# Patient Record
Sex: Male | Born: 1955
Health system: Southern US, Community
[De-identification: ages and names within clinical notes are randomized; demographics above are authoritative.]

## PROBLEM LIST (undated history)

## (undated) DIAGNOSIS — Z803 Family history of malignant neoplasm of breast: Secondary | ICD-10-CM

## (undated) DIAGNOSIS — G4730 Sleep apnea, unspecified: Secondary | ICD-10-CM

## (undated) DIAGNOSIS — Z800 Family history of malignant neoplasm of digestive organs: Secondary | ICD-10-CM

## (undated) DIAGNOSIS — K08109 Complete loss of teeth, unspecified cause, unspecified class: Secondary | ICD-10-CM

## (undated) DIAGNOSIS — I679 Cerebrovascular disease, unspecified: Secondary | ICD-10-CM

## (undated) DIAGNOSIS — M1990 Unspecified osteoarthritis, unspecified site: Secondary | ICD-10-CM

## (undated) DIAGNOSIS — E079 Disorder of thyroid, unspecified: Secondary | ICD-10-CM

## (undated) DIAGNOSIS — K219 Gastro-esophageal reflux disease without esophagitis: Secondary | ICD-10-CM

## (undated) DIAGNOSIS — Z720 Tobacco use: Secondary | ICD-10-CM

## (undated) DIAGNOSIS — Z8719 Personal history of other diseases of the digestive system: Secondary | ICD-10-CM

## (undated) DIAGNOSIS — Z806 Family history of leukemia: Secondary | ICD-10-CM

## (undated) DIAGNOSIS — I509 Heart failure, unspecified: Secondary | ICD-10-CM

## (undated) DIAGNOSIS — E039 Hypothyroidism, unspecified: Secondary | ICD-10-CM

## (undated) DIAGNOSIS — N400 Enlarged prostate without lower urinary tract symptoms: Secondary | ICD-10-CM

## (undated) DIAGNOSIS — Z950 Presence of cardiac pacemaker: Secondary | ICD-10-CM

## (undated) DIAGNOSIS — E669 Obesity, unspecified: Secondary | ICD-10-CM

## (undated) DIAGNOSIS — I10 Essential (primary) hypertension: Secondary | ICD-10-CM

## (undated) DIAGNOSIS — N529 Male erectile dysfunction, unspecified: Secondary | ICD-10-CM

## (undated) DIAGNOSIS — Z8041 Family history of malignant neoplasm of ovary: Secondary | ICD-10-CM

## (undated) DIAGNOSIS — I2510 Atherosclerotic heart disease of native coronary artery without angina pectoris: Secondary | ICD-10-CM

## (undated) DIAGNOSIS — G5603 Carpal tunnel syndrome, bilateral upper limbs: Principal | ICD-10-CM

## (undated) DIAGNOSIS — Z972 Presence of dental prosthetic device (complete) (partial): Secondary | ICD-10-CM

## (undated) DIAGNOSIS — E119 Type 2 diabetes mellitus without complications: Secondary | ICD-10-CM

## (undated) DIAGNOSIS — Z9989 Dependence on other enabling machines and devices: Secondary | ICD-10-CM

## (undated) DIAGNOSIS — M489 Spondylopathy, unspecified: Secondary | ICD-10-CM

## (undated) DIAGNOSIS — Z973 Presence of spectacles and contact lenses: Secondary | ICD-10-CM

## (undated) DIAGNOSIS — I495 Sick sinus syndrome: Secondary | ICD-10-CM

## (undated) DIAGNOSIS — R519 Headache, unspecified: Secondary | ICD-10-CM

## (undated) DIAGNOSIS — F32A Depression, unspecified: Secondary | ICD-10-CM

## (undated) DIAGNOSIS — Z801 Family history of malignant neoplasm of trachea, bronchus and lung: Secondary | ICD-10-CM

## (undated) DIAGNOSIS — I214 Non-ST elevation (NSTEMI) myocardial infarction: Secondary | ICD-10-CM

## (undated) DIAGNOSIS — D649 Anemia, unspecified: Secondary | ICD-10-CM

## (undated) DIAGNOSIS — E785 Hyperlipidemia, unspecified: Secondary | ICD-10-CM

## (undated) DIAGNOSIS — C61 Malignant neoplasm of prostate: Secondary | ICD-10-CM

## (undated) DIAGNOSIS — F1911 Other psychoactive substance abuse, in remission: Secondary | ICD-10-CM

## (undated) DIAGNOSIS — G5620 Lesion of ulnar nerve, unspecified upper limb: Secondary | ICD-10-CM

## (undated) DIAGNOSIS — F329 Major depressive disorder, single episode, unspecified: Secondary | ICD-10-CM

## (undated) HISTORY — DX: Family history of malignant neoplasm of breast: Z80.3

## (undated) HISTORY — DX: Family history of leukemia: Z80.6

## (undated) HISTORY — DX: Family history of malignant neoplasm of digestive organs: Z80.0

## (undated) HISTORY — DX: Family history of malignant neoplasm of trachea, bronchus and lung: Z80.1

## (undated) HISTORY — DX: Family history of malignant neoplasm of ovary: Z80.41

## (undated) HISTORY — DX: Carpal tunnel syndrome, bilateral upper limbs: G56.03

## (undated) HISTORY — DX: Lesion of ulnar nerve, unspecified upper limb: G56.20

---

## 2000-09-20 ENCOUNTER — Emergency Department (HOSPITAL_COMMUNITY): Admission: EM | Admit: 2000-09-20 | Discharge: 2000-09-20 | Payer: Self-pay | Admitting: Emergency Medicine

## 2000-10-12 ENCOUNTER — Encounter: Payer: Self-pay | Admitting: General Surgery

## 2000-10-12 ENCOUNTER — Ambulatory Visit (HOSPITAL_COMMUNITY): Admission: RE | Admit: 2000-10-12 | Discharge: 2000-10-12 | Payer: Self-pay | Admitting: General Surgery

## 2000-10-16 ENCOUNTER — Ambulatory Visit (HOSPITAL_COMMUNITY): Admission: RE | Admit: 2000-10-16 | Discharge: 2000-10-16 | Payer: Self-pay | Admitting: Internal Medicine

## 2000-10-24 ENCOUNTER — Ambulatory Visit (HOSPITAL_COMMUNITY): Admission: RE | Admit: 2000-10-24 | Discharge: 2000-10-24 | Payer: Self-pay | Admitting: General Surgery

## 2001-12-23 ENCOUNTER — Encounter: Payer: Self-pay | Admitting: *Deleted

## 2001-12-23 ENCOUNTER — Emergency Department (HOSPITAL_COMMUNITY): Admission: EM | Admit: 2001-12-23 | Discharge: 2001-12-23 | Payer: Self-pay | Admitting: *Deleted

## 2002-01-31 ENCOUNTER — Emergency Department (HOSPITAL_COMMUNITY): Admission: EM | Admit: 2002-01-31 | Discharge: 2002-01-31 | Payer: Self-pay | Admitting: Emergency Medicine

## 2002-02-14 ENCOUNTER — Encounter: Payer: Self-pay | Admitting: Internal Medicine

## 2002-02-14 ENCOUNTER — Encounter: Admission: RE | Admit: 2002-02-14 | Discharge: 2002-02-14 | Payer: Self-pay | Admitting: Internal Medicine

## 2002-02-14 ENCOUNTER — Inpatient Hospital Stay (HOSPITAL_COMMUNITY): Admission: AD | Admit: 2002-02-14 | Discharge: 2002-02-19 | Payer: Self-pay | Admitting: Internal Medicine

## 2002-02-14 ENCOUNTER — Ambulatory Visit (HOSPITAL_COMMUNITY): Admission: RE | Admit: 2002-02-14 | Discharge: 2002-02-14 | Payer: Self-pay | Admitting: Internal Medicine

## 2002-03-25 ENCOUNTER — Encounter: Admission: RE | Admit: 2002-03-25 | Discharge: 2002-03-25 | Payer: Self-pay | Admitting: Internal Medicine

## 2002-04-26 ENCOUNTER — Inpatient Hospital Stay (HOSPITAL_COMMUNITY): Admission: EM | Admit: 2002-04-26 | Discharge: 2002-04-29 | Payer: Self-pay | Admitting: Emergency Medicine

## 2002-04-26 ENCOUNTER — Encounter: Payer: Self-pay | Admitting: Emergency Medicine

## 2002-04-28 ENCOUNTER — Encounter: Payer: Self-pay | Admitting: Internal Medicine

## 2002-06-18 ENCOUNTER — Ambulatory Visit (HOSPITAL_COMMUNITY): Admission: RE | Admit: 2002-06-18 | Discharge: 2002-06-18 | Payer: Self-pay | Admitting: Internal Medicine

## 2002-08-27 ENCOUNTER — Encounter: Admission: RE | Admit: 2002-08-27 | Discharge: 2002-08-27 | Payer: Self-pay | Admitting: Internal Medicine

## 2002-09-01 ENCOUNTER — Encounter: Payer: Self-pay | Admitting: *Deleted

## 2002-09-01 ENCOUNTER — Ambulatory Visit (HOSPITAL_COMMUNITY): Admission: RE | Admit: 2002-09-01 | Discharge: 2002-09-01 | Payer: Self-pay | Admitting: *Deleted

## 2002-11-26 ENCOUNTER — Encounter: Admission: RE | Admit: 2002-11-26 | Discharge: 2002-11-26 | Payer: Self-pay | Admitting: Internal Medicine

## 2003-01-27 ENCOUNTER — Encounter: Admission: RE | Admit: 2003-01-27 | Discharge: 2003-01-27 | Payer: Self-pay | Admitting: Internal Medicine

## 2003-03-11 ENCOUNTER — Encounter: Admission: RE | Admit: 2003-03-11 | Discharge: 2003-03-11 | Payer: Self-pay | Admitting: Internal Medicine

## 2003-06-06 DIAGNOSIS — I2510 Atherosclerotic heart disease of native coronary artery without angina pectoris: Secondary | ICD-10-CM

## 2003-06-06 HISTORY — PX: RADIOFREQUENCY ABLATION: SHX2290

## 2003-06-06 HISTORY — DX: Atherosclerotic heart disease of native coronary artery without angina pectoris: I25.10

## 2003-12-18 ENCOUNTER — Ambulatory Visit (HOSPITAL_COMMUNITY): Admission: RE | Admit: 2003-12-18 | Discharge: 2003-12-19 | Payer: Self-pay | Admitting: Internal Medicine

## 2004-01-11 ENCOUNTER — Encounter: Admission: RE | Admit: 2004-01-11 | Discharge: 2004-01-11 | Payer: Self-pay | Admitting: Internal Medicine

## 2004-01-26 ENCOUNTER — Encounter: Admission: RE | Admit: 2004-01-26 | Discharge: 2004-01-26 | Payer: Self-pay | Admitting: Internal Medicine

## 2004-03-01 ENCOUNTER — Ambulatory Visit: Payer: Self-pay | Admitting: Internal Medicine

## 2004-03-07 ENCOUNTER — Ambulatory Visit: Payer: Self-pay | Admitting: Internal Medicine

## 2004-03-08 ENCOUNTER — Encounter: Admission: RE | Admit: 2004-03-08 | Discharge: 2004-03-08 | Payer: Self-pay | Admitting: Nephrology

## 2004-03-30 ENCOUNTER — Ambulatory Visit: Payer: Self-pay | Admitting: Internal Medicine

## 2004-05-05 ENCOUNTER — Ambulatory Visit: Payer: Self-pay | Admitting: Internal Medicine

## 2004-05-12 ENCOUNTER — Ambulatory Visit: Payer: Self-pay | Admitting: Cardiology

## 2004-05-12 ENCOUNTER — Ambulatory Visit: Payer: Self-pay | Admitting: Internal Medicine

## 2004-05-17 ENCOUNTER — Ambulatory Visit: Payer: Self-pay | Admitting: Internal Medicine

## 2004-05-18 ENCOUNTER — Inpatient Hospital Stay (HOSPITAL_BASED_OUTPATIENT_CLINIC_OR_DEPARTMENT_OTHER): Admission: RE | Admit: 2004-05-18 | Discharge: 2004-05-18 | Payer: Self-pay | Admitting: Internal Medicine

## 2004-06-10 ENCOUNTER — Ambulatory Visit: Payer: Self-pay | Admitting: Internal Medicine

## 2004-09-13 ENCOUNTER — Ambulatory Visit: Payer: Self-pay | Admitting: Internal Medicine

## 2004-09-21 ENCOUNTER — Encounter: Admission: RE | Admit: 2004-09-21 | Discharge: 2004-12-20 | Payer: Self-pay | Admitting: Internal Medicine

## 2005-05-12 ENCOUNTER — Ambulatory Visit: Payer: Self-pay | Admitting: Internal Medicine

## 2005-05-18 ENCOUNTER — Ambulatory Visit: Payer: Self-pay | Admitting: Internal Medicine

## 2006-03-15 ENCOUNTER — Ambulatory Visit: Payer: Self-pay | Admitting: Internal Medicine

## 2006-03-19 ENCOUNTER — Ambulatory Visit: Payer: Self-pay | Admitting: Internal Medicine

## 2006-03-19 ENCOUNTER — Ambulatory Visit (HOSPITAL_COMMUNITY): Admission: RE | Admit: 2006-03-19 | Discharge: 2006-03-19 | Payer: Self-pay | Admitting: Internal Medicine

## 2006-03-19 ENCOUNTER — Encounter (INDEPENDENT_AMBULATORY_CARE_PROVIDER_SITE_OTHER): Payer: Self-pay | Admitting: Internal Medicine

## 2006-03-19 LAB — CONVERTED CEMR LAB
ALT: 8 units/L (ref 0–40)
AST: 14 units/L (ref 0–37)
Albumin: 4.2 g/dL (ref 3.5–5.2)
Alkaline Phosphatase: 76 units/L (ref 39–117)
BUN: 11 mg/dL (ref 6–23)
CO2: 27 meq/L (ref 19–32)
Calcium: 8.9 mg/dL (ref 8.4–10.5)
Chloride: 104 meq/L (ref 96–112)
Cholesterol: 205 mg/dL — ABNORMAL HIGH (ref 0–200)
Creatinine, Ser: 1.12 mg/dL (ref 0.40–1.50)
Glucose, Bld: 80 mg/dL (ref 70–99)
HDL: 41 mg/dL (ref >39)
LDL Cholesterol: 143 mg/dL — ABNORMAL HIGH (ref 0–99)
Potassium: 3.7 meq/L (ref 3.5–5.3)
Sodium: 140 meq/L (ref 135–145)
Total Bilirubin: 0.8 mg/dL (ref 0.3–1.2)
Total CHOL/HDL Ratio: 5
Total Protein: 7.6 g/dL (ref 6.0–8.3)
Triglycerides: 104 mg/dL (ref <150)
VLDL: 21 mg/dL (ref 0–40)

## 2006-04-02 ENCOUNTER — Ambulatory Visit (HOSPITAL_COMMUNITY): Admission: RE | Admit: 2006-04-02 | Discharge: 2006-04-02 | Payer: Self-pay | Admitting: Gastroenterology

## 2006-04-23 DIAGNOSIS — F329 Major depressive disorder, single episode, unspecified: Secondary | ICD-10-CM | POA: Insufficient documentation

## 2006-04-23 DIAGNOSIS — K219 Gastro-esophageal reflux disease without esophagitis: Secondary | ICD-10-CM | POA: Insufficient documentation

## 2006-04-23 DIAGNOSIS — R55 Syncope and collapse: Secondary | ICD-10-CM | POA: Insufficient documentation

## 2006-05-11 DIAGNOSIS — N400 Enlarged prostate without lower urinary tract symptoms: Secondary | ICD-10-CM | POA: Insufficient documentation

## 2006-05-11 DIAGNOSIS — F1210 Cannabis abuse, uncomplicated: Secondary | ICD-10-CM | POA: Insufficient documentation

## 2006-05-11 DIAGNOSIS — F1410 Cocaine abuse, uncomplicated: Secondary | ICD-10-CM | POA: Insufficient documentation

## 2007-06-07 ENCOUNTER — Emergency Department (HOSPITAL_COMMUNITY): Admission: EM | Admit: 2007-06-07 | Discharge: 2007-06-08 | Payer: Self-pay | Admitting: Emergency Medicine

## 2008-01-28 ENCOUNTER — Encounter (INDEPENDENT_AMBULATORY_CARE_PROVIDER_SITE_OTHER): Payer: Self-pay | Admitting: Internal Medicine

## 2008-01-28 ENCOUNTER — Ambulatory Visit: Payer: Self-pay | Admitting: *Deleted

## 2008-01-28 DIAGNOSIS — G609 Hereditary and idiopathic neuropathy, unspecified: Secondary | ICD-10-CM | POA: Insufficient documentation

## 2008-01-28 LAB — CONVERTED CEMR LAB: Hgb A1c MFr Bld: 5.1 %

## 2008-01-29 ENCOUNTER — Encounter (INDEPENDENT_AMBULATORY_CARE_PROVIDER_SITE_OTHER): Payer: Self-pay | Admitting: Internal Medicine

## 2008-01-31 LAB — CONVERTED CEMR LAB
ALT: 10 units/L (ref 0–53)
AST: 12 units/L (ref 0–37)
Albumin: 4.3 g/dL (ref 3.5–5.2)
Alkaline Phosphatase: 85 units/L (ref 39–117)
BUN: 10 mg/dL (ref 6–23)
Basophils Absolute: 0.1 10*3/uL (ref 0.0–0.1)
Basophils Relative: 1 % (ref 0–1)
Bilirubin Urine: NEGATIVE
CO2: 22 meq/L (ref 19–32)
Calcium: 9.2 mg/dL (ref 8.4–10.5)
Chlamydia, Swab/Urine, PCR: NEGATIVE
Chloride: 105 meq/L (ref 96–112)
Creatinine, Ser: 1.35 mg/dL (ref 0.40–1.50)
Eosinophils Absolute: 0.1 10*3/uL (ref 0.0–0.7)
Eosinophils Relative: 1 % (ref 0–5)
GC Probe Amp, Urine: NEGATIVE
Glucose, Bld: 82 mg/dL (ref 70–99)
HCT: 50.8 % (ref 39.0–52.0)
HCV Ab: NEGATIVE
Hemoglobin, Urine: NEGATIVE
Hemoglobin: 17.8 g/dL — ABNORMAL HIGH (ref 13.0–17.0)
Hepatitis B Surface Ag: NEGATIVE
Ketones, ur: NEGATIVE mg/dL
Lymphocytes Relative: 32 % (ref 12–46)
Lymphs Abs: 3.1 10*3/uL (ref 0.7–4.0)
MCHC: 35 g/dL (ref 30.0–36.0)
MCV: 92.9 fL (ref 78.0–100.0)
Monocytes Absolute: 0.9 10*3/uL (ref 0.1–1.0)
Monocytes Relative: 9 % (ref 3–12)
Neutro Abs: 5.5 10*3/uL (ref 1.7–7.7)
Neutrophils Relative %: 57 % (ref 43–77)
Nitrite: NEGATIVE
Platelets: 210 10*3/uL (ref 150–400)
Potassium: 4.5 meq/L (ref 3.5–5.3)
Protein, ur: NEGATIVE mg/dL
RBC: 5.47 M/uL (ref 4.22–5.81)
RDW: 13.5 % (ref 11.5–15.5)
Sodium: 139 meq/L (ref 135–145)
Specific Gravity, Urine: 1.013 (ref 1.005–1.03)
Total Bilirubin: 0.9 mg/dL (ref 0.3–1.2)
Total Protein: 7.7 g/dL (ref 6.0–8.3)
Urine Glucose: NEGATIVE mg/dL
Urobilinogen, UA: 1 (ref 0.0–1.0)
Vitamin B-12: 671 pg/mL (ref 211–911)
WBC: 9.8 10*3/uL (ref 4.0–10.5)
pH: 7 (ref 5.0–8.0)

## 2008-02-18 ENCOUNTER — Encounter (INDEPENDENT_AMBULATORY_CARE_PROVIDER_SITE_OTHER): Payer: Self-pay | Admitting: Internal Medicine

## 2008-05-27 ENCOUNTER — Encounter (INDEPENDENT_AMBULATORY_CARE_PROVIDER_SITE_OTHER): Payer: Self-pay | Admitting: Internal Medicine

## 2008-06-03 ENCOUNTER — Ambulatory Visit: Payer: Self-pay | Admitting: Infectious Diseases

## 2008-06-03 ENCOUNTER — Encounter (INDEPENDENT_AMBULATORY_CARE_PROVIDER_SITE_OTHER): Payer: Self-pay | Admitting: *Deleted

## 2008-06-03 DIAGNOSIS — I471 Supraventricular tachycardia: Secondary | ICD-10-CM | POA: Insufficient documentation

## 2008-06-03 LAB — CONVERTED CEMR LAB
ALT: 11 units/L (ref 0–53)
AST: 15 units/L (ref 0–37)
Albumin: 4.4 g/dL (ref 3.5–5.2)
Alkaline Phosphatase: 84 units/L (ref 39–117)
Amphetamine Screen, Ur: NEGATIVE
BUN: 12 mg/dL (ref 6–23)
Barbiturate Quant, Ur: NEGATIVE
Basophils Absolute: 0.1 10*3/uL (ref 0.0–0.1)
Basophils Relative: 1 % (ref 0–1)
Benzodiazepines.: NEGATIVE
CO2: 19 meq/L (ref 19–32)
Calcium: 9.4 mg/dL (ref 8.4–10.5)
Chloride: 100 meq/L (ref 96–112)
Cocaine Metabolites: POSITIVE — AB
Creatinine, Ser: 1.33 mg/dL (ref 0.40–1.50)
Creatinine,U: 205.6 mg/dL
Eosinophils Absolute: 0.3 10*3/uL (ref 0.0–0.7)
Eosinophils Relative: 3 % (ref 0–5)
Glucose, Bld: 102 mg/dL — ABNORMAL HIGH (ref 70–99)
HCT: 50.4 % (ref 39.0–52.0)
Hemoglobin: 17.2 g/dL — ABNORMAL HIGH (ref 13.0–17.0)
Lymphocytes Relative: 39 % (ref 12–46)
Lymphs Abs: 3.2 10*3/uL (ref 0.7–4.0)
MCHC: 34.1 g/dL (ref 30.0–36.0)
MCV: 94.2 fL (ref 78.0–100.0)
Marijuana Metabolite: POSITIVE — AB
Methadone: NEGATIVE
Monocytes Absolute: 0.7 10*3/uL (ref 0.1–1.0)
Monocytes Relative: 9 % (ref 3–12)
Neutro Abs: 3.9 10*3/uL (ref 1.7–7.7)
Neutrophils Relative %: 48 % (ref 43–77)
Opiates: NEGATIVE
Phencyclidine (PCP): NEGATIVE
Platelets: 232 10*3/uL (ref 150–400)
Potassium: 4.5 meq/L (ref 3.5–5.3)
Propoxyphene: NEGATIVE
RBC: 5.35 M/uL (ref 4.22–5.81)
RDW: 13.5 % (ref 11.5–15.5)
Sodium: 137 meq/L (ref 135–145)
TSH: 1.072 microintl units/mL (ref 0.350–4.50)
Total Bilirubin: 0.5 mg/dL (ref 0.3–1.2)
Total Protein: 7.9 g/dL (ref 6.0–8.3)
WBC: 8.2 10*3/uL (ref 4.0–10.5)

## 2008-06-04 ENCOUNTER — Ambulatory Visit (HOSPITAL_COMMUNITY): Admission: RE | Admit: 2008-06-04 | Discharge: 2008-06-04 | Payer: Self-pay | Admitting: Infectious Diseases

## 2008-06-05 DIAGNOSIS — I679 Cerebrovascular disease, unspecified: Secondary | ICD-10-CM

## 2008-06-05 HISTORY — DX: Cerebrovascular disease, unspecified: I67.9

## 2008-06-11 ENCOUNTER — Telehealth (INDEPENDENT_AMBULATORY_CARE_PROVIDER_SITE_OTHER): Payer: Self-pay | Admitting: Internal Medicine

## 2008-06-11 ENCOUNTER — Encounter (INDEPENDENT_AMBULATORY_CARE_PROVIDER_SITE_OTHER): Payer: Self-pay | Admitting: Internal Medicine

## 2008-06-11 ENCOUNTER — Ambulatory Visit: Payer: Self-pay | Admitting: Vascular Surgery

## 2008-06-11 ENCOUNTER — Ambulatory Visit (HOSPITAL_COMMUNITY): Admission: RE | Admit: 2008-06-11 | Discharge: 2008-06-11 | Payer: Self-pay | Admitting: Infectious Diseases

## 2008-06-11 ENCOUNTER — Encounter: Payer: Self-pay | Admitting: Infectious Diseases

## 2008-06-11 ENCOUNTER — Ambulatory Visit: Payer: Self-pay | Admitting: Cardiology

## 2008-06-12 ENCOUNTER — Encounter (INDEPENDENT_AMBULATORY_CARE_PROVIDER_SITE_OTHER): Payer: Self-pay | Admitting: *Deleted

## 2008-06-12 ENCOUNTER — Ambulatory Visit: Payer: Self-pay | Admitting: Infectious Disease

## 2008-06-17 ENCOUNTER — Ambulatory Visit: Payer: Self-pay | Admitting: Vascular Surgery

## 2008-06-17 ENCOUNTER — Encounter: Payer: Self-pay | Admitting: Infectious Disease

## 2008-06-17 LAB — CONVERTED CEMR LAB
Cholesterol: 164 mg/dL
HDL: 47 mg/dL
LDL Cholesterol: 104 mg/dL — ABNORMAL HIGH
Total CHOL/HDL Ratio: 3.5
Triglycerides: 65 mg/dL
VLDL: 13 mg/dL

## 2008-06-18 ENCOUNTER — Telehealth: Payer: Self-pay | Admitting: *Deleted

## 2008-06-22 ENCOUNTER — Ambulatory Visit (HOSPITAL_COMMUNITY): Admission: RE | Admit: 2008-06-22 | Discharge: 2008-06-22 | Payer: Self-pay | Admitting: Infectious Disease

## 2008-07-01 ENCOUNTER — Ambulatory Visit: Payer: Self-pay | Admitting: Internal Medicine

## 2008-07-06 ENCOUNTER — Ambulatory Visit: Payer: Self-pay

## 2008-07-09 ENCOUNTER — Ambulatory Visit: Payer: Self-pay | Admitting: Internal Medicine

## 2008-07-09 LAB — CONVERTED CEMR LAB
BUN: 17 mg/dL (ref 6–23)
Basophils Absolute: 0 10*3/uL (ref 0.0–0.1)
Basophils Relative: 0 % (ref 0.0–3.0)
CO2: 0 meq/L — CL (ref 19–32)
Calcium: 9.2 mg/dL (ref 8.4–10.5)
Chloride: 110 meq/L (ref 96–112)
Creatinine, Ser: 1.5 mg/dL (ref 0.4–1.5)
Eosinophils Absolute: 0.3 10*3/uL (ref 0.0–0.7)
Eosinophils Relative: 3.1 % (ref 0.0–5.0)
GFR calc Af Amer: 63 mL/min
GFR calc non Af Amer: 52 mL/min
Glucose, Bld: 97 mg/dL (ref 70–99)
HCT: 50.6 % (ref 39.0–52.0)
Hemoglobin: 17.3 g/dL — ABNORMAL HIGH (ref 13.0–17.0)
INR: 1.1 — ABNORMAL HIGH (ref 0.8–1.0)
Lymphocytes Relative: 35.9 % (ref 12.0–46.0)
MCHC: 34.1 g/dL (ref 30.0–36.0)
MCV: 96.1 fL (ref 78.0–100.0)
Monocytes Absolute: 0.7 10*3/uL (ref 0.1–1.0)
Monocytes Relative: 7.9 % (ref 3.0–12.0)
Neutro Abs: 4.4 10*3/uL (ref 1.4–7.7)
Neutrophils Relative %: 53.1 % (ref 43.0–77.0)
Platelets: 186 10*3/uL (ref 150–400)
Potassium: 4.6 meq/L (ref 3.5–5.1)
Prothrombin Time: 11.6 s (ref 10.9–13.3)
RBC: 5.26 M/uL (ref 4.22–5.81)
RDW: 12.7 % (ref 11.5–14.6)
Sodium: 140 meq/L (ref 135–145)
WBC: 8.4 10*3/uL (ref 4.5–10.5)
aPTT: 33 s — ABNORMAL HIGH (ref 21.7–29.8)

## 2008-07-13 ENCOUNTER — Inpatient Hospital Stay (HOSPITAL_BASED_OUTPATIENT_CLINIC_OR_DEPARTMENT_OTHER): Admission: RE | Admit: 2008-07-13 | Discharge: 2008-07-13 | Payer: Self-pay | Admitting: Internal Medicine

## 2008-07-13 ENCOUNTER — Ambulatory Visit: Payer: Self-pay | Admitting: Internal Medicine

## 2008-07-17 ENCOUNTER — Ambulatory Visit: Payer: Self-pay | Admitting: Vascular Surgery

## 2008-07-17 ENCOUNTER — Encounter: Payer: Self-pay | Admitting: Infectious Disease

## 2008-07-30 ENCOUNTER — Encounter: Payer: Self-pay | Admitting: Vascular Surgery

## 2008-07-30 ENCOUNTER — Inpatient Hospital Stay (HOSPITAL_COMMUNITY): Admission: RE | Admit: 2008-07-30 | Discharge: 2008-07-31 | Payer: Self-pay | Admitting: Vascular Surgery

## 2008-07-30 ENCOUNTER — Ambulatory Visit: Payer: Self-pay | Admitting: Vascular Surgery

## 2008-07-30 HISTORY — PX: CAROTID ENDARTERECTOMY: SUR193

## 2008-08-01 ENCOUNTER — Inpatient Hospital Stay (HOSPITAL_COMMUNITY): Admission: EM | Admit: 2008-08-01 | Discharge: 2008-08-02 | Payer: Self-pay | Admitting: Emergency Medicine

## 2008-08-14 ENCOUNTER — Encounter (INDEPENDENT_AMBULATORY_CARE_PROVIDER_SITE_OTHER): Payer: Self-pay | Admitting: Internal Medicine

## 2008-08-14 ENCOUNTER — Ambulatory Visit: Payer: Self-pay | Admitting: Vascular Surgery

## 2008-08-26 ENCOUNTER — Encounter: Payer: Self-pay | Admitting: Internal Medicine

## 2008-08-26 ENCOUNTER — Ambulatory Visit: Payer: Self-pay | Admitting: Internal Medicine

## 2008-08-31 ENCOUNTER — Encounter (INDEPENDENT_AMBULATORY_CARE_PROVIDER_SITE_OTHER): Payer: Self-pay | Admitting: Internal Medicine

## 2008-09-21 ENCOUNTER — Encounter (INDEPENDENT_AMBULATORY_CARE_PROVIDER_SITE_OTHER): Payer: Self-pay | Admitting: Internal Medicine

## 2008-11-04 ENCOUNTER — Encounter (INDEPENDENT_AMBULATORY_CARE_PROVIDER_SITE_OTHER): Payer: Self-pay | Admitting: Internal Medicine

## 2008-11-04 ENCOUNTER — Ambulatory Visit: Payer: Self-pay | Admitting: Internal Medicine

## 2009-01-05 ENCOUNTER — Telehealth: Payer: Self-pay | Admitting: Internal Medicine

## 2009-01-13 ENCOUNTER — Encounter: Admission: RE | Admit: 2009-01-13 | Discharge: 2009-01-13 | Payer: Self-pay | Admitting: Family Medicine

## 2009-02-12 ENCOUNTER — Ambulatory Visit: Payer: Self-pay | Admitting: Vascular Surgery

## 2009-02-22 ENCOUNTER — Ambulatory Visit: Payer: Self-pay | Admitting: Internal Medicine

## 2009-02-24 ENCOUNTER — Ambulatory Visit: Payer: Self-pay | Admitting: Internal Medicine

## 2009-03-15 LAB — CONVERTED CEMR LAB
ALT: 15 units/L (ref 0–53)
AST: 21 units/L (ref 0–37)
Albumin: 3.7 g/dL (ref 3.5–5.2)
Alkaline Phosphatase: 76 units/L (ref 39–117)
Bilirubin, Direct: 0.1 mg/dL (ref 0.0–0.3)
Cholesterol: 123 mg/dL (ref 0–200)
HDL: 37.7 mg/dL — ABNORMAL LOW (ref >39.00)
LDL Cholesterol: 74 mg/dL (ref 0–99)
Total Bilirubin: 0.9 mg/dL (ref 0.3–1.2)
Total CHOL/HDL Ratio: 3
Total Protein: 7.5 g/dL (ref 6.0–8.3)
Triglycerides: 58 mg/dL (ref 0.0–149.0)
VLDL: 11.6 mg/dL (ref 0.0–40.0)

## 2009-03-26 ENCOUNTER — Encounter: Payer: Self-pay | Admitting: Internal Medicine

## 2009-05-21 ENCOUNTER — Ambulatory Visit (HOSPITAL_BASED_OUTPATIENT_CLINIC_OR_DEPARTMENT_OTHER): Admission: RE | Admit: 2009-05-21 | Discharge: 2009-05-21 | Payer: Self-pay | Admitting: Ophthalmology

## 2009-06-07 ENCOUNTER — Telehealth: Payer: Self-pay | Admitting: Internal Medicine

## 2009-09-28 ENCOUNTER — Ambulatory Visit (HOSPITAL_COMMUNITY): Admission: RE | Admit: 2009-09-28 | Discharge: 2009-09-28 | Payer: Self-pay | Admitting: Family Medicine

## 2009-11-05 ENCOUNTER — Encounter: Payer: Self-pay | Admitting: Internal Medicine

## 2009-11-05 ENCOUNTER — Ambulatory Visit: Payer: Self-pay | Admitting: Internal Medicine

## 2010-01-12 ENCOUNTER — Ambulatory Visit: Payer: Self-pay | Admitting: Cardiology

## 2010-01-12 ENCOUNTER — Encounter (INDEPENDENT_AMBULATORY_CARE_PROVIDER_SITE_OTHER): Payer: Self-pay | Admitting: Internal Medicine

## 2010-01-12 ENCOUNTER — Inpatient Hospital Stay (HOSPITAL_COMMUNITY): Admission: EM | Admit: 2010-01-12 | Discharge: 2010-01-13 | Payer: Self-pay | Admitting: Emergency Medicine

## 2010-01-12 ENCOUNTER — Ambulatory Visit: Payer: Self-pay | Admitting: Vascular Surgery

## 2010-02-03 ENCOUNTER — Ambulatory Visit: Payer: Self-pay

## 2010-02-03 ENCOUNTER — Encounter: Payer: Self-pay | Admitting: Internal Medicine

## 2010-02-03 ENCOUNTER — Ambulatory Visit (HOSPITAL_COMMUNITY): Admission: RE | Admit: 2010-02-03 | Discharge: 2010-02-03 | Payer: Self-pay | Admitting: Cardiovascular Disease

## 2010-02-03 ENCOUNTER — Ambulatory Visit: Payer: Self-pay | Admitting: Cardiology

## 2010-02-09 ENCOUNTER — Ambulatory Visit: Payer: Self-pay | Admitting: Internal Medicine

## 2010-02-24 ENCOUNTER — Encounter: Payer: Self-pay | Admitting: Internal Medicine

## 2010-06-27 ENCOUNTER — Encounter: Payer: Self-pay | Admitting: Internal Medicine

## 2010-07-07 NOTE — Letter (Signed)
Summary: Medical Clearance for Dental Services  Medical Clearance for Dental Services   Imported By: Kassie Mends 04/27/2009 13:45:35  _____________________________________________________________________  External Attachment:    Type:   Image     Comment:   External Document

## 2010-07-07 NOTE — Progress Notes (Signed)
Summary: Peter Dunlap - 2-D echo  Phone Note Call from Patient   Caller: [facility] Reason for Call: Lab or Test Results Summary of Call: Peter Dunlap called with prelim report.  She is faxing report to IM now. Pt was released home. Initial call taken by: Tomasita Morrow RN,  June 11, 2008 9:54 AM  Follow-up for Phone Call        ECHO results routed to ordering physician, Dr [name]. Follow-up by: Ned Grace MD,  June 11, 2008 12:14 PM

## 2010-07-07 NOTE — Assessment & Plan Note (Signed)
Summary: EPH/ APPT IS 9A.M./F/U ON STRESS ECHO ON 9/1 GD   Visit Type:  Follow-up Primary Provider:  Valetta Close MD  CC:  f/u stress echo no cardiac complaints.  History of Present Illness: Mr. Blackwelder returns today for followup.  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  He has longstanding HTN heart disease and diastolic heart failure.  He denies peripheral edema.  He has continued to try and stop smoking.  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  His blood pressure has recently been treated more aggressively and is better. He was hospitalized several weeks ago with c/p and had a negative stress echo.  He is trying to walk but has been slowed down by the hot weather.   Current Medications (verified): 1)  Zocor 80 Mg Tabs (Simvastatin) .... Take 1 Tablet By Mouth Once A Day 2)  Buffered Aspirin 325 Mg Tabs (Aspirin Buf(Cacarb-Mgcarb-Mgo)) .[name].. 1 Once Daily 3)  Hydrochlorothiazide 25 Mg Tabs (Hydrochlorothiazide) .... Take 1 Tablet By Mouth Once Daily 4)  Omeprazole 20 Mg Cpdr (Omeprazole) .... Once Daily 5)  Naproxen 500 Mg Tabs (Naproxen) .... As Needed 6)  Nitrostat 0.4 Mg Subl (Nitroglycerin) .[name].. 1 Tablet Under Tongue At Onset of Chest Pain; You May Repeat Every 5 Minutes For Up To 3 Doses. 7)  Clonidine Hcl 0.1 Mg Tabs (Clonidine Hcl) .... Take One Tablet By Mouth Two Times A Day 8)  Amlodipine Besylate 5 Mg Tabs (Amlodipine Besylate) .[name].. 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  The patient denies syncope, dyspnea on exertion, and peripheral edema.    Vital Signs:  Patient profile:   55 year old male Height:      72 inches Weight:      292 pounds BMI:     39.75 Pulse rate:   76 / minute Pulse rhythm:   regular BP sitting:   118 / 72  (left arm) Cuff size:   large  Vitals Entered By: Scherrie Bateman, LPN (February 09, 2010 8:30 AM)  Physical Exam  General:  Obese middle aged man NAD. Head:  normocephalic and atraumatic Eyes:  PERRLA/EOM intact; conjunctiva and lids normal. Mouth:  Teeth, gums and palate normal. Oral mucosa normal. Neck:  Well healed CEA incision on the right. Lungs:  Clear bilaterally with no wheezes, rales, or rhonchi. Heart:  RRR with normal S1 and S2.  PMI is not enlarged.  NO murmurs. Abdomen:  Bowel sounds positive; abdomen soft and non-tender without masses, organomegaly, or hernias noted. No hepatosplenomegaly. Msk:  Back normal, normal gait. Muscle strength and tone normal. Pulses:  pulses normal in all 4 extremities Extremities:  No clubbing or cyanosis. No edema. Neurologic:  Alert and oriented x 3.   Impression & Recommendations:  Problem # 1:  MIXED HYPERLIPIDEMIA (ICD-272.2) I have asked him to decrease  his simvastatin to 40 mg daily. His updated medication list for this problem includes:    Zocor 80 Mg Tabs (Simvastatin) .[name]... Take 1 tablet by mouth once a day  Problem # 2:  HYPERTENSION (ICD-401.9) He c/o increased urination and I have asked him to reduce the HCTZ to half tablet a day. His updated  medication list for this problem includes:    Buffered Aspirin 325 Mg Tabs (Aspirin buf(cacarb-mgcarb-mgo)) .[name]... 1 once daily    Hydrochlorothiazide 25 Mg Tabs (Hydrochlorothiazide) .[name]... Take 1 tablet by mouth once daily    Clonidine Hcl 0.1 Mg Tabs (Clonidine hcl) .[name]... Take one tablet by mouth two times a day    Amlodipine Besylate 5 Mg Tabs (Amlodipine besylate) .[name]... 1 once daily  Problem # 3:  CORONARY ARTERY DISEASE (ICD-414.00) His anginal symptoms are controlled.  I have asked him to exercise regularly.  Continue meds as below. His updated medication list for this problem includes:    Buffered Aspirin 325 Mg Tabs (Aspirin buf(cacarb-mgcarb-mgo)) .[name]... 1 once daily    Nitrostat 0.4 Mg Subl (Nitroglycerin) .[name]... 1 tablet under tongue at onset of chest pain; you may repeat every 5 minutes for up to 3 doses.    Amlodipine Besylate 5 Mg Tabs (Amlodipine besylate) .[name]... 1 once daily  Patient Instructions: 1)  Your physician recommends that you schedule a follow-up appointment in: 6 MONTHS WITH DR [name] 2)  Your physician has recommended you make the following change in your medication: DECREASE HCTZ 25 MG TO 1/2 TAB once daily 3)  AND DECREASE ZIMVASTATIN TO 40 MG once daily

## 2010-07-07 NOTE — Miscellaneous (Signed)
Summary: HIPAA Restrictions  HIPAA Restrictions   Imported By: Florinda Marker 11/05/2008 14:35:27  _____________________________________________________________________  External Attachment:    Type:   Image     Comment:   External Document

## 2010-07-07 NOTE — Letter (Signed)
Summary: [facility] C  [facility] - Medical Clearance   Imported By: Marylou Mccoy 03/25/2010 15:01:23  _____________________________________________________________________  External Attachment:    Type:   Image     Comment:   External Document

## 2010-07-07 NOTE — Assessment & Plan Note (Signed)
Summary: [facility]   Visit Type:  Follow-up Primary Provider:  Valetta Close MD   History of Present Illness: Mr. Diebold returns today for followup.  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  He has longstanding HTN heart disease and diastolic heart failure.  He denies peripheral edema.  He has continued to try and stop smoking.  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  His blood pressure has recently been treated more aggressively and is better.  Current Medications (verified): 1)  Zocor 80 Mg Tabs (Simvastatin) .... Take 1 Tablet By Mouth Once A Day 2)  Aspirin 81 Mg Tbec (Aspirin) .... Take 1 Tablet By Mouth Once Daily 3)  Hydrochlorothiazide 25 Mg Tabs (Hydrochlorothiazide) .... Take 1 Tablet By Mouth Once Daily 4)  Omeprazole 20 Mg Cpdr (Omeprazole) .... Once Daily 5)  Naproxen 500 Mg Tabs (Naproxen) .... As Needed 6)  Nitrostat 0.4 Mg Subl (Nitroglycerin) .[name].. 1 Tablet Under Tongue At Onset of Chest Pain; You May Repeat Every 5 Minutes For Up To 3 Doses. 7)  Clonidine Hcl 0.1 Mg Tabs (Clonidine Hcl) .... 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    Vital Signs:  Patient profile:   55 year old male Height:      72 inches Weight:      287 pounds BMI:     39.06 Pulse rate:   86 / minute BP sitting:   120 / 88  (left arm)  Vitals Entered By: Laurance Flatten CMA (November 05, 2009 2:05 PM)  Physical Exam  General:  Obese middle aged man NAD. Head:  normocephalic and atraumatic Eyes:  PERRLA/EOM intact; conjunctiva and lids normal. Ears:  no external deformities.   Mouth:  Teeth, gums and palate normal. Oral mucosa normal. Neck:  Well healed CEA incision on the right. Chest Wall:  no deformities or breast masses noted Lungs:  Clear bilaterally with no wheezes, rales, or rhonchi. Heart:  RRR with normal S1 and S2.  PMI is not enlarged.  NO murmurs. Abdomen:  Bowel sounds positive; abdomen soft and non-tender without masses, organomegaly, or hernias noted. No hepatosplenomegaly. Msk:  Back normal, normal gait. Muscle strength and tone normal. Pulses:  pulses normal in all 4 extremities Extremities:  No clubbing or cyanosis. No edema. Neurologic:  Alert and oriented x 3.   EKG  Procedure date:  11/05/2009  Findings:      Normal sinus rhythm with rate of:89.   Left ventricular hypertrophy.    Impression & Recommendations:  Problem # 1:  SUPRAVENTRICULAR TACHYCARDIA, HX OF (ICD-V12.59) He has had no additional symptoms.  Will followup. His updated medication list for this problem includes:    Aspirin 81 Mg Tbec (Aspirin) .[name]... Take 1 tablet by mouth once daily    Nitrostat  0.4 Mg Subl (Nitroglycerin) .[name]... 1 tablet under tongue at onset of chest pain; you may repeat every 5 minutes for up to 3 doses.  Problem # 2:  MIXED HYPERLIPIDEMIA (ICD-272.2) He will continue his high dose lipitor. His updated medication list for this problem includes:    Zocor 80 Mg Tabs  (Simvastatin) .[name]... Take 1 tablet by mouth once a day  Problem # 3:  HYPERTENSION (ICD-401.9) His blood pressure has been well controlled.  Continue current meds as below. His updated medication list for this problem includes:    Aspirin 81 Mg Tbec (Aspirin) .[name]... Take 1 tablet by mouth once daily    Hydrochlorothiazide 25 Mg Tabs (Hydrochlorothiazide) .[name]... Take 1 tablet by mouth once daily    Clonidine Hcl 0.1 Mg Tabs (Clonidine hcl) .[name]... Take one tablet by mouth two times a day  Problem # 4:  TOBACCO ABUSE (ICD-305.1) I have discussed smoking cessation.  He will try to stop.  Patient Instructions: 1)  Your physician recommends that you schedule a follow-up appointment in: 12 months with Dr [name]

## 2010-07-07 NOTE — Progress Notes (Signed)
----   Converted from flag ---- ---- 06/18/2008 10:35 AM, Chinita Pester RN wrote: Talked to Peter Dunlap.  Instructed Peter Dunlap to increase Zocor to 80mg  daily and he agreed.  And informed him Dr. Noel Gerold has electronically sent new Rx. to his pharmacy.  ---- 06/17/2008 1:50 PM, Chinita Pester RN wrote: Peter Dunlap. called;no answer.  I will try again.  ---- 06/17/2008 1:17 PM, Valetta Close MD wrote: Please contact [name], and if he is willing, he should start taking zocor 80mg  by mouth once daily. He can pick up the prescription when he completes the supply of 40mg  that he has. ------------------------------

## 2010-07-07 NOTE — Letter (Signed)
Summary: [facility]: FMLA  [facility]: FMLA   Imported By: Florinda Marker 06/09/2008 15:42:37  _____________________________________________________________________  External Attachment:    Type:   Image     Comment:   External Document

## 2010-07-07 NOTE — Miscellaneous (Signed)
Summary: HIV Testing  HIV Testing   Imported By: Florinda Marker 01/29/2008 15:35:22  _____________________________________________________________________  External Attachment:    Type:   Image     Comment:   External Document

## 2010-07-07 NOTE — Progress Notes (Signed)
Summary: Refill/gh  Phone Note Refill Request Message from:  Fax from Pharmacy on January 05, 2009 9:18 AM  Refills Requested: Medication #1:  HYDROCHLOROTHIAZIDE 25 MG TABS Take 1 tablet by mouth once daily   Last Refilled: 11/28/2008  Method Requested: Electronic Initial call taken by: Angelina Ok RN,  January 05, 2009 9:19 AM  Follow-up for Phone Call        Refill approved-nurse to complete    Prescriptions: HYDROCHLOROTHIAZIDE 25 MG TABS (HYDROCHLOROTHIAZIDE) Take 1 tablet by mouth once daily  #31 x 6   Entered and Authorized by:   Vassie Loll MD   Signed by:   Vassie Loll MD on 01/05/2009   Method used:   Electronically to        [facility] [id number] (retail)       [address] [place], [place]  [address]       Ph: [phone]       Fax: [phone]   RxID:   [id number]

## 2010-07-07 NOTE — Assessment & Plan Note (Signed)
Summary: [facility]   Primary Provider:  Valetta Close MD  CC:  sob.  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  He is a very pleasant middle aged man with a history of supraventricular tachycardia.he also has a history of coronary disease.he recently underwent carotid endarterectomy.  Postoperatively he had some swelling in his neck which has resolved.he has mild chest discomfort with exertion.  He denies palpitations or other cardiac symptoms.  Current Medications (verified): 1)  Zocor 80 Mg Tabs (Simvastatin) .... Take 1 Tablet By Mouth Once A Day 2)  Aspirin 81 Mg Tbec (Aspirin) .... Take 1 Tablet By Mouth Once Daily 3)  Hydrochlorothiazide 25 Mg Tabs (Hydrochlorothiazide) .... Take 1 Tablet By Mouth Once Daily 4)  Naproxen Sodium 220 Mg Tabs (Naproxen Sodium) .... Take 1 Tablet By Mouth Two Times A Day As Needed For Lower Back Pain 5)  Nexium 40 Mg Cpdr (Esomeprazole Magnesium) .... Take 1 Tablet By Mouth Once A Day 6)  Ativan 1 Mg Tabs (Lorazepam) .... Take One Tablet By Mouth 30 Minutes Before Procedure.  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)    PERIPHERAL NEUROPATHY (ICD-356.9)    SEXUALLY TRANSMITTED DISEASE, EXPOSURE TO (ICD-V01.6)    TOBACCO ABUSE (ICD-305.1)    BENIGN PROSTATIC HYPERTROPHY (ICD-600.00)    LOW BACK PAIN (ICD-724.2)    CANNABIS ABUSE (ICD-305.20)    COCAINE ABUSE (ICD-305.60)    LOW BACK PAIN, CHRONIC (ICD-724.2)    HYPERCHOLESTEROLEMIA (ICD-272.0)    BACK PAIN (ICD-724.5)    HYPERTENSION (ICD-401.9)    SYNCOPE (ICD-780.2)    GERD (ICD-530.81)    DEPRESSION (ICD-311)    CORONARY ARTERY DISEASE (ICD-414.00)     (08/26/2008)  Past Surgical History:    Carotid Endarterectomy- 08/01/2008     Insertion of implantable loop recorder.-  06/18/2002    removal of his implantable loop recorder.05/18/2004     (08/26/2008)  Review of Systems  The patient denies fever, weight loss, hoarseness, syncope, dyspnea on exertion, peripheral edema, and prolonged cough.    Vital Signs:  Patient profile:   55 year old male Height:      72 inches Weight:      301.50 pounds BMI:     41.04 Pulse rate:   94 / minute Pulse rhythm:   regular BP sitting:   141 / 92  (left arm) Cuff size:   large  Vitals Entered By: Flonnie Overman (August 26, 2008 3:16 PM)  Physical Exam  General:  Well developed, well nourished, in no acute distress. Head:  normocephalic and atraumatic Eyes:  PERRLA/EOM intact; conjunctiva and lids normal. Mouth:  Teeth, gums and palate normal. Oral mucosa normal. Neck:  Right carotid endarterectomy scar is well healed. Chest Wall:  no deformities or breast masses noted Lungs:  Clear bilaterally to auscultation and percussion. Heart:  Non-displaced PMI, chest non-tender; regular rate and rhythm, S1, S2 without murmurs, rubs or gallops. Carotid upstroke normal, no bruit. Normal abdominal aortic size, no bruits. Femorals normal pulses, no bruits. Pedals normal pulses. No edema, no varicosities. Abdomen:  Bowel sounds positive; abdomen soft and non-tender without masses, organomegaly, or hernias noted. No hepatosplenomegaly. Msk:  Back normal, normal gait. Muscle strength and tone normal. Pulses:  pulses normal in all 4 extremities Extremities:  No clubbing or cyanosis. Neurologic:  Alert  and oriented x 3.   Impression & Recommendations:  Problem # 1:  CORONARY ARTERY DISEASE (ICD-414.00) He has minimal symptoms at present.  I encouraged regular exercise. His updated medication list for this problem includes:    Aspirin 81 Mg Tbec (Aspirin) .[name]... Take 1 tablet by mouth once daily  Problem # 2:  SUPRAVENTRICULAR TACHYCARDIA, HX OF (ICD-V12.59) He has been asymptomatic. His updated medication list for this problem  includes:    Aspirin 81 Mg Tbec (Aspirin) .[name]... Take 1 tablet by mouth once daily  Problem # 3:  CAROTID ARTERY STENOSIS, RIGHT (ICD-433.10) S/p endarterectomy without symptoms. His updated medication list for this problem includes:    Aspirin 81 Mg Tbec (Aspirin) .[name]... Take 1 tablet by mouth once daily  Problem # 4:  TOBACCO ABUSE (ICD-305.1) I encouraged smoking cessation.

## 2010-07-07 NOTE — Assessment & Plan Note (Signed)
Summary: numbness in arm and leg per Gladys[mkj]   Vital Signs:  Patient Profile:   55 Years Old Male Height:     71 inches (180.34 cm) Weight:      271.2 pounds (123.27 kg) BMI:     37.96 Temp:     98.1 degrees F (36.72 degrees C) Pulse rate:   85 / minute BP sitting:   156 / 93  (left arm)  Pt. in pain?   yes    Location:   low back    Intensity:   4  Vitals Entered By: Dorie Rank RN (January 28, 2008 3:40 PM)              Is Patient Diabetic? No Nutritional Status BMI of > 30 = obese  Have you ever been in a relationship where you felt threatened, hurt or afraid?No   Does patient need assistance? Functional Status Self care Ambulation Normal     Chief Complaint:  off meds 4-5- wants to be tested for STD's and get meds restarted- noted numbness in arms for 1 month - comes and goes.  History of Present Illness: 55 yo man with multiple medical problems:  Coronary artery disease Depression GERD Hypertension Hypercholesterolemia Cocaine abuse Canabis abuse Low back pain Tobacco abuse   Comes to the clinic after nearly two years. Has the following presenting complaints  1. Wants to be checked for STD 2. Wants meds refilled 3. Bilat. upper extremity numbness.   Patient is having a regular relationship with his girl-friend. Partner recently diagnosed of "chlamydia". Wants tested and treated. Having occasional abnormal watery discharge from his genitalia. Denies any dysuria, pain abdomen, fever, swelling, rash, joint pain, jaundice or any other systemic complaints.  Bilat. upper extremity numbness and weakness. Chronic problem but has gotten worse recently. No history of trauma. More numbness than weakness. No other focal neurological deficits. No pain in the neck.       Prior Medications Reviewed Using: Patient Recall  Updated Prior Medication List: ZOCOR 40 MG TABS (SIMVASTATIN) Take 1 tablet by mouth once daily ASPIRIN 81 MG TBEC (ASPIRIN) Take 1 tablet  by mouth once daily PROTONIX 40 MG TBEC (PANTOPRAZOLE SODIUM) Take 1 tablet by mouth once daily HYDROCHLOROTHIAZIDE 25 MG TABS (HYDROCHLOROTHIAZIDE) Take 1 tablet by mouth once daily NAPROXEN SODIUM 220 MG TABS (NAPROXEN SODIUM) Take 1 tablet by mouth two times a day as needed for lower back pain  Current Allergies: No known allergies   Past Medical History:    Reviewed history from 04/23/2006 and no changes required:       Coronary artery disease       Depression       GERD       Hypertension       Syncope       Hypercholesterolemia       Cocaine abuse       Canabis abuse       Low back pain       Benign prostatic hypertrophy       Tobacco abuse   Family History:    Family history positive for DM.   Social History:    1 ppd smoker    Drinks 1 - 2 beer a day.    Risk Factors:  Tobacco use:  current    Cigarettes:  Yes -- 1 pack(s) per day   Review of Systems      See HPI   Physical Exam  General:  alert and well-developed.   Head:     normocephalic and atraumatic.   Eyes:     vision grossly intact, pupils equal, pupils round, and pupils reactive to light.   Ears:     no external deformities.   Nose:     no external deformity.   Mouth:     pharynx pink and moist, no erythema, and no exudates.   Lungs:     normal respiratory effort and normal breath sounds.   Heart:     normal rate, regular rhythm, no murmur, no gallop, and no rub.   Abdomen:     soft and non-tender.   Msk:     normal ROM, no joint tenderness, and no joint swelling.  No cervical tenderness.  Pulses:     Normal peripheral pulses Extremities:     No pedal edema or calf swelling.  Neurologic:     alert & oriented X3, cranial nerves II-XII intact, and strength normal in all extremities.   Skin:     turgor normal, color normal, and no rashes.   Psych:     Oriented X3 and normally interactive.      Impression & Recommendations:  Problem # 1:  SEXUALLY TRANSMITTED DISEASE,  EXPOSURE TO (ICD-V01.6) Assessment: Comment Only Will go ahead and get tests to rule out GC/ chlamydia / syphilis / HIV / Hepatitis. Will offer treatment based on the result.  Orders: T-Comprehensive Metabolic Panel [id number]) T-CBC w/Diff [id number]) T-GC Probe, urine [id number]) T-Chlamydia  Probe, urine [id number]) T-Urinalysis [id number]) T-Culture, Urine ([id number]) T-Hepatitis B Surface Antigen ([id number]) T-HIV Antibody  (Reflex) [id number]) T-RPR (Syphilis) ([id number]) T-Hepatitis C Antibody ([id number])   Problem # 2:  PERIPHERAL NEUROPATHY (ICD-356.9) Assessment: Comment Only No focal deficits. 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 Will get MRI to rule out radiculopathy. Will also get A1c and B12 to rule out diabetes and vitamin deficiency. Symptomatic treatment with as needed analgesics for now.   Orders: T-Vitamin B12 [id number]) T-Hgb A1C ([id number]) MRI without Contrast (MRI w/o Contrast)   Problem # 3:  HYPERTENSION (ICD-401.9) Assessment: Comment Only Restart meds. Will do a refill.   His updated medication list for this problem includes:    Hydrochlorothiazide 25 Mg Tabs (Hydrochlorothiazide) .[name]... Take 1 tablet by mouth once daily   Problem # 4:  HYPERCHOLESTEROLEMIA (ICD-272.0) Assessment: Comment Only Restart meds. Will do a refill.  His updated medication list for this problem includes:    Zocor 40 Mg Tabs (Simvastatin) .[name]... Take 1 tablet by mouth once daily   Complete Medication List: 1)  Zocor 40 Mg Tabs (Simvastatin) .... Take 1 tablet by mouth once daily 2)  Aspirin 81 Mg Tbec (Aspirin) .... Take 1 tablet by mouth once daily 3)  Protonix 40 Mg Tbec (Pantoprazole sodium) .... Take 1 tablet by mouth once daily 4)  Hydrochlorothiazide 25 Mg Tabs (Hydrochlorothiazide) .... Take 1 tablet by mouth once daily 5)  Naproxen Sodium 220 Mg Tabs (Naproxen sodium) .... Take  1 tablet by mouth two times a day as needed for lower back pain   Patient Instructions: 1)  Please schedule a follow-up appointment in 1 month. 2)  We will check your results and if anything is wrong, will let you know. 3)  Please feel free to schedule earlier appointment if any concerns.    Prescriptions: NAPROXEN SODIUM 220 MG TABS (NAPROXEN SODIUM) Take 1 tablet by mouth two times a day as needed for lower  back pain  #31 x 3   Entered and Authorized by:   Zara Council MD   Signed by:   Zara Council MD on 01/28/2008   Method used:   Electronically to        [id number] (retail)       [address] [place], [place]  [address]       Ph: [phone]       Fax: [phone]   RxID:   [id number] HYDROCHLOROTHIAZIDE 25 MG TABS (HYDROCHLOROTHIAZIDE) Take 1 tablet by mouth once daily  #31 x 3   Entered and Authorized by:   Zara Council MD   Signed by:   Zara Council MD on 01/28/2008   Method used:   Electronically to        [id number] (retail)       [address] [place], [place]  [address]       Ph: [phone]       Fax: [phone]   RxID:   [id number] PROTONIX 40 MG TBEC (PANTOPRAZOLE SODIUM) Take 1 tablet by mouth once daily  #31 x 3   Entered and Authorized by:   Zara Council MD   Signed by:   Zara Council MD on 01/28/2008   Method used:   Electronically to        [facility] (retail)       [address] [place], [place]  [address]       Ph: [phone]       Fax: [phone]   RxID:   [id number] ASPIRIN 81 MG TBEC (ASPIRIN) Take 1 tablet by mouth once daily  #31 x 3   Entered and Authorized by:   Zara Council MD   Signed by:   Zara Council MD on 01/28/2008   Method used:   Electronically to        [facility] (retail)       [address] [place], [place]  [address]       Ph:  [phone]       Fax: [phone]   RxID:   [id number] ZOCOR 40 MG TABS (SIMVASTATIN) Take 1 tablet by mouth once daily  #31 x 3   Entered and Authorized by:   Zara Council MD   Signed by:   Zara Council MD on 01/28/2008   Method used:   Electronically to        [facility] [id number] (retail)       [address] [place], [place]  [address]       Ph: [phone]       Fax: [phone]   RxID:   [id number]  ] Laboratory Results   Blood Tests   Date/Time Received: January 28, 2008 5:03 PM  Date/Time Reported: Oren Beckmann  January 28, 2008 5:03 PM   HGBA1C: 5.1%   (Normal Range: Non-Diabetic - 3-6%   Control Diabetic - 6-8%)

## 2010-07-07 NOTE — Miscellaneous (Signed)
Summary: [facility]: Physician's Orders  [facility]: Physician's Orders   Imported By: Florinda Marker 09/01/2008 16:17:31  _____________________________________________________________________  External Attachment:    Type:   Image     Comment:   External Document

## 2010-07-07 NOTE — Progress Notes (Signed)
Summary: prior authorizaton request/gp  Phone Note Outgoing Call   Summary of Call: I called for Prior Authorization on  Nexium 40mg ; form was faxed which needs to be completed by pt's PCP.  Form placed in mailbox at the clinic. Initial call taken by: Chinita Pester RN,  June 07, 2009 2:15 PM  Follow-up for Phone Call        I will sign and complete form ASAP... Thanks

## 2010-07-07 NOTE — Assessment & Plan Note (Signed)
Summary: 1WK FU/COHEN/VS   Vital Signs:  Patient Profile:   55 Years Old Male Height:     71 inches (180.34 cm) Weight:      284.4 pounds (129.27 kg) BMI:     39.81 Temp:     97.3 degrees F (36.28 degrees C) oral Pulse rate:   75 / minute BP sitting:   122 / 78  (right arm)  Pt. in pain?   yes    Location:   back    Intensity:   2-3    Type:       dull  Vitals Entered By: Stanton Kidney Ditzler RN (June 12, 2008 9:53 AM)              Is Patient Diabetic? No Nutritional Status BMI of > 30 = obese Nutritional Status Detail appetite good  Have you ever been in a relationship where you felt threatened, hurt or afraid?denies   Does patient need assistance? Functional Status Self care Ambulation Normal     PCP:  Valetta Close MD   History of Present Illness: Pt is a 55 yo male w/ past medical history of  Coronary artery disease Depression GERD Hypertension Syncope Hypercholesterolemia Cocaine abuse Canabis abuse Low back pain Benign prostatic hypertrophy Tobacco abuse SVT, hx status post ablation   here for f/u of tests results done for syncope.  He has not had any recurrent events.  He had urinary incontinence at that time, no tongue biting or bowel incontinence.  No CP or SOB.  Last  cocaine use 2 days ago.   Depression History:      The patient denies a depressed mood most of the day.           Prior Medications Reviewed Using: Patient Recall  Prior Medication List:  ZOCOR 40 MG TABS (SIMVASTATIN) Take 1 tablet by mouth once daily ASPIRIN 81 MG TBEC (ASPIRIN) Take 1 tablet by mouth once daily HYDROCHLOROTHIAZIDE 25 MG TABS (HYDROCHLOROTHIAZIDE) Take 1 tablet by mouth once daily NAPROXEN SODIUM 220 MG TABS (NAPROXEN SODIUM) Take 1 tablet by mouth two times a day as needed for lower back pain NEXIUM 40 MG CPDR (ESOMEPRAZOLE MAGNESIUM) Take 1 tablet by mouth once a day   Current Allergies (reviewed today): No known allergies   Past Medical History:  Reviewed history from 06/03/2008 and no changes required:       Coronary artery disease       Depression       GERD       Hypertension       Syncope       Hypercholesterolemia       Cocaine abuse       Canabis abuse       Low back pain       Benign prostatic hypertrophy       Tobacco abuse       SVT, hx status post ablation   Social History:    Reviewed history from 01/28/2008 and no changes required:       1 ppd smoker       Drinks 1 - 2 beer a day.        Uses cocaine and marijuana   Risk Factors:  Tobacco use:  current    Cigarettes:  Yes -- 3/4 pack(s) per day   Review of Systems       As per HPI.   Physical Exam  General:     Alert, oriented, no distress.  Neck:     No JVD, no carotid bruit.  Lungs:     CTA bilaterally, normal respiratory effort.  Heart:     RRR, no m/r/g. Abdomen:     Obese, +BS's, soft, NT and ND.  Extremities:     No significant edema.  Neurologic:     alert & oriented X3, cranial nerves II-XII intact, and strength normal in all extremities.   Psych:     Affect somewhat flat.    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  He appears to have carotid stenosis on the R of > 80% and placed referral for vascular surg b/c may need CEA.  Will check MRI/A to further evaluate vasculature and see if pt has an intracranial abnormalities(ie previous strokes, etc).  Will give one time ativan dose b/c of clostrophobia.  Aggressively tx risk factors including BP and lipids.  Will get FLP w/ goal LDL < 70.  His updated medication list for this problem includes:    Aspirin 81 Mg Tbec (Aspirin) .[name]... Take 1 tablet by mouth once daily  Orders: Surgical Referral (Surgery)  Future Orders: T-Lipid Profile ([id number]) ... 06/15/2008 T-Comprehensive Metabolic Panel [id number]) ... 06/15/2008   Problem # 2:  COCAINE ABUSE  (ICD-305.60) Discussed at length cessation of this w/ him.  He reports cutting back to once/twice a week.  Offerred social work services and he declined. Says he is aware of tx programs in the area and is planning on going sometime soon.     Problem # 3:  HYPERTENSION (ICD-401.9) BP well controlled presently.  Will cont current tx regimen. His updated medication list for this problem includes:    Hydrochlorothiazide 25 Mg Tabs (Hydrochlorothiazide) .[name]... Take 1 tablet by mouth once daily   Problem # 4:  Preventive Health Care (ICD-V70.0) Colonoscopy by Dr. Loreta Ave in 10/07 w/ repeat in 5 years.  Normal findings, suboptimal prep.  Complete Medication List: 1)  Zocor 40 Mg Tabs (Simvastatin) .... Take 1 tablet by mouth once daily 2)  Aspirin 81 Mg Tbec (Aspirin) .... Take 1 tablet by mouth once daily 3)  Hydrochlorothiazide 25 Mg Tabs (Hydrochlorothiazide) .... Take 1 tablet by mouth once daily 4)  Naproxen Sodium 220 Mg Tabs (Naproxen sodium) .... Take 1 tablet by mouth two times a day as needed for lower back pain 5)  Nexium 40 Mg Cpdr (Esomeprazole magnesium) .... Take 1 tablet by mouth once a day 6)  Ativan 1 Mg Tabs (Lorazepam) .... Take one tablet by mouth 30 minutes before procedure.  Other Orders: MRI with & without Contrast (MRI w&w/o Contrast)   Patient Instructions: 1)  Please make a followup appointment in one month to go over your test results and appointments. 2)  Please get your blood work and MRI done.  We will call you with anything abnormal. 3)  Great job on taking your medicines.  4)  Please take the ativan 30 minutes before the MRI.  However, remember you can not drive after the MRI. 5)  Please think about quitting smoking.     Prescriptions: ATIVAN 1 MG TABS (LORAZEPAM) Take one tablet by mouth 30 minutes before procedure.  #1 x 0   Entered and Authorized by:   Joaquin Courts  MD   Signed by:   Joaquin Courts  MD on 06/12/2008   Method used:   Print then Give to  Patient  RxID:   [id number]  [email or web address]  Vital Signs:  Patient Profile:   55 Years Old Male Height:     71 inches (180.34 cm) Weight:      284.4 pounds (129.27 kg) BMI:     39.81 Temp:     97.3 degrees F (36.28 degrees C) oral Pulse rate:   75 / minute BP sitting:   122 / 78    Location:   back    Intensity:   2-3    Type:       dull

## 2010-07-07 NOTE — Miscellaneous (Signed)
Summary: [facility]: Physician's Orders  [facility]: Physician's Orders   Imported By: Florinda Marker 09/23/2008 14:12:23  _____________________________________________________________________  External Attachment:    Type:   Image     Comment:   External Document

## 2010-07-07 NOTE — Assessment & Plan Note (Signed)
Summary: syncope/gg   Vital Signs:  Patient Profile:   55 Years Old Male Height:     71 inches (180.34 cm) Weight:      285.1 pounds (129.59 kg) BMI:     39.91 O2 Sat:      100 % O2 treatment:    Room Air Temp:     97.4 degrees F (36.33 degrees C) oral Pulse rate:   83 / minute BP sitting:   126 / 83  (right arm) BP standing:   144 / 86  (right arm)  Pt. in pain?   yes    Location:   both legs    Intensity:   4-5  Vitals Entered By: Stanton Kidney Ditzler RN (June 03, 2008 9:03 AM)              Is Patient Diabetic? No Nutritional Status BMI of > 30 = obese Nutritional Status Detail appetite good  Have you ever been in a relationship where you felt threatened, hurt or afraid?denies   Does patient need assistance? Functional Status Self care Ambulation Normal     Chief Complaint:  05/22/08 - passed out and vomiting x 1 - no problems since.[name]  History of Present Illness: Pt is a 55 year old man with PMH significant for SVT status post ablation, cocaine abuse current. THC abuse, CAD, HTN, HLD, GERD, syncope, and tobacco abuse who presents to clinic for check up after having an episode of syncope approximately two weeks ago.  Pt reports he was sitting in his chair watching television when he started having some blurring of his vision and then passed out.   Pt reports having a headache before and after event.  He denies chest pain, palpitations, fevers, chills, SOB, cough, clonic activity.  Pt reports using THC and cocaine a couple of days prior to appointment. Pt did have urinary incontinence as well but no bowel incontinence or tongue biting.  Pt does have history of syncope in the past.  No further episodes since two weeks ago.      Prior Medications Reviewed Using: Patient Recall  Updated Prior Medication List: ZOCOR 40 MG TABS (SIMVASTATIN) Take 1 tablet by mouth once daily ASPIRIN 81 MG TBEC (ASPIRIN) Take 1 tablet by mouth once daily HYDROCHLOROTHIAZIDE 25 MG TABS  (HYDROCHLOROTHIAZIDE) Take 1 tablet by mouth once daily NAPROXEN SODIUM 220 MG TABS (NAPROXEN SODIUM) Take 1 tablet by mouth two times a day as needed for lower back pain  Current Allergies (reviewed today): No known allergies   Past Medical History:    Coronary artery disease    Depression    GERD    Hypertension    Syncope    Hypercholesterolemia    Cocaine abuse    Canabis abuse    Low back pain    Benign prostatic hypertrophy    Tobacco abuse    SVT, hx status post ablation    Risk Factors:  Tobacco use:  current    Cigarettes:  Yes -- 1/2 pack(s) per day    Counseled to quit/cut down tobacco use:  yes   Review of Systems       See HPI   Physical Exam  General:     alert, well-developed, well-nourished, well-hydrated, and overweight-appearing.  Blood shot eyes.  Head:     normocephalic and atraumatic.   Eyes:     Blood shotpupils equal, pupils round, pupils reactive to light, and pupils react to accomodation.   Neck:     supple,  no JVD, and no carotid bruits.   Lungs:     normal respiratory effort, no intercostal retractions, no accessory muscle use, normal breath sounds, no crackles, and no wheezes.   Heart:     normal rate, regular rhythm, no gallop, no rub, no JVD, and Grade   2/6 systolic ejection murmur.   Abdomen:     soft, non-tender, and normal bowel sounds.   Extremities:     No clubbing, cyanosis, edema, or deformity noted with normal full range of motion of all joints.   Neurologic:     alert & oriented X3, cranial nerves II-XII intact, strength normal in all extremities, sensation intact to light touch, gait normal, and DTRs symmetrical and normal.   Skin:     color normal, no rashes, and no suspicious lesions.   Psych:     Oriented X3, normally interactive, good eye contact, not anxious appearing, not depressed appearing, and not agitated.      Impression & Recommendations:  Problem # 1:  SYNCOPE (ICD-780.2) Pts syncope worrisome for  cardiac etiology given his history.  Cocaine use il likely contributing.  Does not appear to be neurolically mediated at this time.  Pt has not had a repeat syncopal episode in about two weeks.  Will procede with outpatient work up.  Will get a CMET, CBC with diff, TSH, FLP,  UDS, cardiology referral, bilateral carotid dopplers, 2 D echo, EKG, and a CT of head without contrast.  I have advised pt to stop using cocaine and tobacco and to go to the ER immediately if symptoms persist.  Pt to follow up in about a week to go over results.  Will follow up with studies and labs and treat accordingly.   Orders: T-Comprehensive Metabolic Panel [phone]) T-CBC w/Diff [id number]) T-TSH [id number]) T-Drug Screen-Urine, (single) ([id number]) Cardiology Referral (Cardiology) Carotid Doppler (Carotid doppler) 2 D Echo (2 D Echo) CT without Contrast (CT w/o contrast) 12 Lead EKG (12 Lead EKG)   Problem # 2:  TOBACCO ABUSE (ICD-305.1) See above.   Problem # 3:  HYPERCHOLESTEROLEMIA (ICD-272.0) Will continue zocor and check a FLP at follow up appointment. Will also check a CMET.  His updated medication list for this problem includes:    Zocor 40 Mg Tabs (Simvastatin) .[name]... Take 1 tablet by mouth once daily  Orders: T-Comprehensive Metabolic Panel ([id number]) 12 Lead EKG (12 Lead EKG)  Future Orders: T-Lipid Profile ([id number]) ... 06/08/2008   Problem # 4:  HYPERTENSION (ICD-401.9) Fair control.  Will continue HCTZ.  Will check a 2 D echo as well.   His updated medication list for this problem includes:    Hydrochlorothiazide 25 Mg Tabs (Hydrochlorothiazide) .[name]... Take 1 tablet by mouth once daily  Orders: T-Comprehensive Metabolic Panel ([id number]) 2 D Echo (2 D Echo) 12 Lead EKG (12 Lead EKG)   Problem # 5:  LOW BACK PAIN (ICD-724.2) Continue naproxen as needed.  His updated medication list for this problem includes:    Aspirin 81 Mg Tbec (Aspirin) .[name]... Take 1 tablet by  mouth once daily    Naproxen Sodium 220 Mg Tabs (Naproxen sodium) .[name]... Take 1 tablet by mouth two times a day as needed for lower back pain   Problem # 6:  GERD (ICD-530.81) Pt reports the Protonix was not working and he stopped it.  He would like to try a different PPI so will start nexium to see if this helps.  The following medications were removed from the medication list:  Protonix 40 Mg Tbec (Pantoprazole sodium) .[name]... Take 1 tablet by mouth once daily  His updated medication list for this problem includes:    Nexium 40 Mg Cpdr (Esomeprazole magnesium) .[name]... Take 1 tablet by mouth once a day   Complete Medication List: 1)  Zocor 40 Mg Tabs (Simvastatin) .... Take 1 tablet by mouth once daily 2)  Aspirin 81 Mg Tbec (Aspirin) .... Take 1 tablet by mouth once daily 3)  Hydrochlorothiazide 25 Mg Tabs (Hydrochlorothiazide) .... Take 1 tablet by mouth once daily 4)  Naproxen Sodium 220 Mg Tabs (Naproxen sodium) .... Take 1 tablet by mouth two times a day as needed for lower back pain 5)  Nexium 40 Mg Cpdr (Esomeprazole magnesium) .... Take 1 tablet by mouth once a day   Patient Instructions: 1)  Please schedule a follow-up appointment in one week. 2)  Please be fasting at next appointment so we can check your cholesterol.  3)  If you have these symptoms again go to the Emergency room immediately.  4)  Please make sure you get these tests done.  It is important for [place] to find out the exact cause of you passing out. 5)  Please stop using cocaine.   6)  Tobacco is very bad for your health and your loved ones! You Should stop smoking!. 7)  Stop Smoking Tips: Choose a Quit date. Cut down before the Quit date. decide what you will do as a substitute when you feel the urge to smoke(gum,toothpick,exercise). 8)  Limit your Sodium (Salt).   Prescriptions: NEXIUM 40 MG CPDR (ESOMEPRAZOLE MAGNESIUM) Take 1 tablet by mouth once a day  #30 x 2   Entered and Authorized by:   Rufina Falco MD    Signed by:   Rufina Falco MD on 06/03/2008   Method used:   Electronically to        [facility] [id number] (retail)       [address]       [place], [place]  [address]       Ph: [phone]       Fax: [phone]   RxID:   [id number] HYDROCHLOROTHIAZIDE 25 MG TABS (HYDROCHLOROTHIAZIDE) Take 1 tablet by mouth once daily  #31 x 3   Entered and Authorized by:   Rufina Falco MD   Signed by:   Rufina Falco MD on 06/03/2008   Method used:   Electronically to        [facility] [address] (retail)       [address]       [place], [place]  [address]       Ph: [phone]       Fax: [phone]   RxID:   [id number] ASPIRIN 81 MG TBEC (ASPIRIN) Take 1 tablet by mouth once daily  #31 x 3   Entered and Authorized by:   Rufina Falco MD   Signed by:   Rufina Falco MD on 06/03/2008   Method used:   Electronically to        [facility] [address] (retail)       [address]       [place], [place]  [address]       Ph: [phone]       Fax: [phone]   RxID:   [id number]  ZOCOR 40 MG TABS (SIMVASTATIN) Take 1 tablet by mouth once daily  #31 x 3   Entered and Authorized by:   Rufina Falco MD   Signed by:   Rufina Falco MD on 06/03/2008   Method used:   Electronically to        [facility] [address] (retail)       [address]       [place], [place]  [address]       Ph: [phone]       Fax: [phone]   RxID:   [id number] NAPROXEN SODIUM 220 MG TABS (NAPROXEN SODIUM) Take 1 tablet by mouth two times a day as needed for lower back pain  #31 x 3   Entered and Authorized by:   Rufina Falco MD   Signed by:   Rufina Falco MD on 06/03/2008   Method used:   Electronically to        [facility] [address] (retail)       [address]       [place], [place]  [address]       Ph: [phone]       Fax: [phone]   RxID:    [id number]  ]

## 2010-07-07 NOTE — Assessment & Plan Note (Signed)
Summary: BACK PAIN/COHEN/VS   Vital Signs:  Patient profile:   55 year old male Height:      72 inches (182.88 cm) Weight:      292.1 pounds (132.77 kg) BMI:     39.76 Temp:     97.7 degrees F (36.50 degrees C) oral Pulse rate:   89 / minute BP sitting:   135 / 90  (right arm)  Vitals Entered By: Filomena Jungling NT II (November 04, 2008 2:39 PM) CC: BACK PAIN X WEEK  Is Patient Diabetic? No Pain Assessment Patient in pain? yes     Location: back Intensity: 9 Type: sharp Onset of pain  Sudden Nutritional Status BMI of > 30 = obese  Have you ever been in a relationship where you felt threatened, hurt or afraid?No   Does patient need assistance? Functional Status Self care Ambulation Normal   Primary Care Provider:  Valetta Close MD  CC:  BACK PAIN X WEEK .  History of Present Illness: Pt is a 55 year old man with PMH significant for SVT status post ablation, h/o PSAwith cocaine and THC abuse, CAD, HTN, HLD, GERD, syncope, and tobacco abuse who presents to clinic for acute complaint of low back pain. Pain started approx 1-2 weeks ago when he was steping out of shower when he felt something "pop" in his low back.  Denies any acute weakness/numbness or bowel/bladder problems at that time.  Denies any shooting pain down legs but states that he has a "burning" pain on the anterior/lateral aspect of his thighs for years which has gotten worse since the back pain started.  Has had similar episodes of back pain in the past 3 or4 times and at one time had received TENS unit which helped for a while.  He also states he went to PT for a while which helped but then they told him that he did not need to come there anymore so he stopped. Denies any recent fever, chills, CP, SOB, palpitations, N/V, diarrhea. Still smokes and does THC and cocaine occaionally and is not interested in cutting back on either at this time.    Preventive Screening-Counseling & Management  Alcohol-Tobacco     Smoking  Status: current     Smoking Cessation Counseling: yes     Packs/Day: 3/4  Current Medications (verified): 1)  Zocor 80 Mg Tabs (Simvastatin) .... Take 1 Tablet By Mouth Once A Day 2)  Aspirin 81 Mg Tbec (Aspirin) .... Take 1 Tablet By Mouth Once Daily 3)  Hydrochlorothiazide 25 Mg Tabs (Hydrochlorothiazide) .... Take 1 Tablet By Mouth Once Daily 4)  Nexium 40 Mg Cpdr (Esomeprazole Magnesium) .... Take 1 Tablet By Mouth Once A Day 5)  Naproxen 500 Mg Tabs (Naproxen) .... Take One Tab Twice Daily For Two Weeks.  Allergies (verified): No Known Drug Allergies  Past History:  Past Medical History: Last updated: 08/26/2008 Current Problems:  CAROTID ARTERY STENOSIS, RIGHT (ICD-433.10) SUPRAVENTRICULAR TACHYCARDIA, HX OF (ICD-V12.59) PERIPHERAL NEUROPATHY (ICD-356.9) SEXUALLY TRANSMITTED DISEASE, EXPOSURE TO (ICD-V01.6) TOBACCO ABUSE (ICD-305.1) BENIGN PROSTATIC HYPERTROPHY (ICD-600.00) LOW BACK PAIN (ICD-724.2) CANNABIS ABUSE (ICD-305.20) COCAINE ABUSE (ICD-305.60) LOW BACK PAIN, CHRONIC (ICD-724.2) HYPERCHOLESTEROLEMIA (ICD-272.0) BACK PAIN (ICD-724.5) HYPERTENSION (ICD-401.9) SYNCOPE (ICD-780.2) GERD (ICD-530.81) DEPRESSION (ICD-311) CORONARY ARTERY DISEASE (ICD-414.00)  Past Surgical History: Last updated: 08/26/2008 Carotid Endarterectomy- 08/01/2008  Insertion of implantable loop recorder.- 06/18/2002 removal of his implantable loop recorder.05/18/2004  Family History: Last updated: 08/26/2008   Mother has hypertension.  Father died at age 38 of  an MI and had diabetes.  Has a sister who has diabetes and hypertension.  Social History: Last updated: 11/04/2008 1/3 ppd smoker Quit drinking >1 yr ago  Uses  marijuana and cocaine Married   Social History: Reviewed history from 08/26/2008 and no changes required. 1/3 ppd smoker Quit drinking >1 yr ago  Uses  marijuana and cocaine Married   Review of Systems      See HPI  Physical Exam  General:  NAD,  A&Ox3 Lungs:  CTAB Heart:  RRR, no m/r/g Abdomen:  +BS, S/NTND Msk:  Negative straight leg raise test bilaterally.  No CVA tenderness. TTP of the left lumbar paraspinous area. Normal sensation in low back and no skin lesions noted.   Extremities:  No c/c/e Neurologic:  alert & oriented X3, cranial nerves II-XII intact, strength normal in all extremities, sensation intact to light touch, and DTRs symmetrical and normal.  Antalgic gait noted.     Impression & Recommendations:  Problem # 1:  LOW BACK PAIN (ICD-724.2) Oct 2007 MRI of low back showed multi-level DDD with no significant spinal stenosis or cord compression. Likely this is an acute exacerbation  of his low back pain secondary to MSK strain.  With no fevers, focal deficits and negative straight leg test, will hold off on further imaging for now. Will prescribe short two week course of naproxen and refer to PT as this was useful for pt in the past along with TENS unit.  Will encourage pt to use heating pad as needed and return to clinic if he develops any acute focal neurological deficits.    Orders: Physical Therapy Referral (PT)  Problem # 2:  HYPERTENSION (ICD-401.9) BP under good control currently so will continue with current meds for now.  Can consider starting ACEI in the future considering his CAD if persistantly elevated.  Hesitate to use BB with continued use of cocaine.    His updated medication list for this problem includes:    Hydrochlorothiazide 25 Mg Tabs (Hydrochlorothiazide) .[name]... Take 1 tablet by mouth once daily  Problem # 3:  COCAINE ABUSE (ICD-305.60) Pt offered formal counseling for smoking and cocaine abuse cessation but he declined.  Will reasses at next visit.    Complete Medication List: 1)  Zocor 80 Mg Tabs (Simvastatin) .... Take 1 tablet by mouth once a day 2)  Aspirin 81 Mg Tbec (Aspirin) .... Take 1 tablet by mouth once daily 3)  Hydrochlorothiazide 25 Mg Tabs (Hydrochlorothiazide) .... Take 1  tablet by mouth once daily 4)  Nexium 40 Mg Cpdr (Esomeprazole magnesium) .... Take 1 tablet by mouth once a day 5)  Naproxen 500 Mg Tabs (Naproxen) .... Take one tab twice daily for two weeks.  Patient Instructions: 1)  Please schedule a follow-up appointment in 6 months or sooner as needed.   2)  We will set you up with an appointment for physical therapy. 3)  Please remember that it will take at least one week for you to start feeling releif with the narpoxen.   4)  Return to the clinic if your back pain worsens or if you develop sudden weakness or numbness of your legs.  Prescriptions: NAPROXEN 500 MG TABS (NAPROXEN) Take one tab twice daily for two weeks.  #28 x 0   Entered and Authorized by:   Lucy Antigua MD   Signed by:   Lucy Antigua MD on 11/04/2008   Method used:   Print then Give to Patient   RxID:  [id number]  

## 2010-07-07 NOTE — Assessment & Plan Note (Signed)
Summary: 6 month rov/sl   Visit Type:  Follow-up Primary Provider:  Valetta Close MD   History of Present Illness: Mr. Partch returns today for followup.  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  He has longstanding HTN heart disease and diastolic heart failure.  He denies peripheral edema.  He has continued to try and stop smoking.  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  Current Medications (verified): 1)  Zocor 80 Mg Tabs (Simvastatin) .... Take 1 Tablet By Mouth Once A Day 2)  Aspirin 81 Mg Tbec (Aspirin) .... Take 1 Tablet By Mouth Once Daily 3)  Hydrochlorothiazide 25 Mg Tabs (Hydrochlorothiazide) .... Take 1 Tablet By Mouth Once Daily 4)  Nexium 40 Mg Cpdr (Esomeprazole Magnesium) .... Take 1 Tablet By Mouth Once A Day 5)  Naproxen 500 Mg Tabs (Naproxen) .... As Needed  Allergies (verified): No Known Drug Allergies  Past History:  Past Medical History: Last updated: 08/26/2008 Current Problems:  CAROTID ARTERY STENOSIS, RIGHT (ICD-433.10) SUPRAVENTRICULAR TACHYCARDIA, HX OF (ICD-V12.59) PERIPHERAL NEUROPATHY (ICD-356.9) SEXUALLY TRANSMITTED DISEASE, EXPOSURE TO (ICD-V01.6) TOBACCO ABUSE (ICD-305.1) BENIGN PROSTATIC HYPERTROPHY (ICD-600.00) LOW BACK PAIN (ICD-724.2) CANNABIS ABUSE (ICD-305.20) COCAINE ABUSE (ICD-305.60) LOW BACK PAIN, CHRONIC (ICD-724.2) HYPERCHOLESTEROLEMIA (ICD-272.0) BACK PAIN (ICD-724.5) HYPERTENSION (ICD-401.9) SYNCOPE (ICD-780.2) GERD (ICD-530.81) DEPRESSION (ICD-311) CORONARY ARTERY DISEASE (ICD-414.00)  Past Surgical History: Last updated: 08/26/2008 Carotid Endarterectomy- 08/01/2008  Insertion of implantable loop recorder.- 06/18/2002 removal of his implantable loop recorder.05/18/2004  Review of Systems       The patient complains of dyspnea on exertion.  The patient denies chest pain, syncope, and peripheral edema.    Vital Signs:  Patient profile:   55 year old male Height:      72 inches Weight:       288 pounds BMI:     39.20 Pulse rate:   80 / minute BP sitting:   118 / 72  (left arm)  Vitals Entered By: Laurance Flatten CMA (February 24, 2009 12:24 PM)  Physical Exam  General:  Obese middle aged man NAD. Head:  normocephalic and atraumatic Eyes:  PERRLA/EOM intact; conjunctiva and lids normal. Mouth:  Teeth, gums and palate normal. Oral mucosa normal. Neck:  Well healed CEA incision on the right. Lungs:  Clear bilaterally with no wheezes, rales, or rhonchi. Heart:  RRR with normal S1 and S2.  PMI is not enlarged.  NO murmurs. Abdomen:  Bowel sounds positive; abdomen soft and non-tender without masses, organomegaly, or hernias noted. No hepatosplenomegaly. Msk:  Back normal, normal gait. Muscle strength and tone normal. Pulses:  pulses normal in all 4 extremities Extremities:  No clubbing or cyanosis. No edema. Neurologic:  Alert and oriented x 3.   EKG  Procedure date:  02/24/2009  Findings:      Normal sinus rhythm with rate of:  80.  Left ventricular hypertrophy.    Impression & Recommendations:  Problem # 1:  HYPERTENSION (ICD-401.9) His BP is well controlled today.  I have encouraged him to maintain a low sodium diet. His updated medication list for this problem includes:    Aspirin 81 Mg Tbec (Aspirin) .[name]... Take 1 tablet by mouth once daily    Hydrochlorothiazide 25 Mg Tabs (Hydrochlorothiazide) .[name]... Take 1 tablet by mouth once daily  Problem # 2:  SUPRAVENTRICULAR TACHYCARDIA, HX OF (ICD-V12.59) He is asymptomatic s/p ablation. His updated medication list for this problem includes:    Aspirin 81 Mg Tbec (Aspirin) .[name]... Take 1 tablet by mouth once daily  Problem #  3:  CORONARY ARTERY DISEASE (ICD-414.00) He has non-obstructive disease and is without angina.  Continue low dose ASA.  Smoking cessation is recommended. His updated medication list for this problem includes:    Aspirin 81 Mg Tbec (Aspirin) .[name]... Take 1 tablet by mouth once daily  Patient  Instructions: 1)  Your physician recommends that you schedule a follow-up appointment in: 6 months

## 2010-08-11 ENCOUNTER — Encounter: Payer: Self-pay | Admitting: Internal Medicine

## 2010-08-11 ENCOUNTER — Ambulatory Visit (INDEPENDENT_AMBULATORY_CARE_PROVIDER_SITE_OTHER): Payer: Medicare Other | Admitting: Internal Medicine

## 2010-08-11 DIAGNOSIS — N529 Male erectile dysfunction, unspecified: Secondary | ICD-10-CM | POA: Insufficient documentation

## 2010-08-11 DIAGNOSIS — I10 Essential (primary) hypertension: Secondary | ICD-10-CM

## 2010-08-16 NOTE — Assessment & Plan Note (Signed)
Summary: 6 month fu/cy   Visit Type:  Follow-up Primary Provider:  Valetta Close MD   History of Present Illness: Mr. Duval returns today for followup.  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  He has longstanding HTN heart disease and diastolic heart failure.  He denies peripheral edema.  He has continued to try and stop smoking and is down to less than a PPD.   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  His blood pressure has recently been treated more aggressively and is better though he has been out of his meds for several days. No chest pain or sob or peripheral edema.  Current Medications (verified): 1)  Zocor 80 Mg Tabs (Simvastatin) .... Take 1/2  Tablet By Mouth Once A Day 2)  Buffered Aspirin 325 Mg Tabs (Aspirin Buf(Cacarb-Mgcarb-Mgo)) .[name].. 1 Once Daily 3)  Hydrochlorothiazide 25 Mg Tabs (Hydrochlorothiazide) .... Take 1/2  Tablet By Mouth Once Daily 4)  Omeprazole 20 Mg Cpdr (Omeprazole) .... Once Daily 5)  Naproxen 500 Mg Tabs (Naproxen) .... As Needed 6)  Nitrostat 0.4 Mg Subl (Nitroglycerin) .[name].. 1 Tablet Under Tongue At Onset of Chest Pain; You May Repeat Every 5 Minutes For Up To 3 Doses. 7)  Clonidine Hcl 0.1 Mg Tabs (Clonidine Hcl) .... Take One Tablet By Mouth Two Times A Day 8)  Amlodipine Besylate 5 Mg Tabs (Amlodipine Besylate) .[name].. 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.10) SUPRAVENTRICULAR TACHYCARDIA, HX OF (ICD-V12.59) PERIPHERAL NEUROPATHY (ICD-356.9) SEXUALLY TRANSMITTED DISEASE, EXPOSURE TO (ICD-V01.6) TOBACCO ABUSE (ICD-305.1) BENIGN PROSTATIC HYPERTROPHY (ICD-600.00) LOW BACK PAIN (ICD-724.2) CANNABIS ABUSE (ICD-305.20) COCAINE ABUSE (ICD-305.60) LOW BACK PAIN, CHRONIC (ICD-724.2) HYPERCHOLESTEROLEMIA (ICD-272.0) BACK PAIN (ICD-724.5) HYPERTENSION (ICD-401.9) SYNCOPE  (ICD-780.2) GERD (ICD-530.81) DEPRESSION (ICD-311) CORONARY ARTERY DISEASE (ICD-414.00)  Past Surgical History: Last updated: 08/26/2008 Carotid Endarterectomy- 08/01/2008  Insertion of implantable loop recorder.- 06/18/2002 removal of his implantable loop recorder.05/18/2004  Review of Systems  The patient denies chest pain, syncope, dyspnea on exertion, and peripheral edema.    Vital Signs:  Patient profile:   55 year old male Height:      72 inches Weight:      275 pounds BMI:     37.43 Pulse rate:   98 / minute BP sitting:   142 / 78  (left arm)  Vitals Entered By: Laurance Flatten CMA (August 11, 2010 1:54 PM)  Physical Exam  General:  Obese middle aged man NAD. Head:  normocephalic and atraumatic Eyes:  PERRLA/EOM intact; conjunctiva and lids normal. Mouth:  Teeth, gums and palate normal. Oral mucosa normal. Neck:  Well healed CEA incision on the right. Chest Wall:  no deformities or breast masses noted Lungs:  Clear bilaterally with no wheezes, rales, or rhonchi. Heart:  RRR with normal S1 and S2.  PMI is not enlarged.  NO murmurs. Prominent S4 is present. Abdomen:  Bowel sounds positive; abdomen soft and non-tender without masses, organomegaly, or hernias noted. No hepatosplenomegaly. Msk:  Back normal, normal gait. Muscle strength and tone normal. Pulses:  pulses normal in all 4 extremities Extremities:  No clubbing or cyanosis. No edema. Neurologic:  Alert and oriented x 3.   EKG  Procedure date:  08/11/2010  Findings:      Normal sinus rhythm with rate of: 76.  Left ventricular hypertrophy.    Impression & Recommendations:  Problem # 1:  HYPERTENSION (ICD-401.9) His  blood pressure is elevated but he has been off of his meds. I have asked him to take his medications as tolerated. A low sodium diet is recommended. His updated medication list for this problem includes:    Buffered Aspirin 325 Mg Tabs (Aspirin buf(cacarb-mgcarb-mgo)) .[name]... 1 once daily     Hydrochlorothiazide 25 Mg Tabs (Hydrochlorothiazide) .[name]... Take 1/2  tablet by mouth once daily    Clonidine Hcl 0.1 Mg Tabs (Clonidine hcl) .[name]... Take one tablet by mouth two times a day    Amlodipine Besylate 5 Mg Tabs (Amlodipine besylate) .[name]... 1 once daily  Problem # 2:  BACK PAIN (ICD-724.5) His symptoms are well controlled. I have asked him to continue his regular walking.  Problem # 3:  CORONARY ARTERY DISEASE (ICD-414.00) He has non-obstructive disease but denies anginal symptoms. His updated medication list for this problem includes:    Buffered Aspirin 325 Mg Tabs (Aspirin buf(cacarb-mgcarb-mgo)) .[name]... 1 once daily    Nitrostat 0.4 Mg Subl (Nitroglycerin) .[name]... 1 tablet under tongue at onset of chest pain; you may repeat every 5 minutes for up to 3 doses.    Amlodipine Besylate 5 Mg Tabs (Amlodipine besylate) .[name]... 1 once daily  Problem # 4:  ERECTILE DYSFUNCTION, ORGANIC (ICD-607.84) A prescription for viagra is given today.  Other Orders: EKG w/ Interpretation (93000)  Patient Instructions: 1)  Your physician wants you to follow-up in: 12 months with Dr Court Joy will receive a reminder letter in the mail two months in advance. If you don't receive a letter, please call our office to schedule the follow-up appointment. 2)  Your physician recommends that you continue on your current medications as directed. Please refer to the Current Medication list given to you today.

## 2010-08-19 LAB — CBC
HCT: 44.5 % (ref 39.0–52.0)
HCT: 45.1 % (ref 39.0–52.0)
HCT: 46.7 % (ref 39.0–52.0)
Hemoglobin: 15.2 g/dL (ref 13.0–17.0)
Hemoglobin: 15.5 g/dL (ref 13.0–17.0)
Hemoglobin: 15.8 g/dL (ref 13.0–17.0)
MCH: 32.8 pg (ref 26.0–34.0)
MCH: 32.9 pg (ref 26.0–34.0)
MCH: 33 pg (ref 26.0–34.0)
MCHC: 33.9 g/dL (ref 30.0–36.0)
MCHC: 34.1 g/dL (ref 30.0–36.0)
MCHC: 34.3 g/dL (ref 30.0–36.0)
MCV: 96.1 fL (ref 78.0–100.0)
MCV: 96.2 fL (ref 78.0–100.0)
MCV: 97.1 fL (ref 78.0–100.0)
Platelets: 137 10*3/uL — ABNORMAL LOW (ref 150–400)
Platelets: 146 10*3/uL — ABNORMAL LOW (ref 150–400)
Platelets: 158 10*3/uL (ref 150–400)
RBC: 4.63 MIL/uL (ref 4.22–5.81)
RBC: 4.69 MIL/uL (ref 4.22–5.81)
RBC: 4.81 MIL/uL (ref 4.22–5.81)
RDW: 13.7 % (ref 11.5–15.5)
RDW: 13.8 % (ref 11.5–15.5)
RDW: 14 % (ref 11.5–15.5)
WBC: 7.4 10*3/uL (ref 4.0–10.5)
WBC: 7.9 10*3/uL (ref 4.0–10.5)
WBC: 8.2 10*3/uL (ref 4.0–10.5)

## 2010-08-19 LAB — CK ISOENZYMES
CK-MB: 0 % (ref <5)
CK-MM: 100 % (ref 95–100)
Creatine Kinase-Total: 72 U/L (ref 44–196)

## 2010-08-19 LAB — CARDIAC PANEL(CRET KIN+CKTOT+MB+TROPI)
CK, MB: 3.2 ng/mL (ref 0.3–4.0)
CK, MB: 3.6 ng/mL (ref 0.3–4.0)
Relative Index: 3.2 — ABNORMAL HIGH (ref 0.0–2.5)
Relative Index: INVALID (ref 0.0–2.5)
Total CK: 111 U/L (ref 7–232)
Total CK: 93 U/L (ref 7–232)
Troponin I: 0.04 ng/mL (ref 0.00–0.06)
Troponin I: 0.04 ng/mL (ref 0.00–0.06)

## 2010-08-19 LAB — PROTIME-INR
INR: 1.13 (ref 0.00–1.49)
Prothrombin Time: 14.7 seconds (ref 11.6–15.2)

## 2010-08-19 LAB — BASIC METABOLIC PANEL
BUN: 10 mg/dL (ref 6–23)
BUN: 8 mg/dL (ref 6–23)
CO2: 24 mEq/L (ref 19–32)
CO2: 29 mEq/L (ref 19–32)
Calcium: 8.4 mg/dL (ref 8.4–10.5)
Calcium: 8.7 mg/dL (ref 8.4–10.5)
Chloride: 107 mEq/L (ref 96–112)
Chloride: 110 mEq/L (ref 96–112)
Creatinine, Ser: 1.27 mg/dL (ref 0.4–1.5)
Creatinine, Ser: 1.41 mg/dL (ref 0.4–1.5)
GFR calc Af Amer: >60 mL/min (ref >60)
GFR calc Af Amer: >60 mL/min (ref >60)
GFR calc non Af Amer: 52 mL/min — ABNORMAL LOW (ref >60)
GFR calc non Af Amer: 59 mL/min — ABNORMAL LOW (ref >60)
Glucose, Bld: 105 mg/dL — ABNORMAL HIGH (ref 70–99)
Glucose, Bld: 115 mg/dL — ABNORMAL HIGH (ref 70–99)
Potassium: 3.6 mEq/L (ref 3.5–5.1)
Potassium: 4.5 mEq/L (ref 3.5–5.1)
Sodium: 140 mEq/L (ref 135–145)
Sodium: 140 mEq/L (ref 135–145)

## 2010-08-19 LAB — POCT CARDIAC MARKERS
CKMB, poc: 2.3 ng/mL (ref 1.0–8.0)
Myoglobin, poc: 80 ng/mL (ref 12–200)
Troponin i, poc: <0.05 ng/mL (ref 0.00–0.09)

## 2010-08-19 LAB — LIPID PANEL
Cholesterol: 120 mg/dL (ref 0–200)
HDL: 34 mg/dL — ABNORMAL LOW (ref >39)
LDL Cholesterol: 68 mg/dL (ref 0–99)
Total CHOL/HDL Ratio: 3.5 RATIO
Triglycerides: 91 mg/dL (ref <150)
VLDL: 18 mg/dL (ref 0–40)

## 2010-08-19 LAB — DIFFERENTIAL
Basophils Absolute: 0.2 10*3/uL — ABNORMAL HIGH (ref 0.0–0.1)
Basophils Relative: 2 % — ABNORMAL HIGH (ref 0–1)
Eosinophils Absolute: 0.2 10*3/uL (ref 0.0–0.7)
Eosinophils Relative: 3 % (ref 0–5)
Lymphocytes Relative: 50 % — ABNORMAL HIGH (ref 12–46)
Lymphs Abs: 3.9 10*3/uL (ref 0.7–4.0)
Monocytes Absolute: 0.6 10*3/uL (ref 0.1–1.0)
Monocytes Relative: 8 % (ref 3–12)
Neutro Abs: 2.9 10*3/uL (ref 1.7–7.7)
Neutrophils Relative %: 37 % — ABNORMAL LOW (ref 43–77)

## 2010-08-19 LAB — RAPID URINE DRUG SCREEN, HOSP PERFORMED
Amphetamines: NOT DETECTED
Barbiturates: NOT DETECTED
Benzodiazepines: NOT DETECTED
Cocaine: POSITIVE — AB
Opiates: POSITIVE — AB
Tetrahydrocannabinol: POSITIVE — AB

## 2010-08-19 LAB — D-DIMER, QUANTITATIVE (NOT AT ARMC): D-Dimer, Quant: <0.22 ug/mL-FEU (ref 0.00–0.48)

## 2010-08-19 LAB — TSH: TSH: 0.392 u[IU]/mL (ref 0.350–4.500)

## 2010-08-19 LAB — CK TOTAL AND CKMB (NOT AT ARMC)
CK, MB: 2.8 ng/mL (ref 0.3–4.0)
CK, MB: 4 ng/mL (ref 0.3–4.0)
Relative Index: 2.5 (ref 0.0–2.5)
Relative Index: INVALID (ref 0.0–2.5)
Total CK: 157 U/L (ref 7–232)
Total CK: 83 U/L (ref 7–232)

## 2010-08-19 LAB — TROPONIN I
Troponin I: 0.05 ng/mL (ref 0.00–0.06)
Troponin I: 0.05 ng/mL (ref 0.00–0.06)

## 2010-08-19 LAB — HEPARIN LEVEL (UNFRACTIONATED)
Heparin Unfractionated: 0.12 IU/mL — ABNORMAL LOW (ref 0.30–0.70)
Heparin Unfractionated: 0.31 IU/mL (ref 0.30–0.70)

## 2010-08-19 LAB — MAGNESIUM: Magnesium: 2 mg/dL (ref 1.5–2.5)

## 2010-08-19 LAB — APTT: aPTT: 33 seconds (ref 24–37)

## 2010-08-23 LAB — COMPREHENSIVE METABOLIC PANEL
ALT: 17 U/L (ref 0–53)
AST: 21 U/L (ref 0–37)
Albumin: 3.8 g/dL (ref 3.5–5.2)
Alkaline Phosphatase: 81 U/L (ref 39–117)
BUN: 9 mg/dL (ref 6–23)
CO2: 29 mEq/L (ref 19–32)
Calcium: 9.5 mg/dL (ref 8.4–10.5)
Chloride: 104 mEq/L (ref 96–112)
Creatinine, Ser: 1.47 mg/dL (ref 0.4–1.5)
GFR calc Af Amer: >60 mL/min (ref >60)
GFR calc non Af Amer: 50 mL/min — ABNORMAL LOW (ref >60)
Glucose, Bld: 103 mg/dL — ABNORMAL HIGH (ref 70–99)
Potassium: 4.2 mEq/L (ref 3.5–5.1)
Sodium: 141 mEq/L (ref 135–145)
Total Bilirubin: 1.2 mg/dL (ref 0.3–1.2)
Total Protein: 7.8 g/dL (ref 6.0–8.3)

## 2010-08-23 LAB — LIPID PANEL
Cholesterol: 171 mg/dL (ref 0–200)
HDL: 35 mg/dL — ABNORMAL LOW (ref >39)
LDL Cholesterol: 108 mg/dL — ABNORMAL HIGH (ref 0–99)
Total CHOL/HDL Ratio: 4.9 RATIO
Triglycerides: 141 mg/dL (ref <150)
VLDL: 28 mg/dL (ref 0–40)

## 2010-08-23 LAB — CBC
HCT: 51.5 % (ref 39.0–52.0)
Hemoglobin: 17.8 g/dL — ABNORMAL HIGH (ref 13.0–17.0)
MCHC: 34.5 g/dL (ref 30.0–36.0)
MCV: 96.6 fL (ref 78.0–100.0)
Platelets: 156 10*3/uL (ref 150–400)
RBC: 5.34 MIL/uL (ref 4.22–5.81)
RDW: 13.8 % (ref 11.5–15.5)
WBC: 9.1 10*3/uL (ref 4.0–10.5)

## 2010-08-23 LAB — TSH: TSH: 0.407 u[IU]/mL (ref 0.350–4.500)

## 2010-09-07 ENCOUNTER — Other Ambulatory Visit: Payer: Self-pay | Admitting: Internal Medicine

## 2010-09-20 LAB — PROTIME-INR
INR: 1 (ref 0.00–1.49)
INR: 1.1 (ref 0.00–1.49)
Prothrombin Time: 13.9 seconds (ref 11.6–15.2)
Prothrombin Time: 14.5 seconds (ref 11.6–15.2)

## 2010-09-20 LAB — POCT I-STAT, CHEM 8
BUN: 14 mg/dL (ref 6–23)
Calcium, Ion: 1.11 mmol/L — ABNORMAL LOW (ref 1.12–1.32)
Chloride: 102 mEq/L (ref 96–112)
Creatinine, Ser: 1.6 mg/dL — ABNORMAL HIGH (ref 0.4–1.5)
Glucose, Bld: 127 mg/dL — ABNORMAL HIGH (ref 70–99)
HCT: 51 % (ref 39.0–52.0)
Hemoglobin: 17.3 g/dL — ABNORMAL HIGH (ref 13.0–17.0)
Potassium: 3.5 mEq/L (ref 3.5–5.1)
Sodium: 138 mEq/L (ref 135–145)
TCO2: 26 mmol/L (ref 0–100)

## 2010-09-20 LAB — DIFFERENTIAL
Basophils Absolute: 0 10*3/uL (ref 0.0–0.1)
Basophils Relative: 0 % (ref 0–1)
Eosinophils Absolute: 0.1 10*3/uL (ref 0.0–0.7)
Eosinophils Relative: 1 % (ref 0–5)
Lymphocytes Relative: 23 % (ref 12–46)
Lymphs Abs: 2.5 10*3/uL (ref 0.7–4.0)
Monocytes Absolute: 0.9 10*3/uL (ref 0.1–1.0)
Monocytes Relative: 9 % (ref 3–12)
Neutro Abs: 7.2 10*3/uL (ref 1.7–7.7)
Neutrophils Relative %: 67 % (ref 43–77)

## 2010-09-20 LAB — COMPREHENSIVE METABOLIC PANEL
ALT: 26 U/L (ref 0–53)
AST: 25 U/L (ref 0–37)
Albumin: 3.9 g/dL (ref 3.5–5.2)
Alkaline Phosphatase: 81 U/L (ref 39–117)
BUN: 11 mg/dL (ref 6–23)
CO2: 26 mEq/L (ref 19–32)
Calcium: 9.3 mg/dL (ref 8.4–10.5)
Chloride: 103 mEq/L (ref 96–112)
Creatinine, Ser: 1.35 mg/dL (ref 0.4–1.5)
GFR calc Af Amer: >60 mL/min (ref >60)
GFR calc non Af Amer: 56 mL/min — ABNORMAL LOW (ref >60)
Glucose, Bld: 98 mg/dL (ref 70–99)
Potassium: 3.9 mEq/L (ref 3.5–5.1)
Sodium: 138 mEq/L (ref 135–145)
Total Bilirubin: 0.9 mg/dL (ref 0.3–1.2)
Total Protein: 7.4 g/dL (ref 6.0–8.3)

## 2010-09-20 LAB — URINALYSIS, ROUTINE W REFLEX MICROSCOPIC
Bilirubin Urine: NEGATIVE
Bilirubin Urine: NEGATIVE
Glucose, UA: NEGATIVE mg/dL
Glucose, UA: NEGATIVE mg/dL
Ketones, ur: NEGATIVE mg/dL
Ketones, ur: NEGATIVE mg/dL
Nitrite: NEGATIVE
Nitrite: NEGATIVE
Protein, ur: NEGATIVE mg/dL
Protein, ur: NEGATIVE mg/dL
Specific Gravity, Urine: 1.013 (ref 1.005–1.030)
Specific Gravity, Urine: 1.019 (ref 1.005–1.030)
Urobilinogen, UA: 0.2 mg/dL (ref 0.0–1.0)
Urobilinogen, UA: 1 mg/dL (ref 0.0–1.0)
pH: 6 (ref 5.0–8.0)
pH: 6.5 (ref 5.0–8.0)

## 2010-09-20 LAB — CBC
HCT: 43 % (ref 39.0–52.0)
HCT: 47.8 % (ref 39.0–52.0)
HCT: 51.8 % (ref 39.0–52.0)
Hemoglobin: 14.7 g/dL (ref 13.0–17.0)
Hemoglobin: 16.3 g/dL (ref 13.0–17.0)
Hemoglobin: 17.7 g/dL — ABNORMAL HIGH (ref 13.0–17.0)
MCHC: 34 g/dL (ref 30.0–36.0)
MCHC: 34.2 g/dL (ref 30.0–36.0)
MCHC: 34.2 g/dL (ref 30.0–36.0)
MCV: 96.3 fL (ref 78.0–100.0)
MCV: 96.5 fL (ref 78.0–100.0)
MCV: 96.7 fL (ref 78.0–100.0)
Platelets: 144 10*3/uL — ABNORMAL LOW (ref 150–400)
Platelets: 150 10*3/uL (ref 150–400)
Platelets: 181 10*3/uL (ref 150–400)
RBC: 4.45 MIL/uL (ref 4.22–5.81)
RBC: 4.94 MIL/uL (ref 4.22–5.81)
RBC: 5.39 MIL/uL (ref 4.22–5.81)
RDW: 13.4 % (ref 11.5–15.5)
RDW: 13.6 % (ref 11.5–15.5)
RDW: 13.6 % (ref 11.5–15.5)
WBC: 10.8 10*3/uL — ABNORMAL HIGH (ref 4.0–10.5)
WBC: 14.7 10*3/uL — ABNORMAL HIGH (ref 4.0–10.5)
WBC: 9.4 10*3/uL (ref 4.0–10.5)

## 2010-09-20 LAB — URINE MICROSCOPIC-ADD ON

## 2010-09-20 LAB — APTT
aPTT: 35 seconds (ref 24–37)
aPTT: 38 seconds — ABNORMAL HIGH (ref 24–37)

## 2010-09-20 LAB — BASIC METABOLIC PANEL
BUN: 13 mg/dL (ref 6–23)
CO2: 23 mEq/L (ref 19–32)
Calcium: 8.3 mg/dL — ABNORMAL LOW (ref 8.4–10.5)
Chloride: 101 mEq/L (ref 96–112)
Creatinine, Ser: 1.49 mg/dL (ref 0.4–1.5)
GFR calc Af Amer: 60 mL/min — ABNORMAL LOW (ref >60)
GFR calc non Af Amer: 50 mL/min — ABNORMAL LOW (ref >60)
Glucose, Bld: 111 mg/dL — ABNORMAL HIGH (ref 70–99)
Potassium: 3.8 mEq/L (ref 3.5–5.1)
Sodium: 133 mEq/L — ABNORMAL LOW (ref 135–145)

## 2010-09-20 LAB — URINE CULTURE
Colony Count: NO GROWTH
Culture: NO GROWTH

## 2010-09-20 LAB — TYPE AND SCREEN
ABO/RH(D): O POS
Antibody Screen: NEGATIVE

## 2010-09-20 LAB — POCT CARDIAC MARKERS
CKMB, poc: 1.5 ng/mL (ref 1.0–8.0)
Myoglobin, poc: 85.9 ng/mL (ref 12–200)
Troponin i, poc: <0.05 ng/mL (ref 0.00–0.09)

## 2010-09-20 LAB — ABO/RH: ABO/RH(D): O POS

## 2010-09-20 LAB — BILIRUBIN, DIRECT: Bilirubin, Direct: 0.1 mg/dL (ref 0.0–0.3)

## 2010-10-18 ENCOUNTER — Observation Stay (HOSPITAL_COMMUNITY): Payer: Medicare Other

## 2010-10-18 ENCOUNTER — Encounter (HOSPITAL_COMMUNITY): Payer: Self-pay | Admitting: Radiology

## 2010-10-18 ENCOUNTER — Emergency Department (HOSPITAL_COMMUNITY): Payer: Medicare Other

## 2010-10-18 ENCOUNTER — Inpatient Hospital Stay (HOSPITAL_COMMUNITY)
Admission: EM | Admit: 2010-10-18 | Discharge: 2010-10-21 | DRG: 287 | Disposition: A | Discharge status: Home / Self Care | Payer: Medicare Other | Source: Ambulatory Visit | Attending: Internal Medicine | Admitting: Internal Medicine

## 2010-10-18 DIAGNOSIS — M545 Low back pain, unspecified: Secondary | ICD-10-CM | POA: Diagnosis present

## 2010-10-18 DIAGNOSIS — F1410 Cocaine abuse, uncomplicated: Secondary | ICD-10-CM | POA: Diagnosis present

## 2010-10-18 DIAGNOSIS — Z7982 Long term (current) use of aspirin: Secondary | ICD-10-CM

## 2010-10-18 DIAGNOSIS — K219 Gastro-esophageal reflux disease without esophagitis: Secondary | ICD-10-CM | POA: Diagnosis present

## 2010-10-18 DIAGNOSIS — E669 Obesity, unspecified: Secondary | ICD-10-CM | POA: Diagnosis present

## 2010-10-18 DIAGNOSIS — J329 Chronic sinusitis, unspecified: Secondary | ICD-10-CM | POA: Diagnosis present

## 2010-10-18 DIAGNOSIS — I509 Heart failure, unspecified: Secondary | ICD-10-CM | POA: Diagnosis present

## 2010-10-18 DIAGNOSIS — F1010 Alcohol abuse, uncomplicated: Secondary | ICD-10-CM | POA: Diagnosis present

## 2010-10-18 DIAGNOSIS — F17200 Nicotine dependence, unspecified, uncomplicated: Secondary | ICD-10-CM | POA: Diagnosis present

## 2010-10-18 DIAGNOSIS — J019 Acute sinusitis, unspecified: Secondary | ICD-10-CM | POA: Diagnosis present

## 2010-10-18 DIAGNOSIS — G450 Vertebro-basilar artery syndrome: Secondary | ICD-10-CM | POA: Diagnosis present

## 2010-10-18 DIAGNOSIS — I739 Peripheral vascular disease, unspecified: Secondary | ICD-10-CM | POA: Diagnosis present

## 2010-10-18 DIAGNOSIS — Z6841 Body Mass Index (BMI) 40.0 and over, adult: Secondary | ICD-10-CM

## 2010-10-18 DIAGNOSIS — F1210 Cannabis abuse, uncomplicated: Secondary | ICD-10-CM | POA: Diagnosis present

## 2010-10-18 DIAGNOSIS — N400 Enlarged prostate without lower urinary tract symptoms: Secondary | ICD-10-CM | POA: Diagnosis present

## 2010-10-18 DIAGNOSIS — Z7902 Long term (current) use of antithrombotics/antiplatelets: Secondary | ICD-10-CM

## 2010-10-18 DIAGNOSIS — I2510 Atherosclerotic heart disease of native coronary artery without angina pectoris: Principal | ICD-10-CM | POA: Diagnosis present

## 2010-10-18 DIAGNOSIS — I10 Essential (primary) hypertension: Secondary | ICD-10-CM | POA: Diagnosis present

## 2010-10-18 DIAGNOSIS — I5032 Chronic diastolic (congestive) heart failure: Secondary | ICD-10-CM | POA: Diagnosis present

## 2010-10-18 DIAGNOSIS — E785 Hyperlipidemia, unspecified: Secondary | ICD-10-CM | POA: Diagnosis present

## 2010-10-18 DIAGNOSIS — M4802 Spinal stenosis, cervical region: Secondary | ICD-10-CM | POA: Diagnosis present

## 2010-10-18 DIAGNOSIS — I200 Unstable angina: Secondary | ICD-10-CM | POA: Diagnosis present

## 2010-10-18 HISTORY — DX: Essential (primary) hypertension: I10

## 2010-10-18 LAB — POCT I-STAT, CHEM 8
BUN: 14 mg/dL (ref 6–23)
Calcium, Ion: 1.01 mmol/L — ABNORMAL LOW (ref 1.12–1.32)
Chloride: 105 mEq/L (ref 96–112)
Creatinine, Ser: 1.2 mg/dL (ref 0.4–1.5)
Glucose, Bld: 100 mg/dL — ABNORMAL HIGH (ref 70–99)
HCT: 52 % (ref 39.0–52.0)
Hemoglobin: 17.7 g/dL — ABNORMAL HIGH (ref 13.0–17.0)
Potassium: 4.1 mEq/L (ref 3.5–5.1)
Sodium: 138 mEq/L (ref 135–145)
TCO2: 25 mmol/L (ref 0–100)

## 2010-10-18 LAB — RAPID URINE DRUG SCREEN, HOSP PERFORMED
Amphetamines: NOT DETECTED
Barbiturates: NOT DETECTED
Benzodiazepines: NOT DETECTED
Cocaine: NOT DETECTED
Opiates: NOT DETECTED
Tetrahydrocannabinol: NOT DETECTED

## 2010-10-18 LAB — DIFFERENTIAL
Basophils Absolute: 0.1 10*3/uL (ref 0.0–0.1)
Basophils Relative: 1 % (ref 0–1)
Eosinophils Absolute: 0.2 10*3/uL (ref 0.0–0.7)
Eosinophils Relative: 2 % (ref 0–5)
Lymphocytes Relative: 27 % (ref 12–46)
Lymphs Abs: 2.2 10*3/uL (ref 0.7–4.0)
Monocytes Absolute: 0.8 10*3/uL (ref 0.1–1.0)
Monocytes Relative: 10 % (ref 3–12)
Neutro Abs: 4.9 10*3/uL (ref 1.7–7.7)
Neutrophils Relative %: 60 % (ref 43–77)

## 2010-10-18 LAB — POCT CARDIAC MARKERS
CKMB, poc: 1.5 ng/mL (ref 1.0–8.0)
Myoglobin, poc: 67.2 ng/mL (ref 12–200)
Troponin i, poc: <0.05 ng/mL (ref 0.00–0.09)

## 2010-10-18 LAB — CBC
HCT: 47.2 % (ref 39.0–52.0)
Hemoglobin: 16.8 g/dL (ref 13.0–17.0)
MCH: 32.7 pg (ref 26.0–34.0)
MCHC: 35.6 g/dL (ref 30.0–36.0)
MCV: 92 fL (ref 78.0–100.0)
Platelets: 178 10*3/uL (ref 150–400)
RBC: 5.13 MIL/uL (ref 4.22–5.81)
RDW: 12.7 % (ref 11.5–15.5)
WBC: 8.2 10*3/uL (ref 4.0–10.5)

## 2010-10-18 LAB — TSH: TSH: 0.792 u[IU]/mL (ref 0.350–4.500)

## 2010-10-18 LAB — ETHANOL: Alcohol, Ethyl (B): <11 mg/dL — ABNORMAL HIGH (ref 0–10)

## 2010-10-18 NOTE — H&P (Signed)
NAME:  Peter Dunlap, Peter Dunlap NO.:  [id number]  MEDICAL RECORD NO.:  [id number]           PATIENT TYPE:  E  LOCATION:  [facility]                         FACILITY:  [facility]  PHYSICIAN:  Jonny Ruiz, MD    DATE OF BIRTH:  Oct 08, 1955  DATE OF ADMISSION:  10/18/2010 DATE OF DISCHARGE:                             HISTORY & PHYSICAL   PRIMARY CARE PHYSICIAN:  Dr. [name]  CHIEF COMPLAINT:  Dizziness.  HISTORY OF PRESENT ILLNESS:  The patient is a 55 year old male who came to the emergency room with a chief complaint of dizziness, wich occurred while walking about 50 yards earlier this morning.  The patient experienced nausea prior to the dizziness and he felt like he was going to pass out. There was no syncope or fall. For the last 3 days, the patient has been having tingling on the left hand.  Denies focal weakness.  Also he has been having a mild headache and frequent heartburn.  In addition, he complains of chest pain, retrosternal, dull, mild, occuring at rest, and lasting less than 30 minutes.  The condition is aggravated by  walking and by the time he came to the emergency department, the condition has improved.  The patient is currently chest pain free and denies any other symptoms.  PAST MEDICAL HISTORY:  Significant for; 1. Atypical chest pain and near syncope in January 14, 2010, thought to     be due to polysubstance abuse including cocaine and marijuana.  The     patient underwent right 2. Coronary artery endarterectomy in 2010. 3. CAD, non obstructive. Cardiac catheterization in 2003 when he presented with chest pain and     underwent a repeat cardiac catheterization in 2010, prior to     undergoing his right carotid artery endarterectomy, which showed     90% distal LAD obstruction; otherwise, he has a scattered     nonobstructive coronary artery disease 30% to 40%. ECHO 2011 EF 60% to 65% 2. SVT status post ablation, diastolic dysfunction grade II.  His  cardiologist is Dr. [name] with a [facility]. 3. CT of the head, small lacunar infarct.  4. Hypertension. 5. Hyperlipidemia. 6. Obesity. 7. Low back pain for which he is on disability and questionable     peripheral neuropathy. 8. BPH. 9. Tobacco abuse.  CURRENT MEDICATIONS: 1. Aspirin 325 mg a day. 2. Ibuprofen over-the-counter. 3. Naproxen 500 mg b.i.d. p.r.n. for pain. 4. Simvastatin 40 mg a day. 5. Jalyn cap 1 tablet a day. 6. Amlodipine 5 mg a day. 7. Hydrochlorothiazide 12.5 mg a day. 8. Omeprazole 20 mg a day.  ALLERGIES:  No known drug allergies.  FAMILY HISTORY:  Significant for lung cancer.  SOCIAL HISTORY:  The patient lives with his wife, who is at the bedside today.  He smokes half a pack of cigarettes per day.  He admits to cocaine and marijuana 3 weeks ago, none recently.  He drinks alcohol.  REVIEW OF SYSTEMS:  CONSTITUTIONAL:  Denies fever, chills, night sweats, fatigue, malaise, weight gain, or weight loss. ENT:  Denied cardiovascular.  He denies for insomnia, nocturia, or PND. He denies  having chest pain with exertion. RESPIRATORY:  Admits to an occasional cough, no sputum.  Denies history of tuberculosis. GASTROINTESTINAL:  Heartburn without dysphagia or odynophagia as described in the HPI.  Denies vomiting, hematochezia, or melena. GENITOURINARY:  The patient denies nocturia.  Admits to urinary frequency and urgency, which has improved with the addition of Jalyn, which is a new medication for him. MUSCULOSKELETAL:  Suffers from chronic low back pain.  Denies arthralgias or myalgias.  No joint swelling.  PHYSICAL EXAMINATION:  VITAL SIGNS:  Blood pressure 129/66, heart rate 82, respiration 16, temperature 98.2, and pulse oximetry 98% on room air. GENERAL APPEARANCE:  The patient is alert, calm, and comfortably resting in the stretcher.  He is awake, alert, and oriented x3. HEENT:  Unremarkable. NECK:  There is a right surgical scar, which has  healed well.  On auscultation, no carotid bruits.  The patient has no lymphadenopathy. No thyromegaly.  No masses. CHEST:  Symmetric, unable to palpate PMI. HEART:  Regular S1 and S2 without gallops, murmurs, or rubs. LUNGS:  Clear to auscultation. ABDOMEN:  Difficult to examine due to obesity.  Normal bowel sounds.  No bruits.  Soft and nontender.  No organomegaly or masses palpable. RECTAL:  Deferred. EXTREMITIES:  No clubbing, cyanosis, or edema. NEUROLOGIC:  Cranial nerves II through XII were intact.  No meningeal signs.  No focal weakness.  Reflex is normal.  Babinski negative.  No nystagmus.  LABORATORY DATA:  Normal cardiac enzymes.  Alcohol less than 11.  Urine drug screen negative.  Basic metabolic panel normal.  Glucose 100, CBC normal.  Head CT normal.  EKG, normal sinus rhythm.  MEDICAL DECISION-MAKING: 1. Dizziness in the patient with known atherosclerosis (coronary     artery disease with most recent cath 2010, showing 90% distal LAD     and carotid artery endarterectomy in 2010).  The patient will be     admitted to telemetry for further workup, which will include a TSH,     serial cardiac enzymes, repeat carotid ultrasound, Brain MRI and echo.     Continue current medications  2. Atypical chest pain.  We will keep him on omeprazole 20 mg a day.     r/o MI 3. History of low back pain.  Vicodin 5/500 q.4-6 hours p.r.n. for     pain. 4. Hypertension, well controlled.  Continue amlodipine and     hydrochlorothiazide. 5. Hypercholesterolemia.  Discontinue simvastatin in view of the     patient's being on amlodipine.  Start atorvastatin 40 mg a day. 6. Polysubstance abuse including tobacco, alcohol, narcotics, cocaine,     and marijuana.  Consultation with social worker for substance     abuse. 7. Benign prostatic hypertrophy.  tamsulosin 0.4 mg     p.o. at bedtime.          ______________________________ Jonny Ruiz, MD     GL/MEDQ  D:  10/18/2010  T:   10/18/2010  Job:  [id number]  cc:   Dr. Arlester Marker. [name], MD  Electronically Signed by Jonny Ruiz MD on 10/18/2010 07:03:21 PM

## 2010-10-18 NOTE — Discharge Summary (Signed)
Peter Dunlap, Peter Dunlap NO.:  [id number]   MEDICAL RECORD NO.:  [id number]          PATIENT TYPE:  INP   LOCATION:  3305                         FACILITY:  [facility]   PHYSICIAN:  Larina Earthly, M.D.    DATE OF BIRTH:  1956-03-18   DATE OF ADMISSION:  07/30/2008  DATE OF DISCHARGE:  07/31/2008                               DISCHARGE SUMMARY   ADMISSION DIAGNOSIS:  Severe asymptomatic right internal carotid artery  stenosis.   FINAL DISCHARGE DIAGNOSES:  1. Severe asymptomatic right internal carotid artery stenosis status      post right carotid endarterectomy.  2. Postoperative leukocytosis with mildly positive urinalysis with      urine culture still pending.  3. History of tobacco abuse as well as cannabis and cocaine abuse.  4. History of benign prostatic hypertrophy.  5. Hypercholesterolemia.  6. Hypertension.  7. Gastroesophageal reflux disease.  8. Depression.  9. History of supraventricular tachycardia status post ablation.  10.Three-vessel mostly nonobstructive coronary artery disease with      borderline lesion in the mid left anterior descending of 70-80% per      catheterization in February 2010 with normal left ventricular      function and significantly elevated end diastolic pressures      consistent with diastolic dysfunction.  11.History of sexually transmitted disease exposure, Trichomonas.   MUST DO PROCEDURES:  July 30, 2008, right carotid endarterectomy  with Dacron patch angioplasty.   BRIEF HISTORY:  Peter Dunlap is a 55 year old black male who was found to  have greater than 80% carotid artery stenosis by duplex on June 11, 2008, and was referred to Dr. Gretta Began.  He had an episode of blacking  which occurred in December 2009.  Workup included carotid duplex as well  as referral to Dr. Lewayne Bunting to rule out a cardiac etiology.  He did  report an episode 3 years ago when he was walking and fell and had some  left-sided paralysis  and did not seek medical attention, but recovered.  While Dr. Arbie Cookey did not feel that the single episode was related to his  carotid artery disease, we are unsure if the left-sided paralysis that  he experienced 3 years ago was related or not.  Due to his cardiac  history, he was referred to Dr. [name] prior to scheduling right carotid  endarterectomy to reduce the risk for future stroke.  Ultimately, he was  given cardiac clearance.   HOSPITAL COURSE:  Peter Dunlap was electively admitted to [facility] on July 28, 2008.  He was extubated neurologically intact,  and after short stay in recovery unit was transferred to step-down unit  3200 where he remained until discharge.  Overall, he had an uneventful  postoperative course; however, morning labs showed evidence of  leukocytosis with a white count of 14.7.  This was up from his baseline  of 9.4.  Lung sounds were relatively clear, and preoperative chest x-ray  was okay; however, we did note that his preoperative urinalysis did show  a large amount of leukocytes, but no nitrates.  Urine  microscopic also  shows 7-10 WBCs.  Rare bacteria was noted.  Subsequently, we did ask for  followup urinalysis and urine culture.  This was after he had received  some intraoperative and postoperative Zinacef.  His followup urinalysis  only showed trace leukocytes and again was negative for nitrates.  Urine  microscopic showed only 3-6 WBCs.  We opted to discharge him without  antibiotics with plans for his urine culture to be followed up at our  office.  Neurologically, he remained intact.  He denied dysphagia.  His  incision showed no signs of infection or hematoma.  He was able to  ambulate, void, and tolerate food postoperatively and subsequently felt  appropriate for discharge home postoperative day #1 in stable and proven  condition.   DISCHARGE MEDICATIONS:  1. Percocet 5/325 mg 1-2 tablets p.o. q.4 hours p.r.n. pain.  2. Aspirin 81  mg p.o. daily  3. Nexium 40 mg p.o. daily.  4. Simvastatin 80 mg p.o. daily.  5. Hydrochlorothiazide 25 mg p.o. daily.   DISCHARGE INSTRUCTIONS:  Continue a heart-healthy diet, may shower and  clean incisions gently with soap and water.  Should avoid driving or  heavy lifting for the next 2 weeks.  Will see Dr. Arbie Cookey in 2-3 weeks.  Our office will contact him regarding specific appointment date and  time.  He should call sooner if he has fever greater than 101, redness  or drainage from the incision site, severe headache, or other neurologic  changes.  I have spoken with Peter Button, RN, at the [facility] office who will  follow up with his urine cultures within the next 48-36 hours and will  contact him with further instructions if results are positive.      Jerold Coombe, P.A.      Larina Earthly, M.D.  Electronically Signed    AWZ/MEDQ  D:  07/31/2008  T:  08/01/2008  Job:  [id number]   cc:   [name]. [name], MD

## 2010-10-18 NOTE — Assessment & Plan Note (Signed)
OFFICE VISIT   Peter, Dunlap  DOB:  [birth year]                                       08/14/2008  ZOXWR#:[id number]   Patient presents today for followup of his right carotid endarterectomy  and Dacron patch angioplasty on 07/30/08.  He did well initially and was  discharged home on postoperative day #1.  He did return to the emergency  room on the evening after his discharge concerning some swelling in his  neck.  He was admitted overnight and had no ongoing complications and  was discharged.  He has done well since this discharge.  He has had no  neurologic deficits.  He has the usual amount of peri-incisional  numbness, otherwise is doing well from a carotid standpoint.   PHYSICAL EXAMINATION:  He has 2+ radial pulses bilaterally.  He is  grossly intact neurologically.  His right neck incision is well healed.   He is pleased with his initial response, as am I.  I plan to see him  again in 6 months with repeat carotid duplex. He knows to notify us  immediately should he develop any wound issues or neurologic deficits.   Larina Earthly, M.D.  Electronically Signed   TFE/MEDQ  D:  08/14/2008  T:  08/17/2008  Job:  [id number]   cc:   Peter Lav, MD  [name]. [name], MD  Bevelyn Buckles. Bensimhon, MD

## 2010-10-18 NOTE — Procedures (Signed)
CAROTID DUPLEX EXAM   INDICATION:  Right carotid endarterectomy.   HISTORY:  Diabetes:  No.  Cardiac:  No.  Hypertension:  No.  Smoking:  Yes.  Previous Surgery:  Right carotid endarterectomy on 07/30/2008.  CV History:  Asymptomatic.  Amaurosis Fugax No, Paresthesias No, Hemiparesis No                                       RIGHT             LEFT  Brachial systolic pressure:         150               138  Brachial Doppler waveforms:         Normal            Normal  Vertebral direction of flow:        Antegrade         Antegrade  DUPLEX VELOCITIES (cm/sec)  CCA peak systolic                   107               143  ECA peak systolic                   101               100  ICA peak systolic                   42                79  ICA end diastolic                   13                26  PLAQUE MORPHOLOGY:                                    Heterogeneous  PLAQUE AMOUNT:                      None              Mild  PLAQUE LOCATION:                                      ICA   IMPRESSION:  1. Patent right carotid endarterectomy site with no evidence of right      internal carotid artery stenosis.  2. 1-39% stenosis of the left internal carotid artery.        ___________________________________________  Larina Earthly, M.D.   CH/MEDQ  D:  02/12/2009  T:  02/13/2009  Job:  [id number]

## 2010-10-18 NOTE — Consult Note (Signed)
NEW PATIENT CONSULTATION   Peter Dunlap, Peter Dunlap  DOB:  04/28/1956                                       06/17/2008  ZOXWR#:[id number]   The patient presents today regarding discussion a recent discovery of a  severe greater than 80% carotid stenosis by carotid duplex on  06/11/2008.  The patient is a 55 year old gentleman who had an episode  of blacking out.  This occurred in late December.  He has an ongoing  evaluation to include a carotid duplex and also upcoming evaluation by  Dr. Lewayne Bunting to rule out cardiac etiology of this.  He has also been  scheduled to have an MRI/MRA on 06/22/2008.  I do have his carotid  duplex evaluation for discussion.  The patient reports that with this  most recent event that he does not have any focal deficits.  On further  questioning, he does report an episode approximately 3 years ago when he  was walking and had left-sided paralysis.  He reports that he fell and  then had complete recovery from this.  He did not seek medical  attention, but apparently was mostly flaccid on his left arm and leg  during this period.  Otherwise, on past history, he does report having  prior myocardial infarction approximately 2 years ago.  He has multiple  medical issues to include supraventricular tachycardia, sexually  transmitted disease exposure, BPH, hypercholesterolemia, hypertension,  gastroesophageal reflux disease, and depression.   SOCIAL HISTORY:  Positive for tobacco abuse.  Also ongoing cannabis and  cocaine abuse.   MEDICATIONS:  1. Zocor 40 mg daily.  2. Aspirin 81 mg daily.  3. Hydrochlorothiazide 25 mg daily.  4. Naprosyn daily as needed for back pain.  5. Nexium 40 mg.   ALLERGIES:  No known drug allergies.   REVIEW OF SYSTEMS:  RESPIRATORY:  Positive for shortness of breath when  lying flat.  GASTROINTESTINAL:  Esophageal reflux.  Diarrhea.  GENITOURINARY:  Urinary frequency.  NEUROLOGIC:  Black out spells  necessitating this evaluation.  MUSCULOSKELETAL:  Arthritis.  PSYCH:  Nervousness.   PHYSICAL EXAMINATION:  A well-developed, moderately obese black male  appearing stated age of 66.  His blood pressure is 147/96, pulse 82,  respirations 18.  His carotid arteries are without bruits bilaterally.  His radial pulses are 2+.  He has 2+ femoral, 2+ popliteal, and 2+  dorsalis pedis pulses bilaterally.  Abdomen shows no evidence of  aneurysm or masses.  Chest is clear bilaterally.  Heart is regular rate  and rhythm.   I reviewed his duplex with him.  This does show severe, greater than 80%  stenosis with end diastolic velocity on the right at 116 cm per second.  I discussed this with the patient and his wife present.  I explained  that I do not feel that, in all likelihood, this is not related to the  most recent event that he had with the syncopal spell.  I explained that  this may have had something to do with the left-sided paralysis he had 3  years ago.  I agree with his cardiac workup as planned.  He is to see  Dr. Lewayne Bunting on January 27.  Assuming that he has no ongoing cardiac  issues, I would recommend right carotid endarterectomy for reduction of  stroke risk due to his critical stenosis.  He understands and we will  schedule this once he has had cardiac clearance.  I discussed the  procedure, including 1% to 2% risk of stroke with surgery.  I explained  he would be admitted the day of the procedure and in all likelihood be  discharged on postoperative day 1.   Larina Earthly, M.D.  Electronically Signed   TFE/MEDQ  D:  06/17/2008  T:  06/18/2008  Job:  [id number]   cc:   Acey Lav, MD  [name]. [name], MD

## 2010-10-18 NOTE — Assessment & Plan Note (Signed)
OFFICE VISIT   KEYLAN, COSTABILE  DOB:  [birth year]                                       02/12/2009  UEAVW#:[id number]   The patient presents today for followup of his right carotid  endarterectomy on 07/30/2008 for severe carotid disease.  He denies any  neurologic deficits since his surgery.  He does have some mild peri-  incisional numbness.  He denies any new cardiac difficulty and has  remained stable.  Unfortunately, he does continue to smoke.   PHYSICAL EXAMINATION:  His blood pressure today is 150/95, pulse 81,  respirations 18.  His carotid arteries are without bruits bilaterally.  He has a well-healed right carotid incision.  He is grossly intact  neurologically.  He underwent a repeat carotid duplex, and this shows  widely-patent arterectomy on the right with no evidence of stenosis.  His left carotid also has no significant stenosis.  I am quite pleased  with his recovery, as is the patient.  We plan to see him on a yearly  basis in our vascular lab to rule out restenosis.   Larina Earthly, M.D.  Electronically Signed   TFE/MEDQ  D:  02/12/2009  T:  02/15/2009  Job:  [id number]   cc:   Dr. Bruna Potter

## 2010-10-18 NOTE — Assessment & Plan Note (Signed)
OFFICE VISIT   Peter Dunlap, Peter Dunlap  DOB:  11-02-1955                                       07/17/2008  ZOXWR#:[id number]   The patient presented today for continued discussion regarding his  critical stenosis in his left internal carotid artery.  I have seen him  for initial consultation on June 17, 2008.  At that time he is  undergoing further cardiac workup and I had recommended right carotid  endarterectomy following his cardiac evaluation.  He has also  subsequently undergone MRI and MRA of his brain since that time, showing  a no critical issues.  He did undergo a cardiac stress test which was  abnormal and therefore underwent cardiac cath on July 13, 2008.  This  showed no critical lesions with normal LV function.  I have discussed  this with Dr. Gala Romney, he has recommend that we proceed with  endarterectomy and then further cardiac follow-up..  I discussed this at  length today with the patient and his wife present, they wish to proceed  with surgery.  I did again explain the procedure, to include admission  day surgery and potential risks including stroke and cranial nerve  injury.  They understand and wish to proceed with surgery and we have  scheduled this, at the patient's convenience, on 07/28/2008 at [facility].   Larina Earthly, M.D.  Electronically Signed   TFE/MEDQ  D:  07/17/2008  T:  07/20/2008  Job:  [id number]   cc:   Acey Lav, MD  [name]. [name], MD  Bevelyn Buckles. Bensimhon, MD

## 2010-10-18 NOTE — H&P (Signed)
NAME:  Peter Dunlap, Peter Dunlap NO.:  [id number]   MEDICAL RECORD NO.:  [id number]          PATIENT TYPE:  INP   LOCATION:  1857                         FACILITY:  [facility]   PHYSICIAN:  Quita Skye. Hart Rochester, M.D.  DATE OF BIRTH:  03/09/1956   DATE OF ADMISSION:  08/01/2008  DATE OF DISCHARGE:                              HISTORY & PHYSICAL   CHIEF COMPLAINT:  Swelling right neck, post carotid endarterectomy.   HISTORY OF PRESENT ILLNESS:  This is a 55 year old African American male  who had right carotid endarterectomy performed by Dr. Arbie Cookey on July 30, 2008, (48 hours ago).  He was discharged on the first postoperative  day without difficulties.  Neurologically intact.  Swallowing well with  no dysphagia.  He did well until 2 a.m., today when he noted that he had  a sensation of fluid in his throat and was not breathing as easily as  he had been previously.  He has had no difficulty swallowing although he  noted some occasional pain on swallowing.  Neurologically, he was normal  and had no episodes of weakness in the left side, aphasia, or other  specific neurologic symptoms.  He came to the emergency department,  where CT scan was ordered by the emergency physician, which revealed  some postoperative swelling in the right neck and slight deviation of  hypopharynx to the left side with no large hematoma.  There was some  swelling in the area of the sternocleidomastoid muscle on the right.   PHYSICAL EXAMINATION:  VITALS:  Blood pressure is 150/70, heart rate is  70, temperature 99.4, respirations 14.  GENERAL:  He is alert, African American male patient in no apparent  distress.  Breathing comfortably.  He is alert and oriented x3.  NEUROLOGIC:  Normal.  Right neck has some postoperative swelling, which  is in within normal limits.  Incision is healing adequately.  CHEST:  Clear to auscultation.  There is no stridor.  ABDOMEN:  Soft, nontender, with no masses.   IMPRESSION:  Postoperative swelling with slight respiratory  symptomatology, although no distress and no stridor.   PLAN:  To admit the patient for observation for 24 hours.  I did not  feel that he will need reexploration or evacuation of this hematoma and  states that he is breathing much better now that is in the emergency  department, but we will observe for 24 hours.      Quita Skye Hart Rochester, M.D.  Electronically Signed     JDL/MEDQ  D:  08/01/2008  T:  08/01/2008  Job:  [id number]

## 2010-10-18 NOTE — Op Note (Signed)
NAME:  Peter Dunlap, Peter Dunlap NO.:  [id number]   MEDICAL RECORD NO.:  [id number]          PATIENT TYPE:  INP   LOCATION:  3305                         FACILITY:  [facility]   PHYSICIAN:  Larina Earthly, M.D.    DATE OF BIRTH:  23-Nov-1955   DATE OF PROCEDURE:  07/30/2008  DATE OF DISCHARGE:                               OPERATIVE REPORT   PREOPERATIVE DIAGNOSIS:  Severe asymptomatic right internal carotid  artery stenosis.   POSTOPERATIVE DIAGNOSIS:  Severe asymptomatic right internal carotid  artery stenosis.   PROCEDURE:  Right carotid endarterectomy and Dacron patch angioplasty.   SURGEON:  Larina Earthly, MD.   ASSISTANT:  Jerold Coombe, PA-C.   ANESTHESIA:  General endotracheal.   COMPLICATIONS:  None.   DISPOSITION:  To recovery room, stable.   PROCEDURE IN DETAIL:  The patient was taken to the operating room,  placed in the supine position.  The area of the right neck was prepped  and draped in usual sterile fashion.  Incision was made in the anterior  sternocleidomastoid and carried down through the platysma with  electrocautery.  The sternocleidomastoid was reflected posteriorly and  the carotid sheath was opened.  The facial vein was ligated with 2-0  silk ties and divided.  The common carotid artery was encircled with an  umbilical tape and Rumel tourniquet.  The vagus nerve was identified and  preserved.  Dissection was taken on to the bifurcation.  The patient had  multiple external branches.  These were controlled with 2-0 silk Potts  tie.  The main external carotid artery was encircled with a blue vessel  loop.  The internal carotid artery was encircled with umbilical tape and  Rumel tourniquet.  The hypoglossal nerve was identified and preserved.  Dissection was taken on to the bifurcation.  The patient was given 9000  units of intravenous heparin.  After adequate circulation time, the  internal, external, and common carotid arteries were occluded.   The  common carotid arteries were opened with an 11 blade and extended  longitudinally with Potts scissors to the plaque onto the internal  carotid with Potts scissors.  A 10 shunt was passed up internal carotid  to allow the back bleed and down the common carotid where it was secured  with Rumel tourniquets.  The endarterectomy was begun on the common  carotid artery and plaque was divided proximally with Potts scissors.  The endarterectomy was done on to the bifurcation.  The external carotid  was endarterectomized with an eversion technique and the internal  carotid was endarterectomized in an open fashion.  Remaining  atheromatous debris was removed from endarterectomy plane.  A Finesse  Hemashield Dacron patch was brought onto the field and was sewn as a  patch angioplasty with a running 6-0 Prolene suture.  Prior to  completion of the anastomosis, the shunt was removed and the usual  flushing maneuvers were undertaken.  The anastomosis was complete and  the flow was restored first to the external and internal carotid artery.  Excellent flow characteristics were noted with hand-held  Doppler in the  internal and external carotid arteries.  The patient was  given 50 mg of protamine to reverse the heparin.  The wounds were  irrigated with saline.  Hemostasis with electrocautery.  Wounds were  closed with 3-0 Vicryl in the subcutaneous and subcuticular tissue.  Benzoin and Steri-Strips were applied.      Larina Earthly, M.D.  Electronically Signed     TFE/MEDQ  D:  07/30/2008  T:  07/31/2008  Job:  [id number]

## 2010-10-18 NOTE — Assessment & Plan Note (Signed)
[facility]                         ELECTROPHYSIOLOGY OFFICE NOTE   Peter Dunlap, Peter Dunlap                    MRN:          [id number]  DATE:07/09/2008                            DOB:          February 09, 1956    [name] returns today for followup.  He is a very pleasant middle-  aged man with a history of SVT who underwent ablation.  He also has a  history of nonobstructive coronary disease by catheterization in 2003.  He came to me a couple of weeks ago for preoperative evaluation as he  has got carotid artery stenosis and has been scheduled to undergo  carotid endarterectomy by Dr. Arbie Cookey.  The patient at the time of his  visit noted that he was having exertional anginal symptoms and had  multiple cardiac risk factors including hypertension, dyslipidemia,  obesity, and known other vascular disease and we had him undergo  exercise treadmill testing where the patient walked for 6 minutes  stopping secondary to fatigue and chest pain.  There were no diagnostic  ST-T wave changes.  His LV function, however, was down calculated at 36%  and lateral wall hypokinesis was observed.  In addition, there was  evidence of mild lateral wall ischemia on perfusion imaging.  With these  findings, he is here today for followup.  He notes that he still gets  short of breath and has chest discomfort with strenuous activity and  even not so strenuous activity.  There were no rest resting symptoms  present.   On physical examination, he is a pleasant well-appearing middle-aged man  in no acute distress.  Blood pressure today was 122/70, the pulse was 88  and regular, respirations were 18, the weight was 284 pounds.  Neck  revealed no jugular venous distention.  There was no thyromegaly.  The  lungs were clear bilaterally to auscultation.  No wheezes, rales, or  rhonchi are present.  There was no increased work of breathing.  The  cardiovascular exam revealed a regular rate  and rhythm.  Normal S1 and  S2.  There was soft S4 gallop present.  The PMI was not particularly  enlarged, but it was laterally displaced.  The abdominal exam was soft  and nontender.  The extremities demonstrate no edema.   His medications include:  1. Zocor 80 a day.  2. Nexium 40 a day.  3. Hydrochlorothiazide 25 a day.  4. Aspirin 81 a day.   IMPRESSION:  1. Chronic stable angina in a patient with new left ventricular      dysfunction and lateral ischemia on stress Myoview study.  2. Known supraventricular tachycardia status post ablation.  3. Cerebral/carotid vascular disease for a possible carotid      endarterectomy.   DISCUSSION:  With regard of all of [name] symptoms, I have  recommended that he undergo left heart catheterization as his risk for 3-  vessel disease or perhaps tight LAD or circumflex disease based on his  stress test is fairly significant.  The risks, benefits, goals, and  expectations of catheterization have discussed with the patient and I  will schedule  this at the earliest possible convenient time.     Peter Dunlap. [name], MD  Electronically Signed    GWT/MedQ  DD: 07/09/2008  DT: 07/10/2008  Job #: [id number]   cc:   Larina Earthly, M.D.

## 2010-10-18 NOTE — Assessment & Plan Note (Signed)
[facility]                         ELECTROPHYSIOLOGY OFFICE NOTE   Peter Dunlap, Peter Dunlap                    MRN:          [id number]  DATE:07/01/2008                            DOB:          05-11-1956    Peter Dunlap returns today for followup.  He is a 55 year old male with a  history of carotid vascular disease who has been scheduled to undergo  carotid endarterectomy.  The patient is here today for preoperative  evaluation.  The patient has a history of SCT and syncope and status  post catheter ablation for this several years ago.  At that time, he was  found to have dual AV nodal physiology, underwent successful slow  pathway modification, and since then has had no recurrent SVT and no  recurrent syncope.  The patient does note with exertion that he has  substernal chest discomfort, which resolves with rest, sometimes he has  chest discomfort at other times.  He has multiple cardiac risk factors.   CURRENT MEDICATIONS:  1. Zocor 80 a day.  2. Nexium 40 a day.  3. HCTZ 25 a day.  4. Aspirin 81 mg daily.   PAST MEDICAL HISTORY:  Notable for hypertension.  He has a history of  dyslipidemia.  He has a catheterization dating back to 2003 where he had  nonobstructive coronary artery disease with up to 50% stenosis and LAD  and a septal perforator branch.  He was treated at that time with  medical therapy.  He unfortunately continues to smoke cigarettes.   PHYSICAL EXAMINATION:  GENERAL:  He is a pleasant middle-aged man in no  distress.  VITAL SIGNS:  Blood pressure was 130/70, the pulse 70 and regular, the  respirations were 18, the weight was 286 pounds.  NECK:  No jugular venous distention.  LUNGS:  Clear bilaterally to auscultation.  No wheezes, rales, or  rhonchi are present.  CARDIOVASCULAR:  Regular rate and rhythm.  Normal S1 and S2.  There is  soft S4 gallop present.  ABDOMEN:  Soft, nontender.  EXTREMITIES:  No cyanosis, clubbing, or  edema.  Pulses are 2+ and  symmetric.   EKG demonstrates sinus rhythm with left ventricular hypertrophy and very  markedly abnormal ST-T segments consistent with strain.   IMPRESSION:  1. Chest pain with typical and atypical characteristics.  2. Carotid vascular for upcoming carotid endarterectomy.  3. History of syncope secondary to SVT status post ablation.  4. Dyslipidemia.  5. Ongoing tobacco abuse.  6. Hypertension.   DISCUSSION:  Mr. Matsuoka is scheduled for carotid endarterectomy.  My  plan is to have him proceed with stress Myoview study to rule out major  cardiovascular ischemia prior to his carotid endarterectomy.  If his  Myoview is markedly abnormal, then catheterization prior to  endarterectomy would be required.     [name]. [name], MD  Electronically Signed    GWT/MedQ  DD: 07/01/2008  DT: 07/02/2008  Job #: [id number]

## 2010-10-18 NOTE — Cardiovascular Report (Signed)
NAME:  TARVARES, LANT NO.:  [id number]   MEDICAL RECORD NO.:  [id number]          PATIENT TYPE:  OIB   LOCATION:  NA                           FACILITY:  [facility]   PHYSICIAN:  Bevelyn Buckles. Bensimhon, MDDATE OF BIRTH:  09/05/1955   DATE OF PROCEDURE:  07/13/2008  DATE OF DISCHARGE:                            CARDIAC CATHETERIZATION   HISTORY OF PRESENT ILLNESS:  Mr.  Botero is a very pleasant 55 year old  male with history of hypertension, hyperlipidemia and known coronary  artery disease.  He underwent catheterization in 2003 by Dr. Fraser Din.  This showed approximately a 60% lesion in the mid-LAD.  He was treated  medically.  Unfortunately, he has continued to smoke.  He was recently  found to have carotid stenosis and is being preoperatively evaluated for  that.  He underwent Myoview which showed lateral wall ischemia and  ejection fraction of 36%.  However, did also have echocardiogram which  showed an EF of 65-75% with no regional wall motion abnormalities.   He is thus sent for cardiac catheterization.   PROCEDURES PERFORMED:  1. Selective coronary angiography.  2. Left heart cath.  3. Left ventriculogram.   DESCRIPTION OF PROCEDURE:  The risks and indication of the  catheterization were explained.  Consent was signed and placed on the  chart.  A 4-French arterial sheath was placed in the right femoral  artery using a modified Seldinger technique.  Standard catheters  including JL-4, 3-DRC and angled pigtail were used.  All catheter  exchanges made over wire.  There no apparent complications.   Central aortic pressure was 130/80 with a mean of 103.  LV pressure was  147/14 with an EDP of 25-28.  There is no aortic stenosis.   Left main had a 30% proximal to mid stenosis.   LAD was a long vessel at the distal LAD, essentially bifurcated into the  diagonal and a smaller LAD.  There were two small proximal diagonals, a  moderate sized mid diagonal and a large  fourth diagonal.  There were  also several small septal perforators.  Proximal LAD:  There is a  tubular 30-40% lesion in the mid-LAD just after the takeoff of third  diagonal.  There was a 70-80% stenosis.  The distal septal perforator  was small, diffusely diseased.   Left circumflex gave off a large OM-1 and a moderate sized  posterolateral.  In the proximal AV groove circ, there is a 30%  stenosis.  In the mid AV groove circ, there is about a 50% stenosis just  after the takeoff of the OM-1.  There was some mild diffuse disease in  the OM-1 in the distal AV groove circ.  Leading into the small to  moderate-sized posterolateral there was a 50-60% focal lesion.   RCA was a large dominant vessel.  It had diffuse atherosclerotic  plaquing.  There was about 30-40% stenosis throughout the entire vessel.  In the distal vessel between the first and second posterolaterals there  was a proximal 50% lesion.  There were no high-grade stenoses.   Left ventriculogram done in the RAO and  LAO projection showed an EF of  65% with no regional wall motion abnormalities in the lateral wall as  well.   ASSESSMENT:  1. Three-vessel mostly nonobstructive coronary artery disease with a      borderline lesion in the mid LAD of 70-80% as described above.  2. Normal LV function with significantly elevated end-diastolic      pressures consistent with diastolic dysfunction.   PLAN/DISCUSSION:  I think this LAD lesion is borderline, does not appear  critical or significantly flow-limiting.  At this point, I will proceed  with his carotid endarterectomy.  I will review the films with Dr.  Juanda Chance to see if he might benefit from elective PCI after surgery when  it would be safe to initiate Plavix.  Continue risk factor management.  I have emphasized the need to him to stop smoking.      Bevelyn Buckles. Bensimhon, MD  Electronically Signed     DRB/MEDQ  D:  07/13/2008  T:  07/13/2008  Job:  [id number]

## 2010-10-19 ENCOUNTER — Observation Stay (HOSPITAL_COMMUNITY): Payer: Medicare Other

## 2010-10-19 DIAGNOSIS — R209 Unspecified disturbances of skin sensation: Secondary | ICD-10-CM

## 2010-10-19 DIAGNOSIS — I369 Nonrheumatic tricuspid valve disorder, unspecified: Secondary | ICD-10-CM

## 2010-10-19 DIAGNOSIS — R079 Chest pain, unspecified: Secondary | ICD-10-CM

## 2010-10-19 DIAGNOSIS — R55 Syncope and collapse: Secondary | ICD-10-CM

## 2010-10-19 LAB — CARDIAC PANEL(CRET KIN+CKTOT+MB+TROPI)
CK, MB: 2.9 ng/mL (ref 0.3–4.0)
CK, MB: 3.8 ng/mL (ref 0.3–4.0)
Relative Index: INVALID (ref 0.0–2.5)
Relative Index: INVALID (ref 0.0–2.5)
Total CK: 75 U/L (ref 7–232)
Total CK: 74 U/L (ref 7–232)
Troponin I: <0.3 ng/mL (ref <0.30)
Troponin I: <0.3 ng/mL (ref <0.30)

## 2010-10-19 LAB — HEMOGLOBIN A1C
Hgb A1c MFr Bld: 5.3 % (ref <5.7)
Mean Plasma Glucose: 105 mg/dL (ref <117)

## 2010-10-20 DIAGNOSIS — I2510 Atherosclerotic heart disease of native coronary artery without angina pectoris: Secondary | ICD-10-CM

## 2010-10-20 LAB — BASIC METABOLIC PANEL
BUN: 14 mg/dL (ref 6–23)
CO2: 27 mEq/L (ref 19–32)
Calcium: 8.6 mg/dL (ref 8.4–10.5)
Chloride: 103 mEq/L (ref 96–112)
Creatinine, Ser: 1.03 mg/dL (ref 0.4–1.5)
GFR calc Af Amer: >60 mL/min (ref >60)
GFR calc non Af Amer: >60 mL/min (ref >60)
Glucose, Bld: 94 mg/dL (ref 70–99)
Potassium: 3.5 mEq/L (ref 3.5–5.1)
Sodium: 136 mEq/L (ref 135–145)

## 2010-10-20 LAB — PROTIME-INR
INR: 1.09 (ref 0.00–1.49)
Prothrombin Time: 14.3 seconds (ref 11.6–15.2)

## 2010-10-20 LAB — APTT: aPTT: 37 seconds (ref 24–37)

## 2010-10-21 LAB — BASIC METABOLIC PANEL
BUN: 11 mg/dL (ref 6–23)
CO2: 29 mEq/L (ref 19–32)
Calcium: 9.1 mg/dL (ref 8.4–10.5)
Chloride: 103 mEq/L (ref 96–112)
Creatinine, Ser: 1.12 mg/dL (ref 0.4–1.5)
GFR calc Af Amer: >60 mL/min (ref >60)
GFR calc non Af Amer: >60 mL/min (ref >60)
Glucose, Bld: 93 mg/dL (ref 70–99)
Potassium: 3.9 mEq/L (ref 3.5–5.1)
Sodium: 139 mEq/L (ref 135–145)

## 2010-10-21 LAB — CBC
HCT: 44.8 % (ref 39.0–52.0)
Hemoglobin: 15.6 g/dL (ref 13.0–17.0)
MCH: 32.2 pg (ref 26.0–34.0)
MCHC: 34.8 g/dL (ref 30.0–36.0)
MCV: 92.4 fL (ref 78.0–100.0)
Platelets: 171 10*3/uL (ref 150–400)
RBC: 4.85 MIL/uL (ref 4.22–5.81)
RDW: 12.6 % (ref 11.5–15.5)
WBC: 7 10*3/uL (ref 4.0–10.5)

## 2010-10-21 NOTE — Op Note (Signed)
NAME:  KELAND, PEYTON                       ACCOUNT NO.:  [id number]   MEDICAL RECORD NO.:  [id number]                   PATIENT TYPE:  OIB   LOCATION:  2871                                 FACILITY:  [facility]   PHYSICIAN:  [name]. [name], M.D.               DATE OF BIRTH:  [birth year]   DATE OF PROCEDURE:  12/18/2003  DATE OF DISCHARGE:                                 OPERATIVE REPORT   PROCEDURE:  Electrophysiologic study with radiofrequency catheter ablation  of slow pathway in a patient with documented supraventricular tachycardia at  rates of 170 beats per minute.   I. INTRODUCTION:  The patient is a very pleasant middle aged man with a  history of unexplained syncope.  He underwent implantable loop recorder  insertion and subsequently noted episodes of tachy palpitations lasting up  to an hour in duration.  These were associated with a narrow QRS tachycardia  at rates of 170 beats per minute.  These would typically start with a  premature atrial contraction.  He is now referred for electrophysiologic  study and catheter ablation.   II. PROCEDURE:  After informed consent was obtained the patient was taken to  the diagnostic EP laboratory in the fasting state. After the usual  preparation and draping, intravenous Fentanyl and midazolam were given for  sedation. A 6 French hexapolar catheter was inserted percutaneously into the  right jugular vein and advanced to the coronary sinus.  A 5 French  quadripolar catheter was inserted percutaneously in the right femoral vein  and advanced to the RV apex.  A 5 French quadripolar catheter was inserted  percutaneously in the right femoral vein and advanced to the His bundle  region.  A 7 French quadripolar catheter was inserted percutaneously in the  right femoral vein and advanced to the right atrium.  After measurement of  the basic intervals, rapid ventricular pacing was carried out from the RV  apex demonstrating VA dissociation at a  pacing cycle length of 700  milliseconds.  Next, programmed atrial stimulation was carried out from the  coronary sinus as well as the high right atrium at pacing cycle lengths of  700 milliseconds and stepwise decreased down to 530 milliseconds with AV  Wenckebach was observed. During rapid atrial pacing, the PR interval was  greater than the R interval and there was no inducible supraventricular  tachycardia.  Next, programmed atrial stimulation was carried out from the  coronary sinus with a pacing cycle length of 800 milliseconds.  The S1 and  S2 interval was stepwise decreased down to 410 milliseconds with an AV node  ERP was observed.  During programmed atrial stimulation there were multiple  AH jumps and echo beats noted.  There was no inducible supraventricular  tachycardia.  Next, Isoproterenol at 1, 2 and 3 mcg per minute was infused.  Additional rapid atrial and ventricular pacing as well as programmed atrial  and ventricular  stimulation were carried out.  During rapid ventricular  pacing, the VA conduction returned on Isoproterenol demonstrating midline  decremental atrial activation.  There was no inducible tachycardia.  Additional programmed atrial stimulation was carried out as well as rapid  atrial pacing and again this demonstrated multiple AH jumps and echo beats,  but no inducible supraventricular tachycardia. 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  A total of six RF energy applications were delivered  to the slow pathway region resulting in accelerated junctional rhythm.  Following this, additional rapid atrial pacing was carried out with and  without Isoproterenol and there  was no inducible supraventricular  tachycardia and no evidence of any residual slow pathway conduction.  At  this point the catheters were removed, hemostasis was assured and the  patient was returned to his room in satisfactory condition.   III. COMPLICATIONS:  There were no immediate procedure complications.   IV. RESULTS:  A.  Baseline EKG.  The baseline EKG demonstrates normal sinus  rhythm with normal axes and intervals.  There was no ventricular pre-  excitation at baseline.  B.  Baseline intervals:  The pacing cycle length was 959 milliseconds, the  PR interval 193 milliseconds, the QRS duration 84 milliseconds, the HB  interval 60 milliseconds.  C.  Rapid ventricular pacing:  The rapid ventricular pacing from the RV apex  at baseline demonstrated VA dissociation at a pacing cycle length of 700  milliseconds.  During Isoproterenol infusion there was additional rapid  ventricular pacing demonstrating VA conduction with midline decremental VA  conduction demonstrative of no accessory pathway conduction.  D.  Programmed ventricular stimulation.  Programmed ventricular stimulation  was carried out on Isoproterenol at pacing cycle length of 600 milliseconds.  The S1-S2 interval was stepwise decreased to ventricular refractions.  The  atrial activation was midline and there was no inducible tachycardia during  programmed ventricular stimulation on Isoproterenol.  E.  Rapid atrial pacing:  Rapid atrial pacing was carried out from the high  right atrium as well as the coronary sinus at pacing cycle lengths of 700,  600, 500 and 400 milliseconds.  The S1-S2 interval was stepwise decreased  down to the AV node ERP.  During programmed atrial stimulation there were  multiple AH jumps both on and off Isoproterenol.  However, there is no  inducible sustained tachycardia. F.  Rapid atrial pacing:  Rapid atrial pacing was carried out from the high  right atrium as well as the coronary sinus  length at a pacing cycle length  of 700 milliseconds and stepwise decreased down to 530 milliseconds where AV  Wenckebach was observed.  Additional decrements on Isoproterenol were  carried out down to the AV Wenckebach cycle length.  G.  G rhythm is observed.  There is no inducible tachycardia during the  study.  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  H.  Mapping:  Mapping of the Calot triangle demonstrated the usual size and  orientation.  A. RF energy application:  A total of six RF energy applications were  delivered.  During RF energy application there was accelerated junctional     rhythm.  Following the RF energy application there was no slow pathway     conduction.   V. CONCLUSION:  This study demonstrates successful electrophysiologic study  in slow pathway modification with a total of six RF energy  applications  delivered to the slow pathway region in Calot triangle. Following  catheterization there was no residual slow pathway conduction.  There was no  inducible arrhythmia.                                               [name]. [name], M.D.    GWT/MEDQ  D:  12/18/2003  T:  12/18/2003  Job:  [id number]   cc:   Armanda Magic, M.D.  [address]  [place], [place] [address]  Fax: [phone]

## 2010-10-21 NOTE — Op Note (Signed)
   NAME:  Peter Dunlap, Peter Dunlap                       ACCOUNT NO.:  [id number]   MEDICAL RECORD NO.:  [id number]                   PATIENT TYPE:  OIB   LOCATION:  2899                                 FACILITY:  [facility]   PHYSICIAN:  [name]. [name], M.D. [facility]           DATE OF BIRTH:  26-Dec-1955   DATE OF PROCEDURE:  06/18/2002  DATE OF DISCHARGE:                                 OPERATIVE REPORT   PROCEDURES PERFORMED:  Insertion of implantable loop recorder.   INDICATION:  Unexplained syncope in the setting of a patient with  nonobstructive coronary disease, recurrent syncope, history of cocaine use,  and preserved LV systolic function.   PROCEDURE:  After informed consent was obtained, the patient was taken to  the diagnostic EP lab in a fasting state. After the usual preparation and  draping, a total of 30 cubic centimeter of lidocaine was infiltrated into  the left precordial region.  A 4 cm incision was carried out over this  region and electrocautery utilized to dissect down to the fascial plane.  A  subcutaneous pocket was then fashioned with electrocautery, and the  Medtronic implantable loop recorder was subsequently placed in the  subcutaneous pocket.  The serial number on the loop recorder was [id number].  With the loop recorder inserted, it was secured to fascial plane with two  silk sutures. Kanamycin irrigation was utilized to irrigate the incision.  Electrocautery was utilized to assure hemostasis.  The incision was closed  with a layer of 2-0 Vicryl followed by a layer of 3-0 Vicryl followed by a  layer of 4-0 Vicryl.  Benzoin was painted on the skin and Steri-Strips were  applied and a pressure dressing placed and the patient returned to his room  in satisfactory condition.   COMPLICATIONS:  There were no immediate procedure complications.   RESULTS:  This demonstrates successful insertion of a Medtronic Reveal  implantable loop recorder in a patient with recurrent  unexplained syncope  with unclear etiology.                                               [name]. [name], M.D. [facility]    GWT/MEDQ  D:  06/18/2002  T:  06/18/2002  Job:  [id number]   cc:   Armanda Magic, M.D.  [address]  [place], [place] [address]  Fax: [phone]

## 2010-10-21 NOTE — Op Note (Signed)
NAME:  Peter Dunlap, Peter Dunlap NO.:  [id number]   MEDICAL RECORD NO.:  [id number]          PATIENT TYPE:  AMB   LOCATION:  ENDO                         FACILITY:  [facility]   PHYSICIAN:  Anselmo Rod, M.D.  DATE OF BIRTH:  1956/01/09   DATE OF PROCEDURE:  04/02/2006  DATE OF DISCHARGE:                                 OPERATIVE REPORT   PROCEDURE PERFORMED:  Screening colonoscopy.   ENDOSCOPIST:  Anselmo Rod, M.D.   INSTRUMENT USED:  Olympus video colonoscope.   INDICATION FOR PROCEDURE:  [age]-year-old African American male undergoing  screening colonoscopy.  The patient has a history of rectal bleeding with  hard stools, rule out colon polyps, masses, etc.   PREPROCEDURE PREPARATION:  Informed consent was procured from the patient.  The patient was fasted for 8 hours prior to the procedure and prepped with a  gallon of TriLyte the night prior to the procedure.  Two days prior to the  procedure, he used a gallon of HalfLytely to facilitate a better prep.   PREPROCEDURE PHYSICAL:  VITAL SIGNS:  The patient had stable vital signs.  NECK:  Supple.  CHEST:  Clear to auscultation.  CARDIAC:  S1 and S2 regular.  ABDOMEN:  Soft with normal bowel sounds.   DESCRIPTION OF THE PROCEDURE:  The patient was placed in the left lateral  decubitus position and sedated with 75 mcg of fentanyl and 7.5 mg of Versed  in slow incremental doses.  Once the patient was adequately sedate and  maintained on low-flow oxygen and continuous cardiac monitoring, the Olympus  video colonoscope was advanced from the rectum to the cecum.  There was a  large amount of residual stool in the right side.  Multiple washings were  done and small internal hemorrhoids were seen on retroflexion.  No masses,  polyps, erosions or ulcerations or diverticula were identified.  The patient  tolerated the procedure well without immediate complications.   IMPRESSION:  1. Normal colonoscopy up to the cecum, no  masses, polyps or diverticula      seen.  2. There is some residual stool in the right colon; raised small lesions      could be missed.  3. Small internal hemorrhoids seen on retroflexion.   RECOMMENDATIONS:  1. Continue a high-fiber diet with liberal fluid intake.  2. Avoid the usual laxatives like Cascara and senna.  3. Colace 100 mg to 200 mg at bedtime.  4. Outpatient followup as needed arises in the future.  5. Repeat colonoscopy in the next 5 years, unless the patient develops any      abnormal symptoms in the interim.  The patient had been given 5 units      of Narcan for repeat colonoscopy, as his prep was somewhat suboptimal      this time around.      Anselmo Rod, M.D.  Electronically Signed     JNM/MEDQ  D:  04/02/2006  T:  04/03/2006  Job:  [id number]   cc:   Alvester Morin, M.D.

## 2010-10-21 NOTE — H&P (Signed)
NAME:  Peter Dunlap, Peter Dunlap                       ACCOUNT NO.:  [id number]   MEDICAL RECORD NO.:  [id number]                   PATIENT TYPE:  INP   LOCATION:  A226                                 FACILITY:  [facility]   PHYSICIAN:  Gracelyn Nurse, M.D.              DATE OF BIRTH:  10/27/1955   DATE OF ADMISSION:  04/26/2002  DATE OF DISCHARGE:                                HISTORY & PHYSICAL   CHIEF COMPLAINT:  Syncope.   HISTORY OF PRESENT ILLNESS:  This is a 55 year old black male who presents  after a syncopal episode today.  He was sitting down, playing cards.  He  suddenly passed out, awoke on the floor, and knew where he was and  recognized people around him.  He tried to get up and passed out again.  He  had no seizure activity, no chest pain.  He did say he had some brief  shortness of breath.  He reports having three previous episodes.  One  episode was before he was even being treated for hypertension.  He recently  had a heart catheterization at [facility] which showed  nonobstructive coronary artery disease and is being medically managed.   PAST MEDICAL HISTORY:  1. History of three previous syncopal episodes.  2. Nonobstructive coronary artery disease.  3. Hypertension.  4. GERD.  5. Hyperlipidemia.  6. Hiatal hernia.   ALLERGIES:  No known drug allergies.   MEDICATIONS:  1. Enteric-coated aspirin 81 mg q.d.  2. Lopressor 12.5 mg b.i.d.  3. Protonix 40 mg q.d.  4. Imdur 30 mg q.d.  5. Nitroglycerin 0.4 mg sublingual p.r.n.   SOCIAL HISTORY:  He smokes about one-half pack of cigarettes a day.  Does  not drink alcohol.  Drugs:  He does marijuana.  He denies cocaine use;  however, his sister says he does smoke crack.  He is married.  Currently  unemployed.   FAMILY HISTORY:  Mother, age 59, has hypertension.  Father died at age 36 of  an MI and had diabetes.  Has a sister who has diabetes and hypertension.   REVIEW OF SYSTEMS:  As per HPI.  He does  experience occasional blurred  vision and has occasional dysphagia.  All other systems reviewed and are  normal.   PHYSICAL EXAMINATION:  VITAL SIGNS:  Temperature 98.4, pulse 97,  respirations 24.  Blood pressure lying down is 95/52 with a pulse of 66,  sitting it is 92/47 with a pulse of 68, standing it is 91/64 with a pulse of  65.  GENERAL:  Well-nourished black male in no acute distress.  HEENT:  Pupils are equal, round, and reactive to light.  Extraocular  movements intact.  Oral mucosa is moist.  Oropharynx is clear.  CARDIOVASCULAR:  Regular rate and rhythm.  With no murmurs.  LUNGS:  Clear to auscultation.  ABDOMEN:  Soft, nontender, nondistended.  Bowel sounds are positive.  EXTREMITIES:  No edema.  NEUROLOGIC:  Cranial nerves II-XII grossly intact.  No focal deficits.  Strength is 5/5 bilaterally.  SKIN:  Moist.  With no rash.   ADMISSION LABORATORY DATA:  White blood cell count 8.4, hemoglobin 16.0,  platelets 201.  Sodium 140, potassium 3.6, chloride 104, CO2 30, BUN 10,  creatinine 1.3, glucose 131.  CK 110, MB fraction 1.3, troponin I 0.01.   EKG shows left ventricular hypertrophy, some flipped T-waves in the lateral  leads.   ASSESSMENT AND PLAN:  Problem 1:  Syncope.  He has had repeated episodes of  these.  He had extensive cardiac workup including heart catheterization two  months ago.  We are going to check carotid Dopplers, put him on telemetry,  rule out myocardial infarction.  If we do not find any problems, he will  likely need to be referred for an EP study.  He is currently a little bit  hypotensive, but he did have syncopal episodes before being treated for  hypertension, and he was not orthostatic with blood pressure and pulse.   Problem 2:  Hypertension.  As noted above, his blood pressure is a little  bit low tonight.  Will hold his antihypertensives.  He may not be able to  tolerate them or the Imdur.  Will see how the blood pressure responds in  the  morning and may restart medications slowly.                                                Gracelyn Nurse, M.D.    JDJ/MEDQ  D:  04/26/2002  T:  04/27/2002  Job:  [id number]

## 2010-10-21 NOTE — Discharge Summary (Signed)
NAME:  Peter Dunlap, Peter Dunlap                       ACCOUNT NO.:  [id number]   MEDICAL RECORD NO.:  [id number]                   PATIENT TYPE:  INP   LOCATION:  A226                                 FACILITY:  [facility]   PHYSICIAN:  Hanley Hays. Dechurch, M.D.           DATE OF BIRTH:  04-Jan-1956   DATE OF ADMISSION:  04/26/2002  DATE OF DISCHARGE:  04/29/2002                                 DISCHARGE SUMMARY   DISCHARGE DIAGNOSES:  1. Syncope.  2. Nonsustained ventricular tachycardia.  3. Nonobstructive coronary artery disease by catheterization September 2003.  4. Hypertension.  5. Gastroesophageal reflux disease.  6. Hyperlipidemia.  7. Tobacco abuse.  8. History of cocaine use.   STUDIES:  Ultrasound, carotid duplex, bilateral carotid plaque with right  internal carotid producing 50% luminal stenosis without significant flow  reduction.  Echocardiogram with mild to moderate LVH, small base at the  inferior wall that is essentially akinetic.  Otherwise, normal regional  global systolic function, normal IVC.  EKG revealed normal sinus rhythm,  rates in the 60s with T inversions in V4-V6 which were unchanged during the  hospital stay.   MEDICATIONS:  1. Toprol XL 50 mg daily.  2. Zetia 10 mg daily.  3. Verapamil SR 240 mg daily.  4. Coated aspirin 81 mg daily.  5. Imdur 30 mg daily.   FOLLOW UP:  The patient is to follow up with Dr. Mayford Knife as scheduled on  December 13.  Follow up with Dr. Meredith Pel in 3-4 weeks at the [facility].  The patient was instructed to come to the emergency room  should symptoms recur.   HOSPITAL COURSE:  The patient is a 55 year old African American gentleman  who presented to the emergency room after a syncopal episode which occurred  while he was playing cards.  He felt blurred vision and lightheaded prior to  this occurring.  There was no evidence of seizure activity.  Review of  systems pertinent for several other episodes, plus the  patient complained of  fluttering x many years intermittently, particularly with stress or  exercise, though he had never had this evaluated.  The patient had recently  undergone LV catheterization in September for chest pain and EKG changes at  that time.  Final impression was nonobstructive coronary artery disease in  the major epicardial branch with the worst of these in the proximal left  anterior branch descending up to 50%.  There was a 90% ostial lesion in the  first septal perforator; however, it was felt that it was too small for  intervention.  The patient had no further symptoms during his hospital stay.  He did have a documented run of nonsustained ventricular tachycardia that  was asymptomatic.  The patient had been on Toprol prior to his admission.  This was held the first two days secondary to low blood pressures.  The  patient also was noted to have drug  screen positive for THC, as well as  cocaine.  Apparently, he uses cocaine on an intermittent basis.  The issues  of drug effects on arrhythmia were reviewed with the patient, as well as  other exacerbating factors.  The patient was started on verapamil, as it was  suspected that this was possibly coronary spasm, particularly in the setting  of cocaine use.  He tolerated the verapamil well.  His EKG again revealed J-  point elevation in V1-V2, which apparently was new since July 2003, as well  as the T inversions in V4-V6.  The patient was evaluated by [facility]  Cardiology.  It was felt that he was stable for discharge.  His cardiac  enzymes were normal during the hospital stay.  It was felt that a stress  test would complete his evaluation for ischemic disease, and again this will  be deferred to Dr. Mayford Knife.  It was also felt that EP studies were not  indicated at this time; however, should he have recurrent episodes, that may  be an option to consider.  The findings of the studies and plan were  discussed with the patient,  who seemed to have reasonable understanding.  He  stated he was able to provide for his own medications and he was given a  prescription for verapamil.  He was to continue his Toprol and Imdur as he  was as an outpatient, as well as the Zetia, which he had plenty of samples  of.   PHYSICAL EXAMINATION:  GENERAL:  At the time of discharge, a well-developed,  well-nourished gentleman in no distress.  Alert and appropriate,  asymptomatic.  VITAL SIGNS:  Blood pressure 135/70, pulse 60 and regular, respirations are  unlabored.  NECK:  Supple.  No JVD or adenopathy.  LUNGS:  Clear to auscultation.  HEART:  Regular rate and rhythm.  No murmur.  ABDOMEN:  Obese, soft, nontender.  EXTREMITIES:  Without clubbing, cyanosis, or edema.  NEUROLOGIC:  Intact.   ASSESSMENT AND PLAN:  As noted above.  Should there be any questions or  concerns, please do not hesitate to contact me through [facility]  at [phone] or beeper [phone].                                               Hanley Hays Josefine Class, M.D.    FED/MEDQ  D:  04/29/2002  T:  04/29/2002  Job:  [id number]   cc:   Ileana Roup, M.D.  [address], [place] [address]  Fax: [phone]   Armanda Magic, M.D.  [address]  [place], [place] [address]  Fax: [phone]

## 2010-10-21 NOTE — Cardiovascular Report (Signed)
NAME:  Peter Dunlap, Peter Dunlap                       ACCOUNT NO.:  [id number]   MEDICAL RECORD NO.:  [id number]                   PATIENT TYPE:  INP   LOCATION:  4708                                 FACILITY:  [facility]   PHYSICIAN:  Meade Maw, MD                   DATE OF BIRTH:  [birth year]   DATE OF PROCEDURE:  DATE OF DISCHARGE:  02/19/2002                              CARDIAC CATHETERIZATION   INDICATIONS FOR PROCEDURE:  Chest pain with ECG changes.   DESCRIPTION OF PROCEDURE:  After obtaining written informed consent, the  patient was brought to the cardiac catheterization laboratory in the  postabsorptive state. Preoperative sedation was achieved using IV Versed.  The right groin was prepped and draped in the usual sterile fashion. Local  anesthesia was achieved using 1% Xylocaine. A 6 French hemostasis sheath was  placed into the right femoral artery using the modified Seldinger technique.  Selective coronary angiography was performed using a JL4, JR4 Judkins  catheter. Multiple views were obtained. All catheters were exchanged over a  guide wire. Single plane ventriculogram was not performed secondary to  malfunction in equipment. Pullback was obtained. Following the procedure,  there was no identifiable critical disease.  The patient was transferred to  the holding area. Hemostasis was achieved using digital pressure.   FINDINGS:  There was no gradient noted on pullback.   CORONARY ANGIOGRAPHY:  Left main coronary artery:  The left main coronary artery is short and  bifurcates into the left anterior descending and circumflex vessel. There is  no significant disease in the left main coronary artery.   Left anterior descending:  The left anterior descending gives rise to a  small diagonal #1, moderate diagonal #2 and large diagonal #3. It then goes  on to end as a tapering apical branch.  There is sequential lesions in the  proximal LAD of up to 40-50%.  The first septal  perforator has a 90% ostial  lesion as well.   Circumflex vessel:  The circumflex vessel is a large vessel, gives rise to a  large trifurcating OM-1, a large OM-2.  There are luminal irregularities in  the circumflex vessels up to 30%.   Right coronary artery:  The right coronary artery is a large dominant  artery, gives rise to a large first bifurcating right ventricle marginal  branch, a small second marginal branch, a large PDA and two large  posterolateral branches. There are luminal irregularities in the right  coronary artery and its branches of up to 40% in the proximal vessel.   FINAL IMPRESSION:  Nonobstructive coronary artery disease in the major  epicardial branch with the worst disease noted in the proximal left anterior  descending of up to 50%. There additionally is a 90% ostial lesion in the  first septal perforator. However, this vessel is too small to intervene on.    RECOMMENDATIONS:  Secondary risk reduction with  tobacco avoidance, control  of hypertension, exercise and treatment of LDL to 100.                                                   Meade Maw, MD    HP/MEDQ  D:  02/19/2002  T:  02/20/2002  Job:  [id number]

## 2010-10-21 NOTE — Discharge Summary (Signed)
NAME:  Peter Dunlap, Peter Dunlap                       ACCOUNT NO.:  [id number]   MEDICAL RECORD NO.:  [id number]                   PATIENT TYPE:  OIB   LOCATION:  3701                                 FACILITY:  [facility]   PHYSICIAN:  Learta Codding, M.D. [facility]             DATE OF BIRTH:  1955/07/31   DATE OF ADMISSION:  12/18/2003  DATE OF DISCHARGE:  12/19/2003                                 DISCHARGE SUMMARY   PRIMARY CARDIOLOGIST:  Dr. Sharrell Ku.   BRIEF HISTORY:  Mr. Gruenwald had been seen in the office recently by Dr.  [name] for recurrent supraventricular tachycardia and a history of syncope  with unknown etiology.  He had recommended electrophysiology study and  possible catheter ablation at that time.  The patient was admitted for this  on December 18, 2003.  This procedure was performed on December 18, 2003, by Dr.  [name].  He underwent an EP study and radiofrequency ablation.  Findings  were no SVT, dual AV nodal physiology, no AP conduction, __________  association at baseline.  See dictated electrophysiology procedure note for  full details.   In the a.m. on December 19, 2003, Mr. Hopkin was felt to be stable for discharge  home.   MEDICATIONS:  Prior to admission:  1. Aspirin 81 mg daily.  2. Zocor 20 mg daily.  3. Protonix 40 mg daily.  4. Toprol 25 mg daily.  5. Verapamil 240 mg daily.   ALLERGIES:  No known drug allergies.   The patient's Toprol and verapamil were held during hospitalization  considering EP ablation study.  The patient reports that he was on  metoprolol for his blood pressure prior to having problems with SVT.  He  states the verapamil was more recently started to control his heart rate.  Therefore, at discharge, we will discontinue the verapamil and continue him  on his Toprol as before admission.   DISCHARGE MEDICATIONS:  1. Aspirin 81 mg daily.  2. Zocor 20 mg daily.  3. Protonix 40 mg daily.  4. Toprol as before admission.  5. Stop verapamil.  6. The  patient will need antibiotics prior to any procedure such as dental     for the next three to six months.  He is to call [phone] for further     instructions if this is necessary.  7. He is to take Tylenol as needed for pain.   DISCHARGE INSTRUCTIONS:  1. Activities:  He is refrain from any heavy lifting, heavy exertion, or     sexual activity for the next one week.  2. Diet:  He will follow a low-fat, low-cholesterol diet.  3. He can wash his catheterization site with soap and water.  He is not to     soak in a tub or swim for the next 5-7 days.  4. He will call [phone] for problems with the catheterization site     including swelling, bleeding  or severe pain.  5. He will follow up with Dr. [name] in our office in 6-8 weeks.  We will     call him with an appointment for that.   DISCHARGE DIAGNOSES:  1. Supraventricular tachycardia, status post AV nodal ablation.  2. Hypertension.  3. Hyperlipidemia.  4. Gastroesophageal reflux disorder.      Amy Mercy Riding, P.A. [facility]                     Learta Codding, M.D. [facility]    AB/MEDQ  D:  12/19/2003  T:  12/20/2003  Job:  [id number]

## 2010-10-21 NOTE — Discharge Summary (Signed)
NAME:  Peter Dunlap, Peter Dunlap                       ACCOUNT NO.:  [id number]   MEDICAL RECORD NO.:  [id number]                   PATIENT TYPE:  INP   LOCATION:  4708                                 FACILITY:  [facility]   PHYSICIAN:  Ileana Roup, MD                 DATE OF BIRTH:  April 07, 1956   DATE OF ADMISSION:  02/14/2002  DATE OF DISCHARGE:  02/19/2002                                 DISCHARGE SUMMARY   DISCHARGE DIAGNOSES:  1. Nonobstructive coronary artery disease.  2. Gastroesophageal reflux disease.  3. Hypertension.  4. Tobacco abuse.  5. Chronic cocaine abuse.  6. Trichomonas.  7. Urinary tract infection.   DISCHARGE MEDICATIONS:  1. Enteric-coated aspirin 81 mg p.o. q.d.  2. Lopressor 12.5 mg p.o. b.i.d.  3. Protonix 40 mg p.o. q.d.  4. Imdur 30 mg p.o. q.d.  5. Sublingual nitroglycerin 0.4 mg sublingual q.29min. x3.  If the patient     continues to have chest pain, he should seek further medical advise.   PROCEDURES:  1. The patient did undergo a cardiac catheterization while in the hospital.     For full report, please see the dictated summary.  Overall impressions     were noncritical disease in the mitral valve or coronary arteries.  There     was a third septal perforator with 90% occlusion that was too small to     intervene.  There was 40-50% proximal left anterior descending disease.  2. The patient also underwent a 2-D echocardiogram performed while in the     hospital.  This demonstrated an ejection fraction of 55-65%.  The study     was not sufficient to adequately demonstrate wall motion abnormalities.     There was mild left ventricular thickening.   CONSULTANTS:  The patient was consulted on by Dr. Armanda Magic with [facility].  We appreciate her assistance in the patient's care.   ADMISSION HISTORY:  The patient is a 55 year old African American male with  past medical history significant for hypertension, tobacco abuse, crack  cocaine abuse, and  family history significant for early MI, who presented to  the outpatient clinic for a routine check of his gastroesophageal reflux  disease and hypertension.  The patient reported a two- to three-month  history of chest pain that is different from his usual GERD.  He described  the pain as a pressure/squeezing pain in his chest, primarily in the center,  sometimes radiating to the left and into the left arm.  The patient denied  any radiation to his neck or jaw.  Associated with the pain, the patient has  had some nausea but no emesis.  He also reports some shortness of breath and  diaphoresis.  The pain is exacerbated by exertion and relieved with rest.  He has had episodes approximately two times per week and recently has begun  having the episodes at rest  in addition to exertion.  The patient did not  received any relief in his symptoms with Maalox or Rolaids.   PHYSICAL EXAMINATION:  VITAL SIGNS:  Physical exam on admission reveals a  pulse 71, blood pressure 125/83, temperature 98.3, respiratory rate 20.  O2  saturations 99%.  GENERAL:  Nontoxic appearing, in no acute distress, alert and oriented x3,  pleasant.  HEENT/NECK:  No carotid bruits were appreciated.  No  lymphadenopathy was appreciated.  No JVD was appreciated.  RESPIRATORY:  Clear to auscultation bilaterally.  CARDIOVASCULAR:  Regular rate and rhythm without murmurs, gallops or rubs.  ABDOMEN:  Benign.  EXTREMITIES:  No clubbing, cyanosis, or edema.  Dorsalis pedis pulses 2+  were appreciated.  No femoral bruits were appreciated.  NEUROLOGIC:  Strength 5/5 in bilateral upper extremities.  Cranial nerves II-  XII are intact bilaterally.  No focal sensory abnormalities are appreciated.   LABORATORY AND ACCESSORY CLINICAL DATA ON ADMISSION:  White blood count 7.7,  hemoglobin 15.8, hematocrit 46.3, platelets 203,000.  Sodium 137, potassium  3.6, chloride 107, bicarb 26, BUN 10, creatinine 1.0, glucose 97, calcium  9.1.  A  urinalysis was performed which revealed a small bilirubin, trace  blood and moderate leukocytes.  Urine microscopy revealed 11 to 28 wbc's,  rare bacteria and Trichomonas.  Initial cardiac enzymes were CK 177, MB 1.8,  troponin I of 0.03.   EKG was performed which revealed normal sinus rhythm with a rate of 77.  There was a normal axis and normal intervals.  There were marked T wave  inversions in leads V2 through V6, I, II, and aVL; there was also ST  depression in the lateral leads.   HOSPITAL COURSE:  #1 -  NONOBSTRUCTIVE CORONARY ARTERY DISEASE:  The patient  was admitted to the hospital.  He was placed on telemetry.  Cardiac enzymes  were checked.  We checked an EKG; we also checked a chest x-ray.  We treated  the patient medically with aspirin, beta blocker, heparin and Plavix.  We  obtained a cardiology consult; they came and evaluated the patient.  They  felt that he would benefit from a 2-D echocardiogram and also a cardiac  catheterization; these were performed and the results are described above.  Given the nonobstructive coronary artery disease, no primary intervention  was required, therefore, we will continue to manage his risk factors  appropriately.   #2 -  HYPERTENSION:  The patient's blood pressure was well-controlled while  in the hospital.  He will be discharged on Lopressor 12.5 mg p.o. b.i.d.   #3 -  POLYSUBSTANCE ABUSE:  The patient was counseled extensively regarding  his tobacco and cocaine abuse.  He was given information regarding  cessation.  He was also set up for outpatient followup for these issues.  Also, I will follow up with the patient on these at discharge.   #4 -  URINARY TRACT INFECTION:  The patient was treated with ciprofloxacin  x3 days for a urinary tract infection.   #5 -  TRICHOMONAS:  The patient was treated with 2 g of Flagyl p.o. x1.  He was instructed to inform his sexual partners of this diagnosis and have them  treated as well prior  further sexual intercourse.   #6 -  GASTROESOPHAGEAL REFLUX DISEASE:  The patient was treated with  Protonix and Maalox while in the hospital.  He did occasionally experience  some burning in the chest but did not have any further chest pain.  DISPOSITION:  The patient is discharged home in good condition with his  family.  He is to refrain from weightbearing, heavy lifting or driving until  Saturday, then he has no restriction of the activity level and may continue  his pre-hospital activity level.   DIET:  The patient is encouraged to take a low-fat, low-cholesterol diet.   SPECIAL DISCHARGE INSTRUCTIONS:  He is instructed to call Dr. Norris Cross  office or be evaluated by a physician if he begins to experience increased  swelling or bleeding in his groin.   FOLLOWUP:  The patient is to follow up with Dr. Mayford Knife on Thursday, March 06, 2002, at 9 a.m.  He was given directions to this effect.  He is also to  follow up with [place] in the outpatient clinic in approximately one week after  his visit with Dr. Mayford Knife.   ISSUES OF FOLLOWUP:  Certainly, we will want to continue to monitor his  cardiac risk factors.  We will ask that Dr. Mayford Knife check a CMET when she  sees him in followup.  He was started on Zocor for a mildly elevated LDL  while in the hospital.  A CMET was checked several days after the initiation  of this medication and did reveal mildly elevated liver enzymes.  We will  recheck a CMET in two weeks to establish his baseline LFTs.  We will restart  him on Zocor at that time.  I will also continue to speak with the patient  regarding his polysubstance abuse and his progress in clinic.  The patient  is instructed to call [phone] if he has not heard from [place] in the next week  to schedule an appointment in the outpatient clinic.   DISCHARGE LABORATORY DATA:  Final notable labs were ordered on February 19, 2002 and were as follows:  White blood count 6.7, hemoglobin 14.3,   hematocrit 42.9, platelets 167,000.  PTT 97.  A BMET drawn on February 18, 2002 revealed sodium of 141, potassium 3.5, chloride 106, bicarb 25, BUN 9,  creatinine 1.0, glucose 82.     Kennith Gain, MD                    Ileana Roup, MD    CM/MEDQ  D:  02/19/2002  T:  02/22/2002  Job:  [id number]   cc:   Armanda Magic, MD  [address]  [place], [place] [address]  Fax: [phone]   [facility]

## 2010-10-21 NOTE — Procedures (Signed)
   NAME:  [name], [name] NO.:  [id number]   MEDICAL RECORD NO.:  [id number]                  PATIENT TYPE:   LOCATION:                                       FACILITY:   PHYSICIAN:  [place] Bing, M.D. [facility]           DATE OF BIRTH:   DATE OF PROCEDURE:  04/28/2002  DATE OF DISCHARGE:                                  ECHOCARDIOGRAM   CLINICAL DATA:  A 55 year old gentleman with mild coronary disease, had  recent cardiac catheterization; history of drug abuse; now with syncope.   IMPRESSION:  1. A technically adequate echocardiographic study.  2. Normal left atrium, right atrium, and right ventricle.  3. Mild aortic valvular sclerosis with normal valve function.  4. Normal mitral and pulmonic valves.  5. Normal internal dimension of the left ventricle; mild-to-moderate LVH.     There is a small area at the base of the inferior wall that is virtually     akinetic.  Otherwise, regional and global LV systolic function are     normal.  6. Normal IVC.                                               [place] Bing, M.D. [facility]    RR/MEDQ  D:  04/28/2002  T:  04/28/2002  Job:  [id number]

## 2010-10-21 NOTE — Op Note (Signed)
NAMECOLE, EASTRIDGE NO.:  [id number]   MEDICAL RECORD NO.:  [id number]          PATIENT TYPE:  OIB   LOCATION:  6501                         FACILITY:  [facility]   PHYSICIAN:  [name]. [name], M.D.  DATE OF BIRTH:  June 04, 1956   DATE OF PROCEDURE:  05/18/2004  DATE OF DISCHARGE:                                 OPERATIVE REPORT   ELECTROPHYSIOLOGIC PROCEDURE NOTE:  Mr. Daniel returns today for followup.  Mr. Salais comes in today for removal of his implantable loop recorder.   1.  Introduction.  The patient is a very pleasant middle-aged male with a      history of unexplained syncope who was found with an implantable loop      recorder to have SVT at a rate of 170 b.p.m.  He is status post catheter      ablation for this.  He has had no recurrent syncope.  He has had no      recurrent arrhythmias or bradycardia and is now at loop recorder end-of-      life and is referred for removal of his implantable loop recorder.   1.  After informed consent was obtained, the patient was taken to the      diagnostic EP lab in the fasting state.  After the usual preparation and      draping, a total of 30 mL of lidocaine was infiltrated in the left      infraclavicular region.  A 3 cm incision was carried out over this      region, electrocautery utilized to dissect down to the loop recorder.      It was removed with gentle traction.  The silk suture was removed as      well.  Kanamycin irrigation was utilized to irrigate the pocket and      electrocautery utilized to assure hemostasis.  The incision was then      closed with a layer of 2-0 Vicryl, followed by a layer of 3-0 Vicryl,      followed by a layer of 4-0 Vicryl.  Benzoin was painted on the skin,      Steri-Strips were applied and a pressure dressing placed and the patient      returned to his room in satisfactory condition.   1.  Complications.  There were no immediate procedure complications.   1.  Results.  This  demonstrated successful removal of implantable loop      recorder in a patient with a history of SVT and unexplained syncope.       GWT/MEDQ  D:  05/18/2004  T:  05/18/2004  Job:  [id number]   cc:   Armanda Magic, M.D.  [address]  [place], [place] [address]  Fax: [phone]

## 2010-11-01 ENCOUNTER — Ambulatory Visit (INDEPENDENT_AMBULATORY_CARE_PROVIDER_SITE_OTHER): Payer: Medicare Other | Admitting: Vascular Surgery

## 2010-11-01 DIAGNOSIS — I6529 Occlusion and stenosis of unspecified carotid artery: Secondary | ICD-10-CM

## 2010-11-01 NOTE — Assessment & Plan Note (Signed)
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  He is well-known to me from a prior right carotid endarterectomy for severe asymptomatic disease in February 2010.  He had done well since his surgery.  He recently was admitted to [facility] with a syncopal episode.  I do have the discharge summary for this for review.  He had an extensive evaluation at that time, including a negative CT scan.  He did have a duplex which showed diminished flow in his left vertebral with normal flow in his right vertebral artery.  I have reviewed his scan as well, and this shows widely patent right endarterectomy and no evidence of significant left carotid disease.  It is felt potentially to be cardiac etiology for his syncopal episodes as well.  He does report that he has a persistent headache following this episode.  PAST HISTORY:  Significant for hypertension, elevated cholesterol, and prior myocardial infarction.  He is married with no children.  He does smoke a half pack of cigarettes per day.  Does not drink alcohol.  PHYSICAL EXAMINATION:  A well-developed, moderately obese black male in no acute distress.  Blood pressure is 114/81, pulse 81, respirations 22. He does not have carotid bruits.  His radial pulses are 2+.  He has a well-healed right carotid incision.  I did review his prior MAR from 2010.  At that time, this was of his brain.  He did have diminished flow in his left vertebral and normal flow in his right vertebral and basilar artery.  I discussed this with patient and explained that his finding of his left vertebral diminished flow in all likelihood, I explained that this is no change from his prior studies and this may be developmental or he could have some occlusive disease in his  left vertebral artery.  I explained that this is irrelevant since he has normal flow through his basilar artery.  I do not feel that his symptoms are related to vertebrobasilar insufficiency. He will continue to follow-up with his medical doctor for further workup of his syncope and see [place] on a yearly basis with a duplex to rule out any change in his carotid disease.    Larina Earthly, M.D. Electronically Signed  TFE/MEDQ  D:  11/01/2010  T:  11/01/2010  Job:  [id number]  cc:   Melvyn Novas, MD

## 2010-11-03 NOTE — Consult Note (Signed)
NAME:  Peter Dunlap, Peter Dunlap NO.:  [id number]  MEDICAL RECORD NO.:  [id number]           PATIENT TYPE:  I  LOCATION:  3019                         FACILITY:  [facility]  PHYSICIAN:  Cassell Clement, M.D. DATE OF BIRTH:  1956/01/15  DATE OF CONSULTATION:  10/19/2010 DATE OF DISCHARGE:                                CONSULTATION   PRIMARY CARDIOLOGIST:  [name]. [name], MD  PRIMARY CARE PROVIDER:  Dr. [name].  REASON FOR CONSULTATION:  Chest pain and presyncope in a patient with known coronary artery disease as well as peripheral vascular disease.  PAST MEDICAL HISTORY: 1. 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 2. Hypertension. 3. Hypercholesterolemia. 4. Diastolic heart failure per echocardiogram in March 2012. 5. Carotid artery disease status post cardiac catheterization in     February 2010 with 70% to 80% mid-LAD stenosis as well as further     nonobstructive coronary artery disease. 6. SVT status post ablation in 2005 by Dr. [name]. 7. Polysubstance abuse (tobacco, alcohol, marijuana, and cocaine). 8. History of syncope. 9. Gastroesophageal reflux disease. 10.Depression. 11.Erectile dysfunction. 12.Implantable loop recorder placed in 2004 and removed in 2005.  HISTORY OF PRESENT ILLNESS:  This is a 55 year old African American gentleman with known coronary artery disease, diastolic congestive heart failure, and a history of syncope who presents with presyncope as well as chest pain.  The patient was recently evaluated for a similar episode in August 2011 upon discharge, this was felt to be secondary to polysubstance abuse.  An outpatient stress test was scheduled.  The patient completed this on February 03, 2010, but was unable to reach a target heart rate, only 86%.  At 86% target heart rate, the patient noted no inducible wall motion abnormalities.  The patient states since that time he has been careful in standing in  order not to pass out.  He does state that he has only used nitroglycerin handful at times since August.  Until 4 days ago when the patient experienced chest pressure while at rest, it was relieved with nitroglycerin and again yesterday the patient was walking approximately 50 yards and began to have chest pressure associated with nausea, pain radiating down his left arm as well as dizziness.  The patient had to stop and rest 3 times prior to making it home.  With the chest pain and the presyncope, the patient presented to the emergency department for further evaluation.  In the emergency department, the patient's vital signs were stable.  He underwent CT of the head as well as MRI that showed no acute abnormalities.  The patient was then admitted by the [facility].  He is ruled out for myocardial infarction at this time.  His EKG is without acute changes from prior tracing.  A carotid Doppler did show left vertebral artery with minimal flow as well as diastolic flow.  The patient states that he is currently chest pain free.  He does feel mildly dizzy upon standing, orthostatics were negative.  He does have some tingling in his left arm.  The patient also endorses orthopnea and PND at home.  Cardiology was asked to evaluate the patient for further recommendations.  SOCIAL HISTORY:  The patient lives at home with his wife.  He endorses tobacco abuse.  He continues to smoke approximately one pack a day.  He also drinks alcohol socially.  He has history of marijuana and cocaine abuse, but states his last time of use was approximately 3 weeks ago.  FAMILY HISTORY:  Noncontributory for early coronary artery disease.  His mother has cerebrovascular disease and hypertension.  Father died at age of 53 with myocardial infarction and diabetes.  He has siblings with diabetes mellitus and hypertension.  ALLERGIES:  No known drug allergies.  INPATIENT MEDICATIONS: 1. Norvasc 5 mg daily. 2.  Aspirin 81 mg daily. 3. Clonidine 0.1 mg three times a day. 4. Lovenox 40 mg subcutaneously daily. 5. Flonase daily. 6. Hydrochlorothiazide 12.5 mg daily. 7. Protonix 40 mg twice daily. 8. Crestor 20 mg daily. 9. Flomax 0.4 mg daily.  REVIEW OF SYSTEMS:  All pertinent positives and negatives as stated in HPI as well as cough.  All other systems have been reviewed and are negative.  PHYSICAL EXAMINATION:  VITAL SIGNS:  Temperature 98.6, pulse 64, respirations 17, blood pressure 119/77, O2 saturation 100% on room air. GENERAL:  This is a well-developed, well-nourished African American gentleman.  He is in no acute distress. HEENT:  Normal. NECK:  Supple without bruit or JVD. HEART:  Regular rate and rhythm with S1 and S2.  There is a 2/6 systolic murmur noted.  Pulses are 2+ and equal bilaterally. LUNGS:  Clear to auscultation bilaterally without wheezes, rales, or rhonchi. ABDOMEN:  Soft, nontender, positive bowel sounds x4, obese. EXTREMITIES:  No clubbing, cyanosis, or edema. MUSCULOSKELETAL:  No joint deformities or effusions. NEURO:  Alert and oriented x3, cranial nerves II through XII are grossly intact.CT of the head without contrast showing stable examination and no acute intracranial findings.  MRI of the head without contrast is negative. There is minimal anterior sinus disease noted.  EKG shows sinus rhythm at a rate of 74 beats per minute.  There is left ventricular hypertrophy noted.  There are T-wave inversions in leads I, aVL, V3 through V6. This tracing appears unchanged from prior tracing in August 2011. Repolarization abnormalities noted.  LABORATORY DATA:  WBC is 8.2, hemoglobin 16.8, hematocrit 47.2, platelets 178.  Sodium 138, potassium 4.1, chloride 105, bicarb 25, BUN 14, creatinine 1.2.  Cardiac enzymes negative x2.  Point-of-care markers negative x1.  Urine drug screen negative.  ASSESSMENT AND PLAN:  This is a 55 year old gentleman with known coronary  artery disease by prior catheterization 2010, who presents with symptoms of presyncope and exertional chest pressure.  The patient's carotid Dopplers this admission has been abnormal.  At this time, there is no evidence of HOCM on 2-D echocardiogram this admission, but severe left ventricular hypertrophy with strain.  Further discussion with Vascular Surgery needs to be discussed for possible carotid revascularization.  At this time, the patient's chest pain is worrisome for unstable angina, therefore, we will proceed with cardiac catheterization to evaluate further.  Risks, benefits, and indications have been discussed with the patient and his family, they agree to proceed.  Further treatment will be dependent upon these results.  Thank you for this consultation.  We will continue to follow along with you.     Leonette Monarch, PA-C   ______________________________ Cassell Clement, M.D.    NB/MEDQ  D:  10/19/2010  T:  10/20/2010  Job:  [id number]  Electronically Signed  by Alen Blew P.A. on 11/01/2010 10:14:18 AM Electronically Signed by Cassell Clement M.D. on 11/03/2010 12:47:54 PM

## 2010-11-03 NOTE — Discharge Summary (Signed)
NAME:  Peter Dunlap, Peter Dunlap NO.:  [id number]  MEDICAL RECORD NO.:  [id number]           PATIENT TYPE:  I  LOCATION:  3019                         FACILITY:  [facility]  PHYSICIAN:  Altha Harm, MDDATE OF BIRTH:  [birth year]  DATE OF ADMISSION:  10/18/2010 DATE OF DISCHARGE:  10/21/2010                              DISCHARGE SUMMARY   DISCHARGE DISPOSITION:  Home.  DISCHARGE DIAGNOSES: 1. Acute coronary syndrome - medical management. 2. Coronary artery disease. 3. Dizziness, resolved. 4. Vertebrobasilar insufficiency. 5. Peripheral arterial disease, status post right carotid     endarterectomy. 6. Hypertension. 7. Hyperlipidemia. 8. Obesity, body mass index greater than 40. 9. Tobacco use disorder. 10.Benign prostatic hypertrophy. 11.Low back pain. 12.Chronic diastolic dysfunction. 13.Supraventricular tachycardia, status post ablation, now     asymptomatic. 14.Cervical spinal stenosis. 15.Sinusitis. DISCHARGE MEDICATIONS: 1. Amlodipine 2.5 mg p.o. daily. 2. Plavix 75 mg p.o. daily with meals. 3. Aspirate aspirin 81 mg p.o. daily. 4. Clonidine 0.1 mg p.o. b.i.d. 5. Hydrochlorothiazide 12.5 mg p.o. daily. 6. Jalyn 0.5/0.4 mg p.o. daily. 7. Nitroglycerin sublingual 0.4 mg sublingual q.5 minutes up to x3 as     needed for pain. 8. Omeprazole 20 mg p.o. daily. 9. Zocor 40 mg p.o. daily. 10.Zithromax 250 mg p.o. daily x4 days.  DISCONTINUED MEDICATIONS: 1. Aleve 220 mg 1-2 tablets q.8 h. p.r.n. 2. Amlodipine 5 mg p.o. daily.  CONSULTANTS: 1. [facility]. 2. Phone consultation with Dr. Edilia Bo being a vascular surgery.  PROCEDURES:  Status post cardiac cath on Oct 20, 2010, by Dr. Elease Hashimoto which showed moderate coronary artery disease, not amenable to PTCA. Recommendations for medical management.  DIAGNOSTIC STUDIES: 1. CT of the head without contrast which shows stable examination.  No     acute intracranial findings. 2. MRA of the brain  without contrast which was negative for MRA of the     brain.  Minimal anterior sinus disease. 3. CT of the C-spine without contrast which shows no acute bony     abnormality.  There was advanced degeneration and spondylosis of     the cervical spine.  Moderate spinal stenosis at C5 and C4 and mild     to moderate spinal stenosis at C3, C4, C5, C6 of the most severe     levels. 4. Two-D echocardiogram done on Oct 19, 2010, which shows left     ventricular cavity size normal, wall thickness increase in the     pattern of severe LVH, systolic function vigorous with an ejection     fraction of 65-70% and features consistent with grade 2 diastolic     dysfunction. 5. The patient had a carotid duplex which showed no evidence of     significant ICA stenosis bilaterally.  The site of prior CEA on the     right appears patent.  Right vertebral artery flow is antegrade.     Left vertebral artery appears to have minimal flow, is stumpy, and     has been no diastolic flow.  PRIMARY CARE PHYSICIAN:  Delbert Harness, MD  CODE STATUS:  Full code.  ALLERGIES:  No known drug allergies.  CHIEF COMPLAINT:  Dizziness.  HISTORY OF PRESENT ILLNESS:  Please refer to the H and P dictated by Dr. Jordan Hawks for details of the chief complaint.  However, in short, this is a 55 year old gentleman with a history of known coronary artery disease who stated that while he was up ambulating, he started having some dizziness.  The patient states that he rested, got up, and started walking again and he continued to have further dizziness.  Additionally, the patient was having exertional chest pain.  The patient presented to the emergency room for evaluation and [facility] were consulted for further evaluation and management.  HOSPITAL COURSE: 1. Dizziness:  The patient certainly had many reasons preexisting for     causes of dizziness.  There was some concern as to whether or not     the patient might have been  having electrical abnormality in     addition to an acute coronary syndrome contributing to his     dizziness.  Investigations were done and the patient was found to     have possibly an occluded left vertebral artery.  The carotid     duplex did show that the patient has antegrade right vertebral     artery flow, however, the left vertebral artery appeared to have     minimal flow, is stumpy, and has no diastolic flow.  This can     certainly give the patient vertebrobasilar insufficiency.  However,     with the patent right vertebral artery, it is unclear that this is     completely the cause.  However, I discussed this with Dr. Edilia Bo     of Vein and Vascular Surgery who was in agreement that the patient     is at minimal risk being discharged with patent right vertebral     artery and recommends that he follows up in the office.  The     patient is already an established patient for [facility] and will follow up with Dr. Edilia Bo or Dr. Arbie Cookey     in the office to further address this as an outpatient. 2. Acute coronary syndrome with known coronary artery disease.  The     patient underwent a cardiac catheterization and was found to have     moderate coronary artery disease.  However, it is felt that the     occlusion of the LAD is not suited for a PCI.  The patient was     recommended for medical management with the addition of Plavix 75     mg daily.  The patient had been on aspirin 325 mg, however,  with     the addition of Plavix the aspirin has been decreased to 81 mg     daily. 3. Acute sinusitis.  The patient did complain of frontal headaches.     CT scan revealed sinusitis.  The patient will be treated with a Z-     Pak for this. 4. Cervical spinal stenosis.  This could certainly contribute to the     patient's headaches that he is having and to his dizziness and if     he continues to have pain in that area will likely need to see an      orthopedic surgeon later in his clinical course.  Otherwise, the     patient's other problems remained stable.  The patient was     counseled against further tobacco use.  This  is related to small     vessel disease.  In addition, he was counseled against any     substance abuse and use.  CONDITION AT TIME OF DISCHARGE:  Stable.  PHYSICAL EXAMINATION:  VITAL SIGNS:  Temperature is 97.7, heart rate 60, blood pressure 120/75, respiratory rate 18, and O2 sats are 99% on room air. HEENT:  He is normocephalic and atraumatic.  Pupils equally round and reactive to light and accommodation.  Extraocular movements are intact. Oropharynx is moist.  Exudate, erythema, or lesions are noted. NECK:  The trachea is midline.  No masses, no thyromegaly, no JVD, no carotid bruits. RESPIRATORY:  He has a normal respiratory effort, equal excursion bilaterally.  No wheezing, no rhonchi noted. CARDIOVASCULAR:  He has got a normal S1 and S2.  No murmurs, rubs, or gallops noted.  PMI is nondisplaced.  No heaves or thrills on palpation. ABDOMEN:  Obese, soft, nontender, and nondistended.  No masses, no hepatosplenomegaly. EXTREMITIES:  No clubbing, cyanosis, or edema. NEUROLOGICAL:  The patient has no focal neurological deficits.  Cranial nerves II-XII are grossly intact. PSYCHIATRIC:  He is alert and oriented x3.  Good insight and cognition. Good recent and remote recall.  DIETARY RESTRICTIONS:  The patient should be on a heart-healthy diet.  PHYSICAL RESTRICTIONS:  Activity as tolerated.  FOLLOWUP:  The patient is to follow up with Dr. Delbert Harness in 1 week.  He is also to follow up with Dr. Arbie Cookey or Dr. Edilia Bo in the office within 5 days for followup appointment regarding his vertebral artery occlusion.  Total time for the discharge process including face-to-face time approximately 40 minutes.     Altha Harm, MD     MAM/MEDQ  D:  10/21/2010  T:  10/21/2010  Job:  [id number]  cc:    Di Kindle. Edilia Bo, M.D. Larina Earthly, M.D.  Electronically Signed by Marthann Schiller MD on 11/03/2010 09:23:03 AM

## 2010-11-04 NOTE — Cardiovascular Report (Signed)
  NAME:  [name], [name] NO.:  [id number]  MEDICAL RECORD NO.:  [id number]           PATIENT TYPE:  I  LOCATION:  3019                         FACILITY:  [facility]  PHYSICIAN:  Vesta Mixer, M.D. DATE OF BIRTH:  Sep 19, 1955  DATE OF PROCEDURE:  10/20/2010 DATE OF DISCHARGE:                           CARDIAC CATHETERIZATION   Peter Dunlap is a 55 year old gentleman with a history of moderate coronary artery disease, hypertension, obesity, and cigarette smoking. He was admitted with episodes of dizziness.  He also complained of some left-sided chest discomfort.  He is ruled out for myocardial infarction.  He is referred for heart catheterization for further evaluation.  The right femoral artery was easily cannulated using a modified Seldinger technique.  HEMODYNAMICS:  LV pressure is 129/12.  The aortic pressure is 123/73.  Angiography of left main.  The left main has minor luminal irregularities.  The left anterior descending artery has mild-to-moderate irregularities in the proximal segment.  There is a 50% stenosis in the mid vessel. This is unchanged from his previous heart catheterization in 2010. There is a moderate-sized diagonal vessel that has mild luminal irregularities.  The distal LAD has mild-to-moderate diffuse irregularities.  The left circumflex artery:  The left circumflex artery has mild disease throughout its course.  It gives off a fairly large first obtuse marginal artery which has minor luminal irregularities.  The distal circumflex also has minor irregularities without critical stenosis.  The right coronary artery is very large and is dominant.  There is a 50% stenosis at the ostium and proximal vessel.  The mid vessel has 1 slight area of ectasia and then followed by a 30% stenosis.  The remaining mid and distal right coronary artery have minor irregularities.  There is a very complex lesion at the distal aspect of the RCA right  at the bifurcation.  The RCA divides into a posterolateral branch and a double posterior descending artery.  There is a 70% stenosis in the distal right coronary artery right at the proximal edge of the posterior descending artery.  The posterior lateral branch has a 60% stenosis. There is a secondary posterior descending artery that has tight 70% stenosis.  This has progressed since his previous cath in 2010.  His left ventriculogram was performed in a 30 RAO position.  It reveals a well-preserved left ventricular systolic function.  The ejection fraction is 65%.  I have reviewed the films with Dr. [place].  He and I agreed that medical therapy is probably best for this patient.  The right coronary artery is fairly hazy and would be a very complicated PCI.  The patient has a history of polysubstance abuse.  We will treat him medically and we will consider angioplasty if he fails medical therapy.  He needs to stop smoking.  He may need this started on Plavix.     Vesta Mixer, M.D.     PJN/MEDQ  D:  10/20/2010  T:  10/20/2010  Job:  [id number]  Electronically Signed by Kristeen Miss M.D. on 11/04/2010 04:47:19 PM

## 2010-12-18 ENCOUNTER — Emergency Department (HOSPITAL_COMMUNITY)
Admission: EM | Admit: 2010-12-18 | Discharge: 2010-12-19 | Disposition: A | Discharge status: Home / Self Care | Payer: Medicare Other | Attending: Emergency Medicine | Admitting: Emergency Medicine

## 2010-12-18 DIAGNOSIS — S41009A Unspecified open wound of unspecified shoulder, initial encounter: Secondary | ICD-10-CM | POA: Insufficient documentation

## 2010-12-18 DIAGNOSIS — I2510 Atherosclerotic heart disease of native coronary artery without angina pectoris: Secondary | ICD-10-CM | POA: Insufficient documentation

## 2010-12-18 DIAGNOSIS — W1789XA Other fall from one level to another, initial encounter: Secondary | ICD-10-CM | POA: Insufficient documentation

## 2010-12-18 DIAGNOSIS — S41119A Laceration without foreign body of unspecified upper arm, initial encounter: Secondary | ICD-10-CM

## 2010-12-18 DIAGNOSIS — Z79899 Other long term (current) drug therapy: Secondary | ICD-10-CM | POA: Insufficient documentation

## 2010-12-18 DIAGNOSIS — I10 Essential (primary) hypertension: Secondary | ICD-10-CM | POA: Insufficient documentation

## 2010-12-18 DIAGNOSIS — F17200 Nicotine dependence, unspecified, uncomplicated: Secondary | ICD-10-CM | POA: Insufficient documentation

## 2010-12-18 DIAGNOSIS — S43006A Unspecified dislocation of unspecified shoulder joint, initial encounter: Secondary | ICD-10-CM

## 2010-12-19 ENCOUNTER — Emergency Department (HOSPITAL_COMMUNITY): Payer: Medicare Other

## 2010-12-19 ENCOUNTER — Encounter (HOSPITAL_COMMUNITY): Payer: Self-pay | Admitting: *Deleted

## 2010-12-19 MED ORDER — ACETAMINOPHEN 500 MG PO TABS
1000.0000 mg | ORAL_TABLET | Freq: Once | ORAL | Status: AC
  Administered 2010-12-19: 1000 mg via ORAL
  Filled 2010-12-19: qty 2

## 2010-12-19 MED ORDER — HYDROCODONE-ACETAMINOPHEN 5-500 MG PO TABS: 1.0000 | ORAL_TABLET | Freq: Four times a day (QID) | ORAL | Status: AC | PRN

## 2010-12-19 MED ORDER — TETANUS-DIPHTH-ACELL PERTUSSIS 5-2.5-18.5 LF-MCG/0.5 IM SUSP
0.5000 mL | Freq: Once | INTRAMUSCULAR | Status: AC
  Administered 2010-12-19: 0.5 mL via INTRAMUSCULAR
  Filled 2010-12-19: qty 0.5

## 2010-12-19 MED ORDER — TETANUS-DIPHTHERIA TOXOIDS TD 5-2 LFU IM INJ
INJECTION | INTRAMUSCULAR | Status: AC
  Filled 2010-12-19: qty 0.5

## 2010-12-19 MED ORDER — DOUBLE ANTIBIOTIC 500-10000 UNIT/GM EX OINT: TOPICAL_OINTMENT | Freq: Once | CUTANEOUS | Status: DC

## 2010-12-19 MED ORDER — HYDROCODONE-ACETAMINOPHEN 5-325 MG PO TABS
2.0000 | ORAL_TABLET | Freq: Once | ORAL | Status: AC
  Administered 2010-12-19: 1 via ORAL
  Filled 2010-12-19: qty 2

## 2010-12-19 NOTE — ED Provider Notes (Addendum)
History     Chief Complaint  Patient presents with  . Extremity Laceration    Pt fell off deck and caught himself with his right arm. pt c/o right shoulder pain and punture wound that has moderate amt of bleeding to left shoulder underarm area. Pressure dressing applied. Pt unable tto move right shoulder or arm very much.  . Shoulder Injury   HPI Comments: Patient was outside getting ready to feed his new puppy and tripped over the dog's chain falling off the porch. He sustained a laceration to left shoulder. Patient is on plavix and was unable to get the  1 cm laceration to stop bleeding. When he fell he caught himself with his right arm. Now with pain to the right shoulder. Pain is worse with movement. Denies weakness, numbness, tingling.   Patient is a 55 y.o. male presenting with shoulder injury. The history is provided by the patient.  Shoulder Injury This is a new problem. The current episode started 1 to 2 hours ago. The problem has been gradually worsening. The symptoms are relieved by nothing. He has tried nothing for the symptoms.    Past Medical History  Diagnosis Date  . Hypertension   . CAD (coronary artery disease)   . MI (myocardial infarction)   . S/P carotid endarterectomy     History reviewed. No pertinent past surgical history.  History reviewed. No pertinent family history.  History  Substance Use Topics  . Smoking status: Current Everyday Smoker  . Smokeless tobacco: Not on file  . Alcohol Use:       Review of Systems  All other systems reviewed and are negative.    Physical Exam  BP 150/133  Pulse 107  Temp 98.3 F (36.8 C)  Resp 20  Ht 6' (1.829 m)  Wt 280 lb (127.007 kg)  BMI 37.97 kg/m2  SpO2 97%  Physical Exam  Constitutional: He is oriented to person, place, and time. He appears well-developed and well-nourished.  HENT:  Head: Normocephalic and atraumatic.  Eyes: EOM are normal. Pupils are equal, round, and reactive to light.  Neck:  Normal range of motion. Neck supple.  Cardiovascular: Normal rate, normal heart sounds and intact distal pulses.   Pulmonary/Chest: Effort normal and breath sounds normal.  Abdominal: Soft. Bowel sounds are normal.  Musculoskeletal:       Right shoulder with no deformity, no crepitus, ROM is slightly limited by discomfort.  Neurological: He is alert and oriented to person, place, and time. He has normal reflexes.  Skin:       1 cm laceration to anterior shoulder, bleeding controlled.    ED Course  LACERATION REPAIR Date/Time: 12/19/2010 1:52 AM Performed by: Annamarie Dawley. Authorized by: Annamarie Dawley Consent: Verbal consent not obtained. Risks and benefits: risks, benefits and alternatives were discussed Patient understanding: patient states understanding of the procedure being performed Patient consent: the patient's understanding of the procedure matches consent given Body area: upper extremity Location details: left shoulder Laceration length: 1 cm Tendon involvement: none Patient sedated: no Irrigation solution: saline Amount of cleaning: standard Debridement: none Degree of undermining: none Wound skin closure material used: staple x 1. Approximation: close Approximation difficulty: simple Dressing: antibiotic ointment and pressure dressing    MDM       Nicoletta Dress. Colon Branch, MD 12/19/10 0131  Nicoletta Dress. Colon Branch, MD 12/19/10 [id number]

## 2010-12-26 ENCOUNTER — Encounter (HOSPITAL_COMMUNITY): Payer: Self-pay | Admitting: Emergency Medicine

## 2010-12-26 ENCOUNTER — Emergency Department (HOSPITAL_COMMUNITY)
Admission: EM | Admit: 2010-12-26 | Discharge: 2010-12-26 | Disposition: A | Discharge status: Home / Self Care | Payer: Medicare Other | Attending: Emergency Medicine | Admitting: Emergency Medicine

## 2010-12-26 DIAGNOSIS — IMO0002 Reserved for concepts with insufficient information to code with codable children: Secondary | ICD-10-CM

## 2010-12-26 DIAGNOSIS — Z4802 Encounter for removal of sutures: Secondary | ICD-10-CM | POA: Insufficient documentation

## 2010-12-26 NOTE — ED Provider Notes (Signed)
History     Chief Complaint  Patient presents with  . Suture / Staple Removal   Patient is a 55 y.o. male presenting with suture removal. The history is provided by the patient.  Suture / Staple Removal     Past Medical History  Diagnosis Date  . Hypertension   . CAD (coronary artery disease)   . MI (myocardial infarction)   . S/P carotid endarterectomy     Past Surgical History  Procedure Date  . Heart ablation     History reviewed. No pertinent family history.  History  Substance Use Topics  . Smoking status: Current Everyday Smoker -- 0.5 packs/day    Types: Cigarettes  . Smokeless tobacco: Not on file  . Alcohol Use: No      Review of Systems  Constitutional: Negative for fatigue.  HENT: Negative for congestion, sinus pressure and ear discharge.   Eyes: Negative for discharge.  Respiratory: Negative for cough.   Cardiovascular: Negative for chest pain.  Gastrointestinal: Negative for abdominal pain and diarrhea.  Genitourinary: Negative for frequency and hematuria.  Musculoskeletal: Negative for back pain.  Skin: Negative for rash.  Neurological: Negative for seizures and headaches.  Hematological: Negative.   Psychiatric/Behavioral: Negative for hallucinations.    Physical Exam  BP 127/98  Pulse 98  Temp(Src) 98.4 F (36.9 C) (Oral)  Resp 18  Ht 6' (1.829 m)  Wt 280 lb (127.007 kg)  BMI 37.97 kg/m2  SpO2 100%  Physical Exam  Constitutional: He is oriented to person, place, and time. He appears well-developed.  HENT:  Head: Normocephalic.  Eyes: Conjunctivae are normal.  Neck: No tracheal deviation present.  Musculoskeletal: Normal range of motion.  Neurological: He is oriented to person, place, and time.  Skin: Skin is warm.       Healed laceration  Psychiatric: He has a normal mood and affect.    ED Course  Procedures Staple removed by nurse.  Lac healed MDM       Benny Lennert, MD 12/26/10 [phone]

## 2010-12-26 NOTE — ED Notes (Signed)
Pt is here to have one staple removed from his rt chest

## 2010-12-30 ENCOUNTER — Inpatient Hospital Stay (INDEPENDENT_AMBULATORY_CARE_PROVIDER_SITE_OTHER)
Admission: RE | Admit: 2010-12-30 | Discharge: 2010-12-30 | Disposition: A | Discharge status: Home / Self Care | Payer: Medicare Other | Source: Ambulatory Visit | Attending: Family Medicine | Admitting: Family Medicine

## 2010-12-30 DIAGNOSIS — IMO0002 Reserved for concepts with insufficient information to code with codable children: Secondary | ICD-10-CM

## 2011-05-28 ENCOUNTER — Emergency Department (HOSPITAL_COMMUNITY)
Admission: EM | Admit: 2011-05-28 | Discharge: 2011-05-28 | Disposition: A | Discharge status: Home / Self Care | Payer: PRIVATE HEALTH INSURANCE | Attending: Emergency Medicine | Admitting: Emergency Medicine

## 2011-05-28 ENCOUNTER — Encounter (HOSPITAL_COMMUNITY): Payer: Self-pay | Admitting: *Deleted

## 2011-05-28 DIAGNOSIS — R42 Dizziness and giddiness: Secondary | ICD-10-CM | POA: Insufficient documentation

## 2011-05-28 DIAGNOSIS — R300 Dysuria: Secondary | ICD-10-CM | POA: Insufficient documentation

## 2011-05-28 DIAGNOSIS — Z79899 Other long term (current) drug therapy: Secondary | ICD-10-CM | POA: Insufficient documentation

## 2011-05-28 DIAGNOSIS — R319 Hematuria, unspecified: Secondary | ICD-10-CM | POA: Insufficient documentation

## 2011-05-28 DIAGNOSIS — M545 Low back pain, unspecified: Secondary | ICD-10-CM | POA: Insufficient documentation

## 2011-05-28 DIAGNOSIS — N390 Urinary tract infection, site not specified: Secondary | ICD-10-CM | POA: Insufficient documentation

## 2011-05-28 DIAGNOSIS — I252 Old myocardial infarction: Secondary | ICD-10-CM | POA: Insufficient documentation

## 2011-05-28 DIAGNOSIS — R10819 Abdominal tenderness, unspecified site: Secondary | ICD-10-CM | POA: Insufficient documentation

## 2011-05-28 DIAGNOSIS — Z7982 Long term (current) use of aspirin: Secondary | ICD-10-CM | POA: Insufficient documentation

## 2011-05-28 DIAGNOSIS — I10 Essential (primary) hypertension: Secondary | ICD-10-CM | POA: Insufficient documentation

## 2011-05-28 DIAGNOSIS — I2510 Atherosclerotic heart disease of native coronary artery without angina pectoris: Secondary | ICD-10-CM | POA: Insufficient documentation

## 2011-05-28 LAB — CBC
HCT: 48.8 % (ref 39.0–52.0)
Hemoglobin: 16.8 g/dL (ref 13.0–17.0)
MCH: 32.1 pg (ref 26.0–34.0)
MCHC: 34.4 g/dL (ref 30.0–36.0)
MCV: 93.1 fL (ref 78.0–100.0)
Platelets: 207 10*3/uL (ref 150–400)
RBC: 5.24 MIL/uL (ref 4.22–5.81)
RDW: 13.4 % (ref 11.5–15.5)
WBC: 8.9 10*3/uL (ref 4.0–10.5)

## 2011-05-28 LAB — URINALYSIS, ROUTINE W REFLEX MICROSCOPIC
Bilirubin Urine: NEGATIVE
Glucose, UA: NEGATIVE mg/dL
Nitrite: NEGATIVE
Protein, ur: NEGATIVE mg/dL
Specific Gravity, Urine: 1.02 (ref 1.005–1.030)
Urobilinogen, UA: 0.2 mg/dL (ref 0.0–1.0)
pH: 6 (ref 5.0–8.0)

## 2011-05-28 LAB — URINE MICROSCOPIC-ADD ON

## 2011-05-28 LAB — DIFFERENTIAL
Basophils Absolute: 0.1 10*3/uL (ref 0.0–0.1)
Basophils Relative: 1 % (ref 0–1)
Eosinophils Absolute: 0.2 10*3/uL (ref 0.0–0.7)
Eosinophils Relative: 2 % (ref 0–5)
Lymphocytes Relative: 33 % (ref 12–46)
Lymphs Abs: 2.9 10*3/uL (ref 0.7–4.0)
Monocytes Absolute: 1 10*3/uL (ref 0.1–1.0)
Monocytes Relative: 11 % (ref 3–12)
Neutro Abs: 4.7 10*3/uL (ref 1.7–7.7)
Neutrophils Relative %: 53 % (ref 43–77)

## 2011-05-28 LAB — BASIC METABOLIC PANEL
BUN: 16 mg/dL (ref 6–23)
CO2: 24 mEq/L (ref 19–32)
Calcium: 9.3 mg/dL (ref 8.4–10.5)
Chloride: 98 mEq/L (ref 96–112)
Creatinine, Ser: 1.34 mg/dL (ref 0.50–1.35)
GFR calc Af Amer: 67 mL/min — ABNORMAL LOW (ref >90)
GFR calc non Af Amer: 58 mL/min — ABNORMAL LOW (ref >90)
Glucose, Bld: 103 mg/dL — ABNORMAL HIGH (ref 70–99)
Potassium: 3.6 mEq/L (ref 3.5–5.1)
Sodium: 131 mEq/L — ABNORMAL LOW (ref 135–145)

## 2011-05-28 LAB — PROTIME-INR
INR: 1.06 (ref 0.00–1.49)
Prothrombin Time: 14 seconds (ref 11.6–15.2)

## 2011-05-28 MED ORDER — CEPHALEXIN 500 MG PO CAPS: 500.0000 mg | ORAL_CAPSULE | Freq: Four times a day (QID) | ORAL | Status: AC

## 2011-05-28 MED ORDER — CEPHALEXIN 500 MG PO CAPS: 500.0000 mg | ORAL_CAPSULE | Freq: Four times a day (QID) | ORAL | Status: DC

## 2011-05-28 NOTE — ED Provider Notes (Signed)
History   This chart was scribed for Peter Baker, MD by Charolett Bumpers . The patient was seen in room APA08/APA08 and the patient's care was started at 10:25am.    CSN: [id number]  Arrival date & time 05/28/11  7829   First MD Initiated Contact with Patient 05/28/11 1021      Chief Complaint  Patient presents with  . Hematuria    (Consider location/radiation/quality/duration/timing/severity/associated sxs/prior treatment) HPI [name] is a 55 y.o. male who presents to the Emergency Department complaining of constant, moderate to severe hematuria since yesterday afternoon with associated dysuria, lightheadedness, lower back pain, and lower abdominal pain. Patient states the blood is bright red, and denies bruising and recent trauma. He has had similar mild symptoms approximately 2 months ago but notes they were much less severe.  Patient stated that he has no h/o kidney stones or kidney problems. Patient stated that he is on Coumadin. Nothing makes his sx better or worse  Past Medical History  Diagnosis Date  . Hypertension   . CAD (coronary artery disease)   . MI (myocardial infarction)   . S/P carotid endarterectomy     Past Surgical History  Procedure Date  . Heart ablation     No family history on file.  History  Substance Use Topics  . Smoking status: Current Everyday Smoker -- 0.5 packs/day    Types: Cigarettes  . Smokeless tobacco: Not on file  . Alcohol Use: No      Review of Systems A complete 10 system review of systems was obtained and is otherwise negative except as noted in the HPI and PMH.   Allergies  Review of patient's allergies indicates no known allergies.  Home Medications   Current Outpatient Rx  Name Route Sig Dispense Refill  . AMLODIPINE BESYLATE 5 MG PO TABS Oral Take 5 mg by mouth daily.      . ASPIRIN 81 MG PO TABS Oral Take 81 mg by mouth daily.      [name] BENAZEPRIL-HYDROCHLOROTHIAZIDE 20-12.5 MG PO TABS Oral Take  1 tablet by mouth daily.      [name] CLONIDINE HCL 0.1 MG PO TABS Oral Take 0.1 mg by mouth 2 (two) times daily.      [name] JALYN PO Oral Take by mouth.      [name] NITROSTAT 0.4 MG SL SUBL  Dissolve one (1) tablet under the tongue at onset of chest pain--may repeat every . for upto 3 doses! 25 each 8  . OMEPRAZOLE 20 MG PO CPDR Oral Take 20 mg by mouth daily.      [name] SIMVASTATIN 40 MG PO TABS Oral Take 40 mg by mouth at bedtime.        BP 132/85  Pulse 95  Temp(Src) 97.8 F (36.6 C) (Oral)  Resp 16  Ht 6' (1.829 m)  Wt 285 lb (129.275 kg)  BMI 38.65 kg/m2  SpO2 100%  Physical Exam  Nursing note and vitals reviewed. Constitutional: He is oriented to person, place, and time. He appears well-developed and well-nourished. No distress.  HENT:  Head: Normocephalic and atraumatic.  Eyes: EOM are normal. Pupils are equal, round, and reactive to light.  Neck: Neck supple. No tracheal deviation present.  Cardiovascular: Normal rate and normal heart sounds.   Pulmonary/Chest: Effort normal and breath sounds normal. No respiratory distress.  Abdominal: Soft. He exhibits no distension. There is tenderness (mild suprapubic tenderness). There is no rebound and no guarding.  Genitourinary:  Testicles normal, glands of penis with dried blood.   Musculoskeletal: Normal range of motion. He exhibits no edema.  Neurological: He is alert and oriented to person, place, and time. No sensory deficit.  Skin: Skin is warm and dry.  Psychiatric: He has a normal mood and affect. His behavior is normal.    ED Course  Procedures (including critical care time) DIAGNOSTIC STUDIES: Oxygen Saturation is 100% on room air, normal by my interpretation.    COORDINATION OF CARE:     Labs Reviewed  URINALYSIS, ROUTINE W REFLEX MICROSCOPIC  URINE CULTURE   No results found.   No diagnosis found.    MDM  Labs and x-rays noted. Suspect patient might have a urinary tract infection. The patient will be  placed on Keflex and given referral to urology I personally performed the services described in this documentation, which was scribed in my presence. The recorded information has been reviewed and considered.          Peter Baker, MD 05/28/11 [phone]

## 2011-05-28 NOTE — ED Notes (Signed)
Pt c/o bleeding from his penis since yesterday. States that it burns a little when he first starts to urinate. Small amt. Dark red bloody drainage noted on tissue that patient had in his underwear. Alert and oriented x 3. Skin warm and dry. Color pink. Denies pain.

## 2011-05-28 NOTE — ED Notes (Signed)
Pt states started bleeding bright red blood yesterday after voiding and has been bleeding ever since.  C/o mild pain with voiding.

## 2011-05-28 NOTE — Discharge Instructions (Signed)
Urinary Tract Infection Infections of the urinary tract can start in several places. A bladder infection (cystitis), a kidney infection (pyelonephritis), and a prostate infection (prostatitis) are different types of urinary tract infections (UTIs). They usually get better if treated with medicines (antibiotics) that kill germs. Take all the medicine until it is gone. You or your child may feel better in a few days, but TAKE ALL MEDICINE or the infection may not respond and may become more difficult to treat. HOME CARE INSTRUCTIONS   Drink enough water and fluids to keep the urine clear or pale yellow. Cranberry juice is especially recommended, in addition to large amounts of water.   Avoid caffeine, tea, and carbonated beverages. They tend to irritate the bladder.   Alcohol may irritate the prostate.   Only take over-the-counter or prescription medicines for pain, discomfort, or fever as directed by your caregiver.  To prevent further infections:  Empty the bladder often. Avoid holding urine for long periods of time.   After a bowel movement, women should cleanse from front to back. Use each tissue only once.   Empty the bladder before and after sexual intercourse.  FINDING OUT THE RESULTS OF YOUR TEST Not all test results are available during your visit. If your or your child's test results are not back during the visit, make an appointment with your caregiver to find out the results. Do not assume everything is normal if you have not heard from your caregiver or the medical facility. It is important for you to follow up on all test results. SEEK MEDICAL CARE IF:   There is back pain.   Your baby is older than 3 months with a rectal temperature of 100.5 F (38.1 C) or higher for more than 1 day.   Your or your child's problems (symptoms) are no better in 3 days. Return sooner if you or your child is getting worse.  SEEK IMMEDIATE MEDICAL CARE IF:   There is severe back pain or lower  abdominal pain.   You or your child develops chills.   You have a fever.   Your baby is older than 3 months with a rectal temperature of 102 F (38.9 C) or higher.   Your baby is 10 months old or younger with a rectal temperature of 100.4 F (38 C) or higher.   There is nausea or vomiting.   There is continued burning or discomfort with urination.  MAKE SURE YOU:   Understand these instructions.   Will watch your condition.   Will get help right away if you are not doing well or get worse.  Document Released: 03/01/2005 Document Revised: 02/01/2011 Document Reviewed: 10/04/2006 [facility] Patient Information 2012 [place], [place].Hematuria, Adult  Return here at once if you aren't unable to urinate Hematuria (blood in your urine) can be caused by a bladder infection (cystitis), kidney infection (pyelonephritis), prostate infection (prostatitis), or kidney stone. Infections will usually respond to antibiotics (medications which kill germs), and a kidney stone will usually pass through your urine without further treatment. If you were put on antibiotics, take all the medicine until gone. You may feel better in a few days, but take all of your medicine or the infection may not respond and become more difficult to treat. If antibiotics were not given, an infection did not cause the blood in the urine. A further work up to find out the reason may be needed. HOME CARE INSTRUCTIONS   Drink lots of fluid, 3 to 4 quarts a  day. If you have been diagnosed with an infection, cranberry juice is especially recommended, in addition to large amounts of water.   Avoid caffeine, tea, and carbonated beverages, because they tend to irritate the bladder.   Avoid alcohol as it may irritate the prostate.   Only take over-the-counter or prescription medicines for pain, discomfort, or fever as directed by your caregiver.   If you have been diagnosed with a kidney stone follow your caregivers instructions  regarding straining your urine to catch the stone.  TO PREVENT FURTHER INFECTIONS:  Empty the bladder often. Avoid holding urine for long periods of time.   After a bowel movement, women should cleanse front to back. Use each tissue only once.   Empty the bladder before and after sexual intercourse if you are a male.   Return to your caregiver if you develop back pain, fever, nausea (feeling sick to your stomach), vomiting, or your symptoms (problems) are not better in 3 days. Return sooner if you are getting worse.  If you have been requested to return for further testing make sure to keep your appointments. If an infection is not the cause of blood in your urine, X-rays may be required. Your caregiver will discuss this with you. SEEK IMMEDIATE MEDICAL CARE IF:   You have a persistent fever over 102 F (38.9 C).   You develop severe vomiting and are unable to keep the medication down.   You develop severe back or abdominal pain despite taking your medications.   You begin passing a large amount of blood or clots in your urine.   You feel extremely weak or faint, or pass out.  MAKE SURE YOU:   Understand these instructions.   Will watch your condition.   Will get help right away if you are not doing well or get worse.  Document Released: 05/22/2005 Document Revised: 02/01/2011 Document Reviewed: 01/09/2008 [facility] Patient Information 2012 [place], [place].

## 2011-05-29 LAB — URINE CULTURE
Colony Count: 3000
Culture  Setup Time: 201212232124

## 2011-09-06 ENCOUNTER — Other Ambulatory Visit: Payer: Self-pay

## 2011-09-06 ENCOUNTER — Emergency Department (HOSPITAL_COMMUNITY): Payer: Medicare Other

## 2011-09-06 ENCOUNTER — Encounter (HOSPITAL_COMMUNITY): Payer: Self-pay | Admitting: *Deleted

## 2011-09-06 ENCOUNTER — Inpatient Hospital Stay (HOSPITAL_COMMUNITY)
Admission: EM | Admit: 2011-09-06 | Discharge: 2011-09-08 | DRG: 315 | Disposition: A | Discharge status: Home / Self Care | Payer: Medicare Other | Attending: Family Medicine | Admitting: Family Medicine

## 2011-09-06 DIAGNOSIS — I959 Hypotension, unspecified: Secondary | ICD-10-CM | POA: Diagnosis present

## 2011-09-06 DIAGNOSIS — F17200 Nicotine dependence, unspecified, uncomplicated: Secondary | ICD-10-CM | POA: Diagnosis present

## 2011-09-06 DIAGNOSIS — Z720 Tobacco use: Secondary | ICD-10-CM | POA: Diagnosis present

## 2011-09-06 DIAGNOSIS — E785 Hyperlipidemia, unspecified: Secondary | ICD-10-CM | POA: Diagnosis present

## 2011-09-06 DIAGNOSIS — R079 Chest pain, unspecified: Secondary | ICD-10-CM | POA: Diagnosis present

## 2011-09-06 DIAGNOSIS — N400 Enlarged prostate without lower urinary tract symptoms: Secondary | ICD-10-CM | POA: Diagnosis present

## 2011-09-06 DIAGNOSIS — J4489 Other specified chronic obstructive pulmonary disease: Secondary | ICD-10-CM | POA: Diagnosis present

## 2011-09-06 DIAGNOSIS — J449 Chronic obstructive pulmonary disease, unspecified: Secondary | ICD-10-CM | POA: Diagnosis present

## 2011-09-06 DIAGNOSIS — I739 Peripheral vascular disease, unspecified: Secondary | ICD-10-CM | POA: Diagnosis present

## 2011-09-06 DIAGNOSIS — I422 Other hypertrophic cardiomyopathy: Secondary | ICD-10-CM | POA: Diagnosis present

## 2011-09-06 DIAGNOSIS — E782 Mixed hyperlipidemia: Secondary | ICD-10-CM | POA: Diagnosis present

## 2011-09-06 DIAGNOSIS — I10 Essential (primary) hypertension: Secondary | ICD-10-CM | POA: Diagnosis present

## 2011-09-06 DIAGNOSIS — N390 Urinary tract infection, site not specified: Secondary | ICD-10-CM | POA: Diagnosis present

## 2011-09-06 DIAGNOSIS — I200 Unstable angina: Secondary | ICD-10-CM | POA: Diagnosis present

## 2011-09-06 DIAGNOSIS — R319 Hematuria, unspecified: Secondary | ICD-10-CM | POA: Diagnosis present

## 2011-09-06 DIAGNOSIS — I2510 Atherosclerotic heart disease of native coronary artery without angina pectoris: Secondary | ICD-10-CM | POA: Diagnosis present

## 2011-09-06 DIAGNOSIS — I110 Hypertensive heart disease with heart failure: Secondary | ICD-10-CM | POA: Diagnosis present

## 2011-09-06 HISTORY — DX: Obesity, unspecified: E66.9

## 2011-09-06 HISTORY — DX: Benign prostatic hyperplasia without lower urinary tract symptoms: N40.0

## 2011-09-06 HISTORY — DX: Hyperlipidemia, unspecified: E78.5

## 2011-09-06 HISTORY — DX: Cerebrovascular disease, unspecified: I67.9

## 2011-09-06 HISTORY — DX: Male erectile dysfunction, unspecified: N52.9

## 2011-09-06 HISTORY — DX: Tobacco use: Z72.0

## 2011-09-06 HISTORY — DX: Depression, unspecified: F32.A

## 2011-09-06 HISTORY — DX: Other psychoactive substance abuse, in remission: F19.11

## 2011-09-06 HISTORY — DX: Gastro-esophageal reflux disease without esophagitis: K21.9

## 2011-09-06 HISTORY — DX: Major depressive disorder, single episode, unspecified: F32.9

## 2011-09-06 HISTORY — DX: Spondylopathy, unspecified: M48.9

## 2011-09-06 HISTORY — DX: Atherosclerotic heart disease of native coronary artery without angina pectoris: I25.10

## 2011-09-06 LAB — BASIC METABOLIC PANEL
BUN: 9 mg/dL (ref 6–23)
CO2: 24 mEq/L (ref 19–32)
Calcium: 9.4 mg/dL (ref 8.4–10.5)
Chloride: 101 mEq/L (ref 96–112)
Creatinine, Ser: 1.13 mg/dL (ref 0.50–1.35)
GFR calc Af Amer: 83 mL/min — ABNORMAL LOW (ref >90)
GFR calc non Af Amer: 71 mL/min — ABNORMAL LOW (ref >90)
Glucose, Bld: 101 mg/dL — ABNORMAL HIGH (ref 70–99)
Potassium: 3.6 mEq/L (ref 3.5–5.1)
Sodium: 137 mEq/L (ref 135–145)

## 2011-09-06 LAB — CARDIAC PANEL(CRET KIN+CKTOT+MB+TROPI)
CK, MB: 4.6 ng/mL — ABNORMAL HIGH (ref 0.3–4.0)
CK, MB: 4.4 ng/mL — ABNORMAL HIGH (ref 0.3–4.0)
Relative Index: INVALID (ref 0.0–2.5)
Relative Index: INVALID (ref 0.0–2.5)
Total CK: 82 U/L (ref 7–232)
Total CK: 67 U/L (ref 7–232)
Troponin I: <0.3 ng/mL (ref <0.30)
Troponin I: <0.3 ng/mL (ref <0.30)

## 2011-09-06 LAB — DIFFERENTIAL
Basophils Absolute: 0.1 10*3/uL (ref 0.0–0.1)
Basophils Relative: 1 % (ref 0–1)
Eosinophils Absolute: 0.1 10*3/uL (ref 0.0–0.7)
Eosinophils Relative: 1 % (ref 0–5)
Lymphocytes Relative: 40 % (ref 12–46)
Lymphs Abs: 3.1 10*3/uL (ref 0.7–4.0)
Monocytes Absolute: 0.6 10*3/uL (ref 0.1–1.0)
Monocytes Relative: 7 % (ref 3–12)
Neutro Abs: 3.9 10*3/uL (ref 1.7–7.7)
Neutrophils Relative %: 51 % (ref 43–77)

## 2011-09-06 LAB — CBC
HCT: 47.5 % (ref 39.0–52.0)
Hemoglobin: 16.7 g/dL (ref 13.0–17.0)
MCH: 32 pg (ref 26.0–34.0)
MCHC: 35.2 g/dL (ref 30.0–36.0)
MCV: 91 fL (ref 78.0–100.0)
Platelets: 176 10*3/uL (ref 150–400)
RBC: 5.22 MIL/uL (ref 4.22–5.81)
RDW: 12.8 % (ref 11.5–15.5)
WBC: 7.7 10*3/uL (ref 4.0–10.5)

## 2011-09-06 LAB — POCT I-STAT TROPONIN I
Troponin i, poc: 0.04 ng/mL (ref 0.00–0.08)
Troponin i, poc: 0.04 ng/mL (ref 0.00–0.08)

## 2011-09-06 LAB — LIPID PANEL
Cholesterol: 155 mg/dL (ref 0–200)
HDL: 46 mg/dL (ref >39)
LDL Cholesterol: 88 mg/dL (ref 0–99)
Total CHOL/HDL Ratio: 3.4 RATIO
Triglycerides: 104 mg/dL (ref <150)
VLDL: 21 mg/dL (ref 0–40)

## 2011-09-06 LAB — HEPARIN LEVEL (UNFRACTIONATED): Heparin Unfractionated: 0.24 [IU]/mL — ABNORMAL LOW (ref 0.30–0.70)

## 2011-09-06 LAB — MRSA PCR SCREENING: MRSA by PCR: NEGATIVE

## 2011-09-06 LAB — MAGNESIUM: Magnesium: 1.8 mg/dL (ref 1.5–2.5)

## 2011-09-06 MED ORDER — HEPARIN BOLUS VIA INFUSION
4000.0000 [IU] | Freq: Once | INTRAVENOUS | Status: AC
  Administered 2011-09-06: 4000 [IU] via INTRAVENOUS

## 2011-09-06 MED ORDER — DEXTROSE 5 % IV SOLN
1.0000 g | INTRAVENOUS | Status: DC
  Administered 2011-09-06 – 2011-09-07 (×2): 1 g via INTRAVENOUS
  Filled 2011-09-06 (×5): qty 10

## 2011-09-06 MED ORDER — HEPARIN (PORCINE) IN NACL 100-0.45 UNIT/ML-% IJ SOLN
10.0000 [IU]/kg/h | INTRAMUSCULAR | Status: DC
  Administered 2011-09-06: 10 [IU]/kg/h via INTRAVENOUS

## 2011-09-06 MED ORDER — TAMSULOSIN HCL 0.4 MG PO CAPS
0.4000 mg | ORAL_CAPSULE | Freq: Every day | ORAL | Status: DC
  Administered 2011-09-06 – 2011-09-08 (×3): 0.4 mg via ORAL
  Filled 2011-09-06 (×5): qty 1

## 2011-09-06 MED ORDER — ATORVASTATIN CALCIUM 40 MG PO TABS: 40.0000 mg | ORAL_TABLET | Freq: Every day | ORAL | Status: DC

## 2011-09-06 MED ORDER — NITROGLYCERIN 0.4 MG SL SUBL: 0.4000 mg | SUBLINGUAL_TABLET | SUBLINGUAL | Status: DC | PRN

## 2011-09-06 MED ORDER — SODIUM CHLORIDE 0.9 % IV SOLN
INTRAVENOUS | Status: DC
  Administered 2011-09-06: 125 mL/h via INTRAVENOUS
  Administered 2011-09-07: 09:00:00 via INTRAVENOUS

## 2011-09-06 MED ORDER — ASPIRIN 81 MG PO CHEW
81.0000 mg | CHEWABLE_TABLET | Freq: Every day | ORAL | Status: DC
  Administered 2011-09-07 – 2011-09-08 (×2): 81 mg via ORAL
  Filled 2011-09-06 (×4): qty 1

## 2011-09-06 MED ORDER — CLONIDINE HCL 0.1 MG PO TABS
0.1000 mg | ORAL_TABLET | Freq: Three times a day (TID) | ORAL | Status: DC
  Administered 2011-09-06: 0.1 mg via ORAL
  Filled 2011-09-06 (×2): qty 1

## 2011-09-06 MED ORDER — NITROGLYCERIN IN D5W 200-5 MCG/ML-% IV SOLN
10.0000 ug/min | INTRAVENOUS | Status: DC
  Administered 2011-09-06: 10 ug/min via INTRAVENOUS
  Filled 2011-09-06: qty 250

## 2011-09-06 MED ORDER — FENTANYL CITRATE 0.05 MG/ML IJ SOLN
100.0000 ug | Freq: Once | INTRAMUSCULAR | Status: AC
  Administered 2011-09-06: 100 ug via INTRAVENOUS
  Filled 2011-09-06: qty 2

## 2011-09-06 MED ORDER — HEPARIN (PORCINE) IN NACL 100-0.45 UNIT/ML-% IJ SOLN
INTRAMUSCULAR | Status: AC
  Administered 2011-09-06: 17:00:00
  Filled 2011-09-06: qty 250

## 2011-09-06 MED ORDER — ASPIRIN 325 MG PO TABS
325.0000 mg | ORAL_TABLET | Freq: Once | ORAL | Status: AC
  Administered 2011-09-06: 325 mg via ORAL
  Filled 2011-09-06: qty 1

## 2011-09-06 MED ORDER — BENAZEPRIL HCL 10 MG PO TABS
20.0000 mg | ORAL_TABLET | Freq: Every day | ORAL | Status: DC
  Administered 2011-09-06 – 2011-09-08 (×2): 20 mg via ORAL
  Filled 2011-09-06 (×5): qty 2

## 2011-09-06 MED ORDER — SODIUM CHLORIDE 0.9 % IV BOLUS (SEPSIS)
500.0000 mL | Freq: Once | INTRAVENOUS | Status: AC
  Administered 2011-09-06: 500 mL via INTRAVENOUS

## 2011-09-06 MED ORDER — NITROGLYCERIN 0.4 MG SL SUBL
0.4000 mg | SUBLINGUAL_TABLET | SUBLINGUAL | Status: AC | PRN
  Administered 2011-09-06 (×3): 0.4 mg via SUBLINGUAL
  Filled 2011-09-06: qty 25

## 2011-09-06 MED ORDER — AMLODIPINE BESYLATE 5 MG PO TABS
5.0000 mg | ORAL_TABLET | Freq: Every day | ORAL | Status: DC
  Administered 2011-09-06 – 2011-09-08 (×2): 5 mg via ORAL
  Filled 2011-09-06 (×4): qty 1

## 2011-09-06 MED ORDER — SIMVASTATIN 20 MG PO TABS
40.0000 mg | ORAL_TABLET | Freq: Every day | ORAL | Status: DC
Start: 2011-09-06 — End: 2011-09-06
  Administered 2011-09-06: 40 mg via ORAL
  Filled 2011-09-06 (×2): qty 2

## 2011-09-06 MED ORDER — METOPROLOL TARTRATE 25 MG PO TABS
25.0000 mg | ORAL_TABLET | Freq: Two times a day (BID) | ORAL | Status: DC
  Administered 2011-09-07 – 2011-09-08 (×3): 25 mg via ORAL
  Filled 2011-09-06 (×4): qty 1

## 2011-09-06 NOTE — Consult Note (Signed)
Patient Name: Peter Dunlap  MRN: [id number]  HPI: Peter Dunlap is an 56 y.o. malereferred for consultation by Dr. Janna Arch for chest pain.  Patient has a history of moderate coronary disease with significant lesions restricted to complex disease of the distal RCA that was not ideal for percutaneous intervention when last assessed in 10/2010.  He has multiple cardiovascular risk factors including hypertension, hyperlipidemia, long-standing tobacco abuse and cocaine use.  He now presents with 3 days of chest discomfort exacerbated by movement of the trunk and associated with dyspnea and nausea.  The discomfort as moderately severe, left-sided, characterized as a pressure radiating to the left arm and waxes and wanes.  He has used nitroglycerin without relief.  Elected to come to the emergency department when the pain became severe.  Following initial treatment, he now feels nearly back to normal.  First set of cardiac markers is normal.  Past Medical History  Diagnosis Date  . Hypertension     Severe LVH with normal EF  . Arteriosclerotic cardiovascular disease (ASCVD) 2005    catheterization in 10/2010:50% mid LAD, diffuse distal disease, circumflex irregularities, large dominant RCA with a 50% ostial, 70% distal, 60% posterolateral and 70% PDA; normal EF  . Cerebrovascular disease 2010    R. carotid endarterectomy; Duplex in 10/2010-widely patent ICAs, subtotal left vertebral-not thought to be contributing to symptoms  . Hyperlipidemia   . Obesity   . Tobacco abuse   . Benign prostatic hypertrophy   . Low back pain   . History of PSVT (paroxysmal supraventricular tachycardia) 2005    Diagnosed with ILR; no recurrence following RFA  . Cervical spine disease     CT in 2012-advanced degeneration and spondylosis with moderate spinal stenosis at C3-C6  . H/O: substance abuse     Cocaine, marijuana, alcohol  . Syncope   . Gastroesophageal reflux disease   . Depression   . Erectile  dysfunction     Past Surgical History  Procedure Date  . Radiofrequency ablation 2005    for PSVT    Family History  Problem Relation Age of Onset  . Hypertension Mother     Cerebrovascular disease  . Coronary artery disease Father     Fatal MI  . Diabetes type II Father     also a sibling  . Hypertension Other     sibling  . Lung cancer Other     Social History:  reports that he has been smoking Cigarettes.  He has a 40 pack-year smoking history. He does not have any smokeless tobacco history on file. He reports that he uses illicit drugs (Cocaine). He reports that he does not drink alcohol.  Allergies: No Known Allergies  Medications:  I have reviewed the patient's current medications. Scheduled:   . amLODipine  5 mg Oral Daily  . aspirin  81 mg Oral Daily  . aspirin  325 mg Oral Once  . benazepril  20 mg Oral Daily  . cefTRIAXone (ROCEPHIN)  IV  1 g Intravenous Q24H  . cloNIDine  0.1 mg Oral TID  . fentaNYL  100 mcg Intravenous Once  . heparin      . heparin  4,000 Units Intravenous Once  . metoprolol tartrate  25 mg Oral BID  . simvastatin  40 mg Oral q1800  . sodium chloride  500 mL Intravenous Once  . Tamsulosin HCl  0.4 mg Oral Daily   Results for orders placed during the hospital encounter of 09/06/11 (from the past  48 hour(s))  POCT I-STAT TROPONIN I     Status: Normal   Collection Time   09/06/11  1:20 PM      Component Value Range Comment   Troponin i, poc 0.04  0.00 - 0.08 (ng/mL)    Comment 3            BASIC METABOLIC PANEL     Status: Abnormal   Collection Time   09/06/11  1:30 PM      Component Value Range Comment   Sodium 137  135 - 145 (mEq/L)    Potassium 3.6  3.5 - 5.1 (mEq/L)    Chloride 101  96 - 112 (mEq/L)    CO2 24  19 - 32 (mEq/L)    Glucose, Bld 101 (*) 70 - 99 (mg/dL)    BUN 9  6 - 23 (mg/dL)    Creatinine, Ser 9.56  0.50 - 1.35 (mg/dL)    Calcium 9.4  8.4 - 10.5 (mg/dL)    GFR calc non Af Amer 71 (*) >90 (mL/min)    GFR calc Af  Amer 83 (*) >90 (mL/min)   CBC     Status: Normal   Collection Time   09/06/11  1:30 PM      Component Value Range Comment   WBC 7.7  4.0 - 10.5 (K/uL)    RBC 5.22  4.22 - 5.81 (MIL/uL)    Hemoglobin 16.7  13.0 - 17.0 (g/dL)    HCT 21.3  08.6 - 57.8 (%)    MCV 91.0  78.0 - 100.0 (fL)    MCH 32.0  26.0 - 34.0 (pg)    MCHC 35.2  30.0 - 36.0 (g/dL)    RDW 46.9  62.9 - 52.8 (%)    Platelets 176  150 - 400 (K/uL)   DIFFERENTIAL     Status: Normal   Collection Time   09/06/11  1:30 PM      Component Value Range Comment   Neutrophils Relative 51  43 - 77 (%)    Neutro Abs 3.9  1.7 - 7.7 (K/uL)    Lymphocytes Relative 40  12 - 46 (%)    Lymphs Abs 3.1  0.7 - 4.0 (K/uL)    Monocytes Relative 7  3 - 12 (%)    Monocytes Absolute 0.6  0.1 - 1.0 (K/uL)    Eosinophils Relative 1  0 - 5 (%)    Eosinophils Absolute 0.1  0.0 - 0.7 (K/uL)    Basophils Relative 1  0 - 1 (%)    Basophils Absolute 0.1  0.0 - 0.1 (K/uL)   LIPID PANEL     Status: Normal   Collection Time   09/06/11  1:30 PM      Component Value Range Comment   Cholesterol 155  0 - 200 (mg/dL)    Triglycerides 413  <150 (mg/dL)    HDL 46  >24 (mg/dL)    Total CHOL/HDL Ratio 3.4      VLDL 21  0 - 40 (mg/dL)    LDL Cholesterol 88  0 - 99 (mg/dL)   MAGNESIUM     Status: Normal   Collection Time   09/06/11  1:30 PM      Component Value Range Comment   Magnesium 1.8  1.5 - 2.5 (mg/dL)   CARDIAC PANEL(CRET KIN+CKTOT+MB+TROPI)     Status: Abnormal   Collection Time   09/06/11  1:30 PM      Component Value Range Comment   Total  CK 82  7 - 232 (U/L)    CK, MB 4.6 (*) 0.3 - 4.0 (ng/mL)    Troponin I <0.30  <0.30 (ng/mL)    Relative Index RELATIVE INDEX IS INVALID  0.0 - 2.5    POCT I-STAT TROPONIN I     Status: Normal   Collection Time   09/06/11  4:25 PM      Component Value Range Comment   Troponin i, poc 0.04  0.00 - 0.08 (ng/mL)    Comment 3             Dg Chest Port 1 View  09/06/2011  *RADIOLOGY REPORT*  Clinical Data: Chest pain.   PORTABLE CHEST - 1 VIEW  Comparison: 01/11/2010.  Findings: Cardiomegaly.  Slightly tortuous aorta.  Mild central pulmonary vascular prominence.  No gross pneumothorax, segmental consolidation or frank pulmonary edema.  The patient would eventually benefit from follow-up two-view chest with cardiac leads removed.  IMPRESSION: Cardiomegaly.  Slightly tortuous aorta.  Mild central pulmonary vascular prominence.  Please see above.  Original Report Authenticated By: Fuller Canada, M.D.   Review of Systems: General: no anorexia, weight gain or weight loss Cardiac: no orthopnea, PND,  or syncope Respiratory: no cough, sputum production or hemoptysis GI: no abdominal pain, emesis, diarrhea or constipation Integument: no significant lesions Neurologic: No muscle weakness or paralysis; no speech disturbance; no headache All other systems reviewed and are negative.  Physical Exam: Blood pressure 103/66, pulse 71, temperature 97.7 F (36.5 C), temperature source Oral, resp. rate 21, height 6' (1.829 m), weight 132.1 kg (291 lb 3.6 oz), SpO2 98.00%. General-Well-developed; no acute distress; obese HEENT-[place]/AT; PERRL; EOM intact; conjunctiva and lids nl Neck-No JVD; no carotid bruits Endocrine-No thyromegaly Lungs-Clear lung fields; resonant percussion; normal I-to-E ratio Cardiovascular- normal PMI; normal S1 and S2 Abdomen-BS normal; soft and non-tender without masses or organomegaly Musculoskeletal-No deformities, cyanosis or clubbing Neurologic-Nl cranial nerves; symmetric strength and tone Skin- Warm, no significant lesions Extremities-Nl distal pulses; no edema  EKG:  Sinus tachycardia at a rate of 106 bpm, left atrial abnormality, delayed R-wave progression, LVH with repolarization abnormalities.  When compared to a previous tracing  10/18/10, there has been no significant interval change.  ST-T wave abnormality has improved in lead II.  Assessment/Plan:  Chest pain:  Patient has been  evaluated for chest discomfort in the past without demonstration of a definite acute coronary syndrome despite the fact that he has known coronary artery disease.  With negative cardiac markers and no change on EKG, I would be inclined to complete 12 hours of monitoring and to treat medically if there is no further evidence for myocardial ischemia.  Hyperlipidemia:  Lipid profile is fairly good, but simvastatin cannot be used in moderate dose with amlodipine.  Atorvastatin will be substituted, which will likely provide better lipid-lowering.  Hypertension: Blood pressure has been well controlled of late.  Current medications will be continued.  History of cocaine use:  Drug screen will be ordered  Troy Bing, MD 09/06/2011, 7:01 PM

## 2011-09-06 NOTE — H&P (Signed)
[id number] 

## 2011-09-06 NOTE — Progress Notes (Signed)
Pt's systolic BP less than 90; turned pt's nitro off at 2000

## 2011-09-06 NOTE — ED Notes (Signed)
Pt reports onset of left sided chest pain since yesterday. States has taken nitro SL without relief. Numbness to left arm. Reports nausea/no vomiting, dizziness, shortness of breath.

## 2011-09-06 NOTE — ED Provider Notes (Signed)
History   This chart was scribed for Peter Horn, MD by Clarita Crane. The patient was seen in room APA07/APA07. Patient's care was started at 1259.    CSN: [id number]  Arrival date & time 09/06/11  1259   First MD Initiated Contact with Patient 09/06/11 1320      Chief Complaint  Patient presents with  . Chest Pain    (Consider location/radiation/quality/duration/timing/severity/associated sxs/prior treatment) HPI Peter Dunlap is a 56 y.o. male who presents to the Emergency Department complaining of left sided chest pain described as pressure radiating to LUE with associated SOB and nausea onset this morning while resting and persistent since. Also notes he experienced an episode of chest pain for several hours yesterday which reoccurred during the night and lasted several hours before resolving. Notes he generally experiences chest discomfort with exertion at baseline but exertional chest pain has become worse the past several weeks and that less activity will bring on episodes of chest pain. Notes episodes of chest pain will last several minutes to 2 hours before resolving generally. Denies vomiting, diarrhea, fever, hematochezia, diaphoresis. Patient with HTN, CAD, MI, SVT with heart ablation, carotid endarterectomy. Patient had his last cardiac catheterization with moderate disease in May 2012 not amenable to PCI.   Cardiologist- [name] PCP- Dondiego  Past Medical History  Diagnosis Date  . Hypertension     Severe LVH with normal EF  . Arteriosclerotic cardiovascular disease (ASCVD) 2005    catheterization in 10/2010:50% mid LAD, diffuse distal disease, circumflex irregularities, large dominant RCA with a 50% ostial, 70% distal, 60% posterolateral and 70% PDA; normal EF  . Cerebrovascular disease 2010    R. carotid endarterectomy; Duplex in 10/2010-widely patent ICAs, subtotal left vertebral-not thought to be contributing to symptoms  . Hyperlipidemia   . Obesity   . Tobacco  abuse   . Benign prostatic hypertrophy   . Low back pain   . History of PSVT (paroxysmal supraventricular tachycardia) 2005    Diagnosed with ILR; no recurrence following RFA  . Cervical spine disease     CT in 2012-advanced degeneration and spondylosis with moderate spinal stenosis at C3-C6  . H/O: substance abuse     Cocaine, marijuana, alcohol  . Syncope   . Gastroesophageal reflux disease   . Depression   . Erectile dysfunction     Past Surgical History  Procedure Date  . Radiofrequency ablation 2005    for PSVT    Family History  Problem Relation Age of Onset  . Hypertension Mother     Cerebrovascular disease  . Coronary artery disease Father     Fatal MI  . Diabetes type II Father     also a sibling  . Hypertension Other     sibling  . Lung cancer Other     History  Substance Use Topics  . Smoking status: Current Everyday Smoker -- 1.0 packs/day for 40 years    Types: Cigarettes  . Smokeless tobacco: Not on file  . Alcohol Use: No      Review of Systems A complete 10 system review of systems was obtained and all systems are negative except as noted in the HPI and PMH.   Allergies  Review of patient's allergies indicates no known allergies.  Home Medications   No current outpatient prescriptions on file.  BP 92/57  Pulse 71  Temp(Src) 98.3 F (36.8 C) (Oral)  Resp 3  Ht 6' (1.829 m)  Wt 291 lb 3.6 oz (132.1 kg)  BMI 39.50 kg/m2  SpO2 100%  Physical Exam  Nursing note and vitals reviewed. Constitutional: He appears well-developed and well-nourished. No distress.       Awake, alert, nontoxic appearance.  HENT:  Head: Normocephalic and atraumatic.  Mouth/Throat: Oropharynx is clear and moist.  Eyes: Right eye exhibits no discharge. Left eye exhibits no discharge.  Neck: Neck supple.  Cardiovascular: Normal rate and regular rhythm.   No murmur heard. Pulmonary/Chest: Effort normal. No respiratory distress. He has no wheezes. He has no rales.  He exhibits no tenderness.  Abdominal: Soft. Bowel sounds are normal. There is no tenderness. There is no rebound.  Musculoskeletal: He exhibits no edema and no tenderness.       Baseline ROM, no obvious new focal weakness. Diffuse back non tender.   Neurological: He is alert.       Mental status and motor strength appears baseline for patient and situation.  Skin: Skin is warm and dry. No rash noted.  Psychiatric: He has a normal mood and affect.    ED Course  Procedures (including critical care time) ECG: Sinus tachycardia, ventricular rate 106, normal axis, left ventricular hypertrophy with repolarization abnormality with ST depression and inverted T waves laterally, no comparison ECG available  CRITICAL CARE Performed by: Peter Dunlap   Total critical care time:  Critical care time was exclusive of separately billable procedures and treating other patients.  Critical care was necessary to treat or prevent imminent or life-threatening deterioration.  Critical care was time spent personally by me on the following activities: development of treatment plan with patient and/or surrogate as well as nursing, discussions with consultants, evaluation of patient's response to treatment, examination of patient, obtaining history from patient or surrogate, ordering and performing treatments and interventions, ordering and review of laboratory studies, ordering and review of radiographic studies, pulse oximetry and re-evaluation of patient's condition. DIAGNOSTIC STUDIES: Oxygen Saturation is 99% on room air, normal by my interpretation.    COORDINATION OF CARE: 1:33PM- Patient informed of current plan for treatment and evaluation and agrees with plan at this time.   CP almost gone [facility] paged.[phone] D/w [facility] then [facility]; PCP will see Pt in ED for admit.[phone]   Labs Reviewed  BASIC METABOLIC PANEL - Abnormal; Notable for the following:    Glucose, Bld 101 (*)    GFR calc non Af  Amer 71 (*)    GFR calc Af Amer 83 (*)    All other components within normal limits  CARDIAC PANEL(CRET KIN+CKTOT+MB+TROPI) - Abnormal; Notable for the following:    CK, MB 4.6 (*)    All other components within normal limits  CARDIAC PANEL(CRET KIN+CKTOT+MB+TROPI) - Abnormal; Notable for the following:    CK, MB 4.4 (*)    All other components within normal limits  HEPARIN LEVEL (UNFRACTIONATED) - Abnormal; Notable for the following:    Heparin Unfractionated 0.24 (*)    All other components within normal limits  CBC  DIFFERENTIAL  LIPID PANEL  MAGNESIUM  POCT I-STAT TROPONIN I  POCT I-STAT TROPONIN I  MRSA PCR SCREENING  TSH  CARDIAC PANEL(CRET KIN+CKTOT+MB+TROPI)  BASIC METABOLIC PANEL  DRUGS OF ABUSE SCREEN W ALC, ROUTINE URINE  CARDIAC PANEL(CRET KIN+CKTOT+MB+TROPI)  Trop 0.04 normal drawn at 1320 Dg Chest Port 1 View  09/06/2011  *RADIOLOGY REPORT*  Clinical Data: Chest pain.  PORTABLE CHEST - 1 VIEW  Comparison: 01/11/2010.  Findings: Cardiomegaly.  Slightly tortuous aorta.  Mild central pulmonary vascular prominence.  No gross pneumothorax,  segmental consolidation or frank pulmonary edema.  The patient would eventually benefit from follow-up two-view chest with cardiac leads removed.  IMPRESSION: Cardiomegaly.  Slightly tortuous aorta.  Mild central pulmonary vascular prominence.  Please see above.  Original Report Authenticated By: Fuller Canada, M.D.     1. Unstable angina   2. Coronary atherosclerosis of unspecified type of vessel, native or graft   3. Mixed hyperlipidemia   4. Unspecified essential hypertension       MDM  Pt feels improved after observation and/or treatment in ED.Patient / Family / Caregiver informed of clinical course, understand medical decision-making process, and agree with plan.The patient appears reasonably stabilized for admission considering the current resources, flow, and capabilities available in the ED at this time, and I doubt any  other [facility] requiring further screening and/or treatment in the ED prior to admission.      I personally performed the services described in this documentation, which was scribed in my presence. The recorded information has been reviewed and considered.    Peter Horn, MD 09/06/11 [phone]

## 2011-09-06 NOTE — H&P (Signed)
NAME:  Peter Dunlap, Peter Dunlap NO.:  [id number]  MEDICAL RECORD NO.:  [id number]  LOCATION:  IC03                          FACILITY:  [facility]  PHYSICIAN:  Melvyn Novas, MDDATE OF BIRTH:  [birth year]  DATE OF ADMISSION:  09/06/2011 DATE OF DISCHARGE:  LH                             HISTORY & PHYSICAL   The patient is a 56 year old black male with known coronary artery disease, had a cath a year ago according to the patient, states he had a 70% lesion and multiple other lesions, less severe recommended medical therapy.  Likewise, he had radiofrequency ablation, presumably due to accessory bypass tract 5 years ago.  He complains of a 3-4 day history of for crescendo angina pectoris, crushing substernal chest pain associated with nausea and dyspnea, which has occurred upon less amounts of walking in the last 3-4 days progressive in nature.  This is relieved by rest.  He presented to the hospital.  Cardiac enzymes are pending at this time, and he is placed on IV nitro with complete relief.  He denies any associated deep palpitations, dizziness, or syncope.  He likewise was noted to have UTI and hematuria in the ER with known BPH, will be placed on Rocephin and IV heparin and IV nitroglycerin.  PAST MEDICAL HISTORY:  Significant for coronary artery disease, peripheral arterial disease, hypertension, hyperlipidemia, COPD, benign prostatic hypertrophy.  PAST SURGICAL HISTORY:  Remarkable for radiofrequency ablation of bypass tract and carotid endarterectomy.  He has no known allergies.  CURRENT MEDICINES: 1. Norvasc 5 mg p.o. daily. 2. Benazepril 20 mg p.o. daily. 3. Clonidine 0.1 mg p.o. b.i.d. 4. Plavix 75 mg p.o. daily. 5. Flomax 0.4 mg p.o. daily. 6. Sublingual nitroglycerin p.r.n. 0.4 mg. 7. Prilosec 20 mg p.o. daily. 8. Simvastatin 40 mg p.o. daily.  REVIEW OF SYSTEMS:  Negative for seizures, tremors, polyuria, polydipsia, melena, hematemesis,  hematochezia.  PHYSICAL EXAMINATION:  VITAL SIGNS:  Blood pressure 145/64, weight is 265, O2 sat is 99%, pulse is 90 and regular. EYES:  PERRLA.  Extraocular movements intact.  Sclerae clear. Conjunctivae pink. NECK:  No JVD.  No carotid bruits.  No thyromegaly.  No thyroid bruits. LUNGS:  Diminished breath sounds at bases.  No rales, wheezes, or rhonchi appreciable. HEART:  Regular rhythm.  No S3, S4 gallops.  No heaves, thrills, or rubs. ABDOMEN:  Obese, soft, nontender.  Bowel sounds are normoactive.  No guarding, rebound, mass, or megaly. EXTREMITIES:  No clubbing, cyanosis, or edema.  The plan right now is to: 1. Admit to ICU with crescendo angina pectoris. 2. Known coronary artery disease. 3. Status post radiofrequency ablation of accessory bypass tract years     ago. 4. Hypertension. 5. Hyperlipidemia. 6. Continued smoking half pack per day.  The patient placed on IV     heparin per ER doctor, will continue the same, IV nitroglycerin.     Serial cardiac enzymes are pending.  Continue home medicines and     Cardiology consultation for probable presumed catheterization if     they deem clinically necessary.     Melvyn Novas, MD     RMD/MEDQ  D:  09/06/2011  T:  09/06/2011  Job:  [id number] 

## 2011-09-07 ENCOUNTER — Other Ambulatory Visit: Payer: Self-pay

## 2011-09-07 DIAGNOSIS — Z720 Tobacco use: Secondary | ICD-10-CM | POA: Diagnosis present

## 2011-09-07 DIAGNOSIS — E785 Hyperlipidemia, unspecified: Secondary | ICD-10-CM | POA: Diagnosis present

## 2011-09-07 DIAGNOSIS — I110 Hypertensive heart disease with heart failure: Secondary | ICD-10-CM | POA: Diagnosis present

## 2011-09-07 DIAGNOSIS — I422 Other hypertrophic cardiomyopathy: Secondary | ICD-10-CM | POA: Diagnosis present

## 2011-09-07 DIAGNOSIS — I679 Cerebrovascular disease, unspecified: Secondary | ICD-10-CM | POA: Insufficient documentation

## 2011-09-07 DIAGNOSIS — E782 Mixed hyperlipidemia: Secondary | ICD-10-CM | POA: Diagnosis present

## 2011-09-07 DIAGNOSIS — I2510 Atherosclerotic heart disease of native coronary artery without angina pectoris: Secondary | ICD-10-CM | POA: Diagnosis present

## 2011-09-07 DIAGNOSIS — I319 Disease of pericardium, unspecified: Secondary | ICD-10-CM

## 2011-09-07 LAB — TSH: TSH: 1.364 u[IU]/mL (ref 0.350–4.500)

## 2011-09-07 LAB — BASIC METABOLIC PANEL
BUN: 12 mg/dL (ref 6–23)
CO2: 25 mEq/L (ref 19–32)
Calcium: 8.5 mg/dL (ref 8.4–10.5)
Chloride: 102 mEq/L (ref 96–112)
Creatinine, Ser: 1.27 mg/dL (ref 0.50–1.35)
GFR calc Af Amer: 72 mL/min — ABNORMAL LOW (ref >90)
GFR calc non Af Amer: 62 mL/min — ABNORMAL LOW (ref >90)
Glucose, Bld: 89 mg/dL (ref 70–99)
Potassium: 3.4 mEq/L — ABNORMAL LOW (ref 3.5–5.1)
Sodium: 136 mEq/L (ref 135–145)

## 2011-09-07 LAB — CARDIAC PANEL(CRET KIN+CKTOT+MB+TROPI)
CK, MB: 3.8 ng/mL (ref 0.3–4.0)
CK, MB: 4.1 ng/mL — ABNORMAL HIGH (ref 0.3–4.0)
Relative Index: INVALID (ref 0.0–2.5)
Relative Index: INVALID (ref 0.0–2.5)
Total CK: 64 U/L (ref 7–232)
Total CK: 66 U/L (ref 7–232)
Troponin I: <0.3 ng/mL (ref <0.30)
Troponin I: <0.3 ng/mL (ref <0.30)

## 2011-09-07 LAB — HEPARIN LEVEL (UNFRACTIONATED): Heparin Unfractionated: 0.11 IU/mL — ABNORMAL LOW (ref 0.30–0.70)

## 2011-09-07 MED ORDER — ATORVASTATIN CALCIUM 40 MG PO TABS
80.0000 mg | ORAL_TABLET | Freq: Every day | ORAL | Status: DC
  Administered 2011-09-07: 80 mg via ORAL
  Filled 2011-09-07: qty 2

## 2011-09-07 MED ORDER — POTASSIUM CHLORIDE CRYS ER 20 MEQ PO TBCR
40.0000 meq | EXTENDED_RELEASE_TABLET | Freq: Every day | ORAL | Status: AC
  Administered 2011-09-07 – 2011-09-08 (×2): 40 meq via ORAL
  Filled 2011-09-07: qty 1
  Filled 2011-09-07: qty 2

## 2011-09-07 MED ORDER — HEPARIN (PORCINE) IN NACL 100-0.45 UNIT/ML-% IJ SOLN
15.0000 [IU]/kg/h | INTRAMUSCULAR | Status: DC
  Administered 2011-09-07: 10 [IU]/kg/h via INTRAVENOUS
  Administered 2011-09-07: 15 [IU]/kg/h via INTRAVENOUS
  Filled 2011-09-07: qty 250

## 2011-09-07 MED ORDER — ENOXAPARIN SODIUM 40 MG/0.4ML ~~LOC~~ SOLN
40.0000 mg | SUBCUTANEOUS | Status: DC
  Administered 2011-09-07: 40 mg via SUBCUTANEOUS
  Filled 2011-09-07: qty 0.4

## 2011-09-07 NOTE — Progress Notes (Signed)
NAME:  Peter Dunlap, Peter Dunlap NO.:  [id number]  MEDICAL RECORD NO.:  [id number]  LOCATION:  IC03                          FACILITY:  [facility]  PHYSICIAN:  Melvyn Novas, MDDATE OF BIRTH:  11-18-1955  DATE OF PROCEDURE:  09/07/2011 DATE OF DISCHARGE:                                PROGRESS NOTE   PROBLEMS: 1. Crescendo angina pectoris for 4-5 days, precipitated by exertion     and relieved by rest. 2. Hypertension. 3. Hyperlipidemia. 4. Known coronary artery disease with a 70% lesion in one artery and     lesser lesions in other arteries.  I do not know exact anatomy at     this point. 5. Recurrent hematuria and urinary tract infection. 6. Benign prostatic hypertrophy. 7. Previous ablation of accessory bypass tract, 5years ago.  SUBJECTIVE:  The patient spent the night quiet and had minimal episode of chest heaviness last night.  IV nitro was weaned due to hypertension. Cardiac enzymes were negative x2, troponins.  Currently on IV heparin. Fraction is 0.24, little suboptimal this morning at 10 kg/hour.  The patient has hypokalemia of 3.4.  Creatinine is 1.27.  OBJECTIVE:  NECK:  No JVD. LUNGS:  Clear to A and P.  No rales, wheeze, or rhonchi. HEART:  Regular rhythm.  No S3, S4.  ASSESSMENT AND PLAN:  2D echo is pending.  Cardiology consultation obtained.  The plan right now is to continue current therapy, Norvasc, Lopressor 25 b.i.d., Zocor 40, aspirin, heparin and IV nitro if hemodynamics permit.  Cardiology consultation will deem what sort of diagnostic evaluation will be warranted depending on the patient's clinical course.     Melvyn Novas, MD     RMD/MEDQ  D:  09/07/2011  T:  09/07/2011  Job:  [id number]

## 2011-09-07 NOTE — Progress Notes (Signed)
Utilization review completed.  

## 2011-09-07 NOTE — Progress Notes (Signed)
[id number] 

## 2011-09-07 NOTE — Progress Notes (Signed)
*  PRELIMINARY RESULTS* Echocardiogram 2D Echocardiogram has been performed.  Conrad  09/07/2011, 9:30 AM

## 2011-09-07 NOTE — Consult Note (Signed)
ANTICOAGULATION CONSULT NOTE - Initial Consult  Pharmacy Consult for IV Heparin Indication: chest pain/ACS  No Known Allergies  Patient Measurements: Height: 6' (182.9 cm) Weight: 294 lb 5 oz (133.5 kg) IBW/kg (Calculated) : 77.6  Heparin Dosing Weight: 95kg  Vital Signs: Temp: 98.3 F (36.8 C) (04/04 1200) Temp src: Oral (04/04 1200) BP: 109/70 mmHg (04/04 0600) Pulse Rate: 68  (04/04 0600)  Labs:  Basename 09/07/11 0811 09/07/11 0456 09/06/11 2041 09/06/11 1330  HGB -- -- -- 16.7  HCT -- -- -- 47.5  PLT -- -- -- 176  APTT -- -- -- --  LABPROT -- -- -- --  INR -- -- -- --  HEPARINUNFRC 0.11* -- 0.24* --  CREATININE -- 1.27 -- 1.13  CKTOTAL 66 64 67 --  CKMB 4.1* 3.8 4.4* --  TROPONINI <0.30 <0.30 <0.30 --   Estimated Creatinine Clearance: 93 ml/min (by C-G formula based on Cr of 1.27).  Medical History: Past Medical History  Diagnosis Date  . Hypertension     Severe LVH with normal EF  . Arteriosclerotic cardiovascular disease (ASCVD) 2005    catheterization in 10/2010:50% mid LAD, diffuse distal disease, circumflex irregularities, large dominant RCA with a 50% ostial, 70% distal, 60% posterolateral and 70% PDA; normal EF  . Cerebrovascular disease 2010    R. carotid endarterectomy; Duplex in 10/2010-widely patent ICAs, subtotal left vertebral-not thought to be contributing to symptoms  . Hyperlipidemia   . Obesity   . Tobacco abuse   . Benign prostatic hypertrophy   . Low back pain   . History of PSVT (paroxysmal supraventricular tachycardia) 2005    Diagnosed with ILR; no recurrence following RFA  . Cervical spine disease     CT in 2012-advanced degeneration and spondylosis with moderate spinal stenosis at C3-C6  . H/O: substance abuse     Cocaine, marijuana, alcohol  . Syncope   . Gastroesophageal reflux disease   . Depression   . Erectile dysfunction    Medications:  Scheduled:    . amLODipine  5 mg Oral Daily  . aspirin  81 mg Oral Daily  .  aspirin  325 mg Oral Once  . atorvastatin  40 mg Oral q1800  . benazepril  20 mg Oral Daily  . cefTRIAXone (ROCEPHIN)  IV  1 g Intravenous Q24H  . cloNIDine  0.1 mg Oral TID  . fentaNYL  100 mcg Intravenous Once  . heparin      . heparin  4,000 Units Intravenous Once  . metoprolol tartrate  25 mg Oral BID  . potassium chloride  40 mEq Oral Daily  . sodium chloride  500 mL Intravenous Once  . Tamsulosin HCl  0.4 mg Oral Daily  . DISCONTD: simvastatin  40 mg Oral q1800   Assessment: Heparin initiated in ED with 4000 unit bolus and infusion Some hematuria noted per MD progress note Heparin level below goal and falling CBC OK  Goal of Therapy:  Heparin level 0.3-0.7 units/ml   Plan: Increase Heparin to 15 units/kg/hr F/U heparin level and CBC Monitor for any signs of bleeding, hematuria Heparin level daily until iv heparin d/c'd  Valrie Hart A 09/07/2011,12:42 PM

## 2011-09-07 NOTE — Progress Notes (Signed)
Peter Dunlap  56 y.o.  male  Subjective: No chest pain nor dyspnea today. Patient has not yet been out of bed. Multiple first degree relatives have a history of cardiac problems, but no one with sudden death or cardiomyopathy although several have a history of syncope.  Allergy: Review of patient's allergies indicates no known allergies.  Objective: Vital signs in last 24 hours: Temp:  [97.7 F (36.5 C)-98.3 F (36.8 C)] 98.3 F (36.8 C) (04/04 1200) Pulse Rate:  [61-80] 68  (04/04 0600) Resp:  [3-22] 11  (04/04 0600) BP: (67-148)/(32-78) 109/70 mmHg (04/04 0600) SpO2:  [96 %-100 %] 99 % (04/04 0600) Weight:  [132.1 kg (291 lb 3.6 oz)-133.5 kg (294 lb 5 oz)] 133.5 kg (294 lb 5 oz) (04/04 0500)  133.5 kg (294 lb 5 oz) Body mass index is 39.92 kg/(m^2).  Weight change:  Last BM Date: 09/04/11  Intake/Output from previous day: 04/03 0701 - 04/04 0700 In: 2355.4 [P.O.:360; I.V.:1995.4] Out: -   Total: +2.4 L Weight: Increased 13 kg since admission, certainly representing an error; increased 1.4 kg since yesterday.  General- Well developed; no acute distress  Neck- No JVD, no carotid bruits; overweight Lungs- clear lung fields; normal I:E ratio Cardiovascular- normal PMI; normal S1 and S2; grade 2/6 basilar systolic ejection murmur Abdomen- normal bowel sounds; soft and non-tender without masses or organomegaly Skin- Warm, no significant lesions Extremities- Nl distal pulses; trace edema  Lab Results: Cardiac Markers:   Basename 09/07/11 0811 09/07/11 0456  TROPONINI <0.30 <0.30   CBC:   Basename 09/06/11 1330  WBC 7.7  HGB 16.7  HCT 47.5  PLT 176   BMET:  Basename 09/07/11 0456 09/06/11 1330  NA 136 137  K 3.4* 3.6  CL 102 101  CO2 25 24  GLUCOSE 89 101*  BUN 12 9  CREATININE 1.27 1.13  CALCIUM 8.5 9.4   Lipids:   Basename 09/06/11 1330  CHOL 155  TRIG 104  HDL 46    EKG:  Normal sinus rhythm; LVH with repolarization abnormality; left atrial  abnormality; unchanged.  Imaging Studies/Results: Dg Chest Port 1 View  09/06/2011  *RADIOLOGY REPORT*  Clinical Data: Chest pain.  PORTABLE CHEST - 1 VIEW  Comparison: 01/11/2010.  Findings: Cardiomegaly.  Slightly tortuous aorta.  Mild central pulmonary vascular prominence.  No gross pneumothorax, segmental consolidation or frank pulmonary edema.  The patient would eventually benefit from follow-up two-view chest with cardiac leads removed.  IMPRESSION: Cardiomegaly.  Slightly tortuous aorta.  Mild central pulmonary vascular prominence.  Please see above.  Original Report Authenticated By: Fuller Canada, M.D.   Imaging: Imaging results have been reviewed  Medications: I have reviewed the patient's current medications.  Infusions:     . sodium chloride 125 mL/hr at 09/07/11 0900  . heparin 10 Units/kg/hr (09/07/11 1054)  . nitroGLYCERIN Stopped (09/06/11 2000)  . DISCONTD: heparin 10 Units/kg/hr (09/06/11 1405)     Assessment/Plan: Hypertension:  Patient has been hypotensive today but asymptomatic. Intravenous nitroglycerin discontinued.  Clonidine will also be held.  Arteriosclerotic cardiovascular disease (ASCVD): Cardiac markers have been negative, and myocardial infarction has been ruled out. Intravenous heparin will also be discontinued. No further testing for coronary disease is anticipated.   Hyperlipidemia:  Total cholesterol-155, TG-104, HDL-46, LDL-88.  Lipid study is slightly suboptimal. Atorvastatin will be increased to 80 mg per day.  Hypertrophic cardiomyopathy: Echocardiogram shows very prominent LVH. It is unclear whether there has been adequate hypertension in the past to account  for this marked thickening of the myocardium. The possibility of a primary hypertrophic process should be entertained.   If patient has no further symptoms and does well with ambulation, consideration can be given to discharge on 4/5.   LOS: 1 day   Braswell Bing 09/07/2011, 3:56  PM

## 2011-09-07 NOTE — Progress Notes (Signed)
eLink Physician-Brief Progress Note Patient Name: Peter Dunlap DOB: 12/18/1955 MRN: [id number]  Date of Service  09/07/2011   HPI/Events of Note  [place]/MI patient. Nitro gtt turned off 4-5 h ago due to hypotension. Currently MAP 54 with sbp 78 but patient asymptomatic and sitting on bedside commode and oriented and looks well on camera   eICU Interventions  Call MD if MAP < 50 or sbp <70 or clinial symptoms of hypotension   Intervention Category Major Interventions: Hypotension - evaluation and management  Nikitta Sobiech 09/07/2011, 12:54 AM

## 2011-09-08 LAB — CBC
HCT: 42.6 % (ref 39.0–52.0)
Hemoglobin: 14.5 g/dL (ref 13.0–17.0)
MCH: 31.6 pg (ref 26.0–34.0)
MCHC: 34 g/dL (ref 30.0–36.0)
MCV: 92.8 fL (ref 78.0–100.0)
Platelets: 161 10*3/uL (ref 150–400)
RBC: 4.59 MIL/uL (ref 4.22–5.81)
RDW: 13.1 % (ref 11.5–15.5)
WBC: 8 10*3/uL (ref 4.0–10.5)

## 2011-09-08 LAB — BASIC METABOLIC PANEL
BUN: 11 mg/dL (ref 6–23)
CO2: 24 mEq/L (ref 19–32)
Calcium: 8.7 mg/dL (ref 8.4–10.5)
Chloride: 105 mEq/L (ref 96–112)
Creatinine, Ser: 1.13 mg/dL (ref 0.50–1.35)
GFR calc Af Amer: 83 mL/min — ABNORMAL LOW (ref >90)
GFR calc non Af Amer: 71 mL/min — ABNORMAL LOW (ref >90)
Glucose, Bld: 83 mg/dL (ref 70–99)
Potassium: 3.7 mEq/L (ref 3.5–5.1)
Sodium: 137 mEq/L (ref 135–145)

## 2011-09-08 MED ORDER — AMLODIPINE BESYLATE 5 MG PO TABS: 5.0000 mg | ORAL_TABLET | Freq: Every day | ORAL | Status: DC

## 2011-09-08 MED ORDER — BENAZEPRIL HCL 20 MG PO TABS: 20.0000 mg | ORAL_TABLET | Freq: Every day | ORAL | Status: DC

## 2011-09-08 MED ORDER — HYDROCODONE-ACETAMINOPHEN 5-325 MG PO TABS
1.0000 | ORAL_TABLET | ORAL | Status: DC | PRN
  Administered 2011-09-08 (×2): 1 via ORAL
  Filled 2011-09-08 (×2): qty 1

## 2011-09-08 NOTE — Progress Notes (Signed)
Subjective: He says he feels well. Dr. Marvel Plan note is seen and appreciated. He says he got up a little bit yesterday but not a great deal. He has not had anymore chest pain. He feels okay.  Objective: Vital signs in last 24 hours: Temp:  [97.9 F (36.6 C)-99.2 F (37.3 C)] 98.8 F (37.1 C) (04/05 0400) Pulse Rate:  [60-93] 64  (04/05 0600) Resp:  [5-27] 18  (04/05 0600) BP: (74-131)/(39-71) 131/68 mmHg (04/05 0600) SpO2:  [91 %-100 %] 98 % (04/05 0600) Weight:  [128.7 kg (283 lb 11.7 oz)] 128.7 kg (283 lb 11.7 oz) (04/05 0500) Weight change: 8.497 kg (18 lb 11.7 oz) Last BM Date: 09/07/11  Intake/Output from previous day: 04/04 0701 - 04/05 0700 In: 240 [P.O.:120; IV Piggyback:100] Out: 2175 [Urine:2175]  PHYSICAL EXAM General appearance: alert, cooperative and no distress Resp: clear to auscultation bilaterally Cardio: regular rate and rhythm, S1, S2 normal, no murmur, click, rub or gallop GI: soft, non-tender; bowel sounds normal; no masses,  no organomegaly Extremities: extremities normal, atraumatic, no cyanosis or edema  Lab Results:    Basic Metabolic Panel:  Basename 09/08/11 0430 09/07/11 0456 09/06/11 1330  NA 137 136 --  K 3.7 3.4* --  CL 105 102 --  CO2 24 25 --  GLUCOSE 83 89 --  BUN 11 12 --  CREATININE 1.13 1.27 --  CALCIUM 8.7 8.5 --  MG -- -- 1.8  PHOS -- -- --   Liver Function Tests: No results found for this basename: AST:2,ALT:2,ALKPHOS:2,BILITOT:2,PROT:2,ALBUMIN:2 in the last 72 hours No results found for this basename: LIPASE:2,AMYLASE:2 in the last 72 hours No results found for this basename: AMMONIA:2 in the last 72 hours CBC:  Basename 09/08/11 0430 09/06/11 1330  WBC 8.0 7.7  NEUTROABS -- 3.9  HGB 14.5 16.7  HCT 42.6 47.5  MCV 92.8 91.0  PLT 161 176   Cardiac Enzymes:  Basename 09/07/11 0811 09/07/11 0456 09/06/11 2041  CKTOTAL 66 64 67  CKMB 4.1* 3.8 4.4*  CKMBINDEX -- -- --  TROPONINI <0.30 <0.30 <0.30   BNP: No  results found for this basename: PROBNP:3 in the last 72 hours D-Dimer: No results found for this basename: DDIMER:2 in the last 72 hours CBG: No results found for this basename: GLUCAP:6 in the last 72 hours Hemoglobin A1C: No results found for this basename: HGBA1C in the last 72 hours Fasting Lipid Panel:  Basename 09/06/11 1330  CHOL 155  HDL 46  LDLCALC 88  TRIG 104  CHOLHDL 3.4  LDLDIRECT --   Thyroid Function Tests:  Basename 09/07/11 0456  TSH 1.364  T4TOTAL --  FREET4 --  T3FREE --  THYROIDAB --   Anemia Panel: No results found for this basename: VITAMINB12,FOLATE,FERRITIN,TIBC,IRON,RETICCTPCT in the last 72 hours Coagulation: No results found for this basename: LABPROT:2,INR:2 in the last 72 hours Urine Drug Screen: Drugs of Abuse     Component Value Date/Time   LABOPIA NONE DETECTED 10/18/2010 1300   COCAINSCRNUR NONE DETECTED 10/18/2010 1300   COCAINSCRNUR POS* 06/03/2008 2012   LABBENZ NONE DETECTED 10/18/2010 1300   LABBENZ NEG 06/03/2008 2012   AMPHETMU NONE DETECTED 10/18/2010 1300   AMPHETMU NEG 06/03/2008 2012   THCU NONE DETECTED 10/18/2010 1300   LABBARB  Value: NONE DETECTED        DRUG SCREEN FOR MEDICAL PURPOSES ONLY.  IF CONFIRMATION IS NEEDED FOR ANY PURPOSE, NOTIFY LAB WITHIN 5 DAYS.        LOWEST DETECTABLE LIMITS FOR URINE  DRUG SCREEN Drug Class       Cutoff (ng/mL) Amphetamine      1000 Barbiturate      200 Benzodiazepine   200 Tricyclics       300 Opiates          300 Cocaine          300 THC              50 10/18/2010 1300    Alcohol Level: No results found for this basename: ETH:2 in the last 72 hours Urinalysis: No results found for this basename: COLORURINE:2,APPERANCEUR:2,LABSPEC:2,PHURINE:2,GLUCOSEU:2,HGBUR:2,BILIRUBINUR:2,KETONESUR:2,PROTEINUR:2,UROBILINOGEN:2,NITRITE:2,LEUKOCYTESUR:2 in the last 72 hours Misc. Labs:  ABGS No results found for this basename: PHART,PCO2,PO2ART,TCO2,HCO3 in the last 72 hours CULTURES Recent Results  (from the past 240 hour(s))  MRSA PCR SCREENING     Status: Normal   Collection Time   09/06/11  7:10 PM      Component Value Range Status Comment   MRSA by PCR NEGATIVE  NEGATIVE  Final    Studies/Results: Dg Chest Port 1 View  09/06/2011  *RADIOLOGY REPORT*  Clinical Data: Chest pain.  PORTABLE CHEST - 1 VIEW  Comparison: 01/11/2010.  Findings: Cardiomegaly.  Slightly tortuous aorta.  Mild central pulmonary vascular prominence.  No gross pneumothorax, segmental consolidation or frank pulmonary edema.  The patient would eventually benefit from follow-up two-view chest with cardiac leads removed.  IMPRESSION: Cardiomegaly.  Slightly tortuous aorta.  Mild central pulmonary vascular prominence.  Please see above.  Original Report Authenticated By: Fuller Canada, M.D.    Medications:  Prior to Admission:  Prescriptions prior to admission  Medication Sig Dispense Refill  . amLODipine (NORVASC) 2.5 MG tablet Take 2.5 mg by mouth daily.      [name] aspirin 81 MG tablet Take 81 mg by mouth daily.        . cloNIDine (CATAPRES) 0.1 MG tablet Take 0.1 mg by mouth 2 (two) times daily.        . clopidogrel (PLAVIX) 75 MG tablet Take 75 mg by mouth daily.        [name] dexlansoprazole (DEXILANT) 60 MG capsule Take 60 mg by mouth daily before breakfast. Take one capsule by mouth 15 to 20 minutes prior to breakfast      . Dutasteride-Tamsulosin HCl 0.5-0.4 MG CAPS Take 1 tablet by mouth at bedtime.       . hydrochlorothiazide (MICROZIDE) 12.5 MG capsule Take 12.5 mg by mouth daily.      [name] NITROSTAT 0.4 MG SL tablet Dissolve one (1) tablet under the tongue at onset of chest pain--may repeat every . for upto 3 doses!  25 each  8  . omeprazole (PRILOSEC) 20 MG capsule Take 20 mg by mouth daily.        . simvastatin (ZOCOR) 40 MG tablet Take 40 mg by mouth daily.        Scheduled:   . amLODipine  5 mg Oral Daily  . aspirin  81 mg Oral Daily  . atorvastatin  80 mg Oral q1800  . benazepril  20 mg Oral Daily  .  cefTRIAXone (ROCEPHIN)  IV  1 g Intravenous Q24H  . enoxaparin (LOVENOX) injection  40 mg Subcutaneous Q24H  . metoprolol tartrate  25 mg Oral BID  . potassium chloride  40 mEq Oral Daily  . Tamsulosin HCl  0.4 mg Oral Daily  . DISCONTD: atorvastatin  40 mg Oral q1800  . DISCONTD: cloNIDine  0.1 mg Oral TID   Continuous:   .  DISCONTD: sodium chloride 125 mL/hr at 09/07/11 0900  . DISCONTD: heparin 10 Units/kg/hr (09/06/11 1405)  . DISCONTD: heparin 15 Units/kg/hr (09/07/11 1240)  . DISCONTD: nitroGLYCERIN Stopped (09/06/11 2000)   YQM:[id number], nitroGLYCERIN  Assesment: He has had chest discomfort. He has what may be a hypertrophic cardiomyopathy. He has not had any new symptoms. He will start ambulating and if he does okay with that it may be a BE discharged home Active Problems:  Hypertension  Arteriosclerotic cardiovascular disease (ASCVD)  Hyperlipidemia  Tobacco abuse  Hypertrophic cardiomyopathy    Plan: He will ambulate this morning and see if I can get him home after that    LOS: 2 days   Jerimiah Wolman L 09/08/2011, 7:37 AM

## 2011-09-08 NOTE — Progress Notes (Signed)
PT'S DISCHARGE INSTRUCTIONS AND REVIEWED. DENIES ANY SOB OR CHEST PAIN OR DISCOMFORT. O2 SAT 97% ON ROOM AIR. IV'S D/C'D. DISCHARGED HOME VIA W/C ACCOMPAINED  BY WIFE.

## 2011-09-08 NOTE — Progress Notes (Signed)
   CARE MANAGEMENT NOTE 09/08/2011  Patient:  Peter Dunlap, Peter Dunlap   Account Number:  [id number]  Date Initiated:  09/08/2011  Documentation initiated by:  Sharrie Rothman  Subjective/Objective Assessment:   Pt admitted with CP. Pt lives with wife.  Pt does have an aide with [facility] that comes out several times a week to see pt.     Action/Plan:   Pt to return home. No HH or CM needs noted.   Anticipated DC Date:  09/13/2011   Anticipated DC Plan:  HOME/SELF CARE      DC Planning Services  CM consult      Choice offered to / List presented to:             Status of service:  Completed, signed off Medicare Important Message given?   (If response is "NO", the following Medicare IM given date fields will be blank) Date Medicare IM given:   Date Additional Medicare IM given:    Discharge Disposition:  HOME/SELF CARE  Per UR Regulation:    If discussed at Long Length of Stay Meetings, dates discussed:    Comments:  09/08/11 1447 Arlyss Queen, RN BSN CM Pt has no CM needs noted at this time.

## 2011-09-09 NOTE — Discharge Summary (Signed)
Physician Discharge Summary  Patient ID: TARRIN LEBOW MRN: [id number] DOB/AGE: 09-Apr-1956 56 y.o. Primary Care Physician:DONDIEGO,RICHARD M, MD, MD Admit date: 09/06/2011 Discharge date: 09/09/2011    Discharge Diagnoses:   Active Problems:  Hypertension  Arteriosclerotic cardiovascular disease (ASCVD)  Hyperlipidemia  Tobacco abuse  Hypertrophic cardiomyopathy   Medication List  As of 09/09/2011 10:37 AM   TAKE these medications         amLODipine 5 MG tablet   Commonly known as: NORVASC   Take 1 tablet (5 mg total) by mouth daily.      aspirin 81 MG tablet   Take 81 mg by mouth daily.      benazepril 20 MG tablet   Commonly known as: LOTENSIN   Take 1 tablet (20 mg total) by mouth daily.      cloNIDine 0.1 MG tablet   Commonly known as: CATAPRES   Take 0.1 mg by mouth 2 (two) times daily.      clopidogrel 75 MG tablet   Commonly known as: PLAVIX   Take 75 mg by mouth daily.      DEXILANT 60 MG capsule   Generic drug: dexlansoprazole   Take 60 mg by mouth daily before breakfast. Take one capsule by mouth 15 to 20 minutes prior to breakfast      Dutasteride-Tamsulosin HCl 0.5-0.4 MG Caps   Take 1 tablet by mouth at bedtime.      hydrochlorothiazide 12.5 MG capsule   Commonly known as: MICROZIDE   Take 12.5 mg by mouth daily.      NITROSTAT 0.4 MG SL tablet   Generic drug: nitroGLYCERIN   Dissolve one (1) tablet under the tongue at onset of chest pain--may repeat every . for upto 3 doses!      omeprazole 20 MG capsule   Commonly known as: PRILOSEC   Take 20 mg by mouth daily.      simvastatin 40 MG tablet   Commonly known as: ZOCOR   Take 40 mg by mouth daily.            Discharged Condition: Improved    Consults: Cardiology  Significant Diagnostic Studies: Dg Chest Port 1 View  09/06/2011  *RADIOLOGY REPORT*  Clinical Data: Chest pain.  PORTABLE CHEST - 1 VIEW  Comparison: 01/11/2010.  Findings: Cardiomegaly.  Slightly tortuous aorta.   Mild central pulmonary vascular prominence.  No gross pneumothorax, segmental consolidation or frank pulmonary edema.  The patient would eventually benefit from follow-up two-view chest with cardiac leads removed.  IMPRESSION: Cardiomegaly.  Slightly tortuous aorta.  Mild central pulmonary vascular prominence.  Please see above.  Original Report Authenticated By: Fuller Canada, M.D.    Lab Results: Basic Metabolic Panel:  Basename 09/08/11 0430 09/07/11 0456 09/06/11 1330  NA 137 136 --  K 3.7 3.4* --  CL 105 102 --  CO2 24 25 --  GLUCOSE 83 89 --  BUN 11 12 --  CREATININE 1.13 1.27 --  CALCIUM 8.7 8.5 --  MG -- -- 1.8  PHOS -- -- --   Liver Function Tests: No results found for this basename: AST:2,ALT:2,ALKPHOS:2,BILITOT:2,PROT:2,ALBUMIN:2 in the last 72 hours   CBC:  Basename 09/08/11 0430 09/06/11 1330  WBC 8.0 7.7  NEUTROABS -- 3.9  HGB 14.5 16.7  HCT 42.6 47.5  MCV 92.8 91.0  PLT 161 176    Recent Results (from the past 240 hour(s))  MRSA PCR SCREENING     Status: Normal   Collection Time  09/06/11  7:10 PM      Component Value Range Status Comment   MRSA by PCR NEGATIVE  NEGATIVE  Final      Hospital Course: He was admitted with chest discomfort. He was started on IV nitroglycerin and heparin and placed in the ICU/step down unit. He has known coronary artery occlusive disease hypertension peripheral arterial disease hyperlipidemia COPD and BPH. With treatment he improved rapidly. He had cardiology consultation it was felt that he did not need any further testing as an inpatient. He was recommended that he get up and ambulate and if he did well with ambulation he could be discharged. He ambulated on the day of discharge with no complaints  Discharge Exam: Blood pressure 103/58, pulse 67, temperature 98.8 F (37.1 C), temperature source Oral, resp. rate 22, height 6' (1.829 m), weight 128.7 kg (283 lb 11.7 oz), SpO2 96.00%. He was awake and alert. His chest is  clear. His heart is regular. His abdomen is soft. He had no edema. He appeared comfortable  Disposition: Home we discussed home health services and he did not want to do that he will followup with Dr. Delbert Harness  Discharge Orders    Future Appointments: Provider: Department: Dept Phone: Center:   11/01/2011 1:00 PM [facility] [phone] [facility]     Future Orders Please Complete By Expires   Discharge patient         Follow-up Information    Follow up with Isabella Stalling, MD .         Signed: Fredirick Maudlin Pager [phone]  09/09/2011, 10:37 AM

## 2011-09-11 NOTE — Progress Notes (Signed)
Discharge summary sent to payer through [facility]  

## 2011-10-17 ENCOUNTER — Encounter: Payer: Self-pay | Admitting: Internal Medicine

## 2011-10-17 ENCOUNTER — Ambulatory Visit (INDEPENDENT_AMBULATORY_CARE_PROVIDER_SITE_OTHER): Payer: Medicare Other | Admitting: Internal Medicine

## 2011-10-17 VITALS — BP 110/70 | HR 79 | Ht 72.0 in | Wt 297.4 lb

## 2011-10-17 DIAGNOSIS — I10 Essential (primary) hypertension: Secondary | ICD-10-CM

## 2011-10-17 DIAGNOSIS — I422 Other hypertrophic cardiomyopathy: Secondary | ICD-10-CM

## 2011-10-17 NOTE — Assessment & Plan Note (Signed)
His blood pressure is well controlled. I've asked the patient to reduce his salt intake, and to lose weight. He will continue his current medical therapy.

## 2011-10-17 NOTE — Progress Notes (Signed)
HPI He is a very pleasant 56 year old male with a history of hypertension and probable hypertrophic cardiomyopathy. He was recently hospitalized with chest discomfort and ruled out for MI. The patient denies chest pain, shortness of breath, or peripheral edema. His weight is down a few pounds since his hospitalization. He has had no syncope. He denies problems with compliance. No Known Allergies   Current Outpatient Prescriptions  Medication Sig Dispense Refill  . aspirin 81 MG tablet Take 81 mg by mouth daily.        . cloNIDine (CATAPRES) 0.1 MG tablet Take 0.1 mg by mouth 2 (two) times daily.        . clopidogrel (PLAVIX) 75 MG tablet Take 75 mg by mouth daily.        [name] dexlansoprazole (DEXILANT) 60 MG capsule Take 60 mg by mouth daily before breakfast. Take one capsule by mouth 15 to 20 minutes prior to breakfast      . diltiazem (DILACOR XR) 240 MG 24 hr capsule Take 240 mg by mouth daily.      . Dutasteride-Tamsulosin HCl 0.5-0.4 MG CAPS Take 1 tablet by mouth at bedtime.       . hydrochlorothiazide (MICROZIDE) 12.5 MG capsule Take 12.5 mg by mouth daily.      . metoprolol succinate (TOPROL-XL) 50 MG 24 hr tablet Take 50 mg by mouth daily. Take with or immediately following a meal.      . NITROSTAT 0.4 MG SL tablet Dissolve one (1) tablet under the tongue at onset of chest pain--may repeat every . for upto 3 doses!  25 each  8  . simvastatin (ZOCOR) 40 MG tablet Take 40 mg by mouth daily.          Past Medical History  Diagnosis Date  . Hypertension     Severe LVH with normal EF  . Arteriosclerotic cardiovascular disease (ASCVD) 2005    catheterization in 10/2010:50% mid LAD, diffuse distal disease, circumflex irregularities, large dominant RCA with a 50% ostial, 70% distal, 60% posterolateral and 70% PDA; normal EF  . Cerebrovascular disease 2010    R. carotid endarterectomy; Duplex in 10/2010-widely patent ICAs, subtotal left vertebral-not thought to be contributing to symptoms    . Hyperlipidemia   . Obesity   . Tobacco abuse   . Benign prostatic hypertrophy   . Low back pain   . History of PSVT (paroxysmal supraventricular tachycardia) 2005    Diagnosed with ILR; no recurrence following RFA  . Cervical spine disease     CT in 2012-advanced degeneration and spondylosis with moderate spinal stenosis at C3-C6  . H/O: substance abuse     Cocaine, marijuana, alcohol  . Syncope   . Gastroesophageal reflux disease   . Depression   . Erectile dysfunction     ROS:   All systems reviewed and negative except as noted in the HPI.   Past Surgical History  Procedure Date  . Radiofrequency ablation 2005    for PSVT     Family History  Problem Relation Age of Onset  . Hypertension Mother     Cerebrovascular disease  . Coronary artery disease Father     Fatal MI  . Diabetes type II Father     also a sibling  . Hypertension Other     sibling  . Lung cancer Other      History   Social History  . Marital Status: Legally Separated    Spouse Name: N/A    Number of Children:  0  . Years of Education: N/A   Occupational History  . Not on file.   Social History Main Topics  . Smoking status: Current Everyday Smoker -- 1.0 packs/day for 40 years    Types: Cigarettes  . Smokeless tobacco: Not on file  . Alcohol Use: No  . Drug Use: Yes    Special: Cocaine     crack 2 weeks ago  . Sexually Active: Not on file   Other Topics Concern  . Not on file   Social History Narrative  . No narrative on file     BP 110/70  Pulse 79  Ht 6' (1.829 m)  Wt 297 lb 6.4 oz (134.9 kg)  BMI 40.33 kg/m2  Physical Exam:  Well appearing NAD HEENT: Unremarkable Neck:  No JVD, no thyromegally Lungs:  Clear with no wheezes, rales, or rhonchi. HEART:  Regular rate rhythm, no murmurs, no rubs, no clicks Abd:  soft, positive bowel sounds, no organomegally, no rebound, no guarding Ext:  2 plus pulses, no edema, no cyanosis, no clubbing Skin:  No rashes no  nodules Neuro:  CN II through XII intact, motor grossly intact  EKG Sinus tachycardia with left ventricular hypertrophy  Assess/Plan:

## 2011-10-17 NOTE — Patient Instructions (Signed)
Your physician wants you to follow-up in: 6 months with Dr Taylor You will receive a reminder letter in the mail two months in advance. If you don't receive a letter, please call our office to schedule the follow-up appointment.  

## 2011-10-17 NOTE — Assessment & Plan Note (Signed)
His symptoms appear to be well-controlled. He will continue his current medical therapy. 

## 2011-10-24 ENCOUNTER — Other Ambulatory Visit: Payer: Self-pay | Admitting: Cardiology

## 2011-10-24 MED ORDER — NITROGLYCERIN 0.4 MG SL SUBL: 0.4000 mg | SUBLINGUAL_TABLET | SUBLINGUAL | Status: DC | PRN

## 2011-10-31 ENCOUNTER — Encounter: Payer: Self-pay | Admitting: Neurosurgery

## 2011-11-01 ENCOUNTER — Encounter: Payer: Self-pay | Admitting: Neurosurgery

## 2011-11-01 ENCOUNTER — Ambulatory Visit (INDEPENDENT_AMBULATORY_CARE_PROVIDER_SITE_OTHER): Payer: Medicare Other | Admitting: Neurosurgery

## 2011-11-01 ENCOUNTER — Ambulatory Visit (INDEPENDENT_AMBULATORY_CARE_PROVIDER_SITE_OTHER): Payer: Medicare Other | Admitting: *Deleted

## 2011-11-01 VITALS — BP 133/86 | HR 65 | Resp 16 | Ht 72.0 in | Wt 297.1 lb

## 2011-11-01 DIAGNOSIS — I6529 Occlusion and stenosis of unspecified carotid artery: Secondary | ICD-10-CM | POA: Insufficient documentation

## 2011-11-01 DIAGNOSIS — Z48812 Encounter for surgical aftercare following surgery on the circulatory system: Secondary | ICD-10-CM

## 2011-11-01 NOTE — Progress Notes (Addendum)
[facility] HISTORY AND PHYSICAL   CC: Annual carotid duplex Referring Physician: Early  History of Present Illness: 56 year old male patient of Dr. Arbie Cookey seen for annual carotid duplex status post right CEA in February 2010. Patient's currently asymptomatic he denies any signs or symptoms of CVA, TIA, amaurosis fugax or neural deficit. Patient reports no new medical diagnoses or any recent surgery.  Past Medical History  Diagnosis Date  . Hypertension     Severe LVH with normal EF  . Arteriosclerotic cardiovascular disease (ASCVD) 2005    catheterization in 10/2010:50% mid LAD, diffuse distal disease, circumflex irregularities, large dominant RCA with a 50% ostial, 70% distal, 60% posterolateral and 70% PDA; normal EF  . Cerebrovascular disease 2010    R. carotid endarterectomy; Duplex in 10/2010-widely patent ICAs, subtotal left vertebral-not thought to be contributing to symptoms  . Hyperlipidemia   . Obesity   . Tobacco abuse   . Benign prostatic hypertrophy   . Low back pain   . History of PSVT (paroxysmal supraventricular tachycardia) 2005    Diagnosed with ILR; no recurrence following RFA  . Cervical spine disease     CT in 2012-advanced degeneration and spondylosis with moderate spinal stenosis at C3-C6  . H/O: substance abuse     Cocaine, marijuana, alcohol  . Syncope   . Gastroesophageal reflux disease   . Depression   . Erectile dysfunction   . Irregular heart beat   . Carotid artery occlusion     ROS: [x]  Positive   [ ]  Denies    General: [ ]  Weight loss, [ ]  Fever, [ ]  chills Neurologic: [ ]  Dizziness, [ ]  Blackouts, [ ]  Seizure [ ]  Stroke, [ ]  "Mini stroke", [ ]  Slurred speech, [ ]  Temporary blindness; [ ]  weakness in arms or legs, [ ]  Hoarseness Cardiac: [ ]  Chest pain/pressure, [x ] Shortness of breath at rest [ ]  Shortness of breath with exertion, [ ]  Atrial fibrillation or irregular heartbeat Vascular: [ x] Pain in legs with  walking, [ ]  Pain in legs at rest, [ ]  Pain in legs at night,  [ ]  Non-healing ulcer, [ ]  Blood clot in vein/DVT,   Pulmonary: [ ]  Home oxygen, [ ]  Productive cough, [ ]  Coughing up blood, [ ]  Asthma,  [ ]  Wheezing Musculoskeletal:  [ ]  Arthritis, [ ]  Low back pain, [ ]  Joint pain Hematologic: [ ]  Easy Bruising, [ ]  Anemia; [ ]  Hepatitis Gastrointestinal: [ ]  Blood in stool, [ ]  Gastroesophageal Reflux/heartburn, [ ]  Trouble swallowing Urinary: [ ]  chronic Kidney disease, [ ]  on HD - [ ]  MWF or [ ]  TTHS, [ ]  Burning with urination, [ ]  Difficulty urinating Skin: [ ]  Rashes, [ ]  Wounds Psychological: [ ]  Anxiety, [ ]  Depression   Social History History  Substance Use Topics  . Smoking status: Current Everyday Smoker -- 1.0 packs/day for 40 years    Types: Cigarettes  . Smokeless tobacco: Not on file  . Alcohol Use: No    Family History Family History  Problem Relation Age of Onset  . Hypertension Mother     Cerebrovascular disease  . Diabetes Mother   . Coronary artery disease Father     Fatal MI  . Diabetes type II Father     also a sibling  . Diabetes Father     Amputation  . Hypertension Father   . Heart attack Father   . Hypertension Other     sibling  .  Lung cancer Other   . Diabetes Sister   . Hypertension Sister   . Heart attack Sister   . Diabetes Brother   . Hypertension Brother     No Known Allergies  Current Outpatient Prescriptions  Medication Sig Dispense Refill  . aspirin 81 MG tablet Take 81 mg by mouth daily.        . cloNIDine (CATAPRES) 0.1 MG tablet Take 0.1 mg by mouth 2 (two) times daily.        . clopidogrel (PLAVIX) 75 MG tablet Take 75 mg by mouth daily.        [name] dexlansoprazole (DEXILANT) 60 MG capsule Take 60 mg by mouth daily before breakfast. Take one capsule by mouth 15 to 20 minutes prior to breakfast      . diltiazem (DILACOR XR) 240 MG 24 hr capsule Take 240 mg by mouth daily.      . Dutasteride-Tamsulosin HCl 0.5-0.4 MG CAPS Take 1  tablet by mouth at bedtime.       . hydrochlorothiazide (MICROZIDE) 12.5 MG capsule Take 12.5 mg by mouth daily.      . metoprolol succinate (TOPROL-XL) 50 MG 24 hr tablet Take 50 mg by mouth daily. Take with or immediately following a meal.      . nitroGLYCERIN (NITROSTAT) 0.4 MG SL tablet Place 1 tablet (0.4 mg total) under the tongue every 5 (five) minutes as needed for chest pain.  25 tablet  6  . simvastatin (ZOCOR) 40 MG tablet Take 40 mg by mouth daily.         Physical Examination  Filed Vitals:   11/01/11 1429  BP: 133/86  Pulse: 65  Resp: 16    Body mass index is 40.29 kg/(m^2).  General:  WDWN in NAD Gait: Normal HEENT: WNL Eyes: Pupils equal Pulmonary: normal non-labored breathing , without Rales, rhonchi,  wheezing Cardiac: RRR, without  Murmurs, rubs or gallops; Abdomen: soft, NT, no masses Skin: no rashes, ulcers noted  Vascular Exam Pulses: 3+ radial pulses bilaterally Carotid bruits: Carotid pulses to auscultation no bruits are heard Extremities without ischemic changes, no Gangrene , no cellulitis; no open wounds;  Musculoskeletal: no muscle wasting or atrophy   Neurologic: A&O X 3; Appropriate Affect ; SENSATION: normal; MOTOR FUNCTION:  moving all extremities equally. Speech is fluent/normal  Non-Invasive Vascular Imaging CAROTID DUPLEX 11/01/2011  Right ICA 0 - 19% stenosis Left ICA 20 - 39 % stenosis There is no change in the vertebral flow the right vertebral artery is antegrade, left vertebral artery antegrade proximally unable to evaluate distally  ASSESSMENT/PLAN: Asymptomatic patient with a history of right CEA 3 years ago. The patient is a signs and symptoms of CVA and noticed report to the nearest emergency room should this occur. Laminal be for him to return in one year for repeat carotid duplex and be seen in my clinic. His questions were encouraged and answered.  Lauree Chandler ANP   Clinic MD: Edilia Bo

## 2011-11-02 ENCOUNTER — Encounter: Payer: Self-pay | Admitting: Neurosurgery

## 2011-11-02 NOTE — Progress Notes (Signed)
Addended by: Sharee Pimple on: 11/02/2011 10:53 AM   Modules accepted: Orders

## 2011-11-07 NOTE — Procedures (Unsigned)
CAROTID DUPLEX EXAM  INDICATION:  Follow up carotid artery disease.  HISTORY: Diabetes:  No. Cardiac:  Arrhythmia, MI, cardioablation. Hypertension:  Yes. Smoking:  Yes. Previous Surgery:  Right carotid endarterectomy, 07/30/2008. CV History:  Asymptomatic. Amaurosis Fugax No, Paresthesias No, Hemiparesis No.                                      RIGHT             LEFT Brachial systolic pressure:         118               132 Brachial Doppler waveforms:         Triphasic         Triphasic Vertebral direction of flow:        Antegrade         Antegrade (proximal) DUPLEX VELOCITIES (cm/sec) CCA peak systolic                   126 (proximal)    119 ECA peak systolic                   123               142 ICA peak systolic                   104 (distal)      93 (distal) ICA end diastolic                   39                36 PLAQUE MORPHOLOGY:                  Heterogenous      Heterogenous PLAQUE AMOUNT:                      Minimal           Mild to moderate PLAQUE LOCATION:                    ICA               Bifurcation, ICA, ECA  IMPRESSION: 1. Patent right carotid endarterectomy site with no evidence for     restenosis. 2. 1% to 39% left internal carotid artery stenosis. 3. Right vertebral artery antegrade. 4. Left vertebral artery is antegrade proximally; however, unable to     evaluate distally due to depth, appears abnormal.  ___________________________________________ Larina Earthly, M.D.  SS/MEDQ  D:  11/01/2011  T:  11/01/2011  Job:  [id number]

## 2011-12-11 ENCOUNTER — Ambulatory Visit (HOSPITAL_COMMUNITY)
Admission: RE | Admit: 2011-12-11 | Discharge: 2011-12-11 | Disposition: A | Discharge status: Home / Self Care | Payer: Medicare Other | Source: Ambulatory Visit | Attending: Family Medicine | Admitting: Family Medicine

## 2011-12-11 ENCOUNTER — Other Ambulatory Visit (HOSPITAL_COMMUNITY): Payer: Self-pay | Admitting: Family Medicine

## 2011-12-11 DIAGNOSIS — M79609 Pain in unspecified limb: Secondary | ICD-10-CM | POA: Insufficient documentation

## 2011-12-11 DIAGNOSIS — M1610 Unilateral primary osteoarthritis, unspecified hip: Secondary | ICD-10-CM | POA: Insufficient documentation

## 2011-12-11 DIAGNOSIS — M1990 Unspecified osteoarthritis, unspecified site: Secondary | ICD-10-CM

## 2011-12-11 DIAGNOSIS — M169 Osteoarthritis of hip, unspecified: Secondary | ICD-10-CM | POA: Insufficient documentation

## 2012-03-08 ENCOUNTER — Encounter (HOSPITAL_COMMUNITY): Payer: Self-pay | Admitting: Emergency Medicine

## 2012-03-08 ENCOUNTER — Emergency Department (HOSPITAL_COMMUNITY)
Admission: EM | Admit: 2012-03-08 | Discharge: 2012-03-09 | Disposition: A | Discharge status: Home / Self Care | Payer: Medicare Other | Attending: Emergency Medicine | Admitting: Emergency Medicine

## 2012-03-08 DIAGNOSIS — R079 Chest pain, unspecified: Secondary | ICD-10-CM

## 2012-03-08 DIAGNOSIS — Z7982 Long term (current) use of aspirin: Secondary | ICD-10-CM | POA: Insufficient documentation

## 2012-03-08 DIAGNOSIS — I252 Old myocardial infarction: Secondary | ICD-10-CM | POA: Insufficient documentation

## 2012-03-08 DIAGNOSIS — Z79899 Other long term (current) drug therapy: Secondary | ICD-10-CM | POA: Insufficient documentation

## 2012-03-08 DIAGNOSIS — I2510 Atherosclerotic heart disease of native coronary artery without angina pectoris: Secondary | ICD-10-CM | POA: Insufficient documentation

## 2012-03-08 DIAGNOSIS — I10 Essential (primary) hypertension: Secondary | ICD-10-CM | POA: Insufficient documentation

## 2012-03-08 LAB — CBC WITH DIFFERENTIAL/PLATELET
Basophils Absolute: 0.1 10*3/uL (ref 0.0–0.1)
Basophils Relative: 0 % (ref 0–1)
Eosinophils Absolute: 0 10*3/uL (ref 0.0–0.7)
Eosinophils Relative: 0 % (ref 0–5)
HCT: 48.4 % (ref 39.0–52.0)
Hemoglobin: 17.8 g/dL — ABNORMAL HIGH (ref 13.0–17.0)
Lymphocytes Relative: 20 % (ref 12–46)
Lymphs Abs: 2.4 10*3/uL (ref 0.7–4.0)
MCH: 33.6 pg (ref 26.0–34.0)
MCHC: 36.8 g/dL — ABNORMAL HIGH (ref 30.0–36.0)
MCV: 91.3 fL (ref 78.0–100.0)
Monocytes Absolute: 1.2 10*3/uL — ABNORMAL HIGH (ref 0.1–1.0)
Monocytes Relative: 10 % (ref 3–12)
Neutro Abs: 8.2 10*3/uL — ABNORMAL HIGH (ref 1.7–7.7)
Neutrophils Relative %: 70 % (ref 43–77)
Platelets: 194 10*3/uL (ref 150–400)
RBC: 5.3 MIL/uL (ref 4.22–5.81)
RDW: 13.1 % (ref 11.5–15.5)
WBC: 11.8 10*3/uL — ABNORMAL HIGH (ref 4.0–10.5)

## 2012-03-08 LAB — COMPREHENSIVE METABOLIC PANEL
ALT: 13 U/L (ref 0–53)
AST: 20 U/L (ref 0–37)
Albumin: 4.5 g/dL (ref 3.5–5.2)
Alkaline Phosphatase: 87 U/L (ref 39–117)
BUN: 20 mg/dL (ref 6–23)
CO2: 21 mEq/L (ref 19–32)
Calcium: 10.3 mg/dL (ref 8.4–10.5)
Chloride: 103 mEq/L (ref 96–112)
Creatinine, Ser: 1.55 mg/dL — ABNORMAL HIGH (ref 0.50–1.35)
GFR calc Af Amer: 56 mL/min — ABNORMAL LOW (ref >90)
GFR calc non Af Amer: 48 mL/min — ABNORMAL LOW (ref >90)
Glucose, Bld: 115 mg/dL — ABNORMAL HIGH (ref 70–99)
Potassium: 3.6 mEq/L (ref 3.5–5.1)
Sodium: 138 mEq/L (ref 135–145)
Total Bilirubin: 0.7 mg/dL (ref 0.3–1.2)
Total Protein: 8.7 g/dL — ABNORMAL HIGH (ref 6.0–8.3)

## 2012-03-08 LAB — TROPONIN I: Troponin I: <0.3 ng/mL (ref <0.30)

## 2012-03-08 NOTE — ED Provider Notes (Addendum)
History   This chart was scribed for Donnetta Hutching, MD by Toya Smothers. The patient was seen in room APA09/APA09. Patient's care was started at 2127.  CSN: [id number]  Arrival date & time 03/08/12  2127   First MD Initiated Contact with Patient 03/08/12 2306      Chief Complaint  Patient presents with  . Chest Pain  . Choking   Patient is a 56 y.o. male presenting with chest pain. The history is provided by the patient. No language interpreter was used.  Chest Pain The chest pain began 6 - 12 hours ago. Chest pain occurs constantly. The chest pain is improving. Associated with: at rest. The severity of the pain is moderate. The pain does not radiate. Chest pain is worsened by deep breathing and smoking. Pertinent negatives for primary symptoms include no fever, no syncope, no cough, no palpitations, no abdominal pain, no nausea, no vomiting and no altered mental status.  Pertinent negatives for associated symptoms include no numbness. He tried nothing for the symptoms. Risk factors include smoking/tobacco exposure and substance abuse.     Peter Dunlap is a 56 y.o. male with a h/o MI (2005), enlarged heart, and ASVCD who presents to the Emergency Department complaining of sudden onset constant severe chest pain today with associate SOB. Pain has worsened in the past 10 minuets and is aggravated and alleviated by nothing.. Onset while at rest. Pain is described more severe, but similar to previous episode of chest tightness. PTA Pt did not take nitro. No symptoms were treated. Pt denies fever, chills, palpations, emesis, nausea, and cough. Pt admits the use of cocaine and is a current everyday smoker.  Pt lists PCP as Dr. Delbert Harness and Dr. Sharrell Ku as Cardiologist.  Past Medical History  Diagnosis Date  . Hypertension     Severe LVH with normal EF  . Arteriosclerotic cardiovascular disease (ASCVD) 2005    catheterization in 10/2010:50% mid LAD, diffuse distal disease, circumflex  irregularities, large dominant RCA with a 50% ostial, 70% distal, 60% posterolateral and 70% PDA; normal EF  . Cerebrovascular disease 2010    R. carotid endarterectomy; Duplex in 10/2010-widely patent ICAs, subtotal left vertebral-not thought to be contributing to symptoms  . Hyperlipidemia   . Obesity   . Tobacco abuse   . Benign prostatic hypertrophy   . Low back pain   . History of PSVT (paroxysmal supraventricular tachycardia) 2005    Diagnosed with ILR; no recurrence following RFA  . Cervical spine disease     CT in 2012-advanced degeneration and spondylosis with moderate spinal stenosis at C3-C6  . H/O: substance abuse     Cocaine, marijuana, alcohol  . Syncope   . Gastroesophageal reflux disease   . Depression   . Erectile dysfunction   . Irregular heart beat   . Carotid artery occlusion     Past Surgical History  Procedure Date  . Radiofrequency ablation 2005    for PSVT  . Carotid endarterectomy 2010    Right CEA    Family History  Problem Relation Age of Onset  . Hypertension Mother     Cerebrovascular disease  . Diabetes Mother   . Coronary artery disease Father     Fatal MI  . Diabetes type II Father     also a sibling  . Diabetes Father     Amputation  . Hypertension Father   . Heart attack Father   . Hypertension Other     sibling  .  Lung cancer Other   . Diabetes Sister   . Hypertension Sister   . Heart attack Sister   . Diabetes Brother   . Hypertension Brother     History  Substance Use Topics  . Smoking status: Current Every Day Smoker -- 1.0 packs/day for 40 years    Types: Cigarettes  . Smokeless tobacco: Not on file  . Alcohol Use: No      Review of Systems  Constitutional: Negative for fever.  Respiratory: Positive for chest tightness. Negative for cough.   Cardiovascular: Positive for chest pain. Negative for palpitations and syncope.  Gastrointestinal: Negative for nausea, vomiting and abdominal pain.  Neurological: Negative  for numbness.  Psychiatric/Behavioral: Negative for altered mental status.  All other systems reviewed and are negative.    Allergies  Review of patient's allergies indicates no known allergies.  Home Medications   Current Outpatient Rx  Name Route Sig Dispense Refill  . ASPIRIN 81 MG PO TABS Oral Take 81 mg by mouth daily.      [name] CLONIDINE HCL 0.1 MG PO TABS Oral Take 0.1 mg by mouth 2 (two) times daily.      [name] CLOPIDOGREL BISULFATE 75 MG PO TABS Oral Take 75 mg by mouth daily.      . DEXLANSOPRAZOLE 60 MG PO CPDR Oral Take 60 mg by mouth daily before breakfast. Take one capsule by mouth 15 to 20 minutes prior to breakfast    . DILTIAZEM HCL ER 240 MG PO CP24 Oral Take 240 mg by mouth daily.    . DUTASTERIDE-TAMSULOSIN HCL 0.5-0.4 MG PO CAPS Oral Take 1 tablet by mouth at bedtime.     [name] HYDROCHLOROTHIAZIDE 12.5 MG PO CAPS Oral Take 12.5 mg by mouth daily.    [name] METOPROLOL SUCCINATE ER 50 MG PO TB24 Oral Take 50 mg by mouth daily. Take with or immediately following a meal.    . NITROGLYCERIN 0.4 MG SL SUBL Sublingual Place 1 tablet (0.4 mg total) under the tongue every 5 (five) minutes as needed for chest pain. 25 tablet 6  . SIMVASTATIN 40 MG PO TABS Oral Take 40 mg by mouth daily.       BP 150/96  Pulse 84  Temp 99.6 F (37.6 C) (Oral)  Resp 19  Ht 6' (1.829 m)  Wt 300 lb (136.079 kg)  BMI 40.69 kg/m2  SpO2 98%  Physical Exam  Nursing note and vitals reviewed. Constitutional: He is oriented to person, place, and time. He appears well-developed and well-nourished.  HENT:  Head: Normocephalic and atraumatic.  Eyes: Conjunctivae normal and EOM are normal. Pupils are equal, round, and reactive to light.  Neck: Normal range of motion. Neck supple.  Cardiovascular: Normal rate, regular rhythm and normal heart sounds.   Pulmonary/Chest: Effort normal and breath sounds normal.  Abdominal: Soft. Bowel sounds are normal.  Musculoskeletal: Normal range of motion.  Neurological: He  is alert and oriented to person, place, and time.  Skin: Skin is warm and dry.  Psychiatric: He has a normal mood and affect.    ED Course  Procedures  DIAGNOSTIC STUDIES: Oxygen Saturation is 99% on room air, normal by my interpretation.    COORDINATION OF CARE: 23:26- Evaluated PT. Pt is awake, alert, and oriented. Ordered EKG, CBC w differentials, Comprehensive metabolic panel, and DG chest port.   Labs Reviewed  CBC WITH DIFFERENTIAL - Abnormal; Notable for the following:    WBC 11.8 (*)     Hemoglobin 17.8 (*)  MCHC 36.8 (*)     Neutro Abs 8.2 (*)     Monocytes Absolute 1.2 (*)     All other components within normal limits  COMPREHENSIVE METABOLIC PANEL - Abnormal; Notable for the following:    Glucose, Bld 115 (*)     Creatinine, Ser 1.55 (*)     Total Protein 8.7 (*)     GFR calc non Af Amer 48 (*)     GFR calc Af Amer 56 (*)     All other components within normal limits  TROPONIN I   No results found.   No diagnosis found.   Date: 03/09/2012  Rate: 107  Rhythm: sinus tachycardia  QRS Axis: normal  Intervals: normal  ST/T Wave abnormalities: normal  Conduction Disutrbances:none  Narrative Interpretation:   Old EKG Reviewed: changes noted PAC, LVH   Dg Chest Port 1 View  03/09/2012  *RADIOLOGY REPORT*  Clinical Data: Chest pain  PORTABLE CHEST - 1 VIEW  Comparison: 09/06/2011  Findings: Stable cardiomegaly.  Lungs are clear.  No effusion. Regional bones unremarkable.  IMPRESSION:  Stable cardiomegaly   Original Report Authenticated By: Osa Craver, M.D.    MDM  Patient is hemodynamically stable. No chest pain at discharge. Suspect anginal spell     I personally performed the services described in this documentation, which was scribed in my presence. The recorded information has been reviewed and considered.     Donnetta Hutching, MD 03/09/12 0110  Donnetta Hutching, MD 04/02/12 [id number]

## 2012-03-08 NOTE — ED Notes (Signed)
Patient presents to ER via EMS with c/o chest pain that feels like he is choking.  Patient also c/o shortness of breath.

## 2012-03-09 ENCOUNTER — Emergency Department (HOSPITAL_COMMUNITY): Payer: Medicare Other

## 2012-03-09 MED ORDER — PANTOPRAZOLE SODIUM 40 MG IV SOLR
40.0000 mg | Freq: Once | INTRAVENOUS | Status: AC
  Administered 2012-03-09: 40 mg via INTRAVENOUS
  Filled 2012-03-09: qty 40

## 2012-04-22 ENCOUNTER — Ambulatory Visit (INDEPENDENT_AMBULATORY_CARE_PROVIDER_SITE_OTHER): Payer: Medicare Other | Admitting: Internal Medicine

## 2012-04-22 ENCOUNTER — Encounter: Payer: Self-pay | Admitting: Internal Medicine

## 2012-04-22 VITALS — BP 120/65 | HR 75 | Ht 72.0 in | Wt 303.4 lb

## 2012-04-22 DIAGNOSIS — I10 Essential (primary) hypertension: Secondary | ICD-10-CM

## 2012-04-22 DIAGNOSIS — F17200 Nicotine dependence, unspecified, uncomplicated: Secondary | ICD-10-CM

## 2012-04-22 DIAGNOSIS — I422 Other hypertrophic cardiomyopathy: Secondary | ICD-10-CM

## 2012-04-22 DIAGNOSIS — Z720 Tobacco use: Secondary | ICD-10-CM

## 2012-04-22 NOTE — Patient Instructions (Signed)
Your physician wants you to follow-up in: 12 months with Dr. Taylor. You will receive a reminder letter in the mail two months in advance. If you don't receive a letter, please call our office to schedule the follow-up appointment.    

## 2012-04-22 NOTE — Assessment & Plan Note (Signed)
He is well compensated well-controlled this point. Minimal symptoms.

## 2012-04-22 NOTE — Progress Notes (Signed)
HPI [name] returns today for followup. He is a very pleasant 59 show man with a history of hypertension and hypertrophic, nonobstructive, cardiomyopathy. The patient also has ongoing tobacco abuse and morbid obesity. When I saw the patient last several months ago, I asked the patient to lose weight. He has gained approximately 10 pounds in the interim. He admits to dietary indiscretion. He has occasional nonexertional chest pain. No Known Allergies   Current Outpatient Prescriptions  Medication Sig Dispense Refill  . aspirin 81 MG tablet Take 81 mg by mouth daily.        . cloNIDine (CATAPRES) 0.1 MG tablet Take 0.1 mg by mouth 2 (two) times daily.        . clopidogrel (PLAVIX) 75 MG tablet Take 75 mg by mouth daily.        [name] dexlansoprazole (DEXILANT) 60 MG capsule Take 60 mg by mouth daily before breakfast. Take one capsule by mouth 15 to 20 minutes prior to breakfast      . diltiazem (DILACOR XR) 240 MG 24 hr capsule Take 240 mg by mouth daily.      . Dutasteride-Tamsulosin HCl 0.5-0.4 MG CAPS Take 1 tablet by mouth at bedtime. 30mg       . hydrochlorothiazide (MICROZIDE) 12.5 MG capsule Take 12.5 mg by mouth daily.      . metoprolol succinate (TOPROL-XL) 50 MG 24 hr tablet Take 50 mg by mouth daily. Take with or immediately following a meal.      . nitroGLYCERIN (NITROSTAT) 0.4 MG SL tablet Place 1 tablet (0.4 mg total) under the tongue every 5 (five) minutes as needed for chest pain.  25 tablet  6  . simvastatin (ZOCOR) 40 MG tablet Take 40 mg by mouth daily.          Past Medical History  Diagnosis Date  . Hypertension     Severe LVH with normal EF  . Arteriosclerotic cardiovascular disease (ASCVD) 2005    catheterization in 10/2010:50% mid LAD, diffuse distal disease, circumflex irregularities, large dominant RCA with a 50% ostial, 70% distal, 60% posterolateral and 70% PDA; normal EF  . Cerebrovascular disease 2010    R. carotid endarterectomy; Duplex in 10/2010-widely patent  ICAs, subtotal left vertebral-not thought to be contributing to symptoms  . Hyperlipidemia   . Obesity   . Tobacco abuse   . Benign prostatic hypertrophy   . Low back pain   . History of PSVT (paroxysmal supraventricular tachycardia) 2005    Diagnosed with ILR; no recurrence following RFA  . Cervical spine disease     CT in 2012-advanced degeneration and spondylosis with moderate spinal stenosis at C3-C6  . H/O: substance abuse     Cocaine, marijuana, alcohol  . Syncope   . Gastroesophageal reflux disease   . Depression   . Erectile dysfunction   . Irregular heart beat   . Carotid artery occlusion     ROS:   All systems reviewed and negative except as noted in the HPI.   Past Surgical History  Procedure Date  . Radiofrequency ablation 2005    for PSVT  . Carotid endarterectomy 2010    Right CEA     Family History  Problem Relation Age of Onset  . Hypertension Mother     Cerebrovascular disease  . Diabetes Mother   . Coronary artery disease Father     Fatal MI  . Diabetes type II Father     also a sibling  . Diabetes Father  Amputation  . Hypertension Father   . Heart attack Father   . Hypertension Other     sibling  . Lung cancer Other   . Diabetes Sister   . Hypertension Sister   . Heart attack Sister   . Diabetes Brother   . Hypertension Brother      History   Social History  . Marital Status: Legally Separated    Spouse Name: N/A    Number of Children: 0  . Years of Education: N/A   Occupational History  . Not on file.   Social History Main Topics  . Smoking status: Current Every Day Smoker -- 1.0 packs/day for 40 years    Types: Cigarettes  . Smokeless tobacco: Not on file  . Alcohol Use: No  . Drug Use: Yes    Special: Cocaine     Comment: crack today  . Sexually Active: Not on file   Other Topics Concern  . Not on file   Social History Narrative  . No narrative on file     BP 120/65  Pulse 75  Ht 6' (1.829 m)  Wt 303  lb 6.4 oz (137.621 kg)  BMI 41.15 kg/m2  SpO2 99%  Physical Exam:  Well appearing obese, middle-age man, NAD HEENT: Unremarkable Neck:  7 cm JVD, no thyromegally Lungs:  Clear with no wheezes, rales, or rhonchi. HEART:  Regular rate rhythm, no murmurs, no rubs, no clicks Abd:  soft, positive bowel sounds, no organomegally, no rebound, no guarding Ext:  2 plus pulses, no edema, no cyanosis, no clubbing Skin:  No rashes no nodules Neuro:  CN II through XII intact, motor grossly intact  Assess/Plan:

## 2012-04-22 NOTE — Assessment & Plan Note (Signed)
Today I discuss importance of smoking cessation. He states that he will try to stop smoking.

## 2012-04-22 NOTE — Assessment & Plan Note (Signed)
His blood pressure is well controlled today. I've asked the patient to continue his current medical therapy, and maintain a low-sodium diet. In addition, I've asked the patient increase his physical activity.

## 2012-06-10 ENCOUNTER — Other Ambulatory Visit (HOSPITAL_COMMUNITY): Payer: Self-pay | Admitting: Urology

## 2012-06-10 DIAGNOSIS — R310 Gross hematuria: Secondary | ICD-10-CM

## 2012-06-13 ENCOUNTER — Other Ambulatory Visit (HOSPITAL_COMMUNITY): Payer: Self-pay | Admitting: Family Medicine

## 2012-06-13 ENCOUNTER — Ambulatory Visit (HOSPITAL_COMMUNITY)
Admission: RE | Admit: 2012-06-13 | Discharge: 2012-06-13 | Disposition: A | Discharge status: Home / Self Care | Payer: Medicare Other | Source: Ambulatory Visit | Attending: Urology | Admitting: Urology

## 2012-06-13 ENCOUNTER — Ambulatory Visit (HOSPITAL_COMMUNITY)
Admission: RE | Admit: 2012-06-13 | Discharge: 2012-06-13 | Disposition: A | Discharge status: Home / Self Care | Payer: Medicare Other | Source: Ambulatory Visit | Attending: Family Medicine | Admitting: Family Medicine

## 2012-06-13 DIAGNOSIS — R310 Gross hematuria: Secondary | ICD-10-CM

## 2012-06-13 DIAGNOSIS — K7689 Other specified diseases of liver: Secondary | ICD-10-CM | POA: Insufficient documentation

## 2012-06-13 DIAGNOSIS — R109 Unspecified abdominal pain: Secondary | ICD-10-CM | POA: Insufficient documentation

## 2012-06-13 DIAGNOSIS — M25569 Pain in unspecified knee: Secondary | ICD-10-CM | POA: Insufficient documentation

## 2012-06-13 DIAGNOSIS — M1990 Unspecified osteoarthritis, unspecified site: Secondary | ICD-10-CM

## 2012-06-13 MED ORDER — IOHEXOL 300 MG/ML  SOLN
125.0000 mL | Freq: Once | INTRAMUSCULAR | Status: AC | PRN
  Administered 2012-06-13: 125 mL via INTRAVENOUS

## 2012-10-25 ENCOUNTER — Other Ambulatory Visit (INDEPENDENT_AMBULATORY_CARE_PROVIDER_SITE_OTHER): Payer: Medicare Other | Admitting: Vascular Surgery

## 2012-10-25 DIAGNOSIS — Z48812 Encounter for surgical aftercare following surgery on the circulatory system: Secondary | ICD-10-CM

## 2012-10-25 DIAGNOSIS — I6529 Occlusion and stenosis of unspecified carotid artery: Secondary | ICD-10-CM

## 2012-10-29 ENCOUNTER — Other Ambulatory Visit: Payer: Self-pay | Admitting: *Deleted

## 2012-10-29 DIAGNOSIS — Z48812 Encounter for surgical aftercare following surgery on the circulatory system: Secondary | ICD-10-CM

## 2012-10-30 ENCOUNTER — Other Ambulatory Visit: Payer: Medicare Other

## 2012-10-30 ENCOUNTER — Ambulatory Visit: Payer: Medicare Other | Admitting: Neurosurgery

## 2012-11-01 ENCOUNTER — Encounter: Payer: Self-pay | Admitting: Vascular Surgery

## 2013-01-11 ENCOUNTER — Encounter (HOSPITAL_COMMUNITY): Payer: Self-pay | Admitting: *Deleted

## 2013-01-11 ENCOUNTER — Emergency Department (HOSPITAL_COMMUNITY)
Admission: EM | Admit: 2013-01-11 | Discharge: 2013-01-11 | Disposition: A | Discharge status: Home / Self Care | Payer: Medicare Other | Attending: Emergency Medicine | Admitting: Emergency Medicine

## 2013-01-11 ENCOUNTER — Emergency Department (HOSPITAL_COMMUNITY): Payer: Medicare Other

## 2013-01-11 DIAGNOSIS — M1990 Unspecified osteoarthritis, unspecified site: Secondary | ICD-10-CM

## 2013-01-11 DIAGNOSIS — Z8659 Personal history of other mental and behavioral disorders: Secondary | ICD-10-CM | POA: Insufficient documentation

## 2013-01-11 DIAGNOSIS — K219 Gastro-esophageal reflux disease without esophagitis: Secondary | ICD-10-CM | POA: Insufficient documentation

## 2013-01-11 DIAGNOSIS — Z87448 Personal history of other diseases of urinary system: Secondary | ICD-10-CM | POA: Insufficient documentation

## 2013-01-11 DIAGNOSIS — Z8739 Personal history of other diseases of the musculoskeletal system and connective tissue: Secondary | ICD-10-CM | POA: Insufficient documentation

## 2013-01-11 DIAGNOSIS — E785 Hyperlipidemia, unspecified: Secondary | ICD-10-CM | POA: Insufficient documentation

## 2013-01-11 DIAGNOSIS — M25561 Pain in right knee: Secondary | ICD-10-CM

## 2013-01-11 DIAGNOSIS — E669 Obesity, unspecified: Secondary | ICD-10-CM | POA: Insufficient documentation

## 2013-01-11 DIAGNOSIS — Z8679 Personal history of other diseases of the circulatory system: Secondary | ICD-10-CM | POA: Insufficient documentation

## 2013-01-11 DIAGNOSIS — I10 Essential (primary) hypertension: Secondary | ICD-10-CM | POA: Insufficient documentation

## 2013-01-11 DIAGNOSIS — M1710 Unilateral primary osteoarthritis, unspecified knee: Secondary | ICD-10-CM | POA: Insufficient documentation

## 2013-01-11 DIAGNOSIS — F17200 Nicotine dependence, unspecified, uncomplicated: Secondary | ICD-10-CM | POA: Insufficient documentation

## 2013-01-11 DIAGNOSIS — M25569 Pain in unspecified knee: Secondary | ICD-10-CM | POA: Insufficient documentation

## 2013-01-11 MED ORDER — OXYCODONE-ACETAMINOPHEN 5-325 MG PO TABS: 1.0000 | ORAL_TABLET | ORAL | Status: DC | PRN

## 2013-01-11 MED ORDER — MORPHINE SULFATE 4 MG/ML IJ SOLN
6.0000 mg | Freq: Once | INTRAMUSCULAR | Status: AC
  Administered 2013-01-11: 6 mg via INTRAMUSCULAR
  Filled 2013-01-11: qty 2

## 2013-01-11 MED ORDER — IBUPROFEN 800 MG PO TABS: 800.0000 mg | ORAL_TABLET | Freq: Three times a day (TID) | ORAL | Status: DC

## 2013-01-11 NOTE — ED Provider Notes (Signed)
TIME SEEN: 9:00 AM  CHIEF COMPLAINT: Right knee pain  HPI: Patient is a 57 y.o. African American male with a history of hypertension, hyperlipidemia, tobacco abuse, CAD, prior right knee pain he presents emergency department 3 days of right knee pain. He states that he does not arm or any injury but is having pain in the lateral aspect and posterior aspect of his knee. It is worse with full extension. He denies any numbness or tingling. No focal weakness. He has had very mild swelling to his right knee. No prior history of septic arthritis. No recent injections in the knee joint. No history of IV drug abuse. Patient is able to ambulate but has some pain with doing so. No prior history of DVT or PE. Denies any recent fever. No lower extremity swelling.  ROS: See HPI Constitutional: no fever  Eyes: no drainage  ENT: no runny nose   Cardiovascular:  no chest pain  Resp: no SOB  GI: no vomiting GU: no dysuria Integumentary: no rash  Allergy: no hives  Musculoskeletal: no leg swelling  Neurological: no slurred speech ROS otherwise negative  PAST MEDICAL HISTORY/PAST SURGICAL HISTORY:  Past Medical History  Diagnosis Date  . Hypertension     Severe LVH with normal EF  . Arteriosclerotic cardiovascular disease (ASCVD) 2005    catheterization in 10/2010:50% mid LAD, diffuse distal disease, circumflex irregularities, large dominant RCA with a 50% ostial, 70% distal, 60% posterolateral and 70% PDA; normal EF  . Cerebrovascular disease 2010    R. carotid endarterectomy; Duplex in 10/2010-widely patent ICAs, subtotal left vertebral-not thought to be contributing to symptoms  . Hyperlipidemia   . Obesity   . Tobacco abuse   . Benign prostatic hypertrophy   . Low back pain   . History of PSVT (paroxysmal supraventricular tachycardia) 2005    Diagnosed with ILR; no recurrence following RFA  . Cervical spine disease     CT in 2012-advanced degeneration and spondylosis with moderate spinal stenosis  at C3-C6  . H/O: substance abuse     Cocaine, marijuana, alcohol  . Syncope   . Gastroesophageal reflux disease   . Depression   . Erectile dysfunction   . Irregular heart beat   . Carotid artery occlusion     MEDICATIONS:  Prior to Admission medications   Medication Sig Start Date End Date Taking? Authorizing Provider  aspirin 81 MG tablet Take 81 mg by mouth daily.      Historical Provider, MD  cloNIDine (CATAPRES) 0.1 MG tablet Take 0.1 mg by mouth 2 (two) times daily.      Historical Provider, MD  clopidogrel (PLAVIX) 75 MG tablet Take 75 mg by mouth daily.      Historical Provider, MD  dexlansoprazole (DEXILANT) 60 MG capsule Take 60 mg by mouth daily before breakfast. Take one capsule by mouth 15 to 20 minutes prior to breakfast    Historical Provider, MD  diltiazem (DILACOR XR) 240 MG 24 hr capsule Take 240 mg by mouth daily.    Historical Provider, MD  Dutasteride-Tamsulosin HCl 0.5-0.4 MG CAPS Take 1 tablet by mouth at bedtime. 30mg     Historical Provider, MD  hydrochlorothiazide (MICROZIDE) 12.5 MG capsule Take 12.5 mg by mouth daily.    Historical Provider, MD  metoprolol succinate (TOPROL-XL) 50 MG 24 hr tablet Take 50 mg by mouth daily. Take with or immediately following a meal.    Historical Provider, MD  nitroGLYCERIN (NITROSTAT) 0.4 MG SL tablet Place 1 tablet (0.4 mg  total) under the tongue every 5 (five) minutes as needed for chest pain. 10/24/11   Marinus Maw, MD  simvastatin (ZOCOR) 40 MG tablet Take 40 mg by mouth daily.     Historical Provider, MD    ALLERGIES:  No Known Allergies  SOCIAL HISTORY:  History  Substance Use Topics  . Smoking status: Current Every Day Smoker -- 1.00 packs/day for 40 years    Types: Cigarettes  . Smokeless tobacco: Not on file  . Alcohol Use: No    FAMILY HISTORY: Family History  Problem Relation Age of Onset  . Hypertension Mother     Cerebrovascular disease  . Diabetes Mother   . Coronary artery disease Father      Fatal MI  . Diabetes type II Father     also a sibling  . Diabetes Father     Amputation  . Hypertension Father   . Heart attack Father   . Hypertension Other     sibling  . Lung cancer Other   . Diabetes Sister   . Hypertension Sister   . Heart attack Sister   . Diabetes Brother   . Hypertension Brother     EXAM: BP 165/55  Pulse 77  Temp(Src) 98.4 F (36.9 C) (Oral)  Resp 22  SpO2 99% CONSTITUTIONAL: Alert and oriented and responds appropriately to questions. Well-appearing; well-nourished HEAD: Normocephalic EYES: Conjunctivae clear, PERRL ENT: normal nose; no rhinorrhea; moist mucous membranes; pharynx without lesions noted NECK: Supple, no meningismus, no LAD  CARD: RRR; S1 and S2 appreciated; no murmurs, no clicks, no rubs, no gallops RESP: Normal chest excursion without splinting or tachypnea; breath sounds clear and equal bilaterally; no wheezes, no rhonchi, no rales,  ABD/GI: Normal bowel sounds; non-distended; soft, non-tender, no rebound, no guarding BACK:  The back appears normal and is non-tender to palpation, there is no CVA tenderness EXT: Patient has tenderness to palpation over the lateral right in the joint line and posterior knee, pain with full extension but normal range of motion in his right knee, very minimal joint effusion, no erythema or warmth, no ligamentous laxity, 2+ DP pulses bilaterally, , feet are warm and well perfused, otherwise Normal ROM in all joints; non-tender to palpation; no edema; normal capillary refill; no cyanosis    SKIN: Normal color for age and race; warm NEURO: Moves all extremities equally PSYCH: The patient's mood and manner are appropriate. Grooming and personal hygiene are appropriate.  MEDICAL DECISION MAKING: Patient with exacerbation of his chronic right knee pain. History of acute injury. Patient does have pain with testing his meniscus. No ligamentous laxity. No signs of septic arthritis. Will obtain x-ray and give  analgesic medication. Anticipate discharge home with close PCP followup.  ED PROGRESS:  Patient's x-ray shows arthritic changes. No bony injury. His pain is improved. He has improved range of motion. Patient reports that he has a cane to use at home. We'll discharge him with pain medications and PCP followup. Given customary and usual return precautions.    Layla Maw Ward, DO 01/11/13 1022

## 2013-01-11 NOTE — ED Notes (Signed)
PT reports Pain in RT knee.

## 2013-01-13 ENCOUNTER — Emergency Department (HOSPITAL_COMMUNITY): Payer: Medicare Other

## 2013-01-13 ENCOUNTER — Encounter (HOSPITAL_COMMUNITY): Payer: Self-pay | Admitting: Cardiology

## 2013-01-13 ENCOUNTER — Observation Stay (HOSPITAL_COMMUNITY)
Admission: EM | Admit: 2013-01-13 | Discharge: 2013-01-15 | Disposition: A | Discharge status: Home / Self Care | Payer: Medicare Other | Attending: Cardiology | Admitting: Cardiology

## 2013-01-13 DIAGNOSIS — F17200 Nicotine dependence, unspecified, uncomplicated: Secondary | ICD-10-CM | POA: Insufficient documentation

## 2013-01-13 DIAGNOSIS — R55 Syncope and collapse: Secondary | ICD-10-CM

## 2013-01-13 DIAGNOSIS — I10 Essential (primary) hypertension: Secondary | ICD-10-CM

## 2013-01-13 DIAGNOSIS — Z720 Tobacco use: Secondary | ICD-10-CM

## 2013-01-13 DIAGNOSIS — N400 Enlarged prostate without lower urinary tract symptoms: Secondary | ICD-10-CM

## 2013-01-13 DIAGNOSIS — I422 Other hypertrophic cardiomyopathy: Secondary | ICD-10-CM

## 2013-01-13 DIAGNOSIS — E785 Hyperlipidemia, unspecified: Secondary | ICD-10-CM

## 2013-01-13 DIAGNOSIS — R262 Difficulty in walking, not elsewhere classified: Secondary | ICD-10-CM | POA: Insufficient documentation

## 2013-01-13 DIAGNOSIS — R42 Dizziness and giddiness: Secondary | ICD-10-CM

## 2013-01-13 DIAGNOSIS — N529 Male erectile dysfunction, unspecified: Secondary | ICD-10-CM

## 2013-01-13 DIAGNOSIS — M549 Dorsalgia, unspecified: Secondary | ICD-10-CM

## 2013-01-13 DIAGNOSIS — K219 Gastro-esophageal reflux disease without esophagitis: Secondary | ICD-10-CM

## 2013-01-13 DIAGNOSIS — I509 Heart failure, unspecified: Secondary | ICD-10-CM | POA: Insufficient documentation

## 2013-01-13 DIAGNOSIS — I471 Supraventricular tachycardia, unspecified: Secondary | ICD-10-CM

## 2013-01-13 DIAGNOSIS — I679 Cerebrovascular disease, unspecified: Secondary | ICD-10-CM

## 2013-01-13 DIAGNOSIS — I5033 Acute on chronic diastolic (congestive) heart failure: Principal | ICD-10-CM

## 2013-01-13 DIAGNOSIS — R079 Chest pain, unspecified: Secondary | ICD-10-CM | POA: Insufficient documentation

## 2013-01-13 DIAGNOSIS — G609 Hereditary and idiopathic neuropathy, unspecified: Secondary | ICD-10-CM

## 2013-01-13 DIAGNOSIS — I2510 Atherosclerotic heart disease of native coronary artery without angina pectoris: Secondary | ICD-10-CM

## 2013-01-13 DIAGNOSIS — R0602 Shortness of breath: Secondary | ICD-10-CM

## 2013-01-13 DIAGNOSIS — I739 Peripheral vascular disease, unspecified: Secondary | ICD-10-CM | POA: Insufficient documentation

## 2013-01-13 HISTORY — DX: Heart failure, unspecified: I50.9

## 2013-01-13 HISTORY — DX: Unspecified osteoarthritis, unspecified site: M19.90

## 2013-01-13 HISTORY — DX: Personal history of other diseases of the digestive system: Z87.19

## 2013-01-13 LAB — CREATININE, SERUM
Creatinine, Ser: 1.34 mg/dL (ref 0.50–1.35)
GFR calc Af Amer: 66 mL/min — ABNORMAL LOW (ref >90)
GFR calc non Af Amer: 57 mL/min — ABNORMAL LOW (ref >90)

## 2013-01-13 LAB — CBC
HCT: 44.7 % (ref 39.0–52.0)
HCT: 42.7 % (ref 39.0–52.0)
Hemoglobin: 15.8 g/dL (ref 13.0–17.0)
Hemoglobin: 15.8 g/dL (ref 13.0–17.0)
MCH: 32.2 pg (ref 26.0–34.0)
MCH: 33.3 pg (ref 26.0–34.0)
MCHC: 35.3 g/dL (ref 30.0–36.0)
MCHC: 37 g/dL — ABNORMAL HIGH (ref 30.0–36.0)
MCV: 91 fL (ref 78.0–100.0)
MCV: 90.1 fL (ref 78.0–100.0)
Platelets: 185 10*3/uL (ref 150–400)
Platelets: 202 10*3/uL (ref 150–400)
RBC: 4.91 MIL/uL (ref 4.22–5.81)
RBC: 4.74 MIL/uL (ref 4.22–5.81)
RDW: 13.2 % (ref 11.5–15.5)
RDW: 13 % (ref 11.5–15.5)
WBC: 10 10*3/uL (ref 4.0–10.5)
WBC: 13.1 10*3/uL — ABNORMAL HIGH (ref 4.0–10.5)

## 2013-01-13 LAB — TROPONIN I
Troponin I: <0.3 ng/mL (ref <0.30)
Troponin I: <0.3 ng/mL (ref <0.30)

## 2013-01-13 LAB — POCT I-STAT, CHEM 8
BUN: 13 mg/dL (ref 6–23)
Calcium, Ion: 1.19 mmol/L (ref 1.12–1.23)
Chloride: 104 mEq/L (ref 96–112)
Creatinine, Ser: 1.4 mg/dL — ABNORMAL HIGH (ref 0.50–1.35)
Glucose, Bld: 146 mg/dL — ABNORMAL HIGH (ref 70–99)
HCT: 47 % (ref 39.0–52.0)
Hemoglobin: 16 g/dL (ref 13.0–17.0)
Potassium: 3.9 mEq/L (ref 3.5–5.1)
Sodium: 140 mEq/L (ref 135–145)
TCO2: 25 mmol/L (ref 0–100)

## 2013-01-13 LAB — BASIC METABOLIC PANEL
BUN: 14 mg/dL (ref 6–23)
CO2: 24 mEq/L (ref 19–32)
Calcium: 9.3 mg/dL (ref 8.4–10.5)
Chloride: 102 mEq/L (ref 96–112)
Creatinine, Ser: 1.24 mg/dL (ref 0.50–1.35)
GFR calc Af Amer: 73 mL/min — ABNORMAL LOW (ref >90)
GFR calc non Af Amer: 63 mL/min — ABNORMAL LOW (ref >90)
Glucose, Bld: 150 mg/dL — ABNORMAL HIGH (ref 70–99)
Potassium: 4.1 mEq/L (ref 3.5–5.1)
Sodium: 138 mEq/L (ref 135–145)

## 2013-01-13 LAB — POCT I-STAT TROPONIN I: Troponin i, poc: 0.05 ng/mL (ref 0.00–0.08)

## 2013-01-13 LAB — PRO B NATRIURETIC PEPTIDE: Pro B Natriuretic peptide (BNP): 1472 pg/mL — ABNORMAL HIGH (ref 0–125)

## 2013-01-13 MED ORDER — DUTASTERIDE-TAMSULOSIN HCL 0.5-0.4 MG PO CAPS: 1.0000 | ORAL_CAPSULE | Freq: Every day | ORAL | Status: DC

## 2013-01-13 MED ORDER — SODIUM CHLORIDE 0.9 % IV SOLN: 250.0000 mL | INTRAVENOUS | Status: DC | PRN

## 2013-01-13 MED ORDER — NITROGLYCERIN 0.4 MG SL SUBL: 0.4000 mg | SUBLINGUAL_TABLET | SUBLINGUAL | Status: DC | PRN

## 2013-01-13 MED ORDER — SODIUM CHLORIDE 0.9 % IJ SOLN: 3.0000 mL | INTRAMUSCULAR | Status: DC | PRN

## 2013-01-13 MED ORDER — CLONIDINE HCL 0.1 MG PO TABS
0.1000 mg | ORAL_TABLET | Freq: Two times a day (BID) | ORAL | Status: DC
  Administered 2013-01-13 – 2013-01-15 (×4): 0.1 mg via ORAL
  Filled 2013-01-13 (×5): qty 1

## 2013-01-13 MED ORDER — TAMSULOSIN HCL 0.4 MG PO CAPS
0.4000 mg | ORAL_CAPSULE | Freq: Every day | ORAL | Status: DC
  Administered 2013-01-14: 0.4 mg via ORAL
  Filled 2013-01-13 (×3): qty 1

## 2013-01-13 MED ORDER — ASPIRIN 81 MG PO CHEW
81.0000 mg | CHEWABLE_TABLET | Freq: Every day | ORAL | Status: DC
  Administered 2013-01-14 – 2013-01-15 (×2): 81 mg via ORAL
  Filled 2013-01-13 (×3): qty 1

## 2013-01-13 MED ORDER — MORPHINE SULFATE 4 MG/ML IJ SOLN: 4.0000 mg | Freq: Once | INTRAMUSCULAR | Status: DC

## 2013-01-13 MED ORDER — ONDANSETRON HCL 4 MG/2ML IJ SOLN: 4.0000 mg | Freq: Four times a day (QID) | INTRAMUSCULAR | Status: DC | PRN

## 2013-01-13 MED ORDER — CYCLOSPORINE 0.05 % OP EMUL
1.0000 [drp] | Freq: Two times a day (BID) | OPHTHALMIC | Status: DC
  Administered 2013-01-14 – 2013-01-15 (×3): 1 [drp] via OPHTHALMIC
  Filled 2013-01-13 (×5): qty 1

## 2013-01-13 MED ORDER — PANTOPRAZOLE SODIUM 40 MG PO TBEC
40.0000 mg | DELAYED_RELEASE_TABLET | Freq: Every day | ORAL | Status: DC
  Administered 2013-01-14 – 2013-01-15 (×2): 40 mg via ORAL
  Filled 2013-01-13 (×3): qty 1

## 2013-01-13 MED ORDER — ASPIRIN 81 MG PO TABS: 81.0000 mg | ORAL_TABLET | Freq: Every day | ORAL | Status: DC

## 2013-01-13 MED ORDER — CLOPIDOGREL BISULFATE 75 MG PO TABS
75.0000 mg | ORAL_TABLET | Freq: Every day | ORAL | Status: DC
  Administered 2013-01-13 – 2013-01-15 (×3): 75 mg via ORAL
  Filled 2013-01-13 (×3): qty 1

## 2013-01-13 MED ORDER — SODIUM CHLORIDE 0.9 % IJ SOLN
3.0000 mL | Freq: Two times a day (BID) | INTRAMUSCULAR | Status: DC
  Administered 2013-01-13 – 2013-01-15 (×4): 3 mL via INTRAVENOUS

## 2013-01-13 MED ORDER — DUTASTERIDE 0.5 MG PO CAPS
0.5000 mg | ORAL_CAPSULE | Freq: Every day | ORAL | Status: DC
Start: 2013-01-13 — End: 2013-01-15
  Administered 2013-01-13 – 2013-01-14 (×2): 0.5 mg via ORAL
  Filled 2013-01-13 (×3): qty 1

## 2013-01-13 MED ORDER — OXYCODONE-ACETAMINOPHEN 5-325 MG PO TABS
2.0000 | ORAL_TABLET | Freq: Once | ORAL | Status: AC
  Administered 2013-01-13: 2 via ORAL
  Filled 2013-01-13: qty 2

## 2013-01-13 MED ORDER — FUROSEMIDE 10 MG/ML IJ SOLN
40.0000 mg | Freq: Once | INTRAMUSCULAR | Status: AC
  Administered 2013-01-13: 40 mg via INTRAVENOUS

## 2013-01-13 MED ORDER — ACETAMINOPHEN 325 MG PO TABS: 650.0000 mg | ORAL_TABLET | ORAL | Status: DC | PRN

## 2013-01-13 MED ORDER — SIMVASTATIN 40 MG PO TABS
40.0000 mg | ORAL_TABLET | Freq: Every day | ORAL | Status: DC
  Administered 2013-01-14 – 2013-01-15 (×2): 40 mg via ORAL
  Filled 2013-01-13 (×3): qty 1

## 2013-01-13 MED ORDER — HEPARIN SODIUM (PORCINE) 5000 UNIT/ML IJ SOLN
5000.0000 [IU] | Freq: Three times a day (TID) | INTRAMUSCULAR | Status: DC
  Administered 2013-01-13 – 2013-01-15 (×5): 5000 [IU] via SUBCUTANEOUS
  Filled 2013-01-13 (×8): qty 1

## 2013-01-13 MED ORDER — OXYCODONE-ACETAMINOPHEN 5-325 MG PO TABS
1.0000 | ORAL_TABLET | ORAL | Status: DC | PRN
  Administered 2013-01-13 – 2013-01-14 (×4): 1 via ORAL
  Filled 2013-01-13 (×4): qty 1

## 2013-01-13 MED ORDER — METOPROLOL SUCCINATE ER 50 MG PO TB24
50.0000 mg | ORAL_TABLET | Freq: Every day | ORAL | Status: DC
  Administered 2013-01-13 – 2013-01-15 (×3): 50 mg via ORAL
  Filled 2013-01-13 (×3): qty 1

## 2013-01-13 MED ORDER — TAMSULOSIN HCL 0.4 MG PO CAPS
0.4000 mg | ORAL_CAPSULE | Freq: Every day | ORAL | Status: DC
  Administered 2013-01-15: 0.4 mg via ORAL
  Filled 2013-01-13 (×3): qty 1

## 2013-01-13 MED ORDER — ASPIRIN 81 MG PO CHEW
324.0000 mg | CHEWABLE_TABLET | Freq: Once | ORAL | Status: AC
  Administered 2013-01-13: 243 mg via ORAL
  Filled 2013-01-13: qty 4

## 2013-01-13 NOTE — Progress Notes (Signed)
CARDIOLOGY ADMISSION NOTE  Patient ID: Peter Dunlap MRN: [id number] DOB/AGE: 1956/04/16 57 y.o.  Admit date: 01/13/2013 Primary Physician   DONDIEGO,RICHARD Judie Petit, MD Primary Cardiologist   Dr. [name] Chief Complaint    Dyspnea, dizziness  HPI:  The patient has a history of CAD as described below with the last cath in 2012.  He has been treated medically for this.  He was last admitted for chest pain in 2013 and ruled out.  There was no further work up.  He also has a history of HCM.  EF December 2013 was 75%.  He has had ablation of SVT.    Patient presents for evaluation of episodes of dizziness. He's also been having progressive dyspnea today. He says he often had breath or was worse today at rest. He seems to be describing some PND or orthopnea at night. He's not describing any acute weight gain or any acute edema though he has some chronic lower extremity edema. He's been having waves of dizziness. He's not describing any new tachycardia palpitations or symptoms similar to his previous SVT. He's not describing any chest pressure, neck or arm discomfort. He came into the emergency room because of multiple complaints. In the ER his BNP was slightly elevated.  Initial enzymes were negative.  She is noted to have occasional junctional bradycardia intermittent with his regular sinus rhythm. It's not clear that these correlate in the emergency room for complaints of dizziness.   Past Medical History  Diagnosis Date  . Hypertension     Severe LVH with normal EF  . Arteriosclerotic cardiovascular disease (ASCVD) 2005    catheterization in 10/2010:50% mid LAD, diffuse distal disease, circumflex irregularities, large dominant RCA with a 50% ostial, 70% distal, 60% posterolateral and 70% PDA; normal EF  . Cerebrovascular disease 2010    R. carotid endarterectomy; Duplex in 10/2010-widely patent ICAs, subtotal left vertebral-not thought to be contributing to symptoms  . Hyperlipidemia   . Obesity     . Tobacco abuse   . Benign prostatic hypertrophy   . Low back pain   . History of PSVT (paroxysmal supraventricular tachycardia) 2005    Diagnosed with ILR; no recurrence following RFA  . Cervical spine disease     CT in 2012-advanced degeneration and spondylosis with moderate spinal stenosis at C3-C6  . H/O: substance abuse     Cocaine, marijuana, alcohol  . Syncope   . Gastroesophageal reflux disease   . Depression   . Erectile dysfunction   . Irregular heart beat   . Carotid artery occlusion     Past Surgical History  Procedure Laterality Date  . Radiofrequency ablation  2005    for PSVT  . Carotid endarterectomy  2010    Right CEA    Allergies  Allergen Reactions  . Lactose Intolerance (Gi)    No current facility-administered medications on file prior to encounter.   Current Outpatient Prescriptions on File Prior to Encounter  Medication Sig Dispense Refill  . aspirin 81 MG tablet Take 81 mg by mouth daily.       . cloNIDine (CATAPRES) 0.1 MG tablet Take 0.1 mg by mouth 2 (two) times daily.       . clopidogrel (PLAVIX) 75 MG tablet Take 75 mg by mouth daily.       . cycloSPORINE (RESTASIS) 0.05 % ophthalmic emulsion Place 1 drop into both eyes 2 (two) times daily.      [name] dexlansoprazole (DEXILANT) 60 MG capsule Take 60 mg by  mouth daily.      [name] diltiazem (DILACOR XR) 240 MG 24 hr capsule Take 240 mg by mouth daily.      . Dutasteride-Tamsulosin HCl 0.5-0.4 MG CAPS Take 1 tablet by mouth at bedtime.       [name] ibuprofen (ADVIL,MOTRIN) 800 MG tablet Take 1 tablet (800 mg total) by mouth 3 (three) times daily.  30 tablet  0  . metoprolol succinate (TOPROL-XL) 50 MG 24 hr tablet Take 50 mg by mouth daily.       . naproxen (NAPROSYN) 500 MG tablet Take 500 mg by mouth 2 (two) times daily as needed (pain).      [name] oxyCODONE-acetaminophen (PERCOCET/ROXICET) 5-325 MG per tablet Take 1 tablet by mouth every 4 (four) hours as needed for pain.  15 tablet  0  . simvastatin (ZOCOR) 40  MG tablet Take 40 mg by mouth daily.       . tamsulosin (FLOMAX) 0.4 MG CAPS capsule Take 0.4 mg by mouth daily.      . nitroGLYCERIN (NITROSTAT) 0.4 MG SL tablet Place 0.4 mg under the tongue every 5 (five) minutes as needed for chest pain.       History   Social History  . Marital Status: Legally Separated    Spouse Name: N/A    Number of Children: 0  . Years of Education: N/A   Occupational History  . Not on file.   Social History Main Topics  . Smoking status: Current Every Day Smoker -- 1.00 packs/day for 40 years    Types: Cigarettes  . Smokeless tobacco: Not on file  . Alcohol Use: No  . Drug Use: Yes    Special: Cocaine     Comment: crack today  . Sexually Active: Not on file   Other Topics Concern  . Not on file   Social History Narrative  . No narrative on file    Family History  Problem Relation Age of Onset  . Hypertension Mother     Cerebrovascular disease  . Diabetes Mother   . Coronary artery disease Father     Fatal MI  . Diabetes type II Father     also a sibling  . Diabetes Father     Amputation  . Hypertension Father   . Heart attack Father   . Hypertension Other     sibling  . Lung cancer Other   . Diabetes Sister   . Hypertension Sister   . Heart attack Sister   . Diabetes Brother   . Hypertension Brother     ROS:  As stated in the HPI and negative for all other systems.  Physical Exam: Blood pressure 157/63, pulse 66, temperature 98.6 F (37 C), temperature source Oral, resp. rate 16, SpO2 99.00%.  GENERAL:  Well appearing HEENT:  Pupils equal round and reactive, fundi not visualized, oral mucosa unremarkable NECK:  No jugular venous distention, waveform within normal limits, carotid upstroke brisk and symmetric, no bruits, no thyromegaly, healed right carotid endarterectomy scar LYMPHATICS:  No cervical, inguinal adenopathy LUNGS:  Clear to auscultation bilaterally BACK:  No CVA tenderness CHEST:  Unremarkable HEART:  PMI not  displaced or sustained,S1 and S2 within normal limits, no S3, no S4, no clicks, no rubs, apical systolic murmur slightly radiating out the aortic outflow tract, no change with Valsalva, no diastolic murmurs ABD:  Flat, positive bowel sounds normal in frequency in pitch, no bruits, no rebound, no guarding, no midline pulsatile mass, no hepatomegaly, no splenomegaly,  obese EXT:  2 plus pulses throughout, trace edema, no cyanosis no clubbing SKIN:  No rashes no nodules NEURO:  Cranial nerves II through XII grossly intact, motor grossly intact throughout PSYCH:  Cognitively intact, oriented to person place and time  Labs: Lab Results  Component Value Date   BUN 13 01/13/2013   Lab Results  Component Value Date   CREATININE 1.40* 01/13/2013   Lab Results  Component Value Date   NA 140 01/13/2013   K 3.9 01/13/2013   CL 104 01/13/2013   CO2 24 01/13/2013   Lab Results  Component Value Date   TROPONINI <0.30 01/13/2013   Lab Results  Component Value Date   WBC 10.0 01/13/2013   HGB 16.0 01/13/2013   HCT 47.0 01/13/2013   MCV 91.0 01/13/2013   PLT 185 01/13/2013     Radiology:     CXR:  1. Low lung volumes with mild bibasilar atelectasis. 2. Stable cardiomegaly.  EKG:  NSR rate 89, axis within normal limits, intervals within normal limits, left into the hypertrophy with repolarization changes.  ASSESSMENT AND PLAN:    DYSPNEA:  He might have some slight volume overload. I'll give him a dose of IV diuresis tonight. We will need to watch this closely with the slightly elevated creatinine. Mostly I think he needs education about salt and volume restriction. I will repeat an echocardiogram as it has been over one year.  DIZZINESS: This could be related to his intermittent bradycardia. I will hold his Cardizem. He'll probably need further titration of other antihypertensives however.  HTN:  As above  CAD:  He is symptoms are not particularly consistent with ischemia. He will continue the meds  as listed above.  SignedRollene Rotunda 01/13/2013, 8:16 PM

## 2013-01-13 NOTE — ED Provider Notes (Signed)
CSN: [id number]     Arrival date & time 01/13/13  1648 History     First MD Initiated Contact with Patient 01/13/13 1714     Chief Complaint  Patient presents with  . Shortness of Breath  . Congestive Heart Failure   (Consider location/radiation/quality/duration/timing/severity/associated sxs/prior Treatment) HPI Pt is a 57yo male with hx of irregular heart beat, ASCVD with heart cath 10/2010, HTN with severe LVH and admittance on 09/06/11 for unstable angina presenting today with 2 week hx of bilateral feet swelling, SOB exacerbated by lying down and walking.  States last week he went to [facility] and could not make it halfway through the store because he had to keep sitting down to catch his breath.  Reports earlier today around 1500 he was eating and all of a sudden felt dizzy and became diaphoretic.  He then tried to lie down as this normally helps when he feels dizzy but instead he felt a pressure in his chest and upper abdomen as well as SOB, "it felt like I was choking."  Pt reports hx of abnormal heart rhythm with an ablation tx about 10 years ago.  Reports smoking cessation in May with increase in 15lbs since then but denies hx of asthma or COPD.  Also feels occasional nausea but no vomiting or cough.    Past Medical History  Diagnosis Date  . Hypertension     Severe LVH with normal EF  . Arteriosclerotic cardiovascular disease (ASCVD) 2005    catheterization in 10/2010:50% mid LAD, diffuse distal disease, circumflex irregularities, large dominant RCA with a 50% ostial, 70% distal, 60% posterolateral and 70% PDA; normal EF  . Cerebrovascular disease 2010    R. carotid endarterectomy; Duplex in 10/2010-widely patent ICAs, subtotal left vertebral-not thought to be contributing to symptoms  . Hyperlipidemia   . Obesity   . Tobacco abuse     Quit 2014  . Benign prostatic hypertrophy   . Low back pain   . History of PSVT (paroxysmal supraventricular tachycardia) 2005    Diagnosed with  ILR; no recurrence following RFA  . Cervical spine disease     CT in 2012-advanced degeneration and spondylosis with moderate spinal stenosis at C3-C6  . H/O: substance abuse     Cocaine, marijuana, alcohol.  Quit 2013.   [name] Syncope   . Gastroesophageal reflux disease   . Depression   . Erectile dysfunction    Past Surgical History  Procedure Laterality Date  . Radiofrequency ablation  2005    for PSVT  . Carotid endarterectomy  2010    Right CEA   Family History  Problem Relation Age of Onset  . Hypertension Mother     Cerebrovascular disease  . Diabetes Mother   . Coronary artery disease Father 19  . Diabetes type II Father   . Hypertension Father   . Lung cancer Paternal Uncle   . Diabetes Sister   . Hypertension Sister   . Heart attack Sister 76  . Diabetes Brother   . Hypertension Brother    History  Substance Use Topics  . Smoking status: Former Smoker -- 1.00 packs/day for 40 years    Types: Cigarettes  . Smokeless tobacco: Not on file     Comment: Quit in May.   . Alcohol Use: No    Review of Systems  Respiratory: Positive for shortness of breath. Negative for cough.   Cardiovascular: Positive for chest pain and leg swelling. Negative for palpitations.  Gastrointestinal: Positive for  nausea. Negative for vomiting.  Neurological: Positive for light-headedness.  All other systems reviewed and are negative.    Allergies  Lactose intolerance (gi)  Home Medications   Current Outpatient Rx  Name  Route  Sig  Dispense  Refill  . aspirin 81 MG tablet   Oral   Take 81 mg by mouth daily.          . cloNIDine (CATAPRES) 0.1 MG tablet   Oral   Take 0.1 mg by mouth 2 (two) times daily.          . clopidogrel (PLAVIX) 75 MG tablet   Oral   Take 75 mg by mouth daily.          . cycloSPORINE (RESTASIS) 0.05 % ophthalmic emulsion   Both Eyes   Place 1 drop into both eyes 2 (two) times daily.         [name] dexlansoprazole (DEXILANT) 60 MG capsule    Oral   Take 60 mg by mouth daily.         [name] diltiazem (DILACOR XR) 240 MG 24 hr capsule   Oral   Take 240 mg by mouth daily.         . Dutasteride-Tamsulosin HCl 0.5-0.4 MG CAPS   Oral   Take 1 tablet by mouth at bedtime.          [name] ibuprofen (ADVIL,MOTRIN) 800 MG tablet   Oral   Take 1 tablet (800 mg total) by mouth 3 (three) times daily.   30 tablet   0   . metoprolol succinate (TOPROL-XL) 50 MG 24 hr tablet   Oral   Take 50 mg by mouth daily.          . naproxen (NAPROSYN) 500 MG tablet   Oral   Take 500 mg by mouth 2 (two) times daily as needed (pain).         [name] oxyCODONE-acetaminophen (PERCOCET/ROXICET) 5-325 MG per tablet   Oral   Take 1 tablet by mouth every 4 (four) hours as needed for pain.   15 tablet   0   . simvastatin (ZOCOR) 40 MG tablet   Oral   Take 40 mg by mouth daily.          . tamsulosin (FLOMAX) 0.4 MG CAPS capsule   Oral   Take 0.4 mg by mouth daily.         . nitroGLYCERIN (NITROSTAT) 0.4 MG SL tablet   Sublingual   Place 0.4 mg under the tongue every 5 (five) minutes as needed for chest pain.          BP 146/61  Pulse 66  Temp(Src) 98.6 F (37 C) (Oral)  Resp 26  SpO2 99% Physical Exam  Nursing note and vitals reviewed. Constitutional: He appears well-developed and well-nourished.  Pt is diaphoretic, on nasal cannula w/o respiratory distress at this time.   HENT:  Head: Normocephalic and atraumatic.  Eyes: Conjunctivae are normal. No scleral icterus.  Neck: Normal range of motion. Neck supple.  Cardiovascular: Normal rate, regular rhythm, normal heart sounds and intact distal pulses.   Pulmonary/Chest: Effort normal. No respiratory distress. He has no wheezes. He has rales ( lower lung fields). He exhibits no tenderness.  Able to speak in full sentences   Abdominal: Soft. Bowel sounds are normal. He exhibits no distension and no mass. There is no tenderness. There is no rebound and no guarding.  Musculoskeletal:  Normal range of motion. He exhibits no edema.  Neurological: He is alert.  Skin: Skin is warm. He is diaphoretic.    ED Course   Procedures (including critical care time)  Labs Reviewed  BASIC METABOLIC PANEL - Abnormal; Notable for the following:    Glucose, Bld 150 (*)    GFR calc non Af Amer 63 (*)    GFR calc Af Amer 73 (*)    All other components within normal limits  PRO B NATRIURETIC PEPTIDE - Abnormal; Notable for the following:    Pro B Natriuretic peptide (BNP) 1472.0 (*)    All other components within normal limits  POCT I-STAT, CHEM 8 - Abnormal; Notable for the following:    Creatinine, Ser 1.40 (*)    Glucose, Bld 146 (*)    All other components within normal limits  CBC  TROPONIN I  POCT I-STAT TROPONIN I   Dg Chest Port 1 View  01/13/2013   *RADIOLOGY REPORT*  Clinical Data: Shortness of breath.  Dizziness.  Congestive heart failure.  PORTABLE CHEST - 1 VIEW  Comparison: 03/09/2012  Findings: Low lung volumes are noted with mild bibasilar atelectasis.  No evidence of acute infiltrate or edema.  No evidence of pleural effusion.  Cardiomegaly remains stable.  IMPRESSION:  1.  Low lung volumes with mild bibasilar atelectasis. 2.  Stable cardiomegaly.   Original Report Authenticated By: Myles Rosenthal, M.D.   1. Acute on chronic diastolic HF (heart failure)     MDM  Concern for CHF.  Will get cardiac workup, give aspirin and reassess.  Pt will likely need to be admitted for CHF exacerbation.   9:07 PM While waiting on labs to be draw, pt had multiple episodes of bradying down with associated dizziness and SOB.  Dr. Fayrene Fearing made aware.    istat chem 8: unremarkable.   9:07 PM CXR consistent with hx of cardiomegaly however not remarkable for CHF.  Pt's arrhythmia with bradycardia into 30s associated with dizziness, most concerning at this time.  Will consult [facility] to admit pt for further workup.    istat troponin: WNL.  Regular troponin pending.  BNP: pending,  however, due to unremarkable CXR, not expecting this lab to make much of a change in tx plan at this time.    9:07 PM Spoke with Dr. Patty Sermons with [facility] cardiology who agreed to come see pt.   9:07 PM Pt c/o tooth pain that has been going on for past 3 days, pt does have dental abscess.  Will give some PO pain meds for abscess.   Pt admitted for Acute on Chronic diastolic HF.    Junius Finner, PA-C 01/13/13 2107

## 2013-01-13 NOTE — ED Notes (Signed)
To ED via [facility] with c/o shortness of breath -- worsening for past two days. 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

## 2013-01-13 NOTE — ED Provider Notes (Signed)
EKG sinus rhythm LVH no ST or T wave changes noted.  Patient seen and evaluated with a PA. He is to be seen by cardiology. Having episodes of sinus bradycardia in the 30s  Claudean Kinds, MD 01/13/13 2047

## 2013-01-14 DIAGNOSIS — I5033 Acute on chronic diastolic (congestive) heart failure: Secondary | ICD-10-CM

## 2013-01-14 DIAGNOSIS — I517 Cardiomegaly: Secondary | ICD-10-CM

## 2013-01-14 LAB — BASIC METABOLIC PANEL
BUN: 14 mg/dL (ref 6–23)
CO2: 23 mEq/L (ref 19–32)
Calcium: 9.1 mg/dL (ref 8.4–10.5)
Chloride: 101 mEq/L (ref 96–112)
Creatinine, Ser: 1.4 mg/dL — ABNORMAL HIGH (ref 0.50–1.35)
GFR calc Af Amer: 63 mL/min — ABNORMAL LOW (ref >90)
GFR calc non Af Amer: 54 mL/min — ABNORMAL LOW (ref >90)
Glucose, Bld: 115 mg/dL — ABNORMAL HIGH (ref 70–99)
Potassium: 3.8 mEq/L (ref 3.5–5.1)
Sodium: 138 mEq/L (ref 135–145)

## 2013-01-14 LAB — TROPONIN I
Troponin I: <0.3 ng/mL (ref <0.30)
Troponin I: <0.3 ng/mL (ref <0.30)

## 2013-01-14 LAB — TSH: TSH: 1.684 u[IU]/mL (ref 0.350–4.500)

## 2013-01-14 MED ORDER — OXYCODONE-ACETAMINOPHEN 5-325 MG PO TABS
2.0000 | ORAL_TABLET | ORAL | Status: DC | PRN
Start: 2013-01-14 — End: 2013-01-15
  Administered 2013-01-14 – 2013-01-15 (×3): 2 via ORAL
  Filled 2013-01-14 (×3): qty 2

## 2013-01-14 NOTE — Progress Notes (Signed)
PT STATED THAT HIS PRIMARY MD DISCONTINUED HIS FLOMAX DUE TO BLOOD IN HIS URINE. CHANGED TO RAPAFLO 8MG  TAKING ONE CAPSULE QD.

## 2013-01-14 NOTE — Progress Notes (Signed)
Pt has c/o pain to right jaw, which is swollen, pt states he had a filling to fall out about 3 weeks ago and has had pain to right jaw area x 3 days.  Pt states he mentioned the area of swelling and pain to someone when he came to the ER.  Upon exam note swollen tissue to right lower jaw. Pt has been taking percocet here for the pain. Pt states he did not mention the area to the MD that rounded this am.   Notified cardiology NP Ward Givens of the above.

## 2013-01-14 NOTE — Progress Notes (Signed)
ELECTROPHYSIOLOGY ROUNDING NOTE    Patient Name: Peter Dunlap Date of Encounter: 01/14/2013    SUBJECTIVE:Patient feels better this morning.  His shortness of breath is improved.  He has a several month history of intermittent dizziness.  These spells last for seconds and then resolve.  He is s/p slow pathway modification in 2010 by Dr [name].  He has had no recurrent SVT.    Severe hypertensive heart disease vs HCM 25-30 mm walls with LAE   Cardizem was discontinued yesterday. He is still on Toprol 50mg  daily.   I/O -725 yesterday.   TELEMETRY: Reviewed telemetry pt in sinus rhythm with intermittent junctional rhythm. : Filed Vitals:   01/13/13 2058 01/13/13 2210 01/14/13 0205 01/14/13 0611  BP: 146/61 152/68 117/66 127/73  Pulse:  70 71 75  Temp:  98.7 F (37.1 C) 99 F (37.2 C) 98.9 F (37.2 C)  TempSrc:  Oral Oral Oral  Resp: 26 18 18 18   Height:  6' (1.829 m)    Weight:  318 lb 1.6 oz (144.289 kg)  320 lb 4.8 oz (145.287 kg)  SpO2:  100% 99% 100%    Intake/Output Summary (Last 24 hours) at 01/14/13 0716 Last data filed at 01/14/13 0500  Gross per 24 hour  Intake      0 ml  Output    725 ml  Net   -725 ml    CURRENT MEDICATIONS: . aspirin  81 mg Oral Daily  . cloNIDine  0.1 mg Oral BID  . clopidogrel  75 mg Oral Daily  . cycloSPORINE  1 drop Both Eyes BID  . dutasteride  0.5 mg Oral QHS   And  . tamsulosin  0.4 mg Oral QHS  . heparin  5,000 Units Subcutaneous Q8H  . metoprolol succinate  50 mg Oral Daily  . pantoprazole  40 mg Oral Daily  . simvastatin  40 mg Oral Daily  . sodium chloride  3 mL Intravenous Q12H  . tamsulosin  0.4 mg Oral Daily    LABS: Basic Metabolic Panel:  Recent Labs  81/19/14 1802 01/13/13 1843 01/13/13 2226 01/14/13 0520  NA 138 140  --  138  K 4.1 3.9  --  3.8  CL 102 104  --  101  CO2 24  --   --  23  GLUCOSE 150* 146*  --  115*  BUN 14 13  --  14  CREATININE 1.24 1.40* 1.34 1.40*  CALCIUM 9.3  --   --  9.1    CBC:  Recent Labs  01/13/13 1802 01/13/13 1843 01/13/13 2226  WBC 10.0  --  13.1*  HGB 15.8 16.0 15.8  HCT 44.7 47.0 42.7  MCV 91.0  --  90.1  PLT 185  --  202   Cardiac Enzymes:  Recent Labs  01/13/13 1804 01/13/13 2226 01/14/13 0520  TROPONINI <0.30 <0.30 <0.30    Radiology/Studies:  Dg Chest Port 1 View 01/13/2013   *RADIOLOGY REPORT*  Clinical Data: Shortness of breath.  Dizziness.  Congestive heart failure.  PORTABLE CHEST - 1 VIEW  Comparison: 03/09/2012  Findings: Low lung volumes are noted with mild bibasilar atelectasis.  No evidence of acute infiltrate or edema.  No evidence of pleural effusion.  Cardiomegaly remains stable.  IMPRESSION:  1.  Low lung volumes with mild bibasilar atelectasis. 2.  Stable cardiomegaly.   Original Report Authenticated By: Myles Rosenthal, M.D.    PHYSICAL EXAM  Well developed and nourished in no acute distress HENT normal  Neck supple with JVP-flat Clear Regular rate and rhythm, 2/6 murmur Abd-soft with active BS No Clubbing cyanosis edema Skin-warm and dry A & Oriented  Grossly normal sensory and motor function    Would decrease toprol to 25   Anticipate discharge in am  Also need to clarfity whether pt really has HCM or not.  He has no children but many sibs who have veen seen in [place]

## 2013-01-14 NOTE — Progress Notes (Signed)
Echo Lab  2D Echocardiogram completed.  Melissa L Morford, RDCS 01/14/2013 9:49 AM

## 2013-01-14 NOTE — Progress Notes (Signed)
Utilization Review Completed.   Kimberly Tucker, RN, BSN Nurse Case Manager  [phone]  

## 2013-01-14 NOTE — Progress Notes (Signed)
Pt having increased mouth pain, right jaw swollen.

## 2013-01-15 LAB — BASIC METABOLIC PANEL
BUN: 15 mg/dL (ref 6–23)
CO2: 27 mEq/L (ref 19–32)
Calcium: 9 mg/dL (ref 8.4–10.5)
Chloride: 102 mEq/L (ref 96–112)
Creatinine, Ser: 1.32 mg/dL (ref 0.50–1.35)
GFR calc Af Amer: 68 mL/min — ABNORMAL LOW (ref >90)
GFR calc non Af Amer: 58 mL/min — ABNORMAL LOW (ref >90)
Glucose, Bld: 105 mg/dL — ABNORMAL HIGH (ref 70–99)
Potassium: 3.6 mEq/L (ref 3.5–5.1)
Sodium: 138 mEq/L (ref 135–145)

## 2013-01-15 MED ORDER — METOPROLOL SUCCINATE ER 25 MG PO TB24: 25.0000 mg | ORAL_TABLET | Freq: Every day | ORAL | Status: DC

## 2013-01-15 NOTE — Progress Notes (Signed)
Pt discharged per w/c with all belongings. Accompanied by sister and Agricultural consultant. Aware of all home meds, follow up appts including outpatient stress test and to pick up meds from pharmacy on the way home.

## 2013-01-15 NOTE — Progress Notes (Signed)
Patient ID: Peter Dunlap, male   DOB: [birth year], 57 y.o.   MRN: [id number] Subjective:  Doing well. No chest pain or sob. Did not c/o jaw pain.  Objective:  Vital Signs in the last 24 hours: Temp:  [98.7 F (37.1 C)-99 F (37.2 C)] 99 F (37.2 C) (08/13 0524) Pulse Rate:  [53-75] 75 (08/13 0524) Resp:  [18] 18 (08/13 0524) BP: (117-127)/(66-68) 118/67 mmHg (08/13 0524) SpO2:  [99 %-100 %] 99 % (08/13 0524) Weight:  [315 lb 7.7 oz (143.1 kg)] 315 lb 7.7 oz (143.1 kg) (08/13 0524)  Intake/Output from previous day: 08/12 0701 - 08/13 0700 In: 720 [P.O.:720] Out: 1350 [Urine:1350] Intake/Output from this shift: Total I/O In: 240 [P.O.:240] Out: -   Physical Exam: Well appearing middle aged man, NAD HEENT: Unremarkable Neck:  7 cm JVD, no thyromegally Back:  No CVA tenderness Lungs:  Clear with no wheezes HEART:  Regular rate rhythm, no murmurs, no rubs, no clicks Abd:  Flat, positive bowel sounds, no organomegally, no rebound, no guarding Ext:  2 plus pulses, no edema, no cyanosis, no clubbing Skin:  No rashes no nodules Neuro:  CN II through XII intact, motor grossly intact  Lab Results:  Recent Labs  01/13/13 1802 01/13/13 1843 01/13/13 2226  WBC 10.0  --  13.1*  HGB 15.8 16.0 15.8  PLT 185  --  202    Recent Labs  01/14/13 0520 01/15/13 0425  NA 138 138  K 3.8 3.6  CL 101 102  CO2 23 27  GLUCOSE 115* 105*  BUN 14 15  CREATININE 1.40* 1.32    Recent Labs  01/14/13 0520 01/14/13 1030  TROPONINI <0.30 <0.30   Hepatic Function Panel No results found for this basename: PROT, ALBUMIN, AST, ALT, ALKPHOS, BILITOT, BILIDIR, IBILI,  in the last 72 hours No results found for this basename: CHOL,  in the last 72 hours No results found for this basename: PROTIME,  in the last 72 hours  Imaging: Dg Chest Port 1 View  01/13/2013   *RADIOLOGY REPORT*  Clinical Data: Shortness of breath.  Dizziness.  Congestive heart failure.  PORTABLE CHEST - 1 VIEW   Comparison: 03/09/2012  Findings: Low lung volumes are noted with mild bibasilar atelectasis.  No evidence of acute infiltrate or edema.  No evidence of pleural effusion.  Cardiomegaly remains stable.  IMPRESSION:  1.  Low lung volumes with mild bibasilar atelectasis. 2.  Stable cardiomegaly.   Original Report Authenticated By: Myles Rosenthal, M.D.    Cardiac Studies: Tele - NSR Assessment/Plan:  1. Acute on chronic diastolic chf 2. HTN 3. Sinus bradycardia Rec: ok to discharge today as he is euvolemic. He will need early followup in PA clinic with [facility].  LOS: 2 days    Lewayne Bunting 01/15/2013, 8:56 AM

## 2013-01-15 NOTE — Plan of Care (Signed)
Problem: Phase I Progression Outcomes Goal: EF % per last Echo/documented,Core Reminder form on chart Outcome: Completed/Met Date Met:  01/15/13 01/14/13 EF per ECHO 65-70 %

## 2013-01-15 NOTE — Progress Notes (Signed)
Went over all discharge instructions with pt says he wants me to go over them with his wife when she comes " cause she does all my pills and stuff ".

## 2013-01-15 NOTE — Discharge Summary (Signed)
ELECTROPHYSIOLOGY DISCHARGE SUMMARY    Patient ID: Peter Dunlap,  MRN: [id number], DOB/AGE: 1955-09-09 57 y.o.  Admit date: 01/13/2013 Discharge date: 01/15/2013  Primary Care Physician: Oval Linsey, MD  Primary Cardiologist: Lewayne Bunting, MD  Primary Discharge Diagnosis:  1. Acute on chronic diastolic HF 2. Chest pain  Secondary Discharge Diagnoses:  1. CAD 2. HCM 3. HTN 4. History of polysubstance abuse (in recovery now for one year) 5. PVD 6. GERD 7. PSVT s/p RF ablation 8. Chronic low back pain  Procedures This Admission:  1. 2-D echo  Study Conclusions - Left ventricle: The cavity size was normal. Wall thickness was increased in a pattern of severe LVH. There was severe concentric hypertrophy. Systolic function was vigorous. The estimated ejection fraction was in the range of 65% to 70%. There was dynamic obstruction. Wall motion was normal; there were no regional wall motion abnormalities. Features are consistent with a pseudonormal left ventricular filling pattern, with concomitant abnormal relaxation and increased filling pressure (grade 2 diastolic dysfunction). - Pericardium, extracardiac: A trivial pericardial effusion was identified.  History and Hospital Course:  [name] is a 57 year old man with known CAD. His last cath was in 2012. He has been treated medically. He was last admitted for chest pain in 2013 and ruled out. There was no further work up at that time. He also has a history of HCM. EF December 2013 was 75%. He has also had SVT ablation.   He presented 01/13/2013 with SOB, orthopnea and dizziness. He was found to have acute on chronic diastolic HF. An echo was done with results as outlined above. He was diuresed with IV Lasix and his symptoms improved. He was also found to have intermittent bradycardia and his CCB was discontinued. BB was down-titrated. His rate and rhythm improved. He also reported intermittent exertional and  nonexertional chest pain although this has been ongoing for greater than one year. He denies any worsening pain. He denies syncope. It was recommended he undergo outpatient stress testing. He remains hemodynamically stable. He has been seen, examined and deemed stable for discharge home today by Dr. Lewayne Bunting.   He will have a Lexiscan Myoview on 01/21/2013 and follow-up with [employer], PA-C in 2 weeks.  Discharge Vitals: Blood pressure 120/69, pulse 78, temperature 99 F (37.2 C), temperature source Oral, resp. rate 18, height 6' (1.829 m), weight 315 lb 7.7 oz (143.1 kg), SpO2 99.00%.   Labs: Lab Results  Component Value Date   WBC 13.1* 01/13/2013   HGB 15.8 01/13/2013   HCT 42.7 01/13/2013   MCV 90.1 01/13/2013   PLT 202 01/13/2013     Recent Labs Lab 01/15/13 0425  NA 138  K 3.6  CL 102  CO2 27  BUN 15  CREATININE 1.32  CALCIUM 9.0  GLUCOSE 105*   Lab Results  Component Value Date   CKTOTAL 66 09/07/2011   CKMB 4.1* 09/07/2011   TROPONINI <0.30 01/14/2013     Disposition:  The patient is being discharged in stable condition.  Follow-up:     Follow-up Information   Follow up with [facility]) On 01/21/2013. (At 8:15 AM for stress test)    Specialty:  Cardiology   Contact information:   [address] [place] [address] [phone]      Follow up with Rick Duff, PA-C On 02/04/2013. (At 1:30 PM )    Specialty:  Cardiology   Contact information:   [phone]  [employer] [address] [place] [address] [phone]      Discharge Medications:    Medication List    STOP taking these medications       diltiazem 240 MG 24 hr capsule  Commonly known as:  DILACOR XR      TAKE these medications       aspirin 81 MG tablet  Take 81 mg by mouth daily.     cloNIDine 0.1 MG tablet  Commonly known as:  CATAPRES  Take 0.1 mg by mouth 2 (two) times daily.     clopidogrel 75 MG tablet  Commonly known as:  PLAVIX    Take 75 mg by mouth daily.     cycloSPORINE 0.05 % ophthalmic emulsion  Commonly known as:  RESTASIS  Place 1 drop into both eyes 2 (two) times daily.     DEXILANT 60 MG capsule  Generic drug:  dexlansoprazole  Take 60 mg by mouth daily.     Dutasteride-Tamsulosin HCl 0.5-0.4 MG Caps  Take 1 tablet by mouth at bedtime.     ibuprofen 800 MG tablet  Commonly known as:  ADVIL,MOTRIN  Take 1 tablet (800 mg total) by mouth 3 (three) times daily.     metoprolol succinate 25 MG 24 hr tablet  Commonly known as:  TOPROL-XL  Take 1 tablet (25 mg total) by mouth daily.     naproxen 500 MG tablet  Commonly known as:  NAPROSYN  Take 500 mg by mouth 2 (two) times daily as needed (pain).     nitroGLYCERIN 0.4 MG SL tablet  Commonly known as:  NITROSTAT  Place 0.4 mg under the tongue every 5 (five) minutes as needed for chest pain.     oxyCODONE-acetaminophen 5-325 MG per tablet  Commonly known as:  PERCOCET/ROXICET  Take 1 tablet by mouth every 4 (four) hours as needed for pain.     simvastatin 40 MG tablet  Commonly known as:  ZOCOR  Take 40 mg by mouth daily.     tamsulosin 0.4 MG Caps capsule  Commonly known as:  FLOMAX  Take 0.4 mg by mouth daily.       Duration of Discharge Encounter: Greater than 30 minutes including physician time.  Limmie Patricia, PA-C 01/15/2013, 12:47 PM

## 2013-01-15 NOTE — Progress Notes (Signed)
Went over all d/c info again with sister instead of wife. Pt just wants someone else to hear it.

## 2013-01-15 NOTE — Evaluation (Signed)
Physical Therapy Evaluation and Discharge Patient Details Name: Peter Dunlap MRN: [id number] DOB: April 11, 1956 Today's Date: 01/15/2013 Time: 4098-1191 PT Time Calculation (min): 19 min  PT Assessment / Plan / Recommendation History of Present Illness  Pt admitted with dizziness and dypsnea. The patient has a history of CAD as described below with the last cath in 2012.  He has been treated medically for this.  He was last admitted for chest pain in 2013 and ruled out.  There was no further work up.  He also has a history of HCM.  EF December 2013 was 75%.  He has had ablation of SVT.    Clinical Impression  Pt functioning at baseline with exception of onset of chest pain with activity. RN aware and is to alert MD. Pt safe to amb with straight cane. Pt with no further skilled PT needs at this time. PT signing off.    PT Assessment  Patent does not need any further PT services    Follow Up Recommendations  No PT follow up    Does the patient have the potential to tolerate intense rehabilitation      Barriers to Discharge        Equipment Recommendations       Recommendations for Other Services     Frequency      Precautions / Restrictions Precautions Precautions: None Restrictions Weight Bearing Restrictions: No   Pertinent Vitals/Pain 7/10 chest pain      Mobility  Bed Mobility Bed Mobility: Not assessed Transfers Transfers: Sit to Stand;Stand to Sit Sit to Stand: 7: Independent;With upper extremity assist;From chair/3-in-1 Stand to Sit: 7: Independent Details for Transfer Assistance: safe technique Ambulation/Gait Ambulation/Gait Assistance: 6: Modified independent (Device/Increase time) Ambulation Distance (Feet): 150 Feet Assistive device: Straight cane Ambulation/Gait Assistance Details: v/c's for appropriate sequecing of cane in L UE to assist with off-weighting R LE to assist with pain management. Gait Pattern: Step-through pattern;Decreased step length -  right;Decreased stance time - right;Decreased hip/knee flexion - right;Wide base of support Gait velocity: decreased General Gait Details: pt with onset of chest pain with amb and stair negotiation. once pt sat down pain started to diminish. SpO2 > 96% on room air t/o session Stairs: Yes Stairs Assistance: 5: Supervision Stairs Assistance Details (indicate cue type and reason): v/c's for sequencing Stair Management Technique: One rail Right;With cane Number of Stairs: 5 (to mimic home set up)    Exercises     PT Diagnosis:    PT Problem List:   PT Treatment Interventions:       PT Goals(Current goals can be found in the care plan section) Acute Rehab PT Goals Patient Stated Goal: home PT Goal Formulation: No goals set, d/c therapy  Visit Information  Last PT Received On: 01/15/13 Assistance Needed: +1 History of Present Illness: Pt admitted with dizziness and dypsnea. The patient has a history of CAD as described below with the last cath in 2012.  He has been treated medically for this.  He was last admitted for chest pain in 2013 and ruled out.  There was no further work up.  He also has a history of HCM.  EF December 2013 was 75%.  He has had ablation of SVT.         Prior Functioning  Home Living Family/patient expects to be discharged to:: Private residence Living Arrangements: Spouse/significant other Available Help at Discharge: Family;Available 24 hours/day Type of Home: House Home Access: Stairs to enter [employer] of Steps: 5  Entrance Stairs-Rails: Can reach both Home Layout: One level Home Equipment: Cane - single point Prior Function Level of Independence: Independent Comments: pt uses cane intermittently when R knee in pain Communication Communication: No difficulties Dominant Hand: Right    Cognition  Cognition Arousal/Alertness: Awake/alert Behavior During Therapy: WFL for tasks assessed/performed Overall Cognitive Status: Within Functional  Limits for tasks assessed    Extremity/Trunk Assessment Upper Extremity Assessment Upper Extremity Assessment: Overall WFL for tasks assessed Lower Extremity Assessment Lower Extremity Assessment: RLE deficits/detail RLE Deficits / Details: MMT limited due to onset of R knee pain with resistance Cervical / Trunk Assessment Cervical / Trunk Assessment: Normal   Balance    End of Session PT - End of Session Activity Tolerance: Patient tolerated treatment well;Patient limited by pain Patient left: in chair;with call bell/phone within reach Nurse Communication: Mobility status (onset of chest pain with amb/stair negotiation)  GP Functional Assessment Tool Used: clinical judgement Functional Limitation: Mobility: Walking and moving around Mobility: Walking and Moving Around Current Status ([id number]): At least 1 percent but less than 20 percent impaired, limited or restricted Mobility: Walking and Moving Around Goal Status [phone]): At least 1 percent but less than 20 percent impaired, limited or restricted Mobility: Walking and Moving Around Discharge Status [phone]): At least 1 percent but less than 20 percent impaired, limited or restricted   Marcene Brawn 01/15/2013, 10:54 AM  Lewis Shock, PT, DPT Pager #: [phone] Office #: [phone]

## 2013-01-15 NOTE — Discharge Summary (Signed)
Patient seen and examined. Agree with above.  Leonia Reeves.D.

## 2013-01-15 NOTE — Progress Notes (Signed)
Pt ambulated in hallway and did stairs. C/O Chest pain 4 out of 1 to 10 a little diaphoretic. VS stable sats ok walked back to room chest pain subsided. Call to PA for [facility] to make aware of chest pain with exertion

## 2013-01-17 NOTE — ED Provider Notes (Signed)
Medical screening examination/treatment/procedure(s) were performed by non-physician practitioner and as supervising physician I was immediately available for consultation/collaboration.   Mark Joseph James, MD 01/17/13 1111 

## 2013-01-20 ENCOUNTER — Telehealth: Payer: Self-pay | Admitting: Internal Medicine

## 2013-01-20 NOTE — Telephone Encounter (Signed)
Walk In Pt Form " Medical Clearance For Dental Services" Peter Dunlap back on Tuesday( 8/19) Will hold Onto until she's back In Office 01/20/13/KM

## 2013-01-21 ENCOUNTER — Ambulatory Visit (HOSPITAL_COMMUNITY): Payer: Medicare Other | Attending: Cardiology | Admitting: Radiology

## 2013-01-21 VITALS — BP 104/74 | Ht 71.0 in | Wt 303.0 lb

## 2013-01-21 DIAGNOSIS — R42 Dizziness and giddiness: Secondary | ICD-10-CM | POA: Insufficient documentation

## 2013-01-21 DIAGNOSIS — I779 Disorder of arteries and arterioles, unspecified: Secondary | ICD-10-CM | POA: Insufficient documentation

## 2013-01-21 DIAGNOSIS — R0602 Shortness of breath: Secondary | ICD-10-CM | POA: Insufficient documentation

## 2013-01-21 DIAGNOSIS — R002 Palpitations: Secondary | ICD-10-CM | POA: Insufficient documentation

## 2013-01-21 DIAGNOSIS — I10 Essential (primary) hypertension: Secondary | ICD-10-CM | POA: Insufficient documentation

## 2013-01-21 DIAGNOSIS — E785 Hyperlipidemia, unspecified: Secondary | ICD-10-CM | POA: Insufficient documentation

## 2013-01-21 DIAGNOSIS — R079 Chest pain, unspecified: Secondary | ICD-10-CM | POA: Insufficient documentation

## 2013-01-21 DIAGNOSIS — I509 Heart failure, unspecified: Secondary | ICD-10-CM | POA: Insufficient documentation

## 2013-01-21 MED ORDER — TECHNETIUM TC 99M SESTAMIBI GENERIC - CARDIOLITE
30.0000 | Freq: Once | INTRAVENOUS | Status: AC | PRN
Start: 2013-01-21 — End: 2013-01-21
  Administered 2013-01-21: 30 via INTRAVENOUS

## 2013-01-21 MED ORDER — REGADENOSON 0.4 MG/5ML IV SOLN
0.4000 mg | Freq: Once | INTRAVENOUS | Status: AC
  Administered 2013-01-21: 0.4 mg via INTRAVENOUS

## 2013-01-21 NOTE — Progress Notes (Signed)
[facility] SITE 3 NUCLEAR MED [address] [place], [place] [address] [phone]    Cardiology Nuclear Med Study  [name] is a 57 y.o. male     MRN : [id number]     DOB: August 27, 1955  Procedure Date: 01/21/2013  Nuclear Med Background Indication for Stress Test:  Evaluation for Ischemia and Post Hospital:ER with Chest pain,CHF and (-)enzymes History:  '02 MPS: EF: 56% (-) ischemia '05 Ablation, '12 Heart Cath: EF: 65% mod CAD Tx Rx  8/14 ECHO: EF: 65-70% severe LVH, PSVT Cardiac Risk Factors: Carotid Disease, Hypertension and Lipids  Symptoms:  Chest Pain, Chest Pain with Exertion, Dizziness, Palpitations and SOB   Nuclear Pre-Procedure Caffeine/Decaff Intake:  None NPO After: 7:30pm   Lungs:  clear O2 Sat: 98% on room air. IV 0.9% NS with Angio Cath:  22g  IV Site: R Hand  IV Started by:  Cathlyn Parsons, RN  Chest Size (in):  52 Cup Size: n/a  Height: 5\' 11"  (1.803 m)  Weight:  303 lb (137.44 kg)  BMI:  Body mass index is 42.28 kg/(m^2). Tech Comments:  Toprol taken this am at 0700    Nuclear Med Study 1 or 2 day study: 2 day  Stress Test Type:  Eugenie Birks  Reading MD: Peter Millers, MD  Order Authorizing Provider:  Dorathy Daft  Resting Radionuclide: Technetium 59m Sestamibi  Resting Radionuclide Dose: 33.0 mCi on 01/23/13   Stress Radionuclide:  Technetium 76m Sestamibi  Stress Radionuclide Dose: 33.0 mCi on 01/21/13           Stress Protocol Rest HR: 72 Stress HR: 85  Rest BP: 104/74 Stress BP: 144/69  Exercise Time (min): n/a METS: n/a   Predicted Max HR: 163 bpm % Max HR: 52.15 bpm Rate Pressure Product: 13244   Dose of Adenosine (mg):  n/a Dose of Lexiscan: 0.4 mg  Dose of Atropine (mg): n/a Dose of Dobutamine: n/a mcg/kg/min (at max HR)  Stress Test Technologist: Milana Na, EMT-P  Nuclear Technologist:  Doyne Keel, CNMT     Rest Procedure:  Myocardial perfusion imaging was performed at rest 45 minutes following the  intravenous administration of Technetium 85m Sestamibi. Rest ECG: Sinus rhythm, LVH with repolarization abnormality, BAE, prolonged QT.  Stress Procedure:  The patient received IV Lexiscan 0.4 mg over 15-seconds.  Technetium 36m Sestamibi injected at 30-seconds.  Quantitative spect images were obtained after a 45 minute delay. Stress ECG: Uninteretable due to baseline LVH.  QPS Raw Data Images:  Acquisition technically good; LVE. Stress Images:  There is decreased uptake in the lateral wall. Rest Images:  There is decreased uptake in the lateral wall. Subtraction (SDS):  No evidence of ischemia. Transient Ischemic Dilatation (Normal <1.22):  n/a Lung/Heart Ratio (Normal <0.45):  0.43  Quantitative Gated Spect Images QGS EDV:  129 ml QGS ESV:  88 ml  Impression Exercise Capacity:  Lexiscan with no exercise. BP Response:  Normal blood pressure response. Clinical Symptoms:  There is dyspnea. ECG Impression:   EKG uninterpretable due to LVH. Comparison with Prior Nuclear Study: No images to compare  Overall Impression:  Low risk stress nuclear study with a small, mild intensity, lateral defect consistent with soft tissue attenuation; no ischemia; LV function visually appears better than calculated EF; suggest echocardiogram to better quantify.  LV Ejection Fraction: 32%.  LV Wall Motion:  Global hypokinesis.   Peter Dunlap

## 2013-01-23 ENCOUNTER — Ambulatory Visit (HOSPITAL_COMMUNITY): Payer: Medicare Other | Attending: Internal Medicine

## 2013-01-23 DIAGNOSIS — R0989 Other specified symptoms and signs involving the circulatory and respiratory systems: Secondary | ICD-10-CM

## 2013-01-23 MED ORDER — TECHNETIUM TC 99M SESTAMIBI GENERIC - CARDIOLITE
33.0000 | Freq: Once | INTRAVENOUS | Status: AC | PRN
  Administered 2013-01-23: 33 via INTRAVENOUS

## 2013-01-29 ENCOUNTER — Telehealth: Payer: Self-pay | Admitting: Internal Medicine

## 2013-01-29 NOTE — Telephone Encounter (Signed)
New problem ° ° °Pt want to know results of stress test. °

## 2013-01-31 ENCOUNTER — Emergency Department (HOSPITAL_COMMUNITY)
Admission: EM | Admit: 2013-01-31 | Discharge: 2013-01-31 | Disposition: A | Discharge status: Home / Self Care | Payer: Medicare Other | Attending: Emergency Medicine | Admitting: Emergency Medicine

## 2013-01-31 ENCOUNTER — Encounter (HOSPITAL_COMMUNITY): Payer: Self-pay

## 2013-01-31 ENCOUNTER — Emergency Department (HOSPITAL_COMMUNITY): Payer: Medicare Other

## 2013-01-31 DIAGNOSIS — Z7982 Long term (current) use of aspirin: Secondary | ICD-10-CM | POA: Insufficient documentation

## 2013-01-31 DIAGNOSIS — E669 Obesity, unspecified: Secondary | ICD-10-CM | POA: Insufficient documentation

## 2013-01-31 DIAGNOSIS — Z87891 Personal history of nicotine dependence: Secondary | ICD-10-CM | POA: Insufficient documentation

## 2013-01-31 DIAGNOSIS — Z9889 Other specified postprocedural states: Secondary | ICD-10-CM | POA: Insufficient documentation

## 2013-01-31 DIAGNOSIS — Z8719 Personal history of other diseases of the digestive system: Secondary | ICD-10-CM | POA: Insufficient documentation

## 2013-01-31 DIAGNOSIS — I252 Old myocardial infarction: Secondary | ICD-10-CM | POA: Insufficient documentation

## 2013-01-31 DIAGNOSIS — I10 Essential (primary) hypertension: Secondary | ICD-10-CM | POA: Insufficient documentation

## 2013-01-31 DIAGNOSIS — K219 Gastro-esophageal reflux disease without esophagitis: Secondary | ICD-10-CM | POA: Insufficient documentation

## 2013-01-31 DIAGNOSIS — Z87448 Personal history of other diseases of urinary system: Secondary | ICD-10-CM | POA: Insufficient documentation

## 2013-01-31 DIAGNOSIS — R0600 Dyspnea, unspecified: Secondary | ICD-10-CM

## 2013-01-31 DIAGNOSIS — Z7902 Long term (current) use of antithrombotics/antiplatelets: Secondary | ICD-10-CM | POA: Insufficient documentation

## 2013-01-31 DIAGNOSIS — Z8669 Personal history of other diseases of the nervous system and sense organs: Secondary | ICD-10-CM | POA: Insufficient documentation

## 2013-01-31 DIAGNOSIS — R0609 Other forms of dyspnea: Secondary | ICD-10-CM | POA: Insufficient documentation

## 2013-01-31 DIAGNOSIS — Z8739 Personal history of other diseases of the musculoskeletal system and connective tissue: Secondary | ICD-10-CM | POA: Insufficient documentation

## 2013-01-31 DIAGNOSIS — Z8673 Personal history of transient ischemic attack (TIA), and cerebral infarction without residual deficits: Secondary | ICD-10-CM | POA: Insufficient documentation

## 2013-01-31 DIAGNOSIS — Z79899 Other long term (current) drug therapy: Secondary | ICD-10-CM | POA: Insufficient documentation

## 2013-01-31 DIAGNOSIS — R0989 Other specified symptoms and signs involving the circulatory and respiratory systems: Secondary | ICD-10-CM | POA: Insufficient documentation

## 2013-01-31 DIAGNOSIS — F3289 Other specified depressive episodes: Secondary | ICD-10-CM | POA: Insufficient documentation

## 2013-01-31 DIAGNOSIS — I2510 Atherosclerotic heart disease of native coronary artery without angina pectoris: Secondary | ICD-10-CM | POA: Insufficient documentation

## 2013-01-31 DIAGNOSIS — E785 Hyperlipidemia, unspecified: Secondary | ICD-10-CM | POA: Insufficient documentation

## 2013-01-31 DIAGNOSIS — F329 Major depressive disorder, single episode, unspecified: Secondary | ICD-10-CM | POA: Insufficient documentation

## 2013-01-31 DIAGNOSIS — R0789 Other chest pain: Secondary | ICD-10-CM | POA: Insufficient documentation

## 2013-01-31 DIAGNOSIS — M129 Arthropathy, unspecified: Secondary | ICD-10-CM | POA: Insufficient documentation

## 2013-01-31 DIAGNOSIS — Z8679 Personal history of other diseases of the circulatory system: Secondary | ICD-10-CM | POA: Insufficient documentation

## 2013-01-31 DIAGNOSIS — R0602 Shortness of breath: Secondary | ICD-10-CM | POA: Insufficient documentation

## 2013-01-31 DIAGNOSIS — I509 Heart failure, unspecified: Secondary | ICD-10-CM | POA: Insufficient documentation

## 2013-01-31 DIAGNOSIS — R011 Cardiac murmur, unspecified: Secondary | ICD-10-CM | POA: Insufficient documentation

## 2013-01-31 LAB — CBC WITH DIFFERENTIAL/PLATELET
Basophils Absolute: 0.1 10*3/uL (ref 0.0–0.1)
Basophils Relative: 1 % (ref 0–1)
Eosinophils Absolute: 0.1 10*3/uL (ref 0.0–0.7)
Eosinophils Relative: 2 % (ref 0–5)
HCT: 43.8 % (ref 39.0–52.0)
Hemoglobin: 15.9 g/dL (ref 13.0–17.0)
Lymphocytes Relative: 33 % (ref 12–46)
Lymphs Abs: 1.8 10*3/uL (ref 0.7–4.0)
MCH: 32.7 pg (ref 26.0–34.0)
MCHC: 36.3 g/dL — ABNORMAL HIGH (ref 30.0–36.0)
MCV: 90.1 fL (ref 78.0–100.0)
Monocytes Absolute: 0.5 10*3/uL (ref 0.1–1.0)
Monocytes Relative: 9 % (ref 3–12)
Neutro Abs: 3.1 10*3/uL (ref 1.7–7.7)
Neutrophils Relative %: 55 % (ref 43–77)
Platelets: 197 10*3/uL (ref 150–400)
RBC: 4.86 MIL/uL (ref 4.22–5.81)
RDW: 13 % (ref 11.5–15.5)
WBC: 5.7 10*3/uL (ref 4.0–10.5)

## 2013-01-31 LAB — BASIC METABOLIC PANEL
BUN: 13 mg/dL (ref 6–23)
CO2: 22 mEq/L (ref 19–32)
Calcium: 8.9 mg/dL (ref 8.4–10.5)
Chloride: 102 mEq/L (ref 96–112)
Creatinine, Ser: 1.13 mg/dL (ref 0.50–1.35)
GFR calc Af Amer: 82 mL/min — ABNORMAL LOW (ref >90)
GFR calc non Af Amer: 70 mL/min — ABNORMAL LOW (ref >90)
Glucose, Bld: 105 mg/dL — ABNORMAL HIGH (ref 70–99)
Potassium: 3.9 mEq/L (ref 3.5–5.1)
Sodium: 136 mEq/L (ref 135–145)

## 2013-01-31 LAB — TROPONIN I: Troponin I: <0.3 ng/mL (ref <0.30)

## 2013-01-31 LAB — PRO B NATRIURETIC PEPTIDE: Pro B Natriuretic peptide (BNP): 448 pg/mL — ABNORMAL HIGH (ref 0–125)

## 2013-01-31 MED ORDER — VERAPAMIL HCL ER 180 MG PO TBCR: 180.0000 mg | EXTENDED_RELEASE_TABLET | Freq: Every day | ORAL | Status: DC

## 2013-01-31 NOTE — ED Provider Notes (Signed)
CSN: [id number]     Arrival date & time 01/31/13  0441 History   First MD Initiated Contact with Patient 01/31/13 0450     Chief Complaint  Patient presents with  . Chest Pain   (Consider location/radiation/quality/duration/timing/severity/associated sxs/prior Treatment) HPI 57 yo male presents to the ER from home via EMS with complaint of chest pain, sob this morning about 1-2 hours ago.  Pt reports he woke and went to the bathroom, and while in the bathroom he began have chest pain.  Pain was left sided and radiated into his left upper chest, arm.  This pain was different from his recent episodes of chest pain.  Pt was admitted 8/11 with chest pain, CHF exacerbation.  He had stress test last week, has not yet heard the results.  He denies missing any medications.  No cough, no fever.  CP is now gone, but still having some sob.  Past Medical History  Diagnosis Date  . Hypertension     Severe LVH with normal EF  . Arteriosclerotic cardiovascular disease (ASCVD) 2005    catheterization in 10/2010:50% mid LAD, diffuse distal disease, circumflex irregularities, large dominant RCA with a 50% ostial, 70% distal, 60% posterolateral and 70% PDA; normal EF  . Cerebrovascular disease 2010    R. carotid endarterectomy; Duplex in 10/2010-widely patent ICAs, subtotal left vertebral-not thought to be contributing to symptoms  . Hyperlipidemia   . Obesity   . Tobacco abuse     Quit 2014  . Benign prostatic hypertrophy   . Low back pain   . History of PSVT (paroxysmal supraventricular tachycardia) 2005    Diagnosed with ILR; no recurrence following RFA  . Cervical spine disease     CT in 2012-advanced degeneration and spondylosis with moderate spinal stenosis at C3-C6  . H/O: substance abuse     Cocaine, marijuana, alcohol.  Quit 2013.   [name] Syncope   . Gastroesophageal reflux disease   . Depression   . Erectile dysfunction   . Coronary artery disease   . Myocardial infarction   . Anginal pain   .  Shortness of breath   . CHF (congestive heart failure)   . H/O hiatal hernia   . Headache(784.0)   . Arthritis    Past Surgical History  Procedure Laterality Date  . Radiofrequency ablation  2005    for PSVT  . Carotid endarterectomy  2010    Right CEA  . Cardiac catheterization     Family History  Problem Relation Age of Onset  . Hypertension Mother     Cerebrovascular disease  . Diabetes Mother   . Coronary artery disease Father 52  . Diabetes type II Father   . Hypertension Father   . Lung cancer Paternal Uncle   . Diabetes Sister   . Hypertension Sister   . Heart attack Sister 60  . Diabetes Brother   . Hypertension Brother    History  Substance Use Topics  . Smoking status: Former Smoker -- 1.00 packs/day for 40 years    Types: Cigarettes  . Smokeless tobacco: Not on file     Comment: Quit in May.   . Alcohol Use: No    Review of Systems  All other systems reviewed and are negative.    Allergies  Lactose intolerance (gi)  Home Medications   Current Outpatient Rx  Name  Route  Sig  Dispense  Refill  . aspirin EC 81 MG tablet   Oral   Take 81 mg  by mouth daily.         . cloNIDine (CATAPRES) 0.1 MG tablet   Oral   Take 0.1 mg by mouth 2 (two) times daily.          . clopidogrel (PLAVIX) 75 MG tablet   Oral   Take 75 mg by mouth daily.          . cycloSPORINE (RESTASIS) 0.05 % ophthalmic emulsion   Both Eyes   Place 1 drop into both eyes 2 (two) times daily.         [name] dexlansoprazole (DEXILANT) 60 MG capsule   Oral   Take 60 mg by mouth daily.         [name] ibuprofen (ADVIL,MOTRIN) 800 MG tablet   Oral   Take 1 tablet (800 mg total) by mouth 3 (three) times daily.   30 tablet   0   . metoprolol succinate (TOPROL-XL) 25 MG 24 hr tablet   Oral   Take 1 tablet (25 mg total) by mouth daily.   30 tablet   4   . naproxen (NAPROSYN) 500 MG tablet   Oral   Take 500 mg by mouth 2 (two) times daily as needed (pain).         .  nitroGLYCERIN (NITROSTAT) 0.4 MG SL tablet   Sublingual   Place 0.4 mg under the tongue every 5 (five) minutes as needed for chest pain.         . simvastatin (ZOCOR) 40 MG tablet   Oral   Take 40 mg by mouth daily.          . tamsulosin (FLOMAX) 0.4 MG CAPS capsule   Oral   Take 0.4 mg by mouth daily.          BP 127/73  Pulse 107  Temp(Src) 98 F (36.7 C) (Oral)  Resp 18  SpO2 100% Physical Exam  Constitutional: He is oriented to person, place, and time. He appears well-developed and well-nourished.  HENT:  Head: Normocephalic and atraumatic.  Nose: Nose normal.  Mouth/Throat: Oropharynx is clear and moist.  Eyes: Conjunctivae and EOM are normal. Pupils are equal, round, and reactive to light.  Neck: Normal range of motion. Neck supple. No JVD present. No tracheal deviation present. No thyromegaly present.  Cardiovascular: Normal rate, regular rhythm and intact distal pulses.  Exam reveals no gallop and no friction rub.   Murmur heard. Pulmonary/Chest: Effort normal and breath sounds normal. No stridor. No respiratory distress. He has no wheezes. He has no rales. He exhibits no tenderness.  Abdominal: Soft. Bowel sounds are normal. He exhibits no distension and no mass. There is no tenderness. There is no rebound and no guarding.  Musculoskeletal: Normal range of motion. He exhibits no edema and no tenderness.  Lymphadenopathy:    He has no cervical adenopathy.  Neurological: He is alert and oriented to person, place, and time. He exhibits normal muscle tone. Coordination normal.  Skin: Skin is warm and dry. No rash noted. No erythema. No pallor.  Psychiatric: He has a normal mood and affect. His behavior is normal. Judgment and thought content normal.    ED Course  Procedures (including critical care time) Labs Review Labs Reviewed  CBC WITH DIFFERENTIAL - Abnormal; Notable for the following:    MCHC 36.3 (*)    All other components within normal limits  BASIC  METABOLIC PANEL - Abnormal; Notable for the following:    Glucose, Bld 105 (*)    GFR  calc non Af Amer 70 (*)    GFR calc Af Amer 82 (*)    All other components within normal limits  PRO B NATRIURETIC PEPTIDE - Abnormal; Notable for the following:    Pro B Natriuretic peptide (BNP) 448.0 (*)    All other components within normal limits  TROPONIN I   Imaging Review Dg Chest 2 View  01/31/2013   *RADIOLOGY REPORT*  Clinical Data: Chest pain  CHEST - 2 VIEW  Comparison: 01/13/2013  Findings: Heart size mildly enlarged.  Mediastinal contours otherwise within normal range.  No focal consolidation, pleural effusion, or pneumothorax. No overt edema.  No acute osseous finding.  IMPRESSION: Mild cardiomegaly.  No overt edema or focal consolidation.   Original Report Authenticated By: Jearld Lesch, M.D.    Date: 01/31/2013  Rate: 100  Rhythm: sinus tachycardia  QRS Axis: normal  Intervals: normal  ST/T Wave abnormalities: LVH  Conduction Disutrbances:none  Narrative Interpretation:   Old EKG Reviewed: unchanged   MDM   1. Atypical chest pain   2. Dyspnea    57 yo male with h/o htn, diffuse CAD, HLD, CHF with sob, chest pain this morning.  No ST elevation on EKG, LVH noted.  CP has resolved.  Will get labs.  Recent nuclear stress test without ischemic changes.  Will d/w cardiology.  D/w Corinda Gubler cards.  In light of recent negative w/u in patient and nuclear stress test outpatient, with no further findings on today's exam, will have patient f/u in clinic as scheduled.    Olivia Mackie, MD 01/31/13 [id number]

## 2013-01-31 NOTE — Telephone Encounter (Signed)
Spoke with patient  He is aware of both echo and stress test results.  He is going to stop Clonidine and start Verapamil 180mg  daily We will se him in 6 weeks.  Melissa will call and schedule

## 2013-01-31 NOTE — ED Notes (Signed)
Patient transported to X-ray 

## 2013-01-31 NOTE — ED Notes (Signed)
Pt here CP, recent admit for CHF and hx of LVH, with ems 12 lead shows changes. Woke up today with sob and left cp, into left shoulder, increased with movement and lying flat, with ems lungs clear

## 2013-02-04 ENCOUNTER — Ambulatory Visit: Payer: Medicare Other | Admitting: Cardiology

## 2013-02-21 ENCOUNTER — Telehealth: Payer: Self-pay | Admitting: Internal Medicine

## 2013-02-21 NOTE — Telephone Encounter (Signed)
Medical records re faxed paperwork to dentist/ see scan. Pt was informed.

## 2013-02-21 NOTE — Telephone Encounter (Signed)
Pt is calling in to find out what the status is about some paperwork he previously dropped off for Dr [name] to fill out for his upcoming dentist appt. Pt states he needs the paperwork filled out by his cardiologist for an upcoming dental procedure. Pt would like Kelly/Dr [name] to fax his paperwork  [facility] Dentist fax [phone].[name] Phone [phone].[name] He would also like the nurse to call him on the status of the document.

## 2013-05-21 ENCOUNTER — Other Ambulatory Visit: Payer: Self-pay | Admitting: Internal Medicine

## 2013-06-05 HISTORY — PX: BRAIN SURGERY: SHX531

## 2013-07-15 ENCOUNTER — Other Ambulatory Visit: Payer: Self-pay | Admitting: Vascular Surgery

## 2013-07-15 DIAGNOSIS — Z48812 Encounter for surgical aftercare following surgery on the circulatory system: Secondary | ICD-10-CM

## 2013-07-15 DIAGNOSIS — I6529 Occlusion and stenosis of unspecified carotid artery: Secondary | ICD-10-CM

## 2013-08-21 ENCOUNTER — Other Ambulatory Visit (HOSPITAL_COMMUNITY): Payer: Self-pay | Admitting: Preventative Medicine

## 2013-08-21 ENCOUNTER — Ambulatory Visit (HOSPITAL_COMMUNITY)
Admission: RE | Admit: 2013-08-21 | Discharge: 2013-08-21 | Disposition: A | Discharge status: Home / Self Care | Payer: Medicare Other | Source: Ambulatory Visit | Attending: Preventative Medicine | Admitting: Preventative Medicine

## 2013-08-21 DIAGNOSIS — R52 Pain, unspecified: Secondary | ICD-10-CM

## 2013-08-21 DIAGNOSIS — R1013 Epigastric pain: Secondary | ICD-10-CM | POA: Insufficient documentation

## 2013-09-09 ENCOUNTER — Other Ambulatory Visit: Payer: Self-pay | Admitting: Gastroenterology

## 2013-09-09 DIAGNOSIS — R1310 Dysphagia, unspecified: Secondary | ICD-10-CM

## 2013-09-15 ENCOUNTER — Ambulatory Visit
Admission: RE | Admit: 2013-09-15 | Discharge: 2013-09-15 | Disposition: A | Discharge status: Home / Self Care | Payer: Medicare Other | Source: Ambulatory Visit | Attending: Gastroenterology | Admitting: Gastroenterology

## 2013-09-15 DIAGNOSIS — R1310 Dysphagia, unspecified: Secondary | ICD-10-CM

## 2013-11-04 ENCOUNTER — Other Ambulatory Visit (HOSPITAL_COMMUNITY): Payer: Medicare Other

## 2013-11-04 ENCOUNTER — Ambulatory Visit: Payer: Medicare Other | Admitting: Vascular Surgery

## 2013-11-12 ENCOUNTER — Encounter: Payer: Self-pay | Admitting: Family

## 2013-11-13 ENCOUNTER — Ambulatory Visit (INDEPENDENT_AMBULATORY_CARE_PROVIDER_SITE_OTHER): Payer: 59 | Admitting: Family

## 2013-11-13 ENCOUNTER — Encounter: Payer: Self-pay | Admitting: Family

## 2013-11-13 ENCOUNTER — Ambulatory Visit (HOSPITAL_COMMUNITY)
Admission: RE | Admit: 2013-11-13 | Discharge: 2013-11-13 | Disposition: A | Discharge status: Home / Self Care | Payer: PRIVATE HEALTH INSURANCE | Source: Ambulatory Visit | Attending: Family | Admitting: Family

## 2013-11-13 VITALS — BP 111/72 | HR 69 | Resp 16 | Ht 71.5 in | Wt 326.0 lb

## 2013-11-13 DIAGNOSIS — Z48812 Encounter for surgical aftercare following surgery on the circulatory system: Secondary | ICD-10-CM | POA: Diagnosis not present

## 2013-11-13 DIAGNOSIS — I6529 Occlusion and stenosis of unspecified carotid artery: Secondary | ICD-10-CM | POA: Insufficient documentation

## 2013-11-13 NOTE — Progress Notes (Signed)
Established Carotid Patient   History of Present Illness  Peter Dunlap is a 58 y.o. male patient of Dr. Donnetta Hutching seen for annual carotid duplex status post right CEA in February 2010.   Patient has Negative history of TIA or stroke symptom.  The patient denies amaurosis fugax or monocular blindness.  The patient  denies facial drooping.  Pt. denies hemiplegia.  The patient denies receptive or expressive aphasia.  Pt. denies extremity weakness. Pt denies claudication symptoms with walking, denies non healing wounds. He reports "bulging discs" in his low back, has occasional numbness at anterior thigh. He states that his heart rhythm remains irregular and that his cardiologist is aware.  He has had 2 MI's, last one approximately 2009.   Pt reports New Medical or Surgical History: hospitalized at [facility] end of 2014 for CHF.  Pt Diabetic: No Pt smoker: former smoker, quit in 2014  Pt meds include: Statin : Yes ASA: Yes Other anticoagulants/antiplatelets: Plavix   Past Medical History  Diagnosis Date  . Hypertension     Severe LVH with normal EF  . Arteriosclerotic cardiovascular disease (ASCVD) 2005    catheterization in 10/2010:50% mid LAD, diffuse distal disease, circumflex irregularities, large dominant RCA with a 50% ostial, 70% distal, 60% posterolateral and 70% PDA; normal EF  . Cerebrovascular disease 2010    R. carotid endarterectomy; Duplex in 10/2010-widely patent ICAs, subtotal left vertebral-not thought to be contributing to symptoms  . Hyperlipidemia   . Obesity   . Tobacco abuse     Quit 2014  . Benign prostatic hypertrophy   . Low back pain   . History of PSVT (paroxysmal supraventricular tachycardia) 2005    Diagnosed with ILR; no recurrence following RFA  . Cervical spine disease     CT in 2012-advanced degeneration and spondylosis with moderate spinal stenosis at C3-C6  . H/O: substance abuse     Cocaine, marijuana, alcohol.  Quit 2013.   [name] Syncope   .  Gastroesophageal reflux disease   . Depression   . Erectile dysfunction   . Coronary artery disease   . Myocardial infarction   . Anginal pain   . Shortness of breath   . CHF (congestive heart failure)   . H/O hiatal hernia   . Headache(784.0)   . Arthritis   . Carotid artery occlusion     Social History History  Substance Use Topics  . Smoking status: Former Smoker -- 1.00 packs/day for 40 years    Types: Cigarettes    Quit date: 10/10/2012  . Smokeless tobacco: Never Used     Comment: Quit in May.   . Alcohol Use: No    Family History Family History  Problem Relation Age of Onset  . Hypertension Mother     Cerebrovascular disease  . Diabetes Mother   . Coronary artery disease Father 52  . Diabetes type II Father   . Hypertension Father   . Heart attack Father   . Lung cancer Paternal Uncle   . Diabetes Sister   . Hypertension Sister   . Heart attack Sister 39  . Diabetes Brother   . Hypertension Brother     Surgical History Past Surgical History  Procedure Laterality Date  . Radiofrequency ablation  2005    for PSVT  . Cardiac catheterization    . Carotid endarterectomy Right Feb. 25, 2010     CEA    Allergies  Allergen Reactions  . Lactose Intolerance (Gi)     Current Outpatient  Prescriptions  Medication Sig Dispense Refill  . aspirin EC 81 MG tablet Take 81 mg by mouth daily.      . clopidogrel (PLAVIX) 75 MG tablet Take 75 mg by mouth daily.       . cycloSPORINE (RESTASIS) 0.05 % ophthalmic emulsion Place 1 drop into both eyes 2 (two) times daily.      [name] dexlansoprazole (DEXILANT) 60 MG capsule Take 60 mg by mouth daily.      . hydrochlorothiazide (MICROZIDE) 12.5 MG capsule       . ibuprofen (ADVIL,MOTRIN) 800 MG tablet Take 1 tablet (800 mg total) by mouth 3 (three) times daily.  30 tablet  0  . metoprolol succinate (TOPROL-XL) 25 MG 24 hr tablet Take 1 tablet (25 mg total) by mouth daily.  30 tablet  4  . naproxen (NAPROSYN) 500 MG tablet  Take 500 mg by mouth 2 (two) times daily as needed (pain).      [name] NITROSTAT 0.4 MG SL tablet PLACE 1 TABLET UNDER TONGUE EVERY 5 MINUTES AS NEEDED FOR CHEST PAIN-DOCTOR CALL  25 tablet  0  . RAPAFLO 8 MG CAPS capsule daily.      . simvastatin (ZOCOR) 40 MG tablet Take 40 mg by mouth daily.       . verapamil (CALAN-SR) 180 MG CR tablet Take 1 tablet (180 mg total) by mouth at bedtime.  30 tablet  11  . tamsulosin (FLOMAX) 0.4 MG CAPS capsule Take 0.4 mg by mouth daily.       No current facility-administered medications for this visit.    Review of Systems : See HPI for pertinent positives and negatives.  Physical Examination   Filed Vitals:   11/13/13 1524 11/13/13 1527  BP: 124/78 111/72  Pulse: 71 69  Resp:  16  Height:  5' 11.5" (1.816 m)  Weight:  326 lb (147.873 kg)  SpO2:  97%   Body mass index is 44.84 kg/(m^2).  General: WDWN morbidly obese male in NAD GAIT: normal Eyes: PERRLA Pulmonary:  Non-labored, CTAB, Negative  Rales, Negative rhonchi, & Negative wheezing.  Cardiac: irregular Rhythm   VASCULAR EXAM Carotid Bruits Left Right   Negative Negative    Aorta is not palpable. Radial pulses are 2+ palpable and equal.                                                                                                                        Gastrointestinal: soft, nontender, BS WNL, no r/g,  negative masses.  Musculoskeletal: Negative muscle atrophy/wasting. M/S 5/5 throughout, Extremities without ischemic changes.  Neurologic: A&O X 3; Appropriate Affect ; SENSATION ;normal;  Speech is normal CN 2-12 intact, Pain and light touch intact in extremities, Motor exam as listed above.   Non-Invasive Vascular Imaging CAROTID DUPLEX 11/13/2013   CEREBROVASCULAR DUPLEX EVALUATION    INDICATION: Carotid stenosis    PREVIOUS INTERVENTION(S): Right carotid endarterectomy 07/30/2008.    DUPLEX EXAM:     RIGHT  LEFT  Peak Systolic Velocities (cm/s) End Diastolic  Velocities (cm/s) Plaque LOCATION Peak Systolic Velocities (cm/s) End Diastolic Velocities (cm/s) Plaque  119 17  CCA PROXIMAL 166 20   101 17  CCA MID 68 14   68 17  CCA DISTAL 83 18 HT  118 11  ECA 202 26 HT  50 13  ICA PROXIMAL 92 28 HT  96 29  ICA MID 99 39 HT  105 31  ICA DISTAL 100 38     carotid endarterectomy ICA / CCA Ratio (PSV)   Antegrade Vertebral Flow Antegrade/dampened  354 Brachial Systolic Pressure (mmHg) 656  Triphasic Brachial Artery Waveforms Triphasic    Plaque Morphology:  HM = Homogeneous, HT = Heterogeneous, CP = Calcific Plaque, SP = Smooth Plaque, IP = Irregular Plaque     ADDITIONAL FINDINGS:     IMPRESSION: Right internal carotid artery is patent with history of carotid endarterectomy, no hyperplasia or hemodynamically significant changes present. Left internal carotid artery stenosis present of less than 40%. Left external carotid artery stenosis. Right vertebral artery is antegrade.  Left vertebral artery is dampened and blunted which may be suggestive of a more proximal disease process.    Compared to the previous exam:  Essentially unchanged since study on 10/25/2012.    Assessment: Jeffry Vogelsang is a 58 y.o. male who presents with asymptomatic right ICA (endarterectomy site), less than 40% left ICA stenosis. Essentially unchanged since study on 10/25/2012.  Plan: Follow-up in 1 year with Carotid Duplex scan.   I discussed in depth with the patient the nature of atherosclerosis, and emphasized the importance of maximal medical management including strict control of blood pressure, blood glucose, and lipid levels, obtaining regular exercise, and continued cessation of smoking.  The patient is aware that without maximal medical management the underlying atherosclerotic disease process will progress, limiting the benefit of any interventions. The patient was given information about stroke prevention and what symptoms should prompt the patient to seek  immediate medical care. Thank you for allowing [place] to participate in this patient's care.  Clemon Chambers, RN, MSN, FNP-C [facility] Office: [phone]  Clinic Physician: Oneida Alar  11/13/2013 3:39 PM

## 2013-11-13 NOTE — Patient Instructions (Signed)

## 2013-12-02 ENCOUNTER — Emergency Department (HOSPITAL_COMMUNITY): Payer: PRIVATE HEALTH INSURANCE

## 2013-12-02 ENCOUNTER — Inpatient Hospital Stay (HOSPITAL_COMMUNITY)
Admission: EM | Admit: 2013-12-02 | Discharge: 2013-12-05 | DRG: 247 | Disposition: A | Discharge status: Home / Self Care | Payer: PRIVATE HEALTH INSURANCE | Attending: Internal Medicine | Admitting: Internal Medicine

## 2013-12-02 ENCOUNTER — Other Ambulatory Visit: Payer: Self-pay

## 2013-12-02 ENCOUNTER — Encounter (HOSPITAL_COMMUNITY): Payer: Self-pay | Admitting: Emergency Medicine

## 2013-12-02 DIAGNOSIS — Z8249 Family history of ischemic heart disease and other diseases of the circulatory system: Secondary | ICD-10-CM | POA: Diagnosis not present

## 2013-12-02 DIAGNOSIS — I214 Non-ST elevation (NSTEMI) myocardial infarction: Principal | ICD-10-CM | POA: Diagnosis present

## 2013-12-02 DIAGNOSIS — N400 Enlarged prostate without lower urinary tract symptoms: Secondary | ICD-10-CM | POA: Diagnosis present

## 2013-12-02 DIAGNOSIS — E1169 Type 2 diabetes mellitus with other specified complication: Secondary | ICD-10-CM

## 2013-12-02 DIAGNOSIS — I110 Hypertensive heart disease with heart failure: Secondary | ICD-10-CM | POA: Diagnosis present

## 2013-12-02 DIAGNOSIS — R7303 Prediabetes: Secondary | ICD-10-CM

## 2013-12-02 DIAGNOSIS — I10 Essential (primary) hypertension: Secondary | ICD-10-CM

## 2013-12-02 DIAGNOSIS — I471 Supraventricular tachycardia, unspecified: Secondary | ICD-10-CM | POA: Diagnosis present

## 2013-12-02 DIAGNOSIS — E66813 Obesity, class 3: Secondary | ICD-10-CM | POA: Diagnosis present

## 2013-12-02 DIAGNOSIS — Z833 Family history of diabetes mellitus: Secondary | ICD-10-CM

## 2013-12-02 DIAGNOSIS — G4730 Sleep apnea, unspecified: Secondary | ICD-10-CM | POA: Diagnosis present

## 2013-12-02 DIAGNOSIS — I4891 Unspecified atrial fibrillation: Secondary | ICD-10-CM | POA: Diagnosis present

## 2013-12-02 DIAGNOSIS — Z955 Presence of coronary angioplasty implant and graft: Secondary | ICD-10-CM

## 2013-12-02 DIAGNOSIS — I2510 Atherosclerotic heart disease of native coronary artery without angina pectoris: Secondary | ICD-10-CM | POA: Diagnosis present

## 2013-12-02 DIAGNOSIS — E669 Obesity, unspecified: Secondary | ICD-10-CM | POA: Diagnosis present

## 2013-12-02 DIAGNOSIS — E782 Mixed hyperlipidemia: Secondary | ICD-10-CM | POA: Diagnosis present

## 2013-12-02 DIAGNOSIS — Z801 Family history of malignant neoplasm of trachea, bronchus and lung: Secondary | ICD-10-CM

## 2013-12-02 DIAGNOSIS — K219 Gastro-esophageal reflux disease without esophagitis: Secondary | ICD-10-CM | POA: Diagnosis present

## 2013-12-02 DIAGNOSIS — I3139 Other pericardial effusion (noninflammatory): Secondary | ICD-10-CM | POA: Diagnosis present

## 2013-12-02 DIAGNOSIS — I679 Cerebrovascular disease, unspecified: Secondary | ICD-10-CM

## 2013-12-02 DIAGNOSIS — E785 Hyperlipidemia, unspecified: Secondary | ICD-10-CM

## 2013-12-02 DIAGNOSIS — E1149 Type 2 diabetes mellitus with other diabetic neurological complication: Secondary | ICD-10-CM | POA: Diagnosis present

## 2013-12-02 DIAGNOSIS — Z7902 Long term (current) use of antithrombotics/antiplatelets: Secondary | ICD-10-CM | POA: Diagnosis not present

## 2013-12-02 DIAGNOSIS — I422 Other hypertrophic cardiomyopathy: Secondary | ICD-10-CM | POA: Diagnosis present

## 2013-12-02 DIAGNOSIS — I319 Disease of pericardium, unspecified: Secondary | ICD-10-CM

## 2013-12-02 DIAGNOSIS — Z7982 Long term (current) use of aspirin: Secondary | ICD-10-CM | POA: Diagnosis not present

## 2013-12-02 DIAGNOSIS — I313 Pericardial effusion (noninflammatory): Secondary | ICD-10-CM

## 2013-12-02 DIAGNOSIS — Z87891 Personal history of nicotine dependence: Secondary | ICD-10-CM | POA: Diagnosis not present

## 2013-12-02 DIAGNOSIS — I480 Paroxysmal atrial fibrillation: Secondary | ICD-10-CM

## 2013-12-02 DIAGNOSIS — I509 Heart failure, unspecified: Secondary | ICD-10-CM

## 2013-12-02 HISTORY — DX: Non-ST elevation (NSTEMI) myocardial infarction: I21.4

## 2013-12-02 LAB — BASIC METABOLIC PANEL
BUN: 13 mg/dL (ref 6–23)
CO2: 19 mEq/L (ref 19–32)
Calcium: 9.2 mg/dL (ref 8.4–10.5)
Chloride: 100 mEq/L (ref 96–112)
Creatinine, Ser: 1.15 mg/dL (ref 0.50–1.35)
GFR calc Af Amer: 79 mL/min — ABNORMAL LOW (ref >90)
GFR calc non Af Amer: 68 mL/min — ABNORMAL LOW (ref >90)
Glucose, Bld: 159 mg/dL — ABNORMAL HIGH (ref 70–99)
Potassium: 3.7 mEq/L (ref 3.7–5.3)
Sodium: 138 mEq/L (ref 137–147)

## 2013-12-02 LAB — CBC
HCT: 49.1 % (ref 39.0–52.0)
Hemoglobin: 17.3 g/dL — ABNORMAL HIGH (ref 13.0–17.0)
MCH: 32.2 pg (ref 26.0–34.0)
MCHC: 35.2 g/dL (ref 30.0–36.0)
MCV: 91.3 fL (ref 78.0–100.0)
Platelets: 196 10*3/uL (ref 150–400)
RBC: 5.38 MIL/uL (ref 4.22–5.81)
RDW: 12.9 % (ref 11.5–15.5)
WBC: 11.8 10*3/uL — ABNORMAL HIGH (ref 4.0–10.5)

## 2013-12-02 LAB — TROPONIN I
Troponin I: 0.4 ng/mL (ref <0.30)
Troponin I: 0.48 ng/mL (ref <0.30)
Troponin I: 0.41 ng/mL (ref <0.30)
Troponin I: <0.3 ng/mL (ref <0.30)
Troponin I: <0.3 ng/mL (ref <0.30)

## 2013-12-02 LAB — PROTIME-INR
INR: 1.2 (ref 0.00–1.49)
Prothrombin Time: 15.2 seconds (ref 11.6–15.2)

## 2013-12-02 LAB — HEMOGLOBIN A1C
Hgb A1c MFr Bld: 6.4 % — ABNORMAL HIGH (ref <5.7)
Mean Plasma Glucose: 137 mg/dL — ABNORMAL HIGH (ref <117)

## 2013-12-02 LAB — HEPARIN LEVEL (UNFRACTIONATED)
Heparin Unfractionated: 0.22 IU/mL — ABNORMAL LOW (ref 0.30–0.70)
Heparin Unfractionated: 0.37 IU/mL (ref 0.30–0.70)

## 2013-12-02 LAB — PRO B NATRIURETIC PEPTIDE: Pro B Natriuretic peptide (BNP): 955.4 pg/mL — ABNORMAL HIGH (ref 0–125)

## 2013-12-02 MED ORDER — HEPARIN BOLUS VIA INFUSION
4000.0000 [IU] | Freq: Once | INTRAVENOUS | Status: AC
  Administered 2013-12-02: 4000 [IU] via INTRAVENOUS
  Filled 2013-12-02: qty 4000

## 2013-12-02 MED ORDER — SIMVASTATIN 40 MG PO TABS
40.0000 mg | ORAL_TABLET | Freq: Every day | ORAL | Status: DC
  Administered 2013-12-02 – 2013-12-04 (×3): 40 mg via ORAL
  Filled 2013-12-02 (×4): qty 1

## 2013-12-02 MED ORDER — ONDANSETRON HCL 4 MG/2ML IJ SOLN: 4.0000 mg | Freq: Four times a day (QID) | INTRAMUSCULAR | Status: DC | PRN

## 2013-12-02 MED ORDER — ACETAMINOPHEN 325 MG PO TABS: 650.0000 mg | ORAL_TABLET | ORAL | Status: DC | PRN

## 2013-12-02 MED ORDER — ASPIRIN 81 MG PO CHEW: 81.0000 mg | CHEWABLE_TABLET | ORAL | Status: DC

## 2013-12-02 MED ORDER — SODIUM CHLORIDE 0.9 % IV SOLN: 250.0000 mL | INTRAVENOUS | Status: DC | PRN

## 2013-12-02 MED ORDER — HEPARIN (PORCINE) IN NACL 100-0.45 UNIT/ML-% IJ SOLN
1700.0000 [IU]/h | INTRAMUSCULAR | Status: DC
  Administered 2013-12-02 (×2): 1700 [IU]/h via INTRAVENOUS
  Administered 2013-12-02: 1400 [IU]/h via INTRAVENOUS
  Administered 2013-12-03: 1700 [IU]/h via INTRAVENOUS
  Filled 2013-12-02 (×4): qty 250

## 2013-12-02 MED ORDER — CLOPIDOGREL BISULFATE 75 MG PO TABS
75.0000 mg | ORAL_TABLET | Freq: Every day | ORAL | Status: DC
  Administered 2013-12-02 – 2013-12-05 (×4): 75 mg via ORAL
  Filled 2013-12-02 (×4): qty 1

## 2013-12-02 MED ORDER — HYDROCHLOROTHIAZIDE 12.5 MG PO CAPS
12.5000 mg | ORAL_CAPSULE | Freq: Every day | ORAL | Status: DC
  Administered 2013-12-02 – 2013-12-03 (×2): 12.5 mg via ORAL
  Filled 2013-12-02 (×3): qty 1

## 2013-12-02 MED ORDER — SODIUM CHLORIDE 0.9 % IJ SOLN: 3.0000 mL | INTRAMUSCULAR | Status: DC | PRN

## 2013-12-02 MED ORDER — ALUM & MAG HYDROXIDE-SIMETH 200-200-20 MG/5ML PO SUSP
30.0000 mL | ORAL | Status: DC | PRN
  Administered 2013-12-02 – 2013-12-03 (×2): 30 mL via ORAL
  Filled 2013-12-02 (×2): qty 30

## 2013-12-02 MED ORDER — NITROGLYCERIN 2 % TD OINT
1.0000 [in_us] | TOPICAL_OINTMENT | Freq: Four times a day (QID) | TRANSDERMAL | Status: DC
  Administered 2013-12-02: 1 [in_us] via TOPICAL
  Filled 2013-12-02: qty 1

## 2013-12-02 MED ORDER — TAMSULOSIN HCL 0.4 MG PO CAPS
0.4000 mg | ORAL_CAPSULE | Freq: Every day | ORAL | Status: DC
  Administered 2013-12-02 – 2013-12-05 (×4): 0.4 mg via ORAL
  Filled 2013-12-02 (×4): qty 1

## 2013-12-02 MED ORDER — PANTOPRAZOLE SODIUM 40 MG PO TBEC
40.0000 mg | DELAYED_RELEASE_TABLET | Freq: Every day | ORAL | Status: DC
  Administered 2013-12-02 – 2013-12-05 (×4): 40 mg via ORAL
  Filled 2013-12-02 (×4): qty 1

## 2013-12-02 MED ORDER — ASPIRIN 81 MG PO CHEW
CHEWABLE_TABLET | ORAL | Status: AC
  Filled 2013-12-02: qty 4

## 2013-12-02 MED ORDER — ASPIRIN 81 MG PO CHEW
324.0000 mg | CHEWABLE_TABLET | Freq: Once | ORAL | Status: AC
  Administered 2013-12-02: 324 mg via ORAL

## 2013-12-02 MED ORDER — NITROGLYCERIN 0.4 MG SL SUBL: 0.4000 mg | SUBLINGUAL_TABLET | SUBLINGUAL | Status: DC | PRN

## 2013-12-02 MED ORDER — TAMSULOSIN HCL 0.4 MG PO CAPS: 0.4000 mg | ORAL_CAPSULE | Freq: Every day | ORAL | Status: DC

## 2013-12-02 MED ORDER — ASPIRIN EC 81 MG PO TBEC
81.0000 mg | DELAYED_RELEASE_TABLET | Freq: Every day | ORAL | Status: DC
  Administered 2013-12-03 – 2013-12-05 (×3): 81 mg via ORAL
  Filled 2013-12-02 (×4): qty 1

## 2013-12-02 MED ORDER — HEPARIN BOLUS VIA INFUSION
2000.0000 [IU] | Freq: Once | INTRAVENOUS | Status: AC
  Administered 2013-12-02: 2000 [IU] via INTRAVENOUS
  Filled 2013-12-02: qty 2000

## 2013-12-02 MED ORDER — VERAPAMIL HCL ER 180 MG PO TBCR
180.0000 mg | EXTENDED_RELEASE_TABLET | Freq: Every day | ORAL | Status: DC
  Administered 2013-12-02 – 2013-12-04 (×3): 180 mg via ORAL
  Filled 2013-12-02 (×4): qty 1

## 2013-12-02 MED ORDER — SODIUM CHLORIDE 0.9 % IJ SOLN
3.0000 mL | Freq: Two times a day (BID) | INTRAMUSCULAR | Status: DC
  Administered 2013-12-02 – 2013-12-03 (×2): 3 mL via INTRAVENOUS

## 2013-12-02 MED ORDER — METOPROLOL SUCCINATE ER 25 MG PO TB24
25.0000 mg | ORAL_TABLET | Freq: Every day | ORAL | Status: DC
  Administered 2013-12-02 – 2013-12-03 (×2): 25 mg via ORAL
  Filled 2013-12-02 (×3): qty 1

## 2013-12-02 MED ORDER — CYCLOSPORINE 0.05 % OP EMUL
1.0000 [drp] | Freq: Two times a day (BID) | OPHTHALMIC | Status: DC
  Administered 2013-12-02 – 2013-12-04 (×6): 1 [drp] via OPHTHALMIC
  Administered 2013-12-05: 10:00:00 via OPHTHALMIC
  Filled 2013-12-02 (×8): qty 1

## 2013-12-02 NOTE — Progress Notes (Signed)
Admission to 2w36:  Patient arrived to 2w38 @ approximately 0630 from ED via stretcher and stood to get in bed with minimal assistance.  Patient  Alert and Oriented X 4.  Atrial Fibrillation noted on tele when connected.  PIV #20 in right forearm infusing NS at [facility] and Heparin gtt at 1400 Units per hour.  2L [place] continued.  VSS.  Patient was in no apparent distress.    Angelica Davis,RN

## 2013-12-02 NOTE — Progress Notes (Signed)
  Echocardiogram 2D Echocardiogram has been performed.  GREGORY, ANGELA 12/02/2013, 10:34 AM

## 2013-12-02 NOTE — Progress Notes (Signed)
ANTICOAGULATION CONSULT NOTE - Initial Consult  Pharmacy Consult for Heparin  Indication: chest pain/ACS  Patient Measurements: ~148 kg  Vital Signs: Temp: 98.5 F (36.9 C) (06/30 0022) Temp src: Oral (06/30 0022) BP: 105/65 mmHg (06/30 0200) Pulse Rate: 35 (06/30 0200)  Labs:  Recent Labs  12/02/13 0037 12/02/13 0038  HGB 17.3*  --   HCT 49.1  --   PLT 196  --   CREATININE 1.15  --   TROPONINI  --  0.40*   Medical History: Past Medical History  Diagnosis Date  . Hypertension     Severe LVH with normal EF  . Arteriosclerotic cardiovascular disease (ASCVD) 2005    catheterization in 10/2010:50% mid LAD, diffuse distal disease, circumflex irregularities, large dominant RCA with a 50% ostial, 70% distal, 60% posterolateral and 70% PDA; normal EF  . Cerebrovascular disease 2010    R. carotid endarterectomy; Duplex in 10/2010-widely patent ICAs, subtotal left vertebral-not thought to be contributing to symptoms  . Hyperlipidemia   . Obesity   . Tobacco abuse     Quit 2014  . Benign prostatic hypertrophy   . Low back pain   . History of PSVT (paroxysmal supraventricular tachycardia) 2005    Diagnosed with ILR; no recurrence following RFA  . Cervical spine disease     CT in 2012-advanced degeneration and spondylosis with moderate spinal stenosis at C3-C6  . H/O: substance abuse     Cocaine, marijuana, alcohol.  Quit 2013.   [name] Syncope   . Gastroesophageal reflux disease   . Depression   . Erectile dysfunction   . Coronary artery disease   . Myocardial infarction   . Anginal pain   . Shortness of breath   . CHF (congestive heart failure)   . H/O hiatal hernia   . Headache(784.0)   . Arthritis   . Carotid artery occlusion     Assessment: 58 y/o M to start heparin for elevated troponin. CBC/renal function good, other labs as above.   Goal of Therapy:  Heparin level 0.3-0.7 units/ml Monitor platelets by anticoagulation protocol: Yes   Plan:  -Heparin 4000  units BOLUS x 1 -Start heparin drip at 1400 units/hr -1200 HL  -Daily CBC/HL -Monitor for bleeding  [name] 12/02/2013,2:43 AM

## 2013-12-02 NOTE — Progress Notes (Signed)
ANTICOAGULATION CONSULT NOTE  Pharmacy Consult for Heparin  Indication: chest pain/ACS  Patient Measurements: 146.5 kg  Vital Signs: Temp: 98 F (36.7 C) (06/30 1359) Temp src: Tympanic (06/30 1359) BP: 86/77 mmHg (06/30 1359) Pulse Rate: 77 (06/30 1359)  Labs:  Recent Labs  12/02/13 0037  12/02/13 0415 12/02/13 0845 12/02/13 1236  HGB 17.3*  --   --   --   --   HCT 49.1  --   --   --   --   PLT 196  --   --   --   --   LABPROT  --   --   --   --  15.2  INR  --   --   --   --  1.20  HEPARINUNFRC  --   --   --   --  0.22*  CREATININE 1.15  --   --   --   --   TROPONINI  --   < > 0.48* 0.41* <0.30  < > = values in this interval not displayed. Medical History: Past Medical History  Diagnosis Date  . Hypertension     Severe LVH with normal EF  . Arteriosclerotic cardiovascular disease (ASCVD) 2005    catheterization in 10/2010:50% mid LAD, diffuse distal disease, circumflex irregularities, large dominant RCA with a 50% ostial, 70% distal, 60% posterolateral and 70% PDA; normal EF  . Cerebrovascular disease 2010    R. carotid endarterectomy; Duplex in 10/2010-widely patent ICAs, subtotal left vertebral-not thought to be contributing to symptoms  . Hyperlipidemia   . Obesity   . Tobacco abuse     Quit 2014  . Benign prostatic hypertrophy   . Low back pain   . History of PSVT (paroxysmal supraventricular tachycardia) 2005    Diagnosed with ILR; no recurrence following RFA  . Cervical spine disease     CT in 2012-advanced degeneration and spondylosis with moderate spinal stenosis at C3-C6  . H/O: substance abuse     Cocaine, marijuana, alcohol.  Quit 2013.   [name] Syncope   . Gastroesophageal reflux disease   . Depression   . Erectile dysfunction   . Coronary artery disease   . Myocardial infarction   . Anginal pain   . Shortness of breath   . CHF (congestive heart failure)   . H/O hiatal hernia   . Headache(784.0)   . Arthritis   . Carotid artery occlusion      Assessment: 58 y/o M on heparin for elevated troponin/NSTEMI/Afib.  Now in NSR ChadsVasc score is 2.  First HL after 4000 unit bolus and drip at 1400 units/hr is below goal at 0.22.  No bleeding reported.   Goal of Therapy:  Heparin level 0.3-0.7 units/ml Monitor platelets by anticoagulation protocol: Yes   Plan:  -2000 unit bolus, increase heparin drip to 1700 units/hr and recheck HL in 6 hrs --Daily CBC/HL -Monitor for bleeding  Eudelia Bunch, Pharm.D. [phone] 12/02/2013 2:25 PM

## 2013-12-02 NOTE — Progress Notes (Signed)
ANTICOAGULATION CONSULT NOTE - Follow Up Consult  Pharmacy Consult for heparin Indication: chest pain/ACS  Allergies  Allergen Reactions  . Lactose Intolerance (Gi)     Patient Measurements: Height: 6\' 4"  (193 cm) Weight: 322 lb 15.6 oz (146.5 kg) IBW/kg (Calculated) : 86.8 Heparin Dosing Weight: 119.9 kg  Vital Signs: Temp: 98.3 F (36.8 C) (06/30 2035) Temp src: Oral (06/30 2035) BP: 131/52 mmHg (06/30 2035) Pulse Rate: 83 (06/30 2035)  Labs:  Recent Labs  12/02/13 0037  12/02/13 0845 12/02/13 1236 12/02/13 1904 12/02/13 2150  HGB 17.3*  --   --   --   --   --   HCT 49.1  --   --   --   --   --   PLT 196  --   --   --   --   --   LABPROT  --   --   --  15.2  --   --   INR  --   --   --  1.20  --   --   HEPARINUNFRC  --   --   --  0.22*  --  0.37  CREATININE 1.15  --   --   --   --   --   TROPONINI  --   < > 0.41* <0.30 <0.30  --   < > = values in this interval not displayed.  Estimated Creatinine Clearance: 109.6 ml/min (by C-G formula based on Cr of 1.15).   Medications:  Infusions:  . heparin 1,700 Units/hr (12/02/13 1557)    Assessment: 58 y/o male on IV heparin for NSTEMI and Afib. Plan is for cath tomorrow. Heparin level is therapeutic at 0.37 on 1700 units/hr. No bleeding noted.   Goal of Therapy:  Heparin level 0.3-0.7 units/ml Monitor platelets by anticoagulation protocol: Yes   Plan:  - Continue heparin drip at 1700 units/hr - Daily heparin level and [place], [place].D., BCPS Clinical Pharmacist Pager: [phone] 12/02/2013 10:34 PM

## 2013-12-02 NOTE — ED Notes (Signed)
Pt placed on 2L O2 [place] for comfort.

## 2013-12-02 NOTE — ED Notes (Signed)
Pt in c/o shortness of breath that is worse with exertion, pt normally has some shortness of breath, but it has been worse recently, pt states he feels weak all the time, denies pain, history of CHF, had some swelling in his feet yesterday but denies any today. Pt speaking in short phrases upon arrival to ED, breathing improves upon resting

## 2013-12-02 NOTE — ED Notes (Signed)
Card MD here to see pt.

## 2013-12-02 NOTE — Progress Notes (Signed)
Subjective: No CP  No SOB at rest  No dizzienss.   Objective: Filed Vitals:   12/02/13 0530 12/02/13 0545 12/02/13 0605 12/02/13 0630  BP: 97/54 93/59 108/74 144/80  Pulse: 81 75 66 72  Temp:    98.4 F (36.9 C)  TempSrc:    Oral  Resp: 18 18  19   Height:    6\' 4"  (1.93 m)  Weight:    322 lb 15.6 oz (146.5 kg)  SpO2: 98% 98%  97%   Weight change:   Intake/Output Summary (Last 24 hours) at 12/02/13 0741 Last data filed at 12/02/13 0700  Gross per 24 hour  Intake      0 ml  Output    200 ml  Net   -200 ml    General: Alert, awake, oriented x3, in no acute distress Neck:  JVP is normal Heart: Regular rate and rhythm, without murmurs, rubs, gallops.  Lungs: Clear to auscultation.  No rales or wheezes. Exemities:  No edema.   Neuro: Grossly intact, nonfocal.   Lab Results: Results for orders placed during the hospital encounter of 12/02/13 (from the past 24 hour(s))  CBC     Status: Abnormal   Collection Time    12/02/13 12:37 AM      Result Value Ref Range   WBC 11.8 (*) 4.0 - 10.5 K/uL   RBC 5.38  4.22 - 5.81 MIL/uL   Hemoglobin 17.3 (*) 13.0 - 17.0 g/dL   HCT 49.1  39.0 - 52.0 %   MCV 91.3  78.0 - 100.0 fL   MCH 32.2  26.0 - 34.0 pg   MCHC 35.2  30.0 - 36.0 g/dL   RDW 12.9  11.5 - 15.5 %   Platelets 196  150 - 400 K/uL  BASIC METABOLIC PANEL     Status: Abnormal   Collection Time    12/02/13 12:37 AM      Result Value Ref Range   Sodium 138  137 - 147 mEq/L   Potassium 3.7  3.7 - 5.3 mEq/L   Chloride 100  96 - 112 mEq/L   CO2 19  19 - 32 mEq/L   Glucose, Bld 159 (*) 70 - 99 mg/dL   BUN 13  6 - 23 mg/dL   Creatinine, Ser 1.15  0.50 - 1.35 mg/dL   Calcium 9.2  8.4 - 10.5 mg/dL   GFR calc non Af Amer 68 (*) >90 mL/min   GFR calc Af Amer 79 (*) >90 mL/min  TROPONIN I     Status: Abnormal   Collection Time    12/02/13 12:38 AM      Result Value Ref Range   Troponin I 0.40 (*) <0.30 ng/mL  PRO B NATRIURETIC PEPTIDE     Status: Abnormal   Collection Time   12/02/13 12:39 AM      Result Value Ref Range   Pro B Natriuretic peptide (BNP) 955.4 (*) 0 - 125 pg/mL  TROPONIN I     Status: Abnormal   Collection Time    12/02/13  4:15 AM      Result Value Ref Range   Troponin I 0.48 (*) <0.30 ng/mL    Studies/Results: No results found.  Medications: Reviewed   @PROBHOSP @  1.  NSTEMIMinimal bump in troponin.   I will review echo  I have seen it quickly while being done  Severe LVH  No definite wall motion abnormalities.  Small pericardial effusion.  Vigorous LV. Ques if bump in trop  is due to afib  Not in now  Or is it due to progressive CAD  Patinet has been feeling worse for a while. On heparin  i would favor L hart cath to define anatomy.  Discussed with patient   He agrees.    2.  Atrial fib.  Had yesterday  New.  NOw in [place]. Will check TSH>  Will review echo. ChadsVasc is 2.  Shoulb be on anticoag.    LOS: 0 days   Dorris Carnes 12/02/2013, 7:41 AM

## 2013-12-02 NOTE — H&P (Signed)
Cardiology H& P Note  Patient ID: Peter Dunlap, MRN: [id number], DOB/AGE: 1955-06-18 58 y.o. 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 In addition pt has noticed symptoms of substernal burning sensation that is non radiating worse with exertion and imoproves with SL NTG in the ER. Pt Reports medication compliance In the ED EKG showed Afib. Pt states that he has history although could confirm this per review of our records,   Past Medical History  Diagnosis Date  . Hypertension     Severe LVH with normal EF  . Arteriosclerotic cardiovascular disease (ASCVD) 2005    catheterization in 10/2010:50% mid LAD, diffuse distal disease, circumflex irregularities, large dominant RCA with a 50% ostial, 70% distal, 60% posterolateral and 70% PDA; normal EF  . Cerebrovascular disease 2010    R. carotid endarterectomy; Duplex in 10/2010-widely patent ICAs, subtotal left vertebral-not thought to be contributing to symptoms  . Hyperlipidemia   . Obesity   . Tobacco abuse     Quit 2014  . Benign prostatic hypertrophy   . Low back pain   . History of PSVT (paroxysmal supraventricular tachycardia) 2005     Diagnosed with ILR; no recurrence following RFA  . Cervical spine disease     CT in 2012-advanced degeneration and spondylosis with moderate spinal stenosis at C3-C6  . H/O: substance abuse     Cocaine, marijuana, alcohol.  Quit 2013.   [name] Syncope   . Gastroesophageal reflux disease   . Depression   . Erectile dysfunction   . Coronary artery disease   . Myocardial infarction   . Anginal pain   . Shortness of breath   . CHF (congestive heart failure)   . H/O hiatal hernia   . Headache(784.0)   . Arthritis   . Carotid artery occlusion       Most Recent Cardiac Studies: Echo 01/2013 - Left ventricle: The cavity size was normal. Wall thickness was increased in a pattern of severe LVH. There was severe concentric hypertrophy. Systolic function was vigorous. The estimated ejection fraction was in the range of 65% to 70%. There was dynamic obstruction. Wall motion was normal; there were no regional wall motion abnormalities. Features are consistent with a pseudonormal left ventricular filling pattern, with concomitant abnormal relaxation and increased filling pressure (grade 2 diastolic dysfunction). - Pericardium, extracardiac: A trivial pericardial effusion was identified.    Surgical History:  Past Surgical History  Procedure Laterality Date  . Radiofrequency ablation  2005    for PSVT  . Cardiac catheterization    . Carotid endarterectomy Right Feb. 25,  2010     CEA     Home Meds: Prior to Admission medications   Medication Sig Start Date End Date Taking? Authorizing Provider  aspirin EC 81 MG tablet Take 81 mg by mouth daily.   Yes Historical Provider, MD  clopidogrel (PLAVIX) 75 MG tablet Take 75 mg by mouth daily.    Yes Historical Provider, MD  cycloSPORINE (RESTASIS) 0.05 % ophthalmic emulsion Place 1 drop into both eyes 2 (two) times daily.   Yes Historical Provider, MD  dexlansoprazole (DEXILANT) 60 MG capsule Take 60 mg by mouth daily.   Yes Historical  Provider, MD  hydrochlorothiazide (MICROZIDE) 12.5 MG capsule Take 12.5 mg by mouth daily.  10/22/13  Yes Historical Provider, MD  metoprolol succinate (TOPROL-XL) 25 MG 24 hr tablet Take 25 mg by mouth daily.   Yes Historical Provider, MD  naproxen (NAPROSYN) 500 MG tablet Take 500 mg by mouth 2 (two) times daily as needed (pain).   Yes Historical Provider, MD  nitroGLYCERIN (NITROSTAT) 0.4 MG SL tablet Place 0.4 mg under the tongue every 5 (five) minutes as needed for chest pain.   Yes Historical Provider, MD  RAPAFLO 8 MG CAPS capsule Take 8 mg by mouth daily.  10/21/13  Yes Historical Provider, MD  simvastatin (ZOCOR) 40 MG tablet Take 40 mg by mouth daily.    Yes Historical Provider, MD  tamsulosin (FLOMAX) 0.4 MG CAPS capsule Take 0.4 mg by mouth daily.   Yes Historical Provider, MD  verapamil (CALAN-SR) 180 MG CR tablet Take 180 mg by mouth at bedtime.   Yes Historical Provider, MD    Inpatient Medications:  . nitroGLYCERIN  1 inch Topical 4 times per day   . heparin 1,400 Units/hr (12/02/13 0324)    Allergies:  Allergies  Allergen Reactions  . Lactose Intolerance (Gi)     History   Social History  . Marital Status: Legally Separated    Spouse Name: N/A    Number of Children: 0  . Years of Education: N/A   Occupational History  . Not on file.   Social History Main Topics  . Smoking status: Former Smoker -- 1.00 packs/day for 40 years    Types: Cigarettes    Quit date: 10/10/2012  . Smokeless tobacco: Never Used     Comment: Quit in May.   . Alcohol Use: No  . Drug Use: No     Comment: quit cocaine 10/2011  . Sexual Activity: No   Other Topics Concern  . Not on file   Social History Narrative  . No narrative on file     Family History  Problem Relation Age of Onset  . Hypertension Mother     Cerebrovascular disease  . Diabetes Mother   . Coronary artery disease Father 30  . Diabetes type II Father   . Hypertension Father   . Heart attack Father   .  Lung cancer Paternal Uncle   . Diabetes Sister   . Hypertension Sister   . Heart attack Sister 66  . Diabetes Brother   . Hypertension Brother      Review of Systems: General: negative for chills, fever, night sweats or weight changes.  Cardiovascular: per HPI  Dermatological: negative for rash Respiratory: negative for cough or wheezing Urologic: negative for hematuria Abdominal: negative for nausea, vomiting, diarrhea, bright red blood per rectum, melena, or hematemesis Neurologic: negative for visual changes, syncope, or dizziness All other systems reviewed and are otherwise negative except as noted above.  Labs:  Recent Labs  12/02/13 0038  TROPONINI 0.40*   Lab Results  Component Value Date   WBC 11.8* 12/02/2013   HGB 17.3* 12/02/2013   HCT 49.1 12/02/2013   MCV 91.3 12/02/2013   PLT 196 12/02/2013    Recent Labs Lab 12/02/13 0037  NA 138  K 3.7  CL 100  CO2 19  BUN 13  CREATININE 1.15  CALCIUM 9.2  GLUCOSE 159*   Lab Results  Component Value Date   CHOL 155 09/06/2011   HDL 46 09/06/2011   LDLCALC 88 09/06/2011   TRIG 104 09/06/2011   Lab Results  Component Value Date   DDIMER  Value: <0.22        AT THE INHOUSE ESTABLISHED CUTOFF VALUE OF 0.48 ug/mL FEU, THIS ASSAY HAS BEEN DOCUMENTED IN THE LITERATURE TO HAVE A SENSITIVITY AND NEGATIVE PREDICTIVE VALUE OF AT LEAST 98 TO 99%.  THE TEST RESULT SHOULD BE CORRELATED WITH AN ASSESSMENT OF THE CLINICAL PROBABILITY OF DVT / VTE. 01/12/2010    Radiology/Studies:  Dg Chest Portable 1 View  12/02/2013   CLINICAL DATA:  Shortness of breath.  Cough  EXAM: PORTABLE CHEST - 1 VIEW  COMPARISON:  01/31/2013  FINDINGS: There is mild cardiac enlargement. A small amount of fluid is noted within the minor fissure. There is pulmonary vascular congestion. No airspace consolidation. Lung volumes are low. The visualized skeletal structures are unremarkable.  IMPRESSION: Cardiac enlargement and pulmonary vascular congestion.    Electronically Signed   By: Kerby Moors M.D.   On: 12/02/2013 01:15    EKG: 12/02/2013 Afib with LVH  and secondary ST/ TWave changes   Physical Exam Blood pressure 100/60, pulse 78, temperature 98.5 F (36.9 C), temperature source Oral, resp. rate 22, SpO2 98.00%. General: obese body habitus, NAD   Neck: Negative for carotid bruits. JVD not elevated. Lungs:Distant lung sounds , Clear bilaterally to auscultation without wheezes, rales, or rhonchi. Breathing is unlabored. Heart: irregular , harsh likely ejection murmur at the base of the heart  Abdomen: Soft, non-tender, non-distended with normoactive bowel sounds. No hepatomegaly. No rebound/guarding. No obvious abdominal masses. Extremities: No clubbing or cyanosis. No edema.  Distal pedal pulses are 2+ and equal bilaterally. Neuro: Alert and oriented X 3. No facial asymmetry. No focal deficit. Moves all extremities spontaneously. Psych:  Responds to questions appropriately with a normal affect.       Cory Roughen, A M.D  12/02/2013, 4:58 AM

## 2013-12-02 NOTE — ED Notes (Signed)
Pt alert, NAD, calm, interactive, resps e/u, speaking in clear complete sentences. VSS. ntg paste applied for 2/10 CP.

## 2013-12-02 NOTE — ED Notes (Signed)
Pt reporting epigastric chest pain.  Sts pain just started a few minutes prior to RN starting heparin.

## 2013-12-02 NOTE — ED Provider Notes (Signed)
CSN: [id number]     Arrival date & time 12/02/13  0018 History   None    Chief Complaint  Patient presents with  . Shortness of Breath     (Consider location/radiation/quality/duration/timing/severity/associated sxs/prior Treatment) HPI  This a 58 year old with history of coronary artery disease, hypertension, CVA, hyperlipidemia who presents with shortness of breath. Patient states he had rapid onset of shortness of breath this afternoon. He reports dyspnea on exertion and orthopnea. Patient repor which is new compared to prior. He reports history of atrial fibrillation.tWhich is also new compared to prior EKG. Patient reports history of atrial fibrillation.s history of CHF and feels this is conPatient reports a history of atrial fibrillation.sistent with prior exacerbations. He denies any chest pain at this time. He does state that on occasion he has "a tinge of pain." His cardiologist is Dr. [name]. He denies any recent weight gain or change in medications. Patient denies any recent cough, feWorkup notable for BNP elevated to 955. Chest x-ray shows avers.  Past Medical History  Diagnosis Date  . Hypertension     Severe LVH with normal EF  . Arteriosclerotic cardiovascular disease (ASCVD) 2005    catheterization in 10/2010:50% mid LAD, diffuse distal disease, circumflex irregularities, large dominant RCA with a 50% ostial, 70% distal, 60% posterolateral and 70% PDA; normal EF  . Cerebrovascular disease 2010    R. carotid endarterectomy; Duplex in 10/2010-widely patent ICAs, subtotal left vertebral-not thought to be contributing to symptoms  . Hyperlipidemia   . Obesity   . Tobacco abuse     Quit 2014  . Benign prostatic hypertrophy   . Low back pain   . History of PSVT (paroxysmal supraventricular tachycardia) 2005    Diagnosed with ILR; no recurrence following RFA  . Cervical spine disease     CT in 2012-advanced degeneration and spondylosis with moderate spinal stenosis at  C3-C6  . H/O: substance abuse     Cocaine, marijuana, alcohol.  Quit 2013.   [name] Syncope   . Gastroesophageal reflux disease   . Depression   . Erectile dysfunction   . Coronary artery disease   . Myocardial infarction   . Anginal pain   . Shortness of breath   . CHF (congestive heart failure)   . H/O hiatal hernia   . Headache(784.0)   . Arthritis   . Carotid artery occlusion    Past Surgical History  Procedure Laterality Date  . Radiofrequency ablation  2005    for PSVT  . Cardiac catheterization    . Carotid endarterectomy Right Feb. 25, 2010     CEA   Family History  Problem Relation Age of Onset  . Hypertension Mother     Cerebrovascular disease  . Diabetes Mother   . Coronary artery disease Father 58  . Diabetes type II Father   . Hypertension Father   . Heart attack Father   . Lung cancer Paternal Uncle   . Diabetes Sister   . Hypertension Sister   . Heart attack Sister 1  . Diabetes Brother   . Hypertension Brother    History  Substance Use Topics  . Smoking status: Former Smoker -- 1.00 packs/day for 40 years    Types: Cigarettes    Quit date: 10/10/2012  . Smokeless tobacco: Never Used     Comment: Quit in May.   . Alcohol Use: No    Review of Systems  Constitutional: Negative.  Negative for fever.  Respiratory: Positive for shortness of  breath. Negative for chest tightness.   Cardiovascular: Negative.  Negative for chest pain and leg swelling.  Gastrointestinal: Negative.  Negative for nausea, vomiting and abdominal pain.  Genitourinary: Negative.  Negative for dysuria.  Neurological: Negative for headaches.  All other systems reviewed and are negative.     Allergies  Lactose intolerance (gi)  Home Medications   Prior to Admission medications   Medication Sig Start Date End Date Taking? Authorizing Provider  aspirin EC 81 MG tablet Take 81 mg by mouth daily.   Yes Historical Provider, MD  clopidogrel (PLAVIX) 75 MG tablet Take 75 mg by  mouth daily.    Yes Historical Provider, MD  cycloSPORINE (RESTASIS) 0.05 % ophthalmic emulsion Place 1 drop into both eyes 2 (two) times daily.   Yes Historical Provider, MD  dexlansoprazole (DEXILANT) 60 MG capsule Take 60 mg by mouth daily.   Yes Historical Provider, MD  hydrochlorothiazide (MICROZIDE) 12.5 MG capsule Take 12.5 mg by mouth daily.  10/22/13  Yes Historical Provider, MD  metoprolol succinate (TOPROL-XL) 25 MG 24 hr tablet Take 25 mg by mouth daily.   Yes Historical Provider, MD  naproxen (NAPROSYN) 500 MG tablet Take 500 mg by mouth 2 (two) times daily as needed (pain).   Yes Historical Provider, MD  nitroGLYCERIN (NITROSTAT) 0.4 MG SL tablet Place 0.4 mg under the tongue every 5 (five) minutes as needed for chest pain.   Yes Historical Provider, MD  RAPAFLO 8 MG CAPS capsule Take 8 mg by mouth daily.  10/21/13  Yes Historical Provider, MD  simvastatin (ZOCOR) 40 MG tablet Take 40 mg by mouth daily.    Yes Historical Provider, MD  tamsulosin (FLOMAX) 0.4 MG CAPS capsule Take 0.4 mg by mouth daily.   Yes Historical Provider, MD  verapamil (CALAN-SR) 180 MG CR tablet Take 180 mg by mouth at bedtime.   Yes Historical Provider, MD   BP 108/55  Pulse 76  Temp(Src) 98.5 F (36.9 C) (Oral)  Resp 26  SpO2 99% Physical Exam  Nursing note and vitals reviewed. Constitutional: He is oriented to person, place, and time.  Obese, no acute distress  HENT:  Head: Normocephalic and atraumatic.  Mouth/Throat: Oropharynx is clear and moist.  Eyes: Pupils are equal, round, and reactive to light.  Neck: Neck supple. No JVD present.  Cardiovascular: Normal rate and normal heart sounds.   No murmur heard. Irregular rhythm  Pulmonary/Chest: Effort normal. No respiratory distress. He has no wheezes.  Final crackles at the bases  Abdominal: Soft. Bowel sounds are normal. There is no tenderness. There is no rebound.  Musculoskeletal: He exhibits edema.  Trace bilateral lower extremity edema   Neurological: He is alert and oriented to person, place, and time.  Skin: Skin is warm and dry.  Psychiatric: He has a normal mood and affect.    ED Course  Procedures (including critical care time)  CRITICAL CARE Performed by: Thayer Jew, F   Total critical care time: 35 min  Critical care time was exclusive of separately billable procedures and treating other patients.  Critical care was necessary to treat or prevent imminent or life-threatening deterioration.  Critical care was time spent personally by me on the following activities: development of treatment plan with patient and/or surrogate as well as nursing, discussions with consultants, evaluation of patient's response to treatment, examination of patient, obtaining history from patient or surrogate, ordering and performing treatments and interventions, ordering and review of laboratory studies, ordering and review of radiographic studies,  pulse oximetry and re-evaluation of patient's condition.  Labs Review Labs Reviewed  CBC - Abnormal; Notable for the following:    WBC 11.8 (*)    Hemoglobin 17.3 (*)    All other components within normal limits  BASIC METABOLIC PANEL - Abnormal; Notable for the following:    Glucose, Bld 159 (*)    GFR calc non Af Amer 68 (*)    GFR calc Af Amer 79 (*)    All other components within normal limits  PRO B NATRIURETIC PEPTIDE - Abnormal; Notable for the following:    Pro B Natriuretic peptide (BNP) 955.4 (*)    All other components within normal limits  TROPONIN I - Abnormal; Notable for the following:    Troponin I 0.40 (*)    All other components within normal limits  HEPARIN LEVEL (UNFRACTIONATED)  TROPONIN I    Imaging Review Dg Chest Portable 1 View  12/02/2013   CLINICAL DATA:  Shortness of breath.  Cough  EXAM: PORTABLE CHEST - 1 VIEW  COMPARISON:  01/31/2013  FINDINGS: There is mild cardiac enlargement. A small amount of fluid is noted within the minor fissure. There  is pulmonary vascular congestion. No airspace consolidation. Lung volumes are low. The visualized skeletal structures are unremarkable.  IMPRESSION: Cardiac enlargement and pulmonary vascular congestion.   Electronically Signed   By: Kerby Moors M.D.   On: 12/02/2013 01:15     EKG Interpretation   Date/Time:  Tuesday December 02 2013 00:28:33 EDT Ventricular Rate:  76 PR Interval:    QRS Duration: 103 QT Interval:  415 QTC Calculation: 467 R Axis:   25 Text Interpretation:  Atrial fibrillation LVH with secondary  repolarization abnormality Anterior ST elevation, probably due to LVH Now  in atrial fibrillation when compared to prior Confirmed by HORTON  MD,  [place] ([id number]) on 12/02/2013 12:33:54 AM      MDM   Final diagnoses:  NSTEMI (non-ST elevated myocardial infarction)  Acute congestive heart failure, unspecified congestive heart failure type    Patient presents with shortness of breath. For shortness of breath came on relatively abruptly. Orthopnea and dyspnea on exertion. He is nontoxic on exam. Oxygen saturations 92-94% on room air. Patient was placed on supplemental oxygen for comfort. No chest pain at this time. EKG showed ST elevation in the anterior leads which is been present on prior EKGs it is likely secondary repolarization due to LVH. He is also currently in atrial fibrillation which is new when compared to prior.  Patient reports history of atrial fibrillation.     Work up notable for elevated BNP and chest xray with pulmonary vascular congestion.  +trop at 0.4.  Patient continues to be CP free.  Given aspirin and heparin drip started.  Nitro not initiated 2/2 borderline pressures (564P systolic).  Cardiology consulted  3:30 am Patient now reporting 2/10 pain.  Repeat EKG with sinus rhythm and similar repolarization changes.  Nitro paste applied.   Merryl Hacker, MD 12/02/13 [phone]

## 2013-12-03 ENCOUNTER — Encounter (HOSPITAL_COMMUNITY): Payer: Self-pay | Admitting: Cardiology

## 2013-12-03 ENCOUNTER — Encounter (HOSPITAL_COMMUNITY)
Admission: EM | Disposition: A | Discharge status: Home / Self Care | Payer: PRIVATE HEALTH INSURANCE | Source: Home / Self Care | Attending: Internal Medicine

## 2013-12-03 DIAGNOSIS — I4891 Unspecified atrial fibrillation: Secondary | ICD-10-CM

## 2013-12-03 DIAGNOSIS — E1169 Type 2 diabetes mellitus with other specified complication: Secondary | ICD-10-CM | POA: Diagnosis present

## 2013-12-03 DIAGNOSIS — E669 Obesity, unspecified: Secondary | ICD-10-CM

## 2013-12-03 DIAGNOSIS — I313 Pericardial effusion (noninflammatory): Secondary | ICD-10-CM | POA: Diagnosis present

## 2013-12-03 DIAGNOSIS — I2510 Atherosclerotic heart disease of native coronary artery without angina pectoris: Secondary | ICD-10-CM

## 2013-12-03 DIAGNOSIS — E1149 Type 2 diabetes mellitus with other diabetic neurological complication: Secondary | ICD-10-CM | POA: Diagnosis present

## 2013-12-03 DIAGNOSIS — I214 Non-ST elevation (NSTEMI) myocardial infarction: Secondary | ICD-10-CM | POA: Diagnosis not present

## 2013-12-03 DIAGNOSIS — I7090 Unspecified atherosclerosis: Secondary | ICD-10-CM

## 2013-12-03 DIAGNOSIS — I3139 Other pericardial effusion (noninflammatory): Secondary | ICD-10-CM | POA: Diagnosis present

## 2013-12-03 HISTORY — PX: LEFT HEART CATHETERIZATION WITH CORONARY ANGIOGRAM: SHX5451

## 2013-12-03 HISTORY — PX: CORONARY ANGIOPLASTY WITH STENT PLACEMENT: SHX49

## 2013-12-03 HISTORY — PX: PERCUTANEOUS CORONARY STENT INTERVENTION (PCI-S): SHX5485

## 2013-12-03 LAB — HEPARIN LEVEL (UNFRACTIONATED): Heparin Unfractionated: 0.42 IU/mL (ref 0.30–0.70)

## 2013-12-03 LAB — BASIC METABOLIC PANEL
BUN: 13 mg/dL (ref 6–23)
CO2: 24 mEq/L (ref 19–32)
Calcium: 8.8 mg/dL (ref 8.4–10.5)
Chloride: 101 mEq/L (ref 96–112)
Creatinine, Ser: 1.07 mg/dL (ref 0.50–1.35)
GFR calc Af Amer: 87 mL/min — ABNORMAL LOW (ref >90)
GFR calc non Af Amer: 75 mL/min — ABNORMAL LOW (ref >90)
Glucose, Bld: 109 mg/dL — ABNORMAL HIGH (ref 70–99)
Potassium: 3.6 mEq/L — ABNORMAL LOW (ref 3.7–5.3)
Sodium: 138 mEq/L (ref 137–147)

## 2013-12-03 LAB — CBC
HCT: 40.4 % (ref 39.0–52.0)
Hemoglobin: 14.1 g/dL (ref 13.0–17.0)
MCH: 31.8 pg (ref 26.0–34.0)
MCHC: 34.9 g/dL (ref 30.0–36.0)
MCV: 91.2 fL (ref 78.0–100.0)
Platelets: 156 10*3/uL (ref 150–400)
RBC: 4.43 MIL/uL (ref 4.22–5.81)
RDW: 12.9 % (ref 11.5–15.5)
WBC: 8.3 10*3/uL (ref 4.0–10.5)

## 2013-12-03 LAB — LIPID PANEL
Cholesterol: 100 mg/dL (ref 0–200)
HDL: 29 mg/dL — ABNORMAL LOW (ref >39)
LDL Cholesterol: 54 mg/dL (ref 0–99)
Total CHOL/HDL Ratio: 3.4 RATIO
Triglycerides: 87 mg/dL (ref <150)
VLDL: 17 mg/dL (ref 0–40)

## 2013-12-03 LAB — PROTIME-INR
INR: 1.21 (ref 0.00–1.49)
Prothrombin Time: 15.3 seconds — ABNORMAL HIGH (ref 11.6–15.2)

## 2013-12-03 LAB — POCT ACTIVATED CLOTTING TIME: Activated Clotting Time: 253 seconds

## 2013-12-03 SURGERY — LEFT HEART CATHETERIZATION WITH CORONARY ANGIOGRAM
Anesthesia: LOCAL

## 2013-12-03 MED ORDER — TICAGRELOR 90 MG PO TABS
ORAL_TABLET | ORAL | Status: AC
  Filled 2013-12-03: qty 2

## 2013-12-03 MED ORDER — ASPIRIN 81 MG PO CHEW: 81.0000 mg | CHEWABLE_TABLET | ORAL | Status: DC

## 2013-12-03 MED ORDER — MIDAZOLAM HCL 2 MG/2ML IJ SOLN
INTRAMUSCULAR | Status: AC
  Filled 2013-12-03: qty 2

## 2013-12-03 MED ORDER — SODIUM CHLORIDE 0.9 % IJ SOLN: 3.0000 mL | INTRAMUSCULAR | Status: DC | PRN

## 2013-12-03 MED ORDER — LIDOCAINE HCL (PF) 1 % IJ SOLN
INTRAMUSCULAR | Status: AC
  Filled 2013-12-03: qty 30

## 2013-12-03 MED ORDER — FENTANYL CITRATE 0.05 MG/ML IJ SOLN
INTRAMUSCULAR | Status: AC
  Filled 2013-12-03: qty 2

## 2013-12-03 MED ORDER — HEPARIN (PORCINE) IN NACL 100-0.45 UNIT/ML-% IJ SOLN
1900.0000 [IU]/h | INTRAMUSCULAR | Status: DC
  Administered 2013-12-03: 1700 [IU]/h via INTRAVENOUS
  Administered 2013-12-05: 05:00:00 1900 [IU]/h via INTRAVENOUS
  Filled 2013-12-03 (×4): qty 250

## 2013-12-03 MED ORDER — NITROGLYCERIN 0.2 MG/ML ON CALL CATH LAB
INTRAVENOUS | Status: AC
  Filled 2013-12-03: qty 1

## 2013-12-03 MED ORDER — SODIUM CHLORIDE 0.9 % IJ SOLN: 3.0000 mL | Freq: Two times a day (BID) | INTRAMUSCULAR | Status: DC

## 2013-12-03 MED ORDER — SODIUM CHLORIDE 0.9 % IJ SOLN
3.0000 mL | Freq: Two times a day (BID) | INTRAMUSCULAR | Status: DC
  Administered 2013-12-03 – 2013-12-04 (×3): 3 mL via INTRAVENOUS

## 2013-12-03 MED ORDER — SODIUM CHLORIDE 0.9 % IV SOLN: 1.0000 mL/kg/h | INTRAVENOUS | Status: AC

## 2013-12-03 MED ORDER — HEPARIN (PORCINE) IN NACL 2-0.9 UNIT/ML-% IJ SOLN
INTRAMUSCULAR | Status: AC
  Filled 2013-12-03: qty 1500

## 2013-12-03 MED ORDER — SODIUM CHLORIDE 0.9 % IV SOLN: INTRAVENOUS | Status: DC

## 2013-12-03 MED ORDER — SODIUM CHLORIDE 0.9 % IV SOLN: 250.0000 mL | INTRAVENOUS | Status: DC

## 2013-12-03 MED ORDER — SODIUM CHLORIDE 0.9 % IV SOLN: 250.0000 mL | INTRAVENOUS | Status: DC | PRN

## 2013-12-03 MED ORDER — MORPHINE SULFATE 2 MG/ML IJ SOLN: 2.0000 mg | INTRAMUSCULAR | Status: DC | PRN

## 2013-12-03 MED ORDER — ONDANSETRON HCL 4 MG/2ML IJ SOLN: 4.0000 mg | Freq: Four times a day (QID) | INTRAMUSCULAR | Status: DC | PRN

## 2013-12-03 NOTE — Progress Notes (Addendum)
Subjective: + SOB with bath and some while lying down, no chest pain  Objective: Vital signs in last 24 hours: Temp:  [98 F (36.7 C)-98.3 F (36.8 C)] 98.2 F (36.8 C) (07/01 0417) Pulse Rate:  [72-83] 76 (07/01 0417) Resp:  [18] 18 (07/01 0417) BP: (86-131)/(52-77) 124/57 mmHg (07/01 0417) SpO2:  [98 %-99 %] 99 % (07/01 0417) Weight change:  Last BM Date: 12/01/13 Intake/Output from previous day: -57 06/30 0701 - 07/01 0700 In: 843 [P.O.:840; I.V.:3] Out: 700 [Urine:700] Intake/Output this shift:    PE: General:Pleasant affect, NAD- sitting in the chair Skin:Warm and dry, brisk capillary refill HEENT:normocephalic, sclera clear, mucus membranes moist Neck:supple, no JVD Heart:S1S2 RRR without murmur, gallup, rub or click Lungs:clear without rales, rhonchi, or wheezes ZOX:WRUEA, soft, non tender, + BS, do not palpate liver spleen or masses Ext:tr lower ext edema, 2+ pedal pulses, 2+ radial pulses Neuro:alert and oriented, MAE, follows commands, + facial symmetry   TELE:  PACs, non conducted PACs and some 2:1 block on review of strips, though that may be non conducted PACs - some of the non conducted beats are back to back.   Lab Results:  Recent Labs  12/02/13 0037 12/03/13 0455  WBC 11.8* 8.3  HGB 17.3* 14.1  HCT 49.1 40.4  PLT 196 156   BMET  Recent Labs  12/02/13 0037 12/03/13 0455  NA 138 138  K 3.7 3.6*  CL 100 101  CO2 19 24  GLUCOSE 159* 109*  BUN 13 13  CREATININE 1.15 1.07  CALCIUM 9.2 8.8    Recent Labs  12/02/13 1236 12/02/13 1904  TROPONINI <0.30 <0.30    Lab Results  Component Value Date   CHOL 155 09/06/2011   HDL 46 09/06/2011   LDLCALC 88 09/06/2011   TRIG 104 09/06/2011   CHOLHDL 3.4 09/06/2011   Lab Results  Component Value Date   HGBA1C 6.4* 12/02/2013     Lab Results  Component Value Date   TSH 1.684 01/13/2013       Studies/Results: Dg Chest Portable 1 View  12/02/2013   CLINICAL DATA:  Shortness of  breath.  Cough  EXAM: PORTABLE CHEST - 1 VIEW  COMPARISON:  01/31/2013  FINDINGS: There is mild cardiac enlargement. A small amount of fluid is noted within the minor fissure. There is pulmonary vascular congestion. No airspace consolidation. Lung volumes are low. The visualized skeletal structures are unremarkable.  IMPRESSION: Cardiac enlargement and pulmonary vascular congestion.   Electronically Signed   By: Kerby Moors M.D.   On: 12/02/2013 01:15   ECHO: Left ventricle: The cavity size was mildly reduced. Wall thickness was increased in a pattern of severe LVH. There is mitral chordal systolic anterior motion. 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 Systolic function was vigorous. The estimated ejection fraction was in the range of 70% to 75%. There was dynamic obstruction at restin the outflow tract, with a peak velocity of 365 cm/sec and a peak gradient of 53 mm Hg. Wall motion was normal; there were no regional wall motion abnormalities. Doppler parameters are consistent with abnormal left ventricular relaxation (grade 1 diastolic dysfunction). Doppler parameters are consistent with elevated ventricular end-diastolic filling pressure. - Aortic valve: Mildly to moderately calcified annulus. Trileaflet; mildly calcified leaflets. Cusp separation was mildly reduced. There was no significant regurgitation. - Mitral valve: Mildly thickened leaflets . There was trivial regurgitation. - Left atrium: The atrium was mildly dilated. - Tricuspid valve:  There was trivial regurgitation. - Pulmonary arteries: Systolic pressure could not be accurately estimated. - Inferior vena cava: Not visualized. Unable to estimate CVP. - Pericardium, extracardiac: A moderate pericardial effusion was identified circumferential to the heart.  Impressions:  - Reduced LV chamber size with severe LVH consistent with hypertensive or hypertrophic cardiomyopathy. There is mitral chordal SAM  with increased LVOT gradient as noted, LVEF 70-75% and grade 1 diastolic dysfunction with increased filling pressures. Mild left atrial enlargement. Sclerotic aortic valve without stenosis. Trivial tricuspid regurgitation, unable to assess PASP. Moderate sized, circumferential pericardial effusion without clear evidence of tamponade physiology.   Medications: I have reviewed the patient's current medications. Scheduled Meds: . aspirin  81 mg Oral Pre-Cath  . aspirin EC  81 mg Oral Daily  . clopidogrel  75 mg Oral Daily  . cycloSPORINE  1 drop Both Eyes BID  . hydrochlorothiazide  12.5 mg Oral Daily  . metoprolol succinate  25 mg Oral Daily  . pantoprazole  40 mg Oral Daily  . simvastatin  40 mg Oral q1800  . sodium chloride  3 mL Intravenous Q12H  . tamsulosin  0.4 mg Oral Daily  . verapamil  180 mg Oral QHS   Continuous Infusions: . 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  Principal Problem:   NSTEMI (non-ST elevated myocardial infarction), pk troponin 0.48- for cath today.  He did not eat BK. Rule out CAD as cause of symptoms vs. Pericardial effusion. Active Problems:   Hypertension- controlled   Arteriosclerotic cardiovascular disease (ASCVD)   Hyperlipidemia- check lipids   PAF (paroxysmal atrial fibrillation) on admit now SR, at times non conducted PACs at times ?2;1 block, +PACS  occ PVC    Pericardial effusion without cardiac tamponade    Diabetes new with HgBA1C 6.4- add diabetic diet when able to eat   SOB- pt with shortness of breath at rest and with  any exertion.     LOS: 1 day   Time spent with pt. :15 minutes. [facility]  Nurse Practitioner Certified Pager [phone] or after 5pm and on weekends call [phone] 12/03/2013, 8:20 AM   Patinet seen and examined Agree with findings of L [place] above.   I saw him yesterday and recomm L heart cath.  Postponed to today Patient denies CP   But, gets very sSOB just walking to sink.  May be due to HCM but need to exclude CAD.  Cath today.   No recurrence of PAF  Will need to address prior to d/c   Dorris Carnes

## 2013-12-03 NOTE — CV Procedure (Signed)
CARDIAC CATHETERIZATION AND PERCUTANEOUS CORONARY INTERVENTION REPORT  NAME:  Peter Dunlap   MRN: [id number] DOB:  1956/03/16   ADMIT DATE: 12/02/2013 Procedure Date: 12/03/2013  INTERVENTIONAL CARDIOLOGIST: Leonie Man, M.D., MS PRIMARY CARE PROVIDER: Maricela Curet, MD PRIMARY CARDIOLOGIST:  [name], MD  PATIENT:  Peter Dunlap is a 58 y.o. male with PMH of R Carotid Endarterectomy & moderate to severe 2 Vessel CAD by catheterization in 2012 -- treated medically. He presented to [facility] ER with ~1 wee of worsening exertional dyspnea with mild chest pressure.  Symptoms now occur with minimal exertion (Class III-IV).  Additional symptoms of PND, orthopnea & edema.  The chest pressure/burning was relieved by SL NTG in the ER.  He was also noted to be in Afib.  He ruled  In for ACS-mild NSTEMI with a slight troponin elevation. He is referred for cardiac catheterization (Cath) with possible Percutaneous Coronary Intervention (PCI).  PRE-OPERATIVE DIAGNOSIS:    NTEMI  Known CAD  PROCEDURES PERFORMED:    Left Heart Catheterization with Native Coronary Angiography  via Right Radial Artery   Left Ventriculography  PROCEDURE: The patient was brought to the 2nd [facility] Lab in the fasting state and prepped and draped in the usual sterile fashion for Right Radial artery access. A modified Allen's test was performed on the Righ wrist demonstrating excellent collateral flow for radial access.   Sterile technique was used including antiseptics, cap, gloves, gown, hand hygiene, mask and sheet. Skin prep: Chlorhexidine.   Consent: Risks of procedure as well as the alternatives and risks of each were explained to the (patient/caregiver). Consent for procedure obtained.   Time Out: Verified patient identification, verified procedure, site/side was marked, verified correct patient position, special equipment/implants available,  medications/allergies/relevent history reviewed, required imaging and test results available. Performed.  Access:   Right Radial Artery: 6 Fr Sheath -  Seldinger Technique (Angiocath Micropuncture Kit)  Radial Cocktail - 10 mL; IV Heparin 5000 Units   Left Heart Catheterization: 5Fr Catheters advanced or exchanged over a Long Exchange Safety J-wire; JR4 catheter advanced first.  Right Coronary Artery Cineangiography: JR 4 Catheter  Left Coronary Artery: JL 3.5 Catheter   LV Hemodynamics: JR4  Sheath removed in the Cath Lab with TR Band placement for hemostasis.  TR Band: 1405 Hours; 13 mL air  FINDINGS:  Hemodynamics:   Central Aortic Pressure / Mean: Prelim 67/48/56 mmHg; 138/95/114 mmHg following IVF bolus  Left Ventricular Pressure / LVEDP: 70/10/16 mmHg   Left Ventriculography: Deferred - recent Echo with EF 70-75%, Hypertropic LVH with outflow tract pressure gradient.  Coronary Anatomy:  Dominance: Right  Left Main: Normal caliber vessel with mild calcification, bifurcates into the Left Anterior Descending Artery (LAD) and Circumflex (Cx) LAD: normal caliber vessel with a small diagonal branch and major septal perforator proximally.  There is a short ~40% stenosis followed by a short "normal" segment just up to the First Diagonal (D1). Just beyond D1, there is an eccentric, focal 80-90% stenosis.  Beyond this stenosis, the LAD gives of a moderate to large caliber mid Vessel D3 that is larger than the quite small caliber "Septal LAD".  D2: Small to moderate caliber vessel just proximal to the LAD lesion.  Mild luminal Irregularities.  D3: Moderate caliber vessel that perfuses the mid to distal anterolateral wall.  Minimal luminal irregularities.   Left Circumflex: Normal caliber vessel with diffuse mild disease, but no angiographically notable lesion. It bifurcates into OM1 and the  AV Groove Cx that terminates as OM2 with a diminutive Left Posterolateral Branch  (LPL).  OM1: Moderate caliber vessel; minimal luminal irregularities.  OM 2: Small to moderate caliber vessel that tapers into a diminutive branch distallyl; minimal luminal irregularities   RCA: Large caliber, dominant vessel with a focal 40-50% proximal lesion.  The Mid vessel is angiographically normal.      The distal RCA bifurcates into the Right Posterior Descending Artery (RPDA) and the Right Posterior AV Groove Branch (RPAV).  There is a stable ~60-70% lesion in essentially a 1,1,1 configuration involving the "ostial" portion of each branch. Each branch has minimal disease beyond the ostial lesions.  There is a smaller secondary RPDA with focal ~70% ostial stenosis from the main RPDA.    After reviewing the initial angiography, the culprit lesion was thought to be 80-90% mid LAD lesion at D2.  Preparation were made to proceed with PCI on this lesion.  Percutaneous Coronary Intervention:  Sheath exchanged for 6 Fr Guide: 6 Fr   XBLAD Guidewire: ProWater Predilation Balloon: Emerge 2.0 mm x 12 mm;   10 Atm x 20 Sec, Stent: Promus Premier DES 3.0 mm x 16 mm;   Max Inflation: 16 Atm x 30 Sec, Post-dilation Balloon: [place] Emerge 3.25 mm x 12 mm;   14 Atm x 30 Sec, 16 Atm x 45 Sec (mid & proximal stent)  Final Diameter: 3.3 mm  Post deployment angiography in multiple views, with and without guidewire in place revealed excellent stent deployment and lesion coverage.  There was no evidence of dissection or perforation.  MEDICATIONS:  Anesthesia:  Local Lidocaine 2 ml  Sedation:  1 mg IV Versed, 50 mcg IV fentanyl ;   Omnipaque Contrast: 160 ml  Anticoagulation:  IV Heparin 5000 + 2000 (x2) Units -- Total 9000 Units; ACT > 250 Sec  Anti-Platelet Agent:  Brilinta 180 mg Radial Cocktail: 5 mg Verapamil, 400 mcg NTG, 2 ml 2% Lidocaine in 10 ml NS  PATIENT DISPOSITION:    The patient was transferred to the PACU holding area in a hemodynamicaly stable, chest pain free  condition.  The patient tolerated the procedure well, and there were no complications.  EBL:   < 10 ml  The patient was stable before, during, and after the procedure.  POST-OPERATIVE DIAGNOSIS:    Severe mid LAD ~80-90% stenosis just after D1 and large SP trunk; distal LAD gives off major midLAD D2 that is larger that the distal LAD. Distal LAD reaches the apex. D2 covers entire anterolateral wall.  Successful PCI of mid LAD with Promus DES  Stable distal RCA-PDA-PL bifurcation ~60-70% stenosis and diffuse mild disease in Cx-OM1-OM2  Hyperdynamic LVEF with mild to moderately elevated LVEDP.   PLAN OF CARE:  Transfer to [facility] for standard Post Radial Cath PCI care.  DAPT x minimum of 1 year (has been on Plavix chronically - Check p2Y 12 at f/u visit)   Continue current regimen.   Anticipate d/c in AM.    Leonie Man, M.D., M.S. Interventional Cardiologist   Pager # [phone] 12/03/2013

## 2013-12-03 NOTE — Progress Notes (Signed)
ANTICOAGULATION CONSULT NOTE - Follow Up Consult  Pharmacy Consult for heparin Indication: chest pain/ACS  Allergies  Allergen Reactions  . Lactose Intolerance (Gi)     Patient Measurements: Height: 6\' 4"  (193 cm) Weight: 322 lb 15.6 oz (146.5 kg) IBW/kg (Calculated) : 86.8 Heparin Dosing Weight: 119.9 kg  Vital Signs: Temp: 98.2 F (36.8 C) (07/01 0417) Temp src: Oral (07/01 0417) BP: 124/57 mmHg (07/01 0417) Pulse Rate: 76 (07/01 0417)  Labs:  Recent Labs  12/02/13 0037  12/02/13 0845 12/02/13 1236 12/02/13 1904 12/02/13 2150 12/03/13 0455  HGB 17.3*  --   --   --   --   --  14.1  HCT 49.1  --   --   --   --   --  40.4  PLT 196  --   --   --   --   --  156  LABPROT  --   --   --  15.2  --   --   --   INR  --   --   --  1.20  --   --   --   HEPARINUNFRC  --   --   --  0.22*  --  0.37 0.42  CREATININE 1.15  --   --   --   --   --  1.07  TROPONINI  --   < > 0.41* <0.30 <0.30  --   --   < > = values in this interval not displayed.  Estimated Creatinine Clearance: 117.8 ml/min (by C-G formula based on Cr of 1.07).   Medications:  Infusions:  . sodium chloride    . sodium chloride    . heparin 1,700 Units/hr (12/02/13 1557)    Assessment: 58 y/o male on IV heparin for NSTEMI and Afib. Plan is for cath today.  Heparin level is therapeutic at 0.42 on 1700 units/hr. No bleeding noted.   Goal of Therapy:  Heparin level 0.3-0.7 units/ml Monitor platelets by anticoagulation protocol: Yes   Plan:  - Continue heparin drip at 1700 units/hr - Daily heparin level and CBC - f/u for transition to PO anticoagulant for afib/stroke prevention  Eudelia Bunch, Pharm.D. [phone] 12/03/2013 9:33 AM

## 2013-12-03 NOTE — Care Management Note (Addendum)
  Page 1 of 1   12/05/2013     8:48:12 AM CARE MANAGEMENT NOTE 12/05/2013  Patient:  Peter Dunlap, Peter Dunlap   Account Number:  [id number]  Date Initiated:  12/03/2013  Documentation initiated by:  AMERSON,JULIE  Subjective/Objective Assessment:   Pt adm on 12/02/13 with NSTEMI.  PTA, pt independent, lives with spouse.     Action/Plan:   Will follow for dc needs as pt progresses.   Anticipated DC Date:  12/05/2013   Anticipated DC Plan:  [place]  CM consult      Choice offered to / List presented to:             Status of service:  Completed, signed off Medicare Important Message given?   (If response is "NO", the following Medicare IM given date fields will be blank) Date Medicare IM given:   Medicare IM given by:   Date Additional Medicare IM given:   Additional Medicare IM given by:    Discharge Disposition:  HOME/SELF CARE  Per UR Regulation:  Reviewed for med. necessity/level of care/duration of stay  If discussed at [place] of Stay Meetings, dates discussed:    Comments:  Crystal Hutchinson RN, BSN, MSHL, CCM  Nurse - Case Manager, (Unit [phone]  12/05/2013 Initial IM 12/03/13 by admissions. Meds: Plavix

## 2013-12-03 NOTE — Progress Notes (Signed)
ANTICOAGULATION CONSULT NOTE - Follow Up Consult  Pharmacy Consult for heparin Indication: intermittent afib s/p cath   Allergies  Allergen Reactions  . Lactose Intolerance (Gi)     Patient Measurements: Height: 6\' 4"  (193 cm) Weight: 321 lb 11.2 oz (145.922 kg) IBW/kg (Calculated) : 86.8 Heparin Dosing Weight: 119.9 kg  Vital Signs: Temp: 98.2 F (36.8 C) (07/01 2052) Temp src: Oral (07/01 2052) BP: 103/65 mmHg (07/01 2130) Pulse Rate: 79 (07/01 2052)  Labs:  Recent Labs  12/02/13 0037  12/02/13 0845 12/02/13 1236 12/02/13 1904 12/02/13 2150 12/03/13 0455 12/03/13 1031  HGB 17.3*  --   --   --   --   --  14.1  --   HCT 49.1  --   --   --   --   --  40.4  --   PLT 196  --   --   --   --   --  156  --   LABPROT  --   --   --  15.2  --   --   --  15.3*  INR  --   --   --  1.20  --   --   --  1.21  HEPARINUNFRC  --   --   --  0.22*  --  0.37 0.42  --   CREATININE 1.15  --   --   --   --   --  1.07  --   TROPONINI  --   < > 0.41* <0.30 <0.30  --   --   --   < > = values in this interval not displayed.  Estimated Creatinine Clearance: 117.5 ml/min (by C-G formula based on Cr of 1.07).     Assessment: 58 y/o male s/p left heart cath and pci to LAD with DES. Now with intermittent afib on arising to go to bathroom. Otherwise NSR in 60-80's in bed. Heparin to restart with no bolus. Radial site is bandaged. Band removed at 1515.    Goal of Therapy:  Heparin level 0.3-0.7 units/ml Monitor platelets by anticoagulation protocol: Yes   Plan:  Restart heparin drip at 1700 units/hr. HL in hours.  Daily heparin and cbc level starting 7/3 if infusion continues  Curlene Dolphin  [phone]

## 2013-12-03 NOTE — Progress Notes (Signed)
Alerted by central telemetry tech that pt rhythm changed from SR to a-fib.  HR 60-80's at rest, up to 120's while using bathroom.  BP 103/65.  On aspirin, plavix, and verapamil.  Dr Radford Pax informed.  Rt radial site level 0, TR band off and drsg on at 1515.  Order received to have heparin restarted per pharmacy with no bolus.

## 2013-12-03 NOTE — Brief Op Note (Signed)
   NAME:  Peter Dunlap   MRN: [id number] DOB:  1955/09/19   ADMIT DATE: 12/02/2013  Brief Cardia Catheterization Note:  Indication: 1. NSTEMI  Procedures: 1. Left Heart Catheterization with Native Coroanry Angiography via Right Radial Artery access  5 Fr TIG 4.0 Cath - LCA & RCA Angiography, LV Pressures 2. Percutaneous Coronary Intervention on mid LAD 80-90% stenosis with Promus Premier DES 3.0 mm x 16 mm (3.3 mm)   6 Fr XB LAD; [place] wire  Pre-dilation: 2.0 mm x 12 mm Emerge pre-dilation  Stent: Promus Premier DES 3.0 mm x16 mm - 16 Atm x 30 sec  Post-Dilation: [place] Emerge 3.25 mm x 12 mm - 16 Atm x 45 Sec (3.3 mm)  Medications:  1 mg Versed IV; 50 mcg Fentanyl IV  Heparin 7000 Units + 2000 Units for PCI Radial Cocktail: 5 mg Verapamil, 400 mcg NTG, 2 ml 2% Lidocaine in 10 ml NS Brilinta 180 mg Contrast 160 mL  Impression:  Severe mid LAD ~80-90% stenosis just after D1 and large SP trunk; distal LAD gives off major midLAD D2 that is larger that the distal LAD.  Distal LAD reaches the apex.  D2 covers entire anterolateral wall.  Successful PCI of mid LAD with Promus DES  Stable distal RCA-PDA-PL bifurcation ~60-70% stenosis  Diffuse mild disease in Cx-OM1-OM2  Hemodynamics:   Central AoP: Prelim 67/48/56 mmHg; 138/95/114 mmHg following IVF bolus  LVP/EDP: 70/10/16 mmHg  LV Gram Deferred - recent Echo  Recommendations:  DAPT x minimum of 1 year (has been on Plavix chronically - Check p2Y 12 at f/u visit)  Continue current regimen.  Anticipate d/c in AM.  Full note to follow  Leonie Man, M.D., M.S. Interventional Cardiologist   Pager # [phone] 12/03/2013 2:11 PM

## 2013-12-03 NOTE — Interval H&P Note (Signed)
History and Physical Interval Note:  12/03/2013 12:48 PM  Peter Dunlap  has presented today for surgery, with the diagnosis of NSTEMI.  The various methods of treatment have been discussed with the patient and family. After consideration of risks, benefits and other options for treatment, the patient has consented to  Procedure(s): LEFT HEART CATHETERIZATION WITH CORONARY ANGIOGRAM (Left) +/- PCI as a surgical intervention .    The patient's history has been reviewed, patient examined, no change in status, stable for surgery.  I have reviewed the patient's chart and labs.  Questions were answered to the patient's satisfaction.     HARDING,DAVID W  Cath Lab Visit (complete for each Cath Lab visit)  Clinical Evaluation Leading to the Procedure:   ACS: Yes.    Non-ACS:    Anginal Classification: CCS III  Anti-ischemic medical therapy: Maximal Therapy (2 or more classes of medications)  Non-Invasive Test Results: No non-invasive testing performed  Prior CABG: No previous CABG

## 2013-12-03 NOTE — Progress Notes (Signed)
TR BAND REMOVAL  LOCATION:  right radial  DEFLATED PER PROTOCOL:  Yes.    TIME BAND OFF / DRESSING APPLIED:   1515   SITE UPON ARRIVAL:   Level 0  SITE AFTER BAND REMOVAL:  Level 0  REVERSE ALLEN'S TEST:    positive  CIRCULATION SENSATION AND MOVEMENT:  Within Normal Limits  Yes.    COMMENTS:    

## 2013-12-04 DIAGNOSIS — I319 Disease of pericardium, unspecified: Secondary | ICD-10-CM

## 2013-12-04 DIAGNOSIS — I214 Non-ST elevation (NSTEMI) myocardial infarction: Secondary | ICD-10-CM

## 2013-12-04 DIAGNOSIS — G4730 Sleep apnea, unspecified: Secondary | ICD-10-CM | POA: Diagnosis present

## 2013-12-04 DIAGNOSIS — E669 Obesity, unspecified: Secondary | ICD-10-CM | POA: Diagnosis present

## 2013-12-04 LAB — CBC
HCT: 42.3 % (ref 39.0–52.0)
Hemoglobin: 14.3 g/dL (ref 13.0–17.0)
MCH: 31.3 pg (ref 26.0–34.0)
MCHC: 33.8 g/dL (ref 30.0–36.0)
MCV: 92.6 fL (ref 78.0–100.0)
Platelets: 155 10*3/uL (ref 150–400)
RBC: 4.57 MIL/uL (ref 4.22–5.81)
RDW: 13 % (ref 11.5–15.5)
WBC: 8.7 10*3/uL (ref 4.0–10.5)

## 2013-12-04 LAB — BASIC METABOLIC PANEL
Anion gap: 13 (ref 5–15)
BUN: 12 mg/dL (ref 6–23)
CO2: 24 mEq/L (ref 19–32)
Calcium: 8.7 mg/dL (ref 8.4–10.5)
Chloride: 101 mEq/L (ref 96–112)
Creatinine, Ser: 1.16 mg/dL (ref 0.50–1.35)
GFR calc Af Amer: 78 mL/min — ABNORMAL LOW (ref >90)
GFR calc non Af Amer: 68 mL/min — ABNORMAL LOW (ref >90)
Glucose, Bld: 111 mg/dL — ABNORMAL HIGH (ref 70–99)
Potassium: 4.2 mEq/L (ref 3.7–5.3)
Sodium: 138 mEq/L (ref 137–147)

## 2013-12-04 LAB — HEPARIN LEVEL (UNFRACTIONATED): Heparin Unfractionated: 0.31 IU/mL (ref 0.30–0.70)

## 2013-12-04 MED ORDER — FUROSEMIDE 10 MG/ML IJ SOLN
40.0000 mg | Freq: Once | INTRAMUSCULAR | Status: AC
  Administered 2013-12-04: 09:00:00 40 mg via INTRAVENOUS
  Filled 2013-12-04: qty 4

## 2013-12-04 MED ORDER — FUROSEMIDE 10 MG/ML IJ SOLN
40.0000 mg | Freq: Once | INTRAMUSCULAR | Status: AC
  Administered 2013-12-04: 07:00:00 40 mg via INTRAVENOUS
  Filled 2013-12-04: qty 4

## 2013-12-04 MED ORDER — HYDROCHLOROTHIAZIDE 12.5 MG PO CAPS: 12.5000 mg | ORAL_CAPSULE | Freq: Every day | ORAL | Status: DC

## 2013-12-04 MED ORDER — METOPROLOL SUCCINATE ER 25 MG PO TB24
25.0000 mg | ORAL_TABLET | Freq: Two times a day (BID) | ORAL | Status: DC
  Administered 2013-12-04 – 2013-12-05 (×3): 25 mg via ORAL
  Filled 2013-12-04 (×4): qty 1

## 2013-12-04 MED ORDER — HYDROCHLOROTHIAZIDE 12.5 MG PO CAPS
12.5000 mg | ORAL_CAPSULE | Freq: Every day | ORAL | Status: DC
  Filled 2013-12-04: qty 1

## 2013-12-04 NOTE — Progress Notes (Signed)
Tele shows rhythm returned to SR 70's.  Pt dozing quietly, awakens easily.  Rt radial level 0.

## 2013-12-04 NOTE — Progress Notes (Signed)
Pt states breathing better today, states could not breathe lying down and ankles swollen on admission.  Discussed S/S CHF, low salt diet, daily weights, and when to call MD. Pt watched a-fib video, questions answered.  Pt eager to learn and demonstrates understanding using teach back method.  Stressed importance of medication and follow-up with cardiology.  Pt states takes meds as prescribed, has not smoked in 14 months or used illicit drugs in over 2 years.   Congratulated patient.  Heparin gtt continues, Rt radial level 0.  Tolerating BRP well but states he has SOB  when heart rhythm gets irregular, usually occurs when ambulating.

## 2013-12-04 NOTE — Progress Notes (Addendum)
Subjective:  Some SOB last night, required O2, recurrent PAF with RVR as well.  Objective:  Vital Signs in the last 24 hours: Temp:  [98.1 F (36.7 C)-98.7 F (37.1 C)] 98.7 F (37.1 C) (07/02 0531) Pulse Rate:  [67-92] 73 (07/02 0531) Resp:  [18-20] 20 (07/02 0531) BP: (102-140)/(55-97) 120/55 mmHg (07/02 0531) SpO2:  [91 %-100 %] 99 % (07/02 0531) Weight:  [321 lb 6.9 oz (145.8 kg)-321 lb 11.2 oz (145.922 kg)] 321 lb 6.9 oz (145.8 kg) (07/02 0029)  Intake/Output from previous day:  Intake/Output Summary (Last 24 hours) at 12/04/13 0620 Last data filed at 12/04/13 0531  Gross per 24 hour  Intake 783.61 ml  Output   1225 ml  Net -441.39 ml    Physical Exam: General appearance: alert, cooperative, no distress and morbidly obese Lungs: clear to auscultation bilaterally Heart: regular rate and rhythm and 2/6 systolic murmur Extremities: Rt wrist without hematoma   Rate: 78  Rhythm: normal sinus rhythm and PAF with RVR last pm  Lab Results:  Recent Labs  12/03/13 0455 12/04/13 0327  WBC 8.3 8.7  HGB 14.1 14.3  PLT 156 155    Recent Labs  12/03/13 0455 12/04/13 0327  NA 138 138  K 3.6* 4.2  CL 101 101  CO2 24 24  GLUCOSE 109* 111*  BUN 13 12  CREATININE 1.07 1.16    Recent Labs  12/02/13 1236 12/02/13 1904  TROPONINI <0.30 <0.30    Recent Labs  12/03/13 1031  INR 1.21    Imaging: Imaging results have been reviewed  Cardiac Studies:  Assessment/Plan:  58 y.o. male with PMH of PVD, HTN and HCVD, obesity, & moderate to severe 2 Vessel CAD by catheterization in 2012 -- treated medically.  He presented to [facility] ER 12/02/13 with ~1 week of worsening exertional dyspnea with mild chest pressure.  He was also noted to be in Afib. He ruled In for a NSTEMI. Echo shows HOCM with a 4mmHg gradient and EF of 70-75% with a moderate pericardial effusion. Cath done 12/03/13 showed progression of CAD and he underwent LAD DES. He has residual  RCA/PD/PL bifurcation disease to be treated medically. Post PCI he had recurrent PAF with RVR. Heparin resumed. He will need chronic anticoagulation. He has sleep apnea by history as well and will need a sleep study as an OP.     Principal Problem:   NSTEMI (non-ST elevated myocardial infarction) Active Problems:   Arteriosclerotic cardiovascular disease (ASCVD)   PAF (paroxysmal atrial fibrillation)   Hypertension   Hypertrophic cardiomyopathy   Pericardial effusion without cardiac tamponade   Diabetes mellitus type 2 in obese   GERD   PSVT (paroxysmal supraventricular tachycardia)   Hyperlipidemia   Obesity (BMI 30-39.9)   Sleep apnea by history    PLAN: Will review with MD- he was SOB last night and required O2, sat was 91%. He also had elevated LVEDP at cath as well as recurrent PAF with RVR (NSR now). He may need to stay another 24 hrs for diuresis (BNP 955 on 12/02/13).  Increase Toprol to 25 mg BID. He will need a sleep study as an OP-(his wife had a sleep study yesterday).   Kerin Ransom PA-C [facility] [phone] 12/04/2013, 6:20 AM   Patient seen and examined. Agree with assessment and plan. Increasing sob last night. Breathing better now following IV furosemide. Now back in NSR. S/P DES stent yesterday. HOCM with 53 mm LVOT gradient. Moderate pericardial effusion  without hemodynamic compromise on echo. Will keep today. Now on DAPT; hesitant for triple drug  therapy with circumferential moderate pericardial effusion. If maintains NSR prob not initiate oral anticoagulation presently unless recurrent AF.   Troy Sine, MD, [facility] 12/04/2013 9:39 AM

## 2013-12-04 NOTE — Progress Notes (Signed)
Pt inadvertently received two doses of IV Lasix- will monitor closely.   Kerin Ransom PA-C 12/04/2013 10:12 AM

## 2013-12-04 NOTE — Progress Notes (Signed)
CARDIAC REHAB PHASE I   PRE:  Rate/Rhythm: 76 SR with PVCs    BP: sitting 151/64    SaO2: 97 RA  MODE:  Ambulation: 500 ft   POST:  Rate/Rhythm: 96 SR with increased PAC/PVCs    BP: sitting 163/61     SaO2: 100 RA  Tolerated fairly well. SaO2 good. Increased ectopy, but no afib.  Ed completed. Discussed increased A1C with pt and DM diet. Pt admits to eating too much. Encouraged him to see his PCP soon. Encouraged increased ex and activity. Pt interested in CRPII and will send referral to [place]. [phone]  Peter Dunlap CES, ACSM 12/04/2013 8:49 AM

## 2013-12-04 NOTE — Progress Notes (Signed)
12/03/13 2336 12/03/13 2338  Oxygen Therapy  SpO2 91 % 97 %  O2 Device None (Room air) Nasal cannula  O2 Flow Rate (L/min) --  2 L/min   Maalox effective for c/o indigestion.  Pt now c/o feeling SOB, RR 24/min.  HOB increased to 30 degrees.  O2 sat 91% on RA, placed back on 2L per [place], O2 sat up to 97%.  Denies CP.  BP 127/58.  Tele shows a-fib/flutter, rate much more variable than earlier, ranging 80-115, up to 130 very briefly with turning in bed.  States feels better with oxygen on.  Rt radial level 0.  Advised pt to call immediately w/ any bleeding due to heparin restarted.  Pt voices understanding.  Close monitoring continues.

## 2013-12-04 NOTE — Progress Notes (Signed)
Patient went into AFIB  Heart rate as low as 49 to 60  notified [place] P.A. And EKG done. Patient denies pain or shortness of breath at this time BP is 96/41 and I will continue to monitor. Patient instructed to call me for any changes or feeling different. He is currently on heparin drip.

## 2013-12-04 NOTE — Progress Notes (Signed)
ANTICOAGULATION CONSULT NOTE - Follow Up Consult  Pharmacy Consult for heparin Indication: atrial fibrillation  Allergies  Allergen Reactions  . Lactose Intolerance (Gi)     Patient Measurements: Height: 6\' 4"  (193 cm) Weight: 321 lb 6.9 oz (145.8 kg) IBW/kg (Calculated) : 86.8 Heparin Dosing Weight: 119.9 kg  Vital Signs: Temp: 98.7 F (37.1 C) (07/02 0531) Temp src: Oral (07/02 0531) BP: 120/55 mmHg (07/02 0531) Pulse Rate: 73 (07/02 0706)  Labs:  Recent Labs  12/02/13 0037  12/02/13 0845  12/02/13 1236 12/02/13 1904 12/02/13 2150 12/03/13 0455 12/03/13 1031 12/04/13 0327 12/04/13 0706  HGB 17.3*  --   --   --   --   --   --  14.1  --  14.3  --   HCT 49.1  --   --   --   --   --   --  40.4  --  42.3  --   PLT 196  --   --   --   --   --   --  156  --  155  --   LABPROT  --   --   --   --  15.2  --   --   --  15.3*  --   --   INR  --   --   --   --  1.20  --   --   --  1.21  --   --   HEPARINUNFRC  --   --   --   < > 0.22*  --  0.37 0.42  --   --  0.31  CREATININE 1.15  --   --   --   --   --   --  1.07  --  1.16  --   TROPONINI  --   < > 0.41*  --  <0.30 <0.30  --   --   --   --   --   < > = values in this interval not displayed.  Estimated Creatinine Clearance: 108.4 ml/min (by C-G formula based on Cr of 1.16).   Medications:  Scheduled:  . aspirin EC  81 mg Oral Daily  . clopidogrel  75 mg Oral Daily  . cycloSPORINE  1 drop Both Eyes BID  . [START ON 12/05/2013] hydrochlorothiazide  12.5 mg Oral Daily  . metoprolol succinate  25 mg Oral BID  . pantoprazole  40 mg Oral Daily  . simvastatin  40 mg Oral q1800  . sodium chloride  3 mL Intravenous Q12H  . tamsulosin  0.4 mg Oral Daily  . verapamil  180 mg Oral QHS   Infusions:  . heparin 1,700 Units/hr (12/03/13 2330)    Assessment: 58 yo male with afib s/p cath is currently on therapeutic heparin.  Heparin level is 0.31.  Hgb 14.3 and Plt 155 K Goal of Therapy:  Heparin level 0.3-0.7  units/ml Monitor platelets by anticoagulation protocol: Yes   Plan:  1) Continue heparin at 1700 units/hr 2) Heparin level in am  So, Tsz-Yin 12/04/2013,8:47 AM

## 2013-12-04 NOTE — Progress Notes (Signed)
Tele showed 14 beats VT then return to SR 70.  Pt sleeping, awoke easily to touch, denies complaints, unaware of event.  BP 112/59.  Dr Elias Else informed, Mg level added to AM labs, will draw early if any more VT.

## 2013-12-04 NOTE — Progress Notes (Signed)
Pt states breathing better this am.  Did see episode of sleep apnea with o2 sat dropping to 79%.  O2 off, 97% on RA.  IV lasix given per PA order.  SR 68 w/ freq missed beat.

## 2013-12-05 DIAGNOSIS — I10 Essential (primary) hypertension: Secondary | ICD-10-CM

## 2013-12-05 DIAGNOSIS — I422 Other hypertrophic cardiomyopathy: Secondary | ICD-10-CM

## 2013-12-05 DIAGNOSIS — E119 Type 2 diabetes mellitus without complications: Secondary | ICD-10-CM

## 2013-12-05 DIAGNOSIS — Z9861 Coronary angioplasty status: Secondary | ICD-10-CM

## 2013-12-05 DIAGNOSIS — I509 Heart failure, unspecified: Secondary | ICD-10-CM

## 2013-12-05 DIAGNOSIS — E785 Hyperlipidemia, unspecified: Secondary | ICD-10-CM

## 2013-12-05 DIAGNOSIS — E669 Obesity, unspecified: Secondary | ICD-10-CM

## 2013-12-05 DIAGNOSIS — I2510 Atherosclerotic heart disease of native coronary artery without angina pectoris: Secondary | ICD-10-CM

## 2013-12-05 DIAGNOSIS — I679 Cerebrovascular disease, unspecified: Secondary | ICD-10-CM

## 2013-12-05 LAB — CBC
HCT: 41.2 % (ref 39.0–52.0)
Hemoglobin: 14.1 g/dL (ref 13.0–17.0)
MCH: 31.1 pg (ref 26.0–34.0)
MCHC: 34.2 g/dL (ref 30.0–36.0)
MCV: 90.9 fL (ref 78.0–100.0)
Platelets: 160 10*3/uL (ref 150–400)
RBC: 4.53 MIL/uL (ref 4.22–5.81)
RDW: 13 % (ref 11.5–15.5)
WBC: 8.8 10*3/uL (ref 4.0–10.5)

## 2013-12-05 LAB — BASIC METABOLIC PANEL
Anion gap: 13 (ref 5–15)
BUN: 14 mg/dL (ref 6–23)
CO2: 25 mEq/L (ref 19–32)
Calcium: 8.6 mg/dL (ref 8.4–10.5)
Chloride: 100 mEq/L (ref 96–112)
Creatinine, Ser: 1.14 mg/dL (ref 0.50–1.35)
GFR calc Af Amer: 80 mL/min — ABNORMAL LOW (ref >90)
GFR calc non Af Amer: 69 mL/min — ABNORMAL LOW (ref >90)
Glucose, Bld: 119 mg/dL — ABNORMAL HIGH (ref 70–99)
Potassium: 3.5 mEq/L — ABNORMAL LOW (ref 3.7–5.3)
Sodium: 138 mEq/L (ref 137–147)

## 2013-12-05 LAB — HEPARIN LEVEL (UNFRACTIONATED): Heparin Unfractionated: 0.24 IU/mL — ABNORMAL LOW (ref 0.30–0.70)

## 2013-12-05 LAB — MAGNESIUM: Magnesium: 2 mg/dL (ref 1.5–2.5)

## 2013-12-05 MED ORDER — POTASSIUM CHLORIDE CRYS ER 20 MEQ PO TBCR
40.0000 meq | EXTENDED_RELEASE_TABLET | Freq: Once | ORAL | Status: AC
  Administered 2013-12-05: 10:00:00 40 meq via ORAL
  Filled 2013-12-05: qty 2

## 2013-12-05 MED ORDER — FUROSEMIDE 20 MG PO TABS
20.0000 mg | ORAL_TABLET | Freq: Every day | ORAL | Status: DC
  Administered 2013-12-05: 10:00:00 20 mg via ORAL
  Filled 2013-12-05: qty 1

## 2013-12-05 MED ORDER — METOPROLOL SUCCINATE ER 25 MG PO TB24: 25.0000 mg | ORAL_TABLET | Freq: Every day | ORAL | Status: DC

## 2013-12-05 MED ORDER — NITROGLYCERIN 0.4 MG SL SUBL: 0.4000 mg | SUBLINGUAL_TABLET | SUBLINGUAL | Status: DC | PRN

## 2013-12-05 MED ORDER — FUROSEMIDE 20 MG PO TABS: 20.0000 mg | ORAL_TABLET | Freq: Every day | ORAL | Status: DC

## 2013-12-05 NOTE — Progress Notes (Signed)
ANTICOAGULATION CONSULT NOTE - Follow Up Consult  Pharmacy Consult for heparin Indication: atrial fibrillation  Labs:  Recent Labs  12/02/13 0845 12/02/13 1236 12/02/13 1904  12/03/13 0455 12/03/13 1031 12/04/13 0327 12/04/13 0706 12/05/13 0318  HGB  --   --   --   < > 14.1  --  14.3  --  14.1  HCT  --   --   --   --  40.4  --  42.3  --  41.2  PLT  --   --   --   --  156  --  155  --  160  LABPROT  --  15.2  --   --   --  15.3*  --   --   --   INR  --  1.20  --   --   --  1.21  --   --   --   HEPARINUNFRC  --  0.22*  --   < > 0.42  --   --  0.31 0.24*  CREATININE  --   --   --   --  1.07  --  1.16  --   --   TROPONINI 0.41* <0.30 <0.30  --   --   --   --   --   --   < > = values in this interval not displayed.   Assessment: 58yo male now subtherapeutic on heparin after three levels at goal though had been trending down.  Goal of Therapy:  Heparin level 0.3-0.7 units/ml   Plan:  Will increase heparin gtt by 1-2 units/kg/hr to 1900 units/hr and check level in 6hr.  Wynona Neat, PharmD, BCPS  12/05/2013,4:13 AM

## 2013-12-05 NOTE — Progress Notes (Signed)
Patient ID: Peter Dunlap, male   DOB: [birth year], 58 y.o.   MRN: [id number] 58 y.o. male with PMH of PVD, HTN and HCVD, obesity, & moderate to severe 2 Vessel CAD by catheterization in 2012 -- treated medically.  He presented to [facility] ER 12/02/13 with ~1 week of worsening exertional dyspnea with mild chest pressure.  He was also noted to be in Afib. He ruled In for a NSTEMI. Echo shows HOCM with a 26mmHg gradient and EF of 70-75% with a moderate pericardial effusion. Cath done 12/03/13 showed progression of CAD and he underwent LAD DES. He has residual RCA/PD/PL bifurcation disease to be treated medically. Post PCI he had recurrent PAF with RVR. Heparin resumed. He will need chronic anticoagulation. He has sleep apnea by history as well and will need a sleep study as an OP.  Referred for cardiac cath for mild Troponin elevation -- 80-90% LAD DES stent.  Subjective:  Did well once he converted ton NSR.  NO CP or SOB.  Has ambulated up & down the hall without problems.  Objective:  Vital Signs in the last 24 hours: Temp:  [98 F (36.7 C)-98.7 F (37.1 C)] 98.6 F (37 C) (07/03 0732) Pulse Rate:  [66-80] 76 (07/03 0732) Resp:  [18-20] 18 (07/03 0732) BP: (96-143)/(41-100) 113/82 mmHg (07/03 0732) SpO2:  [96 %-98 %] 96 % (07/03 0732) Weight:  [318 lb 5.5 oz (144.4 kg)] 318 lb 5.5 oz (144.4 kg) (07/03 0014)  Intake/Output from previous day:  Intake/Output Summary (Last 24 hours) at 12/05/13 0759 Last data filed at 12/05/13 0359  Gross per 24 hour  Intake    548 ml  Output   1500 ml  Net   -952 ml    Physical Exam: General appearance: alert, cooperative, no distress and morbidly obese Lungs: clear to auscultation bilaterally, non-labored Heart: regular rate and rhythm and 2/6 systolic murmur, @ S4, occasional ectopy Abd: Obese, soft, NT, ND, NABS Extremities: Rt wrist without hematoma   Rate: 70s  Rhythm: normal sinus rhythm with PACs (occasionally blocked).  14 Beats  of NSVT  Lab Results:  Recent Labs  12/04/13 0327 12/05/13 0318  WBC 8.7 8.8  HGB 14.3 14.1  PLT 155 160    Recent Labs  12/04/13 0327 12/05/13 0318  NA 138 138  K 4.2 3.5*  CL 101 100  CO2 24 25  GLUCOSE 111* 119*  BUN 12 14  CREATININE 1.16 1.14    Recent Labs  12/02/13 1236 12/02/13 1904  TROPONINI <0.30 <0.30    Recent Labs  12/03/13 1031  INR 1.21    Imaging: Imaging results have been reviewed  Cardiac Studies:  Assessment/Plan:   Principal Problem:   NSTEMI (non-ST elevated myocardial infarction) Active Problems:   Arteriosclerotic cardiovascular disease (ASCVD)   Diabetes mellitus type 2 in obese   Hypertrophic cardiomyopathy   GERD   PSVT (paroxysmal supraventricular tachycardia)   Hypertension   Hyperlipidemia   PAF (paroxysmal atrial fibrillation)   Pericardial effusion without cardiac tamponade   Obesity (BMI 30-39.9)   Sleep apnea by history  PLAN:   Patient seen and examined. Agree with assessment and plan. Increasing sob last night. Breathing better now following IV furosemide. Now back in NSR. S/P DES stent 2 d ago. HOCM with 53 mm LVOT gradient. Moderate pericardial effusion without hemodynamic compromise on echo.  Now on DAPT  Currently maintaining NSR, but did have short run of VT last PM.  Agree with not going  to Triple therapy for now -- in 1 month can d/c ASA & consider full anticoagulation for Afib.    With VT run last PM & HOCM - will BB dose just increased to 25 mg BID yesterday;  d/c HCTZ & use Lasix 20 mg as diuretic.   Continue statin. PPI while on DAPT.  D/c Today - plan is ROV with Dr. Lovena Le.   Leonie Man, M.D., M.S. Interventional Cardiologist   Pager # [phone] 12/05/2013

## 2013-12-05 NOTE — Discharge Summary (Signed)
Pt Seen this AM.  Stable for d/c.  Agree with summary.  See last PN for details.   F/u with Dr. Lovena Le.  Leonie Man, M.D., M.S. Interventional Cardiologist   Pager # [phone] 12/05/2013

## 2013-12-05 NOTE — Discharge Instructions (Signed)
SLEEP STUDY - GET MD TO ARRANGE AS OUTPATIENT.  PLEASE REMEMBER TO BRING ALL OF YOUR MEDICATIONS TO EACH OF YOUR FOLLOW-UP OFFICE VISITS.  PLEASE ATTEND ALL SCHEDULED FOLLOW-UP APPOINTMENTS.   Activity: Increase activity slowly as tolerated. You may shower, but no soaking baths (or swimming) for 1 week. No driving for 2 days. No lifting over 5 lbs for 1 week. No sexual activity for 1 week.   You May Return to Work: in 1 week (if applicable)  Wound Care: You may wash cath site gently with soap and water. Keep cath site clean and dry. If you notice pain, swelling, bleeding or pus at your cath site, please call [phone].    Cardiac Cath Site Care Refer to this sheet in the next few weeks. These instructions provide you with information on caring for yourself after your procedure. Your caregiver may also give you more specific instructions. Your treatment has been planned according to current medical practices, but problems sometimes occur. Call your caregiver if you have any problems or questions after your procedure. HOME CARE INSTRUCTIONS  You may shower 24 hours after the procedure. Remove the bandage (dressing) and gently wash the site with plain soap and water. Gently pat the site dry.   Do not apply powder or lotion to the site.   Do not sit in a bathtub, swimming pool, or whirlpool for 5 to 7 days.   No bending, squatting, or lifting anything over 10 pounds (4.5 kg) as directed by your caregiver.   Inspect the site at least twice daily.   Do not drive home if you are discharged the same day of the procedure. Have someone else drive you.   You may drive 24 hours after the procedure unless otherwise instructed by your caregiver.  What to expect:  Any bruising will usually fade within 1 to 2 weeks.   Blood that collects in the tissue (hematoma) may be painful to the touch. It should usually decrease in size and tenderness within 1 to 2 weeks.  SEEK IMMEDIATE MEDICAL CARE  IF:  You have unusual pain at the site or down the affected limb.   You have redness, warmth, swelling, or pain at the site.   You have drainage (other than a small amount of blood on the dressing).   You have chills.   You have a fever or persistent symptoms for more than 72 hours.   You have a fever and your symptoms suddenly get worse.   Your leg becomes pale, cool, tingly, or numb.   You have heavy bleeding from the site. Hold pressure on the site.  Document Released: 06/24/2010 Document Revised: 05/11/2011 Document Reviewed: 06/24/2010 [facility] Patient Information 2012 [place].

## 2013-12-05 NOTE — Discharge Summary (Signed)
CARDIOLOGY DISCHARGE SUMMARY   Patient ID: Peter Dunlap MRN: [id number] DOB/AGE: 58/12/1955 58 y.o.  Admit date: 12/02/2013 Discharge date: 12/05/2013  PCP: Maricela Curet, MD Primary Cardiologist:  Dr. Lovena Le  Primary Discharge Diagnosis:   NSTEMI (non-ST elevated myocardial infarction) - Promus Premier DES 3.0 mm x 16 mm to the LAD  Secondary Discharge Diagnosis:    GERD   PSVT (paroxysmal supraventricular tachycardia)   Hypertension   Arteriosclerotic cardiovascular disease (ASCVD)   Hyperlipidemia   Hypertrophic cardiomyopathy   PAF (paroxysmal atrial fibrillation)   Pericardial effusion without cardiac tamponade   Diabetes mellitus type 2 in obese   Obesity (BMI 30-39.9)   Sleep apnea by history  Procedures: Cardiac catheterization, coronary arteriogram, left ventriculogram, PTCA and drug-eluting stent to the LAD, 2-D echocardiogram  Hospital Course: Peter Dunlap is a 58 y.o. male with a history of nonobstructive CAD by cath in 2012. He had increasing dyspnea on exertion as well as a substernal burning sensation. He came to the emergency room and he was in atrial fibrillation. He was also volume overloaded. He was admitted for further evaluation and treatment.  He had some elevation in his troponin indicating a NSTEMI. He was diuresed with IV Lasix. As his lites status improved, his respiratory status improved. He was stable from a respiratory standpoint and was taken to the cath lab on 07/01.  Cardiac catheterization results are below. He had a drug-eluting stent to the LAD. His tibia on dual antiplatelet therapy for 12 months. A P2Y12 test is to be checked at followup.  He continued to have paroxysmal atrial fibrillation during his admission. He admits to occasional palpitations as an outpatient. He was placed on heparin while in the hospital in addition to aspirin and Plavix. A 2-D echocardiogram was performed which showed a moderate circumferential  pericardial effusion. It was felt that once he was otherwise medically stable, after diuresis and stenting, if he maintained sinus rhythm, we would prefer not to add oral anticoagulation at this time because of the pericardial effusion. His beta blocker was increased. He is to be on aspirin and Plavix for a month, then the aspirin will be discontinued and oral anticoagulation initiated.  There was concern for sleep apnea and a sleep study can be set up as an outpatient.  Hemoglobin A1c was checked and was elevated at 6.4. He is encouraged to stick to a low carbohydrate diabetic diet and followup with primary care.   He was seen by cardiac rehabilitation and educated on MI restrictions, stent guidelines, heart healthy lifestyle modifications and exercise guidelines. It is but he will followup with cardiac rehabilitation as an outpatient.  On 07/03, he was seen by Dr. Ellyn Hack and all data were reviewed. He is on aspirin, Plavix, statin and beta blocker. His volume status is good on Lasix instead of HCTZ. His respiratory status is stable and he is ambulating without chest pain or shortness of breath. No further inpatient workup is indicated and he is considered stable for discharge, to follow up as an outpatient.  Labs:   Lab Results  Component Value Date   WBC 8.8 12/05/2013   HGB 14.1 12/05/2013   HCT 41.2 12/05/2013   MCV 90.9 12/05/2013   PLT 160 12/05/2013     Recent Labs Lab 12/05/13 0318  NA 138  K 3.5*  CL 100  CO2 25  BUN 14  CREATININE 1.14  CALCIUM 8.6  GLUCOSE 119*   Troponin I  Date Value Ref Range  Status  12/02/2013 <0.30  <0.30 ng/mL Final  12/02/2013 <0.30  <0.30 ng/mL Final  12/02/2013 0.41* <0.30 ng/mL Final  12/02/2013 0.48* <0.30 ng/mL Final  12/02/2013 0.40* <0.30 ng/mL Final   Lipid Panel     Component Value Date/Time   CHOL 100 12/03/2013 1030   TRIG 87 12/03/2013 1030   HDL 29* 12/03/2013 1030   CHOLHDL 3.4 12/03/2013 1030   VLDL 17 12/03/2013 1030   LDLCALC 54 12/03/2013  1030    Pro B Natriuretic peptide (BNP)  Date/Time Value Ref Range Status  12/02/2013 12:39 AM 955.4* 0 - 125 pg/mL Final  01/31/2013  5:50 AM 448.0* 0 - 125 pg/mL Final    Recent Labs  12/03/13 1031  INR 1.21   Lab Results  Component Value Date   HGBA1C 6.4* 12/02/2013      Radiology: Dg Chest Portable 1 View 12/02/2013   CLINICAL DATA:  Shortness of breath.  Cough  EXAM: PORTABLE CHEST - 1 VIEW  COMPARISON:  01/31/2013  FINDINGS: There is mild cardiac enlargement. A small amount of fluid is noted within the minor fissure. There is pulmonary vascular congestion. No airspace consolidation. Lung volumes are low. The visualized skeletal structures are unremarkable.  IMPRESSION: Cardiac enlargement and pulmonary vascular congestion.   Electronically Signed   By: Kerby Moors M.D.   On: 12/02/2013 01:15    Cardiac Cath: 12/03/2013 FINDINGS:  Hemodynamics:  Central Aortic Pressure / Mean: Prelim 67/48/56 mmHg; 138/95/114 mmHg following IVF bolus Left Ventricular Pressure / LVEDP: 70/10/16 mmHg Left Ventriculography: Deferred - recent Echo with EF 70-75%, Hypertropic LVH with outflow tract pressure gradient.  Coronary Anatomy:  Dominance: Right Left Main: Normal caliber vessel with mild calcification, bifurcates into the Left Anterior Descending Artery (LAD) and Circumflex (Cx) LAD: normal caliber vessel with a small diagonal branch and major septal perforator proximally. There is a short ~40% stenosis followed by a short "normal" segment just up to the First Diagonal (D1). Just beyond D1, there is an eccentric, focal 80-90% stenosis. Beyond this stenosis, the LAD gives of a moderate to large caliber mid Vessel D3 that is larger than the quite small caliber "Septal LAD".  D2: Small to moderate caliber vessel just proximal to the LAD lesion. Mild luminal Irregularities.  D3: Moderate caliber vessel that perfuses the mid to distal anterolateral wall. Minimal luminal irregularities.  Left  Circumflex: Normal caliber vessel with diffuse mild disease, but no angiographically notable lesion. It bifurcates into OM1 and the AV Groove Cx that terminates as OM2 with a diminutive Left Posterolateral Branch (LPL).  OM1: Moderate caliber vessel; minimal luminal irregularities.  OM 2: Small to moderate caliber vessel that tapers into a diminutive branch distallyl; minimal luminal irregularities RCA: Large caliber, dominant vessel with a focal 40-50% proximal lesion. The Mid vessel is angiographically normal.  The distal RCA bifurcates into the Right Posterior Descending Artery (RPDA) and the Right Posterior AV Groove Branch (RPAV). There is a stable ~60-70% lesion in essentially a 1,1,1 configuration involving the "ostial" portion of each branch. Each branch has minimal disease beyond the ostial lesions. There is a smaller secondary RPDA with focal ~70% ostial stenosis from the main RPDA.  After reviewing the initial angiography, the culprit lesion was thought to be 80-90% mid LAD lesion at D2. Preparation were made to proceed with PCI on this lesion.  Percutaneous Coronary Intervention: Sheath exchanged for 6 Fr  Guide: 6 Fr XBLAD Guidewire: ProWater  Predilation Balloon: Emerge 2.0 mm x 12 mm;  10 Atm x 20 Sec, Stent: Promus Premier DES 3.0 mm x 16 mm;  Max Inflation: 16 Atm x 30 Sec, Post-dilation Balloon: [place] Emerge 3.25 mm x 12 mm;  14 Atm x 30 Sec, 16 Atm x 45 Sec (mid & proximal stent)  Final Diameter: 3.3 mm Post deployment angiography in multiple views, with and without guidewire in place revealed excellent stent deployment and lesion coverage. There was no evidence of dissection or perforation. POST-OPERATIVE DIAGNOSIS:  Severe mid LAD ~80-90% stenosis just after D1 and large SP trunk; distal LAD gives off major midLAD D2 that is larger that the distal LAD. Distal LAD reaches the apex. D2 covers entire anterolateral wall.  Successful PCI of mid LAD with Promus DES Stable distal  RCA-PDA-PL bifurcation ~60-70% stenosis and diffuse mild disease in Cx-OM1-OM2  Hyperdynamic LVEF with mild to moderately elevated LVEDP. PLAN OF CARE:  Transfer to [facility] for standard Post Radial Cath PCI care. DAPT x minimum of 1 year (has been on Plavix chronically - Check p2Y 12 at f/u visit)  Continue current regimen.   EKG: 04-Dec-2013 17:04:39    Atrial fibrillation with slow ventricular response Left ventricular hypertrophy ST & T wave abnormality,inferior and anterolateral  areas  Prolonged QT Vent. rate 59 BPM PR interval * ms QRS duration 98 ms QT/QTc 488/483 ms P-R-T axes * 19 152  Echo: 12/02/2013 Conclusions - Left ventricle: The cavity size was mildly reduced. Wall thickness was increased in a pattern of severe LVH. There is mitral chordal systolic anterior motion. 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 Systolic function was vigorous. The estimated ejection fraction was in the range of 70% to 75%. There was dynamic obstruction at restin the outflow tract, with a peak velocity of 365 cm/sec and a peak gradient of 53 mm Hg. Wall motion was normal; there were no regional wall motion abnormalities. Doppler parameters are consistent with abnormal left ventricular relaxation (grade 1 diastolic dysfunction). Doppler parameters are consistent with elevated ventricular end-diastolic filling pressure. - Aortic valve: Mildly to moderately calcified annulus. Trileaflet; mildly calcified leaflets. Cusp separation was mildly reduced. There was no significant regurgitation. - Mitral valve: Mildly thickened leaflets . There was trivial regurgitation. - Left atrium: The atrium was mildly dilated. - Tricuspid valve: There was trivial regurgitation. - Pulmonary arteries: Systolic pressure could not be accurately estimated. - Inferior vena cava: Not visualized. Unable to estimate CVP. - Pericardium, extracardiac: A moderate  pericardial effusion was identified circumferential to the heart. Impressions: - Reduced LV chamber size with severe LVH consistent with hypertensive or hypertrophic cardiomyopathy. There is mitral chordal SAM with increased LVOT gradient as noted, LVEF 70-75% and grade 1 diastolic dysfunction with increased filling pressures. Mild left atrial enlargement. Sclerotic aortic valve without stenosis. Trivial tricuspid regurgitation, unable to assess PASP. Moderate sized, circumferential pericardial effusion without clear evidence of tamponade physiology.   FOLLOW UP PLANS AND APPOINTMENTS Allergies  Allergen Reactions  . Lactose Intolerance (Gi)      Medication List    STOP taking these medications       hydrochlorothiazide 12.5 MG capsule  Commonly known as:  MICROZIDE      TAKE these medications       aspirin EC 81 MG tablet  Take 81 mg by mouth daily.     clopidogrel 75 MG tablet  Commonly known as:  PLAVIX  Take 75 mg by mouth daily.     cycloSPORINE 0.05 % ophthalmic emulsion  Commonly known as:  RESTASIS  Place 1 drop into both eyes 2 (two) times daily.     DEXILANT 60 MG capsule  Generic drug:  dexlansoprazole  Take 60 mg by mouth daily.     furosemide 20 MG tablet  Commonly known as:  LASIX  Take 1 tablet (20 mg total) by mouth daily.     metoprolol succinate 25 MG 24 hr tablet  Commonly known as:  TOPROL-XL  Take 1 tablet (25 mg total) by mouth daily.     naproxen 500 MG tablet  Commonly known as:  NAPROSYN  Take 500 mg by mouth 2 (two) times daily as needed (pain).     nitroGLYCERIN 0.4 MG SL tablet  Commonly known as:  NITROSTAT  Place 1 tablet (0.4 mg total) under the tongue every 5 (five) minutes as needed for chest pain.     RAPAFLO 8 MG Caps capsule  Generic drug:  silodosin  Take 8 mg by mouth daily.     simvastatin 40 MG tablet  Commonly known as:  ZOCOR  Take 40 mg by mouth daily.     tamsulosin 0.4 MG Caps capsule  Commonly known as:   FLOMAX  Take 0.4 mg by mouth daily.     verapamil 180 MG CR tablet  Commonly known as:  CALAN-SR  Take 180 mg by mouth at bedtime.        Discharge Instructions   (HEART FAILURE PATIENTS) Call MD:  Anytime you have any of the following symptoms: 1) 3 pound weight gain in 24 hours or 5 pounds in 1 week 2) shortness of breath, with or without a dry hacking cough 3) swelling in the hands, feet or stomach 4) if you have to sleep on extra pillows at night in order to breathe.    Complete by:  As directed      Amb Referral to Cardiac Rehabilitation    Complete by:  As directed      Diet - low sodium heart healthy    Complete by:  As directed      Diet Carb Modified    Complete by:  As directed      Increase activity slowly    Complete by:  As directed           Follow-up Information   Follow up with [name], MD. (The office will call.)    Specialty:  Cardiology   Contact information:   [address] [address] [phone]       BRING ALL MEDICATIONS WITH YOU TO FOLLOW UP APPOINTMENTS  Time spent with patient to include physician time: 38 min Signed: Rosaria Ferries, PA-C 12/05/2013, 9:21 AM Co-Sign MD

## 2013-12-07 ENCOUNTER — Encounter (HOSPITAL_COMMUNITY): Payer: Self-pay | Admitting: Emergency Medicine

## 2013-12-07 ENCOUNTER — Emergency Department (HOSPITAL_COMMUNITY): Payer: PRIVATE HEALTH INSURANCE

## 2013-12-07 ENCOUNTER — Emergency Department (HOSPITAL_COMMUNITY)
Admission: EM | Admit: 2013-12-07 | Discharge: 2013-12-07 | Disposition: A | Discharge status: Home Health | Payer: PRIVATE HEALTH INSURANCE | Attending: Emergency Medicine | Admitting: Emergency Medicine

## 2013-12-07 DIAGNOSIS — N400 Enlarged prostate without lower urinary tract symptoms: Secondary | ICD-10-CM | POA: Insufficient documentation

## 2013-12-07 DIAGNOSIS — Z87891 Personal history of nicotine dependence: Secondary | ICD-10-CM | POA: Diagnosis not present

## 2013-12-07 DIAGNOSIS — Z7901 Long term (current) use of anticoagulants: Secondary | ICD-10-CM | POA: Diagnosis not present

## 2013-12-07 DIAGNOSIS — I209 Angina pectoris, unspecified: Secondary | ICD-10-CM | POA: Diagnosis not present

## 2013-12-07 DIAGNOSIS — R0602 Shortness of breath: Secondary | ICD-10-CM | POA: Diagnosis present

## 2013-12-07 DIAGNOSIS — I2510 Atherosclerotic heart disease of native coronary artery without angina pectoris: Secondary | ICD-10-CM | POA: Diagnosis not present

## 2013-12-07 DIAGNOSIS — IMO0001 Reserved for inherently not codable concepts without codable children: Secondary | ICD-10-CM | POA: Diagnosis present

## 2013-12-07 DIAGNOSIS — Z7902 Long term (current) use of antithrombotics/antiplatelets: Secondary | ICD-10-CM | POA: Diagnosis not present

## 2013-12-07 DIAGNOSIS — Z8659 Personal history of other mental and behavioral disorders: Secondary | ICD-10-CM | POA: Insufficient documentation

## 2013-12-07 DIAGNOSIS — K219 Gastro-esophageal reflux disease without esophagitis: Secondary | ICD-10-CM | POA: Diagnosis not present

## 2013-12-07 DIAGNOSIS — I249 Acute ischemic heart disease, unspecified: Secondary | ICD-10-CM

## 2013-12-07 DIAGNOSIS — I471 Supraventricular tachycardia, unspecified: Secondary | ICD-10-CM

## 2013-12-07 DIAGNOSIS — I319 Disease of pericardium, unspecified: Secondary | ICD-10-CM | POA: Insufficient documentation

## 2013-12-07 DIAGNOSIS — R0600 Dyspnea, unspecified: Secondary | ICD-10-CM

## 2013-12-07 DIAGNOSIS — Z79899 Other long term (current) drug therapy: Secondary | ICD-10-CM | POA: Diagnosis not present

## 2013-12-07 DIAGNOSIS — R0989 Other specified symptoms and signs involving the circulatory and respiratory systems: Secondary | ICD-10-CM | POA: Diagnosis not present

## 2013-12-07 DIAGNOSIS — I509 Heart failure, unspecified: Secondary | ICD-10-CM | POA: Diagnosis not present

## 2013-12-07 DIAGNOSIS — I214 Non-ST elevation (NSTEMI) myocardial infarction: Secondary | ICD-10-CM

## 2013-12-07 DIAGNOSIS — R0609 Other forms of dyspnea: Secondary | ICD-10-CM | POA: Insufficient documentation

## 2013-12-07 DIAGNOSIS — I252 Old myocardial infarction: Secondary | ICD-10-CM | POA: Diagnosis not present

## 2013-12-07 DIAGNOSIS — Z9889 Other specified postprocedural states: Secondary | ICD-10-CM | POA: Diagnosis not present

## 2013-12-07 DIAGNOSIS — I200 Unstable angina: Secondary | ICD-10-CM | POA: Insufficient documentation

## 2013-12-07 DIAGNOSIS — Z8673 Personal history of transient ischemic attack (TIA), and cerebral infarction without residual deficits: Secondary | ICD-10-CM | POA: Insufficient documentation

## 2013-12-07 DIAGNOSIS — Z8739 Personal history of other diseases of the musculoskeletal system and connective tissue: Secondary | ICD-10-CM | POA: Insufficient documentation

## 2013-12-07 DIAGNOSIS — I4891 Unspecified atrial fibrillation: Secondary | ICD-10-CM

## 2013-12-07 DIAGNOSIS — Z720 Tobacco use: Secondary | ICD-10-CM

## 2013-12-07 DIAGNOSIS — E669 Obesity, unspecified: Secondary | ICD-10-CM

## 2013-12-07 DIAGNOSIS — Z7982 Long term (current) use of aspirin: Secondary | ICD-10-CM | POA: Insufficient documentation

## 2013-12-07 DIAGNOSIS — E785 Hyperlipidemia, unspecified: Secondary | ICD-10-CM | POA: Diagnosis not present

## 2013-12-07 DIAGNOSIS — R079 Chest pain, unspecified: Secondary | ICD-10-CM | POA: Diagnosis not present

## 2013-12-07 DIAGNOSIS — I10 Essential (primary) hypertension: Secondary | ICD-10-CM | POA: Insufficient documentation

## 2013-12-07 DIAGNOSIS — Z9861 Coronary angioplasty status: Secondary | ICD-10-CM | POA: Diagnosis not present

## 2013-12-07 DIAGNOSIS — I480 Paroxysmal atrial fibrillation: Secondary | ICD-10-CM

## 2013-12-07 DIAGNOSIS — Z791 Long term (current) use of non-steroidal anti-inflammatories (NSAID): Secondary | ICD-10-CM | POA: Diagnosis not present

## 2013-12-07 DIAGNOSIS — E119 Type 2 diabetes mellitus without complications: Secondary | ICD-10-CM | POA: Insufficient documentation

## 2013-12-07 DIAGNOSIS — R002 Palpitations: Secondary | ICD-10-CM | POA: Diagnosis present

## 2013-12-07 DIAGNOSIS — M129 Arthropathy, unspecified: Secondary | ICD-10-CM | POA: Insufficient documentation

## 2013-12-07 HISTORY — DX: Non-ST elevation (NSTEMI) myocardial infarction: I21.4

## 2013-12-07 LAB — I-STAT TROPONIN, ED: Troponin i, poc: 0.13 ng/mL (ref 0.00–0.08)

## 2013-12-07 LAB — PROTIME-INR
INR: 1.14 (ref 0.00–1.49)
Prothrombin Time: 14.6 seconds (ref 11.6–15.2)

## 2013-12-07 LAB — BASIC METABOLIC PANEL
Anion gap: 18 — ABNORMAL HIGH (ref 5–15)
BUN: 16 mg/dL (ref 6–23)
CO2: 20 mEq/L (ref 19–32)
Calcium: 9.2 mg/dL (ref 8.4–10.5)
Chloride: 100 mEq/L (ref 96–112)
Creatinine, Ser: 1.16 mg/dL (ref 0.50–1.35)
GFR calc Af Amer: 78 mL/min — ABNORMAL LOW (ref >90)
GFR calc non Af Amer: 68 mL/min — ABNORMAL LOW (ref >90)
Glucose, Bld: 118 mg/dL — ABNORMAL HIGH (ref 70–99)
Potassium: 3.6 mEq/L — ABNORMAL LOW (ref 3.7–5.3)
Sodium: 138 mEq/L (ref 137–147)

## 2013-12-07 LAB — CBC
HCT: 43.6 % (ref 39.0–52.0)
Hemoglobin: 15.7 g/dL (ref 13.0–17.0)
MCH: 32.4 pg (ref 26.0–34.0)
MCHC: 36 g/dL (ref 30.0–36.0)
MCV: 89.9 fL (ref 78.0–100.0)
Platelets: 178 10*3/uL (ref 150–400)
RBC: 4.85 MIL/uL (ref 4.22–5.81)
RDW: 12.8 % (ref 11.5–15.5)
WBC: 9.2 10*3/uL (ref 4.0–10.5)

## 2013-12-07 LAB — TROPONIN I
Troponin I: <0.3 ng/mL (ref <0.30)
Troponin I: <0.3 ng/mL (ref <0.30)

## 2013-12-07 LAB — PRO B NATRIURETIC PEPTIDE: Pro B Natriuretic peptide (BNP): 980.3 pg/mL — ABNORMAL HIGH (ref 0–125)

## 2013-12-07 MED ORDER — METOPROLOL SUCCINATE ER 25 MG PO TB24
25.0000 mg | ORAL_TABLET | Freq: Every day | ORAL | Status: DC
  Administered 2013-12-07: 25 mg via ORAL
  Filled 2013-12-07: qty 1

## 2013-12-07 MED ORDER — CLOPIDOGREL BISULFATE 75 MG PO TABS
75.0000 mg | ORAL_TABLET | Freq: Once | ORAL | Status: AC
  Administered 2013-12-07: 75 mg via ORAL
  Filled 2013-12-07: qty 1

## 2013-12-07 MED ORDER — VERAPAMIL HCL ER 180 MG PO TBCR
180.0000 mg | EXTENDED_RELEASE_TABLET | Freq: Every day | ORAL | Status: DC
  Filled 2013-12-07: qty 1

## 2013-12-07 MED ORDER — ASPIRIN 81 MG PO CHEW
324.0000 mg | CHEWABLE_TABLET | Freq: Once | ORAL | Status: AC
  Administered 2013-12-07: 324 mg via ORAL
  Filled 2013-12-07: qty 4

## 2013-12-07 MED ORDER — FUROSEMIDE 20 MG PO TABS
20.0000 mg | ORAL_TABLET | Freq: Every day | ORAL | Status: DC
  Administered 2013-12-07: 20 mg via ORAL
  Filled 2013-12-07: qty 1

## 2013-12-07 NOTE — ED Provider Notes (Signed)
CSN: [id number]     Arrival date & time 12/07/13  0402 History   First MD Initiated Contact with Patient 12/07/13 0441     Chief Complaint  Patient presents with  . Shortness of Breath  . Chest Pain     (Consider location/radiation/quality/duration/timing/severity/associated sxs/prior Treatment) HPI  Past Medical History  Diagnosis Date  . Hypertension     Severe LVH with normal EF  . Arteriosclerotic cardiovascular disease (ASCVD) 2005    catheterization in 10/2010:50% mid LAD, diffuse distal disease, circumflex irregularities, large dominant RCA with a 50% ostial, 70% distal, 60% posterolateral and 70% PDA; normal EF  . Cerebrovascular disease 2010    R. carotid endarterectomy; Duplex in 10/2010-widely patent ICAs, subtotal left vertebral-not thought to be contributing to symptoms  . Hyperlipidemia   . Obesity   . Tobacco abuse     Quit 2014  . Benign prostatic hypertrophy   . Low back pain   . History of PSVT (paroxysmal supraventricular tachycardia) 2005    Diagnosed with ILR; no recurrence following RFA  . Cervical spine disease     CT in 2012-advanced degeneration and spondylosis with moderate spinal stenosis at C3-C6  . H/O: substance abuse     Cocaine, marijuana, alcohol.  Quit 2013.   [name] Syncope   . Gastroesophageal reflux disease   . Depression   . Erectile dysfunction   . Coronary artery disease   . Myocardial infarction   . Anginal pain   . Shortness of breath   . CHF (congestive heart failure)   . H/O hiatal hernia   . Headache(784.0)   . Arthritis   . Carotid artery occlusion   . Pericardial effusion without cardiac tamponade 12/03/2013  . Diabetes mellitus type 2 in obese 12/03/2013   Past Surgical History  Procedure Laterality Date  . Radiofrequency ablation  2005    for PSVT  . Cardiac catheterization    . Carotid endarterectomy Right Feb. 25, 2010     CEA   Family History  Problem Relation Age of Onset  . Hypertension Mother     Cerebrovascular  disease  . Diabetes Mother   . Coronary artery disease Father 66  . Diabetes type II Father   . Hypertension Father   . Heart attack Father   . Lung cancer Paternal Uncle   . Diabetes Sister   . Hypertension Sister   . Heart attack Sister 26  . Diabetes Brother   . Hypertension Brother    History  Substance Use Topics  . Smoking status: Former Smoker -- 1.00 packs/day for 40 years    Types: Cigarettes    Quit date: 10/10/2012  . Smokeless tobacco: Never Used     Comment: Quit in May.   . Alcohol Use: No    Review of Systems    Allergies  Lactose intolerance (gi)  Home Medications   Prior to Admission medications   Medication Sig Start Date End Date Taking? Authorizing Provider  aspirin EC 81 MG tablet Take 81 mg by mouth daily.   Yes Historical Provider, MD  clopidogrel (PLAVIX) 75 MG tablet Take 75 mg by mouth daily.    Yes Historical Provider, MD  cycloSPORINE (RESTASIS) 0.05 % ophthalmic emulsion Place 1 drop into both eyes 2 (two) times daily.   Yes Historical Provider, MD  dexlansoprazole (DEXILANT) 60 MG capsule Take 60 mg by mouth daily.   Yes Historical Provider, MD  furosemide (LASIX) 20 MG tablet Take 1 tablet (20 mg total)  by mouth daily. 12/05/13  Yes Rhonda G Barrett, PA-C  metoprolol succinate (TOPROL-XL) 25 MG 24 hr tablet Take 1 tablet (25 mg total) by mouth daily. 12/05/13  Yes Rhonda G Barrett, PA-C  naproxen (NAPROSYN) 500 MG tablet Take 500 mg by mouth 2 (two) times daily as needed (pain).   Yes Historical Provider, MD  nitroGLYCERIN (NITROSTAT) 0.4 MG SL tablet Place 1 tablet (0.4 mg total) under the tongue every 5 (five) minutes as needed for chest pain. 12/05/13  Yes Rhonda G Barrett, PA-C  RAPAFLO 8 MG CAPS capsule Take 8 mg by mouth daily.  10/21/13  Yes Historical Provider, MD  simvastatin (ZOCOR) 40 MG tablet Take 40 mg by mouth daily.    Yes Historical Provider, MD  tamsulosin (FLOMAX) 0.4 MG CAPS capsule Take 0.4 mg by mouth daily.   Yes Historical  Provider, MD  verapamil (CALAN-SR) 180 MG CR tablet Take 180 mg by mouth at bedtime.   Yes Historical Provider, MD   BP 139/74  Pulse 71  Temp(Src) 98.1 F (36.7 C) (Oral)  Resp 22  Ht 6' (1.829 m)  Wt 318 lb (144.244 kg)  BMI 43.12 kg/m2  SpO2 98% Physical Exam  ED Course  Procedures (including critical care time) Labs Review Labs Reviewed  BASIC METABOLIC PANEL - Abnormal; Notable for the following:    Potassium 3.6 (*)    Glucose, Bld 118 (*)    GFR calc non Af Amer 68 (*)    GFR calc Af Amer 78 (*)    Anion gap 18 (*)    All other components within normal limits  PRO B NATRIURETIC PEPTIDE - Abnormal; Notable for the following:    Pro B Natriuretic peptide (BNP) 980.3 (*)    All other components within normal limits  I-STAT TROPOININ, ED - Abnormal; Notable for the following:    Troponin i, poc 0.13 (*)    All other components within normal limits  CBC  PROTIME-INR  TROPONIN I  TROPONIN I    Imaging Review Dg Chest Port 1 View  12/07/2013   CLINICAL DATA:  Shortness of breath and chest pain  EXAM: PORTABLE CHEST - 1 VIEW  COMPARISON:  12/02/2013  FINDINGS: There is chronic cardiopericardial enlargement. When accounting for hypoaeration, no edema, consolidation, effusion, or pneumothorax. No acute osseous findings. Remote right coracoclavicular ligament injury with heterotopic ossification.  IMPRESSION: Low volume lungs.  No evidence of edema or pneumonia.   Electronically Signed   By: Jorje Guild M.D.   On: 12/07/2013 05:11     EKG Interpretation   Date/Time:  Sunday December 07 2013 04:31:43 EDT Ventricular Rate:  59 PR Interval:    QRS Duration: 101 QT Interval:  494 QTC Calculation: 489 R Axis:   -11 Text Interpretation:  Atrial fibrillation LVH with secondary  repolarization abnormality Anterior ST elevation, probably due to LVH  Borderline prolonged QT interval Unchanged Confirmed by Kathrynn Humble, MD,  Thelma Comp [phone]) on 12/07/2013 4:40:40 AM      MDM    Final diagnoses:  ACS (acute coronary syndrome)  Angina pectoris  Dyspnea    Pt comes to the ER with cc of dib and chest pain. Recent NSTEMI with LAD stenting. Pt also has known CAD that is being medically managed. Pt is feeling a lot better, and asymptomatic since arrival.  Regular troponin is normal at arrival, ekg shows no acute findings.  Cardiology consulted. Dr. Zoila Shutter, Cards wanted patient to get 2nd enzyme and for [place] to ambulate  him in the ER and see how he does, and give them an update. I have paged Cards again - given them the update - and someone from Cardiology team will come and reassess patient, and help with the disposition.  Dr. Reather Converse will follow up on 2nd troponin, and Cards recommendation.    Varney Biles, MD 12/07/13 [phone]

## 2013-12-07 NOTE — ED Provider Notes (Signed)
Patient signed out to me to followup cardiology recommendations. Cardiology evaluated the patient and recommended increasing Lasix for 3 days and followup outpatient in the clinic.  Peter Dunlap   Peter Clonts, MD 12/07/13 [phone]

## 2013-12-07 NOTE — ED Notes (Signed)
Pt reports chest pressure and sob onset last pm, pt sts that he had stent placed to 90 % blockage last week.

## 2013-12-07 NOTE — ED Notes (Signed)
States he feels better and is ready to go hiome

## 2013-12-07 NOTE — Consult Note (Signed)
CARDIOLOGY CONSULT NOTE   Patient ID: Peter Dunlap MRN: [id number] DOB/AGE: Mar 03, 1956 58 y.o.  Admit date: 12/07/2013  Primary Physician   DONDIEGO,RICHARD Jerilynn Mages, MD Primary Cardiologist   Dr. Lovena Le Reason for Consultation   chest pain  CNO:[id number] [name] is a 58 y.o. male with a history of CAD.  He was here with a non-STEMI, peak troponin 0.48. He had a 90% LAD that was treated with a drug-eluting stent. He was discharged on 12/05/2013.  Last p.m., he was sitting on the porch watching the fireworks, when his 80 pound dog became very agitated. He was having to significantly exert himself in order to keep his dog under control. He developed shortness of breath and a fluttering in his chest, the same symptoms for which he came in the hospital several days ago.  He took a sublingual nitroglycerin, which gave him a headache but his symptoms did not resolve. He did not feel he could sleep. He came to the emergency room at approximately 4 AM. In the emergency room, his initial ECG showed atrial fibrillation. He was given aspirin 324 mg, Plavix 75 mg, Toprol-XL 25 mg and Lasix 20 mg by mouth. He is currently in sinus rhythm, and the fluttering has resolved. He is asymptomatic.   Past Medical History  Diagnosis Date  . Hypertension     Severe LVH with normal EF  . Arteriosclerotic cardiovascular disease (ASCVD) 2005    catheterization in 10/2010:50% mid LAD, diffuse distal disease, circumflex irregularities, large dominant RCA with a 50% ostial, 70% distal, 60% posterolateral and 70% PDA; normal EF  . Cerebrovascular disease 2010    R. carotid endarterectomy; Duplex in 10/2010-widely patent ICAs, subtotal left vertebral-not thought to be contributing to symptoms  . Hyperlipidemia   . Obesity   . Tobacco abuse     Quit 2014  . Benign prostatic hypertrophy   . Low back pain   . History of PSVT (paroxysmal supraventricular tachycardia) 2005    Diagnosed with ILR; no recurrence following  RFA  . Cervical spine disease     CT in 2012-advanced degeneration and spondylosis with moderate spinal stenosis at C3-C6  . H/O: substance abuse     Cocaine, marijuana, alcohol.  Quit 2013.   [name] Syncope   . Gastroesophageal reflux disease   . Depression   . Erectile dysfunction   . Coronary artery disease   . Myocardial infarction   . Anginal pain   . Shortness of breath   . CHF (congestive heart failure)   . H/O hiatal hernia   . Headache(784.0)   . Arthritis   . Carotid artery occlusion   . Pericardial effusion without cardiac tamponade 12/03/2013  . Diabetes mellitus type 2 in obese 12/03/2013  . Non-ST elevation myocardial infarction (NSTEMI), initial episode of care 12/02/2013    DES LAD     Past Surgical History  Procedure Laterality Date  . Radiofrequency ablation  2005    for PSVT  . Coronary angioplasty with stent placement  12/03/2013    LAD 90%-->0% W/ Promus Premier DES 3.0 mm x 16 mm, CFX OK, RCA 40%, EF 70-75%  . Carotid endarterectomy Right Feb. 25, 2010     CEA   Allergies  Allergen Reactions  . Lactose Intolerance (Gi)     I have reviewed the patient's current medications . furosemide  20 mg Oral Daily  . metoprolol succinate  25 mg Oral Daily  . verapamil  180 mg Oral QHS  Prior to Admission medications   Medication Sig Start Date End Date Taking? Authorizing Provider  aspirin EC 81 MG tablet Take 81 mg by mouth daily.   Yes Historical Provider, MD  clopidogrel (PLAVIX) 75 MG tablet Take 75 mg by mouth daily.    Yes Historical Provider, MD  cycloSPORINE (RESTASIS) 0.05 % ophthalmic emulsion Place 1 drop into both eyes 2 (two) times daily.   Yes Historical Provider, MD  dexlansoprazole (DEXILANT) 60 MG capsule Take 60 mg by mouth daily.   Yes Historical Provider, MD  furosemide (LASIX) 20 MG tablet Take 1 tablet (20 mg total) by mouth daily. 12/05/13  Yes Rhonda G Barrett, PA-C  metoprolol succinate (TOPROL-XL) 25 MG 24 hr tablet Take 1 tablet (25 mg  total) by mouth daily. 12/05/13  Yes Rhonda G Barrett, PA-C  naproxen (NAPROSYN) 500 MG tablet Take 500 mg by mouth 2 (two) times daily as needed (pain).   Yes Historical Provider, MD  nitroGLYCERIN (NITROSTAT) 0.4 MG SL tablet Place 1 tablet (0.4 mg total) under the tongue every 5 (five) minutes as needed for chest pain. 12/05/13  Yes Rhonda G Barrett, PA-C  RAPAFLO 8 MG CAPS capsule Take 8 mg by mouth daily.  10/21/13  Yes Historical Provider, MD  simvastatin (ZOCOR) 40 MG tablet Take 40 mg by mouth daily.    Yes Historical Provider, MD  tamsulosin (FLOMAX) 0.4 MG CAPS capsule Take 0.4 mg by mouth daily.   Yes Historical Provider, MD  verapamil (CALAN-SR) 180 MG CR tablet Take 180 mg by mouth at bedtime.   Yes Historical Provider, MD     History   Social History  . Marital Status: Legally Separated    Spouse Name: N/A    Number of Children: 0  . Years of Education: N/A   Occupational History  . Retired    Social History Main Topics  . Smoking status: Former Smoker -- 1.00 packs/day for 40 years    Types: Cigarettes    Quit date: 10/10/2012  . Smokeless tobacco: Never Used     Comment: Quit in May.   . Alcohol Use: No  . Drug Use: No     Comment: quit cocaine 10/2011  . Sexual Activity: No   Other Topics Concern  . Not on file   Social History Narrative   Lives in [place].    Family Status  Relation Status Death Age  . Mother Deceased     Cerebrovascular disease, hypertension  . Father Deceased 88    acute MI   Family History  Problem Relation Age of Onset  . Hypertension Mother     Cerebrovascular disease  . Diabetes Mother   . Coronary artery disease Father 24  . Diabetes type II Father   . Hypertension Father   . Heart attack Father   . Lung cancer Paternal Uncle   . Diabetes Sister   . Hypertension Sister   . Heart attack Sister 22  . Diabetes Brother   . Hypertension Brother      ROS:  Full 14 point review of systems complete and found to be negative  unless listed above.  Physical Exam: Blood pressure 133/58, pulse 73, temperature 98.1 F (36.7 C), temperature source Oral, resp. rate 4, height 6' (1.829 m), weight 318 lb (144.244 kg), SpO2 100.00%.  General: Well developed, well nourished, male in no acute distress Head: Eyes PERRLA, No xanthomas.   Normocephalic and atraumatic, oropharynx without edema or exudate. Dentition: Poor Lungs: Clear bilaterally  Heart: HRRR S1 S2, no rub/gallop, no murmur. pulses are 2+ all 4 extrem.   Neck: No carotid bruits. No lymphadenopathy.  JVD not elevated. Abdomen: Bowel sounds present, abdomen soft and non-tender without masses or hernias noted. Msk:  No spine or cva tenderness. No weakness, no joint deformities or effusions. Extremities: No clubbing or cyanosis. No edema. Cath site right wrist is without ecchymosis or hematoma Neuro: Alert and oriented X 3. No focal deficits noted. Psych:  Good affect, responds appropriately Skin: No rashes or lesions noted.  Labs:   Lab Results  Component Value Date   WBC 9.2 12/07/2013   HGB 15.7 12/07/2013   HCT 43.6 12/07/2013   MCV 89.9 12/07/2013   PLT 178 12/07/2013    Recent Labs  12/07/13 0425  INR 1.14     Recent Labs Lab 12/07/13 0425  NA 138  K 3.6*  CL 100  CO2 20  BUN 16  CREATININE 1.16  CALCIUM 9.2  GLUCOSE 118*   Magnesium  Date Value Ref Range Status  12/05/2013 2.0  1.5 - 2.5 mg/dL Final    Recent Labs  12/07/13 0450 12/07/13 0730  TROPONINI <0.30 <0.30    Recent Labs  12/07/13 0433  TROPIPOC 0.13*   Pro B Natriuretic peptide (BNP)  Date/Time Value Ref Range Status  12/07/2013  4:25 AM 980.3* 0 - 125 pg/mL Final  12/02/2013 12:39 AM 955.4* 0 - 125 pg/mL Final   Lab Results  Component Value Date   CHOL 100 12/03/2013   HDL 29* 12/03/2013   LDLCALC 54 12/03/2013   TRIG 87 12/03/2013   ECG:  07-Dec-2013 04:31:43 Atrial fibrillation LVH with secondary repolarization abnormality Anterior ST elevation, probably due to  LVH Borderline prolonged QT interval Vent. rate 59 BPM PR interval * ms QRS duration 101 ms QT/QTc 494/489 ms P-R-T axes -1 -11 155  Radiology:  Dg Chest Port 1 View 12/07/2013   CLINICAL DATA:  Shortness of breath and chest pain  EXAM: PORTABLE CHEST - 1 VIEW  COMPARISON:  12/02/2013  FINDINGS: There is chronic cardiopericardial enlargement. When accounting for hypoaeration, no edema, consolidation, effusion, or pneumothorax. No acute osseous findings. Remote right coracoclavicular ligament injury with heterotopic ossification.  IMPRESSION: Low volume lungs.  No evidence of edema or pneumonia.   Electronically Signed   By: Jorje Guild M.D.   On: 12/07/2013 05:11    ASSESSMENT AND PLAN:   The patient was seen today by Dr. Marlou Porch, the patient evaluated and the data reviewed.  Principal Problem:   Shortness of breath dyspnea - BNP up slightly but no significant extra volume by physical exam or CXR, Required 1 dose IV Lasix during previous admission, could increase PO Lasix 20 mg to 40 mg x 3 days, to make sure no extra volume on board. Encourage daily weights.  Active Problems:   Heart palpitations - pt was in atrial fib on admission.    SignedRosaria Ferries, PA-C 12/07/2013 9:01 AM Beeper [phone]  Co-Sign MD

## 2013-12-07 NOTE — ED Notes (Signed)
Initial EKG completed in the room at the bedside and unable to locate in Muse or Monitor. Repeated and given to Dr Kathrynn Humble

## 2013-12-07 NOTE — Discharge Instructions (Signed)
See cardiology as previously discussed with cardiology. Take an extra 20 mg of Lasix for the next 3 days. Return to the ER if you develop different or worsening chest pain or new concerns.  OK to take an extra metoprolol (Toprol XL) 25 mg daily if you get fluttering in your chest.  If you were given medicines take as directed.  If you are on coumadin or contraceptives realize their levels and effectiveness is altered by many different medicines.  If you have any reaction (rash, tongues swelling, other) to the medicines stop taking and see a physician.   Please follow up as directed and return to the ER or see a physician for new or worsening symptoms.  Thank you. Filed Vitals:   12/07/13 0730 12/07/13 0757 12/07/13 0800 12/07/13 0900  BP: 157/75 157/75 133/58 107/81  Pulse: 73 67 73 71  Temp:      TempSrc:      Resp: 16  4 11   Height:      Weight:      SpO2: 98%  100% 100%

## 2013-12-07 NOTE — Consult Note (Signed)
Personally seen and examined. Agree with above.  LAD stent, NSTEMI recent DC.  Troponins currently normal. Symptoms mainly of palpitations, shortness of breath with paroxysmal atrial fibrillation. Currently sinus rhythm however he has had paroxysmal atrial fibrillation in the past. This has been associated at times with some bradycardia.  Recommendation is to discharge home. If palpitations occur synonymous with paroxysmal atrial fibrillation and his heart rate appears to be elevated, he has liberty to take an extra metoprolol and lay down. If the symptoms continue and become more troublesome, EP referral for possible dofetilide load would be reasonable. Since he has coronary artery disease, class IC agents are not permitted.  Also, BNP mildly elevated. Increase Lasix over the next 3 days. Continue to encourage weight loss.  Okay with discharge from emergency room.  Candee Furbish, MD

## 2013-12-22 ENCOUNTER — Encounter: Payer: Self-pay | Admitting: *Deleted

## 2013-12-24 ENCOUNTER — Encounter: Payer: Self-pay | Admitting: Internal Medicine

## 2013-12-24 ENCOUNTER — Ambulatory Visit (INDEPENDENT_AMBULATORY_CARE_PROVIDER_SITE_OTHER): Payer: PRIVATE HEALTH INSURANCE | Admitting: Internal Medicine

## 2013-12-24 ENCOUNTER — Encounter (INDEPENDENT_AMBULATORY_CARE_PROVIDER_SITE_OTHER): Payer: Self-pay

## 2013-12-24 VITALS — BP 110/72 | HR 72 | Ht 72.0 in | Wt 308.0 lb

## 2013-12-24 DIAGNOSIS — I313 Pericardial effusion (noninflammatory): Secondary | ICD-10-CM

## 2013-12-24 DIAGNOSIS — I319 Disease of pericardium, unspecified: Secondary | ICD-10-CM

## 2013-12-24 DIAGNOSIS — Z720 Tobacco use: Secondary | ICD-10-CM

## 2013-12-24 DIAGNOSIS — E669 Obesity, unspecified: Secondary | ICD-10-CM

## 2013-12-24 DIAGNOSIS — F17200 Nicotine dependence, unspecified, uncomplicated: Secondary | ICD-10-CM

## 2013-12-24 DIAGNOSIS — I2510 Atherosclerotic heart disease of native coronary artery without angina pectoris: Secondary | ICD-10-CM

## 2013-12-24 DIAGNOSIS — I4891 Unspecified atrial fibrillation: Secondary | ICD-10-CM

## 2013-12-24 DIAGNOSIS — I480 Paroxysmal atrial fibrillation: Secondary | ICD-10-CM

## 2013-12-24 DIAGNOSIS — I3139 Other pericardial effusion (noninflammatory): Secondary | ICD-10-CM

## 2013-12-24 MED ORDER — CLOPIDOGREL BISULFATE 75 MG PO TABS: ORAL_TABLET | ORAL | Status: DC

## 2013-12-24 MED ORDER — WARFARIN SODIUM 5 MG PO TABS: ORAL_TABLET | ORAL | Status: DC

## 2013-12-24 NOTE — Assessment & Plan Note (Signed)
He is encouraged to lose weight. He has already lost 20 lbs in the past month.

## 2013-12-24 NOTE — Assessment & Plan Note (Signed)
He is trying to stop smoking.

## 2013-12-24 NOTE — Patient Instructions (Signed)
Your physician recommends that you schedule a follow-up appointment in: 2 months with Dr. Lovena Le  Please schedule patient an appointment with the [facility] for January 19, 2014  Your physician has recommended you make the following change in your medication: Continue Clopidogrel 75 mg by mouth daily until January 12, 2014 and then stop. On January 13, 2014 start Warfarin 5 mg by mouth daily.  You have prescription for this to take to your pharmacy.

## 2013-12-24 NOTE — Assessment & Plan Note (Signed)
He is currently in atrial fib. His last ECG in [facility] was in early July. I have asked the patient to start coumadin once he stops his plavix in 3 weeks. He will continue low dose ASA.

## 2013-12-24 NOTE — Progress Notes (Signed)
HPI [name] returns today for followup. He is a pleasant middle aged man with a h/o tobacco abuse, SVT, s/p catheter ablation, HTN and atrial fibrillation. He underwent insertion of a non-drug eluting stent to the LAD when he presented with chest pain and was found to have an 80% LAD stenosis. He still has dyspnea with exertion. He denies chest pain, syncope but also had mild peripheral edema. He was last in NSR in early July. He does not have palpitations. On echo during his previous admission, he was noted to have a moderate circumferential pericardial effusion. Allergies  Allergen Reactions  . Lactose Intolerance (Gi)      Current Outpatient Prescriptions  Medication Sig Dispense Refill  . aspirin EC 81 MG tablet Take 81 mg by mouth daily.      . clopidogrel (PLAVIX) 75 MG tablet Take 75 mg by mouth daily.       [name] dexlansoprazole (DEXILANT) 60 MG capsule Take 60 mg by mouth daily.      . furosemide (LASIX) 20 MG tablet Take 1 tablet (20 mg total) by mouth daily.  30 tablet  11  . hydrochlorothiazide (MICROZIDE) 12.5 MG capsule Take 12.5 mg by mouth daily.      . metoprolol succinate (TOPROL-XL) 25 MG 24 hr tablet Take 1 tablet (25 mg total) by mouth daily.  60 tablet  11  . nitroGLYCERIN (NITROSTAT) 0.4 MG SL tablet Place 1 tablet (0.4 mg total) under the tongue every 5 (five) minutes as needed for chest pain.  25 tablet  12  . RAPAFLO 8 MG CAPS capsule Take 8 mg by mouth daily.       . simvastatin (ZOCOR) 40 MG tablet Take 40 mg by mouth daily.       . verapamil (CALAN-SR) 180 MG CR tablet Take 180 mg by mouth at bedtime.      . cycloSPORINE (RESTASIS) 0.05 % ophthalmic emulsion Place 1 drop into both eyes 2 (two) times daily.       No current facility-administered medications for this visit.     Past Medical History  Diagnosis Date  . Hypertension     Severe LVH with normal EF  . Arteriosclerotic cardiovascular disease (ASCVD) 2005    catheterization in 10/2010:50% mid  LAD, diffuse distal disease, circumflex irregularities, large dominant RCA with a 50% ostial, 70% distal, 60% posterolateral and 70% PDA; normal EF  . Cerebrovascular disease 2010    R. carotid endarterectomy; Duplex in 10/2010-widely patent ICAs, subtotal left vertebral-not thought to be contributing to symptoms  . Hyperlipidemia   . Obesity   . Tobacco abuse     Quit 2014  . Benign prostatic hypertrophy   . Low back pain   . History of PSVT (paroxysmal supraventricular tachycardia) 2005    Diagnosed with ILR; no recurrence following RFA  . Cervical spine disease     CT in 2012-advanced degeneration and spondylosis with moderate spinal stenosis at C3-C6  . H/O: substance abuse     Cocaine, marijuana, alcohol.  Quit 2013.   [name] Syncope   . Gastroesophageal reflux disease   . Depression   . Erectile dysfunction   . Coronary artery disease   . Myocardial infarction   . Anginal pain   . Shortness of breath   . CHF (congestive heart failure)   . H/O hiatal hernia   . Headache(784.0)   . Arthritis   . Carotid artery occlusion   . Pericardial effusion without cardiac  tamponade 12/03/2013  . Diabetes mellitus type 2 in obese 12/03/2013  . Non-ST elevation myocardial infarction (NSTEMI), initial episode of care 12/02/2013    DES LAD    ROS:   All systems reviewed and negative except as noted in the HPI.   Past Surgical History  Procedure Laterality Date  . Radiofrequency ablation  2005    for PSVT  . Coronary angioplasty with stent placement  12/03/2013    LAD 90%-->0% W/ Promus Premier DES 3.0 mm x 16 mm, CFX OK, RCA 40%, EF 70-75%  . Carotid endarterectomy Right Feb. 25, 2010     CEA     Family History  Problem Relation Age of Onset  . Hypertension Mother     Cerebrovascular disease  . Diabetes Mother   . Coronary artery disease Father 13  . Diabetes type II Father   . Hypertension Father   . Heart attack Father   . Lung cancer Paternal Uncle   . Diabetes Sister   .  Hypertension Sister   . Heart attack Sister 59  . Diabetes Brother   . Hypertension Brother      History   Social History  . Marital Status: Legally Separated    Spouse Name: N/A    Number of Children: 0  . Years of Education: N/A   Occupational History  . Retired    Social History Main Topics  . Smoking status: Former Smoker -- 1.00 packs/day for 40 years    Types: Cigarettes    Quit date: 10/10/2012  . Smokeless tobacco: Never Used     Comment: Quit in May.   . Alcohol Use: No  . Drug Use: No     Comment: quit cocaine 10/2011  . Sexual Activity: No   Other Topics Concern  . Not on file   Social History Narrative   Lives in [place].     BP 110/72  Pulse 72  Ht 6' (1.829 m)  Wt 308 lb (139.708 kg)  BMI 41.76 kg/m2  Physical Exam:  Well appearing middle aged man, looks older than stated age, NAD HEENT: Unremarkable Neck:  7 cm JVD, no thyromegally Back:  No CVA tenderness Lungs:  Clear except for basilar rales HEART:  Regular rate rhythm, no murmurs, no rubs, no clicks Abd:  soft, positive bowel sounds, no organomegally, no rebound, no guarding Ext:  2 plus pulses, no edema, no cyanosis, no clubbing Skin:  No rashes no nodules Neuro:  CN II through XII intact, motor grossly intact  EKG - atrial fib with a controlled VR   Assess/Plan:

## 2013-12-24 NOTE — Assessment & Plan Note (Signed)
He will need repeat echo in several weeks. Will follow.

## 2014-01-01 ENCOUNTER — Encounter (HOSPITAL_COMMUNITY): Payer: Self-pay

## 2014-01-01 ENCOUNTER — Encounter (HOSPITAL_COMMUNITY)
Admission: RE | Admit: 2014-01-01 | Discharge: 2014-01-01 | Disposition: A | Discharge status: Home / Self Care | Payer: PRIVATE HEALTH INSURANCE | Source: Ambulatory Visit | Attending: Internal Medicine | Admitting: Internal Medicine

## 2014-01-01 VITALS — BP 98/62 | HR 77 | Ht 72.0 in | Wt 303.2 lb

## 2014-01-01 DIAGNOSIS — I214 Non-ST elevation (NSTEMI) myocardial infarction: Secondary | ICD-10-CM

## 2014-01-01 DIAGNOSIS — Z9861 Coronary angioplasty status: Secondary | ICD-10-CM

## 2014-01-01 NOTE — Patient Instructions (Signed)
Pt has finished orientation and is scheduled to start CR on 01/05/14 at 9:30am. Pt has been instructed to arrive to class 15 minutes early for scheduled class. Pt has been instructed to wear comfortable clothing and shoes with rubber soles. Pt has been told to take their medications 1 hour prior to coming to class.  If the patient is not going to attend class, he/she has been instructed to call.

## 2014-01-01 NOTE — Progress Notes (Signed)
Patient was referred to CR by Dr. Crissie Sickles due to NSTEMI 410.91and Stent placement V45.82. During orientation advised patient on arrival and appointment times what to wear, what to do before, during and after exercise. Reviewed attendance and class policy. Talked about inclement weather and class consultation policy. Pt is scheduled to start Cardiac Rehab on 01/05/14 at 9:30. Pt was advised to come to class 5 minutes before class starts. He was also given instructions on meeting with the dietician and attending the Family Structure classes. Pt is eager to get started. Patient was only able to complete 3 minutes of 6 minute walk test due to SOB and chest pain (4-5 on pain scale). Contacted heart doctor concerning the patient chest pain. Spoke with Dr. Tanna Furry nurse. She said he could get started on Monday.

## 2014-01-05 ENCOUNTER — Encounter: Payer: Self-pay | Admitting: Family Medicine

## 2014-01-05 ENCOUNTER — Ambulatory Visit (INDEPENDENT_AMBULATORY_CARE_PROVIDER_SITE_OTHER): Payer: 59 | Admitting: Family Medicine

## 2014-01-05 ENCOUNTER — Encounter (HOSPITAL_COMMUNITY)
Admission: RE | Admit: 2014-01-05 | Discharge: 2014-01-05 | Disposition: A | Discharge status: Home / Self Care | Payer: PRIVATE HEALTH INSURANCE | Source: Ambulatory Visit | Attending: Internal Medicine | Admitting: Internal Medicine

## 2014-01-05 VITALS — BP 128/68 | HR 78 | Temp 98.1°F | Resp 14 | Ht 70.5 in | Wt 303.0 lb

## 2014-01-05 DIAGNOSIS — I2510 Atherosclerotic heart disease of native coronary artery without angina pectoris: Secondary | ICD-10-CM | POA: Diagnosis not present

## 2014-01-05 DIAGNOSIS — K219 Gastro-esophageal reflux disease without esophagitis: Secondary | ICD-10-CM

## 2014-01-05 DIAGNOSIS — E119 Type 2 diabetes mellitus without complications: Secondary | ICD-10-CM | POA: Diagnosis not present

## 2014-01-05 DIAGNOSIS — N400 Enlarged prostate without lower urinary tract symptoms: Secondary | ICD-10-CM

## 2014-01-05 DIAGNOSIS — Z87891 Personal history of nicotine dependence: Secondary | ICD-10-CM | POA: Insufficient documentation

## 2014-01-05 DIAGNOSIS — I4891 Unspecified atrial fibrillation: Secondary | ICD-10-CM

## 2014-01-05 DIAGNOSIS — G4730 Sleep apnea, unspecified: Secondary | ICD-10-CM

## 2014-01-05 DIAGNOSIS — I214 Non-ST elevation (NSTEMI) myocardial infarction: Secondary | ICD-10-CM | POA: Diagnosis present

## 2014-01-05 DIAGNOSIS — I10 Essential (primary) hypertension: Secondary | ICD-10-CM | POA: Insufficient documentation

## 2014-01-05 DIAGNOSIS — I509 Heart failure, unspecified: Secondary | ICD-10-CM | POA: Diagnosis not present

## 2014-01-05 DIAGNOSIS — I679 Cerebrovascular disease, unspecified: Secondary | ICD-10-CM

## 2014-01-05 DIAGNOSIS — E1169 Type 2 diabetes mellitus with other specified complication: Secondary | ICD-10-CM

## 2014-01-05 DIAGNOSIS — I7090 Unspecified atherosclerosis: Secondary | ICD-10-CM

## 2014-01-05 DIAGNOSIS — I480 Paroxysmal atrial fibrillation: Secondary | ICD-10-CM

## 2014-01-05 DIAGNOSIS — E785 Hyperlipidemia, unspecified: Secondary | ICD-10-CM

## 2014-01-05 DIAGNOSIS — Z23 Encounter for immunization: Secondary | ICD-10-CM

## 2014-01-05 DIAGNOSIS — E669 Obesity, unspecified: Secondary | ICD-10-CM

## 2014-01-05 LAB — MICROALBUMIN / CREATININE URINE RATIO
Creatinine, Urine: 173.2 mg/dL
Microalb Creat Ratio: 2.9 mg/g (ref 0.0–30.0)
Microalb, Ur: 0.51 mg/dL (ref 0.00–1.89)

## 2014-01-05 MED ORDER — ESOMEPRAZOLE MAGNESIUM 40 MG PO CPDR: 40.0000 mg | DELAYED_RELEASE_CAPSULE | Freq: Every day | ORAL | Status: DC

## 2014-01-05 MED ORDER — BLOOD GLUCOSE MONITORING SUPPL W/DEVICE KIT: PACK | Status: DC

## 2014-01-05 NOTE — Assessment & Plan Note (Signed)
His symptoms or not controlled with a dexilant  but he cannot start Nexium until he comes off of the Plavix I will obtain his gastroenterology record for his recent EGD/Colonoscopy

## 2014-01-05 NOTE — Assessment & Plan Note (Signed)
Night sleep study to be done I think he will work require CPAP

## 2014-01-05 NOTE — Assessment & Plan Note (Signed)
He has lost 20lbs intentionally over past 3-4 months

## 2014-01-05 NOTE — Addendum Note (Signed)
Addended by: Sheral Flow on: 01/05/2014 12:59 PM   Modules accepted: Orders

## 2014-01-05 NOTE — Progress Notes (Signed)
Patient ID: Peter Dunlap, male   DOB: 1956-01-11, 58 y.o.   MRN: [id number]   Subjective:    Patient ID: Peter Dunlap, male    DOB: 09-Mar-1956, 59 y.o.   MRN: [id number]  Patient presents for New Patient CPE  patient here to establish care. His previous PCP with Dr. Cindie Laroche he is followed by cardiology Dr. Lovena Le. Gastroenterology Dr. Collene Mares  He has a significant past medical history including coronary artery disease were he recently had a NSTEMI which resulted in cardiac stent placement. He is currently on Plavix but will be transitioned to Coumadin in about 3 weeks as he also has concurrent atrial fibrillation. He is also noted to have congestive heart failure which was diagnosed a year ago she's currently on Lasix for this and has been maintaining his fluid levels. He has intentionally lost about 20 pounds over the past few months with the diagnosis of his recent heart blockage as well as a new diagnosis of diabetes mellitus.  He had recent fasting labs done in July his A1c was 6.2% at that time he does not have a glucometer he's not had a pneumonia shot that he is aware of. He was not started on any medications as he is working on dietary control.  Sleep apnea-he had episodes of hypoxia as well as apnea while he was admitted he is due for a sleep study he does not wear a CPAP  Acid reflux he is history of long-term acid reflux he did have EGD and colonoscopy done in the past. He was on Prilosec TUMS Nexium which worked best for him he is currently on [place] but this does not help  Chart reviewed for PMH,SOCIAL HISTORY   Review Of Systems:  GEN- denies fatigue, fever, weight loss,weakness, recent illness HEENT- denies eye drainage, change in vision, nasal discharge, CVS- +chest pain, palpitations RESP- denies SOB, cough, wheeze ABD- denies N/V, change in stools, abd pain GU- denies dysuria, hematuria, dribbling, incontinence MSK- + joint pain, muscle aches, injury Neuro-  denies headache, dizziness, syncope, seizure activity       Objective:    BP 128/68  Pulse 78  Temp(Src) 98.1 F (36.7 C) (Oral)  Resp 14  Ht 5' 10.5" (1.791 m)  Wt 303 lb (137.44 kg)  BMI 42.85 kg/m2 GEN- NAD, alert and oriented x3, obese HEENT- PERRL, EOMI, non injected sclera, pink conjunctiva, MMM, oropharynx clear Neck- Supple, no LAD CVS- irregular rhythm, normal rate, no murmur RESP-CTAB ABD-NABS,soft,NT,ND Psych- normal affect and mood EXT- No edema Pulses- Radial, DP- 2+        Assessment & Plan:      Problem List Items Addressed This Visit   Sleep apnea by history (Chronic)     Night sleep study to be done I think he will work require CPAP    PAF (paroxysmal atrial fibrillation)     Afib, rate controlled, will soon switch to coumadin, goal 2-3, followed by coumadin clinic    Obesity (BMI 30-39.9) (Chronic)     He has lost 20lbs intentionally over past 3-4 months     Hypertension - Primary (Chronic)     Blood pressure is well-controlled    Hyperlipidemia (Chronic)   GERD (Chronic)     His symptoms or not controlled with a dexilant  but he cannot start Nexium until he comes off of the Plavix I will obtain his gastroenterology record for his recent EGD/Colonoscopy    Relevant Medications      esomeprazole ([place])  capsule   Diabetes mellitus type 2 in obese (Chronic)     His A1c looks good at 6.2% this is diet controlled. I will check a urine microalbumin I've given him a pneumovax 23 vaccine    Relevant Orders      Microalbumin / creatinine urine ratio   Cerebrovascular disease   BENIGN PROSTATIC HYPERTROPHY   Arteriosclerotic cardiovascular disease (ASCVD)     S/p stent in July, in cardiac rehab, continues to have some anginal pain, has NTG  Lipids, HTN, DM well controlled at this time       Note: This dictation was prepared with Dragon dictation along with smaller phrase technology. Any transcriptional errors that result from this process are  unintentional.

## 2014-01-05 NOTE — Assessment & Plan Note (Signed)
His A1c looks good at 6.2% this is diet controlled. I will check a urine microalbumin I've given him a pneumovax 23 vaccine

## 2014-01-05 NOTE — Assessment & Plan Note (Signed)
Blood pressure is well controlled 

## 2014-01-05 NOTE — Assessment & Plan Note (Signed)
S/p stent in July, in cardiac rehab, continues to have some anginal pain, has NTG  Lipids, HTN, DM well controlled at this time

## 2014-01-05 NOTE — Assessment & Plan Note (Signed)
Afib, rate controlled, will soon switch to coumadin, goal 2-3, followed by coumadin clinic

## 2014-01-05 NOTE — Patient Instructions (Signed)
Pneumonia shot given Sleep study to be done Labs look good Continue current medication Aspercreme to joints You can take Tylenol arthritis RELEASE OF RECORDS- DR. Juanita Craver COLONOSCOPY/EGD YOu can start Nexium on the 11th  F/U 3 months

## 2014-01-06 ENCOUNTER — Encounter: Payer: Self-pay | Admitting: *Deleted

## 2014-01-06 ENCOUNTER — Other Ambulatory Visit: Payer: Self-pay | Admitting: *Deleted

## 2014-01-06 ENCOUNTER — Telehealth: Payer: Self-pay | Admitting: Family Medicine

## 2014-01-06 NOTE — Telephone Encounter (Signed)
[phone] Patient calling to let us know they got machine yesterday but did not get the strips would like to get these if possible  [facility]

## 2014-01-06 NOTE — Telephone Encounter (Signed)
Call placed to pharmacy, [facility]. Verbal orders given for One Touch test strips and lancets.   Call placed to patient and patient wife, Raquel Sarna made aware.

## 2014-01-07 ENCOUNTER — Encounter (HOSPITAL_COMMUNITY): Payer: PRIVATE HEALTH INSURANCE

## 2014-01-09 ENCOUNTER — Encounter (HOSPITAL_COMMUNITY): Payer: PRIVATE HEALTH INSURANCE

## 2014-01-12 ENCOUNTER — Encounter (HOSPITAL_COMMUNITY)
Admission: RE | Admit: 2014-01-12 | Discharge: 2014-01-12 | Disposition: A | Discharge status: Home / Self Care | Payer: PRIVATE HEALTH INSURANCE | Source: Ambulatory Visit | Attending: Internal Medicine | Admitting: Internal Medicine

## 2014-01-12 DIAGNOSIS — I214 Non-ST elevation (NSTEMI) myocardial infarction: Secondary | ICD-10-CM | POA: Diagnosis not present

## 2014-01-14 ENCOUNTER — Encounter (HOSPITAL_COMMUNITY): Payer: PRIVATE HEALTH INSURANCE

## 2014-01-15 ENCOUNTER — Telehealth: Payer: Self-pay | Admitting: Internal Medicine

## 2014-01-15 DIAGNOSIS — R5383 Other fatigue: Secondary | ICD-10-CM

## 2014-01-15 DIAGNOSIS — I313 Pericardial effusion (noninflammatory): Secondary | ICD-10-CM

## 2014-01-15 DIAGNOSIS — R0602 Shortness of breath: Secondary | ICD-10-CM

## 2014-01-15 DIAGNOSIS — I3139 Other pericardial effusion (noninflammatory): Secondary | ICD-10-CM

## 2014-01-15 NOTE — Telephone Encounter (Signed)
New message     Pt states that he is not feeling any better since starting cardiac rehab.  He is still having sob and tired.  Cardiac rehab nurses is aware of this.  He had a stent put in several weeks ago.  Pt want to know what does Dr Lovena Le think of this?

## 2014-01-15 NOTE — Telephone Encounter (Signed)
I spoke with the pt and he complains of SOB and fatigue.  The pt said his symptoms are keeping him from participating in cardiac rehab. The pt did switch from plavix to coumadin on 01/13/14. I discussed pt with Dr Lovena Le and he would like the pt to have echocardiogram performed.  Test scheduled on 01/21/14 and pt advised to hold off on cardiac rehab until echo results are reviewed.

## 2014-01-16 ENCOUNTER — Encounter (HOSPITAL_COMMUNITY): Payer: PRIVATE HEALTH INSURANCE

## 2014-01-19 ENCOUNTER — Ambulatory Visit (INDEPENDENT_AMBULATORY_CARE_PROVIDER_SITE_OTHER): Payer: PRIVATE HEALTH INSURANCE

## 2014-01-19 ENCOUNTER — Encounter (HOSPITAL_COMMUNITY): Payer: PRIVATE HEALTH INSURANCE

## 2014-01-19 DIAGNOSIS — I4891 Unspecified atrial fibrillation: Secondary | ICD-10-CM

## 2014-01-19 DIAGNOSIS — Z5181 Encounter for therapeutic drug level monitoring: Secondary | ICD-10-CM

## 2014-01-19 DIAGNOSIS — I480 Paroxysmal atrial fibrillation: Secondary | ICD-10-CM

## 2014-01-19 LAB — POCT INR: INR: 4.3

## 2014-01-19 NOTE — Patient Instructions (Signed)

## 2014-01-21 ENCOUNTER — Encounter (HOSPITAL_COMMUNITY): Payer: Self-pay | Admitting: Emergency Medicine

## 2014-01-21 ENCOUNTER — Encounter (HOSPITAL_COMMUNITY): Payer: PRIVATE HEALTH INSURANCE

## 2014-01-21 ENCOUNTER — Inpatient Hospital Stay (HOSPITAL_COMMUNITY)
Admission: EM | Admit: 2014-01-21 | Discharge: 2014-01-27 | DRG: 247 | Disposition: A | Discharge status: Home / Self Care | Payer: PRIVATE HEALTH INSURANCE | Attending: Internal Medicine | Admitting: Internal Medicine

## 2014-01-21 ENCOUNTER — Other Ambulatory Visit (HOSPITAL_COMMUNITY): Payer: PRIVATE HEALTH INSURANCE

## 2014-01-21 ENCOUNTER — Emergency Department (HOSPITAL_COMMUNITY): Payer: PRIVATE HEALTH INSURANCE

## 2014-01-21 DIAGNOSIS — F329 Major depressive disorder, single episode, unspecified: Secondary | ICD-10-CM | POA: Diagnosis present

## 2014-01-21 DIAGNOSIS — I471 Supraventricular tachycardia, unspecified: Secondary | ICD-10-CM | POA: Diagnosis present

## 2014-01-21 DIAGNOSIS — I4891 Unspecified atrial fibrillation: Secondary | ICD-10-CM | POA: Diagnosis present

## 2014-01-21 DIAGNOSIS — I2510 Atherosclerotic heart disease of native coronary artery without angina pectoris: Principal | ICD-10-CM | POA: Diagnosis present

## 2014-01-21 DIAGNOSIS — I200 Unstable angina: Secondary | ICD-10-CM | POA: Diagnosis not present

## 2014-01-21 DIAGNOSIS — I252 Old myocardial infarction: Secondary | ICD-10-CM

## 2014-01-21 DIAGNOSIS — E66813 Obesity, class 3: Secondary | ICD-10-CM | POA: Diagnosis present

## 2014-01-21 DIAGNOSIS — E669 Obesity, unspecified: Secondary | ICD-10-CM | POA: Diagnosis present

## 2014-01-21 DIAGNOSIS — Z9861 Coronary angioplasty status: Secondary | ICD-10-CM

## 2014-01-21 DIAGNOSIS — N400 Enlarged prostate without lower urinary tract symptoms: Secondary | ICD-10-CM | POA: Diagnosis present

## 2014-01-21 DIAGNOSIS — I422 Other hypertrophic cardiomyopathy: Secondary | ICD-10-CM | POA: Diagnosis present

## 2014-01-21 DIAGNOSIS — Z7982 Long term (current) use of aspirin: Secondary | ICD-10-CM

## 2014-01-21 DIAGNOSIS — Z6841 Body Mass Index (BMI) 40.0 and over, adult: Secondary | ICD-10-CM

## 2014-01-21 DIAGNOSIS — K219 Gastro-esophageal reflux disease without esophagitis: Secondary | ICD-10-CM | POA: Diagnosis present

## 2014-01-21 DIAGNOSIS — E119 Type 2 diabetes mellitus without complications: Secondary | ICD-10-CM | POA: Diagnosis present

## 2014-01-21 DIAGNOSIS — I110 Hypertensive heart disease with heart failure: Secondary | ICD-10-CM | POA: Diagnosis present

## 2014-01-21 DIAGNOSIS — I679 Cerebrovascular disease, unspecified: Secondary | ICD-10-CM | POA: Diagnosis present

## 2014-01-21 DIAGNOSIS — G4730 Sleep apnea, unspecified: Secondary | ICD-10-CM | POA: Diagnosis present

## 2014-01-21 DIAGNOSIS — F3289 Other specified depressive episodes: Secondary | ICD-10-CM | POA: Diagnosis present

## 2014-01-21 DIAGNOSIS — E876 Hypokalemia: Secondary | ICD-10-CM | POA: Diagnosis present

## 2014-01-21 DIAGNOSIS — Z79899 Other long term (current) drug therapy: Secondary | ICD-10-CM

## 2014-01-21 DIAGNOSIS — I3139 Other pericardial effusion (noninflammatory): Secondary | ICD-10-CM | POA: Diagnosis present

## 2014-01-21 DIAGNOSIS — E1149 Type 2 diabetes mellitus with other diabetic neurological complication: Secondary | ICD-10-CM | POA: Diagnosis present

## 2014-01-21 DIAGNOSIS — I7090 Unspecified atherosclerosis: Secondary | ICD-10-CM

## 2014-01-21 DIAGNOSIS — I319 Disease of pericardium, unspecified: Secondary | ICD-10-CM | POA: Diagnosis present

## 2014-01-21 DIAGNOSIS — I10 Essential (primary) hypertension: Secondary | ICD-10-CM | POA: Diagnosis present

## 2014-01-21 DIAGNOSIS — E1169 Type 2 diabetes mellitus with other specified complication: Secondary | ICD-10-CM | POA: Diagnosis present

## 2014-01-21 DIAGNOSIS — I480 Paroxysmal atrial fibrillation: Secondary | ICD-10-CM

## 2014-01-21 DIAGNOSIS — Z7901 Long term (current) use of anticoagulants: Secondary | ICD-10-CM | POA: Diagnosis not present

## 2014-01-21 DIAGNOSIS — E785 Hyperlipidemia, unspecified: Secondary | ICD-10-CM | POA: Diagnosis present

## 2014-01-21 DIAGNOSIS — I313 Pericardial effusion (noninflammatory): Secondary | ICD-10-CM | POA: Diagnosis present

## 2014-01-21 DIAGNOSIS — E782 Mixed hyperlipidemia: Secondary | ICD-10-CM | POA: Diagnosis present

## 2014-01-21 LAB — BASIC METABOLIC PANEL
Anion gap: 18 — ABNORMAL HIGH (ref 5–15)
BUN: 27 mg/dL — ABNORMAL HIGH (ref 6–23)
CO2: 22 mEq/L (ref 19–32)
Calcium: 9.7 mg/dL (ref 8.4–10.5)
Chloride: 97 mEq/L (ref 96–112)
Creatinine, Ser: 1.13 mg/dL (ref 0.50–1.35)
GFR calc Af Amer: 81 mL/min — ABNORMAL LOW (ref >90)
GFR calc non Af Amer: 70 mL/min — ABNORMAL LOW (ref >90)
Glucose, Bld: 119 mg/dL — ABNORMAL HIGH (ref 70–99)
Potassium: 3.4 mEq/L — ABNORMAL LOW (ref 3.7–5.3)
Sodium: 137 mEq/L (ref 137–147)

## 2014-01-21 LAB — CBC
HCT: 45.5 % (ref 39.0–52.0)
Hemoglobin: 16.3 g/dL (ref 13.0–17.0)
MCH: 31.1 pg (ref 26.0–34.0)
MCHC: 35.8 g/dL (ref 30.0–36.0)
MCV: 86.8 fL (ref 78.0–100.0)
Platelets: 237 10*3/uL (ref 150–400)
RBC: 5.24 MIL/uL (ref 4.22–5.81)
RDW: 12.1 % (ref 11.5–15.5)
WBC: 6.7 10*3/uL (ref 4.0–10.5)

## 2014-01-21 LAB — MAGNESIUM: Magnesium: 1.7 mg/dL (ref 1.5–2.5)

## 2014-01-21 LAB — I-STAT TROPONIN, ED: Troponin i, poc: 0.08 ng/mL (ref 0.00–0.08)

## 2014-01-21 LAB — MRSA PCR SCREENING: MRSA by PCR: NEGATIVE

## 2014-01-21 LAB — PRO B NATRIURETIC PEPTIDE: Pro B Natriuretic peptide (BNP): 1316 pg/mL — ABNORMAL HIGH (ref 0–125)

## 2014-01-21 LAB — PROTIME-INR
INR: 3.74 — ABNORMAL HIGH (ref 0.00–1.49)
Prothrombin Time: 37 seconds — ABNORMAL HIGH (ref 11.6–15.2)

## 2014-01-21 MED ORDER — TAMSULOSIN HCL 0.4 MG PO CAPS
0.4000 mg | ORAL_CAPSULE | Freq: Every day | ORAL | Status: DC
  Administered 2014-01-22 – 2014-01-26 (×5): 0.4 mg via ORAL
  Filled 2014-01-21 (×7): qty 1

## 2014-01-21 MED ORDER — METOPROLOL TARTRATE 25 MG PO TABS: 25.0000 mg | ORAL_TABLET | Freq: Once | ORAL | Status: DC

## 2014-01-21 MED ORDER — NITROGLYCERIN 0.4 MG SL SUBL: 0.4000 mg | SUBLINGUAL_TABLET | SUBLINGUAL | Status: DC | PRN

## 2014-01-21 MED ORDER — ATORVASTATIN CALCIUM 20 MG PO TABS
20.0000 mg | ORAL_TABLET | Freq: Every day | ORAL | Status: DC
  Administered 2014-01-21 – 2014-01-23 (×3): 20 mg via ORAL
  Filled 2014-01-21 (×4): qty 1

## 2014-01-21 MED ORDER — PANTOPRAZOLE SODIUM 40 MG PO TBEC
40.0000 mg | DELAYED_RELEASE_TABLET | Freq: Every day | ORAL | Status: DC
  Administered 2014-01-22 – 2014-01-27 (×6): 40 mg via ORAL
  Filled 2014-01-21 (×6): qty 1

## 2014-01-21 MED ORDER — SIMVASTATIN 40 MG PO TABS: 40.0000 mg | ORAL_TABLET | Freq: Every day | ORAL | Status: DC

## 2014-01-21 MED ORDER — ALPRAZOLAM 0.25 MG PO TABS
0.2500 mg | ORAL_TABLET | Freq: Two times a day (BID) | ORAL | Status: DC | PRN
Start: 2014-01-21 — End: 2014-01-27
  Administered 2014-01-21 – 2014-01-23 (×2): 0.25 mg via ORAL
  Filled 2014-01-21 (×2): qty 1

## 2014-01-21 MED ORDER — FUROSEMIDE 10 MG/ML IJ SOLN: 40.0000 mg | Freq: Once | INTRAMUSCULAR | Status: DC

## 2014-01-21 MED ORDER — ACETAMINOPHEN 325 MG PO TABS
650.0000 mg | ORAL_TABLET | ORAL | Status: DC | PRN
  Administered 2014-01-21 – 2014-01-22 (×6): 650 mg via ORAL
  Filled 2014-01-21 (×6): qty 2

## 2014-01-21 MED ORDER — CYCLOSPORINE 0.05 % OP EMUL
1.0000 [drp] | Freq: Two times a day (BID) | OPHTHALMIC | Status: DC
  Administered 2014-01-22 – 2014-01-27 (×4): 1 [drp] via OPHTHALMIC
  Filled 2014-01-21 (×13): qty 1

## 2014-01-21 MED ORDER — VERAPAMIL HCL ER 180 MG PO TBCR
180.0000 mg | EXTENDED_RELEASE_TABLET | Freq: Every day | ORAL | Status: DC
  Administered 2014-01-21 – 2014-01-26 (×6): 180 mg via ORAL
  Filled 2014-01-21 (×7): qty 1

## 2014-01-21 MED ORDER — METOPROLOL SUCCINATE ER 25 MG PO TB24
25.0000 mg | ORAL_TABLET | Freq: Every day | ORAL | Status: DC
  Administered 2014-01-22 – 2014-01-27 (×5): 25 mg via ORAL
  Filled 2014-01-21 (×6): qty 1

## 2014-01-21 MED ORDER — HYDROCHLOROTHIAZIDE 12.5 MG PO CAPS
12.5000 mg | ORAL_CAPSULE | Freq: Every day | ORAL | Status: DC
  Filled 2014-01-21: qty 1

## 2014-01-21 MED ORDER — CLOPIDOGREL BISULFATE 75 MG PO TABS
75.0000 mg | ORAL_TABLET | Freq: Once | ORAL | Status: DC
  Filled 2014-01-21: qty 1

## 2014-01-21 MED ORDER — NITROGLYCERIN 0.4 MG SL SUBL
0.4000 mg | SUBLINGUAL_TABLET | SUBLINGUAL | Status: DC | PRN
  Administered 2014-01-21: 0.4 mg via SUBLINGUAL
  Filled 2014-01-21: qty 1

## 2014-01-21 MED ORDER — POTASSIUM CHLORIDE CRYS ER 20 MEQ PO TBCR
40.0000 meq | EXTENDED_RELEASE_TABLET | Freq: Once | ORAL | Status: AC
  Administered 2014-01-21: 40 meq via ORAL
  Filled 2014-01-21: qty 2

## 2014-01-21 MED ORDER — METOPROLOL TARTRATE 1 MG/ML IV SOLN
5.0000 mg | Freq: Once | INTRAVENOUS | Status: DC
  Filled 2014-01-21: qty 5

## 2014-01-21 MED ORDER — NITROGLYCERIN IN D5W 200-5 MCG/ML-% IV SOLN
3.0000 ug/min | INTRAVENOUS | Status: DC
  Administered 2014-01-21: 3 ug/min via INTRAVENOUS
  Filled 2014-01-21: qty 250

## 2014-01-21 MED ORDER — ZOLPIDEM TARTRATE 5 MG PO TABS: 5.0000 mg | ORAL_TABLET | Freq: Every evening | ORAL | Status: DC | PRN

## 2014-01-21 MED ORDER — ASPIRIN EC 81 MG PO TBEC
81.0000 mg | DELAYED_RELEASE_TABLET | Freq: Every day | ORAL | Status: DC
  Administered 2014-01-22 – 2014-01-27 (×5): 81 mg via ORAL
  Filled 2014-01-21 (×6): qty 1

## 2014-01-21 MED ORDER — SODIUM CHLORIDE 0.9 % IV SOLN: INTRAVENOUS | Status: DC

## 2014-01-21 MED ORDER — ONDANSETRON HCL 4 MG/2ML IJ SOLN: 4.0000 mg | Freq: Four times a day (QID) | INTRAMUSCULAR | Status: DC | PRN

## 2014-01-21 NOTE — ED Notes (Signed)
Discussed with EDP the order for Metoprolol with the pt's current blood pressure. Holding medication for now. Heart rate in the mid 80's at this time.

## 2014-01-21 NOTE — ED Notes (Signed)
Pt denying CP at this time. No change in rate for Nitro drip.

## 2014-01-21 NOTE — Care Management Note (Addendum)
  Page 1 of 1   01/26/2014     3:46:11 PM CARE MANAGEMENT NOTE 01/26/2014  Patient:  Peter Dunlap, Peter Dunlap   Account Number:  [id number]  Date Initiated:  01/21/2014  Documentation initiated by:  Elissa Hefty  Subjective/Objective Assessment:   adm w ch pain     Action/Plan:   lives w wife, pcp dr [name]   Anticipated DC Date:  01/29/2014   Anticipated DC Plan:  HOME/SELF CARE         Choice offered to / List presented to:             Status of service:  Completed, signed off Medicare Important Message given?  YES (If response is "NO", the following Medicare IM given date fields will be blank) Date Medicare IM given:  01/26/2014 Medicare IM given by:  HUTCHINSON,CRYSTAL Date Additional Medicare IM given:   Additional Medicare IM given by:    Discharge Disposition:    Per UR Regulation:  Reviewed for med. necessity/level of care/duration of stay  If discussed at [place] of Stay Meetings, dates discussed:   01/27/2014    Comments:  Mariann Laster RN, BSN, MSHL, CCM  Nurse - Case Manager,  ([facility])  [phone]  01/26/2014 From home with wife Med Review: Plavix Dispo Plan:  Home / Self care.

## 2014-01-21 NOTE — Progress Notes (Signed)
ANTICOAGULATION CONSULT NOTE - Initial Consult  Pharmacy Consult for Heparin (when INR <2) Indication: chest pain/ACS/Afib while warfarin on hold  Allergies  Allergen Reactions  . Lactose Intolerance (Gi)     Patient Measurements: Height: 5' 10.47" (179 cm) Weight: 302 lb 14.6 oz (137.4 kg) IBW/kg (Calculated) : 74.09 Heparin Dosing Weight: 106 kg  Vital Signs: BP: 125/63 mmHg (08/19 1300) Pulse Rate: 77 (08/19 1300)  Labs:  Recent Labs  01/19/14 1048 01/21/14 0932 01/21/14 1254  HGB  --  16.3  --   HCT  --  45.5  --   PLT  --  237  --   LABPROT  --   --  37.0*  INR 4.3  --  3.74*  CREATININE  --  1.13  --     Estimated Creatinine Clearance: 100.2 ml/min (by C-G formula based on Cr of 1.13).   Medical History: Past Medical History  Diagnosis Date  . Hypertension     Severe LVH with normal EF  . Arteriosclerotic cardiovascular disease (ASCVD) 2005    catheterization in 10/2010:50% mid LAD, diffuse distal disease, circumflex irregularities, large dominant RCA with a 50% ostial, 70% distal, 60% posterolateral and 70% PDA; normal EF  . Cerebrovascular disease 2010    R. carotid endarterectomy; Duplex in 10/2010-widely patent ICAs, subtotal left vertebral-not thought to be contributing to symptoms  . Hyperlipidemia   . Obesity   . Tobacco abuse     Quit 2014  . Benign prostatic hypertrophy   . Low back pain   . History of PSVT (paroxysmal supraventricular tachycardia) 2005    Diagnosed with ILR; no recurrence following RFA  . Cervical spine disease     CT in 2012-advanced degeneration and spondylosis with moderate spinal stenosis at C3-C6  . H/O: substance abuse     Cocaine, marijuana, alcohol.  Quit 2013.   [name] Syncope   . Gastroesophageal reflux disease   . Depression   . Erectile dysfunction   . Coronary artery disease   . Myocardial infarction   . Anginal pain   . Shortness of breath   . CHF (congestive heart failure)   . H/O hiatal hernia   .  Headache(784.0)   . Arthritis   . Carotid artery occlusion   . Pericardial effusion without cardiac tamponade 12/03/2013  . Diabetes mellitus type 2 in obese 12/03/2013  . Non-ST elevation myocardial infarction (NSTEMI), initial episode of care 12/02/2013    DES LAD    Assessment: 29 YOM with recent PCI and onset of Afib in late June/early July and was eventually started on warfarin on 8/11 after discontinuing plavix on 8/10. The patient presented to the [facility] again today with worsening CP and was noted to be in Afib with RVR. Warfarin is being held while awaiting repeat cath (need INR to be <1.7) and pharmacy has been consulted to bridge with heparin when INR<2.   The patient's INR today is 3.74 and SUPRAtherapeutic on PTA dose of 5 mg daily EXCEPT for 2.5 mg on MWF. Will hold off on starting heparin for now and will recheck PT/INR with AM labs  Goal of Therapy:  Heparin level 0.3-0.7 units/ml Monitor platelets by anticoagulation protocol: Yes   Plan:  1. Hold initiation of heparin drip today 2. Will f/u PT/INR on 8/20 AM and plan to start heparin when INR<2 3. Daily PT/INR  [name], PharmD, BCPS Clinical Pharmacist Pager: [phone] 01/21/2014 1:26 PM

## 2014-01-21 NOTE — ED Notes (Signed)
Cardiology at bedside.

## 2014-01-21 NOTE — Care Management Note (Deleted)
Page 1 of 2   01/21/2014     2:18:36 PM Lorretta Harp, MD Physician Signed Cardiology H&P Service date: 01/21/2014 11:12 AM         Patient ID: Peter Dunlap MRN: [id number], DOB/AGE: 1955-08-30    Admit date: 01/21/2014     Primary Physician: [name], MD Primary Cardiologist: Dr Lovena Le   HPI:   Peter Dunlap is a 58 y.o. male with PMH of  moderate to severe 2 Vessel CAD by catheterization in 2012 -- treated medically. He [facility] ER 12/02/13 with ~1 week of worsening exertional dyspnea with mild chest pressure. He was also noted to be in Afib. He ruled In for NSTEMI. He underwent cath/ PCI by Dr Ellyn Hack 12/03/13 with a Promus DES to the LAD. The pt had stable distal RCA-PDA disease of 60-70%. EF was hyperdynamic. Echo done 12/02/13 showed an EF of 70-75%. There was a moderate pericardial effusion noted but no evidence of tamponade. The pt had PAF during that hospitalization and it was decided not to resume Coumadin till later secondary to his pericardia effusion. He saw Dr Lovena Le 12/24/13. The pt was instructed to stop Plavix in 3 weeks and start Coumadin. The pt says he stopped his Plavix 01/12/14 and started Coumadin 01/13/14.             After his PCI the pt says he felt better "for a few days" but has continued to have DOE and chest discomfort since discharge. His symptoms have worsened over the past two weeks, and became severe this am. He was supposed to see Dr Lovena Le this am but came to the ER after he had SOB and chest pian while at cardiac rehab in [place]. In the ER his in noted to be in AF with VR around 100. CXR suggest vascular congestion, BNP 1316, Troponin 0.08. He is comfortable at rest but has dyspnea with any exertion.        Problem List: Past Medical History   Diagnosis  Date     Hypertension         Severe LVH with normal EF     Arteriosclerotic cardiovascular disease (ASCVD)  2005       catheterization in 10/2010:50% mid  LAD, diffuse distal disease, circumflex irregularities, large dominant RCA with a 50% ostial, 70% distal, 60% posterolateral and 70% PDA; normal EF     Cerebrovascular disease  2010       R. carotid endarterectomy; Duplex in 10/2010-widely patent ICAs, subtotal left vertebral-not thought to be contributing to symptoms     Hyperlipidemia       Obesity       Tobacco abuse         Quit 2014     Benign prostatic hypertrophy       Low back pain       History of PSVT (paroxysmal supraventricular tachycardia)  2005       Diagnosed with ILR; no recurrence following RFA     Cervical spine disease         CT in 2012-advanced degeneration and spondylosis with moderate spinal stenosis at C3-C6     H/O: substance abuse         Cocaine, marijuana, alcohol.  Quit 2013.      Syncope       Gastroesophageal reflux disease       Depression       Erectile dysfunction  Coronary artery disease       Myocardial infarction       Anginal pain       Shortness of breath       CHF (congestive heart failure)       H/O hiatal hernia       Headache(784.0)       Arthritis       Carotid artery occlusion       Pericardial effusion without cardiac tamponade  12/03/2013     Diabetes mellitus type 2 in obese  12/03/2013     Non-ST elevation myocardial infarction (NSTEMI), initial episode of care  12/02/2013       DES LAD      Past Surgical History   Procedure  Laterality  Date     Radiofrequency ablation    2005       for PSVT     Coronary angioplasty with stent placement    12/03/2013       LAD 90%-->0% W/ Promus Premier DES 3.0 mm x 16 mm, CFX OK, RCA 40%, EF 70-75%     Carotid endarterectomy  Right  Feb. 25, 2010        CEA      Allergies:  Allergies   Allergen  Reactions     Lactose Intolerance (Gi)          Home Medications Current Facility-Administered Medications   Medication  Dose  Route  Frequency  Provider  Last Rate  Last Dose     metoprolol (LOPRESSOR) injection 5 mg   5 mg   Intravenous  Once  Luan Moore, MD           nitroGLYCERIN (NITROSTAT) SL tablet 0.4 mg   0.4 mg  Sublingual  Q5 min PRN  Luan Moore, MD     0.4 mg at 01/21/14 0956     nitroGLYCERIN 50 mg in dextrose 5 % 250 mL (0.2 mg/mL) infusion   3 mcg/min  Intravenous  Titrated  Erlene Quan, PA-C             Current Outpatient Prescriptions   Medication  Sig  Dispense  Refill     aspirin EC 81 MG tablet  Take 81 mg by mouth daily.           Blood Glucose Monitoring Suppl W/DEVICE KIT  Dispense based on patient/ insurance preference. Use to monitor FSBS once daily. DX: 250.00   1 each   0     cycloSPORINE (RESTASIS) 0.05 % ophthalmic emulsion  Place 1 drop into both eyes 2 (two) times daily.           esomeprazole (NEXIUM) 40 MG capsule  Take 1 capsule (40 mg total) by mouth daily.   30 capsule   3     hydrochlorothiazide (MICROZIDE) 12.5 MG capsule  Take 12.5 mg by mouth daily.           metoprolol succinate (TOPROL-XL) 25 MG 24 hr tablet  Take 1 tablet (25 mg total) by mouth daily.   60 tablet   11     nitroGLYCERIN (NITROSTAT) 0.4 MG SL tablet  Place 1 tablet (0.4 mg total) under the tongue every 5 (five) minutes as needed for chest pain.   25 tablet   12     RAPAFLO 8 MG CAPS capsule  Take 8 mg by mouth daily.            simvastatin (ZOCOR) 40 MG tablet  Take  40 mg by mouth daily.            verapamil (CALAN-SR) 180 MG CR tablet  Take 180 mg by mouth at bedtime.           warfarin (COUMADIN) 5 MG tablet  Take 5 mg by mouth daily. Except on Monday, Wednesday, and Friday, take 2.70m.               Family History   Problem  Relation  Age of Onset     Hypertension  Mother         Cerebrovascular disease     Diabetes  Mother       Coronary artery disease  Father  45    Diabetes type II  Father       Hypertension  Father       Heart attack  Father       Lung cancer  Paternal Uncle       Diabetes  Sister       Hypertension  Sister       Heart attack  Sister  410    Diabetes  Brother        Hypertension  Brother           History       Social History     Marital Status:  Legally Separated       Spouse Name:  N/A       Number of Children:  0     Years of Education:  N/A       Occupational History     Retired         Social History Main Topics     Smoking status:  Former Smoker -- 1.00 packs/day for 40 years       Types:  Cigarettes       Quit date:  10/10/2012     Smokeless tobacco:  Never Used         Comment: Quit in May.      Alcohol Use:  No     Drug Use:  No         Comment: quit cocaine 10/2011     Sexual Activity:  No       Other Topics  Concern     Not on file       Social History Narrative     [place]        Review of Systems: General: negative for chills, fever, night sweats or weight changes.   Cardiovascular: negative for chest pain, dyspnea on exertion, edema, orthopnea, palpitations, paroxysmal nocturnal dyspnea or shortness of breath Dermatological: negative for rash Respiratory: negative for cough or wheezing Urologic: negative for hematuria Abdominal: negative for nausea, vomiting, diarrhea, bright red blood per rectum, melena, or hematemesis Neurologic: negative for visual changes, syncope, or dizziness All other systems reviewed and are otherwise negative except as noted above.   Physical Exam: Blood pressure 114/77, pulse 82, resp. rate 14, SpO2 99.00%.  General appearance: alert, cooperative, no distress and mildly obese Neck: no carotid bruit and no JVD Lungs: basilar crackles Heart: irregularly irregular rhythm and 2/6 systolic murmur, no rub Abdomen: soft, non-tender; bowel sounds normal; no masses,  no organomegaly Extremities: extremities normal, atraumatic, no cyanosis or edema Pulses: 2+ and symmetric Skin: Skin color, texture, turgor normal. No rashes or lesions Neurologic: Grossly normal       Labs:    Results for orders placed during the  hospital encounter of 01/21/14 (from the past 24  hour(s))   CBC     Status: None     Collection Time      01/21/14  9:32 AM       Result  Value  Ref Range     WBC  6.7   4.0 - 10.5 K/uL     RBC  5.24   4.22 - 5.81 MIL/uL     Hemoglobin  16.3   13.0 - 17.0 g/dL     HCT  45.5   39.0 - 52.0 %     MCV  86.8   78.0 - 100.0 fL     MCH  31.1   26.0 - 34.0 pg     MCHC  35.8   30.0 - 36.0 g/dL     RDW  12.1   11.5 - 15.5 %     Platelets  237   150 - 400 K/uL   BASIC METABOLIC PANEL     Status: Abnormal     Collection Time      01/21/14  9:32 AM       Result  Value  Ref Range     Sodium  137   137 - 147 mEq/L     Potassium  3.4 (*)  3.7 - 5.3 mEq/L     Chloride  97   96 - 112 mEq/L     CO2  22   19 - 32 mEq/L     Glucose, Bld  119 (*)  70 - 99 mg/dL     BUN  27 (*)  6 - 23 mg/dL     Creatinine, Ser  1.13   0.50 - 1.35 mg/dL     Calcium  9.7   8.4 - 10.5 mg/dL     GFR calc non Af Amer  70 (*)  >90 mL/min     GFR calc Af Amer  81 (*)  >90 mL/min     Anion gap  18 (*)  5 - 15   PRO B NATRIURETIC PEPTIDE     Status: Abnormal     Collection Time      01/21/14  9:32 AM       Result  Value  Ref Range     Pro B Natriuretic peptide (BNP)  1316.0 (*)  0 - 125 pg/mL   I-STAT TROPOININ, ED     Status: None     Collection Time      01/21/14  9:42 AM       Result  Value  Ref Range     Troponin i, poc  0.08   0.00 - 0.08 ng/mL     Comment 3                 Radiology/Studies: No results found.   EKG: AF, LVH with strain   ASSESSMENT AND PLAN:   Principal Problem:   Unstable angina Active Problems:   Arteriosclerotic cardiovascular disease (ASCVD)- LAD DES 12/03/13   PAF (paroxysmal atrial fibrillation)   Hypertension   Hypertrophic cardiomyopathy   Pericardial effusion without cardiac tamponade   Diabetes mellitus type 2 in obese   Chronic anticoagulation- Plavix stopped 8/10- Coumadin rersumed 8/11   GERD   PSVT (paroxysmal supraventricular tachycardia)   Cerebrovascular disease   Hyperlipidemia   Obesity (BMI 30-39.9)   Sleep  apnea by history   PLAN: Discussed with Dr Gwenlyn Found, hold Coumadin (INR 4.3), add IV NTG, stat echo to r/o worsening  pericardial effusion, resume Plavix. He will need cath once INR comes down. Will give Lasix 40 mg IV x1 with K+ in ER.     Henri Medal, PA-C 01/21/2014, 11:12 AM   Agree with note written by Kerin Ransom PAC   Pt with recent LAD PCI with DES, PAF, and moderate pericardial effusion. He has had increased DOE and CP over the past few days. He stopped his plavix as instructed on 8/15 and was placed on coumadin AC for his Afib (which he is currently in). INR 4.3. Exam benign. 2/6 outflow murmur. No periph edema. I worry about sub acute stent thrombosis. Currently pain free. No evidence of CHF. Will get 2D to r/o worsening effusion, hold coumadin. Restart plavix. Will ultimatey require re cath once INR < 1.7.     Quay Burow J 01/21/2014 1:04 PM CARE MANAGEMENT NOTE 01/21/2014  Patient:  RAYMEL, CULL   Account Number:  [id number]  Date Initiated:  01/21/2014  Documentation initiated by:  Elissa Hefty  Subjective/Objective Assessment:   adm w ch pain     Action/Plan:   lives w wife, pcp dr [name]   Anticipated DC Date:     Anticipated DC Plan:           Choice offered to / List presented to:             Status of service:   Medicare Important Message given?   (If response is "NO", the following Medicare IM given date fields will be blank) Date Medicare IM given:   Medicare IM given by:   Date Additional Medicare IM given:   Additional Medicare IM given by:    Discharge Disposition:    Per UR Regulation:  Reviewed for med. necessity/level of care/duration of stay  If discussed at [place] of Stay Meetings, dates discussed:    Comments:

## 2014-01-21 NOTE — ED Notes (Signed)
Spoke with 2H, RN will call back for report ASAP.

## 2014-01-21 NOTE — ED Notes (Signed)
Pt c/o left sided CP with SOB starting last night; pt sts hx of recent stents

## 2014-01-21 NOTE — ED Provider Notes (Signed)
CSN: [id number]     Arrival date & time 01/21/14  5784 History   First MD Initiated Contact with Patient 01/21/14 [phone]     Chief Complaint  Patient presents with  . Chest Pain    HPI  Peter Dunlap is a 58 year old gentleman with a history of coronary artery disease (NSTEMI in July 2015 status post drug-eluting stent to LAD), GERD, congestive heart failure, SVT status post catheter ablation, and atrial fibrillation who presents chest pain since this morning.  Patient reports that the chest pain is left-sided, 5/10 in severity, feels like a tight pressure, and radiates down his left arm. He reports that the pain is similar to the symptoms that he had during his NSTEMI in July although lesser in severity. He also reports that he has been having an exacerbation of his dyspnea on exertion. At baseline, he is not able to ambulate for more than a couple feet without getting short of breath, though he reports that this morning, this was worsened. Patient did take nitroglycerin with some alleviation of his symptoms. Patient took an aspirin this morning as well. He denies any fevers or chills, new cough, nausea, vomiting, or lower extremity edema.  Patient was to have an echo performed by cardiology this morning, but was sent to the ED in the context of his current symptoms.  Past Medical History  Diagnosis Date  . Hypertension     Severe LVH with normal EF  . Arteriosclerotic cardiovascular disease (ASCVD) 2005    catheterization in 10/2010:50% mid LAD, diffuse distal disease, circumflex irregularities, large dominant RCA with a 50% ostial, 70% distal, 60% posterolateral and 70% PDA; normal EF  . Cerebrovascular disease 2010    R. carotid endarterectomy; Duplex in 10/2010-widely patent ICAs, subtotal left vertebral-not thought to be contributing to symptoms  . Hyperlipidemia   . Obesity   . Tobacco abuse     Quit 2014  . Benign prostatic hypertrophy   . Low back pain   . History of PSVT (paroxysmal  supraventricular tachycardia) 2005    Diagnosed with ILR; no recurrence following RFA  . Cervical spine disease     CT in 2012-advanced degeneration and spondylosis with moderate spinal stenosis at C3-C6  . H/O: substance abuse     Cocaine, marijuana, alcohol.  Quit 2013.   [name] Syncope   . Gastroesophageal reflux disease   . Depression   . Erectile dysfunction   . Coronary artery disease   . Myocardial infarction   . Anginal pain   . Shortness of breath   . CHF (congestive heart failure)   . H/O hiatal hernia   . Headache(784.0)   . Arthritis   . Carotid artery occlusion   . Pericardial effusion without cardiac tamponade 12/03/2013  . Diabetes mellitus type 2 in obese 12/03/2013  . Non-ST elevation myocardial infarction (NSTEMI), initial episode of care 12/02/2013    DES LAD   Past Surgical History  Procedure Laterality Date  . Radiofrequency ablation  2005    for PSVT  . Coronary angioplasty with stent placement  12/03/2013    LAD 90%-->0% W/ Promus Premier DES 3.0 mm x 16 mm, CFX OK, RCA 40%, EF 70-75%  . Carotid endarterectomy Right Feb. 25, 2010     CEA   Family History  Problem Relation Age of Onset  . Hypertension Mother     Cerebrovascular disease  . Diabetes Mother   . Coronary artery disease Father 38  . Diabetes type II Father   .  Hypertension Father   . Heart attack Father   . Lung cancer Paternal Uncle   . Diabetes Sister   . Hypertension Sister   . Heart attack Sister 41  . Diabetes Brother   . Hypertension Brother    History  Substance Use Topics  . Smoking status: Former Smoker -- 1.00 packs/day for 40 years    Types: Cigarettes    Quit date: 10/10/2012  . Smokeless tobacco: Never Used     Comment: Quit in May.   . Alcohol Use: No    Review of Systems  Constitutional: Negative for fever and chills.  Respiratory: Positive for shortness of breath. Negative for wheezing.   Cardiovascular: Positive for chest pain. Negative for leg swelling.   Gastrointestinal: Positive for nausea. Negative for vomiting.  Genitourinary: Negative for dysuria and hematuria.      Allergies  Lactose intolerance (gi)  Home Medications   Prior to Admission medications   Medication Sig Start Date End Date Taking? Authorizing Provider  aspirin EC 81 MG tablet Take 81 mg by mouth daily.   Yes Historical Provider, MD  Blood Glucose Monitoring Suppl W/DEVICE KIT Dispense based on patient/ insurance preference. Use to monitor FSBS once daily. DX: 250.00 01/05/14  Yes [name], MD  cycloSPORINE (RESTASIS) 0.05 % ophthalmic emulsion Place 1 drop into both eyes 2 (two) times daily.   Yes Historical Provider, MD  esomeprazole (NEXIUM) 40 MG capsule Take 1 capsule (40 mg total) by mouth daily. 01/05/14  Yes [name], MD  hydrochlorothiazide (MICROZIDE) 12.5 MG capsule Take 12.5 mg by mouth daily.   Yes Historical Provider, MD  metoprolol succinate (TOPROL-XL) 25 MG 24 hr tablet Take 1 tablet (25 mg total) by mouth daily. 12/05/13  Yes Rhonda G Barrett, PA-C  nitroGLYCERIN (NITROSTAT) 0.4 MG SL tablet Place 1 tablet (0.4 mg total) under the tongue every 5 (five) minutes as needed for chest pain. 12/05/13  Yes Rhonda G Barrett, PA-C  RAPAFLO 8 MG CAPS capsule Take 8 mg by mouth daily.  10/21/13  Yes Historical Provider, MD  simvastatin (ZOCOR) 40 MG tablet Take 40 mg by mouth daily.    Yes Historical Provider, MD  verapamil (CALAN-SR) 180 MG CR tablet Take 180 mg by mouth at bedtime.   Yes Historical Provider, MD  warfarin (COUMADIN) 5 MG tablet Take 5 mg by mouth daily. Except on Monday, Wednesday, and Friday, take 2.$RemoveBefore'5mg'edevKyFKDfLdt$ .   Yes Historical Provider, MD   BP 141/85  Resp 16  SpO2 99% Physical Exam  Constitutional: He is oriented to person, place, and time. He appears well-developed and well-nourished. No distress.  HENT:  Head: Normocephalic and atraumatic.  Cardiovascular: Exam reveals no gallop and no friction rub.   Murmur ( 2/6 systolic )  heard. Irregularly irregular rhythm with mild tachycardia  Pulmonary/Chest: Effort normal and breath sounds normal. He has no wheezes. He has no rales.  Abdominal: Soft. Bowel sounds are normal. There is no tenderness.  Musculoskeletal: He exhibits no edema.  Neurological: He is alert and oriented to person, place, and time.    ED Course  Procedures (including critical care time) Labs Review Labs Reviewed  [place], ED    Imaging Review No results found.   EKG Interpretation   Date/Time:  Wednesday January 21 2014 08:37:41 EDT Ventricular Rate:  106 PR Interval:    QRS Duration: 100 QT Interval:  392 QTC Calculation: 520 R  Axis:   17 Text Interpretation:  Atrial fibrillation with rapid ventricular response  Left ventricular hypertrophy Marked ST abnormality, possible lateral  subendocardial injury Prolonged QT Abnormal ECG Confirmed by RAY MD,  Andee Poles [phone]) on 01/21/2014 9:16:11 AM      MDM   Final diagnoses:  None    Patient is a 58 year old male with history of coronary artery disease  (NSTEMI in July 2015 status post drug-eluting stent to LAD), GERD, congestive heart failure, SVT status post catheter ablation, and atrial fibrillation who presents with typical chest pain that is concerning for ACS. Patient also in atrial fibrillation, which has been persistent over the last month, with an elevated rate at around 100, which is above baseline around in the 70s in clinic.  - Sublingual nitroglycerin  - 5 mg metoprolol IV  - Chest x-ray, troponin, CBC, BMP pending.  - Cardiology consulted and will see.  10:15 AM  - Troponin 0.08.  11:25 AM  - Cardiology to admit.      Luan Moore, MD 01/21/14 [phone]

## 2014-01-21 NOTE — H&P (Signed)
Patient ID: Peter Dunlap MRN: [id number], DOB/AGE: 08-28-1955   Admit date: 01/21/2014   Primary Physician: [name], MD Primary Cardiologist: Dr Lovena Le  HPI:   Peter Dunlap is a 58 y.o. male with PMH of  moderate to severe 2 Vessel CAD by catheterization in 2012 -- treated medically. He presented to [facility] ER 12/02/13 with ~1 week of worsening exertional dyspnea with mild chest pressure. He was also noted to be in Afib. He ruled In for NSTEMI. He underwent cath/ PCI by Dr Ellyn Hack 12/03/13 with a Promus DES to the LAD. The pt had stable distal RCA-PDA disease of 60-70%. EF was hyperdynamic. Echo done 12/02/13 showed an EF of 70-75%. There was a moderate pericardial effusion noted but no evidence of tamponade. The pt had PAF during that hospitalization and it was decided not to resume Coumadin till later secondary to his pericardia effusion. He saw Dr Lovena Le 12/24/13. The pt was instructed to stop Plavix in 3 weeks and start Coumadin. The pt says he stopped his Plavix 01/12/14 and started Coumadin 01/13/14.            After his PCI the pt says he felt better "for a few days" but has continued to have DOE and chest discomfort since discharge. His symptoms have worsened over the past two weeks, and became severe this am. He was supposed to see Dr Lovena Le this am but came to the ER after he had SOB and chest pian while at cardiac rehab in [place]. In the ER his in noted to be in AF with VR around 100. CXR suggest vascular congestion, BNP 1316, Troponin 0.08. He is comfortable at rest but has dyspnea with any exertion.     Problem List: Past Medical History  Diagnosis Date  . Hypertension     Severe LVH with normal EF  . Arteriosclerotic cardiovascular disease (ASCVD) 2005    catheterization in 10/2010:50% mid LAD, diffuse distal disease, circumflex irregularities, large dominant RCA with a 50% ostial, 70% distal, 60% posterolateral and 70% PDA; normal EF  . Cerebrovascular  disease 2010    R. carotid endarterectomy; Duplex in 10/2010-widely patent ICAs, subtotal left vertebral-not thought to be contributing to symptoms  . Hyperlipidemia   . Obesity   . Tobacco abuse     Quit 2014  . Benign prostatic hypertrophy   . Low back pain   . History of PSVT (paroxysmal supraventricular tachycardia) 2005    Diagnosed with ILR; no recurrence following RFA  . Cervical spine disease     CT in 2012-advanced degeneration and spondylosis with moderate spinal stenosis at C3-C6  . H/O: substance abuse     Cocaine, marijuana, alcohol.  Quit 2013.   [name] Syncope   . Gastroesophageal reflux disease   . Depression   . Erectile dysfunction   . Coronary artery disease   . Myocardial infarction   . Anginal pain   . Shortness of breath   . CHF (congestive heart failure)   . H/O hiatal hernia   . Headache(784.0)   . Arthritis   . Carotid artery occlusion   . Pericardial effusion without cardiac tamponade 12/03/2013  . Diabetes mellitus type 2 in obese 12/03/2013  . Non-ST elevation myocardial infarction (NSTEMI), initial episode of care 12/02/2013    DES LAD    Past Surgical History  Procedure Laterality Date  . Radiofrequency ablation  2005    for PSVT  . Coronary angioplasty with stent placement  12/03/2013  LAD 90%-->0% W/ Promus Premier DES 3.0 mm x 16 mm, CFX OK, RCA 40%, EF 70-75%  . Carotid endarterectomy Right Feb. 25, 2010     CEA     Allergies:  Allergies  Allergen Reactions  . Lactose Intolerance (Gi)      Home Medications Current Facility-Administered Medications  Medication Dose Route Frequency Provider Last Rate Last Dose  . metoprolol (LOPRESSOR) injection 5 mg  5 mg Intravenous Once Luan Moore, MD      . nitroGLYCERIN (NITROSTAT) SL tablet 0.4 mg  0.4 mg Sublingual Q5 min PRN Luan Moore, MD   0.4 mg at 01/21/14 0956  . nitroGLYCERIN 50 mg in dextrose 5 % 250 mL (0.2 mg/mL) infusion  3 mcg/min Intravenous Titrated Erlene Quan, PA-C        Current Outpatient Prescriptions  Medication Sig Dispense Refill  . aspirin EC 81 MG tablet Take 81 mg by mouth daily.      . Blood Glucose Monitoring Suppl W/DEVICE KIT Dispense based on patient/ insurance preference. Use to monitor FSBS once daily. DX: 250.00  1 each  0  . cycloSPORINE (RESTASIS) 0.05 % ophthalmic emulsion Place 1 drop into both eyes 2 (two) times daily.      [name] esomeprazole (NEXIUM) 40 MG capsule Take 1 capsule (40 mg total) by mouth daily.  30 capsule  3  . hydrochlorothiazide (MICROZIDE) 12.5 MG capsule Take 12.5 mg by mouth daily.      . metoprolol succinate (TOPROL-XL) 25 MG 24 hr tablet Take 1 tablet (25 mg total) by mouth daily.  60 tablet  11  . nitroGLYCERIN (NITROSTAT) 0.4 MG SL tablet Place 1 tablet (0.4 mg total) under the tongue every 5 (five) minutes as needed for chest pain.  25 tablet  12  . RAPAFLO 8 MG CAPS capsule Take 8 mg by mouth daily.       . simvastatin (ZOCOR) 40 MG tablet Take 40 mg by mouth daily.       . verapamil (CALAN-SR) 180 MG CR tablet Take 180 mg by mouth at bedtime.      [name] warfarin (COUMADIN) 5 MG tablet Take 5 mg by mouth daily. Except on Monday, Wednesday, and Friday, take 2.$RemoveBefore'5mg'PFDuMAYfvkVik$ .         Family History  Problem Relation Age of Onset  . Hypertension Mother     Cerebrovascular disease  . Diabetes Mother   . Coronary artery disease Father 63  . Diabetes type II Father   . Hypertension Father   . Heart attack Father   . Lung cancer Paternal Uncle   . Diabetes Sister   . Hypertension Sister   . Heart attack Sister 35  . Diabetes Brother   . Hypertension Brother      History   Social History  . Marital Status: Legally Separated    Spouse Name: N/A    Number of Children: 0  . Years of Education: N/A   Occupational History  . Retired    Social History Main Topics  . Smoking status: Former Smoker -- 1.00 packs/day for 40 years    Types: Cigarettes    Quit date: 10/10/2012  . Smokeless tobacco: Never Used      Comment: Quit in May.   . Alcohol Use: No  . Drug Use: No     Comment: quit cocaine 10/2011  . Sexual Activity: No   Other Topics Concern  . Not on file   Social History Narrative   Lives in  [place].     Review of Systems: General: negative for chills, fever, night sweats or weight changes.  Cardiovascular: negative for chest pain, dyspnea on exertion, edema, orthopnea, palpitations, paroxysmal nocturnal dyspnea or shortness of breath Dermatological: negative for rash Respiratory: negative for cough or wheezing Urologic: negative for hematuria Abdominal: negative for nausea, vomiting, diarrhea, bright red blood per rectum, melena, or hematemesis Neurologic: negative for visual changes, syncope, or dizziness All other systems reviewed and are otherwise negative except as noted above.  Physical Exam: Blood pressure 114/77, pulse 82, resp. rate 14, SpO2 99.00%.  General appearance: alert, cooperative, no distress and mildly obese Neck: no carotid bruit and no JVD Lungs: basilar crackles Heart: irregularly irregular rhythm and 2/6 systolic murmur, no rub Abdomen: soft, non-tender; bowel sounds normal; no masses,  no organomegaly Extremities: extremities normal, atraumatic, no cyanosis or edema Pulses: 2+ and symmetric Skin: Skin color, texture, turgor normal. No rashes or lesions Neurologic: Grossly normal    Labs:   Results for orders placed during the hospital encounter of 01/21/14 (from the past 24 hour(s))  CBC     Status: None   Collection Time    01/21/14  9:32 AM      Result Value Ref Range   WBC 6.7  4.0 - 10.5 K/uL   RBC 5.24  4.22 - 5.81 MIL/uL   Hemoglobin 16.3  13.0 - 17.0 g/dL   HCT 45.5  39.0 - 52.0 %   MCV 86.8  78.0 - 100.0 fL   MCH 31.1  26.0 - 34.0 pg   MCHC 35.8  30.0 - 36.0 g/dL   RDW 12.1  11.5 - 15.5 %   Platelets 237  150 - 400 K/uL  BASIC METABOLIC PANEL     Status: Abnormal   Collection Time    01/21/14  9:32 AM      Result Value Ref  Range   Sodium 137  137 - 147 mEq/L   Potassium 3.4 (*) 3.7 - 5.3 mEq/L   Chloride 97  96 - 112 mEq/L   CO2 22  19 - 32 mEq/L   Glucose, Bld 119 (*) 70 - 99 mg/dL   BUN 27 (*) 6 - 23 mg/dL   Creatinine, Ser 1.13  0.50 - 1.35 mg/dL   Calcium 9.7  8.4 - 10.5 mg/dL   GFR calc non Af Amer 70 (*) >90 mL/min   GFR calc Af Amer 81 (*) >90 mL/min   Anion gap 18 (*) 5 - 15  PRO B NATRIURETIC PEPTIDE     Status: Abnormal   Collection Time    01/21/14  9:32 AM      Result Value Ref Range   Pro B Natriuretic peptide (BNP) 1316.0 (*) 0 - 125 pg/mL  I-STAT TROPOININ, ED     Status: None   Collection Time    01/21/14  9:42 AM      Result Value Ref Range   Troponin i, poc 0.08  0.00 - 0.08 ng/mL   Comment 3              Radiology/Studies: No results found.  EKG: AF, LVH with strain  ASSESSMENT AND PLAN:  Principal Problem:   Unstable angina Active Problems:   Arteriosclerotic cardiovascular disease (ASCVD)- LAD DES 12/03/13   PAF (paroxysmal atrial fibrillation)   Hypertension   Hypertrophic cardiomyopathy   Pericardial effusion without cardiac tamponade   Diabetes mellitus type 2 in obese   Chronic anticoagulation- Plavix stopped 8/10- Coumadin rersumed  8/11   GERD   PSVT (paroxysmal supraventricular tachycardia)   Cerebrovascular disease   Hyperlipidemia   Obesity (BMI 30-39.9)   Sleep apnea by history   PLAN: Discussed with Dr Gwenlyn Found, hold Coumadin (INR 4.3), add IV NTG, stat echo to r/o worsening pericardial effusion, resume Plavix. He will need cath once INR comes down. Will give Lasix 40 mg IV x1 with K+ in ER.   Henri Medal, PA-C 01/21/2014, 11:12 AM  Agree with note written by Kerin Ransom PAC  Pt with recent LAD PCI with DES, PAF, and moderate pericardial effusion. He has had increased DOE and CP over the past few days. He stopped his plavix as instructed on 8/15 and was placed on coumadin AC for his Afib (which he is currently in). INR 4.3. Exam benign. 2/6  outflow murmur. No periph edema. I worry about sub acute stent thrombosis. Currently pain free. No evidence of CHF. Will get 2D to r/o worsening effusion, hold coumadin. Restart plavix. Will ultimatey require re cath once INR < 1.7.   Lorretta Harp 01/21/2014 1:04 PM

## 2014-01-22 LAB — CBC
HCT: 43.2 % (ref 39.0–52.0)
Hemoglobin: 15 g/dL (ref 13.0–17.0)
MCH: 30.5 pg (ref 26.0–34.0)
MCHC: 34.7 g/dL (ref 30.0–36.0)
MCV: 88 fL (ref 78.0–100.0)
Platelets: 222 10*3/uL (ref 150–400)
RBC: 4.91 MIL/uL (ref 4.22–5.81)
RDW: 12.3 % (ref 11.5–15.5)
WBC: 7.7 10*3/uL (ref 4.0–10.5)

## 2014-01-22 LAB — BASIC METABOLIC PANEL
Anion gap: 17 — ABNORMAL HIGH (ref 5–15)
BUN: 27 mg/dL — ABNORMAL HIGH (ref 6–23)
CO2: 22 mEq/L (ref 19–32)
Calcium: 9.5 mg/dL (ref 8.4–10.5)
Chloride: 100 mEq/L (ref 96–112)
Creatinine, Ser: 1.06 mg/dL (ref 0.50–1.35)
GFR calc Af Amer: 88 mL/min — ABNORMAL LOW (ref >90)
GFR calc non Af Amer: 76 mL/min — ABNORMAL LOW (ref >90)
Glucose, Bld: 96 mg/dL (ref 70–99)
Potassium: 3.1 mEq/L — ABNORMAL LOW (ref 3.7–5.3)
Sodium: 139 mEq/L (ref 137–147)

## 2014-01-22 LAB — PROTIME-INR
INR: 3.95 — ABNORMAL HIGH (ref 0.00–1.49)
Prothrombin Time: 38.6 seconds — ABNORMAL HIGH (ref 11.6–15.2)

## 2014-01-22 MED ORDER — ALUM & MAG HYDROXIDE-SIMETH 200-200-20 MG/5ML PO SUSP
30.0000 mL | ORAL | Status: DC | PRN
  Administered 2014-01-23 – 2014-01-25 (×5): 30 mL via ORAL
  Filled 2014-01-22 (×5): qty 30

## 2014-01-22 MED ORDER — SIMETHICONE 80 MG PO CHEW
80.0000 mg | CHEWABLE_TABLET | Freq: Four times a day (QID) | ORAL | Status: DC | PRN
  Administered 2014-01-22: 80 mg via ORAL
  Filled 2014-01-22 (×4): qty 1

## 2014-01-22 MED ORDER — FUROSEMIDE 10 MG/ML IJ SOLN
40.0000 mg | Freq: Once | INTRAMUSCULAR | Status: AC
  Administered 2014-01-22: 40 mg via INTRAVENOUS
  Filled 2014-01-22: qty 4

## 2014-01-22 MED ORDER — POTASSIUM CHLORIDE CRYS ER 20 MEQ PO TBCR
40.0000 meq | EXTENDED_RELEASE_TABLET | Freq: Once | ORAL | Status: AC
  Administered 2014-01-22: 40 meq via ORAL
  Filled 2014-01-22: qty 2

## 2014-01-22 NOTE — Progress Notes (Signed)
Patient Name: Peter Dunlap Date of Encounter: 01/22/2014  Principal Problem:   Unstable angina Active Problems:   GERD   PSVT (paroxysmal supraventricular tachycardia)   Hypertension   Arteriosclerotic cardiovascular disease (ASCVD)- LAD DES 12/03/13   Cerebrovascular disease   Hyperlipidemia   Hypertrophic cardiomyopathy   PAF (paroxysmal atrial fibrillation)   Pericardial effusion without cardiac tamponade   Diabetes mellitus type 2 in obese   Obesity (BMI 30-39.9)   Sleep apnea by history   Chronic anticoagulation- Plavix stopped 8/10- Coumadin rersumed 8/11   Length of Stay: 1  SUBJECTIVE  SOB with walking to te bathroom. CP early this am, currently pain free.   CURRENT MEDS . aspirin EC  81 mg Oral Daily  . atorvastatin  20 mg Oral q1800  . clopidogrel  75 mg Oral Once  . cycloSPORINE  1 drop Both Eyes BID  . furosemide  40 mg Intravenous Once  . hydrochlorothiazide  12.5 mg Oral Daily  . metoprolol  5 mg Intravenous Once  . metoprolol succinate  25 mg Oral Daily  . pantoprazole  40 mg Oral Daily  . tamsulosin  0.4 mg Oral QPC supper  . verapamil  180 mg Oral QHS   OBJECTIVE  Filed Vitals:   01/21/14 2333 01/22/14 0300 01/22/14 0428 01/22/14 0823  BP: 98/53  90/52 105/46  Pulse: 87 74 78   Temp: 98.4 F (36.9 C)  98.7 F (37.1 C) 97.6 F (36.4 C)  TempSrc: Oral  Oral Oral  Resp: 16  18 20   Height:      Weight:  283 lb 11.7 oz (128.7 kg)    SpO2: 96%  99% 100%    Intake/Output Summary (Last 24 hours) at 01/22/14 0940 Last data filed at 01/22/14 0400  Gross per 24 hour  Intake 664.24 ml  Output    250 ml  Net 414.24 ml   Filed Weights   01/21/14 1300 01/22/14 0300  Weight: 302 lb 14.6 oz (137.4 kg) 283 lb 11.7 oz (128.7 kg)   PHYSICAL EXAM  General: Pleasant, NAD. Neuro: Alert and oriented X 3. Moves all extremities spontaneously. Psych: Normal affect. HEENT:  Normal  Neck: Supple without bruits or JVD. Lungs:  Resp regular and  unlabored, CTA. Heart: RRR no s3, s4, or murmurs. Abdomen: Soft, non-tender, non-distended, BS + x 4.  Extremities: No clubbing, cyanosis or edema. DP/PT/Radials 2+ and equal bilaterally.  Accessory Clinical Findings  CBC  Recent Labs  01/21/14 0932 01/22/14 0400  WBC 6.7 7.7  HGB 16.3 15.0  HCT 45.5 43.2  MCV 86.8 88.0  PLT 237 458   Basic Metabolic Panel  Recent Labs  01/21/14 0932 01/22/14 0328  NA 137 139  K 3.4* 3.1*  CL 97 100  CO2 22 22  GLUCOSE 119* 96  BUN 27* 27*  CREATININE 1.13 1.06  CALCIUM 9.7 9.5  MG 1.7  --    Radiology/Studies  Dg Chest 2 View  01/21/2014   CLINICAL DATA:  Left chest pain  EXAM: CHEST  2 VIEW  COMPARISON:  12/07/2013  FINDINGS: Cardiac enlargement. Negative for heart failure or pneumonia. Lungs are clear.  IMPRESSION: No active cardiopulmonary disease.     TELE: a-fib, 70 BPM  ECG: a-fib, negative T waves in the lateral leads, unchanged from 12/24/2013     ASSESSMENT AND PLAN  Principal Problem:  Unstable angina  Active Problems:  Arteriosclerotic cardiovascular disease (ASCVD)- LAD DES 12/03/13  PAF (paroxysmal atrial fibrillation)  Hypertension  Hypertrophic cardiomyopathy  Pericardial effusion without cardiac tamponade  Diabetes mellitus type 2 in obese  Chronic anticoagulation- Plavix stopped 8/10- Coumadin rersumed 8/11  GERD  PSVT (paroxysmal supraventricular tachycardia)  Cerebrovascular disease  Hyperlipidemia  Obesity (BMI 30-39.9)  Sleep apnea by history  58 y.o. male   1. CAD - unstable angina - h/o moderate to severe 2 Vessel CAD by catheterization in 2012- treated medically. NSTEMI on 12/03/13 - s/p PCI/ LAD with Promus stent. The pt had stable distal RCA-PDA disease of 60-70%. EF was hyperdynamic. Echo done 12/02/13 showed moderate pericardial effusion noted but no evidence of tamponade.  The pt had PAF during that hospitalization and it was decided not to resume Coumadin till later secondary to his  pericardial effusion. He saw Dr Lovena Le 12/24/13. The pt was instructed to stop Plavix in 3 weeks and start Coumadin. The pt says he stopped his Plavix 01/12/14 and started Coumadin 01/13/14.  After his PCI the pt says he felt better "for a few days" but has continued to have DOE and chest discomfort since discharge. His symptoms have worsened over the past two weeks. Troponin is negative. Coumadine was held. INR today 3.95. Plavix was restarted.  Plan: cath once INR < 2  2. Pericardial effusion - repeat echo is pending  3. Acute on chronic diastolic CHF - got Lasix 40 mg IV x1, we will repeat today  4. Hypokalemia - we will replace   Signed, Dorothy Spark MD, [facility] 01/22/2014

## 2014-01-22 NOTE — Progress Notes (Signed)
ANTICOAGULATION CONSULT NOTE - Follow Up Consult  Pharmacy Consult for Heparin (when INR <2) Indication: chest pain/ACS, while warfarin on hold  Allergies  Allergen Reactions  . Lactose Intolerance (Gi)     Labs:  Recent Labs  01/19/14 1048 01/21/14 0932 01/21/14 1254 01/22/14 0328 01/22/14 0400  HGB  --  16.3  --   --  15.0  HCT  --  45.5  --   --  43.2  PLT  --  237  --   --  222  LABPROT  --   --  37.0* 38.6*  --   INR 4.3  --  3.74* 3.95*  --   CREATININE  --  1.13  --  1.06  --    Assessment: 58 y/o M to start heparin when INR <2. INR is up to 3.95 today. Other labs as above.   Goal of Therapy:  Heparin level 0.3-0.7 units/ml Monitor platelets by anticoagulation protocol: Yes   Plan:  -No heparin today -Re-evaluate INR 8/21 AM -Monitor for bleeding  [name] 01/22/2014,5:45 AM

## 2014-01-23 ENCOUNTER — Encounter (HOSPITAL_COMMUNITY): Payer: PRIVATE HEALTH INSURANCE

## 2014-01-23 DIAGNOSIS — I4891 Unspecified atrial fibrillation: Secondary | ICD-10-CM

## 2014-01-23 DIAGNOSIS — I951 Orthostatic hypotension: Secondary | ICD-10-CM

## 2014-01-23 DIAGNOSIS — I319 Disease of pericardium, unspecified: Secondary | ICD-10-CM

## 2014-01-23 DIAGNOSIS — I200 Unstable angina: Secondary | ICD-10-CM

## 2014-01-23 LAB — GLUCOSE, CAPILLARY
Glucose-Capillary: 124 mg/dL — ABNORMAL HIGH (ref 70–99)
Glucose-Capillary: 121 mg/dL — ABNORMAL HIGH (ref 70–99)
Glucose-Capillary: 107 mg/dL — ABNORMAL HIGH (ref 70–99)
Glucose-Capillary: 131 mg/dL — ABNORMAL HIGH (ref 70–99)

## 2014-01-23 LAB — BASIC METABOLIC PANEL
Anion gap: 15 (ref 5–15)
BUN: 27 mg/dL — ABNORMAL HIGH (ref 6–23)
CO2: 23 mEq/L (ref 19–32)
Calcium: 9.4 mg/dL (ref 8.4–10.5)
Chloride: 100 mEq/L (ref 96–112)
Creatinine, Ser: 1.09 mg/dL (ref 0.50–1.35)
GFR calc Af Amer: 85 mL/min — ABNORMAL LOW (ref >90)
GFR calc non Af Amer: 73 mL/min — ABNORMAL LOW (ref >90)
Glucose, Bld: 116 mg/dL — ABNORMAL HIGH (ref 70–99)
Potassium: 3.4 mEq/L — ABNORMAL LOW (ref 3.7–5.3)
Sodium: 138 mEq/L (ref 137–147)

## 2014-01-23 LAB — CBC
HCT: 43.9 % (ref 39.0–52.0)
Hemoglobin: 15.1 g/dL (ref 13.0–17.0)
MCH: 30.9 pg (ref 26.0–34.0)
MCHC: 34.4 g/dL (ref 30.0–36.0)
MCV: 89.8 fL (ref 78.0–100.0)
Platelets: 209 10*3/uL (ref 150–400)
RBC: 4.89 MIL/uL (ref 4.22–5.81)
RDW: 12.4 % (ref 11.5–15.5)
WBC: 7.1 10*3/uL (ref 4.0–10.5)

## 2014-01-23 LAB — PROTIME-INR
INR: 2.84 — ABNORMAL HIGH (ref 0.00–1.49)
Prothrombin Time: 29.8 seconds — ABNORMAL HIGH (ref 11.6–15.2)

## 2014-01-23 MED ORDER — POTASSIUM CHLORIDE CRYS ER 20 MEQ PO TBCR
40.0000 meq | EXTENDED_RELEASE_TABLET | Freq: Every day | ORAL | Status: AC
  Administered 2014-01-23 – 2014-01-25 (×3): 40 meq via ORAL
  Filled 2014-01-23 (×3): qty 2

## 2014-01-23 MED ORDER — INSULIN ASPART 100 UNIT/ML ~~LOC~~ SOLN
0.0000 [IU] | Freq: Three times a day (TID) | SUBCUTANEOUS | Status: DC
  Administered 2014-01-23: 14:00:00 2 [IU] via SUBCUTANEOUS

## 2014-01-23 MED ORDER — INSULIN ASPART 100 UNIT/ML ~~LOC~~ SOLN: 0.0000 [IU] | Freq: Every day | SUBCUTANEOUS | Status: DC

## 2014-01-23 MED ORDER — SODIUM CHLORIDE 0.9 % IV SOLN
INTRAVENOUS | Status: DC
  Administered 2014-01-23 – 2014-01-25 (×4): via INTRAVENOUS

## 2014-01-23 NOTE — Progress Notes (Signed)
ANTICOAGULATION CONSULT NOTE - Follow Up Consult  Pharmacy Consult for heparin  Indication: atrial fibrillation  Allergies  Allergen Reactions  . Lactose Intolerance (Gi)     Patient Measurements: Height: 5' 10.47" (179 cm) Weight: 288 lb 12.8 oz (131 kg) IBW/kg (Calculated) : 74.09 Heparin Dosing Weight:   Vital Signs: Temp: 97.8 F (36.6 C) (08/21 0457) Temp src: Oral (08/21 0457) BP: 116/40 mmHg (08/21 0457) Pulse Rate: 74 (08/21 0457)  Labs:  Recent Labs  01/21/14 0932 01/21/14 1254 01/22/14 0328 01/22/14 0400 01/23/14 0445  HGB 16.3  --   --  15.0 15.1  HCT 45.5  --   --  43.2 43.9  PLT 237  --   --  222 209  LABPROT  --  37.0* 38.6*  --  29.8*  INR  --  3.74* 3.95*  --  2.84*  CREATININE 1.13  --  1.06  --  1.09    Estimated Creatinine Clearance: 101.2 ml/min (by C-G formula based on Cr of 1.09).   Medications:  Scheduled:  . aspirin EC  81 mg Oral Daily  . atorvastatin  20 mg Oral q1800  . clopidogrel  75 mg Oral Once  . cycloSPORINE  1 drop Both Eyes BID  . metoprolol  5 mg Intravenous Once  . metoprolol succinate  25 mg Oral Daily  . pantoprazole  40 mg Oral Daily  . tamsulosin  0.4 mg Oral QPC supper  . verapamil  180 mg Oral QHS   Infusions:  . sodium chloride Stopped (01/22/14 0400)  . nitroGLYCERIN Stopped (01/22/14 0040)    Assessment: 58 yo male with hx of afib will be started on heparin when INR is <2.  Patient was on coumadin prior to admission; last dose was 01/21/14.  Today's INR down to 2.84 from 3.95. Goal of Therapy:  Heparin level 0.3-0.7 units/ml Monitor platelets by anticoagulation protocol: Yes   Plan:  - INR in am. Start heparin if INR <2  So, Tsz-Yin 01/23/2014,8:37 AM

## 2014-01-23 NOTE — Progress Notes (Signed)
  Echocardiogram 2D Echocardiogram has been performed.  Peter Dunlap 01/23/2014, 4:53 PM

## 2014-01-23 NOTE — Progress Notes (Signed)
BP 96/62.  Dr Kennith Center informed that verapamil is due, inquired whether to hold.  AM Toprol was held due to low bp today.  Tele A-fib 60-70's.  Pt denies dizziness tonight.  MD advised to hold tonight.  Pt denies cp. Reminded pt to call staff for assist before getting up due to hypotension, pt voices understanding.

## 2014-01-23 NOTE — Progress Notes (Signed)
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 Admitted 08/19 w/ DOE. Cath planned once INR < 2.0.  SUBJECTIVE: No chest pain, no shortness of breath  OBJECTIVE Filed Vitals:   01/23/14 0003 01/23/14 0201 01/23/14 0204 01/23/14 0457  BP: 115/67 147/75 147/75 116/40  Pulse: 83 76 70 74  Temp: 97.7 F (36.5 C) 97.9 F (36.6 C)  97.8 F (36.6 C)  TempSrc: Oral Oral  Oral  Resp: 16 17  17   Height:      Weight:  288 lb 12.8 oz (131 kg)    SpO2: 97% 100% 97% 97%    Intake/Output Summary (Last 24 hours) at 01/23/14 0640 Last data filed at 01/22/14 2300  Gross per 24 hour  Intake    850 ml  Output    325 ml  Net    525 ml   Filed Weights   01/21/14 1300 01/22/14 0300 01/23/14 0201  Weight: 302 lb 14.6 oz (137.4 kg) 283 lb 11.7 oz (128.7 kg) 288 lb 12.8 oz (131 kg)    PHYSICAL EXAM General: Well developed, well nourished, male in no acute distress. Head: Normocephalic, atraumatic.  Neck: Supple without bruits, JVD not elevated. Lungs:  Resp regular and unlabored, CTA. Heart: RRR, S1, S2, no S3, S4, 3/6 murmur; no rub. Abdomen: Soft, non-tender, non-distended, BS + x 4.  Extremities: No clubbing, cyanosis, no edema.  Neuro: Alert and oriented X 3. Moves all extremities  spontaneously. Psych: Normal affect.  LABS: CBC: Recent Labs  01/22/14 0400 01/23/14 0445  WBC 7.7 7.1  HGB 15.0 15.1  HCT 43.2 43.9  MCV 88.0 89.8  PLT 222 209   INR: Recent Labs  01/23/14 0445  INR 4.96*   Basic Metabolic Panel: Recent Labs  01/21/14 0932 01/22/14 0328 01/23/14 0445  NA 137 139 138  K 3.4* 3.1* 3.4*  CL 97 100 100  CO2 22 22 23   GLUCOSE 119* 96 116*  BUN 27* 27* 27*  CREATININE 1.13 1.06 1.09  CALCIUM 9.7 9.5 9.4  MG 1.7  --   --     Recent Labs  01/21/14 0942  TROPIPOC 0.08   BNP: Pro B Natriuretic peptide (BNP)  Date/Time Value Ref Range Status  01/21/2014  9:32 AM 1316.0* 0 - 125 pg/mL Final  12/07/2013  4:25 AM 980.3* 0 - 125 pg/mL Final   TELE: Atrial coarse fib/flutter, rate rarely less than 50, no sustained bradycardia, rate generally less than 100     Radiology/Studies: Dg Chest 2 View 01/21/2014   CLINICAL DATA:  Left chest pain  EXAM: CHEST  2 VIEW  COMPARISON:  12/07/2013  FINDINGS: Cardiac enlargement. Negative for heart failure or pneumonia. Lungs are clear.  IMPRESSION: No active cardiopulmonary disease.   Electronically Signed   By: Franchot Gallo  M.D.   On: 01/21/2014 11:26     Current Medications:  . aspirin EC  81 mg Oral Daily  . atorvastatin  20 mg Oral q1800  . clopidogrel  75 mg Oral Once  . cycloSPORINE  1 drop Both Eyes BID  . metoprolol  5 mg Intravenous Once  . metoprolol succinate  25 mg Oral Daily  . pantoprazole  40 mg Oral Daily  . tamsulosin  0.4 mg Oral QPC supper  . verapamil  180 mg Oral QHS   . sodium chloride Stopped (01/22/14 0400)  . nitroGLYCERIN Stopped (01/22/14 0040)    ASSESSMENT AND PLAN: Principal Problem:   Unstable angina - no chest pain on current therapy including aspirin, Plavix, beta blocker. Restart nitroglycerin for recurrent symptoms. At heparin once INR less than 2  Active Problems:   Orthostatic hypotension - patient had some dizziness and systolic blood pressure  decreased from 110 to 80s when going from sitting to standing. We'll give gentle hydration and follow. Continue current beta blocker and verapamil for heart rate control as blood pressure will tolerate    GERD - on protonix    PSVT (paroxysmal supraventricular tachycardia) - none seen overnight    Hypertension - SBP range 90-145 over the last 24 hours. 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 Rate is generally controlled. Continue Toprol and verapamil    Pericardial effusion without cardiac tamponade - echo pending    Diabetes mellitus type 2 in obese - add sliding scale insulin    Obesity (BMI 30-39.9) - heart healthy ADA diet    Sleep apnea by history - follow up with primary care for sleep study    Chronic anticoagulation- Plavix stopped 8/10- Coumadin rersumed 8/11 - now off Coumadin and on Plavix, heparin when INR less than 2.0  Plan: Cath Monday  SignedRosaria Ferries , PA-C 6:40 AM 01/23/2014  I have examined the patient and reviewed assessment and plan and discussed with patient.  Agree with above as stated.  Cath Monday.  INR should be ok for this by that time.  VARANASI,JAYADEEP S.

## 2014-01-24 LAB — BASIC METABOLIC PANEL
Anion gap: 14 (ref 5–15)
BUN: 24 mg/dL — ABNORMAL HIGH (ref 6–23)
CO2: 22 mEq/L (ref 19–32)
Calcium: 9.2 mg/dL (ref 8.4–10.5)
Chloride: 103 mEq/L (ref 96–112)
Creatinine, Ser: 0.97 mg/dL (ref 0.50–1.35)
GFR calc Af Amer: >90 mL/min (ref >90)
GFR calc non Af Amer: 89 mL/min — ABNORMAL LOW (ref >90)
Glucose, Bld: 100 mg/dL — ABNORMAL HIGH (ref 70–99)
Potassium: 4 mEq/L (ref 3.7–5.3)
Sodium: 139 mEq/L (ref 137–147)

## 2014-01-24 LAB — CBC
HCT: 41.6 % (ref 39.0–52.0)
Hemoglobin: 14.5 g/dL (ref 13.0–17.0)
MCH: 30.5 pg (ref 26.0–34.0)
MCHC: 34.9 g/dL (ref 30.0–36.0)
MCV: 87.6 fL (ref 78.0–100.0)
Platelets: 217 10*3/uL (ref 150–400)
RBC: 4.75 MIL/uL (ref 4.22–5.81)
RDW: 12.3 % (ref 11.5–15.5)
WBC: 7.8 10*3/uL (ref 4.0–10.5)

## 2014-01-24 LAB — GLUCOSE, CAPILLARY
Glucose-Capillary: 96 mg/dL (ref 70–99)
Glucose-Capillary: 146 mg/dL — ABNORMAL HIGH (ref 70–99)
Glucose-Capillary: 105 mg/dL — ABNORMAL HIGH (ref 70–99)
Glucose-Capillary: 123 mg/dL — ABNORMAL HIGH (ref 70–99)

## 2014-01-24 LAB — PROTIME-INR
INR: 2.26 — ABNORMAL HIGH (ref 0.00–1.49)
INR: 1.89 — ABNORMAL HIGH (ref 0.00–1.49)
Prothrombin Time: 25 seconds — ABNORMAL HIGH (ref 11.6–15.2)
Prothrombin Time: 21.7 seconds — ABNORMAL HIGH (ref 11.6–15.2)

## 2014-01-24 MED ORDER — GI COCKTAIL ~~LOC~~
30.0000 mL | Freq: Once | ORAL | Status: DC
  Filled 2014-01-24: qty 30

## 2014-01-24 MED ORDER — HEPARIN (PORCINE) IN NACL 100-0.45 UNIT/ML-% IJ SOLN
2300.0000 [IU]/h | INTRAMUSCULAR | Status: DC
  Administered 2014-01-24: 1650 [IU]/h via INTRAVENOUS
  Administered 2014-01-25: 2300 [IU]/h via INTRAVENOUS
  Administered 2014-01-25: 1950 [IU]/h via INTRAVENOUS
  Filled 2014-01-24 (×5): qty 250

## 2014-01-24 MED ORDER — ATORVASTATIN CALCIUM 80 MG PO TABS
80.0000 mg | ORAL_TABLET | Freq: Every day | ORAL | Status: DC
  Administered 2014-01-24 – 2014-01-26 (×3): 80 mg via ORAL
  Filled 2014-01-24 (×4): qty 1

## 2014-01-24 NOTE — Progress Notes (Addendum)
ANTICOAGULATION CONSULT NOTE - Follow Up Consult  Pharmacy Consult for Heparin  Indication: atrial fibrillation (when INR <2)  Labs:  Recent Labs  01/22/14 0328 01/22/14 0400 01/23/14 0445 01/24/14 0449  HGB  --  15.0 15.1 14.5  HCT  --  43.2 43.9 41.6  PLT  --  222 209 217  LABPROT 38.6*  --  29.8* 25.0*  INR 3.95*  --  2.84* 2.26*  CREATININE 1.06  --  1.09 0.97     Assessment: INR still >2 but trending down   Plan:  -Re-check INR at 1800 today  Ledford, James 01/24/2014,6:10 AM   Addendum: 7:25 PM- INR now <2 at 1.89.  Now to start IV heparin for atrial fibrillation.  CBC within normal limits.   Patient Measurements: Height: 5' 10.47" (179 cm) Weight: 292 lb 8.8 oz (132.7 kg) IBW/kg (Calculated) : 74.09 Heparin Dosing Weight: 104.6  Plan: Start heparin at 1650 units/hr. No bolus due to elevated INR.  Heparin level in 6 hours. Daily heparin level and CBC  Sloan Leiter, PharmD, BCPS Clinical Pharmacist [phone] 01/24/2014, 7:31 PM

## 2014-01-24 NOTE — Progress Notes (Addendum)
Subjective: No CP or SOB..  Only occurs when he walks  Objective: Vital signs in last 24 hours: Temp:  [97.8 F (36.6 C)-98.3 F (36.8 C)] 98 F (36.7 C) (08/22 0426) Pulse Rate:  [67-92] 67 (08/22 0426) Resp:  [16-22] 18 (08/22 0426) BP: (86-145)/(51-129) 128/71 mmHg (08/22 0426) SpO2:  [94 %-100 %] 98 % (08/22 0426) Weight:  [292 lb 8.8 oz (132.7 kg)] 292 lb 8.8 oz (132.7 kg) (08/22 0005) Last BM Date: 01/23/14  Intake/Output from previous day: 08/21 0701 - 08/22 0700 In: 8756 [P.O.:720; I.V.:450] Out: 700 [Urine:700] Intake/Output this shift:    Medications Current Facility-Administered Medications  Medication Dose Route Frequency Provider Last Rate Last Dose  . 0.9 %  sodium chloride infusion   Intravenous Continuous [name], MD      . 0.9 %  sodium chloride infusion   Intravenous Continuous Evelene Croon Barrett, PA-C 50 mL/hr at 01/24/14 0301    . acetaminophen (TYLENOL) tablet 650 mg  650 mg Oral Q4H PRN Erlene Quan, PA-C   650 mg at 01/22/14 2144  . ALPRAZolam Duanne Moron) tablet 0.25 mg  0.25 mg Oral BID PRN Erlene Quan, PA-C   0.25 mg at 01/23/14 2153  . alum & mag hydroxide-simeth (MAALOX/MYLANTA) 200-200-20 MG/5ML suspension 30 mL  30 mL Oral PRN [name], MD   30 mL at 01/24/14 0030  . aspirin EC tablet 81 mg  81 mg Oral Daily Erlene Quan, PA-C   81 mg at 01/23/14 1015  . atorvastatin (LIPITOR) tablet 20 mg  20 mg Oral q1800 Lauren Bajbus, RPH   20 mg at 01/23/14 1829  . clopidogrel (PLAVIX) tablet 75 mg  75 mg Oral Once Luke K Kilroy, PA-C      . cycloSPORINE (RESTASIS) 0.05 % ophthalmic emulsion 1 drop  1 drop Both Eyes BID Erlene Quan, PA-C   1 drop at 01/22/14 2144  . gi cocktail (Maalox,Lidocaine,Donnatal)  30 mL Oral Once Stephani Police, MD      . insulin aspart (novoLOG) injection 0-15 Units  0-15 Units Subcutaneous TID WC Evelene Croon Barrett, PA-C   2 Units at 01/23/14 1337  . insulin aspart (novoLOG) injection 0-5 Units  0-5 Units Subcutaneous  QHS Rhonda G Barrett, PA-C      . metoprolol (LOPRESSOR) injection 5 mg  5 mg Intravenous Once Luan Moore, MD      . metoprolol succinate (TOPROL-XL) 24 hr tablet 25 mg  25 mg Oral Daily Erlene Quan, PA-C   25 mg at 01/22/14 1008  . nitroGLYCERIN (NITROSTAT) SL tablet 0.4 mg  0.4 mg Sublingual Q5 min PRN Luan Moore, MD   0.4 mg at 01/21/14 0956  . nitroGLYCERIN (NITROSTAT) SL tablet 0.4 mg  0.4 mg Sublingual Q5 Min x 3 PRN Doreene Burke Kilroy, PA-C      . nitroGLYCERIN 50 mg in dextrose 5 % 250 mL (0.2 mg/mL) infusion  3 mcg/min Intravenous Titrated Erlene Quan, PA-C   5 mcg/min at 01/21/14 2310  . ondansetron (ZOFRAN) injection 4 mg  4 mg Intravenous Q6H PRN Doreene Burke Kilroy, PA-C      . pantoprazole (PROTONIX) EC tablet 40 mg  40 mg Oral Daily Erlene Quan, PA-C   40 mg at 01/23/14 1014  . potassium chloride SA (K-DUR,KLOR-CON) CR tablet 40 mEq  40 mEq Oral Daily Jettie Booze, MD   40 mEq at 01/23/14 1514  . simethicone (MYLICON) chewable tablet 80 mg  80 mg Oral Q6H PRN [name], MD   80 mg at 01/22/14 2313  . tamsulosin (FLOMAX) capsule 0.4 mg  0.4 mg Oral QPC supper Erlene Quan, PA-C   0.4 mg at 01/23/14 1829  . verapamil (CALAN-SR) CR tablet 180 mg  180 mg Oral QHS Doreene Burke Kilroy, PA-C   180 mg at 01/24/14 0030  . zolpidem (AMBIEN) tablet 5 mg  5 mg Oral QHS PRN,MR X 1 Erlene Quan, PA-C        PE: General appearance: alert, cooperative and no distress Lungs: clear to auscultation bilaterally Heart: irregularly irregular rhythm and 2/6 sys MM Extremities: No LEE Pulses: 2+ and symmetric Skin: Warm and dry Neurologic: Grossly normal  Lab Results:   Recent Labs  01/22/14 0400 01/23/14 0445 01/24/14 0449  WBC 7.7 7.1 7.8  HGB 15.0 15.1 14.5  HCT 43.2 43.9 41.6  PLT 222 209 217   BMET  Recent Labs  01/22/14 0328 01/23/14 0445 01/24/14 0449  NA 139 138 139  K 3.1* 3.4* 4.0  CL 100 100 103  CO2 22 23 22   GLUCOSE 96 116* 100*  BUN 27* 27* 24*  CREATININE  1.06 1.09 0.97  CALCIUM 9.5 9.4 9.2   PT/INR  Recent Labs  01/22/14 0328 01/23/14 0445 01/24/14 0449  LABPROT 38.6* 29.8* 25.0*  INR 3.95* 2.84* 2.26*   Study Conclusions  - Left ventricle: Systolic anterior motion of the mitral valve. LVOT gradient difficult to assess. Technically limited study. The cavity size was mildly reduced. Wall thickness was increased in a pattern of severe LVH. There was concentric hypertrophy. The estimated ejection fraction was 70%. Consistent with HOCM. - Mitral valve: SAM of the mitral valve. - Left atrium: The atrium was severely dilated. - Pericardium, extracardiac: A small pericardial effusion was identified posterior to the heart.   Assessment/Plan  58 y.o. male with PMH of moderate to severe 2 Vessel CAD by catheterization in 2012 -- treated medically. He presented to [facility] ER 12/02/13 with ~1 week of worsening exertional dyspnea with mild chest pressure. He was also noted to be in Afib. He ruled In for NSTEMI. He underwent cath/ PCI by Dr Ellyn Hack 12/03/13 with a Promus DES to the LAD. The pt had stable distal RCA-PDA disease of 60-70%. EF was hyperdynamic. Echo done 12/02/13 showed an EF of 70-75%. There was a moderate pericardial effusion noted but no evidence of tamponade. The pt had PAF during that hospitalization and it was decided not to resume Coumadin till later secondary to his pericardia effusion. He saw Dr Lovena Le 12/24/13. The pt was instructed to stop Plavix in 3 weeks and start Coumadin. The pt says he stopped his Plavix 01/12/14 and started Coumadin 01/13/14.   After his PCI the pt says he felt better "for a few days" but has continued to have DOE and chest discomfort since discharge. His symptoms have worsened over the past two weeks, and became severe this am. He was supposed to see Dr Lovena Le this am but came to the ER after he had SOB and chest pian while at cardiac rehab in [place]. In the ER his in noted to be in AF with VR  around 100. CXR suggest vascular congestion, BNP 1316, Troponin 0.08. He is comfortable at rest but has dyspnea with any exertion.   Principal Problem:   Unstable angina Active Problems:   GERD   PSVT (paroxysmal supraventricular tachycardia)   Hypertension   Arteriosclerotic cardiovascular disease (ASCVD)-  LAD DES 12/03/13   Cerebrovascular disease   Hyperlipidemia   Hypertrophic cardiomyopathy   PAF (paroxysmal atrial fibrillation)   Pericardial effusion without cardiac tamponade   Diabetes mellitus type 2 in obese   Obesity (BMI 30-39.9)   Sleep apnea by history   Chronic anticoagulation- Plavix stopped 8/10- Coumadin rersumed 8/11   Hypokalemia  Plan:   One negative troponin.  Concern for stent thrombosis.  The plan is for [facility] when INR < 1.7.  It is trending down.  2.84>>2.26.  On ASA/plavix, toprolol.  Add IV heparin when INR < 2.0.    BP stable.  HR controlled in aflutter.  ECHO:  EF 70%, small pericardial effusion which is decreased from prior.  Severe LVH.  Transfer to tele floor.  Potassium improved.    LOS: 3 days    HAGER, BRYAN PA-C 01/24/2014 7:08 AM  The issue of anticoagulation/antiplatlet therapy willneed resolution and would probably favor long term therapy with Plavix/Coumadin  For cath on mon Will change statin to high intensity eventhough the most recent LDL of 54  Sleep disordered breathing and daytime somnolence  Will need outpt sleep study

## 2014-01-24 NOTE — Progress Notes (Signed)
Pt having trouble sleeping despite taking xanax.  Has been up with c/o epigastric discomfort, describes as both burning and sharp.  Maalox brought no relief.  While awake with this pain, tele showed 6 beats vtach, see strip on chart.  BP 114/69.  Tele shows a-fib 90's.  Verapamil given due to BP and HR up from previous.  Dr Kennith Center informed, EKG done, order received for GI coctail but pt went back to sleep before med arrived from pharmacy.

## 2014-01-25 LAB — CBC
HCT: 40.5 % (ref 39.0–52.0)
Hemoglobin: 13.5 g/dL (ref 13.0–17.0)
MCH: 29.4 pg (ref 26.0–34.0)
MCHC: 33.3 g/dL (ref 30.0–36.0)
MCV: 88.2 fL (ref 78.0–100.0)
Platelets: 208 10*3/uL (ref 150–400)
RBC: 4.59 MIL/uL (ref 4.22–5.81)
RDW: 12.4 % (ref 11.5–15.5)
WBC: 8.4 10*3/uL (ref 4.0–10.5)

## 2014-01-25 LAB — HEPARIN LEVEL (UNFRACTIONATED)
Heparin Unfractionated: <0.1 IU/mL — ABNORMAL LOW (ref 0.30–0.70)
Heparin Unfractionated: 0.11 IU/mL — ABNORMAL LOW (ref 0.30–0.70)
Heparin Unfractionated: 0.51 IU/mL (ref 0.30–0.70)

## 2014-01-25 LAB — PROTIME-INR
INR: 1.79 — ABNORMAL HIGH (ref 0.00–1.49)
Prothrombin Time: 20.8 seconds — ABNORMAL HIGH (ref 11.6–15.2)

## 2014-01-25 LAB — GLUCOSE, CAPILLARY
Glucose-Capillary: 106 mg/dL — ABNORMAL HIGH (ref 70–99)
Glucose-Capillary: 105 mg/dL — ABNORMAL HIGH (ref 70–99)
Glucose-Capillary: 109 mg/dL — ABNORMAL HIGH (ref 70–99)
Glucose-Capillary: 101 mg/dL — ABNORMAL HIGH (ref 70–99)

## 2014-01-25 LAB — BASIC METABOLIC PANEL
Anion gap: 13 (ref 5–15)
BUN: 20 mg/dL (ref 6–23)
CO2: 20 mEq/L (ref 19–32)
Calcium: 8.9 mg/dL (ref 8.4–10.5)
Chloride: 104 mEq/L (ref 96–112)
Creatinine, Ser: 0.94 mg/dL (ref 0.50–1.35)
GFR calc Af Amer: >90 mL/min (ref >90)
GFR calc non Af Amer: >90 mL/min (ref >90)
Glucose, Bld: 93 mg/dL (ref 70–99)
Potassium: 4.4 mEq/L (ref 3.7–5.3)
Sodium: 137 mEq/L (ref 137–147)

## 2014-01-25 MED ORDER — DIPHENHYDRAMINE HCL 25 MG PO CAPS
25.0000 mg | ORAL_CAPSULE | Freq: Four times a day (QID) | ORAL | Status: DC | PRN
  Administered 2014-01-25 (×3): 25 mg via ORAL
  Filled 2014-01-25 (×3): qty 1

## 2014-01-25 MED ORDER — SODIUM CHLORIDE 0.9 % IV SOLN
1.0000 mL/kg/h | INTRAVENOUS | Status: DC
  Administered 2014-01-26: 1 mL/kg/h via INTRAVENOUS

## 2014-01-25 MED ORDER — SODIUM CHLORIDE 0.9 % IV SOLN: 250.0000 mL | INTRAVENOUS | Status: DC | PRN

## 2014-01-25 MED ORDER — ASPIRIN 81 MG PO CHEW
324.0000 mg | CHEWABLE_TABLET | ORAL | Status: AC
  Administered 2014-01-26: 324 mg via ORAL
  Filled 2014-01-25: qty 4

## 2014-01-25 MED ORDER — SODIUM CHLORIDE 0.9 % IJ SOLN: 3.0000 mL | INTRAMUSCULAR | Status: DC | PRN

## 2014-01-25 MED ORDER — SODIUM CHLORIDE 0.9 % IJ SOLN: 3.0000 mL | Freq: Two times a day (BID) | INTRAMUSCULAR | Status: DC

## 2014-01-25 NOTE — Progress Notes (Signed)
ANTICOAGULATION CONSULT NOTE - Follow Up Consult  Pharmacy Consult for Heparin  Indication: chest pain/ACS, while warfarin on hold  Allergies  Allergen Reactions  . Lactose Intolerance (Gi)     Labs:  Recent Labs  01/23/14 0445 01/24/14 0449 01/24/14 1830 01/25/14 0046  HGB 15.1 14.5  --  13.5  HCT 43.9 41.6  --  40.5  PLT 209 217  --  208  LABPROT 29.8* 25.0* 21.7* 20.8*  INR 2.84* 2.26* 1.89* 1.79*  HEPARINUNFRC  --   --   --  <0.10*  CREATININE 1.09 0.97  --  0.94   Assessment: 58 y/o M with sub-therapeutic heparin level. No issues per RN. Other labs as above.   Goal of Therapy:  Heparin level 0.3-0.7 units/ml Monitor platelets by anticoagulation protocol: Yes   Plan:  -Increase heparin to 1950 units/hr -1000 HL -Daily CBC/HL -Monitor for bleeding  [name] 01/25/2014,2:24 AM

## 2014-01-25 NOTE — Progress Notes (Signed)
Patient ID: Peter Dunlap, male   DOB: 1955/07/06, 58 y.o.   MRN: [id number]    Subjective: No CP or SOB..  Only occurs when he walks  Objective: Vital signs in last 24 hours: Temp:  [97.6 F (36.4 C)-99.2 F (37.3 C)] 99.2 F (37.3 C) (08/23 0433) Pulse Rate:  [91-99] 99 (08/23 0433) Resp:  [18-19] 18 (08/23 0433) BP: (108-137)/(31-69) 137/69 mmHg (08/23 0433) SpO2:  [95 %-100 %] 97 % (08/23 0433) Weight:  [293 lb 12.8 oz (133.267 kg)] 293 lb 12.8 oz (133.267 kg) (08/23 0433) Last BM Date: 01/23/14  Intake/Output from previous day: 08/22 0701 - 08/23 0700 In: 440 [P.O.:440] Out: -  Intake/Output this shift: Total I/O In: 120 [P.O.:120] Out: -   Medications Current Facility-Administered Medications  Medication Dose Route Frequency Provider Last Rate Last Dose  . 0.9 %  sodium chloride infusion   Intravenous Continuous [name], MD      . 0.9 %  sodium chloride infusion   Intravenous Continuous Evelene Croon Barrett, PA-C 50 mL/hr at 01/24/14 2210    . acetaminophen (TYLENOL) tablet 650 mg  650 mg Oral Q4H PRN Erlene Quan, PA-C   650 mg at 01/22/14 2144  . ALPRAZolam Duanne Moron) tablet 0.25 mg  0.25 mg Oral BID PRN Erlene Quan, PA-C   0.25 mg at 01/23/14 2153  . alum & mag hydroxide-simeth (MAALOX/MYLANTA) 200-200-20 MG/5ML suspension 30 mL  30 mL Oral PRN [name], MD   30 mL at 01/25/14 0058  . aspirin EC tablet 81 mg  81 mg Oral Daily Erlene Quan, PA-C   81 mg at 01/25/14 5053  . atorvastatin (LIPITOR) tablet 80 mg  80 mg Oral q1800 Deboraha Sprang, MD   80 mg at 01/24/14 1724  . clopidogrel (PLAVIX) tablet 75 mg  75 mg Oral Once [employer], PA-C      . cycloSPORINE (RESTASIS) 0.05 % ophthalmic emulsion 1 drop  1 drop Both Eyes BID Erlene Quan, PA-C   1 drop at 01/24/14 9767  . diphenhydrAMINE (BENADRYL) capsule 25 mg  25 mg Oral Q6H PRN Minus Breeding, MD   25 mg at 01/25/14 0955  . gi cocktail (Maalox,Lidocaine,Donnatal)  30 mL Oral Once Stephani Police, MD       . heparin ADULT infusion 100 units/mL (25000 units/250 mL)  1,950 Units/hr Intravenous Continuous [name], RPH 19.5 mL/hr at 01/25/14 0955 1,950 Units/hr at 01/25/14 0955  . insulin aspart (novoLOG) injection 0-15 Units  0-15 Units Subcutaneous TID WC Rhonda G Barrett, PA-C   2 Units at 01/23/14 1337  . insulin aspart (novoLOG) injection 0-5 Units  0-5 Units Subcutaneous QHS Rhonda G Barrett, PA-C      . metoprolol (LOPRESSOR) injection 5 mg  5 mg Intravenous Once Luan Moore, MD      . metoprolol succinate (TOPROL-XL) 24 hr tablet 25 mg  25 mg Oral Daily Erlene Quan, PA-C   25 mg at 01/25/14 0955  . nitroGLYCERIN (NITROSTAT) SL tablet 0.4 mg  0.4 mg Sublingual Q5 min PRN Luan Moore, MD   0.4 mg at 01/21/14 0956  . nitroGLYCERIN (NITROSTAT) SL tablet 0.4 mg  0.4 mg Sublingual Q5 Min x 3 PRN Doreene Burke [place], PA-C      . nitroGLYCERIN 50 mg in dextrose 5 % 250 mL (0.2 mg/mL) infusion  3 mcg/min Intravenous Titrated Erlene Quan, PA-C   5 mcg/min at 01/21/14 2310  . ondansetron (ZOFRAN) injection  4 mg  4 mg Intravenous Q6H PRN Erlene Quan, PA-C      . pantoprazole (PROTONIX) EC tablet 40 mg  40 mg Oral Daily Erlene Quan, PA-C   40 mg at 01/25/14 1000  . simethicone (MYLICON) chewable tablet 80 mg  80 mg Oral Q6H PRN [name], MD   80 mg at 01/22/14 2313  . tamsulosin (FLOMAX) capsule 0.4 mg  0.4 mg Oral QPC supper Erlene Quan, PA-C   0.4 mg at 01/24/14 1724  . verapamil (CALAN-SR) CR tablet 180 mg  180 mg Oral QHS Doreene Burke [place], PA-C   180 mg at 01/24/14 2205  . zolpidem (AMBIEN) tablet 5 mg  5 mg Oral QHS PRN,MR X 1 Erlene Quan, PA-C        PE: General appearance: alert, cooperative and no distress Lungs: clear to auscultation bilaterally Heart: irregularly irregular rhythm and 2/6 sys MM Extremities: No LEE Pulses: 2+ and symmetric Skin: Warm and dry Neurologic: Grossly normal  Lab Results:   Recent Labs  01/23/14 0445 01/24/14 0449 01/25/14 0046  WBC 7.1  7.8 8.4  HGB 15.1 14.5 13.5  HCT 43.9 41.6 40.5  PLT 209 217 208   BMET  Recent Labs  01/23/14 0445 01/24/14 0449 01/25/14 0046  NA 138 139 137  K 3.4* 4.0 4.4  CL 100 103 104  CO2 23 22 20   GLUCOSE 116* 100* 93  BUN 27* 24* 20  CREATININE 1.09 0.97 0.94  CALCIUM 9.4 9.2 8.9   PT/INR  Recent Labs  01/24/14 0449 01/24/14 1830 01/25/14 0046  LABPROT 25.0* 21.7* 20.8*  INR 2.26* 1.89* 1.79*   Study Conclusions  - Left ventricle: Systolic anterior motion of the mitral valve. LVOT gradient difficult to assess. Technically limited study. The cavity size was mildly reduced. Wall thickness was increased in a pattern of severe LVH. There was concentric hypertrophy. The estimated ejection fraction was 70%. Consistent with HOCM. - Mitral valve: SAM of the mitral valve. - Left atrium: The atrium was severely dilated. - Pericardium, extracardiac: A small pericardial effusion was identified posterior to the heart.   Assessment/Plan  58 y.o. male with PMH of moderate to severe 2 Vessel CAD by catheterization in 2012 -- treated medically. He presented to [facility] ER 12/02/13 with ~1 week of worsening exertional dyspnea with mild chest pressure. He was also noted to be in Afib. He ruled In for NSTEMI. He underwent cath/ PCI by Dr Ellyn Hack 12/03/13 with a Promus DES to the LAD. The pt had stable distal RCA-PDA disease of 60-70%. EF was hyperdynamic. Echo done 12/02/13 showed an EF of 70-75%. There was a moderate pericardial effusion noted but no evidence of tamponade. The pt had PAF during that hospitalization and it was decided not to resume Coumadin till later secondary to his pericardia effusion. He saw Dr Lovena Le 12/24/13. The pt was instructed to stop Plavix in 3 weeks and start Coumadin. The pt says he stopped his Plavix 01/12/14 and started Coumadin 01/13/14.   After his PCI the pt says he felt better "for a few days" but has continued to have DOE and chest discomfort since  discharge. His symptoms have worsened over the past two weeks, and became severe this am. He was supposed to see Dr Lovena Le this am but came to the ER after he had SOB and chest pian while at cardiac rehab in [place]. In the ER his in noted to be in AF with VR  around 100. CXR suggest vascular congestion, BNP 1316, Troponin 0.08. He is comfortable at rest but has dyspnea with any exertion.   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  Concern for stent thrombosis.  The plan is for [facility] Monday INR 1.79 and heparin started   BP stable.  HR controlled in aflutter.  ECHO:  EF 70%, small pericardial effusion which is decreased from prior.  Severe LVH.      LOS: 4 days    Jenkins Rouge PA-C 01/25/2014 10:24 AM

## 2014-01-25 NOTE — Progress Notes (Addendum)
ANTICOAGULATION CONSULT NOTE - Follow Up Consult  Pharmacy Consult for Heparin  Indication: chest pain/ACS, while warfarin on hold  Allergies  Allergen Reactions  . Lactose Intolerance (Gi)    Patient Measurements:  Height: 5' 10.47" (179 cm)  Weight: 288 lb 12.8 oz (131 kg)  IBW/kg (Calculated) : 74.09  Heparin Dosing Weight: 104 kg    Labs:  Recent Labs  01/23/14 0445 01/24/14 0449 01/24/14 1830 01/25/14 0046 01/25/14 0940  HGB 15.1 14.5  --  13.5  --   HCT 43.9 41.6  --  40.5  --   PLT 209 217  --  208  --   LABPROT 29.8* 25.0* 21.7* 20.8*  --   INR 2.84* 2.26* 1.89* 1.79*  --   HEPARINUNFRC  --   --   --  <0.10* 0.11*  CREATININE 1.09 0.97  --  0.94  --    Assessment: 58 y/o M on heparin for ACS. HL this AM remains sub-therapeutic at 0.11. RN noted some brown sputum this morning but no other s/s of possible bleeding. Other labs as above.   Goal of Therapy:  Heparin level 0.3-0.7 units/ml Monitor platelets by anticoagulation protocol: Yes   Plan:  -Increase heparin to 2300 units/hr. Instructed RN to notify pharmacy if any symptoms of bleeding are noticed.  -1800 HL  -Daily CBC/HL -Monitor for bleeding  Albertina Parr, PharmD.  Clinical Pharmacist Pager [phone]   Addum:  Repaet HL 0.51 units/ml.  F/u am labs.  Excell Seltzer, PharmD

## 2014-01-26 ENCOUNTER — Encounter (HOSPITAL_COMMUNITY)
Admission: EM | Disposition: A | Discharge status: Home / Self Care | Payer: PRIVATE HEALTH INSURANCE | Source: Home / Self Care | Attending: Internal Medicine

## 2014-01-26 ENCOUNTER — Encounter (HOSPITAL_COMMUNITY): Payer: PRIVATE HEALTH INSURANCE

## 2014-01-26 DIAGNOSIS — I2510 Atherosclerotic heart disease of native coronary artery without angina pectoris: Principal | ICD-10-CM

## 2014-01-26 DIAGNOSIS — E119 Type 2 diabetes mellitus without complications: Secondary | ICD-10-CM

## 2014-01-26 HISTORY — PX: LEFT HEART CATHETERIZATION WITH CORONARY ANGIOGRAM: SHX5451

## 2014-01-26 LAB — CBC
HCT: 39.5 % (ref 39.0–52.0)
Hemoglobin: 13.5 g/dL (ref 13.0–17.0)
MCH: 30.6 pg (ref 26.0–34.0)
MCHC: 34.2 g/dL (ref 30.0–36.0)
MCV: 89.6 fL (ref 78.0–100.0)
Platelets: 210 10*3/uL (ref 150–400)
RBC: 4.41 MIL/uL (ref 4.22–5.81)
RDW: 12.6 % (ref 11.5–15.5)
WBC: 8.4 10*3/uL (ref 4.0–10.5)

## 2014-01-26 LAB — BASIC METABOLIC PANEL
Anion gap: 10 (ref 5–15)
BUN: 15 mg/dL (ref 6–23)
CO2: 22 mEq/L (ref 19–32)
Calcium: 8.8 mg/dL (ref 8.4–10.5)
Chloride: 106 mEq/L (ref 96–112)
Creatinine, Ser: 1.07 mg/dL (ref 0.50–1.35)
GFR calc Af Amer: 87 mL/min — ABNORMAL LOW (ref >90)
GFR calc non Af Amer: 75 mL/min — ABNORMAL LOW (ref >90)
Glucose, Bld: 96 mg/dL (ref 70–99)
Potassium: 4.4 mEq/L (ref 3.7–5.3)
Sodium: 138 mEq/L (ref 137–147)

## 2014-01-26 LAB — HEPARIN LEVEL (UNFRACTIONATED)
Heparin Unfractionated: 0.44 IU/mL (ref 0.30–0.70)
Heparin Unfractionated: <0.1 IU/mL — ABNORMAL LOW (ref 0.30–0.70)

## 2014-01-26 LAB — GLUCOSE, CAPILLARY
Glucose-Capillary: 101 mg/dL — ABNORMAL HIGH (ref 70–99)
Glucose-Capillary: 103 mg/dL — ABNORMAL HIGH (ref 70–99)
Glucose-Capillary: 100 mg/dL — ABNORMAL HIGH (ref 70–99)
Glucose-Capillary: 97 mg/dL (ref 70–99)

## 2014-01-26 LAB — PROTIME-INR
INR: 1.5 — ABNORMAL HIGH (ref 0.00–1.49)
Prothrombin Time: 18.1 seconds — ABNORMAL HIGH (ref 11.6–15.2)

## 2014-01-26 LAB — POCT ACTIVATED CLOTTING TIME
Activated Clotting Time: 304 seconds
Activated Clotting Time: 292 seconds

## 2014-01-26 SURGERY — LEFT HEART CATHETERIZATION WITH CORONARY ANGIOGRAM
Anesthesia: LOCAL

## 2014-01-26 MED ORDER — FENTANYL CITRATE 0.05 MG/ML IJ SOLN
INTRAMUSCULAR | Status: AC
  Filled 2014-01-26: qty 2

## 2014-01-26 MED ORDER — HEPARIN SODIUM (PORCINE) 1000 UNIT/ML IJ SOLN
INTRAMUSCULAR | Status: AC
  Filled 2014-01-26: qty 1

## 2014-01-26 MED ORDER — ONDANSETRON HCL 4 MG/2ML IJ SOLN: 4.0000 mg | Freq: Four times a day (QID) | INTRAMUSCULAR | Status: DC | PRN

## 2014-01-26 MED ORDER — MIDAZOLAM HCL 2 MG/2ML IJ SOLN
INTRAMUSCULAR | Status: AC
  Filled 2014-01-26: qty 2

## 2014-01-26 MED ORDER — WARFARIN SODIUM 5 MG PO TABS
5.0000 mg | ORAL_TABLET | Freq: Once | ORAL | Status: AC
  Administered 2014-01-26: 5 mg via ORAL
  Filled 2014-01-26: qty 1

## 2014-01-26 MED ORDER — HEPARIN (PORCINE) IN NACL 2-0.9 UNIT/ML-% IJ SOLN
INTRAMUSCULAR | Status: AC
  Filled 2014-01-26: qty 2000

## 2014-01-26 MED ORDER — LIDOCAINE HCL (PF) 1 % IJ SOLN
INTRAMUSCULAR | Status: AC
  Filled 2014-01-26: qty 30

## 2014-01-26 MED ORDER — VERAPAMIL HCL 2.5 MG/ML IV SOLN
INTRAVENOUS | Status: AC
Start: 2014-01-26 — End: 2014-01-26
  Filled 2014-01-26: qty 2

## 2014-01-26 MED ORDER — HEPARIN (PORCINE) IN NACL 100-0.45 UNIT/ML-% IJ SOLN
2500.0000 [IU]/h | INTRAMUSCULAR | Status: DC
  Administered 2014-01-26: 2300 [IU]/h via INTRAVENOUS
  Administered 2014-01-27: 2500 [IU]/h via INTRAVENOUS
  Filled 2014-01-26 (×4): qty 250

## 2014-01-26 MED ORDER — VERAPAMIL HCL 2.5 MG/ML IV SOLN
INTRAVENOUS | Status: AC
  Filled 2014-01-26: qty 2

## 2014-01-26 MED ORDER — CLOPIDOGREL BISULFATE 300 MG PO TABS
ORAL_TABLET | ORAL | Status: AC
  Filled 2014-01-26: qty 2

## 2014-01-26 MED ORDER — NITROGLYCERIN 1 MG/10 ML FOR IR/CATH LAB
INTRA_ARTERIAL | Status: AC
  Filled 2014-01-26: qty 10

## 2014-01-26 MED ORDER — ASPIRIN 81 MG PO CHEW
81.0000 mg | CHEWABLE_TABLET | Freq: Every day | ORAL | Status: DC
  Filled 2014-01-26: qty 1

## 2014-01-26 MED ORDER — ACETAMINOPHEN 325 MG PO TABS: 650.0000 mg | ORAL_TABLET | ORAL | Status: DC | PRN

## 2014-01-26 MED ORDER — CLOPIDOGREL BISULFATE 75 MG PO TABS
75.0000 mg | ORAL_TABLET | Freq: Every day | ORAL | Status: DC
  Administered 2014-01-27: 11:00:00 75 mg via ORAL
  Filled 2014-01-26: qty 1

## 2014-01-26 MED ORDER — SODIUM CHLORIDE 0.9 % IV SOLN
INTRAVENOUS | Status: AC
  Administered 2014-01-26: 12:00:00 via INTRAVENOUS

## 2014-01-26 MED ORDER — WARFARIN - PHARMACIST DOSING INPATIENT: Freq: Every day | Status: DC

## 2014-01-26 NOTE — Progress Notes (Signed)
ANTICOAGULATION CONSULT NOTE - Initial Consult  Pharmacy Consult for heparin and coumadin Indication: atrial fibrillation  Allergies  Allergen Reactions  . Lactose Intolerance (Gi)     Patient Measurements: Height: 5' 10.47" (179 cm) Weight: 296 lb 4.8 oz (134.4 kg) IBW/kg (Calculated) : 74.09 Heparin Dosing Weight: 104 kg  Vital Signs: Temp: 98.7 F (37.1 C) (08/24 0442) Temp src: Oral (08/24 0442) BP: 104/53 mmHg (08/24 0442) Pulse Rate: 68 (08/24 0912)  Labs:  Recent Labs  01/24/14 0449 01/24/14 1830  01/25/14 0046 01/25/14 0940 01/25/14 1754 01/26/14 0410  HGB 14.5  --   --  13.5  --   --  13.5  HCT 41.6  --   --  40.5  --   --  39.5  PLT 217  --   --  208  --   --  210  LABPROT 25.0* 21.7*  --  20.8*  --   --  18.1*  INR 2.26* 1.89*  --  1.79*  --   --  1.50*  HEPARINUNFRC  --   --   < > <0.10* 0.11* 0.51 0.44  CREATININE 0.97  --   --  0.94  --   --  1.07  < > = values in this interval not displayed.  Estimated Creatinine Clearance: 104.5 ml/min (by C-G formula based on Cr of 1.07).   Medical History: Past Medical History  Diagnosis Date  . Hypertension     Severe LVH with normal EF  . Arteriosclerotic cardiovascular disease (ASCVD) 2005    catheterization in 10/2010:50% mid LAD, diffuse distal disease, circumflex irregularities, large dominant RCA with a 50% ostial, 70% distal, 60% posterolateral and 70% PDA; normal EF  . Cerebrovascular disease 2010    R. carotid endarterectomy; Duplex in 10/2010-widely patent ICAs, subtotal left vertebral-not thought to be contributing to symptoms  . Hyperlipidemia   . Obesity   . Tobacco abuse     Quit 2014  . Benign prostatic hypertrophy   . Low back pain   . History of PSVT (paroxysmal supraventricular tachycardia) 2005    Diagnosed with ILR; no recurrence following RFA  . Cervical spine disease     CT in 2012-advanced degeneration and spondylosis with moderate spinal stenosis at C3-C6  . H/O: substance abuse      Cocaine, marijuana, alcohol.  Quit 2013.   [name] Syncope   . Gastroesophageal reflux disease   . Depression   . Erectile dysfunction   . Coronary artery disease   . Myocardial infarction   . Anginal pain   . Shortness of breath   . CHF (congestive heart failure)   . H/O hiatal hernia   . Headache(784.0)   . Arthritis   . Carotid artery occlusion   . Pericardial effusion without cardiac tamponade 12/03/2013  . Diabetes mellitus type 2 in obese 12/03/2013  . Non-ST elevation myocardial infarction (NSTEMI), initial episode of care 12/02/2013    DES LAD    Medications:  Scheduled:  . aspirin  81 mg Oral Daily  . aspirin EC  81 mg Oral Daily  . atorvastatin  80 mg Oral q1800  . clopidogrel  75 mg Oral Once  . [START ON 01/27/2014] clopidogrel  75 mg Oral Q breakfast  . cycloSPORINE  1 drop Both Eyes BID  . gi cocktail  30 mL Oral Once  . insulin aspart  0-15 Units Subcutaneous TID WC  . insulin aspart  0-5 Units Subcutaneous QHS  . metoprolol  5 mg  Intravenous Once  . metoprolol succinate  25 mg Oral Daily  . pantoprazole  40 mg Oral Daily  . tamsulosin  0.4 mg Oral QPC supper  . verapamil  180 mg Oral QHS   Infusions:  . sodium chloride Stopped (01/22/14 0400)  . sodium chloride 100 mL/hr at 01/26/14 1055  . heparin      Assessment: 58 yo male with afib s/p cath will be resumed on heparin and coumadin.  INR today is 1.5 and patient was therapeutic on heparin at 2300 units/hr.  Per MD, ok to restart heparin at 1500 today Goal of Therapy:  Heparin level 0.3-0.7 units/ml; INR 2-3 Monitor platelets by anticoagulation protocol: Yes   Plan:  1) Restart heparin at 2300 units/hr at 1500. 6hr heparin level 2) Coumadin 5mg  po x1 3) Daily heparin level, CBC and INR  So, Tsz-Yin 01/26/2014,11:08 AM

## 2014-01-26 NOTE — CV Procedure (Signed)
PROCEDURE:  Left heart catheterization with selective coronary angiography, left ventriculogram.  PCI OM1  INDICATIONS:  Unstable angina  The risks, benefits, and details of the procedure were explained to the patient.  The patient verbalized understanding and wanted to proceed.  Informed written consent was obtained.  PROCEDURE TECHNIQUE:  After Xylocaine anesthesia a 4F slender sheath was placed in the right radial artery with a single anterior needle wall stick.   IV heparin was given. Right coronary angiography was done using a Judkins R4 guide catheter.  Left coronary angiography was done using a Judkins L3.5 guide catheter.  Left ventriculography was done using a pigtail catheter. The intervention was performed. Please see below for details. A TR band was used for hemostasis.   CONTRAST:  Total of 220 cc.  COMPLICATIONS:  None.    HEMODYNAMICS:  Aortic pressure was 139/68; LV pressure was 139/14; LVEDP 23.  There was no gradient between the left ventricle and aorta.    ANGIOGRAPHIC DATA:   The left main coronary artery is widely patent.  The left anterior descending artery is a large vessel which reaches the apex. In the proximal portion, there is moderate, diffuse disease, up to 40%. In the mid vessel, the stent is widely patent the diagonal at the origin of the stent is also widely patent. There is a large diagonal off of the mid LAD past the diagonal which is widely patent. The apical LAD is small after the large diagonal vessel. There does not appear to be any significant disease in the LAD system..  The left circumflex artery is a medium to large size vessel. There is mild disease proximally. There is a large first obtuse marginal vessel which has an 80% eccentric stenosis proximally. The lesion looked significantly worse and cranial views. The more distal vessel is quite large.  The remainder of the circumflex is widely patent with some mild diffuse disease.  The right  coronary artery is a large dominant vessel. There is mild disease in the proximal to mid vessel. At the bifurcation, there is moderate disease before the posterior lateral artery and posterior descending artery. The posterior descending artery is large and patent. The posterior lateral artery is medium size and widely patent. The appearance of the disease at the bifurcation appears improved compared to the prior study in July.  LEFT VENTRICULOGRAM:  Left ventricular angiogram was done in the 30 RAO projection and revealed normal left ventricular wall motion and hyperdynamic systolic function with an estimated ejection fraction of 80 %.  LVEDP was 23 mmHg.  PCI NARRATIVE: Additional IV heparin was given. ACT was used to check that the heparin was therapeutic. We initially tried a CLS 3.0 guiding catheter but this would not reach the left main. We then tried a CLS 3.5 guiding catheter and with multiple manipulations, were able to engage the left main.  A pro-water wire was placed across the area disease in the proximal OM1.  A 2.5 x 15 balloon was used to predilate the lesion. A 2.5 x 20 promus drug eluting stent was then used to treat the area of disease.  The stent was post dilated with a 3.0 x 12 noncompliant balloon with several inflations. There was an excellent angiographic result. Intracoronary nitroglycerin was administered. The patient tolerated the procedure well.  IMPRESSIONS:  1. Patent left main coronary artery. 2. Patent stents in the mid left anterior descending artery. Mild to moderate diffuse disease in the LAD. Patent branches.  3. Medium to large sized left circumflex artery and its branches.  80% proximal OM1 stenosis, successfully treated with a 2.5 x 20 Promus drug-eluting stent, postdilated to greater than 3 mm in diameter. 4. Mild to moderate diffuse disease in the right coronary artery. 5. Hyperdynamic left ventricular systolic function.  LVEDP 23 mmHg.  Ejection fraction 80  %.  RECOMMENDATION:  The anticoagulation regimen will be complicated in this patient due to his atrial fibrillation. Coumadin has been held in anticipation of cardiac cath. Long term, he will benefit from Coumadin. I agree with restarting Coumadin tonight. We'll also restart IV heparin 4 hours after the sheath pull.  For now, he will be on aspirin and Plavix. At discharge, his aspirin can be stopped. His heparin will obviously be stopped at discharge. He should go home on Plavix and Coumadin.  The Plavix should continue for a minimum of 6 months.  He will be watched overnight.  Unclear whether he needs to stay in the hospital to achieve therapeutic anticoagulation or if the INR can be trending up at the time of discharge with close followup.

## 2014-01-26 NOTE — ED Provider Notes (Signed)
History/physical exam/procedure(s) were performed by non-physician practitioner and as supervising physician I was immediately available for consultation/collaboration. I have reviewed all notes and am in agreement with care and plan.   Shaune Pollack, MD 01/26/14 [phone]

## 2014-01-26 NOTE — Progress Notes (Signed)
UR completed Crystal K. Hutchinson, RN, BSN, [place], CCM  01/26/2014 3:41 PM

## 2014-01-26 NOTE — H&P (View-Only) (Signed)
    Subjective:  No chest pain or shortness of breath at rest  Objective:  Vital Signs in the last 24 hours: Temp:  [98 F (36.7 C)-98.7 F (37.1 C)] 98.7 F (37.1 C) (08/24 0442) Pulse Rate:  [63-83] 63 (08/24 0442) Resp:  [18-19] 18 (08/24 0442) BP: (104-114)/(53-93) 104/53 mmHg (08/24 0442) SpO2:  [96 %-98 %] 98 % (08/24 0442) Weight:  [296 lb 4.8 oz (134.4 kg)] 296 lb 4.8 oz (134.4 kg) (08/24 0442)  Intake/Output from previous day: 08/23 0701 - 08/24 0700 In: 240 [P.O.:240] Out: 825 [Urine:825]  Physical Exam: Pt is alert and oriented, obese male in NAD HEENT: normal Neck: JVP - normal Lungs: CTA bilaterally CV: Irregularly irregular without murmur or gallop Abd: soft, NT, Positive BS, obese Ext: Trace pretibial edema bilaterally Skin: warm/dry no rash   Lab Results:  Recent Labs  01/25/14 0046 01/26/14 0410  WBC 8.4 8.4  HGB 13.5 13.5  PLT 208 210    Recent Labs  01/25/14 0046 01/26/14 0410  NA 137 138  K 4.4 4.4  CL 104 106  CO2 20 22  GLUCOSE 93 96  BUN 20 15  CREATININE 0.94 1.07   No results found for this basename: TROPONINI, CK, MB,  in the last 72 hours  Cardiac Studies: 2-D echocardiogram 01/23/2014: Study Conclusions  - Left ventricle: Systolic anterior motion of the mitral valve. LVOT gradient difficult to assess. Technically limited study. The cavity size was mildly reduced. Wall thickness was increased in a pattern of severe LVH. There was concentric hypertrophy. The estimated ejection fraction was 70%. Consistent with HOCM. - Mitral valve: SAM of the mitral valve. - Left atrium: The atrium was severely dilated. - Pericardium, extracardiac: A small pericardial effusion was identified posterior to the heart.  Tele: Personally reviewed: Atrial fibrillation with controlled ventricular response heart rate in the 70s  Assessment/Plan:  1. Unstable angina pectoris 2. Chronic diastolic heart failure with severe LVH (HCM pattern) by  echo 3. Atrial fibrillation, chronic 4. Type 2 diabetes. Excellent glycemic control in the hospital.  The patient is going for cardiac catheterization and possible PCI this morning. Concerns noted about subacute stent thrombosis. With negative enzymes this is extremely unlikely. The patient did have dual antiplatelet therapy stopped early after drug-eluting stent implantation June 30 of this year. If coronary anatomy status stable, would favor 6 months of treatment with warfarin and Plavix. Would discontinue aspirin. Further plans pending his cardiac catheterization results.   Sherren Mocha, M.D. 01/26/2014, 8:42 AM

## 2014-01-26 NOTE — Progress Notes (Signed)
    Subjective:  No chest pain or shortness of breath at rest  Objective:  Vital Signs in the last 24 hours: Temp:  [98 F (36.7 C)-98.7 F (37.1 C)] 98.7 F (37.1 C) (08/24 0442) Pulse Rate:  [63-83] 63 (08/24 0442) Resp:  [18-19] 18 (08/24 0442) BP: (104-114)/(53-93) 104/53 mmHg (08/24 0442) SpO2:  [96 %-98 %] 98 % (08/24 0442) Weight:  [296 lb 4.8 oz (134.4 kg)] 296 lb 4.8 oz (134.4 kg) (08/24 0442)  Intake/Output from previous day: 08/23 0701 - 08/24 0700 In: 240 [P.O.:240] Out: 825 [Urine:825]  Physical Exam: Pt is alert and oriented, obese male in NAD HEENT: normal Neck: JVP - normal Lungs: CTA bilaterally CV: Irregularly irregular without murmur or gallop Abd: soft, NT, Positive BS, obese Ext: Trace pretibial edema bilaterally Skin: warm/dry no rash   Lab Results:  Recent Labs  01/25/14 0046 01/26/14 0410  WBC 8.4 8.4  HGB 13.5 13.5  PLT 208 210    Recent Labs  01/25/14 0046 01/26/14 0410  NA 137 138  K 4.4 4.4  CL 104 106  CO2 20 22  GLUCOSE 93 96  BUN 20 15  CREATININE 0.94 1.07   No results found for this basename: TROPONINI, CK, MB,  in the last 72 hours  Cardiac Studies: 2-D echocardiogram 01/23/2014: Study Conclusions  - Left ventricle: Systolic anterior motion of the mitral valve. LVOT gradient difficult to assess. Technically limited study. The cavity size was mildly reduced. Wall thickness was increased in a pattern of severe LVH. There was concentric hypertrophy. The estimated ejection fraction was 70%. Consistent with HOCM. - Mitral valve: SAM of the mitral valve. - Left atrium: The atrium was severely dilated. - Pericardium, extracardiac: A small pericardial effusion was identified posterior to the heart.  Tele: Personally reviewed: Atrial fibrillation with controlled ventricular response heart rate in the 70s  Assessment/Plan:  1. Unstable angina pectoris 2. Chronic diastolic heart failure with severe LVH (HCM pattern) by  echo 3. Atrial fibrillation, chronic 4. Type 2 diabetes. Excellent glycemic control in the hospital.  The patient is going for cardiac catheterization and possible PCI this morning. Concerns noted about subacute stent thrombosis. With negative enzymes this is extremely unlikely. The patient did have dual antiplatelet therapy stopped early after drug-eluting stent implantation June 30 of this year. If coronary anatomy status stable, would favor 6 months of treatment with warfarin and Plavix. Would discontinue aspirin. Further plans pending his cardiac catheterization results.   Sherren Mocha, M.D. 01/26/2014, 8:42 AM

## 2014-01-26 NOTE — Progress Notes (Signed)
ANTICOAGULATION CONSULT NOTE - Follow-up  Pharmacy Consult for heparin  Indication: atrial fibrillation  Allergies  Allergen Reactions  . Lactose Intolerance (Gi)     Patient Measurements: Height: 5' 10.47" (179 cm) Weight: 296 lb 4.8 oz (134.4 kg) IBW/kg (Calculated) : 74.09 Heparin Dosing Weight: 104 kg  Vital Signs: Temp: 98.8 F (37.1 C) (08/24 1925) Temp src: Oral (08/24 1925) BP: 126/68 mmHg (08/24 1925) Pulse Rate: 69 (08/24 1925)  Labs:  Recent Labs  01/24/14 0449 01/24/14 1830 01/25/14 0046  01/25/14 1754 01/26/14 0410 01/26/14 2040  HGB 14.5  --  13.5  --   --  13.5  --   HCT 41.6  --  40.5  --   --  39.5  --   PLT 217  --  208  --   --  210  --   LABPROT 25.0* 21.7* 20.8*  --   --  18.1*  --   INR 2.26* 1.89* 1.79*  --   --  1.50*  --   HEPARINUNFRC  --   --  <0.10*  < > 0.51 0.44 <0.10*  CREATININE 0.97  --  0.94  --   --  1.07  --   < > = values in this interval not displayed.  Estimated Creatinine Clearance: 104.5 ml/min (by C-G formula based on Cr of 1.07).  Infusions:  . sodium chloride Stopped (01/22/14 0400)  . heparin 2,300 Units/hr (01/26/14 2000)    Assessment: 58 yo male with afib s/p cath was restarted on heparin. Heparin level is undetectable but it appears heparin was started later than ordered so level was drawn only about 2 hours after the drip was resumed. He had previously been therapeutic on the current rate. Therefore will not change rate until heparin level is checked at the appropriate interval.   Goal of Therapy:  Heparin level 0.3-0.7 units/ml Monitor platelets by anticoagulation protocol: Yes   Plan:  1. Continue heparin gtt 2300 units/hr 2. Check a heparin level 8 hours after drip was restarted  Salome Arnt, PharmD, BCPS Pager # [phone] 01/26/2014 9:26 PM

## 2014-01-26 NOTE — Interval H&P Note (Signed)
Cath Lab Visit (complete for each Cath Lab visit)  Clinical Evaluation Leading to the Procedure:   ACS: Yes.    Non-ACS:    Anginal Classification: CCS IV  Anti-ischemic medical therapy: Maximal Therapy (2 or more classes of medications)  Non-Invasive Test Results: No non-invasive testing performed  Prior CABG: No previous CABG      History and Physical Interval Note:  01/26/2014 9:21 AM  Peter Dunlap  has presented today for surgery, with the diagnosis of CP  The various methods of treatment have been discussed with the patient and family. After consideration of risks, benefits and other options for treatment, the patient has consented to  Procedure(s): LEFT HEART CATHETERIZATION WITH CORONARY ANGIOGRAM (N/A) as a surgical intervention .  The patient's history has been reviewed, patient examined, no change in status, stable for surgery.  I have reviewed the patient's chart and labs.  Questions were answered to the patient's satisfaction.     VARANASI,JAYADEEP S.

## 2014-01-27 ENCOUNTER — Other Ambulatory Visit: Payer: Self-pay | Admitting: Internal Medicine

## 2014-01-27 LAB — CBC
HCT: 37.3 % — ABNORMAL LOW (ref 39.0–52.0)
Hemoglobin: 12.5 g/dL — ABNORMAL LOW (ref 13.0–17.0)
MCH: 29.9 pg (ref 26.0–34.0)
MCHC: 33.5 g/dL (ref 30.0–36.0)
MCV: 89.2 fL (ref 78.0–100.0)
Platelets: 194 10*3/uL (ref 150–400)
RBC: 4.18 MIL/uL — ABNORMAL LOW (ref 4.22–5.81)
RDW: 12.6 % (ref 11.5–15.5)
WBC: 6.7 10*3/uL (ref 4.0–10.5)

## 2014-01-27 LAB — HEPARIN LEVEL (UNFRACTIONATED)
Heparin Unfractionated: 0.22 IU/mL — ABNORMAL LOW (ref 0.30–0.70)
Heparin Unfractionated: 0.41 IU/mL (ref 0.30–0.70)

## 2014-01-27 LAB — BASIC METABOLIC PANEL
Anion gap: 11 (ref 5–15)
BUN: 12 mg/dL (ref 6–23)
CO2: 21 mEq/L (ref 19–32)
Calcium: 8.9 mg/dL (ref 8.4–10.5)
Chloride: 106 mEq/L (ref 96–112)
Creatinine, Ser: 0.93 mg/dL (ref 0.50–1.35)
GFR calc Af Amer: >90 mL/min (ref >90)
GFR calc non Af Amer: >90 mL/min (ref >90)
Glucose, Bld: 104 mg/dL — ABNORMAL HIGH (ref 70–99)
Potassium: 4.1 mEq/L (ref 3.7–5.3)
Sodium: 138 mEq/L (ref 137–147)

## 2014-01-27 LAB — PROTIME-INR
INR: 1.41 (ref 0.00–1.49)
Prothrombin Time: 17.3 seconds — ABNORMAL HIGH (ref 11.6–15.2)

## 2014-01-27 LAB — GLUCOSE, CAPILLARY: Glucose-Capillary: 87 mg/dL (ref 70–99)

## 2014-01-27 MED ORDER — WARFARIN SODIUM 5 MG PO TABS
5.0000 mg | ORAL_TABLET | Freq: Once | ORAL | Status: DC
  Filled 2014-01-27: qty 1

## 2014-01-27 MED ORDER — CLOPIDOGREL BISULFATE 75 MG PO TABS: 75.0000 mg | ORAL_TABLET | Freq: Every day | ORAL | Status: DC

## 2014-01-27 NOTE — Progress Notes (Signed)
CARDIAC REHAB PHASE I   PRE:  Rate/Rhythm: 69 afib  BP:  Supine:   Sitting: 136/86  Standing:    SaO2: 96%RA  MODE:  Ambulation: 275 ft   POST:  Rate/Rhythm: 69-70 afib  BP:  Supine:   Sitting: 105/65  Standing:    SaO2: 97%RA 0835-0925 Pt walked 275 ft on RA with cane. Had to stop many times to rest due to SOB and legs tired. Pt stated he has not been able to walk much at all due to SOB. Stated that this walk was better with his breathing even though he appeared DOE. Sats good on RA. Pt very deconditioned. Will send update letter to [facility] as pt had just started program. Brief review of diet. Pt with very good questions. Knows to watch dark green vegetables when on Coumadin. Encouraged pt to continue to use cane when he starts walking and to start slowly. Modified exercise given. Pt to notify MD if his breathing gets worse. Pt stated he has back issues that also affect walking.   Graylon Good, RN BSN  01/27/2014 9:21 AM

## 2014-01-27 NOTE — Progress Notes (Signed)
ANTICOAGULATION CONSULT NOTE - Follow Up Consult  Pharmacy Consult for heparin Indication: atrial fibrillation  Labs:  Recent Labs  01/25/14 0046  01/26/14 0410 01/26/14 2040 01/27/14 0220  HGB 13.5  --  13.5  --  12.5*  HCT 40.5  --  39.5  --  37.3*  PLT 208  --  210  --  194  LABPROT 20.8*  --  18.1*  --  17.3*  INR 1.79*  --  1.50*  --  1.41  HEPARINUNFRC <0.10*  < > 0.44 <0.10* 0.22*  CREATININE 0.94  --  1.07  --  0.93  < > = values in this interval not displayed.   Assessment: 58yo male subtherapeutic on heparin after resumed post-cath; was previously therapeutic at current rate.  Goal of Therapy:  Heparin level 0.3-0.7 units/ml   Plan:  Will increase heparin gtt by ~10% to 2500 units/hr and check level in [place], PharmD, BCPS  01/27/2014,3:04 AM

## 2014-01-27 NOTE — Discharge Summary (Signed)
Doing well after OM DES> plan triple durg therapy for 1 moth then Plavix and coumadin. Home today with early f/u in coumadin clinic.

## 2014-01-27 NOTE — Progress Notes (Signed)
Resume coumadin and use triple therapy for 4 weeks then Coumadin and plavix. INR will be checked early this week. Discharge today if ambulates with no problem.

## 2014-01-27 NOTE — Progress Notes (Signed)
Subjective: No CP or SOB  Objective: Vital signs in last 24 hours: Temp:  [97.4 F (36.3 C)-98.8 F (37.1 C)] 97.4 F (36.3 C) (08/25 0531) Pulse Rate:  [56-79] 56 (08/25 0531) Resp:  [16-20] 20 (08/25 0531) BP: (95-131)/(46-84) 95/46 mmHg (08/25 0531) SpO2:  [93 %-96 %] 93 % (08/25 0531) Weight:  [297 lb 9.9 oz (135 kg)] 297 lb 9.9 oz (135 kg) (08/25 0413) Last BM Date: 01/26/14  Intake/Output from previous day: 08/24 0701 - 08/25 0700 In: 1486.8 [P.O.:360; I.V.:1126.8] Out: 1475 [Urine:1475] Intake/Output this shift:    Medications Current Facility-Administered Medications  Medication Dose Route Frequency Provider Last Rate Last Dose  . 0.9 %  sodium chloride infusion   Intravenous Continuous [name], MD      . acetaminophen (TYLENOL) tablet 650 mg  650 mg Oral Q4H PRN Jettie Booze, MD      . ALPRAZolam Duanne Moron) tablet 0.25 mg  0.25 mg Oral BID PRN Erlene Quan, PA-C   0.25 mg at 01/23/14 2153  . alum & mag hydroxide-simeth (MAALOX/MYLANTA) 200-200-20 MG/5ML suspension 30 mL  30 mL Oral PRN [name], MD   30 mL at 01/25/14 2118  . aspirin chewable tablet 81 mg  81 mg Oral Daily Jettie Booze, MD      . aspirin EC tablet 81 mg  81 mg Oral Daily Erlene Quan, PA-C   81 mg at 01/25/14 0955  . atorvastatin (LIPITOR) tablet 80 mg  80 mg Oral q1800 Deboraha Sprang, MD   80 mg at 01/26/14 3419  . clopidogrel (PLAVIX) tablet 75 mg  75 mg Oral Once Luke K Kilroy, PA-C      . clopidogrel (PLAVIX) tablet 75 mg  75 mg Oral Q breakfast Jettie Booze, MD      . cycloSPORINE (RESTASIS) 0.05 % ophthalmic emulsion 1 drop  1 drop Both Eyes BID Erlene Quan, PA-C   1 drop at 01/24/14 3790  . diphenhydrAMINE (BENADRYL) capsule 25 mg  25 mg Oral Q6H PRN Minus Breeding, MD   25 mg at 01/25/14 2118  . gi cocktail (Maalox,Lidocaine,Donnatal)  30 mL Oral Once Stephani Police, MD      . heparin ADULT infusion 100 units/mL (25000 units/250 mL)  2,500 Units/hr  Intravenous Continuous Rogue Bussing, RPH 25 mL/hr at 01/27/14 0604 2,500 Units/hr at 01/27/14 0604  . insulin aspart (novoLOG) injection 0-15 Units  0-15 Units Subcutaneous TID WC Rhonda G Barrett, PA-C   2 Units at 01/23/14 1337  . insulin aspart (novoLOG) injection 0-5 Units  0-5 Units Subcutaneous QHS Rhonda G Barrett, PA-C      . metoprolol (LOPRESSOR) injection 5 mg  5 mg Intravenous Once Luan Moore, MD      . metoprolol succinate (TOPROL-XL) 24 hr tablet 25 mg  25 mg Oral Daily Erlene Quan, PA-C   25 mg at 01/26/14 1429  . nitroGLYCERIN (NITROSTAT) SL tablet 0.4 mg  0.4 mg Sublingual Q5 min PRN Luan Moore, MD   0.4 mg at 01/21/14 0956  . nitroGLYCERIN (NITROSTAT) SL tablet 0.4 mg  0.4 mg Sublingual Q5 Min x 3 PRN Erlene Quan, PA-C      . ondansetron [facility]) injection 4 mg  4 mg Intravenous Q6H PRN Jettie Booze, MD      . pantoprazole (PROTONIX) EC tablet 40 mg  40 mg Oral Daily Erlene Quan, PA-C   40 mg at 01/26/14 1429  .  simethicone (MYLICON) chewable tablet 80 mg  80 mg Oral Q6H PRN [name], MD   80 mg at 01/22/14 2313  . tamsulosin (FLOMAX) capsule 0.4 mg  0.4 mg Oral QPC supper Erlene Quan, PA-C   0.4 mg at 01/26/14 7425  . verapamil (CALAN-SR) CR tablet 180 mg  180 mg Oral QHS Erlene Quan, PA-C   180 mg at 01/26/14 2131  . Warfarin - Pharmacist Dosing Inpatient   Does not apply q1800 [name], MD      . zolpidem [facility]) tablet 5 mg  5 mg Oral QHS PRN,MR X 1 Erlene Quan, PA-C        PE: General appearance: alert, cooperative and no distress  Lungs: clear to auscultation bilaterally  Heart: irregularly irregular rhythm and 2/6 sys MM  Extremities: No LEE  Pulses: 2+ and symmetric  Skin: Warm and dry  Neurologic: Grossly normal   Lab Results:   Recent Labs  01/25/14 0046 01/26/14 0410 01/27/14 0220  WBC 8.4 8.4 6.7  HGB 13.5 13.5 12.5*  HCT 40.5 39.5 37.3*  PLT 208 210 194   BMET  Recent Labs  01/25/14 0046  01/26/14 0410 01/27/14 0220  NA 137 138 138  K 4.4 4.4 4.1  CL 104 106 106  CO2 20 22 21   GLUCOSE 93 96 104*  BUN 20 15 12   CREATININE 0.94 1.07 0.93  CALCIUM 8.9 8.8 8.9   PT/INR  Recent Labs  01/25/14 0046 01/26/14 0410 01/27/14 0220  LABPROT 20.8* 18.1* 17.3*  INR 1.79* 1.50* 1.41   Cardiac Cath IMPRESSIONS:  1. Patent left main coronary artery. 2. Patent stents in the mid left anterior descending artery. Mild to moderate diffuse disease in the LAD. Patent branches. 3. Medium to large sized left circumflex artery and its branches. 80% proximal OM1 stenosis, successfully treated with a 2.5 x 20 Promus drug-eluting stent, postdilated to greater than 3 mm in diameter. 4. Mild to moderate diffuse disease in the right coronary artery. 5. Hyperdynamic left ventricular systolic function. LVEDP 23 mmHg. Ejection fraction 80 %. 6.   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  Patent stents in the mid left anterior descending artery. Mild to moderate diffuse disease in the LAD. Patent branches.  Mild to moderate diffuse disease in the right coronary artery.  ASA, plavix and coumadin.   INR 1.41.  He should probably stay until INR is therapeutic so we can coordinate stopping ASA.   Ambulate with Cardiac rehab this morning.  On  Toprol 25mg , verapamil 180.  Slow afib on tele.     LOS: 6 days    HAGER, BRYAN PA-C 01/27/2014 7:07 AM

## 2014-01-27 NOTE — Discharge Summary (Signed)
Physician Discharge Summary     Cardiologist:  Lovena Le Patient ID: Peter Dunlap MRN: [id number] DOB/AGE: 1955-06-18 58 y.o.  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 male with PMH of moderate to severe 2 Vessel CAD by catheterization in 2012 -- treated medically. He presented to [facility] ER 12/02/13 with ~1 week of worsening exertional dyspnea with mild chest pressure. He was also noted to be in Afib. He ruled In for NSTEMI. He underwent cath/ PCI by Dr Ellyn Hack 12/03/13 with a Promus DES to the LAD. The pt had stable distal RCA-PDA disease of 60-70%. EF was hyperdynamic. Echo done 12/02/13 showed an EF of 70-75%. There was a moderate pericardial effusion noted but no evidence of tamponade. The pt had PAF during that hospitalization and it was decided not to resume Coumadin till later secondary to his pericardia effusion. He saw Dr Lovena Le 12/24/13. The pt was instructed to stop Plavix in 3 weeks and start Coumadin. The pt says he stopped his Plavix 01/12/14 and started Coumadin 01/13/14.   After his PCI the pt says he felt better "for a few days" but has continued to have DOE and chest discomfort since discharge. His symptoms have worsened over the past two weeks, and became severe this am. He was supposed to see Dr Lovena Le this  am but came to the ER after he had SOB and chest pian while at cardiac rehab in [place]. In the ER his in noted to be in AF with VR around 100. CXR suggest vascular congestion, BNP 1316, Troponin 0.08. He is comfortable at rest but has dyspnea with any exertion  He was admitted and started on IV NTG.  An echo was completed and revealed EF of 70%, severe LVH, small pericardial effusion.  Coumadin was held for left heart cath.   '40mg'$  IV lasix was given  With K+ in the ER.  Plavix was restarted.  Heparin was started when INR dropped below 2.0.  He went for a left heart cath which revealed 80% proximal OM1 stenosis, successfully treated with a 2.5 x 20 Promus drug-eluting stent, postdilated to greater than 3 mm in diameter. Patent stents in the mid left anterior descending artery. Mild to moderate diffuse disease in the LAD. Patent branches. Mild to moderate diffuse disease in the right coronary artery. ASA, plavix and coumadin for 4 weeks then DC ASA.  INR check on Friday.  Continue Toprol, verapamil.  He is scheduled for a sleep study this week. The patient was seen by Dr. Tamala Julian who felt he was stable for DC home.  Consults:    Significant Diagnostic Studies:  Echocardiogram Study Conclusions  - Left ventricle: Systolic anterior motion of the mitral valve. LVOT gradient difficult to assess. Technically limited study. The cavity size was mildly reduced. Wall thickness was increased in a pattern of severe LVH. There was concentric hypertrophy. The  estimated ejection fraction was 70%. Consistent with HOCM. - Mitral valve: SAM of the mitral valve. - Left atrium: The atrium was severely dilated. - Pericardium, extracardiac: A small pericardial effusion was identified posterior to the heart.  PROCEDURE: Left heart catheterization with selective coronary angiography, left ventriculogram. PCI OM1  INDICATIONS: Unstable angina  The risks, benefits, and details of the procedure were explained to the  patient. The patient verbalized understanding and wanted to proceed. Informed written consent was obtained.  PROCEDURE TECHNIQUE: After Xylocaine anesthesia a 68F slender sheath was placed in the right radial artery with a single anterior needle wall stick. IV heparin was given. Right coronary angiography was done using a Judkins R4 guide catheter. Left coronary angiography was done using a Judkins L3.5 guide catheter. Left ventriculography was done using a pigtail catheter. The intervention was performed. Please see below for details. A TR band was used for hemostasis.  CONTRAST: Total of 220 cc.  COMPLICATIONS: None.  HEMODYNAMICS: Aortic pressure was 139/68; LV pressure was 139/14; LVEDP 23. There was no gradient between the left ventricle and aorta.  ANGIOGRAPHIC DATA: The left main coronary artery is widely patent.  The left anterior descending artery is a large vessel which reaches the apex. In the proximal portion, there is moderate, diffuse disease, up to 40%. In the mid vessel, the stent is widely patent the diagonal at the origin of the stent is also widely patent. There is a large diagonal off of the mid LAD past the diagonal which is widely patent. The apical LAD is small after the large diagonal vessel. There does not appear to be any significant disease in the LAD system..  The left circumflex artery is a medium to large size vessel. There is mild disease proximally. There is a large first obtuse marginal vessel which has an 80% eccentric stenosis proximally. The lesion looked significantly worse and cranial views. The more distal vessel is quite large. The remainder of the circumflex is widely patent with some mild diffuse disease.  The right coronary artery is a large dominant vessel. There is mild disease in the proximal to mid vessel. At the bifurcation, there is moderate disease before the posterior lateral artery and posterior descending artery. The posterior descending artery is large and  patent. The posterior lateral artery is medium size and widely patent. The appearance of the disease at the bifurcation appears improved compared to the prior study in July.  LEFT VENTRICULOGRAM: Left ventricular angiogram was done in the 30 RAO projection and revealed normal left ventricular wall motion and hyperdynamic systolic function with an estimated ejection fraction of 80 %. LVEDP was 23 mmHg.  PCI NARRATIVE: Additional IV heparin was given. ACT was used to check that the heparin was therapeutic. We initially tried a CLS 3.0 guiding catheter but this would not reach the left main. We then tried a CLS 3.5 guiding catheter and with multiple manipulations, were able to engage the left main. A pro-water wire was placed across the area disease in the proximal OM1. A 2.5 x 15 balloon was used to predilate the lesion. A 2.5 x 20 promus drug eluting stent was then used to treat the area of disease. The stent was post dilated with a 3.0 x 12 noncompliant balloon with several inflations. There was an excellent angiographic result. Intracoronary nitroglycerin was administered. The patient tolerated the procedure well.  IMPRESSIONS:  1. Patent left main coronary artery. 2. Patent stents in the mid left anterior descending artery. Mild to moderate  diffuse disease in the LAD. Patent branches. 3. Medium to large sized left circumflex artery and its branches. 80% proximal OM1 stenosis, successfully treated with a 2.5 x 20 Promus drug-eluting stent, postdilated to greater than 3 mm in diameter. 4. Mild to moderate diffuse disease in the right coronary artery. 5. Hyperdynamic left ventricular systolic function. LVEDP 23 mmHg. Ejection fraction 80 %. RECOMMENDATION: The anticoagulation regimen will be complicated in this patient due to his atrial fibrillation. Coumadin has been held in anticipation of cardiac cath. Long term, he will benefit from Coumadin. I agree with restarting Coumadin tonight. We'll also restart  IV heparin 4 hours after the sheath pull. For now, he will be on aspirin and Plavix. At discharge, his aspirin can be stopped. His heparin will obviously be stopped at discharge. He should go home on Plavix and Coumadin. The Plavix should continue for a minimum of 6 months.  He will be watched overnight. Unclear whether he needs to stay in the hospital to achieve therapeutic anticoagulation or if the INR can be trending up at the time of discharge with close followup.   Treatments: See above.   Discharge Exam: Blood pressure 113/46, pulse 66, temperature 98.4 F (36.9 C), temperature source Oral, resp. rate 20, height 5' 10.47" (1.79 m), weight 297 lb 9.9 oz (135 kg), SpO2 95.00%.   Disposition: 06-Home-Health Care Svc      Discharge Instructions   Diet - low sodium heart healthy    Complete by:  As directed      Discharge instructions    Complete by:  As directed   No lifting with right arm for three days.     Increase activity slowly    Complete by:  As directed             Medication List    STOP taking these medications       hydrochlorothiazide 12.5 MG capsule  Commonly known as:  MICROZIDE      TAKE these medications       aspirin EC 81 MG tablet  Take 81 mg by mouth daily.     Blood Glucose Monitoring Suppl W/DEVICE Kit  Dispense based on patient/ insurance preference. Use to monitor FSBS once daily. DX: 250.00     clopidogrel 75 MG tablet  Commonly known as:  PLAVIX  Take 1 tablet (75 mg total) by mouth daily with breakfast.     cycloSPORINE 0.05 % ophthalmic emulsion  Commonly known as:  RESTASIS  Place 1 drop into both eyes 2 (two) times daily.     esomeprazole 40 MG capsule  Commonly known as:  NEXIUM  Take 1 capsule (40 mg total) by mouth daily.     metoprolol succinate 25 MG 24 hr tablet  Commonly known as:  TOPROL-XL  Take 1 tablet (25 mg total) by mouth daily.     nitroGLYCERIN 0.4 MG SL tablet  Commonly known as:  NITROSTAT  Place 1 tablet  (0.4 mg total) under the tongue every 5 (five) minutes as needed for chest pain.     RAPAFLO 8 MG Caps capsule  Generic drug:  silodosin  Take 8 mg by mouth daily.     simvastatin 40 MG tablet  Commonly known as:  ZOCOR  Take 40 mg by mouth daily.     verapamil 180 MG CR tablet  Commonly known as:  CALAN-SR  Take 180 mg by mouth at bedtime.     warfarin 5 MG tablet  Commonly known  as:  COUMADIN  Take 5 mg by mouth daily. Except on Monday, Wednesday, and Friday, take 2.$RemoveBefore'5mg'[id number]$ .       Follow-up Information   Follow up with Coumadin Clinic On 01/30/2014. (11:30 AM)    Contact information:   [place] [place] [address] [phone]      Follow up with Truitt Merle, NP On 02/10/2014. (11:30AM)    Specialty:  Nurse Practitioner   Contact information:   [place] [place] [address] [phone]      Greater than 30 minutes was spent completing the patient's discharge.   SignedTarri Fuller, [place] 01/27/2014, 10:30 AM

## 2014-01-27 NOTE — Progress Notes (Signed)
ANTICOAGULATION CONSULT NOTE - Follow Up Consult  Pharmacy Consult for coumadin and heparin Indication: atrial fibrillation  Allergies  Allergen Reactions  . Lactose Intolerance (Gi)     Patient Measurements: Height: 5' 10.47" (179 cm) Weight: 297 lb 9.9 oz (135 kg) IBW/kg (Calculated) : 74.09 Heparin Dosing Weight: 104 kg  Vital Signs: Temp: 98.4 F (36.9 C) (08/25 0734) Temp src: Oral (08/25 0734) BP: 113/46 mmHg (08/25 0734) Pulse Rate: 66 (08/25 0734)  Labs:  Recent Labs  01/25/14 0046  01/26/14 0410 01/26/14 2040 01/27/14 0220  HGB 13.5  --  13.5  --  12.5*  HCT 40.5  --  39.5  --  37.3*  PLT 208  --  210  --  194  LABPROT 20.8*  --  18.1*  --  17.3*  INR 1.79*  --  1.50*  --  1.41  HEPARINUNFRC <0.10*  < > 0.44 <0.10* 0.22*  CREATININE 0.94  --  1.07  --  0.93  < > = values in this interval not displayed.  Estimated Creatinine Clearance: 120.6 ml/min (by C-G formula based on Cr of 0.93).   Medications:  Scheduled:  . aspirin  81 mg Oral Daily  . aspirin EC  81 mg Oral Daily  . atorvastatin  80 mg Oral q1800  . clopidogrel  75 mg Oral Once  . clopidogrel  75 mg Oral Q breakfast  . cycloSPORINE  1 drop Both Eyes BID  . gi cocktail  30 mL Oral Once  . insulin aspart  0-15 Units Subcutaneous TID WC  . insulin aspart  0-5 Units Subcutaneous QHS  . metoprolol  5 mg Intravenous Once  . metoprolol succinate  25 mg Oral Daily  . pantoprazole  40 mg Oral Daily  . tamsulosin  0.4 mg Oral QPC supper  . verapamil  180 mg Oral QHS  . Warfarin - Pharmacist Dosing Inpatient   Does not apply q1800   Infusions:  . sodium chloride Stopped (01/22/14 0400)  . heparin 2,500 Units/hr (01/27/14 7035)    Assessment: 58 yo male with afib is currently on subtherapeutic coumadin bridging with therapeutic heparin.  INR today is 1.41, heparin level is 0.41, Hgb 12.5 and Plt 194 K.  Goal of Therapy:  Heparin level 0.3-0.7 units/ml ; INR 2-3  Monitor platelets by  anticoagulation protocol: Yes   Plan:  - Continue heparin at 2500 units/hr.  - Coumadin 5mg  po x1 - Monitor daily HL, CBC, INR and s/s of bleeding    So, Tsz-Yin 01/27/2014,8:28 AM

## 2014-01-28 ENCOUNTER — Encounter (HOSPITAL_COMMUNITY): Payer: PRIVATE HEALTH INSURANCE

## 2014-01-30 ENCOUNTER — Encounter (HOSPITAL_COMMUNITY): Payer: PRIVATE HEALTH INSURANCE

## 2014-01-30 ENCOUNTER — Ambulatory Visit (INDEPENDENT_AMBULATORY_CARE_PROVIDER_SITE_OTHER): Payer: PRIVATE HEALTH INSURANCE | Admitting: *Deleted

## 2014-01-30 ENCOUNTER — Ambulatory Visit: Payer: PRIVATE HEALTH INSURANCE | Attending: Family Medicine | Admitting: Sleep Medicine

## 2014-01-30 VITALS — Ht 70.0 in | Wt 303.0 lb

## 2014-01-30 DIAGNOSIS — I4891 Unspecified atrial fibrillation: Secondary | ICD-10-CM | POA: Diagnosis not present

## 2014-01-30 DIAGNOSIS — I480 Paroxysmal atrial fibrillation: Secondary | ICD-10-CM

## 2014-01-30 DIAGNOSIS — G4730 Sleep apnea, unspecified: Secondary | ICD-10-CM

## 2014-01-30 DIAGNOSIS — Z6841 Body Mass Index (BMI) 40.0 and over, adult: Secondary | ICD-10-CM | POA: Insufficient documentation

## 2014-01-30 DIAGNOSIS — Z5181 Encounter for therapeutic drug level monitoring: Secondary | ICD-10-CM

## 2014-01-30 DIAGNOSIS — G4733 Obstructive sleep apnea (adult) (pediatric): Secondary | ICD-10-CM | POA: Diagnosis not present

## 2014-01-30 DIAGNOSIS — Z79899 Other long term (current) drug therapy: Secondary | ICD-10-CM | POA: Insufficient documentation

## 2014-01-30 DIAGNOSIS — R0609 Other forms of dyspnea: Secondary | ICD-10-CM | POA: Diagnosis present

## 2014-01-30 DIAGNOSIS — R0989 Other specified symptoms and signs involving the circulatory and respiratory systems: Secondary | ICD-10-CM | POA: Diagnosis present

## 2014-01-30 LAB — POCT INR: INR: 3.1

## 2014-02-01 ENCOUNTER — Other Ambulatory Visit: Payer: Self-pay | Admitting: Internal Medicine

## 2014-02-02 ENCOUNTER — Encounter (HOSPITAL_COMMUNITY): Payer: PRIVATE HEALTH INSURANCE

## 2014-02-03 ENCOUNTER — Telehealth: Payer: Self-pay | Admitting: Family Medicine

## 2014-02-03 NOTE — Telephone Encounter (Signed)
MD please advise

## 2014-02-03 NOTE — Telephone Encounter (Signed)
Patient calling to see if sleep study results are in  [phone]

## 2014-02-03 NOTE — Telephone Encounter (Signed)
Results are not available, this can take at least 1 week

## 2014-02-03 NOTE — Telephone Encounter (Signed)
Call placed to patient and patient made aware.  

## 2014-02-04 ENCOUNTER — Encounter (HOSPITAL_COMMUNITY): Payer: PRIVATE HEALTH INSURANCE

## 2014-02-06 ENCOUNTER — Encounter (HOSPITAL_COMMUNITY): Payer: PRIVATE HEALTH INSURANCE

## 2014-02-06 ENCOUNTER — Ambulatory Visit (INDEPENDENT_AMBULATORY_CARE_PROVIDER_SITE_OTHER): Payer: PRIVATE HEALTH INSURANCE

## 2014-02-06 DIAGNOSIS — I480 Paroxysmal atrial fibrillation: Secondary | ICD-10-CM

## 2014-02-06 DIAGNOSIS — I4891 Unspecified atrial fibrillation: Secondary | ICD-10-CM

## 2014-02-06 DIAGNOSIS — Z5181 Encounter for therapeutic drug level monitoring: Secondary | ICD-10-CM

## 2014-02-06 LAB — POCT INR: INR: 4.1

## 2014-02-09 ENCOUNTER — Encounter (HOSPITAL_COMMUNITY): Payer: PRIVATE HEALTH INSURANCE

## 2014-02-10 ENCOUNTER — Encounter: Payer: Self-pay | Admitting: Nurse Practitioner

## 2014-02-10 ENCOUNTER — Ambulatory Visit (HOSPITAL_COMMUNITY): Payer: PRIVATE HEALTH INSURANCE | Attending: Cardiology

## 2014-02-10 ENCOUNTER — Ambulatory Visit (INDEPENDENT_AMBULATORY_CARE_PROVIDER_SITE_OTHER): Payer: PRIVATE HEALTH INSURANCE | Admitting: Nurse Practitioner

## 2014-02-10 VITALS — BP 140/80 | HR 76 | Ht 72.0 in | Wt 289.8 lb

## 2014-02-10 DIAGNOSIS — Z955 Presence of coronary angioplasty implant and graft: Secondary | ICD-10-CM

## 2014-02-10 DIAGNOSIS — I3139 Other pericardial effusion (noninflammatory): Secondary | ICD-10-CM

## 2014-02-10 DIAGNOSIS — I313 Pericardial effusion (noninflammatory): Secondary | ICD-10-CM

## 2014-02-10 DIAGNOSIS — R0602 Shortness of breath: Secondary | ICD-10-CM

## 2014-02-10 DIAGNOSIS — I319 Disease of pericardium, unspecified: Secondary | ICD-10-CM

## 2014-02-10 DIAGNOSIS — R0989 Other specified symptoms and signs involving the circulatory and respiratory systems: Secondary | ICD-10-CM

## 2014-02-10 DIAGNOSIS — R0609 Other forms of dyspnea: Secondary | ICD-10-CM

## 2014-02-10 DIAGNOSIS — R5383 Other fatigue: Secondary | ICD-10-CM

## 2014-02-10 DIAGNOSIS — Z9861 Coronary angioplasty status: Secondary | ICD-10-CM

## 2014-02-10 DIAGNOSIS — I517 Cardiomegaly: Secondary | ICD-10-CM | POA: Diagnosis not present

## 2014-02-10 DIAGNOSIS — R0600 Dyspnea, unspecified: Secondary | ICD-10-CM

## 2014-02-10 DIAGNOSIS — R0789 Other chest pain: Secondary | ICD-10-CM

## 2014-02-10 DIAGNOSIS — Z79899 Other long term (current) drug therapy: Secondary | ICD-10-CM

## 2014-02-10 LAB — CBC
HCT: 39.2 % (ref 39.0–52.0)
Hemoglobin: 13.2 g/dL (ref 13.0–17.0)
MCHC: 33.7 g/dL (ref 30.0–36.0)
MCV: 90.8 fl (ref 78.0–100.0)
Platelets: 215 10*3/uL (ref 150.0–400.0)
RBC: 4.32 Mil/uL (ref 4.22–5.81)
RDW: 13.1 % (ref 11.5–15.5)
WBC: 7.1 10*3/uL (ref 4.0–10.5)

## 2014-02-10 LAB — BASIC METABOLIC PANEL
BUN: 11 mg/dL (ref 6–23)
CO2: 23 mEq/L (ref 19–32)
Calcium: 9 mg/dL (ref 8.4–10.5)
Chloride: 108 mEq/L (ref 96–112)
Creatinine, Ser: 0.9 mg/dL (ref 0.4–1.5)
GFR: 111.34 mL/min (ref >60.00)
Glucose, Bld: 99 mg/dL (ref 70–99)
Potassium: 3.4 mEq/L — ABNORMAL LOW (ref 3.5–5.1)
Sodium: 137 mEq/L (ref 135–145)

## 2014-02-10 LAB — BRAIN NATRIURETIC PEPTIDE: Pro B Natriuretic peptide (BNP): 463 pg/mL — ABNORMAL HIGH (ref 0.0–100.0)

## 2014-02-10 MED ORDER — ISOSORBIDE MONONITRATE ER 30 MG PO TB24: 30.0000 mg | ORAL_TABLET | Freq: Every day | ORAL | Status: DC

## 2014-02-10 NOTE — Addendum Note (Signed)
Addended by: Etter Sjogren on: 02/10/2014 12:47 PM   Modules accepted: Orders

## 2014-02-10 NOTE — Patient Instructions (Signed)
Stay on your current medicines but I am adding Imdur 30 mg a day   Use your NTG under your tongue for recurrent chest pain. May take one tablet every 5 minutes. If you are still having discomfort after 3 tablets in 15 minutes, call 911.  We will get a limited echo of your heart today  We need to check labs today  I will see you in a week  Call the [place] office at [phone] if you have any questions, problems or concerns.

## 2014-02-10 NOTE — Procedures (Signed)
Hurley A. Merlene Laughter, MD     [email or web address]        NAME:  Peter Dunlap, Peter Dunlap             ACCOUNT NO.:  [id number]  MEDICAL RECORD NO.:  [id number]          PATIENT TYPE:  OUT  LOCATION:  SLEEP LAB                     FACILITY:  [facility]  PHYSICIAN:  Kofi A. Merlene Laughter, M.D. DATE OF BIRTH:  03-01-1956  DATE OF STUDY:  01/30/2014                           NOCTURNAL POLYSOMNOGRAM  REFERRING PHYSICIAN:  [name], MD  INDICATION:  A 58 year old who presents with snoring, obesity, and hypersomnia.  MEDICATIONS:   Prior to Admission medications   Medication Sig Start Date End Date Taking? Authorizing Provider  aspirin EC 81 MG tablet Take 81 mg by mouth daily.    Historical Provider, MD  clopidogrel (PLAVIX) 75 MG tablet Take 1 tablet (75 mg total) by mouth daily with breakfast. 01/27/14   Tarri Fuller, PA-C  esomeprazole (NEXIUM) 40 MG capsule Take 1 capsule (40 mg total) by mouth daily. 01/05/14   [name], MD  isosorbide mononitrate (IMDUR) 30 MG 24 hr tablet Take 1 tablet (30 mg total) by mouth daily. 02/10/14   Burtis Junes, NP  metoprolol succinate (TOPROL-XL) 25 MG 24 hr tablet Take 1 tablet (25 mg total) by mouth daily. 12/05/13   Rhonda G Barrett, PA-C  nitroGLYCERIN (NITROSTAT) 0.4 MG SL tablet Place 1 tablet (0.4 mg total) under the tongue every 5 (five) minutes as needed for chest pain. 12/05/13   Rhonda G Barrett, PA-C  RAPAFLO 8 MG CAPS capsule Take 8 mg by mouth daily.  10/21/13   Historical Provider, MD  simvastatin (ZOCOR) 40 MG tablet Take 40 mg by mouth daily.     Historical Provider, MD  verapamil (CALAN-SR) 180 MG CR tablet TAKE 1 TABLET BY MOUTH EVERY NIGHT AT BEDTIME 01/27/14   [name], MD  warfarin (COUMADIN) 5 MG tablet Take as directed by Coumadin clinic 02/02/14   [name], MD     Epworth sleepiness scale 9.  BMI 53.  ARCHITECTURAL SUMMERY:  This is a split night recording with the initial portion being a diagnostic and  second portion a titration recording. The total recording time is 483 minutes, sleep efficiency 79%.  Sleep latency 30 minutes.  REM latency 58 minutes.  Stage N1 9%, N2 70%, N3 6%, and REM sleep 14%.  RESPIRATORY SUMMARY:  Baseline oxygen saturation is 97%, lowest saturation 81%.  Diagnostic AHI is 81.  The patient was placed on positive pressure starting at 5 and increased to 14, was subsequently switched to bilevel pressures starting at 15/10 and increased to 19/13. The patient seemed to develop central apneas on the higher pressures even on BiPAP pressures.  No clear optimal pressures, although he did a little better on the higher pressures.  LIMB MOVEMENT SUMMARY:  PLM index 0.  ELECTROCARDIOGRAM SUMMARY:  Average heart rate 61 with atrial fibrillation/flutter observed.  IMPRESSION: 1. Severe obstructive sleep apnea syndrome with marginal response to     positive pressures at higher levels.  We recommend the patient be     placed on AutoPap between 10 and 20. 2. Atrial fibrillation.  Thanks for  this referral.     Kofi A. Merlene Laughter, M.D.    KAD/MEDQ  D:  02/10/2014 08:59:07  T:  02/10/2014 09:15:25  Job:  [id number]

## 2014-02-10 NOTE — Progress Notes (Signed)
2D Echo completed. 02/10/2014

## 2014-02-10 NOTE — Progress Notes (Signed)
Peter Dunlap Date of Birth: [birth year] Medical Record #[id number]  History of Present Illness: Peter Dunlap is seen back today for a post hospital visit - seen for Dr. Lovena Le. He has had known moderate to severe 2 vessel CAD per cath back in 2012. Other issues include GERD, PSVT, HTN, HLD, HOCM, PAF, pericardial effusion, type 2 DM, obesity, OSA and chronic anticoagulation with coumadin.  Most recently presented to [facility] ER 12/02/13 with ~1 week of worsening exertional dyspnea with mild chest pressure. He was also noted to be in Afib. He ruled In for NSTEMI. He underwent cath/ PCI by Dr Ellyn Hack 12/03/13 with a Promus DES to the LAD. The pt had stable distal RCA-PDA disease of 60-70%. EF was hyperdynamic. Echo done 12/02/13 showed an EF of 70-75%. There was a moderate pericardial effusion noted but no evidence of tamponade. The pt had PAF during that hospitalization and it was decided not to resume Coumadin till later secondary to his pericardial effusion. He saw Dr Lovena Le 12/24/13. The pt was instructed to stop Plavix in 3 weeks and restart Coumadin. The pt says he stopped his Plavix 01/12/14 and started Coumadin 01/13/14.   After his PCI the pt says he felt better "for a few days" but continued to have DOE and chest discomfort since discharge and he was readmitted and in the ER noted to be in AF with VR around 100. CXR suggest vascular congestion, BNP 1316, Troponin 0.08. He was admitted and started on IV NTG. An echo was completed and revealed EF of 70%, severe LVH, small pericardial effusion. Coumadin was held for left heart cath. $RemoveBef'40mg'hwSqOkPjDF$  IV lasix was given With K+ in the ER. Plavix was restarted. Heparin was started when INR dropped below 2.0. He went for a left heart cath which revealed 80% proximal OM1 stenosis, successfully treated with a 2.5 x 20 Promus drug-eluting stent, postdilated to greater than 3 mm in diameter. Patent stents in the mid left anterior descending artery. Mild to  moderate diffuse disease in the LAD. Patent branches. Mild to moderate diffuse disease in the right coronary artery. ASA, plavix and coumadin for 4 weeks then DC ASA.   Comes back today. Here alone. He has been home about 2 weeks. Did ok the first week. Feels like he is back sliding now. More short of breath. Feels like he has to sit up night. Has had some chest pain with walking. had this with walking from the parking lot - hard to walk a fair distance. Now resolved. Says it is always very short lived.  Used NTG yesterday with relief. No headache. Weight is down. Tries to not use salt. Remains on Plavix, coumadin and aspirin. Some hemorrhoidal bleeding reported.   Current Outpatient Prescriptions  Medication Sig Dispense Refill  . aspirin EC 81 MG tablet Take 81 mg by mouth daily.      . clopidogrel (PLAVIX) 75 MG tablet Take 1 tablet (75 mg total) by mouth daily with breakfast.  30 tablet  5  . esomeprazole (NEXIUM) 40 MG capsule Take 1 capsule (40 mg total) by mouth daily.  30 capsule  3  . metoprolol succinate (TOPROL-XL) 25 MG 24 hr tablet Take 1 tablet (25 mg total) by mouth daily.  60 tablet  11  . nitroGLYCERIN (NITROSTAT) 0.4 MG SL tablet Place 1 tablet (0.4 mg total) under the tongue every 5 (five) minutes as needed for chest pain.  25 tablet  12  . RAPAFLO 8 MG CAPS capsule  Take 8 mg by mouth daily.       . simvastatin (ZOCOR) 40 MG tablet Take 40 mg by mouth daily.       . verapamil (CALAN-SR) 180 MG CR tablet TAKE 1 TABLET BY MOUTH EVERY NIGHT AT BEDTIME  30 tablet  0  . warfarin (COUMADIN) 5 MG tablet Take as directed by Coumadin clinic  30 tablet  2   No current facility-administered medications for this visit.    Allergies  Allergen Reactions  . Lactose Intolerance (Gi)     Past Medical History  Diagnosis Date  . Hypertension     Severe LVH with normal EF  . Arteriosclerotic cardiovascular disease (ASCVD) 2005    catheterization in 10/2010:50% mid LAD, diffuse distal  disease, circumflex irregularities, large dominant RCA with a 50% ostial, 70% distal, 60% posterolateral and 70% PDA; normal EF  . Cerebrovascular disease 2010    R. carotid endarterectomy; Duplex in 10/2010-widely patent ICAs, subtotal left vertebral-not thought to be contributing to symptoms  . Hyperlipidemia   . Obesity   . Tobacco abuse     Quit 2014  . Benign prostatic hypertrophy   . Low back pain   . History of PSVT (paroxysmal supraventricular tachycardia) 2005    Diagnosed with ILR; no recurrence following RFA  . Cervical spine disease     CT in 2012-advanced degeneration and spondylosis with moderate spinal stenosis at C3-C6  . H/O: substance abuse     Cocaine, marijuana, alcohol.  Quit 2013.   [name] Syncope   . Gastroesophageal reflux disease   . Depression   . Erectile dysfunction   . Coronary artery disease   . Myocardial infarction   . Anginal pain   . Shortness of breath   . CHF (congestive heart failure)   . H/O hiatal hernia   . Headache(784.0)   . Arthritis   . Carotid artery occlusion   . Pericardial effusion without cardiac tamponade 12/03/2013  . Diabetes mellitus type 2 in obese 12/03/2013  . Non-ST elevation myocardial infarction (NSTEMI), initial episode of care 12/02/2013    DES LAD    Past Surgical History  Procedure Laterality Date  . Radiofrequency ablation  2005    for PSVT  . Coronary angioplasty with stent placement  12/03/2013    LAD 90%-->0% W/ Promus Premier DES 3.0 mm x 16 mm, CFX OK, RCA 40%, EF 70-75%  . Carotid endarterectomy Right Feb. 25, 2010     CEA    History  Smoking status  . Former Smoker -- 1.00 packs/day for 40 years  . Types: Cigarettes  . Quit date: 10/10/2012  Smokeless tobacco  . Never Used    Comment: Quit in May.     History  Alcohol Use No    Family History  Problem Relation Age of Onset  . Hypertension Mother     Cerebrovascular disease  . Diabetes Mother   . Coronary artery disease Father 79  . Diabetes  type II Father   . Hypertension Father   . Heart attack Father   . Lung cancer Paternal Uncle   . Diabetes Sister   . Hypertension Sister   . Heart attack Sister 16  . Diabetes Brother   . Hypertension Brother     Review of Systems: The review of systems is per the HPI.  All other systems were reviewed and are negative.  Physical Exam: Pulse 81  Ht 6' (1.829 m)  Wt 289 lb 12.8 oz (131.452 kg)  BMI 39.30 kg/m2  SpO2 100% Patient is very pleasant and in no acute distress. He is obese.  Skin is warm and dry. Color is normal.  HEENT is unremarkable. Normocephalic/atraumatic. PERRL. Sclera are nonicteric. Neck is supple. No masses. No JVD. Lungs are clear. Cardiac exam shows a regular rate and rhythm. ?rub. Abdomen is soft. Extremities are without edema. Gait and ROM are intact. No gross neurologic deficits noted.  Wt Readings from Last 3 Encounters:  02/10/14 289 lb 12.8 oz (131.452 kg)  02/01/14 303 lb (137.44 kg)  01/27/14 297 lb 9.9 oz (135 kg)    LABORATORY DATA/PROCEDURES:  Lab Results  Component Value Date   WBC 6.7 01/27/2014   HGB 12.5* 01/27/2014   HCT 37.3* 01/27/2014   PLT 194 01/27/2014   GLUCOSE 104* 01/27/2014   CHOL 100 12/03/2013   TRIG 87 12/03/2013   HDL 29* 12/03/2013   LDLCALC 54 12/03/2013   ALT 13 03/08/2012   AST 20 03/08/2012   NA 138 01/27/2014   K 4.1 01/27/2014   CL 106 01/27/2014   CREATININE 0.93 01/27/2014   BUN 12 01/27/2014   CO2 21 01/27/2014   TSH 1.684 01/13/2013   INR 4.1 02/06/2014   HGBA1C 6.4* 12/02/2013   MICROALBUR 0.51 01/05/2014   Lab Results  Component Value Date   INR 4.1 02/06/2014   INR 3.1 01/30/2014   INR 1.41 01/27/2014    BNP (last 3 results)  Recent Labs  12/02/13 0039 12/07/13 0425 01/21/14 0932  PROBNP 955.4* 980.3* 1316.0*   Coronary Anatomy:  Dominance: Right Left Main: Normal caliber vessel with mild calcification, bifurcates into the Left Anterior Descending Artery (LAD) and Circumflex (Cx) LAD: normal caliber vessel with  a small diagonal branch and major septal perforator proximally. There is a short ~40% stenosis followed by a short "normal" segment just up to the First Diagonal (D1). Just beyond D1, there is an eccentric, focal 80-90% stenosis. Beyond this stenosis, the LAD gives of a moderate to large caliber mid Vessel D3 that is larger than the quite small caliber "Septal LAD".  D2: Small to moderate caliber vessel just proximal to the LAD lesion. Mild luminal Irregularities.  D3: Moderate caliber vessel that perfuses the mid to distal anterolateral wall. Minimal luminal irregularities.  Left Circumflex: Normal caliber vessel with diffuse mild disease, but no angiographically notable lesion. It bifurcates into OM1 and the AV Groove Cx that terminates as OM2 with a diminutive Left Posterolateral Branch (LPL).  OM1: Moderate caliber vessel; minimal luminal irregularities.  OM 2: Small to moderate caliber vessel that tapers into a diminutive branch distallyl; minimal luminal irregularities RCA: Large caliber, dominant vessel with a focal 40-50% proximal lesion. The Mid vessel is angiographically normal.  The distal RCA bifurcates into the Right Posterior Descending Artery (RPDA) and the Right Posterior AV Groove Branch (RPAV). There is a stable ~60-70% lesion in essentially a 1,1,1 configuration involving the "ostial" portion of each branch. Each branch has minimal disease beyond the ostial lesions. There is a smaller secondary RPDA with focal ~70% ostial stenosis from the main RPDA.  After reviewing the initial angiography, the culprit lesion was thought to be 80-90% mid LAD lesion at D2. Preparation were made to proceed with PCI on this lesion.  Percutaneous Coronary Intervention: Sheath exchanged for 6 Fr  Guide: 6 Fr XBLAD Guidewire: ProWater  Predilation Balloon: Emerge 2.0 mm x 12 mm;  10 Atm x 20 Sec, Stent: Promus Premier DES 3.0 mm x 16 mm;  Max Inflation: 16 Atm x 30 Sec, Post-dilation Balloon: [place] Emerge  3.25 mm x 12 mm;  14 Atm x 30 Sec, 16 Atm x 45 Sec (mid & proximal stent)  Final Diameter: 3.3 mm Post deployment angiography in multiple views, with and without guidewire in place revealed excellent stent deployment and lesion coverage. There was no evidence of dissection or perforation.  MEDICATIONS:  Anesthesia: Local Lidocaine 2 ml Sedation: 1 mg IV Versed, 50 mcg IV fentanyl ;  Omnipaque Contrast: 160 ml  Anticoagulation: IV Heparin 5000 + 2000 (x2) Units -- Total 9000 Units; ACT > 250 Sec  Anti-Platelet Agent: Brilinta 180 mg Radial Cocktail: 5 mg Verapamil, 400 mcg NTG, 2 ml 2% Lidocaine in 10 ml NS PATIENT DISPOSITION:  The patient was transferred to the PACU holding area in a hemodynamicaly stable, chest pain free condition.  The patient tolerated the procedure well, and there were no complications. EBL: < 10 ml  The patient was stable before, during, and after the procedure. POST-OPERATIVE DIAGNOSIS:  Severe mid LAD ~80-90% stenosis just after D1 and large SP trunk; distal LAD gives off major midLAD D2 that is larger that the distal LAD. Distal LAD reaches the apex. D2 covers entire anterolateral wall.  Successful PCI of mid LAD with Promus DES Stable distal RCA-PDA-PL bifurcation ~60-70% stenosis and diffuse mild disease in Cx-OM1-OM2  Hyperdynamic LVEF with mild to moderately elevated LVEDP. PLAN OF CARE:  Transfer to [facility] for standard Post Radial Cath PCI care. DAPT x minimum of 1 year (has been on Plavix chronically - Check p2Y 12 at f/u visit)  Continue current regimen.  Anticipate d/c in AM. Leonie Man, M.D., M.S.  Interventional Cardiologist     PROCEDURE: Left heart catheterization with selective coronary angiography, left ventriculogram. PCI OM1  INDICATIONS: Unstable angina  The risks, benefits, and details of the procedure were explained to the patient. The patient verbalized understanding and wanted to proceed. Informed written consent  was obtained.  PROCEDURE TECHNIQUE: After Xylocaine anesthesia a 35F slender sheath was placed in the right radial artery with a single anterior needle wall stick. IV heparin was given. Right coronary angiography was done using a Judkins R4 guide catheter. Left coronary angiography was done using a Judkins L3.5 guide catheter. Left ventriculography was done using a pigtail catheter. The intervention was performed. Please see below for details. A TR band was used for hemostasis.  CONTRAST: Total of 220 cc.  COMPLICATIONS: None.  HEMODYNAMICS: Aortic pressure was 139/68; LV pressure was 139/14; LVEDP 23. There was no gradient between the left ventricle and aorta.  ANGIOGRAPHIC DATA: The left main coronary artery is widely patent.  The left anterior descending artery is a large vessel which reaches the apex. In the proximal portion, there is moderate, diffuse disease, up to 40%. In the mid vessel, the stent is widely patent the diagonal at the origin of the stent is also widely patent. There is a large diagonal off of the mid LAD past the diagonal which is widely patent. The apical LAD is small after the large diagonal vessel. There does not appear to be any significant disease in the LAD system..  The left circumflex artery is a medium to large size vessel. There is mild disease proximally. There is a large first obtuse marginal vessel which has an 80% eccentric stenosis proximally. The lesion looked significantly worse and cranial views. The more distal vessel is quite large. The remainder of the circumflex is widely patent  with some mild diffuse disease.  The right coronary artery is a large dominant vessel. There is mild disease in the proximal to mid vessel. At the bifurcation, there is moderate disease before the posterior lateral artery and posterior descending artery. The posterior descending artery is large and patent. The posterior lateral artery is medium size and widely patent. The appearance of the  disease at the bifurcation appears improved compared to the prior study in July.  LEFT VENTRICULOGRAM: Left ventricular angiogram was done in the 30 RAO projection and revealed normal left ventricular wall motion and hyperdynamic systolic function with an estimated ejection fraction of 80 %. LVEDP was 23 mmHg.  PCI NARRATIVE: Additional IV heparin was given. ACT was used to check that the heparin was therapeutic. We initially tried a CLS 3.0 guiding catheter but this would not reach the left main. We then tried a CLS 3.5 guiding catheter and with multiple manipulations, were able to engage the left main. A pro-water wire was placed across the area disease in the proximal OM1. A 2.5 x 15 balloon was used to predilate the lesion. A 2.5 x 20 promus drug eluting stent was then used to treat the area of disease. The stent was post dilated with a 3.0 x 12 noncompliant balloon with several inflations. There was an excellent angiographic result. Intracoronary nitroglycerin was administered. The patient tolerated the procedure well.  IMPRESSIONS:  1. Patent left main coronary artery. 2. Patent stents in the mid left anterior descending artery. Mild to moderate diffuse disease in the LAD. Patent branches. 3. Medium to large sized left circumflex artery and its branches. 80% proximal OM1 stenosis, successfully treated with a 2.5 x 20 Promus drug-eluting stent, postdilated to greater than 3 mm in diameter. 4. Mild to moderate diffuse disease in the right coronary artery. 5. Hyperdynamic left ventricular systolic function. LVEDP 23 mmHg. Ejection fraction 80 %. RECOMMENDATION: The anticoagulation regimen will be complicated in this patient due to his atrial fibrillation. Coumadin has been held in anticipation of cardiac cath. Long term, he will benefit from Coumadin. I agree with restarting Coumadin tonight. We'll also restart IV heparin 4 hours after the sheath pull. For now, he will be on aspirin and Plavix. At  discharge, his aspirin can be stopped. His heparin will obviously be stopped at discharge. He should go home on Plavix and Coumadin. The Plavix should continue for a minimum of 6 months.  He will be watched overnight. Unclear whether he needs to stay in the hospital to achieve therapeutic anticoagulation or if the INR can be trending up at the time of discharge with close followup.    Echo Study Conclusions from 01/23/14  - Left ventricle: Systolic anterior motion of the mitral valve. LVOT gradient difficult to assess. Technically limited study. The cavity size was mildly reduced. Wall thickness was increased in a pattern of severe LVH. There was concentric hypertrophy. The estimated ejection fraction was 70%. Consistent with HOCM. - Mitral valve: SAM of the mitral valve. - Left atrium: The atrium was severely dilated. - Pericardium, extracardiac: A small pericardial effusion was identified posterior to the heart.  Echo Study Conclusions from June 2015  - Left ventricle: The cavity size was mildly reduced. Wall thickness was increased in a pattern of severe LVH. There is mitral chordal systolic anterior motion. 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 Systolic function was vigorous. The estimated ejection fraction was in the range of 70% to 75%. There was dynamic obstruction at restin the outflow  tract, with a peak velocity of 365 cm/sec and a peak gradient of 53 mm Hg. Wall motion was normal; there were no regional wall motion abnormalities. Doppler parameters are consistent with abnormal left ventricular relaxation (grade 1 diastolic dysfunction). Doppler parameters are consistent with elevated ventricular end-diastolic filling pressure. - Aortic valve: Mildly to moderately calcified annulus. Trileaflet; mildly calcified leaflets. Cusp separation was mildly reduced. There was no significant regurgitation. - Mitral valve: Mildly thickened leaflets .  There was trivial regurgitation. - Left atrium: The atrium was mildly dilated. - Tricuspid valve: There was trivial regurgitation. - Pulmonary arteries: Systolic pressure could not be accurately estimated. - Inferior vena cava: Not visualized. Unable to estimate CVP. - Pericardium, extracardiac: A moderate pericardial effusion was identified circumferential to the heart.  Impressions:  - Reduced LV chamber size with severe LVH consistent with hypertensive or hypertrophic cardiomyopathy. There is mitral chordal SAM with increased LVOT gradient as noted, LVEF 70-75% and grade 1 diastolic dysfunction with increased filling pressures. Mild left atrial enlargement. Sclerotic aortic valve without stenosis. Trivial tricuspid regurgitation, unable to assess PASP. Moderate sized, circumferential pericardial effusion without clear evidence of tamponade physiology.    Assessment / Plan: 1. Chest pain - recent DES stent placement to LAD and OM1. Has residual disease. EKG nonacute today. Reviewed with Dr. Lovena Le. Will check limited echo to make sure his effusion has not returned. Add long acting nitrates and check labs today.   2. Pericardial effusion - will update his echo  3. HTN  See back in a week unless the echo is abnormal. Adding Imdur. Checking labs today as well. Dr. Lovena Le will speak to Dr. Irish Lack about assuming his general cardiology care.   Patient is agreeable to this plan and will call if any problems develop in the interim.   Burtis Junes, RN, [place] [address] [place], [place]  [phone]

## 2014-02-11 ENCOUNTER — Encounter (HOSPITAL_COMMUNITY): Payer: PRIVATE HEALTH INSURANCE

## 2014-02-11 ENCOUNTER — Other Ambulatory Visit: Payer: Self-pay | Admitting: *Deleted

## 2014-02-11 MED ORDER — FUROSEMIDE 20 MG PO TABS: 20.0000 mg | ORAL_TABLET | Freq: Every day | ORAL | Status: DC

## 2014-02-11 MED ORDER — POTASSIUM CHLORIDE CRYS ER 20 MEQ PO TBCR: 20.0000 meq | EXTENDED_RELEASE_TABLET | Freq: Every day | ORAL | Status: DC

## 2014-02-13 ENCOUNTER — Encounter (HOSPITAL_COMMUNITY): Payer: PRIVATE HEALTH INSURANCE

## 2014-02-13 ENCOUNTER — Ambulatory Visit (INDEPENDENT_AMBULATORY_CARE_PROVIDER_SITE_OTHER): Payer: PRIVATE HEALTH INSURANCE

## 2014-02-13 DIAGNOSIS — Z5181 Encounter for therapeutic drug level monitoring: Secondary | ICD-10-CM

## 2014-02-13 DIAGNOSIS — I480 Paroxysmal atrial fibrillation: Secondary | ICD-10-CM

## 2014-02-13 DIAGNOSIS — I4891 Unspecified atrial fibrillation: Secondary | ICD-10-CM

## 2014-02-13 LAB — POCT INR: INR: 3.8

## 2014-02-16 ENCOUNTER — Ambulatory Visit (INDEPENDENT_AMBULATORY_CARE_PROVIDER_SITE_OTHER): Payer: PRIVATE HEALTH INSURANCE | Admitting: Nurse Practitioner

## 2014-02-16 ENCOUNTER — Encounter (HOSPITAL_COMMUNITY): Payer: PRIVATE HEALTH INSURANCE

## 2014-02-16 ENCOUNTER — Encounter: Payer: Self-pay | Admitting: Nurse Practitioner

## 2014-02-16 VITALS — BP 150/70 | HR 86 | Ht 72.0 in | Wt 280.8 lb

## 2014-02-16 DIAGNOSIS — R634 Abnormal weight loss: Secondary | ICD-10-CM

## 2014-02-16 DIAGNOSIS — R0989 Other specified symptoms and signs involving the circulatory and respiratory systems: Secondary | ICD-10-CM

## 2014-02-16 DIAGNOSIS — I209 Angina pectoris, unspecified: Secondary | ICD-10-CM

## 2014-02-16 DIAGNOSIS — R079 Chest pain, unspecified: Secondary | ICD-10-CM

## 2014-02-16 DIAGNOSIS — R0600 Dyspnea, unspecified: Secondary | ICD-10-CM

## 2014-02-16 DIAGNOSIS — M10071 Idiopathic gout, right ankle and foot: Secondary | ICD-10-CM

## 2014-02-16 DIAGNOSIS — M109 Gout, unspecified: Secondary | ICD-10-CM

## 2014-02-16 DIAGNOSIS — R0609 Other forms of dyspnea: Secondary | ICD-10-CM

## 2014-02-16 LAB — CBC
HCT: 41.3 % (ref 39.0–52.0)
Hemoglobin: 13.7 g/dL (ref 13.0–17.0)
MCHC: 33.1 g/dL (ref 30.0–36.0)
MCV: 91.2 fl (ref 78.0–100.0)
Platelets: 206 10*3/uL (ref 150.0–400.0)
RBC: 4.53 Mil/uL (ref 4.22–5.81)
RDW: 13.5 % (ref 11.5–15.5)
WBC: 8 10*3/uL (ref 4.0–10.5)

## 2014-02-16 LAB — BASIC METABOLIC PANEL
BUN: 11 mg/dL (ref 6–23)
CO2: 23 mEq/L (ref 19–32)
Calcium: 9.4 mg/dL (ref 8.4–10.5)
Chloride: 104 mEq/L (ref 96–112)
Creatinine, Ser: 0.9 mg/dL (ref 0.4–1.5)
GFR: 112.78 mL/min (ref >60.00)
Glucose, Bld: 107 mg/dL — ABNORMAL HIGH (ref 70–99)
Potassium: 4 mEq/L (ref 3.5–5.1)
Sodium: 137 mEq/L (ref 135–145)

## 2014-02-16 LAB — BRAIN NATRIURETIC PEPTIDE: Pro B Natriuretic peptide (BNP): 448 pg/mL — ABNORMAL HIGH (ref 0.0–100.0)

## 2014-02-16 LAB — TSH: TSH: 0.04 u[IU]/mL — ABNORMAL LOW (ref 0.35–4.50)

## 2014-02-16 MED ORDER — METOPROLOL SUCCINATE ER 50 MG PO TB24: 50.0000 mg | ORAL_TABLET | Freq: Every day | ORAL | Status: DC

## 2014-02-16 MED ORDER — COLCHICINE 0.6 MG PO TABS: 0.6000 mg | ORAL_TABLET | Freq: Two times a day (BID) | ORAL | Status: DC | PRN

## 2014-02-16 MED ORDER — ISOSORBIDE MONONITRATE ER 60 MG PO TB24: 60.0000 mg | ORAL_TABLET | Freq: Every day | ORAL | Status: DC

## 2014-02-16 NOTE — Patient Instructions (Addendum)
We will recheck lab today  I am increasing your Imdur (Isosorbide) to 60 mg a day  I am increasing your Metoprolol to 50 mg a day  I have sent in a RX for gout - colchicine 0.6 mg to take twice a day just if needed  Keep your September 30th visit with Dr. Lovena Le - may need to discuss possible repeat cath at that time if you are not improved.  Call the [place] office at [phone] if you have any questions, problems or concerns.

## 2014-02-16 NOTE — Progress Notes (Signed)
Peter Dunlap Date of Birth: 1956/04/23 Medical Record #[id number]  History of Present Illness: [name] is seen back today for a post hospital visit - seen for Dr. Lovena Le. He has had known moderate to severe 2 vessel CAD per cath back in 2012. Other issues include GERD, PSVT, HTN, HLD, HOCM, PAF, pericardial effusion, type 2 DM, obesity, OSA and chronic anticoagulation with coumadin.   Most recently presented to [facility] ER 12/02/13 with ~1 week of worsening exertional dyspnea with mild chest pressure. He was also noted to be in Afib. He ruled In for NSTEMI. He underwent cath/ PCI by Dr Ellyn Hack 12/03/13 with a Promus DES to the LAD. The pt had stable distal RCA-PDA disease of 60-70%. EF was hyperdynamic. Echo done 12/02/13 showed an EF of 70-75%. There was a moderate pericardial effusion noted but no evidence of tamponade. The pt had PAF during that hospitalization and it was decided not to resume Coumadin till later secondary to his pericardial effusion. He saw Dr Lovena Le 12/24/13. The pt was instructed to stop Plavix in 3 weeks and restart Coumadin. The pt says he stopped his Plavix 01/12/14 and started Coumadin 01/13/14.   After his PCI the pt says he felt better "for a few days" but continued to have DOE and chest discomfort since discharge and he was readmitted and in the ER noted to be in AF with VR around 100. CXR suggest vascular congestion, BNP 1316, Troponin 0.08. He was admitted and started on IV NTG. An echo was completed and revealed EF of 70%, severe LVH, small pericardial effusion. Coumadin was held for left heart cath. 70m IV lasix was given With K+ in the ER. Plavix was restarted. Heparin was started when INR dropped below 2.0. He went for a left heart cath which revealed 80% proximal OM1 stenosis, successfully treated with a 2.5 x 20 Promus drug-eluting stent, postdilated to greater than 3 mm in diameter. Patent stents in the mid left anterior descending artery. Mild to  moderate diffuse disease in the LAD. Patent branches. Mild to moderate diffuse disease in the right coronary artery. ASA, plavix and coumadin for 4 weeks then DC ASA.   I saw him a week ago - he was having chest pain and was short of breath - got a limited echo - no effusion. Added Imdur.   Comes back today for follow up. Here alone. Lots of issues once again. Hard to walk - his right big toe is hurting - says he can hardly touch it - sounds like gout. Still with chest pain with walking - it has improved with the addition of nitrates but not resolved. Still with short of breath. Losing weight - says his food does not taste right. Weight 303 about 6 weeks ago. No bleeding. Does bruise. Remains on triple therapy with aspirin, plavix and coumadin. No swelling.   Current Outpatient Prescriptions  Medication Sig Dispense Refill  . aspirin EC 81 MG tablet Take 81 mg by mouth daily.      . clopidogrel (PLAVIX) 75 MG tablet Take 1 tablet (75 mg total) by mouth daily with breakfast.  30 tablet  5  . esomeprazole (NEXIUM) 40 MG capsule Take 1 capsule (40 mg total) by mouth daily.  30 capsule  3  . furosemide (LASIX) 20 MG tablet Take 1 tablet (20 mg total) by mouth daily.  30 tablet  3  . isosorbide mononitrate (IMDUR) 30 MG 24 hr tablet Take 1 tablet (30 mg total)  by mouth daily.  30 tablet  6  . metoprolol succinate (TOPROL-XL) 25 MG 24 hr tablet Take 1 tablet (25 mg total) by mouth daily.  60 tablet  11  . nitroGLYCERIN (NITROSTAT) 0.4 MG SL tablet Place 1 tablet (0.4 mg total) under the tongue every 5 (five) minutes as needed for chest pain.  25 tablet  12  . potassium chloride SA (K-DUR,KLOR-CON) 20 MEQ tablet Take 1 tablet (20 mEq total) by mouth daily.  30 tablet  3  . RAPAFLO 8 MG CAPS capsule Take 8 mg by mouth daily.       . simvastatin (ZOCOR) 40 MG tablet Take 40 mg by mouth daily.       . verapamil (CALAN-SR) 180 MG CR tablet TAKE 1 TABLET BY MOUTH EVERY NIGHT AT BEDTIME  30 tablet  0  .  warfarin (COUMADIN) 5 MG tablet Take as directed by Coumadin clinic  30 tablet  2   No current facility-administered medications for this visit.    Allergies  Allergen Reactions  . Lactose Intolerance (Gi)     Past Medical History  Diagnosis Date  . Hypertension     Severe LVH with normal EF  . Arteriosclerotic cardiovascular disease (ASCVD) 2005    catheterization in 10/2010:50% mid LAD, diffuse distal disease, circumflex irregularities, large dominant RCA with a 50% ostial, 70% distal, 60% posterolateral and 70% PDA; normal EF  . Cerebrovascular disease 2010    R. carotid endarterectomy; Duplex in 10/2010-widely patent ICAs, subtotal left vertebral-not thought to be contributing to symptoms  . Hyperlipidemia   . Obesity   . Tobacco abuse     Quit 2014  . Benign prostatic hypertrophy   . Low back pain   . History of PSVT (paroxysmal supraventricular tachycardia) 2005    Diagnosed with ILR; no recurrence following RFA  . Cervical spine disease     CT in 2012-advanced degeneration and spondylosis with moderate spinal stenosis at C3-C6  . H/O: substance abuse     Cocaine, marijuana, alcohol.  Quit 2013.   [name] Syncope   . Gastroesophageal reflux disease   . Depression   . Erectile dysfunction   . Coronary artery disease   . Myocardial infarction   . Anginal pain   . Shortness of breath   . CHF (congestive heart failure)   . H/O hiatal hernia   . Headache(784.0)   . Arthritis   . Carotid artery occlusion   . Pericardial effusion without cardiac tamponade 12/03/2013  . Diabetes mellitus type 2 in obese 12/03/2013  . Non-ST elevation myocardial infarction (NSTEMI), initial episode of care 12/02/2013    DES LAD    Past Surgical History  Procedure Laterality Date  . Radiofrequency ablation  2005    for PSVT  . Coronary angioplasty with stent placement  12/03/2013    LAD 90%-->0% W/ Promus Premier DES 3.0 mm x 16 mm, CFX OK, RCA 40%, EF 70-75%  . Carotid endarterectomy Right  Feb. 25, 2010     CEA    History  Smoking status  . Former Smoker -- 1.00 packs/day for 40 years  . Types: Cigarettes  . Quit date: 10/10/2012  Smokeless tobacco  . Never Used    Comment: Quit in May.     History  Alcohol Use No    Family History  Problem Relation Age of Onset  . Hypertension Mother     Cerebrovascular disease  . Diabetes Mother   . Coronary artery disease Father  44  . Diabetes type II Father   . Hypertension Father   . Heart attack Father   . Lung cancer Paternal Uncle   . Diabetes Sister   . Hypertension Sister   . Heart attack Sister 76  . Diabetes Brother   . Hypertension Brother     Review of Systems: The review of systems is per the HPI.  All other systems were reviewed and are negative.  Physical Exam: BP 150/70  Pulse 86  Ht 6' (1.829 m)  Wt 280 lb 12.8 oz (127.37 kg)  BMI 38.07 kg/m2  SpO2 99% Patient is very pleasant and in no acute distress. His weight is down 9 pounds but remains obese. Skin is warm and dry. Color is normal.  HEENT is unremarkable. Normocephalic/atraumatic. PERRL. Sclera are nonicteric. Neck is supple. No masses. No JVD. Lungs are clear. Cardiac exam shows an irregular rate and rhythm. Rate about 100 my exam exam. Abdomen is soft. Extremities are without edema. Gait and ROM are intact. No gross neurologic deficits noted. Right great toe is swollen and tender to touch.  Wt Readings from Last 3 Encounters:  02/16/14 280 lb 12.8 oz (127.37 kg)  02/10/14 289 lb 12.8 oz (131.452 kg)  02/01/14 303 lb (137.44 kg)    LABORATORY DATA/PROCEDURES: EKG today with AF with a rate of 91. Has LVH  Lab Results  Component Value Date   WBC 7.1 02/10/2014   HGB 13.2 02/10/2014   HCT 39.2 02/10/2014   PLT 215.0 02/10/2014   GLUCOSE 99 02/10/2014   CHOL 100 12/03/2013   TRIG 87 12/03/2013   HDL 29* 12/03/2013   LDLCALC 54 12/03/2013   ALT 13 03/08/2012   AST 20 03/08/2012   NA 137 02/10/2014   K 3.4* 02/10/2014   CL 108 02/10/2014   CREATININE 0.9  02/10/2014   BUN 11 02/10/2014   CO2 23 02/10/2014   TSH 1.684 01/13/2013   INR 3.8 02/13/2014   HGBA1C 6.4* 12/02/2013   MICROALBUR 0.51 01/05/2014    BNP (last 3 results)  Recent Labs  12/07/13 0425 01/21/14 0932 02/10/14 1231  PROBNP 980.3* 1316.0* 463.0*   Coronary Anatomy:  Dominance: Right  Left Main: Normal caliber vessel with mild calcification, bifurcates into the Left Anterior Descending Artery (LAD) and Circumflex (Cx) LAD: normal caliber vessel with a small diagonal branch and major septal perforator proximally. There is a short ~40% stenosis followed by a short "normal" segment just up to the First Diagonal (D1). Just beyond D1, there is an eccentric, focal 80-90% stenosis. Beyond this stenosis, the LAD gives of a moderate to large caliber mid Vessel D3 that is larger than the quite small caliber "Septal LAD".  D2: Small to moderate caliber vessel just proximal to the LAD lesion. Mild luminal Irregularities.  D3: Moderate caliber vessel that perfuses the mid to distal anterolateral wall. Minimal luminal irregularities.  Left Circumflex: Normal caliber vessel with diffuse mild disease, but no angiographically notable lesion. It bifurcates into OM1 and the AV Groove Cx that terminates as OM2 with a diminutive Left Posterolateral Branch (LPL).  OM1: Moderate caliber vessel; minimal luminal irregularities.  OM 2: Small to moderate caliber vessel that tapers into a diminutive branch distallyl; minimal luminal irregularities  RCA: Large caliber, dominant vessel with a focal 40-50% proximal lesion. The Mid vessel is angiographically normal.  The distal RCA bifurcates into the Right Posterior Descending Artery (RPDA) and the Right Posterior AV Groove Branch (RPAV). There is a stable ~60-70% lesion  in essentially a 1,1,1 configuration involving the "ostial" portion of each branch. Each branch has minimal disease beyond the ostial lesions. There is a smaller secondary RPDA with focal ~70% ostial  stenosis from the main RPDA.  After reviewing the initial angiography, the culprit lesion was thought to be 80-90% mid LAD lesion at D2. Preparation were made to proceed with PCI on this lesion.    Percutaneous Coronary Intervention: Sheath exchanged for 6 Fr  Guide: 6 Fr XBLAD Guidewire: ProWater  Predilation Balloon: Emerge 2.0 mm x 12 mm;  10 Atm x 20 Sec,  Stent: Promus Premier DES 3.0 mm x 16 mm;  Max Inflation: 16 Atm x 30 Sec,  Post-dilation Balloon: Bearden Emerge 3.25 mm x 12 mm;  14 Atm x 30 Sec, 16 Atm x 45 Sec (mid & proximal stent)  Final Diameter: 3.3 mm  Post deployment angiography in multiple views, with and without guidewire in place revealed excellent stent deployment and lesion coverage. There was no evidence of dissection or perforation.  MEDICATIONS:  Anesthesia: Local Lidocaine 2 ml  Sedation: 1 mg IV Versed, 50 mcg IV fentanyl ;  Omnipaque Contrast: 160 ml  Anticoagulation: IV Heparin 5000 + 2000 (x2) Units -- Total 9000 Units; ACT > 250 Sec  Anti-Platelet Agent: Brilinta 180 mg  Radial Cocktail: 5 mg Verapamil, 400 mcg NTG, 2 ml 2% Lidocaine in 10 ml NS PATIENT DISPOSITION:  The patient was transferred to the PACU holding area in a hemodynamicaly stable, chest pain free condition.  The patient tolerated the procedure well, and there were no complications. EBL: < 10 ml  The patient was stable before, during, and after the procedure. POST-OPERATIVE DIAGNOSIS:  Severe mid LAD ~80-90% stenosis just after D1 and large SP trunk; distal LAD gives off major midLAD D2 that is larger that the distal LAD. Distal LAD reaches the apex. D2 covers entire anterolateral wall.  Successful PCI of mid LAD with Promus DES  Stable distal RCA-PDA-PL bifurcation ~60-70% stenosis and diffuse mild disease in Cx-OM1-OM2  Hyperdynamic LVEF with mild to moderately elevated LVEDP. PLAN OF CARE:  Transfer to [facility] for standard Post Radial Cath PCI care.  DAPT x minimum of 1  year (has been on Plavix chronically - Check p2Y 12 at f/u visit)  Continue current regimen.  Anticipate d/c in AM. Leonie Man, M.D., M.S.  Interventional Cardiologist    PROCEDURE: Left heart catheterization with selective coronary angiography, left ventriculogram. PCI OM1  INDICATIONS: Unstable angina  The risks, benefits, and details of the procedure were explained to the patient. The patient verbalized understanding and wanted to proceed. Informed written consent was obtained.  PROCEDURE TECHNIQUE: After Xylocaine anesthesia a 66F slender sheath was placed in the right radial artery with a single anterior needle wall stick. IV heparin was given. Right coronary angiography was done using a Judkins R4 guide catheter. Left coronary angiography was done using a Judkins L3.5 guide catheter. Left ventriculography was done using a pigtail catheter. The intervention was performed. Please see below for details. A TR band was used for hemostasis.  CONTRAST: Total of 220 cc.  COMPLICATIONS: None.  HEMODYNAMICS: Aortic pressure was 139/68; LV pressure was 139/14; LVEDP 23. There was no gradient between the left ventricle and aorta.  ANGIOGRAPHIC DATA: The left main coronary artery is widely patent.  The left anterior descending artery is a large vessel which reaches the apex. In the proximal portion, there is moderate, diffuse disease, up to 40%. In the  mid vessel, the stent is widely patent the diagonal at the origin of the stent is also widely patent. There is a large diagonal off of the mid LAD past the diagonal which is widely patent. The apical LAD is small after the large diagonal vessel. There does not appear to be any significant disease in the LAD system..  The left circumflex artery is a medium to large size vessel. There is mild disease proximally. There is a large first obtuse marginal vessel which has an 80% eccentric stenosis proximally. The lesion looked significantly worse and cranial  views. The more distal vessel is quite large. The remainder of the circumflex is widely patent with some mild diffuse disease.  The right coronary artery is a large dominant vessel. There is mild disease in the proximal to mid vessel. At the bifurcation, there is moderate disease before the posterior lateral artery and posterior descending artery. The posterior descending artery is large and patent. The posterior lateral artery is medium size and widely patent. The appearance of the disease at the bifurcation appears improved compared to the prior study in July.  LEFT VENTRICULOGRAM: Left ventricular angiogram was done in the 30 RAO projection and revealed normal left ventricular wall motion and hyperdynamic systolic function with an estimated ejection fraction of 80 %. LVEDP was 23 mmHg.  PCI NARRATIVE: Additional IV heparin was given. ACT was used to check that the heparin was therapeutic. We initially tried a CLS 3.0 guiding catheter but this would not reach the left main. We then tried a CLS 3.5 guiding catheter and with multiple manipulations, were able to engage the left main. A pro-water wire was placed across the area disease in the proximal OM1. A 2.5 x 15 balloon was used to predilate the lesion. A 2.5 x 20 promus drug eluting stent was then used to treat the area of disease. The stent was post dilated with a 3.0 x 12 noncompliant balloon with several inflations. There was an excellent angiographic result. Intracoronary nitroglycerin was administered. The patient tolerated the procedure well.  IMPRESSIONS:  1. Patent left main coronary artery. 2. Patent stents in the mid left anterior descending artery. Mild to moderate diffuse disease in the LAD. Patent branches. 3. Medium to large sized left circumflex artery and its branches. 80% proximal OM1 stenosis, successfully treated with a 2.5 x 20 Promus drug-eluting stent, postdilated to greater than 3 mm in diameter. 4. Mild to moderate diffuse  disease in the right coronary artery. 5. Hyperdynamic left ventricular systolic function. LVEDP 23 mmHg. Ejection fraction 80 %. RECOMMENDATION: The anticoagulation regimen will be complicated in this patient due to his atrial fibrillation. Coumadin has been held in anticipation of cardiac cath. Long term, he will benefit from Coumadin. I agree with restarting Coumadin tonight. We'll also restart IV heparin 4 hours after the sheath pull. For now, he will be on aspirin and Plavix. At discharge, his aspirin can be stopped. His heparin will obviously be stopped at discharge. He should go home on Plavix and Coumadin. The Plavix should continue for a minimum of 6 months.  He will be watched overnight. Unclear whether he needs to stay in the hospital to achieve therapeutic anticoagulation or if the INR can be trending up at the time of discharge with close followup.   Echo Study Conclusions from September 2015  - Left ventricle: Systolic anterior motion of the mitral valve. LVOT gradient difficult to assess. Technically limited study. The cavity size was mildly reduced. Wall thickness was  increased in a pattern of severe LVH. There was concentric hypertrophy. The estimated ejection fraction was 70%. Consistent with HOCM. - Mitral valve: SAM of the mitral valve. - Left atrium: The atrium was severely dilated. - Pericardium, extracardiac: A small pericardial effusion was identified posterior to the heart.   Echo Study Conclusions from 01/23/14  - Left ventricle: Systolic anterior motion of the mitral valve. LVOT gradient difficult to assess. Technically limited study. The cavity size was mildly reduced. Wall thickness was increased in a pattern of severe LVH. There was concentric hypertrophy. The estimated ejection fraction was 70%. Consistent with HOCM. - Mitral valve: SAM of the mitral valve. - Left atrium: The atrium was severely dilated. - Pericardium, extracardiac: A small pericardial effusion  was identified posterior to the heart.  Echo Study Conclusions from June 2015  - Left ventricle: The cavity size was mildly reduced. Wall thickness was increased in a pattern of severe LVH. There is mitral chordal systolic anterior motion. 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 Systolic function was vigorous. The estimated ejection fraction was in the range of 70% to 75%. There was dynamic obstruction at restin the outflow tract, with a peak velocity of 365 cm/sec and a peak gradient of 53 mm Hg. Wall motion was normal; there were no regional wall motion abnormalities. Doppler parameters are consistent with abnormal left ventricular relaxation (grade 1 diastolic dysfunction). Doppler parameters are consistent with elevated ventricular end-diastolic filling pressure. - Aortic valve: Mildly to moderately calcified annulus. Trileaflet; mildly calcified leaflets. Cusp separation was mildly reduced. There was no significant regurgitation. - Mitral valve: Mildly thickened leaflets . There was trivial regurgitation. - Left atrium: The atrium was mildly dilated. - Tricuspid valve: There was trivial regurgitation. - Pulmonary arteries: Systolic pressure could not be accurately estimated. - Inferior vena cava: Not visualized. Unable to estimate CVP. - Pericardium, extracardiac: A moderate pericardial effusion was identified circumferential to the heart.  Impressions:  - Reduced LV chamber size with severe LVH consistent with hypertensive or hypertrophic cardiomyopathy. There is mitral chordal SAM with increased LVOT gradient as noted, LVEF 70-75% and grade 1 diastolic dysfunction with increased filling pressures. Mild left atrial enlargement. Sclerotic aortic valve without stenosis. Trivial tricuspid regurgitation, unable to assess PASP. Moderate sized, circumferential pericardial effusion without clear evidence of tamponade physiology.  Assessment /  Plan:  1. Chest pain - recent DES stent placement to LAD and OM1. Has residual disease. Some improvement with nitrate therapy but still with exertional symptoms. I am increasing his Imdur today. If he does not have improvement in his symptoms, may need to consider repeat cath - he has follow up already with Dr. Lovena Le for about 2 weeks.   2. Pericardial effusion - follow up limited echo was ok with no effusion.  3. HTN   4. PAF - rate not as good today. I have increased his Metoprolol as well - this may also help with his angina.  5. Probable gout - will try colchicine - not a candidate for NSAID  Recheck his labs today. Will increase his Imdur and his Metoprolol. May need to consider repeat cath. Will see back in 2 weeks with Dr. Lovena Le.   Patient is agreeable to this plan and will call if any problems develop in the interim.   Burtis Junes, RN, [facility] [address] [place], [place]  [address] [phone]

## 2014-02-17 ENCOUNTER — Encounter: Payer: Self-pay | Admitting: Family Medicine

## 2014-02-17 ENCOUNTER — Ambulatory Visit (INDEPENDENT_AMBULATORY_CARE_PROVIDER_SITE_OTHER): Payer: 59 | Admitting: Family Medicine

## 2014-02-17 VITALS — BP 138/68 | HR 72 | Temp 98.1°F | Resp 16 | Ht 70.0 in | Wt 277.0 lb

## 2014-02-17 DIAGNOSIS — I10 Essential (primary) hypertension: Secondary | ICD-10-CM

## 2014-02-17 DIAGNOSIS — R7989 Other specified abnormal findings of blood chemistry: Secondary | ICD-10-CM

## 2014-02-17 DIAGNOSIS — E669 Obesity, unspecified: Secondary | ICD-10-CM

## 2014-02-17 DIAGNOSIS — E1169 Type 2 diabetes mellitus with other specified complication: Secondary | ICD-10-CM

## 2014-02-17 DIAGNOSIS — M109 Gout, unspecified: Secondary | ICD-10-CM | POA: Insufficient documentation

## 2014-02-17 DIAGNOSIS — Z23 Encounter for immunization: Secondary | ICD-10-CM

## 2014-02-17 DIAGNOSIS — E119 Type 2 diabetes mellitus without complications: Secondary | ICD-10-CM

## 2014-02-17 DIAGNOSIS — E890 Postprocedural hypothyroidism: Secondary | ICD-10-CM | POA: Insufficient documentation

## 2014-02-17 DIAGNOSIS — G4733 Obstructive sleep apnea (adult) (pediatric): Secondary | ICD-10-CM

## 2014-02-17 DIAGNOSIS — R634 Abnormal weight loss: Secondary | ICD-10-CM

## 2014-02-17 LAB — T4, FREE: Free T4: 5.17 ng/dL — ABNORMAL HIGH (ref 0.80–1.80)

## 2014-02-17 LAB — URIC ACID: Uric Acid, Serum: 8.7 mg/dL — ABNORMAL HIGH (ref 4.0–7.8)

## 2014-02-17 LAB — T3, FREE: T3, Free: 17.6 pg/mL — ABNORMAL HIGH (ref 2.3–4.2)

## 2014-02-17 NOTE — Assessment & Plan Note (Signed)
His weight loss is concerning. In the setting of his cardiac disease hyperthyroidism that would be contributing to this. I will obtain free T3 and T4 U. also get a radio uptake scan done a referral to endocrinology pending results

## 2014-02-17 NOTE — Assessment & Plan Note (Signed)
Increase culture seen to one tablet twice a day for the next 5 days

## 2014-02-17 NOTE — Assessment & Plan Note (Signed)
Blood pressure improved today

## 2014-02-17 NOTE — Patient Instructions (Signed)
Start new heart medications Flu shot given We will contact [facility] for your CPAP machine Thyroid ultrasound to be scheduled F/U 2 months

## 2014-02-17 NOTE — Assessment & Plan Note (Signed)
Sleep apnea noted he'll be treated with CPAP

## 2014-02-17 NOTE — Progress Notes (Signed)
Patient ID: Peter Dunlap, male   DOB: 1955/11/22, 58 y.o.   MRN: [id number]   Subjective:    Patient ID: Peter Dunlap, male    DOB: [birth year], 58 y.o.   MRN: [id number]  Patient presents for F/U Stint Placement, Discuss Labs and Handicap Parking Form  patient here to follow chronic medical problems. Since her last visit he had another bout of unstable angina and had another cardiac stent placed. He was seen by his cardiologist yesterday he's continues to have some shortness of breath and chest pain therefore imdur and  metoprolol was increased because of his heart rate.  Diabetes mellitus this is diet controlled  Weight loss he has lost 25 pounds since her last visit initially states he had been trying to lose weight but this is much faster than plain. He is up-to-date regarding his colonoscopy he's also had a chest x-ray which was benign he states he's had a prostate exam before he came to me which was also normal. His labs did reveal a significantly low TSH that needs to be worked up.   Gout- he complained of right great toe pain for the past couple days. He was diagnosed with gout by his cardiologist and given colchicine as he cannot take NSAIDs. He took one dose yesterday with minimal improvement in his pain.  Sleep apnea he has not obtained his CPAP machine yet  Review Of Systems:  GEN- denies fatigue, fever, weight loss,weakness, recent illness HEENT- denies eye drainage, change in vision, nasal discharge, CVS- denies chest pain, palpitations RESP- + SOB, cough, wheeze ABD- denies N/V, change in stools, abd pain GU- denies dysuria, hematuria, dribbling, incontinence MSK-+ joint pain, muscle aches, injury Neuro- denies headache, dizziness, syncope, seizure activity       Objective:    BP 138/68  Pulse 72  Temp(Src) 98.1 F (36.7 C) (Oral)  Resp 16  Ht 5\' 10"  (1.778 m)  Wt 277 lb (125.646 kg)  BMI 39.75 kg/m2 GEN- NAD, alert and oriented x3, obese HEENT- PERRL,  EOMI, non injected sclera, pink conjunctiva, MMM, oropharynx clear Neck- Supple, no LAD, no thyromegaly CVS- irregular rhythm, normal rate, no murmur RESP-CTAB Psych- normal affect and mood EXT- No edema , Right great toe- erythematous mild warmth, +swelling, pain with ROM Pulses- Radial, DP- 2+     Assessment & Plan:      Problem List Items Addressed This Visit   Loss of weight   Gout   Abnormal thyroid stimulating hormone (TSH) level - Primary     Check T3 and T 4 Will need thyroid uptake scan likley with his significant weight loss     Relevant Orders      T3, free      T4, free      Note: This dictation was prepared with Dragon dictation along with smaller phrase technology. Any transcriptional errors that result from this process are unintentional.

## 2014-02-17 NOTE — Assessment & Plan Note (Signed)
Check T3 and T 4 Will need thyroid uptake scan likley with his significant weight loss

## 2014-02-18 ENCOUNTER — Encounter (HOSPITAL_COMMUNITY): Payer: PRIVATE HEALTH INSURANCE

## 2014-02-20 ENCOUNTER — Encounter (HOSPITAL_COMMUNITY): Payer: PRIVATE HEALTH INSURANCE

## 2014-02-20 ENCOUNTER — Ambulatory Visit (INDEPENDENT_AMBULATORY_CARE_PROVIDER_SITE_OTHER): Payer: PRIVATE HEALTH INSURANCE

## 2014-02-20 DIAGNOSIS — I4891 Unspecified atrial fibrillation: Secondary | ICD-10-CM

## 2014-02-20 DIAGNOSIS — Z5181 Encounter for therapeutic drug level monitoring: Secondary | ICD-10-CM

## 2014-02-20 DIAGNOSIS — I480 Paroxysmal atrial fibrillation: Secondary | ICD-10-CM

## 2014-02-20 LAB — POCT INR: INR: 5.7

## 2014-02-20 MED ORDER — WARFARIN SODIUM 2 MG PO TABS: ORAL_TABLET | ORAL | Status: DC

## 2014-02-23 ENCOUNTER — Telehealth: Payer: Self-pay | Admitting: *Deleted

## 2014-02-23 ENCOUNTER — Encounter (HOSPITAL_COMMUNITY): Payer: PRIVATE HEALTH INSURANCE

## 2014-02-23 NOTE — Telephone Encounter (Signed)
Pt called stating that he has been having diarrhea now for a week and has not taken anything for it, wanted to know what he could take. I instructed pt to try Immodium for couple days and see if it will help relief him, if he does not get better to call us and will need to be seen. Pt agreed to call back if not better.

## 2014-02-25 ENCOUNTER — Encounter (HOSPITAL_COMMUNITY): Payer: PRIVATE HEALTH INSURANCE

## 2014-02-27 ENCOUNTER — Ambulatory Visit (INDEPENDENT_AMBULATORY_CARE_PROVIDER_SITE_OTHER): Payer: PRIVATE HEALTH INSURANCE | Admitting: *Deleted

## 2014-02-27 ENCOUNTER — Encounter (HOSPITAL_COMMUNITY): Payer: PRIVATE HEALTH INSURANCE

## 2014-02-27 DIAGNOSIS — I480 Paroxysmal atrial fibrillation: Secondary | ICD-10-CM

## 2014-02-27 DIAGNOSIS — Z5181 Encounter for therapeutic drug level monitoring: Secondary | ICD-10-CM

## 2014-02-27 DIAGNOSIS — I4891 Unspecified atrial fibrillation: Secondary | ICD-10-CM

## 2014-02-27 LAB — POCT INR: INR: 4.1

## 2014-03-02 ENCOUNTER — Encounter (HOSPITAL_COMMUNITY)
Admission: RE | Admit: 2014-03-02 | Discharge: 2014-03-02 | Disposition: A | Discharge status: Home / Self Care | Payer: PRIVATE HEALTH INSURANCE | Source: Ambulatory Visit | Attending: Family Medicine | Admitting: Family Medicine

## 2014-03-02 ENCOUNTER — Encounter (HOSPITAL_COMMUNITY): Payer: Self-pay

## 2014-03-02 ENCOUNTER — Encounter (HOSPITAL_COMMUNITY): Payer: PRIVATE HEALTH INSURANCE

## 2014-03-02 ENCOUNTER — Other Ambulatory Visit: Payer: Self-pay | Admitting: Internal Medicine

## 2014-03-02 DIAGNOSIS — E0520 Thyrotoxicosis with toxic multinodular goiter without thyrotoxic crisis or storm: Secondary | ICD-10-CM | POA: Insufficient documentation

## 2014-03-02 DIAGNOSIS — R634 Abnormal weight loss: Secondary | ICD-10-CM | POA: Insufficient documentation

## 2014-03-02 MED ORDER — SODIUM IODIDE I 131 CAPSULE
12.0000 | Freq: Once | INTRAVENOUS | Status: AC | PRN
  Administered 2014-03-02: 12 via ORAL

## 2014-03-03 ENCOUNTER — Encounter (HOSPITAL_COMMUNITY)
Admission: RE | Admit: 2014-03-03 | Discharge: 2014-03-03 | Disposition: A | Discharge status: Home / Self Care | Payer: PRIVATE HEALTH INSURANCE | Source: Ambulatory Visit | Attending: Family Medicine | Admitting: Family Medicine

## 2014-03-03 ENCOUNTER — Other Ambulatory Visit: Payer: Self-pay | Admitting: *Deleted

## 2014-03-03 DIAGNOSIS — E0590 Thyrotoxicosis, unspecified without thyrotoxic crisis or storm: Secondary | ICD-10-CM

## 2014-03-03 DIAGNOSIS — E042 Nontoxic multinodular goiter: Secondary | ICD-10-CM

## 2014-03-03 DIAGNOSIS — R634 Abnormal weight loss: Secondary | ICD-10-CM | POA: Diagnosis present

## 2014-03-03 MED ORDER — SODIUM PERTECHNETATE TC 99M INJECTION
10.0000 | Freq: Once | INTRAVENOUS | Status: AC | PRN
  Administered 2014-03-03: 10 via INTRAVENOUS

## 2014-03-04 ENCOUNTER — Telehealth: Payer: Self-pay | Admitting: Family Medicine

## 2014-03-04 ENCOUNTER — Encounter: Payer: Self-pay | Admitting: Internal Medicine

## 2014-03-04 ENCOUNTER — Ambulatory Visit (INDEPENDENT_AMBULATORY_CARE_PROVIDER_SITE_OTHER): Payer: PRIVATE HEALTH INSURANCE | Admitting: Internal Medicine

## 2014-03-04 ENCOUNTER — Encounter (HOSPITAL_COMMUNITY): Payer: PRIVATE HEALTH INSURANCE

## 2014-03-04 VITALS — BP 102/60 | HR 74 | Ht 72.0 in | Wt 274.8 lb

## 2014-03-04 DIAGNOSIS — I5030 Unspecified diastolic (congestive) heart failure: Secondary | ICD-10-CM | POA: Insufficient documentation

## 2014-03-04 DIAGNOSIS — I480 Paroxysmal atrial fibrillation: Secondary | ICD-10-CM

## 2014-03-04 DIAGNOSIS — I4891 Unspecified atrial fibrillation: Secondary | ICD-10-CM

## 2014-03-04 DIAGNOSIS — I509 Heart failure, unspecified: Secondary | ICD-10-CM

## 2014-03-04 DIAGNOSIS — I5032 Chronic diastolic (congestive) heart failure: Secondary | ICD-10-CM | POA: Insufficient documentation

## 2014-03-04 MED ORDER — FUROSEMIDE 20 MG PO TABS: 60.0000 mg | ORAL_TABLET | Freq: Every day | ORAL | Status: DC

## 2014-03-04 NOTE — Assessment & Plan Note (Signed)
His symptoms remain class 3B. I have asked the patient to increase his dose of lasix. I have ordered a cardiopulmonary stress test to better try and understand his functional limitations. He will be referred to our advanced heart failure clinic due to severe diastolic heart failure.

## 2014-03-04 NOTE — Patient Instructions (Signed)
Your physician has recommended you make the following change in your medication:  1) INCREASE Furosemide to 60 mg daily  Your physician has recommended that you have a cardiopulmonary stress test (CPX). CPX testing is a non-invasive measurement of heart and lung function. It replaces a traditional treadmill stress test. This type of test provides a tremendous amount of information that relates not only to your present condition but also for future outcomes. This test combines measurements of you ventilation, respiratory gas exchange in the lungs, electrocardiogram (EKG), blood pressure and physical response before, during, and following an exercise protocol.  You have been referred to Dr. Daleen Squibb for class 3 heart failure.

## 2014-03-04 NOTE — Telephone Encounter (Signed)
Patients wife emily calling to check on the appointment we were supposed to be making for his thyroid? [phone]

## 2014-03-04 NOTE — Assessment & Plan Note (Signed)
He now has persistent atrial fib. We could consider amiodarone and cardioversion. I will await CHF clinic rec's.

## 2014-03-04 NOTE — Progress Notes (Signed)
HPI [name] returns today for followup. He is a pleasant middle aged man with a h/o tobacco abuse, SVT, s/p catheter ablation, HTN and atrial fibrillation. He underwent insertion of a non-drug eluting stent to the LAD when he presented with chest pain and was found to have an 80% LAD stenosis. He still has dyspnea with exertion. He denies chest pain, syncope but also had mild peripheral edema. He was last in NSR in early July. He does not have palpitations. On echo during his previous admission, he was noted to have a moderate circumferential pericardial effusion and repeat echo demonstrated only a small effusion. He has had class 3B dyspnea. He cannot walk any significant distance, especially up an incline without getting sob and experiencing chest pressure. Allergies  Allergen Reactions  . Lactose Intolerance (Gi) Other (See Comments)    UPSET STOMACH      Current Outpatient Prescriptions  Medication Sig Dispense Refill  . aspirin EC 81 MG tablet Take 81 mg by mouth daily.      . clopidogrel (PLAVIX) 75 MG tablet Take 1 tablet (75 mg total) by mouth daily with breakfast.  30 tablet  5  . colchicine 0.6 MG tablet Take 1 tablet (0.6 mg total) by mouth 2 (two) times daily as needed.  14 tablet  1  . esomeprazole (NEXIUM) 40 MG capsule Take 1 capsule (40 mg total) by mouth daily.  30 capsule  3  . furosemide (LASIX) 20 MG tablet Take 1 tablet (20 mg total) by mouth daily.  30 tablet  3  . isosorbide mononitrate (IMDUR) 60 MG 24 hr tablet Take 1 tablet (60 mg total) by mouth daily.  90 tablet  3  . metoprolol succinate (TOPROL-XL) 50 MG 24 hr tablet Take 1 tablet (50 mg total) by mouth daily. Take with or immediately following a meal.  90 tablet  3  . nitroGLYCERIN (NITROSTAT) 0.4 MG SL tablet Place 1 tablet (0.4 mg total) under the tongue every 5 (five) minutes as needed for chest pain.  25 tablet  12  . potassium chloride SA (K-DUR,KLOR-CON) 20 MEQ tablet Take 1 tablet (20 mEq total)  by mouth daily.  30 tablet  3  . RAPAFLO 8 MG CAPS capsule Take 8 mg by mouth daily.       . simvastatin (ZOCOR) 40 MG tablet Take 40 mg by mouth daily.       . verapamil (CALAN-SR) 180 MG CR tablet TAKE 1 TABLET BY MOUTH EVERY NIGHT AT BEDTIME  30 tablet  0  . warfarin (COUMADIN) 2 MG tablet Take as directed by anticoagulation clinic  40 tablet  1   No current facility-administered medications for this visit.     Past Medical History  Diagnosis Date  . Hypertension     Severe LVH with normal EF  . Arteriosclerotic cardiovascular disease (ASCVD) 2005    catheterization in 10/2010:50% mid LAD, diffuse distal disease, circumflex irregularities, large dominant RCA with a 50% ostial, 70% distal, 60% posterolateral and 70% PDA; normal EF  . Cerebrovascular disease 2010    R. carotid endarterectomy; Duplex in 10/2010-widely patent ICAs, subtotal left vertebral-not thought to be contributing to symptoms  . Hyperlipidemia   . Obesity   . Tobacco abuse     Quit 2014  . Benign prostatic hypertrophy   . Low back pain   . History of PSVT (paroxysmal supraventricular tachycardia) 2005    Diagnosed with ILR; no recurrence following RFA  .  Cervical spine disease     CT in 2012-advanced degeneration and spondylosis with moderate spinal stenosis at C3-C6  . H/O: substance abuse     Cocaine, marijuana, alcohol.  Quit 2013.   [name] Syncope   . Gastroesophageal reflux disease   . Depression   . Erectile dysfunction   . Coronary artery disease   . Myocardial infarction   . Anginal pain   . Shortness of breath   . CHF (congestive heart failure)   . H/O hiatal hernia   . Headache(784.0)   . Arthritis   . Carotid artery occlusion   . Pericardial effusion without cardiac tamponade 12/03/2013  . Diabetes mellitus type 2 in obese 12/03/2013  . Non-ST elevation myocardial infarction (NSTEMI), initial episode of care 12/02/2013    DES LAD    ROS:   All systems reviewed and negative except as noted in the  HPI.   Past Surgical History  Procedure Laterality Date  . Radiofrequency ablation  2005    for PSVT  . Coronary angioplasty with stent placement  12/03/2013    LAD 90%-->0% W/ Promus Premier DES 3.0 mm x 16 mm, CFX OK, RCA 40%, EF 70-75%  . Carotid endarterectomy Right Feb. 25, 2010     CEA     Family History  Problem Relation Age of Onset  . Hypertension Mother     Cerebrovascular disease  . Diabetes Mother   . Coronary artery disease Father 33  . Diabetes type II Father   . Hypertension Father   . Heart attack Father   . Lung cancer Paternal Uncle   . Diabetes Sister   . Hypertension Sister   . Heart attack Sister 42  . Diabetes Brother   . Hypertension Brother      History   Social History  . Marital Status: Legally Separated    Spouse Name: N/A    Number of Children: 0  . Years of Education: N/A   Occupational History  . Retired    Social History Main Topics  . Smoking status: Former Smoker -- 1.00 packs/day for 40 years    Types: Cigarettes    Quit date: 10/10/2012  . Smokeless tobacco: Never Used     Comment: Quit in May.   . Alcohol Use: No  . Drug Use: No     Comment: quit cocaine 10/2011  . Sexual Activity: No   Other Topics Concern  . Not on file   Social History Narrative   Lives in [place].     BP 102/60  Pulse 74  Ht 6' (1.829 m)  Wt 274 lb 12.8 oz (124.648 kg)  BMI 37.26 kg/m2  Physical Exam:  Well appearing middle aged man, looks older than stated age, NAD HEENT: Unremarkable Neck:  7 cm JVD, no thyromegally Back:  No CVA tenderness Lungs:  Clear except for basilar rales HEART:  Regular rate rhythm, no murmurs, no rubs, no clicks Abd:  soft, positive bowel sounds, no organomegally, no rebound, no guarding Ext:  2 plus pulses, no edema, no cyanosis, no clubbing Skin:  No rashes no nodules Neuro:  CN II through XII intact, motor grossly intact  EKG - atrial fib with a controlled VR   Assess/Plan:

## 2014-03-05 NOTE — Telephone Encounter (Signed)
Referral was sent to [facility] endocrinologist, pending appt

## 2014-03-05 NOTE — Telephone Encounter (Signed)
Pt has appt on 03/12/14 at 1:30pm with Dr. Dorris Fetch at [facility], pt wife is aware of appt

## 2014-03-06 ENCOUNTER — Encounter (HOSPITAL_COMMUNITY): Payer: PRIVATE HEALTH INSURANCE

## 2014-03-09 ENCOUNTER — Ambulatory Visit (INDEPENDENT_AMBULATORY_CARE_PROVIDER_SITE_OTHER): Payer: PRIVATE HEALTH INSURANCE

## 2014-03-09 ENCOUNTER — Encounter (HOSPITAL_COMMUNITY): Payer: PRIVATE HEALTH INSURANCE

## 2014-03-09 DIAGNOSIS — Z5181 Encounter for therapeutic drug level monitoring: Secondary | ICD-10-CM

## 2014-03-09 DIAGNOSIS — I480 Paroxysmal atrial fibrillation: Secondary | ICD-10-CM

## 2014-03-09 LAB — POCT INR: INR: 2.4

## 2014-03-11 ENCOUNTER — Encounter (HOSPITAL_COMMUNITY): Payer: PRIVATE HEALTH INSURANCE

## 2014-03-12 ENCOUNTER — Other Ambulatory Visit (HOSPITAL_COMMUNITY): Payer: Self-pay | Admitting: "Endocrinology

## 2014-03-12 DIAGNOSIS — E0590 Thyrotoxicosis, unspecified without thyrotoxic crisis or storm: Secondary | ICD-10-CM

## 2014-03-13 ENCOUNTER — Encounter (HOSPITAL_COMMUNITY): Payer: PRIVATE HEALTH INSURANCE

## 2014-03-16 ENCOUNTER — Encounter (HOSPITAL_COMMUNITY)
Admission: RE | Admit: 2014-03-16 | Discharge: 2014-03-16 | Disposition: A | Discharge status: Home / Self Care | Payer: PRIVATE HEALTH INSURANCE | Source: Ambulatory Visit | Attending: "Endocrinology | Admitting: "Endocrinology

## 2014-03-16 ENCOUNTER — Encounter (HOSPITAL_COMMUNITY): Payer: PRIVATE HEALTH INSURANCE

## 2014-03-16 DIAGNOSIS — E0590 Thyrotoxicosis, unspecified without thyrotoxic crisis or storm: Secondary | ICD-10-CM | POA: Diagnosis present

## 2014-03-16 MED ORDER — SODIUM IODIDE I 131 CAPSULE
15.0000 | Freq: Once | INTRAVENOUS | Status: AC | PRN
  Administered 2014-03-16: 15 via ORAL

## 2014-03-17 ENCOUNTER — Encounter (HOSPITAL_COMMUNITY): Payer: PRIVATE HEALTH INSURANCE

## 2014-03-17 ENCOUNTER — Other Ambulatory Visit (HOSPITAL_COMMUNITY): Payer: Self-pay | Admitting: "Endocrinology

## 2014-03-18 ENCOUNTER — Encounter (HOSPITAL_COMMUNITY): Payer: PRIVATE HEALTH INSURANCE

## 2014-03-20 ENCOUNTER — Encounter (HOSPITAL_COMMUNITY): Payer: PRIVATE HEALTH INSURANCE

## 2014-03-23 ENCOUNTER — Encounter (HOSPITAL_COMMUNITY): Payer: PRIVATE HEALTH INSURANCE

## 2014-03-23 ENCOUNTER — Ambulatory Visit (INDEPENDENT_AMBULATORY_CARE_PROVIDER_SITE_OTHER): Payer: PRIVATE HEALTH INSURANCE | Admitting: *Deleted

## 2014-03-23 DIAGNOSIS — I480 Paroxysmal atrial fibrillation: Secondary | ICD-10-CM

## 2014-03-23 DIAGNOSIS — Z5181 Encounter for therapeutic drug level monitoring: Secondary | ICD-10-CM

## 2014-03-23 LAB — POCT INR: INR: 3.5

## 2014-03-25 ENCOUNTER — Encounter (HOSPITAL_COMMUNITY): Payer: PRIVATE HEALTH INSURANCE

## 2014-03-27 ENCOUNTER — Encounter (HOSPITAL_COMMUNITY): Payer: PRIVATE HEALTH INSURANCE

## 2014-04-01 ENCOUNTER — Ambulatory Visit (HOSPITAL_COMMUNITY)
Admission: RE | Admit: 2014-04-01 | Discharge: 2014-04-01 | Disposition: A | Discharge status: Home / Self Care | Payer: PRIVATE HEALTH INSURANCE | Source: Ambulatory Visit | Attending: Internal Medicine | Admitting: Internal Medicine

## 2014-04-01 VITALS — BP 124/58 | HR 75 | Wt 265.8 lb

## 2014-04-01 DIAGNOSIS — I481 Persistent atrial fibrillation: Secondary | ICD-10-CM | POA: Insufficient documentation

## 2014-04-01 DIAGNOSIS — I252 Old myocardial infarction: Secondary | ICD-10-CM | POA: Insufficient documentation

## 2014-04-01 DIAGNOSIS — I2510 Atherosclerotic heart disease of native coronary artery without angina pectoris: Secondary | ICD-10-CM

## 2014-04-01 DIAGNOSIS — E119 Type 2 diabetes mellitus without complications: Secondary | ICD-10-CM | POA: Insufficient documentation

## 2014-04-01 DIAGNOSIS — I422 Other hypertrophic cardiomyopathy: Secondary | ICD-10-CM | POA: Insufficient documentation

## 2014-04-01 DIAGNOSIS — I6521 Occlusion and stenosis of right carotid artery: Secondary | ICD-10-CM | POA: Insufficient documentation

## 2014-04-01 DIAGNOSIS — M10071 Idiopathic gout, right ankle and foot: Secondary | ICD-10-CM

## 2014-04-01 DIAGNOSIS — R0989 Other specified symptoms and signs involving the circulatory and respiratory systems: Secondary | ICD-10-CM

## 2014-04-01 DIAGNOSIS — I5032 Chronic diastolic (congestive) heart failure: Secondary | ICD-10-CM | POA: Insufficient documentation

## 2014-04-01 DIAGNOSIS — R0600 Dyspnea, unspecified: Secondary | ICD-10-CM

## 2014-04-01 DIAGNOSIS — Z7982 Long term (current) use of aspirin: Secondary | ICD-10-CM | POA: Insufficient documentation

## 2014-04-01 DIAGNOSIS — E785 Hyperlipidemia, unspecified: Secondary | ICD-10-CM | POA: Insufficient documentation

## 2014-04-01 DIAGNOSIS — Z7901 Long term (current) use of anticoagulants: Secondary | ICD-10-CM | POA: Diagnosis not present

## 2014-04-01 DIAGNOSIS — R634 Abnormal weight loss: Secondary | ICD-10-CM

## 2014-04-01 DIAGNOSIS — I209 Angina pectoris, unspecified: Secondary | ICD-10-CM

## 2014-04-01 DIAGNOSIS — E0590 Thyrotoxicosis, unspecified without thyrotoxic crisis or storm: Secondary | ICD-10-CM | POA: Diagnosis not present

## 2014-04-01 DIAGNOSIS — I421 Obstructive hypertrophic cardiomyopathy: Secondary | ICD-10-CM

## 2014-04-01 DIAGNOSIS — I480 Paroxysmal atrial fibrillation: Secondary | ICD-10-CM

## 2014-04-01 MED ORDER — METOPROLOL SUCCINATE ER 50 MG PO TB24
50.0000 mg | ORAL_TABLET | Freq: Two times a day (BID) | ORAL | Status: DC
Start: 2014-04-01 — End: 2014-07-23

## 2014-04-01 NOTE — Patient Instructions (Addendum)
Increase Metoprolol ER to 50 mg Twice daily   Your physician has requested that you have a cardiac MRI. Cardiac MRI uses a computer to create images of your heart as its beating, producing both still and moving pictures of your heart and major blood vessels. For further information please visit [email or web address] Please follow the instruction sheet given to you today for more information.  WE WILL SCHEDULE ONCE YOUR INSURANCE HAS APPROVED  Your physician recommends that you schedule a follow-up appointment in: 2 weeks

## 2014-04-02 DIAGNOSIS — I421 Obstructive hypertrophic cardiomyopathy: Secondary | ICD-10-CM | POA: Insufficient documentation

## 2014-04-02 NOTE — Progress Notes (Signed)
Patient ID: Peter Dunlap, male   DOB: 1955/06/19, 58 y.o.   MRN: [id number] PCP: Dr. Buelah Manis EP: Dr. Lovena Le  58 yo with history of HOCM (versus LV changes from severe long-standing HTN), CAD, persistent atrial fibrillation, and chronic diastolic CHF presents for CHF clinic evaluation.  He was admitted in 6/15 with NSTEMI and had DES to mLAD.  He was admitted again in 8/15 with unstable angina and had DES to OM1.  At the time of the 6/15 admission, he was noted to be in atrial fibrillation, and it appears that this has been persistent since then.  He is on warfarin, INR has been therapeutic.   Patient actually has felt better recently.  He says that Lasix use has helped.  He is now short of breath after walking 50-100 feet.  Long-term, he has had episodes that sound like PND (wakes up short of breath and has to sit up).  No chest pain at this point.  No lightheadedness or syncope.  He is in atrial fibrillation but does not feel palpitations.   Patient was noted recently to be hyperthyroid.  He has been treated with RAI.  He is not currently on methimazole.   ECG: atrial fibrillation at 63, QTc 495, lateral TWIs  Labs (9/15): K 4, creaitnine 0.9, BNP 448, HCT 41.3, TSH low, free T4 and free T3 elevated  PMH: 1. SVT: s/p RF ablation.  2. GERD 3. Hyperlipidemia 4. Hypertrophic obstructive cardiomyopathy (versus LV changes from severe long-standing HTN): Echo (6/15) with chordal SAM, severe LVH, EF 60%, LVOT peak gradient 53 mmHg.  Echo (8/15) with severe concentric LVH, EF 70%, SAM of anterior mitral leaflet, unable to assess LVOT gradient on this study.  5. Persistent atrial fibrillation: on warfarin.  6. Type II diabetes 7. OSA: On CPAP at night.  8. Gout 9. Carotid stenosis: s/p right CEA, followed by [facility].  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 Hyperthyroidism: s/p RAI ablation, followed by Dr. Laverta Baltimore.  12. CAD: 6/15 NSTEMI, Promus DES to mLAD with 60-70% residual RCA stenosis.  Unstable angina (8/15)  witih 80% OM1 on cath, had DES to OM1 (mLAD stent remained patent).   13. Chronic diastolic CHF 14. History of moderate pericardial effusion.   SH: Lives in [place], quit smoking in 2014, prior cocaine abuse (none now).   FH: CAD in father and sister, no history of sudden cardiac death.   ROS: All systems reviewed and negative except as per HPI.   Current Outpatient Prescriptions  Medication Sig Dispense Refill  . aspirin EC 81 MG tablet Take 81 mg by mouth daily.      . clopidogrel (PLAVIX) 75 MG tablet Take 1 tablet (75 mg total) by mouth daily with breakfast.  30 tablet  5  . colchicine 0.6 MG tablet Take 1 tablet (0.6 mg total) by mouth 2 (two) times daily as needed.  14 tablet  1  . esomeprazole (NEXIUM) 40 MG capsule Take 1 capsule (40 mg total) by mouth daily.  30 capsule  3  . furosemide (LASIX) 20 MG tablet Take 3 tablets (60 mg total) by mouth daily.  90 tablet  3  . isosorbide mononitrate (IMDUR) 60 MG 24 hr tablet Take 1 tablet (60 mg total) by mouth daily.  90 tablet  3  . metoprolol succinate (TOPROL-XL) 50 MG 24 hr tablet Take 1 tablet (50 mg total) by mouth 2 (two) times daily. Take with or immediately following a meal.  60 tablet  3  .  nitroGLYCERIN (NITROSTAT) 0.4 MG SL tablet Place 1 tablet (0.4 mg total) under the tongue every 5 (five) minutes as needed for chest pain.  25 tablet  12  . potassium chloride SA (K-DUR,KLOR-CON) 20 MEQ tablet Take 1 tablet (20 mEq total) by mouth daily.  30 tablet  3  . RAPAFLO 8 MG CAPS capsule Take 8 mg by mouth daily.       . simvastatin (ZOCOR) 40 MG tablet Take 40 mg by mouth daily.       . verapamil (CALAN-SR) 180 MG CR tablet TAKE 1 TABLET BY MOUTH EVERY NIGHT AT BEDTIME  30 tablet  0  . warfarin (COUMADIN) 2 MG tablet Take as directed by anticoagulation clinic  40 tablet  1   No current facility-administered medications for this encounter.   BP 124/58  Pulse 75  Wt 265 lb 12 oz (120.543 kg)  SpO2 96% General: NAD Neck: No  JVD, no thyromegaly or thyroid nodule.  Lungs: Clear to auscultation bilaterally with normal respiratory effort. CV: Nondisplaced PMI.  Heart irregular S1/S2, no S3/S4, 2/6 early SEM RUSB.  No peripheral edema.  No carotid bruit.  Normal pedal pulses.  Abdomen: Soft, nontender, no hepatosplenomegaly, no distention.  Skin: Intact without lesions or rashes.  Neurologic: Alert and oriented x 3.  Psych: Normal affect. Extremities: No clubbing or cyanosis.  HEENT: Normal.   Assessment/plan: 1. Atrial fibrillation: Persistent.  Atrial fibrillation is often poorly tolerated with HOCM, which Mr Mcgrory is suspected to have.  He has been more dyspneic over the last few months.  I think it would be reasonable to try to get him out of atrial fibrillation.  I suspect that he will need an antiarrhythmic to hold NSR.  He is not a candidate for dofetilide or sotalol with baseline QTc prolongation.  Therefore, amiodarone is the best option.  This is a bit complicated by the recent diagnosis of hyperthyroidism and RAI treatment. I will try to get records from his endocrinologist (Dr Laverta Baltimore).  If RAI was successful, presumably it would be reasonably safe to start amiodarone (hypothyroidism could always be treated with thyroid replacement).   - Continue warfarin and Toprol XL/verapamil for rate control.   - I will see him back in 2 wks.  Will work on getting endocrinology records.  At followup, I will likely start him on amiodarone with plan for DCCV after amiodarone load.  Continue to monitor weekly INRs until DCCV.  2. Hypertrophic cardiomyopathy: Concentric severe LVH (not asymmetric) but there appears to be mitral valve SAM with LVOT gradient.  Echoes are difficult.  - I will arrange cardiac MRI to better assess HOCM and also for SCD risk stratification (looking for amount of scar in LV).  - Continue verapamil and Toprol XL to try to lower LVOT gradient.  I will increase Toprol XL to 50 mg bid.  3. CAD: No further  chest pain.  He will continue on ASA 81, Plavix, and statin.  4. Hyperlipidemia: Check lipids with goal LDL < 70.  5. Carotid stenosis: Followed by [facility].   Loralie Champagne 04/02/2014

## 2014-04-06 ENCOUNTER — Ambulatory Visit (INDEPENDENT_AMBULATORY_CARE_PROVIDER_SITE_OTHER): Payer: PRIVATE HEALTH INSURANCE

## 2014-04-06 DIAGNOSIS — I480 Paroxysmal atrial fibrillation: Secondary | ICD-10-CM

## 2014-04-06 DIAGNOSIS — Z5181 Encounter for therapeutic drug level monitoring: Secondary | ICD-10-CM

## 2014-04-06 LAB — POCT INR: INR: 3.1

## 2014-04-07 ENCOUNTER — Other Ambulatory Visit: Payer: Self-pay | Admitting: Internal Medicine

## 2014-04-10 ENCOUNTER — Inpatient Hospital Stay (HOSPITAL_COMMUNITY)
Admission: EM | Admit: 2014-04-10 | Discharge: 2014-04-17 | DRG: 027 | Disposition: A | Discharge status: Home / Self Care | Payer: PRIVATE HEALTH INSURANCE | Attending: Neurosurgery | Admitting: Neurosurgery

## 2014-04-10 ENCOUNTER — Encounter (HOSPITAL_COMMUNITY): Payer: Self-pay | Admitting: Emergency Medicine

## 2014-04-10 ENCOUNTER — Emergency Department (HOSPITAL_COMMUNITY): Payer: PRIVATE HEALTH INSURANCE

## 2014-04-10 ENCOUNTER — Telehealth: Payer: Self-pay | Admitting: *Deleted

## 2014-04-10 DIAGNOSIS — I6202 Nontraumatic subacute subdural hemorrhage: Principal | ICD-10-CM | POA: Diagnosis present

## 2014-04-10 DIAGNOSIS — Z801 Family history of malignant neoplasm of trachea, bronchus and lung: Secondary | ICD-10-CM | POA: Diagnosis not present

## 2014-04-10 DIAGNOSIS — I509 Heart failure, unspecified: Secondary | ICD-10-CM | POA: Diagnosis present

## 2014-04-10 DIAGNOSIS — Z87891 Personal history of nicotine dependence: Secondary | ICD-10-CM | POA: Diagnosis not present

## 2014-04-10 DIAGNOSIS — S065X9A Traumatic subdural hemorrhage with loss of consciousness of unspecified duration, initial encounter: Secondary | ICD-10-CM

## 2014-04-10 DIAGNOSIS — Z79899 Other long term (current) drug therapy: Secondary | ICD-10-CM

## 2014-04-10 DIAGNOSIS — T45515A Adverse effect of anticoagulants, initial encounter: Secondary | ICD-10-CM | POA: Diagnosis present

## 2014-04-10 DIAGNOSIS — Z833 Family history of diabetes mellitus: Secondary | ICD-10-CM | POA: Diagnosis not present

## 2014-04-10 DIAGNOSIS — Z955 Presence of coronary angioplasty implant and graft: Secondary | ICD-10-CM | POA: Diagnosis not present

## 2014-04-10 DIAGNOSIS — I10 Essential (primary) hypertension: Secondary | ICD-10-CM | POA: Diagnosis present

## 2014-04-10 DIAGNOSIS — T39015A Adverse effect of aspirin, initial encounter: Secondary | ICD-10-CM | POA: Diagnosis present

## 2014-04-10 DIAGNOSIS — I25119 Atherosclerotic heart disease of native coronary artery with unspecified angina pectoris: Secondary | ICD-10-CM | POA: Diagnosis present

## 2014-04-10 DIAGNOSIS — E669 Obesity, unspecified: Secondary | ICD-10-CM | POA: Diagnosis present

## 2014-04-10 DIAGNOSIS — Z8249 Family history of ischemic heart disease and other diseases of the circulatory system: Secondary | ICD-10-CM

## 2014-04-10 DIAGNOSIS — N400 Enlarged prostate without lower urinary tract symptoms: Secondary | ICD-10-CM | POA: Diagnosis present

## 2014-04-10 DIAGNOSIS — Z8679 Personal history of other diseases of the circulatory system: Secondary | ICD-10-CM | POA: Diagnosis present

## 2014-04-10 DIAGNOSIS — E119 Type 2 diabetes mellitus without complications: Secondary | ICD-10-CM | POA: Diagnosis present

## 2014-04-10 DIAGNOSIS — Z7902 Long term (current) use of antithrombotics/antiplatelets: Secondary | ICD-10-CM | POA: Diagnosis not present

## 2014-04-10 DIAGNOSIS — R0602 Shortness of breath: Secondary | ICD-10-CM

## 2014-04-10 DIAGNOSIS — E739 Lactose intolerance, unspecified: Secondary | ICD-10-CM | POA: Diagnosis present

## 2014-04-10 DIAGNOSIS — K219 Gastro-esophageal reflux disease without esophagitis: Secondary | ICD-10-CM | POA: Diagnosis present

## 2014-04-10 DIAGNOSIS — Z09 Encounter for follow-up examination after completed treatment for conditions other than malignant neoplasm: Secondary | ICD-10-CM

## 2014-04-10 DIAGNOSIS — E785 Hyperlipidemia, unspecified: Secondary | ICD-10-CM | POA: Diagnosis present

## 2014-04-10 DIAGNOSIS — S065XAA Traumatic subdural hemorrhage with loss of consciousness status unknown, initial encounter: Secondary | ICD-10-CM

## 2014-04-10 DIAGNOSIS — R4182 Altered mental status, unspecified: Secondary | ICD-10-CM | POA: Diagnosis present

## 2014-04-10 DIAGNOSIS — Z9889 Other specified postprocedural states: Secondary | ICD-10-CM

## 2014-04-10 HISTORY — DX: Disorder of thyroid, unspecified: E07.9

## 2014-04-10 LAB — CBC WITH DIFFERENTIAL/PLATELET
Basophils Absolute: 0.1 10*3/uL (ref 0.0–0.1)
Basophils Relative: 1 % (ref 0–1)
Eosinophils Absolute: 0.2 10*3/uL (ref 0.0–0.7)
Eosinophils Relative: 2 % (ref 0–5)
HCT: 39.3 % (ref 39.0–52.0)
Hemoglobin: 13.5 g/dL (ref 13.0–17.0)
Lymphocytes Relative: 34 % (ref 12–46)
Lymphs Abs: 3.2 10*3/uL (ref 0.7–4.0)
MCH: 30.5 pg (ref 26.0–34.0)
MCHC: 34.4 g/dL (ref 30.0–36.0)
MCV: 88.9 fL (ref 78.0–100.0)
Monocytes Absolute: 0.9 10*3/uL (ref 0.1–1.0)
Monocytes Relative: 10 % (ref 3–12)
Neutro Abs: 5.2 10*3/uL (ref 1.7–7.7)
Neutrophils Relative %: 53 % (ref 43–77)
Platelets: 210 10*3/uL (ref 150–400)
RBC: 4.42 MIL/uL (ref 4.22–5.81)
RDW: 16.4 % — ABNORMAL HIGH (ref 11.5–15.5)
WBC: 9.6 10*3/uL (ref 4.0–10.5)

## 2014-04-10 LAB — URINALYSIS, ROUTINE W REFLEX MICROSCOPIC
Bilirubin Urine: NEGATIVE
Glucose, UA: NEGATIVE mg/dL
Ketones, ur: NEGATIVE mg/dL
Nitrite: NEGATIVE
Protein, ur: NEGATIVE mg/dL
Specific Gravity, Urine: 1.015 (ref 1.005–1.030)
Urobilinogen, UA: 2 mg/dL — ABNORMAL HIGH (ref 0.0–1.0)
pH: 6 (ref 5.0–8.0)

## 2014-04-10 LAB — PRO B NATRIURETIC PEPTIDE: Pro B Natriuretic peptide (BNP): 1873 pg/mL — ABNORMAL HIGH (ref 0–125)

## 2014-04-10 LAB — URINE MICROSCOPIC-ADD ON

## 2014-04-10 LAB — COMPREHENSIVE METABOLIC PANEL
ALT: 8 U/L (ref 0–53)
AST: 17 U/L (ref 0–37)
Albumin: 3.2 g/dL — ABNORMAL LOW (ref 3.5–5.2)
Alkaline Phosphatase: 95 U/L (ref 39–117)
Anion gap: 14 (ref 5–15)
BUN: 6 mg/dL (ref 6–23)
CO2: 25 mEq/L (ref 19–32)
Calcium: 8.9 mg/dL (ref 8.4–10.5)
Chloride: 101 mEq/L (ref 96–112)
Creatinine, Ser: 0.93 mg/dL (ref 0.50–1.35)
GFR calc Af Amer: >90 mL/min (ref >90)
GFR calc non Af Amer: >90 mL/min (ref >90)
Glucose, Bld: 95 mg/dL (ref 70–99)
Potassium: 3.7 mEq/L (ref 3.7–5.3)
Sodium: 140 mEq/L (ref 137–147)
Total Bilirubin: 0.7 mg/dL (ref 0.3–1.2)
Total Protein: 7.6 g/dL (ref 6.0–8.3)

## 2014-04-10 LAB — TYPE AND SCREEN
ABO/RH(D): O POS
Antibody Screen: NEGATIVE

## 2014-04-10 LAB — TROPONIN I: Troponin I: <0.3 ng/mL (ref <0.30)

## 2014-04-10 LAB — PROTIME-INR
INR: 2.86 — ABNORMAL HIGH (ref 0.00–1.49)
INR: 1.16 (ref 0.00–1.49)
Prothrombin Time: 30.2 seconds — ABNORMAL HIGH (ref 11.6–15.2)
Prothrombin Time: 15 seconds (ref 11.6–15.2)

## 2014-04-10 LAB — MRSA PCR SCREENING: MRSA by PCR: NEGATIVE

## 2014-04-10 MED ORDER — MORPHINE SULFATE 2 MG/ML IJ SOLN
2.0000 mg | INTRAMUSCULAR | Status: DC | PRN
  Administered 2014-04-10: 2 mg via INTRAVENOUS
  Administered 2014-04-10: 4 mg via INTRAVENOUS
  Administered 2014-04-13 – 2014-04-16 (×3): 2 mg via INTRAVENOUS
  Filled 2014-04-10: qty 1
  Filled 2014-04-10: qty 2
  Filled 2014-04-10 (×3): qty 1

## 2014-04-10 MED ORDER — HYDROMORPHONE HCL 1 MG/ML IJ SOLN
1.0000 mg | INTRAMUSCULAR | Status: DC | PRN
  Administered 2014-04-10 – 2014-04-14 (×4): 1 mg via INTRAVENOUS
  Filled 2014-04-10 (×4): qty 1

## 2014-04-10 MED ORDER — ACETAMINOPHEN 650 MG RE SUPP: 650.0000 mg | Freq: Four times a day (QID) | RECTAL | Status: DC | PRN

## 2014-04-10 MED ORDER — OXYCODONE HCL 5 MG PO TABS
5.0000 mg | ORAL_TABLET | ORAL | Status: DC | PRN
  Administered 2014-04-10 – 2014-04-16 (×13): 5 mg via ORAL
  Filled 2014-04-10 (×13): qty 1

## 2014-04-10 MED ORDER — SIMVASTATIN 40 MG PO TABS
40.0000 mg | ORAL_TABLET | Freq: Every day | ORAL | Status: DC
  Filled 2014-04-10: qty 1

## 2014-04-10 MED ORDER — PANTOPRAZOLE SODIUM 40 MG PO TBEC
80.0000 mg | DELAYED_RELEASE_TABLET | Freq: Every day | ORAL | Status: DC
  Administered 2014-04-11 – 2014-04-17 (×7): 80 mg via ORAL
  Filled 2014-04-10 (×7): qty 2

## 2014-04-10 MED ORDER — ACETAMINOPHEN 325 MG PO TABS
650.0000 mg | ORAL_TABLET | Freq: Four times a day (QID) | ORAL | Status: DC | PRN
  Administered 2014-04-11 – 2014-04-12 (×2): 650 mg via ORAL
  Filled 2014-04-10 (×2): qty 2

## 2014-04-10 MED ORDER — METOPROLOL SUCCINATE ER 50 MG PO TB24
50.0000 mg | ORAL_TABLET | Freq: Two times a day (BID) | ORAL | Status: DC
  Administered 2014-04-10 – 2014-04-14 (×5): 50 mg via ORAL
  Filled 2014-04-10 (×11): qty 1

## 2014-04-10 MED ORDER — ONDANSETRON HCL 4 MG PO TABS
4.0000 mg | ORAL_TABLET | Freq: Four times a day (QID) | ORAL | Status: DC | PRN
  Administered 2014-04-11: 4 mg via ORAL
  Filled 2014-04-10: qty 1

## 2014-04-10 MED ORDER — POTASSIUM CHLORIDE CRYS ER 20 MEQ PO TBCR
20.0000 meq | EXTENDED_RELEASE_TABLET | Freq: Every day | ORAL | Status: DC
  Administered 2014-04-11 – 2014-04-17 (×6): 20 meq via ORAL
  Filled 2014-04-10 (×7): qty 1

## 2014-04-10 MED ORDER — VITAMIN K1 10 MG/ML IJ SOLN
10.0000 mg | INTRAVENOUS | Status: AC
  Administered 2014-04-10: 10 mg via INTRAVENOUS
  Filled 2014-04-10: qty 1

## 2014-04-10 MED ORDER — VITAMIN K1 10 MG/ML IJ SOLN
10.0000 mg | Freq: Once | INTRAMUSCULAR | Status: AC
  Administered 2014-04-10: 10 mg via SUBCUTANEOUS
  Filled 2014-04-10: qty 1

## 2014-04-10 MED ORDER — TAMSULOSIN HCL 0.4 MG PO CAPS
0.4000 mg | ORAL_CAPSULE | Freq: Every day | ORAL | Status: DC
  Administered 2014-04-11 – 2014-04-16 (×5): 0.4 mg via ORAL
  Filled 2014-04-10 (×7): qty 1

## 2014-04-10 MED ORDER — PROTHROMBIN COMPLEX CONC HUMAN 500 UNITS IV KIT
25.0000 [IU]/kg | PACK | INTRAVENOUS | Status: DC
  Filled 2014-04-10: qty 120

## 2014-04-10 MED ORDER — ONDANSETRON HCL 4 MG/2ML IJ SOLN
4.0000 mg | Freq: Four times a day (QID) | INTRAMUSCULAR | Status: DC | PRN
  Administered 2014-04-11 – 2014-04-14 (×5): 4 mg via INTRAVENOUS
  Filled 2014-04-10 (×5): qty 2

## 2014-04-10 MED ORDER — ACETAMINOPHEN 500 MG PO TABS: 500.0000 mg | ORAL_TABLET | Freq: Four times a day (QID) | ORAL | Status: DC | PRN

## 2014-04-10 MED ORDER — VERAPAMIL HCL ER 180 MG PO TBCR
180.0000 mg | EXTENDED_RELEASE_TABLET | Freq: Every day | ORAL | Status: DC
  Administered 2014-04-11 – 2014-04-14 (×3): 180 mg via ORAL
  Filled 2014-04-10 (×5): qty 1

## 2014-04-10 MED ORDER — ALUM & MAG HYDROXIDE-SIMETH 200-200-20 MG/5ML PO SUSP: 30.0000 mL | Freq: Four times a day (QID) | ORAL | Status: DC | PRN

## 2014-04-10 MED ORDER — ISOSORBIDE MONONITRATE ER 30 MG PO TB24
60.0000 mg | ORAL_TABLET | Freq: Every day | ORAL | Status: DC
Start: 2014-04-11 — End: 2014-04-17
  Administered 2014-04-11 – 2014-04-17 (×6): 60 mg via ORAL
  Filled 2014-04-10 (×2): qty 1
  Filled 2014-04-10: qty 2
  Filled 2014-04-10 (×4): qty 1

## 2014-04-10 MED ORDER — PROTHROMBIN COMPLEX CONC HUMAN 500 UNITS IV KIT
2859.0000 [IU] | PACK | Status: AC
  Administered 2014-04-10: 2859 [IU] via INTRAVENOUS
  Filled 2014-04-10: qty 114

## 2014-04-10 MED ORDER — FUROSEMIDE 20 MG PO TABS
60.0000 mg | ORAL_TABLET | Freq: Every day | ORAL | Status: DC
  Administered 2014-04-11 – 2014-04-17 (×7): 60 mg via ORAL
  Filled 2014-04-10 (×8): qty 1

## 2014-04-10 MED ORDER — NITROGLYCERIN 0.4 MG SL SUBL: 0.4000 mg | SUBLINGUAL_TABLET | SUBLINGUAL | Status: DC | PRN

## 2014-04-10 NOTE — ED Provider Notes (Signed)
CSN: [id number]     Arrival date & time 04/10/14  1315 History   First MD Initiated Contact with Patient 04/10/14 1420    This chart was scribed for Charlie Pitter, MD by Terressa Koyanagi, ED Scribe. This patient was seen in room APOTF/OTF and the patient's care was started at 2:22 PM.  Chief Complaint  Patient presents with  . Headache  . Hematuria  . Altered Mental Status    Patient is a 58 y.o. male presenting with hematuria and altered mental status. The history is provided by the patient and a relative. No language interpreter was used.  Hematuria Associated symptoms include headaches.  Altered Mental Status Presenting symptoms: confusion   Associated symptoms: headaches, nausea and vomiting   Associated symptoms: no fever    PCP: [name], MD HPI Comments: Peter Dunlap is a 58 y.o. male who presents to the Emergency Department complaining of acute, intermittent, atraumatic confusion with associated dizziness, emesis and HAs onset two weeks ago. Pt reports that a couple of days ago he was unable to find his way home; pt denies any Hx of similar episodes. Pt reports he is on blood thinners and notes that he took a radiation pill for his overactive thyroid one week ago. Pt denies fever, chills, diarrhea, or dysuria. Per pt's sisters, pt is at baseline at present.   Past Medical History  Diagnosis Date  . Hypertension     Severe LVH with normal EF  . Arteriosclerotic cardiovascular disease (ASCVD) 2005    catheterization in 10/2010:50% mid LAD, diffuse distal disease, circumflex irregularities, large dominant RCA with a 50% ostial, 70% distal, 60% posterolateral and 70% PDA; normal EF  . Cerebrovascular disease 2010    R. carotid endarterectomy; Duplex in 10/2010-widely patent ICAs, subtotal left vertebral-not thought to be contributing to symptoms  . Hyperlipidemia   . Obesity   . Tobacco abuse     Quit 2014  . Benign prostatic hypertrophy   . Low back pain   . History of  PSVT (paroxysmal supraventricular tachycardia) 2005    Diagnosed with ILR; no recurrence following RFA  . Cervical spine disease     CT in 2012-advanced degeneration and spondylosis with moderate spinal stenosis at C3-C6  . H/O: substance abuse     Cocaine, marijuana, alcohol.  Quit 2013.   [name] Syncope   . Gastroesophageal reflux disease   . Depression   . Erectile dysfunction   . Coronary artery disease   . Myocardial infarction   . Anginal pain   . Shortness of breath   . CHF (congestive heart failure)   . H/O hiatal hernia   . Headache(784.0)   . Arthritis   . Carotid artery occlusion   . Pericardial effusion without cardiac tamponade 12/03/2013  . Diabetes mellitus type 2 in obese 12/03/2013  . Non-ST elevation myocardial infarction (NSTEMI), initial episode of care 12/02/2013    DES LAD  . Thyroid disease    Past Surgical History  Procedure Laterality Date  . Radiofrequency ablation  2005    for PSVT  . Coronary angioplasty with stent placement  12/03/2013    LAD 90%-->0% W/ Promus Premier DES 3.0 mm x 16 mm, CFX OK, RCA 40%, EF 70-75%  . Carotid endarterectomy Right Feb. 25, 2010     CEA   Family History  Problem Relation Age of Onset  . Hypertension Mother     Cerebrovascular disease  . Diabetes Mother   . Coronary artery disease  Father 41  . Diabetes type II Father   . Hypertension Father   . Heart attack Father   . Lung cancer Paternal Uncle   . Diabetes Sister   . Hypertension Sister   . Heart attack Sister 28  . Diabetes Brother   . Hypertension Brother    History  Substance Use Topics  . Smoking status: Former Smoker -- 1.00 packs/day for 40 years    Types: Cigarettes    Quit date: 10/10/2012  . Smokeless tobacco: Never Used     Comment: Quit in May.   . Alcohol Use: No    Review of Systems  Constitutional: Negative for fever and chills.  Gastrointestinal: Positive for nausea and vomiting. Negative for diarrhea.  Genitourinary: Negative for  dysuria.  Neurological: Positive for dizziness and headaches.  Psychiatric/Behavioral: Positive for confusion.  All other systems reviewed and are negative.  Allergies  Lactose intolerance (gi)  Home Medications   Prior to Admission medications   Medication Sig Start Date End Date Taking? Authorizing Provider  acetaminophen (TYLENOL) 500 MG tablet Take 500 mg by mouth every 6 (six) hours as needed for moderate pain.   Yes Historical Provider, MD  aspirin EC 81 MG tablet Take 81 mg by mouth daily.   Yes Historical Provider, MD  clopidogrel (PLAVIX) 75 MG tablet Take 1 tablet (75 mg total) by mouth daily with breakfast. 01/27/14  Yes Brett Canales, PA-C  colchicine 0.6 MG tablet Take 1 tablet (0.6 mg total) by mouth 2 (two) times daily as needed. 02/16/14  Yes Burtis Junes, NP  esomeprazole (NEXIUM) 40 MG capsule Take 1 capsule (40 mg total) by mouth daily. 01/05/14  Yes [name], MD  furosemide (LASIX) 20 MG tablet Take 3 tablets (60 mg total) by mouth daily. 03/04/14  Yes [name], MD  isosorbide mononitrate (IMDUR) 60 MG 24 hr tablet Take 1 tablet (60 mg total) by mouth daily. 02/16/14  Yes Burtis Junes, NP  metoprolol succinate (TOPROL-XL) 50 MG 24 hr tablet Take 1 tablet (50 mg total) by mouth 2 (two) times daily. Take with or immediately following a meal. 04/01/14  Yes Larey Dresser, MD  nitroGLYCERIN (NITROSTAT) 0.4 MG SL tablet Place 1 tablet (0.4 mg total) under the tongue every 5 (five) minutes as needed for chest pain. 12/05/13  Yes Rhonda G Barrett, PA-C  potassium chloride SA (K-DUR,KLOR-CON) 20 MEQ tablet Take 1 tablet (20 mEq total) by mouth daily. 02/11/14  Yes Burtis Junes, NP  RAPAFLO 8 MG CAPS capsule Take 8 mg by mouth daily.  10/21/13  Yes Historical Provider, MD  simvastatin (ZOCOR) 40 MG tablet Take 40 mg by mouth daily.    Yes Historical Provider, MD  verapamil (CALAN-SR) 180 MG CR tablet TAKE 1 TABLET BY MOUTH EVERY NIGHT AT BEDTIME 04/09/14  Yes [name], MD  warfarin (COUMADIN) 1 MG tablet Take 1 mg by mouth daily. Take on Monday, Wednesday, and Friday.   Yes Historical Provider, MD  warfarin (COUMADIN) 2 MG tablet Take as directed by anticoagulation clinic Patient taking differently: Take 2 mg by mouth daily. Take on Tuesday, Thursday, Saturday, and Sunday. 02/20/14  Yes [name], MD   Triage Vitals: BP 122/62 mmHg  Pulse 70  Temp(Src) 98.2 F (36.8 C) (Oral)  Resp 20  Ht 6' (1.829 m)  SpO2 95% Physical Exam  Constitutional: He is oriented to person, place, and time. He appears well-developed and well-nourished. No distress.  Appropriate   HENT:  Head: Normocephalic and atraumatic.  Eyes: Conjunctivae and EOM are normal.  Neck: Neck supple. No tracheal deviation present.  Cardiovascular: Normal rate.   Pulmonary/Chest: Effort normal. No respiratory distress.  Musculoskeletal: Normal range of motion. He exhibits edema (mild, lower extremity, bilateral edema).  Moving all extremities  Neurological: He is alert and oriented to person, place, and time.  Skin: Skin is warm and dry.  Psychiatric: He has a normal mood and affect. His behavior is normal.  Nursing note and vitals reviewed.   ED Course  Procedures (including critical care time) DIAGNOSTIC STUDIES: Oxygen Saturation is 95% on RA, adequate by my interpretation.    COORDINATION OF CARE: 2:26 PM-Discussed treatment plan which includes meds and imaging with pt at bedside and pt agreed to plan.   Labs Review Labs Reviewed  CBC WITH DIFFERENTIAL - Abnormal; Notable for the following:    RDW 16.4 (*)    All other components within normal limits  PROTIME-INR - Abnormal; Notable for the following:    Prothrombin Time 30.2 (*)    INR 2.86 (*)    All other components within normal limits  PRO B NATRIURETIC PEPTIDE - Abnormal; Notable for the following:    Pro B Natriuretic peptide (BNP) 1873.0 (*)    All other components within normal limits  COMPREHENSIVE  METABOLIC PANEL - Abnormal; Notable for the following:    Albumin 3.2 (*)    All other components within normal limits  URINALYSIS, ROUTINE W REFLEX MICROSCOPIC - Abnormal; Notable for the following:    Hgb urine dipstick TRACE (*)    Urobilinogen, UA 2.0 (*)    Leukocytes, UA MODERATE (*)    All other components within normal limits  TROPONIN I  URINE MICROSCOPIC-ADD ON  TYPE AND SCREEN    Imaging Review Dg Chest 2 View  04/10/2014   CLINICAL DATA:  Pt. Is complaining of acute, intermittent, atraumatic confusion with associated dizziness, emesis and HAs onset two weeks ago. Pt reports that a couple of days ago he was unable to find his way home; pt denies any Hx of similar episodes. Pt reports he is on blood thinners and notes that he took a radiation pill for his overactive thyroid one week ago. Patient also with complaints of shortness of breath. History of coronary angioplasty with stent placement.  EXAM: CHEST  2 VIEW  COMPARISON:  01/21/2014.  FINDINGS: Cardiac silhouette is mildly enlarged. Normal mediastinal and hilar contours.  Clear lungs.  No pleural effusion or pneumothorax.  Bony thorax is unremarkable.  IMPRESSION: No acute cardiopulmonary disease.   Electronically Signed   By: Lajean Manes M.D.   On: 04/10/2014 15:52   Mr Brain Wo Contrast  04/10/2014   CLINICAL DATA:  Increased confusion with headaches and altered mental status for the past couple of weeks. No known head injury.  EXAM: MRI HEAD WITHOUT CONTRAST  TECHNIQUE: Multiplanar, multiecho pulse sequences of the brain and surrounding structures were obtained without intravenous contrast.  COMPARISON:  10/18/2010  FINDINGS: There is a new subdural hematoma extending along the right cerebral convexity which measures up to approximately 2.0 cm in thickness. The hematoma is heterogeneously hyperintense on T1 and T2 weighted images with internal complexity/ septations. There is partial effacement of the right lateral ventricle  with 1.4 cm of leftward midline shaft. There is new, mild dilatation of the left lateral ventricle, most notably of the temporal and occipital horns. Mild T2 hyperintensity surrounding the left temporal and  occipital horns is consistent with transependymal CSF flow. There is no acute infarct or mass.  Orbits are unremarkable. Paranasal sinuses and mastoid air cells are clear. Major intracranial vascular flow voids are preserved.  IMPRESSION: Moderate-sized, subacute right-sided subdural hematoma. There is 1.4 cm of midline shift with trapping of the left lateral ventricle.  Critical Value/emergent results were called by telephone at the time of interpretation on 04/10/2014 at 3:37 pm to Dr. Davonna Belling , who verbally acknowledged these results.   Electronically Signed   By: Logan Bores   On: 04/10/2014 15:38     EKG Interpretation None      MDM   Final diagnoses:  SOB (shortness of breath)  Subdural hematoma    Patient with headache and some confusion for 2 weeks. He is on Coumadin and Plavix. CT scan was not functional at time of arrival and MRI shows subdural hematoma. No trauma. Discussed with Dr. Trenton Gammon from neurosurgery and he believes this is not an active bleed. Recommended 10 mg of vitamin K and transfer down to causes [facility] to the ICU. No feiba at this time. CRITICAL CARE Performed by: Mackie Pai Total critical care time: 30 Critical care time was exclusive of separately billable procedures and treating other patients. Critical care was necessary to treat or prevent imminent or life-threatening deterioration. Critical care was time spent personally by me on the following activities: development of treatment plan with patient and/or surrogate as well as nursing, discussions with consultants, evaluation of patient's response to treatment, examination of patient, obtaining history from patient or surrogate, ordering and performing treatments and interventions,  ordering and review of laboratory studies, ordering and review of radiographic studies, pulse oximetry and re-evaluation of patient's condition.    I personally performed the services described in this documentation, which was scribed in my presence. The recorded information has been reviewed and is accurate.     Jasper Riling. Alvino Chapel, MD 04/10/14 1735

## 2014-04-10 NOTE — ED Notes (Signed)
Pt's family brought pt to hospital because pt has been confused, dizzy, vomiting, and headaches for about two weeks. Pt also states he has been having hematuria since yesterday. Denies pain with urination.

## 2014-04-10 NOTE — Telephone Encounter (Signed)
Pt called stating that has severe headache and becoming disoriented wanted to be seen here at [facility] today, looked at schedule to see if anyone can see him and do not, advised pt to go to ED or UC to be seen. Pt states please he wants to be seen and I informed pt that we would send him over to ED from this office. Pt agrees to go to ED.

## 2014-04-10 NOTE — ED Notes (Signed)
Pt transported to MRI. Dr. Alvino Chapel aware that labs will be drawn when pt out of MRI.

## 2014-04-10 NOTE — ED Notes (Signed)
Patient is resting comfortably. 

## 2014-04-10 NOTE — H&P (Signed)
Peter Dunlap is an 58 y.o. male.   Chief Complaint: headache HPI: 58 year old male with very significant cardiac disease on aspirin and Coumadin as anticoagulant presents with progressive headaches cognitive problems and unsteadiness of gait. He denies any trauma. He has had no seizures. He remains and auditory and only complains of a headache currently.  Past Medical History  Diagnosis Date  . Hypertension     Severe LVH with normal EF  . Arteriosclerotic cardiovascular disease (ASCVD) 2005    catheterization in 10/2010:50% mid LAD, diffuse distal disease, circumflex irregularities, large dominant RCA with a 50% ostial, 70% distal, 60% posterolateral and 70% PDA; normal EF  . Cerebrovascular disease 2010    R. carotid endarterectomy; Duplex in 10/2010-widely patent ICAs, subtotal left vertebral-not thought to be contributing to symptoms  . Hyperlipidemia   . Obesity   . Tobacco abuse     Quit 2014  . Benign prostatic hypertrophy   . Low back pain   . History of PSVT (paroxysmal supraventricular tachycardia) 2005    Diagnosed with ILR; no recurrence following RFA  . Cervical spine disease     CT in 2012-advanced degeneration and spondylosis with moderate spinal stenosis at C3-C6  . H/O: substance abuse     Cocaine, marijuana, alcohol.  Quit 2013.   [name] Syncope   . Gastroesophageal reflux disease   . Depression   . Erectile dysfunction   . Coronary artery disease   . Myocardial infarction   . Anginal pain   . Shortness of breath   . CHF (congestive heart failure)   . H/O hiatal hernia   . Headache(784.0)   . Arthritis   . Carotid artery occlusion   . Pericardial effusion without cardiac tamponade 12/03/2013  . Diabetes mellitus type 2 in obese 12/03/2013  . Non-ST elevation myocardial infarction (NSTEMI), initial episode of care 12/02/2013    DES LAD  . Thyroid disease     Past Surgical History  Procedure Laterality Date  . Radiofrequency ablation  2005    for PSVT  .  Coronary angioplasty with stent placement  12/03/2013    LAD 90%-->0% W/ Promus Premier DES 3.0 mm x 16 mm, CFX OK, RCA 40%, EF 70-75%  . Carotid endarterectomy Right Feb. 25, 2010     CEA    Family History  Problem Relation Age of Onset  . Hypertension Mother     Cerebrovascular disease  . Diabetes Mother   . Coronary artery disease Father 58  . Diabetes type II Father   . Hypertension Father   . Heart attack Father   . Lung cancer Paternal Uncle   . Diabetes Sister   . Hypertension Sister   . Heart attack Sister 28  . Diabetes Brother   . Hypertension Brother    Social History:  reports that he quit smoking about 17 months ago. His smoking use included Cigarettes. He has a 40 pack-year smoking history. He has never used smokeless tobacco. He reports that he does not drink alcohol or use illicit drugs.  Allergies:  Allergies  Allergen Reactions  . Lactose Intolerance (Gi) Other (See Comments)    UPSET STOMACH     Medications Prior to Admission  Medication Sig Dispense Refill  . acetaminophen (TYLENOL) 500 MG tablet Take 500 mg by mouth every 6 (six) hours as needed for moderate pain.    [name] aspirin EC 81 MG tablet Take 81 mg by mouth daily.    . clopidogrel (PLAVIX) 75 MG tablet  Take 1 tablet (75 mg total) by mouth daily with breakfast. 30 tablet 5  . colchicine 0.6 MG tablet Take 1 tablet (0.6 mg total) by mouth 2 (two) times daily as needed. 14 tablet 1  . esomeprazole (NEXIUM) 40 MG capsule Take 1 capsule (40 mg total) by mouth daily. 30 capsule 3  . furosemide (LASIX) 20 MG tablet Take 3 tablets (60 mg total) by mouth daily. 90 tablet 3  . isosorbide mononitrate (IMDUR) 60 MG 24 hr tablet Take 1 tablet (60 mg total) by mouth daily. 90 tablet 3  . metoprolol succinate (TOPROL-XL) 50 MG 24 hr tablet Take 1 tablet (50 mg total) by mouth 2 (two) times daily. Take with or immediately following a meal. 60 tablet 3  . nitroGLYCERIN (NITROSTAT) 0.4 MG SL tablet Place 1 tablet  (0.4 mg total) under the tongue every 5 (five) minutes as needed for chest pain. 25 tablet 12  . potassium chloride SA (K-DUR,KLOR-CON) 20 MEQ tablet Take 1 tablet (20 mEq total) by mouth daily. 30 tablet 3  . RAPAFLO 8 MG CAPS capsule Take 8 mg by mouth daily.     . simvastatin (ZOCOR) 40 MG tablet Take 40 mg by mouth daily.     . verapamil (CALAN-SR) 180 MG CR tablet TAKE 1 TABLET BY MOUTH EVERY NIGHT AT BEDTIME 30 tablet 3  . warfarin (COUMADIN) 1 MG tablet Take 1 mg by mouth daily. Take on Monday, Wednesday, and Friday.    . warfarin (COUMADIN) 2 MG tablet Take as directed by anticoagulation clinic (Patient taking differently: Take 2 mg by mouth daily. Take on Tuesday, Thursday, Saturday, and Sunday.) 40 tablet 1    Results for orders placed or performed during the hospital encounter of 04/10/14 (from the past 48 hour(s))  Urinalysis, Routine w reflex microscopic     Status: Abnormal   Collection Time: 04/10/14  2:43 PM  Result Value Ref Range   Color, Urine YELLOW YELLOW   APPearance CLEAR CLEAR   Specific Gravity, Urine 1.015 1.005 - 1.030   pH 6.0 5.0 - 8.0   Glucose, UA NEGATIVE NEGATIVE mg/dL   Hgb urine dipstick TRACE (A) NEGATIVE   Bilirubin Urine NEGATIVE NEGATIVE   Ketones, ur NEGATIVE NEGATIVE mg/dL   Protein, ur NEGATIVE NEGATIVE mg/dL   Urobilinogen, UA 2.0 (H) 0.0 - 1.0 mg/dL   Nitrite NEGATIVE NEGATIVE   Leukocytes, UA MODERATE (A) NEGATIVE  Urine microscopic-add on     Status: None   Collection Time: 04/10/14  2:43 PM  Result Value Ref Range   Squamous Epithelial / LPF RARE RARE   WBC, UA 7-10 <3 WBC/hpf   RBC / HPF 0-2 <3 RBC/hpf   Bacteria, UA RARE RARE  CBC with Differential     Status: Abnormal   Collection Time: 04/10/14  3:52 PM  Result Value Ref Range   WBC 9.6 4.0 - 10.5 K/uL   RBC 4.42 4.22 - 5.81 MIL/uL   Hemoglobin 13.5 13.0 - 17.0 g/dL   HCT 76.1 84.8 - 59.2 %   MCV 88.9 78.0 - 100.0 fL   MCH 30.5 26.0 - 34.0 pg   MCHC 34.4 30.0 - 36.0 g/dL    RDW 76.3 (H) 94.3 - 15.5 %   Platelets 210 150 - 400 K/uL   Neutrophils Relative % 53 43 - 77 %   Neutro Abs 5.2 1.7 - 7.7 K/uL   Lymphocytes Relative 34 12 - 46 %   Lymphs Abs 3.2 0.7 - 4.0 K/uL  Monocytes Relative 10 3 - 12 %   Monocytes Absolute 0.9 0.1 - 1.0 K/uL   Eosinophils Relative 2 0 - 5 %   Eosinophils Absolute 0.2 0.0 - 0.7 K/uL   Basophils Relative 1 0 - 1 %   Basophils Absolute 0.1 0.0 - 0.1 K/uL  Protime-INR     Status: Abnormal   Collection Time: 04/10/14  3:52 PM  Result Value Ref Range   Prothrombin Time 30.2 (H) 11.6 - 15.2 seconds   INR 2.86 (H) 0.00 - 1.49  Troponin I     Status: None   Collection Time: 04/10/14  3:52 PM  Result Value Ref Range   Troponin I <0.30 <0.30 ng/mL    Comment:        Due to the release kinetics of cTnI, a negative result within the first hours of the onset of symptoms does not rule out myocardial infarction with certainty. If myocardial infarction is still suspected, repeat the test at appropriate intervals.   Pro b natriuretic peptide     Status: Abnormal   Collection Time: 04/10/14  3:52 PM  Result Value Ref Range   Pro B Natriuretic peptide (BNP) 1873.0 (H) 0 - 125 pg/mL  Comprehensive metabolic panel     Status: Abnormal   Collection Time: 04/10/14  3:52 PM  Result Value Ref Range   Sodium 140 137 - 147 mEq/L   Potassium 3.7 3.7 - 5.3 mEq/L   Chloride 101 96 - 112 mEq/L   CO2 25 19 - 32 mEq/L   Glucose, Bld 95 70 - 99 mg/dL   BUN 6 6 - 23 mg/dL   Creatinine, Ser 0.93 0.50 - 1.35 mg/dL   Calcium 8.9 8.4 - 10.5 mg/dL   Total Protein 7.6 6.0 - 8.3 g/dL   Albumin 3.2 (L) 3.5 - 5.2 g/dL   AST 17 0 - 37 U/L   ALT 8 0 - 53 U/L   Alkaline Phosphatase 95 39 - 117 U/L   Total Bilirubin 0.7 0.3 - 1.2 mg/dL   GFR calc non Af Amer >90 >90 mL/min   GFR calc Af Amer >90 >90 mL/min    Comment: (NOTE) The eGFR has been calculated using the CKD EPI equation. This calculation has not been validated in all clinical  situations. eGFR's persistently <90 mL/min signify possible Chronic Kidney Disease.    Anion gap 14 5 - 15  Type and screen     Status: None   Collection Time: 04/10/14  3:52 PM  Result Value Ref Range   ABO/RH(D) O POS    Antibody Screen NEG    Sample Expiration 04/13/2014    Dg Chest 2 View  04/10/2014   CLINICAL DATA:  Pt. Is complaining of acute, intermittent, atraumatic confusion with associated dizziness, emesis and HAs onset two weeks ago. Pt reports that a couple of days ago he was unable to find his way home; pt denies any Hx of similar episodes. Pt reports he is on blood thinners and notes that he took a radiation pill for his overactive thyroid one week ago. Patient also with complaints of shortness of breath. History of coronary angioplasty with stent placement.  EXAM: CHEST  2 VIEW  COMPARISON:  01/21/2014.  FINDINGS: Cardiac silhouette is mildly enlarged. Normal mediastinal and hilar contours.  Clear lungs.  No pleural effusion or pneumothorax.  Bony thorax is unremarkable.  IMPRESSION: No acute cardiopulmonary disease.   Electronically Signed   By: Lajean Manes M.D.   On:  04/10/2014 15:52   Mr Brain Wo Contrast  04/10/2014   CLINICAL DATA:  Increased confusion with headaches and altered mental status for the past couple of weeks. No known head injury.  EXAM: MRI HEAD WITHOUT CONTRAST  TECHNIQUE: Multiplanar, multiecho pulse sequences of the brain and surrounding structures were obtained without intravenous contrast.  COMPARISON:  10/18/2010  FINDINGS: There is a new subdural hematoma extending along the right cerebral convexity which measures up to approximately 2.0 cm in thickness. The hematoma is heterogeneously hyperintense on T1 and T2 weighted images with internal complexity/ septations. There is partial effacement of the right lateral ventricle with 1.4 cm of leftward midline shaft. There is new, mild dilatation of the left lateral ventricle, most notably of the temporal and  occipital horns. Mild T2 hyperintensity surrounding the left temporal and occipital horns is consistent with transependymal CSF flow. There is no acute infarct or mass.  Orbits are unremarkable. Paranasal sinuses and mastoid air cells are clear. Major intracranial vascular flow voids are preserved.  IMPRESSION: Moderate-sized, subacute right-sided subdural hematoma. There is 1.4 cm of midline shift with trapping of the left lateral ventricle.  Critical Value/emergent results were called by telephone at the time of interpretation on 04/10/2014 at 3:37 pm to Dr. Davonna Belling , who verbally acknowledged these results.   Electronically Signed   By: Logan Bores   On: 04/10/2014 15:38    Pertinent items are noted in HPI.  Blood pressure 112/75, pulse 53, temperature 98.3 F (36.8 C), temperature source Oral, resp. rate 17, height 6' (1.829 m), SpO2 97 %.  the patient is awake and alert. He is oriented and mildly confused. Speech is fluent. Content is good. Cranial nerve function finds visual fields and visual acuities to be intact bilaterally. Extraocular movements are full. Pupils are equal at 4 mm bilaterally. Tongue protrudes midline. Palate elevates to midline. Hearing is normal bilaterally. Shoulder shrug equally. Motor examination reveals no evidence of pronator drift currently. Strength 5/5 bilaterally. Sensory examination nonfocal. Examination head ears eyes and throat demonstrates no evidence of trauma. No evidence of lesion within the oropharynx nasopharynx or external auditory canal. Chest and abdomen are benign except for some atrial fibrillation. Extremities are free from injury deformity. Assessment/Plan Subacute right sided subdural hematoma with significant mass effect. Situation, Katie by anticoagulant use. Plan to reverse anticoagulation and plan for surgery after cardiology clearance. Probable surgery Monday.  POOL,HENRY A 04/10/2014, 9:16 PM

## 2014-04-10 NOTE — ED Notes (Signed)
Patient would like something for a headache RN made aware.

## 2014-04-10 NOTE — Progress Notes (Signed)
Pt reports that 1 month ago he bumped the right side of his head while getting into the car.

## 2014-04-10 NOTE — ED Notes (Signed)
Pt's family reports pt has increased SOB with ambulation.

## 2014-04-11 ENCOUNTER — Inpatient Hospital Stay (HOSPITAL_COMMUNITY): Payer: PRIVATE HEALTH INSURANCE

## 2014-04-11 LAB — PROTIME-INR
INR: 1.43 (ref 0.00–1.49)
INR: 1.33 (ref 0.00–1.49)
INR: 1.32 (ref 0.00–1.49)
Prothrombin Time: 17.6 seconds — ABNORMAL HIGH (ref 11.6–15.2)
Prothrombin Time: 16.6 seconds — ABNORMAL HIGH (ref 11.6–15.2)
Prothrombin Time: 16.5 seconds — ABNORMAL HIGH (ref 11.6–15.2)

## 2014-04-11 MED ORDER — BOOST / RESOURCE BREEZE PO LIQD
1.0000 | Freq: Two times a day (BID) | ORAL | Status: DC
  Administered 2014-04-12 – 2014-04-17 (×8): 1 via ORAL

## 2014-04-11 MED ORDER — ATORVASTATIN CALCIUM 10 MG PO TABS
20.0000 mg | ORAL_TABLET | Freq: Every day | ORAL | Status: DC
  Administered 2014-04-11 – 2014-04-16 (×5): 20 mg via ORAL
  Filled 2014-04-11 (×6): qty 1
  Filled 2014-04-11: qty 2

## 2014-04-11 NOTE — Progress Notes (Signed)
Pt vomited moderate amount of liquid emesis.

## 2014-04-11 NOTE — Progress Notes (Signed)
INITIAL NUTRITION ASSESSMENT  DOCUMENTATION CODES Per approved criteria  -Obesity Unspecified   INTERVENTION: Resource Breeze po BID, each supplement provides 250 kcal and 9 grams of protein  NUTRITION DIAGNOSIS: Increased nutrient needs related to overactive thyroid as evidenced by weight loss.   Goal: Pt to meet >/= 90% of their estimated nutrition needs   Monitor:  Weight trend, po intake, acceptance of supplements  Reason for Assessment: MST  58 y.o. male  Admitting Dx: <principal problem not specified>  ASSESSMENT: 58 year old male with very significant cardiac disease on aspirin and Coumadin as anticoagulant presents with progressive headaches cognitive problems and unsteadiness of gait.   - Pt reports wt loss due to problems with overactive thyroid. He says that he has had a poor appetite. He drinks Boost supplements at home. Current diet is clear liquids. Pt agreed to continue nutritional supplements while in the hospital.  Height: Ht Readings from Last 1 Encounters:  04/10/14 6' (1.829 m)    Weight: Wt Readings from Last 1 Encounters:  04/10/14 265 lb (120.203 kg)    Ideal Body Weight: 77.6 kg  % Ideal Body Weight: 155%  Wt Readings from Last 10 Encounters:  04/10/14 265 lb (120.203 kg)  04/01/14 265 lb 12 oz (120.543 kg)  03/04/14 274 lb 12.8 oz (124.648 kg)  02/17/14 277 lb (125.646 kg)  02/16/14 280 lb 12.8 oz (127.37 kg)  02/10/14 289 lb 12.8 oz (131.452 kg)  02/01/14 303 lb (137.44 kg)  01/27/14 297 lb 9.9 oz (135 kg)  01/05/14 303 lb (137.44 kg)  01/01/14 303 lb 3.2 oz (137.531 kg)    BMI:  There is no weight on file to calculate BMI.  Estimated Nutritional Needs: Kcal: 2300-2500 Protein: 130-140 g Fluid: 2.3-2.5 L/day  Skin: intact  Diet Order: Diet clear liquid  EDUCATION NEEDS: -Education needs addressed   Intake/Output Summary (Last 24 hours) at 04/11/14 1150 Last data filed at 04/11/14 1000  Gross per 24 hour  Intake    725  ml  Output    650 ml  Net     75 ml    Last BM: prior to admission   Labs:   Recent Labs Lab 04/10/14 1552  NA 140  K 3.7  CL 101  CO2 25  BUN 6  CREATININE 0.93  CALCIUM 8.9  GLUCOSE 95    CBG (last 3)  No results for input(s): GLUCAP in the last 72 hours.  Scheduled Meds: . furosemide  60 mg Oral Daily  . isosorbide mononitrate  60 mg Oral Daily  . metoprolol succinate  50 mg Oral BID  . pantoprazole  80 mg Oral Q1200  . potassium chloride SA  20 mEq Oral Daily  . simvastatin  40 mg Oral q1800  . tamsulosin  0.4 mg Oral QPC supper  . verapamil  180 mg Oral Daily    Continuous Infusions:   Past Medical History  Diagnosis Date  . Hypertension     Severe LVH with normal EF  . Arteriosclerotic cardiovascular disease (ASCVD) 2005    catheterization in 10/2010:50% mid LAD, diffuse distal disease, circumflex irregularities, large dominant RCA with a 50% ostial, 70% distal, 60% posterolateral and 70% PDA; normal EF  . Cerebrovascular disease 2010    R. carotid endarterectomy; Duplex in 10/2010-widely patent ICAs, subtotal left vertebral-not thought to be contributing to symptoms  . Hyperlipidemia   . Obesity   . Tobacco abuse     Quit 2014  . Benign prostatic hypertrophy   .  Low back pain   . History of PSVT (paroxysmal supraventricular tachycardia) 2005    Diagnosed with ILR; no recurrence following RFA  . Cervical spine disease     CT in 2012-advanced degeneration and spondylosis with moderate spinal stenosis at C3-C6  . H/O: substance abuse     Cocaine, marijuana, alcohol.  Quit 2013.   [name] Syncope   . Gastroesophageal reflux disease   . Depression   . Erectile dysfunction   . Coronary artery disease   . Myocardial infarction   . Anginal pain   . Shortness of breath   . CHF (congestive heart failure)   . H/O hiatal hernia   . Headache(784.0)   . Arthritis   . Carotid artery occlusion   . Pericardial effusion without cardiac tamponade 12/03/2013  .  Diabetes mellitus type 2 in obese 12/03/2013  . Non-ST elevation myocardial infarction (NSTEMI), initial episode of care 12/02/2013    DES LAD  . Thyroid disease     Past Surgical History  Procedure Laterality Date  . Radiofrequency ablation  2005    for PSVT  . Coronary angioplasty with stent placement  12/03/2013    LAD 90%-->0% W/ Promus Premier DES 3.0 mm x 16 mm, CFX OK, RCA 40%, EF 70-75%  . Carotid endarterectomy Right Feb. 25, 2010     CEA    Laurette Schimke RD, LDN

## 2014-04-11 NOTE — Progress Notes (Signed)
Patient states that his headache is better. He is wide awake. Conversing appropriately. He had a number of episodes of emesis last night. Better this morning. Denies abdominal pain.  Afebrile. Vitals are stable. No cardiac events. Awake and alert. Oriented and reasonably appropriate. Motor and sensory exam intact. No pronator drift.  Right convexity subdural hematoma. Plan head CT scan today to better assess liquid/solid component of subdural for operative planning regarding possible bur hole versus craniotomy. Tentatively plan surgery Monday. Coumadin has been sufficiently reversed.

## 2014-04-12 LAB — PROTIME-INR
INR: 1.28 (ref 0.00–1.49)
Prothrombin Time: 16.2 seconds — ABNORMAL HIGH (ref 11.6–15.2)

## 2014-04-12 NOTE — Progress Notes (Signed)
Overall stable. Patient still with intermittent headache. No new complaints of weakness. No seizures.  Vital stable. Afebrile.  Awake and alert. Oriented and reasonably appropriate. Speech fluent. Motor 5/5 bilaterally with no evidence of significant pronator drift.  Follow-up head CT scan yesterday demonstrates chronic-appearing subdural hematoma along the right convexity with significant mass effect. CT scan appears that hematoma will be amenable to simple drainage and not require full craniotomy.  I discussed situation with the patient and his wife. He is now off the effects of both his Coumadin and his aspirin. I think we can move forward tomorrow with burr hole evacuation of his subdural hematoma on the right side. I discussed the risks and benefits involved with the procedure including but not limited to the risk of anesthesia, bleeding, infection, CSF leak, brain injury including stroke coma and/or death. Risk of seizures. Risk of reaccumulation and need for further surgery. Patient has wife appear to understand. They wish to proceed.

## 2014-04-13 ENCOUNTER — Inpatient Hospital Stay (HOSPITAL_COMMUNITY): Payer: PRIVATE HEALTH INSURANCE | Admitting: Anesthesiology

## 2014-04-13 ENCOUNTER — Encounter (HOSPITAL_COMMUNITY): Payer: Self-pay | Admitting: Anesthesiology

## 2014-04-13 ENCOUNTER — Encounter (HOSPITAL_COMMUNITY)
Admission: EM | Disposition: A | Discharge status: Home / Self Care | Payer: Self-pay | Source: Home / Self Care | Attending: Neurosurgery

## 2014-04-13 HISTORY — PX: BURR HOLE: SHX908

## 2014-04-13 LAB — CBC
HCT: 43.3 % (ref 39.0–52.0)
Hemoglobin: 14.8 g/dL (ref 13.0–17.0)
MCH: 31 pg (ref 26.0–34.0)
MCHC: 34.2 g/dL (ref 30.0–36.0)
MCV: 90.6 fL (ref 78.0–100.0)
Platelets: 208 10*3/uL (ref 150–400)
RBC: 4.78 MIL/uL (ref 4.22–5.81)
RDW: 16.1 % — ABNORMAL HIGH (ref 11.5–15.5)
WBC: 8.8 10*3/uL (ref 4.0–10.5)

## 2014-04-13 LAB — PROTIME-INR
INR: 1.23 (ref 0.00–1.49)
Prothrombin Time: 15.7 seconds — ABNORMAL HIGH (ref 11.6–15.2)

## 2014-04-13 LAB — GLUCOSE, CAPILLARY: Glucose-Capillary: 111 mg/dL — ABNORMAL HIGH (ref 70–99)

## 2014-04-13 SURGERY — CREATION, CRANIAL BURR HOLE
Anesthesia: General | Laterality: Right

## 2014-04-13 MED ORDER — OXYCODONE HCL 5 MG PO TABS: 5.0000 mg | ORAL_TABLET | Freq: Once | ORAL | Status: AC | PRN

## 2014-04-13 MED ORDER — HYDROMORPHONE HCL 1 MG/ML IJ SOLN
0.2500 mg | INTRAMUSCULAR | Status: DC | PRN
  Administered 2014-04-13 (×2): 0.5 mg via INTRAVENOUS

## 2014-04-13 MED ORDER — PROPOFOL 10 MG/ML IV BOLUS
INTRAVENOUS | Status: DC | PRN
  Administered 2014-04-13: 150 mg via INTRAVENOUS

## 2014-04-13 MED ORDER — SUCCINYLCHOLINE CHLORIDE 20 MG/ML IJ SOLN
INTRAMUSCULAR | Status: DC | PRN
  Administered 2014-04-13: 120 mg via INTRAVENOUS

## 2014-04-13 MED ORDER — SODIUM CHLORIDE 0.9 % IV SOLN
INTRAVENOUS | Status: DC | PRN
  Administered 2014-04-13 (×3): via INTRAVENOUS

## 2014-04-13 MED ORDER — 0.9 % SODIUM CHLORIDE (POUR BTL) OPTIME
TOPICAL | Status: DC | PRN
  Administered 2014-04-13 (×2): 1000 mL

## 2014-04-13 MED ORDER — HYDROMORPHONE HCL 1 MG/ML IJ SOLN
INTRAMUSCULAR | Status: AC
  Administered 2014-04-13: 19:00:00
  Filled 2014-04-13: qty 1

## 2014-04-13 MED ORDER — THROMBIN 20000 UNITS EX SOLR
CUTANEOUS | Status: DC | PRN
  Administered 2014-04-13: 18:00:00 via TOPICAL

## 2014-04-13 MED ORDER — FENTANYL CITRATE 0.05 MG/ML IJ SOLN
INTRAMUSCULAR | Status: AC
  Filled 2014-04-13: qty 5

## 2014-04-13 MED ORDER — ONDANSETRON HCL 4 MG/2ML IJ SOLN
INTRAMUSCULAR | Status: AC
  Filled 2014-04-13: qty 2

## 2014-04-13 MED ORDER — BACITRACIN 50000 UNITS IM SOLR
INTRAMUSCULAR | Status: DC | PRN
  Administered 2014-04-13: 18:00:00

## 2014-04-13 MED ORDER — OXYCODONE HCL 5 MG/5ML PO SOLN: 5.0000 mg | Freq: Once | ORAL | Status: AC | PRN

## 2014-04-13 MED ORDER — ONDANSETRON HCL 4 MG/2ML IJ SOLN
INTRAMUSCULAR | Status: DC | PRN
  Administered 2014-04-13: 4 mg via INTRAVENOUS

## 2014-04-13 MED ORDER — CEFAZOLIN SODIUM 1-5 GM-% IV SOLN
1.0000 g | Freq: Three times a day (TID) | INTRAVENOUS | Status: AC
  Administered 2014-04-13 – 2014-04-14 (×3): 1 g via INTRAVENOUS
  Filled 2014-04-13 (×3): qty 50

## 2014-04-13 MED ORDER — MIDAZOLAM HCL 2 MG/2ML IJ SOLN
INTRAMUSCULAR | Status: AC
  Filled 2014-04-13: qty 2

## 2014-04-13 MED ORDER — CEFAZOLIN SODIUM-DEXTROSE 2-3 GM-% IV SOLR
INTRAVENOUS | Status: AC
  Filled 2014-04-13: qty 50

## 2014-04-13 MED ORDER — DEXTROSE 5 % IV SOLN
10.0000 mg | INTRAVENOUS | Status: DC | PRN
  Administered 2014-04-13: 10 ug/min via INTRAVENOUS

## 2014-04-13 MED ORDER — SUCCINYLCHOLINE CHLORIDE 20 MG/ML IJ SOLN
INTRAMUSCULAR | Status: AC
  Filled 2014-04-13: qty 1

## 2014-04-13 MED ORDER — PROPOFOL 10 MG/ML IV BOLUS
INTRAVENOUS | Status: AC
  Filled 2014-04-13: qty 20

## 2014-04-13 MED ORDER — NEOSTIGMINE METHYLSULFATE 10 MG/10ML IV SOLN
INTRAVENOUS | Status: AC
  Filled 2014-04-13: qty 1

## 2014-04-13 MED ORDER — LIDOCAINE HCL (CARDIAC) 20 MG/ML IV SOLN
INTRAVENOUS | Status: AC
  Filled 2014-04-13: qty 5

## 2014-04-13 MED ORDER — FENTANYL CITRATE 0.05 MG/ML IJ SOLN
INTRAMUSCULAR | Status: DC | PRN
  Administered 2014-04-13 (×2): 50 ug via INTRAVENOUS
  Administered 2014-04-13: 75 ug via INTRAVENOUS
  Administered 2014-04-13: 50 ug via INTRAVENOUS
  Administered 2014-04-13: 25 ug via INTRAVENOUS

## 2014-04-13 MED ORDER — ARTIFICIAL TEARS OP OINT
TOPICAL_OINTMENT | OPHTHALMIC | Status: DC | PRN
  Administered 2014-04-13: 1 via OPHTHALMIC

## 2014-04-13 MED ORDER — ONDANSETRON HCL 4 MG/2ML IJ SOLN: 4.0000 mg | Freq: Once | INTRAMUSCULAR | Status: AC | PRN

## 2014-04-13 MED ORDER — LIDOCAINE HCL (CARDIAC) 20 MG/ML IV SOLN
INTRAVENOUS | Status: DC | PRN
  Administered 2014-04-13: 100 mg via INTRAVENOUS

## 2014-04-13 MED ORDER — CEFAZOLIN SODIUM-DEXTROSE 2-3 GM-% IV SOLR
INTRAVENOUS | Status: DC | PRN
  Administered 2014-04-13: 2 g via INTRAVENOUS

## 2014-04-13 MED ORDER — GLYCOPYRROLATE 0.2 MG/ML IJ SOLN
INTRAMUSCULAR | Status: AC
  Filled 2014-04-13: qty 2

## 2014-04-13 SURGICAL SUPPLY — 61 items
BAG DECANTER FOR FLEXI CONT (MISCELLANEOUS) ×2 IMPLANT
BANDAGE GAUZE 4  KLING STR (GAUZE/BANDAGES/DRESSINGS) IMPLANT
BLADE CLIPPER SURG (BLADE) IMPLANT
BLADE SURG 11 STRL SS (BLADE) ×2 IMPLANT
BNDG COHESIVE 4X5 TAN NS LF (GAUZE/BANDAGES/DRESSINGS) IMPLANT
BRUSH SCRUB EZ PLAIN DRY (MISCELLANEOUS) ×2 IMPLANT
BUR ACORN 6.0 (BURR) ×2 IMPLANT
BUR ADDG 1.1 (BURR) IMPLANT
CANISTER SUCT 3000ML (MISCELLANEOUS) ×2 IMPLANT
CLIP TI MEDIUM 6 (CLIP) IMPLANT
CONT SPEC 4OZ CLIKSEAL STRL BL (MISCELLANEOUS) ×2 IMPLANT
CORDS BIPOLAR (ELECTRODE) ×2 IMPLANT
DECANTER SPIKE VIAL GLASS SM (MISCELLANEOUS) ×2 IMPLANT
DRAIN CHANNEL 10M FLAT 3/4 FLT (DRAIN) ×1 IMPLANT
DRAPE NEUROLOGICAL W/INCISE (DRAPES) IMPLANT
DRAPE POUCH INSTRU U-SHP 10X18 (DRAPES) ×1 IMPLANT
DRAPE WARM FLUID 44X44 (DRAPE) ×2 IMPLANT
ELECT CAUTERY BLADE 6.4 (BLADE) ×2 IMPLANT
ELECT REM PT RETURN 9FT ADLT (ELECTROSURGICAL) ×2
ELECTRODE REM PT RTRN 9FT ADLT (ELECTROSURGICAL) ×1 IMPLANT
EVACUATOR SILICONE 100CC (DRAIN) ×1 IMPLANT
GAUZE SPONGE 4X4 12PLY STRL (GAUZE/BANDAGES/DRESSINGS) IMPLANT
GAUZE SPONGE 4X4 16PLY XRAY LF (GAUZE/BANDAGES/DRESSINGS) IMPLANT
GLOVE ECLIPSE 9.0 STRL (GLOVE) IMPLANT
GLOVE EXAM NITRILE LRG STRL (GLOVE) IMPLANT
GLOVE EXAM NITRILE MD LF STRL (GLOVE) IMPLANT
GLOVE EXAM NITRILE XL STR (GLOVE) IMPLANT
GLOVE EXAM NITRILE XS STR PU (GLOVE) IMPLANT
GOWN STRL REUS W/ TWL LRG LVL3 (GOWN DISPOSABLE) ×1 IMPLANT
GOWN STRL REUS W/ TWL XL LVL3 (GOWN DISPOSABLE) ×1 IMPLANT
GOWN STRL REUS W/TWL 2XL LVL3 (GOWN DISPOSABLE) ×2 IMPLANT
GOWN STRL REUS W/TWL LRG LVL3 (GOWN DISPOSABLE) ×2
GOWN STRL REUS W/TWL XL LVL3 (GOWN DISPOSABLE) ×2
HEMOSTAT SURGICEL 2X14 (HEMOSTASIS) IMPLANT
HOOK DURA (MISCELLANEOUS) ×2 IMPLANT
KIT BASIN OR (CUSTOM PROCEDURE TRAY) ×2 IMPLANT
KIT ROOM TURNOVER OR (KITS) ×2 IMPLANT
LIQUID BAND (GAUZE/BANDAGES/DRESSINGS) ×2 IMPLANT
NDL HYPO 25X1 1.5 SAFETY (NEEDLE) ×1 IMPLANT
NEEDLE HYPO 25X1 1.5 SAFETY (NEEDLE) ×2 IMPLANT
NS IRRIG 1000ML POUR BTL (IV SOLUTION) ×2 IMPLANT
PACK CRANIOTOMY (CUSTOM PROCEDURE TRAY) ×2 IMPLANT
PAD ARMBOARD 7.5X6 YLW CONV (MISCELLANEOUS) ×6 IMPLANT
PATTIES SURGICAL .25X.25 (GAUZE/BANDAGES/DRESSINGS) IMPLANT
PATTIES SURGICAL .5 X.5 (GAUZE/BANDAGES/DRESSINGS) IMPLANT
PATTIES SURGICAL .5 X3 (DISPOSABLE) IMPLANT
PATTIES SURGICAL 1X1 (DISPOSABLE) IMPLANT
PIN MAYFIELD SKULL DISP (PIN) IMPLANT
SPONGE NEURO XRAY DETECT 1X3 (DISPOSABLE) IMPLANT
SPONGE SURGIFOAM ABS GEL 100 (HEMOSTASIS) IMPLANT
STAPLER VISISTAT 35W (STAPLE) ×2 IMPLANT
STOCKINETTE 4X48 STRL (DRAPES) ×1 IMPLANT
SUT NURALON 4 0 TR CR/8 (SUTURE) ×4 IMPLANT
SUT VIC AB 2-0 CT1 18 (SUTURE) ×2 IMPLANT
SYR 20ML ECCENTRIC (SYRINGE) ×2 IMPLANT
SYR BULB 3OZ (MISCELLANEOUS) ×2 IMPLANT
SYR CONTROL 10ML LL (SYRINGE) ×2 IMPLANT
TOWEL OR 17X24 6PK STRL BLUE (TOWEL DISPOSABLE) ×2 IMPLANT
TOWEL OR 17X26 10 PK STRL BLUE (TOWEL DISPOSABLE) ×2 IMPLANT
TRAY FOLEY CATH 14FRSI W/METER (CATHETERS) IMPLANT
WATER STERILE IRR 1000ML POUR (IV SOLUTION) ×2 IMPLANT

## 2014-04-13 NOTE — Anesthesia Preprocedure Evaluation (Addendum)
Anesthesia Evaluation    Reviewed: Allergy & Precautions, H&P , NPO status , Patient's Chart, lab work & pertinent test results  Airway        Dental   Pulmonary shortness of breath, sleep apnea , former smoker,          Cardiovascular hypertension, + angina + CAD, + Past MI, + Cardiac Stents, + Peripheral Vascular Disease and +CHF Rhythm:Irregular Rate:Normal  Has HOCM, well outlined in chart MI in June, CAD steneted   Neuro/Psych Cervical spine stenosis  Neuromuscular disease    GI/Hepatic hiatal hernia, GERD-  ,  Endo/Other  diabetesMorbid obesity  Renal/GU      Musculoskeletal  (+) Arthritis -,   Abdominal   Peds  Hematology   Anesthesia Other Findings   Reproductive/Obstetrics                            Anesthesia Physical Anesthesia Plan  ASA: IV  Anesthesia Plan: General   Post-op Pain Management:    Induction: Intravenous  Airway Management Planned: Oral ETT  Additional Equipment: Arterial line  Intra-op Plan:   Post-operative Plan: Extubation in OR  Informed Consent: I have reviewed the patients History and Physical, chart, labs and discussed the procedure including the risks, benefits and alternatives for the proposed anesthesia with the patient or authorized representative who has indicated his/her understanding and acceptance.   Dental advisory given  Plan Discussed with: CRNA and Surgeon  Anesthesia Plan Comments:        Anesthesia Quick Evaluation

## 2014-04-13 NOTE — Brief Op Note (Signed)
04/10/2014 - 04/13/2014  6:21 PM  PATIENT:  Peter Dunlap  58 y.o. male  PRE-OPERATIVE DIAGNOSIS:  subdural hematoma  POST-OPERATIVE DIAGNOSIS:  subdural hematoma  PROCEDURE:  Procedure(s): BURR HOLES (Right)  SURGEON:  Surgeon(s) and Role:    * Charlie Pitter, MD - Primary  PHYSICIAN ASSISTANT:   ASSISTANTS: none   ANESTHESIA:   general  EBL:  Total I/O In: 1000 [I.V.:1000] Out: 800 [Urine:800]  BLOOD ADMINISTERED:none  DRAINS: (10 mm) Blake drain(s) in the subdural space   LOCAL MEDICATIONS USED:  NONE  SPECIMEN:  No Specimen  DISPOSITION OF SPECIMEN:  N/A  COUNTS:  YES  TOURNIQUET:  * No tourniquets in log *  DICTATION: .Dragon Dictation  PLAN OF CARE: Admit to inpatient   PATIENT DISPOSITION:  PACU - hemodynamically stable.   Delay start of Pharmacological VTE agent (>24hrs) due to surgical blood loss or risk of bleeding: yes

## 2014-04-13 NOTE — Transfer of Care (Signed)
Immediate Anesthesia Transfer of Care Note  Patient: Peter Dunlap  Procedure(s) Performed: Procedure(s): BURR HOLES (Right)  Patient Location: PACU  Anesthesia Type:General  Level of Consciousness: awake, alert  and patient cooperative  Airway & Oxygen Therapy: Patient Spontanous Breathing and Patient connected to nasal cannula oxygen  Post-op Assessment: Report given to PACU RN and Post -op Vital signs reviewed and stable  Post vital signs: Reviewed and stable  Complications: No apparent anesthesia complications

## 2014-04-13 NOTE — Anesthesia Postprocedure Evaluation (Signed)
  Anesthesia Post-op Note  Patient: Peter Dunlap  Procedure(s) Performed: Procedure(s): BURR HOLES (Right)  Patient Location: PACU  Anesthesia Type: General   Level of Consciousness: awake, alert  and oriented  Airway and Oxygen Therapy: Patient Spontanous Breathing  Post-op Pain: mild  Post-op Assessment: Post-op Vital signs reviewed  Post-op Vital Signs: Reviewed  Last Vitals:  Filed Vitals:   04/13/14 1903  BP: 150/53  Pulse: 77  Temp:   Resp: 24    Complications: No apparent anesthesia complications

## 2014-04-13 NOTE — Op Note (Signed)
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  Asst. Surgeon: none  Anesthesia: General  Indication:58 year old male with right convexity subdural hematoma with significant mass effect. Patient presents now for burr hole evacuation.  Operative note:after induction of anesthesia. Patient positioned supine with right shoulder elevated head turned toward the left. Patient's right scalp prepped and draped sterilely. Incisions made in his right frontal and right parietal region area 2 burr hole was were made using high-speed drill. Dura was quite related and incised in a cruciate fashion. Subdural hematoma which was liquefied in the color of old motor oil drained under pressure. 2 separate subdural membranes were encountered. These were quite related and opened and further fluid was drained. A subdural drain was then passed from the parietal burr hole to the frontal bur hole under direct visualization. This is exited through a separate stab incision. Wound is then irrigated one final time. Wounds were then closed in typical fashion. A sterile dressing was applied. No apparent complications. Patient returns to the recovery room postop.

## 2014-04-13 NOTE — Plan of Care (Signed)
Problem: Consults Goal: Craniotomy Patient Education See Patient Education Module for education specifics.  Outcome: Completed/Met Date Met:  04/13/14 Goal: Diagnosis - Craniotomy Subdural hematoma Goal: Skin Care Protocol Initiated - if Braden Score 18 or less If consults are not indicated, leave blank or document N/A  Outcome: Not Applicable Date Met:  20/10/07 Goal: Nutrition Consult-if indicated Outcome: Not Applicable Date Met:  05/23/74 Goal: Diabetes Guidelines if Diabetic/Glucose > 140 If diabetic or lab glucose is > 140 mg/dl - Initiate Diabetes/Hyperglycemia Guidelines & Document Interventions  Outcome: Not Applicable Date Met:  88/32/54

## 2014-04-13 NOTE — Anesthesia Procedure Notes (Signed)
Procedure Name: Intubation Date/Time: 04/13/2014 5:24 PM Performed by: Ned Grace Pre-anesthesia Checklist: Patient identified, Timeout performed, Emergency Drugs available, Suction available and Patient being monitored Patient Re-evaluated:Patient Re-evaluated prior to inductionOxygen Delivery Method: Circle system utilized Preoxygenation: Pre-oxygenation with 100% oxygen Intubation Type: IV induction Ventilation: Mask ventilation without difficulty Laryngoscope Size: Mac and 4 Grade View: Grade I Tube type: Oral Tube size: 7.5 mm Number of attempts: 1 Airway Equipment and Method: Stylet Placement Confirmation: ETT inserted through vocal cords under direct vision,  breath sounds checked- equal and bilateral and positive ETCO2 Tube secured with: Tape Dental Injury: Teeth and Oropharynx as per pre-operative assessment

## 2014-04-13 NOTE — Plan of Care (Signed)
Problem: Phase I Progression Outcomes Goal: Neuro exam at baseline or improved Outcome: Progressing Goal: Respiratory status stable Outcome: Completed/Met Date Met:  04/13/14 Goal: Hemodynamically stable Outcome: Completed/Met Date Met:  04/13/14 Goal: Pain controlled with appropriate interventions Outcome: Progressing Goal: Bedrest HOB at 30 degrees Outcome: Progressing

## 2014-04-13 NOTE — Progress Notes (Signed)
UR completed.  Michelle Bryson, RN BSN MHA CCM Trauma/Neuro ICU Case Manager [phone]  

## 2014-04-14 ENCOUNTER — Encounter (HOSPITAL_COMMUNITY): Payer: Self-pay | Admitting: Neurosurgery

## 2014-04-14 ENCOUNTER — Inpatient Hospital Stay (HOSPITAL_COMMUNITY): Payer: PRIVATE HEALTH INSURANCE

## 2014-04-14 ENCOUNTER — Encounter (HOSPITAL_COMMUNITY): Payer: PRIVATE HEALTH INSURANCE

## 2014-04-14 MED ORDER — PHENOL 1.4 % MT LIQD
1.0000 | OROMUCOSAL | Status: DC | PRN
  Administered 2014-04-14 (×2): 1 via OROMUCOSAL
  Filled 2014-04-14 (×2): qty 177

## 2014-04-14 NOTE — Plan of Care (Signed)
Problem: Phase I Progression Outcomes Goal: Pain controlled with appropriate interventions Outcome: Completed/Met Date Met:  04/14/14 Goal: Bedrest HOB at 30 degrees Outcome: Completed/Met Date Met:  04/14/14 Goal: Voiding-avoid urinary catheter unless indicated Outcome: Progressing Pt had to be I&O cath'd 3x since surgery but has voided once since being catheterized.  Goal: Initial discharge plan identified Outcome: Completed/Met Date Met:  04/14/14

## 2014-04-14 NOTE — Clinical Documentation Improvement (Signed)
Supporting Information: AMS noted per 11/06 progress notes.   . Document the etiology of the altered mental status as: --Coma --Confusion/delirium (including drug-induced) --Drowsiness/somnolence --Stupor/semi-coma --Transient alteration of awareness  --Encephalopathy Alcoholic Anoxic/hypoxic Drug-induced/toxic (specify drug) Hepatic Hypertensive Hypoglycemic Metabolic/septic Traumatic/post-concussion Wernicke Other (specify) . Document any associated diagnoses/conditions     Thank Sherian Maroon Documentation Specialist [phone] [email or web address]

## 2014-04-14 NOTE — Clinical Documentation Improvement (Signed)
Supporting Information: CHF noted per 11/06 progress notes.  Labs: 11/06: proBNP: 1873.0.    Possible Diagnosis:  . Document acuity --Acute --Chronic --Acute on Chronic  . Document type --Diastolic --Systolic --Combined systolic and diastolic  . Due to or associated with --Cardiac or other surgery --Hypertension --Valvular disease --Rheumatic heart disease Endocarditis (valvitis) Pericarditis Myocarditis --Other (specify)    Thank Sherian Maroon Documentation Specialist [phone] [email or web address]

## 2014-04-14 NOTE — Progress Notes (Signed)
Postop day 1. Overall doing well. Minimal headache. No weakness. No seizures. No other complaints. Patient with some mild urinary retention postop.  Afebrile. Vital stable. Awake and alert. Oriented and appropriate. Motor and sensory function intact. Wound clean and dry. Drain output moderate. Follow-up head CT scan reveals improved appearance after burr hole evacuation. Still with some residual fluid. Continue drain for 2 days. Rescan on Thursday.

## 2014-04-14 NOTE — Progress Notes (Signed)
Pt had urge to void and attempted to void both in urinal and in bathroom but was unable to start flow. Pt currently on flomax. Bladder scan indicated 310 cc. I&O cath'd per prn order and returned 300cc urine. Hematuria noted, was also present PTA. Pt reported relief. Will continue to monitor and encourage voiding without use of catheterization if possible.   Latrelle Dodrill

## 2014-04-15 ENCOUNTER — Telehealth: Payer: Self-pay | Admitting: *Deleted

## 2014-04-15 MED ORDER — ESOMEPRAZOLE MAGNESIUM 40 MG PO CPDR: 40.0000 mg | DELAYED_RELEASE_CAPSULE | Freq: Every day | ORAL | Status: DC

## 2014-04-15 MED ORDER — VERAPAMIL HCL ER 180 MG PO TBCR
90.0000 mg | EXTENDED_RELEASE_TABLET | Freq: Every day | ORAL | Status: DC
  Administered 2014-04-15 – 2014-04-17 (×3): 90 mg via ORAL
  Filled 2014-04-15 (×2): qty 0.5

## 2014-04-15 MED ORDER — METOPROLOL SUCCINATE ER 25 MG PO TB24
25.0000 mg | ORAL_TABLET | Freq: Two times a day (BID) | ORAL | Status: DC
  Administered 2014-04-15 – 2014-04-17 (×5): 25 mg via ORAL
  Filled 2014-04-15 (×5): qty 1

## 2014-04-15 NOTE — Progress Notes (Signed)
Postop day 2. Overall doing well. Minimal headache. States that he feels much better. Wife says that he is acting more like himself.  Afebrile. Vital stable. Awake and alert. Oriented and appropriate. Motor and sensory function intact. Wound clean and dry. Drain output175 over last 24 hours.  Overall doing well. Plan follow-up head CT scan in morning. If this looks good then pulled drain and mobilize with tentative discharge plan for Friday.

## 2014-04-15 NOTE — Telephone Encounter (Signed)
Received fax requesting to change Nexium to generic esomeprazole.   MD made aware and orders approved.   Prescription sent to pharmacy.

## 2014-04-15 NOTE — Progress Notes (Addendum)
Dr. Annette Stable made aware of pt's HR sustaining 40s to 50s, BP 122/51. Dr. Annette Stable to notify Dr. Lovena Le. Will hold pt's Metoprolol and Verapamil at this time and await cardiology input.   Update: dosages decreased by 50% and ok to continue.   Peter Dunlap

## 2014-04-15 NOTE — Plan of Care (Signed)
Problem: Phase II Progression Outcomes Goal: Neuro exam at baseline or improved Outcome: Completed/Met Date Met:  04/15/14 Goal: Extubated and maintains O2 sats > 92% Outcome: Completed/Met Date Met:  04/15/14 Goal: Hemodynamically stable Outcome: Completed/Met Date Met:  04/15/14 Goal: Pain controlled Outcome: Completed/Met Date Met:  04/15/14 Goal: Progress activity as tolerated unless otherwise ordered Outcome: Progressing Goal: Discharge plan established Outcome: Progressing Goal: Tolerating diet Outcome: Completed/Met Date Met:  04/15/14

## 2014-04-16 ENCOUNTER — Inpatient Hospital Stay (HOSPITAL_COMMUNITY): Payer: PRIVATE HEALTH INSURANCE

## 2014-04-16 NOTE — Progress Notes (Signed)
Pt arrived in 4N26 from [place]. Pt oriented to room and equipment. Vital signs stable. Pt in no acute distress at this time. Will continue to monitor.

## 2014-04-16 NOTE — Progress Notes (Signed)
Overall doing well. No headaches. Complains of some right knee discomfort but otherwise without complaints.  Afebrile. Vital stable. Drain output minimal. Awake and alert. Oriented and appropriate. Motor and sensory function intact.  Follow-up head CT scan demonstrates continued resolution of subdural fluid collection. Still with mild mass effect.  Overall doing well. I pulled the subdural drain today. We will mobilize. Transfer to floor. Probable discharge home tomorrow.

## 2014-04-17 MED ORDER — TRAMADOL HCL 50 MG PO TABS: 50.0000 mg | ORAL_TABLET | Freq: Four times a day (QID) | ORAL | Status: DC | PRN

## 2014-04-17 MED ORDER — CLOPIDOGREL BISULFATE 75 MG PO TABS: 75.0000 mg | ORAL_TABLET | Freq: Every day | ORAL | Status: DC

## 2014-04-17 NOTE — Discharge Instructions (Signed)
Wound Care Keep incision dry.   Do not put any creams, lotions, or ointments on incision.  Activity Walk each and every day, increasing distance each day. No lifting greater than 5 lbs.  Avoid excessive head motion.  Diet Resume your normal diet.   Call Your Doctor If Any of These Occur Headaches that will not go away  Redness, drainage, or swelling at the wound.  Temperature greater than 101 degrees. Severe pain not relieved by pain medication. Incision starts to come apart. Follow Up Appt Call today for appointment in 1-2 weeks ([phone]) or for problems.  If you have any hardware placed in your spine, you will need an x-ray before your appointment.

## 2014-04-17 NOTE — Discharge Summary (Signed)
Physician Discharge Summary  Patient ID: Peter Dunlap MRN: [id number] DOB/AGE: 1956/05/10 58 y.o.  Admit date: 04/10/2014 Discharge date: 04/17/2014  Admission Diagnoses:  Discharge Diagnoses:  Active Problems:   Subdural hematoma   Discharged Condition: good  Hospital Course: the patient was admitted to the hospital for treatment of a large chronic right-sided subdural hematoma. The patient's situation was complicated by aspirin and Coumadin use. His Coumadin was reversed. The aspirin was allowed to wear off and the patient was taken to the operating room where he underwent right-sided burr hole evacuation of subdural hematoma. Postoperatively he is done very well. Headaches are much improved. He is up ambulating without difficulty. His family feels that he is performing at his baseline level. Key scans demonstrated small residual subdural hematoma but much less mass effect. This should resolve over time plan for discharge home. Patient will remain off Coumadin and off aspirin. He will resume Plavix as per Dr. Tanna Furry instruction. He will follow up with me in one week.  Consults:   Significant Diagnostic Studies:   Treatments:   Discharge Exam: Blood pressure 105/88, pulse 71, temperature 97.9 F (36.6 C), temperature source Axillary, resp. rate 18, height 6' (1.829 m), weight 120.2 kg (264 lb 15.9 oz), SpO2 100 %. Awake and alert. Oriented and appropriate. Motor and sensory function intact. Wound clean and dry. Chest and abdomen benign.  Disposition: 01-Home or Self Care     Medication List    STOP taking these medications        aspirin EC 81 MG tablet     warfarin 1 MG tablet  Commonly known as:  COUMADIN     warfarin 2 MG tablet  Commonly known as:  COUMADIN      TAKE these medications        acetaminophen 500 MG tablet  Commonly known as:  TYLENOL  Take 500 mg by mouth every 6 (six) hours as needed for moderate pain.     clopidogrel 75 MG tablet   Commonly known as:  PLAVIX  Take 1 tablet (75 mg total) by mouth daily.     colchicine 0.6 MG tablet  Take 1 tablet (0.6 mg total) by mouth 2 (two) times daily as needed.     esomeprazole 40 MG capsule  Commonly known as:  NEXIUM  Take 1 capsule (40 mg total) by mouth daily at 12 noon.     furosemide 20 MG tablet  Commonly known as:  LASIX  Take 3 tablets (60 mg total) by mouth daily.     isosorbide mononitrate 60 MG 24 hr tablet  Commonly known as:  IMDUR  Take 1 tablet (60 mg total) by mouth daily.     metoprolol succinate 50 MG 24 hr tablet  Commonly known as:  TOPROL-XL  Take 1 tablet (50 mg total) by mouth 2 (two) times daily. Take with or immediately following a meal.     nitroGLYCERIN 0.4 MG SL tablet  Commonly known as:  NITROSTAT  Place 1 tablet (0.4 mg total) under the tongue every 5 (five) minutes as needed for chest pain.     potassium chloride SA 20 MEQ tablet  Commonly known as:  K-DUR,KLOR-CON  Take 1 tablet (20 mEq total) by mouth daily.     RAPAFLO 8 MG Caps capsule  Generic drug:  silodosin  Take 8 mg by mouth daily.     simvastatin 40 MG tablet  Commonly known as:  ZOCOR  Take 40 mg by mouth daily.  traMADol 50 MG tablet  Commonly known as:  ULTRAM  Take 1-2 tablets (50-100 mg total) by mouth every 6 (six) hours as needed for moderate pain.     verapamil 180 MG CR tablet  Commonly known as:  CALAN-SR  TAKE 1 TABLET BY MOUTH EVERY NIGHT AT BEDTIME           Follow-up Information    Follow up with Charlie Pitter, MD.   Specialty:  Neurosurgery   Contact information:   [address] [place] [address] [phone]       Signed: POOL,HENRY A 04/17/2014, 9:43 AM

## 2014-04-17 NOTE — Plan of Care (Signed)
Problem: Consults Goal: Diagnosis - Craniotomy Outcome: Completed/Met Date Met:  04/17/14 Subdural hematoma

## 2014-04-17 NOTE — Progress Notes (Signed)
Patient alert and oriented, voiding adequate amount of urine, maex4 and no c/o pain. Patient discharged home with family. Script and discharged instructions given to patient. Patient and spouse stated understanding of instructions given. Patient has follow-up appointment with MD. Tomma Rakers RN

## 2014-04-20 ENCOUNTER — Ambulatory Visit: Payer: 59 | Admitting: Family Medicine

## 2014-04-21 ENCOUNTER — Ambulatory Visit (INDEPENDENT_AMBULATORY_CARE_PROVIDER_SITE_OTHER): Payer: 59 | Admitting: Family Medicine

## 2014-04-21 ENCOUNTER — Encounter: Payer: Self-pay | Admitting: Family Medicine

## 2014-04-21 VITALS — BP 128/74 | HR 68 | Temp 97.8°F | Resp 16 | Ht 72.0 in | Wt 259.0 lb

## 2014-04-21 DIAGNOSIS — S065XAA Traumatic subdural hemorrhage with loss of consciousness status unknown, initial encounter: Secondary | ICD-10-CM

## 2014-04-21 DIAGNOSIS — I209 Angina pectoris, unspecified: Secondary | ICD-10-CM

## 2014-04-21 DIAGNOSIS — G4733 Obstructive sleep apnea (adult) (pediatric): Secondary | ICD-10-CM

## 2014-04-21 DIAGNOSIS — S065X9A Traumatic subdural hemorrhage with loss of consciousness of unspecified duration, initial encounter: Secondary | ICD-10-CM

## 2014-04-21 DIAGNOSIS — E1169 Type 2 diabetes mellitus with other specified complication: Secondary | ICD-10-CM

## 2014-04-21 DIAGNOSIS — M10071 Idiopathic gout, right ankle and foot: Secondary | ICD-10-CM

## 2014-04-21 DIAGNOSIS — E0590 Thyrotoxicosis, unspecified without thyrotoxic crisis or storm: Secondary | ICD-10-CM

## 2014-04-21 DIAGNOSIS — E669 Obesity, unspecified: Secondary | ICD-10-CM

## 2014-04-21 DIAGNOSIS — R0600 Dyspnea, unspecified: Secondary | ICD-10-CM

## 2014-04-21 DIAGNOSIS — R634 Abnormal weight loss: Secondary | ICD-10-CM

## 2014-04-21 DIAGNOSIS — I6200 Nontraumatic subdural hemorrhage, unspecified: Secondary | ICD-10-CM

## 2014-04-21 DIAGNOSIS — E119 Type 2 diabetes mellitus without complications: Secondary | ICD-10-CM

## 2014-04-21 LAB — HEMOGLOBIN A1C, FINGERSTICK: Hgb A1C (fingerstick): 4.8 % (ref <5.7)

## 2014-04-21 MED ORDER — HYDROCODONE-ACETAMINOPHEN 5-325 MG PO TABS: 1.0000 | ORAL_TABLET | Freq: Four times a day (QID) | ORAL | Status: DC | PRN

## 2014-04-21 MED ORDER — COLCHICINE 0.6 MG PO TABS: 0.6000 mg | ORAL_TABLET | Freq: Two times a day (BID) | ORAL | Status: DC | PRN

## 2014-04-21 NOTE — Assessment & Plan Note (Signed)
He is getting treatment for his sleep apnea he would benefit from CPaP

## 2014-04-21 NOTE — Assessment & Plan Note (Addendum)
His A1c in the office today is completely normal at 4.8% is seems that he may have been borderline in the past isn't actually being diabetic he was never treated with any medications. At this point with his significant weight loss he is no longer even borderline diabetic

## 2014-04-21 NOTE — Patient Instructions (Signed)
Continue current medications We will call with results for diabetes test F/U 3 months

## 2014-04-21 NOTE — Progress Notes (Signed)
Patient ID: Peter Dunlap, male   DOB: 10-14-1955, 58 y.o.   MRN: [id number]   Subjective:    Patient ID: Peter Dunlap, male    DOB: Jul 04, 1955, 58 y.o.   MRN: [id number]  Patient presents for 2 month F/U  pt here for f/u since our last visit, he has had radio active iodine to ablate his thyroid due to hyperthyroidism he has follow-up with endocrinology in a couple weeks.  He is currently on C Pap for obstructive sleep apnea and is doing well with this. He will need his mask changed to an nasal mask secondary to the sutures in his head from his recent brain surgery.  He was found to have altered mental status with some gait instability he was seen in the ER which showed a chronic subdural hematoma due to his Coumadin and aspirin is unclear when he had an injury to his head. He was transferred to [facility], where he had bur holes made to drain the hematoma and he is doing quite well since then. He does have pain of the incision and the little pain in his right eye but no change in his vision no swelling of the eye. He has been given tramadol to use for pain but this is not helping.  Right knee pain he has had knee pain on and off for the past couple years he states that his right knee will often give out on him. He has not had this evaluated.  Diabetes mellitus his chart states diabetes mellitus type 2 he does not remember ever being on any medications for this but states he has a strong family history of diabetes and he was given insulin while he was in the hospital. His last A1c was 6.2% but he has lost significant weight due to his medical problems as well as his hyperthyroidism    Review Of Systems:  GEN- denies fatigue, fever, weight loss,weakness, recent illness HEENT- denies eye drainage, change in vision, nasal discharge, CVS- denies chest pain, palpitations RESP- denies SOB, cough, wheeze ABD- denies N/V, change in stools, abd pain GU- denies dysuria, hematuria, dribbling,  incontinence MSK- + joint pain, muscle aches, injury Neuro- denies headache, dizziness, syncope, seizure activity       Objective:    BP 128/74 mmHg  Pulse 68  Temp(Src) 97.8 F (36.6 C) (Oral)  Resp 16  Ht 6' (1.829 m)  Wt 259 lb (117.482 kg)  BMI 35.12 kg/m2 GEN- NAD, alert and oriented x3 HEENT- PERRL, EOMI, non injected sclera, pink conjunctiva, MMM, oropharynx clear Bilat TM and canals clear, no swelling of lids, no proptosis,  Scalp- incision d/C/I  Neck- Supple, CVS- Irregular rhythem, normal rate , no murmur RESP-CTAB MSK- Bilat knee, no effusion, crepitus bilat, Right knee- TTP infeior and medial aspect, no erythema, ligaments in tact EXT- No edema Pulses- Radial, DP- 2+        Assessment & Plan:      Problem List Items Addressed This Visit    Diabetes mellitus type 2 in obese - Primary (Chronic)    Check fingerstick A1C today      Relevant Orders      Hemoglobin A1C, fingerstick      Note: This dictation was prepared with Dragon dictation along with smaller phrase technology. Any transcriptional errors that result from this process are unintentional.

## 2014-04-21 NOTE — Assessment & Plan Note (Signed)
Followed by endocrine, no meds currently, s/p RAI

## 2014-04-21 NOTE — Assessment & Plan Note (Signed)
S/p surgery, pain uncontrolled with Ultram, switch to Norco 5-325mg  He understands red flags to seek care Defer staples to surgeon

## 2014-05-08 ENCOUNTER — Encounter (HOSPITAL_COMMUNITY): Payer: PRIVATE HEALTH INSURANCE

## 2014-05-14 ENCOUNTER — Encounter (HOSPITAL_COMMUNITY): Payer: Self-pay | Admitting: Cardiology

## 2014-05-19 ENCOUNTER — Emergency Department (HOSPITAL_COMMUNITY)
Admission: EM | Admit: 2014-05-19 | Discharge: 2014-05-19 | Disposition: A | Discharge status: Home / Self Care | Payer: PRIVATE HEALTH INSURANCE | Attending: Emergency Medicine | Admitting: Emergency Medicine

## 2014-05-19 ENCOUNTER — Emergency Department (HOSPITAL_COMMUNITY): Payer: PRIVATE HEALTH INSURANCE

## 2014-05-19 ENCOUNTER — Encounter (HOSPITAL_COMMUNITY): Payer: Self-pay | Admitting: Emergency Medicine

## 2014-05-19 DIAGNOSIS — R51 Headache: Secondary | ICD-10-CM | POA: Diagnosis present

## 2014-05-19 DIAGNOSIS — F329 Major depressive disorder, single episode, unspecified: Secondary | ICD-10-CM | POA: Diagnosis not present

## 2014-05-19 DIAGNOSIS — I509 Heart failure, unspecified: Secondary | ICD-10-CM | POA: Diagnosis not present

## 2014-05-19 DIAGNOSIS — Z87891 Personal history of nicotine dependence: Secondary | ICD-10-CM | POA: Diagnosis not present

## 2014-05-19 DIAGNOSIS — I252 Old myocardial infarction: Secondary | ICD-10-CM | POA: Diagnosis not present

## 2014-05-19 DIAGNOSIS — M1990 Unspecified osteoarthritis, unspecified site: Secondary | ICD-10-CM | POA: Insufficient documentation

## 2014-05-19 DIAGNOSIS — K219 Gastro-esophageal reflux disease without esophagitis: Secondary | ICD-10-CM | POA: Insufficient documentation

## 2014-05-19 DIAGNOSIS — E669 Obesity, unspecified: Secondary | ICD-10-CM | POA: Insufficient documentation

## 2014-05-19 DIAGNOSIS — I2510 Atherosclerotic heart disease of native coronary artery without angina pectoris: Secondary | ICD-10-CM | POA: Insufficient documentation

## 2014-05-19 DIAGNOSIS — G8918 Other acute postprocedural pain: Secondary | ICD-10-CM | POA: Insufficient documentation

## 2014-05-19 DIAGNOSIS — Z7901 Long term (current) use of anticoagulants: Secondary | ICD-10-CM | POA: Insufficient documentation

## 2014-05-19 DIAGNOSIS — S065X9A Traumatic subdural hemorrhage with loss of consciousness of unspecified duration, initial encounter: Secondary | ICD-10-CM

## 2014-05-19 DIAGNOSIS — Z79899 Other long term (current) drug therapy: Secondary | ICD-10-CM | POA: Diagnosis not present

## 2014-05-19 DIAGNOSIS — S065XAA Traumatic subdural hemorrhage with loss of consciousness status unknown, initial encounter: Secondary | ICD-10-CM

## 2014-05-19 DIAGNOSIS — I214 Non-ST elevation (NSTEMI) myocardial infarction: Secondary | ICD-10-CM | POA: Diagnosis not present

## 2014-05-19 DIAGNOSIS — E785 Hyperlipidemia, unspecified: Secondary | ICD-10-CM | POA: Insufficient documentation

## 2014-05-19 DIAGNOSIS — I10 Essential (primary) hypertension: Secondary | ICD-10-CM | POA: Diagnosis not present

## 2014-05-19 DIAGNOSIS — R519 Headache, unspecified: Secondary | ICD-10-CM

## 2014-05-19 LAB — COMPREHENSIVE METABOLIC PANEL
ALT: 10 U/L (ref 0–53)
AST: 20 U/L (ref 0–37)
Albumin: 3.4 g/dL — ABNORMAL LOW (ref 3.5–5.2)
Alkaline Phosphatase: 86 U/L (ref 39–117)
Anion gap: 12 (ref 5–15)
BUN: 6 mg/dL (ref 6–23)
CO2: 22 mEq/L (ref 19–32)
Calcium: 9.2 mg/dL (ref 8.4–10.5)
Chloride: 105 mEq/L (ref 96–112)
Creatinine, Ser: 0.82 mg/dL (ref 0.50–1.35)
GFR calc Af Amer: >90 mL/min (ref >90)
GFR calc non Af Amer: >90 mL/min (ref >90)
Glucose, Bld: 96 mg/dL (ref 70–99)
Potassium: 3.7 mEq/L (ref 3.7–5.3)
Sodium: 139 mEq/L (ref 137–147)
Total Bilirubin: 0.8 mg/dL (ref 0.3–1.2)
Total Protein: 7.3 g/dL (ref 6.0–8.3)

## 2014-05-19 LAB — URINALYSIS, ROUTINE W REFLEX MICROSCOPIC
Bilirubin Urine: NEGATIVE
Glucose, UA: NEGATIVE mg/dL
Hgb urine dipstick: NEGATIVE
Ketones, ur: NEGATIVE mg/dL
Nitrite: NEGATIVE
Protein, ur: NEGATIVE mg/dL
Specific Gravity, Urine: 1.006 (ref 1.005–1.030)
Urobilinogen, UA: 0.2 mg/dL (ref 0.0–1.0)
pH: 7 (ref 5.0–8.0)

## 2014-05-19 LAB — CBC WITH DIFFERENTIAL/PLATELET
Basophils Absolute: 0 10*3/uL (ref 0.0–0.1)
Basophils Relative: 1 % (ref 0–1)
Eosinophils Absolute: 0.2 10*3/uL (ref 0.0–0.7)
Eosinophils Relative: 3 % (ref 0–5)
HCT: 42.7 % (ref 39.0–52.0)
Hemoglobin: 14.2 g/dL (ref 13.0–17.0)
Lymphocytes Relative: 42 % (ref 12–46)
Lymphs Abs: 2.4 10*3/uL (ref 0.7–4.0)
MCH: 29.8 pg (ref 26.0–34.0)
MCHC: 33.3 g/dL (ref 30.0–36.0)
MCV: 89.5 fL (ref 78.0–100.0)
Monocytes Absolute: 0.6 10*3/uL (ref 0.1–1.0)
Monocytes Relative: 11 % (ref 3–12)
Neutro Abs: 2.4 10*3/uL (ref 1.7–7.7)
Neutrophils Relative %: 43 % (ref 43–77)
Platelets: 157 10*3/uL (ref 150–400)
RBC: 4.77 MIL/uL (ref 4.22–5.81)
RDW: 14.7 % (ref 11.5–15.5)
WBC: 5.5 10*3/uL (ref 4.0–10.5)

## 2014-05-19 LAB — URINE MICROSCOPIC-ADD ON

## 2014-05-19 MED ORDER — METOCLOPRAMIDE HCL 5 MG/ML IJ SOLN
10.0000 mg | Freq: Once | INTRAMUSCULAR | Status: AC
  Administered 2014-05-19: 10 mg via INTRAVENOUS
  Filled 2014-05-19: qty 2

## 2014-05-19 MED ORDER — DIPHENHYDRAMINE HCL 50 MG/ML IJ SOLN
25.0000 mg | Freq: Once | INTRAMUSCULAR | Status: AC
  Administered 2014-05-19: 25 mg via INTRAVENOUS
  Filled 2014-05-19: qty 1

## 2014-05-19 NOTE — ED Provider Notes (Signed)
CSN: [id number]     Arrival date & time 05/19/14  0730 History   First MD Initiated Contact with Patient 05/19/14 [phone]     Chief Complaint  Patient presents with  . Headache  . Post-op Aneurysm Repair     (Consider location/radiation/quality/duration/timing/severity/associated sxs/prior Treatment) Patient is a 58 y.o. male presenting with headaches.  Headache Pain location:  Generalized Radiates to:  Does not radiate Onset quality:  Gradual Duration:  10 hours Timing:  Constant Progression:  Partially resolved Chronicity:  Recurrent Context: not activity   Relieved by:  Nothing Worsened by:  Nothing tried Associated symptoms: no abdominal pain, no fever, no focal weakness, no numbness, no vomiting and no weakness     Past Medical History  Diagnosis Date  . Hypertension     Severe LVH with normal EF  . Arteriosclerotic cardiovascular disease (ASCVD) 2005    catheterization in 10/2010:50% mid LAD, diffuse distal disease, circumflex irregularities, large dominant RCA with a 50% ostial, 70% distal, 60% posterolateral and 70% PDA; normal EF  . Cerebrovascular disease 2010    R. carotid endarterectomy; Duplex in 10/2010-widely patent ICAs, subtotal left vertebral-not thought to be contributing to symptoms  . Hyperlipidemia   . Obesity   . Tobacco abuse     Quit 2014  . Benign prostatic hypertrophy   . Low back pain   . History of PSVT (paroxysmal supraventricular tachycardia) 2005    Diagnosed with ILR; no recurrence following RFA  . Cervical spine disease     CT in 2012-advanced degeneration and spondylosis with moderate spinal stenosis at C3-C6  . H/O: substance abuse     Cocaine, marijuana, alcohol.  Quit 2013.   [name] Syncope   . Gastroesophageal reflux disease   . Depression   . Erectile dysfunction   . Coronary artery disease   . Myocardial infarction   . Anginal pain   . Shortness of breath   . CHF (congestive heart failure)   . H/O hiatal hernia   .  Headache(784.0)   . Arthritis   . Carotid artery occlusion   . Pericardial effusion without cardiac tamponade 12/03/2013  . Non-ST elevation myocardial infarction (NSTEMI), initial episode of care 12/02/2013    DES LAD  . Thyroid disease    Past Surgical History  Procedure Laterality Date  . Radiofrequency ablation  2005    for PSVT  . Coronary angioplasty with stent placement  12/03/2013    LAD 90%-->0% W/ Promus Premier DES 3.0 mm x 16 mm, CFX OK, RCA 40%, EF 70-75%  . Carotid endarterectomy Right Feb. 25, 2010     CEA  . Trudee Kuster hole Right 04/13/2014    Procedure: Haskell Flirt;  Surgeon: Charlie Pitter, MD;  Location: [facility];  Service: Neurosurgery;  Laterality: Right;  . Left heart catheterization with coronary angiogram Left 12/03/2013    Procedure: LEFT HEART CATHETERIZATION WITH CORONARY ANGIOGRAM;  Surgeon: Leonie Man, MD;  Location: [facility] CATH LAB;  Service: Cardiovascular;  Laterality: Left;  . Percutaneous coronary stent intervention (pci-s)  12/03/2013    Procedure: PERCUTANEOUS CORONARY STENT INTERVENTION (PCI-S);  Surgeon: Leonie Man, MD;  Location: [facility] CATH LAB;  Service: Cardiovascular;;  . Left heart catheterization with coronary angiogram N/A 01/26/2014    Procedure: LEFT HEART CATHETERIZATION WITH CORONARY ANGIOGRAM;  Surgeon: Jettie Booze, MD;  Location: [facility] CATH LAB;  Service: Cardiovascular;  Laterality: N/A;   Family History  Problem Relation Age of Onset  . Hypertension Mother  Cerebrovascular disease  . Diabetes Mother   . Coronary artery disease Father 63  . Diabetes type II Father   . Hypertension Father   . Heart attack Father   . Lung cancer Paternal Uncle   . Diabetes Sister   . Hypertension Sister   . Heart attack Sister 29  . Diabetes Brother   . Hypertension Brother    History  Substance Use Topics  . Smoking status: Former Smoker -- 1.00 packs/day for 40 years    Types: Cigarettes    Quit date: 10/10/2012  . Smokeless tobacco:  Never Used     Comment: Quit in May.   . Alcohol Use: No     Comment: former drinker-- sober since 2013.     Review of Systems  Constitutional: Negative for fever.  Gastrointestinal: Negative for vomiting and abdominal pain.  Neurological: Positive for headaches. Negative for focal weakness and numbness.  All other systems reviewed and are negative.     Allergies  Lactose intolerance (gi)  Home Medications   Prior to Admission medications   Medication Sig Start Date End Date Taking? Authorizing Provider  acetaminophen (TYLENOL) 500 MG tablet Take 500 mg by mouth every 6 (six) hours as needed for moderate pain.   Yes Historical Provider, MD  clopidogrel (PLAVIX) 75 MG tablet Take 1 tablet (75 mg total) by mouth daily. 04/17/14  Yes Charlie Pitter, MD  colchicine 0.6 MG tablet Take 1 tablet (0.6 mg total) by mouth 2 (two) times daily as needed. 04/21/14  Yes [name], MD  esomeprazole (NEXIUM) 40 MG capsule Take 1 capsule (40 mg total) by mouth daily at 12 noon. 04/15/14  Yes [name], MD  furosemide (LASIX) 20 MG tablet Take 3 tablets (60 mg total) by mouth daily. 03/04/14  Yes [name], MD  isosorbide mononitrate (IMDUR) 60 MG 24 hr tablet Take 1 tablet (60 mg total) by mouth daily. 02/16/14  Yes Burtis Junes, NP  metoprolol succinate (TOPROL-XL) 50 MG 24 hr tablet Take 1 tablet (50 mg total) by mouth 2 (two) times daily. Take with or immediately following a meal. 04/01/14  Yes Larey Dresser, MD  nitroGLYCERIN (NITROSTAT) 0.4 MG SL tablet Place 1 tablet (0.4 mg total) under the tongue every 5 (five) minutes as needed for chest pain. 12/05/13  Yes Rhonda G Barrett, PA-C  potassium chloride SA (K-DUR,KLOR-CON) 20 MEQ tablet Take 1 tablet (20 mEq total) by mouth daily. 02/11/14  Yes Burtis Junes, NP  RAPAFLO 8 MG CAPS capsule Take 8 mg by mouth daily.  10/21/13  Yes Historical Provider, MD  simvastatin (ZOCOR) 40 MG tablet Take 40 mg by mouth daily.    Yes Historical  Provider, MD  traMADol (ULTRAM) 50 MG tablet Take 1-2 tablets (50-100 mg total) by mouth every 6 (six) hours as needed for moderate pain. 04/17/14  Yes Charlie Pitter, MD  verapamil (CALAN-SR) 180 MG CR tablet TAKE 1 TABLET BY MOUTH EVERY NIGHT AT BEDTIME 04/09/14  Yes [name], MD  HYDROcodone-acetaminophen (NORCO/VICODIN) 5-325 MG per tablet Take 1 tablet by mouth every 6 (six) hours as needed for moderate pain. Patient not taking: Reported on 05/19/2014 04/21/14   [name], MD   BP 114/65 mmHg  Pulse 75  Temp(Src) 97.9 F (36.6 C)  Resp 16  Ht 6' (1.829 m)  Wt 259 lb (117.482 kg)  BMI 35.12 kg/m2  SpO2 99% Physical Exam  Constitutional: He is oriented to  person, place, and time. He appears well-developed and well-nourished.  HENT:  Head: Normocephalic and atraumatic.  Eyes: Conjunctivae and EOM are normal.  Neck: Normal range of motion. Neck supple.  Cardiovascular: Normal rate, regular rhythm and normal heart sounds.   Pulmonary/Chest: Effort normal and breath sounds normal. No respiratory distress.  Abdominal: He exhibits no distension. There is no tenderness. There is no rebound and no guarding.  Musculoskeletal: Normal range of motion.  Neurological: He is alert and oriented to person, place, and time. He has normal strength and normal reflexes. No cranial nerve deficit or sensory deficit. GCS eye subscore is 4. GCS verbal subscore is 5. GCS motor subscore is 6.  Skin: Skin is warm and dry.  Vitals reviewed.   ED Course  Procedures (including critical care time) Labs Review Labs Reviewed  COMPREHENSIVE METABOLIC PANEL - Abnormal; Notable for the following:    Albumin 3.4 (*)    All other components within normal limits  URINALYSIS, ROUTINE W REFLEX MICROSCOPIC - Abnormal; Notable for the following:    Leukocytes, UA SMALL (*)    All other components within normal limits  CBC WITH DIFFERENTIAL  URINE MICROSCOPIC-ADD ON    Imaging Review Ct Head Wo  Contrast  05/19/2014   CLINICAL DATA:  Initial encounter. Right parietal headache at 2200 hr last night. History of aneurysm repair 4 weeks ago.  EXAM: CT HEAD WITHOUT CONTRAST  TECHNIQUE: Contiguous axial images were obtained from the base of the skull through the vertex without intravenous contrast.  COMPARISON:  04/16/2014, 04/11/2014  FINDINGS: There is a low-density extra-axial fluid collection along the right cerebral convexity consistent with a chronic subdural hematoma measuring 10 mm in thickness. There is a 8 mm focus of hyperdensity in the fluid collection concerning for a small focus of superimposed acute subdural hemorrhage. There is interval resolution of pneumocephalus. There is improved midline shift measuring 4 mm towards the left compared with the prior examination at which time there was approximately 10 mm of midline shift. There is no space-occupying lesion. There is no evidence of a cortical-based area of acute infarction.  The ventricles and sulci are appropriate for the patient's age. The basal cisterns are patent.  Visualized portions of the orbits are unremarkable. The visualized portions of the paranasal sinuses and mastoid air cells are unremarkable.  There is a right frontal and right parietal burr hole.  IMPRESSION: 1. Chronic small right subdural hematoma with a superimposed small focus of acute subdural blood measuring 8 mm. 2. Interval improvement in degree of midline shift measuring 4 mm towards the left compared with 8 mm on the prior exam. 3. No hydrocephalus.   Electronically Signed   By: Kathreen Devoid   On: 05/19/2014 09:01     EKG Interpretation None      MDM   Final diagnoses:  Acute nonintractable headache, unspecified headache type  Subdural hematoma    58 y.o. male with pertinent PMH of recent [facility] evacuation presents with headache beginning last night.  SDH was evacuated last month, and pt has had similar headaches since that time intermittently, however  this one was still present when patient awoke so presented for further evaluation. On arrival vital signs and physical exam as above. No focal neurodeficits. Headache relieved after Reglan and Benadryl. CT scan as above with radiology concern for acute on chronic subdural. I spoke with neurosurgery, Dr. Annette Stable, who personally evaluated the scans and feels that this is normal resolving CT findings and recommends discharge  home. The patient has scheduled follow-up with Dr. Annette Stable tomorrow.  Given that he has no focal neurodeficits, mild and now resolved symptoms, feel his discharge reasonable. Patient was given very strict return precautions for any worsening headache, will be in the presence of his wife for the rest of the day.  I encouraged him that he is having a headache prior to going to sleep tonight that he should immediately report back to the emergency room.  Patient voiced understanding of precautions, stated that he would like to go home. Discharged home in improved condition.  I have reviewed all laboratory and imaging studies if ordered as above  1. Acute nonintractable headache, unspecified headache type   2. Subdural hematoma         Debby Freiberg, MD 05/19/14 [phone]

## 2014-05-19 NOTE — ED Notes (Signed)
Pt c/o headache, has had an aneurysm repair 4 weeks ago-- states headache is over right eye, "had a sharp pain for a second, but that went away, now i have a headache" no neuro deficits.

## 2014-05-19 NOTE — Discharge Instructions (Signed)
Subdural Hematoma  A subdural hematoma is a collection of blood between the brain and its tough outermost membrane covering (the dura).  Blood clots that form in this area push down on the brain and cause irritation. A subdural hematoma may cause parts of the brain to stop working and eventually cause death.   CAUSES  A subdural hematoma is caused by bleeding from a ruptured blood vessel (hemorrhage). The bleeding results from trauma to the head, such as from a fall or motor vehicle accident.  There are two types of subdural hemorrhages:  · Acute. This type develops shortly after a serious blow to the head and causes blood to collect very quickly. If not diagnosed and treated promptly, severe brain injury or death can occur.  · Chronic. This is when bleeding develops more slowly, over weeks or months.  RISK FACTORS  People at risk for subdural hematoma include older persons, infants, and alcoholics.  SYMPTOMS  An acute subdural hemorrhage develops over minutes to hours. Symptoms can include:  · Temporary loss of consciousness.  · Weakness of arms or legs on one side of the body.  · Changes in vision or speech.  · A severe headache.  · Seizures.  · Nausea and vomiting.  · Increased sleepiness.  A chronic subdural hemorrhage develops over weeks to months. Symptoms may develop slowly and produce less noticeable problems or changes. Symptoms include:  · A mild headache.  · A change in personality.  · Loss of balance or difficulty walking.  · Weakness, numbness, or tingling in the arms or legs.  · Nausea or vomiting.  · Memory loss.  · Double vision.  · Increased sleepiness.  DIAGNOSIS  Your health care provider will perform a thorough physical and neurological exam. A CT scan or MRI may also be done. If there is blood on the scan, its color will help your health care provider determine how long the hemorrhage has been there.  TREATMENT  If the cause is an acute subdural hemorrhage, immediate treatment is needed. In many  cases an emergency surgery is performed to drain accumulated blood or to remove the blood clot. Sometimes steroid or diuretic medicines or controlled breathing through a ventilator is needed to decrease pressure in the brain. This is especially true if there is any swelling of the brain.  If the cause is a chronic subdural hemorrhage, treatment depends on a variety of factors. Sometimes no treatment is needed. If the subdural hematoma is small and causes minimal or no symptoms, you may be treated with bed rest, medicines, and observation. If the hemorrhage is large or if you have neurological symptoms, an emergency surgery is usually needed to remove the blood clot.  People who develop a subdural hemorrhage are at risk of developing seizures, even after the subdural hematoma has been treated. You may be prescribed an anti-seizure (anticonvulsant) medicine for a year or longer.  HOME CARE INSTRUCTIONS  · Only take medicines as directed by your health care provider.  · Rest if directed by your health care provider.  · Keep all follow-up appointments with your health care provider.  · If you play a contact sport such as football, hockey or soccer and you experienced a significant head injury, allow enough time for healing (up to 15 days) before you start playing again. A repeated injury that occurs during this fragile repair period is likely to result in hemorrhage. This is called the second impact syndrome.  SEEK IMMEDIATE MEDICAL CARE IF:  ·   You fall or experience minor trauma to your head and you are taking blood thinners. If you are on any blood thinners even a very small injury can cause a subdural hematoma. You should not hesitate to seek medical attention regardless of how minor you think your symptoms are.  · You experience a head injury and have:  ¨ Drowsiness or a decrease in alertness.  ¨ Confusion or forgetfulness.  ¨ Slurred speech.  ¨ Irrational or aggressive behavior.  ¨ Numbness or paralysis in any part  of the body.  ¨ A feeling of being sick to your stomach (nauseous) or you throw up (vomit).  ¨ Difficulty walking or poor coordination.  ¨ Double vision.  ¨ Seizures.  ¨ A bleeding disorder.  ¨ A history of heavy alcohol use.  ¨ Clear fluid draining from your nose or ears.  ¨ Personality changes.  ¨ Difficulty thinking.  ¨ Worsening symptoms.  MAKE SURE YOU:  · Understand these instructions.  · Will watch your condition.  · Will get help right away if you are not doing well or get worse.  FOR MORE INFORMATION  National Institute of Neurological Disorders and Stroke: www.ninds.nih.gov  American Association of Neurological Surgeons: www.neurosurgerytoday.org  American Academy of Neurology (AAN): www.aan.com  Brain Injury Association of America: www.biausa.org  Document Released: 04/08/2004 Document Revised: 03/12/2013 Document Reviewed: 11/22/2012  ExitCare® Patient Information ©2015 [employer]. This information is not intended to replace advice given to you by your health care provider. Make sure you discuss any questions you have with your health care provider.

## 2014-05-26 ENCOUNTER — Encounter: Payer: Self-pay | Admitting: Family Medicine

## 2014-05-26 ENCOUNTER — Ambulatory Visit (INDEPENDENT_AMBULATORY_CARE_PROVIDER_SITE_OTHER): Payer: 59 | Admitting: Family Medicine

## 2014-05-26 DIAGNOSIS — M1711 Unilateral primary osteoarthritis, right knee: Secondary | ICD-10-CM

## 2014-05-26 DIAGNOSIS — M179 Osteoarthritis of knee, unspecified: Secondary | ICD-10-CM | POA: Insufficient documentation

## 2014-05-26 DIAGNOSIS — M10071 Idiopathic gout, right ankle and foot: Secondary | ICD-10-CM

## 2014-05-26 DIAGNOSIS — M1710 Unilateral primary osteoarthritis, unspecified knee: Secondary | ICD-10-CM | POA: Insufficient documentation

## 2014-05-26 MED ORDER — HYDROCODONE-ACETAMINOPHEN 5-325 MG PO TABS: 1.0000 | ORAL_TABLET | Freq: Four times a day (QID) | ORAL | Status: DC | PRN

## 2014-05-26 MED ORDER — COLCHICINE 0.6 MG PO TABS: 0.6000 mg | ORAL_TABLET | Freq: Two times a day (BID) | ORAL | Status: DC | PRN

## 2014-05-26 NOTE — Assessment & Plan Note (Signed)
Treat with colcrys 1.2 mg x 1 dose, then 0.6mg  x 6 days, total of 7 days For his foot, concern may have gout in his knee as well

## 2014-05-26 NOTE — Assessment & Plan Note (Signed)
Knee pain pain, has known OA from xrays 2013, concern he may have gout in knee as well, vs meniscal injury based on exam Will send to ortho for evaluation Pain meds given,

## 2014-05-26 NOTE — Progress Notes (Signed)
Patient ID: Peter Dunlap, male   DOB: 06-07-1955, 58 y.o.   MRN: [id number]   Subjective:    Patient ID: Peter Dunlap, male    DOB: 11-15-1955, 58 y.o.   MRN: [id number]  Patient presents for R Knee Pain  patient here with right knee and right great toe pain for the past few days. Actually evaluated his knee about a month ago at that time he felt like his knee was given out on him but he did not having significant swelling. The past few days he's had swelling of his knee  And his great toe. He ran out of his hydrocodone. Of note he was also recently seen in the ER secondary to severe headache in the setting of his subdural hematoma he was seen by neurosurgery recently and things are resolving. He would like to be seen by the same orthopedist as his wife Dr. Eulas Dunlap    Review Of Systems:  GEN- denies fatigue, fever, weight loss,weakness, recent illness HEENT- denies eye drainage, change in vision, nasal discharge, CVS- denies chest pain, palpitations RESP- denies SOB, cough, wheeze ABD- denies N/V, change in stools, abd pain GU- denies dysuria, hematuria, dribbling, incontinence MSK- + joint pain, muscle aches, injury Neuro- denies headache, dizziness, syncope, seizure activity       Objective:    BP 128/78 mmHg  Pulse 78  Temp(Src) 97.2 F (36.2 C) (Oral)  Resp 16  Ht 6' (1.829 m)  Wt 263 lb (119.296 kg)  BMI 35.66 kg/m2 GEN- NAD, alert and oriented x3 MSK - Right knee- + effusion, decreasd ROM, no warmth,no erythema, ligaments in tact,pain with rotation of knee Swelling of right great toe, mild warmth, no erythema Pulse- DP 2+        Assessment & Plan:      Problem List Items Addressed This Visit      Unprioritized   Gout    Treat with colcrys 1.2 mg x 1 dose, then 0.6mg  x 6 days, total of 7 days For his foot, concern may have gout in his knee as well    Relevant Medications      colchicine 0.6 MG tablet    Other Visit Diagnoses    Ischemic chest pain     -  Primary    Relevant Medications       colchicine 0.6 MG tablet    Dyspnea        Relevant Medications       colchicine 0.6 MG tablet    Weight loss        Relevant Medications       colchicine 0.6 MG tablet       Note: This dictation was prepared with Dragon dictation along with smaller phrase technology. Any transcriptional errors that result from this process are unintentional.

## 2014-05-26 NOTE — Patient Instructions (Signed)
Take 2 tablets of colchine x 1 dose, then 1 tablet daily for 6 days -- total 7 days Pain meds refilled F/U as previous

## 2014-06-02 ENCOUNTER — Other Ambulatory Visit: Payer: Self-pay | Admitting: Nurse Practitioner

## 2014-06-12 ENCOUNTER — Ambulatory Visit
Admission: RE | Admit: 2014-06-12 | Discharge: 2014-06-12 | Disposition: A | Discharge status: Home / Self Care | Payer: 59 | Source: Ambulatory Visit | Attending: Orthopedic Surgery | Admitting: Orthopedic Surgery

## 2014-06-12 ENCOUNTER — Other Ambulatory Visit: Payer: Self-pay | Admitting: Orthopedic Surgery

## 2014-06-12 DIAGNOSIS — M2391 Unspecified internal derangement of right knee: Secondary | ICD-10-CM

## 2014-06-18 ENCOUNTER — Encounter: Payer: Self-pay | Admitting: Family Medicine

## 2014-07-08 ENCOUNTER — Other Ambulatory Visit: Payer: Self-pay | Admitting: Orthopedic Surgery

## 2014-07-08 DIAGNOSIS — M2391 Unspecified internal derangement of right knee: Secondary | ICD-10-CM

## 2014-07-20 ENCOUNTER — Other Ambulatory Visit: Payer: 59

## 2014-07-23 ENCOUNTER — Other Ambulatory Visit (HOSPITAL_COMMUNITY): Payer: Self-pay | Admitting: Cardiology

## 2014-07-24 ENCOUNTER — Encounter: Payer: Self-pay | Admitting: Family Medicine

## 2014-07-24 ENCOUNTER — Ambulatory Visit (INDEPENDENT_AMBULATORY_CARE_PROVIDER_SITE_OTHER): Payer: Medicare Other | Admitting: Family Medicine

## 2014-07-24 VITALS — BP 126/74 | HR 68 | Temp 98.6°F | Resp 14 | Ht 72.0 in | Wt 298.0 lb

## 2014-07-24 DIAGNOSIS — E785 Hyperlipidemia, unspecified: Secondary | ICD-10-CM

## 2014-07-24 DIAGNOSIS — F5104 Psychophysiologic insomnia: Secondary | ICD-10-CM

## 2014-07-24 DIAGNOSIS — I10 Essential (primary) hypertension: Secondary | ICD-10-CM

## 2014-07-24 DIAGNOSIS — E669 Obesity, unspecified: Secondary | ICD-10-CM

## 2014-07-24 DIAGNOSIS — E1169 Type 2 diabetes mellitus with other specified complication: Secondary | ICD-10-CM

## 2014-07-24 DIAGNOSIS — G4700 Insomnia, unspecified: Secondary | ICD-10-CM

## 2014-07-24 DIAGNOSIS — I5032 Chronic diastolic (congestive) heart failure: Secondary | ICD-10-CM

## 2014-07-24 DIAGNOSIS — E0590 Thyrotoxicosis, unspecified without thyrotoxic crisis or storm: Secondary | ICD-10-CM

## 2014-07-24 DIAGNOSIS — E119 Type 2 diabetes mellitus without complications: Secondary | ICD-10-CM

## 2014-07-24 MED ORDER — TRAZODONE HCL 50 MG PO TABS: 25.0000 mg | ORAL_TABLET | Freq: Every evening | ORAL | Status: DC | PRN

## 2014-07-24 NOTE — Assessment & Plan Note (Signed)
He had significant weight loss due to his hyperthyroidism now I think that he is now hypothyroid after the ablation he has follow-up with his endocrinologist with the complexity of his case I will defer to them for management

## 2014-07-24 NOTE — Assessment & Plan Note (Signed)
Diet controlled, recheck A1C, concern with weight gain

## 2014-07-24 NOTE — Patient Instructions (Signed)
Continue Current medications Get the labs done fasting Low carb, low sugar diet Try the trazodone for the sleep- take 1 hour before bedtime F/U 3 months

## 2014-07-24 NOTE — Assessment & Plan Note (Signed)
Well controlled, no change to meds 

## 2014-07-24 NOTE — Progress Notes (Signed)
Patient ID: QUILL GRINDER, male   DOB: Mar 24, 1956, 59 y.o.   MRN: [id number]   Subjective:    Patient ID: MOE GRACA, male    DOB: May 30, 1956, 59 y.o.   MRN: [id number]  Patient presents for 3 month F/U  patient here to follow chronic medical problems. He has done well since his subdural hematoma. His only concern is that he is not sleeping very well he tends to wake up many times during the night and has difficulty falling asleep. He has been using his C Pap as prescribed with still has difficulty sleeping.  He is due to see his orthopedist and is having an MRI of his right knee he is not sure if he needs surgical intervention.  He has gained about 15 pounds over the past few months of note he is status post ablation for his hyperthyroidism and he was told that he will likely become hypothyroid and will need replacement. He is follow-up with endocrinology in a few weeks.  Diet controlled diabetes mellitus his blood sugars have been around the 150s fasting.    Review Of Systems:  GEN- denies fatigue, fever, weight loss,weakness, recent illness HEENT- denies eye drainage, change in vision, nasal discharge, CVS- denies chest pain, palpitations RESP- denies SOB, cough, wheeze ABD- denies N/V, change in stools, abd pain GU- denies dysuria, hematuria, dribbling, incontinence MSK- +joint pain, muscle aches, injury Neuro- denies headache, dizziness, syncope, seizure activity       Objective:    BP 126/74 mmHg  Pulse 68  Temp(Src) 98.6 F (37 C) (Oral)  Resp 14  Ht 6' (1.829 m)  Wt 298 lb (135.172 kg)  BMI 40.41 kg/m2 GEN- NAD, alert and oriented x3, obese HEENT- PERRL, EOMI, non injected sclera, pink conjunctiva, MMM, oropharynx clear Neck- Supple, no LAD, no thyromegaly CVS- irregular rhythm, normal rate, no murmur RESP-CTAB Psych- normal affect and mood EXT- No edema  Pulses- Radial, DP- 2+      Assessment & Plan:      Problem List Items Addressed This Visit       Unprioritized   Hyperthyroidism   Hypertension - Primary (Chronic)   Relevant Orders   CBC with Differential/Platelet   Comprehensive metabolic panel   Hyperlipidemia (Chronic)   Relevant Orders   Lipid panel   Diabetes mellitus type 2 in obese (Chronic)   Relevant Orders   Hemoglobin A1c   Chronic insomnia   Chronic diastolic heart failure      Note: This dictation was prepared with Dragon dictation along with smaller phrase technology. Any transcriptional errors that result from this process are unintentional.

## 2014-07-24 NOTE — Assessment & Plan Note (Signed)
Trial of trazodone , start to 1/2 tablet then increase to `1 tablet

## 2014-07-24 NOTE — Assessment & Plan Note (Signed)
Recheck lipids, continue statin

## 2014-07-24 NOTE — Assessment & Plan Note (Signed)
Currently compensated 

## 2014-07-28 LAB — CBC WITH DIFFERENTIAL/PLATELET
Basophils Absolute: 0.1 10*3/uL (ref 0.0–0.1)
Basophils Relative: 1 % (ref 0–1)
Eosinophils Absolute: 0.1 10*3/uL (ref 0.0–0.7)
Eosinophils Relative: 2 % (ref 0–5)
HCT: 45.2 % (ref 39.0–52.0)
Hemoglobin: 15.3 g/dL (ref 13.0–17.0)
Lymphocytes Relative: 43 % (ref 12–46)
Lymphs Abs: 2.7 10*3/uL (ref 0.7–4.0)
MCH: 31.9 pg (ref 26.0–34.0)
MCHC: 33.8 g/dL (ref 30.0–36.0)
MCV: 94.4 fL (ref 78.0–100.0)
MPV: 10.8 fL (ref 8.6–12.4)
Monocytes Absolute: 0.6 10*3/uL (ref 0.1–1.0)
Monocytes Relative: 10 % (ref 3–12)
Neutro Abs: 2.8 10*3/uL (ref 1.7–7.7)
Neutrophils Relative %: 44 % (ref 43–77)
Platelets: 153 10*3/uL (ref 150–400)
RBC: 4.79 MIL/uL (ref 4.22–5.81)
RDW: 14.8 % (ref 11.5–15.5)
WBC: 6.3 10*3/uL (ref 4.0–10.5)

## 2014-07-28 LAB — COMPREHENSIVE METABOLIC PANEL
ALT: 10 U/L (ref 0–53)
AST: 17 U/L (ref 0–37)
Albumin: 3.9 g/dL (ref 3.5–5.2)
Alkaline Phosphatase: 91 U/L (ref 39–117)
BUN: 13 mg/dL (ref 6–23)
CO2: 27 mEq/L (ref 19–32)
Calcium: 9.3 mg/dL (ref 8.4–10.5)
Chloride: 104 mEq/L (ref 96–112)
Creat: 1.24 mg/dL (ref 0.50–1.35)
Glucose, Bld: 106 mg/dL — ABNORMAL HIGH (ref 70–99)
Potassium: 3.9 mEq/L (ref 3.5–5.3)
Sodium: 141 mEq/L (ref 135–145)
Total Bilirubin: 0.8 mg/dL (ref 0.2–1.2)
Total Protein: 6.8 g/dL (ref 6.0–8.3)

## 2014-07-28 LAB — LIPID PANEL
Cholesterol: 155 mg/dL (ref 0–200)
HDL: 48 mg/dL (ref >=40)
LDL Cholesterol: 87 mg/dL (ref 0–99)
Total CHOL/HDL Ratio: 3.2 Ratio
Triglycerides: 100 mg/dL (ref <150)
VLDL: 20 mg/dL (ref 0–40)

## 2014-07-28 LAB — HEMOGLOBIN A1C
Hgb A1c MFr Bld: 5.9 % — ABNORMAL HIGH (ref <5.7)
Mean Plasma Glucose: 123 mg/dL — ABNORMAL HIGH (ref <117)

## 2014-07-29 ENCOUNTER — Ambulatory Visit: Payer: 59

## 2014-07-29 ENCOUNTER — Encounter (INDEPENDENT_AMBULATORY_CARE_PROVIDER_SITE_OTHER): Payer: 59

## 2014-07-29 ENCOUNTER — Ambulatory Visit (INDEPENDENT_AMBULATORY_CARE_PROVIDER_SITE_OTHER): Payer: 59 | Admitting: Internal Medicine

## 2014-07-29 ENCOUNTER — Encounter: Payer: Self-pay | Admitting: *Deleted

## 2014-07-29 ENCOUNTER — Ambulatory Visit (HOSPITAL_COMMUNITY)
Admission: RE | Admit: 2014-07-29 | Discharge: 2014-07-29 | Disposition: A | Discharge status: Home / Self Care | Payer: Medicare Other | Source: Ambulatory Visit | Attending: Adult Health | Admitting: Adult Health

## 2014-07-29 ENCOUNTER — Encounter: Payer: Self-pay | Admitting: Internal Medicine

## 2014-07-29 VITALS — BP 102/68 | HR 72 | Ht 72.0 in | Wt 297.0 lb

## 2014-07-29 VITALS — BP 128/76 | HR 71 | Wt 300.0 lb

## 2014-07-29 DIAGNOSIS — I6521 Occlusion and stenosis of right carotid artery: Secondary | ICD-10-CM | POA: Insufficient documentation

## 2014-07-29 DIAGNOSIS — R55 Syncope and collapse: Secondary | ICD-10-CM

## 2014-07-29 DIAGNOSIS — E785 Hyperlipidemia, unspecified: Secondary | ICD-10-CM | POA: Diagnosis not present

## 2014-07-29 DIAGNOSIS — Z7902 Long term (current) use of antithrombotics/antiplatelets: Secondary | ICD-10-CM | POA: Insufficient documentation

## 2014-07-29 DIAGNOSIS — G4733 Obstructive sleep apnea (adult) (pediatric): Secondary | ICD-10-CM | POA: Insufficient documentation

## 2014-07-29 DIAGNOSIS — I495 Sick sinus syndrome: Secondary | ICD-10-CM

## 2014-07-29 DIAGNOSIS — I5032 Chronic diastolic (congestive) heart failure: Secondary | ICD-10-CM

## 2014-07-29 DIAGNOSIS — I480 Paroxysmal atrial fibrillation: Secondary | ICD-10-CM

## 2014-07-29 DIAGNOSIS — I481 Persistent atrial fibrillation: Secondary | ICD-10-CM | POA: Insufficient documentation

## 2014-07-29 DIAGNOSIS — I10 Essential (primary) hypertension: Secondary | ICD-10-CM | POA: Insufficient documentation

## 2014-07-29 DIAGNOSIS — I2510 Atherosclerotic heart disease of native coronary artery without angina pectoris: Secondary | ICD-10-CM | POA: Insufficient documentation

## 2014-07-29 DIAGNOSIS — Z87891 Personal history of nicotine dependence: Secondary | ICD-10-CM | POA: Insufficient documentation

## 2014-07-29 DIAGNOSIS — E0590 Thyrotoxicosis, unspecified without thyrotoxic crisis or storm: Secondary | ICD-10-CM | POA: Insufficient documentation

## 2014-07-29 DIAGNOSIS — K219 Gastro-esophageal reflux disease without esophagitis: Secondary | ICD-10-CM | POA: Insufficient documentation

## 2014-07-29 DIAGNOSIS — E119 Type 2 diabetes mellitus without complications: Secondary | ICD-10-CM | POA: Insufficient documentation

## 2014-07-29 DIAGNOSIS — M109 Gout, unspecified: Secondary | ICD-10-CM | POA: Insufficient documentation

## 2014-07-29 DIAGNOSIS — I422 Other hypertrophic cardiomyopathy: Secondary | ICD-10-CM | POA: Diagnosis not present

## 2014-07-29 DIAGNOSIS — I471 Supraventricular tachycardia: Secondary | ICD-10-CM

## 2014-07-29 DIAGNOSIS — I252 Old myocardial infarction: Secondary | ICD-10-CM | POA: Insufficient documentation

## 2014-07-29 NOTE — Assessment & Plan Note (Signed)
No recurrent symptoms of SVT. Will follow.

## 2014-07-29 NOTE — Assessment & Plan Note (Signed)
The etiology is unclear but it sounds like he is having pauses. He is wearing a cardiac monitor. I will see him back in several weeks. He may need a PPM.

## 2014-07-29 NOTE — Progress Notes (Signed)
Patient ID: Peter Dunlap, male   DOB: 11-02-1955, 59 y.o.   MRN: [id number] PCP: Peter Dunlap EP: Peter Dunlap  59 yo with history of HOCM (versus LV changes from severe long-standing HTN), CAD, persistent atrial fibrillation, and chronic diastolic CHF presents for CHF clinic evaluation.  He was admitted in 6/15 with NSTEMI and had DES to mLAD.  He was admitted again in 8/15 with unstable angina and had DES to OM1.  At the time of the 6/15 admission, he was noted to be in atrial fibrillation, and it appears that this has been persistent since then.  He is on warfarin, INR has been therapeutic.   He was admitted to [facility] with headache and had subdural hematoma. Hematoma was evacuated on 04/13/14. He was discharged on off coumadin and aspirin. He was discharged on plavix.   He returns for follow up. He has had presyncope for the last couple of months. Says it happens once or twice a week. Denies SOB/PND/Orthopnea. He has had no falls. No weighting at home.   Labs (9/15): K 4, creaitnine 0.9, BNP 448, HCT 41.3, TSH low, free T4 and free T3 elevated  PMH: 1. SVT: s/p RF ablation.  2. GERD 3. Hyperlipidemia 4. Hypertrophic obstructive cardiomyopathy (versus LV changes from severe long-standing HTN): Echo (6/15) with chordal SAM, severe LVH, EF 60%, LVOT peak gradient 53 mmHg.  Echo (8/15) with severe concentric LVH, EF 70%, SAM of anterior mitral leaflet, unable to assess LVOT gradient on this study.  5. Persistent atrial fibrillation: on warfarin.  6. Type II diabetes 7. OSA: On CPAP at night.  8. Gout 9. Carotid stenosis: s/p right CEA, followed by [facility].  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 Hyperthyroidism: s/p RAI ablation, followed by Dr. Laverta Dunlap.  12. CAD: 6/15 NSTEMI, Promus DES to mLAD with 60-70% residual RCA stenosis.  Unstable angina (8/15) witih 80% OM1 on cath, had DES to OM1 (mLAD stent remained patent).   13. Chronic diastolic CHF 14. History of moderate pericardial effusion.   SH: Lives in  [place], quit smoking in 2014, prior cocaine abuse (none now).   FH: CAD in father and sister, no history of sudden cardiac death.   ROS: All systems reviewed and negative except as per HPI.   Current Outpatient Prescriptions  Medication Sig Dispense Refill  . acetaminophen (TYLENOL) 500 MG tablet Take 500 mg by mouth every 6 (six) hours as needed for moderate pain.    [name] clopidogrel (PLAVIX) 75 MG tablet Take 1 tablet (75 mg total) by mouth daily. 30 tablet 5  . colchicine 0.6 MG tablet Take 1 tablet (0.6 mg total) by mouth 2 (two) times daily as needed. 14 tablet 1  . esomeprazole (NEXIUM) 40 MG capsule Take 1 capsule (40 mg total) by mouth daily at 12 noon. 30 capsule 3  . furosemide (LASIX) 20 MG tablet Take 3 tablets (60 mg total) by mouth daily. 90 tablet 3  . HYDROcodone-acetaminophen (NORCO/VICODIN) 5-325 MG per tablet Take 1 tablet by mouth every 6 (six) hours as needed for moderate pain. 60 tablet 0  . isosorbide mononitrate (IMDUR) 60 MG 24 hr tablet Take 1 tablet (60 mg total) by mouth daily. 90 tablet 3  . metoprolol succinate (TOPROL-XL) 50 MG 24 hr tablet TAKE 1 TABLET BY MOUTH TWICE DAILY. TAKE WITH OR IMMEDIATELY FOLLOWING A MEAL 60 tablet 3  . nitroGLYCERIN (NITROSTAT) 0.4 MG SL tablet Place 1 tablet (0.4 mg total) under the tongue every 5 (five) minutes as needed  for chest pain. 25 tablet 12  . potassium chloride SA (K-DUR,KLOR-CON) 20 MEQ tablet TAKE 1 TABLET BY MOUTH DAILY 30 tablet 3  . RAPAFLO 8 MG CAPS capsule Take 8 mg by mouth daily.     . simvastatin (ZOCOR) 40 MG tablet Take 40 mg by mouth daily.     . traZODone (DESYREL) 50 MG tablet Take 0.5-1 tablets (25-50 mg total) by mouth at bedtime as needed for sleep. 30 tablet 3  . verapamil (CALAN-SR) 180 MG CR tablet TAKE 1 TABLET BY MOUTH EVERY NIGHT AT BEDTIME 30 tablet 3   No current facility-administered medications for this encounter.   BP 128/76 mmHg  Pulse 71  Wt 300 lb (136.079 kg)  SpO2 98% General:  NAD Neck: No JVD, no thyromegaly or thyroid nodule.  Lungs: Clear to auscultation bilaterally with normal respiratory effort. CV: Nondisplaced PMI.  Heart irregular S1/S2, no S3/S4, 2/6 early SEM RUSB.  No peripheral edema.  No carotid bruit.  Normal pedal pulses.  Abdomen: Soft, nontender, no hepatosplenomegaly, no distention.  Skin: Intact without lesions or rashes.  Neurologic: Alert and oriented x 3.  Psych: Normal affect. Extremities: No clubbing or cyanosis.  HEENT: Normal.   EKG- Sinus Arrest with junctional escape beats and retrograde p-waves  Assessment/plan: 1. Atrial fibrillation: Today he is intermittent sinus arrest with intermittent junctional rhythm. Stop bb and verapamil. Place event monitor and refer back to Dr Lovena Dunlap.  2. Hypertrophic cardiomyopathy: Concentric severe LVH (not asymmetric) but there appears to be mitral valve SAM with LVOT gradient.   -Needs cardiac MRI to better assess HOCM and also for SCD risk stratification (looking for amount of scar in LV).  - Stop verapamil and Toprol XL due to sinus node dysfx.    3. CAD: No further chest pain.  He will continue on Plavix, and statin.  4. Hyperlipidemia: Check lipids with goal LDL < 70.  5. Carotid stenosis: Followed by [facility].  6. Hyperthyroid- had ablation and now thought to have hypothyroidism. Referred to endocrinologist in February.  7. Presyncope- Will need event monitior. Likely due to sinus node dysfunction. Stop bb and verapamil. As above place event monitor. Refer back EP. 8. H/O subdural hematoma  04/2015. Off coumadin and aspirin.   Follow up in 2 months    Peter Dunlap,Peter Dunlap  07/29/2014  Patient seen and examined with Peter Grinder, NP. We discussed all aspects of the encounter. I agree with the assessment and plan as stated above.   Volume status looks good. Main issue is fatigue and presyncope. ECG shows sinus node dysfunction. Will stop verapamil and b-blocker. Place monitor. Refer to EP regarding  possible PPM. Watch BP. If increasing can add norvasc but will need to be careful with LVOT obstruction.   Daniel Bensimhon,MD 5:59 PM

## 2014-07-29 NOTE — Progress Notes (Signed)
HPI [name] returns today for followup. He is a pleasant middle aged man with a h/o tobacco abuse, SVT, s/p catheter ablation, HTN and atrial fibrillation and CAD. He underwent insertion of a non-drug eluting stent to the LAD when he presented with chest pain and was found to have an 80% LAD stenosis. His dyspnea is much improved.He denies chest pain, syncope but also had mild peripheral edema. His main complaint today is that he is having episodes of dizziness for which he feels like he might pass out. He is now wearing a cardiac monitor.  Allergies  Allergen Reactions  . Lactose Intolerance (Gi) Other (See Comments)    UPSET STOMACH      Current Outpatient Prescriptions  Medication Sig Dispense Refill  . acetaminophen (TYLENOL) 500 MG tablet Take 500 mg by mouth every 6 (six) hours as needed for moderate pain.    [name] clopidogrel (PLAVIX) 75 MG tablet Take 1 tablet (75 mg total) by mouth daily. 30 tablet 5  . colchicine 0.6 MG tablet Take 1 tablet (0.6 mg total) by mouth 2 (two) times daily as needed. 14 tablet 1  . esomeprazole (NEXIUM) 40 MG capsule Take 1 capsule (40 mg total) by mouth daily at 12 noon. 30 capsule 3  . furosemide (LASIX) 20 MG tablet Take 3 tablets (60 mg total) by mouth daily. 90 tablet 3  . HYDROcodone-acetaminophen (NORCO/VICODIN) 5-325 MG per tablet Take 1 tablet by mouth every 6 (six) hours as needed for moderate pain. 60 tablet 0  . isosorbide mononitrate (IMDUR) 60 MG 24 hr tablet Take 1 tablet (60 mg total) by mouth daily. 90 tablet 3  . nitroGLYCERIN (NITROSTAT) 0.4 MG SL tablet Place 1 tablet (0.4 mg total) under the tongue every 5 (five) minutes as needed for chest pain. 25 tablet 12  . potassium chloride SA (K-DUR,KLOR-CON) 20 MEQ tablet TAKE 1 TABLET BY MOUTH DAILY 30 tablet 3  . RAPAFLO 8 MG CAPS capsule Take 8 mg by mouth daily.     . simvastatin (ZOCOR) 40 MG tablet Take 40 mg by mouth daily.     . traZODone (DESYREL) 50 MG tablet Take 0.5-1  tablets (25-50 mg total) by mouth at bedtime as needed for sleep. 30 tablet 3   No current facility-administered medications for this visit.     Past Medical History  Diagnosis Date  . Hypertension     Severe LVH with normal EF  . Arteriosclerotic cardiovascular disease (ASCVD) 2005    catheterization in 10/2010:50% mid LAD, diffuse distal disease, circumflex irregularities, large dominant RCA with a 50% ostial, 70% distal, 60% posterolateral and 70% PDA; normal EF  . Cerebrovascular disease 2010    R. carotid endarterectomy; Duplex in 10/2010-widely patent ICAs, subtotal left vertebral-not thought to be contributing to symptoms  . Hyperlipidemia   . Obesity   . Tobacco abuse     Quit 2014  . Benign prostatic hypertrophy   . Low back pain   . History of PSVT (paroxysmal supraventricular tachycardia) 2005    Diagnosed with ILR; no recurrence following RFA  . Cervical spine disease     CT in 2012-advanced degeneration and spondylosis with moderate spinal stenosis at C3-C6  . H/O: substance abuse     Cocaine, marijuana, alcohol.  Quit 2013.   [name] Syncope   . Gastroesophageal reflux disease   . Depression   . Erectile dysfunction   . Coronary artery disease   . Myocardial infarction   .  Anginal pain   . Shortness of breath   . CHF (congestive heart failure)   . H/O hiatal hernia   . Headache(784.0)   . Arthritis   . Carotid artery occlusion   . Pericardial effusion without cardiac tamponade 12/03/2013  . Non-ST elevation myocardial infarction (NSTEMI), initial episode of care 12/02/2013    DES LAD  . Thyroid disease     ROS:   All systems reviewed and negative except as noted in the HPI.   Past Surgical History  Procedure Laterality Date  . Radiofrequency ablation  2005    for PSVT  . Coronary angioplasty with stent placement  12/03/2013    LAD 90%-->0% W/ Promus Premier DES 3.0 mm x 16 mm, CFX OK, RCA 40%, EF 70-75%  . Carotid endarterectomy Right Feb. 25, 2010     CEA   . Trudee Kuster hole Right 04/13/2014    Procedure: Haskell Flirt;  Surgeon: Charlie Pitter, MD;  Location: [facility]URO ORS;  Service: Neurosurgery;  Laterality: Right;  . Left heart catheterization with coronary angiogram Left 12/03/2013    Procedure: LEFT HEART CATHETERIZATION WITH CORONARY ANGIOGRAM;  Surgeon: Leonie Man, MD;  Location: [facility] CATH LAB;  Service: Cardiovascular;  Laterality: Left;  . Percutaneous coronary stent intervention (pci-s)  12/03/2013    Procedure: PERCUTANEOUS CORONARY STENT INTERVENTION (PCI-S);  Surgeon: Leonie Man, MD;  Location: [facility] CATH LAB;  Service: Cardiovascular;;  . Left heart catheterization with coronary angiogram N/A 01/26/2014    Procedure: LEFT HEART CATHETERIZATION WITH CORONARY ANGIOGRAM;  Surgeon: Jettie Booze, MD;  Location: [facility] CATH LAB;  Service: Cardiovascular;  Laterality: N/A;     Family History  Problem Relation Age of Onset  . Hypertension Mother     Cerebrovascular disease  . Diabetes Mother   . Coronary artery disease Father 27  . Diabetes type II Father   . Hypertension Father   . Heart attack Father   . Lung cancer Paternal Uncle   . Diabetes Sister   . Hypertension Sister   . Heart attack Sister 50  . Diabetes Brother   . Hypertension Brother      History   Social History  . Marital Status: Married    Spouse Name: N/A  . Number of Children: 0  . Years of Education: N/A   Occupational History  . Retired    Social History Main Topics  . Smoking status: Former Smoker -- 1.00 packs/day for 40 years    Types: Cigarettes    Start date: 10/20/1972    Quit date: 10/10/2012  . Smokeless tobacco: Never Used     Comment: Quit in May.   . Alcohol Use: No     Comment: former drinker-- sober since 2013.   . Drug Use: No     Comment: quit cocaine 10/2011  . Sexual Activity:    Partners: Female   Other Topics Concern  . Not on file   Social History Narrative   Lives in [place].     BP 102/68 mmHg  Pulse 72  Ht 6'  (1.829 m)  Wt 297 lb (134.718 kg)  BMI 40.27 kg/m2  SpO2 97%  Physical Exam:  Well appearing middle aged man, looks older than stated age, NAD HEENT: Unremarkable Neck:  7 cm JVD, no thyromegally Back:  No CVA tenderness Lungs:  Clear except for basilar rales HEART:  Regular rate rhythm, no murmurs, no rubs, no clicks Abd:  soft, positive bowel sounds, no organomegally, no rebound,  no guarding Ext:  2 plus pulses, no edema, no cyanosis, no clubbing Skin:  No rashes no nodules Neuro:  CN II through XII intact, motor grossly intact    Assess/Plan:

## 2014-07-29 NOTE — Patient Instructions (Signed)
Stop Verapamil  Stop Metoprolol   Your physician has recommended that you wear an event monitor. Event monitors are medical devices that record the heart's electrical activity. Doctors most often [place] these monitors to diagnose arrhythmias. Arrhythmias are problems with the speed or rhythm of the heartbeat. The monitor is a small, portable device. You can wear one while you do your normal daily activities. This is usually used to diagnose what is causing palpitations/syncope (passing out).  [place]  You have been referred back to Dr Lovena Le  Your physician recommends that you schedule a follow-up appointment in: 2 months

## 2014-07-29 NOTE — Assessment & Plan Note (Signed)
He will continue his current meds. His on plavix but not a good candidate for both plavix and coumadin.

## 2014-07-29 NOTE — Assessment & Plan Note (Signed)
His symptoms are class 2A and better than they have been in a while. He will continue his current medical regimen and I have asked him to reduce his sodium intake.

## 2014-07-29 NOTE — Progress Notes (Signed)
Patient ID: Peter Dunlap, male   DOB: 08/13/1955, 59 y.o.   MRN: [id number] [employer] 30 day cardiac event monitor applied to patient.

## 2014-07-29 NOTE — Patient Instructions (Addendum)
Your physician recommends that you schedule a follow-up appointment in:  3 months    Your physician recommends that you continue on your current medications as directed. Please refer to the Current Medication list given to you today.     Thank you for choosing [facility] !   

## 2014-07-31 ENCOUNTER — Inpatient Hospital Stay (HOSPITAL_COMMUNITY)
Admission: EM | Admit: 2014-07-31 | Discharge: 2014-08-04 | DRG: 287 | Disposition: A | Discharge status: Home / Self Care | Payer: Medicare Other | Attending: Cardiology | Admitting: Cardiology

## 2014-07-31 ENCOUNTER — Telehealth (HOSPITAL_COMMUNITY): Payer: Self-pay | Admitting: Vascular Surgery

## 2014-07-31 ENCOUNTER — Emergency Department (HOSPITAL_COMMUNITY): Payer: Medicare Other

## 2014-07-31 ENCOUNTER — Encounter (HOSPITAL_COMMUNITY): Payer: Self-pay | Admitting: *Deleted

## 2014-07-31 DIAGNOSIS — N189 Chronic kidney disease, unspecified: Secondary | ICD-10-CM

## 2014-07-31 DIAGNOSIS — Z7902 Long term (current) use of antithrombotics/antiplatelets: Secondary | ICD-10-CM | POA: Diagnosis not present

## 2014-07-31 DIAGNOSIS — E785 Hyperlipidemia, unspecified: Secondary | ICD-10-CM | POA: Diagnosis present

## 2014-07-31 DIAGNOSIS — I129 Hypertensive chronic kidney disease with stage 1 through stage 4 chronic kidney disease, or unspecified chronic kidney disease: Secondary | ICD-10-CM | POA: Diagnosis present

## 2014-07-31 DIAGNOSIS — I5032 Chronic diastolic (congestive) heart failure: Secondary | ICD-10-CM

## 2014-07-31 DIAGNOSIS — N400 Enlarged prostate without lower urinary tract symptoms: Secondary | ICD-10-CM | POA: Diagnosis present

## 2014-07-31 DIAGNOSIS — I4892 Unspecified atrial flutter: Secondary | ICD-10-CM | POA: Diagnosis present

## 2014-07-31 DIAGNOSIS — Z7982 Long term (current) use of aspirin: Secondary | ICD-10-CM

## 2014-07-31 DIAGNOSIS — G4730 Sleep apnea, unspecified: Secondary | ICD-10-CM | POA: Diagnosis present

## 2014-07-31 DIAGNOSIS — R079 Chest pain, unspecified: Secondary | ICD-10-CM | POA: Diagnosis present

## 2014-07-31 DIAGNOSIS — Z955 Presence of coronary angioplasty implant and graft: Secondary | ICD-10-CM | POA: Diagnosis not present

## 2014-07-31 DIAGNOSIS — R001 Bradycardia, unspecified: Secondary | ICD-10-CM | POA: Diagnosis present

## 2014-07-31 DIAGNOSIS — I480 Paroxysmal atrial fibrillation: Secondary | ICD-10-CM | POA: Diagnosis present

## 2014-07-31 DIAGNOSIS — N183 Chronic kidney disease, stage 3 (moderate): Secondary | ICD-10-CM | POA: Diagnosis present

## 2014-07-31 DIAGNOSIS — E039 Hypothyroidism, unspecified: Secondary | ICD-10-CM | POA: Diagnosis present

## 2014-07-31 DIAGNOSIS — Z87891 Personal history of nicotine dependence: Secondary | ICD-10-CM | POA: Diagnosis not present

## 2014-07-31 DIAGNOSIS — I2510 Atherosclerotic heart disease of native coronary artery without angina pectoris: Secondary | ICD-10-CM | POA: Diagnosis present

## 2014-07-31 DIAGNOSIS — T461X5A Adverse effect of calcium-channel blockers, initial encounter: Secondary | ICD-10-CM | POA: Diagnosis present

## 2014-07-31 DIAGNOSIS — I252 Old myocardial infarction: Secondary | ICD-10-CM

## 2014-07-31 DIAGNOSIS — I495 Sick sinus syndrome: Secondary | ICD-10-CM | POA: Diagnosis present

## 2014-07-31 HISTORY — DX: Sleep apnea, unspecified: G47.30

## 2014-07-31 LAB — CBC
HCT: 47.6 % (ref 39.0–52.0)
Hemoglobin: 16.8 g/dL (ref 13.0–17.0)
MCH: 32.2 pg (ref 26.0–34.0)
MCHC: 35.3 g/dL (ref 30.0–36.0)
MCV: 91.2 fL (ref 78.0–100.0)
Platelets: 171 10*3/uL (ref 150–400)
RBC: 5.22 MIL/uL (ref 4.22–5.81)
RDW: 13.8 % (ref 11.5–15.5)
WBC: 11.6 10*3/uL — ABNORMAL HIGH (ref 4.0–10.5)

## 2014-07-31 LAB — I-STAT TROPONIN, ED: Troponin i, poc: 0.04 ng/mL (ref 0.00–0.08)

## 2014-07-31 LAB — APTT: aPTT: 38 seconds — ABNORMAL HIGH (ref 24–37)

## 2014-07-31 LAB — I-STAT CHEM 8, ED
BUN: 18 mg/dL (ref 6–23)
Calcium, Ion: 1.05 mmol/L — ABNORMAL LOW (ref 1.12–1.23)
Chloride: 102 mmol/L (ref 96–112)
Creatinine, Ser: 1.4 mg/dL — ABNORMAL HIGH (ref 0.50–1.35)
Glucose, Bld: 164 mg/dL — ABNORMAL HIGH (ref 70–99)
HCT: 56 % — ABNORMAL HIGH (ref 39.0–52.0)
Hemoglobin: 19 g/dL — ABNORMAL HIGH (ref 13.0–17.0)
Potassium: 5.3 mmol/L — ABNORMAL HIGH (ref 3.5–5.1)
Sodium: 139 mmol/L (ref 135–145)
TCO2: 24 mmol/L (ref 0–100)

## 2014-07-31 LAB — BASIC METABOLIC PANEL
Anion gap: 10 (ref 5–15)
BUN: 12 mg/dL (ref 6–23)
CO2: 23 mmol/L (ref 19–32)
Calcium: 9.3 mg/dL (ref 8.4–10.5)
Chloride: 103 mmol/L (ref 96–112)
Creatinine, Ser: 1.41 mg/dL — ABNORMAL HIGH (ref 0.50–1.35)
GFR calc Af Amer: 62 mL/min — ABNORMAL LOW (ref >90)
GFR calc non Af Amer: 53 mL/min — ABNORMAL LOW (ref >90)
Glucose, Bld: 163 mg/dL — ABNORMAL HIGH (ref 70–99)
Potassium: 4.1 mmol/L (ref 3.5–5.1)
Sodium: 136 mmol/L (ref 135–145)

## 2014-07-31 LAB — PROTIME-INR
INR: 1.13 (ref 0.00–1.49)
Prothrombin Time: 14.6 seconds (ref 11.6–15.2)

## 2014-07-31 LAB — BRAIN NATRIURETIC PEPTIDE: B Natriuretic Peptide: 89 pg/mL (ref 0.0–100.0)

## 2014-07-31 MED ORDER — ACETAMINOPHEN 500 MG PO TABS: 500.0000 mg | ORAL_TABLET | Freq: Four times a day (QID) | ORAL | Status: DC | PRN

## 2014-07-31 MED ORDER — ASPIRIN 81 MG PO CHEW
324.0000 mg | CHEWABLE_TABLET | Freq: Once | ORAL | Status: AC
  Administered 2014-07-31: 324 mg via ORAL
  Filled 2014-07-31: qty 4

## 2014-07-31 MED ORDER — CLOPIDOGREL BISULFATE 75 MG PO TABS
75.0000 mg | ORAL_TABLET | Freq: Every day | ORAL | Status: DC
  Administered 2014-08-01 – 2014-08-04 (×4): 75 mg via ORAL
  Filled 2014-07-31 (×4): qty 1

## 2014-07-31 MED ORDER — FUROSEMIDE 40 MG PO TABS
60.0000 mg | ORAL_TABLET | Freq: Every day | ORAL | Status: DC
  Administered 2014-08-01 – 2014-08-02 (×2): 60 mg via ORAL
  Filled 2014-07-31 (×3): qty 1

## 2014-07-31 MED ORDER — TRAZODONE 25 MG HALF TABLET
25.0000 mg | ORAL_TABLET | Freq: Every evening | ORAL | Status: DC | PRN
  Filled 2014-07-31: qty 2

## 2014-07-31 MED ORDER — SIMVASTATIN 40 MG PO TABS
40.0000 mg | ORAL_TABLET | Freq: Every day | ORAL | Status: DC
  Administered 2014-08-01 – 2014-08-04 (×4): 40 mg via ORAL
  Filled 2014-07-31 (×4): qty 1

## 2014-07-31 MED ORDER — HYDROCODONE-ACETAMINOPHEN 5-325 MG PO TABS
1.0000 | ORAL_TABLET | Freq: Four times a day (QID) | ORAL | Status: DC | PRN
  Administered 2014-08-01 – 2014-08-04 (×7): 1 via ORAL
  Filled 2014-07-31 (×7): qty 1

## 2014-07-31 MED ORDER — DILTIAZEM HCL 25 MG/5ML IV SOLN
20.0000 mg | Freq: Once | INTRAVENOUS | Status: AC
  Administered 2014-07-31: 20 mg via INTRAVENOUS
  Filled 2014-07-31: qty 5

## 2014-07-31 MED ORDER — SODIUM CHLORIDE 0.9 % IJ SOLN
3.0000 mL | INTRAMUSCULAR | Status: DC | PRN
  Administered 2014-08-01: 3 mL via INTRAVENOUS
  Filled 2014-07-31: qty 3

## 2014-07-31 MED ORDER — SODIUM CHLORIDE 0.9 % IV SOLN: 250.0000 mL | INTRAVENOUS | Status: DC | PRN

## 2014-07-31 MED ORDER — POTASSIUM CHLORIDE CRYS ER 20 MEQ PO TBCR
20.0000 meq | EXTENDED_RELEASE_TABLET | Freq: Every day | ORAL | Status: DC
  Administered 2014-08-01 – 2014-08-04 (×4): 20 meq via ORAL
  Filled 2014-07-31 (×6): qty 1

## 2014-07-31 MED ORDER — ONDANSETRON HCL 4 MG/2ML IJ SOLN: 4.0000 mg | Freq: Four times a day (QID) | INTRAMUSCULAR | Status: DC | PRN

## 2014-07-31 MED ORDER — NITROGLYCERIN 0.4 MG SL SUBL: 0.4000 mg | SUBLINGUAL_TABLET | SUBLINGUAL | Status: DC | PRN

## 2014-07-31 MED ORDER — PANTOPRAZOLE SODIUM 40 MG PO TBEC
40.0000 mg | DELAYED_RELEASE_TABLET | Freq: Every day | ORAL | Status: DC
  Administered 2014-08-01 – 2014-08-04 (×4): 40 mg via ORAL
  Filled 2014-07-31 (×4): qty 1

## 2014-07-31 MED ORDER — ISOSORBIDE MONONITRATE ER 60 MG PO TB24
60.0000 mg | ORAL_TABLET | Freq: Every day | ORAL | Status: DC
  Administered 2014-08-01 – 2014-08-04 (×4): 60 mg via ORAL
  Filled 2014-07-31 (×4): qty 1

## 2014-07-31 MED ORDER — DILTIAZEM HCL 100 MG IV SOLR: 5.0000 mg/h | Freq: Once | INTRAVENOUS | Status: DC

## 2014-07-31 MED ORDER — SODIUM CHLORIDE 0.9 % IJ SOLN
3.0000 mL | Freq: Two times a day (BID) | INTRAMUSCULAR | Status: DC
  Administered 2014-08-01 – 2014-08-04 (×6): 3 mL via INTRAVENOUS

## 2014-07-31 NOTE — Telephone Encounter (Signed)
Spoke w/Erica she states at 1:53 pm central time pt had a HR of 160 that lasted for about 30 sec, it was a-fib.  She states they called pt and he was asymptomatic other than he did have a feeling that his HR was racing briefly, she states now pt is still in a-fib with a rate of 100, she will fax strips

## 2014-07-31 NOTE — Telephone Encounter (Signed)
[employer] called to report a abnormal EKG .Peter Dunlap PLEASE ADVISE

## 2014-07-31 NOTE — ED Provider Notes (Signed)
CSN: [id number]     Arrival date & time 07/31/14  1752 History   First MD Initiated Contact with Patient 07/31/14 1810     Chief Complaint  Patient presents with  . Chest Pain  . Shortness of Breath     (Consider location/radiation/quality/duration/timing/severity/associated sxs/prior Treatment) HPI Comments: The patient is a 59 year old male with a known history of significant coronary artery disease, he had a non-ST elevation MI in June 2015, had unstable angina 2 months later and had a resultant stent again in August, he then was placed on Coumadin and had a intracranial hemorrhage in November. He was discharged from the hospital after brain surgery on Plavix and has done well on that anticoagulation. Since that time the patient has done fairly well, he was seen in the cardiology office 2 days ago. At that time he was noted to be in an irregularly irregular rhythm consistent with what is thought to be chronic atrial fibrillation. He was placed on an event monitor, beta blocker and calcium channel blockers were stopped, the patient states that he frequently feels intermittent palpitations but states that it only lasts for a couple of minutes and usually resolves by itself. Today while he was driving in the car he felt acute onset of chest pain and shortness of breath, felt like his heart was racing, this has been continual, nothing makes this better or worse, the symptoms are severe, not associated with fevers chills swelling of the lower extremities abdominal pain back pain nausea or vomiting.  Patient is a 59 y.o. male presenting with chest pain and shortness of breath. The history is provided by the patient and medical records.  Chest Pain Associated symptoms: shortness of breath   Shortness of Breath Associated symptoms: chest pain     Past Medical History  Diagnosis Date  . Hypertension     Severe LVH with normal EF  . Arteriosclerotic cardiovascular disease (ASCVD) 2005     catheterization in 10/2010:50% mid LAD, diffuse distal disease, circumflex irregularities, large dominant RCA with a 50% ostial, 70% distal, 60% posterolateral and 70% PDA; normal EF  . Cerebrovascular disease 2010    R. carotid endarterectomy; Duplex in 10/2010-widely patent ICAs, subtotal left vertebral-not thought to be contributing to symptoms  . Hyperlipidemia   . Obesity   . Tobacco abuse     Quit 2014  . Benign prostatic hypertrophy   . Low back pain   . History of PSVT (paroxysmal supraventricular tachycardia) 2005    Diagnosed with ILR; no recurrence following RFA  . Cervical spine disease     CT in 2012-advanced degeneration and spondylosis with moderate spinal stenosis at C3-C6  . H/O: substance abuse     Cocaine, marijuana, alcohol.  Quit 2013.   [name] Syncope   . Gastroesophageal reflux disease   . Depression   . Erectile dysfunction   . Coronary artery disease   . Myocardial infarction   . Anginal pain   . Shortness of breath   . CHF (congestive heart failure)   . H/O hiatal hernia   . Headache(784.0)   . Arthritis   . Carotid artery occlusion   . Pericardial effusion without cardiac tamponade 12/03/2013  . Non-ST elevation myocardial infarction (NSTEMI), initial episode of care 12/02/2013    DES LAD  . Thyroid disease    Past Surgical History  Procedure Laterality Date  . Radiofrequency ablation  2005    for PSVT  . Coronary angioplasty with stent placement  12/03/2013  LAD 90%-->0% W/ Promus Premier DES 3.0 mm x 16 mm, CFX OK, RCA 40%, EF 70-75%  . Carotid endarterectomy Right Feb. 25, 2010     CEA  . Trudee Kuster hole Right 04/13/2014    Procedure: Haskell Flirt;  Surgeon: Charlie Pitter, MD;  Location: [facility];  Service: Neurosurgery;  Laterality: Right;  . Left heart catheterization with coronary angiogram Left 12/03/2013    Procedure: LEFT HEART CATHETERIZATION WITH CORONARY ANGIOGRAM;  Surgeon: Leonie Man, MD;  Location: [facility] CATH LAB;  Service: Cardiovascular;   Laterality: Left;  . Percutaneous coronary stent intervention (pci-s)  12/03/2013    Procedure: PERCUTANEOUS CORONARY STENT INTERVENTION (PCI-S);  Surgeon: Leonie Man, MD;  Location: [facility] C[facility] LAB;  Service: Cardiovascular;;  . Left heart catheterization with coronary angiogram N/A 01/26/2014    Procedure: LEFT HEART CATHETERIZATION WITH CORONARY ANGIOGRAM;  Surgeon: Jettie Booze, MD;  Location: [facility] CATH LAB;  Service: Cardiovascular;  Laterality: N/A;   Family History  Problem Relation Age of Onset  . Hypertension Mother     Cerebrovascular disease  . Diabetes Mother   . Coronary artery disease Father 56  . Diabetes type II Father   . Hypertension Father   . Heart attack Father   . Lung cancer Paternal Uncle   . Diabetes Sister   . Hypertension Sister   . Heart attack Sister 69  . Diabetes Brother   . Hypertension Brother    History  Substance Use Topics  . Smoking status: Former Smoker -- 1.00 packs/day for 40 years    Types: Cigarettes    Start date: 10/20/1972    Quit date: 10/10/2012  . Smokeless tobacco: Never Used     Comment: Quit in May.   . Alcohol Use: No     Comment: former drinker-- sober since 2013.     Review of Systems  Respiratory: Positive for shortness of breath.   Cardiovascular: Positive for chest pain.  All other systems reviewed and are negative.     Allergies  Lactose intolerance (gi)  Home Medications   Prior to Admission medications   Medication Sig Start Date End Date Taking? Authorizing Provider  acetaminophen (TYLENOL) 500 MG tablet Take 500 mg by mouth every 6 (six) hours as needed for moderate pain.    Historical Provider, MD  clopidogrel (PLAVIX) 75 MG tablet Take 1 tablet (75 mg total) by mouth daily. 04/17/14   Charlie Pitter, MD  colchicine 0.6 MG tablet Take 1 tablet (0.6 mg total) by mouth 2 (two) times daily as needed. 05/26/14   [name], MD  esomeprazole (NEXIUM) 40 MG capsule Take 1 capsule (40 mg total) by mouth  daily at 12 noon. 04/15/14   [name], MD  furosemide (LASIX) 20 MG tablet Take 3 tablets (60 mg total) by mouth daily. 03/04/14   [name], MD  HYDROcodone-acetaminophen (NORCO/VICODIN) 5-325 MG per tablet Take 1 tablet by mouth every 6 (six) hours as needed for moderate pain. 05/26/14   [name], MD  isosorbide mononitrate (IMDUR) 60 MG 24 hr tablet Take 1 tablet (60 mg total) by mouth daily. 02/16/14   Burtis Junes, NP  nitroGLYCERIN (NITROSTAT) 0.4 MG SL tablet Place 1 tablet (0.4 mg total) under the tongue every 5 (five) minutes as needed for chest pain. 12/05/13   Rhonda G Barrett, PA-C  potassium chloride SA (K-DUR,KLOR-CON) 20 MEQ tablet TAKE 1 TABLET BY MOUTH DAILY 06/03/14   [name],  MD  RAPAFLO 8 MG CAPS capsule Take 8 mg by mouth daily.  10/21/13   Historical Provider, MD  simvastatin (ZOCOR) 40 MG tablet Take 40 mg by mouth daily.     Historical Provider, MD  traZODone (DESYREL) 50 MG tablet Take 0.5-1 tablets (25-50 mg total) by mouth at bedtime as needed for sleep. 07/24/14   [name], MD   BP 96/69 mmHg  Pulse 90  Temp(Src) 97.2 F (36.2 C) (Oral)  Resp 17  SpO2 100% Physical Exam  Constitutional: He appears well-developed and well-nourished. He appears distressed.  HENT:  Head: Normocephalic and atraumatic.  Mouth/Throat: Oropharynx is clear and moist. No oropharyngeal exudate.  Eyes: Conjunctivae and EOM are normal. Pupils are equal, round, and reactive to light. Right eye exhibits no discharge. Left eye exhibits no discharge. No scleral icterus.  Neck: Normal range of motion. Neck supple. No JVD present. No thyromegaly present.  Cardiovascular: Normal heart sounds and intact distal pulses.  Exam reveals no gallop and no friction rub.   No murmur heard. Tachycardia present, irregularly irregular rhythm, strong pulses at the radial artery, no JVD  Pulmonary/Chest: Effort normal and breath sounds normal. No respiratory distress. He has no  wheezes. He has no rales.  Abdominal: Soft. Bowel sounds are normal. He exhibits no distension and no mass. There is no tenderness.  Musculoskeletal: Normal range of motion. He exhibits no edema or tenderness.  Lymphadenopathy:    He has no cervical adenopathy.  Neurological: He is alert. Coordination normal.  Skin: Skin is warm and dry. No rash noted. No erythema.  Psychiatric: He has a normal mood and affect. His behavior is normal.  Nursing note and vitals reviewed.   ED Course  Procedures (including critical care time) Labs Review Labs Reviewed  CBC - Abnormal; Notable for the following:    WBC 11.6 (*)    All other components within normal limits  BASIC METABOLIC PANEL - Abnormal; Notable for the following:    Glucose, Bld 163 (*)    Creatinine, Ser 1.41 (*)    GFR calc non Af Amer 53 (*)    GFR calc Af Amer 62 (*)    All other components within normal limits  APTT - Abnormal; Notable for the following:    aPTT 38 (*)    All other components within normal limits  I-STAT CHEM 8, ED - Abnormal; Notable for the following:    Potassium 5.3 (*)    Creatinine, Ser 1.40 (*)    Glucose, Bld 164 (*)    Calcium, Ion 1.05 (*)    Hemoglobin 19.0 (*)    HCT 56.0 (*)    All other components within normal limits  BRAIN NATRIURETIC PEPTIDE  PROTIME-INR  I-STAT TROPOININ, ED    Imaging Review Dg Chest Portable 1 View  07/31/2014   CLINICAL DATA:  Centralized chest pain and shortness of breath since this morning.  EXAM: PORTABLE CHEST - 1 VIEW  COMPARISON:  04/10/2014  FINDINGS: Cardiac enlargement without vascular congestion. No focal airspace disease or consolidation in the lungs. No blunting of costophrenic angles. No pneumothorax. Degenerative changes in the shoulders. Surgical clips in the base of the neck.  IMPRESSION: Cardiac enlargement.  No evidence of active pulmonary disease.   Electronically Signed   By: Lucienne Capers M.D.   On: 07/31/2014 18:36     EKG  Interpretation   Date/Time:  Friday July 31 2014 17:56:43 EST Ventricular Rate:  141 PR Interval:  QRS Duration: 84 QT Interval:  308 QTC Calculation: 471 R Axis:   39 Text Interpretation:  Atrial flutter with variable A-V block with  premature ventricular or aberrantly conducted complexes Possible Anterior  infarct , age undetermined Inferior injury pattern Abnormal ekg Since last  tracing significant changes seen with recurrent rapid atrial rate.  Confirmed by Sabra Heck  MD, [place] ([id number]) on 07/31/2014 6:17:37 PM     EKG Interpretation  Date/Time:  Friday July 31 2014 18:59:10 EST Ventricular Rate:  114 PR Interval:    QRS Duration: 95 QT Interval:  379 QTC Calculation: 522 R Axis:   45 Text Interpretation:  Atrial fibrillation Abnormal T, consider ischemia, diffuse leads Prolonged QT interval some beats with no p waves (ectopic), some with P waves and some appear flutter. Abnormal ekg Since last tracing changes present. Confirmed by MILLER  MD, [place] ([id number]) on 07/31/2014 7:01:51 PM      7:02 PM Pt seen again - after cardizem has changed rhythm, still has CP and palpitations, ECG as above - will d/w cardiology.  MDM   Final diagnoses:  Atrial flutter with rapid ventricular response  Chest pain, unspecified chest pain type    The patient has vital signs which are consistent with atrial flutter with variable block based on the EKG. Compared to prior EKG S is slightly different as it looks like it was A. fib in the past. The patient will need rate control, evaluation for ischemia, chest x-ray, cardiology consultation. He is already taking Plavix, we'll have him take aspirin, anticipate heparin.  At the least this is related to his rapid ventricular rate, would also consider ischemia, possible unstable angina given his vasculopathic history. The patient is a critically ill. He will require continuous titration of rate control.  The patient was given Cardizem bolus, this  caused a significant bradycardia with a bizarre rhythm afterwards, this was partially flutter, partially junctional, the decision was made not to use a constant Cardizem drip, blood pressures remained stable if not slightly low, troponin is normal. I discussed with Dr. Jacinta Shoe who will admit the patient to the cardiology service.    CRITICAL CARE Performed by: Johnna Acosta Total critical care time: 35 Critical care time was exclusive of separately billable procedures and treating other patients. Critical care was necessary to treat or prevent imminent or life-threatening deterioration. Critical care was time spent personally by me on the following activities: development of treatment plan with patient and/or surrogate as well as nursing, discussions with consultants, evaluation of patient's response to treatment, examination of patient, obtaining history from patient or surrogate, ordering and performing treatments and interventions, ordering and review of laboratory studies, ordering and review of radiographic studies, pulse oximetry and re-evaluation of patient's condition.   Meds given in ED:  Medications  nitroGLYCERIN (NITROSTAT) SL tablet 0.4 mg (not administered)  diltiazem (CARDIZEM) 100 mg in dextrose 5 % 100 mL (1 mg/mL) infusion (5 mg/hr Intravenous Pending 07/31/14 1857)  aspirin chewable tablet 324 mg (324 mg Oral Given 07/31/14 1850)  diltiazem (CARDIZEM) injection 20 mg (20 mg Intravenous New Bag/Given 07/31/14 1851)      Johnna Acosta, MD 07/31/14 [phone]

## 2014-07-31 NOTE — ED Notes (Signed)
Pt reports onset today of mid chest pains that radiates into left arm and having sob. Took 2 nitro pta with no relief.

## 2014-07-31 NOTE — ED Notes (Signed)
Pt taken back to room from triage due to concerning EKG and HR.

## 2014-07-31 NOTE — ED Notes (Signed)
Transporting patient to new room assignment. 

## 2014-07-31 NOTE — ED Notes (Signed)
Hold cardizem per dr. Sabra Heck

## 2014-07-31 NOTE — H&P (Signed)
CARDIOLOGY ADMISSION NOTE  Patient ID: Peter Dunlap MRN: [id number] DOB/AGE: [birth year] 59 y.o.  Admit date: 07/31/2014 Primary Physician   [name], MD Primary Cardiologist   Dr. Lovena Le, Dr. Haroldine Laws.   Chief Complaint    Palpitations and chest pain.   HPI:  The patient has a complicated medical history with CAD, PVD, SVT status post ablation, atrial fib, and pericardial effusion and HF with preserved EF.  His last cath in 01/2014 demonstrated patent LAD stents, 80% proximal OM1 stenosis treated with DES and hyperdynamic EF.  Echo in Sept only demonstrated a small pericardial effusion.  He was admitted on  04/13/2014 with a subdural hematoma requiring evacuation.  At that time he was on ASA and warfarin.  He was discharged on Plavix only.   He saw Dr. Lovena Le a couple of days ago and he had dizziness with presyncope.  He is wearing an event monitor to evaluate intermittent sinus arrest requiring discontinuation of beta blocker and verapamil.  This had been noted in the HF clinic.  He had been referred there to evaluate his diastolic HF with severe LVH, mitral SAM and LVOT gradient.    He was admitted today chest pain and SOB.  He also has felt a racing heart rate.   He reports that he has had dizzy spells for months.  He has had palpitations.  However, today was the first day he had chest pain.  This was substernal and like his previous angina but not nearly as bad.  He had no radiation and no associated symptoms.  He did take SLNTG.   He was noted to be in atrial flutter with rapid rate.  He was treated with ASA and IV Cardizem.  Troponin is negative.  BNP was WNL.  He did have some bradycardia in the ED with intermittent sinus beats and then converted to NSR.  He is pain free.   Past Medical History  Diagnosis Date  . Hypertension     Severe LVH with normal EF  . Arteriosclerotic cardiovascular disease (ASCVD) 2005    catheterization in 10/2010:50% mid LAD, diffuse distal disease,  circumflex irregularities, large dominant RCA with a 50% ostial, 70% distal, 60% posterolateral and 70% PDA; normal EF  . Cerebrovascular disease 2010    R. carotid endarterectomy; Duplex in 10/2010-widely patent ICAs, subtotal left vertebral-not thought to be contributing to symptoms  . Hyperlipidemia   . Obesity   . Tobacco abuse     Quit 2014  . Benign prostatic hypertrophy   . Low back pain   . History of PSVT (paroxysmal supraventricular tachycardia) 2005    Diagnosed with ILR; no recurrence following RFA  . Cervical spine disease     CT in 2012-advanced degeneration and spondylosis with moderate spinal stenosis at C3-C6  . H/O: substance abuse     Cocaine, marijuana, alcohol.  Quit 2013.   [name] Syncope   . Gastroesophageal reflux disease   . Depression   . Erectile dysfunction   . CHF (congestive heart failure)   . H/O hiatal hernia   . Headache(784.0)   . Arthritis   . Pericardial effusion without cardiac tamponade 12/03/2013  . Non-ST elevation myocardial infarction (NSTEMI), initial episode of care 12/02/2013    DES LAD  . Thyroid disease   . Sleep apnea     CPAP    Past Surgical History  Procedure Laterality Date  . Radiofrequency ablation  2005    for PSVT  . Coronary angioplasty with  stent placement  12/03/2013    LAD 90%-->0% W/ Promus Premier DES 3.0 mm x 16 mm, CFX OK, RCA 40%, EF 70-75%  . Carotid endarterectomy Right Feb. 25, 2010     CEA  . Trudee Kuster hole Right 04/13/2014    Procedure: Haskell Flirt;  Surgeon: Charlie Pitter, MD;  Location: [place] [facility];  Service: Neurosurgery;  Laterality: Right;  . Left heart catheterization with coronary angiogram Left 12/03/2013    Procedure: LEFT HEART CATHETERIZATION WITH CORONARY ANGIOGRAM;  Surgeon: Leonie Man, MD;  Location: [facility] CATH LAB;  Service: Cardiovascular;  Laterality: Left;  . Percutaneous coronary stent intervention (pci-s)  12/03/2013    Procedure: PERCUTANEOUS CORONARY STENT INTERVENTION (PCI-S);  Surgeon: Leonie Man, MD;  Location: [facility] CATH LAB;  Service: Cardiovascular;;  . Left heart catheterization with coronary angiogram N/A 01/26/2014    Procedure: LEFT HEART CATHETERIZATION WITH CORONARY ANGIOGRAM;  Surgeon: Jettie Booze, MD;  Location: [facility] CATH LAB;  Service: Cardiovascular;  Laterality: N/A;    Allergies  Allergen Reactions  . Lactose Intolerance (Gi) Other (See Comments)    UPSET STOMACH    No current facility-administered medications on file prior to encounter.   Current Outpatient Prescriptions on File Prior to Encounter  Medication Sig Dispense Refill  . acetaminophen (TYLENOL) 500 MG tablet Take 500 mg by mouth every 6 (six) hours as needed for moderate pain.    [name] clopidogrel (PLAVIX) 75 MG tablet Take 1 tablet (75 mg total) by mouth daily. 30 tablet 5  . colchicine 0.6 MG tablet Take 1 tablet (0.6 mg total) by mouth 2 (two) times daily as needed. 14 tablet 1  . esomeprazole (NEXIUM) 40 MG capsule Take 1 capsule (40 mg total) by mouth daily at 12 noon. 30 capsule 3  . furosemide (LASIX) 20 MG tablet Take 3 tablets (60 mg total) by mouth daily. 90 tablet 3  . HYDROcodone-acetaminophen (NORCO/VICODIN) 5-325 MG per tablet Take 1 tablet by mouth every 6 (six) hours as needed for moderate pain. 60 tablet 0  . nitroGLYCERIN (NITROSTAT) 0.4 MG SL tablet Place 1 tablet (0.4 mg total) under the tongue every 5 (five) minutes as needed for chest pain. 25 tablet 12  . potassium chloride SA (K-DUR,KLOR-CON) 20 MEQ tablet TAKE 1 TABLET BY MOUTH DAILY 30 tablet 3  . simvastatin (ZOCOR) 40 MG tablet Take 40 mg by mouth daily.     . traZODone (DESYREL) 50 MG tablet Take 0.5-1 tablets (25-50 mg total) by mouth at bedtime as needed for sleep. 30 tablet 3  . isosorbide mononitrate (IMDUR) 60 MG 24 hr tablet Take 1 tablet (60 mg total) by mouth daily. (Patient not taking: Reported on 07/31/2014) 90 tablet 3   History   Social History  . Marital Status: Married    Spouse Name: N/A  . Number of  Children: 0  . Years of Education: N/A   Occupational History  . Retired    Social History Main Topics  . Smoking status: Former Smoker -- 1.00 packs/day for 40 years    Types: Cigarettes    Start date: 10/20/1972    Quit date: 10/10/2012  . Smokeless tobacco: Never Used     Comment: Quit in May.   . Alcohol Use: No     Comment: former drinker-- sober since 2013.   . Drug Use: No     Comment: quit cocaine 10/2011  . Sexual Activity:    Partners: Female   Other Topics Concern  .  Not on file   Social History Narrative   Lives in [place].    Family History  Problem Relation Age of Onset  . Hypertension Mother     Cerebrovascular disease  . Diabetes Mother   . Coronary artery disease Father 67  . Diabetes type II Father   . Hypertension Father   . Heart attack Father   . Lung cancer Paternal Uncle   . Diabetes Sister   . Hypertension Sister   . Heart attack Sister 20  . Diabetes Brother   . Hypertension Brother      ROS:  Weight gain, back pain.  Otherwise as stated in the HPI and negative for all other systems.  Physical Exam: Blood pressure 119/63, pulse 90, temperature 97.2 F (36.2 C), temperature source Oral, resp. rate 13, SpO2 100 %.  GENERAL:  Well appearing HEENT:  Pupils equal round and reactive, fundi not visualized, oral mucosa unremarkable, EDENTULOUS NECK:  No jugular venous distention, waveform within normal limits, carotid upstroke brisk and symmetric, no bruits, no thyromegaly LYMPHATICS:  No cervical, inguinal adenopathy LUNGS:  Clear to auscultation bilaterally BACK:  No CVA tenderness CHEST:  Unremarkable HEART:  PMI not displaced or sustained,S1 and S2 within normal limits, no S3, no S4, no clicks, no rubs, 3/6 brief apical systolic murmur, no diastolic murmurs ABD:  Flat, positive bowel sounds normal in frequency in pitch, no bruits, no rebound, no guarding, no midline pulsatile mass, no hepatomegaly, no splenomegaly EXT:  2 plus pulses  throughout, no edema, no cyanosis no clubbing SKIN:  No rashes no nodules NEURO:  Cranial nerves II through XII grossly intact, motor grossly intact throughout PSYCH:  Cognitively intact, oriented to person place and time   Labs: Lab Results  Component Value Date   BUN 18 07/31/2014   Lab Results  Component Value Date   CREATININE 1.40* 07/31/2014   Lab Results  Component Value Date   NA 139 07/31/2014   K 5.3* 07/31/2014   CL 102 07/31/2014   CO2 23 07/31/2014   Lab Results  Component Value Date   TROPONINI <0.30 04/10/2014   Lab Results  Component Value Date   WBC 11.6* 07/31/2014   HGB 19.0* 07/31/2014   HCT 56.0* 07/31/2014   MCV 91.2 07/31/2014   PLT 171 07/31/2014   Lab Results  Component Value Date   CHOL 155 07/27/2014   HDL 48 07/27/2014   LDLCALC 87 07/27/2014   TRIG 100 07/27/2014   CHOLHDL 3.2 07/27/2014   Lab Results  Component Value Date   ALT 10 07/27/2014   AST 17 07/27/2014   ALKPHOS 91 07/27/2014   BILITOT 0.8 07/27/2014      Radiology:  CXR:  Cardiac enlargement without vascular congestion. No focal airspace disease or consolidation in the lungs. No blunting of costophrenic angles. No pneumothorax. Degenerative changes in the shoulders. Surgical clips in the base of the neck.  EKG:  Atrial fib, rate 110s, axis WNL, lateral T wave inversion.  T wave inversion was present on the 2/24 EKG but arrhythmia is new. 07/31/2014   ASSESSMENT AND PLAN:    ATRIAL FLUTTER/FIB:  He has tachybrady syndrome.  I suspect that he will need a pacemaker and resumption of his AV nodal blocking agents.    Mr. BRAYDON KULLMAN has a CHA2DS2 - VASc score of 2 with a risk of stroke of 2.2%    CAD:  She did have a DES.  However, it was suggested that Plavix could  stop after six months.  His pain seems to be associated with rapid rate.  I will continue Plavix until his enzymes have been cycled.  However, if these are negative I would suggest warfarin alone at  discharge for stroke prophylaxis given atrial fib.    CKD:  Creat is mildly elevated.  We can follow this.  HYPOTHYROID:  He had an elevated TSH in Sept.  He has had thyroid ablation.  We will check a TSH.       SignedMinus Breeding 07/31/2014, 9:07 PM

## 2014-08-01 ENCOUNTER — Encounter (HOSPITAL_COMMUNITY): Payer: Self-pay | Admitting: General Practice

## 2014-08-01 DIAGNOSIS — R7989 Other specified abnormal findings of blood chemistry: Secondary | ICD-10-CM

## 2014-08-01 DIAGNOSIS — I4892 Unspecified atrial flutter: Secondary | ICD-10-CM

## 2014-08-01 LAB — BASIC METABOLIC PANEL
Anion gap: 6 (ref 5–15)
BUN: 10 mg/dL (ref 6–23)
CO2: 30 mmol/L (ref 19–32)
Calcium: 9 mg/dL (ref 8.4–10.5)
Chloride: 102 mmol/L (ref 96–112)
Creatinine, Ser: 1.48 mg/dL — ABNORMAL HIGH (ref 0.50–1.35)
GFR calc Af Amer: 58 mL/min — ABNORMAL LOW (ref >90)
GFR calc non Af Amer: 50 mL/min — ABNORMAL LOW (ref >90)
Glucose, Bld: 107 mg/dL — ABNORMAL HIGH (ref 70–99)
Potassium: 3.5 mmol/L (ref 3.5–5.1)
Sodium: 138 mmol/L (ref 135–145)

## 2014-08-01 LAB — TROPONIN I
Troponin I: 0.08 ng/mL — ABNORMAL HIGH (ref <0.031)
Troponin I: 0.1 ng/mL — ABNORMAL HIGH (ref <0.031)
Troponin I: 0.15 ng/mL — ABNORMAL HIGH (ref <0.031)

## 2014-08-01 LAB — HEPARIN LEVEL (UNFRACTIONATED): Heparin Unfractionated: 0.46 IU/mL (ref 0.30–0.70)

## 2014-08-01 LAB — TSH: TSH: 11.309 u[IU]/mL — ABNORMAL HIGH (ref 0.350–4.500)

## 2014-08-01 MED ORDER — LEVOTHYROXINE SODIUM 25 MCG PO TABS
25.0000 ug | ORAL_TABLET | Freq: Every day | ORAL | Status: DC
  Administered 2014-08-02 – 2014-08-04 (×3): 25 ug via ORAL
  Filled 2014-08-01 (×4): qty 1

## 2014-08-01 MED ORDER — HEPARIN BOLUS VIA INFUSION
4000.0000 [IU] | Freq: Once | INTRAVENOUS | Status: AC
  Administered 2014-08-01: 4000 [IU] via INTRAVENOUS
  Filled 2014-08-01: qty 4000

## 2014-08-01 MED ORDER — ASPIRIN EC 81 MG PO TBEC
81.0000 mg | DELAYED_RELEASE_TABLET | Freq: Every day | ORAL | Status: DC
  Administered 2014-08-01 – 2014-08-02 (×2): 81 mg via ORAL
  Filled 2014-08-01 (×4): qty 1

## 2014-08-01 MED ORDER — HEPARIN (PORCINE) IN NACL 100-0.45 UNIT/ML-% IJ SOLN
1550.0000 [IU]/h | INTRAMUSCULAR | Status: DC
  Administered 2014-08-01 – 2014-08-02 (×2): 1550 [IU]/h via INTRAVENOUS
  Filled 2014-08-01 (×7): qty 250

## 2014-08-01 NOTE — Progress Notes (Addendum)
Subjective: No CP  No SOB  No palpitations    Objective: Filed Vitals:   07/31/14 2140 07/31/14 2214 08/01/14 0457 08/01/14 1044  BP: 103/62 109/70 107/66 133/71  Pulse: 84 84 74 85  Temp:  98.3 F (36.8 C) 97.9 F (36.6 C) 98 F (36.7 C)  TempSrc:  Tympanic Oral Oral  Resp: 20 18 19 18   Height:  6' (1.829 m)    Weight:  290 lb 4.8 oz (131.679 kg)    SpO2: 100% 100% 97% 100%   Weight change:   Intake/Output Summary (Last 24 hours) at 08/01/14 1146 Last data filed at 08/01/14 1049  Gross per 24 hour  Intake    240 ml  Output    550 ml  Net   -310 ml    General: Alert, awake, oriented x3, in no acute distress Neck:  JVP is normal Heart: Regular rate and rhythm, without murmurs, rubs, gallops.  Lungs: Clear to auscultation.  No rales or wheezes. Exemities:  No edema.   Neuro: Grossly intact, nonfocal.  Tele:  SR  Lab Results: Results for orders placed or performed during the hospital encounter of 07/31/14 (from the past 24 hour(s))  CBC     Status: Abnormal   Collection Time: 07/31/14  6:15 PM  Result Value Ref Range   WBC 11.6 (H) 4.0 - 10.5 K/uL   RBC 5.22 4.22 - 5.81 MIL/uL   Hemoglobin 16.8 13.0 - 17.0 g/dL   HCT 47.6 39.0 - 52.0 %   MCV 91.2 78.0 - 100.0 fL   MCH 32.2 26.0 - 34.0 pg   MCHC 35.3 30.0 - 36.0 g/dL   RDW 13.8 11.5 - 15.5 %   Platelets 171 150 - 400 K/uL  Basic metabolic panel     Status: Abnormal   Collection Time: 07/31/14  6:15 PM  Result Value Ref Range   Sodium 136 135 - 145 mmol/L   Potassium 4.1 3.5 - 5.1 mmol/L   Chloride 103 96 - 112 mmol/L   CO2 23 19 - 32 mmol/L   Glucose, Bld 163 (H) 70 - 99 mg/dL   BUN 12 6 - 23 mg/dL   Creatinine, Ser 1.41 (H) 0.50 - 1.35 mg/dL   Calcium 9.3 8.4 - 10.5 mg/dL   GFR calc non Af Amer 53 (L) >90 mL/min   GFR calc Af Amer 62 (L) >90 mL/min   Anion gap 10 5 - 15  BNP (order ONLY if patient complains of dyspnea/SOB AND you have documented it for THIS visit)     Status: None   Collection Time:  07/31/14  6:15 PM  Result Value Ref Range   B Natriuretic Peptide 89.0 0.0 - 100.0 pg/mL  APTT     Status: Abnormal   Collection Time: 07/31/14  6:15 PM  Result Value Ref Range   aPTT 38 (H) 24 - 37 seconds  Protime-INR     Status: None   Collection Time: 07/31/14  6:15 PM  Result Value Ref Range   Prothrombin Time 14.6 11.6 - 15.2 seconds   INR 1.13 0.00 - 1.49  I-stat troponin, ED (not at [facility])     Status: None   Collection Time: 07/31/14  6:34 PM  Result Value Ref Range   Troponin i, poc 0.04 0.00 - 0.08 ng/mL   Comment 3          I-stat chem 8, ed     Status: Abnormal   Collection Time: 07/31/14  6:36 PM  Result Value Ref Range   Sodium 139 135 - 145 mmol/L   Potassium 5.3 (H) 3.5 - 5.1 mmol/L   Chloride 102 96 - 112 mmol/L   BUN 18 6 - 23 mg/dL   Creatinine, Ser 1.40 (H) 0.50 - 1.35 mg/dL   Glucose, Bld 164 (H) 70 - 99 mg/dL   Calcium, Ion 1.05 (L) 1.12 - 1.23 mmol/L   TCO2 24 0 - 100 mmol/L   Hemoglobin 19.0 (H) 13.0 - 17.0 g/dL   HCT 56.0 (H) 39.0 - 52.0 %  TSH     Status: Abnormal   Collection Time: 07/31/14 11:04 PM  Result Value Ref Range   TSH 11.309 (H) 0.350 - 4.500 uIU/mL  Troponin I-(serum)     Status: Abnormal   Collection Time: 07/31/14 11:04 PM  Result Value Ref Range   Troponin I 0.08 (H) <0.031 ng/mL  Troponin I-(serum)     Status: Abnormal   Collection Time: 08/01/14  3:20 AM  Result Value Ref Range   Troponin I 0.10 (H) <0.031 ng/mL  Basic metabolic panel     Status: Abnormal   Collection Time: 08/01/14  3:20 AM  Result Value Ref Range   Sodium 138 135 - 145 mmol/L   Potassium 3.5 3.5 - 5.1 mmol/L   Chloride 102 96 - 112 mmol/L   CO2 30 19 - 32 mmol/L   Glucose, Bld 107 (H) 70 - 99 mg/dL   BUN 10 6 - 23 mg/dL   Creatinine, Ser 1.48 (H) 0.50 - 1.35 mg/dL   Calcium 9.0 8.4 - 10.5 mg/dL   GFR calc non Af Amer 50 (L) >90 mL/min   GFR calc Af Amer 58 (L) >90 mL/min   Anion gap 6 5 - 15    Studies/Results: Dg Chest Portable 1 View  07/31/2014    CLINICAL DATA:  Centralized chest pain and shortness of breath since this morning.  EXAM: PORTABLE CHEST - 1 VIEW  COMPARISON:  04/10/2014  FINDINGS: Cardiac enlargement without vascular congestion. No focal airspace disease or consolidation in the lungs. No blunting of costophrenic angles. No pneumothorax. Degenerative changes in the shoulders. Surgical clips in the base of the neck.  IMPRESSION: Cardiac enlargement.  No evidence of active pulmonary disease.   Electronically Signed   By: Lucienne Capers M.D.   On: 07/31/2014 18:36    Medications: Reviewed     @PROBHOSP @  1.  Afib/flutter  Curr remains in SR  No CP  I have reviewed patients course with G Lovena Le   Would recomm ambulating  Follow HR  Symptoms  May need a pacer but does not think he needs to stay in [place].  If feels OK can go home  Adjust thyroid supplement as this may have contrib to rhythm  Needs coumadin  2. CAD  Peak trop so far 0.1  Curr asymptomatic in SR  FollowContinue plavix  No ASA     3.  CKD  4.  Hypothyroidism  TSH 11.3  Add synthroid 25 mcg  Patinet says he was taking  ? In past  Does not appear to be on now by home meds  Poss d/c later today if feels ok with outpt appt with Beckie Salts next couple wks.      LOS: 1 day   Dorris Carnes 08/01/2014, 11:46 AM  Addendum:  Patient walked in hall  HR went to 110s  He did develop some chest pressure with this. ON talking to him he says that over  past month or so he has had intermitt chest pressure with exertion.  Prior to that he had felt great.  Was walking without problem  I cannot explain todays symptoms by poor HR response or by tachyarrhythmia.  Concern for coronary ischemia    I have reviewed with Beckie Salts  Would recomm L heart cath to redefine anatomy  Patient understands  Agrees to proceed  Plan for Monday.  Will continue asa 81 mg  Heparin for now.  Hold coumadin Continue tele    Dorris Carnes

## 2014-08-01 NOTE — Progress Notes (Addendum)
ANTICOAGULATION CONSULT NOTE - Initial Consult  Pharmacy Consult for Heparin Indication: chest pain/ACS  Allergies  Allergen Reactions  . Lactose Intolerance (Gi) Other (See Comments)    UPSET STOMACH     Patient Measurements: Height: 6' (182.9 cm) Weight: 290 lb 4.8 oz (131.679 kg) IBW/kg (Calculated) : 77.6 Heparin Dosing Weight: 108 kg  Vital Signs: Temp: 98 F (36.7 C) (02/27 1044) Temp Source: Oral (02/27 1044) BP: 133/71 mmHg (02/27 1044) Pulse Rate: 85 (02/27 1044)  Labs:  Recent Labs  07/31/14 1815 07/31/14 1836 07/31/14 2304 08/01/14 0320 08/01/14 1055  HGB 16.8 19.0*  --   --   --   HCT 47.6 56.0*  --   --   --   PLT 171  --   --   --   --   APTT 38*  --   --   --   --   LABPROT 14.6  --   --   --   --   INR 1.13  --   --   --   --   CREATININE 1.41* 1.40*  --  1.48*  --   TROPONINI  --   --  0.08* 0.10* 0.15*    Estimated Creatinine Clearance: 76.3 mL/min (by C-G formula based on Cr of 1.48).   Medical History: Past Medical History  Diagnosis Date  . Hypertension     Severe LVH with normal EF  . Arteriosclerotic cardiovascular disease (ASCVD) 2005    catheterization in 10/2010:50% mid LAD, diffuse distal disease, circumflex irregularities, large dominant RCA with a 50% ostial, 70% distal, 60% posterolateral and 70% PDA; normal EF  . Cerebrovascular disease 2010    R. carotid endarterectomy; Duplex in 10/2010-widely patent ICAs, subtotal left vertebral-not thought to be contributing to symptoms  . Hyperlipidemia   . Obesity   . Tobacco abuse     Quit 2014  . Benign prostatic hypertrophy   . Low back pain   . History of PSVT (paroxysmal supraventricular tachycardia) 2005    Diagnosed with ILR; no recurrence following RFA  . Cervical spine disease     CT in 2012-advanced degeneration and spondylosis with moderate spinal stenosis at C3-C6  . H/O: substance abuse     Cocaine, marijuana, alcohol.  Quit 2013.   [name] Syncope   . Gastroesophageal  reflux disease   . Depression   . Erectile dysfunction   . CHF (congestive heart failure)   . H/O hiatal hernia   . Headache(784.0)   . Arthritis   . Pericardial effusion without cardiac tamponade 12/03/2013  . Non-ST elevation myocardial infarction (NSTEMI), initial episode of care 12/02/2013    DES LAD  . Thyroid disease   . Sleep apnea     CPAP    Assessment: 66 YOM with prior cardiac history including PCI who presented to the [facility] on 2/26 with CP/SOB and was found to have Afib-tachy brady syndrome with CHADS2-VASC score of 2. No anticoagulation was started on admission however while ambulating today the patient was noted to have recurrent CP and then admitted to have intermittent CP over the past month. Pharmacy was consulted to start heparin for anticoagulation while awaiting cardiac cath for evaluation on Mon, 2/29.  Of noting - the patient has been known to require higher rates of heparin and has been therapeutic at rates between 2300-2500 units/hr in a prior admission in Aug 2015.  Goal of Therapy:  Heparin level 0.3-0.7 units/ml Monitor platelets by anticoagulation protocol: Yes  Plan:  1. Heparin bolus of 4000 units x 1 2. Initiate heparin at a drip rate of 1550 units/hr (15.5 ml/hr) 3. Will continue to monitor for any signs/symptoms of bleeding and will follow up with heparin level in 6 hours   [name], PharmD, BCPS Clinical Pharmacist Pager: [phone] 08/01/2014 2:42 PM    -------------------------------------------------------------------------------------------------------------- Addendum:  Heparin level this evening came back as therapeutic (HL 0.46, goal of 0.3-0.7). No overt s/sx of bleeding from RN report.  Plan 1. Continue heparin at 1550 units/hr (15.5 ml/hr) 2. Will continue to monitor for any signs/symptoms of bleeding and will follow up with heparin level in the a.m.   [name], PharmD, BCPS Clinical Pharmacist Pager:  [phone] 08/01/2014 10:14 PM

## 2014-08-01 NOTE — Progress Notes (Signed)
Ambulated with pt 521ft in hallways. Pt denies SOB, slight chest discomfort, pressure but voices relieve when at rest in room. Pt HR went up between 90's and 110's. Dr. Harrington Challenger on unit notified. Francis Gaines Kuffour RN.

## 2014-08-02 DIAGNOSIS — R079 Chest pain, unspecified: Secondary | ICD-10-CM

## 2014-08-02 LAB — HEPARIN LEVEL (UNFRACTIONATED): Heparin Unfractionated: 0.55 IU/mL (ref 0.30–0.70)

## 2014-08-02 LAB — T4, FREE
Free T4: 0.63 ng/dL — ABNORMAL LOW (ref 0.80–1.80)
Free T4: 0.56 ng/dL — ABNORMAL LOW (ref 0.80–1.80)

## 2014-08-02 NOTE — Progress Notes (Signed)
ANTICOAGULATION CONSULT NOTE - Follow-up Consult  Pharmacy Consult for Heparin Indication: chest pain/ACS  Allergies  Allergen Reactions  . Lactose Intolerance (Gi) Other (See Comments)    UPSET STOMACH     Patient Measurements: Height: 6' (182.9 cm) Weight: 290 lb 4.8 oz (131.679 kg) IBW/kg (Calculated) : 77.6 Heparin Dosing Weight: 108 kg  Vital Signs: Temp: 98.2 F (36.8 C) (02/28 0557) Temp Source: Oral (02/28 0557) BP: 114/73 mmHg (02/28 1004) Pulse Rate: 85 (02/28 1004)  Labs:  Recent Labs  07/31/14 1815 07/31/14 1836 07/31/14 2304 08/01/14 0320 08/01/14 1055 08/01/14 2138 08/02/14 0237  HGB 16.8 19.0*  --   --   --   --   --   HCT 47.6 56.0*  --   --   --   --   --   PLT 171  --   --   --   --   --   --   APTT 38*  --   --   --   --   --   --   LABPROT 14.6  --   --   --   --   --   --   INR 1.13  --   --   --   --   --   --   HEPARINUNFRC  --   --   --   --   --  0.46 0.55  CREATININE 1.41* 1.40*  --  1.48*  --   --   --   TROPONINI  --   --  0.08* 0.10* 0.15*  --   --     Estimated Creatinine Clearance: 76.3 mL/min (by C-G formula based on Cr of 1.48).  Assessment: 48 YOM with prior cardiac history including PCI who presented to the [facility] on 2/26 with CP/SOB and was found to have Afib-tachy brady syndrome with CHADS2-VASC score of 2. No anticoagulation was started on admission however while ambulating today the patient was noted to have recurrent CP and then admitted to have intermittent CP over the past month. Troponin up to 0.15. Pt on therapeutic heparin gtt (level 0.55). No CBC done this a.m. No bleeding noted. Plan for cath tomorrow a.m. Noted plan for coumadin post-cath.  Goal of Therapy:  Heparin level 0.3-0.7 units/ml Monitor platelets by anticoagulation protocol: Yes   Plan:  Heparin at 1550 units/hr (15.5 ml/hr) F/u daily CBC, heparin level  Sherlon Handing, PharmD, BCPS Clinical pharmacist, pager [phone] 08/02/2014 1:54 PM

## 2014-08-02 NOTE — Progress Notes (Signed)
   Subjective: One episode dizziness yesterday while sitting  Not sure what time  No SOB or CP  Has not done much  Objective: Filed Vitals:   08/01/14 1434 08/01/14 2025 08/02/14 0557 08/02/14 1004  BP: 116/65 111/73 99/52 114/73  Pulse: 95 84 72 85  Temp: 98.5 F (36.9 C) 97.9 F (36.6 C) 98.2 F (36.8 C)   TempSrc: Oral Oral Oral   Resp: 19 20 18    Height:      Weight:      SpO2: 99% 99% 99%    Weight change:   Intake/Output Summary (Last 24 hours) at 08/02/14 1029 Last data filed at 08/02/14 0900  Gross per 24 hour  Intake    720 ml  Output    850 ml  Net   -130 ml    General: Alert, awake, oriented x3, in no acute distress Neck:  JVP is normal Heart: Regular rate and rhythm, without murmurs, rubs, gallops.  Lungs: Clear to auscultation.  No rales or wheezes. Exemities:  No edema.   Neuro: Grossly intact, nonfocal.  TEle:  SR  One short burst of atrial fib   Lab Results: Results for orders placed or performed during the hospital encounter of 07/31/14 (from the past 24 hour(s))  Troponin I-(serum)     Status: Abnormal   Collection Time: 08/01/14 10:55 AM  Result Value Ref Range   Troponin I 0.15 (H) <0.031 ng/mL  Heparin level (unfractionated)     Status: None   Collection Time: 08/01/14  9:38 PM  Result Value Ref Range   Heparin Unfractionated 0.46 0.30 - 0.70 IU/mL  Heparin level (unfractionated)     Status: None   Collection Time: 08/02/14  2:37 AM  Result Value Ref Range   Heparin Unfractionated 0.55 0.30 - 0.70 IU/mL    Studies/Results: No results found.  Medications:  Reviewed    @PROBHOSP @  1  Atrial fib/flutter  Continue tele Will need to be on a po anticoag after cath  Coumadin    2.  CAD  Plan for cath given continue SOB and chest pressure that is new over past several wks  3.  CKD  Follow closely  4.  Hypothyroidism  Panel drawn for labs  Synthroid started.    LOS: 2 days   Dorris Carnes 08/02/2014, 10:29 AM

## 2014-08-03 ENCOUNTER — Encounter (HOSPITAL_COMMUNITY): Payer: Self-pay | Admitting: Cardiovascular Disease

## 2014-08-03 ENCOUNTER — Telehealth: Payer: Self-pay | Admitting: Internal Medicine

## 2014-08-03 ENCOUNTER — Encounter (HOSPITAL_COMMUNITY): Payer: Medicaid Other

## 2014-08-03 ENCOUNTER — Encounter (HOSPITAL_COMMUNITY)
Admission: EM | Disposition: A | Discharge status: Home / Self Care | Payer: Self-pay | Source: Home / Self Care | Attending: Cardiology

## 2014-08-03 DIAGNOSIS — I2510 Atherosclerotic heart disease of native coronary artery without angina pectoris: Secondary | ICD-10-CM

## 2014-08-03 DIAGNOSIS — N183 Chronic kidney disease, stage 3 (moderate): Secondary | ICD-10-CM

## 2014-08-03 HISTORY — PX: LEFT HEART CATHETERIZATION WITH CORONARY ANGIOGRAM: SHX5451

## 2014-08-03 LAB — CBC
HCT: 45.5 % (ref 39.0–52.0)
Hemoglobin: 15.9 g/dL (ref 13.0–17.0)
MCH: 32.8 pg (ref 26.0–34.0)
MCHC: 34.9 g/dL (ref 30.0–36.0)
MCV: 93.8 fL (ref 78.0–100.0)
Platelets: 154 10*3/uL (ref 150–400)
RBC: 4.85 MIL/uL (ref 4.22–5.81)
RDW: 14.2 % (ref 11.5–15.5)
WBC: 8 10*3/uL (ref 4.0–10.5)

## 2014-08-03 LAB — POCT ACTIVATED CLOTTING TIME
Activated Clotting Time: 208 seconds
Activated Clotting Time: 239 seconds
Activated Clotting Time: 184 seconds

## 2014-08-03 LAB — T3, FREE: T3, Free: 2.1 pg/mL (ref 2.0–4.4)

## 2014-08-03 LAB — HEPARIN LEVEL (UNFRACTIONATED): Heparin Unfractionated: 0.63 IU/mL (ref 0.30–0.70)

## 2014-08-03 SURGERY — LEFT HEART CATHETERIZATION WITH CORONARY ANGIOGRAM
Anesthesia: LOCAL

## 2014-08-03 MED ORDER — OFF THE BEAT BOOK
Freq: Once | Status: AC
  Administered 2014-08-03: 08:00:00
  Filled 2014-08-03: qty 1

## 2014-08-03 MED ORDER — MIDAZOLAM HCL 2 MG/2ML IJ SOLN
INTRAMUSCULAR | Status: AC
  Filled 2014-08-03: qty 2

## 2014-08-03 MED ORDER — SODIUM CHLORIDE 0.9 % IJ SOLN: 3.0000 mL | INTRAMUSCULAR | Status: DC | PRN

## 2014-08-03 MED ORDER — SODIUM CHLORIDE 0.9 % IV SOLN: INTRAVENOUS | Status: AC

## 2014-08-03 MED ORDER — NITROGLYCERIN 1 MG/10 ML FOR IR/CATH LAB
INTRA_ARTERIAL | Status: AC
  Filled 2014-08-03: qty 10

## 2014-08-03 MED ORDER — ASPIRIN 81 MG PO CHEW
81.0000 mg | CHEWABLE_TABLET | ORAL | Status: AC
  Administered 2014-08-03: 81 mg via ORAL
  Filled 2014-08-03: qty 1

## 2014-08-03 MED ORDER — HEPARIN SODIUM (PORCINE) 1000 UNIT/ML IJ SOLN
INTRAMUSCULAR | Status: AC
  Filled 2014-08-03: qty 1

## 2014-08-03 MED ORDER — VERAPAMIL HCL 2.5 MG/ML IV SOLN
INTRAVENOUS | Status: AC
  Filled 2014-08-03: qty 2

## 2014-08-03 MED ORDER — LIDOCAINE HCL (PF) 1 % IJ SOLN
INTRAMUSCULAR | Status: AC
  Filled 2014-08-03: qty 30

## 2014-08-03 MED ORDER — FENTANYL CITRATE 0.05 MG/ML IJ SOLN
INTRAMUSCULAR | Status: AC
  Filled 2014-08-03: qty 2

## 2014-08-03 MED ORDER — HEPARIN (PORCINE) IN NACL 2-0.9 UNIT/ML-% IJ SOLN
INTRAMUSCULAR | Status: AC
  Filled 2014-08-03: qty 1000

## 2014-08-03 MED ORDER — SODIUM CHLORIDE 0.9 % IV SOLN: 250.0000 mL | INTRAVENOUS | Status: DC | PRN

## 2014-08-03 MED ORDER — SODIUM CHLORIDE 0.9 % IJ SOLN: 3.0000 mL | Freq: Two times a day (BID) | INTRAMUSCULAR | Status: DC

## 2014-08-03 MED ORDER — SODIUM CHLORIDE 0.9 % IV SOLN
INTRAVENOUS | Status: DC
  Administered 2014-08-03: 10:00:00 via INTRAVENOUS

## 2014-08-03 NOTE — Progress Notes (Signed)
Site area: rt groin Site Prior to Removal:  Level  0 Pressure Applied For: 20 minutes Manual:   yes Patient Status During Pull:  stable Post Pull Site:  Level 0 Post Pull Instructions Given:  yes Post Pull Pulses Present: yes Dressing Applied:  tegaderm Bedrest begins @ [place] Comments: no complications

## 2014-08-03 NOTE — Progress Notes (Signed)
ANTICOAGULATION CONSULT NOTE - Follow-up Consult  Pharmacy Consult for Heparin Indication: chest pain/ACS  Allergies  Allergen Reactions  . Lactose Intolerance (Gi) Other (See Comments)    UPSET STOMACH     Patient Measurements: Height: 6' (182.9 cm) Weight: 290 lb 4.8 oz (131.679 kg) IBW/kg (Calculated) : 77.6 Heparin Dosing Weight: 108 kg  Vital Signs: Temp: 98.6 F (37 C) (02/29 0500) Temp Source: Oral (02/29 0500) BP: 106/67 mmHg (02/29 0500) Pulse Rate: 79 (02/29 0500)  Labs:  Recent Labs  07/31/14 1815 07/31/14 1836 07/31/14 2304 08/01/14 0320 08/01/14 1055 08/01/14 2138 08/02/14 0237 08/03/14 0428  HGB 16.8 19.0*  --   --   --   --   --  15.9  HCT 47.6 56.0*  --   --   --   --   --  45.5  PLT 171  --   --   --   --   --   --  154  APTT 38*  --   --   --   --   --   --   --   LABPROT 14.6  --   --   --   --   --   --   --   INR 1.13  --   --   --   --   --   --   --   HEPARINUNFRC  --   --   --   --   --  0.46 0.55 0.63  CREATININE 1.41* 1.40*  --  1.48*  --   --   --   --   TROPONINI  --   --  0.08* 0.10* 0.15*  --   --   --     Estimated Creatinine Clearance: 76.3 mL/min (by C-G formula based on Cr of 1.48).  Assessment: 32 YOM with prior cardiac history including PCI who presented to the [facility] on 2/26 with CP/SOB and was found to have Afib-tachy brady syndrome with CHADS2-VASC score of 2. No anticoagulation was started on admission however while ambulating today the patient was noted to have recurrent CP and then admitted to have intermittent CP over the past month. Troponin up to 0.15. Pt on therapeutic heparin gtt (level 0.55). No CBC done this a.m. No bleeding noted. Plan for cath tomorrow a.m. Noted plan for coumadin post-cath.  HL remains thereapeutic at 0.63 on heparin 1550 units/hr. No bleeding is noted  Goal of Therapy:  Heparin level 0.3-0.7 units/ml Monitor platelets by anticoagulation protocol: Yes   Plan:  Heparin at 1550 units/hr (15.5  ml/hr) F/u daily CBC, heparin level  Andrey Cota. Diona Foley, PharmD Clinical Pharmacist Pager [phone]  08/03/2014 11:09 AM

## 2014-08-03 NOTE — Interval H&P Note (Signed)
History and Physical Interval Note:  08/03/2014 3:41 PM  [name]  has presented today for cardiac cath with the diagnosis of unstable angina. The various methods of treatment have been discussed with the patient and family. After consideration of risks, benefits and other options for treatment, the patient has consented to  Procedure(s): LEFT HEART CATHETERIZATION WITH CORONARY ANGIOGRAM (N/A) as a surgical intervention .  The patient's history has been reviewed, patient examined, no change in status, stable for surgery.  I have reviewed the patient's chart and labs.  Questions were answered to the patient's satisfaction.    Cath Lab Visit (complete for each Cath Lab visit)  Clinical Evaluation Leading to the Procedure:   ACS: Yes.    Non-ACS:    Anginal Classification: CCS III  Anti-ischemic medical therapy: Minimal Therapy (1 class of medications)  Non-Invasive Test Results: No non-invasive testing performed  Prior CABG: No previous CABG        MCALHANY,CHRISTOPHER

## 2014-08-03 NOTE — H&P (View-Only) (Signed)
    Subjective:  Denies CP or dyspnea   Objective:  Filed Vitals:   08/02/14 1004 08/02/14 1402 08/02/14 1943 08/03/14 0500  BP: 114/73 113/71 129/67 106/67  Pulse: 85 95 85 79  Temp:  98.2 F (36.8 C) 98 F (36.7 C) 98.6 F (37 C)  TempSrc:  Oral Oral Oral  Resp:  20 18   Height:      Weight:      SpO2:  98% 98% 98%    Intake/Output from previous day:  Intake/Output Summary (Last 24 hours) at 08/03/14 0910 Last data filed at 08/02/14 1800  Gross per 24 hour  Intake    240 ml  Output    300 ml  Net    -60 ml    Physical Exam: Physical exam: Well-developed well-nourished in no acute distress.  Skin is warm and dry.  HEENT is normal.  Neck is supple. Chest is clear to auscultation with normal expansion.  Cardiovascular exam is regular rate and rhythm.  Abdominal exam nontender or distended. No masses palpated. Extremities show no edema. neuro grossly intact    Lab Results: Basic Metabolic Panel:  Recent Labs  07/31/14 1815 07/31/14 1836 08/01/14 0320  NA 136 139 138  K 4.1 5.3* 3.5  CL 103 102 102  CO2 23  --  30  GLUCOSE 163* 164* 107*  BUN 12 18 10   CREATININE 1.41* 1.40* 1.48*  CALCIUM 9.3  --  9.0   CBC:  Recent Labs  07/31/14 1815 07/31/14 1836 08/03/14 0428  WBC 11.6*  --  8.0  HGB 16.8 19.0* 15.9  HCT 47.6 56.0* 45.5  MCV 91.2  --  93.8  PLT 171  --  154   Cardiac Enzymes:  Recent Labs  07/31/14 2304 08/01/14 0320 08/01/14 1055  TROPONINI 0.08* 0.10* 0.15*     Assessment/Plan:  1 chest pain-troponin minimally elevated. Patient is scheduled for cardiac catheterization today. Hold Lasix in anticipation of catheterization today. His renal function is mildly abnormal. Follow creatinine after procedure.  2 atrial flutter-patient remains in sinus rhythm. His AV nodal blocking agents are on hold as he may be having bradycardia mediated presyncope. Monitor is in place. There has been ongoing discussion concerning patient's  anticoagulation. The patient had a drug-eluting stent to his LAD in July and to his obtuse marginal in September. He had a subdural hematoma on the combination of aspirin, Plavix and Coumadin in November that required evacuation. I would be extremely hesitant to resume Coumadin at this point particularly since he needs to remain on antiplatelet therapy for his previously placed drug-eluting stents. We will therefore not resume Coumadin. Continue Plavix at discharge but discontinue aspirin if no further PCI necessary. While he is at risk for an embolic event with his history of atrial flutter and atrial fibrillation I feel the risk of anticoagulation outweighs the benefit at this point. 3 history of intracranial hemorrhage.  4 chronic stage III renal failure-hold Lasix prior catheterization. Follow renal function following procedure. 5 Coronary artery disease-continue Plavix and statin at discharge. 6 presyncope-monitor in place. 7 Hypothyroid-will need outpt fu  Kirk Ruths 08/03/2014, 9:10 AM

## 2014-08-03 NOTE — Progress Notes (Deleted)
[id number]  &.%cc coke po

## 2014-08-03 NOTE — Clinical Documentation Improvement (Signed)
Please indicate in documentation specific type of atrial fibrillation: Paroxysmal  Persistent  Chronic   Permanent   Sign & Symptoms:ED note:"in cardiologist office 2 days ago, noted to be in an irregularly irregular rhythm consistent with what is thought to be chronic atrial fibrillation. Diagnostics:  07/31/14 EKG:Interpretation:  Atrial fibrillation Abnormal T, consider ischemia, diffuse leads Prolonged QT interval some beats with no p waves (ectopic), some with P waves and some appear flutter H&P: wearing an event monitor to evaluate intermittent sinus arrest  ATRIAL FLUTTER/FIB: He has tachybrady syndrome. I suspect that he will need a pacemaker and resumption of his AV nodal blocking agents.  Treatment: IV cardizem once, Plavix 75mg ,  Aspirin 81mg , IV heparin 15.5 ml/hr. beta blocker and calcium channel blockers were stopped  Thank you,  Philippa Chester ,RN Clinical Documentation Specialist:  [facility] [employer]

## 2014-08-03 NOTE — Progress Notes (Signed)
Pt returned from cath lab. VSS. Radial and femoral site are level 0. Pt assisted to eat [place] sandwich. Pt instructed to continue to keep leg straight and neck flat. Bedside report given to night RN.

## 2014-08-03 NOTE — Progress Notes (Signed)
Pt has rested well throughout night.  He has been prepped for cardiac cath this am, and currently has 2 sites for IV usage.  Pt also has monitor on that looks like a Holter monitor.  Will continue to monitor pt and assist as needed

## 2014-08-03 NOTE — CV Procedure (Signed)
      Cardiac Catheterization Operative Report  Peter Dunlap [id number] 2/29/20164:40 PM [name], MD  Procedure Performed:  1. Left Heart Catheterization 2. Selective Coronary Angiography  Operator: Lauree Chandler, MD  Arterial access site:  Right radial artery and right femoral artery.   Indication:  59 yo male with PAF, CAD s/p prior stenting of OM and LAD, prior Intracranial hemorrhage admitted with chest pain. Troponin is mildly elevated.                                      Procedure Details: The risks, benefits, complications, treatment options, and expected outcomes were discussed with the patient. The patient and/or family concurred with the proposed plan, giving informed consent. The patient was brought to the cath lab after IV hydration was begun and oral premedication was given. The patient was further sedated with Versed and Fentanyl. The right wrist was assessed with a modified Allens test which was positive. The right wrist was prepped and draped in a sterile fashion. 1% lidocaine was used for local anesthesia. Using the modified Seldinger access technique, a 5 French sheath was placed in the right radial artery. 3 mg Verapamil was given through the sheath. 6000 units IV heparin was given. I was able to engage the RCA with the JR4 but I was unable to engage the left main from the right radial approach despite using multiple different catheters. I then prepped the right groin and placed a 5 French sheath in the right femoral artery. The left main was engaged with a JL4 catheter. A pigtail catheter was used to cross the aortic valve into the left ventricle. LV pressures were measured. NO LV gram was performed. The sheath was removed from the right radial artery and a Terumo hemostasis band was applied at the arteriotomy site on the right wrist.   There were no immediate complications. The patient was taken to the recovery area in stable condition.   Hemodynamic  Findings: Central aortic pressure: 141/84 Left ventricular pressure: 160/0/18  Angiographic Findings:  Left main: No obstructive disease.   Left Anterior Descending Artery: Large caliber vessel that courses to the apex. Diffuse 30% stenosis in the proximal and mid vessel. The mid stented segment is widely patent. There are several small caliber early diagonal branches. There is a moderate caliber diagonal branch that arises from the distal LAD. There is 30% stenosis in the proximal segment of the diagonal branch.   Circumflex Artery: Large caliber vessel with large obtuse marginal branch. The obtuse marginal branch has a patent stent with no restenosis.   Right Coronary Artery: Large dominant vessel with 30% stenosis in the proximal, mid and distal vessel. The posterolateral branch has ostial 60% stenosis, unchanged from last cath. The PDA has ostial 60% stenosis, unchanged from last cath.   Left Ventricular Angiogram: Deferred.   Impression: 1. Triple vessel CAD, stable with patent stents LAD and Circumflex.  2. No focal targets for PCI  Recommendations: Continue medical management of CAD.        Complications:  None. The patient tolerated the procedure well.

## 2014-08-03 NOTE — Progress Notes (Signed)
    Subjective:  Denies CP or dyspnea   Objective:  Filed Vitals:   08/02/14 1004 08/02/14 1402 08/02/14 1943 08/03/14 0500  BP: 114/73 113/71 129/67 106/67  Pulse: 85 95 85 79  Temp:  98.2 F (36.8 C) 98 F (36.7 C) 98.6 F (37 C)  TempSrc:  Oral Oral Oral  Resp:  20 18   Height:      Weight:      SpO2:  98% 98% 98%    Intake/Output from previous day:  Intake/Output Summary (Last 24 hours) at 08/03/14 0910 Last data filed at 08/02/14 1800  Gross per 24 hour  Intake    240 ml  Output    300 ml  Net    -60 ml    Physical Exam: Physical exam: Well-developed well-nourished in no acute distress.  Skin is warm and dry.  HEENT is normal.  Neck is supple. Chest is clear to auscultation with normal expansion.  Cardiovascular exam is regular rate and rhythm.  Abdominal exam nontender or distended. No masses palpated. Extremities show no edema. neuro grossly intact    Lab Results: Basic Metabolic Panel:  Recent Labs  07/31/14 1815 07/31/14 1836 08/01/14 0320  NA 136 139 138  K 4.1 5.3* 3.5  CL 103 102 102  CO2 23  --  30  GLUCOSE 163* 164* 107*  BUN 12 18 10   CREATININE 1.41* 1.40* 1.48*  CALCIUM 9.3  --  9.0   CBC:  Recent Labs  07/31/14 1815 07/31/14 1836 08/03/14 0428  WBC 11.6*  --  8.0  HGB 16.8 19.0* 15.9  HCT 47.6 56.0* 45.5  MCV 91.2  --  93.8  PLT 171  --  154   Cardiac Enzymes:  Recent Labs  07/31/14 2304 08/01/14 0320 08/01/14 1055  TROPONINI 0.08* 0.10* 0.15*     Assessment/Plan:  1 chest pain-troponin minimally elevated. Patient is scheduled for cardiac catheterization today. Hold Lasix in anticipation of catheterization today. His renal function is mildly abnormal. Follow creatinine after procedure.  2 atrial flutter-patient remains in sinus rhythm. His AV nodal blocking agents are on hold as he may be having bradycardia mediated presyncope. Monitor is in place. There has been ongoing discussion concerning patient's  anticoagulation. The patient had a drug-eluting stent to his LAD in July and to his obtuse marginal in September. He had a subdural hematoma on the combination of aspirin, Plavix and Coumadin in November that required evacuation. I would be extremely hesitant to resume Coumadin at this point particularly since he needs to remain on antiplatelet therapy for his previously placed drug-eluting stents. We will therefore not resume Coumadin. Continue Plavix at discharge but discontinue aspirin if no further PCI necessary. While he is at risk for an embolic event with his history of atrial flutter and atrial fibrillation I feel the risk of anticoagulation outweighs the benefit at this point. 3 history of intracranial hemorrhage.  4 chronic stage III renal failure-hold Lasix prior catheterization. Follow renal function following procedure. 5 Coronary artery disease-continue Plavix and statin at discharge. 6 presyncope-monitor in place. 7 Hypothyroid-will need outpt fu  Kirk Ruths 08/03/2014, 9:10 AM

## 2014-08-03 NOTE — Telephone Encounter (Signed)
°  1. Is this related to a heart monitor you are wearing?  (If the patient says no, please ask     if they are calling about ICD/pacemaker.) Yes  2. What is your issue?? Baxter Flattery from [facility] calling stating that they are having to take off pt's Holter monitor due to pt having cath done today. Wanting to let our office know so we would be aware that there will be discrepancies in the data and want to know if they need to call [employer]. Please call back and advise. (If the patient is calling for results of the heart monitor this     message should be sent to nurse.)

## 2014-08-03 NOTE — Progress Notes (Signed)
Medicare Important Message given? YES (If response is "NO", the following Medicare IM given date fields will be blank) Date Medicare IM given:08/03/2014 Medicare IM given by: COLE,ANGELA 

## 2014-08-04 ENCOUNTER — Other Ambulatory Visit: Payer: Self-pay | Admitting: Physician Assistant

## 2014-08-04 DIAGNOSIS — I4892 Unspecified atrial flutter: Secondary | ICD-10-CM | POA: Insufficient documentation

## 2014-08-04 DIAGNOSIS — N183 Chronic kidney disease, stage 3 unspecified: Secondary | ICD-10-CM

## 2014-08-04 DIAGNOSIS — R079 Chest pain, unspecified: Secondary | ICD-10-CM

## 2014-08-04 DIAGNOSIS — R072 Precordial pain: Secondary | ICD-10-CM

## 2014-08-04 LAB — BASIC METABOLIC PANEL
Anion gap: 11 (ref 5–15)
BUN: 12 mg/dL (ref 6–23)
CO2: 23 mmol/L (ref 19–32)
Calcium: 8.9 mg/dL (ref 8.4–10.5)
Chloride: 105 mmol/L (ref 96–112)
Creatinine, Ser: 1.37 mg/dL — ABNORMAL HIGH (ref 0.50–1.35)
GFR calc Af Amer: 64 mL/min — ABNORMAL LOW (ref >90)
GFR calc non Af Amer: 55 mL/min — ABNORMAL LOW (ref >90)
Glucose, Bld: 83 mg/dL (ref 70–99)
Potassium: 3.9 mmol/L (ref 3.5–5.1)
Sodium: 139 mmol/L (ref 135–145)

## 2014-08-04 LAB — MAGNESIUM: Magnesium: 1.9 mg/dL (ref 1.5–2.5)

## 2014-08-04 LAB — CBC
HCT: 41.3 % (ref 39.0–52.0)
Hemoglobin: 14.1 g/dL (ref 13.0–17.0)
MCH: 31.8 pg (ref 26.0–34.0)
MCHC: 34.1 g/dL (ref 30.0–36.0)
MCV: 93 fL (ref 78.0–100.0)
Platelets: 133 10*3/uL — ABNORMAL LOW (ref 150–400)
RBC: 4.44 MIL/uL (ref 4.22–5.81)
RDW: 14.3 % (ref 11.5–15.5)
WBC: 6.1 10*3/uL (ref 4.0–10.5)

## 2014-08-04 MED ORDER — LEVOTHYROXINE SODIUM 25 MCG PO TABS: 25.0000 ug | ORAL_TABLET | Freq: Every day | ORAL | Status: DC

## 2014-08-04 MED ORDER — FUROSEMIDE 20 MG PO TABS: 60.0000 mg | ORAL_TABLET | Freq: Every day | ORAL | Status: DC

## 2014-08-04 NOTE — Discharge Summary (Signed)
Physician Discharge Summary     Cardiologist:  Bensimhon/Taylor Patient ID: Peter Dunlap MRN: [id number] DOB/AGE: 1955-06-18 59 y.o.  Admit date: 07/31/2014 Discharge date: 08/04/2014  Admission Diagnoses:  Atrial Flutter,   Discharge Diagnoses:  Active Problems:   Atrial fibrillation   Chest pain   Atrial flutter with rapid ventricular response   nonsustained ventricular tachycardia X line chronic stage III renal failure   Hypothyroidism   Coronary artery disease   Presyncope   History of intracranial hemorrhage   Discharged Condition: stable  Hospital Course:   The patient has a complicated medical history with CAD, PVD, SVT status post ablation, atrial fib, and pericardial effusion and HF with preserved EF. His last cath in 01/2014 demonstrated patent LAD stents, 80% proximal OM1 stenosis treated with DES and hyperdynamic EF. Echo in Sept only demonstrated a small pericardial effusion. He was admitted on 04/13/2014 with a subdural hematoma requiring evacuation. At that time he was on ASA and warfarin. He was discharged on Plavix only. He saw Dr. Lovena Le a couple of days ago and he had dizziness with presyncope. He is wearing an event monitor to evaluate intermittent sinus arrest requiring discontinuation of beta blocker and verapamil. This had been noted in the HF clinic. He had been referred there to evaluate his diastolic HF with severe LVH, mitral SAM and LVOT gradient.   He was admitted today chest pain and SOB. He also has felt a racing heart rate. He reports that he has had dizzy spells for months. He has had palpitations. However, today was the first day he had chest pain. This was substernal and like his previous angina but not nearly as bad. He had no radiation and no associated symptoms. He did take SLNTG. He was noted to be in atrial flutter with rapid rate. He was treated with ASA and IV Cardizem. Troponin is negative. BNP was WNL. He did have some  bradycardia in the ED with intermittent sinus beats and then converted to NSR. He is pain free.  TSH was found to be 11.3. Synthroid was added at 25 g.  Patient underwent left heart catheterization which revealed three-vessel disease which was stable with patent stents in the LAD and circumflex. There were no focal targets for PCI. Medical management recommended.  The patient had a drug-eluting stent to his LAD in July and to his obtuse marginal in September. He had a subdural hematoma on the combination of aspirin, Plavix and Coumadin in November that required evacuation.  Patient will be continued on Plavix and statin but no aspirin or Coumadin. He was noted to have nonsustained ventricular tachycardia however, AV nodal blocking agents are on hold as he may be having bradycardia mediated presyncope.  He has a monitor in place.  Due to chronic kidney disease we'll recheck renal function and potassium on March 3. We'll resume Lasix on that day. He will need follow-up with his primary care provider regarding hypothyroidism for monitoring.  The patient was seen by Dr. Stanford Breed who felt he was stable for DC home.    Consults: None  Significant Diagnostic Studies:   Cardiac Catheterization Operative Report  Peter Dunlap [id number] 2/29/20164:40 PM [name], MD  Procedure Performed:  1. Left Heart Catheterization 2. Selective Coronary Angiography  Operator: Lauree Chandler, MD  Arterial access site: Right radial artery and right femoral artery.   Indication: 59 yo male with PAF, CAD s/p prior stenting of OM and LAD, prior Intracranial hemorrhage admitted with chest pain. Troponin  is mildly elevated.    Procedure Details: The risks, benefits, complications, treatment options, and expected outcomes were discussed with the patient. The patient and/or family concurred with the proposed plan, giving informed consent. The patient was brought to  the cath lab after IV hydration was begun and oral premedication was given. The patient was further sedated with Versed and Fentanyl. The right wrist was assessed with a modified Allens test which was positive. The right wrist was prepped and draped in a sterile fashion. 1% lidocaine was used for local anesthesia. Using the modified Seldinger access technique, a 5 French sheath was placed in the right radial artery. 3 mg Verapamil was given through the sheath. 6000 units IV heparin was given. I was able to engage the RCA with the JR4 but I was unable to engage the left main from the right radial approach despite using multiple different catheters. I then prepped the right groin and placed a 5 French sheath in the right femoral artery. The left main was engaged with a JL4 catheter. A pigtail catheter was used to cross the aortic valve into the left ventricle. LV pressures were measured. NO LV gram was performed. The sheath was removed from the right radial artery and a Terumo hemostasis band was applied at the arteriotomy site on the right wrist.   There were no immediate complications. The patient was taken to the recovery area in stable condition.   Hemodynamic Findings: Central aortic pressure: 141/84 Left ventricular pressure: 160/0/18  Angiographic Findings:  Left main: No obstructive disease.   Left Anterior Descending Artery: Large caliber vessel that courses to the apex. Diffuse 30% stenosis in the proximal and mid vessel. The mid stented segment is widely patent. There are several small caliber early diagonal branches. There is a moderate caliber diagonal branch that arises from the distal LAD. There is 30% stenosis in the proximal segment of the diagonal branch.   Circumflex Artery: Large caliber vessel with large obtuse marginal branch. The obtuse marginal branch has a patent stent with no restenosis.   Right Coronary Artery: Large dominant vessel with 30% stenosis in the proximal, mid  and distal vessel. The posterolateral branch has ostial 60% stenosis, unchanged from last cath. The PDA has ostial 60% stenosis, unchanged from last cath.   Left Ventricular Angiogram: Deferred.   Impression: 1. Triple vessel CAD, stable with patent stents LAD and Circumflex.  2. No focal targets for PCI  Recommendations: Continue medical management of CAD.    Complications: None. The patient tolerated the procedure well.   Treatments: See above  Discharge Exam: Blood pressure 135/61, pulse 72, temperature 98 F (36.7 C), temperature source Oral, resp. rate 18, height 6' (1.829 m), weight 290 lb 4.8 oz (131.679 kg), SpO2 100 %.  Disposition: 01-Home or Self Care      Discharge Instructions    Diet - low sodium heart healthy    Complete by:  As directed      Discharge instructions    Complete by:  As directed   1.  Monitor your weight every morning.  If you gain 3 pounds in 24 hours, or 5 pounds in a      week, call the office for instructions.  2.  Labs on August 06, 2014.  Come to the office to get blood drawn.     Increase activity slowly    Complete by:  As directed             Medication List  TAKE these medications        acetaminophen 500 MG tablet  Commonly known as:  TYLENOL  Take 500 mg by mouth every 6 (six) hours as needed for moderate pain.     clopidogrel 75 MG tablet  Commonly known as:  PLAVIX  Take 1 tablet (75 mg total) by mouth daily.     colchicine 0.6 MG tablet  Take 1 tablet (0.6 mg total) by mouth 2 (two) times daily as needed.     esomeprazole 40 MG capsule  Commonly known as:  NEXIUM  Take 1 capsule (40 mg total) by mouth daily at 12 noon.     furosemide 20 MG tablet  Commonly known as:  LASIX  Take 3 tablets (60 mg total) by mouth daily.     HYDROcodone-acetaminophen 5-325 MG per tablet  Commonly known as:  NORCO/VICODIN  Take 1 tablet by mouth every 6 (six) hours as needed for moderate pain.     isosorbide  mononitrate 60 MG 24 hr tablet  Commonly known as:  IMDUR  Take 1 tablet (60 mg total) by mouth daily.     levothyroxine 25 MCG tablet  Commonly known as:  SYNTHROID, LEVOTHROID  Take 1 tablet (25 mcg total) by mouth daily before breakfast.     nitroGLYCERIN 0.4 MG SL tablet  Commonly known as:  NITROSTAT  Place 1 tablet (0.4 mg total) under the tongue every 5 (five) minutes as needed for chest pain.     potassium chloride SA 20 MEQ tablet  Commonly known as:  K-DUR,KLOR-CON  TAKE 1 TABLET BY MOUTH DAILY     simvastatin 40 MG tablet  Commonly known as:  ZOCOR  Take 40 mg by mouth daily.     traZODone 50 MG tablet  Commonly known as:  DESYREL  Take 0.5-1 tablets (25-50 mg total) by mouth at bedtime as needed for sleep.       Follow-up Information    Follow up with Glori Bickers, MD.   Specialty:  Cardiology   Why:  The office will call you with the follo wup appt date and time   Contact information:   [place] [address] [phone]       Follow up with [name], MD.   Specialty:  Cardiology   Why:  The office will call you with the follo wup appt date and time   Contact information:   [address] [place] [address] [phone]      Greater than 30 minutes was spent completing the patient's discharge.    SignedTarri Fuller, [place] 08/04/2014, 2:48 PM

## 2014-08-04 NOTE — Progress Notes (Signed)
    Subjective:  Denies CP or dyspnea   Objective:  Filed Vitals:   08/03/14 1835 08/03/14 1900 08/03/14 2016 08/04/14 0504  BP: 120/83 145/82 133/114 119/71  Pulse: 69 64 49 75  Temp:  97.5 F (36.4 C) 98.3 F (36.8 C) 98 F (36.7 C)  TempSrc:  Oral Oral Oral  Resp: 12 16 18 17   Height:      Weight:      SpO2: 99% 100% 90% 98%    Intake/Output from previous day:  Intake/Output Summary (Last 24 hours) at 08/04/14 1022 Last data filed at 08/04/14 0845  Gross per 24 hour  Intake    240 ml  Output    100 ml  Net    140 ml    Physical Exam: Physical exam: Well-developed well-nourished in no acute distress.  Skin is warm and dry.  HEENT is normal.  Neck is supple. Chest is clear to auscultation with normal expansion.  Cardiovascular exam is regular rate and rhythm. 2/6 systolic murmur left sternal border. Abdominal exam nontender or distended. No masses palpated. Extremities show no edema. Right radial cath site with no hematoma. neuro grossly intact    Lab Results: Basic Metabolic Panel:  Recent Labs  08/04/14 0333  NA 139  K 3.9  CL 105  CO2 23  GLUCOSE 83  BUN 12  CREATININE 1.37*  CALCIUM 8.9  MG 1.9   CBC:  Recent Labs  08/03/14 0428 08/04/14 0500  WBC 8.0 6.1  HGB 15.9 14.1  HCT 45.5 41.3  MCV 93.8 93.0  PLT 154 133*   Cardiac Enzymes:  Recent Labs  08/01/14 1055  TROPONINI 0.15*     Assessment/Plan:  1 chest pain-no further symptoms. Catheterization results noted. Plan medical therapy. Discontinue aspirin. Given history of intracranial hemorrhage. Continue Plavix. Continue statin. 2 atrial flutter-patient remains in sinus rhythm. His AV nodal blocking agents are on hold as he may be having bradycardia mediated presyncope. Monitor is in place. There has been ongoing discussion concerning patient's anticoagulation. The patient had a drug-eluting stent to his LAD in July and to his obtuse marginal in September. He had a subdural  hematoma on the combination of aspirin, Plavix and Coumadin in November that required evacuation. I would be extremely hesitant to resume Coumadin at this point particularly since he needs to remain on antiplatelet therapy for his previously placed drug-eluting stents. We will therefore not resume Coumadin. Continue Plavix at discharge but discontinue aspirin if no further PCI necessary. While he is at risk for an embolic event with his history of atrial flutter and atrial fibrillation I feel the risk of anticoagulation outweighs the benefit at this point. Discussed with Dr. Lovena Le and he agrees as well. 3 history of intracranial hemorrhage.  4 chronic stage III renal failure-patient will need potassium and renal function checked on Thursday, March 3. Resume Lasix on that day as well. 5 Coronary artery disease-continue Plavix and statin at discharge. 6 presyncope-monitor in place. 7 Hypothyroid-will need outpt fu 8 nonsustained ventricular tachycardia-noted on telemetry. LV function is normal. Will not add a beta blocker given history of possible bradycardia mediated presyncope. There is plan for cardiac MRI in the future for cardiac risk stratification and possible hypertrophic obstructive cardiopathy. Discharge today in follow-up in CHF clinic and with Dr. Lovena Le. >30 min PA and physician time D2  Kirk Ruths 08/04/2014, 10:22 AM

## 2014-08-04 NOTE — Progress Notes (Signed)
Ambulated with pt 524ft in hallways; pt denies any pain or discomfort; Pt ambulated independently. Francis Gaines Kuffour RN.

## 2014-08-04 NOTE — Progress Notes (Signed)
Pt discharge education and instructions completed with pt; pt voices understanding and denies any questions. Pt IV and telemetry removed; both pt right wrist and groin remains clean, dry and intact, level 0. pt discharge home with family to transport him home. Pt transported off unit via wheelchair with belongings to the side. Francis Gaines Kuffour RN.

## 2014-08-10 ENCOUNTER — Telehealth: Payer: Self-pay | Admitting: Physician Assistant

## 2014-08-10 NOTE — Telephone Encounter (Signed)
    Patient has monitor. I was called by Cleophus Molt at [facility] who reported that patient converted from afib with NSR thad a 3.4 post conversion pause. He is maintaining NSR. Strips faxed to Dr. Haroldine Laws. Will forward to Dr. Lovena Le and Dr. Haroldine Laws.   Angelena Form PA-C  [facility]

## 2014-08-13 ENCOUNTER — Telehealth: Payer: Self-pay | Admitting: Family Medicine

## 2014-08-13 NOTE — Telephone Encounter (Signed)
Came home from Hospital.  They did not say anything to him about his Rapaflo medication.  He doesn't know whether he should continue to take or not??

## 2014-08-17 ENCOUNTER — Ambulatory Visit: Payer: Self-pay | Admitting: Internal Medicine

## 2014-08-17 DIAGNOSIS — Z5181 Encounter for therapeutic drug level monitoring: Secondary | ICD-10-CM

## 2014-08-17 DIAGNOSIS — I480 Paroxysmal atrial fibrillation: Secondary | ICD-10-CM

## 2014-08-17 NOTE — Telephone Encounter (Signed)
Okay to take Rapaflo

## 2014-08-19 ENCOUNTER — Ambulatory Visit (HOSPITAL_COMMUNITY)
Admission: RE | Admit: 2014-08-19 | Discharge: 2014-08-19 | Disposition: A | Discharge status: Home / Self Care | Payer: 59 | Source: Ambulatory Visit | Attending: Internal Medicine | Admitting: Internal Medicine

## 2014-08-19 VITALS — BP 102/68 | HR 95 | Wt 295.0 lb

## 2014-08-19 DIAGNOSIS — E0590 Thyrotoxicosis, unspecified without thyrotoxic crisis or storm: Secondary | ICD-10-CM | POA: Insufficient documentation

## 2014-08-19 DIAGNOSIS — I10 Essential (primary) hypertension: Secondary | ICD-10-CM | POA: Insufficient documentation

## 2014-08-19 DIAGNOSIS — Z7901 Long term (current) use of anticoagulants: Secondary | ICD-10-CM | POA: Insufficient documentation

## 2014-08-19 DIAGNOSIS — G4733 Obstructive sleep apnea (adult) (pediatric): Secondary | ICD-10-CM | POA: Diagnosis not present

## 2014-08-19 DIAGNOSIS — I481 Persistent atrial fibrillation: Secondary | ICD-10-CM | POA: Insufficient documentation

## 2014-08-19 DIAGNOSIS — Z79899 Other long term (current) drug therapy: Secondary | ICD-10-CM | POA: Insufficient documentation

## 2014-08-19 DIAGNOSIS — E785 Hyperlipidemia, unspecified: Secondary | ICD-10-CM | POA: Diagnosis not present

## 2014-08-19 DIAGNOSIS — Z7902 Long term (current) use of antithrombotics/antiplatelets: Secondary | ICD-10-CM | POA: Diagnosis not present

## 2014-08-19 DIAGNOSIS — R55 Syncope and collapse: Secondary | ICD-10-CM | POA: Insufficient documentation

## 2014-08-19 DIAGNOSIS — I422 Other hypertrophic cardiomyopathy: Secondary | ICD-10-CM | POA: Insufficient documentation

## 2014-08-19 DIAGNOSIS — I421 Obstructive hypertrophic cardiomyopathy: Secondary | ICD-10-CM | POA: Diagnosis not present

## 2014-08-19 DIAGNOSIS — M109 Gout, unspecified: Secondary | ICD-10-CM | POA: Diagnosis not present

## 2014-08-19 DIAGNOSIS — I5032 Chronic diastolic (congestive) heart failure: Secondary | ICD-10-CM

## 2014-08-19 DIAGNOSIS — K219 Gastro-esophageal reflux disease without esophagitis: Secondary | ICD-10-CM | POA: Diagnosis not present

## 2014-08-19 DIAGNOSIS — I6529 Occlusion and stenosis of unspecified carotid artery: Secondary | ICD-10-CM | POA: Diagnosis not present

## 2014-08-19 DIAGNOSIS — Z87891 Personal history of nicotine dependence: Secondary | ICD-10-CM | POA: Insufficient documentation

## 2014-08-19 DIAGNOSIS — I2510 Atherosclerotic heart disease of native coronary artery without angina pectoris: Secondary | ICD-10-CM | POA: Diagnosis not present

## 2014-08-19 DIAGNOSIS — E119 Type 2 diabetes mellitus without complications: Secondary | ICD-10-CM | POA: Diagnosis not present

## 2014-08-19 DIAGNOSIS — I495 Sick sinus syndrome: Secondary | ICD-10-CM | POA: Diagnosis not present

## 2014-08-19 NOTE — Patient Instructions (Signed)
Your physician has requested that you have a cardiac MRI. Cardiac MRI uses a computer to create images of your heart as its beating, producing both still and moving pictures of your heart and major blood vessels. For further information please visit [email or web address] Please follow the instruction sheet given to you today for more information.  WE WILL CALL TO SCHEDULE ONCE YOUR INSURANCE HAS APPROVED  You have been referred to follow up with Dr Ena Dawley  Follow up with Dr Lovena Le  Follow up with [place] as needed

## 2014-08-19 NOTE — Progress Notes (Signed)
Patient ID: Peter Dunlap, male   DOB: 22-Feb-1956, 59 y.o.   MRN: [id number] PCP: Dr. Buelah Manis EP: Dr. Lovena Le  59 yo with history of HOCM (versus LV changes from severe long-standing HTN), CAD, persistent atrial fibrillation, and chronic diastolic CHF presents for CHF clinic evaluation.  He was admitted in 6/15 with NSTEMI and had DES to mLAD.  He was admitted again in 8/15 with unstable angina and had DES to OM1.  At the time of the 6/15 admission, he was noted to be in atrial fibrillation, and it appears that this has been persistent since then.    He was admitted to [facility] with headache and had subdural hematoma. Hematoma was evacuated on 04/13/14. He was discharged on off coumadin and aspirin. He was discharged on plavix.   He returns for follow up. Last visit verapamil and bb stopped due to sinus arrest.  Since the last visit he was admitted for increased palpitations and had LHC that was ok. Also started synthroid for elevated TSH.  Event monitor placed. Denies presyncope/syncope. Limited walking due to knee pain. Denies SOB/PND/Orthopnea. Weight at home 294 pounds.    Labs (9/15): K 4, creaitnine 0.9, BNP 448, HCT 41.3, TSH low, free T4 and free T3 elevated   PMH: 1. SVT: s/p RF ablation.  2. GERD 3. Hyperlipidemia 4. Hypertrophic obstructive cardiomyopathy (versus LV changes from severe long-standing HTN): Echo (6/15) with chordal SAM, severe LVH, EF 60%, LVOT peak gradient 53 mmHg.  Echo (8/15) with severe concentric LVH, EF 70%, SAM of anterior mitral leaflet, unable to assess LVOT gradient on this study.  5. Persistent atrial fibrillation: on warfarin.  6. Type II diabetes 7. OSA: On CPAP at night.  8. Gout 9. Carotid stenosis: s/p right CEA, followed by [facility].  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 Hyperthyroidism: s/p RAI ablation, followed by Dr. Laverta Baltimore.  12. CAD: 6/15 NSTEMI, Promus DES to mLAD with 60-70% residual RCA stenosis.  Unstable angina (8/15) witih 80% OM1 on cath, had DES to OM1 (mLAD  stent remained patent).   13. Chronic diastolic CHF 14. History of moderate pericardial effusion. 15. LHC 08/04/2014 Triple vessel CAD with patent stents to LAD and circumflex. No target for PCI>    SH: Lives in [place], quit smoking in 2014, prior cocaine abuse (none now).   FH: CAD in father and sister, no history of sudden cardiac death.   ROS: All systems reviewed and negative except as per HPI.   Current Outpatient Prescriptions  Medication Sig Dispense Refill  . acetaminophen (TYLENOL) 500 MG tablet Take 500 mg by mouth every 6 (six) hours as needed for moderate pain.    [name] clopidogrel (PLAVIX) 75 MG tablet Take 1 tablet (75 mg total) by mouth daily. 30 tablet 5  . colchicine 0.6 MG tablet Take 1 tablet (0.6 mg total) by mouth 2 (two) times daily as needed. 14 tablet 1  . esomeprazole (NEXIUM) 40 MG capsule Take 1 capsule (40 mg total) by mouth daily at 12 noon. 30 capsule 3  . furosemide (LASIX) 20 MG tablet Take 3 tablets (60 mg total) by mouth daily. 90 tablet 3  . HYDROcodone-acetaminophen (NORCO/VICODIN) 5-325 MG per tablet Take 1 tablet by mouth every 6 (six) hours as needed for moderate pain. 60 tablet 0  . isosorbide mononitrate (IMDUR) 60 MG 24 hr tablet Take 1 tablet (60 mg total) by mouth daily. 90 tablet 3  . levothyroxine (SYNTHROID, LEVOTHROID) 25 MCG tablet Take 1 tablet (25 mcg total) by  mouth daily before breakfast. 30 tablet 11  . nitroGLYCERIN (NITROSTAT) 0.4 MG SL tablet Place 1 tablet (0.4 mg total) under the tongue every 5 (five) minutes as needed for chest pain. 25 tablet 12  . potassium chloride SA (K-DUR,KLOR-CON) 20 MEQ tablet TAKE 1 TABLET BY MOUTH DAILY 30 tablet 3  . simvastatin (ZOCOR) 40 MG tablet Take 40 mg by mouth daily.     . traZODone (DESYREL) 50 MG tablet Take 0.5-1 tablets (25-50 mg total) by mouth at bedtime as needed for sleep. 30 tablet 3   No current facility-administered medications for this encounter.   BP 102/68 mmHg  Pulse 95  Wt 295  lb (133.811 kg)  SpO2 98% General: NAD. Has event monitor in place.  Neck: No JVD, no thyromegaly or thyroid nodule.  Lungs: Clear to auscultation bilaterally with normal respiratory effort. CV: Nondisplaced PMI.  Heart irregular S1/S2, no S3/S4, 2/6 early SEM RUSB.  No peripheral edema.  No carotid bruit.  Normal pedal pulses.  Abdomen: Soft, nontender, no hepatosplenomegaly, no distention.  Skin: Intact without lesions or rashes.  Neurologic: Alert and oriented x 3.  Psych: Normal affect. Extremities: No clubbing or cyanosis.  HEENT: Normal.     Assessment/plan: 1. Atrial fibrillation:  Off  bb and verapamil due to sinus node dysfx. . Event monitor in place in place and will be returned  08/31/14. He has follow up with Dr Lovena Le in April.  2. Hypertrophic cardiomyopathy: Concentric severe LVH (not asymmetric) but there appears to be mitral valve SAM with LVOT gradient.   -Needs cardiac MRI to better assess HOCM and also for SCD risk stratification (looking for amount of scar in LV).  - Off  verapamil and Toprol XL due to sinus node dysfx.    3. CAD: No further chest pain.  He will continue on Plavix, and statin.  4. Hyperlipidemia: Check lipids with goal LDL < 70.  5. Carotid stenosis: Followed by [facility].  6. Hyperthyroid- had ablation and now thought to have hypothyroidism. Referred to endocrinologist in February. Now on synthroid. Has follow up 3/18 /16  7. Presyncope- Has event monitior. Likely due to sinus node dysfunction. Off  bb and verapamil. Dr Lovena Le following.  8. H/O subdural hematoma  04/2014. Off coumadin and aspirin.   Follow up in 2 months    CLEGG,AMY NP-C  08/19/2014  Patient seen and examined with Darrick Grinder, NP. We discussed all aspects of the encounter. I agree with the assessment and plan as stated above.   He is doing well but ideally would like to get him back on b-blocker and/or CCB with possible HOCM. Even monitor reviewed asnd shows tachy-brady events with  several 3.5 second pauses. Will have him f/u with EP. As HF not major issue for him will refer back to [facility] for management of HOCM/LVH and other issues.   Daniel Bensimhon,MD 10:46 PM

## 2014-08-20 ENCOUNTER — Other Ambulatory Visit (HOSPITAL_COMMUNITY): Payer: Self-pay | Admitting: Cardiology

## 2014-08-20 DIAGNOSIS — I422 Other hypertrophic cardiomyopathy: Secondary | ICD-10-CM

## 2014-08-20 NOTE — Telephone Encounter (Signed)
Call placed to patient. LMTRC.  

## 2014-08-21 NOTE — Telephone Encounter (Signed)
Patient returned call and made aware.

## 2014-08-27 ENCOUNTER — Encounter: Payer: Self-pay | Admitting: Internal Medicine

## 2014-08-27 ENCOUNTER — Ambulatory Visit (INDEPENDENT_AMBULATORY_CARE_PROVIDER_SITE_OTHER): Payer: Medicare Other | Admitting: Internal Medicine

## 2014-08-27 ENCOUNTER — Encounter: Payer: Self-pay | Admitting: *Deleted

## 2014-08-27 DIAGNOSIS — I495 Sick sinus syndrome: Secondary | ICD-10-CM | POA: Diagnosis not present

## 2014-08-27 DIAGNOSIS — I480 Paroxysmal atrial fibrillation: Secondary | ICD-10-CM

## 2014-08-27 LAB — BASIC METABOLIC PANEL
BUN: 12 mg/dL (ref 6–23)
CO2: 25 mEq/L (ref 19–32)
Calcium: 9.4 mg/dL (ref 8.4–10.5)
Chloride: 104 mEq/L (ref 96–112)
Creatinine, Ser: 1.35 mg/dL (ref 0.40–1.50)
GFR: 69.6 mL/min (ref >60.00)
Glucose, Bld: 85 mg/dL (ref 70–99)
Potassium: 3.6 mEq/L (ref 3.5–5.1)
Sodium: 137 mEq/L (ref 135–145)

## 2014-08-27 LAB — CBC WITH DIFFERENTIAL/PLATELET
Basophils Absolute: 0.1 10*3/uL (ref 0.0–0.1)
Basophils Relative: 0.8 % (ref 0.0–3.0)
Eosinophils Absolute: 0.2 10*3/uL (ref 0.0–0.7)
Eosinophils Relative: 1.9 % (ref 0.0–5.0)
HCT: 45.3 % (ref 39.0–52.0)
Hemoglobin: 15.3 g/dL (ref 13.0–17.0)
Lymphocytes Relative: 40.8 % (ref 12.0–46.0)
Lymphs Abs: 3.4 10*3/uL (ref 0.7–4.0)
MCHC: 33.8 g/dL (ref 30.0–36.0)
MCV: 95.5 fl (ref 78.0–100.0)
Monocytes Absolute: 0.9 10*3/uL (ref 0.1–1.0)
Monocytes Relative: 11.4 % (ref 3.0–12.0)
Neutro Abs: 3.7 10*3/uL (ref 1.4–7.7)
Neutrophils Relative %: 45.1 % (ref 43.0–77.0)
Platelets: 173 10*3/uL (ref 150.0–400.0)
RBC: 4.74 Mil/uL (ref 4.22–5.81)
RDW: 14.9 % (ref 11.5–15.5)
WBC: 8.2 10*3/uL (ref 4.0–10.5)

## 2014-08-27 NOTE — Assessment & Plan Note (Signed)
He is currently asymptomatic. Will follow.

## 2014-08-27 NOTE — Patient Instructions (Signed)
Your physician has recommended that you have a pacemaker inserted. A pacemaker is a small device that is placed under the skin of your chest or abdomen to help control abnormal heart rhythms. This device uses electrical pulses to prompt the heart to beat at a normal rate. Pacemakers are used to treat heart rhythms that are too slow. Wire (leads) are attached to the pacemaker that goes into the chambers of you heart. This is done in the hospital and usually requires and overnight stay. Please see the instruction sheet given to you today for more information.    See instruction sheet for procedure  Your physician recommends that you schedule a follow-up appointment in: 7-10 days from 09/04/14 in device clinic for wound check  Your physician recommends that you return for lab work today BMP/CBC

## 2014-08-27 NOTE — Assessment & Plan Note (Signed)
The patient has had symptomatic rapid rates and pauses out of atrial fib. He is in rhythm today. I have recommended he continue his current meds. I suspect either sotalol or amio will need to be initiated after his PPM is inserted. He will remain off of systemic anti-coagulation.

## 2014-08-27 NOTE — Assessment & Plan Note (Signed)
His blood pressure is well controlled. Today. No change in meds.

## 2014-08-27 NOTE — Assessment & Plan Note (Signed)
He has symptoms correlating with post termination pauses of over 3 seconds with dizzy spells during those episodes. He will undergo PPM insertion next week. The risks/benefits/goals/expectations of the device have been discussed with the patient and he wishes to proceed.

## 2014-08-27 NOTE — Progress Notes (Signed)
HPI [name] returns today for followup. He is a pleasant middle aged man with a h/o tobacco abuse, SVT, s/p catheter ablation, HTN and atrial fibrillation and CAD. He underwent insertion of a non-drug eluting stent to the LAD when he presented with chest pain and was found to have an 80% LAD stenosis. His dyspnea is much improved.He denies chest pain, syncope but also had mild peripheral edema. He has worn a cardiac monitor because of dizzy spells and found to have pauses of over 3 seconds which are symptomatic. He is on no AV nodal blocking drugs although he needs them for his rapid atrial fib. He is not a candidate for systemic anti-coagulation because of subdural hematoma.  He has tolerated plavix.  Allergies  Allergen Reactions  . Lactose Intolerance (Gi) Other (See Comments)    UPSET STOMACH      Current Outpatient Prescriptions  Medication Sig Dispense Refill  . acetaminophen (TYLENOL) 500 MG tablet Take 500 mg by mouth every 6 (six) hours as needed for moderate pain.    [name] clopidogrel (PLAVIX) 75 MG tablet Take 1 tablet (75 mg total) by mouth daily. 30 tablet 5  . colchicine 0.6 MG tablet Take 1 tablet (0.6 mg total) by mouth 2 (two) times daily as needed. (Patient taking differently: Take 0.6 mg by mouth 2 (two) times daily as needed (for gout). ) 14 tablet 1  . esomeprazole (NEXIUM) 40 MG capsule Take 1 capsule (40 mg total) by mouth daily at 12 noon. 30 capsule 3  . furosemide (LASIX) 20 MG tablet Take 3 tablets (60 mg total) by mouth daily. 90 tablet 3  . HYDROcodone-acetaminophen (NORCO/VICODIN) 5-325 MG per tablet Take 1 tablet by mouth every 6 (six) hours as needed for moderate pain. 60 tablet 0  . isosorbide mononitrate (IMDUR) 60 MG 24 hr tablet Take 1 tablet (60 mg total) by mouth daily. 90 tablet 3  . levothyroxine (SYNTHROID, LEVOTHROID) 75 MCG tablet Take 75 mcg by mouth daily.  2  . nitroGLYCERIN (NITROSTAT) 0.4 MG SL tablet Place 1 tablet (0.4 mg total) under the  tongue every 5 (five) minutes as needed for chest pain. 25 tablet 12  . potassium chloride SA (K-DUR,KLOR-CON) 20 MEQ tablet TAKE 1 TABLET BY MOUTH DAILY 30 tablet 3  . simvastatin (ZOCOR) 40 MG tablet Take 40 mg by mouth daily.     . traZODone (DESYREL) 50 MG tablet Take 0.5-1 tablets (25-50 mg total) by mouth at bedtime as needed for sleep. 30 tablet 3   No current facility-administered medications for this visit.     Past Medical History  Diagnosis Date  . Hypertension     Severe LVH with normal EF  . Arteriosclerotic cardiovascular disease (ASCVD) 2005    catheterization in 10/2010:50% mid LAD, diffuse distal disease, circumflex irregularities, large dominant RCA with a 50% ostial, 70% distal, 60% posterolateral and 70% PDA; normal EF  . Cerebrovascular disease 2010    R. carotid endarterectomy; Duplex in 10/2010-widely patent ICAs, subtotal left vertebral-not thought to be contributing to symptoms  . Hyperlipidemia   . Obesity   . Tobacco abuse     Quit 2014  . Benign prostatic hypertrophy   . Low back pain   . History of PSVT (paroxysmal supraventricular tachycardia) 2005    Diagnosed with ILR; no recurrence following RFA  . Cervical spine disease     CT in 2012-advanced degeneration and spondylosis with moderate spinal stenosis at C3-C6  . H/O:  substance abuse     Cocaine, marijuana, alcohol.  Quit 2013.   [name] Syncope   . Gastroesophageal reflux disease   . Depression   . Erectile dysfunction   . CHF (congestive heart failure)   . H/O hiatal hernia   . Headache(784.0)   . Arthritis   . Pericardial effusion without cardiac tamponade 12/03/2013  . Non-ST elevation myocardial infarction (NSTEMI), initial episode of care 12/02/2013    DES LAD  . Thyroid disease   . Sleep apnea     CPAP    ROS:   All systems reviewed and negative except as noted in the HPI.   Past Surgical History  Procedure Laterality Date  . Radiofrequency ablation  2005    for PSVT  . Coronary  angioplasty with stent placement  12/03/2013    LAD 90%-->0% W/ Promus Premier DES 3.0 mm x 16 mm, CFX OK, RCA 40%, EF 70-75%  . Carotid endarterectomy Right Feb. 25, 2010     CEA  . Trudee Kuster hole Right 04/13/2014    Procedure: Haskell Flirt;  Surgeon: Charlie Pitter, MD;  Location: [facility] NEURO ORS;  Service: Neurosurgery;  Laterality: Right;  . Left heart catheterization with coronary angiogram Left 12/03/2013    Procedure: LEFT HEART CATHETERIZATION WITH CORONARY ANGIOGRAM;  Surgeon: Leonie Man, MD;  Location: [facility] CATH LAB;  Service: Cardiovascular;  Laterality: Left;  . Percutaneous coronary stent intervention (pci-s)  12/03/2013    Procedure: PERCUTANEOUS CORONARY STENT INTERVENTION (PCI-S);  Surgeon: Leonie Man, MD;  Location: [facility] CATH LAB;  Service: Cardiovascular;;  . Left heart catheterization with coronary angiogram N/A 01/26/2014    Procedure: LEFT HEART CATHETERIZATION WITH CORONARY ANGIOGRAM;  Surgeon: Jettie Booze, MD;  Location: [facility] CATH LAB;  Service: Cardiovascular;  Laterality: N/A;  . Left heart catheterization with coronary angiogram N/A 08/03/2014    Procedure: LEFT HEART CATHETERIZATION WITH CORONARY ANGIOGRAM;  Surgeon: Burnell Blanks, MD;  Location: [facility] CATH LAB;  Service: Cardiovascular;  Laterality: N/A;     Family History  Problem Relation Age of Onset  . Hypertension Mother     Cerebrovascular disease  . Diabetes Mother   . Coronary artery disease Father 53  . Diabetes type II Father   . Hypertension Father   . Heart attack Father   . Lung cancer Paternal Uncle   . Diabetes Sister   . Hypertension Sister   . Heart attack Sister 75  . Diabetes Brother   . Hypertension Brother      History   Social History  . Marital Status: Married    Spouse Name: N/A  . Number of Children: 0  . Years of Education: N/A   Occupational History  . Retired    Social History Main Topics  . Smoking status: Former Smoker -- 1.00 packs/day for 40 years    Types:  Cigarettes    Start date: 10/20/1972    Quit date: 10/10/2012  . Smokeless tobacco: Never Used     Comment: Quit in May.   . Alcohol Use: No     Comment: former drinker-- sober since 2013.   . Drug Use: No     Comment: quit cocaine 10/2011  . Sexual Activity:    Partners: Female   Other Topics Concern  . Not on file   Social History Narrative   Lives in [place].     BP 98/68 mmHg  Pulse 95  Ht 6' (1.829 m)  Wt 301 lb 6.4 oz (136.714  kg)  BMI 40.87 kg/m2  Physical Exam:  Well appearing middle aged man, looks older than stated age, NAD HEENT: Unremarkable Neck:  7 cm JVD, no thyromegally Back:  No CVA tenderness Lungs:  Clear except for basilar rales HEART:  Regular rate rhythm, no murmurs, no rubs, no clicks Abd:  soft, positive bowel sounds, no organomegally, no rebound, no guarding Ext:  2 plus pulses, no edema, no cyanosis, no clubbing Skin:  No rashes no nodules Neuro:  CN II through XII intact, motor grossly intact    Assess/Plan:

## 2014-08-28 ENCOUNTER — Other Ambulatory Visit: Payer: Self-pay | Admitting: Internal Medicine

## 2014-08-30 ENCOUNTER — Ambulatory Visit
Admission: RE | Admit: 2014-08-30 | Discharge: 2014-08-30 | Disposition: A | Discharge status: Home / Self Care | Payer: Medicare Other | Source: Ambulatory Visit | Attending: Orthopedic Surgery | Admitting: Orthopedic Surgery

## 2014-08-30 DIAGNOSIS — M2391 Unspecified internal derangement of right knee: Secondary | ICD-10-CM

## 2014-08-31 ENCOUNTER — Other Ambulatory Visit: Payer: Self-pay | Admitting: Family Medicine

## 2014-08-31 ENCOUNTER — Encounter: Payer: Self-pay | Admitting: Internal Medicine

## 2014-08-31 NOTE — Telephone Encounter (Signed)
Medication refilled per protocol. 

## 2014-09-03 MED ORDER — SODIUM CHLORIDE 0.9 % IR SOLN
80.0000 mg | Status: AC
  Filled 2014-09-03: qty 2

## 2014-09-03 MED ORDER — SODIUM CHLORIDE 0.9 % IV SOLN
INTRAVENOUS | Status: DC
Start: 2014-09-03 — End: 2017-02-21

## 2014-09-03 MED ORDER — CHLORHEXIDINE GLUCONATE 4 % EX LIQD
60.0000 mL | Freq: Once | CUTANEOUS | Status: DC
  Filled 2014-09-03: qty 60

## 2014-09-03 MED ORDER — DEXTROSE 5 % IV SOLN
3.0000 g | INTRAVENOUS | Status: AC
  Filled 2014-09-03: qty 3000

## 2014-09-04 ENCOUNTER — Ambulatory Visit (HOSPITAL_COMMUNITY): Admission: RE | Admit: 2014-09-04 | Payer: Medicare Other | Source: Ambulatory Visit | Admitting: Internal Medicine

## 2014-09-04 SURGERY — PERMANENT PACEMAKER INSERTION
Anesthesia: LOCAL

## 2014-09-07 ENCOUNTER — Other Ambulatory Visit: Payer: Self-pay | Admitting: *Deleted

## 2014-09-07 DIAGNOSIS — I5032 Chronic diastolic (congestive) heart failure: Secondary | ICD-10-CM

## 2014-09-07 MED ORDER — FUROSEMIDE 20 MG PO TABS: 60.0000 mg | ORAL_TABLET | Freq: Every day | ORAL | Status: DC

## 2014-09-10 ENCOUNTER — Ambulatory Visit: Payer: Medicare Other

## 2014-09-14 ENCOUNTER — Telehealth: Payer: Self-pay | Admitting: Internal Medicine

## 2014-09-14 ENCOUNTER — Ambulatory Visit (HOSPITAL_COMMUNITY)
Admission: RE | Admit: 2014-09-14 | Discharge: 2014-09-14 | Disposition: A | Discharge status: Home / Self Care | Payer: Medicare Other | Source: Ambulatory Visit | Attending: Internal Medicine | Admitting: Internal Medicine

## 2014-09-14 DIAGNOSIS — I422 Other hypertrophic cardiomyopathy: Secondary | ICD-10-CM

## 2014-09-14 NOTE — Telephone Encounter (Signed)
New Message  Pt wife wanting to speak w/ Rn about what to do going forward. Per pt wife- pt was to get MRI today 4/11 and appt was cancelled because pt could not fit in the scanner. Pt wanted to speak w/ Rn about what to do about MRI and surgery sched for 4/15. Can leave detailed message on VM. Please call back and discuss.

## 2014-09-14 NOTE — Telephone Encounter (Signed)
Spoke to patient's wife and let her know Nira Conn would discuss with Dr Haroldine Laws tomorrow and I would let her know what he says  We will keep ICD as scheduled for Fri

## 2014-09-15 NOTE — Telephone Encounter (Signed)
Dr Haroldine Laws aware pt unable to have MRI, ok for him to proceed with ICD Friday

## 2014-09-18 ENCOUNTER — Encounter (HOSPITAL_COMMUNITY): Payer: Self-pay | Admitting: General Practice

## 2014-09-18 ENCOUNTER — Ambulatory Visit (HOSPITAL_COMMUNITY)
Admission: RE | Admit: 2014-09-18 | Discharge: 2014-09-19 | Disposition: A | Discharge status: Home / Self Care | Payer: Medicare Other | Source: Ambulatory Visit | Attending: Internal Medicine | Admitting: Internal Medicine

## 2014-09-18 ENCOUNTER — Encounter (HOSPITAL_COMMUNITY)
Admission: RE | Disposition: A | Discharge status: Home / Self Care | Payer: Self-pay | Source: Ambulatory Visit | Attending: Internal Medicine

## 2014-09-18 DIAGNOSIS — I252 Old myocardial infarction: Secondary | ICD-10-CM | POA: Diagnosis not present

## 2014-09-18 DIAGNOSIS — I509 Heart failure, unspecified: Secondary | ICD-10-CM | POA: Diagnosis not present

## 2014-09-18 DIAGNOSIS — Z950 Presence of cardiac pacemaker: Secondary | ICD-10-CM | POA: Diagnosis present

## 2014-09-18 DIAGNOSIS — I2510 Atherosclerotic heart disease of native coronary artery without angina pectoris: Secondary | ICD-10-CM | POA: Insufficient documentation

## 2014-09-18 DIAGNOSIS — Z955 Presence of coronary angioplasty implant and graft: Secondary | ICD-10-CM | POA: Insufficient documentation

## 2014-09-18 DIAGNOSIS — M4802 Spinal stenosis, cervical region: Secondary | ICD-10-CM | POA: Diagnosis not present

## 2014-09-18 DIAGNOSIS — I421 Obstructive hypertrophic cardiomyopathy: Secondary | ICD-10-CM | POA: Diagnosis not present

## 2014-09-18 DIAGNOSIS — N400 Enlarged prostate without lower urinary tract symptoms: Secondary | ICD-10-CM | POA: Insufficient documentation

## 2014-09-18 DIAGNOSIS — Z79899 Other long term (current) drug therapy: Secondary | ICD-10-CM | POA: Insufficient documentation

## 2014-09-18 DIAGNOSIS — G4730 Sleep apnea, unspecified: Secondary | ICD-10-CM | POA: Insufficient documentation

## 2014-09-18 DIAGNOSIS — M47892 Other spondylosis, cervical region: Secondary | ICD-10-CM | POA: Diagnosis not present

## 2014-09-18 DIAGNOSIS — I495 Sick sinus syndrome: Secondary | ICD-10-CM | POA: Diagnosis not present

## 2014-09-18 DIAGNOSIS — E785 Hyperlipidemia, unspecified: Secondary | ICD-10-CM | POA: Diagnosis not present

## 2014-09-18 DIAGNOSIS — Z87891 Personal history of nicotine dependence: Secondary | ICD-10-CM | POA: Insufficient documentation

## 2014-09-18 DIAGNOSIS — I10 Essential (primary) hypertension: Secondary | ICD-10-CM | POA: Insufficient documentation

## 2014-09-18 DIAGNOSIS — E669 Obesity, unspecified: Secondary | ICD-10-CM | POA: Insufficient documentation

## 2014-09-18 DIAGNOSIS — I4892 Unspecified atrial flutter: Secondary | ICD-10-CM | POA: Diagnosis not present

## 2014-09-18 DIAGNOSIS — K219 Gastro-esophageal reflux disease without esophagitis: Secondary | ICD-10-CM | POA: Insufficient documentation

## 2014-09-18 DIAGNOSIS — E079 Disorder of thyroid, unspecified: Secondary | ICD-10-CM | POA: Diagnosis not present

## 2014-09-18 DIAGNOSIS — Z7902 Long term (current) use of antithrombotics/antiplatelets: Secondary | ICD-10-CM | POA: Insufficient documentation

## 2014-09-18 DIAGNOSIS — I4891 Unspecified atrial fibrillation: Secondary | ICD-10-CM | POA: Insufficient documentation

## 2014-09-18 DIAGNOSIS — K449 Diaphragmatic hernia without obstruction or gangrene: Secondary | ICD-10-CM | POA: Diagnosis not present

## 2014-09-18 DIAGNOSIS — M1990 Unspecified osteoarthritis, unspecified site: Secondary | ICD-10-CM | POA: Diagnosis not present

## 2014-09-18 DIAGNOSIS — Z6841 Body Mass Index (BMI) 40.0 and over, adult: Secondary | ICD-10-CM | POA: Diagnosis not present

## 2014-09-18 DIAGNOSIS — F329 Major depressive disorder, single episode, unspecified: Secondary | ICD-10-CM | POA: Insufficient documentation

## 2014-09-18 DIAGNOSIS — I679 Cerebrovascular disease, unspecified: Secondary | ICD-10-CM | POA: Diagnosis not present

## 2014-09-18 DIAGNOSIS — M10071 Idiopathic gout, right ankle and foot: Secondary | ICD-10-CM

## 2014-09-18 HISTORY — DX: Sick sinus syndrome: I49.5

## 2014-09-18 HISTORY — DX: Presence of cardiac pacemaker: Z95.0

## 2014-09-18 HISTORY — PX: PERMANENT PACEMAKER INSERTION: SHX5480

## 2014-09-18 LAB — BASIC METABOLIC PANEL
Anion gap: 8 (ref 5–15)
BUN: 10 mg/dL (ref 6–23)
CO2: 25 mmol/L (ref 19–32)
Calcium: 8.9 mg/dL (ref 8.4–10.5)
Chloride: 107 mmol/L (ref 96–112)
Creatinine, Ser: 1.22 mg/dL (ref 0.50–1.35)
GFR calc Af Amer: 74 mL/min — ABNORMAL LOW (ref >90)
GFR calc non Af Amer: 64 mL/min — ABNORMAL LOW (ref >90)
Glucose, Bld: 90 mg/dL (ref 70–99)
Potassium: 3.8 mmol/L (ref 3.5–5.1)
Sodium: 140 mmol/L (ref 135–145)

## 2014-09-18 LAB — CBC
HCT: 46.6 % (ref 39.0–52.0)
Hemoglobin: 15.9 g/dL (ref 13.0–17.0)
MCH: 32.1 pg (ref 26.0–34.0)
MCHC: 34.1 g/dL (ref 30.0–36.0)
MCV: 94.1 fL (ref 78.0–100.0)
Platelets: 156 10*3/uL (ref 150–400)
RBC: 4.95 MIL/uL (ref 4.22–5.81)
RDW: 13.6 % (ref 11.5–15.5)
WBC: 7.5 10*3/uL (ref 4.0–10.5)

## 2014-09-18 LAB — SURGICAL PCR SCREEN
MRSA, PCR: NEGATIVE
Staphylococcus aureus: NEGATIVE

## 2014-09-18 SURGERY — PERMANENT PACEMAKER INSERTION
Anesthesia: LOCAL

## 2014-09-18 MED ORDER — SIMVASTATIN 40 MG PO TABS
40.0000 mg | ORAL_TABLET | Freq: Every day | ORAL | Status: DC
  Administered 2014-09-18: 40 mg via ORAL
  Filled 2014-09-18 (×3): qty 1

## 2014-09-18 MED ORDER — SODIUM CHLORIDE 0.9 % IR SOLN
80.0000 mg | Status: DC
  Filled 2014-09-18: qty 2

## 2014-09-18 MED ORDER — FUROSEMIDE 20 MG PO TABS
60.0000 mg | ORAL_TABLET | Freq: Every day | ORAL | Status: DC
Start: 2014-09-18 — End: 2014-09-19
  Administered 2014-09-18 – 2014-09-19 (×2): 60 mg via ORAL
  Filled 2014-09-18 (×2): qty 3

## 2014-09-18 MED ORDER — CHLORHEXIDINE GLUCONATE 4 % EX LIQD
60.0000 mL | Freq: Once | CUTANEOUS | Status: DC
  Filled 2014-09-18: qty 60

## 2014-09-18 MED ORDER — ACETAMINOPHEN 325 MG PO TABS: 325.0000 mg | ORAL_TABLET | ORAL | Status: DC | PRN

## 2014-09-18 MED ORDER — POTASSIUM CHLORIDE CRYS ER 20 MEQ PO TBCR
20.0000 meq | EXTENDED_RELEASE_TABLET | Freq: Every day | ORAL | Status: DC
  Administered 2014-09-18 – 2014-09-19 (×2): 20 meq via ORAL
  Filled 2014-09-18 (×2): qty 1

## 2014-09-18 MED ORDER — ONDANSETRON HCL 4 MG/2ML IJ SOLN: 4.0000 mg | Freq: Four times a day (QID) | INTRAMUSCULAR | Status: DC | PRN

## 2014-09-18 MED ORDER — HYDROCODONE-ACETAMINOPHEN 10-325 MG PO TABS
1.0000 | ORAL_TABLET | Freq: Three times a day (TID) | ORAL | Status: DC
  Administered 2014-09-18 – 2014-09-19 (×3): 1 via ORAL
  Filled 2014-09-18 (×3): qty 1

## 2014-09-18 MED ORDER — DEXTROSE 5 % IV SOLN
3.0000 g | INTRAVENOUS | Status: DC
  Filled 2014-09-18: qty 3000

## 2014-09-18 MED ORDER — SODIUM CHLORIDE 0.9 % IV SOLN
INTRAVENOUS | Status: DC
  Administered 2014-09-18: 14:00:00 via INTRAVENOUS

## 2014-09-18 MED ORDER — LEVOTHYROXINE SODIUM 75 MCG PO TABS
75.0000 ug | ORAL_TABLET | Freq: Every day | ORAL | Status: DC
  Administered 2014-09-19: 75 ug via ORAL
  Filled 2014-09-18 (×2): qty 1

## 2014-09-18 MED ORDER — NITROGLYCERIN 0.4 MG SL SUBL: 0.4000 mg | SUBLINGUAL_TABLET | SUBLINGUAL | Status: DC | PRN

## 2014-09-18 MED ORDER — LIDOCAINE HCL (PF) 1 % IJ SOLN
INTRAMUSCULAR | Status: AC
  Filled 2014-09-18: qty 60

## 2014-09-18 MED ORDER — ISOSORBIDE MONONITRATE ER 60 MG PO TB24
60.0000 mg | ORAL_TABLET | Freq: Every day | ORAL | Status: DC
  Administered 2014-09-18 – 2014-09-19 (×2): 60 mg via ORAL
  Filled 2014-09-18 (×2): qty 1

## 2014-09-18 MED ORDER — TRAZODONE 25 MG HALF TABLET
25.0000 mg | ORAL_TABLET | Freq: Every evening | ORAL | Status: DC | PRN
  Filled 2014-09-18: qty 1

## 2014-09-18 MED ORDER — FENTANYL CITRATE (PF) 100 MCG/2ML IJ SOLN
INTRAMUSCULAR | Status: AC
  Filled 2014-09-18: qty 2

## 2014-09-18 MED ORDER — MUPIROCIN 2 % EX OINT
TOPICAL_OINTMENT | Freq: Two times a day (BID) | CUTANEOUS | Status: DC
  Administered 2014-09-18: 1 via NASAL
  Filled 2014-09-18: qty 22

## 2014-09-18 MED ORDER — PANTOPRAZOLE SODIUM 40 MG PO TBEC
40.0000 mg | DELAYED_RELEASE_TABLET | Freq: Every day | ORAL | Status: DC
  Administered 2014-09-18 – 2014-09-19 (×2): 40 mg via ORAL
  Filled 2014-09-18 (×2): qty 1

## 2014-09-18 MED ORDER — MUPIROCIN 2 % EX OINT
TOPICAL_OINTMENT | CUTANEOUS | Status: AC
  Administered 2014-09-18: 1 via NASAL
  Filled 2014-09-18: qty 22

## 2014-09-18 MED ORDER — CEFAZOLIN SODIUM-DEXTROSE 2-3 GM-% IV SOLR
2.0000 g | Freq: Four times a day (QID) | INTRAVENOUS | Status: AC
  Administered 2014-09-18 – 2014-09-19 (×3): 2 g via INTRAVENOUS
  Filled 2014-09-18 (×4): qty 50

## 2014-09-18 MED ORDER — ACETAMINOPHEN 500 MG PO TABS: 500.0000 mg | ORAL_TABLET | Freq: Four times a day (QID) | ORAL | Status: DC | PRN

## 2014-09-18 MED ORDER — YOU HAVE A PACEMAKER BOOK
Freq: Once | Status: AC
  Administered 2014-09-18: 21:00:00
  Filled 2014-09-18: qty 1

## 2014-09-18 MED ORDER — MIDAZOLAM HCL 5 MG/5ML IJ SOLN
INTRAMUSCULAR | Status: AC
  Filled 2014-09-18: qty 5

## 2014-09-18 NOTE — Discharge Instructions (Signed)
° ° °  Supplemental Discharge Instructions for  Pacemaker Patients  Activity No heavy lifting or vigorous activity with your left arm for 6 to 8 weeks.  Do not raise your left arm above your head for one week.  Gradually raise your affected arm as drawn below.           __4/16/16                          09/21/14                  09/23/14                   09/25/14  NO DRIVING for 1 week    ; you may begin driving on 6/56/81.  WOUND CARE - Keep the wound area clean and dry.  Do not get this area wet for one week. No showers for one week; you may shower on 09/25/14    . - The tape/steri-strips on your wound will fall off; do not pull them off.  No bandage is needed on the site.  DO  NOT apply any creams, oils, or ointments to the wound area. - If you notice any drainage or discharge from the wound, any swelling or bruising at the site, or you develop a fever > 101? F after you are discharged home, call the office at once.  Special Instructions - You are still able to use cellular telephones; use the ear opposite the side where you have your pacemaker.  Avoid carrying your cellular phone near your device. - When traveling through airports, show security personnel your identification card to avoid being screened in the metal detectors.  Ask the security personnel to use the hand wand. - Avoid arc welding equipment, MRI testing (magnetic resonance imaging), TENS units (transcutaneous nerve stimulators).  Call the office for questions about other devices. - Avoid electrical appliances that are in poor condition or are not properly grounded. - Microwave ovens are safe to be near or to operate.  Heart Healthy diet

## 2014-09-18 NOTE — H&P (View-Only) (Signed)
HPI Mr. Dalzell returns today for followup. He is a pleasant middle aged man with a h/o tobacco abuse, SVT, s/p catheter ablation, HTN and atrial fibrillation and CAD. He underwent insertion of a non-drug eluting stent to the LAD when he presented with chest pain and was found to have an 80% LAD stenosis. His dyspnea is much improved.He denies chest pain, syncope but also had mild peripheral edema. He has worn a cardiac monitor because of dizzy spells and found to have pauses of over 3 seconds which are symptomatic. He is on no AV nodal blocking drugs although he needs them for his rapid atrial fib. He is not a candidate for systemic anti-coagulation because of subdural hematoma.  He has tolerated plavix.  Allergies  Allergen Reactions  . Lactose Intolerance (Gi) Other (See Comments)    UPSET STOMACH      Current Outpatient Prescriptions  Medication Sig Dispense Refill  . acetaminophen (TYLENOL) 500 MG tablet Take 500 mg by mouth every 6 (six) hours as needed for moderate pain.    [name] clopidogrel (PLAVIX) 75 MG tablet Take 1 tablet (75 mg total) by mouth daily. 30 tablet 5  . colchicine 0.6 MG tablet Take 1 tablet (0.6 mg total) by mouth 2 (two) times daily as needed. (Patient taking differently: Take 0.6 mg by mouth 2 (two) times daily as needed (for gout). ) 14 tablet 1  . esomeprazole (NEXIUM) 40 MG capsule Take 1 capsule (40 mg total) by mouth daily at 12 noon. 30 capsule 3  . furosemide (LASIX) 20 MG tablet Take 3 tablets (60 mg total) by mouth daily. 90 tablet 3  . HYDROcodone-acetaminophen (NORCO/VICODIN) 5-325 MG per tablet Take 1 tablet by mouth every 6 (six) hours as needed for moderate pain. 60 tablet 0  . isosorbide mononitrate (IMDUR) 60 MG 24 hr tablet Take 1 tablet (60 mg total) by mouth daily. 90 tablet 3  . levothyroxine (SYNTHROID, LEVOTHROID) 75 MCG tablet Take 75 mcg by mouth daily.  2  . nitroGLYCERIN (NITROSTAT) 0.4 MG SL tablet Place 1 tablet (0.4 mg total) under the  tongue every 5 (five) minutes as needed for chest pain. 25 tablet 12  . potassium chloride SA (K-DUR,KLOR-CON) 20 MEQ tablet TAKE 1 TABLET BY MOUTH DAILY 30 tablet 3  . simvastatin (ZOCOR) 40 MG tablet Take 40 mg by mouth daily.     . traZODone (DESYREL) 50 MG tablet Take 0.5-1 tablets (25-50 mg total) by mouth at bedtime as needed for sleep. 30 tablet 3   No current facility-administered medications for this visit.     Past Medical History  Diagnosis Date  . Hypertension     Severe LVH with normal EF  . Arteriosclerotic cardiovascular disease (ASCVD) 2005    catheterization in 10/2010:50% mid LAD, diffuse distal disease, circumflex irregularities, large dominant RCA with a 50% ostial, 70% distal, 60% posterolateral and 70% PDA; normal EF  . Cerebrovascular disease 2010    R. carotid endarterectomy; Duplex in 10/2010-widely patent ICAs, subtotal left vertebral-not thought to be contributing to symptoms  . Hyperlipidemia   . Obesity   . Tobacco abuse     Quit 2014  . Benign prostatic hypertrophy   . Low back pain   . History of PSVT (paroxysmal supraventricular tachycardia) 2005    Diagnosed with ILR; no recurrence following RFA  . Cervical spine disease     CT in 2012-advanced degeneration and spondylosis with moderate spinal stenosis at C3-C6  . H/O:  substance abuse     Cocaine, marijuana, alcohol.  Quit 2013.   [name] Syncope   . Gastroesophageal reflux disease   . Depression   . Erectile dysfunction   . CHF (congestive heart failure)   . H/O hiatal hernia   . Headache(784.0)   . Arthritis   . Pericardial effusion without cardiac tamponade 12/03/2013  . Non-ST elevation myocardial infarction (NSTEMI), initial episode of care 12/02/2013    DES LAD  . Thyroid disease   . Sleep apnea     CPAP    ROS:   All systems reviewed and negative except as noted in the HPI.   Past Surgical History  Procedure Laterality Date  . Radiofrequency ablation  2005    for PSVT  . Coronary  angioplasty with stent placement  12/03/2013    LAD 90%-->0% W/ Promus Premier DES 3.0 mm x 16 mm, CFX OK, RCA 40%, EF 70-75%  . Carotid endarterectomy Right Feb. 25, 2010     CEA  . Trudee Kuster hole Right 04/13/2014    Procedure: Haskell Flirt;  Surgeon: Charlie Pitter, MD;  Location: [place] [facility];  Service: Neurosurgery;  Laterality: Right;  . Left heart catheterization with coronary angiogram Left 12/03/2013    Procedure: LEFT HEART CATHETERIZATION WITH CORONARY ANGIOGRAM;  Surgeon: Leonie Man, MD;  Location: [facility] CATH LAB;  Service: Cardiovascular;  Laterality: Left;  . Percutaneous coronary stent intervention (pci-s)  12/03/2013    Procedure: PERCUTANEOUS CORONARY STENT INTERVENTION (PCI-S);  Surgeon: Leonie Man, MD;  Location: [facility] CATH LAB;  Service: Cardiovascular;;  . Left heart catheterization with coronary angiogram N/A 01/26/2014    Procedure: LEFT HEART CATHETERIZATION WITH CORONARY ANGIOGRAM;  Surgeon: Jettie Booze, MD;  Location: [facility] CATH LAB;  Service: Cardiovascular;  Laterality: N/A;  . Left heart catheterization with coronary angiogram N/A 08/03/2014    Procedure: LEFT HEART CATHETERIZATION WITH CORONARY ANGIOGRAM;  Surgeon: Burnell Blanks, MD;  Location: [facility] CATH LAB;  Service: Cardiovascular;  Laterality: N/A;     Family History  Problem Relation Age of Onset  . Hypertension Mother     Cerebrovascular disease  . Diabetes Mother   . Coronary artery disease Father 62  . Diabetes type II Father   . Hypertension Father   . Heart attack Father   . Lung cancer Paternal Uncle   . Diabetes Sister   . Hypertension Sister   . Heart attack Sister 68  . Diabetes Brother   . Hypertension Brother      History   Social History  . Marital Status: Married    Spouse Name: N/A  . Number of Children: 0  . Years of Education: N/A   Occupational History  . Retired    Social History Main Topics  . Smoking status: Former Smoker -- 1.00 packs/day for 40 years    Types:  Cigarettes    Start date: 10/20/1972    Quit date: 10/10/2012  . Smokeless tobacco: Never Used     Comment: Quit in May.   . Alcohol Use: No     Comment: former drinker-- sober since 2013.   . Drug Use: No     Comment: quit cocaine 10/2011  . Sexual Activity:    Partners: Female   Other Topics Concern  . Not on file   Social History Narrative   Lives in [place].     BP 98/68 mmHg  Pulse 95  Ht 6' (1.829 m)  Wt 301 lb 6.4 oz (136.714  kg)  BMI 40.87 kg/m2  Physical Exam:  Well appearing middle aged man, looks older than stated age, NAD HEENT: Unremarkable Neck:  7 cm JVD, no thyromegally Back:  No CVA tenderness Lungs:  Clear except for basilar rales HEART:  Regular rate rhythm, no murmurs, no rubs, no clicks Abd:  soft, positive bowel sounds, no organomegally, no rebound, no guarding Ext:  2 plus pulses, no edema, no cyanosis, no clubbing Skin:  No rashes no nodules Neuro:  CN II through XII intact, motor grossly intact    Assess/Plan:

## 2014-09-18 NOTE — Interval H&P Note (Signed)
History and Physical Interval Note:  09/18/2014 2:53 PM  Peter Dunlap  has presented today for surgery, with the diagnosis of sick sinus syndrome  The various methods of treatment have been discussed with the patient and family. After consideration of risks, benefits and other options for treatment, the patient has consented to  Procedure(s): PERMANENT PACEMAKER INSERTION (N/A) as a surgical intervention .  The patient's history has been reviewed, patient examined, no change in status, stable for surgery.  I have reviewed the patient's chart and labs.  Questions were answered to the patient's satisfaction.     Mikle Bosworth.D.

## 2014-09-18 NOTE — Discharge Summary (Signed)
Physician Discharge Summary       Patient ID: NASSIM COSMA MRN: [id number] DOB/AGE: [birth year] 59 y.o.  Admit date: 09/18/2014 Discharge date: 09/19/2014 Primary Cardiologist: Dr. Lovena Le  Discharge Diagnoses:  Principal Problem:   Tachy-brady syndrome Active Problems:   HOCM (hypertrophic obstructive cardiomyopathy)   Atrial flutter with rapid ventricular response   S/P cardiac pacemaker procedure   Pacemaker   Discharged Condition: good  Procedures: pacemaker implantation  Hospital Course: 59 year old male with a h/o tobacco abuse, SVT, s/p catheter ablation, HTN and atrial fibrillation and CAD. He underwent insertion of a non-drug eluting stent to the LAD when he presented with chest pain and was found to have an 80% LAD stenosis. His dyspnea is much improved.He denies chest pain, syncope but also had mild peripheral edema. He has worn a cardiac monitor because of dizzy spells and found to have pauses of over 3 seconds which are symptomatic. He is on no AV nodal blocking drugs although he needs them for his rapid atrial fib. He is not a candidate for systemic anti-coagulation because of subdural hematoma. He has tolerated plavix.  He has symptoms correlating with post termination pauses of over 3 seconds with dizzy spells during those episodes. He saw Dr. Lovena Le and after discussion of risks/benefits/goals/expectations of the device were discussed with the patient and he wished to proceed.   He also has had symptomatic rapid rates and pauses out of atrial fib. He was in rhythm on office visit. Dr. Lovena Le suspects either sotalol or amio will need to be initiated after his PPM is inserted. He will remain off of systemic anti-coagulation.   He presented to hospital for elective PPM.  He tolerated procedure without complications.  By the next AM he was stable and ready for discharge, found stable by Dr. Rayann Heman on rounds.  Monitor with sinus rhythm with frequent PACs.  CXR reveals  stable leads, no ptx.  Device interrogation is reviewed and normal.   Consults: None  Significant Diagnostic Studies:  BMET    Component Value Date/Time   NA 140 09/18/2014 1400   K 3.8 09/18/2014 1400   CL 107 09/18/2014 1400   CO2 25 09/18/2014 1400   GLUCOSE 90 09/18/2014 1400   BUN 10 09/18/2014 1400   CREATININE 1.22 09/18/2014 1400   CREATININE 1.24 07/27/2014 0757   CALCIUM 8.9 09/18/2014 1400   GFRNONAA 64* 09/18/2014 1400   GFRAA 74* 09/18/2014 1400     Discharge Exam: Blood pressure 144/84, pulse 92, temperature 97.7 F (36.5 C), temperature source Oral, resp. rate 16, height 6' (1.829 m), weight 303 lb 9.2 oz (137.7 kg), SpO2 97 %.  General:Pleasant affect, NAD Skin:Warm and dry, brisk capillary refill HEENT:normocephalic, sclera clear, mucus membranes moist Neck:supple, no JVD, no bruits  Heart:S1S2 RRR without murmur, gallup, rub or click Lungs:clear without rales, rhonchi, or wheezes NVB:TYOM, non tender, + BS, do not palpate liver spleen or masses Ext:no lower ext edema, 2+ pedal pulses, 2+ radial pulses Neuro:alert and oriented, MAE, follows commands, + facial symmetry Skin: device pocket is without hematoma   Disposition: 01-Home or Self Care     Medication List    TAKE these medications        acetaminophen 500 MG tablet  Commonly known as:  TYLENOL  Take 500 mg by mouth every 6 (six) hours as needed for moderate pain.     clopidogrel 75 MG tablet  Commonly known as:  PLAVIX  Take 1 tablet (75 mg total)  by mouth daily.     colchicine 0.6 MG tablet  Take 1 tablet (0.6 mg total) by mouth 2 (two) times daily as needed (for gout).     furosemide 20 MG tablet  Commonly known as:  LASIX  Take 3 tablets (60 mg total) by mouth daily.     HYDROcodone-acetaminophen 10-325 MG per tablet  Commonly known as:  NORCO  Take 1 tablet by mouth 3 (three) times daily.     isosorbide mononitrate 60 MG 24 hr tablet  Commonly known as:  IMDUR  Take 1 tablet  (60 mg total) by mouth daily.     levothyroxine 75 MCG tablet  Commonly known as:  SYNTHROID, LEVOTHROID  Take 75 mcg by mouth daily.     NEXIUM 40 MG capsule  Generic drug:  esomeprazole  TAKE 1 CAPSULE BY MOUTH EVERY DAY AT 12 NOON     nitroGLYCERIN 0.4 MG SL tablet  Commonly known as:  NITROSTAT  Place 1 tablet (0.4 mg total) under the tongue every 5 (five) minutes as needed for chest pain.     potassium chloride SA 20 MEQ tablet  Commonly known as:  K-DUR,KLOR-CON  TAKE 1 TABLET BY MOUTH DAILY     RAPAFLO 8 MG Caps capsule  Generic drug:  silodosin  Take 8 mg by mouth daily.     simvastatin 40 MG tablet  Commonly known as:  ZOCOR  Take 40 mg by mouth daily.     traZODone 50 MG tablet  Commonly known as:  DESYREL  Take 0.5-1 tablets (25-50 mg total) by mouth at bedtime as needed for sleep.       Follow-up Information    Follow up with [facility] On 09/30/2014.   Specialty:  Cardiology   Why:  at 2:30 PM   Contact information:   [address] [place]      Follow up with [name], MD On 10/23/2014.   Specialty:  Cardiology   Why:  at 9:00AM   Contact information:   [place] [address] [place] [address] [phone]        Discharge Instructions: pacemaker instructions.  Heart Healthy diet.  Signed: [name] Nurse Practitioner-Certified [facility]: [facility] 09/19/2014, 7:59 AM  Time spent on discharge : >30 minutes.     [name] MD

## 2014-09-18 NOTE — CV Procedure (Signed)
SURGEON:  [name], MD     PREPROCEDURE DIAGNOSIS:  Symptomatic Bradycardia due to sinus node dysfunction    POSTPROCEDURE DIAGNOSIS:  Same as preprocedure diagnosis     PROCEDURES:   1. Pacemaker implantation.     INTRODUCTION: Peter Dunlap is a 59 y.o. male  with a history of bradycardia who presents today for pacemaker implantation.  The patient reports intermittent episodes of dizziness over the past few months.  No reversible causes have been identified.  The patient therefore presents today for pacemaker implantation.     DESCRIPTION OF PROCEDURE:  Informed written consent was obtained, and   the patient was brought to the electrophysiology lab in a fasting state.  The patient required no sedation for the procedure today.  The patients left chest was prepped and draped in the usual sterile fashion by the EP lab staff. The skin overlying the left deltopectoral region was infiltrated with lidocaine for local analgesia.  A 4-cm incision was made over the left deltopectoral region.  A left subcutaneous pacemaker pocket was fashioned using a combination of sharp and blunt dissection. Electrocautery was required to assure hemostasis.    RA/RV Lead Placement: The left axillary vein was therefore directly visualized and cannulated.  Through the left axillary vein, a St. Jude (serial number [id number]) right atrial lead and a [employer]. Jude (serial number [id number]) right ventricular lead were advanced with fluoroscopic visualization into the right atrial appendage and right ventricular apical septal positions respectively.  Initial atrial lead P- waves measured 6 mV with impedance of 463 ohms and a threshold of 0.6 V at 0.5 msec.  Right ventricular lead R-waves measured 35 mV with an impedance of 774 ohms and a threshold of 0.6 V at 0.5 msec.  Both leads were secured to the pectoralis fascia using #2-0 silk over the suture sleeves.   Device Placement:  The leads were then connected to a St. Jude  (serial number [id number]) pacemaker.  The pocket was irrigated with copious gentamicin solution.  The pacemaker was then placed into the pocket.  The pocket was then closed in 2 layers with 2.0 Vicryl suture for the subcutaneous and subcuticular layers.  Steri-Strips and a sterile dressing were then applied.  There were no early apparent complications.     CONCLUSIONS:   1. Successful implantation of a St. Jude dual-chamber pacemaker for symptomatic bradycardia due to sinus node dysfunction  2. No early apparent complications.           [name], MD 09/18/2014 4:29 PM

## 2014-09-19 ENCOUNTER — Ambulatory Visit (HOSPITAL_COMMUNITY): Payer: Medicare Other

## 2014-09-19 DIAGNOSIS — I4891 Unspecified atrial fibrillation: Secondary | ICD-10-CM | POA: Diagnosis not present

## 2014-09-19 DIAGNOSIS — I495 Sick sinus syndrome: Secondary | ICD-10-CM | POA: Diagnosis not present

## 2014-09-19 DIAGNOSIS — I2510 Atherosclerotic heart disease of native coronary artery without angina pectoris: Secondary | ICD-10-CM | POA: Diagnosis not present

## 2014-09-19 DIAGNOSIS — I10 Essential (primary) hypertension: Secondary | ICD-10-CM | POA: Diagnosis not present

## 2014-09-19 MED ORDER — COLCHICINE 0.6 MG PO TABS: 0.6000 mg | ORAL_TABLET | Freq: Two times a day (BID) | ORAL | Status: DC | PRN

## 2014-09-30 ENCOUNTER — Ambulatory Visit (INDEPENDENT_AMBULATORY_CARE_PROVIDER_SITE_OTHER): Payer: Medicare Other | Admitting: *Deleted

## 2014-09-30 DIAGNOSIS — I480 Paroxysmal atrial fibrillation: Secondary | ICD-10-CM | POA: Diagnosis not present

## 2014-09-30 DIAGNOSIS — I495 Sick sinus syndrome: Secondary | ICD-10-CM

## 2014-09-30 DIAGNOSIS — Z950 Presence of cardiac pacemaker: Secondary | ICD-10-CM

## 2014-09-30 LAB — MDC_IDC_ENUM_SESS_TYPE_INCLINIC
Battery Remaining Longevity: 123.6 mo
Battery Voltage: 3.1 V
Brady Statistic RA Percent Paced: 2.3 %
Brady Statistic RV Percent Paced: 11 %
Date Time Interrogation Session: 20160427145153
Implantable Pulse Generator Model: 2240
Implantable Pulse Generator Serial Number: 7756161
Lead Channel Impedance Value: 437.5 Ohm
Lead Channel Impedance Value: 587.5 Ohm
Lead Channel Pacing Threshold Amplitude: 0.5 V
Lead Channel Pacing Threshold Amplitude: 0.5 V
Lead Channel Pacing Threshold Amplitude: 0.75 V
Lead Channel Pacing Threshold Amplitude: 0.75 V
Lead Channel Pacing Threshold Pulse Width: 0.5 ms
Lead Channel Pacing Threshold Pulse Width: 0.5 ms
Lead Channel Pacing Threshold Pulse Width: 0.5 ms
Lead Channel Pacing Threshold Pulse Width: 0.5 ms
Lead Channel Sensing Intrinsic Amplitude: 12 mV
Lead Channel Sensing Intrinsic Amplitude: 3.9 mV
Lead Channel Setting Pacing Amplitude: 3.5 V
Lead Channel Setting Pacing Amplitude: 3.5 V
Lead Channel Setting Pacing Pulse Width: 0.5 ms
Lead Channel Setting Sensing Sensitivity: 2 mV

## 2014-09-30 NOTE — Progress Notes (Signed)
Wound check appointment. Steri-strips removed. Wound without redness or edema. Incision edges approximated, wound well healed. Normal device function. Thresholds, sensing, and impedances consistent with implant measurements. Device programmed at 3.5V for extra safety margin until 3 month visit. Histogram distribution appropriate for patient and level of activity. 3.9% mode switch +plavix, longest 32min34sec. 1 high ventricular rate---RVR. Patient educated about wound care, arm mobility, lifting restrictions. ROV w/ GT 12/29/14.

## 2014-10-03 ENCOUNTER — Other Ambulatory Visit: Payer: Self-pay | Admitting: Internal Medicine

## 2014-10-23 ENCOUNTER — Ambulatory Visit: Payer: Medicare Other | Admitting: Internal Medicine

## 2014-10-23 ENCOUNTER — Ambulatory Visit: Payer: 59 | Admitting: Internal Medicine

## 2014-10-23 ENCOUNTER — Ambulatory Visit: Payer: Medicare Other | Admitting: Family Medicine

## 2014-10-26 ENCOUNTER — Ambulatory Visit (INDEPENDENT_AMBULATORY_CARE_PROVIDER_SITE_OTHER): Payer: Medicare Other | Admitting: Family Medicine

## 2014-10-26 ENCOUNTER — Encounter: Payer: Self-pay | Admitting: Family Medicine

## 2014-10-26 VITALS — BP 140/80 | HR 88 | Temp 97.8°F | Resp 16 | Ht 72.0 in | Wt 309.0 lb

## 2014-10-26 DIAGNOSIS — E785 Hyperlipidemia, unspecified: Secondary | ICD-10-CM

## 2014-10-26 DIAGNOSIS — E119 Type 2 diabetes mellitus without complications: Secondary | ICD-10-CM | POA: Diagnosis not present

## 2014-10-26 DIAGNOSIS — E669 Obesity, unspecified: Secondary | ICD-10-CM

## 2014-10-26 DIAGNOSIS — E1169 Type 2 diabetes mellitus with other specified complication: Secondary | ICD-10-CM

## 2014-10-26 DIAGNOSIS — N400 Enlarged prostate without lower urinary tract symptoms: Secondary | ICD-10-CM

## 2014-10-26 DIAGNOSIS — G4733 Obstructive sleep apnea (adult) (pediatric): Secondary | ICD-10-CM | POA: Diagnosis not present

## 2014-10-26 LAB — COMPREHENSIVE METABOLIC PANEL
ALT: 15 U/L (ref 0–53)
AST: 22 U/L (ref 0–37)
Albumin: 4.3 g/dL (ref 3.5–5.2)
Alkaline Phosphatase: 93 U/L (ref 39–117)
BUN: 11 mg/dL (ref 6–23)
CO2: 26 mEq/L (ref 19–32)
Calcium: 9.1 mg/dL (ref 8.4–10.5)
Chloride: 101 mEq/L (ref 96–112)
Creat: 1.26 mg/dL (ref 0.50–1.35)
Glucose, Bld: 129 mg/dL — ABNORMAL HIGH (ref 70–99)
Potassium: 3.8 mEq/L (ref 3.5–5.3)
Sodium: 138 mEq/L (ref 135–145)
Total Bilirubin: 1 mg/dL (ref 0.2–1.2)
Total Protein: 7.5 g/dL (ref 6.0–8.3)

## 2014-10-26 LAB — LIPID PANEL
Cholesterol: 155 mg/dL (ref 0–200)
HDL: 45 mg/dL (ref >=40)
LDL Cholesterol: 82 mg/dL (ref 0–99)
Total CHOL/HDL Ratio: 3.4 Ratio
Triglycerides: 142 mg/dL (ref <150)
VLDL: 28 mg/dL (ref 0–40)

## 2014-10-26 LAB — HEMOGLOBIN A1C
Hgb A1c MFr Bld: 6.1 % — ABNORMAL HIGH (ref <5.7)
Mean Plasma Glucose: 128 mg/dL — ABNORMAL HIGH (ref <117)

## 2014-10-26 MED ORDER — CLOPIDOGREL BISULFATE 75 MG PO TABS: 75.0000 mg | ORAL_TABLET | Freq: Every day | ORAL | Status: DC

## 2014-10-26 MED ORDER — ESOMEPRAZOLE MAGNESIUM 40 MG PO CPDR: DELAYED_RELEASE_CAPSULE | ORAL | Status: DC

## 2014-10-26 MED ORDER — RAPAFLO 8 MG PO CAPS: 8.0000 mg | ORAL_CAPSULE | Freq: Every day | ORAL | Status: DC

## 2014-10-26 NOTE — Assessment & Plan Note (Signed)
Recheck lipids on statin drug  

## 2014-10-26 NOTE — Patient Instructions (Signed)
Continue current medications Keep watching what you eat We will call with lab results F/U 4 months

## 2014-10-26 NOTE — Assessment & Plan Note (Signed)
Continue Rapaflo, dose well with this, needs PSA at CPE

## 2014-10-26 NOTE — Assessment & Plan Note (Signed)
Continues to benefit from CPAP machine

## 2014-10-26 NOTE — Assessment & Plan Note (Signed)
Diet controlled, concern this has worsened due to weight gain

## 2014-10-26 NOTE — Progress Notes (Signed)
Patient ID: Peter Dunlap, male   DOB: 1956-01-11, 59 y.o.   MRN: [id number]   Subjective:    Patient ID: Peter Dunlap, male    DOB: 1956-03-22, 59 y.o.   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 Eulas Post  Review Of Systems:  GEN- denies fatigue, fever, weight loss,weakness, recent illness HEENT- denies eye drainage, change in vision, nasal discharge, CVS- denies chest pain, palpitations RESP- denies SOB, cough, wheeze ABD- denies N/V, change in stools, abd pain GU- denies dysuria, hematuria, dribbling, incontinence MSK-+ joint pain, muscle aches, injury Neuro- denies headache, dizziness, syncope, seizure activity       Objective:    BP 140/80 mmHg  Pulse 88  Temp(Src) 97.8 F (36.6 C) (Oral)  Resp 16  Ht 6' (1.829 m)  Wt 309 lb (140.161 kg)  BMI 41.90 kg/m2  SpO2 98% GEN- NAD, alert and oriented x3, obese HEENT- PERRL, EOMI, non injected sclera, pink conjunctiva, MMM, oropharynx clear Neck- Supple, no LAD, no thyromegaly CVS- irregular rhythm, normal rate, no murmur RESP-CTAB EXT- No edema  Pulses- Radial,  2+       Assessment & Plan:      Problem List Items Addressed This Visit    OSA (obstructive sleep apnea)   Obesity (BMI 30-39.9) (Chronic)    Continues to benefit from CPAP machine      Hyperlipidemia (Chronic)    Recheck lipids on statin drug      Relevant Orders   Lipid panel   Diabetes mellitus type 2 in obese - Primary (Chronic)    Diet controlled, concern this has worsened due  to weight gain      Relevant Orders   Comprehensive metabolic panel   Hemoglobin A1c   BPH (benign prostatic hypertrophy)    Continue Rapaflo, dose well with this, needs PSA at CPE      Relevant Medications   RAPAFLO 8 MG CAPS capsule      Note: This dictation was prepared with Dragon dictation along with smaller phrase technology. Any transcriptional errors that result from this process are unintentional.

## 2014-10-30 ENCOUNTER — Encounter (HOSPITAL_COMMUNITY): Payer: Self-pay

## 2014-11-03 ENCOUNTER — Encounter: Payer: Self-pay | Admitting: Internal Medicine

## 2014-11-07 ENCOUNTER — Encounter: Payer: Self-pay | Admitting: Internal Medicine

## 2014-11-16 ENCOUNTER — Encounter: Payer: Self-pay | Admitting: Family

## 2014-11-17 DIAGNOSIS — G4733 Obstructive sleep apnea (adult) (pediatric): Secondary | ICD-10-CM | POA: Diagnosis not present

## 2014-11-19 ENCOUNTER — Other Ambulatory Visit: Payer: Self-pay | Admitting: Family Medicine

## 2014-11-19 ENCOUNTER — Ambulatory Visit (INDEPENDENT_AMBULATORY_CARE_PROVIDER_SITE_OTHER): Payer: Medicare Other | Admitting: Family

## 2014-11-19 ENCOUNTER — Encounter: Payer: Self-pay | Admitting: Family

## 2014-11-19 ENCOUNTER — Ambulatory Visit (HOSPITAL_COMMUNITY)
Admission: RE | Admit: 2014-11-19 | Discharge: 2014-11-19 | Disposition: A | Discharge status: Home / Self Care | Payer: Medicare Other | Source: Ambulatory Visit | Attending: Family | Admitting: Family

## 2014-11-19 VITALS — BP 109/73 | HR 92 | Resp 16 | Ht 72.0 in | Wt 311.0 lb

## 2014-11-19 DIAGNOSIS — Z48812 Encounter for surgical aftercare following surgery on the circulatory system: Secondary | ICD-10-CM | POA: Diagnosis not present

## 2014-11-19 DIAGNOSIS — I6523 Occlusion and stenosis of bilateral carotid arteries: Secondary | ICD-10-CM | POA: Diagnosis not present

## 2014-11-19 DIAGNOSIS — I6522 Occlusion and stenosis of left carotid artery: Secondary | ICD-10-CM | POA: Insufficient documentation

## 2014-11-19 DIAGNOSIS — Z87891 Personal history of nicotine dependence: Secondary | ICD-10-CM

## 2014-11-19 DIAGNOSIS — Z9889 Other specified postprocedural states: Secondary | ICD-10-CM

## 2014-11-19 NOTE — Patient Instructions (Signed)
Stroke Prevention Some medical conditions and behaviors are associated with an increased chance of having a stroke. You may prevent a stroke by making healthy choices and managing medical conditions. HOW CAN I REDUCE MY RISK OF HAVING A STROKE?   Stay physically active. Get at least 30 minutes of activity on most or all days.  Do not smoke. It may also be helpful to avoid exposure to secondhand smoke.  Limit alcohol use. Moderate alcohol use is considered to be:  No more than 2 drinks per day for men.  No more than 1 drink per day for nonpregnant women.  Eat healthy foods. This involves:  Eating 5 or more servings of fruits and vegetables a day.  Making dietary changes that address high blood pressure (hypertension), high cholesterol, diabetes, or obesity.  Manage your cholesterol levels.  Making food choices that are high in fiber and low in saturated fat, trans fat, and cholesterol may control cholesterol levels.  Take any prescribed medicines to control cholesterol as directed by your health care provider.  Manage your diabetes.  Controlling your carbohydrate and sugar intake is recommended to manage diabetes.  Take any prescribed medicines to control diabetes as directed by your health care provider.  Control your hypertension.  Making food choices that are low in salt (sodium), saturated fat, trans fat, and cholesterol is recommended to manage hypertension.  Take any prescribed medicines to control hypertension as directed by your health care provider.  Maintain a healthy weight.  Reducing calorie intake and making food choices that are low in sodium, saturated fat, trans fat, and cholesterol are recommended to manage weight.  Stop drug abuse.  Avoid taking birth control pills.  Talk to your health care provider about the risks of taking birth control pills if you are over 35 years old, smoke, get migraines, or have ever had a blood clot.  Get evaluated for sleep  disorders (sleep apnea).  Talk to your health care provider about getting a sleep evaluation if you snore a lot or have excessive sleepiness.  Take medicines only as directed by your health care provider.  For some people, aspirin or blood thinners (anticoagulants) are helpful in reducing the risk of forming abnormal blood clots that can lead to stroke. If you have the irregular heart rhythm of atrial fibrillation, you should be on a blood thinner unless there is a good reason you cannot take them.  Understand all your medicine instructions.  Make sure that other conditions (such as anemia or atherosclerosis) are addressed. SEEK IMMEDIATE MEDICAL CARE IF:   You have sudden weakness or numbness of the face, arm, or leg, especially on one side of the body.  Your face or eyelid droops to one side.  You have sudden confusion.  You have trouble speaking (aphasia) or understanding.  You have sudden trouble seeing in one or both eyes.  You have sudden trouble walking.  You have dizziness.  You have a loss of balance or coordination.  You have a sudden, severe headache with no known cause.  You have new chest pain or an irregular heartbeat. Any of these symptoms may represent a serious problem that is an emergency. Do not wait to see if the symptoms will go away. Get medical help at once. Call your local emergency services (911 in [place] Do not drive yourself to the hospital. Document Released: 06/29/2004 Document Revised: 10/06/2013 Document Reviewed: 11/22/2012 Exit[employer] Patient Information 2015 [employer]. This information is not intended to replace advice given   to you by your health care provider. Make sure you discuss any questions you have with your health care provider.  

## 2014-11-19 NOTE — Progress Notes (Signed)
Established Carotid Patient   History of Present Illness  Peter Dunlap is a 59 y.o. male patient of Dr. Donnetta Hutching seen for annual carotid duplex status post right CEA in February 2010.   Patient has Negative history of TIA or stroke symptom. The patient denies amaurosis fugax or monocular blindness. The patient denies facial drooping. Pt. denies hemiplegia. The patient denies receptive or expressive aphasia. Pt. denies extremity weakness. Pt denies claudication symptoms with walking, denies non healing wounds. He reports "bulging discs" in his low back, has occasional numbness at anterior thigh. States he has severe OA and needs a right knee replacement. Has rotator cuff issues in both shoulders.   He states that his heart rhythm remains irregular and that his cardiologist, Dr. Crissie Sickles,  is aware. Pt states he has had 2-3 MI's.  He has had 2 MI's, last one approximately 2009.  Pt reports New Medical or Surgical History: had thyroid ablation March 2016 for hyperthyroidism, is being titrated on levothyroxine now.   He was hospitalized at [facility] end of 2014 for CHF, states he has been stabilized lately.  Pt Diabetic: No, states recent A1C was normal, has 2 sisters with DM Pt smoker: former smoker, quit in 2014  Pt meds include: Statin : Yes ASA: Yes Other anticoagulants/antiplatelets: Plavix. He was on coumadin for atrial fib and this was stopped after he had an intracranial bleed in 2015.  Past Medical History  Diagnosis Date  . Hypertension     Severe LVH with normal EF  . Arteriosclerotic cardiovascular disease (ASCVD) 2005    catheterization in 10/2010:50% mid LAD, diffuse distal disease, circumflex irregularities, large dominant RCA with a 50% ostial, 70% distal, 60% posterolateral and 70% PDA; normal EF  . Cerebrovascular disease 2010    R. carotid endarterectomy; Duplex in 10/2010-widely patent ICAs, subtotal left vertebral-not thought to be contributing to symptoms   . Hyperlipidemia   . Obesity   . Tobacco abuse     Quit 2014  . Benign prostatic hypertrophy   . Low back pain   . History of PSVT (paroxysmal supraventricular tachycardia) 2005    Diagnosed with ILR; no recurrence following RFA  . Cervical spine disease     CT in 2012-advanced degeneration and spondylosis with moderate spinal stenosis at C3-C6  . H/O: substance abuse     Cocaine, marijuana, alcohol.  Quit 2013.   [name] Syncope   . Gastroesophageal reflux disease   . Depression   . Erectile dysfunction   . CHF (congestive heart failure)   . H/O hiatal hernia   . Headache(784.0)   . Arthritis   . Pericardial effusion without cardiac tamponade 12/03/2013  . Non-ST elevation myocardial infarction (NSTEMI), initial episode of care 12/02/2013    DES LAD  . Thyroid disease   . Sleep apnea     CPAP  . Tachy-brady syndrome   . Presence of permanent cardiac pacemaker 09/18/2014  . Carotid artery occlusion     Social History History  Substance Use Topics  . Smoking status: Former Smoker -- 1.00 packs/day for 40 years    Types: Cigarettes    Start date: 10/20/1972    Quit date: 10/10/2012  . Smokeless tobacco: Never Used     Comment: Quit in May.   . Alcohol Use: No     Comment: former drinker-- sober since 2013.     Family History Family History  Problem Relation Age of Onset  . Hypertension Mother     Cerebrovascular disease  .  Diabetes Mother   . Coronary artery disease Father 33  . Diabetes type II Father   . Hypertension Father   . Heart attack Father   . Lung cancer Paternal Uncle   . Diabetes Sister   . Hypertension Sister   . Heart attack Sister 10  . Diabetes Brother   . Hypertension Brother     Surgical History Past Surgical History  Procedure Laterality Date  . Radiofrequency ablation  2005    for PSVT  . Coronary angioplasty with stent placement  12/03/2013    LAD 90%-->0% W/ Promus Premier DES 3.0 mm x 16 mm, CFX OK, RCA 40%, EF 70-75%  . Carotid  endarterectomy Right Feb. 25, 2010     CEA  . Trudee Kuster hole Right 04/13/2014    Procedure: Haskell Flirt;  Surgeon: Charlie Pitter, MD;  Location: [place] [facility];  Service: Neurosurgery;  Laterality: Right;  . Left heart catheterization with coronary angiogram Left 12/03/2013    Procedure: LEFT HEART CATHETERIZATION WITH CORONARY ANGIOGRAM;  Surgeon: Leonie Man, MD;  Location: [facility] CATH LAB;  Service: Cardiovascular;  Laterality: Left;  . Percutaneous coronary stent intervention (pci-s)  12/03/2013    Procedure: PERCUTANEOUS CORONARY STENT INTERVENTION (PCI-S);  Surgeon: Leonie Man, MD;  Location: [facility] CATH LAB;  Service: Cardiovascular;;  . Left heart catheterization with coronary angiogram N/A 01/26/2014    Procedure: LEFT HEART CATHETERIZATION WITH CORONARY ANGIOGRAM;  Surgeon: Jettie Booze, MD;  Location: [facility] CATH LAB;  Service: Cardiovascular;  Laterality: N/A;  . Left heart catheterization with coronary angiogram N/A 08/03/2014    Procedure: LEFT HEART CATHETERIZATION WITH CORONARY ANGIOGRAM;  Surgeon: Burnell Blanks, MD;  Location: [facility] CATH LAB;  Service: Cardiovascular;  Laterality: N/A;  . Permanent pacemaker insertion N/A 09/18/2014    Procedure: PERMANENT PACEMAKER INSERTION;  Surgeon: [name], MD;  Location: [facility] CATH LAB;  Service: Cardiovascular;  Laterality: N/A;    Allergies  Allergen Reactions  . Lactose Intolerance (Gi) Other (See Comments)    UPSET STOMACH     Current Outpatient Prescriptions  Medication Sig Dispense Refill  . acetaminophen (TYLENOL) 500 MG tablet Take 500 mg by mouth every 6 (six) hours as needed for moderate pain.    [name] clopidogrel (PLAVIX) 75 MG tablet Take 1 tablet (75 mg total) by mouth daily. 30 tablet 5  . colchicine 0.6 MG tablet Take 1 tablet (0.6 mg total) by mouth 2 (two) times daily as needed (for gout). 14 tablet 1  . esomeprazole (NEXIUM) 40 MG capsule TAKE 1 CAPSULE BY MOUTH EVERY DAY AT 12 NOON 30 capsule 3  . furosemide (LASIX) 20  MG tablet Take 3 tablets (60 mg total) by mouth daily. 90 tablet 3  . HYDROcodone-acetaminophen (NORCO) 10-325 MG per tablet Take 1 tablet by mouth 3 (three) times daily.  0  . isosorbide mononitrate (IMDUR) 60 MG 24 hr tablet Take 1 tablet (60 mg total) by mouth daily. 90 tablet 3  . levothyroxine (SYNTHROID, LEVOTHROID) 150 MCG tablet Take 150 mcg by mouth daily before breakfast.    . nitroGLYCERIN (NITROSTAT) 0.4 MG SL tablet Place 1 tablet (0.4 mg total) under the tongue every 5 (five) minutes as needed for chest pain. 25 tablet 12  . potassium chloride SA (K-DUR,KLOR-CON) 20 MEQ tablet TAKE 1 TABLET BY MOUTH EVERY DAY 30 tablet 6  . RAPAFLO 8 MG CAPS capsule Take 1 capsule (8 mg total) by mouth daily. 30 capsule 5  . simvastatin (ZOCOR) 40  MG tablet Take 40 mg by mouth daily.     . traZODone (DESYREL) 50 MG tablet Take 0.5-1 tablets (25-50 mg total) by mouth at bedtime as needed for sleep. 30 tablet 3   No current facility-administered medications for this visit.   Facility-Administered Medications Ordered in Other Visits  Medication Dose Route Frequency Provider Last Rate Last Dose  . 0.9 %  sodium chloride infusion   Intravenous Continuous [name], MD        Review of Systems : See HPI for pertinent positives and negatives.  Physical Examination  Filed Vitals:   11/19/14 1035 11/19/14 1038  BP: 112/75 109/73  Pulse: 93 92  Resp:  16  Height:  6' (1.829 m)  Weight:  311 lb (141.069 kg)  SpO2:  99%   Body mass index is 42.17 kg/(m^2).  General: WDWN morbidly obese male in NAD GAIT: normal Eyes: PERRLA Pulmonary: Non-labored, CTAB, Negative Rales, Negative rhonchi, & Negative wheezing.  Cardiac: irregular Rhythm. Pacemaker palpated left upper chest.  VASCULAR EXAM Carotid Bruits Left Right   Negative Negative   Aorta is not palpable. Radial pulses are 2+ palpable and equal.       Gastrointestinal: soft, nontender, BS WNL, no r/g, no palpable masses.  Musculoskeletal: Negative muscle atrophy/wasting. M/S 4/5 throughout, Extremities without ischemic changes.  Neurologic: A&O X 3; Appropriate Affect,  Speech is normal CN 2-12 intact, Pain and light touch intact in extremities, Motor exam as listed above.        Non-Invasive Vascular Imaging CAROTID DUPLEX 11/19/2014   CEREBROVASCULAR DUPLEX EVALUATION    INDICATION: Carotid artery disease    PREVIOUS INTERVENTION(S): Right carotid endarterectomy 07/30/2008    DUPLEX EXAM: Carotid duplex    RIGHT  LEFT  Peak Systolic Velocities (cm/s) End Diastolic Velocities (cm/s) Plaque LOCATION Peak Systolic Velocities (cm/s) End Diastolic Velocities (cm/s) Plaque  105 14 - CCA PROXIMAL 122 19 -  108 15 - CCA MID 84 18 -  80 11 - CCA DISTAL 76 19 -  97 8 - ECA 173 13 -  33 11 HT ICA PROXIMAL 66 23 HT  46 15 - ICA MID 71 27 HT  63 23 - ICA DISTAL 68 27 -    N/A ICA / CCA Ratio (PSV) .84  Antegrade Vertebral Flow Antegrade   782 Brachial Systolic Pressure (mmHg) 956  Triphasic Brachial Artery Waveforms Triphasic    Plaque Morphology:  HM = Homogeneous, HT = Heterogeneous, CP = Calcific Plaque, SP = Smooth Plaque, IP = Irregular Plaque     ADDITIONAL FINDINGS:     IMPRESSION: 1. Patent right carotid endarterectomy with no evidence for restenosis. 2. 1 - 49 % left internal carotid artery stenosis.      Compared to the previous exam:  No significant change      Assessment: Peter Dunlap is a 59 y.o. male who is status post right CEA in February 2010.  He has no history of stroke or MI. He does have a significant cardiac history.  Today's carotid Duplex suggests a patent right carotid endarterectomy with no evidence for restenosis and 1 - 49 % left internal carotid artery stenosis. No significant  change in a year.    Plan: Follow-up in 1 year with Carotid Duplex.   I discussed in depth with the patient the nature of atherosclerosis, and emphasized the importance of maximal medical management including strict control of blood pressure, blood glucose, and lipid levels, obtaining regular exercise, and continued  cessation of smoking.  The patient is aware that without maximal medical management the underlying atherosclerotic disease process will progress, limiting the benefit of any interventions. The patient was given information about stroke prevention and what symptoms should prompt the patient to seek immediate medical care. Thank you for allowing [place] to participate in this patient's care.  Clemon Chambers, RN, MSN, FNP-C [facility] Office: [phone]  Clinic Physician: Scot Dock  11/19/2014 10:53 AM

## 2014-11-21 NOTE — Telephone Encounter (Signed)
Refill appropriate and filled per protocol. 

## 2014-11-25 DIAGNOSIS — G4733 Obstructive sleep apnea (adult) (pediatric): Secondary | ICD-10-CM | POA: Diagnosis not present

## 2014-12-07 ENCOUNTER — Encounter: Payer: Self-pay | Admitting: Internal Medicine

## 2014-12-08 ENCOUNTER — Encounter: Payer: Self-pay | Admitting: Internal Medicine

## 2014-12-14 DIAGNOSIS — M179 Osteoarthritis of knee, unspecified: Secondary | ICD-10-CM | POA: Diagnosis not present

## 2014-12-18 DIAGNOSIS — G4733 Obstructive sleep apnea (adult) (pediatric): Secondary | ICD-10-CM | POA: Diagnosis not present

## 2014-12-19 ENCOUNTER — Encounter (HOSPITAL_COMMUNITY): Payer: Self-pay | Admitting: *Deleted

## 2014-12-19 ENCOUNTER — Emergency Department (HOSPITAL_COMMUNITY): Payer: Medicare Other

## 2014-12-19 ENCOUNTER — Other Ambulatory Visit: Payer: Self-pay

## 2014-12-19 ENCOUNTER — Inpatient Hospital Stay (HOSPITAL_COMMUNITY)
Admission: EM | Admit: 2014-12-19 | Discharge: 2014-12-21 | DRG: 313 | Disposition: A | Discharge status: Home / Self Care | Payer: Medicare Other | Attending: Internal Medicine | Admitting: Internal Medicine

## 2014-12-19 DIAGNOSIS — I471 Supraventricular tachycardia, unspecified: Secondary | ICD-10-CM | POA: Diagnosis present

## 2014-12-19 DIAGNOSIS — R0789 Other chest pain: Principal | ICD-10-CM | POA: Diagnosis present

## 2014-12-19 DIAGNOSIS — I422 Other hypertrophic cardiomyopathy: Secondary | ICD-10-CM

## 2014-12-19 DIAGNOSIS — I679 Cerebrovascular disease, unspecified: Secondary | ICD-10-CM | POA: Diagnosis not present

## 2014-12-19 DIAGNOSIS — E039 Hypothyroidism, unspecified: Secondary | ICD-10-CM | POA: Diagnosis not present

## 2014-12-19 DIAGNOSIS — K219 Gastro-esophageal reflux disease without esophagitis: Secondary | ICD-10-CM | POA: Diagnosis present

## 2014-12-19 DIAGNOSIS — E876 Hypokalemia: Secondary | ICD-10-CM | POA: Diagnosis not present

## 2014-12-19 DIAGNOSIS — R079 Chest pain, unspecified: Secondary | ICD-10-CM | POA: Diagnosis not present

## 2014-12-19 DIAGNOSIS — E119 Type 2 diabetes mellitus without complications: Secondary | ICD-10-CM | POA: Diagnosis not present

## 2014-12-19 DIAGNOSIS — I509 Heart failure, unspecified: Secondary | ICD-10-CM | POA: Diagnosis present

## 2014-12-19 DIAGNOSIS — N182 Chronic kidney disease, stage 2 (mild): Secondary | ICD-10-CM | POA: Diagnosis present

## 2014-12-19 DIAGNOSIS — Z91048 Other nonmedicinal substance allergy status: Secondary | ICD-10-CM

## 2014-12-19 DIAGNOSIS — I2510 Atherosclerotic heart disease of native coronary artery without angina pectoris: Secondary | ICD-10-CM | POA: Diagnosis not present

## 2014-12-19 DIAGNOSIS — G4730 Sleep apnea, unspecified: Secondary | ICD-10-CM | POA: Diagnosis present

## 2014-12-19 DIAGNOSIS — Z950 Presence of cardiac pacemaker: Secondary | ICD-10-CM | POA: Diagnosis not present

## 2014-12-19 DIAGNOSIS — N400 Enlarged prostate without lower urinary tract symptoms: Secondary | ICD-10-CM | POA: Diagnosis present

## 2014-12-19 DIAGNOSIS — R7989 Other specified abnormal findings of blood chemistry: Secondary | ICD-10-CM | POA: Diagnosis not present

## 2014-12-19 DIAGNOSIS — Z6841 Body Mass Index (BMI) 40.0 and over, adult: Secondary | ICD-10-CM

## 2014-12-19 DIAGNOSIS — Z87891 Personal history of nicotine dependence: Secondary | ICD-10-CM

## 2014-12-19 DIAGNOSIS — I421 Obstructive hypertrophic cardiomyopathy: Secondary | ICD-10-CM | POA: Diagnosis present

## 2014-12-19 DIAGNOSIS — E669 Obesity, unspecified: Secondary | ICD-10-CM | POA: Diagnosis present

## 2014-12-19 DIAGNOSIS — I252 Old myocardial infarction: Secondary | ICD-10-CM | POA: Diagnosis not present

## 2014-12-19 DIAGNOSIS — E782 Mixed hyperlipidemia: Secondary | ICD-10-CM | POA: Diagnosis present

## 2014-12-19 DIAGNOSIS — N289 Disorder of kidney and ureter, unspecified: Secondary | ICD-10-CM | POA: Diagnosis not present

## 2014-12-19 DIAGNOSIS — F329 Major depressive disorder, single episode, unspecified: Secondary | ICD-10-CM | POA: Diagnosis present

## 2014-12-19 DIAGNOSIS — I110 Hypertensive heart disease with heart failure: Secondary | ICD-10-CM | POA: Diagnosis present

## 2014-12-19 DIAGNOSIS — I130 Hypertensive heart and chronic kidney disease with heart failure and stage 1 through stage 4 chronic kidney disease, or unspecified chronic kidney disease: Secondary | ICD-10-CM | POA: Diagnosis present

## 2014-12-19 DIAGNOSIS — E785 Hyperlipidemia, unspecified: Secondary | ICD-10-CM | POA: Diagnosis present

## 2014-12-19 DIAGNOSIS — R072 Precordial pain: Secondary | ICD-10-CM | POA: Diagnosis not present

## 2014-12-19 DIAGNOSIS — Z955 Presence of coronary angioplasty implant and graft: Secondary | ICD-10-CM

## 2014-12-19 DIAGNOSIS — S065X9D Traumatic subdural hemorrhage with loss of consciousness of unspecified duration, subsequent encounter: Secondary | ICD-10-CM

## 2014-12-19 DIAGNOSIS — I480 Paroxysmal atrial fibrillation: Secondary | ICD-10-CM | POA: Diagnosis present

## 2014-12-19 DIAGNOSIS — M1990 Unspecified osteoarthritis, unspecified site: Secondary | ICD-10-CM | POA: Diagnosis not present

## 2014-12-19 DIAGNOSIS — E1169 Type 2 diabetes mellitus with other specified complication: Secondary | ICD-10-CM

## 2014-12-19 DIAGNOSIS — Z8679 Personal history of other diseases of the circulatory system: Secondary | ICD-10-CM

## 2014-12-19 DIAGNOSIS — R42 Dizziness and giddiness: Secondary | ICD-10-CM | POA: Diagnosis not present

## 2014-12-19 DIAGNOSIS — E66813 Obesity, class 3: Secondary | ICD-10-CM | POA: Diagnosis present

## 2014-12-19 DIAGNOSIS — Z9889 Other specified postprocedural states: Secondary | ICD-10-CM

## 2014-12-19 DIAGNOSIS — R778 Other specified abnormalities of plasma proteins: Secondary | ICD-10-CM

## 2014-12-19 DIAGNOSIS — Z7902 Long term (current) use of antithrombotics/antiplatelets: Secondary | ICD-10-CM

## 2014-12-19 DIAGNOSIS — E1149 Type 2 diabetes mellitus with other diabetic neurological complication: Secondary | ICD-10-CM

## 2014-12-19 LAB — CBC WITH DIFFERENTIAL/PLATELET
Basophils Absolute: 0.1 10*3/uL (ref 0.0–0.1)
Basophils Relative: 1 % (ref 0–1)
Eosinophils Absolute: 0.2 10*3/uL (ref 0.0–0.7)
Eosinophils Relative: 2 % (ref 0–5)
HCT: 47 % (ref 39.0–52.0)
Hemoglobin: 16.3 g/dL (ref 13.0–17.0)
Lymphocytes Relative: 42 % (ref 12–46)
Lymphs Abs: 3.6 10*3/uL (ref 0.7–4.0)
MCH: 31.8 pg (ref 26.0–34.0)
MCHC: 34.7 g/dL (ref 30.0–36.0)
MCV: 91.6 fL (ref 78.0–100.0)
Monocytes Absolute: 0.8 10*3/uL (ref 0.1–1.0)
Monocytes Relative: 9 % (ref 3–12)
Neutro Abs: 4 10*3/uL (ref 1.7–7.7)
Neutrophils Relative %: 46 % (ref 43–77)
Platelets: 204 10*3/uL (ref 150–400)
RBC: 5.13 MIL/uL (ref 4.22–5.81)
RDW: 13.3 % (ref 11.5–15.5)
WBC: 8.6 10*3/uL (ref 4.0–10.5)

## 2014-12-19 NOTE — ED Provider Notes (Signed)
CSN: [id number]     Arrival date & time 12/19/14  2318 History   This chart was scribed for Peter Fuel, MD by Chester Holstein, ED Scribe. This patient was seen in room A02C/A02C and the patient's care was started at 12:04 AM.    Chief Complaint  Patient presents with  . Chest Pain     The history is provided by the patient. No language interpreter was used.   HPI Comments: DEMIR TITSWORTH is a 59 y.o. male with h/o stent placement, pacemaker insertion, ASCVD, CHF, NSTEMI, pericardial effusion, HTN, HLD, PSVT, polysubstance abuse, GERD, and thyroid disease who presents to the Emergency Department complaining of intermittent 7-8/10 chest pain, lasting as long as an hour with onset today. pt reports last episode was around 10 PM resolving around 30 minutes later but denies any pain currently. Pt report he has felt "sluggish" for last 2 days. He notes associated dizziness. He also reports some diarrhea. Pt has tried taking NTG and ASA ( 3, 81 mg) with brief relief. Pt denies SOB, nausea, vomiting, and diaphoresis.   Past Medical History  Diagnosis Date  . Hypertension     Severe LVH with normal EF  . Arteriosclerotic cardiovascular disease (ASCVD) 2005    catheterization in 10/2010:50% mid LAD, diffuse distal disease, circumflex irregularities, large dominant RCA with a 50% ostial, 70% distal, 60% posterolateral and 70% PDA; normal EF  . Cerebrovascular disease 2010    R. carotid endarterectomy; Duplex in 10/2010-widely patent ICAs, subtotal left vertebral-not thought to be contributing to symptoms  . Hyperlipidemia   . Obesity   . Tobacco abuse     Quit 2014  . Benign prostatic hypertrophy   . Low back pain   . History of PSVT (paroxysmal supraventricular tachycardia) 2005    Diagnosed with ILR; no recurrence following RFA  . Cervical spine disease     CT in 2012-advanced degeneration and spondylosis with moderate spinal stenosis at C3-C6  . H/O: substance abuse     Cocaine, marijuana,  alcohol.  Quit 2013.   [name] Syncope   . Gastroesophageal reflux disease   . Depression   . Erectile dysfunction   . CHF (congestive heart failure)   . H/O hiatal hernia   . Headache(784.0)   . Arthritis   . Pericardial effusion without cardiac tamponade 12/03/2013  . Non-ST elevation myocardial infarction (NSTEMI), initial episode of care 12/02/2013    DES LAD  . Thyroid disease   . Sleep apnea     CPAP  . Tachy-brady syndrome   . Presence of permanent cardiac pacemaker 09/18/2014  . Carotid artery occlusion    Past Surgical History  Procedure Laterality Date  . Radiofrequency ablation  2005    for PSVT  . Coronary angioplasty with stent placement  12/03/2013    LAD 90%-->0% W/ Promus Premier DES 3.0 mm x 16 mm, CFX OK, RCA 40%, EF 70-75%  . Carotid endarterectomy Right Feb. 25, 2010     CEA  . Trudee Kuster hole Right 04/13/2014    Procedure: Haskell Flirt;  Surgeon: Charlie Pitter, MD;  Location: [place] [facility] OR[facility];  Service: Neurosurgery;  Laterality: Right;  . Left heart catheterization with coronary angiogram Left 12/03/2013    Procedure: LEFT HEART CATHETERIZATION WITH CORONARY ANGIOGRAM;  Surgeon: Leonie Man, MD;  Location: [facility] CATH LAB;  Service: Cardiovascular;  Laterality: Left;  . Percutaneous coronary stent intervention (pci-s)  12/03/2013    Procedure: PERCUTANEOUS CORONARY STENT INTERVENTION (PCI-S);  Surgeon: Shanon Brow  Loren Racer, MD;  Location: [facility] CATH LAB;  Service: Cardiovascular;;  . Left heart catheterization with coronary angiogram N/A 01/26/2014    Procedure: LEFT HEART CATHETERIZATION WITH CORONARY ANGIOGRAM;  Surgeon: Jettie Booze, MD;  Location: [facility] CATH LAB;  Service: Cardiovascular;  Laterality: N/A;  . Left heart catheterization with coronary angiogram N/A 08/03/2014    Procedure: LEFT HEART CATHETERIZATION WITH CORONARY ANGIOGRAM;  Surgeon: Burnell Blanks, MD;  Location: [facility] CATH LAB;  Service: Cardiovascular;  Laterality: N/A;  . Permanent pacemaker insertion N/A  09/18/2014    Procedure: PERMANENT PACEMAKER INSERTION;  Surgeon: [name], MD;  Location: [facility] CATH LAB;  Service: Cardiovascular;  Laterality: N/A;   Family History  Problem Relation Age of Onset  . Hypertension Mother     Cerebrovascular disease  . Diabetes Mother   . Coronary artery disease Father 13  . Diabetes type II Father   . Hypertension Father   . Heart attack Father   . Lung cancer Paternal Uncle   . Diabetes Sister   . Hypertension Sister   . Heart attack Sister 47  . Diabetes Brother   . Hypertension Brother    History  Substance Use Topics  . Smoking status: Former Smoker -- 1.00 packs/day for 40 years    Types: Cigarettes    Start date: 10/20/1972    Quit date: 10/10/2012  . Smokeless tobacco: Never Used     Comment: Quit in May.   . Alcohol Use: No     Comment: former drinker-- sober since 2013.     Review of Systems  Constitutional: Negative for diaphoresis.  Respiratory: Negative for shortness of breath.   Cardiovascular: Positive for chest pain.  Gastrointestinal: Positive for diarrhea. Negative for nausea and vomiting.  Neurological: Positive for dizziness.  All other systems reviewed and are negative.     Allergies  Lactose intolerance (gi)  Home Medications   Prior to Admission medications   Medication Sig Start Date End Date Taking? Authorizing Provider  acetaminophen (TYLENOL) 500 MG tablet Take 500 mg by mouth every 6 (six) hours as needed for moderate pain.    Historical Provider, MD  clopidogrel (PLAVIX) 75 MG tablet Take 1 tablet (75 mg total) by mouth daily. 10/26/14   [name], MD  colchicine 0.6 MG tablet Take 1 tablet (0.6 mg total) by mouth 2 (two) times daily as needed (for gout). 09/19/14   Isaiah Serge, NP  esomeprazole (NEXIUM) 40 MG capsule TAKE 1 CAPSULE BY MOUTH EVERY DAY AT 12 NOON 10/26/14   [name], MD  furosemide (LASIX) 20 MG tablet Take 3 tablets (60 mg total) by mouth daily. 09/07/14   [name], MD  HYDROcodone-acetaminophen (NORCO) 10-325 MG per tablet Take 1 tablet by mouth 3 (three) times daily. 08/26/14   Historical Provider, MD  isosorbide mononitrate (IMDUR) 60 MG 24 hr tablet Take 1 tablet (60 mg total) by mouth daily. 02/16/14   Burtis Junes, NP  levothyroxine (SYNTHROID, LEVOTHROID) 150 MCG tablet Take 150 mcg by mouth daily before breakfast.    Historical Provider, MD  nitroGLYCERIN (NITROSTAT) 0.4 MG SL tablet Place 1 tablet (0.4 mg total) under the tongue every 5 (five) minutes as needed for chest pain. 12/05/13   Rhonda G Barrett, PA-C  potassium chloride SA (K-DUR,KLOR-CON) 20 MEQ tablet TAKE 1 TABLET BY MOUTH EVERY DAY 10/05/14   [name], MD  RAPAFLO 8 MG CAPS capsule Take 1 capsule (8 mg total)  by mouth daily. 10/26/14   [name], MD  simvastatin (ZOCOR) 40 MG tablet Take 40 mg by mouth daily.     Historical Provider, MD  traZODone (DESYREL) 50 MG tablet TAKE 1/2 TO 1 TABLET BY MOUTH AT BEDTIME AS NEEDED FOR SLEEP 11/21/14   [name], MD   BP 111/65 mmHg  Pulse 84  Temp(Src) 97.8 F (36.6 C) (Oral)  Resp 22  SpO2 98% Physical Exam  Constitutional: He is oriented to person, place, and time. He appears well-developed and well-nourished.  HENT:  Head: Normocephalic and atraumatic.  Eyes: Conjunctivae are normal. Pupils are equal, round, and reactive to light.  Neck: Normal range of motion. Neck supple. No JVD present.  Cardiovascular: Normal rate.  An irregular rhythm present.  Murmur heard.  Systolic (holosystolic at base with radiation to the neck) murmur is present  Pulmonary/Chest: Effort normal and breath sounds normal. He has no wheezes. He has no rales. He exhibits no tenderness.  pacemaker present left subclavian  Abdominal: Soft. Bowel sounds are normal. He exhibits no distension and no mass. There is no tenderness.  Musculoskeletal: Normal range of motion. He exhibits no edema.  Lymphadenopathy:    He has no cervical adenopathy.   Neurological: He is alert and oriented to person, place, and time. No cranial nerve deficit. He exhibits normal muscle tone. Coordination normal.  Skin: Skin is warm and dry. No rash noted.  Psychiatric: He has a normal mood and affect. His behavior is normal. Judgment and thought content normal.  Nursing note and vitals reviewed.   ED Course  Procedures (including critical care time) DIAGNOSTIC STUDIES: Oxygen Saturation is 96% on room air, normal by my interpretation.    COORDINATION OF CARE: 12:13 AM Discussed treatment plan with patient at beside, the patient agrees with the plan and has no further questions at this time.   Labs Review Labs Reviewed  CBC WITH DIFFERENTIAL/PLATELET  BASIC METABOLIC PANEL  TROPONIN I  BRAIN NATRIURETIC PEPTIDE    Imaging Review Results for orders placed or performed during the hospital encounter of 93/26/71  Basic metabolic panel  Result Value Ref Range   Sodium 138 135 - 145 mmol/L   Potassium 3.5 3.5 - 5.1 mmol/L   Chloride 104 101 - 111 mmol/L   CO2 29 22 - 32 mmol/L   Glucose, Bld 119 (H) 65 - 99 mg/dL   BUN 11 6 - 20 mg/dL   Creatinine, Ser 1.30 (H) 0.61 - 1.24 mg/dL   Calcium 8.9 8.9 - 10.3 mg/dL   GFR calc non Af Amer 59 (L) >60 mL/min   GFR calc Af Amer >60 >60 mL/min   Anion gap 5 5 - 15  Troponin I  Result Value Ref Range   Troponin I 0.05 (H) <0.031 ng/mL  CBC with Differential  Result Value Ref Range   WBC 8.6 4.0 - 10.5 K/uL   RBC 5.13 4.22 - 5.81 MIL/uL   Hemoglobin 16.3 13.0 - 17.0 g/dL   HCT 47.0 39.0 - 52.0 %   MCV 91.6 78.0 - 100.0 fL   MCH 31.8 26.0 - 34.0 pg   MCHC 34.7 30.0 - 36.0 g/dL   RDW 13.3 11.5 - 15.5 %   Platelets 204 150 - 400 K/uL   Neutrophils Relative % 46 43 - 77 %   Neutro Abs 4.0 1.7 - 7.7 K/uL   Lymphocytes Relative 42 12 - 46 %   Lymphs Abs 3.6 0.7 - 4.0 K/uL  Monocytes Relative 9 3 - 12 %   Monocytes Absolute 0.8 0.1 - 1.0 K/uL   Eosinophils Relative 2 0 - 5 %   Eosinophils Absolute  0.2 0.0 - 0.7 K/uL   Basophils Relative 1 0 - 1 %   Basophils Absolute 0.1 0.0 - 0.1 K/uL  Brain natriuretic peptide  Result Value Ref Range   B Natriuretic Peptide 77.2 0.0 - 100.0 pg/mL   Dg Chest 2 View  12/19/2014   CLINICAL DATA:  Chest pain today.  Hypertension.  Nonsmoker.  EXAM: CHEST  2 VIEW  COMPARISON:  09/19/2014  FINDINGS: Cardiac pacemaker. Normal heart size and pulmonary vascularity. No focal airspace disease or consolidation in the lungs. No blunting of costophrenic angles. No pneumothorax. Mediastinal contours appear intact.  IMPRESSION: No active cardiopulmonary disease.   Electronically Signed   By: Lucienne Capers M.D.   On: 12/19/2014 23:59      MDM   Final diagnoses:  Chest pain, unspecified chest pain type  Elevated troponin I level  Renal insufficiency    Patient with known history of coronary artery disease and symptoms worrisome for unstable angina. Review of old records shows she did have a heart catheterization in February of this year which showed patent stent in an obtuse marginal artery off of the circumflex, and 2 lesions in the right coronary system that were at 60% but no other near critical lesions. Given this recent heart catheterization, likelihood of actual unstable angina is very low. But symptoms are worrisome enough that he will need to be admitted. Also, he had a recent subdural hematoma, so anticoagulation will be withheld.   Unenhanced come back mildly elevated. Mild renal insufficiency is present and unchanged from baseline. Case is discussed with Dr. Claiborne Billings of cardiology service who agrees with not pursuing anticoagulation at this point. He requests patient be admitted to medicine service given recent catheterization not showing any critical lesions. Case is discussed with Dr. Roel Cluck of [facility] who agrees to admit the patient.  I personally performed the services described in this documentation, which was scribed in my presence. The  recorded information has been reviewed and is accurate.       Peter Fuel, MD 01/75/10 2585

## 2014-12-19 NOTE — ED Notes (Signed)
The pt is c/o lt upper chest with lt arm radiation all day  He is also having some diarrhea. He had a st judes pacemaker placed  2 months ago.  The pain comes and goes

## 2014-12-20 ENCOUNTER — Encounter (HOSPITAL_COMMUNITY): Payer: Self-pay | Admitting: Internal Medicine

## 2014-12-20 DIAGNOSIS — I422 Other hypertrophic cardiomyopathy: Secondary | ICD-10-CM | POA: Diagnosis present

## 2014-12-20 DIAGNOSIS — I252 Old myocardial infarction: Secondary | ICD-10-CM | POA: Diagnosis not present

## 2014-12-20 DIAGNOSIS — Z950 Presence of cardiac pacemaker: Secondary | ICD-10-CM | POA: Diagnosis not present

## 2014-12-20 DIAGNOSIS — E785 Hyperlipidemia, unspecified: Secondary | ICD-10-CM | POA: Diagnosis present

## 2014-12-20 DIAGNOSIS — I679 Cerebrovascular disease, unspecified: Secondary | ICD-10-CM | POA: Diagnosis not present

## 2014-12-20 DIAGNOSIS — Z87891 Personal history of nicotine dependence: Secondary | ICD-10-CM | POA: Diagnosis not present

## 2014-12-20 DIAGNOSIS — Z6841 Body Mass Index (BMI) 40.0 and over, adult: Secondary | ICD-10-CM | POA: Diagnosis not present

## 2014-12-20 DIAGNOSIS — M1990 Unspecified osteoarthritis, unspecified site: Secondary | ICD-10-CM | POA: Diagnosis present

## 2014-12-20 DIAGNOSIS — I421 Obstructive hypertrophic cardiomyopathy: Secondary | ICD-10-CM | POA: Diagnosis not present

## 2014-12-20 DIAGNOSIS — I130 Hypertensive heart and chronic kidney disease with heart failure and stage 1 through stage 4 chronic kidney disease, or unspecified chronic kidney disease: Secondary | ICD-10-CM | POA: Diagnosis present

## 2014-12-20 DIAGNOSIS — R072 Precordial pain: Secondary | ICD-10-CM | POA: Diagnosis not present

## 2014-12-20 DIAGNOSIS — R7989 Other specified abnormal findings of blood chemistry: Secondary | ICD-10-CM | POA: Diagnosis not present

## 2014-12-20 DIAGNOSIS — G4730 Sleep apnea, unspecified: Secondary | ICD-10-CM | POA: Diagnosis present

## 2014-12-20 DIAGNOSIS — E669 Obesity, unspecified: Secondary | ICD-10-CM | POA: Diagnosis present

## 2014-12-20 DIAGNOSIS — I480 Paroxysmal atrial fibrillation: Secondary | ICD-10-CM | POA: Diagnosis not present

## 2014-12-20 DIAGNOSIS — Z7902 Long term (current) use of antithrombotics/antiplatelets: Secondary | ICD-10-CM | POA: Diagnosis not present

## 2014-12-20 DIAGNOSIS — E119 Type 2 diabetes mellitus without complications: Secondary | ICD-10-CM | POA: Diagnosis present

## 2014-12-20 DIAGNOSIS — N400 Enlarged prostate without lower urinary tract symptoms: Secondary | ICD-10-CM | POA: Diagnosis present

## 2014-12-20 DIAGNOSIS — I471 Supraventricular tachycardia: Secondary | ICD-10-CM | POA: Diagnosis present

## 2014-12-20 DIAGNOSIS — E876 Hypokalemia: Secondary | ICD-10-CM | POA: Diagnosis not present

## 2014-12-20 DIAGNOSIS — R079 Chest pain, unspecified: Secondary | ICD-10-CM

## 2014-12-20 DIAGNOSIS — S065X9D Traumatic subdural hemorrhage with loss of consciousness of unspecified duration, subsequent encounter: Secondary | ICD-10-CM | POA: Diagnosis not present

## 2014-12-20 DIAGNOSIS — F329 Major depressive disorder, single episode, unspecified: Secondary | ICD-10-CM | POA: Diagnosis present

## 2014-12-20 DIAGNOSIS — R0789 Other chest pain: Secondary | ICD-10-CM | POA: Diagnosis not present

## 2014-12-20 DIAGNOSIS — N182 Chronic kidney disease, stage 2 (mild): Secondary | ICD-10-CM | POA: Diagnosis present

## 2014-12-20 DIAGNOSIS — I2510 Atherosclerotic heart disease of native coronary artery without angina pectoris: Secondary | ICD-10-CM

## 2014-12-20 DIAGNOSIS — Z955 Presence of coronary angioplasty implant and graft: Secondary | ICD-10-CM | POA: Diagnosis not present

## 2014-12-20 DIAGNOSIS — K219 Gastro-esophageal reflux disease without esophagitis: Secondary | ICD-10-CM | POA: Diagnosis present

## 2014-12-20 DIAGNOSIS — Z91048 Other nonmedicinal substance allergy status: Secondary | ICD-10-CM | POA: Diagnosis not present

## 2014-12-20 DIAGNOSIS — E039 Hypothyroidism, unspecified: Secondary | ICD-10-CM | POA: Diagnosis present

## 2014-12-20 DIAGNOSIS — I509 Heart failure, unspecified: Secondary | ICD-10-CM | POA: Diagnosis present

## 2014-12-20 LAB — COMPREHENSIVE METABOLIC PANEL
ALT: 15 U/L — ABNORMAL LOW (ref 17–63)
AST: 21 U/L (ref 15–41)
Albumin: 3.3 g/dL — ABNORMAL LOW (ref 3.5–5.0)
Alkaline Phosphatase: 83 U/L (ref 38–126)
Anion gap: 6 (ref 5–15)
BUN: 11 mg/dL (ref 6–20)
CO2: 26 mmol/L (ref 22–32)
Calcium: 8.8 mg/dL — ABNORMAL LOW (ref 8.9–10.3)
Chloride: 106 mmol/L (ref 101–111)
Creatinine, Ser: 1.16 mg/dL (ref 0.61–1.24)
GFR calc Af Amer: >60 mL/min (ref >60)
GFR calc non Af Amer: >60 mL/min (ref >60)
Glucose, Bld: 96 mg/dL (ref 65–99)
Potassium: 3.3 mmol/L — ABNORMAL LOW (ref 3.5–5.1)
Sodium: 138 mmol/L (ref 135–145)
Total Bilirubin: 1 mg/dL (ref 0.3–1.2)
Total Protein: 7.1 g/dL (ref 6.5–8.1)

## 2014-12-20 LAB — BASIC METABOLIC PANEL
Anion gap: 5 (ref 5–15)
BUN: 11 mg/dL (ref 6–20)
CO2: 29 mmol/L (ref 22–32)
Calcium: 8.9 mg/dL (ref 8.9–10.3)
Chloride: 104 mmol/L (ref 101–111)
Creatinine, Ser: 1.3 mg/dL — ABNORMAL HIGH (ref 0.61–1.24)
GFR calc Af Amer: >60 mL/min (ref >60)
GFR calc non Af Amer: 59 mL/min — ABNORMAL LOW (ref >60)
Glucose, Bld: 119 mg/dL — ABNORMAL HIGH (ref 65–99)
Potassium: 3.5 mmol/L (ref 3.5–5.1)
Sodium: 138 mmol/L (ref 135–145)

## 2014-12-20 LAB — LIPID PANEL
Cholesterol: 127 mg/dL (ref 0–200)
HDL: 33 mg/dL — ABNORMAL LOW (ref >40)
LDL Cholesterol: 77 mg/dL (ref 0–99)
Total CHOL/HDL Ratio: 3.8 RATIO
Triglycerides: 84 mg/dL (ref <150)
VLDL: 17 mg/dL (ref 0–40)

## 2014-12-20 LAB — CBC
HCT: 43.6 % (ref 39.0–52.0)
Hemoglobin: 15.4 g/dL (ref 13.0–17.0)
MCH: 31.7 pg (ref 26.0–34.0)
MCHC: 35.3 g/dL (ref 30.0–36.0)
MCV: 89.7 fL (ref 78.0–100.0)
Platelets: 179 10*3/uL (ref 150–400)
RBC: 4.86 MIL/uL (ref 4.22–5.81)
RDW: 13.4 % (ref 11.5–15.5)
WBC: 6.6 10*3/uL (ref 4.0–10.5)

## 2014-12-20 LAB — MAGNESIUM: Magnesium: 1.7 mg/dL (ref 1.7–2.4)

## 2014-12-20 LAB — RAPID URINE DRUG SCREEN, HOSP PERFORMED
Amphetamines: NOT DETECTED
Barbiturates: NOT DETECTED
Benzodiazepines: NOT DETECTED
Cocaine: NOT DETECTED
Opiates: POSITIVE — AB
Tetrahydrocannabinol: NOT DETECTED

## 2014-12-20 LAB — TROPONIN I
Troponin I: 0.03 ng/mL (ref <0.031)
Troponin I: 0.04 ng/mL — ABNORMAL HIGH (ref <0.031)
Troponin I: 0.05 ng/mL — ABNORMAL HIGH (ref <0.031)
Troponin I: 0.05 ng/mL — ABNORMAL HIGH (ref <0.031)

## 2014-12-20 LAB — BRAIN NATRIURETIC PEPTIDE: B Natriuretic Peptide: 77.2 pg/mL (ref 0.0–100.0)

## 2014-12-20 LAB — PHOSPHORUS: Phosphorus: 3.7 mg/dL (ref 2.5–4.6)

## 2014-12-20 LAB — TSH: TSH: 1.648 u[IU]/mL (ref 0.350–4.500)

## 2014-12-20 MED ORDER — SIMVASTATIN 40 MG PO TABS
40.0000 mg | ORAL_TABLET | Freq: Every day | ORAL | Status: DC
  Administered 2014-12-20: 40 mg via ORAL
  Filled 2014-12-20: qty 1

## 2014-12-20 MED ORDER — LEVOTHYROXINE SODIUM 75 MCG PO TABS
150.0000 ug | ORAL_TABLET | Freq: Every day | ORAL | Status: DC
  Administered 2014-12-20 – 2014-12-21 (×2): 150 ug via ORAL
  Filled 2014-12-20 (×2): qty 2

## 2014-12-20 MED ORDER — ONDANSETRON HCL 4 MG PO TABS: 4.0000 mg | ORAL_TABLET | Freq: Four times a day (QID) | ORAL | Status: DC | PRN

## 2014-12-20 MED ORDER — ASPIRIN 81 MG PO CHEW
81.0000 mg | CHEWABLE_TABLET | Freq: Once | ORAL | Status: AC
  Administered 2014-12-20: 81 mg via ORAL
  Filled 2014-12-20: qty 1

## 2014-12-20 MED ORDER — ONDANSETRON HCL 4 MG/2ML IJ SOLN: 4.0000 mg | Freq: Four times a day (QID) | INTRAMUSCULAR | Status: DC | PRN

## 2014-12-20 MED ORDER — COLCHICINE 0.6 MG PO TABS: 0.6000 mg | ORAL_TABLET | Freq: Two times a day (BID) | ORAL | Status: DC | PRN

## 2014-12-20 MED ORDER — HYDROCODONE-ACETAMINOPHEN 10-325 MG PO TABS
1.0000 | ORAL_TABLET | Freq: Three times a day (TID) | ORAL | Status: DC
  Administered 2014-12-20 – 2014-12-21 (×4): 1 via ORAL
  Filled 2014-12-20 (×4): qty 1

## 2014-12-20 MED ORDER — PANTOPRAZOLE SODIUM 40 MG PO TBEC
40.0000 mg | DELAYED_RELEASE_TABLET | Freq: Every day | ORAL | Status: DC
  Administered 2014-12-20 – 2014-12-21 (×2): 40 mg via ORAL
  Filled 2014-12-20 (×2): qty 1

## 2014-12-20 MED ORDER — SODIUM CHLORIDE 0.9 % IJ SOLN
3.0000 mL | Freq: Two times a day (BID) | INTRAMUSCULAR | Status: DC
  Administered 2014-12-20: 3 mL via INTRAVENOUS

## 2014-12-20 MED ORDER — POTASSIUM CHLORIDE CRYS ER 20 MEQ PO TBCR
20.0000 meq | EXTENDED_RELEASE_TABLET | Freq: Every day | ORAL | Status: DC
  Administered 2014-12-20 – 2014-12-21 (×2): 20 meq via ORAL
  Filled 2014-12-20 (×2): qty 1

## 2014-12-20 MED ORDER — ACETAMINOPHEN 650 MG RE SUPP: 650.0000 mg | Freq: Four times a day (QID) | RECTAL | Status: DC | PRN

## 2014-12-20 MED ORDER — METOPROLOL TARTRATE 25 MG PO TABS
25.0000 mg | ORAL_TABLET | Freq: Two times a day (BID) | ORAL | Status: DC
  Administered 2014-12-20 – 2014-12-21 (×3): 25 mg via ORAL
  Filled 2014-12-20 (×3): qty 1

## 2014-12-20 MED ORDER — HYDROCODONE-ACETAMINOPHEN 5-325 MG PO TABS
1.0000 | ORAL_TABLET | ORAL | Status: DC | PRN
  Administered 2014-12-20: 2 via ORAL
  Filled 2014-12-20: qty 2

## 2014-12-20 MED ORDER — ISOSORBIDE MONONITRATE ER 60 MG PO TB24
60.0000 mg | ORAL_TABLET | Freq: Every day | ORAL | Status: DC
  Administered 2014-12-20 – 2014-12-21 (×2): 60 mg via ORAL
  Filled 2014-12-20 (×2): qty 1

## 2014-12-20 MED ORDER — MORPHINE SULFATE 2 MG/ML IJ SOLN: 2.0000 mg | INTRAMUSCULAR | Status: DC | PRN

## 2014-12-20 MED ORDER — MAGNESIUM OXIDE 400 (241.3 MG) MG PO TABS
400.0000 mg | ORAL_TABLET | Freq: Two times a day (BID) | ORAL | Status: DC
  Administered 2014-12-20 – 2014-12-21 (×3): 400 mg via ORAL
  Filled 2014-12-20 (×3): qty 1

## 2014-12-20 MED ORDER — TAMSULOSIN HCL 0.4 MG PO CAPS
0.4000 mg | ORAL_CAPSULE | Freq: Every day | ORAL | Status: DC
  Administered 2014-12-20 – 2014-12-21 (×2): 0.4 mg via ORAL
  Filled 2014-12-20 (×2): qty 1

## 2014-12-20 MED ORDER — ACETAMINOPHEN 325 MG PO TABS: 650.0000 mg | ORAL_TABLET | Freq: Four times a day (QID) | ORAL | Status: DC | PRN

## 2014-12-20 MED ORDER — CLOPIDOGREL BISULFATE 75 MG PO TABS
75.0000 mg | ORAL_TABLET | Freq: Every day | ORAL | Status: DC
  Administered 2014-12-20 – 2014-12-21 (×2): 75 mg via ORAL
  Filled 2014-12-20 (×2): qty 1

## 2014-12-20 MED ORDER — FUROSEMIDE 40 MG PO TABS
60.0000 mg | ORAL_TABLET | Freq: Every day | ORAL | Status: DC
  Administered 2014-12-20 – 2014-12-21 (×2): 60 mg via ORAL
  Filled 2014-12-20 (×4): qty 1

## 2014-12-20 NOTE — Consult Note (Addendum)
Reason for Consult: chest pain Primary Cardiologist:  Referring Physician: Dr. Sheldon Silvan is an 59 y.o. male.  HPI: [facility]lap is a 59 yo man with PMH of hypothyroidism, SVT s/p catheter ablation, hypertension, atrial fibrillation, coronary artery disease, hypertrophic cardiomyopathy vs. Long-standing hypertension with LVOT obstruction and SAM and tobacco abuse who had a dual-chamber pacemaker placed 09/18/14 for symptomatic bradycardia, last LHC 01/2014 demonstrating patent LAD stents, 80% proximal OM1 stenosis treated with DES with subsequent subdural hematoma requiring evacuation 04/13/14 leading to stopping aspirin and warfarin and continuing plavix only. He tells me he was very active on Thursday going to multiple stores and walking around. He walks his two year old lab/pitbull mix 1 block a few times a day typically without chest discomfort. Today, he noticed chest discomfort pressure sensation walking across the room. Resting improves his chest discomfort. He says it is not as severe as it has been before. He also notes some slightly weight gain but he doesn't think it is fluid. No recent falls or bleeding noted. He does say he noticed the chest discomfort yesterday as well.   Past Medical History  Diagnosis Date  . Hypertension     Severe LVH with normal EF  . Arteriosclerotic cardiovascular disease (ASCVD) 2005    catheterization in 10/2010:50% mid LAD, diffuse distal disease, circumflex irregularities, large dominant RCA with a 50% ostial, 70% distal, 60% posterolateral and 70% PDA; normal EF  . Cerebrovascular disease 2010    R. carotid endarterectomy; Duplex in 10/2010-widely patent ICAs, subtotal left vertebral-not thought to be contributing to symptoms  . Hyperlipidemia   . Obesity   . Tobacco abuse     Quit 2014  . Benign prostatic hypertrophy   . Low back pain   . History of PSVT (paroxysmal supraventricular tachycardia) 2005    Diagnosed with ILR; no recurrence  following RFA  . Cervical spine disease     CT in 2012-advanced degeneration and spondylosis with moderate spinal stenosis at C3-C6  . H/O: substance abuse     Cocaine, marijuana, alcohol.  Quit 2013.   [name] Syncope   . Gastroesophageal reflux disease   . Depression   . Erectile dysfunction   . CHF (congestive heart failure)   . H/O hiatal hernia   . Headache(784.0)   . Arthritis   . Pericardial effusion without cardiac tamponade 12/03/2013  . Non-ST elevation myocardial infarction (NSTEMI), initial episode of care 12/02/2013    DES LAD  . Thyroid disease   . Sleep apnea     CPAP  . Tachy-brady syndrome   . Presence of permanent cardiac pacemaker 09/18/2014  . Carotid artery occlusion     Past Surgical History  Procedure Laterality Date  . Radiofrequency ablation  2005    for PSVT  . Coronary angioplasty with stent placement  12/03/2013    LAD 90%-->0% W/ Promus Premier DES 3.0 mm x 16 mm, CFX OK, RCA 40%, EF 70-75%  . Carotid endarterectomy Right Feb. 25, 2010     CEA  . Trudee Kuster hole Right 04/13/2014    Procedure: Haskell Flirt;  Surgeon: Charlie Pitter, MD;  Location: [facility];  Service: Neurosurgery;  Laterality: Right;  . Left heart catheterization with coronary angiogram Left 12/03/2013    Procedure: LEFT HEART CATHETERIZATION WITH CORONARY ANGIOGRAM;  Surgeon: Leonie Man, MD;  Location: [facility] CATH LAB;  Service: Cardiovascular;  Laterality: Left;  . Percutaneous coronary stent intervention (pci-s)  12/03/2013    Procedure:  PERCUTANEOUS CORONARY STENT INTERVENTION (PCI-S);  Surgeon: Leonie Man, MD;  Location: [facility] CATH LAB;  Service: Cardiovascular;;  . Left heart catheterization with coronary angiogram N/A 01/26/2014    Procedure: LEFT HEART CATHETERIZATION WITH CORONARY ANGIOGRAM;  Surgeon: Jettie Booze, MD;  Location: [facility] C[facility] LAB;  Service: Cardiovascular;  Laterality: N/A;  . Left heart catheterization with coronary angiogram N/A 08/03/2014    Procedure: LEFT HEART  CATHETERIZATION WITH CORONARY ANGIOGRAM;  Surgeon: Burnell Blanks, MD;  Location: [facility] CATH LAB;  Service: Cardiovascular;  Laterality: N/A;  . Permanent pacemaker insertion N/A 09/18/2014    Procedure: PERMANENT PACEMAKER INSERTION;  Surgeon: [name], MD;  Location: [facility] CATH LAB;  Service: Cardiovascular;  Laterality: N/A;    Family History  Problem Relation Age of Onset  . Hypertension Mother     Cerebrovascular disease  . Diabetes Mother   . Coronary artery disease Father 60  . Diabetes type II Father   . Hypertension Father   . Heart attack Father   . Lung cancer Paternal Uncle   . Diabetes Sister   . Hypertension Sister   . Heart attack Sister 57  . Diabetes Brother   . Hypertension Brother     Social History:  reports that he quit smoking about 2 years ago. His smoking use included Cigarettes. He started smoking about 42 years ago. He has a 40 pack-year smoking history. He has never used smokeless tobacco. He reports that he does not drink alcohol or use illicit drugs.  Allergies:  Allergies  Allergen Reactions  . Lactose Intolerance (Gi) Other (See Comments)    UPSET STOMACH     Medications: I have reviewed the patient's current medications. Prior to Admission:  (Not in a hospital admission) Scheduled:  Results for orders placed or performed during the hospital encounter of 12/19/14 (from the past 48 hour(s))  Basic metabolic panel     Status: Abnormal   Collection Time: 12/19/14 11:41 PM  Result Value Ref Range   Sodium 138 135 - 145 mmol/L   Potassium 3.5 3.5 - 5.1 mmol/L   Chloride 104 101 - 111 mmol/L   CO2 29 22 - 32 mmol/L   Glucose, Bld 119 (H) 65 - 99 mg/dL   BUN 11 6 - 20 mg/dL   Creatinine, Ser 1.30 (H) 0.61 - 1.24 mg/dL   Calcium 8.9 8.9 - 10.3 mg/dL   GFR calc non Af Amer 59 (L) >60 mL/min   GFR calc Af Amer >60 >60 mL/min    Comment: (NOTE) The eGFR has been calculated using the CKD EPI equation. This calculation has not been  validated in all clinical situations. eGFR's persistently <60 mL/min signify possible Chronic Kidney Disease.    Anion gap 5 5 - 15  Troponin I     Status: Abnormal   Collection Time: 12/19/14 11:41 PM  Result Value Ref Range   Troponin I 0.05 (H) <0.031 ng/mL    Comment:        PERSISTENTLY INCREASED TROPONIN VALUES IN THE RANGE OF 0.04-0.49 ng/mL CAN BE SEEN IN:       -UNSTABLE ANGINA       -CONGESTIVE HEART FAILURE       -MYOCARDITIS       -CHEST TRAUMA       -ARRYHTHMIAS       -LATE PRESENTING MYOCARDIAL INFARCTION       -COPD   CLINICAL FOLLOW-UP RECOMMENDED.   CBC with Differential     Status:  None   Collection Time: 12/19/14 11:41 PM  Result Value Ref Range   WBC 8.6 4.0 - 10.5 K/uL   RBC 5.13 4.22 - 5.81 MIL/uL   Hemoglobin 16.3 13.0 - 17.0 g/dL   HCT 47.0 39.0 - 52.0 %   MCV 91.6 78.0 - 100.0 fL   MCH 31.8 26.0 - 34.0 pg   MCHC 34.7 30.0 - 36.0 g/dL   RDW 13.3 11.5 - 15.5 %   Platelets 204 150 - 400 K/uL   Neutrophils Relative % 46 43 - 77 %   Neutro Abs 4.0 1.7 - 7.7 K/uL   Lymphocytes Relative 42 12 - 46 %   Lymphs Abs 3.6 0.7 - 4.0 K/uL   Monocytes Relative 9 3 - 12 %   Monocytes Absolute 0.8 0.1 - 1.0 K/uL   Eosinophils Relative 2 0 - 5 %   Eosinophils Absolute 0.2 0.0 - 0.7 K/uL   Basophils Relative 1 0 - 1 %   Basophils Absolute 0.1 0.0 - 0.1 K/uL  Brain natriuretic peptide     Status: None   Collection Time: 12/19/14 11:41 PM  Result Value Ref Range   B Natriuretic Peptide 77.2 0.0 - 100.0 pg/mL    Dg Chest 2 View  12/19/2014   CLINICAL DATA:  Chest pain today.  Hypertension.  Nonsmoker.  EXAM: CHEST  2 VIEW  COMPARISON:  09/19/2014  FINDINGS: Cardiac pacemaker. Normal heart size and pulmonary vascularity. No focal airspace disease or consolidation in the lungs. No blunting of costophrenic angles. No pneumothorax. Mediastinal contours appear intact.  IMPRESSION: No active cardiopulmonary disease.   Electronically Signed   By: Lucienne Capers M.D.    On: 12/19/2014 23:59    Review of Systems  Constitutional: Positive for malaise/fatigue. Negative for fever, chills and weight loss.  HENT: Negative for tinnitus.   Eyes: Negative for double vision and photophobia.  Respiratory: Negative for cough.   Cardiovascular: Positive for chest pain. Negative for orthopnea and leg swelling.  Gastrointestinal: Positive for diarrhea. Negative for vomiting and abdominal pain.  Genitourinary: Negative for dysuria and hematuria.  Musculoskeletal: Negative for myalgias and neck pain.  Skin: Negative for rash.  Neurological: Negative for dizziness, tingling, speech change and headaches.  Endo/Heme/Allergies: Negative for polydipsia.  Psychiatric/Behavioral: Negative for suicidal ideas and substance abuse.   Blood pressure 114/69, pulse 83, temperature 97.8 F (36.6 C), temperature source Oral, resp. rate 19, SpO2 97 %. Physical Exam  Nursing note and vitals reviewed. Constitutional: He is oriented to person, place, and time. He appears well-developed and well-nourished. No distress.  HENT:  Head: Normocephalic and atraumatic.  Nose: Nose normal.  Mouth/Throat: Oropharynx is clear and moist. No oropharyngeal exudate.  Eyes: Conjunctivae and EOM are normal. Pupils are equal, round, and reactive to light. No scleral icterus.  Neck: Normal range of motion. Neck supple. No JVD present. No tracheal deviation present.  Cardiovascular: Normal rate, regular rhythm, normal heart sounds and intact distal pulses.  Exam reveals no gallop.   No murmur heard. Respiratory: Effort normal and breath sounds normal. No respiratory distress. He has no wheezes.  GI: Soft. Bowel sounds are normal. He exhibits no distension. There is no tenderness. There is no rebound.  Musculoskeletal: Normal range of motion. He exhibits no edema or tenderness.  Neurological: He is alert and oriented to person, place, and time. No cranial nerve deficit. Coordination normal.  Skin: Skin is  dry. No rash noted. He is not diaphoretic. No erythema.  Psychiatric: He  has a normal mood and affect. His behavior is normal. Thought content normal.   Labs reviewed; na 138, K 3.5, bun/cr 11/1.3, BNP 77, Trop I 0.05, plt 204, h/h 16/47, wbc 8 Chest x-ray: no acute process, dual chamber PM EKG: SR with PACS, LVH with repolarization 7/15 Echo reviewed; EF 65-70%, LVH, small pericardial effusion, LV gradient  08/03/14 LHC: diffuse 30 stenosis proximal and mid-LAD, mid stent widely patent, 30% stenosis proximal D3; patent OM stent, 30% proximal, mid and distal RCA, posterolateral branch ostial 60% - stable, PDA with ostial 60%, stable Assessment/Plan: [facility] Dunlap is a 59 yo man with PMH of hypothyroidism, SVT s/p catheter ablation, hypertension, atrial fibrillation, coronary artery disease, hypertrophic cardiomyopathy vs. Long-standing hypertension with LVOT obstruction and SAM and tobacco abuse who had a dual-chamber pacemaker placed 09/18/14 for symptomatic bradycardia who presents with chest pain. Differential diagnosis is musculoskeletal pain, esophageal spasm, GERD, pericarditis, ACS/NSTEMI among other etiologies. [facility] Dunlap is medically complex and has had a recent stable coronary angiogram late February 2016. With LVOT obstruction as well, his symptoms are difficult to characterize with components of typical angina (worse with exertion/improves with rest). For now, I favor trending troponin, watching on telemetry and updating echocardiogram for now. He is on imdur 60 mg, SL NTG. He previously was on beta-blocker but this was stopped before his pacemaker. I think addition of beta-blocker again for HOCM and as antianginal makes sense. Otherwise, can potentially increase imdur and can consider ranexa as well.  Problem List Chest Pain Mildly elevated troponin Hypertrophic Obstructive Cardiomyopathy with SAM noted previously  Coronary artery disease  Hypertension Subdural hematoma November 2015  requiring evacuation Chronic Kidney Disease stage II/III  Plan: 1. Watch on telemetry, continue plavix 2. Start metoprolol 12.5 mg bid as anti-anginal 3. Trend cardiac biomarkers 4. Update echocardiogram in AM 5. Continue home imdur 60 mg daily KELLY, JACOB 12/20/2014, 1:02 AM

## 2014-12-20 NOTE — Progress Notes (Signed)
[facility]                                                                              Patient Demographics  Peter Dunlap, is a 59 y.o. male, DOB - 1955-10-04, XBD:[id number]  Admit date - 12/19/2014   Admitting Physician Toy Baker, MD  Outpatient Primary MD for the patient is [name], MD  LOS - 0   Chief Complaint  Patient presents with  . Chest Pain       Brief HPI   Patient is a 59 year old male with hypertension, hyperlipidemia, tobacco abuse, BPH, PSVT, GERD, NSTEMI in 2015, SDH presented to [facility] with chest pain radiating to the left arm. Patient reported intermittent chest pain in the past 24 hours prior to admission lasting about one hour. Patient reported pain worse with ambulation, associated with lightheadedness.  Patient attempted to take nitroglycerin as well as aspirin it helped somewhat but then recurred. Patient did not endorse nausea vomiting or shortness of breath. Patient presented to emergency department and was found to have elevated troponin up to 0.05. ECG non acute. Cardiology was consulted and patient was admitted for further workup.   Assessment & Plan    Principal Problem:   Chest pain with underlying history of CAD LAD DES 12/03/13, hypertrophic obstructive cardiomyopathy hypertension and atrial fibrillation - Elevated troponin, now trending down, cardiology was consulted, recommended to continue Plavix, started metoprolol, Imdur, obtain 2-D echocardiogram - Patient was seen by cardiology and recommended no further cardiac catheterization or stress testing at this time - Continue aspirin, Plavix, Lasix, Imdur, metoprolol, statin  Active Problems:   PSVT (paroxysmal supraventricular tachycardia) Patient was noted to have SVT with ventricular tracking, not on any AV nodal blocking agents, patient was started on Lopressor Replaced potassium and magnesium    Hypertension - Currently stable, continue Lasix, Imdur,  metoprolol    Subdural hematoma - Continue aspirin and Plavix, not on any anticoagulation, currently no neuro deficits    Atrial fibrillation - Currently rate controlled, not on any anti-medications secondary to subdural hematoma  Hypokalemia, hypomagnesemia Replaced   hypothyroidism Continue Synthroid  Code Status: Full code   Family Communication: Discussed in detail with the patient, all imaging results, lab results explained to the patient    Disposition Plan: Pending echocardiogram   Time Spent in minutes  25 minutes  Procedures  2-D echo pending   Consults   Cardiology   DVT Prophylaxis  SCDs   Medications  Scheduled Meds: . clopidogrel  75 mg Oral Daily  . furosemide  60 mg Oral Daily  . HYDROcodone-acetaminophen  1 tablet Oral TID  . isosorbide mononitrate  60 mg Oral Daily  . levothyroxine  150 mcg Oral QAC breakfast  . metoprolol tartrate  25 mg Oral BID  . pantoprazole  40 mg Oral Daily  . potassium chloride SA  20 mEq Oral Daily  . simvastatin  40 mg Oral q1800  . sodium chloride  3 mL Intravenous Q12H  . tamsulosin  0.4 mg Oral Daily   Continuous Infusions:  PRN Meds:.acetaminophen **OR** acetaminophen, colchicine, HYDROcodone-acetaminophen, morphine injection, ondansetron **OR** ondansetron (ZOFRAN) IV  Antibiotics   Anti-infectives    None        Subjective:   [facility] Dunlap was seen and examined today. Patient denies dizziness, chest pain, shortness of breath, abdominal pain, N/V/D/C, new weakness, numbess, tingling. No acute events overnight.    Objective:   Blood pressure 109/36, pulse 70, temperature 97.8 F (36.6 C), temperature source Oral, resp. rate 20, height 6' (1.829 m), weight 141.069 kg (311 lb), SpO2 99 %.  Wt Readings from Last 3 Encounters:  12/20/14 141.069 kg (311 lb)  11/19/14 141.069 kg (311 lb)  10/26/14 140.161 kg (309 lb)    No intake or output data in the 24 hours ending 12/20/14  1058  Exam  General: Alert and oriented x 3, NAD  HEENT:  PERRLA, EOMI, Anicteric Sclera, mucous membranes moist.   Neck: Supple, no JVD, no masses  CVS: S1 S2 auscultated,  apical systolic murmur  Pulmonary: Clear to auscultation bilaterally, no wheezing, rales or rhonchi  Abdomen: Soft, nontender, nondistended, + bowel sounds  Ext: no cyanosis clubbing or edema  Neuro: AAOx3, Cr N's II- XII. Strength 5/5 upper and lower extremities bilaterally  Skin: No rashes  Psych: Normal affect and demeanor, alert and oriented x3    Data Review   Micro Results No results found for this or any previous visit (from the past 240 hour(s)).  Radiology Reports Dg Chest 2 View  12/19/2014   CLINICAL DATA:  Chest pain today.  Hypertension.  Nonsmoker.  EXAM: CHEST  2 VIEW  COMPARISON:  09/19/2014  FINDINGS: Cardiac pacemaker. Normal heart size and pulmonary vascularity. No focal airspace disease or consolidation in the lungs. No blunting of costophrenic angles. No pneumothorax. Mediastinal contours appear intact.  IMPRESSION: No active cardiopulmonary disease.   Electronically Signed   By: Lucienne Capers M.D.   On: 12/19/2014 23:59    CBC  Recent Labs Lab 12/19/14 2341 12/20/14 0730  WBC 8.6 6.6  HGB 16.3 15.4  HCT 47.0 43.6  PLT 204 179  MCV 91.6 89.7  MCH 31.8 31.7  MCHC 34.7 35.3  RDW 13.3 13.4  LYMPHSABS 3.6  --   MONOABS 0.8  --   EOSABS 0.2  --   BASOSABS 0.1  --     Chemistries   Recent Labs Lab 12/19/14 2341 12/20/14 0730  NA 138 138  K 3.5 3.3*  CL 104 106  CO2 29 26  GLUCOSE 119* 96  BUN 11 11  CREATININE 1.30* 1.16  CALCIUM 8.9 8.8*  MG  --  1.7  AST  --  21  ALT  --  15*  ALKPHOS  --  83  BILITOT  --  1.0   ------------------------------------------------------------------------------------------------------------------ estimated creatinine clearance is 99.9 mL/min (by C-G formula based on Cr of  1.16). ------------------------------------------------------------------------------------------------------------------ No results for input(s): HGBA1C in the last 72 hours. ------------------------------------------------------------------------------------------------------------------  Recent Labs  12/20/14 0430  CHOL 127  HDL 33*  LDLCALC 77  TRIG 84  CHOLHDL 3.8   ------------------------------------------------------------------------------------------------------------------  Recent Labs  12/20/14 0430  TSH 1.648   ------------------------------------------------------------------------------------------------------------------ No results for input(s): VITAMINB12, FOLATE, FERRITIN, TIBC, IRON, RETICCTPCT in the last 72 hours.  Coagulation profile No results for input(s): INR, PROTIME in the last 168 hours.  No results for input(s): DDIMER in the last 72 hours.  Cardiac Enzymes  Recent Labs Lab 12/19/14 2341 12/20/14 0147 12/20/14 0730  TROPONINI 0.05* 0.05* 0.04*   ------------------------------------------------------------------------------------------------------------------ Invalid input(s): POCBNP  No results for input(s): GLUCAP in the last 72 hours.  RAI,RIPUDEEP M.D. [facility] 12/20/2014, 10:58 AM  Pager: [phone] Between 7am to 7pm - call Pager - [phone]  After 7pm go to [email or web address] - password [email or web address]  Call night coverage person covering after 7pm

## 2014-12-20 NOTE — ED Notes (Signed)
Report attempted 

## 2014-12-20 NOTE — H&P (Signed)
PCP:  [name], MD cardiology Dr. Rayann Heman Referring provider Roxanne Mins   Chief Complaint:  Chest pain  HPI: Peter Dunlap is a 59 y.o. male   has a past medical history of Hypertension; Arteriosclerotic cardiovascular disease (ASCVD) (2005); Cerebrovascular disease (2010); Hyperlipidemia; Obesity; Tobacco abuse; Benign prostatic hypertrophy; Low back pain; History of PSVT (paroxysmal supraventricular tachycardia) (2005); Cervical spine disease; H/O: substance abuse; Syncope; Gastroesophageal reflux disease; Depression; Erectile dysfunction; CHF (congestive heart failure); H/O hiatal hernia; Headache(784.0); Arthritis; Pericardial effusion without cardiac tamponade (12/03/2013); Non-ST elevation myocardial infarction (NSTEMI), initial episode of care (12/02/2013); Thyroid disease; Sleep apnea; Tachy-brady syndrome; 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   Presented with  Upper chest pain radiating to left arm. Pain has been coming and going for the past 24 hours.Episodes last about 1 hour. Worse with ambulation felt increase in pain with walking to the bathroom. Last episode was around 10 PM which have resolved after 30 minutes. He has been associated with feeling lightheaded as well as some diarrhea. Patient attempted to take nitroglycerin as well as aspirin it helped somewhat but then recurred. Patient did not endorse nausea vomiting or shortness of breath. Reports taking his medication.  Patient presented to emergency department and was found to have elevated troponin up to 0.05. ECG non acute.  Cardiology consult has been called. ER MD spoke to Dr. Claiborne Billings who will consult but requests medical admission. Not a candidate for hepatization given hx of Subdural on coumadin.  Currently chest pain-free   Patient has as been followed extensively by cardiology.   He has known history of coronary artery disease In June 2015 patient had non-ST elevation MI  and had DES to LAD he has been on Plavix. Last cardiac catheterization in february 2016 showing LAD: Diffuse 30% stenosis in the proximal and mid vessel. There is 30% stenosis in the proximal segment of the diagonal branch.  OM patent stent with no restenosis, Circ flex patent, RCA Large dominant vessel with 30% stenosis in the proximal, mid and distal vessel. The posterolateral branch has ostial 60% stenosis, unchanged from last cath. The PDA has ostial 60% stenosis, unchanged from last cath.  overall no focal targets for PCI recommendation to continue medical management.  Patient has history of tachybradycardia syndrome status post pacemaker in April 2016 for by Dr. Lovena Le. He has hypertrophic obstructive cardiomyopathy Also history of SVT status post catheter ablation 2005 without any recurrence.  Patient has known and atrial flutter.   He has history of subdural hematoma status post burr hole procedure in November and not a candidate for anticoagulation although has been on Plavix and tolerated that well.  Patient has history of steep apnea and using CPAP  Hospitalist was called for admission for chest pain with known coronary artery disease and elevated troponin  Review of Systems:    Pertinent positives include: chest pain, diarrhea, dizziness,  Constitutional:  No weight loss, night sweats, Fevers, chills, fatigue, weight loss  HEENT:  No headaches, Difficulty swallowing,Tooth/dental problems,Sore throat,  No sneezing, itching, ear ache, nasal congestion, post nasal drip,  Cardio-vascular:  No  Orthopnea, PND, anasarca,  palpitations.no Bilateral lower extremity swelling  GI:  No heartburn, indigestion, abdominal pain, nausea, vomiting,change in bowel habits, loss of appetite, melena, blood in stool, hematemesis Resp:  no shortness of breath at rest. No dyspnea on exertion, No excess mucus, no productive cough, No non-productive cough, No coughing up of blood.No change in color of  mucus.No wheezing.  Skin:  no rash or lesions. No jaundice GU:  no dysuria, change in color of urine, no urgency or frequency. No straining to urinate.  No flank pain.  Musculoskeletal:  No joint pain or no joint swelling. No decreased range of motion. No back pain.  Psych:  No change in mood or affect. No depression or anxiety. No memory loss.  Neuro: no localizing neurological complaints, no tingling, no weakness, no double vision, no gait abnormality, no slurred speech, no confusion  Otherwise ROS are negative except for above, 10 systems were reviewed  Past Medical History: Past Medical History  Diagnosis Date  . Hypertension     Severe LVH with normal EF  . Arteriosclerotic cardiovascular disease (ASCVD) 2005    catheterization in 10/2010:50% mid LAD, diffuse distal disease, circumflex irregularities, large dominant RCA with a 50% ostial, 70% distal, 60% posterolateral and 70% PDA; normal EF  . Cerebrovascular disease 2010    R. carotid endarterectomy; Duplex in 10/2010-widely patent ICAs, subtotal left vertebral-not thought to be contributing to symptoms  . Hyperlipidemia   . Obesity   . Tobacco abuse     Quit 2014  . Benign prostatic hypertrophy   . Low back pain   . History of PSVT (paroxysmal supraventricular tachycardia) 2005    Diagnosed with ILR; no recurrence following RFA  . Cervical spine disease     CT in 2012-advanced degeneration and spondylosis with moderate spinal stenosis at C3-C6  . H/O: substance abuse     Cocaine, marijuana, alcohol.  Quit 2013.   [name] Syncope   . Gastroesophageal reflux disease   . Depression   . Erectile dysfunction   . CHF (congestive heart failure)   . H/O hiatal hernia   . Headache(784.0)   . Arthritis   . Pericardial effusion without cardiac tamponade 12/03/2013  . Non-ST elevation myocardial infarction (NSTEMI), initial episode of care 12/02/2013    DES LAD  . Thyroid disease   . Sleep apnea     CPAP  . Tachy-brady syndrome   .  Presence of permanent cardiac pacemaker 09/18/2014  . Carotid artery occlusion    Past Surgical History  Procedure Laterality Date  . Radiofrequency ablation  2005    for PSVT  . Coronary angioplasty with stent placement  12/03/2013    LAD 90%-->0% W/ Promus Premier DES 3.0 mm x 16 mm, CFX OK, RCA 40%, EF 70-75%  . Carotid endarterectomy Right Feb. 25, 2010     CEA  . Trudee Kuster hole Right 04/13/2014    Procedure: Haskell Flirt;  Surgeon: Charlie Pitter, MD;  Location: [facility];  Service: Neurosurgery;  Laterality: Right;  . Left heart catheterization with coronary angiogram Left 12/03/2013    Procedure: LEFT HEART CATHETERIZATION WITH CORONARY ANGIOGRAM;  Surgeon: Leonie Man, MD;  Location: [facility] CATH LAB;  Service: Cardiovascular;  Laterality: Left;  . Percutaneous coronary stent intervention (pci-s)  12/03/2013    Procedure: PERCUTANEOUS CORONARY STENT INTERVENTION (PCI-S);  Surgeon: Leonie Man, MD;  Location: [facility] CATH LAB;  Service: Cardiovascular;;  . Left heart catheterization with coronary angiogram N/A 01/26/2014    Procedure: LEFT HEART CATHETERIZATION WITH CORONARY ANGIOGRAM;  Surgeon: Jettie Booze, MD;  Location: [facility] CATH LAB;  Service: Cardiovascular;  Laterality: N/A;  . Left heart catheterization with coronary angiogram N/A 08/03/2014    Procedure: LEFT HEART CATHETERIZATION WITH CORONARY ANGIOGRAM;  Surgeon: Burnell Blanks, MD;  Location: [facility] CATH LAB;  Service: Cardiovascular;  Laterality: N/A;  .  Permanent pacemaker insertion N/A 09/18/2014    Procedure: PERMANENT PACEMAKER INSERTION;  Surgeon: [name], MD;  Location: [facility] CATH LAB;  Service: Cardiovascular;  Laterality: N/A;     Medications: Prior to Admission medications   Medication Sig Start Date End Date Taking? Authorizing Provider  clopidogrel (PLAVIX) 75 MG tablet Take 1 tablet (75 mg total) by mouth daily. 10/26/14  Yes [name], MD  colchicine 0.6 MG tablet Take 1 tablet (0.6 mg total) by mouth  2 (two) times daily as needed (for gout). 09/19/14  Yes Isaiah Serge, NP  esomeprazole (NEXIUM) 40 MG capsule TAKE 1 CAPSULE BY MOUTH EVERY DAY AT 12 NOON 10/26/14  Yes [name], MD  furosemide (LASIX) 20 MG tablet Take 3 tablets (60 mg total) by mouth daily. 09/07/14  Yes [name], MD  HYDROcodone-acetaminophen (NORCO) 10-325 MG per tablet Take 1 tablet by mouth 3 (three) times daily. 08/26/14  Yes Historical Provider, MD  isosorbide mononitrate (IMDUR) 60 MG 24 hr tablet Take 1 tablet (60 mg total) by mouth daily. 02/16/14  Yes Burtis Junes, NP  levothyroxine (SYNTHROID, LEVOTHROID) 150 MCG tablet Take 150 mcg by mouth daily before breakfast.   Yes Historical Provider, MD  nitroGLYCERIN (NITROSTAT) 0.4 MG SL tablet Place 1 tablet (0.4 mg total) under the tongue every 5 (five) minutes as needed for chest pain. 12/05/13  Yes Rhonda G Barrett, PA-C  potassium chloride SA (K-DUR,KLOR-CON) 20 MEQ tablet TAKE 1 TABLET BY MOUTH EVERY DAY 10/05/14  Yes [name], MD  RAPAFLO 8 MG CAPS capsule Take 1 capsule (8 mg total) by mouth daily. 10/26/14  Yes [name], MD  simvastatin (ZOCOR) 40 MG tablet Take 40 mg by mouth daily.    Yes Historical Provider, MD  traZODone (DESYREL) 50 MG tablet TAKE 1/2 TO 1 TABLET BY MOUTH AT BEDTIME AS NEEDED FOR SLEEP Patient not taking: Reported on 12/20/2014 11/21/14   [name], MD    Allergies:   Allergies  Allergen Reactions  . Lactose Intolerance (Gi) Other (See Comments)    UPSET STOMACH     Social History:  Ambulatory   Independently or cain Lives at home  With family     reports that he quit smoking about 2 years ago. His smoking use included Cigarettes. He started smoking about 42 years ago. He has a 40 pack-year smoking history. He has never used smokeless tobacco. He reports that he does not drink alcohol or use illicit drugs.    Family History: family history includes Coronary artery disease (age of onset: 18) in his  father; Diabetes in his brother, mother, and sister; Diabetes type II in his father; Heart attack in his father; Heart attack (age of onset: 45) in his sister; Hypertension in his brother, father, mother, and sister; Lung cancer in his paternal uncle.    Physical Exam: Patient Vitals for the past 24 hrs:  BP Temp Temp src Pulse Resp SpO2  12/20/14 0045 114/69 mmHg - - 83 19 97 %  12/20/14 0030 133/57 mmHg - - (!) 55 20 99 %  12/20/14 0000 111/65 mmHg - - 84 22 98 %  12/19/14 2325 132/88 mmHg 97.8 F (36.6 C) Oral 92 18 96 %    1. General:  in No Acute distress 2. Psychological: Alert and       Oriented 3. Head/ENT:   Moist    Mucous Membranes  Head Non traumatic, neck supple                          Normal  Dentition 4. SKIN:  decreased Skin turgor,  Skin clean Dry and intact no rash 5. Heart: Regular rate and rhythm no Murmur, Rub or gallop 6. Lungs: Clear to auscultation bilaterally, no wheezes or crackles   7. Abdomen: Soft, non-tender, Non distended 8. Lower extremities: no clubbing, cyanosis, or edema 9. Neurologically Grossly intact, moving all 4 extremities equally 10. MSK: Normal range of motion  body mass index is unknown because there is no weight on file.   Labs on Admission:   Results for orders placed or performed during the hospital encounter of 12/19/14 (from the past 24 hour(s))  Basic metabolic panel     Status: Abnormal   Collection Time: 12/19/14 11:41 PM  Result Value Ref Range   Sodium 138 135 - 145 mmol/L   Potassium 3.5 3.5 - 5.1 mmol/L   Chloride 104 101 - 111 mmol/L   CO2 29 22 - 32 mmol/L   Glucose, Bld 119 (H) 65 - 99 mg/dL   BUN 11 6 - 20 mg/dL   Creatinine, Ser 1.30 (H) 0.61 - 1.24 mg/dL   Calcium 8.9 8.9 - 10.3 mg/dL   GFR calc non Af Amer 59 (L) >60 mL/min   GFR calc Af Amer >60 >60 mL/min   Anion gap 5 5 - 15  Troponin I     Status: Abnormal   Collection Time: 12/19/14 11:41 PM  Result Value Ref Range   Troponin  I 0.05 (H) <0.031 ng/mL  CBC with Differential     Status: None   Collection Time: 12/19/14 11:41 PM  Result Value Ref Range   WBC 8.6 4.0 - 10.5 K/uL   RBC 5.13 4.22 - 5.81 MIL/uL   Hemoglobin 16.3 13.0 - 17.0 g/dL   HCT 47.0 39.0 - 52.0 %   MCV 91.6 78.0 - 100.0 fL   MCH 31.8 26.0 - 34.0 pg   MCHC 34.7 30.0 - 36.0 g/dL   RDW 13.3 11.5 - 15.5 %   Platelets 204 150 - 400 K/uL   Neutrophils Relative % 46 43 - 77 %   Neutro Abs 4.0 1.7 - 7.7 K/uL   Lymphocytes Relative 42 12 - 46 %   Lymphs Abs 3.6 0.7 - 4.0 K/uL   Monocytes Relative 9 3 - 12 %   Monocytes Absolute 0.8 0.1 - 1.0 K/uL   Eosinophils Relative 2 0 - 5 %   Eosinophils Absolute 0.2 0.0 - 0.7 K/uL   Basophils Relative 1 0 - 1 %   Basophils Absolute 0.1 0.0 - 0.1 K/uL  Brain natriuretic peptide     Status: None   Collection Time: 12/19/14 11:41 PM  Result Value Ref Range   B Natriuretic Peptide 77.2 0.0 - 100.0 pg/mL    UA not obtained  Lab Results  Component Value Date   HGBA1C 6.1* 10/26/2014    CrCl cannot be calculated (Unknown ideal weight.).  BNP (last 3 results)  Recent Labs  02/10/14 1231 02/16/14 1218 04/10/14 1552  PROBNP 463.0* 448.0* 1873.0*    Other results:  I have pearsonaly reviewed this: ECG REPORT  Rate:92  Rhythm: Sinus rhythm with PACs ST&T Change: Evidence of early repolarization QTC 484   There were no vitals filed for this visit.   Cultures:    Component Value Date/Time   SDES  URINE, CLEAN CATCH 05/28/2011 1030   [place] 05/28/2011 1030   CULT INSIGNIFICANT GROWTH 05/28/2011 1030   REPTSTATUS 05/29/2011 FINAL 05/28/2011 1030     Radiological Exams on Admission: Dg Chest 2 View  12/19/2014   CLINICAL DATA:  Chest pain today.  Hypertension.  Nonsmoker.  EXAM: CHEST  2 VIEW  COMPARISON:  09/19/2014  FINDINGS: Cardiac pacemaker. Normal heart size and pulmonary vascularity. No focal airspace disease or consolidation in the lungs. No blunting of costophrenic  angles. No pneumothorax. Mediastinal contours appear intact.  IMPRESSION: No active cardiopulmonary disease.   Electronically Signed   By: Lucienne Capers M.D.   On: 12/19/2014 23:59    Chart has been reviewed  Family not  at  Bedside    Assessment/Plan  58 yo M w hx of CAD here with chest pain and mildly elevated troponin  Present on Admission:  . Chest pain - even her history of coronary disease somewhat worrisome. We will admit to telemetry cycle cardiac enzymes obtain echogram cardiology consult appreciated, continue Imdur and Plavix  . PSVT (paroxysmal supraventricular tachycardia) continue to monitor on telemetry  . Hypertension continue Imdur currently appears to be well controlled  . Arteriosclerotic cardiovascular disease (ASCVD)- LAD DES 12/03/13 - as per cardiology will admit to telemetry cycle cardiac enzymes obtain lipid panel in the morning obtain echogram  . Subdural hematoma - holding off on anticoagulation continue Plavix as he be unable to tolerate in the past. No neurological abnormalities  . Atrial fibrillation - appears to be in sinus rhythm currently with PACs continue to monitor on telemetry patient not a candidate for anticoagulation given history of subdural hematoma while on Coumadin  . Elevated troponin - mild continued to trend. Patient casts has chronic renal insufficiency creatinine up to 1.3 likely poor clearance of troponin. Patient had an artery catheterization in February that showed no new disease amendable to PCI Prophylaxis: SCD   CODE STATUS:  FULL CODE   as per patient    Disposition:  To home once workup is complete and patient is stable  Other plan as per orders.  I have spent a total of 55 min on this admission  DOUTOVA,ANASTASSIA 12/20/2014, 12:58 AM  [facility]  Pager [phone]   after 2 AM please page floor coverage PA If 7AM-7PM, please contact the day team taking care of the patient  [email or web address]  Password [email or web address]

## 2014-12-20 NOTE — Progress Notes (Signed)
SUBJECTIVE:  Patient seen in consultation earlier this AM.   Chest pain.  Very borderline troponin elevation.  He is also feeling palpitations.   PHYSICAL EXAM Filed Vitals:   12/20/14 0233 12/20/14 0338 12/20/14 0500 12/20/14 0804  BP: 142/68  113/85 109/36  Pulse: 69 70 120 70  Temp: 97.9 F (36.6 C)  98 F (36.7 C) 97.8 F (36.6 C)  TempSrc: Oral   Oral  Resp: 13 16 20    Height: 6' (1.829 m)     Weight: 311 lb (141.069 kg)     SpO2: 98%  100% 99%   General:  No distress Lungs:  Clear Heart:  RRR, apical systolic murmur Abdomen:  Positive bowel sounds, no rebound no guarding Extremities:  No edema   LABS: Lab Results  Component Value Date   TROPONINI 0.04* 12/20/2014   Results for orders placed or performed during the hospital encounter of 12/19/14 (from the past 24 hour(s))  Basic metabolic panel     Status: Abnormal   Collection Time: 12/19/14 11:41 PM  Result Value Ref Range   Sodium 138 135 - 145 mmol/L   Potassium 3.5 3.5 - 5.1 mmol/L   Chloride 104 101 - 111 mmol/L   CO2 29 22 - 32 mmol/L   Glucose, Bld 119 (H) 65 - 99 mg/dL   BUN 11 6 - 20 mg/dL   Creatinine, Ser 1.30 (H) 0.61 - 1.24 mg/dL   Calcium 8.9 8.9 - 10.3 mg/dL   GFR calc non Af Amer 59 (L) >60 mL/min   GFR calc Af Amer >60 >60 mL/min   Anion gap 5 5 - 15  Troponin I     Status: Abnormal   Collection Time: 12/19/14 11:41 PM  Result Value Ref Range   Troponin I 0.05 (H) <0.031 ng/mL  CBC with Differential     Status: None   Collection Time: 12/19/14 11:41 PM  Result Value Ref Range   WBC 8.6 4.0 - 10.5 K/uL   RBC 5.13 4.22 - 5.81 MIL/uL   Hemoglobin 16.3 13.0 - 17.0 g/dL   HCT 47.0 39.0 - 52.0 %   MCV 91.6 78.0 - 100.0 fL   MCH 31.8 26.0 - 34.0 pg   MCHC 34.7 30.0 - 36.0 g/dL   RDW 13.3 11.5 - 15.5 %   Platelets 204 150 - 400 K/uL   Neutrophils Relative % 46 43 - 77 %   Neutro Abs 4.0 1.7 - 7.7 K/uL   Lymphocytes Relative 42 12 - 46 %   Lymphs Abs 3.6 0.7 - 4.0 K/uL   Monocytes  Relative 9 3 - 12 %   Monocytes Absolute 0.8 0.1 - 1.0 K/uL   Eosinophils Relative 2 0 - 5 %   Eosinophils Absolute 0.2 0.0 - 0.7 K/uL   Basophils Relative 1 0 - 1 %   Basophils Absolute 0.1 0.0 - 0.1 K/uL  Brain natriuretic peptide     Status: None   Collection Time: 12/19/14 11:41 PM  Result Value Ref Range   B Natriuretic Peptide 77.2 0.0 - 100.0 pg/mL  Troponin I (q 6hr x 3)     Status: Abnormal   Collection Time: 12/20/14  1:47 AM  Result Value Ref Range   Troponin I 0.05 (H) <0.031 ng/mL  Lipid panel     Status: Abnormal   Collection Time: 12/20/14  4:30 AM  Result Value Ref Range   Cholesterol 127 0 - 200 mg/dL   Triglycerides 84 <150 mg/dL  HDL 33 (L) >40 mg/dL   Total CHOL/HDL Ratio 3.8 RATIO   VLDL 17 0 - 40 mg/dL   LDL Cholesterol 77 0 - 99 mg/dL  TSH     Status: None   Collection Time: 12/20/14  4:30 AM  Result Value Ref Range   TSH 1.648 0.350 - 4.500 uIU/mL  Troponin I (q 6hr x 3)     Status: Abnormal   Collection Time: 12/20/14  7:30 AM  Result Value Ref Range   Troponin I 0.04 (H) <0.031 ng/mL  Magnesium     Status: None   Collection Time: 12/20/14  7:30 AM  Result Value Ref Range   Magnesium 1.7 1.7 - 2.4 mg/dL  Phosphorus     Status: None   Collection Time: 12/20/14  7:30 AM  Result Value Ref Range   Phosphorus 3.7 2.5 - 4.6 mg/dL  Comprehensive metabolic panel     Status: Abnormal   Collection Time: 12/20/14  7:30 AM  Result Value Ref Range   Sodium 138 135 - 145 mmol/L   Potassium 3.3 (L) 3.5 - 5.1 mmol/L   Chloride 106 101 - 111 mmol/L   CO2 26 22 - 32 mmol/L   Glucose, Bld 96 65 - 99 mg/dL   BUN 11 6 - 20 mg/dL   Creatinine, Ser 1.16 0.61 - 1.24 mg/dL   Calcium 8.8 (L) 8.9 - 10.3 mg/dL   Total Protein 7.1 6.5 - 8.1 g/dL   Albumin 3.3 (L) 3.5 - 5.0 g/dL   AST 21 15 - 41 U/L   ALT 15 (L) 17 - 63 U/L   Alkaline Phosphatase 83 38 - 126 U/L   Total Bilirubin 1.0 0.3 - 1.2 mg/dL   GFR calc non Af Amer >60 >60 mL/min   GFR calc Af Amer >60 >60  mL/min   Anion gap 6 5 - 15  CBC     Status: None   Collection Time: 12/20/14  7:30 AM  Result Value Ref Range   WBC 6.6 4.0 - 10.5 K/uL   RBC 4.86 4.22 - 5.81 MIL/uL   Hemoglobin 15.4 13.0 - 17.0 g/dL   HCT 43.6 39.0 - 52.0 %   MCV 89.7 78.0 - 100.0 fL   MCH 31.7 26.0 - 34.0 pg   MCHC 35.3 30.0 - 36.0 g/dL   RDW 13.4 11.5 - 15.5 %   Platelets 179 150 - 400 K/uL   No intake or output data in the 24 hours ending 12/20/14 0924   ASSESSMENT AND PLAN:  CAD:  His enzymes are borderline.  His EKG was unchanged from previous.  He had patent stents on cath earlier this year.  At that time is troponin was more elevated than it is currently.   I do not think further cath or stress testing is indicated.  Continue Imdru.   TACHYCARDIA:   SVT with ventricular tracking.  I will ask the device folks to check his device.  He has not been on any AV nodal blocking agents since before his pacer.  He can start a low dose of beta blocker.   Jeneen Rinks [facility] 12/20/2014 9:24 AM

## 2014-12-20 NOTE — Progress Notes (Signed)
On telemetry review at shift end, 14 bts VT noted at 0307. Nursing in with patient at time completing H&P and administering hydrocodone for c/o arthritic knee pain.  VSS.  K 3.5 on admit.  Dr. Tana Coast notified via text paged and RN report given.

## 2014-12-20 NOTE — ED Notes (Signed)
Claiborne Billings, MD at bedside.

## 2014-12-21 ENCOUNTER — Other Ambulatory Visit: Payer: Self-pay | Admitting: Family Medicine

## 2014-12-21 ENCOUNTER — Inpatient Hospital Stay (HOSPITAL_COMMUNITY): Payer: Medicare Other

## 2014-12-21 DIAGNOSIS — R0789 Other chest pain: Principal | ICD-10-CM

## 2014-12-21 DIAGNOSIS — I679 Cerebrovascular disease, unspecified: Secondary | ICD-10-CM

## 2014-12-21 DIAGNOSIS — E119 Type 2 diabetes mellitus without complications: Secondary | ICD-10-CM

## 2014-12-21 DIAGNOSIS — I422 Other hypertrophic cardiomyopathy: Secondary | ICD-10-CM

## 2014-12-21 DIAGNOSIS — I471 Supraventricular tachycardia: Secondary | ICD-10-CM

## 2014-12-21 DIAGNOSIS — R079 Chest pain, unspecified: Secondary | ICD-10-CM

## 2014-12-21 DIAGNOSIS — E669 Obesity, unspecified: Secondary | ICD-10-CM

## 2014-12-21 LAB — HEMOGLOBIN A1C
Hgb A1c MFr Bld: 6 % — ABNORMAL HIGH (ref 4.8–5.6)
Mean Plasma Glucose: 126 mg/dL

## 2014-12-21 MED ORDER — MAGNESIUM OXIDE 400 (241.3 MG) MG PO TABS: 400.0000 mg | ORAL_TABLET | Freq: Every day | ORAL | Status: DC

## 2014-12-21 MED ORDER — METOPROLOL TARTRATE 25 MG PO TABS: 25.0000 mg | ORAL_TABLET | Freq: Two times a day (BID) | ORAL | Status: DC

## 2014-12-21 NOTE — Telephone Encounter (Signed)
Prescription sent to pharmacy.

## 2014-12-21 NOTE — Progress Notes (Signed)
Subjective:  No complaints overnight  Objective:  Vital Signs in the last 24 hours: Temp:  [97.7 F (36.5 C)-98.7 F (37.1 C)] 97.7 F (36.5 C) (07/18 0740) Pulse Rate:  [63-72] 69 (07/18 0740) Resp:  [16-20] 18 (07/18 0740) BP: (113-120)/(55-75) 115/75 mmHg (07/18 0740) SpO2:  [95 %-100 %] 95 % (07/18 0740) Weight:  [311 lb (141.069 kg)] 311 lb (141.069 kg) (07/18 0500)  Intake/Output from previous day:  Intake/Output Summary (Last 24 hours) at 12/21/14 0839 Last data filed at 12/21/14 0500  Gross per 24 hour  Intake    480 ml  Output      0 ml  Net    480 ml    Physical Exam: Pending- pt getting echo   Rate: 75  Rhythm: normal sinus rhythm and pacing on demand  Lab Results:  Recent Labs  12/19/14 2341 12/20/14 0730  WBC 8.6 6.6  HGB 16.3 15.4  PLT 204 179    Recent Labs  12/19/14 2341 12/20/14 0730  NA 138 138  K 3.5 3.3*  CL 104 106  CO2 29 26  GLUCOSE 119* 96  BUN 11 11  CREATININE 1.30* 1.16    Recent Labs  12/20/14 0730 12/20/14 1415  TROPONINI 0.04* 0.03   No results for input(s): INR in the last 72 hours.  Scheduled Meds: . clopidogrel  75 mg Oral Daily  . furosemide  60 mg Oral Daily  . HYDROcodone-acetaminophen  1 tablet Oral TID  . isosorbide mononitrate  60 mg Oral Daily  . levothyroxine  150 mcg Oral QAC breakfast  . magnesium oxide  400 mg Oral BID  . metoprolol tartrate  25 mg Oral BID  . pantoprazole  40 mg Oral Daily  . potassium chloride SA  20 mEq Oral Daily  . simvastatin  40 mg Oral q1800  . sodium chloride  3 mL Intravenous Q12H  . tamsulosin  0.4 mg Oral Daily   Continuous Infusions:  PRN Meds:.acetaminophen **OR** acetaminophen, colchicine, HYDROcodone-acetaminophen, morphine injection, ondansetron **OR** ondansetron (ZOFRAN) IV   Imaging: Dg Chest 2 View  12/19/2014   CLINICAL DATA:  Chest pain today.  Hypertension.  Nonsmoker.  EXAM: CHEST  2 VIEW  COMPARISON:  09/19/2014  FINDINGS: Cardiac pacemaker.  Normal heart size and pulmonary vascularity. No focal airspace disease or consolidation in the lungs. No blunting of costophrenic angles. No pneumothorax. Mediastinal contours appear intact.  IMPRESSION: No active cardiopulmonary disease.   Electronically Signed   By: Lucienne Capers M.D.   On: 12/19/2014 23:59    Cardiac Studies: Echo pending  Assessment/Plan:  59 yo man with PMH of SVT s/p catheter ablation, PAF and SSS - s/p St Jude pacemaker April 2016, CAD- s/p LAD DES 7/15- patent in Feb 2016, HTN with HCVD and HOCM, and a history of SDH requiring evacuation 04/13/14 leading to stopping aspirin and warfarin and continuing plavix only. He presented 12/19/14 with exertional chest pain and slightly elevated Troponin. EKG shows NSR with ectopy. Beta blocker (stopped pre pacemaker in April) was resumed.   Principal Problem:   Chest pain Active Problems:   CAD- LAD DES 12/03/13, patent Feb 2016   Elevated troponin   Hypertension   Hypertrophic cardiomyopathy   PAF (paroxysmal atrial fibrillation)   Diabetes mellitus type 2 in obese   Pacemaker-St Jude   GERD   PSVT (paroxysmal supraventricular tachycardia)   Cerebrovascular disease   Hyperlipidemia   Obesity (BMI 30-39.9)   S/P subdural hematoma evacuation-Nov 2015  Atrial fibrillation   PLAN: Pacer checked yesterday- will review with MD. Echo being done now. Possible discharge later today if this is OK. He has an apt with Dr Lovena Le 7/25/ for a pacemaker check, this can probably be rescheduled.   [employer] PA-C 12/21/2014, 8:39 AM [phone]

## 2014-12-21 NOTE — Telephone Encounter (Signed)
Okay to refill give 3 

## 2014-12-21 NOTE — Progress Notes (Signed)
  Echocardiogram 2D Echocardiogram has been performed.  Peter Dunlap 12/21/2014, 9:11 AM

## 2014-12-21 NOTE — Telephone Encounter (Signed)
Ok to refill 

## 2014-12-21 NOTE — Discharge Summary (Signed)
Physician Discharge Summary   Patient ID: MCLANE ARORA MRN: [id number] DOB/AGE: [birth year] 59 y.o.  Admit date: 12/19/2014 Discharge date: 12/21/2014  Primary Care Physician:  [name], MD  Discharge Diagnoses:   Atypical chest pain  . PSVT (paroxysmal supraventricular tachycardia) . Hypertension . CAD- LAD DES 12/03/13, patent Feb 2016 . Chest pain . Atrial fibrillation . Elevated troponin . PAF (paroxysmal atrial fibrillation) . GERD . Hyperlipidemia . Obesity (BMI 30-39.9) . Pacemaker-St Jude . Cerebrovascular disease  Consults: Cardiology   Recommendations for Outpatient Follow-up:  Please note patient was started on Lopressor by cardiology  TESTS THAT NEED FOLLOW-UP BMET   DIET: Heart healthy diet    Allergies:   Allergies  Allergen Reactions  . Lactose Intolerance (Gi) Other (See Comments)    UPSET STOMACH      Discharge Medications:   Medication List    STOP taking these medications        traZODone 50 MG tablet  Commonly known as:  DESYREL      TAKE these medications        clopidogrel 75 MG tablet  Commonly known as:  PLAVIX  Take 1 tablet (75 mg total) by mouth daily.     colchicine 0.6 MG tablet  Take 1 tablet (0.6 mg total) by mouth 2 (two) times daily as needed (for gout).     esomeprazole 40 MG capsule  Commonly known as:  NEXIUM  TAKE 1 CAPSULE BY MOUTH EVERY DAY AT 12 NOON     furosemide 20 MG tablet  Commonly known as:  LASIX  Take 3 tablets (60 mg total) by mouth daily.     HYDROcodone-acetaminophen 10-325 MG per tablet  Commonly known as:  NORCO  Take 1 tablet by mouth 3 (three) times daily.     isosorbide mononitrate 60 MG 24 hr tablet  Commonly known as:  IMDUR  Take 1 tablet (60 mg total) by mouth daily.     levothyroxine 150 MCG tablet  Commonly known as:  SYNTHROID, LEVOTHROID  Take 150 mcg by mouth daily before breakfast.     magnesium oxide 400 (241.3 MG) MG tablet  Commonly known as:  MAG-OX   Take 1 tablet (400 mg total) by mouth daily.     metoprolol tartrate 25 MG tablet  Commonly known as:  LOPRESSOR  Take 1 tablet (25 mg total) by mouth 2 (two) times daily.     nitroGLYCERIN 0.4 MG SL tablet  Commonly known as:  NITROSTAT  Place 1 tablet (0.4 mg total) under the tongue every 5 (five) minutes as needed for chest pain.     potassium chloride SA 20 MEQ tablet  Commonly known as:  K-DUR,KLOR-CON  TAKE 1 TABLET BY MOUTH EVERY DAY     RAPAFLO 8 MG Caps capsule  Generic drug:  silodosin  Take 1 capsule (8 mg total) by mouth daily.     simvastatin 40 MG tablet  Commonly known as:  ZOCOR  Take 40 mg by mouth daily.         Brief H and P: For complete details please refer to admission H and P, but in brief Patient is a 59 year old male with hypertension, hyperlipidemia, tobacco abuse, BPH, PSVT, GERD, NSTEMI in 2015, SDH presented to ED with chest pain radiating to the left arm. Patient reported intermittent chest pain in the past 24 hours prior to admission lasting about one hour. Patient reported pain worse with ambulation, associated with lightheadedness. Patient attempted to take nitroglycerin  as well as aspirin it helped somewhat but then recurred. Patient did not endorse nausea vomiting or shortness of breath. Patient presented to emergency department and was found to have elevated troponin up to 0.05. ECG non acute. Cardiology was consulted and patient was admitted for further workup.  Hospital Course:  Chest pain with underlying history of CAD LAD DES 12/03/13, hypertrophic obstructive cardiomyopathy hypertension and atrial fibrillation Patient was admitted for further workup, he was noticed to have elevated troponin, cardiology consult was called. Patient was seen by Dr. Dr. Percival Spanish, EKG was unchanged from previous and patient had patent stents on catheter earlier this year. Per cardiology at this time no further stress testing is indicated. Patient was started on  metoprolol.  Continue aspirin, Plavix, Lasix, Imdur, metoprolol, statin 2-D echocardiogram showed EF of 79-02%, grade 2 diastolic dysfunction, small to moderate pericardial effusion with no cardiac tamponade. Patient may need a repeat echocardiogram, will defer to cardiology.   PSVT (paroxysmal supraventricular tachycardia) Patient was noted to have SVT with ventricular tracking, not on any AV nodal blocking agents, patient was started on Lopressor Replaced potassium and magnesium.   Hypertension - Currently stable, continue Lasix, Imdur, metoprolol   Subdural hematoma - Continue aspirin and Plavix, not on any anticoagulation, currently no neuro deficits   Atrial fibrillation - Currently rate controlled, not on any anticoagulation secondary to subdural hematoma  Hypokalemia, hypomagnesemia Replaced  hypothyroidism Continue Synthroid   Day of Discharge BP 115/75 mmHg  Pulse 69  Temp(Src) 97.7 F (36.5 C) (Oral)  Resp 18  Ht 6' (1.829 m)  Wt 141.069 kg (311 lb)  BMI 42.17 kg/m2  SpO2 95%  Physical Exam: General: Alert and awake oriented x3 not in any acute distress. HEENT: anicteric sclera, pupils reactive to light and accommodation CVS: S1-S2 clear, apical systolic murmur Chest: clear to auscultation bilaterally, no wheezing rales or rhonchi Abdomen: soft nontender, nondistended, normal bowel sounds Extremities: no cyanosis, clubbing or edema noted bilaterally Neuro: Cranial nerves II-XII intact, no focal neurological deficits   The results of significant diagnostics from this hospitalization (including imaging, microbiology, ancillary and laboratory) are listed below for reference.    LAB RESULTS: Basic Metabolic Panel:  Recent Labs Lab 12/19/14 2341 12/20/14 0730  NA 138 138  K 3.5 3.3*  CL 104 106  CO2 29 26  GLUCOSE 119* 96  BUN 11 11  CREATININE 1.30* 1.16  CALCIUM 8.9 8.8*  MG  --  1.7  PHOS  --  3.7   Liver Function Tests:  Recent  Labs Lab 12/20/14 0730  AST 21  ALT 15*  ALKPHOS 83  BILITOT 1.0  PROT 7.1  ALBUMIN 3.3*   No results for input(s): LIPASE, AMYLASE in the last 168 hours. No results for input(s): AMMONIA in the last 168 hours. CBC:  Recent Labs Lab 12/19/14 2341 12/20/14 0730  WBC 8.6 6.6  NEUTROABS 4.0  --   HGB 16.3 15.4  HCT 47.0 43.6  MCV 91.6 89.7  PLT 204 179   Cardiac Enzymes:  Recent Labs Lab 12/20/14 0730 12/20/14 1415  TROPONINI 0.04* 0.03   BNP: Invalid input(s): POCBNP CBG: No results for input(s): GLUCAP in the last 168 hours.  Significant Diagnostic Studies:  Dg Chest 2 View  12/19/2014   CLINICAL DATA:  Chest pain today.  Hypertension.  Nonsmoker.  EXAM: CHEST  2 VIEW  COMPARISON:  09/19/2014  FINDINGS: Cardiac pacemaker. Normal heart size and pulmonary vascularity. No focal airspace disease or consolidation in the lungs.  No blunting of costophrenic angles. No pneumothorax. Mediastinal contours appear intact.  IMPRESSION: No active cardiopulmonary disease.   Electronically Signed   By: Lucienne Capers M.D.   On: 12/19/2014 23:59    2D ECHO: Study Conclusions  - Left ventricle: The cavity size was normal. There was severe concentric hypertrophy. Systolic function was normal. The estimated ejection fraction was in the range of 60% to 65%. Wall motion was normal; there were no regional wall motion abnormalities. Features are consistent with a pseudonormal left ventricular filling pattern, with concomitant abnormal relaxation and increased filling pressure (grade 2 diastolic dysfunction). Doppler parameters are consistent with high ventricular filling pressure. - Aortic valve: Severe diffuse thickening and calcification, consistent with sclerosis. - Aorta: Aortic root dimension: 39 mm (ED). - Aortic root: The aortic root was mildly dilated. - Mitral valve: Calcified annulus. - Pericardium, extracardiac: Epigastric views were not  adequately visualized to comment on RA or RV collapse. A small to moderate, free-flowing pericardial effusion was identified posterior to the heart and circumferential to the heart. The fluid had no internal echoes.   Disposition and Follow-up: Discharge Instructions    Diet - low sodium heart healthy    Complete by:  As directed      Increase activity slowly    Complete by:  As directed             DISPOSITION: home    DISCHARGE FOLLOW-UP Follow-up Information    Follow up with [name], MD. Go on 01/04/2015.   Specialty:  Family Medicine   Why:  for hospital follow-up @ 10:15am    Contact information:   [address] [place] [address] [phone]       Follow up with [name], MD On 12/28/2014.   Specialty:  Cardiology   Why:  at 8:45 am   Contact information:   [place] [address] [phone]        Time spent on Discharge: 35 mins   Signed:   RAI,RIPUDEEP M.D. [facility] 12/21/2014, 9:57 AM Pager: [phone]

## 2014-12-25 DIAGNOSIS — G4733 Obstructive sleep apnea (adult) (pediatric): Secondary | ICD-10-CM | POA: Diagnosis not present

## 2014-12-28 ENCOUNTER — Encounter: Payer: Self-pay | Admitting: Internal Medicine

## 2014-12-28 ENCOUNTER — Ambulatory Visit (INDEPENDENT_AMBULATORY_CARE_PROVIDER_SITE_OTHER): Payer: Medicare Other | Admitting: Internal Medicine

## 2014-12-28 VITALS — BP 110/60 | HR 86 | Ht 72.0 in | Wt 311.8 lb

## 2014-12-28 DIAGNOSIS — I5032 Chronic diastolic (congestive) heart failure: Secondary | ICD-10-CM | POA: Diagnosis not present

## 2014-12-28 DIAGNOSIS — Z950 Presence of cardiac pacemaker: Secondary | ICD-10-CM

## 2014-12-28 DIAGNOSIS — I495 Sick sinus syndrome: Secondary | ICD-10-CM

## 2014-12-28 DIAGNOSIS — I480 Paroxysmal atrial fibrillation: Secondary | ICD-10-CM

## 2014-12-28 LAB — CUP PACEART INCLINIC DEVICE CHECK
Battery Remaining Longevity: 129.6 mo
Battery Voltage: 3.02 V
Brady Statistic RA Percent Paced: 3.9 %
Brady Statistic RV Percent Paced: 12 %
Date Time Interrogation Session: 20160725085840
Lead Channel Impedance Value: 450 Ohm
Lead Channel Impedance Value: 625 Ohm
Lead Channel Pacing Threshold Amplitude: 0.5 V
Lead Channel Pacing Threshold Amplitude: 0.75 V
Lead Channel Pacing Threshold Pulse Width: 0.5 ms
Lead Channel Pacing Threshold Pulse Width: 0.5 ms
Lead Channel Sensing Intrinsic Amplitude: 12 mV
Lead Channel Sensing Intrinsic Amplitude: 5 mV
Lead Channel Setting Pacing Amplitude: 2 V
Lead Channel Setting Pacing Amplitude: 2.5 V
Lead Channel Setting Pacing Pulse Width: 0.5 ms
Lead Channel Setting Sensing Sensitivity: 2 mV
Pulse Gen Model: 2240
Pulse Gen Serial Number: 7756161

## 2014-12-28 NOTE — Patient Instructions (Signed)
Medication Instructions:  Your physician recommends that you continue on your current medications as directed. Please refer to the Current Medication list given to you today.   Labwork: NONE  Testing/Procedures: NONE  Follow-Up: Your physician wants you to follow-up in: 9 months with Dr. Lovena Le. You will receive a reminder letter in the mail two months in advance. If you don't receive a letter, please call our office to schedule the follow-up appointment.  Remote monitoring is used to monitor your Pacemaker or ICD from home. This monitoring reduces the number of office visits required to check your device to one time per year. It allows [place] to keep an eye on the functioning of your device to ensure it is working properly. You are scheduled for a device check from home on 03/30/2015. You may send your transmission at any time that day. If you have a wireless device, the transmission will be sent automatically. After your physician reviews your transmission, you will receive a postcard with your next transmission date.    Any Other Special Instructions Will Be Listed Below (If Applicable).

## 2014-12-28 NOTE — Assessment & Plan Note (Signed)
He is maintaining NSR 93 % of the time. He will continue his beta blocker. Hold off on anti-arrhythmic meds for now. He will take an additional metoprolol for recurrent palpitations.

## 2014-12-28 NOTE — Assessment & Plan Note (Signed)
His St. Jude DDD PM is working normally. Will recheck in several months. 

## 2014-12-28 NOTE — Progress Notes (Signed)
HPI Peter Dunlap returns today for followup. He is a pleasant middle aged man with a h/o tobacco abuse, SVT, s/p catheter ablation, HTN and atrial fibrillation and CAD. He underwent insertion of a non-drug eluting stent to the LAD when he presented with chest pain and was found to have an 80% LAD stenosis. His dyspnea is much improved.He denies chest pain, syncope but also had mild peripheral edema. He has worn a cardiac monitor because of dizzy spells and found to have pauses of over 3 seconds which are symptomatic. He was on no AV nodal blocking drugs although he needed them for his rapid atrial fib. He is not a candidate for systemic anti-coagulation because of subdural hematoma.  He has tolerated plavix. He underwent PPM several months ago. In the interim, he has been in the hospital once with palpitations. No dizzy spells since his PPM was placed. Allergies  Allergen Reactions  . Lactose Intolerance (Gi) Other (See Comments)    UPSET STOMACH      Current Outpatient Prescriptions  Medication Sig Dispense Refill  . clopidogrel (PLAVIX) 75 MG tablet Take 1 tablet (75 mg total) by mouth daily. 30 tablet 5  . colchicine 0.6 MG tablet Take 1 tablet (0.6 mg total) by mouth 2 (two) times daily as needed (for gout). 14 tablet 1  . esomeprazole (NEXIUM) 40 MG capsule TAKE 1 CAPSULE BY MOUTH EVERY DAY AT 12 NOON 30 capsule 3  . furosemide (LASIX) 20 MG tablet Take 3 tablets (60 mg total) by mouth daily. 90 tablet 3  . HYDROcodone-acetaminophen (NORCO) 10-325 MG per tablet Take 1 tablet by mouth 3 (three) times daily.  0  . isosorbide mononitrate (IMDUR) 60 MG 24 hr tablet Take 1 tablet (60 mg total) by mouth daily. 90 tablet 3  . levothyroxine (SYNTHROID, LEVOTHROID) 150 MCG tablet Take 150 mcg by mouth daily before breakfast.    . magnesium oxide (MAG-OX) 400 (241.3 MG) MG tablet Take 1 tablet (400 mg total) by mouth daily. 7 tablet 0  . metoprolol tartrate (LOPRESSOR) 25 MG tablet Take 1  tablet (25 mg total) by mouth 2 (two) times daily. 60 tablet 3  . nitroGLYCERIN (NITROSTAT) 0.4 MG SL tablet Place 1 tablet (0.4 mg total) under the tongue every 5 (five) minutes as needed for chest pain. 25 tablet 12  . potassium chloride SA (K-DUR,KLOR-CON) 20 MEQ tablet TAKE 1 TABLET BY MOUTH EVERY DAY 30 tablet 6  . RAPAFLO 8 MG CAPS capsule Take 1 capsule (8 mg total) by mouth daily. 30 capsule 5  . simvastatin (ZOCOR) 40 MG tablet Take 40 mg by mouth daily.     . traZODone (DESYREL) 50 MG tablet TAKE 1/2 TO 1 TABLET BY MOUTH AT BEDTIME AS NEEDED FOR SLEEP 30 tablet 3   No current facility-administered medications for this visit.   Facility-Administered Medications Ordered in Other Visits  Medication Dose Route Frequency Provider Last Rate Last Dose  . 0.9 %  sodium chloride infusion   Intravenous Continuous [name], MD         Past Medical History  Diagnosis Date  . Hypertension     Severe LVH with normal EF  . Arteriosclerotic cardiovascular disease (ASCVD) 2005    catheterization in 10/2010:50% mid LAD, diffuse distal disease, circumflex irregularities, large dominant RCA with a 50% ostial, 70% distal, 60% posterolateral and 70% PDA; normal EF  . Cerebrovascular disease 2010    R. carotid endarterectomy; Duplex in 10/2010-widely patent  ICAs, subtotal left vertebral-not thought to be contributing to symptoms  . Hyperlipidemia   . Obesity   . Tobacco abuse     Quit 2014  . Benign prostatic hypertrophy   . Low back pain   . History of PSVT (paroxysmal supraventricular tachycardia) 2005    Diagnosed with ILR; no recurrence following RFA  . Cervical spine disease     CT in 2012-advanced degeneration and spondylosis with moderate spinal stenosis at C3-C6  . H/O: substance abuse     Cocaine, marijuana, alcohol.  Quit 2013.   [name] Syncope   . Gastroesophageal reflux disease   . Depression   . Erectile dysfunction   . CHF (congestive heart failure)   . H/O hiatal hernia   .  Headache(784.0)   . Arthritis   . Pericardial effusion without cardiac tamponade 12/03/2013  . Non-ST elevation myocardial infarction (NSTEMI), initial episode of care 12/02/2013    DES LAD  . Thyroid disease   . Sleep apnea     CPAP  . Tachy-brady syndrome   . Presence of permanent cardiac pacemaker 09/18/2014  . Carotid artery occlusion     left    ROS:   All systems reviewed and negative except as noted in the HPI.   Past Surgical History  Procedure Laterality Date  . Radiofrequency ablation  2005    for PSVT  . Coronary angioplasty with stent placement  12/03/2013    LAD 90%-->0% W/ Promus Premier DES 3.0 mm x 16 mm, CFX OK, RCA 40%, EF 70-75%  . Carotid endarterectomy Right Feb. 25, 2010     CEA  . Trudee Kuster hole Right 04/13/2014    Procedure: Haskell Flirt;  Surgeon: Charlie Pitter, MD;  Location: [facility];  Service: Neurosurgery;  Laterality: Right;  . Left heart catheterization with coronary angiogram Left 12/03/2013    Procedure: LEFT HEART CATHETERIZATION WITH CORONARY ANGIOGRAM;  Surgeon: Leonie Man, MD;  Location: [facility] CATH LAB;  Service: Cardiovascular;  Laterality: Left;  . Percutaneous coronary stent intervention (pci-s)  12/03/2013    Procedure: PERCUTANEOUS CORONARY STENT INTERVENTION (PCI-S);  Surgeon: Leonie Man, MD;  Location[facility] C[facility] LAB;  Service: Cardiovascular;;  . Left heart catheterization with coronary angiogram N/A 01/26/2014    Procedure: LEFT HEART CATHETERIZATION WITH CORONARY ANGIOGRAM;  Surgeon: Jettie Booze, MD;  Location: [facility] CATH LAB;  Service: Cardiovascular;  Laterality: N/A;  . Left heart catheterization with coronary angiogram N/A 08/03/2014    Procedure: LEFT HEART CATHETERIZATION WITH CORONARY ANGIOGRAM;  Surgeon: Burnell Blanks, MD;  Location: [facility] CATH LAB;  Service: Cardiovascular;  Laterality: N/A;  . Permanent pacemaker insertion N/A 09/18/2014    Procedure: PERMANENT PACEMAKER INSERTION;  Surgeon: [name], MD;  Location:  [facility] CATH LAB;  Service: Cardiovascular;  Laterality: N/A;  . Brain surgery  2015    hematoma evacuation     Family History  Problem Relation Age of Onset  . Hypertension Mother     Cerebrovascular disease  . Diabetes Mother   . Coronary artery disease Father 75  . Diabetes type II Father   . Hypertension Father   . Heart attack Father   . Lung cancer Paternal Uncle   . Diabetes Sister   . Hypertension Sister   . Heart attack Sister 41  . Diabetes Brother   . Hypertension Brother      History   Social History  . Marital Status: Married    Spouse Name: N/A  .  Number of Children: 0  . Years of Education: N/A   Occupational History  . Retired    Social History Main Topics  . Smoking status: Former Smoker -- 1.00 packs/day for 40 years    Types: Cigarettes    Start date: 10/20/1972    Quit date: 10/10/2012  . Smokeless tobacco: Never Used     Comment: Quit in May.   . Alcohol Use: No     Comment: former drinker-- sober since 2013.   . Drug Use: No     Comment: quit cocaine 10/2011  . Sexual Activity:    Partners: Female   Other Topics Concern  . Not on file   Social History Narrative   Lives in [place].     BP 110/60 mmHg  Pulse 86  Ht 6' (1.829 m)  Wt 311 lb 12.8 oz (141.432 kg)  BMI 42.28 kg/m2  SpO2 98%  Physical Exam:  stable appearing middle aged man, looks older than stated age, NAD HEENT: Unremarkable Neck:  7 cm JVD, no thyromegally Back:  No CVA tenderness Lungs:  Clear except for basilar rales HEART:  Regular rate rhythm, no murmurs, no rubs, no clicks Abd:  soft, positive bowel sounds, no organomegally, no rebound, no guarding Ext:  2 plus pulses, no edema, no cyanosis, no clubbing Skin:  No rashes no nodules Neuro:  CN II through XII intact, motor grossly intact    Assess/Plan:

## 2014-12-28 NOTE — Assessment & Plan Note (Signed)
He is euvolemic today. He will continue his current meds. He is encouraged to maintain a low sodium diet.

## 2014-12-29 ENCOUNTER — Encounter: Payer: Medicare Other | Admitting: Internal Medicine

## 2014-12-30 ENCOUNTER — Other Ambulatory Visit: Payer: Self-pay | Admitting: Internal Medicine

## 2014-12-30 ENCOUNTER — Telehealth: Payer: Self-pay

## 2014-12-30 NOTE — Telephone Encounter (Signed)
Pt was in office on 12/28/2014 to see Dr. Lovena Le.  Pt stated during visit he is having surgery in the next few months, he thought MD's name was Dr. Tacy Dura.  Per Dr. Lovena Le, pt is "low risk and is cleared for surgery".

## 2015-01-04 ENCOUNTER — Inpatient Hospital Stay: Payer: Medicare Other | Admitting: Family Medicine

## 2015-01-06 DIAGNOSIS — E032 Hypothyroidism due to medicaments and other exogenous substances: Secondary | ICD-10-CM | POA: Diagnosis not present

## 2015-01-08 ENCOUNTER — Encounter: Payer: Self-pay | Admitting: Family Medicine

## 2015-01-08 ENCOUNTER — Ambulatory Visit (INDEPENDENT_AMBULATORY_CARE_PROVIDER_SITE_OTHER): Payer: Medicare Other | Admitting: Family Medicine

## 2015-01-08 VITALS — BP 120/70 | HR 68 | Temp 97.5°F | Resp 16 | Ht 72.0 in | Wt 312.0 lb

## 2015-01-08 DIAGNOSIS — E785 Hyperlipidemia, unspecified: Secondary | ICD-10-CM

## 2015-01-08 DIAGNOSIS — I10 Essential (primary) hypertension: Secondary | ICD-10-CM

## 2015-01-08 DIAGNOSIS — E669 Obesity, unspecified: Secondary | ICD-10-CM

## 2015-01-08 DIAGNOSIS — E119 Type 2 diabetes mellitus without complications: Secondary | ICD-10-CM | POA: Diagnosis not present

## 2015-01-08 DIAGNOSIS — I480 Paroxysmal atrial fibrillation: Secondary | ICD-10-CM | POA: Diagnosis not present

## 2015-01-08 DIAGNOSIS — G4733 Obstructive sleep apnea (adult) (pediatric): Secondary | ICD-10-CM | POA: Diagnosis not present

## 2015-01-08 DIAGNOSIS — E1169 Type 2 diabetes mellitus with other specified complication: Secondary | ICD-10-CM

## 2015-01-08 NOTE — Assessment & Plan Note (Signed)
Cholesterol is at goal continue simvastatin

## 2015-01-08 NOTE — Assessment & Plan Note (Signed)
Paroxysmal atrial fibrillation he is currently in A. fib but rate controlled. Continue the Lopressor his blood thinners Plavix

## 2015-01-08 NOTE — Progress Notes (Signed)
Patient ID: Peter Dunlap, male   DOB: Oct 17, 1955, 59 y.o.   MRN: [id number]   Subjective:    Patient ID: Peter Dunlap, male    DOB: [birth year], 59 y.o.   MRN: [id number]  Patient presents for Hospital F/U  patient for hospital follow-up. He was hospitalized secondary to paroxysmal atrial fibrillation. He was started on Lopressor and he has done well with this. He was seen by his cardiology and also have his device interrogated and this was normal. He has no new concerns today he has not had any chest pain or shortness of breath. Occasionally he will feel when his heart is out of rhythm. He has not had to use any nitroglycerin or any extra Lopressor. We reviewed his labs from the hospital his A1c shows good control of his diabetes mellitus his cholesterol is also at goal his kidney function was back to goal. He had thyroid labs done last week for his endocrinologist he has a follow-up appointment next week.  He is planning to have knee surgery and has been cleared by cardiology    Review Of Systems:  GEN- denies fatigue, fever, weight loss,weakness, recent illness HEENT- denies eye drainage, change in vision, nasal discharge, CVS- denies chest pain, palpitations RESP- denies SOB, cough, wheeze ABD- denies N/V, change in stools, abd pain GU- denies dysuria, hematuria, dribbling, incontinence MSK- + joint pain, muscle aches, injury Neuro- denies headache, dizziness, syncope, seizure activity       Objective:    BP 120/70 mmHg  Pulse 68  Temp(Src) 97.5 F (36.4 C) (Oral)  Resp 16  Ht 6' (1.829 m)  Wt 312 lb (141.522 kg)  BMI 42.31 kg/m2 GEN- NAD, alert and oriented x3, obese HEENT- PERRL, EOMI, non injected sclera, pink conjunctiva, MMM, oropharynx clear Neck- Supple,  CVS- irregular rhythm, normal rate, no murmur RESP-CTAB EXT- No edema  Pulses- Radial, DP  2+       Assessment & Plan:      Problem List Items Addressed This Visit    OSA (obstructive sleep apnea)  - Primary   Obesity (BMI 30-39.9) (Chronic)   Diabetes mellitus type 2 in obese (Chronic)   Atrial fibrillation      Note: This dictation was prepared with Dragon dictation along with smaller phrase technology. Any transcriptional errors that result from this process are unintentional.

## 2015-01-08 NOTE — Assessment & Plan Note (Signed)
He continues to benefit from the use of the C Pap will continue

## 2015-01-08 NOTE — Patient Instructions (Addendum)
Continue current medications Change f/u to 4 months

## 2015-01-08 NOTE — Assessment & Plan Note (Signed)
Blood pressure is well controlled 

## 2015-01-08 NOTE — Assessment & Plan Note (Signed)
Diet-controlled A1c at 6%

## 2015-01-11 ENCOUNTER — Telehealth: Payer: Self-pay | Admitting: Internal Medicine

## 2015-01-11 NOTE — Telephone Encounter (Signed)
New message      Pt is calling to see if Dr Lovena Le has completed form from home health nurse and faxed it back to them.  Please call

## 2015-01-11 NOTE — Telephone Encounter (Signed)
The form is for a [facility] aide to help him as he has bad knees.  I have asked that he obtain that form from Dr Eulas Post who follows him for that  He verbalized unserstanding and will call them.  I let him know if I received form I would forward to them

## 2015-01-13 DIAGNOSIS — E6609 Other obesity due to excess calories: Secondary | ICD-10-CM | POA: Diagnosis not present

## 2015-01-13 DIAGNOSIS — E032 Hypothyroidism due to medicaments and other exogenous substances: Secondary | ICD-10-CM | POA: Diagnosis not present

## 2015-01-13 DIAGNOSIS — E782 Mixed hyperlipidemia: Secondary | ICD-10-CM | POA: Diagnosis not present

## 2015-01-18 DIAGNOSIS — G4733 Obstructive sleep apnea (adult) (pediatric): Secondary | ICD-10-CM | POA: Diagnosis not present

## 2015-01-23 ENCOUNTER — Other Ambulatory Visit: Payer: Self-pay | Admitting: Physician Assistant

## 2015-01-25 DIAGNOSIS — G4733 Obstructive sleep apnea (adult) (pediatric): Secondary | ICD-10-CM | POA: Diagnosis not present

## 2015-02-10 ENCOUNTER — Other Ambulatory Visit: Payer: Self-pay | Admitting: Nurse Practitioner

## 2015-02-10 DIAGNOSIS — M179 Osteoarthritis of knee, unspecified: Secondary | ICD-10-CM | POA: Diagnosis not present

## 2015-03-01 ENCOUNTER — Ambulatory Visit: Payer: Medicare Other | Admitting: Family Medicine

## 2015-03-09 ENCOUNTER — Ambulatory Visit (INDEPENDENT_AMBULATORY_CARE_PROVIDER_SITE_OTHER): Payer: Medicare Other | Admitting: *Deleted

## 2015-03-09 DIAGNOSIS — Z23 Encounter for immunization: Secondary | ICD-10-CM

## 2015-03-12 ENCOUNTER — Encounter (HOSPITAL_COMMUNITY): Payer: Self-pay | Admitting: Emergency Medicine

## 2015-03-12 ENCOUNTER — Telehealth: Payer: Self-pay | Admitting: *Deleted

## 2015-03-12 ENCOUNTER — Emergency Department (HOSPITAL_COMMUNITY)
Admission: EM | Admit: 2015-03-12 | Discharge: 2015-03-12 | Disposition: A | Discharge status: Home / Self Care | Payer: Medicare Other | Attending: Emergency Medicine | Admitting: Emergency Medicine

## 2015-03-12 ENCOUNTER — Emergency Department (HOSPITAL_COMMUNITY): Payer: Medicare Other

## 2015-03-12 DIAGNOSIS — I10 Essential (primary) hypertension: Secondary | ICD-10-CM | POA: Insufficient documentation

## 2015-03-12 DIAGNOSIS — G4730 Sleep apnea, unspecified: Secondary | ICD-10-CM | POA: Diagnosis not present

## 2015-03-12 DIAGNOSIS — Z7902 Long term (current) use of antithrombotics/antiplatelets: Secondary | ICD-10-CM | POA: Diagnosis not present

## 2015-03-12 DIAGNOSIS — E079 Disorder of thyroid, unspecified: Secondary | ICD-10-CM | POA: Diagnosis not present

## 2015-03-12 DIAGNOSIS — K219 Gastro-esophageal reflux disease without esophagitis: Secondary | ICD-10-CM | POA: Diagnosis not present

## 2015-03-12 DIAGNOSIS — I509 Heart failure, unspecified: Secondary | ICD-10-CM | POA: Insufficient documentation

## 2015-03-12 DIAGNOSIS — I252 Old myocardial infarction: Secondary | ICD-10-CM | POA: Diagnosis not present

## 2015-03-12 DIAGNOSIS — E785 Hyperlipidemia, unspecified: Secondary | ICD-10-CM | POA: Diagnosis not present

## 2015-03-12 DIAGNOSIS — Z9981 Dependence on supplemental oxygen: Secondary | ICD-10-CM | POA: Insufficient documentation

## 2015-03-12 DIAGNOSIS — M7750 Other enthesopathy of unspecified foot: Secondary | ICD-10-CM

## 2015-03-12 DIAGNOSIS — M779 Enthesopathy, unspecified: Secondary | ICD-10-CM | POA: Diagnosis not present

## 2015-03-12 DIAGNOSIS — M25572 Pain in left ankle and joints of left foot: Secondary | ICD-10-CM | POA: Diagnosis not present

## 2015-03-12 DIAGNOSIS — M65272 Calcific tendinitis, left ankle and foot: Secondary | ICD-10-CM | POA: Diagnosis not present

## 2015-03-12 DIAGNOSIS — M1990 Unspecified osteoarthritis, unspecified site: Secondary | ICD-10-CM | POA: Diagnosis not present

## 2015-03-12 DIAGNOSIS — Z87891 Personal history of nicotine dependence: Secondary | ICD-10-CM | POA: Insufficient documentation

## 2015-03-12 DIAGNOSIS — F329 Major depressive disorder, single episode, unspecified: Secondary | ICD-10-CM | POA: Insufficient documentation

## 2015-03-12 DIAGNOSIS — Z79899 Other long term (current) drug therapy: Secondary | ICD-10-CM | POA: Insufficient documentation

## 2015-03-12 DIAGNOSIS — E669 Obesity, unspecified: Secondary | ICD-10-CM | POA: Insufficient documentation

## 2015-03-12 MED ORDER — HYDROMORPHONE HCL 1 MG/ML IJ SOLN
2.0000 mg | Freq: Once | INTRAMUSCULAR | Status: AC
  Administered 2015-03-12: 2 mg via INTRAMUSCULAR
  Filled 2015-03-12: qty 2

## 2015-03-12 MED ORDER — IBUPROFEN 600 MG PO TABS: 600.0000 mg | ORAL_TABLET | Freq: Four times a day (QID) | ORAL | Status: DC | PRN

## 2015-03-12 NOTE — ED Notes (Signed)
Pt reports left ankle pain and inner foot pain. Pt reports taking 10 of vicodin x3 in the last 24 hrs with no relief. +1 swelling for 2+ pedal pulse. Pain continues to work its way up his foot.

## 2015-03-12 NOTE — ED Provider Notes (Signed)
CSN: [id number]     Arrival date & time 03/12/15  0404 History   First MD Initiated Contact with Patient 03/12/15 [phone]     Chief Complaint  Patient presents with  . Ankle Pain     (Consider location/radiation/quality/duration/timing/severity/associated sxs/prior Treatment) Patient is a 59 y.o. male presenting with ankle pain. The history is provided by the patient.  Ankle Pain Location:  Foot and ankle Time since incident:  3 days Injury: no   Ankle location:  L ankle Foot location:  L foot Pain details:    Quality:  Dull and sharp   Radiates to:  Does not radiate   Severity:  Severe   Onset quality:  Gradual   Duration:  3 days   Timing:  Constant Chronicity:  New Prior injury to area:  No Ineffective treatments:  Ice, heat, elevation and NSAIDs Associated symptoms: no numbness, no swelling and no tingling   Risk factors: no concern for non-accidental trauma and no known bone disorder     Past Medical History  Diagnosis Date  . Hypertension     Severe LVH with normal EF  . Arteriosclerotic cardiovascular disease (ASCVD) 2005    catheterization in 10/2010:50% mid LAD, diffuse distal disease, circumflex irregularities, large dominant RCA with a 50% ostial, 70% distal, 60% posterolateral and 70% PDA; normal EF  . Cerebrovascular disease 2010    R. carotid endarterectomy; Duplex in 10/2010-widely patent ICAs, subtotal left vertebral-not thought to be contributing to symptoms  . Hyperlipidemia   . Obesity   . Tobacco abuse     Quit 2014  . Benign prostatic hypertrophy   . Low back pain   . History of PSVT (paroxysmal supraventricular tachycardia) 2005    Diagnosed with ILR; no recurrence following RFA  . Cervical spine disease     CT in 2012-advanced degeneration and spondylosis with moderate spinal stenosis at C3-C6  . H/O: substance abuse     Cocaine, marijuana, alcohol.  Quit 2013.   [name] Syncope   . Gastroesophageal reflux disease   . Depression   . Erectile  dysfunction   . CHF (congestive heart failure) ([place])   . H/O hiatal hernia   . Headache(784.0)   . Arthritis   . Pericardial effusion without cardiac tamponade 12/03/2013  . Non-ST elevation myocardial infarction (NSTEMI), initial episode of care [facility]) 12/02/2013    DES LAD  . Thyroid disease   . Sleep apnea     CPAP  . Tachy-brady syndrome ([place])   . Presence of permanent cardiac pacemaker 09/18/2014  . Carotid artery occlusion     left   Past Surgical History  Procedure Laterality Date  . Radiofrequency ablation  2005    for PSVT  . Coronary angioplasty with stent placement  12/03/2013    LAD 90%-->0% W/ Promus Premier DES 3.0 mm x 16 mm, CFX OK, RCA 40%, EF 70-75%  . Carotid endarterectomy Right Feb. 25, 2010     CEA  . Trudee Kuster hole Right 04/13/2014    Procedure: Haskell Flirt;  Surgeon: Charlie Pitter, MD;  Location: [facility] NEURO ORS;  Service: Neurosurgery;  Laterality: Right;  . Left heart catheterization with coronary angiogram Left 12/03/2013    Procedure: LEFT HEART CATHETERIZATION WITH CORONARY ANGIOGRAM;  Surgeon: Leonie Man, MD;  Location: [facility] CATH LAB;  Service: Cardiovascular;  Laterality: Left;  . Percutaneous coronary stent intervention (pci-s)  12/03/2013    Procedure: PERCUTANEOUS CORONARY STENT INTERVENTION (PCI-S);  Surgeon: Leonie Man, MD;  Location:  [facility] CATH LAB;  Service: Cardiovascular;;  . Left heart catheterization with coronary angiogram N/A 01/26/2014    Procedure: LEFT HEART CATHETERIZATION WITH CORONARY ANGIOGRAM;  Surgeon: Jettie Booze, MD;  Location: [facility] CATH LAB;  Service: Cardiovascular;  Laterality: N/A;  . Left heart catheterization with coronary angiogram N/A 08/03/2014    Procedure: LEFT HEART CATHETERIZATION WITH CORONARY ANGIOGRAM;  Surgeon: Burnell Blanks, MD;  Location: [facility] CATH LAB;  Service: Cardiovascular;  Laterality: N/A;  . Permanent pacemaker insertion N/A 09/18/2014    Procedure: PERMANENT PACEMAKER INSERTION;  Surgeon: [name],  MD;  Location: [facility] CATH LAB;  Service: Cardiovascular;  Laterality: N/A;  . Brain surgery  2015    hematoma evacuation   Family History  Problem Relation Age of Onset  . Hypertension Mother     Cerebrovascular disease  . Diabetes Mother   . Coronary artery disease Father 59  . Diabetes type II Father   . Hypertension Father   . Heart attack Father   . Lung cancer Paternal Uncle   . Diabetes Sister   . Hypertension Sister   . Heart attack Sister 29  . Diabetes Brother   . Hypertension Brother    Social History  Substance Use Topics  . Smoking status: Former Smoker -- 1.00 packs/day for 40 years    Types: Cigarettes    Start date: 10/20/1972    Quit date: 10/10/2012  . Smokeless tobacco: Never Used     Comment: Quit in May.   . Alcohol Use: No     Comment: former drinker-- sober since 2013.     Review of Systems  Musculoskeletal: Positive for arthralgias and gait problem.  Skin: Negative for wound.  Allergic/Immunologic: Negative for immunocompromised state.  Hematological: Does not bruise/bleed easily.      Allergies  Lactose intolerance (gi)  Home Medications   Prior to Admission medications   Medication Sig Start Date End Date Taking? Authorizing Provider  clopidogrel (PLAVIX) 75 MG tablet Take 1 tablet (75 mg total) by mouth daily. 10/26/14  Yes [name], MD  colchicine 0.6 MG tablet Take 1 tablet (0.6 mg total) by mouth 2 (two) times daily as needed (for gout). 09/19/14  Yes Isaiah Serge, NP  esomeprazole (NEXIUM) 40 MG capsule TAKE 1 CAPSULE BY MOUTH EVERY DAY AT 12 NOON 10/26/14  Yes [name], MD  furosemide (LASIX) 20 MG tablet TAKE 3 TABLETS BY MOUTH DAILY 12/30/14  Yes [name], MD  HYDROcodone-acetaminophen [facility]) 10-325 MG per tablet Take 1 tablet by mouth 3 (three) times daily. 08/26/14  Yes Historical Provider, MD  isosorbide mononitrate (IMDUR) 60 MG 24 hr tablet TAKE 1 TABLET BY MOUTH EVERY DAY 02/10/15  Yes Burtis Junes, NP   levothyroxine (SYNTHROID, LEVOTHROID) 150 MCG tablet Take 150 mcg by mouth daily before breakfast.   Yes Historical Provider, MD  metoprolol tartrate (LOPRESSOR) 25 MG tablet Take 1 tablet (25 mg total) by mouth 2 (two) times daily. 12/21/14  Yes Ripudeep K Rai, MD  NITROSTAT 0.4 MG SL tablet PLACE 1 TABLET UNDER THE TONGUE EVERY 5 MINUTES AS NEEDED FOR CHEST PAIN 01/25/15  Yes Rhonda G Barrett, PA-C  potassium chloride SA (K-DUR,KLOR-CON) 20 MEQ tablet TAKE 1 TABLET BY MOUTH EVERY DAY 10/05/14  Yes [name], MD  RAPAFLO 8 MG CAPS capsule Take 1 capsule (8 mg total) by mouth daily. 10/26/14  Yes [name], MD  simvastatin (ZOCOR) 40 MG tablet Take 40 mg  by mouth daily.    Yes Historical Provider, MD  traZODone (DESYREL) 50 MG tablet TAKE 1/2 TO 1 TABLET BY MOUTH AT BEDTIME AS NEEDED FOR SLEEP 12/21/14  Yes [name], MD  ibuprofen (ADVIL,MOTRIN) 600 MG tablet Take 1 tablet (600 mg total) by mouth every 6 (six) hours as needed. 03/12/15   Ankit Nanavati, MD   BP 137/96 mmHg  Pulse 72  Temp(Src) 98 F (36.7 C) (Oral)  Resp 16  Ht 6' (1.829 m)  Wt 318 lb (144.244 kg)  BMI 43.12 kg/m2  SpO2 99% Physical Exam  Constitutional: He is oriented to person, place, and time. He appears well-developed.  HENT:  Head: Atraumatic.  Neck: Neck supple.  Cardiovascular: Normal rate.   Pulmonary/Chest: Effort normal.  Musculoskeletal:  Neg thompson's test. Pt has tenderness over the achilles tendon insertion point. No swelling or warmth in that area. Tenderness also present over the tallus, posteriorly and inferiorly. Ankle ROM is intact. No effusion appreciated. Pt has tenderness with dorsiflexion of the ankle  Neurological: He is alert and oriented to person, place, and time.  Skin: Skin is warm.  Nursing note and vitals reviewed.   ED Course  Procedures (including critical care time) Labs Review Labs Reviewed - No data to display  Imaging Review Dg Ankle Complete  Left  03/12/2015   CLINICAL DATA:  Acute onset of tenderness over the left Achilles tendon, with medial ankle pain. Initial encounter.  EXAM: LEFT ANKLE COMPLETE - 3+ VIEW  COMPARISON:  None.  FINDINGS: There is no evidence of fracture or dislocation. The ankle mortise is intact; the interosseous space is within normal limits. No talar tilt or subluxation is seen. Mild subcortical cystic change is noted at the medial talar dome.  The joint spaces are preserved. No significant soft tissue abnormalities are seen. The visualized Achilles tendon is grossly unremarkable.  IMPRESSION: 1. No evidence of fracture or dislocation. 2. Mild subcortical cystic change at the medial talar dome.   Electronically Signed   By: Garald Balding M.D.   On: 03/12/2015 05:08   I have personally reviewed and evaluated these images and lab results as part of my medical decision-making.   EKG Interpretation None      MDM   Final diagnoses:  Tendinitis of ankle or foot    Pt comes in with ankle pain. Xrays are normal. I am thinking the pain is tendinitis, but the location is more diffuse and not focal to achilles tendon only. Will give him proper f/u. No clinical concerns for infection.   Varney Biles, MD 03/12/15 [phone]

## 2015-03-12 NOTE — ED Notes (Signed)
Ambulatory with no apparent difficulty, walks well with cane, refuses wheelchair.

## 2015-03-12 NOTE — Discharge Instructions (Signed)
We saw you in the ER for the pain around your foot and ankle. All the results in the ER are normal. We are not sure what is causing your symptoms, but i think there might be tendinitis or cyst in that area. The workup in the ER is not complete, and is limited to screening for life threatening and emergent conditions only, so please see the sports orthopedic doctor.  Elastic Bandage and RICE WHAT DOES AN ELASTIC BANDAGE DO? Elastic bandages come in different shapes and sizes. They generally provide support to your injury and reduce swelling while you are healing, but they can perform different functions. Your health care provider will help you to decide what is best for your protection, recovery, or rehabilitation following an injury. WHAT ARE SOME GENERAL TIPS FOR USING AN ELASTIC BANDAGE?  Use the bandage as directed by the maker of the bandage that you are using.  Do not wrap the bandage too tightly. This may cut off the circulation in the arm or leg in the area below the bandage.  If part of your body beyond the bandage becomes blue, numb, cold, swollen, or is more painful, your bandage is most likely too tight. If this occurs, remove your bandage and reapply it more loosely.  See your health care provider if the bandage seems to be making your problems worse rather than better.  An elastic bandage should be removed and reapplied every 3-4 hours or as directed by your health care provider. WHAT IS RICE? The routine care of many injuries includes rest, ice, compression, and elevation (RICE therapy).  Rest Rest is required to allow your body to heal. Generally, you can resume your routine activities when you are comfortable and have been given permission by your health care provider. Ice Icing your injury helps to keep the swelling down and it reduces pain. Do not apply ice directly to your skin.  Put ice in a plastic bag.  Place a towel between your skin and the bag.  Leave the ice on  for 20 minutes, 2-3 times per day. Do this for as long as you are directed by your health care provider. Compression Compression helps to keep swelling down, gives support, and helps with discomfort. Compression may be done with an elastic bandage. Elevation Elevation helps to reduce swelling and it decreases pain. If possible, your injured area should be placed at or above the level of your heart or the center of your chest. [place]? You should seek medical care if:  You have persistent pain and swelling.  Your symptoms are getting worse rather than improving. These symptoms may indicate that further evaluation or further X-rays are needed. Sometimes, X-rays may not show a small broken bone (fracture) until a number of days later. Make a follow-up appointment with your health care provider. Ask when your X-ray results will be ready. Make sure that you get your X-ray results. WHEN SHOULD I SEEK IMMEDIATE MEDICAL CARE? You should seek immediate medical care if:  You have a sudden onset of severe pain at or below the area of your injury.  You develop redness or increased swelling around your injury.  You have tingling or numbness at or below the area of your injury that does not improve after you remove the elastic bandage.   This information is not intended to replace advice given to you by your health care provider. Make sure you discuss any questions you have with your health care  provider.   Document Released: 11/11/2001 Document Revised: 02/10/2015 Document Reviewed: 01/05/2014 Elsevier Interactive Patient Education 2016 Elsevier Inc. Achilles Tendinitis Achilles tendinitis is inflammation of the tough, cord-like band that attaches the lower muscles of your leg to your heel (Achilles tendon). It is usually caused by overusing the tendon and joint involved.  CAUSES Achilles tendinitis can happen because of:  A sudden increase in exercise or activity (such as  running).  Doing the same exercises or activities (such as jumping) over and over.  Not warming up calf muscles before exercising.  Exercising in shoes that are worn out or not made for exercise.  Having arthritis or a bone growth on the back of the heel bone. This can rub against the tendon and hurt the tendon. SIGNS AND SYMPTOMS The most common symptoms are:  Pain in the back of the leg, just above the heel. The pain usually gets worse with exercise and better with rest.  Stiffness or soreness in the back of the leg, especially in the morning.  Swelling of the skin over the Achilles tendon.  Trouble standing on tiptoe. Sometimes, an Achilles tendon tears (ruptures). Symptoms of an Achilles tendon rupture can include:  Sudden, severe pain in the back of the leg.  Trouble putting weight on the foot or walking normally. DIAGNOSIS Achilles tendinitis will be diagnosed based on symptoms and a physical examination. An X-ray may be done to check if another condition is causing your symptoms. An MRI may be ordered if your health care provider suspects you may have completely torn your tendon, which is called an Achilles tendon rupture.  TREATMENT  Achilles tendinitis usually gets better over time. It can take weeks to months to heal completely. Treatment focuses on treating the symptoms and helping the injury heal. HOME CARE INSTRUCTIONS   Rest your Achilles tendon and avoid activities that cause pain.  Apply ice to the injured area:  Put ice in a plastic bag.  Place a towel between your skin and the bag.  Leave the ice on for 20 minutes, 2-3 times a day  Try to avoid using the tendon (other than gentle range of motion) while the tendon is painful. Do not resume use until instructed by your health care provider. Then begin use gradually. Do not increase use to the point of pain. If pain does develop, decrease use and continue the above measures. Gradually increase activities that do  not cause discomfort until you achieve normal use.  Do exercises to make your calf muscles stronger and more flexible. Your health care provider or physical therapist can recommend exercises for you to do.  Wrap your ankle with an elastic bandage or other wrap. This can help keep your tendon from moving too much. Your health care provider will show you how to wrap your ankle correctly.  Only take over-the-counter or prescription medicines for pain, discomfort, or fever as directed by your health care provider. SEEK MEDICAL CARE IF:   Your pain and swelling increase or pain is uncontrolled with medicines.  You develop new, unexplained symptoms or your symptoms get worse.  You are unable to move your toes or foot.  You develop warmth and swelling in your foot.  You have an unexplained temperature. MAKE SURE YOU:   Understand these instructions.  Will watch your condition.  Will get help right away if you are not doing well or get worse.   This information is not intended to replace advice given to you by your  health care provider. Make sure you discuss any questions you have with your health care provider.   Document Released: 03/01/2005 Document Revised: 06/12/2014 Document Reviewed: 01/01/2013 Elsevier Interactive Patient Education [employer].

## 2015-03-12 NOTE — Telephone Encounter (Signed)
Received fax from [facility] requesting prescription for Lidocaine 5% Ointment.   Call placed to patient to inquire if patient requested prescription.   [place].

## 2015-03-15 NOTE — Telephone Encounter (Signed)
Call placed to patient. LMTRC.  

## 2015-03-16 NOTE — Telephone Encounter (Signed)
Call placed to patient. LMTRC.  

## 2015-03-16 NOTE — Telephone Encounter (Signed)
Received return call from patient.   Reports that he did request prescription pain cream for knee and ankle pain.   MD to be made aware.

## 2015-03-22 DIAGNOSIS — M10072 Idiopathic gout, left ankle and foot: Secondary | ICD-10-CM | POA: Diagnosis not present

## 2015-03-23 ENCOUNTER — Encounter: Payer: Self-pay | Admitting: Family Medicine

## 2015-03-23 ENCOUNTER — Other Ambulatory Visit: Payer: Self-pay | Admitting: Family Medicine

## 2015-03-23 ENCOUNTER — Ambulatory Visit (INDEPENDENT_AMBULATORY_CARE_PROVIDER_SITE_OTHER): Payer: Medicare Other | Admitting: Family Medicine

## 2015-03-23 ENCOUNTER — Other Ambulatory Visit: Payer: Self-pay | Admitting: Internal Medicine

## 2015-03-23 VITALS — BP 130/76 | HR 78 | Temp 98.4°F | Resp 14 | Ht 72.0 in | Wt 322.0 lb

## 2015-03-23 DIAGNOSIS — M6588 Other synovitis and tenosynovitis, other site: Secondary | ICD-10-CM | POA: Diagnosis not present

## 2015-03-23 DIAGNOSIS — K625 Hemorrhage of anus and rectum: Secondary | ICD-10-CM | POA: Diagnosis not present

## 2015-03-23 DIAGNOSIS — K219 Gastro-esophageal reflux disease without esophagitis: Secondary | ICD-10-CM

## 2015-03-23 DIAGNOSIS — K2970 Gastritis, unspecified, without bleeding: Secondary | ICD-10-CM

## 2015-03-23 DIAGNOSIS — M1712 Unilateral primary osteoarthritis, left knee: Secondary | ICD-10-CM | POA: Diagnosis not present

## 2015-03-23 DIAGNOSIS — M7750 Other enthesopathy of unspecified foot: Secondary | ICD-10-CM

## 2015-03-23 LAB — CBC W/MCH & 3 PART DIFF
HCT: 47.1 % (ref 39.0–52.0)
Hemoglobin: 16.1 g/dL (ref 13.0–17.0)
Lymphocytes Relative: 45 % (ref 12–46)
Lymphs Abs: 3.4 10*3/uL (ref 0.7–4.0)
MCH: 32 pg (ref 26.0–34.0)
MCHC: 34.2 g/dL (ref 30.0–36.0)
MCV: 93.6 fL (ref 78.0–100.0)
Neutro Abs: 3.5 10*3/uL (ref 1.7–7.7)
Neutrophils Relative %: 46 % (ref 43–77)
Platelets: 148 10*3/uL — ABNORMAL LOW (ref 150–400)
RBC: 5.03 MIL/uL (ref 4.22–5.81)
RDW: 14.8 % (ref 11.5–15.5)
WBC mixed population %: 9 % (ref 3–18)
WBC mixed population: 0.7 10*3/uL (ref 0.1–1.8)
WBC: 7.5 10*3/uL (ref 4.0–10.5)

## 2015-03-23 MED ORDER — DEXLANSOPRAZOLE 60 MG PO CPDR: 60.0000 mg | DELAYED_RELEASE_CAPSULE | Freq: Every day | ORAL | Status: DC

## 2015-03-23 MED ORDER — HYDROCORTISONE ACETATE 25 MG RE SUPP: 25.0000 mg | Freq: Two times a day (BID) | RECTAL | Status: DC

## 2015-03-23 MED ORDER — HYDROCORTISONE 2.5 % EX CREA: TOPICAL_CREAM | CUTANEOUS | Status: DC

## 2015-03-23 NOTE — Telephone Encounter (Signed)
Ok to refill?  Patient currently on Plavix.

## 2015-03-23 NOTE — Patient Instructions (Addendum)
Try the dexilant- take 1 capsule daily Use the cortisone suppository Sitz bath for next few days Referral to Dr. Collene Mares We will call with hemoglobin results Referral to Dr. Ricki Rodriguez for your knee and ankle Release of records- Dr. Eulas Post- [facility]- [place] NO IBUPROFEN, NO ALEVE,NO ETODALAC,NO DEXAMETHASONE

## 2015-03-23 NOTE — Progress Notes (Signed)
Patient ID: Peter Dunlap, male   DOB: [birth year], 59 y.o.   MRN: [id number]   Subjective:    Patient ID: Peter Dunlap, male    DOB: 01-Jun-1956, 59 y.o.   MRN: [id number]  Patient presents for Blood in Stool  Pt noted BRBPR for the past week, he is on Plavix , last colonoscopy in 2012. He denies any hard stools but did have some hemorrhoidal bleeding a few weeks ago as well which he treated with over-the-counter Preparation H. He did have some loose stools for a couple of days. He denies any lower abdominal pain but has had epigastric pain and a burning sensation in his stomach. He does have remote history of ulcers per report. He has been taking Nexium but this has not helped very much. He admits that he had a couple episodes of dark stools. He and I's any chest pain or shortness of breath.  At the end of the visit he called his wife to clarify his medications he was getting medications from his orthopedist has been treating him for his chronic knee pain. He is actually given dexamethasone as well as etodolac he also has ibuprofen at home and hydrocodone he did not know that he was supposed to take all of these medications.   Note he was also in the emergency room recently for tendinitis of his ankle his orthopedist  ( OA knee- needs replacement is also retiring therefore he needs referral to a new orthopedist for both of these conditions   Review Of Systems:  GEN- denies fatigue, fever, weight loss,weakness, recent illness HEENT- denies eye drainage, change in vision, nasal discharge, CVS- denies chest pain, palpitations RESP- denies SOB, cough, wheeze ABD- denies N/V, +change in stools, +abd pain GU- denies dysuria, hematuria, dribbling, incontinence Neuro- denies headache, dizziness, syncope, seizure activity       Objective:    BP 130/76 mmHg  Pulse 78  Temp(Src) 98.4 F (36.9 C) (Oral)  Resp 14  Ht 6' (1.829 m)  Wt 322 lb (146.058 kg)  BMI 43.66 kg/m2 GEN- NAD, alert  and oriented x3 CVS- irregular rhythem, normal rate, no murmur RESP-CTAB ABD-NABS,soft,TTP epigastric region,no rebound, no guarding Rectum- no external lesions, internal hemorroid seen- FOBT positive, soft stool in vault  EXT- No edema Pulses- Radial  2+        Assessment & Plan:      Problem List Items Addressed This Visit    GERD - Primary (Chronic)   Relevant Medications   dexlansoprazole (DEXILANT) 60 MG capsule    Other Visit Diagnoses    Rectal bleeding        Based on exam internal hemorroids, will give sitz baths, hydrocortisone suppository, will also set up with GI, Hb is stable, recheck via phone in 2 days    Relevant Orders    CBC w/[facility] & 3 Part Diff (Completed)    Gastritis        Concern for gastritis, taking steroids and lots of NSAIDS, will stop both, given dexilant from office, history of ulcers and ? dark stools send to GI , for now I will have him continue the plavix, he is hemodynamically stable, Hb stable       Note: This dictation was prepared with Dragon dictation along with smaller phrase technology. Any transcriptional errors that result from this process are unintentional.

## 2015-03-23 NOTE — Telephone Encounter (Signed)
Refill appropriate and filled per protocol. 

## 2015-03-23 NOTE — Addendum Note (Signed)
Addended by: Sheral Flow on: 03/23/2015 02:47 PM   Modules accepted: Orders, Medications

## 2015-03-24 ENCOUNTER — Ambulatory Visit: Payer: Medicare Other | Admitting: Family Medicine

## 2015-03-25 DIAGNOSIS — K5900 Constipation, unspecified: Secondary | ICD-10-CM | POA: Diagnosis not present

## 2015-03-25 DIAGNOSIS — K602 Anal fissure, unspecified: Secondary | ICD-10-CM | POA: Diagnosis not present

## 2015-03-25 DIAGNOSIS — K625 Hemorrhage of anus and rectum: Secondary | ICD-10-CM | POA: Diagnosis not present

## 2015-03-25 DIAGNOSIS — K219 Gastro-esophageal reflux disease without esophagitis: Secondary | ICD-10-CM | POA: Diagnosis not present

## 2015-03-29 ENCOUNTER — Ambulatory Visit (INDEPENDENT_AMBULATORY_CARE_PROVIDER_SITE_OTHER): Payer: Medicare Other | Admitting: *Deleted

## 2015-03-29 DIAGNOSIS — I495 Sick sinus syndrome: Secondary | ICD-10-CM | POA: Diagnosis not present

## 2015-03-30 NOTE — Progress Notes (Signed)
Remote pacemaker transmission.   

## 2015-04-02 ENCOUNTER — Telehealth: Payer: Self-pay | Admitting: Internal Medicine

## 2015-04-02 ENCOUNTER — Other Ambulatory Visit: Payer: Self-pay | Admitting: Physician Assistant

## 2015-04-02 NOTE — Telephone Encounter (Signed)
Spoke with patient he is going to call back and tell us if he taking metoprolol ER succinate 25 mg tabs or metoprolol tartrate 25 mg. Claiborne Billings said to refill whichever one was listed on the patients prescription bottle. The pharmacy sent in a refill request for metoprolol ER succinate 25 mg tabs.

## 2015-04-05 ENCOUNTER — Encounter (HOSPITAL_COMMUNITY): Payer: Self-pay | Admitting: General Practice

## 2015-04-05 ENCOUNTER — Emergency Department (HOSPITAL_COMMUNITY)
Admission: EM | Admit: 2015-04-05 | Discharge: 2015-04-05 | Disposition: A | Discharge status: Home / Self Care | Payer: Medicare Other | Attending: Emergency Medicine | Admitting: Emergency Medicine

## 2015-04-05 DIAGNOSIS — M1990 Unspecified osteoarthritis, unspecified site: Secondary | ICD-10-CM | POA: Diagnosis not present

## 2015-04-05 DIAGNOSIS — G4730 Sleep apnea, unspecified: Secondary | ICD-10-CM | POA: Diagnosis not present

## 2015-04-05 DIAGNOSIS — K219 Gastro-esophageal reflux disease without esophagitis: Secondary | ICD-10-CM | POA: Diagnosis not present

## 2015-04-05 DIAGNOSIS — M76892 Other specified enthesopathies of left lower limb, excluding foot: Secondary | ICD-10-CM | POA: Diagnosis not present

## 2015-04-05 DIAGNOSIS — E669 Obesity, unspecified: Secondary | ICD-10-CM | POA: Insufficient documentation

## 2015-04-05 DIAGNOSIS — I499 Cardiac arrhythmia, unspecified: Secondary | ICD-10-CM | POA: Diagnosis not present

## 2015-04-05 DIAGNOSIS — I10 Essential (primary) hypertension: Secondary | ICD-10-CM | POA: Diagnosis not present

## 2015-04-05 DIAGNOSIS — K002 Abnormalities of size and form of teeth: Secondary | ICD-10-CM | POA: Insufficient documentation

## 2015-04-05 DIAGNOSIS — K029 Dental caries, unspecified: Secondary | ICD-10-CM | POA: Insufficient documentation

## 2015-04-05 DIAGNOSIS — Z8673 Personal history of transient ischemic attack (TIA), and cerebral infarction without residual deficits: Secondary | ICD-10-CM | POA: Diagnosis not present

## 2015-04-05 DIAGNOSIS — Z9861 Coronary angioplasty status: Secondary | ICD-10-CM | POA: Diagnosis not present

## 2015-04-05 DIAGNOSIS — I252 Old myocardial infarction: Secondary | ICD-10-CM | POA: Diagnosis not present

## 2015-04-05 DIAGNOSIS — M25572 Pain in left ankle and joints of left foot: Secondary | ICD-10-CM

## 2015-04-05 DIAGNOSIS — I509 Heart failure, unspecified: Secondary | ICD-10-CM | POA: Insufficient documentation

## 2015-04-05 DIAGNOSIS — E079 Disorder of thyroid, unspecified: Secondary | ICD-10-CM | POA: Diagnosis not present

## 2015-04-05 DIAGNOSIS — Z9981 Dependence on supplemental oxygen: Secondary | ICD-10-CM | POA: Diagnosis not present

## 2015-04-05 DIAGNOSIS — E785 Hyperlipidemia, unspecified: Secondary | ICD-10-CM | POA: Diagnosis not present

## 2015-04-05 DIAGNOSIS — Z87891 Personal history of nicotine dependence: Secondary | ICD-10-CM | POA: Insufficient documentation

## 2015-04-05 DIAGNOSIS — F329 Major depressive disorder, single episode, unspecified: Secondary | ICD-10-CM | POA: Diagnosis not present

## 2015-04-05 DIAGNOSIS — Z79899 Other long term (current) drug therapy: Secondary | ICD-10-CM | POA: Diagnosis not present

## 2015-04-05 LAB — CUP PACEART REMOTE DEVICE CHECK
Battery Remaining Longevity: 122 mo
Battery Remaining Percentage: 95.5 %
Battery Voltage: 3.01 V
Brady Statistic AP VP Percent: 1 %
Brady Statistic AP VS Percent: 5.9 %
Brady Statistic AS VP Percent: 3.1 %
Brady Statistic AS VS Percent: 90 %
Brady Statistic RA Percent Paced: 4.7 %
Brady Statistic RV Percent Paced: 4.5 %
Date Time Interrogation Session: 20161022161334
Implantable Lead Implant Date: 20160415
Implantable Lead Implant Date: 20160415
Implantable Lead Location: 753859
Implantable Lead Location: 753860
Lead Channel Impedance Value: 460 Ohm
Lead Channel Impedance Value: 630 Ohm
Lead Channel Pacing Threshold Amplitude: 0.5 V
Lead Channel Pacing Threshold Amplitude: 0.75 V
Lead Channel Pacing Threshold Pulse Width: 0.5 ms
Lead Channel Pacing Threshold Pulse Width: 0.5 ms
Lead Channel Sensing Intrinsic Amplitude: 12 mV
Lead Channel Sensing Intrinsic Amplitude: 5 mV
Lead Channel Setting Pacing Amplitude: 2 V
Lead Channel Setting Pacing Amplitude: 2.5 V
Lead Channel Setting Pacing Pulse Width: 0.5 ms
Lead Channel Setting Sensing Sensitivity: 2 mV
Pulse Gen Model: 2240
Pulse Gen Serial Number: 7756161

## 2015-04-05 MED ORDER — HYDROMORPHONE HCL 1 MG/ML IJ SOLN
2.0000 mg | Freq: Once | INTRAMUSCULAR | Status: AC
  Administered 2015-04-05: 2 mg via INTRAMUSCULAR
  Filled 2015-04-05: qty 2

## 2015-04-05 NOTE — ED Provider Notes (Signed)
CSN: [id number]     Arrival date & time 04/05/15  1024 History   First MD Initiated Contact with Patient 04/05/15 1025     Chief Complaint  Patient presents with  . Leg Pain     (Consider location/radiation/quality/duration/timing/severity/associated sxs/prior Treatment) Patient is a 59 y.o. male presenting with ankle pain. The history is provided by the patient.  Ankle Pain Location:  Ankle Time since incident:  3 weeks Pain details:    Quality:  Sharp and throbbing   Radiates to:  L leg   Severity:  Severe   Duration:  3 weeks   Timing:  Constant   Progression:  Worsening Prior injury to area:  No Relieved by:  Nothing Worsened by:  Flexion, bearing weight and activity Ineffective treatments:  Ice, immobilization, rest, heat and elevation Associated symptoms: no back pain, no decreased ROM, no fatigue, no fever, no muscle weakness, no neck pain, no numbness, no swelling and no tingling    Patient with left ankle pain which has been gradually worsening since its onset approximately 3 weeks ago. He states that the pain has become severe that he has not slept in 2 days, despite taking 10 mg of "oxy."  His pain is located on the front of his foot and ankle, with some pain over lateral side of ankle, with radiation up his leg, worsened with palpation, dorsiflexion and with walking.  He was given a post-op shoe 3 weeks ago, states that this has not helped much, and the straps exacerbate his pain. He also has iced it and elevated without improvement.  He has a hx of bleeds and is unable to take NSAIDS. He has attempted to see his orthopedic surgeon however he just retired last week, and he is awaiting an appointment with a new doctor on November 7.  He denies redness, swelling, fever, chills, sweats, weakness, numbness.  He has no other complaints  Past Medical History  Diagnosis Date  . Hypertension     Severe LVH with normal EF  . Arteriosclerotic cardiovascular disease (ASCVD) 2005    catheterization in 10/2010:50% mid LAD, diffuse distal disease, circumflex irregularities, large dominant RCA with a 50% ostial, 70% distal, 60% posterolateral and 70% PDA; normal EF  . Cerebrovascular disease 2010    R. carotid endarterectomy; Duplex in 10/2010-widely patent ICAs, subtotal left vertebral-not thought to be contributing to symptoms  . Hyperlipidemia   . Obesity   . Tobacco abuse     Quit 2014  . Benign prostatic hypertrophy   . Low back pain   . History of PSVT (paroxysmal supraventricular tachycardia) 2005    Diagnosed with ILR; no recurrence following RFA  . Cervical spine disease     CT in 2012-advanced degeneration and spondylosis with moderate spinal stenosis at C3-C6  . H/O: substance abuse     Cocaine, marijuana, alcohol.  Quit 2013.   [name] Syncope   . Gastroesophageal reflux disease   . Depression   . Erectile dysfunction   . CHF (congestive heart failure) ([place])   . H/O hiatal hernia   . Headache(784.0)   . Arthritis   . Pericardial effusion without cardiac tamponade 12/03/2013  . Non-ST elevation myocardial infarction (NSTEMI), initial episode of care [facility]) 12/02/2013    DES LAD  . Thyroid disease   . Sleep apnea     CPAP  . Tachy-brady syndrome ([place])   . Presence of permanent cardiac pacemaker 09/18/2014  . Carotid artery occlusion     left  Past Surgical History  Procedure Laterality Date  . Radiofrequency ablation  2005    for PSVT  . Coronary angioplasty with stent placement  12/03/2013    LAD 90%-->0% W/ Promus Premier DES 3.0 mm x 16 mm, CFX OK, RCA 40%, EF 70-75%  . Carotid endarterectomy Right Feb. 25, 2010     CEA  . Trudee Kuster hole Right 04/13/2014    Procedure: Haskell Flirt;  Surgeon: Charlie Pitter, MD;  Location: [place] NEURO ORS;  Service: Neurosurgery;  Laterality: Right;  . Left heart catheterization with coronary angiogram Left 12/03/2013    Procedure: LEFT HEART CATHETERIZATION WITH CORONARY ANGIOGRAM;  Surgeon: Leonie Man, MD;  Location: [facility] CATH  LAB;  Service: Cardiovascular;  Laterality: Left;  . Percutaneous coronary stent intervention (pci-s)  12/03/2013    Procedure: PERCUTANEOUS CORONARY STENT INTERVENTION (PCI-S);  Surgeon: Leonie Man, MD;  Location: [facility] CATH LAB;  Service: Cardiovascular;;  . Left heart catheterization with coronary angiogram N/A 01/26/2014    Procedure: LEFT HEART CATHETERIZATION WITH CORONARY ANGIOGRAM;  Surgeon: Jettie Booze, MD;  Location: [facility] CATH LAB;  Service: Cardiovascular;  Laterality: N/A;  . Left heart catheterization with coronary angiogram N/A 08/03/2014    Procedure: LEFT HEART CATHETERIZATION WITH CORONARY ANGIOGRAM;  Surgeon: Burnell Blanks, MD;  Location: [facility] CATH LAB;  Service: Cardiovascular;  Laterality: N/A;  . Permanent pacemaker insertion N/A 09/18/2014    Procedure: PERMANENT PACEMAKER INSERTION;  Surgeon: [name], MD;  Location: [facility] CATH LAB;  Service: Cardiovascular;  Laterality: N/A;  . Brain surgery  2015    hematoma evacuation   Family History  Problem Relation Age of Onset  . Hypertension Mother     Cerebrovascular disease  . Diabetes Mother   . Coronary artery disease Father 61  . Diabetes type II Father   . Hypertension Father   . Heart attack Father   . Lung cancer Paternal Uncle   . Diabetes Sister   . Hypertension Sister   . Heart attack Sister 53  . Diabetes Brother   . Hypertension Brother    Social History  Substance Use Topics  . Smoking status: Former Smoker -- 1.00 packs/day for 40 years    Types: Cigarettes    Start date: 10/20/1972    Quit date: 10/10/2012  . Smokeless tobacco: Never Used     Comment: Quit in May.   . Alcohol Use: No     Comment: former drinker-- sober since 2013.     Review of Systems  Constitutional: Negative for fever, chills, diaphoresis and fatigue.  HENT: Negative.   Respiratory: Negative for chest tightness and shortness of breath.   Cardiovascular: Negative for chest pain, palpitations and leg swelling.    Gastrointestinal: Negative.  Negative for nausea, vomiting and abdominal pain.  Genitourinary: Negative.   Musculoskeletal: Positive for arthralgias. Negative for back pain, joint swelling and neck pain.  Skin: Negative for color change.  Neurological: Negative for weakness and numbness.  Psychiatric/Behavioral: Negative.       Allergies  Lactose intolerance (gi)  Home Medications   Prior to Admission medications   Medication Sig Start Date End Date Taking? Authorizing Provider  colchicine 0.6 MG tablet Take 1 tablet (0.6 mg total) by mouth 2 (two) times daily as needed (for gout). 09/19/14   Isaiah Serge, NP  dexlansoprazole (DEXILANT) 60 MG capsule Take 1 capsule (60 mg total) by mouth daily. 03/23/15   [name], MD  furosemide (LASIX) 20 MG tablet  TAKE 3 TABLETS BY MOUTH DAILY 03/23/15   [name], MD  HYDROcodone-acetaminophen [facility]) 10-325 MG per tablet Take 1 tablet by mouth 3 (three) times daily. 08/26/14   Historical Provider, MD  hydrocortisone 2.5 % cream Apply rectally BID. Please dispense with rectal tip. 03/23/15   [name], MD  isosorbide mononitrate (IMDUR) 60 MG 24 hr tablet TAKE 1 TABLET BY MOUTH EVERY DAY 02/10/15   Burtis Junes, NP  levothyroxine (SYNTHROID, LEVOTHROID) 150 MCG tablet Take 150 mcg by mouth daily before breakfast.    Historical Provider, MD  metoprolol tartrate (LOPRESSOR) 25 MG tablet Take 1 tablet (25 mg total) by mouth 2 (two) times daily. 12/21/14   Ripudeep Krystal Eaton, MD  NITROSTAT 0.4 MG SL tablet PLACE 1 TABLET UNDER THE TONGUE EVERY 5 MINUTES AS NEEDED FOR CHEST PAIN 01/25/15   Evelene Croon Barrett, PA-C  potassium chloride SA (K-DUR,KLOR-CON) 20 MEQ tablet TAKE 1 TABLET BY MOUTH EVERY DAY 10/05/14   [name], MD  RAPAFLO 8 MG CAPS capsule Take 1 capsule (8 mg total) by mouth daily. 10/26/14   [name], MD  simvastatin (ZOCOR) 40 MG tablet Take 40 mg by mouth daily.     Historical Provider, MD  traZODone (DESYREL) 50 MG  tablet TAKE 1/2 TO 1 TABLET BY MOUTH AT BEDTIME AS NEEDED FOR SLEEP 12/21/14   [name], MD   BP 136/86 mmHg  Pulse 101  Temp(Src) 97.5 F (36.4 C) (Oral)  Resp 18  Ht 6' (1.829 m)  Wt 320 lb (145.151 kg)  BMI 43.39 kg/m2  SpO2 98% Physical Exam  Constitutional: He is oriented to person, place, and time. Vital signs are normal. He appears well-developed and well-nourished. He is cooperative.  Non-toxic appearance. No distress.  HENT:  Head: Normocephalic and atraumatic. Head is without right periorbital erythema and without left periorbital erythema.  Right Ear: Hearing, tympanic membrane, external ear and ear canal normal. No drainage, swelling or tenderness. No mastoid tenderness. Tympanic membrane is not injected, not perforated and not erythematous. No middle ear effusion.  Left Ear: Hearing, tympanic membrane, external ear and ear canal normal. No drainage, swelling or tenderness. No mastoid tenderness. Tympanic membrane is not injected, not perforated and not erythematous.  No middle ear effusion.  Nose: Nose normal. No mucosal edema, rhinorrhea or sinus tenderness. Right sinus exhibits no maxillary sinus tenderness and no frontal sinus tenderness. Left sinus exhibits no maxillary sinus tenderness and no frontal sinus tenderness.  Mouth/Throat: Uvula is midline. Mucous membranes are not pale, not dry and not cyanotic. No oral lesions. No trismus in the jaw. Abnormal dentition. Dental caries present. No dental abscesses, uvula swelling or lacerations. No posterior oropharyngeal edema, posterior oropharyngeal erythema or tonsillar abscesses.     Eyes: Conjunctivae, EOM and lids are normal. Pupils are equal, round, and reactive to light. Right eye exhibits no chemosis and no discharge. Left eye exhibits no chemosis and no discharge. No scleral icterus.  Neck: Trachea normal, normal range of motion, full passive range of motion without pain and phonation normal. Neck supple. No JVD  present. No tracheal tenderness present. No rigidity. No tracheal deviation, no edema, no erythema and normal range of motion present. No thyroid mass present.  Cardiovascular: Normal rate.  An irregularly irregular rhythm present. Exam reveals no gallop and no friction rub.   No murmur heard. Pulses:      Radial pulses are 2+ on the right side, and 2+ on the  left side.       Dorsalis pedis pulses are 2+ on the right side, and 2+ on the left side.  Pulmonary/Chest: Effort normal and breath sounds normal. No accessory muscle usage or stridor. No tachypnea. No respiratory distress. He has no decreased breath sounds. He has no wheezes. He has no rhonchi. Chest wall is not dull to percussion. He exhibits no deformity and no retraction.  Abdominal: Soft. Bowel sounds are normal. He exhibits no distension.  Musculoskeletal: Normal range of motion. He exhibits tenderness. He exhibits no edema.       Left ankle: He exhibits normal range of motion, no swelling, no ecchymosis, no deformity and normal pulse. Tenderness. Lateral malleolus tenderness found. Achilles tendon normal. Achilles tendon exhibits no pain, no defect and normal Thompson's test results.       Feet:  Left ankle - normal appearing with inspection, no deformity, erythema, or edema.  No warmth or induration.  Normal ROM, passive and active.  ttp to dorsum of foot and anterior ankle, ttp to lateral ankle, no crepitus palpated, no effusion  Lymphadenopathy:    He has no cervical adenopathy.  Neurological: He is alert and oriented to person, place, and time. He exhibits normal muscle tone. Coordination normal.  Skin: Skin is warm and dry. No rash noted. He is not diaphoretic. No cyanosis or erythema. No pallor. Nails show no clubbing.  Psychiatric: He has a normal mood and affect. His speech is normal and behavior is normal. Judgment and thought content normal. Cognition and memory are normal.    ED Course  Procedures (including critical care  time) Labs Review Labs Reviewed - No data to display  Imaging Review No results found. I have personally reviewed and evaluated these images and lab results as part of my medical decision-making.   EKG Interpretation None      MDM   Final diagnoses:  Left ankle pain    The patient with 3 weeks of left ankle pain, diagnosed with tendinitis. Patient has had increasing pain, presented back to ER for evaluation.  On exam patient has some tenderness to the dorsum of left foot and to lateral ankle. No Achilles tendon tenderness, neg thompson. Normal range of motion, normal pulses, normal appearance without deformity, edema or erythema.  Patient was requesting a different brace. A cam walker was ordered with crutches, an IM dose of Dilaudid was ordered, pt requested since that is what he had with his last ER visit and it provided relief. Patient has follow-up established with his orthopedic surgeon in 6 days. Rice therapy was reviewed. Patient was unable to take NSAIDs due to bleeds.  He had a ride home, had adequate control of his pain while in the ED.  Medications  HYDROmorphone (DILAUDID) injection 2 mg (2 mg Intramuscular Given 04/05/15 1129)   Filed Vitals:   04/05/15 1115 04/05/15 1130 04/05/15 1145 04/05/15 1200  BP: 134/106 135/95 131/82 136/86  Pulse: 139 67 40 101  Temp:      TempSrc:      Resp:      Height:      Weight:      SpO2: 97% 100% 100% 98%    Pt has a documented hx of tachy-brady, pulse was irregularly-irregular, per his baseline.  He had intermittent HTN, likely due to pain.  He denies SOB, exertional and at rest, denies orthopnea, CP, weakness.  He is well established with cardiology and 1 week ago had his device interrogated.  No concerns  for acute cardiac pathology.          Delsa Grana, PA-C 04/06/15 5456  Noemi Chapel, MD 04/07/15 [phone]

## 2015-04-05 NOTE — ED Provider Notes (Signed)
The patient is 59 years old, he has a history of congestive heart failure, he has been having ankle pain for over a month, initially was posterior near the Achilles tendon but over the last month has been progressively more anterior. He has difficulty with ambulating because of pain with range of motion. On exam he has tenderness over the dorsum of the foot and ankle, no redness, no obvious effusion, fairly good range of motion with minimal pain, normal pulses, normal sensation, no deformity. Imaging was obtained during the last visit, he has not had another injury since that time, we'll place in a walking boot, the patient will follow up with his orthopedist with whom he has a follow-up on November 7.  Medical screening examination/treatment/procedure(s) were conducted as a shared visit with non-physician practitioner(s) and myself.  I personally evaluated the patient during the encounter.  Clinical Impression:   Final diagnoses:  Left ankle pain         Noemi Chapel, MD 04/07/15 [phone]

## 2015-04-05 NOTE — Progress Notes (Signed)
Orthopedic Tech Progress Note Patient Details:  Peter Dunlap 1955/12/25 [id number]  Ortho Devices Type of Ortho Device: CAM walker Ortho Device/Splint Interventions: Application   [name] 04/05/2015, 12:16 PM

## 2015-04-05 NOTE — ED Notes (Signed)
Pt presents with left leg pain. Pt reports the pain as throbbing and is rating it a 10/10. Pt was recently seen, and sent home with a support shoe and pain medication. Per patient this shoe is making it worse, and the pain medication is not working. Pt has full sensation in foot and leg, and has palpable pulses.

## 2015-04-05 NOTE — Discharge Instructions (Signed)
Tendinitis Tendinitis is swelling and inflammation of the tendons. Tendons are band-like tissues that connect muscle to bone. Tendinitis commonly occurs in the:   Shoulders (rotator cuff).  Heels (Achilles tendon).  Elbows (triceps tendon). CAUSES Tendinitis is usually caused by overusing the tendon, muscles, and joints involved. When the tissue surrounding a tendon (synovium) becomes inflamed, it is called tenosynovitis. Tendinitis commonly develops in people whose jobs require repetitive motions. SYMPTOMS  Pain.  Tenderness.  Mild swelling. DIAGNOSIS Tendinitis is usually diagnosed by physical exam. Your health care provider may also order X-rays or other imaging tests. TREATMENT Your health care provider may recommend certain medicines or exercises for your treatment. HOME CARE INSTRUCTIONS   Use a sling or splint for as long as directed by your health care provider until the pain decreases.  Put ice on the injured area.  Put ice in a plastic bag.  Place a towel between your skin and the bag.  Leave the ice on for 15-20 minutes, 3-4 times a day, or as directed by your health care provider.  Avoid using the limb while the tendon is painful. Perform gentle range of motion exercises only as directed by your health care provider. Stop exercises if pain or discomfort increase, unless directed otherwise by your health care provider.  Only take over-the-counter or prescription medicines for pain, discomfort, or fever as directed by your health care provider. SEEK MEDICAL CARE IF:   Your pain and swelling increase.  You develop new, unexplained symptoms, especially increased numbness in the hands. MAKE SURE YOU:   Understand these instructions.  Will watch your condition.  Will get help right away if you are not doing well or get worse.   This information is not intended to replace advice given to you by your health care provider. Make sure you discuss any questions you  have with your health care provider.   Document Released: 05/19/2000 Document Revised: 06/12/2014 Document Reviewed: 08/08/2010 Elsevier Interactive Patient Education 2016 Elsevier Inc[employer]   Ankle Pain Ankle pain is a common symptom. The bones, cartilage, tendons, and muscles of the ankle joint perform a lot of work each day. The ankle joint holds your body weight and allows you to move around. Ankle pain can occur on either side or back of 1 or both ankles. Ankle pain may be sharp and burning or dull and aching. There may be tenderness, stiffness, redness, or warmth around the ankle. The pain occurs more often when a person walks or puts pressure on the ankle. CAUSES  There are many reasons ankle pain can develop. It is important to work with your caregiver to identify the cause since many conditions can impact the bones, cartilage, muscles, and tendons. Causes for ankle pain include:  Injury, including a break (fracture), sprain, or strain often due to a fall, sports, or a high-impact activity.  Swelling (inflammation) of a tendon (tendonitis).  Achilles tendon rupture.  Ankle instability after repeated sprains and strains.  Poor foot alignment.  Pressure on a nerve (tarsal tunnel syndrome).  Arthritis in the ankle or the lining of the ankle.  Crystal formation in the ankle (gout or pseudogout). DIAGNOSIS  A diagnosis is based on your medical history, your symptoms, results of your physical exam, and results of diagnostic tests. Diagnostic tests may include X-ray exams or a computerized magnetic scan (magnetic resonance imaging, MRI). TREATMENT  Treatment will depend on the cause of your ankle pain and may include:  Keeping pressure off the ankle and limiting  activities.  Using crutches or other walking support (a cane or brace).  Using rest, ice, compression, and elevation.  Participating in physical therapy or home exercises.  Wearing shoe inserts or special shoes.  Losing  weight.  Taking medications to reduce pain or swelling or receiving an injection.  Undergoing surgery. HOME CARE INSTRUCTIONS   Only take over-the-counter or prescription medicines for pain, discomfort, or fever as directed by your caregiver.  Put ice on the injured area.  Put ice in a plastic bag.  Place a towel between your skin and the bag.  Leave the ice on for 15-20 minutes at a time, 03-04 times a day.  Keep your leg raised (elevated) when possible to lessen swelling.  Avoid activities that cause ankle pain.  Follow specific exercises as directed by your caregiver.  Record how often you have ankle pain, the location of the pain, and what it feels like. This information may be helpful to you and your caregiver.  Ask your caregiver about returning to work or sports and whether you should drive.  Follow up with your caregiver for further examination, therapy, or testing as directed. SEEK MEDICAL CARE IF:   Pain or swelling continues or worsens beyond 1 week.  You have an oral temperature above 102 F (38.9 C).  You are feeling unwell or have chills.  You are having an increasingly difficult time with walking.  You have loss of sensation or other new symptoms.  You have questions or concerns. MAKE SURE YOU:   Understand these instructions.  Will watch your condition.  Will get help right away if you are not doing well or get worse.   This information is not intended to replace advice given to you by your health care provider. Make sure you discuss any questions you have with your health care provider.   Document Released: 11/09/2009 Document Revised: 08/14/2011 Document Reviewed: 12/22/2014 Elsevier Interactive Patient Education [employer].

## 2015-04-07 ENCOUNTER — Encounter: Payer: Self-pay | Admitting: Cardiology

## 2015-04-12 DIAGNOSIS — M25572 Pain in left ankle and joints of left foot: Secondary | ICD-10-CM | POA: Diagnosis not present

## 2015-04-12 DIAGNOSIS — M79672 Pain in left foot: Secondary | ICD-10-CM | POA: Diagnosis not present

## 2015-04-12 DIAGNOSIS — M1711 Unilateral primary osteoarthritis, right knee: Secondary | ICD-10-CM | POA: Diagnosis not present

## 2015-04-19 ENCOUNTER — Other Ambulatory Visit: Payer: Self-pay

## 2015-04-19 DIAGNOSIS — G4733 Obstructive sleep apnea (adult) (pediatric): Secondary | ICD-10-CM | POA: Diagnosis not present

## 2015-04-19 MED ORDER — METOPROLOL TARTRATE 25 MG PO TABS: 25.0000 mg | ORAL_TABLET | Freq: Two times a day (BID) | ORAL | Status: DC

## 2015-04-19 NOTE — Telephone Encounter (Signed)
Pt called to get refill for metoprolol Tartrate 25 mg for 90 days I sent a refill for 90 days with 1 refill in and he will need to make an appointment after that.

## 2015-05-12 ENCOUNTER — Ambulatory Visit: Payer: Medicare Other | Admitting: Family Medicine

## 2015-05-12 ENCOUNTER — Ambulatory Visit (INDEPENDENT_AMBULATORY_CARE_PROVIDER_SITE_OTHER): Payer: Medicare Other | Admitting: Family Medicine

## 2015-05-12 ENCOUNTER — Encounter: Payer: Self-pay | Admitting: Family Medicine

## 2015-05-12 VITALS — BP 130/82 | HR 86 | Temp 98.2°F | Resp 14 | Ht 72.0 in | Wt 322.0 lb

## 2015-05-12 DIAGNOSIS — M6588 Other synovitis and tenosynovitis, other site: Secondary | ICD-10-CM | POA: Diagnosis not present

## 2015-05-12 DIAGNOSIS — M1711 Unilateral primary osteoarthritis, right knee: Secondary | ICD-10-CM

## 2015-05-12 DIAGNOSIS — M7752 Other enthesopathy of left foot: Secondary | ICD-10-CM

## 2015-05-12 MED ORDER — CLOPIDOGREL BISULFATE 75 MG PO TABS: 75.0000 mg | ORAL_TABLET | Freq: Every day | ORAL | Status: DC

## 2015-05-12 MED ORDER — HYDROCODONE-ACETAMINOPHEN 10-325 MG PO TABS: 1.0000 | ORAL_TABLET | Freq: Four times a day (QID) | ORAL | Status: DC | PRN

## 2015-05-12 NOTE — Patient Instructions (Addendum)
Release of records- [facility] Referral to to Dr. Lorin Mercy  Take pain medicine as prescribed Continue Icing  F/U as previous

## 2015-05-12 NOTE — Progress Notes (Signed)
Patient ID: Peter Dunlap, male   DOB: 1955/10/30, 59 y.o.   MRN: [id number]   Subjective:    Patient ID: Peter Dunlap, male    DOB: August 14, 1955, 59 y.o.   MRN: [id number]  Patient presents for L Leg Pain  patient here for new referral and pain control. He was diagnosed with tendinitis in his ankle and foot about 2 months ago. He's had persistent pain since then. He initially went to the emergency room. X-ray was done which showed small subcortical cyst otherwise no fracture. He was seen by [facility] orthopedics they recommended that he do physical therapy but then stated that he may need casting. I do not have any records but he was not pleased with the appointment and will like to see an old orthopedic surgeon that he used to see. He also has also arthritis of his right knee which is severe and was already indicated that he needed surgery on this as well. He does not have any pain medication.    Review Of Systems:  GEN- denies fatigue, fever, weight loss,weakness, recent illness HEENT- denies eye drainage, change in vision, nasal discharge, CVS- denies chest pain, palpitations RESP- denies SOB, cough, wheeze MSK- +joint pain, muscle aches, injury Neuro- denies headache, dizziness, syncope, seizure activity       Objective:    BP 130/82 mmHg  Pulse 86  Temp(Src) 98.2 F (36.8 C) (Oral)  Resp 14  Ht 6' (1.829 m)  Wt 322 lb (146.058 kg)  BMI 43.66 kg/m2 GEN- NAD, alert and oriented x3 CVS- irregular rhythem, normal rate , no murmur RESP-CTAB LLE- in walking boot        Assessment & Plan:      Problem List Items Addressed This Visit    None    Visit Diagnoses    Tendinitis of left ankle    -  Primary    Refer for second opoinion and treatment, prefers Dr. Lorin Mercy he has seen in past. Given Norco, he is on plavix avoid NSAIDS, has GI upset/rectal bleeds as well.    Primary osteoarthritis of right knee        Relevant Medications    HYDROcodone-acetaminophen (NORCO)  10-325 MG tablet       Note: This dictation was prepared with Dragon dictation along with smaller phrase technology. Any transcriptional errors that result from this process are unintentional.

## 2015-05-19 IMAGING — CR DG CHEST 1V PORT
1 series · 1 of 1 positions shown · non-contrast
Comparison: 12/02/2013

CLINICAL DATA: Shortness of breath and chest pain

EXAM:
PORTABLE CHEST - 1 VIEW

[AP]
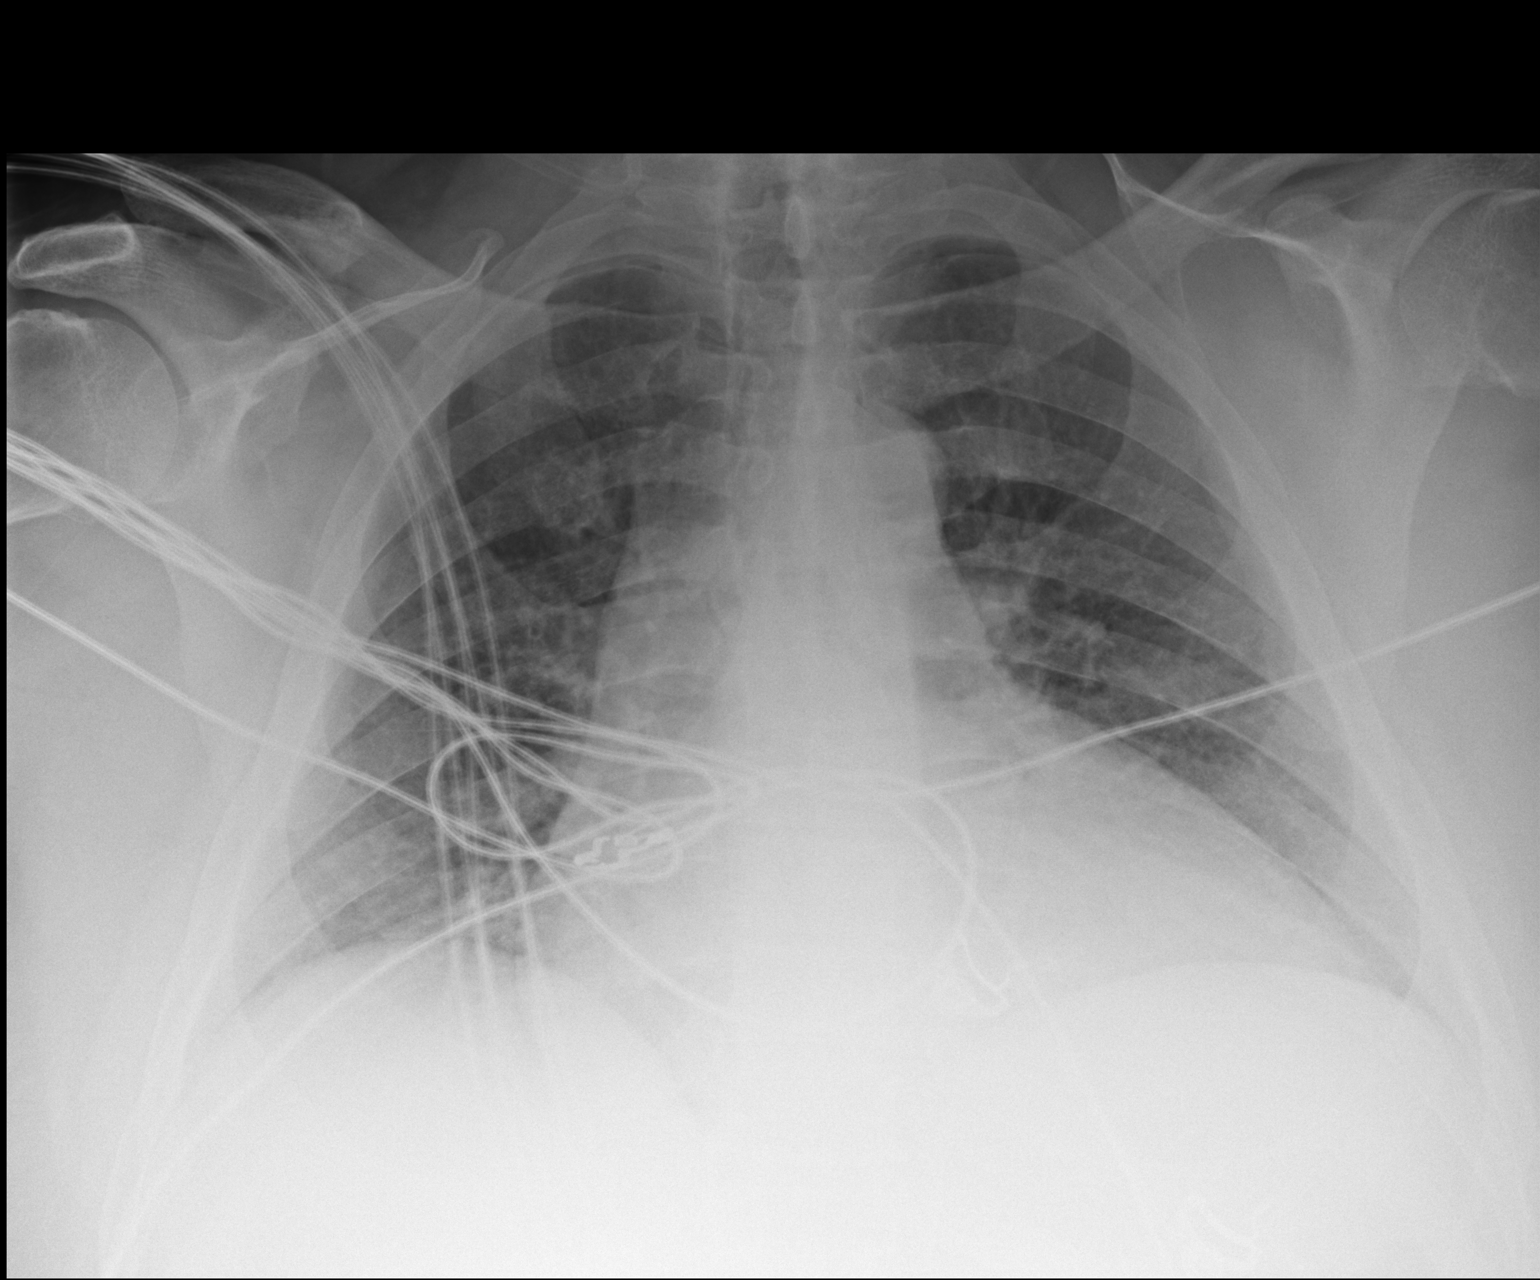

[1 of 1 positions shown; findings below may reference images not displayed]

FINDINGS: There is chronic cardiopericardial enlargement. When accounting for
hypoaeration, no edema, consolidation, effusion, or pneumothorax. No
acute osseous findings. Remote right coracoclavicular ligament
injury with heterotopic ossification.
IMPRESSION: Low volume lungs.  No evidence of edema or pneumonia.

## 2015-05-20 ENCOUNTER — Other Ambulatory Visit: Payer: Self-pay | Admitting: Internal Medicine

## 2015-06-01 ENCOUNTER — Encounter: Payer: Self-pay | Admitting: Internal Medicine

## 2015-06-09 DIAGNOSIS — M25561 Pain in right knee: Secondary | ICD-10-CM | POA: Diagnosis not present

## 2015-06-09 DIAGNOSIS — M25572 Pain in left ankle and joints of left foot: Secondary | ICD-10-CM | POA: Diagnosis not present

## 2015-06-23 DIAGNOSIS — M25572 Pain in left ankle and joints of left foot: Secondary | ICD-10-CM | POA: Diagnosis not present

## 2015-06-23 DIAGNOSIS — M25561 Pain in right knee: Secondary | ICD-10-CM | POA: Diagnosis not present

## 2015-06-26 DIAGNOSIS — E782 Mixed hyperlipidemia: Secondary | ICD-10-CM | POA: Diagnosis not present

## 2015-06-26 DIAGNOSIS — E559 Vitamin D deficiency, unspecified: Secondary | ICD-10-CM | POA: Diagnosis not present

## 2015-06-26 DIAGNOSIS — R739 Hyperglycemia, unspecified: Secondary | ICD-10-CM | POA: Diagnosis not present

## 2015-06-26 DIAGNOSIS — E032 Hypothyroidism due to medicaments and other exogenous substances: Secondary | ICD-10-CM | POA: Diagnosis not present

## 2015-06-26 LAB — HEMOGLOBIN A1C: Hemoglobin A1C: 6.1

## 2015-06-26 LAB — TSH: TSH: 34 u[IU]/mL — AB (ref <=5.90)

## 2015-07-03 IMAGING — CR DG CHEST 2V
2 series · 2 of 2 positions shown · non-contrast
Comparison: 12/07/2013

CLINICAL DATA: Left chest pain

EXAM:
CHEST  2 VIEW

[w chest lat]
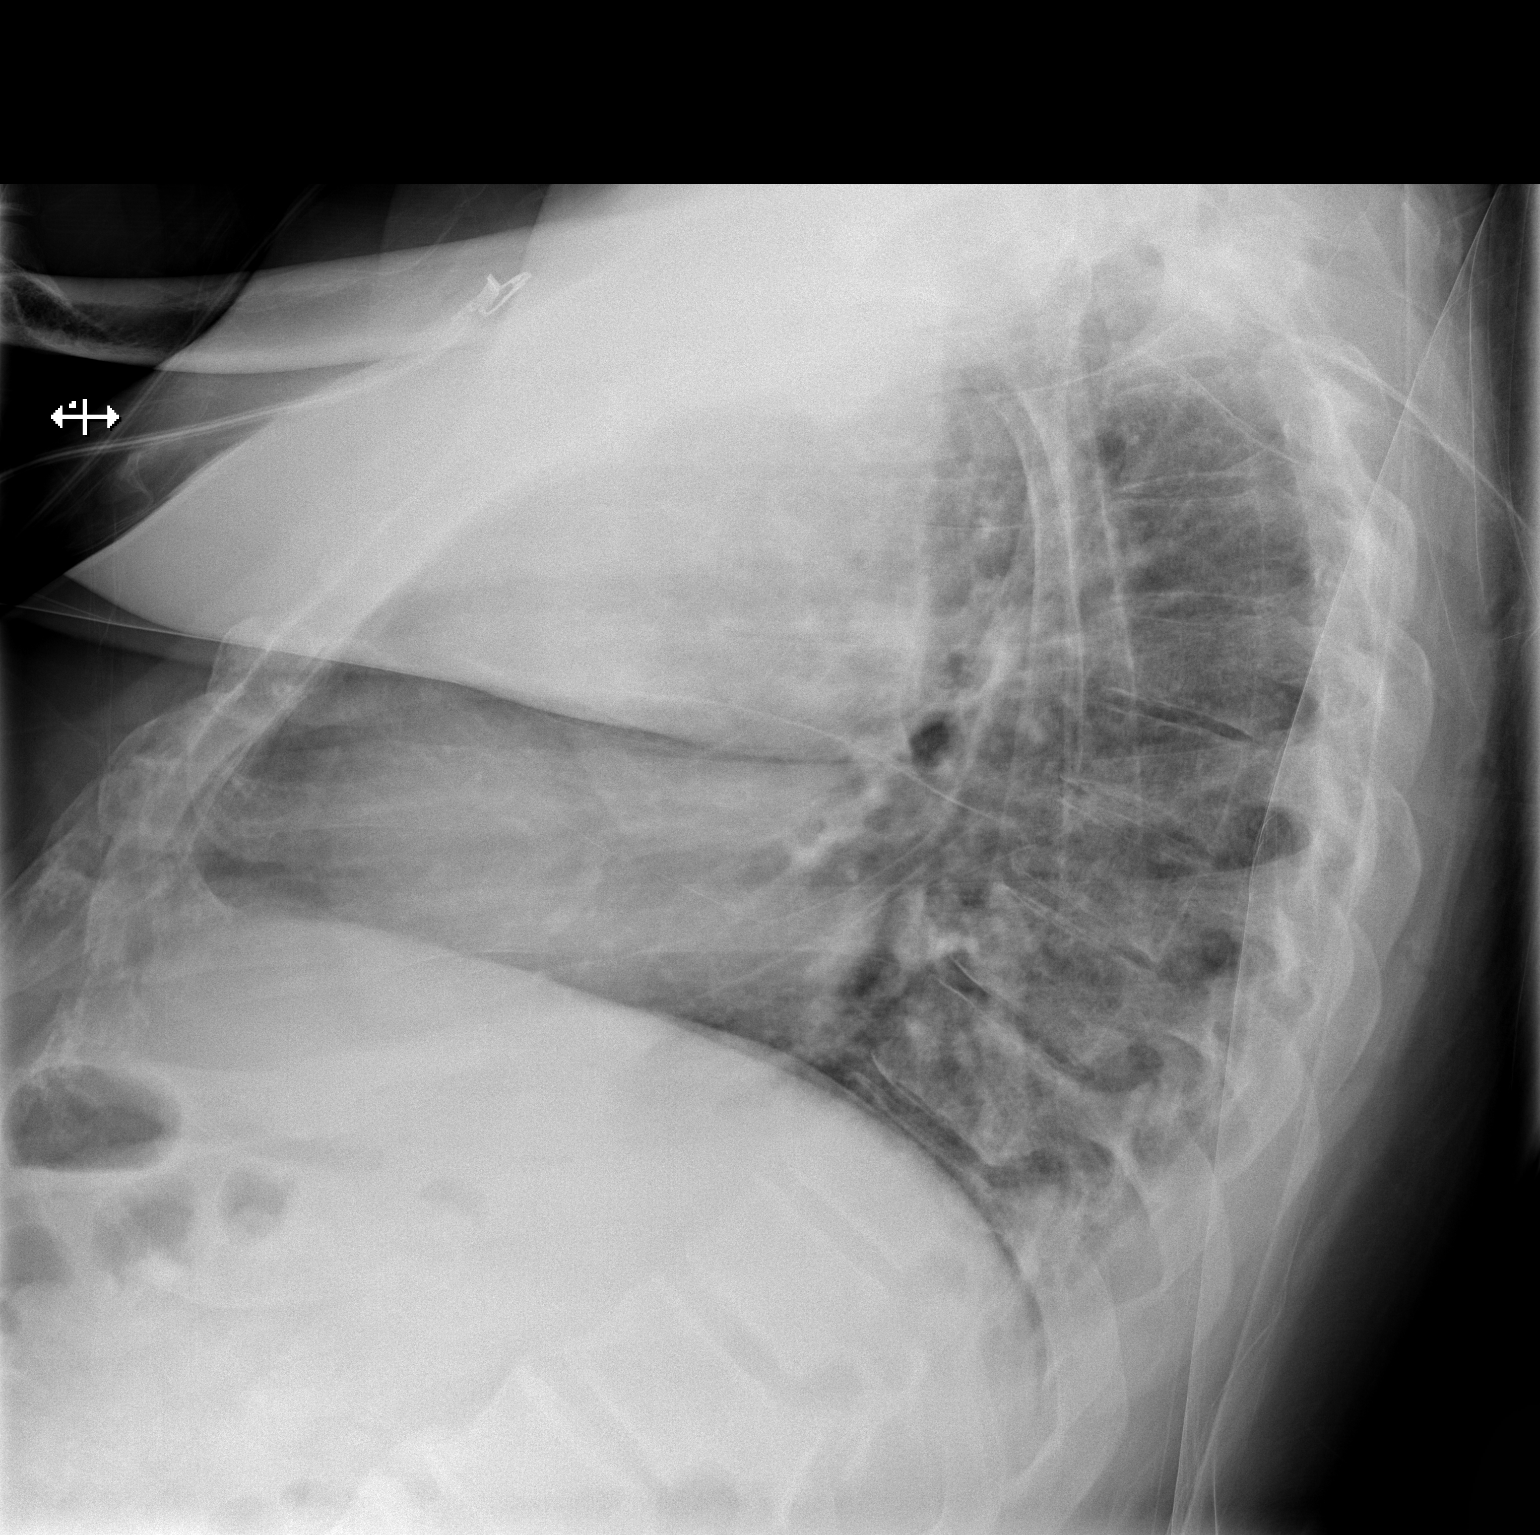

[x chest ap]
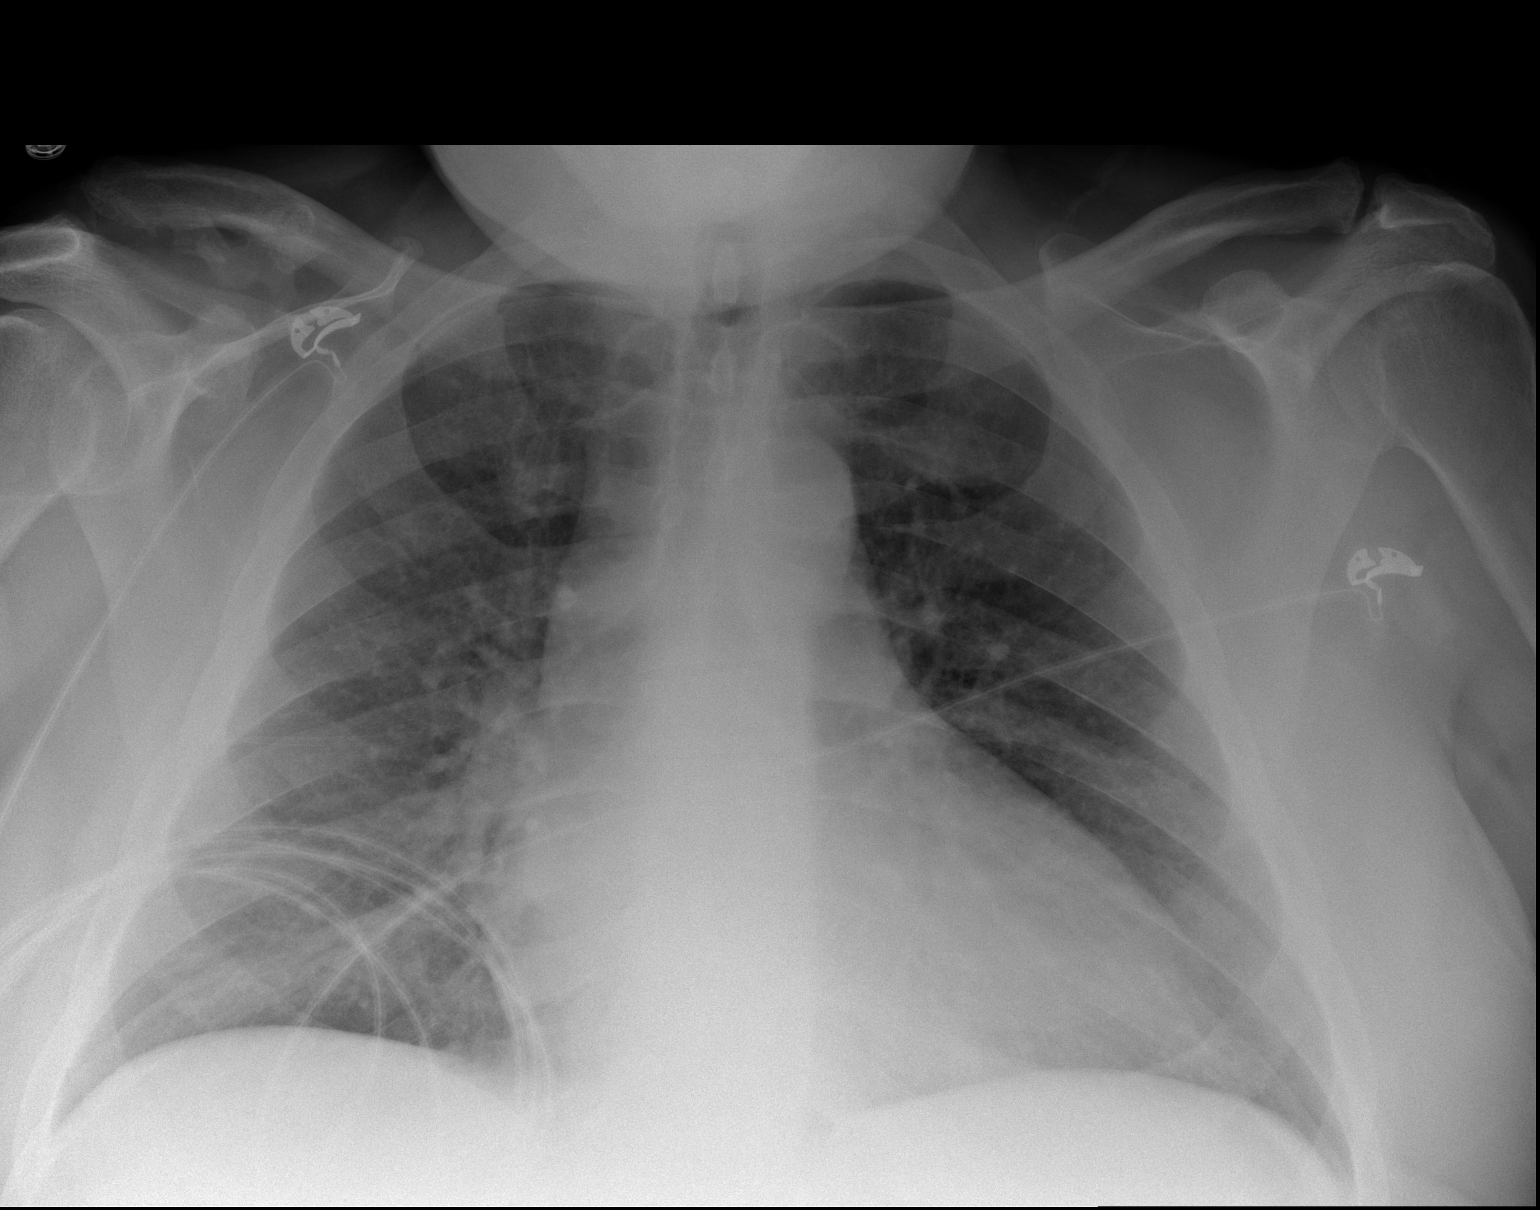

[2 of 2 positions shown; findings below may reference images not displayed]

FINDINGS: Cardiac enlargement. Negative for heart failure or pneumonia. Lungs
are clear.
IMPRESSION: No active cardiopulmonary disease.

## 2015-07-05 ENCOUNTER — Ambulatory Visit (INDEPENDENT_AMBULATORY_CARE_PROVIDER_SITE_OTHER): Payer: Medicare Other | Admitting: "Endocrinology

## 2015-07-05 ENCOUNTER — Encounter: Payer: Self-pay | Admitting: "Endocrinology

## 2015-07-05 VITALS — BP 143/86 | HR 84 | Ht 72.0 in | Wt 326.0 lb

## 2015-07-05 DIAGNOSIS — E669 Obesity, unspecified: Secondary | ICD-10-CM

## 2015-07-05 DIAGNOSIS — E032 Hypothyroidism due to medicaments and other exogenous substances: Secondary | ICD-10-CM

## 2015-07-05 DIAGNOSIS — E119 Type 2 diabetes mellitus without complications: Secondary | ICD-10-CM | POA: Diagnosis not present

## 2015-07-05 DIAGNOSIS — I10 Essential (primary) hypertension: Secondary | ICD-10-CM

## 2015-07-05 DIAGNOSIS — E1169 Type 2 diabetes mellitus with other specified complication: Secondary | ICD-10-CM

## 2015-07-05 MED ORDER — METFORMIN HCL 500 MG PO TABS
500.0000 mg | ORAL_TABLET | Freq: Two times a day (BID) | ORAL | Status: DC
Start: 2015-07-05 — End: 2015-11-02

## 2015-07-05 MED ORDER — LEVOTHYROXINE SODIUM 175 MCG PO TABS: 175.0000 ug | ORAL_TABLET | Freq: Every day | ORAL | Status: DC

## 2015-07-05 NOTE — Patient Instructions (Signed)

## 2015-07-05 NOTE — Progress Notes (Signed)
Subjective:    Patient ID: Peter Dunlap, male    DOB: September 01, 1955, PCP [name], MD   Past Medical History  Diagnosis Date  . Hypertension     Severe LVH with normal EF  . Arteriosclerotic cardiovascular disease (ASCVD) 2005    catheterization in 10/2010:50% mid LAD, diffuse distal disease, circumflex irregularities, large dominant RCA with a 50% ostial, 70% distal, 60% posterolateral and 70% PDA; normal EF  . Cerebrovascular disease 2010    R. carotid endarterectomy; Duplex in 10/2010-widely patent ICAs, subtotal left vertebral-not thought to be contributing to symptoms  . Hyperlipidemia   . Obesity   . Tobacco abuse     Quit 2014  . Benign prostatic hypertrophy   . Low back pain   . History of PSVT (paroxysmal supraventricular tachycardia) 2005    Diagnosed with ILR; no recurrence following RFA  . Cervical spine disease     CT in 2012-advanced degeneration and spondylosis with moderate spinal stenosis at C3-C6  . H/O: substance abuse     Cocaine, marijuana, alcohol.  Quit 2013.   [name] Syncope   . Gastroesophageal reflux disease   . Depression   . Erectile dysfunction   . CHF (congestive heart failure) ([place])   . H/O hiatal hernia   . Headache(784.0)   . Arthritis   . Pericardial effusion without cardiac tamponade 12/03/2013  . Non-ST elevation myocardial infarction (NSTEMI), initial episode of care [facility]) 12/02/2013    DES LAD  . Thyroid disease   . Sleep apnea     CPAP  . Tachy-brady syndrome ([place])   . Presence of permanent cardiac pacemaker 09/18/2014  . Carotid artery occlusion     left   Past Surgical History  Procedure Laterality Date  . Radiofrequency ablation  2005    for PSVT  . Coronary angioplasty with stent placement  12/03/2013    LAD 90%-->0% W/ Promus Premier DES 3.0 mm x 16 mm, CFX OK, RCA 40%, EF 70-75%  . Carotid endarterectomy Right Feb. 25, 2010     CEA  . Trudee Kuster hole Right 04/13/2014    Procedure: Haskell Flirt;  Surgeon: Charlie Pitter, MD;   Location: [facility]S;  Service: Neurosurgery;  Laterality: Right;  . Left heart catheterization with coronary angiogram Left 12/03/2013    Procedure: LEFT HEART CATHETERIZATION WITH CORONARY ANGIOGRAM;  Surgeon: Leonie Man, MD;  Location: [facility] CATH LAB;  Service: Cardiovascular;  Laterality: Left;  . Percutaneous coronary stent intervention (pci-s)  12/03/2013    Procedure: PERCUTANEOUS CORONARY STENT INTERVENTION (PCI-S);  Surgeon: Leonie Man, MD;  Location: [facility] CATH LAB;  Service: Cardiovascular;;  . Left heart catheterization with coronary angiogram N/A 01/26/2014    Procedure: LEFT HEART CATHETERIZATION WITH CORONARY ANGIOGRAM;  Surgeon: Jettie Booze, MD;  Location: [facility] CATH LAB;  Service: Cardiovascular;  Laterality: N/A;  . Left heart catheterization with coronary angiogram N/A 08/03/2014    Procedure: LEFT HEART CATHETERIZATION WITH CORONARY ANGIOGRAM;  Surgeon: Burnell Blanks, MD;  Location: [facility] CATH LAB;  Service: Cardiovascular;  Laterality: N/A;  . Permanent pacemaker insertion N/A 09/18/2014    Procedure: PERMANENT PACEMAKER INSERTION;  Surgeon: [name], MD;  Location: [facility] CATH LAB;  Service: Cardiovascular;  Laterality: N/A;  . Brain surgery  2015    hematoma evacuation   Social History   Social History  . Marital Status: Married    Spouse Name: N/A  . Number of Children: 0  . Years of Education:  N/A   Occupational History  . Retired    Social History Main Topics  . Smoking status: Former Smoker -- 1.00 packs/day for 40 years    Types: Cigarettes    Start date: 10/20/1972    Quit date: 10/10/2012  . Smokeless tobacco: Never Used     Comment: Quit in May.   . Alcohol Use: No     Comment: former drinker-- sober since 2013.   . Drug Use: No     Comment: quit cocaine 10/2011  . Sexual Activity:    Partners: Female   Other Topics Concern  . None   Social History Narrative   Lives in [place].   Outpatient Encounter Prescriptions as of  07/05/2015  Medication Sig  . clopidogrel (PLAVIX) 75 MG tablet Take 1 tablet (75 mg total) by mouth daily.  . colchicine 0.6 MG tablet Take 1 tablet (0.6 mg total) by mouth 2 (two) times daily as needed (for gout).  [name] dexlansoprazole (DEXILANT) 60 MG capsule Take 1 capsule (60 mg total) by mouth daily.  . furosemide (LASIX) 20 MG tablet TAKE 3 TABLETS BY MOUTH DAILY  . hydrocortisone 2.5 % cream Apply rectally BID. Please dispense with rectal tip.  [name] isosorbide mononitrate (IMDUR) 60 MG 24 hr tablet TAKE 1 TABLET BY MOUTH EVERY DAY  . levothyroxine (SYNTHROID, LEVOTHROID) 175 MCG tablet Take 1 tablet (175 mcg total) by mouth daily before breakfast.  . metoprolol tartrate (LOPRESSOR) 25 MG tablet Take 1 tablet (25 mg total) by mouth 2 (two) times daily.  [name] NITROSTAT 0.4 MG SL tablet PLACE 1 TABLET UNDER THE TONGUE EVERY 5 MINUTES AS NEEDED FOR CHEST PAIN  . RAPAFLO 8 MG CAPS capsule Take 1 capsule (8 mg total) by mouth daily.  . simvastatin (ZOCOR) 40 MG tablet Take 40 mg by mouth daily.   . traZODone (DESYREL) 50 MG tablet TAKE 1/2 TO 1 TABLET BY MOUTH AT BEDTIME AS NEEDED FOR SLEEP  . [DISCONTINUED] levothyroxine (SYNTHROID, LEVOTHROID) 150 MCG tablet Take 150 mcg by mouth daily before breakfast.  . HYDROcodone-acetaminophen (NORCO) 10-325 MG tablet Take 1 tablet by mouth every 6 (six) hours as needed. (Patient not taking: Reported on 07/05/2015)  . metFORMIN (GLUCOPHAGE) 500 MG tablet Take 1 tablet (500 mg total) by mouth 2 (two) times daily with a meal.  . potassium chloride SA (K-DUR,KLOR-CON) 20 MEQ tablet TAKE 1 TABLET BY MOUTH EVERY DAY   Facility-Administered Encounter Medications as of 07/05/2015  Medication  . 0.9 %  sodium chloride infusion   ALLERGIES: Allergies  Allergen Reactions  . Lactose Intolerance (Gi) Other (See Comments)    UPSET STOMACH    VACCINATION STATUS: Immunization History  Administered Date(s) Administered  . Influenza,inj,Quad PF,36+ Mos 02/17/2014,  03/09/2015  . Pneumococcal Polysaccharide-23 01/05/2014  . Tdap 12/19/2010    HPI  Mr. Demarest is a 9 - yr-old male patient with medical history as above.  He was given RAI therapy on March 16, 2014  . Unfortunately , he is not consistent in taking his levothyroxine. He was supposed to be on Lt4 150 mcg po qam.  He has regained 45 lbs of weight.  He denies cold intolerance.  Pt denies family history of thyroid dysfunction . He denies personal history of goiter. he has hx of a-fib, and CAD which required stent placement x 2.   Review of Systems  Constitutional:  +weight gain,  +fatigue, no subjective hyperthermia/hypothermia Eyes: no blurry vision, no xerophthalmia ENT: no sore throat, no nodules  palpated in throat, no dysphagia/odynophagia, no hoarseness Cardiovascular: no CP/SOB/palpitations/leg swelling Respiratory: no cough/SOB Gastrointestinal: no N/V/D/C Musculoskeletal: no muscle/joint aches Skin: no rashes Neurological: no tremors/numbness/tingling/dizziness Psychiatric: no depression/anxiety  Objective:    BP 143/86 mmHg  Pulse 84  Ht 6' (1.829 m)  Wt 326 lb (147.873 kg)  BMI 44.20 kg/m2  SpO2 95%  Wt Readings from Last 3 Encounters:  07/05/15 326 lb (147.873 kg)  05/12/15 322 lb (146.058 kg)  04/05/15 320 lb (145.151 kg)    Physical Exam Constitutional:  Reluctant affect, obese, in NAD Eyes: PERRLA, EOMI, no exophthalmos ENT: moist mucous membranes, no thyromegaly, no cervical lymphadenopathy Cardiovascular: RRR, No MRG Respiratory: CTA B Gastrointestinal: abdomen soft, NT, ND, BS+ Musculoskeletal: no deformities, strength intact in all 4 Skin: moist, warm, no rashes Neurological: no tremor with outstretched hands, DTR normal in all 4   CMP     Component Value Date/Time   NA 138 12/20/2014 0730   K 3.3* 12/20/2014 0730   CL 106 12/20/2014 0730   CO2 26 12/20/2014 0730   GLUCOSE 96 12/20/2014 0730   BUN 11 12/20/2014 0730   CREATININE 1.16  12/20/2014 0730   CREATININE 1.26 10/26/2014 0946   CALCIUM 8.8* 12/20/2014 0730   PROT 7.1 12/20/2014 0730   ALBUMIN 3.3* 12/20/2014 0730   AST 21 12/20/2014 0730   ALT 15* 12/20/2014 0730   ALKPHOS 83 12/20/2014 0730   BILITOT 1.0 12/20/2014 0730   GFRNONAA >60 12/20/2014 0730   GFRAA >60 12/20/2014 0730     Diabetic Labs (most recent): Lab Results  Component Value Date   HGBA1C 6.0* 12/20/2014   HGBA1C 6.1* 10/26/2014   HGBA1C 5.9* 07/27/2014     Lipid Panel ( most recent) Lipid Panel     Component Value Date/Time   CHOL 127 12/20/2014 0430   TRIG 84 12/20/2014 0430   HDL 33* 12/20/2014 0430   CHOLHDL 3.8 12/20/2014 0430   VLDL 17 12/20/2014 0430   LDLCALC 77 12/20/2014 0430      Assessment & Plan:   1. Hypothyroidism due to medicaments and other exogenous substances His TFTs are c/w inadequate replacement, however, he admits to inconsistent LT4 intake. I urged him to stay consistent in taking this important hormone daily. He will increase LT4 to 175 mcg po qam. He has a-flutter with triple CAD.  - We discussed about correct intake of levothyroxine, at fasting, with water, separated by at least 30 minutes from breakfast, and separated by more than 4 hours from calcium, iron, multivitamins, acid reflux medications (PPIs). -Patient is made aware of the fact that thyroid hormone replacement is needed for life, dose to be adjusted by periodic monitoring of thyroid function tests.   2. Diabetes mellitus type 2 in obese ([place]) -Controlled with A1c of 6.1%. I approached him for low-dose metformin therapy. He agrees and I would initiate metformin 500 mg by mouth twice a day.  3. Essential hypertension -Uncontrolled. He did not take his blood pressure medications this morning. I advised him to be consistent in taking his blood pressure medications.  - I advised patient to maintain close follow up with [name], MD for primary care needs. Follow up plan: Return in  about 3 months (around 10/03/2015) for underactive thyroid, follow up with pre-visit labs, high blood pressure, diabetes.  Glade Lloyd, MD Phone: [phone]  Fax: [phone]   07/05/2015, 1:35 PM

## 2015-07-06 ENCOUNTER — Encounter: Payer: Self-pay | Admitting: Internal Medicine

## 2015-07-06 ENCOUNTER — Ambulatory Visit (INDEPENDENT_AMBULATORY_CARE_PROVIDER_SITE_OTHER): Payer: Medicare Other | Admitting: Internal Medicine

## 2015-07-06 VITALS — BP 120/72 | HR 76 | Ht 72.0 in | Wt 328.8 lb

## 2015-07-06 DIAGNOSIS — I480 Paroxysmal atrial fibrillation: Secondary | ICD-10-CM | POA: Diagnosis not present

## 2015-07-06 DIAGNOSIS — I495 Sick sinus syndrome: Secondary | ICD-10-CM | POA: Diagnosis not present

## 2015-07-06 LAB — CUP PACEART INCLINIC DEVICE CHECK
Battery Remaining Longevity: 130.8
Battery Voltage: 2.99 V
Brady Statistic RA Percent Paced: 8 %
Brady Statistic RV Percent Paced: 7.6 %
Date Time Interrogation Session: 20170131083021
Implantable Lead Implant Date: 20160415
Implantable Lead Implant Date: 20160415
Implantable Lead Location: 753859
Implantable Lead Location: 753860
Lead Channel Impedance Value: 450 Ohm
Lead Channel Impedance Value: 600 Ohm
Lead Channel Pacing Threshold Amplitude: 0.5 V
Lead Channel Pacing Threshold Amplitude: 0.5 V
Lead Channel Pacing Threshold Amplitude: 0.75 V
Lead Channel Pacing Threshold Amplitude: 0.75 V
Lead Channel Pacing Threshold Pulse Width: 0.5 ms
Lead Channel Pacing Threshold Pulse Width: 0.5 ms
Lead Channel Pacing Threshold Pulse Width: 0.5 ms
Lead Channel Pacing Threshold Pulse Width: 0.5 ms
Lead Channel Sensing Intrinsic Amplitude: 12 mV
Lead Channel Sensing Intrinsic Amplitude: 5 mV
Lead Channel Setting Pacing Amplitude: 2 V
Lead Channel Setting Pacing Amplitude: 2.5 V
Lead Channel Setting Pacing Pulse Width: 0.5 ms
Lead Channel Setting Sensing Sensitivity: 2 mV
Pulse Gen Model: 2240
Pulse Gen Serial Number: 7756161

## 2015-07-06 NOTE — Progress Notes (Signed)
HPI Mr. Yazzie returns today for followup. He is a pleasant middle aged man with a h/o tobacco abuse, SVT, s/p catheter ablation, HTN, symptomatic bradycardia, s/p PPM and atrial fibrillation and CAD. His dizzy spells have resolved since his PPM was placed. His palpitations have improved. He is not on systemic anti-coag due to a h/o falls with a subdural hematoma, s/p evacuation. He has been bothered most recently by the gout. Also, he was noted to be hypothyroid and has had his does of synthroid uptitrated.  Allergies  Allergen Reactions  . Lactose Intolerance (Gi) Other (See Comments)    UPSET STOMACH      Current Outpatient Prescriptions  Medication Sig Dispense Refill  . clopidogrel (PLAVIX) 75 MG tablet Take 1 tablet (75 mg total) by mouth daily. 90 tablet 3  . dexlansoprazole (DEXILANT) 60 MG capsule Take 1 capsule (60 mg total) by mouth daily. 30 capsule 0  . furosemide (LASIX) 20 MG tablet TAKE 3 TABLETS BY MOUTH DAILY 90 tablet 6  . hydrocortisone 2.5 % cream Apply rectally BID. Please dispense with rectal tip. 30 g 0  . isosorbide mononitrate (IMDUR) 60 MG 24 hr tablet TAKE 1 TABLET BY MOUTH EVERY DAY 90 tablet 2  . levothyroxine (SYNTHROID, LEVOTHROID) 175 MCG tablet Take 1 tablet (175 mcg total) by mouth daily before breakfast. 30 tablet 3  . metFORMIN (GLUCOPHAGE) 500 MG tablet Take 1 tablet (500 mg total) by mouth 2 (two) times daily with a meal. 60 tablet 3  . metoprolol tartrate (LOPRESSOR) 25 MG tablet Take 1 tablet (25 mg total) by mouth 2 (two) times daily. 180 tablet 1  . NITROSTAT 0.4 MG SL tablet PLACE 1 TABLET UNDER THE TONGUE EVERY 5 MINUTES AS NEEDED FOR CHEST PAIN 25 tablet 6  . potassium chloride SA (K-DUR,KLOR-CON) 20 MEQ tablet TAKE 1 TABLET BY MOUTH EVERY DAY 30 tablet 5  . RAPAFLO 8 MG CAPS capsule Take 1 capsule (8 mg total) by mouth daily. 30 capsule 5  . simvastatin (ZOCOR) 40 MG tablet Take 40 mg by mouth daily.     . traZODone (DESYREL) 50 MG  tablet TAKE 1/2 TO 1 TABLET BY MOUTH AT BEDTIME AS NEEDED FOR SLEEP 30 tablet 3   No current facility-administered medications for this visit.   Facility-Administered Medications Ordered in Other Visits  Medication Dose Route Frequency Provider Last Rate Last Dose  . 0.9 %  sodium chloride infusion   Intravenous Continuous [name], MD         Past Medical History  Diagnosis Date  . Hypertension     Severe LVH with normal EF  . Arteriosclerotic cardiovascular disease (ASCVD) 2005    catheterization in 10/2010:50% mid LAD, diffuse distal disease, circumflex irregularities, large dominant RCA with a 50% ostial, 70% distal, 60% posterolateral and 70% PDA; normal EF  . Cerebrovascular disease 2010    R. carotid endarterectomy; Duplex in 10/2010-widely patent ICAs, subtotal left vertebral-not thought to be contributing to symptoms  . Hyperlipidemia   . Obesity   . Tobacco abuse     Quit 2014  . Benign prostatic hypertrophy   . Low back pain   . History of PSVT (paroxysmal supraventricular tachycardia) 2005    Diagnosed with ILR; no recurrence following RFA  . Cervical spine disease     CT in 2012-advanced degeneration and spondylosis with moderate spinal stenosis at C3-C6  . H/O: substance abuse     Cocaine, marijuana, alcohol.  Quit  2013.   . Syncope   . Gastroesophageal reflux disease   . Depression   . Erectile dysfunction   . CHF (congestive heart failure) ([place])   . H/O hiatal hernia   . Headache(784.0)   . Arthritis   . Pericardial effusion without cardiac tamponade 12/03/2013  . Non-ST elevation myocardial infarction (NSTEMI), initial episode of care [facility]) 12/02/2013    DES LAD  . Thyroid disease   . Sleep apnea     CPAP  . Tachy-brady syndrome ([place])   . Presence of permanent cardiac pacemaker 09/18/2014  . Carotid artery occlusion     left    ROS:   All systems reviewed and negative except as noted in the HPI.   Past Surgical History  Procedure Laterality  Date  . Radiofrequency ablation  2005    for PSVT  . Coronary angioplasty with stent placement  12/03/2013    LAD 90%-->0% W/ Promus Premier DES 3.0 mm x 16 mm, CFX OK, RCA 40%, EF 70-75%  . Carotid endarterectomy Right Feb. 25, 2010     CEA  . Trudee Kuster hole Right 04/13/2014    Procedure: Haskell Flirt;  Surgeon: Charlie Pitter, MD;  Location: [place] NEURO ORS;  Service: Neurosurgery;  Laterality: Right;  . Left heart catheterization with coronary angiogram Left 12/03/2013    Procedure: LEFT HEART CATHETERIZATION WITH CORONARY ANGIOGRAM;  Surgeon: Leonie Man, MD;  Location: [facility] CATH LAB;  Service: Cardiovascular;  Laterality: Left;  . Percutaneous coronary stent intervention (pci-s)  12/03/2013    Procedure: PERCUTANEOUS CORONARY STENT INTERVENTION (PCI-S);  Surgeon: Leonie Man, MD;  Location: [facility] CATH LAB;  Service: Cardiovascular;;  . Left heart catheterization with coronary angiogram N/A 01/26/2014    Procedure: LEFT HEART CATHETERIZATION WITH CORONARY ANGIOGRAM;  Surgeon: Jettie Booze, MD;  Location: [facility] CATH LAB;  Service: Cardiovascular;  Laterality: N/A;  . Left heart catheterization with coronary angiogram N/A 08/03/2014    Procedure: LEFT HEART CATHETERIZATION WITH CORONARY ANGIOGRAM;  Surgeon: Burnell Blanks, MD;  Location: [facility] CATH LAB;  Service: Cardiovascular;  Laterality: N/A;  . Permanent pacemaker insertion N/A 09/18/2014    Procedure: PERMANENT PACEMAKER INSERTION;  Surgeon: [name], MD;  Location: [facility] CATH LAB;  Service: Cardiovascular;  Laterality: N/A;  . Brain surgery  2015    hematoma evacuation     Family History  Problem Relation Age of Onset  . Hypertension Mother     Cerebrovascular disease  . Diabetes Mother   . Coronary artery disease Father 63  . Diabetes type II Father   . Hypertension Father   . Heart attack Father   . Lung cancer Paternal Uncle   . Diabetes Sister   . Hypertension Sister   . Heart attack Sister 32  . Diabetes Brother   .  Hypertension Brother      Social History   Social History  . Marital Status: Married    Spouse Name: N/A  . Number of Children: 0  . Years of Education: N/A   Occupational History  . Retired    Social History Main Topics  . Smoking status: Former Smoker -- 1.00 packs/day for 40 years    Types: Cigarettes    Start date: 10/20/1972    Quit date: 10/10/2012  . Smokeless tobacco: Never Used     Comment: Quit in May.   . Alcohol Use: No     Comment: former drinker-- sober since 2013.   . Drug Use: No  Comment: quit cocaine 10/2011  . Sexual Activity:    Partners: Female   Other Topics Concern  . Not on file   Social History Narrative   Lives in [place].     BP 120/72 mmHg  Pulse 76  Ht 6' (1.829 m)  Wt 328 lb 12.8 oz (149.143 kg)  BMI 44.58 kg/m2  Physical Exam:  stable appearing but obese, middle aged man, looks older than stated age, NAD HEENT: Unremarkable Neck:  7 cm JVD, no thyromegally Back:  No CVA tenderness Lungs:  Clear except for basilar rales HEART:  Regular rate rhythm, no murmurs, no rubs, no clicks Abd:  soft, obese, positive bowel sounds, no organomegally, no rebound, no guarding Ext:  2 plus pulses, no edema, no cyanosis, no clubbing Skin:  No rashes no nodules Neuro:  CN II through XII intact, motor grossly intact  PPM interrogation - [employer] DDD PPM working normally.     Assess/Plan: 1. PAF - he is not anticoagulated. He is out of rhythm about 5% of the time. He had a remote SDH after a fall. I will refer to consider a Watchman. 2. Symptomatic bradycardia - he is asymptomatic. His [employer] DDD PM is working normally. 3. CAD - he has had no anginal symptoms. Continue current meds 4. Obesity - he has gained weight as his thyroid was low and he has been sedentary from pain from gout. He is encouraged to increase his physical activity 5. Gout - he is on allopurinol. Controlled.   Mikle Bosworth.D.

## 2015-07-06 NOTE — Patient Instructions (Signed)
Medication Instructions:  Your physician recommends that you continue on your current medications as directed. Please refer to the Current Medication list given to you today.   Labwork: None ordered   Testing/Procedures: None ordered   Follow-Up: Your physician recommends that you schedule a follow-up appointment with Peter Marshall, NP to discuss Watchman in the next few weeks  Your physician wants you to follow-up in: 12 months with Dr Knox Saliva will receive a reminder letter in the mail two months in advance. If you don't receive a letter, please call our office to schedule the follow-up appointment.  Remote monitoring is used to monitor your ICD from home. This monitoring reduces the number of office visits required to check your device to one time per year. It allows [place] to keep an eye on the functioning of your device to ensure it is working properly. You are scheduled for a device check from home on 10/05/15. You may send your transmission at any time that day. If you have a wireless device, the transmission will be sent automatically. After your physician reviews your transmission, you will receive a postcard with your next transmission date.      Any Other Special Instructions Will Be Listed Below (If Applicable).     If you need a refill on your cardiac medications before your next appointment, please call your pharmacy.

## 2015-07-12 ENCOUNTER — Encounter: Payer: Self-pay | Admitting: "Endocrinology

## 2015-07-17 ENCOUNTER — Other Ambulatory Visit: Payer: Self-pay | Admitting: Family Medicine

## 2015-07-19 ENCOUNTER — Encounter: Payer: Self-pay | Admitting: Family Medicine

## 2015-07-19 DIAGNOSIS — G4733 Obstructive sleep apnea (adult) (pediatric): Secondary | ICD-10-CM | POA: Diagnosis not present

## 2015-07-19 NOTE — Telephone Encounter (Signed)
Medication refill for one time only.  Patient needs to be seen.  Letter sent for patient to call and schedule 

## 2015-07-20 ENCOUNTER — Encounter: Payer: Self-pay | Admitting: Nurse Practitioner

## 2015-07-20 ENCOUNTER — Other Ambulatory Visit: Payer: Self-pay

## 2015-07-20 NOTE — Progress Notes (Signed)
Watchman Consult Note   Date:  07/21/2015   ID:  Peter Dunlap, DOB [birth year], MRN [id number]  PCP:  [name], MD  Primary Electrophysiologist: Lovena Le Referring Physician: Lovena Le   CC: to discuss Watchman implant    History of Present Illness: Peter Dunlap is a 60 y.o. male who presents today for evaluation of left atrial appendage occluder.  He has paroxysmal atrial fibrillation as well as tachy brady syndrome s/p PPM, morbid obesity, hypertension, and coronary artery disease.  The patient has been evaluated by their referring physician and is felt to be a poor candidate for long term [place] due to prior SDH after a fall.  He therefore presents today for Watchman evaluation.   Echo 12/2014 demonstrated EF 60-65%, no RWMA, grade 2 diastolic dysfunction, small to moderate pericardial effusion, LA 46.   Cardiac catheterization 07/2014 demonstrated stable triple vessel CAD with patent stents to LAD and circumflex with no focal targets for PCI. Last intervention 01/2014 with DES to left circumflex.  He is compliant with CPAP and has had weight gain after thyroidectomy.  He is working on diet and exercise but is limited by knee pain. He has occasional non-exertional chest pain that is clearly different than prior chest pain with stents.  He also has occasional headaches that are very different from prior SDH headaches, come and go. He most notices his AF at night with palpitations.   Today, he denies symptoms of shortness of breath, orthopnea, PND, lower extremity edema, claudication, dizziness, presyncope, syncope, bleeding, or neurologic sequela. The patient is tolerating medications without difficulties and is otherwise without complaint today.    Past Medical History  Diagnosis Date  . Hypertension   . Arteriosclerotic cardiovascular disease (ASCVD) 2005    catheterization in 10/2010:50% mid LAD, diffuse distal disease, circumflex irregularities, large dominant RCA with a 50%  ostial, 70% distal, 60% posterolateral and 70% PDA; normal EF  . Cerebrovascular disease 2010    R. carotid endarterectomy; Duplex in 10/2010-widely patent ICAs, subtotal left vertebral-not thought to be contributing to symptoms  . Hyperlipidemia   . Obesity   . Tobacco abuse     Quit 2014  . Benign prostatic hypertrophy   . Cervical spine disease     CT in 2012-advanced degeneration and spondylosis with moderate spinal stenosis at C3-C6  . H/O: substance abuse     Cocaine, marijuana, alcohol.  Quit 2013.   [name] Gastroesophageal reflux disease   . Depression   . Erectile dysfunction   . H/O hiatal hernia   . Arthritis   . Non-ST elevation myocardial infarction (NSTEMI), initial episode of care [facility]) 12/02/2013    DES LAD  . Thyroid disease   . Sleep apnea     CPAP  . Tachy-brady syndrome ([place])     a. s/p STJ dual chamber PPM    Past Surgical History  Procedure Laterality Date  . Radiofrequency ablation  2005    for PSVT  . Coronary angioplasty with stent placement  12/03/2013    LAD 90%-->0% W/ Promus Premier DES 3.0 mm x 16 mm, CFX OK, RCA 40%, EF 70-75%  . Carotid endarterectomy Right Feb. 25, 2010     CEA  . Trudee Kuster hole Right 04/13/2014    Procedure: Haskell Flirt;  Surgeon: Charlie Pitter, MD;  Location: [facility] NEURO ORS;  Service: Neurosurgery;  Laterality: Right;  . Left heart catheterization with coronary angiogram Left 12/03/2013    Procedure: LEFT HEART CATHETERIZATION WITH CORONARY ANGIOGRAM;  Surgeon: Leonie Man, MD;  Location[facility] CATH LAB;  Service: Cardiovascular;  Laterality: Left;  . Percutaneous coronary stent intervention (pci-s)  12/03/2013    Procedure: PERCUTANEOUS CORONARY STENT INTERVENTION (PCI-S);  Surgeon: Leonie Man, MD;  Location: [facility] CATH LAB;  Service: Cardiovascular;;  . Left heart catheterization with coronary angiogram N/A 01/26/2014    Procedure: LEFT HEART CATHETERIZATION WITH CORONARY ANGIOGRAM;  Surgeon: Jettie Booze, MD;  Location: [facility] CATH LAB;   Service: Cardiovascular;  Laterality: N/A;  . Left heart catheterization with coronary angiogram N/A 08/03/2014    Procedure: LEFT HEART CATHETERIZATION WITH CORONARY ANGIOGRAM;  Surgeon: Burnell Blanks, MD;  Location: [facility] CATH LAB;  Service: Cardiovascular;  Laterality: N/A;  . Permanent pacemaker insertion N/A 09/18/2014    Procedure: PERMANENT PACEMAKER INSERTION;  Surgeon: [name], MD;  Location: [facility] CATH LAB;  Service: Cardiovascular;  Laterality: N/A;  . Brain surgery  2015    hematoma evacuation     Current Outpatient Prescriptions  Medication Sig Dispense Refill  . clopidogrel (PLAVIX) 75 MG tablet Take 1 tablet (75 mg total) by mouth daily. 90 tablet 3  . dexlansoprazole (DEXILANT) 60 MG capsule Take 1 capsule (60 mg total) by mouth daily. 30 capsule 0  . furosemide (LASIX) 20 MG tablet TAKE 3 TABLETS BY MOUTH DAILY 90 tablet 6  . hydrocortisone 2.5 % cream Apply rectally BID. Please dispense with rectal tip. 30 g 0  . isosorbide mononitrate (IMDUR) 60 MG 24 hr tablet TAKE 1 TABLET BY MOUTH EVERY DAY 90 tablet 2  . levothyroxine (SYNTHROID, LEVOTHROID) 175 MCG tablet Take 1 tablet (175 mcg total) by mouth daily before breakfast. 30 tablet 3  . metFORMIN (GLUCOPHAGE) 500 MG tablet Take 1 tablet (500 mg total) by mouth 2 (two) times daily with a meal. 60 tablet 3  . metoprolol tartrate (LOPRESSOR) 25 MG tablet Take 1 tablet (25 mg total) by mouth 2 (two) times daily. 180 tablet 1  . nitroGLYCERIN (NITROSTAT) 0.4 MG SL tablet Place 0.4 mg under the tongue every 5 (five) minutes as needed for chest pain (x 3 doses).    . potassium chloride SA (K-DUR,KLOR-CON) 20 MEQ tablet TAKE 1 TABLET BY MOUTH EVERY DAY 30 tablet 5  . RAPAFLO 8 MG CAPS capsule TAKE 1 CAPSULE(8 MG) BY MOUTH DAILY 30 capsule 0  . simvastatin (ZOCOR) 40 MG tablet Take 40 mg by mouth daily.     . traZODone (DESYREL) 50 MG tablet TAKE 1/2 TO 1 TABLET BY MOUTH AT BEDTIME AS NEEDED FOR SLEEP 30 tablet 3   No  current facility-administered medications for this visit.   Facility-Administered Medications Ordered in Other Visits  Medication Dose Route Frequency Provider Last Rate Last Dose  . 0.9 %  sodium chloride infusion   Intravenous Continuous [name], MD        Allergies:   Lactose intolerance (gi)   Social History:  The patient  reports that he quit smoking about 2 years ago. His smoking use included Cigarettes. He started smoking about 42 years ago. He has a 40 pack-year smoking history. He has never used smokeless tobacco. He reports that he does not drink alcohol or use illicit drugs.   Family History:  The patient's family history includes Coronary artery disease (age of onset: 20) in his father; Diabetes in his brother, mother, and sister; Diabetes type II in his father; Heart attack in his father; Heart attack (age of onset: 44) in his sister; Hypertension  in his brother, father, mother, and sister; Lung cancer in his paternal uncle.    ROS:  Please see the history of present illness.   All other systems are reviewed and negative.    PHYSICAL EXAM: VS:  BP 116/78 mmHg  Pulse 80  Ht 6' (1.829 m)  Wt 323 lb 6.4 oz (146.693 kg)  BMI 43.85 kg/m2 , BMI Body mass index is 43.85 kg/(m^2). GEN: Obese, well developed, in no acute distress HEENT: normal Neck: no JVD, carotid bruits, or masses Cardiac: RRR; no murmurs, rubs, or gallops,no edema  Respiratory:  clear to auscultation bilaterally, normal work of breathing GI: soft, nontender, nondistended, + BS MS: no deformity or atrophy Skin: warm and dry  Neuro:  Strength and sensation are intact Psych: euthymic mood, full affect  EKG:  EKG is not ordered today.  Recent Labs: 12/19/2014: B Natriuretic Peptide 77.2 12/20/2014: ALT 15*; BUN 11; Creatinine, Ser 1.16; Magnesium 1.7; Potassium 3.3*; Sodium 138 03/23/2015: Hemoglobin 16.1; Platelets 148* 06/26/2015: TSH 34.00*    Lipid Panel     Component Value Date/Time   CHOL  127 12/20/2014 0430   TRIG 84 12/20/2014 0430   HDL 33* 12/20/2014 0430   CHOLHDL 3.8 12/20/2014 0430   VLDL 17 12/20/2014 0430   LDLCALC 77 12/20/2014 0430     Wt Readings from Last 3 Encounters:  07/21/15 323 lb 6.4 oz (146.693 kg)  07/06/15 328 lb 12.8 oz (149.143 kg)  07/05/15 326 lb (147.873 kg)      Other studies Reviewed: Additional studies/ records that were reviewed today include: Dr Tanna Furry office notes, echo, cath reports   ASSESSMENT AND PLAN:  1.  Paroxysmal atrial fibrillation Burden by device interrogation today 16% I have seen Peter Dunlap is a 60 y.o. male in the office today who has been referred by Dr Lovena Le for a Watchman left atrial appendage closure device.  He has a history of paroxysmal atrial fibrillation.  This patients CHA2DS2-VASc Score is equal to 4 which necessitates long term oral anticoagulation to prevent stroke. HasBled score is 3.  Modified Rankin Score is 1. Unfortunately, He is not felt to be a long term Warfarin candidate secondary to prior SDH.  The patients chart has been reviewed and I along with their referring cardiologist feel that they would be a candidate for short term oral anticoagulation.  Procedural risks for the Watchman implant have been reviewed with the patient including a 1% risk of stroke, 2% risk of perforation, 0.1% risk of device embolization.  Given the patient's poor candidacy for long-term oral anticoagulation, ability to tolerate short term oral anticoagulation, I have recommended the watchman left atrial appendage closure system.  TEE will be scheduled to review LAA anatomy.  The patient understands that the ability to implant Watchman is dependent on results of the TEE.  If patient is candidate for Watchman based on TEE results, we will schedule the procedure at the next available time.  Risks, benefits of TEE reviewed with patient today. He is s/p thyroid iodine treatment with tablets, he has not had direct radiation.   Discussed with Dr Stanford Breed today - ok to proceed.  I will call his neurosurgeon to be sure he thinks that short term Warfarin and ASA followed by ASA/Plavix is ok with prior SDH.  2.  Morbid obesity Weight loss advised  3.  HTN Stable No change required today  4.  CAD No recent ischemic symptoms Last DES placed 01/2014 Plan will be to discontinue Plavix  at time of Watchman implant, start Warfarin and ASA, at 6 weeks resume Plavix and ASA then discontinue Plavix at 6 months post implant  5.  OSA Compliance with CPAP advised    Follow-up:  With EP NP following Watchman implant   Current medicines are reviewed at length with the patient today.   The patient does not have concerns regarding his medicines.  The following changes were made today:  none  Labs/ tests ordered today include: pre-procedure labs, TEE   Army Fossa MD 07/21/2015 9:08 AM     [facility] [address] [place] [address] [phone] (office) [phone] (fax)

## 2015-07-20 NOTE — Patient Outreach (Signed)
[facility]) Care Management  07/20/2015  ERYK CHESLOCK 10-06-1955 [id number]   Referral Date: 07-15-15 Referral Source: [facility] Referral Reason: High ED Utilization Outreach Attempt: First Attempt Successful.  Spoke with patient. Social: Patient lives with wife and family. Patient reports he has aide services 2 hours per day to help with activities of daily living.      Conditions: Patient has been newly diagnosed with borderline diabetes.  Recently put on metformin by Dr. Dorris Fetch. Most recent blood sugar was 106.  Patient has not had formal education on diabetes.  Patient also admits to heart failure, atrial fibrillation, gout, and coronary heart disease.  Patient reports that he weighs daily and last weight was 326 lbs.    Patient also admits to some problems with pain from his gout and sees Dr. Lorin Mercy.  Patient reports he is currently on taper dose of prednisone.   Medications:  Patient reports that his family puts his medication in his pill box for him but he keeps a list and was able to spell medications as he admits he does not read well. Patient able to afford medications.   Plan:  Patient will remain with health coach for disease management and support of diabetes.   RN Health Coach will send welcome letter, consent packet, and education information on diabetes.   RN Health Coach will contact patient/caregiver within one month and patient/caregiver agrees to next outreach.      [name], RN, MSN [place] [phone]

## 2015-07-21 ENCOUNTER — Ambulatory Visit (INDEPENDENT_AMBULATORY_CARE_PROVIDER_SITE_OTHER): Payer: Medicare Other | Admitting: Family Medicine

## 2015-07-21 ENCOUNTER — Encounter: Payer: Self-pay | Admitting: Nurse Practitioner

## 2015-07-21 ENCOUNTER — Encounter: Payer: Self-pay | Admitting: *Deleted

## 2015-07-21 ENCOUNTER — Other Ambulatory Visit: Payer: Self-pay | Admitting: Nurse Practitioner

## 2015-07-21 ENCOUNTER — Encounter: Payer: Self-pay | Admitting: Family Medicine

## 2015-07-21 ENCOUNTER — Ambulatory Visit (INDEPENDENT_AMBULATORY_CARE_PROVIDER_SITE_OTHER): Payer: Medicare Other | Admitting: Internal Medicine

## 2015-07-21 VITALS — BP 116/78 | HR 80 | Ht 72.0 in | Wt 323.4 lb

## 2015-07-21 VITALS — BP 136/80 | HR 82 | Temp 98.7°F | Resp 16 | Ht 72.0 in | Wt 322.0 lb

## 2015-07-21 DIAGNOSIS — I10 Essential (primary) hypertension: Secondary | ICD-10-CM | POA: Diagnosis not present

## 2015-07-21 DIAGNOSIS — I480 Paroxysmal atrial fibrillation: Secondary | ICD-10-CM | POA: Diagnosis not present

## 2015-07-21 DIAGNOSIS — Z9889 Other specified postprocedural states: Secondary | ICD-10-CM

## 2015-07-21 DIAGNOSIS — G4733 Obstructive sleep apnea (adult) (pediatric): Secondary | ICD-10-CM

## 2015-07-21 DIAGNOSIS — M1711 Unilateral primary osteoarthritis, right knee: Secondary | ICD-10-CM | POA: Diagnosis not present

## 2015-07-21 DIAGNOSIS — Z8679 Personal history of other diseases of the circulatory system: Secondary | ICD-10-CM

## 2015-07-21 DIAGNOSIS — Z9989 Dependence on other enabling machines and devices: Secondary | ICD-10-CM

## 2015-07-21 DIAGNOSIS — E669 Obesity, unspecified: Secondary | ICD-10-CM

## 2015-07-21 LAB — CBC
HCT: 49 % (ref 39.0–52.0)
Hemoglobin: 17.5 g/dL — ABNORMAL HIGH (ref 13.0–17.0)
MCH: 34.2 pg — ABNORMAL HIGH (ref 26.0–34.0)
MCHC: 35.7 g/dL (ref 30.0–36.0)
MCV: 95.7 fL (ref 78.0–100.0)
MPV: 10.9 fL (ref 8.6–12.4)
Platelets: 189 10*3/uL (ref 150–400)
RBC: 5.12 MIL/uL (ref 4.22–5.81)
RDW: 13.8 % (ref 11.5–15.5)
WBC: 7.4 10*3/uL (ref 4.0–10.5)

## 2015-07-21 LAB — BASIC METABOLIC PANEL
BUN: 11 mg/dL (ref 7–25)
CO2: 24 mmol/L (ref 20–31)
Calcium: 9.3 mg/dL (ref 8.6–10.3)
Chloride: 103 mmol/L (ref 98–110)
Creat: 1.37 mg/dL — ABNORMAL HIGH (ref 0.70–1.33)
Glucose, Bld: 116 mg/dL — ABNORMAL HIGH (ref 65–99)
Potassium: 4.2 mmol/L (ref 3.5–5.3)
Sodium: 139 mmol/L (ref 135–146)

## 2015-07-21 LAB — PROTIME-INR
INR: 1.21 (ref <1.50)
Prothrombin Time: 15.5 seconds — ABNORMAL HIGH (ref 11.6–15.2)

## 2015-07-21 MED ORDER — HYDROCODONE-ACETAMINOPHEN 7.5-325 MG PO TABS: 1.0000 | ORAL_TABLET | Freq: Four times a day (QID) | ORAL | Status: DC | PRN

## 2015-07-21 NOTE — Patient Instructions (Addendum)
Medication Instructions:   Your physician recommends that you continue on your current medications as directed. Please refer to the Current Medication list given to you today.   If you need a refill on your cardiac medications before your next appointment, please call your pharmacy.  Labwork:  BMET CBC PT/INR   Testing/Procedures:   NONE ORDER TODAY    Follow-Up:  WILL BE CONTACTED ABOUT FOLLOW UP   Remote monitoring is used to monitor your Pacemaker of ICD from home. This monitoring reduces the number of office visits required to check your device to one time per year. It allows [place] to keep an eye on the functioning of your device to ensure it is working properly. You are scheduled for a device check from home on . 10/18/15..You may send your transmission at any time that day. If you have a wireless device, the transmission will be sent automatically. After your physician reviews your transmission, you will receive a postcard with your next transmission date.      Any Other Special Instructions Will Be Listed Below (If Applicable).

## 2015-07-21 NOTE — Progress Notes (Signed)
Patient ID: Peter Dunlap, male   DOB: 08/11/1955, 60 y.o.   MRN: [id number]   Subjective:    Patient ID: Peter Dunlap, male    DOB: 03-19-1956, 60 y.o.   MRN: [id number]  Patient presents for Pain  issue here for pain medication. 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 He's been seen by 2 different orthopedics. His second opinion did not recommend any surgical intervention but he was given shots but they did not last long. He was not prescribed any pain medication. He wanted know if he should follow-up with orthopedics since there is nothing further that they could do.  He also one of my opinion on a new device called a watchman which she spoke with his cardiologist about this morning. He is high-risk for being on anticoagulation because of previous subdural  Hemorrhage and this advice supposed to reduce his risk of stroke as he does have atrial fibrillation.    Review Of Systems:  GEN- denies fatigue, fever, weight loss,weakness, recent illness HEENT- denies eye drainage, change in vision, nasal discharge, CVS- denies chest pain, palpitations RESP- denies SOB, cough, wheeze ABD- denies N/V, change in stools, abd pain GU- denies dysuria, hematuria, dribbling, incontinence MSK-+joint pain, muscle aches, injury Neuro- denies headache, dizziness, syncope, seizure activity       Objective:    BP 136/80 mmHg  Pulse 82  Temp(Src) 98.7 F (37.1 C) (Oral)  Resp 16  Ht 6' (1.829 m)  Wt 322 lb (146.058 kg)  BMI 43.66 kg/m2 GEN- NAD, alert and oriented x3 CVS- irregular rhythm, normal rate, 2/6 SEM RESP-CTAB        Assessment & Plan:    Approx 15 minutes spent with pt discussing cardiology device/recommendations   Problem List Items Addressed This Visit    OA (osteoarthritis) of knee     I refilled his hydrocodone advised them to follow up again with orthopedics to see if there is anything else that can be done he is been  walking boots, has had injections if not he will be on chronic pain medication      Relevant Medications   allopurinol (ZYLOPRIM) 100 MG tablet   HYDROcodone-acetaminophen (NORCO) 7.5-325 MG tablet   Atrial fibrillation ([place]) - Primary     I discussed with him that I agree that he is too high risk for anticoagulation. I do not know any specifics about this watchman device the base on the brochures and what the cardiologist states it is a good option for him.         Note: This dictation was prepared with Dragon dictation along with smaller phrase technology. Any transcriptional errors that result from this process are unintentional.

## 2015-07-21 NOTE — Assessment & Plan Note (Signed)
I discussed with him that I agree that he is too high risk for anticoagulation. I do not know any specifics about this watchman device the base on the brochures and what the cardiologist states it is a good option for him.

## 2015-07-21 NOTE — Assessment & Plan Note (Signed)
I refilled his hydrocodone advised them to follow up again with orthopedics to see if there is anything else that can be done he is been walking boots, has had injections if not he will be on chronic pain medication

## 2015-07-21 NOTE — Patient Instructions (Signed)
Take pain medication as needed Continue current medications F/U 4 months

## 2015-07-23 ENCOUNTER — Other Ambulatory Visit: Payer: Self-pay | Admitting: Family Medicine

## 2015-07-23 ENCOUNTER — Telehealth: Payer: Self-pay | Admitting: *Deleted

## 2015-07-23 NOTE — Telephone Encounter (Signed)
-----   Message from Patsey Berthold, NP sent at 07/21/2015  7:26 PM EST ----- Please notify patient of stable labs

## 2015-07-26 ENCOUNTER — Other Ambulatory Visit: Payer: Self-pay | Admitting: Family Medicine

## 2015-07-26 NOTE — Telephone Encounter (Signed)
Refill appropriate and filled per protocol. 

## 2015-07-27 ENCOUNTER — Ambulatory Visit (HOSPITAL_BASED_OUTPATIENT_CLINIC_OR_DEPARTMENT_OTHER)
Admission: RE | Admit: 2015-07-27 | Discharge: 2015-07-27 | Disposition: A | Discharge status: Home / Self Care | Payer: Medicare Other | Source: Ambulatory Visit | Attending: Nurse Practitioner | Admitting: Nurse Practitioner

## 2015-07-27 ENCOUNTER — Encounter (HOSPITAL_COMMUNITY): Payer: Self-pay

## 2015-07-27 ENCOUNTER — Ambulatory Visit (HOSPITAL_COMMUNITY)
Admission: RE | Admit: 2015-07-27 | Discharge: 2015-07-27 | Disposition: A | Discharge status: Home / Self Care | Payer: Medicare Other | Source: Ambulatory Visit | Attending: Cardiology | Admitting: Cardiology

## 2015-07-27 ENCOUNTER — Encounter (HOSPITAL_COMMUNITY)
Admission: RE | Disposition: A | Discharge status: Home / Self Care | Payer: Self-pay | Source: Ambulatory Visit | Attending: Cardiology

## 2015-07-27 DIAGNOSIS — E785 Hyperlipidemia, unspecified: Secondary | ICD-10-CM | POA: Insufficient documentation

## 2015-07-27 DIAGNOSIS — Z950 Presence of cardiac pacemaker: Secondary | ICD-10-CM | POA: Insufficient documentation

## 2015-07-27 DIAGNOSIS — F329 Major depressive disorder, single episode, unspecified: Secondary | ICD-10-CM | POA: Insufficient documentation

## 2015-07-27 DIAGNOSIS — G4733 Obstructive sleep apnea (adult) (pediatric): Secondary | ICD-10-CM | POA: Insufficient documentation

## 2015-07-27 DIAGNOSIS — I480 Paroxysmal atrial fibrillation: Secondary | ICD-10-CM | POA: Insufficient documentation

## 2015-07-27 DIAGNOSIS — N400 Enlarged prostate without lower urinary tract symptoms: Secondary | ICD-10-CM | POA: Diagnosis not present

## 2015-07-27 DIAGNOSIS — I4891 Unspecified atrial fibrillation: Secondary | ICD-10-CM | POA: Diagnosis present

## 2015-07-27 DIAGNOSIS — I313 Pericardial effusion (noninflammatory): Secondary | ICD-10-CM

## 2015-07-27 DIAGNOSIS — Z7902 Long term (current) use of antithrombotics/antiplatelets: Secondary | ICD-10-CM | POA: Insufficient documentation

## 2015-07-27 DIAGNOSIS — K219 Gastro-esophageal reflux disease without esophagitis: Secondary | ICD-10-CM | POA: Insufficient documentation

## 2015-07-27 DIAGNOSIS — Z79899 Other long term (current) drug therapy: Secondary | ICD-10-CM | POA: Diagnosis not present

## 2015-07-27 DIAGNOSIS — Z6841 Body Mass Index (BMI) 40.0 and over, adult: Secondary | ICD-10-CM | POA: Diagnosis not present

## 2015-07-27 DIAGNOSIS — Z7984 Long term (current) use of oral hypoglycemic drugs: Secondary | ICD-10-CM | POA: Insufficient documentation

## 2015-07-27 DIAGNOSIS — Z87891 Personal history of nicotine dependence: Secondary | ICD-10-CM | POA: Insufficient documentation

## 2015-07-27 DIAGNOSIS — I350 Nonrheumatic aortic (valve) stenosis: Secondary | ICD-10-CM | POA: Diagnosis not present

## 2015-07-27 DIAGNOSIS — I2510 Atherosclerotic heart disease of native coronary artery without angina pectoris: Secondary | ICD-10-CM | POA: Insufficient documentation

## 2015-07-27 DIAGNOSIS — I10 Essential (primary) hypertension: Secondary | ICD-10-CM | POA: Diagnosis not present

## 2015-07-27 DIAGNOSIS — I495 Sick sinus syndrome: Secondary | ICD-10-CM | POA: Diagnosis not present

## 2015-07-27 DIAGNOSIS — I252 Old myocardial infarction: Secondary | ICD-10-CM | POA: Diagnosis not present

## 2015-07-27 HISTORY — PX: TEE WITHOUT CARDIOVERSION: SHX5443

## 2015-07-27 LAB — GLUCOSE, CAPILLARY: Glucose-Capillary: 94 mg/dL (ref 65–99)

## 2015-07-27 SURGERY — ECHOCARDIOGRAM, TRANSESOPHAGEAL
Anesthesia: Moderate Sedation

## 2015-07-27 MED ORDER — MIDAZOLAM HCL 5 MG/ML IJ SOLN
INTRAMUSCULAR | Status: AC
  Filled 2015-07-27: qty 2

## 2015-07-27 MED ORDER — DIPHENHYDRAMINE HCL 50 MG/ML IJ SOLN
INTRAMUSCULAR | Status: AC
  Filled 2015-07-27: qty 1

## 2015-07-27 MED ORDER — MIDAZOLAM HCL 10 MG/2ML IJ SOLN
INTRAMUSCULAR | Status: DC | PRN
  Administered 2015-07-27 (×4): 2 mg via INTRAVENOUS

## 2015-07-27 MED ORDER — FENTANYL CITRATE (PF) 100 MCG/2ML IJ SOLN
INTRAMUSCULAR | Status: DC | PRN
  Administered 2015-07-27 (×3): 25 ug via INTRAVENOUS

## 2015-07-27 MED ORDER — BUTAMBEN-TETRACAINE-BENZOCAINE 2-2-14 % EX AERO
INHALATION_SPRAY | CUTANEOUS | Status: DC | PRN
  Administered 2015-07-27: 2 via TOPICAL

## 2015-07-27 MED ORDER — SODIUM CHLORIDE 0.9 % IV SOLN
INTRAVENOUS | Status: DC
  Administered 2015-07-27: 08:00:00 via INTRAVENOUS

## 2015-07-27 MED ORDER — FENTANYL CITRATE (PF) 100 MCG/2ML IJ SOLN
INTRAMUSCULAR | Status: AC
  Filled 2015-07-27: qty 2

## 2015-07-27 NOTE — Progress Notes (Signed)
  Echocardiogram Echocardiogram Transesophageal has been performed.  Peter Dunlap 07/27/2015, 10:10 AM

## 2015-07-27 NOTE — CV Procedure (Signed)
See full TEE report in [employer].  [name]  

## 2015-07-27 NOTE — H&P (Signed)
Peter Dunlap  07/21/2015 8:00 AM  Office Visit  MRN:  [id number]   Description: Male DOB: 03/17/1956  Provider: [name], MD  Department: [address] [facility]       Vital Signs  Most recent update: 07/21/2015 7:54 AM by Lynelle Smoke T Wysor    BP Pulse Ht Wt BMI    116/78 mmHg 80 6' (1.829 m) 323 lb 6.4 oz (146.693 kg) 43.85 kg/m2    Vitals History     Progress Notes      [name], MD at 07/20/2015 8:03 AM     Status: Signed       Expand All Collapse All      Watchman Consult Note   Date: 07/21/2015   ID: Peter Dunlap, DOB 1955/07/17, MRN [id number]  PCP: [name], MD Primary Electrophysiologist: Lovena Le Referring Physician: Lovena Le  CC: to discuss Watchman implant   History of Present Illness: Peter Dunlap is a 60 y.o. male who presents today for evaluation of left atrial appendage occluder. He has paroxysmal atrial fibrillation as well as tachy brady syndrome s/p PPM, morbid obesity, hypertension, and coronary artery disease. The patient has been evaluated by their referring physician and is felt to be a poor candidate for long term Wellman due to prior SDH after a fall. He therefore presents today for Watchman evaluation.   Echo 12/2014 demonstrated EF 60-65%, no RWMA, grade 2 diastolic dysfunction, small to moderate pericardial effusion, LA 46.   Cardiac catheterization 07/2014 demonstrated stable triple vessel CAD with patent stents to LAD and circumflex with no focal targets for PCI. Last intervention 01/2014 with DES to left circumflex.  He is compliant with CPAP and has had weight gain after thyroidectomy. He is working on diet and exercise but is limited by knee pain. He has occasional non-exertional chest pain that is clearly different than prior chest pain with stents. He also has occasional headaches that are very different from prior SDH headaches, come and go. He most notices his AF at night with palpitations.   Today,  he denies symptoms of shortness of breath, orthopnea, PND, lower extremity edema, claudication, dizziness, presyncope, syncope, bleeding, or neurologic sequela. The patient is tolerating medications without difficulties and is otherwise without complaint today.    Past Medical History  Diagnosis Date  . Hypertension   . Arteriosclerotic cardiovascular disease (ASCVD) 2005    catheterization in 10/2010:50% mid LAD, diffuse distal disease, circumflex irregularities, large dominant RCA with a 50% ostial, 70% distal, 60% posterolateral and 70% PDA; normal EF  . Cerebrovascular disease 2010    R. carotid endarterectomy; Duplex in 10/2010-widely patent ICAs, subtotal left vertebral-not thought to be contributing to symptoms  . Hyperlipidemia   . Obesity   . Tobacco abuse     Quit 2014  . Benign prostatic hypertrophy   . Cervical spine disease     CT in 2012-advanced degeneration and spondylosis with moderate spinal stenosis at C3-C6  . H/O: substance abuse     Cocaine, marijuana, alcohol. Quit 2013.   [name] Gastroesophageal reflux disease   . Depression   . Erectile dysfunction   . H/O hiatal hernia   . Arthritis   . Non-ST elevation myocardial infarction (NSTEMI), initial episode of care [facility]) 12/02/2013    DES LAD  . Thyroid disease   . Sleep apnea     CPAP  . Tachy-brady syndrome ([place])     a. s/p STJ dual chamber PPM    Past Surgical History  Procedure  Laterality Date  . Radiofrequency ablation  2005    for PSVT  . Coronary angioplasty with stent placement  12/03/2013    LAD 90%-->0% W/ Promus Premier DES 3.0 mm x 16 mm, CFX OK, RCA 40%, EF 70-75%  . Carotid endarterectomy Right Feb. 25, 2010    CEA  . Trudee Kuster hole Right 04/13/2014    Procedure: Haskell Flirt; Surgeon: Charlie Pitter, MD; Location: [place] NEURO ORS; Service: Neurosurgery; Laterality: Right;  . Left heart  catheterization with coronary angiogram Left 12/03/2013    Procedure: LEFT HEART CATHETERIZATION WITH CORONARY ANGIOGRAM; Surgeon: Leonie Man, MD; Location: [facility] CATH LAB; Service: Cardiovascular; Laterality: Left;  . Percutaneous coronary stent intervention (pci-s)  12/03/2013    Procedure: PERCUTANEOUS CORONARY STENT INTERVENTION (PCI-S); Surgeon: Leonie Man, MD; Location: [facility] CATH LAB; Service: Cardiovascular;;  . Left heart catheterization with coronary angiogram N/A 01/26/2014    Procedure: LEFT HEART CATHETERIZATION WITH CORONARY ANGIOGRAM; Surgeon: Jettie Booze, MD; Location: [facility] CATH LAB; Service: Cardiovascular; Laterality: N/A;  . Left heart catheterization with coronary angiogram N/A 08/03/2014    Procedure: LEFT HEART CATHETERIZATION WITH CORONARY ANGIOGRAM; Surgeon: Burnell Blanks, MD; Location: [facility] CATH LAB; Service: Cardiovascular; Laterality: N/A;  . Permanent pacemaker insertion N/A 09/18/2014    Procedure: PERMANENT PACEMAKER INSERTION; Surgeon: [name], MD; Location: [facility] CATH LAB; Service: Cardiovascular; Laterality: N/A;  . Brain surgery  2015    hematoma evacuation     Current Outpatient Prescriptions  Medication Sig Dispense Refill  . clopidogrel (PLAVIX) 75 MG tablet Take 1 tablet (75 mg total) by mouth daily. 90 tablet 3  . dexlansoprazole (DEXILANT) 60 MG capsule Take 1 capsule (60 mg total) by mouth daily. 30 capsule 0  . furosemide (LASIX) 20 MG tablet TAKE 3 TABLETS BY MOUTH DAILY 90 tablet 6  . hydrocortisone 2.5 % cream Apply rectally BID. Please dispense with rectal tip. 30 g 0  . isosorbide mononitrate (IMDUR) 60 MG 24 hr tablet TAKE 1 TABLET BY MOUTH EVERY DAY 90 tablet 2  . levothyroxine (SYNTHROID, LEVOTHROID) 175 MCG tablet Take 1 tablet (175 mcg total) by mouth daily before breakfast. 30 tablet 3  . metFORMIN (GLUCOPHAGE) 500 MG tablet Take  1 tablet (500 mg total) by mouth 2 (two) times daily with a meal. 60 tablet 3  . metoprolol tartrate (LOPRESSOR) 25 MG tablet Take 1 tablet (25 mg total) by mouth 2 (two) times daily. 180 tablet 1  . nitroGLYCERIN (NITROSTAT) 0.4 MG SL tablet Place 0.4 mg under the tongue every 5 (five) minutes as needed for chest pain (x 3 doses).    . potassium chloride SA (K-DUR,KLOR-CON) 20 MEQ tablet TAKE 1 TABLET BY MOUTH EVERY DAY 30 tablet 5  . RAPAFLO 8 MG CAPS capsule TAKE 1 CAPSULE(8 MG) BY MOUTH DAILY 30 capsule 0  . simvastatin (ZOCOR) 40 MG tablet Take 40 mg by mouth daily.     . traZODone (DESYREL) 50 MG tablet TAKE 1/2 TO 1 TABLET BY MOUTH AT BEDTIME AS NEEDED FOR SLEEP 30 tablet 3   No current facility-administered medications for this visit.   Facility-Administered Medications Ordered in Other Visits  Medication Dose Route Frequency Provider Last Rate Last Dose  . 0.9 % sodium chloride infusion  Intravenous Continuous [name], MD      Allergies: Lactose intolerance (gi)   Social History: The patient  reports that he quit smoking about 2 years ago. His smoking use included Cigarettes. He started smoking about 42 years  ago. He has a 40 pack-year smoking history. He has never used smokeless tobacco. He reports that he does not drink alcohol or use illicit drugs.   Family History: The patient's family history includes Coronary artery disease (age of onset: 21) in his father; Diabetes in his brother, mother, and sister; Diabetes type II in his father; Heart attack in his father; Heart attack (age of onset: 74) in his sister; Hypertension in his brother, father, mother, and sister; Lung cancer in his paternal uncle.    ROS: Please see the history of present illness. All other systems are reviewed and negative.    PHYSICAL EXAM: VS: BP 116/78 mmHg  Pulse 80  Ht 6' (1.829 m)  Wt 323 lb 6.4 oz (146.693 kg)  BMI 43.85 kg/m2  , BMI Body mass index is 43.85 kg/(m^2). GEN: Obese, well developed, in no acute distress  HEENT: normal  Neck: no JVD, carotid bruits, or masses Cardiac: RRR; no murmurs, rubs, or gallops,no edema  Respiratory: clear to auscultation bilaterally, normal work of breathing GI: soft, nontender, nondistended, + BS MS: no deformity or atrophy  Skin: warm and dry  Neuro: Strength and sensation are intact Psych: euthymic mood, full affect  EKG: EKG is not ordered today.  Recent Labs: 12/19/2014: B Natriuretic Peptide 77.2 12/20/2014: ALT 15*; BUN 11; Creatinine, Ser 1.16; Magnesium 1.7; Potassium 3.3*; Sodium 138 03/23/2015: Hemoglobin 16.1; Platelets 148* 06/26/2015: TSH 34.00*    Lipid Panel   Labs (Brief)       Component Value Date/Time   CHOL 127 12/20/2014 0430   TRIG 84 12/20/2014 0430   HDL 33* 12/20/2014 0430   CHOLHDL 3.8 12/20/2014 0430   VLDL 17 12/20/2014 0430   LDLCALC 77 12/20/2014 0430       Wt Readings from Last 3 Encounters:  07/21/15 323 lb 6.4 oz (146.693 kg)  07/06/15 328 lb 12.8 oz (149.143 kg)  07/05/15 326 lb (147.873 kg)      Other studies Reviewed: Additional studies/ records that were reviewed today include: Dr Tanna Furry office notes, echo, cath reports   ASSESSMENT AND PLAN:  1. Paroxysmal atrial fibrillation Burden by device interrogation today 16% I have seen ROMANE MOORADIAN is a 60 y.o. male in the office today who has been referred by Dr Lovena Le for a Watchman left atrial appendage closure device. He has a history of paroxysmal atrial fibrillation. This patients CHA2DS2-VASc Score is equal to 4 which necessitates long term oral anticoagulation to prevent stroke. HasBled score is 3. Modified Rankin Score is 1. Unfortunately, He is not felt to be a long term Warfarin candidate secondary to prior SDH. The patients chart has been reviewed and I along with their referring cardiologist feel that they  would be a candidate for short term oral anticoagulation. Procedural risks for the Watchman implant have been reviewed with the patient including a 1% risk of stroke, 2% risk of perforation, 0.1% risk of device embolization. Given the patient's poor candidacy for long-term oral anticoagulation, ability to tolerate short term oral anticoagulation, I have recommended the watchman left atrial appendage closure system. TEE will be scheduled to review LAA anatomy. The patient understands that the ability to implant Watchman is dependent on results of the TEE. If patient is candidate for Watchman based on TEE results, we will schedule the procedure at the next available time.  Risks, benefits of TEE reviewed with patient today. He is s/p thyroid iodine treatment with tablets, he has not had direct radiation. Discussed with Dr  Crenshaw today - ok to proceed.  I will call his neurosurgeon to be sure he thinks that short term Warfarin and ASA followed by ASA/Plavix is ok with prior SDH.  2. Morbid obesity Weight loss advised  3. HTN Stable No change required today  4. CAD No recent ischemic symptoms Last DES placed 01/2014 Plan will be to discontinue Plavix at time of Watchman implant, start Warfarin and ASA, at 6 weeks resume Plavix and ASA then discontinue Plavix at 6 months post implant  5. OSA Compliance with CPAP advised    Follow-up: With EP NP following Watchman implant   Current medicines are reviewed at length with the patient today.  The patient does not have concerns regarding his medicines. The following changes were made today: none  Labs/ tests ordered today include: pre-procedure labs, TEE   Army Fossa MD 07/21/2015 9:08 AM   [facility] [place] [address] [phone] (office) [phone] (fax)       For TEE; no changes Kirk Ruths

## 2015-07-27 NOTE — Discharge Instructions (Signed)

## 2015-07-27 NOTE — Interval H&P Note (Signed)
History and Physical Interval Note:  07/27/2015 8:59 AM  Peter Dunlap  has presented today for surgery, with the diagnosis of AFIB/WATHCHMAN SCREENING  The various methods of treatment have been discussed with the patient and family. After consideration of risks, benefits and other options for treatment, the patient has consented to  Procedure(s): TRANSESOPHAGEAL ECHOCARDIOGRAM (TEE) (N/A) as a surgical intervention .  The patient's history has been reviewed, patient examined, no change in status, stable for surgery.  I have reviewed the patient's chart and labs.  Questions were answered to the patient's satisfaction.     Kirk Ruths

## 2015-07-27 NOTE — H&P (View-Only) (Signed)
Watchman Consult Note   Date:  07/21/2015   ID:  Peter Dunlap, DOB Jan 28, 1956, MRN [id number]  PCP:  [name], MD  Primary Electrophysiologist: Lovena Le Referring Physician: Lovena Le   CC: to discuss Watchman implant    History of Present Illness: Peter Dunlap is a 60 y.o. male who presents today for evaluation of left atrial appendage occluder.  He has paroxysmal atrial fibrillation as well as tachy brady syndrome s/p PPM, morbid obesity, hypertension, and coronary artery disease.  The patient has been evaluated by their referring physician and is felt to be a poor candidate for long term [place] due to prior SDH after a fall.  He therefore presents today for Watchman evaluation.   Echo 12/2014 demonstrated EF 60-65%, no RWMA, grade 2 diastolic dysfunction, small to moderate pericardial effusion, LA 46.   Cardiac catheterization 07/2014 demonstrated stable triple vessel CAD with patent stents to LAD and circumflex with no focal targets for PCI. Last intervention 01/2014 with DES to left circumflex.  He is compliant with CPAP and has had weight gain after thyroidectomy.  He is working on diet and exercise but is limited by knee pain. He has occasional non-exertional chest pain that is clearly different than prior chest pain with stents.  He also has occasional headaches that are very different from prior SDH headaches, come and go. He most notices his AF at night with palpitations.   Today, he denies symptoms of shortness of breath, orthopnea, PND, lower extremity edema, claudication, dizziness, presyncope, syncope, bleeding, or neurologic sequela. The patient is tolerating medications without difficulties and is otherwise without complaint today.    Past Medical History  Diagnosis Date  . Hypertension   . Arteriosclerotic cardiovascular disease (ASCVD) 2005    catheterization in 10/2010:50% mid LAD, diffuse distal disease, circumflex irregularities, large dominant RCA with a 50%  ostial, 70% distal, 60% posterolateral and 70% PDA; normal EF  . Cerebrovascular disease 2010    R. carotid endarterectomy; Duplex in 10/2010-widely patent ICAs, subtotal left vertebral-not thought to be contributing to symptoms  . Hyperlipidemia   . Obesity   . Tobacco abuse     Quit 2014  . Benign prostatic hypertrophy   . Cervical spine disease     CT in 2012-advanced degeneration and spondylosis with moderate spinal stenosis at C3-C6  . H/O: substance abuse     Cocaine, marijuana, alcohol.  Quit 2013.   [name] Gastroesophageal reflux disease   . Depression   . Erectile dysfunction   . H/O hiatal hernia   . Arthritis   . Non-ST elevation myocardial infarction (NSTEMI), initial episode of care [facility]) 12/02/2013    DES LAD  . Thyroid disease   . Sleep apnea     CPAP  . Tachy-brady syndrome ([place])     a. s/p STJ dual chamber PPM    Past Surgical History  Procedure Laterality Date  . Radiofrequency ablation  2005    for PSVT  . Coronary angioplasty with stent placement  12/03/2013    LAD 90%-->0% W/ Promus Premier DES 3.0 mm x 16 mm, CFX OK, RCA 40%, EF 70-75%  . Carotid endarterectomy Right Feb. 25, 2010     CEA  . Trudee Kuster hole Right 04/13/2014    Procedure: Haskell Flirt;  Surgeon: Charlie Pitter, MD;  Location: [place] NEURO ORS;  Service: Neurosurgery;  Laterality: Right;  . Left heart catheterization with coronary angiogram Left 12/03/2013    Procedure: LEFT HEART CATHETERIZATION WITH CORONARY ANGIOGRAM;  Surgeon: Leonie Man, MD;  Location: [facility] CATH LAB;  Service: Cardiovascular;  Laterality: Left;  . Percutaneous coronary stent intervention (pci-s)  12/03/2013    Procedure: PERCUTANEOUS CORONARY STENT INTERVENTION (PCI-S);  Surgeon: Leonie Man, MD;  Location: [facility] CATH LAB;  Service: Cardiovascular;;  . Left heart catheterization with coronary angiogram N/A 01/26/2014    Procedure: LEFT HEART CATHETERIZATION WITH CORONARY ANGIOGRAM;  Surgeon: Jettie Booze, MD;  Location: [facility] CATH LAB;   Service: Cardiovascular;  Laterality: N/A;  . Left heart catheterization with coronary angiogram N/A 08/03/2014    Procedure: LEFT HEART CATHETERIZATION WITH CORONARY ANGIOGRAM;  Surgeon: Burnell Blanks, MD;  Location: [facility] CATH LAB;  Service: Cardiovascular;  Laterality: N/A;  . Permanent pacemaker insertion N/A 09/18/2014    Procedure: PERMANENT PACEMAKER INSERTION;  Surgeon: [name], MD;  Location: [facility] CATH LAB;  Service: Cardiovascular;  Laterality: N/A;  . Brain surgery  2015    hematoma evacuation     Current Outpatient Prescriptions  Medication Sig Dispense Refill  . clopidogrel (PLAVIX) 75 MG tablet Take 1 tablet (75 mg total) by mouth daily. 90 tablet 3  . dexlansoprazole (DEXILANT) 60 MG capsule Take 1 capsule (60 mg total) by mouth daily. 30 capsule 0  . furosemide (LASIX) 20 MG tablet TAKE 3 TABLETS BY MOUTH DAILY 90 tablet 6  . hydrocortisone 2.5 % cream Apply rectally BID. Please dispense with rectal tip. 30 g 0  . isosorbide mononitrate (IMDUR) 60 MG 24 hr tablet TAKE 1 TABLET BY MOUTH EVERY DAY 90 tablet 2  . levothyroxine (SYNTHROID, LEVOTHROID) 175 MCG tablet Take 1 tablet (175 mcg total) by mouth daily before breakfast. 30 tablet 3  . metFORMIN (GLUCOPHAGE) 500 MG tablet Take 1 tablet (500 mg total) by mouth 2 (two) times daily with a meal. 60 tablet 3  . metoprolol tartrate (LOPRESSOR) 25 MG tablet Take 1 tablet (25 mg total) by mouth 2 (two) times daily. 180 tablet 1  . nitroGLYCERIN (NITROSTAT) 0.4 MG SL tablet Place 0.4 mg under the tongue every 5 (five) minutes as needed for chest pain (x 3 doses).    . potassium chloride SA (K-DUR,KLOR-CON) 20 MEQ tablet TAKE 1 TABLET BY MOUTH EVERY DAY 30 tablet 5  . RAPAFLO 8 MG CAPS capsule TAKE 1 CAPSULE(8 MG) BY MOUTH DAILY 30 capsule 0  . simvastatin (ZOCOR) 40 MG tablet Take 40 mg by mouth daily.     . traZODone (DESYREL) 50 MG tablet TAKE 1/2 TO 1 TABLET BY MOUTH AT BEDTIME AS NEEDED FOR SLEEP 30 tablet 3   No  current facility-administered medications for this visit.   Facility-Administered Medications Ordered in Other Visits  Medication Dose Route Frequency Provider Last Rate Last Dose  . 0.9 %  sodium chloride infusion   Intravenous Continuous [name], MD        Allergies:   Lactose intolerance (gi)   Social History:  The patient  reports that he quit smoking about 2 years ago. His smoking use included Cigarettes. He started smoking about 42 years ago. He has a 40 pack-year smoking history. He has never used smokeless tobacco. He reports that he does not drink alcohol or use illicit drugs.   Family History:  The patient's family history includes Coronary artery disease (age of onset: 67) in his father; Diabetes in his brother, mother, and sister; Diabetes type II in his father; Heart attack in his father; Heart attack (age of onset: 25) in his sister; Hypertension  in his brother, father, mother, and sister; Lung cancer in his paternal uncle.    ROS:  Please see the history of present illness.   All other systems are reviewed and negative.    PHYSICAL EXAM: VS:  BP 116/78 mmHg  Pulse 80  Ht 6' (1.829 m)  Wt 323 lb 6.4 oz (146.693 kg)  BMI 43.85 kg/m2 , BMI Body mass index is 43.85 kg/(m^2). GEN: Obese, well developed, in no acute distress HEENT: normal Neck: no JVD, carotid bruits, or masses Cardiac: RRR; no murmurs, rubs, or gallops,no edema  Respiratory:  clear to auscultation bilaterally, normal work of breathing GI: soft, nontender, nondistended, + BS MS: no deformity or atrophy Skin: warm and dry  Neuro:  Strength and sensation are intact Psych: euthymic mood, full affect  EKG:  EKG is not ordered today.  Recent Labs: 12/19/2014: B Natriuretic Peptide 77.2 12/20/2014: ALT 15*; BUN 11; Creatinine, Ser 1.16; Magnesium 1.7; Potassium 3.3*; Sodium 138 03/23/2015: Hemoglobin 16.1; Platelets 148* 06/26/2015: TSH 34.00*    Lipid Panel     Component Value Date/Time   CHOL  127 12/20/2014 0430   TRIG 84 12/20/2014 0430   HDL 33* 12/20/2014 0430   CHOLHDL 3.8 12/20/2014 0430   VLDL 17 12/20/2014 0430   LDLCALC 77 12/20/2014 0430     Wt Readings from Last 3 Encounters:  07/21/15 323 lb 6.4 oz (146.693 kg)  07/06/15 328 lb 12.8 oz (149.143 kg)  07/05/15 326 lb (147.873 kg)      Other studies Reviewed: Additional studies/ records that were reviewed today include: Dr Tanna Furry office notes, echo, cath reports   ASSESSMENT AND PLAN:  1.  Paroxysmal atrial fibrillation Burden by device interrogation today 16% I have seen Peter Dunlap is a 60 y.o. male in the office today who has been referred by Dr Lovena Le for a Watchman left atrial appendage closure device.  He has a history of paroxysmal atrial fibrillation.  This patients CHA2DS2-VASc Score is equal to 4 which necessitates long term oral anticoagulation to prevent stroke. HasBled score is 3.  Modified Rankin Score is 1. Unfortunately, He is not felt to be a long term Warfarin candidate secondary to prior SDH.  The patients chart has been reviewed and I along with their referring cardiologist feel that they would be a candidate for short term oral anticoagulation.  Procedural risks for the Watchman implant have been reviewed with the patient including a 1% risk of stroke, 2% risk of perforation, 0.1% risk of device embolization.  Given the patient's poor candidacy for long-term oral anticoagulation, ability to tolerate short term oral anticoagulation, I have recommended the watchman left atrial appendage closure system.  TEE will be scheduled to review LAA anatomy.  The patient understands that the ability to implant Watchman is dependent on results of the TEE.  If patient is candidate for Watchman based on TEE results, we will schedule the procedure at the next available time.  Risks, benefits of TEE reviewed with patient today. He is s/p thyroid iodine treatment with tablets, he has not had direct radiation.   Discussed with Dr Stanford Breed today - ok to proceed.  I will call his neurosurgeon to be sure he thinks that short term Warfarin and ASA followed by ASA/Plavix is ok with prior SDH.  2.  Morbid obesity Weight loss advised  3.  HTN Stable No change required today  4.  CAD No recent ischemic symptoms Last DES placed 01/2014 Plan will be to discontinue Plavix  at time of Watchman implant, start Warfarin and ASA, at 6 weeks resume Plavix and ASA then discontinue Plavix at 6 months post implant  5.  OSA Compliance with CPAP advised    Follow-up:  With EP NP following Watchman implant   Current medicines are reviewed at length with the patient today.   The patient does not have concerns regarding his medicines.  The following changes were made today:  none  Labs/ tests ordered today include: pre-procedure labs, TEE   Army Fossa MD 07/21/2015 9:08 AM     [facility] [address] [place] [address] [phone] (office) [phone] (fax)

## 2015-07-28 ENCOUNTER — Encounter (HOSPITAL_COMMUNITY): Payer: Self-pay | Admitting: Cardiology

## 2015-07-29 ENCOUNTER — Other Ambulatory Visit: Payer: Self-pay

## 2015-07-29 DIAGNOSIS — G4733 Obstructive sleep apnea (adult) (pediatric): Secondary | ICD-10-CM | POA: Diagnosis not present

## 2015-07-29 NOTE — Patient Outreach (Signed)
[place] [facility]) Care Management  [place]  07/29/2015   LEO INZUNZA 1955-07-21 [id number]  Subjective: Telephone call to patient for completion of initial assessment.  Patient reports he is doing ok after his TEE procedure to measure area for his watchman procedure next week. Patient reports he is going to have an overnight stay for observation.  Patient reports he is having some gout pain in his leg today but will be taking his pain medication and trying to rest.   Diabetes: Patient reports that his blood sugar today was 97.  Patient state he is unable to exercise due to gout pain.  However, he does report that he has cut down on how much he is eating. Discussed with patient the importance of limiting carbohydrates to help maintain blood sugar. Also discussed with patient how portion control helps with blood sugar maintenance and weight loss.  He verbalized understanding.    Objective:   Current Medications:  Current Outpatient Prescriptions  Medication Sig Dispense Refill  . allopurinol (ZYLOPRIM) 100 MG tablet Take 100 mg by mouth daily.    . clopidogrel (PLAVIX) 75 MG tablet Take 1 tablet (75 mg total) by mouth daily. 90 tablet 3  . dexlansoprazole (DEXILANT) 60 MG capsule Take 1 capsule (60 mg total) by mouth daily. 30 capsule 0  . furosemide (LASIX) 20 MG tablet TAKE 3 TABLETS BY MOUTH DAILY 90 tablet 6  . HYDROcodone-acetaminophen (NORCO) 7.5-325 MG tablet Take 1 tablet by mouth every 6 (six) hours as needed for moderate pain. 60 tablet 0  . hydrocortisone 2.5 % cream Apply rectally BID. Please dispense with rectal tip. 30 g 0  . isosorbide mononitrate (IMDUR) 60 MG 24 hr tablet TAKE 1 TABLET BY MOUTH EVERY DAY 90 tablet 2  . levothyroxine (SYNTHROID, LEVOTHROID) 175 MCG tablet Take 1 tablet (175 mcg total) by mouth daily before breakfast. 30 tablet 3  . metFORMIN (GLUCOPHAGE) 500 MG tablet Take 1 tablet (500 mg total) by mouth 2 (two) times daily with a  meal. 60 tablet 3  . metoprolol tartrate (LOPRESSOR) 25 MG tablet Take 1 tablet (25 mg total) by mouth 2 (two) times daily. 180 tablet 1  . nitroGLYCERIN (NITROSTAT) 0.4 MG SL tablet Place 0.4 mg under the tongue every 5 (five) minutes as needed for chest pain (x 3 doses).    . ONE TOUCH ULTRA TEST test strip USE TO TEST ONCE DAILY 50 each 11  . potassium chloride SA (K-DUR,KLOR-CON) 20 MEQ tablet TAKE 1 TABLET BY MOUTH EVERY DAY 30 tablet 5  . RAPAFLO 8 MG CAPS capsule TAKE 1 CAPSULE(8 MG) BY MOUTH DAILY 30 capsule 3  . simvastatin (ZOCOR) 40 MG tablet Take 40 mg by mouth daily.     . traZODone (DESYREL) 50 MG tablet TAKE 1/2 TO 1 TABLET BY MOUTH AT BEDTIME AS NEEDED FOR SLEEP 30 tablet 3   No current facility-administered medications for this visit.   Facility-Administered Medications Ordered in Other Visits  Medication Dose Route Frequency Provider Last Rate Last Dose  . 0.9 %  sodium chloride infusion   Intravenous Continuous [name], MD        Functional Status:  In your present state of health, do you have any difficulty performing the following activities: 07/29/2015 07/20/2015  Hearing? N N  Vision? N N  Difficulty concentrating or making decisions? N N  Walking or climbing stairs? N N  Dressing or bathing? N N  Doing errands, shopping? Tempie Donning  Preparing Food and eating ? N N  Using the Toilet? N -  In the past six months, have you accidently leaked urine? N -  Do you have problems with loss of bowel control? N -  Managing your Medications? Y Y  Managing your Finances? N N  Housekeeping or managing your Housekeeping? Tempie Donning    Fall/Depression Screening: PHQ 2/9 Scores 07/29/2015 07/20/2015 07/20/2015 07/05/2015 01/08/2015 01/01/2014  PHQ - 2 Score 0 0 0 0 0 0    Assessment: Patient will benefit from health coach outreach for diabetes disease management and support.    Plan:  [facility] CM Care Plan Problem One        Most Recent Value   Care Plan Problem One  Diabetes Knowledge  deficit   Role Documenting the Problem One  Health Coach   Care Plan for Problem One  Active   THN Long Term Goal (31-90 days)  Patient will verbalize care for diabetes such as diet  and blood sugar monitoring within the next 90 days.   THN Long Term Goal Start Date  07/29/15   Interventions for Problem One [place] discussed with patient foods high in carbohydrates and reasons to monitor blood sugar regularly.    THN CM Short Term Goal #1 (0-30 days)  Patient will name at least three signs of hypoglycemia within 30 days   THN CM Short Term Goal #1 Start Date  07/29/15   Interventions for Short Term Goal #1  Named signs of hypoglycemia with patient. Discussed snacks to consume to quickly increase blood sugar.   THN CM Short Term Goal #2 (0-30 days)  Patient will monitor and record blood sugars at least 3 times a week within the next 30 days.    THN CM Short Term Goal #2 Start Date  07/29/15   Interventions for Short Term Goal #2  Discussed with patient the importance of monitoring blood sugar and normal blood sugar ranges.      RN Health Coach will provide ongoing education for patient on diabetes through phone calls and sending printed information to patient for further discussion.  RN Health Coach sent welcome packet with consent to patient as well as printed information on diabetes.  RN Health Coach will send initial barriers letter, assessment, and care plan to primary care physician.  RN Health Coach will contact patient within one month and patient agrees to next contact.   [name], RN, MSN [place] [phone]

## 2015-08-02 ENCOUNTER — Telehealth: Payer: Self-pay | Admitting: Nurse Practitioner

## 2015-08-02 NOTE — Telephone Encounter (Signed)
Spoke with patient regarding upcoming planned Watchman procedure 08/05/15.  Reviewed instructions.  Pt to arrive at [place] Thursday morning. NPO after midnight Wednesday night. Hold plavix after Tuesday.  I have also left a message for Dr Annette Stable with neurosurgery to be sure he does not think that medication regimen required post procedure is prohibitive with history of SDH.  Patient is aware I will call him back if Dr Annette Stable does not think he will be able to tolerate short term [place].  Chanetta Marshall, NP 08/02/2015 10:44 AM

## 2015-08-05 ENCOUNTER — Inpatient Hospital Stay (HOSPITAL_COMMUNITY): Payer: Medicare Other

## 2015-08-05 ENCOUNTER — Encounter (HOSPITAL_COMMUNITY)
Admission: RE | Disposition: A | Discharge status: Home / Self Care | Payer: Self-pay | Source: Ambulatory Visit | Attending: Internal Medicine

## 2015-08-05 ENCOUNTER — Inpatient Hospital Stay (HOSPITAL_COMMUNITY): Payer: Medicare Other | Admitting: Certified Registered"

## 2015-08-05 ENCOUNTER — Inpatient Hospital Stay (HOSPITAL_COMMUNITY)
Admission: RE | Admit: 2015-08-05 | Discharge: 2015-08-06 | DRG: 274 | Disposition: A | Discharge status: Home / Self Care | Payer: Medicare Other | Source: Ambulatory Visit | Attending: Internal Medicine | Admitting: Internal Medicine

## 2015-08-05 ENCOUNTER — Encounter (HOSPITAL_COMMUNITY): Payer: Self-pay | Admitting: Certified Registered"

## 2015-08-05 DIAGNOSIS — Z6841 Body Mass Index (BMI) 40.0 and over, adult: Secondary | ICD-10-CM | POA: Diagnosis not present

## 2015-08-05 DIAGNOSIS — K219 Gastro-esophageal reflux disease without esophagitis: Secondary | ICD-10-CM | POA: Diagnosis not present

## 2015-08-05 DIAGNOSIS — Z833 Family history of diabetes mellitus: Secondary | ICD-10-CM

## 2015-08-05 DIAGNOSIS — T1490XA Injury, unspecified, initial encounter: Secondary | ICD-10-CM

## 2015-08-05 DIAGNOSIS — M1990 Unspecified osteoarthritis, unspecified site: Secondary | ICD-10-CM | POA: Diagnosis not present

## 2015-08-05 DIAGNOSIS — I481 Persistent atrial fibrillation: Secondary | ICD-10-CM | POA: Diagnosis not present

## 2015-08-05 DIAGNOSIS — M79672 Pain in left foot: Secondary | ICD-10-CM | POA: Diagnosis not present

## 2015-08-05 DIAGNOSIS — Z87891 Personal history of nicotine dependence: Secondary | ICD-10-CM

## 2015-08-05 DIAGNOSIS — I480 Paroxysmal atrial fibrillation: Secondary | ICD-10-CM | POA: Diagnosis not present

## 2015-08-05 DIAGNOSIS — Z7984 Long term (current) use of oral hypoglycemic drugs: Secondary | ICD-10-CM | POA: Diagnosis not present

## 2015-08-05 DIAGNOSIS — E785 Hyperlipidemia, unspecified: Secondary | ICD-10-CM | POA: Diagnosis not present

## 2015-08-05 DIAGNOSIS — I10 Essential (primary) hypertension: Secondary | ICD-10-CM | POA: Diagnosis not present

## 2015-08-05 DIAGNOSIS — I679 Cerebrovascular disease, unspecified: Secondary | ICD-10-CM | POA: Diagnosis present

## 2015-08-05 DIAGNOSIS — I2510 Atherosclerotic heart disease of native coronary artery without angina pectoris: Secondary | ICD-10-CM | POA: Diagnosis present

## 2015-08-05 DIAGNOSIS — Z950 Presence of cardiac pacemaker: Secondary | ICD-10-CM | POA: Diagnosis not present

## 2015-08-05 DIAGNOSIS — N400 Enlarged prostate without lower urinary tract symptoms: Secondary | ICD-10-CM | POA: Diagnosis not present

## 2015-08-05 DIAGNOSIS — Z006 Encounter for examination for normal comparison and control in clinical research program: Secondary | ICD-10-CM

## 2015-08-05 DIAGNOSIS — Z7901 Long term (current) use of anticoagulants: Secondary | ICD-10-CM

## 2015-08-05 DIAGNOSIS — Z8249 Family history of ischemic heart disease and other diseases of the circulatory system: Secondary | ICD-10-CM | POA: Diagnosis not present

## 2015-08-05 DIAGNOSIS — Z955 Presence of coronary angioplasty implant and graft: Secondary | ICD-10-CM | POA: Diagnosis not present

## 2015-08-05 DIAGNOSIS — Z79899 Other long term (current) drug therapy: Secondary | ICD-10-CM

## 2015-08-05 DIAGNOSIS — I252 Old myocardial infarction: Secondary | ICD-10-CM

## 2015-08-05 DIAGNOSIS — G4733 Obstructive sleep apnea (adult) (pediatric): Secondary | ICD-10-CM | POA: Diagnosis not present

## 2015-08-05 DIAGNOSIS — S99921A Unspecified injury of right foot, initial encounter: Secondary | ICD-10-CM | POA: Diagnosis not present

## 2015-08-05 DIAGNOSIS — I4891 Unspecified atrial fibrillation: Secondary | ICD-10-CM | POA: Diagnosis not present

## 2015-08-05 DIAGNOSIS — I4892 Unspecified atrial flutter: Secondary | ICD-10-CM | POA: Diagnosis present

## 2015-08-05 HISTORY — PX: LEFT ATRIAL APPENDAGE OCCLUSION: SHX173A

## 2015-08-05 LAB — CBC
HCT: 47.3 % (ref 39.0–52.0)
Hemoglobin: 16.7 g/dL (ref 13.0–17.0)
MCH: 33.5 pg (ref 26.0–34.0)
MCHC: 35.3 g/dL (ref 30.0–36.0)
MCV: 95 fL (ref 78.0–100.0)
Platelets: 160 10*3/uL (ref 150–400)
RBC: 4.98 MIL/uL (ref 4.22–5.81)
RDW: 13.1 % (ref 11.5–15.5)
WBC: 9 10*3/uL (ref 4.0–10.5)

## 2015-08-05 LAB — BASIC METABOLIC PANEL
Anion gap: 10 (ref 5–15)
BUN: 10 mg/dL (ref 6–20)
CO2: 23 mmol/L (ref 22–32)
Calcium: 9.2 mg/dL (ref 8.9–10.3)
Chloride: 108 mmol/L (ref 101–111)
Creatinine, Ser: 1.3 mg/dL — ABNORMAL HIGH (ref 0.61–1.24)
GFR calc Af Amer: >60 mL/min (ref >60)
GFR calc non Af Amer: 59 mL/min — ABNORMAL LOW (ref >60)
Glucose, Bld: 109 mg/dL — ABNORMAL HIGH (ref 65–99)
Potassium: 3.7 mmol/L (ref 3.5–5.1)
Sodium: 141 mmol/L (ref 135–145)

## 2015-08-05 LAB — GLUCOSE, CAPILLARY
Glucose-Capillary: 101 mg/dL — ABNORMAL HIGH (ref 65–99)
Glucose-Capillary: 101 mg/dL — ABNORMAL HIGH (ref 65–99)

## 2015-08-05 LAB — TYPE AND SCREEN
ABO/RH(D): O POS
Antibody Screen: NEGATIVE

## 2015-08-05 LAB — POCT ACTIVATED CLOTTING TIME
Activated Clotting Time: 322 seconds
Activated Clotting Time: 157 seconds

## 2015-08-05 LAB — PROTIME-INR
INR: 1.2 (ref 0.00–1.49)
Prothrombin Time: 15.4 seconds — ABNORMAL HIGH (ref 11.6–15.2)

## 2015-08-05 LAB — MRSA PCR SCREENING: MRSA by PCR: NEGATIVE

## 2015-08-05 SURGERY — LEFT ATRIAL APPENDAGE OCCLUSION
Anesthesia: General

## 2015-08-05 MED ORDER — PHENOL 1.4 % MT LIQD
1.0000 | OROMUCOSAL | Status: DC | PRN
  Filled 2015-08-05: qty 177

## 2015-08-05 MED ORDER — TAMSULOSIN HCL 0.4 MG PO CAPS
0.4000 mg | ORAL_CAPSULE | Freq: Every day | ORAL | Status: DC
  Administered 2015-08-05: 0.4 mg via ORAL
  Filled 2015-08-05: qty 1

## 2015-08-05 MED ORDER — ROCURONIUM BROMIDE 100 MG/10ML IV SOLN
INTRAVENOUS | Status: DC | PRN
  Administered 2015-08-05: 40 mg via INTRAVENOUS
  Administered 2015-08-05: 10 mg via INTRAVENOUS

## 2015-08-05 MED ORDER — HEPARIN SODIUM (PORCINE) 1000 UNIT/ML IJ SOLN
INTRAMUSCULAR | Status: DC | PRN
  Administered 2015-08-05: 2000 [IU] via INTRAVENOUS

## 2015-08-05 MED ORDER — HEPARIN (PORCINE) IN NACL 2-0.9 UNIT/ML-% IJ SOLN
INTRAMUSCULAR | Status: AC
  Filled 2015-08-05: qty 500

## 2015-08-05 MED ORDER — SODIUM CHLORIDE 0.9 % IV SOLN: 250.0000 mL | INTRAVENOUS | Status: DC | PRN

## 2015-08-05 MED ORDER — CEFAZOLIN SODIUM-DEXTROSE 2-3 GM-% IV SOLR
INTRAVENOUS | Status: AC
  Filled 2015-08-05: qty 50

## 2015-08-05 MED ORDER — LEVOTHYROXINE SODIUM 75 MCG PO TABS
175.0000 ug | ORAL_TABLET | Freq: Every day | ORAL | Status: DC
  Administered 2015-08-06: 175 ug via ORAL
  Filled 2015-08-05: qty 1

## 2015-08-05 MED ORDER — SUGAMMADEX SODIUM 200 MG/2ML IV SOLN
INTRAVENOUS | Status: DC | PRN
  Administered 2015-08-05: 300 mg via INTRAVENOUS

## 2015-08-05 MED ORDER — SODIUM CHLORIDE 0.45 % IV SOLN: INTRAVENOUS | Status: DC

## 2015-08-05 MED ORDER — PROPRANOLOL HCL 1 MG/ML IV SOLN
INTRAVENOUS | Status: DC | PRN
  Administered 2015-08-05 (×3): .25 mg via INTRAVENOUS

## 2015-08-05 MED ORDER — PHENYLEPHRINE HCL 10 MG/ML IJ SOLN
10.0000 mg | INTRAVENOUS | Status: DC | PRN
  Administered 2015-08-05 (×2): 20 ug via INTRAVENOUS

## 2015-08-05 MED ORDER — LIDOCAINE HCL (CARDIAC) 20 MG/ML IV SOLN
INTRAVENOUS | Status: DC | PRN
  Administered 2015-08-05: 60 mg via INTRAVENOUS

## 2015-08-05 MED ORDER — WARFARIN - PHYSICIAN DOSING INPATIENT
Freq: Every day | Status: DC
Start: 2015-08-05 — End: 2015-08-06

## 2015-08-05 MED ORDER — PROPOFOL 10 MG/ML IV BOLUS
INTRAVENOUS | Status: DC | PRN
  Administered 2015-08-05: 160 mg via INTRAVENOUS

## 2015-08-05 MED ORDER — BUPIVACAINE HCL (PF) 0.25 % IJ SOLN
INTRAMUSCULAR | Status: DC | PRN
  Administered 2015-08-05: 10 mL

## 2015-08-05 MED ORDER — DEXTROSE 5 % IV SOLN
3.0000 g | INTRAVENOUS | Status: AC
  Administered 2015-08-05: 3 g via INTRAVENOUS
  Filled 2015-08-05: qty 3000

## 2015-08-05 MED ORDER — SODIUM CHLORIDE 0.9 % IV SOLN: INTRAVENOUS | Status: DC

## 2015-08-05 MED ORDER — HEPARIN SODIUM (PORCINE) 1000 UNIT/ML IJ SOLN
INTRAMUSCULAR | Status: DC | PRN
  Administered 2015-08-05: 10000 [IU] via INTRAVENOUS

## 2015-08-05 MED ORDER — PANTOPRAZOLE SODIUM 40 MG PO TBEC
40.0000 mg | DELAYED_RELEASE_TABLET | Freq: Every day | ORAL | Status: DC
  Administered 2015-08-05: 40 mg via ORAL
  Filled 2015-08-05: qty 1

## 2015-08-05 MED ORDER — CEFAZOLIN SODIUM 1-5 GM-% IV SOLN
INTRAVENOUS | Status: AC
  Filled 2015-08-05: qty 50

## 2015-08-05 MED ORDER — POTASSIUM CHLORIDE CRYS ER 20 MEQ PO TBCR: 20.0000 meq | EXTENDED_RELEASE_TABLET | Freq: Every day | ORAL | Status: DC

## 2015-08-05 MED ORDER — ACETAMINOPHEN 325 MG PO TABS: 650.0000 mg | ORAL_TABLET | ORAL | Status: DC | PRN

## 2015-08-05 MED ORDER — SODIUM CHLORIDE 0.9% FLUSH: 3.0000 mL | INTRAVENOUS | Status: DC | PRN

## 2015-08-05 MED ORDER — TRAZODONE HCL 50 MG PO TABS
25.0000 mg | ORAL_TABLET | Freq: Every evening | ORAL | Status: DC | PRN
Start: 2015-08-05 — End: 2015-08-06
  Administered 2015-08-05: 50 mg via ORAL
  Filled 2015-08-05: qty 1

## 2015-08-05 MED ORDER — HEPARIN (PORCINE) IN NACL 2-0.9 UNIT/ML-% IJ SOLN
INTRAMUSCULAR | Status: DC | PRN
  Administered 2015-08-05: 1000 mL

## 2015-08-05 MED ORDER — ALLOPURINOL 100 MG PO TABS: 100.0000 mg | ORAL_TABLET | Freq: Every day | ORAL | Status: DC

## 2015-08-05 MED ORDER — FUROSEMIDE 40 MG PO TABS: 60.0000 mg | ORAL_TABLET | Freq: Every day | ORAL | Status: DC

## 2015-08-05 MED ORDER — FENTANYL CITRATE (PF) 250 MCG/5ML IJ SOLN
INTRAMUSCULAR | Status: DC | PRN
  Administered 2015-08-05: 50 ug via INTRAVENOUS
  Administered 2015-08-05: 100 ug via INTRAVENOUS

## 2015-08-05 MED ORDER — SODIUM CHLORIDE 0.9% FLUSH
3.0000 mL | Freq: Two times a day (BID) | INTRAVENOUS | Status: DC
  Administered 2015-08-05 (×2): 3 mL via INTRAVENOUS

## 2015-08-05 MED ORDER — MIDAZOLAM HCL 2 MG/2ML IJ SOLN
INTRAMUSCULAR | Status: DC | PRN
  Administered 2015-08-05: 2 mg via INTRAVENOUS

## 2015-08-05 MED ORDER — ONDANSETRON HCL 4 MG/2ML IJ SOLN: 4.0000 mg | Freq: Four times a day (QID) | INTRAMUSCULAR | Status: DC | PRN

## 2015-08-05 MED ORDER — PROTAMINE SULFATE 10 MG/ML IV SOLN
INTRAVENOUS | Status: DC | PRN
  Administered 2015-08-05: 30 mg via INTRAVENOUS

## 2015-08-05 MED ORDER — WARFARIN SODIUM 5 MG PO TABS
5.0000 mg | ORAL_TABLET | Freq: Once | ORAL | Status: AC
  Administered 2015-08-05: 5 mg via ORAL
  Filled 2015-08-05: qty 1

## 2015-08-05 MED ORDER — BUPIVACAINE HCL (PF) 0.25 % IJ SOLN
INTRAMUSCULAR | Status: AC
  Filled 2015-08-05: qty 30

## 2015-08-05 MED ORDER — ESMOLOL HCL 100 MG/10ML IV SOLN
INTRAVENOUS | Status: DC | PRN
  Administered 2015-08-05: 25 mg via INTRAVENOUS

## 2015-08-05 MED ORDER — SUCCINYLCHOLINE CHLORIDE 20 MG/ML IJ SOLN
INTRAMUSCULAR | Status: DC | PRN
  Administered 2015-08-05: 120 mg via INTRAVENOUS

## 2015-08-05 MED ORDER — ISOSORBIDE MONONITRATE ER 60 MG PO TB24: 60.0000 mg | ORAL_TABLET | Freq: Every day | ORAL | Status: DC

## 2015-08-05 MED ORDER — HYDROCODONE-ACETAMINOPHEN 7.5-325 MG PO TABS
1.0000 | ORAL_TABLET | Freq: Four times a day (QID) | ORAL | Status: DC | PRN
  Administered 2015-08-05 – 2015-08-06 (×3): 1 via ORAL
  Filled 2015-08-05 (×4): qty 1

## 2015-08-05 MED ORDER — DILTIAZEM HCL 100 MG IV SOLR
5.0000 mg/h | INTRAVENOUS | Status: DC
  Filled 2015-08-05: qty 100

## 2015-08-05 MED ORDER — METOPROLOL TARTRATE 25 MG PO TABS
25.0000 mg | ORAL_TABLET | Freq: Two times a day (BID) | ORAL | Status: DC
  Administered 2015-08-05 (×2): 25 mg via ORAL
  Filled 2015-08-05 (×2): qty 1

## 2015-08-05 MED ORDER — HEPARIN SODIUM (PORCINE) 1000 UNIT/ML IJ SOLN
INTRAMUSCULAR | Status: AC
  Filled 2015-08-05: qty 1

## 2015-08-05 SURGICAL SUPPLY — 16 items
BLANKET WARM UNDERBOD FULL ACC (MISCELLANEOUS) ×1 IMPLANT
CATH DIAG 6FR PIGTAIL (CATHETERS) ×2 IMPLANT
KIT HEART LEFT (KITS) ×2 IMPLANT
NDL TRANSEP BRK 71CM 407200 (NEEDLE) IMPLANT
NEEDLE TRANSEP BRK 71CM 407200 (NEEDLE) ×2 IMPLANT
PACK CARDIAC CATHETERIZATION (CUSTOM PROCEDURE TRAY) ×2 IMPLANT
PAD DEFIB LIFELINK (PAD) ×1 IMPLANT
SHEATH INTRO CHECKFLO 16F 13 (SHEATH) IMPLANT
SHEATH INTRO CHECKFLO 16F 13CM (SHEATH) ×1
SHEATH PINNACLE 8F 10CM (SHEATH) ×2 IMPLANT
SHEATH SWARTZ TS SL2 63CM 8.5F (SHEATH) ×1 IMPLANT
TRANSDUCER W/STOPCOCK (MISCELLANEOUS) ×2 IMPLANT
TUBING CIL FLEX 10 FLL-RA (TUBING) ×1 IMPLANT
WATCHMAN ACCESS DOUBLE CURVE (SHEATH) ×1 IMPLANT
WATCHMAN CLOSURE 33MM (Prosthesis & Implant Heart) ×2 IMPLANT
WIRE AMPLATZ WHISKJ .035X260CM (WIRE) ×1 IMPLANT

## 2015-08-05 NOTE — Progress Notes (Signed)
  Echocardiogram Echocardiogram Transesophageal has been performed.  Peter Dunlap 08/05/2015, 1:22 PM

## 2015-08-05 NOTE — Anesthesia Preprocedure Evaluation (Addendum)
Anesthesia Evaluation  Patient identified by MRN, date of birth, ID band Patient awake    Airway Mallampati: III  TM Distance: >3 FB Neck ROM: Full    Dental  (+) Edentulous Upper, Dental Advisory Given   Pulmonary sleep apnea , former smoker,    breath sounds clear to auscultation       Cardiovascular hypertension, + Past MI  + pacemaker  Rhythm:Regular Rate:Normal     Neuro/Psych    GI/Hepatic hiatal hernia, GERD  ,  Endo/Other  diabetes  Renal/GU      Musculoskeletal   Abdominal (+) - obese,   Peds  Hematology   Anesthesia Other Findings   Reproductive/Obstetrics                            Anesthesia Physical Anesthesia Plan  ASA: III  Anesthesia Plan: General   Post-op Pain Management:    Induction: Intravenous  Airway Management Planned: Oral ETT  Additional Equipment: Arterial line  Intra-op Plan:   Post-operative Plan:   Informed Consent: I have reviewed the patients History and Physical, chart, labs and discussed the procedure including the risks, benefits and alternatives for the proposed anesthesia with the patient or authorized representative who has indicated his/her understanding and acceptance.   Dental advisory given  Plan Discussed with: CRNA and Surgeon  Anesthesia Plan Comments:         Anesthesia Quick Evaluation

## 2015-08-05 NOTE — Telephone Encounter (Signed)
Late entry - Discussed Watchman procedure with Dr Marchelle Folks staff and need for short term anticoagulation following device implant. Per Dr Annette Stable, any anticoagulation increases risk of recurrent SDH.    However, the patient is also at increased risk for stroke. He has tolerated Plavix without issue for >1 year.  Will discuss further with patient morning of procedure and have risks, benefits discussion at that time  Chanetta Marshall, NP 08/05/2015 9:57 AM

## 2015-08-05 NOTE — Interval H&P Note (Signed)
History and Physical Interval Note:  08/05/2015 10:01 AM  Peter Dunlap  has presented today for surgery, with the diagnosis of afib  The various methods of treatment have been discussed with the patient and family. After consideration of risks, benefits and other options for treatment, the patient has consented to  Procedure(s): LEFT ATRIAL APPENDAGE OCCLUSION (N/A) as a surgical intervention .  The patient's history has been reviewed, patient examined, no change in status, stable for surgery.  I have reviewed the patient's chart and labs.  Questions were answered to the patient's satisfaction.    Prior ICH after falling in the bathtub. Pt is aware that he has risks of bleeding on anticoagulation as well as stroke (chads2vasc score is at least 4).  He understands risks and benefits of left atrial appendage occlusion and wishes to proceed at this time.   [name]

## 2015-08-05 NOTE — Progress Notes (Signed)
Utilization Review Completed.Donne Anon T3/07/2015

## 2015-08-05 NOTE — Progress Notes (Addendum)
Site area: right groin a 16 french venous sheath was removed  Site Prior to Removal:  Level 0  Pressure Applied For 30 MINUTES    Minutes Beginning at 1400p  Manual:   No.  Patient Status During Pull:  stable  Post Pull Groin Site:  Level 0  Post Pull Instructions Given:  Yes.    Post Pull Pulses Present:  Yes.    Dressing Applied:  Yes.    Comments:  VS remain stable during sheath pull. Pain level is a 7 out of 10 after pain med.

## 2015-08-05 NOTE — Anesthesia Procedure Notes (Signed)
Procedure Name: Intubation Date/Time: 08/05/2015 11:49 AM Performed by: Barrington Ellison Pre-anesthesia Checklist: Patient identified, Emergency Drugs available, Suction available, Patient being monitored and Timeout performed Patient Re-evaluated:Patient Re-evaluated prior to inductionOxygen Delivery Method: Circle system utilized Preoxygenation: Pre-oxygenation with 100% oxygen Intubation Type: IV induction Ventilation: Mask ventilation without difficulty Laryngoscope Size: Mac and 4 Grade View: Grade I Tube type: Oral Tube size: 7.5 mm Number of attempts: 1 Airway Equipment and Method: Stylet Placement Confirmation: ETT inserted through vocal cords under direct vision,  positive ETCO2 and breath sounds checked- equal and bilateral Secured at: 22 cm Tube secured with: Tape Dental Injury: Teeth and Oropharynx as per pre-operative assessment

## 2015-08-05 NOTE — Transfer of Care (Signed)
Immediate Anesthesia Transfer of Care Note  Patient: Peter Dunlap  Procedure(s) Performed: Procedure(s): LEFT ATRIAL APPENDAGE OCCLUSION (N/A)  Patient Location: Cath Lab  Anesthesia Type:General  Level of Consciousness: awake, alert  and oriented  Airway & Oxygen Therapy: Patient Spontanous Breathing  Post-op Assessment: Report given to RN  Post vital signs: Reviewed and stable  Last Vitals:  Filed Vitals:   08/05/15 1251 08/05/15 1256  BP:    Pulse: 57 79  Temp:    Resp:      Complications: No apparent anesthesia complications

## 2015-08-05 NOTE — H&P (View-Only) (Signed)
Watchman Consult Note   Date:  07/21/2015   ID:  Peter Dunlap, DOB [birth year], MRN [id number]  PCP:  [name], MD  Primary Electrophysiologist: Lovena Le Referring Physician: Lovena Le   CC: to discuss Watchman implant    History of Present Illness: Peter Dunlap is a 60 y.o. male who presents today for evaluation of left atrial appendage occluder.  He has paroxysmal atrial fibrillation as well as tachy brady syndrome s/p PPM, morbid obesity, hypertension, and coronary artery disease.  The patient has been evaluated by their referring physician and is felt to be a poor candidate for long term [place] due to prior SDH after a fall.  He therefore presents today for Watchman evaluation.   Echo 12/2014 demonstrated EF 60-65%, no RWMA, grade 2 diastolic dysfunction, small to moderate pericardial effusion, LA 46.   Cardiac catheterization 07/2014 demonstrated stable triple vessel CAD with patent stents to LAD and circumflex with no focal targets for PCI. Last intervention 01/2014 with DES to left circumflex.  He is compliant with CPAP and has had weight gain after thyroidectomy.  He is working on diet and exercise but is limited by knee pain. He has occasional non-exertional chest pain that is clearly different than prior chest pain with stents.  He also has occasional headaches that are very different from prior SDH headaches, come and go. He most notices his AF at night with palpitations.   Today, he denies symptoms of shortness of breath, orthopnea, PND, lower extremity edema, claudication, dizziness, presyncope, syncope, bleeding, or neurologic sequela. The patient is tolerating medications without difficulties and is otherwise without complaint today.    Past Medical History  Diagnosis Date  . Hypertension   . Arteriosclerotic cardiovascular disease (ASCVD) 2005    catheterization in 10/2010:50% mid LAD, diffuse distal disease, circumflex irregularities, large dominant RCA with a 50%  ostial, 70% distal, 60% posterolateral and 70% PDA; normal EF  . Cerebrovascular disease 2010    R. carotid endarterectomy; Duplex in 10/2010-widely patent ICAs, subtotal left vertebral-not thought to be contributing to symptoms  . Hyperlipidemia   . Obesity   . Tobacco abuse     Quit 2014  . Benign prostatic hypertrophy   . Cervical spine disease     CT in 2012-advanced degeneration and spondylosis with moderate spinal stenosis at C3-C6  . H/O: substance abuse     Cocaine, marijuana, alcohol.  Quit 2013.   [name] Gastroesophageal reflux disease   . Depression   . Erectile dysfunction   . H/O hiatal hernia   . Arthritis   . Non-ST elevation myocardial infarction (NSTEMI), initial episode of care [facility]) 12/02/2013    DES LAD  . Thyroid disease   . Sleep apnea     CPAP  . Tachy-brady syndrome ([place])     a. s/p STJ dual chamber PPM    Past Surgical History  Procedure Laterality Date  . Radiofrequency ablation  2005    for PSVT  . Coronary angioplasty with stent placement  12/03/2013    LAD 90%-->0% W/ Promus Premier DES 3.0 mm x 16 mm, CFX OK, RCA 40%, EF 70-75%  . Carotid endarterectomy Right Feb. 25, 2010     CEA  . Trudee Kuster hole Right 04/13/2014    Procedure: Haskell Flirt;  Surgeon: Charlie Pitter, MD;  Location: [place] NEURO ORS;  Service: Neurosurgery;  Laterality: Right;  . Left heart catheterization with coronary angiogram Left 12/03/2013    Procedure: LEFT HEART CATHETERIZATION WITH CORONARY ANGIOGRAM;  Surgeon: Leonie Man, MD;  Location[facility] C[facility] LAB;  Service: Cardiovascular;  Laterality: Left;  . Percutaneous coronary stent intervention (pci-s)  12/03/2013    Procedure: PERCUTANEOUS CORONARY STENT INTERVENTION (PCI-S);  Surgeon: Leonie Man, MD;  Location: [facility] CATH LAB;  Service: Cardiovascular;;  . Left heart catheterization with coronary angiogram N/A 01/26/2014    Procedure: LEFT HEART CATHETERIZATION WITH CORONARY ANGIOGRAM;  Surgeon: Jettie Booze, MD;  Location[facility] C[facility] LAB;   Service: Cardiovascular;  Laterality: N/A;  . Left heart catheterization with coronary angiogram N/A 08/03/2014    Procedure: LEFT HEART CATHETERIZATION WITH CORONARY ANGIOGRAM;  Surgeon: Burnell Blanks, MD;  Location: [facility] CATH LAB;  Service: Cardiovascular;  Laterality: N/A;  . Permanent pacemaker insertion N/A 09/18/2014    Procedure: PERMANENT PACEMAKER INSERTION;  Surgeon: [name], MD;  Location: [facility] CATH LAB;  Service: Cardiovascular;  Laterality: N/A;  . Brain surgery  2015    hematoma evacuation     Current Outpatient Prescriptions  Medication Sig Dispense Refill  . clopidogrel (PLAVIX) 75 MG tablet Take 1 tablet (75 mg total) by mouth daily. 90 tablet 3  . dexlansoprazole (DEXILANT) 60 MG capsule Take 1 capsule (60 mg total) by mouth daily. 30 capsule 0  . furosemide (LASIX) 20 MG tablet TAKE 3 TABLETS BY MOUTH DAILY 90 tablet 6  . hydrocortisone 2.5 % cream Apply rectally BID. Please dispense with rectal tip. 30 g 0  . isosorbide mononitrate (IMDUR) 60 MG 24 hr tablet TAKE 1 TABLET BY MOUTH EVERY DAY 90 tablet 2  . levothyroxine (SYNTHROID, LEVOTHROID) 175 MCG tablet Take 1 tablet (175 mcg total) by mouth daily before breakfast. 30 tablet 3  . metFORMIN (GLUCOPHAGE) 500 MG tablet Take 1 tablet (500 mg total) by mouth 2 (two) times daily with a meal. 60 tablet 3  . metoprolol tartrate (LOPRESSOR) 25 MG tablet Take 1 tablet (25 mg total) by mouth 2 (two) times daily. 180 tablet 1  . nitroGLYCERIN (NITROSTAT) 0.4 MG SL tablet Place 0.4 mg under the tongue every 5 (five) minutes as needed for chest pain (x 3 doses).    . potassium chloride SA (K-DUR,KLOR-CON) 20 MEQ tablet TAKE 1 TABLET BY MOUTH EVERY DAY 30 tablet 5  . RAPAFLO 8 MG CAPS capsule TAKE 1 CAPSULE(8 MG) BY MOUTH DAILY 30 capsule 0  . simvastatin (ZOCOR) 40 MG tablet Take 40 mg by mouth daily.     . traZODone (DESYREL) 50 MG tablet TAKE 1/2 TO 1 TABLET BY MOUTH AT BEDTIME AS NEEDED FOR SLEEP 30 tablet 3   No  current facility-administered medications for this visit.   Facility-Administered Medications Ordered in Other Visits  Medication Dose Route Frequency Provider Last Rate Last Dose  . 0.9 %  sodium chloride infusion   Intravenous Continuous [name], MD        Allergies:   Lactose intolerance (gi)   Social History:  The patient  reports that he quit smoking about 2 years ago. His smoking use included Cigarettes. He started smoking about 42 years ago. He has a 40 pack-year smoking history. He has never used smokeless tobacco. He reports that he does not drink alcohol or use illicit drugs.   Family History:  The patient's family history includes Coronary artery disease (age of onset: 36) in his father; Diabetes in his brother, mother, and sister; Diabetes type II in his father; Heart attack in his father; Heart attack (age of onset: 56) in his sister; Hypertension  in his brother, father, mother, and sister; Lung cancer in his paternal uncle.    ROS:  Please see the history of present illness.   All other systems are reviewed and negative.    PHYSICAL EXAM: VS:  BP 116/78 mmHg  Pulse 80  Ht 6' (1.829 m)  Wt 323 lb 6.4 oz (146.693 kg)  BMI 43.85 kg/m2 , BMI Body mass index is 43.85 kg/(m^2). GEN: Obese, well developed, in no acute distress HEENT: normal Neck: no JVD, carotid bruits, or masses Cardiac: RRR; no murmurs, rubs, or gallops,no edema  Respiratory:  clear to auscultation bilaterally, normal work of breathing GI: soft, nontender, nondistended, + BS MS: no deformity or atrophy Skin: warm and dry  Neuro:  Strength and sensation are intact Psych: euthymic mood, full affect  EKG:  EKG is not ordered today.  Recent Labs: 12/19/2014: B Natriuretic Peptide 77.2 12/20/2014: ALT 15*; BUN 11; Creatinine, Ser 1.16; Magnesium 1.7; Potassium 3.3*; Sodium 138 03/23/2015: Hemoglobin 16.1; Platelets 148* 06/26/2015: TSH 34.00*    Lipid Panel     Component Value Date/Time   CHOL  127 12/20/2014 0430   TRIG 84 12/20/2014 0430   HDL 33* 12/20/2014 0430   CHOLHDL 3.8 12/20/2014 0430   VLDL 17 12/20/2014 0430   LDLCALC 77 12/20/2014 0430     Wt Readings from Last 3 Encounters:  07/21/15 323 lb 6.4 oz (146.693 kg)  07/06/15 328 lb 12.8 oz (149.143 kg)  07/05/15 326 lb (147.873 kg)      Other studies Reviewed: Additional studies/ records that were reviewed today include: Dr Tanna Furry office notes, echo, cath reports   ASSESSMENT AND PLAN:  1.  Paroxysmal atrial fibrillation Burden by device interrogation today 16% I have seen Peter Dunlap is a 60 y.o. male in the office today who has been referred by Dr Lovena Le for a Watchman left atrial appendage closure device.  He has a history of paroxysmal atrial fibrillation.  This patients CHA2DS2-VASc Score is equal to 4 which necessitates long term oral anticoagulation to prevent stroke. HasBled score is 3.  Modified Rankin Score is 1. Unfortunately, He is not felt to be a long term Warfarin candidate secondary to prior SDH.  The patients chart has been reviewed and I along with their referring cardiologist feel that they would be a candidate for short term oral anticoagulation.  Procedural risks for the Watchman implant have been reviewed with the patient including a 1% risk of stroke, 2% risk of perforation, 0.1% risk of device embolization.  Given the patient's poor candidacy for long-term oral anticoagulation, ability to tolerate short term oral anticoagulation, I have recommended the watchman left atrial appendage closure system.  TEE will be scheduled to review LAA anatomy.  The patient understands that the ability to implant Watchman is dependent on results of the TEE.  If patient is candidate for Watchman based on TEE results, we will schedule the procedure at the next available time.  Risks, benefits of TEE reviewed with patient today. He is s/p thyroid iodine treatment with tablets, he has not had direct radiation.   Discussed with Dr Stanford Breed today - ok to proceed.  I will call his neurosurgeon to be sure he thinks that short term Warfarin and ASA followed by ASA/Plavix is ok with prior SDH.  2.  Morbid obesity Weight loss advised  3.  HTN Stable No change required today  4.  CAD No recent ischemic symptoms Last DES placed 01/2014 Plan will be to discontinue Plavix  at time of Watchman implant, start Warfarin and ASA, at 6 weeks resume Plavix and ASA then discontinue Plavix at 6 months post implant  5.  OSA Compliance with CPAP advised    Follow-up:  With EP NP following Watchman implant   Current medicines are reviewed at length with the patient today.   The patient does not have concerns regarding his medicines.  The following changes were made today:  none  Labs/ tests ordered today include: pre-procedure labs, TEE   Army Fossa MD 07/21/2015 9:08 AM     [facility] [address] [place] [address] [phone] (office) [phone] (fax)

## 2015-08-06 DIAGNOSIS — I480 Paroxysmal atrial fibrillation: Principal | ICD-10-CM

## 2015-08-06 LAB — BASIC METABOLIC PANEL
Anion gap: 13 (ref 5–15)
BUN: 12 mg/dL (ref 6–20)
CO2: 24 mmol/L (ref 22–32)
Calcium: 8.8 mg/dL — ABNORMAL LOW (ref 8.9–10.3)
Chloride: 101 mmol/L (ref 101–111)
Creatinine, Ser: 1.37 mg/dL — ABNORMAL HIGH (ref 0.61–1.24)
GFR calc Af Amer: >60 mL/min (ref >60)
GFR calc non Af Amer: 55 mL/min — ABNORMAL LOW (ref >60)
Glucose, Bld: 107 mg/dL — ABNORMAL HIGH (ref 65–99)
Potassium: 3.6 mmol/L (ref 3.5–5.1)
Sodium: 138 mmol/L (ref 135–145)

## 2015-08-06 LAB — PROTIME-INR
INR: 1.29 (ref 0.00–1.49)
Prothrombin Time: 16.3 seconds — ABNORMAL HIGH (ref 11.6–15.2)

## 2015-08-06 MED ORDER — WARFARIN SODIUM 5 MG PO TABS: 5.0000 mg | ORAL_TABLET | Freq: Every day | ORAL | Status: DC

## 2015-08-06 MED ORDER — ASPIRIN EC 81 MG PO TBEC: 81.0000 mg | DELAYED_RELEASE_TABLET | Freq: Every day | ORAL | Status: DC

## 2015-08-06 MED FILL — Cefazolin Sodium for IV Soln 1 GM and Dextrose 4% (50 ML): INTRAVENOUS | Qty: 50 | Status: AC

## 2015-08-06 MED FILL — Cefazolin Sodium for IV Soln 2 GM and Dextrose 3% (50 ML): INTRAVENOUS | Qty: 50 | Status: AC

## 2015-08-06 NOTE — Discharge Summary (Signed)
ELECTROPHYSIOLOGY PROCEDURE DISCHARGE SUMMARY    Patient ID: Peter Dunlap,  MRN: [id number], DOB/AGE: 1955/09/20 60 y.o.  Admit date: 08/05/2015 Discharge date: 08/06/2015  Primary Care Physician: [name], MD Primary Cardiologist: Lovena Le Electrophysiologist: [name], MD  Primary Discharge Diagnosis:  Paroxysmal atrial fibrillation status post LAA occluder insertion this admission  Secondary Discharge Diagnosis:  1.  Previous traumatic SDH 2.  Obesity 3.  CAD 4.  HTN 5.  Hyperlipidemia 6.  Arthritis 7.  OSA 8.  Tachy/brady syndrome s/p PPM  Procedures This Admission:  1.  Insertion of left atrial appendage occluder (Watchman) on 08/05/15 by Dr Rayann Heman and Dr Burt Knack.  This study demonstrated successful implantation of Watchman LAA occluder with no early apparent complications. TEE at time of procedure demonstrated no leak around device.   Brief HPI: Peter Dunlap is a 60 y.o. male with a history of paroxysmal atrial fibrillation.  They are felt to not be a candidate for long term Warfarin due to prior SDH. Risks, benefits, and alternatives to Watchman implant were reviewed with the patient who wished to proceed.  The patient underwent TEE prior to the procedure which demonstrated appendage suitable for attempt at [facility] placement.    Hospital Course:  The patient was admitted and underwent Watchman insertion with details as outlined above.  They were monitored on telemetry overnight which demonstrated sinus rhythm, intermittent atrial fibrillation with RVR.  Groin was without complication on the day of discharge.  The patient was examined and considered to be stable for discharge.Wound care and restrictions were reviewed with the patient.   Discussed with the patient need to monitor for recurrent symptoms of SDH.  He expresses understanding and is aware to call the office with concerns.   This patients CHA2DS2-VASc Score and unadjusted Ischemic Stroke Rate (%  per year) is equal to 4.8 % stroke rate/year from a score of 4 Above score calculated as 1 point each if present [CHF, HTN, DM, Vascular=MI/PAD/Aortic Plaque, Age if 65-74, or Male] Above score calculated as 2 points each if present [Age > 75, or Stroke/TIA/TE]  The patient will be started on Warfarin at discharge with goal INR of 2-3.  Coumadin clinic follow up will be arranged in 4 days.  The patient will be seen by APP in 1 week for groin check, monitor for s/s of bleeding, CBC.  45 day TEE will also be scheduled at that time.   Physical Exam: Filed Vitals:   08/06/15 0359 08/06/15 0400 08/06/15 0500 08/06/15 0600  BP:  96/65    Pulse:  77 79 81  Temp: 98 F (36.7 C)     TempSrc: Oral     Resp:  17 20 22   Height:      Weight:      SpO2:  95% 97% 96%    GEN- The patient is morbidly obese appearing, alert and oriented x 3 today.   HEENT: normocephalic, atraumatic; sclera clear, conjunctiva pink; hearing intact; oropharynx clear; neck supple Lungs- Clear to ausculation bilaterally, normal work of breathing.  No wheezes, rales, rhonchi Heart- Regular rate and rhythm  GI- soft, non-tender, non-distended, bowel sounds present Extremities- no clubbing, cyanosis, or edema; DP/PT/radial pulses 2+ bilaterally, groin without hematoma/bruit MS- no significant deformity or atrophy Skin- warm and dry, no rash or lesion Psych- euthymic mood, full affect Neuro- strength and sensation are intact   Labs:   Lab Results  Component Value Date   WBC 9.0 08/05/2015   HGB 16.7  08/05/2015   HCT 47.3 08/05/2015   MCV 95.0 08/05/2015   PLT 160 08/05/2015     Recent Labs Lab 08/06/15 0547  NA 138  K 3.6  CL 101  CO2 24  BUN 12  CREATININE 1.37*  CALCIUM 8.8*  GLUCOSE 107*     Discharge Medications:    Medication List    STOP taking these medications        clopidogrel 75 MG tablet  Commonly known as:  PLAVIX      TAKE these medications        allopurinol 100 MG tablet    Commonly known as:  ZYLOPRIM  Take 100 mg by mouth daily.     aspirin EC 81 MG tablet  Take 1 tablet (81 mg total) by mouth daily.     dexlansoprazole 60 MG capsule  Commonly known as:  DEXILANT  Take 1 capsule (60 mg total) by mouth daily.     furosemide 20 MG tablet  Commonly known as:  LASIX  TAKE 3 TABLETS BY MOUTH DAILY     HYDROcodone-acetaminophen 7.5-325 MG tablet  Commonly known as:  NORCO  Take 1 tablet by mouth every 6 (six) hours as needed for moderate pain.     isosorbide mononitrate 60 MG 24 hr tablet  Commonly known as:  IMDUR  TAKE 1 TABLET BY MOUTH EVERY DAY     levothyroxine 175 MCG tablet  Commonly known as:  SYNTHROID, LEVOTHROID  Take 1 tablet (175 mcg total) by mouth daily before breakfast.     metFORMIN 500 MG tablet  Commonly known as:  GLUCOPHAGE  Take 1 tablet (500 mg total) by mouth 2 (two) times daily with a meal.     metoprolol tartrate 25 MG tablet  Commonly known as:  LOPRESSOR  Take 1 tablet (25 mg total) by mouth 2 (two) times daily.     nitroGLYCERIN 0.4 MG SL tablet  Commonly known as:  NITROSTAT  Place 0.4 mg under the tongue every 5 (five) minutes as needed for chest pain (x 3 doses).     ONE TOUCH ULTRA TEST test strip  Generic drug:  glucose blood  USE TO TEST ONCE DAILY     potassium chloride SA 20 MEQ tablet  Commonly known as:  K-DUR,KLOR-CON  TAKE 1 TABLET BY MOUTH EVERY DAY     RAPAFLO 8 MG Caps capsule  Generic drug:  silodosin  TAKE 1 CAPSULE(8 MG) BY MOUTH DAILY     simvastatin 40 MG tablet  Commonly known as:  ZOCOR  Take 40 mg by mouth daily.     traZODone 50 MG tablet  Commonly known as:  DESYREL  TAKE 1/2 TO 1 TABLET BY MOUTH AT BEDTIME AS NEEDED FOR SLEEP     warfarin 5 MG tablet  Commonly known as:  COUMADIN  Take 1 tablet (5 mg total) by mouth daily.        Disposition:  Discharge Instructions    Diet - low sodium heart healthy    Complete by:  As directed      Discharge instructions     Complete by:  As directed   No driving for 4 days. No lifting over 5 lbs for 1 week. No sexual activity for 1 week. You may return to work in 1 week. Keep procedure site clean & dry. If you notice increased pain, swelling, bleeding or pus, call/return!  You may shower, but no soaking baths/hot tubs/pools for 1 week.  Increase activity slowly    Complete by:  As directed           Follow-up Information    Follow up with [facility] On 08/09/2015.   Specialty:  Cardiology   Why:  at 9:30AM for coumadin check    Contact information:   [address] [place] [phone]      Follow up with [name], MD On 08/16/2015.   Specialty:  Cardiology   Why:  at [place] - to see Chanetta Marshall, NP    Contact information:   [place] [id number] [phone]       Duration of Discharge Encounter: Greater than 30 minutes including physician time.  Fidel Levy, NP 08/06/2015 7:38 AM  Patient seen, examined. Available data reviewed. Agree with findings, assessment, and plan as outlined by Chanetta Marshall, NP. The patient is interviewed and examined. His right groin site is clear without ecchymosis or hematoma. Plans as outlined above. He's had an uncomplicated early post procedural course. He will be treated with warfarin per protocol with FU TEE in 6 weeks.  Sherren Mocha, M.D. 08/06/2015 7:54 AM

## 2015-08-09 ENCOUNTER — Ambulatory Visit (INDEPENDENT_AMBULATORY_CARE_PROVIDER_SITE_OTHER): Payer: Medicare Other | Admitting: Pharmacist

## 2015-08-09 DIAGNOSIS — I679 Cerebrovascular disease, unspecified: Secondary | ICD-10-CM

## 2015-08-09 DIAGNOSIS — I480 Paroxysmal atrial fibrillation: Secondary | ICD-10-CM

## 2015-08-09 LAB — POCT INR: INR: 2.1

## 2015-08-10 ENCOUNTER — Telehealth: Payer: Self-pay | Admitting: *Deleted

## 2015-08-10 MED ORDER — SIMVASTATIN 40 MG PO TABS: 40.0000 mg | ORAL_TABLET | Freq: Every day | ORAL | Status: DC

## 2015-08-10 NOTE — Anesthesia Postprocedure Evaluation (Signed)
Anesthesia Post Note  Patient: Peter Dunlap  Procedure(s) Performed: Procedure(s) (LRB): LEFT ATRIAL APPENDAGE OCCLUSION (N/A)  Patient location during evaluation: PACU Anesthesia Type: General Level of consciousness: awake and alert Pain management: pain level controlled Vital Signs Assessment: post-procedure vital signs reviewed and stable Respiratory status: spontaneous breathing, nonlabored ventilation, respiratory function stable and patient connected to nasal cannula oxygen Cardiovascular status: blood pressure returned to baseline and stable Postop Assessment: no signs of nausea or vomiting Anesthetic complications: no    Last Vitals:  Filed Vitals:   08/06/15 0813 08/06/15 0840  BP: 109/50   Pulse: 75   Temp:  36.6 C  Resp:      Last Pain:  Filed Vitals:   08/06/15 0844  PainSc: 0-No pain                 MASSAGEE,JAMES TERRILL

## 2015-08-10 NOTE — Telephone Encounter (Signed)
Pt calling requesting refill on simvastatin, states he has requested numerous times and no one has filled it yet. Can you check on this and see what the issue is? I looked in his med list and do not see where we had refilled (or I would had refilled for him).   [facility] [id number] - [place], [address]

## 2015-08-10 NOTE — Telephone Encounter (Signed)
Refill appropriate and filled per protocol. 

## 2015-08-14 NOTE — Progress Notes (Signed)
Watchman Consult Note   Date:  08/16/2015   ID:  Peter Dunlap, DOB [birth year], MRN [id number]  PCP:  [name], MD  Primary Electrophysiologist: Lovena Le  CC: follow up Watchman implant    History of Present Illness: Peter Dunlap is a 60 y.o. male who presents today post Watchman implant. Since discharge, he denies symptoms of shortness of breath, orthopnea, PND, lower extremity edema, claudication, dizziness, presyncope, syncope, bleeding, or neurologic sequela. The patient is tolerating medications without difficulties and is otherwise without complaint today.  He has had no groin complications post procedure.   Past Medical History  Diagnosis Date  . Hypertension   . Arteriosclerotic cardiovascular disease (ASCVD) 2005    catheterization in 10/2010:50% mid LAD, diffuse distal disease, circumflex irregularities, large dominant RCA with a 50% ostial, 70% distal, 60% posterolateral and 70% PDA; normal EF  . Cerebrovascular disease 2010    R. carotid endarterectomy; Duplex in 10/2010-widely patent ICAs, subtotal left vertebral-not thought to be contributing to symptoms  . Hyperlipidemia   . Obesity   . Tobacco abuse     Quit 2014  . Benign prostatic hypertrophy   . Cervical spine disease     CT in 2012-advanced degeneration and spondylosis with moderate spinal stenosis at C3-C6  . H/O: substance abuse     Cocaine, marijuana, alcohol.  Quit 2013.   [name] Gastroesophageal reflux disease   . Depression   . Erectile dysfunction   . H/O hiatal hernia   . Arthritis   . Non-ST elevation myocardial infarction (NSTEMI), initial episode of care [facility]) 12/02/2013    DES LAD  . Thyroid disease   . Sleep apnea     CPAP  . Tachy-brady syndrome ([place])     a. s/p STJ dual chamber PPM    Past Surgical History  Procedure Laterality Date  . Radiofrequency ablation  2005    for PSVT  . Coronary angioplasty with stent placement  12/03/2013    LAD 90%-->0% W/ Promus Premier DES 3.0  mm x 16 mm, CFX OK, RCA 40%, EF 70-75%  . Carotid endarterectomy Right Feb. 25, 2010     CEA  . Trudee Kuster hole Right 04/13/2014    Procedure: Haskell Flirt;  Surgeon: Charlie Pitter, MD;  Location: [facility] NEURO ORS;  Service: Neurosurgery;  Laterality: Right;  . Left heart catheterization with coronary angiogram Left 12/03/2013    Procedure: LEFT HEART CATHETERIZATION WITH CORONARY ANGIOGRAM;  Surgeon: Leonie Man, MD;  Location[facility] CATH LAB;  Service: Cardiovascular;  Laterality: Left;  . Percutaneous coronary stent intervention (pci-s)  12/03/2013    Procedure: PERCUTANEOUS CORONARY STENT INTERVENTION (PCI-S);  Surgeon: Leonie Man, MD;  Location: [facility] CATH LAB;  Service: Cardiovascular;;  . Left heart catheterization with coronary angiogram N/A 01/26/2014    Procedure: LEFT HEART CATHETERIZATION WITH CORONARY ANGIOGRAM;  Surgeon: Jettie Booze, MD;  Location: [facility] CATH LAB;  Service: Cardiovascular;  Laterality: N/A;  . Left heart catheterization with coronary angiogram N/A 08/03/2014    Procedure: LEFT HEART CATHETERIZATION WITH CORONARY ANGIOGRAM;  Surgeon: Burnell Blanks, MD;  Location: [facility] CATH LAB;  Service: Cardiovascular;  Laterality: N/A;  . Permanent pacemaker insertion N/A 09/18/2014    Procedure: PERMANENT PACEMAKER INSERTION;  Surgeon: [name], MD;  Location: [facility] CATH LAB;  Service: Cardiovascular;  Laterality: N/A;  . Brain surgery  2015    hematoma evacuation  . Tee without cardioversion N/A 07/27/2015    Procedure: TRANSESOPHAGEAL ECHOCARDIOGRAM (TEE);  Surgeon: Lelon Perla, MD;  Location: [facility] ENDOSCOPY;  Service: Cardiovascular;  Laterality: N/A;  . Electrophysiologic study N/A 08/05/2015    Procedure: LEFT ATRIAL APPENDAGE OCCLUSION;  Surgeon: [name], MD;  Location: [facility] CV LAB;  Service: Cardiovascular;  Laterality: N/A;     Current Outpatient Prescriptions  Medication Sig Dispense Refill  . allopurinol (ZYLOPRIM) 100 MG tablet Take 100 mg by mouth daily.     [name] aspirin EC 81 MG tablet Take 1 tablet (81 mg total) by mouth daily.    [name] dexlansoprazole (DEXILANT) 60 MG capsule Take 1 capsule (60 mg total) by mouth daily. 30 capsule 0  . furosemide (LASIX) 20 MG tablet TAKE 3 TABLETS BY MOUTH DAILY 90 tablet 6  . HYDROcodone-acetaminophen (NORCO) 7.5-325 MG tablet Take 1 tablet by mouth every 6 (six) hours as needed for moderate pain. 60 tablet 0  . isosorbide mononitrate (IMDUR) 60 MG 24 hr tablet TAKE 1 TABLET BY MOUTH EVERY DAY 90 tablet 2  . levothyroxine (SYNTHROID, LEVOTHROID) 175 MCG tablet Take 1 tablet (175 mcg total) by mouth daily before breakfast. 30 tablet 3  . lidocaine (XYLOCAINE) 5 % ointment Apply 1 application topically as directed.    . metFORMIN (GLUCOPHAGE) 500 MG tablet Take 1 tablet (500 mg total) by mouth 2 (two) times daily with a meal. 60 tablet 3  . metoprolol tartrate (LOPRESSOR) 25 MG tablet Take 1 tablet (25 mg total) by mouth 2 (two) times daily. 180 tablet 1  . nitroGLYCERIN (NITROSTAT) 0.4 MG SL tablet Place 0.4 mg under the tongue every 5 (five) minutes as needed for chest pain (x 3 doses).    . ONE TOUCH ULTRA TEST test strip USE TO TEST ONCE DAILY 50 each 11  . potassium chloride SA (K-DUR,KLOR-CON) 20 MEQ tablet TAKE 1 TABLET BY MOUTH EVERY DAY 30 tablet 5  . RAPAFLO 8 MG CAPS capsule TAKE 1 CAPSULE(8 MG) BY MOUTH DAILY 30 capsule 3  . simvastatin (ZOCOR) 40 MG tablet Take 1 tablet (40 mg total) by mouth daily. 30 tablet 3  . traZODone (DESYREL) 50 MG tablet TAKE 1/2 TO 1 TABLET BY MOUTH AT BEDTIME AS NEEDED FOR SLEEP 30 tablet 3  . warfarin (COUMADIN) 5 MG tablet Take 1 tablet (5 mg total) by mouth daily. 30 tablet 1  . sotalol (BETAPACE) 80 MG tablet Take 1 tablet (80 mg total) by mouth 2 (two) times daily. 60 tablet 6   No current facility-administered medications for this visit.   Facility-Administered Medications Ordered in Other Visits  Medication Dose Route Frequency Provider Last Rate Last Dose  . 0.9 %   sodium chloride infusion   Intravenous Continuous [name], MD        Allergies:   Lactose intolerance (gi)   Social History:  The patient  reports that he quit smoking about 2 years ago. His smoking use included Cigarettes. He started smoking about 42 years ago. He has a 40 pack-year smoking history. He has never used smokeless tobacco. He reports that he does not drink alcohol or use illicit drugs.   Family History:  The patient's family history includes Coronary artery disease (age of onset: 14) in his father; Diabetes in his brother, mother, and sister; Diabetes type II in his father; Heart attack in his father; Heart attack (age of onset: 55) in his sister; Hypertension in his brother, father, mother, and sister; Lung cancer in his paternal uncle.    ROS:  Please see the history  of present illness.   All other systems are reviewed and negative.    PHYSICAL EXAM: VS:  BP 148/78 mmHg  Pulse 88  Ht 6' (1.829 m)  Wt 319 lb 1.9 oz (144.752 kg)  BMI 43.27 kg/m2 , BMI Body mass index is 43.27 kg/(m^2). GEN: Obese, well developed, in no acute distress HEENT: normal Neck: no JVD, carotid bruits, or masses Cardiac: RRR; 2/6 SEM Respiratory:  clear to auscultation bilaterally, normal work of breathing GI: obese, nontender, nondistended, + BS MS: no deformity or atrophy Skin: warm and dry  Neuro:  Strength and sensation are intact Psych: euthymic mood, full affect  EKG:  EKG is ordered today. EKG today shows SR, rate 82, QTc 485msec  Recent Labs: 12/19/2014: B Natriuretic Peptide 77.2 12/20/2014: ALT 15*; Magnesium 1.7 06/26/2015: TSH 34.00* 08/05/2015: Hemoglobin 16.7; Platelets 160 08/06/2015: BUN 12; Creatinine, Ser 1.37*; Potassium 3.6; Sodium 138    Lipid Panel     Component Value Date/Time   CHOL 127 12/20/2014 0430   TRIG 84 12/20/2014 0430   HDL 33* 12/20/2014 0430   CHOLHDL 3.8 12/20/2014 0430   VLDL 17 12/20/2014 0430   LDLCALC 77 12/20/2014 0430     Wt Readings  from Last 3 Encounters:  08/16/15 319 lb 1.9 oz (144.752 kg)  08/05/15 319 lb 3.6 oz (144.8 kg)  07/27/15 320 lb (145.151 kg)      Other studies Reviewed: Additional studies/ records that were reviewed today include: Dr Tanna Furry office notes, echo, cath reports   ASSESSMENT AND PLAN:  1.  Paroxysmal atrial fibrillation Doing well post Watchman Continue Warfarin and ASA 81mg  daily TEE 6 weeks post Watchman to evaluate for closure of LAA Start Sotalol 80mg  bid today for rhythm control. Follow up in 3 days for EKG.   2.  Morbid obesity Weight loss advised  3.  HTN Stable No change required today  4.  CAD No recent ischemic symptoms  5.  OSA Compliance with CPAP advised    Follow-up:  With EP NP following TEE   Current medicines are reviewed at length with the patient today.   The patient does not have concerns regarding his medicines.  The following changes were made today:  none  Labs/ tests ordered today include: pre-procedure labs, TEE 6 weeks post Zadie Cleverly MD  08/16/2015 9:25 AM     [facility] [address] [place] [address] [phone] (office) [phone] (fax)

## 2015-08-16 ENCOUNTER — Ambulatory Visit (INDEPENDENT_AMBULATORY_CARE_PROVIDER_SITE_OTHER): Payer: Medicare Other | Admitting: Internal Medicine

## 2015-08-16 ENCOUNTER — Encounter: Payer: Self-pay | Admitting: Internal Medicine

## 2015-08-16 ENCOUNTER — Ambulatory Visit (INDEPENDENT_AMBULATORY_CARE_PROVIDER_SITE_OTHER): Payer: Medicare Other | Admitting: Pharmacist

## 2015-08-16 VITALS — BP 148/78 | HR 88 | Ht 72.0 in | Wt 319.1 lb

## 2015-08-16 DIAGNOSIS — I495 Sick sinus syndrome: Secondary | ICD-10-CM

## 2015-08-16 DIAGNOSIS — G4733 Obstructive sleep apnea (adult) (pediatric): Secondary | ICD-10-CM | POA: Diagnosis not present

## 2015-08-16 DIAGNOSIS — I10 Essential (primary) hypertension: Secondary | ICD-10-CM

## 2015-08-16 DIAGNOSIS — I480 Paroxysmal atrial fibrillation: Secondary | ICD-10-CM

## 2015-08-16 DIAGNOSIS — Z9989 Dependence on other enabling machines and devices: Secondary | ICD-10-CM

## 2015-08-16 DIAGNOSIS — I679 Cerebrovascular disease, unspecified: Secondary | ICD-10-CM | POA: Diagnosis not present

## 2015-08-16 LAB — MAGNESIUM: Magnesium: 1.7 mg/dL (ref 1.5–2.5)

## 2015-08-16 LAB — CUP PACEART INCLINIC DEVICE CHECK
Battery Remaining Longevity: 121.2
Battery Voltage: 2.99 V
Brady Statistic RA Percent Paced: 11 %
Brady Statistic RV Percent Paced: 24 %
Date Time Interrogation Session: 20170313120052
Implantable Lead Implant Date: 20160415
Implantable Lead Implant Date: 20160415
Implantable Lead Location: 753859
Implantable Lead Location: 753860
Lead Channel Impedance Value: 450 Ohm
Lead Channel Impedance Value: 612.5 Ohm
Lead Channel Pacing Threshold Amplitude: 0.25 V
Lead Channel Pacing Threshold Amplitude: 0.25 V
Lead Channel Pacing Threshold Amplitude: 1 V
Lead Channel Pacing Threshold Amplitude: 1 V
Lead Channel Pacing Threshold Pulse Width: 0.5 ms
Lead Channel Pacing Threshold Pulse Width: 0.5 ms
Lead Channel Pacing Threshold Pulse Width: 0.5 ms
Lead Channel Pacing Threshold Pulse Width: 0.5 ms
Lead Channel Sensing Intrinsic Amplitude: 12 mV
Lead Channel Sensing Intrinsic Amplitude: 5 mV
Lead Channel Setting Pacing Amplitude: 2 V
Lead Channel Setting Pacing Amplitude: 2.5 V
Lead Channel Setting Pacing Pulse Width: 0.5 ms
Lead Channel Setting Sensing Sensitivity: 2 mV
Pulse Gen Model: 2240
Pulse Gen Serial Number: 7756161

## 2015-08-16 LAB — BASIC METABOLIC PANEL
BUN: 14 mg/dL (ref 7–25)
CO2: 27 mmol/L (ref 20–31)
Calcium: 9.3 mg/dL (ref 8.6–10.3)
Chloride: 103 mmol/L (ref 98–110)
Creat: 1.1 mg/dL (ref 0.70–1.33)
Glucose, Bld: 93 mg/dL (ref 65–99)
Potassium: 4 mmol/L (ref 3.5–5.3)
Sodium: 137 mmol/L (ref 135–146)

## 2015-08-16 LAB — POCT INR: INR: 1.8

## 2015-08-16 MED ORDER — SOTALOL HCL 80 MG PO TABS: 80.0000 mg | ORAL_TABLET | Freq: Two times a day (BID) | ORAL | Status: DC

## 2015-08-16 NOTE — Patient Instructions (Addendum)
Medication Instructions: Your physician has recommended you make the following change in your medication:  1) Start Sotalol 80 mg twice daily  Labwork: Your physician recommends that you return for lab work in: BMP/MAG    Testing/Procedures: Your physician has requested that you have a TEE. During a TEE, sound waves are used to create images of your heart. It provides your doctor with information about the size and shape of your heart and how well your heart's chambers and valves are working. In this test, a transducer is attached to the end of a flexible tube that's guided down your throat and into your esophagus (the tube leading from you mouth to your stomach) to get a more detailed image of your heart. You are not awake for the procedure. Please see the instruction sheet given to you today. For further information please visit [email or web address].   Please arrive at the [facility] on 09/15/15 at 8:00am for your TEE   Do not eat or drink after midnight the night before your procedure.  Do not take medications the morning of your procedure.  You will need someone to drive you home after the procedure.  Follow-Up: Your physician recommends that you schedule a follow-up appointment on Wed for EKG only -----starting Sotalol 08/16/15   Your physician recommends that you schedule a follow-up appointment in: 8 weeks with Dr Allred(Amber to see)    Any Other Special Instructions Will Be Listed Below (If Applicable).     If you need a refill on your cardiac medications before your next appointment, please call your pharmacy.

## 2015-08-18 ENCOUNTER — Ambulatory Visit (INDEPENDENT_AMBULATORY_CARE_PROVIDER_SITE_OTHER): Payer: Medicare Other | Admitting: Nurse Practitioner

## 2015-08-18 ENCOUNTER — Encounter: Payer: Self-pay | Admitting: Nurse Practitioner

## 2015-08-18 VITALS — HR 74

## 2015-08-18 DIAGNOSIS — I480 Paroxysmal atrial fibrillation: Secondary | ICD-10-CM

## 2015-08-18 LAB — BASIC METABOLIC PANEL WITH GFR
BUN: 15 mg/dL (ref 7–25)
CO2: 25 mmol/L (ref 20–31)
Calcium: 9.7 mg/dL (ref 8.6–10.3)
Chloride: 102 mmol/L (ref 98–110)
Creat: 1.31 mg/dL (ref 0.70–1.33)
Glucose, Bld: 106 mg/dL — ABNORMAL HIGH (ref 65–99)
Potassium: 4 mmol/L (ref 3.5–5.3)
Sodium: 139 mmol/L (ref 135–146)

## 2015-08-18 LAB — MAGNESIUM: Magnesium: 1.6 mg/dL (ref 1.5–2.5)

## 2015-08-18 NOTE — Patient Instructions (Signed)
Medication Instructions:  Your physician recommends that you continue on your current medications as directed. Please refer to the Current Medication list given to you today.   Labwork: Your physician recommends that you return for lab work today: BMP/MAG.   Testing/Procedures: None ordered   Follow-Up: Keep follow up as scheduled  Any Other Special Instructions Will Be Listed Below (If Applicable).     If you need a refill on your cardiac medications before your next appointment, please call your pharmacy.

## 2015-08-18 NOTE — Progress Notes (Signed)
Patient here for an EKG after starting Sotalol 80 mg bid on 08/16/15.  He was unable to pick up due to pharmacy having to order and has had 3 doses as of now.  Chanetta Marshall, NP reviewed and will continue as planned.  QTC-492 ms

## 2015-08-18 NOTE — Progress Notes (Signed)
QTc 46msec by my calculation today Continue Sotalol BMET, Mg today  Chanetta Marshall, NP 08/18/2015 10:55 AM

## 2015-08-19 ENCOUNTER — Other Ambulatory Visit: Payer: Self-pay | Admitting: Nurse Practitioner

## 2015-08-19 DIAGNOSIS — M25561 Pain in right knee: Secondary | ICD-10-CM | POA: Diagnosis not present

## 2015-08-19 NOTE — Addendum Note (Signed)
Addended by: Freada Bergeron on: 08/19/2015 03:22 PM   Modules accepted: Orders

## 2015-08-23 ENCOUNTER — Ambulatory Visit (INDEPENDENT_AMBULATORY_CARE_PROVIDER_SITE_OTHER): Payer: Medicare Other | Admitting: *Deleted

## 2015-08-23 DIAGNOSIS — I679 Cerebrovascular disease, unspecified: Secondary | ICD-10-CM | POA: Diagnosis not present

## 2015-08-23 DIAGNOSIS — I480 Paroxysmal atrial fibrillation: Secondary | ICD-10-CM | POA: Diagnosis not present

## 2015-08-23 LAB — POCT INR: INR: 1.9

## 2015-08-26 ENCOUNTER — Other Ambulatory Visit: Payer: Self-pay

## 2015-08-26 NOTE — Addendum Note (Signed)
Addended by: Freada Bergeron on: 08/26/2015 05:45 PM   Modules accepted: Orders

## 2015-08-26 NOTE — Patient Outreach (Signed)
[facility]) Care Management  [place]  08/26/2015   Peter Dunlap 1956-04-23 [id number]  Subjective: Telephone call to patient for monthly outreach. Patient reports he is doing good.  Patient reports he had his watchmen procedure completed and will go for follow up on 09-15-15. Patient reports he had some problems with his heart rate going fast but was on new medication and is now better. Patient reports he is supposed to be on coumadin for 45 days after his procedure if things go as planned.  Patient states he had fluid drawn from his knee and it feels a lot better.  He denies pain and reports he is ambulating better. Patient reports his blood sugars have been doing good.  He reports that last blood sugar was 120.  Discussed with patient diet control of diabetes and signs of hypoglycemia. He verbalized understanding. No concerns.   Objective:   Current Medications:  Current Outpatient Prescriptions  Medication Sig Dispense Refill  . allopurinol (ZYLOPRIM) 100 MG tablet Take 100 mg by mouth daily.    [name] aspirin EC 81 MG tablet Take 1 tablet (81 mg total) by mouth daily.    [name] dexlansoprazole (DEXILANT) 60 MG capsule Take 1 capsule (60 mg total) by mouth daily. 30 capsule 0  . furosemide (LASIX) 20 MG tablet TAKE 3 TABLETS BY MOUTH DAILY 90 tablet 6  . HYDROcodone-acetaminophen (NORCO) 7.5-325 MG tablet Take 1 tablet by mouth every 6 (six) hours as needed for moderate pain. 60 tablet 0  . isosorbide mononitrate (IMDUR) 60 MG 24 hr tablet TAKE 1 TABLET BY MOUTH EVERY DAY 90 tablet 2  . levothyroxine (SYNTHROID, LEVOTHROID) 175 MCG tablet Take 1 tablet (175 mcg total) by mouth daily before breakfast. 30 tablet 3  . lidocaine (XYLOCAINE) 5 % ointment Apply 1 application topically as directed.    . metFORMIN (GLUCOPHAGE) 500 MG tablet Take 1 tablet (500 mg total) by mouth 2 (two) times daily with a meal. 60 tablet 3  . metoprolol tartrate (LOPRESSOR) 25 MG tablet Take 1  tablet (25 mg total) by mouth 2 (two) times daily. 180 tablet 1  . nitroGLYCERIN (NITROSTAT) 0.4 MG SL tablet Place 0.4 mg under the tongue every 5 (five) minutes as needed for chest pain (x 3 doses).    . ONE TOUCH ULTRA TEST test strip USE TO TEST ONCE DAILY 50 each 11  . potassium chloride SA (K-DUR,KLOR-CON) 20 MEQ tablet TAKE 1 TABLET BY MOUTH EVERY DAY 30 tablet 5  . RAPAFLO 8 MG CAPS capsule TAKE 1 CAPSULE(8 MG) BY MOUTH DAILY 30 capsule 3  . simvastatin (ZOCOR) 40 MG tablet Take 1 tablet (40 mg total) by mouth daily. 30 tablet 3  . sotalol (BETAPACE) 80 MG tablet Take 1 tablet (80 mg total) by mouth 2 (two) times daily. 60 tablet 6  . traZODone (DESYREL) 50 MG tablet TAKE 1/2 TO 1 TABLET BY MOUTH AT BEDTIME AS NEEDED FOR SLEEP 30 tablet 3  . warfarin (COUMADIN) 5 MG tablet Take 1 tablet (5 mg total) by mouth daily. 30 tablet 1   No current facility-administered medications for this visit.   Facility-Administered Medications Ordered in Other Visits  Medication Dose Route Frequency Provider Last Rate Last Dose  . 0.9 %  sodium chloride infusion   Intravenous Continuous [name], MD        Functional Status:  In your present state of health, do you have any difficulty performing the following activities: 08/05/2015 08/05/2015  Hearing? - N  Vision? - N  Difficulty concentrating or making decisions? - N  Walking or climbing stairs? - N  Dressing or bathing? - N  Doing errands, shopping? N -  Preparing Food and eating ? - -  Using the Toilet? - -  In the past six months, have you accidently leaked urine? - -  Do you have problems with loss of bowel control? - -  Managing your Medications? - -  Managing your Finances? - -  Housekeeping or managing your Housekeeping? - -    Fall/Depression Screening: PHQ 2/9 Scores 08/26/2015 07/29/2015 07/20/2015 07/20/2015 07/05/2015 01/08/2015 01/01/2014  PHQ - 2 Score 0 0 0 0 0 0 0    Assessment: Patient continues to benefit from health coach  outreach for disease management and support.   Plan:  [facility] CM Care Plan Problem One        Most Recent Value   Care Plan Problem One  Diabetes Knowledge deficit   Role Documenting the Problem One  Health Coach   Care Plan for Problem One  Active   THN Long Term Goal (31-90 days)  Patient will verbalize care for diabetes such as diet  and blood sugar monitoring within the next 90 days.   THN Long Term Goal Start Date  07/29/15   Interventions for Problem One [place] reviewed with patient foods high in carbohydrates and reasons to monitor blood sugar regularly.    THN CM Short Term Goal #1 (0-30 days)  Patient will name at least three signs of hypoglycemia within 30 days   THN CM Short Term Goal #1 Start Date  08/26/15 Barrie Folk continued]   Interventions for Short Term Goal #1  RN Health Coach reviewed signs of hypoglycemia with patient. Reviewed snacks to consume to quickly increase blood sugar.   THN CM Short Term Goal #2 (0-30 days)  Patient will monitor and record blood sugars at least 3 times a week within the next 30 days.    THN CM Short Term Goal #2 Start Date  07/29/15 [goal continued]   Interventions for Short Term Goal #2  [place] reviewed with patient the importance of monitoring blood sugar and normal blood sugar ranges.      RN Health Coach will contact patient within one month and patient agrees to next outreach.    [name], RN, MSN [place] [phone]

## 2015-08-27 ENCOUNTER — Telehealth: Payer: Self-pay | Admitting: *Deleted

## 2015-08-27 MED ORDER — MAGNESIUM OXIDE 400 MG PO CAPS: 400.0000 mg | ORAL_CAPSULE | Freq: Two times a day (BID) | ORAL | Status: DC

## 2015-08-27 NOTE — Telephone Encounter (Signed)
-----   Message from Patsey Berthold, NP sent at 08/19/2015  7:22 AM EDT ----- Please notify patient of results.  Start Mag-Ox 400mg  bid for low magnesium.  Thank you!

## 2015-08-30 ENCOUNTER — Ambulatory Visit (INDEPENDENT_AMBULATORY_CARE_PROVIDER_SITE_OTHER): Payer: Medicare Other | Admitting: *Deleted

## 2015-08-30 DIAGNOSIS — I679 Cerebrovascular disease, unspecified: Secondary | ICD-10-CM

## 2015-08-30 DIAGNOSIS — I480 Paroxysmal atrial fibrillation: Secondary | ICD-10-CM

## 2015-08-30 LAB — POCT INR: INR: 2

## 2015-09-03 DIAGNOSIS — M25561 Pain in right knee: Secondary | ICD-10-CM | POA: Diagnosis not present

## 2015-09-06 ENCOUNTER — Ambulatory Visit (INDEPENDENT_AMBULATORY_CARE_PROVIDER_SITE_OTHER): Payer: Medicare Other

## 2015-09-06 DIAGNOSIS — I679 Cerebrovascular disease, unspecified: Secondary | ICD-10-CM

## 2015-09-06 DIAGNOSIS — I480 Paroxysmal atrial fibrillation: Secondary | ICD-10-CM

## 2015-09-06 LAB — POCT INR: INR: 2.5

## 2015-09-13 ENCOUNTER — Ambulatory Visit (INDEPENDENT_AMBULATORY_CARE_PROVIDER_SITE_OTHER): Payer: Medicare Other | Admitting: Pharmacist

## 2015-09-13 DIAGNOSIS — I480 Paroxysmal atrial fibrillation: Secondary | ICD-10-CM

## 2015-09-13 DIAGNOSIS — I679 Cerebrovascular disease, unspecified: Secondary | ICD-10-CM

## 2015-09-13 LAB — POCT INR: INR: 2.4

## 2015-09-14 ENCOUNTER — Other Ambulatory Visit: Payer: Self-pay | Admitting: Nurse Practitioner

## 2015-09-15 ENCOUNTER — Ambulatory Visit (HOSPITAL_COMMUNITY)
Admission: RE | Admit: 2015-09-15 | Discharge: 2015-09-15 | Disposition: A | Discharge status: Home / Self Care | Payer: Medicare Other | Source: Ambulatory Visit | Attending: Cardiovascular Disease | Admitting: Cardiovascular Disease

## 2015-09-15 ENCOUNTER — Encounter (HOSPITAL_COMMUNITY): Payer: Self-pay | Admitting: *Deleted

## 2015-09-15 ENCOUNTER — Ambulatory Visit (HOSPITAL_BASED_OUTPATIENT_CLINIC_OR_DEPARTMENT_OTHER)
Admission: RE | Admit: 2015-09-15 | Discharge: 2015-09-15 | Disposition: A | Discharge status: Home / Self Care | Payer: Medicare Other | Source: Ambulatory Visit | Attending: Nurse Practitioner | Admitting: Nurse Practitioner

## 2015-09-15 ENCOUNTER — Encounter (HOSPITAL_COMMUNITY)
Admission: RE | Disposition: A | Discharge status: Home / Self Care | Payer: Self-pay | Source: Ambulatory Visit | Attending: Cardiovascular Disease

## 2015-09-15 DIAGNOSIS — G4733 Obstructive sleep apnea (adult) (pediatric): Secondary | ICD-10-CM | POA: Diagnosis not present

## 2015-09-15 DIAGNOSIS — Z6841 Body Mass Index (BMI) 40.0 and over, adult: Secondary | ICD-10-CM | POA: Insufficient documentation

## 2015-09-15 DIAGNOSIS — I2510 Atherosclerotic heart disease of native coronary artery without angina pectoris: Secondary | ICD-10-CM | POA: Diagnosis not present

## 2015-09-15 DIAGNOSIS — Z8249 Family history of ischemic heart disease and other diseases of the circulatory system: Secondary | ICD-10-CM | POA: Insufficient documentation

## 2015-09-15 DIAGNOSIS — I10 Essential (primary) hypertension: Secondary | ICD-10-CM | POA: Diagnosis not present

## 2015-09-15 DIAGNOSIS — Z7982 Long term (current) use of aspirin: Secondary | ICD-10-CM | POA: Diagnosis not present

## 2015-09-15 DIAGNOSIS — I480 Paroxysmal atrial fibrillation: Secondary | ICD-10-CM | POA: Insufficient documentation

## 2015-09-15 DIAGNOSIS — Z955 Presence of coronary angioplasty implant and graft: Secondary | ICD-10-CM | POA: Diagnosis not present

## 2015-09-15 DIAGNOSIS — I340 Nonrheumatic mitral (valve) insufficiency: Secondary | ICD-10-CM | POA: Diagnosis not present

## 2015-09-15 DIAGNOSIS — Z7984 Long term (current) use of oral hypoglycemic drugs: Secondary | ICD-10-CM | POA: Insufficient documentation

## 2015-09-15 DIAGNOSIS — I4891 Unspecified atrial fibrillation: Secondary | ICD-10-CM

## 2015-09-15 DIAGNOSIS — E785 Hyperlipidemia, unspecified: Secondary | ICD-10-CM | POA: Diagnosis not present

## 2015-09-15 DIAGNOSIS — Z87891 Personal history of nicotine dependence: Secondary | ICD-10-CM | POA: Diagnosis not present

## 2015-09-15 DIAGNOSIS — Z7901 Long term (current) use of anticoagulants: Secondary | ICD-10-CM | POA: Insufficient documentation

## 2015-09-15 HISTORY — PX: TEE WITHOUT CARDIOVERSION: SHX5443

## 2015-09-15 LAB — GLUCOSE, CAPILLARY: Glucose-Capillary: 87 mg/dL (ref 65–99)

## 2015-09-15 SURGERY — ECHOCARDIOGRAM, TRANSESOPHAGEAL
Anesthesia: Moderate Sedation

## 2015-09-15 MED ORDER — BUTAMBEN-TETRACAINE-BENZOCAINE 2-2-14 % EX AERO
INHALATION_SPRAY | CUTANEOUS | Status: DC | PRN
  Administered 2015-09-15: 2 via TOPICAL

## 2015-09-15 MED ORDER — SODIUM CHLORIDE 0.9 % IV SOLN
INTRAVENOUS | Status: DC
  Administered 2015-09-15: 500 mL via INTRAVENOUS

## 2015-09-15 MED ORDER — HYDRALAZINE HCL 20 MG/ML IJ SOLN
INTRAMUSCULAR | Status: AC
  Filled 2015-09-15: qty 1

## 2015-09-15 MED ORDER — MIDAZOLAM HCL 10 MG/2ML IJ SOLN
INTRAMUSCULAR | Status: DC | PRN
  Administered 2015-09-15: 1 mg via INTRAVENOUS
  Administered 2015-09-15: 2 mg via INTRAVENOUS
  Administered 2015-09-15: 1 mg via INTRAVENOUS

## 2015-09-15 MED ORDER — MIDAZOLAM HCL 5 MG/ML IJ SOLN
INTRAMUSCULAR | Status: AC
  Filled 2015-09-15: qty 2

## 2015-09-15 MED ORDER — FENTANYL CITRATE (PF) 100 MCG/2ML IJ SOLN
INTRAMUSCULAR | Status: DC | PRN
  Administered 2015-09-15 (×3): 25 ug via INTRAVENOUS

## 2015-09-15 MED ORDER — HYDRALAZINE HCL 20 MG/ML IJ SOLN
INTRAMUSCULAR | Status: DC | PRN
  Administered 2015-09-15: 10 mg via INTRAVENOUS

## 2015-09-15 MED ORDER — FENTANYL CITRATE (PF) 100 MCG/2ML IJ SOLN
INTRAMUSCULAR | Status: AC
  Filled 2015-09-15: qty 2

## 2015-09-15 NOTE — CV Procedure (Signed)
    Transesophageal Echocardiogram Note  Peter Dunlap [id number] 11-15-1955  Procedure: Transesophageal Echocardiogram Indications: atrial fib,  S/p Watchman   Procedure Details Consent: Obtained Time Out: Verified patient identification, verified procedure, site/side was marked, verified correct patient position, special equipment/implants available, Radiology Safety Procedures followed,  medications/allergies/relevent history reviewed, required imaging and test results available.  Performed  Medications:  During this procedure the patient is administered a total of Versed 4  mg and Fentanyl 75 mcg  to achieve and maintain moderate conscious sedation.  The patient's heart rate, blood pressure, and oxygen saturation are monitored continuously during the procedure. The period of conscious sedation is 30 minutes, of which I was present face-to-face 100% of this time.  Left Ventrical:  Normal LV   Mitral Valve: trivial MR  Aortic Valve: calcified,  No AI  Tricuspid Valve: not vis  Pulmonic Valve: not vis  Left Atrium/ Left atrial appendage: the Watchman is in place.   There is no peri-watchman blood flow.   The LAA appears to be occluded   Atrial septum: no evidence of PFO  Aorta: not vis    Complications: No apparent complications Patient did tolerate procedure well.   Thayer Headings, Peter Bonito., MD, [facility] 09/15/2015, 9:25 AM

## 2015-09-15 NOTE — Interval H&P Note (Signed)
History and Physical Interval Note:  09/15/2015 8:57 AM  Peter Dunlap  has presented today for surgery, with the diagnosis of A FIB   The various methods of treatment have been discussed with the patient and family. After consideration of risks, benefits and other options for treatment, the patient has consented to  Procedure(s): TRANSESOPHAGEAL ECHOCARDIOGRAM (TEE) (N/A) as a surgical intervention .  The patient's history has been reviewed, patient examined, no change in status, stable for surgery.  I have reviewed the patient's chart and labs.  Questions were answered to the patient's satisfaction.     [place], Wonda Cheng

## 2015-09-15 NOTE — Discharge Instructions (Signed)

## 2015-09-15 NOTE — H&P (Signed)
ID: Peter Dunlap, DOB Aug 27, 1955, MRN [id number]  PCP: [name], MD Primary Electrophysiologist: Lovena Le  CC: follow up Watchman implant   History of Present Illness: Peter Dunlap is a 60 y.o. male who presents today post Watchman implant. Since discharge, he denies symptoms of shortness of breath, orthopnea, PND, lower extremity edema, claudication, dizziness, presyncope, syncope, bleeding, or neurologic sequela. The patient is tolerating medications without difficulties and is otherwise without complaint today. He has had no groin complications post procedure.   Past Medical History  Diagnosis Date  . Hypertension   . Arteriosclerotic cardiovascular disease (ASCVD) 2005    catheterization in 10/2010:50% mid LAD, diffuse distal disease, circumflex irregularities, large dominant RCA with a 50% ostial, 70% distal, 60% posterolateral and 70% PDA; normal EF  . Cerebrovascular disease 2010    R. carotid endarterectomy; Duplex in 10/2010-widely patent ICAs, subtotal left vertebral-not thought to be contributing to symptoms  . Hyperlipidemia   . Obesity   . Tobacco abuse     Quit 2014  . Benign prostatic hypertrophy   . Cervical spine disease     CT in 2012-advanced degeneration and spondylosis with moderate spinal stenosis at C3-C6  . H/O: substance abuse     Cocaine, marijuana, alcohol. Quit 2013.   [name] Gastroesophageal reflux disease   . Depression   . Erectile dysfunction   . H/O hiatal hernia   . Arthritis   . Non-ST elevation myocardial infarction (NSTEMI), initial episode of care [facility]) 12/02/2013    DES LAD  . Thyroid disease   . Sleep apnea     CPAP  . Tachy-brady syndrome ([place])     a. s/p STJ dual chamber PPM    Past Surgical History  Procedure Laterality Date  . Radiofrequency ablation  2005    for PSVT  . Coronary angioplasty with stent placement   12/03/2013    LAD 90%-->0% W/ Promus Premier DES 3.0 mm x 16 mm, CFX OK, RCA 40%, EF 70-75%  . Carotid endarterectomy Right Feb. 25, 2010    CEA  . Trudee Kuster hole Right 04/13/2014    Procedure: Haskell Flirt; Surgeon: Charlie Pitter, MD; Location: [place] NEURO ORS; Service: Neurosurgery; Laterality: Right;  . Left heart catheterization with coronary angiogram Left 12/03/2013    Procedure: LEFT HEART CATHETERIZATION WITH CORONARY ANGIOGRAM; Surgeon: Leonie Man, MD; Location: [facility] CATH LAB; Service: Cardiovascular; Laterality: Left;  . Percutaneous coronary stent intervention (pci-s)  12/03/2013    Procedure: PERCUTANEOUS CORONARY STENT INTERVENTION (PCI-S); Surgeon: Leonie Man, MD; Location: [facility] CATH LAB; Service: Cardiovascular;;  . Left heart catheterization with coronary angiogram N/A 01/26/2014    Procedure: LEFT HEART CATHETERIZATION WITH CORONARY ANGIOGRAM; Surgeon: Jettie Booze, MD; Location: [facility] CATH LAB; Service: Cardiovascular; Laterality: N/A;  . Left heart catheterization with coronary angiogram N/A 08/03/2014    Procedure: LEFT HEART CATHETERIZATION WITH CORONARY ANGIOGRAM; Surgeon: Burnell Blanks, MD; Location: [facility] CATH LAB; Service: Cardiovascular; Laterality: N/A;  . Permanent pacemaker insertion N/A 09/18/2014    Procedure: PERMANENT PACEMAKER INSERTION; Surgeon: [name], MD; Location: [facility] CATH LAB; Service: Cardiovascular; Laterality: N/A;  . Brain surgery  2015    hematoma evacuation  . Tee without cardioversion N/A 07/27/2015    Procedure: TRANSESOPHAGEAL ECHOCARDIOGRAM (TEE); Surgeon: Lelon Perla, MD; Location: [facility] ENDOSCOPY; Service: Cardiovascular; Laterality: N/A;  . Electrophysiologic study N/A 08/05/2015    Procedure: LEFT ATRIAL APPENDAGE OCCLUSION; Surgeon: [name], MD; Location: [place] CV LAB; Service: Cardiovascular; Laterality: N/A;  Current Outpatient Prescriptions  Medication Sig Dispense Refill  . allopurinol (ZYLOPRIM) 100 MG tablet Take 100 mg by mouth daily.    [name] aspirin EC 81 MG tablet Take 1 tablet (81 mg total) by mouth daily.    [name] dexlansoprazole (DEXILANT) 60 MG capsule Take 1 capsule (60 mg total) by mouth daily. 30 capsule 0  . furosemide (LASIX) 20 MG tablet TAKE 3 TABLETS BY MOUTH DAILY 90 tablet 6  . HYDROcodone-acetaminophen (NORCO) 7.5-325 MG tablet Take 1 tablet by mouth every 6 (six) hours as needed for moderate pain. 60 tablet 0  . isosorbide mononitrate (IMDUR) 60 MG 24 hr tablet TAKE 1 TABLET BY MOUTH EVERY DAY 90 tablet 2  . levothyroxine (SYNTHROID, LEVOTHROID) 175 MCG tablet Take 1 tablet (175 mcg total) by mouth daily before breakfast. 30 tablet 3  . lidocaine (XYLOCAINE) 5 % ointment Apply 1 application topically as directed.    . metFORMIN (GLUCOPHAGE) 500 MG tablet Take 1 tablet (500 mg total) by mouth 2 (two) times daily with a meal. 60 tablet 3  . metoprolol tartrate (LOPRESSOR) 25 MG tablet Take 1 tablet (25 mg total) by mouth 2 (two) times daily. 180 tablet 1  . nitroGLYCERIN (NITROSTAT) 0.4 MG SL tablet Place 0.4 mg under the tongue every 5 (five) minutes as needed for chest pain (x 3 doses).    . ONE TOUCH ULTRA TEST test strip USE TO TEST ONCE DAILY 50 each 11  . potassium chloride SA (K-DUR,KLOR-CON) 20 MEQ tablet TAKE 1 TABLET BY MOUTH EVERY DAY 30 tablet 5  . RAPAFLO 8 MG CAPS capsule TAKE 1 CAPSULE(8 MG) BY MOUTH DAILY 30 capsule 3  . simvastatin (ZOCOR) 40 MG tablet Take 1 tablet (40 mg total) by mouth daily. 30 tablet 3  . traZODone (DESYREL) 50 MG tablet TAKE 1/2 TO 1 TABLET BY MOUTH AT BEDTIME AS NEEDED FOR SLEEP 30 tablet 3  . warfarin (COUMADIN) 5 MG tablet Take 1 tablet (5 mg total) by mouth daily. 30 tablet 1  . sotalol (BETAPACE) 80 MG tablet Take 1 tablet (80 mg total) by  mouth 2 (two) times daily. 60 tablet 6   No current facility-administered medications for this visit.   Facility-Administered Medications Ordered in Other Visits  Medication Dose Route Frequency Provider Last Rate Last Dose  . 0.9 % sodium chloride infusion  Intravenous Continuous [name], MD      Allergies: Lactose intolerance (gi)   Social History: The patient  reports that he quit smoking about 2 years ago. His smoking use included Cigarettes. He started smoking about 42 years ago. He has a 40 pack-year smoking history. He has never used smokeless tobacco. He reports that he does not drink alcohol or use illicit drugs.   Family History: The patient's family history includes Coronary artery disease (age of onset: 29) in his father; Diabetes in his brother, mother, and sister; Diabetes type II in his father; Heart attack in his father; Heart attack (age of onset: 71) in his sister; Hypertension in his brother, father, mother, and sister; Lung cancer in his paternal uncle.    ROS: Please see the history of present illness. All other systems are reviewed and negative.    PHYSICAL EXAM: VS: BP 148/78 mmHg  Pulse 88  Ht 6' (1.829 m)  Wt 319 lb 1.9 oz (144.752 kg)  BMI 43.27 kg/m2 , BMI Body mass index is 43.27 kg/(m^2). GEN: Obese, well developed, in no acute distress  HEENT: normal  Neck:  no JVD, carotid bruits, or masses Cardiac: RRR; 2/6 SEM Respiratory: clear to auscultation bilaterally, normal work of breathing GI: obese, nontender, nondistended, + BS MS: no deformity or atrophy  Skin: warm and dry  Neuro: Strength and sensation are intact Psych: euthymic mood, full affect  EKG: EKG is ordered today. EKG today shows SR, rate 82, QTc 43msec  Recent Labs: 12/19/2014: B Natriuretic Peptide 77.2 12/20/2014: ALT 15*; Magnesium 1.7 06/26/2015: TSH 34.00* 08/05/2015: Hemoglobin 16.7; Platelets 160 08/06/2015: BUN 12; Creatinine, Ser  1.37*; Potassium 3.6; Sodium 138    Lipid Panel   Labs (Brief)       Component Value Date/Time   CHOL 127 12/20/2014 0430   TRIG 84 12/20/2014 0430   HDL 33* 12/20/2014 0430   CHOLHDL 3.8 12/20/2014 0430   VLDL 17 12/20/2014 0430   LDLCALC 77 12/20/2014 0430       Wt Readings from Last 3 Encounters:  08/16/15 319 lb 1.9 oz (144.752 kg)  08/05/15 319 lb 3.6 oz (144.8 kg)  07/27/15 320 lb (145.151 kg)      Other studies Reviewed: Additional studies/ records that were reviewed today include: Dr Tanna Furry office notes, echo, cath reports   ASSESSMENT AND PLAN:  1. Paroxysmal atrial fibrillation Doing well post Watchman Continue Warfarin and ASA 81mg  daily TEE  Start Sotalol 80mg  bid today for rhythm control. Follow up in 3 days for EKG.   2. Morbid obesity Weight loss advised  3. HTN Stable No change required today  4. CAD No recent ischemic symptoms  5. OSA Compliance with CPAP advised

## 2015-09-16 ENCOUNTER — Encounter (HOSPITAL_COMMUNITY): Payer: Self-pay | Admitting: Cardiovascular Disease

## 2015-09-16 ENCOUNTER — Other Ambulatory Visit: Payer: Self-pay

## 2015-09-16 NOTE — Patient Outreach (Signed)
[facility]) Care Management  09/16/2015  ZARIEL BOLING Nov 23, 1955 [id number]   Telephone call to patient for monthly call.  Patient states he is driving right now and would call back in about 1 hour.   Plan: RN Health Coach will wait patient return call.  If no return call, RN health Coach will contact within 1-2 weeks.  [name], RN, MSN [place] [phone]

## 2015-09-20 ENCOUNTER — Telehealth: Payer: Self-pay | Admitting: Internal Medicine

## 2015-09-20 NOTE — Telephone Encounter (Signed)
Spoke with wife - aware that I will review TEE images tomorrow and call back with med changes.  Chanetta Marshall, NP 09/20/2015 1:11 PM

## 2015-09-20 NOTE — Telephone Encounter (Signed)
Pt had 6 week TEE s/p Watchman procedure. Will route to Dr. Rayann Heman to follow up with patient regarding Coumadin.

## 2015-09-20 NOTE — Telephone Encounter (Signed)
New Message:  Pt's wife is calling in wanting to know what to do with the pt's coumadin. Is this something he needs to continue to take or will he need to stop this. Please f/u with her

## 2015-09-21 ENCOUNTER — Ambulatory Visit: Payer: Self-pay | Admitting: Pharmacist

## 2015-09-21 DIAGNOSIS — I679 Cerebrovascular disease, unspecified: Secondary | ICD-10-CM

## 2015-09-21 DIAGNOSIS — I480 Paroxysmal atrial fibrillation: Secondary | ICD-10-CM

## 2015-09-21 MED ORDER — CLOPIDOGREL BISULFATE 75 MG PO TABS: 75.0000 mg | ORAL_TABLET | Freq: Every day | ORAL | Status: DC

## 2015-09-21 MED ORDER — ASPIRIN 325 MG PO TBEC: 325.0000 mg | DELAYED_RELEASE_TABLET | Freq: Every day | ORAL | Status: DC

## 2015-09-21 NOTE — Telephone Encounter (Signed)
TEE images reviewed. No leak around device and Watchman device well seated.  Spoke with patient's wife.  Discontinue Warfarin. Start Plavix 75mg  daily Increase ASA to 325mg  daily  Wife aware and agrees with plan. Rx sent to pharmacy for Plavix. Will keep appt with Dr Rayann Heman as scheduled.  Chanetta Marshall, NP 09/21/2015 12:56 PM

## 2015-09-28 ENCOUNTER — Other Ambulatory Visit: Payer: Self-pay

## 2015-09-28 NOTE — Patient Outreach (Signed)
[facility]) Care Management  [place]  09/28/2015   NATHANUAL RASK Jun 26, 1955 [id number]  Subjective: Telephone call to patient for monthly outreach.  Patient reports he is doing ok except for pain in his right knee from wet weather.  Patient reports he takes tylenol as needed and uses ice packs.  Patient reports that he is now off coumadin after his watchmen procedure and doing well with that. Patient reports blood sugar last check was 97.  Discussed maintaining diabetic diet in order to control sugars. Patient weighs over 300 lbs and asked about gastric procedure.  Advised patient to discuss with cardiologist first given his extensive cardiac history.  He verbalized understanding.  Advised him that health coach would send him some basic information on bariatric surgery. He verbalized understanding.      Objective:   Encounter Medications:  Outpatient Encounter Prescriptions as of 09/28/2015  Medication Sig Note  . allopurinol (ZYLOPRIM) 100 MG tablet Take 100 mg by mouth daily.   [name] aspirin EC 325 MG EC tablet Take 1 tablet (325 mg total) by mouth daily.   . clopidogrel (PLAVIX) 75 MG tablet Take 1 tablet (75 mg total) by mouth daily.   [name] dexlansoprazole (DEXILANT) 60 MG capsule Take 1 capsule (60 mg total) by mouth daily.   . furosemide (LASIX) 20 MG tablet TAKE 3 TABLETS BY MOUTH DAILY   . HYDROcodone-acetaminophen (NORCO) 7.5-325 MG tablet Take 1 tablet by mouth every 6 (six) hours as needed for moderate pain.   . isosorbide mononitrate (IMDUR) 60 MG 24 hr tablet TAKE 1 TABLET BY MOUTH EVERY DAY   . levothyroxine (SYNTHROID, LEVOTHROID) 175 MCG tablet Take 1 tablet (175 mcg total) by mouth daily before breakfast.   . lidocaine (XYLOCAINE) 5 % ointment Apply 1 application topically as directed. 08/16/2015: Received from: External Pharmacy Received Sig:   . Magnesium Oxide 400 MG CAPS Take 1 capsule (400 mg total) by mouth 2 (two) times daily.   . metFORMIN  (GLUCOPHAGE) 500 MG tablet Take 1 tablet (500 mg total) by mouth 2 (two) times daily with a meal.   . metoprolol tartrate (LOPRESSOR) 25 MG tablet Take 1 tablet (25 mg total) by mouth 2 (two) times daily.   . nitroGLYCERIN (NITROSTAT) 0.4 MG SL tablet Place 0.4 mg under the tongue every 5 (five) minutes as needed for chest pain (x 3 doses).   . ONE TOUCH ULTRA TEST test strip USE TO TEST ONCE DAILY   . potassium chloride SA (K-DUR,KLOR-CON) 20 MEQ tablet TAKE 1 TABLET BY MOUTH EVERY DAY   . RAPAFLO 8 MG CAPS capsule TAKE 1 CAPSULE(8 MG) BY MOUTH DAILY   . simvastatin (ZOCOR) 40 MG tablet Take 1 tablet (40 mg total) by mouth daily.   . sotalol (BETAPACE) 80 MG tablet Take 1 tablet (80 mg total) by mouth 2 (two) times daily.   . traZODone (DESYREL) 50 MG tablet TAKE 1/2 TO 1 TABLET BY MOUTH AT BEDTIME AS NEEDED FOR SLEEP    Facility-Administered Encounter Medications as of 09/28/2015  Medication  . 0.9 %  sodium chloride infusion    Functional Status:  In your present state of health, do you have any difficulty performing the following activities: 08/05/2015 08/05/2015  Hearing? - N  Vision? - N  Difficulty concentrating or making decisions? - N  Walking or climbing stairs? - N  Dressing or bathing? - N  Doing errands, shopping? N -  Preparing Food and eating ? - -  Using the Toilet? - -  In the past six months, have you accidently leaked urine? - -  Do you have problems with loss of bowel control? - -  Managing your Medications? - -  Managing your Finances? - -  Housekeeping or managing your Housekeeping? - -    Fall/Depression Screening: PHQ 2/9 Scores 09/28/2015 08/26/2015 07/29/2015 07/20/2015 07/20/2015 07/05/2015 01/08/2015  PHQ - 2 Score 0 0 0 0 0 0 0    Assessment:   Plan:  THN CM Care Plan Problem One        Most Recent Value   Care Plan Problem One  Diabetes Knowledge deficit   Role Documenting the Problem One  [place] for Problem One  Active   THN Long Term  Goal (31-90 days)  Patient will verbalize care for diabetes such as diet  and blood sugar monitoring within the next 90 days.   THN Long Term Goal Start Date  07/29/15   Interventions for Problem One [place] reinforced with patient foods high in carbohydrates and reasons to monitor blood sugar regularly.    THN CM Short Term Goal #1 (0-30 days)  Patient will name at least three signs of hypoglycemia within 30 days   THN CM Short Term Goal #1 Start Date  09/28/15 Barrie Folk continued]   Interventions for Short Term Goal #1  RN Health Coach reinforced signs of hypoglycemia with patient. Reviewed snacks to consume to quickly increase blood sugar.   THN CM Short Term Goal #2 (0-30 days)  Patient will monitor and record blood sugars at least 3 times a week within the next 30 days.    THN CM Short Term Goal #2 Start Date  09/28/15 [goal continued]   Interventions for Short Term Goal #2  [place] reinforced with patient the importance of monitoring blood sugar and normal blood sugar ranges.      RN Health Coach will send basic information on bariatric surgery to patient. RN Health Coach will contact patient within one month and patient agrees to next outreach.    [name], RN, MSN [place] [phone]

## 2015-10-04 ENCOUNTER — Ambulatory Visit: Payer: Medicare Other | Admitting: "Endocrinology

## 2015-10-08 ENCOUNTER — Other Ambulatory Visit: Payer: Self-pay | Admitting: Nurse Practitioner

## 2015-10-12 ENCOUNTER — Other Ambulatory Visit: Payer: Self-pay | Admitting: "Endocrinology

## 2015-10-12 DIAGNOSIS — E032 Hypothyroidism due to medicaments and other exogenous substances: Secondary | ICD-10-CM | POA: Diagnosis not present

## 2015-10-12 LAB — TSH: TSH: 1.78 mIU/L (ref 0.40–4.50)

## 2015-10-12 LAB — T4, FREE: Free T4: 1.4 ng/dL (ref 0.8–1.8)

## 2015-10-13 ENCOUNTER — Other Ambulatory Visit: Payer: Self-pay | Admitting: Family Medicine

## 2015-10-13 ENCOUNTER — Other Ambulatory Visit: Payer: Self-pay | Admitting: Internal Medicine

## 2015-10-14 ENCOUNTER — Other Ambulatory Visit: Payer: Self-pay

## 2015-10-14 NOTE — Patient Outreach (Signed)
[facility] [facility]) Care Management  [place]  10/14/2015   Peter Dunlap [birth year] [id number]  Subjective: Telephone call to patient for monthly outreach.  Patient reports he is doing good. He states he received information on bariatric surgery and will be discussing with heart doctor and primary doctor.  Patient reports his last sugar was 98. Discussed with patient importance of continuing to maintain diet low in carbohydrates and being active. Patient shares he was able to do some gardening and that he was able to walk around [place] recently. He states before he could not do that. Congratulated patient and encouraged him to keep it up.  He verbalized understanding.    Objective:   Encounter Medications:  Outpatient Encounter Prescriptions as of 10/14/2015  Medication Sig Note  . allopurinol (ZYLOPRIM) 100 MG tablet Take 100 mg by mouth daily.   [name] aspirin EC 325 MG EC tablet Take 1 tablet (325 mg total) by mouth daily.   . clopidogrel (PLAVIX) 75 MG tablet Take 1 tablet (75 mg total) by mouth daily.   . clopidogrel (PLAVIX) 75 MG tablet TAKE 1 TABLET(75 MG) BY MOUTH DAILY   . dexlansoprazole (DEXILANT) 60 MG capsule Take 1 capsule (60 mg total) by mouth daily.   . furosemide (LASIX) 20 MG tablet TAKE 3 TABLETS BY MOUTH DAILY   . HYDROcodone-acetaminophen (NORCO) 7.5-325 MG tablet Take 1 tablet by mouth every 6 (six) hours as needed for moderate pain.   . isosorbide mononitrate (IMDUR) 60 MG 24 hr tablet TAKE 1 TABLET BY MOUTH EVERY DAY   . levothyroxine (SYNTHROID, LEVOTHROID) 175 MCG tablet Take 1 tablet (175 mcg total) by mouth daily before breakfast.   . lidocaine (XYLOCAINE) 5 % ointment Apply 1 application topically as directed. 08/16/2015: Received from: External Pharmacy Received Sig:   . Magnesium Oxide 400 MG CAPS Take 1 capsule (400 mg total) by mouth 2 (two) times daily.   . metFORMIN (GLUCOPHAGE) 500 MG tablet Take 1 tablet (500 mg total) by mouth 2  (two) times daily with a meal.   . metoprolol tartrate (LOPRESSOR) 25 MG tablet TAKE 1 TABLET(25 MG) BY MOUTH TWICE DAILY   . nitroGLYCERIN (NITROSTAT) 0.4 MG SL tablet Place 0.4 mg under the tongue every 5 (five) minutes as needed for chest pain (x 3 doses).   . ONE TOUCH ULTRA TEST test strip USE TO TEST ONCE DAILY   . potassium chloride SA (K-DUR,KLOR-CON) 20 MEQ tablet TAKE 1 TABLET BY MOUTH EVERY DAY   . RAPAFLO 8 MG CAPS capsule TAKE 1 CAPSULE(8 MG) BY MOUTH DAILY   . simvastatin (ZOCOR) 40 MG tablet Take 1 tablet (40 mg total) by mouth daily.   . sotalol (BETAPACE) 80 MG tablet Take 1 tablet (80 mg total) by mouth 2 (two) times daily.   . traZODone (DESYREL) 50 MG tablet TAKE 1/2 TO 1 TABLET BY MOUTH AT BEDTIME AS NEEDED FOR SLEEP    Facility-Administered Encounter Medications as of 10/14/2015  Medication  . 0.9 %  sodium chloride infusion    Functional Status:  In your present state of health, do you have any difficulty performing the following activities: 08/05/2015 08/05/2015  Hearing? - N  Vision? - N  Difficulty concentrating or making decisions? - N  Walking or climbing stairs? - N  Dressing or bathing? - N  Doing errands, shopping? N -  Preparing Food and eating ? - -  Using the Toilet? - -  In the past six months, have  you accidently leaked urine? - -  Do you have problems with loss of bowel control? - -  Managing your Medications? - -  Managing your Finances? - -  Housekeeping or managing your Housekeeping? - -    Fall/Depression Screening: PHQ 2/9 Scores 09/28/2015 08/26/2015 07/29/2015 07/20/2015 07/20/2015 07/05/2015 01/08/2015  PHQ - 2 Score 0 0 0 0 0 0 0    Assessment: Patient continues to benefit from health coach outreach for disease management and support.  Plan:  [facility] CM Care Plan Problem One        Most Recent Value   Care Plan Problem One  Diabetes Knowledge deficit   Role Documenting the Problem One  Health Coach   Care Plan for Problem One  Active   THN  Long Term Goal (31-90 days)  Patient will verbalize care for diabetes such as diet  and blood sugar monitoring within the next 90 days.   THN Long Term Goal Start Date  10/14/15   Interventions for Problem One [place] reiterated with patient foods high in carbohydrates and reasons to monitor blood sugar regularly.    THN CM Short Term Goal #1 (0-30 days)  Patient will name at least three signs of hypoglycemia within 30 days   THN CM Short Term Goal #1 Start Date  09/28/15   [facility] CM Short Term Goal #1 Met Date  10/14/15   Interventions for Short Term Goal #1  Patient able to name signs of hypoglycemia.    THN CM Short Term Goal #2 (0-30 days)  Patient will monitor and record blood sugars at least 3 times a week within the next 30 days.    THN CM Short Term Goal #2 Start Date  09/28/15 [goal continued]   Interventions for Short Term Goal #2  RN Health Coach reiterated with patient the importance of monitoring blood sugar and normal blood sugar ranges.      RN Health Coach will contact patient within one month and patient agrees to next outreach.    [name], RN, MSN [place] [phone]

## 2015-10-18 ENCOUNTER — Encounter: Payer: Medicare Other | Admitting: Internal Medicine

## 2015-10-19 ENCOUNTER — Encounter: Payer: Self-pay | Admitting: Internal Medicine

## 2015-10-21 ENCOUNTER — Other Ambulatory Visit: Payer: Self-pay | Admitting: Internal Medicine

## 2015-10-21 ENCOUNTER — Other Ambulatory Visit: Payer: Self-pay | Admitting: Nurse Practitioner

## 2015-10-22 ENCOUNTER — Telehealth: Payer: Self-pay | Admitting: Family Medicine

## 2015-10-22 NOTE — Telephone Encounter (Signed)
What medication is patient requesting ?

## 2015-10-22 NOTE — Telephone Encounter (Signed)
[facility]  Patient is calling to say that his gout is horrible and he is in a ton of pain, could the pills for his gout be called in by chance  [phone]

## 2015-10-25 ENCOUNTER — Ambulatory Visit (INDEPENDENT_AMBULATORY_CARE_PROVIDER_SITE_OTHER): Payer: Medicare Other | Admitting: "Endocrinology

## 2015-10-25 ENCOUNTER — Encounter: Payer: Self-pay | Admitting: "Endocrinology

## 2015-10-25 ENCOUNTER — Other Ambulatory Visit: Payer: Self-pay | Admitting: Family Medicine

## 2015-10-25 VITALS — BP 148/82 | HR 86 | Ht 72.0 in | Wt 303.0 lb

## 2015-10-25 DIAGNOSIS — E785 Hyperlipidemia, unspecified: Secondary | ICD-10-CM

## 2015-10-25 DIAGNOSIS — E1169 Type 2 diabetes mellitus with other specified complication: Secondary | ICD-10-CM

## 2015-10-25 DIAGNOSIS — I10 Essential (primary) hypertension: Secondary | ICD-10-CM | POA: Diagnosis not present

## 2015-10-25 DIAGNOSIS — E119 Type 2 diabetes mellitus without complications: Secondary | ICD-10-CM

## 2015-10-25 DIAGNOSIS — E032 Hypothyroidism due to medicaments and other exogenous substances: Secondary | ICD-10-CM | POA: Diagnosis not present

## 2015-10-25 DIAGNOSIS — E669 Obesity, unspecified: Secondary | ICD-10-CM | POA: Diagnosis not present

## 2015-10-25 NOTE — Patient Instructions (Signed)

## 2015-10-25 NOTE — Telephone Encounter (Signed)
Refill appropriate and filled per protocol. 

## 2015-10-25 NOTE — Progress Notes (Signed)
Subjective:    Patient ID: Peter Dunlap, male    DOB: 11-26-1955, PCP [name], MD   Past Medical History  Diagnosis Date  . Hypertension   . Arteriosclerotic cardiovascular disease (ASCVD) 2005    catheterization in 10/2010:50% mid LAD, diffuse distal disease, circumflex irregularities, large dominant RCA with a 50% ostial, 70% distal, 60% posterolateral and 70% PDA; normal EF  . Cerebrovascular disease 2010    R. carotid endarterectomy; Duplex in 10/2010-widely patent ICAs, subtotal left vertebral-not thought to be contributing to symptoms  . Hyperlipidemia   . Obesity   . Tobacco abuse     Quit 2014  . Benign prostatic hypertrophy   . Cervical spine disease     CT in 2012-advanced degeneration and spondylosis with moderate spinal stenosis at C3-C6  . H/O: substance abuse     Cocaine, marijuana, alcohol.  Quit 2013.   [name] Gastroesophageal reflux disease   . Depression   . Erectile dysfunction   . H/O hiatal hernia   . Arthritis   . Non-ST elevation myocardial infarction (NSTEMI), initial episode of care [facility]) 12/02/2013    DES LAD  . Thyroid disease   . Sleep apnea     CPAP  . Tachy-brady syndrome ([place])     a. s/p STJ dual chamber PPM    Past Surgical History  Procedure Laterality Date  . Radiofrequency ablation  2005    for PSVT  . Coronary angioplasty with stent placement  12/03/2013    LAD 90%-->0% W/ Promus Premier DES 3.0 mm x 16 mm, CFX OK, RCA 40%, EF 70-75%  . Carotid endarterectomy Right Feb. 25, 2010     CEA  . Trudee Kuster hole Right 04/13/2014    Procedure: Haskell Flirt;  Surgeon: Charlie Pitter, MD;  Location: [place] NEURO ORS;  Service: Neurosurgery;  Laterality: Right;  . Left heart catheterization with coronary angiogram Left 12/03/2013    Procedure: LEFT HEART CATHETERIZATION WITH CORONARY ANGIOGRAM;  Surgeon: Leonie Man, MD;  Location: [facility] CATH LAB;  Service: Cardiovascular;  Laterality: Left;  . Percutaneous coronary stent intervention (pci-s)   12/03/2013    Procedure: PERCUTANEOUS CORONARY STENT INTERVENTION (PCI-S);  Surgeon: Leonie Man, MD;  Location: [facility] CATH LAB;  Service: Cardiovascular;;  . Left heart catheterization with coronary angiogram N/A 01/26/2014    Procedure: LEFT HEART CATHETERIZATION WITH CORONARY ANGIOGRAM;  Surgeon: Jettie Booze, MD;  Location: [facility] CATH LAB;  Service: Cardiovascular;  Laterality: N/A;  . Left heart catheterization with coronary angiogram N/A 08/03/2014    Procedure: LEFT HEART CATHETERIZATION WITH CORONARY ANGIOGRAM;  Surgeon: Burnell Blanks, MD;  Location: [facility] CATH LAB;  Service: Cardiovascular;  Laterality: N/A;  . Permanent pacemaker insertion N/A 09/18/2014    Procedure: PERMANENT PACEMAKER INSERTION;  Surgeon: [name], MD;  Location: [facility] CATH LAB;  Service: Cardiovascular;  Laterality: N/A;  . Brain surgery  2015    hematoma evacuation  . Tee without cardioversion N/A 07/27/2015    Procedure: TRANSESOPHAGEAL ECHOCARDIOGRAM (TEE);  Surgeon: Lelon Perla, MD;  Location: [facility] ENDOSCOPY;  Service: Cardiovascular;  Laterality: N/A;  . Electrophysiologic study N/A 08/05/2015    Procedure: LEFT ATRIAL APPENDAGE OCCLUSION;  Surgeon: [name], MD;  Location: [place] CV LAB;  Service: Cardiovascular;  Laterality: N/A;  . Tee without cardioversion N/A 09/15/2015    Procedure: TRANSESOPHAGEAL ECHOCARDIOGRAM (TEE);  Surgeon: Thayer Headings, MD;  Location: [place];  Service: Cardiovascular;  Laterality: N/A;   Social History  Social History  . Marital Status: Married    Spouse Name: N/A  . Number of Children: 0  . Years of Education: N/A   Occupational History  . Retired    Social History Main Topics  . Smoking status: Former Smoker -- 1.00 packs/day for 40 years    Types: Cigarettes    Start date: 10/20/1972    Quit date: 10/10/2012  . Smokeless tobacco: Never Used     Comment: Quit in May.   . Alcohol Use: No     Comment: former drinker-- sober since 2013.    . Drug Use: No     Comment: quit cocaine 10/2011  . Sexual Activity:    Partners: Female   Other Topics Concern  . None   Social History Narrative   Lives in [place].   Outpatient Encounter Prescriptions as of 10/25/2015  Medication Sig  . allopurinol (ZYLOPRIM) 100 MG tablet Take 100 mg by mouth daily.  [name] aspirin EC 325 MG EC tablet Take 1 tablet (325 mg total) by mouth daily.  . clopidogrel (PLAVIX) 75 MG tablet Take 1 tablet (75 mg total) by mouth daily.  [name] dexlansoprazole (DEXILANT) 60 MG capsule Take 1 capsule (60 mg total) by mouth daily.  . furosemide (LASIX) 20 MG tablet TAKE 3 TABLETS BY MOUTH DAILY  . isosorbide mononitrate (IMDUR) 60 MG 24 hr tablet TAKE 1 TABLET BY MOUTH EVERY DAY  . levothyroxine (SYNTHROID, LEVOTHROID) 175 MCG tablet Take 1 tablet (175 mcg total) by mouth daily before breakfast.  . lidocaine (XYLOCAINE) 5 % ointment Apply 1 application topically as directed.  . Magnesium Oxide 400 MG CAPS Take 1 capsule (400 mg total) by mouth 2 (two) times daily.  . metFORMIN (GLUCOPHAGE) 500 MG tablet Take 1 tablet (500 mg total) by mouth 2 (two) times daily with a meal.  . metoprolol tartrate (LOPRESSOR) 25 MG tablet TAKE 1 TABLET(25 MG) BY MOUTH TWICE DAILY  . nitroGLYCERIN (NITROSTAT) 0.4 MG SL tablet Place 0.4 mg under the tongue every 5 (five) minutes as needed for chest pain (x 3 doses).  . ONE TOUCH ULTRA TEST test strip USE TO TEST ONCE DAILY  . potassium chloride SA (K-DUR,KLOR-CON) 20 MEQ tablet TAKE 1 TABLET BY MOUTH EVERY DAY  . RAPAFLO 8 MG CAPS capsule TAKE 1 CAPSULE(8 MG) BY MOUTH DAILY  . simvastatin (ZOCOR) 40 MG tablet Take 1 tablet (40 mg total) by mouth daily.  . sotalol (BETAPACE) 80 MG tablet Take 1 tablet (80 mg total) by mouth 2 (two) times daily.  . traZODone (DESYREL) 50 MG tablet TAKE 1/2 TO 1 TABLET BY MOUTH AT BEDTIME AS NEEDED FOR SLEEP  . [DISCONTINUED] clopidogrel (PLAVIX) 75 MG tablet TAKE 1 TABLET(75 MG) BY MOUTH DAILY  .  [DISCONTINUED] HYDROcodone-acetaminophen (NORCO) 7.5-325 MG tablet Take 1 tablet by mouth every 6 (six) hours as needed for moderate pain.  . [DISCONTINUED] isosorbide mononitrate (IMDUR) 60 MG 24 hr tablet TAKE 1 TABLET BY MOUTH EVERY DAY   Facility-Administered Encounter Medications as of 10/25/2015  Medication  . 0.9 %  sodium chloride infusion   ALLERGIES: Allergies  Allergen Reactions  . Lactose Intolerance (Gi) Other (See Comments)    UPSET STOMACH    VACCINATION STATUS: Immunization History  Administered Date(s) Administered  . Influenza,inj,Quad PF,36+ Mos 02/17/2014, 03/09/2015  . Pneumococcal Polysaccharide-23 01/05/2014  . Tdap 12/19/2010    HPI  Mr. Reichow is a 60 - yr-old male patient with medical history as above.  He was given  RAI therapy on March 16, 2014  . He is now more consistent  taking his levothyroxine. He is on levothyroxine 175 g by mouth every morning.  He feels better, he lost 23 pounds since last visit.   -He is being evaluated for right knee surgery from advanced arthritis. He also has controlled type 2 diabetes on metformin.    He denies cold intolerance.  Pt denies family history of thyroid dysfunction . He denies personal history of goiter. he has hx of a-fib, and CAD which required stent placement x 2.   Review of Systems  Constitutional:  Lost 23 pounds appropriately,   +fatigue, no subjective hyperthermia/hypothermia. Eyes: no blurry vision, no xerophthalmia ENT: no sore throat, no nodules palpated in throat, no dysphagia/odynophagia, no hoarseness Cardiovascular: no CP/SOB/palpitations/leg swelling Respiratory: no cough/SOB Gastrointestinal: no N/V/D/C Musculoskeletal:  complains of right knee pain and left foot pain from gout.  Skin: no rashes Neurological: no tremors/numbness/tingling/dizziness Psychiatric: no depression/anxiety  Objective:    BP 148/82 mmHg  Pulse 86  Ht 6' (1.829 m)  Wt 303 lb (137.44 kg)  BMI 41.09 kg/m2   SpO2 98%  Wt Readings from Last 3 Encounters:  10/25/15 303 lb (137.44 kg)  09/15/15 315 lb (142.883 kg)  08/16/15 319 lb 1.9 oz (144.752 kg)    Physical Exam Constitutional:  Walks with crutches due to arthritis/gout  obese, in NAD Eyes: PERRLA, EOMI, no exophthalmos ENT: moist mucous membranes, no thyromegaly, no cervical lymphadenopathy Cardiovascular: RRR, No MRG Respiratory: CTA B Gastrointestinal: abdomen soft, NT, ND, BS+ Musculoskeletal:  strength intact in all 4 Skin: moist, warm, no rashes Neurological: no tremor with outstretched hands, DTR normal in all 4   CMP     Component Value Date/Time   NA 139 08/18/2015 1100   K 4.0 08/18/2015 1100   CL 102 08/18/2015 1100   CO2 25 08/18/2015 1100   GLUCOSE 106* 08/18/2015 1100   BUN 15 08/18/2015 1100   CREATININE 1.31 08/18/2015 1100   CREATININE 1.37* 08/06/2015 0547   CALCIUM 9.7 08/18/2015 1100   PROT 7.1 12/20/2014 0730   ALBUMIN 3.3* 12/20/2014 0730   AST 21 12/20/2014 0730   ALT 15* 12/20/2014 0730   ALKPHOS 83 12/20/2014 0730   BILITOT 1.0 12/20/2014 0730   GFRNONAA 55* 08/06/2015 0547   GFRAA >60 08/06/2015 0547     Diabetic Labs (most recent): Lab Results  Component Value Date   HGBA1C 6.1 06/26/2015   HGBA1C 6.0* 12/20/2014   HGBA1C 6.1* 10/26/2014     Lipid Panel ( most recent) Lipid Panel     Component Value Date/Time   CHOL 127 12/20/2014 0430   TRIG 84 12/20/2014 0430   HDL 33* 12/20/2014 0430   CHOLHDL 3.8 12/20/2014 0430   VLDL 17 12/20/2014 0430   LDLCALC 77 12/20/2014 0430      Assessment & Plan:   1. Hypothyroidism due to medicaments and other exogenous substances His TFTs are c/w adequate replacement. I urged him to stay consistent in taking this important hormone daily, LT4  175 mcg po qam. He has a-flutter with triple CAD.  - We discussed about correct intake of levothyroxine, at fasting, with water, separated by at least 30 minutes from breakfast, and separated by more  than 4 hours from calcium, iron, multivitamins, acid reflux medications (PPIs). -Patient is made aware of the fact that thyroid hormone replacement is needed for life, dose to be adjusted by periodic monitoring of thyroid function tests.   2. Diabetes  mellitus type 2 in obese ([place]) -Controlled with A1c of 6.1%. I approached him for low-dose metformin therapy. He agrees and I would initiate metformin 500 mg by mouth twice a day.  3. Essential hypertension - Improving /Uncontrolled. He did not take his blood pressure medications this morning. I advised him to be consistent in taking his blood pressure medications. 4. Obesity: I counseled him on low-carb diet , he is losing weight currently due to appropriate intake of his thyroid hormone replacement.  - I advised patient to maintain close follow up with [name], MD for primary care needs. Follow up plan: Return in about 3 months (around 01/25/2016) for diabetes, high blood pressure, high cholesterol, underactive thyroid, follow up with pre-visit labs.  Glade Lloyd, MD Phone: [phone]  Fax: [phone]   10/25/2015, 9:09 AM

## 2015-10-26 DIAGNOSIS — G4733 Obstructive sleep apnea (adult) (pediatric): Secondary | ICD-10-CM | POA: Diagnosis not present

## 2015-10-27 ENCOUNTER — Other Ambulatory Visit: Payer: Self-pay | Admitting: *Deleted

## 2015-10-27 ENCOUNTER — Ambulatory Visit (INDEPENDENT_AMBULATORY_CARE_PROVIDER_SITE_OTHER): Payer: Medicare Other | Admitting: Family Medicine

## 2015-10-27 ENCOUNTER — Encounter: Payer: Self-pay | Admitting: Family Medicine

## 2015-10-27 ENCOUNTER — Other Ambulatory Visit: Payer: Self-pay | Admitting: Family Medicine

## 2015-10-27 VITALS — BP 130/72 | HR 78 | Temp 97.6°F | Resp 14 | Ht 72.0 in | Wt 303.0 lb

## 2015-10-27 DIAGNOSIS — M1711 Unilateral primary osteoarthritis, right knee: Secondary | ICD-10-CM | POA: Diagnosis not present

## 2015-10-27 DIAGNOSIS — R350 Frequency of micturition: Secondary | ICD-10-CM

## 2015-10-27 DIAGNOSIS — M10071 Idiopathic gout, right ankle and foot: Secondary | ICD-10-CM

## 2015-10-27 DIAGNOSIS — M109 Gout, unspecified: Secondary | ICD-10-CM

## 2015-10-27 LAB — URINALYSIS, MICROSCOPIC ONLY
Casts: NONE SEEN [LPF]
Crystals: NONE SEEN [HPF]
Yeast: NONE SEEN [HPF]

## 2015-10-27 LAB — URINALYSIS, ROUTINE W REFLEX MICROSCOPIC
Bilirubin Urine: NEGATIVE
Glucose, UA: NEGATIVE
Nitrite: NEGATIVE
Specific Gravity, Urine: 1.02 (ref 1.001–1.035)
pH: 6 (ref 5.0–8.0)

## 2015-10-27 MED ORDER — CIPROFLOXACIN HCL 500 MG PO TABS: 500.0000 mg | ORAL_TABLET | Freq: Two times a day (BID) | ORAL | Status: DC

## 2015-10-27 MED ORDER — OXYCODONE-ACETAMINOPHEN 5-325 MG PO TABS: 1.0000 | ORAL_TABLET | Freq: Four times a day (QID) | ORAL | Status: DC | PRN

## 2015-10-27 MED ORDER — COLCHICINE 0.6 MG PO TABS: 0.6000 mg | ORAL_TABLET | Freq: Every day | ORAL | Status: DC

## 2015-10-27 MED ORDER — ALLOPURINOL 100 MG PO TABS: 100.0000 mg | ORAL_TABLET | Freq: Every day | ORAL | Status: DC

## 2015-10-27 NOTE — Patient Instructions (Addendum)
Hold the simvastatin while you take the colchicine- for 1 week  Take 2 of the colchicine today, then 1 tablet daily  Restart the allopurinol

## 2015-10-27 NOTE — Assessment & Plan Note (Signed)
Acute gout attack in setting of being off his prophylactic medication which she does not know how long he's been off of this. We will plan to put him back on allopurinol. I will also check a uric acid level today. I've given him a regimen of colchicine 4 week he was hold his simvastatin while on this. Also given him prescription for Percocet as he also has underlying significant osteoarthritis which they cannot do anything surgically with as he is too high risk for surgery If the effusion in his knee does not go down with the gout treatment then we will have orthopedics try to draw off the fluid which she's had done in the past

## 2015-10-27 NOTE — Telephone Encounter (Signed)
Refill appropriate and filled per protocol. 

## 2015-10-27 NOTE — Progress Notes (Signed)
Patient ID: Peter Dunlap, male   DOB: 1956-02-24, 60 y.o.   MRN: [id number]    Subjective:    Patient ID: Peter Dunlap, male    DOB: [birth year], 60 y.o.   MRN: [id number]  Patient presents for Gout Pain Patient here with a gout flare. He has history of gout he is suppose to be on allopurinol 100 mg once a day But does not have this prescription. Note he is also on Plavix secondary to his cardiovascular history therefore cannot take any anti-inflammatories. He recently completed a course of coumadin after he had a nighwatch placed for his A fib/cardiac history  He has been treated with colchine in the past Got any difficulty.  He has been doing well the past year without any gout flares to this past weekend. He states that he had been walking and losing weight and the flare just came on him started in his left great toe then he felt in his right ankle and now his left knee is swollen. He also has underlying osteoarthritis of the knees. He is walking with crutches today because of the pain. He does not have anything at home that he can take with the exception of Tylenol.  He was recently seen by endocrinology for his diabetes as well as his hypothyroidism. He does admit to some urinary frequency but denies any dysuria this has been the past few weeks. He is on Lasix by his cardiologist as well.  Note he does have a small blister on his left leg he had used a heating pad and then the blister came up he does not have any pain has not had any drainage   Review Of Systems:  GEN- denies fatigue, fever, weight loss,weakness, recent illness HEENT- denies eye drainage, change in vision, nasal discharge, CVS- denies chest pain, palpitations RESP- denies SOB, cough, wheeze ABD- denies N/V, change in stools, abd pain GU- denies dysuria, hematuria, dribbling, incontinence MSK- + joint pain, muscle aches, injury Neuro- denies headache, dizziness, syncope, seizure activity       Objective:     BP 130/72 mmHg  Pulse 78  Temp(Src) 97.6 F (36.4 C) (Oral)  Resp 14  Ht 6' (1.829 m)  Wt 303 lb (137.44 kg)  BMI 41.09 kg/m2 GEN- NAD, alert and oriented x3 HEENT- PERRL, EOMI, non injected sclera, pink conjunctiva, MMM, oropharynx clear Neck- Supple, no thyromegaly CVS- irregular rythem, systolic murmur RESP-CTAB MSK- Effusion Left knee, decreased ROM, TTP inferior knee, - Clear fluid blister on left leg below knee, NT, no erythema, mild swelling right ankle, good ROM, Great Toe NT, no swelling or erythema Pulses- Radial, DP- 2+        Assessment & Plan:      Problem List Items Addressed This Visit    OA (osteoarthritis) of knee   Relevant Medications   allopurinol (ZYLOPRIM) 100 MG tablet   colchicine 0.6 MG tablet   oxyCODONE-acetaminophen (ROXICET) 5-325 MG tablet   Gout - Primary    Acute gout attack in setting of being off his prophylactic medication which she does not know how long he's been off of this. We will plan to put him back on allopurinol. I will also check a uric acid level today. I've given him a regimen of colchicine 4 week he was hold his simvastatin while on this. Also given him prescription for Percocet as he also has underlying significant osteoarthritis which they cannot do anything surgically with as he is too high risk for  surgery If the effusion in his knee does not go down with the gout treatment then we will have orthopedics try to draw off the fluid which she's had done in the past      Relevant Orders   Uric Acid   Basic metabolic panel    Other Visit Diagnoses    Urinary frequency        Check UA and BMET, DM has been well controlled, could be SE of all of his medications    Relevant Orders    Urinalysis, Routine w reflex microscopic (not at [facility])    Basic metabolic panel       Note: This dictation was prepared with Dragon dictation along with smaller phrase technology. Any transcriptional errors that result from this process are  unintentional.

## 2015-10-27 NOTE — Addendum Note (Signed)
Addended by: [name] F on: 10/27/2015 11:14 AM   Modules accepted: Orders

## 2015-10-28 LAB — BASIC METABOLIC PANEL
BUN: 13 mg/dL (ref 7–25)
CO2: 25 mmol/L (ref 20–31)
Calcium: 9.2 mg/dL (ref 8.6–10.3)
Chloride: 98 mmol/L (ref 98–110)
Creat: 1.18 mg/dL (ref 0.70–1.25)
Glucose, Bld: 82 mg/dL (ref 70–99)
Potassium: 4.7 mmol/L (ref 3.5–5.3)
Sodium: 139 mmol/L (ref 135–146)

## 2015-10-28 LAB — URIC ACID: Uric Acid, Serum: 8.5 mg/dL — ABNORMAL HIGH (ref 4.0–7.8)

## 2015-11-02 ENCOUNTER — Other Ambulatory Visit: Payer: Self-pay | Admitting: "Endocrinology

## 2015-11-05 ENCOUNTER — Encounter: Payer: Self-pay | Admitting: Family Medicine

## 2015-11-05 ENCOUNTER — Ambulatory Visit (INDEPENDENT_AMBULATORY_CARE_PROVIDER_SITE_OTHER): Payer: Medicare Other | Admitting: Family Medicine

## 2015-11-05 VITALS — BP 132/76 | HR 68 | Temp 97.8°F | Resp 14 | Ht 72.0 in | Wt 307.0 lb

## 2015-11-05 DIAGNOSIS — E119 Type 2 diabetes mellitus without complications: Secondary | ICD-10-CM | POA: Diagnosis not present

## 2015-11-05 DIAGNOSIS — I10 Essential (primary) hypertension: Secondary | ICD-10-CM

## 2015-11-05 DIAGNOSIS — N400 Enlarged prostate without lower urinary tract symptoms: Secondary | ICD-10-CM

## 2015-11-05 DIAGNOSIS — E785 Hyperlipidemia, unspecified: Secondary | ICD-10-CM | POA: Diagnosis not present

## 2015-11-05 DIAGNOSIS — N390 Urinary tract infection, site not specified: Secondary | ICD-10-CM | POA: Diagnosis not present

## 2015-11-05 DIAGNOSIS — E669 Obesity, unspecified: Secondary | ICD-10-CM | POA: Diagnosis not present

## 2015-11-05 DIAGNOSIS — E1169 Type 2 diabetes mellitus with other specified complication: Secondary | ICD-10-CM

## 2015-11-05 DIAGNOSIS — K219 Gastro-esophageal reflux disease without esophagitis: Secondary | ICD-10-CM

## 2015-11-05 DIAGNOSIS — Z0000 Encounter for general adult medical examination without abnormal findings: Secondary | ICD-10-CM

## 2015-11-05 LAB — URINALYSIS, MICROSCOPIC ONLY
Casts: NONE SEEN [LPF]
Crystals: NONE SEEN [HPF]
Yeast: NONE SEEN [HPF]

## 2015-11-05 LAB — COMPREHENSIVE METABOLIC PANEL
ALT: 15 U/L (ref 9–46)
AST: 23 U/L (ref 10–35)
Albumin: 4 g/dL (ref 3.6–5.1)
Alkaline Phosphatase: 76 U/L (ref 40–115)
BUN: 13 mg/dL (ref 7–25)
CO2: 23 mmol/L (ref 20–31)
Calcium: 9.1 mg/dL (ref 8.6–10.3)
Chloride: 102 mmol/L (ref 98–110)
Creat: 1.34 mg/dL — ABNORMAL HIGH (ref 0.70–1.25)
Glucose, Bld: 144 mg/dL — ABNORMAL HIGH (ref 70–99)
Potassium: 3.9 mmol/L (ref 3.5–5.3)
Sodium: 137 mmol/L (ref 135–146)
Total Bilirubin: 0.8 mg/dL (ref 0.2–1.2)
Total Protein: 7.5 g/dL (ref 6.1–8.1)

## 2015-11-05 LAB — CBC WITH DIFFERENTIAL/PLATELET
Basophils Absolute: 81 cells/uL (ref 0–200)
Basophils Relative: 1 %
Eosinophils Absolute: 162 cells/uL (ref 15–500)
Eosinophils Relative: 2 %
HCT: 49.6 % (ref 38.5–50.0)
Hemoglobin: 16.9 g/dL (ref 13.0–17.0)
Lymphocytes Relative: 37 %
Lymphs Abs: 2997 cells/uL (ref 850–3900)
MCH: 32.1 pg (ref 27.0–33.0)
MCHC: 34.1 g/dL (ref 32.0–36.0)
MCV: 94.3 fL (ref 80.0–100.0)
MPV: 10.2 fL (ref 7.5–12.5)
Monocytes Absolute: 567 cells/uL (ref 200–950)
Monocytes Relative: 7 %
Neutro Abs: 4293 cells/uL (ref 1500–7800)
Neutrophils Relative %: 53 %
Platelets: 245 10*3/uL (ref 140–400)
RBC: 5.26 MIL/uL (ref 4.20–5.80)
RDW: 14.5 % (ref 11.0–15.0)
WBC: 8.1 10*3/uL (ref 3.8–10.8)

## 2015-11-05 LAB — HEMOGLOBIN A1C
Hgb A1c MFr Bld: 5.4 % (ref <5.7)
Mean Plasma Glucose: 108 mg/dL

## 2015-11-05 LAB — URINALYSIS, ROUTINE W REFLEX MICROSCOPIC
Bilirubin Urine: NEGATIVE
Glucose, UA: NEGATIVE
Ketones, ur: NEGATIVE
Nitrite: NEGATIVE
Specific Gravity, Urine: 1.015 (ref 1.001–1.035)
pH: 6 (ref 5.0–8.0)

## 2015-11-05 LAB — LIPID PANEL
Cholesterol: 151 mg/dL (ref 125–200)
HDL: 35 mg/dL — ABNORMAL LOW (ref >=40)
LDL Cholesterol: 76 mg/dL (ref <130)
Total CHOL/HDL Ratio: 4.3 Ratio (ref <=5.0)
Triglycerides: 199 mg/dL — ABNORMAL HIGH (ref <150)
VLDL: 40 mg/dL — ABNORMAL HIGH (ref <30)

## 2015-11-05 NOTE — Assessment & Plan Note (Signed)
Given dietary handouts, discussed avoiding red meats, fried foods, he has gout and gERD

## 2015-11-05 NOTE — Assessment & Plan Note (Signed)
Check PSA, symptoms urinary frequency resolved after treatment

## 2015-11-05 NOTE — Assessment & Plan Note (Signed)
Recommend zantac in evening, continue Dexilant in AM

## 2015-11-05 NOTE — Assessment & Plan Note (Signed)
Well controlled, no change to meds 

## 2015-11-05 NOTE — Assessment & Plan Note (Signed)
Recheck A1C, no change to metformin for now

## 2015-11-05 NOTE — Patient Instructions (Addendum)
We will call with lab results Shingles shot sent to pharmacy  Referral to eye doctor  Add zantac in the evening   Work on diet  F/U 4 months

## 2015-11-05 NOTE — Progress Notes (Signed)
Patient ID: Peter Dunlap, male   DOB: 1956-03-11, 60 y.o.   MRN: [id number]  Subjective:   Patient presents for Medicare Annual/Subsequent preventive examination.   Pt here for annual wellness  Followed by multiple specialist, including Cardiology- has watchman device and pacer    Endocrine- Hyperthyroidism>  Hypothyroidism s/p ablation and DM - currently on metformin CBG less than 120 ,occasional dizziness in the morning    Orthopedics- chronic knee/foot pain   Vascular surgery   Last visit treated for gout and UTI, needs repeat UA/Culture today  . He did have diarrhea with the cipro that is now improved. Also noticed more reflux this past week, but had a lot of fried foods  And other foods at a cookout, prior too symptoms controlled with dexilant  Review Past Medical/Family/Social: per EMR    Risk Factors  Current exercise habits: walks some  Dietary issues discussed: Yes   Cardiac risk factors: Obesity (BMI >= 30 kg/m2).   Depression Screen  (Note: if answer to either of the following is "Yes", a more complete depression screening is indicated)  Over the past two weeks, have you felt down, depressed or hopeless? No Over the past two weeks, have you felt little interest or pleasure in doing things? No Have you lost interest or pleasure in daily life? No Do you often feel hopeless? No Do you cry easily over simple problems? No   Activities of Daily Living  In your present state of health, do you have any difficulty performing the following activities?:  Driving? No  Managing money? No  Feeding yourself? No  Getting from bed to chair? No  Climbing a flight of stairs? Yes   Preparing food and eating?: No  Bathing or showering? No  Getting dressed: No  Getting to the toilet? No  Using the toilet:No  Moving around from place to place: Yes In the past year have you fallen or had a near fall?:No  Are you sexually active? No Do you have more than one partner? No    Hearing Difficulties: No  Do you often ask people to speak up or repeat themselves? No  Do you experience ringing or noises in your ears? No Do you have difficulty understanding soft or whispered voices? No  Do you feel that you have a problem with memory? No Do you often misplace items? No  Do you feel safe at home? Yes  Cognitive Testing  Alert? Yes Normal Appearance?Yes  Oriented to person? Yes Place? Yes  Time? Yes  Recall of three objects? Yes  Can perform simple calculations? Yes  Displays appropriate judgment?Yes  Can read the correct time from a watch face?Yes   List the Names of Other Physician/Practitioners you currently use:  Per chart/above   ROS: GEN- denies fatigue, fever, weight loss,weakness, recent illness HEENT- denies eye drainage, change in vision, nasal discharge, CVS- denies chest pain, palpitations RESP- denies SOB, cough, wheeze ABD- denies N/V, change in stools, abd pain GU- denies dysuria, hematuria, dribbling, incontinence MSK- denies joint pain, muscle aches, injury Neuro- denies headache, dizziness, syncope, seizure activity  PHYSICAL: GEN- NAD, alert and oriented x3 HEENT- PERRL, EOMI, non injected sclera, pink conjunctiva, MMM, oropharynx clear Neck- Supple, no thryomegaly CVS- irregular rhythem, 2/6  murmur RESP-CTAB ABD-nabs, soft NT,ND  GU- Rectum normal tone- internal hemorroids, FOBT neg, prosate smooth but nelarged  EXT- No edema Pulses- Radial, DP- 2+  Screening Tests / Date Colonoscopy   UTD  Zostavax - Due  Influenza Vaccine  UTD Tetanus/tdap UTD  Pneumonia- UTD    Assessment:    Annual wellness medicare exam   Plan:    During the course of the visit the patient was educated and counseled about appropriate screening and preventive services including:   Shingles vaccine. Prescription given to that she can get the vaccine at the pharmacy or Medicare part D.  Screen Neg  for depression.    Patient  Instructions (the written plan) was given to the patient.  Medicare Attestation  I have personally reviewed:  The patient's medical and social history  Their use of alcohol, tobacco or illicit drugs  Their current medications and supplements  The patient's functional ability including ADLs,fall risks, home safety risks, cognitive, and hearing and visual impairment  Diet and physical activities  Evidence for depression or mood disorders  The patient's weight, height, BMI, and visual acuity have been recorded in the chart. I have made referrals, counseling, and provided education to the patient based on review of the above and I have provided the patient with a written personalized care plan for preventive services.

## 2015-11-06 LAB — PSA, MEDICARE: PSA: 10.05 ng/mL — ABNORMAL HIGH (ref <=4.00)

## 2015-11-06 LAB — MICROALBUMIN / CREATININE URINE RATIO
Creatinine, Urine: 257 mg/dL (ref 20–370)
Microalb Creat Ratio: 16 mcg/mg creat (ref <30)
Microalb, Ur: 4.1 mg/dL

## 2015-11-07 LAB — URINE CULTURE: Colony Count: 15000

## 2015-11-09 ENCOUNTER — Telehealth: Payer: Self-pay

## 2015-11-09 NOTE — Telephone Encounter (Signed)
Patient aware of results and recommendations. °

## 2015-11-11 ENCOUNTER — Other Ambulatory Visit: Payer: Self-pay

## 2015-11-11 NOTE — Patient Outreach (Signed)
[facility]) Care Management  [place]  11/11/2015   Peter Dunlap [birth year] [id number]  Subjective: Telephone call to patient for monthly call.  Patient reports he is doing good.  Recent primary care appointment.  He reports that his metformin was decreased to 1 tablet once a day.  Patient reports sugars running in the 90's.  Patient exercising regularly. Discussed diet and continuing to monitor his sugars. He verbalized understanding.  Patient reports his PSA was abnormal and he will be seeing a urologist the end of this month. No concerns.    Objective:   Encounter Medications:  Outpatient Encounter Prescriptions as of 11/11/2015  Medication Sig Note  . allopurinol (ZYLOPRIM) 100 MG tablet TAKE 1 TABLET BY MOUTH EVERY DAY   . aspirin EC 325 MG EC tablet Take 1 tablet (325 mg total) by mouth daily.   . clopidogrel (PLAVIX) 75 MG tablet Take 1 tablet (75 mg total) by mouth daily.   . colchicine 0.6 MG tablet Take 1 tablet (0.6 mg total) by mouth daily.   [name] DEXILANT 60 MG capsule TAKE 1 CAPSULE BY MOUTH EVERY DAY   . furosemide (LASIX) 20 MG tablet TAKE 3 TABLETS BY MOUTH DAILY   . isosorbide mononitrate (IMDUR) 60 MG 24 hr tablet TAKE 1 TABLET BY MOUTH EVERY DAY   . levothyroxine (SYNTHROID, LEVOTHROID) 175 MCG tablet TAKE 1 TABLET(175 MCG) BY MOUTH DAILY BEFORE BREAKFAST   . lidocaine (XYLOCAINE) 5 % ointment Apply 1 application topically as directed. 08/16/2015: Received from: External Pharmacy Received Sig:   . Magnesium Oxide 400 MG CAPS Take 1 capsule (400 mg total) by mouth 2 (two) times daily.   . metFORMIN (GLUCOPHAGE) 500 MG tablet TAKE 1 TABLET(500 MG) BY MOUTH TWICE DAILY WITH A MEAL 11/11/2015: Patient reports taking one tablet daily.    . metoprolol tartrate (LOPRESSOR) 25 MG tablet TAKE 1 TABLET(25 MG) BY MOUTH TWICE DAILY   . nitroGLYCERIN (NITROSTAT) 0.4 MG SL tablet Place 0.4 mg under the tongue every 5 (five) minutes as needed for chest pain (x 3  doses).   . ONE TOUCH ULTRA TEST test strip USE TO TEST ONCE DAILY   . oxyCODONE-acetaminophen (ROXICET) 5-325 MG tablet Take 1 tablet by mouth every 6 (six) hours as needed for severe pain.   . potassium chloride SA (K-DUR,KLOR-CON) 20 MEQ tablet TAKE 1 TABLET BY MOUTH EVERY DAY   . RAPAFLO 8 MG CAPS capsule TAKE 1 CAPSULE(8 MG) BY MOUTH DAILY   . simvastatin (ZOCOR) 40 MG tablet Take 1 tablet (40 mg total) by mouth daily.   . sotalol (BETAPACE) 80 MG tablet Take 1 tablet (80 mg total) by mouth 2 (two) times daily.   . traZODone (DESYREL) 50 MG tablet TAKE 1/2 TO 1 TABLET BY MOUTH AT BEDTIME AS NEEDED FOR SLEEP    Facility-Administered Encounter Medications as of 11/11/2015  Medication  . 0.9 %  sodium chloride infusion    Functional Status:  In your present state of health, do you have any difficulty performing the following activities: 08/05/2015 08/05/2015  Hearing? - N  Vision? - N  Difficulty concentrating or making decisions? - N  Walking or climbing stairs? - N  Dressing or bathing? - N  Doing errands, shopping? N -  Preparing Food and eating ? - -  Using the Toilet? - -  In the past six months, have you accidently leaked urine? - -  Do you have problems with loss of bowel control? - -  Managing your Medications? - -  Managing your Finances? - -  Housekeeping or managing your Housekeeping? - -    Fall/Depression Screening: PHQ 2/9 Scores 11/11/2015 11/05/2015 11/05/2015 10/25/2015 10/25/2015 09/28/2015 08/26/2015  PHQ - 2 Score 0 0 0 0 0 0 0  PHQ- 9 Score - - 0 - - - -    Assessment: Patient continues to benefit from health coach outreach for disease management and support.  Plan:  [facility] CM Care Plan Problem One        Most Recent Value   Care Plan Problem One  Diabetes Knowledge deficit   Role Documenting the Problem One  Health Coach   Care Plan for Problem One  Active   THN Long Term Goal (31-90 days)  Patient will verbalize care for diabetes such as diet  and blood sugar  monitoring within the next 90 days.   THN Long Term Goal Start Date  10/14/15   Interventions for Problem One [place] reviewed with patient foods high in carbohydrates and reasons to monitor blood sugar regularly.    THN CM Short Term Goal #2 (0-30 days)  Patient will monitor and record blood sugars at least 3 times a week within the next 30 days.    THN CM Short Term Goal #2 Start Date  09/28/15 [goal continued]   [facility] CM Short Term Goal #2 Met Date  11/11/15   Interventions for Short Term Goal #2  Patient checking blood sugar at least 3 times a week.      RN Health Coach will contact patient within one month and patient agrees to next outreach.  [name], RN, MSN [place] [phone]

## 2015-11-18 ENCOUNTER — Encounter: Payer: Self-pay | Admitting: Internal Medicine

## 2015-11-18 ENCOUNTER — Encounter: Payer: Self-pay | Admitting: Family

## 2015-11-18 ENCOUNTER — Other Ambulatory Visit: Payer: Self-pay | Admitting: *Deleted

## 2015-11-18 DIAGNOSIS — Z48812 Encounter for surgical aftercare following surgery on the circulatory system: Secondary | ICD-10-CM

## 2015-11-18 DIAGNOSIS — I6523 Occlusion and stenosis of bilateral carotid arteries: Secondary | ICD-10-CM

## 2015-11-19 ENCOUNTER — Other Ambulatory Visit: Payer: Self-pay | Admitting: Internal Medicine

## 2015-11-19 ENCOUNTER — Other Ambulatory Visit: Payer: Self-pay | Admitting: Family Medicine

## 2015-11-23 NOTE — Telephone Encounter (Signed)
Refill appropriate and filled per protocol. 

## 2015-11-24 ENCOUNTER — Other Ambulatory Visit: Payer: Self-pay | Admitting: Family Medicine

## 2015-11-24 NOTE — Telephone Encounter (Signed)
Refill appropriate and filled per protocol. 

## 2015-11-25 ENCOUNTER — Ambulatory Visit (HOSPITAL_COMMUNITY)
Admission: RE | Admit: 2015-11-25 | Discharge: 2015-11-25 | Disposition: A | Discharge status: Home / Self Care | Payer: Medicare Other | Source: Ambulatory Visit | Attending: Family | Admitting: Family

## 2015-11-25 ENCOUNTER — Ambulatory Visit (INDEPENDENT_AMBULATORY_CARE_PROVIDER_SITE_OTHER): Payer: Medicare Other | Admitting: Family

## 2015-11-25 ENCOUNTER — Encounter: Payer: Self-pay | Admitting: Family

## 2015-11-25 VITALS — BP 85/51 | HR 66 | Temp 97.7°F | Resp 20 | Ht 71.5 in | Wt 316.2 lb

## 2015-11-25 DIAGNOSIS — K219 Gastro-esophageal reflux disease without esophagitis: Secondary | ICD-10-CM | POA: Diagnosis not present

## 2015-11-25 DIAGNOSIS — I6523 Occlusion and stenosis of bilateral carotid arteries: Secondary | ICD-10-CM | POA: Insufficient documentation

## 2015-11-25 DIAGNOSIS — Z9889 Other specified postprocedural states: Secondary | ICD-10-CM | POA: Diagnosis not present

## 2015-11-25 DIAGNOSIS — I10 Essential (primary) hypertension: Secondary | ICD-10-CM | POA: Diagnosis not present

## 2015-11-25 DIAGNOSIS — I6521 Occlusion and stenosis of right carotid artery: Secondary | ICD-10-CM | POA: Insufficient documentation

## 2015-11-25 DIAGNOSIS — Z6841 Body Mass Index (BMI) 40.0 and over, adult: Secondary | ICD-10-CM | POA: Diagnosis not present

## 2015-11-25 DIAGNOSIS — F329 Major depressive disorder, single episode, unspecified: Secondary | ICD-10-CM | POA: Insufficient documentation

## 2015-11-25 DIAGNOSIS — G4730 Sleep apnea, unspecified: Secondary | ICD-10-CM | POA: Insufficient documentation

## 2015-11-25 DIAGNOSIS — Z48812 Encounter for surgical aftercare following surgery on the circulatory system: Secondary | ICD-10-CM | POA: Insufficient documentation

## 2015-11-25 DIAGNOSIS — Z87891 Personal history of nicotine dependence: Secondary | ICD-10-CM

## 2015-11-25 DIAGNOSIS — E785 Hyperlipidemia, unspecified: Secondary | ICD-10-CM | POA: Diagnosis not present

## 2015-11-25 DIAGNOSIS — E669 Obesity, unspecified: Secondary | ICD-10-CM | POA: Insufficient documentation

## 2015-11-25 NOTE — Patient Instructions (Signed)
Stroke Prevention Some medical conditions and behaviors are associated with an increased chance of having a stroke. You may prevent a stroke by making healthy choices and managing medical conditions. HOW CAN I REDUCE MY RISK OF HAVING A STROKE?   Stay physically active. Get at least 30 minutes of activity on most or all days.  Do not smoke. It may also be helpful to avoid exposure to secondhand smoke.  Limit alcohol use. Moderate alcohol use is considered to be:  No more than 2 drinks per day for men.  No more than 1 drink per day for nonpregnant women.  Eat healthy foods. This involves:  Eating 5 or more servings of fruits and vegetables a day.  Making dietary changes that address high blood pressure (hypertension), high cholesterol, diabetes, or obesity.  Manage your cholesterol levels.  Making food choices that are high in fiber and low in saturated fat, trans fat, and cholesterol may control cholesterol levels.  Take any prescribed medicines to control cholesterol as directed by your health care provider.  Manage your diabetes.  Controlling your carbohydrate and sugar intake is recommended to manage diabetes.  Take any prescribed medicines to control diabetes as directed by your health care provider.  Control your hypertension.  Making food choices that are low in salt (sodium), saturated fat, trans fat, and cholesterol is recommended to manage hypertension.  Ask your health care provider if you need treatment to lower your blood pressure. Take any prescribed medicines to control hypertension as directed by your health care provider.  If you are 18-39 years of age, have your blood pressure checked every 3-5 years. If you are 40 years of age or older, have your blood pressure checked every year.  Maintain a healthy weight.  Reducing calorie intake and making food choices that are low in sodium, saturated fat, trans fat, and cholesterol are recommended to manage  weight.  Stop drug abuse.  Avoid taking birth control pills.  Talk to your health care provider about the risks of taking birth control pills if you are over 35 years old, smoke, get migraines, or have ever had a blood clot.  Get evaluated for sleep disorders (sleep apnea).  Talk to your health care provider about getting a sleep evaluation if you snore a lot or have excessive sleepiness.  Take medicines only as directed by your health care provider.  For some people, aspirin or blood thinners (anticoagulants) are helpful in reducing the risk of forming abnormal blood clots that can lead to stroke. If you have the irregular heart rhythm of atrial fibrillation, you should be on a blood thinner unless there is a good reason you cannot take them.  Understand all your medicine instructions.  Make sure that other conditions (such as anemia or atherosclerosis) are addressed. SEEK IMMEDIATE MEDICAL CARE IF:   You have sudden weakness or numbness of the face, arm, or leg, especially on one side of the body.  Your face or eyelid droops to one side.  You have sudden confusion.  You have trouble speaking (aphasia) or understanding.  You have sudden trouble seeing in one or both eyes.  You have sudden trouble walking.  You have dizziness.  You have a loss of balance or coordination.  You have a sudden, severe headache with no known cause.  You have new chest pain or an irregular heartbeat. Any of these symptoms may represent a serious problem that is an emergency. Do not wait to see if the symptoms will   go away. Get medical help at once. Call your local emergency services (911 in [place] Do not drive yourself to the hospital.   This information is not intended to replace advice given to you by your health care provider. Make sure you discuss any questions you have with your health care provider.   Document Released: 06/29/2004 Document Revised: 06/12/2014 Document Reviewed:  11/22/2012 Elsevier Interactive Patient Education 2016 [employer]  

## 2015-11-25 NOTE — Progress Notes (Signed)
Chief Complaint: Follow up Extracranial Carotid Artery Stenosis   History of Present Illness  Peter Dunlap is a 60 y.o. male patient of Dr. Donnetta Hutching seen for annual carotid duplex status post right CEA in February 2010.   Patient has Negative history of TIA or stroke symptom. The patient denies amaurosis fugax or monocular blindness. The patient denies facial drooping. Pt. denies hemiplegia. The patient denies receptive or expressive aphasia. Pt. denies extremity weakness. He reports occasional left arm numbness, denies tingling, cold sensation, or aching in the left arm.  Pt denies claudication symptoms with walking, denies non healing wounds. He reports "bulging discs" in his low back, has occasional numbness at anterior thigh. States he has severe OA and needs a right knee replacement, states gout issues in left foot intermittently.  Has rotator cuff issues in both shoulders.   He states that his heart rhythm remains irregular and that his cardiologist, Dr. Crissie Sickles, is aware. He had something similar to an IVC filter placed early in 2017 or late 2016, placed by Dr. Rayann Heman, per pt. Pt states he has had 2-3 MI's.  He has had 2 MI's, last one approximately 2009.  Pt reports New Medical or Surgical History: had thyroid ablation March 2016 for hyperthyroidism, is being titrated on levothyroxine now.  He was hospitalized at [facility] end of 2014 for CHF, states he has been stabilized lately.  He walking more and intentionally losing weight.   Pt Diabetic: No, A1C was 5.4 in early June 2017 (review of records), has 2 sisters with DM, he is taking metformin Pt smoker: former smoker, quit in 2014  Pt meds include: Statin : Yes ASA: Yes Other anticoagulants/antiplatelets: Plavix. He was on coumadin for atrial fib and this was stopped after he had an intracranial bleed in 2015.    Past Medical History  Diagnosis Date  . Hypertension   . Arteriosclerotic cardiovascular  disease (ASCVD) 2005    catheterization in 10/2010:50% mid LAD, diffuse distal disease, circumflex irregularities, large dominant RCA with a 50% ostial, 70% distal, 60% posterolateral and 70% PDA; normal EF  . Cerebrovascular disease 2010    R. carotid endarterectomy; Duplex in 10/2010-widely patent ICAs, subtotal left vertebral-not thought to be contributing to symptoms  . Hyperlipidemia   . Obesity   . Tobacco abuse     Quit 2014  . Benign prostatic hypertrophy   . Cervical spine disease     CT in 2012-advanced degeneration and spondylosis with moderate spinal stenosis at C3-C6  . H/O: substance abuse     Cocaine, marijuana, alcohol.  Quit 2013.   [name] Gastroesophageal reflux disease   . Depression   . Erectile dysfunction   . H/O hiatal hernia   . Arthritis   . Non-ST elevation myocardial infarction (NSTEMI), initial episode of care [facility]) 12/02/2013    DES LAD  . Thyroid disease   . Sleep apnea     CPAP  . Tachy-brady syndrome ([place])     a. s/p STJ dual chamber PPM     Social History Social History  Substance Use Topics  . Smoking status: Former Smoker -- 1.00 packs/day for 40 years    Types: Cigarettes    Start date: 10/20/1972    Quit date: 10/10/2012  . Smokeless tobacco: Never Used     Comment: Quit in May.   . Alcohol Use: No     Comment: former drinker-- sober since 2013.     Family History Family History  Problem Relation  Age of Onset  . Hypertension Mother     Cerebrovascular disease  . Diabetes Mother   . Coronary artery disease Father 57  . Diabetes type II Father   . Hypertension Father   . Heart attack Father   . Lung cancer Paternal Uncle   . Diabetes Sister   . Hypertension Sister   . Heart attack Sister 59  . Diabetes Brother   . Hypertension Brother     Surgical History Past Surgical History  Procedure Laterality Date  . Radiofrequency ablation  2005    for PSVT  . Coronary angioplasty with stent placement  12/03/2013    LAD 90%-->0% W/  Promus Premier DES 3.0 mm x 16 mm, CFX OK, RCA 40%, EF 70-75%  . Carotid endarterectomy Right Feb. 25, 2010     CEA  . Trudee Kuster hole Right 04/13/2014    Procedure: Haskell Flirt;  Surgeon: Charlie Pitter, MD;  Location: [place] NEURO ORS;  Service: Neurosurgery;  Laterality: Right;  . Left heart catheterization with coronary angiogram Left 12/03/2013    Procedure: LEFT HEART CATHETERIZATION WITH CORONARY ANGIOGRAM;  Surgeon: Leonie Man, MD;  Location: [facility] CATH LAB;  Service: Cardiovascular;  Laterality: Left;  . Percutaneous coronary stent intervention (pci-s)  12/03/2013    Procedure: PERCUTANEOUS CORONARY STENT INTERVENTION (PCI-S);  Surgeon: Leonie Man, MD;  Location: [facility] CATH LAB;  Service: Cardiovascular;;  . Left heart catheterization with coronary angiogram N/A 01/26/2014    Procedure: LEFT HEART CATHETERIZATION WITH CORONARY ANGIOGRAM;  Surgeon: Jettie Booze, MD;  Location: [facility] CATH LAB;  Service: Cardiovascular;  Laterality: N/A;  . Left heart catheterization with coronary angiogram N/A 08/03/2014    Procedure: LEFT HEART CATHETERIZATION WITH CORONARY ANGIOGRAM;  Surgeon: Burnell Blanks, MD;  Location[facility] CATH LAB;  Service: Cardiovascular;  Laterality: N/A;  . Permanent pacemaker insertion N/A 09/18/2014    Procedure: PERMANENT PACEMAKER INSERTION;  Surgeon: [name], MD;  Location: [facility] CATH LAB;  Service: Cardiovascular;  Laterality: N/A;  . Brain surgery  2015    hematoma evacuation  . Tee without cardioversion N/A 07/27/2015    Procedure: TRANSESOPHAGEAL ECHOCARDIOGRAM (TEE);  Surgeon: Lelon Perla, MD;  Location: [facility] ENDOSCOPY;  Service: Cardiovascular;  Laterality: N/A;  . Electrophysiologic study N/A 08/05/2015    Procedure: LEFT ATRIAL APPENDAGE OCCLUSION;  Surgeon: [name], MD;  Location: [place] CV LAB;  Service: Cardiovascular;  Laterality: N/A;  . Tee without cardioversion N/A 09/15/2015    Procedure: TRANSESOPHAGEAL ECHOCARDIOGRAM (TEE);  Surgeon: Thayer Headings, MD;  Location: [place];  Service: Cardiovascular;  Laterality: N/A;    Allergies  Allergen Reactions  . Lactose Intolerance (Gi) Other (See Comments)    UPSET STOMACH     Current Outpatient Prescriptions  Medication Sig Dispense Refill  . allopurinol (ZYLOPRIM) 100 MG tablet TAKE 1 TABLET BY MOUTH EVERY DAY 90 tablet 6  . aspirin EC 325 MG EC tablet Take 1 tablet (325 mg total) by mouth daily.    . clopidogrel (PLAVIX) 75 MG tablet Take 1 tablet (75 mg total) by mouth daily. 90 tablet 3  . colchicine 0.6 MG tablet Take 1 tablet (0.6 mg total) by mouth daily. 8 tablet 1  . DEXILANT 60 MG capsule TAKE 1 CAPSULE BY MOUTH EVERY DAY 30 capsule 0  . furosemide (LASIX) 20 MG tablet TAKE 3 TABLETS BY MOUTH DAILY 90 tablet 6  . isosorbide mononitrate (IMDUR) 60 MG 24 hr tablet TAKE 1 TABLET BY MOUTH  EVERY DAY 90 tablet 2  . levothyroxine (SYNTHROID, LEVOTHROID) 175 MCG tablet TAKE 1 TABLET(175 MCG) BY MOUTH DAILY BEFORE BREAKFAST 30 tablet 2  . lidocaine (XYLOCAINE) 5 % ointment Apply 1 application topically as directed.    . Magnesium Oxide 400 MG CAPS Take 1 capsule (400 mg total) by mouth 2 (two) times daily. 60 capsule 11  . metFORMIN (GLUCOPHAGE) 500 MG tablet TAKE 1 TABLET(500 MG) BY MOUTH TWICE DAILY WITH A MEAL 60 tablet 2  . metoprolol tartrate (LOPRESSOR) 25 MG tablet TAKE 1 TABLET(25 MG) BY MOUTH TWICE DAILY 180 tablet 3  . nitroGLYCERIN (NITROSTAT) 0.4 MG SL tablet Place 0.4 mg under the tongue every 5 (five) minutes as needed for chest pain (x 3 doses).    . ONE TOUCH ULTRA TEST test strip USE TO TEST ONCE DAILY 50 each 11  . oxyCODONE-acetaminophen (ROXICET) 5-325 MG tablet Take 1 tablet by mouth every 6 (six) hours as needed for severe pain. 30 tablet 0  . potassium chloride SA (K-DUR,KLOR-CON) 20 MEQ tablet TAKE 1 TABLET BY MOUTH EVERY DAY 30 tablet 6  . RAPAFLO 8 MG CAPS capsule TAKE 1 CAPSULE(8 MG) BY MOUTH DAILY 30 capsule 6  . simvastatin (ZOCOR) 40 MG tablet Take  1 tablet (40 mg total) by mouth daily. 30 tablet 3  . sotalol (BETAPACE) 80 MG tablet Take 1 tablet (80 mg total) by mouth 2 (two) times daily. 60 tablet 6  . traZODone (DESYREL) 50 MG tablet TAKE 1/2 TO 1 TABLET BY MOUTH AT BEDTIME AS NEEDED FOR SLEEP 30 tablet 3   No current facility-administered medications for this visit.   Facility-Administered Medications Ordered in Other Visits  Medication Dose Route Frequency Provider Last Rate Last Dose  . 0.9 %  sodium chloride infusion   Intravenous Continuous [name], MD        Review of Systems : See HPI for pertinent positives and negatives.  Physical Examination  Filed Vitals:   11/25/15 1004 11/25/15 1009 11/25/15 1015  BP: 122/78 89/53 85/51   Pulse: 66    Temp: 97.7 F (36.5 C)    TempSrc: Oral    Resp: 20    Height: 5' 11.5" (1.816 m)    Weight: 316 lb 3.2 oz (143.427 kg)     Body mass index is 43.49 kg/(m^2).  General: WDWN morbidly obese male in NAD GAIT: normal Eyes: PERRLA Pulmonary: Respirations are non-labored, CTAB Cardiac: Regular rhythm. Pacemaker palpated left upper chest.  VASCULAR EXAM Carotid Bruits Left Right   Negative Negative     DORSALIS PEDIS 2+ palpable  2+ palpable   Aorta is not palpable. Radial pulses are 2+ palpable and equal.      Gastrointestinal: soft, nontender, BS WNL, no r/g, no palpable masses.  Musculoskeletal: No muscle atrophy/wasting. M/S 5/5 throughout except 4/5 in right LE, Extremities without ischemic changes.  Neurologic: A&O X 3; Appropriate Affect,  Speech is normal CN 2-12 intact, Pain and light touch intact in extremities, Motor exam as listed above.               Non-Invasive Vascular Imaging CAROTID DUPLEX 11/25/2015   CEREBROVASCULAR DUPLEX EVALUATION    INDICATION: Carotid artery disease     PREVIOUS  INTERVENTION(S): Right carotid endarterectomy 07/30/2008    DUPLEX EXAM:     RIGHT  LEFT  Peak Systolic Velocities (cm/s) End Diastolic Velocities (cm/s) Plaque LOCATION Peak Systolic Velocities (cm/s) End Diastolic Velocities (cm/s) Plaque  82 12  CCA PROXIMAL 76  13   70 12  CCA MID 49 12   39 9  CCA DISTAL 53 13   73 8  ECA 133 10   20 7  HT ICA PROXIMAL 43 11 HT  31 10  ICA MID 73 26 HT  65 25  ICA DISTAL 75 27      ICA / CCA Ratio (PSV)   Antegrade  Vertebral Flow Not Visualized    Brachial Systolic Pressure (mmHg)   Multiphasic (Subclavian artery) Brachial Artery Waveforms Stenotic (Subclavian artery)    Plaque Morphology:  HM = Homogeneous, HT = Heterogeneous, CP = Calcific Plaque, SP = Smooth Plaque, IP = Irregular Plaque     ADDITIONAL FINDINGS: Left subclavian artery velocity of 382 cm/s.    IMPRESSION: Patent right carotid endarterectomy site with no evidence of hyperplasia or restenosis.  Left internal carotid artery velocities suggest a <40% stenosis.     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      Assessment: Peter Dunlap is a 60 y.o. male who is status post right CEA in February 2010.  He has no history of stroke or MI. He reports occasional right arm numbness. Left arm systolic blood pressure is 23 mm Hg lower than the right arm. He does have a significant cardiac history.  Today's carotid duplex suggests right carotid endarterectomy site with no evidence of hyperplasia or restenosis and  left internal carotid artery velocities suggest a <40% stenosis. Left subclavian artery velocity of 382 cm/s. 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    Plan: Follow-up in 1 year with Carotid Duplex scan.   I discussed in depth with the patient the nature of atherosclerosis, and emphasized the importance of  maximal medical management including strict control of blood pressure, blood glucose, and lipid levels, obtaining regular exercise, and continued cessation of smoking.  The patient is aware that without maximal medical management the underlying atherosclerotic disease process will progress, limiting the benefit of any interventions. The patient was given information about stroke prevention and what symptoms should prompt the patient to seek immediate medical care. Thank you for allowing [place] to participate in this patient's care.  Clemon Chambers, RN, MSN, FNP-C [facility] Office: [phone]  Clinic Physician: Oneida Alar  11/25/2015 10:29 AM

## 2015-11-25 NOTE — Progress Notes (Signed)
Filed Vitals:   11/25/15 1004 11/25/15 1009 11/25/15 1015  BP: 122/78 89/53 85/51   Pulse: 66    Temp: 97.7 F (36.5 C)    TempSrc: Oral    Resp: 20    Height: 5' 11.5" (1.816 m)    Weight: 316 lb 3.2 oz (143.427 kg)      Pt. Stated his medications have changed but unable to give an updated list today.

## 2015-11-26 ENCOUNTER — Telehealth: Payer: Self-pay | Admitting: Family Medicine

## 2015-11-26 ENCOUNTER — Encounter (HOSPITAL_COMMUNITY): Payer: Self-pay | Admitting: Internal Medicine

## 2015-11-26 MED ORDER — OXYCODONE-ACETAMINOPHEN 5-325 MG PO TABS: 1.0000 | ORAL_TABLET | Freq: Four times a day (QID) | ORAL | Status: DC | PRN

## 2015-11-26 NOTE — Telephone Encounter (Signed)
Patient is calling to get rx for oxycodone  [phone]

## 2015-11-26 NOTE — Telephone Encounter (Signed)
Okay to refill? 

## 2015-11-26 NOTE — Telephone Encounter (Signed)
Ok to refill??  Last office visit 11/05/2015.  Last refill 10/27/2015.

## 2015-11-26 NOTE — Telephone Encounter (Signed)
Prescription printed and patient made aware to come to office to pick up after 2pm on 11/26/2015. 

## 2015-11-28 ENCOUNTER — Emergency Department (HOSPITAL_COMMUNITY)
Admission: EM | Admit: 2015-11-28 | Discharge: 2015-11-29 | Disposition: A | Discharge status: Home / Self Care | Payer: Medicare Other | Attending: Emergency Medicine | Admitting: Emergency Medicine

## 2015-11-28 ENCOUNTER — Encounter (HOSPITAL_COMMUNITY): Payer: Self-pay | Admitting: *Deleted

## 2015-11-28 ENCOUNTER — Emergency Department (HOSPITAL_COMMUNITY): Payer: Medicare Other

## 2015-11-28 DIAGNOSIS — M25461 Effusion, right knee: Secondary | ICD-10-CM | POA: Diagnosis not present

## 2015-11-28 DIAGNOSIS — Z87891 Personal history of nicotine dependence: Secondary | ICD-10-CM | POA: Insufficient documentation

## 2015-11-28 DIAGNOSIS — M109 Gout, unspecified: Secondary | ICD-10-CM

## 2015-11-28 DIAGNOSIS — Z8673 Personal history of transient ischemic attack (TIA), and cerebral infarction without residual deficits: Secondary | ICD-10-CM | POA: Insufficient documentation

## 2015-11-28 DIAGNOSIS — Z7982 Long term (current) use of aspirin: Secondary | ICD-10-CM | POA: Insufficient documentation

## 2015-11-28 DIAGNOSIS — Z955 Presence of coronary angioplasty implant and graft: Secondary | ICD-10-CM | POA: Diagnosis not present

## 2015-11-28 DIAGNOSIS — M10061 Idiopathic gout, right knee: Secondary | ICD-10-CM | POA: Insufficient documentation

## 2015-11-28 DIAGNOSIS — I10 Essential (primary) hypertension: Secondary | ICD-10-CM | POA: Insufficient documentation

## 2015-11-28 DIAGNOSIS — Z7901 Long term (current) use of anticoagulants: Secondary | ICD-10-CM | POA: Diagnosis not present

## 2015-11-28 DIAGNOSIS — M25561 Pain in right knee: Secondary | ICD-10-CM | POA: Diagnosis present

## 2015-11-28 DIAGNOSIS — Z950 Presence of cardiac pacemaker: Secondary | ICD-10-CM | POA: Diagnosis not present

## 2015-11-28 DIAGNOSIS — Z7984 Long term (current) use of oral hypoglycemic drugs: Secondary | ICD-10-CM | POA: Diagnosis not present

## 2015-11-28 DIAGNOSIS — Z79899 Other long term (current) drug therapy: Secondary | ICD-10-CM | POA: Insufficient documentation

## 2015-11-28 MED ORDER — OXYCODONE-ACETAMINOPHEN 5-325 MG PO TABS
1.0000 | ORAL_TABLET | Freq: Once | ORAL | Status: AC
  Administered 2015-11-28: 1 via ORAL
  Filled 2015-11-28: qty 1

## 2015-11-28 MED ORDER — LIDOCAINE HCL (PF) 1 % IJ SOLN
30.0000 mL | Freq: Once | INTRAMUSCULAR | Status: AC
  Administered 2015-11-28: 30 mL
  Filled 2015-11-28: qty 30

## 2015-11-28 MED ORDER — KETOROLAC TROMETHAMINE 60 MG/2ML IM SOLN
60.0000 mg | Freq: Once | INTRAMUSCULAR | Status: AC
  Administered 2015-11-28: 60 mg via INTRAMUSCULAR
  Filled 2015-11-28: qty 2

## 2015-11-28 MED ORDER — METHYLPREDNISOLONE SODIUM SUCC 125 MG IJ SOLR
60.0000 mg | Freq: Once | INTRAMUSCULAR | Status: AC
Start: 2015-11-28 — End: 2015-11-28
  Administered 2015-11-28: 60 mg via INTRAVENOUS
  Filled 2015-11-28: qty 2

## 2015-11-28 NOTE — ED Notes (Signed)
Pt reports right knee pain x 2 days. Hx of same but denies new injury.

## 2015-11-28 NOTE — ED Provider Notes (Signed)
CSN: [id number]     Arrival date & time 11/28/15  1813 History  By signing my name below, I, Irene Pap, attest that this documentation has been prepared under the direction and in the presence of Shawn Joy, PA-C. Electronically Signed: Irene Pap, ED Scribe. 11/28/2015. 8:39 PM.   Chief Complaint  Patient presents with  . Knee Pain   The history is provided by the patient. No language interpreter was used.  HPI Comments: Peter Dunlap is a 60 y.o. male with a hx of HTN, ASCVD, NSTEMI, and substance abuse who presents to the Emergency Department complaining of gradually worsening right knee pain onset two days ago. He reports associated swelling to the knee. Pt states that he stood up when he heard a pop in the knee and has had pain since. He has been using ice on the knee to no relief. Pt reports hx of similar symptoms 3 months ago, where he had to have fluid removed from the joint. He denies injury, color change, wound, fever/chills, neuro deficits, or any other complaints.  Orthopedist: Dr. Lorin Mercy  Past Medical History  Diagnosis Date  . Hypertension   . Arteriosclerotic cardiovascular disease (ASCVD) 2005    catheterization in 10/2010:50% mid LAD, diffuse distal disease, circumflex irregularities, large dominant RCA with a 50% ostial, 70% distal, 60% posterolateral and 70% PDA; normal EF  . Cerebrovascular disease 2010    R. carotid endarterectomy; Duplex in 10/2010-widely patent ICAs, subtotal left vertebral-not thought to be contributing to symptoms  . Hyperlipidemia   . Obesity   . Tobacco abuse     Quit 2014  . Benign prostatic hypertrophy   . Cervical spine disease     CT in 2012-advanced degeneration and spondylosis with moderate spinal stenosis at C3-C6  . H/O: substance abuse     Cocaine, marijuana, alcohol.  Quit 2013.   [name] Gastroesophageal reflux disease   . Depression   . Erectile dysfunction   . H/O hiatal hernia   . Arthritis   . Non-ST elevation myocardial  infarction (NSTEMI), initial episode of care [facility]) 12/02/2013    DES LAD  . Thyroid disease   . Sleep apnea     CPAP  . Tachy-brady syndrome ([place])     a. s/p STJ dual chamber PPM    Past Surgical History  Procedure Laterality Date  . Radiofrequency ablation  2005    for PSVT  . Coronary angioplasty with stent placement  12/03/2013    LAD 90%-->0% W/ Promus Premier DES 3.0 mm x 16 mm, CFX OK, RCA 40%, EF 70-75%  . Carotid endarterectomy Right Feb. 25, 2010     CEA  . Trudee Kuster hole Right 04/13/2014    Procedure: Haskell Flirt;  Surgeon: Charlie Pitter, MD;  Location: [place] NEURO ORS;  Service: Neurosurgery;  Laterality: Right;  . Left heart catheterization with coronary angiogram Left 12/03/2013    Procedure: LEFT HEART CATHETERIZATION WITH CORONARY ANGIOGRAM;  Surgeon: Leonie Man, MD;  Location: [facility] CATH LAB;  Service: Cardiovascular;  Laterality: Left;  . Percutaneous coronary stent intervention (pci-s)  12/03/2013    Procedure: PERCUTANEOUS CORONARY STENT INTERVENTION (PCI-S);  Surgeon: Leonie Man, MD;  Location: [facility] CATH LAB;  Service: Cardiovascular;;  . Left heart catheterization with coronary angiogram N/A 01/26/2014    Procedure: LEFT HEART CATHETERIZATION WITH CORONARY ANGIOGRAM;  Surgeon: Jettie Booze, MD;  Location: [facility] CATH LAB;  Service: Cardiovascular;  Laterality: N/A;  . Left heart catheterization with coronary angiogram N/A  08/03/2014    Procedure: LEFT HEART CATHETERIZATION WITH CORONARY ANGIOGRAM;  Surgeon: Burnell Blanks, MD;  Location: [facility] CATH LAB;  Service: Cardiovascular;  Laterality: N/A;  . Permanent pacemaker insertion N/A 09/18/2014    Procedure: PERMANENT PACEMAKER INSERTION;  Surgeon: [name], MD;  Location: [facility] CATH LAB;  Service: Cardiovascular;  Laterality: N/A;  . Brain surgery  2015    hematoma evacuation  . Tee without cardioversion N/A 07/27/2015    Procedure: TRANSESOPHAGEAL ECHOCARDIOGRAM (TEE);  Surgeon: Lelon Perla, MD;  Location: [facility]  ENDOSCOPY;  Service: Cardiovascular;  Laterality: N/A;  . Tee without cardioversion N/A 09/15/2015    Procedure: TRANSESOPHAGEAL ECHOCARDIOGRAM (TEE);  Surgeon: Thayer Headings, MD;  Location: [facility] ENDOSCOPY;  Service: Cardiovascular;  Laterality: N/A;  . Left atrial appendage occlusion N/A 08/05/2015    Procedure: LEFT ATRIAL APPENDAGE OCCLUSION;  Surgeon: [name], MD;  Location: [place] CV LAB;  Service: Cardiovascular;  Laterality: N/A;   Family History  Problem Relation Age of Onset  . Hypertension Mother     Cerebrovascular disease  . Diabetes Mother   . Coronary artery disease Father 59  . Diabetes type II Father   . Hypertension Father   . Heart attack Father   . Lung cancer Paternal Uncle   . Diabetes Sister   . Hypertension Sister   . Heart attack Sister 24  . Diabetes Brother   . Hypertension Brother    Social History  Substance Use Topics  . Smoking status: Former Smoker -- 1.00 packs/day for 40 years    Types: Cigarettes    Start date: 10/20/1972    Quit date: 10/10/2012  . Smokeless tobacco: Never Used     Comment: Quit in May.   . Alcohol Use: No     Comment: former drinker-- sober since 2013.     Review of Systems  Musculoskeletal: Positive for joint swelling and arthralgias.  Skin: Negative for color change and wound.  Neurological: Negative for weakness and numbness.   Allergies  Lactose intolerance (gi)  Home Medications   Prior to Admission medications   Medication Sig Start Date End Date Taking? Authorizing Provider  allopurinol (ZYLOPRIM) 100 MG tablet TAKE 1 TABLET BY MOUTH EVERY DAY 10/27/15   [name], MD  aspirin EC 325 MG EC tablet Take 1 tablet (325 mg total) by mouth daily. 09/21/15   Amber Sena Slate, NP  clopidogrel (PLAVIX) 75 MG tablet Take 1 tablet (75 mg total) by mouth daily. 09/21/15   Amber Sena Slate, NP  colchicine 0.6 MG tablet Take 1 tablet (0.6 mg total) by mouth daily. 10/27/15   [name], MD  colchicine 0.6 MG  tablet Take 1 tablet (0.6 mg total) by mouth daily. 11/29/15   Lorayne Bender, PA-C  DEXILANT 60 MG capsule TAKE 1 CAPSULE BY MOUTH EVERY DAY 11/24/15   [name], MD  furosemide (LASIX) 20 MG tablet TAKE 3 TABLETS BY MOUTH DAILY 10/21/15   [name], MD  isosorbide mononitrate (IMDUR) 60 MG 24 hr tablet TAKE 1 TABLET BY MOUTH EVERY DAY 02/10/15   Burtis Junes, NP  levothyroxine (SYNTHROID, LEVOTHROID) 175 MCG tablet TAKE 1 TABLET(175 MCG) BY MOUTH DAILY BEFORE BREAKFAST 11/02/15   Cassandria Anger, MD  lidocaine (XYLOCAINE) 5 % ointment Apply 1 application topically as directed. 07/26/15   Historical Provider, MD  Magnesium Oxide 400 MG CAPS Take 1 capsule (400 mg total) by mouth 2 (two) times daily. 08/27/15  Patsey Berthold, NP  metFORMIN (GLUCOPHAGE) 500 MG tablet TAKE 1 TABLET(500 MG) BY MOUTH TWICE DAILY WITH A MEAL 11/02/15   Cassandria Anger, MD  metoprolol tartrate (LOPRESSOR) 25 MG tablet TAKE 1 TABLET(25 MG) BY MOUTH TWICE DAILY 10/13/15   [name], MD  nitroGLYCERIN (NITROSTAT) 0.4 MG SL tablet Place 0.4 mg under the tongue every 5 (five) minutes as needed for chest pain (x 3 doses).    Historical Provider, MD  ONE TOUCH ULTRA TEST test strip USE TO TEST ONCE DAILY 07/23/15   [name], MD  oxyCODONE-acetaminophen (ROXICET) 5-325 MG tablet Take 1 tablet by mouth every 6 (six) hours as needed for severe pain. 11/26/15   [name], MD  potassium chloride SA (K-DUR,KLOR-CON) 20 MEQ tablet TAKE 1 TABLET BY MOUTH EVERY DAY 11/19/15   [name], MD  RAPAFLO 8 MG CAPS capsule TAKE 1 CAPSULE(8 MG) BY MOUTH DAILY 11/23/15   [name], MD  simvastatin (ZOCOR) 40 MG tablet Take 1 tablet (40 mg total) by mouth daily. 08/10/15   [name], MD  sotalol (BETAPACE) 80 MG tablet Take 1 tablet (80 mg total) by mouth 2 (two) times daily. 08/16/15   [name], MD  traZODone (DESYREL) 50 MG tablet TAKE 1/2 TO 1 TABLET BY MOUTH AT BEDTIME AS NEEDED FOR SLEEP 12/21/14    [name], MD   BP 119/70 mmHg  Pulse 85  Temp(Src) 98.3 F (36.8 C) (Oral)  Resp 18  SpO2 97% Physical Exam  Constitutional: He appears well-developed and well-nourished. No distress.  HENT:  Head: Normocephalic and atraumatic.  Eyes: Conjunctivae are normal.  Neck: Neck supple.  Cardiovascular: Normal rate, regular rhythm and intact distal pulses.   Pulmonary/Chest: Effort normal.  Musculoskeletal: He exhibits tenderness.  Swelling with possible effusion to the right knee; tenderness on the medial and lateral aspects; some increased warmth; ROM intact but painful.  Neurological: He is alert. He has normal reflexes.  No sensory deficits. Strength 5/5 in all extremities. Coordination intact.   Skin: Skin is warm and dry. He is not diaphoretic.  Psychiatric: He has a normal mood and affect. His behavior is normal.  Nursing note and vitals reviewed.   ED Course  .Joint Aspiration/Arthrocentesis Date/Time: 11/28/2015 10:41 PM Performed by: Lorayne Bender Authorized by: Lorayne Bender Consent: Verbal consent obtained. Risks and benefits: risks, benefits and alternatives were discussed Consent given by: patient Patient understanding: patient states understanding of the procedure being performed Patient consent: the patient's understanding of the procedure matches consent given Procedure consent: procedure consent matches procedure scheduled Patient identity confirmed: verbally with patient and arm band Time out: Immediately prior to procedure a "time out" was called to verify the correct patient, procedure, equipment, support staff and site/side marked as required. Indications: joint swelling,  diagnostic evaluation and pain  Body area: knee Joint: right knee Local anesthesia used: yes Anesthesia: local infiltration Local anesthetic: lidocaine 1% without epinephrine Anesthetic total: 2 ml Patient sedated: no Preparation: Patient was prepped and draped in the usual sterile  fashion. Needle gauge: 18 G Ultrasound guidance: no Approach: lateral Aspirate: yellow and blood-tinged Aspirate amount: 24 mL Methylprednisolone amount: 60 mg Lidocaine 1% amount: 3 ml Patient tolerance: Patient tolerated the procedure well with no immediate complications   (including critical care time) DIAGNOSTIC STUDIES: Oxygen Saturation is 97% on RA, normal by my interpretation.    COORDINATION OF CARE: 8:28 PM-Discussed treatment plan which includes x-ray and  fluid removal with pt at bedside and pt agreed to plan.    Labs Review Labs Reviewed  SYNOVIAL CELL COUNT + DIFF, W/ CRYSTALS - Abnormal; Notable for the following:    Appearance-Synovial CLOUDY (*)    Neutrophil, Synovial 89 (*)    Monocyte-Macrophage-Synovial Fluid 10 (*)    All other components within normal limits  GRAM STAIN  CULTURE, BODY FLUID-BOTTLE    Imaging Review Dg Knee Complete 4 Views Right  11/28/2015  CLINICAL DATA:  Swelling and pain to the RIGHT knee. EXAM: RIGHT KNEE - COMPLETE 4+ VIEW COMPARISON:  None. FINDINGS: There is a moderate suprapatellar joint effusion. No evidence of fracture or dislocation. IMPRESSION: Moderate- joint effusion. Electronically Signed   By: Suzy Bouchard M.D.   On: 11/28/2015 21:22   I have personally reviewed and evaluated these images and lab results as part of my medical decision-making.   EKG Interpretation None      MDM    Final diagnoses:  Acute gout of right knee, unspecified cause    Peter Dunlap presents with right knee pain and effusion for the last 2 days.  Patient's effusion, increased warmth, and pain give evidence for arthrocentesis and synovial fluid analysis. Septic joint low on the differential, but needs to be ruled out. Gout is also a possibility. Patient voiced significant improvement following the arthrocentesis. Notable decrease in the patient's effusion. Knee sleeve placed to limit any postprocedure swelling. Synovial fluid  analysis reveals urate crystals, suggestive of gout. No organisms seen. Colchicine and NSAID therapy discussed. Patient follow-up with orthopedics tomorrow morning. Return precautions discussed. Patient voiced understanding of these instructions and is comfortable with discharge.  I personally performed the services described in this documentation, which was scribed in my presence. The recorded information has been reviewed and is accurate.    Lorayne Bender, PA-C 11/29/15 [place], MD 11/30/15 [phone]

## 2015-11-29 DIAGNOSIS — M10061 Idiopathic gout, right knee: Secondary | ICD-10-CM | POA: Diagnosis not present

## 2015-11-29 LAB — SYNOVIAL CELL COUNT + DIFF, W/ CRYSTALS
Lymphocytes-Synovial Fld: 1 % (ref 0–20)
Monocyte-Macrophage-Synovial Fluid: 10 % — ABNORMAL LOW (ref 50–90)
Neutrophil, Synovial: 89 % — ABNORMAL HIGH (ref 0–25)
WBC, Synovial: UNDETERMINED /mm3 (ref 0–200)

## 2015-11-29 LAB — GRAM STAIN

## 2015-11-29 MED ORDER — COLCHICINE 0.6 MG PO TABS: 0.6000 mg | ORAL_TABLET | Freq: Every day | ORAL | Status: DC

## 2015-11-29 MED ORDER — COLCHICINE 0.6 MG PO TABS
1.2000 mg | ORAL_TABLET | Freq: Once | ORAL | Status: AC
Start: 2015-11-29 — End: 2015-11-29
  Administered 2015-11-29: 1.2 mg via ORAL
  Filled 2015-11-29: qty 2

## 2015-11-29 NOTE — ED Notes (Signed)
Pt departed in NAD.  

## 2015-11-29 NOTE — Discharge Instructions (Signed)
You have been seen today for knee pain. The joint effusion was drained. The lab tests suggest gout. You should still follow up with the orthopedic surgeon as soon as possible. Take the colchicine until symptoms resolve. Follow up with PCP as needed. Return to ED should symptoms worsen.

## 2015-11-30 ENCOUNTER — Other Ambulatory Visit: Payer: Self-pay

## 2015-11-30 LAB — MISC LABCORP TEST (SEND OUT)
Labcorp test code: 19497
Labcorp test code: 19505
Labcorp test code: 19588
Source (LabCorp): 1
Source (LabCorp): 2

## 2015-11-30 NOTE — Progress Notes (Signed)
Watchman Consult Note   Date:  12/01/2015   ID:  Peter Dunlap, DOB [birth year], MRN [id number]  PCP:  [name], MD  Primary Electrophysiologist: Lovena Le  CC: follow up AF and Watchman   History of Present Illness: Peter Dunlap is a 60 y.o. male who presents today for AF and Watchman follow up. Since last being seen in clinic, he reports doing very well.  He denies symptoms of shortness of breath, orthopnea, PND, lower extremity edema, claudication, dizziness, presyncope, syncope, bleeding, or neurologic sequela. + gout.  The patient is tolerating medications without difficulties and is otherwise without complaint today.   Past Medical History  Diagnosis Date  . Hypertension   . Arteriosclerotic cardiovascular disease (ASCVD) 2005    catheterization in 10/2010:50% mid LAD, diffuse distal disease, circumflex irregularities, large dominant RCA with a 50% ostial, 70% distal, 60% posterolateral and 70% PDA; normal EF  . Cerebrovascular disease 2010    R. carotid endarterectomy; Duplex in 10/2010-widely patent ICAs, subtotal left vertebral-not thought to be contributing to symptoms  . Hyperlipidemia   . Obesity   . Tobacco abuse     Quit 2014  . Benign prostatic hypertrophy   . Cervical spine disease     CT in 2012-advanced degeneration and spondylosis with moderate spinal stenosis at C3-C6  . H/O: substance abuse     Cocaine, marijuana, alcohol.  Quit 2013.   [name] Gastroesophageal reflux disease   . Depression   . Erectile dysfunction   . H/O hiatal hernia   . Arthritis   . Non-ST elevation myocardial infarction (NSTEMI), initial episode of care [facility]) 12/02/2013    DES LAD  . Thyroid disease   . Sleep apnea     CPAP  . Tachy-brady syndrome ([place])     a. s/p STJ dual chamber PPM    Past Surgical History  Procedure Laterality Date  . Radiofrequency ablation  2005    for PSVT  . Coronary angioplasty with stent placement  12/03/2013    LAD 90%-->0% W/ Promus Premier  DES 3.0 mm x 16 mm, CFX OK, RCA 40%, EF 70-75%  . Carotid endarterectomy Right Feb. 25, 2010     CEA  . Trudee Kuster hole Right 04/13/2014    Procedure: Haskell Flirt;  Surgeon: Charlie Pitter, MD;  Location: [place] NEURO ORS;  Service: Neurosurgery;  Laterality: Right;  . Left heart catheterization with coronary angiogram Left 12/03/2013    Procedure: LEFT HEART CATHETERIZATION WITH CORONARY ANGIOGRAM;  Surgeon: Leonie Man, MD;  Location: [facility] CATH LAB;  Service: Cardiovascular;  Laterality: Left;  . Percutaneous coronary stent intervention (pci-s)  12/03/2013    Procedure: PERCUTANEOUS CORONARY STENT INTERVENTION (PCI-S);  Surgeon: Leonie Man, MD;  Location: [facility] CATH LAB;  Service: Cardiovascular;;  . Left heart catheterization with coronary angiogram N/A 01/26/2014    Procedure: LEFT HEART CATHETERIZATION WITH CORONARY ANGIOGRAM;  Surgeon: Jettie Booze, MD;  Location: [facility] CATH LAB;  Service: Cardiovascular;  Laterality: N/A;  . Left heart catheterization with coronary angiogram N/A 08/03/2014    Procedure: LEFT HEART CATHETERIZATION WITH CORONARY ANGIOGRAM;  Surgeon: Burnell Blanks, MD;  Location: [facility] CATH LAB;  Service: Cardiovascular;  Laterality: N/A;  . Permanent pacemaker insertion N/A 09/18/2014    Procedure: PERMANENT PACEMAKER INSERTION;  Surgeon: [name], MD;  Location: [facility] CATH LAB;  Service: Cardiovascular;  Laterality: N/A;  . Brain surgery  2015    hematoma evacuation  . Tee without cardioversion N/A 07/27/2015  Procedure: TRANSESOPHAGEAL ECHOCARDIOGRAM (TEE);  Surgeon: Lelon Perla, MD;  Location: [facility] ENDOSCOPY;  Service: Cardiovascular;  Laterality: N/A;  . Tee without cardioversion N/A 09/15/2015    Procedure: TRANSESOPHAGEAL ECHOCARDIOGRAM (TEE);  Surgeon: Thayer Headings, MD;  Location: [facility] ENDOSCOPY;  Service: Cardiovascular;  Laterality: N/A;  . Left atrial appendage occlusion N/A 08/05/2015    Procedure: LEFT ATRIAL APPENDAGE OCCLUSION;  Surgeon: [name], MD;   Location: [place] CV LAB;  Service: Cardiovascular;  Laterality: N/A;     Current Outpatient Prescriptions  Medication Sig Dispense Refill  . allopurinol (ZYLOPRIM) 100 MG tablet TAKE 1 TABLET BY MOUTH EVERY DAY 90 tablet 6  . aspirin EC 325 MG EC tablet Take 1 tablet (325 mg total) by mouth daily.    . clopidogrel (PLAVIX) 75 MG tablet Take 1 tablet (75 mg total) by mouth daily. 90 tablet 3  . colchicine 0.6 MG tablet Take 1 tablet (0.6 mg total) by mouth daily. 8 tablet 1  . colchicine 0.6 MG tablet Take 1 tablet (0.6 mg total) by mouth daily. 15 tablet 0  . DEXILANT 60 MG capsule TAKE 1 CAPSULE BY MOUTH EVERY DAY 30 capsule 0  . furosemide (LASIX) 20 MG tablet TAKE 3 TABLETS BY MOUTH DAILY 90 tablet 6  . isosorbide mononitrate (IMDUR) 60 MG 24 hr tablet TAKE 1 TABLET BY MOUTH EVERY DAY 90 tablet 2  . levothyroxine (SYNTHROID, LEVOTHROID) 175 MCG tablet TAKE 1 TABLET(175 MCG) BY MOUTH DAILY BEFORE BREAKFAST 30 tablet 2  . lidocaine (XYLOCAINE) 5 % ointment Apply 1 application topically as directed.    . Magnesium Oxide 400 MG CAPS Take 1 capsule (400 mg total) by mouth 2 (two) times daily. 60 capsule 11  . metFORMIN (GLUCOPHAGE) 500 MG tablet TAKE 1 TABLET(500 MG) BY MOUTH TWICE DAILY WITH A MEAL 60 tablet 2  . metoprolol tartrate (LOPRESSOR) 25 MG tablet TAKE 1 TABLET(25 MG) BY MOUTH TWICE DAILY 180 tablet 3  . nitroGLYCERIN (NITROSTAT) 0.4 MG SL tablet Place 0.4 mg under the tongue every 5 (five) minutes as needed for chest pain (x 3 doses).    . ONE TOUCH ULTRA TEST test strip USE TO TEST ONCE DAILY 50 each 11  . oxyCODONE-acetaminophen (ROXICET) 5-325 MG tablet Take 1 tablet by mouth every 6 (six) hours as needed for severe pain. 30 tablet 0  . potassium chloride SA (K-DUR,KLOR-CON) 20 MEQ tablet TAKE 1 TABLET BY MOUTH EVERY DAY 30 tablet 6  . RAPAFLO 8 MG CAPS capsule TAKE 1 CAPSULE(8 MG) BY MOUTH DAILY 30 capsule 6  . simvastatin (ZOCOR) 40 MG tablet Take 1 tablet (40 mg total) by  mouth daily. 30 tablet 3  . sotalol (BETAPACE) 80 MG tablet Take 1 tablet (80 mg total) by mouth 2 (two) times daily. 60 tablet 6  . traZODone (DESYREL) 50 MG tablet TAKE 1/2 TO 1 TABLET BY MOUTH AT BEDTIME AS NEEDED FOR SLEEP 30 tablet 3   No current facility-administered medications for this visit.   Facility-Administered Medications Ordered in Other Visits  Medication Dose Route Frequency Provider Last Rate Last Dose  . 0.9 %  sodium chloride infusion   Intravenous Continuous [name], MD        Allergies:   Lactose intolerance (gi)   Social History:  The patient  reports that he quit smoking about 3 years ago. His smoking use included Cigarettes. He started smoking about 43 years ago. He has a 40 pack-year smoking history. He has never  used smokeless tobacco. He reports that he does not drink alcohol or use illicit drugs.   Family History:  The patient's family history includes Coronary artery disease (age of onset: 64) in his father; Diabetes in his brother, mother, and sister; Diabetes type II in his father; Heart attack in his father; Heart attack (age of onset: 60) in his sister; Hypertension in his brother, father, mother, and sister; Lung cancer in his paternal uncle.    ROS:  Please see the history of present illness.   All other systems are reviewed and negative.    PHYSICAL EXAM: VS:  BP 96/68 mmHg  Pulse 83  Ht 5\' 11"  (1.803 m)  Wt 311 lb 3.2 oz (141.159 kg)  BMI 43.42 kg/m2 , BMI Body mass index is 43.42 kg/(m^2). GEN: Obese, well developed, in no acute distress HEENT: normal Neck: no JVD, carotid bruits, or masses Cardiac: RRR; 2/6 SEM Respiratory:  clear to auscultation bilaterally, normal work of breathing GI: obese, nontender, nondistended, + BS MS: no deformity or atrophy Skin: warm and dry  Neuro:  Strength and sensation are intact Psych: euthymic mood, full affect  EKG:  EKG is ordered today. EKG today shows afib 83 bpm, demand V pacing, LVH,  nonspecific ST/ T changes, Qtc 484 msec  Recent Labs: 12/19/2014: B Natriuretic Peptide 77.2 08/18/2015: Magnesium 1.6 10/12/2015: TSH 1.78 11/05/2015: ALT 15; BUN 13; Creat 1.34*; Hemoglobin 16.9; Platelets 245; Potassium 3.9; Sodium 137    Lipid Panel     Component Value Date/Time   CHOL 151 11/05/2015 0848   TRIG 199* 11/05/2015 0848   HDL 35* 11/05/2015 0848   CHOLHDL 4.3 11/05/2015 0848   VLDL 40* 11/05/2015 0848   LDLCALC 76 11/05/2015 0848     Wt Readings from Last 3 Encounters:  12/01/15 311 lb 3.2 oz (141.159 kg)  11/25/15 316 lb 3.2 oz (143.427 kg)  11/05/15 307 lb (139.254 kg)      ASSESSMENT AND PLAN:  1.  Paroxysmal atrial fibrillation Doing well post Watchman 6 week TEE showed good device seal Continue ASA and Plavix until 6 months post procedure (02/2016) then discontinue Plavix Continue Sotalol 80mg  bid today for rhythm control.  QTc stable.  Pacemaker interrogation reveals af burden 21%.  May need to increase sotalol upon return. BMET, Mg today  2.  Morbid obesity Weight loss advised  3.  HTN Stable No change required today  4.  CAD No recent ischemic symptoms  5.  OSA Compliance with CPAP advised   6. Tachy/brady syndrome Normal pacemaker function See Pace Art report Mode switch lower pacing rate reduced to 60 bpm today   Follow-up:  With EP NP first week of September Follow-up with Dr Lovena Le thereafter and I will see as needed.  Current medicines are reviewed at length with the patient today.   The patient does not have concerns regarding his medicines.  The following changes were made today:  none  Labs 6/17 reviewed  SignedThompson Grayer MD  12/01/2015 3:59 PM     [address] [place] [address] [phone] (office) [phone] (fax)

## 2015-12-01 ENCOUNTER — Encounter: Payer: Self-pay | Admitting: Internal Medicine

## 2015-12-01 ENCOUNTER — Ambulatory Visit (INDEPENDENT_AMBULATORY_CARE_PROVIDER_SITE_OTHER): Payer: Medicare Other | Admitting: Internal Medicine

## 2015-12-01 VITALS — BP 96/68 | HR 83 | Ht 71.0 in | Wt 311.2 lb

## 2015-12-01 DIAGNOSIS — I480 Paroxysmal atrial fibrillation: Secondary | ICD-10-CM

## 2015-12-01 DIAGNOSIS — I679 Cerebrovascular disease, unspecified: Secondary | ICD-10-CM | POA: Diagnosis not present

## 2015-12-01 LAB — CUP PACEART INCLINIC DEVICE CHECK
Battery Remaining Longevity: 126
Battery Voltage: 2.99 V
Brady Statistic RA Percent Paced: 10 %
Brady Statistic RV Percent Paced: 13 %
Date Time Interrogation Session: 20170628171148
Implantable Lead Implant Date: 20160415
Implantable Lead Implant Date: 20160415
Implantable Lead Location: 753859
Implantable Lead Location: 753860
Lead Channel Impedance Value: 450 Ohm
Lead Channel Impedance Value: 587.5 Ohm
Lead Channel Pacing Threshold Amplitude: 1 V
Lead Channel Pacing Threshold Amplitude: 1 V
Lead Channel Pacing Threshold Pulse Width: 0.5 ms
Lead Channel Pacing Threshold Pulse Width: 0.5 ms
Lead Channel Sensing Intrinsic Amplitude: 12 mV
Lead Channel Sensing Intrinsic Amplitude: 5 mV
Lead Channel Setting Pacing Amplitude: 2 V
Lead Channel Setting Pacing Amplitude: 2.5 V
Lead Channel Setting Pacing Pulse Width: 0.5 ms
Lead Channel Setting Sensing Sensitivity: 2 mV
Pulse Gen Model: 2240
Pulse Gen Serial Number: 7756161

## 2015-12-01 NOTE — Patient Instructions (Signed)

## 2015-12-02 ENCOUNTER — Other Ambulatory Visit: Payer: Self-pay | Admitting: Family Medicine

## 2015-12-03 LAB — CULTURE, BODY FLUID W GRAM STAIN -BOTTLE: Culture: NO GROWTH

## 2015-12-03 LAB — CULTURE, BODY FLUID-BOTTLE

## 2015-12-09 ENCOUNTER — Other Ambulatory Visit: Payer: Self-pay

## 2015-12-09 NOTE — Patient Outreach (Signed)
[facility]) Care Management  [place]  12/09/2015   DIEZEL SCHORR 11-22-1955 [id number]  Subjective: Telephone call to patient for monthly call.  Patient reports he is doing ok. He reports he had some trouble with his knee bothering him and he went to the emergency room and had some fluid drawn off. Patient reports that it is 100% better.  He denies pain presently.  Patient reports that his blood sugars are in the 90's.  Discussed with patient diabetic diet and trying to keep sugars under control. He verbalized understanding.   Objective:   Encounter Medications:  Outpatient Encounter Prescriptions as of 12/09/2015  Medication Sig  . allopurinol (ZYLOPRIM) 100 MG tablet TAKE 1 TABLET BY MOUTH EVERY DAY  . aspirin EC 325 MG EC tablet Take 1 tablet (325 mg total) by mouth daily.  . clopidogrel (PLAVIX) 75 MG tablet Take 1 tablet (75 mg total) by mouth daily.  . colchicine 0.6 MG tablet Take 1 tablet (0.6 mg total) by mouth daily.  [name] DEXILANT 60 MG capsule TAKE 1 CAPSULE BY MOUTH EVERY DAY  . furosemide (LASIX) 20 MG tablet TAKE 3 TABLETS BY MOUTH DAILY  . isosorbide mononitrate (IMDUR) 60 MG 24 hr tablet TAKE 1 TABLET BY MOUTH EVERY DAY  . levothyroxine (SYNTHROID, LEVOTHROID) 175 MCG tablet TAKE 1 TABLET(175 MCG) BY MOUTH DAILY BEFORE BREAKFAST  . lidocaine (XYLOCAINE) 5 % ointment Apply 1 application topically as directed.  . Magnesium Oxide 400 MG CAPS Take 1 capsule (400 mg total) by mouth 2 (two) times daily.  . metFORMIN (GLUCOPHAGE) 500 MG tablet TAKE 1 TABLET(500 MG) BY MOUTH TWICE DAILY WITH A MEAL  . metoprolol tartrate (LOPRESSOR) 25 MG tablet TAKE 1 TABLET(25 MG) BY MOUTH TWICE DAILY  . nitroGLYCERIN (NITROSTAT) 0.4 MG SL tablet Place 0.4 mg under the tongue every 5 (five) minutes as needed for chest pain (x 3 doses).  . ONE TOUCH ULTRA TEST test strip USE TO TEST ONCE DAILY  . oxyCODONE-acetaminophen (ROXICET) 5-325 MG tablet Take 1 tablet by mouth  every 6 (six) hours as needed for severe pain.  . potassium chloride SA (K-DUR,KLOR-CON) 20 MEQ tablet TAKE 1 TABLET BY MOUTH EVERY DAY  . RAPAFLO 8 MG CAPS capsule TAKE 1 CAPSULE(8 MG) BY MOUTH DAILY  . simvastatin (ZOCOR) 40 MG tablet TAKE 1 TABLET(40 MG) BY MOUTH DAILY  . sotalol (BETAPACE) 80 MG tablet Take 1 tablet (80 mg total) by mouth 2 (two) times daily.  . traZODone (DESYREL) 50 MG tablet TAKE 1/2 TO 1 TABLET BY MOUTH AT BEDTIME AS NEEDED FOR SLEEP  . colchicine 0.6 MG tablet Take 1 tablet (0.6 mg total) by mouth daily. (Patient not taking: Reported on 12/09/2015)   Facility-Administered Encounter Medications as of 12/09/2015  Medication  . 0.9 %  sodium chloride infusion    Functional Status:  In your present state of health, do you have any difficulty performing the following activities: 08/05/2015 08/05/2015  Hearing? - N  Vision? - N  Difficulty concentrating or making decisions? - N  Walking or climbing stairs? - N  Dressing or bathing? - N  Doing errands, shopping? N -  Preparing Food and eating ? - -  Using the Toilet? - -  In the past six months, have you accidently leaked urine? - -  Do you have problems with loss of bowel control? - -  Managing your Medications? - -  Managing your Finances? - -  Housekeeping or managing your  Housekeeping? - -    Fall/Depression Screening: PHQ 2/9 Scores 12/09/2015 11/11/2015 11/05/2015 11/05/2015 10/25/2015 10/25/2015 09/28/2015  PHQ - 2 Score 0 0 0 0 0 0 0  PHQ- 9 Score - - - 0 - - -    Assessment: Patient continues to benefit from health coach outreach for disease management and support.    Plan:  [facility] CM Care Plan Problem One        Most Recent Value   Care Plan Problem One  Diabetes Knowledge deficit   Role Documenting the Problem One  Health Coach   Care Plan for Problem One  Active   THN Long Term Goal (31-90 days)  Patient will verbalize care for diabetes such as diet  and blood sugar monitoring within the next 90 days.   THN Long  Term Goal Start Date  10/14/15   Interventions for Problem One [place] reiterated with patient foods high in carbohydrates and reasons to monitor blood sugar regularly.      RN Health Coach will contact patient within one month and patient agrees to next outreach.  [name], RN, MSN [place] [phone]

## 2015-12-21 ENCOUNTER — Ambulatory Visit (INDEPENDENT_AMBULATORY_CARE_PROVIDER_SITE_OTHER): Payer: Medicare Other | Admitting: Urology

## 2015-12-21 ENCOUNTER — Other Ambulatory Visit (HOSPITAL_COMMUNITY)
Admission: RE | Admit: 2015-12-21 | Discharge: 2015-12-21 | Disposition: A | Discharge status: Home / Self Care | Payer: Medicare Other | Source: Ambulatory Visit | Attending: Urology | Admitting: Urology

## 2015-12-21 DIAGNOSIS — N401 Enlarged prostate with lower urinary tract symptoms: Secondary | ICD-10-CM | POA: Diagnosis not present

## 2015-12-21 DIAGNOSIS — R3121 Asymptomatic microscopic hematuria: Secondary | ICD-10-CM | POA: Diagnosis not present

## 2015-12-21 DIAGNOSIS — R972 Elevated prostate specific antigen [PSA]: Secondary | ICD-10-CM | POA: Insufficient documentation

## 2015-12-21 LAB — URINALYSIS, ROUTINE W REFLEX MICROSCOPIC
Bilirubin Urine: NEGATIVE
Glucose, UA: NEGATIVE mg/dL
Nitrite: NEGATIVE
Protein, ur: 30 mg/dL — AB
Specific Gravity, Urine: 1.025 (ref 1.005–1.030)
pH: 6 (ref 5.0–8.0)

## 2015-12-21 LAB — URINE MICROSCOPIC-ADD ON

## 2015-12-22 ENCOUNTER — Other Ambulatory Visit: Payer: Self-pay | Admitting: Family Medicine

## 2015-12-22 LAB — URINE CULTURE: Culture: NO GROWTH

## 2015-12-24 ENCOUNTER — Other Ambulatory Visit: Payer: Self-pay | Admitting: Urology

## 2015-12-24 DIAGNOSIS — R972 Elevated prostate specific antigen [PSA]: Secondary | ICD-10-CM

## 2015-12-27 ENCOUNTER — Other Ambulatory Visit: Payer: Self-pay | Admitting: Family Medicine

## 2015-12-27 MED ORDER — OXYCODONE-ACETAMINOPHEN 5-325 MG PO TABS: 1.0000 | ORAL_TABLET | Freq: Four times a day (QID) | ORAL | 0 refills | Status: DC | PRN

## 2015-12-27 NOTE — Telephone Encounter (Signed)
Okay to refill? 

## 2015-12-27 NOTE — Telephone Encounter (Signed)
Ok to refill??  Last office visit 11/05/2015.  Last refill 11/26/2015.

## 2015-12-27 NOTE — Telephone Encounter (Signed)
Prescription printed and patient made aware to come to office to pick up on 12/28/2015.

## 2015-12-29 ENCOUNTER — Telehealth: Payer: Self-pay | Admitting: Family Medicine

## 2015-12-29 NOTE — Telephone Encounter (Signed)
Okay to order, use gait instability, arthritis of knee, gout

## 2015-12-29 NOTE — Telephone Encounter (Signed)
Prescription faxed.   Call placed to patient and patient made aware per VM. 

## 2015-12-29 NOTE — Telephone Encounter (Signed)
Call placed to patient to inquire.   Reports that he has been having frequent intermittent gout flare ups approximately once every month. States that when he is in flare, he has increased R knee and L ankle pain that causes him extreme discomfort while walking. States that he has been using medication as prescribed.   Reports that he uses crutches while in flare to assist with ambulation, but crutches are causing chest discomfort under arms. Requested order for rollator (4 wheeled rolling walker) to be faxed to [place] [phone]~telephone/ [phone]~fax.   MD please advise.

## 2015-12-29 NOTE — Telephone Encounter (Signed)
[facility]  Patient is calling to get rx for a walker if possible the crutches he is using are hurting him  [phone]

## 2015-12-30 NOTE — Telephone Encounter (Signed)
Received phone call from patient.   Reports that [facility] does not accept Medicaid.   Requested to have prescription faxed to [facility].   New prescription printed and will be faxed on 12/31/2015 after MD signature.

## 2015-12-31 DIAGNOSIS — M10071 Idiopathic gout, right ankle and foot: Secondary | ICD-10-CM | POA: Diagnosis not present

## 2015-12-31 DIAGNOSIS — M10061 Idiopathic gout, right knee: Secondary | ICD-10-CM | POA: Diagnosis not present

## 2015-12-31 DIAGNOSIS — M1711 Unilateral primary osteoarthritis, right knee: Secondary | ICD-10-CM | POA: Diagnosis not present

## 2015-12-31 DIAGNOSIS — R269 Unspecified abnormalities of gait and mobility: Secondary | ICD-10-CM | POA: Diagnosis not present

## 2016-01-04 ENCOUNTER — Other Ambulatory Visit: Payer: Self-pay

## 2016-01-04 NOTE — Patient Outreach (Signed)
[facility]) Care Management  [place]  01/04/2016   Peter Dunlap 08/08/1955 [id number]  Subjective: Telephone call to patient for monthly call. Patient reports he is doing ok. He reports some problems with his gout flaring up at times.  He reports he takes his colchicine when he has a flare up and takes allopurinol daily. Patient reports he uses a rollator when his pain is bad but presently he is not using any assistive devices.  Patient reports he had some blood in his urine on his annual physical and he is to have a prostate biopsy on 8-15. Patient reports that his sugars have been in the 90's. Discussed importance of maintaining his diet in order to control his sugars. He verbalized understanding.   Objective:   Encounter Medications:  Outpatient Encounter Prescriptions as of 01/04/2016  Medication Sig  . allopurinol (ZYLOPRIM) 100 MG tablet TAKE 1 TABLET BY MOUTH EVERY DAY  . aspirin EC 325 MG EC tablet Take 1 tablet (325 mg total) by mouth daily.  . clopidogrel (PLAVIX) 75 MG tablet Take 1 tablet (75 mg total) by mouth daily.  [name] DEXILANT 60 MG capsule TAKE 1 CAPSULE BY MOUTH EVERY DAY  . furosemide (LASIX) 20 MG tablet TAKE 3 TABLETS BY MOUTH DAILY  . isosorbide mononitrate (IMDUR) 60 MG 24 hr tablet TAKE 1 TABLET BY MOUTH EVERY DAY  . levothyroxine (SYNTHROID, LEVOTHROID) 175 MCG tablet TAKE 1 TABLET(175 MCG) BY MOUTH DAILY BEFORE BREAKFAST  . lidocaine (XYLOCAINE) 5 % ointment Apply 1 application topically as directed.  . Magnesium Oxide 400 MG CAPS Take 1 capsule (400 mg total) by mouth 2 (two) times daily.  . metFORMIN (GLUCOPHAGE) 500 MG tablet TAKE 1 TABLET(500 MG) BY MOUTH TWICE DAILY WITH A MEAL  . metoprolol tartrate (LOPRESSOR) 25 MG tablet TAKE 1 TABLET(25 MG) BY MOUTH TWICE DAILY  . nitroGLYCERIN (NITROSTAT) 0.4 MG SL tablet Place 0.4 mg under the tongue every 5 (five) minutes as needed for chest pain (x 3 doses).  . ONE TOUCH ULTRA TEST test  strip USE TO TEST ONCE DAILY  . oxyCODONE-acetaminophen (ROXICET) 5-325 MG tablet Take 1 tablet by mouth every 6 (six) hours as needed for severe pain.  . potassium chloride SA (K-DUR,KLOR-CON) 20 MEQ tablet TAKE 1 TABLET BY MOUTH EVERY DAY  . RAPAFLO 8 MG CAPS capsule TAKE 1 CAPSULE(8 MG) BY MOUTH DAILY  . simvastatin (ZOCOR) 40 MG tablet TAKE 1 TABLET(40 MG) BY MOUTH DAILY  . sotalol (BETAPACE) 80 MG tablet Take 1 tablet (80 mg total) by mouth 2 (two) times daily.  . traZODone (DESYREL) 50 MG tablet TAKE 1/2 TO 1 TABLET BY MOUTH AT BEDTIME AS NEEDED FOR SLEEP  . colchicine 0.6 MG tablet Take 1 tablet (0.6 mg total) by mouth daily. (Patient not taking: Reported on 12/09/2015)  . colchicine 0.6 MG tablet Take 1 tablet (0.6 mg total) by mouth daily. (Patient not taking: Reported on 01/04/2016)   Facility-Administered Encounter Medications as of 01/04/2016  Medication  . 0.9 %  sodium chloride infusion    Functional Status:  In your present state of health, do you have any difficulty performing the following activities: 08/05/2015 08/05/2015  Hearing? - N  Vision? - N  Difficulty concentrating or making decisions? - N  Walking or climbing stairs? - N  Dressing or bathing? - N  Doing errands, shopping? N -  Preparing Food and eating ? - -  Using the Toilet? - -  In the  past six months, have you accidently leaked urine? - -  Do you have problems with loss of bowel control? - -  Managing your Medications? - -  Managing your Finances? - -  Housekeeping or managing your Housekeeping? - -  Some recent data might be hidden    Fall/Depression Screening: PHQ 2/9 Scores 01/04/2016 12/09/2015 11/11/2015 11/05/2015 11/05/2015 10/25/2015 10/25/2015  PHQ - 2 Score 0 0 0 0 0 0 0  PHQ- 9 Score - - - - 0 - -    Assessment: Patient continues to benefit from health coach outreach for disease management and support.    Plan:  [facility] CM Care Plan Problem One   Flowsheet Row Most Recent Value  Care Plan Problem One   Diabetes Knowledge deficit  Role Documenting the Problem One  Health Coach  Care Plan for Problem One  Active  THN Long Term Goal (31-90 days)  Patient will verbalize care for diabetes such as diet  and blood sugar monitoring within the next 90 days.  THN Long Term Goal Start Date  01/04/16  Interventions for Problem One [place] reviewed with patient foods high in carbohydrates and reasons to monitor blood sugar regularly.      RN Health Coach will contact patient in the month of September and patient agrees to next outreach.  [name], RN, MSN [place] [phone]

## 2016-01-18 ENCOUNTER — Ambulatory Visit (HOSPITAL_COMMUNITY)
Admission: RE | Admit: 2016-01-18 | Discharge: 2016-01-18 | Disposition: A | Discharge status: Home / Self Care | Payer: Medicare Other | Source: Ambulatory Visit | Attending: Urology | Admitting: Urology

## 2016-01-18 ENCOUNTER — Ambulatory Visit (HOSPITAL_COMMUNITY): Admission: RE | Admit: 2016-01-18 | Payer: Medicare Other | Source: Ambulatory Visit

## 2016-01-18 ENCOUNTER — Encounter (HOSPITAL_COMMUNITY): Payer: Self-pay

## 2016-01-18 DIAGNOSIS — C61 Malignant neoplasm of prostate: Secondary | ICD-10-CM | POA: Diagnosis not present

## 2016-01-18 DIAGNOSIS — R972 Elevated prostate specific antigen [PSA]: Secondary | ICD-10-CM

## 2016-01-18 MED ORDER — LIDOCAINE HCL (PF) 2 % IJ SOLN
INTRAMUSCULAR | Status: AC
  Administered 2016-01-18: 10 mL
  Filled 2016-01-18: qty 10

## 2016-01-18 MED ORDER — LIDOCAINE HCL (PF) 2 % IJ SOLN
10.0000 mL | Freq: Once | INTRAMUSCULAR | Status: AC
Start: 2016-01-18 — End: 2016-01-18
  Administered 2016-01-18: 10 mL

## 2016-01-18 MED ORDER — GENTAMICIN SULFATE 40 MG/ML IJ SOLN
INTRAMUSCULAR | Status: AC
  Administered 2016-01-18: 80 mg via INTRAMUSCULAR
  Filled 2016-01-18: qty 4

## 2016-01-18 MED ORDER — GENTAMICIN SULFATE 40 MG/ML IJ SOLN
160.0000 mg | Freq: Once | INTRAMUSCULAR | Status: AC
  Administered 2016-01-18: 80 mg via INTRAMUSCULAR

## 2016-01-18 NOTE — Discharge Instructions (Signed)
Transrectal Ultrasound-Guided Biopsy °A transrectal ultrasound-guided biopsy is a procedure to take samples of tissue from your prostate. Ultrasound images are used to guide the procedure. It is usually done to check the prostate gland for cancer. °BEFORE THE PROCEDURE °· Do not eat or drink after midnight on the night before your procedure. °· Take medicines as your doctor tells you. °· Your doctor may have you stop taking some medicines 5-7 days before the procedure. °· You will be given an enema before your procedure. During an enema, a liquid is put into your butt (rectum) to clear out waste. °· You may have lab tests the day of your procedure. °· Make plans to have someone drive you home. °PROCEDURE °· You will be given medicine to help you relax before the procedure. An IV tube will be put into one of your veins. It will be used to give fluids and medicine. °· You will be given medicine to reduce the risk of infection (antibiotic). °· You will be placed on your side. °· A probe with gel will be put in your butt. This is used to take pictures of your prostate and the area around it. °· A medicine to numb the area is put into your prostate. °· A biopsy needle is then inserted and guided to your prostate. °· Samples of prostate tissue are taken. The needle is removed. °· The samples are sent to a lab to be checked. Results are usually back in 2-3 days. °AFTER THE PROCEDURE °· You will be taken to a room where you will be watched until you are doing okay. °· You may have some pain in the area around your butt. You will be given medicines for this. °· You may be able to go home the same day. Sometimes, an overnight stay in the hospital is needed. °  °This information is not intended to replace advice given to you by your health care provider. Make sure you discuss any questions you have with your health care provider. °  °Document Released: 05/10/2009 Document Revised: 05/27/2013 Document Reviewed:  01/08/2013 °Elsevier Interactive Patient Education ©2016 [employer] ° °

## 2016-01-25 ENCOUNTER — Telehealth: Payer: Self-pay | Admitting: *Deleted

## 2016-01-25 MED ORDER — OXYCODONE-ACETAMINOPHEN 5-325 MG PO TABS: 1.0000 | ORAL_TABLET | Freq: Four times a day (QID) | ORAL | 0 refills | Status: DC | PRN

## 2016-01-25 NOTE — Telephone Encounter (Signed)
okay

## 2016-01-25 NOTE — Telephone Encounter (Signed)
Received call from patient.   Requested refill on Percocet.   Ok to refill??  Last office visit 11/05/2015.  Last refill 12/27/2015.

## 2016-01-25 NOTE — Telephone Encounter (Signed)
Prescription printed and patient made aware to come to office to pick up after 2 pm on 01/25/2016.

## 2016-01-26 DIAGNOSIS — G4733 Obstructive sleep apnea (adult) (pediatric): Secondary | ICD-10-CM | POA: Diagnosis not present

## 2016-01-27 ENCOUNTER — Other Ambulatory Visit: Payer: Self-pay | Admitting: "Endocrinology

## 2016-01-27 DIAGNOSIS — G4733 Obstructive sleep apnea (adult) (pediatric): Secondary | ICD-10-CM | POA: Diagnosis not present

## 2016-02-01 ENCOUNTER — Ambulatory Visit (INDEPENDENT_AMBULATORY_CARE_PROVIDER_SITE_OTHER): Payer: Medicare Other | Admitting: Urology

## 2016-02-01 ENCOUNTER — Other Ambulatory Visit: Payer: Self-pay | Admitting: Urology

## 2016-02-01 DIAGNOSIS — C61 Malignant neoplasm of prostate: Secondary | ICD-10-CM

## 2016-02-04 ENCOUNTER — Other Ambulatory Visit: Payer: Self-pay

## 2016-02-04 NOTE — Telephone Encounter (Signed)
Medication filled.  

## 2016-02-04 NOTE — Patient Outreach (Signed)
[facility]) Care Management  [place]  02/04/2016   Peter Dunlap 1955-06-15 [id number]  Subjective: Telephone call to patient for monthly call.  He reports he has been better.  Patient reports that his biopsy showed prostate cancer.  He says he has bone biopsy on Tuesday.  Patient states he is optimistic about diagnosis and doctor is too. He states he will probably have 8 weeks of radiation and go from there.  Patient still has problems with gout and is taking his medication. He uses the rollator frequently for support.  Patient reports he is to see his cardiologist sometime this month as well for a check up.  Patient reports sugars remain in the 90's.  Discussed continuing current regimen to keep sugars under control. He verbalized understanding.    Objective:   Encounter Medications:  Outpatient Encounter Prescriptions as of 02/04/2016  Medication Sig  . allopurinol (ZYLOPRIM) 100 MG tablet TAKE 1 TABLET BY MOUTH EVERY DAY  . aspirin EC 325 MG EC tablet Take 1 tablet (325 mg total) by mouth daily.  . clopidogrel (PLAVIX) 75 MG tablet Take 1 tablet (75 mg total) by mouth daily.  [name] DEXILANT 60 MG capsule TAKE 1 CAPSULE BY MOUTH EVERY DAY  . furosemide (LASIX) 20 MG tablet TAKE 3 TABLETS BY MOUTH DAILY  . isosorbide mononitrate (IMDUR) 60 MG 24 hr tablet TAKE 1 TABLET BY MOUTH EVERY DAY  . levothyroxine (SYNTHROID, LEVOTHROID) 175 MCG tablet TAKE 1 TABLET(175 MCG) BY MOUTH DAILY BEFORE BREAKFAST  . lidocaine (XYLOCAINE) 5 % ointment Apply 1 application topically as directed.  . Magnesium Oxide 400 MG CAPS Take 1 capsule (400 mg total) by mouth 2 (two) times daily.  . metFORMIN (GLUCOPHAGE) 500 MG tablet TAKE 1 TABLET(500 MG) BY MOUTH TWICE DAILY WITH A MEAL  . metoprolol tartrate (LOPRESSOR) 25 MG tablet TAKE 1 TABLET(25 MG) BY MOUTH TWICE DAILY  . nitroGLYCERIN (NITROSTAT) 0.4 MG SL tablet Place 0.4 mg under the tongue every 5 (five) minutes as needed for chest  pain (x 3 doses).  . ONE TOUCH ULTRA TEST test strip USE TO TEST ONCE DAILY  . oxyCODONE-acetaminophen (ROXICET) 5-325 MG tablet Take 1 tablet by mouth every 6 (six) hours as needed for severe pain.  . potassium chloride SA (K-DUR,KLOR-CON) 20 MEQ tablet TAKE 1 TABLET BY MOUTH EVERY DAY  . RAPAFLO 8 MG CAPS capsule TAKE 1 CAPSULE(8 MG) BY MOUTH DAILY  . simvastatin (ZOCOR) 40 MG tablet TAKE 1 TABLET(40 MG) BY MOUTH DAILY  . sotalol (BETAPACE) 80 MG tablet Take 1 tablet (80 mg total) by mouth 2 (two) times daily.  . traZODone (DESYREL) 50 MG tablet TAKE 1/2 TO 1 TABLET BY MOUTH AT BEDTIME AS NEEDED FOR SLEEP  . colchicine 0.6 MG tablet Take 1 tablet (0.6 mg total) by mouth daily. (Patient not taking: Reported on 02/04/2016)  . colchicine 0.6 MG tablet Take 1 tablet (0.6 mg total) by mouth daily. (Patient not taking: Reported on 01/04/2016)   Facility-Administered Encounter Medications as of 02/04/2016  Medication  . 0.9 %  sodium chloride infusion    Functional Status:  In your present state of health, do you have any difficulty performing the following activities: 08/05/2015 08/05/2015  Hearing? - N  Vision? - N  Difficulty concentrating or making decisions? - N  Walking or climbing stairs? - N  Dressing or bathing? - N  Doing errands, shopping? N -  Preparing Food and eating ? - -  Using  the Toilet? - -  In the past six months, have you accidently leaked urine? - -  Do you have problems with loss of bowel control? - -  Managing your Medications? - -  Managing your Finances? - -  Housekeeping or managing your Housekeeping? - -  Some recent data might be hidden    Fall/Depression Screening: PHQ 2/9 Scores 02/04/2016 01/04/2016 12/09/2015 11/11/2015 11/05/2015 11/05/2015 10/25/2015  PHQ - 2 Score 0 0 0 0 0 0 0  PHQ- 9 Score - - - - - 0 -    Assessment: Patient continues to benefit from health coach outreach for disease management and support.    Plan:  [facility] CM Care Plan Problem One   Flowsheet Row  Most Recent Value  Care Plan Problem One  Diabetes Knowledge deficit  Role Documenting the Problem One  Health Coach  Care Plan for Problem One  Active  THN Long Term Goal (31-90 days)  Patient will verbalize care for diabetes such as diet  and blood sugar monitoring within the next 90 days.  THN Long Term Goal Start Date  01/04/16  Interventions for Problem One [place] reiterated with patient foods high in carbohydrates and reasons to monitor blood sugar regularly.      RN Health Coach will contact patient in the month of October and patient agrees to next outreach.  [name], RN, MSN [place] [phone]

## 2016-02-08 ENCOUNTER — Encounter (HOSPITAL_COMMUNITY)
Admission: RE | Admit: 2016-02-08 | Discharge: 2016-02-08 | Disposition: A | Discharge status: Home / Self Care | Payer: Medicare Other | Source: Ambulatory Visit | Attending: Urology | Admitting: Urology

## 2016-02-08 ENCOUNTER — Encounter (HOSPITAL_COMMUNITY): Payer: Self-pay

## 2016-02-08 ENCOUNTER — Other Ambulatory Visit: Payer: Self-pay | Admitting: Urology

## 2016-02-08 DIAGNOSIS — C61 Malignant neoplasm of prostate: Secondary | ICD-10-CM | POA: Diagnosis not present

## 2016-02-08 MED ORDER — TECHNETIUM TC 99M MEDRONATE IV KIT
20.0000 | PACK | Freq: Once | INTRAVENOUS | Status: AC | PRN
  Administered 2016-02-08: 19.7 via INTRAVENOUS

## 2016-02-10 ENCOUNTER — Ambulatory Visit: Payer: Medicare Other | Admitting: "Endocrinology

## 2016-02-14 NOTE — Progress Notes (Signed)
Watchman Office Visit    Date:  02/16/2016   ID:  Peter Dunlap, DOB [birth year], MRN [id number]  PCP:  [name], MD  Primary Electrophysiologist: Lovena Le  CC: follow up AF and Watchman   History of Present Illness: Peter Dunlap is a 60 y.o. male who presents today for AF and Watchman follow up. Since last being seen in clinic, he reports doing reasonably well.  He still struggles with right knee pain limiting his mobility. He has also recently been diagnosed with prostate cancer and has been told he isn't a candidate for surgery.  He is exploring chemo and radiation.   He denies symptoms of shortness of breath, orthopnea, PND, lower extremity edema, claudication, dizziness, presyncope, syncope, bleeding, or neurologic sequela.  The patient is tolerating medications without difficulties and is otherwise without complaint today.   Past Medical History:  Diagnosis Date  . Arteriosclerotic cardiovascular disease (ASCVD) 2005   catheterization in 10/2010:50% mid LAD, diffuse distal disease, circumflex irregularities, large dominant RCA with a 50% ostial, 70% distal, 60% posterolateral and 70% PDA; normal EF  . Arthritis   . Benign prostatic hypertrophy   . Cancer ([place])   . Cerebrovascular disease 2010   R. carotid endarterectomy; Duplex in 10/2010-widely patent ICAs, subtotal left vertebral-not thought to be contributing to symptoms  . Cervical spine disease    CT in 2012-advanced degeneration and spondylosis with moderate spinal stenosis at C3-C6  . Depression   . Erectile dysfunction   . Gastroesophageal reflux disease   . H/O hiatal hernia   . H/O: substance abuse    Cocaine, marijuana, alcohol.  Quit 2013.   [name] Hyperlipidemia   . Hypertension   . Non-ST elevation myocardial infarction (NSTEMI), initial episode of care [facility]) 12/02/2013   DES LAD  . Obesity   . Sleep apnea    CPAP  . Tachy-brady syndrome ([place])    a. s/p STJ dual chamber PPM   . Thyroid disease   .  Tobacco abuse    Quit 2014   Past Surgical History:  Procedure Laterality Date  . BRAIN SURGERY  2015   hematoma evacuation  . BURR HOLE Right 04/13/2014   Procedure: Haskell Flirt;  Surgeon: Charlie Pitter, MD;  Location: [place] NEURO ORS;  Service: Neurosurgery;  Laterality: Right;  . CAROTID ENDARTERECTOMY Right Feb. 25, 2010    CEA  . CORONARY ANGIOPLASTY WITH STENT PLACEMENT  12/03/2013   LAD 90%-->0% W/ Promus Premier DES 3.0 mm x 16 mm, CFX OK, RCA 40%, EF 70-75%  . LEFT ATRIAL APPENDAGE OCCLUSION N/A 08/05/2015   Procedure: LEFT ATRIAL APPENDAGE OCCLUSION;  Surgeon: [name], MD;  Location: [place] CV LAB;  Service: Cardiovascular;  Laterality: N/A;  . LEFT HEART CATHETERIZATION WITH CORONARY ANGIOGRAM Left 12/03/2013   Procedure: LEFT HEART CATHETERIZATION WITH CORONARY ANGIOGRAM;  Surgeon: Leonie Man, MD;  Location: [facility] CATH LAB;  Service: Cardiovascular;  Laterality: Left;  . LEFT HEART CATHETERIZATION WITH CORONARY ANGIOGRAM N/A 01/26/2014   Procedure: LEFT HEART CATHETERIZATION WITH CORONARY ANGIOGRAM;  Surgeon: Jettie Booze, MD;  Location: [facility] CATH LAB;  Service: Cardiovascular;  Laterality: N/A;  . LEFT HEART CATHETERIZATION WITH CORONARY ANGIOGRAM N/A 08/03/2014   Procedure: LEFT HEART CATHETERIZATION WITH CORONARY ANGIOGRAM;  Surgeon: Burnell Blanks, MD;  Location: [facility] CATH LAB;  Service: Cardiovascular;  Laterality: N/A;  . PERCUTANEOUS CORONARY STENT INTERVENTION (PCI-S)  12/03/2013   Procedure: PERCUTANEOUS CORONARY STENT INTERVENTION (PCI-S);  Surgeon: Leonie Man, MD;  Location: [place] CATH LAB;  Service: Cardiovascular;;  . PERMANENT PACEMAKER INSERTION N/A 09/18/2014   Procedure: PERMANENT PACEMAKER INSERTION;  Surgeon: [name], MD;  Location: [facility] CATH LAB;  Service: Cardiovascular;  Laterality: N/A;  . RADIOFREQUENCY ABLATION  2005   for PSVT  . TEE WITHOUT CARDIOVERSION N/A 07/27/2015   Procedure: TRANSESOPHAGEAL ECHOCARDIOGRAM (TEE);  Surgeon: Lelon Perla, MD;  Location: [facility] ENDOSCOPY;  Service: Cardiovascular;  Laterality: N/A;  . TEE WITHOUT CARDIOVERSION N/A 09/15/2015   Procedure: TRANSESOPHAGEAL ECHOCARDIOGRAM (TEE);  Surgeon: Thayer Headings, MD;  Location: [facility] ENDOSCOPY;  Service: Cardiovascular;  Laterality: N/A;     Current Outpatient Prescriptions  Medication Sig Dispense Refill  . allopurinol (ZYLOPRIM) 100 MG tablet TAKE 1 TABLET BY MOUTH EVERY DAY 90 tablet 6  . aspirin EC 325 MG EC tablet Take 1 tablet (325 mg total) by mouth daily.    . colchicine 0.6 MG tablet Take 1 tablet (0.6 mg total) by mouth daily. 15 tablet 0  . DEXILANT 60 MG capsule TAKE 1 CAPSULE BY MOUTH EVERY DAY 30 capsule 3  . furosemide (LASIX) 20 MG tablet TAKE 3 TABLETS BY MOUTH DAILY 90 tablet 6  . isosorbide mononitrate (IMDUR) 60 MG 24 hr tablet TAKE 1 TABLET BY MOUTH EVERY DAY 90 tablet 2  . levothyroxine (SYNTHROID, LEVOTHROID) 175 MCG tablet TAKE 1 TABLET(175 MCG) BY MOUTH DAILY BEFORE BREAKFAST 30 tablet 2  . lidocaine (XYLOCAINE) 5 % ointment Apply 1 application topically as directed.    . Magnesium Oxide 400 MG CAPS Take 1 capsule (400 mg total) by mouth 2 (two) times daily. 60 capsule 11  . metFORMIN (GLUCOPHAGE) 500 MG tablet TAKE 1 TABLET(500 MG) BY MOUTH TWICE DAILY WITH A MEAL 60 tablet 2  . metoprolol tartrate (LOPRESSOR) 25 MG tablet TAKE 1 TABLET(25 MG) BY MOUTH TWICE DAILY 180 tablet 3  . nitroGLYCERIN (NITROSTAT) 0.4 MG SL tablet Place 0.4 mg under the tongue every 5 (five) minutes as needed for chest pain (x 3 doses).    . ONE TOUCH ULTRA TEST test strip USE TO TEST ONCE DAILY 50 each 11  . oxyCODONE-acetaminophen (ROXICET) 5-325 MG tablet Take 1 tablet by mouth every 6 (six) hours as needed for severe pain. 30 tablet 0  . potassium chloride SA (K-DUR,KLOR-CON) 20 MEQ tablet TAKE 1 TABLET BY MOUTH EVERY DAY 30 tablet 6  . RAPAFLO 8 MG CAPS capsule TAKE 1 CAPSULE(8 MG) BY MOUTH DAILY 30 capsule 6  . simvastatin (ZOCOR) 40 MG tablet TAKE  1 TABLET(40 MG) BY MOUTH DAILY 30 tablet 2  . sotalol (BETAPACE) 120 MG tablet Take 1 tablet (120 mg total) by mouth 2 (two) times daily. 60 tablet 5  . traZODone (DESYREL) 50 MG tablet TAKE 1/2 TO 1 TABLET BY MOUTH AT BEDTIME AS NEEDED FOR SLEEP 30 tablet 3   No current facility-administered medications for this visit.    Facility-Administered Medications Ordered in Other Visits  Medication Dose Route Frequency Provider Last Rate Last Dose  . 0.9 %  sodium chloride infusion   Intravenous Continuous [name], MD        Allergies:   Lactose intolerance (gi)   Social History:  The patient  reports that he quit smoking about 3 years ago. His smoking use included Cigarettes. He started smoking about 43 years ago. He has a 40.00 pack-year smoking history. He has never used smokeless tobacco. He reports that he does not drink alcohol or use drugs.  Family History:  The patient's family history includes Coronary artery disease (age of onset: 54) in his father; Diabetes in his brother, mother, and sister; Diabetes type II in his father; Heart attack in his father; Heart attack (age of onset: 44) in his sister; Hypertension in his brother, father, mother, and sister; Lung cancer in his paternal uncle.    ROS:  Please see the history of present illness.   All other systems are reviewed and negative.    PHYSICAL EXAM: VS:  BP 124/68   Pulse 78   Ht 5\' 11"  (1.803 m)   Wt (!) 316 lb 9.6 oz (143.6 kg)   SpO2 99%   BMI 44.16 kg/m  , BMI Body mass index is 44.16 kg/m. GEN: Obese, well developed, in no acute distress  HEENT: normal  Neck: no JVD, carotid bruits, or masses Cardiac: RRR; 2/6 SEM Respiratory:  clear to auscultation bilaterally, normal work of breathing GI: obese, nontender, nondistended, + BS MS: no deformity or atrophy  Skin: warm and dry  Neuro:  Strength and sensation are intact Psych: euthymic mood, full affect  EKG:  EKG is ordered today. EKG today shows AF with  intermittent SR, QTc 416msec  Recent Labs: 08/18/2015: Magnesium 1.6 10/12/2015: TSH 1.78 11/05/2015: ALT 15; BUN 13; Creat 1.34; Hemoglobin 16.9; Platelets 245; Potassium 3.9; Sodium 137    Lipid Panel     Component Value Date/Time   CHOL 151 11/05/2015 0848   TRIG 199 (H) 11/05/2015 0848   HDL 35 (L) 11/05/2015 0848   CHOLHDL 4.3 11/05/2015 0848   VLDL 40 (H) 11/05/2015 0848   LDLCALC 76 11/05/2015 0848     Wt Readings from Last 3 Encounters:  02/16/16 (!) 316 lb 9.6 oz (143.6 kg)  12/01/15 (!) 311 lb 3.2 oz (141.2 kg)  11/25/15 (!) 316 lb 3.2 oz (143.4 kg)      ASSESSMENT AND PLAN:  1.  Paroxysmal atrial fibrillation Doing well post Watchman 6 week TEE showed good device seal Now 6 months post procedure and will discontinue Plavix. Continue ASA 325mg  indefinitely Burden by device interrogation today 22% Increase Sotalol to 120mg  bid for rhythm control  QTc stable BMET, Mg today  2.  Morbid obesity Weight loss advised  3.  HTN Stable No change required today  4.  CAD No recent ischemic symptoms  5.  OSA Compliance with CPAP advised   6. Tachy/brady syndrome Normal pacemaker function See Pace Art report  Follow-up with [facility], Dr Lovena Le 3 months for Sotalol   Current medicines are reviewed at length with the patient today.   The patient does not have concerns regarding his medicines.  The following changes were made today:  Increase Sotalol to 120mg  bid    Signed, Chanetta Marshall, NP  02/16/2016 11:56 AM     [facility] [address] [place] [address] [phone] (office) [phone] (fax)

## 2016-02-16 ENCOUNTER — Other Ambulatory Visit: Payer: Self-pay | Admitting: "Endocrinology

## 2016-02-16 ENCOUNTER — Ambulatory Visit (INDEPENDENT_AMBULATORY_CARE_PROVIDER_SITE_OTHER): Payer: Medicare Other | Admitting: Nurse Practitioner

## 2016-02-16 ENCOUNTER — Encounter: Payer: Self-pay | Admitting: Nurse Practitioner

## 2016-02-16 VITALS — BP 124/68 | HR 78 | Ht 71.0 in | Wt 316.6 lb

## 2016-02-16 DIAGNOSIS — I10 Essential (primary) hypertension: Secondary | ICD-10-CM | POA: Diagnosis not present

## 2016-02-16 DIAGNOSIS — I480 Paroxysmal atrial fibrillation: Secondary | ICD-10-CM | POA: Diagnosis not present

## 2016-02-16 DIAGNOSIS — I495 Sick sinus syndrome: Secondary | ICD-10-CM

## 2016-02-16 LAB — CUP PACEART INCLINIC DEVICE CHECK
Date Time Interrogation Session: 20170913115350
Implantable Lead Implant Date: 20160415
Implantable Lead Implant Date: 20160415
Implantable Lead Location: 753859
Implantable Lead Location: 753860
Pulse Gen Model: 2240
Pulse Gen Serial Number: 7756161

## 2016-02-16 LAB — BASIC METABOLIC PANEL
BUN: 11 mg/dL (ref 7–25)
CO2: 22 mmol/L (ref 20–31)
Calcium: 9.6 mg/dL (ref 8.6–10.3)
Chloride: 102 mmol/L (ref 98–110)
Creat: 1.18 mg/dL (ref 0.70–1.25)
Glucose, Bld: 78 mg/dL (ref 65–99)
Potassium: 4.2 mmol/L (ref 3.5–5.3)
Sodium: 137 mmol/L (ref 135–146)

## 2016-02-16 LAB — MAGNESIUM: Magnesium: 1.9 mg/dL (ref 1.5–2.5)

## 2016-02-16 MED ORDER — SOTALOL HCL 120 MG PO TABS: 120.0000 mg | ORAL_TABLET | Freq: Two times a day (BID) | ORAL | 5 refills | Status: DC

## 2016-02-16 NOTE — Patient Instructions (Addendum)
Medication Instructions:   STOP TAKING PLAVIX   START TAKING SOTALOL 120 MG TWICE A DAY   If you need a refill on your cardiac medications before your next appointment, please call your pharmacy.  Labwork: BMET MAG    Testing/Procedures: NONE ORDER TODAY    Follow-Up: 3 MONTHS WITH DR Lovena Le    Any Other Special Instructions Will Be Listed Below (If Applicable).

## 2016-02-17 ENCOUNTER — Ambulatory Visit (HOSPITAL_COMMUNITY)
Admission: RE | Admit: 2016-02-17 | Discharge: 2016-02-17 | Disposition: A | Discharge status: Home / Self Care | Payer: Medicare Other | Source: Ambulatory Visit | Attending: Urology | Admitting: Urology

## 2016-02-17 ENCOUNTER — Encounter (HOSPITAL_COMMUNITY): Payer: Self-pay

## 2016-02-17 ENCOUNTER — Telehealth: Payer: Self-pay | Admitting: *Deleted

## 2016-02-17 DIAGNOSIS — K760 Fatty (change of) liver, not elsewhere classified: Secondary | ICD-10-CM | POA: Diagnosis not present

## 2016-02-17 DIAGNOSIS — C61 Malignant neoplasm of prostate: Secondary | ICD-10-CM | POA: Insufficient documentation

## 2016-02-17 MED ORDER — IOPAMIDOL (ISOVUE-300) INJECTION 61%
100.0000 mL | Freq: Once | INTRAVENOUS | Status: AC | PRN
  Administered 2016-02-17: 100 mL via INTRAVENOUS

## 2016-02-17 NOTE — Telephone Encounter (Signed)
-----   Message from Peter Berthold, NP sent at 02/17/2016  7:19 AM EDT ----- Please notify patient of stable labs

## 2016-02-17 NOTE — Telephone Encounter (Signed)
SPOKE TO WIFE ABOUT RESULTS AND VERBALIZED UNDERSTANDING

## 2016-02-22 ENCOUNTER — Other Ambulatory Visit: Payer: Self-pay | Admitting: "Endocrinology

## 2016-02-22 DIAGNOSIS — E119 Type 2 diabetes mellitus without complications: Secondary | ICD-10-CM | POA: Diagnosis not present

## 2016-02-22 DIAGNOSIS — E785 Hyperlipidemia, unspecified: Secondary | ICD-10-CM | POA: Diagnosis not present

## 2016-02-22 DIAGNOSIS — E032 Hypothyroidism due to medicaments and other exogenous substances: Secondary | ICD-10-CM | POA: Diagnosis not present

## 2016-02-22 LAB — LIPID PANEL
Cholesterol: 130 mg/dL (ref 125–200)
HDL: 36 mg/dL — ABNORMAL LOW (ref >=40)
LDL Cholesterol: 70 mg/dL (ref <130)
Total CHOL/HDL Ratio: 3.6 Ratio (ref <=5.0)
Triglycerides: 122 mg/dL (ref <150)
VLDL: 24 mg/dL (ref <30)

## 2016-02-22 LAB — COMPLETE METABOLIC PANEL WITH GFR
ALT: 11 U/L (ref 9–46)
AST: 18 U/L (ref 10–35)
Albumin: 3.8 g/dL (ref 3.6–5.1)
Alkaline Phosphatase: 72 U/L (ref 40–115)
BUN: 13 mg/dL (ref 7–25)
CO2: 26 mmol/L (ref 20–31)
Calcium: 9.2 mg/dL (ref 8.6–10.3)
Chloride: 103 mmol/L (ref 98–110)
Creat: 1.26 mg/dL — ABNORMAL HIGH (ref 0.70–1.25)
GFR, Est African American: 71 mL/min (ref >=60)
GFR, Est Non African American: 62 mL/min (ref >=60)
Glucose, Bld: 111 mg/dL — ABNORMAL HIGH (ref 65–99)
Potassium: 4.1 mmol/L (ref 3.5–5.3)
Sodium: 139 mmol/L (ref 135–146)
Total Bilirubin: 0.9 mg/dL (ref 0.2–1.2)
Total Protein: 7.3 g/dL (ref 6.1–8.1)

## 2016-02-22 LAB — TSH: TSH: 12 mIU/L — ABNORMAL HIGH (ref 0.40–4.50)

## 2016-02-22 LAB — T4, FREE: Free T4: 1.1 ng/dL (ref 0.8–1.8)

## 2016-02-23 LAB — HEMOGLOBIN A1C
Hgb A1c MFr Bld: 5.9 % — ABNORMAL HIGH (ref <5.7)
Mean Plasma Glucose: 123 mg/dL

## 2016-02-24 ENCOUNTER — Other Ambulatory Visit: Payer: Self-pay | Admitting: *Deleted

## 2016-02-24 NOTE — Telephone Encounter (Signed)
Received call from patient.   Requested refill on Oxycodone/APAP.  Ok to refill??  Last office visit 11/05/2015.  Last refill 01/25/2016.

## 2016-02-25 MED ORDER — OXYCODONE-ACETAMINOPHEN 5-325 MG PO TABS: 1.0000 | ORAL_TABLET | Freq: Four times a day (QID) | ORAL | 0 refills | Status: DC | PRN

## 2016-02-25 NOTE — Telephone Encounter (Signed)
okay

## 2016-02-25 NOTE — Telephone Encounter (Signed)
Prescription printed and patient made aware to come to office to pick up after 2pm o n 02/25/2016. 

## 2016-02-29 ENCOUNTER — Ambulatory Visit (INDEPENDENT_AMBULATORY_CARE_PROVIDER_SITE_OTHER): Payer: Medicare Other | Admitting: "Endocrinology

## 2016-02-29 ENCOUNTER — Encounter: Payer: Self-pay | Admitting: "Endocrinology

## 2016-02-29 VITALS — BP 134/81 | HR 78 | Ht 71.0 in | Wt 313.0 lb

## 2016-02-29 DIAGNOSIS — E890 Postprocedural hypothyroidism: Secondary | ICD-10-CM | POA: Diagnosis not present

## 2016-02-29 DIAGNOSIS — E119 Type 2 diabetes mellitus without complications: Secondary | ICD-10-CM | POA: Diagnosis not present

## 2016-02-29 DIAGNOSIS — E669 Obesity, unspecified: Secondary | ICD-10-CM

## 2016-02-29 DIAGNOSIS — E1169 Type 2 diabetes mellitus with other specified complication: Secondary | ICD-10-CM

## 2016-02-29 IMAGING — CR DG CHEST 2V
2 series · 2 of 2 positions shown · non-contrast
Comparison: 07/31/2014

CLINICAL DATA: Status post pacemaker placement

EXAM:
CHEST  2 VIEW

[chest pa]
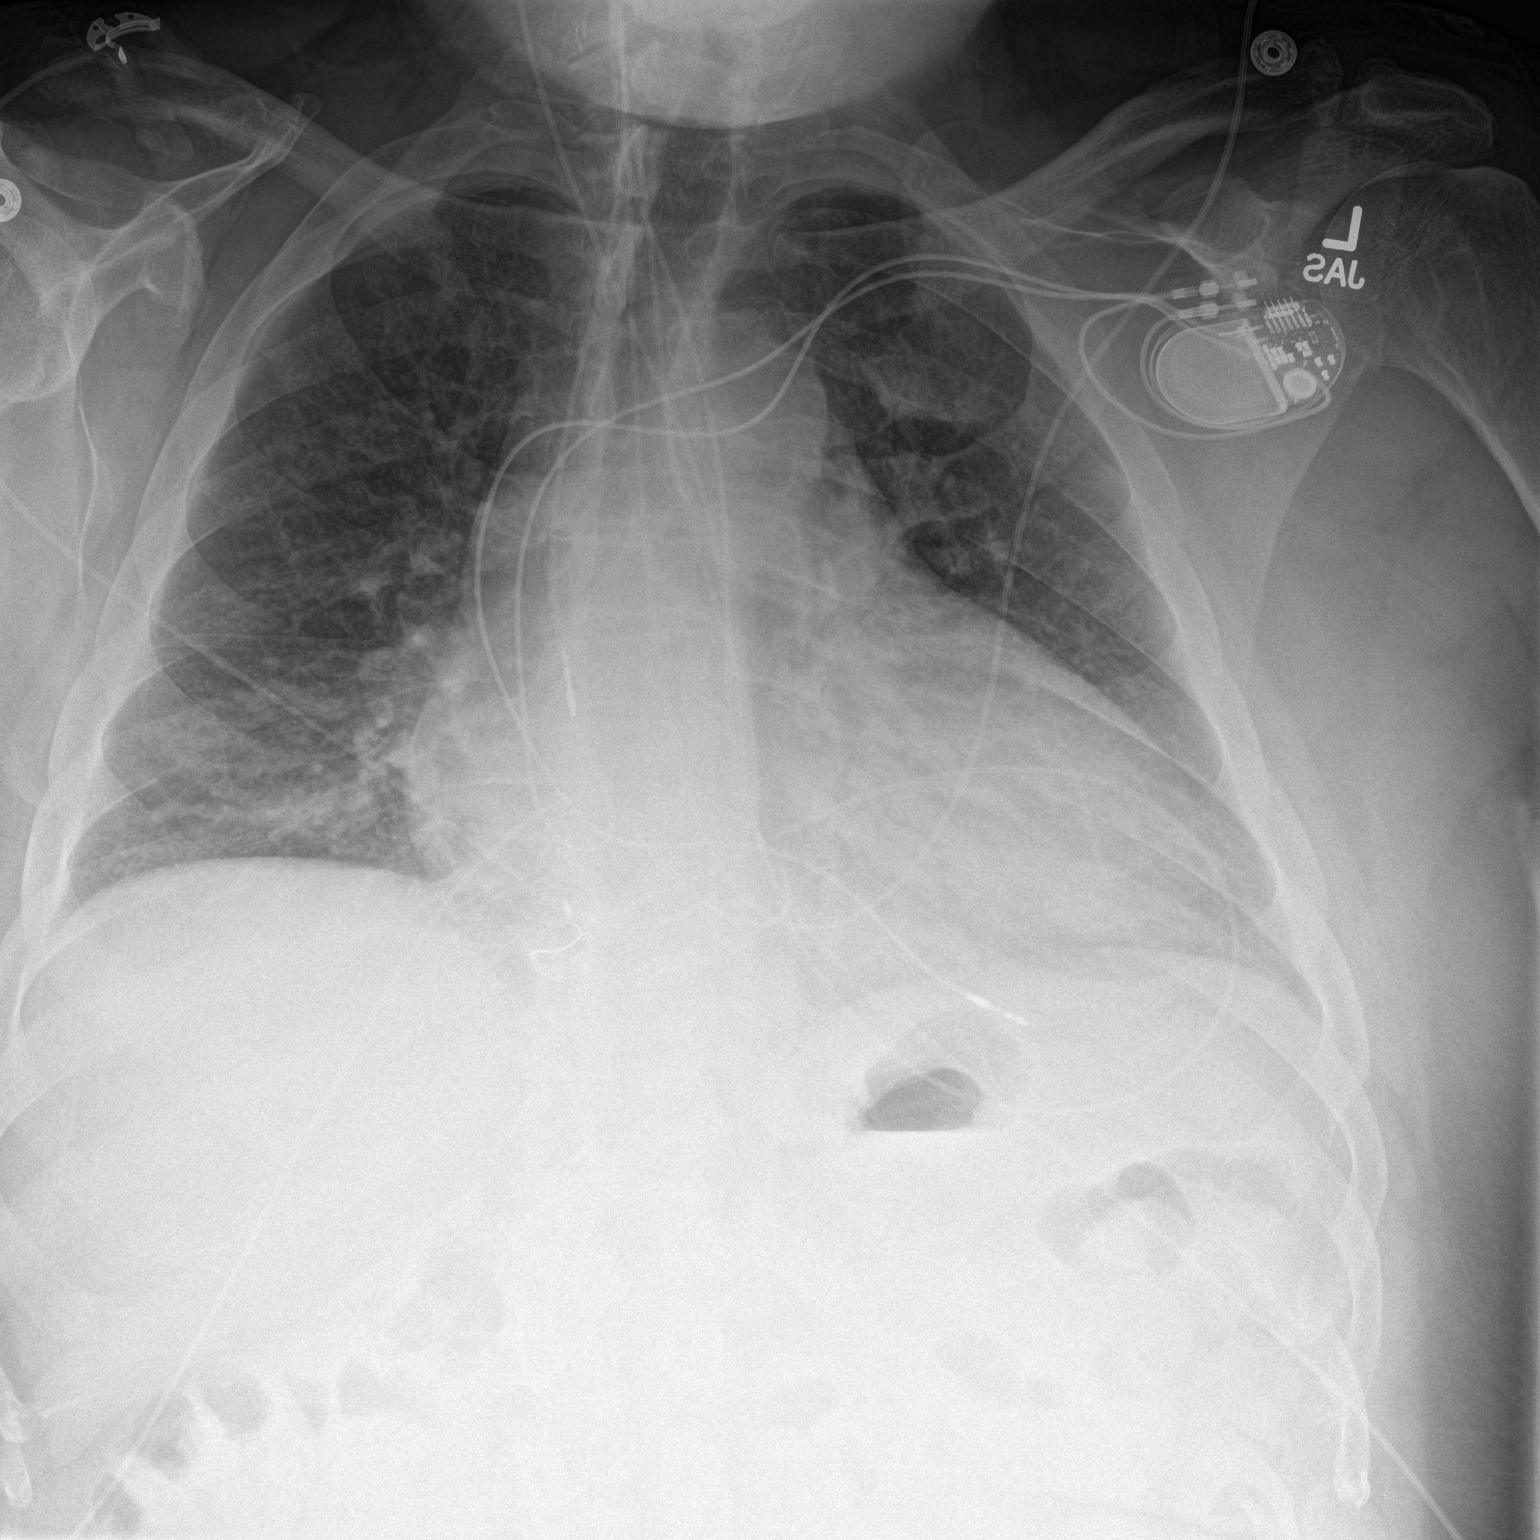

[chest lat]
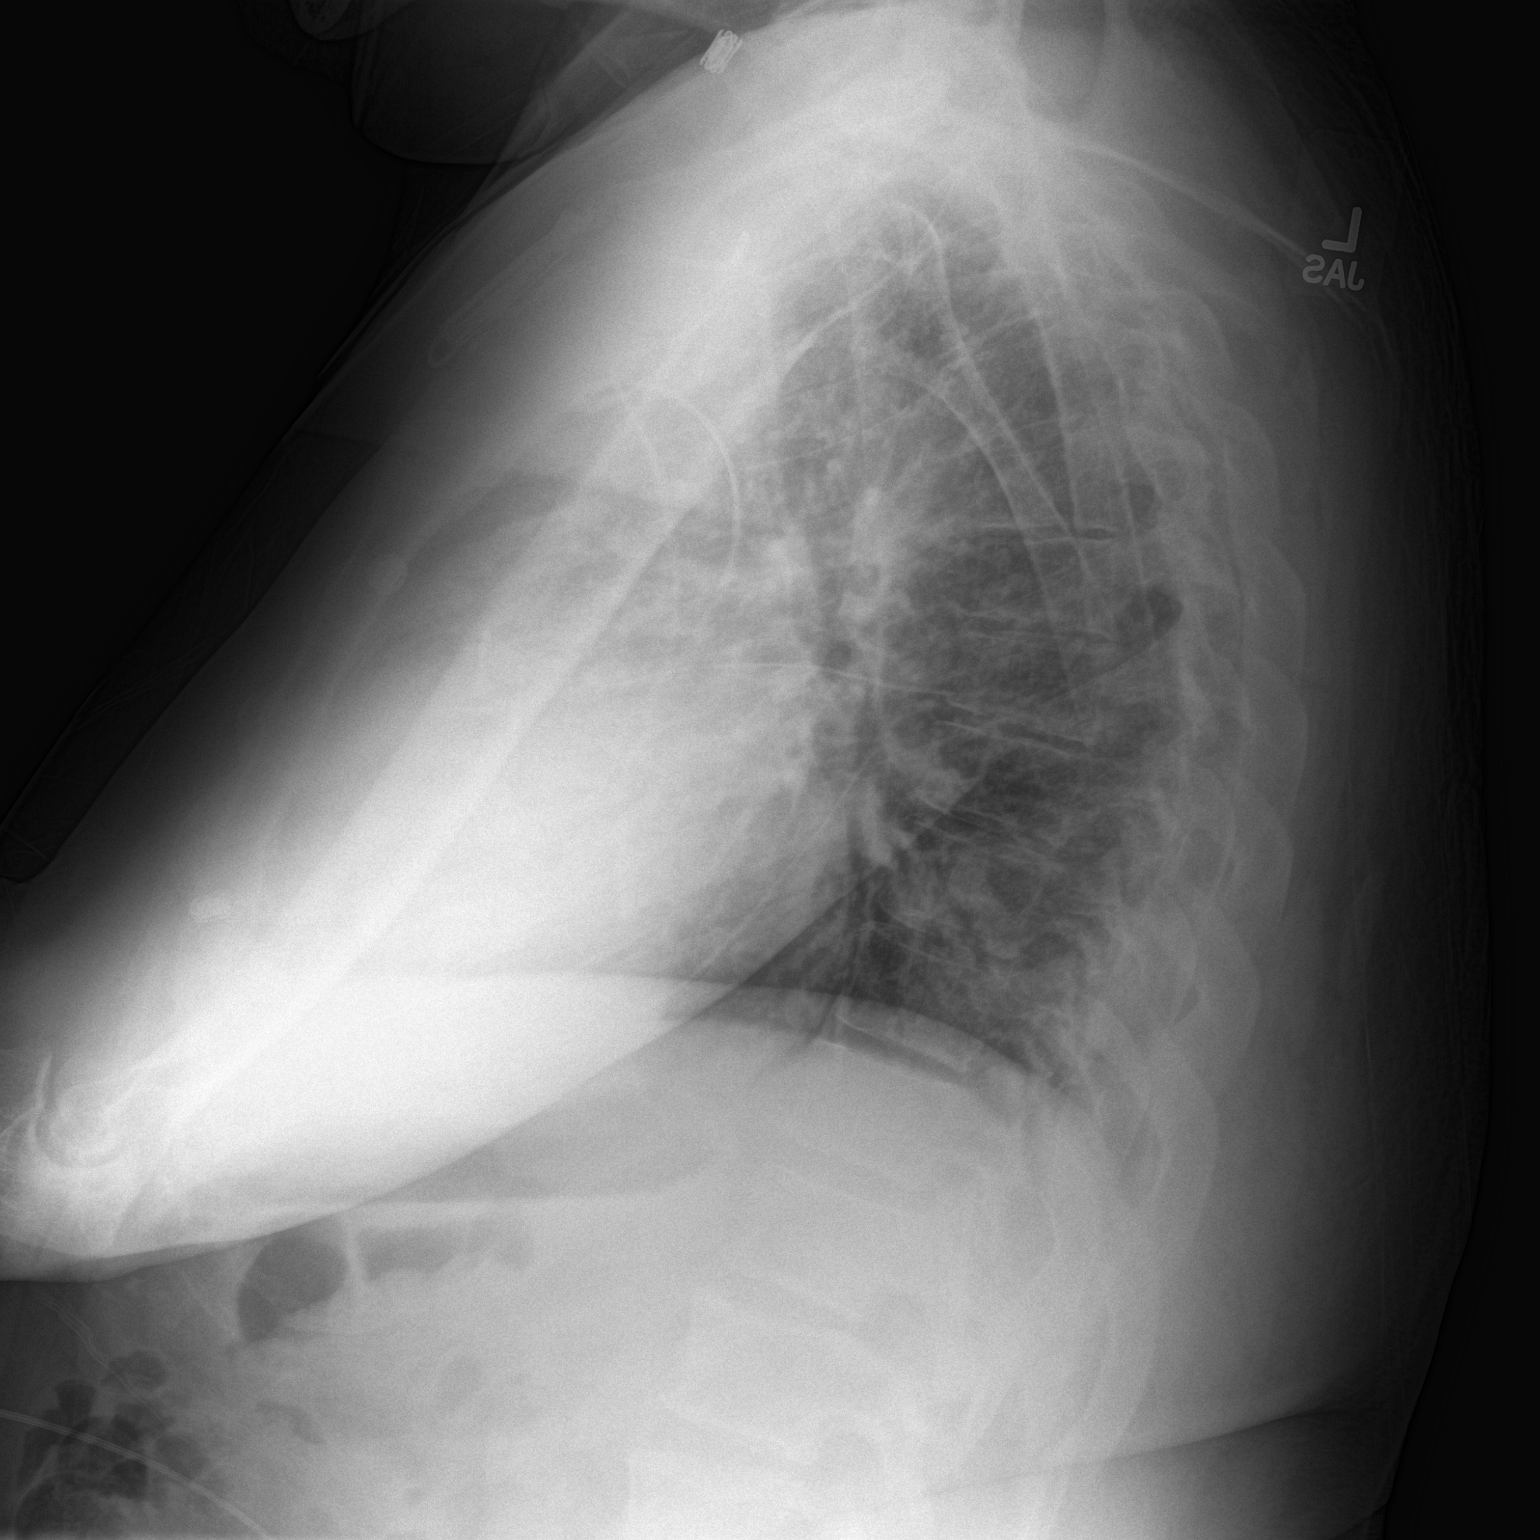

[2 of 2 positions shown; findings below may reference images not displayed]

FINDINGS: Cardiac shadow is enlarged. A pacing device is noted. No
pneumothorax is seen. No focal infiltrate or sizable effusion is
noted.
IMPRESSION: Status post pacemaker placement without pneumothorax.

## 2016-02-29 MED ORDER — LEVOTHYROXINE SODIUM 200 MCG PO TABS
200.0000 ug | ORAL_TABLET | Freq: Every day | ORAL | 6 refills | Status: DC
Start: 2016-02-29 — End: 2016-06-12

## 2016-02-29 NOTE — Progress Notes (Signed)
Subjective:    Patient ID: Peter Dunlap, male    DOB: 01-Oct-1955, PCP [name], MD   Past Medical History:  Diagnosis Date  . Arteriosclerotic cardiovascular disease (ASCVD) 2005   catheterization in 10/2010:50% mid LAD, diffuse distal disease, circumflex irregularities, large dominant RCA with a 50% ostial, 70% distal, 60% posterolateral and 70% PDA; normal EF  . Arthritis   . Benign prostatic hypertrophy   . Cancer ([place])   . Cerebrovascular disease 2010   R. carotid endarterectomy; Duplex in 10/2010-widely patent ICAs, subtotal left vertebral-not thought to be contributing to symptoms  . Cervical spine disease    CT in 2012-advanced degeneration and spondylosis with moderate spinal stenosis at C3-C6  . Depression   . Erectile dysfunction   . Gastroesophageal reflux disease   . H/O hiatal hernia   . H/O: substance abuse    Cocaine, marijuana, alcohol.  Quit 2013.   [name] Hyperlipidemia   . Hypertension   . Non-ST elevation myocardial infarction (NSTEMI), initial episode of care [facility]) 12/02/2013   DES LAD  . Obesity   . Sleep apnea    CPAP  . Tachy-brady syndrome ([place])    a. s/p STJ dual chamber PPM   . Thyroid disease   . Tobacco abuse    Quit 2014   Past Surgical History:  Procedure Laterality Date  . BRAIN SURGERY  2015   hematoma evacuation  . BURR HOLE Right 04/13/2014   Procedure: Haskell Flirt;  Surgeon: Charlie Pitter, MD;  Location: [place] NEURO ORS;  Service: Neurosurgery;  Laterality: Right;  . CAROTID ENDARTERECTOMY Right Feb. 25, 2010    CEA  . CORONARY ANGIOPLASTY WITH STENT PLACEMENT  12/03/2013   LAD 90%-->0% W/ Promus Premier DES 3.0 mm x 16 mm, CFX OK, RCA 40%, EF 70-75%  . LEFT ATRIAL APPENDAGE OCCLUSION N/A 08/05/2015   Procedure: LEFT ATRIAL APPENDAGE OCCLUSION;  Surgeon: [name], MD;  Location: [place] CV LAB;  Service: Cardiovascular;  Laterality: N/A;  . LEFT HEART CATHETERIZATION WITH CORONARY ANGIOGRAM Left 12/03/2013   Procedure: LEFT  HEART CATHETERIZATION WITH CORONARY ANGIOGRAM;  Surgeon: Leonie Man, MD;  Location: [facility] CATH LAB;  Service: Cardiovascular;  Laterality: Left;  . LEFT HEART CATHETERIZATION WITH CORONARY ANGIOGRAM N/A 01/26/2014   Procedure: LEFT HEART CATHETERIZATION WITH CORONARY ANGIOGRAM;  Surgeon: Jettie Booze, MD;  Location: [facility] CATH LAB;  Service: Cardiovascular;  Laterality: N/A;  . LEFT HEART CATHETERIZATION WITH CORONARY ANGIOGRAM N/A 08/03/2014   Procedure: LEFT HEART CATHETERIZATION WITH CORONARY ANGIOGRAM;  Surgeon: Burnell Blanks, MD;  Location: [facility] CATH LAB;  Service: Cardiovascular;  Laterality: N/A;  . PERCUTANEOUS CORONARY STENT INTERVENTION (PCI-S)  12/03/2013   Procedure: PERCUTANEOUS CORONARY STENT INTERVENTION (PCI-S);  Surgeon: Leonie Man, MD;  Location: [facility] CATH LAB;  Service: Cardiovascular;;  . PERMANENT PACEMAKER INSERTION N/A 09/18/2014   Procedure: PERMANENT PACEMAKER INSERTION;  Surgeon: [name], MD;  Location: [facility] CATH LAB;  Service: Cardiovascular;  Laterality: N/A;  . RADIOFREQUENCY ABLATION  2005   for PSVT  . TEE WITHOUT CARDIOVERSION N/A 07/27/2015   Procedure: TRANSESOPHAGEAL ECHOCARDIOGRAM (TEE);  Surgeon: Lelon Perla, MD;  Location: [facility] ENDOSCOPY;  Service: Cardiovascular;  Laterality: N/A;  . TEE WITHOUT CARDIOVERSION N/A 09/15/2015   Procedure: TRANSESOPHAGEAL ECHOCARDIOGRAM (TEE);  Surgeon: Thayer Headings, MD;  Location: [facility] ENDOSCOPY;  Service: Cardiovascular;  Laterality: N/A;   Social History   Social History  . Marital status: Married    Spouse name:  N/A  . Number of children: 0  . Years of education: N/A   Occupational History  . Retired    Social History Main Topics  . Smoking status: Former Smoker    Packs/day: 1.00    Years: 40.00    Types: Cigarettes    Start date: 10/20/1972    Quit date: 10/10/2012  . Smokeless tobacco: Never Used     Comment: Quit in May.   . Alcohol use No     Comment: former drinker-- sober since 2013.    . Drug use: No     Comment: quit cocaine 10/2011  . Sexual activity: Yes    Partners: Female   Other Topics Concern  . None   Social History Narrative   Lives in [place].   Outpatient Encounter Prescriptions as of 02/29/2016  Medication Sig  . allopurinol (ZYLOPRIM) 100 MG tablet TAKE 1 TABLET BY MOUTH EVERY DAY  . aspirin EC 325 MG EC tablet Take 1 tablet (325 mg total) by mouth daily.  . colchicine 0.6 MG tablet Take 1 tablet (0.6 mg total) by mouth daily.  [name] DEXILANT 60 MG capsule TAKE 1 CAPSULE BY MOUTH EVERY DAY  . furosemide (LASIX) 20 MG tablet TAKE 3 TABLETS BY MOUTH DAILY  . isosorbide mononitrate (IMDUR) 60 MG 24 hr tablet TAKE 1 TABLET BY MOUTH EVERY DAY  . levothyroxine (SYNTHROID, LEVOTHROID) 200 MCG tablet Take 1 tablet (200 mcg total) by mouth daily before breakfast.  . lidocaine (XYLOCAINE) 5 % ointment Apply 1 application topically as directed.  . Magnesium Oxide 400 MG CAPS Take 1 capsule (400 mg total) by mouth 2 (two) times daily.  . metFORMIN (GLUCOPHAGE) 500 MG tablet TAKE 1 TABLET(500 MG) BY MOUTH TWICE DAILY WITH A MEAL  . metoprolol tartrate (LOPRESSOR) 25 MG tablet TAKE 1 TABLET(25 MG) BY MOUTH TWICE DAILY  . nitroGLYCERIN (NITROSTAT) 0.4 MG SL tablet Place 0.4 mg under the tongue every 5 (five) minutes as needed for chest pain (x 3 doses).  . ONE TOUCH ULTRA TEST test strip USE TO TEST ONCE DAILY  . oxyCODONE-acetaminophen (ROXICET) 5-325 MG tablet Take 1 tablet by mouth every 6 (six) hours as needed for severe pain.  . potassium chloride SA (K-DUR,KLOR-CON) 20 MEQ tablet TAKE 1 TABLET BY MOUTH EVERY DAY  . RAPAFLO 8 MG CAPS capsule TAKE 1 CAPSULE(8 MG) BY MOUTH DAILY  . simvastatin (ZOCOR) 40 MG tablet TAKE 1 TABLET(40 MG) BY MOUTH DAILY  . sotalol (BETAPACE) 120 MG tablet Take 1 tablet (120 mg total) by mouth 2 (two) times daily.  . traZODone (DESYREL) 50 MG tablet TAKE 1/2 TO 1 TABLET BY MOUTH AT BEDTIME AS NEEDED FOR SLEEP  . [DISCONTINUED]  levothyroxine (SYNTHROID, LEVOTHROID) 175 MCG tablet TAKE 1 TABLET(175 MCG) BY MOUTH DAILY BEFORE BREAKFAST   Facility-Administered Encounter Medications as of 02/29/2016  Medication  . 0.9 %  sodium chloride infusion   ALLERGIES: Allergies  Allergen Reactions  . Lactose Intolerance (Gi) Other (See Comments)    UPSET STOMACH    VACCINATION STATUS: Immunization History  Administered Date(s) Administered  . Influenza,inj,Quad PF,36+ Mos 02/17/2014, 03/09/2015  . Pneumococcal Polysaccharide-23 01/05/2014  . Tdap 12/19/2010    HPI  Mr. Barley is a 10 - yr-old male patient with medical history as above.  He was given RAI therapy on March 16, 2014  . He is now more consistent  taking his levothyroxine. He is on levothyroxine 175 g by mouth every morning.  He feels better. -  He admits that he forgets at least one day a week  - He also has controlled type 2 diabetes on metformin.    He denies cold intolerance.  Pt denies family history of thyroid dysfunction . He denies personal history of goiter. he has hx of a-fib, and CAD which required stent placement x 2.   Review of Systems  Constitutional:    +fatigue, no subjective hyperthermia/hypothermia. Eyes: no blurry vision, no xerophthalmia ENT: no sore throat, no nodules palpated in throat, no dysphagia/odynophagia, no hoarseness Cardiovascular: no CP/SOB/palpitations/leg swelling Respiratory: no cough/SOB Gastrointestinal: no N/V/D/C Musculoskeletal:  complains of right knee pain and left foot pain from gout.  Skin: no rashes Neurological: no tremors/numbness/tingling/dizziness Psychiatric: no depression/anxiety  Objective:    BP 134/81   Pulse 78   Ht 5\' 11"  (1.803 m)   Wt (!) 313 lb (142 kg)   BMI 43.65 kg/m   Wt Readings from Last 3 Encounters:  02/29/16 (!) 313 lb (142 kg)  02/16/16 (!) 316 lb 9.6 oz (143.6 kg)  12/01/15 (!) 311 lb 3.2 oz (141.2 kg)    Physical Exam Constitutional:  Walks with a cane due to  arthritis/gout  obese, in NAD Eyes: PERRLA, EOMI, no exophthalmos ENT: moist mucous membranes, no thyromegaly, no cervical lymphadenopathy Cardiovascular: RRR, No MRG Respiratory: CTA B Gastrointestinal: abdomen soft, NT, ND, BS+ Musculoskeletal:  strength intact in all 4 Skin: moist, warm, no rashes Neurological: no tremor with outstretched hands, DTR normal in all 4   CMP     Component Value Date/Time   NA 139 02/22/2016 0811   K 4.1 02/22/2016 0811   CL 103 02/22/2016 0811   CO2 26 02/22/2016 0811   GLUCOSE 111 (H) 02/22/2016 0811   BUN 13 02/22/2016 0811   CREATININE 1.26 (H) 02/22/2016 0811   CALCIUM 9.2 02/22/2016 0811   PROT 7.3 02/22/2016 0811   ALBUMIN 3.8 02/22/2016 0811   AST 18 02/22/2016 0811   ALT 11 02/22/2016 0811   ALKPHOS 72 02/22/2016 0811   BILITOT 0.9 02/22/2016 0811   GFRNONAA 62 02/22/2016 0811   GFRAA 71 02/22/2016 0811     Diabetic Labs (most recent): Lab Results  Component Value Date   HGBA1C 5.9 (H) 02/22/2016   HGBA1C 5.4 11/05/2015   HGBA1C 6.1 06/26/2015     Lipid Panel ( most recent) Lipid Panel     Component Value Date/Time   CHOL 130 02/22/2016 0811   TRIG 122 02/22/2016 0811   HDL 36 (L) 02/22/2016 0811   CHOLHDL 3.6 02/22/2016 0811   VLDL 24 02/22/2016 0811   LDLCALC 70 02/22/2016 0811    Results for NICHLAS, MORELAND (MRN [id number]) as of 02/29/2016 15:18  Ref. Range 10/12/2015 08:34 02/22/2016 08:11  TSH Latest Ref Range: 0.40 - 4.50 mIU/L 1.78 12.00 (H)  T4,Free(Direct) Latest Ref Range: 0.8 - 1.8 ng/dL 1.4 1.1    Assessment & Plan:   1. Hypothyroidism due to medicaments and other exogenous substances His TFTs are c/w inadequate replacement. This is mainly due to missed levothyroxine dose at least 1 day week. I urged him to stay consistent in taking this important hormone daily, I would increase his levothyroxine slightly to 200 g by mouth every morning.  He has a-flutter with triple CAD.  - We discussed about  correct intake of levothyroxine, at fasting, with water, separated by at least 30 minutes from breakfast, and separated by more than 4 hours from calcium, iron, multivitamins, acid reflux medications (PPIs). -Patient is  made aware of the fact that thyroid hormone replacement is needed for life, dose to be adjusted by periodic monitoring of thyroid function tests.   2. Diabetes mellitus type 2 in obese ([place]) -Controlled with A1c 5.9%. I approached him for low-dose metformin therapy.- 500 mg by mouth twice a day.  3. Essential hypertension - Improving /Uncontrolled. He did not take his blood pressure medications this morning. I advised him to be consistent in taking his blood pressure medications.  4. Obesity: I counseled him on low-carb diet , he is losing weight currently due to appropriate intake of his thyroid hormone replacement.  - I advised patient to maintain close follow up with [name], MD for primary care needs. Follow up plan: Return in about 6 months (around 08/28/2016) for follow up with pre-visit labs.  Glade Lloyd, MD Phone: [phone]  Fax: [phone]   02/29/2016, 3:23 PM

## 2016-02-29 NOTE — Patient Instructions (Signed)

## 2016-03-01 ENCOUNTER — Encounter: Payer: Self-pay | Admitting: Radiation Oncology

## 2016-03-01 ENCOUNTER — Ambulatory Visit
Admission: RE | Admit: 2016-03-01 | Discharge: 2016-03-01 | Disposition: A | Discharge status: Home / Self Care | Payer: Medicare Other | Source: Ambulatory Visit | Attending: Radiation Oncology | Admitting: Radiation Oncology

## 2016-03-01 VITALS — BP 117/75 | HR 70 | Resp 18 | Ht 71.0 in | Wt 316.8 lb

## 2016-03-01 DIAGNOSIS — E785 Hyperlipidemia, unspecified: Secondary | ICD-10-CM | POA: Diagnosis not present

## 2016-03-01 DIAGNOSIS — E079 Disorder of thyroid, unspecified: Secondary | ICD-10-CM | POA: Diagnosis not present

## 2016-03-01 DIAGNOSIS — C61 Malignant neoplasm of prostate: Secondary | ICD-10-CM | POA: Diagnosis not present

## 2016-03-01 DIAGNOSIS — I252 Old myocardial infarction: Secondary | ICD-10-CM | POA: Insufficient documentation

## 2016-03-01 DIAGNOSIS — Z9889 Other specified postprocedural states: Secondary | ICD-10-CM | POA: Diagnosis not present

## 2016-03-01 DIAGNOSIS — Z87891 Personal history of nicotine dependence: Secondary | ICD-10-CM | POA: Diagnosis not present

## 2016-03-01 DIAGNOSIS — I10 Essential (primary) hypertension: Secondary | ICD-10-CM | POA: Diagnosis not present

## 2016-03-01 DIAGNOSIS — Z510 Encounter for antineoplastic radiation therapy: Secondary | ICD-10-CM | POA: Insufficient documentation

## 2016-03-01 DIAGNOSIS — K219 Gastro-esophageal reflux disease without esophagitis: Secondary | ICD-10-CM | POA: Diagnosis not present

## 2016-03-01 DIAGNOSIS — I495 Sick sinus syndrome: Secondary | ICD-10-CM | POA: Insufficient documentation

## 2016-03-01 DIAGNOSIS — Z950 Presence of cardiac pacemaker: Secondary | ICD-10-CM | POA: Insufficient documentation

## 2016-03-01 DIAGNOSIS — I2510 Atherosclerotic heart disease of native coronary artery without angina pectoris: Secondary | ICD-10-CM | POA: Diagnosis not present

## 2016-03-01 DIAGNOSIS — Z720 Tobacco use: Secondary | ICD-10-CM | POA: Diagnosis not present

## 2016-03-01 DIAGNOSIS — R972 Elevated prostate specific antigen [PSA]: Secondary | ICD-10-CM | POA: Diagnosis not present

## 2016-03-01 HISTORY — DX: Malignant neoplasm of prostate: C61

## 2016-03-01 NOTE — Progress Notes (Signed)
GU Location of Tumor / Histology: prostatic adenocarcinoma  If Prostate Cancer, Gleason Score is (4 + 3) and PSA is (9.09). Prostate volume: 25 cc.  Peter Dunlap was referred by his PCP, Dr. Buelah Manis, on 12/20/15 to Dr. Diona Fanti for evaluation of an evaluation of an elevated PSA.  Biopsies of prostate (if applicable) revealed:    Past/Anticipated interventions by urology, if any: prostate biopsy, bone scan (neg), CT of abdomen and pelvis (neg)  Past/Anticipated interventions by medical oncology, if any: no  Weight changes, if any: no  Bowel/Bladder complaints, if any: difficulty emptying his bladder completely, urinary frequency, urinary intermittency, urinary urgency, weak urine stream, straining to void, nocturia x 2, occasional leakage. Taking 60 mg of Lasix. Reports hematuria 3-4 year ago that resolved without intervention and denies hematuria presently. Reports intermittent mild dysuria. Reports occasional constipation. IPSS 23.  Nausea/Vomiting, if any: no  Pain issues, if any:  Low back pain worse at night and intermittently felt while urinating. Also, reports arthritis in right knee that causes pain. Patient ambulates with the aid of a cane.   SAFETY ISSUES:  Prior radiation? YES; radioactive iodine 2016  Pacemaker/ICD? YES and two stent  Possible current pregnancy? no  Is the patient on methotrexate? no  Current Complaints / other details:  60 year old male. Married.  [id number]

## 2016-03-01 NOTE — Progress Notes (Signed)
Radiation Oncology         [phone] ________________________________  Initial outpatient Consultation  Name: Peter Dunlap MRN: [id number]  Date: 03/01/2016  DOB: Nov 22, 1955  [id number], Peter Grandchild, MD  Peter Gallo, MD   REFERRING PHYSICIAN: Franchot Gallo, MD  DIAGNOSIS: The encounter diagnosis was Malignant neoplasm of prostate [facility]).    ICD-9-CM ICD-10-CM   1. Malignant neoplasm of prostate (Peter Dunlap) 185 C61     HISTORY OF PRESENT ILLNESS: Peter Dunlap is a 60 y.o. male with a new diagnosis of prostate cancer. He was noted to have an elevated PSA of 10/05 on 11/13/15, and rechecked at 9.09 on 12/21/15 by his primary care physician.  Accordingly, he was referred for evaluation in urology by Dr. Alyson Dunlap and Dr. Romilda Dunlap in August 2017,  digital rectal examination was performed at that time revealing no palpable nodules.  The patient proceeded to transrectal ultrasound with 12 biopsies of the prostate on 01/18/16.  The prostate volume measured 25 cc.  Out of 12 core biopsies,10 were positive.  The maximum Gleason score was 4+3, and this was seen in the left lateral mid, and left later base, and left mid base, and left mid gland.  The patient reviewed the biopsy results with his urologist and he has kindly been referred today for discussion of potential radiation treatment options. He is not a good candidate for surgery given his cardiovascular history.  PREVIOUS RADIATION THERAPY: Yes Radioactive Iodine given in 2016 for thyroid cancer  PAST MEDICAL HISTORY:  Past Medical History:  Diagnosis Date  . Arteriosclerotic cardiovascular disease (ASCVD) 2005   catheterization in 10/2010:50% mid LAD, diffuse distal disease, circumflex irregularities, large dominant RCA with a 50% ostial, 70% distal, 60% posterolateral and 70% PDA; normal EF  . Arthritis   . Benign prostatic hypertrophy   . Cerebrovascular disease 2010   R. carotid endarterectomy; Duplex in 10/2010-widely patent  ICAs, subtotal left vertebral-not thought to be contributing to symptoms  . Cervical spine disease    CT in 2012-advanced degeneration and spondylosis with moderate spinal stenosis at C3-C6  . Depression   . Erectile dysfunction   . Gastroesophageal reflux disease   . H/O hiatal hernia   . H/O: substance abuse    Cocaine, marijuana, alcohol.  Quit 2013.   [name] Hyperlipidemia   . Hypertension   . Non-ST elevation myocardial infarction (NSTEMI), initial episode of care Peter Dunlap) 12/02/2013   DES LAD  . Obesity   . Prostate cancer ([place])   . Sleep apnea    CPAP  . Tachy-brady syndrome ([place])    a. s/p STJ dual chamber PPM   . Thyroid disease   . Tobacco abuse    Quit 2014      PAST SURGICAL HISTORY: Past Surgical History:  Procedure Laterality Date  . BRAIN SURGERY  2015   hematoma evacuation  . BURR HOLE Right 04/13/2014   Procedure: Haskell Flirt;  Surgeon: Charlie Pitter, MD;  Location: [facility] NEURO ORS;  Service: Neurosurgery;  Laterality: Right;  . CAROTID ENDARTERECTOMY Right Feb. 25, 2010    CEA  . CORONARY ANGIOPLASTY WITH STENT PLACEMENT  12/03/2013   LAD 90%-->0% W/ Promus Premier DES 3.0 mm x 16 mm, CFX OK, RCA 40%, EF 70-75%  . LEFT ATRIAL APPENDAGE OCCLUSION N/A 08/05/2015   Procedure: LEFT ATRIAL APPENDAGE OCCLUSION;  Surgeon: [name], MD;  Location: [facility] CV LAB;  Service: Cardiovascular;  Laterality: N/A;  . LEFT HEART CATHETERIZATION WITH CORONARY ANGIOGRAM Left 12/03/2013  Procedure: LEFT HEART CATHETERIZATION WITH CORONARY ANGIOGRAM;  Surgeon: Leonie Man, MD;  Location: [facility] CATH LAB;  Service: Cardiovascular;  Laterality: Left;  . LEFT HEART CATHETERIZATION WITH CORONARY ANGIOGRAM N/A 01/26/2014   Procedure: LEFT HEART CATHETERIZATION WITH CORONARY ANGIOGRAM;  Surgeon: Jettie Booze, MD;  Location: [facility] CATH LAB;  Service: Cardiovascular;  Laterality: N/A;  . LEFT HEART CATHETERIZATION WITH CORONARY ANGIOGRAM N/A 08/03/2014   Procedure: LEFT HEART CATHETERIZATION  WITH CORONARY ANGIOGRAM;  Surgeon: Burnell Blanks, MD;  Location: [facility] CATH LAB;  Service: Cardiovascular;  Laterality: N/A;  . PERCUTANEOUS CORONARY STENT INTERVENTION (PCI-S)  12/03/2013   Procedure: PERCUTANEOUS CORONARY STENT INTERVENTION (PCI-S);  Surgeon: Leonie Man, MD;  Location: [facility] CATH LAB;  Service: Cardiovascular;;  . PERMANENT PACEMAKER INSERTION N/A 09/18/2014   Procedure: PERMANENT PACEMAKER INSERTION;  Surgeon: [name], MD;  Location: [facility] CATH LAB;  Service: Cardiovascular;  Laterality: N/A;  . RADIOFREQUENCY ABLATION  2005   for PSVT  . TEE WITHOUT CARDIOVERSION N/A 07/27/2015   Procedure: TRANSESOPHAGEAL ECHOCARDIOGRAM (TEE);  Surgeon: Lelon Perla, MD;  Location: [facility] ENDOSCOPY;  Service: Cardiovascular;  Laterality: N/A;  . TEE WITHOUT CARDIOVERSION N/A 09/15/2015   Procedure: TRANSESOPHAGEAL ECHOCARDIOGRAM (TEE);  Surgeon: Thayer Headings, MD;  Location: [facility] ENDOSCOPY;  Service: Cardiovascular;  Laterality: N/A;    FAMILY HISTORY:  Family History  Problem Relation Age of Onset  . Hypertension Mother     Cerebrovascular disease  . Diabetes Mother   . Coronary artery disease Father 29  . Diabetes type II Father   . Hypertension Father   . Heart attack Father   . Diabetes Brother   . Hypertension Brother   . Lung cancer Paternal Uncle   . Diabetes Sister   . Hypertension Sister   . Heart attack Sister 25  . Cancer Sister     leukemia  . Cancer Maternal Uncle     breast  . Cancer Maternal Grandmother     breast    SOCIAL HISTORY:  Social History   Social History  . Marital status: Married    Spouse name: N/A  . Number of children: 0  . Years of education: N/A   Occupational History  . Retired    Social History Main Topics  . Smoking status: Former Smoker    Packs/day: 1.00    Years: 40.00    Types: Cigarettes    Start date: 10/20/1972    Quit date: 10/10/2012  . Smokeless tobacco: Never Used     Comment: Quit in May.   . Alcohol use  No     Comment: former drinker-- sober since 2013.   . Drug use: No     Comment: quit cocaine 10/2011  . Sexual activity: Yes    Partners: Female   Other Topics Concern  . Not on file   Social History Narrative   Lives in [place].  The patient is married and lives in [place]. He is retired from working in [employer].  ALLERGIES: Lactose intolerance (gi)  MEDICATIONS:  Current Outpatient Prescriptions  Medication Sig Dispense Refill  . allopurinol (ZYLOPRIM) 100 MG tablet TAKE 1 TABLET BY MOUTH EVERY DAY 90 tablet 6  . aspirin EC 325 MG EC tablet Take 1 tablet (325 mg total) by mouth daily.    . colchicine 0.6 MG tablet Take 1 tablet (0.6 mg total) by mouth daily. 15 tablet 0  . DEXILANT 60 MG capsule TAKE 1 CAPSULE BY MOUTH EVERY DAY 30 capsule  3  . furosemide (LASIX) 20 MG tablet TAKE 3 TABLETS BY MOUTH DAILY 90 tablet 6  . isosorbide mononitrate (IMDUR) 60 MG 24 hr tablet TAKE 1 TABLET BY MOUTH EVERY DAY 90 tablet 2  . levothyroxine (SYNTHROID, LEVOTHROID) 200 MCG tablet Take 1 tablet (200 mcg total) by mouth daily before breakfast. 30 tablet 6  . Magnesium Oxide 400 MG CAPS Take 1 capsule (400 mg total) by mouth 2 (two) times daily. 60 capsule 11  . metFORMIN (GLUCOPHAGE) 500 MG tablet TAKE 1 TABLET(500 MG) BY MOUTH TWICE DAILY WITH A MEAL 60 tablet 2  . metoprolol tartrate (LOPRESSOR) 25 MG tablet TAKE 1 TABLET(25 MG) BY MOUTH TWICE DAILY 180 tablet 3  . nitroGLYCERIN (NITROSTAT) 0.4 MG SL tablet Place 0.4 mg under the tongue every 5 (five) minutes as needed for chest pain (x 3 doses).    . ONE TOUCH ULTRA TEST test strip USE TO TEST ONCE DAILY 50 each 11  . oxyCODONE-acetaminophen (ROXICET) 5-325 MG tablet Take 1 tablet by mouth every 6 (six) hours as needed for severe pain. 30 tablet 0  . potassium chloride SA (K-DUR,KLOR-CON) 20 MEQ tablet TAKE 1 TABLET BY MOUTH EVERY DAY 30 tablet 6  . RAPAFLO 8 MG CAPS capsule TAKE 1 CAPSULE(8 MG) BY MOUTH DAILY 30 capsule 6  .  simvastatin (ZOCOR) 40 MG tablet TAKE 1 TABLET(40 MG) BY MOUTH DAILY 30 tablet 2  . sotalol (BETAPACE) 120 MG tablet Take 1 tablet (120 mg total) by mouth 2 (two) times daily. 60 tablet 5  . traZODone (DESYREL) 50 MG tablet TAKE 1/2 TO 1 TABLET BY MOUTH AT BEDTIME AS NEEDED FOR SLEEP (Patient not taking: Reported on 03/01/2016) 30 tablet 3   No current facility-administered medications for this encounter.    Facility-Administered Medications Ordered in Other Encounters  Medication Dose Route Frequency Provider Last Rate Last Dose  . 0.9 %  sodium chloride infusion   Intravenous Continuous [name], MD        REVIEW OF SYSTEMS:  On review of systems, the patient reports that he is doing well overall. He denies any chest pain, shortness of breath, cough, fevers, chills, night sweats, unintended weight changes. He denies any bowel  disturbances, and denies abdominal pain, nausea or vomiting. He denies any new musculoskeletal or joint aches or pains. The IPSS score was collected and was 23. He is able to complete sexual activity with all attempts. A complete review of systems is obtained and is otherwise negative.    PHYSICAL EXAM:  height is 5\' 11"  (1.803 m) and weight is 316 lb 12.8 oz (143.7 kg) (abnormal). His blood pressure is 117/75 and his pulse is 70. His respiration is 18 and oxygen saturation is 100%.   Pain Scale 0/10 In general this is a well appearing African American male in no acute distress. He is alert and oriented x4 and appropriate throughout the examination. HEENT reveals that the patient is normocephalic, atraumatic. EOMs are intact. PERRLA. Skin is intact without any evidence of gross lesions. Cardiovascular exam reveals a regular rate and rhythm, no clicks rubs or murmurs are auscultated. Chest is clear to auscultation bilaterally, and pacemaker is felt on the left chest wall. Lymphatic assessment is performed and does not reveal any adenopathy in the cervical,  supraclavicular, axillary, or inguinal chains. Abdomen has active bowel sounds in all quadrants and is intact. The abdomen is soft, non tender, non distended. Lower extremities are negative for pretibial pitting edema, deep calf  tenderness, cyanosis or clubbing.   KPS = 90  100 - Normal; no complaints; no evidence of disease. 90   - Able to carry on normal activity; minor signs or symptoms of disease. 80   - Normal activity with effort; some signs or symptoms of disease. 37   - Cares for self; unable to carry on normal activity or to do active work. 60   - Requires occasional assistance, but is able to care for most of his personal needs. 50   - Requires considerable assistance and frequent medical care. 70   - Disabled; requires special care and assistance. 37   - Severely disabled; hospital admission is indicated although death not imminent. 44   - Very sick; hospital admission necessary; active supportive treatment necessary. 10   - Moribund; fatal processes progressing rapidly. 0     - Dead  Karnofsky DA, Abelmann [place], Craver LS and Burchenal [facility] [phone]) The use of the nitrogen mustards in the palliative treatment of carcinoma: with particular reference to bronchogenic carcinoma Cancer 1 634-56  LABORATORY DATA:  Lab Results  Component Value Date   WBC 8.1 11/05/2015   HGB 16.9 11/05/2015   HCT 49.6 11/05/2015   MCV 94.3 11/05/2015   PLT 245 11/05/2015   Lab Results  Component Value Date   NA 139 02/22/2016   K 4.1 02/22/2016   CL 103 02/22/2016   CO2 26 02/22/2016   Lab Results  Component Value Date   ALT 11 02/22/2016   AST 18 02/22/2016   ALKPHOS 72 02/22/2016   BILITOT 0.9 02/22/2016     RADIOGRAPHY: Nm Bone Scan Whole Body  Result Date: 02/08/2016 CLINICAL DATA:  60 year old with recent diagnosis of prostate cancer. Staging. Chronic low back pain. Restricted movement in both shoulders. EXAM: NUCLEAR MEDICINE WHOLE BODY BONE SCAN TECHNIQUE: Whole body anterior and  posterior images were obtained approximately 3 hours after intravenous injection of radiopharmaceutical. RADIOPHARMACEUTICALS:  19.7 mCi Technetium-98m MDP IV COMPARISON:  No prior nuclear imaging. Right knee x-rays 11/28/2015 and 06/12/2014, right foot x-rays 08/05/2015, left ankle x-rays 03/12/2015 are correlated. FINDINGS: Increased activity localizing to the humeral head and neck bilaterally, the acromioclavicular joints, the right femoral neck, both knees, the ankles, and both feet. Physiologic excretion of the radiopharmaceutical through the urinary tract. Patient was mildly incontinent of urine accounting for extraosseous activity overlying the pelvis. IMPRESSION: 1. Likely degenerative activity involving both shoulders, both knees, both ankles and both feet. 2. No convincing evidence of osseous metastatic disease. Electronically Signed   By: Evangeline Dakin M.D.   On: 02/08/2016 14:38   Ct Abdomen Pelvis W Contrast  Result Date: 02/17/2016 CLINICAL DATA:  Newly diagnosed prostate cancer EXAM: CT ABDOMEN AND PELVIS WITH CONTRAST TECHNIQUE: Multidetector CT imaging of the abdomen and pelvis was performed using the standard protocol following bolus administration of intravenous contrast. CONTRAST:  169mL ISOVUE-300 IOPAMIDOL (ISOVUE-300) INJECTION 61% COMPARISON:  None. FINDINGS: Lower chest: Lung bases are clear. Cardiomegaly. Small pericardial effusion. Pacemaker wires, incompletely visualized. Hepatobiliary: Hepatic steatosis with focal fatty sparing along the gallbladder fossa. Gallbladder is unremarkable. No intrahepatic or extrahepatic ductal dilatation. Pancreas: Within normal limits. Spleen: Within normal limits. Adrenals/Urinary Tract: Adrenal glands are within normal limits. Kidneys are within normal limits.  No hydronephrosis. Bladder is mildly thick-walled although underdistended. Stomach/Bowel: Stomach is notable for a tiny hiatal hernia. No evidence of bowel obstruction. Normal appendix  (series 2/ image 58). Vascular/Lymphatic: Atherosclerotic calcifications of the abdominal aorta and branch vessels. No evidence of  abdominal aortic aneurysm. No suspicious abdominopelvic lymphadenopathy. Reproductive: Prostate is unremarkable on CT in this patient with known prostate cancer. Other: No abdominopelvic ascites. Musculoskeletal: Degenerative changes of the visualized thoracolumbar spine, most prominent at L4-5. No focal osseous lesions. IMPRESSION: Prostate is unremarkable in this patient with known prostate cancer. No findings suspicious for metastatic disease. Hepatic steatosis. Electronically Signed   By: Julian Hy M.D.   On: 02/17/2016 14:52      IMPRESSION/PLAN: 1. 60 y.o. gentleman with stage T1c, adenocarcinoma of the prostate with a Gleason's Score of 4+3, and a PSA of 9.09. Dr. Tammi Klippel discusses the findings and work up so far. He reviews the rationale for radiotherapy given that he is not an ideal surgical candidate. Dr. Tammi Klippel reviews the options for external beam radiotherapy versus brachytherapy, however the patient is not a good candidate for brachytherapy due to his IPSS score of 23. Rather, he offers the patient 40 fractions of IMRT over 8 weeks. We will set him up with Dr. Romilda Dunlap for gold fiducial markers. We discussed the risks, benefits, short, and long term effects of treatment. Once this has been scheduled, we will also proceed with CT simulation.  2. Pacemaker disposition. The patient's cardiologist will be consulted to obtain clearance to move forward.   The above documentation reflects my direct findings during this shared patient visit. Please see the separate note by Dr. Tammi Klippel on this date for the remainder of the patient's plan of care.   Carola Rhine, PAC

## 2016-03-01 NOTE — Progress Notes (Signed)
See progress note under physician encounter. 

## 2016-03-02 ENCOUNTER — Other Ambulatory Visit: Payer: Self-pay | Admitting: Family Medicine

## 2016-03-02 ENCOUNTER — Telehealth: Payer: Self-pay | Admitting: *Deleted

## 2016-03-02 NOTE — Telephone Encounter (Signed)
CALLED PATIENT TO INFORM OF GOLD SEED PLACEMENT ON 03-28-16- ARRIVAL TIME - 11:30 AM @ DR. DAHLSTEDT'S OFFICE IN [place] AND HIS SIM ON 03-31-16 @ 3 PM WITH DR. MANNING, SPOKE WITH PATIENT'S WIFE- EMILY AND SHE IS AWARE OF THESE APPTS.

## 2016-03-06 ENCOUNTER — Ambulatory Visit: Payer: Medicare Other | Admitting: Internal Medicine

## 2016-03-06 ENCOUNTER — Ambulatory Visit: Payer: Medicare Other | Admitting: Radiation Oncology

## 2016-03-06 ENCOUNTER — Other Ambulatory Visit: Payer: Self-pay

## 2016-03-06 NOTE — Patient Outreach (Signed)
[facility]) Care Management  [place]  03/06/2016   Peter Dunlap [birth year] [id number]  Subjective: Telephone call to patient for monthly call.  Patient reports he is doing ok. Patient states he is to get radiation marking this month.  Patient reports that his bone biopsy did not show any cancer in his bones and he states he is thankful for that.  Patient reports recent increase in thyroid medication to 200 mcg.Patient also shares he had an increase in his Betapace due to pacemaker report reporting that his heart rate was increased.  Patient reports more knee pain especially when he is up on them.  He states he is going to talk to his physician on his appointment this week about possible motorized scooter.  Patient reports blood sugars in the 90's. Patient reports he tries to watch his diet.  Reinforced with patient importance of diet in order to control sugars. He verbalized understanding.   Objective:   Encounter Medications:  Outpatient Encounter Prescriptions as of 03/06/2016  Medication Sig  . allopurinol (ZYLOPRIM) 100 MG tablet TAKE 1 TABLET BY MOUTH EVERY DAY  . aspirin EC 325 MG EC tablet Take 1 tablet (325 mg total) by mouth daily.  . colchicine 0.6 MG tablet Take 1 tablet (0.6 mg total) by mouth daily.  [name] DEXILANT 60 MG capsule TAKE 1 CAPSULE BY MOUTH EVERY DAY  . isosorbide mononitrate (IMDUR) 60 MG 24 hr tablet TAKE 1 TABLET BY MOUTH EVERY DAY  . levothyroxine (SYNTHROID, LEVOTHROID) 200 MCG tablet Take 1 tablet (200 mcg total) by mouth daily before breakfast.  . Magnesium Oxide 400 MG CAPS Take 1 capsule (400 mg total) by mouth 2 (two) times daily.  . metFORMIN (GLUCOPHAGE) 500 MG tablet TAKE 1 TABLET(500 MG) BY MOUTH TWICE DAILY WITH A MEAL  . metoprolol tartrate (LOPRESSOR) 25 MG tablet TAKE 1 TABLET(25 MG) BY MOUTH TWICE DAILY  . nitroGLYCERIN (NITROSTAT) 0.4 MG SL tablet Place 0.4 mg under the tongue every 5 (five) minutes as needed for chest  pain (x 3 doses).  . ONE TOUCH ULTRA TEST test strip USE TO TEST ONCE DAILY  . oxyCODONE-acetaminophen (ROXICET) 5-325 MG tablet Take 1 tablet by mouth every 6 (six) hours as needed for severe pain.  . potassium chloride SA (K-DUR,KLOR-CON) 20 MEQ tablet TAKE 1 TABLET BY MOUTH EVERY DAY  . RAPAFLO 8 MG CAPS capsule TAKE 1 CAPSULE(8 MG) BY MOUTH DAILY  . simvastatin (ZOCOR) 40 MG tablet TAKE 1 TABLET(40 MG) BY MOUTH DAILY  . sotalol (BETAPACE) 120 MG tablet Take 1 tablet (120 mg total) by mouth 2 (two) times daily.  . furosemide (LASIX) 20 MG tablet TAKE 3 TABLETS BY MOUTH DAILY  . traZODone (DESYREL) 50 MG tablet TAKE 1/2 TO 1 TABLET BY MOUTH AT BEDTIME AS NEEDED FOR SLEEP (Patient not taking: Reported on 03/06/2016)   Facility-Administered Encounter Medications as of 03/06/2016  Medication  . 0.9 %  sodium chloride infusion    Functional Status:  In your present state of health, do you have any difficulty performing the following activities: 08/05/2015 08/05/2015  Hearing? - N  Vision? - N  Difficulty concentrating or making decisions? - N  Walking or climbing stairs? - N  Dressing or bathing? - N  Doing errands, shopping? N -  Preparing Food and eating ? - -  Using the Toilet? - -  In the past six months, have you accidently leaked urine? - -  Do you have problems with  loss of bowel control? - -  Managing your Medications? - -  Managing your Finances? - -  Housekeeping or managing your Housekeeping? - -  Some recent data might be hidden    Fall/Depression Screening: PHQ 2/9 Scores 03/06/2016 03/01/2016 02/04/2016 01/04/2016 12/09/2015 11/11/2015 11/05/2015  PHQ - 2 Score 0 0 0 0 0 0 0  PHQ- 9 Score - - - - - - -    Assessment: Patient continues to benefit from health coach outreach for disease management and support.   Plan:  [facility] CM Care Plan Problem One   Flowsheet Row Most Recent Value  Care Plan Problem One  Diabetes Knowledge deficit  Role Documenting the Problem One  Health Coach   Care Plan for Problem One  Active  [facility] Long Term Goal (31-90 days)  Patient will verbalize care for diabetes such as diet  and blood sugar monitoring within the next 90 days.  [facility] Long Term Goal Start Date  03/06/16  Interventions for Problem One [place] reinforced with patient foods high in carbohydrates and reasons to monitor blood sugar regularly.       RN Health Coach will contact patient in the month of November and patient agrees to next outreach.  [name], RN, MSN [place] [phone]

## 2016-03-08 ENCOUNTER — Other Ambulatory Visit: Payer: Self-pay | Admitting: Internal Medicine

## 2016-03-08 NOTE — Telephone Encounter (Signed)
sotalol (BETAPACE) 120 MG tablet  Medication  Date: 02/16/2016 Department: [address] Office Ordering/Authorizing: Patsey Berthold, NP  Order Providers   Prescribing Provider Encounter Provider  Amber Sena Slate, NP Patsey Berthold, NP  Supervision Information   Supervising Provider Type of Supervision  [name], MD Supervision Required  Medication Detail    Disp Refills Start End   sotalol (BETAPACE) 120 MG tablet 60 tablet 5 02/16/2016    Sig - Route: Take 1 tablet (120 mg total) by mouth 2 (two) times daily. - Oral   E-Prescribing Status: Receipt confirmed by pharmacy (02/16/2016 11:52 AM EDT)   Pharmacy   [id number] - [place], [place]

## 2016-03-10 ENCOUNTER — Ambulatory Visit (INDEPENDENT_AMBULATORY_CARE_PROVIDER_SITE_OTHER): Payer: Medicare Other | Admitting: Family Medicine

## 2016-03-10 ENCOUNTER — Encounter: Payer: Self-pay | Admitting: Family Medicine

## 2016-03-10 VITALS — BP 136/80 | HR 66 | Temp 98.7°F | Resp 14 | Ht 71.0 in | Wt 316.0 lb

## 2016-03-10 DIAGNOSIS — G4733 Obstructive sleep apnea (adult) (pediatric): Secondary | ICD-10-CM | POA: Diagnosis not present

## 2016-03-10 DIAGNOSIS — E7800 Pure hypercholesterolemia, unspecified: Secondary | ICD-10-CM | POA: Diagnosis not present

## 2016-03-10 DIAGNOSIS — M1711 Unilateral primary osteoarthritis, right knee: Secondary | ICD-10-CM

## 2016-03-10 DIAGNOSIS — Z23 Encounter for immunization: Secondary | ICD-10-CM

## 2016-03-10 DIAGNOSIS — C61 Malignant neoplasm of prostate: Secondary | ICD-10-CM | POA: Diagnosis not present

## 2016-03-10 DIAGNOSIS — I5032 Chronic diastolic (congestive) heart failure: Secondary | ICD-10-CM | POA: Diagnosis not present

## 2016-03-10 DIAGNOSIS — R2681 Unsteadiness on feet: Secondary | ICD-10-CM

## 2016-03-10 DIAGNOSIS — I10 Essential (primary) hypertension: Secondary | ICD-10-CM | POA: Diagnosis not present

## 2016-03-10 MED ORDER — OXYCODONE-ACETAMINOPHEN 5-325 MG PO TABS: 1.0000 | ORAL_TABLET | Freq: Four times a day (QID) | ORAL | 0 refills | Status: DC | PRN

## 2016-03-10 NOTE — Patient Instructions (Addendum)
Pain medication increased Send your scale back to be calibrated Call insurance about motorized scooter Physical therapy referral for mobility/scooter exam Release records- [place]  F/U 4 months

## 2016-03-10 NOTE — Assessment & Plan Note (Signed)
Controlled, no change to meds 

## 2016-03-10 NOTE — Assessment & Plan Note (Signed)
Continue current treatment plan as outlined by urology

## 2016-03-10 NOTE — Assessment & Plan Note (Signed)
He appears compensated with regards to his heart failure. Recommend that he send his scale back in to have this calibrated. He is continued on his diuretic

## 2016-03-10 NOTE — Assessment & Plan Note (Signed)
On review of medications and devices he is using his CPAP as prescribed he continues to get benefit from this

## 2016-03-10 NOTE — Assessment & Plan Note (Signed)
Significant ostial arthritis of knee has also had gout. We'll increase his pain medications that he can take 2 tablets a day #60 quantity Regards to how this affects his mobility will send him to physical therapy for mobility examination he will check with his insurance company about the motorized scooter

## 2016-03-10 NOTE — Progress Notes (Signed)
Subjective:    Patient ID: Peter Dunlap, male    DOB: June 02, 1956, 60 y.o.   MRN: [id number]  Patient presents for 4 month F/U (is fasting) Patient here for follow-up on chronic medical problems. He has been seen by multiple specialists. He is planning to start treatment for prostate cancer he is point with oncology in a couple weeks of places markers. He does have difficulty with his urination and occasionally gets some flank pain when he cannot go readily.  He is still followed by cardiology has not had any chest pain or shortness of breath but he has had difficulties with his home monitoring scale. His weight has fluctuated 10 pounds or more. But he states he has not noticed any difference in the fluid in his body. He is unsure if he needs this and has scaled back.   Chronic knee pain he has had knee surgery also had gout in his knee. He states that orthopedics does not have anything further they could intervene on. He is wearing a brace daily he uses a cane or even his rolling walker but this is becoming more difficult when he is in the grocery store or going to doctor's offices. He is interested in getting a motorized scooter.  Reviewed last endocrinology no. He is on metformin and his last A1c 5.9% his Synthroid was adjusted recently    Review Of Systems:  GEN- denies fatigue, fever, weight loss,weakness, recent illness HEENT- denies eye drainage, change in vision, nasal discharge, CVS- denies chest pain, palpitations RESP- denies SOB, cough, wheeze ABD- denies N/V, change in stools, abd pain GU- denies dysuria, hematuria, dribbling, incontinence MSK- +joint pain, muscle aches, injury Neuro- denies headache, dizziness, syncope, seizure activity       Objective:    BP 136/80 (BP Location: Left Arm, Patient Position: Sitting, Cuff Size: Large)   Pulse 66   Temp 98.7 F (37.1 C) (Oral)   Resp 14   Ht 5\' 11"  (1.803 m)   Wt (!) 316 lb (143.3 kg)   BMI 44.07 kg/m  GEN-  NAD, alert and oriented x3 HEENT- PERRL, EOMI, non injected sclera, pink conjunctiva, MMM, oropharynx clear Neck- Supple, no thyromegaly CVS- RRR, 2/6 SEM RESP-CTAB ABD-NABS,soft,NT,ND MSK- antalgic gait, bracing right knee  EXT- No edema Pulses- Radial, DP- 2+        Assessment & Plan:      Problem List Items Addressed This Visit    Prostate cancer ([place])    Continue current treatment plan as outlined by urology      OSA (obstructive sleep apnea)    On review of medications and devices he is using his CPAP as prescribed he continues to get benefit from this      OA (osteoarthritis) of knee    Significant ostial arthritis of knee has also had gout. We'll increase his pain medications that he can take 2 tablets a day #60 quantity Regards to how this affects his mobility will send him to physical therapy for mobility examination he will check with his insurance company about the motorized scooter      Relevant Medications   oxyCODONE-acetaminophen (ROXICET) 5-325 MG tablet   Other Relevant Orders   Ambulatory referral to Physical Therapy   Hypertension - Primary (Chronic)    Controlled, no change to meds      Hyperlipidemia (Chronic)   Chronic diastolic heart failure ([place])    He appears compensated with regards to his heart failure. Recommend that he send  his scale back in to have this calibrated. He is continued on his diuretic       Other Visit Diagnoses    Need for prophylactic vaccination and inoculation against influenza       Relevant Orders   Flu Vaccine QUAD 36+ mos PF IM (Fluarix & Fluzone Quad PF) (Completed)   Gait instability       Relevant Orders   Ambulatory referral to Physical Therapy      Note: This dictation was prepared with Dragon dictation along with smaller phrase technology. Any transcriptional errors that result from this process are unintentional.

## 2016-03-12 ENCOUNTER — Other Ambulatory Visit: Payer: Self-pay | Admitting: Internal Medicine

## 2016-03-22 ENCOUNTER — Telehealth: Payer: Self-pay | Admitting: Radiation Oncology

## 2016-03-22 NOTE — Telephone Encounter (Signed)
Left message with [name], MD medical record department inquiring about status of pacemaker form faxed on 03/01/16. Awaiting return call. Patient scheduled to start xrt on 03/31/16.

## 2016-03-24 NOTE — Progress Notes (Signed)
rescheduled

## 2016-03-28 ENCOUNTER — Ambulatory Visit (HOSPITAL_COMMUNITY): Admission: RE | Admit: 2016-03-28 | Payer: Medicare Other | Source: Ambulatory Visit

## 2016-03-28 ENCOUNTER — Other Ambulatory Visit: Payer: Self-pay | Admitting: Radiation Oncology

## 2016-03-28 ENCOUNTER — Other Ambulatory Visit: Payer: Self-pay | Admitting: Urology

## 2016-03-28 DIAGNOSIS — C61 Malignant neoplasm of prostate: Secondary | ICD-10-CM

## 2016-03-29 ENCOUNTER — Ambulatory Visit (HOSPITAL_COMMUNITY): Payer: Medicare Other | Attending: Family Medicine | Admitting: Physical Therapy

## 2016-03-29 DIAGNOSIS — G8929 Other chronic pain: Secondary | ICD-10-CM

## 2016-03-29 DIAGNOSIS — R262 Difficulty in walking, not elsewhere classified: Secondary | ICD-10-CM

## 2016-03-29 DIAGNOSIS — M25561 Pain in right knee: Secondary | ICD-10-CM | POA: Diagnosis not present

## 2016-03-29 DIAGNOSIS — Z9181 History of falling: Secondary | ICD-10-CM

## 2016-03-29 DIAGNOSIS — M6281 Muscle weakness (generalized): Secondary | ICD-10-CM | POA: Diagnosis not present

## 2016-03-29 DIAGNOSIS — R2681 Unsteadiness on feet: Secondary | ICD-10-CM

## 2016-03-29 NOTE — Patient Instructions (Addendum)
Dorsiflexion: Resisted   May do this sitting  Facing anchor, tubing around left foot, pull toward face.  Repeat _10___ times per set. Do _1___ sets per session. Do ___2_ sessions per day.  http://orth.exer.us/8   Copyright  VHI. All rights reserved.  Knee Extension (Sitting)    Place ___0_ pound weight on left ankle and straighten knee fully, lower slowly. Repeat __10__ times per set. Do 1____ sets per session. Do __2__ sessions per day.  http://orth.exer.us/732   Copyright  VHI. All rights reserved.  Strengthening: Quadriceps Set    Tighten muscles on top of thighs by pushing knees down into surface. Hold _5___ seconds. Repeat 10___ times per set. Do _1___ sets per session. Do __2__ sessions per day.  http://orth.exer.us/602   Copyright  VHI. All rights reserved.  Strengthening: Straight Leg Raise (Phase 1)    Tighten muscles on front of right thigh, then lift leg _12___ inches from surface, keeping knee locked.  Repeat _10___ times per set. Do ___1_ sets per session. Do ___2_ sessions per day.  http://orth.exer.us/614   Copyright  VHI. All rights reserved.  Bridging    Slowly raise buttocks from floor, keeping stomach tight. Repeat _10___ times per set. Do _1___ sets per session. Do __2__ sessions per day.  http://orth.exer.us/1096   Copyright  VHI. All rights reserved.  Self-Mobilization: Heel Slide (Supine)    Slide right  heel toward buttocks until a gentle stretch is felt. Hold __5__ seconds. Relax. Repeat ___10_ times per set. Do _1___ sets per session. Do _2___ sessions per day.  http://orth.exer.us/710   Copyright  VHI. All rights reserved.

## 2016-03-29 NOTE — Therapy (Signed)
[facility] [place], [place], [address] Phone: [phone]   Fax:  [phone]  Physical Therapy Evaluation  Patient Details  Name: Peter Dunlap MRN: [id number] Date of Birth: [birth year] Referring Provider: Dr. Vic Dunlap  Encounter Date: 03/29/2016      PT End of Session - 03/29/16 1333    Visit Number 1   Number of Visits 8   Date for PT Re-Evaluation 04/28/16   Authorization Type [employer] medicare   Authorization - Visit Number 1   Authorization - Number of Visits 8   PT Start Time 0905   PT Stop Time 0950   PT Time Calculation (min) 45 min   Activity Tolerance Patient tolerated treatment well   Behavior During Therapy [facility] for tasks assessed/performed      Past Medical History:  Diagnosis Date  . Arteriosclerotic cardiovascular disease (ASCVD) 2005   catheterization in 10/2010:50% mid LAD, diffuse distal disease, circumflex irregularities, large dominant RCA with a 50% ostial, 70% distal, 60% posterolateral and 70% PDA; normal EF  . Arthritis   . Benign prostatic hypertrophy   . Cerebrovascular disease 2010   R. carotid endarterectomy; Duplex in 10/2010-widely patent ICAs, subtotal left vertebral-not thought to be contributing to symptoms  . Cervical spine disease    CT in 2012-advanced degeneration and spondylosis with moderate spinal stenosis at C3-C6  . Depression   . Erectile dysfunction   . Gastroesophageal reflux disease   . H/O hiatal hernia   . H/O: substance abuse    Cocaine, marijuana, alcohol.  Quit 2013.   [name] Hyperlipidemia   . Hypertension   . Non-ST elevation myocardial infarction (NSTEMI), initial episode of care [facility]) 12/02/2013   DES LAD  . Obesity   . Prostate cancer ([place])   . Sleep apnea    CPAP  . Tachy-brady syndrome ([place])    a. s/p STJ dual chamber PPM   . Thyroid disease   . Tobacco abuse    Quit 2014    Past Surgical History:  Procedure Laterality Date  . BRAIN SURGERY   2015   hematoma evacuation  . BURR HOLE Right 04/13/2014   Procedure: Haskell Flirt;  Surgeon: Charlie Pitter, MD;  Location: [place] NEURO ORS;  Service: Neurosurgery;  Laterality: Right;  . CAROTID ENDARTERECTOMY Right Feb. 25, 2010    CEA  . CORONARY ANGIOPLASTY WITH STENT PLACEMENT  12/03/2013   LAD 90%-->0% W/ Promus Premier DES 3.0 mm x 16 mm, CFX OK, RCA 40%, EF 70-75%  . LEFT ATRIAL APPENDAGE OCCLUSION N/A 08/05/2015   Procedure: LEFT ATRIAL APPENDAGE OCCLUSION;  Surgeon: [name], MD;  Location: [place] CV LAB;  Service: Cardiovascular;  Laterality: N/A;  . LEFT HEART CATHETERIZATION WITH CORONARY ANGIOGRAM Left 12/03/2013   Procedure: LEFT HEART CATHETERIZATION WITH CORONARY ANGIOGRAM;  Surgeon: Leonie Man, MD;  Location: [facility] CATH LAB;  Service: Cardiovascular;  Laterality: Left;  . LEFT HEART CATHETERIZATION WITH CORONARY ANGIOGRAM N/A 01/26/2014   Procedure: LEFT HEART CATHETERIZATION WITH CORONARY ANGIOGRAM;  Surgeon: Jettie Booze, MD;  Location: [facility] CATH LAB;  Service: Cardiovascular;  Laterality: N/A;  . LEFT HEART CATHETERIZATION WITH CORONARY ANGIOGRAM N/A 08/03/2014   Procedure: LEFT HEART CATHETERIZATION WITH CORONARY ANGIOGRAM;  Surgeon: Burnell Blanks, MD;  Location[facility] CATH LAB;  Service: Cardiovascular;  Laterality: N/A;  . PERCUTANEOUS CORONARY STENT INTERVENTION (PCI-S)  12/03/2013   Procedure: PERCUTANEOUS CORONARY STENT INTERVENTION (PCI-S);  Surgeon: Leonie Man, MD;  Location: [facility]  CATH LAB;  Service: Cardiovascular;;  . PERMANENT PACEMAKER INSERTION N/A 09/18/2014   Procedure: PERMANENT PACEMAKER INSERTION;  Surgeon: [name], MD;  Location: [facility] CATH LAB;  Service: Cardiovascular;  Laterality: N/A;  . RADIOFREQUENCY ABLATION  2005   for PSVT  . TEE WITHOUT CARDIOVERSION N/A 07/27/2015   Procedure: TRANSESOPHAGEAL ECHOCARDIOGRAM (TEE);  Surgeon: Lelon Perla, MD;  Location: [facility] ENDOSCOPY;  Service: Cardiovascular;  Laterality: N/A;  . TEE WITHOUT  CARDIOVERSION N/A 09/15/2015   Procedure: TRANSESOPHAGEAL ECHOCARDIOGRAM (TEE);  Surgeon: Thayer Headings, MD;  Location: [place];  Service: Cardiovascular;  Laterality: N/A;    There were no vitals filed for this visit.       Subjective Assessment - 03/29/16 0911    Subjective Peter Dunlap states that he has had two doctors  state that he needs a total knee but he has a bad heart therefore he can not have the operation.   His knee has been increasing in pain for the past five months.  At this time he has both a rollator and a cane to ambulate.  He uses each device equally depending on how his knee is feeling.    Pertinent History HTN, DM, T/C NO MO adenocarcinoma of the prostate with PSA at 9.09, pacemaker, past back surgery    Limitations Standing;Walking;House hold activities;Sitting   How long can you sit comfortably? 10 minutes then he feels like he needs to stretch his leg out.    How long can you stand comfortably? 5-10 minutes ; also due to lumbar disc issues.   How long can you walk comfortably? With assistive device less than five minutes.   Patient Stated Goals To be able to drive with his right leg only    Currently in Pain? Yes   Pain Score 4   worst pain 10/10;    Pain Location Knee   Pain Orientation Right   Pain Descriptors / Indicators Aching;Throbbing   Pain Type Chronic pain   Pain Onset More than a month ago   Pain Frequency Constant   Aggravating Factors  weight bearing    Pain Relieving Factors ice    Effect of Pain on Daily Activities increases            [facility] PT Assessment - 03/29/16 0001      Assessment   Medical Diagnosis Rt knee pain/ Gt instability   Referring Provider Dr. Vic Dunlap   Onset Date/Surgical Date 09/04/15   Next MD Visit not scheduled    Prior Therapy none     Precautions   Precautions None     Restrictions   Weight Bearing Restrictions No     Balance Screen   Has the patient fallen in the past 6 months Yes   How  many times? 3   Has the patient had a decrease in activity level because of a fear of falling?  Yes   Is the patient reluctant to leave their home because of a fear of falling?  No     Home Environment   Living Environment Private residence   Type of Horse Shoe Access Stairs to enter   Entrance Stairs-Number of Steps 8     Prior Function   Level of Independence Independent with community mobility with device     Cognition   Overall Cognitive Status Within Functional Limits for tasks assessed     Functional Tests   Functional tests Single leg stance;Sit to Stand  Single Leg Stance   Comments Lt Le 2 seconds; Rt 0      Sit to Stand   Comments unable to stand without using his hands      ROM / Strength   AROM / PROM / Strength AROM;Strength     AROM   AROM Assessment Site Knee   Right/Left Knee Right   Right Knee Extension 6   Right Knee Flexion 110     Strength   Strength Assessment Site Hip;Knee;Ankle   Right/Left Hip Right;Left   Right Hip Flexion 3+/5   Right Hip Extension 2+/5   Right Hip ABduction 4-/5   Left Hip ABduction 5/5   Right/Left Knee Right;Left   Right Knee Extension 3/5   Left Knee Extension 5/5   Right/Left Ankle Right;Left   Right Ankle Dorsiflexion 3+/5   Left Ankle Dorsiflexion 5/5                   [facility] Adult PT Treatment/Exercise - 03/29/16 0001      Exercises   Exercises Knee/Hip     Knee/Hip Exercises: Seated   Long Arc Quad 5 reps   Other Seated Knee/Hip Exercises T-band dorsiflexion x 5      Knee/Hip Exercises: Supine   Quad Sets Both;10 reps   Heel Slides 10 reps   Bridges 5 reps   Straight Leg Raises 5 reps                PT Education - 03/29/16 1332    Education provided Yes   Education Details HEP   Person(s) Educated Patient   Methods Explanation;Handout;Demonstration   Comprehension Verbalized understanding;Returned demonstration          PT Short Term Goals - 03/29/16 1340       PT SHORT TERM GOAL #1   Title Pt to be able to come sit to stand without the use of his hands    Time 4   Period Weeks   Status New     PT SHORT TERM GOAL #2   Title Pt Rt knee pain to be no greater than a 6/10 to allow pt to ambulate with an assisitve device for up to 15 minutes for improved community ambulation.    Time 4   Period Weeks   Status New     PT SHORT TERM GOAL #3   Title Pt to be able to sit for thirty minutes in comfort to be able to enjoy a meal    Time 4   Period Weeks           PT Long Term Goals - 03/29/16 1342      PT LONG TERM GOAL #1   Title Pt strength in right LE to be at least a 4/5 to allow pt to go up and down eight steps in a reciprocal mannner    Time 8   Period Weeks   Status New     PT LONG TERM GOAL #2   Title Pt  ROM  of his RT knee to be at 125 to be able to sit in a car for up to two hours for traveling    Time 8   Period Weeks   Status New     PT LONG TERM GOAL #3   Title Pt pain in his right knee to be no greater than a 4/10 to allow pt to ambulate with a cane for up to 30 minutes to be able to complete short  shopping sprees.    Time 8   Period Weeks   Status New               Plan - 2016-04-04 1334    Clinical Impression Statement Mr. Orndorff is a 60 yo male who has been referred to skilled physical therapy for Rt knee pain as well as unsteady gait.  He has been told that he needs a total knee replacement but is unable to have the surgery due to heart issues.  He has fallen three times in the past six months.  Examination demonstrates decreased balance, power, strength and ROM.  Mr. Rushlow will benefit from skilled physical therapy to help address his deficits and maximize his functioning ability.  Due to pt starting radiation therapy which will be five times a week we will see Mr. Cancellieri one time a week for the next eight weeks for HEP.     Rehab Potential Fair   PT Frequency 1x / week   PT Duration 8 weeks   PT  Treatment/Interventions ADLs/Self Care Home Management;Gait training;Therapeutic activities;Therapeutic exercise;Balance training;Patient/family education   PT Next Visit Plan Pt will need HEP for new exercises:  heel raise, functional squat, sidelying hip abduction and SAQ progress to tandem stance and step ups    PT Home Exercise Plan resisted dorsiflexion, LAQ, Q-set, heelslides, SLR and bridging.       Patient will benefit from skilled therapeutic intervention in order to improve the following deficits and impairments:  Decreased activity tolerance, Decreased balance, Decreased range of motion, Decreased strength, Difficulty walking, Pain, Obesity, Increased edema  Visit Diagnosis: Chronic pain of right knee  Difficulty in walking, not elsewhere classified  Unsteadiness on feet  History of falling  Muscle weakness (generalized)      G-Codes - 04-04-2016 1346    Functional Assessment Tool Used foto   Functional Limitation Mobility: Walking and moving around   Mobility: Walking and Moving Around Current Status [phone]) At least 80 percent but less than 100 percent impaired, limited or restricted   Mobility: Walking and Moving Around Goal Status [phone]) At least 40 percent but less than 60 percent impaired, limited or restricted       Problem List Patient Active Problem List   Diagnosis Date Noted  . Prostate cancer ([place]) 03/10/2016  . Right-sided extracranial carotid artery stenosis 11/25/2015  . Former smoker 11/25/2015  . History of right-sided carotid endarterectomy 11/25/2015  . Encounter for postoperative carotid endarterectomy surveillance 11/25/2015  . Paroxysmal atrial fibrillation ([facility])   . Persistent atrial fibrillation ([place]) 08/05/2015  . Pacemaker-St Jude 09/18/2014  . Tachy-brady syndrome ([place]) 08/19/2014  . Chest pain 08/04/2014  . Atrial flutter with rapid ventricular response ([place])   . Atrial fibrillation ([place]) 07/31/2014  . Chronic insomnia 07/24/2014  .  OA (osteoarthritis) of knee 05/26/2014  . S/P subdural hematoma evacuation-Nov 2015 04/10/2014  . 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[place]) 04/02/2014  . Chronic diastolic heart failure ([place]) 03/04/2014  . 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 Gout 02/17/2014  . OSA (obstructive sleep apnea) 02/17/2014  . Obesity (BMI 30-39.9) 12/04/2013  . Diabetes mellitus type 2 in obese ([place]) 12/03/2013  . Aftercare following surgery of the circulatory system, [place] 11/13/2013  . Hypertrophic cardiomyopathy ([place]) 09/07/2011  . Hypertension   . CAD- LAD DES 12/03/13, patent Feb 2016   . Cerebrovascular disease   . Hyperlipidemia   . ERECTILE DYSFUNCTION, ORGANIC 08/11/2010  . PSVT (paroxysmal supraventricular tachycardia) ([place]) 06/03/2008  . PERIPHERAL  NEUROPATHY 01/28/2008  . BPH (benign prostatic hypertrophy) 05/11/2006  . GERD 04/23/2006  . BACK PAIN 04/23/2006    Rayetta Humphrey, PT CLT [phone] 03/29/2016, 1:48 PM  [facility] [address] [place], [place], [address] Phone: [phone]   Fax:  [phone]  Name: CHRISTI LEVENTRY MRN: [id number] Date of Birth: 1956-02-16

## 2016-03-31 ENCOUNTER — Ambulatory Visit
Admission: RE | Admit: 2016-03-31 | Discharge: 2016-03-31 | Disposition: A | Discharge status: Home / Self Care | Payer: Medicare Other | Source: Ambulatory Visit | Attending: Radiation Oncology | Admitting: Radiation Oncology

## 2016-03-31 DIAGNOSIS — C61 Malignant neoplasm of prostate: Secondary | ICD-10-CM

## 2016-04-01 ENCOUNTER — Other Ambulatory Visit: Payer: Self-pay | Admitting: Family Medicine

## 2016-04-04 ENCOUNTER — Encounter (HOSPITAL_COMMUNITY): Payer: Self-pay

## 2016-04-04 ENCOUNTER — Ambulatory Visit (HOSPITAL_COMMUNITY): Admission: RE | Admit: 2016-04-04 | Payer: Medicare Other | Source: Ambulatory Visit

## 2016-04-04 ENCOUNTER — Ambulatory Visit (HOSPITAL_COMMUNITY)
Admission: RE | Admit: 2016-04-04 | Discharge: 2016-04-04 | Disposition: A | Discharge status: Home / Self Care | Payer: Medicare Other | Source: Ambulatory Visit | Attending: Urology | Admitting: Urology

## 2016-04-04 DIAGNOSIS — C61 Malignant neoplasm of prostate: Secondary | ICD-10-CM | POA: Diagnosis not present

## 2016-04-04 MED ORDER — GENTAMICIN SULFATE 40 MG/ML IJ SOLN
INTRAMUSCULAR | Status: AC
  Administered 2016-04-04: 160 mg via INTRAMUSCULAR
  Filled 2016-04-04: qty 4

## 2016-04-04 MED ORDER — LIDOCAINE HCL (PF) 2 % IJ SOLN
INTRAMUSCULAR | Status: AC
  Administered 2016-04-04: 10 mL
  Filled 2016-04-04: qty 10

## 2016-04-04 MED ORDER — LIDOCAINE HCL (PF) 2 % IJ SOLN
INTRAMUSCULAR | Status: AC
  Filled 2016-04-04: qty 10

## 2016-04-04 MED ORDER — LIDOCAINE HCL (PF) 2 % IJ SOLN
10.0000 mL | Freq: Once | INTRAMUSCULAR | Status: AC
  Administered 2016-04-04: 10 mL

## 2016-04-04 MED ORDER — GENTAMICIN SULFATE 40 MG/ML IJ SOLN
160.0000 mg | Freq: Once | INTRAMUSCULAR | Status: AC
  Administered 2016-04-04: 160 mg via INTRAMUSCULAR

## 2016-04-04 MED ORDER — GENTAMICIN SULFATE 40 MG/ML IJ SOLN
INTRAMUSCULAR | Status: AC
  Filled 2016-04-04: qty 2

## 2016-04-05 ENCOUNTER — Other Ambulatory Visit: Payer: Self-pay

## 2016-04-05 ENCOUNTER — Telehealth (HOSPITAL_COMMUNITY): Payer: Self-pay | Admitting: Family Medicine

## 2016-04-05 ENCOUNTER — Ambulatory Visit (HOSPITAL_COMMUNITY): Payer: Medicare Other | Admitting: Physical Therapy

## 2016-04-05 NOTE — Telephone Encounter (Signed)
04/05/16 caller just said that he couldn't come in today

## 2016-04-05 NOTE — Patient Outreach (Signed)
[facility]) Care Management  [place]  04/05/2016   Peter Dunlap 03-27-1956 [id number]  Subjective: Telephone call to patient for monthly call.  Patient reports he is doing pretty good. He reports he had seeds implanted on yesterday and he goes for radiation markings for his prostate cancer.  Patient also reports he is doing therapy for his right knee. He denies pain today. Patient reports sugars are about the same.  Discussed with patient importance of diet to maintain sugars but also maintaining balanced diet as he goes through cancer treatments.  He verbalized understanding.   Objective:   Encounter Medications:  Outpatient Encounter Prescriptions as of 04/05/2016  Medication Sig  . allopurinol (ZYLOPRIM) 100 MG tablet TAKE 1 TABLET BY MOUTH EVERY DAY  . aspirin EC 325 MG EC tablet Take 1 tablet (325 mg total) by mouth daily.  [name] DEXILANT 60 MG capsule TAKE 1 CAPSULE BY MOUTH EVERY DAY  . furosemide (LASIX) 20 MG tablet TAKE 3 TABLETS BY MOUTH DAILY  . isosorbide mononitrate (IMDUR) 60 MG 24 hr tablet TAKE 1 TABLET BY MOUTH EVERY DAY  . levothyroxine (SYNTHROID, LEVOTHROID) 200 MCG tablet Take 1 tablet (200 mcg total) by mouth daily before breakfast.  . Magnesium Oxide 400 MG CAPS Take 1 capsule (400 mg total) by mouth 2 (two) times daily.  . metFORMIN (GLUCOPHAGE) 500 MG tablet TAKE 1 TABLET(500 MG) BY MOUTH TWICE DAILY WITH A MEAL  . metoprolol tartrate (LOPRESSOR) 25 MG tablet TAKE 1 TABLET(25 MG) BY MOUTH TWICE DAILY  . nitroGLYCERIN (NITROSTAT) 0.4 MG SL tablet Place 0.4 mg under the tongue every 5 (five) minutes as needed for chest pain (x 3 doses).  . nitroGLYCERIN (NITROSTAT) 0.4 MG SL tablet PLACE 1 TABLET UNDER THE TONGUE EVERY 5 MINUTES AS NEEDED FOR CHEST PAIN  . ONE TOUCH ULTRA TEST test strip USE TO TEST ONCE DAILY  . oxyCODONE-acetaminophen (ROXICET) 5-325 MG tablet Take 1 tablet by mouth every 6 (six) hours as needed for severe pain.  .  potassium chloride SA (K-DUR,KLOR-CON) 20 MEQ tablet TAKE 1 TABLET BY MOUTH EVERY DAY  . RAPAFLO 8 MG CAPS capsule TAKE 1 CAPSULE(8 MG) BY MOUTH DAILY  . simvastatin (ZOCOR) 40 MG tablet TAKE 1 TABLET(40 MG) BY MOUTH DAILY  . sotalol (BETAPACE) 120 MG tablet Take 1 tablet (120 mg total) by mouth 2 (two) times daily.  . colchicine 0.6 MG tablet Take 1 tablet (0.6 mg total) by mouth daily. (Patient not taking: Reported on 04/05/2016)  . traZODone (DESYREL) 50 MG tablet TAKE 1/2 TO 1 TABLET BY MOUTH AT BEDTIME AS NEEDED FOR SLEEP (Patient not taking: Reported on 04/05/2016)   Facility-Administered Encounter Medications as of 04/05/2016  Medication  . 0.9 %  sodium chloride infusion    Functional Status:  In your present state of health, do you have any difficulty performing the following activities: 08/05/2015 08/05/2015  Hearing? - N  Vision? - N  Difficulty concentrating or making decisions? - N  Walking or climbing stairs? - N  Dressing or bathing? - N  Doing errands, shopping? N -  Preparing Food and eating ? - -  Using the Toilet? - -  In the past six months, have you accidently leaked urine? - -  Do you have problems with loss of bowel control? - -  Managing your Medications? - -  Managing your Finances? - -  Housekeeping or managing your Housekeeping? - -  Some recent data might be hidden  Fall/Depression Screening: PHQ 2/9 Scores 04/05/2016 03/06/2016 03/01/2016 02/04/2016 01/04/2016 12/09/2015 11/11/2015  PHQ - 2 Score 0 0 0 0 0 0 0  PHQ- 9 Score - - - - - - -    Assessment: Patient continues to benefit from health coach outreach for disease management and support.    Plan:  [facility] CM Care Plan Problem One   Flowsheet Row Most Recent Value  Care Plan Problem One  Diabetes Knowledge deficit  Role Documenting the Problem One  Health Coach  Care Plan for Problem One  Active  [facility] Long Term Goal (31-90 days)  Patient will verbalize care for diabetes such as diet  and blood sugar monitoring  within the next 90 days.  [facility] Long Term Goal Start Date  04/05/16  Interventions for Problem One [place] reviewed with patient foods high in carbohydrates and reasons to monitor blood sugar regularly.       RN Health Coach will contact patient in the month of December and patient agrees to next outreach.  [name], RN, MSN [place] [phone]

## 2016-04-05 NOTE — Progress Notes (Signed)
  Radiation Oncology         [phone] ________________________________  Name: Peter Dunlap MRN: [id number]  Date: 04/06/2016  DOB: Jul 22, 1955  SIMULATION AND TREATMENT PLANNING NOTE    ICD-9-CM ICD-10-CM   1. Prostate cancer ([place]) 185 C61     DIAGNOSIS:  60 yo gentleman with stage T1c N0 M0 adenocarcinoma of the prostate with a Gleason's Score of 4+3 and a PSA of 9.09  NARRATIVE:  The patient was brought to the [facility].  Identity was confirmed.  All relevant records and images related to the planned course of therapy were reviewed.  The patient freely provided informed written consent to proceed with treatment after reviewing the details related to the planned course of therapy. The consent form was witnessed and verified by the simulation staff.  Then, the patient was set-up in a stable reproducible supine position for radiation therapy.  A vacuum lock pillow device was custom fabricated to position his legs in a reproducible immobilized position.  Then, I performed a urethrogram under sterile conditions to identify the prostatic apex.  CT images were obtained.  Surface markings were placed.  The CT images were loaded into the planning software.  Then the prostate target and avoidance structures including the rectum, bladder, bowel and hips were contoured.  Treatment planning then occurred.  The radiation prescription was entered and confirmed.  A total of 1 complex treatment devices were fabricated. I have requested : Intensity Modulated Radiotherapy (IMRT) is medically necessary for this case for the following reason:  Rectal sparing.[name]  PLAN:  The patient will receive 78 Gy in 40 fractions.  ________________________________  Sheral Apley Tammi Klippel, M.D.

## 2016-04-06 ENCOUNTER — Ambulatory Visit
Admission: RE | Admit: 2016-04-06 | Discharge: 2016-04-06 | Disposition: A | Discharge status: Home / Self Care | Payer: Medicare Other | Source: Ambulatory Visit | Attending: Radiation Oncology | Admitting: Radiation Oncology

## 2016-04-06 DIAGNOSIS — C61 Malignant neoplasm of prostate: Secondary | ICD-10-CM | POA: Diagnosis not present

## 2016-04-06 DIAGNOSIS — I2510 Atherosclerotic heart disease of native coronary artery without angina pectoris: Secondary | ICD-10-CM | POA: Diagnosis not present

## 2016-04-06 DIAGNOSIS — Z950 Presence of cardiac pacemaker: Secondary | ICD-10-CM | POA: Diagnosis not present

## 2016-04-06 DIAGNOSIS — I495 Sick sinus syndrome: Secondary | ICD-10-CM | POA: Diagnosis not present

## 2016-04-06 DIAGNOSIS — I10 Essential (primary) hypertension: Secondary | ICD-10-CM | POA: Diagnosis not present

## 2016-04-06 DIAGNOSIS — Z720 Tobacco use: Secondary | ICD-10-CM | POA: Diagnosis not present

## 2016-04-06 DIAGNOSIS — Z510 Encounter for antineoplastic radiation therapy: Secondary | ICD-10-CM | POA: Diagnosis not present

## 2016-04-06 DIAGNOSIS — Z9889 Other specified postprocedural states: Secondary | ICD-10-CM | POA: Diagnosis not present

## 2016-04-06 DIAGNOSIS — K219 Gastro-esophageal reflux disease without esophagitis: Secondary | ICD-10-CM | POA: Diagnosis not present

## 2016-04-06 DIAGNOSIS — E785 Hyperlipidemia, unspecified: Secondary | ICD-10-CM | POA: Diagnosis not present

## 2016-04-06 DIAGNOSIS — I252 Old myocardial infarction: Secondary | ICD-10-CM | POA: Diagnosis not present

## 2016-04-06 DIAGNOSIS — E079 Disorder of thyroid, unspecified: Secondary | ICD-10-CM | POA: Diagnosis not present

## 2016-04-06 DIAGNOSIS — Z87891 Personal history of nicotine dependence: Secondary | ICD-10-CM | POA: Diagnosis not present

## 2016-04-10 ENCOUNTER — Other Ambulatory Visit: Payer: Self-pay | Admitting: Family Medicine

## 2016-04-10 ENCOUNTER — Telehealth: Payer: Self-pay | Admitting: Family Medicine

## 2016-04-10 MED ORDER — OXYCODONE-ACETAMINOPHEN 5-325 MG PO TABS: 1.0000 | ORAL_TABLET | Freq: Four times a day (QID) | ORAL | 0 refills | Status: DC | PRN

## 2016-04-10 NOTE — Telephone Encounter (Signed)
Patient needs rx for his oxycodone  [phone] (H)

## 2016-04-10 NOTE — Telephone Encounter (Signed)
Left pt message refill ready this afternoon

## 2016-04-10 NOTE — Telephone Encounter (Signed)
LRF 03/10/16 #60  LOV 03/10/16  OK refill?

## 2016-04-10 NOTE — Telephone Encounter (Signed)
Duplicate request

## 2016-04-10 NOTE — Telephone Encounter (Signed)
Okay to refill? 

## 2016-04-12 ENCOUNTER — Other Ambulatory Visit: Payer: Self-pay | Admitting: *Deleted

## 2016-04-12 ENCOUNTER — Ambulatory Visit (HOSPITAL_COMMUNITY): Payer: Medicare Other | Attending: Family Medicine | Admitting: Physical Therapy

## 2016-04-12 DIAGNOSIS — M25561 Pain in right knee: Secondary | ICD-10-CM | POA: Insufficient documentation

## 2016-04-12 DIAGNOSIS — R262 Difficulty in walking, not elsewhere classified: Secondary | ICD-10-CM | POA: Diagnosis not present

## 2016-04-12 DIAGNOSIS — I6523 Occlusion and stenosis of bilateral carotid arteries: Secondary | ICD-10-CM

## 2016-04-12 DIAGNOSIS — Z9181 History of falling: Secondary | ICD-10-CM | POA: Insufficient documentation

## 2016-04-12 DIAGNOSIS — G8929 Other chronic pain: Secondary | ICD-10-CM | POA: Diagnosis not present

## 2016-04-12 DIAGNOSIS — M6281 Muscle weakness (generalized): Secondary | ICD-10-CM | POA: Diagnosis present

## 2016-04-12 DIAGNOSIS — R2681 Unsteadiness on feet: Secondary | ICD-10-CM | POA: Diagnosis not present

## 2016-04-12 NOTE — Therapy (Addendum)
[place], [place], [address] Phone: [phone]   Fax:  [phone]  Physical Therapy Treatment  Patient Details  Name: Peter Dunlap MRN: [id number] Date of Birth: April 19, 1956 Referring Provider: Dr. [name]  Encounter Date: 04/12/2016      PT End of Session - 04/12/16 1014    Visit Number 2   Number of Visits 8   Date for PT Re-Evaluation 04/28/16   Authorization Type [employer] medicare   Authorization - Visit Number 2   Authorization - Number of Visits 8   PT Start Time 0945   PT Stop Time 1024   PT Time Calculation (min) 39 min   Activity Tolerance Patient tolerated treatment well   Behavior During Therapy [facility] for tasks assessed/performed      Past Medical History:  Diagnosis Date  . Arteriosclerotic cardiovascular disease (ASCVD) 2005   catheterization in 10/2010:50% mid LAD, diffuse distal disease, circumflex irregularities, large dominant RCA with a 50% ostial, 70% distal, 60% posterolateral and 70% PDA; normal EF  . Arthritis   . Benign prostatic hypertrophy   . Cerebrovascular disease 2010   R. carotid endarterectomy; Duplex in 10/2010-widely patent ICAs, subtotal left vertebral-not thought to be contributing to symptoms  . Cervical spine disease    CT in 2012-advanced degeneration and spondylosis with moderate spinal stenosis at C3-C6  . Depression   . Erectile dysfunction   . Gastroesophageal reflux disease   . H/O hiatal hernia   . H/O: substance abuse    Cocaine, marijuana, alcohol.  Quit 2013.   [name] Hyperlipidemia   . Hypertension   . Non-ST elevation myocardial infarction (NSTEMI), initial episode of care [facility]) 12/02/2013   DES LAD  . Obesity   . Prostate cancer ([place])   . Sleep apnea    CPAP  . Tachy-brady syndrome ([place])    a. s/p STJ dual chamber PPM   . Thyroid disease   . Tobacco abuse    Quit 2014    Past Surgical History:  Procedure Laterality Date  . BRAIN SURGERY   2015   hematoma evacuation  . BURR HOLE Right 04/13/2014   Procedure: Haskell Flirt;  Surgeon: Charlie Pitter, MD;  Location: [place] NEURO ORS;  Service: Neurosurgery;  Laterality: Right;  . CAROTID ENDARTERECTOMY Right Feb. 25, 2010    CEA  . CORONARY ANGIOPLASTY WITH STENT PLACEMENT  12/03/2013   LAD 90%-->0% W/ Promus Premier DES 3.0 mm x 16 mm, CFX OK, RCA 40%, EF 70-75%  . LEFT ATRIAL APPENDAGE OCCLUSION N/A 08/05/2015   Procedure: LEFT ATRIAL APPENDAGE OCCLUSION;  Surgeon: [name], MD;  Location: [place] CV LAB;  Service: Cardiovascular;  Laterality: N/A;  . LEFT HEART CATHETERIZATION WITH CORONARY ANGIOGRAM Left 12/03/2013   Procedure: LEFT HEART CATHETERIZATION WITH CORONARY ANGIOGRAM;  Surgeon: Leonie Man, MD;  Location: [facility] CATH LAB;  Service: Cardiovascular;  Laterality: Left;  . LEFT HEART CATHETERIZATION WITH CORONARY ANGIOGRAM N/A 01/26/2014   Procedure: LEFT HEART CATHETERIZATION WITH CORONARY ANGIOGRAM;  Surgeon: Jettie Booze, MD;  Location[facility] C[facility] LAB;  Service: Cardiovascular;  Laterality: N/A;  . LEFT HEART CATHETERIZATION WITH CORONARY ANGIOGRAM N/A 08/03/2014   Procedure: LEFT HEART CATHETERIZATION WITH CORONARY ANGIOGRAM;  Surgeon: Burnell Blanks, MD;  Location: [facility] CATH LAB;  Service: Cardiovascular;  Laterality: N/A;  . PERCUTANEOUS CORONARY STENT INTERVENTION (PCI-S)  12/03/2013   Procedure: PERCUTANEOUS CORONARY STENT INTERVENTION (PCI-S);  Surgeon: Leonie Man, MD;  Location: [facility]  CATH LAB;  Service: Cardiovascular;;  . PERMANENT PACEMAKER INSERTION N/A 09/18/2014   Procedure: PERMANENT PACEMAKER INSERTION;  Surgeon: [name], MD;  Location: [facility] CATH LAB;  Service: Cardiovascular;  Laterality: N/A;  . RADIOFREQUENCY ABLATION  2005   for PSVT  . TEE WITHOUT CARDIOVERSION N/A 07/27/2015   Procedure: TRANSESOPHAGEAL ECHOCARDIOGRAM (TEE);  Surgeon: Lelon Perla, MD;  Location: [facility] ENDOSCOPY;  Service: Cardiovascular;  Laterality: N/A;  . TEE WITHOUT  CARDIOVERSION N/A 09/15/2015   Procedure: TRANSESOPHAGEAL ECHOCARDIOGRAM (TEE);  Surgeon: Thayer Headings, MD;  Location: [place];  Service: Cardiovascular;  Laterality: N/A;    There were no vitals filed for this visit.      Subjective Assessment - 04/12/16 0957    Subjective Pt states that he is doing better.      Pertinent History HTN, DM, T/C NO MO adenocarcinoma of the prostate with PSA at 9.09, pacemaker, past back surgery    Limitations Standing;Walking;House hold activities;Sitting   How long can you sit comfortably? 10 minutes then he feels like he needs to stretch his leg out.    How long can you stand comfortably? 5-10 minutes ; also due to lumbar disc issues.   How long can you walk comfortably? With assistive device less than five minutes.   Patient Stated Goals To be able to drive with his right leg only    Currently in Pain? Yes   Pain Score 2   increases to a 5 with activity    Pain Location Knee   Pain Orientation Right   Pain Descriptors / Indicators Aching   Pain Type Chronic pain   Pain Onset More than a month ago   Pain Frequency Constant                         [facility] Adult PT Treatment/Exercise - 04/12/16 0001      Exercises   Exercises Knee/Hip     Knee/Hip Exercises: Stretches   Active Hamstring Stretch Right;3 reps;30 seconds     Knee/Hip Exercises: Standing   Heel Raises Both;10 reps   Functional Squat 10 reps   Functional Squat Limitations at wall going down 1 " only    Other Standing Knee Exercises narrow base with head turns; partial tandem stance.       Knee/Hip Exercises: Seated   Long Arc Quad Right;10 reps   Long Arc Quad Weight 3 lbs.   Other Seated Knee/Hip Exercises --     Knee/Hip Exercises: Supine   Quad Sets Both;10 reps   Heel Slides Right;5 reps   Terminal Knee Extension Strengthening;Right;10 reps   Terminal Knee Extension Limitations 3#, then 10 with 5#   Bridges 10 reps   Straight Leg Raises Right;10  reps     Knee/Hip Exercises: Sidelying   Hip ABduction Right;10 reps                PT Education - 04/12/16 1014    Education provided Yes   Education Details New HEP;     Person(s) Educated Patient   Methods Explanation;Demonstration;Verbal cues;Handout   Comprehension Verbalized understanding;Returned demonstration          PT Short Term Goals - 04/12/16 1028      PT SHORT TERM GOAL #1   Title Pt to be able to come sit to stand without the use of his hands    Time 4   Period Weeks   Status On-going  PT SHORT TERM GOAL #2   Title Pt Rt knee pain to be no greater than a 6/10 to allow pt to ambulate with an assisitve device for up to 15 minutes for improved community ambulation.    Time 4   Period Weeks   Status On-going     PT SHORT TERM GOAL #3   Title Pt to be able to sit for thirty minutes in comfort to be able to enjoy a meal    Time 4   Period Weeks   Status On-going           PT Long Term Goals - 04/12/16 1028      PT LONG TERM GOAL #1   Title Pt strength in right LE to be at least a 4/5 to allow pt to go up and down eight steps in a reciprocal mannner    Time 8   Period Weeks   Status On-going     PT LONG TERM GOAL #2   Title Pt  ROM  of his RT knee to be at 125 to be able to sit in a car for up to two hours for traveling    Time 8   Period Weeks   Status On-going     PT LONG TERM GOAL #3   Title Pt pain in his right knee to be no greater than a 4/10 to allow pt to ambulate with a cane for up to 30 minutes to be able to complete short shopping sprees.    Time 8   Period Weeks   Status On-going               Plan - 04/12/16 1026    Clinical Impression Statement PT improved with HEP.  Tolerated new exercises with modification needed for standing squats to remain pain free.     Rehab Potential Fair   PT Frequency 1x / week   PT Duration 8 weeks   PT Treatment/Interventions ADLs/Self Care Home Management;Gait  training;Therapeutic activities;Therapeutic exercise;Balance training;Patient/family education   PT Next Visit Plan  progress to  step ups if pt can stay pain free begin with 2" step up    PT Home Exercise Plan resisted dorsiflexion, LAQ, Q-set, heelslides, SLR and bridging. hamstring stretch, heel raise and SAQ       Patient will benefit from skilled therapeutic intervention in order to improve the following deficits and impairments:  Decreased activity tolerance, Decreased balance, Decreased range of motion, Decreased strength, Difficulty walking, Pain, Obesity, Increased edema  Visit Diagnosis: Chronic pain of right knee  Difficulty in walking, not elsewhere classified  Unsteadiness on feet  History of falling  Muscle weakness (generalized)   G8979 CK G8980  CM   Problem List Patient Active Problem List   Diagnosis Date Noted  . Prostate cancer ([place]) 03/10/2016  . Right-sided extracranial carotid artery stenosis 11/25/2015  . Former smoker 11/25/2015  . History of right-sided carotid endarterectomy 11/25/2015  . Encounter for postoperative carotid endarterectomy surveillance 11/25/2015  . Paroxysmal atrial fibrillation ([facility])   . Persistent atrial fibrillation ([place]) 08/05/2015  . Pacemaker-St Jude 09/18/2014  . Tachy-brady syndrome ([place]) 08/19/2014  . Chest pain 08/04/2014  . Atrial flutter with rapid ventricular response ([place])   . Atrial fibrillation ([place]) 07/31/2014  . Chronic insomnia 07/24/2014  . OA (osteoarthritis) of knee 05/26/2014  . S/P subdural hematoma evacuation-Nov 2015 04/10/2014  . 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[place]) 04/02/2014  . Chronic diastolic heart failure ([place]) 03/04/2014  . Hypothyroidism  following radioiodine therapy 02/17/2014  . Gout 02/17/2014  . OSA (obstructive sleep apnea) 02/17/2014  . Obesity (BMI 30-39.9) 12/04/2013  . Diabetes mellitus type 2 in obese ([place]) 12/03/2013  . Aftercare following surgery of the  circulatory system, [place] 11/13/2013  . Hypertrophic cardiomyopathy ([place]) 09/07/2011  . Hypertension   . CAD- LAD DES 12/03/13, patent Feb 2016   . Cerebrovascular disease   . Hyperlipidemia   . ERECTILE DYSFUNCTION, ORGANIC 08/11/2010  . PSVT (paroxysmal supraventricular tachycardia) ([place]) 06/03/2008  . PERIPHERAL NEUROPATHY 01/28/2008  . BPH (benign prostatic hypertrophy) 05/11/2006  . GERD 04/23/2006  . BACK PAIN 04/23/2006    Rayetta Humphrey, PT CLT [phone] 04/12/2016, 10:29 AM  [place] [address] [place], [place], [address] Phone: [phone]   Fax:  [phone]  Name: Peter Dunlap MRN: [id number] Date of Birth: Feb 26, 1956 PHYSICAL THERAPY DISCHARGE SUMMARY  Visits from Start of Care: 2  Current functional level related to goals / functional outcomes: No goals achieved as pt is to ill to participate in therapy    Remaining deficits: See above    Education / Equipment: HEP Plan: Patient agrees to discharge.  Patient goals were not met. Patient is being discharged due to a change in medical status.  ?????        Unable to participate in therapy.  Rayetta Humphrey, [place] CLT [phone]

## 2016-04-12 NOTE — Patient Instructions (Addendum)
Heel Raise: Bilateral (Standing)   At kitchen counter, hands holding on Rise on balls of feet. Repeat __0__ times per set. Do _1___ sets per session. Do 2____ sessions per day.  http://orth.exer.us/38   Copyright  VHI. All rights reserved.  Strengthening: Terminal Knee Extension (Supine)    With right knee over bolster, straighten knee by tightening muscles on top of thigh. Keep bottom of knee on bolster.  Use 3# then repeat with 5 #  Repeat _10___ times per set. Do ___1_ sets per session. Do _2___ sessions per day.  http://orth.exer.us/626   Copyright  VHI. All rights reserved.  Stretching: Hamstring (Supine)    Supporting right thigh behind knee, slowly straighten knee until stretch is felt in back of thigh. Hold __30__ seconds. Repeat _3___ times per set. Do ___1_ sets per session. Do ___2_ sessions per day.  http://orth.exer.us/656   Copyright  VHI. All rights reserved.

## 2016-04-13 DIAGNOSIS — Z950 Presence of cardiac pacemaker: Secondary | ICD-10-CM | POA: Diagnosis not present

## 2016-04-13 DIAGNOSIS — C61 Malignant neoplasm of prostate: Secondary | ICD-10-CM | POA: Diagnosis not present

## 2016-04-13 DIAGNOSIS — Z87891 Personal history of nicotine dependence: Secondary | ICD-10-CM | POA: Diagnosis not present

## 2016-04-13 DIAGNOSIS — I10 Essential (primary) hypertension: Secondary | ICD-10-CM | POA: Diagnosis not present

## 2016-04-13 DIAGNOSIS — E785 Hyperlipidemia, unspecified: Secondary | ICD-10-CM | POA: Diagnosis not present

## 2016-04-13 DIAGNOSIS — Z510 Encounter for antineoplastic radiation therapy: Secondary | ICD-10-CM | POA: Diagnosis not present

## 2016-04-13 DIAGNOSIS — K219 Gastro-esophageal reflux disease without esophagitis: Secondary | ICD-10-CM | POA: Diagnosis not present

## 2016-04-13 DIAGNOSIS — I252 Old myocardial infarction: Secondary | ICD-10-CM | POA: Diagnosis not present

## 2016-04-13 DIAGNOSIS — Z720 Tobacco use: Secondary | ICD-10-CM | POA: Diagnosis not present

## 2016-04-13 DIAGNOSIS — E079 Disorder of thyroid, unspecified: Secondary | ICD-10-CM | POA: Diagnosis not present

## 2016-04-13 DIAGNOSIS — I495 Sick sinus syndrome: Secondary | ICD-10-CM | POA: Diagnosis not present

## 2016-04-13 DIAGNOSIS — I2510 Atherosclerotic heart disease of native coronary artery without angina pectoris: Secondary | ICD-10-CM | POA: Diagnosis not present

## 2016-04-13 DIAGNOSIS — Z9889 Other specified postprocedural states: Secondary | ICD-10-CM | POA: Diagnosis not present

## 2016-04-17 ENCOUNTER — Ambulatory Visit
Admission: RE | Admit: 2016-04-17 | Discharge: 2016-04-17 | Disposition: A | Discharge status: Home / Self Care | Payer: Medicare Other | Source: Ambulatory Visit | Attending: Radiation Oncology | Admitting: Radiation Oncology

## 2016-04-17 ENCOUNTER — Telehealth: Payer: Self-pay | Admitting: Radiation Oncology

## 2016-04-17 ENCOUNTER — Encounter: Payer: Self-pay | Admitting: Radiation Oncology

## 2016-04-17 ENCOUNTER — Other Ambulatory Visit: Payer: Self-pay | Admitting: Nurse Practitioner

## 2016-04-17 ENCOUNTER — Other Ambulatory Visit: Payer: Self-pay | Admitting: Radiation Oncology

## 2016-04-17 VITALS — BP 123/72 | HR 76 | Temp 98.4°F | Resp 16 | Wt 306.0 lb

## 2016-04-17 DIAGNOSIS — Z87891 Personal history of nicotine dependence: Secondary | ICD-10-CM | POA: Diagnosis not present

## 2016-04-17 DIAGNOSIS — I209 Angina pectoris, unspecified: Secondary | ICD-10-CM

## 2016-04-17 DIAGNOSIS — R310 Gross hematuria: Secondary | ICD-10-CM

## 2016-04-17 DIAGNOSIS — Z510 Encounter for antineoplastic radiation therapy: Secondary | ICD-10-CM | POA: Diagnosis not present

## 2016-04-17 DIAGNOSIS — Z950 Presence of cardiac pacemaker: Secondary | ICD-10-CM | POA: Diagnosis not present

## 2016-04-17 DIAGNOSIS — M10071 Idiopathic gout, right ankle and foot: Secondary | ICD-10-CM

## 2016-04-17 DIAGNOSIS — K219 Gastro-esophageal reflux disease without esophagitis: Secondary | ICD-10-CM | POA: Diagnosis not present

## 2016-04-17 DIAGNOSIS — C61 Malignant neoplasm of prostate: Secondary | ICD-10-CM

## 2016-04-17 DIAGNOSIS — R300 Dysuria: Secondary | ICD-10-CM | POA: Diagnosis not present

## 2016-04-17 DIAGNOSIS — I2510 Atherosclerotic heart disease of native coronary artery without angina pectoris: Secondary | ICD-10-CM | POA: Diagnosis not present

## 2016-04-17 DIAGNOSIS — Z720 Tobacco use: Secondary | ICD-10-CM | POA: Diagnosis not present

## 2016-04-17 DIAGNOSIS — E079 Disorder of thyroid, unspecified: Secondary | ICD-10-CM | POA: Diagnosis not present

## 2016-04-17 DIAGNOSIS — Z9889 Other specified postprocedural states: Secondary | ICD-10-CM | POA: Diagnosis not present

## 2016-04-17 DIAGNOSIS — I10 Essential (primary) hypertension: Secondary | ICD-10-CM | POA: Diagnosis not present

## 2016-04-17 DIAGNOSIS — R0600 Dyspnea, unspecified: Secondary | ICD-10-CM

## 2016-04-17 DIAGNOSIS — I252 Old myocardial infarction: Secondary | ICD-10-CM | POA: Diagnosis not present

## 2016-04-17 DIAGNOSIS — E785 Hyperlipidemia, unspecified: Secondary | ICD-10-CM | POA: Diagnosis not present

## 2016-04-17 DIAGNOSIS — R634 Abnormal weight loss: Secondary | ICD-10-CM

## 2016-04-17 DIAGNOSIS — I495 Sick sinus syndrome: Secondary | ICD-10-CM | POA: Diagnosis not present

## 2016-04-17 LAB — URINALYSIS, MICROSCOPIC - CHCC
Glucose: NEGATIVE mg/dL
Protein: 300 mg/dL
Specific Gravity, Urine: 1.01 (ref 1.003–1.035)
Urobilinogen, UR: 0.2 mg/dL (ref 0.2–1)
pH: 8 (ref 4.6–8.0)

## 2016-04-17 LAB — COMPREHENSIVE METABOLIC PANEL
ALT: 16 U/L (ref 0–55)
AST: 20 U/L (ref 5–34)
Albumin: 3.2 g/dL — ABNORMAL LOW (ref 3.5–5.0)
Alkaline Phosphatase: 104 U/L (ref 40–150)
Anion Gap: 11 mEq/L (ref 3–11)
BUN: 13.6 mg/dL (ref 7.0–26.0)
CO2: 22 mEq/L (ref 22–29)
Calcium: 9.6 mg/dL (ref 8.4–10.4)
Chloride: 104 mEq/L (ref 98–109)
Creatinine: 1.2 mg/dL (ref 0.7–1.3)
EGFR: 73 mL/min/{1.73_m2} — ABNORMAL LOW (ref >90)
Glucose: 115 mg/dl (ref 70–140)
Potassium: 4.4 mEq/L (ref 3.5–5.1)
Sodium: 137 mEq/L (ref 136–145)
Total Bilirubin: 1.27 mg/dL — ABNORMAL HIGH (ref 0.20–1.20)
Total Protein: 7.9 g/dL (ref 6.4–8.3)

## 2016-04-17 LAB — CBC WITH DIFFERENTIAL/PLATELET
BASO%: 0.1 % (ref 0.0–2.0)
Basophils Absolute: 0.1 10*3/uL (ref 0.0–0.1)
EOS%: 0 % (ref 0.0–7.0)
Eosinophils Absolute: 0 10*3/uL (ref 0.0–0.5)
HCT: 46.1 % (ref 38.4–49.9)
HGB: 15.8 g/dL (ref 13.0–17.1)
LYMPH%: 7.3 % — ABNORMAL LOW (ref 14.0–49.0)
MCH: 32 pg (ref 27.2–33.4)
MCHC: 34.3 g/dL (ref 32.0–36.0)
MCV: 93.3 fL (ref 79.3–98.0)
MONO#: 2.7 10*3/uL — ABNORMAL HIGH (ref 0.1–0.9)
MONO%: 7.9 % (ref 0.0–14.0)
NEUT#: 28.8 10*3/uL — ABNORMAL HIGH (ref 1.5–6.5)
NEUT%: 84.7 % — ABNORMAL HIGH (ref 39.0–75.0)
Platelets: 231 10*3/uL (ref 140–400)
RBC: 4.94 10*6/uL (ref 4.20–5.82)
RDW: 14 % (ref 11.0–14.6)
WBC: 34.1 10*3/uL — ABNORMAL HIGH (ref 4.0–10.3)
lymph#: 2.5 10*3/uL (ref 0.9–3.3)

## 2016-04-17 LAB — TECHNOLOGIST REVIEW

## 2016-04-17 MED ORDER — SULFAMETHOXAZOLE-TRIMETHOPRIM 800-160 MG PO TABS: 1.0000 | ORAL_TABLET | Freq: Two times a day (BID) | ORAL | 0 refills | Status: DC

## 2016-04-17 NOTE — Progress Notes (Signed)
Vitals stable. Ten pound weight loss noted. Patient contributes weight loss to levothyroxine; explaining they have adjusted this 3 times in the last six months. Reports constant aching left testicular pain 9 on a scale of 0-10. Also, reports left groin pain that radiates around his left side. Reports taking a roxicet but, only received minimal relief. Reports pain began last night following a bowel movement that required a lot of straining. Patient had 3 gold markers placed approximately 2 weeks ago and took antibiotics the day before, day of and day after surgery. Reports urinary hesitancy. Reports pain with urination. Reports hematuria dark red with clots. Denies blood in stool. Reports he continues to take rapaflo as directed. Reports a similar situation happened earlier this year and he was found to have a UTI.  BP 123/72 (BP Location: Left Arm, Patient Position: Sitting, Cuff Size: Large)   Pulse 76   Temp 98.4 F (36.9 C) (Oral)   Resp 16   Wt (!) 306 lb (138.8 kg)   SpO2 100%   BMI 42.68 kg/m  Wt Readings from Last 3 Encounters:  04/17/16 (!) 306 lb (138.8 kg)  03/10/16 (!) 316 lb (143.3 kg)  03/01/16 (!) 316 lb 12.8 oz (143.7 kg)

## 2016-04-17 NOTE — Progress Notes (Signed)
Sam,  Again to summarize, this is our prostate cancer patient who's receiving radiotherapy to the prostate, who started having gross hematuria, a symptom not to be expected for his treatment. UA showed TNTC RBCs, and his CBC showed a white count of 34, 000. Now he is on Bactrim DS and we'll follow up on his urine culture. Can you let him know kidney function is stable, and he's not dehydrated. I'm not sure what to make of his hyperbilirubinemia, in September his gallbladder looked unremarkable on CT. I've copied Dr. Buelah Manis on this issue as well.   [employer]

## 2016-04-17 NOTE — Telephone Encounter (Signed)
Per PA, Peter Dunlap phoned patient. Spoke with his wife, Peter Dunlap. Explains that Bactrim has been e scribed to her husband's pharmacy and needs to be started asap. Reviewed RX with wife. Also, instructed her to have her husband stop taking aspirin. Questioned if Dr. Diona Fanti office has been in touch with them. She confirms they have not heard from them. Advised they call and arrange an appointment. Explained that [name] spoke with Dr. Diona Fanti and he wants to evaluate the patient as well. Wife, Peter Dunlap, verbalized understanding of all reviewed. Confirmed she has the correct number for Dr. Alan Ripper office.

## 2016-04-18 ENCOUNTER — Telehealth: Payer: Self-pay | Admitting: Radiation Oncology

## 2016-04-18 ENCOUNTER — Other Ambulatory Visit: Payer: Self-pay | Admitting: Family Medicine

## 2016-04-18 ENCOUNTER — Encounter: Payer: Self-pay | Admitting: Medical Oncology

## 2016-04-18 ENCOUNTER — Ambulatory Visit
Admission: RE | Admit: 2016-04-18 | Discharge: 2016-04-18 | Disposition: A | Discharge status: Home / Self Care | Payer: Medicare Other | Source: Ambulatory Visit | Attending: Radiation Oncology | Admitting: Radiation Oncology

## 2016-04-18 DIAGNOSIS — I495 Sick sinus syndrome: Secondary | ICD-10-CM | POA: Diagnosis not present

## 2016-04-18 DIAGNOSIS — E785 Hyperlipidemia, unspecified: Secondary | ICD-10-CM | POA: Diagnosis not present

## 2016-04-18 DIAGNOSIS — N3001 Acute cystitis with hematuria: Secondary | ICD-10-CM | POA: Diagnosis not present

## 2016-04-18 DIAGNOSIS — Z720 Tobacco use: Secondary | ICD-10-CM | POA: Diagnosis not present

## 2016-04-18 DIAGNOSIS — Z87891 Personal history of nicotine dependence: Secondary | ICD-10-CM | POA: Diagnosis not present

## 2016-04-18 DIAGNOSIS — Z510 Encounter for antineoplastic radiation therapy: Secondary | ICD-10-CM | POA: Diagnosis not present

## 2016-04-18 DIAGNOSIS — K219 Gastro-esophageal reflux disease without esophagitis: Secondary | ICD-10-CM | POA: Diagnosis not present

## 2016-04-18 DIAGNOSIS — E079 Disorder of thyroid, unspecified: Secondary | ICD-10-CM | POA: Diagnosis not present

## 2016-04-18 DIAGNOSIS — Z950 Presence of cardiac pacemaker: Secondary | ICD-10-CM | POA: Diagnosis not present

## 2016-04-18 DIAGNOSIS — I2510 Atherosclerotic heart disease of native coronary artery without angina pectoris: Secondary | ICD-10-CM | POA: Diagnosis not present

## 2016-04-18 DIAGNOSIS — I252 Old myocardial infarction: Secondary | ICD-10-CM | POA: Diagnosis not present

## 2016-04-18 DIAGNOSIS — Z9889 Other specified postprocedural states: Secondary | ICD-10-CM | POA: Diagnosis not present

## 2016-04-18 DIAGNOSIS — C61 Malignant neoplasm of prostate: Secondary | ICD-10-CM | POA: Diagnosis not present

## 2016-04-18 DIAGNOSIS — N453 Epididymo-orchitis: Secondary | ICD-10-CM | POA: Diagnosis not present

## 2016-04-18 DIAGNOSIS — I10 Essential (primary) hypertension: Secondary | ICD-10-CM | POA: Diagnosis not present

## 2016-04-18 NOTE — Telephone Encounter (Signed)
Phoned patient to inquire about status. Spoke with patient's wife, Raquel Sarna. She reports her husband began taking bactrim last night and feels a little better today. She confirms his testicles (especially the left) remain sore. She reports they are presently at Dr. Alan Ripper office being evaluated.

## 2016-04-18 NOTE — Progress Notes (Signed)
  Radiation Oncology         [phone]   Name: Peter Dunlap MRN: [id number]   Date: 04/17/2016  DOB: Sep 11, 1955   Weekly Radiation Therapy Management    ICD-9-CM ICD-10-CM   1. Prostate cancer ([place]) 185 C61     Current Dose: 1.8 Gy  Planned Dose:  78 Gy  Narrative The patient presents for a work in visit. He just started radiotherapy today. In the last 48 hours, he's noticed increasing urinary frequency, dysuria, perineal pain, and left groin pain that radiates to his left flank. He has become more weak, and reports that overnight he started having gross hematuria with clots. He continues on rapaflo. He denies fevers, chills, nausea, or vomiting. He is taking roxicet for this but only having minimal relief.   Physical Findings  weight is 306 lb (138.8 kg) (abnormal). His oral temperature is 98.4 F (36.9 C). His blood pressure is 123/72 and his pulse is 76. His respiration is 16 and oxygen saturation is 100%.   In general this is a tired  appearing African American male in no acute distress. He's alert and oriented x4 and appropriate throughout the examination. Cardiopulmonary assessment is negative for acute distress and he exhibits normal effort. There is questionable left sided CVA tenderness.   Impression Probable UTI  Plan Stat labs including CBC with diff, CMP, UA, C&S. I will contact Dr. Romilda Garret as well with results, and anticipate antibiotics to begin as well. He can also consider AZO standard OTC for urinary pain. He will continue with plans for XRT per Dr. Tammi Klippel, and will return this week for under treatment assessment by Dr. Tammi Klippel on Thursday.       Carola Rhine, PAC

## 2016-04-19 ENCOUNTER — Telehealth (HOSPITAL_COMMUNITY): Payer: Self-pay | Admitting: Physical Therapy

## 2016-04-19 ENCOUNTER — Ambulatory Visit (HOSPITAL_COMMUNITY): Payer: Medicare Other | Admitting: Physical Therapy

## 2016-04-19 ENCOUNTER — Ambulatory Visit
Admission: RE | Admit: 2016-04-19 | Discharge: 2016-04-19 | Disposition: A | Discharge status: Home / Self Care | Payer: Medicare Other | Source: Ambulatory Visit | Attending: Radiation Oncology | Admitting: Radiation Oncology

## 2016-04-19 DIAGNOSIS — E785 Hyperlipidemia, unspecified: Secondary | ICD-10-CM | POA: Diagnosis not present

## 2016-04-19 DIAGNOSIS — I495 Sick sinus syndrome: Secondary | ICD-10-CM | POA: Diagnosis not present

## 2016-04-19 DIAGNOSIS — Z510 Encounter for antineoplastic radiation therapy: Secondary | ICD-10-CM | POA: Diagnosis not present

## 2016-04-19 DIAGNOSIS — Z950 Presence of cardiac pacemaker: Secondary | ICD-10-CM | POA: Diagnosis not present

## 2016-04-19 DIAGNOSIS — I252 Old myocardial infarction: Secondary | ICD-10-CM | POA: Diagnosis not present

## 2016-04-19 DIAGNOSIS — C61 Malignant neoplasm of prostate: Secondary | ICD-10-CM | POA: Diagnosis not present

## 2016-04-19 DIAGNOSIS — I2510 Atherosclerotic heart disease of native coronary artery without angina pectoris: Secondary | ICD-10-CM | POA: Diagnosis not present

## 2016-04-19 DIAGNOSIS — K219 Gastro-esophageal reflux disease without esophagitis: Secondary | ICD-10-CM | POA: Diagnosis not present

## 2016-04-19 DIAGNOSIS — I10 Essential (primary) hypertension: Secondary | ICD-10-CM | POA: Diagnosis not present

## 2016-04-19 DIAGNOSIS — E079 Disorder of thyroid, unspecified: Secondary | ICD-10-CM | POA: Diagnosis not present

## 2016-04-19 DIAGNOSIS — Z9889 Other specified postprocedural states: Secondary | ICD-10-CM | POA: Diagnosis not present

## 2016-04-19 DIAGNOSIS — Z720 Tobacco use: Secondary | ICD-10-CM | POA: Diagnosis not present

## 2016-04-19 DIAGNOSIS — Z87891 Personal history of nicotine dependence: Secondary | ICD-10-CM | POA: Diagnosis not present

## 2016-04-19 LAB — URINE CULTURE

## 2016-04-19 NOTE — Progress Notes (Signed)
Dr. Diona Fanti,  Would you prefer he switch to fluoroquinolone versus Augmentin? I'm happy to call it in! [employer]

## 2016-04-19 NOTE — Telephone Encounter (Signed)
Called re: missed appointment.  No answer left message re:  his next appointment.  Rayetta Humphrey, [place] CLT [phone]

## 2016-04-20 ENCOUNTER — Ambulatory Visit
Admission: RE | Admit: 2016-04-20 | Discharge: 2016-04-20 | Disposition: A | Discharge status: Home / Self Care | Payer: Medicare Other | Source: Ambulatory Visit | Attending: Radiation Oncology | Admitting: Radiation Oncology

## 2016-04-20 ENCOUNTER — Telehealth: Payer: Self-pay | Admitting: Radiation Oncology

## 2016-04-20 VITALS — BP 118/44 | HR 73 | Resp 18 | Wt 302.4 lb

## 2016-04-20 DIAGNOSIS — I252 Old myocardial infarction: Secondary | ICD-10-CM | POA: Diagnosis not present

## 2016-04-20 DIAGNOSIS — E785 Hyperlipidemia, unspecified: Secondary | ICD-10-CM | POA: Diagnosis not present

## 2016-04-20 DIAGNOSIS — Z720 Tobacco use: Secondary | ICD-10-CM | POA: Diagnosis not present

## 2016-04-20 DIAGNOSIS — I495 Sick sinus syndrome: Secondary | ICD-10-CM | POA: Diagnosis not present

## 2016-04-20 DIAGNOSIS — K219 Gastro-esophageal reflux disease without esophagitis: Secondary | ICD-10-CM | POA: Diagnosis not present

## 2016-04-20 DIAGNOSIS — C61 Malignant neoplasm of prostate: Secondary | ICD-10-CM

## 2016-04-20 DIAGNOSIS — Z510 Encounter for antineoplastic radiation therapy: Secondary | ICD-10-CM | POA: Diagnosis not present

## 2016-04-20 DIAGNOSIS — I10 Essential (primary) hypertension: Secondary | ICD-10-CM | POA: Diagnosis not present

## 2016-04-20 DIAGNOSIS — Z87891 Personal history of nicotine dependence: Secondary | ICD-10-CM | POA: Diagnosis not present

## 2016-04-20 DIAGNOSIS — Z950 Presence of cardiac pacemaker: Secondary | ICD-10-CM | POA: Diagnosis not present

## 2016-04-20 DIAGNOSIS — E079 Disorder of thyroid, unspecified: Secondary | ICD-10-CM | POA: Diagnosis not present

## 2016-04-20 DIAGNOSIS — I2510 Atherosclerotic heart disease of native coronary artery without angina pectoris: Secondary | ICD-10-CM | POA: Diagnosis not present

## 2016-04-20 DIAGNOSIS — Z9889 Other specified postprocedural states: Secondary | ICD-10-CM | POA: Diagnosis not present

## 2016-04-20 MED ORDER — AMOXICILLIN-POT CLAVULANATE 875-125 MG PO TABS: 1.0000 | ORAL_TABLET | Freq: Two times a day (BID) | ORAL | 0 refills | Status: DC

## 2016-04-20 NOTE — Telephone Encounter (Signed)
Phoned patient per PA, [place]. Instructed him to resume his aspirin tomorrow. Also, spoke with wife and repeated the same. Both verbalized understanding.

## 2016-04-20 NOTE — Telephone Encounter (Signed)
I tried to call pt to let him know we need to change abx therapy. He cannot use fluoroquinolones due to his heart medication. A new prescription for Augmentin was called in and our therapists on the [facility] are going to inform him of this change.

## 2016-04-20 NOTE — Progress Notes (Signed)
  Radiation Oncology         [phone]   Name: Peter Dunlap MRN: [id number]   Date: 04/20/2016  DOB: [birth year]   Weekly Radiation Therapy Management    ICD-9-CM ICD-10-CM   1. Prostate cancer ([place]) 185 C61     Current Dose: 7.8 Gy  Planned Dose:  78 Gy  Narrative The patient presents for routine under treatment assessment.  Weight loss noted. Reports both poor appetite and dosage change of levothyroxine are contributing factors. Reports constant aching testicular pain as a 9/10 despite taking Roxicet. Understands to pick up Augmentin from his pharmacy today and stop taking fluroquinolone. This is for his UTI discovered in a recent urine sample. Reports urinary hesitancy continues. Reports mild dysuria. Reports urine is dark and foul smelling, but without hematuria. Reports nocturia x 3-4. Reports taking rapaflo as directed. Reports he hasn't had a bowel movement in 2 days.   Set-up films were reviewed. The chart was checked.  Physical Findings  weight is 302 lb 6.4 oz (137.2 kg) (abnormal). His blood pressure is 118/44 (abnormal) and his pulse is 73. His respiration is 18 and oxygen saturation is 100%. . Weight essentially stable.  No significant changes.  Impression The patient is tolerating radiation.  Plan Continue treatment as planned.         Sheral Apley Tammi Klippel, M.D.  This document serves as a record of services personally performed by Tyler Pita, MD. It was created on his behalf by Darcus Austin, a trained medical scribe. The creation of this record is based on the scribe's personal observations and the provider's statements to them. This document has been checked and approved by the attending provider.

## 2016-04-20 NOTE — Progress Notes (Signed)
Vitals stable. Weight loss noted. Reports both poor appetite and dosage change of levothyroxine are contributing factors. Reports constant aching testicular pain 9 on a scale of 0-10 despite taking Roxicet. Understands to pick up Augmentin from his pharmacy today and stop taking fluroquinolone. Reports urinary hesitancy continues. Reports mild dysuria. Reports urine is dark and foul smelling but, without hematuria. Reports nocturia x 3-4. Reports taking rapaflo as directed. Reports he hasn't had a bowel movement in 2 days.   BP (!) 118/44 (BP Location: Left Arm, Patient Position: Sitting, Cuff Size: Large)   Pulse 73   Resp 18   Wt (!) 302 lb 6.4 oz (137.2 kg)   SpO2 100%   BMI 42.18 kg/m  Wt Readings from Last 3 Encounters:  04/20/16 (!) 302 lb 6.4 oz (137.2 kg)  04/17/16 (!) 306 lb (138.8 kg)  03/10/16 (!) 316 lb (143.3 kg)

## 2016-04-21 ENCOUNTER — Ambulatory Visit
Admission: RE | Admit: 2016-04-21 | Discharge: 2016-04-21 | Disposition: A | Discharge status: Home / Self Care | Payer: Medicare Other | Source: Ambulatory Visit | Attending: Radiation Oncology | Admitting: Radiation Oncology

## 2016-04-21 DIAGNOSIS — E785 Hyperlipidemia, unspecified: Secondary | ICD-10-CM | POA: Diagnosis not present

## 2016-04-21 DIAGNOSIS — I2510 Atherosclerotic heart disease of native coronary artery without angina pectoris: Secondary | ICD-10-CM | POA: Diagnosis not present

## 2016-04-21 DIAGNOSIS — K219 Gastro-esophageal reflux disease without esophagitis: Secondary | ICD-10-CM | POA: Diagnosis not present

## 2016-04-21 DIAGNOSIS — E079 Disorder of thyroid, unspecified: Secondary | ICD-10-CM | POA: Diagnosis not present

## 2016-04-21 DIAGNOSIS — Z950 Presence of cardiac pacemaker: Secondary | ICD-10-CM | POA: Diagnosis not present

## 2016-04-21 DIAGNOSIS — I10 Essential (primary) hypertension: Secondary | ICD-10-CM | POA: Diagnosis not present

## 2016-04-21 DIAGNOSIS — I495 Sick sinus syndrome: Secondary | ICD-10-CM | POA: Diagnosis not present

## 2016-04-21 DIAGNOSIS — Z720 Tobacco use: Secondary | ICD-10-CM | POA: Diagnosis not present

## 2016-04-21 DIAGNOSIS — Z9889 Other specified postprocedural states: Secondary | ICD-10-CM | POA: Diagnosis not present

## 2016-04-21 DIAGNOSIS — Z87891 Personal history of nicotine dependence: Secondary | ICD-10-CM | POA: Diagnosis not present

## 2016-04-21 DIAGNOSIS — I252 Old myocardial infarction: Secondary | ICD-10-CM | POA: Diagnosis not present

## 2016-04-21 DIAGNOSIS — Z510 Encounter for antineoplastic radiation therapy: Secondary | ICD-10-CM | POA: Diagnosis not present

## 2016-04-21 DIAGNOSIS — C61 Malignant neoplasm of prostate: Secondary | ICD-10-CM | POA: Diagnosis not present

## 2016-04-23 ENCOUNTER — Emergency Department (HOSPITAL_COMMUNITY): Payer: Medicare Other

## 2016-04-23 ENCOUNTER — Inpatient Hospital Stay
Admission: RE | Admit: 2016-04-23 | Discharge: 2016-04-23 | Disposition: A | Discharge status: Home / Self Care | Payer: Self-pay | Source: Ambulatory Visit | Attending: Radiation Oncology | Admitting: Radiation Oncology

## 2016-04-23 ENCOUNTER — Encounter (HOSPITAL_COMMUNITY): Payer: Self-pay | Admitting: Emergency Medicine

## 2016-04-23 ENCOUNTER — Emergency Department (HOSPITAL_COMMUNITY)
Admission: EM | Admit: 2016-04-23 | Discharge: 2016-04-23 | Disposition: A | Discharge status: Home / Self Care | Payer: Medicare Other | Attending: Emergency Medicine | Admitting: Emergency Medicine

## 2016-04-23 ENCOUNTER — Ambulatory Visit
Admission: RE | Admit: 2016-04-23 | Discharge: 2016-04-23 | Disposition: A | Discharge status: Home / Self Care | Payer: Medicare Other | Source: Ambulatory Visit | Attending: Radiation Oncology | Admitting: Radiation Oncology

## 2016-04-23 VITALS — BP 107/76 | HR 47 | Temp 97.1°F

## 2016-04-23 DIAGNOSIS — C61 Malignant neoplasm of prostate: Secondary | ICD-10-CM

## 2016-04-23 DIAGNOSIS — Z7984 Long term (current) use of oral hypoglycemic drugs: Secondary | ICD-10-CM | POA: Insufficient documentation

## 2016-04-23 DIAGNOSIS — N451 Epididymitis: Secondary | ICD-10-CM | POA: Insufficient documentation

## 2016-04-23 DIAGNOSIS — I252 Old myocardial infarction: Secondary | ICD-10-CM | POA: Diagnosis not present

## 2016-04-23 DIAGNOSIS — Z720 Tobacco use: Secondary | ICD-10-CM | POA: Diagnosis not present

## 2016-04-23 DIAGNOSIS — I2510 Atherosclerotic heart disease of native coronary artery without angina pectoris: Secondary | ICD-10-CM | POA: Diagnosis not present

## 2016-04-23 DIAGNOSIS — E039 Hypothyroidism, unspecified: Secondary | ICD-10-CM | POA: Diagnosis not present

## 2016-04-23 DIAGNOSIS — Z9889 Other specified postprocedural states: Secondary | ICD-10-CM | POA: Diagnosis not present

## 2016-04-23 DIAGNOSIS — I110 Hypertensive heart disease with heart failure: Secondary | ICD-10-CM | POA: Insufficient documentation

## 2016-04-23 DIAGNOSIS — N433 Hydrocele, unspecified: Secondary | ICD-10-CM | POA: Diagnosis not present

## 2016-04-23 DIAGNOSIS — Z87891 Personal history of nicotine dependence: Secondary | ICD-10-CM | POA: Insufficient documentation

## 2016-04-23 DIAGNOSIS — Z510 Encounter for antineoplastic radiation therapy: Secondary | ICD-10-CM | POA: Diagnosis not present

## 2016-04-23 DIAGNOSIS — Z79899 Other long term (current) drug therapy: Secondary | ICD-10-CM | POA: Insufficient documentation

## 2016-04-23 DIAGNOSIS — Z7982 Long term (current) use of aspirin: Secondary | ICD-10-CM | POA: Insufficient documentation

## 2016-04-23 DIAGNOSIS — Z8546 Personal history of malignant neoplasm of prostate: Secondary | ICD-10-CM | POA: Diagnosis not present

## 2016-04-23 DIAGNOSIS — K219 Gastro-esophageal reflux disease without esophagitis: Secondary | ICD-10-CM | POA: Diagnosis not present

## 2016-04-23 DIAGNOSIS — I10 Essential (primary) hypertension: Secondary | ICD-10-CM | POA: Diagnosis not present

## 2016-04-23 DIAGNOSIS — I495 Sick sinus syndrome: Secondary | ICD-10-CM | POA: Diagnosis not present

## 2016-04-23 DIAGNOSIS — E119 Type 2 diabetes mellitus without complications: Secondary | ICD-10-CM | POA: Diagnosis not present

## 2016-04-23 DIAGNOSIS — I5032 Chronic diastolic (congestive) heart failure: Secondary | ICD-10-CM | POA: Diagnosis not present

## 2016-04-23 DIAGNOSIS — A599 Trichomoniasis, unspecified: Secondary | ICD-10-CM | POA: Insufficient documentation

## 2016-04-23 DIAGNOSIS — Z950 Presence of cardiac pacemaker: Secondary | ICD-10-CM | POA: Diagnosis not present

## 2016-04-23 DIAGNOSIS — N50812 Left testicular pain: Secondary | ICD-10-CM | POA: Diagnosis present

## 2016-04-23 DIAGNOSIS — N4400 Torsion of testis, unspecified: Secondary | ICD-10-CM

## 2016-04-23 DIAGNOSIS — E079 Disorder of thyroid, unspecified: Secondary | ICD-10-CM | POA: Diagnosis not present

## 2016-04-23 DIAGNOSIS — E785 Hyperlipidemia, unspecified: Secondary | ICD-10-CM | POA: Diagnosis not present

## 2016-04-23 LAB — CBC WITH DIFFERENTIAL/PLATELET
Basophils Absolute: 0.1 10*3/uL (ref 0.0–0.1)
Basophils Relative: 0 %
Eosinophils Absolute: 0 10*3/uL (ref 0.0–0.7)
Eosinophils Relative: 0 %
HCT: 51.9 % (ref 39.0–52.0)
Hemoglobin: 18 g/dL — ABNORMAL HIGH (ref 13.0–17.0)
Lymphocytes Relative: 17 %
Lymphs Abs: 2.7 10*3/uL (ref 0.7–4.0)
MCH: 32.7 pg (ref 26.0–34.0)
MCHC: 34.7 g/dL (ref 30.0–36.0)
MCV: 94.2 fL (ref 78.0–100.0)
Monocytes Absolute: 2.1 10*3/uL — ABNORMAL HIGH (ref 0.1–1.0)
Monocytes Relative: 13 %
Neutro Abs: 10.8 10*3/uL — ABNORMAL HIGH (ref 1.7–7.7)
Neutrophils Relative %: 70 %
Platelets: 333 10*3/uL (ref 150–400)
RBC: 5.51 MIL/uL (ref 4.22–5.81)
RDW: 13.7 % (ref 11.5–15.5)
WBC: 15.7 10*3/uL — ABNORMAL HIGH (ref 4.0–10.5)

## 2016-04-23 LAB — URINALYSIS, ROUTINE W REFLEX MICROSCOPIC
Glucose, UA: NEGATIVE mg/dL
Ketones, ur: NEGATIVE mg/dL
Nitrite: NEGATIVE
Protein, ur: NEGATIVE mg/dL
Specific Gravity, Urine: 1.034 — ABNORMAL HIGH (ref 1.005–1.030)
pH: 6 (ref 5.0–8.0)

## 2016-04-23 LAB — BASIC METABOLIC PANEL
Anion gap: 11 (ref 5–15)
BUN: 21 mg/dL — ABNORMAL HIGH (ref 6–20)
CO2: 22 mmol/L (ref 22–32)
Calcium: 9.6 mg/dL (ref 8.9–10.3)
Chloride: 101 mmol/L (ref 101–111)
Creatinine, Ser: 1.46 mg/dL — ABNORMAL HIGH (ref 0.61–1.24)
GFR calc Af Amer: 59 mL/min — ABNORMAL LOW (ref >60)
GFR calc non Af Amer: 51 mL/min — ABNORMAL LOW (ref >60)
Glucose, Bld: 108 mg/dL — ABNORMAL HIGH (ref 65–99)
Potassium: 5.3 mmol/L — ABNORMAL HIGH (ref 3.5–5.1)
Sodium: 134 mmol/L — ABNORMAL LOW (ref 135–145)

## 2016-04-23 LAB — URINE MICROSCOPIC-ADD ON

## 2016-04-23 MED ORDER — DOXYCYCLINE HYCLATE 100 MG PO CAPS: 100.0000 mg | ORAL_CAPSULE | Freq: Two times a day (BID) | ORAL | 0 refills | Status: DC

## 2016-04-23 MED ORDER — ONDANSETRON HCL 4 MG/2ML IJ SOLN
4.0000 mg | Freq: Once | INTRAMUSCULAR | Status: AC
  Administered 2016-04-23: 4 mg via INTRAVENOUS
  Filled 2016-04-23: qty 2

## 2016-04-23 MED ORDER — METRONIDAZOLE 500 MG PO TABS: 500.0000 mg | ORAL_TABLET | Freq: Three times a day (TID) | ORAL | 0 refills | Status: DC

## 2016-04-23 MED ORDER — AZITHROMYCIN 250 MG PO TABS
1000.0000 mg | ORAL_TABLET | Freq: Once | ORAL | Status: AC
  Administered 2016-04-23: 1000 mg via ORAL
  Filled 2016-04-23: qty 4

## 2016-04-23 MED ORDER — METRONIDAZOLE 500 MG PO TABS
2000.0000 mg | ORAL_TABLET | Freq: Once | ORAL | Status: AC
  Administered 2016-04-23: 2000 mg via ORAL
  Filled 2016-04-23: qty 4

## 2016-04-23 MED ORDER — HYDROMORPHONE HCL 1 MG/ML IJ SOLN
1.0000 mg | Freq: Once | INTRAMUSCULAR | Status: AC
  Administered 2016-04-23: 1 mg via INTRAVENOUS
  Filled 2016-04-23: qty 1

## 2016-04-23 MED ORDER — DEXTROSE 5 % IV SOLN
1.0000 g | Freq: Once | INTRAVENOUS | Status: AC
  Administered 2016-04-23: 1 g via INTRAVENOUS
  Filled 2016-04-23: qty 10

## 2016-04-23 MED ORDER — OXYCODONE-ACETAMINOPHEN 10-325 MG PO TABS: 1.0000 | ORAL_TABLET | Freq: Four times a day (QID) | ORAL | 0 refills | Status: DC | PRN

## 2016-04-23 NOTE — ED Notes (Signed)
US at bedside

## 2016-04-23 NOTE — ED Notes (Signed)
Bed: WA01 Expected date:  Expected time:  Means of arrival:  Comments: Cancer center-testicle pain

## 2016-04-23 NOTE — ED Notes (Signed)
PA and this RN reviewed dc paperwork with pt. Following iv antibiotic administration, pt will be ready for dc

## 2016-04-23 NOTE — ED Notes (Signed)
Pt given urinal and made aware of need for urine specimen 

## 2016-04-23 NOTE — Progress Notes (Addendum)
  Radiation Oncology         [phone]   Name: Peter Dunlap MRN: [id number]   Date: 04/23/2016  DOB: July 29, 1955   Weekly Radiation Therapy Management    ICD-9-CM ICD-10-CM   1. Prostate cancer ([place]) 185 C61     Current Dose: 11.7 Gy  Planned Dose:  78 Gy  Narrative The patient presents for routine under treatment assessment.  Peter Dunlap presents for his 6th fraction of radiation to his Prostate. He continues to report severe pain to his testicles and mentions a "knot" in that area. He states this pain started last Sunday. He rates the pain as a 10/10 today. He has been taking roxicet about every 6 hours for this pain without relief. He has taken Augmentin as prescribed since 04/20/16. He does report improvement in his urine. The foul odor has decreased and the color has improved. He reports pain in his bladder area which is constant, dysuria, nocturia x4, urgency and frequency during the day, and constipation in which he has not had a bowel movement in a week. He has tried castrol oil without relief and plans to try miralax today. He denies hematuria.   Set-up films were reviewed. The chart was checked.  Physical Findings  temperature is 97.1 F (36.2 C). His blood pressure is 107/76 and his pulse is 47 (abnormal). His oxygen saturation is 99%.  The patient is in a wheelchair. The patient has lost 14 lbs since 03/10/16. The patient's left testicle is enlarged and exquisitely tender. There is no appreciable hernia in the scrotum. Exam was limited by severe tenderness.   Impression The patient reports extreme left testicular pain.  Plan I spoke with the on call urologist about the patient's symptoms. Based in his input, I proposed a plan to continue antibiotics and pain medicine in the hopes of improvement. The patient felt that he could not go home at this time without further pain relief. We discussed the option of proceeding to the emergency room. The patient understood that this may  entail a more prolonged evaluation. The patient expressed a strong preference to proceed to the emergency room given his pain.  In the ED, I anticipate the patient may benefit from the following: 1. Aggressive analgesia 2. Baseline evaluation for urosepsis 3. Possible scrotal imaging to rule out testicular torsion if appropriate.           Sheral Apley Tammi Klippel, M.D.  This document serves as a record of services personally performed by Tyler Pita, MD. It was created on his behalf by Darcus Austin, a trained medical scribe. The creation of this record is based on the scribe's personal observations and the provider's statements to them. This document has been checked and approved by the attending provider.

## 2016-04-23 NOTE — ED Triage Notes (Signed)
Pt was brought over after radiation to his prostate c/o testicular pain. Pt states having it for couple off weeks, was recently treated for UTI. States his urine seems clearer but still having pain and would like to have that evaluated. Pt states he does have a knot in his testicles, but is not new. BP before transport here 107/65

## 2016-04-23 NOTE — ED Provider Notes (Signed)
[facility] DEPT Provider Note   CSN: [id number] Arrival date & time: 04/23/16  1214     History   Chief Complaint Chief Complaint  Patient presents with  . Testicle Pain    HPI Peter Dunlap is a 60 y.o. male who presents emergency Department with chief complaint of testicle pain. The patient states that his symptoms began about 1 week ago. He complains of severe pain in his left testicle with swelling. He is currently undergoing radiation therapy for prostate cancer. The patient is also being treated for urinary tract infection. He states he is still currently taking his medications. He has some pain that also extends into his lower abdomen. He denies flank pain, fevers.  HPI  Past Medical History:  Diagnosis Date  . Arteriosclerotic cardiovascular disease (ASCVD) 2005   catheterization in 10/2010:50% mid LAD, diffuse distal disease, circumflex irregularities, large dominant RCA with a 50% ostial, 70% distal, 60% posterolateral and 70% PDA; normal EF  . Arthritis   . Benign prostatic hypertrophy   . Cerebrovascular disease 2010   R. carotid endarterectomy; Duplex in 10/2010-widely patent ICAs, subtotal left vertebral-not thought to be contributing to symptoms  . Cervical spine disease    CT in 2012-advanced degeneration and spondylosis with moderate spinal stenosis at C3-C6  . Depression   . Erectile dysfunction   . Gastroesophageal reflux disease   . H/O hiatal hernia   . H/O: substance abuse    Cocaine, marijuana, alcohol.  Quit 2013.   [name] Hyperlipidemia   . Hypertension   . Non-ST elevation myocardial infarction (NSTEMI), initial episode of care [facility]) 12/02/2013   DES LAD  . Obesity   . Prostate cancer ([place])   . Sleep apnea    CPAP  . Tachy-brady syndrome ([place])    a. s/p STJ dual chamber PPM   . Thyroid disease   . Tobacco abuse    Quit 2014    Patient Active Problem List   Diagnosis Date Noted  . Prostate cancer ([place]) 03/10/2016  . Right-sided  extracranial carotid artery stenosis 11/25/2015  . Former smoker 11/25/2015  . History of right-sided carotid endarterectomy 11/25/2015  . Encounter for postoperative carotid endarterectomy surveillance 11/25/2015  . Paroxysmal atrial fibrillation (H[facility])   . Persistent atrial fibrillation ([place]) 08/05/2015  . Pacemaker-St Jude 09/18/2014  . Tachy-brady syndrome ([place]) 08/19/2014  . Chest pain 08/04/2014  . Atrial flutter with rapid ventricular response ([place])   . Atrial fibrillation ([place]) 07/31/2014  . Chronic insomnia 07/24/2014  . OA (osteoarthritis) of knee 05/26/2014  . S/P subdural hematoma evacuation-Nov 2015 04/10/2014  . 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 Chronic diastolic heart failure ([place]) 03/04/2014  . 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 Gout 02/17/2014  . OSA (obstructive sleep apnea) 02/17/2014  . Obesity (BMI 30-39.9) 12/04/2013  . Diabetes mellitus type 2 in obese ([place]) 12/03/2013  . Aftercare following surgery of the circulatory system, [place] 11/13/2013  . Hypertrophic cardiomyopathy ([place]) 09/07/2011  . Hypertension   . CAD- LAD DES 12/03/13, patent Feb 2016   . Cerebrovascular disease   . Hyperlipidemia   . ERECTILE DYSFUNCTION, ORGANIC 08/11/2010  . PSVT (paroxysmal supraventricular tachycardia) ([place]) 06/03/2008  . PERIPHERAL NEUROPATHY 01/28/2008  . BPH (benign prostatic hypertrophy) 05/11/2006  . GERD 04/23/2006  . BACK PAIN 04/23/2006    Past Surgical History:  Procedure Laterality Date  . BRAIN SURGERY  2015   hematoma evacuation  . BURR HOLE Right 04/13/2014   Procedure: BURR HOLES;  Surgeon: Charlie Pitter, MD;  Location: [facility] NEURO ORS;  Service: Neurosurgery;  Laterality: Right;  . CAROTID ENDARTERECTOMY Right Feb. 25, 2010    CEA  . CORONARY ANGIOPLASTY WITH STENT PLACEMENT  12/03/2013   LAD 90%-->0% W/ Promus Premier DES 3.0 mm x 16 mm, CFX OK, RCA 40%, EF 70-75%  . LEFT ATRIAL APPENDAGE OCCLUSION N/A  08/05/2015   Procedure: LEFT ATRIAL APPENDAGE OCCLUSION;  Surgeon: [name], MD;  Location: [facility] CV LAB;  Service: Cardiovascular;  Laterality: N/A;  . LEFT HEART CATHETERIZATION WITH CORONARY ANGIOGRAM Left 12/03/2013   Procedure: LEFT HEART CATHETERIZATION WITH CORONARY ANGIOGRAM;  Surgeon: Leonie Man, MD;  Location: [facility] CATH LAB;  Service: Cardiovascular;  Laterality: Left;  . LEFT HEART CATHETERIZATION WITH CORONARY ANGIOGRAM N/A 01/26/2014   Procedure: LEFT HEART CATHETERIZATION WITH CORONARY ANGIOGRAM;  Surgeon: Jettie Booze, MD;  Location: [facility] CATH LAB;  Service: Cardiovascular;  Laterality: N/A;  . LEFT HEART CATHETERIZATION WITH CORONARY ANGIOGRAM N/A 08/03/2014   Procedure: LEFT HEART CATHETERIZATION WITH CORONARY ANGIOGRAM;  Surgeon: Burnell Blanks, MD;  Location: [facility] CATH LAB;  Service: Cardiovascular;  Laterality: N/A;  . PERCUTANEOUS CORONARY STENT INTERVENTION (PCI-S)  12/03/2013   Procedure: PERCUTANEOUS CORONARY STENT INTERVENTION (PCI-S);  Surgeon: Leonie Man, MD;  Location: [facility] CATH LAB;  Service: Cardiovascular;;  . PERMANENT PACEMAKER INSERTION N/A 09/18/2014   Procedure: PERMANENT PACEMAKER INSERTION;  Surgeon: [name], MD;  Location: [facility] CATH LAB;  Service: Cardiovascular;  Laterality: N/A;  . RADIOFREQUENCY ABLATION  2005   for PSVT  . TEE WITHOUT CARDIOVERSION N/A 07/27/2015   Procedure: TRANSESOPHAGEAL ECHOCARDIOGRAM (TEE);  Surgeon: Lelon Perla, MD;  Location: [facility] ENDOSCOPY;  Service: Cardiovascular;  Laterality: N/A;  . TEE WITHOUT CARDIOVERSION N/A 09/15/2015   Procedure: TRANSESOPHAGEAL ECHOCARDIOGRAM (TEE);  Surgeon: Thayer Headings, MD;  Location: [facility];  Service: Cardiovascular;  Laterality: N/A;       Home Medications    Prior to Admission medications   Medication Sig Start Date End Date Taking? Authorizing Provider  allopurinol (ZYLOPRIM) 100 MG tablet TAKE 1 TABLET BY MOUTH EVERY DAY 10/27/15  Yes [name], MD   amoxicillin-clavulanate (AUGMENTIN) 875-125 MG tablet Take 1 tablet by mouth 2 (two) times daily. 04/20/16  Yes Hayden Pedro, PA-C  aspirin EC 325 MG EC tablet Take 1 tablet (325 mg total) by mouth daily. 09/21/15  Yes Amber Sena Slate, NP  colchicine 0.6 MG tablet Take 1 tablet (0.6 mg total) by mouth daily. 11/29/15  Yes Shawn C Joy, PA-C  DEXILANT 60 MG capsule TAKE 1 CAPSULE BY MOUTH EVERY DAY 04/18/16  Yes [name], MD  furosemide (LASIX) 20 MG tablet TAKE 3 TABLETS BY MOUTH DAILY 10/21/15  Yes [name], MD  isosorbide mononitrate (IMDUR) 60 MG 24 hr tablet TAKE 1 TABLET BY MOUTH EVERY DAY 02/10/15  Yes Burtis Junes, NP  levothyroxine (SYNTHROID, LEVOTHROID) 200 MCG tablet Take 1 tablet (200 mcg total) by mouth daily before breakfast. 02/29/16  Yes Cassandria Anger, MD  Magnesium Oxide 400 MG CAPS Take 1 capsule (400 mg total) by mouth 2 (two) times daily. 08/27/15  Yes Amber Sena Slate, NP  metFORMIN (GLUCOPHAGE) 500 MG tablet TAKE 1 TABLET(500 MG) BY MOUTH TWICE DAILY WITH A MEAL Patient taking differently: TAKE 1 TABLET(500 MG) BY MOUTH ONCE DAILY WITH A MEAL 01/27/16  Yes Cassandria Anger, MD  metoprolol tartrate (LOPRESSOR) 25 MG tablet TAKE 1 TABLET(25 MG) BY  MOUTH TWICE DAILY 10/13/15  Yes [name], MD  nitroGLYCERIN (NITROSTAT) 0.4 MG SL tablet PLACE 1 TABLET UNDER THE TONGUE EVERY 5 MINUTES AS NEEDED FOR CHEST PAIN 03/13/16  Yes [name], MD  potassium chloride SA (K-DUR,KLOR-CON) 20 MEQ tablet TAKE 1 TABLET BY MOUTH EVERY DAY 11/19/15  Yes [name], MD  RAPAFLO 8 MG CAPS capsule TAKE 1 CAPSULE(8 MG) BY MOUTH DAILY 11/23/15  Yes [name], MD  simvastatin (ZOCOR) 40 MG tablet TAKE 1 TABLET(40 MG) BY MOUTH DAILY 04/03/16  Yes [name], MD  sotalol (BETAPACE) 120 MG tablet Take 1 tablet (120 mg total) by mouth 2 (two) times daily. 02/16/16  Yes Amber Sena Slate, NP  doxycycline (VIBRAMYCIN) 100 MG capsule Take 1 capsule (100 mg total) by  mouth 2 (two) times daily. 04/23/16   Margarita Mail, PA-C  metroNIDAZOLE (FLAGYL) 500 MG tablet Take 1 tablet (500 mg total) by mouth 3 (three) times daily. One po bid x 7 days 04/23/16   Margarita Mail, PA-C  ONE TOUCH ULTRA TEST test strip USE TO TEST ONCE DAILY 07/23/15   [name], MD  oxyCODONE-acetaminophen (PERCOCET) 10-325 MG tablet Take 1 tablet by mouth every 6 (six) hours as needed for pain. 04/23/16   Margarita Mail, PA-C  traZODone (DESYREL) 50 MG tablet TAKE 1/2 TO 1 TABLET BY MOUTH AT BEDTIME AS NEEDED FOR SLEEP Patient not taking: Reported on 04/23/2016 12/21/14   [name], MD    Family History Family History  Problem Relation Age of Onset  . Hypertension Mother     Cerebrovascular disease  . Diabetes Mother   . Coronary artery disease Father 9  . Diabetes type II Father   . Hypertension Father   . Heart attack Father   . Diabetes Brother   . Hypertension Brother   . Lung cancer Paternal Uncle   . Diabetes Sister   . Hypertension Sister   . Heart attack Sister 4  . Cancer Sister     leukemia  . Cancer Maternal Uncle     breast  . Cancer Maternal Grandmother     breast    Social History Social History  Substance Use Topics  . Smoking status: Former Smoker    Packs/day: 1.00    Years: 40.00    Types: Cigarettes    Start date: 10/20/1972    Quit date: 10/10/2012  . Smokeless tobacco: Never Used     Comment: Quit in May.   . Alcohol use No     Comment: former drinker-- sober since 2013.      Allergies   Lactose intolerance (gi)   Review of Systems Review of Systems  Ten systems reviewed and are negative for acute change, except as noted in the HPI.   Physical Exam Updated Vital Signs BP 110/80   Pulse 64   Temp 97.5 F (36.4 C) (Oral)   Resp 16   Ht 6' (1.829 m)   Wt (!) 137.4 kg   SpO2 95%   BMI 41.09 kg/m   Physical Exam Physical Exam  Nursing note and vitals reviewed. Constitutional: He appears well-developed and  well-nourished. No distress.  HENT:  Head: Normocephalic and atraumatic.  Eyes: Conjunctivae normal are normal. No scleral icterus.  Neck: Normal range of motion. Neck supple.  Cardiovascular: Normal rate, regular rhythm and normal heart sounds.   Pulmonary/Chest: Effort normal and breath sounds normal. No respiratory distress.  Abdominal: Soft. There is no tenderness.  Musculoskeletal: He exhibits no edema.  GU: Normal male anatomy, right testicle is normal in size without pain. Left testicle markedly swollen with a palpable mass extending into the region of the spermatic cord. It is firm and exquisitely tender to palpation. Normal cremasteric reflex bilaterally.  Neurological: He is alert.  Skin: Skin is warm and dry. He is not diaphoretic.  Psychiatric: His behavior is normal.     ED Treatments / Results  Labs (all labs ordered are listed, but only abnormal results are displayed) Labs Reviewed  URINALYSIS, ROUTINE W REFLEX MICROSCOPIC (NOT AT [facility]) - Abnormal; Notable for the following:       Result Value   Color, Urine ORANGE (*)    APPearance CLOUDY (*)    Specific Gravity, Urine 1.034 (*)    Hgb urine dipstick MODERATE (*)    Bilirubin Urine SMALL (*)    Leukocytes, UA MODERATE (*)    All other components within normal limits  BASIC METABOLIC PANEL - Abnormal; Notable for the following:    Sodium 134 (*)    Potassium 5.3 (*)    Glucose, Bld 108 (*)    BUN 21 (*)    Creatinine, Ser 1.46 (*)    GFR calc non Af Amer 51 (*)    GFR calc Af Amer 59 (*)    All other components within normal limits  CBC WITH DIFFERENTIAL/PLATELET - Abnormal; Notable for the following:    WBC 15.7 (*)    Hemoglobin 18.0 (*)    Neutro Abs 10.8 (*)    Monocytes Absolute 2.1 (*)    All other components within normal limits  URINE MICROSCOPIC-ADD ON - Abnormal; Notable for the following:    Squamous Epithelial / LPF 6-30 (*)    Bacteria, UA FEW (*)    All other components within normal limits    URINE CULTURE  GC/CHLAMYDIA PROBE AMP ([place]) NOT AT [facility]    EKG  EKG Interpretation None       Radiology US Scrotum  Result Date: 04/23/2016 CLINICAL DATA:  Left scrotal region pain for 1 week. History of prostate carcinoma EXAM: SCROTAL ULTRASOUND DOPPLER ULTRASOUND OF THE TESTICLES TECHNIQUE: Complete ultrasound examination of the testicles, epididymis, and other scrotal structures was performed. Color and spectral Doppler ultrasound were also utilized to evaluate blood flow to the testicles. COMPARISON:  None. FINDINGS: Right testicle Measurements: 3.7 x 2.1 x 2.8 cm. No mass or microlithiasis visualized. Left testicle Measurements: 3.3 x 2.8 x 2.5 cm. No mass or microlithiasis visualized. A 2 mm calcification is noted in the left testis as an isolated finding. There is no surrounding edema. Right epididymis:  Normal in size and appearance. Left epididymis: There is diffuse epididymal enlargement, most particularly in the epididymal tail region. The epididymal tail on the left measures 3.3 x 3.0 cm. There is edema and hypervascularity in this area. Hydrocele: There is a moderate hydrocele on the left. There is a minimal hydrocele on the right. Varicocele:  None visualized. Pulsed Doppler interrogation of both testes demonstrates normal low resistance arterial and venous waveforms bilaterally. There is scrotal wall thickening on the left. No frank scrotal abscess evident. IMPRESSION: Extensive epididymitis on the left with hyperemia and enlargement/edema of the epididymis on the left, particularly involving the tail region. There is scrotal wall thickening on the left with moderate left-sided hydrocele. There is a minimal right-sided hydrocele. No epididymitis is seen on the right. There is a 2 mm calcification in the left testis, a finding  most likely residua of old trauma or possibly residua of prior infection. There is no intratesticular mass or evidence of testicular torsion on either  side. No orchitis evident. Electronically Signed   By: Lowella Grip III M.D.   On: 04/23/2016 14:17   [place] Art/ven Flow Abd Pelv Doppler  Result Date: 04/23/2016 CLINICAL DATA:  Left scrotal region pain for 1 week. History of prostate carcinoma EXAM: SCROTAL ULTRASOUND DOPPLER ULTRASOUND OF THE TESTICLES TECHNIQUE: Complete ultrasound examination of the testicles, epididymis, and other scrotal structures was performed. Color and spectral Doppler ultrasound were also utilized to evaluate blood flow to the testicles. COMPARISON:  None. FINDINGS: Right testicle Measurements: 3.7 x 2.1 x 2.8 cm. No mass or microlithiasis visualized. Left testicle Measurements: 3.3 x 2.8 x 2.5 cm. No mass or microlithiasis visualized. A 2 mm calcification is noted in the left testis as an isolated finding. There is no surrounding edema. Right epididymis:  Normal in size and appearance. Left epididymis: There is diffuse epididymal enlargement, most particularly in the epididymal tail region. The epididymal tail on the left measures 3.3 x 3.0 cm. There is edema and hypervascularity in this area. Hydrocele: There is a moderate hydrocele on the left. There is a minimal hydrocele on the right. Varicocele:  None visualized. Pulsed Doppler interrogation of both testes demonstrates normal low resistance arterial and venous waveforms bilaterally. There is scrotal wall thickening on the left. No frank scrotal abscess evident. IMPRESSION: Extensive epididymitis on the left with hyperemia and enlargement/edema of the epididymis on the left, particularly involving the tail region. There is scrotal wall thickening on the left with moderate left-sided hydrocele. There is a minimal right-sided hydrocele. No epididymitis is seen on the right. There is a 2 mm calcification in the left testis, a finding most likely residua of old trauma or possibly residua of prior infection. There is no intratesticular mass or evidence of testicular torsion on  either side. No orchitis evident. Electronically Signed   By: Lowella Grip III M.D.   On: 04/23/2016 14:17    Procedures Procedures (including critical care time)  Medications Ordered in ED Medications  HYDROmorphone (DILAUDID) injection 1 mg (1 mg Intravenous Given 04/23/16 1343)  ondansetron (ZOFRAN) injection 4 mg (4 mg Intravenous Given 04/23/16 1343)  cefTRIAXone (ROCEPHIN) 1 g in dextrose 5 % 50 mL IVPB (1 g Intravenous New Bag/Given 04/23/16 1534)  azithromycin (ZITHROMAX) tablet 1,000 mg (1,000 mg Oral Given 04/23/16 1534)  metroNIDAZOLE (FLAGYL) tablet 2,000 mg (2,000 mg Oral Given 04/23/16 1534)     Initial Impression / Assessment and Plan / ED Course  I have reviewed the triage vital signs and the nursing notes.  Pertinent labs & imaging results that were available during my care of the patient were reviewed by me and considered in my medical decision making (see chart for details).  Clinical Course as of Apr 24 1603  Nancy Fetter Apr 23, 2016  1438 Patient with Trich positive ua,   Trichomonas, UA: PRESENT [AH]  1602 This was minimally elevated potassium. I suspect this is secondary to hemolysis of the sample. He does appear slightly dehydrated. His creatinine is slightly above his baseline. The patient states that he has not been sexually active for years. He is unable to get an erection secondary to his prostate cancer and cardiovascular disease. I suspect that the trick. Positive urine is lab error. Patient to his urine, however, for trich. I discussed the findings with the patient. He is to follow up with his  primary care physician. I have also increased his dose of Percocet secondary to his acute epididymitis and severe testicular pain. I have also sent a message to his PCP regarding the change in medication dosage. Urine sent for culture. Discussed the case with Dr. Delice Lesch who agrees with workup, plan of care and discharge medications.  [AH]    Clinical Course User Index [AH]  Margarita Mail, PA-C      Final Clinical Impressions(s) / ED Diagnoses   Final diagnoses:  Epididymitis  Trichimoniasis    New Prescriptions New Prescriptions   DOXYCYCLINE (VIBRAMYCIN) 100 MG CAPSULE    Take 1 capsule (100 mg total) by mouth 2 (two) times daily.   METRONIDAZOLE (FLAGYL) 500 MG TABLET    Take 1 tablet (500 mg total) by mouth 3 (three) times daily. One po bid x 7 days   OXYCODONE-ACETAMINOPHEN (PERCOCET) 10-325 MG TABLET    Take 1 tablet by mouth every 6 (six) hours as needed for pain.     Margarita Mail, PA-C 04/23/16 [place], MD 04/30/16 [phone]

## 2016-04-23 NOTE — Progress Notes (Signed)
Mr. Schnitker presents for his 6th fraction of radiation to his Prostate. He continues to report severe pain to his testicles and mentions a "knot" in that area. He rates it a 10/10 today. He has been taking roxicet about every 6 hours for this pain without relief. He has taken Augmentin as prescribed since 04/20/16. He does report improvement in his urine, the foul odor has decreased and the color has improved. He reports pain in his bladder area which is constant. He denies hematuria. He reports burning with urination. He reports getting up 4 times nightly to void. He does have urgency and frequency during the day. He reports constipation and has not had a bowel movement in a week. He has tried castrol oil without relief. He plans to try miralax today.   BP 107/76   Pulse (!) 47   Temp 97.1 F (36.2 C) Comment: taken by family member this am  SpO2 99% Comment: room air   Wt Readings from Last 3 Encounters:  04/20/16 (!) 302 lb 6.4 oz (137.2 kg)  04/17/16 (!) 306 lb (138.8 kg)  03/10/16 (!) 316 lb (143.3 kg)

## 2016-04-24 ENCOUNTER — Ambulatory Visit
Admission: RE | Admit: 2016-04-24 | Discharge: 2016-04-24 | Disposition: A | Discharge status: Home / Self Care | Payer: Medicare Other | Source: Ambulatory Visit | Attending: Radiation Oncology | Admitting: Radiation Oncology

## 2016-04-24 DIAGNOSIS — I10 Essential (primary) hypertension: Secondary | ICD-10-CM | POA: Diagnosis not present

## 2016-04-24 DIAGNOSIS — Z510 Encounter for antineoplastic radiation therapy: Secondary | ICD-10-CM | POA: Diagnosis not present

## 2016-04-24 DIAGNOSIS — E785 Hyperlipidemia, unspecified: Secondary | ICD-10-CM | POA: Diagnosis not present

## 2016-04-24 DIAGNOSIS — C61 Malignant neoplasm of prostate: Secondary | ICD-10-CM | POA: Diagnosis not present

## 2016-04-24 DIAGNOSIS — I252 Old myocardial infarction: Secondary | ICD-10-CM | POA: Diagnosis not present

## 2016-04-24 DIAGNOSIS — Z9889 Other specified postprocedural states: Secondary | ICD-10-CM | POA: Diagnosis not present

## 2016-04-24 DIAGNOSIS — I2510 Atherosclerotic heart disease of native coronary artery without angina pectoris: Secondary | ICD-10-CM | POA: Diagnosis not present

## 2016-04-24 DIAGNOSIS — K219 Gastro-esophageal reflux disease without esophagitis: Secondary | ICD-10-CM | POA: Diagnosis not present

## 2016-04-24 DIAGNOSIS — Z87891 Personal history of nicotine dependence: Secondary | ICD-10-CM | POA: Diagnosis not present

## 2016-04-24 DIAGNOSIS — I495 Sick sinus syndrome: Secondary | ICD-10-CM | POA: Diagnosis not present

## 2016-04-24 DIAGNOSIS — E079 Disorder of thyroid, unspecified: Secondary | ICD-10-CM | POA: Diagnosis not present

## 2016-04-24 DIAGNOSIS — Z720 Tobacco use: Secondary | ICD-10-CM | POA: Diagnosis not present

## 2016-04-24 DIAGNOSIS — Z950 Presence of cardiac pacemaker: Secondary | ICD-10-CM | POA: Diagnosis not present

## 2016-04-25 ENCOUNTER — Ambulatory Visit
Admission: RE | Admit: 2016-04-25 | Discharge: 2016-04-25 | Disposition: A | Discharge status: Home / Self Care | Payer: Medicare Other | Source: Ambulatory Visit | Attending: Radiation Oncology | Admitting: Radiation Oncology

## 2016-04-25 DIAGNOSIS — Z950 Presence of cardiac pacemaker: Secondary | ICD-10-CM | POA: Diagnosis not present

## 2016-04-25 DIAGNOSIS — Z510 Encounter for antineoplastic radiation therapy: Secondary | ICD-10-CM | POA: Diagnosis not present

## 2016-04-25 DIAGNOSIS — Z9889 Other specified postprocedural states: Secondary | ICD-10-CM | POA: Diagnosis not present

## 2016-04-25 DIAGNOSIS — I252 Old myocardial infarction: Secondary | ICD-10-CM | POA: Diagnosis not present

## 2016-04-25 DIAGNOSIS — E079 Disorder of thyroid, unspecified: Secondary | ICD-10-CM | POA: Diagnosis not present

## 2016-04-25 DIAGNOSIS — I2510 Atherosclerotic heart disease of native coronary artery without angina pectoris: Secondary | ICD-10-CM | POA: Diagnosis not present

## 2016-04-25 DIAGNOSIS — I10 Essential (primary) hypertension: Secondary | ICD-10-CM | POA: Diagnosis not present

## 2016-04-25 DIAGNOSIS — C61 Malignant neoplasm of prostate: Secondary | ICD-10-CM | POA: Diagnosis not present

## 2016-04-25 DIAGNOSIS — E785 Hyperlipidemia, unspecified: Secondary | ICD-10-CM | POA: Diagnosis not present

## 2016-04-25 DIAGNOSIS — Z87891 Personal history of nicotine dependence: Secondary | ICD-10-CM | POA: Diagnosis not present

## 2016-04-25 DIAGNOSIS — I495 Sick sinus syndrome: Secondary | ICD-10-CM | POA: Diagnosis not present

## 2016-04-25 DIAGNOSIS — K219 Gastro-esophageal reflux disease without esophagitis: Secondary | ICD-10-CM | POA: Diagnosis not present

## 2016-04-25 DIAGNOSIS — Z720 Tobacco use: Secondary | ICD-10-CM | POA: Diagnosis not present

## 2016-04-25 LAB — URINE CULTURE: Culture: NO GROWTH

## 2016-04-26 ENCOUNTER — Encounter (HOSPITAL_COMMUNITY): Payer: Medicare Other

## 2016-04-26 ENCOUNTER — Ambulatory Visit
Admission: RE | Admit: 2016-04-26 | Discharge: 2016-04-26 | Disposition: A | Discharge status: Home / Self Care | Payer: Medicare Other | Source: Ambulatory Visit | Attending: Radiation Oncology | Admitting: Radiation Oncology

## 2016-04-26 DIAGNOSIS — I10 Essential (primary) hypertension: Secondary | ICD-10-CM | POA: Diagnosis not present

## 2016-04-26 DIAGNOSIS — I495 Sick sinus syndrome: Secondary | ICD-10-CM | POA: Diagnosis not present

## 2016-04-26 DIAGNOSIS — Z9889 Other specified postprocedural states: Secondary | ICD-10-CM | POA: Diagnosis not present

## 2016-04-26 DIAGNOSIS — I252 Old myocardial infarction: Secondary | ICD-10-CM | POA: Diagnosis not present

## 2016-04-26 DIAGNOSIS — K219 Gastro-esophageal reflux disease without esophagitis: Secondary | ICD-10-CM | POA: Diagnosis not present

## 2016-04-26 DIAGNOSIS — C61 Malignant neoplasm of prostate: Secondary | ICD-10-CM | POA: Diagnosis not present

## 2016-04-26 DIAGNOSIS — Z510 Encounter for antineoplastic radiation therapy: Secondary | ICD-10-CM | POA: Diagnosis not present

## 2016-04-26 DIAGNOSIS — E785 Hyperlipidemia, unspecified: Secondary | ICD-10-CM | POA: Diagnosis not present

## 2016-04-26 DIAGNOSIS — Z950 Presence of cardiac pacemaker: Secondary | ICD-10-CM | POA: Diagnosis not present

## 2016-04-26 DIAGNOSIS — Z720 Tobacco use: Secondary | ICD-10-CM | POA: Diagnosis not present

## 2016-04-26 DIAGNOSIS — E079 Disorder of thyroid, unspecified: Secondary | ICD-10-CM | POA: Diagnosis not present

## 2016-04-26 DIAGNOSIS — Z87891 Personal history of nicotine dependence: Secondary | ICD-10-CM | POA: Diagnosis not present

## 2016-04-26 DIAGNOSIS — I2510 Atherosclerotic heart disease of native coronary artery without angina pectoris: Secondary | ICD-10-CM | POA: Diagnosis not present

## 2016-04-28 ENCOUNTER — Ambulatory Visit: Payer: Medicare Other

## 2016-04-29 NOTE — Addendum Note (Signed)
Encounter addended by: Tyler Pita, MD on: 04/29/2016  4:01 PM<BR>    Actions taken: Problem List reviewed, Sign clinical note

## 2016-05-01 ENCOUNTER — Ambulatory Visit
Admission: RE | Admit: 2016-05-01 | Discharge: 2016-05-01 | Disposition: A | Discharge status: Home / Self Care | Payer: Medicare Other | Source: Ambulatory Visit | Attending: Radiation Oncology | Admitting: Radiation Oncology

## 2016-05-01 DIAGNOSIS — I10 Essential (primary) hypertension: Secondary | ICD-10-CM | POA: Diagnosis not present

## 2016-05-01 DIAGNOSIS — Z9889 Other specified postprocedural states: Secondary | ICD-10-CM | POA: Diagnosis not present

## 2016-05-01 DIAGNOSIS — E785 Hyperlipidemia, unspecified: Secondary | ICD-10-CM | POA: Diagnosis not present

## 2016-05-01 DIAGNOSIS — K219 Gastro-esophageal reflux disease without esophagitis: Secondary | ICD-10-CM | POA: Diagnosis not present

## 2016-05-01 DIAGNOSIS — Z87891 Personal history of nicotine dependence: Secondary | ICD-10-CM | POA: Diagnosis not present

## 2016-05-01 DIAGNOSIS — Z720 Tobacco use: Secondary | ICD-10-CM | POA: Diagnosis not present

## 2016-05-01 DIAGNOSIS — Z510 Encounter for antineoplastic radiation therapy: Secondary | ICD-10-CM | POA: Diagnosis not present

## 2016-05-01 DIAGNOSIS — I2510 Atherosclerotic heart disease of native coronary artery without angina pectoris: Secondary | ICD-10-CM | POA: Diagnosis not present

## 2016-05-01 DIAGNOSIS — I252 Old myocardial infarction: Secondary | ICD-10-CM | POA: Diagnosis not present

## 2016-05-01 DIAGNOSIS — E079 Disorder of thyroid, unspecified: Secondary | ICD-10-CM | POA: Diagnosis not present

## 2016-05-01 DIAGNOSIS — I495 Sick sinus syndrome: Secondary | ICD-10-CM | POA: Diagnosis not present

## 2016-05-01 DIAGNOSIS — Z950 Presence of cardiac pacemaker: Secondary | ICD-10-CM | POA: Diagnosis not present

## 2016-05-01 DIAGNOSIS — C61 Malignant neoplasm of prostate: Secondary | ICD-10-CM | POA: Diagnosis not present

## 2016-05-02 ENCOUNTER — Ambulatory Visit
Admission: RE | Admit: 2016-05-02 | Discharge: 2016-05-02 | Disposition: A | Discharge status: Home / Self Care | Payer: Medicare Other | Source: Ambulatory Visit | Attending: Radiation Oncology | Admitting: Radiation Oncology

## 2016-05-02 DIAGNOSIS — I2510 Atherosclerotic heart disease of native coronary artery without angina pectoris: Secondary | ICD-10-CM | POA: Diagnosis not present

## 2016-05-02 DIAGNOSIS — Z510 Encounter for antineoplastic radiation therapy: Secondary | ICD-10-CM | POA: Diagnosis not present

## 2016-05-02 DIAGNOSIS — Z9889 Other specified postprocedural states: Secondary | ICD-10-CM | POA: Diagnosis not present

## 2016-05-02 DIAGNOSIS — Z720 Tobacco use: Secondary | ICD-10-CM | POA: Diagnosis not present

## 2016-05-02 DIAGNOSIS — E785 Hyperlipidemia, unspecified: Secondary | ICD-10-CM | POA: Diagnosis not present

## 2016-05-02 DIAGNOSIS — I10 Essential (primary) hypertension: Secondary | ICD-10-CM | POA: Diagnosis not present

## 2016-05-02 DIAGNOSIS — E079 Disorder of thyroid, unspecified: Secondary | ICD-10-CM | POA: Diagnosis not present

## 2016-05-02 DIAGNOSIS — I495 Sick sinus syndrome: Secondary | ICD-10-CM | POA: Diagnosis not present

## 2016-05-02 DIAGNOSIS — Z950 Presence of cardiac pacemaker: Secondary | ICD-10-CM | POA: Diagnosis not present

## 2016-05-02 DIAGNOSIS — C61 Malignant neoplasm of prostate: Secondary | ICD-10-CM | POA: Diagnosis not present

## 2016-05-02 DIAGNOSIS — Z87891 Personal history of nicotine dependence: Secondary | ICD-10-CM | POA: Diagnosis not present

## 2016-05-02 DIAGNOSIS — I252 Old myocardial infarction: Secondary | ICD-10-CM | POA: Diagnosis not present

## 2016-05-02 DIAGNOSIS — K219 Gastro-esophageal reflux disease without esophagitis: Secondary | ICD-10-CM | POA: Diagnosis not present

## 2016-05-03 ENCOUNTER — Ambulatory Visit
Admission: RE | Admit: 2016-05-03 | Discharge: 2016-05-03 | Disposition: A | Discharge status: Home / Self Care | Payer: Medicare Other | Source: Ambulatory Visit | Attending: Radiation Oncology | Admitting: Radiation Oncology

## 2016-05-03 ENCOUNTER — Ambulatory Visit (HOSPITAL_COMMUNITY): Payer: Medicare Other | Admitting: Physical Therapy

## 2016-05-03 ENCOUNTER — Telehealth (HOSPITAL_COMMUNITY): Payer: Self-pay | Admitting: Physical Therapy

## 2016-05-03 DIAGNOSIS — Z510 Encounter for antineoplastic radiation therapy: Secondary | ICD-10-CM | POA: Diagnosis not present

## 2016-05-03 DIAGNOSIS — I10 Essential (primary) hypertension: Secondary | ICD-10-CM | POA: Diagnosis not present

## 2016-05-03 DIAGNOSIS — K219 Gastro-esophageal reflux disease without esophagitis: Secondary | ICD-10-CM | POA: Diagnosis not present

## 2016-05-03 DIAGNOSIS — Z720 Tobacco use: Secondary | ICD-10-CM | POA: Diagnosis not present

## 2016-05-03 DIAGNOSIS — I252 Old myocardial infarction: Secondary | ICD-10-CM | POA: Diagnosis not present

## 2016-05-03 DIAGNOSIS — I495 Sick sinus syndrome: Secondary | ICD-10-CM | POA: Diagnosis not present

## 2016-05-03 DIAGNOSIS — I2510 Atherosclerotic heart disease of native coronary artery without angina pectoris: Secondary | ICD-10-CM | POA: Diagnosis not present

## 2016-05-03 DIAGNOSIS — Z9889 Other specified postprocedural states: Secondary | ICD-10-CM | POA: Diagnosis not present

## 2016-05-03 DIAGNOSIS — C61 Malignant neoplasm of prostate: Secondary | ICD-10-CM | POA: Diagnosis not present

## 2016-05-03 DIAGNOSIS — Z950 Presence of cardiac pacemaker: Secondary | ICD-10-CM | POA: Diagnosis not present

## 2016-05-03 DIAGNOSIS — E079 Disorder of thyroid, unspecified: Secondary | ICD-10-CM | POA: Diagnosis not present

## 2016-05-03 DIAGNOSIS — E785 Hyperlipidemia, unspecified: Secondary | ICD-10-CM | POA: Diagnosis not present

## 2016-05-03 DIAGNOSIS — Z87891 Personal history of nicotine dependence: Secondary | ICD-10-CM | POA: Diagnosis not present

## 2016-05-03 NOTE — Telephone Encounter (Signed)
Pt did not show for appointment.  Called residence and spoke to wife who request to take patient off the schedule for now.  Wife states patient has been receiving radiation for his cancer and just cant participate due to extreme fatigue.  Wife or patient will receive new orders when patient is ready to resume.  Message sent to evaluating therapist as well to discharge him at this time Teena Irani, PTA/CLT [phone]

## 2016-05-04 ENCOUNTER — Ambulatory Visit
Admission: RE | Admit: 2016-05-04 | Discharge: 2016-05-04 | Disposition: A | Discharge status: Home / Self Care | Payer: Medicare Other | Source: Ambulatory Visit | Attending: Radiation Oncology | Admitting: Radiation Oncology

## 2016-05-04 ENCOUNTER — Telehealth: Payer: Self-pay | Admitting: *Deleted

## 2016-05-04 DIAGNOSIS — K219 Gastro-esophageal reflux disease without esophagitis: Secondary | ICD-10-CM | POA: Diagnosis not present

## 2016-05-04 DIAGNOSIS — C61 Malignant neoplasm of prostate: Secondary | ICD-10-CM | POA: Diagnosis not present

## 2016-05-04 DIAGNOSIS — Z9889 Other specified postprocedural states: Secondary | ICD-10-CM | POA: Diagnosis not present

## 2016-05-04 DIAGNOSIS — Z510 Encounter for antineoplastic radiation therapy: Secondary | ICD-10-CM | POA: Diagnosis not present

## 2016-05-04 DIAGNOSIS — I10 Essential (primary) hypertension: Secondary | ICD-10-CM | POA: Diagnosis not present

## 2016-05-04 DIAGNOSIS — Z720 Tobacco use: Secondary | ICD-10-CM | POA: Diagnosis not present

## 2016-05-04 DIAGNOSIS — Z87891 Personal history of nicotine dependence: Secondary | ICD-10-CM | POA: Diagnosis not present

## 2016-05-04 DIAGNOSIS — E079 Disorder of thyroid, unspecified: Secondary | ICD-10-CM | POA: Diagnosis not present

## 2016-05-04 DIAGNOSIS — I252 Old myocardial infarction: Secondary | ICD-10-CM | POA: Diagnosis not present

## 2016-05-04 DIAGNOSIS — I495 Sick sinus syndrome: Secondary | ICD-10-CM | POA: Diagnosis not present

## 2016-05-04 DIAGNOSIS — Z950 Presence of cardiac pacemaker: Secondary | ICD-10-CM | POA: Diagnosis not present

## 2016-05-04 DIAGNOSIS — I2510 Atherosclerotic heart disease of native coronary artery without angina pectoris: Secondary | ICD-10-CM | POA: Diagnosis not present

## 2016-05-04 DIAGNOSIS — E785 Hyperlipidemia, unspecified: Secondary | ICD-10-CM | POA: Diagnosis not present

## 2016-05-04 MED ORDER — OXYCODONE-ACETAMINOPHEN 5-325 MG PO TABS: 1.0000 | ORAL_TABLET | Freq: Four times a day (QID) | ORAL | 0 refills | Status: DC | PRN

## 2016-05-04 NOTE — Telephone Encounter (Signed)
Prescription printed and patient made aware to come to office to pick up on 05/05/2016.

## 2016-05-04 NOTE — Telephone Encounter (Signed)
He has cancer. Approved. For same quantity his last Rx.

## 2016-05-04 NOTE — Telephone Encounter (Signed)
Received call from patient.   Requested refill on Oycodone.   Ok to refill??  Last office visit 03/10/2016.  Last refill 04/10/2016.

## 2016-05-05 ENCOUNTER — Other Ambulatory Visit: Payer: Self-pay | Admitting: Family Medicine

## 2016-05-05 ENCOUNTER — Ambulatory Visit
Admission: RE | Admit: 2016-05-05 | Discharge: 2016-05-05 | Disposition: A | Discharge status: Home / Self Care | Payer: Medicare Other | Source: Ambulatory Visit | Attending: Radiation Oncology | Admitting: Radiation Oncology

## 2016-05-05 ENCOUNTER — Encounter: Payer: Self-pay | Admitting: Radiation Oncology

## 2016-05-05 ENCOUNTER — Other Ambulatory Visit: Payer: Self-pay

## 2016-05-05 VITALS — BP 108/84 | HR 69 | Temp 98.4°F | Resp 20 | Wt 295.4 lb

## 2016-05-05 DIAGNOSIS — I2510 Atherosclerotic heart disease of native coronary artery without angina pectoris: Secondary | ICD-10-CM | POA: Diagnosis not present

## 2016-05-05 DIAGNOSIS — E079 Disorder of thyroid, unspecified: Secondary | ICD-10-CM | POA: Diagnosis not present

## 2016-05-05 DIAGNOSIS — C61 Malignant neoplasm of prostate: Secondary | ICD-10-CM | POA: Diagnosis not present

## 2016-05-05 DIAGNOSIS — Z9889 Other specified postprocedural states: Secondary | ICD-10-CM | POA: Diagnosis not present

## 2016-05-05 DIAGNOSIS — Z87891 Personal history of nicotine dependence: Secondary | ICD-10-CM | POA: Diagnosis not present

## 2016-05-05 DIAGNOSIS — I495 Sick sinus syndrome: Secondary | ICD-10-CM | POA: Diagnosis not present

## 2016-05-05 DIAGNOSIS — Z720 Tobacco use: Secondary | ICD-10-CM | POA: Diagnosis not present

## 2016-05-05 DIAGNOSIS — Z950 Presence of cardiac pacemaker: Secondary | ICD-10-CM | POA: Diagnosis not present

## 2016-05-05 DIAGNOSIS — Z510 Encounter for antineoplastic radiation therapy: Secondary | ICD-10-CM | POA: Diagnosis not present

## 2016-05-05 DIAGNOSIS — I10 Essential (primary) hypertension: Secondary | ICD-10-CM | POA: Diagnosis not present

## 2016-05-05 DIAGNOSIS — K219 Gastro-esophageal reflux disease without esophagitis: Secondary | ICD-10-CM | POA: Diagnosis not present

## 2016-05-05 DIAGNOSIS — I252 Old myocardial infarction: Secondary | ICD-10-CM | POA: Diagnosis not present

## 2016-05-05 DIAGNOSIS — E785 Hyperlipidemia, unspecified: Secondary | ICD-10-CM | POA: Diagnosis not present

## 2016-05-05 NOTE — Progress Notes (Signed)
  Radiation Oncology         [phone]   Name: Peter Dunlap MRN: [id number]   Date: 05/05/2016  DOB: 03/14/1956   Weekly Radiation Therapy Management    ICD-9-CM ICD-10-CM   1. Prostate cancer ([place]) 185 C61     Current Dose: 25.35 Gy  Planned Dose:  78 Gy  Narrative The patient presents for routine under treatment assessment.  After the weekly undertreatment encounter on 04/23/16, the patient presented to the ED for extreme left testicular pain. US of the testes revealed extensive epididymitis on the left with hyperemia and enlargement/edema of the epididymis on the left (particularly involving the tail region), scrotal wall thickening on the left with moderate left-sided hydrocele, a 2 mm calcification in the left testis (a finding most likely residua of old trauma or possibly residua of prior infection), no intratesticular mass or evidence of testicular torsion on either side, and no orchitis evident. Doppler of both testes demonstrated normal low resistance arterial and venous waveforms bilaterally. A urine sample was obtained and the patient was Trichomonas positive, but the attending at the time believed it was an error since the patient is unable to get an erection secondary to his prostate cancer and cardiovascular disease. Urine was sent to culture and no growth was observed. The patient's Percocet dose was increased. Medications ordered in the ED were Dilaudid, Zofran, Rocephin, Zithromax, and Flagyl. The patient was subsequently discharged the same day.  Weekly rad txs prostate 14/40 completed. He denies dysuria or hematuria. He has good stream, but occasionally doesn't empty fully and goes back in a couple minutes to finish. Nocturia x 2, takes rapaflow daily. He still has left testicular pain and swelling, but the pain is less now. He has diarrhea every time he eats or drinks anything and started having blood in stool for the past couple of days. Mild fatigue occasionally, but  stated it's better this week. Appetite down and he reports a metallic taste as well.   Set-up films were reviewed. The chart was checked.  Physical Findings  weight is 295 lb 6.4 oz (134 kg). His oral temperature is 98.4 F (36.9 C). His blood pressure is 108/84 and his pulse is 69. His respiration is 20.  The patient is ambulatory with a cane. The patient has lost 14 lbs since 03/10/16.   Impression The patient still reports left testicular pain and swelling, but the pain has improved.  Plan Continue treatment as planned. I advised the patient to take Imodium for his diarrhea and to eat yogurt for good bacterial cultures.         Sheral Apley Tammi Klippel, M.D.  This document serves as a record of services personally performed by Tyler Pita, MD. It was created on his behalf by Darcus Austin, a trained medical scribe. The creation of this record is based on the scribe's personal observations and the provider's statements to them. This document has been checked and approved by the attending provider.

## 2016-05-05 NOTE — Patient Outreach (Signed)
[facility]) Care Management  05/05/2016  JAHAN SEABOLD 23-Jul-1955 [id number]   Telephone call to patient for monthly call. Patient reports he is about to go in for his radiation treatment.  Advised patient health coach would call another time.  Plan: RN Health Coach will attempt patient again in the month of December.   [name], RN, MSN [place] [phone]

## 2016-05-05 NOTE — Progress Notes (Addendum)
Weekly rad txs prostate 14/40 completed, no dysuria, no hematuria, has good stream but occasionally doesn't empty fully and comes back in a couple minutes to finish, nocturia x 2, takes rapaflow daily, stillhas knot on his testivcle but not much pain stated;  having diarrhea  Every time he eats or drinks anything and started having blood in stool  Past couple days, mild fatigue occasionally but stated better this week, appetite down, has metallic taste as well [id number] AM BP 106/71 (BP Location: Left Arm, Patient Position: Sitting, Cuff Size: Large)   Pulse 63   Temp 98.4 F (36.9 C) (Oral)   Resp 20   Wt 295 lb 6.4 oz (134 kg)   BMI 40.06 kg/m   Wt Readings from Last 3 Encounters:  05/05/16 295 lb 6.4 oz (134 kg)  04/23/16 (!) 303 lb (137.4 kg)  04/20/16 (!) 302 lb 6.4 oz (137.2 kg)   BP 108/84 (BP Location: Left Arm, Patient Position: Standing, Cuff Size: Large)   Pulse 69   Temp 98.4 F (36.9 C) (Oral)   Resp 20   Wt 295 lb 6.4 oz (134 kg)   BMI 40.06 kg/m

## 2016-05-08 ENCOUNTER — Ambulatory Visit
Admission: RE | Admit: 2016-05-08 | Discharge: 2016-05-08 | Disposition: A | Discharge status: Home / Self Care | Payer: Medicare Other | Source: Ambulatory Visit | Attending: Radiation Oncology | Admitting: Radiation Oncology

## 2016-05-08 DIAGNOSIS — E785 Hyperlipidemia, unspecified: Secondary | ICD-10-CM | POA: Diagnosis not present

## 2016-05-08 DIAGNOSIS — I495 Sick sinus syndrome: Secondary | ICD-10-CM | POA: Diagnosis not present

## 2016-05-08 DIAGNOSIS — I252 Old myocardial infarction: Secondary | ICD-10-CM | POA: Diagnosis not present

## 2016-05-08 DIAGNOSIS — Z87891 Personal history of nicotine dependence: Secondary | ICD-10-CM | POA: Diagnosis not present

## 2016-05-08 DIAGNOSIS — Z720 Tobacco use: Secondary | ICD-10-CM | POA: Diagnosis not present

## 2016-05-08 DIAGNOSIS — I10 Essential (primary) hypertension: Secondary | ICD-10-CM | POA: Diagnosis not present

## 2016-05-08 DIAGNOSIS — I2510 Atherosclerotic heart disease of native coronary artery without angina pectoris: Secondary | ICD-10-CM | POA: Diagnosis not present

## 2016-05-08 DIAGNOSIS — Z9889 Other specified postprocedural states: Secondary | ICD-10-CM | POA: Diagnosis not present

## 2016-05-08 DIAGNOSIS — E079 Disorder of thyroid, unspecified: Secondary | ICD-10-CM | POA: Diagnosis not present

## 2016-05-08 DIAGNOSIS — K219 Gastro-esophageal reflux disease without esophagitis: Secondary | ICD-10-CM | POA: Diagnosis not present

## 2016-05-08 DIAGNOSIS — Z950 Presence of cardiac pacemaker: Secondary | ICD-10-CM | POA: Diagnosis not present

## 2016-05-08 DIAGNOSIS — C61 Malignant neoplasm of prostate: Secondary | ICD-10-CM | POA: Diagnosis not present

## 2016-05-08 DIAGNOSIS — Z510 Encounter for antineoplastic radiation therapy: Secondary | ICD-10-CM | POA: Diagnosis not present

## 2016-05-09 ENCOUNTER — Ambulatory Visit
Admission: RE | Admit: 2016-05-09 | Discharge: 2016-05-09 | Disposition: A | Discharge status: Home / Self Care | Payer: Medicare Other | Source: Ambulatory Visit | Attending: Radiation Oncology | Admitting: Radiation Oncology

## 2016-05-09 DIAGNOSIS — E079 Disorder of thyroid, unspecified: Secondary | ICD-10-CM | POA: Diagnosis not present

## 2016-05-09 DIAGNOSIS — Z9889 Other specified postprocedural states: Secondary | ICD-10-CM | POA: Diagnosis not present

## 2016-05-09 DIAGNOSIS — I2510 Atherosclerotic heart disease of native coronary artery without angina pectoris: Secondary | ICD-10-CM | POA: Diagnosis not present

## 2016-05-09 DIAGNOSIS — K219 Gastro-esophageal reflux disease without esophagitis: Secondary | ICD-10-CM | POA: Diagnosis not present

## 2016-05-09 DIAGNOSIS — C61 Malignant neoplasm of prostate: Secondary | ICD-10-CM | POA: Diagnosis not present

## 2016-05-09 DIAGNOSIS — Z950 Presence of cardiac pacemaker: Secondary | ICD-10-CM | POA: Diagnosis not present

## 2016-05-09 DIAGNOSIS — Z510 Encounter for antineoplastic radiation therapy: Secondary | ICD-10-CM | POA: Diagnosis not present

## 2016-05-09 DIAGNOSIS — E785 Hyperlipidemia, unspecified: Secondary | ICD-10-CM | POA: Diagnosis not present

## 2016-05-09 DIAGNOSIS — Z87891 Personal history of nicotine dependence: Secondary | ICD-10-CM | POA: Diagnosis not present

## 2016-05-09 DIAGNOSIS — I10 Essential (primary) hypertension: Secondary | ICD-10-CM | POA: Diagnosis not present

## 2016-05-09 DIAGNOSIS — Z720 Tobacco use: Secondary | ICD-10-CM | POA: Diagnosis not present

## 2016-05-09 DIAGNOSIS — I252 Old myocardial infarction: Secondary | ICD-10-CM | POA: Diagnosis not present

## 2016-05-09 DIAGNOSIS — I495 Sick sinus syndrome: Secondary | ICD-10-CM | POA: Diagnosis not present

## 2016-05-10 ENCOUNTER — Other Ambulatory Visit: Payer: Self-pay | Admitting: Internal Medicine

## 2016-05-10 ENCOUNTER — Ambulatory Visit (HOSPITAL_COMMUNITY): Payer: Medicare Other

## 2016-05-10 ENCOUNTER — Ambulatory Visit
Admission: RE | Admit: 2016-05-10 | Discharge: 2016-05-10 | Disposition: A | Discharge status: Home / Self Care | Payer: Medicare Other | Source: Ambulatory Visit | Attending: Radiation Oncology | Admitting: Radiation Oncology

## 2016-05-10 DIAGNOSIS — Z87891 Personal history of nicotine dependence: Secondary | ICD-10-CM | POA: Diagnosis not present

## 2016-05-10 DIAGNOSIS — Z720 Tobacco use: Secondary | ICD-10-CM | POA: Diagnosis not present

## 2016-05-10 DIAGNOSIS — E079 Disorder of thyroid, unspecified: Secondary | ICD-10-CM | POA: Diagnosis not present

## 2016-05-10 DIAGNOSIS — K219 Gastro-esophageal reflux disease without esophagitis: Secondary | ICD-10-CM | POA: Diagnosis not present

## 2016-05-10 DIAGNOSIS — E785 Hyperlipidemia, unspecified: Secondary | ICD-10-CM | POA: Diagnosis not present

## 2016-05-10 DIAGNOSIS — I252 Old myocardial infarction: Secondary | ICD-10-CM | POA: Diagnosis not present

## 2016-05-10 DIAGNOSIS — I10 Essential (primary) hypertension: Secondary | ICD-10-CM | POA: Diagnosis not present

## 2016-05-10 DIAGNOSIS — Z9889 Other specified postprocedural states: Secondary | ICD-10-CM | POA: Diagnosis not present

## 2016-05-10 DIAGNOSIS — I495 Sick sinus syndrome: Secondary | ICD-10-CM | POA: Diagnosis not present

## 2016-05-10 DIAGNOSIS — Z950 Presence of cardiac pacemaker: Secondary | ICD-10-CM | POA: Diagnosis not present

## 2016-05-10 DIAGNOSIS — Z510 Encounter for antineoplastic radiation therapy: Secondary | ICD-10-CM | POA: Diagnosis not present

## 2016-05-10 DIAGNOSIS — C61 Malignant neoplasm of prostate: Secondary | ICD-10-CM | POA: Diagnosis not present

## 2016-05-10 DIAGNOSIS — I2510 Atherosclerotic heart disease of native coronary artery without angina pectoris: Secondary | ICD-10-CM | POA: Diagnosis not present

## 2016-05-11 ENCOUNTER — Ambulatory Visit
Admission: RE | Admit: 2016-05-11 | Discharge: 2016-05-11 | Disposition: A | Discharge status: Home / Self Care | Payer: Medicare Other | Source: Ambulatory Visit | Attending: Radiation Oncology | Admitting: Radiation Oncology

## 2016-05-11 ENCOUNTER — Other Ambulatory Visit: Payer: Self-pay

## 2016-05-11 VITALS — BP 100/59 | HR 68 | Resp 18 | Wt 305.8 lb

## 2016-05-11 DIAGNOSIS — Z510 Encounter for antineoplastic radiation therapy: Secondary | ICD-10-CM | POA: Diagnosis not present

## 2016-05-11 DIAGNOSIS — I252 Old myocardial infarction: Secondary | ICD-10-CM | POA: Diagnosis not present

## 2016-05-11 DIAGNOSIS — K219 Gastro-esophageal reflux disease without esophagitis: Secondary | ICD-10-CM | POA: Diagnosis not present

## 2016-05-11 DIAGNOSIS — Z720 Tobacco use: Secondary | ICD-10-CM | POA: Diagnosis not present

## 2016-05-11 DIAGNOSIS — I495 Sick sinus syndrome: Secondary | ICD-10-CM | POA: Diagnosis not present

## 2016-05-11 DIAGNOSIS — Z87891 Personal history of nicotine dependence: Secondary | ICD-10-CM | POA: Diagnosis not present

## 2016-05-11 DIAGNOSIS — C61 Malignant neoplasm of prostate: Secondary | ICD-10-CM

## 2016-05-11 DIAGNOSIS — Z950 Presence of cardiac pacemaker: Secondary | ICD-10-CM | POA: Diagnosis not present

## 2016-05-11 DIAGNOSIS — E079 Disorder of thyroid, unspecified: Secondary | ICD-10-CM | POA: Diagnosis not present

## 2016-05-11 DIAGNOSIS — I2510 Atherosclerotic heart disease of native coronary artery without angina pectoris: Secondary | ICD-10-CM | POA: Diagnosis not present

## 2016-05-11 DIAGNOSIS — E785 Hyperlipidemia, unspecified: Secondary | ICD-10-CM | POA: Diagnosis not present

## 2016-05-11 DIAGNOSIS — Z9889 Other specified postprocedural states: Secondary | ICD-10-CM | POA: Diagnosis not present

## 2016-05-11 DIAGNOSIS — I10 Essential (primary) hypertension: Secondary | ICD-10-CM | POA: Diagnosis not present

## 2016-05-11 NOTE — Progress Notes (Signed)
Weight and vitals stable. Reports left ankle pain 5 on a scale of 0-10 related to effects of gout. Reports he resume taking colchicine last night. Reports dysuria is mild now. Reports hematuria has resolved. Reports nocturia x 2-3. Reports on occasion he will experience a very strong urge to void with very little output. Reports left testicle remain swollen such that he has to "move it out of the way" void to void. Reports diarrhea.   BP (!) 100/59 (BP Location: Right Arm, Patient Position: Sitting, Cuff Size: Large)   Pulse 68   Resp 18   Wt (!) 305 lb 12.8 oz (138.7 kg)   SpO2 100%   BMI 41.47 kg/m  Wt Readings from Last 3 Encounters:  05/11/16 (!) 305 lb 12.8 oz (138.7 kg)  05/05/16 295 lb 6.4 oz (134 kg)  04/23/16 (!) 303 lb (137.4 kg)

## 2016-05-11 NOTE — Patient Outreach (Signed)
[facility]) Care Management  05/11/2016  Peter Dunlap 1956-03-17 [id number]   Telephone call to patient for monthly call. No answer.  HIPAA compliant voice message left.  Plan: RN Health Coach will attempt patient again in the month of December.  [name], RN, MSN [place] [phone]

## 2016-05-11 NOTE — Progress Notes (Signed)
  Radiation Oncology         [phone]   Name: Peter Dunlap MRN: [id number]   Date: 05/11/2016  DOB: [birth year]   Weekly Radiation Therapy Management    ICD-9-CM ICD-10-CM   1. Prostate cancer ([place]) 185 C61     Current Dose: 35.1 Gy  Planned Dose:  78 Gy  Narrative The patient presents for routine under treatment assessment.  Weight and vitals stable. Reports left ankle pain 5 on a scale of 0-10 related to effects of gout. Reports he resume taking colchicine last night. Reports dysuria is mild now. Reports hematuria has resolved. Reports nocturia x 2-3. Reports on occasion he will experience a very strong urge to void with very little output. Reports left testicle remain swollen such that he has to "move it out of the way" to void. Reports diarrhea.   Set-up films were reviewed. The chart was checked.  Physical Findings  weight is 305 lb 12.8 oz (138.7 kg) (abnormal). His blood pressure is 100/59 (abnormal) and his pulse is 68. His respiration is 18 and oxygen saturation is 100%.   Weight is essentially stable. Uses cane to ambulate. Alert, in no acute distress.   Impression The patient still reports left testicular swelling but less pain and it is stable.  Plan Continue treatment as planned.         Sheral Apley Tammi Klippel, M.D.  This document serves as a record of services personally performed by Tyler Pita, MD. It was created on his behalf by Arlyce Harman, a trained medical scribe. The creation of this record is based on the scribe's personal observations and the provider's statements to them. This document has been checked and approved by the attending provider.

## 2016-05-12 ENCOUNTER — Ambulatory Visit
Admission: RE | Admit: 2016-05-12 | Discharge: 2016-05-12 | Disposition: A | Discharge status: Home / Self Care | Payer: Medicare Other | Source: Ambulatory Visit | Attending: Radiation Oncology | Admitting: Radiation Oncology

## 2016-05-12 DIAGNOSIS — C61 Malignant neoplasm of prostate: Secondary | ICD-10-CM | POA: Diagnosis not present

## 2016-05-12 DIAGNOSIS — I495 Sick sinus syndrome: Secondary | ICD-10-CM | POA: Diagnosis not present

## 2016-05-12 DIAGNOSIS — E785 Hyperlipidemia, unspecified: Secondary | ICD-10-CM | POA: Diagnosis not present

## 2016-05-12 DIAGNOSIS — K219 Gastro-esophageal reflux disease without esophagitis: Secondary | ICD-10-CM | POA: Diagnosis not present

## 2016-05-12 DIAGNOSIS — Z950 Presence of cardiac pacemaker: Secondary | ICD-10-CM | POA: Diagnosis not present

## 2016-05-12 DIAGNOSIS — Z720 Tobacco use: Secondary | ICD-10-CM | POA: Diagnosis not present

## 2016-05-12 DIAGNOSIS — Z87891 Personal history of nicotine dependence: Secondary | ICD-10-CM | POA: Diagnosis not present

## 2016-05-12 DIAGNOSIS — I2510 Atherosclerotic heart disease of native coronary artery without angina pectoris: Secondary | ICD-10-CM | POA: Diagnosis not present

## 2016-05-12 DIAGNOSIS — Z510 Encounter for antineoplastic radiation therapy: Secondary | ICD-10-CM | POA: Diagnosis not present

## 2016-05-12 DIAGNOSIS — I252 Old myocardial infarction: Secondary | ICD-10-CM | POA: Diagnosis not present

## 2016-05-12 DIAGNOSIS — I10 Essential (primary) hypertension: Secondary | ICD-10-CM | POA: Diagnosis not present

## 2016-05-12 DIAGNOSIS — Z9889 Other specified postprocedural states: Secondary | ICD-10-CM | POA: Diagnosis not present

## 2016-05-12 DIAGNOSIS — E079 Disorder of thyroid, unspecified: Secondary | ICD-10-CM | POA: Diagnosis not present

## 2016-05-14 ENCOUNTER — Other Ambulatory Visit: Payer: Self-pay | Admitting: "Endocrinology

## 2016-05-15 ENCOUNTER — Encounter: Payer: Self-pay | Admitting: Medical Oncology

## 2016-05-15 ENCOUNTER — Ambulatory Visit
Admission: RE | Admit: 2016-05-15 | Discharge: 2016-05-15 | Disposition: A | Discharge status: Home / Self Care | Payer: Medicare Other | Source: Ambulatory Visit | Attending: Radiation Oncology | Admitting: Radiation Oncology

## 2016-05-15 DIAGNOSIS — I10 Essential (primary) hypertension: Secondary | ICD-10-CM | POA: Diagnosis not present

## 2016-05-15 DIAGNOSIS — E079 Disorder of thyroid, unspecified: Secondary | ICD-10-CM | POA: Diagnosis not present

## 2016-05-15 DIAGNOSIS — C61 Malignant neoplasm of prostate: Secondary | ICD-10-CM | POA: Diagnosis not present

## 2016-05-15 DIAGNOSIS — Z510 Encounter for antineoplastic radiation therapy: Secondary | ICD-10-CM | POA: Diagnosis not present

## 2016-05-15 DIAGNOSIS — E785 Hyperlipidemia, unspecified: Secondary | ICD-10-CM | POA: Diagnosis not present

## 2016-05-15 DIAGNOSIS — I495 Sick sinus syndrome: Secondary | ICD-10-CM | POA: Diagnosis not present

## 2016-05-15 DIAGNOSIS — I252 Old myocardial infarction: Secondary | ICD-10-CM | POA: Diagnosis not present

## 2016-05-15 DIAGNOSIS — Z950 Presence of cardiac pacemaker: Secondary | ICD-10-CM | POA: Diagnosis not present

## 2016-05-15 DIAGNOSIS — K219 Gastro-esophageal reflux disease without esophagitis: Secondary | ICD-10-CM | POA: Diagnosis not present

## 2016-05-15 DIAGNOSIS — I2510 Atherosclerotic heart disease of native coronary artery without angina pectoris: Secondary | ICD-10-CM | POA: Diagnosis not present

## 2016-05-15 DIAGNOSIS — Z9889 Other specified postprocedural states: Secondary | ICD-10-CM | POA: Diagnosis not present

## 2016-05-15 DIAGNOSIS — Z87891 Personal history of nicotine dependence: Secondary | ICD-10-CM | POA: Diagnosis not present

## 2016-05-15 DIAGNOSIS — Z720 Tobacco use: Secondary | ICD-10-CM | POA: Diagnosis not present

## 2016-05-16 ENCOUNTER — Encounter (INDEPENDENT_AMBULATORY_CARE_PROVIDER_SITE_OTHER): Payer: Self-pay

## 2016-05-16 ENCOUNTER — Encounter: Payer: Self-pay | Admitting: Internal Medicine

## 2016-05-16 ENCOUNTER — Ambulatory Visit
Admission: RE | Admit: 2016-05-16 | Discharge: 2016-05-16 | Disposition: A | Discharge status: Home / Self Care | Payer: Medicare Other | Source: Ambulatory Visit | Attending: Radiation Oncology | Admitting: Radiation Oncology

## 2016-05-16 ENCOUNTER — Ambulatory Visit (INDEPENDENT_AMBULATORY_CARE_PROVIDER_SITE_OTHER): Payer: Medicare Other | Admitting: Internal Medicine

## 2016-05-16 VITALS — BP 104/62 | HR 78 | Ht 72.0 in | Wt 306.8 lb

## 2016-05-16 DIAGNOSIS — Z950 Presence of cardiac pacemaker: Secondary | ICD-10-CM | POA: Diagnosis not present

## 2016-05-16 DIAGNOSIS — K219 Gastro-esophageal reflux disease without esophagitis: Secondary | ICD-10-CM | POA: Diagnosis not present

## 2016-05-16 DIAGNOSIS — E079 Disorder of thyroid, unspecified: Secondary | ICD-10-CM | POA: Diagnosis not present

## 2016-05-16 DIAGNOSIS — I2510 Atherosclerotic heart disease of native coronary artery without angina pectoris: Secondary | ICD-10-CM | POA: Diagnosis not present

## 2016-05-16 DIAGNOSIS — I495 Sick sinus syndrome: Secondary | ICD-10-CM | POA: Diagnosis not present

## 2016-05-16 DIAGNOSIS — Z510 Encounter for antineoplastic radiation therapy: Secondary | ICD-10-CM | POA: Diagnosis not present

## 2016-05-16 DIAGNOSIS — E785 Hyperlipidemia, unspecified: Secondary | ICD-10-CM | POA: Diagnosis not present

## 2016-05-16 DIAGNOSIS — I10 Essential (primary) hypertension: Secondary | ICD-10-CM | POA: Diagnosis not present

## 2016-05-16 DIAGNOSIS — I252 Old myocardial infarction: Secondary | ICD-10-CM | POA: Diagnosis not present

## 2016-05-16 DIAGNOSIS — Z79899 Other long term (current) drug therapy: Secondary | ICD-10-CM

## 2016-05-16 DIAGNOSIS — Z720 Tobacco use: Secondary | ICD-10-CM | POA: Diagnosis not present

## 2016-05-16 DIAGNOSIS — Z9889 Other specified postprocedural states: Secondary | ICD-10-CM | POA: Diagnosis not present

## 2016-05-16 DIAGNOSIS — Z87891 Personal history of nicotine dependence: Secondary | ICD-10-CM | POA: Diagnosis not present

## 2016-05-16 DIAGNOSIS — C61 Malignant neoplasm of prostate: Secondary | ICD-10-CM | POA: Diagnosis not present

## 2016-05-16 LAB — CUP PACEART INCLINIC DEVICE CHECK
Battery Voltage: 2.99 V
Brady Statistic RA Percent Paced: 8.2 %
Brady Statistic RV Percent Paced: 17 %
Date Time Interrogation Session: 20171212151737
Implantable Lead Implant Date: 20160415
Implantable Lead Implant Date: 20160415
Implantable Lead Location: 753859
Implantable Lead Location: 753860
Implantable Pulse Generator Implant Date: 20160415
Lead Channel Impedance Value: 450 Ohm
Lead Channel Impedance Value: 600 Ohm
Lead Channel Pacing Threshold Amplitude: 0.75 V
Lead Channel Pacing Threshold Pulse Width: 0.5 ms
Lead Channel Sensing Intrinsic Amplitude: 12 mV
Lead Channel Sensing Intrinsic Amplitude: 5 mV
Lead Channel Setting Pacing Amplitude: 2 V
Lead Channel Setting Pacing Amplitude: 2.5 V
Lead Channel Setting Pacing Pulse Width: 0.5 ms
Lead Channel Setting Sensing Sensitivity: 2 mV
Pulse Gen Model: 2240
Pulse Gen Serial Number: 7756161

## 2016-05-16 MED ORDER — SOTALOL HCL 160 MG PO TABS: 160.0000 mg | ORAL_TABLET | Freq: Two times a day (BID) | ORAL | 6 refills | Status: DC

## 2016-05-16 NOTE — Patient Instructions (Addendum)
Medication Instructions:  Your physician has recommended you make the following change in your medication:  1) STOP Metoprolol  2) INCREASE Sotalol to 160 mg twice a day    Labwork: None Ordered   Testing/Procedures: EKG in 2 weeks (nurse visit)   Follow-Up: Your physician wants you to follow-up in: 6 months in [facility] and 1 year with Dr. Lovena Le. You will receive a reminder letter in the mail two months in advance. If you don't receive a letter, please call our office to schedule the follow-up appointment.    Any Other Special Instructions Will Be Listed Below (If Applicable).     If you need a refill on your cardiac medications before your next appointment, please call your pharmacy.

## 2016-05-16 NOTE — Progress Notes (Signed)
HPI Peter Dunlap returns today for followup. He is a pleasant middle aged man with a h/o tobacco abuse, SVT, s/p catheter ablation, HTN, symptomatic bradycardia, s/p PPM and paroxysmal atrial fibrillation and CAD. His dizzy spells have resolved since his PPM was placed. His palpitations have remained and may be a bit worse. He is not on systemic anti-coag due to a h/o falls with a subdural hematoma, s/p evacuation and has undergone successful Watchman procedure.  Allergies  Allergen Reactions  . Lactose Intolerance (Gi) Other (See Comments)    UPSET STOMACH      Current Outpatient Prescriptions  Medication Sig Dispense Refill  . allopurinol (ZYLOPRIM) 100 MG tablet TAKE 1 TABLET BY MOUTH EVERY DAY 90 tablet 6  . aspirin EC 325 MG EC tablet Take 1 tablet (325 mg total) by mouth daily.    . colchicine 0.6 MG tablet Take 1 tablet (0.6 mg total) by mouth daily. 15 tablet 0  . DEXILANT 60 MG capsule TAKE 1 CAPSULE BY MOUTH EVERY DAY 30 capsule 0  . furosemide (LASIX) 20 MG tablet TAKE 3 TABLETS BY MOUTH DAILY 90 tablet 9  . isosorbide mononitrate (IMDUR) 60 MG 24 hr tablet TAKE 1 TABLET BY MOUTH EVERY DAY 90 tablet 2  . levothyroxine (SYNTHROID, LEVOTHROID) 200 MCG tablet Take 1 tablet (200 mcg total) by mouth daily before breakfast. 30 tablet 6  . Magnesium Oxide 400 MG CAPS Take 1 capsule (400 mg total) by mouth 2 (two) times daily. 60 capsule 11  . metFORMIN (GLUCOPHAGE) 500 MG tablet TAKE 1 TABLET(500 MG) BY MOUTH TWICE DAILY WITH A MEAL (Patient taking differently: TAKE 1 TABLET(500 MG) BY MOUTH ONCE DAILY WITH A MEAL) 60 tablet 2  . nitroGLYCERIN (NITROSTAT) 0.4 MG SL tablet PLACE 1 TABLET UNDER THE TONGUE EVERY 5 MINUTES AS NEEDED FOR CHEST PAIN 25 tablet 1  . ONE TOUCH ULTRA TEST test strip USE TO TEST ONCE DAILY 50 each 11  . oxyCODONE-acetaminophen (ROXICET) 5-325 MG tablet Take 1 tablet by mouth every 6 (six) hours as needed for severe pain. 60 tablet 0  . potassium chloride SA  (K-DUR,KLOR-CON) 20 MEQ tablet TAKE 1 TABLET BY MOUTH EVERY DAY 30 tablet 6  . RAPAFLO 8 MG CAPS capsule TAKE 1 CAPSULE(8 MG) BY MOUTH DAILY 30 capsule 6  . simvastatin (ZOCOR) 40 MG tablet TAKE 1 TABLET(40 MG) BY MOUTH DAILY 30 tablet 0  . traZODone (DESYREL) 50 MG tablet TAKE 1/2 TO 1 TABLET BY MOUTH AT BEDTIME AS NEEDED FOR SLEEP 30 tablet 3  . sotalol (BETAPACE) 160 MG tablet Take 1 tablet (160 mg total) by mouth 2 (two) times daily. 60 tablet 6   No current facility-administered medications for this visit.    Facility-Administered Medications Ordered in Other Visits  Medication Dose Route Frequency Provider Last Rate Last Dose  . 0.9 %  sodium chloride infusion   Intravenous Continuous [name], MD         Past Medical History:  Diagnosis Date  . Arteriosclerotic cardiovascular disease (ASCVD) 2005   catheterization in 10/2010:50% mid LAD, diffuse distal disease, circumflex irregularities, large dominant RCA with a 50% ostial, 70% distal, 60% posterolateral and 70% PDA; normal EF  . Arthritis   . Benign prostatic hypertrophy   . Cerebrovascular disease 2010   R. carotid endarterectomy; Duplex in 10/2010-widely patent ICAs, subtotal left vertebral-not thought to be contributing to symptoms  . Cervical spine disease    CT in 2012-advanced degeneration  and spondylosis with moderate spinal stenosis at C3-C6  . Depression   . Erectile dysfunction   . Gastroesophageal reflux disease   . H/O hiatal hernia   . H/O: substance abuse    Cocaine, marijuana, alcohol.  Quit 2013.   [name] Hyperlipidemia   . Hypertension   . Non-ST elevation myocardial infarction (NSTEMI), initial episode of care [facility]) 12/02/2013   DES LAD  . Obesity   . Prostate cancer ([place])   . Sleep apnea    CPAP  . Tachy-brady syndrome ([place])    a. s/p STJ dual chamber PPM   . Thyroid disease   . Tobacco abuse    Quit 2014    ROS:   All systems reviewed and negative except as noted in the HPI.   Past  Surgical History:  Procedure Laterality Date  . BRAIN SURGERY  2015   hematoma evacuation  . BURR HOLE Right 04/13/2014   Procedure: Haskell Flirt;  Surgeon: Charlie Pitter, MD;  Location: [place] NEURO ORS;  Service: Neurosurgery;  Laterality: Right;  . CAROTID ENDARTERECTOMY Right Feb. 25, 2010    CEA  . CORONARY ANGIOPLASTY WITH STENT PLACEMENT  12/03/2013   LAD 90%-->0% W/ Promus Premier DES 3.0 mm x 16 mm, CFX OK, RCA 40%, EF 70-75%  . LEFT ATRIAL APPENDAGE OCCLUSION N/A 08/05/2015   Procedure: LEFT ATRIAL APPENDAGE OCCLUSION;  Surgeon: [name], MD;  Location: [place] CV LAB;  Service: Cardiovascular;  Laterality: N/A;  . LEFT HEART CATHETERIZATION WITH CORONARY ANGIOGRAM Left 12/03/2013   Procedure: LEFT HEART CATHETERIZATION WITH CORONARY ANGIOGRAM;  Surgeon: Leonie Man, MD;  [facility] CATH LAB;  Service: Cardiovascular;  Laterality: Left;  . LEFT HEART CATHETERIZATION WITH CORONARY ANGIOGRAM N/A 01/26/2014   Procedure: LEFT HEART CATHETERIZATION WITH CORONARY ANGIOGRAM;  Surgeon: Jettie Booze, MD;  Location: [facility] CATH LAB;  Service: Cardiovascular;  Laterality: N/A;  . LEFT HEART CATHETERIZATION WITH CORONARY ANGIOGRAM N/A 08/03/2014   Procedure: LEFT HEART CATHETERIZATION WITH CORONARY ANGIOGRAM;  Surgeon: Burnell Blanks, MD;  Location: [facility] CATH LAB;  Service: Cardiovascular;  Laterality: N/A;  . PERCUTANEOUS CORONARY STENT INTERVENTION (PCI-S)  12/03/2013   Procedure: PERCUTANEOUS CORONARY STENT INTERVENTION (PCI-S);  Surgeon: Leonie Man, MD;  Location: [facility] CATH LAB;  Service: Cardiovascular;;  . PERMANENT PACEMAKER INSERTION N/A 09/18/2014   Procedure: PERMANENT PACEMAKER INSERTION;  Surgeon: [name], MD;  Location: [facility] CATH LAB;  Service: Cardiovascular;  Laterality: N/A;  . RADIOFREQUENCY ABLATION  2005   for PSVT  . TEE WITHOUT CARDIOVERSION N/A 07/27/2015   Procedure: TRANSESOPHAGEAL ECHOCARDIOGRAM (TEE);  Surgeon: Lelon Perla, MD;  Location[facility]  ENDOSCOPY;  Service: Cardiovascular;  Laterality: N/A;  . TEE WITHOUT CARDIOVERSION N/A 09/15/2015   Procedure: TRANSESOPHAGEAL ECHOCARDIOGRAM (TEE);  Surgeon: Thayer Headings, MD;  Location: [facility] ENDOSCOPY;  Service: Cardiovascular;  Laterality: N/A;     Family History  Problem Relation Age of Onset  . Hypertension Mother     Cerebrovascular disease  . Diabetes Mother   . Coronary artery disease Father 72  . Diabetes type II Father   . Hypertension Father   . Heart attack Father   . Diabetes Brother   . Hypertension Brother   . Lung cancer Paternal Uncle   . Diabetes Sister   . Hypertension Sister   . Heart attack Sister 58  . Cancer Sister     leukemia  . Cancer Maternal Uncle     breast  . Cancer Maternal Grandmother  breast     Social History   Social History  . Marital status: Married    Spouse name: N/A  . Number of children: 0  . Years of education: N/A   Occupational History  . Retired    Social History Main Topics  . Smoking status: Former Smoker    Packs/day: 1.00    Years: 40.00    Types: Cigarettes    Start date: 10/20/1972    Quit date: 10/10/2012  . Smokeless tobacco: Never Used     Comment: Quit in May.   . Alcohol use No     Comment: former drinker-- sober since 2013.   . Drug use: No     Comment: quit cocaine 10/2011  . Sexual activity: Yes    Partners: Female   Other Topics Concern  . Not on file   Social History Narrative   Lives in [place].     BP 104/62 (BP Location: Left Arm, Patient Position: Sitting, Cuff Size: Large)   Pulse 78   Ht 6' (1.829 m)   Wt (!) 306 lb 12.8 oz (139.2 kg)   BMI 41.61 kg/m   Physical Exam:  stable appearing but obese, middle aged man, looks older than stated age, NAD HEENT: Unremarkable Neck:  7 cm JVD, no thyromegally Back:  No CVA tenderness Lungs:  Clear except for basilar rales HEART:  Regular rate rhythm, no murmurs, no rubs, no clicks Abd:  soft, obese, positive bowel sounds, no  organomegally, no rebound, no guarding Ext:  2 plus pulses, no edema, no cyanosis, no clubbing Skin:  No rashes no nodules Neuro:  CN II through XII intact, motor grossly intact  PPM interrogation - St. Jude DDD PPM working normally.     Assess/Plan: 1. PAF - he is not anticoagulated. He is out of rhythm about 50% of the time. He has undergone Watchman with success. Because his atrial fib has increased, I plan to uptitrate his Sotalol and reduce his metoprolol. He will return for an ECG on 160 bid of sotalol in 1-2 weeks. 2. Symptomatic bradycardia - he is asymptomatic. His St. Jude DDD PM is working normally. 3. CAD - he has had no anginal symptoms. Continue current meds 4. Obesity - he has gained some weight after losing weight from radiation induced anorexia which appears to be resolved.   Peter Dunlap.D.

## 2016-05-17 ENCOUNTER — Ambulatory Visit
Admission: RE | Admit: 2016-05-17 | Discharge: 2016-05-17 | Disposition: A | Discharge status: Home / Self Care | Payer: Medicare Other | Source: Ambulatory Visit | Attending: Radiation Oncology | Admitting: Radiation Oncology

## 2016-05-17 ENCOUNTER — Encounter (HOSPITAL_COMMUNITY): Payer: Medicare Other | Admitting: Physical Therapy

## 2016-05-17 ENCOUNTER — Encounter: Payer: Medicare Other | Admitting: Internal Medicine

## 2016-05-17 DIAGNOSIS — K219 Gastro-esophageal reflux disease without esophagitis: Secondary | ICD-10-CM | POA: Diagnosis not present

## 2016-05-17 DIAGNOSIS — I495 Sick sinus syndrome: Secondary | ICD-10-CM | POA: Diagnosis not present

## 2016-05-17 DIAGNOSIS — Z720 Tobacco use: Secondary | ICD-10-CM | POA: Diagnosis not present

## 2016-05-17 DIAGNOSIS — Z950 Presence of cardiac pacemaker: Secondary | ICD-10-CM | POA: Diagnosis not present

## 2016-05-17 DIAGNOSIS — I10 Essential (primary) hypertension: Secondary | ICD-10-CM | POA: Diagnosis not present

## 2016-05-17 DIAGNOSIS — Z87891 Personal history of nicotine dependence: Secondary | ICD-10-CM | POA: Diagnosis not present

## 2016-05-17 DIAGNOSIS — C61 Malignant neoplasm of prostate: Secondary | ICD-10-CM | POA: Diagnosis not present

## 2016-05-17 DIAGNOSIS — E079 Disorder of thyroid, unspecified: Secondary | ICD-10-CM | POA: Diagnosis not present

## 2016-05-17 DIAGNOSIS — Z9889 Other specified postprocedural states: Secondary | ICD-10-CM | POA: Diagnosis not present

## 2016-05-17 DIAGNOSIS — I252 Old myocardial infarction: Secondary | ICD-10-CM | POA: Diagnosis not present

## 2016-05-17 DIAGNOSIS — Z510 Encounter for antineoplastic radiation therapy: Secondary | ICD-10-CM | POA: Diagnosis not present

## 2016-05-17 DIAGNOSIS — I2510 Atherosclerotic heart disease of native coronary artery without angina pectoris: Secondary | ICD-10-CM | POA: Diagnosis not present

## 2016-05-17 DIAGNOSIS — E785 Hyperlipidemia, unspecified: Secondary | ICD-10-CM | POA: Diagnosis not present

## 2016-05-18 ENCOUNTER — Other Ambulatory Visit: Payer: Self-pay | Admitting: Family Medicine

## 2016-05-18 ENCOUNTER — Ambulatory Visit
Admission: RE | Admit: 2016-05-18 | Discharge: 2016-05-18 | Disposition: A | Discharge status: Home / Self Care | Payer: Medicare Other | Source: Ambulatory Visit | Attending: Radiation Oncology | Admitting: Radiation Oncology

## 2016-05-18 DIAGNOSIS — I252 Old myocardial infarction: Secondary | ICD-10-CM | POA: Diagnosis not present

## 2016-05-18 DIAGNOSIS — Z720 Tobacco use: Secondary | ICD-10-CM | POA: Diagnosis not present

## 2016-05-18 DIAGNOSIS — E079 Disorder of thyroid, unspecified: Secondary | ICD-10-CM | POA: Diagnosis not present

## 2016-05-18 DIAGNOSIS — E785 Hyperlipidemia, unspecified: Secondary | ICD-10-CM | POA: Diagnosis not present

## 2016-05-18 DIAGNOSIS — I495 Sick sinus syndrome: Secondary | ICD-10-CM | POA: Diagnosis not present

## 2016-05-18 DIAGNOSIS — Z510 Encounter for antineoplastic radiation therapy: Secondary | ICD-10-CM | POA: Diagnosis not present

## 2016-05-18 DIAGNOSIS — I10 Essential (primary) hypertension: Secondary | ICD-10-CM | POA: Diagnosis not present

## 2016-05-18 DIAGNOSIS — Z950 Presence of cardiac pacemaker: Secondary | ICD-10-CM | POA: Diagnosis not present

## 2016-05-18 DIAGNOSIS — Z9889 Other specified postprocedural states: Secondary | ICD-10-CM | POA: Diagnosis not present

## 2016-05-18 DIAGNOSIS — C61 Malignant neoplasm of prostate: Secondary | ICD-10-CM | POA: Diagnosis not present

## 2016-05-18 DIAGNOSIS — K219 Gastro-esophageal reflux disease without esophagitis: Secondary | ICD-10-CM | POA: Diagnosis not present

## 2016-05-18 DIAGNOSIS — I2510 Atherosclerotic heart disease of native coronary artery without angina pectoris: Secondary | ICD-10-CM | POA: Diagnosis not present

## 2016-05-18 DIAGNOSIS — Z87891 Personal history of nicotine dependence: Secondary | ICD-10-CM | POA: Diagnosis not present

## 2016-05-19 ENCOUNTER — Ambulatory Visit
Admission: RE | Admit: 2016-05-19 | Discharge: 2016-05-19 | Disposition: A | Discharge status: Home / Self Care | Payer: Medicare Other | Source: Ambulatory Visit | Attending: Radiation Oncology | Admitting: Radiation Oncology

## 2016-05-19 VITALS — BP 107/73 | HR 60 | Resp 16 | Wt 307.6 lb

## 2016-05-19 DIAGNOSIS — Z87891 Personal history of nicotine dependence: Secondary | ICD-10-CM | POA: Diagnosis not present

## 2016-05-19 DIAGNOSIS — C61 Malignant neoplasm of prostate: Secondary | ICD-10-CM | POA: Diagnosis not present

## 2016-05-19 DIAGNOSIS — E785 Hyperlipidemia, unspecified: Secondary | ICD-10-CM | POA: Diagnosis not present

## 2016-05-19 DIAGNOSIS — Z510 Encounter for antineoplastic radiation therapy: Secondary | ICD-10-CM | POA: Diagnosis not present

## 2016-05-19 DIAGNOSIS — I10 Essential (primary) hypertension: Secondary | ICD-10-CM | POA: Diagnosis not present

## 2016-05-19 DIAGNOSIS — I252 Old myocardial infarction: Secondary | ICD-10-CM | POA: Diagnosis not present

## 2016-05-19 DIAGNOSIS — E079 Disorder of thyroid, unspecified: Secondary | ICD-10-CM | POA: Diagnosis not present

## 2016-05-19 DIAGNOSIS — Z720 Tobacco use: Secondary | ICD-10-CM | POA: Diagnosis not present

## 2016-05-19 DIAGNOSIS — Z9889 Other specified postprocedural states: Secondary | ICD-10-CM | POA: Diagnosis not present

## 2016-05-19 DIAGNOSIS — I495 Sick sinus syndrome: Secondary | ICD-10-CM | POA: Diagnosis not present

## 2016-05-19 DIAGNOSIS — I2510 Atherosclerotic heart disease of native coronary artery without angina pectoris: Secondary | ICD-10-CM | POA: Diagnosis not present

## 2016-05-19 DIAGNOSIS — Z950 Presence of cardiac pacemaker: Secondary | ICD-10-CM | POA: Diagnosis not present

## 2016-05-19 DIAGNOSIS — K219 Gastro-esophageal reflux disease without esophagitis: Secondary | ICD-10-CM | POA: Diagnosis not present

## 2016-05-19 NOTE — Progress Notes (Signed)
Weight and vitals stable. Denies pain. Reports gout has cleared. Reports mild dysuria continues. Denies hematuria. Reports nocturia x 2-3. Reports left testicle remains swollen. Reports on occasion he will get the urge to void with little output. Reports diarrhea has been less these last two days.   BP 107/73 (BP Location: Left Arm, Patient Position: Sitting, Cuff Size: Normal)   Pulse 60   Resp 16   Wt (!) 307 lb 9.6 oz (139.5 kg)   SpO2 100%   BMI 41.72 kg/m  Wt Readings from Last 3 Encounters:  05/19/16 (!) 307 lb 9.6 oz (139.5 kg)  05/16/16 (!) 306 lb 12.8 oz (139.2 kg)  05/11/16 (!) 305 lb 12.8 oz (138.7 kg)

## 2016-05-19 NOTE — Progress Notes (Signed)
  Radiation Oncology         [phone]   Name: Peter Dunlap MRN: [id number]   Date: 05/19/2016  DOB: 1955-11-20   Weekly Radiation Therapy Management    ICD-9-CM ICD-10-CM   1. Prostate cancer ([place]) 185 C61     Current Dose: 46.8 Gy  Planned Dose:  78 Gy  Narrative The patient presents for routine under treatment assessment.  Denies pain or hematuria. Reports gout has cleared. Reports mild dysuria continues. Reports nocturia x 2-3. Reports left testicle remains swollen and somewhat painful when he urinates. Reports on occasion he will get the urge to void with little output. Reports diarrhea has been less these last two days.  Set-up films were reviewed. The chart was checked.  Physical Findings  weight is 307 lb 9.6 oz (139.5 kg) (abnormal). His blood pressure is 107/73 and his pulse is 60. His respiration is 16 and oxygen saturation is 100%.   Weight is essentially stable. Alert, in no acute distress.   Impression The patient still reports left testicular swelling but less pain and it is stable.  Plan Continue treatment as planned.         Sheral Apley Tammi Klippel, M.D.  This document serves as a record of services personally performed by Tyler Pita, MD. It was created on his behalf by Darcus Austin, a trained medical scribe. The creation of this record is based on the scribe's personal observations and the provider's statements to them. This document has been checked and approved by the attending provider.

## 2016-05-20 ENCOUNTER — Other Ambulatory Visit: Payer: Self-pay | Admitting: Family Medicine

## 2016-05-22 ENCOUNTER — Ambulatory Visit
Admission: RE | Admit: 2016-05-22 | Discharge: 2016-05-22 | Disposition: A | Discharge status: Home / Self Care | Payer: Medicare Other | Source: Ambulatory Visit | Attending: Radiation Oncology | Admitting: Radiation Oncology

## 2016-05-22 DIAGNOSIS — E785 Hyperlipidemia, unspecified: Secondary | ICD-10-CM | POA: Diagnosis not present

## 2016-05-22 DIAGNOSIS — Z87891 Personal history of nicotine dependence: Secondary | ICD-10-CM | POA: Diagnosis not present

## 2016-05-22 DIAGNOSIS — I495 Sick sinus syndrome: Secondary | ICD-10-CM | POA: Diagnosis not present

## 2016-05-22 DIAGNOSIS — Z950 Presence of cardiac pacemaker: Secondary | ICD-10-CM | POA: Diagnosis not present

## 2016-05-22 DIAGNOSIS — Z9889 Other specified postprocedural states: Secondary | ICD-10-CM | POA: Diagnosis not present

## 2016-05-22 DIAGNOSIS — I10 Essential (primary) hypertension: Secondary | ICD-10-CM | POA: Diagnosis not present

## 2016-05-22 DIAGNOSIS — Z720 Tobacco use: Secondary | ICD-10-CM | POA: Diagnosis not present

## 2016-05-22 DIAGNOSIS — E079 Disorder of thyroid, unspecified: Secondary | ICD-10-CM | POA: Diagnosis not present

## 2016-05-22 DIAGNOSIS — C61 Malignant neoplasm of prostate: Secondary | ICD-10-CM | POA: Diagnosis not present

## 2016-05-22 DIAGNOSIS — K219 Gastro-esophageal reflux disease without esophagitis: Secondary | ICD-10-CM | POA: Diagnosis not present

## 2016-05-22 DIAGNOSIS — I252 Old myocardial infarction: Secondary | ICD-10-CM | POA: Diagnosis not present

## 2016-05-22 DIAGNOSIS — Z510 Encounter for antineoplastic radiation therapy: Secondary | ICD-10-CM | POA: Diagnosis not present

## 2016-05-22 DIAGNOSIS — I2510 Atherosclerotic heart disease of native coronary artery without angina pectoris: Secondary | ICD-10-CM | POA: Diagnosis not present

## 2016-05-23 ENCOUNTER — Ambulatory Visit
Admission: RE | Admit: 2016-05-23 | Discharge: 2016-05-23 | Disposition: A | Discharge status: Home / Self Care | Payer: Medicare Other | Source: Ambulatory Visit | Attending: Radiation Oncology | Admitting: Radiation Oncology

## 2016-05-23 DIAGNOSIS — I2510 Atherosclerotic heart disease of native coronary artery without angina pectoris: Secondary | ICD-10-CM | POA: Diagnosis not present

## 2016-05-23 DIAGNOSIS — Z950 Presence of cardiac pacemaker: Secondary | ICD-10-CM | POA: Diagnosis not present

## 2016-05-23 DIAGNOSIS — I10 Essential (primary) hypertension: Secondary | ICD-10-CM | POA: Diagnosis not present

## 2016-05-23 DIAGNOSIS — Z720 Tobacco use: Secondary | ICD-10-CM | POA: Diagnosis not present

## 2016-05-23 DIAGNOSIS — I252 Old myocardial infarction: Secondary | ICD-10-CM | POA: Diagnosis not present

## 2016-05-23 DIAGNOSIS — I495 Sick sinus syndrome: Secondary | ICD-10-CM | POA: Diagnosis not present

## 2016-05-23 DIAGNOSIS — Z510 Encounter for antineoplastic radiation therapy: Secondary | ICD-10-CM | POA: Diagnosis not present

## 2016-05-23 DIAGNOSIS — C61 Malignant neoplasm of prostate: Secondary | ICD-10-CM | POA: Diagnosis not present

## 2016-05-23 DIAGNOSIS — Z9889 Other specified postprocedural states: Secondary | ICD-10-CM | POA: Diagnosis not present

## 2016-05-23 DIAGNOSIS — E785 Hyperlipidemia, unspecified: Secondary | ICD-10-CM | POA: Diagnosis not present

## 2016-05-23 DIAGNOSIS — Z87891 Personal history of nicotine dependence: Secondary | ICD-10-CM | POA: Diagnosis not present

## 2016-05-23 DIAGNOSIS — K219 Gastro-esophageal reflux disease without esophagitis: Secondary | ICD-10-CM | POA: Diagnosis not present

## 2016-05-23 DIAGNOSIS — E079 Disorder of thyroid, unspecified: Secondary | ICD-10-CM | POA: Diagnosis not present

## 2016-05-24 ENCOUNTER — Ambulatory Visit
Admission: RE | Admit: 2016-05-24 | Discharge: 2016-05-24 | Disposition: A | Discharge status: Home / Self Care | Payer: Medicare Other | Source: Ambulatory Visit | Attending: Radiation Oncology | Admitting: Radiation Oncology

## 2016-05-24 DIAGNOSIS — K219 Gastro-esophageal reflux disease without esophagitis: Secondary | ICD-10-CM | POA: Diagnosis not present

## 2016-05-24 DIAGNOSIS — I252 Old myocardial infarction: Secondary | ICD-10-CM | POA: Diagnosis not present

## 2016-05-24 DIAGNOSIS — I2510 Atherosclerotic heart disease of native coronary artery without angina pectoris: Secondary | ICD-10-CM | POA: Diagnosis not present

## 2016-05-24 DIAGNOSIS — C61 Malignant neoplasm of prostate: Secondary | ICD-10-CM | POA: Diagnosis not present

## 2016-05-24 DIAGNOSIS — Z950 Presence of cardiac pacemaker: Secondary | ICD-10-CM | POA: Diagnosis not present

## 2016-05-24 DIAGNOSIS — I495 Sick sinus syndrome: Secondary | ICD-10-CM | POA: Diagnosis not present

## 2016-05-24 DIAGNOSIS — I10 Essential (primary) hypertension: Secondary | ICD-10-CM | POA: Diagnosis not present

## 2016-05-24 DIAGNOSIS — Z87891 Personal history of nicotine dependence: Secondary | ICD-10-CM | POA: Diagnosis not present

## 2016-05-24 DIAGNOSIS — E079 Disorder of thyroid, unspecified: Secondary | ICD-10-CM | POA: Diagnosis not present

## 2016-05-24 DIAGNOSIS — E785 Hyperlipidemia, unspecified: Secondary | ICD-10-CM | POA: Diagnosis not present

## 2016-05-24 DIAGNOSIS — Z9889 Other specified postprocedural states: Secondary | ICD-10-CM | POA: Diagnosis not present

## 2016-05-24 DIAGNOSIS — Z720 Tobacco use: Secondary | ICD-10-CM | POA: Diagnosis not present

## 2016-05-24 DIAGNOSIS — Z510 Encounter for antineoplastic radiation therapy: Secondary | ICD-10-CM | POA: Diagnosis not present

## 2016-05-25 ENCOUNTER — Other Ambulatory Visit: Payer: Self-pay

## 2016-05-25 ENCOUNTER — Ambulatory Visit
Admission: RE | Admit: 2016-05-25 | Discharge: 2016-05-25 | Disposition: A | Discharge status: Home / Self Care | Payer: Medicare Other | Source: Ambulatory Visit | Attending: Radiation Oncology | Admitting: Radiation Oncology

## 2016-05-25 ENCOUNTER — Other Ambulatory Visit: Payer: Self-pay | Admitting: *Deleted

## 2016-05-25 DIAGNOSIS — C61 Malignant neoplasm of prostate: Secondary | ICD-10-CM | POA: Diagnosis not present

## 2016-05-25 DIAGNOSIS — Z950 Presence of cardiac pacemaker: Secondary | ICD-10-CM | POA: Diagnosis not present

## 2016-05-25 DIAGNOSIS — I10 Essential (primary) hypertension: Secondary | ICD-10-CM | POA: Diagnosis not present

## 2016-05-25 DIAGNOSIS — Z87891 Personal history of nicotine dependence: Secondary | ICD-10-CM | POA: Diagnosis not present

## 2016-05-25 DIAGNOSIS — E785 Hyperlipidemia, unspecified: Secondary | ICD-10-CM | POA: Diagnosis not present

## 2016-05-25 DIAGNOSIS — Z720 Tobacco use: Secondary | ICD-10-CM | POA: Diagnosis not present

## 2016-05-25 DIAGNOSIS — Z9889 Other specified postprocedural states: Secondary | ICD-10-CM | POA: Diagnosis not present

## 2016-05-25 DIAGNOSIS — I2510 Atherosclerotic heart disease of native coronary artery without angina pectoris: Secondary | ICD-10-CM | POA: Diagnosis not present

## 2016-05-25 DIAGNOSIS — Z510 Encounter for antineoplastic radiation therapy: Secondary | ICD-10-CM | POA: Diagnosis not present

## 2016-05-25 DIAGNOSIS — I495 Sick sinus syndrome: Secondary | ICD-10-CM | POA: Diagnosis not present

## 2016-05-25 DIAGNOSIS — K219 Gastro-esophageal reflux disease without esophagitis: Secondary | ICD-10-CM | POA: Diagnosis not present

## 2016-05-25 DIAGNOSIS — I252 Old myocardial infarction: Secondary | ICD-10-CM | POA: Diagnosis not present

## 2016-05-25 DIAGNOSIS — E079 Disorder of thyroid, unspecified: Secondary | ICD-10-CM | POA: Diagnosis not present

## 2016-05-25 NOTE — Telephone Encounter (Signed)
Records indicate prescription refill appropriate for Percocet.  Ok to refill??  Last office visit 03/10/2016.  Last refill 05/04/2016.

## 2016-05-25 NOTE — Patient Outreach (Signed)
[facility]) Care Management  05/25/2016  JIHAN LAUBY [birth year] [id number]   Telephone call to patient for monthly call. Male answered stating patient not in. Health Coach contact information provided.  Plan: RN Health Coach will attempt patient in the month of January.    [name], RN, MSN [place] [phone]

## 2016-05-26 ENCOUNTER — Ambulatory Visit
Admission: RE | Admit: 2016-05-26 | Discharge: 2016-05-26 | Disposition: A | Discharge status: Home / Self Care | Payer: Medicare Other | Source: Ambulatory Visit | Attending: Radiation Oncology | Admitting: Radiation Oncology

## 2016-05-26 VITALS — BP 136/79 | HR 66 | Temp 98.4°F | Resp 18 | Wt 306.6 lb

## 2016-05-26 DIAGNOSIS — I495 Sick sinus syndrome: Secondary | ICD-10-CM | POA: Diagnosis not present

## 2016-05-26 DIAGNOSIS — Z9889 Other specified postprocedural states: Secondary | ICD-10-CM | POA: Diagnosis not present

## 2016-05-26 DIAGNOSIS — K219 Gastro-esophageal reflux disease without esophagitis: Secondary | ICD-10-CM | POA: Diagnosis not present

## 2016-05-26 DIAGNOSIS — C61 Malignant neoplasm of prostate: Secondary | ICD-10-CM | POA: Diagnosis not present

## 2016-05-26 DIAGNOSIS — I2510 Atherosclerotic heart disease of native coronary artery without angina pectoris: Secondary | ICD-10-CM | POA: Diagnosis not present

## 2016-05-26 DIAGNOSIS — I252 Old myocardial infarction: Secondary | ICD-10-CM | POA: Diagnosis not present

## 2016-05-26 DIAGNOSIS — E785 Hyperlipidemia, unspecified: Secondary | ICD-10-CM | POA: Diagnosis not present

## 2016-05-26 DIAGNOSIS — Z510 Encounter for antineoplastic radiation therapy: Secondary | ICD-10-CM | POA: Diagnosis not present

## 2016-05-26 DIAGNOSIS — Z720 Tobacco use: Secondary | ICD-10-CM | POA: Diagnosis not present

## 2016-05-26 DIAGNOSIS — I10 Essential (primary) hypertension: Secondary | ICD-10-CM | POA: Diagnosis not present

## 2016-05-26 DIAGNOSIS — Z87891 Personal history of nicotine dependence: Secondary | ICD-10-CM | POA: Diagnosis not present

## 2016-05-26 DIAGNOSIS — E079 Disorder of thyroid, unspecified: Secondary | ICD-10-CM | POA: Diagnosis not present

## 2016-05-26 DIAGNOSIS — Z950 Presence of cardiac pacemaker: Secondary | ICD-10-CM | POA: Diagnosis not present

## 2016-05-26 NOTE — Progress Notes (Signed)
  Radiation Oncology         [phone]   Name: Peter Dunlap MRN: [id number]   Date: 05/26/2016  DOB: April 29, 1956   Weekly Radiation Therapy Management    ICD-9-CM ICD-10-CM   1. Prostate cancer ([place]) 185 C61     Current Dose: 56.55 Gy  Planned Dose:  78 Gy  Narrative The patient presents for routine under treatment assessment.  Weight and vitals stable. Reports testicular swelling continues to improve slowly. Reports mild dysuria. Reports improved rectal bleeding.  Set-up films were reviewed. The chart was checked.  Physical Findings  weight is 306 lb 9.6 oz (139.1 kg) (abnormal). His oral temperature is 98.4 F (36.9 C). His blood pressure is 136/79 and his pulse is 66. His respiration is 18 and oxygen saturation is 100%.   Weight is essentially stable. Alert, in no acute distress.   Impression The patient is doing well.  Plan Continue treatment as planned.    Sheral Apley Tammi Klippel, M.D.  This document serves as a record of services personally performed by Tyler Pita, MD and Shona Simpson, PA. It was created on his behalf by Maryla Morrow, a trained medical scribe. The creation of this record is based on the scribe's personal observations and the provider's statements to them. This document has been checked and approved by the attending provider.

## 2016-05-26 NOTE — Telephone Encounter (Signed)
okay

## 2016-05-26 NOTE — Progress Notes (Signed)
Weight and vitals stable. Reports testicular swelling continues to improve slowly. Reports mild dysuria. Reports improved rectal bleeding.Patient evaluated by Shona Simpson, PA-C

## 2016-05-30 ENCOUNTER — Ambulatory Visit
Admission: RE | Admit: 2016-05-30 | Discharge: 2016-05-30 | Disposition: A | Discharge status: Home / Self Care | Payer: Medicare Other | Source: Ambulatory Visit | Attending: Radiation Oncology | Admitting: Radiation Oncology

## 2016-05-30 DIAGNOSIS — C61 Malignant neoplasm of prostate: Secondary | ICD-10-CM | POA: Diagnosis not present

## 2016-05-30 DIAGNOSIS — Z510 Encounter for antineoplastic radiation therapy: Secondary | ICD-10-CM | POA: Diagnosis not present

## 2016-05-31 ENCOUNTER — Other Ambulatory Visit: Payer: Self-pay | Admitting: "Endocrinology

## 2016-05-31 ENCOUNTER — Encounter: Payer: Self-pay | Admitting: Internal Medicine

## 2016-05-31 ENCOUNTER — Ambulatory Visit
Admission: RE | Admit: 2016-05-31 | Discharge: 2016-05-31 | Disposition: A | Discharge status: Home / Self Care | Payer: Medicare Other | Source: Ambulatory Visit | Attending: Radiation Oncology | Admitting: Radiation Oncology

## 2016-05-31 ENCOUNTER — Ambulatory Visit (INDEPENDENT_AMBULATORY_CARE_PROVIDER_SITE_OTHER): Payer: Medicare Other

## 2016-05-31 VITALS — HR 71 | Ht 72.0 in | Wt 300.0 lb

## 2016-05-31 DIAGNOSIS — Z510 Encounter for antineoplastic radiation therapy: Secondary | ICD-10-CM | POA: Diagnosis not present

## 2016-05-31 DIAGNOSIS — Z79899 Other long term (current) drug therapy: Secondary | ICD-10-CM

## 2016-05-31 DIAGNOSIS — C61 Malignant neoplasm of prostate: Secondary | ICD-10-CM | POA: Diagnosis not present

## 2016-05-31 NOTE — Patient Instructions (Addendum)
Nurse Visit  1.) Reason for visit: EKG  2.) Name of MD requesting visit: Dr. Lovena Le  3.) H&P: Patient increased Sotalol 160 mg BID on 05/16/16. Pt states he feels well with no complaints.   4.) ROS related to problem: Medication management for Sotalol. Sotalol was increased to 160 mg twice a day on 05/16/16 (Metoprolol d/c'd). Reviewed all medications. Patient reports he is tolerating the Sotalol increase well. Pt stated he is currently taking Aspirin 81 mg daily instead of 325 mg (due to bleeding r/t to radiation treatments).   5.) Assessment and plan per MD: EKG reviewed by Dr. Rayann Heman. Dr. Rayann Heman wants Dr. Lovena Le to review EKG and advise. For now, pt will continue current medications. Informed pt I will call tomorrow with any further recommendations from Dr. Lovena Le.

## 2016-06-01 ENCOUNTER — Ambulatory Visit
Admission: RE | Admit: 2016-06-01 | Discharge: 2016-06-01 | Disposition: A | Discharge status: Home / Self Care | Payer: Medicare Other | Source: Ambulatory Visit | Attending: Radiation Oncology | Admitting: Radiation Oncology

## 2016-06-01 DIAGNOSIS — C61 Malignant neoplasm of prostate: Secondary | ICD-10-CM | POA: Diagnosis not present

## 2016-06-01 DIAGNOSIS — Z510 Encounter for antineoplastic radiation therapy: Secondary | ICD-10-CM | POA: Diagnosis not present

## 2016-06-01 MED ORDER — OXYCODONE-ACETAMINOPHEN 5-325 MG PO TABS: 1.0000 | ORAL_TABLET | Freq: Four times a day (QID) | ORAL | 0 refills | Status: DC | PRN

## 2016-06-01 NOTE — Telephone Encounter (Signed)
Called, spoke with pt's wife, Raquel Sarna (on [place]). Informed Dr. Lovena Le reviewed EKG and would like pt to continue on Sotalol 160 mg twice daily. Raquel Sarna verbalized understanding and stated she would relay message to pt.

## 2016-06-01 NOTE — Telephone Encounter (Signed)
Done. Called patient to let him know Rx ready for pick up at front desk.  No answer.

## 2016-06-02 ENCOUNTER — Encounter: Payer: Self-pay | Admitting: Radiation Oncology

## 2016-06-02 ENCOUNTER — Ambulatory Visit
Admission: RE | Admit: 2016-06-02 | Discharge: 2016-06-02 | Disposition: A | Discharge status: Home / Self Care | Payer: Medicare Other | Source: Ambulatory Visit | Attending: Radiation Oncology | Admitting: Radiation Oncology

## 2016-06-02 VITALS — BP 133/82 | HR 65 | Temp 98.0°F | Wt 305.0 lb

## 2016-06-02 DIAGNOSIS — C61 Malignant neoplasm of prostate: Secondary | ICD-10-CM | POA: Diagnosis not present

## 2016-06-02 DIAGNOSIS — Z510 Encounter for antineoplastic radiation therapy: Secondary | ICD-10-CM | POA: Diagnosis not present

## 2016-06-02 NOTE — Progress Notes (Signed)
  Radiation Oncology         [phone]   Name: Peter Dunlap MRN: [id number]   Date: 06/02/2016  DOB: 1955-09-18   Weekly Radiation Therapy Management    ICD-9-CM ICD-10-CM   1. Prostate cancer ([place]) 185 C61     Current Dose: 64.35 Gy  Planned Dose:  78 Gy  Narrative The patient presents for routine under treatment assessment.  Weight and vitals stable. He reports occasional fatigue. The patient denies pain. He reports urinary frequency, retention, hesitancy, dysuria, incontinence, and dribbling. He reports nocturia x 3-4. The patient reports occasional diarrhea for which he takes Imodium. He also reports some constipation for which he drinks prune juice.  Set-up films were reviewed. The chart was checked.  Physical Findings  weight is 305 lb (138.3 kg) (abnormal). His oral temperature is 98 F (36.7 C). His blood pressure is 133/82 and his pulse is 65. His oxygen saturation is 100%.   Weight is essentially stable. Alert, in no acute distress.   Impression The patient is doing well.  Plan Continue treatment as planned.   ------------------------------------------------  Jodelle Gross, MD, PhD  This document serves as a record of services personally performed by Kyung Rudd, MD. It was created on his behalf by Maryla Morrow, a trained medical scribe. The creation of this record is based on the scribe's personal observations and the provider's statements to them. This document has been checked and approved by the attending provider.

## 2016-06-02 NOTE — Progress Notes (Signed)
PAIN: He is currently in no pain. URINARY: Pt reports urinary frequency, retention, hesistency, pain with urination, incontinence and dribbling. Pt states they urinate 3 - 4 times per night.  BOWEL: Pt reports Diarrhea- occasionally, takes Imodium as needed and Constipation on occasional, drinks prune juice as needed. OTHER: Pt complains of fatigue occasionally. BP 133/82   Pulse 65   Temp 98 F (36.7 C) (Oral)   Wt (!) 305 lb (138.3 kg)   SpO2 100%   BMI 41.37 kg/m  Wt Readings from Last 3 Encounters:  06/02/16 (!) 305 lb (138.3 kg)  05/31/16 300 lb (136.1 kg)  05/26/16 (!) 306 lb 9.6 oz (139.1 kg)

## 2016-06-03 ENCOUNTER — Other Ambulatory Visit: Payer: Self-pay | Admitting: Family Medicine

## 2016-06-06 ENCOUNTER — Ambulatory Visit
Admission: RE | Admit: 2016-06-06 | Discharge: 2016-06-06 | Disposition: A | Discharge status: Home / Self Care | Payer: Medicare Other | Source: Ambulatory Visit | Attending: Radiation Oncology | Admitting: Radiation Oncology

## 2016-06-06 DIAGNOSIS — Z510 Encounter for antineoplastic radiation therapy: Secondary | ICD-10-CM | POA: Diagnosis not present

## 2016-06-06 DIAGNOSIS — C61 Malignant neoplasm of prostate: Secondary | ICD-10-CM | POA: Diagnosis not present

## 2016-06-07 ENCOUNTER — Ambulatory Visit
Admission: RE | Admit: 2016-06-07 | Discharge: 2016-06-07 | Disposition: A | Discharge status: Home / Self Care | Payer: Medicare Other | Source: Ambulatory Visit | Attending: Radiation Oncology | Admitting: Radiation Oncology

## 2016-06-07 DIAGNOSIS — C61 Malignant neoplasm of prostate: Secondary | ICD-10-CM | POA: Diagnosis not present

## 2016-06-07 DIAGNOSIS — Z510 Encounter for antineoplastic radiation therapy: Secondary | ICD-10-CM | POA: Diagnosis not present

## 2016-06-08 ENCOUNTER — Ambulatory Visit
Admission: RE | Admit: 2016-06-08 | Discharge: 2016-06-08 | Disposition: A | Discharge status: Home / Self Care | Payer: Medicare Other | Source: Ambulatory Visit | Attending: Radiation Oncology | Admitting: Radiation Oncology

## 2016-06-08 ENCOUNTER — Encounter: Payer: Self-pay | Admitting: Medical Oncology

## 2016-06-08 VITALS — BP 98/52 | HR 76 | Temp 97.5°F | Ht 72.0 in | Wt 299.6 lb

## 2016-06-08 DIAGNOSIS — C61 Malignant neoplasm of prostate: Secondary | ICD-10-CM

## 2016-06-08 DIAGNOSIS — Z510 Encounter for antineoplastic radiation therapy: Secondary | ICD-10-CM | POA: Diagnosis not present

## 2016-06-08 NOTE — Progress Notes (Signed)
Mr. Robson presents for his 36th fraction of radiation to his Prostate. He reports pain to his Left Groin area when he has been standing a long time. He reports some mild fatigue at times. He reports some burning with urination. He also has urgency, and will only void a small amount when he urinates. He denies hematuria. He reports getting up 2-3 times nightly to urinate. He reports recent constipation and has been taking miralax as needed. His blood pressure is low today. He reports seeing his cardiologist last week for a check up. He is taking several blood pressure medicines and lasix daily.   BP (!) 98/52   Pulse 76   Temp 97.5 F (36.4 C)   Ht 6' (1.829 m)   Wt 299 lb 9.6 oz (135.9 kg)   SpO2 100% Comment: room air  BMI 40.63 kg/m    Wt Readings from Last 3 Encounters:  06/08/16 299 lb 9.6 oz (135.9 kg)  06/02/16 (!) 305 lb (138.3 kg)  05/31/16 300 lb (136.1 kg)

## 2016-06-08 NOTE — Progress Notes (Signed)
  Radiation Oncology         [id number]   Name: Peter Dunlap MRN: [id number]   Date: 06/08/2016  DOB: 05/14/1956   Weekly Radiation Therapy Management    ICD-9-CM ICD-10-CM   1. Prostate cancer ([place]) 185 C61     Current Dose: 70.2 Gy  Planned Dose:  78 Gy  Narrative The patient presents for routine under treatment assessment.  Weight is stable. Blood pressure is low today for which he is taking several blood pressure medications and Lasix daily per cardiologist. Peter Dunlap presents for his 36th fraction of radiation to his prostate. He reports pain to his left groin area after prolonged periods of standing. He reports mild fatigue at times. He reports burning with urination. He reports urgency, and only voids a small amount when he urinates. He states nocturia x 2-3. He also notes constipation alleviated with Miralax. He denies hematuria.   Set-up films were reviewed. The chart was checked.  Physical Findings  height is 6' (1.829 m) and weight is 299 lb 9.6 oz (135.9 kg). His temperature is 97.5 F (36.4 C). His blood pressure is 98/52 (abnormal) and his pulse is 76. His oxygen saturation is 100%.   Weight is essentially stable. Alert, in no acute distress.   Impression The patient is doing well.  Plan Continue treatment as planned.    Sheral Apley Tammi Klippel, M.D.  This document serves as a record of services personally performed by Tyler Pita, MD. It was created on his behalf by Bethann Humble, a trained medical scribe. The creation of this record is based on the scribe's personal observations and the provider's statements to them. This document has been checked and approved by the attending provider.

## 2016-06-09 ENCOUNTER — Encounter: Payer: Self-pay | Admitting: Medical Oncology

## 2016-06-09 ENCOUNTER — Telehealth: Payer: Self-pay | Admitting: Family Medicine

## 2016-06-09 ENCOUNTER — Ambulatory Visit
Admission: RE | Admit: 2016-06-09 | Discharge: 2016-06-09 | Disposition: A | Discharge status: Home / Self Care | Payer: Medicare Other | Source: Ambulatory Visit | Attending: Radiation Oncology | Admitting: Radiation Oncology

## 2016-06-09 DIAGNOSIS — C61 Malignant neoplasm of prostate: Secondary | ICD-10-CM | POA: Diagnosis not present

## 2016-06-09 DIAGNOSIS — Z510 Encounter for antineoplastic radiation therapy: Secondary | ICD-10-CM | POA: Diagnosis not present

## 2016-06-09 NOTE — Telephone Encounter (Signed)
Prescription is still at front desk awaiting pick up.   Call placed to patient and patient made aware per VM.

## 2016-06-09 NOTE — Telephone Encounter (Signed)
Patient asking for rx for oxycodone

## 2016-06-09 NOTE — Progress Notes (Signed)
Peter Dunlap states he is tolerating radiation well. He has nocturia 4-5 times a night and some frequency. He is having some dizziness and states his blood pressure was on the low side when he saw Dr. Lisbeth Renshaw yesterday. We discussed this is not related to radiation. He has heart failure and takes BP medication  and lasix. He is following up with his primary care MD to discuss today.

## 2016-06-11 ENCOUNTER — Other Ambulatory Visit: Payer: Self-pay | Admitting: Family Medicine

## 2016-06-11 ENCOUNTER — Other Ambulatory Visit: Payer: Self-pay | Admitting: "Endocrinology

## 2016-06-11 ENCOUNTER — Other Ambulatory Visit: Payer: Self-pay | Admitting: Internal Medicine

## 2016-06-12 ENCOUNTER — Ambulatory Visit
Admission: RE | Admit: 2016-06-12 | Discharge: 2016-06-12 | Disposition: A | Discharge status: Home / Self Care | Payer: Medicare Other | Source: Ambulatory Visit | Attending: Radiation Oncology | Admitting: Radiation Oncology

## 2016-06-12 DIAGNOSIS — C61 Malignant neoplasm of prostate: Secondary | ICD-10-CM | POA: Diagnosis not present

## 2016-06-12 DIAGNOSIS — Z510 Encounter for antineoplastic radiation therapy: Secondary | ICD-10-CM | POA: Diagnosis not present

## 2016-06-12 MED ORDER — LEVOTHYROXINE SODIUM 200 MCG PO TABS: 200.0000 ug | ORAL_TABLET | Freq: Every day | ORAL | 6 refills | Status: DC

## 2016-06-13 ENCOUNTER — Ambulatory Visit
Admission: RE | Admit: 2016-06-13 | Discharge: 2016-06-13 | Disposition: A | Discharge status: Home / Self Care | Payer: Medicare Other | Source: Ambulatory Visit | Attending: Radiation Oncology | Admitting: Radiation Oncology

## 2016-06-13 DIAGNOSIS — Z510 Encounter for antineoplastic radiation therapy: Secondary | ICD-10-CM | POA: Diagnosis not present

## 2016-06-13 DIAGNOSIS — C61 Malignant neoplasm of prostate: Secondary | ICD-10-CM | POA: Diagnosis not present

## 2016-06-14 ENCOUNTER — Encounter: Payer: Self-pay | Admitting: Radiation Oncology

## 2016-06-14 ENCOUNTER — Ambulatory Visit
Admission: RE | Admit: 2016-06-14 | Discharge: 2016-06-14 | Disposition: A | Discharge status: Home / Self Care | Payer: Medicare Other | Source: Ambulatory Visit | Attending: Radiation Oncology | Admitting: Radiation Oncology

## 2016-06-14 DIAGNOSIS — Z510 Encounter for antineoplastic radiation therapy: Secondary | ICD-10-CM | POA: Diagnosis not present

## 2016-06-14 DIAGNOSIS — C61 Malignant neoplasm of prostate: Secondary | ICD-10-CM | POA: Diagnosis not present

## 2016-06-15 ENCOUNTER — Other Ambulatory Visit: Payer: Self-pay

## 2016-06-15 NOTE — Patient Outreach (Signed)
[facility]) Care Management  06/15/2016  TANUJ TENZER 12-03-1955 [id number]   Telephone call to patient for monthly call. Wife answers and states patient is with the nurse. Health Coach contact information given and asked her to have patient return call. She verbalized understanding.  Plan: RN Health Coach will wait patient return call.  If no return call within one day will send letter outreach.   [name], RN, MSN [place] [phone]

## 2016-06-15 NOTE — Patient Outreach (Signed)
[name]) Care Management  [place]  06/15/2016   Peter Dunlap 06/20/1955 [id number]  Subjective: Telephone call from patient for monthly call.  Patient excited to say he finished his last radiation treatment yesterday. He reports that he sees his primary doctor on 06-19-16 for follow up to know next steps. Patient reports some slight discomfort in the groin area but will see what the doctor says. He reports no problems with his sugars.  Discussed with patient diabetes and diet. He verbalized understanding.    Objective:   Encounter Medications:  Outpatient Encounter Prescriptions as of 06/15/2016  Medication Sig Note  . allopurinol (ZYLOPRIM) 100 MG tablet TAKE 1 TABLET BY MOUTH EVERY DAY   . aspirin EC 325 MG EC tablet Take 1 tablet (325 mg total) by mouth daily. (Patient taking differently: Take 81 mg by mouth daily. )   . colchicine 0.6 MG tablet Take 1 tablet (0.6 mg total) by mouth daily.   [name] DEXILANT 60 MG capsule TAKE 1 CAPSULE BY MOUTH EVERY DAY   . furosemide (LASIX) 20 MG tablet TAKE 3 TABLETS BY MOUTH DAILY   . isosorbide mononitrate (IMDUR) 60 MG 24 hr tablet TAKE 1 TABLET BY MOUTH EVERY DAY   . levothyroxine (SYNTHROID, LEVOTHROID) 200 MCG tablet Take 1 tablet (200 mcg total) by mouth daily before breakfast.   . Magnesium Oxide 400 MG CAPS Take 1 capsule (400 mg total) by mouth 2 (two) times daily.   . metFORMIN (GLUCOPHAGE) 500 MG tablet TAKE 1 TABLET(500 MG) BY MOUTH TWICE DAILY WITH A MEAL   . metoprolol tartrate (LOPRESSOR) 25 MG tablet  05/19/2016: Received from: External Pharmacy  . nitroGLYCERIN (NITROSTAT) 0.4 MG SL tablet PLACE 1 TABLET UNDER THE TONGUE EVERY 5 MINUTES AS NEEDED FOR CHEST PAIN   . ONE TOUCH ULTRA TEST test strip USE TO TEST ONCE DAILY   . potassium chloride SA (K-DUR,KLOR-CON) 20 MEQ tablet TAKE 1 TABLET BY MOUTH EVERY DAY   . RAPAFLO 8 MG CAPS capsule TAKE 1 CAPSULE(8 MG) BY MOUTH DAILY   . simvastatin (ZOCOR) 40 MG  tablet TAKE 1 TABLET(40 MG) BY MOUTH DAILY   . sotalol (BETAPACE) 160 MG tablet Take 1 tablet (160 mg total) by mouth 2 (two) times daily.   . traZODone (DESYREL) 50 MG tablet TAKE 1/2 TO 1 TABLET BY MOUTH AT BEDTIME AS NEEDED FOR SLEEP   . colchicine 0.6 MG tablet TAKE 1 TABLET(0.6 MG) BY MOUTH DAILY   . MAGNESIUM-OXIDE 400 (241.3 Mg) MG tablet TK 1 T PO  BID 05/19/2016: Received from: External Pharmacy  . oxyCODONE-acetaminophen (ROXICET) 5-325 MG tablet Take 1 tablet by mouth every 6 (six) hours as needed for severe pain.    Facility-Administered Encounter Medications as of 06/15/2016  Medication  . 0.9 %  sodium chloride infusion    Functional Status:  No flowsheet data found.  Fall/Depression Screening: PHQ 2/9 Scores 06/15/2016 05/11/2016 04/05/2016 03/06/2016 03/01/2016 02/04/2016 01/04/2016  PHQ - 2 Score 0 0 0 0 0 0 0  PHQ- 9 Score - - - - - - -    Assessment: Patient continues to benefit from health coach outreach for disease management and support.    Plan:  [facility] CM Care Plan Problem One   Flowsheet Row Most Recent Value  Care Plan Problem One  Diabetes Knowledge deficit  Role Documenting the Problem One  Health Coach  Care Plan for Problem One  Active  [facility] Long Term Goal (31-90 days)  Patient will verbalize care for diabetes such as diet  and blood sugar monitoring within the next 90 days.  [facility] Long Term Goal Start Date  06/15/16  Interventions for Problem One [place] discussed with patient foods high in carbohydrates and reasons to monitor blood sugar regularly.      RN Health Coach will contact patient in the month of February and patient agrees to next outreach.  [name], RN, MSN [place] [phone]

## 2016-06-16 ENCOUNTER — Other Ambulatory Visit: Payer: Self-pay | Admitting: Family Medicine

## 2016-06-16 NOTE — Progress Notes (Signed)
  Radiation Oncology         [phone] ________________________________  Name: Peter Dunlap MRN: [id number]  Date: 06/14/2016  DOB: Sep 22, 1955  End of Treatment Note  Diagnosis:   61 y.o. gentleman with stage T1c, adenocarcinoma of the prostate with a Gleason's Score of 4+3, and a PSA of 9.09.      Indication for treatment:  Curative, Definitive Radiotherapy       Radiation treatment dates:   04/17/2016 to 06/14/2016  Site/dose:   The prostate was treated to 78 Gy in 40 fractions of 1.95 Gy  Beams/energy:   The patient was treated with IMRT using volumetric arc therapy delivering 6 MV X-rays to clockwise and counterclockwise circumferential arcs with a 90 degree collimator offset to avoid dose scalloping.  Image guidance was performed with daily cone beam CT prior to each fraction to align to gold markers in the prostate and assure proper bladder and rectal fill volumes.  Immobilization was achieved with BodyFix custom mold.  Narrative: The patient tolerated radiation treatment relatively well.   The patient experienced modest fatigue and some minor urinary irritation including, dysuria, urgency, nocturia x 2-3, and only voiding small amount when he urinates. He also notes constipation alleviated with Miralax. He reports pain to his left groin after prolonged periods of standing.   Plan: The patient has completed radiation treatment. He will return to radiation oncology clinic for routine followup in one month. I advised him to call or return sooner if he has any questions or concerns related to his recovery or treatment. ________________________________  Sheral Apley. Tammi Klippel, M.D.  This document serves as a record of services personally performed by Tyler Pita, MD. It was created on his behalf by Arlyce Harman, a trained medical scribe. The creation of this record is based on the scribe's personal observations and the provider's statements to them. This document has been checked  and approved by the attending provider.

## 2016-06-19 ENCOUNTER — Encounter: Payer: Self-pay | Admitting: Family Medicine

## 2016-06-19 ENCOUNTER — Ambulatory Visit (INDEPENDENT_AMBULATORY_CARE_PROVIDER_SITE_OTHER): Payer: Medicare Other | Admitting: Family Medicine

## 2016-06-19 VITALS — BP 108/64 | HR 80 | Temp 98.7°F | Resp 16 | Ht 72.0 in | Wt 303.0 lb

## 2016-06-19 DIAGNOSIS — R1310 Dysphagia, unspecified: Secondary | ICD-10-CM

## 2016-06-19 DIAGNOSIS — E1169 Type 2 diabetes mellitus with other specified complication: Secondary | ICD-10-CM

## 2016-06-19 DIAGNOSIS — C61 Malignant neoplasm of prostate: Secondary | ICD-10-CM | POA: Diagnosis not present

## 2016-06-19 DIAGNOSIS — I10 Essential (primary) hypertension: Secondary | ICD-10-CM

## 2016-06-19 DIAGNOSIS — N390 Urinary tract infection, site not specified: Secondary | ICD-10-CM | POA: Diagnosis not present

## 2016-06-19 DIAGNOSIS — E669 Obesity, unspecified: Secondary | ICD-10-CM

## 2016-06-19 DIAGNOSIS — M1711 Unilateral primary osteoarthritis, right knee: Secondary | ICD-10-CM | POA: Diagnosis not present

## 2016-06-19 LAB — COMPREHENSIVE METABOLIC PANEL
ALT: 10 U/L (ref 9–46)
AST: 20 U/L (ref 10–35)
Albumin: 3.9 g/dL (ref 3.6–5.1)
Alkaline Phosphatase: 71 U/L (ref 40–115)
BUN: 11 mg/dL (ref 7–25)
CO2: 24 mmol/L (ref 20–31)
Calcium: 9.1 mg/dL (ref 8.6–10.3)
Chloride: 106 mmol/L (ref 98–110)
Creat: 1.08 mg/dL (ref 0.70–1.25)
Glucose, Bld: 82 mg/dL (ref 70–99)
Potassium: 4.7 mmol/L (ref 3.5–5.3)
Sodium: 140 mmol/L (ref 135–146)
Total Bilirubin: 0.9 mg/dL (ref 0.2–1.2)
Total Protein: 7.2 g/dL (ref 6.1–8.1)

## 2016-06-19 LAB — CBC WITH DIFFERENTIAL/PLATELET
Basophils Absolute: 0 cells/uL (ref 0–200)
Basophils Relative: 0 %
Eosinophils Absolute: 142 cells/uL (ref 15–500)
Eosinophils Relative: 2 %
HCT: 48.3 % (ref 38.5–50.0)
Hemoglobin: 16.4 g/dL (ref 13.0–17.0)
Lymphocytes Relative: 29 %
Lymphs Abs: 2059 cells/uL (ref 850–3900)
MCH: 32.5 pg (ref 27.0–33.0)
MCHC: 34 g/dL (ref 32.0–36.0)
MCV: 95.8 fL (ref 80.0–100.0)
MPV: 10.2 fL (ref 7.5–12.5)
Monocytes Absolute: 1491 cells/uL — ABNORMAL HIGH (ref 200–950)
Monocytes Relative: 21 %
Neutro Abs: 3408 cells/uL (ref 1500–7800)
Neutrophils Relative %: 48 %
Platelets: 176 10*3/uL (ref 140–400)
RBC: 5.04 MIL/uL (ref 4.20–5.80)
RDW: 15.6 % — ABNORMAL HIGH (ref 11.0–15.0)
WBC: 7.1 10*3/uL (ref 3.8–10.8)

## 2016-06-19 LAB — URINALYSIS, ROUTINE W REFLEX MICROSCOPIC
Bilirubin Urine: NEGATIVE
Glucose, UA: NEGATIVE
Nitrite: POSITIVE — AB
Specific Gravity, Urine: 1.02 (ref 1.001–1.035)
pH: 7.5 (ref 5.0–8.0)

## 2016-06-19 LAB — URINALYSIS, MICROSCOPIC ONLY
Casts: NONE SEEN [LPF]
Crystals: NONE SEEN [HPF]
Squamous Epithelial / LPF: NONE SEEN [HPF] (ref <=5)
Yeast: NONE SEEN [HPF]

## 2016-06-19 LAB — HEMOGLOBIN A1C
Hgb A1c MFr Bld: 5.6 % (ref <5.7)
Mean Plasma Glucose: 114 mg/dL

## 2016-06-19 MED ORDER — OXYCODONE-ACETAMINOPHEN 5-325 MG PO TABS: 1.0000 | ORAL_TABLET | Freq: Four times a day (QID) | ORAL | 0 refills | Status: DC | PRN

## 2016-06-19 MED ORDER — SULFAMETHOXAZOLE-TRIMETHOPRIM 800-160 MG PO TABS: 1.0000 | ORAL_TABLET | Freq: Two times a day (BID) | ORAL | 0 refills | Status: DC

## 2016-06-19 NOTE — Patient Instructions (Addendum)
Referral to Dr. Collene Mares for food getting hung  Take antibiotics as prescribed We will call with lab results  F/U 4 months

## 2016-06-19 NOTE — Progress Notes (Signed)
Subjective:    Patient ID: Peter Dunlap, male    DOB: 01-20-1956, 61 y.o.   MRN: [id number]  Patient presents for Follow-up (is not fasting)  Recently finished radiation treatment for prostate cancer had swelling of left testicle and pain into groin, started   Continues to have problems, urinating, sometimes can go regular other times just drops, has some clear fluid   With pills feel like things are getting hung in chest , he is on dexilant for reflux    Had nurse home visit BP 102/67  Review Of Systems:  GEN- denies fatigue, fever, weight loss,weakness, recent illness HEENT- denies eye drainage, change in vision, nasal discharge, CVS- denies chest pain, palpitations RESP- denies SOB, cough, wheeze ABD- denies N/V, change in stools,+ abd pain GU- denies dysuria, hematuria, dribbling, incontinence MSK- + joint pain, muscle aches, injury Neuro- denies headache, dizziness, syncope, seizure activity       Objective:    BP 108/64 (BP Location: Left Arm, Patient Position: Sitting, Cuff Size: Large)   Pulse 80   Temp 98.7 F (37.1 C) (Oral)   Resp 16   Ht 6' (1.829 m)   Wt (!) 303 lb (137.4 kg)   SpO2 99%   BMI 41.09 kg/m  GEN- NAD, alert and oriented x3 HEENT- PERRL, EOMI, non injected sclera, pink conjunctiva, MMM, oropharynx clear CVS- RRR, no murmur RESP-CTAB ABD-NABS,soft,mild TTP epigastric region, no CVA tenderness, no suprapubic tenderness EXT- No edema Pulses- Radial, DP- 2+        Assessment & Plan:      Problem List Items Addressed This Visit    Prostate cancer ([place])    Status post radiation treatment now with UTI symptoms. His urinalysis and is significant for infection. We'll start him on antibiotics. I will send his urine off for culture.      Relevant Medications   aspirin 81 MG chewable tablet   sulfamethoxazole-trimethoprim (BACTRIM DS,SEPTRA DS) 800-160 MG tablet   OA (osteoarthritis) of knee   Relevant Medications   aspirin 81 MG  chewable tablet   oxyCODONE-acetaminophen (ROXICET) 5-325 MG tablet   Hypertension - Primary (Chronic)    Blood pressure is on the low normal side at this time he is not symptomatic therefore we will just monitor      Relevant Medications   aspirin 81 MG chewable tablet   Other Relevant Orders   Comprehensive metabolic panel (Completed)   CBC with Differential/Platelet (Completed)   Diabetes mellitus type 2 in obese (HCC) (Chronic)    Recheck A1c currently on metformin low-dose once a day may be able to discontinue this.      Relevant Medications   aspirin 81 MG chewable tablet   Other Relevant Orders   Hemoglobin A1c (Completed)    Other Visit Diagnoses    Urinary tract infection without hematuria, site unspecified       Treat with bactrim twice a day for 7 days,culture pending, Cipro interferes with sotalol recently increased    Relevant Medications   sulfamethoxazole-trimethoprim (BACTRIM DS,SEPTRA DS) 800-160 MG tablet   Other Relevant Orders   Urinalysis, Routine w reflex microscopic (Completed)   Urine culture   Dysphagia, unspecified type       Referral to GI for further evaluation on GERD medication    Relevant Orders   Ambulatory referral to Gastroenterology      Note: This dictation was prepared with Dragon dictation along with smaller phrase technology. Any transcriptional errors that result from this process  are unintentional.

## 2016-06-20 ENCOUNTER — Encounter: Payer: Self-pay | Admitting: Family Medicine

## 2016-06-20 NOTE — Assessment & Plan Note (Signed)
Status post radiation treatment now with UTI symptoms. His urinalysis and is significant for infection. We'll start him on antibiotics. I will send his urine off for culture.

## 2016-06-20 NOTE — Assessment & Plan Note (Signed)
Blood pressure is on the low normal side at this time he is not symptomatic therefore we will just monitor

## 2016-06-20 NOTE — Assessment & Plan Note (Signed)
Recheck A1c currently on metformin low-dose once a day may be able to discontinue this.

## 2016-06-21 ENCOUNTER — Emergency Department (HOSPITAL_COMMUNITY): Payer: Medicare Other

## 2016-06-21 ENCOUNTER — Emergency Department (HOSPITAL_COMMUNITY)
Admission: EM | Admit: 2016-06-21 | Discharge: 2016-06-21 | Disposition: A | Discharge status: Home / Self Care | Payer: Medicare Other | Attending: Emergency Medicine | Admitting: Emergency Medicine

## 2016-06-21 ENCOUNTER — Encounter (HOSPITAL_COMMUNITY): Payer: Self-pay | Admitting: *Deleted

## 2016-06-21 DIAGNOSIS — E119 Type 2 diabetes mellitus without complications: Secondary | ICD-10-CM | POA: Insufficient documentation

## 2016-06-21 DIAGNOSIS — Z7982 Long term (current) use of aspirin: Secondary | ICD-10-CM | POA: Diagnosis not present

## 2016-06-21 DIAGNOSIS — N433 Hydrocele, unspecified: Secondary | ICD-10-CM | POA: Diagnosis not present

## 2016-06-21 DIAGNOSIS — I5032 Chronic diastolic (congestive) heart failure: Secondary | ICD-10-CM | POA: Diagnosis not present

## 2016-06-21 DIAGNOSIS — Z8546 Personal history of malignant neoplasm of prostate: Secondary | ICD-10-CM | POA: Insufficient documentation

## 2016-06-21 DIAGNOSIS — E039 Hypothyroidism, unspecified: Secondary | ICD-10-CM | POA: Insufficient documentation

## 2016-06-21 DIAGNOSIS — N419 Inflammatory disease of prostate, unspecified: Secondary | ICD-10-CM

## 2016-06-21 DIAGNOSIS — Z79899 Other long term (current) drug therapy: Secondary | ICD-10-CM | POA: Diagnosis not present

## 2016-06-21 DIAGNOSIS — Z87891 Personal history of nicotine dependence: Secondary | ICD-10-CM | POA: Insufficient documentation

## 2016-06-21 DIAGNOSIS — I2510 Atherosclerotic heart disease of native coronary artery without angina pectoris: Secondary | ICD-10-CM | POA: Insufficient documentation

## 2016-06-21 DIAGNOSIS — R52 Pain, unspecified: Secondary | ICD-10-CM

## 2016-06-21 DIAGNOSIS — Z950 Presence of cardiac pacemaker: Secondary | ICD-10-CM | POA: Insufficient documentation

## 2016-06-21 DIAGNOSIS — I110 Hypertensive heart disease with heart failure: Secondary | ICD-10-CM | POA: Diagnosis not present

## 2016-06-21 DIAGNOSIS — Z7984 Long term (current) use of oral hypoglycemic drugs: Secondary | ICD-10-CM | POA: Diagnosis not present

## 2016-06-21 DIAGNOSIS — N50811 Right testicular pain: Secondary | ICD-10-CM | POA: Diagnosis present

## 2016-06-21 LAB — CBC WITH DIFFERENTIAL/PLATELET
Basophils Absolute: 0 10*3/uL (ref 0.0–0.1)
Basophils Relative: 0 %
Eosinophils Absolute: 0 10*3/uL (ref 0.0–0.7)
Eosinophils Relative: 0 %
HCT: 46.4 % (ref 39.0–52.0)
Hemoglobin: 15.9 g/dL (ref 13.0–17.0)
Lymphocytes Relative: 5 %
Lymphs Abs: 0.8 10*3/uL (ref 0.7–4.0)
MCH: 32.7 pg (ref 26.0–34.0)
MCHC: 34.3 g/dL (ref 30.0–36.0)
MCV: 95.5 fL (ref 78.0–100.0)
Monocytes Absolute: 2.1 10*3/uL — ABNORMAL HIGH (ref 0.1–1.0)
Monocytes Relative: 12 %
Neutro Abs: 14.7 10*3/uL — ABNORMAL HIGH (ref 1.7–7.7)
Neutrophils Relative %: 83 %
Platelets: 153 10*3/uL (ref 150–400)
RBC: 4.86 MIL/uL (ref 4.22–5.81)
RDW: 14.8 % (ref 11.5–15.5)
WBC: 17.7 10*3/uL — ABNORMAL HIGH (ref 4.0–10.5)

## 2016-06-21 LAB — URINALYSIS, ROUTINE W REFLEX MICROSCOPIC
Bilirubin Urine: NEGATIVE
Glucose, UA: NEGATIVE mg/dL
Ketones, ur: NEGATIVE mg/dL
Nitrite: NEGATIVE
Protein, ur: 100 mg/dL — AB
Specific Gravity, Urine: 1.017 (ref 1.005–1.030)
Squamous Epithelial / LPF: NONE SEEN
pH: 9 — ABNORMAL HIGH (ref 5.0–8.0)

## 2016-06-21 LAB — BASIC METABOLIC PANEL
Anion gap: 11 (ref 5–15)
BUN: 9 mg/dL (ref 6–20)
CO2: 21 mmol/L — ABNORMAL LOW (ref 22–32)
Calcium: 9.2 mg/dL (ref 8.9–10.3)
Chloride: 104 mmol/L (ref 101–111)
Creatinine, Ser: 1.2 mg/dL (ref 0.61–1.24)
GFR calc Af Amer: >60 mL/min (ref >60)
GFR calc non Af Amer: >60 mL/min (ref >60)
Glucose, Bld: 134 mg/dL — ABNORMAL HIGH (ref 65–99)
Potassium: 3.8 mmol/L (ref 3.5–5.1)
Sodium: 136 mmol/L (ref 135–145)

## 2016-06-21 LAB — URINE CULTURE: Colony Count: >=100000

## 2016-06-21 MED ORDER — HYDROCODONE-ACETAMINOPHEN 5-325 MG PO TABS: 1.0000 | ORAL_TABLET | Freq: Four times a day (QID) | ORAL | 0 refills | Status: DC | PRN

## 2016-06-21 MED ORDER — MORPHINE SULFATE (PF) 4 MG/ML IV SOLN
4.0000 mg | Freq: Once | INTRAVENOUS | Status: AC
  Administered 2016-06-21: 4 mg via INTRAVENOUS
  Filled 2016-06-21: qty 1

## 2016-06-21 MED ORDER — CIPROFLOXACIN HCL 500 MG PO TABS: 500.0000 mg | ORAL_TABLET | Freq: Two times a day (BID) | ORAL | 0 refills | Status: DC

## 2016-06-21 MED ORDER — CIPROFLOXACIN HCL 500 MG PO TABS
500.0000 mg | ORAL_TABLET | Freq: Once | ORAL | Status: AC
  Administered 2016-06-21: 500 mg via ORAL
  Filled 2016-06-21: qty 1

## 2016-06-21 NOTE — ED Triage Notes (Addendum)
PT states R testicular pain since yesterday evening. Pain radiates to lower back and urine is dark. He saw his PCP for a regular appointment and was placed on sulfa meds which he has been taking.  Finished radiation for prostate ca last wed.  Pt is diaphoretic d/t pain.

## 2016-06-21 NOTE — ED Provider Notes (Signed)
[facility] DEPT Provider Note   CSN: [id number] Arrival date & time: 06/21/16  0846     History   Chief Complaint Chief Complaint  Patient presents with  . Testicle Pain    HPI Peter Dunlap is a 61 y.o. male.  Patient with PMH prostate cancer, HTN, HL, presents to the ED with a chief complaint of right testicle pain.  He states that he noticed the swelling yesterday.  Was seen by his PCP and given some antibiotics, but states that they have not helped.  He reports having associated dysuria, hematuria, and decreased urine output.  He denies any fevers, chills, nausea, or vomiting.  The symptoms are worsened with palpation.  There are no other associated symptoms.  Patient states that he recently completed radiation therapy for prostate cancer.   The history is provided by the patient. No language interpreter was used.    Past Medical History:  Diagnosis Date  . Arteriosclerotic cardiovascular disease (ASCVD) 2005   catheterization in 10/2010:50% mid LAD, diffuse distal disease, circumflex irregularities, large dominant RCA with a 50% ostial, 70% distal, 60% posterolateral and 70% PDA; normal EF  . Arthritis   . Benign prostatic hypertrophy   . Cerebrovascular disease 2010   R. carotid endarterectomy; Duplex in 10/2010-widely patent ICAs, subtotal left vertebral-not thought to be contributing to symptoms  . Cervical spine disease    CT in 2012-advanced degeneration and spondylosis with moderate spinal stenosis at C3-C6  . Depression   . Erectile dysfunction   . Gastroesophageal reflux disease   . H/O hiatal hernia   . H/O: substance abuse    Cocaine, marijuana, alcohol.  Quit 2013.   [name] Hyperlipidemia   . Hypertension   . Non-ST elevation myocardial infarction (NSTEMI), initial episode of care [facility]) 12/02/2013   DES LAD  . Obesity   . Prostate cancer ([place])   . Sleep apnea    CPAP  . Tachy-brady syndrome ([place])    a. s/p STJ dual chamber PPM   . Thyroid disease     . Tobacco abuse    Quit 2014    Patient Active Problem List   Diagnosis Date Noted  . Prostate cancer ([place]) 03/10/2016  . Right-sided extracranial carotid artery stenosis 11/25/2015  . Former smoker 11/25/2015  . History of right-sided carotid endarterectomy 11/25/2015  . Encounter for postoperative carotid endarterectomy surveillance 11/25/2015  . Paroxysmal atrial fibrillation (HCC)   . Persistent atrial fibrillation ([place]) 08/05/2015  . Pacemaker-St Jude 09/18/2014  . Tachy-brady syndrome ([place]) 08/19/2014  . Chest pain 08/04/2014  . Atrial flutter with rapid ventricular response ([place])   . Atrial fibrillation ([place]) 07/31/2014  . Chronic insomnia 07/24/2014  . OA (osteoarthritis) of knee 05/26/2014  . S/P subdural hematoma evacuation-Nov 2015 04/10/2014  . 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 Chronic diastolic heart failure ([place]) 03/04/2014  . 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 Gout 02/17/2014  . OSA (obstructive sleep apnea) 02/17/2014  . Obesity (BMI 30-39.9) 12/04/2013  . Diabetes mellitus type 2 in obese ([place]) 12/03/2013  . Aftercare following surgery of the circulatory system, [place] 11/13/2013  . Hypertrophic cardiomyopathy ([place]) 09/07/2011  . Hypertension   . CAD- LAD DES 12/03/13, patent Feb 2016   . Cerebrovascular disease   . Hyperlipidemia   . ERECTILE DYSFUNCTION, ORGANIC 08/11/2010  . PSVT (paroxysmal supraventricular tachycardia) ([place]) 06/03/2008  . PERIPHERAL NEUROPATHY 01/28/2008  . BPH (benign prostatic hypertrophy) 05/11/2006  . GERD 04/23/2006  . BACK PAIN 04/23/2006  Past Surgical History:  Procedure Laterality Date  . BRAIN SURGERY  2015   hematoma evacuation  . BURR HOLE Right 04/13/2014   Procedure: Haskell Flirt;  Surgeon: Charlie Pitter, MD;  Location: [facility] NEURO ORS;  Service: Neurosurgery;  Laterality: Right;  . CAROTID ENDARTERECTOMY Right Feb. 25, 2010    CEA  . CORONARY ANGIOPLASTY WITH STENT  PLACEMENT  12/03/2013   LAD 90%-->0% W/ Promus Premier DES 3.0 mm x 16 mm, CFX OK, RCA 40%, EF 70-75%  . LEFT ATRIAL APPENDAGE OCCLUSION N/A 08/05/2015   Procedure: LEFT ATRIAL APPENDAGE OCCLUSION;  Surgeon: [name], MD;  Location: [facility] CV LAB;  Service: Cardiovascular;  Laterality: N/A;  . LEFT HEART CATHETERIZATION WITH CORONARY ANGIOGRAM Left 12/03/2013   Procedure: LEFT HEART CATHETERIZATION WITH CORONARY ANGIOGRAM;  Surgeon: Leonie Man, MD;  Location: [facility] CATH LAB;  Service: Cardiovascular;  Laterality: Left;  . LEFT HEART CATHETERIZATION WITH CORONARY ANGIOGRAM N/A 01/26/2014   Procedure: LEFT HEART CATHETERIZATION WITH CORONARY ANGIOGRAM;  Surgeon: Jettie Booze, MD;  Location: [facility] CATH LAB;  Service: Cardiovascular;  Laterality: N/A;  . LEFT HEART CATHETERIZATION WITH CORONARY ANGIOGRAM N/A 08/03/2014   Procedure: LEFT HEART CATHETERIZATION WITH CORONARY ANGIOGRAM;  Surgeon: Burnell Blanks, MD;  Location: [facility] CATH LAB;  Service: Cardiovascular;  Laterality: N/A;  . PERCUTANEOUS CORONARY STENT INTERVENTION (PCI-S)  12/03/2013   Procedure: PERCUTANEOUS CORONARY STENT INTERVENTION (PCI-S);  Surgeon: Leonie Man, MD;  Location: [facility] CATH LAB;  Service: Cardiovascular;;  . PERMANENT PACEMAKER INSERTION N/A 09/18/2014   Procedure: PERMANENT PACEMAKER INSERTION;  Surgeon: [name], MD;  Location: [facility] CATH LAB;  Service: Cardiovascular;  Laterality: N/A;  . RADIOFREQUENCY ABLATION  2005   for PSVT  . TEE WITHOUT CARDIOVERSION N/A 07/27/2015   Procedure: TRANSESOPHAGEAL ECHOCARDIOGRAM (TEE);  Surgeon: Lelon Perla, MD;  Location: [facility] ENDOSCOPY;  Service: Cardiovascular;  Laterality: N/A;  . TEE WITHOUT CARDIOVERSION N/A 09/15/2015   Procedure: TRANSESOPHAGEAL ECHOCARDIOGRAM (TEE);  Surgeon: Thayer Headings, MD;  Location: [facility];  Service: Cardiovascular;  Laterality: N/A;       Home Medications    Prior to Admission medications   Medication Sig Start Date  End Date Taking? Authorizing Provider  allopurinol (ZYLOPRIM) 100 MG tablet TAKE 1 TABLET BY MOUTH EVERY DAY Patient not taking: Reported on 06/19/2016 10/27/15   [name], MD  aspirin 81 MG chewable tablet Chew by mouth daily.    Historical Provider, MD  colchicine 0.6 MG tablet Take 1 tablet (0.6 mg total) by mouth daily. Patient not taking: Reported on 06/19/2016 11/29/15   Lorayne Bender, PA-C  DEXILANT 60 MG capsule TAKE 1 CAPSULE BY MOUTH EVERY DAY 06/16/16   [name], MD  furosemide (LASIX) 20 MG tablet TAKE 3 TABLETS BY MOUTH DAILY 05/10/16   [name], MD  isosorbide mononitrate (IMDUR) 60 MG 24 hr tablet TAKE 1 TABLET BY MOUTH EVERY DAY 02/10/15   Burtis Junes, NP  levothyroxine (SYNTHROID, LEVOTHROID) 200 MCG tablet Take 1 tablet (200 mcg total) by mouth daily before breakfast. 06/12/16   Cassandria Anger, MD  Magnesium Oxide 400 MG CAPS Take 1 capsule (400 mg total) by mouth 2 (two) times daily. 08/27/15   Amber Sena Slate, NP  metFORMIN (GLUCOPHAGE) 500 MG tablet TAKE 1 TABLET(500 MG) BY MOUTH TWICE DAILY WITH A MEAL 05/31/16   Cassandria Anger, MD  nitroGLYCERIN (NITROSTAT) 0.4 MG SL tablet PLACE 1 TABLET UNDER THE TONGUE EVERY  5 MINUTES AS NEEDED FOR CHEST PAIN 03/13/16   [name], MD  ONE TOUCH ULTRA TEST test strip USE TO TEST ONCE DAILY 07/23/15   [name], MD  oxyCODONE-acetaminophen (ROXICET) 5-325 MG tablet Take 1 tablet by mouth every 6 (six) hours as needed for severe pain. 06/19/16   [name], MD  potassium chloride SA (K-DUR,KLOR-CON) 20 MEQ tablet TAKE 1 TABLET BY MOUTH EVERY DAY 06/12/16   [name], MD  RAPAFLO 8 MG CAPS capsule TAKE 1 CAPSULE(8 MG) BY MOUTH DAILY 06/12/16   [name], MD  simvastatin (ZOCOR) 40 MG tablet TAKE 1 TABLET(40 MG) BY MOUTH DAILY 06/06/16   [name], MD  sotalol (BETAPACE) 160 MG tablet Take 1 tablet (160 mg total) by mouth 2 (two) times daily. 05/16/16   [name], MD    sulfamethoxazole-trimethoprim (BACTRIM DS,SEPTRA DS) 800-160 MG tablet Take 1 tablet by mouth 2 (two) times daily. 06/19/16   [name], MD  traZODone (DESYREL) 50 MG tablet TAKE 1/2 TO 1 TABLET BY MOUTH AT BEDTIME AS NEEDED FOR SLEEP 12/21/14   [name], MD    Family History Family History  Problem Relation Age of Onset  . Hypertension Mother     Cerebrovascular disease  . Diabetes Mother   . Coronary artery disease Father 44  . Diabetes type II Father   . Hypertension Father   . Heart attack Father   . Diabetes Brother   . Hypertension Brother   . Lung cancer Paternal Uncle   . Diabetes Sister   . Hypertension Sister   . Heart attack Sister 3  . Cancer Sister     leukemia  . Cancer Maternal Uncle     breast  . Cancer Maternal Grandmother     breast    Social History Social History  Substance Use Topics  . Smoking status: Former Smoker    Packs/day: 1.00    Years: 40.00    Types: Cigarettes    Start date: 10/20/1972    Quit date: 10/10/2012  . Smokeless tobacco: Never Used     Comment: Quit in May.   . Alcohol use No     Comment: former drinker-- sober since 2013.      Allergies   Lactose intolerance (gi)   Review of Systems Review of Systems  All other systems reviewed and are negative.    Physical Exam Updated Vital Signs BP 111/76   Pulse 86   Temp 98.7 F (37.1 C) (Oral)   Resp 18   Ht 5\' 11"  (1.803 m)   Wt (!) 137.4 kg   SpO2 96%   BMI 42.26 kg/m   Physical Exam  Constitutional: He is oriented to person, place, and time. He appears well-developed and well-nourished.  HENT:  Head: Normocephalic and atraumatic.  Eyes: Conjunctivae and EOM are normal. Pupils are equal, round, and reactive to light. Right eye exhibits no discharge. Left eye exhibits no discharge. No scleral icterus.  Neck: Normal range of motion. Neck supple. No JVD present.  Cardiovascular: Normal rate, regular rhythm and normal heart sounds.  Exam reveals no  gallop and no friction rub.   No murmur heard. Pulmonary/Chest: Effort normal and breath sounds normal. No respiratory distress. He has no wheezes. He has no rales. He exhibits no tenderness.  Abdominal: Soft. He exhibits no distension and no mass. There is no tenderness. There is no rebound and no guarding.  Genitourinary:  Genitourinary  Comments: Right testicle is tender and mildly swollen No visible penile discharge No masses or lesions  DRE Prostate firm but tender  Musculoskeletal: Normal range of motion. He exhibits no edema or tenderness.  Neurological: He is alert and oriented to person, place, and time.  Skin: Skin is warm and dry.  Psychiatric: He has a normal mood and affect. His behavior is normal. Judgment and thought content normal.  Nursing note and vitals reviewed.    ED Treatments / Results  Labs (all labs ordered are listed, but only abnormal results are displayed) Labs Reviewed  URINALYSIS, ROUTINE W REFLEX MICROSCOPIC - Abnormal; Notable for the following:       Result Value   Color, Urine AMBER (*)    APPearance CLOUDY (*)    pH 9.0 (*)    Hgb urine dipstick LARGE (*)    Protein, ur 100 (*)    Leukocytes, UA LARGE (*)    Bacteria, UA RARE (*)    All other components within normal limits  CBC WITH DIFFERENTIAL/PLATELET - Abnormal; Notable for the following:    WBC 17.7 (*)    Neutro Abs 14.7 (*)    Monocytes Absolute 2.1 (*)    All other components within normal limits  BASIC METABOLIC PANEL - Abnormal; Notable for the following:    CO2 21 (*)    Glucose, Bld 134 (*)    All other components within normal limits    EKG  EKG Interpretation None       Radiology US Scrotum  Result Date: 06/21/2016 CLINICAL DATA:  Right testicular pain. EXAM: SCROTAL ULTRASOUND DOPPLER ULTRASOUND OF THE TESTICLES TECHNIQUE: Complete ultrasound examination of the testicles, epididymis, and other scrotal structures was performed. Color and spectral Doppler ultrasound  were also utilized to evaluate blood flow to the testicles. COMPARISON:  04/23/2016 FINDINGS: Right testicle Measurements: 3.6 x 2.4 x 2.7 cm. No mass or microlithiasis visualized. Left testicle Measurements: 3.7 x 2.6 x 2.4 cm. 2 mm calcification in the left testis is again noted, unchanged. Right epididymis:  Normal in size and appearance. Left epididymis: There are several cysts identified. The largest measures 4 mm. Hydrocele:  Bilateral hydroceles, left greater than right. Pulsed Doppler interrogation of both testes demonstrates normal low resistance arterial and venous waveforms bilaterally. IMPRESSION: 1. No evidence for testicular torsion or mass 2. Bilateral hydroceles, left greater than right. 3. Left epididymal cysts. 4. Similar appearance of left testicular calcification, likely the sequelae of prior trauma or infection. Electronically Signed   By: Kerby Moors M.D.   On: 06/21/2016 11:04   [place] Art/ven Flow Abd Pelv Doppler  Result Date: 06/21/2016 CLINICAL DATA:  Right testicular pain. EXAM: SCROTAL ULTRASOUND DOPPLER ULTRASOUND OF THE TESTICLES TECHNIQUE: Complete ultrasound examination of the testicles, epididymis, and other scrotal structures was performed. Color and spectral Doppler ultrasound were also utilized to evaluate blood flow to the testicles. COMPARISON:  04/23/2016 FINDINGS: Right testicle Measurements: 3.6 x 2.4 x 2.7 cm. No mass or microlithiasis visualized. Left testicle Measurements: 3.7 x 2.6 x 2.4 cm. 2 mm calcification in the left testis is again noted, unchanged. Right epididymis:  Normal in size and appearance. Left epididymis: There are several cysts identified. The largest measures 4 mm. Hydrocele:  Bilateral hydroceles, left greater than right. Pulsed Doppler interrogation of both testes demonstrates normal low resistance arterial and venous waveforms bilaterally. IMPRESSION: 1. No evidence for testicular torsion or mass 2. Bilateral hydroceles, left greater than right.  3. Left epididymal cysts. 4. Similar  appearance of left testicular calcification, likely the sequelae of prior trauma or infection. Electronically Signed   By: Kerby Moors M.D.   On: 06/21/2016 11:04    Procedures Procedures (including critical care time)  Medications Ordered in ED Medications  morphine 4 MG/ML injection 4 mg (not administered)     Initial Impression / Assessment and Plan / ED Course  I have reviewed the triage vital signs and the nursing notes.  Pertinent labs & imaging results that were available during my care of the patient were reviewed by me and considered in my medical decision making (see chart for details).  Clinical Course     Patient with right-sided testicle pain. Also reports hematuria and some dysuria. Recently finished radiation therapy for prostate cancer.  Was seen by his primary care doctor yesterday and prescribed Bactrim for UTI. Reports persistent symptoms now. Laboratory workup remarkable for leukocytosis 17.7.  Vital signs are stable. Ultrasound as above, no evidence of torsion or epididymitis. Prostate is firm but tender. Will broaden antibiotic coverage and extend the length to cover for acute prostatitis. Recommend close follow-up with urology.  Patient discussed with Dr. Kathrynn Humble, who agrees with the plan.  Final Clinical Impressions(s) / ED Diagnoses   Final diagnoses:  Prostatitis, unspecified prostatitis type    New Prescriptions New Prescriptions   CIPROFLOXACIN (CIPRO) 500 MG TABLET    Take 1 tablet (500 mg total) by mouth 2 (two) times daily.   HYDROCODONE-ACETAMINOPHEN (NORCO/VICODIN) 5-325 MG TABLET    Take 1-2 tablets by mouth every 6 (six) hours as needed.     Montine Circle, PA-C 06/21/16 [place], MD 06/21/16 1429

## 2016-06-21 NOTE — ED Notes (Signed)
Pt in US

## 2016-06-23 ENCOUNTER — Telehealth: Payer: Self-pay | Admitting: Family Medicine

## 2016-06-26 ENCOUNTER — Telehealth: Payer: Self-pay | Admitting: Internal Medicine

## 2016-06-26 MED ORDER — CEPHALEXIN 500 MG PO CAPS: 500.0000 mg | ORAL_CAPSULE | Freq: Four times a day (QID) | ORAL | 0 refills | Status: DC

## 2016-06-26 NOTE — Telephone Encounter (Signed)
Will forward to our pharmacist, Fuller Canada.

## 2016-06-26 NOTE — Telephone Encounter (Signed)
Received call from patient wife, Raquel Sarna.   Reports that patient was seen in ER for Prostatitis. Reports that patient was given Cipro. Patient is continuing Bactrim given by MD on 06/19/2016.  States that she was counseled by pharmacist to seek Cards recommendation in regards to Cipro and Sotalol. Cards recommendations are as follows:  Megan E Supple, RPH  Cipro is contraindicated with sotalol due to risk for QTc prolongation. Most recent QTc less than a month ago was elevated at 513. Recommend that pt be put on a different antibiotic.   Urine culture from 06/19/2016 shows UTI with E. Coli, resistant to Bactrim.   MD please advise.

## 2016-06-26 NOTE — Telephone Encounter (Signed)
Keflex 500 QID x 7 days

## 2016-06-26 NOTE — Telephone Encounter (Signed)
Called patient's wife (DPR) about Megan's recommendations. Patient's wife stated she would call patient's PCP and discuss.

## 2016-06-26 NOTE — Telephone Encounter (Signed)
New message      Pt was seen in the ER last Saturday for a infection (prostate?).  The pharmacist told pt to check with Dr Lovena Le to make sure prescription does not interfere with his sotalol.  He is taking ciprofloxacin 500mg .  Please call

## 2016-06-26 NOTE — Telephone Encounter (Signed)
Cipro is contraindicated with sotalol due to risk for QTc prolongation. Most recent QTc less than a month ago was elevated at 513. Recommend that pt be put on a different antibiotic.

## 2016-06-26 NOTE — Telephone Encounter (Signed)
Call placed to patient and patient wife Raquel Sarna made aware.

## 2016-06-27 ENCOUNTER — Other Ambulatory Visit: Payer: Self-pay | Admitting: Gastroenterology

## 2016-06-27 DIAGNOSIS — R1310 Dysphagia, unspecified: Secondary | ICD-10-CM | POA: Diagnosis not present

## 2016-06-27 DIAGNOSIS — K219 Gastro-esophageal reflux disease without esophagitis: Secondary | ICD-10-CM | POA: Diagnosis not present

## 2016-07-03 ENCOUNTER — Ambulatory Visit
Admission: RE | Admit: 2016-07-03 | Discharge: 2016-07-03 | Disposition: A | Discharge status: Home / Self Care | Payer: Medicare Other | Source: Ambulatory Visit | Attending: Gastroenterology | Admitting: Gastroenterology

## 2016-07-03 DIAGNOSIS — R1310 Dysphagia, unspecified: Secondary | ICD-10-CM

## 2016-07-03 DIAGNOSIS — K219 Gastro-esophageal reflux disease without esophagitis: Secondary | ICD-10-CM | POA: Diagnosis not present

## 2016-07-04 ENCOUNTER — Ambulatory Visit (INDEPENDENT_AMBULATORY_CARE_PROVIDER_SITE_OTHER): Payer: Medicare Other | Admitting: Urology

## 2016-07-04 DIAGNOSIS — C61 Malignant neoplasm of prostate: Secondary | ICD-10-CM

## 2016-07-04 DIAGNOSIS — N401 Enlarged prostate with lower urinary tract symptoms: Secondary | ICD-10-CM

## 2016-07-04 DIAGNOSIS — N451 Epididymitis: Secondary | ICD-10-CM

## 2016-07-07 ENCOUNTER — Other Ambulatory Visit: Payer: Self-pay | Admitting: Family Medicine

## 2016-07-11 ENCOUNTER — Encounter: Payer: Medicare Other | Admitting: Family Medicine

## 2016-07-11 ENCOUNTER — Encounter: Payer: Self-pay | Admitting: Family Medicine

## 2016-07-11 NOTE — Progress Notes (Signed)
Pt did not need to be seen. No concerns, was just seen on 1/15  Error on our part kept appt date. No charge for visit

## 2016-07-11 NOTE — Patient Instructions (Signed)
Keep appt in May.

## 2016-07-12 ENCOUNTER — Other Ambulatory Visit: Payer: Self-pay

## 2016-07-12 ENCOUNTER — Other Ambulatory Visit: Payer: Self-pay | Admitting: Family Medicine

## 2016-07-12 NOTE — Progress Notes (Signed)
Mr. Peter Captain. Dunlap 61 y.o. man with stage T1c, adenocarcinoma of the prostate with a Gleason's Score of 4+3, and a PSA of 9.09 one month FU.    Pain: He is currently not having pain.    URINARY: He denies  urinary frequency,urinary urgency, taking Lasix.  Reports he is emptying  his bladder, has a steady urinary stream.  Had a UTI in January took two  Antibiotics syarted with Bactrim and it was stopped and Keklex was started and completed. Pt states he gets up to urinate 2-4  times per night. BOWEL: He reports a  bowel movement everyday to everyother da, normal bowel movements.  Had a diarrhea stools x 3 days once over the past month and took imodium. Fatigue:None states he is not doing much physical activity. Appetite:Good eating two meals per day. Weight: Wt Readings from Last 3 Encounters:  07/20/16 297 lb 9.6 oz (135 kg)  07/11/16 (!) 300 lb 9.6 oz (136.4 kg)  06/21/16 (!) 303 lb (137.4 kg)   Urologist: Dalstedt,M.D. Pulse 76   Temp 97.8 F (36.6 C) (Oral)   Resp 20   Ht 5\' 11"  (1.803 m)   Wt 297 lb 9.6 oz (135 kg)   SpO2 99%   BMI 41.51 kg/m

## 2016-07-12 NOTE — Patient Outreach (Signed)
[facility]) Care Management  07/12/2016  Peter Dunlap 1955/06/15 [id number]   Telephone call to patient for monthly call.  Patient reports he is driving right now and will call back.   Plan: RN Health Coach will wait patient return call.  If no return call will attempt patient again in the month of February.    [name], RN, MSN [place] [phone]

## 2016-07-19 ENCOUNTER — Other Ambulatory Visit: Payer: Self-pay | Admitting: Family Medicine

## 2016-07-20 ENCOUNTER — Ambulatory Visit
Admission: RE | Admit: 2016-07-20 | Discharge: 2016-07-20 | Disposition: A | Discharge status: Home / Self Care | Payer: Medicare Other | Source: Ambulatory Visit | Attending: Radiation Oncology | Admitting: Radiation Oncology

## 2016-07-20 VITALS — HR 76 | Temp 97.8°F | Resp 20 | Ht 71.0 in | Wt 297.6 lb

## 2016-07-20 DIAGNOSIS — C61 Malignant neoplasm of prostate: Secondary | ICD-10-CM | POA: Diagnosis not present

## 2016-07-24 NOTE — Progress Notes (Signed)
Radiation Oncology         [phone] ________________________________  Name: Peter Dunlap MRN: [id number]  Date: 07/20/2016  DOB: 07/09/1955  Post Treatment Note  CC: Peter Blackbird, MD  Peter Rossetti, MD  Diagnosis:   61 y.o.gentleman with stage T1c, adenocarcinoma of the prostate with a Gleason's Score of 4+3, and a PSA of 9.09.      Interval Since Last Radiation: 1 month   04/17/2016 to 06/14/2016: The prostate was treated to 78 Gy in 40 fractions of 1.95 Gy weeks    Narrative:  The patient returns today for routine follow-up.  In brief he tolerated radiotherapy very well, he has plans to follow-up with urology in approximately 2 months. He was treated on 3 separate occasions for prostatitis/orchitis, and initially he was treated on 2 occasions prior to his radiotherapy, the last, following his radiotherapy completion.                        On review of systems, the patient states he is feeling less better since he's been completed his antibiotic therapy. He describes a heaviness testicles. He denies any difficulty with dysuria, hematuria, chest pain, shortness of breath. No other complaints or verbalized.  ALLERGIES:  is allergic to lactose intolerance (gi).  Meds: Current Outpatient Prescriptions  Medication Sig Dispense Refill  . allopurinol (ZYLOPRIM) 100 MG tablet TAKE 1 TABLET BY MOUTH EVERY DAY 90 tablet 6  . aspirin 81 MG chewable tablet Chew 81 mg by mouth daily.     . colchicine 0.6 MG tablet Take 1 tablet (0.6 mg total) by mouth daily. 15 tablet 0  . DEXILANT 60 MG capsule TAKE 1 CAPSULE BY MOUTH EVERY DAY 30 capsule 0  . furosemide (LASIX) 20 MG tablet TAKE 3 TABLETS BY MOUTH DAILY 90 tablet 9  . isosorbide mononitrate (IMDUR) 60 MG 24 hr tablet TAKE 1 TABLET BY MOUTH EVERY DAY 90 tablet 2  . levothyroxine (SYNTHROID, LEVOTHROID) 200 MCG tablet Take 1 tablet (200 mcg total) by mouth daily before breakfast. 30 tablet 6  . Magnesium Oxide 400 MG CAPS Take  1 capsule (400 mg total) by mouth 2 (two) times daily. 60 capsule 11  . metFORMIN (GLUCOPHAGE) 500 MG tablet TAKE 1 TABLET(500 MG) BY MOUTH TWICE DAILY WITH A MEAL (Patient taking differently: TAKE 1 TABLET(500 MG) BY MOUTH ONCE DAILY WITH A MEAL) 60 tablet 2  . nitroGLYCERIN (NITROSTAT) 0.4 MG SL tablet PLACE 1 TABLET UNDER THE TONGUE EVERY 5 MINUTES AS NEEDED FOR CHEST PAIN 25 tablet 1  . ONE TOUCH ULTRA TEST test strip USE TO TEST ONCE DAILY 50 each 11  . oxyCODONE-acetaminophen (ROXICET) 5-325 MG tablet Take 1 tablet by mouth every 6 (six) hours as needed for severe pain. 60 tablet 0  . potassium chloride SA (K-DUR,KLOR-CON) 20 MEQ tablet TAKE 1 TABLET BY MOUTH EVERY DAY 30 tablet 11  . RAPAFLO 8 MG CAPS capsule TAKE 1 CAPSULE(8 MG) BY MOUTH DAILY 30 capsule 2  . simvastatin (ZOCOR) 40 MG tablet TAKE 1 TABLET(40 MG) BY MOUTH DAILY 30 tablet 0  . sotalol (BETAPACE) 160 MG tablet Take 1 tablet (160 mg total) by mouth 2 (two) times daily. 60 tablet 6  . traZODone (DESYREL) 50 MG tablet TAKE 1/2 TO 1 TABLET BY MOUTH AT BEDTIME AS NEEDED FOR SLEEP 30 tablet 3   No current facility-administered medications for this encounter.    Facility-Administered Medications Ordered in Other Encounters  Medication Dose Route Frequency Provider Last Rate Last Dose  . 0.9 %  sodium chloride infusion   Intravenous Continuous [name], MD        Physical Findings:  height is 5\' 11"  (1.803 m) and weight is 297 lb 9.6 oz (135 kg). His oral temperature is 97.8 F (36.6 C). His pulse is 76. His respiration is 20 and oxygen saturation is 99%.  Pain Assessment Pain Score: 0-No pain/10 In general this is a well appearing African-American male in no acute distress. He's alert and oriented x4 and appropriate throughout the examination. Cardiopulmonary assessment is negative for acute distress and he exhibits normal effort.   Lab Findings: Lab Results  Component Value Date   WBC 17.7 (H) 06/21/2016   HGB 15.9  06/21/2016   HCT 46.4 06/21/2016   MCV 95.5 06/21/2016   PLT 153 06/21/2016     Radiographic Findings: Dg Esophagus  Result Date: 07/03/2016 CLINICAL DATA:  61 year old male with history of dysphagia. EXAM: ESOPHOGRAM / BARIUM SWALLOW / BARIUM TABLET STUDY TECHNIQUE: Combined double contrast and single contrast examination performed using effervescent crystals, thick barium liquid, and thin barium liquid. The patient was observed with fluoroscopy swallowing a 13 mm barium sulphate tablet. FLUOROSCOPY TIME:  Fluoroscopy Time:  1 minutes and 30 seconds Radiation Exposure Index (if provided by the fluoroscopic device): 166 mGy COMPARISON:  Esophagram 09/15/2013. FINDINGS: Rapid sequence images demonstrated normal oropharyngeal function. Double contrast images demonstrated a normal appearance of the esophageal mucosa. Multiple single swallow attempts were observed, which demonstrated generally normal esophageal motility, with minimal proximal escape. Full column esophagram demonstrated no esophageal mass, stricture or esophageal ring. No hiatal hernia. Water siphon test demonstrated a small amount of gastroesophageal reflux. A barium tablet was administered, which passed readily into the stomach. IMPRESSION: 1. Study was positive for a small amount of gastroesophageal reflux observed during the water siphon test. 2. Otherwise, essentially normal esophagram, as above. Electronically Signed   By: Vinnie Langton M.D.   On: 07/03/2016 11:10    Impression/Plan: 1.  61 y.o.gentleman with stage T1c, adenocarcinoma of the prostate with a Gleason's Score of 4+3, and a PSA of 9.09. The patient appears to be recovering from the effects of radiotherapy. We will continue to follow him as needed for moving forward and he will return for surveillance with urology in the next 2 months. 2. Prostatitis/orchiditis. The patient will continue to follow up with urology as well. He has been treated on several occasions, with  his last course being completely of antibiotics week ago. He will keep [place] informed any questions or concerns that arise prior to his next visit.     Carola Rhine, PAC

## 2016-07-25 ENCOUNTER — Other Ambulatory Visit: Payer: Self-pay

## 2016-07-25 NOTE — Patient Outreach (Signed)
[facility]) Care Management  [name]  07/25/2016   Peter Dunlap 06-07-1955 [id number]  Subjective: Telephone call to patient for monthly call. Patient reports he is doing ok. He having urinary tract infection recently attributed to his recent radiation.  Patient reports he finished his antibiotics last week.  Patient reports he saw the oncologist last week and no further treatments at this time.  Patient reports that his weight is down to around 290 lbs since the radiation. Discussed with patient the importance of balanced diet in order to aid in recovery from radiation.  He verbalized understanding.  Patient blood sugars have been good per patient.  No specific numbers.  Encouraged patient to maintain diet to keep sugars controlled. He verbalized understanding.   Objective:   Encounter Medications:  Outpatient Encounter Prescriptions as of 07/25/2016  Medication Sig  . allopurinol (ZYLOPRIM) 100 MG tablet TAKE 1 TABLET BY MOUTH EVERY DAY  . aspirin 81 MG chewable tablet Chew 81 mg by mouth daily.   . colchicine 0.6 MG tablet Take 1 tablet (0.6 mg total) by mouth daily.  [name] DEXILANT 60 MG capsule TAKE 1 CAPSULE BY MOUTH EVERY DAY  . furosemide (LASIX) 20 MG tablet TAKE 3 TABLETS BY MOUTH DAILY  . isosorbide mononitrate (IMDUR) 60 MG 24 hr tablet TAKE 1 TABLET BY MOUTH EVERY DAY  . levothyroxine (SYNTHROID, LEVOTHROID) 200 MCG tablet Take 1 tablet (200 mcg total) by mouth daily before breakfast.  . Magnesium Oxide 400 MG CAPS Take 1 capsule (400 mg total) by mouth 2 (two) times daily.  . metFORMIN (GLUCOPHAGE) 500 MG tablet TAKE 1 TABLET(500 MG) BY MOUTH TWICE DAILY WITH A MEAL (Patient taking differently: TAKE 1 TABLET(500 MG) BY MOUTH ONCE DAILY WITH A MEAL)  . nitroGLYCERIN (NITROSTAT) 0.4 MG SL tablet PLACE 1 TABLET UNDER THE TONGUE EVERY 5 MINUTES AS NEEDED FOR CHEST PAIN  . ONE TOUCH ULTRA TEST test strip USE TO TEST ONCE DAILY  . oxyCODONE-acetaminophen  (ROXICET) 5-325 MG tablet Take 1 tablet by mouth every 6 (six) hours as needed for severe pain.  . potassium chloride SA (K-DUR,KLOR-CON) 20 MEQ tablet TAKE 1 TABLET BY MOUTH EVERY DAY  . RAPAFLO 8 MG CAPS capsule TAKE 1 CAPSULE(8 MG) BY MOUTH DAILY  . simvastatin (ZOCOR) 40 MG tablet TAKE 1 TABLET(40 MG) BY MOUTH DAILY  . sotalol (BETAPACE) 160 MG tablet Take 1 tablet (160 mg total) by mouth 2 (two) times daily.  . traZODone (DESYREL) 50 MG tablet TAKE 1/2 TO 1 TABLET BY MOUTH AT BEDTIME AS NEEDED FOR SLEEP   Facility-Administered Encounter Medications as of 07/25/2016  Medication  . 0.9 %  sodium chloride infusion    Functional Status:  No flowsheet data found.  Fall/Depression Screening: PHQ 2/9 Scores 07/25/2016 07/20/2016 06/15/2016 05/11/2016 04/05/2016 03/06/2016 03/01/2016  PHQ - 2 Score 0 0 0 0 0 0 0  PHQ- 9 Score - - - - - - -    Assessment: Patient continues to benefit from health coach outreach for disease management and support.    Plan:  [facility] CM Care Plan Problem One   Flowsheet Row Most Recent Value  Care Plan Problem One  Diabetes Knowledge deficit  Role Documenting the Problem One  Health Coach  Care Plan for Problem One  Active  [facility] Long Term Goal (31-90 days)  Patient will verbalize care for diabetes such as diet  and blood sugar monitoring within the next 90 days.  [place]  Goal Start Date  07/25/16  Interventions for Problem One [place] retiterated with patient foods high in carbohydrates and reasons to monitor blood sugar regularly.      RN Health Coach will contact patient in the month of March and patient agrees to next outreach.  [name], RN, MSN [place] [phone]

## 2016-07-28 DIAGNOSIS — G4733 Obstructive sleep apnea (adult) (pediatric): Secondary | ICD-10-CM | POA: Diagnosis not present

## 2016-08-06 ENCOUNTER — Other Ambulatory Visit: Payer: Self-pay | Admitting: Nurse Practitioner

## 2016-08-07 ENCOUNTER — Telehealth: Payer: Self-pay | Admitting: *Deleted

## 2016-08-07 MED ORDER — OXYCODONE-ACETAMINOPHEN 5-325 MG PO TABS: 1.0000 | ORAL_TABLET | Freq: Four times a day (QID) | ORAL | 0 refills | Status: DC | PRN

## 2016-08-07 NOTE — Telephone Encounter (Signed)
Okay to refill? 

## 2016-08-07 NOTE — Telephone Encounter (Signed)
Order Providers   Prescribing Provider Encounter Provider  [name], MD [name], MD  Medication Detail    Disp Refills Start End   sotalol (BETAPACE) 160 MG tablet 60 tablet 6 05/16/2016    Sig - Route: Take 1 tablet (160 mg total) by mouth 2 (two) times daily. - Oral   E-Prescribing Status: Receipt confirmed by pharmacy (05/16/2016 2:34 PM EST)   Pharmacy   [id number] - [place], [address] AT [place]

## 2016-08-07 NOTE — Telephone Encounter (Signed)
Prescription printed and patient made aware to come to office to pick up on 08/08/2016.

## 2016-08-07 NOTE — Telephone Encounter (Signed)
Received call from patient.   Requested refill on Oxycodone.   Ok to refill??  Last office visit/ refill 06/19/2016.

## 2016-08-08 ENCOUNTER — Other Ambulatory Visit: Payer: Self-pay | Admitting: Family Medicine

## 2016-08-13 ENCOUNTER — Other Ambulatory Visit: Payer: Self-pay | Admitting: Nurse Practitioner

## 2016-08-14 ENCOUNTER — Other Ambulatory Visit: Payer: Self-pay | Admitting: "Endocrinology

## 2016-08-14 DIAGNOSIS — E890 Postprocedural hypothyroidism: Secondary | ICD-10-CM | POA: Diagnosis not present

## 2016-08-14 DIAGNOSIS — E119 Type 2 diabetes mellitus without complications: Secondary | ICD-10-CM | POA: Diagnosis not present

## 2016-08-14 LAB — COMPREHENSIVE METABOLIC PANEL
ALT: 12 U/L (ref 9–46)
AST: 22 U/L (ref 10–35)
Albumin: 3.7 g/dL (ref 3.6–5.1)
Alkaline Phosphatase: 78 U/L (ref 40–115)
BUN: 10 mg/dL (ref 7–25)
CO2: 28 mmol/L (ref 20–31)
Calcium: 9 mg/dL (ref 8.6–10.3)
Chloride: 104 mmol/L (ref 98–110)
Creat: 1.11 mg/dL (ref 0.70–1.25)
Glucose, Bld: 112 mg/dL — ABNORMAL HIGH (ref 65–99)
Potassium: 4.1 mmol/L (ref 3.5–5.3)
Sodium: 141 mmol/L (ref 135–146)
Total Bilirubin: 1.1 mg/dL (ref 0.2–1.2)
Total Protein: 6.9 g/dL (ref 6.1–8.1)

## 2016-08-14 LAB — TSH: TSH: 0.02 mIU/L — ABNORMAL LOW (ref 0.40–4.50)

## 2016-08-14 LAB — T4, FREE: Free T4: 1.3 ng/dL (ref 0.8–1.8)

## 2016-08-15 ENCOUNTER — Telehealth: Payer: Self-pay | Admitting: Cardiology

## 2016-08-15 ENCOUNTER — Ambulatory Visit (INDEPENDENT_AMBULATORY_CARE_PROVIDER_SITE_OTHER): Payer: Medicare Other | Admitting: *Deleted

## 2016-08-15 DIAGNOSIS — I495 Sick sinus syndrome: Secondary | ICD-10-CM | POA: Diagnosis not present

## 2016-08-15 LAB — HEMOGLOBIN A1C
Hgb A1c MFr Bld: 5.5 % (ref <5.7)
Mean Plasma Glucose: 111 mg/dL

## 2016-08-15 NOTE — Telephone Encounter (Signed)
Spoke with pt and reminded pt of remote transmission that is due today. Pt verbalized understanding.   

## 2016-08-15 NOTE — Progress Notes (Signed)
Remote pacemaker transmission.   

## 2016-08-16 ENCOUNTER — Other Ambulatory Visit: Payer: Self-pay | Admitting: Family Medicine

## 2016-08-16 ENCOUNTER — Encounter: Payer: Self-pay | Admitting: Cardiology

## 2016-08-18 LAB — CUP PACEART REMOTE DEVICE CHECK
Battery Remaining Longevity: 122 mo
Battery Remaining Percentage: 95.5 %
Battery Voltage: 2.99 V
Brady Statistic AP VP Percent: 1 %
Brady Statistic AP VS Percent: 7.7 %
Brady Statistic AS VP Percent: 1 %
Brady Statistic AS VS Percent: 91 %
Brady Statistic RA Percent Paced: 2.8 %
Brady Statistic RV Percent Paced: 26 %
Date Time Interrogation Session: 20180313181500
Implantable Lead Implant Date: 20160415
Implantable Lead Implant Date: 20160415
Implantable Lead Location: 753859
Implantable Lead Location: 753860
Implantable Pulse Generator Implant Date: 20160415
Lead Channel Impedance Value: 440 Ohm
Lead Channel Impedance Value: 580 Ohm
Lead Channel Pacing Threshold Amplitude: 0.25 V
Lead Channel Pacing Threshold Amplitude: 0.75 V
Lead Channel Pacing Threshold Pulse Width: 0.5 ms
Lead Channel Pacing Threshold Pulse Width: 0.5 ms
Lead Channel Sensing Intrinsic Amplitude: 12 mV
Lead Channel Sensing Intrinsic Amplitude: 5 mV
Lead Channel Setting Pacing Amplitude: 2 V
Lead Channel Setting Pacing Amplitude: 2.5 V
Lead Channel Setting Pacing Pulse Width: 0.5 ms
Lead Channel Setting Sensing Sensitivity: 2 mV
Pulse Gen Model: 2240
Pulse Gen Serial Number: 7756161

## 2016-08-21 ENCOUNTER — Encounter: Payer: Self-pay | Admitting: "Endocrinology

## 2016-08-21 ENCOUNTER — Telehealth: Payer: Self-pay | Admitting: Internal Medicine

## 2016-08-21 ENCOUNTER — Ambulatory Visit (INDEPENDENT_AMBULATORY_CARE_PROVIDER_SITE_OTHER): Payer: Medicare Other | Admitting: "Endocrinology

## 2016-08-21 VITALS — BP 117/72 | HR 77 | Ht 72.0 in | Wt 296.0 lb

## 2016-08-21 DIAGNOSIS — E782 Mixed hyperlipidemia: Secondary | ICD-10-CM | POA: Diagnosis not present

## 2016-08-21 DIAGNOSIS — I10 Essential (primary) hypertension: Secondary | ICD-10-CM

## 2016-08-21 DIAGNOSIS — E890 Postprocedural hypothyroidism: Secondary | ICD-10-CM | POA: Diagnosis not present

## 2016-08-21 DIAGNOSIS — E669 Obesity, unspecified: Secondary | ICD-10-CM

## 2016-08-21 DIAGNOSIS — E1169 Type 2 diabetes mellitus with other specified complication: Secondary | ICD-10-CM | POA: Diagnosis not present

## 2016-08-21 MED ORDER — LEVOTHYROXINE SODIUM 200 MCG PO TABS: 200.0000 ug | ORAL_TABLET | Freq: Every day | ORAL | 6 refills | Status: DC

## 2016-08-21 NOTE — Telephone Encounter (Signed)
LVM for [place] member asking him to fax over the EF%  Form or Letterhead. Made my Phone/Fax available for him if needed.

## 2016-08-21 NOTE — Telephone Encounter (Signed)
[place], Nurse Case Manager at  [facility] calling to find out last injection fraction and BP reading. He can be reached at [phone]. Thanks.

## 2016-08-21 NOTE — Telephone Encounter (Signed)
Will forward to Medical Records

## 2016-08-21 NOTE — Progress Notes (Signed)
Subjective:    Patient ID: Peter Dunlap, male    DOB: August 05, 1955, PCP [name], MD   Past Medical History:  Diagnosis Date  . Arteriosclerotic cardiovascular disease (ASCVD) 2005   catheterization in 10/2010:50% mid LAD, diffuse distal disease, circumflex irregularities, large dominant RCA with a 50% ostial, 70% distal, 60% posterolateral and 70% PDA; normal EF  . Arthritis   . Benign prostatic hypertrophy   . Cerebrovascular disease 2010   R. carotid endarterectomy; Duplex in 10/2010-widely patent ICAs, subtotal left vertebral-not thought to be contributing to symptoms  . Cervical spine disease    CT in 2012-advanced degeneration and spondylosis with moderate spinal stenosis at C3-C6  . Depression   . Erectile dysfunction   . Gastroesophageal reflux disease   . H/O hiatal hernia   . H/O: substance abuse    Cocaine, marijuana, alcohol.  Quit 2013.   [name] Hyperlipidemia   . Hypertension   . Non-ST elevation myocardial infarction (NSTEMI), initial episode of care [facility]) 12/02/2013   DES LAD  . Obesity   . Prostate cancer ([place])   . Sleep apnea    CPAP  . Tachy-brady syndrome ([place])    a. s/p STJ dual chamber PPM   . Thyroid disease   . Tobacco abuse    Quit 2014   Past Surgical History:  Procedure Laterality Date  . BRAIN SURGERY  2015   hematoma evacuation  . BURR HOLE Right 04/13/2014   Procedure: Haskell Flirt;  Surgeon: Charlie Pitter, MD;  Location: [place] NEURO ORS;  Service: Neurosurgery;  Laterality: Right;  . CAROTID ENDARTERECTOMY Right Feb. 25, 2010    CEA  . CORONARY ANGIOPLASTY WITH STENT PLACEMENT  12/03/2013   LAD 90%-->0% W/ Promus Premier DES 3.0 mm x 16 mm, CFX OK, RCA 40%, EF 70-75%  . LEFT ATRIAL APPENDAGE OCCLUSION N/A 08/05/2015   Procedure: LEFT ATRIAL APPENDAGE OCCLUSION;  Surgeon: [name], MD;  Location: [place] CV LAB;  Service: Cardiovascular;  Laterality: N/A;  . LEFT HEART CATHETERIZATION WITH CORONARY ANGIOGRAM Left 12/03/2013    Procedure: LEFT HEART CATHETERIZATION WITH CORONARY ANGIOGRAM;  Surgeon: Leonie Man, MD;  Location: [facility] CATH LAB;  Service: Cardiovascular;  Laterality: Left;  . LEFT HEART CATHETERIZATION WITH CORONARY ANGIOGRAM N/A 01/26/2014   Procedure: LEFT HEART CATHETERIZATION WITH CORONARY ANGIOGRAM;  Surgeon: Jettie Booze, MD;  Location: [facility] CATH LAB;  Service: Cardiovascular;  Laterality: N/A;  . LEFT HEART CATHETERIZATION WITH CORONARY ANGIOGRAM N/A 08/03/2014   Procedure: LEFT HEART CATHETERIZATION WITH CORONARY ANGIOGRAM;  Surgeon: Burnell Blanks, MD;  Location: [facility] CATH LAB;  Service: Cardiovascular;  Laterality: N/A;  . PERCUTANEOUS CORONARY STENT INTERVENTION (PCI-S)  12/03/2013   Procedure: PERCUTANEOUS CORONARY STENT INTERVENTION (PCI-S);  Surgeon: Leonie Man, MD;  Location: [facility] CATH LAB;  Service: Cardiovascular;;  . PERMANENT PACEMAKER INSERTION N/A 09/18/2014   Procedure: PERMANENT PACEMAKER INSERTION;  Surgeon: [name], MD;  Location: [facility] CATH LAB;  Service: Cardiovascular;  Laterality: N/A;  . RADIOFREQUENCY ABLATION  2005   for PSVT  . TEE WITHOUT CARDIOVERSION N/A 07/27/2015   Procedure: TRANSESOPHAGEAL ECHOCARDIOGRAM (TEE);  Surgeon: Lelon Perla, MD;  Location: [facility] ENDOSCOPY;  Service: Cardiovascular;  Laterality: N/A;  . TEE WITHOUT CARDIOVERSION N/A 09/15/2015   Procedure: TRANSESOPHAGEAL ECHOCARDIOGRAM (TEE);  Surgeon: Thayer Headings, MD;  Location: [facility] ENDOSCOPY;  Service: Cardiovascular;  Laterality: N/A;   Social History   Social History  . Marital status: Married    Spouse  name: N/A  . Number of children: 0  . Years of education: N/A   Occupational History  . Retired    Social History Main Topics  . Smoking status: Former Smoker    Packs/day: 1.00    Years: 40.00    Types: Cigarettes    Start date: 10/20/1972    Quit date: 10/10/2012  . Smokeless tobacco: Never Used     Comment: Quit in May.   . Alcohol use No     Comment: former drinker--  sober since 2013.   . Drug use: No     Comment: quit cocaine 10/2011  . Sexual activity: Yes    Partners: Female   Other Topics Concern  . None   Social History Narrative   Lives in [place].   Outpatient Encounter Prescriptions as of 08/21/2016  Medication Sig  . allopurinol (ZYLOPRIM) 100 MG tablet TAKE 1 TABLET BY MOUTH EVERY DAY  . aspirin 81 MG chewable tablet Chew 81 mg by mouth daily.   . colchicine 0.6 MG tablet Take 1 tablet (0.6 mg total) by mouth daily.  [name] DEXILANT 60 MG capsule TAKE 1 CAPSULE BY MOUTH EVERY DAY  . furosemide (LASIX) 20 MG tablet TAKE 3 TABLETS BY MOUTH DAILY  . isosorbide mononitrate (IMDUR) 60 MG 24 hr tablet TAKE 1 TABLET BY MOUTH EVERY DAY  . levothyroxine (SYNTHROID, LEVOTHROID) 200 MCG tablet Take 1 tablet (200 mcg total) by mouth daily before breakfast.  . MAGNESIUM-OXIDE 400 (241.3 Mg) MG tablet TAKE 1 TABLET BY MOUTH TWICE DAILY  . metFORMIN (GLUCOPHAGE) 500 MG tablet TAKE 1 TABLET(500 MG) BY MOUTH TWICE DAILY WITH A MEAL (Patient taking differently: TAKE 1 TABLET(500 MG) BY MOUTH ONCE DAILY WITH A MEAL)  . nitroGLYCERIN (NITROSTAT) 0.4 MG SL tablet PLACE 1 TABLET UNDER THE TONGUE EVERY 5 MINUTES AS NEEDED FOR CHEST PAIN  . ONE TOUCH ULTRA TEST test strip USE TO TEST ONCE DAILY  . oxyCODONE-acetaminophen (ROXICET) 5-325 MG tablet Take 1 tablet by mouth every 6 (six) hours as needed for severe pain.  . potassium chloride SA (K-DUR,KLOR-CON) 20 MEQ tablet TAKE 1 TABLET BY MOUTH EVERY DAY  . RAPAFLO 8 MG CAPS capsule TAKE 1 CAPSULE(8 MG) BY MOUTH DAILY  . simvastatin (ZOCOR) 40 MG tablet TAKE 1 TABLET(40 MG) BY MOUTH DAILY  . sotalol (BETAPACE) 160 MG tablet Take 1 tablet (160 mg total) by mouth 2 (two) times daily.  . traZODone (DESYREL) 50 MG tablet TAKE 1/2 TO 1 TABLET BY MOUTH AT BEDTIME AS NEEDED FOR SLEEP  . [DISCONTINUED] levothyroxine (SYNTHROID, LEVOTHROID) 200 MCG tablet Take 1 tablet (200 mcg total) by mouth daily before breakfast.    Facility-Administered Encounter Medications as of 08/21/2016  Medication  . 0.9 %  sodium chloride infusion   ALLERGIES: Allergies  Allergen Reactions  . Lactose Intolerance (Gi) Other (See Comments)    UPSET STOMACH    VACCINATION STATUS: Immunization History  Administered Date(s) Administered  . Influenza,inj,Quad PF,36+ Mos 02/17/2014, 03/09/2015, 03/10/2016  . Pneumococcal Polysaccharide-23 01/05/2014  . Tdap 12/19/2010    HPI  Peter Dunlap is a 61 - yr-old male patient with medical history as above.  He was given RAI therapy on March 16, 2014  . He is now more consistent  taking his levothyroxine. He is on levothyroxine 175 g by mouth every morning.  He feels better. - He admits that he forgets at least one day a week  - He also has controlled type 2 diabetes on metformin.  He denies cold intolerance.  Pt denies family history of thyroid dysfunction . He denies personal history of goiter. he has hx of a-fib, and CAD which required stent placement x 2.   Review of Systems  Constitutional:    no subjective hyperthermia/hypothermia. Eyes: no blurry vision, no xerophthalmia ENT: no sore throat, no nodules palpated in throat, no dysphagia/odynophagia, no hoarseness Cardiovascular: no CP/SOB/palpitations/leg swelling Respiratory: no cough/SOB Gastrointestinal: no N/V/D/C Musculoskeletal:  complains of right knee pain and left foot pain from gout.  Skin: no rashes Neurological: no tremors/numbness/tingling/dizziness Psychiatric: no depression/anxiety  Objective:    BP 117/72   Pulse 77   Ht 6' (1.829 m)   Wt 296 lb (134.3 kg)   SpO2 98%   BMI 40.14 kg/m   Wt Readings from Last 3 Encounters:  08/21/16 296 lb (134.3 kg)  07/20/16 297 lb 9.6 oz (135 kg)  07/11/16 (!) 300 lb 9.6 oz (136.4 kg)    Physical Exam Constitutional:    obese, in NAD Eyes: PERRLA, EOMI, no exophthalmos ENT: moist mucous membranes, no thyromegaly, no cervical  lymphadenopathy Cardiovascular: RRR, No MRG Respiratory: CTA B Gastrointestinal: abdomen soft, NT, ND, BS+ Musculoskeletal:  strength intact in all 4 Skin: moist, warm, no rashes Neurological: no tremor with outstretched hands, DTR normal in all 4   CMP     Component Value Date/Time   NA 141 08/14/2016 0808   NA 137 04/17/2016 1153   K 4.1 08/14/2016 0808   K 4.4 04/17/2016 1153   CL 104 08/14/2016 0808   CO2 28 08/14/2016 0808   CO2 22 04/17/2016 1153   GLUCOSE 112 (H) 08/14/2016 0808   GLUCOSE 115 04/17/2016 1153   BUN 10 08/14/2016 0808   BUN 13.6 04/17/2016 1153   CREATININE 1.11 08/14/2016 0808   CREATININE 1.2 04/17/2016 1153   CALCIUM 9.0 08/14/2016 0808   CALCIUM 9.6 04/17/2016 1153   PROT 6.9 08/14/2016 0808   PROT 7.9 04/17/2016 1153   ALBUMIN 3.7 08/14/2016 0808   ALBUMIN 3.2 (L) 04/17/2016 1153   AST 22 08/14/2016 0808   AST 20 04/17/2016 1153   ALT 12 08/14/2016 0808   ALT 16 04/17/2016 1153   ALKPHOS 78 08/14/2016 0808   ALKPHOS 104 04/17/2016 1153   BILITOT 1.1 08/14/2016 0808   BILITOT 1.27 (H) 04/17/2016 1153   GFRNONAA >60 06/21/2016 0941   GFRNONAA 62 02/22/2016 0811   GFRAA >60 06/21/2016 0941   GFRAA 71 02/22/2016 0811     Diabetic Labs (most recent): Lab Results  Component Value Date   HGBA1C 5.5 08/14/2016   HGBA1C 5.6 06/19/2016   HGBA1C 5.9 (H) 02/22/2016     Lipid Panel ( most recent) Lipid Panel     Component Value Date/Time   CHOL 130 02/22/2016 0811   TRIG 122 02/22/2016 0811   HDL 36 (L) 02/22/2016 0811   CHOLHDL 3.6 02/22/2016 0811   VLDL 24 02/22/2016 0811   LDLCALC 70 02/22/2016 0811   Results for Peter Dunlap, Peter Dunlap (MRN [id number]) as of 08/21/2016 14:22  Ref. Range 08/14/2016 08:08  Glucose Latest Ref Range: 65 - 99 mg/dL 112 (H)  Hemoglobin A1C Latest Ref Range: <5.7 % 5.5  TSH Latest Ref Range: 0.40 - 4.50 mIU/L 0.02 (L)  T4,Free(Direct) Latest Ref Range: 0.8 - 1.8 ng/dL 1.3    Assessment & Plan:   1.  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  He will be continued on levothyroxine  200 g by mouth every  morning.   - We discussed about correct intake of levothyroxine, at fasting, with water, separated by at least 30 minutes from breakfast, and separated by more than 4 hours from calcium, iron, multivitamins, acid reflux medications (PPIs). -Patient is made aware of the fact that thyroid hormone replacement is needed for life, dose to be adjusted by periodic monitoring of thyroid function tests.   2. Diabetes mellitus type 2 in obese ([place]) -Controlled with A1c 5.5%.  He is advised to lower his metformin to 500 mg by mouth daily.   3. Essential hypertension - Improving /controlled. I advised him to be consistent in taking his blood pressure medications.  4. Obesity: I counseled him on low-carb diet , he is losing weight currently due to appropriate intake of his thyroid hormone replacement. He lost 20 pounds since September, 2017. - I advised patient to maintain close follow up with [name], MD for primary care needs. Follow up plan: Return in about 6 months (around 02/21/2017) for follow up with pre-visit labs.  Glade Lloyd, MD Phone: [phone]  Fax: [phone]   08/21/2016, 2:28 PM

## 2016-08-22 ENCOUNTER — Other Ambulatory Visit: Payer: Self-pay

## 2016-08-22 NOTE — Patient Outreach (Signed)
Peter Dunlap) Care Management  [place]  08/22/2016   Peter Dunlap Sep 10, 1955 [id number]  Subjective: Telephone call to patient for monthly call.  Patient reports he is doing good.  He saw Dr. Dorris Fetch on yesterday.  His A1c was 5.5 and metformin decreased to one per day.  He states that he was told by Dr. Dorris Fetch if he lost more weight that he probably could come off of the metformin.  Discussed with patient continuing diet and losing weight in order to completely come off of metformin.  He verbalized understanding.  Patient reports he has not had any problems with his prostate since last call.       Objective:   Encounter Medications:  Outpatient Encounter Prescriptions as of 08/22/2016  Medication Sig Note  . allopurinol (ZYLOPRIM) 100 MG tablet TAKE 1 TABLET BY MOUTH EVERY DAY   . aspirin 81 MG chewable tablet Chew 81 mg by mouth daily.    [name] DEXILANT 60 MG capsule TAKE 1 CAPSULE BY MOUTH EVERY DAY   . furosemide (LASIX) 20 MG tablet TAKE 3 TABLETS BY MOUTH DAILY   . isosorbide mononitrate (IMDUR) 60 MG 24 hr tablet TAKE 1 TABLET BY MOUTH EVERY DAY   . levothyroxine (SYNTHROID, LEVOTHROID) 200 MCG tablet Take 1 tablet (200 mcg total) by mouth daily before breakfast.   . MAGNESIUM-OXIDE 400 (241.3 Mg) MG tablet TAKE 1 TABLET BY MOUTH TWICE DAILY   . metFORMIN (GLUCOPHAGE) 500 MG tablet TAKE 1 TABLET(500 MG) BY MOUTH TWICE DAILY WITH A MEAL (Patient taking differently: TAKE 1 TABLET(500 MG) BY MOUTH ONCE DAILY WITH A MEAL)   . nitroGLYCERIN (NITROSTAT) 0.4 MG SL tablet PLACE 1 TABLET UNDER THE TONGUE EVERY 5 MINUTES AS NEEDED FOR CHEST PAIN   . ONE TOUCH ULTRA TEST test strip USE TO TEST ONCE DAILY   . oxyCODONE-acetaminophen (ROXICET) 5-325 MG tablet Take 1 tablet by mouth every 6 (six) hours as needed for severe pain.   . potassium chloride SA (K-DUR,KLOR-CON) 20 MEQ tablet TAKE 1 TABLET BY MOUTH EVERY DAY   . RAPAFLO 8 MG CAPS capsule TAKE 1 CAPSULE(8 MG) BY MOUTH  DAILY   . simvastatin (ZOCOR) 40 MG tablet TAKE 1 TABLET(40 MG) BY MOUTH DAILY   . sotalol (BETAPACE) 160 MG tablet Take 1 tablet (160 mg total) by mouth 2 (two) times daily.   . traZODone (DESYREL) 50 MG tablet TAKE 1/2 TO 1 TABLET BY MOUTH AT BEDTIME AS NEEDED FOR SLEEP   . colchicine 0.6 MG tablet Take 1 tablet (0.6 mg total) by mouth daily. (Patient not taking: Reported on 08/22/2016) 08/22/2016: Taking as needed.   Facility-Administered Encounter Medications as of 08/22/2016  Medication  . 0.9 %  sodium chloride infusion    Functional Status:  No flowsheet data found.  Fall/Depression Screening: PHQ 2/9 Scores 08/22/2016 07/25/2016 07/20/2016 06/15/2016 05/11/2016 04/05/2016 03/06/2016  PHQ - 2 Score 0 0 0 0 0 0 0  PHQ- 9 Score - - - - - - -    Assessment: Patient continues to benefit from health coach outreach for disease management and support.    Plan:  [facility] CM Care Plan Problem One     Most Recent Value  Care Plan Problem One  Diabetes Knowledge deficit  Role Documenting the Problem One  Health Coach  Care Plan for Problem One  Active  THN Long Term Goal (31-90 days)  Patient will verbalize care for diabetes such as diet  and blood sugar  monitoring within the next 90 days.  THN Long Term Goal Start Date  08/22/16  Interventions for Problem One [place] reinforced with patient foods high in carbohydrates and reasons to monitor blood sugar regularly.      RN Health Coach will contact patient in the month of April and patient agrees to next outreach.  [name], RN, MSN [place] [phone]

## 2016-08-27 ENCOUNTER — Other Ambulatory Visit: Payer: Self-pay | Admitting: Family Medicine

## 2016-08-29 ENCOUNTER — Ambulatory Visit (INDEPENDENT_AMBULATORY_CARE_PROVIDER_SITE_OTHER): Payer: Medicare Other | Admitting: Urology

## 2016-08-29 DIAGNOSIS — N401 Enlarged prostate with lower urinary tract symptoms: Secondary | ICD-10-CM | POA: Diagnosis not present

## 2016-08-29 DIAGNOSIS — C61 Malignant neoplasm of prostate: Secondary | ICD-10-CM

## 2016-09-04 ENCOUNTER — Telehealth: Payer: Self-pay | Admitting: *Deleted

## 2016-09-04 MED ORDER — OXYCODONE-ACETAMINOPHEN 5-325 MG PO TABS: 1.0000 | ORAL_TABLET | Freq: Four times a day (QID) | ORAL | 0 refills | Status: DC | PRN

## 2016-09-04 NOTE — Telephone Encounter (Signed)
okay

## 2016-09-04 NOTE — Telephone Encounter (Signed)
Prescription printed and patient made aware to come to office to pick up.  

## 2016-09-04 NOTE — Telephone Encounter (Signed)
Received call from patient.   Requested refill on Oxycodone.   Ok to refill??  Last office visit 06/19/2016.  Last refill 08/07/2016.

## 2016-09-06 ENCOUNTER — Other Ambulatory Visit: Payer: Self-pay | Admitting: Family Medicine

## 2016-09-06 NOTE — Telephone Encounter (Signed)
Medication refilled per protocol. 

## 2016-09-11 ENCOUNTER — Other Ambulatory Visit: Payer: Self-pay

## 2016-09-12 ENCOUNTER — Other Ambulatory Visit: Payer: Self-pay | Admitting: Internal Medicine

## 2016-09-15 ENCOUNTER — Other Ambulatory Visit: Payer: Self-pay | Admitting: Family Medicine

## 2016-09-20 ENCOUNTER — Other Ambulatory Visit: Payer: Self-pay
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
## 2016-10-02 ENCOUNTER — Other Ambulatory Visit: Payer: Self-pay

## 2016-10-02 NOTE — Patient Outreach (Signed)
[place] [facility]) Care Management  10/02/2016  Peter Dunlap [birth year] [id number]   2nd Telephone call to patient for monthly call.  No answer.  HIPAA compliant voice message left.  Plan: RN Health Coach will attempt patient in the month of May.  [name], RN, MSN [place] [phone]

## 2016-10-04 ENCOUNTER — Other Ambulatory Visit: Payer: Self-pay | Admitting: Family Medicine

## 2016-10-04 ENCOUNTER — Telehealth: Payer: Self-pay | Admitting: *Deleted

## 2016-10-04 MED ORDER — OXYCODONE-ACETAMINOPHEN 5-325 MG PO TABS: 1.0000 | ORAL_TABLET | Freq: Four times a day (QID) | ORAL | 0 refills | Status: DC | PRN

## 2016-10-04 NOTE — Telephone Encounter (Signed)
Okay to refill? 

## 2016-10-04 NOTE — Telephone Encounter (Signed)
Received call from patient.   Requested refill on Oxycodone.   Ok to refill??  Last office visit 06/19/2016.  Last refill 09/04/2016.

## 2016-10-04 NOTE — Telephone Encounter (Signed)
Prescription printed and patient made aware to come to office to pick up after 3 pm on 10/04/2016.

## 2016-10-12 ENCOUNTER — Other Ambulatory Visit: Payer: Self-pay
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
## 2016-10-16 ENCOUNTER — Other Ambulatory Visit: Payer: Self-pay | Admitting: Internal Medicine

## 2016-10-17 ENCOUNTER — Ambulatory Visit (INDEPENDENT_AMBULATORY_CARE_PROVIDER_SITE_OTHER): Payer: Medicare Other | Admitting: Family Medicine

## 2016-10-17 ENCOUNTER — Other Ambulatory Visit: Payer: Self-pay | Admitting: Nurse Practitioner

## 2016-10-17 ENCOUNTER — Encounter: Payer: Self-pay | Admitting: Family Medicine

## 2016-10-17 VITALS — BP 128/74 | HR 62 | Temp 98.2°F | Resp 14 | Ht 72.0 in | Wt 306.0 lb

## 2016-10-17 DIAGNOSIS — I209 Angina pectoris, unspecified: Secondary | ICD-10-CM

## 2016-10-17 DIAGNOSIS — R0600 Dyspnea, unspecified: Secondary | ICD-10-CM

## 2016-10-17 DIAGNOSIS — I2510 Atherosclerotic heart disease of native coronary artery without angina pectoris: Secondary | ICD-10-CM | POA: Diagnosis not present

## 2016-10-17 DIAGNOSIS — I5032 Chronic diastolic (congestive) heart failure: Secondary | ICD-10-CM

## 2016-10-17 DIAGNOSIS — G4733 Obstructive sleep apnea (adult) (pediatric): Secondary | ICD-10-CM | POA: Diagnosis not present

## 2016-10-17 DIAGNOSIS — E669 Obesity, unspecified: Secondary | ICD-10-CM | POA: Diagnosis not present

## 2016-10-17 DIAGNOSIS — M10071 Idiopathic gout, right ankle and foot: Secondary | ICD-10-CM

## 2016-10-17 DIAGNOSIS — E1169 Type 2 diabetes mellitus with other specified complication: Secondary | ICD-10-CM

## 2016-10-17 DIAGNOSIS — G894 Chronic pain syndrome: Secondary | ICD-10-CM | POA: Insufficient documentation

## 2016-10-17 DIAGNOSIS — R634 Abnormal weight loss: Secondary | ICD-10-CM

## 2016-10-17 LAB — BASIC METABOLIC PANEL
BUN: 10 mg/dL (ref 7–25)
CO2: 24 mmol/L (ref 20–31)
Calcium: 9.1 mg/dL (ref 8.6–10.3)
Chloride: 107 mmol/L (ref 98–110)
Creat: 1.19 mg/dL (ref 0.70–1.25)
Glucose, Bld: 114 mg/dL — ABNORMAL HIGH (ref 70–99)
Potassium: 4.1 mmol/L (ref 3.5–5.3)
Sodium: 142 mmol/L (ref 135–146)

## 2016-10-17 LAB — LIPID PANEL
Cholesterol: 126 mg/dL (ref <200)
HDL: 35 mg/dL — ABNORMAL LOW (ref >40)
LDL Cholesterol: 63 mg/dL (ref <100)
Total CHOL/HDL Ratio: 3.6 Ratio (ref <5.0)
Triglycerides: 138 mg/dL (ref <150)
VLDL: 28 mg/dL (ref <30)

## 2016-10-17 MED ORDER — OXYCODONE-ACETAMINOPHEN 7.5-325 MG PO TABS: 1.0000 | ORAL_TABLET | Freq: Three times a day (TID) | ORAL | 0 refills | Status: DC | PRN

## 2016-10-17 NOTE — Patient Instructions (Addendum)
Lasix to 80mg  x 5 days, drink fluids with this  Pain medication increased you can take 1-2 of your 5-325mg  percocet  F/U 6 months Physical

## 2016-10-17 NOTE — Assessment & Plan Note (Addendum)
No overt sign of fluid overload, but he has lived with this quite some time, will try increasing his diurietic to 80mg  for the next 5 days with potassium, check renal function  discussed drinking water with this  Avoiding fried foods, sweats, salt

## 2016-10-17 NOTE — Assessment & Plan Note (Addendum)
signigicant OA knees, back/ chronic foot pain Increase to 7.5mg  -325mg  percocet Since he just received script he can take 1-2 of his 5-325mg  for now

## 2016-10-17 NOTE — Assessment & Plan Note (Signed)
F/u cardiology, check lipids , on statin

## 2016-10-17 NOTE — Assessment & Plan Note (Signed)
Continues to beneift from use

## 2016-10-17 NOTE — Progress Notes (Signed)
   Subjective:    Patient ID: Peter Dunlap, male    DOB: 1955-10-15, 61 y.o.   MRN: [id number]  Patient presents for 4 month F/U (is fasting)  Patient here to follow-up chronic medical problems. He is followed by multiple specialists. Diabetes mellitus currently diet controlled his last A1c was 5.5% obtained by endocrinology. He is also following with him because of his thyroid disorder. He is currently on metformin 500 mg once a day Due for lipid panel his last LDL was 77 months ago.  Last visit that he was referred to gastroenterology secondary to some dysphagia and food getting hung, had barium swallow, told looked okay, advised to take Zantac as needed- swallowing is better   He is still followed by urology for his prostate cancer  Followed by cardiology for his coronary artery disease/cardiomyopathy/A. Fib   Weight gain - past few months, feels like he is not urinating as much , no CP no SOB, admits not eating correctly and not drinking water, feels like he has extra fluid. He does weight every day in the morning   Obstructive sleep apnea he is wearing his mask as prescribed, no difficulty with machine   Chronic pain- multiple joints,states percocet does not seem to help as much , like its wearing off    Review Of Systems:  GEN- denies fatigue, fever, weight loss,weakness, recent illness HEENT- denies eye drainage, change in vision, nasal discharge, CVS- denies chest pain, palpitations RESP- denies SOB, cough, wheeze ABD- denies N/V, change in stools, abd pain GU- denies dysuria, hematuria, dribbling, incontinence MSK- + joint pain, muscle aches, injury Neuro- denies headache, dizziness, syncope, seizure activity       Objective:    BP 128/74   Pulse 62   Temp 98.2 F (36.8 C) (Oral)   Resp 14   Ht 6' (1.829 m)   Wt (!) 306 lb (138.8 kg)   SpO2 98%   BMI 41.50 kg/m  GEN- NAD, alert and oriented x3 HEENT- PERRL, EOMI, non injected sclera, pink conjunctiva,  MMM, oropharynx clear Neck- Supple,noJVD CVS- RRR, no murmur RESP-CTAB ABD-NABS,soft,large abdomen, no fluid wave, NT EXT- No edema Pulses- Radial, 2+        Assessment & Plan:      Problem List Items Addressed This Visit    OSA (obstructive sleep apnea) - Primary    Continues to beneift from use      Morbid obesity (HCC)   Diabetes mellitus type 2 in obese (HCC) (Chronic)   Relevant Orders   Lipid panel   Basic metabolic panel   Chronic pain syndrome    signigicant OA knees, back/ chronic foot pain Increase to 7.5mg  -325mg  percocet Since he just received script he can take 1-2 of his 5-325mg  for now       Chronic diastolic heart failure (HCC)    No overt sign of fluid overload, but he has lived with this quite some time, will try increasing his diurietic to 80mg  for the next 5 days with potassium, check renal function  discussed drinking water with this  Avoiding fried foods, sweats, salt      CAD- LAD DES 12/03/13, patent Feb 2016 (Chronic)    F/u cardiology, check lipids , on statin         Note: This dictation was prepared with Dragon dictation along with smaller phrase technology. Any transcriptional errors that result from this process are unintentional.

## 2016-10-19 ENCOUNTER — Other Ambulatory Visit: Payer: Self-pay | Admitting: Nurse Practitioner

## 2016-10-19 MED ORDER — NITROGLYCERIN 0.4 MG SL SUBL: 0.4000 mg | SUBLINGUAL_TABLET | SUBLINGUAL | 3 refills | Status: DC | PRN

## 2016-10-19 NOTE — Addendum Note (Signed)
Addended by: Derl Barrow on: 10/19/2016 10:32 AM   Modules accepted: Orders

## 2016-10-26 DIAGNOSIS — G4733 Obstructive sleep apnea (adult) (pediatric): Secondary | ICD-10-CM | POA: Diagnosis not present

## 2016-10-31 ENCOUNTER — Other Ambulatory Visit (HOSPITAL_COMMUNITY)
Admission: AD | Admit: 2016-10-31 | Discharge: 2016-10-31 | Disposition: A | Discharge status: Home / Self Care | Payer: Medicare Other | Source: Skilled Nursing Facility | Attending: Urology | Admitting: Urology

## 2016-10-31 ENCOUNTER — Telehealth: Payer: Self-pay | Admitting: Internal Medicine

## 2016-10-31 ENCOUNTER — Ambulatory Visit (INDEPENDENT_AMBULATORY_CARE_PROVIDER_SITE_OTHER): Payer: Medicare Other | Admitting: Urology

## 2016-10-31 DIAGNOSIS — R300 Dysuria: Secondary | ICD-10-CM | POA: Diagnosis not present

## 2016-10-31 DIAGNOSIS — R310 Gross hematuria: Secondary | ICD-10-CM | POA: Diagnosis not present

## 2016-10-31 NOTE — Telephone Encounter (Signed)
Peter Dunlap ( Nurse Case Manager at [facility]) Is calling to get Blood Pressure and the date in which it was taken . Please cal

## 2016-11-02 LAB — URINE CULTURE: Culture: NO GROWTH

## 2016-11-03 NOTE — Telephone Encounter (Signed)
Called, unable to reach Grover at [facility] Phone extention not working.Left message to call back. TT [place].

## 2016-11-04 ENCOUNTER — Other Ambulatory Visit: Payer: Self-pay | Admitting: Family Medicine

## 2016-11-06 NOTE — Telephone Encounter (Signed)
Refill appropriate 

## 2016-11-14 ENCOUNTER — Other Ambulatory Visit: Payer: Self-pay

## 2016-11-14 ENCOUNTER — Ambulatory Visit (INDEPENDENT_AMBULATORY_CARE_PROVIDER_SITE_OTHER): Payer: Medicare Other | Admitting: *Deleted

## 2016-11-14 DIAGNOSIS — I495 Sick sinus syndrome: Secondary | ICD-10-CM | POA: Diagnosis not present

## 2016-11-14 NOTE — Patient Outreach (Signed)
[place] [facility]) Care Management  [place]  11/14/2016   Peter Dunlap Jan 01, 1956 [id number]  Subjective: Telephone call to patient for monthly call and case closure.  Patient reports that his sugars are ok and continues his diet.  Discussed with patient case closure as he has met his goals.  Patient in agreement and voices no concerns.    Objective:   Encounter Medications:  Outpatient Encounter Prescriptions as of 11/14/2016  Medication Sig Note  . allopurinol (ZYLOPRIM) 100 MG tablet TAKE 1 TABLET BY MOUTH EVERY DAY   . aspirin 81 MG chewable tablet Chew 81 mg by mouth daily.    [name] DEXILANT 60 MG capsule TAKE 1 CAPSULE BY MOUTH EVERY DAY   . furosemide (LASIX) 20 MG tablet TAKE 3 TABLETS BY MOUTH DAILY   . isosorbide mononitrate (IMDUR) 60 MG 24 hr tablet TAKE 1 TABLET BY MOUTH EVERY DAY   . levothyroxine (SYNTHROID, LEVOTHROID) 200 MCG tablet Take 1 tablet (200 mcg total) by mouth daily before breakfast.   . MAGNESIUM-OXIDE 400 (241.3 Mg) MG tablet TAKE 1 TABLET BY MOUTH TWICE DAILY   . metFORMIN (GLUCOPHAGE) 500 MG tablet TAKE 1 TABLET(500 MG) BY MOUTH TWICE DAILY WITH A MEAL (Patient taking differently: TAKE 1 TABLET(500 MG) BY MOUTH ONCE DAILY WITH A MEAL)   . nitroGLYCERIN (NITROSTAT) 0.4 MG SL tablet PLACE 1 TABLET UNDER THE TONGUE EVERY 5 MINUTES AS NEEDED FOR CHEST PAIN. CALL 911 AT THIRD DOSE IN 15 MINUTES   . ONE TOUCH ULTRA TEST test strip USE TO TEST ONCE DAILY   . oxyCODONE-acetaminophen (PERCOCET) 7.5-325 MG tablet Take 1 tablet by mouth every 8 (eight) hours as needed for severe pain.   . potassium chloride SA (K-DUR,KLOR-CON) 20 MEQ tablet TAKE 1 TABLET BY MOUTH EVERY DAY   . RAPAFLO 8 MG CAPS capsule TAKE 1 CAPSULE(8 MG) BY MOUTH DAILY   . simvastatin (ZOCOR) 40 MG tablet TAKE 1 TABLET(40 MG) BY MOUTH DAILY   . sotalol (BETAPACE) 160 MG tablet Take 1 tablet (160 mg total) by mouth 2 (two) times daily.   . colchicine 0.6 MG tablet Take 1 tablet  (0.6 mg total) by mouth daily. (Patient not taking: Reported on 10/17/2016) 08/22/2016: Taking as needed.  . isosorbide mononitrate (IMDUR) 60 MG 24 hr tablet Take 1 tablet (60 mg total) by mouth daily. (Patient not taking: Reported on 11/14/2016)   . traZODone (DESYREL) 50 MG tablet TAKE 1/2 TO 1 TABLET BY MOUTH AT BEDTIME AS NEEDED FOR SLEEP (Patient not taking: Reported on 10/17/2016)    Facility-Administered Encounter Medications as of 11/14/2016  Medication  . 0.9 %  sodium chloride infusion    Functional Status:  No flowsheet data found.  Fall/Depression Screening: Fall Risk  11/14/2016 10/17/2016 10/12/2016  Falls in the past year? No No No  Risk for fall due to : - - -   PHQ 2/9 Scores 11/14/2016 10/17/2016 10/12/2016 08/22/2016 07/25/2016 07/20/2016 06/15/2016  PHQ - 2 Score 0 0 0 0 0 0 0  PHQ- 9 Score - 2 - - - - -    Assessment: Patient has met goals of care and is ready for case closure.  Plan:  [facility] CM Care Plan Problem One     Most Recent Value  Care Plan Problem One  Diabetes Knowledge deficit  Role Documenting the Problem One  Health Coach  Care Plan for Problem One  Active  THN Long Term Goal   Patient will verbalize care  for diabetes such as diet  and blood sugar monitoring within the next 90 days.  THN Long Term Goal Start Date  10/12/16  THN Long Term Goal Met Date  11/14/16  Interventions for Problem One Long Term Goal  Patient continues to watch his diet.       RN Health Coach will send primary care closure letter and notify care management assistant of case status.   [name], RN, MSN [place] [phone]'

## 2016-11-14 NOTE — Progress Notes (Signed)
Remote pacemaker transmission.   

## 2016-11-15 ENCOUNTER — Encounter: Payer: Self-pay | Admitting: Cardiology

## 2016-11-15 LAB — CUP PACEART REMOTE DEVICE CHECK
Battery Remaining Longevity: 122 mo
Battery Remaining Percentage: 95.5 %
Battery Voltage: 2.99 V
Brady Statistic AP VP Percent: 1 %
Brady Statistic AP VS Percent: 8.5 %
Brady Statistic AS VP Percent: 1.2 %
Brady Statistic AS VS Percent: 90 %
Brady Statistic RA Percent Paced: 2.7 %
Brady Statistic RV Percent Paced: 31 %
Date Time Interrogation Session: 20180612065659
Implantable Lead Implant Date: 20160415
Implantable Lead Implant Date: 20160415
Implantable Lead Location: 753859
Implantable Lead Location: 753860
Implantable Pulse Generator Implant Date: 20160415
Lead Channel Impedance Value: 450 Ohm
Lead Channel Impedance Value: 600 Ohm
Lead Channel Pacing Threshold Amplitude: 0.25 V
Lead Channel Pacing Threshold Amplitude: 0.75 V
Lead Channel Pacing Threshold Pulse Width: 0.5 ms
Lead Channel Pacing Threshold Pulse Width: 0.5 ms
Lead Channel Sensing Intrinsic Amplitude: 12 mV
Lead Channel Sensing Intrinsic Amplitude: 5 mV
Lead Channel Setting Pacing Amplitude: 2 V
Lead Channel Setting Pacing Amplitude: 2.5 V
Lead Channel Setting Pacing Pulse Width: 0.5 ms
Lead Channel Setting Sensing Sensitivity: 2 mV
Pulse Gen Model: 2240
Pulse Gen Serial Number: 7756161

## 2016-11-17 ENCOUNTER — Encounter: Payer: Self-pay | Admitting: Family

## 2016-11-20 ENCOUNTER — Other Ambulatory Visit: Payer: Self-pay | Admitting: "Endocrinology

## 2016-11-20 DIAGNOSIS — E890 Postprocedural hypothyroidism: Secondary | ICD-10-CM | POA: Diagnosis not present

## 2016-11-20 LAB — TSH: TSH: 1.32 mIU/L (ref 0.40–4.50)

## 2016-11-20 LAB — T4, FREE: Free T4: 1.5 ng/dL (ref 0.8–1.8)

## 2016-11-23 ENCOUNTER — Telehealth: Payer: Self-pay | Admitting: *Deleted

## 2016-11-23 NOTE — Telephone Encounter (Signed)
Received call from patient.   Requested refill on Percocet.   Ok to refill??  Last office visit/ refill 10/17/2016.

## 2016-11-24 NOTE — Telephone Encounter (Signed)
Okay to refill? 

## 2016-11-27 ENCOUNTER — Other Ambulatory Visit: Payer: Self-pay | Admitting: Internal Medicine

## 2016-11-27 MED ORDER — OXYCODONE-ACETAMINOPHEN 7.5-325 MG PO TABS: 1.0000 | ORAL_TABLET | Freq: Three times a day (TID) | ORAL | 0 refills | Status: DC | PRN

## 2016-11-27 NOTE — Telephone Encounter (Signed)
Prescription printed and patient made aware to come to office to pick up on 11/28/2016.

## 2016-11-28 ENCOUNTER — Ambulatory Visit (INDEPENDENT_AMBULATORY_CARE_PROVIDER_SITE_OTHER): Payer: Medicare Other | Admitting: Urology

## 2016-11-28 DIAGNOSIS — R300 Dysuria: Secondary | ICD-10-CM | POA: Diagnosis not present

## 2016-11-28 DIAGNOSIS — C61 Malignant neoplasm of prostate: Secondary | ICD-10-CM | POA: Diagnosis not present

## 2016-11-28 DIAGNOSIS — N401 Enlarged prostate with lower urinary tract symptoms: Secondary | ICD-10-CM | POA: Diagnosis not present

## 2016-11-28 DIAGNOSIS — R310 Gross hematuria: Secondary | ICD-10-CM | POA: Diagnosis not present

## 2016-11-28 DIAGNOSIS — R351 Nocturia: Secondary | ICD-10-CM

## 2016-11-30 ENCOUNTER — Ambulatory Visit (HOSPITAL_COMMUNITY)
Admission: RE | Admit: 2016-11-30 | Discharge: 2016-11-30 | Disposition: A | Discharge status: Home / Self Care | Payer: Medicare Other | Source: Ambulatory Visit | Attending: Vascular Surgery | Admitting: Vascular Surgery

## 2016-11-30 ENCOUNTER — Ambulatory Visit (INDEPENDENT_AMBULATORY_CARE_PROVIDER_SITE_OTHER): Payer: Medicare Other | Admitting: Family

## 2016-11-30 ENCOUNTER — Encounter: Payer: Self-pay | Admitting: Family

## 2016-11-30 VITALS — BP 135/82 | HR 72 | Temp 97.6°F | Resp 20 | Ht 72.0 in | Wt 308.0 lb

## 2016-11-30 DIAGNOSIS — Z9889 Other specified postprocedural states: Secondary | ICD-10-CM

## 2016-11-30 DIAGNOSIS — Z87891 Personal history of nicotine dependence: Secondary | ICD-10-CM | POA: Diagnosis not present

## 2016-11-30 DIAGNOSIS — I6523 Occlusion and stenosis of bilateral carotid arteries: Secondary | ICD-10-CM | POA: Diagnosis not present

## 2016-11-30 DIAGNOSIS — I6522 Occlusion and stenosis of left carotid artery: Secondary | ICD-10-CM | POA: Diagnosis not present

## 2016-11-30 DIAGNOSIS — I6521 Occlusion and stenosis of right carotid artery: Secondary | ICD-10-CM | POA: Diagnosis not present

## 2016-11-30 LAB — VAS US CAROTID
LEFT ECA DIAS: -19 cm/s
Left CCA dist dias: -12 cm/s
Left CCA dist sys: -44 cm/s
Left CCA prox dias: 20 cm/s
Left CCA prox sys: 85 cm/s
Left ICA dist dias: -22 cm/s
Left ICA dist sys: -50 cm/s
Left ICA prox dias: -11 cm/s
Left ICA prox sys: -40 cm/s
RIGHT CCA MID DIAS: 14 cm/s
RIGHT ECA DIAS: -6 cm/s
RIGHT VERTEBRAL DIAS: 23 cm/s
Right CCA prox dias: 12 cm/s
Right CCA prox sys: 89 cm/s
Right cca dist sys: -70 cm/s

## 2016-11-30 NOTE — Patient Instructions (Signed)
Stroke Prevention Some medical conditions and behaviors are associated with an increased chance of having a stroke. You may prevent a stroke by making healthy choices and managing medical conditions. How can I reduce my risk of having a stroke?  Stay physically active. Get at least 30 minutes of activity on most or all days.  Do not smoke. It may also be helpful to avoid exposure to secondhand smoke.  Limit alcohol use. Moderate alcohol use is considered to be: ? No more than 2 drinks per day for men. ? No more than 1 drink per day for nonpregnant women.  Eat healthy foods. This involves: ? Eating 5 or more servings of fruits and vegetables a day. ? Making dietary changes that address high blood pressure (hypertension), high cholesterol, diabetes, or obesity.  Manage your cholesterol levels. ? Making food choices that are high in fiber and low in saturated fat, trans fat, and cholesterol may control cholesterol levels. ? Take any prescribed medicines to control cholesterol as directed by your health care provider.  Manage your diabetes. ? Controlling your carbohydrate and sugar intake is recommended to manage diabetes. ? Take any prescribed medicines to control diabetes as directed by your health care provider.  Control your hypertension. ? Making food choices that are low in salt (sodium), saturated fat, trans fat, and cholesterol is recommended to manage hypertension. ? Ask your health care provider if you need treatment to lower your blood pressure. Take any prescribed medicines to control hypertension as directed by your health care provider. ? If you are 18-39 years of age, have your blood pressure checked every 3-5 years. If you are 40 years of age or older, have your blood pressure checked every year.  Maintain a healthy weight. ? Reducing calorie intake and making food choices that are low in sodium, saturated fat, trans fat, and cholesterol are recommended to manage  weight.  Stop drug abuse.  Avoid taking birth control pills. ? Talk to your health care provider about the risks of taking birth control pills if you are over 35 years old, smoke, get migraines, or have ever had a blood clot.  Get evaluated for sleep disorders (sleep apnea). ? Talk to your health care provider about getting a sleep evaluation if you snore a lot or have excessive sleepiness.  Take medicines only as directed by your health care provider. ? For some people, aspirin or blood thinners (anticoagulants) are helpful in reducing the risk of forming abnormal blood clots that can lead to stroke. If you have the irregular heart rhythm of atrial fibrillation, you should be on a blood thinner unless there is a good reason you cannot take them. ? Understand all your medicine instructions.  Make sure that other conditions (such as anemia or atherosclerosis) are addressed. Get help right away if:  You have sudden weakness or numbness of the face, arm, or leg, especially on one side of the body.  Your face or eyelid droops to one side.  You have sudden confusion.  You have trouble speaking (aphasia) or understanding.  You have sudden trouble seeing in one or both eyes.  You have sudden trouble walking.  You have dizziness.  You have a loss of balance or coordination.  You have a sudden, severe headache with no known cause.  You have new chest pain or an irregular heartbeat. Any of these symptoms may represent a serious problem that is an emergency. Do not wait to see if the symptoms will go away.   Get medical help at once. Call your local emergency services (911 in [place] Do not drive yourself to the hospital. This information is not intended to replace advice given to you by your health care provider. Make sure you discuss any questions you have with your health care provider. Document Released: 06/29/2004 Document Revised: 10/28/2015 Document Reviewed: 11/22/2012 Elsevier  Interactive Patient Education  2017 Elsevier Inc.     Preventing Cerebrovascular Disease Arteries are blood vessels that carry blood that contains oxygen from the heart to all parts of the body. Cerebrovascular disease affects arteries that supply the brain. Any condition that blocks or disrupts blood flow to the brain can cause cerebrovascular disease. Brain cells that lose blood supply start to die within minutes (stroke). Stroke is the main danger of cerebrovascular disease. Atherosclerosis and high blood pressure are common causes of cerebrovascular disease. Atherosclerosis is narrowing and hardening of an artery that results when fat, cholesterol, calcium, or other substances (plaque) build up inside an artery. Plaque reduces blood flow through the artery. High blood pressure increases the risk of bleeding inside the brain. Making diet and lifestyle changes to prevent atherosclerosis and high blood pressure lowers your risk of cerebrovascular disease. What nutrition changes can be made?  Eat more fruits, vegetables, and whole grains.  Reduce how much saturated fat you eat. To do this, eat less red meat and fewer full-fat dairy products.  Eat healthy proteins instead of red meat. Healthy proteins include: ? Fish. Eat fish that contains heart-healthy omega-3 fatty acids, twice a week. Examples include salmon, albacore tuna, mackerel, and herring. ? Chicken. ? Nuts. ? Low-fat or nonfat yogurt.  Avoid processed meats, like bacon and lunchmeat.  Avoid foods that contain: ? A lot of sugar, such as sweets and drinks with added sugar. ? A lot of salt (sodium). Avoid adding extra salt to your food, as told by your health care provider. ? Trans fats, such as margarine and baked goods. Trans fats may be listed as "partially hydrogenated oils" on food labels.  Check food labels to see how much sodium, sugar, and trans fats are in foods.  Use vegetable oils that contain low amounts of  saturated fat, such as olive oil or canola oil. What lifestyle changes can be made?  Drink alcohol in moderation. This means no more than 1 drink a day for nonpregnant women and 2 drinks a day for men. One drink equals 12 oz of beer, 5 oz of wine, or 1 oz of hard liquor.  If you are overweight, ask your health care provider to recommend a weight-loss plan for you. Losing 5-10 lb (2.2-4.5 kg) can reduce your risk of diabetes, atherosclerosis, and high blood pressure.  Exercise for 30?60 minutes on most days, or as much as told by your health care provider. ? Do moderate-intensity exercise, such as brisk walking, bicycling, and water aerobics. Ask your health care provider which activities are safe for you.  Do not use any products that contain nicotine or tobacco, such as cigarettes and e-cigarettes. If you need help quitting, ask your health care provider. Why are these changes important? Making these changes lowers your risk of many diseases that can cause cerebrovascular disease and stroke. Stroke is a leading cause of death and disability. Making these changes also improves your overall health and quality of life. What can I do to lower my risk? The following factors make you more likely to develop cerebrovascular disease:  Being overweight.  Smoking.  Being physically inactive.    Eating a high-fat diet.  Having certain health conditions, such as: ? Diabetes. ? High blood pressure. ? Heart disease. ? Atherosclerosis. ? High cholesterol. ? Sickle cell disease.  Talk with your health care provider about your risk for cerebrovascular disease. Work with your health care provider to control diseases that you have that may contribute to cerebrovascular disease. Your health care provider may prescribe medicines to help prevent major causes of cerebrovascular disease. Where to find more information: Learn more about preventing cerebrovascular disease from:  National Heart, Lung, and  Blood Institute: www.nhlbi.nih.gov/health/health-topics/topics/stroke  Centers for Disease Control and Prevention: cdc.gov/stroke/about.htm  Summary  Cerebrovascular disease can lead to a stroke.  Atherosclerosis and high blood pressure are major causes of cerebrovascular disease.  Making diet and lifestyle changes can reduce your risk of cerebrovascular disease.  Work with your health care provider to get your risk factors under control to reduce your risk of cerebrovascular disease. This information is not intended to replace advice given to you by your health care provider. Make sure you discuss any questions you have with your health care provider. Document Released: 06/06/2015 Document Revised: 12/10/2015 Document Reviewed: 06/06/2015 Elsevier Interactive Patient Education  2018 Elsevier Inc.  

## 2016-11-30 NOTE — Progress Notes (Signed)
Chief Complaint: Follow up Extracranial Carotid Artery Stenosis   History of Present Illness  Peter Dunlap is a 61 y.o. male patient of Dr. Donnetta Hutching seen for annual carotid duplex status post right CEA in February 2010.   He denies any history of TIA or stroke symptom. Specifically he denies a history of amaurosis fugax or monocular blindness, unilateral facial drooping, hemiplegia/hemiparesis, or receptive or expressive aphasia.  He denies numbness, tingling, cold sensation, or aching in either upper extremity.   He denies claudication symptoms with walking, denies non healing wounds. He reports "bulging discs" in his low back, has occasional numbness at anterior thigh. States he has severe OA and needs a right knee replacement, states gout issues in left foot intermittently.  Has rotator cuff issues in both shoulders.   He states that his heart rhythm remains irregular and that his cardiologist, Dr. Crissie Sickles, is aware. He had something similar to an IVC filter placed early in 2017 or late 2016, placed by Dr. Rayann Heman, per pt. Pt states he has had 2-3 MI's.  He has had 2 MI's, last one approximately 2009.  Had thyroid ablation March 2016 for hyperthyroidism, takes levothyroxine.   He was hospitalized at [facility] end of 2014 for CHF, states he has been stabilized lately.  He was walking more and intentionally losing weight, but has stopped this due to his back hurting him more recently.    He finished radiation tx for prostate cancer in January 2018.   Pt Diabetic: No, A1C was 5.5 on 08-14-16 (review of records), has 2 sisters with DM, he is taking metformin Pt smoker: former smoker, quit in 2014  Pt meds include: Statin : Yes ASA: Yes Other anticoagulants/antiplatelets: No. He was on coumadin for atrial fib and this was stopped after he had an intracranial bleed in 2015.    Past Medical History:  Diagnosis Date  . Arteriosclerotic cardiovascular disease (ASCVD)  2005   catheterization in 10/2010:50% mid LAD, diffuse distal disease, circumflex irregularities, large dominant RCA with a 50% ostial, 70% distal, 60% posterolateral and 70% PDA; normal EF  . Arthritis   . Benign prostatic hypertrophy   . Cerebrovascular disease 2010   R. carotid endarterectomy; Duplex in 10/2010-widely patent ICAs, subtotal left vertebral-not thought to be contributing to symptoms  . Cervical spine disease    CT in 2012-advanced degeneration and spondylosis with moderate spinal stenosis at C3-C6  . Depression   . Erectile dysfunction   . Gastroesophageal reflux disease   . H/O hiatal hernia   . H/O: substance abuse    Cocaine, marijuana, alcohol.  Quit 2013.   [name] Hyperlipidemia   . Hypertension   . Non-ST elevation myocardial infarction (NSTEMI), initial episode of care [facility]) 12/02/2013   DES LAD  . Obesity   . Prostate cancer ([place])   . Sleep apnea    CPAP  . Tachy-brady syndrome ([place])    a. s/p STJ dual chamber PPM   . Thyroid disease   . Tobacco abuse    Quit 2014    Social History Social History  Substance Use Topics  . Smoking status: Former Smoker    Packs/day: 1.00    Years: 40.00    Types: Cigarettes    Start date: 10/20/1972    Quit date: 10/10/2012  . Smokeless tobacco: Never Used     Comment: Quit in May.   . Alcohol use No     Comment: former drinker-- sober since 2013.  Family History Family History  Problem Relation Age of Onset  . Hypertension Mother        Cerebrovascular disease  . Diabetes Mother   . Coronary artery disease Father 58  . Diabetes type II Father   . Hypertension Father   . Heart attack Father   . Diabetes Brother   . Hypertension Brother   . Lung cancer Paternal Uncle   . Diabetes Sister   . Hypertension Sister   . Heart attack Sister 25  . Cancer Sister        leukemia  . Cancer Maternal Uncle        breast  . Cancer Maternal Grandmother        breast    Surgical History Past Surgical History:   Procedure Laterality Date  . BRAIN SURGERY  2015   hematoma evacuation  . BURR HOLE Right 04/13/2014   Procedure: Haskell Flirt;  Surgeon: Charlie Pitter, MD;  Location: [place] [facility]URO ORS;  Service: Neurosurgery;  Laterality: Right;  . CAROTID ENDARTERECTOMY Right Feb. 25, 2010    CEA  . CORONARY ANGIOPLASTY WITH STENT PLACEMENT  12/03/2013   LAD 90%-->0% W/ Promus Premier DES 3.0 mm x 16 mm, CFX OK, RCA 40%, EF 70-75%  . LEFT ATRIAL APPENDAGE OCCLUSION N/A 08/05/2015   Procedure: LEFT ATRIAL APPENDAGE OCCLUSION;  Surgeon: [name], MD;  Location: [place] CV LAB;  Service: Cardiovascular;  Laterality: N/A;  . LEFT HEART CATHETERIZATION WITH CORONARY ANGIOGRAM Left 12/03/2013   Procedure: LEFT HEART CATHETERIZATION WITH CORONARY ANGIOGRAM;  Surgeon: Leonie Man, MD;  Location: [facility] CATH LAB;  Service: Cardiovascular;  Laterality: Left;  . LEFT HEART CATHETERIZATION WITH CORONARY ANGIOGRAM N/A 01/26/2014   Procedure: LEFT HEART CATHETERIZATION WITH CORONARY ANGIOGRAM;  Surgeon: Jettie Booze, MD;  Location: [facility] CATH LAB;  Service: Cardiovascular;  Laterality: N/A;  . LEFT HEART CATHETERIZATION WITH CORONARY ANGIOGRAM N/A 08/03/2014   Procedure: LEFT HEART CATHETERIZATION WITH CORONARY ANGIOGRAM;  Surgeon: Burnell Blanks, MD;  Location: [facility] CATH LAB;  Service: Cardiovascular;  Laterality: N/A;  . PERCUTANEOUS CORONARY STENT INTERVENTION (PCI-S)  12/03/2013   Procedure: PERCUTANEOUS CORONARY STENT INTERVENTION (PCI-S);  Surgeon: Leonie Man, MD;  Location: [facility] CATH LAB;  Service: Cardiovascular;;  . PERMANENT PACEMAKER INSERTION N/A 09/18/2014   Procedure: PERMANENT PACEMAKER INSERTION;  Surgeon: [name], MD;  Location: [facility] CATH LAB;  Service: Cardiovascular;  Laterality: N/A;  . RADIOFREQUENCY ABLATION  2005   for PSVT  . TEE WITHOUT CARDIOVERSION N/A 07/27/2015   Procedure: TRANSESOPHAGEAL ECHOCARDIOGRAM (TEE);  Surgeon: Lelon Perla, MD;  Location: [facility] ENDOSCOPY;  Service:  Cardiovascular;  Laterality: N/A;  . TEE WITHOUT CARDIOVERSION N/A 09/15/2015   Procedure: TRANSESOPHAGEAL ECHOCARDIOGRAM (TEE);  Surgeon: Thayer Headings, MD;  Location: [place];  Service: Cardiovascular;  Laterality: N/A;    Allergies  Allergen Reactions  . Lactose Intolerance (Gi) Other (See Comments)    UPSET STOMACH     Current Outpatient Prescriptions  Medication Sig Dispense Refill  . allopurinol (ZYLOPRIM) 100 MG tablet TAKE 1 TABLET BY MOUTH EVERY DAY 90 tablet 6  . aspirin 81 MG chewable tablet Chew 81 mg by mouth daily.     . colchicine 0.6 MG tablet Take 1 tablet (0.6 mg total) by mouth daily. 15 tablet 0  . DEXILANT 60 MG capsule TAKE 1 CAPSULE BY MOUTH EVERY DAY 30 capsule 0  . furosemide (LASIX) 20 MG tablet TAKE 3 TABLETS BY MOUTH DAILY 90 tablet 9  .  isosorbide mononitrate (IMDUR) 60 MG 24 hr tablet TAKE 1 TABLET BY MOUTH EVERY DAY 90 tablet 2  . isosorbide mononitrate (IMDUR) 60 MG 24 hr tablet Take 1 tablet (60 mg total) by mouth daily. 90 tablet 1  . levothyroxine (SYNTHROID, LEVOTHROID) 200 MCG tablet Take 1 tablet (200 mcg total) by mouth daily before breakfast. 30 tablet 6  . MAGNESIUM-OXIDE 400 (241.3 Mg) MG tablet TAKE 1 TABLET BY MOUTH TWICE DAILY 60 tablet 6  . metFORMIN (GLUCOPHAGE) 500 MG tablet TAKE 1 TABLET(500 MG) BY MOUTH TWICE DAILY WITH A MEAL (Patient taking differently: TAKE 1 TABLET(500 MG) BY MOUTH ONCE DAILY WITH A MEAL) 60 tablet 2  . nitroGLYCERIN (NITROSTAT) 0.4 MG SL tablet PLACE 1 TABLET UNDER THE TONGUE EVERY 5 MINUTES AS NEEDED FOR CHEST PAIN. CALL 911 AT THIRD DOSE IN 15 MINUTES 25 tablet 1  . ONE TOUCH ULTRA TEST test strip USE TO TEST ONCE DAILY 50 each 3  . oxyCODONE-acetaminophen (PERCOCET) 7.5-325 MG tablet Take 1 tablet by mouth every 8 (eight) hours as needed for severe pain. 60 tablet 0  . potassium chloride SA (K-DUR,KLOR-CON) 20 MEQ tablet TAKE 1 TABLET BY MOUTH EVERY DAY 30 tablet 11  . RAPAFLO 8 MG CAPS capsule TAKE 1  CAPSULE(8 MG) BY MOUTH DAILY 30 capsule 0  . simvastatin (ZOCOR) 40 MG tablet TAKE 1 TABLET(40 MG) BY MOUTH DAILY 30 tablet 2  . sotalol (BETAPACE) 160 MG tablet TAKE 1 TABLET(160 MG) BY MOUTH TWICE DAILY 60 tablet 6  . traZODone (DESYREL) 50 MG tablet TAKE 1/2 TO 1 TABLET BY MOUTH AT BEDTIME AS NEEDED FOR SLEEP 30 tablet 3   No current facility-administered medications for this visit.    Facility-Administered Medications Ordered in Other Visits  Medication Dose Route Frequency Provider Last Rate Last Dose  . 0.9 %  sodium chloride infusion   Intravenous Continuous [name], MD        Review of Systems : See HPI for pertinent positives and negatives.  Physical Examination  Vitals:   11/30/16 0938 11/30/16 0941  BP: 100/73 135/82  Pulse: 72   Resp: 20   Temp: 97.6 F (36.4 C)   TempSrc: Oral   SpO2: 99%   Weight: (!) 308 lb (139.7 kg)   Height: 6' (1.829 m)    Body mass index is 41.77 kg/m.  General: WDWN morbidly obese male in NAD GAIT: normal Eyes: PERRLA Pulmonary: Respirations are non-labored, CTAB Cardiac: Regular rhythm. Pacemaker palpated left upper chest.  VASCULAR EXAM Carotid Bruits Left Right   Negative Negative     DORSALIS PEDIS 2+ palpable  2+ palpable   Aorta is not palpable. Radial pulses are 2+ palpable and equal.      Gastrointestinal: soft, nontender, BS WNL, no r/g, no palpable masses.  Musculoskeletal: No muscle atrophy/wasting. M/S 5/5 throughout except 4/5 in right LE, Extremities without ischemic changes.  Neurologic: A&O X 3; Appropriate Affect,  Speech is normal CN 2-12 intact, Pain and light touch intact in extremities, Motor exam as listed above.     Assessment: GEOFFRY BANNISTER is a 61 y.o. male who is status post right CEA in February 2010.  He has no history of stroke or  MI. Left arm systolic blood pressure is 35 mm Hg lower than the right arm. He does have a significant cardiac history.  DATA Carotid Duplex (11/30/16): Rght carotid endarterectomy site with no evidence of hyperplasia or restenosis and  left internal carotid artery velocities suggest  a <40% stenosis. Right vertebral artery flow is antegrade, left is not visualized.   No significant change in comparison to the last exam on 11-25-15.    Plan: Follow-up in 1 year with Carotid Duplex scan.    I discussed in depth with the patient the nature of atherosclerosis, and emphasized the importance of maximal medical management including strict control of blood pressure, blood glucose, and lipid levels, obtaining regular exercise, and continued cessation of smoking.  The patient is aware that without maximal medical management the underlying atherosclerotic disease process will progress, limiting the benefit of any interventions. The patient was given information about stroke prevention and what symptoms should prompt the patient to seek immediate medical care. Thank you for allowing [place] to participate in this patient's care.  Clemon Chambers, RN, MSN, FNP-C [facility] Office: [phone]  Clinic Physician: Oneida Alar  11/30/16 9:44 AM

## 2016-12-08 ENCOUNTER — Other Ambulatory Visit: Payer: Self-pay | Admitting: Family Medicine

## 2016-12-13 ENCOUNTER — Other Ambulatory Visit: Payer: Self-pay | Admitting: *Deleted

## 2016-12-13 MED ORDER — SIMVASTATIN 40 MG PO TABS: ORAL_TABLET | ORAL | 3 refills | Status: DC

## 2016-12-16 ENCOUNTER — Other Ambulatory Visit: Payer: Self-pay | Admitting: Family Medicine

## 2016-12-26 ENCOUNTER — Telehealth: Payer: Self-pay | Admitting: *Deleted

## 2016-12-26 MED ORDER — OXYCODONE-ACETAMINOPHEN 7.5-325 MG PO TABS: 1.0000 | ORAL_TABLET | Freq: Three times a day (TID) | ORAL | 0 refills | Status: DC | PRN

## 2016-12-26 NOTE — Telephone Encounter (Signed)
Received call from patient.   Requested refill on Oxycodone.   Ok to refill??  Last office visit 10/17/2016.  Last refill 11/27/2016.

## 2016-12-26 NOTE — Telephone Encounter (Signed)
okay

## 2016-12-26 NOTE — Telephone Encounter (Signed)
Prescription printed and patient made aware to come to office to pick up on 12/27/2016. 

## 2017-01-17 ENCOUNTER — Other Ambulatory Visit: Payer: Self-pay | Admitting: Family Medicine

## 2017-01-29 ENCOUNTER — Telehealth: Payer: Self-pay | Admitting: Internal Medicine

## 2017-01-29 ENCOUNTER — Telehealth: Payer: Self-pay | Admitting: *Deleted

## 2017-01-29 DIAGNOSIS — H02824 Cysts of left upper eyelid: Secondary | ICD-10-CM | POA: Diagnosis not present

## 2017-01-29 DIAGNOSIS — G4733 Obstructive sleep apnea (adult) (pediatric): Secondary | ICD-10-CM | POA: Diagnosis not present

## 2017-01-29 DIAGNOSIS — H02821 Cysts of right upper eyelid: Secondary | ICD-10-CM | POA: Diagnosis not present

## 2017-01-29 NOTE — Telephone Encounter (Signed)
New message        [facility] Pre-operative Risk Assessment    Request for surgical clearance:  1. What type of surgery is being performed? Cyst removed from both eyes   2. When is this surgery scheduled?  9/6  3. Are there any medications that need to be held prior to surgery and how long? aspirin  4. Name of physician performing surgery? Dr Rutherford Guys   5. What is your office phone and fax number?  [phone] fax   Peter Dunlap 01/29/2017, 4:45 PM  _________________________________________________________________   (provider comments below)

## 2017-01-29 NOTE — Telephone Encounter (Signed)
Received call from patient.   Requested refill on Oxycodone/APAP.   Ok to refill??  Last office visit 10/17/2016.  Last refill 12/26/2016.

## 2017-01-30 MED ORDER — OXYCODONE-ACETAMINOPHEN 7.5-325 MG PO TABS: 1.0000 | ORAL_TABLET | Freq: Three times a day (TID) | ORAL | 0 refills | Status: DC | PRN

## 2017-01-30 NOTE — Telephone Encounter (Signed)
Okay to refill? 

## 2017-01-30 NOTE — Telephone Encounter (Signed)
Prescription printed and patient made aware to come to office to pick up after 2pm on 01/30/2017 per VM.

## 2017-01-31 ENCOUNTER — Encounter: Payer: Self-pay | Admitting: Family Medicine

## 2017-01-31 ENCOUNTER — Ambulatory Visit (INDEPENDENT_AMBULATORY_CARE_PROVIDER_SITE_OTHER): Payer: Medicare Other | Admitting: Family Medicine

## 2017-01-31 VITALS — BP 118/64 | HR 88 | Temp 98.1°F | Resp 16 | Ht 72.0 in | Wt 316.0 lb

## 2017-01-31 DIAGNOSIS — R252 Cramp and spasm: Secondary | ICD-10-CM

## 2017-01-31 NOTE — Patient Instructions (Signed)
Get up and walk around if needed Keep hydrated with plain water  We will call with lab results  F/U as previous

## 2017-01-31 NOTE — Telephone Encounter (Signed)
Surgical clearance faxed to [facility] eye care.  Per Dr. Lovena Le, Dr. Gershon Crane may hold Pt ASA as long as needed for surgery.  Pt low risk.

## 2017-01-31 NOTE — Progress Notes (Signed)
   Subjective:    Patient ID: Peter Dunlap, male    DOB: Oct 09, 1955, 61 y.o.   MRN: [id number]  Patient presents for B Leg Pain (x1 month- states that he has muscle spasms to B legs when he is lying down- reports that it feels like his legs are contracting)   Feels muscle crampings, mostly at night, for past month. No falls recently  If he gets up  and starts wallking then it stops  He has chronic back painBut this has been unchanged. He denies any pain radiating from his back He has been walking more than usual states he's been going out fishing. He admits to not drinking a lot of water but does drink Gatorade  Taking potassium and magnesium    Review Of Systems:  GEN- denies fatigue, fever, weight loss,weakness, recent illness HEENT- denies eye drainage, change in vision, nasal discharge, CVS- denies chest pain, palpitations RESP- denies SOB, cough, wheeze ABD- denies N/V, change in stools, abd pain GU- denies dysuria, hematuria, dribbling, incontinence MSK- denies joint pain,+ muscle aches, injury Neuro- denies headache, dizziness, syncope, seizure activity       Objective:    BP 118/64   Pulse 88   Temp 98.1 F (36.7 C) (Oral)   Resp 16   Ht 6' (1.829 m)   Wt (!) 316 lb (143.3 kg)   SpO2 97%   BMI 42.86 kg/m  GEN- NAD, alert and oriented x3 CVS- RRR, no murmur RESP-CTAB Ext- no edema,. Has vein running on right calf, mild TTP, no erythema, neg homans  Pulse- radial  2+ DP 2+       Assessment & Plan:      Problem List Items Addressed This Visit    None    Visit Diagnoses    Leg cramps    -  Primary   Leg cramps,first check his mg and potassium, if normal, discussed hydration status. Other DD is RLS with his nigth symptoms but with his other meds prefer to hold off on adding anything else at this time    Relevant Orders   Basic metabolic panel   Magnesium      Note: This dictation was prepared with Dragon dictation along with smaller phrase  technology. Any transcriptional errors that result from this process are unintentional.

## 2017-02-01 LAB — BASIC METABOLIC PANEL
BUN: 10 mg/dL (ref 7–25)
CO2: 22 mmol/L (ref 20–32)
Calcium: 9 mg/dL (ref 8.6–10.3)
Chloride: 105 mmol/L (ref 98–110)
Creat: 1.2 mg/dL (ref 0.70–1.25)
Glucose, Bld: 115 mg/dL — ABNORMAL HIGH (ref 70–99)
Potassium: 4.5 mmol/L (ref 3.5–5.3)
Sodium: 141 mmol/L (ref 135–146)

## 2017-02-01 LAB — MAGNESIUM: Magnesium: 2 mg/dL (ref 1.5–2.5)

## 2017-02-07 ENCOUNTER — Other Ambulatory Visit: Payer: Self-pay | Admitting: Family Medicine

## 2017-02-08 DIAGNOSIS — H02824 Cysts of left upper eyelid: Secondary | ICD-10-CM | POA: Diagnosis not present

## 2017-02-08 DIAGNOSIS — H02821 Cysts of right upper eyelid: Secondary | ICD-10-CM | POA: Diagnosis not present

## 2017-02-08 NOTE — Telephone Encounter (Signed)
Refill appropriate 

## 2017-02-13 ENCOUNTER — Ambulatory Visit (INDEPENDENT_AMBULATORY_CARE_PROVIDER_SITE_OTHER): Payer: Medicare Other | Admitting: *Deleted

## 2017-02-13 DIAGNOSIS — I495 Sick sinus syndrome: Secondary | ICD-10-CM | POA: Diagnosis not present

## 2017-02-13 NOTE — Progress Notes (Signed)
Remote pacemaker transmission.   

## 2017-02-14 ENCOUNTER — Other Ambulatory Visit: Payer: Self-pay | Admitting: Nurse Practitioner

## 2017-02-14 ENCOUNTER — Encounter: Payer: Self-pay | Admitting: Cardiology

## 2017-02-14 ENCOUNTER — Other Ambulatory Visit: Payer: Self-pay | Admitting: Family Medicine

## 2017-02-15 LAB — CUP PACEART REMOTE DEVICE CHECK
Date Time Interrogation Session: 20180913142436
Implantable Lead Implant Date: 20160415
Implantable Lead Implant Date: 20160415
Implantable Lead Location: 753859
Implantable Lead Location: 753860
Implantable Pulse Generator Implant Date: 20160415
Lead Channel Setting Pacing Amplitude: 2 V
Lead Channel Setting Pacing Amplitude: 2.5 V
Lead Channel Setting Pacing Pulse Width: 0.5 ms
Lead Channel Setting Sensing Sensitivity: 2 mV
Pulse Gen Model: 2240
Pulse Gen Serial Number: 7756161

## 2017-02-21 ENCOUNTER — Encounter: Payer: Self-pay | Admitting: "Endocrinology

## 2017-02-21 ENCOUNTER — Ambulatory Visit (INDEPENDENT_AMBULATORY_CARE_PROVIDER_SITE_OTHER): Payer: Medicare Other | Admitting: "Endocrinology

## 2017-02-21 VITALS — BP 141/83 | HR 61 | Ht 72.0 in | Wt 313.0 lb

## 2017-02-21 DIAGNOSIS — E669 Obesity, unspecified: Secondary | ICD-10-CM | POA: Diagnosis not present

## 2017-02-21 DIAGNOSIS — E890 Postprocedural hypothyroidism: Secondary | ICD-10-CM

## 2017-02-21 DIAGNOSIS — E1169 Type 2 diabetes mellitus with other specified complication: Secondary | ICD-10-CM | POA: Diagnosis not present

## 2017-02-21 MED ORDER — METFORMIN HCL 500 MG PO TABS: 500.0000 mg | ORAL_TABLET | Freq: Every day | ORAL | 6 refills | Status: DC

## 2017-02-21 MED ORDER — LEVOTHYROXINE SODIUM 200 MCG PO TABS: 200.0000 ug | ORAL_TABLET | Freq: Every day | ORAL | 6 refills | Status: DC

## 2017-02-21 NOTE — Progress Notes (Signed)
Subjective:    Patient ID: Peter Dunlap, male    DOB: [birth year], PCP [name], MD   Past Medical History:  Diagnosis Date  . Arteriosclerotic cardiovascular disease (ASCVD) 2005   catheterization in 10/2010:50% mid LAD, diffuse distal disease, circumflex irregularities, large dominant RCA with a 50% ostial, 70% distal, 60% posterolateral and 70% PDA; normal EF  . Arthritis   . Benign prostatic hypertrophy   . Cerebrovascular disease 2010   R. carotid endarterectomy; Duplex in 10/2010-widely patent ICAs, subtotal left vertebral-not thought to be contributing to symptoms  . Cervical spine disease    CT in 2012-advanced degeneration and spondylosis with moderate spinal stenosis at C3-C6  . Depression   . Erectile dysfunction   . Gastroesophageal reflux disease   . H/O hiatal hernia   . H/O: substance abuse    Cocaine, marijuana, alcohol.  Quit 2013.   [name] Hyperlipidemia   . Hypertension   . Non-ST elevation myocardial infarction (NSTEMI), initial episode of care [facility]) 12/02/2013   DES LAD  . Obesity   . Prostate cancer ([place])   . Sleep apnea    CPAP  . Tachy-brady syndrome ([place])    a. s/p STJ dual chamber PPM   . Thyroid disease   . Tobacco abuse    Quit 2014   Past Surgical History:  Procedure Laterality Date  . BRAIN SURGERY  2015   hematoma evacuation  . BURR HOLE Right 04/13/2014   Procedure: Haskell Flirt;  Surgeon: Charlie Pitter, MD;  Location: [place] NEURO ORS;  Service: Neurosurgery;  Laterality: Right;  . CAROTID ENDARTERECTOMY Right Feb. 25, 2010    CEA  . CORONARY ANGIOPLASTY WITH STENT PLACEMENT  12/03/2013   LAD 90%-->0% W/ Promus Premier DES 3.0 mm x 16 mm, CFX OK, RCA 40%, EF 70-75%  . LEFT ATRIAL APPENDAGE OCCLUSION N/A 08/05/2015   Procedure: LEFT ATRIAL APPENDAGE OCCLUSION;  Surgeon: [name], MD;  Location: [place] CV LAB;  Service: Cardiovascular;  Laterality: N/A;  . LEFT HEART CATHETERIZATION WITH CORONARY ANGIOGRAM Left 12/03/2013    Procedure: LEFT HEART CATHETERIZATION WITH CORONARY ANGIOGRAM;  Surgeon: Leonie Man, MD;  Location[facility] C[facility] LAB;  Service: Cardiovascular;  Laterality: Left;  . LEFT HEART CATHETERIZATION WITH CORONARY ANGIOGRAM N/A 01/26/2014   Procedure: LEFT HEART CATHETERIZATION WITH CORONARY ANGIOGRAM;  Surgeon: Jettie Booze, MD;  Location: [facility] CATH LAB;  Service: Cardiovascular;  Laterality: N/A;  . LEFT HEART CATHETERIZATION WITH CORONARY ANGIOGRAM N/A 08/03/2014   Procedure: LEFT HEART CATHETERIZATION WITH CORONARY ANGIOGRAM;  Surgeon: Burnell Blanks, MD;  Location: [facility] CATH LAB;  Service: Cardiovascular;  Laterality: N/A;  . PERCUTANEOUS CORONARY STENT INTERVENTION (PCI-S)  12/03/2013   Procedure: PERCUTANEOUS CORONARY STENT INTERVENTION (PCI-S);  Surgeon: Leonie Man, MD;  Location: [facility] CATH LAB;  Service: Cardiovascular;;  . PERMANENT PACEMAKER INSERTION N/A 09/18/2014   Procedure: PERMANENT PACEMAKER INSERTION;  Surgeon: [name], MD;  Location: [facility] CATH LAB;  Service: Cardiovascular;  Laterality: N/A;  . RADIOFREQUENCY ABLATION  2005   for PSVT  . TEE WITHOUT CARDIOVERSION N/A 07/27/2015   Procedure: TRANSESOPHAGEAL ECHOCARDIOGRAM (TEE);  Surgeon: Lelon Perla, MD;  Location: [facility] ENDOSCOPY;  Service: Cardiovascular;  Laterality: N/A;  . TEE WITHOUT CARDIOVERSION N/A 09/15/2015   Procedure: TRANSESOPHAGEAL ECHOCARDIOGRAM (TEE);  Surgeon: Thayer Headings, MD;  Location: [facility] ENDOSCOPY;  Service: Cardiovascular;  Laterality: N/A;   Social History   Social History  . Marital status: Married  Spouse name: N/A  . Number of children: 0  . Years of education: N/A   Occupational History  . Retired    Social History Main Topics  . Smoking status: Former Smoker    Packs/day: 1.00    Years: 40.00    Types: Cigarettes    Start date: 10/20/1972    Quit date: 10/10/2012  . Smokeless tobacco: Never Used     Comment: Quit in May.   . Alcohol use No     Comment: former drinker--  sober since 2013.   . Drug use: No     Comment: quit cocaine 10/2011  . Sexual activity: Yes    Partners: Female   Other Topics Concern  . None   Social History Narrative   Lives in [place].   Outpatient Encounter Prescriptions as of 02/21/2017  Medication Sig  . allopurinol (ZYLOPRIM) 100 MG tablet TAKE 1 TABLET BY MOUTH EVERY DAY  . aspirin 81 MG chewable tablet Chew 81 mg by mouth daily.   . colchicine 0.6 MG tablet Take 1 tablet (0.6 mg total) by mouth daily.  [name] DEXILANT 60 MG capsule TAKE 1 CAPSULE BY MOUTH EVERY DAY  . furosemide (LASIX) 20 MG tablet TAKE 3 TABLETS BY MOUTH DAILY  . isosorbide mononitrate (IMDUR) 60 MG 24 hr tablet TAKE 1 TABLET BY MOUTH EVERY DAY  . levothyroxine (SYNTHROID, LEVOTHROID) 200 MCG tablet Take 1 tablet (200 mcg total) by mouth daily before breakfast.  . magnesium oxide (MAG-OX) 400 (241.3 Mg) MG tablet TAKE 1 TABLET BY MOUTH TWICE DAILY  . metFORMIN (GLUCOPHAGE) 500 MG tablet Take 1 tablet (500 mg total) by mouth daily with breakfast.  . nitroGLYCERIN (NITROSTAT) 0.4 MG SL tablet PLACE 1 TABLET UNDER THE TONGUE EVERY 5 MINUTES AS NEEDED FOR CHEST PAIN. CALL 911 AT THIRD DOSE IN 15 MINUTES  . ONE TOUCH ULTRA TEST test strip USE TO TEST ONCE DAILY  . oxyCODONE-acetaminophen (PERCOCET) 7.5-325 MG tablet Take 1 tablet by mouth every 8 (eight) hours as needed for severe pain.  . potassium chloride SA (K-DUR,KLOR-CON) 20 MEQ tablet TAKE 1 TABLET BY MOUTH EVERY DAY  . RAPAFLO 8 MG CAPS capsule TAKE 1 CAPSULE(8 MG) BY MOUTH DAILY  . simvastatin (ZOCOR) 40 MG tablet TAKE 1 TABLET(40 MG) BY MOUTH DAILY  . sotalol (BETAPACE) 160 MG tablet TAKE 1 TABLET(160 MG) BY MOUTH TWICE DAILY  . traZODone (DESYREL) 50 MG tablet TAKE 1/2 TO 1 TABLET BY MOUTH AT BEDTIME AS NEEDED FOR SLEEP  . [DISCONTINUED] DEXILANT 60 MG capsule TAKE 1 CAPSULE BY MOUTH EVERY DAY  . [DISCONTINUED] levothyroxine (SYNTHROID, LEVOTHROID) 200 MCG tablet Take 1 tablet (200 mcg total) by mouth  daily before breakfast.  . [DISCONTINUED] metFORMIN (GLUCOPHAGE) 500 MG tablet TAKE 1 TABLET(500 MG) BY MOUTH TWICE DAILY WITH A MEAL (Patient taking differently: TAKE 1 TABLET(500 MG) BY MOUTH ONCE DAILY WITH A MEAL)  . [DISCONTINUED] 0.9 %  sodium chloride infusion    No facility-administered encounter medications on file as of 02/21/2017.    ALLERGIES: Allergies  Allergen Reactions  . Lactose Intolerance (Gi) Other (See Comments)    UPSET STOMACH    VACCINATION STATUS: Immunization History  Administered Date(s) Administered  . Influenza,inj,Quad PF,6+ Mos 02/17/2014, 03/09/2015, 03/10/2016  . Pneumococcal Polysaccharide-23 01/05/2014  . Tdap 12/19/2010    HPI  Mr. Schoch is a 61 - yr-old male patient with medical history as above.  He was given RAI therapy on March 16, 2014  . He is  now more consistent  taking his levothyroxine. He is on levothyroxine 200 g by mouth every morning.  He feels better. -He denies cold intolerance, constipation, fatigue. - He also has controlled type 2 diabetes on metformin. His A1c was 5.5% during his last visit. He is gaining weight.  Pt denies family history of thyroid dysfunction . He denies personal history of goiter. he has hx of a-fib, and CAD which required stent placement x 2.   Review of Systems  Constitutional:  + Weight gain,  no subjective hyperthermia/hypothermia. Eyes: no blurry vision, no xerophthalmia ENT: no sore throat, no nodules palpated in throat, no dysphagia/odynophagia, no hoarseness Cardiovascular: no CP/SOB/palpitations/leg swelling Respiratory: no cough/SOB Gastrointestinal: no N/V/D/C Musculoskeletal:  complains of right knee pain and left foot pain from gout.  Skin: no rashes Neurological: no tremors/numbness/tingling/dizziness Psychiatric: no depression/anxiety  Objective:    BP (!) 141/83   Pulse 61   Ht 6' (1.829 m)   Wt (!) 313 lb (142 kg)   BMI 42.45 kg/m   Wt Readings from Last 3 Encounters:   02/21/17 (!) 313 lb (142 kg)  01/31/17 (!) 316 lb (143.3 kg)  11/30/16 (!) 308 lb (139.7 kg)    Physical Exam Constitutional:    obese, in NAD Eyes: PERRLA, EOMI, no exophthalmos ENT: moist mucous membranes, no thyromegaly, no cervical lymphadenopathy Cardiovascular: RRR, No MRG Respiratory: CTA B Gastrointestinal: abdomen soft, NT, ND, BS+ Musculoskeletal:  strength intact in all 4 Skin: moist, warm, no rashes Neurological: no tremor with outstretched hands, DTR normal in all 4   CMP     Component Value Date/Time   NA 141 01/31/2017 0921   NA 137 04/17/2016 1153   K 4.5 01/31/2017 0921   K 4.4 04/17/2016 1153   CL 105 01/31/2017 0921   CO2 22 01/31/2017 0921   CO2 22 04/17/2016 1153   GLUCOSE 115 (H) 01/31/2017 0921   GLUCOSE 115 04/17/2016 1153   BUN 10 01/31/2017 0921   BUN 13.6 04/17/2016 1153   CREATININE 1.20 01/31/2017 0921   CREATININE 1.2 04/17/2016 1153   CALCIUM 9.0 01/31/2017 0921   CALCIUM 9.6 04/17/2016 1153   PROT 6.9 08/14/2016 0808   PROT 7.9 04/17/2016 1153   ALBUMIN 3.7 08/14/2016 0808   ALBUMIN 3.2 (L) 04/17/2016 1153   AST 22 08/14/2016 0808   AST 20 04/17/2016 1153   ALT 12 08/14/2016 0808   ALT 16 04/17/2016 1153   ALKPHOS 78 08/14/2016 0808   ALKPHOS 104 04/17/2016 1153   BILITOT 1.1 08/14/2016 0808   BILITOT 1.27 (H) 04/17/2016 1153   GFRNONAA >60 06/21/2016 0941   GFRNONAA 62 02/22/2016 0811   GFRAA >60 06/21/2016 0941   GFRAA 71 02/22/2016 0811     Diabetic Labs (most recent): Lab Results  Component Value Date   HGBA1C 5.5 08/14/2016   HGBA1C 5.6 06/19/2016   HGBA1C 5.9 (H) 02/22/2016     Lipid Panel ( most recent) Lipid Panel     Component Value Date/Time   CHOL 126 10/17/2016 0819   TRIG 138 10/17/2016 0819   HDL 35 (L) 10/17/2016 0819   CHOLHDL 3.6 10/17/2016 0819   VLDL 28 10/17/2016 0819   LDLCALC 63 10/17/2016 0819   Results for NAHOME, BUBLITZ (MRN [id number]) as of 02/21/2017 08:28  Ref. 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  He will be continued on levothyroxine  200 g by mouth every morning.   - We discussed about correct intake of levothyroxine, at fasting, with water, separated by at least 30 minutes from breakfast, and separated by more than 4 hours from calcium, iron, multivitamins, acid reflux medications (PPIs). -Patient is made aware of the fact that thyroid hormone replacement is needed for life, dose to be adjusted by periodic monitoring of thyroid function tests.   2. Diabetes mellitus type 2 in obese ([place]) -Controlled with A1c 5.5%.  He is advised to continue metformin 500 mg by mouth daily.   3. Essential hypertension - Uncontrolled,Improving . I advised him to be consistent in taking his blood pressure medications.  4. Obesity: he is regaining the weight that he lost previously. Suggestion is made for him to avoid simple carbohydrates  from his diet including Cakes, Sweet Desserts, Ice Cream, Soda (diet and regular), Sweet Tea, Candies, Chips, Cookies, Store Bought Juices, Alcohol in Excess of  1-2 drinks a day, Artificial Sweeteners, and "Sugar-free" Products. This will help patient to have stable blood glucose profile and potentially avoid unintended weight gain.  - I advised patient to maintain close follow up with [name], MD for primary care needs. Follow up plan: Return in about 6 months (around 08/21/2017) for follow up with pre-visit labs.  Glade Lloyd, MD Phone: [phone]  Fax: [phone]  This note was partially dictated with voice recognition software. Similar sounding words can be transcribed inadequately or may not  be corrected upon review.  02/21/2017, 8:45 AM

## 2017-02-27 ENCOUNTER — Telehealth: Payer: Self-pay | Admitting: *Deleted

## 2017-02-27 NOTE — Telephone Encounter (Signed)
Okay to refill? 

## 2017-02-27 NOTE — Telephone Encounter (Signed)
Received call from patient.   Requested refill on Percocet.   Ok to refill??  Last office visit 01/31/2017.  Last refill 01/30/2017.

## 2017-02-28 MED ORDER — OXYCODONE-ACETAMINOPHEN 7.5-325 MG PO TABS: 1.0000 | ORAL_TABLET | Freq: Three times a day (TID) | ORAL | 0 refills | Status: DC | PRN

## 2017-02-28 NOTE — Telephone Encounter (Signed)
Prescription printed and patient made aware to come to office to pick up after 2 pm on 02/28/2017.

## 2017-03-07 ENCOUNTER — Other Ambulatory Visit: Payer: Self-pay | Admitting: Family Medicine

## 2017-03-07 ENCOUNTER — Other Ambulatory Visit: Payer: Self-pay | Admitting: Internal Medicine

## 2017-03-07 ENCOUNTER — Other Ambulatory Visit: Payer: Self-pay | Admitting: Nurse Practitioner

## 2017-03-07 ENCOUNTER — Other Ambulatory Visit: Payer: Self-pay | Admitting: "Endocrinology

## 2017-03-23 ENCOUNTER — Other Ambulatory Visit: Payer: Self-pay | Admitting: Nurse Practitioner

## 2017-03-26 ENCOUNTER — Other Ambulatory Visit: Payer: Self-pay | Admitting: Nurse Practitioner

## 2017-03-26 ENCOUNTER — Other Ambulatory Visit: Payer: Self-pay | Admitting: Family Medicine

## 2017-03-26 ENCOUNTER — Telehealth: Payer: Self-pay | Admitting: *Deleted

## 2017-03-26 MED ORDER — OXYCODONE-ACETAMINOPHEN 7.5-325 MG PO TABS: 1.0000 | ORAL_TABLET | Freq: Three times a day (TID) | ORAL | 0 refills | Status: DC | PRN

## 2017-03-26 NOTE — Telephone Encounter (Signed)
Medication Detail    Disp Refills Start End   magnesium oxide (MAG-OX) 400 (241.3 Mg) MG tablet 60 tablet 6 02/15/2017    Sig: TAKE 1 TABLET BY MOUTH TWICE DAILY   Sent to pharmacy as: magnesium oxide (MAG-OX) 400 (241.3 Mg) MG tablet   E-Prescribing Status: Receipt confirmed by pharmacy (02/15/2017 9:21 AM EDT)   Pharmacy   [id number] - [place], [place] [address] AT [place]

## 2017-03-26 NOTE — Telephone Encounter (Signed)
Prescription printed and patient made aware to come to office to pick up 03/27/2017.

## 2017-03-26 NOTE — Telephone Encounter (Signed)
Received call from patient.   Requested refill on Oxycodone.   Ok to refill??  Last office visit 01/31/2017.  Last refill 02/28/2017.

## 2017-03-26 NOTE — Telephone Encounter (Signed)
Medication Detail    Disp Refills Start End   magnesium oxide (MAG-OX) 400 (241.3 Mg) MG tablet 60 tablet 6 02/15/2017    Sig: TAKE 1 TABLET BY MOUTH TWICE DAILY   Sent to pharmacy as: magnesium oxide (MAG-OX) 400 (241.3 Mg) MG tablet   E-Prescribing Status: Receipt confirmed by pharmacy (02/15/2017 9:21 AM EDT)   Pharmacy   [id number] - [place], [place] [address] AT [place]

## 2017-03-26 NOTE — Telephone Encounter (Signed)
Okay to refill? 

## 2017-03-27 ENCOUNTER — Ambulatory Visit (INDEPENDENT_AMBULATORY_CARE_PROVIDER_SITE_OTHER): Payer: Medicare Other

## 2017-03-27 DIAGNOSIS — Z23 Encounter for immunization: Secondary | ICD-10-CM

## 2017-03-27 DIAGNOSIS — C61 Malignant neoplasm of prostate: Secondary | ICD-10-CM | POA: Diagnosis not present

## 2017-03-27 NOTE — Progress Notes (Signed)
Patient was seen in office for flu vaccine. patient received vaccine in left deltoid. Patient tolerate well

## 2017-04-01 ENCOUNTER — Other Ambulatory Visit: Payer: Self-pay | Admitting: Family Medicine

## 2017-04-02 ENCOUNTER — Other Ambulatory Visit: Payer: Self-pay | Admitting: Family Medicine

## 2017-04-03 ENCOUNTER — Ambulatory Visit (INDEPENDENT_AMBULATORY_CARE_PROVIDER_SITE_OTHER): Payer: Medicare Other | Admitting: Urology

## 2017-04-03 DIAGNOSIS — C61 Malignant neoplasm of prostate: Secondary | ICD-10-CM

## 2017-04-03 DIAGNOSIS — N401 Enlarged prostate with lower urinary tract symptoms: Secondary | ICD-10-CM | POA: Diagnosis not present

## 2017-04-03 DIAGNOSIS — R351 Nocturia: Secondary | ICD-10-CM

## 2017-04-12 ENCOUNTER — Telehealth: Payer: Self-pay | Admitting: *Deleted

## 2017-04-12 DIAGNOSIS — Z95818 Presence of other cardiac implants and grafts: Secondary | ICD-10-CM | POA: Insufficient documentation

## 2017-04-12 NOTE — Telephone Encounter (Signed)
Received call from Merrilee Seashore, RN Case Manager with [facility].   Reports that patient has had 11.2lb weight gain in the last 9 days. Patient also reports that he has increased abd girth, but does not have any measurements.   Call placed to patient to inquire. Patient reports that he is continuing to take the Lasix 60mg  PO QD. Denies SOB. Patient does state that he feels like he is not urinating as frequently.   Patient is being followed by cardiology for CHF, A Fib, CAD. Call placed to Dr. Tanna Furry office to schedule appointment. Open appointments are at least 2 weeks out at this time. Scheduler to discuss with triage at cards office.   MD/ PCP to be made aware.

## 2017-04-12 NOTE — Progress Notes (Signed)
Cardiology Office Note:    Date:  04/13/2017   ID:  Peter Dunlap, DOB [birth year], MRN [id number]  PCP:  [name], MD  Cardiologist:  [name], MD    Referring MD: [name], MD   Chief Complaint  Patient presents with  . Congestive Heart Failure    Follow-up    History of Present Illness:    Peter Dunlap is a 61 y.o. male with a hx of PSVT s/p catheter ablation, hypertensive heart disease with diastolic heart failure, symptomatic bradycardia s/p pacemaker, paroxysmal atrial fibrillation, coronary artery disease s/p prior PCI to the LAD and OM.  He has a hx of falls and prior subdural hematoma s/p evacuation.  He has therefore undergone successful Watchman (L atrial appendage occlusion device) procedure and is no longer on anticoagulation.  He is on Sotalol to control his atrial fibrillation.  He was last seen by Dr. [name] in 05/2016.      [name] returns for follow-up on heart failure.  His insurance company has provided him a scale and it was recently noted that his weight went up 19 pounds.  Therefore, follow-up was arranged today.  His weight at home was back to baseline this morning.  He has not been more short of breath.  He has occasional chest discomfort with certain activities.  This has been a chronic symptom for years without significant change.  He denies orthopnea, PND or significant LE edema.  He wears CPAP at night.  He does note occasional increased abdominal girth.  He denies coughing or wheezing.  He denies syncope.  Prior CV studies:   The following studies were reviewed today:  Carotid US 11/30/16 R CEA patent; L 1-39  TEE 09/03/00 Normal systolic function, mild MR  TEE 07/27/15 EF 55-60, normal wall motion, mild MR, mild LAE, small to moderate pericardial effusion  Echo 12/21/14 Severe concentric LVH, EF 60-65, normal wall motion, grade 2 diastolic dysfunction, mildly dilated aortic root (39), MAC, small to moderate  pericardial effusion  Cardiac Catheterization 08/03/14 LAD prox and mid 30, mid stent patent, Dx prox 30 OM stent patent RCA prox/mid/dist 30, PLB ostial 60, PDA ostial 60  PCI 01/26/14 80% proximal OM1 stenosis >> successfully treated with a 2.5 x 20 Promus drug-eluting stent  PCI 12/03/13 PCI of mid LAD with Promus DES  Past Medical History:  Diagnosis Date  . Arteriosclerotic cardiovascular disease (ASCVD) 2005   catheterization in 10/2010:50% mid LAD, diffuse distal disease, circumflex irregularities, large dominant RCA with a 50% ostial, 70% distal, 60% posterolateral and 70% PDA; normal EF  . Arthritis   . Benign prostatic hypertrophy   . Cerebrovascular disease 2010   R. carotid endarterectomy; Duplex in 10/2010-widely patent ICAs, subtotal left vertebral-not thought to be contributing to symptoms  . Cervical spine disease    CT in 2012-advanced degeneration and spondylosis with moderate spinal stenosis at C3-C6  . Depression   . Erectile dysfunction   . Gastroesophageal reflux disease   . H/O hiatal hernia   . H/O: substance abuse    Cocaine, marijuana, alcohol.  Quit 2013.   [name] Hyperlipidemia   . Hypertension   . Non-ST elevation myocardial infarction (NSTEMI), initial episode of care [facility]) 12/02/2013   DES LAD  . Obesity   . Prostate cancer ([place])   . Sleep apnea    CPAP  . Tachy-brady syndrome ([place])    a. s/p STJ dual chamber PPM   . Thyroid disease   .  Tobacco abuse    Quit 2014    Past Surgical History:  Procedure Laterality Date  . BRAIN SURGERY  2015   hematoma evacuation  . CAROTID ENDARTERECTOMY Right Feb. 25, 2010    CEA  . CORONARY ANGIOPLASTY WITH STENT PLACEMENT  12/03/2013   LAD 90%-->0% W/ Promus Premier DES 3.0 mm x 16 mm, CFX OK, RCA 40%, EF 70-75%  . RADIOFREQUENCY ABLATION  2005   for PSVT    Current Medications: Current Meds  Medication Sig  . allopurinol (ZYLOPRIM) 100 MG tablet TAKE 1 TABLET BY MOUTH EVERY DAY  . aspirin 81 MG chewable  tablet Chew 81 mg by mouth daily.   . colchicine 0.6 MG tablet Take 1 tablet (0.6 mg total) by mouth daily.  [name] DEXILANT 60 MG capsule TAKE 1 CAPSULE BY MOUTH EVERY DAY  . furosemide (LASIX) 20 MG tablet TAKE 3 TABLETS BY MOUTH DAILY  . isosorbide mononitrate (IMDUR) 60 MG 24 hr tablet TAKE 1 TABLET BY MOUTH EVERY DAY  . levothyroxine (SYNTHROID, LEVOTHROID) 200 MCG tablet Take 1 tablet (200 mcg total) by mouth daily before breakfast.  . magnesium oxide (MAG-OX) 400 (241.3 Mg) MG tablet TAKE 1 TABLET BY MOUTH TWICE DAILY  . metFORMIN (GLUCOPHAGE) 500 MG tablet Take 1 tablet (500 mg total) by mouth daily with breakfast.  . nitroGLYCERIN (NITROSTAT) 0.4 MG SL tablet PLACE 1 TABLET UNDER THE TONGUE EVERY 5 MINUTES AS NEEDED FOR CHEST PAIN. CALL 911 AT THIRD DOSE IN 15 MINUTES  . ONE TOUCH ULTRA TEST test strip USE TO TEST ONCE DAILY  . oxyCODONE-acetaminophen (PERCOCET) 7.5-325 MG tablet Take 1 tablet by mouth every 8 (eight) hours as needed for severe pain.  . potassium chloride SA (K-DUR,KLOR-CON) 20 MEQ tablet TAKE 1 TABLET BY MOUTH EVERY DAY  . RAPAFLO 8 MG CAPS capsule TAKE 1 CAPSULE(8 MG) BY MOUTH DAILY  . simvastatin (ZOCOR) 40 MG tablet TAKE 1 TABLET(40 MG) BY MOUTH DAILY  . sotalol (BETAPACE) 160 MG tablet TAKE 1 TABLET(160 MG) BY MOUTH TWICE DAILY  . traZODone (DESYREL) 50 MG tablet TAKE 1/2 TO 1 TABLET BY MOUTH AT BEDTIME AS NEEDED FOR SLEEP     Allergies:   Lactose intolerance (gi)   Social History   Tobacco Use  . Smoking status: Former Smoker    Packs/day: 1.00    Years: 40.00    Pack years: 40.00    Types: Cigarettes    Start date: 10/20/1972    Last attempt to quit: 10/10/2012    Years since quitting: 4.5  . Smokeless tobacco: Never Used  . Tobacco comment: Quit in May.   Substance Use Topics  . Alcohol use: No    Alcohol/week: 0.0 oz    Comment: former drinker-- sober since 2013.   . Drug use: No    Comment: quit cocaine 10/2011     Family Hx: The patient's family  history includes Cancer in his maternal grandmother, maternal uncle, and sister; Coronary artery disease (age of onset: 58) in his father; Diabetes in his brother, mother, and sister; Diabetes type II in his father; Heart attack in his father; Heart attack (age of onset: 53) in his sister; Hypertension in his brother, father, mother, and sister; Lung cancer in his paternal uncle.  ROS:   Please see the history of present illness.    Review of Systems  Constitution: Positive for diaphoresis.  Cardiovascular: Positive for irregular heartbeat.  Respiratory: Positive for wheezing.   Musculoskeletal: Positive for back pain.  All other systems reviewed and are negative.   EKGs/Labs/Other Test Reviewed:    EKG:  EKG is  ordered today.  The ekg ordered today demonstrates atrial fibrillation, HR 72  Recent Labs: 06/21/2016: Hemoglobin 15.9; Platelets 153 08/14/2016: ALT 12 11/20/2016: TSH 1.32 01/31/2017: BUN 10; Creat 1.20; Magnesium 2.0; Potassium 4.5; Sodium 141   Recent Lipid Panel Lab Results  Component Value Date/Time   CHOL 126 10/17/2016 08:19 AM   TRIG 138 10/17/2016 08:19 AM   HDL 35 (L) 10/17/2016 08:19 AM   CHOLHDL 3.6 10/17/2016 08:19 AM   LDLCALC 63 10/17/2016 08:19 AM    Physical Exam:    VS:  BP 102/60   Pulse 72   Ht 6' (1.829 m)   Wt (!) 312 lb 1.9 oz (141.6 kg)   SpO2 98%   BMI 42.33 kg/m     Wt Readings from Last 3 Encounters:  04/13/17 (!) 312 lb 1.9 oz (141.6 kg)  02/21/17 (!) 313 lb (142 kg)  01/31/17 (!) 316 lb (143.3 kg)     Physical Exam  Constitutional: He is oriented to person, place, and time. He appears well-developed and well-nourished. No distress.  HENT:  Head: Normocephalic and atraumatic.  Neck: No JVD present.  Cardiovascular: Normal rate. An irregularly irregular rhythm present.  No murmur heard. Pulmonary/Chest: Effort normal. He has no rales.  Abdominal: Soft.  Musculoskeletal: He exhibits no edema.  Neurological: He is alert and  oriented to person, place, and time.  Skin: Skin is warm and dry.    ASSESSMENT:    1. Chronic diastolic heart failure ([place])   2. Coronary artery disease involving native coronary artery of native heart without angina pectoris   3. Hypertensive heart disease with chronic diastolic congestive heart failure (HCC)   4. Paroxysmal atrial fibrillation ([place])   5. Pacemaker-St Jude   6. Presence of Watchman left atrial appendage closure device    PLAN:    In order of problems listed above:  1.  Chronic diastolic heart failure ([place]) He presents to the office today for evaluation of heart failure secondary to significant weight gain based upon his scales at home.  Weights here have been fairly stable.  He does not display any significant signs of volume excess on exam today.  He is New York Heart Association class II.  I have recommended obtaining a basic metabolic panel and BNP today.  I suspect his BNP will be somewhat elevated secondary to atrial fibrillation.  However, if it is significantly elevated, adjust his Lasix for several days.  2.  Coronary artery disease involving native coronary artery of native heart without angina pectoris History of stenting to the LAD and OM.  He has occasional chest discomfort but this is chronic and stable without significant change.  Continue aspirin, nitrates, statin.  3.  Hypertensive heart disease with chronic diastolic congestive heart failure ([place]) The patient's blood pressure is controlled on his current regimen.  Continue current therapy.   4.  Paroxysmal atrial fibrillation (HCC) He is in atrial fibrillation today.  Rate is controlled.  He is asymptomatic.  Last remote monitoring on his device in September demonstrated that he was in atrial fibrillation 66% of the time.  Continue sotalol.  He is not an anticoagulation candidate secondary to prior history of subdural hematoma.  5.  Pacemaker-St Jude Follow-up with EP as planned.  6.  Presence of  Watchman left atrial appendage closure device   He should be on aspirin 325 mg daily  indefinitely.  I have asked him to increase his aspirin to 325 mg.   Dispo:  Return in about 4 weeks (around 05/11/2017) for Routine Follow Up with Dr. Lovena Le.   Medication Adjustments/Labs and Tests Ordered: Current medicines are reviewed at length with the patient today.  Concerns regarding medicines are outlined above.  Tests Ordered: Orders Placed This Encounter  Procedures  . Basic Metabolic Panel (BMET)  . Pro b natriuretic peptide  . EKG 12-Lead   Medication Changes: Meds ordered this encounter  Medications  . aspirin EC 325 MG tablet    Sig: Take 1 tablet (325 mg total) daily by mouth.    Dispense:  30 tablet    Refill:  0    Signed, Richardson Dopp, PA-C  04/13/2017 9:06 AM    [facility] [place], [place], [place]  [address] Phone: [phone]; Fax: [phone]

## 2017-04-12 NOTE — Telephone Encounter (Signed)
Request reviewed.  Pt scheduled with Richardson Dopp on 04/13/2017 to be seen for recent weight gain.  Pt sent recall letter to schedule yearly appt with Dr. Lovena Le in December.  Gave to scheduler to make appt with Dr. Lovena Le in December.  No further action needed at this time.

## 2017-04-13 ENCOUNTER — Encounter: Payer: Self-pay | Admitting: Physician Assistant

## 2017-04-13 ENCOUNTER — Ambulatory Visit (INDEPENDENT_AMBULATORY_CARE_PROVIDER_SITE_OTHER): Payer: Medicare Other | Admitting: Physician Assistant

## 2017-04-13 VITALS — BP 102/60 | HR 72 | Ht 72.0 in | Wt 312.1 lb

## 2017-04-13 DIAGNOSIS — Z95818 Presence of other cardiac implants and grafts: Secondary | ICD-10-CM | POA: Diagnosis not present

## 2017-04-13 DIAGNOSIS — I110 Hypertensive heart disease with heart failure: Secondary | ICD-10-CM

## 2017-04-13 DIAGNOSIS — I5032 Chronic diastolic (congestive) heart failure: Secondary | ICD-10-CM | POA: Diagnosis not present

## 2017-04-13 DIAGNOSIS — Z950 Presence of cardiac pacemaker: Secondary | ICD-10-CM

## 2017-04-13 DIAGNOSIS — I480 Paroxysmal atrial fibrillation: Secondary | ICD-10-CM

## 2017-04-13 DIAGNOSIS — I2510 Atherosclerotic heart disease of native coronary artery without angina pectoris: Secondary | ICD-10-CM

## 2017-04-13 LAB — BASIC METABOLIC PANEL
BUN/Creatinine Ratio: 8 — ABNORMAL LOW (ref 10–24)
BUN: 10 mg/dL (ref 8–27)
CO2: 23 mmol/L (ref 20–29)
Calcium: 9.3 mg/dL (ref 8.6–10.2)
Chloride: 99 mmol/L (ref 96–106)
Creatinine, Ser: 1.19 mg/dL (ref 0.76–1.27)
GFR calc Af Amer: 76 mL/min/{1.73_m2} (ref >59)
GFR calc non Af Amer: 66 mL/min/{1.73_m2} (ref >59)
Glucose: 117 mg/dL — ABNORMAL HIGH (ref 65–99)
Potassium: 4.2 mmol/L (ref 3.5–5.2)
Sodium: 138 mmol/L (ref 134–144)

## 2017-04-13 LAB — PRO B NATRIURETIC PEPTIDE: NT-Pro BNP: 638 pg/mL — ABNORMAL HIGH (ref 0–210)

## 2017-04-13 MED ORDER — ASPIRIN EC 325 MG PO TBEC: 325.0000 mg | DELAYED_RELEASE_TABLET | Freq: Every day | ORAL | 0 refills | Status: DC

## 2017-04-13 NOTE — Patient Instructions (Signed)
Medication Instructions:  Your physician has recommended you make the following change in your medication:  1. Increase Asprin to (325 mg ) daily.   Labwork: Your physician recommends that you have lab work today BNP/BMET   Testing/Procedures: -None  Follow-Up: Your physician recommends that you keep your scheduled follow-up appointment with Dr. Lovena Le in December.   Any Other Special Instructions Will Be Listed Below (If Applicable).     If you need a refill on your cardiac medications before your next appointment, please call your pharmacy.

## 2017-04-13 NOTE — Telephone Encounter (Signed)
noted 

## 2017-04-17 ENCOUNTER — Telehealth: Payer: Self-pay | Admitting: *Deleted

## 2017-04-17 NOTE — Telephone Encounter (Signed)
SPOKE TO PT AND PT AWARE OF A RESULTS AND  WAS AWARE WITH FOLLOW UP WITH  TAYLOR ON 12-14@ 10:15 AM

## 2017-04-21 ENCOUNTER — Other Ambulatory Visit: Payer: Self-pay | Admitting: Internal Medicine

## 2017-04-21 DIAGNOSIS — R634 Abnormal weight loss: Secondary | ICD-10-CM

## 2017-04-21 DIAGNOSIS — M10071 Idiopathic gout, right ankle and foot: Secondary | ICD-10-CM

## 2017-04-21 DIAGNOSIS — I209 Angina pectoris, unspecified: Secondary | ICD-10-CM

## 2017-04-21 DIAGNOSIS — R0600 Dyspnea, unspecified: Secondary | ICD-10-CM

## 2017-04-23 ENCOUNTER — Telehealth: Payer: Self-pay | Admitting: *Deleted

## 2017-04-23 MED ORDER — OXYCODONE-ACETAMINOPHEN 7.5-325 MG PO TABS: 1.0000 | ORAL_TABLET | Freq: Three times a day (TID) | ORAL | 0 refills | Status: DC | PRN

## 2017-04-23 NOTE — Telephone Encounter (Signed)
Prescription printed and patient made aware to come to office to pick up after 3pm on 04/23/2017. 

## 2017-04-23 NOTE — Telephone Encounter (Signed)
Records indicate prescription refill appropriate for Percocet.  Ok to refill??  Last office visit  01/31/2017.  Last refill 03/26/2017.

## 2017-04-23 NOTE — Telephone Encounter (Signed)
Okay to refill? 

## 2017-05-01 DIAGNOSIS — G4733 Obstructive sleep apnea (adult) (pediatric): Secondary | ICD-10-CM | POA: Diagnosis not present

## 2017-05-10 ENCOUNTER — Other Ambulatory Visit: Payer: Self-pay | Admitting: Family Medicine

## 2017-05-15 ENCOUNTER — Ambulatory Visit (INDEPENDENT_AMBULATORY_CARE_PROVIDER_SITE_OTHER): Payer: Medicare Other | Admitting: *Deleted

## 2017-05-15 DIAGNOSIS — I495 Sick sinus syndrome: Secondary | ICD-10-CM | POA: Diagnosis not present

## 2017-05-16 ENCOUNTER — Ambulatory Visit (INDEPENDENT_AMBULATORY_CARE_PROVIDER_SITE_OTHER): Payer: Medicare Other | Admitting: Internal Medicine

## 2017-05-16 ENCOUNTER — Encounter: Payer: Self-pay | Admitting: Internal Medicine

## 2017-05-16 ENCOUNTER — Encounter (INDEPENDENT_AMBULATORY_CARE_PROVIDER_SITE_OTHER): Payer: Self-pay

## 2017-05-16 VITALS — BP 120/60 | HR 46 | Ht 72.0 in | Wt 319.2 lb

## 2017-05-16 DIAGNOSIS — Z950 Presence of cardiac pacemaker: Secondary | ICD-10-CM | POA: Diagnosis not present

## 2017-05-16 DIAGNOSIS — I495 Sick sinus syndrome: Secondary | ICD-10-CM | POA: Diagnosis not present

## 2017-05-16 DIAGNOSIS — Z95818 Presence of other cardiac implants and grafts: Secondary | ICD-10-CM | POA: Diagnosis not present

## 2017-05-16 DIAGNOSIS — I480 Paroxysmal atrial fibrillation: Secondary | ICD-10-CM | POA: Diagnosis not present

## 2017-05-16 LAB — CUP PACEART INCLINIC DEVICE CHECK
Battery Remaining Longevity: 133 mo
Battery Voltage: 2.98 V
Brady Statistic RA Percent Paced: 3.5 %
Brady Statistic RV Percent Paced: 34 %
Date Time Interrogation Session: 20181212163809
Implantable Lead Implant Date: 20160415
Implantable Lead Implant Date: 20160415
Implantable Lead Location: 753859
Implantable Lead Location: 753860
Implantable Pulse Generator Implant Date: 20160415
Lead Channel Impedance Value: 425 Ohm
Lead Channel Impedance Value: 562.5 Ohm
Lead Channel Pacing Threshold Amplitude: 0.75 V
Lead Channel Pacing Threshold Amplitude: 0.75 V
Lead Channel Pacing Threshold Amplitude: 1 V
Lead Channel Pacing Threshold Amplitude: 1 V
Lead Channel Pacing Threshold Pulse Width: 0.5 ms
Lead Channel Pacing Threshold Pulse Width: 0.5 ms
Lead Channel Pacing Threshold Pulse Width: 0.5 ms
Lead Channel Pacing Threshold Pulse Width: 0.5 ms
Lead Channel Sensing Intrinsic Amplitude: 12 mV
Lead Channel Sensing Intrinsic Amplitude: 5 mV
Lead Channel Setting Pacing Amplitude: 2 V
Lead Channel Setting Pacing Amplitude: 2.5 V
Lead Channel Setting Pacing Pulse Width: 0.5 ms
Lead Channel Setting Sensing Sensitivity: 2 mV
Pulse Gen Model: 2240
Pulse Gen Serial Number: 7756161

## 2017-05-16 NOTE — Patient Instructions (Signed)
Medication Instructions:  Your physician recommends that you continue on your current medications as directed. Please refer to the Current Medication list given to you today.  Labwork: None ordered.  Testing/Procedures: None ordered.  Follow-Up: Your physician wants you to follow-up in: one year with Dr. Lovena Le.   You will receive a reminder letter in the mail two months in advance. If you don't receive a letter, please call our office to schedule the follow-up appointment.  Remote monitoring is used to monitor your Pacemaker from home. This monitoring reduces the number of office visits required to check your device to one time per year. It allows [place] to keep an eye on the functioning of your device to ensure it is working properly. You are scheduled for a device check from home on 08/14/2017. You may send your transmission at any time that day. If you have a wireless device, the transmission will be sent automatically. After your physician reviews your transmission, you will receive a postcard with your next transmission date.    Any Other Special Instructions Will Be Listed Below (If Applicable).     If you need a refill on your cardiac medications before your next appointment, please call your pharmacy.

## 2017-05-16 NOTE — Progress Notes (Signed)
HPI Peter Dunlap returns today for followup. He is a pleasant 61 yo man with a h/o HTN, PAF, obesity, CNS bleeding, s/p insertion of a Walkman device. He denies chest pain or sob. No edema. He has some dyspnea with exertion but is sedentary for the most part. His weight is up and down, mostly up. He is still wearing his CPAP. He remains on sotalol for his atrial fib.  Allergies  Allergen Reactions  . Lactose Intolerance (Gi) Other (See Comments)    UPSET STOMACH      Current Outpatient Medications  Medication Sig Dispense Refill  . allopurinol (ZYLOPRIM) 100 MG tablet TAKE 1 TABLET BY MOUTH EVERY DAY 90 tablet 0  . aspirin 81 MG chewable tablet Chew 81 mg by mouth daily.     [name] aspirin EC 325 MG tablet Take 1 tablet (325 mg total) daily by mouth. 30 tablet 0  . colchicine 0.6 MG tablet Take 1 tablet (0.6 mg total) by mouth daily. 15 tablet 0  . DEXILANT 60 MG capsule TAKE 1 CAPSULE BY MOUTH EVERY DAY 30 capsule 0  . DEXILANT 60 MG capsule TAKE 1 CAPSULE BY MOUTH EVERY DAY 30 capsule 0  . furosemide (LASIX) 20 MG tablet TAKE 3 TABLETS BY MOUTH DAILY 90 tablet 2  . isosorbide mononitrate (IMDUR) 60 MG 24 hr tablet TAKE 1 TABLET(60 MG) BY MOUTH DAILY 90 tablet 3  . levothyroxine (SYNTHROID, LEVOTHROID) 200 MCG tablet Take 1 tablet (200 mcg total) by mouth daily before breakfast. 30 tablet 6  . magnesium oxide (MAG-OX) 400 (241.3 Mg) MG tablet TAKE 1 TABLET BY MOUTH TWICE DAILY 60 tablet 6  . metFORMIN (GLUCOPHAGE) 500 MG tablet Take 1 tablet (500 mg total) by mouth daily with breakfast. 30 tablet 6  . nitroGLYCERIN (NITROSTAT) 0.4 MG SL tablet PLACE 1 TABLET UNDER THE TONGUE EVERY 5 MINUTES AS NEEDED FOR CHEST PAIN. CALL 911 AT THIRD DOSE IN 15 MINUTES 25 tablet 1  . ONE TOUCH ULTRA TEST test strip USE TO TEST ONCE DAILY 50 each 1  . oxyCODONE-acetaminophen (PERCOCET) 7.5-325 MG tablet Take 1 tablet every 8 (eight) hours as needed by mouth for severe pain. 60 tablet 0  . potassium  chloride SA (K-DUR,KLOR-CON) 20 MEQ tablet TAKE 1 TABLET BY MOUTH EVERY DAY 30 tablet 11  . RAPAFLO 8 MG CAPS capsule TAKE 1 CAPSULE(8 MG) BY MOUTH DAILY 30 capsule 0  . simvastatin (ZOCOR) 40 MG tablet TAKE 1 TABLET(40 MG) BY MOUTH DAILY 90 tablet 3  . sotalol (BETAPACE) 160 MG tablet TAKE 1 TABLET(160 MG) BY MOUTH TWICE DAILY 60 tablet 6  . traZODone (DESYREL) 50 MG tablet TAKE 1/2 TO 1 TABLET BY MOUTH AT BEDTIME AS NEEDED FOR SLEEP 30 tablet 3   No current facility-administered medications for this visit.      Past Medical History:  Diagnosis Date  . Arteriosclerotic cardiovascular disease (ASCVD) 2005   catheterization in 10/2010:50% mid LAD, diffuse distal disease, circumflex irregularities, large dominant RCA with a 50% ostial, 70% distal, 60% posterolateral and 70% PDA; normal EF  . Arthritis   . Benign prostatic hypertrophy   . Cerebrovascular disease 2010   R. carotid endarterectomy; Duplex in 10/2010-widely patent ICAs, subtotal left vertebral-not thought to be contributing to symptoms  . Cervical spine disease    CT in 2012-advanced degeneration and spondylosis with moderate spinal stenosis at C3-C6  . Depression   . Erectile dysfunction   . Gastroesophageal reflux disease   .  H/O hiatal hernia   . H/O: substance abuse    Cocaine, marijuana, alcohol.  Quit 2013.   [name] Hyperlipidemia   . Hypertension   . Non-ST elevation myocardial infarction (NSTEMI), initial episode of care [facility]) 12/02/2013   DES LAD  . Obesity   . Prostate cancer ([place])   . Sleep apnea    CPAP  . Tachy-brady syndrome ([place])    a. s/p STJ dual chamber PPM   . Thyroid disease   . Tobacco abuse    Quit 2014    ROS:   All systems reviewed and negative except as noted in the HPI.   Past Surgical History:  Procedure Laterality Date  . BRAIN SURGERY  2015   hematoma evacuation  . BURR HOLE Right 04/13/2014   Procedure: Haskell Flirt;  Surgeon: Charlie Pitter, MD;  Location: [place] NEURO ORS;  Service:  Neurosurgery;  Laterality: Right;  . CAROTID ENDARTERECTOMY Right Feb. 25, 2010    CEA  . CORONARY ANGIOPLASTY WITH STENT PLACEMENT  12/03/2013   LAD 90%-->0% W/ Promus Premier DES 3.0 mm x 16 mm, CFX OK, RCA 40%, EF 70-75%  . LEFT ATRIAL APPENDAGE OCCLUSION N/A 08/05/2015   Procedure: LEFT ATRIAL APPENDAGE OCCLUSION;  Surgeon: [name], MD;  Location: [place] [facility] CV LAB;  Service: Cardiovascular;  Laterality: N/A;  . LEFT HEART CATHETERIZATION WITH CORONARY ANGIOGRAM Left 12/03/2013   Procedure: LEFT HEART CATHETERIZATION WITH CORONARY ANGIOGRAM;  Surgeon: Leonie Man, MD;  Location: [facility] CATH LAB;  Service: Cardiovascular;  Laterality: Left;  . LEFT HEART CATHETERIZATION WITH CORONARY ANGIOGRAM N/A 01/26/2014   Procedure: LEFT HEART CATHETERIZATION WITH CORONARY ANGIOGRAM;  Surgeon: Jettie Booze, MD;  Location: [facility] CATH LAB;  Service: Cardiovascular;  Laterality: N/A;  . LEFT HEART CATHETERIZATION WITH CORONARY ANGIOGRAM N/A 08/03/2014   Procedure: LEFT HEART CATHETERIZATION WITH CORONARY ANGIOGRAM;  Surgeon: Burnell Blanks, MD;  Location: [facility] CATH LAB;  Service: Cardiovascular;  Laterality: N/A;  . PERCUTANEOUS CORONARY STENT INTERVENTION (PCI-S)  12/03/2013   Procedure: PERCUTANEOUS CORONARY STENT INTERVENTION (PCI-S);  Surgeon: Leonie Man, MD;  Location: [facility] CATH LAB;  Service: Cardiovascular;;  . PERMANENT PACEMAKER INSERTION N/A 09/18/2014   Procedure: PERMANENT PACEMAKER INSERTION;  Surgeon: [name], MD;  Location: [facility] CATH LAB;  Service: Cardiovascular;  Laterality: N/A;  . RADIOFREQUENCY ABLATION  2005   for PSVT  . TEE WITHOUT CARDIOVERSION N/A 07/27/2015   Procedure: TRANSESOPHAGEAL ECHOCARDIOGRAM (TEE);  Surgeon: Lelon Perla, MD;  Location: [facility] ENDOSCOPY;  Service: Cardiovascular;  Laterality: N/A;  . TEE WITHOUT CARDIOVERSION N/A 09/15/2015   Procedure: TRANSESOPHAGEAL ECHOCARDIOGRAM (TEE);  Surgeon: Thayer Headings, MD;  Location: [facility] ENDOSCOPY;  Service:  Cardiovascular;  Laterality: N/A;     Family History  Problem Relation Age of Onset  . Hypertension Mother        Cerebrovascular disease  . Diabetes Mother   . Coronary artery disease Father 24  . Diabetes type II Father   . Hypertension Father   . Heart attack Father   . Diabetes Brother   . Hypertension Brother   . Lung cancer Paternal Uncle   . Diabetes Sister   . Hypertension Sister   . Heart attack Sister 9  . Cancer Sister        leukemia  . Cancer Maternal Uncle        breast  . Cancer Maternal Grandmother        breast     Social History  Socioeconomic History  . Marital status: Married    Spouse name: Not on file  . Number of children: 0  . Years of education: Not on file  . Highest education level: Not on file  Social Needs  . Financial resource strain: Not on file  . Food insecurity - worry: Not on file  . Food insecurity - inability: Not on file  . Transportation needs - medical: Not on file  . Transportation needs - non-medical: Not on file  Occupational History  . Occupation: Retired  Tobacco Use  . Smoking status: Former Smoker    Packs/day: 1.00    Years: 40.00    Pack years: 40.00    Types: Cigarettes    Start date: 10/20/1972    Last attempt to quit: 10/10/2012    Years since quitting: 4.6  . Smokeless tobacco: Never Used  . Tobacco comment: Quit in May.   Substance and Sexual Activity  . Alcohol use: No    Alcohol/week: 0.0 oz    Comment: former drinker-- sober since 2013.   . Drug use: No    Comment: quit cocaine 10/2011  . Sexual activity: Yes    Partners: Female  Other Topics Concern  . Not on file  Social History Narrative   Lives in [place].     BP 120/60   Pulse (!) 46   Ht 6' (1.829 m)   Wt (!) 319 lb 3.2 oz (144.8 kg)   SpO2 96%   BMI 43.29 kg/m   Physical Exam:  Well appearing 61 yo man, NAD HEENT: Unremarkable Neck:  6 cm JVD, no thyromegally Lymphatics:  No adenopathy Back:  No CVA tenderness Lungs:   Clear with no wheezes HEART:  Regular rate rhythm, no murmurs, no rubs, no clicks Abd:  soft, positive bowel sounds, no organomegally, no rebound, no guarding Ext:  2 plus pulses, no edema, no cyanosis, no clubbing Skin:  No rashes no nodules Neuro:  CN II through XII intact, motor grossly intact  EKG - none today  DEVICE  Normal device function.  See PaceArt for details.   Assess/Plan: 1. PAF - he is in NSR today. He will continue his sotalol. He has occaisional palpitations. He is s/p watchman. 2. Obesity - I have asked the patient to lose weight. Will follow. 3. Sinus node dysfunction - he is asymptomatic, s/p PPM insertion. Will follow. 4. PPM - his St. Jude DDD PM is working normally. Will recheck in several months.  Peter Dunlap.D.

## 2017-05-16 NOTE — Progress Notes (Signed)
Remote pacemaker transmission.   

## 2017-05-18 ENCOUNTER — Encounter: Payer: Medicare Other | Admitting: Internal Medicine

## 2017-05-18 ENCOUNTER — Encounter: Payer: Self-pay | Admitting: Cardiology

## 2017-05-21 ENCOUNTER — Telehealth: Payer: Self-pay | Admitting: *Deleted

## 2017-05-21 NOTE — Telephone Encounter (Signed)
Okay to refill? 

## 2017-05-21 NOTE — Telephone Encounter (Signed)
Received call from patient.   Requested refill on Oxycodone.   Ok to refill??  Last office visit 01/31/2017.  Last refill 04/23/2017.

## 2017-05-22 MED ORDER — OXYCODONE-ACETAMINOPHEN 7.5-325 MG PO TABS: 1.0000 | ORAL_TABLET | Freq: Three times a day (TID) | ORAL | 0 refills | Status: DC | PRN

## 2017-05-22 NOTE — Telephone Encounter (Signed)
Prescription printed and patient made aware to come to office to pick up after 2pm on 05/22/2017. 

## 2017-05-24 ENCOUNTER — Other Ambulatory Visit: Payer: Self-pay | Admitting: Internal Medicine

## 2017-05-24 ENCOUNTER — Other Ambulatory Visit: Payer: Self-pay | Admitting: Family Medicine

## 2017-05-30 ENCOUNTER — Other Ambulatory Visit: Payer: Self-pay | Admitting: "Endocrinology

## 2017-05-31 ENCOUNTER — Other Ambulatory Visit: Payer: Self-pay | Admitting: Family Medicine

## 2017-06-01 ENCOUNTER — Telehealth: Payer: Self-pay | Admitting: *Deleted

## 2017-06-01 NOTE — Telephone Encounter (Signed)
Received call from Merrilee Seashore, RN Case Manager with [facility].   Reports that patient has had 6lb weight loss in the last 2 days.   Call placed to patient.   Patient reports that he feels fine with no Sx of dehydration D/T Lasix. Reports that he does not weigh exactly first thing in the morning and his clothing does fluctuate as well.   Recommended weighing early in AM in similar clothing every day. Advised to contact office if weight loss/ gain >3lbs per day or >5lbs per week noted.   Verbalized understanding.

## 2017-06-04 NOTE — Telephone Encounter (Signed)
noted 

## 2017-06-07 LAB — CUP PACEART REMOTE DEVICE CHECK
Battery Remaining Longevity: 121 mo
Battery Remaining Percentage: 95.5 %
Battery Voltage: 2.98 V
Brady Statistic AP VP Percent: 1 %
Brady Statistic AP VS Percent: 11 %
Brady Statistic AS VP Percent: 2 %
Brady Statistic AS VS Percent: 86 %
Brady Statistic RA Percent Paced: 3.5 %
Brady Statistic RV Percent Paced: 34 %
Date Time Interrogation Session: 20181210082132
Implantable Lead Implant Date: 20160415
Implantable Lead Implant Date: 20160415
Implantable Lead Location: 753859
Implantable Lead Location: 753860
Implantable Pulse Generator Implant Date: 20160415
Lead Channel Impedance Value: 440 Ohm
Lead Channel Impedance Value: 580 Ohm
Lead Channel Pacing Threshold Amplitude: 0.25 V
Lead Channel Pacing Threshold Amplitude: 0.75 V
Lead Channel Pacing Threshold Pulse Width: 0.5 ms
Lead Channel Pacing Threshold Pulse Width: 0.5 ms
Lead Channel Sensing Intrinsic Amplitude: 12 mV
Lead Channel Sensing Intrinsic Amplitude: 5 mV
Lead Channel Setting Pacing Amplitude: 2 V
Lead Channel Setting Pacing Amplitude: 2.5 V
Lead Channel Setting Pacing Pulse Width: 0.5 ms
Lead Channel Setting Sensing Sensitivity: 2 mV
Pulse Gen Model: 2240
Pulse Gen Serial Number: 7756161

## 2017-06-08 ENCOUNTER — Other Ambulatory Visit: Payer: Self-pay | Admitting: Family Medicine

## 2017-06-08 ENCOUNTER — Telehealth: Payer: Self-pay | Admitting: *Deleted

## 2017-06-08 NOTE — Telephone Encounter (Signed)
noted 

## 2017-06-08 NOTE — Telephone Encounter (Signed)
Received call from Merrilee Seashore, RN Case Manager with [facility].   Reports that patient has had 6.2lb weight gain in the last 3 days.   Call placed to patient to inquire. Patient denies SOB/edema. States that he had stomach issues for a few days and was not eating like he usually does. States that he has been feeling better and has been eating normal again.   MD to be made aware.

## 2017-06-21 ENCOUNTER — Other Ambulatory Visit: Payer: Self-pay | Admitting: *Deleted

## 2017-06-21 NOTE — Telephone Encounter (Signed)
Received call from patient.   Requested refill on Oxycodone/ APAP.   Ok to refill??  Last office visit 01/31/2017.  Last refill 05/22/2017.

## 2017-06-22 MED ORDER — OXYCODONE-ACETAMINOPHEN 7.5-325 MG PO TABS: 1.0000 | ORAL_TABLET | Freq: Three times a day (TID) | ORAL | 0 refills | Status: DC | PRN

## 2017-07-01 ENCOUNTER — Other Ambulatory Visit: Payer: Self-pay | Admitting: Family Medicine

## 2017-07-11 ENCOUNTER — Other Ambulatory Visit: Payer: Self-pay | Admitting: Family Medicine

## 2017-07-23 ENCOUNTER — Other Ambulatory Visit: Payer: Self-pay

## 2017-07-23 MED ORDER — OXYCODONE-ACETAMINOPHEN 7.5-325 MG PO TABS: 1.0000 | ORAL_TABLET | Freq: Three times a day (TID) | ORAL | 0 refills | Status: DC | PRN

## 2017-07-23 NOTE — Telephone Encounter (Signed)
Last OV 01/31/2017 Last refill 06/22/2017 #60 Okay to refill?

## 2017-07-31 ENCOUNTER — Other Ambulatory Visit: Payer: Self-pay | Admitting: Family Medicine

## 2017-07-31 DIAGNOSIS — G4733 Obstructive sleep apnea (adult) (pediatric): Secondary | ICD-10-CM | POA: Diagnosis not present

## 2017-08-10 ENCOUNTER — Other Ambulatory Visit: Payer: Self-pay | Admitting: Internal Medicine

## 2017-08-10 DIAGNOSIS — R0600 Dyspnea, unspecified: Secondary | ICD-10-CM

## 2017-08-10 DIAGNOSIS — M10071 Idiopathic gout, right ankle and foot: Secondary | ICD-10-CM

## 2017-08-10 DIAGNOSIS — R634 Abnormal weight loss: Secondary | ICD-10-CM

## 2017-08-10 DIAGNOSIS — I209 Angina pectoris, unspecified: Secondary | ICD-10-CM

## 2017-08-13 DIAGNOSIS — E1169 Type 2 diabetes mellitus with other specified complication: Secondary | ICD-10-CM | POA: Diagnosis not present

## 2017-08-13 DIAGNOSIS — E559 Vitamin D deficiency, unspecified: Secondary | ICD-10-CM | POA: Diagnosis not present

## 2017-08-13 DIAGNOSIS — E890 Postprocedural hypothyroidism: Secondary | ICD-10-CM | POA: Diagnosis not present

## 2017-08-14 ENCOUNTER — Ambulatory Visit (INDEPENDENT_AMBULATORY_CARE_PROVIDER_SITE_OTHER): Payer: Medicare Other | Admitting: *Deleted

## 2017-08-14 DIAGNOSIS — I495 Sick sinus syndrome: Secondary | ICD-10-CM

## 2017-08-14 LAB — TSH: TSH: 1.71 mIU/L (ref 0.40–4.50)

## 2017-08-14 LAB — VITAMIN D 25 HYDROXY (VIT D DEFICIENCY, FRACTURES): Vit D, 25-Hydroxy: 23 ng/mL — ABNORMAL LOW (ref 30–100)

## 2017-08-14 LAB — HEMOGLOBIN A1C
Hgb A1c MFr Bld: 6.7 % of total Hgb — ABNORMAL HIGH (ref <5.7)
Mean Plasma Glucose: 146 (calc)
eAG (mmol/L): 8.1 (calc)

## 2017-08-14 LAB — T4, FREE: Free T4: 1.5 ng/dL (ref 0.8–1.8)

## 2017-08-14 NOTE — Progress Notes (Signed)
Remote pacemaker transmission.   

## 2017-08-15 ENCOUNTER — Encounter: Payer: Self-pay | Admitting: Cardiology

## 2017-08-16 ENCOUNTER — Other Ambulatory Visit: Payer: Self-pay | Admitting: Family Medicine

## 2017-08-20 ENCOUNTER — Other Ambulatory Visit: Payer: Self-pay | Admitting: *Deleted

## 2017-08-20 MED ORDER — OXYCODONE-ACETAMINOPHEN 7.5-325 MG PO TABS: 1.0000 | ORAL_TABLET | Freq: Three times a day (TID) | ORAL | 0 refills | Status: DC | PRN

## 2017-08-20 NOTE — Telephone Encounter (Signed)
Due for OV

## 2017-08-20 NOTE — Telephone Encounter (Signed)
Call placed to patient and patient made aware.   Appointment scheduled.  

## 2017-08-20 NOTE — Telephone Encounter (Signed)
Received call from patient.   Requested refill on Oxycodone/ APAP.   Ok to refill??  Last office visit 01/31/2017.  Last refill 07/23/2017.

## 2017-08-21 ENCOUNTER — Encounter: Payer: Self-pay | Admitting: "Endocrinology

## 2017-08-21 ENCOUNTER — Ambulatory Visit (INDEPENDENT_AMBULATORY_CARE_PROVIDER_SITE_OTHER): Payer: Medicare Other | Admitting: "Endocrinology

## 2017-08-21 VITALS — BP 151/82 | HR 71 | Ht 72.0 in | Wt 323.0 lb

## 2017-08-21 DIAGNOSIS — I10 Essential (primary) hypertension: Secondary | ICD-10-CM

## 2017-08-21 DIAGNOSIS — E1169 Type 2 diabetes mellitus with other specified complication: Secondary | ICD-10-CM

## 2017-08-21 DIAGNOSIS — E559 Vitamin D deficiency, unspecified: Secondary | ICD-10-CM | POA: Diagnosis not present

## 2017-08-21 DIAGNOSIS — E890 Postprocedural hypothyroidism: Secondary | ICD-10-CM

## 2017-08-21 DIAGNOSIS — E669 Obesity, unspecified: Secondary | ICD-10-CM

## 2017-08-21 DIAGNOSIS — E782 Mixed hyperlipidemia: Secondary | ICD-10-CM

## 2017-08-21 MED ORDER — METFORMIN HCL 500 MG PO TABS: 500.0000 mg | ORAL_TABLET | Freq: Two times a day (BID) | ORAL | 6 refills | Status: DC

## 2017-08-21 MED ORDER — VITAMIN D3 125 MCG (5000 UT) PO CAPS: 5000.0000 [IU] | ORAL_CAPSULE | Freq: Every day | ORAL | 0 refills | Status: DC

## 2017-08-21 NOTE — Progress Notes (Signed)
Subjective:    Patient ID: Peter Dunlap, male    DOB: 1955-06-07, PCP [name], MD   Past Medical History:  Diagnosis Date  . Arteriosclerotic cardiovascular disease (ASCVD) 2005   catheterization in 10/2010:50% mid LAD, diffuse distal disease, circumflex irregularities, large dominant RCA with a 50% ostial, 70% distal, 60% posterolateral and 70% PDA; normal EF  . Arthritis   . Benign prostatic hypertrophy   . Cerebrovascular disease 2010   R. carotid endarterectomy; Duplex in 10/2010-widely patent ICAs, subtotal left vertebral-not thought to be contributing to symptoms  . Cervical spine disease    CT in 2012-advanced degeneration and spondylosis with moderate spinal stenosis at C3-C6  . Depression   . Erectile dysfunction   . Gastroesophageal reflux disease   . H/O hiatal hernia   . H/O: substance abuse    Cocaine, marijuana, alcohol.  Quit 2013.   [name] Hyperlipidemia   . Hypertension   . Non-ST elevation myocardial infarction (NSTEMI), initial episode of care [facility]) 12/02/2013   DES LAD  . Obesity   . Prostate cancer ([place])   . Sleep apnea    CPAP  . Tachy-brady syndrome ([place])    a. s/p STJ dual chamber PPM   . Thyroid disease   . Tobacco abuse    Quit 2014   Past Surgical History:  Procedure Laterality Date  . BRAIN SURGERY  2015   hematoma evacuation  . BURR HOLE Right 04/13/2014   Procedure: Haskell Flirt;  Surgeon: Charlie Pitter, MD;  Location: [facility]URO ORS;  Service: Neurosurgery;  Laterality: Right;  . CAROTID ENDARTERECTOMY Right Feb. 25, 2010    CEA  . CORONARY ANGIOPLASTY WITH STENT PLACEMENT  12/03/2013   LAD 90%-->0% W/ Promus Premier DES 3.0 mm x 16 mm, CFX OK, RCA 40%, EF 70-75%  . LEFT ATRIAL APPENDAGE OCCLUSION N/A 08/05/2015   Procedure: LEFT ATRIAL APPENDAGE OCCLUSION;  Surgeon: [name], MD;  Location: [facility] CV LAB;  Service: Cardiovascular;  Laterality: N/A;  . LEFT HEART CATHETERIZATION WITH CORONARY ANGIOGRAM Left 12/03/2013    Procedure: LEFT HEART CATHETERIZATION WITH CORONARY ANGIOGRAM;  Surgeon: Leonie Man, MD;  Location: [facility] CATH LAB;  Service: Cardiovascular;  Laterality: Left;  . LEFT HEART CATHETERIZATION WITH CORONARY ANGIOGRAM N/A 01/26/2014   Procedure: LEFT HEART CATHETERIZATION WITH CORONARY ANGIOGRAM;  Surgeon: Jettie Booze, MD;  Location: [facility] CATH LAB;  Service: Cardiovascular;  Laterality: N/A;  . LEFT HEART CATHETERIZATION WITH CORONARY ANGIOGRAM N/A 08/03/2014   Procedure: LEFT HEART CATHETERIZATION WITH CORONARY ANGIOGRAM;  Surgeon: Burnell Blanks, MD;  Location: [facility] CATH LAB;  Service: Cardiovascular;  Laterality: N/A;  . PERCUTANEOUS CORONARY STENT INTERVENTION (PCI-S)  12/03/2013   Procedure: PERCUTANEOUS CORONARY STENT INTERVENTION (PCI-S);  Surgeon: Leonie Man, MD;  Location: [facility] CATH LAB;  Service: Cardiovascular;;  . PERMANENT PACEMAKER INSERTION N/A 09/18/2014   Procedure: PERMANENT PACEMAKER INSERTION;  Surgeon: [name], MD;  Location: [facility] CATH LAB;  Service: Cardiovascular;  Laterality: N/A;  . RADIOFREQUENCY ABLATION  2005   for PSVT  . TEE WITHOUT CARDIOVERSION N/A 07/27/2015   Procedure: TRANSESOPHAGEAL ECHOCARDIOGRAM (TEE);  Surgeon: Lelon Perla, MD;  Location: [facility] ENDOSCOPY;  Service: Cardiovascular;  Laterality: N/A;  . TEE WITHOUT CARDIOVERSION N/A 09/15/2015   Procedure: TRANSESOPHAGEAL ECHOCARDIOGRAM (TEE);  Surgeon: Thayer Headings, MD;  Location: [facility] ENDOSCOPY;  Service: Cardiovascular;  Laterality: N/A;   Social History   Socioeconomic History  . Marital status: Married  Spouse name: None  . Number of children: 0  . Years of education: None  . Highest education level: None  Social Needs  . Financial resource strain: None  . Food insecurity - worry: None  . Food insecurity - inability: None  . Transportation needs - medical: None  . Transportation needs - non-medical: None  Occupational History  . Occupation: Retired  Tobacco Use  . Smoking  status: Former Smoker    Packs/day: 1.00    Years: 40.00    Pack years: 40.00    Types: Cigarettes    Start date: 10/20/1972    Last attempt to quit: 10/10/2012    Years since quitting: 4.8  . Smokeless tobacco: Never Used  . Tobacco comment: Quit in May.   Substance and Sexual Activity  . Alcohol use: No    Alcohol/week: 0.0 oz    Comment: former drinker-- sober since 2013.   . Drug use: No    Comment: quit cocaine 10/2011  . Sexual activity: Yes    Partners: Female  Other Topics Concern  . None  Social History Narrative   Lives in [place].   Outpatient Encounter Medications as of 08/21/2017  Medication Sig  . allopurinol (ZYLOPRIM) 100 MG tablet TAKE 1 TABLET BY MOUTH EVERY DAY  . aspirin 81 MG chewable tablet Chew 81 mg by mouth daily.   [name] aspirin EC 325 MG tablet Take 1 tablet (325 mg total) daily by mouth.  . Cholecalciferol (VITAMIN D3) 5000 units CAPS Take 1 capsule (5,000 Units total) by mouth daily.  . colchicine 0.6 MG tablet Take 1 tablet (0.6 mg total) by mouth daily.  [name] DEXILANT 60 MG capsule TAKE 1 CAPSULE BY MOUTH EVERY DAY  . furosemide (LASIX) 20 MG tablet TAKE 3 TABLETS BY MOUTH DAILY  . isosorbide mononitrate (IMDUR) 60 MG 24 hr tablet TAKE 1 TABLET(60 MG) BY MOUTH DAILY  . levothyroxine (SYNTHROID, LEVOTHROID) 200 MCG tablet Take 1 tablet (200 mcg total) by mouth daily before breakfast.  . magnesium oxide (MAG-OX) 400 (241.3 Mg) MG tablet TAKE 1 TABLET BY MOUTH TWICE DAILY  . metFORMIN (GLUCOPHAGE) 500 MG tablet Take 1 tablet (500 mg total) by mouth 2 (two) times daily with a meal.  . nitroGLYCERIN (NITROSTAT) 0.4 MG SL tablet PLACE 1 TABLET UNDER THE TONGUE EVERY 5 MINUTES AS NEEDED FOR CHEST PAIN. CALL 911 AT THIRD DOSE IN 15 MINUTES  . ONE TOUCH ULTRA TEST test strip USE TO TEST ONCE DAILY  . oxyCODONE-acetaminophen (PERCOCET) 7.5-325 MG tablet Take 1 tablet by mouth every 8 (eight) hours as needed for severe pain.  . potassium chloride SA (K-DUR,KLOR-CON)  20 MEQ tablet TAKE 1 TABLET BY MOUTH EVERY DAY  . RAPAFLO 8 MG CAPS capsule TAKE 1 CAPSULE(8 MG) BY MOUTH DAILY  . simvastatin (ZOCOR) 40 MG tablet TAKE 1 TABLET(40 MG) BY MOUTH DAILY  . sotalol (BETAPACE) 160 MG tablet TAKE 1 TABLET(160 MG) BY MOUTH TWICE DAILY  . traZODone (DESYREL) 50 MG tablet TAKE 1/2 TO 1 TABLET BY MOUTH AT BEDTIME AS NEEDED FOR SLEEP  . [DISCONTINUED] DEXILANT 60 MG capsule TAKE 1 CAPSULE BY MOUTH EVERY DAY  . [DISCONTINUED] DEXILANT 60 MG capsule TAKE 1 CAPSULE BY MOUTH EVERY DAY  . [DISCONTINUED] metFORMIN (GLUCOPHAGE) 500 MG tablet Take 1 tablet (500 mg total) by mouth daily with breakfast.  . [DISCONTINUED] metFORMIN (GLUCOPHAGE) 500 MG tablet TAKE 1 TABLET(500 MG) BY MOUTH TWICE DAILY WITH A MEAL   No facility-administered encounter medications on  file as of 08/21/2017.    ALLERGIES: Allergies  Allergen Reactions  . Lactose Intolerance (Gi) Other (See Comments)    UPSET STOMACH    VACCINATION STATUS: Immunization History  Administered Date(s) Administered  . Influenza,inj,Quad PF,6+ Mos 02/17/2014, 03/09/2015, 03/10/2016, 03/27/2017  . Pneumococcal Polysaccharide-23 01/05/2014  . Tdap 12/19/2010    HPI  Mr. Ginsberg is a 62 - yr-old male patient with medical history as above.  -He is returning for follow-up of his RAI induced hypothyroidism, type 2 diabetes, hyperlipidemia, hypertension. He was given RAI therapy on March 16, 2014  . He is now more consistent  taking his levothyroxine. He is on levothyroxine 200 g by mouth every morning.  He feels better. -He denies cold intolerance, constipation, fatigue.  He continues to gain weight. - He also has controlled type 2 diabetes on metformin. His A1c is higher at 6.7%, increasing from 5.5%.  He is taking metformin 500 mg p.o. daily.  He admits to dietary indiscretion.    Pt denies family history of thyroid dysfunction . He denies personal history of goiter. he has hx of a-fib, and CAD which required stent  placement x 2.   Review of Systems  Constitutional: + Weight gain, no subjective hyperthermia nor hypothermia.  Eyes: no blurry vision, no xerophthalmia ENT: no sore throat, no nodules palpated in throat, no dysphagia/odynophagia, no hoarseness Cardiovascular: No chest pain, no palpitations, no leg swelling.  Respiratory: No cough, no shortness of breath.   Gastrointestinal: no N/V/D/C Musculoskeletal:  complains of right knee pain and left foot pain from gout.  Skin: no rashes Neurological: No tremors, no numbness, no tingling.   Psychiatric: no depression/anxiety  Objective:    BP (!) 151/82   Pulse 71   Ht 6' (1.829 m)   Wt (!) 323 lb (146.5 kg)   BMI 43.81 kg/m   Wt Readings from Last 3 Encounters:  08/21/17 (!) 323 lb (146.5 kg)  05/16/17 (!) 319 lb 3.2 oz (144.8 kg)  04/13/17 (!) 312 lb 1.9 oz (141.6 kg)    Physical Exam Constitutional:   + Obese, not in acute distress.  Normal state of mind.  Eyes: PERRLA, EOMI, no exophthalmos ENT: Moist mucous membranes, no thyromegaly.   Musculoskeletal:  strength intact in all 4 Skin: moist, warm, no rashes Neurological: no tremor with outstretched hands, DTR normal in all 4   CMP     Component Value Date/Time   NA 138 04/13/2017 0906   NA 137 04/17/2016 1153   K 4.2 04/13/2017 0906   K 4.4 04/17/2016 1153   CL 99 04/13/2017 0906   CO2 23 04/13/2017 0906   CO2 22 04/17/2016 1153   GLUCOSE 117 (H) 04/13/2017 0906   GLUCOSE 115 (H) 01/31/2017 0921   GLUCOSE 115 04/17/2016 1153   BUN 10 04/13/2017 0906   BUN 13.6 04/17/2016 1153   CREATININE 1.19 04/13/2017 0906   CREATININE 1.20 01/31/2017 0921   CREATININE 1.2 04/17/2016 1153   CALCIUM 9.3 04/13/2017 0906   CALCIUM 9.6 04/17/2016 1153   PROT 6.9 08/14/2016 0808   PROT 7.9 04/17/2016 1153   ALBUMIN 3.7 08/14/2016 0808   ALBUMIN 3.2 (L) 04/17/2016 1153   AST 22 08/14/2016 0808   AST 20 04/17/2016 1153   ALT 12 08/14/2016 0808   ALT 16 04/17/2016 1153   ALKPHOS  78 08/14/2016 0808   ALKPHOS 104 04/17/2016 1153   BILITOT 1.1 08/14/2016 0808   BILITOT 1.27 (H) 04/17/2016 1153   GFRNONAA 66 04/13/2017  [place] 02/22/2016 0811   GFRAA 76 04/13/2017 0906   GFRAA 71 02/22/2016 0811     Diabetic Labs (most recent): Lab Results  Component Value Date   HGBA1C 6.7 (H) 08/13/2017   HGBA1C 5.5 08/14/2016   HGBA1C 5.6 06/19/2016     Lipid Panel ( most recent) Lipid Panel     Component Value Date/Time   CHOL 126 10/17/2016 0819   TRIG 138 10/17/2016 0819   HDL 35 (L) 10/17/2016 0819   CHOLHDL 3.6 10/17/2016 0819   VLDL 28 10/17/2016 0819   LDLCALC 63 10/17/2016 0819   Results for LEGRAND, LASSER (MRN [id number]) as of 08/21/2017 10:06  Ref. Range 08/13/2017 08:29  TSH Latest Ref Range: 0.40 - 4.50 mIU/L 1.71  T4,Free(Direct) Latest Ref Range: 0.8 - 1.8 ng/dL 1.5    Assessment & Plan:   1. 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  -I advised him to continue levothyroxine 200 mcg p.o. every morning.  - We discussed about correct intake of levothyroxine, at fasting, with water, separated by at least 30 minutes from breakfast, and separated by more than 4 hours from calcium, iron, multivitamins, acid reflux medications (PPIs). -Patient is made aware of the fact that thyroid hormone replacement is needed for life, dose to be adjusted by periodic monitoring of thyroid function tests.   2. Diabetes mellitus type 2 in obese (HCC) -A1c increasing to 6.7% still consistent with controlled diabetes.  I have advised him to increase metformin to 500 mg p.o. twice daily.   See #4 below. 3. Essential hypertension -His blood pressure is not controlled to target at 151/82.  He is advised to be consistent in taking his blood pressure medications including beta-blocker, Lasix.  4.  Hyperlipidemia: Controlled with LDL at 63. -He is advised to continue with simvastatin 40 mg p.o.  nightly.   5. Obesity: he continues to gain weight.   -  Suggestion is made for him to avoid simple carbohydrates  from his diet including Cakes, Sweet Desserts / Pastries, Ice Cream, Soda (diet and regular), Sweet Tea, Candies, Chips, Cookies, Store Bought Juices, Alcohol in Excess of  1-2 drinks a day, Artificial Sweeteners, and "Sugar-free" Products. This will help patient to have stable blood glucose profile and potentially avoid unintended weight gain.   - I advised patient to maintain close follow up with [name], MD for primary care needs.  - Time spent with the patient: 25 min, of which >50% was spent in reviewing his  current and  previous labs, previous treatments, and medications doses and developing a plan for long-term care for type 2 diabetes, hypothyroidism, hypertension, hyperlipidemia.  Peter Dunlap participated in the discussions, expressed understanding, and voiced agreement with the above plans.  All questions were answered to his satisfaction. he is encouraged to contact clinic should he have any questions or concerns prior to his return visit.  Follow up plan: Return in about 6 months (around 02/21/2018) for follow up with pre-visit labs.  Glade Lloyd, MD Phone: [phone]  Fax: [phone]  This note was partially dictated with voice recognition software. Similar sounding words can be transcribed inadequately or may not  be corrected upon review.  08/21/2017, 8:38 AM

## 2017-08-21 NOTE — Patient Instructions (Signed)

## 2017-08-27 ENCOUNTER — Encounter: Payer: Self-pay | Admitting: Family Medicine

## 2017-08-30 ENCOUNTER — Other Ambulatory Visit: Payer: Self-pay | Admitting: Family Medicine

## 2017-08-30 ENCOUNTER — Other Ambulatory Visit: Payer: Self-pay | Admitting: Nurse Practitioner

## 2017-08-30 NOTE — Telephone Encounter (Signed)
Pt's pharmacy is requesting a refill on magnesium oxide. Would Dr. Lovena Le like to refill this medication? Please advise

## 2017-09-02 LAB — CUP PACEART REMOTE DEVICE CHECK
Battery Remaining Longevity: 119 mo
Battery Remaining Percentage: 95.5 %
Battery Voltage: 2.98 V
Brady Statistic AP VP Percent: 1 %
Brady Statistic AP VS Percent: 11 %
Brady Statistic AS VP Percent: 2.1 %
Brady Statistic AS VS Percent: 86 %
Brady Statistic RA Percent Paced: 3 %
Brady Statistic RV Percent Paced: 41 %
Date Time Interrogation Session: 20190312063611
Implantable Lead Implant Date: 20160415
Implantable Lead Implant Date: 20160415
Implantable Lead Location: 753859
Implantable Lead Location: 753860
Implantable Pulse Generator Implant Date: 20160415
Lead Channel Impedance Value: 430 Ohm
Lead Channel Impedance Value: 560 Ohm
Lead Channel Pacing Threshold Amplitude: 0.75 V
Lead Channel Pacing Threshold Amplitude: 1 V
Lead Channel Pacing Threshold Pulse Width: 0.5 ms
Lead Channel Pacing Threshold Pulse Width: 0.5 ms
Lead Channel Sensing Intrinsic Amplitude: 12 mV
Lead Channel Sensing Intrinsic Amplitude: 4.9 mV
Lead Channel Setting Pacing Amplitude: 2 V
Lead Channel Setting Pacing Amplitude: 2.5 V
Lead Channel Setting Pacing Pulse Width: 0.5 ms
Lead Channel Setting Sensing Sensitivity: 2 mV
Pulse Gen Model: 2240
Pulse Gen Serial Number: 7756161

## 2017-09-03 ENCOUNTER — Ambulatory Visit (INDEPENDENT_AMBULATORY_CARE_PROVIDER_SITE_OTHER): Payer: Medicare Other | Admitting: Family Medicine

## 2017-09-03 ENCOUNTER — Encounter: Payer: Self-pay | Admitting: Family Medicine

## 2017-09-03 ENCOUNTER — Other Ambulatory Visit: Payer: Self-pay

## 2017-09-03 VITALS — BP 148/82 | HR 74 | Temp 98.4°F | Resp 14 | Ht 72.0 in | Wt 320.0 lb

## 2017-09-03 DIAGNOSIS — J302 Other seasonal allergic rhinitis: Secondary | ICD-10-CM | POA: Diagnosis not present

## 2017-09-03 DIAGNOSIS — I480 Paroxysmal atrial fibrillation: Secondary | ICD-10-CM

## 2017-09-03 DIAGNOSIS — I2510 Atherosclerotic heart disease of native coronary artery without angina pectoris: Secondary | ICD-10-CM | POA: Diagnosis not present

## 2017-09-03 DIAGNOSIS — E782 Mixed hyperlipidemia: Secondary | ICD-10-CM | POA: Diagnosis not present

## 2017-09-03 DIAGNOSIS — C61 Malignant neoplasm of prostate: Secondary | ICD-10-CM

## 2017-09-03 DIAGNOSIS — E1169 Type 2 diabetes mellitus with other specified complication: Secondary | ICD-10-CM

## 2017-09-03 DIAGNOSIS — G894 Chronic pain syndrome: Secondary | ICD-10-CM

## 2017-09-03 DIAGNOSIS — E669 Obesity, unspecified: Secondary | ICD-10-CM | POA: Diagnosis not present

## 2017-09-03 DIAGNOSIS — E119 Type 2 diabetes mellitus without complications: Secondary | ICD-10-CM

## 2017-09-03 LAB — COMPREHENSIVE METABOLIC PANEL
AG Ratio: 1.3 (calc) (ref 1.0–2.5)
ALT: 12 U/L (ref 9–46)
AST: 23 U/L (ref 10–35)
Albumin: 3.9 g/dL (ref 3.6–5.1)
Alkaline phosphatase (APISO): 74 U/L (ref 40–115)
BUN: 12 mg/dL (ref 7–25)
CO2: 28 mmol/L (ref 20–32)
Calcium: 9 mg/dL (ref 8.6–10.3)
Chloride: 104 mmol/L (ref 98–110)
Creat: 1.22 mg/dL (ref 0.70–1.25)
Globulin: 3 g/dL (calc) (ref 1.9–3.7)
Glucose, Bld: 102 mg/dL — ABNORMAL HIGH (ref 65–99)
Potassium: 4.3 mmol/L (ref 3.5–5.3)
Sodium: 140 mmol/L (ref 135–146)
Total Bilirubin: 1 mg/dL (ref 0.2–1.2)
Total Protein: 6.9 g/dL (ref 6.1–8.1)

## 2017-09-03 LAB — EXTRA LAV TOP TUBE

## 2017-09-03 LAB — LIPID PANEL
Cholesterol: 106 mg/dL (ref <200)
HDL: 29 mg/dL — ABNORMAL LOW (ref >40)
LDL Cholesterol (Calc): 55 mg/dL (calc)
Non-HDL Cholesterol (Calc): 77 mg/dL (calc) (ref <130)
Total CHOL/HDL Ratio: 3.7 (calc) (ref <5.0)
Triglycerides: 132 mg/dL (ref <150)

## 2017-09-03 MED ORDER — OXYCODONE-ACETAMINOPHEN 10-325 MG PO TABS: 1.0000 | ORAL_TABLET | Freq: Three times a day (TID) | ORAL | 0 refills | Status: DC | PRN

## 2017-09-03 NOTE — Patient Instructions (Signed)
F/U 4 months for Physical  

## 2017-09-03 NOTE — Assessment & Plan Note (Signed)
Check statin drug, f/u cardiology for A fib and CAD, no recent symptoms, no recent hospitlizations

## 2017-09-03 NOTE — Assessment & Plan Note (Signed)
As he is non surgical candidate for knees and back , chronic pain control is left Increase percocet for April Fill date, to 10-325mg  #60 tablets

## 2017-09-03 NOTE — Assessment & Plan Note (Signed)
Followed by urology, on rapaflo

## 2017-09-03 NOTE — Progress Notes (Signed)
   Subjective:    Patient ID: Peter Dunlap, male    DOB: Mar 19, 1956, 62 y.o.   MRN: [id number]  Patient presents for Follow-up (is not fasting)  He has gained8 lbs since August, now moving around more, states he was in the house just eating during the day    Cardiology- PAF on sotataol, has watchman device and has Pacemaker, no concerns    Dr. Gershon Crane- removed cyst from both eyelids, vision is good   Chronic back pain/ chronic knee pain- taking percocet, states it does not help as much . Not a surgical candidate   Itchy eyes, sneezing from pollen not sure what to take.    Urology- appt on the 19th , for his prostate cancer     Review Of Systems:  GEN- denies fatigue, fever, weight loss,weakness, recent illness HEENT- denies eye drainage, change in vision, nasal discharge, CVS- denies chest pain, palpitations RESP- denies SOB, cough, wheeze ABD- denies N/V, change in stools, abd pain GU- denies dysuria, hematuria, dribbling, incontinence MSK- denies joint pain, muscle aches, injury Neuro- denies headache, dizziness, syncope, seizure activity       Objective:    BP (!) 148/82   Pulse 74   Temp 98.4 F (36.9 C) (Oral)   Resp 14   Ht 6' (1.829 m)   Wt (!) 320 lb (145.2 kg)   SpO2 96%   BMI 43.40 kg/m  GEN- NAD, alert and oriented x3 HEENT- PERRL, EOMI, non injected sclera, pink conjunctiva, MMM, oropharynx clear Neck- Supple, no thyromegaly CVS- RRR, no murmur RESP-CTAB ABD-NABS,soft,NT,ND EXT- No edema Pulses- Radial 2+        Assessment & Plan:      Problem List Items Addressed This Visit      Unprioritized   Paroxysmal atrial fibrillation (HCC)   Diabetes mellitus type 2 in obese (HCC) - Primary (Chronic)   Relevant Orders   Comprehensive metabolic panel   CAD (coronary artery disease)   Prostate cancer ([place])    Followed by urology, on rapaflo      Morbid obesity ([place])    reitearted avoiding heavy salty foods, fast food, soda, he is gaining  weight, causing worsening back pain, and stress on heart      Hyperlipidemia (Chronic)    Check statin drug, f/u cardiology for A fib and CAD, no recent symptoms, no recent hospitlizations      Relevant Orders   Comprehensive metabolic panel   Lipid panel   Chronic pain syndrome    As he is non surgical candidate for knees and back , chronic pain control is left Increase percocet for April Fill date, to 10-325mg  #60 tablets      Relevant Orders   Drugs of abuse screen w/o alc, rtn urine-sln    Other Visit Diagnoses    Seasonal allergies       He can use claritin OTC      Note: This dictation was prepared with Dragon dictation along with smaller phrase technology. Any transcriptional errors that result from this process are unintentional.

## 2017-09-03 NOTE — Assessment & Plan Note (Signed)
reitearted avoiding heavy salty foods, fast food, soda, he is gaining weight, causing worsening back pain, and stress on heart

## 2017-09-10 DIAGNOSIS — C61 Malignant neoplasm of prostate: Secondary | ICD-10-CM | POA: Diagnosis not present

## 2017-09-10 LAB — DRUGS OF ABUSE SCREEN W/O ALC, ROUTINE URINE
AMPHETAMINES (1000 ng/mL SCRN): NEGATIVE
BARBITURATES: NEGATIVE
BENZODIAZEPINES: NEGATIVE
COCAINE METABOLITES: NEGATIVE
MARIJUANA MET (50 ng/mL SCRN): NEGATIVE
METHADONE: NEGATIVE
METHAQUALONE: NEGATIVE
OPIATES: NEGATIVE
PHENCYCLIDINE: NEGATIVE
PROPOXYPHENE: NEGATIVE

## 2017-09-15 ENCOUNTER — Other Ambulatory Visit: Payer: Self-pay | Admitting: Internal Medicine

## 2017-09-18 ENCOUNTER — Other Ambulatory Visit: Payer: Self-pay | Admitting: *Deleted

## 2017-09-18 ENCOUNTER — Ambulatory Visit (INDEPENDENT_AMBULATORY_CARE_PROVIDER_SITE_OTHER): Payer: Medicare Other | Admitting: Urology

## 2017-09-18 DIAGNOSIS — N401 Enlarged prostate with lower urinary tract symptoms: Secondary | ICD-10-CM

## 2017-09-18 DIAGNOSIS — R351 Nocturia: Secondary | ICD-10-CM | POA: Diagnosis not present

## 2017-09-18 DIAGNOSIS — C61 Malignant neoplasm of prostate: Secondary | ICD-10-CM | POA: Diagnosis not present

## 2017-09-18 MED ORDER — OXYCODONE-ACETAMINOPHEN 10-325 MG PO TABS: 1.0000 | ORAL_TABLET | Freq: Three times a day (TID) | ORAL | 0 refills | Status: DC | PRN

## 2017-09-18 NOTE — Telephone Encounter (Signed)
Received call from patient.   Requested refill on Oxycodone/APAP.   Ok to refill??  Last office visit 09/03/2017.  Last refill 08/20/2017.

## 2017-09-28 ENCOUNTER — Other Ambulatory Visit: Payer: Self-pay | Admitting: Family Medicine

## 2017-09-29 ENCOUNTER — Other Ambulatory Visit: Payer: Self-pay | Admitting: "Endocrinology

## 2017-09-29 ENCOUNTER — Other Ambulatory Visit: Payer: Self-pay | Admitting: Internal Medicine

## 2017-09-29 ENCOUNTER — Other Ambulatory Visit: Payer: Self-pay | Admitting: Family Medicine

## 2017-10-01 NOTE — Telephone Encounter (Signed)
Okay to refill? Please advise. Thanks, MI 

## 2017-10-17 ENCOUNTER — Other Ambulatory Visit: Payer: Self-pay | Admitting: *Deleted

## 2017-10-17 MED ORDER — OXYCODONE-ACETAMINOPHEN 10-325 MG PO TABS: 1.0000 | ORAL_TABLET | Freq: Three times a day (TID) | ORAL | 0 refills | Status: DC | PRN

## 2017-10-17 NOTE — Telephone Encounter (Signed)
Received call from patient.   Requested refill on Oxycodone/APAP.   Ok to refill??  Last office visit 09/03/2017.  Last refill 09/18/2017.

## 2017-10-25 ENCOUNTER — Other Ambulatory Visit: Payer: Self-pay | Admitting: Family Medicine

## 2017-10-29 ENCOUNTER — Other Ambulatory Visit: Payer: Self-pay | Admitting: Internal Medicine

## 2017-10-30 DIAGNOSIS — G4733 Obstructive sleep apnea (adult) (pediatric): Secondary | ICD-10-CM | POA: Diagnosis not present

## 2017-10-30 NOTE — Telephone Encounter (Signed)
Pt's pharmacy is requesting a refill on magnesium oxide. Would Dr. Taylor like to refill this medication? Please address 

## 2017-11-13 ENCOUNTER — Ambulatory Visit (INDEPENDENT_AMBULATORY_CARE_PROVIDER_SITE_OTHER): Payer: Medicare Other | Admitting: *Deleted

## 2017-11-13 DIAGNOSIS — I495 Sick sinus syndrome: Secondary | ICD-10-CM

## 2017-11-13 NOTE — Progress Notes (Signed)
Remote pacemaker transmission.   

## 2017-11-14 ENCOUNTER — Other Ambulatory Visit: Payer: Self-pay | Admitting: Family Medicine

## 2017-11-14 ENCOUNTER — Encounter: Payer: Self-pay | Admitting: Cardiology

## 2017-11-15 ENCOUNTER — Other Ambulatory Visit: Payer: Self-pay | Admitting: *Deleted

## 2017-11-15 NOTE — Telephone Encounter (Signed)
Received call from patient.   Requested refill on Oxycodone/APAP.   Ok to refill??  Last office visit 09/03/2017.  Last refill 10/17/2017.

## 2017-11-16 MED ORDER — OXYCODONE-ACETAMINOPHEN 10-325 MG PO TABS: 1.0000 | ORAL_TABLET | Freq: Three times a day (TID) | ORAL | 0 refills | Status: DC | PRN

## 2017-11-28 ENCOUNTER — Other Ambulatory Visit: Payer: Self-pay | Admitting: Family Medicine

## 2017-12-06 LAB — CUP PACEART REMOTE DEVICE CHECK
Battery Remaining Longevity: 119 mo
Battery Remaining Percentage: 95.5 %
Battery Voltage: 2.99 V
Brady Statistic AP VP Percent: 1 %
Brady Statistic AP VS Percent: 9.2 %
Brady Statistic AS VP Percent: 2.3 %
Brady Statistic AS VS Percent: 88 %
Brady Statistic RA Percent Paced: 2.4 %
Brady Statistic RV Percent Paced: 40 %
Date Time Interrogation Session: 20190611095141
Implantable Lead Implant Date: 20160415
Implantable Lead Implant Date: 20160415
Implantable Lead Location: 753859
Implantable Lead Location: 753860
Implantable Pulse Generator Implant Date: 20160415
Lead Channel Impedance Value: 430 Ohm
Lead Channel Impedance Value: 560 Ohm
Lead Channel Pacing Threshold Amplitude: 0.75 V
Lead Channel Pacing Threshold Amplitude: 1 V
Lead Channel Pacing Threshold Pulse Width: 0.5 ms
Lead Channel Pacing Threshold Pulse Width: 0.5 ms
Lead Channel Sensing Intrinsic Amplitude: 12 mV
Lead Channel Sensing Intrinsic Amplitude: 5 mV
Lead Channel Setting Pacing Amplitude: 2 V
Lead Channel Setting Pacing Amplitude: 2.5 V
Lead Channel Setting Pacing Pulse Width: 0.5 ms
Lead Channel Setting Sensing Sensitivity: 2 mV
Pulse Gen Model: 2240
Pulse Gen Serial Number: 7756161

## 2017-12-07 ENCOUNTER — Encounter (HOSPITAL_COMMUNITY): Payer: Medicare Other

## 2017-12-07 ENCOUNTER — Other Ambulatory Visit: Payer: Self-pay

## 2017-12-07 ENCOUNTER — Ambulatory Visit: Payer: Medicare Other | Admitting: Family

## 2017-12-07 DIAGNOSIS — I6521 Occlusion and stenosis of right carotid artery: Secondary | ICD-10-CM

## 2017-12-07 DIAGNOSIS — Z9889 Other specified postprocedural states: Secondary | ICD-10-CM

## 2017-12-07 DIAGNOSIS — I6523 Occlusion and stenosis of bilateral carotid arteries: Secondary | ICD-10-CM

## 2017-12-17 ENCOUNTER — Other Ambulatory Visit: Payer: Self-pay | Admitting: Family Medicine

## 2017-12-17 MED ORDER — OXYCODONE-ACETAMINOPHEN 10-325 MG PO TABS: 1.0000 | ORAL_TABLET | Freq: Three times a day (TID) | ORAL | 0 refills | Status: DC | PRN

## 2017-12-17 NOTE — Telephone Encounter (Signed)
Patient asking refill on his oxycodone  To be sent to [facility]

## 2017-12-17 NOTE — Telephone Encounter (Signed)
Last OV 09/03/2017 Last refill 11/16/2017 Ok to refill?

## 2017-12-20 ENCOUNTER — Encounter: Payer: Self-pay | Admitting: Family

## 2017-12-20 ENCOUNTER — Ambulatory Visit (INDEPENDENT_AMBULATORY_CARE_PROVIDER_SITE_OTHER): Payer: Medicare Other | Admitting: Family

## 2017-12-20 ENCOUNTER — Ambulatory Visit (HOSPITAL_COMMUNITY)
Admission: RE | Admit: 2017-12-20 | Discharge: 2017-12-20 | Disposition: A | Discharge status: Home / Self Care | Payer: Medicare Other | Source: Ambulatory Visit | Attending: Vascular Surgery | Admitting: Vascular Surgery

## 2017-12-20 ENCOUNTER — Other Ambulatory Visit: Payer: Self-pay

## 2017-12-20 VITALS — BP 126/78 | HR 76 | Temp 97.8°F | Resp 18 | Ht 72.0 in | Wt 311.0 lb

## 2017-12-20 DIAGNOSIS — I6523 Occlusion and stenosis of bilateral carotid arteries: Secondary | ICD-10-CM

## 2017-12-20 DIAGNOSIS — I6521 Occlusion and stenosis of right carotid artery: Secondary | ICD-10-CM

## 2017-12-20 DIAGNOSIS — Z87891 Personal history of nicotine dependence: Secondary | ICD-10-CM

## 2017-12-20 DIAGNOSIS — I6522 Occlusion and stenosis of left carotid artery: Secondary | ICD-10-CM | POA: Diagnosis not present

## 2017-12-20 DIAGNOSIS — Z9889 Other specified postprocedural states: Secondary | ICD-10-CM | POA: Diagnosis not present

## 2017-12-20 NOTE — Progress Notes (Signed)
Chief Complaint: Follow up Extracranial Carotid Artery Stenosis   History of Present Illness  Peter Dunlap is a 62 y.o. male who is status post right CEA in February 2010 by Dr. Donnetta Hutching.   He denies any history of TIA or stroke symptoms. Specifically he denies a history of amaurosis fugax or monocular blindness, unilateral facial drooping, hemiplegia/hemiparesis, or receptive or expressive aphasia.  He denies numbness, tingling, cold sensation, or aching in either upper extremity.   He denies claudication type symptoms with walking, denies non healing wounds. He reports "bulging discs" in his low back, has occasional numbness at bilateral anterior thighs, worse when supine. States he has severe OA and needs a right knee replacement, but his cardiac status is prohibitive to this. He states gout issues in left foot intermittently.  Has rotator cuff issues in both shoulders.   He states that his heart rhythm remains irregular and that his cardiologist, Dr. Crissie Sickles, is aware. He had left atrial appendage occluded in March 2017, possibly a Watchman at that time.  Pt states he has had 2-3 MI's.  He has had 2 MI's, last one approximately 2015.  Had thyroid ablation March 2016 for hyperthyroidism, takes levothyroxine.   He was hospitalized at [facility] end of 2014 for CHF, states he has been stabilized lately.  He was walking more and intentionally losing weight, but has stopped this due to his back hurting him more recently.    He finished radiation tx for prostate cancer in January 2018.   Diabetic: yes, A1C was 6.7 on 08-13-17 (review of records), has 2 sisters with DM, he is taking metformin Tobacco use: former smoker, quit in 2014  Pt meds include: Statin : Yes ASA: Yes Other anticoagulants/antiplatelets: No. He was on coumadin for atrial fib and this was stopped after he had an intracranial bleed in 2015.   Past Medical History:  Diagnosis Date  .  Arteriosclerotic cardiovascular disease (ASCVD) 2005   catheterization in 10/2010:50% mid LAD, diffuse distal disease, circumflex irregularities, large dominant RCA with a 50% ostial, 70% distal, 60% posterolateral and 70% PDA; normal EF  . Arthritis   . Benign prostatic hypertrophy   . Cerebrovascular disease 2010   R. carotid endarterectomy; Duplex in 10/2010-widely patent ICAs, subtotal left vertebral-not thought to be contributing to symptoms  . Cervical spine disease    CT in 2012-advanced degeneration and spondylosis with moderate spinal stenosis at C3-C6  . Depression   . Erectile dysfunction   . Gastroesophageal reflux disease   . H/O hiatal hernia   . H/O: substance abuse    Cocaine, marijuana, alcohol.  Quit 2013.   [name] Hyperlipidemia   . Hypertension   . Non-ST elevation myocardial infarction (NSTEMI), initial episode of care [facility]) 12/02/2013   DES LAD  . Obesity   . Prostate cancer ([place])   . Sleep apnea    CPAP  . Tachy-brady syndrome ([place])    a. s/p STJ dual chamber PPM   . Thyroid disease   . Tobacco abuse    Quit 2014    Social History Social History   Tobacco Use  . Smoking status: Former Smoker    Packs/day: 1.00    Years: 40.00    Pack years: 40.00    Types: Cigarettes    Start date: 10/20/1972    Last attempt to quit: 10/10/2012    Years since quitting: 5.1  . Smokeless tobacco: Never Used  . Tobacco comment: Quit in May.   Substance  Use Topics  . Alcohol use: No    Alcohol/week: 0.0 oz    Comment: former drinker-- sober since 2013.   . Drug use: No    Types: Cocaine    Comment: quit cocaine 10/2011    Family History Family History  Problem Relation Age of Onset  . Hypertension Mother        Cerebrovascular disease  . Diabetes Mother   . Coronary artery disease Father 55  . Diabetes type II Father   . Hypertension Father   . Heart attack Father   . Diabetes Brother   . Hypertension Brother   . Lung cancer Paternal Uncle   . Diabetes Sister    . Hypertension Sister   . Heart attack Sister 33  . Cancer Sister        leukemia  . Cancer Maternal Uncle        breast  . Cancer Maternal Grandmother        breast    Surgical History Past Surgical History:  Procedure Laterality Date  . BRAIN SURGERY  2015   hematoma evacuation  . BURR HOLE Right 04/13/2014   Procedure: Haskell Flirt;  Surgeon: Charlie Pitter, MD;  Location: [place]URO ORS;  Service: Neurosurgery;  Laterality: Right;  . CAROTID ENDARTERECTOMY Right Feb. 25, 2010    CEA  . CORONARY ANGIOPLASTY WITH STENT PLACEMENT  12/03/2013   LAD 90%-->0% W/ Promus Premier DES 3.0 mm x 16 mm, CFX OK, RCA 40%, EF 70-75%  . LEFT ATRIAL APPENDAGE OCCLUSION N/A 08/05/2015   Procedure: LEFT ATRIAL APPENDAGE OCCLUSION;  Surgeon: [name], MD;  Location: [facility] CV LAB;  Service: Cardiovascular;  Laterality: N/A;  . LEFT HEART CATHETERIZATION WITH CORONARY ANGIOGRAM Left 12/03/2013   Procedure: LEFT HEART CATHETERIZATION WITH CORONARY ANGIOGRAM;  Surgeon: Leonie Man, MD;  Location: [facility] CATH LAB;  Service: Cardiovascular;  Laterality: Left;  . LEFT HEART CATHETERIZATION WITH CORONARY ANGIOGRAM N/A 01/26/2014   Procedure: LEFT HEART CATHETERIZATION WITH CORONARY ANGIOGRAM;  Surgeon: Jettie Booze, MD;  Location: [facility] CATH LAB;  Service: Cardiovascular;  Laterality: N/A;  . LEFT HEART CATHETERIZATION WITH CORONARY ANGIOGRAM N/A 08/03/2014   Procedure: LEFT HEART CATHETERIZATION WITH CORONARY ANGIOGRAM;  Surgeon: Burnell Blanks, MD;  Location: [facility] CATH LAB;  Service: Cardiovascular;  Laterality: N/A;  . PERCUTANEOUS CORONARY STENT INTERVENTION (PCI-S)  12/03/2013   Procedure: PERCUTANEOUS CORONARY STENT INTERVENTION (PCI-S);  Surgeon: Leonie Man, MD;  Location: [facility] CATH LAB;  Service: Cardiovascular;;  . PERMANENT PACEMAKER INSERTION N/A 09/18/2014   Procedure: PERMANENT PACEMAKER INSERTION;  Surgeon: [name], MD;  Location: [facility] CATH LAB;  Service: Cardiovascular;   Laterality: N/A;  . RADIOFREQUENCY ABLATION  2005   for PSVT  . TEE WITHOUT CARDIOVERSION N/A 07/27/2015   Procedure: TRANSESOPHAGEAL ECHOCARDIOGRAM (TEE);  Surgeon: Lelon Perla, MD;  Location: [facility] ENDOSCOPY;  Service: Cardiovascular;  Laterality: N/A;  . TEE WITHOUT CARDIOVERSION N/A 09/15/2015   Procedure: TRANSESOPHAGEAL ECHOCARDIOGRAM (TEE);  Surgeon: Thayer Headings, MD;  Location: [place];  Service: Cardiovascular;  Laterality: N/A;    Allergies  Allergen Reactions  . Lactose Intolerance (Gi) Other (See Comments)    UPSET STOMACH     Current Outpatient Medications  Medication Sig Dispense Refill  . allopurinol (ZYLOPRIM) 100 MG tablet TAKE 1 TABLET BY MOUTH EVERY DAY 90 tablet 0  . aspirin 81 MG chewable tablet Chew 81 mg by mouth daily.     [name] aspirin EC 325 MG tablet  Take 1 tablet (325 mg total) daily by mouth. 30 tablet 0  . Cholecalciferol (VITAMIN D3) 5000 units CAPS Take 1 capsule (5,000 Units total) by mouth daily. 90 capsule 0  . colchicine 0.6 MG tablet Take 1 tablet (0.6 mg total) by mouth daily. 15 tablet 0  . DEXILANT 60 MG capsule TAKE 1 CAPSULE BY MOUTH EVERY DAY 30 capsule 0  . DEXILANT 60 MG capsule TAKE 1 CAPSULE BY MOUTH EVERY DAY 30 capsule 0  . furosemide (LASIX) 20 MG tablet TAKE 3 TABLETS BY MOUTH DAILY 270 tablet 2  . isosorbide mononitrate (IMDUR) 60 MG 24 hr tablet TAKE 1 TABLET(60 MG) BY MOUTH DAILY 90 tablet 2  . levothyroxine (SYNTHROID, LEVOTHROID) 200 MCG tablet Take 1 tablet (200 mcg total) by mouth daily before breakfast. 30 tablet 6  . MAGNESIUM-OXIDE 400 (241.3 Mg) MG tablet TAKE 1 TABLET BY MOUTH TWICE DAILY 60 tablet 11  . metFORMIN (GLUCOPHAGE) 500 MG tablet Take 1 tablet (500 mg total) by mouth 2 (two) times daily with a meal. 60 tablet 6  . metFORMIN (GLUCOPHAGE) 500 MG tablet TAKE 1 TABLET(500 MG) BY MOUTH TWICE DAILY WITH A MEAL 60 tablet 2  . nitroGLYCERIN (NITROSTAT) 0.4 MG SL tablet PLACE 1 TABLET UNDER THE TONGUE EVERY 5 MINUTES  AS NEEDED FOR CHEST PAIN. CALL 911 AT THIRD DOSE IN 15 MINUTES 25 tablet 1  . ONE TOUCH ULTRA TEST test strip USE TO TEST ONCE DAILY 50 each 1  . oxyCODONE-acetaminophen (PERCOCET) 10-325 MG tablet Take 1 tablet by mouth every 8 (eight) hours as needed for pain. 60 tablet 0  . potassium chloride SA (K-DUR,KLOR-CON) 20 MEQ tablet TAKE 1 TABLET BY MOUTH EVERY DAY 90 tablet 3  . RAPAFLO 8 MG CAPS capsule TAKE 1 CAPSULE(8 MG) BY MOUTH DAILY 90 capsule 1  . RAPAFLO 8 MG CAPS capsule TAKE 1 CAPSULE(8 MG) BY MOUTH DAILY 30 capsule 0  . simvastatin (ZOCOR) 40 MG tablet TAKE 1 TABLET(40 MG) BY MOUTH DAILY 90 tablet 0  . sotalol (BETAPACE) 160 MG tablet TAKE 1 TABLET(160 MG) BY MOUTH TWICE DAILY 180 tablet 3  . traZODone (DESYREL) 50 MG tablet TAKE 1/2 TO 1 TABLET BY MOUTH AT BEDTIME AS NEEDED FOR SLEEP 30 tablet 3   No current facility-administered medications for this visit.     Review of Systems : See HPI for pertinent positives and negatives.  Physical Examination  Vitals:   12/20/17 1452 12/20/17 1456  BP: 120/71 126/78  Pulse: 72 76  Resp: 18   Temp: 97.8 F (36.6 C)   TempSrc: Oral   SpO2: 98%   Weight: (!) 311 lb (141.1 kg)   Height: 6' (1.829 m)    Body mass index is 42.18 kg/m.  General: WDWN morbidly obese male in NAD GAIT: normal Eyes: PERRLA HENT: No gross abnormalities.  Pulmonary:  Respirations are non-labored, distant breath sounds in all fields, CTAB, no rales, rhonchi, or wheezing. Cardiac: irregular rhythm, no detected murmur. Pacemaker palpated left upper chest.   VASCULAR EXAM Carotid Bruits Right Left   Negative Negative     Abdominal aortic pulse isnot palpable. Radial pulses are 2+ palpable and equal.  LE Pulses Right Left       POPLITEAL  not palpable   not palpable       POSTERIOR TIBIAL  2+ palpable   1+ palpable        DORSALIS  PEDIS      ANTERIOR TIBIAL not palpable  2+ palpable     Gastrointestinal: distended, normal bowel sounds, no palpable masses. Musculoskeletal: no muscle atrophy/wasting. M/S 5/5 throughout, extremities without ischemic changes. No peripheral edema.  Skin: No rashes, no ulcers, no cellulitis.   Neurologic:  A&O X 3; appropriate affect, sensation is normal; speech is normal, CN 2-12 intact, pain and light touch intact in extremities, motor exam as listed above. Psychiatric: Normal thought content, mood appropriate to clinical situation.    Assessment: KHAMARION BJELLAND is a 62 y.o. male who is status post right CEA in February 2010.  He has no history of stroke.  He does have a significant cardiac history. He has atrial fib, had to stop coumadin after he had an intracranial bleed in 2015.  He has a pacemaker and Watchman device.   He is morbidly obese.    DATA Carotid Duplex (12-20-17): Right ICA: CEA site with no stenosis Left ICA: 1-39% stenosis Right vertebral artery flow is antegrade, left is not visualized.  Bilateral subclavian artery waveforms are normal.  No significant change compared to the exams on 11-25-15 and 11-30-16.    Plan:  Follow-up in 18 months with Carotid Duplex scan.    I discussed in depth with the patient the nature of atherosclerosis, and emphasized the importance of maximal medical management including strict control of blood pressure, blood glucose, and lipid levels, obtaining regular exercise, and continued cessation of smoking.  The patient is aware that without maximal medical management the underlying atherosclerotic disease process will progress, limiting the benefit of any interventions. The patient was given information about stroke prevention and what symptoms should prompt the patient to seek immediate medical care. Thank you for allowing [place] to participate in this patient's care.  Clemon Chambers, RN, MSN, FNP-C [facility] [place] Office: [phone]  Clinic Physician: Oneida Alar  12/20/17 3:01 PM

## 2017-12-20 NOTE — Patient Instructions (Signed)

## 2017-12-28 ENCOUNTER — Other Ambulatory Visit: Payer: Self-pay | Admitting: "Endocrinology

## 2017-12-31 ENCOUNTER — Other Ambulatory Visit: Payer: Self-pay | Admitting: Family Medicine

## 2018-01-01 ENCOUNTER — Other Ambulatory Visit: Payer: Self-pay

## 2018-01-08 ENCOUNTER — Other Ambulatory Visit: Payer: Self-pay | Admitting: *Deleted

## 2018-01-08 NOTE — Patient Outreach (Signed)
[name]) Care Management  01/08/2018  Peter Dunlap Jul 19, 1955 [id number]   Referral Date: [id number] Referral Source:EMMI campaign Referral Reason: High ED utilization [place] [facility] telephone call to patient.  [employer] compliance verified.  Patient had worked with [facility] in the past. RN called to do screening process. Per patient he feels his health is doing very good. Patient stated he does not need the services of Registered nurse, Social worker or pharmacy at this time.  Plan: RN will send and unsuccessful outreach letter [place] Management [phone]

## 2018-01-17 ENCOUNTER — Other Ambulatory Visit: Payer: Self-pay | Admitting: *Deleted

## 2018-01-17 NOTE — Telephone Encounter (Signed)
Received call from patient.   Requested to refill Percocet.   Ok to refill??  Last office visit  Last refill 12/17/2017.

## 2018-01-18 MED ORDER — OXYCODONE-ACETAMINOPHEN 10-325 MG PO TABS: 1.0000 | ORAL_TABLET | Freq: Three times a day (TID) | ORAL | 0 refills | Status: DC | PRN

## 2018-01-21 ENCOUNTER — Other Ambulatory Visit: Payer: Self-pay | Admitting: Family Medicine

## 2018-01-28 DIAGNOSIS — G4733 Obstructive sleep apnea (adult) (pediatric): Secondary | ICD-10-CM | POA: Diagnosis not present

## 2018-01-29 ENCOUNTER — Other Ambulatory Visit: Payer: Self-pay | Admitting: Internal Medicine

## 2018-01-29 ENCOUNTER — Other Ambulatory Visit: Payer: Self-pay | Admitting: "Endocrinology

## 2018-01-29 ENCOUNTER — Other Ambulatory Visit: Payer: Self-pay | Admitting: Family Medicine

## 2018-01-29 DIAGNOSIS — R0600 Dyspnea, unspecified: Secondary | ICD-10-CM

## 2018-01-29 DIAGNOSIS — R634 Abnormal weight loss: Secondary | ICD-10-CM

## 2018-01-29 DIAGNOSIS — I209 Angina pectoris, unspecified: Secondary | ICD-10-CM

## 2018-01-29 DIAGNOSIS — M10071 Idiopathic gout, right ankle and foot: Secondary | ICD-10-CM

## 2018-02-12 ENCOUNTER — Ambulatory Visit (INDEPENDENT_AMBULATORY_CARE_PROVIDER_SITE_OTHER): Payer: Medicare Other | Admitting: *Deleted

## 2018-02-12 DIAGNOSIS — I495 Sick sinus syndrome: Secondary | ICD-10-CM

## 2018-02-12 NOTE — Progress Notes (Signed)
Remote pacemaker transmission.   

## 2018-02-14 DIAGNOSIS — E1169 Type 2 diabetes mellitus with other specified complication: Secondary | ICD-10-CM | POA: Diagnosis not present

## 2018-02-14 DIAGNOSIS — E890 Postprocedural hypothyroidism: Secondary | ICD-10-CM | POA: Diagnosis not present

## 2018-02-15 ENCOUNTER — Other Ambulatory Visit: Payer: Self-pay | Admitting: *Deleted

## 2018-02-15 LAB — HEMOGLOBIN A1C
Hgb A1c MFr Bld: 6.2 % of total Hgb — ABNORMAL HIGH (ref <5.7)
Mean Plasma Glucose: 131 (calc)
eAG (mmol/L): 7.3 (calc)

## 2018-02-15 LAB — COMPLETE METABOLIC PANEL WITH GFR
AG Ratio: 1.2 (calc) (ref 1.0–2.5)
ALT: 15 U/L (ref 9–46)
AST: 27 U/L (ref 10–35)
Albumin: 3.9 g/dL (ref 3.6–5.1)
Alkaline phosphatase (APISO): 83 U/L (ref 40–115)
BUN: 9 mg/dL (ref 7–25)
CO2: 26 mmol/L (ref 20–32)
Calcium: 9.5 mg/dL (ref 8.6–10.3)
Chloride: 103 mmol/L (ref 98–110)
Creat: 1.14 mg/dL (ref 0.70–1.25)
GFR, Est African American: 79 mL/min/{1.73_m2} (ref >=60)
GFR, Est Non African American: 69 mL/min/{1.73_m2} (ref >=60)
Globulin: 3.3 g/dL (calc) (ref 1.9–3.7)
Glucose, Bld: 114 mg/dL — ABNORMAL HIGH (ref 65–99)
Potassium: 4.1 mmol/L (ref 3.5–5.3)
Sodium: 140 mmol/L (ref 135–146)
Total Bilirubin: 0.8 mg/dL (ref 0.2–1.2)
Total Protein: 7.2 g/dL (ref 6.1–8.1)

## 2018-02-15 LAB — T4, FREE: Free T4: 1.7 ng/dL (ref 0.8–1.8)

## 2018-02-15 LAB — TSH: TSH: 1.5 mIU/L (ref 0.40–4.50)

## 2018-02-15 MED ORDER — OXYCODONE-ACETAMINOPHEN 10-325 MG PO TABS: 1.0000 | ORAL_TABLET | Freq: Three times a day (TID) | ORAL | 0 refills | Status: DC | PRN

## 2018-02-15 NOTE — Telephone Encounter (Signed)
Received call from patient.   Requested refill on Oxycodone/APAP.   Ok to refill??   Last office 09/03/2017.e visit  Last refill 01/18/2018.

## 2018-02-16 ENCOUNTER — Other Ambulatory Visit: Payer: Self-pay | Admitting: Family Medicine

## 2018-02-21 ENCOUNTER — Ambulatory Visit (INDEPENDENT_AMBULATORY_CARE_PROVIDER_SITE_OTHER): Payer: Medicare Other | Admitting: "Endocrinology

## 2018-02-21 ENCOUNTER — Encounter: Payer: Self-pay | Admitting: "Endocrinology

## 2018-02-21 VITALS — BP 134/78 | HR 62 | Ht 72.0 in | Wt 318.0 lb

## 2018-02-21 DIAGNOSIS — E782 Mixed hyperlipidemia: Secondary | ICD-10-CM

## 2018-02-21 DIAGNOSIS — E669 Obesity, unspecified: Secondary | ICD-10-CM

## 2018-02-21 DIAGNOSIS — I10 Essential (primary) hypertension: Secondary | ICD-10-CM

## 2018-02-21 DIAGNOSIS — E1169 Type 2 diabetes mellitus with other specified complication: Secondary | ICD-10-CM

## 2018-02-21 DIAGNOSIS — E559 Vitamin D deficiency, unspecified: Secondary | ICD-10-CM | POA: Insufficient documentation

## 2018-02-21 DIAGNOSIS — E890 Postprocedural hypothyroidism: Secondary | ICD-10-CM

## 2018-02-21 MED ORDER — SIMVASTATIN 40 MG PO TABS: 40.0000 mg | ORAL_TABLET | Freq: Every day | ORAL | 1 refills | Status: DC

## 2018-02-21 MED ORDER — LEVOTHYROXINE SODIUM 200 MCG PO TABS: ORAL_TABLET | ORAL | 6 refills | Status: DC

## 2018-02-21 MED ORDER — METFORMIN HCL 500 MG PO TABS: ORAL_TABLET | ORAL | 6 refills | Status: DC

## 2018-02-21 NOTE — Progress Notes (Signed)
Endocrinology follow-up note   Subjective:    Patient ID: Peter Dunlap, male    DOB: 18-Sep-1955, PCP [name], MD   Past Medical History:  Diagnosis Date  . Arteriosclerotic cardiovascular disease (ASCVD) 2005   catheterization in 10/2010:50% mid LAD, diffuse distal disease, circumflex irregularities, large dominant RCA with a 50% ostial, 70% distal, 60% posterolateral and 70% PDA; normal EF  . Arthritis   . Benign prostatic hypertrophy   . Cerebrovascular disease 2010   R. carotid endarterectomy; Duplex in 10/2010-widely patent ICAs, subtotal left vertebral-not thought to be contributing to symptoms  . Cervical spine disease    CT in 2012-advanced degeneration and spondylosis with moderate spinal stenosis at C3-C6  . Depression   . Erectile dysfunction   . Gastroesophageal reflux disease   . H/O hiatal hernia   . H/O: substance abuse    Cocaine, marijuana, alcohol.  Quit 2013.   [name] Hyperlipidemia   . Hypertension   . Non-ST elevation myocardial infarction (NSTEMI), initial episode of care [facility]) 12/02/2013   DES LAD  . Obesity   . Prostate cancer ([place])   . Sleep apnea    CPAP  . Tachy-brady syndrome ([place])    a. s/p STJ dual chamber PPM   . Thyroid disease   . Tobacco abuse    Quit 2014   Past Surgical History:  Procedure Laterality Date  . BRAIN SURGERY  2015   hematoma evacuation  . BURR HOLE Right 04/13/2014   Procedure: Haskell Flirt;  Surgeon: Charlie Pitter, MD;  Location: [place] [facility];  Service: Neurosurgery;  Laterality: Right;  . CAROTID ENDARTERECTOMY Right Feb. 25, 2010    CEA  . CORONARY ANGIOPLASTY WITH STENT PLACEMENT  12/03/2013   LAD 90%-->0% W/ Promus Premier DES 3.0 mm x 16 mm, CFX OK, RCA 40%, EF 70-75%  . LEFT ATRIAL APPENDAGE OCCLUSION N/A 08/05/2015   Procedure: LEFT ATRIAL APPENDAGE OCCLUSION;  Surgeon: [name], MD;  Location: [facility] CV LAB;  Service: Cardiovascular;  Laterality: N/A;  . LEFT HEART CATHETERIZATION WITH CORONARY  ANGIOGRAM Left 12/03/2013   Procedure: LEFT HEART CATHETERIZATION WITH CORONARY ANGIOGRAM;  Surgeon: Leonie Man, MD;  Location: [facility] CATH LAB;  Service: Cardiovascular;  Laterality: Left;  . LEFT HEART CATHETERIZATION WITH CORONARY ANGIOGRAM N/A 01/26/2014   Procedure: LEFT HEART CATHETERIZATION WITH CORONARY ANGIOGRAM;  Surgeon: Jettie Booze, MD;  Location: [facility] CATH LAB;  Service: Cardiovascular;  Laterality: N/A;  . LEFT HEART CATHETERIZATION WITH CORONARY ANGIOGRAM N/A 08/03/2014   Procedure: LEFT HEART CATHETERIZATION WITH CORONARY ANGIOGRAM;  Surgeon: Burnell Blanks, MD;  Location: [facility] CATH LAB;  Service: Cardiovascular;  Laterality: N/A;  . PERCUTANEOUS CORONARY STENT INTERVENTION (PCI-S)  12/03/2013   Procedure: PERCUTANEOUS CORONARY STENT INTERVENTION (PCI-S);  Surgeon: Leonie Man, MD;  Location: [facility] CATH LAB;  Service: Cardiovascular;;  . PERMANENT PACEMAKER INSERTION N/A 09/18/2014   Procedure: PERMANENT PACEMAKER INSERTION;  Surgeon: [name], MD;  Location: [facility] CATH LAB;  Service: Cardiovascular;  Laterality: N/A;  . RADIOFREQUENCY ABLATION  2005   for PSVT  . TEE WITHOUT CARDIOVERSION N/A 07/27/2015   Procedure: TRANSESOPHAGEAL ECHOCARDIOGRAM (TEE);  Surgeon: Lelon Perla, MD;  Location: [facility] ENDOSCOPY;  Service: Cardiovascular;  Laterality: N/A;  . TEE WITHOUT CARDIOVERSION N/A 09/15/2015   Procedure: TRANSESOPHAGEAL ECHOCARDIOGRAM (TEE);  Surgeon: Thayer Headings, MD;  Location: [facility] ENDOSCOPY;  Service: Cardiovascular;  Laterality: N/A;   Social History   Socioeconomic History  . Marital status:  Married    Spouse name: Not on file  . Number of children: 0  . Years of education: Not on file  . Highest education level: Not on file  Occupational History  . Occupation: Retired  Scientific laboratory technician  . Financial resource strain: Not on file  . Food insecurity:    Worry: Not on file    Inability: Not on file  . Transportation needs:    Medical: Not on file     Non-medical: Not on file  Tobacco Use  . Smoking status: Former Smoker    Packs/day: 1.00    Years: 40.00    Pack years: 40.00    Types: Cigarettes    Start date: 10/20/1972    Last attempt to quit: 10/10/2012    Years since quitting: 5.3  . Smokeless tobacco: Never Used  . Tobacco comment: Quit in May.   Substance and Sexual Activity  . Alcohol use: No    Alcohol/week: 0.0 standard drinks    Comment: former drinker-- sober since 2013.   . Drug use: No    Types: Cocaine    Comment: quit cocaine 10/2011  . Sexual activity: Yes    Partners: Female  Lifestyle  . Physical activity:    Days per week: Not on file    Minutes per session: Not on file  . Stress: Not on file  Relationships  . Social connections:    Talks on phone: Not on file    Gets together: Not on file    Attends religious service: Not on file    Active member of club or organization: Not on file    Attends meetings of clubs or organizations: Not on file    Relationship status: Not on file  Other Topics Concern  . Not on file  Social History Narrative   Lives in [place].   Outpatient Encounter Medications as of 02/21/2018  Medication Sig  . allopurinol (ZYLOPRIM) 100 MG tablet TAKE 1 TABLET BY MOUTH EVERY DAY  . aspirin 81 MG chewable tablet Chew 81 mg by mouth daily.   [name] aspirin EC 325 MG tablet Take 1 tablet (325 mg total) daily by mouth.  . Cholecalciferol (VITAMIN D3) 5000 units CAPS Take 1 capsule (5,000 Units total) by mouth daily.  . colchicine 0.6 MG tablet Take 1 tablet (0.6 mg total) by mouth daily.  [name] DEXILANT 60 MG capsule TAKE 1 CAPSULE BY MOUTH EVERY DAY  . furosemide (LASIX) 20 MG tablet TAKE 3 TABLETS BY MOUTH DAILY  . isosorbide mononitrate (IMDUR) 60 MG 24 hr tablet Take 1 tablet (60 mg total) by mouth daily. Please make yearly appt with Dr. Lovena Le for December for future refills. 1st attempt  . levothyroxine (SYNTHROID, LEVOTHROID) 200 MCG tablet TAKE 1 TABLET(200 MCG) BY MOUTH DAILY BEFORE  BREAKFAST  . MAGNESIUM-OXIDE 400 (241.3 Mg) MG tablet TAKE 1 TABLET BY MOUTH TWICE DAILY  . metFORMIN (GLUCOPHAGE) 500 MG tablet TAKE 1 TABLET(500 MG) BY MOUTH TWICE DAILY WITH A MEAL  . nitroGLYCERIN (NITROSTAT) 0.4 MG SL tablet PLACE 1 TABLET UNDER THE TONGUE EVERY 5 MINUTES AS NEEDED FOR CHEST PAIN. CALL 911 AT THIRD DOSE IN 15 MINUTES  . ONE TOUCH ULTRA TEST test strip USE TO TEST ONCE DAILY  . oxyCODONE-acetaminophen (PERCOCET) 10-325 MG tablet Take 1 tablet by mouth every 8 (eight) hours as needed for pain.  . potassium chloride SA (K-DUR,KLOR-CON) 20 MEQ tablet Take 1 tablet (20 mEq total) by mouth daily. Please make yearly appt with  Dr. Lovena Le for December for future refills. 1st attempt  . RAPAFLO 8 MG CAPS capsule TAKE 1 CAPSULE(8 MG) BY MOUTH DAILY  . simvastatin (ZOCOR) 40 MG tablet Take 1 tablet (40 mg total) by mouth daily at 6 PM.  . sotalol (BETAPACE) 160 MG tablet TAKE 1 TABLET(160 MG) BY MOUTH TWICE DAILY  . traZODone (DESYREL) 50 MG tablet TAKE 1/2 TO 1 TABLET BY MOUTH AT BEDTIME AS NEEDED FOR SLEEP  . [DISCONTINUED] DEXILANT 60 MG capsule TAKE 1 CAPSULE BY MOUTH EVERY DAY  . [DISCONTINUED] levothyroxine (SYNTHROID, LEVOTHROID) 200 MCG tablet TAKE 1 TABLET(200 MCG) BY MOUTH DAILY BEFORE BREAKFAST  . [DISCONTINUED] metFORMIN (GLUCOPHAGE) 500 MG tablet Take 1 tablet (500 mg total) by mouth 2 (two) times daily with a meal.  . [DISCONTINUED] metFORMIN (GLUCOPHAGE) 500 MG tablet TAKE 1 TABLET(500 MG) BY MOUTH TWICE DAILY WITH A MEAL  . [DISCONTINUED] simvastatin (ZOCOR) 40 MG tablet TAKE 1 TABLET(40 MG) BY MOUTH DAILY   No facility-administered encounter medications on file as of 02/21/2018.    ALLERGIES: Allergies  Allergen Reactions  . Lactose Intolerance (Gi) Other (See Comments)    UPSET STOMACH    VACCINATION STATUS: Immunization History  Administered Date(s) Administered  . Influenza,inj,Quad PF,6+ Mos 02/17/2014, 03/09/2015, 03/10/2016, 03/27/2017  . Pneumococcal  Polysaccharide-23 01/05/2014  . Tdap 12/19/2010    HPI  Mr. Bayless is a 62 year old male patient  with medical history as above.  -He is returning for follow-up of his RAI induced hypothyroidism, type 2 diabetes, hyperlipidemia, hypertension. He was given RAI therapy on March 16, 2014 .  He is currently on levothyroxine 200 mcg p.o. every morning.  He reports better compliance, continues to feel better.   -He denies cold intolerance, constipation, fatigue.  He has a steady body weight since last visit.   - He also has controlled type 2 diabetes on metformin.  His previsit labs show A1c of 6.2% improving from 6.7%.   He remains on metformin 500 mg p.o. twice daily.     He still admits to dietary indiscretion.    Pt denies family history of thyroid dysfunction . He denies personal history of goiter. he has hx of a-fib, and CAD which required stent placement x 2.   Review of Systems  Constitutional: + fluctuating body weight,  no subjective hyperthermia nor hypothermia.  Eyes: no blurry vision, no xerophthalmia ENT: no sore throat, no nodules palpated in throat, no dysphagia/odynophagia, no hoarseness Cardiovascular: No chest pain, no palpitations, no leg swelling.   Respiratory: No cough, no shortness of breath.   Gastrointestinal: no N/V/D/C Musculoskeletal:  complains of right knee pain and left foot pain from gout.  Skin: no rashes Neurological: No tremors, no numbness, no tingling.   Psychiatric: no depression/anxiety  Objective:    BP 134/78   Pulse 62   Ht 6' (1.829 m)   Wt (!) 318 lb (144.2 kg)   BMI 43.13 kg/m   Wt Readings from Last 3 Encounters:  02/21/18 (!) 318 lb (144.2 kg)  12/20/17 (!) 311 lb (141.1 kg)  09/03/17 (!) 320 lb (145.2 kg)    Physical Exam Constitutional:   + Obese, not in acute distress.  Normal state of mind.  Eyes: PERRLA, EOMI, no exophthalmos ENT: Moist mucous membranes, no thyromegaly.   Musculoskeletal:  strength intact in all 4 Skin:  moist, warm, no rashes Neurological: no tremor with outstretched hands, DTR normal in all 4   Diabetic Labs (most recent): Lab Results  Component Value  Date   HGBA1C 6.2 (H) 02/14/2018   HGBA1C 6.7 (H) 08/13/2017   HGBA1C 5.5 08/14/2016     Lipid Panel ( most recent) Lipid Panel     Component Value Date/Time   CHOL 106 09/03/2017 0930   TRIG 132 09/03/2017 0930   HDL 29 (L) 09/03/2017 0930   CHOLHDL 3.7 09/03/2017 0930   VLDL 28 10/17/2016 0819   LDLCALC 55 09/03/2017 0930   Recent Results (from the past 2160 hour(s))  COMPLETE METABOLIC PANEL WITH GFR     Status: Abnormal   Collection Time: 02/14/18  8:36 AM  Result Value Ref Range   Glucose, Bld 114 (H) 65 - 99 mg/dL    Comment: .            Fasting reference interval . For someone without known diabetes, a glucose value between 100 and 125 mg/dL is consistent with prediabetes and should be confirmed with a follow-up test. .    BUN 9 7 - 25 mg/dL   Creat 1.14 0.70 - 1.25 mg/dL    Comment: For patients >58 years of age, the reference limit for Creatinine is approximately 13% higher for people identified as African-American. .    GFR, Est Non African American 69 > OR = 60 mL/min/1.72m2   GFR, Est African American 79 > OR = 60 mL/min/1.74m2   BUN/Creatinine Ratio NOT APPLICABLE 6 - 22 (calc)   Sodium 140 135 - 146 mmol/L   Potassium 4.1 3.5 - 5.3 mmol/L   Chloride 103 98 - 110 mmol/L   CO2 26 20 - 32 mmol/L   Calcium 9.5 8.6 - 10.3 mg/dL   Total Protein 7.2 6.1 - 8.1 g/dL   Albumin 3.9 3.6 - 5.1 g/dL   Globulin 3.3 1.9 - 3.7 g/dL (calc)   AG Ratio 1.2 1.0 - 2.5 (calc)   Total Bilirubin 0.8 0.2 - 1.2 mg/dL   Alkaline phosphatase (APISO) 83 40 - 115 U/L   AST 27 10 - 35 U/L   ALT 15 9 - 46 U/L  Hemoglobin A1c     Status: Abnormal   Collection Time: 02/14/18  8:36 AM  Result Value Ref Range   Hgb A1c MFr Bld 6.2 (H) <5.7 % of total Hgb    Comment: For someone without known diabetes, a hemoglobin  A1c value  between 5.7% and 6.4% is consistent with prediabetes and should be confirmed with a  follow-up test. . For someone with known diabetes, a value <7% indicates that their diabetes is well controlled. A1c targets should be individualized based on duration of diabetes, age, comorbid conditions, and other considerations. . This assay result is consistent with an increased risk of diabetes. . Currently, no consensus exists regarding use of hemoglobin A1c for diagnosis of diabetes for children. .    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  -He is advised to continue levothyroxine 200 mcg p.o. daily before breakfast.     - We discussed about correct intake  of levothyroxine, at fasting, with water, separated by at least 30 minutes from breakfast, and separated by more than 4 hours from calcium, iron, multivitamins, acid reflux medications (PPIs). -Patient is made aware of the fact that thyroid hormone replacement is needed for life, dose to be adjusted by periodic monitoring of thyroid function tests.   2. Diabetes mellitus type 2 in obese (HCC) -His A1c is improving to 6.2%.  He is advised to continue metformin 500 mg p.o. twice daily-daily after breakfast and supper.     -  Suggestion is made for him to avoid simple carbohydrates  from his diet including Cakes, Sweet Desserts / Pastries, Ice Cream, Soda (diet and regular), Sweet Tea, Candies, Chips, Cookies, Store Bought Juices, Alcohol in Excess of  1-2 drinks a day, Artificial Sweeteners, and "Sugar-free" Products. This will help patient to have stable blood  glucose profile and potentially avoid unintended weight gain.   3. Essential hypertension -His blood pressure is controlled to target at 134/78. He is advised to be consistent in taking his blood pressure medications including beta-blocker, Lasix.  4.  Hyperlipidemia: Controlled with LDL at 63. -He is advised to continue with simvastatin 40 mg p.o. nightly.   5. Obesity: he continues to gain weight.   -  Suggestion is made for him to avoid simple carbohydrates  from his diet including Cakes, Sweet Desserts / Pastries, Ice Cream, Soda (diet and regular), Sweet Tea, Candies, Chips, Cookies, Store Bought Juices, Alcohol in Excess of  1-2 drinks a day, Artificial Sweeteners, and "Sugar-free" Products. This will help patient to have stable blood glucose profile and potentially avoid unintended weight gain.   - I advised patient to maintain close follow up with [name], MD for primary care needs.    Follow up plan: Return in about 6 months (around 08/22/2018) for Follow up with Pre-visit Labs.  Glade Lloyd, MD Phone: [phone]  Fax: [phone]  This note was partially dictated with voice recognition software. Similar sounding words can be transcribed inadequately or may not  be corrected upon review.  02/21/2018, 1:07 PM

## 2018-02-21 NOTE — Patient Instructions (Signed)

## 2018-03-03 ENCOUNTER — Other Ambulatory Visit: Payer: Self-pay | Admitting: Family Medicine

## 2018-03-06 LAB — CUP PACEART REMOTE DEVICE CHECK
Battery Remaining Longevity: 121 mo
Battery Remaining Percentage: 95.5 %
Battery Voltage: 2.98 V
Brady Statistic AP VP Percent: 1 %
Brady Statistic AP VS Percent: 8.3 %
Brady Statistic AS VP Percent: 2.1 %
Brady Statistic AS VS Percent: 89 %
Brady Statistic RA Percent Paced: 2.2 %
Brady Statistic RV Percent Paced: 37 %
Date Time Interrogation Session: 20190910060015
Implantable Lead Implant Date: 20160415
Implantable Lead Implant Date: 20160415
Implantable Lead Location: 753859
Implantable Lead Location: 753860
Implantable Pulse Generator Implant Date: 20160415
Lead Channel Impedance Value: 440 Ohm
Lead Channel Impedance Value: 580 Ohm
Lead Channel Pacing Threshold Amplitude: 0.75 V
Lead Channel Pacing Threshold Amplitude: 1 V
Lead Channel Pacing Threshold Pulse Width: 0.5 ms
Lead Channel Pacing Threshold Pulse Width: 0.5 ms
Lead Channel Sensing Intrinsic Amplitude: 12 mV
Lead Channel Sensing Intrinsic Amplitude: 5 mV
Lead Channel Setting Pacing Amplitude: 2 V
Lead Channel Setting Pacing Amplitude: 2.5 V
Lead Channel Setting Pacing Pulse Width: 0.5 ms
Lead Channel Setting Sensing Sensitivity: 2 mV
Pulse Gen Model: 2240
Pulse Gen Serial Number: 7756161

## 2018-03-18 ENCOUNTER — Other Ambulatory Visit: Payer: Self-pay | Admitting: *Deleted

## 2018-03-18 MED ORDER — OXYCODONE-ACETAMINOPHEN 10-325 MG PO TABS: 1.0000 | ORAL_TABLET | Freq: Three times a day (TID) | ORAL | 0 refills | Status: DC | PRN

## 2018-03-18 NOTE — Telephone Encounter (Signed)
Received call from patient.   Requested refill on Oxycodone/APAP.   Ok to refill??  Last office visit 09/03/2017.  Last refill 02/15/2018.

## 2018-03-20 ENCOUNTER — Ambulatory Visit (INDEPENDENT_AMBULATORY_CARE_PROVIDER_SITE_OTHER): Payer: Medicare Other | Admitting: Family Medicine

## 2018-03-20 DIAGNOSIS — Z23 Encounter for immunization: Secondary | ICD-10-CM | POA: Diagnosis not present

## 2018-03-24 ENCOUNTER — Other Ambulatory Visit: Payer: Self-pay | Admitting: Family Medicine

## 2018-03-25 ENCOUNTER — Other Ambulatory Visit: Payer: Self-pay | Admitting: Nurse Practitioner

## 2018-03-26 ENCOUNTER — Ambulatory Visit (INDEPENDENT_AMBULATORY_CARE_PROVIDER_SITE_OTHER): Payer: Medicare Other | Admitting: Urology

## 2018-03-26 DIAGNOSIS — N401 Enlarged prostate with lower urinary tract symptoms: Secondary | ICD-10-CM

## 2018-03-26 DIAGNOSIS — R351 Nocturia: Secondary | ICD-10-CM

## 2018-03-26 DIAGNOSIS — C61 Malignant neoplasm of prostate: Secondary | ICD-10-CM

## 2018-04-16 ENCOUNTER — Other Ambulatory Visit: Payer: Self-pay | Admitting: *Deleted

## 2018-04-16 MED ORDER — OXYCODONE-ACETAMINOPHEN 10-325 MG PO TABS: 1.0000 | ORAL_TABLET | Freq: Three times a day (TID) | ORAL | 0 refills | Status: DC | PRN

## 2018-04-16 NOTE — Telephone Encounter (Signed)
Received call from patient.   Requested refill on Oxycodone/APAP.   Ok to refill??  Last office visit 09/03/2017.  Last refill 03/18/2018.

## 2018-04-29 DIAGNOSIS — G4733 Obstructive sleep apnea (adult) (pediatric): Secondary | ICD-10-CM | POA: Diagnosis not present

## 2018-05-01 ENCOUNTER — Encounter: Payer: Self-pay | Admitting: Family Medicine

## 2018-05-01 ENCOUNTER — Ambulatory Visit (INDEPENDENT_AMBULATORY_CARE_PROVIDER_SITE_OTHER): Payer: Medicare Other | Admitting: Family Medicine

## 2018-05-01 ENCOUNTER — Other Ambulatory Visit: Payer: Self-pay

## 2018-05-01 VITALS — BP 138/68 | HR 64 | Temp 98.8°F | Resp 16 | Ht 72.0 in | Wt 320.0 lb

## 2018-05-01 DIAGNOSIS — R251 Tremor, unspecified: Secondary | ICD-10-CM

## 2018-05-01 DIAGNOSIS — R6889 Other general symptoms and signs: Secondary | ICD-10-CM | POA: Diagnosis not present

## 2018-05-01 DIAGNOSIS — M542 Cervicalgia: Secondary | ICD-10-CM

## 2018-05-01 NOTE — Patient Instructions (Signed)
Get the xray  We will call with lab results [place], [address] F/U 3 months

## 2018-05-01 NOTE — Progress Notes (Signed)
   Subjective:    Patient ID: Peter Dunlap, male    DOB: December 23, 1955, 62 y.o.   MRN: [id number]  Patient presents for Muscle Spasms (B arms jump and spasm- L>R) He has been having uncontrolled spasm/jerks in his arms and legs. His left arm does go numb on occasion, feels like it goes to sleep and he has to shake it out  History of shoulder injuries  He is awake during all episodes, sometimes head or neck will also twitch to the side He does get some tingling in his fingertips, he admits to chronic neck pain    He is not on any new medications, only change is decreased to 1 rapaflo for prostate a day    His A1C 6.2% 2 months ago Thyroid at goal  He has gained weight, eating more   Review Of Systems: per above  GEN- denies fatigue, fever, weight loss,weakness, recent illness HEENT- denies eye drainage, change in vision, nasal discharge, CVS- denies chest pain, palpitations RESP- denies SOB, cough, wheeze ABD- denies N/V, change in stools, abd pain GU- denies dysuria, hematuria, dribbling, incontinence MSK- + joint pain, muscle aches, injury Neuro- denies headache, dizziness, syncope, seizure activity       Objective:    BP 138/68   Pulse 64   Temp 98.8 F (37.1 C) (Oral)   Resp 16   Ht 6' (1.829 m)   Wt (!) 320 lb (145.2 kg)   SpO2 97%   BMI 43.40 kg/m  GEN- NAD, alert and oriented x3 HEENT- PERRL, EOMI, non injected sclera, pink conjunctiva, MMM, oropharynx clear Neck- Supple, fair ROM, mild TTP C4-C5 region, neg spurlings CVS- RRR, no murmur RESP-CTAB Neuro-CNII-XII in tact, no focal deficits, normal monofilament on hands , normal tone UD MSK-moving ext equally upper ext, no spasms or fasiculations noted,  EXT- No edema Pulses- Radial, DP- 2+        Assessment & Plan:      Problem List Items Addressed This Visit    None    Visit Diagnoses    Neck pain    -  Primary   obtain C spine xray , evaluate disc and alignment   Relevant Orders   DG Lumbar  Spine Complete   Muscle tremor       check CK and mg, having some random fasiculations, may need neurology for muscle study/nerve conduction   Relevant Orders   Basic metabolic panel (Completed)   Magnesium (Completed)   Vitamin B12 (Completed)   CK (Completed)      Note: This dictation was prepared with Dragon dictation along with smaller phrase technology. Any transcriptional errors that result from this process are unintentional.

## 2018-05-02 LAB — MAGNESIUM: Magnesium: 2 mg/dL (ref 1.5–2.5)

## 2018-05-02 LAB — BASIC METABOLIC PANEL
BUN: 10 mg/dL (ref 7–25)
CO2: 26 mmol/L (ref 20–32)
Calcium: 9.5 mg/dL (ref 8.6–10.3)
Chloride: 103 mmol/L (ref 98–110)
Creat: 1.19 mg/dL (ref 0.70–1.25)
Glucose, Bld: 120 mg/dL — ABNORMAL HIGH (ref 65–99)
Potassium: 4.5 mmol/L (ref 3.5–5.3)
Sodium: 137 mmol/L (ref 135–146)

## 2018-05-02 LAB — CK: Total CK: 73 U/L (ref 44–196)

## 2018-05-02 LAB — VITAMIN B12: Vitamin B-12: 714 pg/mL (ref 200–1100)

## 2018-05-03 ENCOUNTER — Encounter: Payer: Self-pay | Admitting: Family Medicine

## 2018-05-03 ENCOUNTER — Other Ambulatory Visit: Payer: Self-pay | Admitting: Family Medicine

## 2018-05-03 NOTE — Addendum Note (Signed)
Addended by: [name] F on: 05/03/2018 09:07 AM   Modules accepted: Orders

## 2018-05-06 ENCOUNTER — Ambulatory Visit
Admission: RE | Admit: 2018-05-06 | Discharge: 2018-05-06 | Disposition: A | Discharge status: Home / Self Care | Payer: Medicare Other | Source: Ambulatory Visit | Attending: Family Medicine | Admitting: Family Medicine

## 2018-05-06 DIAGNOSIS — M542 Cervicalgia: Secondary | ICD-10-CM

## 2018-05-06 DIAGNOSIS — R251 Tremor, unspecified: Secondary | ICD-10-CM

## 2018-05-09 ENCOUNTER — Other Ambulatory Visit: Payer: Self-pay | Admitting: *Deleted

## 2018-05-09 DIAGNOSIS — M542 Cervicalgia: Secondary | ICD-10-CM

## 2018-05-09 DIAGNOSIS — M5030 Other cervical disc degeneration, unspecified cervical region: Secondary | ICD-10-CM

## 2018-05-09 DIAGNOSIS — G542 Cervical root disorders, not elsewhere classified: Secondary | ICD-10-CM

## 2018-05-10 ENCOUNTER — Telehealth: Payer: Self-pay | Admitting: Family Medicine

## 2018-05-10 ENCOUNTER — Other Ambulatory Visit: Payer: Self-pay | Admitting: Family Medicine

## 2018-05-10 DIAGNOSIS — G542 Cervical root disorders, not elsewhere classified: Secondary | ICD-10-CM

## 2018-05-10 DIAGNOSIS — M5030 Other cervical disc degeneration, unspecified cervical region: Secondary | ICD-10-CM

## 2018-05-10 DIAGNOSIS — M542 Cervicalgia: Secondary | ICD-10-CM

## 2018-05-10 NOTE — Telephone Encounter (Signed)
[facility] calling regarding this patient, saying that the patient had a pace maker and could not do the mri  Please call patricia back at [phone]

## 2018-05-10 NOTE — Telephone Encounter (Signed)
MD please advise

## 2018-05-10 NOTE — Telephone Encounter (Signed)
Change to CT of neck without contrast

## 2018-05-10 NOTE — Telephone Encounter (Signed)
New orders placed

## 2018-05-14 ENCOUNTER — Ambulatory Visit (INDEPENDENT_AMBULATORY_CARE_PROVIDER_SITE_OTHER): Payer: Medicare Other

## 2018-05-14 DIAGNOSIS — I495 Sick sinus syndrome: Secondary | ICD-10-CM

## 2018-05-14 DIAGNOSIS — I5032 Chronic diastolic (congestive) heart failure: Secondary | ICD-10-CM

## 2018-05-16 ENCOUNTER — Other Ambulatory Visit: Payer: Self-pay | Admitting: *Deleted

## 2018-05-16 NOTE — Telephone Encounter (Signed)
Received call from patient.   Requested refill on Oxycodone/ APAP  Ok to refill??  Last office visit 05/01/2018.  Last refill 04/16/2018.

## 2018-05-16 NOTE — Progress Notes (Signed)
Remote pacemaker transmission.   

## 2018-05-17 ENCOUNTER — Ambulatory Visit
Admission: RE | Admit: 2018-05-17 | Discharge: 2018-05-17 | Disposition: A | Discharge status: Home / Self Care | Payer: Medicare Other | Source: Ambulatory Visit | Attending: Family Medicine | Admitting: Family Medicine

## 2018-05-17 DIAGNOSIS — M5030 Other cervical disc degeneration, unspecified cervical region: Secondary | ICD-10-CM

## 2018-05-17 DIAGNOSIS — G542 Cervical root disorders, not elsewhere classified: Secondary | ICD-10-CM

## 2018-05-17 DIAGNOSIS — M542 Cervicalgia: Secondary | ICD-10-CM | POA: Diagnosis not present

## 2018-05-17 MED ORDER — OXYCODONE-ACETAMINOPHEN 10-325 MG PO TABS: 1.0000 | ORAL_TABLET | Freq: Three times a day (TID) | ORAL | 0 refills | Status: DC | PRN

## 2018-05-17 MED ORDER — IOPAMIDOL (ISOVUE-300) INJECTION 61%
75.0000 mL | Freq: Once | INTRAVENOUS | Status: AC | PRN
  Administered 2018-05-17: 75 mL via INTRAVENOUS

## 2018-05-22 ENCOUNTER — Encounter: Payer: Self-pay | Admitting: Internal Medicine

## 2018-05-22 ENCOUNTER — Ambulatory Visit (INDEPENDENT_AMBULATORY_CARE_PROVIDER_SITE_OTHER): Payer: Medicare Other | Admitting: Internal Medicine

## 2018-05-22 VITALS — BP 122/84 | HR 72 | Ht 72.0 in | Wt 325.0 lb

## 2018-05-22 DIAGNOSIS — I480 Paroxysmal atrial fibrillation: Secondary | ICD-10-CM

## 2018-05-22 DIAGNOSIS — I2510 Atherosclerotic heart disease of native coronary artery without angina pectoris: Secondary | ICD-10-CM | POA: Diagnosis not present

## 2018-05-22 DIAGNOSIS — I495 Sick sinus syndrome: Secondary | ICD-10-CM

## 2018-05-22 DIAGNOSIS — Z950 Presence of cardiac pacemaker: Secondary | ICD-10-CM

## 2018-05-22 NOTE — Progress Notes (Signed)
HPI Peter Dunlap returns today for followup of PAF, HTN, obesity and chronic diastolic heart failure. He admits to dietary indiscretion. He has a h/o subdural hematoma and underwent watchman procedure several years ago. He has gained weight. He has some arthritis and has a difficult time exercising. Allergies  Allergen Reactions  . Lactose Intolerance (Gi) Other (See Comments)    UPSET STOMACH      Current Outpatient Medications  Medication Sig Dispense Refill  . allopurinol (ZYLOPRIM) 100 MG tablet TAKE 1 TABLET BY MOUTH EVERY DAY 90 tablet 0  . aspirin EC 325 MG tablet Take 1 tablet (325 mg total) daily by mouth. 30 tablet 0  . Cholecalciferol (VITAMIN D3) 5000 units CAPS Take 1 capsule (5,000 Units total) by mouth daily. 90 capsule 0  . colchicine 0.6 MG tablet Take 1 tablet (0.6 mg total) by mouth daily. 15 tablet 0  . DEXILANT 60 MG capsule TAKE 1 CAPSULE BY MOUTH EVERY DAY 30 capsule 0  . furosemide (LASIX) 20 MG tablet TAKE 3 TABLETS BY MOUTH DAILY 270 tablet 2  . isosorbide mononitrate (IMDUR) 60 MG 24 hr tablet Take 1 tablet (60 mg total) by mouth daily. Please make yearly appt with Dr. Lovena Le for December for future refills. 1st attempt 90 tablet 1  . levothyroxine (SYNTHROID, LEVOTHROID) 200 MCG tablet TAKE 1 TABLET(200 MCG) BY MOUTH DAILY BEFORE BREAKFAST 30 tablet 6  . MAGNESIUM-OXIDE 400 (241.3 Mg) MG tablet TAKE 1 TABLET BY MOUTH TWICE DAILY 60 tablet 11  . metFORMIN (GLUCOPHAGE) 500 MG tablet TAKE 1 TABLET(500 MG) BY MOUTH TWICE DAILY WITH A MEAL 60 tablet 6  . nitroGLYCERIN (NITROSTAT) 0.4 MG SL tablet PLACE 1 TABLET UNDER THE TONGUE EVERY 5 MINUTES AS NEEDED FOR CHEST PAIN. CALL 911 AT THIRD DOSE IN 15 MINUTES 25 tablet 1  . ONE TOUCH ULTRA TEST test strip USE TO TEST ONCE DAILY 50 each 1  . oxyCODONE-acetaminophen (PERCOCET) 10-325 MG tablet Take 1 tablet by mouth every 8 (eight) hours as needed for pain. 60 tablet 0  . potassium chloride SA (K-DUR,KLOR-CON) 20 MEQ  tablet Take 1 tablet (20 mEq total) by mouth daily. Please make yearly appt with Dr. Lovena Le for December for future refills. 1st attempt 90 tablet 1  . RAPAFLO 8 MG CAPS capsule TAKE 1 CAPSULE(8 MG) BY MOUTH DAILY 30 capsule 2  . simvastatin (ZOCOR) 40 MG tablet Take 1 tablet (40 mg total) by mouth daily at 6 PM. 90 tablet 1  . sotalol (BETAPACE) 160 MG tablet TAKE 1 TABLET(160 MG) BY MOUTH TWICE DAILY 180 tablet 3  . traZODone (DESYREL) 50 MG tablet TAKE 1/2 TO 1 TABLET BY MOUTH AT BEDTIME AS NEEDED FOR SLEEP 30 tablet 3   No current facility-administered medications for this visit.      Past Medical History:  Diagnosis Date  . Arteriosclerotic cardiovascular disease (ASCVD) 2005   catheterization in 10/2010:50% mid LAD, diffuse distal disease, circumflex irregularities, large dominant RCA with a 50% ostial, 70% distal, 60% posterolateral and 70% PDA; normal EF  . Arthritis   . Benign prostatic hypertrophy   . Cerebrovascular disease 2010   R. carotid endarterectomy; Duplex in 10/2010-widely patent ICAs, subtotal left vertebral-not thought to be contributing to symptoms  . Cervical spine disease    CT in 2012-advanced degeneration and spondylosis with moderate spinal stenosis at C3-C6  . Depression   . Erectile dysfunction   . Gastroesophageal reflux disease   . H/O hiatal hernia   .  H/O: substance abuse ([place])    Cocaine, marijuana, alcohol.  Quit 2013.   [name] Hyperlipidemia   . Hypertension   . Non-ST elevation myocardial infarction (NSTEMI), initial episode of care [facility]) 12/02/2013   DES LAD  . Obesity   . Prostate cancer ([place])   . Sleep apnea    CPAP  . Tachy-brady syndrome ([place])    a. s/p STJ dual chamber PPM   . Thyroid disease   . Tobacco abuse    Quit 2014    ROS:   All systems reviewed and negative except as noted in the HPI.   Past Surgical History:  Procedure Laterality Date  . BRAIN SURGERY  2015   hematoma evacuation  . BURR HOLE Right 04/13/2014   Procedure:  Haskell Flirt;  Surgeon: Charlie Pitter, MD;  Location: [place] NEURO ORS;  Service: Neurosurgery;  Laterality: Right;  . CAROTID ENDARTERECTOMY Right Feb. 25, 2010    CEA  . CORONARY ANGIOPLASTY WITH STENT PLACEMENT  12/03/2013   LAD 90%-->0% W/ Promus Premier DES 3.0 mm x 16 mm, CFX OK, RCA 40%, EF 70-75%  . LEFT ATRIAL APPENDAGE OCCLUSION N/A 08/05/2015   Procedure: LEFT ATRIAL APPENDAGE OCCLUSION;  Surgeon: [name], MD;  Location: [place] CV LAB;  Service: Cardiovascular;  Laterality: N/A;  . LEFT HEART CATHETERIZATION WITH CORONARY ANGIOGRAM Left 12/03/2013   Procedure: LEFT HEART CATHETERIZATION WITH CORONARY ANGIOGRAM;  Surgeon: Leonie Man, MD;  Location: [facility] CATH LAB;  Service: Cardiovascular;  Laterality: Left;  . LEFT HEART CATHETERIZATION WITH CORONARY ANGIOGRAM N/A 01/26/2014   Procedure: LEFT HEART CATHETERIZATION WITH CORONARY ANGIOGRAM;  Surgeon: Jettie Booze, MD;  Location: [facility] CATH LAB;  Service: Cardiovascular;  Laterality: N/A;  . LEFT HEART CATHETERIZATION WITH CORONARY ANGIOGRAM N/A 08/03/2014   Procedure: LEFT HEART CATHETERIZATION WITH CORONARY ANGIOGRAM;  Surgeon: Burnell Blanks, MD;  Location: [facility] CATH LAB;  Service: Cardiovascular;  Laterality: N/A;  . PERCUTANEOUS CORONARY STENT INTERVENTION (PCI-S)  12/03/2013   Procedure: PERCUTANEOUS CORONARY STENT INTERVENTION (PCI-S);  Surgeon: Leonie Man, MD;  Location: [facility] CATH LAB;  Service: Cardiovascular;;  . PERMANENT PACEMAKER INSERTION N/A 09/18/2014   Procedure: PERMANENT PACEMAKER INSERTION;  Surgeon: [name], MD;  Location: [facility] CATH LAB;  Service: Cardiovascular;  Laterality: N/A;  . RADIOFREQUENCY ABLATION  2005   for PSVT  . TEE WITHOUT CARDIOVERSION N/A 07/27/2015   Procedure: TRANSESOPHAGEAL ECHOCARDIOGRAM (TEE);  Surgeon: Lelon Perla, MD;  Location: [facility] ENDOSCOPY;  Service: Cardiovascular;  Laterality: N/A;  . TEE WITHOUT CARDIOVERSION N/A 09/15/2015   Procedure: TRANSESOPHAGEAL ECHOCARDIOGRAM  (TEE);  Surgeon: Thayer Headings, MD;  Location: [facility] ENDOSCOPY;  Service: Cardiovascular;  Laterality: N/A;     Family History  Problem Relation Age of Onset  . Hypertension Mother        Cerebrovascular disease  . Diabetes Mother   . Coronary artery disease Father 44  . Diabetes type II Father   . Hypertension Father   . Heart attack Father   . Diabetes Brother   . Hypertension Brother   . Lung cancer Paternal Uncle   . Diabetes Sister   . Hypertension Sister   . Heart attack Sister 40  . Cancer Sister        leukemia  . Cancer Maternal Uncle        breast  . Cancer Maternal Grandmother        breast     Social History   Socioeconomic History  . Marital  status: Married    Spouse name: Not on file  . Number of children: 0  . Years of education: Not on file  . Highest education level: Not on file  Occupational History  . Occupation: Retired  Scientific laboratory technician  . Financial resource strain: Not on file  . Food insecurity:    Worry: Not on file    Inability: Not on file  . Transportation needs:    Medical: Not on file    Non-medical: Not on file  Tobacco Use  . Smoking status: Former Smoker    Packs/day: 1.00    Years: 40.00    Pack years: 40.00    Types: Cigarettes    Start date: 10/20/1972    Last attempt to quit: 10/10/2012    Years since quitting: 5.6  . Smokeless tobacco: Never Used  . Tobacco comment: Quit in May.   Substance and Sexual Activity  . Alcohol use: No    Alcohol/week: 0.0 standard drinks    Comment: former drinker-- sober since 2013.   . Drug use: No    Types: Cocaine    Comment: quit cocaine 10/2011  . Sexual activity: Yes    Partners: Female  Lifestyle  . Physical activity:    Days per week: Not on file    Minutes per session: Not on file  . Stress: Not on file  Relationships  . Social connections:    Talks on phone: Not on file    Gets together: Not on file    Attends religious service: Not on file    Active member of club or  organization: Not on file    Attends meetings of clubs or organizations: Not on file    Relationship status: Not on file  . Intimate partner violence:    Fear of current or ex partner: Not on file    Emotionally abused: Not on file    Physically abused: Not on file    Forced sexual activity: Not on file  Other Topics Concern  . Not on file  Social History Narrative   Lives in [place].     BP 122/84   Pulse 72   Ht 6' (1.829 m)   Wt (!) 325 lb (147.4 kg)   BMI 44.08 kg/m   Physical Exam:  Well appearing NAD HEENT: Unremarkable Neck:  No JVD, no thyromegally Lymphatics:  No adenopathy Back:  No CVA tenderness Lungs:  Clear HEART:  Regular rate rhythm, no murmurs, no rubs, no clicks Abd:  soft, positive bowel sounds, no organomegally, no rebound, no guarding Ext:  2 plus pulses, no edema, no cyanosis, no clubbing Skin:  No rashes no nodules Neuro:  CN II through XII intact, motor grossly intact  EKG - nsr  DEVICE  Normal device function.  See PaceArt for details.   Assess/Plan: 1. PAF - he is maintaining NSR. He will continue his current meds. 2. Obesity - I encouraged the patient to lose weight.  3. HTN - his blood pressure is well controlled. No change.  Mikle Bosworth.D.

## 2018-05-22 NOTE — Patient Instructions (Signed)
Medication Instructions:  Your physician recommends that you continue on your current medications as directed. Please refer to the Current Medication list given to you today.  Labwork: None ordered.  Testing/Procedures: None ordered.  Follow-Up: Your physician wants you to follow-up in: one year with Dr. Lovena Le.   You will receive a reminder letter in the mail two months in advance. If you don't receive a letter, please call our office to schedule the follow-up appointment.  Remote monitoring is used to monitor your Pacemaker from home. This monitoring reduces the number of office visits required to check your device to one time per year. It allows [place] to keep an eye on the functioning of your device to ensure it is working properly. You are scheduled for a device check from home on 08/13/2018. You may send your transmission at any time that day. If you have a wireless device, the transmission will be sent automatically. After your physician reviews your transmission, you will receive a postcard with your next transmission date.  Any Other Special Instructions Will Be Listed Below (If Applicable).  If you need a refill on your cardiac medications before your next appointment, please call your pharmacy.

## 2018-05-23 ENCOUNTER — Other Ambulatory Visit: Payer: Self-pay | Admitting: *Deleted

## 2018-05-23 DIAGNOSIS — R202 Paresthesia of skin: Secondary | ICD-10-CM

## 2018-05-23 DIAGNOSIS — M5030 Other cervical disc degeneration, unspecified cervical region: Secondary | ICD-10-CM

## 2018-05-23 DIAGNOSIS — G542 Cervical root disorders, not elsewhere classified: Secondary | ICD-10-CM

## 2018-05-23 DIAGNOSIS — R200 Anesthesia of skin: Secondary | ICD-10-CM

## 2018-05-23 DIAGNOSIS — M542 Cervicalgia: Secondary | ICD-10-CM

## 2018-05-24 LAB — CUP PACEART INCLINIC DEVICE CHECK
Date Time Interrogation Session: 20191220113418
Implantable Lead Implant Date: 20160415
Implantable Lead Implant Date: 20160415
Implantable Lead Location: 753859
Implantable Lead Location: 753860
Implantable Pulse Generator Implant Date: 20160415
Pulse Gen Model: 2240
Pulse Gen Serial Number: 7756161

## 2018-05-27 ENCOUNTER — Other Ambulatory Visit: Payer: Self-pay | Admitting: Internal Medicine

## 2018-06-01 ENCOUNTER — Other Ambulatory Visit: Payer: Self-pay | Admitting: Family Medicine

## 2018-06-17 ENCOUNTER — Other Ambulatory Visit: Payer: Self-pay | Admitting: *Deleted

## 2018-06-17 MED ORDER — OXYCODONE-ACETAMINOPHEN 10-325 MG PO TABS: 1.0000 | ORAL_TABLET | Freq: Three times a day (TID) | ORAL | 0 refills | Status: DC | PRN

## 2018-06-17 NOTE — Telephone Encounter (Signed)
Received call from patient.   Requested refill on Oxycodone/APAP.  Ok to refill??  Last office visit 05/01/2018.  Last refill 05/17/2018.

## 2018-06-29 LAB — CUP PACEART REMOTE DEVICE CHECK
Battery Remaining Longevity: 120 mo
Battery Remaining Percentage: 95.5 %
Battery Voltage: 2.98 V
Brady Statistic AP VP Percent: 1.2 %
Brady Statistic AP VS Percent: 11 %
Brady Statistic AS VP Percent: 2.7 %
Brady Statistic AS VS Percent: 84 %
Brady Statistic RA Percent Paced: 3.8 %
Brady Statistic RV Percent Paced: 35 %
Date Time Interrogation Session: 20191210091535
Implantable Lead Implant Date: 20160415
Implantable Lead Implant Date: 20160415
Implantable Lead Location: 753859
Implantable Lead Location: 753860
Implantable Pulse Generator Implant Date: 20160415
Lead Channel Impedance Value: 430 Ohm
Lead Channel Impedance Value: 560 Ohm
Lead Channel Pacing Threshold Amplitude: 0.75 V
Lead Channel Pacing Threshold Amplitude: 1 V
Lead Channel Pacing Threshold Pulse Width: 0.5 ms
Lead Channel Pacing Threshold Pulse Width: 0.5 ms
Lead Channel Sensing Intrinsic Amplitude: 12 mV
Lead Channel Sensing Intrinsic Amplitude: 3.2 mV
Lead Channel Setting Pacing Amplitude: 2 V
Lead Channel Setting Pacing Amplitude: 2.5 V
Lead Channel Setting Pacing Pulse Width: 0.5 ms
Lead Channel Setting Sensing Sensitivity: 2 mV
Pulse Gen Model: 2240
Pulse Gen Serial Number: 7756161

## 2018-07-02 ENCOUNTER — Ambulatory Visit (INDEPENDENT_AMBULATORY_CARE_PROVIDER_SITE_OTHER): Payer: Medicare Other | Admitting: Urology

## 2018-07-02 DIAGNOSIS — R351 Nocturia: Secondary | ICD-10-CM

## 2018-07-02 DIAGNOSIS — N401 Enlarged prostate with lower urinary tract symptoms: Secondary | ICD-10-CM

## 2018-07-02 DIAGNOSIS — C61 Malignant neoplasm of prostate: Secondary | ICD-10-CM | POA: Diagnosis not present

## 2018-07-03 ENCOUNTER — Ambulatory Visit (INDEPENDENT_AMBULATORY_CARE_PROVIDER_SITE_OTHER): Payer: Medicare Other | Admitting: Neurology

## 2018-07-03 ENCOUNTER — Encounter: Payer: Self-pay | Admitting: Neurology

## 2018-07-03 DIAGNOSIS — G5603 Carpal tunnel syndrome, bilateral upper limbs: Secondary | ICD-10-CM

## 2018-07-03 DIAGNOSIS — G5600 Carpal tunnel syndrome, unspecified upper limb: Secondary | ICD-10-CM | POA: Insufficient documentation

## 2018-07-03 DIAGNOSIS — G542 Cervical root disorders, not elsewhere classified: Secondary | ICD-10-CM

## 2018-07-03 DIAGNOSIS — G5620 Lesion of ulnar nerve, unspecified upper limb: Secondary | ICD-10-CM | POA: Insufficient documentation

## 2018-07-03 DIAGNOSIS — M542 Cervicalgia: Secondary | ICD-10-CM

## 2018-07-03 DIAGNOSIS — M5030 Other cervical disc degeneration, unspecified cervical region: Secondary | ICD-10-CM

## 2018-07-03 HISTORY — DX: Carpal tunnel syndrome, bilateral upper limbs: G56.03

## 2018-07-03 HISTORY — DX: Lesion of ulnar nerve, unspecified upper limb: G56.20

## 2018-07-03 NOTE — Progress Notes (Addendum)
  [facility]    Nerve / Sites Muscle Latency Ref. Amplitude Ref. Rel Amp Segments Distance Velocity Ref. Area    ms ms mV mV %  cm m/s m/s mVms  R Median - APB     Wrist APB 4.7 ?4.4 6.0 ?4.0 100 Wrist - APB 7   21.0     Upper arm APB 9.8  6.1  101 Upper arm - Wrist 25 49 ?49 23.1  L Median - APB     Wrist APB 4.2 ?4.4 7.1 ?4.0 100 Wrist - APB 7   25.5     Upper arm APB 9.1  6.6  92.8 Upper arm - Wrist 25 52 ?49 25.4  R Ulnar - ADM     Wrist ADM 3.0 ?3.3 11.1 ?6.0 100 Wrist - ADM 7   30.1     B.Elbow ADM 7.3  7.3  65.5 B.Elbow - Wrist 22 51 ?49 22.0     A.Elbow ADM 10.2  6.6  90.1 A.Elbow - B.Elbow 10 35 ?49 20.0         A.Elbow - Wrist      L Ulnar - ADM     Wrist ADM 2.8 ?3.3 11.1 ?6.0 100 Wrist - ADM 7   33.0     B.Elbow ADM 7.0  10.2  91.6 B.Elbow - Wrist 21 50 ?49 31.2     A.Elbow ADM 9.7  9.5  92.9 A.Elbow - B.Elbow 10 37 ?49 28.6         A.Elbow - Wrist                 SNC    Nerve / Sites Rec. Site Peak Lat Ref.  Amp Ref. Segments Distance    ms ms V V  cm  R Median - Orthodromic (Dig II, Mid palm)     Dig II Wrist 3.6 ?3.4 9 ?10 Dig II - Wrist 13  L Median - Orthodromic (Dig II, Mid palm)     Dig II Wrist 3.5 ?3.4 10 ?10 Dig II - Wrist 13  R Ulnar - Orthodromic, (Dig V, Mid palm)     Dig V Wrist 2.8 ?3.1 5 ?5 Dig V - Wrist 11  L Ulnar - Orthodromic, (Dig V, Mid palm)     Dig V Wrist 3.0 ?3.1 6 ?5 Dig V - Wrist 65              F  Wave    Nerve F Lat Ref.   ms ms  R Ulnar - ADM 34.8 ?32.0  L Ulnar - ADM 34.8 ?32.0

## 2018-07-03 NOTE — Procedures (Signed)
     HISTORY:  Peter Dunlap is a 63 year old gentleman with history of prediabetes and obesity who reports a several month history of left greater than right upper extremity discomfort from the hands up the arms to the shoulders with some associated neck pain.  The patient is being evaluated for possible neuropathy or a cervical radiculopathy.  NERVE CONDUCTION STUDIES:  Nerve conduction studies were performed on both upper extremities.  The distal motor latencies for the median nerves were prolonged bilaterally with normal motor amplitudes for these nerves bilaterally.  The distal motor latencies and motor amplitudes for the ulnar nerves were normal bilaterally.  The nerve conduction velocities for the median nerves were normal bilaterally but were slowed above the elbows only for the ulnar nerves bilaterally.  The sensory latencies for the median nerves were prolonged bilaterally, normal for the ulnar nerves bilaterally.  The F-wave latencies for the ulnar nerves were prolonged bilaterally.  EMG STUDIES:  EMG study was performed on the left upper extremity:  The first dorsal interosseous muscle reveals 2 to 4 K units with full recruitment. No fibrillations or positive waves were noted. The abductor pollicis brevis muscle reveals 2 to 4 K units with full recruitment. 1+ positive waves were noted. The extensor indicis proprius muscle reveals 1 to 3 K units with full recruitment. No fibrillations or positive waves were noted. The pronator teres muscle reveals 2 to 3 K units with full recruitment. No fibrillations or positive waves were noted. The flexor carpi ulnaris muscle (III-IV) reveals 2-3 K units with full recruitment.  No fibrillations or positive waves were noted. The biceps muscle reveals 1 to 2 K units with full recruitment. No fibrillations or positive waves were noted. The triceps muscle reveals 2 to 4 K units with full recruitment. No fibrillations or positive waves were  noted. The anterior deltoid muscle reveals 2 to 3 K units with full recruitment. No fibrillations or positive waves were noted. The cervical paraspinal muscles were tested at 2 levels. No abnormalities of insertional activity were seen at either level tested. There was good relaxation.   IMPRESSION:  Nerve conduction studies done on both upper extremities shows evidence of mild bilateral carpal tunnel syndrome and evidence of mild bilateral ulnar neuropathies at the elbow.  The EMG evaluation of the left upper extremity does not show evidence of an overlying cervical radiculopathy.  Jill Alexanders MD 07/03/2018 4:12 PM  [facility] [address] [place], [place] [address]  Phone [phone] Fax [phone]

## 2018-07-03 NOTE — Progress Notes (Signed)
Please refer to EMG and nerve conduction procedure note.  

## 2018-07-10 DIAGNOSIS — Z7984 Long term (current) use of oral hypoglycemic drugs: Secondary | ICD-10-CM | POA: Diagnosis not present

## 2018-07-10 DIAGNOSIS — E119 Type 2 diabetes mellitus without complications: Secondary | ICD-10-CM | POA: Diagnosis not present

## 2018-07-10 DIAGNOSIS — H25013 Cortical age-related cataract, bilateral: Secondary | ICD-10-CM | POA: Diagnosis not present

## 2018-07-10 DIAGNOSIS — H2513 Age-related nuclear cataract, bilateral: Secondary | ICD-10-CM | POA: Diagnosis not present

## 2018-07-10 LAB — HM DIABETES EYE EXAM

## 2018-07-12 ENCOUNTER — Encounter: Payer: Self-pay | Admitting: *Deleted

## 2018-07-15 ENCOUNTER — Other Ambulatory Visit: Payer: Self-pay | Admitting: Family Medicine

## 2018-07-16 ENCOUNTER — Other Ambulatory Visit: Payer: Self-pay | Admitting: *Deleted

## 2018-07-16 MED ORDER — OXYCODONE-ACETAMINOPHEN 10-325 MG PO TABS: 1.0000 | ORAL_TABLET | Freq: Three times a day (TID) | ORAL | 0 refills | Status: DC | PRN

## 2018-07-16 NOTE — Telephone Encounter (Signed)
Received call from patient.   Requested refill on Oxycodone/ APAP.   Ok to refill??  Last office visit 05/01/2018.  Last refill 06/17/2018.

## 2018-07-31 DIAGNOSIS — G4733 Obstructive sleep apnea (adult) (pediatric): Secondary | ICD-10-CM | POA: Diagnosis not present

## 2018-08-02 ENCOUNTER — Ambulatory Visit (INDEPENDENT_AMBULATORY_CARE_PROVIDER_SITE_OTHER): Payer: Medicare Other | Admitting: Family Medicine

## 2018-08-02 ENCOUNTER — Other Ambulatory Visit: Payer: Self-pay | Admitting: Internal Medicine

## 2018-08-02 ENCOUNTER — Encounter: Payer: Self-pay | Admitting: Family Medicine

## 2018-08-02 ENCOUNTER — Other Ambulatory Visit: Payer: Self-pay

## 2018-08-02 VITALS — BP 124/72 | HR 66 | Temp 98.4°F | Resp 12 | Ht 72.0 in | Wt 320.0 lb

## 2018-08-02 DIAGNOSIS — G5601 Carpal tunnel syndrome, right upper limb: Secondary | ICD-10-CM | POA: Diagnosis not present

## 2018-08-02 DIAGNOSIS — R0600 Dyspnea, unspecified: Secondary | ICD-10-CM

## 2018-08-02 DIAGNOSIS — M10071 Idiopathic gout, right ankle and foot: Secondary | ICD-10-CM

## 2018-08-02 DIAGNOSIS — E1169 Type 2 diabetes mellitus with other specified complication: Secondary | ICD-10-CM

## 2018-08-02 DIAGNOSIS — G5602 Carpal tunnel syndrome, left upper limb: Secondary | ICD-10-CM | POA: Diagnosis not present

## 2018-08-02 DIAGNOSIS — I2510 Atherosclerotic heart disease of native coronary artery without angina pectoris: Secondary | ICD-10-CM

## 2018-08-02 DIAGNOSIS — L600 Ingrowing nail: Secondary | ICD-10-CM

## 2018-08-02 DIAGNOSIS — E119 Type 2 diabetes mellitus without complications: Secondary | ICD-10-CM

## 2018-08-02 DIAGNOSIS — C61 Malignant neoplasm of prostate: Secondary | ICD-10-CM | POA: Diagnosis not present

## 2018-08-02 DIAGNOSIS — G894 Chronic pain syndrome: Secondary | ICD-10-CM

## 2018-08-02 DIAGNOSIS — I10 Essential (primary) hypertension: Secondary | ICD-10-CM

## 2018-08-02 DIAGNOSIS — I209 Angina pectoris, unspecified: Secondary | ICD-10-CM

## 2018-08-02 DIAGNOSIS — G4733 Obstructive sleep apnea (adult) (pediatric): Secondary | ICD-10-CM

## 2018-08-02 DIAGNOSIS — R634 Abnormal weight loss: Secondary | ICD-10-CM

## 2018-08-02 DIAGNOSIS — F5104 Psychophysiologic insomnia: Secondary | ICD-10-CM

## 2018-08-02 DIAGNOSIS — E669 Obesity, unspecified: Secondary | ICD-10-CM

## 2018-08-02 DIAGNOSIS — G5603 Carpal tunnel syndrome, bilateral upper limbs: Secondary | ICD-10-CM

## 2018-08-02 MED ORDER — OXYCODONE-ACETAMINOPHEN 10-325 MG PO TABS: 1.0000 | ORAL_TABLET | Freq: Three times a day (TID) | ORAL | 0 refills | Status: DC | PRN

## 2018-08-02 MED ORDER — DICLOFENAC SODIUM 1 % TD GEL: TRANSDERMAL | 3 refills | Status: DC

## 2018-08-02 NOTE — Progress Notes (Signed)
Subjective:    Patient ID: Peter Dunlap, male    DOB: 11/08/1955, 63 y.o.   MRN: [id number]  Patient presents for Follow-up (is not fasting)   Continued bilat hand pain and numbness. Had carpal tunnel diagnosed with carpal tunnel though mild bilat, still has left upper ext and shoulder pain. Hand goes numb more when he lifts it up  He has been taking his chronic pain medication which is also used  For his OA/DDD.Pain medication wears off, taking twice a day    Xrays OF c SPINE Showed Arhtiris in neck and concern for impingement, therefore he had CT of neck done, but no impingement seen, warranted the nerve conduction per above.  Using bengay/ has been on Gabapentin in the past but had side effects with that medication   Prostate cancer- told to take Rapaflo twice a day by urologist  but insurance will not cover    Seen by Dr. Gershon Crane for cataracts  Obesity- he has been working out on the bike, weight down 5lbs since December   PAF/HTN/ heart failure seen by Dr. Lovena Le- no changes, pacemaker was checked , no changes to lasix    DM- last A1C 6.2% in Sept 2019 , Dr. Dorris Fetch- follows his hypothryoidism/Diabetes    Gout- taking allopurinol daily, has only required a a couple doses of colchicine     OSA-using CPAP        Review Of Systems:  GEN- denies fatigue, fever, weight loss,weakness, recent illness HEENT- denies eye drainage, change in vision, nasal discharge, CVS- denies chest pain, palpitations RESP- denies SOB, cough, wheeze ABD- denies N/V, change in stools, abd pain GU- denies dysuria, hematuria, dribbling, incontinence MSK- denies joint pain, muscle aches, injury Neuro- denies headache, dizziness, syncope, seizure activity       Objective:    BP 124/72   Pulse 66   Temp 98.4 F (36.9 C) (Oral)   Resp 12   Ht 6' (1.829 m)   Wt (!) 320 lb (145.2 kg)   SpO2 97%   BMI 43.40 kg/m  GEN- NAD, alert and oriented x3 HEENT- PERRL, EOMI, non injected sclera,  pink conjunctiva, MMM, oropharynx clear Neck- Supple CVS- RRR, no murmur RESP-CTAB EXT- No edema Pulses- Radial, DP- 2+        Assessment & Plan:      Problem List Items Addressed This Visit      Unprioritized   CAD (coronary artery disease) - Primary    No current symptoms Blood pressure controlled Reviewed cardiology note       Carpal tunnel syndrome    Will give wrist braces to try Hold on neuropathic meds, has had SE with gabapentin He is on a lot of meds currently Increase percocet to TID dosing       Chronic insomnia    He states trazodone gave him weird nightmares  willnot start any new sleep medications He is going to try melatonin instead       Chronic pain syndrome   Diabetes mellitus type 2 in obese (HCC) (Chronic)    Unable to leave urine sample for urine micro meds and labs per endocrine, will also have lipid panel done       Relevant Orders   Microalbumin / creatinine urine ratio   HM DIABETES FOOT EXAM (Completed)   Essential hypertension    Well controlled no changes       Morbid obesity ([place])    Continue to work on dietary changes, needs  significant weight loss       OSA (obstructive sleep apnea)    Continues to benefit from CPAP use      Prostate cancer ([place])    Recommend he call his urologist about his rapaflo        Other Visit Diagnoses    Ingrown toenail       can soak with epson salt, use dental floss to lift up nail, do not cut back so far, hold on partial removal      Note: This dictation was prepared with Dragon dictation along with smaller phrase technology. Any transcriptional errors that result from this process are unintentional.

## 2018-08-02 NOTE — Patient Instructions (Addendum)
Percocet changed to 90 tablets a month Use the hand braces  Call your urologist  about your rapaflo prescription being twice day - [phone] F/U 6 months for MEDICARE PHYSICAL

## 2018-08-04 ENCOUNTER — Encounter: Payer: Self-pay | Admitting: Family Medicine

## 2018-08-04 NOTE — Assessment & Plan Note (Signed)
Will give wrist braces to try Hold on neuropathic meds, has had SE with gabapentin He is on a lot of meds currently Increase percocet to TID dosing

## 2018-08-04 NOTE — Assessment & Plan Note (Signed)
Well controlled no changes 

## 2018-08-04 NOTE — Assessment & Plan Note (Signed)
Continue to work on dietary changes, needs significant weight loss

## 2018-08-04 NOTE — Assessment & Plan Note (Signed)
Recommend he call his urologist about his rapaflo

## 2018-08-04 NOTE — Assessment & Plan Note (Signed)
No current symptoms Blood pressure controlled Reviewed cardiology note

## 2018-08-04 NOTE — Assessment & Plan Note (Signed)
Continues to benefit from CPAP use

## 2018-08-04 NOTE — Assessment & Plan Note (Signed)
He states trazodone gave him weird nightmares  willnot start any new sleep medications He is going to try melatonin instead

## 2018-08-04 NOTE — Assessment & Plan Note (Signed)
Unable to leave urine sample for urine micro meds and labs per endocrine, will also have lipid panel done

## 2018-08-13 ENCOUNTER — Ambulatory Visit (INDEPENDENT_AMBULATORY_CARE_PROVIDER_SITE_OTHER): Payer: Medicare Other | Admitting: *Deleted

## 2018-08-13 DIAGNOSIS — I495 Sick sinus syndrome: Secondary | ICD-10-CM | POA: Diagnosis not present

## 2018-08-14 LAB — CUP PACEART REMOTE DEVICE CHECK
Battery Remaining Longevity: 127 mo
Battery Remaining Percentage: 95.5 %
Battery Voltage: 2.99 V
Brady Statistic AP VP Percent: 1 %
Brady Statistic AP VS Percent: 8.4 %
Brady Statistic AS VP Percent: 1 %
Brady Statistic AS VS Percent: 91 %
Brady Statistic RA Percent Paced: 8.1 %
Brady Statistic RV Percent Paced: 1 %
Date Time Interrogation Session: 20200310060014
Implantable Lead Implant Date: 20160415
Implantable Lead Implant Date: 20160415
Implantable Lead Location: 753859
Implantable Lead Location: 753860
Implantable Pulse Generator Implant Date: 20160415
Lead Channel Impedance Value: 430 Ohm
Lead Channel Impedance Value: 560 Ohm
Lead Channel Pacing Threshold Amplitude: 0.75 V
Lead Channel Pacing Threshold Amplitude: 0.75 V
Lead Channel Pacing Threshold Pulse Width: 0.5 ms
Lead Channel Pacing Threshold Pulse Width: 0.5 ms
Lead Channel Sensing Intrinsic Amplitude: 12 mV
Lead Channel Sensing Intrinsic Amplitude: 5 mV
Lead Channel Setting Pacing Amplitude: 2 V
Lead Channel Setting Pacing Amplitude: 2.5 V
Lead Channel Setting Pacing Pulse Width: 0.5 ms
Lead Channel Setting Sensing Sensitivity: 2 mV
Pulse Gen Model: 2240
Pulse Gen Serial Number: 7756161

## 2018-08-15 DIAGNOSIS — E890 Postprocedural hypothyroidism: Secondary | ICD-10-CM | POA: Diagnosis not present

## 2018-08-15 DIAGNOSIS — E1169 Type 2 diabetes mellitus with other specified complication: Secondary | ICD-10-CM | POA: Diagnosis not present

## 2018-08-15 DIAGNOSIS — E559 Vitamin D deficiency, unspecified: Secondary | ICD-10-CM | POA: Diagnosis not present

## 2018-08-15 DIAGNOSIS — E782 Mixed hyperlipidemia: Secondary | ICD-10-CM | POA: Diagnosis not present

## 2018-08-16 LAB — HEMOGLOBIN A1C
Hgb A1c MFr Bld: 8.8 % of total Hgb — ABNORMAL HIGH (ref <5.7)
Mean Plasma Glucose: 206 (calc)
eAG (mmol/L): 11.4 (calc)

## 2018-08-16 LAB — COMPLETE METABOLIC PANEL WITH GFR
AG Ratio: 1.1 (calc) (ref 1.0–2.5)
ALT: 18 U/L (ref 9–46)
AST: 36 U/L — ABNORMAL HIGH (ref 10–35)
Albumin: 3.9 g/dL (ref 3.6–5.1)
Alkaline phosphatase (APISO): 109 U/L (ref 35–144)
BUN: 10 mg/dL (ref 7–25)
CO2: 27 mmol/L (ref 20–32)
Calcium: 9.4 mg/dL (ref 8.6–10.3)
Chloride: 102 mmol/L (ref 98–110)
Creat: 1.21 mg/dL (ref 0.70–1.25)
GFR, Est African American: 74 mL/min/{1.73_m2} (ref >=60)
GFR, Est Non African American: 64 mL/min/{1.73_m2} (ref >=60)
Globulin: 3.6 g/dL (calc) (ref 1.9–3.7)
Glucose, Bld: 252 mg/dL — ABNORMAL HIGH (ref 65–99)
Potassium: 3.9 mmol/L (ref 3.5–5.3)
Sodium: 138 mmol/L (ref 135–146)
Total Bilirubin: 0.9 mg/dL (ref 0.2–1.2)
Total Protein: 7.5 g/dL (ref 6.1–8.1)

## 2018-08-16 LAB — TSH: TSH: 5.05 mIU/L — ABNORMAL HIGH (ref 0.40–4.50)

## 2018-08-16 LAB — LIPID PANEL
Cholesterol: 117 mg/dL (ref <200)
HDL: 31 mg/dL — ABNORMAL LOW (ref >=40)
LDL Cholesterol (Calc): 61 mg/dL (calc)
Non-HDL Cholesterol (Calc): 86 mg/dL (calc) (ref <130)
Total CHOL/HDL Ratio: 3.8 (calc) (ref <5.0)
Triglycerides: 168 mg/dL — ABNORMAL HIGH (ref <150)

## 2018-08-16 LAB — VITAMIN D 25 HYDROXY (VIT D DEFICIENCY, FRACTURES): Vit D, 25-Hydroxy: 26 ng/mL — ABNORMAL LOW (ref 30–100)

## 2018-08-16 LAB — T4, FREE: Free T4: 1.5 ng/dL (ref 0.8–1.8)

## 2018-08-18 ENCOUNTER — Other Ambulatory Visit: Payer: Self-pay | Admitting: Internal Medicine

## 2018-08-18 ENCOUNTER — Other Ambulatory Visit: Payer: Self-pay | Admitting: "Endocrinology

## 2018-08-21 ENCOUNTER — Encounter: Payer: Self-pay | Admitting: Cardiology

## 2018-08-21 NOTE — Progress Notes (Signed)
Remote pacemaker transmission.   

## 2018-08-22 ENCOUNTER — Encounter: Payer: Self-pay | Admitting: "Endocrinology

## 2018-08-22 ENCOUNTER — Ambulatory Visit (INDEPENDENT_AMBULATORY_CARE_PROVIDER_SITE_OTHER): Payer: Medicare Other | Admitting: "Endocrinology

## 2018-08-22 ENCOUNTER — Other Ambulatory Visit: Payer: Self-pay

## 2018-08-22 VITALS — BP 138/80 | HR 69 | Ht 72.0 in | Wt 320.0 lb

## 2018-08-22 DIAGNOSIS — I10 Essential (primary) hypertension: Secondary | ICD-10-CM

## 2018-08-22 DIAGNOSIS — E782 Mixed hyperlipidemia: Secondary | ICD-10-CM | POA: Diagnosis not present

## 2018-08-22 DIAGNOSIS — E1165 Type 2 diabetes mellitus with hyperglycemia: Secondary | ICD-10-CM

## 2018-08-22 DIAGNOSIS — E890 Postprocedural hypothyroidism: Secondary | ICD-10-CM

## 2018-08-22 MED ORDER — GLIPIZIDE ER 2.5 MG PO TB24: 2.5000 mg | ORAL_TABLET | Freq: Every day | ORAL | 3 refills | Status: DC

## 2018-08-22 NOTE — Progress Notes (Signed)
Endocrinology follow-up note   Subjective:    Patient ID: Peter Dunlap, male    DOB: 1956/03/29, PCP [name], MD   Past Medical History:  Diagnosis Date  . Arteriosclerotic cardiovascular disease (ASCVD) 2005   catheterization in 10/2010:50% mid LAD, diffuse distal disease, circumflex irregularities, large dominant RCA with a 50% ostial, 70% distal, 60% posterolateral and 70% PDA; normal EF  . Arthritis   . Benign prostatic hypertrophy   . Bilateral carpal tunnel syndrome 07/03/2018  . Cerebrovascular disease 2010   R. carotid endarterectomy; Duplex in 10/2010-widely patent ICAs, subtotal left vertebral-not thought to be contributing to symptoms  . Cervical spine disease    CT in 2012-advanced degeneration and spondylosis with moderate spinal stenosis at C3-C6  . Depression   . Erectile dysfunction   . Gastroesophageal reflux disease   . H/O hiatal hernia   . H/O: substance abuse ([place])    Cocaine, marijuana, alcohol.  Quit 2013.   [name] Hyperlipidemia   . Hypertension   . Non-ST elevation myocardial infarction (NSTEMI), initial episode of care [facility]) 12/02/2013   DES LAD  . Obesity   . Prostate cancer ([place])   . Sleep apnea    CPAP  . Tachy-brady syndrome ([place])    a. s/p STJ dual chamber PPM   . Thyroid disease   . Tobacco abuse    Quit 2014  . 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 BRAIN SURGERY  2015   hematoma evacuation  . BURR HOLE Right 04/13/2014   Procedure: Haskell Flirt;  Surgeon: Charlie Pitter, MD;  Location: [place] NEURO ORS;  Service: Neurosurgery;  Laterality: Right;  . CAROTID ENDARTERECTOMY Right Feb. 25, 2010    CEA  . CORONARY ANGIOPLASTY WITH STENT PLACEMENT  12/03/2013   LAD 90%-->0% W/ Promus Premier DES 3.0 mm x 16 mm, CFX OK, RCA 40%, EF 70-75%  . LEFT ATRIAL APPENDAGE OCCLUSION N/A 08/05/2015   Procedure: LEFT ATRIAL APPENDAGE OCCLUSION;  Surgeon: [name], MD;  Location: [place] CV LAB;  Service: Cardiovascular;  Laterality: N/A;  . LEFT HEART CATHETERIZATION WITH CORONARY ANGIOGRAM Left 12/03/2013   Procedure: LEFT HEART CATHETERIZATION WITH CORONARY ANGIOGRAM;  Surgeon: Leonie Man, MD;  Location: [facility] CATH LAB;  Service: Cardiovascular;  Laterality: Left;  . LEFT HEART CATHETERIZATION WITH CORONARY ANGIOGRAM N/A 01/26/2014   Procedure: LEFT HEART CATHETERIZATION WITH CORONARY ANGIOGRAM;  Surgeon: Jettie Booze, MD;  Location: [facility] CATH LAB;  Service: Cardiovascular;  Laterality: N/A;  . LEFT HEART CATHETERIZATION WITH CORONARY ANGIOGRAM N/A 08/03/2014   Procedure: LEFT HEART CATHETERIZATION WITH CORONARY ANGIOGRAM;  Surgeon: Burnell Blanks, MD;  Location: [facility] CATH LAB;  Service: Cardiovascular;  Laterality: N/A;  . PERCUTANEOUS CORONARY STENT INTERVENTION (PCI-S)  12/03/2013   Procedure: PERCUTANEOUS CORONARY STENT INTERVENTION (PCI-S);  Surgeon: Leonie Man, MD;  Location: [facility] CATH LAB;  Service: Cardiovascular;;  . PERMANENT PACEMAKER INSERTION N/A 09/18/2014   Procedure: PERMANENT PACEMAKER INSERTION;  Surgeon: [name], MD;  Location: [facility] CATH LAB;  Service: Cardiovascular;  Laterality: N/A;  . RADIOFREQUENCY ABLATION  2005   for PSVT  . TEE WITHOUT CARDIOVERSION N/A 07/27/2015   Procedure: TRANSESOPHAGEAL ECHOCARDIOGRAM (TEE);  Surgeon: Lelon Perla, MD;  Location: [facility] ENDOSCOPY;  Service: Cardiovascular;  Laterality: N/A;  . TEE WITHOUT CARDIOVERSION N/A 09/15/2015   Procedure: TRANSESOPHAGEAL ECHOCARDIOGRAM (TEE);  Surgeon: Thayer Headings, MD;  Location: [place];  Service: Cardiovascular;  Laterality: N/A;   Social History   Socioeconomic History  . Marital status: Married    Spouse name: Not on file  . Number of children: 0  . Years of education: Not on file  . Highest education level: Not on file  Occupational History  . Occupation: Retired  Scientific laboratory technician  . Financial resource strain: Not on file  . Food insecurity:     Worry: Not on file    Inability: Not on file  . Transportation needs:    Medical: Not on file    Non-medical: Not on file  Tobacco Use  . Smoking status: Former Smoker    Packs/day: 1.00    Years: 40.00    Pack years: 40.00    Types: Cigarettes    Start date: 10/20/1972    Last attempt to quit: 10/10/2012    Years since quitting: 5.8  . Smokeless tobacco: Never Used  . Tobacco comment: Quit in May.   Substance and Sexual Activity  . Alcohol use: No    Alcohol/week: 0.0 standard drinks    Comment: former drinker-- sober since 2013.   . Drug use: No    Types: Cocaine    Comment: quit cocaine 10/2011  . Sexual activity: Yes    Partners: Female  Lifestyle  . Physical activity:    Days per week: Not on file    Minutes per session: Not on file  . Stress: Not on file  Relationships  . Social connections:    Talks on phone: Not on file    Gets together: Not on file    Attends religious service: Not on file    Active member of club or organization: Not on file    Attends meetings of clubs or organizations: Not on file    Relationship status: Not on file  Other Topics Concern  . Not on file  Social History Narrative   Lives in [place].   Outpatient Encounter Medications as of 08/22/2018  Medication Sig  . allopurinol (ZYLOPRIM) 100 MG tablet TAKE 1 TABLET BY MOUTH EVERY DAY  . aspirin EC 325 MG tablet Take 1 tablet (325 mg total) daily by mouth.  . Cholecalciferol (VITAMIN D3) 5000 units CAPS Take 1 capsule (5,000 Units total) by mouth daily.  . colchicine 0.6 MG tablet Take 1 tablet (0.6 mg total) by mouth daily.  [name] DEXILANT 60 MG capsule TAKE 1 CAPSULE BY MOUTH EVERY DAY  . diclofenac sodium (VOLTAREN) 1 % GEL Apply dime size to affected areas four times a day as needed  . furosemide (LASIX) 20 MG tablet TAKE 3 TABLETS BY MOUTH DAILY  . glipiZIDE (GLIPIZIDE XL) 2.5 MG 24 hr tablet Take 1 tablet (2.5 mg total) by mouth daily with breakfast.  . isosorbide mononitrate (IMDUR)  60 MG 24 hr tablet TAKE 1 TABLET BY MOUTH DAILY, PLEASE MAKE YEARLY APPT WITH DISCARD REMAINDER.[place] FOR DECEMBER FOR FUTURE REFILLS  . levothyroxine (SYNTHROID, LEVOTHROID) 200 MCG tablet TAKE 1 TABLET(200 MCG) BY MOUTH DAILY BEFORE BREAKFAST  . MAGNESIUM-OXIDE 400 (241.3 Mg) MG tablet TAKE 1 TABLET BY MOUTH TWICE DAILY  . metFORMIN (GLUCOPHAGE) 500 MG tablet TAKE 1 TABLET(500 MG) BY MOUTH TWICE DAILY WITH A MEAL  . nitroGLYCERIN (NITROSTAT) 0.4 MG SL tablet PLACE 1 TABLET UNDER THE TONGUE EVERY 5 MINUTES AS NEEDED FOR CHEST PAIN. CALL 911 AT THIRD DOSE IN 15 MINUTES  . ONE TOUCH ULTRA TEST test strip USE TO TEST ONCE DAILY  . oxyCODONE-acetaminophen (PERCOCET)  10-325 MG tablet Take 1 tablet by mouth every 8 (eight) hours as needed for pain.  . potassium chloride SA (K-DUR,KLOR-CON) 20 MEQ tablet Take 1 tablet (20 mEq total) by mouth daily.  [name] RAPAFLO 8 MG CAPS capsule TAKE 1 CAPSULE(8 MG) BY MOUTH DAILY  . simvastatin (ZOCOR) 40 MG tablet Take 1 tablet (40 mg total) by mouth daily at 6 PM.  . sotalol (BETAPACE) 160 MG tablet TAKE 1 TABLET(160 MG) BY MOUTH TWICE DAILY  . traZODone (DESYREL) 50 MG tablet TAKE 1/2 TO 1 TABLET BY MOUTH AT BEDTIME AS NEEDED FOR SLEEP   No facility-administered encounter medications on file as of 08/22/2018.    ALLERGIES: Allergies  Allergen Reactions  . Trazodone And Nefazodone     Nightmares  . Lactose Intolerance (Gi) Other (See Comments)    UPSET STOMACH    VACCINATION STATUS: Immunization History  Administered Date(s) Administered  . Influenza,inj,Quad PF,6+ Mos 02/17/2014, 03/09/2015, 03/10/2016, 03/27/2017, 03/20/2018  . Pneumococcal Polysaccharide-23 01/05/2014  . Tdap 12/19/2010    HPI  Peter Dunlap is a 63 year old male patient  with medical history as above.  -He is returning for follow-up of his RAI induced hypothyroidism, type 2 diabetes, hyperlipidemia, hypertension.     He was given RAI therapy on March 16, 2014 .  He is currently on  levothyroxine 200 mcg p.o. every morning.  He reports better compliance, continues to feel better.   -He denies cold intolerance, constipation, fatigue.  He has a steady body weight since last visit.   - He also has controlled type 2 diabetes on metformin.  His previsit labs show A1c of 6.2% improving from 6.7%.   He remains on metformin 500 mg p.o. twice daily.     He still admits to dietary indiscretion.    Pt denies family history of thyroid dysfunction . He denies personal history of goiter. he has hx of a-fib, and CAD which required stent placement x 2.   Review of Systems  Constitutional: + fluctuating body weight,  no subjective hyperthermia nor hypothermia.  Eyes: no blurry vision, no xerophthalmia ENT: no sore throat, no nodules palpated in throat, no dysphagia/odynophagia, no hoarseness Cardiovascular: No chest pain, no palpitations, no leg swelling.   Respiratory: No cough, no shortness of breath.   Gastrointestinal: no N/V/D/C Musculoskeletal:  complains of right knee pain and left foot pain from gout.  Skin: no rashes Neurological: No tremors, no numbness, no tingling.   Psychiatric: no depression/anxiety  Objective:    BP 138/80   Pulse 69   Ht 6' (1.829 m)   Wt (!) 320 lb (145.2 kg)   BMI 43.40 kg/m   Wt Readings from Last 3 Encounters:  08/22/18 (!) 320 lb (145.2 kg)  08/02/18 (!) 320 lb (145.2 kg)  05/22/18 (!) 325 lb (147.4 kg)    Physical Exam Constitutional:   + Obese, not in acute distress.  Normal state of mind.  Eyes: PERRLA, EOMI, no exophthalmos ENT: Moist mucous membranes, no thyromegaly.   Musculoskeletal:  strength intact in all 4 Skin: moist, warm, no rashes Neurological: no tremor with outstretched hands, DTR normal in all 4   Diabetic Labs (most recent): Lab Results  Component Value Date   HGBA1C 8.8 (H) 08/15/2018   HGBA1C 6.2 (H) 02/14/2018   HGBA1C 6.7 (H) 08/13/2017     Lipid Panel ( most recent) Lipid Panel     Component  Value Date/Time   CHOL 117 08/15/2018 0755   TRIG 168 (H) 08/15/2018  6067   HDL 31 (L) 08/15/2018 0755   CHOLHDL 3.8 08/15/2018 0755   VLDL 28 10/17/2016 0819   LDLCALC 61 08/15/2018 0755    Results for Peter Dunlap, Peter Dunlap (MRN [id number]) as of 08/22/2018 08:53  Ref. 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  -He is advised to continue levothyroxine 200 mcg p.o. daily before breakfast.   - We discussed about the correct intake of his thyroid hormone, on empty stomach at fasting, with water, separated by at least 30 minutes from breakfast and other medications,  and separated by more than 4 hours from calcium, iron, multivitamins, acid reflux medications (PPIs). -Patient is made aware of the fact that thyroid hormone replacement is needed for life, dose to be adjusted by periodic monitoring of thyroid function tests.  2. Diabetes mellitus type 2 in obese [facility]) -His previsit labs show A1c of 8.8% increasing from 6.2%.  He will need additional treatment besides metformin.  I discussed and added glipizide XL 2.5 mg daily at breakfast along with metformin 500 mg p.o. twice daily after breakfast and after supper.  He is advised to start monitoring blood glucose daily before breakfast.  - Patient admits there is a room for improvement in his diet and drink choices. -  Suggestion is made for him to avoid simple carbohydrates  from his diet including Cakes, Sweet Desserts / Pastries, Ice Cream, Soda (diet and regular), Sweet Tea, Candies, Chips, Cookies, Store Bought Juices, Alcohol in Excess of  1-2 drinks a day, Artificial Sweeteners, and  "Sugar-free" Products. This will help patient to have stable blood glucose profile and potentially avoid unintended weight gain.   3. Essential hypertension -His blood pressure is controlled to target.  He is advised to continue  his blood pressure medications including beta-blocker, Lasix.  4.  Hyperlipidemia: Controlled with LDL at 61 . -He is advised to continue with simvastatin 40 mg p.o. nightly.  - I advised patient to maintain close follow up with [name], MD for primary care needs.    Follow up plan: Return in about 3 months (around 11/22/2018) for Follow up with Pre-visit Labs, Meter, and Logs.  Glade Lloyd, MD Phone: [phone]  Fax: [phone]  This note was partially dictated with voice recognition software. Similar sounding words can be transcribed inadequately or may not  be corrected upon review.  08/22/2018, 10:08 AM

## 2018-08-22 NOTE — Patient Instructions (Signed)

## 2018-08-23 ENCOUNTER — Other Ambulatory Visit: Payer: Self-pay

## 2018-08-23 NOTE — Patient Outreach (Signed)
[place] [facility]) Care Management  08/23/2018  Peter Dunlap 1955/12/29 [id number]   Medication Adherence call to [name] spoke with patient he said he will call back patient is due on Simvastatin 40 mg under [place].   [place] Management Direct Dial [phone]  Fax [phone] [email or web address]

## 2018-08-28 ENCOUNTER — Other Ambulatory Visit: Payer: Self-pay | Admitting: *Deleted

## 2018-08-28 MED ORDER — OXYCODONE-ACETAMINOPHEN 10-325 MG PO TABS: 1.0000 | ORAL_TABLET | Freq: Three times a day (TID) | ORAL | 0 refills | Status: DC | PRN

## 2018-08-28 NOTE — Telephone Encounter (Signed)
Received call from patient.   Requested refill on Oxycodone/APAP.   Ok to refill??  Last office visit /refill 08/02/2018.

## 2018-09-05 ENCOUNTER — Telehealth: Payer: Self-pay | Admitting: *Deleted

## 2018-09-05 NOTE — Telephone Encounter (Signed)
Received request from pharmacy for [place] on Rapaflo.   Patient has tried and failed Flomax. Has been on Rapaflo since 10/2013.  PA submitted.   Dx: N40.0- BPH/ C61.0- prostate cancer.

## 2018-09-05 NOTE — Telephone Encounter (Signed)
Could not send PA via Cover My Meds.   Faxed paper form to [employer].

## 2018-09-10 MED ORDER — RAPAFLO 8 MG PO CAPS: ORAL_CAPSULE | ORAL | 2 refills | Status: DC

## 2018-09-10 NOTE — Telephone Encounter (Signed)
Received call from [facility] with [facility] appeals.   Advised that PA for Rapaflo approved.   Written confirmation to be mailed to office.   Pharmacy made aware.

## 2018-09-15 ENCOUNTER — Other Ambulatory Visit: Payer: Self-pay | Admitting: Family Medicine

## 2018-09-16 ENCOUNTER — Other Ambulatory Visit: Payer: Self-pay | Admitting: Family Medicine

## 2018-09-16 ENCOUNTER — Other Ambulatory Visit: Payer: Self-pay

## 2018-09-16 NOTE — Patient Outreach (Signed)
[place] [facility]) Care Management  09/16/2018  Peter Dunlap 1956/04/01 [id number]   Medication Adherence call to [name] spoke with patient he said he pick up a prescription from [facility] on 08/16/18 and is due again he ask if we can call [facility] and order this medication and test strip patient is showing past due under [place].   [place] Management Direct Dial [phone]  Fax [phone] [email or web address]

## 2018-09-30 ENCOUNTER — Other Ambulatory Visit: Payer: Self-pay | Admitting: *Deleted

## 2018-09-30 MED ORDER — OXYCODONE-ACETAMINOPHEN 10-325 MG PO TABS: 1.0000 | ORAL_TABLET | Freq: Three times a day (TID) | ORAL | 0 refills | Status: DC | PRN

## 2018-09-30 NOTE — Telephone Encounter (Signed)
Received call from patient.   Requested refill on Oxycodone/APAP.   Ok to refill??  Last office visit 08/02/2018.  Last refill 08/28/2018.

## 2018-10-04 ENCOUNTER — Other Ambulatory Visit: Payer: Self-pay | Admitting: Family Medicine

## 2018-10-15 ENCOUNTER — Ambulatory Visit (INDEPENDENT_AMBULATORY_CARE_PROVIDER_SITE_OTHER): Payer: Medicare Other | Admitting: Urology

## 2018-10-15 ENCOUNTER — Other Ambulatory Visit (HOSPITAL_COMMUNITY)
Admission: RE | Admit: 2018-10-15 | Discharge: 2018-10-15 | Disposition: A | Discharge status: Home / Self Care | Payer: Medicare Other | Source: Other Acute Inpatient Hospital | Attending: Urology | Admitting: Urology

## 2018-10-15 DIAGNOSIS — C61 Malignant neoplasm of prostate: Secondary | ICD-10-CM | POA: Diagnosis not present

## 2018-10-15 DIAGNOSIS — R310 Gross hematuria: Secondary | ICD-10-CM | POA: Diagnosis not present

## 2018-10-15 DIAGNOSIS — N401 Enlarged prostate with lower urinary tract symptoms: Secondary | ICD-10-CM

## 2018-10-15 DIAGNOSIS — R350 Frequency of micturition: Secondary | ICD-10-CM | POA: Diagnosis not present

## 2018-10-16 LAB — URINE CULTURE: Culture: NO GROWTH

## 2018-10-21 ENCOUNTER — Other Ambulatory Visit: Payer: Self-pay | Admitting: Family Medicine

## 2018-10-30 ENCOUNTER — Other Ambulatory Visit: Payer: Self-pay

## 2018-10-30 MED ORDER — OXYCODONE-ACETAMINOPHEN 10-325 MG PO TABS: 1.0000 | ORAL_TABLET | Freq: Three times a day (TID) | ORAL | 0 refills | Status: DC | PRN

## 2018-10-30 NOTE — Telephone Encounter (Signed)
Requested Prescriptions   Pending Prescriptions Disp Refills  . oxyCODONE-acetaminophen (PERCOCET) 10-325 MG tablet 90 tablet 0    Sig: Take 1 tablet by mouth every 8 (eight) hours as needed for pain.   Last OV 08/02/2018 Last filled 09/30/2018

## 2018-10-31 DIAGNOSIS — G4733 Obstructive sleep apnea (adult) (pediatric): Secondary | ICD-10-CM | POA: Diagnosis not present

## 2018-11-12 ENCOUNTER — Ambulatory Visit (INDEPENDENT_AMBULATORY_CARE_PROVIDER_SITE_OTHER): Payer: Medicare Other | Admitting: *Deleted

## 2018-11-12 DIAGNOSIS — I495 Sick sinus syndrome: Secondary | ICD-10-CM

## 2018-11-12 DIAGNOSIS — I480 Paroxysmal atrial fibrillation: Secondary | ICD-10-CM

## 2018-11-12 LAB — CUP PACEART REMOTE DEVICE CHECK
Battery Remaining Longevity: 127 mo
Battery Remaining Percentage: 95.5 %
Battery Voltage: 2.98 V
Brady Statistic AP VP Percent: 1 %
Brady Statistic AP VS Percent: 5.9 %
Brady Statistic AS VP Percent: 1 %
Brady Statistic AS VS Percent: 94 %
Brady Statistic RA Percent Paced: 5.7 %
Brady Statistic RV Percent Paced: 1 %
Date Time Interrogation Session: 20200609060015
Implantable Lead Implant Date: 20160415
Implantable Lead Implant Date: 20160415
Implantable Lead Location: 753859
Implantable Lead Location: 753860
Implantable Pulse Generator Implant Date: 20160415
Lead Channel Impedance Value: 430 Ohm
Lead Channel Impedance Value: 560 Ohm
Lead Channel Pacing Threshold Amplitude: 0.75 V
Lead Channel Pacing Threshold Amplitude: 0.75 V
Lead Channel Pacing Threshold Pulse Width: 0.5 ms
Lead Channel Pacing Threshold Pulse Width: 0.5 ms
Lead Channel Sensing Intrinsic Amplitude: 12 mV
Lead Channel Sensing Intrinsic Amplitude: 5 mV
Lead Channel Setting Pacing Amplitude: 2 V
Lead Channel Setting Pacing Amplitude: 2.5 V
Lead Channel Setting Pacing Pulse Width: 0.5 ms
Lead Channel Setting Sensing Sensitivity: 2 mV
Pulse Gen Model: 2240
Pulse Gen Serial Number: 7756161

## 2018-11-14 DIAGNOSIS — E890 Postprocedural hypothyroidism: Secondary | ICD-10-CM | POA: Diagnosis not present

## 2018-11-14 DIAGNOSIS — E1165 Type 2 diabetes mellitus with hyperglycemia: Secondary | ICD-10-CM | POA: Diagnosis not present

## 2018-11-15 LAB — COMPLETE METABOLIC PANEL WITH GFR
AG Ratio: 1.3 (calc) (ref 1.0–2.5)
ALT: 12 U/L (ref 9–46)
AST: 22 U/L (ref 10–35)
Albumin: 4 g/dL (ref 3.6–5.1)
Alkaline phosphatase (APISO): 73 U/L (ref 35–144)
BUN: 9 mg/dL (ref 7–25)
CO2: 27 mmol/L (ref 20–32)
Calcium: 9 mg/dL (ref 8.6–10.3)
Chloride: 103 mmol/L (ref 98–110)
Creat: 1 mg/dL (ref 0.70–1.25)
GFR, Est African American: 92 mL/min/{1.73_m2} (ref >=60)
GFR, Est Non African American: 80 mL/min/{1.73_m2} (ref >=60)
Globulin: 3.2 g/dL (calc) (ref 1.9–3.7)
Glucose, Bld: 103 mg/dL — ABNORMAL HIGH (ref 65–99)
Potassium: 4.1 mmol/L (ref 3.5–5.3)
Sodium: 139 mmol/L (ref 135–146)
Total Bilirubin: 0.8 mg/dL (ref 0.2–1.2)
Total Protein: 7.2 g/dL (ref 6.1–8.1)

## 2018-11-15 LAB — TSH: TSH: 1.31 mIU/L (ref 0.40–4.50)

## 2018-11-15 LAB — HEMOGLOBIN A1C
Hgb A1c MFr Bld: 6.2 % of total Hgb — ABNORMAL HIGH (ref <5.7)
Mean Plasma Glucose: 131 (calc)
eAG (mmol/L): 7.3 (calc)

## 2018-11-15 LAB — T4, FREE: Free T4: 1.5 ng/dL (ref 0.8–1.8)

## 2018-11-16 ENCOUNTER — Other Ambulatory Visit: Payer: Self-pay | Admitting: Internal Medicine

## 2018-11-16 ENCOUNTER — Other Ambulatory Visit: Payer: Self-pay | Admitting: Family Medicine

## 2018-11-21 ENCOUNTER — Ambulatory Visit (INDEPENDENT_AMBULATORY_CARE_PROVIDER_SITE_OTHER): Payer: Medicare Other | Admitting: "Endocrinology

## 2018-11-21 ENCOUNTER — Other Ambulatory Visit: Payer: Self-pay

## 2018-11-21 ENCOUNTER — Encounter (INDEPENDENT_AMBULATORY_CARE_PROVIDER_SITE_OTHER): Payer: Self-pay

## 2018-11-21 ENCOUNTER — Encounter: Payer: Self-pay | Admitting: "Endocrinology

## 2018-11-21 VITALS — BP 119/75 | HR 71 | Ht 72.0 in | Wt 315.4 lb

## 2018-11-21 DIAGNOSIS — E782 Mixed hyperlipidemia: Secondary | ICD-10-CM | POA: Diagnosis not present

## 2018-11-21 DIAGNOSIS — I10 Essential (primary) hypertension: Secondary | ICD-10-CM | POA: Diagnosis not present

## 2018-11-21 DIAGNOSIS — E1165 Type 2 diabetes mellitus with hyperglycemia: Secondary | ICD-10-CM

## 2018-11-21 DIAGNOSIS — E890 Postprocedural hypothyroidism: Secondary | ICD-10-CM

## 2018-11-21 NOTE — Progress Notes (Signed)
Endocrinology follow-up note   Subjective:    Patient ID: Peter Dunlap, male    DOB: August 20, 1955, PCP [name], MD   Past Medical History:  Diagnosis Date   Arteriosclerotic cardiovascular disease (ASCVD) 2005   catheterization in 10/2010:50% mid LAD, diffuse distal disease, circumflex irregularities, large dominant RCA with a 50% ostial, 70% distal, 60% posterolateral and 70% PDA; normal EF   Arthritis    Benign prostatic hypertrophy    Bilateral carpal tunnel syndrome 07/03/2018   Cerebrovascular disease 2010   R. carotid endarterectomy; Duplex in 10/2010-widely patent ICAs, subtotal left vertebral-not thought to be contributing to symptoms   Cervical spine disease    CT in 2012-advanced degeneration and spondylosis with moderate spinal stenosis at C3-C6   Depression    Erectile dysfunction    Gastroesophageal reflux disease    H/O hiatal hernia    H/O: substance abuse ([place])    Cocaine, marijuana, alcohol.  Quit 2013.    Hyperlipidemia    Hypertension    Non-ST elevation myocardial infarction (NSTEMI), initial episode of care ([place]) 12/02/2013   DES LAD   Obesity    Prostate cancer ([place])    Sleep apnea    CPAP   Tachy-brady syndrome ([place])    a. s/p STJ dual chamber PPM    Thyroid disease    Tobacco abuse    Quit 2014   Ulnar neuropathy at elbow 07/03/2018   Bilateral   Past Surgical History:  Procedure Laterality Date   BRAIN SURGERY  2015   hematoma evacuation   BURR HOLE Right 04/13/2014   Procedure: Haskell Flirt;  Surgeon: Charlie Pitter, MD;  Location: [facility];  Service: Neurosurgery;  Laterality: Right;   CAROTID ENDARTERECTOMY Right Feb. 25, 2010    CEA   CORONARY ANGIOPLASTY WITH STENT PLACEMENT  12/03/2013   LAD 90%-->0% W/ Promus Premier DES 3.0 mm x 16 mm, CFX OK, RCA 40%, EF 70-75%   LEFT ATRIAL APPENDAGE OCCLUSION N/A 08/05/2015   Procedure: LEFT ATRIAL APPENDAGE OCCLUSION;  Surgeon: [name], MD;  Location: [facility];  Service: Cardiovascular;  Laterality: N/A;   LEFT HEART CATHETERIZATION WITH CORONARY ANGIOGRAM Left 12/03/2013   Procedure: LEFT HEART CATHETERIZATION WITH CORONARY ANGIOGRAM;  Surgeon: Leonie Man, MD;  Location: [facility] CATH LAB;  Service: Cardiovascular;  Laterality: Left;   LEFT HEART CATHETERIZATION WITH CORONARY ANGIOGRAM N/A 01/26/2014   Procedure: LEFT HEART CATHETERIZATION WITH CORONARY ANGIOGRAM;  Surgeon: Jettie Booze, MD;  Location: [facility] C[facility] LAB;  Service: Cardiovascular;  Laterality: N/A;   LEFT HEART CATHETERIZATION WITH CORONARY ANGIOGRAM N/A 08/03/2014   Procedure: LEFT HEART CATHETERIZATION WITH CORONARY ANGIOGRAM;  Surgeon: Burnell Blanks, MD;  Location: [facility] CATH LAB;  Service: Cardiovascular;  Laterality: N/A;   PERCUTANEOUS CORONARY STENT INTERVENTION (PCI-S)  12/03/2013   Procedure: PERCUTANEOUS CORONARY STENT INTERVENTION (PCI-S);  Surgeon: Leonie Man, MD;  Location: [facility] CATH LAB;  Service: Cardiovascular;;   PERMANENT PACEMAKER INSERTION N/A 09/18/2014   Procedure: PERMANENT PACEMAKER INSERTION;  Surgeon: [name], MD;  Location: [facility] CATH LAB;  Service: Cardiovascular;  Laterality: N/A;   RADIOFREQUENCY ABLATION  2005   for PSVT   TEE WITHOUT CARDIOVERSION N/A 07/27/2015   Procedure: TRANSESOPHAGEAL ECHOCARDIOGRAM (TEE);  Surgeon: Lelon Perla, MD;  Location: [facility] ENDOSCOPY;  Service: Cardiovascular;  Laterality: N/A;   TEE WITHOUT CARDIOVERSION N/A 09/15/2015   Procedure: TRANSESOPHAGEAL ECHOCARDIOGRAM (TEE);  Surgeon: Thayer Headings, MD;  Location: [facility];  Service: Cardiovascular;  Laterality: N/A;   Social History   Socioeconomic History   Marital status: Married    Spouse name: Not on file   Number of children: 0   Years of education: Not on file   Highest education level: Not on file  Occupational History   Occupation: Retired  Scientist, product/process development strain: Not on file   Food insecurity     Worry: Not on file    Inability: Not on file   Transportation needs    Medical: Not on file    Non-medical: Not on file  Tobacco Use   Smoking status: Former Smoker    Packs/day: 1.00    Years: 40.00    Pack years: 40.00    Types: Cigarettes    Start date: 10/20/1972    Quit date: 10/10/2012    Years since quitting: 6.1   Smokeless tobacco: Never Used   Tobacco comment: Quit in May.   Substance and Sexual Activity   Alcohol use: No    Alcohol/week: 0.0 standard drinks    Comment: former drinker-- sober since 2013.    Drug use: No    Types: Cocaine    Comment: quit cocaine 10/2011   Sexual activity: Yes    Partners: Female  Lifestyle   Physical activity    Days per week: Not on file    Minutes per session: Not on file   Stress: Not on file  Relationships   Social connections    Talks on phone: Not on file    Gets together: Not on file    Attends religious service: Not on file    Active member of club or organization: Not on file    Attends meetings of clubs or organizations: Not on file    Relationship status: Not on file  Other Topics Concern   Not on file  Social History Narrative   Lives in [place].   Outpatient Encounter Medications as of 11/21/2018  Medication Sig   allopurinol (ZYLOPRIM) 100 MG tablet TAKE 1 TABLET BY MOUTH EVERY DAY   aspirin EC 325 MG tablet Take 1 tablet (325 mg total) daily by mouth.   Cholecalciferol (VITAMIN D3) 5000 units CAPS Take 1 capsule (5,000 Units total) by mouth daily.   colchicine 0.6 MG tablet Take 1 tablet (0.6 mg total) by mouth daily.   DEXILANT 60 MG capsule TAKE 1 CAPSULE BY MOUTH EVERY DAY   diclofenac sodium (VOLTAREN) 1 % GEL Apply dime size to affected areas four times a day as needed   furosemide (LASIX) 20 MG tablet TAKE 3 TABLETS BY MOUTH DAILY   glipiZIDE (GLIPIZIDE XL) 2.5 MG 24 hr tablet Take 1 tablet (2.5 mg total) by mouth daily with breakfast.   isosorbide mononitrate (IMDUR) 60 MG 24 hr  tablet TAKE 1 TABLET BY MOUTH DAILY, PLEASE MAKE YEARLY APPT WITH DISCARD REMAINDER.[place] FOR DECEMBER FOR FUTURE REFILLS   levothyroxine (SYNTHROID, LEVOTHROID) 200 MCG tablet TAKE 1 TABLET(200 MCG) BY MOUTH DAILY BEFORE BREAKFAST   Magnesium Oxide 400 (240 Mg) MG TABS TAKE 1 TABLET BY MOUTH TWICE DAILY   metFORMIN (GLUCOPHAGE) 500 MG tablet TAKE 1 TABLET(500 MG) BY MOUTH TWICE DAILY WITH A MEAL   nitroGLYCERIN (NITROSTAT) 0.4 MG SL tablet PLACE 1 TABLET UNDER THE TONGUE EVERY 5 MINUTES AS NEEDED FOR CHEST PAIN. CALL 911 AT THIRD DOSE IN 15 MINUTES   ONE TOUCH ULTRA TEST test strip USE TO TEST ONCE DAILY   oxyCODONE-acetaminophen (PERCOCET) 10-325  MG tablet Take 1 tablet by mouth every 8 (eight) hours as needed for pain.   potassium chloride SA (K-DUR,KLOR-CON) 20 MEQ tablet Take 1 tablet (20 mEq total) by mouth daily.   RAPAFLO 8 MG CAPS capsule TAKE 1 CAPSULE(8 MG) BY MOUTH DAILY   simvastatin (ZOCOR) 40 MG tablet TAKE 1 TABLET(40 MG) BY MOUTH DAILY   sotalol (BETAPACE) 160 MG tablet TAKE 1 TABLET(160 MG) BY MOUTH TWICE DAILY   traZODone (DESYREL) 50 MG tablet TAKE 1/2 TO 1 TABLET BY MOUTH AT BEDTIME AS NEEDED FOR SLEEP   No facility-administered encounter medications on file as of 11/21/2018.    ALLERGIES: Allergies  Allergen Reactions   Trazodone And Nefazodone     Nightmares   Lactose Intolerance (Gi) Other (See Comments)    UPSET STOMACH    VACCINATION STATUS: Immunization History  Administered Date(s) Administered   Influenza,inj,Quad PF,6+ Mos 02/17/2014, 03/09/2015, 03/10/2016, 03/27/2017, 03/20/2018   Pneumococcal Polysaccharide-23 01/05/2014   Tdap 12/19/2010    HPI  Mr. Werts is a 63 year old male patient  with medical history as above.  -He is returning for follow-up of his RAI induced hypothyroidism, type 2 diabetes, hyperlipidemia, hypertension.     He was given RAI therapy on March 16, 2014 .  He is currently on levothyroxine 200 mcg p.o.  every morning.  He reports better compliance, continues to feel better.  -He denies cold intolerance, constipation, fatigue.  He has a steady body weight since last visit.   - He also has controlled type 2 diabetes on metformin.  His previsit labs show A1c of 6.2% improving from 6.7%.   He remains on metformin 500 mg p.o. twice daily, and glipizide 2.5 mg p.o. daily.      He still admits to dietary indiscretion.    Pt denies family history of thyroid dysfunction . He denies personal history of goiter. he has hx of a-fib, and CAD which required stent placement x 2.   Review of Systems  Constitutional: + lost weight,  no subjective hyperthermia nor hypothermia.  Eyes: no blurry vision, no xerophthalmia ENT: no sore throat, no nodules palpated in throat, no dysphagia/odynophagia, no hoarseness  Musculoskeletal:  complains of right knee pain and left foot pain from gout.  Skin: no rashes Neurological: No tremors, no numbness, no tingling.   Psychiatric: no depression/anxiety  Objective:    BP 119/75    Pulse 71    Ht 6' (1.829 m)    Wt (!) 315 lb 6.4 oz (143.1 kg)    SpO2 99%    BMI 42.78 kg/m   Wt Readings from Last 3 Encounters:  11/21/18 (!) 315 lb 6.4 oz (143.1 kg)  08/22/18 (!) 320 lb (145.2 kg)  08/02/18 (!) 320 lb (145.2 kg)    Physical Exam Constitutional:   + Obese, not in acute distress.  Normal state of mind.   Eyes:  EOMI, no exophthalmos Neck: Supple Respiratory: Adequate breathing efforts Musculoskeletal: no gross deformities, strength intact in all four extremities Skin:  no rashes, no hyperemia Neurological: no tremor with outstretched hands.   Diabetic Labs (most recent): Lab Results  Component Value Date   HGBA1C 6.2 (H) 11/14/2018   HGBA1C 8.8 (H) 08/15/2018   HGBA1C 6.2 (H) 02/14/2018     Lipid Panel ( most recent) Lipid Panel     Component Value Date/Time   CHOL 117 08/15/2018 0755   TRIG 168 (H) 08/15/2018 0755   HDL 31 (L) 08/15/2018 0755    CHOLHDL 3.8  08/15/2018 0755   VLDL 28 10/17/2016 0819   LDLCALC 61 08/15/2018 0755    Recent Results (from the past 2160 hour(s))  Urine Culture     Status: None   Collection Time: 10/15/18 11:40 AM   Specimen: Urine, Clean Catch  Result Value Ref Range   Specimen Description      URINE, CLEAN CATCH Performed at [facility], [address], [place], [place] [address]    Special Requests      NONE Performed at [facility], [address], [place], [place] [address]    Culture      NO GROWTH Performed at [facility], [place] [address], [place], [place] [address]    Report Status 10/16/2018 FINAL   CUP PACEART REMOTE DEVICE CHECK     Status: None   Collection Time: 11/12/18  6:00 AM  Result Value Ref Range   Date Time Interrogation Session [id number]    Pulse Generator Manufacturer [employer]    Pulse Gen Model 2240 Assurity    Pulse Gen Serial Number [id number]    Clinic Name [facility]    Implantable Pulse Generator Type Implantable Pulse Generator    Implantable Pulse Generator Implant Date 62703500    Implantable Lead Manufacturer [facility]    Implantable Lead Model 1688TC Tendril SDX    Implantable Lead Serial Number [id number]    Implantable Lead Implant Date 71696789    Implantable Lead Location Detail 1 UNKNOWN    Implantable Lead Location [id number]    Implantable Lead Manufacturer [facility]    Implantable Lead Model 1888TC Tendril ST Optim    Implantable Lead Serial Number [id number]    Implantable Lead Implant Date 38101751    Implantable Lead Location Detail 1 UNKNOWN    Implantable Lead Location [id number]    Lead Channel Setting Sensing Sensitivity 2.0 mV   Lead Channel Setting Sensing Adaptation Mode Fixed Pacing    Lead Channel Setting Pacing Amplitude 2.0 V   Lead Channel Setting Pacing Pulse Width 0.5 ms   Lead Channel Setting Pacing Amplitude 2.5 V   Lead Channel Status     Lead Channel Impedance Value 430 ohm   Lead Channel Sensing Intrinsic Amplitude 5.0 mV    Lead Channel Pacing Threshold Amplitude 0.75 V   Lead Channel Pacing Threshold Pulse Width 0.5 ms   Lead Channel Status     Lead Channel Impedance Value 560 ohm   Lead Channel Sensing Intrinsic Amplitude 12.0 mV   Lead Channel Pacing Threshold Amplitude 0.75 V   Lead Channel Pacing Threshold Pulse Width 0.5 ms   Battery Status MOS    Battery Remaining Longevity 127 mo   Battery Remaining Percentage 95.5 %   Battery Voltage 2.98 V   Brady Statistic RA Percent Paced 5.7 %   Brady Statistic RV Percent Paced 1.0 %   Brady Statistic AP VP Percent 1.0 %   Brady Statistic AS VP Percent 1.0 %   Brady Statistic AP VS Percent 5.9 %   Brady Statistic AS VS Percent 94.0 %  Hemoglobin A1c     Status: Abnormal   Collection Time: 11/14/18  7:23 AM  Result Value Ref Range   Hgb A1c MFr Bld 6.2 (H) <5.7 % of total Hgb    Comment: For someone without known diabetes, a hemoglobin  A1c value between 5.7% and 6.4% is consistent with prediabetes and should be confirmed with a  follow-up test. . For someone with known diabetes, a value <7% indicates that their  diabetes is well controlled. A1c targets should be individualized based on duration of diabetes, age, comorbid conditions, and other considerations. . This assay result is consistent with an increased risk of diabetes. . Currently, no consensus exists regarding use of hemoglobin A1c for diagnosis of diabetes for children. .    Mean Plasma Glucose 131 (calc)   eAG (mmol/L) 7.3 (calc)  COMPLETE METABOLIC PANEL WITH GFR     Status: Abnormal   Collection Time: 11/14/18  7:23 AM  Result Value Ref Range   Glucose, Bld 103 (H) 65 - 99 mg/dL    Comment: .            Fasting reference interval . For someone without known diabetes, a glucose value between 100 and 125 mg/dL is consistent with prediabetes and should be confirmed with a follow-up test. .    BUN 9 7 - 25 mg/dL   Creat 1.00 0.70 - 1.25 mg/dL    Comment: For patients >83  years of age, the reference limit for Creatinine is approximately 13% higher for people identified as African-American. .    GFR, Est Non African American 80 > OR = 60 mL/min/1.11m2   GFR, Est African American 92 > OR = 60 mL/min/1.57m2   BUN/Creatinine Ratio NOT APPLICABLE 6 - 22 (calc)   Sodium 139 135 - 146 mmol/L   Potassium 4.1 3.5 - 5.3 mmol/L   Chloride 103 98 - 110 mmol/L   CO2 27 20 - 32 mmol/L   Calcium 9.0 8.6 - 10.3 mg/dL   Total Protein 7.2 6.1 - 8.1 g/dL   Albumin 4.0 3.6 - 5.1 g/dL   Globulin 3.2 1.9 - 3.7 g/dL (calc)   AG Ratio 1.3 1.0 - 2.5 (calc)   Total Bilirubin 0.8 0.2 - 1.2 mg/dL   Alkaline phosphatase (APISO) 73 35 - 144 U/L   AST 22 10 - 35 U/L   ALT 12 9 - 46 U/L  TSH     Status: None   Collection Time: 11/14/18  7:23 AM  Result Value Ref Range   TSH 1.31 0.40 - 4.50 mIU/L  T4, free     Status: None   Collection Time: 11/14/18  7:23 AM  Result Value Ref Range   Free T4 1.5 0.8 - 1.8 ng/dL     Assessment & Plan:   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  He is advised to continue levothyroxine 200 mcg p.o. daily before breakfast.     - We discussed about the correct intake of his thyroid hormone, on empty stomach at fasting, with water, separated by at least 30 minutes from breakfast and other medications,  and separated by more than 4 hours from calcium, iron, multivitamins, acid reflux medications (PPIs). -Patient is made aware of the fact that thyroid hormone replacement is needed for life, dose to be adjusted by periodic monitoring of thyroid function tests.   2. Diabetes mellitus type 2 in obese ([place]) -His previsit labs show A1c 6.2% improving from 8.8%.   -He will continue on his current medications including metformin 500 mg p.o. twice daily after breakfast and supper, and glipizide XL 2.5 mg p.o. daily at breakfast.     He is advised to start monitoring blood glucose daily  before breakfast.  - he  admits there is a room for improvement in his diet and drink choices. -  Suggestion is made for him to avoid simple carbohydrates  from his  diet including Cakes, Sweet Desserts / Pastries, Ice Cream, Soda (diet and regular), Sweet Tea, Candies, Chips, Cookies, Sweet Pastries,  Store Bought Juices, Alcohol in Excess of  1-2 drinks a day, Artificial Sweeteners, Coffee Creamer, and "Sugar-free" Products. This will help patient to have stable blood glucose profile and potentially avoid unintended weight gain.   3. Essential hypertension -His blood pressure is controlled to target.  He is advised to continue  his blood pressure medications including beta-blocker, Lasix.  4.  Hyperlipidemia: Controlled with LDL at 61 . -He is advised to continue simvastatin 40 mg p.o. nightly.  Side effects and precautions discussed with him.    - I advised patient to maintain close follow up with [name], MD for primary care needs.   - Time spent with the patient: 25 min, of which >50% was spent in reviewing his  current and  previous labs/studies, previous treatments, and medications doses and developing a plan for long-term care based on the latest recommendations for standards of care. Please refer to " Patient Self Inventory" in the Media  tab for reviewed elements of pertinent patient history. Peter Dunlap participated in the discussions, expressed understanding, and voiced agreement with the above plans.  All questions were answered to his satisfaction. he is encouraged to contact clinic should he have any questions or concerns prior to his return visit.  Follow up plan: Return in about 6 months (around 05/23/2019) for Follow up with Pre-visit Labs.  Glade Lloyd, MD Phone: [phone]  Fax: [phone]  This note was partially dictated with voice recognition software. Similar sounding words can be transcribed inadequately or may not  be corrected upon  review.  11/21/2018, 1:16 PM

## 2018-11-21 NOTE — Patient Instructions (Signed)

## 2018-11-22 NOTE — Progress Notes (Signed)
Remote pacemaker transmission.   

## 2018-11-29 ENCOUNTER — Other Ambulatory Visit: Payer: Self-pay | Admitting: *Deleted

## 2018-11-29 MED ORDER — OXYCODONE-ACETAMINOPHEN 10-325 MG PO TABS: 1.0000 | ORAL_TABLET | Freq: Three times a day (TID) | ORAL | 0 refills | Status: DC | PRN

## 2018-11-29 NOTE — Telephone Encounter (Signed)
Received call from patient.   Requested refill on Oxycodone/APAP.  Ok to refill??  Last office visit 08/02/2018.  Last refill 10/30/2018.

## 2018-12-16 ENCOUNTER — Other Ambulatory Visit: Payer: Self-pay | Admitting: Internal Medicine

## 2018-12-16 ENCOUNTER — Other Ambulatory Visit: Payer: Self-pay | Admitting: Family Medicine

## 2018-12-20 ENCOUNTER — Other Ambulatory Visit: Payer: Self-pay | Admitting: Internal Medicine

## 2018-12-24 ENCOUNTER — Other Ambulatory Visit: Payer: Self-pay

## 2018-12-24 MED ORDER — GLIPIZIDE ER 2.5 MG PO TB24: 2.5000 mg | ORAL_TABLET | Freq: Every day | ORAL | 3 refills | Status: DC

## 2018-12-30 ENCOUNTER — Other Ambulatory Visit: Payer: Self-pay | Admitting: *Deleted

## 2018-12-30 MED ORDER — OXYCODONE-ACETAMINOPHEN 10-325 MG PO TABS: 1.0000 | ORAL_TABLET | Freq: Three times a day (TID) | ORAL | 0 refills | Status: DC | PRN

## 2018-12-30 NOTE — Telephone Encounter (Signed)
Received call from patient.   Requested refill on Oxycodone/APAP.  Ok to refill??  Last office visit   Last refill 11/29/2018.

## 2019-01-15 ENCOUNTER — Other Ambulatory Visit: Payer: Self-pay | Admitting: Family Medicine

## 2019-01-29 ENCOUNTER — Other Ambulatory Visit: Payer: Self-pay | Admitting: *Deleted

## 2019-01-29 DIAGNOSIS — G4733 Obstructive sleep apnea (adult) (pediatric): Secondary | ICD-10-CM | POA: Diagnosis not present

## 2019-01-29 MED ORDER — OXYCODONE-ACETAMINOPHEN 10-325 MG PO TABS: 1.0000 | ORAL_TABLET | Freq: Three times a day (TID) | ORAL | 0 refills | Status: DC | PRN

## 2019-01-29 NOTE — Telephone Encounter (Signed)
Received call from patient.   Requested refill on Oxycodone/APAP.   Ok to refill??  Last office visit 08/02/2018.  Last refill 12/30/2018.

## 2019-02-12 ENCOUNTER — Ambulatory Visit (INDEPENDENT_AMBULATORY_CARE_PROVIDER_SITE_OTHER): Payer: Medicare Other | Admitting: *Deleted

## 2019-02-12 DIAGNOSIS — I495 Sick sinus syndrome: Secondary | ICD-10-CM

## 2019-02-13 LAB — CUP PACEART REMOTE DEVICE CHECK
Battery Remaining Longevity: 127 mo
Battery Remaining Percentage: 95.5 %
Battery Voltage: 2.98 V
Brady Statistic AP VP Percent: 1 %
Brady Statistic AP VS Percent: 6 %
Brady Statistic AS VP Percent: 1 %
Brady Statistic AS VS Percent: 94 %
Brady Statistic RA Percent Paced: 5.8 %
Brady Statistic RV Percent Paced: 1 %
Date Time Interrogation Session: 20200908092317
Implantable Lead Implant Date: 20160415
Implantable Lead Implant Date: 20160415
Implantable Lead Location: 753859
Implantable Lead Location: 753860
Implantable Pulse Generator Implant Date: 20160415
Lead Channel Impedance Value: 440 Ohm
Lead Channel Impedance Value: 580 Ohm
Lead Channel Pacing Threshold Amplitude: 0.75 V
Lead Channel Pacing Threshold Amplitude: 0.75 V
Lead Channel Pacing Threshold Pulse Width: 0.5 ms
Lead Channel Pacing Threshold Pulse Width: 0.5 ms
Lead Channel Sensing Intrinsic Amplitude: 12 mV
Lead Channel Sensing Intrinsic Amplitude: 4.7 mV
Lead Channel Setting Pacing Amplitude: 2 V
Lead Channel Setting Pacing Amplitude: 2.5 V
Lead Channel Setting Pacing Pulse Width: 0.5 ms
Lead Channel Setting Sensing Sensitivity: 2 mV
Pulse Gen Model: 2240
Pulse Gen Serial Number: 7756161

## 2019-02-14 ENCOUNTER — Other Ambulatory Visit: Payer: Self-pay | Admitting: Family Medicine

## 2019-02-14 ENCOUNTER — Other Ambulatory Visit: Payer: Self-pay | Admitting: Internal Medicine

## 2019-02-14 ENCOUNTER — Other Ambulatory Visit: Payer: Self-pay | Admitting: "Endocrinology

## 2019-02-17 ENCOUNTER — Telehealth: Payer: Self-pay

## 2019-02-17 DIAGNOSIS — E1165 Type 2 diabetes mellitus with hyperglycemia: Secondary | ICD-10-CM

## 2019-02-17 DIAGNOSIS — E1169 Type 2 diabetes mellitus with other specified complication: Secondary | ICD-10-CM

## 2019-02-17 DIAGNOSIS — E669 Obesity, unspecified: Secondary | ICD-10-CM

## 2019-02-17 DIAGNOSIS — E890 Postprocedural hypothyroidism: Secondary | ICD-10-CM

## 2019-02-17 MED ORDER — LEVOTHYROXINE SODIUM 200 MCG PO TABS: ORAL_TABLET | ORAL | 6 refills | Status: DC

## 2019-02-17 MED ORDER — GLIPIZIDE ER 2.5 MG PO TB24: 2.5000 mg | ORAL_TABLET | Freq: Every day | ORAL | 3 refills | Status: DC

## 2019-02-17 MED ORDER — METFORMIN HCL 500 MG PO TABS: ORAL_TABLET | ORAL | 6 refills | Status: DC

## 2019-02-17 NOTE — Telephone Encounter (Signed)
LeighAnn Lovelace, CMA  

## 2019-02-17 NOTE — Telephone Encounter (Signed)
Peter Dunlap, CMA  

## 2019-02-18 ENCOUNTER — Other Ambulatory Visit (HOSPITAL_COMMUNITY)
Admission: RE | Admit: 2019-02-18 | Discharge: 2019-02-18 | Disposition: A | Discharge status: Home / Self Care | Payer: Medicare Other | Source: Ambulatory Visit | Attending: Urology | Admitting: Urology

## 2019-02-18 ENCOUNTER — Ambulatory Visit (INDEPENDENT_AMBULATORY_CARE_PROVIDER_SITE_OTHER): Payer: Medicare Other | Admitting: Urology

## 2019-02-18 DIAGNOSIS — R310 Gross hematuria: Secondary | ICD-10-CM | POA: Diagnosis not present

## 2019-02-18 DIAGNOSIS — N401 Enlarged prostate with lower urinary tract symptoms: Secondary | ICD-10-CM

## 2019-02-18 DIAGNOSIS — R351 Nocturia: Secondary | ICD-10-CM

## 2019-02-18 DIAGNOSIS — C61 Malignant neoplasm of prostate: Secondary | ICD-10-CM

## 2019-02-19 LAB — URINE CULTURE: Culture: NO GROWTH

## 2019-02-24 ENCOUNTER — Telehealth: Payer: Self-pay | Admitting: "Endocrinology

## 2019-02-24 NOTE — Telephone Encounter (Signed)
Spoke w/ patient's wife.

## 2019-02-24 NOTE — Telephone Encounter (Signed)
He can take it once a day.

## 2019-02-24 NOTE — Telephone Encounter (Signed)
Patient came in and said that the Metformin is giving him nightmares and can not sleep. He said he has decreased it to 1 tablet by mouth instead of 2. He said that he looked this up on the Internet & it is a side effect.

## 2019-02-27 ENCOUNTER — Encounter: Payer: Self-pay | Admitting: Cardiology

## 2019-02-27 NOTE — Progress Notes (Signed)
Remote pacemaker transmission.   

## 2019-02-28 ENCOUNTER — Other Ambulatory Visit: Payer: Self-pay

## 2019-02-28 MED ORDER — OXYCODONE-ACETAMINOPHEN 10-325 MG PO TABS: 1.0000 | ORAL_TABLET | Freq: Three times a day (TID) | ORAL | 0 refills | Status: DC | PRN

## 2019-02-28 NOTE — Telephone Encounter (Signed)
Requested Prescriptions   Pending Prescriptions Disp Refills  . oxyCODONE-acetaminophen (PERCOCET) 10-325 MG tablet 90 tablet 0    Sig: Take 1 tablet by mouth every 8 (eight) hours as needed for pain.    Last OV 08/02/2018  Last written 01/29/2019

## 2019-02-28 NOTE — Telephone Encounter (Signed)
Pt needs OV for meds

## 2019-03-04 ENCOUNTER — Telehealth: Payer: Self-pay | Admitting: Family Medicine

## 2019-03-04 ENCOUNTER — Ambulatory Visit (INDEPENDENT_AMBULATORY_CARE_PROVIDER_SITE_OTHER): Payer: Medicare Other

## 2019-03-04 ENCOUNTER — Other Ambulatory Visit: Payer: Self-pay

## 2019-03-04 DIAGNOSIS — Z23 Encounter for immunization: Secondary | ICD-10-CM | POA: Diagnosis not present

## 2019-03-04 MED ORDER — BLOOD GLUCOSE SYSTEM PAK KIT: PACK | 1 refills | Status: DC

## 2019-03-04 NOTE — Telephone Encounter (Signed)
Patient calling to let dr Buelah Manis know that his sugar machine is broke, is there anyway to get a replacement for this? [phone]

## 2019-03-04 NOTE — Telephone Encounter (Signed)
Prescription sent to pharmacy.

## 2019-03-04 NOTE — Progress Notes (Signed)
Patient came in today to receive his annual flu vaccine. Patient was given fluarix in the left deltoid. Patient tolerated well. VIS given.

## 2019-03-16 ENCOUNTER — Other Ambulatory Visit: Payer: Self-pay | Admitting: Family Medicine

## 2019-03-31 ENCOUNTER — Other Ambulatory Visit: Payer: Self-pay | Admitting: *Deleted

## 2019-03-31 ENCOUNTER — Other Ambulatory Visit: Payer: Self-pay | Admitting: Internal Medicine

## 2019-03-31 MED ORDER — OXYCODONE-ACETAMINOPHEN 10-325 MG PO TABS: 1.0000 | ORAL_TABLET | Freq: Three times a day (TID) | ORAL | 0 refills | Status: DC | PRN

## 2019-03-31 NOTE — Telephone Encounter (Signed)
Received call from patient.   Requested refill on Oxycodone/APAP.  Ok to refill??  Last office visit 08/02/2018.  Last refill 02/28/2019.

## 2019-04-02 DIAGNOSIS — G4733 Obstructive sleep apnea (adult) (pediatric): Secondary | ICD-10-CM | POA: Diagnosis not present

## 2019-04-16 ENCOUNTER — Other Ambulatory Visit: Payer: Self-pay

## 2019-04-16 MED ORDER — ALLOPURINOL 100 MG PO TABS: 100.0000 mg | ORAL_TABLET | Freq: Every day | ORAL | 0 refills | Status: DC

## 2019-04-29 ENCOUNTER — Other Ambulatory Visit: Payer: Self-pay | Admitting: *Deleted

## 2019-04-29 MED ORDER — OXYCODONE-ACETAMINOPHEN 10-325 MG PO TABS: 1.0000 | ORAL_TABLET | Freq: Three times a day (TID) | ORAL | 0 refills | Status: DC | PRN

## 2019-04-29 NOTE — Telephone Encounter (Signed)
Ok to refill??  Last office visit 08/02/2018.  Last refill 03/31/2019.

## 2019-04-29 NOTE — Telephone Encounter (Signed)
Pt needs OV 

## 2019-05-14 ENCOUNTER — Ambulatory Visit (INDEPENDENT_AMBULATORY_CARE_PROVIDER_SITE_OTHER): Payer: Medicare Other | Admitting: *Deleted

## 2019-05-14 DIAGNOSIS — I495 Sick sinus syndrome: Secondary | ICD-10-CM

## 2019-05-14 LAB — CUP PACEART REMOTE DEVICE CHECK
Battery Remaining Longevity: 127 mo
Battery Remaining Percentage: 95.5 %
Battery Voltage: 2.98 V
Brady Statistic AP VP Percent: 1 %
Brady Statistic AP VS Percent: 7 %
Brady Statistic AS VP Percent: 1 %
Brady Statistic AS VS Percent: 93 %
Brady Statistic RA Percent Paced: 6.7 %
Brady Statistic RV Percent Paced: 1 %
Date Time Interrogation Session: 20201209020017
Implantable Lead Implant Date: 20160415
Implantable Lead Implant Date: 20160415
Implantable Lead Location: 753859
Implantable Lead Location: 753860
Implantable Pulse Generator Implant Date: 20160415
Lead Channel Impedance Value: 430 Ohm
Lead Channel Impedance Value: 560 Ohm
Lead Channel Pacing Threshold Amplitude: 0.75 V
Lead Channel Pacing Threshold Amplitude: 0.75 V
Lead Channel Pacing Threshold Pulse Width: 0.5 ms
Lead Channel Pacing Threshold Pulse Width: 0.5 ms
Lead Channel Sensing Intrinsic Amplitude: 12 mV
Lead Channel Sensing Intrinsic Amplitude: 4.5 mV
Lead Channel Setting Pacing Amplitude: 2 V
Lead Channel Setting Pacing Amplitude: 2.5 V
Lead Channel Setting Pacing Pulse Width: 0.5 ms
Lead Channel Setting Sensing Sensitivity: 2 mV
Pulse Gen Model: 2240
Pulse Gen Serial Number: 7756161

## 2019-05-15 ENCOUNTER — Other Ambulatory Visit: Payer: Self-pay | Admitting: "Endocrinology

## 2019-05-15 ENCOUNTER — Other Ambulatory Visit: Payer: Self-pay | Admitting: *Deleted

## 2019-05-15 ENCOUNTER — Other Ambulatory Visit: Payer: Self-pay | Admitting: Internal Medicine

## 2019-05-15 DIAGNOSIS — E1169 Type 2 diabetes mellitus with other specified complication: Secondary | ICD-10-CM

## 2019-05-15 MED ORDER — DEXILANT 60 MG PO CPDR: 60.0000 mg | DELAYED_RELEASE_CAPSULE | Freq: Every day | ORAL | 5 refills | Status: DC

## 2019-05-16 ENCOUNTER — Encounter: Payer: Self-pay | Admitting: Internal Medicine

## 2019-05-16 ENCOUNTER — Other Ambulatory Visit: Payer: Medicare Other

## 2019-05-16 ENCOUNTER — Other Ambulatory Visit: Payer: Self-pay

## 2019-05-16 ENCOUNTER — Ambulatory Visit (INDEPENDENT_AMBULATORY_CARE_PROVIDER_SITE_OTHER): Payer: Medicare Other | Admitting: Internal Medicine

## 2019-05-16 VITALS — BP 100/68 | HR 68 | Ht 72.0 in | Wt 317.8 lb

## 2019-05-16 DIAGNOSIS — I480 Paroxysmal atrial fibrillation: Secondary | ICD-10-CM | POA: Diagnosis not present

## 2019-05-16 DIAGNOSIS — I495 Sick sinus syndrome: Secondary | ICD-10-CM

## 2019-05-16 DIAGNOSIS — I2510 Atherosclerotic heart disease of native coronary artery without angina pectoris: Secondary | ICD-10-CM | POA: Diagnosis not present

## 2019-05-16 DIAGNOSIS — Z0000 Encounter for general adult medical examination without abnormal findings: Secondary | ICD-10-CM

## 2019-05-16 DIAGNOSIS — I10 Essential (primary) hypertension: Secondary | ICD-10-CM | POA: Diagnosis not present

## 2019-05-16 DIAGNOSIS — Z950 Presence of cardiac pacemaker: Secondary | ICD-10-CM | POA: Diagnosis not present

## 2019-05-16 DIAGNOSIS — Z13220 Encounter for screening for lipoid disorders: Secondary | ICD-10-CM | POA: Diagnosis not present

## 2019-05-16 NOTE — Patient Instructions (Signed)
Medication Instructions:  Your physician recommends that you continue on your current medications as directed. Please refer to the Current Medication list given to you today.  Labwork: None ordered.  Testing/Procedures: None ordered.  Follow-Up: Your physician wants you to follow-up in: one year with Dr. Lovena Le.   You will receive a reminder letter in the mail two months in advance. If you don't receive a letter, please call our office to schedule the follow-up appointment.  Remote monitoring is used to monitor your Pacemaker from home. This monitoring reduces the number of office visits required to check your device to one time per year. It allows [place] to keep an eye on the functioning of your device to ensure it is working properly. You are scheduled for a device check from home on 08/13/2019. You may send your transmission at any time that day. If you have a wireless device, the transmission will be sent automatically. After your physician reviews your transmission, you will receive a postcard with your next transmission date.  Any Other Special Instructions Will Be Listed Below (If Applicable).  If you need a refill on your cardiac medications before your next appointment, please call your pharmacy.

## 2019-05-16 NOTE — Progress Notes (Signed)
HPI Mr. Brahmbhatt returns today for followup. He has sinus node dysfunction, s/p PPM insertion, PAF, atrial tachycardia, obesity, CNS bleed, s/p Watchman. He has done well in the interim. He denies chest pain or sob. No edema. No syncope. Rare palpitations. He has lost 9 lbs. Allergies  Allergen Reactions  . Trazodone And Nefazodone     Nightmares  . Lactose Intolerance (Gi) Other (See Comments)    UPSET STOMACH      Current Outpatient Medications  Medication Sig Dispense Refill  . allopurinol (ZYLOPRIM) 100 MG tablet Take 1 tablet (100 mg total) by mouth daily. 90 tablet 0  . aspirin EC 325 MG tablet Take 1 tablet (325 mg total) daily by mouth. 30 tablet 0  . Blood Glucose Monitoring Suppl (BLOOD GLUCOSE SYSTEM PAK) KIT Use as directed to monitor FSBS 1x daily. Dx: E11.9. 1 kit 1  . Cholecalciferol (VITAMIN D3) 5000 units CAPS Take 1 capsule (5,000 Units total) by mouth daily. 90 capsule 0  . colchicine 0.6 MG tablet Take 1 tablet (0.6 mg total) by mouth daily. 15 tablet 0  . dexlansoprazole (DEXILANT) 60 MG capsule Take 1 capsule (60 mg total) by mouth daily. 30 capsule 5  . diclofenac sodium (VOLTAREN) 1 % GEL Apply dime size to affected areas four times a day as needed 1 Tube 3  . furosemide (LASIX) 20 MG tablet TAKE 3 TABLETS BY MOUTH DAILY 270 tablet 3  . glipiZIDE (GLUCOTROL XL) 2.5 MG 24 hr tablet TAKE 1 TABLET(2.5 MG) BY MOUTH DAILY WITH BREAKFAST 30 tablet 3  . glipiZIDE (GLUCOTROL XL) 2.5 MG 24 hr tablet TAKE 1 TABLET(2.5 MG) BY MOUTH DAILY WITH BREAKFAST 30 tablet 3  . glipiZIDE (GLUCOTROL XL) 2.5 MG 24 hr tablet TAKE 1 TABLET(2.5 MG) BY MOUTH DAILY WITH BREAKFAST 30 tablet 3  . isosorbide mononitrate (IMDUR) 60 MG 24 hr tablet TAKE 1 TABLET BY MOUTH DAILY, PLEASE MAKE YEARLY APPT WITH DISCARD REMAINDER.Tanvir Hipple FOR DECEMBER FOR FUTURE REFILLS 90 tablet 3  . levothyroxine (SYNTHROID) 200 MCG tablet TAKE 1 TABLET(200 MCG) BY MOUTH DAILY BEFORE BREAKFAST 30 tablet 3  .  levothyroxine (SYNTHROID) 200 MCG tablet TAKE 1 TABLET(200 MCG) BY MOUTH DAILY BEFORE BREAKFAST 30 tablet 6  . Magnesium Oxide 400 (240 Mg) MG TABS TAKE 1 TABLET BY MOUTH TWICE DAILY 180 tablet 0  . metFORMIN (GLUCOPHAGE) 500 MG tablet TAKE 1 TABLET(500 MG) BY MOUTH TWICE DAILY WITH A MEAL 60 tablet 3  . metFORMIN (GLUCOPHAGE) 500 MG tablet Melchor L Morua 60 tablet 6  . nitroGLYCERIN (NITROSTAT) 0.4 MG SL tablet PLACE 1 TABLET UNDER THE TONGUE EVERY 5 MINUTES AS NEEDED FOR CHEST PAIN. CALL 911 AT THIRD DOSE IN 15 MINUTES 25 tablet 1  . ONETOUCH ULTRA test strip USE TO TEST ONCE DAILY 100 strip 3  . oxyCODONE-acetaminophen (PERCOCET) 10-325 MG tablet Take 1 tablet by mouth every 8 (eight) hours as needed for pain. 90 tablet 0  . potassium chloride SA (K-DUR) 20 MEQ tablet Take 1 tablet (20 mEq total) by mouth daily. Please make yearly appt with Dr. Lovena Le for December for future refills. 1st attempt 90 tablet 0  . RAPAFLO 8 MG CAPS capsule TAKE 1 CAPSULE(8 MG) BY MOUTH DAILY 30 capsule 2  . simvastatin (ZOCOR) 40 MG tablet TAKE 1 TABLET(40 MG) BY MOUTH DAILY 90 tablet 1  . sotalol (BETAPACE) 160 MG tablet TAKE 1 TABLET(160 MG) BY MOUTH TWICE DAILY 180 tablet 3  . traZODone (DESYREL) 50  MG tablet TAKE 1/2 TO 1 TABLET BY MOUTH AT BEDTIME AS NEEDED FOR SLEEP 30 tablet 3   No current facility-administered medications for this visit.     Past Medical History:  Diagnosis Date  . Arteriosclerotic cardiovascular disease (ASCVD) 2005   catheterization in 10/2010:50% mid LAD, diffuse distal disease, circumflex irregularities, large dominant RCA with a 50% ostial, 70% distal, 60% posterolateral and 70% PDA; normal EF  . Arthritis   . Benign prostatic hypertrophy   . Bilateral carpal tunnel syndrome 07/03/2018  . Cerebrovascular disease 2010   R. carotid endarterectomy; Duplex in 10/2010-widely patent ICAs, subtotal left vertebral-not thought to be contributing to symptoms  . Cervical spine disease     CT in 2012-advanced degeneration and spondylosis with moderate spinal stenosis at C3-C6  . Depression   . Erectile dysfunction   . Gastroesophageal reflux disease   . H/O hiatal hernia   . H/O: substance abuse ([place])    Cocaine, marijuana, alcohol.  Quit 2013.   [name] Hyperlipidemia   . Hypertension   . Non-ST elevation myocardial infarction (NSTEMI), initial episode of care [facility]) 12/02/2013   DES LAD  . Obesity   . Prostate cancer ([facility])   . Sleep apnea    CPAP  . Tachy-brady syndrome ([place])    a. s/p STJ dual chamber PPM   . Thyroid disease   . Tobacco abuse    Quit 2014  . 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   Past Surgical History:  Procedure Laterality Date  . BRAIN SURGERY  2015   hematoma evacuation  . BURR HOLE Right 04/13/2014   Procedure: Haskell Flirt;  Surgeon: Charlie Pitter, MD;  Location: [place] NEURO ORS;  Service: Neurosurgery;  Laterality: Right;  . CAROTID ENDARTERECTOMY Right Feb. 25, 2010    CEA  . CORONARY ANGIOPLASTY WITH STENT PLACEMENT  12/03/2013   LAD 90%-->0% W/ Promus Premier DES 3.0 mm x 16 mm, CFX OK, RCA 40%, EF 70-75%  . LEFT ATRIAL APPENDAGE OCCLUSION N/A 08/05/2015   Procedure: LEFT ATRIAL APPENDAGE OCCLUSION;  Surgeon: [name], MD;  Location: [place] CV LAB;  Service: Cardiovascular;  Laterality: N/A;  . LEFT HEART CATHETERIZATION WITH CORONARY ANGIOGRAM Left 12/03/2013   Procedure: LEFT HEART CATHETERIZATION WITH CORONARY ANGIOGRAM;  Surgeon: Leonie Man, MD;  Location: [facility] CATH LAB;  Service: Cardiovascular;  Laterality: Left;  . LEFT HEART CATHETERIZATION WITH CORONARY ANGIOGRAM N/A 01/26/2014   Procedure: LEFT HEART CATHETERIZATION WITH CORONARY ANGIOGRAM;  Surgeon: Jettie Booze, MD;  Location: [facility] CATH LAB;  Service: Cardiovascular;  Laterality: N/A;  . LEFT HEART CATHETERIZATION WITH CORONARY ANGIOGRAM N/A 08/03/2014   Procedure: LEFT HEART  CATHETERIZATION WITH CORONARY ANGIOGRAM;  Surgeon: Burnell Blanks, MD;  Location: [facility] CATH LAB;  Service: Cardiovascular;  Laterality: N/A;  . PERCUTANEOUS CORONARY STENT INTERVENTION (PCI-S)  12/03/2013   Procedure: PERCUTANEOUS CORONARY STENT INTERVENTION (PCI-S);  Surgeon: Leonie Man, MD;  Location: [facility] CATH LAB;  Service: Cardiovascular;;  . PERMANENT PACEMAKER INSERTION N/A 09/18/2014   Procedure: PERMANENT PACEMAKER INSERTION;  Surgeon: [name], MD;  Location: [facility] CATH LAB;  Service: Cardiovascular;  Laterality: N/A;  . RADIOFREQUENCY ABLATION  2005   for PSVT  . TEE WITHOUT CARDIOVERSION N/A 07/27/2015   Procedure: TRANSESOPHAGEAL ECHOCARDIOGRAM (TEE);  Surgeon: Lelon Perla, MD;  Location: [place];  Service: Cardiovascular;  Laterality: N/A;  .  TEE WITHOUT CARDIOVERSION N/A 09/15/2015   Procedure: TRANSESOPHAGEAL ECHOCARDIOGRAM (TEE);  Surgeon: Thayer Headings, MD;  Location: [facility] ENDOSCOPY;  Service: Cardiovascular;  Laterality: N/A;     Family History  Problem Relation Age of Onset  . Hypertension Mother        Cerebrovascular disease  . Diabetes Mother   . Coronary artery disease Father 27  . Diabetes type II Father   . Hypertension Father   . Heart attack Father   . Diabetes Brother   . Hypertension Brother   . Lung cancer Paternal Uncle   . Diabetes Sister   . Hypertension Sister   . Heart attack Sister 44  . Cancer Sister        leukemia  . Cancer Maternal Uncle        breast  . Cancer Maternal Grandmother        breast     Social History   Socioeconomic History  . Marital status: Married    Spouse name: Not on file  . Number of children: 0  . Years of education: Not on file  . Highest education level: Not on file  Occupational History  . Occupation: Retired  Tobacco Use  . Smoking status: Former Smoker    Packs/day: 1.00    Years: 40.00    Pack years: 40.00    Types: Cigarettes    Start date: 10/20/1972    Quit date: 10/10/2012     Years since quitting: 6.6  . Smokeless tobacco: Never Used  . Tobacco comment: Quit in May.   Substance and Sexual Activity  . Alcohol use: No    Alcohol/week: 0.0 standard drinks    Comment: former drinker-- sober since 2013.   . Drug use: No    Types: Cocaine    Comment: quit cocaine 10/2011  . Sexual activity: Yes    Partners: Female  Other Topics Concern  . Not on file  Social History Narrative   Lives in [place].   Social Determinants of Health   Financial Resource Strain:   . Difficulty of Paying Living Expenses: Not on file  Food Insecurity:   . Worried About Charity fundraiser in the Last Year: Not on file  . Ran Out of Food in the Last Year: Not on file  Transportation Needs:   . Lack of Transportation (Medical): Not on file  . Lack of Transportation (Non-Medical): Not on file  Physical Activity:   . Days of Exercise per Week: Not on file  . Minutes of Exercise per Session: Not on file  Stress:   . Feeling of Stress : Not on file  Social Connections:   . Frequency of Communication with Friends and Family: Not on file  . Frequency of Social Gatherings with Friends and Family: Not on file  . Attends Religious Services: Not on file  . Active Member of Clubs or Organizations: Not on file  . Attends Archivist Meetings: Not on file  . Marital Status: Not on file  Intimate Partner Violence:   . Fear of Current or Ex-Partner: Not on file  . Emotionally Abused: Not on file  . Physically Abused: Not on file  . Sexually Abused: Not on file     BP 100/68   Pulse 68   Ht 6' (1.829 m)   Wt (!) 317 lb 12.8 oz (144.2 kg)   SpO2 98%   BMI 43.10 kg/m   Physical Exam:  Well appearing NAD HEENT: Unremarkable Neck:  No JVD, no thyromegally Lymphatics:  No adenopathy Back:  No CVA tenderness Lungs:  Clear with no wheezes HEART:  Regular rate rhythm, no murmurs, no rubs, no clicks Abd:  soft, positive bowel sounds, no organomegally, no rebound, no  guarding Ext:  2 plus pulses, no edema, no cyanosis, no clubbing Skin:  No rashes no nodules Neuro:  CN II through XII intact, motor grossly intact  EKG - nsr   DEVICE  Normal device function.  See PaceArt for details.   Assess/Plan: 1. PAF - he appears to be maintaining NSR on sotalol over 99% of the time. 2. Sinus node dysfunction - his [employer] DDD PM is working normally. We will recheck in several months. 3. PPM - his [employer] PPM has about 11 years left on the battery. 4. Obesity - he has lost 9 lbs and I have asked him to lose 20 more pounds.  Mikle Bosworth.D.

## 2019-05-17 LAB — COMPREHENSIVE METABOLIC PANEL
AG Ratio: 1.1 (calc) (ref 1.0–2.5)
ALT: 20 U/L (ref 9–46)
AST: 43 U/L — ABNORMAL HIGH (ref 10–35)
Albumin: 4.1 g/dL (ref 3.6–5.1)
Alkaline phosphatase (APISO): 92 U/L (ref 35–144)
BUN/Creatinine Ratio: 9 (calc) (ref 6–22)
BUN: 11 mg/dL (ref 7–25)
CO2: 22 mmol/L (ref 20–32)
Calcium: 9.2 mg/dL (ref 8.6–10.3)
Chloride: 103 mmol/L (ref 98–110)
Creat: 1.26 mg/dL — ABNORMAL HIGH (ref 0.70–1.25)
Globulin: 3.7 g/dL (calc) (ref 1.9–3.7)
Glucose, Bld: 129 mg/dL — ABNORMAL HIGH (ref 65–99)
Potassium: 4.2 mmol/L (ref 3.5–5.3)
Sodium: 139 mmol/L (ref 135–146)
Total Bilirubin: 1.1 mg/dL (ref 0.2–1.2)
Total Protein: 7.8 g/dL (ref 6.1–8.1)

## 2019-05-17 LAB — LIPID PANEL
Cholesterol: 124 mg/dL (ref <200)
HDL: 35 mg/dL — ABNORMAL LOW (ref >=40)
LDL Cholesterol (Calc): 69 mg/dL (calc)
Non-HDL Cholesterol (Calc): 89 mg/dL (calc) (ref <130)
Total CHOL/HDL Ratio: 3.5 (calc) (ref <5.0)
Triglycerides: 122 mg/dL (ref <150)

## 2019-05-17 LAB — CBC WITH DIFFERENTIAL/PLATELET
Absolute Monocytes: 626 cells/uL (ref 200–950)
Basophils Absolute: 82 cells/uL (ref 0–200)
Basophils Relative: 1.2 %
Eosinophils Absolute: 170 cells/uL (ref 15–500)
Eosinophils Relative: 2.5 %
HCT: 46.4 % (ref 38.5–50.0)
Hemoglobin: 16.2 g/dL (ref 13.2–17.1)
Lymphs Abs: 2339 cells/uL (ref 850–3900)
MCH: 33.4 pg — ABNORMAL HIGH (ref 27.0–33.0)
MCHC: 34.9 g/dL (ref 32.0–36.0)
MCV: 95.7 fL (ref 80.0–100.0)
MPV: 12.1 fL (ref 7.5–12.5)
Monocytes Relative: 9.2 %
Neutro Abs: 3584 cells/uL (ref 1500–7800)
Neutrophils Relative %: 52.7 %
Platelets: 139 10*3/uL — ABNORMAL LOW (ref 140–400)
RBC: 4.85 10*6/uL (ref 4.20–5.80)
RDW: 13 % (ref 11.0–15.0)
Total Lymphocyte: 34.4 %
WBC: 6.8 10*3/uL (ref 3.8–10.8)

## 2019-05-17 LAB — PSA: PSA: 1.4 ng/mL (ref <=4.0)

## 2019-05-19 DIAGNOSIS — E1165 Type 2 diabetes mellitus with hyperglycemia: Secondary | ICD-10-CM | POA: Diagnosis not present

## 2019-05-19 DIAGNOSIS — E890 Postprocedural hypothyroidism: Secondary | ICD-10-CM | POA: Diagnosis not present

## 2019-05-20 LAB — COMPLETE METABOLIC PANEL WITH GFR
AG Ratio: 1.1 (calc) (ref 1.0–2.5)
ALT: 17 U/L (ref 9–46)
AST: 29 U/L (ref 10–35)
Albumin: 3.9 g/dL (ref 3.6–5.1)
Alkaline phosphatase (APISO): 82 U/L (ref 35–144)
BUN: 9 mg/dL (ref 7–25)
CO2: 26 mmol/L (ref 20–32)
Calcium: 9.3 mg/dL (ref 8.6–10.3)
Chloride: 103 mmol/L (ref 98–110)
Creat: 1.09 mg/dL (ref 0.70–1.25)
GFR, Est African American: 83 mL/min/{1.73_m2} (ref >=60)
GFR, Est Non African American: 72 mL/min/{1.73_m2} (ref >=60)
Globulin: 3.7 g/dL (calc) (ref 1.9–3.7)
Glucose, Bld: 150 mg/dL — ABNORMAL HIGH (ref 65–99)
Potassium: 4 mmol/L (ref 3.5–5.3)
Sodium: 139 mmol/L (ref 135–146)
Total Bilirubin: 0.9 mg/dL (ref 0.2–1.2)
Total Protein: 7.6 g/dL (ref 6.1–8.1)

## 2019-05-20 LAB — TSH: TSH: 4.84 mIU/L — ABNORMAL HIGH (ref 0.40–4.50)

## 2019-05-20 LAB — HEMOGLOBIN A1C
Hgb A1c MFr Bld: 7.5 % of total Hgb — ABNORMAL HIGH (ref <5.7)
Mean Plasma Glucose: 169 (calc)
eAG (mmol/L): 9.3 (calc)

## 2019-05-20 LAB — T4, FREE: Free T4: 1.4 ng/dL (ref 0.8–1.8)

## 2019-05-23 ENCOUNTER — Encounter: Payer: Medicare Other | Admitting: Family Medicine

## 2019-05-26 ENCOUNTER — Ambulatory Visit (INDEPENDENT_AMBULATORY_CARE_PROVIDER_SITE_OTHER): Payer: Medicare Other | Admitting: "Endocrinology

## 2019-05-26 ENCOUNTER — Encounter: Payer: Self-pay | Admitting: "Endocrinology

## 2019-05-26 DIAGNOSIS — I10 Essential (primary) hypertension: Secondary | ICD-10-CM

## 2019-05-26 DIAGNOSIS — E1165 Type 2 diabetes mellitus with hyperglycemia: Secondary | ICD-10-CM | POA: Insufficient documentation

## 2019-05-26 DIAGNOSIS — E890 Postprocedural hypothyroidism: Secondary | ICD-10-CM

## 2019-05-26 DIAGNOSIS — E782 Mixed hyperlipidemia: Secondary | ICD-10-CM | POA: Diagnosis not present

## 2019-05-26 NOTE — Progress Notes (Signed)
05/26/2019                                                    Endocrinology Telehealth Visit Follow up Note -During COVID -19 Pandemic  This visit type was conducted due to national recommendations for restrictions regarding the COVID-19 Pandemic  in an effort to limit this patient's exposure and mitigate transmission of the corona virus.  Due to his co-morbid illnesses, Peter Dunlap is at  moderate to high risk for complications without adequate follow up.  This format is felt to be most appropriate for him at this time.  I connected with this patient on 05/26/2019   by telephone and verified that I am speaking with the correct person using two identifiers. Peter Dunlap, [birth year]. he has verbally consented to this visit. All issues noted in this document were discussed and addressed. The format was not optimal for physical exam.    Subjective:    Patient ID: Peter Dunlap, male    DOB: 1956-03-14, PCP [name], MD   Past Medical History:  Diagnosis Date  . Arteriosclerotic cardiovascular disease (ASCVD) 2005   catheterization in 10/2010:50% mid LAD, diffuse distal disease, circumflex irregularities, large dominant RCA with a 50% ostial, 70% distal, 60% posterolateral and 70% PDA; normal EF  . Arthritis   . Benign prostatic hypertrophy   . Bilateral carpal tunnel syndrome 07/03/2018  . Cerebrovascular disease 2010   R. carotid endarterectomy; Duplex in 10/2010-widely patent ICAs, subtotal left vertebral-not thought to be contributing to symptoms  . Cervical spine disease    CT in 2012-advanced degeneration and spondylosis with moderate spinal stenosis at C3-C6  . Depression   . Erectile dysfunction   . Gastroesophageal reflux disease   . H/O hiatal hernia   . H/O: substance abuse ([place])    Cocaine, marijuana, alcohol.  Quit 2013.   [name] Hyperlipidemia   . Hypertension   . Non-ST elevation myocardial infarction (NSTEMI), initial episode of care [facility]) 12/02/2013   DES  LAD  . Obesity   . Prostate cancer ([place])   . Sleep apnea    CPAP  . Tachy-brady syndrome ([place])    a. s/p STJ dual chamber PPM   . Thyroid disease   . Tobacco abuse    Quit 2014  . 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 BRAIN SURGERY  2015   hematoma evacuation  . BURR HOLE Right 04/13/2014   Procedure: Haskell Flirt;  Surgeon: Charlie Pitter, MD;  Location: [place] NEURO ORS;  Service: Neurosurgery;  Laterality: Right;  . CAROTID ENDARTERECTOMY Right Feb. 25, 2010    CEA  . CORONARY ANGIOPLASTY WITH STENT PLACEMENT  12/03/2013   LAD 90%-->0% W/ Promus Premier DES 3.0 mm x 16 mm, CFX OK, RCA 40%, EF 70-75%  . LEFT ATRIAL APPENDAGE OCCLUSION N/A 08/05/2015   Procedure: LEFT ATRIAL APPENDAGE OCCLUSION;  Surgeon: [name], MD;  Location: [place] CV LAB;  Service: Cardiovascular;  Laterality: N/A;  . LEFT HEART CATHETERIZATION WITH CORONARY ANGIOGRAM Left 12/03/2013   Procedure: LEFT HEART CATHETERIZATION WITH CORONARY ANGIOGRAM;  Surgeon: Leonie Man, MD;  Location[facility] C[facility] LAB;  Service: Cardiovascular;  Laterality: Left;  . LEFT HEART CATHETERIZATION WITH CORONARY ANGIOGRAM N/A 01/26/2014   Procedure: LEFT  HEART CATHETERIZATION WITH CORONARY ANGIOGRAM;  Surgeon: Jettie Booze, MD;  Location: [facility] CATH LAB;  Service: Cardiovascular;  Laterality: N/A;  . LEFT HEART CATHETERIZATION WITH CORONARY ANGIOGRAM N/A 08/03/2014   Procedure: LEFT HEART CATHETERIZATION WITH CORONARY ANGIOGRAM;  Surgeon: Burnell Blanks, MD;  Location: [facility] CATH LAB;  Service: Cardiovascular;  Laterality: N/A;  . PERCUTANEOUS CORONARY STENT INTERVENTION (PCI-S)  12/03/2013   Procedure: PERCUTANEOUS CORONARY STENT INTERVENTION (PCI-S);  Surgeon: Leonie Man, MD;  Location: [facility] CATH LAB;  Service: Cardiovascular;;  . PERMANENT PACEMAKER INSERTION N/A 09/18/2014   Procedure: PERMANENT PACEMAKER INSERTION;  Surgeon: [name], MD;  Location: [facility]  CATH LAB;  Service: Cardiovascular;  Laterality: N/A;  . RADIOFREQUENCY ABLATION  2005   for PSVT  . TEE WITHOUT CARDIOVERSION N/A 07/27/2015   Procedure: TRANSESOPHAGEAL ECHOCARDIOGRAM (TEE);  Surgeon: Lelon Perla, MD;  Location: [facility] ENDOSCOPY;  Service: Cardiovascular;  Laterality: N/A;  . TEE WITHOUT CARDIOVERSION N/A 09/15/2015   Procedure: TRANSESOPHAGEAL ECHOCARDIOGRAM (TEE);  Surgeon: Thayer Headings, MD;  Location: [facility] ENDOSCOPY;  Service: Cardiovascular;  Laterality: N/A;   Social History   Socioeconomic History  . Marital status: Married    Spouse name: Not on file  . Number of children: 0  . Years of education: Not on file  . Highest education level: Not on file  Occupational History  . Occupation: Retired  Tobacco Use  . Smoking status: Former Smoker    Packs/day: 1.00    Years: 40.00    Pack years: 40.00    Types: Cigarettes    Start date: 10/20/1972    Quit date: 10/10/2012    Years since quitting: 6.6  . Smokeless tobacco: Never Used  . Tobacco comment: Quit in May.   Substance and Sexual Activity  . Alcohol use: No    Alcohol/week: 0.0 standard drinks    Comment: former drinker-- sober since 2013.   . Drug use: No    Types: Cocaine    Comment: quit cocaine 10/2011  . Sexual activity: Yes    Partners: Female  Other Topics Concern  . Not on file  Social History Narrative   Lives in [place].   Social Determinants of Health   Financial Resource Strain:   . Difficulty of Paying Living Expenses: Not on file  Food Insecurity:   . Worried About Charity fundraiser in the Last Year: Not on file  . Ran Out of Food in the Last Year: Not on file  Transportation Needs:   . Lack of Transportation (Medical): Not on file  . Lack of Transportation (Non-Medical): Not on file  Physical Activity:   . Days of Exercise per Week: Not on file  . Minutes of Exercise per Session: Not on file  Stress:   . Feeling of Stress : Not on file  Social Connections:   .  Frequency of Communication with Friends and Family: Not on file  . Frequency of Social Gatherings with Friends and Family: Not on file  . Attends Religious Services: Not on file  . Active Member of Clubs or Organizations: Not on file  . Attends Archivist Meetings: Not on file  . Marital Status: Not on file   Outpatient Encounter Medications as of 05/26/2019  Medication Sig  . allopurinol (ZYLOPRIM) 100 MG tablet Take 1 tablet (100 mg total) by mouth daily.  [name] aspirin EC 325 MG tablet Take 1 tablet (325 mg total) daily by mouth.  . Blood Glucose Monitoring Suppl (  BLOOD GLUCOSE SYSTEM PAK) KIT Use as directed to monitor FSBS 1x daily. Dx: E11.9.  [name] Cholecalciferol (VITAMIN D3) 5000 units CAPS Take 1 capsule (5,000 Units total) by mouth daily.  . colchicine 0.6 MG tablet Take 1 tablet (0.6 mg total) by mouth daily.  [name] dexlansoprazole (DEXILANT) 60 MG capsule Take 1 capsule (60 mg total) by mouth daily.  . diclofenac sodium (VOLTAREN) 1 % GEL Apply dime size to affected areas four times a day as needed  . furosemide (LASIX) 20 MG tablet TAKE 3 TABLETS BY MOUTH DAILY  . glipiZIDE (GLUCOTROL XL) 2.5 MG 24 hr tablet TAKE 1 TABLET(2.5 MG) BY MOUTH DAILY WITH BREAKFAST  . isosorbide mononitrate (IMDUR) 60 MG 24 hr tablet TAKE 1 TABLET BY MOUTH DAILY, PLEASE MAKE YEARLY APPT WITH DISCARD REMAINDER.[place] FOR DECEMBER FOR FUTURE REFILLS  . levothyroxine (SYNTHROID) 200 MCG tablet TAKE 1 TABLET(200 MCG) BY MOUTH DAILY BEFORE BREAKFAST  . Magnesium Oxide 400 (240 Mg) MG TABS TAKE 1 TABLET BY MOUTH TWICE DAILY  . metFORMIN (GLUCOPHAGE) 500 MG tablet TAKE 1 TABLET(500 MG) BY MOUTH TWICE DAILY WITH A MEAL  . nitroGLYCERIN (NITROSTAT) 0.4 MG SL tablet PLACE 1 TABLET UNDER THE TONGUE EVERY 5 MINUTES AS NEEDED FOR CHEST PAIN. CALL 911 AT THIRD DOSE IN 15 MINUTES  . ONETOUCH ULTRA test strip USE TO TEST ONCE DAILY  . oxyCODONE-acetaminophen (PERCOCET) 10-325 MG tablet Take 1 tablet by mouth every 8  (eight) hours as needed for pain.  . potassium chloride SA (K-DUR) 20 MEQ tablet Take 1 tablet (20 mEq total) by mouth daily. Please make yearly appt with Dr. Lovena Le for December for future refills. 1st attempt  . RAPAFLO 8 MG CAPS capsule TAKE 1 CAPSULE(8 MG) BY MOUTH DAILY  . simvastatin (ZOCOR) 40 MG tablet TAKE 1 TABLET(40 MG) BY MOUTH DAILY  . sotalol (BETAPACE) 160 MG tablet TAKE 1 TABLET(160 MG) BY MOUTH TWICE DAILY  . traZODone (DESYREL) 50 MG tablet TAKE 1/2 TO 1 TABLET BY MOUTH AT BEDTIME AS NEEDED FOR SLEEP  . [DISCONTINUED] glipiZIDE (GLUCOTROL XL) 2.5 MG 24 hr tablet TAKE 1 TABLET(2.5 MG) BY MOUTH DAILY WITH BREAKFAST  . [DISCONTINUED] glipiZIDE (GLUCOTROL XL) 2.5 MG 24 hr tablet TAKE 1 TABLET(2.5 MG) BY MOUTH DAILY WITH BREAKFAST  . [DISCONTINUED] levothyroxine (SYNTHROID) 200 MCG tablet TAKE 1 TABLET(200 MCG) BY MOUTH DAILY BEFORE BREAKFAST  . [DISCONTINUED] metFORMIN (GLUCOPHAGE) 500 MG tablet [place]   No facility-administered encounter medications on file as of 05/26/2019.   ALLERGIES: Allergies  Allergen Reactions  . Trazodone And Nefazodone     Nightmares  . Lactose Intolerance (Gi) Other (See Comments)    UPSET STOMACH    VACCINATION STATUS: Immunization History  Administered Date(s) Administered  . Influenza,inj,Quad PF,6+ Mos 02/17/2014, 03/09/2015, 03/10/2016, 03/27/2017, 03/20/2018, 03/04/2019  . Pneumococcal Polysaccharide-23 01/05/2014  . Tdap 12/19/2010    HPI  Peter Dunlap is a 63 year old male patient  with medical history as above.  -He is being engaged in telehealth via telephone in follow-up for the care of his type 2 diabetes, RAI induced hypothyroidism, hyperlipidemia, hypertension.  He was given RAI therapy on March 16, 2014 .  He is currently on levothyroxine 200 mcg p.o. every morning.  He reports better compliance, however he takes his thyroid hormone along with his other medications.  He has no new complaints today.  Continues to  feel better.  -He denies cold intolerance, constipation, fatigue.  He has a steady body weight since last visit.   -  He also has controlled type 2 diabetes on metformin, metformin 500 mg p.o. once a day.  He is also on glipizide 2.5 mg p.o. daily at breakfast.  His previsit labs show A1c increasing to 7.5% from 6.2%.      He still admits to dietary indiscretion.    - he denies family history of thyroid dysfunction . He denies personal history of goiter. he has hx of a-fib, and CAD which required stent placement x 2.   Review of Systems  Limited as above.  Objective:    There were no vitals taken for this visit.  Wt Readings from Last 3 Encounters:  05/16/19 (!) 317 lb 12.8 oz (144.2 kg)  11/21/18 (!) 315 lb 6.4 oz (143.1 kg)  08/22/18 (!) 320 lb (145.2 kg)    Physical Exam Constitutional:   + Obese, not in acute distress.  Normal state of mind.   Eyes:  EOMI, no exophthalmos Neck: Supple Respiratory: Adequate breathing efforts Musculoskeletal: no gross deformities, strength intact in all four extremities Skin:  no rashes, no hyperemia Neurological: no tremor with outstretched hands.   Diabetic Labs (most recent): Lab Results  Component Value Date   HGBA1C 7.5 (H) 05/19/2019   HGBA1C 6.2 (H) 11/14/2018   HGBA1C 8.8 (H) 08/15/2018     Lipid Panel ( most recent) Lipid Panel     Component Value Date/Time   CHOL 124 05/16/2019 0817   TRIG 122 05/16/2019 0817   HDL 35 (L) 05/16/2019 0817   CHOLHDL 3.5 05/16/2019 0817   VLDL 28 10/17/2016 0819   LDLCALC 69 05/16/2019 0817    Recent Results (from the past 2160 hour(s))  CUP PACEART REMOTE DEVICE CHECK     Status: None   Collection Time: 05/14/19  2:00 AM  Result Value Ref Range   Date Time Interrogation Session [id number]    Pulse Generator Manufacturer [employer]    Pulse Gen Model 2240 Assurity    Pulse Gen Serial Number [id number]    Clinic Name [facility]    Implantable Pulse Generator Type Implantable Pulse  Generator    Implantable Pulse Generator Implant Date [id number]    Implantable Lead Manufacturer [facility]    Implantable Lead Model 1688TC Tendril SDX    Implantable Lead Serial Number [id number]    Implantable Lead Implant Date [id number]    Implantable Lead Location Detail 1 UNKNOWN    Implantable Lead Location [id number]    Implantable Lead Manufacturer [facility]    Implantable Lead Model 1888TC Tendril ST Optim    Implantable Lead Serial Number [id number]    Implantable Lead Implant Date [id number]    Implantable Lead Location Detail 1 UNKNOWN    Implantable Lead Location [phone]    Lead Channel Setting Sensing Sensitivity 2.0 mV   Lead Channel Setting Sensing Adaptation Mode Fixed Pacing    Lead Channel Setting Pacing Amplitude 2.0 V   Lead Channel Setting Pacing Pulse Width 0.5 ms   Lead Channel Setting Pacing Amplitude 2.5 V   Lead Channel Status NULL    Lead Channel Impedance Value 430 ohm   Lead Channel Sensing Intrinsic Amplitude 4.5 mV   Lead Channel Pacing Threshold Amplitude 0.75 V   Lead Channel Pacing Threshold Pulse Width 0.5 ms   Lead Channel Status NULL    Lead Channel Impedance Value 560 ohm   Lead Channel Sensing Intrinsic Amplitude 12.0 mV   Lead Channel Pacing Threshold Amplitude 0.75 V   Lead Channel Pacing Threshold Pulse Width 0.5 ms  Battery Status MOS    Battery Remaining Longevity 127 mo   Battery Remaining Percentage 95.5 %   Battery Voltage 2.98 V   Brady Statistic RA Percent Paced 6.7 %   Brady Statistic RV Percent Paced 1.0 %   Brady Statistic AP VP Percent 1.0 %   Brady Statistic AS VP Percent 1.0 %   Brady Statistic AP VS Percent 7.0 %   Brady Statistic AS VS Percent 93.0 %  Comprehensive metabolic panel     Status: Abnormal   Collection Time: 05/16/19  8:17 AM  Result Value Ref Range   Glucose, Bld 129 (H) 65 - 99 mg/dL    Comment: .            Fasting reference interval . For someone without known diabetes, a glucose value >125 mg/dL indicates that they  may have diabetes and this should be confirmed with a follow-up test. .    BUN 11 7 - 25 mg/dL   Creat 1.26 (H) 0.70 - 1.25 mg/dL    Comment: For patients >70 years of age, the reference limit for Creatinine is approximately 13% higher for people identified as African-American. .    BUN/Creatinine Ratio 9 6 - 22 (calc)   Sodium 139 135 - 146 mmol/L   Potassium 4.2 3.5 - 5.3 mmol/L   Chloride 103 98 - 110 mmol/L   CO2 22 20 - 32 mmol/L   Calcium 9.2 8.6 - 10.3 mg/dL   Total Protein 7.8 6.1 - 8.1 g/dL   Albumin 4.1 3.6 - 5.1 g/dL   Globulin 3.7 1.9 - 3.7 g/dL (calc)   AG Ratio 1.1 1.0 - 2.5 (calc)   Total Bilirubin 1.1 0.2 - 1.2 mg/dL   Alkaline phosphatase (APISO) 92 35 - 144 U/L   AST 43 (H) 10 - 35 U/L   ALT 20 9 - 46 U/L  CBC with Differential/Platelet     Status: Abnormal   Collection Time: 05/16/19  8:17 AM  Result Value Ref Range   WBC 6.8 3.8 - 10.8 Thousand/uL   RBC 4.85 4.20 - 5.80 Million/uL   Hemoglobin 16.2 13.2 - 17.1 g/dL   HCT 46.4 38.5 - 50.0 %   MCV 95.7 80.0 - 100.0 fL   MCH 33.4 (H) 27.0 - 33.0 pg   MCHC 34.9 32.0 - 36.0 g/dL   RDW 13.0 11.0 - 15.0 %   Platelets 139 (L) 140 - 400 Thousand/uL   MPV 12.1 7.5 - 12.5 fL   Neutro Abs 3,584 1,500 - 7,800 cells/uL   Lymphs Abs 2,339 850 - 3,900 cells/uL   Absolute Monocytes 626 200 - 950 cells/uL   Eosinophils Absolute 170 15 - 500 cells/uL   Basophils Absolute 82 0 - 200 cells/uL   Neutrophils Relative % 52.7 %   Total Lymphocyte 34.4 %   Monocytes Relative 9.2 %   Eosinophils Relative 2.5 %   Basophils Relative 1.2 %  PSA     Status: None   Collection Time: 05/16/19  8:17 AM  Result Value Ref Range   PSA 1.4 < OR = 4.0 ng/mL    Comment: The total PSA value from this assay system is  standardized against the WHO standard. The test  result will be approximately 20% lower when compared  to the equimolar-standardized total PSA (Beckman  Coulter). Comparison of serial PSA results should be   interpreted with this fact in mind. . This test was performed using the Siemens  chemiluminescent method. Values obtained  from  different assay methods cannot be used interchangeably. PSA levels, regardless of value, should not be interpreted as absolute evidence of the presence or absence of disease.   Lipid Panel     Status: Abnormal   Collection Time: 05/16/19  8:17 AM  Result Value Ref Range   Cholesterol 124 <200 mg/dL   HDL 35 (L) > OR = 40 mg/dL   Triglycerides 122 <150 mg/dL   LDL Cholesterol (Calc) 69 mg/dL (calc)    Comment: Reference range: <100 . Desirable range <100 mg/dL for primary prevention;   <70 mg/dL for patients with CHD or diabetic patients  with > or = 2 CHD risk factors. [name] LDL-C is now calculated using the Martin-Hopkins  calculation, which is a validated novel method providing  better accuracy than the Friedewald equation in the  estimation of LDL-C.  Cresenciano Genre et al. Annamaria Helling. 1517;616(07): 2061-2068  (http://education.[facility].com/faq/FAQ164)    Total CHOL/HDL Ratio 3.5 <5.0 (calc)   Non-HDL Cholesterol (Calc) 89 <130 mg/dL (calc)    Comment: For patients with diabetes plus 1 major ASCVD risk  factor, treating to a non-HDL-C goal of <100 mg/dL  (LDL-C of <70 mg/dL) is considered a therapeutic  option.   Hemoglobin A1c     Status: Abnormal   Collection Time: 05/19/19  8:25 AM  Result Value Ref Range   Hgb A1c MFr Bld 7.5 (H) <5.7 % of total Hgb    Comment: For someone without known diabetes, a hemoglobin A1c value of 6.5% or greater indicates that they may have  diabetes and this should be confirmed with a follow-up  test. . For someone with known diabetes, a value <7% indicates  that their diabetes is well controlled and a value  greater than or equal to 7% indicates suboptimal  control. A1c targets should be individualized based on  duration of diabetes, age, comorbid conditions, and  other considerations. . Currently, no consensus  exists regarding use of hemoglobin A1c for diagnosis of diabetes for children. .    Mean Plasma Glucose 169 (calc)   eAG (mmol/L) 9.3 (calc)  COMPLETE METABOLIC PANEL WITH GFR     Status: Abnormal   Collection Time: 05/19/19  8:25 AM  Result Value Ref Range   Glucose, Bld 150 (H) 65 - 99 mg/dL    Comment: .            Fasting reference interval . For someone without known diabetes, a glucose value >125 mg/dL indicates that they may have diabetes and this should be confirmed with a follow-up test. .    BUN 9 7 - 25 mg/dL   Creat 1.09 0.70 - 1.25 mg/dL    Comment: For patients >61 years of age, the reference limit for Creatinine is approximately 13% higher for people identified as African-American. .    GFR, Est Non African American 72 > OR = 60 mL/min/1.80m   GFR, Est African American 83 > OR = 60 mL/min/1.758m  BUN/Creatinine Ratio NOT APPLICABLE 6 - 22 (calc)   Sodium 139 135 - 146 mmol/L   Potassium 4.0 3.5 - 5.3 mmol/L   Chloride 103 98 - 110 mmol/L   CO2 26 20 - 32 mmol/L   Calcium 9.3 8.6 - 10.3 mg/dL   Total Protein 7.6 6.1 - 8.1 g/dL   Albumin 3.9 3.6 - 5.1 g/dL   Globulin 3.7 1.9 - 3.7 g/dL (calc)   AG Ratio 1.1 1.0 - 2.5 (calc)   Total Bilirubin 0.9 0.2 -  1.2 mg/dL   Alkaline phosphatase (APISO) 82 35 - 144 U/L   AST 29 10 - 35 U/L   ALT 17 9 - 46 U/L  TSH     Status: Abnormal   Collection Time: 05/19/19  8:25 AM  Result Value Ref Range   TSH 4.84 (H) 0.40 - 4.50 mIU/L  T4, free     Status: None   Collection Time: 05/19/19  8:25 AM  Result Value Ref Range   Free T4 1.4 0.8 - 1.8 ng/dL     Assessment & Plan:   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  However he is on relatively large dose of levothyroxine 200 mcg p.o. daily before breakfast.  He is taking his levothyroxine along with his other medications.  He is advised to separate this hormone from other medications and  continue on the same dose  levothyroxine 200 mcg p.o. daily before breakfast.     - We discussed about the correct intake of his thyroid hormone, on empty stomach at fasting, with water, separated by at least 30 minutes from breakfast and other medications,  and separated by more than 4 hours from calcium, iron, multivitamins, acid reflux medications (PPIs). -Patient is made aware of the fact that thyroid hormone replacement is needed for life, dose to be adjusted by periodic monitoring of thyroid function tests.   2. Diabetes mellitus type 2 in obese ([place]) -His previsit labs show A1c 7.5% increasing from 6.2%.   -He he is advised to increase his Metformin to 500 mg p.o. twice daily after breakfast and after supper.  He is also advised to continue his glipizide 2.5 mg p.o. daily at breakfast.     He is advised to start monitoring blood glucose daily before breakfast.  - he  admits there is a room for improvement in his diet and drink choices. -  Suggestion is made for him to avoid simple carbohydrates  from his diet including Cakes, Sweet Desserts / Pastries, Ice Cream, Soda (diet and regular), Sweet Tea, Candies, Chips, Cookies, Sweet Pastries,  Store Bought Juices, Alcohol in Excess of  1-2 drinks a day, Artificial Sweeteners, Coffee Creamer, and "Sugar-free" Products. This will help patient to have stable blood glucose profile and potentially avoid unintended weight gain.   3. Essential hypertension -he is advised to home monitor blood pressure and report if > 140/90 on 2 separate readings.   He is advised to continue  his blood pressure medications including beta-blocker, Lasix.  4.  Hyperlipidemia: His recent lipid panel showed LDL controlled at 69.   -He is advised to continue simvastatin 40 mg p.o. nightly.   Side effects and precautions discussed with him.    - I advised patient to maintain close follow up with [name], MD for primary care needs.   - Patient Care Time  Today:  25 min, of which >50% was spent in  counseling and the rest reviewing his  current and  previous labs/studies, previous treatments, his blood glucose readings, and medications' doses and developing a plan for long-term care based on the latest recommendations for standards of care.   Peter Dunlap participated in the discussions, expressed understanding, and voiced agreement with the above plans.  All questions were answered to his satisfaction. he is encouraged to contact clinic should he have any questions or concerns prior to his return visit.   Follow up plan: Return in about 6 months (  around 11/24/2019) for Follow up with Pre-visit Labs, Bring Meter and Logs- A1c in Office.  Glade Lloyd, MD Phone: [phone]  Fax: [phone]  This note was partially dictated with voice recognition software. Similar sounding words can be transcribed inadequately or may not  be corrected upon review.  05/26/2019, 11:36 AM

## 2019-05-28 ENCOUNTER — Other Ambulatory Visit: Payer: Self-pay | Admitting: *Deleted

## 2019-05-28 MED ORDER — OXYCODONE-ACETAMINOPHEN 10-325 MG PO TABS: 1.0000 | ORAL_TABLET | Freq: Three times a day (TID) | ORAL | 0 refills | Status: DC | PRN

## 2019-05-28 NOTE — Telephone Encounter (Signed)
Ok to refill??  Last office visit 08/02/2018.  Last refill 04/29/2019.

## 2019-06-04 ENCOUNTER — Other Ambulatory Visit: Payer: Self-pay | Admitting: Internal Medicine

## 2019-06-21 ENCOUNTER — Other Ambulatory Visit: Payer: Self-pay | Admitting: Internal Medicine

## 2019-06-21 NOTE — Progress Notes (Signed)
PPM remote 

## 2019-06-23 ENCOUNTER — Other Ambulatory Visit: Payer: Self-pay | Admitting: Nurse Practitioner

## 2019-06-30 ENCOUNTER — Other Ambulatory Visit: Payer: Self-pay | Admitting: Family Medicine

## 2019-06-30 MED ORDER — OXYCODONE-ACETAMINOPHEN 10-325 MG PO TABS: 1.0000 | ORAL_TABLET | Freq: Three times a day (TID) | ORAL | 0 refills | Status: DC | PRN

## 2019-06-30 NOTE — Telephone Encounter (Signed)
Last refill:05/27/2020 Last office visit: 08/02/2018

## 2019-06-30 NOTE — Telephone Encounter (Signed)
Pt needs OV scheduled

## 2019-07-14 ENCOUNTER — Other Ambulatory Visit: Payer: Self-pay | Admitting: Family Medicine

## 2019-07-14 ENCOUNTER — Other Ambulatory Visit: Payer: Self-pay | Admitting: Internal Medicine

## 2019-07-14 DIAGNOSIS — R0600 Dyspnea, unspecified: Secondary | ICD-10-CM

## 2019-07-14 DIAGNOSIS — I209 Angina pectoris, unspecified: Secondary | ICD-10-CM

## 2019-07-14 DIAGNOSIS — R634 Abnormal weight loss: Secondary | ICD-10-CM

## 2019-07-14 DIAGNOSIS — M10071 Idiopathic gout, right ankle and foot: Secondary | ICD-10-CM

## 2019-07-15 DIAGNOSIS — G4733 Obstructive sleep apnea (adult) (pediatric): Secondary | ICD-10-CM | POA: Diagnosis not present

## 2019-07-22 DIAGNOSIS — E1136 Type 2 diabetes mellitus with diabetic cataract: Secondary | ICD-10-CM | POA: Diagnosis not present

## 2019-07-22 DIAGNOSIS — H25813 Combined forms of age-related cataract, bilateral: Secondary | ICD-10-CM | POA: Diagnosis not present

## 2019-07-30 ENCOUNTER — Other Ambulatory Visit: Payer: Self-pay | Admitting: *Deleted

## 2019-07-30 MED ORDER — OXYCODONE-ACETAMINOPHEN 10-325 MG PO TABS: 1.0000 | ORAL_TABLET | Freq: Three times a day (TID) | ORAL | 0 refills | Status: DC | PRN

## 2019-07-30 NOTE — Telephone Encounter (Signed)
Received call from patient.   Requested refill on Oxycodone/APAP.  Ok to refill??  Last office visit 08/02/2018.  Last refill 06/30/2019.

## 2019-08-03 ENCOUNTER — Ambulatory Visit: Payer: Medicare Other | Attending: Internal Medicine

## 2019-08-03 DIAGNOSIS — Z23 Encounter for immunization: Secondary | ICD-10-CM

## 2019-08-03 NOTE — Progress Notes (Signed)
   Covid-19 Vaccination Clinic  Name:  Peter Dunlap    MRN: [id number] DOB: 18-Mar-1956  08/03/2019  Mr. Lukowski was observed post Covid-19 immunization for 15 minutes without incidence. He was provided with Vaccine Information Sheet and instruction to access the V-Safe system.   Mr. Frerking was instructed to call 911 with any severe reactions post vaccine: [name] Difficulty breathing  . Swelling of your face and throat  . A fast heartbeat  . A bad rash all over your body  . Dizziness and weakness    Immunizations Administered    Name Date Dose VIS Date Route   Moderna COVID-19 Vaccine 08/03/2019  1:10 PM 0.5 mL 05/06/2019 Intramuscular   Manufacturer: Moderna   Lot: [id number]   [place]:[id number]

## 2019-08-11 ENCOUNTER — Other Ambulatory Visit: Payer: Self-pay | Admitting: *Deleted

## 2019-08-11 DIAGNOSIS — I6523 Occlusion and stenosis of bilateral carotid arteries: Secondary | ICD-10-CM

## 2019-08-12 ENCOUNTER — Other Ambulatory Visit: Payer: Self-pay

## 2019-08-12 ENCOUNTER — Ambulatory Visit (INDEPENDENT_AMBULATORY_CARE_PROVIDER_SITE_OTHER): Payer: Medicare Other | Admitting: Physician Assistant

## 2019-08-12 ENCOUNTER — Ambulatory Visit (HOSPITAL_COMMUNITY)
Admission: RE | Admit: 2019-08-12 | Discharge: 2019-08-12 | Disposition: A | Discharge status: Home / Self Care | Payer: Medicare Other | Source: Ambulatory Visit | Attending: Vascular Surgery | Admitting: Vascular Surgery

## 2019-08-12 VITALS — BP 133/70 | HR 65 | Temp 97.7°F | Resp 18 | Ht 72.0 in | Wt 319.8 lb

## 2019-08-12 DIAGNOSIS — Z9889 Other specified postprocedural states: Secondary | ICD-10-CM | POA: Diagnosis not present

## 2019-08-12 DIAGNOSIS — I6523 Occlusion and stenosis of bilateral carotid arteries: Secondary | ICD-10-CM | POA: Insufficient documentation

## 2019-08-12 NOTE — Progress Notes (Signed)
Office Note     CC:  follow up Requesting Provider:  [name], MD  HPI: Peter Dunlap is a 64 y.o. ([birth year]) male who presents for follow up for carotid disease. He is s/p right CEA by Dr. Donnetta Hutching in February of 2010. He denies any history of TIA or Stroke symptoms. He has not had any changes in vision, slurred speech, facial drooping, weakness or numbness of upper or lower extremities.  3 days ago he stepped in a hole in his yard and twisted his ankle. He has been elevating it and alternating ice and heat. He denies any popping or cracking noises. He has not noticed any swelling or bruising. He otherwise does not have any lower extremity claudication symptoms or rest pain. He has no non healing wounds. He does have hx of gout in left foot with recurrent flares but none recently   The pt on a statin for cholesterol management.  The pt on a daily aspirin.   Other AC:  None The pt BB on Furosemide for hypertension.   The pt is diabetic. Tobacco hx:  Former smoker, quit 2014  Past Medical History:  Diagnosis Date  . Arteriosclerotic cardiovascular disease (ASCVD) 2005   catheterization in 10/2010:50% mid LAD, diffuse distal disease, circumflex irregularities, large dominant RCA with a 50% ostial, 70% distal, 60% posterolateral and 70% PDA; normal EF  . Arthritis   . Benign prostatic hypertrophy   . Bilateral carpal tunnel syndrome 07/03/2018  . Cerebrovascular disease 2010   R. carotid endarterectomy; Duplex in 10/2010-widely patent ICAs, subtotal left vertebral-not thought to be contributing to symptoms  . Cervical spine disease    CT in 2012-advanced degeneration and spondylosis with moderate spinal stenosis at C3-C6  . Depression   . Erectile dysfunction   . Gastroesophageal reflux disease   . H/O hiatal hernia   . H/O: substance abuse ([place])    Cocaine, marijuana, alcohol.  Quit 2013.   [name] Hyperlipidemia   . Hypertension   . Non-ST elevation myocardial infarction  (NSTEMI), initial episode of care [facility]) 12/02/2013   DES LAD  . Obesity   . Prostate cancer ([place])   . Sleep apnea    CPAP  . Tachy-brady syndrome ([place])    a. s/p STJ dual chamber PPM   . Thyroid disease   . Tobacco abuse    Quit 2014  . 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 BRAIN SURGERY  2015   hematoma evacuation  . BURR HOLE Right 04/13/2014   Procedure: Haskell Flirt;  Surgeon: Charlie Pitter, MD;  Location: [facility];  Service: Neurosurgery;  Laterality: Right;  . CAROTID ENDARTERECTOMY Right Feb. 25, 2010    CEA  . CORONARY ANGIOPLASTY WITH STENT PLACEMENT  12/03/2013   LAD 90%-->0% W/ Promus Premier DES 3.0 mm x 16 mm, CFX OK, RCA 40%, EF 70-75%  . LEFT ATRIAL APPENDAGE OCCLUSION N/A 08/05/2015   Procedure: LEFT ATRIAL APPENDAGE OCCLUSION;  Surgeon: [name], MD;  Location: [facility];  Service: Cardiovascular;  Laterality: N/A;  . LEFT HEART CATHETERIZATION WITH CORONARY ANGIOGRAM Left 12/03/2013   Procedure: LEFT HEART CATHETERIZATION WITH CORONARY ANGIOGRAM;  Surgeon: Leonie Man, MD;  Location: [facility] CATH LAB;  Service: Cardiovascular;  Laterality: Left;  . LEFT HEART CATHETERIZATION WITH CORONARY ANGIOGRAM N/A 01/26/2014   Procedure: LEFT HEART CATHETERIZATION WITH CORONARY ANGIOGRAM;  Surgeon: Jettie Booze, MD;  Location: [facility] CATH LAB;  Service: Cardiovascular;  Laterality: N/A;  . LEFT HEART CATHETERIZATION WITH CORONARY ANGIOGRAM N/A 08/03/2014   Procedure: LEFT HEART CATHETERIZATION WITH CORONARY ANGIOGRAM;  Surgeon: Burnell Blanks, MD;  Location: [facility] CATH LAB;  Service: Cardiovascular;  Laterality: N/A;  . PERCUTANEOUS CORONARY STENT INTERVENTION (PCI-S)  12/03/2013   Procedure: PERCUTANEOUS CORONARY STENT INTERVENTION (PCI-S);  Surgeon: Leonie Man, MD;  Location: [facility] CATH LAB;  Service: Cardiovascular;;  . PERMANENT PACEMAKER INSERTION N/A 09/18/2014   Procedure: PERMANENT  PACEMAKER INSERTION;  Surgeon: [name], MD;  Location[facility] CATH LAB;  Service: Cardiovascular;  Laterality: N/A;  . RADIOFREQUENCY ABLATION  2005   for PSVT  . TEE WITHOUT CARDIOVERSION N/A 07/27/2015   Procedure: TRANSESOPHAGEAL ECHOCARDIOGRAM (TEE);  Surgeon: Lelon Perla, MD;  Location: [facility] ENDOSCOPY;  Service: Cardiovascular;  Laterality: N/A;  . TEE WITHOUT CARDIOVERSION N/A 09/15/2015   Procedure: TRANSESOPHAGEAL ECHOCARDIOGRAM (TEE);  Surgeon: Thayer Headings, MD;  Location: [facility] ENDOSCOPY;  Service: Cardiovascular;  Laterality: N/A;    Social History   Socioeconomic History  . Marital status: Married    Spouse name: Not on file  . Number of children: 0  . Years of education: Not on file  . Highest education level: Not on file  Occupational History  . Occupation: Retired  Tobacco Use  . Smoking status: Former Smoker    Packs/day: 1.00    Years: 40.00    Pack years: 40.00    Types: Cigarettes    Start date: 10/20/1972    Quit date: 10/10/2012    Years since quitting: 6.8  . Smokeless tobacco: Never Used  . Tobacco comment: Quit in May.   Substance and Sexual Activity  . Alcohol use: No    Alcohol/week: 0.0 standard drinks    Comment: former drinker-- sober since 2013.   . Drug use: No    Types: Cocaine    Comment: quit cocaine 10/2011  . Sexual activity: Yes    Partners: Female  Other Topics Concern  . Not on file  Social History Narrative   Lives in [place].   Social Determinants of Health   Financial Resource Strain:   . Difficulty of Paying Living Expenses: Not on file  Food Insecurity:   . Worried About Charity fundraiser in the Last Year: Not on file  . Ran Out of Food in the Last Year: Not on file  Transportation Needs:   . Lack of Transportation (Medical): Not on file  . Lack of Transportation (Non-Medical): Not on file  Physical Activity:   . Days of Exercise per Week: Not on file  . Minutes of Exercise per Session: Not on file  Stress:     . Feeling of Stress : Not on file  Social Connections:   . Frequency of Communication with Friends and Family: Not on file  . Frequency of Social Gatherings with Friends and Family: Not on file  . Attends Religious Services: Not on file  . Active Member of Clubs or Organizations: Not on file  . Attends Archivist Meetings: Not on file  . Marital Status: Not on file  Intimate Partner Violence:   . Fear of Current or Ex-Partner: Not on file  . Emotionally Abused: Not on file  . Physically Abused: Not on file  . Sexually Abused: Not on file    Family History  Problem Relation Age of Onset  . Hypertension Mother        Cerebrovascular  disease  . Diabetes Mother   . Coronary artery disease Father 34  . Diabetes type II Father   . Hypertension Father   . Heart attack Father   . Diabetes Brother   . Hypertension Brother   . Lung cancer Paternal Uncle   . Diabetes Sister   . Hypertension Sister   . Heart attack Sister 90  . Cancer Sister        leukemia  . Cancer Maternal Uncle        breast  . Cancer Maternal Grandmother        breast    Current Outpatient Medications  Medication Sig Dispense Refill  . allopurinol (ZYLOPRIM) 100 MG tablet TAKE 1 TABLET BY MOUTH DAILY 90 tablet 0  . aspirin EC 325 MG tablet Take 1 tablet (325 mg total) daily by mouth. 30 tablet 0  . Blood Glucose Monitoring Suppl (BLOOD GLUCOSE SYSTEM PAK) KIT Use as directed to monitor FSBS 1x daily. Dx: E11.9. 1 kit 1  . Cholecalciferol (VITAMIN D3) 5000 units CAPS Take 1 capsule (5,000 Units total) by mouth daily. 90 capsule 0  . colchicine 0.6 MG tablet Take 1 tablet (0.6 mg total) by mouth daily. 15 tablet 0  . dexlansoprazole (DEXILANT) 60 MG capsule Take 1 capsule (60 mg total) by mouth daily. 30 capsule 5  . diclofenac sodium (VOLTAREN) 1 % GEL Apply dime size to affected areas four times a day as needed 1 Tube 3  . furosemide (LASIX) 20 MG tablet TAKE 3 TABLETS BY MOUTH DAILY 270 tablet 3   . glipiZIDE (GLUCOTROL XL) 2.5 MG 24 hr tablet TAKE 1 TABLET(2.5 MG) BY MOUTH DAILY WITH BREAKFAST 30 tablet 3  . isosorbide mononitrate (IMDUR) 60 MG 24 hr tablet TAKE 1 TABLET BY MOUTH DAILY 90 tablet 3  . levothyroxine (SYNTHROID) 200 MCG tablet TAKE 1 TABLET(200 MCG) BY MOUTH DAILY BEFORE BREAKFAST 30 tablet 6  . Magnesium Oxide 400 (240 Mg) MG TABS TAKE 1 TABLET BY MOUTH TWICE DAILY 180 tablet 0  . metFORMIN (GLUCOPHAGE) 500 MG tablet TAKE 1 TABLET(500 MG) BY MOUTH TWICE DAILY WITH A MEAL 60 tablet 3  . nitroGLYCERIN (NITROSTAT) 0.4 MG SL tablet PLACE 1 TABLET UNDER THE TONGUE EVERY 5 MINUTES AS NEEDED FOR CHEST PAIN. CALL 911 AT THIRD DOSE IN 15 MINUTES 25 tablet 1  . ONETOUCH ULTRA test strip USE TO TEST ONCE DAILY 100 strip 3  . oxyCODONE-acetaminophen (PERCOCET) 10-325 MG tablet Take 1 tablet by mouth every 8 (eight) hours as needed for pain. 90 tablet 0  . potassium chloride SA (KLOR-CON) 20 MEQ tablet Take 1 tablet (20 mEq total) by mouth daily. 90 tablet 3  . RAPAFLO 8 MG CAPS capsule TAKE 1 CAPSULE(8 MG) BY MOUTH DAILY 30 capsule 2  . simvastatin (ZOCOR) 40 MG tablet TAKE 1 TABLET(40 MG) BY MOUTH DAILY 90 tablet 1  . sotalol (BETAPACE) 160 MG tablet TAKE 1 TABLET(160 MG) BY MOUTH TWICE DAILY 180 tablet 3  . traZODone (DESYREL) 50 MG tablet TAKE 1/2 TO 1 TABLET BY MOUTH AT BEDTIME AS NEEDED FOR SLEEP 30 tablet 3   No current facility-administered medications for this visit.    Allergies  Allergen Reactions  . Trazodone And Nefazodone     Nightmares  . Lactose Intolerance (Gi) Other (See Comments)    UPSET STOMACH      REVIEW OF SYSTEMS:  Review of Systems  Constitutional: Negative for chills, fever and malaise/fatigue.  HENT: Positive for congestion. Negative  for sinus pain and sore throat.        "rattling in my left ear"  Respiratory: Positive for shortness of breath (on exertion). Negative for cough and sputum production.   Cardiovascular: Positive for chest pain  (occasionally on exertion). Negative for palpitations and leg swelling.  Gastrointestinal: Negative for abdominal pain, constipation, diarrhea, nausea and vomiting.  Genitourinary: Negative for dysuria.  Musculoskeletal: Positive for joint pain.       Left ankle from recent injury  Neurological: Negative for dizziness, weakness and headaches.     PHYSICAL EXAMINATION:  Vitals:   08/12/19 1021 08/12/19 1024  BP: 121/65 133/70  Pulse: 65   Resp: 18   Temp: 97.7 F (36.5 C)   TempSrc: Temporal   SpO2: 98%   Weight: (!) 319 lb 12.8 oz (145.1 kg)   Height: 6' (1.829 m)     General:  Well appearing, not in any distress Gait: limping HENT: WNL, normocephalic Pulmonary: normal non-labored breathing , without Rales, rhonchi,  wheezing Cardiac: irregular HR, without  Murmurs without carotid bruit Abdomen: soft, NT, no masses Skin: without rashes Vascular Exam/Pulses:  Right Left  Radial 2+ (normal) 2+ (normal)  Ulnar 1+ (weak) 1+ (weak)  Femoral 2+ (normal) 2+ (normal)  Popliteal 2+ (normal) 2+ (normal)  DP 2+ (normal) 2+ (normal)  PT 2+ (normal) 2+ (normal)   Extremities: without ischemic changes, without Gangrene , without cellulitis; without open wounds; No swelling or edema. No ecchymosis. Left ankle pain on dorsi and plantar flexion. Normal motor and sensation Musculoskeletal: no muscle wasting or atrophy  Neurologic: A&O X 3;  No focal weakness or paresthesias are detected Psychiatric:  The pt has Normal affect.   Non-Invasive Vascular Imaging:   08/12/19 Bilateral Carotid duplex: Velocities in the right ICA are consistent with 1-39% stenosis. CEA patent.Velocities in the left ICA are consistent with 1-39% stenosis. Bilateral vertebral arteries with antegrade flow. Bilateral subclavian arteries with normal flow hemodynamics   ASSESSMENT/PLAN:: 64 y.o. male here for follow up of carotid stenosis. He is s/p right CEA in 2010 by Dr. Donnetta Hutching. He has not had any new symptoms  concerning for TIA or Stroke. Carotid duplex shows patent right CEA. Bilateral carotid stenosis of 1-39% which has remained stable from last ultrasound evaluation.  -has appointment with PCP on 08/25/19 which I advised him to address his left ankle if it is still hurting at that point it may need evaluated otherwise he should continue with conservative treatment - discussed what signs and symptoms should prompt the patient to seek immediate medical care - Continue Aspirin and Statin - follow up in 18 months with Carotid duplex   Karoline Caldwell, PA-C [facility] [phone]  Clinic MD:  Dr. Donnetta Hutching

## 2019-08-13 ENCOUNTER — Other Ambulatory Visit: Payer: Self-pay | Admitting: "Endocrinology

## 2019-08-13 ENCOUNTER — Other Ambulatory Visit: Payer: Self-pay | Admitting: Family Medicine

## 2019-08-13 ENCOUNTER — Ambulatory Visit (INDEPENDENT_AMBULATORY_CARE_PROVIDER_SITE_OTHER): Payer: Medicare Other | Admitting: *Deleted

## 2019-08-13 DIAGNOSIS — I495 Sick sinus syndrome: Secondary | ICD-10-CM

## 2019-08-13 DIAGNOSIS — E1169 Type 2 diabetes mellitus with other specified complication: Secondary | ICD-10-CM

## 2019-08-13 DIAGNOSIS — E890 Postprocedural hypothyroidism: Secondary | ICD-10-CM

## 2019-08-14 ENCOUNTER — Other Ambulatory Visit: Payer: Self-pay | Admitting: *Deleted

## 2019-08-14 DIAGNOSIS — I6523 Occlusion and stenosis of bilateral carotid arteries: Secondary | ICD-10-CM

## 2019-08-14 LAB — CUP PACEART REMOTE DEVICE CHECK
Battery Remaining Longevity: 128 mo
Battery Remaining Percentage: 95.5 %
Battery Voltage: 2.98 V
Brady Statistic AP VP Percent: 1 %
Brady Statistic AP VS Percent: 3.1 %
Brady Statistic AS VP Percent: 1 %
Brady Statistic AS VS Percent: 97 %
Brady Statistic RA Percent Paced: 2.9 %
Brady Statistic RV Percent Paced: 1 %
Date Time Interrogation Session: 20210310020013
Implantable Lead Implant Date: 20160415
Implantable Lead Implant Date: 20160415
Implantable Lead Location: 753859
Implantable Lead Location: 753860
Implantable Pulse Generator Implant Date: 20160415
Lead Channel Impedance Value: 410 Ohm
Lead Channel Impedance Value: 560 Ohm
Lead Channel Pacing Threshold Amplitude: 0.5 V
Lead Channel Pacing Threshold Amplitude: 0.5 V
Lead Channel Pacing Threshold Pulse Width: 0.5 ms
Lead Channel Pacing Threshold Pulse Width: 0.5 ms
Lead Channel Sensing Intrinsic Amplitude: 12 mV
Lead Channel Sensing Intrinsic Amplitude: 3.9 mV
Lead Channel Setting Pacing Amplitude: 2 V
Lead Channel Setting Pacing Amplitude: 2.5 V
Lead Channel Setting Pacing Pulse Width: 0.5 ms
Lead Channel Setting Sensing Sensitivity: 2 mV
Pulse Gen Model: 2240
Pulse Gen Serial Number: 7756161

## 2019-08-14 NOTE — Progress Notes (Signed)
PPM Remote  

## 2019-08-25 ENCOUNTER — Ambulatory Visit (INDEPENDENT_AMBULATORY_CARE_PROVIDER_SITE_OTHER): Payer: Medicare Other | Admitting: Family Medicine

## 2019-08-25 ENCOUNTER — Other Ambulatory Visit: Payer: Self-pay

## 2019-08-25 ENCOUNTER — Telehealth: Payer: Self-pay | Admitting: *Deleted

## 2019-08-25 ENCOUNTER — Encounter: Payer: Self-pay | Admitting: Family Medicine

## 2019-08-25 VITALS — BP 138/64 | HR 64 | Temp 98.5°F | Resp 16 | Ht 72.0 in | Wt 320.0 lb

## 2019-08-25 DIAGNOSIS — C61 Malignant neoplasm of prostate: Secondary | ICD-10-CM

## 2019-08-25 DIAGNOSIS — I5032 Chronic diastolic (congestive) heart failure: Secondary | ICD-10-CM | POA: Diagnosis not present

## 2019-08-25 DIAGNOSIS — I2510 Atherosclerotic heart disease of native coronary artery without angina pectoris: Secondary | ICD-10-CM | POA: Diagnosis not present

## 2019-08-25 DIAGNOSIS — I10 Essential (primary) hypertension: Secondary | ICD-10-CM | POA: Diagnosis not present

## 2019-08-25 DIAGNOSIS — E559 Vitamin D deficiency, unspecified: Secondary | ICD-10-CM | POA: Diagnosis not present

## 2019-08-25 DIAGNOSIS — E1149 Type 2 diabetes mellitus with other diabetic neurological complication: Secondary | ICD-10-CM

## 2019-08-25 DIAGNOSIS — M109 Gout, unspecified: Secondary | ICD-10-CM

## 2019-08-25 DIAGNOSIS — Z0001 Encounter for general adult medical examination with abnormal findings: Secondary | ICD-10-CM | POA: Diagnosis not present

## 2019-08-25 DIAGNOSIS — G894 Chronic pain syndrome: Secondary | ICD-10-CM

## 2019-08-25 DIAGNOSIS — G609 Hereditary and idiopathic neuropathy, unspecified: Secondary | ICD-10-CM

## 2019-08-25 DIAGNOSIS — Z0000 Encounter for general adult medical examination without abnormal findings: Secondary | ICD-10-CM

## 2019-08-25 DIAGNOSIS — E669 Obesity, unspecified: Secondary | ICD-10-CM

## 2019-08-25 DIAGNOSIS — E1169 Type 2 diabetes mellitus with other specified complication: Secondary | ICD-10-CM

## 2019-08-25 MED ORDER — SHINGRIX 50 MCG/0.5ML IM SUSR: 0.5000 mL | Freq: Once | INTRAMUSCULAR | 1 refills | Status: AC

## 2019-08-25 MED ORDER — ALLOPURINOL 100 MG PO TABS: 100.0000 mg | ORAL_TABLET | Freq: Every day | ORAL | 0 refills | Status: DC

## 2019-08-25 MED ORDER — OXYCODONE-ACETAMINOPHEN 10-325 MG PO TABS: 1.0000 | ORAL_TABLET | Freq: Three times a day (TID) | ORAL | 0 refills | Status: DC | PRN

## 2019-08-25 MED ORDER — LORATADINE 10 MG PO TABS: 10.0000 mg | ORAL_TABLET | Freq: Every day | ORAL | 2 refills | Status: DC | PRN

## 2019-08-25 NOTE — Progress Notes (Signed)
Subjective:   Patient presents for Medicare Annual/Subsequent preventive examination.    Pt here for wellness exam and to f/u chronic medical problems   Left ear discomfort for a few months, used some OTC ear drops that didn't help. He tried Mucinex that didn't help   has sinus pressure and drainage. Coughs up occ phlegm no SOB  CAD- no chest pain , checks BP at home typically 120's / 70's   He has not been as active as gained  Weight  he is still on lasix with potasium , sotalol , simvastatin, Imdur  PAF/Tachy brady syndrome followed by Dr. Lovena Le had check in December no changes   DM type 2-A1C was  7.5% in December, when he does check his CBG 180-200's, he had one episode up to 400, he cut out soda  at visit in December metformin increased to 500mg  twice a day, glipizidde 2.5mg  once a day    Hypothyroidism- taking synthroid 238mcg per endocrinology   Left ankle pain, pain will come out of no where, no specific injury, will just get pain out of no where, not sure if it was gout   He is taking allopurinol   GERD- he is taking Dexilant   BPH/ Prostate cancer followed by urology no current symptoms   Request refill on pain medication   Review Past Medical/Family/Social: per EMR    Risk Factors  Current exercise habits: None  Dietary issues discussed: Yes  Cardiac risk factors: Obesity (BMI >= 30 kg/m2). CAD/ A FIB/dm   Depression Screen  (Note: if answer to either of the following is "Yes", a more complete depression screening is indicated)  Over the past two weeks, have you felt down, depressed or hopeless? No Over the past two weeks, have you felt little interest or pleasure in doing things? No Have you lost interest or pleasure in daily life? No Do you often feel hopeless? No Do you cry easily over simple problems? No   Activities of Daily Living  In your present state of health, do you have any difficulty performing the following activities?:  Driving? No  Managing  money? No  Feeding yourself? No  Getting from bed to chair? No  Climbing a flight of stairs? Yes   Preparing food and eating?: No  Bathing or showering? No  Getting dressed: No  Getting to the toilet? No  Using the toilet:No  Moving around from place to place: Yes  In the past year have you fallen or had a near fall?:No  Are you sexually active? No  Do you have more than one partner? No   Hearing Difficulties: No  Do you often ask people to speak up or repeat themselves? No  Do you experience ringing or noises in your ears? No Do you have difficulty understanding soft or whispered voices? No  Do you feel that you have a problem with memory? No Do you often misplace items? No  Do you feel safe at home? Yes  Cognitive Testing  Alert? Yes Normal Appearance?Yes  Oriented to person? Yes Place? Yes  Time? Yes  Recall of three objects? Yes  Can perform simple calculations? Yes  Displays appropriate judgment?Yes  Can read the correct time from a watch face?Yes   List the Names of Other Physician/Practitioners you currently use:  Cardiology, endocrinology, Opthalmolgy Orthopedics  Urology   Screening Tests / Date Colonoscopy     UTD 2015  Zostavax  Shingrix  Pneumonia- UTD  Influenza Vaccine  utd  Tetanus/tdap utd  PSA per urology  covid 19 VACCINE #1   ROS:  GEN- denies fatigue, fever, weight loss,weakness, recent illness HEENT- denies eye drainage, change in vision, nasal discharge, CVS- denies chest pain, palpitations RESP- denies SOB, cough, wheeze ABD- denies N/V, change in stools, abd pain GU- denies dysuria, hematuria, dribbling, incontinence MSK- + joint pain, muscle aches, injury Neuro- denies headache, dizziness, syncope, seizure activity  Physical: Vitals reviewed  GEN- NAD, alert and oriented x3 HEENT- PERRL, EOMI, non injected sclera, pink conjunctiva, MMM, oropharynx clear, TM clear no effusion, canals clear, +enlarted turbinates, clear  rhinorrhea, no sinus tenderness  Neck- Supple, no thryomegaly CVS- RRR, no murmur RESP-CTAB ABD-NABS,soft,NT,ND EXT- trace ankle  edema  MSK- good ROM bilat ankles, no erythema, neg squeeze test left foot, ligaments in tact ankle Pulses- Radial, DP- 2+   Assessment:    Annual wellness medicare exam   Plan:    During the course of the visit the patient was educated and counseled about appropriate screening and preventive services including:   Wellness completed.  He will complete his Covid vaccination then he can wait a few weeks before getting his shingles vaccine.  FALL/DEPRESSION/CAGE screen negative  Chronic pain syndrome- avoid NSAIDS continue percocet  CAD/AFIB/ per cardiology,bp controlled on statin drug   DM- recheck A1C recent change in MTF, fasting CBG still elevated, discussed dietary changes, he is eating a lot of fried foods, carbs He has however cut out soda recently   Class 3 obesity- discussed walking down his street once a day approx 1/2 mile for exercise  Left ankle pain- no sign of injury, probable gout, check uric acid, use topical nsaid, ICE as needed   GERD- continue PPI benefits from this  Prostate cancer on flomax, monitored by urology   Seasonal allergies- claritin sent   Pt has advanced directive paperwork at home, will review with is wife          Diet review for nutrition referral? Yes ____ Not Indicated __x__  Patient Instructions (the written plan) was given to the patient.  Medicare Attestation  I have personally reviewed:  The patient's medical and social history  Their use of alcohol, tobacco or illicit drugs  Their current medications and supplements  The patient's functional ability including ADLs,fall risks, home safety risks, cognitive, and hearing and visual impairment  Diet and physical activities  Evidence for depression or mood disorders  The patient's weight, height, BMI, and visual acuity have been recorded in the  chart. I have made referrals, counseling, and provided education to the patient based on review of the above and I have provided the patient with a written personalized care plan for preventive services.

## 2019-08-25 NOTE — Telephone Encounter (Signed)
-----   Message from [name], MD sent at 08/25/2019  9:18 AM EDT ----- Regarding: sEND [facility] to pharmacy

## 2019-08-25 NOTE — Patient Instructions (Addendum)
F/U 6 months  We will call with lab results  Use topical anti-inflammatory such as icy ht/ aspercreme For allergies claritin once a day  Shingles vaccine after COVID vaccine, wait 2 weeks

## 2019-08-26 LAB — CBC WITH DIFFERENTIAL/PLATELET
Absolute Monocytes: 666 cells/uL (ref 200–950)
Basophils Absolute: 78 cells/uL (ref 0–200)
Basophils Relative: 1.3 %
Eosinophils Absolute: 162 cells/uL (ref 15–500)
Eosinophils Relative: 2.7 %
HCT: 43.3 % (ref 38.5–50.0)
Hemoglobin: 15 g/dL (ref 13.2–17.1)
Lymphs Abs: 1704 cells/uL (ref 850–3900)
MCH: 33.6 pg — ABNORMAL HIGH (ref 27.0–33.0)
MCHC: 34.6 g/dL (ref 32.0–36.0)
MCV: 96.9 fL (ref 80.0–100.0)
MPV: 11.7 fL (ref 7.5–12.5)
Monocytes Relative: 11.1 %
Neutro Abs: 3390 cells/uL (ref 1500–7800)
Neutrophils Relative %: 56.5 %
Platelets: 129 10*3/uL — ABNORMAL LOW (ref 140–400)
RBC: 4.47 10*6/uL (ref 4.20–5.80)
RDW: 12.8 % (ref 11.0–15.0)
Total Lymphocyte: 28.4 %
WBC: 6 10*3/uL (ref 3.8–10.8)

## 2019-08-26 LAB — BASIC METABOLIC PANEL
BUN: 10 mg/dL (ref 7–25)
CO2: 27 mmol/L (ref 20–32)
Calcium: 9.3 mg/dL (ref 8.6–10.3)
Chloride: 104 mmol/L (ref 98–110)
Creat: 1.1 mg/dL (ref 0.70–1.25)
Glucose, Bld: 135 mg/dL — ABNORMAL HIGH (ref 65–99)
Potassium: 4.2 mmol/L (ref 3.5–5.3)
Sodium: 140 mmol/L (ref 135–146)

## 2019-08-26 LAB — HEMOGLOBIN A1C
Hgb A1c MFr Bld: 6.8 % of total Hgb — ABNORMAL HIGH (ref <5.7)
Mean Plasma Glucose: 148 (calc)
eAG (mmol/L): 8.2 (calc)

## 2019-08-26 LAB — VITAMIN D 25 HYDROXY (VIT D DEFICIENCY, FRACTURES): Vit D, 25-Hydroxy: 21 ng/mL — ABNORMAL LOW (ref 30–100)

## 2019-08-26 LAB — URIC ACID: Uric Acid, Serum: 6.7 mg/dL (ref 4.0–8.0)

## 2019-08-27 ENCOUNTER — Other Ambulatory Visit: Payer: Self-pay | Admitting: *Deleted

## 2019-08-27 MED ORDER — VITAMIN D (ERGOCALCIFEROL) 1.25 MG (50000 UNIT) PO CAPS: 50000.0000 [IU] | ORAL_CAPSULE | ORAL | 1 refills | Status: DC

## 2019-09-03 ENCOUNTER — Other Ambulatory Visit: Payer: Self-pay | Admitting: Internal Medicine

## 2019-09-06 ENCOUNTER — Ambulatory Visit: Payer: Medicare Other | Attending: Internal Medicine

## 2019-09-06 DIAGNOSIS — Z23 Encounter for immunization: Secondary | ICD-10-CM

## 2019-09-06 NOTE — Progress Notes (Signed)
   Covid-19 Vaccination Clinic  Name:  Peter Dunlap    MRN: [id number] DOB: [birth year]  09/06/2019  Peter Dunlap was observed post Covid-19 immunization for 15 minutes without incident. He was provided with Vaccine Information Sheet and instruction to access the V-Safe system.   Peter Dunlap was instructed to call 911 with any severe reactions post vaccine: [name] Difficulty breathing  . Swelling of face and throat  . A fast heartbeat  . A bad rash all over body  . Dizziness and weakness   Immunizations Administered    Name Date Dose VIS Date Route   Moderna COVID-19 Vaccine 09/06/2019 12:31 PM 0.5 mL 05/06/2019 Intramuscular   Manufacturer: Moderna   Lot: [id number]   [id number]

## 2019-09-12 ENCOUNTER — Other Ambulatory Visit: Payer: Self-pay | Admitting: "Endocrinology

## 2019-09-16 ENCOUNTER — Ambulatory Visit: Payer: Medicare Other | Admitting: Urology

## 2019-09-23 ENCOUNTER — Ambulatory Visit: Payer: Medicare Other | Admitting: Urology

## 2019-09-25 ENCOUNTER — Other Ambulatory Visit: Payer: Self-pay | Admitting: *Deleted

## 2019-09-25 NOTE — Telephone Encounter (Signed)
Received call from patient.   Requested refill on Oxycodone/APAP.   Ok to refill??  Last office visit/ refill 08/25/2019.

## 2019-09-26 MED ORDER — OXYCODONE-ACETAMINOPHEN 10-325 MG PO TABS: 1.0000 | ORAL_TABLET | Freq: Three times a day (TID) | ORAL | 0 refills | Status: DC | PRN

## 2019-10-01 ENCOUNTER — Other Ambulatory Visit: Payer: Self-pay

## 2019-10-01 DIAGNOSIS — E1169 Type 2 diabetes mellitus with other specified complication: Secondary | ICD-10-CM

## 2019-10-01 DIAGNOSIS — E890 Postprocedural hypothyroidism: Secondary | ICD-10-CM

## 2019-10-01 MED ORDER — GLIPIZIDE ER 2.5 MG PO TB24: ORAL_TABLET | ORAL | 0 refills | Status: DC

## 2019-10-01 MED ORDER — LEVOTHYROXINE SODIUM 200 MCG PO TABS: 200.0000 ug | ORAL_TABLET | Freq: Every day | ORAL | 0 refills | Status: DC

## 2019-10-13 DIAGNOSIS — G4733 Obstructive sleep apnea (adult) (pediatric): Secondary | ICD-10-CM | POA: Diagnosis not present

## 2019-10-27 ENCOUNTER — Other Ambulatory Visit: Payer: Self-pay | Admitting: *Deleted

## 2019-10-27 MED ORDER — OXYCODONE-ACETAMINOPHEN 10-325 MG PO TABS: 1.0000 | ORAL_TABLET | Freq: Three times a day (TID) | ORAL | 0 refills | Status: DC | PRN

## 2019-10-27 NOTE — Telephone Encounter (Signed)
Received call from patient.   Requested refill on Oxycodone/APAP.  Ok to refill??  Last office visit 08/25/2019.  Last refill 09/26/2019.

## 2019-11-04 ENCOUNTER — Other Ambulatory Visit: Payer: Self-pay

## 2019-11-04 DIAGNOSIS — C61 Malignant neoplasm of prostate: Secondary | ICD-10-CM

## 2019-11-07 ENCOUNTER — Telehealth: Payer: Self-pay | Admitting: Urology

## 2019-11-07 ENCOUNTER — Other Ambulatory Visit: Payer: Self-pay

## 2019-11-07 DIAGNOSIS — C61 Malignant neoplasm of prostate: Secondary | ICD-10-CM

## 2019-11-07 MED ORDER — TAMSULOSIN HCL 0.4 MG PO CAPS: 0.4000 mg | ORAL_CAPSULE | Freq: Every day | ORAL | 3 refills | Status: DC

## 2019-11-07 NOTE — Telephone Encounter (Signed)
Pt wife called and stated the pharmacy has not received any refill information for tamsolusin. Would like for a nurse to call her back.

## 2019-11-07 NOTE — Telephone Encounter (Signed)
Refill submitted for flomax

## 2019-11-10 ENCOUNTER — Other Ambulatory Visit: Payer: Self-pay | Admitting: Urology

## 2019-11-11 ENCOUNTER — Other Ambulatory Visit: Payer: Self-pay | Admitting: "Endocrinology

## 2019-11-11 ENCOUNTER — Other Ambulatory Visit: Payer: Self-pay | Admitting: Family Medicine

## 2019-11-11 ENCOUNTER — Ambulatory Visit: Payer: Medicare Other | Admitting: Urology

## 2019-11-11 DIAGNOSIS — E890 Postprocedural hypothyroidism: Secondary | ICD-10-CM

## 2019-11-11 DIAGNOSIS — E1169 Type 2 diabetes mellitus with other specified complication: Secondary | ICD-10-CM

## 2019-11-11 LAB — PSA: PSA: 1.4 ng/mL (ref <=4.0)

## 2019-11-12 ENCOUNTER — Ambulatory Visit (INDEPENDENT_AMBULATORY_CARE_PROVIDER_SITE_OTHER): Payer: Medicare Other | Admitting: *Deleted

## 2019-11-12 DIAGNOSIS — I5032 Chronic diastolic (congestive) heart failure: Secondary | ICD-10-CM

## 2019-11-12 DIAGNOSIS — I495 Sick sinus syndrome: Secondary | ICD-10-CM

## 2019-11-12 LAB — CUP PACEART REMOTE DEVICE CHECK
Battery Remaining Longevity: 128 mo
Battery Remaining Percentage: 95.5 %
Battery Voltage: 2.98 V
Brady Statistic AP VP Percent: 1 %
Brady Statistic AP VS Percent: 3 %
Brady Statistic AS VP Percent: 1 %
Brady Statistic AS VS Percent: 97 %
Brady Statistic RA Percent Paced: 2.8 %
Brady Statistic RV Percent Paced: 1 %
Date Time Interrogation Session: 20210609033646
Implantable Lead Implant Date: 20160415
Implantable Lead Implant Date: 20160415
Implantable Lead Location: 753859
Implantable Lead Location: 753860
Implantable Pulse Generator Implant Date: 20160415
Lead Channel Impedance Value: 440 Ohm
Lead Channel Impedance Value: 560 Ohm
Lead Channel Pacing Threshold Amplitude: 0.5 V
Lead Channel Pacing Threshold Amplitude: 0.5 V
Lead Channel Pacing Threshold Pulse Width: 0.5 ms
Lead Channel Pacing Threshold Pulse Width: 0.5 ms
Lead Channel Sensing Intrinsic Amplitude: 12 mV
Lead Channel Sensing Intrinsic Amplitude: 4.9 mV
Lead Channel Setting Pacing Amplitude: 2 V
Lead Channel Setting Pacing Amplitude: 2.5 V
Lead Channel Setting Pacing Pulse Width: 0.5 ms
Lead Channel Setting Sensing Sensitivity: 2 mV
Pulse Gen Model: 2240
Pulse Gen Serial Number: 7756161

## 2019-11-13 NOTE — Progress Notes (Signed)
Remote pacemaker transmission.   

## 2019-11-14 DIAGNOSIS — G4733 Obstructive sleep apnea (adult) (pediatric): Secondary | ICD-10-CM | POA: Diagnosis not present

## 2019-11-18 ENCOUNTER — Other Ambulatory Visit: Payer: Self-pay

## 2019-11-18 ENCOUNTER — Ambulatory Visit (INDEPENDENT_AMBULATORY_CARE_PROVIDER_SITE_OTHER): Payer: Medicare Other | Admitting: Urology

## 2019-11-18 ENCOUNTER — Encounter: Payer: Self-pay | Admitting: Urology

## 2019-11-18 VITALS — BP 136/85 | HR 73 | Temp 97.3°F | Ht 72.0 in | Wt 315.0 lb

## 2019-11-18 DIAGNOSIS — C61 Malignant neoplasm of prostate: Secondary | ICD-10-CM

## 2019-11-18 NOTE — Progress Notes (Signed)
Urological Symptom Review  Patient is experiencing the following symptoms: Getting up at night Trouble starting stream Bleeding from penis    Review of Systems  Gastrointestinal (upper)  : Negative for upper GI symptoms  Gastrointestinal (lower) : Negative for lower GI symptoms  Constitutional : Negative for symptoms  Skin: Negative for skin symptoms  Eyes: Negative for eye symptoms  Ear/Nose/Throat : Negative for Ear/Nose/Throat symptoms  Hematologic/Lymphatic: Negative for Hematologic/Lymphatic symptoms  Cardiovascular : Negative for cardiovascular symptoms  Respiratory : Negative for respiratory symptoms  Endocrine: Negative for endocrine symptoms  Musculoskeletal: Back pain Joint pain  Neurological: Negative for neurological symptoms  Psychologic: Negative for psychiatric symptoms  The patient did not stay for appointment

## 2019-11-18 NOTE — Progress Notes (Signed)
H&P  Chief Complaint: Prostate Cancer  History of Present Illness:   6.15.2021: Most recent PSA was 1.4 on 6.7.2021.   (below copied from [place] records):  Hematuria:  Peter Dunlap is a 64 year-old male established patient who is here for blood in the urine.  He first noticed the symptoms approximately 10/27/2016.   Prior to his 5.29.2018 appointment he began having dysuria and gross hematuria, initially in his stream. It was felt that he had a urinary tract infection. Urine culture was negative, but he did clear with an antibiotic.   9.15.2020: He has only had one instance of hematuria since last visit, which occurred last month. He states that he had a single clot per his urine that had a little bit of blood afterwards,   Prostate cancer:   His prostate cancer was diagnosed 01/18/2016. His PSA at his time of diagnosis was 9.09. His most recent PSA is 0.8.   He has undergone External Beam Radiation Therapy for treatment.   He underwent TRUS/Bx on 8.15.2017. At that time, PSA was 9.09. Prostatic volume was 25 cc. 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   9.15.2020: PSA 0.8.   BPH:  Patient is currently treated with Rapaflo for his symptoms.   4.16.2019: He still has moderate voiding symptomatology despite being on Rapaflo. No gross hematuria or dysuria, however.  IPSS 25  Rapaflo increased to BID.   10.22.2019: Current IPSS 27 QOl score 2--only on 1 Rapaflo a day   1.27.2020: The patient is still having significant lower urinary tract symptoms. Rapaflo increased to Q 12 hrs   5.12.2020: IPSS is 21. Quality of life score 2. He is on generic Rapaflo twice a day.   9.15.2020: He reports that his only major urinary issue is that his stream is somewhat slow to start. He also notes that  he has occasional dysuria but this is not too bothersome for him. He continues on tamsulosin 2x a day. He has variable nocturia depending on his evening fluid intake.   Past Medical History:  Diagnosis Date  . Arteriosclerotic cardiovascular disease (ASCVD) 2005   catheterization in 10/2010:50% mid LAD, diffuse distal disease, circumflex irregularities, large dominant RCA with a 50% ostial, 70% distal, 60% posterolateral and 70% PDA; normal EF  . Arthritis   . Benign prostatic hypertrophy   . Bilateral carpal tunnel syndrome 07/03/2018  . Cerebrovascular disease 2010   R. carotid endarterectomy; Duplex in 10/2010-widely patent ICAs, subtotal left vertebral-not thought to be contributing to symptoms  . Cervical spine disease    CT in 2012-advanced degeneration and spondylosis with moderate spinal stenosis at C3-C6  . Depression   . Erectile dysfunction   . Gastroesophageal reflux disease   . H/O hiatal hernia   . H/O: substance abuse ([place])    Cocaine, marijuana, alcohol.  Quit 2013.   [name] Hyperlipidemia   . Hypertension   . Non-ST elevation myocardial infarction (NSTEMI), initial episode of care [facility]) 12/02/2013   DES LAD  . Obesity   . Prostate cancer ([place])   . Sleep apnea    CPAP  . Tachy-brady syndrome ([place])    a. s/p STJ dual chamber PPM   . Thyroid disease   . Tobacco abuse    Quit 2014  . 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 BRAIN SURGERY  2015   hematoma evacuation  . BURR HOLE Right 04/13/2014   Procedure: Haskell Flirt;  Surgeon: Charlie Pitter, MD;  Location: [place] [facility];  Service: Neurosurgery;  Laterality: Right;  . CAROTID ENDARTERECTOMY Right Feb. 25, 2010    CEA  . CORONARY ANGIOPLASTY WITH STENT PLACEMENT  12/03/2013   LAD 90%-->0% W/ Promus Premier DES 3.0 mm x 16 mm, CFX OK, RCA 40%, EF 70-75%  . LEFT ATRIAL APPENDAGE OCCLUSION N/A 08/05/2015   Procedure: LEFT ATRIAL APPENDAGE OCCLUSION;  Surgeon:  [name], MD;  Location: [place] CV LAB;  Service: Cardiovascular;  Laterality: N/A;  . LEFT HEART CATHETERIZATION WITH CORONARY ANGIOGRAM Left 12/03/2013   Procedure: LEFT HEART CATHETERIZATION WITH CORONARY ANGIOGRAM;  Surgeon: Leonie Man, MD;  Location: [facility] CATH LAB;  Service: Cardiovascular;  Laterality: Left;  . LEFT HEART CATHETERIZATION WITH CORONARY ANGIOGRAM N/A 01/26/2014   Procedure: LEFT HEART CATHETERIZATION WITH CORONARY ANGIOGRAM;  Surgeon: Jettie Booze, MD;  Location: [facility] CATH LAB;  Service: Cardiovascular;  Laterality: N/A;  . LEFT HEART CATHETERIZATION WITH CORONARY ANGIOGRAM N/A 08/03/2014   Procedure: LEFT HEART CATHETERIZATION WITH CORONARY ANGIOGRAM;  Surgeon: Burnell Blanks, MD;  Location: [facility] CATH LAB;  Service: Cardiovascular;  Laterality: N/A;  . PERCUTANEOUS CORONARY STENT INTERVENTION (PCI-S)  12/03/2013   Procedure: PERCUTANEOUS CORONARY STENT INTERVENTION (PCI-S);  Surgeon: Leonie Man, MD;  Location: [facility] CATH LAB;  Service: Cardiovascular;;  . PERMANENT PACEMAKER INSERTION N/A 09/18/2014   Procedure: PERMANENT PACEMAKER INSERTION;  Surgeon: [name], MD;  Location: [facility] CATH LAB;  Service: Cardiovascular;  Laterality: N/A;  . RADIOFREQUENCY ABLATION  2005   for PSVT  . TEE WITHOUT CARDIOVERSION N/A 07/27/2015   Procedure: TRANSESOPHAGEAL ECHOCARDIOGRAM (TEE);  Surgeon: Lelon Perla, MD;  Location: [facility] ENDOSCOPY;  Service: Cardiovascular;  Laterality: N/A;  . TEE WITHOUT CARDIOVERSION N/A 09/15/2015   Procedure: TRANSESOPHAGEAL ECHOCARDIOGRAM (TEE);  Surgeon: Thayer Headings, MD;  Location: [place];  Service: Cardiovascular;  Laterality: N/A;    Home Medications:  Allergies as of 11/18/2019      Reactions   Trazodone And Nefazodone    Nightmares   Lactose Intolerance (gi) Other (See Comments)   UPSET STOMACH      Medication List       Accurate as of November 18, 2019  1:35 PM. If you have any questions, ask your nurse or doctor.          allopurinol 100 MG tablet Commonly known as: ZYLOPRIM Take 1 tablet (100 mg total) by mouth daily.   aspirin EC 325 MG tablet Take 1 tablet (325 mg total) daily by mouth.   Blood Glucose System Pak Kit Use as directed to monitor FSBS 1x daily. Dx: E11.9.   Dexilant 60 MG capsule Generic drug: dexlansoprazole TAKE 1 CAPSULE(60 MG) BY MOUTH DAILY   furosemide 20 MG tablet Commonly known as: LASIX TAKE 3 TABLETS BY MOUTH DAILY   glipiZIDE 2.5 MG 24 hr tablet Commonly known as: GLUCOTROL XL TAKE 1 TABLET(2.5 MG) BY MOUTH DAILY WITH BREAKFAST   isosorbide mononitrate 60 MG 24 hr tablet Commonly known as: IMDUR TAKE 1 TABLET BY MOUTH DAILY   levothyroxine 200 MCG tablet Commonly known as: SYNTHROID Take 1 tablet (200 mcg total) by mouth daily before breakfast.   loratadine 10 MG tablet Commonly known as: CLARITIN Take 1 tablet (  10 mg total) by mouth daily as needed for allergies.   Magnesium Oxide 400 (240 Mg) MG Tabs TAKE 1 TABLET BY MOUTH TWICE DAILY   metFORMIN 500 MG tablet Commonly known as: GLUCOPHAGE TAKE 1 TABLET BY MOUTH TWICE DAILY   nitroGLYCERIN 0.4 MG SL tablet Commonly known as: NITROSTAT PLACE 1 TABLET UNDER THE TONGUE EVERY 5 MINUTES AS NEEDED FOR CHEST PAIN. CALL 911 AT THIRD DOSE IN 15 MINUTES   oxyCODONE-acetaminophen 10-325 MG tablet Commonly known as: PERCOCET Take 1 tablet by mouth every 8 (eight) hours as needed for pain.   potassium chloride SA 20 MEQ tablet Commonly known as: KLOR-CON Take 1 tablet (20 mEq total) by mouth daily.   simvastatin 40 MG tablet Commonly known as: ZOCOR TAKE 1 TABLET(40 MG) BY MOUTH DAILY   sotalol 160 MG tablet Commonly known as: BETAPACE TAKE 1 TABLET(160 MG) BY MOUTH TWICE DAILY   tamsulosin 0.4 MG Caps capsule Commonly known as: FLOMAX Take 1 capsule (0.4 mg total) by mouth daily.   Vitamin D (Ergocalciferol) 1.25 MG (50000 UNIT) Caps capsule Commonly known as: DRISDOL Take 1 capsule  (50,000 Units total) by mouth every 7 (seven) days. x6 months, then D/C.       Allergies:  Allergies  Allergen Reactions  . Trazodone And Nefazodone     Nightmares  . Lactose Intolerance (Gi) Other (See Comments)    UPSET STOMACH     Family History  Problem Relation Age of Onset  . Hypertension Mother        Cerebrovascular disease  . Diabetes Mother   . Coronary artery disease Father 49  . Diabetes type II Father   . Hypertension Father   . Heart attack Father   . Diabetes Brother   . Hypertension Brother   . Lung cancer Paternal Uncle   . Diabetes Sister   . Hypertension Sister   . Heart attack Sister 43  . Cancer Sister        leukemia  . Cancer Maternal Uncle        breast  . Cancer Maternal Grandmother        breast    Social History:  reports that he quit smoking about 7 years ago. His smoking use included cigarettes. He started smoking about 47 years ago. He has a 40.00 pack-year smoking history. He has never used smokeless tobacco. He reports that he does not drink alcohol and does not use drugs.  ROS: A complete review of systems was performed.  All systems are negative except for pertinent findings as noted.  Physical Exam:  Vital signs in last 24 hours: There were no vitals taken for this visit. Constitutional:  Alert and oriented, No acute distress Cardiovascular: Regular rate  Respiratory: Normal respiratory effort GI: Abdomen is soft, nontender, nondistended, no abdominal masses. No CVAT.  Genitourinary: Normal male phallus, testes are descended bilaterally and non-tender and without masses, scrotum is normal in appearance without lesions or masses, perineum is normal on inspection. Lymphatic: No lymphadenopathy Neurologic: Grossly intact, no focal deficits Psychiatric: Normal mood and affect  Laboratory Data:  No results for input(s): WBC, HGB, HCT, PLT in the last 72 hours.  No results for input(s): NA, K, CL, GLUCOSE, BUN, CALCIUM, CREATININE  in the last 72 hours.  Invalid input(s): CO3   No results found for this or any previous visit (from the past 24 hour(s)). No results found for this or any previous visit (from the past 240 hour(s)).  Renal Function:  No results for input(s): CREATININE in the last 168 hours. CrCl cannot be calculated (Patient's most recent lab result is older than the maximum 21 days allowed.).  Radiologic Imaging: No results found.  The patient left before being seen

## 2019-11-26 ENCOUNTER — Ambulatory Visit: Payer: Medicare Other | Admitting: "Endocrinology

## 2019-11-27 ENCOUNTER — Other Ambulatory Visit: Payer: Self-pay | Admitting: *Deleted

## 2019-11-27 NOTE — Telephone Encounter (Signed)
Received call from patient.   Requested refill on Oxycodone/APAP.  Ok to refill??  Last office visit 08/25/2019.  Last refill 10/27/2019.

## 2019-11-28 ENCOUNTER — Other Ambulatory Visit: Payer: Self-pay | Admitting: Family Medicine

## 2019-11-28 MED ORDER — OXYCODONE-ACETAMINOPHEN 10-325 MG PO TABS: 1.0000 | ORAL_TABLET | Freq: Three times a day (TID) | ORAL | 0 refills | Status: DC | PRN

## 2019-12-01 ENCOUNTER — Telehealth: Payer: Self-pay

## 2019-12-01 NOTE — Telephone Encounter (Signed)
-----   Message from Franchot Gallo, MD sent at 11/29/2019  7:30 AM EDT ----- Notify pt--PSA now 1.4--stable ----- Message ----- From: Dorisann Frames, RN Sent: 11/26/2019  11:18 AM EDT To: Franchot Gallo, MD  Pt was scheduled to see you 6/29 I rescheduled with pt to 8/3 due to surgery schedule for you. Here is his PSA result

## 2019-12-01 NOTE — Telephone Encounter (Signed)
Wife notified of PSA results.

## 2019-12-02 ENCOUNTER — Ambulatory Visit: Payer: Medicare Other | Admitting: Urology

## 2019-12-11 ENCOUNTER — Other Ambulatory Visit: Payer: Self-pay | Admitting: "Endocrinology

## 2019-12-18 DIAGNOSIS — G4733 Obstructive sleep apnea (adult) (pediatric): Secondary | ICD-10-CM | POA: Diagnosis not present

## 2019-12-24 DIAGNOSIS — E890 Postprocedural hypothyroidism: Secondary | ICD-10-CM | POA: Diagnosis not present

## 2019-12-25 LAB — T4, FREE: Free T4: 1.5 ng/dL (ref 0.8–1.8)

## 2019-12-25 LAB — TSH: TSH: 12.05 mIU/L — ABNORMAL HIGH (ref 0.40–4.50)

## 2019-12-28 ENCOUNTER — Other Ambulatory Visit: Payer: Self-pay | Admitting: "Endocrinology

## 2019-12-28 ENCOUNTER — Other Ambulatory Visit: Payer: Self-pay | Admitting: Family Medicine

## 2019-12-28 DIAGNOSIS — E1169 Type 2 diabetes mellitus with other specified complication: Secondary | ICD-10-CM

## 2019-12-28 DIAGNOSIS — E890 Postprocedural hypothyroidism: Secondary | ICD-10-CM

## 2019-12-29 ENCOUNTER — Other Ambulatory Visit: Payer: Self-pay | Admitting: *Deleted

## 2019-12-29 MED ORDER — OXYCODONE-ACETAMINOPHEN 10-325 MG PO TABS: 1.0000 | ORAL_TABLET | Freq: Three times a day (TID) | ORAL | 0 refills | Status: DC | PRN

## 2019-12-29 NOTE — Telephone Encounter (Signed)
Received call from patient.   Requested refill on Oxycodone/APAP.   Ok to refill??  Last office visit 08/25/2019.  Last refill 11/28/2019.

## 2020-01-01 ENCOUNTER — Encounter: Payer: Self-pay | Admitting: Nurse Practitioner

## 2020-01-01 ENCOUNTER — Other Ambulatory Visit: Payer: Self-pay

## 2020-01-01 ENCOUNTER — Telehealth: Payer: Self-pay

## 2020-01-01 ENCOUNTER — Ambulatory Visit (INDEPENDENT_AMBULATORY_CARE_PROVIDER_SITE_OTHER): Payer: Medicare Other | Admitting: Nurse Practitioner

## 2020-01-01 ENCOUNTER — Telehealth: Payer: Self-pay | Admitting: Urology

## 2020-01-01 VITALS — BP 103/68 | HR 75 | Ht 72.0 in | Wt 319.0 lb

## 2020-01-01 DIAGNOSIS — E1165 Type 2 diabetes mellitus with hyperglycemia: Secondary | ICD-10-CM | POA: Diagnosis not present

## 2020-01-01 DIAGNOSIS — I10 Essential (primary) hypertension: Secondary | ICD-10-CM

## 2020-01-01 DIAGNOSIS — E890 Postprocedural hypothyroidism: Secondary | ICD-10-CM

## 2020-01-01 DIAGNOSIS — E559 Vitamin D deficiency, unspecified: Secondary | ICD-10-CM | POA: Diagnosis not present

## 2020-01-01 DIAGNOSIS — E782 Mixed hyperlipidemia: Secondary | ICD-10-CM | POA: Diagnosis not present

## 2020-01-01 DIAGNOSIS — C61 Malignant neoplasm of prostate: Secondary | ICD-10-CM

## 2020-01-01 LAB — POCT GLYCOSYLATED HEMOGLOBIN (HGB A1C): Hemoglobin A1C: 7 % — AB (ref 4.0–5.6)

## 2020-01-01 MED ORDER — TAMSULOSIN HCL 0.4 MG PO CAPS: 0.4000 mg | ORAL_CAPSULE | Freq: Two times a day (BID) | ORAL | 0 refills | Status: AC

## 2020-01-01 NOTE — Progress Notes (Signed)
01/01/2020                                                 Endocrinology Follow Up  Subjective:    Patient ID: Peter Dunlap, male    DOB: 10/05/1955, PCP [name], MD  Patient presents today for follow-up of hypothyroidism following radioactive iodine treatment, and type 2 diabetes.  Past Medical History:  Diagnosis Date  . Arteriosclerotic cardiovascular disease (ASCVD) 2005   catheterization in 10/2010:50% mid LAD, diffuse distal disease, circumflex irregularities, large dominant RCA with a 50% ostial, 70% distal, 60% posterolateral and 70% PDA; normal EF  . Arthritis   . Benign prostatic hypertrophy   . Bilateral carpal tunnel syndrome 07/03/2018  . Cerebrovascular disease 2010   R. carotid endarterectomy; Duplex in 10/2010-widely patent ICAs, subtotal left vertebral-not thought to be contributing to symptoms  . Cervical spine disease    CT in 2012-advanced degeneration and spondylosis with moderate spinal stenosis at C3-C6  . Depression   . Erectile dysfunction   . Gastroesophageal reflux disease   . H/O hiatal hernia   . H/O: substance abuse ([place])    Cocaine, marijuana, alcohol.  Quit 2013.   [name] Hyperlipidemia   . Hypertension   . Non-ST elevation myocardial infarction (NSTEMI), initial episode of care [facility]) 12/02/2013   DES LAD  . Obesity   . Prostate cancer ([place])   . Sleep apnea    CPAP  . Tachy-brady syndrome ([place])    a. s/p STJ dual chamber PPM   . Thyroid disease   . Tobacco abuse    Quit 2014  . 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 BRAIN SURGERY  2015   hematoma evacuation  . BURR HOLE Right 04/13/2014   Procedure: Haskell Flirt;  Surgeon: Charlie Pitter, MD;  Location: [facility] NEURO ORS;  Service: Neurosurgery;  Laterality: Right;  . CAROTID ENDARTERECTOMY Right Feb. 25, 2010    CEA  . CORONARY ANGIOPLASTY WITH STENT PLACEMENT  12/03/2013   LAD 90%-->0% W/ Promus Premier DES 3.0 mm x 16 mm,  CFX OK, RCA 40%, EF 70-75%  . LEFT ATRIAL APPENDAGE OCCLUSION N/A 08/05/2015   Procedure: LEFT ATRIAL APPENDAGE OCCLUSION;  Surgeon: [name], MD;  Location: [facility] CV LAB;  Service: Cardiovascular;  Laterality: N/A;  . LEFT HEART CATHETERIZATION WITH CORONARY ANGIOGRAM Left 12/03/2013   Procedure: LEFT HEART CATHETERIZATION WITH CORONARY ANGIOGRAM;  Surgeon: Leonie Man, MD;  Location: [facility] CATH LAB;  Service: Cardiovascular;  Laterality: Left;  . LEFT HEART CATHETERIZATION WITH CORONARY ANGIOGRAM N/A 01/26/2014   Procedure: LEFT HEART CATHETERIZATION WITH CORONARY ANGIOGRAM;  Surgeon: Jettie Booze, MD;  Location: [facility] CATH LAB;  Service: Cardiovascular;  Laterality: N/A;  . LEFT HEART CATHETERIZATION WITH CORONARY ANGIOGRAM N/A 08/03/2014   Procedure: LEFT HEART CATHETERIZATION WITH CORONARY ANGIOGRAM;  Surgeon: Burnell Blanks, MD;  Location: [facility] CATH LAB;  Service: Cardiovascular;  Laterality: N/A;  . PERCUTANEOUS CORONARY STENT INTERVENTION (PCI-S)  12/03/2013   Procedure: PERCUTANEOUS CORONARY STENT INTERVENTION (PCI-S);  Surgeon: Leonie Man, MD;  Location: [facility] CATH LAB;  Service: Cardiovascular;;  . PERMANENT PACEMAKER INSERTION N/A 09/18/2014   Procedure: PERMANENT PACEMAKER INSERTION;  Surgeon: [name], MD;  Location: [facility] CATH LAB;  Service: Cardiovascular;  Laterality: N/A;  .  RADIOFREQUENCY ABLATION  2005   for PSVT  . TEE WITHOUT CARDIOVERSION N/A 07/27/2015   Procedure: TRANSESOPHAGEAL ECHOCARDIOGRAM (TEE);  Surgeon: Lelon Perla, MD;  Location: [facility] ENDOSCOPY;  Service: Cardiovascular;  Laterality: N/A;  . TEE WITHOUT CARDIOVERSION N/A 09/15/2015   Procedure: TRANSESOPHAGEAL ECHOCARDIOGRAM (TEE);  Surgeon: Thayer Headings, MD;  Location: [facility] ENDOSCOPY;  Service: Cardiovascular;  Laterality: N/A;   Social History   Socioeconomic History  . Marital status: Married    Spouse name: Not on file  . Number of children: 0  . Years of education: Not on file  .  Highest education level: Not on file  Occupational History  . Occupation: Retired  Tobacco Use  . Smoking status: Former Smoker    Packs/day: 1.00    Years: 40.00    Pack years: 40.00    Types: Cigarettes    Start date: 10/20/1972    Quit date: 10/10/2012    Years since quitting: 7.2  . Smokeless tobacco: Never Used  . Tobacco comment: Quit in May.   Vaping Use  . Vaping Use: Never used  Substance and Sexual Activity  . Alcohol use: No    Alcohol/week: 0.0 standard drinks    Comment: former drinker-- sober since 2013.   . Drug use: No    Types: Cocaine    Comment: quit cocaine 10/2011  . Sexual activity: Yes    Partners: Female  Other Topics Concern  . Not on file  Social History Narrative   Lives in [place].   Social Determinants of Health   Financial Resource Strain:   . Difficulty of Paying Living Expenses:   Food Insecurity:   . Worried About Charity fundraiser in the Last Year:   . Arboriculturist in the Last Year:   Transportation Needs:   . Film/video editor (Medical):   [name] Lack of Transportation (Non-Medical):   Physical Activity:   . Days of Exercise per Week:   . Minutes of Exercise per Session:   Stress:   . Feeling of Stress :   Social Connections:   . Frequency of Communication with Friends and Family:   . Frequency of Social Gatherings with Friends and Family:   . Attends Religious Services:   . Active Member of Clubs or Organizations:   . Attends Archivist Meetings:   [name] Marital Status:    Outpatient Encounter Medications as of 01/01/2020  Medication Sig  . allopurinol (ZYLOPRIM) 100 MG tablet Take 1 tablet (100 mg total) by mouth daily.  [name] aspirin EC 325 MG tablet Take 1 tablet (325 mg total) daily by mouth.  . Blood Glucose Monitoring Suppl (BLOOD GLUCOSE SYSTEM PAK) KIT Use as directed to monitor FSBS 1x daily. Dx: E11.9.  . DEXILANT 60 MG capsule TAKE 1 CAPSULE(60 MG) BY MOUTH DAILY  . furosemide (LASIX) 20 MG tablet TAKE 3  TABLETS BY MOUTH DAILY  . glipiZIDE (GLUCOTROL XL) 2.5 MG 24 hr tablet TAKE 1 TABLET(2.5 MG) BY MOUTH DAILY WITH BREAKFAST  . isosorbide mononitrate (IMDUR) 60 MG 24 hr tablet TAKE 1 TABLET BY MOUTH DAILY  . levothyroxine (SYNTHROID) 200 MCG tablet TAKE 1 TABLET(200 MCG) BY MOUTH DAILY BEFORE BREAKFAST  . loratadine (CLARITIN) 10 MG tablet Take 1 tablet (10 mg total) by mouth daily as needed for allergies.  . Magnesium Oxide 400 (240 Mg) MG TABS TAKE 1 TABLET BY MOUTH TWICE DAILY  . MAGNESIUM-OXIDE 400 (241.3 Mg) MG tablet Take 1 tablet by  mouth 2 (two) times daily.  . metFORMIN (GLUCOPHAGE) 500 MG tablet TAKE 1 TABLET BY MOUTH TWICE DAILY  . nitroGLYCERIN (NITROSTAT) 0.4 MG SL tablet PLACE 1 TABLET UNDER THE TONGUE EVERY 5 MINUTES AS NEEDED FOR CHEST PAIN. CALL 911 AT THIRD DOSE IN 15 MINUTES  . ONETOUCH ULTRA test strip USE TO TEST ONCE DAILY  . oxyCODONE-acetaminophen (PERCOCET) 10-325 MG tablet Take 1 tablet by mouth every 8 (eight) hours as needed for pain.  . potassium chloride SA (KLOR-CON) 20 MEQ tablet Take 1 tablet (20 mEq total) by mouth daily.  . simvastatin (ZOCOR) 40 MG tablet TAKE 1 TABLET(40 MG) BY MOUTH DAILY  . sotalol (BETAPACE) 160 MG tablet TAKE 1 TABLET(160 MG) BY MOUTH TWICE DAILY  . tamsulosin (FLOMAX) 0.4 MG CAPS capsule Take 1 capsule (0.4 mg total) by mouth daily.  . Vitamin D, Ergocalciferol, (DRISDOL) 1.25 MG (50000 UNIT) CAPS capsule Take 1 capsule (50,000 Units total) by mouth every 7 (seven) days.   No facility-administered encounter medications on file as of 01/01/2020.   ALLERGIES: Allergies  Allergen Reactions  . Trazodone And Nefazodone     Nightmares  . Lactose Intolerance (Gi) Other (See Comments)    UPSET STOMACH    VACCINATION STATUS: Immunization History  Administered Date(s) Administered  . Influenza,inj,Quad PF,6+ Mos 02/17/2014, 03/09/2015, 03/10/2016, 03/27/2017, 03/20/2018, 03/04/2019  . Moderna SARS-COVID-2 Vaccination 08/03/2019,  09/06/2019  . Pneumococcal Polysaccharide-23 01/05/2014  . Tdap 12/19/2010    Diabetes He presents for his follow-up diabetic visit. He has type 2 diabetes mellitus. His disease course has been stable. There are no hypoglycemic associated symptoms. Pertinent negatives for hypoglycemia include no headaches or tremors. Pertinent negatives for diabetes include no chest pain, no fatigue, no foot paresthesias, no polydipsia and no polyuria. There are no hypoglycemic complications. Symptoms are stable. Diabetic complications include heart disease and impotence. Risk factors for coronary artery disease include sedentary lifestyle, hypertension, diabetes mellitus, dyslipidemia, male sex, obesity and tobacco exposure. Current diabetic treatment includes oral agent (dual therapy). He is compliant with treatment most of the time. His weight is fluctuating minimally. He is following a generally unhealthy diet. When asked about meal planning, he reported none. He has not had a previous visit with a dietitian. He never participates in exercise. His home blood glucose trend is fluctuating minimally. (Patient presents today without his meter or logs. His point-of-care A1c today is 7% overall slightly worse from last visits A1c of 6.8% in March 2021. He denies any symptoms of hypoglycemia.) An ACE inhibitor/angiotensin II receptor blocker is not being taken. He does not see a podiatrist.Eye exam is not current.  Thyroid Problem Presents for follow-up visit. Patient reports no cold intolerance, constipation, diarrhea, fatigue, heat intolerance or tremors.  He was given RAI therapy on March 16, 2014 .  He is currently on levothyroxine 200 mcg p.o. every morning.  He reports better compliance, and ensures he is taking his thyroid medication separate from everything else. He has no new complaints today.  Continues to feel better.  -He denies cold intolerance, constipation, fatigue.  He has a steady body weight since last  visit.   Mr. Bostic is a 64 year old male patient  with medical history as above.  -He is being engaged in follow-up visit for the care of his type 2 diabetes, RAI induced hypothyroidism, hyperlipidemia, hypertension.   - he denies family history of thyroid dysfunction . He denies personal history of goiter. he has hx of a-fib, and CAD which required stent  placement x 2.   Review of Systems  Constitutional: Negative for appetite change and fatigue.  Cardiovascular: Negative for chest pain.  Gastrointestinal: Negative for constipation and diarrhea.  Endocrine: Negative for cold intolerance, heat intolerance, polydipsia and polyuria.  Genitourinary: Positive for impotence.  Neurological: Negative for tremors, numbness and headaches.  All other systems reviewed and are negative.    Objective:    BP 103/68 (BP Location: Right Arm, Patient Position: Sitting)   Pulse 75   Ht 6' (1.829 m)   Wt (!) 319 lb (144.7 kg)   BMI 43.26 kg/m   Wt Readings from Last 3 Encounters:  01/01/20 (!) 319 lb (144.7 kg)  11/18/19 (!) 315 lb (142.9 kg)  08/25/19 (!) 320 lb (145.2 kg)      Physical Exam- Limited  Constitutional:  Body mass index is 43.26 kg/m. , not in acute distress, normal state of mind Eyes:  EOMI, no exophthalmos Neck: Supple Thyroid: No gross goiter Cardiovascular: RRR, no murmers, rubs, or gallops, no edema Respiratory: Adequate breathing efforts, no crackles, rales, rhonchi, or wheezing Musculoskeletal: no gross deformities, strength intact in all four extremities, no gross restriction of joint movements Skin:  no rashes, no hyperemia Neurological: no tremor with outstretched hands  Foot exam:   No rashes, ulcers, cuts, calluses, + onychodystrophy.   Good pulses bilat.  Good sensation to 10 g monofilament bilat.    Diabetic Labs (most recent): Lab Results  Component Value Date   HGBA1C 7.0 (A) 01/01/2020   HGBA1C 6.8 (H) 08/25/2019   HGBA1C 7.5 (H) 05/19/2019      Lipid Panel ( most recent) Lipid Panel     Component Value Date/Time   CHOL 124 05/16/2019 0817   TRIG 122 05/16/2019 0817   HDL 35 (L) 05/16/2019 0817   CHOLHDL 3.5 05/16/2019 0817   VLDL 28 10/17/2016 0819   LDLCALC 69 05/16/2019 0817    Recent Results (from the past 2160 hour(s))  PSA     Status: None   Collection Time: 11/10/19  8:52 AM  Result Value Ref Range   PSA 1.4 < OR = 4.0 ng/mL    Comment: The total PSA value from this assay system is  standardized against the WHO standard. The test  result will be approximately 20% lower when compared  to the equimolar-standardized total PSA (Beckman  Coulter). Comparison of serial PSA results should be  interpreted with this fact in mind. . This test was performed using the Siemens  chemiluminescent method. Values obtained from  different assay methods cannot be used interchangeably. PSA levels, regardless of value, should not be interpreted as absolute evidence of the presence or absence of disease.   CUP PACEART REMOTE DEVICE CHECK     Status: None   Collection Time: 11/12/19  3:36 AM  Result Value Ref Range   Date Time Interrogation Session [id number]    Pulse Generator Manufacturer [employer]    Pulse Gen Model 2240 Assurity    Pulse Gen Serial Number [id number]    Clinic Name [facility] Pulse Generator Type Implantable Pulse Generator    Implantable Pulse Generator Implant Date [id number]    Implantable Lead Manufacturer [employer]    Implantable Lead Model 1688TC Tendril SDX    Implantable Lead Serial Number [id number]    Implantable Lead Implant Date [id number]    Implantable Lead Location Detail 1 UNKNOWN    Implantable Lead Location [id number]    Implantable Lead Manufacturer [facility]    Implantable  Lead Model 1888TC Tendril ST Optim    Implantable Lead Serial Number [id number]    Implantable Lead Implant Date [id number]    Implantable Lead Location Detail 1 UNKNOWN    Implantable Lead Location [id number]     Lead Channel Setting Sensing Sensitivity 2.0 mV   Lead Channel Setting Sensing Adaptation Mode Fixed Pacing    Lead Channel Setting Pacing Amplitude 2.0 V   Lead Channel Setting Pacing Pulse Width 0.5 ms   Lead Channel Setting Pacing Amplitude 2.5 V   Lead Channel Status NULL    Lead Channel Impedance Value 440 ohm   Lead Channel Sensing Intrinsic Amplitude 4.9 mV   Lead Channel Pacing Threshold Amplitude 0.5 V   Lead Channel Pacing Threshold Pulse Width 0.5 ms   Lead Channel Status NULL    Lead Channel Impedance Value 560 ohm   Lead Channel Sensing Intrinsic Amplitude 12.0 mV   Lead Channel Pacing Threshold Amplitude 0.5 V   Lead Channel Pacing Threshold Pulse Width 0.5 ms   Battery Status MOS    Battery Remaining Longevity 128 mo   Battery Remaining Percentage 95.5 %   Battery Voltage 2.98 V   Brady Statistic RA Percent Paced 2.8 %   Brady Statistic RV Percent Paced 1.0 %   Brady Statistic AP VP Percent 1.0 %   Brady Statistic AS VP Percent 1.0 %   Brady Statistic AP VS Percent 3.0 %   Brady Statistic AS VS Percent 97.0 %  T4, free SOLSTAS     Status: None   Collection Time: 12/24/19  8:35 AM  Result Value Ref Range   Free T4 1.5 0.8 - 1.8 ng/dL  TSH SOLSTAS     Status: Abnormal   Collection Time: 12/24/19  8:35 AM  Result Value Ref Range   TSH 12.05 (H) 0.40 - 4.50 mIU/L  HgB A1c     Status: Abnormal   Collection Time: 01/01/20 10:46 AM  Result Value Ref Range   Hemoglobin A1C 7.0 (A) 4.0 - 5.6 %   HbA1c POC (<> result, manual entry)     HbA1c, POC (prediabetic range)     HbA1c, POC (controlled diabetic range)       Assessment & Plan:   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 Patient has had problems with consistency of taking medications. Therefore given the normal free T4, advised him to continue at same dose of levothyroxine at 200 mcg p.o. every morning before  breakfast. Repeat thyroid function test will be done prior to next visit.   - We discussed about the correct intake of his thyroid hormone, on empty stomach at fasting, with water, separated by at least 30 minutes from breakfast and other medications,  and separated by more than 4 hours from calcium, iron, multivitamins, acid reflux medications (PPIs). -Patient is made aware of the fact that thyroid hormone replacement is needed for life, dose to be adjusted by periodic monitoring of thyroid function tests.   2. Diabetes mellitus type 2 in obese [facility])  -Patient presents today without his meter or logs. His point-of-care A1c today is 7% overall slightly worse from last visits A1c of 6.8% in March 2021. He denies any symptoms of hypoglycemia.    -Given the lack of logs and meter to review today, no changes were made to his medications. He is advised to continue Metformin 500 mg p.o. twice daily with meals,  and glipizide 2.5 mg daily with breakfast. He is advised to monitor blood glucose at least one time a day, before breakfast, and as needed throughout the day.  - The patient admits there is a room for improvement in their diet and drink choices. -  Suggestion is made for the patient to avoid simple carbohydrates from their diet including Cakes, Sweet Desserts / Pastries, Ice Cream, Soda (diet and regular), Sweet Tea, Candies, Chips, Cookies, Sweet Pastries,  Store Bought Juices, Alcohol in Excess of  1-2 drinks a day, Artificial Sweeteners, Coffee Creamer, and "Sugar-free" Products. This will help patient to have stable blood glucose profile and potentially avoid unintended weight gain.   - I encouraged the patient to switch to  unprocessed or minimally processed complex starch and increased protein intake (animal or plant source), fruits, and vegetables.   - Patient is advised to stick to a routine mealtimes to eat 3 meals  a day and avoid unnecessary snacks ( to snack only to correct  hypoglycemia).   3. Essential hypertension His blood pressure is controlled to target. He is advised to continue furosemide 60 mg p.o. daily. Encouraged patient to monitor blood pressure at home and notify clinic if blood pressure readings are greater than 140/90 on 2 separate occasions.  4.  Hyperlipidemia:  His most recent lipid panel on 05/16/2019 shows controlled LDL at 69. He is advised to continue simvastatin 40 mg p.o. daily at night. Side effects and precautions discussed with him. Will recheck lipid panel prior to next visit.  5. Vitamin D Deficiency: His most recent vitamin D level on 08/25/2019 was below target at 21. He has recently completed replacement therapy of ergocalciferol 50,000 units p.o. every 7 days x 12 weeks. We will recheck vitamin D levels prior to next visit.   - I advised patient to maintain close follow up with [name], MD for primary care needs.   -- Time spent on this patient care encounter:  35 min, of which > 50% was spent in  counseling and the rest reviewing his blood glucose logs , discussing his hypoglycemia and hyperglycemia episodes, reviewing his current and  previous labs / studies  ( including abstraction from other facilities) and medications  doses and developing a  long term treatment plan and documenting his care.   Please refer to Patient Instructions for Blood Glucose Monitoring and Insulin/Medications Dosing Guide"  in media tab for additional information. Please  also refer to " Patient Self Inventory" in the Media  tab for reviewed elements of pertinent patient history.  [name] participated in the discussions, expressed understanding, and voiced agreement with the above plans.  All questions were answered to his satisfaction. he is encouraged to contact clinic should he have any questions or concerns prior to his return visit.   Follow up plan: Return in about 3 months (around 04/02/2020) for Diabetes follow up with A1c in  office, Thyroid follow up, Bring glucometer and logs, Previsit labs.  Rayetta Pigg, FNP-BC [facility] Phone: [phone] Fax: [phone] .  01/01/2020, 12:12 PM

## 2020-01-01 NOTE — Telephone Encounter (Signed)
Pt came by office and states he is having a issue getting refill on prostate medication at [place].

## 2020-01-01 NOTE — Telephone Encounter (Signed)
Wife made aware of new rx sent in to pharmacy.

## 2020-01-01 NOTE — Patient Instructions (Signed)

## 2020-01-01 NOTE — Telephone Encounter (Signed)
Wife notifed. New rx sent in for 30 days. Next appt. 8/3

## 2020-01-06 ENCOUNTER — Other Ambulatory Visit: Payer: Self-pay

## 2020-01-06 ENCOUNTER — Encounter: Payer: Self-pay | Admitting: Urology

## 2020-01-06 ENCOUNTER — Ambulatory Visit (INDEPENDENT_AMBULATORY_CARE_PROVIDER_SITE_OTHER): Payer: Medicare Other | Admitting: Urology

## 2020-01-06 VITALS — BP 126/74 | HR 78 | Temp 98.2°F | Ht 72.0 in | Wt 317.0 lb

## 2020-01-06 DIAGNOSIS — C61 Malignant neoplasm of prostate: Secondary | ICD-10-CM

## 2020-01-06 MED ORDER — SULFAMETHOXAZOLE-TRIMETHOPRIM 800-160 MG PO TABS: 1.0000 | ORAL_TABLET | Freq: Once | ORAL | 0 refills | Status: AC

## 2020-01-06 MED ORDER — LEVOFLOXACIN 750 MG PO TABS: 750.0000 mg | ORAL_TABLET | Freq: Every day | ORAL | 0 refills | Status: DC

## 2020-01-06 NOTE — Progress Notes (Signed)
H&P  Chief Complaint: History of PCa, BPH w/LUTS  History of Present Illness:  8.3.2021: Pt reports recent/persistent gross hematuria - beginning of stream but notes that his FOS is strong. Pt reports no other significant concerns at this time.  PSA checked in June at the time of his visit was up to 1.4, representing at least a second or third PSA increase.  IPSS Questionnaire (AUA-7): Over the past month.   1)  How often have you had a sensation of not emptying your bladder completely after you finish urinating?  3 - About half the time  2)  How often have you had to urinate again less than two hours after you finished urinating? 4 - More than half the time  3)  How often have you found you stopped and started again several times when you urinated?  2 - Less than half the time  4) How difficult have you found it to postpone urination?  3 - About half the time  5) How often have you had a weak urinary stream?  2 - Less than half the time  6) How often have you had to push or strain to begin urination?  3 - About half the time  7) How many times did you most typically get up to urinate from the time you went to bed until the time you got up in the morning?  4 - 4 times  Total score:  0-7 mildly symptomatic   8-19 moderately symptomatic   20-35 severely symptomatic    (below copied from [place] records):    Hematuria:   He first noticed the symptoms approximately 10/27/2016.   Prior to his 5.29.2018 appointment he began having dysuria and gross hematuria, initially in his stream. It was felt that he had a urinary tract infection. Urine culture was negative, but he did clear with an antibiotic.   9.15.2020: He has only had one instance of hematuria since last visit, which occurred last month. He states that he had a single clot per his urine that had a little bit of blood afterwards,   Prostate cancer:     He underwent TRUS/Bx on 8.15.2017. At that time, PSA was 9.09.  Prostatic volume was 25 cc. 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   9.15.2020: PSA 0.8.   BPH:  Patient is currently treated with Rapaflo for his symptoms.   4.16.2019: He still has moderate voiding symptomatology despite being on Rapaflo. No gross hematuria or dysuria, however.  IPSS 25  Rapaflo increased to BID.   10.22.2019: Current IPSS 27 QOl score 2--only on 1 Rapaflo a day   1.27.2020: The patient is still having significant lower urinary tract symptoms. Rapaflo increased to Q 12 hrs   5.12.2020: IPSS is 21. Quality of life score 2. He is on generic Rapaflo twice a day.   9.15.2020: He reports that his only major urinary issue is that his stream is somewhat slow to start. He also notes that he has occasional dysuria but this is not too bothersome for him. He continues on tamsulosin 2x a day. He has variable nocturia depending on his evening fluid intake.   Past Medical History:  Diagnosis Date  . Arteriosclerotic cardiovascular disease (ASCVD) 2005   catheterization in 10/2010:50% mid  LAD, diffuse distal disease, circumflex irregularities, large dominant RCA with a 50% ostial, 70% distal, 60% posterolateral and 70% PDA; normal EF  . Arthritis   . Benign prostatic hypertrophy   . Bilateral carpal tunnel syndrome 07/03/2018  . Cerebrovascular disease 2010   R. carotid endarterectomy; Duplex in 10/2010-widely patent ICAs, subtotal left vertebral-not thought to be contributing to symptoms  . Cervical spine disease    CT in 2012-advanced degeneration and spondylosis with moderate spinal stenosis at C3-C6  . Depression   . Erectile dysfunction   . Gastroesophageal reflux disease   . H/O hiatal hernia   . H/O: substance abuse ([place])    Cocaine, marijuana, alcohol.  Quit 2013.     [name] Hyperlipidemia   . Hypertension   . Non-ST elevation myocardial infarction (NSTEMI), initial episode of care [facility]) 12/02/2013   DES LAD  . Obesity   . Prostate cancer ([place])   . Sleep apnea    CPAP  . Tachy-brady syndrome ([place])    a. s/p STJ dual chamber PPM   . Thyroid disease   . Tobacco abuse    Quit 2014  . 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 BRAIN SURGERY  2015   hematoma evacuation  . BURR HOLE Right 04/13/2014   Procedure: Haskell Flirt;  Surgeon: Charlie Pitter, MD;  Location: [place] [facility];  Service: Neurosurgery;  Laterality: Right;  . CAROTID ENDARTERECTOMY Right Feb. 25, 2010    CEA  . CORONARY ANGIOPLASTY WITH STENT PLACEMENT  12/03/2013   LAD 90%-->0% W/ Promus Premier DES 3.0 mm x 16 mm, CFX OK, RCA 40%, EF 70-75%  . LEFT ATRIAL APPENDAGE OCCLUSION N/A 08/05/2015   Procedure: LEFT ATRIAL APPENDAGE OCCLUSION;  Surgeon: [name], MD;  Location: [place] CV LAB;  Service: Cardiovascular;  Laterality: N/A;  . LEFT HEART CATHETERIZATION WITH CORONARY ANGIOGRAM Left 12/03/2013   Procedure: LEFT HEART CATHETERIZATION WITH CORONARY ANGIOGRAM;  Surgeon: Leonie Man, MD;  Location: [facility] CATH LAB;  Service: Cardiovascular;  Laterality: Left;  . LEFT HEART CATHETERIZATION WITH CORONARY ANGIOGRAM N/A 01/26/2014   Procedure: LEFT HEART CATHETERIZATION WITH CORONARY ANGIOGRAM;  Surgeon: Jettie Booze, MD;  Location: [facility] CATH LAB;  Service: Cardiovascular;  Laterality: N/A;  . LEFT HEART CATHETERIZATION WITH CORONARY ANGIOGRAM N/A 08/03/2014   Procedure: LEFT HEART CATHETERIZATION WITH CORONARY ANGIOGRAM;  Surgeon: Burnell Blanks, MD;  Location: [facility] CATH LAB;  Service: Cardiovascular;  Laterality: N/A;  . PERCUTANEOUS CORONARY STENT INTERVENTION (PCI-S)  12/03/2013   Procedure: PERCUTANEOUS CORONARY STENT INTERVENTION (PCI-S);  Surgeon: Leonie Man, MD;  Location: [facility] CATH LAB;  Service:  Cardiovascular;;  . PERMANENT PACEMAKER INSERTION N/A 09/18/2014   Procedure: PERMANENT PACEMAKER INSERTION;  Surgeon: [name], MD;  Location: [facility] CATH LAB;  Service: Cardiovascular;  Laterality: N/A;  . RADIOFREQUENCY ABLATION  2005   for PSVT  . TEE WITHOUT CARDIOVERSION N/A 07/27/2015   Procedure: TRANSESOPHAGEAL ECHOCARDIOGRAM (TEE);  Surgeon: Lelon Perla, MD;  Location: [facility] ENDOSCOPY;  Service: Cardiovascular;  Laterality: N/A;  . TEE WITHOUT CARDIOVERSION N/A 09/15/2015   Procedure: TRANSESOPHAGEAL ECHOCARDIOGRAM (TEE);  Surgeon: Thayer Headings, MD;  Location: [place];  Service: Cardiovascular;  Laterality: N/A;    Home Medications:  Allergies as of 01/06/2020      Reactions   Trazodone And Nefazodone    Nightmares   Lactose Intolerance (gi) Other (See Comments)   UPSET STOMACH  Medication List       Accurate as of January 06, 2020 12:35 PM. If you have any questions, ask your nurse or doctor.        allopurinol 100 MG tablet Commonly known as: ZYLOPRIM Take 1 tablet (100 mg total) by mouth daily.   aspirin EC 325 MG tablet Take 1 tablet (325 mg total) daily by mouth.   Blood Glucose System Pak Kit Use as directed to monitor FSBS 1x daily. Dx: E11.9.   Dexilant 60 MG capsule Generic drug: dexlansoprazole TAKE 1 CAPSULE(60 MG) BY MOUTH DAILY   furosemide 20 MG tablet Commonly known as: LASIX TAKE 3 TABLETS BY MOUTH DAILY   glipiZIDE 2.5 MG 24 hr tablet Commonly known as: GLUCOTROL XL TAKE 1 TABLET(2.5 MG) BY MOUTH DAILY WITH BREAKFAST   isosorbide mononitrate 60 MG 24 hr tablet Commonly known as: IMDUR TAKE 1 TABLET BY MOUTH DAILY   levothyroxine 200 MCG tablet Commonly known as: SYNTHROID TAKE 1 TABLET(200 MCG) BY MOUTH DAILY BEFORE BREAKFAST   loratadine 10 MG tablet Commonly known as: CLARITIN Take 1 tablet (10 mg total) by mouth daily as needed for allergies.   Magnesium Oxide 400 (240 Mg) MG Tabs TAKE 1 TABLET BY MOUTH TWICE DAILY    MAGnesium-Oxide 400 (241.3 Mg) MG tablet Generic drug: magnesium oxide Take 1 tablet by mouth 2 (two) times daily.   metFORMIN 500 MG tablet Commonly known as: GLUCOPHAGE TAKE 1 TABLET BY MOUTH TWICE DAILY   nitroGLYCERIN 0.4 MG SL tablet Commonly known as: NITROSTAT PLACE 1 TABLET UNDER THE TONGUE EVERY 5 MINUTES AS NEEDED FOR CHEST PAIN. CALL 911 AT THIRD DOSE IN 15 MINUTES   OneTouch Ultra test strip Generic drug: glucose blood USE TO TEST ONCE DAILY   oxyCODONE-acetaminophen 10-325 MG tablet Commonly known as: PERCOCET Take 1 tablet by mouth every 8 (eight) hours as needed for pain.   potassium chloride SA 20 MEQ tablet Commonly known as: KLOR-CON Take 1 tablet (20 mEq total) by mouth daily.   simvastatin 40 MG tablet Commonly known as: ZOCOR TAKE 1 TABLET(40 MG) BY MOUTH DAILY   sotalol 160 MG tablet Commonly known as: BETAPACE TAKE 1 TABLET(160 MG) BY MOUTH TWICE DAILY   tamsulosin 0.4 MG Caps capsule Commonly known as: FLOMAX Take 1 capsule (0.4 mg total) by mouth in the morning and at bedtime.   Vitamin D (Ergocalciferol) 1.25 MG (50000 UNIT) Caps capsule Commonly known as: DRISDOL Take 1 capsule (50,000 Units total) by mouth every 7 (seven) days.       Allergies:  Allergies  Allergen Reactions  . Trazodone And Nefazodone     Nightmares  . Lactose Intolerance (Gi) Other (See Comments)    UPSET STOMACH     Family History  Problem Relation Age of Onset  . Hypertension Mother        Cerebrovascular disease  . Diabetes Mother   . Coronary artery disease Father 67  . Diabetes type II Father   . Hypertension Father   . Heart attack Father   . Diabetes Brother   . Hypertension Brother   . Lung cancer Paternal Uncle   . Diabetes Sister   . Hypertension Sister   . Heart attack Sister 76  . Cancer Sister        leukemia  . Cancer Maternal Uncle        breast  . Cancer Maternal Grandmother        breast    Social History:  reports  that he  quit smoking about 7 years ago. His smoking use included cigarettes. He started smoking about 47 years ago. He has a 40.00 pack-year smoking history. He has never used smokeless tobacco. He reports that he does not drink alcohol and does not use drugs.  ROS: A complete review of systems was performed.  All systems are negative except for pertinent findings as noted.  Physical Exam:  Vital signs in last 24 hours: There were no vitals taken for this visit. Constitutional:  Alert and oriented, No acute distress Respiratory: Normal respiratory effort Genitourinary: Normal male phallus, testes are descended bilaterally and non-tender and without masses, scrotum is normal in appearance without lesions or masses, perineum is normal on inspection. Neurologic: Grossly intact, no focal deficits Psychiatric: Normal mood and affect  I have reviewed prior pt notes  I have reviewed notes from referring/previous physicians  I have reviewed urinalysis results  I have independently reviewed prior imaging  I have reviewed prior PSA results  Impression/Assessment:  1.  Unfavorable intermediate risk prostate cancer, status post radiotherapy completing this in early 2018.  He has a rising PSA  2.  Gross hematuria, initial in nature, most likely from prostate source  3.  BPH, symptoms minimal on tamsulosin  Plan:  1. Pt reassured regarding his gross hematuria.  No further evaluation of this necessary at this point  2. Pt advised regarding elevating PSA-would recommend repeat biopsy and probable abdominal imaging after that  2. F/U with prostate biopsy at next available appointment  CC: Dr. Buelah Manis

## 2020-01-06 NOTE — Patient Instructions (Signed)
   Appointment Time: 8:15am Appointment Date: 01/27/20  Location: [facility] Department   Prostate Biopsy Instructions  Stop all aspirin or blood thinners (aspirin, plavix, coumadin, warfarin, motrin, ibuprofen, advil, aleve, naproxen, naprosyn) for 7 days prior to the procedure.  If you have any questions about stopping these medications, please contact your primary care physician or cardiologist.  Having a light meal prior to the procedure is recommended.  If you are diabetic or have low blood sugar please bring a small snack or glucose tablet.  A Fleets enema is needed to be purchased over the counter at a local pharmacy and used 2 hours before you scheduled appointment.  This can be purchased over the counter at any pharmacy.  Antibiotics will be administered in the clinic at the time of the procedure and 1 tablet has been sent to your pharmacy. Please take the antibiotic as prescribed.    Please bring someone with you to the procedure to drive you home.   If you have any questions or concerns, please feel free to call the office at [phone] or send a Mychart message.    Thank you, [facility]

## 2020-01-06 NOTE — Progress Notes (Signed)
Urological Symptom Review  Patient is experiencing the following symptoms: Have to strain to urinate Blood in urine Weak stream   Review of Systems  Gastrointestinal (upper)  : Indigestion/heartburn  Gastrointestinal (lower) : N/A  Constitutional : Negative for symptoms  Skin: Negative for skin symptoms  Eyes: Negative for eye symptoms  Ear/Nose/Throat : Negative for Ear/Nose/Throat symptoms  Hematologic/Lymphatic: Negative for Hematologic/Lymphatic symptoms  Cardiovascular : Chest pain  Respiratory : Negative for respiratory symptoms  Endocrine: Negative for endocrine symptoms  Musculoskeletal: Negative for musculoskeletal symptoms  Neurological: Dizziness  Psychologic: Negative for psychiatric symptoms

## 2020-01-06 NOTE — Addendum Note (Signed)
Addended by: Franchot Gallo on: 01/06/2020 03:48 PM   Modules accepted: Orders

## 2020-01-07 ENCOUNTER — Other Ambulatory Visit: Payer: Self-pay | Admitting: Urology

## 2020-01-07 DIAGNOSIS — C61 Malignant neoplasm of prostate: Secondary | ICD-10-CM

## 2020-01-07 DIAGNOSIS — E1140 Type 2 diabetes mellitus with diabetic neuropathy, unspecified: Secondary | ICD-10-CM | POA: Diagnosis not present

## 2020-01-07 DIAGNOSIS — M79671 Pain in right foot: Secondary | ICD-10-CM | POA: Diagnosis not present

## 2020-01-07 DIAGNOSIS — L03031 Cellulitis of right toe: Secondary | ICD-10-CM | POA: Diagnosis not present

## 2020-01-07 DIAGNOSIS — L03032 Cellulitis of left toe: Secondary | ICD-10-CM | POA: Diagnosis not present

## 2020-01-07 DIAGNOSIS — L600 Ingrowing nail: Secondary | ICD-10-CM | POA: Diagnosis not present

## 2020-01-08 ENCOUNTER — Telehealth: Payer: Self-pay | Admitting: *Deleted

## 2020-01-08 NOTE — Telephone Encounter (Signed)
Primary Cardiologist:Gregg Lovena Le, MD  Chart reviewed as part of pre-operative protocol coverage. Because of Peter Dunlap's past medical history and time since last visit, he/she will require a follow-up visit in order to better assess preoperative cardiovascular risk.  Pre-op covering staff: - Please schedule appointment and call patient to inform them. - Please contact requesting surgeon's office via preferred method (i.e, phone, fax) to inform them of need for appointment prior to surgery.  If applicable, this message will also be routed to pharmacy pool and/or primary cardiologist for input on holding anticoagulant/antiplatelet agent as requested below so that this information is available at time of patient's appointment.   Deberah Pelton, NP  01/08/2020, 10:15 AM

## 2020-01-08 NOTE — Telephone Encounter (Signed)
   [facility] Pre-operative Risk Assessment    HEARTCARE STAFF: - Please ensure there is not already an duplicate clearance open for this procedure. - Under Visit Info/Reason for Call, type in Other and utilize the format Clearance MM/DD/YY or Clearance TBD. Do not use dashes or single digits. - If request is for dental extraction, please clarify the # of teeth to be extracted.  Request for surgical clearance:  1. What type of surgery is being performed?  PROSTATE BIOPSY   2. When is this surgery scheduled?  01/27/20   3. What type of clearance is required (medical clearance vs. Pharmacy clearance to hold med vs. Both)?  BOTH  4. Are there any medications that need to be held prior to surgery and how long? ASPIRIN X'S 7 DAYS   5. Practice name and name of physician performing surgery?  [facility] / DR. DAHLSTEDT   6. What is the office phone number?  [phone]   7.   What is the office fax number?  [phone]  8.   Anesthesia type (None, local, MAC, general) ? NONE    Jeanann Lewandowsky 01/08/2020, 9:49 AM  _________________________________________________________________   (provider comments below)

## 2020-01-08 NOTE — Telephone Encounter (Signed)
Pt has been scheduled to see Tommye Standard, NP, 01/15/20.  Clearance will be addressee at that time. Will route back to the requesting surgeon's office to make them aware.

## 2020-01-10 ENCOUNTER — Other Ambulatory Visit: Payer: Self-pay | Admitting: Family Medicine

## 2020-01-13 NOTE — Progress Notes (Signed)
Cardiology Office Note Date:  01/15/2020  Patient ID:  Peter Dunlap, Peter Dunlap [birth year], MRN [id number] PCP:  [name], MD  Cardiologist:  Dr. Lovena Le   Chief Complaint:  pre-op clearance  History of Present Illness: Peter Dunlap is a 64 y.o. male with history of CAD (10/2010:50% mid LAD, diffuse distal disease, circumflex irregularities, large dominant RCA with a 50% ostial, 70% distal, 60% posterolateral and 70% PDA >>> 2015 DES to LAD), PVD (righ CEA 2010), HTN, HLD, OSA w/CPAP, SVT (ablation 2005), Afib s/p Watchman 2017 given h/o CNS bleed, tachy brady w/PPM, hypothyroidism (s/p RAI).  He comes in today to be seen for Dr. Lovena Le, last seen by him Dec 2020.  He was feeling well with rare palpitations.  Maintaining SR on Sotalol, no changes were made.  He is pending a prostate biopsy, needs cardiac clearance and to hold ASA 5 days.  TODAY He is  doing well.  Unfortunately they are evaluating for perhaps recurrent cancer. He denies any cardiac concerns.  No CP, palpitations or cardiac awareness.  No SOB or DOE, denies any difficulties with his ADLs.  He does not exercise, but stays busy. No dizziness, near syncope or syncope. Sees his PMD routinely and his labs are done there   RCRI score: 2 (for CAD and cerebrovascular disease), 6.6% DUKE: 8.97METS   AAD hx Sotalol started 2017, up titrated to current dose Dec 2017, remains current  Device information SJM dual chamber PPM, implanted 09/18/2014   Past Medical History:  Diagnosis Date  . Arteriosclerotic cardiovascular disease (ASCVD) 2005   catheterization in 10/2010:50% mid LAD, diffuse distal disease, circumflex irregularities, large dominant RCA with a 50% ostial, 70% distal, 60% posterolateral and 70% PDA; normal EF  . Arthritis   . Benign prostatic hypertrophy   . Bilateral carpal tunnel syndrome 07/03/2018  . Cerebrovascular disease 2010   R. carotid endarterectomy; Duplex in 10/2010-widely patent ICAs,  subtotal left vertebral-not thought to be contributing to symptoms  . Cervical spine disease    CT in 2012-advanced degeneration and spondylosis with moderate spinal stenosis at C3-C6  . Depression   . Erectile dysfunction   . Gastroesophageal reflux disease   . H/O hiatal hernia   . H/O: substance abuse ([place])    Cocaine, marijuana, alcohol.  Quit 2013.   [name] Hyperlipidemia   . Hypertension   . Non-ST elevation myocardial infarction (NSTEMI), initial episode of care [facility]) 12/02/2013   DES LAD  . Obesity   . Prostate cancer ([place])   . Sleep apnea    CPAP  . Tachy-brady syndrome ([place])    a. s/p STJ dual chamber PPM   . Thyroid disease   . Tobacco abuse    Quit 2014  . 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 BRAIN SURGERY  2015   hematoma evacuation  . BURR HOLE Right 04/13/2014   Procedure: Haskell Flirt;  Surgeon: Charlie Pitter, MD;  Location: [facility] ORS;  Service: Neurosurgery;  Laterality: Right;  . CAROTID ENDARTERECTOMY Right Feb. 25, 2010    CEA  . CORONARY ANGIOPLASTY WITH STENT PLACEMENT  12/03/2013   LAD 90%-->0% W/ Promus Premier DES 3.0 mm x 16 mm, CFX OK, RCA 40%, EF 70-75%  . LEFT ATRIAL APPENDAGE OCCLUSION N/A 08/05/2015   Procedure: LEFT ATRIAL APPENDAGE OCCLUSION;  Surgeon: [name], MD;  Location: [facility] CV LAB;  Service: Cardiovascular;  Laterality: N/A;  .  LEFT HEART CATHETERIZATION WITH CORONARY ANGIOGRAM Left 12/03/2013   Procedure: LEFT HEART CATHETERIZATION WITH CORONARY ANGIOGRAM;  Surgeon: Marykay Lex, MD;  Location: [facility] CATH LAB;  Service: Cardiovascular;  Laterality: Left;  . LEFT HEART CATHETERIZATION WITH CORONARY ANGIOGRAM N/A 01/26/2014   Procedure: LEFT HEART CATHETERIZATION WITH CORONARY ANGIOGRAM;  Surgeon: Corky Crafts, MD;  Location: [facility] CATH LAB;  Service: Cardiovascular;  Laterality: N/A;  . LEFT HEART CATHETERIZATION WITH CORONARY ANGIOGRAM N/A 08/03/2014   Procedure:  LEFT HEART CATHETERIZATION WITH CORONARY ANGIOGRAM;  Surgeon: Kathleene Hazel, MD;  Location: [facility] CATH LAB;  Service: Cardiovascular;  Laterality: N/A;  . PERCUTANEOUS CORONARY STENT INTERVENTION (PCI-S)  12/03/2013   Procedure: PERCUTANEOUS CORONARY STENT INTERVENTION (PCI-S);  Surgeon: Marykay Lex, MD;  Location: [facility] CATH LAB;  Service: Cardiovascular;;  . PERMANENT PACEMAKER INSERTION N/A 09/18/2014   Procedure: PERMANENT PACEMAKER INSERTION;  Surgeon: Marinus Maw, MD;  Location: [facility] CATH LAB;  Service: Cardiovascular;  Laterality: N/A;  . RADIOFREQUENCY ABLATION  2005   for PSVT  . TEE WITHOUT CARDIOVERSION N/A 07/27/2015   Procedure: TRANSESOPHAGEAL ECHOCARDIOGRAM (TEE);  Surgeon: Lewayne Bunting, MD;  Location: [facility] ENDOSCOPY;  Service: Cardiovascular;  Laterality: N/A;  . TEE WITHOUT CARDIOVERSION N/A 09/15/2015   Procedure: TRANSESOPHAGEAL ECHOCARDIOGRAM (TEE);  Surgeon: Vesta Mixer, MD;  Location: [facility] ENDOSCOPY;  Service: Cardiovascular;  Laterality: N/A;    Current Outpatient Medications  Medication Sig Dispense Refill  . allopurinol (ZYLOPRIM) 100 MG tablet TAKE 1 TABLET(100 MG) BY MOUTH DAILY 90 tablet 0  . aspirin EC 325 MG tablet Take 1 tablet (325 mg total) daily by mouth. 30 tablet 0  . Blood Glucose Monitoring Suppl (BLOOD GLUCOSE SYSTEM PAK) KIT Use as directed to monitor FSBS 1x daily. Dx: E11.9. 1 kit 1  . DEXILANT 60 MG capsule TAKE 1 CAPSULE(60 MG) BY MOUTH DAILY 30 capsule 5  . furosemide (LASIX) 20 MG tablet TAKE 3 TABLETS BY MOUTH DAILY 270 tablet 3  . glipiZIDE (GLUCOTROL XL) 2.5 MG 24 hr tablet TAKE 1 TABLET(2.5 MG) BY MOUTH DAILY WITH BREAKFAST 90 tablet 0  . isosorbide mononitrate (IMDUR) 60 MG 24 hr tablet TAKE 1 TABLET BY MOUTH DAILY 90 tablet 3  . levofloxacin (LEVAQUIN) 750 MG tablet Take 1 tablet (750 mg total) by mouth daily. 1 tablet 0  . levothyroxine (SYNTHROID) 200 MCG tablet TAKE 1 TABLET(200 MCG) BY MOUTH DAILY BEFORE BREAKFAST 90 tablet 0  .  loratadine (CLARITIN) 10 MG tablet Take 1 tablet (10 mg total) by mouth daily as needed for allergies. 30 tablet 2  . Magnesium Oxide 400 (240 Mg) MG TABS TAKE 1 TABLET BY MOUTH TWICE DAILY 60 tablet 8  . metFORMIN (GLUCOPHAGE) 500 MG tablet TAKE 1 TABLET BY MOUTH TWICE DAILY 180 tablet 0  . nitroGLYCERIN (NITROSTAT) 0.4 MG SL tablet PLACE 1 TABLET UNDER THE TONGUE EVERY 5 MINUTES AS NEEDED FOR CHEST PAIN. CALL 911 AT THIRD DOSE IN 15 MINUTES 25 tablet 1  . ONETOUCH ULTRA test strip USE TO TEST ONCE DAILY    . oxyCODONE-acetaminophen (PERCOCET) 10-325 MG tablet Take 1 tablet by mouth every 8 (eight) hours as needed for pain. 90 tablet 0  . potassium chloride SA (KLOR-CON) 20 MEQ tablet Take 1 tablet (20 mEq total) by mouth daily. 90 tablet 3  . simvastatin (ZOCOR) 40 MG tablet TAKE 1 TABLET(40 MG) BY MOUTH DAILY 90 tablet 1  . sotalol (BETAPACE) 160 MG tablet TAKE 1 TABLET(160 MG) BY MOUTH TWICE DAILY  180 tablet 3  . tamsulosin (FLOMAX) 0.4 MG CAPS capsule Take 1 capsule (0.4 mg total) by mouth in the morning and at bedtime. 60 capsule 0  . Vitamin D, Ergocalciferol, (DRISDOL) 1.25 MG (50000 UNIT) CAPS capsule Take 1 capsule (50,000 Units total) by mouth every 7 (seven) days. 12 capsule 0   No current facility-administered medications for this visit.    Allergies:   Trazodone and nefazodone and Lactose intolerance (gi)   Social History:  The patient  reports that he quit smoking about 7 years ago. His smoking use included cigarettes. He started smoking about 47 years ago. He has a 40.00 pack-year smoking history. He has never used smokeless tobacco. He reports that he does not drink alcohol and does not use drugs.   Family History:  The patient's family history includes Cancer in his maternal grandmother, maternal uncle, and sister; Coronary artery disease (age of onset: 49) in his father; Diabetes in his brother, mother, and sister; Diabetes type II in his father; Heart attack in his father;  Heart attack (age of onset: 30) in his sister; Hypertension in his brother, father, mother, and sister; Lung cancer in his paternal uncle.  ROS:  Please see the history of present illness. All other systems are reviewed and otherwise negative.   PHYSICAL EXAM:  VS:  BP 116/70   Pulse 65   Ht 6' (1.829 m)   Wt (!) 315 lb (142.9 kg)   SpO2 96%   BMI 42.72 kg/m  BMI: Body mass index is 42.72 kg/m. Well nourished, well developed, in no acute distress  HEENT: normocephalic, atraumatic  Neck: no JVD, carotid bruits or masses Cardiac:  RRR; no significant murmurs, no rubs, or gallops Lungs:  CTA b/l, no wheezing, rhonchi or rales  Abd: soft, nontender, obese MS: no deformity or atrophy Ext: no edema  Skin: warm and dry, no rash Neuro:  No gross deficits appreciated Psych: euthymic mood, full affect  PPM site is stable, no tethering or discomfort   EKG:  Done today and reviewed by myself shows  SR 65bpm, QT manually measured 500-514m, QTc 520-5435m PPM interrogation done today and reviewed by myself Battery and lead measurements are good AP 3.4% VP <1% No arrhythmias   09/15/2015: TTE Study Conclusions  - Left ventricle: The cavity size was normal. Wall thickness was  normal. Systolic function was normal.  - Mitral valve: No evidence of vegetation. There was mild  regurgitation.  - Atrial septum: No defect or patent foramen ovale was identified.  Echo contrast study showed no right-to-left atrial level shunt,  following an increase in RA pressure induced by provocative  maneuvers.    Recent Labs: 05/19/2019: ALT 17 08/25/2019: Hemoglobin 15.0; Platelets 129 12/24/2019: TSH 12.05 01/15/2020: BUN 11; Creatinine, Ser 1.22; Magnesium 1.9; Potassium 4.1; Sodium 137  05/16/2019: Cholesterol 124; HDL 35; LDL Cholesterol (Calc) 69; Total CHOL/HDL Ratio 3.5; Triglycerides 122   Estimated Creatinine Clearance: 89.7 mL/min (by C-G formula based on SCr of 1.22 mg/dL).    Wt Readings from Last 3 Encounters:  01/15/20 (!) 315 lb (142.9 kg)  01/06/20 (!) 317 lb (143.8 kg)  01/01/20 (!) 319 lb (144.7 kg)     Other studies reviewed: Additional studies/records reviewed today include: summarized above  ASSESSMENT AND PLAN:  1. PPM     Intact function, no programming changes made  2. CAD     No anginal symptoms     On ASA, BB, statin, nitrate  3. PVD  H/o R CEA     Re reports seeing vascular specialist annually  4. Paroxysmal Afib     S/p watchman      0% burden     On sotalol since 2017     QTc is a bit long, though similar to his prior EKGs on drug  Levaquin is on his list, though this was a single dose on 01/06/2020, I do not appreciate any other potential QT prolonging drugs  Given his EKG appears similar to others and has been chronically on drug, I will ask Dr. Lovena Le to review his EKG Continue for now without changes  BMET and mag today       5. HTN     Looks good  6. HLD     Follows labs with his PMD  7. Pre-op     Prostate surgegry, low risk procedure     No objection to the procedure planned, OK to hold ASA with no recent vascular procedures, resume once cleared by urologist post procedure    Disposition: F/u with Q [place], in clinic in [id number] if needed  Current medicines are reviewed at length with the patient today.  The patient did not have any concerns regarding medicines.  SiVenetia NightPA-C 01/15/2020 4:48 PM     [place] [phone])  [phone])

## 2020-01-15 ENCOUNTER — Other Ambulatory Visit: Payer: Self-pay

## 2020-01-15 ENCOUNTER — Ambulatory Visit (INDEPENDENT_AMBULATORY_CARE_PROVIDER_SITE_OTHER): Payer: Medicare Other | Admitting: Physician Assistant

## 2020-01-15 VITALS — BP 116/70 | HR 65 | Ht 72.0 in | Wt 315.0 lb

## 2020-01-15 DIAGNOSIS — Z950 Presence of cardiac pacemaker: Secondary | ICD-10-CM | POA: Diagnosis not present

## 2020-01-15 DIAGNOSIS — I480 Paroxysmal atrial fibrillation: Secondary | ICD-10-CM | POA: Diagnosis not present

## 2020-01-15 DIAGNOSIS — Z01818 Encounter for other preprocedural examination: Secondary | ICD-10-CM | POA: Diagnosis not present

## 2020-01-15 DIAGNOSIS — I5032 Chronic diastolic (congestive) heart failure: Secondary | ICD-10-CM

## 2020-01-15 DIAGNOSIS — I2510 Atherosclerotic heart disease of native coronary artery without angina pectoris: Secondary | ICD-10-CM

## 2020-01-15 DIAGNOSIS — I739 Peripheral vascular disease, unspecified: Secondary | ICD-10-CM

## 2020-01-15 DIAGNOSIS — Z79899 Other long term (current) drug therapy: Secondary | ICD-10-CM

## 2020-01-15 LAB — MAGNESIUM: Magnesium: 1.9 mg/dL (ref 1.6–2.3)

## 2020-01-15 LAB — BASIC METABOLIC PANEL
BUN/Creatinine Ratio: 9 — ABNORMAL LOW (ref 10–24)
BUN: 11 mg/dL (ref 8–27)
CO2: 26 mmol/L (ref 20–29)
Calcium: 9.4 mg/dL (ref 8.6–10.2)
Chloride: 98 mmol/L (ref 96–106)
Creatinine, Ser: 1.22 mg/dL (ref 0.76–1.27)
GFR calc Af Amer: 72 mL/min/{1.73_m2} (ref >59)
GFR calc non Af Amer: 62 mL/min/{1.73_m2} (ref >59)
Glucose: 147 mg/dL — ABNORMAL HIGH (ref 65–99)
Potassium: 4.1 mmol/L (ref 3.5–5.2)
Sodium: 137 mmol/L (ref 134–144)

## 2020-01-15 NOTE — Patient Instructions (Signed)
Medication Instructions:  Your physician recommends that you continue on your current medications as directed. Please refer to the Current Medication list given to you today.  *If you need a refill on your cardiac medications before your next appointment, please call your pharmacy*   Lab Work: BMET  [place]   If you have labs (blood work) drawn today and your tests are completely normal, you will receive your results only by: [name] MyChart Message (if you have MyChart) OR . A paper copy in the mail If you have any lab test that is abnormal or we need to change your treatment, we will call you to review the results.   Testing/Procedures: NONE ORDERED  TODAY   Follow-Up: At [facility], you and your health needs are our priority.  As part of our continuing mission to provide you with exceptional heart care, we have created designated Provider Care Teams.  These Care Teams include your primary Cardiologist (physician) and Advanced Practice Providers (APPs -  Physician Assistants and Nurse Practitioners) who all work together to provide you with the care you need, when you need it.  We recommend signing up for the patient portal called "MyChart".  Sign up information is provided on this After Visit Summary.  MyChart is used to connect with patients for Virtual Visits (Telemedicine).  Patients are able to view lab/test results, encounter notes, upcoming appointments, etc.  Non-urgent messages can be sent to your provider as well.   To learn more about what you can do with MyChart, go to [email or web address].    Your next appointment:   6 month(s)  The format for your next appointment:   In Person  Provider:   You may see Dr. Lovena Le  or one of the following Advanced Practice Providers on your designated Care Team:    Chanetta Marshall, NP  Tommye Standard, PA-C  Legrand Como "Oda Kilts, [place]    Other Instructions

## 2020-01-27 ENCOUNTER — Other Ambulatory Visit: Payer: Self-pay | Admitting: *Deleted

## 2020-01-27 ENCOUNTER — Ambulatory Visit (HOSPITAL_COMMUNITY)
Admission: RE | Admit: 2020-01-27 | Discharge: 2020-01-27 | Disposition: A | Discharge status: Home / Self Care | Payer: Medicare Other | Source: Ambulatory Visit | Attending: Urology | Admitting: Urology

## 2020-01-27 ENCOUNTER — Other Ambulatory Visit: Payer: Self-pay | Admitting: Urology

## 2020-01-27 ENCOUNTER — Ambulatory Visit: Payer: Medicare Other | Admitting: Urology

## 2020-01-27 ENCOUNTER — Other Ambulatory Visit: Payer: Self-pay

## 2020-01-27 DIAGNOSIS — Z8546 Personal history of malignant neoplasm of prostate: Secondary | ICD-10-CM | POA: Diagnosis present

## 2020-01-27 DIAGNOSIS — R9721 Rising PSA following treatment for malignant neoplasm of prostate: Secondary | ICD-10-CM | POA: Diagnosis not present

## 2020-01-27 DIAGNOSIS — C61 Malignant neoplasm of prostate: Secondary | ICD-10-CM

## 2020-01-27 MED ORDER — LIDOCAINE HCL (PF) 2 % IJ SOLN
INTRAMUSCULAR | Status: AC
  Filled 2020-01-27: qty 10

## 2020-01-27 MED ORDER — GENTAMICIN SULFATE 40 MG/ML IJ SOLN
INTRAMUSCULAR | Status: AC
  Administered 2020-01-27: 160 mg via INTRAMUSCULAR
  Filled 2020-01-27: qty 4

## 2020-01-27 MED ORDER — OXYCODONE-ACETAMINOPHEN 10-325 MG PO TABS
1.0000 | ORAL_TABLET | Freq: Three times a day (TID) | ORAL | 0 refills | Status: DC | PRN
Start: 2020-01-27 — End: 2020-02-27

## 2020-01-27 MED ORDER — GENTAMICIN SULFATE 40 MG/ML IJ SOLN: 160.0000 mg | Freq: Once | INTRAMUSCULAR | Status: AC

## 2020-01-27 NOTE — Telephone Encounter (Signed)
Received call from patient.   Requested refill on Oxycodone/APAP.   Ok to refill??  Last office visit 08/25/2019.  Last refill 12/29/2019.

## 2020-01-27 NOTE — Procedures (Signed)
Risks, benefits, and some of the potential complications of a transrectal ultrasounds of the prostate (TRUSP) with biopsies were discussed at length with the patient including gross hematuria, blood in the bowel movements, hematospermia, bacteremia, infection, voiding discomfort, urinary retention, fever, chills, sepsis, blood transfusion, death, and others. All questions were answered. Informed consent was obtained. The patient confirmed that he had taken his pre-procedure antibiotic. All anticoagulants were discontinued prior to the procedure. The patient emptied his bladder. He was positioned in a comfortable left lateral decubitus position with hips and knees acutely flexed.  The rectal probe was inserted into the rectum without difficulty. 10cc of 2% Lidocaine without epinephrine was instilled with a spinal needle using ultrasound guidance near the junction of each seminal vesicle and the prostate.  Sequential transverse (axial) scans were made in small increments beginning at the seminal vesicles and ending at the prostatic apex. Sequential longitudinal (saggital) scans were made in small increments beginning at the right lateral prostate and ending at the left lateral prostate. Excellent anatomical imaging was obtained. The peripheral, transitional, and central zones were well-defined. The seminal vesicles were normal.~~  Prostate volume 32 ml.  There were no hypoechoic areas. 14 needle core biopsies were performed. 1 biopsy each was taken from the following areas:  Right lateral base, right medial base, right lateral mid prostate, right medial mid prostate, right lateral apical prostate, right medial apical prostate, left lateral base, left medial base, left lateral mid prostate, left medial mid prostate, left lateral apical prostate, left medial apical prostate. Additionally, 1 needle core was taken from each SV.. Minimal prostatic calcifications were noted. Excellent biopsy specimens were  obtained.  Follow-up rectal examination was unremarkable. The procedure was well-tolerated and without complications. Antibiotic instructions were given. The patient was told that:  For several days:  he should increase his fluid intake and limit strenuous activity  he might have mild discomfort at the base of his penis or in his rectum  he might have blood in his urine or blood in his bowel movements  For 2-3 months:  he might have blood in his ejaculate (semen)  Instructions were given to call the office immedicately for blood clots in the urine or bowel movements, difficulty urinating, inability to urinate, urinary retention, painful or frequent urination, fever, chills, nausea, vomiting, or other illness. The patient stated that he understood these instructions and would comply with them. We told the patient that prostate biopsy pathology reports are usually available within 3-5 working days, unless a pathologic second opinion is required, which may take 7-14 days. We told him to contact us to check on the status of his biopsy if he has not heard from [place] within 7 days. The patient left the ultrasound examination room in stable condition.

## 2020-02-05 ENCOUNTER — Telehealth: Payer: Self-pay | Admitting: Family Medicine

## 2020-02-05 NOTE — Progress Notes (Signed)
  Chronic Care Management   Note  02/05/2020 Name: Peter Dunlap MRN: [id number] DOB: [birth year]  Peter Dunlap is a 64 y.o. year old male who is a primary care patient of [place], [name], MD. I reached out to Peter Dunlap by phone today in response to a referral sent by Mr. Domingo Madeira PCP, [name], MD.   Peter Dunlap was given information about Chronic Care Management services today including:  1. CCM service includes personalized support from designated clinical staff supervised by his physician, including individualized plan of care and coordination with other care providers 2. 24/7 contact phone numbers for assistance for urgent and routine care needs. 3. Service will only be billed when office clinical staff spend 20 minutes or more in a month to coordinate care. 4. Only one practitioner may furnish and bill the service in a calendar month. 5. The patient may stop CCM services at any time (effective at the end of the month) by phone call to the office staff.   Patient agreed to services and verbal consent obtained.   Follow up plan:   Carley Perdue [employer] Scheduler

## 2020-02-10 ENCOUNTER — Ambulatory Visit (INDEPENDENT_AMBULATORY_CARE_PROVIDER_SITE_OTHER): Payer: Medicare Other | Admitting: Urology

## 2020-02-10 ENCOUNTER — Other Ambulatory Visit: Payer: Self-pay

## 2020-02-10 ENCOUNTER — Encounter: Payer: Self-pay | Admitting: Urology

## 2020-02-10 VITALS — BP 104/57 | HR 65 | Temp 98.9°F | Ht 72.0 in | Wt 310.0 lb

## 2020-02-10 DIAGNOSIS — N401 Enlarged prostate with lower urinary tract symptoms: Secondary | ICD-10-CM | POA: Diagnosis not present

## 2020-02-10 DIAGNOSIS — C61 Malignant neoplasm of prostate: Secondary | ICD-10-CM

## 2020-02-10 DIAGNOSIS — R350 Frequency of micturition: Secondary | ICD-10-CM

## 2020-02-10 MED ORDER — SILODOSIN 8 MG PO CAPS: 8.0000 mg | ORAL_CAPSULE | Freq: Two times a day (BID) | ORAL | 11 refills | Status: DC

## 2020-02-10 NOTE — Progress Notes (Signed)
H&P  Chief Complaint: PCa  History of Present Illness:   9.7.2021: He is here to discuss his biopsy results.  1/12 cores-from the left mid lateral region-came back positive for adenocarcinoma.  Grading was difficult due to his prior radiation.  He has done fairly well since his biopsy.  He does state that he needs a refill of his alpha-blocker.  (below copied from [place] records):  Hematuria: He first noticed the symptoms approximately 10/27/2016.   Prior to his 5.29.2018 appointment he began having dysuria and gross hematuria, initially in his stream. It was felt that he had a urinary tract infection. Urine culture was negative, but he did clear with an antibiotic.   9.15.2020: He has only had one instance of hematuria since last visit, which occurred last month. He states that he had a single clot per his urine that had a little bit of blood afterwards,  Prostate cancer: He underwent TRUS/Bx on 8.15.2017. At that time, PSA was 9.09. Prostatic volume was 25 cc. 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   9.15.2020: PSA 0.8.  BPH: Patient is currently treated with Rapaflo for his symptoms.   4.16.2019: He still has moderate voiding symptomatology despite being on Rapaflo. No gross hematuria or dysuria, however.  IPSS 25  Rapaflo increased to BID.   10.22.2019: Current IPSS 27 QOl score 2--only on 1 Rapaflo a day   1.27.2020: The patient is still having significant lower urinary tract symptoms. Rapaflo increased to Q 12 hrs   5.12.2020: IPSS is 21. Quality of life score 2. He is on generic Rapaflo twice a day.   9.15.2020: He reports that his only major urinary issue is that his stream is somewhat slow to start. He also notes that he has occasional dysuria but this is not too  bothersome for him. He continues on tamsulosin 2x a day. He has variable nocturia depending on his evening fluid intake.  8.3.2021: Pt reports recent/persistent gross hematuria - beginning of stream but notes that his FOS is strong. Pt reports no other significant concerns at this time.  PSA checked in June at the time of his visit was up to 1.4, representing at least a second or third PSA increase.  Past Medical History:  Diagnosis Date  . Arteriosclerotic cardiovascular disease (ASCVD) 2005   catheterization in 10/2010:50% mid LAD, diffuse distal disease, circumflex irregularities, large dominant RCA with a 50% ostial, 70% distal, 60% posterolateral and 70% PDA; normal EF  . Arthritis   . Benign prostatic hypertrophy   . Bilateral carpal tunnel syndrome 07/03/2018  . Cerebrovascular disease 2010   R. carotid endarterectomy; Duplex in 10/2010-widely patent ICAs, subtotal left vertebral-not thought to be contributing to symptoms  . Cervical spine disease    CT in 2012-advanced degeneration and spondylosis with moderate spinal stenosis at C3-C6  . Depression   . Erectile dysfunction   . Gastroesophageal reflux disease   . H/O hiatal hernia   . H/O: substance abuse ([place])    Cocaine, marijuana, alcohol.  Quit 2013.   [name] Hyperlipidemia   . Hypertension   . Non-ST elevation myocardial infarction (NSTEMI), initial episode of care [facility]) 12/02/2013   DES LAD  . Obesity   . Prostate cancer ([place])   . Sleep apnea  CPAP  . Tachy-brady syndrome (HCC)    a. s/p STJ dual chamber PPM   . Thyroid disease   . Tobacco abuse    Quit 2014  . 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 BRAIN SURGERY  2015   hematoma evacuation  . BURR HOLE Right 04/13/2014   Procedure: Ezekiel Ina;  Surgeon: Temple Pacini, MD;  Location: [facility] NEURO ORS;  Service: Neurosurgery;  Laterality: Right;  . CAROTID ENDARTERECTOMY Right Feb. 25, 2010    CEA  .  CORONARY ANGIOPLASTY WITH STENT PLACEMENT  12/03/2013   LAD 90%-->0% W/ Promus Premier DES 3.0 mm x 16 mm, CFX OK, RCA 40%, EF 70-75%  . LEFT ATRIAL APPENDAGE OCCLUSION N/A 08/05/2015   Procedure: LEFT ATRIAL APPENDAGE OCCLUSION;  Surgeon: [name], MD;  Location: [facility] INVASIVE CV LAB;  Service: Cardiovascular;  Laterality: N/A;  . LEFT HEART CATHETERIZATION WITH CORONARY ANGIOGRAM Left 12/03/2013   Procedure: LEFT HEART CATHETERIZATION WITH CORONARY ANGIOGRAM;  Surgeon: Marykay Lex, MD;  Location: [facility] CATH LAB;  Service: Cardiovascular;  Laterality: Left;  . LEFT HEART CATHETERIZATION WITH CORONARY ANGIOGRAM N/A 01/26/2014   Procedure: LEFT HEART CATHETERIZATION WITH CORONARY ANGIOGRAM;  Surgeon: Corky Crafts, MD;  Location: [facility] CATH LAB;  Service: Cardiovascular;  Laterality: N/A;  . LEFT HEART CATHETERIZATION WITH CORONARY ANGIOGRAM N/A 08/03/2014   Procedure: LEFT HEART CATHETERIZATION WITH CORONARY ANGIOGRAM;  Surgeon: Kathleene Hazel, MD;  Location[facility] CATH LAB;  Service: Cardiovascular;  Laterality: N/A;  . PERCUTANEOUS CORONARY STENT INTERVENTION (PCI-S)  12/03/2013   Procedure: PERCUTANEOUS CORONARY STENT INTERVENTION (PCI-S);  Surgeon: Marykay Lex, MD;  Location: [facility] CATH LAB;  Service: Cardiovascular;;  . PERMANENT PACEMAKER INSERTION N/A 09/18/2014   Procedure: PERMANENT PACEMAKER INSERTION;  Surgeon: Marinus Maw, MD;  Location: [facility] CATH LAB;  Service: Cardiovascular;  Laterality: N/A;  . RADIOFREQUENCY ABLATION  2005   for PSVT  . TEE WITHOUT CARDIOVERSION N/A 07/27/2015   Procedure: TRANSESOPHAGEAL ECHOCARDIOGRAM (TEE);  Surgeon: Lewayne Bunting, MD;  Location[facility] ENDOSCOPY;  Service: Cardiovascular;  Laterality: N/A;  . TEE WITHOUT CARDIOVERSION N/A 09/15/2015   Procedure: TRANSESOPHAGEAL ECHOCARDIOGRAM (TEE);  Surgeon: Vesta Mixer, MD;  Location: [facility] ENDOSCOPY;  Service: Cardiovascular;  Laterality: N/A;    Home Medications:  Allergies as of 02/10/2020       Reactions   Trazodone And Nefazodone    Nightmares   Lactose Intolerance (gi) Other (See Comments)   UPSET STOMACH      Medication List       Accurate as of February 10, 2020 12:05 PM. If you have any questions, ask your nurse or doctor.        allopurinol 100 MG tablet Commonly known as: ZYLOPRIM TAKE 1 TABLET(100 MG) BY MOUTH DAILY   aspirin EC 325 MG tablet Take 1 tablet (325 mg total) daily by mouth.   Blood Glucose System Pak Kit Use as directed to monitor FSBS 1x daily. Dx: E11.9.   Dexilant 60 MG capsule Generic drug: dexlansoprazole TAKE 1 CAPSULE(60 MG) BY MOUTH DAILY   furosemide 20 MG tablet Commonly known as: LASIX TAKE 3 TABLETS BY MOUTH DAILY   glipiZIDE 2.5 MG 24 hr tablet Commonly known as: GLUCOTROL XL TAKE 1 TABLET(2.5 MG) BY MOUTH DAILY WITH BREAKFAST   isosorbide mononitrate 60 MG 24 hr tablet Commonly known as: IMDUR TAKE 1 TABLET BY MOUTH DAILY   levofloxacin 750 MG  tablet Commonly known as: Levaquin Take 1 tablet (750 mg total) by mouth daily.   levothyroxine 200 MCG tablet Commonly known as: SYNTHROID TAKE 1 TABLET(200 MCG) BY MOUTH DAILY BEFORE BREAKFAST   loratadine 10 MG tablet Commonly known as: CLARITIN Take 1 tablet (10 mg total) by mouth daily as needed for allergies.   Magnesium Oxide 400 (240 Mg) MG Tabs TAKE 1 TABLET BY MOUTH TWICE DAILY   metFORMIN 500 MG tablet Commonly known as: GLUCOPHAGE TAKE 1 TABLET BY MOUTH TWICE DAILY   nitroGLYCERIN 0.4 MG SL tablet Commonly known as: NITROSTAT PLACE 1 TABLET UNDER THE TONGUE EVERY 5 MINUTES AS NEEDED FOR CHEST PAIN. CALL 911 AT THIRD DOSE IN 15 MINUTES   OneTouch Ultra test strip Generic drug: glucose blood USE TO TEST ONCE DAILY   oxyCODONE-acetaminophen 10-325 MG tablet Commonly known as: PERCOCET Take 1 tablet by mouth every 8 (eight) hours as needed for pain.   potassium chloride SA 20 MEQ tablet Commonly known as: KLOR-CON Take 1 tablet (20 mEq total) by mouth  daily.   simvastatin 40 MG tablet Commonly known as: ZOCOR TAKE 1 TABLET(40 MG) BY MOUTH DAILY   sotalol 160 MG tablet Commonly known as: BETAPACE TAKE 1 TABLET(160 MG) BY MOUTH TWICE DAILY   Vitamin D (Ergocalciferol) 1.25 MG (50000 UNIT) Caps capsule Commonly known as: DRISDOL Take 1 capsule (50,000 Units total) by mouth every 7 (seven) days.       Allergies:  Allergies  Allergen Reactions  . Trazodone And Nefazodone     Nightmares  . Lactose Intolerance (Gi) Other (See Comments)    UPSET STOMACH     Family History  Problem Relation Age of Onset  . Hypertension Mother        Cerebrovascular disease  . Diabetes Mother   . Coronary artery disease Father 63  . Diabetes type II Father   . Hypertension Father   . Heart attack Father   . Diabetes Brother   . Hypertension Brother   . Lung cancer Paternal Uncle   . Diabetes Sister   . Hypertension Sister   . Heart attack Sister 54  . Cancer Sister        leukemia  . Cancer Maternal Uncle        breast  . Cancer Maternal Grandmother        breast    Social History:  reports that he quit smoking about 7 years ago. His smoking use included cigarettes. He started smoking about 47 years ago. He has a 40.00 pack-year smoking history. He has never used smokeless tobacco. He reports that he does not drink alcohol and does not use drugs.  ROS: A complete review of systems was performed.  All systems are negative except for pertinent findings as noted.  Physical Exam:  Vital signs in last 24 hours: There were no vitals taken for this visit. Constitutional:  Alert and oriented, No acute distress Cardiovascular: Regular rate  Respiratory: Normal respiratory effort Lymphatic: No lymphadenopathy Neurologic: Grossly intact, no focal deficits Psychiatric: Normal mood and affect  I have reviewed prior pt notes  I have reviewed notes from referring/previous physicians  I have reviewed urinalysis results  I have  independently reviewed prior imaging  I have reviewed prior PSA results Impression/Assessment:  1.  Unfavorable intermediate risk prostate cancer, status post radiation with recurrent disease on recent biopsy  2.  BPH, symptoms are quite bothersome, not currently on his Rapaflo  Plan:  1.  I shared  biopsy results with him  2.  I will order CT abdomen and pelvis/bone scan, call with results  3.  If no extraprostatic disease consider cryoablation  4.  Rapaflo prescribed, twice a day

## 2020-02-11 ENCOUNTER — Ambulatory Visit (INDEPENDENT_AMBULATORY_CARE_PROVIDER_SITE_OTHER): Payer: Medicare Other | Admitting: *Deleted

## 2020-02-11 DIAGNOSIS — I495 Sick sinus syndrome: Secondary | ICD-10-CM

## 2020-02-11 LAB — CUP PACEART REMOTE DEVICE CHECK
Battery Remaining Longevity: 128 mo
Battery Remaining Percentage: 95.5 %
Battery Voltage: 2.98 V
Brady Statistic AP VP Percent: 1 %
Brady Statistic AP VS Percent: 6.5 %
Brady Statistic AS VP Percent: 1 %
Brady Statistic AS VS Percent: 93 %
Brady Statistic RA Percent Paced: 6.3 %
Brady Statistic RV Percent Paced: 1 %
Date Time Interrogation Session: 20210908020013
Implantable Lead Implant Date: 20160415
Implantable Lead Implant Date: 20160415
Implantable Lead Location: 753859
Implantable Lead Location: 753860
Implantable Pulse Generator Implant Date: 20160415
Lead Channel Impedance Value: 430 Ohm
Lead Channel Impedance Value: 590 Ohm
Lead Channel Pacing Threshold Amplitude: 0.5 V
Lead Channel Pacing Threshold Amplitude: 0.75 V
Lead Channel Pacing Threshold Pulse Width: 0.5 ms
Lead Channel Pacing Threshold Pulse Width: 0.5 ms
Lead Channel Sensing Intrinsic Amplitude: 12 mV
Lead Channel Sensing Intrinsic Amplitude: 4.5 mV
Lead Channel Setting Pacing Amplitude: 2 V
Lead Channel Setting Pacing Amplitude: 2.5 V
Lead Channel Setting Pacing Pulse Width: 0.5 ms
Lead Channel Setting Sensing Sensitivity: 2 mV
Pulse Gen Model: 2240
Pulse Gen Serial Number: 7756161

## 2020-02-12 ENCOUNTER — Other Ambulatory Visit: Payer: Self-pay | Admitting: Family Medicine

## 2020-02-12 NOTE — Progress Notes (Signed)
Remote pacemaker transmission.   

## 2020-02-18 DIAGNOSIS — M79675 Pain in left toe(s): Secondary | ICD-10-CM | POA: Diagnosis not present

## 2020-02-18 DIAGNOSIS — L600 Ingrowing nail: Secondary | ICD-10-CM | POA: Diagnosis not present

## 2020-02-18 DIAGNOSIS — M79672 Pain in left foot: Secondary | ICD-10-CM | POA: Diagnosis not present

## 2020-02-25 ENCOUNTER — Other Ambulatory Visit: Payer: Self-pay

## 2020-02-25 ENCOUNTER — Encounter (HOSPITAL_COMMUNITY)
Admission: RE | Admit: 2020-02-25 | Discharge: 2020-02-25 | Disposition: A | Discharge status: Home / Self Care | Payer: Medicare Other | Source: Ambulatory Visit | Attending: Urology | Admitting: Urology

## 2020-02-25 ENCOUNTER — Encounter (HOSPITAL_COMMUNITY): Payer: Self-pay

## 2020-02-25 DIAGNOSIS — C61 Malignant neoplasm of prostate: Secondary | ICD-10-CM | POA: Diagnosis not present

## 2020-02-25 DIAGNOSIS — M19012 Primary osteoarthritis, left shoulder: Secondary | ICD-10-CM | POA: Diagnosis not present

## 2020-02-25 DIAGNOSIS — M19071 Primary osteoarthritis, right ankle and foot: Secondary | ICD-10-CM | POA: Diagnosis not present

## 2020-02-25 DIAGNOSIS — M170 Bilateral primary osteoarthritis of knee: Secondary | ICD-10-CM | POA: Diagnosis not present

## 2020-02-25 DIAGNOSIS — M19011 Primary osteoarthritis, right shoulder: Secondary | ICD-10-CM | POA: Diagnosis not present

## 2020-02-25 HISTORY — DX: Type 2 diabetes mellitus without complications: E11.9

## 2020-02-25 MED ORDER — TECHNETIUM TC 99M MEDRONATE IV KIT
20.0000 | PACK | Freq: Once | INTRAVENOUS | Status: AC | PRN
  Administered 2020-02-25: 19.4 via INTRAVENOUS

## 2020-02-27 ENCOUNTER — Other Ambulatory Visit: Payer: Self-pay | Admitting: *Deleted

## 2020-02-27 MED ORDER — OXYCODONE-ACETAMINOPHEN 10-325 MG PO TABS: 1.0000 | ORAL_TABLET | Freq: Three times a day (TID) | ORAL | 0 refills | Status: DC | PRN

## 2020-02-27 NOTE — Telephone Encounter (Signed)
Received call from patient.   Requested refill on Oxycodone/APAP.  Ok to refill??  Last office visit 02/05/2020.  Last refill 01/27/2020.

## 2020-02-27 NOTE — Telephone Encounter (Signed)
Letter sent.

## 2020-02-27 NOTE — Telephone Encounter (Signed)
Pt needs OV 

## 2020-03-02 ENCOUNTER — Other Ambulatory Visit: Payer: Self-pay | Admitting: Family Medicine

## 2020-03-09 ENCOUNTER — Other Ambulatory Visit: Payer: Self-pay

## 2020-03-09 ENCOUNTER — Ambulatory Visit (HOSPITAL_COMMUNITY)
Admission: RE | Admit: 2020-03-09 | Discharge: 2020-03-09 | Disposition: A | Discharge status: Home / Self Care | Payer: Medicare Other | Source: Ambulatory Visit | Attending: Urology | Admitting: Urology

## 2020-03-09 DIAGNOSIS — C61 Malignant neoplasm of prostate: Secondary | ICD-10-CM | POA: Diagnosis present

## 2020-03-09 DIAGNOSIS — K760 Fatty (change of) liver, not elsewhere classified: Secondary | ICD-10-CM | POA: Diagnosis not present

## 2020-03-09 LAB — POCT I-STAT CREATININE: Creatinine, Ser: 1.1 mg/dL (ref 0.61–1.24)

## 2020-03-09 MED ORDER — IOHEXOL 300 MG/ML  SOLN
100.0000 mL | Freq: Once | INTRAMUSCULAR | Status: AC | PRN
  Administered 2020-03-09: 100 mL via INTRAVENOUS

## 2020-03-13 ENCOUNTER — Other Ambulatory Visit: Payer: Self-pay | Admitting: "Endocrinology

## 2020-03-13 DIAGNOSIS — E1169 Type 2 diabetes mellitus with other specified complication: Secondary | ICD-10-CM

## 2020-03-13 DIAGNOSIS — E890 Postprocedural hypothyroidism: Secondary | ICD-10-CM

## 2020-03-13 DIAGNOSIS — E669 Obesity, unspecified: Secondary | ICD-10-CM

## 2020-03-15 ENCOUNTER — Other Ambulatory Visit: Payer: Self-pay

## 2020-03-15 ENCOUNTER — Encounter: Payer: Self-pay | Admitting: Family Medicine

## 2020-03-15 ENCOUNTER — Ambulatory Visit (INDEPENDENT_AMBULATORY_CARE_PROVIDER_SITE_OTHER): Payer: Medicare Other | Admitting: Family Medicine

## 2020-03-15 VITALS — BP 110/68 | HR 66 | Temp 98.5°F | Resp 14 | Ht 72.0 in | Wt 316.0 lb

## 2020-03-15 DIAGNOSIS — E559 Vitamin D deficiency, unspecified: Secondary | ICD-10-CM

## 2020-03-15 DIAGNOSIS — Z23 Encounter for immunization: Secondary | ICD-10-CM

## 2020-03-15 DIAGNOSIS — I10 Essential (primary) hypertension: Secondary | ICD-10-CM | POA: Diagnosis not present

## 2020-03-15 DIAGNOSIS — M109 Gout, unspecified: Secondary | ICD-10-CM

## 2020-03-15 DIAGNOSIS — I2510 Atherosclerotic heart disease of native coronary artery without angina pectoris: Secondary | ICD-10-CM

## 2020-03-15 DIAGNOSIS — E1149 Type 2 diabetes mellitus with other diabetic neurological complication: Secondary | ICD-10-CM | POA: Diagnosis not present

## 2020-03-15 DIAGNOSIS — G894 Chronic pain syndrome: Secondary | ICD-10-CM

## 2020-03-15 DIAGNOSIS — C61 Malignant neoplasm of prostate: Secondary | ICD-10-CM | POA: Diagnosis not present

## 2020-03-15 DIAGNOSIS — E669 Obesity, unspecified: Secondary | ICD-10-CM

## 2020-03-15 NOTE — Assessment & Plan Note (Signed)
Continue allopurinol 

## 2020-03-15 NOTE — Patient Instructions (Signed)
F/U 4 months We will call with results FLu shot given

## 2020-03-15 NOTE — Assessment & Plan Note (Signed)
Continue oxycodone acetaminophen

## 2020-03-15 NOTE — Assessment & Plan Note (Signed)
Check reviewed last cardiology note.  He has been asymptomatic

## 2020-03-15 NOTE — Assessment & Plan Note (Signed)
Controlled. No changes. 

## 2020-03-15 NOTE — Assessment & Plan Note (Signed)
Goal A1c less than 7%.  We will recheck A1c today as well as renal function urine microalbumin he was scheduled with his eye doctor

## 2020-03-15 NOTE — Assessment & Plan Note (Signed)
We will follow-up with his urologist this week regarding the CT scan and next steps for treatment for his prostate cancer

## 2020-03-15 NOTE — Progress Notes (Signed)
Subjective:    Patient ID: Peter Dun[place], male    DOB: 1955/09/19, 64 y.o.   MRN: [id number]  Patient presents for Follow-up (is fasting)  Patient here to follow-up chronic medical problems.  Medications reviewed.  CAD- no chest pain , checks BP at home followed by cardiology   He has not been as active as gained  Weight down 3lbs since March   he is still on lasix with potasium , sotalol , simvastatin, Imdur  PAF/Tachy brady syndrome followed by Dr. Lovena Le had check in December no changes   DM type 2-A1C was  7.5% in December, when he does check his CBG once a week, states in the 120;s   metformin  Taking  500mg  twice a day, glipizidde 2.5mg  once a day    Hypothyroidism- taking synthroid 23mcg per endocrinology   GERD- he is taking Dexilant   BPH/ Prostate cancer followed by urology no current symptoms , HE had CT scan done that showed abnormal left inguinal node  Fingers lock up-middle finger, he has to pop or pry them up, he was seen by ortho, they did not recommend any surgical intervention   Followed by podiatry has an ointment for his feet   Due for flu shot   He is due for eyedoctor has recurrence of nodules on lower lids     Review Of Systems:  GEN- denies fatigue, fever, weight loss,weakness, recent illness HEENT- denies eye drainage, change in vision, nasal discharge, CVS- denies chest pain, palpitations RESP- denies SOB, cough, wheeze ABD- denies N/V, change in stools, abd pain GU- denies dysuria, hematuria, dribbling, incontinence MSK- denies joint pain, muscle aches, injury Neuro- denies headache, dizziness, syncope, seizure activity       Objective:    BP 110/68   Pulse 66   Temp 98.5 F (36.9 C) (Temporal)   Resp 14   Ht 6' (1.829 m)   Wt (!) 316 lb (143.3 kg)   SpO2 94%   BMI 42.86 kg/m  GEN- NAD, alert and oriented x3 HEENT- PERRL, EOMI, non injected sclera, pink conjunctiva, MMM, oropharynx clear, right lower leg small papular  lesion Neck- Supple, no thyromegaly CVS- RRR, no murmur RESP-CTAB ABD-NABS,soft,NT,ND EXT- ankle  Edema BILAT  Pulses- Radial, DP- 2+        Assessment & Plan:      Problem List Items Addressed This Visit      Unprioritized   CAD (coronary artery disease)    Check reviewed last cardiology note.  He has been asymptomatic      Relevant Orders   CBC with Differential/Platelet   Comprehensive metabolic panel   Lipid panel   Chronic pain syndrome    Continue oxycodone acetaminophen      Class 3 obesity   Essential hypertension - Primary    Controlled  No changes       Relevant Orders   CBC with Differential/Platelet   Comprehensive metabolic panel   Gout    Continue allopurinol      Prostate cancer ([place])    We will follow-up with his urologist this week regarding the CT scan and next steps for treatment for his prostate cancer      Type 2 diabetes mellitus with neurological complications ([place])    Goal A1c less than 7%.  We will recheck A1c today as well as renal function urine microalbumin he was scheduled with his eye doctor      Relevant Orders   Microalbumin / creatinine urine  ratio   Lipid panel   Hemoglobin A1c   Vitamin D deficiency   Relevant Orders   VITAMIN D 25 Hydroxy (Vit-D Deficiency, Fractures)    Other Visit Diagnoses    Need for immunization against influenza       Relevant Orders   Flu Vaccine QUAD 36+ mos IM (Completed)      Note: This dictation was prepared with Dragon dictation along with smaller phrase technology. Any transcriptional errors that result from this process are unintentional.

## 2020-03-16 LAB — CBC WITH DIFFERENTIAL/PLATELET
Absolute Monocytes: 495 cells/uL (ref 200–950)
Basophils Absolute: 112 cells/uL (ref 0–200)
Basophils Relative: 1.7 %
Eosinophils Absolute: 132 cells/uL (ref 15–500)
Eosinophils Relative: 2 %
HCT: 44.5 % (ref 38.5–50.0)
Hemoglobin: 15.5 g/dL (ref 13.2–17.1)
Lymphs Abs: 2138 cells/uL (ref 850–3900)
MCH: 33.5 pg — ABNORMAL HIGH (ref 27.0–33.0)
MCHC: 34.8 g/dL (ref 32.0–36.0)
MCV: 96.3 fL (ref 80.0–100.0)
MPV: 12.1 fL (ref 7.5–12.5)
Monocytes Relative: 7.5 %
Neutro Abs: 3722 cells/uL (ref 1500–7800)
Neutrophils Relative %: 56.4 %
Platelets: 117 10*3/uL — ABNORMAL LOW (ref 140–400)
RBC: 4.62 10*6/uL (ref 4.20–5.80)
RDW: 12.5 % (ref 11.0–15.0)
Total Lymphocyte: 32.4 %
WBC: 6.6 10*3/uL (ref 3.8–10.8)

## 2020-03-16 LAB — COMPREHENSIVE METABOLIC PANEL
AG Ratio: 1.1 (calc) (ref 1.0–2.5)
ALT: 23 U/L (ref 9–46)
AST: 42 U/L — ABNORMAL HIGH (ref 10–35)
Albumin: 3.9 g/dL (ref 3.6–5.1)
Alkaline phosphatase (APISO): 85 U/L (ref 35–144)
BUN: 10 mg/dL (ref 7–25)
CO2: 25 mmol/L (ref 20–32)
Calcium: 9.3 mg/dL (ref 8.6–10.3)
Chloride: 102 mmol/L (ref 98–110)
Creat: 1.17 mg/dL (ref 0.70–1.25)
Globulin: 3.7 g/dL (calc) (ref 1.9–3.7)
Glucose, Bld: 141 mg/dL — ABNORMAL HIGH (ref 65–99)
Potassium: 4.4 mmol/L (ref 3.5–5.3)
Sodium: 137 mmol/L (ref 135–146)
Total Bilirubin: 1.3 mg/dL — ABNORMAL HIGH (ref 0.2–1.2)
Total Protein: 7.6 g/dL (ref 6.1–8.1)

## 2020-03-16 LAB — HEMOGLOBIN A1C
Hgb A1c MFr Bld: 7.4 % of total Hgb — ABNORMAL HIGH (ref <5.7)
Mean Plasma Glucose: 166 (calc)
eAG (mmol/L): 9.2 (calc)

## 2020-03-16 LAB — LIPID PANEL
Cholesterol: 136 mg/dL (ref <200)
HDL: 37 mg/dL — ABNORMAL LOW (ref >=40)
LDL Cholesterol (Calc): 74 mg/dL (calc)
Non-HDL Cholesterol (Calc): 99 mg/dL (calc) (ref <130)
Total CHOL/HDL Ratio: 3.7 (calc) (ref <5.0)
Triglycerides: 170 mg/dL — ABNORMAL HIGH (ref <150)

## 2020-03-16 LAB — MICROALBUMIN / CREATININE URINE RATIO
Creatinine, Urine: 206 mg/dL (ref 20–320)
Microalb Creat Ratio: 6 mcg/mg creat (ref <30)
Microalb, Ur: 1.3 mg/dL

## 2020-03-16 LAB — VITAMIN D 25 HYDROXY (VIT D DEFICIENCY, FRACTURES): Vit D, 25-Hydroxy: 74 ng/mL (ref 30–100)

## 2020-03-18 ENCOUNTER — Telehealth: Payer: Self-pay

## 2020-03-19 ENCOUNTER — Other Ambulatory Visit: Payer: Self-pay | Admitting: *Deleted

## 2020-03-19 DIAGNOSIS — D696 Thrombocytopenia, unspecified: Secondary | ICD-10-CM

## 2020-03-19 DIAGNOSIS — E669 Obesity, unspecified: Secondary | ICD-10-CM

## 2020-03-19 DIAGNOSIS — E1169 Type 2 diabetes mellitus with other specified complication: Secondary | ICD-10-CM

## 2020-03-19 MED ORDER — GLIPIZIDE ER 5 MG PO TB24: 5.0000 mg | ORAL_TABLET | Freq: Every day | ORAL | 0 refills | Status: DC

## 2020-03-19 NOTE — Addendum Note (Signed)
Addended by: [name] F on: 03/19/2020 09:09 PM   Modules accepted: Orders

## 2020-03-20 DIAGNOSIS — G4733 Obstructive sleep apnea (adult) (pediatric): Secondary | ICD-10-CM | POA: Diagnosis not present

## 2020-03-22 ENCOUNTER — Telehealth: Payer: Self-pay | Admitting: Pharmacist

## 2020-03-22 NOTE — Progress Notes (Signed)
Chronic Care Management Pharmacy Assistant   Name: Peter Dunlap  MRN: [id number] DOB: [birth year]  Reason for Encounter: Initial Questions   PCP : [name], MD  Allergies:   Allergies  Allergen Reactions  . Trazodone And Nefazodone     Nightmares  . Lactose Intolerance (Gi) Other (See Comments)    UPSET STOMACH     Medications: Outpatient Encounter Medications as of 03/22/2020  Medication Sig  . allopurinol (ZYLOPRIM) 100 MG tablet TAKE 1 TABLET(100 MG) BY MOUTH DAILY  . aspirin EC 325 MG tablet Take 1 tablet (325 mg total) daily by mouth.  . Blood Glucose Monitoring Suppl (BLOOD GLUCOSE SYSTEM PAK) KIT Use as directed to monitor FSBS 1x daily. Dx: E11.9.  . Cyanocobalamin (VITAMIN B 12) 500 MCG TABS Take by mouth.  . DEXILANT 60 MG capsule TAKE 1 CAPSULE(60 MG) BY MOUTH DAILY  . furosemide (LASIX) 20 MG tablet TAKE 3 TABLETS BY MOUTH DAILY  . glipiZIDE (GLUCOTROL XL) 5 MG 24 hr tablet Take 1 tablet (5 mg total) by mouth daily with breakfast.  . isosorbide mononitrate (IMDUR) 60 MG 24 hr tablet TAKE 1 TABLET BY MOUTH DAILY  . levofloxacin (LEVAQUIN) 750 MG tablet Take 1 tablet (750 mg total) by mouth daily.  [name] levothyroxine (SYNTHROID) 200 MCG tablet TAKE 1 TABLET(200 MCG) BY MOUTH DAILY BEFORE BREAKFAST  . loratadine (CLARITIN) 10 MG tablet Take 1 tablet (10 mg total) by mouth daily as needed for allergies.  . Magnesium Oxide 400 (240 Mg) MG TABS TAKE 1 TABLET BY MOUTH TWICE DAILY  . metFORMIN (GLUCOPHAGE) 500 MG tablet TAKE 1 TABLET BY MOUTH TWICE DAILY  . nitroGLYCERIN (NITROSTAT) 0.4 MG SL tablet PLACE 1 TABLET UNDER THE TONGUE EVERY 5 MINUTES AS NEEDED FOR CHEST PAIN. CALL 911 AT THIRD DOSE IN 15 MINUTES  . ONETOUCH ULTRA test strip USE TO TEST ONCE DAILY  . oxyCODONE-acetaminophen (PERCOCET) 10-325 MG tablet Take 1 tablet by mouth every 8 (eight) hours as needed for pain.  . potassium chloride SA (KLOR-CON) 20 MEQ tablet Take 1 tablet (20 mEq total) by  mouth daily.  . silodosin (RAPAFLO) 8 MG CAPS capsule Take 1 capsule (8 mg total) by mouth 2 (two) times daily.  . simvastatin (ZOCOR) 40 MG tablet TAKE 1 TABLET(40 MG) BY MOUTH DAILY  . sotalol (BETAPACE) 160 MG tablet TAKE 1 TABLET(160 MG) BY MOUTH TWICE DAILY  . Vitamin D, Ergocalciferol, (DRISDOL) 1.25 MG (50000 UNIT) CAPS capsule TAKE 1 CAPSULE BY MOUTH EVERY 7 DAYS   No facility-administered encounter medications on file as of 03/22/2020.    Current Diagnosis: Patient Active Problem List   Diagnosis Date Noted  . Bilateral carotid artery stenosis 08/12/2019  . Carpal tunnel syndrome 07/03/2018  . Ulnar neuropathy at elbow 07/03/2018  . Essential hypertension 02/21/2018  . Vitamin D deficiency 02/21/2018  . Presence of Watchman left atrial appendage closure device 04/12/2017  . Chronic pain syndrome 10/17/2016  . Prostate cancer ([place]) 03/10/2016  . History of right-sided carotid endarterectomy 11/25/2015  . Encounter for postoperative carotid endarterectomy surveillance 11/25/2015  . Paroxysmal atrial fibrillation (HCC)   . Pacemaker-St Jude 09/18/2014  . Tachy-brady syndrome ([place]) 08/19/2014  . Chronic insomnia 07/24/2014  . OA (osteoarthritis) of knee 05/26/2014  . S/P subdural hematoma evacuation-Nov 2015 04/10/2014  . 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 Chronic diastolic heart failure ([place]) 03/04/2014  . 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 Gout 02/17/2014  . OSA (obstructive sleep  apnea) 02/17/2014  . Class 3 obesity 12/04/2013  . Type 2 diabetes mellitus with neurological complications ([place]) 68/61/6837  . Hypertrophic cardiomyopathy ([place]) 09/07/2011  . Hypertensive heart disease with CHF (congestive heart failure) ([place])   . CAD (coronary artery disease)   . Cerebrovascular disease   . Mixed hyperlipidemia   . ERECTILE DYSFUNCTION, ORGANIC 08/11/2010  . PSVT (paroxysmal supraventricular tachycardia) ([place])  06/03/2008  . Hereditary and idiopathic peripheral neuropathy 01/28/2008  . BPH (benign prostatic hypertrophy) 05/11/2006  . GERD 04/23/2006    Goals Addressed   None     Chronic Care Management   Outreach Note  03/22/2020 Name: Peter Dunlap MRN: [id number] DOB: 1955-10-14  Referred by: [name], MD Reason for referral : Chronic Care Management   An unsuccessful telephone outreach was attempted today. The patient was referred to the pharmacist for assistance with care management and care coordination.   Unable to make contact with the patient prior to his initial visit on Tuesday October 19 th at 9 am over the telephone.   Follow-Up:  Pharmacist Review   Fanny Skates, [place] Pharmacist Assistant [phone]

## 2020-03-22 NOTE — Chronic Care Management (AMB) (Signed)
Chronic Care Management Pharmacy  Name: Peter Dunlap  MRN: [id number] DOB: 1955-11-27  Chief Complaint/ HPI  Peter Dunlap,  64 y.o. , male presents for their Initial CCM visit with the clinical pharmacist via telephone.  PCP : [name], MD  Their chronic conditions include: CAD, CHF, Afib, HTN, GERD, Type II DM, Hypothyroidism, HLD, Gout, Chronic Pain.  Office Visits: 03/15/2020 [facility]) -   Reports checking blood glucose once weekly and it is in the 120s, last A1c was 7.%.  He reports that his fingers lock up sometimes and he has to pop or pry them ortho did not recommend any surgical intervention.  Flu shot was given and complete labs ordered.  Post labs his glipizide was increased to $RemoveBefo'5mg'dZrDwXDzHLT$  once daily because his A1c was 7.4%.  Consult Visit: 02/10/2020 (Urology) -   Evaluating for recurrence of prostate cancer.  Biopsy came back positive for adenocarcinoma.  CT of abdomen and pelvis ordered to confirm.  Medications: Outpatient Encounter Medications as of 03/23/2020  Medication Sig  . allopurinol (ZYLOPRIM) 100 MG tablet TAKE 1 TABLET(100 MG) BY MOUTH DAILY  . aspirin EC 325 MG tablet Take 1 tablet (325 mg total) daily by mouth.  . Blood Glucose Monitoring Suppl (BLOOD GLUCOSE SYSTEM PAK) KIT Use as directed to monitor FSBS 1x daily. Dx: E11.9.  . Cyanocobalamin (VITAMIN B 12) 500 MCG TABS Take by mouth.  . DEXILANT 60 MG capsule TAKE 1 CAPSULE(60 MG) BY MOUTH DAILY  . furosemide (LASIX) 20 MG tablet TAKE 3 TABLETS BY MOUTH DAILY  . glipiZIDE (GLUCOTROL XL) 5 MG 24 hr tablet Take 1 tablet (5 mg total) by mouth daily with breakfast.  . isosorbide mononitrate (IMDUR) 60 MG 24 hr tablet TAKE 1 TABLET BY MOUTH DAILY  . levothyroxine (SYNTHROID) 200 MCG tablet TAKE 1 TABLET(200 MCG) BY MOUTH DAILY BEFORE BREAKFAST  . loratadine (CLARITIN) 10 MG tablet Take 1 tablet (10 mg total) by mouth daily as needed for allergies.  . Magnesium Oxide 400 (240 Mg) MG TABS TAKE 1  TABLET BY MOUTH TWICE DAILY  . metFORMIN (GLUCOPHAGE) 500 MG tablet TAKE 1 TABLET BY MOUTH TWICE DAILY  . nitroGLYCERIN (NITROSTAT) 0.4 MG SL tablet PLACE 1 TABLET UNDER THE TONGUE EVERY 5 MINUTES AS NEEDED FOR CHEST PAIN. CALL 911 AT THIRD DOSE IN 15 MINUTES  . ONETOUCH ULTRA test strip USE TO TEST ONCE DAILY  . oxyCODONE-acetaminophen (PERCOCET) 10-325 MG tablet Take 1 tablet by mouth every 8 (eight) hours as needed for pain.  . potassium chloride SA (KLOR-CON) 20 MEQ tablet Take 1 tablet (20 mEq total) by mouth daily.  . silodosin (RAPAFLO) 8 MG CAPS capsule Take 1 capsule (8 mg total) by mouth 2 (two) times daily.  . simvastatin (ZOCOR) 40 MG tablet TAKE 1 TABLET(40 MG) BY MOUTH DAILY  . sotalol (BETAPACE) 160 MG tablet TAKE 1 TABLET(160 MG) BY MOUTH TWICE DAILY  . Vitamin D, Ergocalciferol, (DRISDOL) 1.25 MG (50000 UNIT) CAPS capsule TAKE 1 CAPSULE BY MOUTH EVERY 7 DAYS  . levofloxacin (LEVAQUIN) 750 MG tablet Take 1 tablet (750 mg total) by mouth daily. (Patient not taking: Reported on 03/23/2020)   No facility-administered encounter medications on file as of 03/23/2020.     Current Diagnosis/Assessment:   [employer]:   . Difficulty of Paying Living Expenses: Not on file    Goals Addressed            This Visit's Progress   . Pharmacy Care Plan:  CARE PLAN ENTRY (see longitudinal plan of care for additional care plan information)  Current Barriers:  . Chronic Disease Management support, education, and care coordination needs related to Hypertension, Hyperlipidemia, Diabetes, Heart Failure, and Hypothyroidism   Hypertension BP Readings from Last 3 Encounters:  03/15/20 110/68  02/10/20 (!) 104/57  01/27/20 (!) 160/80   . Pharmacist Clinical Goal(s): o Over the next 180 days, patient will work with PharmD and providers to maintain BP goal <130/80 . Current regimen:  o Isosorbide 60mg  o Sotalol 160mg  . Interventions: o Reviewed home BP monitoring  procedures o Reviewed office BP . Patient self care activities - Over the next 180 days, patient will: o Check BP daily, document, and provide at future appointments o Ensure daily salt intake < 2300 mg/day  Hyperlipidemia Lab Results  Component Value Date/Time   LDLCALC 74 03/15/2020 11:42 AM   . Pharmacist Clinical Goal(s): o Over the next 180 days, patient will work with PharmD and providers to maintain LDL goal < 70 . Current regimen:  o Simvastatin 40mg  . Interventions: o Reviewed most recent lipid panel o Counseled on goals for TG < 150 o Recommended dietary modifications such as cutting back on sodas and sweets (cookies). . Patient self care activities - Over the next 180 days, patient will: o Continue to focus on medication adherence o Work towards dietary modifications that promote better cholesterol  Diabetes Lab Results  Component Value Date/Time   HGBA1C 7.4 (H) 03/15/2020 11:42 AM   HGBA1C 7.0 (A) 01/01/2020 10:46 AM   HGBA1C 6.8 (H) 08/25/2019 08:48 AM   HGBA1C 6.1 06/26/2015 12:00 AM   HGBA1C 4.8 04/21/2014 09:47 AM   . Pharmacist Clinical Goal(s): o Over the next 180 days, patient will work with PharmD and providers to achieve A1c goal <7% . Current regimen:  o Metformin 500mg  twice daily o Glipizide XL 5mg  daily . Interventions: o Reviewed home monitoring FBG o Discussed dietary modifications to bring sugar closer to goal o Recommended repeat A1c . Patient self care activities - Over the next 180 days, patient will: o Check blood sugar once daily, document, and provide at future appointments o Contact provider with any episodes of hypoglycemia o Work to limit sodas and cookies and watch other servings of carbohydrates and sugars  HF . Pharmacist Clinical Goal(s) o Over the next 180 days, patient will work with PharmD and providers to optimize medication and minimize symptoms of HF. . Current regimen:   Isosorbide mononitrate 60mg  daily  Furosemide  20mg  three tablets daily . Interventions: o Recommended daily weight monitoring. o Counseled on importance of medication adherence. . Patient self care activities - Over the next 180 days, patient will: o Continue to focus on adherence o Contact providers with weight gain of more than 3 lbs. In one day or 5 lbs. In one week  Hypothyroidism . Pharmacist Clinical Goal(s) o Over the next 180 days, patient will work with PharmD and providers to optimize medication and minimize symptoms of hypothyroidism. . Current regimen:  o Levothyroxine 200mg  . Interventions: o Counseled on proper medication administration (empty stomach every morning) o Discussed signs and symptoms of hypothyroidism . Patient self care activities - Over the next 180 days, patient will: o Work on medication adherence and proper administration of medication on an empty stomach for better absorption Initial goal documentation        Heart Failure    Last ejection fraction: 60-65%   Patient has failed these meds in past: none  noted Patient is currently controlled on the following medications:   Isosorbide mononitrate 60mg  daily  Furosemide 20mg  three tablets daily  We discussed weighing daily; if you gain more than 3 pounds in one day or 5 pounds in one week call your doctor.  He denies swelling.  Reports that lasix makes him use the restroom frequently, counseled that this was normal and he is losing the excess fluid through his urine.  Plan  Continue current medications AFIB   Patient is currently rate controlled.  Patient has failed these meds in past: none noted Patient is currently controlled on the following medications:   Sotalol 160mg  daily  ASA 325mg  daily   We discussed:    Denies chest pain, abnormal bleeding  Adherent with medication  Plan  Continue current medications   Hypertension   BP goal is:  <130/80  Office blood pressures are  BP Readings from Last 3  Encounters:  03/15/20 110/68  02/10/20 (!) 104/57  01/27/20 (!) 160/80   Patient checks BP at home infrequently Patient home BP readings are ranging: no logs  Patient has failed these meds in the past: none noted Patient is currently controlled on the following medications:  . Sotalol 160mg  . Imdur 60mg  daily  We discussed   He does not check his blood pressure at home, office BP has always been normal to borderline low  Adherent to medication, denies dizziness, headaches  Plan  Continue current medications     Hyperlipidemia   LDL goal < 70  Lipid Panel     Component Value Date/Time   CHOL 136 03/15/2020 1142   TRIG 170 (H) 03/15/2020 1142   HDL 37 (L) 03/15/2020 1142   LDLCALC 74 03/15/2020 1142    Hepatic Function Latest Ref Rng & Units 03/15/2020 05/19/2019 05/16/2019  Total Protein 6.1 - 8.1 g/dL 7.6 7.6 7.8  Albumin 3.6 - 5.1 g/dL - - -  AST 10 - 35 U/L 42(H) 29 43(H)  ALT 9 - 46 U/L 23 17 20   Alk Phosphatase 40 - 115 U/L - - -  Total Bilirubin 0.2 - 1.2 mg/dL 1.3(H) 0.9 1.1  Bilirubin, Direct 0.0 - 0.3 mg/dL - - -     The 10-year ASCVD risk score Mikey Bussing DC Jr., et al., 2013) is: 20.9%   Values used to calculate the score:     Age: 47 years     Sex: Male     Is Non-Hispanic African American: Yes     Diabetic: Yes     Tobacco smoker: No     Systolic Blood Pressure: 161 mmHg     Is BP treated: Yes     HDL Cholesterol: 37 mg/dL     Total Cholesterol: 136 mg/dL   Patient has failed these meds in past: none noted Patient is currently controlled on the following medications:  . Simvastatin 40mg   We discussed:    He denies myalgias  Does report that he drinks sodas sometimes and has occasional sweet treat  Counseled on importance of controlling cholesterol and dietary recommendations including:  Increased water intake, replacing sodas with other form of beverage   Plan  Continue current medications Diabetes   A1c goal <7%  Recent Relevant  Labs: Lab Results  Component Value Date/Time   HGBA1C 7.4 (H) 03/15/2020 11:42 AM   HGBA1C 7.0 (A) 01/01/2020 10:46 AM   HGBA1C 6.8 (H) 08/25/2019 08:48 AM   HGBA1C 6.1 06/26/2015 12:00 AM   HGBA1C 4.8 04/21/2014 09:47 AM  GFR 69.60 08/27/2014 11:48 AM   GFR 112.78 02/16/2014 12:18 PM   MICROALBUR 1.3 03/15/2020 11:42 AM   MICROALBUR 4.1 11/05/2015 09:06 AM    Last diabetic Eye exam:  Lab Results  Component Value Date/Time   HMDIABEYEEXA No Retinopathy 07/10/2018 12:00 AM    Last diabetic Foot exam: No results found for: HMDIABFOOTEX   Checking BG: Daily  Recent FBG Readings: 120s mostly per patient, however 200 this am  Patient has failed these meds in past: none noted Patient is currently uncontrolled on the following medications: [name] Metformin 500mg  twice daily . Glipizide XL 5mg  daily (he reports twice daily)  We discussed:   He reports taking glipizide twice daily  He reported one sugar level of 500 after he had some cookies, reports that was an outlier and most are around 120s  We discussed limiting carbohydrates and sweet snacks.  Denies hypoglycemia < 70.  Recommend repeat A1c in 3 months to follow closely.  Plan  Continue current medications, recommend repeat A1c in 3 mo. Hypothyroidism   Lab Results  Component Value Date/Time   TSH 12.05 (H) 12/24/2019 08:35 AM   TSH 4.84 (H) 05/19/2019 08:25 AM   FREET4 1.5 12/24/2019 08:35 AM   FREET4 1.4 05/19/2019 08:25 AM    Patient has failed these meds in past: none noted Patient is currently uncontrolled on the following medications:  . Levothyroxine 240mcg tabs  We discussed:    Followed by endocrinology  Per their note, compliance is an issue with this medication  Counseled on compliance, first thing in the morning on empty stomach  TSH severely elevated in July.  Endo is aware.  Recommend repeat TSH to see current levels, he is on max dose  Plan  Continue current medications GERD   Patient  has failed these meds in past: none noted Patient is currently controlled on the following medications:  . Dexilant 60mg   We discussed:    He reports minimal symptoms depending on what he eats  Counseled on trigger food avoidance  Also counseled on medication administration, 30 min before eating daily to ensure efficacy  Plan  Continue current medications CAD   Patient has failed these meds in past: none noted Patient is currently controlled on the following medications:  . ASA 325mg   We discussed:    Working to control other risk factors  Denies bleeding/bruising  Counseled on diet/exercise  Plan  Continue current medications  Gout   Patient has failed these meds in past: none noted Patient is currently controlled on the following medications:  . Allopurinol 100mg  daily  We discussed:  Takes medication daily, no gout attacks recently.  Plan  Continue current medications  Chronic Pain   Patient has failed these meds in past: none noted Patient is currently controlled on the following medications:  . Percocet 10-325mg  q8h prnP  We discussed:   Takes every 8 hours as directed, reports pain in fingers and knuckles  Percocet still relieves pain  No concerns at this time  Plan  Continue current medications . Vaccines   Reviewed and discussed patient's vaccination history.    Immunization History  Administered Date(s) Administered  . Influenza,inj,Quad PF,6+ Mos 02/17/2014, 03/09/2015, 03/10/2016, 03/27/2017, 03/20/2018, 03/04/2019, 03/15/2020  . Moderna SARS-COVID-2 Vaccination 08/03/2019, 09/06/2019  . Pneumococcal Polysaccharide-23 01/05/2014  . Tdap 12/19/2010  . Zoster Recombinat (Shingrix) 12/17/2019, 02/15/2020    Plan  Recommended patient receive COVID-19 booster vaccine.  Medication Management   . Miscellaneous medications: Rapaflo 8mg , Potassium 82meq,  Vitamin D 50,000u . OTC's:  o ASA $Rem'325mg'tEvP$  o V12 529mcg o Loratadine  $RemoveBef'10mg'TctTnfFigX$  o Magnesium Oxide $RemoveBeforeD'400mg'dmAfZGLAyWjGrQ$  . Patient currently uses [facility] [facility].  Phone #  [phone] . Patient reports using pill box method to organize medications and promote adherence. . Patient denies missed doses of medication.   Beverly Milch, PharmD Clinical Pharmacist [place] [phone]

## 2020-03-23 ENCOUNTER — Ambulatory Visit: Payer: Medicare Other | Admitting: Pharmacist

## 2020-03-23 DIAGNOSIS — E890 Postprocedural hypothyroidism: Secondary | ICD-10-CM

## 2020-03-23 DIAGNOSIS — I10 Essential (primary) hypertension: Secondary | ICD-10-CM

## 2020-03-23 DIAGNOSIS — E782 Mixed hyperlipidemia: Secondary | ICD-10-CM

## 2020-03-23 DIAGNOSIS — E1149 Type 2 diabetes mellitus with other diabetic neurological complication: Secondary | ICD-10-CM

## 2020-03-23 DIAGNOSIS — I5032 Chronic diastolic (congestive) heart failure: Secondary | ICD-10-CM

## 2020-03-23 NOTE — Patient Instructions (Addendum)
Visit Information Thank you for meeting with me today!  I look forward to working with you to help you meet all of your healthcare goals and answer any questions you may have.  Feel free to contact me anytime!  Goals Addressed            This Visit's Progress   . Pharmacy Care Plan:       CARE PLAN ENTRY (see longitudinal plan of care for additional care plan information)  Current Barriers:  . Chronic Disease Management support, education, and care coordination needs related to Hypertension, Hyperlipidemia, Diabetes, Heart Failure, and Hypothyroidism   Hypertension BP Readings from Last 3 Encounters:  03/15/20 110/68  02/10/20 (!) 104/57  01/27/20 (!) 160/80   . Pharmacist Clinical Goal(s): o Over the next 180 days, patient will work with PharmD and providers to maintain BP goal <130/80 . Current regimen:  o Isosorbide 60mg  o Sotalol 160mg  . Interventions: o Reviewed home BP monitoring procedures o Reviewed office BP . Patient self care activities - Over the next 180 days, patient will: o Check BP daily, document, and provide at future appointments o Ensure daily salt intake < 2300 mg/day  Hyperlipidemia Lab Results  Component Value Date/Time   LDLCALC 74 03/15/2020 11:42 AM   . Pharmacist Clinical Goal(s): o Over the next 180 days, patient will work with PharmD and providers to maintain LDL goal < 70 . Current regimen:  o Simvastatin 40mg  . Interventions: o Reviewed most recent lipid panel o Counseled on goals for TG < 150 o Recommended dietary modifications such as cutting back on sodas and sweets (cookies). . Patient self care activities - Over the next 180 days, patient will: o Continue to focus on medication adherence o Work towards dietary modifications that promote better cholesterol  Diabetes Lab Results  Component Value Date/Time   HGBA1C 7.4 (H) 03/15/2020 11:42 AM   HGBA1C 7.0 (A) 01/01/2020 10:46 AM   HGBA1C 6.8 (H) 08/25/2019 08:48 AM   HGBA1C 6.1  06/26/2015 12:00 AM   HGBA1C 4.8 04/21/2014 09:47 AM   . Pharmacist Clinical Goal(s): o Over the next 180 days, patient will work with PharmD and providers to achieve A1c goal <7% . Current regimen:  o Metformin 500mg  twice daily o Glipizide XL 5mg  daily . Interventions: o Reviewed home monitoring FBG o Discussed dietary modifications to bring sugar closer to goal o Recommended repeat A1c . Patient self care activities - Over the next 180 days, patient will: o Check blood sugar once daily, document, and provide at future appointments o Contact provider with any episodes of hypoglycemia o Work to limit sodas and cookies and watch other servings of carbohydrates and sugars  HF . Pharmacist Clinical Goal(s) o Over the next 180 days, patient will work with PharmD and providers to optimize medication and minimize symptoms of HF. . Current regimen:   Isosorbide mononitrate 60mg  daily  Furosemide 20mg  three tablets daily . Interventions: o Recommended daily weight monitoring. o Counseled on importance of medication adherence. . Patient self care activities - Over the next 180 days, patient will: o Continue to focus on adherence o Contact providers with weight gain of more than 3 lbs. In one day or 5 lbs. In one week  Hypothyroidism . Pharmacist Clinical Goal(s) o Over the next 180 days, patient will work with PharmD and providers to optimize medication and minimize symptoms of hypothyroidism. . Current regimen:  o Levothyroxine 200mg  . Interventions: o Counseled on proper medication administration (empty stomach  every morning) o Discussed signs and symptoms of hypothyroidism . Patient self care activities - Over the next 180 days, patient will: o Work on medication adherence and proper administration of medication on an empty stomach for better absorption Initial goal documentation        Peter Dunlap was given information about Chronic Care Management services today including:   1. CCM service includes personalized support from designated clinical staff supervised by his physician, including individualized plan of care and coordination with other care providers 2. 24/7 contact phone numbers for assistance for urgent and routine care needs. 3. Standard insurance, coinsurance, copays and deductibles apply for chronic care management only during months in which we provide at least 20 minutes of these services. Most insurances cover these services at 100%, however patients may be responsible for any copay, coinsurance and/or deductible if applicable. This service may help you avoid the need for more expensive face-to-face services. 4. Only one practitioner may furnish and bill the service in a calendar month. 5. The patient may stop CCM services at any time (effective at the end of the month) by phone call to the office staff.  Patient agreed to services and verbal consent obtained.   The patient verbalized understanding of instructions provided today and agreed to receive a mailed copy of patient instruction and/or educational materials. Telephone follow up appointment with pharmacy team member scheduled for: 6 months  Beverly Milch, PharmD Clinical Pharmacist [facility] [phone]   Heart Failure, Self Care Heart failure is a serious condition. This sheet explains things you need to do to take care of yourself at home. To help you stay as healthy as possible, you may be asked to change your diet, take certain medicines, and make other changes in your life. Your doctor may also give you more specific instructions. If you have problems or questions, call your doctor. What are the risks? Having heart failure makes it more likely for you to have some problems. These problems can get worse if you do not take good care of yourself. Problems may include:  Blood clotting problems. This may cause a stroke.  Damage to the kidneys, liver, or  lungs.  Abnormal heart rhythms. Supplies needed:  Scale for weighing yourself.  Blood pressure monitor.  Notebook.  Medicines. How to care for yourself when you have heart failure Medicines Take over-the-counter and prescription medicines only as told by your doctor. Take your medicines every day.  Do not stop taking your medicine unless your doctor tells you to do so.  Do not skip any medicines.  Get your prescriptions refilled before you run out of medicine. This is important. Eating and drinking   Eat heart-healthy foods. Talk with a diet specialist (dietitian) to create an eating plan.  Choose foods that: ? Have no trans fat. ? Are low in saturated fat and cholesterol.  Choose healthy foods, such as: ? Fresh or frozen fruits and vegetables. ? Fish. ? Low-fat (lean) meats. ? Legumes, such as beans, peas, and lentils. ? Fat-free or low-fat dairy products. ? Whole-grain foods. ? High-fiber foods.  Limit salt (sodium) if told by your doctor. Ask your diet specialist to tell you which seasonings are healthy for your heart.  Cook in healthy ways instead of frying. Healthy ways of cooking include roasting, grilling, broiling, baking, poaching, steaming, and stir-frying.  Limit how much fluid you drink, if told by your doctor. Alcohol use  Do not drink alcohol if: ? Your doctor  tells you not to drink. ? Your heart was damaged by alcohol, or you have very bad heart failure. ? You are pregnant, may be pregnant, or are planning to become pregnant.  If you drink alcohol: ? Limit how much you use to:  0-1 drink a day for women.  0-2 drinks a day for men. ? Be aware of how much alcohol is in your drink. In the [place] one drink equals one 12 oz bottle of beer (355 mL), one 5 oz glass of wine (148 mL), or one 1 oz glass of hard liquor (44 mL). Lifestyle   Do not use any products that contain nicotine or tobacco, such as cigarettes, e-cigarettes, and chewing tobacco.  If you need help quitting, ask your doctor. ? Do not use nicotine gum or patches before talking to your doctor.  Do not use illegal drugs.  Lose weight if told by your doctor.  Do physical activity if told by your doctor. Talk to your doctor before you begin an exercise if: ? You are an older adult. ? You have very bad heart failure.  Learn to manage stress. If you need help, ask your doctor.  Get rehab (rehabilitation) to help you stay independent and to help with your quality of life.  Plan time to rest when you get tired. Check weight and blood pressure   Weigh yourself every day. This will help you to know if fluid is building up in your body. ? Weigh yourself every morning after you pee (urinate) and before you eat breakfast. ? Wear the same amount of clothing each time. ? Write down your daily weight. Give your record to your doctor.  Check and write down your blood pressure as told by your doctor.  Check your pulse as told by your doctor. Dealing with very hot and very cold weather  If it is very hot: ? Avoid activities that take a lot of energy. ? Use air conditioning or fans, or find a cooler place. ? Avoid caffeine and alcohol. ? Wear clothing that is loose-fitting, lightweight, and light-colored.  If it is very cold: ? Avoid activities that take a lot of energy. ? Layer your clothes. ? Wear mittens or gloves, a hat, and a scarf when you go outside. ? Avoid alcohol. Follow these instructions at home:  Stay up to date with shots (vaccines). Get pneumococcal and flu (influenza) shots.  Keep all follow-up visits as told by your doctor. This is important. Contact a doctor if:  You gain weight quickly.  You have increasing shortness of breath.  You cannot do your normal activities.  You get tired easily.  You cough a lot.  You don't feel like eating or feel like you may vomit (nauseous).  You become puffy (swell) in your hands, feet, ankles, or belly  (abdomen).  You cannot sleep well because it is hard to breathe.  You feel like your heart is beating fast (palpitations).  You get dizzy when you stand up. Get help right away if:  You have trouble breathing.  You or someone else notices a change in your behavior, such as having trouble staying awake.  You have chest pain or discomfort.  You pass out (faint). These symptoms may be an emergency. Do not wait to see if the symptoms will go away. Get medical help right away. Call your local emergency services (911 in the [place] Do not drive yourself to the hospital. Summary  Heart failure is a serious condition. To  care for yourself, you may have to change your diet, take medicines, and make other lifestyle changes.  Take your medicines every day. Do not stop taking them unless your doctor tells you to do so.  Eat heart-healthy foods, such as fresh or frozen fruits and vegetables, fish, lean meats, legumes, fat-free or low-fat dairy products, and whole-grain or high-fiber foods.  Ask your doctor if you can drink alcohol. You may have to stop alcohol use if you have very bad heart failure.  Contact your doctor if you gain weight quickly or feel that your heart is beating too fast. Get help right away if you pass out, or have chest pain or trouble breathing. This information is not intended to replace advice given to you by your health care provider. Make sure you discuss any questions you have with your health care provider. Document Revised: 09/03/2018 Document Reviewed: 09/04/2018 Elsevier Patient Education  [place].

## 2020-03-25 ENCOUNTER — Other Ambulatory Visit: Payer: Self-pay | Admitting: Urology

## 2020-03-25 DIAGNOSIS — C61 Malignant neoplasm of prostate: Secondary | ICD-10-CM

## 2020-03-25 NOTE — Telephone Encounter (Signed)
I called pt--I discussed results and ordered a PET scan.

## 2020-03-29 NOTE — Addendum Note (Signed)
Addended by: Dorisann Frames on: 03/29/2020 10:19 AM   Modules accepted: Orders

## 2020-03-30 ENCOUNTER — Other Ambulatory Visit: Payer: Self-pay | Admitting: Nurse Practitioner

## 2020-03-30 ENCOUNTER — Telehealth: Payer: Self-pay

## 2020-03-30 DIAGNOSIS — E1165 Type 2 diabetes mellitus with hyperglycemia: Secondary | ICD-10-CM | POA: Diagnosis not present

## 2020-03-30 DIAGNOSIS — E890 Postprocedural hypothyroidism: Secondary | ICD-10-CM | POA: Diagnosis not present

## 2020-03-30 DIAGNOSIS — E559 Vitamin D deficiency, unspecified: Secondary | ICD-10-CM | POA: Diagnosis not present

## 2020-03-30 MED ORDER — OXYCODONE-ACETAMINOPHEN 10-325 MG PO TABS
1.0000 | ORAL_TABLET | Freq: Three times a day (TID) | ORAL | 0 refills | Status: DC | PRN
Start: 2020-03-30 — End: 2020-04-28

## 2020-03-30 NOTE — Telephone Encounter (Signed)
Pt LVM and is wanting his pain meds  Please Advise.

## 2020-03-30 NOTE — Addendum Note (Signed)
Addended by: [name] F on: 03/30/2020 07:45 AM   Modules accepted: Orders

## 2020-03-30 NOTE — Telephone Encounter (Signed)
medcation refilled

## 2020-03-31 LAB — COMPLETE METABOLIC PANEL WITH GFR
AG Ratio: 1.1 (calc) (ref 1.0–2.5)
ALT: 13 U/L (ref 9–46)
AST: 27 U/L (ref 10–35)
Albumin: 3.8 g/dL (ref 3.6–5.1)
Alkaline phosphatase (APISO): 74 U/L (ref 35–144)
BUN: 10 mg/dL (ref 7–25)
CO2: 24 mmol/L (ref 20–32)
Calcium: 9.3 mg/dL (ref 8.6–10.3)
Chloride: 104 mmol/L (ref 98–110)
Creat: 1.04 mg/dL (ref 0.70–1.25)
GFR, Est African American: 88 mL/min/{1.73_m2} (ref >=60)
GFR, Est Non African American: 76 mL/min/{1.73_m2} (ref >=60)
Globulin: 3.5 g/dL (calc) (ref 1.9–3.7)
Glucose, Bld: 136 mg/dL — ABNORMAL HIGH (ref 65–99)
Potassium: 4 mmol/L (ref 3.5–5.3)
Sodium: 139 mmol/L (ref 135–146)
Total Bilirubin: 1.2 mg/dL (ref 0.2–1.2)
Total Protein: 7.3 g/dL (ref 6.1–8.1)

## 2020-03-31 LAB — LIPID PANEL
Cholesterol: 122 mg/dL (ref <200)
HDL: 37 mg/dL — ABNORMAL LOW (ref >=40)
LDL Cholesterol (Calc): 64 mg/dL (calc)
Non-HDL Cholesterol (Calc): 85 mg/dL (calc) (ref <130)
Total CHOL/HDL Ratio: 3.3 (calc) (ref <5.0)
Triglycerides: 120 mg/dL (ref <150)

## 2020-03-31 LAB — T4, FREE: Free T4: 1.6 ng/dL (ref 0.8–1.8)

## 2020-03-31 LAB — TSH: TSH: 2.79 mIU/L (ref 0.40–4.50)

## 2020-03-31 LAB — VITAMIN D 25 HYDROXY (VIT D DEFICIENCY, FRACTURES): Vit D, 25-Hydroxy: 68 ng/mL (ref 30–100)

## 2020-04-02 ENCOUNTER — Ambulatory Visit (INDEPENDENT_AMBULATORY_CARE_PROVIDER_SITE_OTHER): Payer: Medicare Other | Admitting: Nurse Practitioner

## 2020-04-02 ENCOUNTER — Encounter: Payer: Self-pay | Admitting: Nurse Practitioner

## 2020-04-02 ENCOUNTER — Other Ambulatory Visit: Payer: Self-pay

## 2020-04-02 VITALS — BP 116/72 | HR 72 | Ht 72.0 in | Wt 317.0 lb

## 2020-04-02 DIAGNOSIS — E890 Postprocedural hypothyroidism: Secondary | ICD-10-CM

## 2020-04-02 DIAGNOSIS — I10 Essential (primary) hypertension: Secondary | ICD-10-CM | POA: Diagnosis not present

## 2020-04-02 DIAGNOSIS — E559 Vitamin D deficiency, unspecified: Secondary | ICD-10-CM

## 2020-04-02 DIAGNOSIS — E1165 Type 2 diabetes mellitus with hyperglycemia: Secondary | ICD-10-CM

## 2020-04-02 DIAGNOSIS — E782 Mixed hyperlipidemia: Secondary | ICD-10-CM | POA: Diagnosis not present

## 2020-04-02 NOTE — Patient Instructions (Signed)
Advice for Weight Management  -For most of us the best way to lose weight is by diet management. Generally speaking, diet management means consuming less calories intentionally which over time brings about progressive weight loss.  This can be achieved more effectively by restricting carbohydrate consumption to the minimum possible.  So, it is critically important to know your numbers: how much calorie you are consuming and how much calorie you need. More importantly, our carbohydrates sources should be unprocessed or minimally processed complex starch food items.   Sometimes, it is important to balance nutrition by increasing protein intake (animal or plant source), fruits, and vegetables.  -Sticking to a routine mealtime to eat 3 meals a day and avoiding unnecessary snacks is shown to have a big role in weight control. Under normal circumstances, the only time we lose real weight is when we are hungry, so allow hunger to take place- hunger means no food between meal times, only water.  It is not advisable to starve.   -It is better to avoid simple carbohydrates including: Cakes, Sweet Desserts, Ice Cream, Soda (diet and regular), Sweet Tea, Candies, Chips, Cookies, Store Bought Juices, Alcohol in Excess of  1-2 drinks a day, Artificial Sweeteners, Doughnuts, Coffee Creamers, "Sugar-free" Products, etc, etc.  This is not a complete list.....    -Consulting with certified diabetes educators is proven to provide you with the most accurate and current information on diet.  Also, you may be  interested in discussing diet options/exchanges , we can schedule a visit with Peter Dunlap, RDN, CDE for individualized nutrition education.  -Exercise: If you are able: 30 -60 minutes a day ,4 days a week, or 150 minutes a week.  The longer the better.  Combine stretch, strength, and aerobic activities.  If you were told in the past that you have high risk for cardiovascular diseases, you may seek evaluation by  your heart doctor prior to initiating moderate to intense exercise programs.    

## 2020-04-02 NOTE — Progress Notes (Signed)
04/02/2020                                                 Endocrinology Follow Up  Subjective:    Patient ID: Peter Dunlap, male    DOB: May 16, 1956, PCP [name], MD  Patient presents today for follow-up of hypothyroidism following radioactive iodine treatment, and type 2 diabetes.  Past Medical History:  Diagnosis Date  . Arteriosclerotic cardiovascular disease (ASCVD) 2005   catheterization in 10/2010:50% mid LAD, diffuse distal disease, circumflex irregularities, large dominant RCA with a 50% ostial, 70% distal, 60% posterolateral and 70% PDA; normal EF  . Arthritis   . Benign prostatic hypertrophy   . Bilateral carpal tunnel syndrome 07/03/2018  . Cerebrovascular disease 2010   R. carotid endarterectomy; Duplex in 10/2010-widely patent ICAs, subtotal left vertebral-not thought to be contributing to symptoms  . Cervical spine disease    CT in 2012-advanced degeneration and spondylosis with moderate spinal stenosis at C3-C6  . CHF (congestive heart failure) ([place])   . Depression   . Diabetes mellitus without complication ([place])   . Erectile dysfunction   . Gastroesophageal reflux disease   . H/O hiatal hernia   . H/O: substance abuse ([place])    Cocaine, marijuana, alcohol.  Quit 2013.   [name] Hyperlipidemia   . Hypertension   . Non-ST elevation myocardial infarction (NSTEMI), initial episode of care [facility]) 12/02/2013   DES LAD  . Obesity   . Prostate cancer ([place])   . Sleep apnea    CPAP  . Tachy-brady syndrome ([place])    a. s/p STJ dual chamber PPM   . Thyroid disease   . Tobacco abuse    Quit 2014  . 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 BRAIN SURGERY  2015   hematoma evacuation  . BURR HOLE Right 04/13/2014   Procedure: Haskell Flirt;  Surgeon: Charlie Pitter, MD;  Location: [place] NEURO ORS;  Service: Neurosurgery;  Laterality: Right;  . CAROTID ENDARTERECTOMY Right Feb. 25, 2010    CEA  . CORONARY  ANGIOPLASTY WITH STENT PLACEMENT  12/03/2013   LAD 90%-->0% W/ Promus Premier DES 3.0 mm x 16 mm, CFX OK, RCA 40%, EF 70-75%  . LEFT ATRIAL APPENDAGE OCCLUSION N/A 08/05/2015   Procedure: LEFT ATRIAL APPENDAGE OCCLUSION;  Surgeon: [name], MD;  Location: [facility] CV LAB;  Service: Cardiovascular;  Laterality: N/A;  . LEFT HEART CATHETERIZATION WITH CORONARY ANGIOGRAM Left 12/03/2013   Procedure: LEFT HEART CATHETERIZATION WITH CORONARY ANGIOGRAM;  Surgeon: Leonie Man, MD;  Location: [facility] CATH LAB;  Service: Cardiovascular;  Laterality: Left;  . LEFT HEART CATHETERIZATION WITH CORONARY ANGIOGRAM N/A 01/26/2014   Procedure: LEFT HEART CATHETERIZATION WITH CORONARY ANGIOGRAM;  Surgeon: Jettie Booze, MD;  Location: [facility] CATH LAB;  Service: Cardiovascular;  Laterality: N/A;  . LEFT HEART CATHETERIZATION WITH CORONARY ANGIOGRAM N/A 08/03/2014   Procedure: LEFT HEART CATHETERIZATION WITH CORONARY ANGIOGRAM;  Surgeon: Burnell Blanks, MD;  Location: [facility] CATH LAB;  Service: Cardiovascular;  Laterality: N/A;  . PERCUTANEOUS CORONARY STENT INTERVENTION (PCI-S)  12/03/2013   Procedure: PERCUTANEOUS CORONARY STENT INTERVENTION (PCI-S);  Surgeon: Leonie Man, MD;  Location: [facility] CATH LAB;  Service: Cardiovascular;;  . PERMANENT PACEMAKER INSERTION N/A 09/18/2014   Procedure: PERMANENT PACEMAKER INSERTION;  Surgeon: Marinus Maw, MD;  Location: [facility] CATH LAB;  Service: Cardiovascular;  Laterality: N/A;  . RADIOFREQUENCY ABLATION  2005   for PSVT  . TEE WITHOUT CARDIOVERSION N/A 07/27/2015   Procedure: TRANSESOPHAGEAL ECHOCARDIOGRAM (TEE);  Surgeon: Lewayne Bunting, MD;  Location: [facility] ENDOSCOPY;  Service: Cardiovascular;  Laterality: N/A;  . TEE WITHOUT CARDIOVERSION N/A 09/15/2015   Procedure: TRANSESOPHAGEAL ECHOCARDIOGRAM (TEE);  Surgeon: Vesta Mixer, MD;  Location: [facility] ENDOSCOPY;  Service: Cardiovascular;  Laterality: N/A;   Social History   Socioeconomic History  . Marital status:  Married    Spouse name: Not on file  . Number of children: 0  . Years of education: Not on file  . Highest education level: Not on file  Occupational History  . Occupation: Retired  Tobacco Use  . Smoking status: Former Smoker    Packs/day: 1.00    Years: 40.00    Pack years: 40.00    Types: Cigarettes    Start date: 10/20/1972    Quit date: 10/10/2012    Years since quitting: 7.4  . Smokeless tobacco: Never Used  . Tobacco comment: Quit in May.   Vaping Use  . Vaping Use: Never used  Substance and Sexual Activity  . Alcohol use: No    Alcohol/week: 0.0 standard drinks    Comment: former drinker-- sober since 2013.   . Drug use: No    Types: Cocaine    Comment: quit cocaine 10/2011  . Sexual activity: Yes    Partners: Female  Other Topics Concern  . Not on file  Social History Narrative   Lives in [place].   Social Determinants of Health   Financial Resource Strain:   . Difficulty of Paying Living Expenses: Not on file  Food Insecurity:   . Worried About Programme researcher, broadcasting/film/video in the Last Year: Not on file  . Ran Out of Food in the Last Year: Not on file  Transportation Needs:   . Lack of Transportation (Medical): Not on file  . Lack of Transportation (Non-Medical): Not on file  Physical Activity:   . Days of Exercise per Week: Not on file  . Minutes of Exercise per Session: Not on file  Stress:   . Feeling of Stress : Not on file  Social Connections:   . Frequency of Communication with Friends and Family: Not on file  . Frequency of Social Gatherings with Friends and Family: Not on file  . Attends Religious Services: Not on file  . Active Member of Clubs or Organizations: Not on file  . Attends Banker Meetings: Not on file  . Marital Status: Not on file   Outpatient Encounter Medications as of 04/02/2020  Medication Sig  . allopurinol (ZYLOPRIM) 100 MG tablet TAKE 1 TABLET(100 MG) BY MOUTH DAILY  . aspirin EC 325 MG tablet Take 1 tablet (325 mg  total) daily by mouth.  . Blood Glucose Monitoring Suppl (BLOOD GLUCOSE SYSTEM PAK) KIT Use as directed to monitor FSBS 1x daily. Dx: E11.9.  . Cyanocobalamin (VITAMIN B 12) 500 MCG TABS Take by mouth.  . DEXILANT 60 MG capsule TAKE 1 CAPSULE(60 MG) BY MOUTH DAILY  . furosemide (LASIX) 20 MG tablet TAKE 3 TABLETS BY MOUTH DAILY  . glipiZIDE (GLUCOTROL XL) 5 MG 24 hr tablet Take 1 tablet (5 mg total) by mouth daily with breakfast.  . isosorbide mononitrate (IMDUR) 60 MG 24 hr tablet TAKE 1 TABLET BY MOUTH DAILY  . levofloxacin (LEVAQUIN) 750 MG  tablet Take 1 tablet (750 mg total) by mouth daily.  [name] levothyroxine (SYNTHROID) 200 MCG tablet TAKE 1 TABLET(200 MCG) BY MOUTH DAILY BEFORE BREAKFAST  . loratadine (CLARITIN) 10 MG tablet Take 1 tablet (10 mg total) by mouth daily as needed for allergies.  . Magnesium Oxide 400 (240 Mg) MG TABS TAKE 1 TABLET BY MOUTH TWICE DAILY  . metFORMIN (GLUCOPHAGE) 500 MG tablet TAKE 1 TABLET BY MOUTH TWICE DAILY  . nitroGLYCERIN (NITROSTAT) 0.4 MG SL tablet PLACE 1 TABLET UNDER THE TONGUE EVERY 5 MINUTES AS NEEDED FOR CHEST PAIN. CALL 911 AT THIRD DOSE IN 15 MINUTES  . ONETOUCH ULTRA test strip USE TO TEST ONCE DAILY  . oxyCODONE-acetaminophen (PERCOCET) 10-325 MG tablet Take 1 tablet by mouth every 8 (eight) hours as needed for pain.  . potassium chloride SA (KLOR-CON) 20 MEQ tablet Take 1 tablet (20 mEq total) by mouth daily.  . silodosin (RAPAFLO) 8 MG CAPS capsule Take 1 capsule (8 mg total) by mouth 2 (two) times daily.  . simvastatin (ZOCOR) 40 MG tablet TAKE 1 TABLET(40 MG) BY MOUTH DAILY  . sotalol (BETAPACE) 160 MG tablet TAKE 1 TABLET(160 MG) BY MOUTH TWICE DAILY  . Vitamin D, Ergocalciferol, (DRISDOL) 1.25 MG (50000 UNIT) CAPS capsule TAKE 1 CAPSULE BY MOUTH EVERY 7 DAYS   No facility-administered encounter medications on file as of 04/02/2020.   ALLERGIES: Allergies  Allergen Reactions  . Trazodone And Nefazodone     Nightmares  . Lactose  Intolerance (Gi) Other (See Comments)    UPSET STOMACH    VACCINATION STATUS: Immunization History  Administered Date(s) Administered  . Influenza,inj,Quad PF,6+ Mos 02/17/2014, 03/09/2015, 03/10/2016, 03/27/2017, 03/20/2018, 03/04/2019, 03/15/2020  . Moderna SARS-COVID-2 Vaccination 08/03/2019, 09/06/2019  . Pneumococcal Polysaccharide-23 01/05/2014  . Tdap 12/19/2010  . Zoster Recombinat (Shingrix) 12/17/2019, 02/15/2020    Diabetes He presents for his follow-up diabetic visit. He has type 2 diabetes mellitus. His disease course has been worsening. There are no hypoglycemic associated symptoms. Pertinent negatives for hypoglycemia include no headaches or tremors. Pertinent negatives for diabetes include no chest pain, no fatigue, no foot paresthesias, no polydipsia and no polyuria. There are no hypoglycemic complications. Symptoms are worsening. Diabetic complications include heart disease and impotence. Risk factors for coronary artery disease include sedentary lifestyle, hypertension, diabetes mellitus, dyslipidemia, male sex, obesity and tobacco exposure. Current diabetic treatment includes oral agent (dual therapy). He is compliant with treatment most of the time. His weight is decreasing rapidly. He is following a generally unhealthy diet. When asked about meal planning, he reported none. He has not had a previous visit with a dietitian. He never participates in exercise. His home blood glucose trend is increasing steadily. (He presents today with no meter or logs to review.  He does monitor glucose regularly and says his recent readings have been higher than usual.  His previsit A1C was 7.4%, increasing from previous visit of 7%.  He admits to drinking sugary beverages.  He denies any episodes of hypoglycemia.) An ACE inhibitor/angiotensin II receptor blocker is not being taken. He does not see a podiatrist.Eye exam is not current.  Thyroid Problem Presents for follow-up (He was given RAI  therapy on March 16, 2014. - he denies family history of thyroid dysfunction .) visit. Patient reports no cold intolerance, constipation, diarrhea, fatigue, heat intolerance, palpitations or tremors. The symptoms have been stable.    Review of systems  Constitutional: + Minimally fluctuating body weight,  current Body mass index is 42.99 kg/m. ,  no fatigue, no subjective hyperthermia, no subjective hypothermia Eyes: no blurry vision, no xerophthalmia ENT: no sore throat, no nodules palpated in throat, no dysphagia/odynophagia, no hoarseness Cardiovascular: no chest pain, no shortness of breath, no palpitations, no leg swelling Respiratory: no cough, no shortness of breath Gastrointestinal: no nausea/vomiting/diarrhea Musculoskeletal: no muscle/joint aches Skin: no rashes, no hyperemia Neurological: no tremors, no numbness, no tingling, no dizziness Psychiatric: no depression, no anxiety  Objective:    BP 116/72   Pulse 72   Ht 6' (1.829 m)   Wt (!) 317 lb (143.8 kg)   BMI 42.99 kg/m   Wt Readings from Last 3 Encounters:  04/02/20 (!) 317 lb (143.8 kg)  03/15/20 (!) 316 lb (143.3 kg)  02/10/20 (!) 310 lb (140.6 kg)    BP Readings from Last 3 Encounters:  04/02/20 116/72  03/15/20 110/68  02/10/20 (!) 104/57     Physical Exam- Limited  Constitutional:  Body mass index is 42.99 kg/m. , not in acute distress, normal state of mind Eyes:  EOMI, no exophthalmos Neck: Supple Thyroid: No gross goiter Cardiovascular: RRR, no murmers, rubs, or gallops, no edema Respiratory: Adequate breathing efforts, no crackles, rales, rhonchi, or wheezing Musculoskeletal: no gross deformities, strength intact in all four extremities, no gross restriction of joint movements Skin:  no rashes, no hyperemia Neurological: no tremor with outstretched hands     Diabetic Labs (most recent): Lab Results  Component Value Date   HGBA1C 7.4 (H) 03/15/2020   HGBA1C 7.0 (A) 01/01/2020   HGBA1C  6.8 (H) 08/25/2019     Lipid Panel ( most recent) Lipid Panel     Component Value Date/Time   CHOL 122 03/30/2020 0735   TRIG 120 03/30/2020 0735   HDL 37 (L) 03/30/2020 0735   CHOLHDL 3.3 03/30/2020 0735   VLDL 28 10/17/2016 0819   LDLCALC 64 03/30/2020 0735    Recent Results (from the past 2160 hour(s))  Basic metabolic panel     Status: Abnormal   Collection Time: 01/15/20  8:49 AM  Result Value Ref Range   Glucose 147 (H) 65 - 99 mg/dL   BUN 11 8 - 27 mg/dL   Creatinine, Ser 1.22 0.76 - 1.27 mg/dL   GFR calc non Af Amer 62 >59 mL/min/1.73   GFR calc Af Amer 72 >59 mL/min/1.73    Comment: **[facility] currently reports eGFR in compliance with the current**   recommendations of the [employer]. [facility] will   update reporting as new guidelines are published from the NKF-ASN   Task force.    BUN/Creatinine Ratio 9 (L) 10 - 24   Sodium 137 134 - 144 mmol/L   Potassium 4.1 3.5 - 5.2 mmol/L   Chloride 98 96 - 106 mmol/L   CO2 26 20 - 29 mmol/L   Calcium 9.4 8.6 - 10.2 mg/dL  Magnesium     Status: None   Collection Time: 01/15/20  8:49 AM  Result Value Ref Range   Magnesium 1.9 1.6 - 2.3 mg/dL  CUP PACEART REMOTE DEVICE CHECK     Status: None   Collection Time: 02/11/20  2:00 AM  Result Value Ref Range   Date Time Interrogation Session [id number]    Pulse Generator Manufacturer [employer]    Pulse Gen Model 2240 Assurity    Pulse Gen Serial Number [id number]    Clinic Name [facility] Pulse Generator Type Implantable Pulse Generator    Implantable Pulse Generator Implant Date [id number]  Implantable Lead Manufacturer [facility]    Implantable Lead Model 1688TC Tendril SDX    Implantable Lead Serial Number [id number]    Implantable Lead Implant Date [id number]    Implantable Lead Location Detail 1 UNKNOWN    Implantable Lead Location [phone]    Implantable Lead Manufacturer [facility]    Implantable Lead Model 1888TC Tendril ST Optim    Implantable Lead  Serial Number [id number]    Implantable Lead Implant Date [id number]    Implantable Lead Location Detail 1 UNKNOWN    Implantable Lead Location [phone]    Lead Channel Setting Sensing Sensitivity 2.0 mV   Lead Channel Setting Sensing Adaptation Mode Fixed Pacing    Lead Channel Setting Pacing Amplitude 2.0 V   Lead Channel Setting Pacing Pulse Width 0.5 ms   Lead Channel Setting Pacing Amplitude 2.5 V   Lead Channel Status NULL    Lead Channel Impedance Value 430 ohm   Lead Channel Sensing Intrinsic Amplitude 4.5 mV   Lead Channel Pacing Threshold Amplitude 0.5 V   Lead Channel Pacing Threshold Pulse Width 0.5 ms   Lead Channel Status NULL    Lead Channel Impedance Value 590 ohm   Lead Channel Sensing Intrinsic Amplitude 12.0 mV   Lead Channel Pacing Threshold Amplitude 0.75 V   Lead Channel Pacing Threshold Pulse Width 0.5 ms   Battery Status MOS    Battery Remaining Longevity 128 mo   Battery Remaining Percentage 95.5 %   Battery Voltage 2.98 V   Brady Statistic RA Percent Paced 6.3 %   Brady Statistic RV Percent Paced 1.0 %   Brady Statistic AP VP Percent 1.0 %   Brady Statistic AS VP Percent 1.0 %   Brady Statistic AP VS Percent 6.5 %   Brady Statistic AS VS Percent 93.0 %  I-STAT creatinine     Status: None   Collection Time: 03/09/20 10:34 AM  Result Value Ref Range   Creatinine, Ser 1.10 0.61 - 1.24 mg/dL  VITAMIN D 25 Hydroxy (Vit-D Deficiency, Fractures)     Status: None   Collection Time: 03/15/20 11:42 AM  Result Value Ref Range   Vit D, 25-Hydroxy 74 30 - 100 ng/mL    Comment: Vitamin D Status         25-OH Vitamin D: . Deficiency:                    <20 ng/mL Insufficiency:             20 - 29 ng/mL Optimal:                 > or = 30 ng/mL . For 25-OH Vitamin D testing on patients on  D2-supplementation and patients for whom quantitation  of D2 and D3 fractions is required, the QuestAssureD(TM) 25-OH VIT D, (D2,D3), LC/MS/MS is recommended: order  code [id number]  (patients >66yrs). See Note 1 . Note 1 . For additional information, please refer to  http://education.[facility].com/faq/FAQ199  (This link is being provided for informational/ educational purposes only.)   CBC with Differential/Platelet     Status: Abnormal   Collection Time: 03/15/20 11:42 AM  Result Value Ref Range   WBC 6.6 3.8 - 10.8 Thousand/uL   RBC 4.62 4.20 - 5.80 Million/uL   Hemoglobin 15.5 13.2 - 17.1 g/dL   HCT 44.5 38 - 50 %   MCV 96.3 80.0 - 100.0 fL   MCH 33.5 (H) 27.0 - 33.0 pg   MCHC 34.8 32.0 -  36.0 g/dL   RDW 12.5 11.0 - 15.0 %   Platelets 117 (L) 140 - 400 Thousand/uL   MPV 12.1 7.5 - 12.5 fL   Neutro Abs 3,722 1,500 - 7,800 cells/uL   Lymphs Abs 2,138 850 - 3,900 cells/uL   Absolute Monocytes 495 200 - 950 cells/uL   Eosinophils Absolute 132 15.0 - 500.0 cells/uL   Basophils Absolute 112 0.0 - 200.0 cells/uL   Neutrophils Relative % 56.4 %   Total Lymphocyte 32.4 %   Monocytes Relative 7.5 %   Eosinophils Relative 2.0 %   Basophils Relative 1.7 %  Comprehensive metabolic panel     Status: Abnormal   Collection Time: 03/15/20 11:42 AM  Result Value Ref Range   Glucose, Bld 141 (H) 65 - 99 mg/dL    Comment: .            Fasting reference interval . For someone without known diabetes, a glucose value >125 mg/dL indicates that they may have diabetes and this should be confirmed with a follow-up test. .    BUN 10 7 - 25 mg/dL   Creat 1.17 0.70 - 1.25 mg/dL    Comment: For patients >27 years of age, the reference limit for Creatinine is approximately 13% higher for people identified as African-American. .    BUN/Creatinine Ratio NOT APPLICABLE 6 - 22 (calc)   Sodium 137 135 - 146 mmol/L   Potassium 4.4 3.5 - 5.3 mmol/L   Chloride 102 98 - 110 mmol/L   CO2 25 20 - 32 mmol/L   Calcium 9.3 8.6 - 10.3 mg/dL   Total Protein 7.6 6.1 - 8.1 g/dL   Albumin 3.9 3.6 - 5.1 g/dL   Globulin 3.7 1.9 - 3.7 g/dL (calc)   AG Ratio 1.1 1.0 - 2.5 (calc)    Total Bilirubin 1.3 (H) 0.2 - 1.2 mg/dL   Alkaline phosphatase (APISO) 85 35 - 144 U/L   AST 42 (H) 10 - 35 U/L   ALT 23 9 - 46 U/L  Microalbumin / creatinine urine ratio     Status: None   Collection Time: 03/15/20 11:42 AM  Result Value Ref Range   Creatinine, Urine 206 20 - 320 mg/dL   Microalb, Ur 1.3 mg/dL    Comment: Reference Range Not established    Microalb Creat Ratio 6 <30 mcg/mg creat    Comment: . The ADA defines abnormalities in albumin excretion as follows: [name] Albuminuria Category        Result (mcg/mg creatinine) . Normal to Mildly increased   <30 Moderately increased         30-299  Severely increased           > OR = 300 . The ADA recommends that at least two of three specimens collected within a 3-6 month period be abnormal before considering a patient to be within a diagnostic category.   Lipid panel     Status: Abnormal   Collection Time: 03/15/20 11:42 AM  Result Value Ref Range   Cholesterol 136 <200 mg/dL   HDL 37 (L) > OR = 40 mg/dL   Triglycerides 170 (H) <150 mg/dL   LDL Cholesterol (Calc) 74 mg/dL (calc)    Comment: Reference range: <100 . Desirable range <100 mg/dL for primary prevention;   <70 mg/dL for patients with CHD or diabetic patients  with > or = 2 CHD risk factors. [name] LDL-C is now calculated using the Martin-Hopkins  calculation, which is a validated novel method  providing  better accuracy than the Friedewald equation in the  estimation of LDL-C.  Cresenciano Genre et al. Annamaria Helling. 4481;856(31): 2061-2068  (http://education.[facility].com/faq/FAQ164)    Total CHOL/HDL Ratio 3.7 <5.0 (calc)   Non-HDL Cholesterol (Calc) 99 <130 mg/dL (calc)    Comment: For patients with diabetes plus 1 major ASCVD risk  factor, treating to a non-HDL-C goal of <100 mg/dL  (LDL-C of <70 mg/dL) is considered a therapeutic  option.   Hemoglobin A1c     Status: Abnormal   Collection Time: 03/15/20 11:42 AM  Result Value Ref Range   Hgb A1c MFr Bld  7.4 (H) <5.7 % of total Hgb    Comment: For someone without known diabetes, a hemoglobin A1c value of 6.5% or greater indicates that they may have  diabetes and this should be confirmed with a follow-up  test. . For someone with known diabetes, a value <7% indicates  that their diabetes is well controlled and a value  greater than or equal to 7% indicates suboptimal  control. A1c targets should be individualized based on  duration of diabetes, age, comorbid conditions, and  other considerations. . Currently, no consensus exists regarding use of hemoglobin A1c for diagnosis of diabetes for children. .    Mean Plasma Glucose 166 (calc)   eAG (mmol/L) 9.2 (calc)  Lipid panel     Status: Abnormal   Collection Time: 03/30/20  7:35 AM  Result Value Ref Range   Cholesterol 122 <200 mg/dL   HDL 37 (L) > OR = 40 mg/dL   Triglycerides 120 <150 mg/dL   LDL Cholesterol (Calc) 64 mg/dL (calc)    Comment: Reference range: <100 . Desirable range <100 mg/dL for primary prevention;   <70 mg/dL for patients with CHD or diabetic patients  with > or = 2 CHD risk factors. [name] LDL-C is now calculated using the Martin-Hopkins  calculation, which is a validated novel method providing  better accuracy than the Friedewald equation in the  estimation of LDL-C.  Cresenciano Genre et al. Annamaria Helling. 4970;263(78): 2061-2068  (http://education.[facility].com/faq/FAQ164)    Total CHOL/HDL Ratio 3.3 <5.0 (calc)   Non-HDL Cholesterol (Calc) 85 <130 mg/dL (calc)    Comment: For patients with diabetes plus 1 major ASCVD risk  factor, treating to a non-HDL-C goal of <100 mg/dL  (LDL-C of <70 mg/dL) is considered a therapeutic  option.   COMPLETE METABOLIC PANEL WITH GFR     Status: Abnormal   Collection Time: 03/30/20  7:35 AM  Result Value Ref Range   Glucose, Bld 136 (H) 65 - 99 mg/dL    Comment: .            Fasting reference interval . For someone without known diabetes, a glucose value >125 mg/dL  indicates that they may have diabetes and this should be confirmed with a follow-up test. .    BUN 10 7 - 25 mg/dL   Creat 1.04 0.70 - 1.25 mg/dL    Comment: For patients >31 years of age, the reference limit for Creatinine is approximately 13% higher for people identified as African-American. .    GFR, Est Non African American 76 > OR = 60 mL/min/1.71m2   GFR, Est African American 88 > OR = 60 mL/min/1.31m2   BUN/Creatinine Ratio NOT APPLICABLE 6 - 22 (calc)   Sodium 139 135 - 146 mmol/L   Potassium 4.0 3.5 - 5.3 mmol/L   Chloride 104 98 - 110 mmol/L   CO2 24 20 - 32 mmol/L  Calcium 9.3 8.6 - 10.3 mg/dL   Total Protein 7.3 6.1 - 8.1 g/dL   Albumin 3.8 3.6 - 5.1 g/dL   Globulin 3.5 1.9 - 3.7 g/dL (calc)   AG Ratio 1.1 1.0 - 2.5 (calc)   Total Bilirubin 1.2 0.2 - 1.2 mg/dL   Alkaline phosphatase (APISO) 74 35 - 144 U/L   AST 27 10 - 35 U/L   ALT 13 9 - 46 U/L  VITAMIN D 25 Hydroxy (Vit-D Deficiency, Fractures)     Status: None   Collection Time: 03/30/20  7:35 AM  Result Value Ref Range   Vit D, 25-Hydroxy 68 30 - 100 ng/mL    Comment: Vitamin D Status         25-OH Vitamin D: . Deficiency:                    <20 ng/mL Insufficiency:             20 - 29 ng/mL Optimal:                 > or = 30 ng/mL . For 25-OH Vitamin D testing on patients on  D2-supplementation and patients for whom quantitation  of D2 and D3 fractions is required, the QuestAssureD(TM) 25-OH VIT D, (D2,D3), LC/MS/MS is recommended: order  code [id number] (patients >40yrs). See Note 1 . Note 1 . 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 Patient has had problems with consistency of taking medications. Therefore given the normal free T4, advised him to continue at same dose of levothyroxine at 200 mcg p.o. every morning before breakfast. Repeat thyroid function test will be done prior to next visit.   - We discussed about the correct intake of his thyroid hormone, on empty stomach at fasting, with water, separated by at least 30 minutes from breakfast and other medications,  and separated by more than 4 hours from calcium, iron, multivitamins, acid reflux medications (PPIs). -Patient is made aware of the fact that thyroid hormone replacement is needed for life, dose to be adjusted by periodic monitoring of thyroid function tests.   2. Diabetes mellitus type 2 in obese [facility])  -He presents today with no meter or logs to review.  He does monitor glucose regularly and says his recent readings have been higher than usual.  His previsit A1C was 7.4%, increasing from previous visit of 7%.  He admits to drinking sugary beverages.  He denies any episodes of hypoglycemia.   -Given the lack of logs and meter to review today, only slight changes will be made to his medications. He is advised to continue Metformin 500 mg p.o. twice daily with meals, and increase his Glipizide to 5 mg XL daily with breakfast.   -He is advised to monitor blood glucose at least one time a day, before breakfast, and as needed throughout the day.    -  He will be considered for incretin therapy at next visit.  - Nutritional counseling repeated at each appointment due to patients tendency to fall back in to old habits.  - The patient admits there is a room for improvement in their diet and drink choices. -  Suggestion is made for the patient to avoid simple carbohydrates from their diet including Cakes,  Sweet Desserts / Pastries, Ice Cream, Soda (diet and regular), Sweet Tea, Candies, Chips, Cookies, Sweet Pastries,  Store Bought Juices, Alcohol in Excess of  1-2 drinks a day, Artificial Sweeteners, Coffee Creamer, and "Sugar-free" Products. This will help patient to have stable blood glucose profile and potentially avoid unintended weight gain.   - I encouraged the patient to switch to  unprocessed or minimally processed complex starch and increased protein intake (animal or plant source), fruits, and vegetables.   - Patient is advised to stick to a routine mealtimes to eat 3 meals  a day and avoid unnecessary snacks ( to snack only to correct hypoglycemia).  3. Essential hypertension His blood pressure is controlled to target.  He is advised to continue Lasix 60 mg po daily.  4.  Hyperlipidemia:  His most recent lipid panel from 03/30/20 shows controlled LDL at 64.  He is advised to continue Simvastatin 40 mg po daily at bedtime.  Side effects and precautions discussed with him.  5. Vitamin D Deficiency: His most recent vitamin D level on 03/30/20 shows improvement to 68.  He has completed replenishment with ergocalciferol.  No need for additional supplementation at this time.   - I advised patient to maintain close follow up with [name], MD for primary care needs.   - Time spent on this patient care encounter:  30 min, of which > 50% was spent in  counseling and the rest reviewing his blood glucose logs , discussing his hypoglycemia and hyperglycemia episodes, reviewing his current and  previous labs / studies  ( including abstraction from other facilities) and medications  doses and developing a  long term treatment plan and documenting his care.   Please refer to Patient Instructions for Blood Glucose Monitoring and Insulin/Medications Dosing Guide"  in media tab for additional information. Please  also refer to " Patient Self Inventory" in the Media  tab for reviewed elements of  pertinent patient history.  [name] participated in the discussions, expressed understanding, and voiced agreement with the above plans.  All questions were answered to his satisfaction. he is encouraged to contact clinic should he have any questions or concerns prior to his return visit.  Follow up plan: Return in about 4 months (around 08/02/2020) for Diabetes follow up with A1c in office, Thyroid follow up, No previsit labs.  Rayetta Pigg, FNP-BC [facility] Phone: [phone] Fax: [phone] .  04/02/2020, 9:00 AM

## 2020-04-06 ENCOUNTER — Other Ambulatory Visit: Payer: Self-pay | Admitting: *Deleted

## 2020-04-06 DIAGNOSIS — E559 Vitamin D deficiency, unspecified: Secondary | ICD-10-CM

## 2020-04-06 DIAGNOSIS — E782 Mixed hyperlipidemia: Secondary | ICD-10-CM

## 2020-04-06 DIAGNOSIS — I5032 Chronic diastolic (congestive) heart failure: Secondary | ICD-10-CM

## 2020-04-06 DIAGNOSIS — I10 Essential (primary) hypertension: Secondary | ICD-10-CM

## 2020-04-06 DIAGNOSIS — G894 Chronic pain syndrome: Secondary | ICD-10-CM

## 2020-04-06 DIAGNOSIS — G4733 Obstructive sleep apnea (adult) (pediatric): Secondary | ICD-10-CM

## 2020-04-06 DIAGNOSIS — E1149 Type 2 diabetes mellitus with other diabetic neurological complication: Secondary | ICD-10-CM

## 2020-04-06 DIAGNOSIS — E890 Postprocedural hypothyroidism: Secondary | ICD-10-CM

## 2020-04-06 DIAGNOSIS — D696 Thrombocytopenia, unspecified: Secondary | ICD-10-CM

## 2020-04-09 DIAGNOSIS — H0012 Chalazion right lower eyelid: Secondary | ICD-10-CM | POA: Diagnosis not present

## 2020-04-09 DIAGNOSIS — H00025 Hordeolum internum left lower eyelid: Secondary | ICD-10-CM | POA: Diagnosis not present

## 2020-04-12 ENCOUNTER — Ambulatory Visit (HOSPITAL_COMMUNITY)
Admission: RE | Admit: 2020-04-12 | Discharge: 2020-04-12 | Disposition: A | Discharge status: Home / Self Care | Payer: Medicare Other | Source: Ambulatory Visit | Attending: Urology | Admitting: Urology

## 2020-04-12 ENCOUNTER — Other Ambulatory Visit: Payer: Self-pay

## 2020-04-12 ENCOUNTER — Other Ambulatory Visit: Payer: Self-pay | Admitting: Family Medicine

## 2020-04-12 ENCOUNTER — Other Ambulatory Visit: Payer: Self-pay | Admitting: Urology

## 2020-04-12 DIAGNOSIS — C61 Malignant neoplasm of prostate: Secondary | ICD-10-CM

## 2020-04-12 DIAGNOSIS — R590 Localized enlarged lymph nodes: Secondary | ICD-10-CM | POA: Diagnosis not present

## 2020-04-12 MED ORDER — PIFLIFOLASTAT F 18 (PYLARIFY) INJECTION
9.0000 | Freq: Once | INTRAVENOUS | Status: AC
  Administered 2020-04-12: 9.23 via INTRAVENOUS

## 2020-04-12 MED ORDER — FLUDEOXYGLUCOSE F - 18 (FDG) INJECTION: 9.2300 | Freq: Once | INTRAVENOUS | Status: DC | PRN

## 2020-04-13 ENCOUNTER — Telehealth: Payer: Self-pay

## 2020-04-13 NOTE — Telephone Encounter (Signed)
-----   Message from Franchot Gallo, MD sent at 04/12/2020  7:37 PM EST ----- I called w/ positive result--see order for MedOnc consult. Also needs appt w/ me next open spot ----- Message ----- From: Valentina Lucks, LPN Sent: 76/12/108   3:48 PM EST To: Franchot Gallo, MD  Pls. Review.

## 2020-04-13 NOTE — Progress Notes (Signed)
Pt has an appt for 05/02/20.

## 2020-04-13 NOTE — Telephone Encounter (Signed)
appt scheduled with wife and appt mailed.

## 2020-04-16 ENCOUNTER — Encounter (HOSPITAL_COMMUNITY): Payer: Self-pay

## 2020-04-16 NOTE — Progress Notes (Signed)
I placed an introductory phone call to this patient today. Patient was unavailable put I spoke with the patient's wife. I introduced myself and explained my role in the patient's care. I provided my contact information and encouraged them to call with questions or concerns.

## 2020-04-18 ENCOUNTER — Other Ambulatory Visit: Payer: Self-pay | Admitting: Family Medicine

## 2020-04-19 ENCOUNTER — Encounter (HOSPITAL_COMMUNITY): Payer: Self-pay | Admitting: Hematology

## 2020-04-19 ENCOUNTER — Inpatient Hospital Stay (HOSPITAL_COMMUNITY): Payer: Medicare Other | Attending: Hematology | Admitting: Hematology

## 2020-04-19 ENCOUNTER — Inpatient Hospital Stay (HOSPITAL_COMMUNITY): Payer: Medicare Other

## 2020-04-19 ENCOUNTER — Other Ambulatory Visit (HOSPITAL_COMMUNITY): Payer: Self-pay | Admitting: Hematology

## 2020-04-19 ENCOUNTER — Telehealth (HOSPITAL_COMMUNITY): Payer: Self-pay | Admitting: Pharmacy Technician

## 2020-04-19 ENCOUNTER — Other Ambulatory Visit: Payer: Self-pay

## 2020-04-19 VITALS — BP 139/67 | HR 63 | Temp 97.0°F | Resp 18 | Ht 70.5 in | Wt 315.3 lb

## 2020-04-19 DIAGNOSIS — M2550 Pain in unspecified joint: Secondary | ICD-10-CM

## 2020-04-19 DIAGNOSIS — R0602 Shortness of breath: Secondary | ICD-10-CM

## 2020-04-19 DIAGNOSIS — C61 Malignant neoplasm of prostate: Secondary | ICD-10-CM | POA: Diagnosis present

## 2020-04-19 DIAGNOSIS — Z801 Family history of malignant neoplasm of trachea, bronchus and lung: Secondary | ICD-10-CM

## 2020-04-19 DIAGNOSIS — Z191 Hormone sensitive malignancy status: Secondary | ICD-10-CM

## 2020-04-19 DIAGNOSIS — Z8249 Family history of ischemic heart disease and other diseases of the circulatory system: Secondary | ICD-10-CM | POA: Diagnosis not present

## 2020-04-19 DIAGNOSIS — Z803 Family history of malignant neoplasm of breast: Secondary | ICD-10-CM

## 2020-04-19 DIAGNOSIS — E039 Hypothyroidism, unspecified: Secondary | ICD-10-CM

## 2020-04-19 DIAGNOSIS — I495 Sick sinus syndrome: Secondary | ICD-10-CM

## 2020-04-19 DIAGNOSIS — E1165 Type 2 diabetes mellitus with hyperglycemia: Secondary | ICD-10-CM | POA: Diagnosis not present

## 2020-04-19 DIAGNOSIS — Z7984 Long term (current) use of oral hypoglycemic drugs: Secondary | ICD-10-CM

## 2020-04-19 DIAGNOSIS — R42 Dizziness and giddiness: Secondary | ICD-10-CM | POA: Diagnosis not present

## 2020-04-19 DIAGNOSIS — Z833 Family history of diabetes mellitus: Secondary | ICD-10-CM | POA: Diagnosis not present

## 2020-04-19 DIAGNOSIS — Z87891 Personal history of nicotine dependence: Secondary | ICD-10-CM | POA: Diagnosis not present

## 2020-04-19 DIAGNOSIS — R519 Headache, unspecified: Secondary | ICD-10-CM

## 2020-04-19 DIAGNOSIS — G479 Sleep disorder, unspecified: Secondary | ICD-10-CM | POA: Diagnosis not present

## 2020-04-19 DIAGNOSIS — Z806 Family history of leukemia: Secondary | ICD-10-CM

## 2020-04-19 DIAGNOSIS — Z79899 Other long term (current) drug therapy: Secondary | ICD-10-CM

## 2020-04-19 DIAGNOSIS — I5032 Chronic diastolic (congestive) heart failure: Secondary | ICD-10-CM | POA: Diagnosis not present

## 2020-04-19 DIAGNOSIS — C778 Secondary and unspecified malignant neoplasm of lymph nodes of multiple regions: Secondary | ICD-10-CM | POA: Diagnosis not present

## 2020-04-19 DIAGNOSIS — Z5111 Encounter for antineoplastic chemotherapy: Secondary | ICD-10-CM | POA: Diagnosis not present

## 2020-04-19 DIAGNOSIS — E669 Obesity, unspecified: Secondary | ICD-10-CM

## 2020-04-19 DIAGNOSIS — E785 Hyperlipidemia, unspecified: Secondary | ICD-10-CM

## 2020-04-19 LAB — PSA: Prostatic Specific Antigen: 1.55 ng/mL (ref 0.00–4.00)

## 2020-04-19 LAB — HEMOGLOBIN A1C
Hgb A1c MFr Bld: 7 % — ABNORMAL HIGH (ref 4.8–5.6)
Mean Plasma Glucose: 154.2 mg/dL

## 2020-04-19 MED ORDER — APALUTAMIDE 60 MG PO TABS: 240.0000 mg | ORAL_TABLET | Freq: Every day | ORAL | 0 refills | Status: DC

## 2020-04-19 NOTE — Patient Instructions (Addendum)
[place] at [facility] Discharge Instructions  You were seen and examined today by Dr. Delton Coombes. Dr. Delton Coombes is a medical oncologist, meaning he specializes in the medical management of cancer diagnoses. Dr. Delton Coombes discussed your past medical history, family history of cancer and the events that led to you being here today.  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 The cancer has spread to your lymph nodes, which means that we cannot cure your cancer. You will start on Apalutamide, that is a pill that you will take daily to control the cancer. You will also receive injections, Lupron every 6 months.  We will bring you back to receive the initial injection once we have insurance approval.  We will see you back in the clinic two weeks after beginning Apalutamide.  Starting Apalutamide: Take 2 pills daily for the first week, if you are tolerating it well then increase to 4 pills daily.   Thank you for choosing [place] [facility] to provide your oncology and hematology care.  To afford each patient quality time with our provider, please arrive at least 15 minutes before your scheduled appointment time.   If you have a lab appointment with the [facility] please come in thru the Main Entrance and check in at the main information desk.  You need to re-schedule your appointment should you arrive 10 or more minutes late.  We strive to give you quality time with our providers, and arriving late affects you and other patients whose appointments are after yours.  Also, if you no show three or more times for appointments you may be dismissed from the clinic at the providers discretion.     Again, thank you for choosing [facility].  Our hope is that these requests will decrease the amount of time that you wait before being seen by our physicians.        _____________________________________________________________  Should you have questions after your visit to [facility], please contact our office at [phone] and follow the prompts.  Our office hours are 8:00 a.m. and 4:30 p.m. Monday - Friday.  Please note that voicemails left after 4:00 p.m. may not be returned until the following business day.  We are closed weekends and major holidays.  You do have access to a nurse 24-7, just call the main number to the clinic [phone] and do not press any options, hold on the line and a nurse will answer the phone.    For prescription refill requests, have your pharmacy contact our office and allow 72 hours.    Due to Covid, you will need to wear a mask upon entering the hospital. If you do not have a mask, a mask will be given to you at the Main Entrance upon arrival. For doctor visits, patients may have 1 support person age 79 or older with them. For treatment visits, patients can not have anyone with them due to social distancing guidelines and our immunocompromised population.

## 2020-04-19 NOTE — Telephone Encounter (Signed)
Oral Oncology Patient Advocate Encounter   Received notification from [employer] that prior authorization for [place] is required.   PA submitted on CoverMyMeds Key [id number] Status is pending   Oral Oncology Clinic will continue to follow.  [place] [facility] [place] Phone [phone] Fax [phone] 04/20/2020 9:08 AM

## 2020-04-19 NOTE — Progress Notes (Signed)
Apalutamide 240mg  daily order placed and sent to [facility] per Dr. Delton Coombes.

## 2020-04-19 NOTE — Progress Notes (Signed)
[place] [address], [place] [address]   CLINIC:  Medical Oncology/Hematology  CONSULT NOTE  Patient Care Team: [place], [name], MD as PCP - General (Family Medicine) [name], MD as PCP - Cardiology (Cardiology) [name], MD (Cardiology) Early, Arvilla Meres, MD as Attending Physician (Vascular Surgery) Cassandria Anger, MD as Consulting Physician (Endocrinology) [name], MD as Consulting Physician (Cardiology) Edythe Clarity, [facility] as Pharmacist (Pharmacist) Dishmon, Garwin Brothers, RN as Oncology Nurse Navigator (Oncology)  CHIEF COMPLAINTS/PURPOSE OF CONSULTATION:  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 Franchot Gallo.  Today he is accompanied by his sister and he reports feeling okay. He denies ever receiving Lupron shots after being diagnosed with prostate cancer and only received radiation. He reports having hip pain in both hips. He plays golf occasionally and is able to do his ADL's. He denies having any boils or skin infections on his back side. He has lost about 5 lbs in the past 6 months but denies hot flashes. His energy levels are not good and he reports that it is a recent change. He takes metformin for his DM and reports that his fasting BG this AM was in the 300's. He has a history of 3 MI's and had stents placed on two occasions as well as a pacemaker. He had a cranial hematoma evacuated after he fell in his bathtub while on Coumadin for his pacemaker in 2015.  He lives at home with his wife. He quit smoking in 2015 after smoking 1 PPD for 40 years. He used to work in Architect. His sister has leukemia; maternal GM had breast cancer; maternal uncle had lung cancer; maternal cousin had tongue cancer and another maternal cousin had  lung cancer.  He will see Dr. Diona Fanti on 12/28. He will see Dr. Dorris Fetch on 2/28.  MEDICAL HISTORY:  Past Medical History:  Diagnosis Date  . Arteriosclerotic cardiovascular disease (ASCVD) 2005   catheterization in 10/2010:50% mid LAD, diffuse distal disease, circumflex irregularities, large dominant RCA with a 50% ostial, 70% distal, 60% posterolateral and 70% PDA; normal EF  . Arthritis   . Benign prostatic hypertrophy   . Bilateral carpal tunnel syndrome 07/03/2018  . Cerebrovascular disease 2010   R. carotid endarterectomy; Duplex in 10/2010-widely patent ICAs, subtotal left vertebral-not thought to be contributing to symptoms  . Cervical spine disease    CT in 2012-advanced degeneration and spondylosis with moderate spinal stenosis at C3-C6  . CHF (congestive heart failure) ([place])   . Depression   . Diabetes mellitus without complication ([place])   . Erectile dysfunction   . Gastroesophageal reflux disease   . H/O hiatal hernia   . H/O: substance abuse ([place])    Cocaine, marijuana, alcohol.  Quit 2013.   [name] Hyperlipidemia   . Hypertension   . Non-ST elevation myocardial infarction (NSTEMI), initial episode of care [facility]) 12/02/2013   DES LAD  . Obesity   . Prostate cancer ([place])   . Sleep apnea    CPAP  . Tachy-brady syndrome ([place])    a. s/p STJ dual chamber PPM   . Thyroid disease   . Tobacco abuse    Quit 2014  . Ulnar neuropathy at elbow 07/03/2018   Bilateral    SURGICAL HISTORY: Past Surgical History:  Procedure Laterality  Date  . BRAIN SURGERY  2015   hematoma evacuation  . BURR HOLE Right 04/13/2014   Procedure: Ezekiel Ina;  Surgeon: Temple Pacini, MD;  Location: [facility] NEURO ORS;  Service: Neurosurgery;  Laterality: Right;  . CAROTID ENDARTERECTOMY Right Feb. 25, 2010    CEA  . CORONARY ANGIOPLASTY WITH STENT PLACEMENT  12/03/2013   LAD 90%-->0% W/ Promus Premier DES 3.0 mm x 16 mm, CFX OK, RCA 40%, EF 70-75%  . LEFT ATRIAL APPENDAGE OCCLUSION N/A 08/05/2015   Procedure:  LEFT ATRIAL APPENDAGE OCCLUSION;  Surgeon: [name], MD;  Location: [facility] INVASIVE CV LAB;  Service: Cardiovascular;  Laterality: N/A;  . LEFT HEART CATHETERIZATION WITH CORONARY ANGIOGRAM Left 12/03/2013   Procedure: LEFT HEART CATHETERIZATION WITH CORONARY ANGIOGRAM;  Surgeon: Marykay Lex, MD;  Location: [facility] CATH LAB;  Service: Cardiovascular;  Laterality: Left;  . LEFT HEART CATHETERIZATION WITH CORONARY ANGIOGRAM N/A 01/26/2014   Procedure: LEFT HEART CATHETERIZATION WITH CORONARY ANGIOGRAM;  Surgeon: Corky Crafts, MD;  Location: [facility] C[facility] LAB;  Service: Cardiovascular;  Laterality: N/A;  . LEFT HEART CATHETERIZATION WITH CORONARY ANGIOGRAM N/A 08/03/2014   Procedure: LEFT HEART CATHETERIZATION WITH CORONARY ANGIOGRAM;  Surgeon: Kathleene Hazel, MD;  Location: [facility] CATH LAB;  Service: Cardiovascular;  Laterality: N/A;  . PERCUTANEOUS CORONARY STENT INTERVENTION (PCI-S)  12/03/2013   Procedure: PERCUTANEOUS CORONARY STENT INTERVENTION (PCI-S);  Surgeon: Marykay Lex, MD;  Location: [facility] CATH LAB;  Service: Cardiovascular;;  . PERMANENT PACEMAKER INSERTION N/A 09/18/2014   Procedure: PERMANENT PACEMAKER INSERTION;  Surgeon: Marinus Maw, MD;  Location: [facility] CATH LAB;  Service: Cardiovascular;  Laterality: N/A;  . RADIOFREQUENCY ABLATION  2005   for PSVT  . TEE WITHOUT CARDIOVERSION N/A 07/27/2015   Procedure: TRANSESOPHAGEAL ECHOCARDIOGRAM (TEE);  Surgeon: Lewayne Bunting, MD;  Location: [facility] ENDOSCOPY;  Service: Cardiovascular;  Laterality: N/A;  . TEE WITHOUT CARDIOVERSION N/A 09/15/2015   Procedure: TRANSESOPHAGEAL ECHOCARDIOGRAM (TEE);  Surgeon: Vesta Mixer, MD;  Location: [facility] ENDOSCOPY;  Service: Cardiovascular;  Laterality: N/A;    SOCIAL HISTORY: Social History   Socioeconomic History  . Marital status: Married    Spouse name: Not on file  . Number of children: 0  . Years of education: Not on file  . Highest education level: Not on file  Occupational History  .  Occupation: Retired  Tobacco Use  . Smoking status: Former Smoker    Packs/day: 1.00    Years: 40.00    Pack years: 40.00    Types: Cigarettes    Start date: 10/20/1972    Quit date: 10/10/2012    Years since quitting: 7.5  . Smokeless tobacco: Never Used  . Tobacco comment: Quit in May.   Vaping Use  . Vaping Use: Never used  Substance and Sexual Activity  . Alcohol use: Yes    Alcohol/week: 0.0 standard drinks    Comment: former drinker-- sober since 2013.   . Drug use: No    Types: Cocaine    Comment: quit cocaine 10/2011  . Sexual activity: Yes    Partners: Female  Other Topics Concern  . Not on file  Social History Narrative   Lives in [place].   Social Determinants of Health   Financial Resource Strain: Low Risk   . Difficulty of Paying Living Expenses: Not hard at all  Food Insecurity: No Food Insecurity  . Worried About Programme researcher, broadcasting/film/video in the Last Year: Never true  . Ran Out of Food in the  Last Year: Never true  Transportation Needs: No Transportation Needs  . Lack of Transportation (Medical): No  . Lack of Transportation (Non-Medical): No  Physical Activity: Inactive  . Days of Exercise per Week: 0 days  . Minutes of Exercise per Session: 0 min  Stress: No Stress Concern Present  . Feeling of Stress : Not at all  Social Connections: Moderately Isolated  . Frequency of Communication with Friends and Family: More than three times a week  . Frequency of Social Gatherings with Friends and Family: Once a week  . Attends Religious Services: Never  . Active Member of Clubs or Organizations: No  . Attends Banker Meetings: Never  . Marital Status: Married  Catering manager Violence: Not At Risk  . Fear of Current or Ex-Partner: No  . Emotionally Abused: No  . Physically Abused: No  . Sexually Abused: No    FAMILY HISTORY: Family History  Problem Relation Age of Onset  . Hypertension Mother        Cerebrovascular disease  . Diabetes  Mother   . Coronary artery disease Father 52  . Diabetes type II Father   . Hypertension Father   . Heart attack Father   . Diabetes Brother   . Hypertension Brother   . Lung cancer Paternal Uncle   . Diabetes Sister   . Hypertension Sister   . Heart attack Sister 58  . Cancer Sister        leukemia  . Cancer Maternal Uncle        breast  . Cancer Maternal Grandmother        breast    ALLERGIES:  is allergic to trazodone and nefazodone and lactose intolerance (gi).  MEDICATIONS:  Current Outpatient Medications  Medication Sig Dispense Refill  . allopurinol (ZYLOPRIM) 100 MG tablet TAKE 1 TABLET(100 MG) BY MOUTH DAILY 90 tablet 0  . aspirin EC 325 MG tablet Take 1 tablet (325 mg total) daily by mouth. 30 tablet 0  . Blood Glucose Monitoring Suppl (BLOOD GLUCOSE SYSTEM PAK) KIT Use as directed to monitor FSBS 1x daily. Dx: E11.9. 1 kit 1  . Cyanocobalamin (VITAMIN B 12) 500 MCG TABS Take by mouth.    . DEXILANT 60 MG capsule TAKE 1 CAPSULE(60 MG) BY MOUTH DAILY 30 capsule 5  . furosemide (LASIX) 20 MG tablet TAKE 3 TABLETS BY MOUTH DAILY 270 tablet 3  . glipiZIDE (GLUCOTROL XL) 5 MG 24 hr tablet Take 1 tablet (5 mg total) by mouth daily with breakfast. 90 tablet 0  . isosorbide mononitrate (IMDUR) 60 MG 24 hr tablet TAKE 1 TABLET BY MOUTH DAILY 90 tablet 3  . levothyroxine (SYNTHROID) 200 MCG tablet TAKE 1 TABLET(200 MCG) BY MOUTH DAILY BEFORE BREAKFAST 90 tablet 0  . loratadine (CLARITIN) 10 MG tablet TAKE 1 TABLET(10 MG) BY MOUTH DAILY AS NEEDED FOR ALLERGIES 30 tablet 2  . Magnesium Oxide 400 (240 Mg) MG TABS TAKE 1 TABLET BY MOUTH TWICE DAILY 60 tablet 8  . metFORMIN (GLUCOPHAGE) 500 MG tablet TAKE 1 TABLET BY MOUTH TWICE DAILY 180 tablet 0  . ofloxacin (OCUFLOX) 0.3 % ophthalmic solution Place 1 drop into the left eye 4 times daily for 10 days.    Letta Pate ULTRA test strip USE TO TEST ONCE DAILY    . oxyCODONE-acetaminophen (PERCOCET) 10-325 MG tablet Take 1 tablet by  mouth every 8 (eight) hours as needed for pain. 90 tablet 0  . potassium chloride SA (KLOR-CON) 20 MEQ tablet  Take 1 tablet (20 mEq total) by mouth daily. 90 tablet 3  . silodosin (RAPAFLO) 8 MG CAPS capsule Take 1 capsule (8 mg total) by mouth 2 (two) times daily. 60 capsule 11  . simvastatin (ZOCOR) 40 MG tablet TAKE 1 TABLET(40 MG) BY MOUTH DAILY 90 tablet 1  . sotalol (BETAPACE) 160 MG tablet TAKE 1 TABLET(160 MG) BY MOUTH TWICE DAILY 180 tablet 3  . Vitamin D, Ergocalciferol, (DRISDOL) 1.25 MG (50000 UNIT) CAPS capsule TAKE 1 CAPSULE BY MOUTH EVERY 7 DAYS 12 capsule 0  . nitroGLYCERIN (NITROSTAT) 0.4 MG SL tablet PLACE 1 TABLET UNDER THE TONGUE EVERY 5 MINUTES AS NEEDED FOR CHEST PAIN. CALL 911 AT THIRD DOSE IN 15 MINUTES (Patient not taking: Reported on 04/19/2020) 25 tablet 1   No current facility-administered medications for this visit.    REVIEW OF SYSTEMS:   Review of Systems  Constitutional: Positive for appetite change (60%), fatigue (25%) and unexpected weight change (lost 5 lbs in 6 months).  HENT:   Positive for trouble swallowing (d/t teeth).   Respiratory: Positive for shortness of breath.   Endocrine: Negative for hot flashes.  Genitourinary: Positive for difficulty urinating and frequency.   Musculoskeletal: Positive for arthralgias (bilat hip pain) and back pain (9/10 back pain).  Neurological: Positive for dizziness (occasional), headaches (occasional) and numbness (hands & feet).  Psychiatric/Behavioral: Positive for sleep disturbance.  All other systems reviewed and are negative.    PHYSICAL EXAMINATION: ECOG PERFORMANCE STATUS: 1 - Symptomatic but completely ambulatory  Vitals:   04/19/20 1322  BP: 139/67  Pulse: 63  Resp: 18  Temp: (!) 97 F (36.1 C)  SpO2: 98%   Filed Weights   04/19/20 1322  Weight: (!) 315 lb 4.8 oz (143 kg)   Physical Exam Vitals reviewed.  Constitutional:      Appearance: Normal appearance. He is obese.  Cardiovascular:      Rate and Rhythm: Normal rate and regular rhythm.     Pulses: Normal pulses.     Heart sounds: Normal heart sounds.  Pulmonary:     Effort: Pulmonary effort is normal.     Breath sounds: Normal breath sounds.  Abdominal:     Palpations: Abdomen is soft. There is no hepatomegaly, splenomegaly or mass.     Tenderness: There is no abdominal tenderness.     Hernia: No hernia is present.  Neurological:     General: No focal deficit present.     Mental Status: He is alert and oriented to person, place, and time.  Psychiatric:        Mood and Affect: Mood normal.        Behavior: Behavior normal.      LABORATORY DATA:  I have reviewed the data as listed CBC Latest Ref Rng & Units 03/15/2020 08/25/2019 05/16/2019  WBC 3.8 - 10.8 Thousand/uL 6.6 6.0 6.8  Hemoglobin 13.2 - 17.1 g/dL 81.8 29.9 37.1  Hematocrit 38 - 50 % 44.5 43.3 46.4  Platelets 140 - 400 Thousand/uL 117(L) 129(L) 139(L)   CMP Latest Ref Rng & Units 03/30/2020 03/15/2020 03/09/2020  Glucose 65 - 99 mg/dL 696(V) 893(Y) -  BUN 7 - 25 mg/dL 10 10 -  Creatinine 1.01 - 1.25 mg/dL 7.51 0.25 8.52  Sodium 135 - 146 mmol/L 139 137 -  Potassium 3.5 - 5.3 mmol/L 4.0 4.4 -  Chloride 98 - 110 mmol/L 104 102 -  CO2 20 - 32 mmol/L 24 25 -  Calcium 8.6 - 10.3 mg/dL 9.3  9.3 -  Total Protein 6.1 - 8.1 g/dL 7.3 7.6 -  Total Bilirubin 0.2 - 1.2 mg/dL 1.2 1.3(H) -  Alkaline Phos 40 - 115 U/L - - -  AST 10 - 35 U/L 27 42(H) -  ALT 9 - 46 U/L 13 23 -   Lab Results  Component Value Date   VD25OH 68 03/30/2020   VD25OH 74 03/15/2020   VD25OH 21 (L) 08/25/2019    RADIOGRAPHIC STUDIES: I have personally reviewed the radiological images as listed and agreed with the findings in the report. NM PET (F18-PYLARIFY) SKULL TO MID THIGH  Result Date: 04/12/2020 CLINICAL DATA:  Prostate carcinoma with biochemical recurrence. Radiation therapy 2018. PSA equal 1.4 on 11/10/2019 EXAM: NUCLEAR MEDICINE PET SKULL BASE TO THIGH TECHNIQUE: 9.23 mCi  F-18 Fluciclovine was injected intravenously. Full-ring PET imaging was performed from the skull base to thigh after the radiotracer. CT data was obtained and used for attenuation correction and anatomic localization. COMPARISON:  CT 03/09/2020 bone scan 02/25/2020 FINDINGS: NECK No radiotracer activity in neck lymph nodes. Incidental CT finding: None CHEST No radiotracer accumulation within mediastinal or hilar lymph nodes. No suspicious pulmonary nodules on the CT scan. Incidental CT finding: None ABDOMEN/PELVIS Prostate: No clear abnormal activity in the prostate gland. Fiducial markers noted Lymph nodes: Radiotracer activity associated with mildly enlarged LEFT external iliac lymph node measuring 9 mm on image 176 with SUV max equal 7.6. Similar intense activity within a small LEFT common iliac lymph node measuring only 6 mm short axis (image 159) with SUV max equal 8.6. More superiorly at the level of the renal veins there is a LEFT periaortic lymph node measuring 5 mm with SUV max equal 6.3. Lymph node position between the IVC and the aorta at the level RIGHT renal hilum (image 138) with SUV max equal 8.6. Lymph node deep to the IVC measuring 6 mm on image 135 with SUV max equal 5.4. These lymph nodes are slightly enlarged from comparison CT 02/17/2016 Liver: No evidence of liver metastasis Incidental CT finding: None SKELETON No focal activity to suggest skeletal metastasis. No sclerotic lesions IMPRESSION: 1. Radiotracer activity associated with the LEFT external and common iliac lymph nodes most consistent with local prostate cancer nodal metastasis within the pelvis. 2. Cluster of small lymph nodes in the retroperitoneum about the aorta and IVC at the level the renal veins. While these nodes are minimally changed in size from prior there is measurable radiotracer activity and concerning for periaortic nodal metastasis. 3. No skeletal metastasis or visceral metastasis. Electronically Signed   By: Suzy Bouchard M.D.   On: 04/12/2020 15:16    ASSESSMENT:  1.  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 -Status post IMRT, 40 sessions completed on 06/14/2016 by Dr. Tammi Klippel. -PSA 0.7 (10/15/2018), 0.8 (02/18/2019), 1.4 (December 2020), 1.4 (June 2021) -Prostate biopsy on 02/10/2020-1 out of 12 cores from left mid lateral region positive for adenocarcinoma. -CTAP on 03/09/2020 with newly enlarged left iliac lymph nodes measuring 1.1 x 0.9 cm.  Hepatic steatosis with early signs of cirrhosis. -F-18 PSMA PET scan on 04/12/2020 showed radiotracer activity associated with enlarged left external iliac lymph node 9 mm with SUV 7.6.  Small left common iliac lymph node 6 mm, SUV 8.6.  Left periaortic lymph node 5 mm, SUV 6.3.  Lymph node between IVC and aorta at the level of right  renal hilum, SUV 8.6.  Lymph node deep to the IVC measuring 6 mm, SUV 5.4.  No skeletal metastasis.  2.  Social/family history: -He is a retired Nature conservation officer.  He lives at home with his wife.  He plays golf.  He quit smoking in 2015, smoked 1 pack/day for more than 20 years. -Sister has CML.  Maternal grandmother had breast cancer.  Maternal uncle had lung cancer.  2 maternal cousins had tongue cancer and uterine cancer.   PLAN:  1.  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 -We discussed normal prognosis of metastatic castration sensitive prostate cancer.  In general lymph node only metastatic disease has a very good prognosis. -Reviewed various options including Abiraterone, enzalutamide, apalutamide and docetaxel.  As he does not have high-volume disease, did not recommend chemotherapy.  He is not a candidate for  Abiraterone as he has poorly controlled diabetes.  He had a previous intracranial bleed while on Coumadin. -Hence I recommended apalutamide even though there is slight increase in cardiovascular side effects. -Patient had MI in the past and has a pacemaker. -Full dose of apalutamide is 240 mg once daily.  We will start him on 120 mg for 1 week and increase it to 240 mg if he tolerates well. -We reviewed adverse effects including hypertension, fluid retention, hot flashes, fatigue, hyperkalemia, endocrine abnormalities, common GI side effects.  We will closely monitor. -I have also recommended GnRH agonist/antagonist treatment.  We will start him on degarelix.  We will likely continue degarelix as cardiovascular disease is slightly low compared to Lupron. -Recommend genetic testing to evaluate for BRCA 1/2 as well as other mismatch repair mutations. -We will also consider NGS testing on the biopsy specimen in the future. -RTC 2 weeks after starting apalutamide.  2.  Poorly controlled diabetes: -He follows up with Dr. Dorris Fetch. -He is on Metformin 500 mg twice daily.  He is also on glipizide 5 mg daily with breakfast. -We will closely monitor his sugars as apalutamide can increase the blood sugars.    Derek Jack, MD, 04/19/20 2:37 PM  [place] [phone]   I, Milinda Antis, am acting as a scribe for Dr. Sanda Linger.  I, Derek Jack MD, have reviewed the above documentation for accuracy and completeness, and I agree with the above.

## 2020-04-20 ENCOUNTER — Telehealth (HOSPITAL_COMMUNITY): Payer: Self-pay | Admitting: Pharmacist

## 2020-04-20 ENCOUNTER — Ambulatory Visit (HOSPITAL_COMMUNITY): Payer: Medicare Other | Admitting: Hematology

## 2020-04-20 LAB — TESTOSTERONE: Testosterone: 297 ng/dL (ref 264–916)

## 2020-04-20 NOTE — Telephone Encounter (Signed)
Oral Oncology Patient Advocate Encounter  Prior Authorization for Peter Dunlap has been approved.    PA# [id number] Effective dates: 04/20/20 through 06/04/21  Patients co-pay is $0.00  Oral Oncology Clinic will continue to follow.   [place] Patient [place] Phone [phone] Fax [phone] 04/20/2020 9:09 AM

## 2020-04-20 NOTE — Telephone Encounter (Signed)
Oral Oncology Pharmacist Encounter  Received new prescription for Erleada (apalutamide) for the treatment of metastatic prostate cancer castration sensitive in conjunction with degarelix, planned duration until disease progression or unacceptable drug toxicity.  Prescription dose and frequency assessed. MD plans to start him on 120 mg (2 tablets) for 1 week and increase it to 240 mg (4 tablets) if tolerated.  Current medication list in Epic reviewed, several DDIs with apalutamide identified: -Dexlansoprazole: apalutamide may decrease the concentration of dexlansoprazole. Category X interaction. If a PPI is required for this patient recommend whiching to pantoprazole with which there is no drug-drug interaction. Messaged Dr. Buelah Manis (PCP) about this DDI. -Levothyroxine: apalutamide may decrease the effect of thyroid products. Monitor patient for decreased levothyroxine effectiveness. -Oxycodone: apalutamide may decrease concentrations of oxycodone. Monitor patient's pain control.  Evaluated chart and no patient barriers to medication adherence identified.   Written consent obtained on 04/19/20.  Prescription has been e-scribed to the [facility] for benefits analysis and approval.  Oral Oncology Clinic will continue to follow for insurance authorization, copayment issues, initial counseling and start date.  Darl Pikes, PharmD, BCPS, BCOP, CPP Hematology/Oncology Clinical Pharmacist Practitioner [facility] [place] [facility] [phone]  04/20/2020 10:47 AM

## 2020-04-21 ENCOUNTER — Encounter (HOSPITAL_COMMUNITY): Payer: Self-pay | Admitting: *Deleted

## 2020-04-21 ENCOUNTER — Other Ambulatory Visit: Payer: Self-pay

## 2020-04-21 ENCOUNTER — Telehealth: Payer: Self-pay | Admitting: Family Medicine

## 2020-04-21 ENCOUNTER — Inpatient Hospital Stay (HOSPITAL_COMMUNITY): Payer: Medicare Other

## 2020-04-21 VITALS — BP 131/64 | HR 64 | Temp 96.7°F | Resp 18

## 2020-04-21 DIAGNOSIS — Z8249 Family history of ischemic heart disease and other diseases of the circulatory system: Secondary | ICD-10-CM | POA: Diagnosis not present

## 2020-04-21 DIAGNOSIS — I5032 Chronic diastolic (congestive) heart failure: Secondary | ICD-10-CM | POA: Diagnosis not present

## 2020-04-21 DIAGNOSIS — R519 Headache, unspecified: Secondary | ICD-10-CM | POA: Diagnosis not present

## 2020-04-21 DIAGNOSIS — R0602 Shortness of breath: Secondary | ICD-10-CM | POA: Diagnosis not present

## 2020-04-21 DIAGNOSIS — C61 Malignant neoplasm of prostate: Secondary | ICD-10-CM

## 2020-04-21 DIAGNOSIS — C778 Secondary and unspecified malignant neoplasm of lymph nodes of multiple regions: Secondary | ICD-10-CM | POA: Diagnosis not present

## 2020-04-21 DIAGNOSIS — Z801 Family history of malignant neoplasm of trachea, bronchus and lung: Secondary | ICD-10-CM | POA: Diagnosis not present

## 2020-04-21 DIAGNOSIS — Z87891 Personal history of nicotine dependence: Secondary | ICD-10-CM | POA: Diagnosis not present

## 2020-04-21 DIAGNOSIS — Z5111 Encounter for antineoplastic chemotherapy: Secondary | ICD-10-CM | POA: Diagnosis not present

## 2020-04-21 DIAGNOSIS — Z191 Hormone sensitive malignancy status: Secondary | ICD-10-CM

## 2020-04-21 DIAGNOSIS — M2550 Pain in unspecified joint: Secondary | ICD-10-CM | POA: Diagnosis not present

## 2020-04-21 DIAGNOSIS — E039 Hypothyroidism, unspecified: Secondary | ICD-10-CM | POA: Diagnosis not present

## 2020-04-21 DIAGNOSIS — G479 Sleep disorder, unspecified: Secondary | ICD-10-CM | POA: Diagnosis not present

## 2020-04-21 DIAGNOSIS — E1165 Type 2 diabetes mellitus with hyperglycemia: Secondary | ICD-10-CM | POA: Diagnosis not present

## 2020-04-21 DIAGNOSIS — Z7984 Long term (current) use of oral hypoglycemic drugs: Secondary | ICD-10-CM | POA: Diagnosis not present

## 2020-04-21 DIAGNOSIS — R42 Dizziness and giddiness: Secondary | ICD-10-CM | POA: Diagnosis not present

## 2020-04-21 DIAGNOSIS — Z833 Family history of diabetes mellitus: Secondary | ICD-10-CM | POA: Diagnosis not present

## 2020-04-21 DIAGNOSIS — Z803 Family history of malignant neoplasm of breast: Secondary | ICD-10-CM | POA: Diagnosis not present

## 2020-04-21 DIAGNOSIS — Z806 Family history of leukemia: Secondary | ICD-10-CM | POA: Diagnosis not present

## 2020-04-21 DIAGNOSIS — I495 Sick sinus syndrome: Secondary | ICD-10-CM | POA: Diagnosis not present

## 2020-04-21 DIAGNOSIS — E785 Hyperlipidemia, unspecified: Secondary | ICD-10-CM | POA: Diagnosis not present

## 2020-04-21 DIAGNOSIS — Z79899 Other long term (current) drug therapy: Secondary | ICD-10-CM | POA: Diagnosis not present

## 2020-04-21 MED ORDER — DEGARELIX ACETATE(240 MG DOSE) 120 MG/VIAL ~~LOC~~ SOLR
240.0000 mg | Freq: Once | SUBCUTANEOUS | Status: AC
  Administered 2020-04-21: 240 mg via SUBCUTANEOUS
  Filled 2020-04-21: qty 6

## 2020-04-21 MED ORDER — PANTOPRAZOLE SODIUM 40 MG PO TBEC: 40.0000 mg | DELAYED_RELEASE_TABLET | Freq: Every day | ORAL | 3 refills | Status: DC

## 2020-04-21 MED FILL — ERLEADA 60 MG TABS: 60 | 30 days supply | Qty: 120 | Fill #0

## 2020-04-21 NOTE — Telephone Encounter (Signed)
Call placed to patient wife Raquel Sarna and she was made aware. Will notify patient when he returns home.

## 2020-04-21 NOTE — Telephone Encounter (Signed)
Call pt due to interaction with his cancer chemo drugs we need to [place] He will have to take protonix until he finishes with Chemotherapy I have sent to pharmacy   Dont throw dexilant away, once done he can resume it

## 2020-04-21 NOTE — Telephone Encounter (Signed)
Oral Oncology Patient Advocate Encounter  I spoke with [name] this morning to set up delivery of Erleada.  Address verified for shipment.  Peter Dunlap will be filled through [facility] and mailed 04/21/20 for delivery 04/22/20.    Peter Dunlap will call 7-10 days before next refill is due to complete adherence call and set up delivery of medication.     [place] Patient [place] Phone [phone] Fax [phone] 04/21/2020 9:40 AM

## 2020-04-21 NOTE — Telephone Encounter (Signed)
-----   Message from Darl Pikes, [place] sent at 04/20/2020 11:12 AM EST ----- Regarding: Dexlansoprazole drug interaction Dr. Bridgette Habermann morning! I am a oral chemotherapy pharmacist working with Dr. Delton Coombes. Dr. Delton Coombes is planning on starting this patient on Erleada (apalutamide) for his prostate cancer. In reviewing his medication list for drug- drug interactions with apalutamide, I found that there was an interaction with the dexlansoprazole. I wanted to let you know about the interaction as the dexlansoprazole prescriber.   Apalutamide may decrease the concentration of dexlansoprazole, category X interaction (avoid combination). If a PPI is required for this patient you could switch him to pantoprazole, if appropriate, with which there is no apalutamide drug-drug interaction.  If you have any questions, feel free to reach out!  -Anthoney Harada, PharmD, BCPS, BCOP, CPP Hematology/Oncology Clinical Pharmacist Practitioner [facility] [place] [facility] [phone]

## 2020-04-21 NOTE — Progress Notes (Signed)
Patient tolerated Firmagon injection with no complaints voiced.  Site clean and dry with no bruising or swelling noted.  No complaints of pain.  Discharged with vital signs stable and no signs or symptoms of distress noted.   

## 2020-04-28 ENCOUNTER — Other Ambulatory Visit: Payer: Self-pay | Admitting: *Deleted

## 2020-04-28 MED ORDER — OXYCODONE-ACETAMINOPHEN 10-325 MG PO TABS: 1.0000 | ORAL_TABLET | Freq: Three times a day (TID) | ORAL | 0 refills | Status: DC | PRN

## 2020-04-28 NOTE — Telephone Encounter (Signed)
Received call from patient.   Requested refill on Oxycodone/APAP.   Ok to refill??  Last office visit 04/21/2020.  Last refill 03/15/2020.

## 2020-05-03 ENCOUNTER — Encounter (HOSPITAL_COMMUNITY): Payer: Self-pay

## 2020-05-03 NOTE — Progress Notes (Signed)
This RN was notified today that patient's genetic testing lab kit drawn on 04/19/20 was not packaged and shipped per protocol. I have attempted to reach the patient, but unable to do so. VM left asking that the patient return my call. 

## 2020-05-10 ENCOUNTER — Ambulatory Visit (HOSPITAL_COMMUNITY): Payer: Medicare Other | Admitting: Oncology

## 2020-05-12 ENCOUNTER — Other Ambulatory Visit: Payer: Self-pay | Admitting: Family Medicine

## 2020-05-12 ENCOUNTER — Ambulatory Visit (INDEPENDENT_AMBULATORY_CARE_PROVIDER_SITE_OTHER): Payer: Medicare Other

## 2020-05-12 DIAGNOSIS — I495 Sick sinus syndrome: Secondary | ICD-10-CM

## 2020-05-12 LAB — CUP PACEART REMOTE DEVICE CHECK
Battery Remaining Longevity: 120 mo
Battery Remaining Percentage: 95.5 %
Battery Voltage: 2.96 V
Brady Statistic AP VP Percent: 1 %
Brady Statistic AP VS Percent: 7.5 %
Brady Statistic AS VP Percent: 1 %
Brady Statistic AS VS Percent: 92 %
Brady Statistic RA Percent Paced: 7.5 %
Brady Statistic RV Percent Paced: 1 %
Date Time Interrogation Session: 20211208080019
Implantable Lead Implant Date: 20160415
Implantable Lead Implant Date: 20160415
Implantable Lead Location: 753859
Implantable Lead Location: 753860
Implantable Pulse Generator Implant Date: 20160415
Lead Channel Impedance Value: 410 Ohm
Lead Channel Impedance Value: 550 Ohm
Lead Channel Pacing Threshold Amplitude: 0.5 V
Lead Channel Pacing Threshold Amplitude: 0.75 V
Lead Channel Pacing Threshold Pulse Width: 0.5 ms
Lead Channel Pacing Threshold Pulse Width: 0.5 ms
Lead Channel Sensing Intrinsic Amplitude: 12 mV
Lead Channel Sensing Intrinsic Amplitude: 4.7 mV
Lead Channel Setting Pacing Amplitude: 2 V
Lead Channel Setting Pacing Amplitude: 2.5 V
Lead Channel Setting Pacing Pulse Width: 0.5 ms
Lead Channel Setting Sensing Sensitivity: 2 mV
Pulse Gen Model: 2240
Pulse Gen Serial Number: 7756161

## 2020-05-13 ENCOUNTER — Inpatient Hospital Stay (HOSPITAL_COMMUNITY): Payer: Medicare Other

## 2020-05-13 ENCOUNTER — Telehealth: Payer: Self-pay

## 2020-05-13 ENCOUNTER — Encounter (HOSPITAL_COMMUNITY): Payer: Self-pay | Admitting: Genetic Counselor

## 2020-05-13 ENCOUNTER — Inpatient Hospital Stay (HOSPITAL_BASED_OUTPATIENT_CLINIC_OR_DEPARTMENT_OTHER): Payer: Medicare Other | Admitting: Hematology and Oncology

## 2020-05-13 ENCOUNTER — Other Ambulatory Visit: Payer: Self-pay

## 2020-05-13 ENCOUNTER — Inpatient Hospital Stay (HOSPITAL_COMMUNITY): Payer: Medicare Other | Attending: Hematology | Admitting: Genetic Counselor

## 2020-05-13 DIAGNOSIS — Z191 Hormone sensitive malignancy status: Secondary | ICD-10-CM

## 2020-05-13 DIAGNOSIS — Z803 Family history of malignant neoplasm of breast: Secondary | ICD-10-CM | POA: Diagnosis not present

## 2020-05-13 DIAGNOSIS — Z808 Family history of malignant neoplasm of other organs or systems: Secondary | ICD-10-CM | POA: Insufficient documentation

## 2020-05-13 DIAGNOSIS — Z87891 Personal history of nicotine dependence: Secondary | ICD-10-CM | POA: Insufficient documentation

## 2020-05-13 DIAGNOSIS — Z8041 Family history of malignant neoplasm of ovary: Secondary | ICD-10-CM

## 2020-05-13 DIAGNOSIS — C61 Malignant neoplasm of prostate: Secondary | ICD-10-CM

## 2020-05-13 DIAGNOSIS — Z800 Family history of malignant neoplasm of digestive organs: Secondary | ICD-10-CM

## 2020-05-13 DIAGNOSIS — Z801 Family history of malignant neoplasm of trachea, bronchus and lung: Secondary | ICD-10-CM

## 2020-05-13 DIAGNOSIS — E119 Type 2 diabetes mellitus without complications: Secondary | ICD-10-CM | POA: Diagnosis not present

## 2020-05-13 DIAGNOSIS — C778 Secondary and unspecified malignant neoplasm of lymph nodes of multiple regions: Secondary | ICD-10-CM | POA: Diagnosis not present

## 2020-05-13 DIAGNOSIS — Z806 Family history of leukemia: Secondary | ICD-10-CM | POA: Diagnosis not present

## 2020-05-13 DIAGNOSIS — K760 Fatty (change of) liver, not elsewhere classified: Secondary | ICD-10-CM | POA: Diagnosis not present

## 2020-05-13 DIAGNOSIS — Z79899 Other long term (current) drug therapy: Secondary | ICD-10-CM | POA: Insufficient documentation

## 2020-05-13 DIAGNOSIS — Z7984 Long term (current) use of oral hypoglycemic drugs: Secondary | ICD-10-CM | POA: Insufficient documentation

## 2020-05-13 DIAGNOSIS — Z5111 Encounter for antineoplastic chemotherapy: Secondary | ICD-10-CM | POA: Insufficient documentation

## 2020-05-13 DIAGNOSIS — Z8049 Family history of malignant neoplasm of other genital organs: Secondary | ICD-10-CM | POA: Diagnosis not present

## 2020-05-13 LAB — COMPREHENSIVE METABOLIC PANEL
ALT: 16 U/L (ref 0–44)
AST: 32 U/L (ref 15–41)
Albumin: 3.8 g/dL (ref 3.5–5.0)
Alkaline Phosphatase: 66 U/L (ref 38–126)
Anion gap: 9 (ref 5–15)
BUN: 14 mg/dL (ref 8–23)
CO2: 23 mmol/L (ref 22–32)
Calcium: 9 mg/dL (ref 8.9–10.3)
Chloride: 104 mmol/L (ref 98–111)
Creatinine, Ser: 1.08 mg/dL (ref 0.61–1.24)
GFR, Estimated: >60 mL/min (ref >60)
Glucose, Bld: 111 mg/dL — ABNORMAL HIGH (ref 70–99)
Potassium: 4.1 mmol/L (ref 3.5–5.1)
Sodium: 136 mmol/L (ref 135–145)
Total Bilirubin: 1.1 mg/dL (ref 0.3–1.2)
Total Protein: 7.8 g/dL (ref 6.5–8.1)

## 2020-05-13 LAB — CBC WITH DIFFERENTIAL/PLATELET
Abs Immature Granulocytes: 0.01 10*3/uL (ref 0.00–0.07)
Basophils Absolute: 0.1 10*3/uL (ref 0.0–0.1)
Basophils Relative: 1 %
Eosinophils Absolute: 0.1 10*3/uL (ref 0.0–0.5)
Eosinophils Relative: 2 %
HCT: 43.7 % (ref 39.0–52.0)
Hemoglobin: 14.8 g/dL (ref 13.0–17.0)
Immature Granulocytes: 0 %
Lymphocytes Relative: 39 %
Lymphs Abs: 2 10*3/uL (ref 0.7–4.0)
MCH: 33.9 pg (ref 26.0–34.0)
MCHC: 33.9 g/dL (ref 30.0–36.0)
MCV: 100.2 fL — ABNORMAL HIGH (ref 80.0–100.0)
Monocytes Absolute: 0.5 10*3/uL (ref 0.1–1.0)
Monocytes Relative: 9 %
Neutro Abs: 2.6 10*3/uL (ref 1.7–7.7)
Neutrophils Relative %: 49 %
Platelets: 116 10*3/uL — ABNORMAL LOW (ref 150–400)
RBC: 4.36 MIL/uL (ref 4.22–5.81)
RDW: 13.5 % (ref 11.5–15.5)
WBC: 5.2 10*3/uL (ref 4.0–10.5)
nRBC: 0 % (ref 0.0–0.2)

## 2020-05-13 LAB — PSA: Prostatic Specific Antigen: 0.26 ng/mL (ref 0.00–4.00)

## 2020-05-13 NOTE — Progress Notes (Signed)
REFERRING PROVIDER: Katragadda, Sreedhar, MD [address] ,  [place] [address]  PRIMARY PROVIDER:  Cloverleaf, Kawanta F, MD  PRIMARY REASON FOR VISIT:  1. 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 Family history of CML (chronic monocytic leukemia)   3. Family history of lung cancer   4. Family history of breast cancer   5. Family history of ovarian cancer   6. Family history of stomach cancer   7. Family history of cancer of mouth      I connected with Peter Dunlap on 05/13/2020 at 9:00 am EDT by video conference and verified that I am speaking with the correct person using two identifiers.   Patient location: [facility] Provider location: [facility]  HISTORY OF PRESENT ILLNESS:   Peter Dunlap, a 64 y.o. male, was seen for a [place] cancer genetics consultation at the request of Dr. Katragadda due to a personal and family history of cancer.  Peter Dunlap presents to clinic today to discuss the possibility of a hereditary predisposition to cancer, genetic testing, and to further clarify his future cancer risks, as well as potential cancer risks for family members.   In 2017, at the age of 60, Peter Dunlap was diagnosed with prostate cancer (Gleason 4+3=7). Since then, his cancer has been identified to be metastatic after PET scan on 04/12/2020. His current treatment includes Degarelix with apalutamide (started 04/21/2020).    Past Medical History:  Diagnosis Date  . Arteriosclerotic cardiovascular disease (ASCVD) 2005   catheterization in 10/2010:50% mid LAD, diffuse distal disease, circumflex irregularities, large dominant RCA with a 50% ostial, 70% distal, 60% posterolateral and 70% PDA; normal EF  . Arthritis   . Benign prostatic hypertrophy   . Bilateral carpal tunnel syndrome 07/03/2018  . Cerebrovascular disease 2010   R. carotid endarterectomy; Duplex in 10/2010-widely patent ICAs, subtotal left vertebral-not thought to be  contributing to symptoms  . Cervical spine disease    CT in 2012-advanced degeneration and spondylosis with moderate spinal stenosis at C3-C6  . CHF (congestive heart failure) (HCC)   . Depression   . Diabetes mellitus without complication (HCC)   . Erectile dysfunction   . Family history of breast cancer   . Family history of cancer of mouth   . Family history of CML (chronic monocytic leukemia)   . Family history of lung cancer   . Family history of ovarian cancer   . Family history of stomach cancer   . Gastroesophageal reflux disease   . H/O hiatal hernia   . H/O: substance abuse (HCC)    Cocaine, marijuana, alcohol.  Quit 2013.   . Hyperlipidemia   . Hypertension   . Non-ST elevation myocardial infarction (NSTEMI), initial episode of care (HCC) 12/02/2013   DES LAD  . Obesity   . Prostate cancer (HCC)   . Sleep apnea    CPAP  . Tachy-brady syndrome (HCC)    a. s/p STJ dual chamber PPM   . Thyroid disease   . Tobacco abuse    Quit 2014  . 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 BRAIN SURGERY  2015   hematoma evacuation  . BURR HOLE Right 04/13/2014   Procedure: BURR HOLES;  Surgeon: Henry A Pool, MD;  Location: [facility];  Service: Neurosurgery;  Laterality: Right;  . CAROTID ENDARTERECTOMY Right Feb. 25, 2010    CEA  .   CORONARY ANGIOPLASTY WITH STENT PLACEMENT  12/03/2013   LAD 90%-->0% W/ Promus Premier DES 3.0 mm x 16 mm, CFX OK, RCA 40%, EF 70-75%  . LEFT ATRIAL APPENDAGE OCCLUSION N/A 08/05/2015   Procedure: LEFT ATRIAL APPENDAGE OCCLUSION;  Surgeon: [name], MD;  Location: [facility];  Service: Cardiovascular;  Laterality: N/A;  . LEFT HEART CATHETERIZATION WITH CORONARY ANGIOGRAM Left 12/03/2013   Procedure: LEFT HEART CATHETERIZATION WITH CORONARY ANGIOGRAM;  Surgeon: Leonie Man, MD;  Location: [facility] CATH LAB;  Service: Cardiovascular;  Laterality: Left;  . LEFT HEART  CATHETERIZATION WITH CORONARY ANGIOGRAM N/A 01/26/2014   Procedure: LEFT HEART CATHETERIZATION WITH CORONARY ANGIOGRAM;  Surgeon: Jettie Booze, MD;  Location: [facility] CATH LAB;  Service: Cardiovascular;  Laterality: N/A;  . LEFT HEART CATHETERIZATION WITH CORONARY ANGIOGRAM N/A 08/03/2014   Procedure: LEFT HEART CATHETERIZATION WITH CORONARY ANGIOGRAM;  Surgeon: Burnell Blanks, MD;  Location: [facility] CATH LAB;  Service: Cardiovascular;  Laterality: N/A;  . PERCUTANEOUS CORONARY STENT INTERVENTION (PCI-S)  12/03/2013   Procedure: PERCUTANEOUS CORONARY STENT INTERVENTION (PCI-S);  Surgeon: Leonie Man, MD;  Location: [facility] CATH LAB;  Service: Cardiovascular;;  . PERMANENT PACEMAKER INSERTION N/A 09/18/2014   Procedure: PERMANENT PACEMAKER INSERTION;  Surgeon: [name], MD;  Location: [facility] CATH LAB;  Service: Cardiovascular;  Laterality: N/A;  . RADIOFREQUENCY ABLATION  2005   for PSVT  . TEE WITHOUT CARDIOVERSION N/A 07/27/2015   Procedure: TRANSESOPHAGEAL ECHOCARDIOGRAM (TEE);  Surgeon: Lelon Perla, MD;  Location: [facility] ENDOSCOPY;  Service: Cardiovascular;  Laterality: N/A;  . TEE WITHOUT CARDIOVERSION N/A 09/15/2015   Procedure: TRANSESOPHAGEAL ECHOCARDIOGRAM (TEE);  Surgeon: Thayer Headings, MD;  Location: [facility] ENDOSCOPY;  Service: Cardiovascular;  Laterality: N/A;    Social History   Socioeconomic History  . Marital status: Married    Spouse name: Not on file  . Number of children: 0  . Years of education: Not on file  . Highest education level: Not on file  Occupational History  . Occupation: Retired  Tobacco Use  . Smoking status: Former Smoker    Packs/day: 1.00    Years: 40.00    Pack years: 40.00    Types: Cigarettes    Start date: 10/20/1972    Quit date: 10/10/2012    Years since quitting: 7.5  . Smokeless tobacco: Never Used  . Tobacco comment: Quit in May.   Vaping Use  . Vaping Use: Never used  Substance and Sexual Activity  . Alcohol use: Yes    Alcohol/week: 0.0  standard drinks    Comment: former drinker-- sober since 2013.   . Drug use: No    Types: Cocaine    Comment: quit cocaine 10/2011  . Sexual activity: Yes    Partners: Female  Other Topics Concern  . Not on file  Social History Narrative   Lives in [place].   Social Determinants of Health   Financial Resource Strain: Low Risk   . Difficulty of Paying Living Expenses: Not hard at all  Food Insecurity: No Food Insecurity  . Worried About Charity fundraiser in the Last Year: Never true  . Ran Out of Food in the Last Year: Never true  Transportation Needs: No Transportation Needs  . Lack of Transportation (Medical): No  . Lack of Transportation (Non-Medical): No  Physical Activity: Inactive  . Days of Exercise per Week: 0 days  . Minutes of Exercise per Session: 0 min  Stress: No Stress Concern Present  . Feeling  of Stress : Not at all  Social Connections: Moderately Isolated  . Frequency of Communication with Friends and Family: More than three times a week  . Frequency of Social Gatherings with Friends and Family: Once a week  . Attends Religious Services: Never  . Active Member of Clubs or Organizations: No  . Attends Club or Organization Meetings: Never  . Marital Status: Married     FAMILY HISTORY:  We obtained a detailed, 4-generation family history.  Significant diagnoses are listed below: Family History  Problem Relation Age of Onset  . Hypertension Mother        Cerebrovascular disease  . Diabetes Mother   . Coronary artery disease Father 48  . Diabetes type II Father   . Hypertension Father   . Heart attack Father   . Diabetes Brother   . Hypertension Brother   . Diabetes Sister   . Hypertension Sister   . Heart attack Sister 40  . Leukemia Sister 60       CML  . Breast cancer Maternal Grandmother        dx 60s  . Lung cancer Maternal Uncle        dx >50, smoker  . Cancer Cousin        mouth cancer, dx 20s, no smoking/chew tobacco hx (maternal 1st  cousin)  . Ovarian cancer Cousin        dx <50 (maternal 1st cousin)  . Stomach cancer Cousin        dx 60s (maternal 1st cousin)  . Cancer Cousin        type unknown to pt, dx >50 (paternal 1st cousin)   Peter Dunlap does not have children. He has three full-sisters, one full-brother, and two paternal half-brothers. All of his brothers are deceased. One sister was diagnosed with chronic leukemia at age 60.   Peter Dunlap's mother died at age 91 and did not have cancer. He had three maternal aunts and five maternal uncles. One uncle had lung cancer diagnosed older than 50 and was a smoker. His maternal grandmother died in her 60s and had breast cancer (diagnosed in her 60s), and his maternal grandfather died in his 80s without cancer. Three maternal cousins have had cancer - one had mouth cancer diagnosed in her 20s without a smoking/chewing tobacco history, a second cousin had ovarian cancer diagnosed younger than 50, and the third died from stomach cancer in his 60s.   Peter Dunlap's father died in his 70s without cancer. Peter Dunlap had five paternal uncles and two paternal aunts. He does not know anything about his paternal grandparents. There is one paternal cousin who had cancer (type unknown to patient), diagnosed older than 50.   Peter Dunlap is unaware of previous family history of genetic testing for hereditary cancer risks. Patient's maternal ancestors are of African American descent, and paternal ancestors are of African American and German descent. There is no reported Ashkenazi Jewish ancestry. There is no known consanguinity.  GENETIC COUNSELING ASSESSMENT: Peter Dunlap is a 64 y.o. male with a personal history of metastatic prostate cancer and a family history of breast cancer, ovarian cancer, stomach cancer, lung cancer, and mouth cancer, which is somewhat suggestive of a hereditary cancer syndrome and predisposition to cancer. We, therefore, discussed and recommended the following at today's  visit.   DISCUSSION: We discussed that approximately 5-10% of prostate cancer is hereditary, with most cases associated with the BRCA1 and BRCA2 genes. There are other genes that can   be associated with hereditary prostate cancer syndromes. These include ATM, CHEK2, HOXB13, the Lynch syndrome genes, etc. We discussed that testing is beneficial for several reasons, including knowing about other cancer risks, identifying potential screening and risk-reduction options that may be appropriate, identifying whether targeted treatment options such as PARP inhibitors would be beneficial, and to understand if other family members could be at risk for cancer and allow them to undergo genetic testing.   We reviewed the characteristics, features and inheritance patterns of hereditary cancer syndromes. We also discussed genetic testing, including the appropriate family members to test, the process of testing, insurance coverage and turn-around-time for results. We discussed the implications of a negative, positive and/or variant of uncertain significant result. We recommended Peter Dunlap pursue genetic testing for the [facility] Common Hereditary Cancers panel + Prostate Cancer HRR panel.   The Common Hereditary Cancers Panel offered by [facility] includes sequencing and/or deletion duplication testing of the following 47 genes: APC, ATM, AXIN2, BARD1, BMPR1A, BRCA1, BRCA2, BRIP1, CDH1, CDK4, CDKN2A (p14ARF), CDKN2A (p16INK4a), CHEK2, CTNNA1, DICER1, EPCAM (Deletion/duplication testing only), GREM1 (promoter region deletion/duplication testing only), KIT, MEN1, MLH1, MSH2, MSH3, MSH6, MUTYH, NBN, NF1, NTHL1, PALB2, PDGFRA, PMS2, POLD1, POLE, PTEN, RAD50, RAD51C, RAD51D, SDHB, SDHC, SDHD, SMAD4, SMARCA4. STK11, TP53, TSC1, TSC2, and VHL.  The following genes were evaluated for sequence changes only: SDHA and HOXB13 c.251G>A variant only. The Prostate Cancer HRR Panel offered by [facility] includes sequencing and/or deletion  duplication analysis of the following 10 genes: ATM, BARD1, BRCA1, BRCA2, BRIP1, CHEK2, FANCL, PALB2, RAD51C, RAD51D.  Based on Peter Dunlap's personal and family history of cancer, he meets medical criteria for genetic testing. Despite that he meets criteria, there may still be an out of pocket cost.    PLAN: After considering the risks, benefits, and limitations, Peter Dunlap provided informed consent to pursue genetic testing and the blood sample was sent to [facility] for analysis of the Common Hereditary Cancers + Prostate Cancer HRR panel. Results should be available within approximately two-three weeks' time, at which point they will be disclosed by telephone to Peter Dunlap, as will any additional recommendations warranted by these results. Peter Dunlap will receive a summary of his genetic counseling visit and a copy of his results once available. This information will also be available in Epic.  Peter Dunlap's questions were answered to his satisfaction today. Our contact information was provided should additional questions or concerns arise. Thank you for the referral and allowing us to share in the care of your patient.   Emily Stiglich, MS, LCGC Licensed, Certified Genetic Counselor [email or web address] Phone: [phone]  The patient was seen for a total of 30 minutes in face-to-face genetic counseling.  This patient was discussed with Drs. Magrinat, Gudena and/or Feng who agrees with the above.    _______________________________________________________________________ For Office Staff:  Number of people involved in session: 1 Was an Intern/ student involved with case: no 

## 2020-05-13 NOTE — Progress Notes (Signed)
Patient Care Team: [facility], Peter Nunnery, MD as PCP - General (Family Medicine) [name], MD as PCP - Cardiology (Cardiology) [name], MD (Cardiology) Early, Arvilla Meres, MD as Attending Physician (Vascular Surgery) Cassandria Anger, MD as Consulting Physician (Endocrinology) [name], MD as Consulting Physician (Cardiology) Edythe Clarity, [facility] as Pharmacist (Pharmacist) Dishmon, Garwin Brothers, RN as Oncology Nurse Navigator (Oncology)  DIAGNOSIS:  Encounter Diagnosis  Name Primary?  . Metastatic castration-sensitive adenocarcinoma of prostate ([place])      CHIEF COMPLIANT: Follow-up of metastatic prostate cancer  INTERVAL HISTORY: Peter Dunlap is a 64 year old with above-mentioned history of castrate sensitive metastatic prostate cancer who started injections with Firmagon in November as well as apalutamide.  He developed laparoscopically loading dose of Firmagon.  He otherwise does not have any major complaints.  He has noticed that his blood sugars in the 300s.  He follows with Dr. Murvin Natal.  He will call their office and discuss his results.  He underwent genetic counseling today and genetic testing will be done today as well.   ALLERGIES:  is allergic to trazodone and nefazodone and lactose intolerance (gi).  MEDICATIONS:  Current Outpatient Medications  Medication Sig Dispense Refill  . allopurinol (ZYLOPRIM) 100 MG tablet TAKE 1 TABLET(100 MG) BY MOUTH DAILY 90 tablet 0  . apalutamide (ERLEADA) 60 MG tablet Take 4 tablets (240 mg total) by mouth daily. May be taken with or without food. Swallow tablets whole. 120 tablet 0  . aspirin EC 325 MG tablet Take 1 tablet (325 mg total) daily by mouth. 30 tablet 0  . Blood Glucose Monitoring Suppl (BLOOD GLUCOSE SYSTEM PAK) KIT Use as directed to monitor FSBS 1x daily. Dx: E11.9. 1 kit 1  . colchicine 0.6 MG tablet     . Cyanocobalamin (VITAMIN B 12) 500 MCG TABS Take by mouth.    . DEXILANT 60 MG capsule TAKE 1  CAPSULE(60 MG) BY MOUTH DAILY 30 capsule 5  . furosemide (LASIX) 20 MG tablet TAKE 3 TABLETS BY MOUTH DAILY 270 tablet 3  . glipiZIDE (GLUCOTROL XL) 5 MG 24 hr tablet Take 1 tablet (5 mg total) by mouth daily with breakfast. 90 tablet 0  . isosorbide mononitrate (IMDUR) 60 MG 24 hr tablet TAKE 1 TABLET BY MOUTH DAILY 90 tablet 3  . levothyroxine (SYNTHROID) 200 MCG tablet TAKE 1 TABLET(200 MCG) BY MOUTH DAILY BEFORE BREAKFAST 90 tablet 0  . loratadine (CLARITIN) 10 MG tablet TAKE 1 TABLET(10 MG) BY MOUTH DAILY AS NEEDED FOR ALLERGIES 30 tablet 2  . Magnesium Oxide 400 (240 Mg) MG TABS TAKE 1 TABLET BY MOUTH TWICE DAILY 60 tablet 8  . metFORMIN (GLUCOPHAGE) 500 MG tablet TAKE 1 TABLET BY MOUTH TWICE DAILY 180 tablet 0  . nitroGLYCERIN (NITROSTAT) 0.4 MG SL tablet PLACE 1 TABLET UNDER THE TONGUE EVERY 5 MINUTES AS NEEDED FOR CHEST PAIN. CALL 911 AT THIRD DOSE IN 15 MINUTES 25 tablet 1  . ONETOUCH ULTRA test strip USE TO TEST ONCE DAILY 100 strip 1  . oxyCODONE-acetaminophen (PERCOCET) 10-325 MG tablet Take 1 tablet by mouth every 8 (eight) hours as needed for pain. 90 tablet 0  . pantoprazole (PROTONIX) 40 MG tablet Take 1 tablet (40 mg total) by mouth daily. 30 tablet 3  . potassium chloride SA (KLOR-CON) 20 MEQ tablet Take 1 tablet (20 mEq total) by mouth daily. 90 tablet 3  . silodosin (RAPAFLO) 8 MG CAPS capsule Take 1 capsule (8 mg total) by mouth 2 (  two) times daily. 60 capsule 11  . SIMVASTATIN PO     . sotalol (BETAPACE) 160 MG tablet TAKE 1 TABLET(160 MG) BY MOUTH TWICE DAILY 180 tablet 3  . SOTALOL HCL PO     . Vitamin D, Ergocalciferol, (DRISDOL) 1.25 MG (50000 UNIT) CAPS capsule TAKE 1 CAPSULE BY MOUTH EVERY 7 DAYS 12 capsule 0   No current facility-administered medications for this visit.    PHYSICAL EXAMINATION: ECOG PERFORMANCE STATUS: 1 - Symptomatic but completely ambulatory  Vitals:   05/13/20 1026  BP: (!) 149/70  Pulse: 61  Resp: 18  Temp: 98.4 F (36.9 C)  SpO2: 97%    Filed Weights   05/13/20 1026  Weight: (!) 315 lb 6.4 oz (143.1 kg)       LABORATORY DATA:  I have reviewed the data as listed CMP Latest Ref Rng & Units 03/30/2020 03/15/2020 03/09/2020  Glucose 65 - 99 mg/dL 136(H) 141(H) -  BUN 7 - 25 mg/dL 10 10 -  Creatinine 0.70 - 1.25 mg/dL 1.04 1.17 1.10  Sodium 135 - 146 mmol/L 139 137 -  Potassium 3.5 - 5.3 mmol/L 4.0 4.4 -  Chloride 98 - 110 mmol/L 104 102 -  CO2 20 - 32 mmol/L 24 25 -  Calcium 8.6 - 10.3 mg/dL 9.3 9.3 -  Total Protein 6.1 - 8.1 g/dL 7.3 7.6 -  Total Bilirubin 0.2 - 1.2 mg/dL 1.2 1.3(H) -  Alkaline Phos 40 - 115 U/L - - -  AST 10 - 35 U/L 27 42(H) -  ALT 9 - 46 U/L 13 23 -    Lab Results  Component Value Date   WBC 6.6 03/15/2020   HGB 15.5 03/15/2020   HCT 44.5 03/15/2020   MCV 96.3 03/15/2020   PLT 117 (L) 03/15/2020   NEUTROABS 3,722 03/15/2020    ASSESSMENT & PLAN:  Metastatic castration-sensitive adenocarcinoma of prostate ([place]) 1.  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 -Status post IMRT, 40 sessions completed on 06/14/2016 by Dr. Tammi Klippel. -PSA 0.7 (10/15/2018), 0.8 (02/18/2019), 1.4 (December 2020), 1.4 (June 2021) -Prostate biopsy on 02/10/2020-1 out of 12 cores from left mid lateral region positive for adenocarcinoma. -CTAP on 03/09/2020 with newly enlarged left iliac lymph nodes measuring 1.1 x 0.9 cm.  Hepatic steatosis with early signs of cirrhosis. -F-18 PSMA PET scan on 04/12/2020 showed radiotracer activity associated with enlarged left external iliac lymph node 9 mm with SUV 7.6.  Small left common iliac lymph node 6 mm, SUV 8.6.  Left periaortic lymph node 5 mm, SUV 6.3.  Lymph node between IVC and aorta at the level of right renal hilum, SUV 8.6.  Lymph node deep to the IVC measuring 6 mm, SUV 5.4.  No skeletal metastasis.  2.   Social/family history: -He is a retired Nature conservation officer.  He lives at home with his wife.  He plays golf.  He quit smoking in 2015, smoked 1 pack/day for more than 20 years. -Sister has CML.  Maternal grandmother had breast cancer.  Maternal uncle had lung cancer.  2 maternal cousins had tongue cancer and uterine cancer.   PLAN:  1.  Metastatic castration sensitive prostate cancer to pelvic and retroperitoneal lymph nodes: Current treatment: Degarelix with apalutamide (started 04/21/2020) Genetic testing and NGS testing ordered by Dr. Delton Coombes.  Counseling was done today and blood work will be drawn today as well.  2.  Poorly controlled diabetes: -He follows up with Dr. Dorris Fetch. -He is on Metformin 500 mg twice daily.  He is also on glipizide 5 mg daily with breakfast. -He informed me that his blood sugars in the 300s.  I discussed with him that he needs to immediately contact Dr. Danelle Earthly and get his blood sugars under better control.  He will come back next week for his injection of Firmagon. He will be seen by Dr. Delton Coombes in 1 month.  Orders Placed This Encounter  Procedures  . CBC with Differential (Cancer Center Only)    Standing Status:   Standing    Number of Occurrences:   10    Standing Expiration Date:   05/13/2021  . CMP ([facility] only)    Standing Status:   Standing    Number of Occurrences:   10    Standing Expiration Date:   05/13/2021  . PSA    Standing Status:   Standing    Number of Occurrences:   20    Standing Expiration Date:   06/13/2020  . CBC with Differential    Standing Status:   Standing    Number of Occurrences:   20    Standing Expiration Date:   05/13/2021  . Comprehensive metabolic panel    Standing Status:   Standing    Number of Occurrences:   20    Standing Expiration Date:   05/13/2021   The patient has a good understanding of the overall plan. he agrees with it. he will call with any problems that may develop before the next visit  here. Total time spent: 30 mins including face to face time and time spent for planning, charting and co-ordination of care   Harriette Ohara, MD 05/13/20

## 2020-05-13 NOTE — Telephone Encounter (Signed)
Oral Chemotherapy Pharmacist Encounter  Patient Education I spoke with patient and his wife for education on 04/21/20 for overview of new oral chemotherapy medication: Erleada (apalutamide) for the treatment of metastatic prostate cancer castration sensitive in conjunction with degarelix, planned duration until disease progression or unacceptable drug toxicity  Pt is doing well. Counseled patient on administration, dosing, side effects, monitoring, drug-food interactions, safe handling, storage, and disposal. Patient will take 4 tablets (240 mg total) by mouth daily. May be taken with or without food.  Side effects include but not limited to: decreased wbc/hgb, fatigue.    Reviewed with patient importance of keeping a medication schedule and plan for any missed doses.  After discussion with patient no patient barriers to medication adherence identified.   They voiced understanding and appreciation. All questions answered. Medication handout provided.  Provided patient with [facility] phone number. Patient knows to call the office with questions or concerns. Oral Chemotherapy Navigation Clinic will continue to follow.  Darl Pikes, PharmD, BCPS, BCOP, CPP Hematology/Oncology Clinical Pharmacist Practitioner [facility] [phone]

## 2020-05-13 NOTE — Assessment & Plan Note (Signed)
1.  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 -Status post IMRT, 40 sessions completed on 06/14/2016 by Dr. Tammi Klippel. -PSA 0.7 (10/15/2018), 0.8 (02/18/2019), 1.4 (December 2020), 1.4 (June 2021) -Prostate biopsy on 02/10/2020-1 out of 12 cores from left mid lateral region positive for adenocarcinoma. -CTAP on 03/09/2020 with newly enlarged left iliac lymph nodes measuring 1.1 x 0.9 cm.  Hepatic steatosis with early signs of cirrhosis. -F-18 PSMA PET scan on 04/12/2020 showed radiotracer activity associated with enlarged left external iliac lymph node 9 mm with SUV 7.6.  Small left common iliac lymph node 6 mm, SUV 8.6.  Left periaortic lymph node 5 mm, SUV 6.3.  Lymph node between IVC and aorta at the level of right renal hilum, SUV 8.6.  Lymph node deep to the IVC measuring 6 mm, SUV 5.4.  No skeletal metastasis.  2.  Social/family history: -He is a retired [employer] conservation officer.  He lives at home with his wife.  He plays golf.  He quit smoking in 2015, smoked 1 pack/day for more than 20 years. -Sister has CML.  Maternal grandmother had breast cancer.  Maternal uncle had lung cancer.  2 maternal cousins had tongue cancer and uterine cancer.   PLAN:  1.  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  Poorly controlled diabetes: -He follows up with Dr. Dorris Fetch. -He is on Metformin 500 mg twice daily.  He is also on glipizide 5 mg daily with breakfast. -We will closely monitor his sugars as apalutamide can increase the blood sugars.

## 2020-05-13 NOTE — Telephone Encounter (Signed)
Patient stopped by and said that the RX Apalutamide is causing his sugar to go up. He said he is being treated for prostate cancer. He does not have readings with him but will call us when he gets home. He said its been running in the 300's. Please advise

## 2020-05-13 NOTE — Patient Instructions (Signed)
[facility] at [facility] Discharge Instructions  You were seen today by Dr. Gudena. Follow up as scheduled.  Thank you for choosing [facility] at [facility] to provide your oncology and hematology care.  To afford each patient quality time with our provider, please arrive at least 15 minutes before your scheduled appointment time.   If you have a lab appointment with the Cancer Center please come in thru the Main Entrance and check in at the main information desk.  You need to re-schedule your appointment should you arrive 10 or more minutes late.  We strive to give you quality time with our providers, and arriving late affects you and other patients whose appointments are after yours.  Also, if you no show three or more times for appointments you may be dismissed from the clinic at the providers discretion.     Again, thank you for choosing [facility].  Our hope is that these requests will decrease the amount of time that you wait before being seen by our physicians.       _____________________________________________________________  Should you have questions after your visit to [facility], please contact our office at [phone] and follow the prompts.  Our office hours are 8:00 a.m. and 4:30 p.m. Monday - Friday.  Please note that voicemails left after 4:00 p.m. may not be returned until the following business day.  We are closed weekends and major holidays.  You do have access to a nurse 24-7, just call the main number to the clinic [phone] and do not press any options, hold on the line and a nurse will answer the phone.    For prescription refill requests, have your pharmacy contact our office and allow 72 hours.    Due to Covid, you will need to wear a mask upon entering the hospital. If you do not have a mask, a mask will be given to you at the Main Entrance upon arrival. For doctor visits, patients may have  1 support person age 18 or older with them. For treatment visits, patients can not have anyone with them due to social distancing guidelines and our immunocompromised population.      

## 2020-05-13 NOTE — Telephone Encounter (Signed)
I called patient to get some readings, no answer.

## 2020-05-14 NOTE — Telephone Encounter (Signed)
Please advise 

## 2020-05-14 NOTE — Telephone Encounter (Signed)
Advise him to monitor 4 times a day - before meals and at bed time, and a visit with Whitney in a week with his meter and logs.

## 2020-05-14 NOTE — Telephone Encounter (Signed)
Patient is asking for a return call

## 2020-05-14 NOTE — Telephone Encounter (Signed)
Pt has been randomly checking his BG in the mornings before breakfast. On 11/30 441, 12/1 368,12/2 372, 12/3 336, 12/7 322 and this morning 136. Pt is taking glipizide 10mg  daily and metformin 500mg  2 time daily.

## 2020-05-17 ENCOUNTER — Other Ambulatory Visit (HOSPITAL_COMMUNITY): Payer: Self-pay | Admitting: Hematology

## 2020-05-18 ENCOUNTER — Other Ambulatory Visit: Payer: Self-pay | Admitting: Family Medicine

## 2020-05-18 DIAGNOSIS — E1169 Type 2 diabetes mellitus with other specified complication: Secondary | ICD-10-CM

## 2020-05-19 ENCOUNTER — Other Ambulatory Visit (HOSPITAL_COMMUNITY): Payer: Self-pay | Admitting: Hematology

## 2020-05-20 ENCOUNTER — Inpatient Hospital Stay (HOSPITAL_COMMUNITY): Payer: Medicare Other

## 2020-05-20 ENCOUNTER — Other Ambulatory Visit: Payer: Self-pay

## 2020-05-20 VITALS — BP 127/65 | HR 63 | Temp 96.9°F | Resp 18

## 2020-05-20 DIAGNOSIS — Z803 Family history of malignant neoplasm of breast: Secondary | ICD-10-CM | POA: Diagnosis not present

## 2020-05-20 DIAGNOSIS — Z87891 Personal history of nicotine dependence: Secondary | ICD-10-CM | POA: Diagnosis not present

## 2020-05-20 DIAGNOSIS — C61 Malignant neoplasm of prostate: Secondary | ICD-10-CM

## 2020-05-20 DIAGNOSIS — E119 Type 2 diabetes mellitus without complications: Secondary | ICD-10-CM | POA: Diagnosis not present

## 2020-05-20 DIAGNOSIS — Z7984 Long term (current) use of oral hypoglycemic drugs: Secondary | ICD-10-CM | POA: Diagnosis not present

## 2020-05-20 DIAGNOSIS — C778 Secondary and unspecified malignant neoplasm of lymph nodes of multiple regions: Secondary | ICD-10-CM | POA: Diagnosis not present

## 2020-05-20 DIAGNOSIS — K760 Fatty (change of) liver, not elsewhere classified: Secondary | ICD-10-CM | POA: Diagnosis not present

## 2020-05-20 DIAGNOSIS — Z808 Family history of malignant neoplasm of other organs or systems: Secondary | ICD-10-CM | POA: Diagnosis not present

## 2020-05-20 DIAGNOSIS — Z801 Family history of malignant neoplasm of trachea, bronchus and lung: Secondary | ICD-10-CM | POA: Diagnosis not present

## 2020-05-20 DIAGNOSIS — Z5111 Encounter for antineoplastic chemotherapy: Secondary | ICD-10-CM | POA: Diagnosis not present

## 2020-05-20 DIAGNOSIS — Z191 Hormone sensitive malignancy status: Secondary | ICD-10-CM

## 2020-05-20 DIAGNOSIS — Z79899 Other long term (current) drug therapy: Secondary | ICD-10-CM | POA: Diagnosis not present

## 2020-05-20 LAB — PSA: Prostatic Specific Antigen: 0.15 ng/mL (ref 0.00–4.00)

## 2020-05-20 LAB — CBC WITH DIFFERENTIAL/PLATELET
Abs Immature Granulocytes: 0 10*3/uL (ref 0.00–0.07)
Basophils Absolute: 0.1 10*3/uL (ref 0.0–0.1)
Basophils Relative: 1 %
Eosinophils Absolute: 0.1 10*3/uL (ref 0.0–0.5)
Eosinophils Relative: 2 %
HCT: 42.4 % (ref 39.0–52.0)
Hemoglobin: 14.5 g/dL (ref 13.0–17.0)
Immature Granulocytes: 0 %
Lymphocytes Relative: 37 %
Lymphs Abs: 1.7 10*3/uL (ref 0.7–4.0)
MCH: 34.5 pg — ABNORMAL HIGH (ref 26.0–34.0)
MCHC: 34.2 g/dL (ref 30.0–36.0)
MCV: 101 fL — ABNORMAL HIGH (ref 80.0–100.0)
Monocytes Absolute: 0.4 10*3/uL (ref 0.1–1.0)
Monocytes Relative: 8 %
Neutro Abs: 2.4 10*3/uL (ref 1.7–7.7)
Neutrophils Relative %: 52 %
Platelets: 101 10*3/uL — ABNORMAL LOW (ref 150–400)
RBC: 4.2 MIL/uL — ABNORMAL LOW (ref 4.22–5.81)
RDW: 13.3 % (ref 11.5–15.5)
WBC: 4.7 10*3/uL (ref 4.0–10.5)
nRBC: 0 % (ref 0.0–0.2)

## 2020-05-20 LAB — COMPREHENSIVE METABOLIC PANEL
ALT: 16 U/L (ref 0–44)
AST: 34 U/L (ref 15–41)
Albumin: 3.8 g/dL (ref 3.5–5.0)
Alkaline Phosphatase: 78 U/L (ref 38–126)
Anion gap: 10 (ref 5–15)
BUN: 14 mg/dL (ref 8–23)
CO2: 20 mmol/L — ABNORMAL LOW (ref 22–32)
Calcium: 8.9 mg/dL (ref 8.9–10.3)
Chloride: 103 mmol/L (ref 98–111)
Creatinine, Ser: 1.2 mg/dL (ref 0.61–1.24)
GFR, Estimated: >60 mL/min (ref >60)
Glucose, Bld: 215 mg/dL — ABNORMAL HIGH (ref 70–99)
Potassium: 3.9 mmol/L (ref 3.5–5.1)
Sodium: 133 mmol/L — ABNORMAL LOW (ref 135–145)
Total Bilirubin: 1 mg/dL (ref 0.3–1.2)
Total Protein: 7.8 g/dL (ref 6.5–8.1)

## 2020-05-20 MED ORDER — DEGARELIX ACETATE 80 MG ~~LOC~~ SOLR
80.0000 mg | Freq: Once | SUBCUTANEOUS | Status: AC
  Administered 2020-05-20: 80 mg via SUBCUTANEOUS
  Filled 2020-05-20: qty 4

## 2020-05-20 MED FILL — ERLEADA 60 MG TABS: 60 | 30 days supply | Qty: 120 | Fill #0

## 2020-05-20 NOTE — Progress Notes (Signed)
Patient tolerated Firmagon injection with no complaints voiced.  Site clean and dry with no bruising or swelling noted.  No complaints of pain.  Discharged with vital signs stable and no signs or symptoms of distress noted.   

## 2020-05-21 ENCOUNTER — Ambulatory Visit: Payer: Self-pay | Admitting: Pharmacist

## 2020-05-21 ENCOUNTER — Ambulatory Visit (INDEPENDENT_AMBULATORY_CARE_PROVIDER_SITE_OTHER): Payer: Medicare Other | Admitting: Nurse Practitioner

## 2020-05-21 ENCOUNTER — Encounter: Payer: Self-pay | Admitting: Nurse Practitioner

## 2020-05-21 VITALS — BP 91/61 | HR 66 | Wt 314.0 lb

## 2020-05-21 DIAGNOSIS — E782 Mixed hyperlipidemia: Secondary | ICD-10-CM | POA: Diagnosis not present

## 2020-05-21 DIAGNOSIS — E890 Postprocedural hypothyroidism: Secondary | ICD-10-CM

## 2020-05-21 DIAGNOSIS — E1165 Type 2 diabetes mellitus with hyperglycemia: Secondary | ICD-10-CM | POA: Diagnosis not present

## 2020-05-21 DIAGNOSIS — I10 Essential (primary) hypertension: Secondary | ICD-10-CM

## 2020-05-21 DIAGNOSIS — E559 Vitamin D deficiency, unspecified: Secondary | ICD-10-CM | POA: Diagnosis not present

## 2020-05-21 NOTE — Patient Instructions (Signed)

## 2020-05-21 NOTE — Progress Notes (Signed)
05/21/2020                                                 Endocrinology Follow Up  Subjective:    Patient ID: Peter Dunlap, male    DOB: Feb 06, 1956, PCP [name], MD  Patient presents today for follow-up of hypothyroidism following radioactive iodine treatment, and type 2 diabetes.  Past Medical History:  Diagnosis Date   Arteriosclerotic cardiovascular disease (ASCVD) 2005   catheterization in 10/2010:50% mid LAD, diffuse distal disease, circumflex irregularities, large dominant RCA with a 50% ostial, 70% distal, 60% posterolateral and 70% PDA; normal EF   Arthritis    Benign prostatic hypertrophy    Bilateral carpal tunnel syndrome 07/03/2018   Cerebrovascular disease 2010   R. carotid endarterectomy; Duplex in 10/2010-widely patent ICAs, subtotal left vertebral-not thought to be contributing to symptoms   Cervical spine disease    CT in 2012-advanced degeneration and spondylosis with moderate spinal stenosis at C3-C6   CHF (congestive heart failure) ([place])    Depression    Diabetes mellitus without complication ([place])    Erectile dysfunction    Family history of breast cancer    Family history of cancer of mouth    Family history of CML (chronic monocytic leukemia)    Family history of lung cancer    Family history of ovarian cancer    Family history of stomach cancer    Gastroesophageal reflux disease    H/O hiatal hernia    H/O: substance abuse ([place])    Cocaine, marijuana, alcohol.  Quit 2013.    Hyperlipidemia    Hypertension    Non-ST elevation myocardial infarction (NSTEMI), initial episode of care ([place]) 12/02/2013   DES LAD   Obesity    Prostate cancer ([place])    Sleep apnea    CPAP   Tachy-brady syndrome ([place])    a. 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 25, 2010    CEA   CORONARY ANGIOPLASTY WITH STENT PLACEMENT  12/03/2013   LAD 90%-->0% W/ Promus Premier DES 3.0 mm x 16 mm, CFX OK, RCA 40%, EF 70-75%   LEFT ATRIAL APPENDAGE OCCLUSION N/A 08/05/2015   Procedure: LEFT ATRIAL APPENDAGE OCCLUSION;  Surgeon: [name], MD;  Location: [place] CV LAB;  Service: Cardiovascular;  Laterality: N/A;   LEFT HEART CATHETERIZATION WITH CORONARY ANGIOGRAM Left 12/03/2013   Procedure: LEFT HEART CATHETERIZATION WITH CORONARY ANGIOGRAM;  Surgeon: Leonie Man, MD;  Location: [facility] CATH LAB;  Service: Cardiovascular;  Laterality: Left;   LEFT HEART CATHETERIZATION WITH CORONARY ANGIOGRAM N/A 01/26/2014   Procedure: LEFT HEART CATHETERIZATION WITH CORONARY ANGIOGRAM;  Surgeon: Jettie Booze, MD;  Location: [facility] CATH LAB;  Service: Cardiovascular;  Laterality: N/A;   LEFT HEART CATHETERIZATION WITH CORONARY ANGIOGRAM N/A 08/03/2014   Procedure: LEFT HEART CATHETERIZATION WITH CORONARY ANGIOGRAM;  Surgeon: Burnell Blanks, MD;  Location: [facility] CATH LAB;  Service: Cardiovascular;  Laterality: N/A;   PERCUTANEOUS CORONARY STENT INTERVENTION (PCI-S)  12/03/2013   Procedure: PERCUTANEOUS CORONARY STENT INTERVENTION (PCI-S);  Surgeon: Leonie Man, MD;  Location: [facility] CATH LAB;  Service: Cardiovascular;;   PERMANENT PACEMAKER INSERTION N/A 09/18/2014   Procedure: PERMANENT PACEMAKER INSERTION;  Surgeon: [name], MD;  Location: [facility] CATH LAB;  Service: Cardiovascular;  Laterality: N/A;   RADIOFREQUENCY ABLATION  2005   for PSVT   TEE WITHOUT CARDIOVERSION N/A 07/27/2015   Procedure: TRANSESOPHAGEAL ECHOCARDIOGRAM (TEE);  Surgeon: Lelon Perla, MD;  Location: [facility] ENDOSCOPY;  Service: Cardiovascular;  Laterality: N/A;   TEE WITHOUT  CARDIOVERSION N/A 09/15/2015   Procedure: TRANSESOPHAGEAL ECHOCARDIOGRAM (TEE);  Surgeon: Thayer Headings, MD;  Location: [facility] ENDOSCOPY;  Service: Cardiovascular;  Laterality: N/A;   Social History   Socioeconomic History   Marital status: Married    Spouse name: Not on file   Number of children: 0   Years of education: Not on file   Highest education level: Not on file  Occupational History   Occupation: Retired  Tobacco Use   Smoking status: Former Smoker    Packs/day: 1.00    Years: 40.00    Pack years: 40.00    Types: Cigarettes    Start date: 10/20/1972    Quit date: 10/10/2012    Years since quitting: 7.6   Smokeless tobacco: Never Used   Tobacco comment: Quit in May.   Vaping Use   Vaping Use: Never used  Substance and Sexual Activity   Alcohol use: Yes    Alcohol/week: 0.0 standard drinks    Comment: former drinker-- sober since 2013.    Drug use: No    Types: Cocaine    Comment: quit cocaine 10/2011   Sexual activity: Yes    Partners: Female  Other Topics Concern   Not on file  Social History Narrative   Lives in [place].   Social Determinants of Health   Financial Resource Strain: Low Risk    Difficulty of Paying Living Expenses: Not hard at all  Food Insecurity: No Food Insecurity   Worried About Charity fundraiser in the Last Year: Never true   [place] in the Last Year: Never true  Transportation Needs: No Transportation Needs   Lack of Transportation (Medical): No   Lack of Transportation (Non-Medical): No  Physical Activity: Inactive   Days of Exercise per Week: 0 days   Minutes of Exercise per Session: 0 min  Stress: No Stress Concern Present   Feeling of Stress : Not at all  Social Connections: Moderately Isolated   Frequency of Communication with Friends and Family: More than three times a week   Frequency of Social Gatherings with Friends and Family: Once a week   Attends Religious Services: Never   Building surveyor or Organizations: No   Attends Archivist Meetings: Never   Marital Status: Married   Outpatient Encounter Medications as of 05/21/2020  Medication Sig   allopurinol (ZYLOPRIM) 100 MG tablet TAKE 1 TABLET(100 MG) BY MOUTH DAILY   aspirin EC 325 MG tablet Take 1 tablet (325 mg total) daily by mouth.   Blood Glucose Monitoring Suppl (BLOOD GLUCOSE SYSTEM PAK) KIT Use as directed to monitor FSBS 1x daily. Dx: E11.9.   colchicine 0.6 MG tablet    Cyanocobalamin (VITAMIN B 12) 500 MCG TABS Take by mouth.   DEXILANT 60 MG capsule TAKE 1 CAPSULE(60 MG) BY MOUTH DAILY  ERLEADA 60 MG tablet TAKE 4 TABLETS (240 MG TOTAL) BY MOUTH DAILY. MAY BE TAKEN WITH OR WITHOUT FOOD. SWALLOW TABLETS WHOLE.   furosemide (LASIX) 20 MG tablet TAKE 3 TABLETS BY MOUTH DAILY   glipiZIDE (GLUCOTROL XL) 5 MG 24 hr tablet TAKE 1 TABLET(5 MG) BY MOUTH DAILY WITH BREAKFAST   isosorbide mononitrate (IMDUR) 60 MG 24 hr tablet TAKE 1 TABLET BY MOUTH DAILY   levothyroxine (SYNTHROID) 200 MCG tablet TAKE 1 TABLET(200 MCG) BY MOUTH DAILY BEFORE BREAKFAST   loratadine (CLARITIN) 10 MG tablet TAKE 1 TABLET(10 MG) BY MOUTH DAILY AS NEEDED FOR ALLERGIES   Magnesium Oxide 400 (240 Mg) MG TABS TAKE 1 TABLET BY MOUTH TWICE DAILY   metFORMIN (GLUCOPHAGE) 500 MG tablet TAKE 1 TABLET BY MOUTH TWICE DAILY   nitroGLYCERIN (NITROSTAT) 0.4 MG SL tablet PLACE 1 TABLET UNDER THE TONGUE EVERY 5 MINUTES AS NEEDED FOR CHEST PAIN. CALL 911 AT THIRD DOSE IN 15 MINUTES   ONETOUCH ULTRA test strip USE TO TEST ONCE DAILY   oxyCODONE-acetaminophen (PERCOCET) 10-325 MG tablet Take 1 tablet by mouth every 8 (eight) hours as needed for pain.   pantoprazole (PROTONIX) 40 MG tablet Take 1 tablet (40 mg total) by mouth daily.   potassium chloride SA (KLOR-CON) 20 MEQ tablet Take 1 tablet (20 mEq total) by mouth daily.   silodosin (RAPAFLO) 8 MG CAPS capsule Take 1 capsule (8 mg total) by mouth 2 (two) times  daily.   SIMVASTATIN PO    sotalol (BETAPACE) 160 MG tablet TAKE 1 TABLET(160 MG) BY MOUTH TWICE DAILY   SOTALOL HCL PO    Vitamin D, Ergocalciferol, (DRISDOL) 1.25 MG (50000 UNIT) CAPS capsule TAKE 1 CAPSULE BY MOUTH EVERY 7 DAYS   No facility-administered encounter medications on file as of 05/21/2020.   ALLERGIES: Allergies  Allergen Reactions   Trazodone And Nefazodone     Nightmares   Lactose Intolerance (Gi) Other (See Comments)    UPSET STOMACH    VACCINATION STATUS: Immunization History  Administered Date(s) Administered   Influenza,inj,Quad PF,6+ Mos 02/17/2014, 03/09/2015, 03/10/2016, 03/27/2017, 03/20/2018, 03/04/2019, 03/15/2020   Moderna Sars-Covid-2 Vaccination 08/03/2019, 09/06/2019   Pneumococcal Polysaccharide-23 01/05/2014   Tdap 12/19/2010   Zoster Recombinat (Shingrix) 12/17/2019, 02/15/2020    Diabetes He presents for his follow-up diabetic visit. He has type 2 diabetes mellitus. His disease course has been stable. There are no hypoglycemic associated symptoms. Pertinent negatives for hypoglycemia include no headaches or tremors. Pertinent negatives for diabetes include no chest pain, no fatigue, no foot paresthesias, no polydipsia and no polyuria. There are no hypoglycemic complications. Symptoms are stable. Diabetic complications include heart disease and impotence. Risk factors for coronary artery disease include sedentary lifestyle, hypertension, diabetes mellitus, dyslipidemia, male sex, obesity and tobacco exposure. Current diabetic treatment includes oral agent (dual therapy). He is compliant with treatment most of the time. His weight is decreasing steadily. He is following a generally unhealthy diet. When asked about meal planning, he reported none. He has not had a previous visit with a dietitian. He never participates in exercise. His home blood glucose trend is decreasing steadily. His breakfast blood glucose range is generally 110-130 mg/dl.  His bedtime blood glucose range is generally 130-140 mg/dl. (He presents today for a follow up due to increasing glucose readings since he started his Erleada for prostate cancer treatment.  Initially, he had readings above 500, but most recently his glucose has leveled back out to normal ranges.  He denies any hypoglycemia.) An ACE  inhibitor/angiotensin II receptor blocker is not being taken. He does not see a podiatrist.Eye exam is not current.  Thyroid Problem Presents for follow-up (He was given RAI therapy on March 16, 2014. - he denies family history of thyroid dysfunction .) visit. Patient reports no cold intolerance, constipation, diarrhea, fatigue, heat intolerance, palpitations or tremors. The symptoms have been stable.    Review of systems  Constitutional: + Minimally fluctuating body weight,  current There is no height or weight on file to calculate BMI. , no fatigue, no subjective hyperthermia, no subjective hypothermia Eyes: no blurry vision, no xerophthalmia ENT: no sore throat, no nodules palpated in throat, no dysphagia/odynophagia, no hoarseness Cardiovascular: no chest pain, no shortness of breath, no palpitations, no leg swelling Respiratory: no cough, no shortness of breath Gastrointestinal: no nausea/vomiting/diarrhea Musculoskeletal: no muscle/joint aches Skin: + itchy red swollen area at injection site from U[place] injection given at oncologist yesterday Neurological: no tremors, no numbness, no tingling, + dizziness this morning with position change Psychiatric: no depression, no anxiety  Objective:    There were no vitals taken for this visit.  Wt Readings from Last 3 Encounters:  05/13/20 (!) 315 lb 6.4 oz (143.1 kg)  04/19/20 (!) 315 lb 4.8 oz (143 kg)  04/02/20 (!) 317 lb (143.8 kg)    BP Readings from Last 3 Encounters:  05/20/20 127/65  05/13/20 (!) 149/70  04/21/20 131/64     Physical Exam- Limited  Constitutional:  There is no height or weight on  file to calculate BMI. , not in acute distress, normal state of mind Eyes:  EOMI, no exophthalmos Neck: Supple Cardiovascular: RRR, no murmers, rubs, or gallops, no edema Respiratory: Adequate breathing efforts, no crackles, rales, rhonchi, or wheezing Musculoskeletal: no gross deformities, strength intact in all four extremities, no gross restriction of joint movements Skin:  no rashes, no hyperemia, warm erythematous area to his left lower abdomen (site of his Firmagon injection at oncologist yesterday) Neurological: no tremor with outstretched hands    Diabetic Labs (most recent): Lab Results  Component Value Date   HGBA1C 7.0 (H) 04/19/2020   HGBA1C 7.4 (H) 03/15/2020   HGBA1C 7.0 (A) 01/01/2020     Lipid Panel ( most recent) Lipid Panel     Component Value Date/Time   CHOL 122 03/30/2020 0735   TRIG 120 03/30/2020 0735   HDL 37 (L) 03/30/2020 0735   CHOLHDL 3.3 03/30/2020 0735   VLDL 28 10/17/2016 0819   LDLCALC 64 03/30/2020 0735    Recent Results (from the past 2160 hour(s))  I-STAT creatinine     Status: None   Collection Time: 03/09/20 10:34 AM  Result Value Ref Range   Creatinine, Ser 1.10 0.61 - 1.24 mg/dL  VITAMIN D 25 Hydroxy (Vit-D Deficiency, Fractures)     Status: None   Collection Time: 03/15/20 11:42 AM  Result Value Ref Range   Vit D, 25-Hydroxy 74 30 - 100 ng/mL    Comment: Vitamin D Status         25-OH Vitamin D: . Deficiency:                    <20 ng/mL Insufficiency:             20 - 29 ng/mL Optimal:                 > or = 30 ng/mL . For 25-OH Vitamin D testing on patients on  D2-supplementation and patients for whom quantitation  of D2 and D3 fractions is required, the QuestAssureD(TM) 25-OH VIT D, (D2,D3), LC/MS/MS is recommended: order  code [phone] (patients >20yrs). See Note 1 . Note 1 . For additional information, please refer to  http://education.[facility].com/faq/FAQ199  (This link is being provided for  informational/ educational purposes only.)   CBC with Differential/Platelet     Status: Abnormal   Collection Time: 03/15/20 11:42 AM  Result Value Ref Range   WBC 6.6 3.8 - 10.8 Thousand/uL   RBC 4.62 4.20 - 5.80 Million/uL   Hemoglobin 15.5 13.2 - 17.1 g/dL   HCT 44.5 38.5 - 50.0 %   MCV 96.3 80.0 - 100.0 fL   MCH 33.5 (H) 27.0 - 33.0 pg   MCHC 34.8 32.0 - 36.0 g/dL   RDW 12.5 11.0 - 15.0 %   Platelets 117 (L) 140 - 400 Thousand/uL   MPV 12.1 7.5 - 12.5 fL   Neutro Abs 3,722 1,500 - 7,800 cells/uL   Lymphs Abs 2,138 850 - 3,900 cells/uL   Absolute Monocytes 495 200 - 950 cells/uL   Eosinophils Absolute 132 15 - 500 cells/uL   Basophils Absolute 112 0 - 200 cells/uL   Neutrophils Relative % 56.4 %   Total Lymphocyte 32.4 %   Monocytes Relative 7.5 %   Eosinophils Relative 2.0 %   Basophils Relative 1.7 %  Comprehensive metabolic panel     Status: Abnormal   Collection Time: 03/15/20 11:42 AM  Result Value Ref Range   Glucose, Bld 141 (H) 65 - 99 mg/dL    Comment: .            Fasting reference interval . For someone without known diabetes, a glucose value >125 mg/dL indicates that they may have diabetes and this should be confirmed with a follow-up test. .    BUN 10 7 - 25 mg/dL   Creat 1.17 0.70 - 1.25 mg/dL    Comment: For patients >98 years of age, the reference limit for Creatinine is approximately 13% higher for people identified as African-American. .    BUN/Creatinine Ratio NOT APPLICABLE 6 - 22 (calc)   Sodium 137 135 - 146 mmol/L   Potassium 4.4 3.5 - 5.3 mmol/L   Chloride 102 98 - 110 mmol/L   CO2 25 20 - 32 mmol/L   Calcium 9.3 8.6 - 10.3 mg/dL   Total Protein 7.6 6.1 - 8.1 g/dL   Albumin 3.9 3.6 - 5.1 g/dL   Globulin 3.7 1.9 - 3.7 g/dL (calc)   AG Ratio 1.1 1.0 - 2.5 (calc)   Total Bilirubin 1.3 (H) 0.2 - 1.2 mg/dL   Alkaline phosphatase (APISO) 85 35 - 144 U/L   AST 42 (H) 10 - 35 U/L   ALT 23 9 - 46 U/L  Microalbumin / creatinine urine ratio      Status: None   Collection Time: 03/15/20 11:42 AM  Result Value Ref Range   Creatinine, Urine 206 20 - 320 mg/dL   Microalb, Ur 1.3 mg/dL    Comment: Reference Range Not established    Microalb Creat Ratio 6 <30 mcg/mg creat    Comment: . The ADA defines abnormalities in albumin excretion as follows: [name] Albuminuria Category        Result (mcg/mg creatinine) . Normal to Mildly increased   <30 Moderately increased         30-299  Severely increased           > OR = 300 . The ADA recommends that at  least two of three specimens collected within a 3-6 month period be abnormal before considering a patient to be within a diagnostic category.   Lipid panel     Status: Abnormal   Collection Time: 03/15/20 11:42 AM  Result Value Ref Range   Cholesterol 136 <200 mg/dL   HDL 37 (L) > OR = 40 mg/dL   Triglycerides 170 (H) <150 mg/dL   LDL Cholesterol (Calc) 74 mg/dL (calc)    Comment: Reference range: <100 . Desirable range <100 mg/dL for primary prevention;   <70 mg/dL for patients with CHD or diabetic patients  with > or = 2 CHD risk factors. [name] LDL-C is now calculated using the [place]-Hopkins  calculation, which is a validated novel method providing  better accuracy than the Friedewald equation in the  estimation of LDL-C.  Cresenciano Genre et al. Annamaria Helling. 9485;462(70): 2061-2068  (http://education.[facility].com/faq/FAQ164)    Total CHOL/HDL Ratio 3.7 <5.0 (calc)   Non-HDL Cholesterol (Calc) 99 <130 mg/dL (calc)    Comment: For patients with diabetes plus 1 major ASCVD risk  factor, treating to a non-HDL-C goal of <100 mg/dL  (LDL-C of <70 mg/dL) is considered a therapeutic  option.   Hemoglobin A1c     Status: Abnormal   Collection Time: 03/15/20 11:42 AM  Result Value Ref Range   Hgb A1c MFr Bld 7.4 (H) <5.7 % of total Hgb    Comment: For someone without known diabetes, a hemoglobin A1c value of 6.5% or greater indicates that they may have  diabetes and this should be  confirmed with a follow-up  test. . For someone with known diabetes, a value <7% indicates  that their diabetes is well controlled and a value  greater than or equal to 7% indicates suboptimal  control. A1c targets should be individualized based on  duration of diabetes, age, comorbid conditions, and  other considerations. . Currently, no consensus exists regarding use of hemoglobin A1c for diagnosis of diabetes for children. .    Mean Plasma Glucose 166 (calc)   eAG (mmol/L) 9.2 (calc)  Lipid panel     Status: Abnormal   Collection Time: 03/30/20  7:35 AM  Result Value Ref Range   Cholesterol 122 <200 mg/dL   HDL 37 (L) > OR = 40 mg/dL   Triglycerides 120 <150 mg/dL   LDL Cholesterol (Calc) 64 mg/dL (calc)    Comment: Reference range: <100 . Desirable range <100 mg/dL for primary prevention;   <70 mg/dL for patients with CHD or diabetic patients  with > or = 2 CHD risk factors. [name] LDL-C is now calculated using the [place]-Hopkins  calculation, which is a validated novel method providing  better accuracy than the Friedewald equation in the  estimation of LDL-C.  Cresenciano Genre et al. Annamaria Helling. 3500;938(18): 2061-2068  (http://education.[facility].com/faq/FAQ164)    Total CHOL/HDL Ratio 3.3 <5.0 (calc)   Non-HDL Cholesterol (Calc) 85 <130 mg/dL (calc)    Comment: For patients with diabetes plus 1 major ASCVD risk  factor, treating to a non-HDL-C goal of <100 mg/dL  (LDL-C of <70 mg/dL) is considered a therapeutic  option.   COMPLETE METABOLIC PANEL WITH GFR     Status: Abnormal   Collection Time: 03/30/20  7:35 AM  Result Value Ref Range   Glucose, Bld 136 (H) 65 - 99 mg/dL    Comment: .            Fasting reference interval . For someone without known diabetes, a glucose value >125 mg/dL indicates that  they may have diabetes and this should be confirmed with a follow-up test. .    BUN 10 7 - 25 mg/dL   Creat 1.04 0.70 - 1.25 mg/dL    Comment: For patients >71  years of age, the reference limit for Creatinine is approximately 13% higher for people identified as African-American. .    GFR, Est Non African American 76 > OR = 60 mL/min/1.69m2   GFR, Est African American 88 > OR = 60 mL/min/1.76m2   BUN/Creatinine Ratio NOT APPLICABLE 6 - 22 (calc)   Sodium 139 135 - 146 mmol/L   Potassium 4.0 3.5 - 5.3 mmol/L   Chloride 104 98 - 110 mmol/L   CO2 24 20 - 32 mmol/L   Calcium 9.3 8.6 - 10.3 mg/dL   Total Protein 7.3 6.1 - 8.1 g/dL   Albumin 3.8 3.6 - 5.1 g/dL   Globulin 3.5 1.9 - 3.7 g/dL (calc)   AG Ratio 1.1 1.0 - 2.5 (calc)   Total Bilirubin 1.2 0.2 - 1.2 mg/dL   Alkaline phosphatase (APISO) 74 35 - 144 U/L   AST 27 10 - 35 U/L   ALT 13 9 - 46 U/L  VITAMIN D 25 Hydroxy (Vit-D Deficiency, Fractures)     Status: None   Collection Time: 03/30/20  7:35 AM  Result Value Ref Range   Vit D, 25-Hydroxy 68 30 - 100 ng/mL    Comment: Vitamin D Status         25-OH Vitamin D: . Deficiency:                    <20 ng/mL Insufficiency:             20 - 29 ng/mL Optimal:                 > or = 30 ng/mL . For 25-OH Vitamin D testing on patients on  D2-supplementation and patients for whom quantitation  of D2 and D3 fractions is required, the QuestAssureD(TM) 25-OH VIT D, (D2,D3), LC/MS/MS is recommended: order  code [phone] (patients >22yrs). See Note 1 . Note 1 . For additional information, please refer to  http://education.[facility].com/faq/FAQ199  (This link is being provided for informational/ educational purposes only.)   TSH     Status: None   Collection Time: 03/30/20  7:35 AM  Result Value Ref Range   TSH 2.79 0.40 - 4.50 mIU/L  T4, free     Status: None   Collection Time: 03/30/20  7:35 AM  Result Value Ref Range   Free T4 1.6 0.8 - 1.8 ng/dL  PSA     Status: None   Collection Time: 04/19/20  3:18 PM  Result Value Ref Range   Prostatic Specific Antigen 1.55 0.00 - 4.00 ng/mL    Comment: (NOTE) While PSA levels of <=4.0  ng/ml are reported as reference range, some men with levels below 4.0 ng/ml can have prostate cancer and many men with PSA above 4.0 ng/ml do not have prostate cancer.  Other tests such as free PSA, age specific reference ranges, PSA velocity and PSA doubling time may be helpful especially in men less than 1 years old. Performed at [facility], [place] [address], [place], [place] [address]   Testosterone     Status: None   Collection Time: 04/19/20  3:18 PM  Result Value Ref Range   Testosterone 297 264 - 916 ng/dL    Comment: (NOTE) Adult male reference interval is based  on a population of healthy nonobese males (BMI <30) between 39 and 80 years old. [place], [place] [phone]. PMID: [id number]. Performed At: [facility] [place], [place] [id number] Rush Farmer MD UR:[id number]   Hemoglobin A1c     Status: Abnormal   Collection Time: 04/19/20  3:18 PM  Result Value Ref Range   Hgb A1c MFr Bld 7.0 (H) 4.8 - 5.6 %    Comment: (NOTE) Pre diabetes:          5.7%-6.4%  Diabetes:              >6.4%  Glycemic control for   <7.0% adults with diabetes    Mean Plasma Glucose 154.2 mg/dL    Comment: Performed at [facility] [address], [place], [place] [address]  CUP PACEART REMOTE DEVICE CHECK     Status: None   Collection Time: 05/12/20  8:00 AM  Result Value Ref Range   Date Time Interrogation Session [id number]    Pulse Generator Manufacturer [employer]    Pulse Gen Model 2240 Assurity    Pulse Gen Serial Number [id number]    Clinic Name [facility] Pulse Generator Type Implantable Pulse Generator    Implantable Pulse Generator Implant Date [id number]    Implantable Lead Manufacturer [facility]    Implantable Lead Model 1688TC Tendril SDX    Implantable Lead Serial Number [id number]    Implantable Lead Implant Date [id number]    Implantable Lead Location Detail 1 UNKNOWN    Implantable Lead Location [id number]     Implantable Lead Manufacturer [facility]    Implantable Lead Model 1888TC Tendril ST Optim    Implantable Lead Serial Number [id number]    Implantable Lead Implant Date 99371696    Implantable Lead Location Detail 1 UNKNOWN    Implantable Lead Location [id number]    Lead Channel Setting Sensing Sensitivity 2.0 mV   Lead Channel Setting Sensing Adaptation Mode Fixed Pacing    Lead Channel Setting Pacing Amplitude 2.0 V   Lead Channel Setting Pacing Pulse Width 0.5 ms   Lead Channel Setting Pacing Amplitude 2.5 V   Lead Channel Status     Lead Channel Impedance Value 410 ohm   Lead Channel Sensing Intrinsic Amplitude 4.7 mV   Lead Channel Pacing Threshold Amplitude 0.5 V   Lead Channel Pacing Threshold Pulse Width 0.5 ms   Lead Channel Status     Lead Channel Impedance Value 550 ohm   Lead Channel Sensing Intrinsic Amplitude 12.0 mV   Lead Channel Pacing Threshold Amplitude 0.75 V   Lead Channel Pacing Threshold Pulse Width 0.5 ms   Battery Status MOS    Battery Remaining Longevity 120 mo   Battery Remaining Percentage 95.5 %   Battery Voltage 2.96 V   Brady Statistic RA Percent Paced 7.5 %   Brady Statistic RV Percent Paced 1.0 %   Brady Statistic AP VP Percent 1.0 %   Brady Statistic AS VP Percent 1.0 %   Brady Statistic AP VS Percent 7.5 %   Brady Statistic AS VS Percent 92.0 %   Eval Rhythm SR at 62 bpm   Comprehensive metabolic panel     Status: Abnormal   Collection Time: 05/13/20 11:15 AM  Result Value Ref Range   Sodium 136 135 - 145 mmol/L   Potassium 4.1 3.5 - 5.1 mmol/L   Chloride 104 98 - 111 mmol/L   CO2 23 22 - 32 mmol/L   Glucose, Bld  111 (H) 70 - 99 mg/dL    Comment: Glucose reference range applies only to samples taken after fasting for at least 8 hours.   BUN 14 8 - 23 mg/dL   Creatinine, Ser 1.08 0.61 - 1.24 mg/dL   Calcium 9.0 8.9 - 10.3 mg/dL   Total Protein 7.8 6.5 - 8.1 g/dL   Albumin 3.8 3.5 - 5.0 g/dL   AST 32 15 - 41 U/L   ALT 16 0 - 44 U/L    Alkaline Phosphatase 66 38 - 126 U/L   Total Bilirubin 1.1 0.3 - 1.2 mg/dL   GFR, Estimated >60 >60 mL/min    Comment: (NOTE) Calculated using the CKD-EPI Creatinine Equation (2021)    Anion gap 9 5 - 15    Comment: Performed at [facility], [address], [place], [place] [address]  CBC with Differential     Status: Abnormal   Collection Time: 05/13/20 11:15 AM  Result Value Ref Range   WBC 5.2 4.0 - 10.5 K/uL   RBC 4.36 4.22 - 5.81 MIL/uL   Hemoglobin 14.8 13.0 - 17.0 g/dL   HCT 43.7 39.0 - 52.0 %   MCV 100.2 (H) 80.0 - 100.0 fL   MCH 33.9 26.0 - 34.0 pg   MCHC 33.9 30.0 - 36.0 g/dL   RDW 13.5 11.5 - 15.5 %   Platelets 116 (L) 150 - 400 K/uL    Comment: PLATELET COUNT CONFIRMED BY SMEAR SPECIMEN CHECKED FOR CLOTS    nRBC 0.0 0.0 - 0.2 %   Neutrophils Relative % 49 %   Neutro Abs 2.6 1.7 - 7.7 K/uL   Lymphocytes Relative 39 %   Lymphs Abs 2.0 0.7 - 4.0 K/uL   Monocytes Relative 9 %   Monocytes Absolute 0.5 0.1 - 1.0 K/uL   Eosinophils Relative 2 %   Eosinophils Absolute 0.1 0.0 - 0.5 K/uL   Basophils Relative 1 %   Basophils Absolute 0.1 0.0 - 0.1 K/uL   Immature Granulocytes 0 %   Abs Immature Granulocytes 0.01 0.00 - 0.07 K/uL    Comment: Performed at [facility], [address], [place], [place] [address]  PSA     Status: None   Collection Time: 05/13/20 11:15 AM  Result Value Ref Range   Prostatic Specific Antigen 0.26 0.00 - 4.00 ng/mL    Comment: (NOTE) While PSA levels of <=4.0 ng/ml are reported as reference range, some men with levels below 4.0 ng/ml can have prostate cancer and many men with PSA above 4.0 ng/ml do not have prostate cancer.  Other tests such as free PSA, age specific reference ranges, PSA velocity and PSA doubling time may be helpful especially in men less than 89 years old. Performed at [facility], [place] [address] [place], [place] [address]   PSA     Status: None   Collection Time: 05/20/20  9:27 AM  Result Value Ref Range    Prostatic Specific Antigen 0.15 0.00 - 4.00 ng/mL    Comment: (NOTE) While PSA levels of <=4.0 ng/ml are reported as reference range, some men with levels below 4.0 ng/ml can have prostate cancer and many men with PSA above 4.0 ng/ml do not have prostate cancer.  Other tests such as free PSA, age specific reference ranges, PSA velocity and PSA doubling time may be helpful especially in men less than 64 years old. Performed at [facility], [place] [address], [place], [place] [address]   CBC with Differential  Status: Abnormal   Collection Time: 05/20/20  9:27 AM  Result Value Ref Range   WBC 4.7 4.0 - 10.5 K/uL   RBC 4.20 (L) 4.22 - 5.81 MIL/uL   Hemoglobin 14.5 13.0 - 17.0 g/dL   HCT 42.4 39.0 - 52.0 %   MCV 101.0 (H) 80.0 - 100.0 fL   MCH 34.5 (H) 26.0 - 34.0 pg   MCHC 34.2 30.0 - 36.0 g/dL   RDW 13.3 11.5 - 15.5 %   Platelets 101 (L) 150 - 400 K/uL    Comment: SPECIMEN CHECKED FOR CLOTS Immature Platelet Fraction may be clinically indicated, consider ordering this additional test [id number] CONSISTENT WITH PREVIOUS RESULT    nRBC 0.0 0.0 - 0.2 %   Neutrophils Relative % 52 %   Neutro Abs 2.4 1.7 - 7.7 K/uL   Lymphocytes Relative 37 %   Lymphs Abs 1.7 0.7 - 4.0 K/uL   Monocytes Relative 8 %   Monocytes Absolute 0.4 0.1 - 1.0 K/uL   Eosinophils Relative 2 %   Eosinophils Absolute 0.1 0.0 - 0.5 K/uL   Basophils Relative 1 %   Basophils Absolute 0.1 0.0 - 0.1 K/uL   Immature Granulocytes 0 %   Abs Immature Granulocytes 0.00 0.00 - 0.07 K/uL    Comment: Performed at [facility], [address] [place], [place] [address]  Comprehensive metabolic panel     Status: Abnormal   Collection Time: 05/20/20  9:27 AM  Result Value Ref Range   Sodium 133 (L) 135 - 145 mmol/L   Potassium 3.9 3.5 - 5.1 mmol/L   Chloride 103 98 - 111 mmol/L   CO2 20 (L) 22 - 32 mmol/L   Glucose, Bld 215 (H) 70 - 99 mg/dL    Comment: Glucose reference range applies only to samples taken after  fasting for at least 8 hours.   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 Patient has had problems with consistency of taking medications. Therefore given the normal free T4, advised him to continue at same dose of levothyroxine at 200 mcg p.o. every morning before breakfast. Repeat thyroid function test will be done prior to next visit.   - We discussed about the correct intake of his thyroid hormone, on empty stomach at fasting, with water, separated by at least 30 minutes from breakfast and other medications,  and separated by more than 4 hours from calcium, iron, multivitamins, acid reflux medications (PPIs). -Patient is made aware of the fact that thyroid hormone replacement is needed for life, dose to be adjusted by periodic monitoring of thyroid function tests.   2. Diabetes mellitus type 2 in obese [facility])  He presents today  for a follow up due to increasing glucose readings since he started his Erleada for prostate cancer treatment.  Initially, he had readings above 500, but most recently his glucose has leveled back out to normal ranges.  He denies any hypoglycemia.  - He is advised to continue Metformin 500 mg p.o. twice daily with meals and Glipizide to 5 mg XL daily with breakfast.   -He is encouraged to monitor glucose 1-2 times per day, before breakfast and before bed, and  call the clinic if he has readings less than 70 or greater than 300 for 3 tests in a row.  -He will be considered for incretin therapy on subsequent visits.  - Nutritional counseling repeated at each appointment due to patients tendency to fall back in to old habits.  - The patient admits there is a room for improvement in their diet and drink choices. -  Suggestion is made for the patient to avoid simple carbohydrates from their diet including Cakes, Sweet Desserts / Pastries, Ice Cream, Soda (diet and regular), Sweet Tea, Candies, Chips, Cookies, Sweet Pastries,  Store Bought Juices, Alcohol in Excess of  1-2 drinks a day, Artificial Sweeteners, Coffee Creamer, and "Sugar-free" Products. This will help patient to have stable blood glucose profile and potentially avoid unintended weight gain.   - I encouraged the patient to switch to  unprocessed or minimally processed complex starch and increased protein intake (animal or plant source), fruits, and vegetables.   - Patient is advised to stick to a routine mealtimes to eat 3 meals  a day and avoid unnecessary snacks ( to snack only to correct hypoglycemia).  3. Essential hypertension His blood pressure is controlled to target (slightly low this visit).  He did report some associated dizziness with it this morning.  He only took 20 mg of his Lasix this morning.  He is advised to monitor his BP at home and call his oncologist if BP remains low (he just had an injection of Firmagon yesterday).  He is advised to continue Lasix 60 mg po daily.  4.  Hyperlipidemia:  His most recent lipid panel from 03/30/20 shows controlled LDL at 64.  He is advised to continue Simvastatin 40 mg po daily at bedtime.  Side effects and precautions discussed with him.  5. Vitamin D Deficiency: His most recent vitamin D level on 03/30/20 shows improvement to 68.  He has completed replenishment with ergocalciferol.  No need for additional supplementation at this  time.   - I advised patient to maintain close follow up with [name], MD for primary care needs.   - Time spent on this patient care encounter:  30 min, of which > 50% was spent in  counseling and the rest reviewing his blood glucose logs , discussing his hypoglycemia and hyperglycemia episodes, reviewing his current and  previous labs / studies  ( including abstraction from other facilities) and medications  doses and developing a  long term treatment plan and documenting his care.   Please refer to Patient Instructions for Blood Glucose Monitoring and Insulin/Medications Dosing Guide"  in media tab for additional information. Please  also refer to " Patient Self Inventory" in the Media  tab for reviewed elements of pertinent patient history.  [name] participated in the discussions, expressed understanding, and voiced agreement with the above plans.  All questions were answered to his satisfaction. he is encouraged to contact clinic should he have any questions or concerns prior to his return  visit.  Follow up plan: No follow-ups on file.  Rayetta Pigg, [employer] [facility] [address] [place], [place] [address] Phone: [phone] Fax: [phone]   05/21/2020, 10:47 AM

## 2020-05-21 NOTE — Chronic Care Management (AMB) (Signed)
Chronic Care Management   Follow Up Note   05/25/2020 Name: Peter Dunlap MRN: [id number] DOB: 1955-11-11  Referred by: [name], MD Reason for referral : No chief complaint on file.   Peter Dunlap is a 64 y.o. year old male who is a primary care patient of [place], [name], MD. The CCM team was consulted for assistance with chronic disease management and care coordination needs.    Review of patient status, including review of consultants reports, relevant laboratory and other test results, and collaboration with appropriate care team members and the patient's provider was performed as part of comprehensive patient evaluation and provision of chronic care management services.    SDOH (Social Determinants of Health) assessments performed: No See Care Plan activities for detailed interventions related to [facility])     Outpatient Encounter Medications as of 05/21/2020  Medication Sig  . allopurinol (ZYLOPRIM) 100 MG tablet TAKE 1 TABLET(100 MG) BY MOUTH DAILY  . aspirin EC 325 MG tablet Take 1 tablet (325 mg total) daily by mouth.  . Blood Glucose Monitoring Suppl (BLOOD GLUCOSE SYSTEM PAK) KIT Use as directed to monitor FSBS 1x daily. Dx: E11.9.  [name] colchicine 0.6 MG tablet   . Cyanocobalamin (VITAMIN B 12) 500 MCG TABS Take by mouth.  . DEXILANT 60 MG capsule TAKE 1 CAPSULE(60 MG) BY MOUTH DAILY  . ERLEADA 60 MG tablet TAKE 4 TABLETS (240 MG TOTAL) BY MOUTH DAILY. MAY BE TAKEN WITH OR WITHOUT FOOD. SWALLOW TABLETS WHOLE.  . furosemide (LASIX) 20 MG tablet TAKE 3 TABLETS BY MOUTH DAILY  . glipiZIDE (GLUCOTROL XL) 5 MG 24 hr tablet TAKE 1 TABLET(5 MG) BY MOUTH DAILY WITH BREAKFAST  . isosorbide mononitrate (IMDUR) 60 MG 24 hr tablet TAKE 1 TABLET BY MOUTH DAILY  . levothyroxine (SYNTHROID) 200 MCG tablet TAKE 1 TABLET(200 MCG) BY MOUTH DAILY BEFORE BREAKFAST  . loratadine (CLARITIN) 10 MG tablet TAKE 1 TABLET(10 MG) BY MOUTH DAILY AS NEEDED FOR ALLERGIES  . Magnesium  Oxide 400 (240 Mg) MG TABS TAKE 1 TABLET BY MOUTH TWICE DAILY  . metFORMIN (GLUCOPHAGE) 500 MG tablet TAKE 1 TABLET BY MOUTH TWICE DAILY  . nitroGLYCERIN (NITROSTAT) 0.4 MG SL tablet PLACE 1 TABLET UNDER THE TONGUE EVERY 5 MINUTES AS NEEDED FOR CHEST PAIN. CALL 911 AT THIRD DOSE IN 15 MINUTES  . ONETOUCH ULTRA test strip USE TO TEST ONCE DAILY  . oxyCODONE-acetaminophen (PERCOCET) 10-325 MG tablet Take 1 tablet by mouth every 8 (eight) hours as needed for pain.  . pantoprazole (PROTONIX) 40 MG tablet Take 1 tablet (40 mg total) by mouth daily.  . potassium chloride SA (KLOR-CON) 20 MEQ tablet Take 1 tablet (20 mEq total) by mouth daily.  . silodosin (RAPAFLO) 8 MG CAPS capsule Take 1 capsule (8 mg total) by mouth 2 (two) times daily.  [name] SIMVASTATIN PO   . sotalol (BETAPACE) 160 MG tablet TAKE 1 TABLET(160 MG) BY MOUTH TWICE DAILY  . SOTALOL HCL PO   . Vitamin D, Ergocalciferol, (DRISDOL) 1.25 MG (50000 UNIT) CAPS capsule TAKE 1 CAPSULE BY MOUTH EVERY 7 DAYS   No facility-administered encounter medications on file as of 05/21/2020.   Reviewed chart ahead of call on diabetes management. 05/21/2020 (Endo) - no medication changes, regular follow up 04/02/20 (Endo) - A1c was controlled at 7.0.  Patient reports his weight has been decreasing rapidly.  He denies any hypoglycemia.  He is unfortunately dealing with prostate cancer.  Recent Relevant Labs: Lab Results  Component Value Date/Time   HGBA1C 7.0 (H) 04/19/2020 03:18 PM   HGBA1C 7.4 (H) 03/15/2020 11:42 AM   HGBA1C 6.1 06/26/2015 12:00 AM   HGBA1C 4.8 04/21/2014 09:47 AM   MICROALBUR 1.3 03/15/2020 11:42 AM   MICROALBUR 4.1 11/05/2015 09:06 AM    Kidney Function Lab Results  Component Value Date/Time   CREATININE 1.20 05/20/2020 09:27 AM   CREATININE 1.08 05/13/2020 11:15 AM   CREATININE 1.04 03/30/2020 07:35 AM   CREATININE 1.17 03/15/2020 11:42 AM   CREATININE 1.2 04/17/2016 11:53 AM   GFR 69.60 08/27/2014 11:48 AM   GFRNONAA  >60 05/20/2020 09:27 AM   GFRNONAA 76 03/30/2020 07:35 AM   GFRAA 88 03/30/2020 07:35 AM    . Current antihyperglycemic regimen:  o Metformin $RemoveBef'500mg'[id number]$  twice daily o Glipizide XL $RemoveBefo'5mg'[id number]$  daily . What recent interventions/DTPs have been made to improve glycemic control:  o None . Have there been any recent hospitalizations or ED visits since last visit with CPP? No . Patient denies hypoglycemic symptoms, including Sweaty, Shaky, Hungry, Nervous/irritable and Vision changes . Patient denies hyperglycemic symptoms, including blurry vision, excessive thirst, fatigue, polyuria and weakness . How often are you checking your blood sugar? Once or twice daily . What are your blood sugars ranging?  o Fasting: 120s o No readings > 200 . During the week, how often does your blood glucose drop below 70? Never . Are you checking your feet daily/regularly? Yes  Adherence Review: Is the patient currently on a STATIN medication? Yes Is the patient currently on ACE/ARB medication? No Does the patient have >5 day gap between last estimated fill dates? No   Patient reports he had a spell where his sugars were out of range and > 500.  He has now gotten them back under control and his numbers are normal.  Patient requests a refill on his pain medication which is not due to the 24th, however due to the holiday upcoming I informed him that I would reach out to PCP so that we get ahead of things so he does not have to go without due to the holidays.  Beverly Milch, PharmD Clinical Pharmacist [place] [phone]

## 2020-05-23 DIAGNOSIS — Z1379 Encounter for other screening for genetic and chromosomal anomalies: Secondary | ICD-10-CM | POA: Insufficient documentation

## 2020-05-24 NOTE — Progress Notes (Signed)
Remote pacemaker transmission.   

## 2020-05-25 ENCOUNTER — Other Ambulatory Visit: Payer: Self-pay | Admitting: *Deleted

## 2020-05-25 MED ORDER — OXYCODONE-ACETAMINOPHEN 10-325 MG PO TABS: 1.0000 | ORAL_TABLET | Freq: Three times a day (TID) | ORAL | 0 refills | Status: DC | PRN

## 2020-05-25 NOTE — Telephone Encounter (Signed)
Ok to refill??  Last office visit 04/21/2020.  Last refill 04/28/2020.

## 2020-05-25 NOTE — Telephone Encounter (Signed)
-----   Message from Edythe Clarity, [facility] sent at 05/25/2020 10:44 AM EST ----- Patient wanted to go ahead and put in a request for his Oxycodone to be called in to [facility] whenever possible due to the upcoming holiday.   Thanks!  Beverly Milch, PharmD Clinical Pharmacist [place] [phone]

## 2020-05-31 ENCOUNTER — Telehealth: Payer: Self-pay | Admitting: Genetic Counselor

## 2020-05-31 NOTE — Telephone Encounter (Signed)
LVM that his genetic test results are available and requested that he call back to discuss them.  

## 2020-06-01 ENCOUNTER — Encounter: Payer: Self-pay | Admitting: Genetic Counselor

## 2020-06-01 ENCOUNTER — Telehealth: Payer: Self-pay | Admitting: Family Medicine

## 2020-06-01 ENCOUNTER — Other Ambulatory Visit: Payer: Self-pay

## 2020-06-01 ENCOUNTER — Encounter: Payer: Self-pay | Admitting: Urology

## 2020-06-01 ENCOUNTER — Ambulatory Visit: Payer: Self-pay | Admitting: Genetic Counselor

## 2020-06-01 ENCOUNTER — Ambulatory Visit (INDEPENDENT_AMBULATORY_CARE_PROVIDER_SITE_OTHER): Payer: Medicare Other | Admitting: Urology

## 2020-06-01 VITALS — BP 151/89 | HR 71 | Temp 98.4°F | Ht 72.0 in | Wt 310.0 lb

## 2020-06-01 DIAGNOSIS — N401 Enlarged prostate with lower urinary tract symptoms: Secondary | ICD-10-CM | POA: Diagnosis not present

## 2020-06-01 DIAGNOSIS — R350 Frequency of micturition: Secondary | ICD-10-CM

## 2020-06-01 DIAGNOSIS — C61 Malignant neoplasm of prostate: Secondary | ICD-10-CM | POA: Diagnosis not present

## 2020-06-01 DIAGNOSIS — Z1379 Encounter for other screening for genetic and chromosomal anomalies: Secondary | ICD-10-CM

## 2020-06-01 LAB — URINALYSIS, ROUTINE W REFLEX MICROSCOPIC
Bilirubin, UA: NEGATIVE
Glucose, UA: NEGATIVE
Ketones, UA: NEGATIVE
Nitrite, UA: NEGATIVE
Protein,UA: NEGATIVE
Specific Gravity, UA: 1.015 (ref 1.005–1.030)
Urobilinogen, Ur: 1 mg/dL (ref 0.2–1.0)
pH, UA: 5.5 (ref 5.0–7.5)

## 2020-06-01 LAB — MICROSCOPIC EXAMINATION
Bacteria, UA: NONE SEEN
Epithelial Cells (non renal): NONE SEEN /hpf (ref 0–10)
Renal Epithel, UA: NONE SEEN /hpf

## 2020-06-01 NOTE — Progress Notes (Signed)
H&P  Chief Complaint: PCa  History of Present Illness:   12.28.2021: Pt tolerating his ADT and apalutamide regimen well. He notes a tenderness at his firmagon injection site (monthly - $RemoveBe'80mg'ikupTeHFu$ ). He denies any significant discomfort or change in LUTS at this time.  (below copied from [facility] records):  Hematuria: He first noticed the symptoms approximately 10/27/2016.   Prior to his 5.29.2018 appointment he began having dysuria and gross hematuria, initially in his stream. It was felt that he had a urinary tract infection. Urine culture was negative, but he did clear with an antibiotic.   9.15.2020: He has only had one instance of hematuria since last visit, which occurred last month. He states that he had a single clot per his urine that had a little bit of blood afterwards,  Prostate cancer: He underwent TRUS/Bx on 8.15.2017. At that time, PSA was 9.09. Prostatic volume was 25 cc. 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   9.15.2020: PSA 0.8.  BPH: Patient is currently treated with Rapaflo for his symptoms.   4.16.2019: He still has moderate voiding symptomatology despite being on Rapaflo. No gross hematuria or dysuria, however.  IPSS 25  Rapaflo increased to BID.   10.22.2019: Current IPSS 27 QOl score 2--only on 1 Rapaflo a day   1.27.2020: The patient is still having significant lower urinary tract symptoms. Rapaflo increased to Q 12 hrs   5.12.2020: IPSS is 21. Quality of life score 2. He is on generic Rapaflo twice a day.   9.15.2020: He reports that his only major urinary issue is that his stream is somewhat slow to start. He also notes that he has occasional dysuria but this is not too bothersome for him. He continues on tamsulosin 2x a day. He has variable nocturia  depending on his evening fluid intake.  8.3.2021:Pt reports recent/persistentgross hematuria - beginning of stream but notes that his FOS is strong. Pt reports no other significant concerns at this time.  PSA checked in June at the time of his visit was up to 1.4, representing at least a second or third PSA increase.  9.7.2021: He is here to discuss his biopsy results.  1/12 cores-from the left mid lateral region-came back positive for adenocarcinoma.  Grading was difficult due to his prior radiation.  He has done fairly well since his biopsy.  He does state that he needs a refill of his alpha-blocker.  Past Medical History:  Diagnosis Date  . Arteriosclerotic cardiovascular disease (ASCVD) 2005   catheterization in 10/2010:50% mid LAD, diffuse distal disease, circumflex irregularities, large dominant RCA with a 50% ostial, 70% distal, 60% posterolateral and 70% PDA; normal EF  . Arthritis   . Benign prostatic hypertrophy   . Bilateral carpal tunnel syndrome 07/03/2018  . Cerebrovascular disease 2010   R. carotid endarterectomy; Duplex in 10/2010-widely patent ICAs, subtotal left vertebral-not thought to be contributing to symptoms  . Cervical spine disease    CT in 2012-advanced degeneration and spondylosis with moderate spinal stenosis at C3-C6  . CHF (congestive heart failure) ([place])   . Depression   . Diabetes mellitus without complication ([place])   . Erectile dysfunction   . Family history of breast cancer   . Family history of cancer of mouth   . Family history of  CML (chronic monocytic leukemia)   . Family history of lung cancer   . Family history of ovarian cancer   . Family history of stomach cancer   . Gastroesophageal reflux disease   . H/O hiatal hernia   . H/O: substance abuse ([place])    Cocaine, marijuana, alcohol.  Quit 2013.   [name] Hyperlipidemia   . Hypertension   . Non-ST elevation myocardial infarction (NSTEMI), initial episode of care [facility]) 12/02/2013   DES LAD   . Obesity   . Prostate cancer ([place])   . Sleep apnea    CPAP  . Tachy-brady syndrome ([place])    a. s/p STJ dual chamber PPM   . Thyroid disease   . Tobacco abuse    Quit 2014  . 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 BRAIN SURGERY  2015   hematoma evacuation  . BURR HOLE Right 04/13/2014   Procedure: Haskell Flirt;  Surgeon: Charlie Pitter, MD;  Location: [place] [facility] NEURO ORS;  Service: Neurosurgery;  Laterality: Right;  . CAROTID ENDARTERECTOMY Right Feb. 25, 2010    CEA  . CORONARY ANGIOPLASTY WITH STENT PLACEMENT  12/03/2013   LAD 90%-->0% W/ Promus Premier DES 3.0 mm x 16 mm, CFX OK, RCA 40%, EF 70-75%  . LEFT ATRIAL APPENDAGE OCCLUSION N/A 08/05/2015   Procedure: LEFT ATRIAL APPENDAGE OCCLUSION;  Surgeon: [name], MD;  Location: [place] CV LAB;  Service: Cardiovascular;  Laterality: N/A;  . LEFT HEART CATHETERIZATION WITH CORONARY ANGIOGRAM Left 12/03/2013   Procedure: LEFT HEART CATHETERIZATION WITH CORONARY ANGIOGRAM;  Surgeon: Leonie Man, MD;  Location: [facility] CATH LAB;  Service: Cardiovascular;  Laterality: Left;  . LEFT HEART CATHETERIZATION WITH CORONARY ANGIOGRAM N/A 01/26/2014   Procedure: LEFT HEART CATHETERIZATION WITH CORONARY ANGIOGRAM;  Surgeon: Jettie Booze, MD;  Location: [facility] CATH LAB;  Service: Cardiovascular;  Laterality: N/A;  . LEFT HEART CATHETERIZATION WITH CORONARY ANGIOGRAM N/A 08/03/2014   Procedure: LEFT HEART CATHETERIZATION WITH CORONARY ANGIOGRAM;  Surgeon: Burnell Blanks, MD;  Location: [facility] CATH LAB;  Service: Cardiovascular;  Laterality: N/A;  . PERCUTANEOUS CORONARY STENT INTERVENTION (PCI-S)  12/03/2013   Procedure: PERCUTANEOUS CORONARY STENT INTERVENTION (PCI-S);  Surgeon: Leonie Man, MD;  Location: [facility] CATH LAB;  Service: Cardiovascular;;  . PERMANENT PACEMAKER INSERTION N/A 09/18/2014   Procedure: PERMANENT PACEMAKER INSERTION;  Surgeon: [name], MD;  Location: [facility] CATH  LAB;  Service: Cardiovascular;  Laterality: N/A;  . RADIOFREQUENCY ABLATION  2005   for PSVT  . TEE WITHOUT CARDIOVERSION N/A 07/27/2015   Procedure: TRANSESOPHAGEAL ECHOCARDIOGRAM (TEE);  Surgeon: Lelon Perla, MD;  Location: [facility] ENDOSCOPY;  Service: Cardiovascular;  Laterality: N/A;  . TEE WITHOUT CARDIOVERSION N/A 09/15/2015   Procedure: TRANSESOPHAGEAL ECHOCARDIOGRAM (TEE);  Surgeon: Thayer Headings, MD;  Location: [place];  Service: Cardiovascular;  Laterality: N/A;    Home Medications:  Allergies as of 06/01/2020      Reactions   Trazodone And Nefazodone    Nightmares   Lactose Intolerance (gi) Other (See Comments)   UPSET STOMACH      Medication List       Accurate as of June 01, 2020 12:23 PM. If you have any questions, ask your nurse or doctor.        allopurinol 100 MG tablet Commonly known as: ZYLOPRIM TAKE 1 TABLET(100 MG) BY MOUTH DAILY   aspirin EC 325 MG tablet Take 1 tablet (325 mg  total) daily by mouth.   Blood Glucose System Pak Kit Use as directed to monitor FSBS 1x daily. Dx: E11.9.   colchicine 0.6 MG tablet   Dexilant 60 MG capsule Generic drug: dexlansoprazole TAKE 1 CAPSULE(60 MG) BY MOUTH DAILY   Erleada 60 MG tablet Generic drug: apalutamide TAKE 4 TABLETS (240 MG TOTAL) BY MOUTH DAILY. MAY BE TAKEN WITH OR WITHOUT FOOD. SWALLOW TABLETS WHOLE.   furosemide 20 MG tablet Commonly known as: LASIX TAKE 3 TABLETS BY MOUTH DAILY   glipiZIDE 5 MG 24 hr tablet Commonly known as: GLUCOTROL XL TAKE 1 TABLET(5 MG) BY MOUTH DAILY WITH BREAKFAST   isosorbide mononitrate 60 MG 24 hr tablet Commonly known as: IMDUR TAKE 1 TABLET BY MOUTH DAILY   levothyroxine 200 MCG tablet Commonly known as: SYNTHROID TAKE 1 TABLET(200 MCG) BY MOUTH DAILY BEFORE BREAKFAST   loratadine 10 MG tablet Commonly known as: CLARITIN TAKE 1 TABLET(10 MG) BY MOUTH DAILY AS NEEDED FOR ALLERGIES   Magnesium Oxide 400 (240 Mg) MG Tabs TAKE 1 TABLET BY MOUTH  TWICE DAILY   metFORMIN 500 MG tablet Commonly known as: GLUCOPHAGE TAKE 1 TABLET BY MOUTH TWICE DAILY   nitroGLYCERIN 0.4 MG SL tablet Commonly known as: NITROSTAT PLACE 1 TABLET UNDER THE TONGUE EVERY 5 MINUTES AS NEEDED FOR CHEST PAIN. CALL 911 AT THIRD DOSE IN 15 MINUTES   OneTouch Ultra test strip Generic drug: glucose blood USE TO TEST ONCE DAILY   oxyCODONE-acetaminophen 10-325 MG tablet Commonly known as: PERCOCET Take 1 tablet by mouth every 8 (eight) hours as needed for pain.   pantoprazole 40 MG tablet Commonly known as: PROTONIX Take 1 tablet (40 mg total) by mouth daily.   silodosin 8 MG Caps capsule Commonly known as: RAPAFLO Take 1 capsule (8 mg total) by mouth 2 (two) times daily.   SIMVASTATIN PO   SOTALOL HCL PO   sotalol 160 MG tablet Commonly known as: BETAPACE TAKE 1 TABLET(160 MG) BY MOUTH TWICE DAILY   Vitamin B 12 500 MCG Tabs Take by mouth.   Vitamin D (Ergocalciferol) 1.25 MG (50000 UNIT) Caps capsule Commonly known as: DRISDOL TAKE 1 CAPSULE BY MOUTH EVERY 7 DAYS       Allergies:  Allergies  Allergen Reactions  . Trazodone And Nefazodone     Nightmares  . Lactose Intolerance (Gi) Other (See Comments)    UPSET STOMACH     Family History  Problem Relation Age of Onset  . Hypertension Mother        Cerebrovascular disease  . Diabetes Mother   . Coronary artery disease Father 83  . Diabetes type II Father   . Hypertension Father   . Heart attack Father   . Diabetes Brother   . Hypertension Brother   . Diabetes Sister   . Hypertension Sister   . Heart attack Sister 14  . Leukemia Sister 25       CML  . Breast cancer Maternal Grandmother        dx 26s  . Lung cancer Maternal Uncle        dx >50, smoker  . Cancer Cousin        mouth cancer, dx 9s, no smoking/chew tobacco hx (maternal 1st cousin)  . Ovarian cancer Cousin        dx <50 (maternal 1st cousin)  . Stomach cancer Cousin        dx 23s (maternal 1st cousin)   . Cancer Cousin  type unknown to pt, dx >50 (paternal 1st cousin)    Social History:  reports that he quit smoking about 7 years ago. His smoking use included cigarettes. He started smoking about 47 years ago. He has a 40.00 pack-year smoking history. He has never used smokeless tobacco. He reports current alcohol use. He reports that he does not use drugs.  ROS: A complete review of systems was performed.  All systems are negative except for pertinent findings as noted.  Physical Exam:  Vital signs in last 24 hours: There were no vitals taken for this visit. Constitutional:  Alert and oriented, No acute distress Neurologic: Grossly intact, no focal deficits Psychiatric: Normal mood and affect  I have reviewed prior pt notes  I have reviewed notes from referring/previous physicians  I have reviewed urinalysis results  I have independently reviewed prior imaging  I have reviewed prior PSA/testosterone results   Impression/Assessment:  Recurrent castrate sensitive prostate cancer, metastatic, now on combination therapy which he is tolerating well  Plan:  1.  Testosterone level was drawn today.  2. F/U in 6 months for OV and symptom recheck. Pt will call if he experiences any worsening LUTS.  3.  As he sees Dr. Delton Coombes regularly, I will see him on an intermittent basis as above  CC: Dr. [name] & Dr. Delton Coombes

## 2020-06-01 NOTE — Progress Notes (Signed)
HPI:  Peter Dunlap was previously seen in the [facility] due to a personal and family history of cancer and concerns regarding a hereditary predisposition to cancer. Please refer to our prior cancer genetics clinic note for more information regarding our discussion, assessment and recommendations, at the time. Peter Dunlap recent genetic test results were disclosed to him, as were recommendations warranted by these results. These results and recommendations are discussed in more detail below.  CANCER HISTORY:  Oncology History  Prostate cancer (HCC)  03/10/2016 Initial Diagnosis   Prostate cancer (HCC)   05/23/2020 Genetic Testing   Negative genetic testing:  No pathogenic variants detected on the [facility] Common Hereditary Cancers Panel + Prostate Cancer HRR Panel. A variant of uncertain significance (VUS) was detected in the NF1 gene called c.1178A>G. The report date is 05/23/2020.  The Common Hereditary Cancers Panel offered by [facility] includes sequencing and/or deletion duplication testing of the following 47 genes: APC, ATM, AXIN2, BARD1, BMPR1A, BRCA1, BRCA2, BRIP1, CDH1, CDK4, CDKN2A (p14ARF), CDKN2A (p16INK4a), CHEK2, CTNNA1, DICER1, EPCAM (Deletion/duplication testing only), GREM1 (promoter region deletion/duplication testing only), KIT, MEN1, MLH1, MSH2, MSH3, MSH6, MUTYH, NBN, NF1, NTHL1, PALB2, PDGFRA, PMS2, POLD1, POLE, PTEN, RAD50, RAD51C, RAD51D, SDHB, SDHC, SDHD, SMAD4, SMARCA4. STK11, TP53, TSC1, TSC2, and VHL.  The following genes were evaluated for sequence changes only: SDHA and HOXB13 c.251G>A variant only. The Prostate Cancer HRR Panel offered by [facility] includes sequencing and/or deletion duplication analysis of the following 10 genes: ATM, BARD1, BRCA1, BRCA2, BRIP1, CHEK2, FANCL, PALB2, RAD51C, RAD51D.     FAMILY HISTORY:  We obtained a detailed, 4-generation family history.  Significant diagnoses are listed below: Family History  Problem Relation Age of  Onset  . Hypertension Mother        Cerebrovascular disease  . Diabetes Mother   . Coronary artery disease Father 43  . Diabetes type II Father   . Hypertension Father   . Heart attack Father   . Diabetes Brother   . Hypertension Brother   . Diabetes Sister   . Hypertension Sister   . Heart attack Sister 68  . Leukemia Sister 60       CML  . Breast cancer Maternal Grandmother        dx 43s  . Lung cancer Maternal Uncle        dx >50, smoker  . Cancer Cousin        mouth cancer, dx 54s, no smoking/chew tobacco hx (maternal 1st cousin)  . Ovarian cancer Cousin        dx <50 (maternal 1st cousin)  . Stomach cancer Cousin        dx 56s (maternal 1st cousin)  . Cancer Cousin        type unknown to pt, dx >50 (paternal 1st cousin)   Peter Dunlap does not have children. He has three full-sisters, one full-brother, and two paternal half-brothers. All of his brothers are deceased. One sister was diagnosed with chronic leukemia at age 56.   Peter Dunlap mother died at age 59 and did not have cancer. He had three maternal aunts and five maternal uncles. One uncle had lung cancer diagnosed older than 40 and was a smoker. His maternal grandmother died in her 10s and had breast cancer (diagnosed in her 73s), and his maternal grandfather died in his 34s without cancer. Three maternal cousins have had cancer - one had mouth cancer diagnosed in her 75s without a smoking/chewing tobacco history, a second  cousin had ovarian cancer diagnosed younger than 30, and the third died from stomach cancer in his 44s.   Peter Dunlap father died in his 58s without cancer. Peter Dunlap had five paternal uncles and two paternal aunts. He does not know anything about his paternal grandparents. There is one paternal cousin who had cancer (type unknown to patient), diagnosed older than 6.   Peter Dunlap is unaware of previous family history of genetic testing for hereditary cancer risks. Patient's maternal ancestors are  of [place] American descent, and paternal ancestors are of [place] and [place] descent. There is no reported Ashkenazi Jewish ancestry. There is no known consanguinity.  GENETIC TEST RESULTS: Genetic testing reported out on 05/23/2020 through the [facility] Common Hereditary Cancers Panel + Prostate Cancer HRR Panel. No pathogenic variants were detected.   The Common Hereditary Cancers Panel offered by [facility] includes sequencing and/or deletion duplication testing of the following 47 genes: APC, ATM, AXIN2, BARD1, BMPR1A, BRCA1, BRCA2, BRIP1, CDH1, CDK4, CDKN2A (p14ARF), CDKN2A (p16INK4a), CHEK2, CTNNA1, DICER1, EPCAM (Deletion/duplication testing only), GREM1 (promoter region deletion/duplication testing only), KIT, MEN1, MLH1, MSH2, MSH3, MSH6, MUTYH, NBN, NF1, NTHL1, PALB2, PDGFRA, PMS2, POLD1, POLE, PTEN, RAD50, RAD51C, RAD51D, SDHB, SDHC, SDHD, SMAD4, SMARCA4. STK11, TP53, TSC1, TSC2, and VHL.  The following genes were evaluated for sequence changes only: SDHA and HOXB13 c.251G>A variant only. The Prostate Cancer HRR Panel offered by [facility] includes sequencing and/or deletion duplication analysis of the following 10 genes: ATM, BARD1, BRCA1, BRCA2, BRIP1, CHEK2, FANCL, PALB2, RAD51C, RAD51D. The test report will be scanned into EPIC and located under the Molecular Pathology section of the Results Review tab.  A portion of the result report is included below for reference.     We discussed with Peter Dunlap that because current genetic testing is not perfect, it is possible there may be a gene mutation in one of these genes that current testing cannot detect, but that chance is small.  We also discussed that there could be another gene that has not yet been discovered, or that we have not yet tested, that is responsible for the cancer diagnoses in the family. It is also possible there is a hereditary cause for the cancer in the family that Peter Dunlap did not inherit and therefore was not identified  in his testing.  Therefore, it is important to remain in touch with cancer genetics in the future so that we can continue to offer Peter Dunlap the most up to date genetic testing.   Genetic testing did identify a variant of uncertain significance (VUS) in the NF1 gene called c.1178A>G.  At this time, it is unknown if this variant is associated with increased cancer risk or if this is a normal finding, but most variants such as this get reclassified to being inconsequential. It should not be used to make medical management decisions. With time, we suspect the lab will determine the significance of this variant, if any. If we do learn more about it, we will try to contact Mr. Betancur to discuss it further. However, it is important to stay in touch with [place] periodically and keep the address and phone number up to date.  CANCER SCREENING RECOMMENDATIONS: Mr. Lichtenwalner test result is considered negative (normal).  This means that we have not identified a hereditary cause for his personal and family history of cancer at this time. Most cancers happen by chance and this negative test suggests that his personal and family history of cancer may fall into  this category.    While reassuring, this does not definitively rule out a hereditary predisposition to cancer. It is still possible that there could be genetic mutations that are undetectable by current technology. There could be genetic mutations in genes that have not been tested or identified to increase cancer risk.  Therefore, it is recommended he continue to follow the cancer management and screening guidelines provided by his oncology and primary healthcare provider.   An individual's cancer risk and medical management are not determined by genetic test results alone. Overall cancer risk assessment incorporates additional factors, including personal medical history, family history, and any available genetic information that may result in a personalized plan for  cancer prevention and surveillance.  RECOMMENDATIONS FOR FAMILY MEMBERS:  Individuals in this family might be at some increased risk of developing cancer, over the general population risk, simply due to the family history of cancer.  We recommended women in this family have a yearly mammogram beginning at age 97, or 25 years younger than the earliest onset of cancer, an annual clinical breast exam, and perform monthly breast self-exams. Women in this family should also have a gynecological exam as recommended by their primary provider. All family members should be referred for colonoscopy starting at age 12.  FOLLOW-UP: Lastly, we discussed with Mr. Montesinos that cancer genetics is a rapidly advancing field and it is possible that new genetic tests will be appropriate for him and/or his family members in the future. We encouraged him to remain in contact with cancer genetics on an annual basis so we can update his personal and family histories and let him know of advances in cancer genetics that may benefit this family.   Our contact number was provided. Mr. Gastelum questions were answered to his satisfaction, and he knows he is welcome to call us at anytime with additional questions or concerns.   Peter Guy, MS, [facility] Genetic Counselor [email or web address] Phone: [phone]

## 2020-06-01 NOTE — Telephone Encounter (Signed)
Revealed negative genetic testing. Discussed that we do not know why he has prostate cancer or why there is cancer in the family. It is possible that there could be a mutation in a different gene that we are not testing, or our current technology may not be able detect certain mutations. It will therefore be important for him to stay in contact with genetics to keep up with whether additional testing may be appropriate in the future.   A variant of uncertain significance was detected in the NF1 gene called c.1178A>G. His result is still considered normal at this time and should not impact his medical management.

## 2020-06-01 NOTE — Progress Notes (Signed)
Urological Symptom Review  Patient is experiencing the following symptoms: Get up at night to urinate Trouble starting stream   Review of Systems  Gastrointestinal (upper)  : Negative for upper GI symptoms  Gastrointestinal (lower) : Negative for lower GI symptoms  Constitutional : Negative for symptoms  Skin: Negative for skin symptoms  Eyes: Negative for eye symptoms  Ear/Nose/Throat : Negative for Ear/Nose/Throat symptoms  Hematologic/Lymphatic: Negative for Hematologic/Lymphatic symptoms  Cardiovascular : Negative for cardiovascular symptoms  Respiratory : Negative for respiratory symptoms  Endocrine: Negative for endocrine symptoms  Musculoskeletal: Back pain Joint pain  Neurological: Negative for neurological symptoms  Psychologic: Negative for psychiatric symptoms

## 2020-06-01 NOTE — Telephone Encounter (Signed)
Pt need a referral to see a ENT

## 2020-06-02 ENCOUNTER — Ambulatory Visit (INDEPENDENT_AMBULATORY_CARE_PROVIDER_SITE_OTHER): Payer: Medicare Other | Admitting: Nurse Practitioner

## 2020-06-02 ENCOUNTER — Encounter: Payer: Self-pay | Admitting: Nurse Practitioner

## 2020-06-02 VITALS — BP 144/72 | HR 67 | Temp 98.4°F | Ht 72.0 in | Wt 315.4 lb

## 2020-06-02 DIAGNOSIS — H938X1 Other specified disorders of right ear: Secondary | ICD-10-CM | POA: Diagnosis not present

## 2020-06-02 DIAGNOSIS — J0100 Acute maxillary sinusitis, unspecified: Secondary | ICD-10-CM | POA: Insufficient documentation

## 2020-06-02 LAB — TESTOSTERONE: Testosterone: 8 ng/dL — ABNORMAL LOW (ref 264–916)

## 2020-06-02 MED ORDER — FLUTICASONE PROPIONATE 50 MCG/ACT NA SUSP: 2.0000 | Freq: Every day | NASAL | 6 refills | Status: AC

## 2020-06-02 MED ORDER — AMOXICILLIN 875 MG PO TABS: 875.0000 mg | ORAL_TABLET | Freq: Two times a day (BID) | ORAL | 0 refills | Status: DC

## 2020-06-02 NOTE — Assessment & Plan Note (Signed)
Acute, ongoing.  Reports sinus symptoms/headache for about 10 days that are not improving with Claritin.  Will treat for bacterial sinusitis with amoxicillin x 7 days.  Return to clinic if symptoms persist despite antibiotics.

## 2020-06-02 NOTE — Progress Notes (Signed)
Subjective:    Patient ID: Peter Dunlap, male    DOB: June 26, 1955, 64 y.o.   MRN: [id number]  HPI: Peter Dunlap is a 64 y.o. male presenting for ear fullness.  Chief Complaint  Patient presents with  . Ear Fullness    Hearing crackling sounds in the right ear for the past 2 months, has been using at home methods to stop the noise. No pain, No wax build in either ear. Denies any other sx or trauma to the ear   EAR FULLNESS Duration: months Involved ear(s): right Severity:  moderate  Quality:  Dull, crackling Fever: no Otorrhea: no Upper respiratory infection symptoms: yes; sinus headache Pruritus: yes Hearing loss: yes Water immersion no Using Q-tips: yes Recurrent otitis media: no Status: better Treatments attempted: claritin, ear wax removal, oil   Allergies  Allergen Reactions  . Trazodone And Nefazodone     Nightmares  . Lactose Intolerance (Gi) Other (See Comments)    UPSET STOMACH     Outpatient Encounter Medications as of 06/02/2020  Medication Sig  . allopurinol (ZYLOPRIM) 100 MG tablet TAKE 1 TABLET(100 MG) BY MOUTH DAILY  . amoxicillin (AMOXIL) 875 MG tablet Take 1 tablet (875 mg total) by mouth 2 (two) times daily.  [name] aspirin EC 325 MG tablet Take 1 tablet (325 mg total) daily by mouth.  . Blood Glucose Monitoring Suppl (BLOOD GLUCOSE SYSTEM PAK) KIT Use as directed to monitor FSBS 1x daily. Dx: E11.9.  [name] colchicine 0.6 MG tablet   . Cyanocobalamin (VITAMIN B 12) 500 MCG TABS Take by mouth.  . DEXILANT 60 MG capsule TAKE 1 CAPSULE(60 MG) BY MOUTH DAILY  . ERLEADA 60 MG tablet TAKE 4 TABLETS (240 MG TOTAL) BY MOUTH DAILY. MAY BE TAKEN WITH OR WITHOUT FOOD. SWALLOW TABLETS WHOLE.  . fluticasone (FLONASE) 50 MCG/ACT nasal spray Place 2 sprays into both nostrils daily.  . furosemide (LASIX) 20 MG tablet TAKE 3 TABLETS BY MOUTH DAILY  . glipiZIDE (GLUCOTROL XL) 5 MG 24 hr tablet TAKE 1 TABLET(5 MG) BY MOUTH DAILY WITH BREAKFAST  . isosorbide  mononitrate (IMDUR) 60 MG 24 hr tablet TAKE 1 TABLET BY MOUTH DAILY  . levothyroxine (SYNTHROID) 200 MCG tablet TAKE 1 TABLET(200 MCG) BY MOUTH DAILY BEFORE BREAKFAST  . loratadine (CLARITIN) 10 MG tablet TAKE 1 TABLET(10 MG) BY MOUTH DAILY AS NEEDED FOR ALLERGIES  . Magnesium Oxide 400 (240 Mg) MG TABS TAKE 1 TABLET BY MOUTH TWICE DAILY  . metFORMIN (GLUCOPHAGE) 500 MG tablet TAKE 1 TABLET BY MOUTH TWICE DAILY  . nitroGLYCERIN (NITROSTAT) 0.4 MG SL tablet PLACE 1 TABLET UNDER THE TONGUE EVERY 5 MINUTES AS NEEDED FOR CHEST PAIN. CALL 911 AT THIRD DOSE IN 15 MINUTES  . ONETOUCH ULTRA test strip USE TO TEST ONCE DAILY  . oxyCODONE-acetaminophen (PERCOCET) 10-325 MG tablet Take 1 tablet by mouth every 8 (eight) hours as needed for pain.  . pantoprazole (PROTONIX) 40 MG tablet Take 1 tablet (40 mg total) by mouth daily.  . silodosin (RAPAFLO) 8 MG CAPS capsule Take 1 capsule (8 mg total) by mouth 2 (two) times daily.  [name] SIMVASTATIN PO   . sotalol (BETAPACE) 160 MG tablet TAKE 1 TABLET(160 MG) BY MOUTH TWICE DAILY  . SOTALOL HCL PO   . Vitamin D, Ergocalciferol, (DRISDOL) 1.25 MG (50000 UNIT) CAPS capsule TAKE 1 CAPSULE BY MOUTH EVERY 7 DAYS   No facility-administered encounter medications on file as of 06/02/2020.    Patient Active Problem List  Diagnosis Date Noted  . Acute non-recurrent maxillary sinusitis 06/02/2020  . Crackling sound in right ear 06/02/2020  . Genetic testing 05/23/2020  . Family history of CML (chronic monocytic leukemia)   . Family history of lung cancer   . Family history of breast cancer   . Family history of ovarian cancer   . Family history of stomach cancer   . Family history of cancer of mouth   . 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 Bilateral carotid artery stenosis 08/12/2019  . Carpal tunnel syndrome 07/03/2018  . Ulnar neuropathy at elbow 07/03/2018  . Essential hypertension 02/21/2018  . Vitamin D deficiency  02/21/2018  . Presence of Watchman left atrial appendage closure device 04/12/2017  . Chronic pain syndrome 10/17/2016  . Prostate cancer ([place]) 03/10/2016  . History of right-sided carotid endarterectomy 11/25/2015  . Encounter for postoperative carotid endarterectomy surveillance 11/25/2015  . Paroxysmal atrial fibrillation (H[facility])   . Pacemaker-St Jude 09/18/2014  . Tachy-brady syndrome ([place]) 08/19/2014  . Chronic insomnia 07/24/2014  . OA (osteoarthritis) of knee 05/26/2014  . S/P subdural hematoma evacuation-Nov 2015 04/10/2014  . 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[place]) 04/02/2014  . Chronic diastolic heart failure ([place]) 03/04/2014  . 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 Gout 02/17/2014  . OSA (obstructive sleep apnea) 02/17/2014  . Class 3 obesity 12/04/2013  . Type 2 diabetes mellitus with neurological complications ([place]) 69/62/9528  . Hypertrophic cardiomyopathy ([place]) 09/07/2011  . Hypertensive heart disease with CHF (congestive heart failure) ([place])   . CAD (coronary artery disease)   . Cerebrovascular disease   . Mixed hyperlipidemia   . ERECTILE DYSFUNCTION, ORGANIC 08/11/2010  . PSVT (paroxysmal supraventricular tachycardia) ([place]) 06/03/2008  . Hereditary and idiopathic peripheral neuropathy 01/28/2008  . BPH (benign prostatic hypertrophy) 05/11/2006  . GERD 04/23/2006    Past Medical History:  Diagnosis Date  . Arteriosclerotic cardiovascular disease (ASCVD) 2005   catheterization in 10/2010:50% mid LAD, diffuse distal disease, circumflex irregularities, large dominant RCA with a 50% ostial, 70% distal, 60% posterolateral and 70% PDA; normal EF  . Arthritis   . Benign prostatic hypertrophy   . Bilateral carpal tunnel syndrome 07/03/2018  . Cerebrovascular disease 2010   R. carotid endarterectomy; Duplex in 10/2010-widely patent ICAs, subtotal left vertebral-not thought to be contributing to symptoms  . Cervical spine disease    CT in  2012-advanced degeneration and spondylosis with moderate spinal stenosis at C3-C6  . CHF (congestive heart failure) ([place])   . Depression   . Diabetes mellitus without complication ([facility])   . Erectile dysfunction   . Family history of breast cancer   . Family history of cancer of mouth   . Family history of CML (chronic monocytic leukemia)   . Family history of lung cancer   . Family history of ovarian cancer   . Family history of stomach cancer   . Gastroesophageal reflux disease   . H/O hiatal hernia   . H/O: substance abuse ([place])    Cocaine, marijuana, alcohol.  Quit 2013.   [name] Hyperlipidemia   . Hypertension   . Non-ST elevation myocardial infarction (NSTEMI), initial episode of care [facility]) 12/02/2013   DES LAD  . Obesity   . Prostate cancer ([place])   . Sleep apnea    CPAP  . Tachy-brady syndrome ([facility])    a. s/p STJ dual chamber PPM   . Thyroid disease   . Tobacco abuse    Quit 2014  . Ulnar neuropathy at elbow  07/03/2018   Bilateral    Relevant past medical, surgical, family and social history reviewed and updated as indicated. Interim medical history since our last visit reviewed.  Review of Systems  Constitutional: Negative.  Negative for activity change, appetite change, fatigue and fever.  HENT: Positive for congestion, ear pain, hearing loss and sinus pressure. Negative for ear discharge, mouth sores, nosebleeds, postnasal drip, rhinorrhea, sinus pain, sneezing, sore throat and trouble swallowing.   Eyes: Negative.   Skin: Negative.   Neurological: Positive for headaches. Negative for dizziness and weakness.  Psychiatric/Behavioral: Negative.     Per HPI unless specifically indicated above     Objective:    BP (!) 144/72   Pulse 67   Temp 98.4 F (36.9 C)   Ht 6' (1.829 m)   Wt (!) 315 lb 6.4 oz (143.1 kg)   SpO2 96%   BMI 42.78 kg/m   Wt Readings from Last 3 Encounters:  06/02/20 (!) 315 lb 6.4 oz (143.1 kg)  06/01/20 (!) 310 lb (140.6 kg)  05/21/20 (!)  314 lb (142.4 kg)    Physical Exam Vitals and nursing note reviewed.  Constitutional:      General: He is not in acute distress.    Appearance: Normal appearance. He is obese. He is not toxic-appearing.  HENT:     Head: Normocephalic and atraumatic.     Right Ear: Tympanic membrane, ear canal and external ear normal. There is no impacted cerumen.     Left Ear: Tympanic membrane, ear canal and external ear normal. There is no impacted cerumen.     Nose: Nose normal. No congestion or rhinorrhea.     Mouth/Throat:     Mouth: Mucous membranes are moist.     Pharynx: No oropharyngeal exudate or posterior oropharyngeal erythema.  Eyes:     General: No scleral icterus.    Extraocular Movements: Extraocular movements intact.  Pulmonary:     Effort: Pulmonary effort is normal. No respiratory distress.  Skin:    General: Skin is warm.     Capillary Refill: Capillary refill takes less than 2 seconds.     Coloration: Skin is not jaundiced or pale.     Findings: No erythema.  Neurological:     Mental Status: He is alert and oriented to person, place, and time.     Motor: No weakness.     Gait: Gait normal.  Psychiatric:        Mood and Affect: Mood normal.        Behavior: Behavior normal.        Thought Content: Thought content normal.        Judgment: Judgment normal.        Assessment & Plan:   Problem List Items Addressed This Visit      Respiratory   Acute non-recurrent maxillary sinusitis - Primary    Acute, ongoing.  Reports sinus symptoms/headache for about 10 days that are not improving with Claritin.  Will treat for bacterial sinusitis with amoxicillin x 7 days.  Return to clinic if symptoms persist despite antibiotics.      Relevant Medications   fluticasone (FLONASE) 50 MCG/ACT nasal spray   amoxicillin (AMOXIL) 875 MG tablet     Nervous and Auditory   Crackling sound in right ear    Acute, ongoing x months.  Unclear etiology.  External canals clear of cerumen  today and TM normal.  Will start on daily flonase to help with ?rhinitis symptoms and see if  this helps with crackling.  Wondering if this could be side effect of recent treatment he started for recurrent prostate cancer.  Will also place referral to ENT per patient request.  If they do not find anything to explain sound, may consider referral to Audiologist.      Relevant Medications   fluticasone (FLONASE) 50 MCG/ACT nasal spray   Other Relevant Orders   Ambulatory referral to ENT       Follow up plan: Return if symptoms worsen or fail to improve.

## 2020-06-02 NOTE — Assessment & Plan Note (Addendum)
Acute, ongoing x months.  Unclear etiology.  External canals clear of cerumen today and TM normal.  Will start on daily flonase to help with ?rhinitis symptoms and see if this helps with crackling.  Wondering if this could be side effect of recent treatment he started for recurrent prostate cancer.  Will also place referral to ENT per patient request.  If they do not find anything to explain sound, may consider referral to Audiologist.

## 2020-06-02 NOTE — Telephone Encounter (Signed)
Call placed to patient to inquire.   Reports that he has had crackling sounds in R ear for approximately 2 months.   Advised that OV required to evaluate. Appointment scheduled.

## 2020-06-03 ENCOUNTER — Other Ambulatory Visit: Payer: Self-pay | Admitting: Family Medicine

## 2020-06-07 ENCOUNTER — Other Ambulatory Visit: Payer: Self-pay

## 2020-06-07 MED ORDER — MAGNESIUM OXIDE -MG SUPPLEMENT 400 (240 MG) MG PO TABS: 1.0000 | ORAL_TABLET | Freq: Two times a day (BID) | ORAL | 0 refills | Status: DC

## 2020-06-10 ENCOUNTER — Other Ambulatory Visit: Payer: Self-pay | Admitting: Nurse Practitioner

## 2020-06-10 ENCOUNTER — Other Ambulatory Visit: Payer: Self-pay | Admitting: Family Medicine

## 2020-06-10 DIAGNOSIS — E890 Postprocedural hypothyroidism: Secondary | ICD-10-CM

## 2020-06-11 ENCOUNTER — Other Ambulatory Visit: Payer: Self-pay | Admitting: Nurse Practitioner

## 2020-06-11 ENCOUNTER — Other Ambulatory Visit: Payer: Self-pay | Admitting: Internal Medicine

## 2020-06-11 DIAGNOSIS — I209 Angina pectoris, unspecified: Secondary | ICD-10-CM

## 2020-06-11 DIAGNOSIS — R0600 Dyspnea, unspecified: Secondary | ICD-10-CM

## 2020-06-11 DIAGNOSIS — R634 Abnormal weight loss: Secondary | ICD-10-CM

## 2020-06-11 DIAGNOSIS — M10071 Idiopathic gout, right ankle and foot: Secondary | ICD-10-CM

## 2020-06-11 DIAGNOSIS — E1169 Type 2 diabetes mellitus with other specified complication: Secondary | ICD-10-CM

## 2020-06-14 ENCOUNTER — Other Ambulatory Visit (HOSPITAL_COMMUNITY): Payer: Self-pay | Admitting: Hematology

## 2020-06-16 ENCOUNTER — Other Ambulatory Visit (HOSPITAL_COMMUNITY): Payer: Self-pay | Admitting: *Deleted

## 2020-06-16 DIAGNOSIS — Z191 Hormone sensitive malignancy status: Secondary | ICD-10-CM

## 2020-06-16 DIAGNOSIS — C61 Malignant neoplasm of prostate: Secondary | ICD-10-CM

## 2020-06-16 NOTE — Progress Notes (Signed)
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 A variant of uncertain significance (VUS) was detected in the NF1 gene called c.1178A>G. The report date is 05/23/2020.  The Common Hereditary Cancers Panel offered by [facility] includes sequencing and/or deletion duplication testing of the following 47 genes: APC, ATM, AXIN2, BARD1, BMPR1A, BRCA1, BRCA2, BRIP1, CDH1, CDK4, CDKN2A (p14ARF), CDKN2A (p16INK4a), CHEK2, CTNNA1, DICER1, EPCAM (Deletion/duplication testing only), GREM1 (promoter region deletion/duplication testing only), KIT, MEN1, MLH1, MSH2, MSH3, MSH6, MUTYH, NBN, NF1, NTHL1, PALB2, PDGFRA, PMS2, POLD1, POLE, PTEN, RAD50, RAD51C, RAD51D, SDHB, SDHC, SDHD, SMAD4, SMARCA4. STK11, TP53, TSC1, TSC2, and VHL.  The following genes were evaluated for sequence changes only: SDHA and HOXB13 c.251G>A variant only. The Prostate Cancer HRR Panel offered by [facility] includes sequencing and/or deletion duplication analysis of the following 10 genes: ATM, BARD1, BRCA1, BRCA2, BRIP1, CHEK2, FANCL, PALB2, RAD51C, RAD51D.     CANCER STAGING: Cancer Staging No matching staging information was found for the patient.  INTERVAL HISTORY:  [name], a 65 y.o. male, returns for routine follow-up of his metastatic castration sensitive prostate cancer. Peter Dunlap  was last seen by Dr. Nicholas Lose on 05/13/2020.   Today he reports that he is taking 4 tablets of her thalidomide.  His energy levels are low since the start of the pill.  Numbness in the feet has been stable.  Denies any fevers or night sweats.  Has some hot flashes.  REVIEW OF SYSTEMS:  Review of Systems  Constitutional: Positive for fatigue.  Neurological: Positive for numbness.  All other systems reviewed and are negative.   PAST MEDICAL/SURGICAL HISTORY:  Past Medical History:  Diagnosis Date  . Arteriosclerotic cardiovascular disease (ASCVD) 2005   catheterization in 10/2010:50% mid LAD, diffuse distal disease, circumflex irregularities, large dominant RCA with a 50% ostial, 70% distal, 60% posterolateral and 70% PDA; normal EF  . Arthritis   . Benign prostatic hypertrophy   . Bilateral carpal tunnel syndrome 07/03/2018  . Cerebrovascular disease 2010   R. carotid endarterectomy; Duplex in 10/2010-widely patent ICAs, subtotal left vertebral-not thought to be contributing to symptoms  . Cervical spine disease    CT in 2012-advanced degeneration and spondylosis with moderate spinal stenosis at C3-C6  . CHF (congestive heart failure) ([place])   . Depression   . Diabetes mellitus without complication ([place])   . Erectile dysfunction   . Family history of breast cancer   . Family history of cancer of mouth   . Family history of CML (chronic monocytic leukemia)   . Family history of lung cancer   . Family history of ovarian cancer   . Family history of stomach cancer   . Gastroesophageal reflux disease   . H/O hiatal hernia   . H/O: substance abuse ([place])    Cocaine, marijuana, alcohol.  Quit 2013.   [name] Hyperlipidemia   .  Hypertension   . Non-ST elevation myocardial infarction (NSTEMI), initial episode of care [facility]) 12/02/2013   DES LAD  . Obesity   . Prostate cancer ([place])   . Sleep apnea    CPAP  . Tachy-brady syndrome ([place])    a. s/p STJ dual chamber PPM   . Thyroid disease    . Tobacco abuse    Quit 2014  . 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 BRAIN SURGERY  2015   hematoma evacuation  . BURR HOLE Right 04/13/2014   Procedure: Haskell Flirt;  Surgeon: Charlie Pitter, MD;  Location: [facility]URO ORS;  Service: Neurosurgery;  Laterality: Right;  . CAROTID ENDARTERECTOMY Right Feb. 25, 2010    CEA  . CORONARY ANGIOPLASTY WITH STENT PLACEMENT  12/03/2013   LAD 90%-->0% W/ Promus Premier DES 3.0 mm x 16 mm, CFX OK, RCA 40%, EF 70-75%  . LEFT ATRIAL APPENDAGE OCCLUSION N/A 08/05/2015   Procedure: LEFT ATRIAL APPENDAGE OCCLUSION;  Surgeon: [name], MD;  Location: [facility] CV LAB;  Service: Cardiovascular;  Laterality: N/A;  . LEFT HEART CATHETERIZATION WITH CORONARY ANGIOGRAM Left 12/03/2013   Procedure: LEFT HEART CATHETERIZATION WITH CORONARY ANGIOGRAM;  Surgeon: Leonie Man, MD;  Location: [facility] CATH LAB;  Service: Cardiovascular;  Laterality: Left;  . LEFT HEART CATHETERIZATION WITH CORONARY ANGIOGRAM N/A 01/26/2014   Procedure: LEFT HEART CATHETERIZATION WITH CORONARY ANGIOGRAM;  Surgeon: Jettie Booze, MD;  Location: [facility] CATH LAB;  Service: Cardiovascular;  Laterality: N/A;  . LEFT HEART CATHETERIZATION WITH CORONARY ANGIOGRAM N/A 08/03/2014   Procedure: LEFT HEART CATHETERIZATION WITH CORONARY ANGIOGRAM;  Surgeon: Burnell Blanks, MD;  Location: [facility] CATH LAB;  Service: Cardiovascular;  Laterality: N/A;  . PERCUTANEOUS CORONARY STENT INTERVENTION (PCI-S)  12/03/2013   Procedure: PERCUTANEOUS CORONARY STENT INTERVENTION (PCI-S);  Surgeon: Leonie Man, MD;  Location: [facility] CATH LAB;  Service: Cardiovascular;;  . PERMANENT PACEMAKER INSERTION N/A 09/18/2014   Procedure: PERMANENT PACEMAKER INSERTION;  Surgeon: [name], MD;  Location: [facility] CATH LAB;  Service: Cardiovascular;  Laterality: N/A;  . RADIOFREQUENCY ABLATION  2005   for PSVT  . TEE WITHOUT CARDIOVERSION N/A 07/27/2015    Procedure: TRANSESOPHAGEAL ECHOCARDIOGRAM (TEE);  Surgeon: Lelon Perla, MD;  Location: [facility] ENDOSCOPY;  Service: Cardiovascular;  Laterality: N/A;  . TEE WITHOUT CARDIOVERSION N/A 09/15/2015   Procedure: TRANSESOPHAGEAL ECHOCARDIOGRAM (TEE);  Surgeon: Thayer Headings, MD;  Location: [facility] ENDOSCOPY;  Service: Cardiovascular;  Laterality: N/A;    SOCIAL HISTORY:  Social History   Socioeconomic History  . Marital status: Married    Spouse name: Not on file  . Number of children: 0  . Years of education: Not on file  . Highest education level: Not on file  Occupational History  . Occupation: Retired  Tobacco Use  . Smoking status: Former Smoker    Packs/day: 1.00    Years: 40.00    Pack years: 40.00    Types: Cigarettes    Start date: 10/20/1972    Quit date: 10/10/2012    Years since quitting: 7.6  . Smokeless tobacco: Never Used  . Tobacco comment: Quit in May.   Vaping Use  . Vaping Use: Never used  Substance and Sexual Activity  . Alcohol use: Yes    Alcohol/week: 0.0 standard drinks    Comment: former drinker-- sober since 2013.   . Drug use: No    Types: Cocaine  Comment: quit cocaine 10/2011  . Sexual activity: Yes    Partners: Female  Other Topics Concern  . Not on file  Social History Narrative   Lives in [place].   Social Determinants of Health   Financial Resource Strain: Low Risk   . Difficulty of Paying Living Expenses: Not hard at all  Food Insecurity: No Food Insecurity  . Worried About Charity fundraiser in the Last Year: Never true  . Ran Out of Food in the Last Year: Never true  Transportation Needs: No Transportation Needs  . Lack of Transportation (Medical): No  . Lack of Transportation (Non-Medical): No  Physical Activity: Inactive  . Days of Exercise per Week: 0 days  . Minutes of Exercise per Session: 0 min  Stress: No Stress Concern Present  . Feeling of Stress : Not at all  Social Connections: Moderately Isolated  . Frequency of  Communication with Friends and Family: More than three times a week  . Frequency of Social Gatherings with Friends and Family: Once a week  . Attends Religious Services: Never  . Active Member of Clubs or Organizations: No  . Attends Archivist Meetings: Never  . Marital Status: Married  Human resources officer Violence: Not At Risk  . Fear of Current or Ex-Partner: No  . Emotionally Abused: No  . Physically Abused: No  . Sexually Abused: No    FAMILY HISTORY:  Family History  Problem Relation Age of Onset  . Hypertension Mother        Cerebrovascular disease  . Diabetes Mother   . Coronary artery disease Father 28  . Diabetes type II Father   . Hypertension Father   . Heart attack Father   . Diabetes Brother   . Hypertension Brother   . Diabetes Sister   . Hypertension Sister   . Heart attack Sister 61  . Leukemia Sister 24       CML  . Breast cancer Maternal Grandmother        dx 68s  . Lung cancer Maternal Uncle        dx >50, smoker  . Cancer Cousin        mouth cancer, dx 58s, no smoking/chew tobacco hx (maternal 1st cousin)  . Ovarian cancer Cousin        dx <50 (maternal 1st cousin)  . Stomach cancer Cousin        dx 69s (maternal 1st cousin)  . Cancer Cousin        type unknown to pt, dx >50 (paternal 1st cousin)    CURRENT MEDICATIONS:  Current Outpatient Medications  Medication Sig Dispense Refill  . allopurinol (ZYLOPRIM) 100 MG tablet TAKE 1 TABLET(100 MG) BY MOUTH DAILY 90 tablet 0  . amoxicillin (AMOXIL) 875 MG tablet Take 1 tablet (875 mg total) by mouth 2 (two) times daily. 14 tablet 0  . aspirin EC 325 MG tablet Take 1 tablet (325 mg total) daily by mouth. 30 tablet 0  . Blood Glucose Monitoring Suppl (BLOOD GLUCOSE SYSTEM PAK) KIT Use as directed to monitor FSBS 1x daily. Dx: E11.9. 1 kit 1  . colchicine 0.6 MG tablet     . Cyanocobalamin (VITAMIN B 12) 500 MCG TABS Take by mouth.    . DEXILANT 60 MG capsule TAKE 1 CAPSULE(60 MG) BY MOUTH  DAILY 30 capsule 5  . ERLEADA 60 MG tablet TAKE 4 TABLETS (240 MG TOTAL) BY MOUTH DAILY. MAY BE TAKEN WITH OR WITHOUT FOOD. SWALLOW TABLETS  WHOLE. 120 tablet 0  . fluticasone (FLONASE) 50 MCG/ACT nasal spray Place 2 sprays into both nostrils daily. 16 g 6  . furosemide (LASIX) 20 MG tablet TAKE 3 TABLETS BY MOUTH DAILY 270 tablet 3  . glipiZIDE (GLUCOTROL XL) 5 MG 24 hr tablet TAKE 1 TABLET(5 MG) BY MOUTH DAILY WITH BREAKFAST 90 tablet 0  . isosorbide mononitrate (IMDUR) 60 MG 24 hr tablet TAKE 1 TABLET BY MOUTH DAILY 90 tablet 2  . levothyroxine (SYNTHROID) 200 MCG tablet TAKE 1 TABLET(200 MCG) BY MOUTH DAILY BEFORE BREAKFAST 90 tablet 0  . loratadine (CLARITIN) 10 MG tablet TAKE 1 TABLET(10 MG) BY MOUTH DAILY AS NEEDED FOR ALLERGIES 30 tablet 2  . Magnesium Oxide 400 (240 Mg) MG TABS Take 1 tablet (400 mg total) by mouth 2 (two) times daily. 180 tablet 0  . metFORMIN (GLUCOPHAGE) 500 MG tablet TAKE 1 TABLET BY MOUTH TWICE DAILY 180 tablet 0  . nitroGLYCERIN (NITROSTAT) 0.4 MG SL tablet PLACE 1 TABLET UNDER THE TONGUE EVERY 5 MINUTES AS NEEDED FOR CHEST PAIN. CALL 911 AT THIRD DOSE IN 15 MINUTES 25 tablet 1  . ONETOUCH ULTRA test strip USE TO TEST ONCE DAILY 100 strip 1  . oxyCODONE-acetaminophen (PERCOCET) 10-325 MG tablet Take 1 tablet by mouth every 8 (eight) hours as needed for pain. 90 tablet 0  . pantoprazole (PROTONIX) 40 MG tablet Take 1 tablet (40 mg total) by mouth daily. 30 tablet 3  . silodosin (RAPAFLO) 8 MG CAPS capsule Take 1 capsule (8 mg total) by mouth 2 (two) times daily. 60 capsule 11  . SIMVASTATIN PO     . sotalol (BETAPACE) 160 MG tablet TAKE 1 TABLET(160 MG) BY MOUTH TWICE DAILY 180 tablet 2  . Vitamin D, Ergocalciferol, (DRISDOL) 1.25 MG (50000 UNIT) CAPS capsule TAKE 1 CAPSULE BY MOUTH EVERY 7 DAYS 12 capsule 0   No current facility-administered medications for this visit.    ALLERGIES:  Allergies  Allergen Reactions  . Trazodone And Nefazodone     Nightmares  .  Lactose Intolerance (Gi) Other (See Comments)    UPSET STOMACH     PHYSICAL EXAM:  Performance status (ECOG): 1 - Symptomatic but completely ambulatory  There were no vitals filed for this visit. Wt Readings from Last 3 Encounters:  06/02/20 (!) 315 lb 6.4 oz (143.1 kg)  06/01/20 (!) 310 lb (140.6 kg)  05/21/20 (!) 314 lb (142.4 kg)   Physical Exam Vitals reviewed.  Constitutional:      Appearance: Normal appearance.  Cardiovascular:     Rate and Rhythm: Normal rate and regular rhythm.     Pulses: Normal pulses.     Heart sounds: Normal heart sounds.  Pulmonary:     Breath sounds: Normal breath sounds.  Skin:    General: Skin is warm.  Neurological:     Mental Status: He is alert and oriented to person, place, and time.  Psychiatric:        Mood and Affect: Mood normal.        Behavior: Behavior normal.      LABORATORY DATA:  I have reviewed the labs as listed.  CBC Latest Ref Rng & Units 05/20/2020 05/13/2020 03/15/2020  WBC 4.0 - 10.5 K/uL 4.7 5.2 6.6  Hemoglobin 13.0 - 17.0 g/dL 14.5 14.8 15.5  Hematocrit 39.0 - 52.0 % 42.4 43.7 44.5  Platelets 150 - 400 K/uL 101(L) 116(L) 117(L)   CMP Latest Ref Rng & Units 05/20/2020 05/13/2020 03/30/2020  Glucose 70 -  99 mg/dL 215(H) 111(H) 136(H)  BUN 8 - 23 mg/dL _0 Creatinine 0.61 - 1.24 mg/dL 1.20 1.08 1.04  Sodium 135 - 145 mmol/L 133(L) 136 139  Potassium 3.5 - 5.1 mmol/L 3.9 4.1 4.0  Chloride 98 - 111 mmol/L 103 104 104  CO2 22 - 32 mmol/L 20(L) 23 24  Calcium 8.9 - 10.3 mg/dL 8.9 9.0 9.3  Total Protein 6.5 - 8.1 g/dL 7.8 7.8 7.3  Total Bilirubin 0.3 - 1.2 mg/dL 1.0 1.1 1.2  Alkaline Phos 38 - 126 U/L 78 66 -  AST 15 - 41 U/L 34 32 27  ALT 0 - 44 U/L _1 DIAGNOSTIC IMAGING:  I have independently reviewed the scans and discussed with the patient. No results found.   ASSESSMENT:  1.  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 -Status post IMRT, 40 sessions completed on 06/14/2016 by Dr. Tammi Klippel. -PSA 0.7 (10/15/2018), 0.8 (02/18/2019), 1.4 (December 2020), 1.4 (June 2021) -Prostate biopsy on 02/10/2020-1 out of 12 cores from left mid lateral region positive for adenocarcinoma. -CTAP on 03/09/2020 with newly enlarged left iliac lymph nodes measuring 1.1 x 0.9 cm.  Hepatic steatosis with early signs of cirrhosis. -F-18 PSMA PET scan on 04/12/2020 showed radiotracer activity associated with enlarged left external iliac lymph node 9 mm with SUV 7.6.  Small left common iliac lymph node 6 mm, SUV 8.6.  Left periaortic lymph node 5 mm, SUV 6.3.  Lymph node between IVC and aorta at the level of right renal hilum, SUV 8.6.  Lymph node deep to the IVC measuring 6 mm, SUV 5.4.  No skeletal metastasis.  2.  Social/family history: -He is a retired Nature conservation officer.  He lives at home with his wife.  He plays golf.  He quit smoking in 2015, smoked 1 pack/day for more than 20 years. -Sister has CML.  Maternal grandmother had breast cancer.  Maternal uncle had lung cancer.  2 maternal cousins had tongue cancer and uterine cancer.   PLAN:  1.  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 - We have reviewed his labs.  PSA continued to improve to 0.06. - LFTs show elevated AST.  We will closely monitor. - He reports some fatigue with apalutamide.  I have encouraged increased activity.  I would not decrease the dose at this time. - Continue monthly degarelix.  We have chosen paralytics due to decreased cardiovascular effects. - RTC 4 weeks for follow-up.  2.  Poorly controlled diabetes: -Continue metformin and glipizide.  Blood glucose today is 162 and better. - Continue follow-up with Dr. Dorris Fetch.   Orders placed this encounter:  No  orders of the defined types were placed in this encounter.    Derek Jack, MD [place] [phone]   I, Milinda Antis, am acting as a scribe for Dr. Sanda Linger.  I, Derek Jack MD, have reviewed the above documentation for accuracy and completeness, and I agree with the above.

## 2020-06-17 ENCOUNTER — Inpatient Hospital Stay (HOSPITAL_COMMUNITY): Payer: Medicare Other

## 2020-06-17 ENCOUNTER — Inpatient Hospital Stay (HOSPITAL_COMMUNITY): Payer: Medicare Other | Attending: Hematology

## 2020-06-17 ENCOUNTER — Inpatient Hospital Stay (HOSPITAL_BASED_OUTPATIENT_CLINIC_OR_DEPARTMENT_OTHER): Payer: Medicare Other | Admitting: Hematology

## 2020-06-17 ENCOUNTER — Other Ambulatory Visit: Payer: Self-pay

## 2020-06-17 VITALS — BP 114/71 | HR 60 | Temp 98.1°F | Resp 17 | Wt 313.5 lb

## 2020-06-17 DIAGNOSIS — E119 Type 2 diabetes mellitus without complications: Secondary | ICD-10-CM | POA: Insufficient documentation

## 2020-06-17 DIAGNOSIS — I5032 Chronic diastolic (congestive) heart failure: Secondary | ICD-10-CM | POA: Insufficient documentation

## 2020-06-17 DIAGNOSIS — C61 Malignant neoplasm of prostate: Secondary | ICD-10-CM | POA: Diagnosis present

## 2020-06-17 DIAGNOSIS — Z806 Family history of leukemia: Secondary | ICD-10-CM | POA: Insufficient documentation

## 2020-06-17 DIAGNOSIS — R200 Anesthesia of skin: Secondary | ICD-10-CM | POA: Insufficient documentation

## 2020-06-17 DIAGNOSIS — Z801 Family history of malignant neoplasm of trachea, bronchus and lung: Secondary | ICD-10-CM | POA: Diagnosis not present

## 2020-06-17 DIAGNOSIS — R5383 Other fatigue: Secondary | ICD-10-CM | POA: Diagnosis not present

## 2020-06-17 DIAGNOSIS — Z8249 Family history of ischemic heart disease and other diseases of the circulatory system: Secondary | ICD-10-CM | POA: Insufficient documentation

## 2020-06-17 DIAGNOSIS — Z79899 Other long term (current) drug therapy: Secondary | ICD-10-CM | POA: Diagnosis not present

## 2020-06-17 DIAGNOSIS — Z803 Family history of malignant neoplasm of breast: Secondary | ICD-10-CM | POA: Diagnosis not present

## 2020-06-17 DIAGNOSIS — Z191 Hormone sensitive malignancy status: Secondary | ICD-10-CM | POA: Diagnosis not present

## 2020-06-17 DIAGNOSIS — Z8041 Family history of malignant neoplasm of ovary: Secondary | ICD-10-CM | POA: Insufficient documentation

## 2020-06-17 DIAGNOSIS — Z87891 Personal history of nicotine dependence: Secondary | ICD-10-CM | POA: Diagnosis not present

## 2020-06-17 DIAGNOSIS — I10 Essential (primary) hypertension: Secondary | ICD-10-CM | POA: Insufficient documentation

## 2020-06-17 DIAGNOSIS — Z800 Family history of malignant neoplasm of digestive organs: Secondary | ICD-10-CM | POA: Diagnosis not present

## 2020-06-17 DIAGNOSIS — E785 Hyperlipidemia, unspecified: Secondary | ICD-10-CM | POA: Insufficient documentation

## 2020-06-17 DIAGNOSIS — Z808 Family history of malignant neoplasm of other organs or systems: Secondary | ICD-10-CM | POA: Insufficient documentation

## 2020-06-17 DIAGNOSIS — Z833 Family history of diabetes mellitus: Secondary | ICD-10-CM | POA: Diagnosis not present

## 2020-06-17 DIAGNOSIS — Z5111 Encounter for antineoplastic chemotherapy: Secondary | ICD-10-CM | POA: Insufficient documentation

## 2020-06-17 LAB — COMPREHENSIVE METABOLIC PANEL
ALT: 24 U/L (ref 0–44)
AST: 46 U/L — ABNORMAL HIGH (ref 15–41)
Albumin: 3.5 g/dL (ref 3.5–5.0)
Alkaline Phosphatase: 126 U/L (ref 38–126)
Anion gap: 9 (ref 5–15)
BUN: 9 mg/dL (ref 8–23)
CO2: 21 mmol/L — ABNORMAL LOW (ref 22–32)
Calcium: 9.1 mg/dL (ref 8.9–10.3)
Chloride: 106 mmol/L (ref 98–111)
Creatinine, Ser: 1.12 mg/dL (ref 0.61–1.24)
GFR, Estimated: >60 mL/min (ref >60)
Glucose, Bld: 162 mg/dL — ABNORMAL HIGH (ref 70–99)
Potassium: 4.2 mmol/L (ref 3.5–5.1)
Sodium: 136 mmol/L (ref 135–145)
Total Bilirubin: 1.9 mg/dL — ABNORMAL HIGH (ref 0.3–1.2)
Total Protein: 8 g/dL (ref 6.5–8.1)

## 2020-06-17 LAB — CBC WITH DIFFERENTIAL/PLATELET
Abs Immature Granulocytes: 0 10*3/uL (ref 0.00–0.07)
Basophils Absolute: 0.1 10*3/uL (ref 0.0–0.1)
Basophils Relative: 1 %
Eosinophils Absolute: 0.1 10*3/uL (ref 0.0–0.5)
Eosinophils Relative: 2 %
HCT: 41.8 % (ref 39.0–52.0)
Hemoglobin: 14.2 g/dL (ref 13.0–17.0)
Immature Granulocytes: 0 %
Lymphocytes Relative: 34 %
Lymphs Abs: 1.9 10*3/uL (ref 0.7–4.0)
MCH: 34.4 pg — ABNORMAL HIGH (ref 26.0–34.0)
MCHC: 34 g/dL (ref 30.0–36.0)
MCV: 101.2 fL — ABNORMAL HIGH (ref 80.0–100.0)
Monocytes Absolute: 0.6 10*3/uL (ref 0.1–1.0)
Monocytes Relative: 10 %
Neutro Abs: 2.9 10*3/uL (ref 1.7–7.7)
Neutrophils Relative %: 53 %
Platelets: 119 10*3/uL — ABNORMAL LOW (ref 150–400)
RBC: 4.13 MIL/uL — ABNORMAL LOW (ref 4.22–5.81)
RDW: 14.6 % (ref 11.5–15.5)
WBC: 5.6 10*3/uL (ref 4.0–10.5)
nRBC: 0 % (ref 0.0–0.2)

## 2020-06-17 LAB — PSA: Prostatic Specific Antigen: 0.06 ng/mL (ref 0.00–4.00)

## 2020-06-17 MED ORDER — DEGARELIX ACETATE 80 MG ~~LOC~~ SOLR
80.0000 mg | Freq: Once | SUBCUTANEOUS | Status: AC
  Administered 2020-06-17: 80 mg via SUBCUTANEOUS
  Filled 2020-06-17: qty 4

## 2020-06-17 MED FILL — ERLEADA 60 MG TABS: 60 | 30 days supply | Qty: 120 | Fill #0

## 2020-06-17 NOTE — Patient Instructions (Signed)
[facility] at [facility] Discharge Instructions  You were seen today by Dr. Katragadda. Follow up as scheduled.   Thank you for choosing [facility] at [facility] to provide your oncology and hematology care.  To afford each patient quality time with our provider, please arrive at least 15 minutes before your scheduled appointment time.   If you have a lab appointment with the Cancer Center please come in thru the Main Entrance and check in at the main information desk.  You need to re-schedule your appointment should you arrive 10 or more minutes late.  We strive to give you quality time with our providers, and arriving late affects you and other patients whose appointments are after yours.  Also, if you no show three or more times for appointments you may be dismissed from the clinic at the providers discretion.     Again, thank you for choosing [facility].  Our hope is that these requests will decrease the amount of time that you wait before being seen by our physicians.       _____________________________________________________________  Should you have questions after your visit to [facility], please contact our office at [phone] and follow the prompts.  Our office hours are 8:00 a.m. and 4:30 p.m. Monday - Friday.  Please note that voicemails left after 4:00 p.m. may not be returned until the following business day.  We are closed weekends and major holidays.  You do have access to a nurse 24-7, just call the main number to the clinic [phone] and do not press any options, hold on the line and a nurse will answer the phone.    For prescription refill requests, have your pharmacy contact our office and allow 72 hours.    Due to Covid, you will need to wear a mask upon entering the hospital. If you do not have a mask, a mask will be given to you at the Main Entrance upon arrival. For doctor visits, patients  may have 1 support person age 18 or older with them. For treatment visits, patients can not have anyone with them due to social distancing guidelines and our immunocompromised population.      

## 2020-06-17 NOTE — Patient Instructions (Signed)
[facility] at [facility] Discharge Instructions  Received Firmagon injection today. Follow-up as scheduled   Thank you for choosing [facility] at [facility] to provide your oncology and hematology care.  To afford each patient quality time with our provider, please arrive at least 15 minutes before your scheduled appointment time.   If you have a lab appointment with the Cancer Center please come in thru the Main Entrance and check in at the main information desk.  You need to re-schedule your appointment should you arrive 10 or more minutes late.  We strive to give you quality time with our providers, and arriving late affects you and other patients whose appointments are after yours.  Also, if you no show three or more times for appointments you may be dismissed from the clinic at the providers discretion.     Again, thank you for choosing [facility].  Our hope is that these requests will decrease the amount of time that you wait before being seen by our physicians.       _____________________________________________________________  Should you have questions after your visit to  Cancer Center, please contact our office at [phone] and follow the prompts.  Our office hours are 8:00 a.m. and 4:30 p.m. Monday - Friday.  Please note that voicemails left after 4:00 p.m. may not be returned until the following business day.  We are closed weekends and major holidays.  You do have access to a nurse 24-7, just call the main number to the clinic [phone] and do not press any options, hold on the line and a nurse will answer the phone.    For prescription refill requests, have your pharmacy contact our office and allow 72 hours.    Due to Covid, you will need to wear a mask upon entering the hospital. If you do not have a mask, a mask will be given to you at the Main Entrance upon arrival. For doctor visits, patients may  have 1 support person age 18 or older with them. For treatment visits, patients can not have anyone with them due to social distancing guidelines and our immunocompromised population.     

## 2020-06-17 NOTE — Progress Notes (Signed)
1202 Labs reviewed with and pt seen by Dr. Delton Coombes and pt approved for Firmagon injection today per MD                               Peter Dunlap tolerated Firmagon injection well without complaints or incident. Pt discharged self ambulatory in satisfactory condition

## 2020-06-17 NOTE — Progress Notes (Signed)
Patient assessed and labs reviewed by Dr. Delton Coombes. Okay to proceed with injection. Primary RN and pharmacy aware.

## 2020-06-18 DIAGNOSIS — G4733 Obstructive sleep apnea (adult) (pediatric): Secondary | ICD-10-CM | POA: Diagnosis not present

## 2020-06-25 ENCOUNTER — Other Ambulatory Visit: Payer: Self-pay | Admitting: Family Medicine

## 2020-06-29 ENCOUNTER — Other Ambulatory Visit: Payer: Self-pay | Admitting: *Deleted

## 2020-06-29 MED ORDER — OXYCODONE-ACETAMINOPHEN 10-325 MG PO TABS: 1.0000 | ORAL_TABLET | Freq: Three times a day (TID) | ORAL | 0 refills | Status: DC | PRN

## 2020-06-29 NOTE — Telephone Encounter (Signed)
Received call from patient.   Requested refill on Oxycodone/APAP.   Ok to refill??  Last office visit 06/02/2020.  Last refill 05/25/2020.

## 2020-07-11 ENCOUNTER — Other Ambulatory Visit: Payer: Self-pay | Admitting: Family Medicine

## 2020-07-12 ENCOUNTER — Other Ambulatory Visit: Payer: Self-pay | Admitting: Family Medicine

## 2020-07-13 ENCOUNTER — Telehealth: Payer: Self-pay | Admitting: Pharmacist

## 2020-07-13 NOTE — Progress Notes (Addendum)
Chronic Care Management Pharmacy Assistant   Name: Peter Dunlap  MRN: [id number] DOB: 1956-01-30  Reason for Encounter: Disease State for DM.  Patient Questions:  1.  Have you seen any other providers since your last visit? Yes.    2.  Any changes in your medicines or health? Yes.  PCP : [name], MD   Their chronic conditions include: CAD, CHF, Afib, HTN, GERD, Type II DM, Hypothyroidism, HLD, Gout, Chronic Pain.  Office Visits: 06/02/20 Eulogio Bear, NP. Ear concerns. STARTED Amoxicillin 875 mg 2 times daily and Fluticasone Propionate 50 MCG/ACT 2 sprays each nostril daily.   Consults: 06/17/20 Oncology Derek Jack, MD. 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 No medication changes. Infusion. 06/01/20 Urology [place] Annie Main, MD. No medication changes. 05/21/20 Endo Brita Romp, NP. Advised to exercise more often and to change diet. No medication changes.   Allergies:   Allergies  Allergen Reactions   Trazodone And Nefazodone     Nightmares   Lactose Intolerance (Gi) Other (See Comments)    UPSET STOMACH     Medications: Outpatient Encounter Medications as of 07/13/2020  Medication Sig   allopurinol (ZYLOPRIM) 100 MG tablet TAKE 1 TABLET(100 MG) BY MOUTH DAILY   amoxicillin (AMOXIL) 875 MG tablet Take 1 tablet (875 mg total) by mouth 2 (two) times daily.   aspirin EC 325 MG tablet Take 1 tablet (325 mg total) daily by mouth.   Blood Glucose Monitoring Suppl (BLOOD GLUCOSE SYSTEM PAK) KIT Use as directed to monitor FSBS 1x daily. Dx: E11.9.   colchicine 0.6 MG tablet    Cyanocobalamin (VITAMIN B 12) 500 MCG TABS Take by mouth.   DEXILANT 60 MG capsule TAKE 1 CAPSULE(60 MG) BY MOUTH DAILY   ERLEADA 60 MG tablet TAKE 4 TABLETS (240 MG TOTAL) BY MOUTH DAILY. MAY BE TAKEN WITH OR WITHOUT FOOD. SWALLOW TABLETS WHOLE.   fluticasone (FLONASE) 50 MCG/ACT nasal spray Place 2 sprays into both nostrils daily.    furosemide (LASIX) 20 MG tablet TAKE 3 TABLETS BY MOUTH DAILY   glipiZIDE (GLUCOTROL XL) 5 MG 24 hr tablet TAKE 1 TABLET(5 MG) BY MOUTH DAILY WITH BREAKFAST   isosorbide mononitrate (IMDUR) 60 MG 24 hr tablet TAKE 1 TABLET BY MOUTH DAILY   levothyroxine (SYNTHROID) 200 MCG tablet TAKE 1 TABLET(200 MCG) BY MOUTH DAILY BEFORE BREAKFAST   loratadine (CLARITIN) 10 MG tablet TAKE 1 TABLET(10 MG) BY MOUTH DAILY AS NEEDED FOR ALLERGIES   Magnesium Oxide 400 (240 Mg) MG TABS Take 1 tablet (400 mg total) by mouth 2 (two) times daily.   metFORMIN (GLUCOPHAGE) 500 MG tablet TAKE 1 TABLET BY MOUTH TWICE DAILY   nitroGLYCERIN (NITROSTAT) 0.4 MG SL tablet PLACE 1 TABLET UNDER THE TONGUE EVERY 5 MINUTES AS NEEDED FOR CHEST PAIN. CALL 911 AT THIRD DOSE IN 15 MINUTES   ONETOUCH ULTRA test strip USE TO TEST ONCE DAILY   oxyCODONE-acetaminophen (PERCOCET) 10-325 MG tablet Take 1 tablet by mouth every 8 (eight) hours as needed for pain.   pantoprazole (PROTONIX) 40 MG tablet Take 1 tablet (40 mg total) by mouth daily.   silodosin (RAPAFLO) 8 MG CAPS capsule Take 1 capsule (8 mg total) by mouth 2 (two) times daily.   SIMVASTATIN PO    sotalol (BETAPACE) 160 MG tablet TAKE 1 TABLET(160 MG) BY MOUTH TWICE DAILY   Vitamin D, Ergocalciferol, (DRISDOL) 1.25 MG (50000 UNIT) CAPS capsule TAKE 1 CAPSULE BY MOUTH EVERY 7 DAYS   No  facility-administered encounter medications on file as of 07/13/2020.    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   Bilateral carotid  artery stenosis 08/12/2019   Carpal tunnel syndrome 07/03/2018   Ulnar neuropathy at elbow 07/03/2018   Essential hypertension 02/21/2018   Vitamin D deficiency 02/21/2018   Presence of Watchman left atrial appendage closure device 04/12/2017   Chronic pain syndrome 10/17/2016   Prostate cancer ([place]) 03/10/2016   History of right-sided carotid endarterectomy 11/25/2015   Encounter for postoperative carotid endarterectomy surveillance 11/25/2015   Paroxysmal atrial fibrillation ([place])    Pacemaker-St Jude 09/18/2014   Tachy-brady syndrome ([place]) 08/19/2014   Chronic insomnia 07/24/2014   OA (osteoarthritis) of knee 05/26/2014   S/P subdural hematoma evacuation-Nov 2015 04/10/2014   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[place]) 04/02/2014   Chronic diastolic heart failure ([place]) 03/04/2014   Hypothyroidism following radioiodine therapy 02/17/2014   Gout 02/17/2014   OSA (obstructive sleep apnea) 02/17/2014   Class 3 obesity 12/04/2013   Type 2 diabetes mellitus with neurological complications ([place]) 62/37/6283   Hypertrophic cardiomyopathy ([place]) 09/07/2011   Hypertensive heart disease with CHF (congestive heart failure) ([place])    CAD (coronary artery disease)    Cerebrovascular disease    Mixed hyperlipidemia    ERECTILE DYSFUNCTION, ORGANIC 08/11/2010   PSVT (paroxysmal supraventricular tachycardia) ([place]) 06/03/2008   Hereditary and idiopathic peripheral neuropathy 01/28/2008   BPH (benign prostatic hypertrophy) 05/11/2006   GERD 04/23/2006    Goals Addressed   None    Recent Relevant Labs: Lab Results  Component Value Date/Time   HGBA1C 7.0 (H) 04/19/2020 03:18 PM   HGBA1C 7.4 (H) 03/15/2020 11:42 AM   HGBA1C 6.1 06/26/2015 12:00 AM   HGBA1C 4.8 04/21/2014 09:47 AM   MICROALBUR 1.3 03/15/2020 11:42 AM   MICROALBUR 4.1 11/05/2015 09:06 AM    Kidney Function Lab Results  Component Value Date/Time   CREATININE 1.12 06/17/2020 09:51 AM   CREATININE 1.20 05/20/2020  09:27 AM   CREATININE 1.04 03/30/2020 07:35 AM   CREATININE 1.17 03/15/2020 11:42 AM   CREATININE 1.2 04/17/2016 11:53 AM   GFR 69.60 08/27/2014 11:48 AM   GFRNONAA >60 06/17/2020 09:51 AM   GFRNONAA 76 03/30/2020 07:35 AM   GFRAA 88 03/30/2020 07:35 AM    Current antihyperglycemic regimen:  Metformin 500 mg twice daily Glipizide XL 5 mg daily  What recent interventions/DTPs have been made to improve glycemic control:  None.  Have there been any recent hospitalizations or ED visits since last visit with CPP? No.  Patient reports hypoglycemic symptoms, including Nervous/irritable   Patient reports hyperglycemic symptoms, including fatigue and dizziness.   How often are you checking your blood sugar?  Patient stated he checks his blood sugar about 2-3 times week.  What are your blood sugars ranging? Patient doesn't write he doesn't write his readings down but he does remember this afternoon his blood sugar reading was 137. Fasting: N/A Before meals: N/A After meals: N/A Bedtime: N/A  During the week, how often does  your blood glucose drop below 70? Patient stated Never   Are you checking your feet daily/regularly?  Patient stated he checks his feet daily. Patient stated his stay sore  Adherence Review: Is the patient currently on a STATIN medication? Yes, Simvastatin 40 mg at bedtime   Is the patient currently on ACE/ARB medication? No.  Does the patient have >5 day gap between last estimated fill dates? No  Patient stated he doesn't have any concerns about his medication at this time.    Follow-Up:  Pharmacist Review   Charlann Lange, RMA Clinical Pharmacist Assistant [phone]  5 minutes spent in review, coordination, and documentation.  Reviewed by: Beverly Milch, PharmD Clinical Pharmacist [facility] [phone]

## 2020-07-14 ENCOUNTER — Other Ambulatory Visit (HOSPITAL_COMMUNITY): Payer: Self-pay | Admitting: Hematology

## 2020-07-15 ENCOUNTER — Other Ambulatory Visit: Payer: Self-pay

## 2020-07-15 ENCOUNTER — Inpatient Hospital Stay (HOSPITAL_COMMUNITY): Payer: Medicare Other | Attending: Hematology

## 2020-07-15 ENCOUNTER — Inpatient Hospital Stay (HOSPITAL_COMMUNITY): Payer: Medicare Other

## 2020-07-15 ENCOUNTER — Inpatient Hospital Stay (HOSPITAL_BASED_OUTPATIENT_CLINIC_OR_DEPARTMENT_OTHER): Payer: Medicare Other | Admitting: Hematology

## 2020-07-15 VITALS — BP 126/75 | HR 60 | Temp 98.4°F | Resp 17 | Wt 320.4 lb

## 2020-07-15 DIAGNOSIS — Z801 Family history of malignant neoplasm of trachea, bronchus and lung: Secondary | ICD-10-CM | POA: Insufficient documentation

## 2020-07-15 DIAGNOSIS — I10 Essential (primary) hypertension: Secondary | ICD-10-CM | POA: Diagnosis not present

## 2020-07-15 DIAGNOSIS — I495 Sick sinus syndrome: Secondary | ICD-10-CM | POA: Diagnosis not present

## 2020-07-15 DIAGNOSIS — Z833 Family history of diabetes mellitus: Secondary | ICD-10-CM | POA: Diagnosis not present

## 2020-07-15 DIAGNOSIS — E739 Lactose intolerance, unspecified: Secondary | ICD-10-CM | POA: Diagnosis not present

## 2020-07-15 DIAGNOSIS — Z191 Hormone sensitive malignancy status: Secondary | ICD-10-CM

## 2020-07-15 DIAGNOSIS — E119 Type 2 diabetes mellitus without complications: Secondary | ICD-10-CM | POA: Insufficient documentation

## 2020-07-15 DIAGNOSIS — Z8249 Family history of ischemic heart disease and other diseases of the circulatory system: Secondary | ICD-10-CM | POA: Diagnosis not present

## 2020-07-15 DIAGNOSIS — Z808 Family history of malignant neoplasm of other organs or systems: Secondary | ICD-10-CM | POA: Diagnosis not present

## 2020-07-15 DIAGNOSIS — C61 Malignant neoplasm of prostate: Secondary | ICD-10-CM | POA: Insufficient documentation

## 2020-07-15 DIAGNOSIS — C778 Secondary and unspecified malignant neoplasm of lymph nodes of multiple regions: Secondary | ICD-10-CM | POA: Insufficient documentation

## 2020-07-15 DIAGNOSIS — Z803 Family history of malignant neoplasm of breast: Secondary | ICD-10-CM | POA: Diagnosis not present

## 2020-07-15 DIAGNOSIS — I509 Heart failure, unspecified: Secondary | ICD-10-CM | POA: Diagnosis not present

## 2020-07-15 DIAGNOSIS — Z800 Family history of malignant neoplasm of digestive organs: Secondary | ICD-10-CM | POA: Diagnosis not present

## 2020-07-15 DIAGNOSIS — E669 Obesity, unspecified: Secondary | ICD-10-CM | POA: Diagnosis not present

## 2020-07-15 DIAGNOSIS — Z809 Family history of malignant neoplasm, unspecified: Secondary | ICD-10-CM | POA: Diagnosis not present

## 2020-07-15 DIAGNOSIS — R10819 Abdominal tenderness, unspecified site: Secondary | ICD-10-CM | POA: Diagnosis not present

## 2020-07-15 DIAGNOSIS — R140 Abdominal distension (gaseous): Secondary | ICD-10-CM | POA: Diagnosis not present

## 2020-07-15 DIAGNOSIS — Z87891 Personal history of nicotine dependence: Secondary | ICD-10-CM | POA: Diagnosis not present

## 2020-07-15 DIAGNOSIS — E785 Hyperlipidemia, unspecified: Secondary | ICD-10-CM | POA: Insufficient documentation

## 2020-07-15 DIAGNOSIS — Z806 Family history of leukemia: Secondary | ICD-10-CM | POA: Diagnosis not present

## 2020-07-15 DIAGNOSIS — Z8041 Family history of malignant neoplasm of ovary: Secondary | ICD-10-CM | POA: Diagnosis not present

## 2020-07-15 DIAGNOSIS — Z79899 Other long term (current) drug therapy: Secondary | ICD-10-CM | POA: Insufficient documentation

## 2020-07-15 DIAGNOSIS — R42 Dizziness and giddiness: Secondary | ICD-10-CM | POA: Insufficient documentation

## 2020-07-15 DIAGNOSIS — Z5111 Encounter for antineoplastic chemotherapy: Secondary | ICD-10-CM | POA: Insufficient documentation

## 2020-07-15 DIAGNOSIS — Z1159 Encounter for screening for other viral diseases: Secondary | ICD-10-CM | POA: Diagnosis not present

## 2020-07-15 LAB — CBC WITH DIFFERENTIAL/PLATELET
Abs Immature Granulocytes: 0.01 10*3/uL (ref 0.00–0.07)
Basophils Absolute: 0.1 10*3/uL (ref 0.0–0.1)
Basophils Relative: 1 %
Eosinophils Absolute: 0.1 10*3/uL (ref 0.0–0.5)
Eosinophils Relative: 3 %
HCT: 41.1 % (ref 39.0–52.0)
Hemoglobin: 13.6 g/dL (ref 13.0–17.0)
Immature Granulocytes: 0 %
Lymphocytes Relative: 36 %
Lymphs Abs: 1.9 10*3/uL (ref 0.7–4.0)
MCH: 34.1 pg — ABNORMAL HIGH (ref 26.0–34.0)
MCHC: 33.1 g/dL (ref 30.0–36.0)
MCV: 103 fL — ABNORMAL HIGH (ref 80.0–100.0)
Monocytes Absolute: 0.6 10*3/uL (ref 0.1–1.0)
Monocytes Relative: 11 %
Neutro Abs: 2.6 10*3/uL (ref 1.7–7.7)
Neutrophils Relative %: 49 %
Platelets: 104 10*3/uL — ABNORMAL LOW (ref 150–400)
RBC: 3.99 MIL/uL — ABNORMAL LOW (ref 4.22–5.81)
RDW: 14 % (ref 11.5–15.5)
WBC: 5.3 10*3/uL (ref 4.0–10.5)
nRBC: 0 % (ref 0.0–0.2)

## 2020-07-15 LAB — COMPREHENSIVE METABOLIC PANEL
ALT: 14 U/L (ref 0–44)
AST: 34 U/L (ref 15–41)
Albumin: 3.6 g/dL (ref 3.5–5.0)
Alkaline Phosphatase: 92 U/L (ref 38–126)
Anion gap: 9 (ref 5–15)
BUN: 9 mg/dL (ref 8–23)
CO2: 22 mmol/L (ref 22–32)
Calcium: 9 mg/dL (ref 8.9–10.3)
Chloride: 104 mmol/L (ref 98–111)
Creatinine, Ser: 0.98 mg/dL (ref 0.61–1.24)
GFR, Estimated: >60 mL/min (ref >60)
Glucose, Bld: 173 mg/dL — ABNORMAL HIGH (ref 70–99)
Potassium: 4.1 mmol/L (ref 3.5–5.1)
Sodium: 135 mmol/L (ref 135–145)
Total Bilirubin: 1.3 mg/dL — ABNORMAL HIGH (ref 0.3–1.2)
Total Protein: 7.9 g/dL (ref 6.5–8.1)

## 2020-07-15 LAB — PSA: Prostatic Specific Antigen: 0.05 ng/mL (ref 0.00–4.00)

## 2020-07-15 MED ORDER — DEGARELIX ACETATE 80 MG ~~LOC~~ SOLR
80.0000 mg | Freq: Once | SUBCUTANEOUS | Status: AC
  Administered 2020-07-15: 80 mg via SUBCUTANEOUS
  Filled 2020-07-15: qty 4

## 2020-07-15 NOTE — Progress Notes (Signed)
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 A variant of uncertain significance (VUS) was detected in the NF1 gene called c.1178A>G. The report date is 05/23/2020.  The Common Hereditary Cancers Panel offered by [facility] includes sequencing and/or deletion duplication testing of the following 47 genes: APC, ATM, AXIN2, BARD1, BMPR1A, BRCA1, BRCA2, BRIP1, CDH1, CDK4, CDKN2A (p14ARF), CDKN2A (p16INK4a), CHEK2, CTNNA1, DICER1, EPCAM (Deletion/duplication testing only), GREM1 (promoter region deletion/duplication testing only), KIT, MEN1, MLH1, MSH2, MSH3, MSH6, MUTYH, NBN, NF1, NTHL1, PALB2, PDGFRA, PMS2, POLD1, POLE, PTEN, RAD50, RAD51C, RAD51D, SDHB, SDHC, SDHD, SMAD4, SMARCA4. STK11, TP53, TSC1, TSC2, and VHL.  The following genes were evaluated for sequence changes only: SDHA and HOXB13 c.251G>A variant only. The Prostate Cancer HRR Panel offered by [facility] includes sequencing and/or deletion duplication analysis of the following 10 genes: ATM, BARD1, BRCA1, BRCA2, BRIP1, CHEK2, FANCL, PALB2, RAD51C, RAD51D.     CANCER STAGING: Cancer Staging No matching staging information was found for the patient.  INTERVAL HISTORY:  [name], a 65 y.o. male, returns for routine follow-up of his metastatic castration sensitive  prostate cancer. Peter Dunlap was last seen on 06/17/2020.   Today he reports feeling okay. He complains of having abdominal distention and a gassy sensation, but denies having abdominal pain. He notes that he has nausea, but denies vomiting or CP. He is currently taking Erleada 240 mg and Dexilant daily. He is lactose intolerant. He has a history of 3 MI's.   REVIEW OF SYSTEMS:  Review of Systems  Constitutional: Positive for fatigue (25%). Negative for appetite change.  Cardiovascular: Negative for chest pain.  Gastrointestinal: Positive for abdominal distention (gassy sensation) and nausea. Negative for abdominal pain and vomiting.  Neurological: Positive for dizziness.  All other systems reviewed and are negative.   PAST MEDICAL/SURGICAL HISTORY:  Past Medical History:  Diagnosis Date  . Arteriosclerotic cardiovascular disease (ASCVD) 2005   catheterization in 10/2010:50% mid LAD, diffuse distal disease, circumflex irregularities, large dominant RCA with a 50% ostial, 70% distal, 60% posterolateral and 70% PDA; normal EF  . Arthritis   . Benign prostatic hypertrophy   . Bilateral carpal tunnel syndrome 07/03/2018  . Cerebrovascular disease 2010   R. carotid endarterectomy; Duplex in 10/2010-widely patent ICAs, subtotal left vertebral-not thought to be contributing to symptoms  . Cervical spine disease    CT in 2012-advanced degeneration and spondylosis with moderate spinal stenosis at C3-C6  . CHF (congestive heart failure) ([place])   . Depression   . Diabetes mellitus without complication ([place])   . Erectile dysfunction   . Family history of breast cancer   . Family history of cancer of mouth   . Family history of CML (chronic monocytic leukemia)   . Family history of lung cancer   . Family history of ovarian cancer   . Family history of stomach  cancer   . Gastroesophageal reflux disease   . H/O hiatal hernia   . H/O: substance abuse ([place])    Cocaine, marijuana, alcohol.  Quit 2013.    [name] Hyperlipidemia   . Hypertension   . Non-ST elevation myocardial infarction (NSTEMI), initial episode of care [facility]) 12/02/2013   DES LAD  . Obesity   . Prostate cancer ([place])   . Sleep apnea    CPAP  . Tachy-brady syndrome ([place])    a. s/p STJ dual chamber PPM   . Thyroid disease   . Tobacco abuse    Quit 2014  . 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 BRAIN SURGERY  2015   hematoma evacuation  . BURR HOLE Right 04/13/2014   Procedure: Haskell Flirt;  Surgeon: Charlie Pitter, MD;  Location: [facility] NEURO ORS;  Service: Neurosurgery;  Laterality: Right;  . CAROTID ENDARTERECTOMY Right Feb. 25, 2010    CEA  . CORONARY ANGIOPLASTY WITH STENT PLACEMENT  12/03/2013   LAD 90%-->0% W/ Promus Premier DES 3.0 mm x 16 mm, CFX OK, RCA 40%, EF 70-75%  . LEFT ATRIAL APPENDAGE OCCLUSION N/A 08/05/2015   Procedure: LEFT ATRIAL APPENDAGE OCCLUSION;  Surgeon: [name], MD;  Location: [facility] CV LAB;  Service: Cardiovascular;  Laterality: N/A;  . LEFT HEART CATHETERIZATION WITH CORONARY ANGIOGRAM Left 12/03/2013   Procedure: LEFT HEART CATHETERIZATION WITH CORONARY ANGIOGRAM;  Surgeon: Leonie Man, MD;  Location[facility] CATH LAB;  Service: Cardiovascular;  Laterality: Left;  . LEFT HEART CATHETERIZATION WITH CORONARY ANGIOGRAM N/A 01/26/2014   Procedure: LEFT HEART CATHETERIZATION WITH CORONARY ANGIOGRAM;  Surgeon: Jettie Booze, MD;  Location: [facility] CATH LAB;  Service: Cardiovascular;  Laterality: N/A;  . LEFT HEART CATHETERIZATION WITH CORONARY ANGIOGRAM N/A 08/03/2014   Procedure: LEFT HEART CATHETERIZATION WITH CORONARY ANGIOGRAM;  Surgeon: Burnell Blanks, MD;  Location: [facility] CATH LAB;  Service: Cardiovascular;  Laterality: N/A;  . PERCUTANEOUS CORONARY STENT INTERVENTION (PCI-S)  12/03/2013   Procedure: PERCUTANEOUS CORONARY STENT INTERVENTION (PCI-S);  Surgeon: Leonie Man, MD;  Location: [facility] CATH LAB;  Service: Cardiovascular;;   . PERMANENT PACEMAKER INSERTION N/A 09/18/2014   Procedure: PERMANENT PACEMAKER INSERTION;  Surgeon: [name], MD;  Location: [facility] CATH LAB;  Service: Cardiovascular;  Laterality: N/A;  . RADIOFREQUENCY ABLATION  2005   for PSVT  . TEE WITHOUT CARDIOVERSION N/A 07/27/2015   Procedure: TRANSESOPHAGEAL ECHOCARDIOGRAM (TEE);  Surgeon: Lelon Perla, MD;  Location: [facility] ENDOSCOPY;  Service: Cardiovascular;  Laterality: N/A;  . TEE WITHOUT CARDIOVERSION N/A 09/15/2015   Procedure: TRANSESOPHAGEAL ECHOCARDIOGRAM (TEE);  Surgeon: Thayer Headings, MD;  Location: [facility] ENDOSCOPY;  Service: Cardiovascular;  Laterality: N/A;    SOCIAL HISTORY:  Social History   Socioeconomic History  . Marital status: Married    Spouse name: Not on file  . Number of children: 0  . Years of education: Not on file  . Highest education level: Not on file  Occupational History  . Occupation: Retired  Tobacco Use  . Smoking status: Former Smoker    Packs/day: 1.00    Years: 40.00    Pack years: 40.00    Types: Cigarettes    Start date: 10/20/1972    Quit date: 10/10/2012    Years since quitting: 7.7  . Smokeless tobacco: Never Used  . Tobacco comment: Quit in May.   Vaping Use  . Vaping Use: Never used  Substance and Sexual  Activity  . Alcohol use: Yes    Alcohol/week: 0.0 standard drinks    Comment: former drinker-- sober since 2013.   . Drug use: No    Types: Cocaine    Comment: quit cocaine 10/2011  . Sexual activity: Yes    Partners: Female  Other Topics Concern  . Not on file  Social History Narrative   Lives in [place].   Social Determinants of Health   Financial Resource Strain: Low Risk   . Difficulty of Paying Living Expenses: Not hard at all  Food Insecurity: No Food Insecurity  . Worried About Charity fundraiser in the Last Year: Never true  . Ran Out of Food in the Last Year: Never true  Transportation Needs: No Transportation Needs  . Lack of Transportation (Medical): No  .  Lack of Transportation (Non-Medical): No  Physical Activity: Inactive  . Days of Exercise per Week: 0 days  . Minutes of Exercise per Session: 0 min  Stress: No Stress Concern Present  . Feeling of Stress : Not at all  Social Connections: Moderately Isolated  . Frequency of Communication with Friends and Family: More than three times a week  . Frequency of Social Gatherings with Friends and Family: Once a week  . Attends Religious Services: Never  . Active Member of Clubs or Organizations: No  . Attends Archivist Meetings: Never  . Marital Status: Married  Human resources officer Violence: Not At Risk  . Fear of Current or Ex-Partner: No  . Emotionally Abused: No  . Physically Abused: No  . Sexually Abused: No    FAMILY HISTORY:  Family History  Problem Relation Age of Onset  . Hypertension Mother        Cerebrovascular disease  . Diabetes Mother   . Coronary artery disease Father 70  . Diabetes type II Father   . Hypertension Father   . Heart attack Father   . Diabetes Brother   . Hypertension Brother   . Diabetes Sister   . Hypertension Sister   . Heart attack Sister 61  . Leukemia Sister 46       CML  . Breast cancer Maternal Grandmother        dx 31s  . Lung cancer Maternal Uncle        dx >50, smoker  . Cancer Cousin        mouth cancer, dx 8s, no smoking/chew tobacco hx (maternal 1st cousin)  . Ovarian cancer Cousin        dx <50 (maternal 1st cousin)  . Stomach cancer Cousin        dx 22s (maternal 1st cousin)  . Cancer Cousin        type unknown to pt, dx >50 (paternal 1st cousin)    CURRENT MEDICATIONS:  Current Outpatient Medications  Medication Sig Dispense Refill  . allopurinol (ZYLOPRIM) 100 MG tablet TAKE 1 TABLET(100 MG) BY MOUTH DAILY 90 tablet 0  . aspirin EC 325 MG tablet Take 1 tablet (325 mg total) daily by mouth. 30 tablet 0  . Blood Glucose Monitoring Suppl (BLOOD GLUCOSE SYSTEM PAK) KIT Use as directed to monitor FSBS 1x daily. Dx:  E11.9. 1 kit 1  . colchicine 0.6 MG tablet     . Cyanocobalamin (VITAMIN B 12) 500 MCG TABS Take by mouth.    . DEXILANT 60 MG capsule TAKE 1 CAPSULE(60 MG) BY MOUTH DAILY 30 capsule 5  . ERLEADA 60 MG tablet TAKE 4 TABLETS (240  MG TOTAL) BY MOUTH DAILY. MAY BE TAKEN WITH OR WITHOUT FOOD. SWALLOW TABLETS WHOLE. 120 tablet 3  . fluticasone (FLONASE) 50 MCG/ACT nasal spray Place 2 sprays into both nostrils daily. 16 g 6  . furosemide (LASIX) 20 MG tablet TAKE 3 TABLETS BY MOUTH DAILY 270 tablet 3  . glipiZIDE (GLUCOTROL XL) 5 MG 24 hr tablet TAKE 1 TABLET(5 MG) BY MOUTH DAILY WITH BREAKFAST 90 tablet 0  . isosorbide mononitrate (IMDUR) 60 MG 24 hr tablet TAKE 1 TABLET BY MOUTH DAILY 90 tablet 2  . levothyroxine (SYNTHROID) 200 MCG tablet TAKE 1 TABLET(200 MCG) BY MOUTH DAILY BEFORE BREAKFAST 90 tablet 0  . loratadine (CLARITIN) 10 MG tablet TAKE 1 TABLET(10 MG) BY MOUTH DAILY AS NEEDED FOR ALLERGIES 30 tablet 2  . Magnesium Oxide 400 (240 Mg) MG TABS Take 1 tablet (400 mg total) by mouth 2 (two) times daily. 180 tablet 0  . metFORMIN (GLUCOPHAGE) 500 MG tablet TAKE 1 TABLET BY MOUTH TWICE DAILY 180 tablet 0  . nitroGLYCERIN (NITROSTAT) 0.4 MG SL tablet PLACE 1 TABLET UNDER THE TONGUE EVERY 5 MINUTES AS NEEDED FOR CHEST PAIN. CALL 911 AT THIRD DOSE IN 15 MINUTES 25 tablet 1  . ONETOUCH ULTRA test strip USE TO TEST ONCE DAILY 100 strip 1  . oxyCODONE-acetaminophen (PERCOCET) 10-325 MG tablet Take 1 tablet by mouth every 8 (eight) hours as needed for pain. 90 tablet 0  . pantoprazole (PROTONIX) 40 MG tablet Take 1 tablet (40 mg total) by mouth daily. 30 tablet 3  . silodosin (RAPAFLO) 8 MG CAPS capsule Take 1 capsule (8 mg total) by mouth 2 (two) times daily. 60 capsule 11  . SIMVASTATIN PO     . sotalol (BETAPACE) 160 MG tablet TAKE 1 TABLET(160 MG) BY MOUTH TWICE DAILY 180 tablet 2  . Vitamin D, Ergocalciferol, (DRISDOL) 1.25 MG (50000 UNIT) CAPS capsule TAKE 1 CAPSULE BY MOUTH EVERY 7 DAYS 12  capsule 0   No current facility-administered medications for this visit.    ALLERGIES:  Allergies  Allergen Reactions  . Trazodone And Nefazodone     Nightmares  . Lactose Intolerance (Gi) Other (See Comments)    UPSET STOMACH     PHYSICAL EXAM:  Performance status (ECOG): 1 - Symptomatic but completely ambulatory  Vitals:   07/15/20 1243  BP: 126/75  Pulse: 60  Resp: 17  Temp: 98.4 F (36.9 C)  SpO2: 97%   Wt Readings from Last 3 Encounters:  07/15/20 (!) 320 lb 6 oz (145.3 kg)  06/17/20 (!) 313 lb 8 oz (142.2 kg)  06/02/20 (!) 315 lb 6.4 oz (143.1 kg)   Physical Exam Vitals reviewed.  Constitutional:      Appearance: Normal appearance. He is obese.  Cardiovascular:     Rate and Rhythm: Normal rate and regular rhythm.     Pulses: Normal pulses.     Heart sounds: Normal heart sounds.  Pulmonary:     Effort: Pulmonary effort is normal.     Breath sounds: Normal breath sounds.  Abdominal:     General: There is distension.     Palpations: Abdomen is soft. There is no hepatomegaly, splenomegaly or mass.     Tenderness: There is abdominal tenderness in the right upper quadrant.  Musculoskeletal:     Right lower leg: No edema.     Left lower leg: No edema.  Neurological:     General: No focal deficit present.     Mental Status: He is alert and   oriented to person, place, and time.  Psychiatric:        Mood and Affect: Mood normal.        Behavior: Behavior normal.      LABORATORY DATA:  I have reviewed the labs as listed.  CBC Latest Ref Rng & Units 07/15/2020 06/17/2020 05/20/2020  WBC 4.0 - 10.5 K/uL 5.3 5.6 4.7  Hemoglobin 13.0 - 17.0 g/dL 13.6 14.2 14.5  Hematocrit 39.0 - 52.0 % 41.1 41.8 42.4  Platelets 150 - 400 K/uL 104(L) 119(L) 101(L)   CMP Latest Ref Rng & Units 07/15/2020 06/17/2020 05/20/2020  Glucose 70 - 99 mg/dL 173(H) 162(H) 215(H)  BUN 8 - 23 mg/dL 9 9 14  Creatinine 0.61 - 1.24 mg/dL 0.98 1.12 1.20  Sodium 135 - 145 mmol/L 135 136 133(L)   Potassium 3.5 - 5.1 mmol/L 4.1 4.2 3.9  Chloride 98 - 111 mmol/L 104 106 103  CO2 22 - 32 mmol/L 22 21(L) 20(L)  Calcium 8.9 - 10.3 mg/dL 9.0 9.1 8.9  Total Protein 6.5 - 8.1 g/dL 7.9 8.0 7.8  Total Bilirubin 0.3 - 1.2 mg/dL 1.3(H) 1.9(H) 1.0  Alkaline Phos 38 - 126 U/L 92 126 78  AST 15 - 41 U/L 34 46(H) 34  ALT 0 - 44 U/L 14 24 16    DIAGNOSTIC IMAGING:  I have independently reviewed the scans and discussed with the patient. No results found.   ASSESSMENT:  1. Metastatic castration sensitive prostate cancer topelvic and retroperitoneal lymph nodes: -Prostate cancer diagnosed on 01/18/2016 TRUS biopsy-10/12 cores positive for adenocarcinoma, Gleason 4+3=7, PSA 9.09. -Status post IMRT, 40 sessions completed on 06/14/2016 by Dr. Manning. -PSA 0.7 (10/15/2018), 0.8 (02/18/2019), 1.4 (December 2020), 1.4 (June 2021) -Prostate biopsy on 02/10/2020-1 out of 12 cores from left mid lateral region positive for adenocarcinoma. -CTAP on 03/09/2020 with newly enlarged left iliac lymph nodes measuring 1.1 x 0.9 cm. Hepatic steatosis with early signs of cirrhosis. -F-18 PSMAPET scan on 04/12/2020 showed radiotracer activity associated with enlarged left external iliac lymph node 9 mm with SUV 7.6. Small left common iliac lymph node 6 mm, SUV 8.6. Left periaortic lymph node 5 mm, SUV 6.3. Lymph node between IVC and aorta at the level of right renal hilum, SUV 8.6. Lymph node deep to the IVC measuring 6 mm, SUV 5.4. No skeletal metastasis.  2. Social/family history: -He is a retired construction worker. He lives at home with his wife. He plays golf. He quit smoking in 2015, smoked 1 pack/day for more than 20 years. -Sister has CML. Maternal grandmother had breast cancer. Maternal uncle had lung cancer. 2 maternal cousins had tongue cancer and uterine cancer.   PLAN:  1. Metastatic castration sensitive prostate cancer topelvic and retroperitoneal lymph nodes: -He is tolerating  apalutamide to 40 mg daily very well. -We will continue Firmagon monthly to minimize cardiovascular side effects. -Reported some abdominal bloating for 4 weeks.  Rare nausea no vomiting and no diarrhea reported.  Physical examination did not reveal any ascites.  Mild soreness in the right upper quadrant.  He complained of feeling gassy.  Recommend taking Gas-X.  Continue Dexilant. -Prior imaging showed that he has cirrhotic liver with normal spleen. -Review of labs showed mild thrombocytopenia which is stable.  Total bilirubin is elevated at 1.3 but rest of the LFTs are normal. -He will use Compazine as needed for nausea. -RTC 4 weeks with labs and injection.  2. Poorly controlled diabetes: -Blood sugar today is 172. -Continue Metformin and glipizide.    Continue follow-up with Dr. Nida.   Orders placed this encounter:  No orders of the defined types were placed in this encounter.    Sreedhar Katragadda, MD [facility] [phone]   I, Daniel Khashchuk, am acting as a scribe for Dr. Sreedhar Katagadda.  I, Sreedhar Katragadda MD, have reviewed the above documentation for accuracy and completeness, and I agree with the above.     

## 2020-07-15 NOTE — Progress Notes (Signed)
1319 Labs reviewed with and pt seen by Dr. Delton Coombes and pt approved for Firmagon injection today per MD                               Peter Dunlap tolerated Firmagon injection well without complaints or incident. Pt discharged self ambulatory in satisfactory condition

## 2020-07-15 NOTE — Progress Notes (Signed)
Patient was assessed by Dr. Delton Coombes and labs have been reviewed.  Patient is okay to proceed with firmagon injection today. Primary RN and pharmacy aware.

## 2020-07-15 NOTE — Patient Instructions (Signed)
[place] at [facility] Discharge Instructions  Received Mills Koller injection today. Follow-up as scheduled   Thank you for choosing [place] at [facility] to provide your oncology and hematology care.  To afford each patient quality time with our provider, please arrive at least 15 minutes before your scheduled appointment time.   If you have a lab appointment with the [facility] please come in thru the Main Entrance and check in at the main information desk.  You need to re-schedule your appointment should you arrive 10 or more minutes late.  We strive to give you quality time with our providers, and arriving late affects you and other patients whose appointments are after yours.  Also, if you no show three or more times for appointments you may be dismissed from the clinic at the providers discretion.     Again, thank you for choosing [facility].  Our hope is that these requests will decrease the amount of time that you wait before being seen by our physicians.       _____________________________________________________________  Should you have questions after your visit to [facility], please contact our office at [phone] and follow the prompts.  Our office hours are 8:00 a.m. and 4:30 p.m. Monday - Friday.  Please note that voicemails left after 4:00 p.m. may not be returned until the following business day.  We are closed weekends and major holidays.  You do have access to a nurse 24-7, just call the main number to the clinic [phone] and do not press any options, hold on the line and a nurse will answer the phone.    For prescription refill requests, have your pharmacy contact our office and allow 72 hours.    Due to Covid, you will need to wear a mask upon entering the hospital. If you do not have a mask, a mask will be given to you at the Main Entrance upon arrival. For doctor visits, patients may  have 1 support person age 59 or older with them. For treatment visits, patients can not have anyone with them due to social distancing guidelines and our immunocompromised population.

## 2020-07-17 ENCOUNTER — Other Ambulatory Visit: Payer: Self-pay | Admitting: Family Medicine

## 2020-07-17 DIAGNOSIS — E1169 Type 2 diabetes mellitus with other specified complication: Secondary | ICD-10-CM

## 2020-07-21 ENCOUNTER — Telehealth: Payer: Self-pay | Admitting: *Deleted

## 2020-07-21 DIAGNOSIS — H938X1 Other specified disorders of right ear: Secondary | ICD-10-CM

## 2020-07-21 DIAGNOSIS — G4733 Obstructive sleep apnea (adult) (pediatric): Secondary | ICD-10-CM | POA: Diagnosis not present

## 2020-07-21 NOTE — Telephone Encounter (Signed)
Received call from patient wife Raquel Sarna [phone]~ telephone.   Inquired as to status of of referral for ENT due to ear. Advised that referral from 05/2020 was cancelled instead of routed back.   Please try to expedite.

## 2020-07-22 DIAGNOSIS — E1136 Type 2 diabetes mellitus with diabetic cataract: Secondary | ICD-10-CM | POA: Diagnosis not present

## 2020-07-22 DIAGNOSIS — H25013 Cortical age-related cataract, bilateral: Secondary | ICD-10-CM | POA: Diagnosis not present

## 2020-07-22 DIAGNOSIS — H2513 Age-related nuclear cataract, bilateral: Secondary | ICD-10-CM | POA: Diagnosis not present

## 2020-07-26 ENCOUNTER — Other Ambulatory Visit (HOSPITAL_COMMUNITY): Payer: Self-pay

## 2020-07-27 ENCOUNTER — Ambulatory Visit (INDEPENDENT_AMBULATORY_CARE_PROVIDER_SITE_OTHER): Payer: Medicare Other | Admitting: Internal Medicine

## 2020-07-27 ENCOUNTER — Other Ambulatory Visit: Payer: Self-pay

## 2020-07-27 ENCOUNTER — Encounter: Payer: Self-pay | Admitting: Internal Medicine

## 2020-07-27 VITALS — BP 100/62 | HR 70 | Ht 72.0 in | Wt 313.2 lb

## 2020-07-27 DIAGNOSIS — I2510 Atherosclerotic heart disease of native coronary artery without angina pectoris: Secondary | ICD-10-CM | POA: Diagnosis not present

## 2020-07-27 DIAGNOSIS — I495 Sick sinus syndrome: Secondary | ICD-10-CM | POA: Diagnosis not present

## 2020-07-27 DIAGNOSIS — Z950 Presence of cardiac pacemaker: Secondary | ICD-10-CM

## 2020-07-27 DIAGNOSIS — I10 Essential (primary) hypertension: Secondary | ICD-10-CM

## 2020-07-27 MED ORDER — SOTALOL HCL 80 MG PO TABS: ORAL_TABLET | ORAL | 3 refills | Status: DC

## 2020-07-27 MED FILL — ERLEADA 60 MG TABS: 60 | 30 days supply | Qty: 120 | Fill #0

## 2020-07-27 NOTE — Progress Notes (Signed)
HPI Mr. Leyh returns today for followup. He has sinus node dysfunction, s/p PPM insertion, PAF, atrial tachycardia, obesity, CNS bleed, s/p Watchman. He has developed prostate CA and undergoing chemotherapy. He denies chest pain or sob. No edema. No syncope. Rare palpitations. He has lost 3 lbs. Allergies  Allergen Reactions  . Trazodone And Nefazodone     Nightmares  . Lactose Intolerance (Gi) Other (See Comments)    UPSET STOMACH      Current Outpatient Medications  Medication Sig Dispense Refill  . allopurinol (ZYLOPRIM) 100 MG tablet TAKE 1 TABLET(100 MG) BY MOUTH DAILY 90 tablet 0  . aspirin EC 325 MG tablet Take 1 tablet (325 mg total) daily by mouth. 30 tablet 0  . Blood Glucose Monitoring Suppl (BLOOD GLUCOSE SYSTEM PAK) KIT Use as directed to monitor FSBS 1x daily. Dx: E11.9. 1 kit 1  . colchicine 0.6 MG tablet     . Cyanocobalamin (VITAMIN B 12) 500 MCG TABS Take by mouth.    . DEXILANT 60 MG capsule TAKE 1 CAPSULE(60 MG) BY MOUTH DAILY 30 capsule 5  . ERLEADA 60 MG tablet TAKE 4 TABLETS (240 MG TOTAL) BY MOUTH DAILY. MAY BE TAKEN WITH OR WITHOUT FOOD. SWALLOW TABLETS WHOLE. 120 tablet 3  . fluticasone (FLONASE) 50 MCG/ACT nasal spray Place 2 sprays into both nostrils daily. 16 g 6  . furosemide (LASIX) 20 MG tablet TAKE 3 TABLETS BY MOUTH DAILY 270 tablet 3  . glipiZIDE (GLUCOTROL XL) 5 MG 24 hr tablet TAKE 1 TABLET(5 MG) BY MOUTH DAILY WITH BREAKFAST 90 tablet 0  . isosorbide mononitrate (IMDUR) 60 MG 24 hr tablet TAKE 1 TABLET BY MOUTH DAILY 90 tablet 2  . levothyroxine (SYNTHROID) 200 MCG tablet TAKE 1 TABLET(200 MCG) BY MOUTH DAILY BEFORE BREAKFAST 90 tablet 0  . loratadine (CLARITIN) 10 MG tablet TAKE 1 TABLET(10 MG) BY MOUTH DAILY AS NEEDED FOR ALLERGIES 30 tablet 2  . Magnesium Oxide 400 (240 Mg) MG TABS Take 1 tablet (400 mg total) by mouth 2 (two) times daily. 180 tablet 0  . metFORMIN (GLUCOPHAGE) 500 MG tablet TAKE 1 TABLET BY MOUTH TWICE DAILY 180 tablet  0  . nitroGLYCERIN (NITROSTAT) 0.4 MG SL tablet PLACE 1 TABLET UNDER THE TONGUE EVERY 5 MINUTES AS NEEDED FOR CHEST PAIN. CALL 911 AT THIRD DOSE IN 15 MINUTES 25 tablet 1  . ONETOUCH ULTRA test strip USE TO TEST ONCE DAILY 100 strip 1  . oxyCODONE-acetaminophen (PERCOCET) 10-325 MG tablet Take 1 tablet by mouth every 8 (eight) hours as needed for pain. 90 tablet 0  . pantoprazole (PROTONIX) 40 MG tablet TAKE 1 TABLET(40 MG) BY MOUTH DAILY 30 tablet 3  . silodosin (RAPAFLO) 8 MG CAPS capsule Take 1 capsule (8 mg total) by mouth 2 (two) times daily. 60 capsule 11  . SIMVASTATIN PO     . sotalol (BETAPACE) 160 MG tablet TAKE 1 TABLET(160 MG) BY MOUTH TWICE DAILY 180 tablet 2  . Vitamin D, Ergocalciferol, (DRISDOL) 1.25 MG (50000 UNIT) CAPS capsule TAKE 1 CAPSULE BY MOUTH EVERY 7 DAYS 12 capsule 0   No current facility-administered medications for this visit.     Past Medical History:  Diagnosis Date  . Arteriosclerotic cardiovascular disease (ASCVD) 2005   catheterization in 10/2010:50% mid LAD, diffuse distal disease, circumflex irregularities, large dominant RCA with a 50% ostial, 70% distal, 60% posterolateral and 70% PDA; normal EF  . Arthritis   . Benign prostatic hypertrophy   .  Bilateral carpal tunnel syndrome 07/03/2018  . Cerebrovascular disease 2010   R. carotid endarterectomy; Duplex in 10/2010-widely patent ICAs, subtotal left vertebral-not thought to be contributing to symptoms  . Cervical spine disease    CT in 2012-advanced degeneration and spondylosis with moderate spinal stenosis at C3-C6  . CHF (congestive heart failure) ([place])   . Depression   . Diabetes mellitus without complication ([place])   . Erectile dysfunction   . Family history of breast cancer   . Family history of cancer of mouth   . Family history of CML (chronic monocytic leukemia)   . Family history of lung cancer   . Family history of ovarian cancer   . Family history of stomach cancer   . Gastroesophageal  reflux disease   . H/O hiatal hernia   . H/O: substance abuse ([place])    Cocaine, marijuana, alcohol.  Quit 2013.   [name] Hyperlipidemia   . Hypertension   . Non-ST elevation myocardial infarction (NSTEMI), initial episode of care [facility]) 12/02/2013   DES LAD  . Obesity   . Prostate cancer ([place])   . Sleep apnea    CPAP  . Tachy-brady syndrome ([place])    a. s/p STJ dual chamber PPM   . Thyroid disease   . Tobacco abuse    Quit 2014  . 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   Past Surgical History:  Procedure Laterality Date  . BRAIN SURGERY  2015   hematoma evacuation  . BURR HOLE Right 04/13/2014   Procedure: Haskell Flirt;  Surgeon: Charlie Pitter, MD;  Location: [facility];  Service: Neurosurgery;  Laterality: Right;  . CAROTID ENDARTERECTOMY Right Feb. 25, 2010    CEA  . CORONARY ANGIOPLASTY WITH STENT PLACEMENT  12/03/2013   LAD 90%-->0% W/ Promus Premier DES 3.0 mm x 16 mm, CFX OK, RCA 40%, EF 70-75%  . LEFT ATRIAL APPENDAGE OCCLUSION N/A 08/05/2015   Procedure: LEFT ATRIAL APPENDAGE OCCLUSION;  Surgeon: [name], MD;  Location: [place] CV LAB;  Service: Cardiovascular;  Laterality: N/A;  . LEFT HEART CATHETERIZATION WITH CORONARY ANGIOGRAM Left 12/03/2013   Procedure: LEFT HEART CATHETERIZATION WITH CORONARY ANGIOGRAM;  Surgeon: Leonie Man, MD;  Location: [facility] CATH LAB;  Service: Cardiovascular;  Laterality: Left;  . LEFT HEART CATHETERIZATION WITH CORONARY ANGIOGRAM N/A 01/26/2014   Procedure: LEFT HEART CATHETERIZATION WITH CORONARY ANGIOGRAM;  Surgeon: Jettie Booze, MD;  Location: [facility] CATH LAB;  Service: Cardiovascular;  Laterality: N/A;  . LEFT HEART CATHETERIZATION WITH CORONARY ANGIOGRAM N/A 08/03/2014   Procedure: LEFT HEART CATHETERIZATION WITH CORONARY ANGIOGRAM;  Surgeon: Burnell Blanks, MD;  Location: [facility] CATH LAB;  Service: Cardiovascular;  Laterality: N/A;  .  PERCUTANEOUS CORONARY STENT INTERVENTION (PCI-S)  12/03/2013   Procedure: PERCUTANEOUS CORONARY STENT INTERVENTION (PCI-S);  Surgeon: Leonie Man, MD;  Location: [facility] CATH LAB;  Service: Cardiovascular;;  . PERMANENT PACEMAKER INSERTION N/A 09/18/2014   Procedure: PERMANENT PACEMAKER INSERTION;  Surgeon: [name], MD;  Location: [facility] CATH LAB;  Service: Cardiovascular;  Laterality: N/A;  . RADIOFREQUENCY ABLATION  2005   for PSVT  . TEE WITHOUT CARDIOVERSION N/A 07/27/2015   Procedure: TRANSESOPHAGEAL ECHOCARDIOGRAM (TEE);  Surgeon: Lelon Perla, MD;  Location: [facility] ENDOSCOPY;  Service: Cardiovascular;  Laterality: N/A;  . TEE WITHOUT CARDIOVERSION N/A 09/15/2015   Procedure: TRANSESOPHAGEAL ECHOCARDIOGRAM (TEE);  Surgeon: Thayer Headings, MD;  Location: [facility] ENDOSCOPY;  Service: Cardiovascular;  Laterality: N/A;     Family History  Problem Relation Age of Onset  . Hypertension Mother        Cerebrovascular disease  . Diabetes Mother   . Coronary artery disease Father 62  . Diabetes type II Father   . Hypertension Father   . Heart attack Father   . Diabetes Brother   . Hypertension Brother   . Diabetes Sister   . Hypertension Sister   . Heart attack Sister 60  . Leukemia Sister 79       CML  . Breast cancer Maternal Grandmother        dx 87s  . Lung cancer Maternal Uncle        dx >50, smoker  . Cancer Cousin        mouth cancer, dx 7s, no smoking/chew tobacco hx (maternal 1st cousin)  . Ovarian cancer Cousin        dx <50 (maternal 1st cousin)  . Stomach cancer Cousin        dx 57s (maternal 1st cousin)  . Cancer Cousin        type unknown to pt, dx >50 (paternal 1st cousin)     Social History   Socioeconomic History  . Marital status: Married    Spouse name: Not on file  . Number of children: 0  . Years of education: Not on file  . Highest education level: Not on file  Occupational History  . Occupation: Retired  Tobacco Use  . Smoking status: Former Smoker     Packs/day: 1.00    Years: 40.00    Pack years: 40.00    Types: Cigarettes    Start date: 10/20/1972    Quit date: 10/10/2012    Years since quitting: 7.8  . Smokeless tobacco: Never Used  . Tobacco comment: Quit in May.   Vaping Use  . Vaping Use: Never used  Substance and Sexual Activity  . Alcohol use: Yes    Alcohol/week: 0.0 standard drinks    Comment: former drinker-- sober since 2013.   . Drug use: No    Types: Cocaine    Comment: quit cocaine 10/2011  . Sexual activity: Yes    Partners: Female  Other Topics Concern  . Not on file  Social History Narrative   Lives in [place].   Social Determinants of Health   Financial Resource Strain: Low Risk   . Difficulty of Paying Living Expenses: Not hard at all  Food Insecurity: No Food Insecurity  . Worried About Charity fundraiser in the Last Year: Never true  . Ran Out of Food in the Last Year: Never true  Transportation Needs: No Transportation Needs  . Lack of Transportation (Medical): No  . Lack of Transportation (Non-Medical): No  Physical Activity: Inactive  . Days of Exercise per Week: 0 days  . Minutes of Exercise per Session: 0 min  Stress: No Stress Concern Present  . Feeling of Stress : Not at all  Social Connections: Moderately Isolated  . Frequency of Communication with Friends and Family: More than three times a week  . Frequency of Social Gatherings with Friends and Family: Once a week  . Attends Religious Services: Never  . Active Member of Clubs or Organizations: No  . Attends Archivist Meetings: Never  . Marital Status: Married  Human resources officer Violence: Not At Risk  . Fear of Current or Ex-Partner: No  . Emotionally Abused:  No  . Physically Abused: No  . Sexually Abused: No     BP 100/62   Pulse 70   Ht 6' (1.829 m)   Wt (!) 313 lb 3.2 oz (142.1 kg)   SpO2 96%   BMI 42.48 kg/m   Physical Exam:  obese appearing NAD HEENT: Unremarkable Neck:  No JVD, no  thyromegally Lymphatics:  No adenopathy Back:  No CVA tenderness Lungs:  Clear with no wheezes HEART:  Regular rate rhythm, no murmurs, no rubs, no clicks Abd:  soft, positive bowel sounds, no organomegally, no rebound, no guarding Ext:  2 plus pulses, no edema, no cyanosis, no clubbing Skin:  No rashes no nodules Neuro:  CN II through XII intact, motor grossly intact  EKG - nsr with prolonged QT  DEVICE  Normal device function.  See PaceArt for details.   Assess/Plan: 1. PAF - he is maintaining NSR very nicely but unfortunately his QT is too long. I have asked him to reduce his sotalol to 160 in the a.m and 80 mg in the p.m. 2. Obesity - he is encouraged to lose weight. 3. Sinus node dysfunction -he is asymptomatic, s/p PPM insertion. 4. PPM - his [employer] DDD PM is working normally. We will recheck in several months  Carleene Overlie Taylor,MD

## 2020-07-27 NOTE — Patient Instructions (Addendum)
Medication Instructions:  Your physician has recommended you make the following change in your medication:   1.  REDUCE your sotalol-- Take 160 mg in the AM and 80 mg in the PM  I am sending a new prescription in for 80 mg tablets.  You may continue to take your 160 mg tablets in the AM-just make sure you take the 80 mg tablets in the PM  Labwork: None ordered.  Testing/Procedures: None ordered.  Follow-Up: Your physician wants you to follow-up in: one year with [name], MD or one of the following Advanced Practice Providers on your designated Care Team:    Chanetta Marshall, NP  Tommye Standard, PA-C  Legrand Como "Jonni Sanger" [place], [place]  Remote monitoring is used to monitor your Pacemaker from home. This monitoring reduces the number of office visits required to check your device to one time per year. It allows [place] to keep an eye on the functioning of your device to ensure it is working properly. You are scheduled for a device check from home on 08/11/2020. You may send your transmission at any time that day. If you have a wireless device, the transmission will be sent automatically. After your physician reviews your transmission, you will receive a postcard with your next transmission date.  Any Other Special Instructions Will Be Listed Below (If Applicable).  If you need a refill on your cardiac medications before your next appointment, please call your pharmacy.

## 2020-07-28 DIAGNOSIS — H524 Presbyopia: Secondary | ICD-10-CM | POA: Diagnosis not present

## 2020-07-28 DIAGNOSIS — H5213 Myopia, bilateral: Secondary | ICD-10-CM | POA: Diagnosis not present

## 2020-07-29 ENCOUNTER — Other Ambulatory Visit: Payer: Self-pay | Admitting: *Deleted

## 2020-07-29 NOTE — Telephone Encounter (Signed)
Received call from patient.   Requested refill on Oxycodone/APAP.   Ok to refill??  Last office visit 06/02/2020.  Last refill 06/29/2020.

## 2020-07-30 MED ORDER — OXYCODONE-ACETAMINOPHEN 10-325 MG PO TABS: 1.0000 | ORAL_TABLET | Freq: Three times a day (TID) | ORAL | 0 refills | Status: DC | PRN

## 2020-08-04 ENCOUNTER — Ambulatory Visit (INDEPENDENT_AMBULATORY_CARE_PROVIDER_SITE_OTHER): Payer: Medicare Other | Admitting: Nurse Practitioner

## 2020-08-04 ENCOUNTER — Other Ambulatory Visit: Payer: Self-pay

## 2020-08-04 ENCOUNTER — Encounter: Payer: Self-pay | Admitting: Nurse Practitioner

## 2020-08-04 VITALS — BP 96/59 | HR 67 | Ht 72.0 in | Wt 315.8 lb

## 2020-08-04 DIAGNOSIS — E890 Postprocedural hypothyroidism: Secondary | ICD-10-CM

## 2020-08-04 DIAGNOSIS — E782 Mixed hyperlipidemia: Secondary | ICD-10-CM

## 2020-08-04 DIAGNOSIS — E1165 Type 2 diabetes mellitus with hyperglycemia: Secondary | ICD-10-CM | POA: Diagnosis not present

## 2020-08-04 DIAGNOSIS — I10 Essential (primary) hypertension: Secondary | ICD-10-CM | POA: Diagnosis not present

## 2020-08-04 DIAGNOSIS — E559 Vitamin D deficiency, unspecified: Secondary | ICD-10-CM

## 2020-08-04 LAB — POCT GLYCOSYLATED HEMOGLOBIN (HGB A1C): HbA1c, POC (controlled diabetic range): 6.9 % (ref 0.0–7.0)

## 2020-08-04 NOTE — Progress Notes (Signed)
08/04/2020                                                 Endocrinology Follow Up  Subjective:    Patient ID: Peter Dunlap, male    DOB: 1963/10/01, PCP [name], MD  Patient presents today for follow-up of hypothyroidism following radioactive iodine treatment, and type 2 diabetes.  Past Medical History:  Diagnosis Date  . Arteriosclerotic cardiovascular disease (ASCVD) 2005   catheterization in 10/2010:50% mid LAD, diffuse distal disease, circumflex irregularities, large dominant RCA with a 50% ostial, 70% distal, 60% posterolateral and 70% PDA; normal EF  . Arthritis   . Benign prostatic hypertrophy   . Bilateral carpal tunnel syndrome 07/03/2018  . Cerebrovascular disease 2010   R. carotid endarterectomy; Duplex in 10/2010-widely patent ICAs, subtotal left vertebral-not thought to be contributing to symptoms  . Cervical spine disease    CT in 2012-advanced degeneration and spondylosis with moderate spinal stenosis at C3-C6  . CHF (congestive heart failure) ([place])   . Depression   . Diabetes mellitus without complication ([place])   . Erectile dysfunction   . Family history of breast cancer   . Family history of cancer of mouth   . Family history of CML (chronic monocytic leukemia)   . Family history of lung cancer   . Family history of ovarian cancer   . Family history of stomach cancer   . Gastroesophageal reflux disease   . H/O hiatal hernia   . H/O: substance abuse ([place])    Cocaine, marijuana, alcohol.  Quit 2013.   [name] Hyperlipidemia   . Hypertension   . Non-ST elevation myocardial infarction (NSTEMI), initial episode of care [facility]) 12/02/2013   DES LAD  . Obesity   . Prostate cancer ([place])   . Sleep apnea    CPAP  . Tachy-brady syndrome ([place])    a. s/p STJ dual chamber PPM   . Thyroid disease   . Tobacco abuse    Quit 2014  . 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 BRAIN SURGERY  2015    hematoma evacuation  . BURR HOLE Right 04/13/2014   Procedure: Haskell Flirt;  Surgeon: Charlie Pitter, MD;  Location: [place] NEURO ORS;  Service: Neurosurgery;  Laterality: Right;  . CAROTID ENDARTERECTOMY Right Feb. 25, 2010    CEA  . CORONARY ANGIOPLASTY WITH STENT PLACEMENT  12/03/2013   LAD 90%-->0% W/ Promus Premier DES 3.0 mm x 16 mm, CFX OK, RCA 40%, EF 70-75%  . LEFT ATRIAL APPENDAGE OCCLUSION N/A 08/05/2015   Procedure: LEFT ATRIAL APPENDAGE OCCLUSION;  Surgeon: [name], MD;  Location: [place] CV LAB;  Service: Cardiovascular;  Laterality: N/A;  . LEFT HEART CATHETERIZATION WITH CORONARY ANGIOGRAM Left 12/03/2013   Procedure: LEFT HEART CATHETERIZATION WITH CORONARY ANGIOGRAM;  Surgeon: Leonie Man, MD;  [facility] C[facility] LAB;  Service: Cardiovascular;  Laterality: Left;  . LEFT HEART CATHETERIZATION WITH CORONARY ANGIOGRAM N/A 01/26/2014   Procedure: LEFT HEART CATHETERIZATION WITH CORONARY ANGIOGRAM;  Surgeon: Jettie Booze, MD;  Location: [facility] CATH LAB;  Service: Cardiovascular;  Laterality: N/A;  . LEFT HEART CATHETERIZATION WITH CORONARY ANGIOGRAM N/A 08/03/2014   Procedure: LEFT HEART CATHETERIZATION WITH CORONARY ANGIOGRAM;  Surgeon: Burnell Blanks, MD;  Location: [facility] CATH LAB;  Service: Cardiovascular;  Laterality: N/A;  . PERCUTANEOUS CORONARY STENT INTERVENTION (PCI-S)  12/03/2013   Procedure: PERCUTANEOUS CORONARY STENT INTERVENTION (PCI-S);  Surgeon: Leonie Man, MD;  Location: [facility] CATH LAB;  Service: Cardiovascular;;  . PERMANENT PACEMAKER INSERTION N/A 09/18/2014   Procedure: PERMANENT PACEMAKER INSERTION;  Surgeon: [name], MD;  Location: [facility] CATH LAB;  Service: Cardiovascular;  Laterality: N/A;  . RADIOFREQUENCY ABLATION  2005   for PSVT  . TEE WITHOUT CARDIOVERSION N/A 07/27/2015   Procedure: TRANSESOPHAGEAL ECHOCARDIOGRAM (TEE);  Surgeon: Lelon Perla, MD;  Location: [facility] ENDOSCOPY;  Service: Cardiovascular;  Laterality: N/A;  . TEE WITHOUT  CARDIOVERSION N/A 09/15/2015   Procedure: TRANSESOPHAGEAL ECHOCARDIOGRAM (TEE);  Surgeon: Thayer Headings, MD;  Location: [facility] ENDOSCOPY;  Service: Cardiovascular;  Laterality: N/A;   Social History   Socioeconomic History  . Marital status: Married    Spouse name: Not on file  . Number of children: 0  . Years of education: Not on file  . Highest education level: Not on file  Occupational History  . Occupation: Retired  Tobacco Use  . Smoking status: Former Smoker    Packs/day: 1.00    Years: 40.00    Pack years: 40.00    Types: Cigarettes    Start date: 10/20/1972    Quit date: 10/10/2012    Years since quitting: 7.8  . Smokeless tobacco: Never Used  . Tobacco comment: Quit in May.   Vaping Use  . Vaping Use: Never used  Substance and Sexual Activity  . Alcohol use: Yes    Alcohol/week: 0.0 standard drinks    Comment: former drinker-- sober since 2013.   . Drug use: No    Types: Cocaine    Comment: quit cocaine 10/2011  . Sexual activity: Yes    Partners: Female  Other Topics Concern  . Not on file  Social History Narrative   Lives in [place].   Social Determinants of Health   Financial Resource Strain: Low Risk   . Difficulty of Paying Living Expenses: Not hard at all  Food Insecurity: No Food Insecurity  . Worried About Charity fundraiser in the Last Year: Never true  . Ran Out of Food in the Last Year: Never true  Transportation Needs: No Transportation Needs  . Lack of Transportation (Medical): No  . Lack of Transportation (Non-Medical): No  Physical Activity: Inactive  . Days of Exercise per Week: 0 days  . Minutes of Exercise per Session: 0 min  Stress: No Stress Concern Present  . Feeling of Stress : Not at all  Social Connections: Moderately Isolated  . Frequency of Communication with Friends and Family: More than three times a week  . Frequency of Social Gatherings with Friends and Family: Once a week  . Attends Religious Services: Never  . Active  Member of Clubs or Organizations: No  . Attends Archivist Meetings: Never  . Marital Status: Married   Outpatient Encounter Medications as of 08/04/2020  Medication Sig  . allopurinol (ZYLOPRIM) 100 MG tablet TAKE 1 TABLET(100 MG) BY MOUTH DAILY  . aspirin EC 325 MG tablet Take 1 tablet (325 mg total) daily by mouth.  . Blood Glucose Monitoring Suppl (BLOOD GLUCOSE SYSTEM PAK) KIT Use as directed to monitor FSBS 1x daily. Dx: E11.9.  [name] colchicine 0.6 MG tablet   . Cyanocobalamin (VITAMIN B 12) 500 MCG TABS Take by mouth.  . DEXILANT 60 MG capsule TAKE 1 CAPSULE(60 MG) BY MOUTH DAILY  .  ERLEADA 60 MG tablet TAKE 4 TABLETS (240 MG TOTAL) BY MOUTH DAILY. MAY BE TAKEN WITH OR WITHOUT FOOD. SWALLOW TABLETS WHOLE.  . fluticasone (FLONASE) 50 MCG/ACT nasal spray Place 2 sprays into both nostrils daily.  . furosemide (LASIX) 20 MG tablet TAKE 3 TABLETS BY MOUTH DAILY  . glipiZIDE (GLUCOTROL XL) 5 MG 24 hr tablet TAKE 1 TABLET(5 MG) BY MOUTH DAILY WITH BREAKFAST  . isosorbide mononitrate (IMDUR) 60 MG 24 hr tablet TAKE 1 TABLET BY MOUTH DAILY  . levothyroxine (SYNTHROID) 200 MCG tablet TAKE 1 TABLET(200 MCG) BY MOUTH DAILY BEFORE BREAKFAST  . loratadine (CLARITIN) 10 MG tablet TAKE 1 TABLET(10 MG) BY MOUTH DAILY AS NEEDED FOR ALLERGIES  . Magnesium Oxide 400 (240 Mg) MG TABS Take 1 tablet (400 mg total) by mouth 2 (two) times daily.  . metFORMIN (GLUCOPHAGE) 500 MG tablet TAKE 1 TABLET BY MOUTH TWICE DAILY  . nitroGLYCERIN (NITROSTAT) 0.4 MG SL tablet PLACE 1 TABLET UNDER THE TONGUE EVERY 5 MINUTES AS NEEDED FOR CHEST PAIN. CALL 911 AT THIRD DOSE IN 15 MINUTES  . ONETOUCH ULTRA test strip USE TO TEST ONCE DAILY  . oxyCODONE-acetaminophen (PERCOCET) 10-325 MG tablet Take 1 tablet by mouth every 8 (eight) hours as needed for pain.  . pantoprazole (PROTONIX) 40 MG tablet TAKE 1 TABLET(40 MG) BY MOUTH DAILY  . silodosin (RAPAFLO) 8 MG CAPS capsule Take 1 capsule (8 mg total) by mouth 2 (two)  times daily.  [name] SIMVASTATIN PO   . sotalol (BETAPACE) 80 MG tablet Take 2 tablets (160 mg) in the AM and 1 tablet (80 mg) in the PM  . Vitamin D, Ergocalciferol, (DRISDOL) 1.25 MG (50000 UNIT) CAPS capsule TAKE 1 CAPSULE BY MOUTH EVERY 7 DAYS   No facility-administered encounter medications on file as of 08/04/2020.   ALLERGIES: Allergies  Allergen Reactions  . Trazodone And Nefazodone     Nightmares  . Lactose Intolerance (Gi) Other (See Comments)    UPSET STOMACH    VACCINATION STATUS: Immunization History  Administered Date(s) Administered  . Influenza,inj,Quad PF,6+ Mos 02/17/2014, 03/09/2015, 03/10/2016, 03/27/2017, 03/20/2018, 03/04/2019, 03/15/2020  . Moderna Sars-Covid-2 Vaccination 08/03/2019, 09/06/2019  . Pneumococcal Polysaccharide-23 01/05/2014  . Tdap 12/19/2010  . Zoster Recombinat (Shingrix) 12/17/2019, 02/15/2020    Diabetes He presents for his follow-up diabetic visit. He has type 2 diabetes mellitus. His disease course has been stable. There are no hypoglycemic associated symptoms. Pertinent negatives for hypoglycemia include no headaches or tremors. Pertinent negatives for diabetes include no chest pain, no fatigue, no foot paresthesias, no polydipsia and no polyuria. There are no hypoglycemic complications. Symptoms are stable. Diabetic complications include heart disease, impotence and peripheral neuropathy. Risk factors for coronary artery disease include sedentary lifestyle, hypertension, diabetes mellitus, dyslipidemia, male sex, obesity and tobacco exposure. Current diabetic treatment includes oral agent (dual therapy). He is compliant with treatment most of the time. His weight is fluctuating minimally. He is following a generally unhealthy diet. When asked about meal planning, he reported none. He has not had a previous visit with a dietitian. He never participates in exercise. (He presents today with no meter or logs to review (he left them on the table at home).   His POCT A1c today is 6.9%, essentially unchanged from previous visit of 7%.  He denies any significant hypoglycemia.  He is still undergoing treatment for prostate cancer.) An ACE inhibitor/angiotensin II receptor blocker is not being taken. He does not see a podiatrist.Eye exam is not current.  Thyroid  Problem Presents for follow-up (He was given RAI therapy on March 16, 2014. - he denies family history of thyroid dysfunction .) visit. Patient reports no cold intolerance, constipation, diarrhea, fatigue, heat intolerance, palpitations or tremors. The symptoms have been stable.    Review of systems  Constitutional: + Minimally fluctuating body weight,  current Body mass index is 42.83 kg/m. , no fatigue, no subjective hyperthermia, no subjective hypothermia Eyes: no blurry vision, no xerophthalmia ENT: no sore throat, no nodules palpated in throat, no dysphagia/odynophagia, no hoarseness Cardiovascular: no chest pain, no shortness of breath, no palpitations, no leg swelling Respiratory: no cough, no shortness of breath Gastrointestinal: no nausea/vomiting/diarrhea Musculoskeletal: no muscle/joint aches Neurological: no tremors, no numbness, no tingling, + intermittent dizziness with position changes Psychiatric: no depression, no anxiety  Objective:    BP (!) 96/59 (BP Location: Left Arm)   Pulse 67   Ht 6' (1.829 m)   Wt (!) 315 lb 12.8 oz (143.2 kg)   BMI 42.83 kg/m   Wt Readings from Last 3 Encounters:  08/04/20 (!) 315 lb 12.8 oz (143.2 kg)  07/27/20 (!) 313 lb 3.2 oz (142.1 kg)  07/15/20 (!) 320 lb 6 oz (145.3 kg)    BP Readings from Last 3 Encounters:  08/04/20 (!) 96/59  07/27/20 100/62  07/15/20 126/75    Physical Exam- Limited  Constitutional:  Body mass index is 42.83 kg/m. , not in acute distress, normal state of mind Eyes:  EOMI, no exophthalmos Neck: Supple Cardiovascular: RRR, no murmers, rubs, or gallops, no edema Respiratory: Adequate breathing efforts,  no crackles, rales, rhonchi, or wheezing Musculoskeletal: no gross deformities, strength intact in all four extremities, no gross restriction of joint movements Skin:  no rashes, no hyperemia Neurological: no tremor with outstretched hands    POCT ABI Results 08/04/20   Right ABI:  1.32      Left ABI:  1.38  Right leg systolic / diastolic: 810/17 mmHg Left leg systolic / diastolic: 510/25 mmHg  Arm systolic / diastolic: 85/27 mmHG  Detailed report will be scanned into patient chart.  Diabetic Labs (most recent): Lab Results  Component Value Date   HGBA1C 6.9 08/04/2020   HGBA1C 7.0 (H) 04/19/2020   HGBA1C 7.4 (H) 03/15/2020     Lipid Panel ( most recent) Lipid Panel     Component Value Date/Time   CHOL 122 03/30/2020 0735   TRIG 120 03/30/2020 0735   HDL 37 (L) 03/30/2020 0735   CHOLHDL 3.3 03/30/2020 0735   VLDL 28 10/17/2016 0819   LDLCALC 64 03/30/2020 0735    Recent Results (from the past 2160 hour(s))  CUP PACEART REMOTE DEVICE CHECK     Status: None   Collection Time: 05/12/20  8:00 AM  Result Value Ref Range   Date Time Interrogation Session [id number]    Pulse Generator Manufacturer [employer]    Pulse Gen Model 2240 Assurity    Pulse Gen Serial Number [id number]    Clinic Name [facility] Pulse Generator Type Implantable Pulse Generator    Implantable Pulse Generator Implant Date [id number]    Implantable Lead Manufacturer [facility]    Implantable Lead Model 1688TC Tendril SDX    Implantable Lead Serial Number [id number]    Implantable Lead Implant Date [id number]    Implantable Lead Location Detail 1 UNKNOWN    Implantable Lead Location [id number]    Implantable Lead Manufacturer [employer]    Implantable Lead Model 1888TC Tendril ST Optim  Implantable Lead Serial Number [id number]    Implantable Lead Implant Date [id number]    Implantable Lead Location Detail 1 UNKNOWN    Implantable Lead Location [phone]    Lead Channel Setting Sensing Sensitivity 2.0  mV   Lead Channel Setting Sensing Adaptation Mode Fixed Pacing    Lead Channel Setting Pacing Amplitude 2.0 V   Lead Channel Setting Pacing Pulse Width 0.5 ms   Lead Channel Setting Pacing Amplitude 2.5 V   Lead Channel Status NULL    Lead Channel Impedance Value 410 ohm   Lead Channel Sensing Intrinsic Amplitude 4.7 mV   Lead Channel Pacing Threshold Amplitude 0.5 V   Lead Channel Pacing Threshold Pulse Width 0.5 ms   Lead Channel Status NULL    Lead Channel Impedance Value 550 ohm   Lead Channel Sensing Intrinsic Amplitude 12.0 mV   Lead Channel Pacing Threshold Amplitude 0.75 V   Lead Channel Pacing Threshold Pulse Width 0.5 ms   Battery Status MOS    Battery Remaining Longevity 120 mo   Battery Remaining Percentage 95.5 %   Battery Voltage 2.96 V   Brady Statistic RA Percent Paced 7.5 %   Brady Statistic RV Percent Paced 1.0 %   Brady Statistic AP VP Percent 1.0 %   Brady Statistic AS VP Percent 1.0 %   Brady Statistic AP VS Percent 7.5 %   Brady Statistic AS VS Percent 92.0 %   Eval Rhythm SR at 62 bpm   Comprehensive metabolic panel     Status: Abnormal   Collection Time: 05/13/20 11:15 AM  Result Value Ref Range   Sodium 136 135 - 145 mmol/L   Potassium 4.1 3.5 - 5.1 mmol/L   Chloride 104 98 - 111 mmol/L   CO2 23 22 - 32 mmol/L   Glucose, Bld 111 (H) 70 - 99 mg/dL    Comment: Glucose reference range applies only to samples taken after fasting for at least 8 hours.   BUN 14 8 - 23 mg/dL   Creatinine, Ser 1.08 0.61 - 1.24 mg/dL   Calcium 9.0 8.9 - 10.3 mg/dL   Total Protein 7.8 6.5 - 8.1 g/dL   Albumin 3.8 3.5 - 5.0 g/dL   AST 32 15 - 41 U/L   ALT 16 0 - 44 U/L   Alkaline Phosphatase 66 38 - 126 U/L   Total Bilirubin 1.1 0.3 - 1.2 mg/dL   GFR, Estimated >60 >60 mL/min    Comment: (NOTE) Calculated using the CKD-EPI Creatinine Equation (2021)    Anion gap 9 5 - 15    Comment: Performed at [facility], [address], [place], [place] [address]  CBC with  Differential     Status: Abnormal   Collection Time: 05/13/20 11:15 AM  Result Value Ref Range   WBC 5.2 4.0 - 10.5 K/uL   RBC 4.36 4.22 - 5.81 MIL/uL   Hemoglobin 14.8 13.0 - 17.0 g/dL   HCT 43.7 39.0 - 52.0 %   MCV 100.2 (H) 80.0 - 100.0 fL   MCH 33.9 26.0 - 34.0 pg   MCHC 33.9 30.0 - 36.0 g/dL   RDW 13.5 11.5 - 15.5 %   Platelets 116 (L) 150 - 400 K/uL    Comment: PLATELET COUNT CONFIRMED BY SMEAR SPECIMEN CHECKED FOR CLOTS    nRBC 0.0 0.0 - 0.2 %   Neutrophils Relative % 49 %   Neutro Abs 2.6 1.7 - 7.7 K/uL   Lymphocytes Relative 39 %   Lymphs  Abs 2.0 0.7 - 4.0 K/uL   Monocytes Relative 9 %   Monocytes Absolute 0.5 0.1 - 1.0 K/uL   Eosinophils Relative 2 %   Eosinophils Absolute 0.1 0.0 - 0.5 K/uL   Basophils Relative 1 %   Basophils Absolute 0.1 0.0 - 0.1 K/uL   Immature Granulocytes 0 %   Abs Immature Granulocytes 0.01 0.00 - 0.07 K/uL    Comment: Performed at [facility], [address], [place], [place] [address]  PSA     Status: None   Collection Time: 05/13/20 11:15 AM  Result Value Ref Range   Prostatic Specific Antigen 0.26 0.00 - 4.00 ng/mL    Comment: (NOTE) While PSA levels of <=4.0 ng/ml are reported as reference range, some men with levels below 4.0 ng/ml can have prostate cancer and many men with PSA above 4.0 ng/ml do not have prostate cancer.  Other tests such as free PSA, age specific reference ranges, PSA velocity and PSA doubling time may be helpful especially in men less than 27 years old. Performed at [facility], [place] [address] [place], [place] [address]   PSA     Status: None   Collection Time: 05/20/20  9:27 AM  Result Value Ref Range   Prostatic Specific Antigen 0.15 0.00 - 4.00 ng/mL    Comment: (NOTE) While PSA levels of <=4.0 ng/ml are reported as reference range, some men with levels below 4.0 ng/ml can have prostate cancer and many men with PSA above 4.0 ng/ml do not have prostate cancer.  Other tests such as free PSA, age  specific reference ranges, PSA velocity and PSA doubling time may be helpful especially in men less than 68 years old. Performed at [facility], [place] [address] [place], [place] [address]   CBC with Differential     Status: Abnormal   Collection Time: 05/20/20  9:27 AM  Result Value Ref Range   WBC 4.7 4.0 - 10.5 K/uL   RBC 4.20 (L) 4.22 - 5.81 MIL/uL   Hemoglobin 14.5 13.0 - 17.0 g/dL   HCT 42.4 39.0 - 52.0 %   MCV 101.0 (H) 80.0 - 100.0 fL   MCH 34.5 (H) 26.0 - 34.0 pg   MCHC 34.2 30.0 - 36.0 g/dL   RDW 13.3 11.5 - 15.5 %   Platelets 101 (L) 150 - 400 K/uL    Comment: SPECIMEN CHECKED FOR CLOTS Immature Platelet Fraction may be clinically indicated, consider ordering this additional test [id number] CONSISTENT WITH PREVIOUS RESULT    nRBC 0.0 0.0 - 0.2 %   Neutrophils Relative % 52 %   Neutro Abs 2.4 1.7 - 7.7 K/uL   Lymphocytes Relative 37 %   Lymphs Abs 1.7 0.7 - 4.0 K/uL   Monocytes Relative 8 %   Monocytes Absolute 0.4 0.1 - 1.0 K/uL   Eosinophils Relative 2 %   Eosinophils Absolute 0.1 0.0 - 0.5 K/uL   Basophils Relative 1 %   Basophils Absolute 0.1 0.0 - 0.1 K/uL   Immature Granulocytes 0 %   Abs Immature Granulocytes 0.00 0.00 - 0.07 K/uL    Comment: Performed at [facility], [address], [place],  [address]  Comprehensive metabolic panel     Status: Abnormal   Collection Time: 05/20/20  9:27 AM  Result Value Ref Range   Sodium 133 (L) 135 - 145 mmol/L   Potassium 3.9 3.5 - 5.1 mmol/L   Chloride 103 98 - 111 mmol/L   CO2 20 (  L) 22 - 32 mmol/L   Glucose, Bld 215 (H) 70 - 99 mg/dL    Comment: Glucose reference range applies only to samples taken after fasting for at least 8 hours.   BUN 14 8 - 23 mg/dL   Creatinine, Ser 1.20 0.61 - 1.24 mg/dL   Calcium 8.9 8.9 - 10.3 mg/dL   Total Protein 7.8 6.5 - 8.1 g/dL   Albumin 3.8 3.5 - 5.0 g/dL   AST 34 15 - 41 U/L   ALT 16 0 - 44 U/L   Alkaline Phosphatase 78 38 - 126 U/L   Total Bilirubin 1.0 0.3  - 1.2 mg/dL   GFR, Estimated >60 >60 mL/min    Comment: (NOTE) Calculated using the CKD-EPI Creatinine Equation (2021)    Anion gap 10 5 - 15    Comment: Performed at [facility], [address] [place], [place] [address]  Testosterone     Status: Abnormal   Collection Time: 06/01/20 12:00 AM  Result Value Ref Range   Testosterone 8 (L) 264 - 916 ng/dL    Comment: Adult male reference interval is based on a population of healthy nonobese males (BMI <30) between 56 and 37 years old. [place], [place] [phone]. PMID: [id number].   Urinalysis, Routine w reflex microscopic     Status: Abnormal   Collection Time: 06/01/20  2:09 PM  Result Value Ref Range   Specific Gravity, UA 1.015 1.005 - 1.030   pH, UA 5.5 5.0 - 7.5   Color, UA Yellow Yellow   Appearance Ur Clear Clear   Leukocytes,UA 1+ (A) Negative   Protein,UA Negative Negative/Trace   Glucose, UA Negative Negative   Ketones, UA Negative Negative   RBC, UA Trace (A) Negative   Bilirubin, UA Negative Negative   Urobilinogen, Ur 1.0 0.2 - 1.0 mg/dL   Nitrite, UA Negative Negative   Microscopic Examination See below:   Microscopic Examination     Status: Abnormal   Collection Time: 06/01/20  2:09 PM   Urine  Result Value Ref Range   WBC, UA 6-10 (A) 0 - 5 /hpf   RBC 0-2 0 - 2 /hpf   Epithelial Cells (non renal) None seen 0 - 10 /hpf   Renal Epithel, UA None seen None seen /hpf   Bacteria, UA None seen None seen/Few  CBC with Differential/Platelet     Status: Abnormal   Collection Time: 06/17/20  9:51 AM  Result Value Ref Range   WBC 5.6 4.0 - 10.5 K/uL   RBC 4.13 (L) 4.22 - 5.81 MIL/uL   Hemoglobin 14.2 13.0 - 17.0 g/dL   HCT 41.8 39.0 - 52.0 %   MCV 101.2 (H) 80.0 - 100.0 fL   MCH 34.4 (H) 26.0 - 34.0 pg   MCHC 34.0 30.0 - 36.0 g/dL   RDW 14.6 11.5 - 15.5 %   Platelets 119 (L) 150 - 400 K/uL    Comment: PLATELET COUNT CONFIRMED BY SMEAR SPECIMEN CHECKED FOR CLOTS    nRBC 0.0 0.0 - 0.2 %    Neutrophils Relative % 53 %   Neutro Abs 2.9 1.7 - 7.7 K/uL   Lymphocytes Relative 34 %   Lymphs Abs 1.9 0.7 - 4.0 K/uL   Monocytes Relative 10 %   Monocytes Absolute 0.6 0.1 - 1.0 K/uL   Eosinophils Relative 2 %   Eosinophils Absolute 0.1 0.0 - 0.5 K/uL   Basophils Relative 1 %   Basophils Absolute 0.1 0.0 - 0.1 K/uL  Immature Granulocytes 0 %   Abs Immature Granulocytes 0.00 0.00 - 0.07 K/uL    Comment: Performed at [facility], [address] [place], [place] [address]  Comprehensive metabolic panel     Status: Abnormal   Collection Time: 06/17/20  9:51 AM  Result Value Ref Range   Sodium 136 135 - 145 mmol/L   Potassium 4.2 3.5 - 5.1 mmol/L   Chloride 106 98 - 111 mmol/L   CO2 21 (L) 22 - 32 mmol/L   Glucose, Bld 162 (H) 70 - 99 mg/dL    Comment: Glucose reference range applies only to samples taken after fasting for at least 8 hours.   BUN 9 8 - 23 mg/dL   Creatinine, Ser 1.12 0.61 - 1.24 mg/dL   Calcium 9.1 8.9 - 10.3 mg/dL   Total Protein 8.0 6.5 - 8.1 g/dL   Albumin 3.5 3.5 - 5.0 g/dL   AST 46 (H) 15 - 41 U/L   ALT 24 0 - 44 U/L   Alkaline Phosphatase 126 38 - 126 U/L   Total Bilirubin 1.9 (H) 0.3 - 1.2 mg/dL   GFR, Estimated >60 >60 mL/min    Comment: (NOTE) Calculated using the CKD-EPI Creatinine Equation (2021)    Anion gap 9 5 - 15    Comment: Performed at [facility], [address], [place], [place] [address]  PSA     Status: None   Collection Time: 06/17/20  9:51 AM  Result Value Ref Range   Prostatic Specific Antigen 0.06 0.00 - 4.00 ng/mL    Comment: (NOTE) While PSA levels of <=4.0 ng/ml are reported as reference range, some men with levels below 4.0 ng/ml can have prostate cancer and many men with PSA above 4.0 ng/ml do not have prostate cancer.  Other tests such as free PSA, age specific reference ranges, PSA velocity and PSA doubling time may be helpful especially in men less than 53 years old. Performed at [facility], [place] [address] [place], [place] [address]   CBC with Differential/Platelet     Status: Abnormal   Collection Time: 07/15/20 11:08 AM  Result Value Ref Range   WBC 5.3 4.0 - 10.5 K/uL   RBC 3.99 (L) 4.22 - 5.81 MIL/uL   Hemoglobin 13.6 13.0 - 17.0 g/dL   HCT 41.1 39.0 - 52.0 %   MCV 103.0 (H) 80.0 - 100.0 fL   MCH 34.1 (H) 26.0 - 34.0 pg   MCHC 33.1 30.0 - 36.0 g/dL   RDW 14.0 11.5 - 15.5 %   Platelets 104 (L) 150 - 400 K/uL    Comment: Immature Platelet Fraction may be clinically indicated, consider ordering this additional test [id number]    nRBC 0.0 0.0 - 0.2 %   Neutrophils Relative % 49 %   Neutro Abs 2.6 1.7 - 7.7 K/uL   Lymphocytes Relative 36 %   Lymphs Abs 1.9 0.7 - 4.0 K/uL   Monocytes Relative 11 %   Monocytes Absolute 0.6 0.1 - 1.0 K/uL   Eosinophils Relative 3 %   Eosinophils Absolute 0.1 0.0 - 0.5 K/uL   Basophils Relative 1 %   Basophils Absolute 0.1 0.0 - 0.1 K/uL   Immature Granulocytes 0 %   Abs Immature Granulocytes 0.01 0.00 - 0.07 K/uL    Comment: Performed at [facility], [address] [place], [place] [address]  Comprehensive metabolic panel     Status: Abnormal   Collection Time: 07/15/20 11:08 AM  Result Value  Ref Range   Sodium 135 135 - 145 mmol/L   Potassium 4.1 3.5 - 5.1 mmol/L   Chloride 104 98 - 111 mmol/L   CO2 22 22 - 32 mmol/L   Glucose, Bld 173 (H) 70 - 99 mg/dL    Comment: Glucose reference range applies only to samples taken after fasting for at least 8 hours.   BUN 9 8 - 23 mg/dL   Creatinine, Ser 0.98 0.61 - 1.24 mg/dL   Calcium 9.0 8.9 - 10.3 mg/dL   Total Protein 7.9 6.5 - 8.1 g/dL   Albumin 3.6 3.5 - 5.0 g/dL   AST 34 15 - 41 U/L   ALT 14 0 - 44 U/L   Alkaline Phosphatase 92 38 - 126 U/L   Total Bilirubin 1.3 (H) 0.3 - 1.2 mg/dL   GFR, Estimated >60 >60 mL/min    Comment: (NOTE) Calculated using the CKD-EPI Creatinine Equation (2021)    Anion gap 9 5 - 15    Comment: Performed at [facility], [address] [place], [place] [address]   PSA     Status: None   Collection Time: 07/15/20 11:08 AM  Result Value Ref Range   Prostatic Specific Antigen 0.05 0.00 - 4.00 ng/mL    Comment: (NOTE) While PSA levels of <=4.0 ng/ml are reported as reference range, some men with levels below 4.0 ng/ml can have prostate cancer and many men with PSA above 4.0 ng/ml do not have prostate cancer.  Other tests such as free PSA, age specific reference ranges, PSA velocity and PSA doubling time may be helpful especially in men less than 62 years old. 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  He is advised to continue current dose of Levothyroxine 200 mcg po daily before breakfast.  He has had trouble with taking his medication appropriately and consistently in the past.  Will recheck TFTs prior to next visit and adjust dose as needed.   - We discussed about the correct intake of his thyroid hormone, on empty stomach at fasting, with water, separated by at least 30 minutes from breakfast and other medications,  and separated by more than 4 hours from calcium, iron, multivitamins, acid reflux medications (PPIs). -Patient is made aware of the fact that thyroid hormone replacement is needed for life, dose to be adjusted by periodic monitoring of thyroid function tests.   2. Diabetes mellitus type 2 in obese [facility])  He presents today with no meter or logs to review (he left them on the table at home).  His POCT A1c today is 6.9%, essentially unchanged from previous visit of 7%.  He denies any significant hypoglycemia.  He is still undergoing treatment for  prostate cancer.  - He is advised to continue Metformin 500 mg p.o. twice daily with meals and Glipizide to 5 mg XL daily with breakfast.   -He is encouraged to monitor glucose 1-2 times per day, before breakfast and before bed, and call the clinic if he  has readings less than 70 or greater than 300 for 3 tests in a row.  -He will be considered for incretin therapy on subsequent visits.  - Nutritional counseling repeated at each appointment due to patients tendency to fall back in to old habits.  - The patient admits there is a room for improvement in their diet and drink choices. -  Suggestion is made for the patient to avoid simple carbohydrates from their diet including Cakes, Sweet Desserts / Pastries, Ice Cream, Soda (diet and regular), Sweet Tea, Candies, Chips, Cookies, Sweet Pastries,  Store Bought Juices, Alcohol in Excess of  1-2 drinks a day, Artificial Sweeteners, Coffee Creamer, and "Sugar-free" Products. This will help patient to have stable blood glucose profile and potentially avoid unintended weight gain.   - I encouraged the patient to switch to  unprocessed or minimally processed complex starch and increased protein intake (animal or plant source), fruits, and vegetables.   - Patient is advised to stick to a routine mealtimes to eat 3 meals  a day and avoid unnecessary snacks ( to snack only to correct hypoglycemia).  3. Essential hypertension His blood pressure is controlled to target (slightly low this visit, again-likely due to his cancer treatment).  He does report associated dizziness with position changes, ongoing from last visit intermittently. He is advised to monitor his BP at home and call his oncologist if BP remains low.  He is advised to continue Lasix 60 mg po daily.  4.  Hyperlipidemia:  His most recent lipid panel from 03/30/20 shows controlled LDL at 64.  He is advised to continue Simvastatin 40 mg po daily at bedtime.  Side effects and precautions discussed  with him.  5. Vitamin D Deficiency: His most recent vitamin D level on 03/30/20 shows improvement to 68.  He has completed replenishment with ergocalciferol.  No need for additional supplementation at this time.   - I advised patient to maintain close follow up with [name], MD for primary care needs.   - Time spent on this patient care encounter:  40 min, of which > 50% was spent in  counseling and the rest reviewing his blood glucose logs , discussing his hypoglycemia and hyperglycemia episodes, reviewing his current and  previous labs / studies  ( including abstraction from other facilities) and medications  doses and developing a  long term treatment plan and documenting his care.   Please refer to Patient Instructions for Blood Glucose Monitoring and Insulin/Medications Dosing Guide"  in media tab for additional information. Please  also refer to " Patient Self Inventory" in the Media  tab for reviewed elements of pertinent patient history.  [name] participated in the discussions, expressed understanding, and voiced agreement with the above plans.  All questions were answered to his satisfaction. he is encouraged to contact clinic should he have any questions or concerns prior to his return visit.    Follow up plan: Return in about 4 months (around 12/04/2020) for Diabetes follow up with A1c in office, Thyroid follow up, Previsit labs, Bring glucometer and logs.  Rayetta Pigg, [facility] [address] [place], [place] [address] Phone: [phone] Fax: [phone]   08/04/2020, 9:00 AM

## 2020-08-04 NOTE — Patient Instructions (Signed)

## 2020-08-06 DIAGNOSIS — H9313 Tinnitus, bilateral: Secondary | ICD-10-CM | POA: Diagnosis not present

## 2020-08-06 DIAGNOSIS — H903 Sensorineural hearing loss, bilateral: Secondary | ICD-10-CM | POA: Diagnosis not present

## 2020-08-10 ENCOUNTER — Other Ambulatory Visit: Payer: Self-pay | Admitting: Family Medicine

## 2020-08-11 ENCOUNTER — Ambulatory Visit (INDEPENDENT_AMBULATORY_CARE_PROVIDER_SITE_OTHER): Payer: Medicare Other

## 2020-08-11 DIAGNOSIS — I495 Sick sinus syndrome: Secondary | ICD-10-CM | POA: Diagnosis not present

## 2020-08-11 LAB — CUP PACEART REMOTE DEVICE CHECK
Battery Remaining Longevity: 127 mo
Battery Remaining Percentage: 95.5 %
Battery Voltage: 2.98 V
Brady Statistic AP VP Percent: 1 %
Brady Statistic AP VS Percent: 9.5 %
Brady Statistic AS VP Percent: 1 %
Brady Statistic AS VS Percent: 90 %
Brady Statistic RA Percent Paced: 9.2 %
Brady Statistic RV Percent Paced: 1 %
Date Time Interrogation Session: 20220309020013
Implantable Lead Implant Date: 20160415
Implantable Lead Implant Date: 20160415
Implantable Lead Location: 753859
Implantable Lead Location: 753860
Implantable Pulse Generator Implant Date: 20160415
Lead Channel Impedance Value: 430 Ohm
Lead Channel Impedance Value: 550 Ohm
Lead Channel Pacing Threshold Amplitude: 0.75 V
Lead Channel Pacing Threshold Amplitude: 0.75 V
Lead Channel Pacing Threshold Pulse Width: 0.5 ms
Lead Channel Pacing Threshold Pulse Width: 0.5 ms
Lead Channel Sensing Intrinsic Amplitude: 12 mV
Lead Channel Sensing Intrinsic Amplitude: 2.1 mV
Lead Channel Setting Pacing Amplitude: 2 V
Lead Channel Setting Pacing Amplitude: 2.5 V
Lead Channel Setting Pacing Pulse Width: 0.5 ms
Lead Channel Setting Sensing Sensitivity: 2 mV
Pulse Gen Model: 2240
Pulse Gen Serial Number: 7756161

## 2020-08-12 ENCOUNTER — Inpatient Hospital Stay (HOSPITAL_COMMUNITY): Payer: Medicare Other

## 2020-08-12 ENCOUNTER — Other Ambulatory Visit: Payer: Self-pay

## 2020-08-12 ENCOUNTER — Inpatient Hospital Stay (HOSPITAL_COMMUNITY): Payer: Medicare Other | Attending: Hematology

## 2020-08-12 ENCOUNTER — Inpatient Hospital Stay (HOSPITAL_BASED_OUTPATIENT_CLINIC_OR_DEPARTMENT_OTHER): Payer: Medicare Other | Admitting: Hematology

## 2020-08-12 VITALS — BP 104/57 | HR 64 | Temp 97.0°F | Resp 20 | Wt 318.1 lb

## 2020-08-12 DIAGNOSIS — Z79899 Other long term (current) drug therapy: Secondary | ICD-10-CM | POA: Insufficient documentation

## 2020-08-12 DIAGNOSIS — Z800 Family history of malignant neoplasm of digestive organs: Secondary | ICD-10-CM | POA: Insufficient documentation

## 2020-08-12 DIAGNOSIS — Z8249 Family history of ischemic heart disease and other diseases of the circulatory system: Secondary | ICD-10-CM | POA: Diagnosis not present

## 2020-08-12 DIAGNOSIS — Z803 Family history of malignant neoplasm of breast: Secondary | ICD-10-CM | POA: Diagnosis not present

## 2020-08-12 DIAGNOSIS — Z87891 Personal history of nicotine dependence: Secondary | ICD-10-CM | POA: Diagnosis not present

## 2020-08-12 DIAGNOSIS — Z833 Family history of diabetes mellitus: Secondary | ICD-10-CM | POA: Diagnosis not present

## 2020-08-12 DIAGNOSIS — Z801 Family history of malignant neoplasm of trachea, bronchus and lung: Secondary | ICD-10-CM | POA: Diagnosis not present

## 2020-08-12 DIAGNOSIS — R5383 Other fatigue: Secondary | ICD-10-CM | POA: Insufficient documentation

## 2020-08-12 DIAGNOSIS — I110 Hypertensive heart disease with heart failure: Secondary | ICD-10-CM | POA: Insufficient documentation

## 2020-08-12 DIAGNOSIS — C61 Malignant neoplasm of prostate: Secondary | ICD-10-CM

## 2020-08-12 DIAGNOSIS — Z5111 Encounter for antineoplastic chemotherapy: Secondary | ICD-10-CM | POA: Diagnosis not present

## 2020-08-12 DIAGNOSIS — E119 Type 2 diabetes mellitus without complications: Secondary | ICD-10-CM | POA: Insufficient documentation

## 2020-08-12 DIAGNOSIS — Z806 Family history of leukemia: Secondary | ICD-10-CM | POA: Insufficient documentation

## 2020-08-12 DIAGNOSIS — Z808 Family history of malignant neoplasm of other organs or systems: Secondary | ICD-10-CM | POA: Diagnosis not present

## 2020-08-12 DIAGNOSIS — I509 Heart failure, unspecified: Secondary | ICD-10-CM | POA: Insufficient documentation

## 2020-08-12 DIAGNOSIS — Z191 Hormone sensitive malignancy status: Secondary | ICD-10-CM | POA: Diagnosis not present

## 2020-08-12 DIAGNOSIS — R140 Abdominal distension (gaseous): Secondary | ICD-10-CM | POA: Diagnosis not present

## 2020-08-12 DIAGNOSIS — D696 Thrombocytopenia, unspecified: Secondary | ICD-10-CM | POA: Insufficient documentation

## 2020-08-12 DIAGNOSIS — E785 Hyperlipidemia, unspecified: Secondary | ICD-10-CM | POA: Insufficient documentation

## 2020-08-12 DIAGNOSIS — K7460 Unspecified cirrhosis of liver: Secondary | ICD-10-CM | POA: Diagnosis not present

## 2020-08-12 DIAGNOSIS — C778 Secondary and unspecified malignant neoplasm of lymph nodes of multiple regions: Secondary | ICD-10-CM | POA: Diagnosis not present

## 2020-08-12 LAB — COMPREHENSIVE METABOLIC PANEL
ALT: 22 U/L (ref 0–44)
AST: 48 U/L — ABNORMAL HIGH (ref 15–41)
Albumin: 3.4 g/dL — ABNORMAL LOW (ref 3.5–5.0)
Alkaline Phosphatase: 123 U/L (ref 38–126)
Anion gap: 10 (ref 5–15)
BUN: 9 mg/dL (ref 8–23)
CO2: 21 mmol/L — ABNORMAL LOW (ref 22–32)
Calcium: 9 mg/dL (ref 8.9–10.3)
Chloride: 105 mmol/L (ref 98–111)
Creatinine, Ser: 1.04 mg/dL (ref 0.61–1.24)
GFR, Estimated: >60 mL/min (ref >60)
Glucose, Bld: 170 mg/dL — ABNORMAL HIGH (ref 70–99)
Potassium: 4.1 mmol/L (ref 3.5–5.1)
Sodium: 136 mmol/L (ref 135–145)
Total Bilirubin: 1.2 mg/dL (ref 0.3–1.2)
Total Protein: 7.8 g/dL (ref 6.5–8.1)

## 2020-08-12 LAB — CBC WITH DIFFERENTIAL/PLATELET
Abs Immature Granulocytes: 0.01 10*3/uL (ref 0.00–0.07)
Basophils Absolute: 0.1 10*3/uL (ref 0.0–0.1)
Basophils Relative: 1 %
Eosinophils Absolute: 0.1 10*3/uL (ref 0.0–0.5)
Eosinophils Relative: 2 %
HCT: 40.1 % (ref 39.0–52.0)
Hemoglobin: 13.6 g/dL (ref 13.0–17.0)
Immature Granulocytes: 0 %
Lymphocytes Relative: 32 %
Lymphs Abs: 2 10*3/uL (ref 0.7–4.0)
MCH: 35.1 pg — ABNORMAL HIGH (ref 26.0–34.0)
MCHC: 33.9 g/dL (ref 30.0–36.0)
MCV: 103.4 fL — ABNORMAL HIGH (ref 80.0–100.0)
Monocytes Absolute: 0.6 10*3/uL (ref 0.1–1.0)
Monocytes Relative: 9 %
Neutro Abs: 3.5 10*3/uL (ref 1.7–7.7)
Neutrophils Relative %: 56 %
Platelets: 109 10*3/uL — ABNORMAL LOW (ref 150–400)
RBC: 3.88 MIL/uL — ABNORMAL LOW (ref 4.22–5.81)
RDW: 14.4 % (ref 11.5–15.5)
WBC: 6.2 10*3/uL (ref 4.0–10.5)
nRBC: 0 % (ref 0.0–0.2)

## 2020-08-12 LAB — PSA: Prostatic Specific Antigen: 0.04 ng/mL (ref 0.00–4.00)

## 2020-08-12 MED ORDER — DEGARELIX ACETATE 80 MG ~~LOC~~ SOLR
80.0000 mg | Freq: Once | SUBCUTANEOUS | Status: AC
  Administered 2020-08-12: 80 mg via SUBCUTANEOUS
  Filled 2020-08-12: qty 4

## 2020-08-12 NOTE — Patient Instructions (Signed)
[place] at [facility] Discharge Instructions  You were seen today by Dr. Delton Coombes. He went over your recent results. You received your [place] Koller injection today; continue getting your injection every month. Dr. Delton Coombes will see you back in 2 months for labs and follow up.   Thank you for choosing [place] at [facility] to provide your oncology and hematology care.  To afford each patient quality time with our provider, please arrive at least 15 minutes before your scheduled appointment time.   If you have a lab appointment with the [facility] please come in thru the Main Entrance and check in at the main information desk  You need to re-schedule your appointment should you arrive 10 or more minutes late.  We strive to give you quality time with our providers, and arriving late affects you and other patients whose appointments are after yours.  Also, if you no show three or more times for appointments you may be dismissed from the clinic at the providers discretion.     Again, thank you for choosing [facility].  Our hope is that these requests will decrease the amount of time that you wait before being seen by our physicians.       _____________________________________________________________  Should you have questions after your visit to [facility], please contact our office at [phone] between the hours of 8:00 a.m. and 4:30 p.m.  Voicemails left after 4:00 p.m. will not be returned until the following business day.  For prescription refill requests, have your pharmacy contact our office and allow 72 hours.    Cancer Center Support Programs:   > Cancer Support Group  2nd Tuesday of the month 1pm-2pm, Journey Room

## 2020-08-12 NOTE — Telephone Encounter (Signed)
Call placed to patient and patient made aware.   States that he will pick up tomorrow after lunch.   I am unable to print prescription for you to sign.

## 2020-08-12 NOTE — Telephone Encounter (Signed)
Call placed to patient to inquire.   Reports that he is aware medication is early, but he is requesting postdated prescription since PCP is leaving.   Please advise.

## 2020-08-12 NOTE — Progress Notes (Signed)
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 A variant of uncertain significance (VUS) was detected in the NF1 gene called c.1178A>G. The report date is 05/23/2020.  The Common Hereditary Cancers Panel offered by [facility] includes sequencing and/or deletion duplication testing of the following 47 genes: APC, ATM, AXIN2, BARD1, BMPR1A, BRCA1, BRCA2, BRIP1, CDH1, CDK4, CDKN2A (p14ARF), CDKN2A (p16INK4a), CHEK2, CTNNA1, DICER1, EPCAM (Deletion/duplication testing only), GREM1 (promoter region deletion/duplication testing only), KIT, MEN1, MLH1, MSH2, MSH3, MSH6, MUTYH, NBN, NF1, NTHL1, PALB2, PDGFRA, PMS2, POLD1, POLE, PTEN, RAD50, RAD51C, RAD51D, SDHB, SDHC, SDHD, SMAD4, SMARCA4. STK11, TP53, TSC1, TSC2, and VHL.  The following genes were evaluated for sequence changes only: SDHA and HOXB13 c.251G>A variant only. The Prostate Cancer HRR Panel offered by [facility] includes sequencing and/or deletion duplication analysis of the following 10 genes: ATM, BARD1, BRCA1, BRCA2, BRIP1, CHEK2, FANCL, PALB2, RAD51C, RAD51D.     CANCER STAGING: Cancer Staging No matching staging information was found for the patient.  INTERVAL HISTORY:  [name], a 65 y.o. male, returns for routine follow-up of his metastatic castration sensitive  prostate cancer. Giovonnie was last seen on 07/15/2020.   Today he reports feeling okay. He is tolerating Erleada 240 mg well, taking it in the morning, and he continues having some abdominal bloating, but denies having any new pains. He also has occasional hot flashes, as well as diarrhea, but denies having N/V/C or skin rashes. He takes glipizide QAM with breakfast. He denies having any recent infections or F/C. His appetite is excellent.  He saw the endocrinologist on 03/02.   REVIEW OF SYSTEMS:  Review of Systems  Constitutional: Positive for fatigue (50%). Negative for appetite change, chills and fever.  Gastrointestinal: Positive for abdominal distention (bloating) and diarrhea (occasional). Negative for constipation, nausea and vomiting.  Endocrine: Positive for hot flashes (occasional).  Musculoskeletal: Negative for arthralgias.  Skin: Negative for rash.  All other systems reviewed and are negative.   PAST MEDICAL/SURGICAL HISTORY:  Past Medical History:  Diagnosis Date  . Arteriosclerotic cardiovascular disease (ASCVD) 2005   catheterization in 10/2010:50% mid LAD, diffuse distal disease, circumflex irregularities, large dominant RCA with a 50% ostial, 70% distal, 60% posterolateral and 70% PDA; normal EF  . Arthritis   . Benign prostatic hypertrophy   . Bilateral carpal tunnel syndrome 07/03/2018  . Cerebrovascular disease 2010   R. carotid endarterectomy; Duplex in 10/2010-widely patent ICAs, subtotal left vertebral-not thought to be contributing to symptoms  . Cervical spine disease    CT in 2012-advanced degeneration and spondylosis with moderate spinal stenosis at C3-C6  . CHF (congestive heart failure) ([place])   . Depression   . Diabetes mellitus without complication ([place])   . Erectile dysfunction   . Family history of breast cancer   . Family history of cancer of mouth   . Family history  of CML (chronic monocytic leukemia)   . Family history of lung cancer   . Family  history of ovarian cancer   . Family history of stomach cancer   . Gastroesophageal reflux disease   . H/O hiatal hernia   . H/O: substance abuse ([place])    Cocaine, marijuana, alcohol.  Quit 2013.   [name] Hyperlipidemia   . Hypertension   . Non-ST elevation myocardial infarction (NSTEMI), initial episode of care [facility]) 12/02/2013   DES LAD  . Obesity   . Prostate cancer ([place])   . Sleep apnea    CPAP  . Tachy-brady syndrome ([place])    a. s/p STJ dual chamber PPM   . Thyroid disease   . Tobacco abuse    Quit 2014  . 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 BRAIN SURGERY  2015   hematoma evacuation  . BURR HOLE Right 04/13/2014   Procedure: Haskell Flirt;  Surgeon: Charlie Pitter, MD;  Location: [facility]URO ORS;  Service: Neurosurgery;  Laterality: Right;  . CAROTID ENDARTERECTOMY Right Feb. 25, 2010    CEA  . CORONARY ANGIOPLASTY WITH STENT PLACEMENT  12/03/2013   LAD 90%-->0% W/ Promus Premier DES 3.0 mm x 16 mm, CFX OK, RCA 40%, EF 70-75%  . LEFT ATRIAL APPENDAGE OCCLUSION N/A 08/05/2015   Procedure: LEFT ATRIAL APPENDAGE OCCLUSION;  Surgeon: [name], MD;  Location: [facility] CV LAB;  Service: Cardiovascular;  Laterality: N/A;  . LEFT HEART CATHETERIZATION WITH CORONARY ANGIOGRAM Left 12/03/2013   Procedure: LEFT HEART CATHETERIZATION WITH CORONARY ANGIOGRAM;  Surgeon: Leonie Man, MD;  Location[facility] CATH LAB;  Service: Cardiovascular;  Laterality: Left;  . LEFT HEART CATHETERIZATION WITH CORONARY ANGIOGRAM N/A 01/26/2014   Procedure: LEFT HEART CATHETERIZATION WITH CORONARY ANGIOGRAM;  Surgeon: Jettie Booze, MD;  Location: [facility] CATH LAB;  Service: Cardiovascular;  Laterality: N/A;  . LEFT HEART CATHETERIZATION WITH CORONARY ANGIOGRAM N/A 08/03/2014   Procedure: LEFT HEART CATHETERIZATION WITH CORONARY ANGIOGRAM;  Surgeon: Burnell Blanks, MD;  Location: [facility] CATH LAB;  Service: Cardiovascular;  Laterality: N/A;  .  PERCUTANEOUS CORONARY STENT INTERVENTION (PCI-S)  12/03/2013   Procedure: PERCUTANEOUS CORONARY STENT INTERVENTION (PCI-S);  Surgeon: Leonie Man, MD;  Location: [facility] CATH LAB;  Service: Cardiovascular;;  . PERMANENT PACEMAKER INSERTION N/A 09/18/2014   Procedure: PERMANENT PACEMAKER INSERTION;  Surgeon: [name], MD;  Location: [facility] CATH LAB;  Service: Cardiovascular;  Laterality: N/A;  . RADIOFREQUENCY ABLATION  2005   for PSVT  . TEE WITHOUT CARDIOVERSION N/A 07/27/2015   Procedure: TRANSESOPHAGEAL ECHOCARDIOGRAM (TEE);  Surgeon: Lelon Perla, MD;  Location: [facility] ENDOSCOPY;  Service: Cardiovascular;  Laterality: N/A;  . TEE WITHOUT CARDIOVERSION N/A 09/15/2015   Procedure: TRANSESOPHAGEAL ECHOCARDIOGRAM (TEE);  Surgeon: Thayer Headings, MD;  Location: [facility] ENDOSCOPY;  Service: Cardiovascular;  Laterality: N/A;    SOCIAL HISTORY:  Social History   Socioeconomic History  . Marital status: Married    Spouse name: Not on file  . Number of children: 0  . Years of education: Not on file  . Highest education level: Not on file  Occupational History  . Occupation: Retired  Tobacco Use  . Smoking status: Former Smoker    Packs/day: 1.00    Years: 40.00    Pack years: 40.00    Types: Cigarettes    Start date: 10/20/1972    Quit date: 10/10/2012    Years since quitting:  7.8  . Smokeless tobacco: Never Used  . Tobacco comment: Quit in May.   Vaping Use  . Vaping Use: Never used  Substance and Sexual Activity  . Alcohol use: Yes    Alcohol/week: 0.0 standard drinks    Comment: former drinker-- sober since 2013.   . Drug use: No    Types: Cocaine    Comment: quit cocaine 10/2011  . Sexual activity: Yes    Partners: Female  Other Topics Concern  . Not on file  Social History Narrative   Lives in [place].   Social Determinants of Health   Financial Resource Strain: Low Risk   . Difficulty of Paying Living Expenses: Not hard at all  Food Insecurity: No Food Insecurity  .  Worried About Charity fundraiser in the Last Year: Never true  . Ran Out of Food in the Last Year: Never true  Transportation Needs: No Transportation Needs  . Lack of Transportation (Medical): No  . Lack of Transportation (Non-Medical): No  Physical Activity: Inactive  . Days of Exercise per Week: 0 days  . Minutes of Exercise per Session: 0 min  Stress: No Stress Concern Present  . Feeling of Stress : Not at all  Social Connections: Moderately Isolated  . Frequency of Communication with Friends and Family: More than three times a week  . Frequency of Social Gatherings with Friends and Family: Once a week  . Attends Religious Services: Never  . Active Member of Clubs or Organizations: No  . Attends Archivist Meetings: Never  . Marital Status: Married  Human resources officer Violence: Not At Risk  . Fear of Current or Ex-Partner: No  . Emotionally Abused: No  . Physically Abused: No  . Sexually Abused: No    FAMILY HISTORY:  Family History  Problem Relation Age of Onset  . Hypertension Mother        Cerebrovascular disease  . Diabetes Mother   . Coronary artery disease Father 11  . Diabetes type II Father   . Hypertension Father   . Heart attack Father   . Diabetes Brother   . Hypertension Brother   . Diabetes Sister   . Hypertension Sister   . Heart attack Sister 15  . Leukemia Sister 43       CML  . Breast cancer Maternal Grandmother        dx 59s  . Lung cancer Maternal Uncle        dx >50, smoker  . Cancer Cousin        mouth cancer, dx 46s, no smoking/chew tobacco hx (maternal 1st cousin)  . Ovarian cancer Cousin        dx <50 (maternal 1st cousin)  . Stomach cancer Cousin        dx 53s (maternal 1st cousin)  . Cancer Cousin        type unknown to pt, dx >50 (paternal 1st cousin)    CURRENT MEDICATIONS:  Current Outpatient Medications  Medication Sig Dispense Refill  . allopurinol (ZYLOPRIM) 100 MG tablet TAKE 1 TABLET(100 MG) BY MOUTH DAILY 90  tablet 0  . aspirin EC 325 MG tablet Take 1 tablet (325 mg total) daily by mouth. 30 tablet 0  . Blood Glucose Monitoring Suppl (BLOOD GLUCOSE SYSTEM PAK) KIT Use as directed to monitor FSBS 1x daily. Dx: E11.9. 1 kit 1  . colchicine 0.6 MG tablet     . Cyanocobalamin (VITAMIN B 12) 500 MCG TABS Take by mouth.    [name]  DEXILANT 60 MG capsule TAKE 1 CAPSULE(60 MG) BY MOUTH DAILY 30 capsule 5  . ERLEADA 60 MG tablet TAKE 4 TABLETS (240 MG TOTAL) BY MOUTH DAILY. MAY BE TAKEN WITH OR WITHOUT FOOD. SWALLOW TABLETS WHOLE. 120 tablet 3  . fluticasone (FLONASE) 50 MCG/ACT nasal spray Place 2 sprays into both nostrils daily. 16 g 6  . furosemide (LASIX) 20 MG tablet TAKE 3 TABLETS BY MOUTH DAILY 270 tablet 3  . glipiZIDE (GLUCOTROL XL) 5 MG 24 hr tablet TAKE 1 TABLET(5 MG) BY MOUTH DAILY WITH BREAKFAST 90 tablet 0  . isosorbide mononitrate (IMDUR) 60 MG 24 hr tablet TAKE 1 TABLET BY MOUTH DAILY 90 tablet 2  . levothyroxine (SYNTHROID) 200 MCG tablet TAKE 1 TABLET(200 MCG) BY MOUTH DAILY BEFORE BREAKFAST 90 tablet 0  . loratadine (CLARITIN) 10 MG tablet TAKE 1 TABLET(10 MG) BY MOUTH DAILY AS NEEDED FOR ALLERGIES 30 tablet 2  . Magnesium Oxide 400 (240 Mg) MG TABS Take 1 tablet (400 mg total) by mouth 2 (two) times daily. 180 tablet 0  . metFORMIN (GLUCOPHAGE) 500 MG tablet TAKE 1 TABLET BY MOUTH TWICE DAILY 180 tablet 0  . ONETOUCH ULTRA test strip USE TO TEST ONCE DAILY 100 strip 1  . oxyCODONE-acetaminophen (PERCOCET) 10-325 MG tablet Take 1 tablet by mouth every 8 (eight) hours as needed for pain. 90 tablet 0  . pantoprazole (PROTONIX) 40 MG tablet TAKE 1 TABLET(40 MG) BY MOUTH DAILY 30 tablet 3  . silodosin (RAPAFLO) 8 MG CAPS capsule Take 1 capsule (8 mg total) by mouth 2 (two) times daily. 60 capsule 11  . simvastatin (ZOCOR) 40 MG tablet TAKE 1 TABLET(40 MG) BY MOUTH DAILY 90 tablet 1  . SIMVASTATIN PO     . sotalol (BETAPACE) 80 MG tablet Take 2 tablets (160 mg) in the AM and 1 tablet (80 mg) in the  PM 135 tablet 3  . Vitamin D, Ergocalciferol, (DRISDOL) 1.25 MG (50000 UNIT) CAPS capsule TAKE 1 CAPSULE BY MOUTH EVERY 7 DAYS 12 capsule 0  . nitroGLYCERIN (NITROSTAT) 0.4 MG SL tablet PLACE 1 TABLET UNDER THE TONGUE EVERY 5 MINUTES AS NEEDED FOR CHEST PAIN. CALL 911 AT THIRD DOSE IN 15 MINUTES (Patient not taking: Reported on 08/12/2020) 25 tablet 1   No current facility-administered medications for this visit.    ALLERGIES:  Allergies  Allergen Reactions  . Trazodone And Nefazodone     Nightmares  . Lactose Intolerance (Gi) Other (See Comments)    UPSET STOMACH     PHYSICAL EXAM:  Performance status (ECOG): 1 - Symptomatic but completely ambulatory  Vitals:   08/12/20 1248  BP: (!) 104/57  Pulse: 64  Resp: 20  Temp: (!) 97 F (36.1 C)  SpO2: 100%   Wt Readings from Last 3 Encounters:  08/12/20 (!) 318 lb 1.6 oz (144.3 kg)  08/04/20 (!) 315 lb 12.8 oz (143.2 kg)  07/27/20 (!) 313 lb 3.2 oz (142.1 kg)   Physical Exam Vitals reviewed.  Constitutional:      Appearance: Normal appearance. He is obese.  Cardiovascular:     Rate and Rhythm: Normal rate and regular rhythm.     Pulses: Normal pulses.     Heart sounds: Normal heart sounds.  Pulmonary:     Effort: Pulmonary effort is normal.     Breath sounds: Normal breath sounds.  Musculoskeletal:     Right lower leg: No edema.     Left lower leg: No edema.  Neurological:  General: No focal deficit present.     Mental Status: He is alert and oriented to person, place, and time.  Psychiatric:        Mood and Affect: Mood normal.        Behavior: Behavior normal.      LABORATORY DATA:  I have reviewed the labs as listed.  CBC Latest Ref Rng & Units 08/12/2020 07/15/2020 06/17/2020  WBC 4.0 - 10.5 K/uL 6.2 5.3 5.6  Hemoglobin 13.0 - 17.0 g/dL 13.6 13.6 14.2  Hematocrit 39.0 - 52.0 % 40.1 41.1 41.8  Platelets 150 - 400 K/uL 109(L) 104(L) 119(L)   CMP Latest Ref Rng & Units 08/12/2020 07/15/2020 06/17/2020  Glucose  70 - 99 mg/dL 170(H) 173(H) 162(H)  BUN 8 - 23 mg/dL _0 Creatinine 0.61 - 1.24 mg/dL 1.04 0.98 1.12  Sodium 135 - 145 mmol/L 136 135 136  Potassium 3.5 - 5.1 mmol/L 4.1 4.1 4.2  Chloride 98 - 111 mmol/L 105 104 106  CO2 22 - 32 mmol/L 21(L) 22 21(L)  Calcium 8.9 - 10.3 mg/dL 9.0 9.0 9.1  Total Protein 6.5 - 8.1 g/dL 7.8 7.9 8.0  Total Bilirubin 0.3 - 1.2 mg/dL 1.2 1.3(H) 1.9(H)  Alkaline Phos 38 - 126 U/L 123 92 126  AST 15 - 41 U/L 48(H) 34 46(H)  ALT 0 - 44 U/L _1 PSA 0.05 07/15/2020  PSA 0.06 06/17/2020  PSA 0.15 05/20/2020    DIAGNOSTIC IMAGING:  I have independently reviewed the scans and discussed with the patient. CUP PACEART REMOTE DEVICE CHECK  Result Date: 08/11/2020 Scheduled remote reviewed. Normal device function.  Next remote 91 days. HB    ASSESSMENT:  1. Metastatic castration sensitive prostate cancer topelvic and retroperitoneal lymph nodes: -Prostate cancer diagnosed on 01/18/2016 TRUS biopsy-10/12 cores positive for adenocarcinoma, Gleason 4+3=7, PSA 9.09. -Status post IMRT, 40 sessions completed on 06/14/2016 by Dr. Tammi Klippel. -PSA 0.7 (10/15/2018), 0.8 (02/18/2019), 1.4 (December 2020), 1.4 (June 2021) -Prostate biopsy on 02/10/2020-1 out of 12 cores from left mid lateral region positive for adenocarcinoma. -CTAP on 03/09/2020 with newly enlarged left iliac lymph nodes measuring 1.1 x 0.9 cm. Hepatic steatosis with early signs of cirrhosis. -F-18 PSMAPET scan on 04/12/2020 showed radiotracer activity associated with enlarged left external iliac lymph node 9 mm with SUV 7.6. Small left common iliac lymph node 6 mm, SUV 8.6. Left periaortic lymph node 5 mm, SUV 6.3. Lymph node between IVC and aorta at the level of right renal hilum, SUV 8.6. Lymph node deep to the IVC measuring 6 mm, SUV 5.4. No skeletal metastasis. 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  2. Social/family history: -He is a retired Nature conservation officer. He lives at home with  his wife. He plays golf. He quit smoking in 2015, smoked 1 pack/day for more than 20 years. -Sister has CML. Maternal grandmother had breast cancer. Maternal uncle had lung cancer. 2 maternal cousins had tongue cancer and uterine cancer.  3.  Cirrhosis: -Prior imaging showed cirrhosis of the liver with normal spleen.   PLAN:  1. Metastatic castration sensitive prostate cancer topelvic and retroperitoneal lymph nodes: -He is tolerating apalutamide  240 mg daily very well. -He has some stomach bloating which improved with Gas-X. -Continue Compazine as needed for nausea. -Reviewed labs which showed mild elevated of AST at 48.  Mild thrombocytopenia at 109 is stable. -His PSA continued to improve.  Today PSA 0.04. -Recommend continuing same dose of apalutamide. -Continue monthly Firmagon injections. -  RTC 2 months with follow-up with repeat PSA and other labs.   2. Poorly controlled diabetes: -Blood sugar today is 170. -Continue Glucotrol XL 5 mg daily and Metformin 500 mg twice daily.  Continue follow-up with endocrinology.  3.  Mild thrombocytopenia: -Mild thrombocytopenia since December 2020 has been stable.  No bleeding issues reported.  We will closely monitor.   Orders placed this encounter:  No orders of the defined types were placed in this encounter.    Derek Jack, MD [place] [phone]   I, Milinda Antis, am acting as a scribe for Dr. Sanda Linger.  I, Derek Jack MD, have reviewed the above documentation for accuracy and completeness, and I agree with the above.

## 2020-08-12 NOTE — Progress Notes (Signed)
Peter Dunlap presents today for injection per the provider's orders.  Firmagon administration without incident; injection site WNL; see MAR for injection details.  Patient tolerated procedure well and without incident.  No questions or complaints noted at this time.  Discharged ambulatory in stable condition.

## 2020-08-12 NOTE — Telephone Encounter (Signed)
I can not send back to back, they will not keep on file. He can come get a hard script for next month

## 2020-08-12 NOTE — Progress Notes (Signed)
Patient was assessed by Dr. Delton Coombes and labs have been reviewed.  Patient is okay to proceed with Firmagon injection today. Primary RN and pharmacy aware.

## 2020-08-12 NOTE — Telephone Encounter (Signed)
Patient walkin in needs med refill  oxyCODONE-acetaminophen (PERCOCET) 10-325 MG tablet    [facility] [place], [place] AT [address]

## 2020-08-13 MED ORDER — OXYCODONE-ACETAMINOPHEN 10-325 MG PO TABS: 1.0000 | ORAL_TABLET | Freq: Three times a day (TID) | ORAL | 0 refills | Status: AC | PRN

## 2020-08-19 NOTE — Progress Notes (Signed)
Remote pacemaker transmission.   

## 2020-08-23 ENCOUNTER — Telehealth: Payer: Self-pay

## 2020-08-23 NOTE — Telephone Encounter (Signed)
Per Dr Posey Pronto [facility] can not take this pt as a new pt - called and spoke with neice that had called for original appt.

## 2020-08-25 MED FILL — ERLEADA 60 MG TABS: 60 | 30 days supply | Qty: 120 | Fill #1

## 2020-09-01 ENCOUNTER — Telehealth: Payer: Self-pay

## 2020-09-01 NOTE — Telephone Encounter (Signed)
Per MG we will not take this patient as a new patient at [facility].  I have called and spoke with his wife.

## 2020-09-02 ENCOUNTER — Other Ambulatory Visit (HOSPITAL_COMMUNITY): Payer: Self-pay

## 2020-09-04 ENCOUNTER — Other Ambulatory Visit: Payer: Self-pay | Admitting: Internal Medicine

## 2020-09-07 ENCOUNTER — Other Ambulatory Visit: Payer: Self-pay | Admitting: Family Medicine

## 2020-09-07 ENCOUNTER — Other Ambulatory Visit: Payer: Self-pay | Admitting: Nurse Practitioner

## 2020-09-07 DIAGNOSIS — E890 Postprocedural hypothyroidism: Secondary | ICD-10-CM

## 2020-09-08 ENCOUNTER — Other Ambulatory Visit: Payer: Self-pay | Admitting: Internal Medicine

## 2020-09-08 DIAGNOSIS — E119 Type 2 diabetes mellitus without complications: Secondary | ICD-10-CM | POA: Diagnosis not present

## 2020-09-08 DIAGNOSIS — Z0000 Encounter for general adult medical examination without abnormal findings: Secondary | ICD-10-CM | POA: Diagnosis not present

## 2020-09-08 DIAGNOSIS — M109 Gout, unspecified: Secondary | ICD-10-CM | POA: Diagnosis not present

## 2020-09-08 DIAGNOSIS — E039 Hypothyroidism, unspecified: Secondary | ICD-10-CM | POA: Diagnosis not present

## 2020-09-08 DIAGNOSIS — J302 Other seasonal allergic rhinitis: Secondary | ICD-10-CM | POA: Diagnosis not present

## 2020-09-08 DIAGNOSIS — G894 Chronic pain syndrome: Secondary | ICD-10-CM | POA: Diagnosis not present

## 2020-09-08 DIAGNOSIS — E559 Vitamin D deficiency, unspecified: Secondary | ICD-10-CM | POA: Diagnosis not present

## 2020-09-09 ENCOUNTER — Other Ambulatory Visit: Payer: Self-pay | Admitting: Nurse Practitioner

## 2020-09-09 LAB — COMPLETE METABOLIC PANEL WITH GFR
AG Ratio: 0.9 (calc) — ABNORMAL LOW (ref 1.0–2.5)
ALT: 11 U/L (ref 9–46)
AST: 28 U/L (ref 10–35)
Albumin: 3.7 g/dL (ref 3.6–5.1)
Alkaline phosphatase (APISO): 104 U/L (ref 35–144)
BUN: 13 mg/dL (ref 7–25)
CO2: 23 mmol/L (ref 20–32)
Calcium: 9.6 mg/dL (ref 8.6–10.3)
Chloride: 104 mmol/L (ref 98–110)
Creat: 1.17 mg/dL (ref 0.70–1.25)
GFR, Est African American: 76 mL/min/{1.73_m2} (ref >=60)
GFR, Est Non African American: 65 mL/min/{1.73_m2} (ref >=60)
Globulin: 4 g/dL (calc) — ABNORMAL HIGH (ref 1.9–3.7)
Glucose, Bld: 136 mg/dL — ABNORMAL HIGH (ref 65–99)
Potassium: 4 mmol/L (ref 3.5–5.3)
Sodium: 140 mmol/L (ref 135–146)
Total Bilirubin: 1 mg/dL (ref 0.2–1.2)
Total Protein: 7.7 g/dL (ref 6.1–8.1)

## 2020-09-09 LAB — LIPID PANEL
Cholesterol: 294 mg/dL — ABNORMAL HIGH (ref <200)
HDL: 34 mg/dL — ABNORMAL LOW (ref >=40)
LDL Cholesterol (Calc): 217 mg/dL (calc) — ABNORMAL HIGH
Non-HDL Cholesterol (Calc): 260 mg/dL (calc) — ABNORMAL HIGH (ref <130)
Total CHOL/HDL Ratio: 8.6 (calc) — ABNORMAL HIGH (ref <5.0)
Triglycerides: 233 mg/dL — ABNORMAL HIGH (ref <150)

## 2020-09-09 LAB — VITAMIN D 25 HYDROXY (VIT D DEFICIENCY, FRACTURES): Vit D, 25-Hydroxy: 76 ng/mL (ref 30–100)

## 2020-09-09 LAB — CBC
HCT: 39.5 % (ref 38.5–50.0)
Hemoglobin: 13.5 g/dL (ref 13.2–17.1)
MCH: 34.2 pg — ABNORMAL HIGH (ref 27.0–33.0)
MCHC: 34.2 g/dL (ref 32.0–36.0)
MCV: 100 fL (ref 80.0–100.0)
MPV: 12.2 fL (ref 7.5–12.5)
Platelets: 131 10*3/uL — ABNORMAL LOW (ref 140–400)
RBC: 3.95 10*6/uL — ABNORMAL LOW (ref 4.20–5.80)
RDW: 12.9 % (ref 11.0–15.0)
WBC: 6.1 10*3/uL (ref 3.8–10.8)

## 2020-09-09 LAB — TSH: TSH: 25.77 mIU/L — ABNORMAL HIGH (ref 0.40–4.50)

## 2020-09-09 LAB — URIC ACID: Uric Acid, Serum: 4.6 mg/dL (ref 4.0–8.0)

## 2020-09-09 LAB — T4, FREE: Free T4: 1.1 ng/dL (ref 0.8–1.8)

## 2020-09-10 ENCOUNTER — Inpatient Hospital Stay (HOSPITAL_COMMUNITY): Payer: Medicare Other | Attending: Hematology

## 2020-09-10 ENCOUNTER — Inpatient Hospital Stay (HOSPITAL_COMMUNITY): Payer: Medicare Other

## 2020-09-10 ENCOUNTER — Other Ambulatory Visit: Payer: Self-pay

## 2020-09-10 ENCOUNTER — Encounter (HOSPITAL_COMMUNITY): Payer: Self-pay

## 2020-09-10 VITALS — BP 137/74 | HR 62 | Temp 96.8°F | Resp 18

## 2020-09-10 DIAGNOSIS — Z191 Hormone sensitive malignancy status: Secondary | ICD-10-CM

## 2020-09-10 DIAGNOSIS — C61 Malignant neoplasm of prostate: Secondary | ICD-10-CM | POA: Diagnosis present

## 2020-09-10 DIAGNOSIS — C778 Secondary and unspecified malignant neoplasm of lymph nodes of multiple regions: Secondary | ICD-10-CM | POA: Diagnosis not present

## 2020-09-10 DIAGNOSIS — Z5111 Encounter for antineoplastic chemotherapy: Secondary | ICD-10-CM | POA: Diagnosis not present

## 2020-09-10 LAB — COMPREHENSIVE METABOLIC PANEL
ALT: 14 U/L (ref 0–44)
AST: 31 U/L (ref 15–41)
Albumin: 3.5 g/dL (ref 3.5–5.0)
Alkaline Phosphatase: 104 U/L (ref 38–126)
Anion gap: 11 (ref 5–15)
BUN: 10 mg/dL (ref 8–23)
CO2: 22 mmol/L (ref 22–32)
Calcium: 9 mg/dL (ref 8.9–10.3)
Chloride: 105 mmol/L (ref 98–111)
Creatinine, Ser: 1.02 mg/dL (ref 0.61–1.24)
GFR, Estimated: >60 mL/min (ref >60)
Glucose, Bld: 194 mg/dL — ABNORMAL HIGH (ref 70–99)
Potassium: 3.7 mmol/L (ref 3.5–5.1)
Sodium: 138 mmol/L (ref 135–145)
Total Bilirubin: 1.3 mg/dL — ABNORMAL HIGH (ref 0.3–1.2)
Total Protein: 7.7 g/dL (ref 6.5–8.1)

## 2020-09-10 LAB — CBC WITH DIFFERENTIAL/PLATELET
Abs Immature Granulocytes: 0.01 10*3/uL (ref 0.00–0.07)
Basophils Absolute: 0.1 10*3/uL (ref 0.0–0.1)
Basophils Relative: 1 %
Eosinophils Absolute: 0.1 10*3/uL (ref 0.0–0.5)
Eosinophils Relative: 2 %
HCT: 38.3 % — ABNORMAL LOW (ref 39.0–52.0)
Hemoglobin: 13.3 g/dL (ref 13.0–17.0)
Immature Granulocytes: 0 %
Lymphocytes Relative: 26 %
Lymphs Abs: 1.6 10*3/uL (ref 0.7–4.0)
MCH: 35.8 pg — ABNORMAL HIGH (ref 26.0–34.0)
MCHC: 34.7 g/dL (ref 30.0–36.0)
MCV: 103 fL — ABNORMAL HIGH (ref 80.0–100.0)
Monocytes Absolute: 0.5 10*3/uL (ref 0.1–1.0)
Monocytes Relative: 9 %
Neutro Abs: 3.7 10*3/uL (ref 1.7–7.7)
Neutrophils Relative %: 62 %
Platelets: 112 10*3/uL — ABNORMAL LOW (ref 150–400)
RBC: 3.72 MIL/uL — ABNORMAL LOW (ref 4.22–5.81)
RDW: 13.8 % (ref 11.5–15.5)
WBC: 6 10*3/uL (ref 4.0–10.5)
nRBC: 0 % (ref 0.0–0.2)

## 2020-09-10 MED ORDER — DEGARELIX ACETATE 80 MG ~~LOC~~ SOLR
80.0000 mg | Freq: Once | SUBCUTANEOUS | Status: AC
  Administered 2020-09-10: 80 mg via SUBCUTANEOUS
  Filled 2020-09-10: qty 4

## 2020-09-10 NOTE — Patient Instructions (Signed)
[place] at [facility]  Discharge Instructions:  You received your Firmagon injection today, returned as scheduled. _______________________________________________________________  Thank you for choosing [place] at [facility] to provide your oncology and hematology care.  To afford each patient quality time with our providers, please arrive at least 15 minutes before your scheduled appointment.  You need to re-schedule your appointment if you arrive 10 or more minutes late.  We strive to give you quality time with our providers, and arriving late affects you and other patients whose appointments are after yours.  Also, if you no show three or more times for appointments you may be dismissed from the clinic.  Again, thank you for choosing [place] at [place] hope is that these requests will allow you access to exceptional care and in a timely manner. _______________________________________________________________  If you have questions after your visit, please contact our office at [phone] between the hours of 8:30 a.m. and 5:00 p.m. Voicemails left after 4:30 p.m. will not be returned until the following business day. _______________________________________________________________  For prescription refill requests, have your pharmacy contact our office. _______________________________________________________________  Recommendations made by the consultant and any test results will be sent to your referring physician. _______________________________________________________________

## 2020-09-10 NOTE — Progress Notes (Signed)
Patient tolerated Firmagon injection with no complaints voiced. Site clean and dry with no bruising or swelling noted at site. See MAR for details. Band aid applied.  Patient stable during and after injection. VSS with discharge and left in satisfactory condition with no s/s of distress noted. 

## 2020-09-14 DIAGNOSIS — H524 Presbyopia: Secondary | ICD-10-CM | POA: Diagnosis not present

## 2020-09-16 ENCOUNTER — Other Ambulatory Visit: Payer: Self-pay | Admitting: Family Medicine

## 2020-09-16 DIAGNOSIS — E1169 Type 2 diabetes mellitus with other specified complication: Secondary | ICD-10-CM

## 2020-09-22 ENCOUNTER — Other Ambulatory Visit (HOSPITAL_COMMUNITY): Payer: Self-pay

## 2020-09-23 ENCOUNTER — Other Ambulatory Visit (HOSPITAL_COMMUNITY): Payer: Self-pay

## 2020-09-23 MED FILL — Apalutamide Tab 60 MG: ORAL | 30 days supply | Qty: 120 | Fill #0 | Status: AC

## 2020-09-25 DIAGNOSIS — M109 Gout, unspecified: Secondary | ICD-10-CM | POA: Diagnosis not present

## 2020-09-25 DIAGNOSIS — E039 Hypothyroidism, unspecified: Secondary | ICD-10-CM | POA: Diagnosis not present

## 2020-09-25 DIAGNOSIS — I509 Heart failure, unspecified: Secondary | ICD-10-CM | POA: Diagnosis not present

## 2020-09-25 DIAGNOSIS — E119 Type 2 diabetes mellitus without complications: Secondary | ICD-10-CM | POA: Diagnosis not present

## 2020-09-25 DIAGNOSIS — Z1159 Encounter for screening for other viral diseases: Secondary | ICD-10-CM | POA: Diagnosis not present

## 2020-09-25 DIAGNOSIS — C7951 Secondary malignant neoplasm of bone: Secondary | ICD-10-CM | POA: Diagnosis not present

## 2020-09-25 DIAGNOSIS — Z79899 Other long term (current) drug therapy: Secondary | ICD-10-CM | POA: Diagnosis not present

## 2020-10-07 ENCOUNTER — Inpatient Hospital Stay (HOSPITAL_COMMUNITY): Payer: Medicare Other

## 2020-10-07 ENCOUNTER — Inpatient Hospital Stay (HOSPITAL_COMMUNITY): Payer: Medicare Other | Attending: Hematology

## 2020-10-07 ENCOUNTER — Encounter (HOSPITAL_COMMUNITY): Payer: Self-pay | Admitting: Hematology

## 2020-10-07 ENCOUNTER — Other Ambulatory Visit: Payer: Self-pay | Admitting: Family Medicine

## 2020-10-07 ENCOUNTER — Other Ambulatory Visit: Payer: Self-pay

## 2020-10-07 ENCOUNTER — Inpatient Hospital Stay (HOSPITAL_BASED_OUTPATIENT_CLINIC_OR_DEPARTMENT_OTHER): Payer: Medicare Other | Admitting: Hematology

## 2020-10-07 VITALS — BP 122/74 | HR 79 | Temp 97.6°F | Resp 18 | Wt 311.1 lb

## 2020-10-07 DIAGNOSIS — Z808 Family history of malignant neoplasm of other organs or systems: Secondary | ICD-10-CM | POA: Diagnosis not present

## 2020-10-07 DIAGNOSIS — Z191 Hormone sensitive malignancy status: Secondary | ICD-10-CM

## 2020-10-07 DIAGNOSIS — E119 Type 2 diabetes mellitus without complications: Secondary | ICD-10-CM | POA: Diagnosis not present

## 2020-10-07 DIAGNOSIS — Z806 Family history of leukemia: Secondary | ICD-10-CM | POA: Insufficient documentation

## 2020-10-07 DIAGNOSIS — E039 Hypothyroidism, unspecified: Secondary | ICD-10-CM | POA: Insufficient documentation

## 2020-10-07 DIAGNOSIS — C778 Secondary and unspecified malignant neoplasm of lymph nodes of multiple regions: Secondary | ICD-10-CM | POA: Insufficient documentation

## 2020-10-07 DIAGNOSIS — D696 Thrombocytopenia, unspecified: Secondary | ICD-10-CM

## 2020-10-07 DIAGNOSIS — C61 Malignant neoplasm of prostate: Secondary | ICD-10-CM | POA: Diagnosis present

## 2020-10-07 DIAGNOSIS — Z800 Family history of malignant neoplasm of digestive organs: Secondary | ICD-10-CM | POA: Insufficient documentation

## 2020-10-07 DIAGNOSIS — R5383 Other fatigue: Secondary | ICD-10-CM | POA: Insufficient documentation

## 2020-10-07 DIAGNOSIS — I10 Essential (primary) hypertension: Secondary | ICD-10-CM | POA: Insufficient documentation

## 2020-10-07 DIAGNOSIS — Z87891 Personal history of nicotine dependence: Secondary | ICD-10-CM | POA: Insufficient documentation

## 2020-10-07 DIAGNOSIS — R140 Abdominal distension (gaseous): Secondary | ICD-10-CM | POA: Diagnosis not present

## 2020-10-07 DIAGNOSIS — Z833 Family history of diabetes mellitus: Secondary | ICD-10-CM | POA: Diagnosis not present

## 2020-10-07 DIAGNOSIS — Z79899 Other long term (current) drug therapy: Secondary | ICD-10-CM | POA: Diagnosis not present

## 2020-10-07 DIAGNOSIS — E669 Obesity, unspecified: Secondary | ICD-10-CM | POA: Insufficient documentation

## 2020-10-07 DIAGNOSIS — Z8041 Family history of malignant neoplasm of ovary: Secondary | ICD-10-CM | POA: Insufficient documentation

## 2020-10-07 DIAGNOSIS — E785 Hyperlipidemia, unspecified: Secondary | ICD-10-CM | POA: Diagnosis not present

## 2020-10-07 DIAGNOSIS — Z8249 Family history of ischemic heart disease and other diseases of the circulatory system: Secondary | ICD-10-CM | POA: Diagnosis not present

## 2020-10-07 DIAGNOSIS — Z803 Family history of malignant neoplasm of breast: Secondary | ICD-10-CM | POA: Diagnosis not present

## 2020-10-07 DIAGNOSIS — K7460 Unspecified cirrhosis of liver: Secondary | ICD-10-CM | POA: Insufficient documentation

## 2020-10-07 DIAGNOSIS — Z801 Family history of malignant neoplasm of trachea, bronchus and lung: Secondary | ICD-10-CM | POA: Diagnosis not present

## 2020-10-07 DIAGNOSIS — Z5111 Encounter for antineoplastic chemotherapy: Secondary | ICD-10-CM | POA: Insufficient documentation

## 2020-10-07 LAB — PSA: Prostatic Specific Antigen: 0.01 ng/mL (ref 0.00–4.00)

## 2020-10-07 LAB — CBC WITH DIFFERENTIAL/PLATELET
Abs Immature Granulocytes: 0.01 10*3/uL (ref 0.00–0.07)
Basophils Absolute: 0.1 10*3/uL (ref 0.0–0.1)
Basophils Relative: 2 %
Eosinophils Absolute: 0.1 10*3/uL (ref 0.0–0.5)
Eosinophils Relative: 2 %
HCT: 39.5 % (ref 39.0–52.0)
Hemoglobin: 13.5 g/dL (ref 13.0–17.0)
Immature Granulocytes: 0 %
Lymphocytes Relative: 39 %
Lymphs Abs: 1.8 10*3/uL (ref 0.7–4.0)
MCH: 35.4 pg — ABNORMAL HIGH (ref 26.0–34.0)
MCHC: 34.2 g/dL (ref 30.0–36.0)
MCV: 103.7 fL — ABNORMAL HIGH (ref 80.0–100.0)
Monocytes Absolute: 0.5 10*3/uL (ref 0.1–1.0)
Monocytes Relative: 10 %
Neutro Abs: 2.2 10*3/uL (ref 1.7–7.7)
Neutrophils Relative %: 47 %
Platelets: 117 10*3/uL — ABNORMAL LOW (ref 150–400)
RBC: 3.81 MIL/uL — ABNORMAL LOW (ref 4.22–5.81)
RDW: 13.4 % (ref 11.5–15.5)
WBC: 4.7 10*3/uL (ref 4.0–10.5)
nRBC: 0 % (ref 0.0–0.2)

## 2020-10-07 LAB — COMPREHENSIVE METABOLIC PANEL
ALT: 13 U/L (ref 0–44)
AST: 28 U/L (ref 15–41)
Albumin: 3.6 g/dL (ref 3.5–5.0)
Alkaline Phosphatase: 82 U/L (ref 38–126)
Anion gap: 10 (ref 5–15)
BUN: 11 mg/dL (ref 8–23)
CO2: 21 mmol/L — ABNORMAL LOW (ref 22–32)
Calcium: 9.2 mg/dL (ref 8.9–10.3)
Chloride: 104 mmol/L (ref 98–111)
Creatinine, Ser: 1.09 mg/dL (ref 0.61–1.24)
GFR, Estimated: >60 mL/min (ref >60)
Glucose, Bld: 208 mg/dL — ABNORMAL HIGH (ref 70–99)
Potassium: 3.9 mmol/L (ref 3.5–5.1)
Sodium: 135 mmol/L (ref 135–145)
Total Bilirubin: 1.2 mg/dL (ref 0.3–1.2)
Total Protein: 7.7 g/dL (ref 6.5–8.1)

## 2020-10-07 MED ORDER — DEGARELIX ACETATE 80 MG ~~LOC~~ SOLR
80.0000 mg | Freq: Once | SUBCUTANEOUS | Status: AC
  Administered 2020-10-07: 80 mg via SUBCUTANEOUS
  Filled 2020-10-07: qty 4

## 2020-10-07 NOTE — Progress Notes (Signed)
[place], [place] [address]   CLINIC:  Medical Oncology/Hematology  PCP:  No primary care provider on file. No primary physician on file. 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 A variant of uncertain significance (VUS) was detected in the NF1 gene called c.1178A>G. The report date is 05/23/2020.  The Common Hereditary Cancers Panel offered by [facility] includes sequencing and/or deletion duplication testing of the following 47 genes: APC, ATM, AXIN2, BARD1, BMPR1A, BRCA1, BRCA2, BRIP1, CDH1, CDK4, CDKN2A (p14ARF), CDKN2A (p16INK4a), CHEK2, CTNNA1, DICER1, EPCAM (Deletion/duplication testing only), GREM1 (promoter region deletion/duplication testing only), KIT, MEN1, MLH1, MSH2, MSH3, MSH6, MUTYH, NBN, NF1, NTHL1, PALB2, PDGFRA, PMS2, POLD1, POLE, PTEN, RAD50, RAD51C, RAD51D, SDHB, SDHC, SDHD, SMAD4, SMARCA4. STK11, TP53, TSC1, TSC2, and VHL.  The following genes were evaluated for sequence changes only: SDHA and HOXB13 c.251G>A variant only. The Prostate Cancer HRR Panel offered by [facility] includes sequencing and/or deletion duplication analysis of the following 10 genes: ATM, BARD1, BRCA1, BRCA2, BRIP1, CHEK2, FANCL, PALB2, RAD51C, RAD51D.     CANCER STAGING: Cancer Staging No matching staging information was found for the patient.  INTERVAL HISTORY:  [name], a 65 y.o. male, returns for routine follow-up of his metastatic castration sensitive prostate  cancer. Peter Dunlap was last seen on 08/12/2020.   Today he reports feeling well. He is taking Erleada 240 mg daily and denies having N/V/D, though he reports that he again developed abdominal distention and he continues taking Gas-X. He reports having flatus but denies burping; he denies having abdominal cramping or hot flashes. His energy levels are decreased and needs to drink coffee in the AM to get going; it takes him an entire day to mow his yard. He continues taking calcium and vitamin D. He denies having any new aches or pains. His appetite is excellent.   REVIEW OF SYSTEMS:  Review of Systems  Constitutional: Positive for fatigue (25%). Negative for appetite change.  Gastrointestinal: Positive for abdominal distention. Negative for abdominal pain, diarrhea, nausea and vomiting.  Endocrine: Negative for hot flashes.  Musculoskeletal: Negative for arthralgias and myalgias.  All other systems reviewed and are negative.   PAST MEDICAL/SURGICAL HISTORY:  Past Medical History:  Diagnosis Date  . Arteriosclerotic cardiovascular disease (ASCVD) 2005   catheterization in 10/2010:50% mid LAD, diffuse distal disease, circumflex irregularities, large dominant RCA with a 50% ostial, 70% distal, 60% posterolateral and 70% PDA; normal EF  . Arthritis   . Benign prostatic hypertrophy   . Bilateral carpal tunnel syndrome 07/03/2018  . Cerebrovascular disease 2010   R. carotid endarterectomy; Duplex in 10/2010-widely patent ICAs, subtotal left vertebral-not thought to be contributing to symptoms  . Cervical spine disease    CT in 2012-advanced degeneration and spondylosis with moderate spinal stenosis at C3-C6  . CHF (congestive heart failure) ([place])   . Depression   . Diabetes mellitus without complication ([place])   . Erectile dysfunction   . Family history of breast cancer   . Family history of cancer of  mouth   . Family history of CML (chronic monocytic leukemia)   . Family history of lung cancer    . Family history of ovarian cancer   . Family history of stomach cancer   . Gastroesophageal reflux disease   . H/O hiatal hernia   . H/O: substance abuse ([place])    Cocaine, marijuana, alcohol.  Quit 2013.   [name] Hyperlipidemia   . Hypertension   . Non-ST elevation myocardial infarction (NSTEMI), initial episode of care [facility]) 12/02/2013   DES LAD  . Obesity   . Prostate cancer ([place])   . Sleep apnea    CPAP  . Tachy-brady syndrome ([place])    a. s/p STJ dual chamber PPM   . Thyroid disease   . Tobacco abuse    Quit 2014  . 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 BRAIN SURGERY  2015   hematoma evacuation  . BURR HOLE Right 04/13/2014   Procedure: Haskell Flirt;  Surgeon: Charlie Pitter, MD;  Location: [place] NEURO ORS;  Service: Neurosurgery;  Laterality: Right;  . CAROTID ENDARTERECTOMY Right Feb. 25, 2010    CEA  . CORONARY ANGIOPLASTY WITH STENT PLACEMENT  12/03/2013   LAD 90%-->0% W/ Promus Premier DES 3.0 mm x 16 mm, CFX OK, RCA 40%, EF 70-75%  . LEFT ATRIAL APPENDAGE OCCLUSION N/A 08/05/2015   Procedure: LEFT ATRIAL APPENDAGE OCCLUSION;  Surgeon: [name], MD;  Location: [place] CV LAB;  Service: Cardiovascular;  Laterality: N/A;  . LEFT HEART CATHETERIZATION WITH CORONARY ANGIOGRAM Left 12/03/2013   Procedure: LEFT HEART CATHETERIZATION WITH CORONARY ANGIOGRAM;  Surgeon: Leonie Man, MD;  Location: [facility] CATH LAB;  Service: Cardiovascular;  Laterality: Left;  . LEFT HEART CATHETERIZATION WITH CORONARY ANGIOGRAM N/A 01/26/2014   Procedure: LEFT HEART CATHETERIZATION WITH CORONARY ANGIOGRAM;  Surgeon: Jettie Booze, MD;  Location[facility] C[facility] LAB;  Service: Cardiovascular;  Laterality: N/A;  . LEFT HEART CATHETERIZATION WITH CORONARY ANGIOGRAM N/A 08/03/2014   Procedure: LEFT HEART CATHETERIZATION WITH CORONARY ANGIOGRAM;  Surgeon: Burnell Blanks, MD;  Location: [facility] CATH LAB;  Service: Cardiovascular;  Laterality:  N/A;  . PERCUTANEOUS CORONARY STENT INTERVENTION (PCI-S)  12/03/2013   Procedure: PERCUTANEOUS CORONARY STENT INTERVENTION (PCI-S);  Surgeon: Leonie Man, MD;  Location: [facility] CATH LAB;  Service: Cardiovascular;;  . PERMANENT PACEMAKER INSERTION N/A 09/18/2014   Procedure: PERMANENT PACEMAKER INSERTION;  Surgeon: [name], MD;  Location: [facility] CATH LAB;  Service: Cardiovascular;  Laterality: N/A;  . RADIOFREQUENCY ABLATION  2005   for PSVT  . TEE WITHOUT CARDIOVERSION N/A 07/27/2015   Procedure: TRANSESOPHAGEAL ECHOCARDIOGRAM (TEE);  Surgeon: Lelon Perla, MD;  Location: [facility] ENDOSCOPY;  Service: Cardiovascular;  Laterality: N/A;  . TEE WITHOUT CARDIOVERSION N/A 09/15/2015   Procedure: TRANSESOPHAGEAL ECHOCARDIOGRAM (TEE);  Surgeon: Thayer Headings, MD;  Location: [facility] ENDOSCOPY;  Service: Cardiovascular;  Laterality: N/A;    SOCIAL HISTORY:  Social History   Socioeconomic History  . Marital status: Married    Spouse name: Not on file  . Number of children: 0  . Years of education: Not on file  . Highest education level: Not on file  Occupational History  . Occupation: Retired  Tobacco Use  . Smoking status: Former Smoker    Packs/day: 1.00    Years: 40.00    Pack years: 40.00    Types: Cigarettes    Start date: 10/20/1972    Quit date: 10/10/2012  Years since quitting: 7.9  . Smokeless tobacco: Never Used  . Tobacco comment: Quit in May.   Vaping Use  . Vaping Use: Never used  Substance and Sexual Activity  . Alcohol use: Yes    Alcohol/week: 0.0 standard drinks    Comment: former drinker-- sober since 2013.   . Drug use: No    Types: Cocaine    Comment: quit cocaine 10/2011  . Sexual activity: Yes    Partners: Female  Other Topics Concern  . Not on file  Social History Narrative   Lives in [place].   Social Determinants of Health   Financial Resource Strain: Low Risk   . Difficulty of Paying Living Expenses: Not hard at all  Food Insecurity: No Food  Insecurity  . Worried About Charity fundraiser in the Last Year: Never true  . Ran Out of Food in the Last Year: Never true  Transportation Needs: No Transportation Needs  . Lack of Transportation (Medical): No  . Lack of Transportation (Non-Medical): No  Physical Activity: Inactive  . Days of Exercise per Week: 0 days  . Minutes of Exercise per Session: 0 min  Stress: No Stress Concern Present  . Feeling of Stress : Not at all  Social Connections: Moderately Isolated  . Frequency of Communication with Friends and Family: More than three times a week  . Frequency of Social Gatherings with Friends and Family: Once a week  . Attends Religious Services: Never  . Active Member of Clubs or Organizations: No  . Attends Archivist Meetings: Never  . Marital Status: Married  Human resources officer Violence: Not At Risk  . Fear of Current or Ex-Partner: No  . Emotionally Abused: No  . Physically Abused: No  . Sexually Abused: No    FAMILY HISTORY:  Family History  Problem Relation Age of Onset  . Hypertension Mother        Cerebrovascular disease  . Diabetes Mother   . Coronary artery disease Father 73  . Diabetes type II Father   . Hypertension Father   . Heart attack Father   . Diabetes Brother   . Hypertension Brother   . Diabetes Sister   . Hypertension Sister   . Heart attack Sister 90  . Leukemia Sister 35       CML  . Breast cancer Maternal Grandmother        dx 95s  . Lung cancer Maternal Uncle        dx >50, smoker  . Cancer Cousin        mouth cancer, dx 25s, no smoking/chew tobacco hx (maternal 1st cousin)  . Ovarian cancer Cousin        dx <50 (maternal 1st cousin)  . Stomach cancer Cousin        dx 30s (maternal 1st cousin)  . Cancer Cousin        type unknown to pt, dx >50 (paternal 1st cousin)    CURRENT MEDICATIONS:  Current Outpatient Medications  Medication Sig Dispense Refill  . allopurinol (ZYLOPRIM) 100 MG tablet TAKE 1 TABLET(100 MG) BY  MOUTH DAILY 90 tablet 0  . apalutamide (ERLEADA) 60 MG tablet TAKE 4 TABLETS (240 MG TOTAL) BY MOUTH DAILY. MAY BE TAKEN WITH OR WITHOUT FOOD. SWALLOW TABLETS WHOLE. 120 tablet 3  . aspirin EC 325 MG tablet Take 1 tablet (325 mg total) daily by mouth. 30 tablet 0  . Blood Glucose Monitoring Suppl (BLOOD GLUCOSE SYSTEM PAK) KIT Use as  directed to monitor FSBS 1x daily. Dx: E11.9. 1 kit 1  . colchicine 0.6 MG tablet     . Cyanocobalamin (VITAMIN B 12) 500 MCG TABS Take by mouth.    . DEXILANT 60 MG capsule TAKE 1 CAPSULE(60 MG) BY MOUTH DAILY 30 capsule 5  . fluticasone (FLONASE) 50 MCG/ACT nasal spray Place 2 sprays into both nostrils daily. 16 g 6  . furosemide (LASIX) 20 MG tablet TAKE 3 TABLETS BY MOUTH DAILY 270 tablet 3  . glipiZIDE (GLUCOTROL XL) 5 MG 24 hr tablet TAKE 1 TABLET(5 MG) BY MOUTH DAILY WITH BREAKFAST 90 tablet 0  . isosorbide mononitrate (IMDUR) 60 MG 24 hr tablet TAKE 1 TABLET BY MOUTH DAILY 90 tablet 2  . levothyroxine (SYNTHROID) 200 MCG tablet TAKE 1 TABLET(200 MCG) BY MOUTH DAILY BEFORE AND BREAKFAST 90 tablet 0  . loratadine (CLARITIN) 10 MG tablet TAKE 1 TABLET(10 MG) BY MOUTH DAILY AS NEEDED FOR ALLERGIES 30 tablet 2  . Magnesium Oxide 400 (240 Mg) MG TABS TAKE 1 TABLET(400 MG) BY MOUTH TWICE DAILY 180 tablet 1  . metFORMIN (GLUCOPHAGE) 500 MG tablet TAKE 1 TABLET BY MOUTH TWICE DAILY 180 tablet 0  . nitroGLYCERIN (NITROSTAT) 0.4 MG SL tablet PLACE 1 TABLET UNDER THE TONGUE EVERY 5 MINUTES AS NEEDED FOR CHEST PAIN. CALL 911 AT THIRD DOSE IN 15 MINUTES 25 tablet 1  . ONETOUCH ULTRA test strip USE TO TEST ONCE DAILY 100 strip 1  . oxyCODONE-acetaminophen (PERCOCET) 10-325 MG tablet Take 1 tablet by mouth every 8 (eight) hours as needed for pain. 90 tablet 0  . pantoprazole (PROTONIX) 40 MG tablet TAKE 1 TABLET(40 MG) BY MOUTH DAILY 30 tablet 3  . silodosin (RAPAFLO) 8 MG CAPS capsule Take 1 capsule (8 mg total) by mouth 2 (two) times daily. 60 capsule 11  . SIMVASTATIN PO      . sotalol (BETAPACE) 80 MG tablet Take 2 tablets (160 mg) in the AM and 1 tablet (80 mg) in the PM 135 tablet 3  . Vitamin D, Ergocalciferol, (DRISDOL) 1.25 MG (50000 UNIT) CAPS capsule TAKE 1 CAPSULE BY MOUTH EVERY 7 DAYS 12 capsule 0  . simvastatin (ZOCOR) 40 MG tablet TAKE 1 TABLET(40 MG) BY MOUTH DAILY (Patient not taking: Reported on 10/07/2020) 90 tablet 1   No current facility-administered medications for this visit.    ALLERGIES:  Allergies  Allergen Reactions  . Trazodone And Nefazodone     Nightmares  . Lactose Intolerance (Gi) Other (See Comments)    UPSET STOMACH     PHYSICAL EXAM:  Performance status (ECOG): 1 - Symptomatic but completely ambulatory  Vitals:   10/07/20 1044  BP: 122/74  Pulse: 79  Resp: 18  Temp: 97.6 F (36.4 C)  SpO2: 99%   Wt Readings from Last 3 Encounters:  10/07/20 (!) 311 lb 1.1 oz (141.1 kg)  08/12/20 (!) 318 lb 1.6 oz (144.3 kg)  08/04/20 (!) 315 lb 12.8 oz (143.2 kg)   Physical Exam Vitals reviewed.  Constitutional:      Appearance: Normal appearance. He is obese.  Cardiovascular:     Rate and Rhythm: Normal rate and regular rhythm.     Pulses: Normal pulses.     Heart sounds: Normal heart sounds.  Pulmonary:     Effort: Pulmonary effort is normal.     Breath sounds: Normal breath sounds.  Neurological:     General: No focal deficit present.     Mental Status: He is alert and oriented  to person, place, and time.  Psychiatric:        Mood and Affect: Mood normal.        Behavior: Behavior normal.      LABORATORY DATA:  I have reviewed the labs as listed.  CBC Latest Ref Rng & Units 10/07/2020 09/10/2020 09/08/2020  WBC 4.0 - 10.5 K/uL 4.7 6.0 6.1  Hemoglobin 13.0 - 17.0 g/dL 13.5 13.3 13.5  Hematocrit 39.0 - 52.0 % 39.5 38.3(L) 39.5  Platelets 150 - 400 K/uL 117(L) 112(L) 131(L)   CMP Latest Ref Rng & Units 10/07/2020 09/10/2020 09/08/2020  Glucose 70 - 99 mg/dL 208(H) 194(H) 136(H)  BUN 8 - 23 mg/dL $Remove'11 10 13  'FjuhyCR$ Creatinine  0.61 - 1.24 mg/dL 1.09 1.02 1.17  Sodium 135 - 145 mmol/L 135 138 140  Potassium 3.5 - 5.1 mmol/L 3.9 3.7 4.0  Chloride 98 - 111 mmol/L 104 105 104  CO2 22 - 32 mmol/L 21(L) 22 23  Calcium 8.9 - 10.3 mg/dL 9.2 9.0 9.6  Total Protein 6.5 - 8.1 g/dL 7.7 7.7 7.7  Total Bilirubin 0.3 - 1.2 mg/dL 1.2 1.3(H) 1.0  Alkaline Phos 38 - 126 U/L 82 104 -  AST 15 - 41 U/L $Remo'28 31 28  'jclzf$ ALT 0 - 44 U/L $Remo'13 14 11    'EPKIu$ DIAGNOSTIC IMAGING:  I have independently reviewed the scans and discussed with the patient. No results found.   ASSESSMENT:  1. Metastatic castration sensitive prostate cancer topelvic and retroperitoneal lymph nodes: -Prostate cancer diagnosed on 01/18/2016 TRUS biopsy-10/12 cores positive for adenocarcinoma, Gleason 4+3=7, PSA 9.09. -Status post IMRT, 40 sessions completed on 06/14/2016 by Dr. Tammi Klippel. -PSA 0.7 (10/15/2018), 0.8 (02/18/2019), 1.4 (December 2020), 1.4 (June 2021) -Prostate biopsy on 02/10/2020-1 out of 12 cores from left mid lateral region positive for adenocarcinoma. -CTAP on 03/09/2020 with newly enlarged left iliac lymph nodes measuring 1.1 x 0.9 cm. Hepatic steatosis with early signs of cirrhosis. -F-18 PSMAPET scan on 04/12/2020 showed radiotracer activity associated with enlarged left external iliac lymph node 9 mm with SUV 7.6. Small left common iliac lymph node 6 mm, SUV 8.6. Left periaortic lymph node 5 mm, SUV 6.3. Lymph node between IVC and aorta at the level of right renal hilum, SUV 8.6. Lymph node deep to the IVC measuring 6 mm, SUV 5.4. No skeletal metastasis. 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  2. Social/family history: -He is a retired Nature conservation officer. He lives at home with his wife. He plays golf. He quit smoking in 2015, smoked 1 pack/day for more than 20 years. -Sister has CML. Maternal grandmother had breast cancer. Maternal uncle had lung cancer. 2 maternal cousins had tongue cancer and uterine cancer.  3.  Cirrhosis: -Prior  imaging showed cirrhosis of the liver with normal spleen.   PLAN:   1. Metastatic castration sensitive prostate cancer topelvic and retroperitoneal lymph nodes: -Continue apalutamide to 40 mg daily.  Denies any major hot flashes.  Reports some fatigue.  Has some flatulence since start of apalutamide which improved with Gas-X. - Reviewed labs today which showed normal LFTs.  PSA continues to improve at 0.01. - Continue monthly degarelix. - RTC 2 months for follow-up with repeat PSA and testosterone.  2. Poorly controlled diabetes: -Blood sugar today is 208. - Continue Glucotrol XL 5 mg daily and metformin 100 mg twice daily.  Continue to follow-up with endocrinology.  3.  Mild thrombocytopenia: -Mild thrombocytopenia since December 2020 has been stable.  Today platelet count is 117.  No  bleeding issues reported.  4.  Hypothyroidism: - TSH on 09/08/2020 was 25.7. - Continue Synthroid 200 mcg daily.   Orders placed this encounter:  Orders Placed This Encounter  Procedures  . CBC with Differential/Platelet  . Comprehensive metabolic panel  . PSA  . Testosterone     Derek Jack, MD [place] [phone]   I, Milinda Antis, am acting as a scribe for Dr. Sanda Linger.  I, Derek Jack MD, have reviewed the above documentation for accuracy and completeness, and I agree with the above.

## 2020-10-07 NOTE — Patient Instructions (Signed)
[place] at [facility] Discharge Instructions  You were seen today by Dr. Delton Coombes. He went over your recent results. You received your Mills Koller injection today; continue getting it every month. Dr. Delton Coombes will see you back in 2 months for labs and follow up.   Thank you for choosing [place] at [facility] to provide your oncology and hematology care.  To afford each patient quality time with our provider, please arrive at least 15 minutes before your scheduled appointment time.   If you have a lab appointment with the [facility] please come in thru the Main Entrance and check in at the main information desk  You need to re-schedule your appointment should you arrive 10 or more minutes late.  We strive to give you quality time with our providers, and arriving late affects you and other patients whose appointments are after yours.  Also, if you no show three or more times for appointments you may be dismissed from the clinic at the providers discretion.     Again, thank you for choosing [facility].  Our hope is that these requests will decrease the amount of time that you wait before being seen by our physicians.       _____________________________________________________________  Should you have questions after your visit to [facility], please contact our office at [phone] between the hours of 8:00 a.m. and 4:30 p.m.  Voicemails left after 4:00 p.m. will not be returned until the following business day.  For prescription refill requests, have your pharmacy contact our office and allow 72 hours.    Cancer Center Support Programs:   > Cancer Support Group  2nd Tuesday of the month 1pm-2pm, Journey Room

## 2020-10-07 NOTE — Progress Notes (Signed)
Peter Dunlap presents today for injection per MD orders. Firmagon 80 mg  administered SQ in right Abdomen. Administration without incident. Patient tolerated well. No complaints at this time. Discharged from clinic ambulatory in stable condition. Alert and oriented x 3. F/U with [facility] as scheduled.

## 2020-10-07 NOTE — Patient Instructions (Signed)
[place]  Discharge Instructions: Thank you for choosing [facility] to provide your oncology and hematology care.  If you have a lab appointment with the [facility], please come in thru the Main Entrance and check in at the main information desk.  Wear comfortable clothing and clothing appropriate for easy access to any Portacath or PICC line.   We strive to give you quality time with your provider. You may need to reschedule your appointment if you arrive late (15 or more minutes).  Arriving late affects you and other patients whose appointments are after yours.  Also, if you miss three or more appointments without notifying the office, you may be dismissed from the clinic at the provider's discretion.      For prescription refill requests, have your pharmacy contact our office and allow 72 hours for refills to be completed.    Today you received the following Firmagon 80 mg injection.    To help prevent nausea and vomiting after your treatment, we encourage you to take your nausea medication as directed.  BELOW ARE SYMPTOMS THAT SHOULD BE REPORTED IMMEDIATELY: . *FEVER GREATER THAN 100.4 F (38 C) OR HIGHER . *CHILLS OR SWEATING . *NAUSEA AND VOMITING THAT IS NOT CONTROLLED WITH YOUR NAUSEA MEDICATION . *UNUSUAL SHORTNESS OF BREATH . *UNUSUAL BRUISING OR BLEEDING . *URINARY PROBLEMS (pain or burning when urinating, or frequent urination) . *BOWEL PROBLEMS (unusual diarrhea, constipation, pain near the anus) . TENDERNESS IN MOUTH AND THROAT WITH OR WITHOUT PRESENCE OF ULCERS (sore throat, sores in mouth, or a toothache) . UNUSUAL RASH, SWELLING OR PAIN  . UNUSUAL VAGINAL DISCHARGE OR ITCHING   Items with * indicate a potential emergency and should be followed up as soon as possible or go to the Emergency Department if any problems should occur.  Please show the CHEMOTHERAPY ALERT CARD or IMMUNOTHERAPY ALERT CARD at check-in to the Emergency Department  and triage nurse.  Should you have questions after your visit or need to cancel or reschedule your appointment, please contact [facility] [phone]  and follow the prompts.  Office hours are 8:00 a.m. to 4:30 p.m. Monday - Friday. Please note that voicemails left after 4:00 p.m. may not be returned until the following business day.  We are closed weekends and major holidays. You have access to a nurse at all times for urgent questions. Please call the main number to the clinic [phone] and follow the prompts.  For any non-urgent questions, you may also contact your provider using MyChart. We now offer e-Visits for anyone 65 and older to request care online for non-urgent symptoms. For details visit [email or web address].   Also download the MyChart app! Go to the app store, search "MyChart", open the app, select [facility], and log in with your MyChart username and password.  Due to Covid, a mask is required upon entering the hospital/clinic. If you do not have a mask, one will be given to you upon arrival. For doctor visits, patients may have 1 support person aged 65 or older with them. For treatment visits, patients cannot have anyone with them due to current Covid guidelines and our immunocompromised population.

## 2020-10-07 NOTE — Progress Notes (Signed)
Patient assessed by Dr Delton Coombes and is ok for injection today.  No distress or complaints noted.

## 2020-10-15 ENCOUNTER — Other Ambulatory Visit (HOSPITAL_COMMUNITY): Payer: Self-pay

## 2020-10-15 MED FILL — Apalutamide Tab 60 MG: ORAL | 30 days supply | Qty: 120 | Fill #1 | Status: AC

## 2020-10-21 ENCOUNTER — Other Ambulatory Visit (HOSPITAL_COMMUNITY): Payer: Self-pay

## 2020-10-25 ENCOUNTER — Telehealth: Payer: Self-pay | Admitting: Pharmacist

## 2020-10-25 NOTE — Progress Notes (Addendum)
Chronic Care Management Pharmacy Assistant   Name: Peter Dunlap  MRN: [id number] DOB: [birth year]  Reason for Encounter: General Disease State Call   Conditions to be addressed/monitored: CAD, CHF, Afib, HTN, GERD, Type II DM, Hypothyroidism, HLD, Gout, Chronic Pain  Recent office visits:  None since 07/13/20   Recent consult visits:  10/07/20 Oncology Derek Jack, MD.For metastatic castration-sensitive adenocarcinoma of prostate. No medication changes. Per note:infusion. 08/12/20 Oncology Derek Jack, MD.For metastatic castration-sensitive adenocarcinoma of prostate. No medication changes. Per note:infusion. 08/04/20 Endo Brita Romp, NP. For follow-up. No medication changes.  07/28/20 Ophthalmology Hyman Hopes, MD. For refraction. No medication changes. 07/27/20 Cardiology Taylor,Gregg W, MD. For follow-up. CHANGED/DECREASED Sotalol HCL 80 mg 2 tablets in the AM and 1 tablet in the PM. 07/22/20 Ophthalmology Hyman Hopes, MD  For eye exam. No medication changes. 07/15/20 Oncology Derek Jack, MD. For metastatic castration-sensitive adenocarcinoma of prostate. COMPLETED Amoxicillin. Per note: Infusion.  Hospital visits:  None since 07/13/20  Medications: Outpatient Encounter Medications as of 10/25/2020  Medication Sig   allopurinol (ZYLOPRIM) 100 MG tablet TAKE 1 TABLET(100 MG) BY MOUTH DAILY   apalutamide (ERLEADA) 60 MG tablet TAKE 4 TABLETS (240 MG TOTAL) BY MOUTH DAILY. MAY BE TAKEN WITH OR WITHOUT FOOD. SWALLOW TABLETS WHOLE.   aspirin EC 325 MG tablet Take 1 tablet (325 mg total) daily by mouth.   Blood Glucose Monitoring Suppl (BLOOD GLUCOSE SYSTEM PAK) KIT Use as directed to monitor FSBS 1x daily. Dx: E11.9.   colchicine 0.6 MG tablet    Cyanocobalamin (VITAMIN B 12) 500 MCG TABS Take by mouth.   DEXILANT 60 MG capsule TAKE 1 CAPSULE(60 MG) BY MOUTH DAILY   fluticasone (FLONASE) 50 MCG/ACT nasal spray Place 2 sprays into both  nostrils daily.   furosemide (LASIX) 20 MG tablet TAKE 3 TABLETS BY MOUTH DAILY   glipiZIDE (GLUCOTROL XL) 5 MG 24 hr tablet TAKE 1 TABLET(5 MG) BY MOUTH DAILY WITH BREAKFAST   isosorbide mononitrate (IMDUR) 60 MG 24 hr tablet TAKE 1 TABLET BY MOUTH DAILY   levothyroxine (SYNTHROID) 200 MCG tablet TAKE 1 TABLET(200 MCG) BY MOUTH DAILY BEFORE AND BREAKFAST   loratadine (CLARITIN) 10 MG tablet TAKE 1 TABLET(10 MG) BY MOUTH DAILY AS NEEDED FOR ALLERGIES   Magnesium Oxide 400 (240 Mg) MG TABS TAKE 1 TABLET(400 MG) BY MOUTH TWICE DAILY   metFORMIN (GLUCOPHAGE) 500 MG tablet TAKE 1 TABLET BY MOUTH TWICE DAILY   nitroGLYCERIN (NITROSTAT) 0.4 MG SL tablet PLACE 1 TABLET UNDER THE TONGUE EVERY 5 MINUTES AS NEEDED FOR CHEST PAIN. CALL 911 AT THIRD DOSE IN 15 MINUTES   ONETOUCH ULTRA test strip USE TO TEST ONCE DAILY   oxyCODONE-acetaminophen (PERCOCET) 10-325 MG tablet Take 1 tablet by mouth every 8 (eight) hours as needed for pain.   pantoprazole (PROTONIX) 40 MG tablet TAKE 1 TABLET(40 MG) BY MOUTH DAILY   silodosin (RAPAFLO) 8 MG CAPS capsule Take 1 capsule (8 mg total) by mouth 2 (two) times daily.   simvastatin (ZOCOR) 40 MG tablet TAKE 1 TABLET(40 MG) BY MOUTH DAILY (Patient not taking: Reported on 10/07/2020)   SIMVASTATIN PO    sotalol (BETAPACE) 80 MG tablet Take 2 tablets (160 mg) in the AM and 1 tablet (80 mg) in the PM   Vitamin D, Ergocalciferol, (DRISDOL) 1.25 MG (50000 UNIT) CAPS capsule TAKE 1 CAPSULE BY MOUTH EVERY 7 DAYS   No facility-administered encounter medications on file as of 10/25/2020.   GEN CALL: Spoke  with the patient in regards of his new PCP he stated he did find one and did not any assistance with anything regarding his medical care.   Star Rating Drugs: Metformin 500 mg Simvastatin 40 mg Glipizide 5 mg   Follow-Up:Pharmacist Review  Charlann Lange, RMA Clinical Pharmacist Assistant [phone]  10 minutes spent in review, coordination, and  documentation.  Reviewed by: Beverly Milch, PharmD Clinical Pharmacist [facility] [phone]

## 2020-11-04 ENCOUNTER — Ambulatory Visit (HOSPITAL_COMMUNITY): Payer: Medicare Other

## 2020-11-04 ENCOUNTER — Inpatient Hospital Stay (HOSPITAL_COMMUNITY): Payer: Medicare Other

## 2020-11-08 ENCOUNTER — Inpatient Hospital Stay (HOSPITAL_COMMUNITY): Payer: Medicare Other

## 2020-11-08 ENCOUNTER — Inpatient Hospital Stay (HOSPITAL_COMMUNITY): Payer: Medicare Other | Attending: Hematology

## 2020-11-08 ENCOUNTER — Other Ambulatory Visit: Payer: Self-pay

## 2020-11-08 VITALS — BP 99/69 | HR 65 | Temp 96.7°F | Resp 18

## 2020-11-08 DIAGNOSIS — Z7984 Long term (current) use of oral hypoglycemic drugs: Secondary | ICD-10-CM | POA: Diagnosis not present

## 2020-11-08 DIAGNOSIS — I252 Old myocardial infarction: Secondary | ICD-10-CM | POA: Diagnosis not present

## 2020-11-08 DIAGNOSIS — Z833 Family history of diabetes mellitus: Secondary | ICD-10-CM | POA: Insufficient documentation

## 2020-11-08 DIAGNOSIS — E785 Hyperlipidemia, unspecified: Secondary | ICD-10-CM | POA: Insufficient documentation

## 2020-11-08 DIAGNOSIS — Z803 Family history of malignant neoplasm of breast: Secondary | ICD-10-CM | POA: Diagnosis not present

## 2020-11-08 DIAGNOSIS — Z191 Hormone sensitive malignancy status: Secondary | ICD-10-CM

## 2020-11-08 DIAGNOSIS — Z87891 Personal history of nicotine dependence: Secondary | ICD-10-CM | POA: Insufficient documentation

## 2020-11-08 DIAGNOSIS — E039 Hypothyroidism, unspecified: Secondary | ICD-10-CM | POA: Diagnosis not present

## 2020-11-08 DIAGNOSIS — Z5111 Encounter for antineoplastic chemotherapy: Secondary | ICD-10-CM | POA: Insufficient documentation

## 2020-11-08 DIAGNOSIS — Z801 Family history of malignant neoplasm of trachea, bronchus and lung: Secondary | ICD-10-CM | POA: Insufficient documentation

## 2020-11-08 DIAGNOSIS — I110 Hypertensive heart disease with heart failure: Secondary | ICD-10-CM | POA: Insufficient documentation

## 2020-11-08 DIAGNOSIS — R143 Flatulence: Secondary | ICD-10-CM | POA: Insufficient documentation

## 2020-11-08 DIAGNOSIS — Z79899 Other long term (current) drug therapy: Secondary | ICD-10-CM | POA: Diagnosis not present

## 2020-11-08 DIAGNOSIS — Z800 Family history of malignant neoplasm of digestive organs: Secondary | ICD-10-CM | POA: Insufficient documentation

## 2020-11-08 DIAGNOSIS — R140 Abdominal distension (gaseous): Secondary | ICD-10-CM | POA: Diagnosis not present

## 2020-11-08 DIAGNOSIS — C61 Malignant neoplasm of prostate: Secondary | ICD-10-CM | POA: Diagnosis present

## 2020-11-08 DIAGNOSIS — R590 Localized enlarged lymph nodes: Secondary | ICD-10-CM | POA: Diagnosis not present

## 2020-11-08 DIAGNOSIS — Z808 Family history of malignant neoplasm of other organs or systems: Secondary | ICD-10-CM | POA: Insufficient documentation

## 2020-11-08 DIAGNOSIS — I509 Heart failure, unspecified: Secondary | ICD-10-CM | POA: Diagnosis not present

## 2020-11-08 DIAGNOSIS — Z8041 Family history of malignant neoplasm of ovary: Secondary | ICD-10-CM | POA: Insufficient documentation

## 2020-11-08 DIAGNOSIS — R5383 Other fatigue: Secondary | ICD-10-CM | POA: Diagnosis not present

## 2020-11-08 DIAGNOSIS — C778 Secondary and unspecified malignant neoplasm of lymph nodes of multiple regions: Secondary | ICD-10-CM | POA: Insufficient documentation

## 2020-11-08 DIAGNOSIS — K7460 Unspecified cirrhosis of liver: Secondary | ICD-10-CM | POA: Insufficient documentation

## 2020-11-08 DIAGNOSIS — I2510 Atherosclerotic heart disease of native coronary artery without angina pectoris: Secondary | ICD-10-CM | POA: Insufficient documentation

## 2020-11-08 DIAGNOSIS — Z8249 Family history of ischemic heart disease and other diseases of the circulatory system: Secondary | ICD-10-CM | POA: Diagnosis not present

## 2020-11-08 DIAGNOSIS — Z806 Family history of leukemia: Secondary | ICD-10-CM | POA: Insufficient documentation

## 2020-11-08 LAB — CBC WITH DIFFERENTIAL/PLATELET
Abs Immature Granulocytes: 0.02 10*3/uL (ref 0.00–0.07)
Basophils Absolute: 0.1 10*3/uL (ref 0.0–0.1)
Basophils Relative: 1 %
Eosinophils Absolute: 0.1 10*3/uL (ref 0.0–0.5)
Eosinophils Relative: 2 %
HCT: 36.7 % — ABNORMAL LOW (ref 39.0–52.0)
Hemoglobin: 12.5 g/dL — ABNORMAL LOW (ref 13.0–17.0)
Immature Granulocytes: 0 %
Lymphocytes Relative: 35 %
Lymphs Abs: 2.6 10*3/uL (ref 0.7–4.0)
MCH: 34.9 pg — ABNORMAL HIGH (ref 26.0–34.0)
MCHC: 34.1 g/dL (ref 30.0–36.0)
MCV: 102.5 fL — ABNORMAL HIGH (ref 80.0–100.0)
Monocytes Absolute: 0.7 10*3/uL (ref 0.1–1.0)
Monocytes Relative: 9 %
Neutro Abs: 4 10*3/uL (ref 1.7–7.7)
Neutrophils Relative %: 53 %
Platelets: 150 10*3/uL (ref 150–400)
RBC: 3.58 MIL/uL — ABNORMAL LOW (ref 4.22–5.81)
RDW: 13.1 % (ref 11.5–15.5)
WBC: 7.6 10*3/uL (ref 4.0–10.5)
nRBC: 0 % (ref 0.0–0.2)

## 2020-11-08 LAB — COMPREHENSIVE METABOLIC PANEL
ALT: 16 U/L (ref 0–44)
AST: 29 U/L (ref 15–41)
Albumin: 3.5 g/dL (ref 3.5–5.0)
Alkaline Phosphatase: 100 U/L (ref 38–126)
Anion gap: 7 (ref 5–15)
BUN: 15 mg/dL (ref 8–23)
CO2: 21 mmol/L — ABNORMAL LOW (ref 22–32)
Calcium: 9 mg/dL (ref 8.9–10.3)
Chloride: 106 mmol/L (ref 98–111)
Creatinine, Ser: 1.34 mg/dL — ABNORMAL HIGH (ref 0.61–1.24)
GFR, Estimated: 59 mL/min — ABNORMAL LOW (ref >60)
Glucose, Bld: 150 mg/dL — ABNORMAL HIGH (ref 70–99)
Potassium: 3.9 mmol/L (ref 3.5–5.1)
Sodium: 134 mmol/L — ABNORMAL LOW (ref 135–145)
Total Bilirubin: 0.7 mg/dL (ref 0.3–1.2)
Total Protein: 8.1 g/dL (ref 6.5–8.1)

## 2020-11-08 LAB — PSA: Prostatic Specific Antigen: 0.01 ng/mL (ref 0.00–4.00)

## 2020-11-08 MED ORDER — DEGARELIX ACETATE 80 MG ~~LOC~~ SOLR
80.0000 mg | Freq: Once | SUBCUTANEOUS | Status: AC
  Administered 2020-11-08: 80 mg via SUBCUTANEOUS
  Filled 2020-11-08: qty 4

## 2020-11-08 NOTE — Progress Notes (Signed)
Peter Dunlap presents today for Degarlix injection per the provider's orders.  Stable during administration without incident; injection site WNL; see MAR for injection details.  Patient tolerated procedure well and without incident.  No questions or complaints noted at this time.  Vital signs stable.  No complaints at this time.  Discharge from clinic ambulatory in stable condition.  Alert and oriented X 3.  Follow up with [facility] as scheduled.

## 2020-11-08 NOTE — Patient Instructions (Signed)
[facility]  Discharge Instructions: Thank you for choosing [facility] to provide your oncology and hematology care.  If you have a lab appointment with the [facility], please come in thru the Main Entrance and check in at the main information desk.  Wear comfortable clothing and clothing appropriate for easy access to any Portacath or PICC line.   We strive to give you quality time with your provider. You may need to reschedule your appointment if you arrive late (15 or more minutes).  Arriving late affects you and other patients whose appointments are after yours.  Also, if you miss three or more appointments without notifying the office, you may be dismissed from the clinic at the provider's discretion.      For prescription refill requests, have your pharmacy contact our office and allow 72 hours for refills to be completed.    Today you received the following chemotherapy and/or immunotherapy agents Firmagon injection   To help prevent nausea and vomiting after your treatment, we encourage you to take your nausea medication as directed.  BELOW ARE SYMPTOMS THAT SHOULD BE REPORTED IMMEDIATELY: . *FEVER GREATER THAN 100.4 F (38 C) OR HIGHER . *CHILLS OR SWEATING . *NAUSEA AND VOMITING THAT IS NOT CONTROLLED WITH YOUR NAUSEA MEDICATION . *UNUSUAL SHORTNESS OF BREATH . *UNUSUAL BRUISING OR BLEEDING . *URINARY PROBLEMS (pain or burning when urinating, or frequent urination) . *BOWEL PROBLEMS (unusual diarrhea, constipation, pain near the anus) . TENDERNESS IN MOUTH AND THROAT WITH OR WITHOUT PRESENCE OF ULCERS (sore throat, sores in mouth, or a toothache) . UNUSUAL RASH, SWELLING OR PAIN  . UNUSUAL VAGINAL DISCHARGE OR ITCHING   Items with * indicate a potential emergency and should be followed up as soon as possible or go to the Emergency Department if any problems should occur.  Please show the CHEMOTHERAPY ALERT CARD or IMMUNOTHERAPY ALERT CARD at  check-in to the Emergency Department and triage nurse.  Should you have questions after your visit or need to cancel or reschedule your appointment, please contact [facility] [phone]  and follow the prompts.  Office hours are 8:00 a.m. to 4:30 p.m. Monday - Friday. Please note that voicemails left after 4:00 p.m. may not be returned until the following business day.  We are closed weekends and major holidays. You have access to a nurse at all times for urgent questions. Please call the main number to the clinic [phone] and follow the prompts.  For any non-urgent questions, you may also contact your provider using MyChart. We now offer e-Visits for anyone 78 and older to request care online for non-urgent symptoms. For details visit [email or web address].   Also download the MyChart app! Go to the app store, search "MyChart", open the app, select [facility], and log in with your MyChart username and password.  Due to Covid, a mask is required upon entering the hospital/clinic. If you do not have a mask, one will be given to you upon arrival. For doctor visits, patients may have 1 support person aged 50 or older with them. For treatment visits, patients cannot have anyone with them due to current Covid guidelines and our immunocompromised population.

## 2020-11-09 LAB — TESTOSTERONE: Testosterone: 6 ng/dL — ABNORMAL LOW (ref 264–916)

## 2020-11-10 ENCOUNTER — Ambulatory Visit (INDEPENDENT_AMBULATORY_CARE_PROVIDER_SITE_OTHER): Payer: Medicare Other

## 2020-11-10 DIAGNOSIS — I495 Sick sinus syndrome: Secondary | ICD-10-CM

## 2020-11-10 LAB — CUP PACEART REMOTE DEVICE CHECK
Battery Remaining Longevity: 121 mo
Battery Remaining Percentage: 95.5 %
Battery Voltage: 2.96 V
Brady Statistic AP VP Percent: 1 %
Brady Statistic AP VS Percent: 9.7 %
Brady Statistic AS VP Percent: 1 %
Brady Statistic AS VS Percent: 90 %
Brady Statistic RA Percent Paced: 9.4 %
Brady Statistic RV Percent Paced: 1 %
Date Time Interrogation Session: 20220608020014
Implantable Lead Implant Date: 20160415
Implantable Lead Implant Date: 20160415
Implantable Lead Location: 753859
Implantable Lead Location: 753860
Implantable Pulse Generator Implant Date: 20160415
Lead Channel Impedance Value: 440 Ohm
Lead Channel Impedance Value: 560 Ohm
Lead Channel Pacing Threshold Amplitude: 0.75 V
Lead Channel Pacing Threshold Amplitude: 0.75 V
Lead Channel Pacing Threshold Pulse Width: 0.5 ms
Lead Channel Pacing Threshold Pulse Width: 0.5 ms
Lead Channel Sensing Intrinsic Amplitude: 12 mV
Lead Channel Sensing Intrinsic Amplitude: 4.3 mV
Lead Channel Setting Pacing Amplitude: 2 V
Lead Channel Setting Pacing Amplitude: 2.5 V
Lead Channel Setting Pacing Pulse Width: 0.5 ms
Lead Channel Setting Sensing Sensitivity: 2 mV
Pulse Gen Model: 2240
Pulse Gen Serial Number: 7756161

## 2020-11-15 ENCOUNTER — Other Ambulatory Visit (HOSPITAL_COMMUNITY): Payer: Self-pay | Admitting: Hematology

## 2020-11-15 ENCOUNTER — Other Ambulatory Visit (HOSPITAL_COMMUNITY): Payer: Self-pay

## 2020-11-15 MED ORDER — ERLEADA 60 MG PO TABS
ORAL_TABLET | ORAL | 3 refills | Status: DC
  Filled 2020-11-15: qty 120, 30d supply, fill #0
  Filled 2020-12-20: qty 120, 30d supply, fill #1
  Filled 2021-01-18: qty 120, 30d supply, fill #2
  Filled 2021-02-14: qty 120, 30d supply, fill #3

## 2020-11-16 ENCOUNTER — Other Ambulatory Visit (HOSPITAL_COMMUNITY): Payer: Self-pay

## 2020-11-23 ENCOUNTER — Other Ambulatory Visit: Payer: Self-pay | Admitting: Adult Medicine

## 2020-11-23 DIAGNOSIS — G8929 Other chronic pain: Secondary | ICD-10-CM

## 2020-11-23 DIAGNOSIS — M545 Low back pain, unspecified: Secondary | ICD-10-CM

## 2020-11-24 ENCOUNTER — Other Ambulatory Visit (HOSPITAL_COMMUNITY): Payer: Self-pay

## 2020-11-24 DIAGNOSIS — C61 Malignant neoplasm of prostate: Secondary | ICD-10-CM

## 2020-11-24 DIAGNOSIS — Z191 Hormone sensitive malignancy status: Secondary | ICD-10-CM

## 2020-11-25 ENCOUNTER — Other Ambulatory Visit: Payer: Self-pay

## 2020-11-25 ENCOUNTER — Inpatient Hospital Stay (HOSPITAL_COMMUNITY): Payer: Medicare Other

## 2020-11-25 DIAGNOSIS — C61 Malignant neoplasm of prostate: Secondary | ICD-10-CM

## 2020-11-25 DIAGNOSIS — Z5111 Encounter for antineoplastic chemotherapy: Secondary | ICD-10-CM | POA: Diagnosis not present

## 2020-11-25 DIAGNOSIS — Z191 Hormone sensitive malignancy status: Secondary | ICD-10-CM

## 2020-11-25 LAB — CBC WITH DIFFERENTIAL/PLATELET
Abs Immature Granulocytes: 0.02 10*3/uL (ref 0.00–0.07)
Basophils Absolute: 0.1 10*3/uL (ref 0.0–0.1)
Basophils Relative: 1 %
Eosinophils Absolute: 0.1 10*3/uL (ref 0.0–0.5)
Eosinophils Relative: 2 %
HCT: 34.2 % — ABNORMAL LOW (ref 39.0–52.0)
Hemoglobin: 11.8 g/dL — ABNORMAL LOW (ref 13.0–17.0)
Immature Granulocytes: 0 %
Lymphocytes Relative: 35 %
Lymphs Abs: 2.3 10*3/uL (ref 0.7–4.0)
MCH: 35.3 pg — ABNORMAL HIGH (ref 26.0–34.0)
MCHC: 34.5 g/dL (ref 30.0–36.0)
MCV: 102.4 fL — ABNORMAL HIGH (ref 80.0–100.0)
Monocytes Absolute: 0.6 10*3/uL (ref 0.1–1.0)
Monocytes Relative: 9 %
Neutro Abs: 3.5 10*3/uL (ref 1.7–7.7)
Neutrophils Relative %: 53 %
Platelets: 113 10*3/uL — ABNORMAL LOW (ref 150–400)
RBC: 3.34 MIL/uL — ABNORMAL LOW (ref 4.22–5.81)
RDW: 14.6 % (ref 11.5–15.5)
WBC: 6.6 10*3/uL (ref 4.0–10.5)
nRBC: 0 % (ref 0.0–0.2)

## 2020-11-25 LAB — COMPREHENSIVE METABOLIC PANEL
ALT: 19 U/L (ref 0–44)
AST: 96 U/L — ABNORMAL HIGH (ref 15–41)
Albumin: 3.4 g/dL — ABNORMAL LOW (ref 3.5–5.0)
Alkaline Phosphatase: 83 U/L (ref 38–126)
Anion gap: 8 (ref 5–15)
BUN: 12 mg/dL (ref 8–23)
CO2: 22 mmol/L (ref 22–32)
Calcium: 8.8 mg/dL — ABNORMAL LOW (ref 8.9–10.3)
Chloride: 107 mmol/L (ref 98–111)
Creatinine, Ser: 1.41 mg/dL — ABNORMAL HIGH (ref 0.61–1.24)
GFR, Estimated: 55 mL/min — ABNORMAL LOW (ref >60)
Glucose, Bld: 129 mg/dL — ABNORMAL HIGH (ref 70–99)
Potassium: 3.8 mmol/L (ref 3.5–5.1)
Sodium: 137 mmol/L (ref 135–145)
Total Bilirubin: 0.5 mg/dL (ref 0.3–1.2)
Total Protein: 7.2 g/dL (ref 6.5–8.1)

## 2020-11-25 LAB — PSA: Prostatic Specific Antigen: <0.01 ng/mL (ref 0.00–4.00)

## 2020-11-26 LAB — TESTOSTERONE: Testosterone: 5 ng/dL — ABNORMAL LOW (ref 264–916)

## 2020-11-29 ENCOUNTER — Other Ambulatory Visit: Payer: Self-pay

## 2020-12-01 LAB — TSH: TSH: 59 u[IU]/mL — ABNORMAL HIGH (ref 0.450–4.500)

## 2020-12-01 LAB — T4, FREE: Free T4: 0.27 ng/dL — ABNORMAL LOW (ref 0.82–1.77)

## 2020-12-02 ENCOUNTER — Encounter (HOSPITAL_COMMUNITY): Payer: Self-pay | Admitting: Hematology and Oncology

## 2020-12-02 ENCOUNTER — Inpatient Hospital Stay (HOSPITAL_BASED_OUTPATIENT_CLINIC_OR_DEPARTMENT_OTHER): Payer: Medicare Other | Admitting: Hematology and Oncology

## 2020-12-02 ENCOUNTER — Other Ambulatory Visit: Payer: Self-pay

## 2020-12-02 ENCOUNTER — Inpatient Hospital Stay (HOSPITAL_COMMUNITY): Payer: Medicare Other

## 2020-12-02 VITALS — BP 104/67 | HR 60 | Temp 96.9°F | Resp 20 | Wt 315.1 lb

## 2020-12-02 DIAGNOSIS — C61 Malignant neoplasm of prostate: Secondary | ICD-10-CM

## 2020-12-02 DIAGNOSIS — Z5111 Encounter for antineoplastic chemotherapy: Secondary | ICD-10-CM | POA: Diagnosis not present

## 2020-12-02 DIAGNOSIS — Z191 Hormone sensitive malignancy status: Secondary | ICD-10-CM

## 2020-12-02 MED ORDER — DEGARELIX ACETATE 80 MG ~~LOC~~ SOLR
80.0000 mg | Freq: Once | SUBCUTANEOUS | Status: AC
  Administered 2020-12-02: 80 mg via SUBCUTANEOUS
  Filled 2020-12-02: qty 4

## 2020-12-02 NOTE — Progress Notes (Unsigned)
After obtaining consent, and per orders of Dr. Delton Coombes, injection of 80 mg of Firmagon given by Elizabeth Sauer. Patient tolerated without complications.  Discharged ambulatory to home.

## 2020-12-02 NOTE — Progress Notes (Signed)
Peter Dunlap,  [id number]   CLINIC:  Medical Oncology/Hematology  PCP:  [facility] [address] / [place] [address] [phone]   REASON FOR VISIT:  Follow-up for metastatic castration sensitive prostate cancer  PRIOR THERAPY: IMRT x 40 sessions completed on 06/14/2016  NGS Results: Not done  CURRENT THERAPY: Mills Koller every month; Erleada 240 mg daily  BRIEF ONCOLOGIC HISTORY:  Oncology History  Prostate cancer ([place])  03/10/2016 Initial Diagnosis   Prostate cancer ([place])   05/23/2020 Genetic Testing   Negative genetic testing:  No pathogenic variants detected on the [facility] Common Hereditary Cancers Panel + Prostate Cancer HRR Panel. A variant of uncertain significance (VUS) was detected in the NF1 gene called c.1178A>G. The report date is 05/23/2020.  The Common Hereditary Cancers Panel offered by [facility] includes sequencing and/or deletion duplication testing of the following 47 genes: APC, ATM, AXIN2, BARD1, BMPR1A, BRCA1, BRCA2, BRIP1, CDH1, CDK4, CDKN2A (p14ARF), CDKN2A (p16INK4a), CHEK2, CTNNA1, DICER1, EPCAM (Deletion/duplication testing only), GREM1 (promoter region deletion/duplication testing only), KIT, MEN1, MLH1, MSH2, MSH3, MSH6, MUTYH, NBN, NF1, NTHL1, PALB2, PDGFRA, PMS2, POLD1, POLE, PTEN, RAD50, RAD51C, RAD51D, SDHB, SDHC, SDHD, SMAD4, SMARCA4. STK11, TP53, TSC1, TSC2, and VHL.  The following genes were evaluated for sequence changes only: SDHA and HOXB13 c.251G>A variant only. The Prostate Cancer HRR Panel offered by [facility] includes sequencing and/or deletion duplication analysis of the following 10 genes: ATM, BARD1, BRCA1, BRCA2, BRIP1, CHEK2, FANCL, PALB2, RAD51C, RAD51D.     CANCER STAGING:  Cancer Staging No matching staging information was found for the patient.  INTERVAL HISTORY:   [name], a 65 y.o. male, returns for routine follow-up of his metastatic castration sensitive  prostate cancer. Peter Dunlap was last seen on 08/12/2020.   He is on apalatumide and degralix. Doing well, feels tired today, continues to have some flatulence. He says his PCP changed his thyroid dose recently   REVIEW OF SYSTEMS:  Review of Systems  Constitutional:  Positive for fatigue (50%). Negative for appetite change.  Gastrointestinal:  Positive for abdominal distention. Negative for abdominal pain, diarrhea, nausea and vomiting.  Endocrine: Negative for hot flashes.  Musculoskeletal:  Negative for arthralgias and myalgias.  All other systems reviewed and are negative.  PAST MEDICAL/SURGICAL HISTORY:  Past Medical History:  Diagnosis Date   Arteriosclerotic cardiovascular disease (ASCVD) 2005   catheterization in 10/2010:50% mid LAD, diffuse distal disease, circumflex irregularities, large dominant RCA with a 50% ostial, 70% distal, 60% posterolateral and 70% PDA; normal EF   Arthritis    Benign prostatic hypertrophy    Bilateral carpal tunnel syndrome 07/03/2018   Cerebrovascular disease 2010   R. carotid endarterectomy; Duplex in 10/2010-widely patent ICAs, subtotal left vertebral-not thought to be contributing to symptoms   Cervical spine disease    CT in 2012-advanced degeneration and spondylosis with moderate spinal stenosis at C3-C6   CHF (congestive heart failure) ([place])    Depression    Diabetes mellitus without complication ([place])    Erectile dysfunction    Family history of breast cancer    Family history of cancer of mouth    Family history of CML (chronic monocytic leukemia)    Family history of lung cancer    Family history of ovarian cancer    Family history of stomach cancer    Gastroesophageal reflux disease    H/O hiatal hernia    H/O: substance abuse ([place])    Cocaine, marijuana, alcohol.  Quit  2013.    Hyperlipidemia    Hypertension    Non-ST elevation myocardial infarction (NSTEMI), initial episode of care ([place]) 12/02/2013   DES LAD   Obesity     Prostate cancer ([place])    Sleep apnea    CPAP   Tachy-brady syndrome ([place])    a. 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 25, 2010    CEA   CORONARY ANGIOPLASTY WITH STENT PLACEMENT  12/03/2013   LAD 90%-->0% W/ Promus Premier DES 3.0 mm x 16 mm, CFX OK, RCA 40%, EF 70-75%   LEFT ATRIAL APPENDAGE OCCLUSION N/A 08/05/2015   Procedure: LEFT ATRIAL APPENDAGE OCCLUSION;  Surgeon: [name], MD;  Location: [facility] CV LAB;  Service: Cardiovascular;  Laterality: N/A;   LEFT HEART CATHETERIZATION WITH CORONARY ANGIOGRAM Left 12/03/2013   Procedure: LEFT HEART CATHETERIZATION WITH CORONARY ANGIOGRAM;  Surgeon: Leonie Man, MD;  Location: [facility] CATH LAB;  Service: Cardiovascular;  Laterality: Left;   LEFT HEART CATHETERIZATION WITH CORONARY ANGIOGRAM N/A 01/26/2014   Procedure: LEFT HEART CATHETERIZATION WITH CORONARY ANGIOGRAM;  Surgeon: Jettie Booze, MD;  Location: [facility] CATH LAB;  Service: Cardiovascular;  Laterality: N/A;   LEFT HEART CATHETERIZATION WITH CORONARY ANGIOGRAM N/A 08/03/2014   Procedure: LEFT HEART CATHETERIZATION WITH CORONARY ANGIOGRAM;  Surgeon: Burnell Blanks, MD;  Location: [facility] CATH LAB;  Service: Cardiovascular;  Laterality: N/A;   PERCUTANEOUS CORONARY STENT INTERVENTION (PCI-S)  12/03/2013   Procedure: PERCUTANEOUS CORONARY STENT INTERVENTION (PCI-S);  Surgeon: Leonie Man, MD;  Location: [facility] CATH LAB;  Service: Cardiovascular;;   PERMANENT PACEMAKER INSERTION N/A 09/18/2014   Procedure: PERMANENT PACEMAKER INSERTION;  Surgeon: [name], MD;  Location: [facility] CATH LAB;  Service: Cardiovascular;   Laterality: N/A;   RADIOFREQUENCY ABLATION  2005   for PSVT   TEE WITHOUT CARDIOVERSION N/A 07/27/2015   Procedure: TRANSESOPHAGEAL ECHOCARDIOGRAM (TEE);  Surgeon: Lelon Perla, MD;  Location: [facility] ENDOSCOPY;  Service: Cardiovascular;  Laterality: N/A;   TEE WITHOUT CARDIOVERSION N/A 09/15/2015   Procedure: TRANSESOPHAGEAL ECHOCARDIOGRAM (TEE);  Surgeon: Thayer Headings, MD;  Location: [facility] ENDOSCOPY;  Service: Cardiovascular;  Laterality: N/A;    SOCIAL HISTORY:  Social History   Socioeconomic History   Marital status: Married    Spouse name: Not on file   Number of children: 0   Years of education: Not on file   Highest education level: Not on file  Occupational History   Occupation: Retired  Tobacco Use   Smoking status: Former    Packs/day: 1.00    Years: 40.00    Pack years: 40.00    Types: Cigarettes    Start date: 10/20/1972    Quit date: 10/10/2012    Years since quitting: 8.1   Smokeless tobacco: Never   Tobacco comments:    Quit in May.   Vaping Use   Vaping Use: Never used  Substance and Sexual Activity   Alcohol use: Yes    Alcohol/week: 0.0 standard drinks    Comment: former drinker-- sober since 2013.    Drug use:  No    Types: Cocaine    Comment: quit cocaine 10/2011   Sexual activity: Yes    Partners: Female  Other Topics Concern   Not on file  Social History Narrative   Lives in [place].   Social Determinants of Health   Financial Resource Strain: Low Risk    Difficulty of Paying Living Expenses: Not hard at all  Food Insecurity: No Food Insecurity   Worried About Charity fundraiser in the Last Year: Never true   [place] in the Last Year: Never true  Transportation Needs: No Transportation Needs   Lack of Transportation (Medical): No   Lack of Transportation (Non-Medical): No  Physical Activity: Inactive   Days of Exercise per Week: 0 days   Minutes of Exercise per Session: 0 min  Stress: No Stress Concern Present   Feeling of  Stress : Not at all  Social Connections: Moderately Isolated   Frequency of Communication with Friends and Family: More than three times a week   Frequency of Social Gatherings with Friends and Family: Once a week   Attends Religious Services: Never   Marine scientist or Organizations: No   Attends Music therapist: Never   Marital Status: Married  Human resources officer Violence: Not At Risk   Fear of Current or Ex-Partner: No   Emotionally Abused: No   Physically Abused: No   Sexually Abused: No    FAMILY HISTORY:  Family History  Problem Relation Age of Onset   Hypertension Mother        Cerebrovascular disease   Diabetes Mother    Coronary artery disease Father 62   Diabetes type II Father    Hypertension Father    Heart attack Father    Diabetes Brother    Hypertension Brother    Diabetes Sister    Hypertension Sister    Heart attack Sister 2   Leukemia Sister 37       CML   Breast cancer Maternal Grandmother        dx 46s   Lung cancer Maternal Uncle        dx >50, smoker   Cancer Cousin        mouth cancer, dx 68s, no smoking/chew tobacco hx (maternal 1st cousin)   Ovarian cancer Cousin        dx <50 (maternal 1st cousin)   Stomach cancer Cousin        dx 24s (maternal 1st cousin)   Cancer Cousin        type unknown to pt, dx >50 (paternal 1st cousin)    CURRENT MEDICATIONS:  Current Outpatient Medications  Medication Sig Dispense Refill   allopurinol (ZYLOPRIM) 100 MG tablet TAKE 1 TABLET(100 MG) BY MOUTH DAILY 90 tablet 0   apalutamide (ERLEADA) 60 MG tablet TAKE 4 TABLETS (240 MG TOTAL) BY MOUTH DAILY. MAY BE TAKEN WITH OR WITHOUT FOOD. SWALLOW TABLETS WHOLE. 120 tablet 3   aspirin EC 325 MG tablet Take 1 tablet (325 mg total) daily by mouth. 30 tablet 0   Blood Glucose Monitoring Suppl (BLOOD GLUCOSE SYSTEM PAK) KIT Use as directed to monitor FSBS 1x daily. Dx: E11.9. 1 kit 1   colchicine 0.6 MG tablet      Cyanocobalamin (VITAMIN B 12)  500 MCG TABS Take by mouth.     DEXILANT 60 MG capsule TAKE 1 CAPSULE(60 MG) BY MOUTH DAILY 30 capsule 5   docusate sodium (STOOL SOFTENER) 100 MG  capsule Take 100 mg by mouth 2 (two) times daily.     fluticasone (FLONASE) 50 MCG/ACT nasal spray Place 2 sprays into both nostrils daily. 16 g 6   furosemide (LASIX) 20 MG tablet TAKE 3 TABLETS BY MOUTH DAILY 270 tablet 3   glipiZIDE (GLUCOTROL XL) 5 MG 24 hr tablet TAKE 1 TABLET(5 MG) BY MOUTH DAILY WITH BREAKFAST 90 tablet 0   isosorbide mononitrate (IMDUR) 60 MG 24 hr tablet TAKE 1 TABLET BY MOUTH DAILY 90 tablet 2   levothyroxine (SYNTHROID) 200 MCG tablet TAKE 1 TABLET(200 MCG) BY MOUTH DAILY BEFORE AND BREAKFAST 90 tablet 0   loratadine (CLARITIN) 10 MG tablet TAKE 1 TABLET(10 MG) BY MOUTH DAILY AS NEEDED FOR ALLERGIES 30 tablet 2   Magnesium Oxide 400 (240 Mg) MG TABS TAKE 1 TABLET(400 MG) BY MOUTH TWICE DAILY 180 tablet 1   metFORMIN (GLUCOPHAGE) 500 MG tablet TAKE 1 TABLET BY MOUTH TWICE DAILY 180 tablet 0   nitroGLYCERIN (NITROSTAT) 0.4 MG SL tablet PLACE 1 TABLET UNDER THE TONGUE EVERY 5 MINUTES AS NEEDED FOR CHEST PAIN. CALL 911 AT THIRD DOSE IN 15 MINUTES 25 tablet 1   ONETOUCH ULTRA test strip USE TO TEST ONCE DAILY 100 strip 1   oxyCODONE-acetaminophen (PERCOCET) 10-325 MG tablet Take 1 tablet by mouth every 8 (eight) hours as needed for pain. 90 tablet 0   pantoprazole (PROTONIX) 40 MG tablet TAKE 1 TABLET(40 MG) BY MOUTH DAILY 30 tablet 3   silodosin (RAPAFLO) 8 MG CAPS capsule Take 1 capsule (8 mg total) by mouth 2 (two) times daily. 60 capsule 11   simvastatin (ZOCOR) 40 MG tablet TAKE 1 TABLET(40 MG) BY MOUTH DAILY 90 tablet 1   SIMVASTATIN PO      sotalol (BETAPACE) 80 MG tablet Take 2 tablets (160 mg) in the AM and 1 tablet (80 mg) in the PM 135 tablet 3   Vitamin D, Ergocalciferol, (DRISDOL) 1.25 MG (50000 UNIT) CAPS capsule TAKE 1 CAPSULE BY MOUTH EVERY 7 DAYS 12 capsule 0   No current facility-administered medications for  this visit.    ALLERGIES:  Allergies  Allergen Reactions   Trazodone And Nefazodone     Nightmares   Lactose Intolerance (Gi) Other (See Comments)    UPSET STOMACH     PHYSICAL EXAM:  Performance status (ECOG): 1 - Symptomatic but completely ambulatory  Vitals:   12/02/20 1050  BP: 104/67  Pulse: 60  Resp: 20  Temp: (!) 96.9 F (36.1 C)  SpO2: 99%   Wt Readings from Last 3 Encounters:  12/02/20 (!) 315 lb 1.6 oz (142.9 kg)  10/07/20 (!) 311 lb 1.1 oz (141.1 kg)  08/12/20 (!) 318 lb 1.6 oz (144.3 kg)   Physical Exam Vitals reviewed.  Constitutional:      Appearance: Normal appearance. He is obese.  Cardiovascular:     Rate and Rhythm: Normal rate and regular rhythm.     Pulses: Normal pulses.     Heart sounds: Normal heart sounds.  Pulmonary:     Effort: Pulmonary effort is normal.     Breath sounds: Normal breath sounds.  Neurological:     General: No focal deficit present.     Mental Status: He is alert and oriented to person, place, and time.  Psychiatric:        Mood and Affect: Mood normal.        Behavior: Behavior normal.     LABORATORY DATA:  I have reviewed the labs as listed.  CBC Latest Ref Rng & Units 11/25/2020 11/08/2020 10/07/2020  WBC 4.0 - 10.5 K/uL 6.6 7.6 4.7  Hemoglobin 13.0 - 17.0 g/dL 11.8(L) 12.5(L) 13.5  Hematocrit 39.0 - 52.0 % 34.2(L) 36.7(L) 39.5  Platelets 150 - 400 K/uL 113(L) 150 117(L)   CMP Latest Ref Rng & Units 11/25/2020 11/08/2020 10/07/2020  Glucose 70 - 99 mg/dL 129(H) 150(H) 208(H)  BUN 8 - 23 mg/dL $Remove'12 15 11  'bvFQbWm$ Creatinine 0.61 - 1.24 mg/dL 1.41(H) 1.34(H) 1.09  Sodium 135 - 145 mmol/L 137 134(L) 135  Potassium 3.5 - 5.1 mmol/L 3.8 3.9 3.9  Chloride 98 - 111 mmol/L 107 106 104  CO2 22 - 32 mmol/L 22 21(L) 21(L)  Calcium 8.9 - 10.3 mg/dL 8.8(L) 9.0 9.2  Total Protein 6.5 - 8.1 g/dL 7.2 8.1 7.7  Total Bilirubin 0.3 - 1.2 mg/dL 0.5 0.7 1.2  Alkaline Phos 38 - 126 U/L 83 100 82  AST 15 - 41 U/L 96(H) 29 28  ALT 0 - 44 U/L $Remo'19  16 13    'gcQQD$ DIAGNOSTIC IMAGING:  I have independently reviewed the scans and discussed with the patient. CUP PACEART REMOTE DEVICE CHECK  Result Date: 11/10/2020 Scheduled remote reviewed. Normal device function.  12 AMS events; longest logged 1 minute 8 seconds Next remote 91 days. HB    ASSESSMENT:  1.  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 -Status post IMRT, 40 sessions completed on 06/14/2016 by Dr. Tammi Klippel. -PSA 0.7 (10/15/2018), 0.8 (02/18/2019), 1.4 (December 2020), 1.4 (June 2021) -Prostate biopsy on 02/10/2020-1 out of 12 cores from left mid lateral region positive for adenocarcinoma. -CTAP on 03/09/2020 with newly enlarged left iliac lymph nodes measuring 1.1 x 0.9 cm.  Hepatic steatosis with early signs of cirrhosis. -F-18 PSMA PET scan on 04/12/2020 showed radiotracer activity associated with enlarged left external iliac lymph node 9 mm with SUV 7.6.  Small left common iliac lymph node 6 mm, SUV 8.6.  Left periaortic lymph node 5 mm, SUV 6.3.  Lymph node between IVC and aorta at the level of right renal hilum, SUV 8.6.  Lymph node deep to the IVC measuring 6 mm, SUV 5.4.  No skeletal metastasis. 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   2.  Social/family history: -He is a retired Nature conservation officer.  He lives at home with his wife.  He plays golf.  He quit smoking in 2015, smoked 1 pack/day for more than 20 years. -Sister has CML.  Maternal grandmother had breast cancer.  Maternal uncle had lung cancer.  2 maternal cousins had tongue cancer and uterine cancer.   3.  Cirrhosis: -Prior imaging showed cirrhosis of the liver with normal spleen.   PLAN:   1.  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   - Reviewed labs today which  showed elevated AST.  PSA continues to improve at 0.01. - Continue monthly degarelix. - RTC 1 months for follow-up with repeat PSA and testosterone.   2.  Elevated ALT, will monitor.  3  Hypothyroidism: - TSH 59 on most recent labs. He says his PCP adjusted his thyroid medicine and he doesn't know the dose. Asked him to find out about his current dose, it will need to be up titrated. He will find out and either let us know or let his PCP uptitrate it.  Orders placed this encounter:  No orders of the defined types were placed in this encounter.

## 2020-12-03 NOTE — Progress Notes (Signed)
Remote pacemaker transmission.   

## 2020-12-07 ENCOUNTER — Ambulatory Visit (INDEPENDENT_AMBULATORY_CARE_PROVIDER_SITE_OTHER): Payer: Medicare Other | Admitting: Nurse Practitioner

## 2020-12-07 ENCOUNTER — Other Ambulatory Visit: Payer: Self-pay

## 2020-12-07 ENCOUNTER — Encounter: Payer: Self-pay | Admitting: Nurse Practitioner

## 2020-12-07 ENCOUNTER — Encounter: Payer: Self-pay | Admitting: Urology

## 2020-12-07 ENCOUNTER — Ambulatory Visit (INDEPENDENT_AMBULATORY_CARE_PROVIDER_SITE_OTHER): Payer: Medicare Other | Admitting: Urology

## 2020-12-07 VITALS — BP 143/79 | HR 83

## 2020-12-07 VITALS — BP 147/79 | HR 60 | Ht 72.0 in | Wt 315.0 lb

## 2020-12-07 DIAGNOSIS — C61 Malignant neoplasm of prostate: Secondary | ICD-10-CM | POA: Diagnosis not present

## 2020-12-07 DIAGNOSIS — I10 Essential (primary) hypertension: Secondary | ICD-10-CM

## 2020-12-07 DIAGNOSIS — E1165 Type 2 diabetes mellitus with hyperglycemia: Secondary | ICD-10-CM

## 2020-12-07 DIAGNOSIS — E782 Mixed hyperlipidemia: Secondary | ICD-10-CM

## 2020-12-07 DIAGNOSIS — R350 Frequency of micturition: Secondary | ICD-10-CM | POA: Diagnosis not present

## 2020-12-07 DIAGNOSIS — N401 Enlarged prostate with lower urinary tract symptoms: Secondary | ICD-10-CM | POA: Diagnosis not present

## 2020-12-07 DIAGNOSIS — Z192 Hormone resistant malignancy status: Secondary | ICD-10-CM

## 2020-12-07 DIAGNOSIS — E559 Vitamin D deficiency, unspecified: Secondary | ICD-10-CM

## 2020-12-07 DIAGNOSIS — E890 Postprocedural hypothyroidism: Secondary | ICD-10-CM | POA: Diagnosis not present

## 2020-12-07 LAB — POCT GLYCOSYLATED HEMOGLOBIN (HGB A1C): Hemoglobin A1C: 6.6 % — AB (ref 4.0–5.6)

## 2020-12-07 LAB — BLADDER SCAN AMB NON-IMAGING: Scan Result: 7

## 2020-12-07 NOTE — Progress Notes (Signed)
12/07/2020                                                 Endocrinology Follow Up  Subjective:    Patient ID: Peter Dunlap, male    DOB: 10/31/1955, PCP Dulce Sellar, MD  Patient presents today for follow-up of hypothyroidism following radioactive iodine treatment, and type 2 diabetes.  Past Medical History:  Diagnosis Date   Arteriosclerotic cardiovascular disease (ASCVD) 2005   catheterization in 10/2010:50% mid LAD, diffuse distal disease, circumflex irregularities, large dominant RCA with a 50% ostial, 70% distal, 60% posterolateral and 70% PDA; normal EF   Arthritis    Benign prostatic hypertrophy    Bilateral carpal tunnel syndrome 07/03/2018   Cerebrovascular disease 2010   R. carotid endarterectomy; Duplex in 10/2010-widely patent ICAs, subtotal left vertebral-not thought to be contributing to symptoms   Cervical spine disease    CT in 2012-advanced degeneration and spondylosis with moderate spinal stenosis at C3-C6   CHF (congestive heart failure) ([place])    Depression    Diabetes mellitus without complication ([place])    Erectile dysfunction    Family history of breast cancer    Family history of cancer of mouth    Family history of CML (chronic monocytic leukemia)    Family history of lung cancer    Family history of ovarian cancer    Family history of stomach cancer    Gastroesophageal reflux disease    H/O hiatal hernia    H/O: substance abuse ([place])    Cocaine, marijuana, alcohol.  Quit 2013.    Hyperlipidemia    Hypertension    Non-ST elevation myocardial infarction (NSTEMI), initial episode of care ([place]) 12/02/2013   DES LAD   Obesity    Prostate cancer ([place])    Sleep apnea    CPAP   Tachy-brady syndrome ([place])    a. 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 25, 2010    CEA   CORONARY ANGIOPLASTY WITH STENT PLACEMENT  12/03/2013   LAD 90%-->0% W/ Promus Premier DES 3.0 mm x 16 mm, CFX OK, RCA 40%, EF 70-75%   LEFT ATRIAL APPENDAGE OCCLUSION N/A 08/05/2015   Procedure: LEFT ATRIAL APPENDAGE OCCLUSION;  Surgeon: [name], MD;  Location: [facility];  Service: Cardiovascular;  Laterality: N/A;   LEFT HEART CATHETERIZATION WITH CORONARY ANGIOGRAM Left 12/03/2013   Procedure: LEFT HEART CATHETERIZATION WITH CORONARY ANGIOGRAM;  Surgeon: Leonie Man, MD;  Location: [facility] CATH LAB;  Service: Cardiovascular;  Laterality: Left;   LEFT HEART CATHETERIZATION WITH CORONARY ANGIOGRAM N/A 01/26/2014   Procedure: LEFT HEART CATHETERIZATION WITH CORONARY ANGIOGRAM;  Surgeon: Jettie Booze, MD;  Location: [facility] CATH LAB;  Service: Cardiovascular;  Laterality: N/A;   LEFT HEART CATHETERIZATION WITH CORONARY ANGIOGRAM N/A 08/03/2014   Procedure: LEFT HEART CATHETERIZATION WITH CORONARY ANGIOGRAM;  Surgeon: Burnell Blanks, MD;  Location: [facility] CATH LAB;  Service: Cardiovascular;  Laterality: N/A;   PERCUTANEOUS CORONARY STENT INTERVENTION (PCI-S)  12/03/2013   Procedure: PERCUTANEOUS CORONARY STENT INTERVENTION (PCI-S);  Surgeon: Leonie Man, MD;  Location: [facility] CATH LAB;  Service: Cardiovascular;;   PERMANENT PACEMAKER INSERTION N/A 09/18/2014   Procedure: PERMANENT PACEMAKER INSERTION;  Surgeon: [name], MD;  Location: [facility] CATH LAB;  Service: Cardiovascular;  Laterality: N/A;   RADIOFREQUENCY ABLATION  2005   for PSVT   TEE WITHOUT CARDIOVERSION N/A 07/27/2015   Procedure: TRANSESOPHAGEAL ECHOCARDIOGRAM (TEE);  Surgeon: Lelon Perla, MD;  Location: [facility] ENDOSCOPY;  Service: Cardiovascular;  Laterality: N/A;   TEE WITHOUT CARDIOVERSION N/A 09/15/2015   Procedure: TRANSESOPHAGEAL  ECHOCARDIOGRAM (TEE);  Surgeon: Thayer Headings, MD;  Location: [facility] ENDOSCOPY;  Service: Cardiovascular;  Laterality: N/A;   Social History   Socioeconomic History   Marital status: Married    Spouse name: Not on file   Number of children: 0   Years of education: Not on file   Highest education level: Not on file  Occupational History   Occupation: Retired  Tobacco Use   Smoking status: Former    Packs/day: 1.00    Years: 40.00    Pack years: 40.00    Types: Cigarettes    Start date: 10/20/1972    Quit date: 10/10/2012    Years since quitting: 8.1   Smokeless tobacco: Never   Tobacco comments:    Quit in May.   Vaping Use   Vaping Use: Never used  Substance and Sexual Activity   Alcohol use: Yes    Alcohol/week: 0.0 standard drinks    Comment: former drinker-- sober since 2013.    Drug use: No    Types: Cocaine    Comment: quit cocaine 10/2011   Sexual activity: Yes    Partners: Female  Other Topics Concern   Not on file  Social History Narrative   Lives in [place].   Social Determinants of Health   Financial Resource Strain: Low Risk    Difficulty of Paying Living Expenses: Not hard at all  Food Insecurity: No Food Insecurity   Worried About Charity fundraiser in the Last Year: Never true   [place] in the Last Year: Never true  Transportation Needs: No Transportation Needs   Lack of Transportation (Medical): No   Lack of Transportation (Non-Medical): No  Physical Activity: Inactive   Days of Exercise per Week: 0 days   Minutes of Exercise per Session: 0 min  Stress: No Stress Concern Present   Feeling of Stress : Not at all  Social Connections: Moderately Isolated   Frequency of Communication with Friends and Family: More than three times a week   Frequency of Social Gatherings with Friends and Family: Once a week   Attends Religious Services: Never   Marine scientist or Organizations: No   Attends Archivist Meetings: Never    Marital Status: Married   Outpatient Encounter Medications as of 12/07/2020  Medication Sig   glipiZIDE (GLUCOTROL XL) 5 MG 24 hr tablet TAKE 1 TABLET(5 MG) BY MOUTH DAILY WITH BREAKFAST   metFORMIN (GLUCOPHAGE) 500 MG tablet TAKE 1 TABLET BY MOUTH TWICE DAILY   allopurinol (ZYLOPRIM) 100 MG tablet TAKE 1 TABLET(100 MG) BY MOUTH DAILY   apalutamide (ERLEADA) 60 MG tablet TAKE 4 TABLETS (240 MG TOTAL) BY MOUTH DAILY. MAY BE TAKEN WITH OR WITHOUT FOOD. SWALLOW TABLETS WHOLE.   aspirin EC 325 MG tablet Take 1 tablet (  325 mg total) daily by mouth.   Blood Glucose Monitoring Suppl (BLOOD GLUCOSE SYSTEM PAK) KIT Use as directed to monitor FSBS 1x daily. Dx: E11.9.   colchicine 0.6 MG tablet    Cyanocobalamin (VITAMIN B 12) 500 MCG TABS Take by mouth.   DEXILANT 60 MG capsule TAKE 1 CAPSULE(60 MG) BY MOUTH DAILY   docusate sodium (STOOL SOFTENER) 100 MG capsule Take 100 mg by mouth 2 (two) times daily.   fluticasone (FLONASE) 50 MCG/ACT nasal spray Place 2 sprays into both nostrils daily.   furosemide (LASIX) 20 MG tablet TAKE 3 TABLETS BY MOUTH DAILY   isosorbide mononitrate (IMDUR) 60 MG 24 hr tablet TAKE 1 TABLET BY MOUTH DAILY   levothyroxine (SYNTHROID) 200 MCG tablet TAKE 1 TABLET(200 MCG) BY MOUTH DAILY BEFORE AND BREAKFAST   loratadine (CLARITIN) 10 MG tablet TAKE 1 TABLET(10 MG) BY MOUTH DAILY AS NEEDED FOR ALLERGIES   Magnesium Oxide 400 (240 Mg) MG TABS TAKE 1 TABLET(400 MG) BY MOUTH TWICE DAILY   nitroGLYCERIN (NITROSTAT) 0.4 MG SL tablet PLACE 1 TABLET UNDER THE TONGUE EVERY 5 MINUTES AS NEEDED FOR CHEST PAIN. CALL 911 AT THIRD DOSE IN 15 MINUTES   ONETOUCH ULTRA test strip USE TO TEST ONCE DAILY   oxyCODONE-acetaminophen (PERCOCET) 10-325 MG tablet Take 1 tablet by mouth every 8 (eight) hours as needed for pain.   pantoprazole (PROTONIX) 40 MG tablet TAKE 1 TABLET(40 MG) BY MOUTH DAILY   silodosin (RAPAFLO) 8 MG CAPS capsule Take 1 capsule (8 mg total) by mouth 2 (two) times daily.    simvastatin (ZOCOR) 40 MG tablet TAKE 1 TABLET(40 MG) BY MOUTH DAILY   SIMVASTATIN PO    sotalol (BETAPACE) 80 MG tablet Take 2 tablets (160 mg) in the AM and 1 tablet (80 mg) in the PM   Vitamin D, Ergocalciferol, (DRISDOL) 1.25 MG (50000 UNIT) CAPS capsule TAKE 1 CAPSULE BY MOUTH EVERY 7 DAYS   No facility-administered encounter medications on file as of 12/07/2020.   ALLERGIES: Allergies  Allergen Reactions   Trazodone And Nefazodone     Nightmares   Lactose Intolerance (Gi) Other (See Comments)    UPSET STOMACH    VACCINATION STATUS: Immunization History  Administered Date(s) Administered   Influenza,inj,Quad PF,6+ Mos 02/17/2014, 03/09/2015, 03/10/2016, 03/27/2017, 03/20/2018, 03/04/2019, 03/15/2020   Moderna Sars-Covid-2 Vaccination 08/03/2019, 09/06/2019   Pneumococcal Polysaccharide-23 01/05/2014   Tdap 12/19/2010   Zoster Recombinat (Shingrix) 12/17/2019, 02/15/2020    Diabetes He presents for his follow-up diabetic visit. He has type 2 diabetes mellitus. His disease course has been improving. There are no hypoglycemic associated symptoms. Pertinent negatives for hypoglycemia include no headaches, nervousness/anxiousness or tremors. Associated symptoms include fatigue. Pertinent negatives for diabetes include no chest pain, no foot paresthesias, no polydipsia, no polyuria and no weight loss. There are no hypoglycemic complications. Symptoms are stable. Diabetic complications include heart disease, impotence and peripheral neuropathy. Risk factors for coronary artery disease include hypertension, diabetes mellitus, dyslipidemia, male sex, obesity, tobacco exposure and sedentary lifestyle. Current diabetic treatment includes oral agent (dual therapy). He is compliant with treatment most of the time. His weight is fluctuating minimally. He is following a generally unhealthy diet. When asked about meal planning, he reported none. He has not had a previous visit with a dietitian. He  never participates in exercise. (He presents today with no meter or logs to review.  His POCT A1c today is 6.6%, improving from last visit of 6.9%.  He denies any s/s of hypoglycemia. )  An ACE inhibitor/angiotensin II receptor blocker is not being taken. He does not see a podiatrist.Eye exam is not current.  Thyroid Problem Presents for follow-up (He was given RAI therapy on March 16, 2014. - he denies family history of thyroid dysfunction .) visit. Symptoms include fatigue. Patient reports no anxiety, cold intolerance, constipation, diarrhea, heat intolerance, leg swelling, palpitations, tremors, weight gain or weight loss. The symptoms have been stable.     Review of systems  Constitutional: + Minimally fluctuating body weight,  current Body mass index is 42.72 kg/m. , + fatigue, no subjective hyperthermia, no subjective hypothermia Eyes: no blurry vision, no xerophthalmia ENT: no sore throat, no nodules palpated in throat, no dysphagia/odynophagia, no hoarseness Cardiovascular: no chest pain, no shortness of breath, no palpitations, no leg swelling Respiratory: no cough, no shortness of breath Gastrointestinal: no nausea/vomiting/diarrhea Musculoskeletal: no muscle/joint aches Neurological: no tremors, no numbness, no tingling, + intermittent dizziness with position changes Psychiatric: no depression, no anxiety    Objective:    BP (!) 147/79   Pulse 60   Ht 6' (1.829 m)   Wt (!) 315 lb (142.9 kg)   BMI 42.72 kg/m   Wt Readings from Last 3 Encounters:  12/07/20 (!) 315 lb (142.9 kg)  12/02/20 (!) 315 lb 1.6 oz (142.9 kg)  10/07/20 (!) 311 lb 1.1 oz (141.1 kg)    BP Readings from Last 3 Encounters:  12/07/20 (!) 147/79  12/02/20 104/67  11/08/20 99/69     Physical Exam- Limited  Constitutional:  Body mass index is 42.72 kg/m. , not in acute distress, normal state of mind Eyes:  EOMI, no exophthalmos Neck: Supple Cardiovascular: RRR, no murmurs, rubs, or gallops, no  edema Respiratory: Adequate breathing efforts, no crackles, rales, rhonchi, or wheezing Musculoskeletal: no gross deformities, strength intact in all four extremities, no gross restriction of joint movements Skin:  no rashes, no hyperemia Neurological: no tremor with outstretched hands    Diabetic Labs (most recent): Lab Results  Component Value Date   HGBA1C 6.6 (A) 12/07/2020   HGBA1C 6.9 08/04/2020   HGBA1C 7.0 (H) 04/19/2020     Lipid Panel ( most recent) Lipid Panel     Component Value Date/Time   CHOL 294 (H) 09/08/2020 0000   TRIG 233 (H) 09/08/2020 0000   HDL 34 (L) 09/08/2020 0000   CHOLHDL 8.6 (H) 09/08/2020 0000   VLDL 28 10/17/2016 0819   LDLCALC 217 (H) 09/08/2020 0000    Recent Results (from the past 2160 hour(s))  CBC with Differential/Platelet     Status: Abnormal   Collection Time: 09/10/20  8:58 AM  Result Value Ref Range   WBC 6.0 4.0 - 10.5 K/uL   RBC 3.72 (L) 4.22 - 5.81 MIL/uL   Hemoglobin 13.3 13.0 - 17.0 g/dL   HCT 38.3 (L) 39.0 - 52.0 %   MCV 103.0 (H) 80.0 - 100.0 fL   MCH 35.8 (H) 26.0 - 34.0 pg   MCHC 34.7 30.0 - 36.0 g/dL   RDW 13.8 11.5 - 15.5 %   Platelets 112 (L) 150 - 400 K/uL    Comment: SPECIMEN CHECKED FOR CLOTS Immature Platelet Fraction may be clinically indicated, consider ordering this additional test [id number] REPEATED TO VERIFY PLATELET COUNT CONFIRMED BY SMEAR    nRBC 0.0 0.0 - 0.2 %   Neutrophils Relative % 62 %   Neutro Abs 3.7 1.7 - 7.7 K/uL   Lymphocytes Relative 26 %   Lymphs Abs 1.6 0.7 - 4.0 K/uL  Monocytes Relative 9 %   Monocytes Absolute 0.5 0.1 - 1.0 K/uL   Eosinophils Relative 2 %   Eosinophils Absolute 0.1 0.0 - 0.5 K/uL   Basophils Relative 1 %   Basophils Absolute 0.1 0.0 - 0.1 K/uL   Immature Granulocytes 0 %   Abs Immature Granulocytes 0.01 0.00 - 0.07 K/uL    Comment: Performed at [facility], [address], [place], [place] [address]  Comprehensive metabolic panel     Status: Abnormal    Collection Time: 09/10/20  8:58 AM  Result Value Ref Range   Sodium 138 135 - 145 mmol/L   Potassium 3.7 3.5 - 5.1 mmol/L   Chloride 105 98 - 111 mmol/L   CO2 22 22 - 32 mmol/L   Glucose, Bld 194 (H) 70 - 99 mg/dL    Comment: Glucose reference range applies only to samples taken after fasting for at least 8 hours.   BUN 10 8 - 23 mg/dL   Creatinine, Ser 1.02 0.61 - 1.24 mg/dL   Calcium 9.0 8.9 - 10.3 mg/dL   Total Protein 7.7 6.5 - 8.1 g/dL   Albumin 3.5 3.5 - 5.0 g/dL   AST 31 15 - 41 U/L   ALT 14 0 - 44 U/L   Alkaline Phosphatase 104 38 - 126 U/L   Total Bilirubin 1.3 (H) 0.3 - 1.2 mg/dL   GFR, Estimated >60 >60 mL/min    Comment: (NOTE) Calculated using the CKD-EPI Creatinine Equation (2021)    Anion gap 11 5 - 15    Comment: Performed at [facility], [address] [place], [place] [address]  PSA     Status: None   Collection Time: 10/07/20  9:02 AM  Result Value Ref Range   Prostatic Specific Antigen 0.01 0.00 - 4.00 ng/mL    Comment: (NOTE) While PSA levels of <=4.0 ng/ml are reported as reference range, some men with levels below 4.0 ng/ml can have prostate cancer and many men with PSA above 4.0 ng/ml do not have prostate cancer.  Other tests such as free PSA, age specific reference ranges, PSA velocity and PSA doubling time may be helpful especially in men less than 49 years old. Performed at [facility], [place] [address], [place], [place] [address]   Comprehensive metabolic panel     Status: Abnormal   Collection Time: 10/07/20  9:16 AM  Result Value Ref Range   Sodium 135 135 - 145 mmol/L   Potassium 3.9 3.5 - 5.1 mmol/L   Chloride 104 98 - 111 mmol/L   CO2 21 (L) 22 - 32 mmol/L   Glucose, Bld 208 (H) 70 - 99 mg/dL    Comment: Glucose reference range applies only to samples taken after fasting for at least 8 hours.   BUN 11 8 - 23 mg/dL   Creatinine, Ser 1.09 0.61 - 1.24 mg/dL   Calcium 9.2 8.9 - 10.3 mg/dL   Total Protein 7.7 6.5 - 8.1 g/dL   Albumin  3.6 3.5 - 5.0 g/dL   AST 28 15 - 41 U/L   ALT 13 0 - 44 U/L   Alkaline Phosphatase 82 38 - 126 U/L   Total Bilirubin 1.2 0.3 - 1.2 mg/dL   GFR, Estimated >60 >60 mL/min    Comment: (NOTE) Calculated using the CKD-EPI Creatinine Equation (2021)    Anion gap 10 5 - 15    Comment: Performed at [facility], [address] [place],  [address]  CBC with  Differential     Status: Abnormal   Collection Time: 10/07/20  9:37 AM  Result Value Ref Range   WBC 4.7 4.0 - 10.5 K/uL   RBC 3.81 (L) 4.22 - 5.81 MIL/uL   Hemoglobin 13.5 13.0 - 17.0 g/dL   HCT 39.5 39.0 - 52.0 %   MCV 103.7 (H) 80.0 - 100.0 fL   MCH 35.4 (H) 26.0 - 34.0 pg   MCHC 34.2 30.0 - 36.0 g/dL   RDW 13.4 11.5 - 15.5 %   Platelets 117 (L) 150 - 400 K/uL    Comment: Immature Platelet Fraction may be clinically indicated, consider ordering this additional test [id number]    nRBC 0.0 0.0 - 0.2 %   Neutrophils Relative % 47 %   Neutro Abs 2.2 1.7 - 7.7 K/uL   Lymphocytes Relative 39 %   Lymphs Abs 1.8 0.7 - 4.0 K/uL   Monocytes Relative 10 %   Monocytes Absolute 0.5 0.1 - 1.0 K/uL   Eosinophils Relative 2 %   Eosinophils Absolute 0.1 0.0 - 0.5 K/uL   Basophils Relative 2 %   Basophils Absolute 0.1 0.0 - 0.1 K/uL   Immature Granulocytes 0 %   Abs Immature Granulocytes 0.01 0.00 - 0.07 K/uL    Comment: Performed at [facility], [address], [place], [place] [address]  CBC with Differential/Platelet     Status: Abnormal   Collection Time: 11/08/20  1:29 PM  Result Value Ref Range   WBC 7.6 4.0 - 10.5 K/uL   RBC 3.58 (L) 4.22 - 5.81 MIL/uL   Hemoglobin 12.5 (L) 13.0 - 17.0 g/dL   HCT 36.7 (L) 39.0 - 52.0 %   MCV 102.5 (H) 80.0 - 100.0 fL   MCH 34.9 (H) 26.0 - 34.0 pg   MCHC 34.1 30.0 - 36.0 g/dL   RDW 13.1 11.5 - 15.5 %   Platelets 150 150 - 400 K/uL   nRBC 0.0 0.0 - 0.2 %   Neutrophils Relative % 53 %   Neutro Abs 4.0 1.7 - 7.7 K/uL   Lymphocytes Relative 35 %   Lymphs Abs 2.6 0.7 - 4.0 K/uL   Monocytes  Relative 9 %   Monocytes Absolute 0.7 0.1 - 1.0 K/uL   Eosinophils Relative 2 %   Eosinophils Absolute 0.1 0.0 - 0.5 K/uL   Basophils Relative 1 %   Basophils Absolute 0.1 0.0 - 0.1 K/uL   Immature Granulocytes 0 %   Abs Immature Granulocytes 0.02 0.00 - 0.07 K/uL    Comment: Performed at [facility], [address], [place],  [address]  Comprehensive metabolic panel     Status: Abnormal   Collection Time: 11/08/20  1:29 PM  Result Value Ref Range   Sodium 134 (L) 135 - 145 mmol/L   Potassium 3.9 3.5 - 5.1 mmol/L   Chloride 106 98 - 111 mmol/L   CO2 21 (L) 22 - 32 mmol/L   Glucose, Bld 150 (H) 70 - 99 mg/dL    Comment: Glucose reference range applies only to samples taken after fasting for at least 8 hours.   BUN 15 8 - 23 mg/dL   Creatinine, Ser 1.34 (H) 0.61 - 1.24 mg/dL   Calcium 9.0 8.9 - 10.3 mg/dL   Total Protein 8.1 6.5 - 8.1 g/dL   Albumin 3.5 3.5 - 5.0 g/dL   AST 29 15 - 41 U/L   ALT 16 0 - 44 U/L   Alkaline Phosphatase 100 38 - 126 U/L  Total Bilirubin 0.7 0.3 - 1.2 mg/dL   GFR, Estimated 59 (L) >60 mL/min    Comment: (NOTE) Calculated using the CKD-EPI Creatinine Equation (2021)    Anion gap 7 5 - 15    Comment: Performed at [facility], [address] [place], [place] [address]  PSA     Status: None   Collection Time: 11/08/20  1:29 PM  Result Value Ref Range   Prostatic Specific Antigen 0.01 0.00 - 4.00 ng/mL    Comment: (NOTE) While PSA levels of <=4.0 ng/ml are reported as reference range, some men with levels below 4.0 ng/ml can have prostate cancer and many men with PSA above 4.0 ng/ml do not have prostate cancer.  Other tests such as free PSA, age specific reference ranges, PSA velocity and PSA doubling time may be helpful especially in men less than 10 years old. Performed at [facility], [place] [address] [place], [place] [address]   Testosterone     Status: Abnormal   Collection Time: 11/08/20  1:29 PM  Result Value Ref Range    Testosterone 6 (L) 264 - 916 ng/dL    Comment: (NOTE) Adult male reference interval is based on a population of healthy nonobese males (BMI <30) between 38 and 18 years old. [place], [place] [phone]. PMID: [id number]. Performed At: [facility] [place], [place] [id number] Rush Farmer MD [id number]   CUP PACEART REMOTE DEVICE CHECK     Status: None   Collection Time: 11/10/20  2:00 AM  Result Value Ref Range   Date Time Interrogation Session [id number]    Pulse Generator Manufacturer [employer]    Pulse Gen Model 2240 Assurity    Pulse Gen Serial Number [id number]    Clinic Name [facility] Pulse Generator Type Implantable Pulse Generator    Implantable Pulse Generator Implant Date 93790240    Implantable Lead Manufacturer [facility]    Implantable Lead Model 1688TC Tendril SDX    Implantable Lead Serial Number [id number]    Implantable Lead Implant Date 42683419    Implantable Lead Location Detail 1 UNKNOWN    Implantable Lead Location [id number]    Implantable Lead Manufacturer [employer]    Implantable Lead Model 1888TC Tendril ST Optim    Implantable Lead Serial Number [id number]    Implantable Lead Implant Date 62229798    Implantable Lead Location Detail 1 UNKNOWN    Implantable Lead Location [id number]    Lead Channel Setting Sensing Sensitivity 2.0 mV   Lead Channel Setting Sensing Adaptation Mode Fixed Pacing    Lead Channel Setting Pacing Amplitude 2.0 V   Lead Channel Setting Pacing Pulse Width 0.5 ms   Lead Channel Setting Pacing Amplitude 2.5 V   Lead Channel Status NULL    Lead Channel Impedance Value 440 ohm   Lead Channel Sensing Intrinsic Amplitude 4.3 mV   Lead Channel Pacing Threshold Amplitude 0.75 V   Lead Channel Pacing Threshold Pulse Width 0.5 ms   Lead Channel Status NULL    Lead Channel Impedance Value 560 ohm   Lead Channel Sensing Intrinsic Amplitude 12.0 mV   Lead Channel Pacing Threshold Amplitude 0.75 V    Lead Channel Pacing Threshold Pulse Width 0.5 ms   Battery Status MOS    Battery Remaining Longevity 121 mo   Battery Remaining Percentage 95.5 %   Battery Voltage 2.96 V   Brady Statistic RA Percent Paced 9.4 %   Brady Statistic RV Percent Paced 1.0 %  Brady Statistic AP VP Percent 1.0 %   Brady Statistic AS VP Percent 1.0 %   Brady Statistic AP VS Percent 9.7 %   Brady Statistic AS VS Percent 90.0 %  CBC with Differential     Status: Abnormal   Collection Time: 11/25/20  1:28 PM  Result Value Ref Range   WBC 6.6 4.0 - 10.5 K/uL   RBC 3.34 (L) 4.22 - 5.81 MIL/uL   Hemoglobin 11.8 (L) 13.0 - 17.0 g/dL   HCT 78.4 (L) 12.8 - 20.8 %   MCV 102.4 (H) 80.0 - 100.0 fL   MCH 35.3 (H) 26.0 - 34.0 pg   MCHC 34.5 30.0 - 36.0 g/dL   RDW 13.8 87.1 - 95.9 %   Platelets 113 (L) 150 - 400 K/uL    Comment: SPECIMEN CHECKED FOR CLOTS Immature Platelet Fraction may be clinically indicated, consider ordering this additional test [id number] PLATELET COUNT CONFIRMED BY SMEAR    nRBC 0.0 0.0 - 0.2 %   Neutrophils Relative % 53 %   Neutro Abs 3.5 1.7 - 7.7 K/uL   Lymphocytes Relative 35 %   Lymphs Abs 2.3 0.7 - 4.0 K/uL   Monocytes Relative 9 %   Monocytes Absolute 0.6 0.1 - 1.0 K/uL   Eosinophils Relative 2 %   Eosinophils Absolute 0.1 0.0 - 0.5 K/uL   Basophils Relative 1 %   Basophils Absolute 0.1 0.0 - 0.1 K/uL   Immature Granulocytes 0 %   Abs Immature Granulocytes 0.02 0.00 - 0.07 K/uL    Comment: Performed at [facility], [address] [place], [place] [address]  Comprehensive metabolic panel     Status: Abnormal   Collection Time: 11/25/20  1:28 PM  Result Value Ref Range   Sodium 137 135 - 145 mmol/L   Potassium 3.8 3.5 - 5.1 mmol/L   Chloride 107 98 - 111 mmol/L   CO2 22 22 - 32 mmol/L   Glucose, Bld 129 (H) 70 - 99 mg/dL    Comment: Glucose reference range applies only to samples taken after fasting for at least 8 hours.   BUN 12 8 - 23 mg/dL   Creatinine, Ser 2.57 (H) 0.61  - 1.24 mg/dL   Calcium 8.8 (L) 8.9 - 10.3 mg/dL   Total Protein 7.2 6.5 - 8.1 g/dL   Albumin 3.4 (L) 3.5 - 5.0 g/dL   AST 96 (H) 15 - 41 U/L   ALT 19 0 - 44 U/L   Alkaline Phosphatase 83 38 - 126 U/L   Total Bilirubin 0.5 0.3 - 1.2 mg/dL   GFR, Estimated 55 (L) >60 mL/min    Comment: (NOTE) Calculated using the CKD-EPI Creatinine Equation (2021)    Anion gap 8 5 - 15    Comment: Performed at [facility], [address] [place], [place] [address]  PSA     Status: None   Collection Time: 11/25/20  1:28 PM  Result Value Ref Range   Prostatic Specific Antigen <0.01 0.00 - 4.00 ng/mL    Comment: (NOTE) While PSA levels of <=4.0 ng/ml are reported as reference range, some men with levels below 4.0 ng/ml can have prostate cancer and many men with PSA above 4.0 ng/ml do not have prostate cancer.  Other tests such as free PSA, age specific reference ranges, PSA velocity and PSA doubling time may be helpful especially in men less than 36 years old. Performed at [facility], [address] [place], [place] [address]  Testosterone     Status: Abnormal   Collection Time: 11/25/20  1:28 PM  Result Value Ref Range   Testosterone 5 (L) 264 - 916 ng/dL    Comment: (NOTE) Adult male reference interval is based on a population of healthy nonobese males (BMI <30) between 46 and 84 years old. Erin, McLeod [phone]. PMID: [id number]. Performed At: [facility] [place], [place] [id number] Rush Farmer MD RX:[id number]   TSH     Status: Abnormal   Collection Time: 11/30/20  9:05 AM  Result Value Ref Range   TSH 59.000 (H) 0.450 - 4.500 uIU/mL  T4, free     Status: Abnormal   Collection Time: 11/30/20  9:05 AM  Result Value Ref Range   Free T4 0.27 (L) 0.82 - 1.77 ng/dL  HgB A1c     Status: Abnormal   Collection Time: 12/07/20  9:31 AM  Result Value Ref Range   Hemoglobin A1C 6.6 (A) 4.0 - 5.6 %   HbA1c POC (<> result, manual entry)      HbA1c, POC (prediabetic range)     HbA1c, POC (controlled diabetic range)       Assessment & Plan:   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  He states he started seeing a pain management doctor who recently adjusted his Levothyroxine dose but does not know what his current dose is.  He is instructed to call me once he gets home to let me know his current dose of thyroid replacement.  He also assures me he is taking his medication appropriately, but this is something he has struggled with in the past.     - We discussed about the correct intake of his thyroid hormone, on empty stomach at fasting, with water, separated by at least 30 minutes from breakfast and other medications,  and separated by more than 4 hours from calcium, iron, multivitamins, acid reflux medications (PPIs). -Patient is made aware of the fact that thyroid hormone replacement is needed for life, dose to be adjusted by periodic monitoring of thyroid function tests.  2. Diabetes mellitus type 2 in obese [facility])  He presents today with no meter or logs to review.  His POCT A1c today is 6.6%, improving from last visit of 6.9%.  He denies any s/s of hypoglycemia.   - He is advised to continue Metformin 500 mg p.o. twice daily with meals and Glipizide to 5 mg XL daily with breakfast.   -He is encouraged to monitor glucose 1-2 times per day, before breakfast and before bed, and call the clinic if he has readings less than 70 or greater than 300 for 3 tests in a row.  -He will be considered for incretin therapy on subsequent visits.  - Nutritional counseling repeated at each appointment due to patients tendency to fall back in to old habits.  - The patient admits there is a room for improvement in their diet and drink choices. -  Suggestion is made for the patient to avoid simple carbohydrates from their diet including Cakes, Sweet Desserts / Pastries, Ice Cream, Soda  (diet and regular), Sweet Tea, Candies, Chips, Cookies, Sweet Pastries, Store Bought Juices, Alcohol in Excess of 1-2 drinks a day, Artificial Sweeteners, Coffee Creamer, and "Sugar-free" Products. This will help patient to have stable blood glucose profile and potentially avoid unintended weight gain.   - I encouraged the patient to switch to unprocessed or minimally processed complex starch  and increased protein intake (animal or plant source), fruits, and vegetables.   - Patient is advised to stick to a routine mealtimes to eat 3 meals a day and avoid unnecessary snacks (to snack only to correct hypoglycemia).  3. Essential hypertension His blood pressure is slightly elevated today.  He had not long taken his medications prior to today's visit.  He is advised to continue Lasix 60 mg po daily.  4.  Hyperlipidemia:  His most recent lipid panel from 03/30/20 shows controlled LDL at 64.  He is advised to continue Simvastatin 40 mg po daily at bedtime.  Side effects and precautions discussed with him.  5. Vitamin D Deficiency: His most recent vitamin D level on 03/30/20 shows improvement to 68.  He has completed replenishment with ergocalciferol.  No need for additional supplementation at this time.   - I advised patient to maintain close follow up with Dulce Sellar, MD for primary care needs.     I spent 31 minutes in the care of the patient today including review of labs from [place], Lipids, Thyroid Function, Hematology (current and previous including abstractions from other facilities); face-to-face time discussing  his blood glucose readings/logs, discussing hypoglycemia and hyperglycemia episodes and symptoms, medications doses, his options of short and long term treatment based on the latest standards of care / guidelines;  discussion about incorporating lifestyle medicine;  and documenting the encounter.    Please refer to Patient Instructions for Blood Glucose Monitoring and  Insulin/Medications Dosing Guide"  in media tab for additional information. Please  also refer to " Patient Self Inventory" in the Media  tab for reviewed elements of pertinent patient history.  [name] participated in the discussions, expressed understanding, and voiced agreement with the above plans.  All questions were answered to his satisfaction. he is encouraged to contact clinic should he have any questions or concerns prior to his return visit.    Follow up plan: Return in about 3 months (around 03/09/2021) for Diabetes F/U with A1c in office, Thyroid follow up, Previsit labs.  Rayetta Pigg, [facility] [address] [place], [place] [address] Phone: [phone] Fax: [phone]   12/07/2020, 9:31 AM

## 2020-12-07 NOTE — Progress Notes (Signed)
post void residual=7  Urological Symptom Review  Patient is experiencing the following symptoms: Weak stream   Review of Systems  Gastrointestinal (upper)  : Negative for upper GI symptoms  Gastrointestinal (lower) : Negative for lower GI symptoms  Constitutional : Night Sweats Weight loss  Skin: Negative for skin symptoms  Eyes: Blurred vision  Ear/Nose/Throat : Negative for Ear/Nose/Throat symptoms  Hematologic/Lymphatic: Negative for Hematologic/Lymphatic symptoms  Cardiovascular : Negative for cardiovascular symptoms  Respiratory : Negative for respiratory symptoms  Endocrine: Negative for endocrine symptoms  Musculoskeletal: Negative for musculoskeletal symptoms  Neurological: Negative for neurological symptoms  Psychologic: Negative for psychiatric symptoms

## 2020-12-07 NOTE — Patient Instructions (Signed)
Advice for Weight Management  -For most of us the best way to lose weight is by diet management. Generally speaking, diet management means consuming less calories intentionally which over time brings about progressive weight loss.  This can be achieved more effectively by restricting carbohydrate consumption to the minimum possible.  So, it is critically important to know your numbers: how much calorie you are consuming and how much calorie you need. More importantly, our carbohydrates sources should be unprocessed or minimally processed complex starch food items.   Sometimes, it is important to balance nutrition by increasing protein intake (animal or plant source), fruits, and vegetables.  -Sticking to a routine mealtime to eat 3 meals a day and avoiding unnecessary snacks is shown to have a big role in weight control. Under normal circumstances, the only time we lose real weight is when we are hungry, so allow hunger to take place- hunger means no food between meal times, only water.  It is not advisable to starve.   -It is better to avoid simple carbohydrates including: Cakes, Sweet Desserts, Ice Cream, Soda (diet and regular), Sweet Tea, Candies, Chips, Cookies, Store Bought Juices, Alcohol in Excess of  1-2 drinks a day, Artificial Sweeteners, Doughnuts, Coffee Creamers, "Sugar-free" Products, etc, etc.  This is not a complete list.....    -Consulting with certified diabetes educators is proven to provide you with the most accurate and current information on diet.  Also, you may be  interested in discussing diet options/exchanges , we can schedule a visit with Peter Dunlap, RDN, CDE for individualized nutrition education.  -Exercise: If you are able: 30 -60 minutes a day ,4 days a week, or 150 minutes a week.  The longer the better.  Combine stretch, strength, and aerobic activities.  If you were told in the past that you have high risk for cardiovascular diseases, you may seek evaluation by  your heart doctor prior to initiating moderate to intense exercise programs.    

## 2020-12-07 NOTE — Progress Notes (Signed)
AppearHistory of Present Illness: Here for follow-up of prostate cancer and BPH.     Prostate cancer:  He underwent TRUS/Bx on 8.15.2017. At that time, PSA was 9.09. Prostatic volume was 25 cc. 10/12 cores came back positive for adenocarcinoma as follows: 1 core revealed GS 3+3 5 cores revealed GS 3+4 4 cores revealed GS 4+3   He completed IMRT, 40 sessions, on 1.10.2018   5.12.2020: PSA 0.7, up slightly.   9.15.2020: PSA 0.8.   8.30940: PSA 1.4  9.7.2021: He is here to discuss his biopsy results.  1/12 cores-from the left mid lateral region-came back positive for adenocarcinoma.  Grading was difficult due to his prior radiation.  He has done fairly well since his biopsy.  10.15.2021: -CTAP  with newly enlarged left iliac lymph nodes measuring 1.1 x 0.9 cm.  Hepatic steatosis with early signs of cirrhosis.  04/12/2020 -F-18 PSMA PET scan on showed radiotracer activity associated with enlarged left external iliac lymph node 9 mm with SUV 7.6.  Small left common iliac lymph node 6 mm, SUV 8.6.  Left periaortic lymph node 5 mm, SUV 6.3.  Lymph node between IVC and aorta at the level of right renal hilum, SUV 8.6.  Lymph node deep to the IVC measuring 6 mm, SUV 5.4.  No skeletal metastasis.  11.17.2021: Began androgen deprivation therapy with [place].  7.5.2022: He has been followed on a regular basis by Dr. Delton Coombes. He is on [place] on a monthly basis.  He is also on [place].  Most recent PSA is less than 0.1.  Testosterone level 5.  BPH Patient is currently treated with Rapaflo for his symptoms.   4.16.2019: He still has moderate voiding symptomatology despite being on Rapaflo. No gross hematuria or dysuria, however. IPSS 25 Rapaflo increased to BID.   10.22.2019: Current IPSS 27 QOl score 2--only on 1 Rapaflo a day   1.27.2020: The patient is still having significant lower urinary tract symptoms. Rapaflo increased to Q 12 hrs   5.12.2020: IPSS is 21. Quality of  life score 2. He is on generic Rapaflo twice a day.   8.3.2021: Pt reports recent/persistent gross hematuria - beginning of stream but notes that his FOS is strong. Pt reports no other significant concerns at this time.  7.5.2022: He is still having some bothersome urination.  IPSS 23, quality-of-life score 3.  He is on silodosin twice a day.  Residual urine volume today 7 mL.     Past Medical History:  Diagnosis Date   Arteriosclerotic cardiovascular disease (ASCVD) 2005   catheterization in 10/2010:50% mid LAD, diffuse distal disease, circumflex irregularities, large dominant RCA with a 50% ostial, 70% distal, 60% posterolateral and 70% PDA; normal EF   Arthritis    Benign prostatic hypertrophy    Bilateral carpal tunnel syndrome 07/03/2018   Cerebrovascular disease 2010   R. carotid endarterectomy; Duplex in 10/2010-widely patent ICAs, subtotal left vertebral-not thought to be contributing to symptoms   Cervical spine disease    CT in 2012-advanced degeneration and spondylosis with moderate spinal stenosis at C3-C6   CHF (congestive heart failure) ([place])    Depression    Diabetes mellitus without complication ([place])    Erectile dysfunction    Family history of breast cancer    Family history of cancer of mouth    Family history of CML (chronic monocytic leukemia)    Family history of lung cancer    Family history of ovarian cancer    Family history of stomach  cancer    Gastroesophageal reflux disease    H/O hiatal hernia    H/O: substance abuse ([place])    Cocaine, marijuana, alcohol.  Quit 2013.    Hyperlipidemia    Hypertension    Non-ST elevation myocardial infarction (NSTEMI), initial episode of care ([place]) 12/02/2013   DES LAD   Obesity    Prostate cancer ([place])    Sleep apnea    CPAP   Tachy-brady syndrome ([place])    a. 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 25, 2010    CEA   CORONARY ANGIOPLASTY WITH STENT PLACEMENT  12/03/2013   LAD 90%-->0% W/ Promus Premier DES 3.0 mm x 16 mm, CFX OK, RCA 40%, EF 70-75%   LEFT ATRIAL APPENDAGE OCCLUSION N/A 08/05/2015   Procedure: LEFT ATRIAL APPENDAGE OCCLUSION;  Surgeon: [name], MD;  Location: [place] CV LAB;  Service: Cardiovascular;  Laterality: N/A;   LEFT HEART CATHETERIZATION WITH CORONARY ANGIOGRAM Left 12/03/2013   Procedure: LEFT HEART CATHETERIZATION WITH CORONARY ANGIOGRAM;  Surgeon: Leonie Man, MD;  Location[facility] C[facility] LAB;  Service: Cardiovascular;  Laterality: Left;   LEFT HEART CATHETERIZATION WITH CORONARY ANGIOGRAM N/A 01/26/2014   Procedure: LEFT HEART CATHETERIZATION WITH CORONARY ANGIOGRAM;  Surgeon: Jettie Booze, MD;  Location: [facility] CATH LAB;  Service: Cardiovascular;  Laterality: N/A;   LEFT HEART CATHETERIZATION WITH CORONARY ANGIOGRAM N/A 08/03/2014   Procedure: LEFT HEART CATHETERIZATION WITH CORONARY ANGIOGRAM;  Surgeon: Burnell Blanks, MD;  Location: [facility] CATH LAB;  Service: Cardiovascular;  Laterality: N/A;   PERCUTANEOUS CORONARY STENT INTERVENTION (PCI-S)  12/03/2013   Procedure: PERCUTANEOUS CORONARY STENT INTERVENTION (PCI-S);  Surgeon: Leonie Man, MD;  Location: [facility] CATH LAB;  Service: Cardiovascular;;   PERMANENT PACEMAKER INSERTION N/A 09/18/2014   Procedure: PERMANENT PACEMAKER INSERTION;  Surgeon: [name], MD;  Location: [facility] CATH LAB;  Service: Cardiovascular;  Laterality: N/A;   RADIOFREQUENCY ABLATION  2005   for PSVT   TEE WITHOUT CARDIOVERSION N/A 07/27/2015   Procedure: TRANSESOPHAGEAL ECHOCARDIOGRAM (TEE);  Surgeon: Lelon Perla, MD;  Location: [facility] ENDOSCOPY;  Service: Cardiovascular;   Laterality: N/A;   TEE WITHOUT CARDIOVERSION N/A 09/15/2015   Procedure: TRANSESOPHAGEAL ECHOCARDIOGRAM (TEE);  Surgeon: Thayer Headings, MD;  Location: [place];  Service: Cardiovascular;  Laterality: N/A;    Home Medications:  Allergies as of 12/07/2020       Reactions   Trazodone And Nefazodone    Nightmares   Lactose Intolerance (gi) Other (See Comments)   UPSET STOMACH        Medication List        Accurate as of December 07, 2020 12:22 PM. If you have any questions, ask your nurse or doctor.          allopurinol 100 MG tablet Commonly known as: ZYLOPRIM TAKE 1 TABLET(100 MG) BY MOUTH DAILY   aspirin EC 325 MG tablet Take 1 tablet (325 mg total) daily by mouth.   Blood Glucose System Pak Kit Use as directed to monitor FSBS 1x daily. Dx: E11.9.  colchicine 0.6 MG tablet   Dexilant 60 MG capsule Generic drug: dexlansoprazole TAKE 1 CAPSULE(60 MG) BY MOUTH DAILY   Erleada 60 MG tablet Generic drug: apalutamide TAKE 4 TABLETS (240 MG TOTAL) BY MOUTH DAILY. MAY BE TAKEN WITH OR WITHOUT FOOD. SWALLOW TABLETS WHOLE.   fluticasone 50 MCG/ACT nasal spray Commonly known as: FLONASE Place 2 sprays into both nostrils daily.   furosemide 20 MG tablet Commonly known as: LASIX TAKE 3 TABLETS BY MOUTH DAILY   glipiZIDE 5 MG 24 hr tablet Commonly known as: GLUCOTROL XL TAKE 1 TABLET(5 MG) BY MOUTH DAILY WITH BREAKFAST   isosorbide mononitrate 60 MG 24 hr tablet Commonly known as: IMDUR TAKE 1 TABLET BY MOUTH DAILY   levothyroxine 200 MCG tablet Commonly known as: SYNTHROID TAKE 1 TABLET(200 MCG) BY MOUTH DAILY BEFORE AND BREAKFAST   loratadine 10 MG tablet Commonly known as: CLARITIN TAKE 1 TABLET(10 MG) BY MOUTH DAILY AS NEEDED FOR ALLERGIES   magnesium oxide 400 (240 Mg) MG tablet Commonly known as: MAG-OX TAKE 1 TABLET(400 MG) BY MOUTH TWICE DAILY   metFORMIN 500 MG tablet Commonly known as: GLUCOPHAGE TAKE 1 TABLET BY MOUTH TWICE DAILY    nitroGLYCERIN 0.4 MG SL tablet Commonly known as: NITROSTAT PLACE 1 TABLET UNDER THE TONGUE EVERY 5 MINUTES AS NEEDED FOR CHEST PAIN. CALL 911 AT THIRD DOSE IN 15 MINUTES   OneTouch Ultra test strip Generic drug: glucose blood USE TO TEST ONCE DAILY   oxyCODONE-acetaminophen 10-325 MG tablet Commonly known as: PERCOCET Take 1 tablet by mouth every 8 (eight) hours as needed for pain.   pantoprazole 40 MG tablet Commonly known as: PROTONIX TAKE 1 TABLET(40 MG) BY MOUTH DAILY   silodosin 8 MG Caps capsule Commonly known as: RAPAFLO Take 1 capsule (8 mg total) by mouth 2 (two) times daily.   SIMVASTATIN PO   simvastatin 40 MG tablet Commonly known as: ZOCOR TAKE 1 TABLET(40 MG) BY MOUTH DAILY   sotalol 80 MG tablet Commonly known as: BETAPACE Take 2 tablets (160 mg) in the AM and 1 tablet (80 mg) in the PM   Stool Softener 100 MG capsule Generic drug: docusate sodium Take 100 mg by mouth 2 (two) times daily.   Vitamin B 12 500 MCG Tabs Take by mouth.   Vitamin D (Ergocalciferol) 1.25 MG (50000 UNIT) Caps capsule Commonly known as: DRISDOL TAKE 1 CAPSULE BY MOUTH EVERY 7 DAYS        Allergies:  Allergies  Allergen Reactions   Trazodone And Nefazodone     Nightmares   Lactose Intolerance (Gi) Other (See Comments)    UPSET STOMACH     Family History  Problem Relation Age of Onset   Hypertension Mother        Cerebrovascular disease   Diabetes Mother    Coronary artery disease Father 70   Diabetes type II Father    Hypertension Father    Heart attack Father    Diabetes Brother    Hypertension Brother    Diabetes Sister    Hypertension Sister    Heart attack Sister 8   Leukemia Sister 33       CML   Breast cancer Maternal Grandmother        dx 5s   Lung cancer Maternal Uncle        dx >50, smoker   Cancer Cousin        mouth cancer, dx 75s, no smoking/chew tobacco hx (maternal 1st cousin)   Ovarian  cancer Cousin        dx <50 (maternal 1st  cousin)   Stomach cancer Cousin        dx 34s (maternal 1st cousin)   Cancer Cousin        type unknown to pt, dx >50 (paternal 1st cousin)    Social History:  reports that he quit smoking about 8 years ago. His smoking use included cigarettes. He started smoking about 48 years ago. He has a 40.00 pack-year smoking history. He has never used smokeless tobacco. He reports current alcohol use. He reports that he does not use drugs.  ROS: A complete review of systems was performed.  All systems are negative except for pertinent findings as noted.  Physical Exam:  Vital signs in last 24 hours: There were no vitals taken for this visit. 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  BPH, he is emptying out well but still fairly symptomatic  Plan:  I gave him an overactive bladder guide sheet.  We will continue him on the same dose of silodosin  I recommend that he ask Dr. Delton Coombes whether he can be put on leuprolide every 4 months as he is having painful injections from the Firmagon  I will see back in 4 months

## 2020-12-08 ENCOUNTER — Other Ambulatory Visit: Payer: Self-pay | Admitting: Nurse Practitioner

## 2020-12-08 ENCOUNTER — Other Ambulatory Visit: Payer: Self-pay | Admitting: Internal Medicine

## 2020-12-08 DIAGNOSIS — R0600 Dyspnea, unspecified: Secondary | ICD-10-CM

## 2020-12-08 DIAGNOSIS — M10071 Idiopathic gout, right ankle and foot: Secondary | ICD-10-CM

## 2020-12-08 DIAGNOSIS — I209 Angina pectoris, unspecified: Secondary | ICD-10-CM

## 2020-12-08 DIAGNOSIS — R634 Abnormal weight loss: Secondary | ICD-10-CM

## 2020-12-08 MED ORDER — ISOSORBIDE MONONITRATE ER 60 MG PO TB24: 60.0000 mg | ORAL_TABLET | Freq: Every day | ORAL | 2 refills | Status: DC

## 2020-12-13 ENCOUNTER — Other Ambulatory Visit (HOSPITAL_COMMUNITY): Payer: Self-pay

## 2020-12-20 ENCOUNTER — Other Ambulatory Visit (HOSPITAL_COMMUNITY): Payer: Self-pay

## 2020-12-25 ENCOUNTER — Other Ambulatory Visit: Payer: Self-pay | Admitting: Nurse Practitioner

## 2020-12-25 DIAGNOSIS — H938X1 Other specified disorders of right ear: Secondary | ICD-10-CM

## 2020-12-29 NOTE — Progress Notes (Signed)
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 A variant of uncertain significance (VUS) was detected in the NF1 gene called c.1178A>G. The report date is 05/23/2020.  The Common Hereditary Cancers Panel offered by [facility] includes sequencing and/or deletion duplication testing of the following 47 genes: APC, ATM, AXIN2, BARD1, BMPR1A, BRCA1, BRCA2, BRIP1, CDH1, CDK4, CDKN2A (p14ARF), CDKN2A (p16INK4a), CHEK2, CTNNA1, DICER1, EPCAM (Deletion/duplication testing only), GREM1 (promoter region deletion/duplication testing only), KIT, MEN1, MLH1, MSH2, MSH3, MSH6, MUTYH, NBN, NF1, NTHL1, PALB2, PDGFRA, PMS2, POLD1, POLE, PTEN, RAD50, RAD51C, RAD51D, SDHB, SDHC, SDHD, SMAD4, SMARCA4. STK11, TP53, TSC1, TSC2, and VHL.  The following genes were evaluated for sequence changes only: SDHA and HOXB13 c.251G>A variant only. The Prostate Cancer HRR Panel offered by [facility] includes sequencing and/or deletion duplication analysis of the following 10 genes: ATM, BARD1, BRCA1, BRCA2, BRIP1, CHEK2, FANCL, PALB2, RAD51C, RAD51D.     CANCER STAGING: Cancer Staging No matching staging information was found for the patient.  INTERVAL HISTORY:  [name], a 65 y.o. male, returns for routine follow-up of his metastatic castration sensitive  prostate cancer. Peter Dunlap was last seen on 10/07/20.   Today he reports feeling well. He reports 3 previous MI, but denies history of strokes. He denies hot flashes, n/v, ankle swellings, and reduced appetite, but reports diarrhea 1-2 times per month which lasts for several days which does not see, to correlate to his diet. The diarrhea is helped with imodium and lomotil.     REVIEW OF SYSTEMS:  Review of Systems  Constitutional:  Positive for fatigue (70%). Negative for appetite change (80%).  HENT:   Positive for trouble swallowing (d/t  teeth).   Cardiovascular:  Negative for leg swelling.  Gastrointestinal:  Positive for constipation and diarrhea. Negative for nausea and vomiting.  Endocrine: Negative for hot flashes.  Genitourinary:  Positive for difficulty urinating and nocturia.   Musculoskeletal:  Positive for back pain (9/10).  Neurological:  Positive for dizziness and numbness (arms).  All other systems reviewed and are negative.  PAST MEDICAL/SURGICAL HISTORY:  Past Medical History:  Diagnosis Date   Arteriosclerotic cardiovascular disease (ASCVD) 2005   catheterization in 10/2010:50% mid LAD, diffuse distal disease, circumflex irregularities, large dominant RCA with a 50% ostial, 70% distal, 60% posterolateral and 70% PDA; normal EF   Arthritis    Benign prostatic hypertrophy    Bilateral carpal tunnel syndrome 07/03/2018   Cerebrovascular disease 2010   R. carotid endarterectomy; Duplex in 10/2010-widely patent ICAs, subtotal left vertebral-not thought to be contributing to symptoms   Cervical spine disease    CT in 2012-advanced degeneration and spondylosis with moderate spinal stenosis at C3-C6   CHF (congestive heart failure) ([place])    Depression    Diabetes mellitus without complication ([place])    Erectile dysfunction    Family history of breast cancer  Family history of cancer of mouth    Family history of CML (chronic monocytic leukemia)    Family history of lung  cancer    Family history of ovarian cancer    Family history of stomach cancer    Gastroesophageal reflux disease    H/O hiatal hernia    H/O: substance abuse (HCC)    Cocaine, marijuana, alcohol.  Quit 2013.    Hyperlipidemia    Hypertension    Non-ST elevation myocardial infarction (NSTEMI), initial episode of care (HCC) 12/02/2013   DES LAD   Obesity    Prostate cancer (HCC)    Sleep apnea    CPAP   Tachy-brady syndrome (HCC)    a. 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 25, 2010    CEA   CORONARY ANGIOPLASTY WITH STENT PLACEMENT  12/03/2013   LAD 90%-->0% W/ Promus Premier DES 3.0 mm x 16 mm, CFX OK, RCA 40%, EF 70-75%   LEFT ATRIAL APPENDAGE OCCLUSION N/A 08/05/2015   Procedure: LEFT ATRIAL APPENDAGE OCCLUSION;  Surgeon: Peter Range, Dunlap;  Location: [facility] INVASIVE CV LAB;  Service: Cardiovascular;  Laterality: N/A;   LEFT HEART CATHETERIZATION WITH CORONARY ANGIOGRAM Left 12/03/2013   Procedure: LEFT HEART CATHETERIZATION WITH CORONARY ANGIOGRAM;  Surgeon: Peter Lex, Dunlap;  Location: [facility] CATH LAB;  Service: Cardiovascular;  Laterality: Left;   LEFT HEART CATHETERIZATION WITH CORONARY ANGIOGRAM N/A 01/26/2014   Procedure: LEFT HEART CATHETERIZATION WITH CORONARY ANGIOGRAM;  Surgeon: Peter Crafts, Dunlap;  Location: [facility] CATH LAB;  Service: Cardiovascular;  Laterality: N/A;   LEFT HEART CATHETERIZATION WITH CORONARY ANGIOGRAM N/A 08/03/2014   Procedure: LEFT HEART CATHETERIZATION WITH CORONARY ANGIOGRAM;  Surgeon: Peter Hazel, Dunlap;  Location[facility] C[facility] LAB;  Service: Cardiovascular;  Laterality: N/A;    PERCUTANEOUS CORONARY STENT INTERVENTION (PCI-S)  12/03/2013   Procedure: PERCUTANEOUS CORONARY STENT INTERVENTION (PCI-S);  Surgeon: Peter Lex, Dunlap;  Location: [facility] CATH LAB;  Service: Cardiovascular;;   PERMANENT PACEMAKER INSERTION N/A 09/18/2014   Procedure: PERMANENT PACEMAKER INSERTION;  Surgeon: Marinus Maw, Dunlap;  [facility] LAB;  Service: Cardiovascular;  Laterality: N/A;   RADIOFREQUENCY ABLATION  2005   for PSVT   TEE WITHOUT CARDIOVERSION N/A 07/27/2015   Procedure: TRANSESOPHAGEAL ECHOCARDIOGRAM (TEE);  Surgeon: Lewayne Bunting, Dunlap;  Location: [facility] ENDOSCOPY;  Service: Cardiovascular;  Laterality: N/A;   TEE WITHOUT CARDIOVERSION N/A 09/15/2015   Procedure: TRANSESOPHAGEAL ECHOCARDIOGRAM (TEE);  Surgeon: Vesta Mixer, Dunlap;  Location: [facility] ENDOSCOPY;  Service: Cardiovascular;  Laterality: N/A;    SOCIAL HISTORY:  Social History   Socioeconomic History   Marital status: Married    Spouse name: Not on file   Number of children: 0   Years of education: Not on file   Highest education level: Not on file  Occupational History   Occupation: Retired  Tobacco Use   Smoking status: Former    Packs/day: 1.00    Years: 40.00    Pack years: 40.00    Types: Cigarettes    Start date: 10/20/1972  Quit date: 10/10/2012    Years since quitting: 8.2   Smokeless tobacco: Never   Tobacco comments:    Quit in May.   Vaping Use   Vaping Use: Never used  Substance and Sexual Activity   Alcohol use: Yes    Alcohol/week: 0.0 standard drinks    Comment: former drinker-- sober since 2013.    Drug use: No    Types: Cocaine    Comment: quit cocaine 10/2011   Sexual activity: Yes    Partners: Female  Other Topics Concern   Not on file  Social History Narrative   Lives in [place].   Social Determinants of Health   Financial Resource Strain: Low Risk    Difficulty of Paying Living Expenses: Not hard at all  Food Insecurity: No Food Insecurity   Worried About Sales executive in the Last Year: Never true   [place] in the Last Year: Never true  Transportation Needs: No Transportation Needs   Lack of Transportation (Medical): No   Lack of Transportation (Non-Medical): No  Physical Activity: Inactive   Days of Exercise per Week: 0 days   Minutes of Exercise per Session: 0 min  Stress: No Stress Concern Present   Feeling of Stress : Not at all  Social Connections: Moderately Isolated   Frequency of Communication with Friends and Family: More than three times a week   Frequency of Social Gatherings with Friends and Family: Once a week   Attends Religious Services: Never   Marine scientist or Organizations: No   Attends Music therapist: Never   Marital Status: Married  Human resources officer Violence: Not At Risk   Fear of Current or Ex-Partner: No   Emotionally Abused: No   Physically Abused: No   Sexually Abused: No    FAMILY HISTORY:  Family History  Problem Relation Age of Onset   Hypertension Mother        Cerebrovascular disease   Diabetes Mother    Coronary artery disease Father 4   Diabetes type II Father    Hypertension Father    Heart attack Father    Diabetes Brother    Hypertension Brother    Diabetes Sister    Hypertension Sister    Heart attack Sister 69   Leukemia Sister 60       CML   Breast cancer Maternal Grandmother        dx 29s   Lung cancer Maternal Uncle        dx >50, smoker   Cancer Cousin        mouth cancer, dx 21s, no smoking/chew tobacco hx (maternal 1st cousin)   Ovarian cancer Cousin        dx <50 (maternal 1st cousin)   Stomach cancer Cousin        dx 59s (maternal 1st cousin)   Cancer Cousin        type unknown to pt, dx >50 (paternal 1st cousin)    CURRENT MEDICATIONS:  Current Outpatient Medications  Medication Sig Dispense Refill   allopurinol (ZYLOPRIM) 100 MG tablet TAKE 1 TABLET(100 MG) BY MOUTH DAILY 90 tablet 0   apalutamide (ERLEADA) 60 MG tablet TAKE 4 TABLETS (240  MG TOTAL) BY MOUTH DAILY. MAY BE TAKEN WITH OR WITHOUT FOOD. SWALLOW TABLETS WHOLE. 120 tablet 3   aspirin EC 325 MG tablet Take 1 tablet (325 mg total) daily by mouth. 30 tablet 0   Blood Glucose Monitoring  Suppl (BLOOD GLUCOSE SYSTEM PAK) KIT Use as directed to monitor FSBS 1x daily. Dx: E11.9. 1 kit 1   colchicine 0.6 MG tablet      Cyanocobalamin (VITAMIN B 12) 500 MCG TABS Take by mouth.     DEXILANT 60 MG capsule TAKE 1 CAPSULE(60 MG) BY MOUTH DAILY 30 capsule 5   diphenoxylate-atropine (LOMOTIL) 2.5-0.025 MG tablet Take 1 tablet by mouth every 6 (six) hours as needed.     docusate sodium (COLACE) 100 MG capsule Take 100 mg by mouth 2 (two) times daily.     fluticasone (FLONASE) 50 MCG/ACT nasal spray Place 2 sprays into both nostrils daily. 16 g 6   furosemide (LASIX) 20 MG tablet TAKE 3 TABLETS BY MOUTH DAILY 270 tablet 3   glipiZIDE (GLUCOTROL XL) 5 MG 24 hr tablet TAKE 1 TABLET(5 MG) BY MOUTH DAILY WITH BREAKFAST 90 tablet 0   isosorbide mononitrate (IMDUR) 60 MG 24 hr tablet Take 1 tablet (60 mg total) by mouth daily. 90 tablet 2   levothyroxine (SYNTHROID) 200 MCG tablet TAKE 1 TABLET(200 MCG) BY MOUTH DAILY BEFORE AND BREAKFAST 90 tablet 0   loratadine (CLARITIN) 10 MG tablet TAKE 1 TABLET(10 MG) BY MOUTH DAILY AS NEEDED FOR ALLERGIES 30 tablet 2   Magnesium Oxide 400 (240 Mg) MG TABS TAKE 1 TABLET(400 MG) BY MOUTH TWICE DAILY 180 tablet 1   metFORMIN (GLUCOPHAGE) 500 MG tablet TAKE 1 TABLET BY MOUTH TWICE DAILY 180 tablet 0   ONETOUCH ULTRA test strip USE TO TEST ONCE DAILY 100 strip 1   oxyCODONE-acetaminophen (PERCOCET) 10-325 MG tablet Take 1 tablet by mouth every 8 (eight) hours as needed for pain. 90 tablet 0   pantoprazole (PROTONIX) 40 MG tablet TAKE 1 TABLET(40 MG) BY MOUTH DAILY 30 tablet 3   silodosin (RAPAFLO) 8 MG CAPS capsule Take 1 capsule (8 mg total) by mouth 2 (two) times daily. 60 capsule 11   simvastatin (ZOCOR) 40 MG tablet TAKE 1 TABLET(40 MG) BY MOUTH DAILY 90  tablet 1   SIMVASTATIN PO      sotalol (BETAPACE) 160 MG tablet Take 160 mg by mouth 2 (two) times daily.     sotalol (BETAPACE) 80 MG tablet Take 2 tablets (160 mg) in the AM and 1 tablet (80 mg) in the PM 135 tablet 3   Vitamin D, Ergocalciferol, (DRISDOL) 1.25 MG (50000 UNIT) CAPS capsule TAKE 1 CAPSULE BY MOUTH EVERY 7 DAYS 12 capsule 0   nitroGLYCERIN (NITROSTAT) 0.4 MG SL tablet PLACE 1 TABLET UNDER THE TONGUE EVERY 5 MINUTES AS NEEDED FOR CHEST PAIN. CALL 911 AT THIRD DOSE IN 15 MINUTES (Patient not taking: Reported on 12/30/2020) 25 tablet 1   No current facility-administered medications for this visit.    ALLERGIES:  Allergies  Allergen Reactions   Trazodone And Nefazodone     Nightmares   Lactose Intolerance (Gi) Other (See Comments)    UPSET STOMACH     PHYSICAL EXAM:  Performance status (ECOG): 1 - Symptomatic but completely ambulatory  Vitals:   12/30/20 1406  BP: 102/66  Pulse: 65  Resp: 19  Temp: 97.9 F (36.6 C)  SpO2: 96%   Wt Readings from Last 3 Encounters:  12/30/20 (!) 311 lb 9.6 oz (141.3 kg)  12/07/20 (!) 315 lb (142.9 kg)  12/02/20 (!) 315 lb 1.6 oz (142.9 kg)   Physical Exam Vitals reviewed.  Constitutional:      Appearance: Normal appearance. He is obese.  Cardiovascular:  Rate and Rhythm: Normal rate and regular rhythm.     Pulses: Normal pulses.     Heart sounds: Normal heart sounds.  Pulmonary:     Effort: Pulmonary effort is normal.     Breath sounds: Normal breath sounds.  Neurological:     General: No focal deficit present.     Mental Status: He is alert and oriented to person, place, and time.  Psychiatric:        Mood and Affect: Mood normal.        Behavior: Behavior normal.     LABORATORY DATA:  I have reviewed the labs as listed.  CBC Latest Ref Rng & Units 12/30/2020 11/25/2020 11/08/2020  WBC 4.0 - 10.5 K/uL 6.7 6.6 7.6  Hemoglobin 13.0 - 17.0 g/dL 11.9(L) 11.8(L) 12.5(L)  Hematocrit 39.0 - 52.0 % 35.5(L) 34.2(L)  36.7(L)  Platelets 150 - 400 K/uL 121(L) 113(L) 150   CMP Latest Ref Rng & Units 12/30/2020 11/25/2020 11/08/2020  Glucose 70 - 99 mg/dL 91 129(H) 150(H)  BUN 8 - 23 mg/dL $Remove'10 12 15  'aDOboNR$ Creatinine 0.61 - 1.24 mg/dL 1.43(H) 1.41(H) 1.34(H)  Sodium 135 - 145 mmol/L 139 137 134(L)  Potassium 3.5 - 5.1 mmol/L 3.7 3.8 3.9  Chloride 98 - 111 mmol/L 107 107 106  CO2 22 - 32 mmol/L 24 22 21(L)  Calcium 8.9 - 10.3 mg/dL 9.5 8.8(L) 9.0  Total Protein 6.5 - 8.1 g/dL 7.7 7.2 8.1  Total Bilirubin 0.3 - 1.2 mg/dL 0.8 0.5 0.7  Alkaline Phos 38 - 126 U/L 78 83 100  AST 15 - 41 U/L 26 96(H) 29  ALT 0 - 44 U/L $Remo'13 19 16    'gOjcO$ DIAGNOSTIC IMAGING:  I have independently reviewed the scans and discussed with the patient. No results found.   ASSESSMENT:  1.  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 -Status post IMRT, 40 sessions completed on 06/14/2016 by Dr. Tammi Klippel. -PSA 0.7 (10/15/2018), 0.8 (02/18/2019), 1.4 (December 2020), 1.4 (June 2021) -Prostate biopsy on 02/10/2020-1 out of 12 cores from left mid lateral region positive for adenocarcinoma. -CTAP on 03/09/2020 with newly enlarged left iliac lymph nodes measuring 1.1 x 0.9 cm.  Hepatic steatosis with early signs of cirrhosis. -F-18 PSMA PET scan on 04/12/2020 showed radiotracer activity associated with enlarged left external iliac lymph node 9 mm with SUV 7.6.  Small left common iliac lymph node 6 mm, SUV 8.6.  Left periaortic lymph node 5 mm, SUV 6.3.  Lymph node between IVC and aorta at the level of right renal hilum, SUV 8.6.  Lymph node deep to the IVC measuring 6 mm, SUV 5.4.  No skeletal metastasis. 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 - Firmagon started on 04/21/2020 - Apalutamide 240 milligrams daily started around 04/22/2020.   2.  Social/family history: -He is a retired  Nature conservation officer.  He lives at home with his wife.  He plays golf.  He quit smoking in 2015, smoked 1 pack/day for more than 20 years. -Sister has CML.  Maternal grandmother had breast cancer.  Maternal uncle had lung cancer.  2 maternal cousins had tongue cancer and uterine cancer.   3.  Cirrhosis: -Prior imaging showed cirrhosis of the liver with normal spleen.   PLAN:  1.  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  Denies any  major hot flashes.  Has occasional diarrhea once per month which is controlled with Lomotil.  Has very minimal hot flashes. - Reviewed his labs today which showed normal LFTs.  Mild CKD with creatinine 1.43 stable.  Last PSA was undetectable on 11/25/2020. - He reports pain at the abdominal injection site of Firmagon which lasts about 4 to 5 days. - We have discussed other option of LHRH agonist Lupron.  There is conflicting data showing degarelix may be associated with a lower cardiovascular risk profile than Lupron.  He had 3 heart attacks, stent placement and pacemaker.  He never had stroke. - When I discussed the possible cardiovascular side effects, he wanted to stay with [facility] for now. - Continue apalutamide.  RTC 3 months for follow-up.   2.  Poorly controlled diabetes: - Blood sugar today improved to 91. - Continue Glucotrol XL daily.  He is taking metformin once daily because of diarrhea.   3.  Mild thrombocytopenia: - Mild thrombocytopenia, on and off since December 2020 has been stable.  Today platelet count is 120.  No bleeding issues reported.   4.  Hypothyroidism: - His last TSH on 11/30/2020 was 59. - Continue Synthroid 200 mcg daily.   Orders placed this encounter:  No orders of the defined types were placed in this encounter.    Derek Jack, Dunlap [place] [phone]   I, Thana Ates, am acting as a scribe for Dr.  Derek Jack.  I, Derek Jack Dunlap, have reviewed the above documentation for accuracy and completeness, and I agree with the above.

## 2020-12-30 ENCOUNTER — Inpatient Hospital Stay (HOSPITAL_COMMUNITY): Payer: Medicare Other

## 2020-12-30 ENCOUNTER — Other Ambulatory Visit: Payer: Self-pay

## 2020-12-30 ENCOUNTER — Inpatient Hospital Stay (HOSPITAL_COMMUNITY): Payer: Medicare Other | Attending: Hematology | Admitting: Hematology

## 2020-12-30 VITALS — BP 102/66 | HR 65 | Temp 97.9°F | Resp 19 | Wt 311.6 lb

## 2020-12-30 DIAGNOSIS — E785 Hyperlipidemia, unspecified: Secondary | ICD-10-CM | POA: Insufficient documentation

## 2020-12-30 DIAGNOSIS — R351 Nocturia: Secondary | ICD-10-CM | POA: Insufficient documentation

## 2020-12-30 DIAGNOSIS — Z87891 Personal history of nicotine dependence: Secondary | ICD-10-CM | POA: Diagnosis not present

## 2020-12-30 DIAGNOSIS — E669 Obesity, unspecified: Secondary | ICD-10-CM | POA: Insufficient documentation

## 2020-12-30 DIAGNOSIS — I129 Hypertensive chronic kidney disease with stage 1 through stage 4 chronic kidney disease, or unspecified chronic kidney disease: Secondary | ICD-10-CM | POA: Diagnosis not present

## 2020-12-30 DIAGNOSIS — Z5111 Encounter for antineoplastic chemotherapy: Secondary | ICD-10-CM | POA: Diagnosis present

## 2020-12-30 DIAGNOSIS — C61 Malignant neoplasm of prostate: Secondary | ICD-10-CM

## 2020-12-30 DIAGNOSIS — R42 Dizziness and giddiness: Secondary | ICD-10-CM | POA: Diagnosis not present

## 2020-12-30 DIAGNOSIS — D696 Thrombocytopenia, unspecified: Secondary | ICD-10-CM | POA: Insufficient documentation

## 2020-12-30 DIAGNOSIS — C778 Secondary and unspecified malignant neoplasm of lymph nodes of multiple regions: Secondary | ICD-10-CM | POA: Insufficient documentation

## 2020-12-30 DIAGNOSIS — Z191 Hormone sensitive malignancy status: Secondary | ICD-10-CM

## 2020-12-30 DIAGNOSIS — E039 Hypothyroidism, unspecified: Secondary | ICD-10-CM | POA: Insufficient documentation

## 2020-12-30 DIAGNOSIS — K7460 Unspecified cirrhosis of liver: Secondary | ICD-10-CM | POA: Diagnosis not present

## 2020-12-30 DIAGNOSIS — Z801 Family history of malignant neoplasm of trachea, bronchus and lung: Secondary | ICD-10-CM | POA: Insufficient documentation

## 2020-12-30 DIAGNOSIS — R200 Anesthesia of skin: Secondary | ICD-10-CM | POA: Diagnosis not present

## 2020-12-30 DIAGNOSIS — Z7984 Long term (current) use of oral hypoglycemic drugs: Secondary | ICD-10-CM | POA: Diagnosis not present

## 2020-12-30 DIAGNOSIS — Z79899 Other long term (current) drug therapy: Secondary | ICD-10-CM | POA: Insufficient documentation

## 2020-12-30 DIAGNOSIS — M549 Dorsalgia, unspecified: Secondary | ICD-10-CM | POA: Diagnosis not present

## 2020-12-30 DIAGNOSIS — R197 Diarrhea, unspecified: Secondary | ICD-10-CM | POA: Insufficient documentation

## 2020-12-30 DIAGNOSIS — K760 Fatty (change of) liver, not elsewhere classified: Secondary | ICD-10-CM | POA: Diagnosis not present

## 2020-12-30 DIAGNOSIS — Z8041 Family history of malignant neoplasm of ovary: Secondary | ICD-10-CM | POA: Insufficient documentation

## 2020-12-30 DIAGNOSIS — Z8249 Family history of ischemic heart disease and other diseases of the circulatory system: Secondary | ICD-10-CM | POA: Insufficient documentation

## 2020-12-30 DIAGNOSIS — E1122 Type 2 diabetes mellitus with diabetic chronic kidney disease: Secondary | ICD-10-CM | POA: Diagnosis not present

## 2020-12-30 DIAGNOSIS — I252 Old myocardial infarction: Secondary | ICD-10-CM | POA: Diagnosis not present

## 2020-12-30 DIAGNOSIS — Z803 Family history of malignant neoplasm of breast: Secondary | ICD-10-CM | POA: Diagnosis not present

## 2020-12-30 DIAGNOSIS — N182 Chronic kidney disease, stage 2 (mild): Secondary | ICD-10-CM | POA: Insufficient documentation

## 2020-12-30 DIAGNOSIS — I2510 Atherosclerotic heart disease of native coronary artery without angina pectoris: Secondary | ICD-10-CM | POA: Insufficient documentation

## 2020-12-30 DIAGNOSIS — Z800 Family history of malignant neoplasm of digestive organs: Secondary | ICD-10-CM | POA: Insufficient documentation

## 2020-12-30 DIAGNOSIS — R5383 Other fatigue: Secondary | ICD-10-CM | POA: Insufficient documentation

## 2020-12-30 DIAGNOSIS — Z808 Family history of malignant neoplasm of other organs or systems: Secondary | ICD-10-CM | POA: Insufficient documentation

## 2020-12-30 DIAGNOSIS — Z806 Family history of leukemia: Secondary | ICD-10-CM | POA: Insufficient documentation

## 2020-12-30 DIAGNOSIS — Z833 Family history of diabetes mellitus: Secondary | ICD-10-CM | POA: Insufficient documentation

## 2020-12-30 LAB — CBC WITH DIFFERENTIAL/PLATELET
Abs Immature Granulocytes: 0.01 10*3/uL (ref 0.00–0.07)
Basophils Absolute: 0.1 10*3/uL (ref 0.0–0.1)
Basophils Relative: 1 %
Eosinophils Absolute: 0.2 10*3/uL (ref 0.0–0.5)
Eosinophils Relative: 3 %
HCT: 35.5 % — ABNORMAL LOW (ref 39.0–52.0)
Hemoglobin: 11.9 g/dL — ABNORMAL LOW (ref 13.0–17.0)
Immature Granulocytes: 0 %
Lymphocytes Relative: 36 %
Lymphs Abs: 2.4 10*3/uL (ref 0.7–4.0)
MCH: 35.3 pg — ABNORMAL HIGH (ref 26.0–34.0)
MCHC: 33.5 g/dL (ref 30.0–36.0)
MCV: 105.3 fL — ABNORMAL HIGH (ref 80.0–100.0)
Monocytes Absolute: 0.7 10*3/uL (ref 0.1–1.0)
Monocytes Relative: 10 %
Neutro Abs: 3.4 10*3/uL (ref 1.7–7.7)
Neutrophils Relative %: 50 %
Platelets: 121 10*3/uL — ABNORMAL LOW (ref 150–400)
RBC: 3.37 MIL/uL — ABNORMAL LOW (ref 4.22–5.81)
RDW: 15.2 % (ref 11.5–15.5)
WBC: 6.7 10*3/uL (ref 4.0–10.5)
nRBC: 0 % (ref 0.0–0.2)

## 2020-12-30 LAB — COMPREHENSIVE METABOLIC PANEL
ALT: 13 U/L (ref 0–44)
AST: 26 U/L (ref 15–41)
Albumin: 3.7 g/dL (ref 3.5–5.0)
Alkaline Phosphatase: 78 U/L (ref 38–126)
Anion gap: 8 (ref 5–15)
BUN: 10 mg/dL (ref 8–23)
CO2: 24 mmol/L (ref 22–32)
Calcium: 9.5 mg/dL (ref 8.9–10.3)
Chloride: 107 mmol/L (ref 98–111)
Creatinine, Ser: 1.43 mg/dL — ABNORMAL HIGH (ref 0.61–1.24)
GFR, Estimated: 54 mL/min — ABNORMAL LOW (ref >60)
Glucose, Bld: 91 mg/dL (ref 70–99)
Potassium: 3.7 mmol/L (ref 3.5–5.1)
Sodium: 139 mmol/L (ref 135–145)
Total Bilirubin: 0.8 mg/dL (ref 0.3–1.2)
Total Protein: 7.7 g/dL (ref 6.5–8.1)

## 2020-12-30 LAB — PSA: Prostatic Specific Antigen: <0.01 ng/mL (ref 0.00–4.00)

## 2020-12-30 MED ORDER — DEGARELIX ACETATE 80 MG ~~LOC~~ SOLR
80.0000 mg | Freq: Once | SUBCUTANEOUS | Status: AC
  Administered 2020-12-30: 80 mg via SUBCUTANEOUS
  Filled 2020-12-30: qty 4

## 2020-12-30 NOTE — Patient Instructions (Signed)
[facility]  Discharge Instructions: Thank you for choosing [facility] to provide your oncology and hematology care.  If you have a lab appointment with the [facility], please come in thru the Main Entrance and check in at the main information desk.  Wear comfortable clothing and clothing appropriate for easy access to any Portacath or PICC line.   We strive to give you quality time with your provider. You may need to reschedule your appointment if you arrive late (15 or more minutes).  Arriving late affects you and other patients whose appointments are after yours.  Also, if you miss three or more appointments without notifying the office, you may be dismissed from the clinic at the provider's discretion.      For prescription refill requests, have your pharmacy contact our office and allow 72 hours for refills to be completed.    Today you received the following chemotherapy and/or immunotherapy agents Mills Koller   To help prevent nausea and vomiting after your treatment, we encourage you to take your nausea medication as directed.  BELOW ARE SYMPTOMS THAT SHOULD BE REPORTED IMMEDIATELY: *FEVER GREATER THAN 100.4 F (38 C) OR HIGHER *CHILLS OR SWEATING *NAUSEA AND VOMITING THAT IS NOT CONTROLLED WITH YOUR NAUSEA MEDICATION *UNUSUAL SHORTNESS OF BREATH *UNUSUAL BRUISING OR BLEEDING *URINARY PROBLEMS (pain or burning when urinating, or frequent urination) *BOWEL PROBLEMS (unusual diarrhea, constipation, pain near the anus) TENDERNESS IN MOUTH AND THROAT WITH OR WITHOUT PRESENCE OF ULCERS (sore throat, sores in mouth, or a toothache) UNUSUAL RASH, SWELLING OR PAIN  UNUSUAL VAGINAL DISCHARGE OR ITCHING   Items with * indicate a potential emergency and should be followed up as soon as possible or go to the Emergency Department if any problems should occur.  Please show the CHEMOTHERAPY ALERT CARD or IMMUNOTHERAPY ALERT CARD at check-in to the Emergency Department  and triage nurse.  Should you have questions after your visit or need to cancel or reschedule your appointment, please contact [facility] [phone]  and follow the prompts.  Office hours are 8:00 a.m. to 4:30 p.m. Monday - Friday. Please note that voicemails left after 4:00 p.m. may not be returned until the following business day.  We are closed weekends and major holidays. You have access to a nurse at all times for urgent questions. Please call the main number to the clinic [phone] and follow the prompts.  For any non-urgent questions, you may also contact your provider using MyChart. We now offer e-Visits for anyone 65 and older to request care online for non-urgent symptoms. For details visit [email or web address].   Also download the MyChart app! Go to the app store, search "MyChart", open the app, select [facility], and log in with your MyChart username and password.  Due to Covid, a mask is required upon entering the hospital/clinic. If you do not have a mask, one will be given to you upon arrival. For doctor visits, patients may have 1 support person aged 65 or older with them. For treatment visits, patients cannot have anyone with them due to current Covid guidelines and our immunocompromised population.

## 2020-12-30 NOTE — Progress Notes (Signed)
Patient has been assessed and labs reviewed by Dr. Delton Coombes. Okay to proceed with [facility] treatment today per Dr. Delton Coombes. Primary RN and Pharmacy made aware.

## 2020-12-30 NOTE — Patient Instructions (Addendum)
[facility] at [facility] Discharge Instructions  You were seen today by Dr. Katragadda. He went over your recent results. Dr. Katragadda will see you back in 3 months for labs and follow up.   Thank you for choosing [facility] at [facility] to provide your oncology and hematology care.  To afford each patient quality time with our provider, please arrive at least 15 minutes before your scheduled appointment time.   If you have a lab appointment with the Cancer Center please come in thru the Main Entrance and check in at the main information desk  You need to re-schedule your appointment should you arrive 10 or more minutes late.  We strive to give you quality time with our providers, and arriving late affects you and other patients whose appointments are after yours.  Also, if you no show three or more times for appointments you may be dismissed from the clinic at the providers discretion.     Again, thank you for choosing [facility].  Our hope is that these requests will decrease the amount of time that you wait before being seen by our physicians.       _____________________________________________________________  Should you have questions after your visit to [facility], please contact our office at [phone] between the hours of 8:00 a.m. and 4:30 p.m.  Voicemails left after 4:00 p.m. will not be returned until the following business day.  For prescription refill requests, have your pharmacy contact our office and allow 72 hours.    Cancer Center Support Programs:   > Cancer Support Group  2nd Tuesday of the month 1pm-2pm, Journey Room   

## 2020-12-30 NOTE — Progress Notes (Signed)
Patient tolerated injection with no complaints voiced.  Site clean and dry with no bruising or swelling noted at site.  See MAR for details.  Band aid applied.  Patient stable during and after injection.  Vss with discharge and left in satisfactory condition with no s/s of distress noted.  

## 2020-12-31 LAB — TESTOSTERONE: Testosterone: 8 ng/dL — ABNORMAL LOW (ref 264–916)

## 2021-01-18 ENCOUNTER — Other Ambulatory Visit (HOSPITAL_COMMUNITY): Payer: Self-pay

## 2021-01-20 ENCOUNTER — Other Ambulatory Visit (HOSPITAL_COMMUNITY): Payer: Self-pay

## 2021-01-27 ENCOUNTER — Other Ambulatory Visit: Payer: Self-pay

## 2021-01-27 ENCOUNTER — Encounter (HOSPITAL_COMMUNITY): Payer: Self-pay

## 2021-01-27 ENCOUNTER — Inpatient Hospital Stay (HOSPITAL_COMMUNITY): Payer: Medicare Other | Attending: Hematology

## 2021-01-27 VITALS — BP 107/56 | HR 66 | Temp 98.0°F | Resp 19

## 2021-01-27 DIAGNOSIS — C778 Secondary and unspecified malignant neoplasm of lymph nodes of multiple regions: Secondary | ICD-10-CM | POA: Diagnosis not present

## 2021-01-27 DIAGNOSIS — I252 Old myocardial infarction: Secondary | ICD-10-CM | POA: Insufficient documentation

## 2021-01-27 DIAGNOSIS — Z803 Family history of malignant neoplasm of breast: Secondary | ICD-10-CM | POA: Insufficient documentation

## 2021-01-27 DIAGNOSIS — Z833 Family history of diabetes mellitus: Secondary | ICD-10-CM | POA: Insufficient documentation

## 2021-01-27 DIAGNOSIS — Z809 Family history of malignant neoplasm, unspecified: Secondary | ICD-10-CM | POA: Diagnosis not present

## 2021-01-27 DIAGNOSIS — Z87891 Personal history of nicotine dependence: Secondary | ICD-10-CM | POA: Diagnosis not present

## 2021-01-27 DIAGNOSIS — Z8041 Family history of malignant neoplasm of ovary: Secondary | ICD-10-CM | POA: Diagnosis not present

## 2021-01-27 DIAGNOSIS — Z8249 Family history of ischemic heart disease and other diseases of the circulatory system: Secondary | ICD-10-CM | POA: Insufficient documentation

## 2021-01-27 DIAGNOSIS — R5383 Other fatigue: Secondary | ICD-10-CM | POA: Insufficient documentation

## 2021-01-27 DIAGNOSIS — Z79899 Other long term (current) drug therapy: Secondary | ICD-10-CM | POA: Insufficient documentation

## 2021-01-27 DIAGNOSIS — Z806 Family history of leukemia: Secondary | ICD-10-CM | POA: Insufficient documentation

## 2021-01-27 DIAGNOSIS — Z801 Family history of malignant neoplasm of trachea, bronchus and lung: Secondary | ICD-10-CM | POA: Diagnosis not present

## 2021-01-27 DIAGNOSIS — E119 Type 2 diabetes mellitus without complications: Secondary | ICD-10-CM | POA: Insufficient documentation

## 2021-01-27 DIAGNOSIS — E039 Hypothyroidism, unspecified: Secondary | ICD-10-CM | POA: Diagnosis not present

## 2021-01-27 DIAGNOSIS — D696 Thrombocytopenia, unspecified: Secondary | ICD-10-CM | POA: Diagnosis not present

## 2021-01-27 DIAGNOSIS — E669 Obesity, unspecified: Secondary | ICD-10-CM | POA: Diagnosis not present

## 2021-01-27 DIAGNOSIS — C61 Malignant neoplasm of prostate: Secondary | ICD-10-CM | POA: Diagnosis present

## 2021-01-27 DIAGNOSIS — Z5111 Encounter for antineoplastic chemotherapy: Secondary | ICD-10-CM | POA: Insufficient documentation

## 2021-01-27 DIAGNOSIS — Z191 Hormone sensitive malignancy status: Secondary | ICD-10-CM

## 2021-01-27 DIAGNOSIS — Z800 Family history of malignant neoplasm of digestive organs: Secondary | ICD-10-CM | POA: Insufficient documentation

## 2021-01-27 MED ORDER — DEGARELIX ACETATE 80 MG ~~LOC~~ SOLR
80.0000 mg | Freq: Once | SUBCUTANEOUS | Status: AC
  Administered 2021-01-27: 80 mg via SUBCUTANEOUS
  Filled 2021-01-27: qty 4

## 2021-01-27 NOTE — Progress Notes (Signed)
Patient tolerated injection with no complaints voiced. Site clean and dry with no bruising or swelling noted at site. See MAR for details. Band aid applied.  Patient stable during and after injection. VSS with discharge and left in satisfactory condition with no s/s of distress noted.  

## 2021-01-27 NOTE — Patient Instructions (Signed)
[place]  Discharge Instructions: Thank you for choosing [facility] to provide your oncology and hematology care.  If you have a lab appointment with the [facility], please come in thru the Main Entrance and check in at the main information desk.  Wear comfortable clothing and clothing appropriate for easy access to any Portacath or PICC line.   We strive to give you quality time with your provider. You may need to reschedule your appointment if you arrive late (15 or more minutes).  Arriving late affects you and other patients whose appointments are after yours.  Also, if you miss three or more appointments without notifying the office, you may be dismissed from the clinic at the provider's discretion.      For prescription refill requests, have your pharmacy contact our office and allow 72 hours for refills to be completed.    Today you received the following chemotherapy and/or immunotherapy agents Firmagon injection. Return as scheduled.   To help prevent nausea and vomiting after your treatment, we encourage you to take your nausea medication as directed.  BELOW ARE SYMPTOMS THAT SHOULD BE REPORTED IMMEDIATELY: *FEVER GREATER THAN 100.4 F (38 C) OR HIGHER *CHILLS OR SWEATING *NAUSEA AND VOMITING THAT IS NOT CONTROLLED WITH YOUR NAUSEA MEDICATION *UNUSUAL SHORTNESS OF BREATH *UNUSUAL BRUISING OR BLEEDING *URINARY PROBLEMS (pain or burning when urinating, or frequent urination) *BOWEL PROBLEMS (unusual diarrhea, constipation, pain near the anus) TENDERNESS IN MOUTH AND THROAT WITH OR WITHOUT PRESENCE OF ULCERS (sore throat, sores in mouth, or a toothache) UNUSUAL RASH, SWELLING OR PAIN  UNUSUAL VAGINAL DISCHARGE OR ITCHING   Items with * indicate a potential emergency and should be followed up as soon as possible or go to the Emergency Department if any problems should occur.  Please show the CHEMOTHERAPY ALERT CARD or IMMUNOTHERAPY ALERT CARD at  check-in to the Emergency Department and triage nurse.  Should you have questions after your visit or need to cancel or reschedule your appointment, please contact [facility] [phone]  and follow the prompts.  Office hours are 8:00 a.m. to 4:30 p.m. Monday - Friday. Please note that voicemails left after 4:00 p.m. may not be returned until the following business day.  We are closed weekends and major holidays. You have access to a nurse at all times for urgent questions. Please call the main number to the clinic [phone] and follow the prompts.  For any non-urgent questions, you may also contact your provider using MyChart. We now offer e-Visits for anyone 65 and older to request care online for non-urgent symptoms. For details visit [email or web address].   Also download the MyChart app! Go to the app store, search "MyChart", open the app, select [place], and log in with your MyChart username and password.  Due to Covid, a mask is required upon entering the hospital/clinic. If you do not have a mask, one will be given to you upon arrival. For doctor visits, patients may have 1 support person aged 65 or older with them. For treatment visits, patients cannot have anyone with them due to current Covid guidelines and our immunocompromised population.

## 2021-02-03 ENCOUNTER — Other Ambulatory Visit: Payer: Self-pay

## 2021-02-03 ENCOUNTER — Ambulatory Visit (INDEPENDENT_AMBULATORY_CARE_PROVIDER_SITE_OTHER): Payer: Medicare Other | Admitting: Physician Assistant

## 2021-02-03 ENCOUNTER — Ambulatory Visit (HOSPITAL_COMMUNITY)
Admission: RE | Admit: 2021-02-03 | Discharge: 2021-02-03 | Disposition: A | Discharge status: Home / Self Care | Payer: Medicare Other | Source: Ambulatory Visit | Attending: Vascular Surgery | Admitting: Vascular Surgery

## 2021-02-03 VITALS — BP 102/70 | HR 57 | Temp 97.7°F | Resp 20 | Ht 72.0 in | Wt 306.9 lb

## 2021-02-03 DIAGNOSIS — I6523 Occlusion and stenosis of bilateral carotid arteries: Secondary | ICD-10-CM

## 2021-02-03 NOTE — Progress Notes (Signed)
Carotid Artery Follow-Up   VASCULAR SURGERY ASSESSMENT & PLAN:   Peter Dunlap is a 65 y.o. male returns for routine follow-up.  Bilateral carotid artery stenosis: The patient has no symptoms referable to carotid artery stenosis.  Duplex examination today is stable as compared to 18 months ago.  We reviewed the signs and symptoms of stroke/TIA and advised the patient to call EMS should these occur.   Continue optimal medical management of diabetes, hypertension and follow-up with primary care physician. Encouraged continuation of complete smoking cessation. Continue the following medications: statin, aspirin. Follow-up in 18 months with carotid duplex ultrasound.  SUBJECTIVE:   The patient denies monocular blindness, slurred speech, facial drooping, extremity weakness or numbness.He reports having cervical disk disease and occasional bilateral upper extremity tingling.  PHYSICAL EXAM:   Vitals:   02/03/21 1037 02/03/21 1041  BP: 93/62 102/70  Pulse: (!) 57   Resp: 20   Temp: 97.7 F (36.5 C)   SpO2: 98%      General appearance: Well-developed, well-nourished in no apparent distress Neurologic: Alert and oriented x4, face symmetric, speech fluent, 5 out of 5 bilateral upper extremity grip strength, triceps and biceps strength Cardiovascular: Heart rate and rhythm are regular.  No carotid bruits. Respirations: Nonlabored  NON-INVASIVE VASCULAR STUDIES  02/03/2021 Summary:  Right Carotid: Velocities in the right ICA are consistent with a 1-39%  stenosis.                 Patent CEA.   Left Carotid: Velocities in the left ICA are consistent with a 1-39%  stenosis.   Vertebrals:  Bilateral vertebral arteries demonstrate antegrade flow.  Subclavians: Normal flow hemodynamics were seen in bilateral subclavian               arteries.   *See table(s) above for measurements and observations.       Preliminary      PROBLEM LIST:    The patient's past medical history,  past surgical history, family history, social history, allergy list and medication list are reviewed.  Right CEA 07/2008 by Dr. Donnetta Hutching. No previous stroke symptoms. History of DM.  CURRENT MEDS:    Current Outpatient Medications:    allopurinol (ZYLOPRIM) 100 MG tablet, TAKE 1 TABLET(100 MG) BY MOUTH DAILY, Disp: 90 tablet, Rfl: 0   apalutamide (ERLEADA) 60 MG tablet, TAKE 4 TABLETS (240 MG TOTAL) BY MOUTH DAILY. MAY BE TAKEN WITH OR WITHOUT FOOD. SWALLOW TABLETS WHOLE., Disp: 120 tablet, Rfl: 3   aspirin EC 325 MG tablet, Take 1 tablet (325 mg total) daily by mouth., Disp: 30 tablet, Rfl: 0   Blood Glucose Monitoring Suppl (BLOOD GLUCOSE SYSTEM PAK) KIT, Use as directed to monitor FSBS 1x daily. Dx: E11.9., Disp: 1 kit, Rfl: 1   colchicine 0.6 MG tablet, , Disp: , Rfl:    Cyanocobalamin (VITAMIN B 12) 500 MCG TABS, Take by mouth., Disp: , Rfl:    DEXILANT 60 MG capsule, TAKE 1 CAPSULE(60 MG) BY MOUTH DAILY, Disp: 30 capsule, Rfl: 5   diphenoxylate-atropine (LOMOTIL) 2.5-0.025 MG tablet, Take 1 tablet by mouth every 6 (six) hours as needed., Disp: , Rfl:    docusate sodium (COLACE) 100 MG capsule, Take 100 mg by mouth 2 (two) times daily., Disp: , Rfl:    fluticasone (FLONASE) 50 MCG/ACT nasal spray, Place 2 sprays into both nostrils daily., Disp: 16 g, Rfl: 6   furosemide (LASIX) 20 MG tablet, TAKE 3 TABLETS BY MOUTH DAILY, Disp: 270 tablet, Rfl: 3  glipiZIDE (GLUCOTROL XL) 5 MG 24 hr tablet, TAKE 1 TABLET(5 MG) BY MOUTH DAILY WITH BREAKFAST, Disp: 90 tablet, Rfl: 0   isosorbide mononitrate (IMDUR) 60 MG 24 hr tablet, Take 1 tablet (60 mg total) by mouth daily., Disp: 90 tablet, Rfl: 2   levothyroxine (SYNTHROID) 200 MCG tablet, TAKE 1 TABLET(200 MCG) BY MOUTH DAILY BEFORE AND BREAKFAST, Disp: 90 tablet, Rfl: 0   loratadine (CLARITIN) 10 MG tablet, TAKE 1 TABLET(10 MG) BY MOUTH DAILY AS NEEDED FOR ALLERGIES, Disp: 30 tablet, Rfl: 2   Magnesium Oxide 400 (240 Mg) MG TABS, TAKE 1 TABLET(400 MG)  BY MOUTH TWICE DAILY, Disp: 180 tablet, Rfl: 1   metFORMIN (GLUCOPHAGE) 500 MG tablet, TAKE 1 TABLET BY MOUTH TWICE DAILY, Disp: 180 tablet, Rfl: 0   nitroGLYCERIN (NITROSTAT) 0.4 MG SL tablet, PLACE 1 TABLET UNDER THE TONGUE EVERY 5 MINUTES AS NEEDED FOR CHEST PAIN. CALL 911 AT THIRD DOSE IN 15 MINUTES, Disp: 25 tablet, Rfl: 1   ONETOUCH ULTRA test strip, USE TO TEST ONCE DAILY, Disp: 100 strip, Rfl: 1   oxyCODONE-acetaminophen (PERCOCET) 10-325 MG tablet, Take 1 tablet by mouth every 8 (eight) hours as needed for pain., Disp: 90 tablet, Rfl: 0   pantoprazole (PROTONIX) 40 MG tablet, TAKE 1 TABLET(40 MG) BY MOUTH DAILY, Disp: 30 tablet, Rfl: 3   silodosin (RAPAFLO) 8 MG CAPS capsule, Take 1 capsule (8 mg total) by mouth 2 (two) times daily., Disp: 60 capsule, Rfl: 11   simvastatin (ZOCOR) 40 MG tablet, TAKE 1 TABLET(40 MG) BY MOUTH DAILY, Disp: 90 tablet, Rfl: 1   SIMVASTATIN PO, , Disp: , Rfl:    sotalol (BETAPACE) 160 MG tablet, Take 160 mg by mouth 2 (two) times daily., Disp: , Rfl:    sotalol (BETAPACE) 80 MG tablet, Take 2 tablets (160 mg) in the AM and 1 tablet (80 mg) in the PM, Disp: 135 tablet, Rfl: 3   Vitamin D, Ergocalciferol, (DRISDOL) 1.25 MG (50000 UNIT) CAPS capsule, TAKE 1 CAPSULE BY MOUTH EVERY 7 DAYS, Disp: 12 capsule, Rfl: 0   REVIEW OF SYSTEMS:   _0  denotes positive finding, _1  denotes negative finding Cardiac  Comments:  Chest pain or chest pressure:    Shortness of breath upon exertion:    Short of breath when lying flat:    Irregular heart rhythm:        Vascular    Pain in calf, thigh, or hip brought on by ambulation:    Pain in feet at night that wakes you up from your sleep:     Blood clot in your veins:    Leg swelling:         Pulmonary    Oxygen at home:    Productive cough:     Wheezing:         Neurologic    Sudden weakness in arms or legs:     Sudden numbness in arms or legs:     Sudden onset of difficulty speaking or slurred speech:     Temporary loss of vision in one eye:     Problems with dizziness:         Gastrointestinal    Blood in stool:     Vomited blood:         Genitourinary    Burning when urinating:     Blood in urine:        Psychiatric    Major depression:         Hematologic  Bleeding problems:    Problems with blood clotting too easily:        Skin    Rashes or ulcers:        Constitutional    Fever or chills:     Barbie Banner, PA-C  Office: [phone] 02/03/2021

## 2021-02-09 ENCOUNTER — Ambulatory Visit (INDEPENDENT_AMBULATORY_CARE_PROVIDER_SITE_OTHER): Payer: Medicare Other

## 2021-02-09 DIAGNOSIS — I495 Sick sinus syndrome: Secondary | ICD-10-CM | POA: Diagnosis not present

## 2021-02-09 LAB — CUP PACEART REMOTE DEVICE CHECK
Battery Remaining Longevity: 55 mo
Battery Remaining Percentage: 45 %
Battery Voltage: 2.96 V
Brady Statistic AP VP Percent: 1 %
Brady Statistic AP VS Percent: 28 %
Brady Statistic AS VP Percent: 1 %
Brady Statistic AS VS Percent: 72 %
Brady Statistic RA Percent Paced: 28 %
Brady Statistic RV Percent Paced: 1 %
Date Time Interrogation Session: 20220907020014
Implantable Lead Implant Date: 20160415
Implantable Lead Implant Date: 20160415
Implantable Lead Location: 753859
Implantable Lead Location: 753860
Implantable Pulse Generator Implant Date: 20160415
Lead Channel Impedance Value: 440 Ohm
Lead Channel Impedance Value: 560 Ohm
Lead Channel Pacing Threshold Amplitude: 0.75 V
Lead Channel Pacing Threshold Amplitude: 0.75 V
Lead Channel Pacing Threshold Pulse Width: 0.5 ms
Lead Channel Pacing Threshold Pulse Width: 0.5 ms
Lead Channel Sensing Intrinsic Amplitude: 12 mV
Lead Channel Sensing Intrinsic Amplitude: 3.6 mV
Lead Channel Setting Pacing Amplitude: 2 V
Lead Channel Setting Pacing Amplitude: 2.5 V
Lead Channel Setting Pacing Pulse Width: 0.5 ms
Lead Channel Setting Sensing Sensitivity: 2 mV
Pulse Gen Model: 2240
Pulse Gen Serial Number: 7756161

## 2021-02-14 ENCOUNTER — Other Ambulatory Visit (HOSPITAL_COMMUNITY): Payer: Self-pay

## 2021-02-16 ENCOUNTER — Other Ambulatory Visit (HOSPITAL_COMMUNITY): Payer: Self-pay

## 2021-02-17 NOTE — Progress Notes (Signed)
Remote pacemaker transmission.   

## 2021-02-24 ENCOUNTER — Other Ambulatory Visit: Payer: Self-pay

## 2021-02-24 ENCOUNTER — Encounter (HOSPITAL_COMMUNITY): Payer: Self-pay

## 2021-02-24 ENCOUNTER — Inpatient Hospital Stay (HOSPITAL_COMMUNITY): Payer: Medicare Other | Attending: Hematology

## 2021-02-24 VITALS — BP 96/57 | HR 64 | Temp 98.2°F | Resp 19

## 2021-02-24 DIAGNOSIS — C778 Secondary and unspecified malignant neoplasm of lymph nodes of multiple regions: Secondary | ICD-10-CM | POA: Diagnosis not present

## 2021-02-24 DIAGNOSIS — Z191 Hormone sensitive malignancy status: Secondary | ICD-10-CM

## 2021-02-24 DIAGNOSIS — C61 Malignant neoplasm of prostate: Secondary | ICD-10-CM | POA: Diagnosis present

## 2021-02-24 DIAGNOSIS — Z5111 Encounter for antineoplastic chemotherapy: Secondary | ICD-10-CM | POA: Diagnosis not present

## 2021-02-24 MED ORDER — DEGARELIX ACETATE 80 MG ~~LOC~~ SOLR
80.0000 mg | Freq: Once | SUBCUTANEOUS | Status: AC
  Administered 2021-02-24: 80 mg via SUBCUTANEOUS
  Filled 2021-02-24: qty 4

## 2021-02-24 NOTE — Patient Instructions (Signed)
[place]  Discharge Instructions: Thank you for choosing [facility] to provide your oncology and hematology care.  If you have a lab appointment with the [facility], please come in thru the Main Entrance and check in at the main information desk.  Wear comfortable clothing and clothing appropriate for easy access to any Portacath or PICC line.   We strive to give you quality time with your provider. You may need to reschedule your appointment if you arrive late (15 or more minutes).  Arriving late affects you and other patients whose appointments are after yours.  Also, if you miss three or more appointments without notifying the office, you may be dismissed from the clinic at the provider's discretion.      For prescription refill requests, have your pharmacy contact our office and allow 72 hours for refills to be completed.    Today you received the following Peter Dunlap, return as scheduled.   To help prevent nausea and vomiting after your treatment, we encourage you to take your nausea medication as directed.  BELOW ARE SYMPTOMS THAT SHOULD BE REPORTED IMMEDIATELY: *FEVER GREATER THAN 100.4 F (38 C) OR HIGHER *CHILLS OR SWEATING *NAUSEA AND VOMITING THAT IS NOT CONTROLLED WITH YOUR NAUSEA MEDICATION *UNUSUAL SHORTNESS OF BREATH *UNUSUAL BRUISING OR BLEEDING *URINARY PROBLEMS (pain or burning when urinating, or frequent urination) *BOWEL PROBLEMS (unusual diarrhea, constipation, pain near the anus) TENDERNESS IN MOUTH AND THROAT WITH OR WITHOUT PRESENCE OF ULCERS (sore throat, sores in mouth, or a toothache) UNUSUAL RASH, SWELLING OR PAIN  UNUSUAL VAGINAL DISCHARGE OR ITCHING   Items with * indicate a potential emergency and should be followed up as soon as possible or go to the Emergency Department if any problems should occur.  Please show the CHEMOTHERAPY ALERT CARD or IMMUNOTHERAPY ALERT CARD at check-in to the Emergency Department and triage  nurse.  Should you have questions after your visit or need to cancel or reschedule your appointment, please contact [facility] [phone]  and follow the prompts.  Office hours are 8:00 a.m. to 4:30 p.m. Monday - Friday. Please note that voicemails left after 4:00 p.m. may not be returned until the following business day.  We are closed weekends and major holidays. You have access to a nurse at all times for urgent questions. Please call the main number to the clinic [phone] and follow the prompts.  For any non-urgent questions, you may also contact your provider using MyChart. We now offer e-Visits for anyone 74 and older to request care online for non-urgent symptoms. For details visit [email or web address].   Also download the MyChart app! Go to the app store, search "MyChart", open the app, select [place], and log in with your MyChart username and password.  Due to Covid, a mask is required upon entering the hospital/clinic. If you do not have a mask, one will be given to you upon arrival. For doctor visits, patients may have 1 support person aged 98 or older with them. For treatment visits, patients cannot have anyone with them due to current Covid guidelines and our immunocompromised population.

## 2021-02-24 NOTE — Progress Notes (Signed)
Patient tolerated injection with no complaints voiced. Site clean and dry with no bruising or swelling noted at site. See MAR for details. Band aid applied.  Patient stable during and after injection. VSS with discharge and left in satisfactory condition with no s/s of distress noted.  

## 2021-02-28 ENCOUNTER — Other Ambulatory Visit: Payer: Self-pay

## 2021-02-28 ENCOUNTER — Encounter: Payer: Self-pay | Admitting: Podiatrist

## 2021-02-28 ENCOUNTER — Ambulatory Visit (INDEPENDENT_AMBULATORY_CARE_PROVIDER_SITE_OTHER): Payer: Medicare Other | Admitting: Podiatrist

## 2021-02-28 DIAGNOSIS — B351 Tinea unguium: Secondary | ICD-10-CM | POA: Diagnosis not present

## 2021-02-28 DIAGNOSIS — M79674 Pain in right toe(s): Secondary | ICD-10-CM | POA: Diagnosis not present

## 2021-02-28 DIAGNOSIS — M79675 Pain in left toe(s): Secondary | ICD-10-CM | POA: Diagnosis not present

## 2021-02-28 DIAGNOSIS — L600 Ingrowing nail: Secondary | ICD-10-CM | POA: Diagnosis not present

## 2021-02-28 NOTE — Patient Instructions (Signed)
Diabetes Mellitus and Foot Care Foot care is an important part of your health, especially when you have diabetes. Diabetes may cause you to have problems because of poor blood flow (circulation) to your feet and legs, which can cause your skin to: Become thinner and drier. Break more easily. Heal more slowly. Peel and crack. You may also have nerve damage (neuropathy) in your legs and feet, causing decreased feeling in them. This means that you may not notice minor injuries to your feet that could lead to more serious problems. Noticing and addressing any potential problems early is the best way to prevent future foot problems. How to care for your feet Foot hygiene Wash your feet daily with warm water and mild soap. Do not use hot water. Then, pat your feet and the areas between your toes until they are completely dry. Do not soak your feet as this can dry your skin. You may file the edges of your nails with an emery board or nail file. Apply a moisturizing lotion or petroleum jelly to the skin on your feet and to dry, brittle toenails. Use lotion that does not contain alcohol and is unscented. Do not apply lotion between your toes. Shoes and socks Wear clean socks or stockings every day. Make sure they are not too tight. Do not wear knee-high stockings since they may decrease blood flow to your legs. Wear shoes that fit properly and have enough cushioning. Always look in your shoes before you put them on to be sure there are no objects inside. To break in new shoes, wear them for just a few hours a day. This prevents injuries on your feet. Wounds, scrapes, corns, and calluses  Check your feet daily for blisters, cuts, bruises, sores, and redness. If you cannot see the bottom of your feet, use a mirror or ask someone for help. Do not cut corns or calluses or try to remove them with medicine. If you find a minor scrape, cut, or break in the skin on your feet, keep it and the skin around it clean  and dry. You may clean these areas with mild soap and water. Do not clean the area with peroxide, alcohol, or iodine. If you have a wound, scrape, corn, or callus on your foot, look at it several times a day to make sure it is healing and not infected. Check for: Redness, swelling, or pain. Fluid or blood. Warmth. Pus or a bad smell. General tips Do not cross your legs. This may decrease blood flow to your feet. Do not use heating pads or hot water bottles on your feet. They may burn your skin. If you have lost feeling in your feet or legs, you may not know this is happening until it is too late. Protect your feet from hot and cold by wearing shoes, such as at the beach or on hot pavement. Schedule a complete foot exam at least once a year (annually) or more often if you have foot problems. Report any cuts, sores, or bruises to your health care provider immediately. Where to find more information American Diabetes Association: www.diabetes.org Association of Diabetes Care & Education Specialists: www.diabeteseducator.org Contact a health care provider if: You have a medical condition that increases your risk of infection and you have any cuts, sores, or bruises on your feet. You have an injury that is not healing. You have redness on your legs or feet. You feel burning or tingling in your legs or feet. You have pain or  cramps in your legs and feet. Your legs or feet are numb. Your feet always feel cold. You have pain around any toenails. Get help right away if: You have a wound, scrape, corn, or callus on your foot and: You have pain, swelling, or redness that gets worse. You have fluid or blood coming from the wound, scrape, corn, or callus. Your wound, scrape, corn, or callus feels warm to the touch. You have pus or a bad smell coming from the wound, scrape, corn, or callus. You have a fever. You have a red line going up your leg. Summary Check your feet every day for blisters,  cuts, bruises, sores, and redness. Apply a moisturizing lotion or petroleum jelly to the skin on your feet and to dry, brittle toenails. Wear shoes that fit properly and have enough cushioning. If you have foot problems, report any cuts, sores, or bruises to your health care provider immediately. Schedule a complete foot exam at least once a year (annually) or more often if you have foot problems. This information is not intended to replace advice given to you by your health care provider. Make sure you discuss any questions you have with your health care provider. Document Revised: 12/11/2019 Document Reviewed: 12/11/2019 Elsevier Patient Education  [place].

## 2021-02-28 NOTE — Progress Notes (Signed)
HPI: Patient is 65 y.o. male who presents today for a diabetic foot exam as well as preventative nail care.  He relates his great toes become ingrown on the lateral borders bilaterally and become painful when the nails grow long.  He relates he also has tingling at the bottom of both feet.  He is currently undergoing chemotherapy.  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   Bilateral carotid artery stenosis 08/12/2019   Carpal tunnel syndrome 07/03/2018   Ulnar neuropathy at elbow 07/03/2018   Essential hypertension 02/21/2018   Vitamin D deficiency 02/21/2018   Presence of Watchman left atrial appendage closure device 04/12/2017   Chronic pain syndrome 10/17/2016   Prostate cancer ([place]) 03/10/2016   History of right-sided carotid endarterectomy 11/25/2015   Encounter for postoperative carotid endarterectomy surveillance 11/25/2015   Paroxysmal atrial fibrillation ([place])    Pacemaker-St Jude 09/18/2014   Tachy-brady syndrome ([place]) 08/19/2014   Chronic insomnia 07/24/2014   OA (osteoarthritis) of knee 05/26/2014   S/P subdural hematoma evacuation-Nov 2015 04/10/2014   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[place]) 04/02/2014   Chronic diastolic heart failure ([place]) 03/04/2014   Hypothyroidism following radioiodine therapy 02/17/2014   Gout 02/17/2014   OSA (obstructive sleep apnea) 02/17/2014   Class 3 obesity 12/04/2013   Type 2 diabetes mellitus with neurological complications ([place]) 27/74/1287   Hypertrophic cardiomyopathy ([place])  09/07/2011   Hypertensive heart disease with CHF (congestive heart failure) ([facility])    CAD (coronary artery disease)    Cerebrovascular disease    Mixed hyperlipidemia    ERECTILE DYSFUNCTION, ORGANIC 08/11/2010   PSVT (paroxysmal supraventricular tachycardia) ([place]) 06/03/2008   Hereditary and idiopathic peripheral neuropathy 01/28/2008   BPH (benign prostatic hypertrophy) 05/11/2006   GERD 04/23/2006    Current Outpatient Medications on File Prior to Visit  Medication Sig Dispense Refill   allopurinol (ZYLOPRIM) 100 MG tablet TAKE 1 TABLET(100 MG) BY MOUTH DAILY 90 tablet 0   apalutamide (ERLEADA) 60 MG tablet TAKE 4 TABLETS (240 MG TOTAL) BY MOUTH DAILY. MAY BE TAKEN WITH OR WITHOUT FOOD. SWALLOW TABLETS WHOLE. 120 tablet 3   aspirin EC 325 MG tablet Take 1 tablet (325 mg total) daily by mouth. 30 tablet 0   Blood Glucose Monitoring Suppl (BLOOD GLUCOSE SYSTEM PAK) KIT Use as directed to monitor FSBS 1x daily. Dx: E11.9. 1 kit 1   colchicine 0.6 MG tablet      Cyanocobalamin (VITAMIN B 12) 500 MCG TABS Take by mouth.     DEXILANT 60 MG capsule TAKE 1 CAPSULE(60 MG) BY MOUTH DAILY 30 capsule 5   diphenoxylate-atropine (LOMOTIL) 2.5-0.025 MG tablet Take 1 tablet by mouth every 6 (six) hours as needed.     docusate sodium (COLACE) 100 MG capsule Take 100 mg by mouth 2 (two) times daily.     fluticasone (FLONASE) 50 MCG/ACT nasal spray Place 2 sprays into both nostrils daily. 16 g 6   furosemide (LASIX) 20 MG tablet TAKE 3 TABLETS BY MOUTH DAILY 270 tablet 3   glipiZIDE (  GLUCOTROL XL) 5 MG 24 hr tablet TAKE 1 TABLET(5 MG) BY MOUTH DAILY WITH BREAKFAST 90 tablet 0   isosorbide mononitrate (IMDUR) 60 MG 24 hr tablet Take 1 tablet (60 mg total) by mouth daily. 90 tablet 2   levothyroxine (SYNTHROID) 200 MCG tablet TAKE 1 TABLET(200 MCG) BY MOUTH DAILY BEFORE AND BREAKFAST 90 tablet 0   levothyroxine (SYNTHROID) 25 MCG tablet Take 25 mcg by mouth daily.     loratadine (CLARITIN) 10 MG tablet  TAKE 1 TABLET(10 MG) BY MOUTH DAILY AS NEEDED FOR ALLERGIES 30 tablet 2   Magnesium Oxide 400 (240 Mg) MG TABS TAKE 1 TABLET(400 MG) BY MOUTH TWICE DAILY 180 tablet 1   metFORMIN (GLUCOPHAGE) 500 MG tablet TAKE 1 TABLET BY MOUTH TWICE DAILY 180 tablet 0   naloxone (NARCAN) nasal spray 4 mg/0.1 mL Place 4 mg into the nose as needed.     nitroGLYCERIN (NITROSTAT) 0.4 MG SL tablet PLACE 1 TABLET UNDER THE TONGUE EVERY 5 MINUTES AS NEEDED FOR CHEST PAIN. CALL 911 AT THIRD DOSE IN 15 MINUTES 25 tablet 1   ONETOUCH ULTRA test strip USE TO TEST ONCE DAILY 100 strip 1   oxyCODONE-acetaminophen (PERCOCET) 10-325 MG tablet Take 1 tablet by mouth every 8 (eight) hours as needed for pain. 90 tablet 0   pantoprazole (PROTONIX) 40 MG tablet TAKE 1 TABLET(40 MG) BY MOUTH DAILY 30 tablet 3   rosuvastatin (CRESTOR) 40 MG tablet Take 40 mg by mouth daily.     silodosin (RAPAFLO) 8 MG CAPS capsule Take 1 capsule (8 mg total) by mouth 2 (two) times daily. 60 capsule 11   simvastatin (ZOCOR) 40 MG tablet TAKE 1 TABLET(40 MG) BY MOUTH DAILY 90 tablet 1   SIMVASTATIN PO      sotalol (BETAPACE) 160 MG tablet Take 160 mg by mouth 2 (two) times daily.     sotalol (BETAPACE) 80 MG tablet Take 2 tablets (160 mg) in the AM and 1 tablet (80 mg) in the PM 135 tablet 3   Vitamin D, Ergocalciferol, (DRISDOL) 1.25 MG (50000 UNIT) CAPS capsule TAKE 1 CAPSULE BY MOUTH EVERY 7 DAYS 12 capsule 0   No current facility-administered medications on file prior to visit.    Allergies  Allergen Reactions   Trazodone And Nefazodone     Nightmares   Lactose Intolerance (Gi) Other (See Comments)    UPSET STOMACH     Review of Systems No fevers, chills, nausea, muscle aches, no difficulty breathing, no calf pain, no chest pain or shortness of breath.   Physical Exam  GENERAL APPEARANCE: Alert, conversant. Appropriately groomed. No acute distress.   VASCULAR: Pedal pulses palpable DP and PT bilateral.  Capillary refill time is  immediate to all digits,  Proximal to distal cooling it warm to warm.  Digital perfusion adequate.   NEUROLOGIC: sensation is intact to 5.07 monofilament at 4/5 sites bilateral.  Light touch is intact bilateral, vibratory sensation decreased bilateral  MUSCULOSKELETAL: acceptable muscle strength, tone and stability bilateral.  Mild digital contractures noted bilateral.  With pes planus foot type noted.  No pain, crepitus or limitation noted with foot and ankle range of motion bilateral.   DERMATOLOGIC: skin is warm, supple, and dry.  No open lesions noted.  No rash, no pre ulcerative lesions. Digital nails are incurvated on the lateral nail borders bilateral.  No redness, no swelling, no sign of infection are noted.  Although ingrown nail tendency is seen.  Remainder of the digital nails are  elongated, thickened, brittle, dystrophic and painful with pressure.    Assessment   1. Pain due to onychomycosis of toenails of both feet   2. Ingrown toenail of both feet      Plan  Discussed treatment options and alternatives.  I recommended trying debridement to see if we could keep the lateral nail borders from growing into the skin and being uncomfortable.  The patient agreed and the nails were debrided via manual and mechanical means without complication.  Discussed diabetes and foot care.  Also discussed in the future should the nails continue to become uncomfortable we can remove the lateral nail borders in a permanent fashion should this be necessary.  Patient demonstrates an understanding of this conversation he will be seen back for preventive nail care in 3 months.

## 2021-03-03 LAB — TSH: TSH: 13.4 u[IU]/mL — ABNORMAL HIGH (ref 0.450–4.500)

## 2021-03-03 LAB — T4, FREE: Free T4: 1.38 ng/dL (ref 0.82–1.77)

## 2021-03-08 ENCOUNTER — Other Ambulatory Visit: Payer: Self-pay | Admitting: Nurse Practitioner

## 2021-03-08 NOTE — Patient Instructions (Signed)

## 2021-03-09 ENCOUNTER — Encounter: Payer: Self-pay | Admitting: Nurse Practitioner

## 2021-03-09 ENCOUNTER — Ambulatory Visit (INDEPENDENT_AMBULATORY_CARE_PROVIDER_SITE_OTHER): Payer: Medicare Other | Admitting: Nurse Practitioner

## 2021-03-09 VITALS — BP 94/55 | HR 63 | Ht 72.0 in | Wt 311.6 lb

## 2021-03-09 DIAGNOSIS — E890 Postprocedural hypothyroidism: Secondary | ICD-10-CM | POA: Diagnosis not present

## 2021-03-09 DIAGNOSIS — E782 Mixed hyperlipidemia: Secondary | ICD-10-CM

## 2021-03-09 DIAGNOSIS — I10 Essential (primary) hypertension: Secondary | ICD-10-CM

## 2021-03-09 DIAGNOSIS — E1165 Type 2 diabetes mellitus with hyperglycemia: Secondary | ICD-10-CM

## 2021-03-09 DIAGNOSIS — E559 Vitamin D deficiency, unspecified: Secondary | ICD-10-CM

## 2021-03-09 LAB — POCT GLYCOSYLATED HEMOGLOBIN (HGB A1C): HbA1c, POC (controlled diabetic range): 6.7 % (ref 0.0–7.0)

## 2021-03-09 LAB — POCT UA - MICROALBUMIN
Creatinine, POC: 300 mg/dL
Microalbumin Ur, POC: 150 mg/L

## 2021-03-09 MED ORDER — METFORMIN HCL 500 MG PO TABS: 500.0000 mg | ORAL_TABLET | Freq: Every day | ORAL | 0 refills | Status: DC

## 2021-03-09 NOTE — Progress Notes (Signed)
03/09/2021                                                 Endocrinology Follow Up  Subjective:    Patient ID: Peter Dunlap, male    DOB: 25-Jul-1955, PCP Dulce Sellar, MD  Patient presents today for follow-up of hypothyroidism following radioactive iodine treatment, and type 2 diabetes.  Past Medical History:  Diagnosis Date   Arteriosclerotic cardiovascular disease (ASCVD) 2005   catheterization in 10/2010:50% mid LAD, diffuse distal disease, circumflex irregularities, large dominant RCA with a 50% ostial, 70% distal, 60% posterolateral and 70% PDA; normal EF   Arthritis    Benign prostatic hypertrophy    Bilateral carpal tunnel syndrome 07/03/2018   Cerebrovascular disease 2010   R. carotid endarterectomy; Duplex in 10/2010-widely patent ICAs, subtotal left vertebral-not thought to be contributing to symptoms   Cervical spine disease    CT in 2012-advanced degeneration and spondylosis with moderate spinal stenosis at C3-C6   CHF (congestive heart failure) ([place])    Depression    Diabetes mellitus without complication ([place])    Erectile dysfunction    Family history of breast cancer    Family history of cancer of mouth    Family history of CML (chronic monocytic leukemia)    Family history of lung cancer    Family history of ovarian cancer    Family history of stomach cancer    Gastroesophageal reflux disease    H/O hiatal hernia    H/O: substance abuse ([place])    Cocaine, marijuana, alcohol.  Quit 2013.    Hyperlipidemia    Hypertension    Non-ST elevation myocardial infarction (NSTEMI), initial episode of care ([place]) 12/02/2013   DES LAD   Obesity    Prostate cancer ([place])    Sleep apnea    CPAP   Tachy-brady syndrome ([place])    a. 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 25, 2010    CEA   CORONARY ANGIOPLASTY WITH STENT PLACEMENT  12/03/2013   LAD 90%-->0% W/ Promus Premier DES 3.0 mm x 16 mm, CFX OK, RCA 40%, EF 70-75%   LEFT ATRIAL APPENDAGE OCCLUSION N/A 08/05/2015   Procedure: LEFT ATRIAL APPENDAGE OCCLUSION;  Surgeon: [name], MD;  Location: [facility];  Service: Cardiovascular;  Laterality: N/A;   LEFT HEART CATHETERIZATION WITH CORONARY ANGIOGRAM Left 12/03/2013   Procedure: LEFT HEART CATHETERIZATION WITH CORONARY ANGIOGRAM;  Surgeon: Leonie Man, MD;  Location: [facility] CATH LAB;  Service: Cardiovascular;  Laterality: Left;   LEFT HEART CATHETERIZATION WITH CORONARY ANGIOGRAM N/A 01/26/2014   Procedure: LEFT HEART CATHETERIZATION WITH CORONARY ANGIOGRAM;  Surgeon: Jettie Booze, MD;  Location: [facility] CATH LAB;  Service: Cardiovascular;  Laterality: N/A;   LEFT HEART CATHETERIZATION WITH CORONARY ANGIOGRAM N/A 08/03/2014   Procedure: LEFT HEART CATHETERIZATION WITH CORONARY ANGIOGRAM;  Surgeon: Burnell Blanks, MD;  Location: [facility] CATH LAB;  Service: Cardiovascular;  Laterality: N/A;   PERCUTANEOUS CORONARY STENT INTERVENTION (PCI-S)  12/03/2013   Procedure: PERCUTANEOUS CORONARY STENT INTERVENTION (PCI-S);  Surgeon: Leonie Man, MD;  Location: [facility] CATH LAB;  Service: Cardiovascular;;   PERMANENT PACEMAKER INSERTION N/A 09/18/2014   Procedure: PERMANENT PACEMAKER INSERTION;  Surgeon: [name], MD;  Location: [facility] CATH LAB;  Service: Cardiovascular;  Laterality: N/A;   RADIOFREQUENCY ABLATION  2005   for PSVT   TEE WITHOUT CARDIOVERSION N/A 07/27/2015   Procedure: TRANSESOPHAGEAL ECHOCARDIOGRAM (TEE);  Surgeon: Lelon Perla, MD;  Location: [facility] ENDOSCOPY;  Service: Cardiovascular;  Laterality: N/A;   TEE WITHOUT CARDIOVERSION N/A 09/15/2015   Procedure:  TRANSESOPHAGEAL ECHOCARDIOGRAM (TEE);  Surgeon: Thayer Headings, MD;  Location: [facility] ENDOSCOPY;  Service: Cardiovascular;  Laterality: N/A;   Social History   Socioeconomic History   Marital status: Married    Spouse name: Not on file   Number of children: 0   Years of education: Not on file   Highest education level: Not on file  Occupational History   Occupation: Retired  Tobacco Use   Smoking status: Former    Packs/day: 1.00    Years: 40.00    Pack years: 40.00    Types: Cigarettes    Start date: 10/20/1972    Quit date: 10/10/2012    Years since quitting: 8.4   Smokeless tobacco: Never   Tobacco comments:    Quit in May.   Vaping Use   Vaping Use: Never used  Substance and Sexual Activity   Alcohol use: Yes    Alcohol/week: 0.0 standard drinks    Comment: former drinker-- sober since 2013.    Drug use: No    Types: Cocaine    Comment: quit cocaine 10/2011   Sexual activity: Yes    Partners: Female  Other Topics Concern   Not on file  Social History Narrative   Lives in [place].   Social Determinants of Health   Financial Resource Strain: Low Risk    Difficulty of Paying Living Expenses: Not hard at all  Food Insecurity: No Food Insecurity   Worried About Charity fundraiser in the Last Year: Never true   [place] in the Last Year: Never true  Transportation Needs: No Transportation Needs   Lack of Transportation (Medical): No   Lack of Transportation (Non-Medical): No  Physical Activity: Inactive   Days of Exercise per Week: 0 days   Minutes of Exercise per Session: 0 min  Stress: No Stress Concern Present   Feeling of Stress : Not at all  Social Connections: Moderately Isolated   Frequency of Communication with Friends and Family: More than three times a week   Frequency of Social Gatherings with Friends and Family: Once a week   Attends Religious Services: Never   Marine scientist or Organizations: No   Attends Archivist  Meetings: Never   Marital Status: Married   Outpatient Encounter Medications as of 03/09/2021  Medication Sig   allopurinol (ZYLOPRIM) 100 MG tablet TAKE 1 TABLET(100 MG) BY MOUTH DAILY   apalutamide (ERLEADA) 60 MG tablet TAKE 4 TABLETS (240 MG TOTAL) BY MOUTH DAILY. MAY BE TAKEN WITH OR WITHOUT FOOD. SWALLOW TABLETS WHOLE.   aspirin EC 325 MG tablet Take 1 tablet (325 mg total) daily by mouth.   Blood Glucose Monitoring Suppl (BLOOD GLUCOSE SYSTEM PAK) KIT Use as directed to monitor FSBS 1x daily. Dx: E11.9.   colchicine 0.6 MG tablet  Cyanocobalamin (VITAMIN B 12) 500 MCG TABS Take by mouth.   DEXILANT 60 MG capsule TAKE 1 CAPSULE(60 MG) BY MOUTH DAILY   diphenoxylate-atropine (LOMOTIL) 2.5-0.025 MG tablet Take 1 tablet by mouth every 6 (six) hours as needed.   docusate sodium (COLACE) 100 MG capsule Take 100 mg by mouth 2 (two) times daily.   fluticasone (FLONASE) 50 MCG/ACT nasal spray Place 2 sprays into both nostrils daily.   furosemide (LASIX) 20 MG tablet TAKE 3 TABLETS BY MOUTH DAILY   glipiZIDE (GLUCOTROL XL) 5 MG 24 hr tablet TAKE 1 TABLET(5 MG) BY MOUTH DAILY WITH BREAKFAST   isosorbide mononitrate (IMDUR) 60 MG 24 hr tablet Take 1 tablet (60 mg total) by mouth daily.   levothyroxine (SYNTHROID) 200 MCG tablet TAKE 1 TABLET(200 MCG) BY MOUTH DAILY BEFORE AND BREAKFAST   levothyroxine (SYNTHROID) 25 MCG tablet Take 25 mcg by mouth daily.   loratadine (CLARITIN) 10 MG tablet TAKE 1 TABLET(10 MG) BY MOUTH DAILY AS NEEDED FOR ALLERGIES   Magnesium Oxide 400 (240 Mg) MG TABS TAKE 1 TABLET(400 MG) BY MOUTH TWICE DAILY   metFORMIN (GLUCOPHAGE) 500 MG tablet TAKE 1 TABLET BY MOUTH TWICE DAILY (Patient taking differently: Take 500 mg by mouth daily with breakfast. TAKE 1 TABLET BY MOUTH TWICE DAILY)   naloxone (NARCAN) nasal spray 4 mg/0.1 mL Place 4 mg into the nose as needed.   nitroGLYCERIN (NITROSTAT) 0.4 MG SL tablet PLACE 1 TABLET UNDER THE TONGUE EVERY 5 MINUTES AS NEEDED FOR  CHEST PAIN. CALL 911 AT THIRD DOSE IN 15 MINUTES   ONETOUCH ULTRA test strip USE TO TEST ONCE DAILY   oxyCODONE-acetaminophen (PERCOCET) 10-325 MG tablet Take 1 tablet by mouth every 8 (eight) hours as needed for pain.   pantoprazole (PROTONIX) 40 MG tablet TAKE 1 TABLET(40 MG) BY MOUTH DAILY   silodosin (RAPAFLO) 8 MG CAPS capsule Take 1 capsule (8 mg total) by mouth 2 (two) times daily.   simvastatin (ZOCOR) 40 MG tablet TAKE 1 TABLET(40 MG) BY MOUTH DAILY   sotalol (BETAPACE) 160 MG tablet Take 160 mg by mouth 2 (two) times daily.   sotalol (BETAPACE) 80 MG tablet Take 2 tablets (160 mg) in the AM and 1 tablet (80 mg) in the PM   Vitamin D, Ergocalciferol, (DRISDOL) 1.25 MG (50000 UNIT) CAPS capsule TAKE 1 CAPSULE BY MOUTH EVERY 7 DAYS   rosuvastatin (CRESTOR) 40 MG tablet Take 40 mg by mouth daily. (Patient not taking: Reported on 03/09/2021)   SIMVASTATIN PO  (Patient not taking: Reported on 03/09/2021)   No facility-administered encounter medications on file as of 03/09/2021.   ALLERGIES: Allergies  Allergen Reactions   Trazodone And Nefazodone     Nightmares   Lactose Intolerance (Gi) Other (See Comments)    UPSET STOMACH    VACCINATION STATUS: Immunization History  Administered Date(s) Administered   Influenza,inj,Quad PF,6+ Mos 02/17/2014, 03/09/2015, 03/10/2016, 03/27/2017, 03/20/2018, 03/04/2019, 03/15/2020   Moderna Sars-Covid-2 Vaccination 08/03/2019, 09/06/2019   Pneumococcal Polysaccharide-23 01/05/2014   Tdap 12/19/2010   Zoster Recombinat (Shingrix) 12/17/2019, 02/15/2020    Diabetes He presents for his follow-up diabetic visit. He has type 2 diabetes mellitus. His disease course has been stable. There are no hypoglycemic associated symptoms. Pertinent negatives for hypoglycemia include no headaches, nervousness/anxiousness or tremors. Associated symptoms include fatigue. Pertinent negatives for diabetes include no chest pain, no foot paresthesias, no polydipsia, no  polyuria and no weight loss. There are no hypoglycemic complications. Symptoms are stable. Diabetic complications include heart disease,  impotence, nephropathy and peripheral neuropathy. Risk factors for coronary artery disease include hypertension, diabetes mellitus, dyslipidemia, male sex, obesity, tobacco exposure and sedentary lifestyle. Current diabetic treatment includes oral agent (dual therapy). He is compliant with treatment most of the time. His weight is fluctuating minimally. He is following a generally unhealthy diet. When asked about meal planning, he reported none. He has not had a previous visit with a dietitian. He never participates in exercise. His home blood glucose trend is fluctuating minimally. His breakfast blood glucose range is generally 70-90 mg/dl. His bedtime blood glucose range is generally 110-130 mg/dl. (He presents today with his logs, no meter showing at goal fasting and postprandial glycemic profile.  His POCT A1c today is 6.7%, essentially unchanged from previous visit.  He denies any significant hypoglycemia.) An ACE inhibitor/angiotensin II receptor blocker is not being taken. He does not see a podiatrist.Eye exam is not current.  Thyroid Problem Presents for follow-up (He was given RAI therapy on March 16, 2014. - he denies family history of thyroid dysfunction .) visit. Symptoms include fatigue. Patient reports no anxiety, cold intolerance, constipation, diarrhea, heat intolerance, leg swelling, palpitations, tremors, weight gain or weight loss. The symptoms have been stable.     Review of systems  Constitutional: + Minimally fluctuating body weight,  current Body mass index is 42.26 kg/m. , + fatigue, no subjective hyperthermia, no subjective hypothermia Eyes: no blurry vision, no xerophthalmia ENT: no sore throat, no nodules palpated in throat, no dysphagia/odynophagia, no hoarseness Cardiovascular: no chest pain, no shortness of breath, no palpitations, no leg  swelling Respiratory: no cough, no shortness of breath Gastrointestinal: no nausea/vomiting/diarrhea Musculoskeletal: no muscle/joint aches Neurological: no tremors, no numbness, no tingling Psychiatric: no depression, no anxiety    Objective:    BP (!) 94/55   Pulse 63   Ht 6' (1.829 m)   Wt (!) 311 lb 9.6 oz (141.3 kg)   BMI 42.26 kg/m   Wt Readings from Last 3 Encounters:  03/09/21 (!) 311 lb 9.6 oz (141.3 kg)  02/03/21 (!) 306 lb 14.4 oz (139.2 kg)  12/30/20 (!) 311 lb 9.6 oz (141.3 kg)    BP Readings from Last 3 Encounters:  03/09/21 (!) 94/55  02/24/21 (!) 96/57  02/03/21 102/70     Physical Exam- Limited  Constitutional:  Body mass index is 42.26 kg/m. , not in acute distress, normal state of mind Eyes:  EOMI, no exophthalmos Neck: Supple Cardiovascular: RRR, no murmurs, rubs, or gallops, no edema Respiratory: Adequate breathing efforts, no crackles, rales, rhonchi, or wheezing Musculoskeletal: no gross deformities, strength intact in all four extremities, no gross restriction of joint movements Skin:  no rashes, no hyperemia Neurological: no tremor with outstretched hands    Diabetic Labs (most recent): Lab Results  Component Value Date   HGBA1C 6.7 03/09/2021   HGBA1C 6.6 (A) 12/07/2020   HGBA1C 6.9 08/04/2020     Lipid Panel ( most recent) Lipid Panel     Component Value Date/Time   CHOL 294 (H) 09/08/2020 0000   TRIG 233 (H) 09/08/2020 0000   HDL 34 (L) 09/08/2020 0000   CHOLHDL 8.6 (H) 09/08/2020 0000   VLDL 28 10/17/2016 0819   LDLCALC 217 (H) 09/08/2020 0000    Recent Results (from the past 2160 hour(s))  CBC with Differential     Status: Abnormal   Collection Time: 12/30/20 12:49 PM  Result Value Ref Range   WBC 6.7 4.0 - 10.5 K/uL   RBC 3.37 (L)  4.22 - 5.81 MIL/uL   Hemoglobin 11.9 (L) 13.0 - 17.0 g/dL   HCT 35.5 (L) 39.0 - 52.0 %   MCV 105.3 (H) 80.0 - 100.0 fL   MCH 35.3 (H) 26.0 - 34.0 pg   MCHC 33.5 30.0 - 36.0 g/dL   RDW  15.2 11.5 - 15.5 %   Platelets 121 (L) 150 - 400 K/uL   nRBC 0.0 0.0 - 0.2 %   Neutrophils Relative % 50 %   Neutro Abs 3.4 1.7 - 7.7 K/uL   Lymphocytes Relative 36 %   Lymphs Abs 2.4 0.7 - 4.0 K/uL   Monocytes Relative 10 %   Monocytes Absolute 0.7 0.1 - 1.0 K/uL   Eosinophils Relative 3 %   Eosinophils Absolute 0.2 0.0 - 0.5 K/uL   Basophils Relative 1 %   Basophils Absolute 0.1 0.0 - 0.1 K/uL   Immature Granulocytes 0 %   Abs Immature Granulocytes 0.01 0.00 - 0.07 K/uL    Comment: Performed at [facility], [address], [place], [place] [address]  Comprehensive metabolic panel     Status: Abnormal   Collection Time: 12/30/20 12:49 PM  Result Value Ref Range   Sodium 139 135 - 145 mmol/L   Potassium 3.7 3.5 - 5.1 mmol/L   Chloride 107 98 - 111 mmol/L   CO2 24 22 - 32 mmol/L   Glucose, Bld 91 70 - 99 mg/dL    Comment: Glucose reference range applies only to samples taken after fasting for at least 8 hours.   BUN 10 8 - 23 mg/dL   Creatinine, Ser 1.43 (H) 0.61 - 1.24 mg/dL   Calcium 9.5 8.9 - 10.3 mg/dL   Total Protein 7.7 6.5 - 8.1 g/dL   Albumin 3.7 3.5 - 5.0 g/dL   AST 26 15 - 41 U/L   ALT 13 0 - 44 U/L   Alkaline Phosphatase 78 38 - 126 U/L   Total Bilirubin 0.8 0.3 - 1.2 mg/dL   GFR, Estimated 54 (L) >60 mL/min    Comment: (NOTE) Calculated using the CKD-EPI Creatinine Equation (2021)    Anion gap 8 5 - 15    Comment: Performed at [facility], [address], [place], [place] [address]  PSA     Status: None   Collection Time: 12/30/20 12:49 PM  Result Value Ref Range   Prostatic Specific Antigen <0.01 0.00 - 4.00 ng/mL    Comment: CORRECTED RESULTS CALLED TO:  OFFICE CLOSED, CALL IN THE AM (NOTE) While PSA levels of <=4.0 ng/ml are reported as reference range, some men with levels below 4.0 ng/ml can have prostate cancer and many men with PSA above 4.0 ng/ml do not have prostate cancer.  Other tests such as free PSA, age specific reference ranges, PSA velocity  and PSA doubling time may be helpful especially in men less than 50 years old. Performed at [facility], [place] [address] [place], [place] [address] CORRECTED ON 07/28 AT 2040: PREVIOUSLY REPORTED AS 0.01   Testosterone     Status: Abnormal   Collection Time: 12/30/20 12:49 PM  Result Value Ref Range   Testosterone 8 (L) 264 - 916 ng/dL    Comment: (NOTE) Adult male reference interval is based on a population of healthy nonobese males (BMI <30) between 7 and 51 years old. [place], [place] [phone]. PMID: [id number]. Performed At: [facility] [address] [place], [place] [id number] Rush Farmer MD QQ:[id number]   [place]  Status: None   Collection Time: 02/09/21  2:00 AM  Result Value Ref Range   Date Time Interrogation Session [id number]    Pulse Generator Manufacturer [employer]    Pulse Gen Model 2240 Assurity    Pulse Gen Serial Number [id number]    Clinic Name [facility]    Implantable Pulse Generator Type Implantable Pulse Generator    Implantable Pulse Generator Implant Date [id number]    Implantable Lead Manufacturer [facility]    Implantable Lead Model 1688TC Tendril SDX    Implantable Lead Serial Number [id number]    Implantable Lead Implant Date [id number]    Implantable Lead Location Detail 1 UNKNOWN    Implantable Lead Location [id number]    Implantable Lead Manufacturer [facility]    Implantable Lead Model 1888TC Tendril ST Optim    Implantable Lead Serial Number [id number]    Implantable Lead Implant Date 07867544    Implantable Lead Location Detail 1 UNKNOWN    Implantable Lead Location [phone]    Lead Channel Setting Sensing Sensitivity 2.0 mV   Lead Channel Setting Sensing Adaptation Mode Fixed Pacing    Lead Channel Setting Pacing Amplitude 2.0 V   Lead Channel Setting Pacing Pulse Width 0.5 ms   Lead Channel Setting Pacing Amplitude 2.5 V   Lead Channel Status NULL    Lead Channel Impedance Value 440 ohm   Lead  Channel Sensing Intrinsic Amplitude 3.6 mV   Lead Channel Pacing Threshold Amplitude 0.75 V   Lead Channel Pacing Threshold Pulse Width 0.5 ms   Lead Channel Status NULL    Lead Channel Impedance Value 560 ohm   Lead Channel Sensing Intrinsic Amplitude 12.0 mV   Lead Channel Pacing Threshold Amplitude 0.75 V   Lead Channel Pacing Threshold Pulse Width 0.5 ms   Battery Status MOS    Battery Remaining Longevity 55 mo   Battery Remaining Percentage 45.0 %   Battery Voltage 2.96 V   Brady Statistic RA Percent Paced 28.0 %   Brady Statistic RV Percent Paced 1.0 %   Brady Statistic AP VP Percent 1.0 %   Brady Statistic AS VP Percent 1.0 %   Brady Statistic AP VS Percent 28.0 %   Brady Statistic AS VS Percent 72.0 %   Eval Rhythm SR at 61 bpm   TSH     Status: Abnormal   Collection Time: 03/02/21 11:29 AM  Result Value Ref Range   TSH 13.400 (H) 0.450 - 4.500 uIU/mL  T4, free     Status: None   Collection Time: 03/02/21 11:29 AM  Result Value Ref Range   Free T4 1.38 0.82 - 1.77 ng/dL  POCT glycosylated hemoglobin (Hb A1C)     Status: Normal   Collection Time: 03/09/21  9:11 AM  Result Value Ref Range   Hemoglobin A1C     HbA1c POC (<> result, manual entry)     HbA1c, POC (prediabetic range)     HbA1c, POC (controlled diabetic range) 6.7 0.0 - 7.0 %  POCT UA - Microalbumin     Status: Abnormal   Collection Time: 03/09/21  9:11 AM  Result Value Ref Range   Microalbumin Ur, POC 150 mg/L   Creatinine, POC 300 mg/dL   Albumin/Creatinine Ratio, Urine, POC 30-300      Assessment & Plan:   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  He is advised to continue his current dose of Levothyroxine 225 mcg po daily before  breakfast (close to max weight based calculation of total dose).   - We discussed about the correct intake of his thyroid hormone, on empty stomach at fasting, with water, separated by at least 30 minutes from  breakfast and other medications,  and separated by more than 4 hours from calcium, iron, multivitamins, acid reflux medications (PPIs). -Patient is made aware of the fact that thyroid hormone replacement is needed for life, dose to be adjusted by periodic monitoring of thyroid function tests.  2. Diabetes mellitus type 2 in obese [facility])  He presents today with his logs, no meter showing at goal fasting and postprandial glycemic profile.  His POCT A1c today is 6.7%, essentially unchanged from previous visit.  He denies any significant hypoglycemia.  - Nutritional counseling repeated at each appointment due to patients tendency to fall back in to old habits.  - The patient admits there is a room for improvement in their diet and drink choices. -  Suggestion is made for the patient to avoid simple carbohydrates from their diet including Cakes, Sweet Desserts / Pastries, Ice Cream, Soda (diet and regular), Sweet Tea, Candies, Chips, Cookies, Sweet Pastries, Store Bought Juices, Alcohol in Excess of 1-2 drinks a day, Artificial Sweeteners, Coffee Creamer, and "Sugar-free" Products. This will help patient to have stable blood glucose profile and potentially avoid unintended weight gain.   - I encouraged the patient to switch to unprocessed or minimally processed complex starch and increased protein intake (animal or plant source), fruits, and vegetables.   - Patient is advised to stick to a routine mealtimes to eat 3 meals a day and avoid unnecessary snacks (to snack only to correct hypoglycemia).  - He is advised to continue Metformin 500 mg p.o. daily with breakfast and Glipizide to 5 mg XL daily with breakfast.   -He is encouraged to monitor glucose 1-2 times per day, before breakfast and before bed, and call the clinic if he has readings less than 70 or greater than 300 for 3 tests in a row.  -He will be considered for incretin therapy on subsequent visits.  3. Essential hypertension His blood  pressure is controlled to target, slightly low today.  He is advised to continue Lasix 60 mg po daily.  4.  Hyperlipidemia:  His most recent lipid panel from 09/08/20 shows uncontrolled LDL at 217 and elevated triglycerides of 233.  He is advised to continue Simvastatin 40 mg po daily at bedtime.  Side effects and precautions discussed with him.  5. Vitamin D Deficiency: His most recent vitamin D level on 03/30/20 shows improvement to 68.  He has completed replenishment with ergocalciferol.  No need for additional supplementation at this time.   - I advised patient to maintain close follow up with Dulce Sellar, MD for primary care needs.       I spent 37 minutes in the care of the patient today including review of labs from [place], Lipids, Thyroid Function, Hematology (current and previous including abstractions from other facilities); face-to-face time discussing  his blood glucose readings/logs, discussing hypoglycemia and hyperglycemia episodes and symptoms, medications doses, his options of short and long term treatment based on the latest standards of care / guidelines;  discussion about incorporating lifestyle medicine;  and documenting the encounter.    Please refer to Patient Instructions for Blood Glucose Monitoring and Insulin/Medications Dosing Guide"  in media tab for additional information. Please  also refer to " Patient Self Inventory" in the Media  tab for reviewed  elements of pertinent patient history.  [name] participated in the discussions, expressed understanding, and voiced agreement with the above plans.  All questions were answered to his satisfaction. he is encouraged to contact clinic should he have any questions or concerns prior to his return visit.    Follow up plan: Return in about 4 months (around 07/10/2021) for Diabetes F/U with A1c in office, Thyroid follow up, Previsit labs, Bring meter and logs.  Rayetta Pigg, [facility] [address] [place], [place] [address] Phone: [phone] Fax: [phone]   03/09/2021, 9:43 AM

## 2021-03-14 ENCOUNTER — Other Ambulatory Visit (HOSPITAL_COMMUNITY): Payer: Self-pay | Admitting: Hematology

## 2021-03-14 ENCOUNTER — Other Ambulatory Visit (HOSPITAL_COMMUNITY): Payer: Self-pay

## 2021-03-14 MED ORDER — ERLEADA 60 MG PO TABS
ORAL_TABLET | ORAL | 3 refills | Status: DC
  Filled 2021-03-14: qty 120, 30d supply, fill #0
  Filled 2021-04-14: qty 120, 30d supply, fill #1
  Filled 2021-05-10: qty 120, 30d supply, fill #2
  Filled 2021-06-06: qty 120, 30d supply, fill #3

## 2021-03-16 ENCOUNTER — Other Ambulatory Visit (HOSPITAL_COMMUNITY): Payer: Self-pay

## 2021-03-21 ENCOUNTER — Other Ambulatory Visit: Payer: Self-pay

## 2021-03-21 MED ORDER — MAGNESIUM OXIDE -MG SUPPLEMENT 400 (240 MG) MG PO TABS: ORAL_TABLET | ORAL | 1 refills | Status: DC

## 2021-03-22 ENCOUNTER — Other Ambulatory Visit (HOSPITAL_COMMUNITY): Payer: Self-pay

## 2021-03-23 ENCOUNTER — Telehealth: Payer: Self-pay | Admitting: Internal Medicine

## 2021-03-23 DIAGNOSIS — R634 Abnormal weight loss: Secondary | ICD-10-CM

## 2021-03-23 DIAGNOSIS — R0600 Dyspnea, unspecified: Secondary | ICD-10-CM

## 2021-03-23 DIAGNOSIS — I209 Angina pectoris, unspecified: Secondary | ICD-10-CM

## 2021-03-23 DIAGNOSIS — M10071 Idiopathic gout, right ankle and foot: Secondary | ICD-10-CM

## 2021-03-23 MED ORDER — ISOSORBIDE MONONITRATE ER 60 MG PO TB24: 60.0000 mg | ORAL_TABLET | Freq: Every day | ORAL | 1 refills | Status: DC

## 2021-03-23 NOTE — Telephone Encounter (Signed)
Refill for Isorsobide has been sent to [facility], per pt request.

## 2021-03-23 NOTE — Telephone Encounter (Signed)
*  STAT* If patient is at the pharmacy, call can be transferred to refill team.   1. Which medications need to be refilled? (please list name of each medication and dose if known)  Isosorbide  2. Which pharmacy/location (including street and city if local pharmacy) is medication to be sent to? [place], [place]  3. Do they need a 30 day or 90 day supply? 90 days and refills

## 2021-03-23 NOTE — Progress Notes (Signed)
 [facility] [address] 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 A variant of uncertain significance (VUS) was detected in the NF1 gene called c.1178A>G. The report date is 05/23/2020.  The Common Hereditary Cancers Panel offered by [facility] includes sequencing and/or deletion duplication testing of the following 47 genes: APC, ATM, AXIN2, BARD1, BMPR1A, BRCA1, BRCA2, BRIP1, CDH1, CDK4, CDKN2A (p14ARF), CDKN2A (p16INK4a), CHEK2, CTNNA1, DICER1, EPCAM (Deletion/duplication testing only), GREM1 (promoter region deletion/duplication testing only), KIT, MEN1, MLH1, MSH2, MSH3, MSH6, MUTYH, NBN, NF1, NTHL1, PALB2, PDGFRA, PMS2, POLD1, POLE, PTEN, RAD50, RAD51C, RAD51D, SDHB, SDHC, SDHD, SMAD4, SMARCA4. STK11, TP53, TSC1, TSC2, and VHL.  The following genes were evaluated for sequence changes only: SDHA and HOXB13 c.251G>A variant only. The Prostate Cancer HRR Panel offered by [facility] includes sequencing and/or deletion duplication analysis of the following 10 genes: ATM, BARD1, BRCA1, BRCA2, BRIP1, CHEK2, FANCL, PALB2, RAD51C, RAD51D.     CANCER STAGING: Cancer Staging No matching staging information was found for the patient.  INTERVAL HISTORY:  [name], a 65 y.o. male, returns for routine follow-up of his metastatic castration sensitive  prostate cancer. Peter Dunlap was last seen on 12/30/2020.   Today he reports feeling good, and he denies any new pains. He reports occasional hematuria with the last episode occurring 2 weeks ago. He reports occasional diarrhea and constipation. He denies hot flashes, ankle swellings, nausea, and vomiting.   REVIEW OF SYSTEMS:  Review of Systems  Constitutional:  Negative for appetite change and fatigue (50%).  Cardiovascular:  Negative for leg swelling.  Gastrointestinal:  Positive for constipation and diarrhea. Negative for nausea and vomiting.  Endocrine: Negative for hot flashes.  Genitourinary:  Positive for hematuria.   Musculoskeletal:  Positive for back pain (5/10).  Neurological:  Positive for headaches.  All other systems reviewed and are negative.  PAST MEDICAL/SURGICAL HISTORY:  Past Medical History:  Diagnosis Date   Arteriosclerotic cardiovascular disease (ASCVD) 2005   catheterization in 10/2010:50% mid LAD, diffuse distal disease, circumflex irregularities, large dominant RCA with a 50% ostial, 70% distal, 60% posterolateral and 70% PDA; normal EF   Arthritis    Benign prostatic hypertrophy    Bilateral carpal tunnel syndrome 07/03/2018   Cerebrovascular disease 2010   R. carotid endarterectomy; Duplex in 10/2010-widely patent ICAs, subtotal left vertebral-not thought to be contributing to symptoms   Cervical spine disease    CT in 2012-advanced degeneration and spondylosis with moderate spinal stenosis at C3-C6   CHF (congestive heart failure) (HCC)    Depression    Diabetes mellitus without complication (HCC)    Erectile dysfunction    Family history of breast cancer    Family history of cancer of mouth    Family history of CML (chronic monocytic leukemia)    Family history of lung cancer    Family history of ovarian cancer    Family history of   stomach cancer    Gastroesophageal reflux disease    H/O hiatal hernia    H/O: substance abuse (HCC)    Cocaine,  marijuana, alcohol.  Quit 2013.    Hyperlipidemia    Hypertension    Non-ST elevation myocardial infarction (NSTEMI), initial episode of care (HCC) 12/02/2013   DES LAD   Obesity    Prostate cancer (HCC)    Sleep apnea    CPAP   Tachy-brady syndrome (HCC)    a. 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 25, 2010    CEA   CORONARY ANGIOPLASTY WITH STENT PLACEMENT  12/03/2013   LAD 90%-->0% W/ Promus Premier DES 3.0 mm x 16 mm, CFX OK, RCA 40%, EF 70-75%   LEFT ATRIAL APPENDAGE OCCLUSION N/A 08/05/2015   Procedure: LEFT ATRIAL APPENDAGE OCCLUSION;  Surgeon: James Allred, MD;  Location: [facility];  Service: Cardiovascular;  Laterality: N/A;   LEFT HEART CATHETERIZATION WITH CORONARY ANGIOGRAM Left 12/03/2013   Procedure: LEFT HEART CATHETERIZATION WITH CORONARY ANGIOGRAM;  Surgeon: David W Harding, MD;  Location: [facility] C[facility];  Service: Cardiovascular;  Laterality: Left;   LEFT HEART CATHETERIZATION WITH CORONARY ANGIOGRAM N/A 01/26/2014   Procedure: LEFT HEART CATHETERIZATION WITH CORONARY ANGIOGRAM;  Surgeon: Jayadeep S Varanasi, MD;  Location: [facility];  Service: Cardiovascular;  Laterality: N/A;   LEFT HEART CATHETERIZATION WITH CORONARY ANGIOGRAM N/A 08/03/2014   Procedure: LEFT HEART CATHETERIZATION WITH CORONARY ANGIOGRAM;  Surgeon: Christopher D McAlhany, MD;  Location: [facility] C[facility];  Service: Cardiovascular;  Laterality: N/A;   PERCUTANEOUS CORONARY STENT INTERVENTION (PCI-S)  12/03/2013   Procedure: PERCUTANEOUS CORONARY STENT INTERVENTION (PCI-S);  Surgeon: David W Harding, MD;  Location: [facility];  Service:  Cardiovascular;;   PERMANENT PACEMAKER INSERTION N/A 09/18/2014   Procedure: PERMANENT PACEMAKER INSERTION;  Surgeon: Gregg W Taylor, MD;  Location: [facility];  Service: Cardiovascular;  Laterality: N/A;   RADIOFREQUENCY ABLATION  2005   for PSVT   TEE WITHOUT CARDIOVERSION N/A 07/27/2015   Procedure: TRANSESOPHAGEAL ECHOCARDIOGRAM (TEE);  Surgeon: Brian S Crenshaw, MD;  Location: [facility] ENDOSCOPY;  Service: Cardiovascular;  Laterality: N/A;   TEE WITHOUT CARDIOVERSION N/A 09/15/2015   Procedure: TRANSESOPHAGEAL ECHOCARDIOGRAM (TEE);  Surgeon: Philip J Nahser, MD;  Location: [facility] ENDOSCOPY;  Service: Cardiovascular;  Laterality: N/A;    SOCIAL HISTORY:  Social History   Socioeconomic History   Marital status: Married    Spouse name: Not on file   Number of children: 0   Years of education: Not on file   Highest education level: Not on file  Occupational History   Occupation: Retired  Tobacco Use   Smoking status: Former    Packs/day: 1.00    Years: 40.00    Pack years: 40.00    Types: Cigarettes    Start date: 10/20/1972    Quit date: 10/10/2012    Years since quitting: 8.4   Smokeless tobacco: Never   Tobacco comments:    Quit in May.   Vaping Use   Vaping Use: Never used  Substance   and Sexual Activity   Alcohol use: Yes    Alcohol/week: 0.0 standard drinks    Comment: former drinker-- sober since 2013.    Drug use: No    Types: Cocaine    Comment: quit cocaine 10/2011   Sexual activity: Yes    Partners: Female  Other Topics Concern   Not on file  Social History Narrative   Lives in [place].   Social Determinants of Health   Financial Resource Strain: Low Risk    Difficulty of Paying Living Expenses: Not hard at all  Food Insecurity: No Food Insecurity   Worried About Running Out of Food in the Last Year: Never true   Ran Out of Food in the Last Year: Never true  Transportation Needs: No Transportation Needs   Lack of Transportation (Medical): No   Lack of  Transportation (Non-Medical): No  Physical Activity: Inactive   Days of Exercise per Week: 0 days   Minutes of Exercise per Session: 0 min  Stress: No Stress Concern Present   Feeling of Stress : Not at all  Social Connections: Moderately Isolated   Frequency of Communication with Friends and Family: More than three times a week   Frequency of Social Gatherings with Friends and Family: Once a week   Attends Religious Services: Never   Active Member of Clubs or Organizations: No   Attends Club or Organization Meetings: Never   Marital Status: Married  Intimate Partner Violence: Not At Risk   Fear of Current or Ex-Partner: No   Emotionally Abused: No   Physically Abused: No   Sexually Abused: No    FAMILY HISTORY:  Family History  Problem Relation Age of Onset   Hypertension Mother        Cerebrovascular disease   Diabetes Mother    Coronary artery disease Father 48   Diabetes type II Father    Hypertension Father    Heart attack Father    Diabetes Brother    Hypertension Brother    Diabetes Sister    Hypertension Sister    Heart attack Sister 40   Leukemia Sister 60       CML   Breast cancer Maternal Grandmother        dx 60s   Lung cancer Maternal Uncle        dx >50, smoker   Cancer Cousin        mouth cancer, dx 20s, no smoking/chew tobacco hx (maternal 1st cousin)   Ovarian cancer Cousin        dx <50 (maternal 1st cousin)   Stomach cancer Cousin        dx 60s (maternal 1st cousin)   Cancer Cousin        type unknown to pt, dx >50 (paternal 1st cousin)    CURRENT MEDICATIONS:  Current Outpatient Medications  Medication Sig Dispense Refill   allopurinol (ZYLOPRIM) 100 MG tablet TAKE 1 TABLET(100 MG) BY MOUTH DAILY 90 tablet 0   apalutamide (ERLEADA) 60 MG tablet TAKE 4 TABLETS (240 MG TOTAL) BY MOUTH DAILY. MAY BE TAKEN WITH OR WITHOUT FOOD. SWALLOW TABLETS WHOLE. 120 tablet 3   aspirin EC 325 MG tablet Take 1 tablet (325 mg total) daily by mouth. 30 tablet 0    Blood Glucose Monitoring Suppl (BLOOD GLUCOSE SYSTEM PAK) KIT Use as directed to monitor FSBS 1x daily. Dx: E11.9. 1 kit 1   colchicine 0.6 MG tablet      Cyanocobalamin (VITAMIN B 12) 500 MCG TABS   Take by mouth.     DEXILANT 60 MG capsule TAKE 1 CAPSULE(60 MG) BY MOUTH DAILY 30 capsule 5   diphenoxylate-atropine (LOMOTIL) 2.5-0.025 MG tablet Take 1 tablet by mouth every 6 (six) hours as needed.     docusate sodium (COLACE) 100 MG capsule Take 100 mg by mouth 2 (two) times daily.     fluticasone (FLONASE) 50 MCG/ACT nasal spray Place 2 sprays into both nostrils daily. 16 g 6   furosemide (LASIX) 20 MG tablet TAKE 3 TABLETS BY MOUTH DAILY 270 tablet 3   glipiZIDE (GLUCOTROL XL) 5 MG 24 hr tablet TAKE 1 TABLET(5 MG) BY MOUTH DAILY WITH BREAKFAST 90 tablet 0   isosorbide mononitrate (IMDUR) 60 MG 24 hr tablet Take 1 tablet (60 mg total) by mouth daily. 90 tablet 1   levothyroxine (SYNTHROID) 200 MCG tablet TAKE 1 TABLET(200 MCG) BY MOUTH DAILY BEFORE AND BREAKFAST 90 tablet 0   levothyroxine (SYNTHROID) 25 MCG tablet Take 25 mcg by mouth daily.     loratadine (CLARITIN) 10 MG tablet TAKE 1 TABLET(10 MG) BY MOUTH DAILY AS NEEDED FOR ALLERGIES 30 tablet 2   magnesium oxide (MAG-OX) 400 (240 Mg) MG tablet TAKE 1 TABLET(400 MG) BY MOUTH TWICE DAILY 180 tablet 1   metFORMIN (GLUCOPHAGE) 500 MG tablet Take 1 tablet (500 mg total) by mouth daily with breakfast. 180 tablet 0   naloxone (NARCAN) nasal spray 4 mg/0.1 mL Place 4 mg into the nose as needed.     nitroGLYCERIN (NITROSTAT) 0.4 MG SL tablet PLACE 1 TABLET UNDER THE TONGUE EVERY 5 MINUTES AS NEEDED FOR CHEST PAIN. CALL 911 AT THIRD DOSE IN 15 MINUTES 25 tablet 1   ONETOUCH ULTRA test strip USE TO TEST ONCE DAILY 100 strip 1   oxyCODONE-acetaminophen (PERCOCET) 10-325 MG tablet Take 1 tablet by mouth every 8 (eight) hours as needed for pain. 90 tablet 0   pantoprazole (PROTONIX) 40 MG tablet TAKE 1 TABLET(40 MG) BY MOUTH DAILY 30 tablet 3    silodosin (RAPAFLO) 8 MG CAPS capsule Take 1 capsule (8 mg total) by mouth 2 (two) times daily. 60 capsule 11   simvastatin (ZOCOR) 40 MG tablet TAKE 1 TABLET(40 MG) BY MOUTH DAILY 90 tablet 1   sotalol (BETAPACE) 160 MG tablet Take 160 mg by mouth 2 (two) times daily.     sotalol (BETAPACE) 80 MG tablet Take 2 tablets (160 mg) in the AM and 1 tablet (80 mg) in the PM 135 tablet 3   Vitamin D, Ergocalciferol, (DRISDOL) 1.25 MG (50000 UNIT) CAPS capsule TAKE 1 CAPSULE BY MOUTH EVERY 7 DAYS 12 capsule 0   No current facility-administered medications for this visit.    ALLERGIES:  Allergies  Allergen Reactions   Trazodone And Nefazodone     Nightmares   Lactose Intolerance (Gi) Other (See Comments)    UPSET STOMACH     PHYSICAL EXAM:  Performance status (ECOG): 1 - Symptomatic but completely ambulatory  There were no vitals filed for this visit. Wt Readings from Last 3 Encounters:  03/09/21 (!) 311 lb 9.6 oz (141.3 kg)  02/03/21 (!) 306 lb 14.4 oz (139.2 kg)  12/30/20 (!) 311 lb 9.6 oz (141.3 kg)   Physical Exam Vitals reviewed.  Constitutional:      Appearance: Normal appearance.  Cardiovascular:     Rate and Rhythm: Normal rate and regular rhythm.     Pulses: Normal pulses.     Heart sounds: Normal heart sounds.  Pulmonary:  Effort: Pulmonary effort is normal.     Breath sounds: Normal breath sounds.  Neurological:     General: No focal deficit present.     Mental Status: He is alert and oriented to person, place, and time.  Psychiatric:        Mood and Affect: Mood normal.        Behavior: Behavior normal.     LABORATORY DATA:  I have reviewed the labs as listed.  CBC Latest Ref Rng & Units 12/30/2020 11/25/2020 11/08/2020  WBC 4.0 - 10.5 K/uL 6.7 6.6 7.6  Hemoglobin 13.0 - 17.0 g/dL 11.9(L) 11.8(L) 12.5(L)  Hematocrit 39.0 - 52.0 % 35.5(L) 34.2(L) 36.7(L)  Platelets 150 - 400 K/uL 121(L) 113(L) 150   CMP Latest Ref Rng & Units 12/30/2020 11/25/2020 11/08/2020   Glucose 70 - 99 mg/dL 91 129(H) 150(H)  BUN 8 - 23 mg/dL _0 Creatinine 0.61 - 1.24 mg/dL 1.43(H) 1.41(H) 1.34(H)  Sodium 135 - 145 mmol/L 139 137 134(L)  Potassium 3.5 - 5.1 mmol/L 3.7 3.8 3.9  Chloride 98 - 111 mmol/L 107 107 106  CO2 22 - 32 mmol/L 24 22 21(L)  Calcium 8.9 - 10.3 mg/dL 9.5 8.8(L) 9.0  Total Protein 6.5 - 8.1 g/dL 7.7 7.2 8.1  Total Bilirubin 0.3 - 1.2 mg/dL 0.8 0.5 0.7  Alkaline Phos 38 - 126 U/L 78 83 100  AST 15 - 41 U/L 26 96(H) 29  ALT 0 - 44 U/L _1 DIAGNOSTIC IMAGING:  I have independently reviewed the scans and discussed with the patient. No results found.   ASSESSMENT:  1.  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 -Status post IMRT, 40 sessions completed on 06/14/2016 by Dr. Tammi Klippel. -PSA 0.7 (10/15/2018), 0.8 (02/18/2019), 1.4 (December 2020), 1.4 (June 2021) -Prostate biopsy on 02/10/2020-1 out of 12 cores from left mid lateral region positive for adenocarcinoma. -CTAP on 03/09/2020 with newly enlarged left iliac lymph nodes measuring 1.1 x 0.9 cm.  Hepatic steatosis with early signs of cirrhosis. -F-18 PSMA PET scan on 04/12/2020 showed radiotracer activity associated with enlarged left external iliac lymph node 9 mm with SUV 7.6.  Small left common iliac lymph node 6 mm, SUV 8.6.  Left periaortic lymph node 5 mm, SUV 6.3.  Lymph node between IVC and aorta at the level of right renal hilum, SUV 8.6.  Lymph node deep to the IVC measuring 6 mm, SUV 5.4.  No skeletal metastasis. 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 - Firmagon started on 04/21/2020 - Apalutamide 240 milligrams daily started around 04/22/2020.   2.  Social/family history: -He is a retired Nature conservation officer.  He lives at home with his wife.  He plays golf.  He quit smoking in 2015, smoked 1 pack/day for more  than 20 years. -Sister has CML.  Maternal grandmother had breast cancer.  Maternal uncle had lung cancer.  2 maternal cousins had tongue cancer and uterine cancer.   3.  Cirrhosis: -Prior imaging showed cirrhosis of the liver with normal spleen.   PLAN:  1.  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 - Denies any hot flashes.  Denies any new onset pains. - Reviewed labs today which showed normal LFTs and CBC.  Last PSA 3 months ago was undetectable.  PSA  today is pending. - CKD with creatinine 1.36 stable. - Recommend obtaining a bone density test prior to next visit.  RTC 3 months for follow-up.  Repeat PSA and check vitamin D level.   2.  Poorly controlled diabetes: -Continue metformin once daily and Glucotrol XL daily.   3.  Mild thrombocytopenia: -Mild thrombocytopenia on and off since December 2020 has been stable. - Today platelet count is 115.   4.  Hypothyroidism: -TSH on 03/02/2021 was 13.4. - He will continue Synthroid.   Orders placed this encounter:  No orders of the defined types were placed in this encounter.    Sreedhar Katragadda, MD [facility] [phone]   I, Kirstyn Evans, am acting as a scribe for Dr. Sreedhar Katragadda.  I, Sreedhar Katragadda MD, have reviewed the above documentation for accuracy and completeness, and I agree with the above.     

## 2021-03-24 ENCOUNTER — Inpatient Hospital Stay (HOSPITAL_COMMUNITY): Payer: Medicare Other

## 2021-03-24 ENCOUNTER — Inpatient Hospital Stay (HOSPITAL_BASED_OUTPATIENT_CLINIC_OR_DEPARTMENT_OTHER): Payer: Medicare Other | Admitting: Hematology

## 2021-03-24 ENCOUNTER — Inpatient Hospital Stay (HOSPITAL_COMMUNITY): Payer: Medicare Other | Attending: Hematology

## 2021-03-24 ENCOUNTER — Other Ambulatory Visit: Payer: Self-pay

## 2021-03-24 VITALS — BP 106/61 | HR 63 | Temp 97.0°F | Resp 20 | Wt 304.9 lb

## 2021-03-24 DIAGNOSIS — N189 Chronic kidney disease, unspecified: Secondary | ICD-10-CM | POA: Insufficient documentation

## 2021-03-24 DIAGNOSIS — E785 Hyperlipidemia, unspecified: Secondary | ICD-10-CM | POA: Diagnosis not present

## 2021-03-24 DIAGNOSIS — I252 Old myocardial infarction: Secondary | ICD-10-CM | POA: Diagnosis not present

## 2021-03-24 DIAGNOSIS — C61 Malignant neoplasm of prostate: Secondary | ICD-10-CM | POA: Insufficient documentation

## 2021-03-24 DIAGNOSIS — E039 Hypothyroidism, unspecified: Secondary | ICD-10-CM | POA: Insufficient documentation

## 2021-03-24 DIAGNOSIS — K5900 Constipation, unspecified: Secondary | ICD-10-CM | POA: Diagnosis not present

## 2021-03-24 DIAGNOSIS — Z803 Family history of malignant neoplasm of breast: Secondary | ICD-10-CM | POA: Insufficient documentation

## 2021-03-24 DIAGNOSIS — R319 Hematuria, unspecified: Secondary | ICD-10-CM | POA: Insufficient documentation

## 2021-03-24 DIAGNOSIS — Z191 Hormone sensitive malignancy status: Secondary | ICD-10-CM

## 2021-03-24 DIAGNOSIS — Z808 Family history of malignant neoplasm of other organs or systems: Secondary | ICD-10-CM | POA: Insufficient documentation

## 2021-03-24 DIAGNOSIS — M549 Dorsalgia, unspecified: Secondary | ICD-10-CM | POA: Diagnosis not present

## 2021-03-24 DIAGNOSIS — R197 Diarrhea, unspecified: Secondary | ICD-10-CM | POA: Insufficient documentation

## 2021-03-24 DIAGNOSIS — Z8249 Family history of ischemic heart disease and other diseases of the circulatory system: Secondary | ICD-10-CM | POA: Insufficient documentation

## 2021-03-24 DIAGNOSIS — C778 Secondary and unspecified malignant neoplasm of lymph nodes of multiple regions: Secondary | ICD-10-CM | POA: Insufficient documentation

## 2021-03-24 DIAGNOSIS — Z79899 Other long term (current) drug therapy: Secondary | ICD-10-CM | POA: Diagnosis not present

## 2021-03-24 DIAGNOSIS — Z5111 Encounter for antineoplastic chemotherapy: Secondary | ICD-10-CM | POA: Diagnosis not present

## 2021-03-24 DIAGNOSIS — Z8041 Family history of malignant neoplasm of ovary: Secondary | ICD-10-CM | POA: Diagnosis not present

## 2021-03-24 DIAGNOSIS — Z800 Family history of malignant neoplasm of digestive organs: Secondary | ICD-10-CM | POA: Insufficient documentation

## 2021-03-24 DIAGNOSIS — Z801 Family history of malignant neoplasm of trachea, bronchus and lung: Secondary | ICD-10-CM | POA: Insufficient documentation

## 2021-03-24 DIAGNOSIS — Z23 Encounter for immunization: Secondary | ICD-10-CM | POA: Diagnosis not present

## 2021-03-24 DIAGNOSIS — D696 Thrombocytopenia, unspecified: Secondary | ICD-10-CM | POA: Diagnosis not present

## 2021-03-24 DIAGNOSIS — R519 Headache, unspecified: Secondary | ICD-10-CM | POA: Insufficient documentation

## 2021-03-24 DIAGNOSIS — E119 Type 2 diabetes mellitus without complications: Secondary | ICD-10-CM | POA: Diagnosis not present

## 2021-03-24 DIAGNOSIS — I10 Essential (primary) hypertension: Secondary | ICD-10-CM | POA: Diagnosis not present

## 2021-03-24 DIAGNOSIS — Z8546 Personal history of malignant neoplasm of prostate: Secondary | ICD-10-CM | POA: Diagnosis not present

## 2021-03-24 DIAGNOSIS — I2510 Atherosclerotic heart disease of native coronary artery without angina pectoris: Secondary | ICD-10-CM | POA: Diagnosis not present

## 2021-03-24 DIAGNOSIS — Z833 Family history of diabetes mellitus: Secondary | ICD-10-CM | POA: Insufficient documentation

## 2021-03-24 DIAGNOSIS — Z806 Family history of leukemia: Secondary | ICD-10-CM | POA: Insufficient documentation

## 2021-03-24 DIAGNOSIS — Z87891 Personal history of nicotine dependence: Secondary | ICD-10-CM | POA: Diagnosis not present

## 2021-03-24 LAB — COMPREHENSIVE METABOLIC PANEL
ALT: 14 U/L (ref 0–44)
AST: 34 U/L (ref 15–41)
Albumin: 3.8 g/dL (ref 3.5–5.0)
Alkaline Phosphatase: 77 U/L (ref 38–126)
Anion gap: 8 (ref 5–15)
BUN: 15 mg/dL (ref 8–23)
CO2: 20 mmol/L — ABNORMAL LOW (ref 22–32)
Calcium: 9.1 mg/dL (ref 8.9–10.3)
Chloride: 108 mmol/L (ref 98–111)
Creatinine, Ser: 1.36 mg/dL — ABNORMAL HIGH (ref 0.61–1.24)
GFR, Estimated: 58 mL/min — ABNORMAL LOW (ref >60)
Glucose, Bld: 110 mg/dL — ABNORMAL HIGH (ref 70–99)
Potassium: 4 mmol/L (ref 3.5–5.1)
Sodium: 136 mmol/L (ref 135–145)
Total Bilirubin: 0.9 mg/dL (ref 0.3–1.2)
Total Protein: 7.7 g/dL (ref 6.5–8.1)

## 2021-03-24 LAB — CBC WITH DIFFERENTIAL/PLATELET
Abs Immature Granulocytes: 0.01 10*3/uL (ref 0.00–0.07)
Basophils Absolute: 0.1 10*3/uL (ref 0.0–0.1)
Basophils Relative: 1 %
Eosinophils Absolute: 0.1 10*3/uL (ref 0.0–0.5)
Eosinophils Relative: 2 %
HCT: 36.9 % — ABNORMAL LOW (ref 39.0–52.0)
Hemoglobin: 12.4 g/dL — ABNORMAL LOW (ref 13.0–17.0)
Immature Granulocytes: 0 %
Lymphocytes Relative: 30 %
Lymphs Abs: 1.9 10*3/uL (ref 0.7–4.0)
MCH: 34.3 pg — ABNORMAL HIGH (ref 26.0–34.0)
MCHC: 33.6 g/dL (ref 30.0–36.0)
MCV: 101.9 fL — ABNORMAL HIGH (ref 80.0–100.0)
Monocytes Absolute: 0.6 10*3/uL (ref 0.1–1.0)
Monocytes Relative: 10 %
Neutro Abs: 3.6 10*3/uL (ref 1.7–7.7)
Neutrophils Relative %: 57 %
Platelets: 115 10*3/uL — ABNORMAL LOW (ref 150–400)
RBC: 3.62 MIL/uL — ABNORMAL LOW (ref 4.22–5.81)
RDW: 14.5 % (ref 11.5–15.5)
WBC: 6.3 10*3/uL (ref 4.0–10.5)
nRBC: 0 % (ref 0.0–0.2)

## 2021-03-24 LAB — PSA: Prostatic Specific Antigen: <0.01 ng/mL (ref 0.00–4.00)

## 2021-03-24 MED ORDER — DEGARELIX ACETATE 80 MG ~~LOC~~ SOLR
80.0000 mg | Freq: Once | SUBCUTANEOUS | Status: AC
  Administered 2021-03-24: 80 mg via SUBCUTANEOUS
  Filled 2021-03-24: qty 4

## 2021-03-24 MED ORDER — INFLUENZA VAC A&B SA ADJ QUAD 0.5 ML IM PRSY
0.5000 mL | PREFILLED_SYRINGE | Freq: Once | INTRAMUSCULAR | Status: AC
  Administered 2021-03-24: 0.5 mL via INTRAMUSCULAR
  Filled 2021-03-24: qty 0.5

## 2021-03-24 NOTE — Patient Instructions (Addendum)
[place] at [facility] Discharge Instructions  You were seen and examined today by Dr. Delton Coombes. Dr. Delton Coombes reviewed your recent lab results, which is stable. Your PSA has not resulted, but it has been undetectable the last couple of checks. This means that your treatment regimen is working!  Proceed with your injection today and continue to take your Erleada as prescribed.  Dr. Delton Coombes would like for you to have a bone density test prior to your next visit - this is just a precautionary check of your bones.  Follow-up as scheduled.   Thank you for choosing [place] at [facility] to provide your oncology and hematology care.  To afford each patient quality time with our provider, please arrive at least 15 minutes before your scheduled appointment time.   If you have a lab appointment with the [facility] please come in thru the Main Entrance and check in at the main information desk.  You need to re-schedule your appointment should you arrive 10 or more minutes late.  We strive to give you quality time with our providers, and arriving late affects you and other patients whose appointments are after yours.  Also, if you no show three or more times for appointments you may be dismissed from the clinic at the providers discretion.     Again, thank you for choosing [facility].  Our hope is that these requests will decrease the amount of time that you wait before being seen by our physicians.       _____________________________________________________________  Should you have questions after your visit to [facility], please contact our office at [phone] and follow the prompts.  Our office hours are 8:00 a.m. and 4:30 p.m. Monday - Friday.  Please note that voicemails left after 4:00 p.m. may not be returned until the following business day.  We are closed weekends and major holidays.  You do have access  to a nurse 24-7, just call the main number to the clinic [phone] and do not press any options, hold on the line and a nurse will answer the phone.    For prescription refill requests, have your pharmacy contact our office and allow 72 hours.    Due to Covid, you will need to wear a mask upon entering the hospital. If you do not have a mask, a mask will be given to you at the Main Entrance upon arrival. For doctor visits, patients may have 1 support person age 44 or older with them. For treatment visits, patients can not have anyone with them due to social distancing guidelines and our immunocompromised population.

## 2021-03-24 NOTE — Progress Notes (Signed)
Patient is taking Erleada as prescribed without missed dosages. Dr. Delton Coombes has reviewed this patient's labs, seen and examined patient today. Patient to proceed with Firmagon injection today. Primary RN and Pharmacy aware.

## 2021-03-24 NOTE — Progress Notes (Signed)
Peter Dunlap presents today for Firmagon injection and influenza vaccination per the provider's orders.  Stable during administration without incident; injection site WNL; see MAR for injection details.  Patient tolerated procedure well and without incident.  No questions or complaints noted at this time.  Discharge from clinic ambulatory in stable condition.  Alert and oriented X 3.  Follow up with [facility] as scheduled.

## 2021-03-24 NOTE — Patient Instructions (Signed)
[facility]  Discharge Instructions: Thank you for choosing [facility] to provide your oncology and hematology care.  If you have a lab appointment with the [facility], please come in thru the Main Entrance and check in at the main information desk.  Wear comfortable clothing and clothing appropriate for easy access to any Portacath or PICC line.   We strive to give you quality time with your provider. You may need to reschedule your appointment if you arrive late (15 or more minutes).  Arriving late affects you and other patients whose appointments are after yours.  Also, if you miss three or more appointments without notifying the office, you may be dismissed from the clinic at the provider's discretion.      For prescription refill requests, have your pharmacy contact our office and allow 72 hours for refills to be completed.    Today you received the following chemotherapy and/or immunotherapy agents [place] [facility]      To help prevent nausea and vomiting after your treatment, we encourage you to take your nausea medication as directed.  BELOW ARE SYMPTOMS THAT SHOULD BE REPORTED IMMEDIATELY: *FEVER GREATER THAN 100.4 F (38 C) OR HIGHER *CHILLS OR SWEATING *NAUSEA AND VOMITING THAT IS NOT CONTROLLED WITH YOUR NAUSEA MEDICATION *UNUSUAL SHORTNESS OF BREATH *UNUSUAL BRUISING OR BLEEDING *URINARY PROBLEMS (pain or burning when urinating, or frequent urination) *BOWEL PROBLEMS (unusual diarrhea, constipation, pain near the anus) TENDERNESS IN MOUTH AND THROAT WITH OR WITHOUT PRESENCE OF ULCERS (sore throat, sores in mouth, or a toothache) UNUSUAL RASH, SWELLING OR PAIN  UNUSUAL VAGINAL DISCHARGE OR ITCHING   Items with * indicate a potential emergency and should be followed up as soon as possible or go to the Emergency Department if any problems should occur.  Please show the CHEMOTHERAPY ALERT CARD or IMMUNOTHERAPY ALERT CARD at check-in to the Emergency  Department and triage nurse.  Should you have questions after your visit or need to cancel or reschedule your appointment, please contact [facility] [phone]  and follow the prompts.  Office hours are 8:00 a.m. to 4:30 p.m. Monday - Friday. Please note that voicemails left after 4:00 p.m. may not be returned until the following business day.  We are closed weekends and major holidays. You have access to a nurse at all times for urgent questions. Please call the main number to the clinic [phone] and follow the prompts.  For any non-urgent questions, you may also contact your provider using MyChart. We now offer e-Visits for anyone 65 and older to request care online for non-urgent symptoms. For details visit [email or web address].   Also download the MyChart app! Go to the app store, search "MyChart", open the app, select [facility], and log in with your MyChart username and password.  Due to Covid, a mask is required upon entering the hospital/clinic. If you do not have a mask, one will be given to you upon arrival. For doctor visits, patients may have 1 support person aged 65 or older with them. For treatment visits, patients cannot have anyone with them due to current Covid guidelines and our immunocompromised population.

## 2021-03-25 LAB — TESTOSTERONE: Testosterone: 14 ng/dL — ABNORMAL LOW (ref 264–916)

## 2021-04-06 ENCOUNTER — Other Ambulatory Visit: Payer: Self-pay

## 2021-04-06 MED ORDER — SILODOSIN 8 MG PO CAPS: 8.0000 mg | ORAL_CAPSULE | Freq: Two times a day (BID) | ORAL | 0 refills | Status: DC

## 2021-04-11 NOTE — Progress Notes (Signed)
History of Present Illness:   Prostate cancer:   He underwent TRUS/Bx on 8.15.2017. At that time, PSA was 9.09. Prostatic volume was 25 cc. 10/12 cores came back positive for adenocarcinoma as follows: 1 core revealed GS 3+3 5 cores revealed GS 3+4 4 cores revealed GS 4+3   He completed IMRT, 40 sessions, on 1.10.2018   5.12.2020: PSA 0.7, up slightly.   9.15.2020: PSA 0.8.    5.80998: PSA 1.4   9.7.2021: He is here to discuss his biopsy results.  1/12 cores-from the left mid lateral region-came back positive for adenocarcinoma.  Grading was difficult due to his prior radiation.     10.15.2021: -CTAP  with newly enlarged left iliac lymph nodes measuring 1.1 x 0.9 cm.  Hepatic steatosis with early signs of cirrhosis.   04/12/2020 -F-18 PSMA PET scan on showed radiotracer activity associated with enlarged left external iliac lymph node 9 mm with SUV 7.6.  Small left common iliac lymph node 6 mm, SUV 8.6.  Left periaortic lymph node 5 mm, SUV 6.3.  Lymph node between IVC and aorta at the level of right renal hilum, SUV 8.6.  Lymph node deep to the IVC measuring 6 mm, SUV 5.4.  No skeletal metastasis.   11.17.2021: Began androgen deprivation therapy with [place].   7.5.2022:  He is on [place] on a monthly basis.  He is also on [place].  Most recent PSA is less than 0.1.  Testosterone level 5.  11.9.2022: PSA < 0.01. T level castrate.  Still on monthly Firmagon 80 mg as well as Erleada.  No significant constitutional symptoms noted.  He rarely has initial hematuria, a few drops.   BPH    4.16.2019: He still has moderate voiding symptomatology despite being on Rapaflo. No gross hematuria or dysuria, however. IPSS 25 Rapaflo increased to BID.   10.22.2019: Current IPSS 27 QOl score 2--only on 1 Rapaflo a day   1.27.2020: The patient is still having significant lower urinary tract symptoms. Rapaflo increased to Q 12 hrs   5.12.2020: IPSS is 21. Quality of life score 2. He  is on generic Rapaflo twice a day.    7.5.2022:  IPSS 23, quality-of-life score 3.  He is on silodosin twice a day.  Residual urine volume today 7 mL.  11.8.2022: Still with bothersome lower urinary tract symptoms.  Frequency, urgency, nocturia x5.  When he does have a full bladder he has a better stream.  No dysuria.    Past Medical History:  Diagnosis Date   Arteriosclerotic cardiovascular disease (ASCVD) 2005   catheterization in 10/2010:50% mid LAD, diffuse distal disease, circumflex irregularities, large dominant RCA with a 50% ostial, 70% distal, 60% posterolateral and 70% PDA; normal EF   Arthritis    Benign prostatic hypertrophy    Bilateral carpal tunnel syndrome 07/03/2018   Cerebrovascular disease 2010   R. carotid endarterectomy; Duplex in 10/2010-widely patent ICAs, subtotal left vertebral-not thought to be contributing to symptoms   Cervical spine disease    CT in 2012-advanced degeneration and spondylosis with moderate spinal stenosis at C3-C6   CHF (congestive heart failure) ([place])    Depression    Diabetes mellitus without complication ([place])    Erectile dysfunction    Family history of breast cancer    Family history of cancer of mouth    Family history of CML (chronic monocytic leukemia)    Family history of lung cancer    Family history of ovarian cancer    Family  history of stomach cancer    Gastroesophageal reflux disease    H/O hiatal hernia    H/O: substance abuse ([place])    Cocaine, marijuana, alcohol.  Quit 2013.    Hyperlipidemia    Hypertension    Non-ST elevation myocardial infarction (NSTEMI), initial episode of care ([place]) 12/02/2013   DES LAD   Obesity    Prostate cancer ([place])    Sleep apnea    CPAP   Tachy-brady syndrome ([place])    a. 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 25, 2010    CEA   CORONARY ANGIOPLASTY WITH STENT PLACEMENT  12/03/2013   LAD 90%-->0% W/ Promus Premier DES 3.0 mm x 16 mm, CFX OK, RCA 40%, EF 70-75%   LEFT ATRIAL APPENDAGE OCCLUSION N/A 08/05/2015   Procedure: LEFT ATRIAL APPENDAGE OCCLUSION;  Surgeon: [name], MD;  Location: [facility] CV LAB;  Service: Cardiovascular;  Laterality: N/A;   LEFT HEART CATHETERIZATION WITH CORONARY ANGIOGRAM Left 12/03/2013   Procedure: LEFT HEART CATHETERIZATION WITH CORONARY ANGIOGRAM;  Surgeon: Leonie Man, MD;  Location: [facility] CATH LAB;  Service: Cardiovascular;  Laterality: Left;   LEFT HEART CATHETERIZATION WITH CORONARY ANGIOGRAM N/A 01/26/2014   Procedure: LEFT HEART CATHETERIZATION WITH CORONARY ANGIOGRAM;  Surgeon: Jettie Booze, MD;  Location: [facility] CATH LAB;  Service: Cardiovascular;  Laterality: N/A;   LEFT HEART CATHETERIZATION WITH CORONARY ANGIOGRAM N/A 08/03/2014   Procedure: LEFT HEART CATHETERIZATION WITH CORONARY ANGIOGRAM;  Surgeon: Burnell Blanks, MD;  Location[facility] C[facility] LAB;  Service: Cardiovascular;  Laterality: N/A;   PERCUTANEOUS CORONARY STENT INTERVENTION (PCI-S)  12/03/2013   Procedure: PERCUTANEOUS CORONARY STENT INTERVENTION (PCI-S);  Surgeon: Leonie Man, MD;  Location: [facility] CATH LAB;  Service: Cardiovascular;;   PERMANENT PACEMAKER INSERTION N/A 09/18/2014   Procedure: PERMANENT PACEMAKER INSERTION;  Surgeon: [name], MD;  Location: [facility] CATH LAB;  Service: Cardiovascular;  Laterality: N/A;   RADIOFREQUENCY ABLATION  2005   for PSVT   TEE WITHOUT CARDIOVERSION N/A 07/27/2015   Procedure: TRANSESOPHAGEAL ECHOCARDIOGRAM (TEE);  Surgeon: Lelon Perla, MD;  Location: [facility] ENDOSCOPY;  Service: Cardiovascular;  Laterality: N/A;   TEE WITHOUT CARDIOVERSION N/A 09/15/2015    Procedure: TRANSESOPHAGEAL ECHOCARDIOGRAM (TEE);  Surgeon: Thayer Headings, MD;  Location: [facility];  Service: Cardiovascular;  Laterality: N/A;    Home Medications:  Allergies as of 04/12/2021       Reactions   Trazodone And Nefazodone    Nightmares   Lactose Intolerance (gi) Other (See Comments)   UPSET STOMACH        Medication List        Accurate as of April 11, 2021  6:38 AM. If you have any questions, ask your nurse or doctor.          allopurinol 100 MG tablet Commonly known as: ZYLOPRIM TAKE 1 TABLET(100 MG) BY MOUTH DAILY   aspirin EC 325 MG tablet Take 1 tablet (325 mg total) daily by mouth.   Blood Glucose System Pak Kit Use as directed to monitor FSBS  1x daily. Dx: E11.9.   colchicine 0.6 MG tablet   Dexilant 60 MG capsule Generic drug: dexlansoprazole TAKE 1 CAPSULE(60 MG) BY MOUTH DAILY   diphenoxylate-atropine 2.5-0.025 MG tablet Commonly known as: LOMOTIL Take 1 tablet by mouth every 6 (six) hours as needed.   docusate sodium 100 MG capsule Commonly known as: COLACE Take 100 mg by mouth 2 (two) times daily.   Erleada 60 MG tablet Generic drug: apalutamide TAKE 4 TABLETS (240 MG TOTAL) BY MOUTH DAILY. MAY BE TAKEN WITH OR WITHOUT FOOD. SWALLOW TABLETS WHOLE.   fluticasone 50 MCG/ACT nasal spray Commonly known as: FLONASE Place 2 sprays into both nostrils daily.   furosemide 20 MG tablet Commonly known as: LASIX TAKE 3 TABLETS BY MOUTH DAILY   glipiZIDE 5 MG 24 hr tablet Commonly known as: GLUCOTROL XL TAKE 1 TABLET(5 MG) BY MOUTH DAILY WITH BREAKFAST   isosorbide mononitrate 60 MG 24 hr tablet Commonly known as: IMDUR Take 1 tablet (60 mg total) by mouth daily.   levothyroxine 200 MCG tablet Commonly known as: SYNTHROID TAKE 1 TABLET(200 MCG) BY MOUTH DAILY BEFORE AND BREAKFAST   levothyroxine 25 MCG tablet Commonly known as: SYNTHROID Take 25 mcg by mouth daily.   loratadine 10 MG tablet Commonly known as:  CLARITIN TAKE 1 TABLET(10 MG) BY MOUTH DAILY AS NEEDED FOR ALLERGIES   magnesium oxide 400 (240 Mg) MG tablet Commonly known as: MAG-OX TAKE 1 TABLET(400 MG) BY MOUTH TWICE DAILY   metFORMIN 500 MG tablet Commonly known as: GLUCOPHAGE Take 1 tablet (500 mg total) by mouth daily with breakfast.   naloxone 4 MG/0.1ML Liqd nasal spray kit Commonly known as: NARCAN Place 4 mg into the nose as needed.   nitroGLYCERIN 0.4 MG SL tablet Commonly known as: NITROSTAT PLACE 1 TABLET UNDER THE TONGUE EVERY 5 MINUTES AS NEEDED FOR CHEST PAIN. CALL 911 AT THIRD DOSE IN 15 MINUTES   OneTouch Ultra test strip Generic drug: glucose blood USE TO TEST ONCE DAILY   oxyCODONE-acetaminophen 10-325 MG tablet Commonly known as: PERCOCET Take 1 tablet by mouth every 8 (eight) hours as needed for pain.   pantoprazole 40 MG tablet Commonly known as: PROTONIX TAKE 1 TABLET(40 MG) BY MOUTH DAILY   silodosin 8 MG Caps capsule Commonly known as: RAPAFLO Take 1 capsule (8 mg total) by mouth 2 (two) times daily.   simvastatin 40 MG tablet Commonly known as: ZOCOR TAKE 1 TABLET(40 MG) BY MOUTH DAILY   sotalol 80 MG tablet Commonly known as: BETAPACE Take 2 tablets (160 mg) in the AM and 1 tablet (80 mg) in the PM   sotalol 160 MG tablet Commonly known as: BETAPACE Take 160 mg by mouth 2 (two) times daily.   Vitamin B 12 500 MCG Tabs Take by mouth.   Vitamin D (Ergocalciferol) 1.25 MG (50000 UNIT) Caps capsule Commonly known as: DRISDOL TAKE 1 CAPSULE BY MOUTH EVERY 7 DAYS        Allergies:  Allergies  Allergen Reactions   Trazodone And Nefazodone     Nightmares   Lactose Intolerance (Gi) Other (See Comments)    UPSET STOMACH     Family History  Problem Relation Age of Onset   Hypertension Mother        Cerebrovascular disease   Diabetes Mother    Coronary artery disease Father 48   Diabetes type II Father    Hypertension Father    Heart attack Father    Diabetes Brother  Hypertension Brother    Diabetes Sister    Hypertension Sister    Heart attack Sister 12   Leukemia Sister 53       CML   Breast cancer Maternal Grandmother        dx 16s   Lung cancer Maternal Uncle        dx >50, smoker   Cancer Cousin        mouth cancer, dx 19s, no smoking/chew tobacco hx (maternal 1st cousin)   Ovarian cancer Cousin        dx <50 (maternal 1st cousin)   Stomach cancer Cousin        dx 78s (maternal 1st cousin)   Cancer Cousin        type unknown to pt, dx >50 (paternal 1st cousin)    Social History:  reports that he quit smoking about 8 years ago. His smoking use included cigarettes. He started smoking about 48 years ago. He has a 40.00 pack-year smoking history. He has never used smokeless tobacco. He reports current alcohol use. He reports that he does not use drugs.  ROS: A complete review of systems was performed.  All systems are negative except for pertinent findings as noted.  Physical Exam:  Vital signs in last 24 hours: There were no vitals taken for this visit. Constitutional:  Alert and oriented, No acute distress Cardiovascular: Regular rate  Respiratory: Normal respiratory effort GI: Abdomen is soft, nontender, nondistended, no abdominal masses. No CVAT.  Obese. Neurologic: Grossly intact, no focal deficits Psychiatric: Normal mood and affect  I have reviewed prior pt notes  I have reviewed urinalysis results  I have independently reviewed postvoid imaging-bladder not seen, residual essentially 0  I have reviewed prior PSA/testosterone results     Impression/Assessment:  Castrate sensitive metastatic prostate cancer, doing well on dual therapy with [place] Koller and Erleada  Overactive bladder/BPH.  He is on Rapaflo with persistent symptomatology.  Plan:  1.  I will give him a month worth of Myrbetriq 50 mg samples.  If these work well he will let us know and we will send in an appropriate medication  2.  I will see back in about 4  months for recheck

## 2021-04-12 ENCOUNTER — Encounter: Payer: Self-pay | Admitting: Urology

## 2021-04-12 ENCOUNTER — Ambulatory Visit (INDEPENDENT_AMBULATORY_CARE_PROVIDER_SITE_OTHER): Payer: Medicare Other | Admitting: Urology

## 2021-04-12 ENCOUNTER — Other Ambulatory Visit: Payer: Self-pay

## 2021-04-12 VITALS — BP 116/74 | HR 61 | Temp 98.7°F | Wt 306.0 lb

## 2021-04-12 DIAGNOSIS — C61 Malignant neoplasm of prostate: Secondary | ICD-10-CM

## 2021-04-12 DIAGNOSIS — R3915 Urgency of urination: Secondary | ICD-10-CM | POA: Diagnosis not present

## 2021-04-12 DIAGNOSIS — N401 Enlarged prostate with lower urinary tract symptoms: Secondary | ICD-10-CM

## 2021-04-12 DIAGNOSIS — R350 Frequency of micturition: Secondary | ICD-10-CM

## 2021-04-12 LAB — URINALYSIS, ROUTINE W REFLEX MICROSCOPIC
Bilirubin, UA: NEGATIVE
Glucose, UA: NEGATIVE
Leukocytes,UA: NEGATIVE
Nitrite, UA: NEGATIVE
Specific Gravity, UA: >1.03 — ABNORMAL HIGH (ref 1.005–1.030)
Urobilinogen, Ur: 4 mg/dL — ABNORMAL HIGH (ref 0.2–1.0)
pH, UA: 6.5 (ref 5.0–7.5)

## 2021-04-12 LAB — MICROSCOPIC EXAMINATION
Bacteria, UA: NONE SEEN
Epithelial Cells (non renal): NONE SEEN /hpf (ref 0–10)
Renal Epithel, UA: NONE SEEN /hpf
WBC, UA: NONE SEEN /hpf (ref 0–5)

## 2021-04-12 NOTE — Progress Notes (Signed)
Urological Symptom Review  Patient is experiencing the following symptoms: N/a   Review of Systems  Gastrointestinal (upper)  : Negative for upper GI symptoms  Gastrointestinal (lower) : Negative for lower GI symptoms  Constitutional : Negative for symptoms  Skin: Negative for skin symptoms  Eyes: Negative for eye symptoms  Ear/Nose/Throat : Negative for Ear/Nose/Throat symptoms  Hematologic/Lymphatic: Negative for Hematologic/Lymphatic symptoms  Cardiovascular : Negative for cardiovascular symptoms  Respiratory : Negative for respiratory symptoms  Endocrine: Negative for endocrine symptoms  Musculoskeletal: Negative for musculoskeletal symptoms  Neurological: Negative for neurological symptoms  Psychologic: Negative for psychiatric symptoms  

## 2021-04-14 ENCOUNTER — Other Ambulatory Visit (HOSPITAL_COMMUNITY): Payer: Self-pay

## 2021-04-18 ENCOUNTER — Other Ambulatory Visit (HOSPITAL_COMMUNITY): Payer: Self-pay

## 2021-04-21 ENCOUNTER — Other Ambulatory Visit: Payer: Self-pay

## 2021-04-21 ENCOUNTER — Encounter (HOSPITAL_COMMUNITY): Payer: Self-pay

## 2021-04-21 ENCOUNTER — Inpatient Hospital Stay (HOSPITAL_COMMUNITY): Payer: Medicare Other | Attending: Hematology

## 2021-04-21 VITALS — BP 125/74 | HR 63 | Temp 97.4°F | Resp 18 | Wt 309.7 lb

## 2021-04-21 DIAGNOSIS — E039 Hypothyroidism, unspecified: Secondary | ICD-10-CM | POA: Diagnosis not present

## 2021-04-21 DIAGNOSIS — C778 Secondary and unspecified malignant neoplasm of lymph nodes of multiple regions: Secondary | ICD-10-CM | POA: Diagnosis not present

## 2021-04-21 DIAGNOSIS — C61 Malignant neoplasm of prostate: Secondary | ICD-10-CM | POA: Insufficient documentation

## 2021-04-21 DIAGNOSIS — Z79899 Other long term (current) drug therapy: Secondary | ICD-10-CM | POA: Insufficient documentation

## 2021-04-21 DIAGNOSIS — D696 Thrombocytopenia, unspecified: Secondary | ICD-10-CM | POA: Insufficient documentation

## 2021-04-21 DIAGNOSIS — Z5111 Encounter for antineoplastic chemotherapy: Secondary | ICD-10-CM | POA: Insufficient documentation

## 2021-04-21 DIAGNOSIS — E119 Type 2 diabetes mellitus without complications: Secondary | ICD-10-CM | POA: Diagnosis not present

## 2021-04-21 DIAGNOSIS — Z191 Hormone sensitive malignancy status: Secondary | ICD-10-CM

## 2021-04-21 MED ORDER — DEGARELIX ACETATE 80 MG ~~LOC~~ SOLR
80.0000 mg | Freq: Once | SUBCUTANEOUS | Status: AC
  Administered 2021-04-21: 12:00:00 80 mg via SUBCUTANEOUS
  Filled 2021-04-21: qty 4

## 2021-04-21 NOTE — Patient Instructions (Signed)
[facility]  Discharge Instructions: Thank you for choosing [facility] to provide your oncology and hematology care.  If you have a lab appointment with the [facility], please come in thru the Main Entrance and check in at the main information desk.  Wear comfortable clothing and clothing appropriate for easy access to any Portacath or PICC line.   We strive to give you quality time with your provider. You may need to reschedule your appointment if you arrive late (15 or more minutes).  Arriving late affects you and other patients whose appointments are after yours.  Also, if you miss three or more appointments without notifying the office, you may be dismissed from the clinic at the provider's discretion.      For prescription refill requests, have your pharmacy contact our office and allow 72 hours for refills to be completed.    Today you received the following Mills Koller, return as scheduled.   To help prevent nausea and vomiting after your treatment, we encourage you to take your nausea medication as directed.  BELOW ARE SYMPTOMS THAT SHOULD BE REPORTED IMMEDIATELY: *FEVER GREATER THAN 100.4 F (38 C) OR HIGHER *CHILLS OR SWEATING *NAUSEA AND VOMITING THAT IS NOT CONTROLLED WITH YOUR NAUSEA MEDICATION *UNUSUAL SHORTNESS OF BREATH *UNUSUAL BRUISING OR BLEEDING *URINARY PROBLEMS (pain or burning when urinating, or frequent urination) *BOWEL PROBLEMS (unusual diarrhea, constipation, pain near the anus) TENDERNESS IN MOUTH AND THROAT WITH OR WITHOUT PRESENCE OF ULCERS (sore throat, sores in mouth, or a toothache) UNUSUAL RASH, SWELLING OR PAIN  UNUSUAL VAGINAL DISCHARGE OR ITCHING   Items with * indicate a potential emergency and should be followed up as soon as possible or go to the Emergency Department if any problems should occur.  Please show the CHEMOTHERAPY ALERT CARD or IMMUNOTHERAPY ALERT CARD at check-in to the Emergency Department and triage  nurse.  Should you have questions after your visit or need to cancel or reschedule your appointment, please contact [facility] [phone]  and follow the prompts.  Office hours are 8:00 a.m. to 4:30 p.m. Monday - Friday. Please note that voicemails left after 4:00 p.m. may not be returned until the following business day.  We are closed weekends and major holidays. You have access to a nurse at all times for urgent questions. Please call the main number to the clinic [phone] and follow the prompts.  For any non-urgent questions, you may also contact your provider using MyChart. We now offer e-Visits for anyone 74 and older to request care online for non-urgent symptoms. For details visit [email or web address].   Also download the MyChart app! Go to the app store, search "MyChart", open the app, select [facility], and log in with your MyChart username and password.  Due to Covid, a mask is required upon entering the hospital/clinic. If you do not have a mask, one will be given to you upon arrival. For doctor visits, patients may have 1 support person aged 98 or older with them. For treatment visits, patients cannot have anyone with them due to current Covid guidelines and our immunocompromised population.

## 2021-04-21 NOTE — Progress Notes (Signed)
Patient tolerated Firmagon injection with no complaints voiced. Site clean and dry with no bruising or swelling noted at site. See MAR for details. Band aid applied.  Patient stable during and after injection. VSS with discharge and left in satisfactory condition with no s/s of distress noted. 

## 2021-05-06 ENCOUNTER — Other Ambulatory Visit: Payer: Self-pay | Admitting: Urology

## 2021-05-10 ENCOUNTER — Other Ambulatory Visit: Payer: Self-pay | Admitting: Urology

## 2021-05-10 ENCOUNTER — Other Ambulatory Visit (HOSPITAL_COMMUNITY): Payer: Self-pay

## 2021-05-11 ENCOUNTER — Ambulatory Visit (INDEPENDENT_AMBULATORY_CARE_PROVIDER_SITE_OTHER): Payer: Medicare Other

## 2021-05-11 DIAGNOSIS — I495 Sick sinus syndrome: Secondary | ICD-10-CM

## 2021-05-11 LAB — CUP PACEART REMOTE DEVICE CHECK
Battery Remaining Longevity: 52 mo
Battery Remaining Percentage: 43 %
Battery Voltage: 2.98 V
Brady Statistic AP VP Percent: 1 %
Brady Statistic AP VS Percent: 31 %
Brady Statistic AS VP Percent: 1 %
Brady Statistic AS VS Percent: 68 %
Brady Statistic RA Percent Paced: 31 %
Brady Statistic RV Percent Paced: 1 %
Date Time Interrogation Session: 20221207020023
Implantable Lead Implant Date: 20160415
Implantable Lead Implant Date: 20160415
Implantable Lead Location: 753859
Implantable Lead Location: 753860
Implantable Pulse Generator Implant Date: 20160415
Lead Channel Impedance Value: 430 Ohm
Lead Channel Impedance Value: 550 Ohm
Lead Channel Pacing Threshold Amplitude: 0.75 V
Lead Channel Pacing Threshold Amplitude: 0.75 V
Lead Channel Pacing Threshold Pulse Width: 0.5 ms
Lead Channel Pacing Threshold Pulse Width: 0.5 ms
Lead Channel Sensing Intrinsic Amplitude: 12 mV
Lead Channel Sensing Intrinsic Amplitude: 3.9 mV
Lead Channel Setting Pacing Amplitude: 2 V
Lead Channel Setting Pacing Amplitude: 2.5 V
Lead Channel Setting Pacing Pulse Width: 0.5 ms
Lead Channel Setting Sensing Sensitivity: 2 mV
Pulse Gen Model: 2240
Pulse Gen Serial Number: 7756161

## 2021-05-16 ENCOUNTER — Other Ambulatory Visit (HOSPITAL_COMMUNITY): Payer: Self-pay

## 2021-05-16 ENCOUNTER — Other Ambulatory Visit: Payer: Self-pay | Admitting: Gastroenterology

## 2021-05-19 ENCOUNTER — Other Ambulatory Visit: Payer: Self-pay

## 2021-05-19 ENCOUNTER — Encounter (HOSPITAL_COMMUNITY): Payer: Self-pay

## 2021-05-19 ENCOUNTER — Inpatient Hospital Stay (HOSPITAL_COMMUNITY): Payer: Medicare Other | Attending: Hematology

## 2021-05-19 VITALS — BP 117/67 | HR 63 | Temp 96.5°F | Resp 20 | Ht 70.08 in | Wt 310.6 lb

## 2021-05-19 DIAGNOSIS — C61 Malignant neoplasm of prostate: Secondary | ICD-10-CM | POA: Diagnosis present

## 2021-05-19 DIAGNOSIS — Z5111 Encounter for antineoplastic chemotherapy: Secondary | ICD-10-CM | POA: Insufficient documentation

## 2021-05-19 DIAGNOSIS — Z191 Hormone sensitive malignancy status: Secondary | ICD-10-CM

## 2021-05-19 MED ORDER — DEGARELIX ACETATE 80 MG ~~LOC~~ SOLR
80.0000 mg | Freq: Once | SUBCUTANEOUS | Status: AC
  Administered 2021-05-19: 80 mg via SUBCUTANEOUS
  Filled 2021-05-19: qty 4

## 2021-05-19 NOTE — Progress Notes (Signed)
Remote pacemaker transmission.   

## 2021-05-19 NOTE — Progress Notes (Signed)
Patient tolerated Firmagon injection with no complaints voiced.  Site clean and dry with no bruising or swelling noted at site.  See MAR for details.  Band aid applied.  Patient stable during and after injection.  Vss with discharge and left in satisfactory condition with no s/s of distress noted.

## 2021-05-19 NOTE — Patient Instructions (Signed)
[facility]  Discharge Instructions: Thank you for choosing [facility] to provide your oncology and hematology care.  If you have a lab appointment with the Cancer Center, please come in thru the Main Entrance and check in at the main information desk.  Wear comfortable clothing and clothing appropriate for easy access to any Portacath or PICC line.   We strive to give you quality time with your provider. You may need to reschedule your appointment if you arrive late (15 or more minutes).  Arriving late affects you and other patients whose appointments are after yours.  Also, if you miss three or more appointments without notifying the office, you may be dismissed from the clinic at the provider's discretion.      For prescription refill requests, have your pharmacy contact our office and allow 72 hours for refills to be completed.        To help prevent nausea and vomiting after your treatment, we encourage you to take your nausea medication as directed.  BELOW ARE SYMPTOMS THAT SHOULD BE REPORTED IMMEDIATELY: *FEVER GREATER THAN 100.4 F (38 C) OR HIGHER *CHILLS OR SWEATING *NAUSEA AND VOMITING THAT IS NOT CONTROLLED WITH YOUR NAUSEA MEDICATION *UNUSUAL SHORTNESS OF BREATH *UNUSUAL BRUISING OR BLEEDING *URINARY PROBLEMS (pain or burning when urinating, or frequent urination) *BOWEL PROBLEMS (unusual diarrhea, constipation, pain near the anus) TENDERNESS IN MOUTH AND THROAT WITH OR WITHOUT PRESENCE OF ULCERS (sore throat, sores in mouth, or a toothache) UNUSUAL RASH, SWELLING OR PAIN  UNUSUAL VAGINAL DISCHARGE OR ITCHING   Items with * indicate a potential emergency and should be followed up as soon as possible or go to the Emergency Department if any problems should occur.  Please show the CHEMOTHERAPY ALERT CARD or IMMUNOTHERAPY ALERT CARD at check-in to the Emergency Department and triage nurse.  Should you have questions after your visit or need to cancel  or reschedule your appointment, please contact [facility] [phone]  and follow the prompts.  Office hours are 8:00 a.m. to 4:30 p.m. Monday - Friday. Please note that voicemails left after 4:00 p.m. may not be returned until the following business day.  We are closed weekends and major holidays. You have access to a nurse at all times for urgent questions. Please call the main number to the clinic [phone] and follow the prompts.  For any non-urgent questions, you may also contact your provider using MyChart. We now offer e-Visits for anyone 18 and older to request care online for non-urgent symptoms. For details visit [email or web address].   Also download the MyChart app! Go to the app store, search "MyChart", open the app, select [facility], and log in with your MyChart username and password.  Due to Covid, a mask is required upon entering the hospital/clinic. If you do not have a mask, one will be given to you upon arrival. For doctor visits, patients may have 1 support person aged 18 or older with them. For treatment visits, patients cannot have anyone with them due to current Covid guidelines and our immunocompromised population.  

## 2021-05-31 ENCOUNTER — Ambulatory Visit: Payer: Medicare Other | Admitting: Podiatry

## 2021-06-06 ENCOUNTER — Other Ambulatory Visit (HOSPITAL_COMMUNITY): Payer: Self-pay

## 2021-06-07 ENCOUNTER — Encounter (HOSPITAL_COMMUNITY): Payer: Self-pay | Admitting: *Deleted

## 2021-06-07 NOTE — Progress Notes (Signed)
Erleada 60 mg tablets quantity 120 has been approved by patient's plan.  06/06/2021 - 06/04/2022.  Approval letter scanned to Epic.

## 2021-06-08 ENCOUNTER — Ambulatory Visit (INDEPENDENT_AMBULATORY_CARE_PROVIDER_SITE_OTHER): Payer: Medicare Other | Admitting: Podiatry

## 2021-06-08 ENCOUNTER — Other Ambulatory Visit (HOSPITAL_COMMUNITY): Payer: Self-pay

## 2021-06-08 DIAGNOSIS — Z91199 Patient's noncompliance with other medical treatment and regimen due to unspecified reason: Secondary | ICD-10-CM

## 2021-06-08 NOTE — Progress Notes (Signed)
No show

## 2021-06-11 ENCOUNTER — Other Ambulatory Visit: Payer: Self-pay | Admitting: Nurse Practitioner

## 2021-06-15 ENCOUNTER — Encounter (HOSPITAL_COMMUNITY): Payer: Self-pay | Admitting: Gastroenterology

## 2021-06-17 ENCOUNTER — Other Ambulatory Visit: Payer: Self-pay

## 2021-06-17 MED ORDER — SOTALOL HCL 80 MG PO TABS: ORAL_TABLET | ORAL | 0 refills | Status: DC

## 2021-06-17 NOTE — Telephone Encounter (Signed)
Pt's medication was sent to pt's pharmacy as requested. Confirmation received.  °

## 2021-06-20 ENCOUNTER — Inpatient Hospital Stay (HOSPITAL_COMMUNITY): Payer: Medicare Other

## 2021-06-20 ENCOUNTER — Other Ambulatory Visit: Payer: Self-pay

## 2021-06-20 ENCOUNTER — Inpatient Hospital Stay (HOSPITAL_COMMUNITY): Payer: Medicare Other | Attending: Hematology

## 2021-06-20 ENCOUNTER — Inpatient Hospital Stay (HOSPITAL_BASED_OUTPATIENT_CLINIC_OR_DEPARTMENT_OTHER): Payer: Medicare Other | Admitting: Hematology

## 2021-06-20 VITALS — BP 113/72 | HR 62 | Temp 97.9°F | Resp 19 | Wt 307.1 lb

## 2021-06-20 DIAGNOSIS — C61 Malignant neoplasm of prostate: Secondary | ICD-10-CM

## 2021-06-20 DIAGNOSIS — Z191 Hormone sensitive malignancy status: Secondary | ICD-10-CM

## 2021-06-20 DIAGNOSIS — Z8249 Family history of ischemic heart disease and other diseases of the circulatory system: Secondary | ICD-10-CM | POA: Insufficient documentation

## 2021-06-20 DIAGNOSIS — E039 Hypothyroidism, unspecified: Secondary | ICD-10-CM | POA: Insufficient documentation

## 2021-06-20 DIAGNOSIS — Z8041 Family history of malignant neoplasm of ovary: Secondary | ICD-10-CM | POA: Diagnosis not present

## 2021-06-20 DIAGNOSIS — Z803 Family history of malignant neoplasm of breast: Secondary | ICD-10-CM | POA: Diagnosis not present

## 2021-06-20 DIAGNOSIS — K760 Fatty (change of) liver, not elsewhere classified: Secondary | ICD-10-CM | POA: Insufficient documentation

## 2021-06-20 DIAGNOSIS — Z808 Family history of malignant neoplasm of other organs or systems: Secondary | ICD-10-CM | POA: Diagnosis not present

## 2021-06-20 DIAGNOSIS — Z87891 Personal history of nicotine dependence: Secondary | ICD-10-CM | POA: Diagnosis not present

## 2021-06-20 DIAGNOSIS — E785 Hyperlipidemia, unspecified: Secondary | ICD-10-CM | POA: Diagnosis not present

## 2021-06-20 DIAGNOSIS — E119 Type 2 diabetes mellitus without complications: Secondary | ICD-10-CM | POA: Insufficient documentation

## 2021-06-20 DIAGNOSIS — D696 Thrombocytopenia, unspecified: Secondary | ICD-10-CM | POA: Diagnosis not present

## 2021-06-20 DIAGNOSIS — I2510 Atherosclerotic heart disease of native coronary artery without angina pectoris: Secondary | ICD-10-CM | POA: Insufficient documentation

## 2021-06-20 DIAGNOSIS — Z806 Family history of leukemia: Secondary | ICD-10-CM | POA: Diagnosis not present

## 2021-06-20 DIAGNOSIS — Z833 Family history of diabetes mellitus: Secondary | ICD-10-CM | POA: Diagnosis not present

## 2021-06-20 DIAGNOSIS — Z79899 Other long term (current) drug therapy: Secondary | ICD-10-CM | POA: Insufficient documentation

## 2021-06-20 DIAGNOSIS — I10 Essential (primary) hypertension: Secondary | ICD-10-CM | POA: Diagnosis not present

## 2021-06-20 DIAGNOSIS — Z5111 Encounter for antineoplastic chemotherapy: Secondary | ICD-10-CM | POA: Diagnosis present

## 2021-06-20 DIAGNOSIS — K7460 Unspecified cirrhosis of liver: Secondary | ICD-10-CM | POA: Diagnosis not present

## 2021-06-20 DIAGNOSIS — Z801 Family history of malignant neoplasm of trachea, bronchus and lung: Secondary | ICD-10-CM | POA: Insufficient documentation

## 2021-06-20 DIAGNOSIS — I252 Old myocardial infarction: Secondary | ICD-10-CM | POA: Diagnosis not present

## 2021-06-20 DIAGNOSIS — Z800 Family history of malignant neoplasm of digestive organs: Secondary | ICD-10-CM | POA: Insufficient documentation

## 2021-06-20 DIAGNOSIS — R5383 Other fatigue: Secondary | ICD-10-CM | POA: Insufficient documentation

## 2021-06-20 LAB — CBC WITH DIFFERENTIAL/PLATELET
Abs Immature Granulocytes: 0.01 10*3/uL (ref 0.00–0.07)
Basophils Absolute: 0.1 10*3/uL (ref 0.0–0.1)
Basophils Relative: 2 %
Eosinophils Absolute: 0.1 10*3/uL (ref 0.0–0.5)
Eosinophils Relative: 2 %
HCT: 33.6 % — ABNORMAL LOW (ref 39.0–52.0)
Hemoglobin: 11.3 g/dL — ABNORMAL LOW (ref 13.0–17.0)
Immature Granulocytes: 0 %
Lymphocytes Relative: 29 %
Lymphs Abs: 1.6 10*3/uL (ref 0.7–4.0)
MCH: 33.4 pg (ref 26.0–34.0)
MCHC: 33.6 g/dL (ref 30.0–36.0)
MCV: 99.4 fL (ref 80.0–100.0)
Monocytes Absolute: 0.5 10*3/uL (ref 0.1–1.0)
Monocytes Relative: 10 %
Neutro Abs: 3.2 10*3/uL (ref 1.7–7.7)
Neutrophils Relative %: 57 %
Platelets: 117 10*3/uL — ABNORMAL LOW (ref 150–400)
RBC: 3.38 MIL/uL — ABNORMAL LOW (ref 4.22–5.81)
RDW: 14.8 % (ref 11.5–15.5)
WBC: 5.5 10*3/uL (ref 4.0–10.5)
nRBC: 0 % (ref 0.0–0.2)

## 2021-06-20 LAB — COMPREHENSIVE METABOLIC PANEL
ALT: 15 U/L (ref 0–44)
AST: 31 U/L (ref 15–41)
Albumin: 3.5 g/dL (ref 3.5–5.0)
Alkaline Phosphatase: 89 U/L (ref 38–126)
Anion gap: 7 (ref 5–15)
BUN: 13 mg/dL (ref 8–23)
CO2: 23 mmol/L (ref 22–32)
Calcium: 8.8 mg/dL — ABNORMAL LOW (ref 8.9–10.3)
Chloride: 106 mmol/L (ref 98–111)
Creatinine, Ser: 1.19 mg/dL (ref 0.61–1.24)
GFR, Estimated: >60 mL/min (ref >60)
Glucose, Bld: 231 mg/dL — ABNORMAL HIGH (ref 70–99)
Potassium: 3.8 mmol/L (ref 3.5–5.1)
Sodium: 136 mmol/L (ref 135–145)
Total Bilirubin: 0.4 mg/dL (ref 0.3–1.2)
Total Protein: 7.6 g/dL (ref 6.5–8.1)

## 2021-06-20 LAB — PSA: Prostatic Specific Antigen: 0.01 ng/mL (ref 0.00–4.00)

## 2021-06-20 LAB — VITAMIN D 25 HYDROXY (VIT D DEFICIENCY, FRACTURES): Vit D, 25-Hydroxy: 79.22 ng/mL (ref 30–100)

## 2021-06-20 MED ORDER — DEGARELIX ACETATE 80 MG ~~LOC~~ SOLR
80.0000 mg | Freq: Once | SUBCUTANEOUS | Status: AC
  Administered 2021-06-20: 80 mg via SUBCUTANEOUS
  Filled 2021-06-20: qty 4

## 2021-06-20 NOTE — Progress Notes (Signed)
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 A variant of uncertain significance (VUS) was detected in the NF1 gene called c.1178A>G. The report date is 05/23/2020.  The Common Hereditary Cancers Panel offered by [facility] includes sequencing and/or deletion duplication testing of the following 47 genes: APC, ATM, AXIN2, BARD1, BMPR1A, BRCA1, BRCA2, BRIP1, CDH1, CDK4, CDKN2A (p14ARF), CDKN2A (p16INK4a), CHEK2, CTNNA1, DICER1, EPCAM (Deletion/duplication testing only), GREM1 (promoter region deletion/duplication testing only), KIT, MEN1, MLH1, MSH2, MSH3, MSH6, MUTYH, NBN, NF1, NTHL1, PALB2, PDGFRA, PMS2, POLD1, POLE, PTEN, RAD50, RAD51C, RAD51D, SDHB, SDHC, SDHD, SMAD4, SMARCA4. STK11, TP53, TSC1, TSC2, and VHL.  The following genes were evaluated for sequence changes only: SDHA and HOXB13 c.251G>A variant only. The Prostate Cancer HRR Panel offered by [facility] includes sequencing and/or deletion duplication analysis of the following 10 genes: ATM, BARD1, BRCA1, BRCA2, BRIP1, CHEK2, FANCL, PALB2, RAD51C, RAD51D.     CANCER STAGING:  Cancer Staging  No matching staging information was found for the patient.  INTERVAL HISTORY:  Peter Dunlap, a 66 y.o. male, returns for routine follow-up of his metastatic castration sensitive  prostate cancer. Peter Dunlap was last seen on 03/24/2021.   Today he reports feeling well. He is taking ERLEADA and tolerating it well. He denies hot flashes, tingling/numbness, and ankle swellings. He denies nausea and vomiting. He is taking 50,000 units of vitamin D once a week, and he is not taking calcium.   REVIEW OF SYSTEMS:  Review of Systems  Constitutional:  Positive for fatigue. Negative for appetite change.  Cardiovascular:  Negative for leg swelling.  Gastrointestinal:  Negative for nausea and vomiting.  Endocrine: Negative for hot flashes.  Neurological:  Negative for numbness.  All other systems reviewed and are negative.  PAST MEDICAL/SURGICAL HISTORY:  Past Medical History:  Diagnosis Date   Arteriosclerotic cardiovascular disease (ASCVD) 2005   catheterization in 10/2010:50% mid LAD, diffuse distal disease, circumflex irregularities, large dominant RCA with a 50% ostial, 70% distal, 60% posterolateral and 70% PDA; normal EF   Arthritis    Benign prostatic hypertrophy    Bilateral carpal tunnel syndrome 07/03/2018   Cerebrovascular disease 2010   R. carotid endarterectomy; Duplex in 10/2010-widely patent ICAs, subtotal left vertebral-not thought to be contributing to symptoms   Cervical spine disease    CT in 2012-advanced degeneration and spondylosis with moderate spinal stenosis at C3-C6   CHF (congestive heart failure) ([facility])    Depression    Diabetes mellitus without complication ([facility])    Erectile dysfunction    Family history of breast cancer    Family history of cancer of mouth    Family history of CML (chronic monocytic leukemia)    Family history of lung cancer    Family history of ovarian cancer    Family history of stomach cancer    Gastroesophageal reflux disease    H/O hiatal  hernia    H/O: substance abuse ([facility])    Cocaine, marijuana, alcohol.  Quit 2013.    Hyperlipidemia    Hypertension    Non-ST elevation myocardial infarction (NSTEMI), initial episode  of care ([facility]) 12/02/2013   DES LAD   Obesity    Prostate cancer ([facility])    Sleep apnea    CPAP   Tachy-brady syndrome ([place])    a. 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 25, 2010    CEA   CORONARY ANGIOPLASTY WITH STENT PLACEMENT  12/03/2013   LAD 90%-->0% W/ Promus Premier DES 3.0 mm x 16 mm, CFX OK, RCA 40%, EF 70-75%   LEFT ATRIAL APPENDAGE OCCLUSION N/A 08/05/2015   Procedure: LEFT ATRIAL APPENDAGE OCCLUSION;  Surgeon: [name], MD;  Location: [facility] CV LAB;  Service: Cardiovascular;  Laterality: N/A;   LEFT HEART CATHETERIZATION WITH CORONARY ANGIOGRAM Left 12/03/2013   Procedure: LEFT HEART CATHETERIZATION WITH CORONARY ANGIOGRAM;  Surgeon: Leonie Man, MD;  Location: [facility] CATH LAB;  Service: Cardiovascular;  Laterality: Left;   LEFT HEART CATHETERIZATION WITH CORONARY ANGIOGRAM N/A 01/26/2014   Procedure: LEFT HEART CATHETERIZATION WITH CORONARY ANGIOGRAM;  Surgeon: Jettie Booze, MD;  Location: [facility] CATH LAB;  Service: Cardiovascular;  Laterality: N/A;   LEFT HEART CATHETERIZATION WITH CORONARY ANGIOGRAM N/A 08/03/2014   Procedure: LEFT HEART CATHETERIZATION WITH CORONARY ANGIOGRAM;  Surgeon: Burnell Blanks, MD;  Location: [facility] CATH LAB;  Service: Cardiovascular;  Laterality: N/A;   PERCUTANEOUS CORONARY STENT INTERVENTION (PCI-S)  12/03/2013   Procedure: PERCUTANEOUS CORONARY STENT INTERVENTION (PCI-S);  Surgeon: Leonie Man, MD;  Location: [facility] CATH LAB;  Service: Cardiovascular;;   PERMANENT PACEMAKER INSERTION N/A 09/18/2014   Procedure: PERMANENT PACEMAKER INSERTION;  Surgeon: [name], MD;   Location: [facility] CATH LAB;  Service: Cardiovascular;  Laterality: N/A;   RADIOFREQUENCY ABLATION  2005   for PSVT   TEE WITHOUT CARDIOVERSION N/A 07/27/2015   Procedure: TRANSESOPHAGEAL ECHOCARDIOGRAM (TEE);  Surgeon: Lelon Perla, MD;  Location: [facility] ENDOSCOPY;  Service: Cardiovascular;  Laterality: N/A;   TEE WITHOUT CARDIOVERSION N/A 09/15/2015   Procedure: TRANSESOPHAGEAL ECHOCARDIOGRAM (TEE);  Surgeon: Thayer Headings, MD;  Location: [facility] ENDOSCOPY;  Service: Cardiovascular;  Laterality: N/A;    SOCIAL HISTORY:  Social History   Socioeconomic History   Marital status: Married    Spouse name: Not on file   Number of children: 0   Years of education: Not on file   Highest education level: Not on file  Occupational History   Occupation: Retired  Tobacco Use   Smoking status: Former    Packs/day: 1.00    Years: 40.00    Pack years: 40.00    Types: Cigarettes    Start date: 10/20/1972    Quit date: 10/10/2012    Years since quitting: 8.6   Smokeless tobacco: Never   Tobacco comments:    Quit in May.   Vaping Use   Vaping Use: Never used  Substance and Sexual Activity   Alcohol use: Yes    Alcohol/week: 0.0  standard drinks    Comment: former drinker-- sober since 2013.    Drug use: No    Types: Cocaine    Comment: quit cocaine 10/2011   Sexual activity: Yes    Partners: Female  Other Topics Concern   Not on file  Social History Narrative   Lives in [place].   Social Determinants of Radio broadcast assistant Strain: Not on file  Food Insecurity: Not on file  Transportation Needs: Not on file  Physical Activity: Not on file  Stress: Not on file  Social Connections: Not on file  Intimate Partner Violence: Not on file    FAMILY HISTORY:  Family History  Problem Relation Age of Onset   Hypertension Mother        Cerebrovascular disease   Diabetes Mother    Coronary artery disease Father 14   Diabetes type II Father    Hypertension Father    Heart attack  Father    Diabetes Brother    Hypertension Brother    Diabetes Sister    Hypertension Sister    Heart attack Sister 41   Leukemia Sister 22       CML   Breast cancer Maternal Grandmother        dx 75s   Lung cancer Maternal Uncle        dx >50, smoker   Cancer Cousin        mouth cancer, dx 15s, no smoking/chew tobacco hx (maternal 1st cousin)   Ovarian cancer Cousin        dx <50 (maternal 1st cousin)   Stomach cancer Cousin        dx 47s (maternal 1st cousin)   Cancer Cousin        type unknown to pt, dx >50 (paternal 1st cousin)    CURRENT MEDICATIONS:  Current Outpatient Medications  Medication Sig Dispense Refill   allopurinol (ZYLOPRIM) 100 MG tablet TAKE 1 TABLET(100 MG) BY MOUTH DAILY 90 tablet 0   apalutamide (ERLEADA) 60 MG tablet TAKE 4 TABLETS (240 MG TOTAL) BY MOUTH DAILY. MAY BE TAKEN WITH OR WITHOUT FOOD. SWALLOW TABLETS WHOLE. (Patient taking differently: 240 mg every evening.) 120 tablet 3   aspirin EC 325 MG tablet Take 1 tablet (325 mg total) daily by mouth. 30 tablet 0   Blood Glucose Monitoring Suppl (BLOOD GLUCOSE SYSTEM PAK) KIT Use as directed to monitor FSBS 1x daily. Dx: E11.9. 1 kit 1   CLENPIQ 10-3.5-12 MG-GM -GM/160ML SOLN Take 320 mLs by mouth as directed.     Cyanocobalamin (VITAMIN B 12) 500 MCG TABS Take 500 mcg by mouth in the morning.     diphenoxylate-atropine (LOMOTIL) 2.5-0.025 MG tablet Take 1 tablet by mouth every 6 (six) hours as needed for diarrhea or loose stools.     fluticasone (FLONASE) 50 MCG/ACT nasal spray Place 2 sprays into both nostrils daily. (Patient taking differently: Place 2 sprays into both nostrils daily as needed for allergies.) 16 g 6   furosemide (LASIX) 20 MG tablet TAKE 3 TABLETS BY MOUTH DAILY 270 tablet 3   glipiZIDE (GLUCOTROL XL) 5 MG 24 hr tablet TAKE 1 TABLET(5 MG) BY MOUTH DAILY WITH BREAKFAST 90 tablet 0   HOMEOPATHIC PRODUCTS EX Apply topically.     isosorbide mononitrate (IMDUR) 60 MG 24 hr tablet Take 1  tablet (60 mg total) by mouth daily. 90 tablet 1   levothyroxine (SYNTHROID) 200 MCG tablet TAKE 1 TABLET(200 MCG) BY MOUTH DAILY BEFORE AND BREAKFAST 90  tablet 0   levothyroxine (SYNTHROID) 25 MCG tablet Take 25 mcg by mouth daily.     linaclotide (LINZESS) 290 MCG CAPS capsule Take 290 mcg by mouth daily before breakfast.     loratadine (CLARITIN) 10 MG tablet TAKE 1 TABLET(10 MG) BY MOUTH DAILY AS NEEDED FOR ALLERGIES 30 tablet 2   magnesium oxide (MAG-OX) 400 (240 Mg) MG tablet TAKE 1 TABLET(400 MG) BY MOUTH TWICE DAILY 180 tablet 1   metFORMIN (GLUCOPHAGE) 500 MG tablet Take 1 tablet (500 mg total) by mouth daily with breakfast. 90 tablet 0   naloxone (NARCAN) nasal spray 4 mg/0.1 mL Place 4 mg into the nose as needed (accidental overdose).     nitroGLYCERIN (NITROSTAT) 0.4 MG SL tablet PLACE 1 TABLET UNDER THE TONGUE EVERY 5 MINUTES AS NEEDED FOR CHEST PAIN. CALL 911 AT THIRD DOSE IN 15 MINUTES 25 tablet 1   ONETOUCH ULTRA test strip USE TO TEST ONCE DAILY 100 strip 1   oxyCODONE-acetaminophen (PERCOCET) 10-325 MG tablet Take 1 tablet by mouth every 8 (eight) hours as needed for pain. 90 tablet 0   pantoprazole (PROTONIX) 40 MG tablet TAKE 1 TABLET(40 MG) BY MOUTH DAILY 30 tablet 3   rosuvastatin (CRESTOR) 40 MG tablet Take 40 mg by mouth every evening.     silodosin (RAPAFLO) 8 MG CAPS capsule TAKE 1 CAPSULE(8 MG) BY MOUTH TWICE DAILY 180 capsule 3   sotalol (BETAPACE) 80 MG tablet Take 2 tablets (160 mg) in the AM and 1 tablet (80 mg) in the PM. Please make yearly appt with Dr. Lovena Le for February 2023 for future refills. Thank you 1st attempt 270 tablet 0   Vitamin D, Ergocalciferol, (DRISDOL) 1.25 MG (50000 UNIT) CAPS capsule TAKE 1 CAPSULE BY MOUTH EVERY 7 DAYS (Patient taking differently: Take 50,000 Units by mouth every Thursday.) 12 capsule 0   No current facility-administered medications for this visit.   Facility-Administered Medications Ordered in Other Visits  Medication Dose  Route Frequency Provider Last Rate Last Admin   degarelix (FIRMAGON) injection 80 mg  80 mg Subcutaneous Once Derek Jack, MD        ALLERGIES:  Allergies  Allergen Reactions   Trazodone And Nefazodone     Nightmares   Lactose Intolerance (Gi) Other (See Comments)    UPSET STOMACH     PHYSICAL EXAM:  Performance status (ECOG): 1 - Symptomatic but completely ambulatory  Vitals:   06/20/21 1358  BP: 113/72  Pulse: 62  Resp: 19  Temp: 97.9 F (36.6 C)  SpO2: 100%   Wt Readings from Last 3 Encounters:  06/20/21 (!) 307 lb 1.6 oz (139.3 kg)  05/19/21 (!) 310 lb 10.1 oz (140.9 kg)  04/21/21 (!) 309 lb 11.9 oz (140.5 kg)   Physical Exam Vitals reviewed.  Constitutional:      Appearance: Normal appearance. He is obese.  Cardiovascular:     Rate and Rhythm: Normal rate and regular rhythm.     Pulses: Normal pulses.     Heart sounds: Normal heart sounds.  Pulmonary:     Effort: Pulmonary effort is normal.     Breath sounds: Normal breath sounds.  Neurological:     General: No focal deficit present.     Mental Status: He is alert and oriented to person, place, and time.  Psychiatric:        Mood and Affect: Mood normal.        Behavior: Behavior normal.     LABORATORY DATA:  I have  reviewed the labs as listed.  CBC Latest Ref Rng & Units 06/20/2021 03/24/2021 12/30/2020  WBC 4.0 - 10.5 K/uL 5.5 6.3 6.7  Hemoglobin 13.0 - 17.0 g/dL 11.3(L) 12.4(L) 11.9(L)  Hematocrit 39.0 - 52.0 % 33.6(L) 36.9(L) 35.5(L)  Platelets 150 - 400 K/uL 117(L) 115(L) 121(L)   CMP Latest Ref Rng & Units 06/20/2021 03/24/2021 12/30/2020  Glucose 70 - 99 mg/dL 231(H) 110(H) 91  BUN 8 - 23 mg/dL $Remove'13 15 10  'UytFkWH$ Creatinine 0.61 - 1.24 mg/dL 1.19 1.36(H) 1.43(H)  Sodium 135 - 145 mmol/L 136 136 139  Potassium 3.5 - 5.1 mmol/L 3.8 4.0 3.7  Chloride 98 - 111 mmol/L 106 108 107  CO2 22 - 32 mmol/L 23 20(L) 24  Calcium 8.9 - 10.3 mg/dL 8.8(L) 9.1 9.5  Total Protein 6.5 - 8.1 g/dL 7.6 7.7 7.7   Total Bilirubin 0.3 - 1.2 mg/dL 0.4 0.9 0.8  Alkaline Phos 38 - 126 U/L 89 77 78  AST 15 - 41 U/L 31 34 26  ALT 0 - 44 U/L $Remo'15 14 13    'XSqWH$ DIAGNOSTIC IMAGING:  I have independently reviewed the scans and discussed with the patient. No results found.   ASSESSMENT:  1.  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 -Status post IMRT, 40 sessions completed on 06/14/2016 by Dr. Tammi Klippel. -PSA 0.7 (10/15/2018), 0.8 (02/18/2019), 1.4 (December 2020), 1.4 (June 2021) -Prostate biopsy on 02/10/2020-1 out of 12 cores from left mid lateral region positive for adenocarcinoma. -CTAP on 03/09/2020 with newly enlarged left iliac lymph nodes measuring 1.1 x 0.9 cm.  Hepatic steatosis with early signs of cirrhosis. -F-18 PSMA PET scan on 04/12/2020 showed radiotracer activity associated with enlarged left external iliac lymph node 9 mm with SUV 7.6.  Small left common iliac lymph node 6 mm, SUV 8.6.  Left periaortic lymph node 5 mm, SUV 6.3.  Lymph node between IVC and aorta at the level of right renal hilum, SUV 8.6.  Lymph node deep to the IVC measuring 6 mm, SUV 5.4.  No skeletal metastasis. 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 - Firmagon started on 04/21/2020 - Apalutamide 240 milligrams daily started around 04/22/2020.   2.  Social/family history: -He is a retired Nature conservation officer.  He lives at home with his wife.  He plays golf.  He quit smoking in 2015, smoked 1 pack/day for more than 20 years. -Sister has CML.  Maternal grandmother had breast cancer.  Maternal uncle had lung cancer.  2 maternal cousins had tongue cancer and uterine cancer.   3.  Cirrhosis: -Prior imaging showed cirrhosis of the liver with normal spleen.   PLAN:  1.  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 - He does not report any falls or other side effects. - Reviewed labs from today which shows normal LFTs.  PSA 0.01. - He will proceed with Mills Koller today and monthly. - Recommend continuing apalutamide 260 milligrams daily. - RTC 3 months for follow-up.  We will plan to perform DEXA scan prior to next visit along with labs.   2.  Poorly controlled diabetes: - Continue metformin and Glucotrol.   3.  Mild thrombocytopenia: - Mild thrombocytopenia on and off since December 2020 has been stable.  Today's platelet count is 117.  No bleeding issues.   4.  Hypothyroidism: - Continue  Synthroid daily.  TSH today is pending.   Orders placed this encounter:  No orders of the defined types were placed in this encounter.    Derek Jack, MD [place] [phone]   I, Thana Ates, am acting as a scribe for Dr. Derek Jack.  I, Derek Jack MD, have reviewed the above documentation for accuracy and completeness, and I agree with the above.

## 2021-06-20 NOTE — Patient Instructions (Signed)
[facility]  Discharge Instructions: Thank you for choosing [facility] to provide your oncology and hematology care.  If you have a lab appointment with the [facility], please come in thru the Main Entrance and check in at the main information desk.  Wear comfortable clothing and clothing appropriate for easy access to any Portacath or PICC line.   We strive to give you quality time with your provider. You may need to reschedule your appointment if you arrive late (15 or more minutes).  Arriving late affects you and other patients whose appointments are after yours.  Also, if you miss three or more appointments without notifying the office, you may be dismissed from the clinic at the providers discretion.      For prescription refill requests, have your pharmacy contact our office and allow 72 hours for refills to be completed.    Today you received the following chemotherapy and/or immunotherapy agents Mills Koller      To help prevent nausea and vomiting after your treatment, we encourage you to take your nausea medication as directed.  BELOW ARE SYMPTOMS THAT SHOULD BE REPORTED IMMEDIATELY: *FEVER GREATER THAN 100.4 F (38 C) OR HIGHER *CHILLS OR SWEATING *NAUSEA AND VOMITING THAT IS NOT CONTROLLED WITH YOUR NAUSEA MEDICATION *UNUSUAL SHORTNESS OF BREATH *UNUSUAL BRUISING OR BLEEDING *URINARY PROBLEMS (pain or burning when urinating, or frequent urination) *BOWEL PROBLEMS (unusual diarrhea, constipation, pain near the anus) TENDERNESS IN MOUTH AND THROAT WITH OR WITHOUT PRESENCE OF ULCERS (sore throat, sores in mouth, or a toothache) UNUSUAL RASH, SWELLING OR PAIN  UNUSUAL VAGINAL DISCHARGE OR ITCHING   Items with * indicate a potential emergency and should be followed up as soon as possible or go to the Emergency Department if any problems should occur.  Please show the CHEMOTHERAPY ALERT CARD or IMMUNOTHERAPY ALERT CARD at check-in to the Emergency  Department and triage nurse.  Should you have questions after your visit or need to cancel or reschedule your appointment, please contact [facility] [phone]  and follow the prompts.  Office hours are 8:00 a.m. to 4:30 p.m. Monday - Friday. Please note that voicemails left after 4:00 p.m. may not be returned until the following business day.  We are closed weekends and major holidays. You have access to a nurse at all times for urgent questions. Please call the main number to the clinic [phone] and follow the prompts.  For any non-urgent questions, you may also contact your provider using MyChart. We now offer e-Visits for anyone 66 and older to request care online for non-urgent symptoms. For details visit [email or web address].   Also download the MyChart app! Go to the app store, search "MyChart", open the app, select [facility], and log in with your MyChart username and password.  Due to Covid, a mask is required upon entering the hospital/clinic. If you do not have a mask, one will be given to you upon arrival. For doctor visits, patients may have 1 support person aged 41 or older with them. For treatment visits, patients cannot have anyone with them due to current Covid guidelines and our immunocompromised population.

## 2021-06-20 NOTE — Progress Notes (Signed)
Patient has been assessed, vital signs and labs have been reviewed by Dr. Delton Coombes. ANC, Creatinine, LFTs, and Platelets are within treatment parameters per Dr. Delton Coombes. The patient is good to proceed with [place] treatment at this time.  Primary RN and pharmacy aware.

## 2021-06-20 NOTE — Progress Notes (Signed)
Patient presents today for Firmagon injection per providers order.  Vital signs within parameters for treatment.  Message received from [facility] LPN/Dr. Delton Coombes patient okay for treatment. Stable during administration without incident; injection site WNL; see MAR for injection details.  Patient tolerated procedure well and without incident.  No questions or complaints noted at this time.

## 2021-06-20 NOTE — Patient Instructions (Signed)
[place] at [facility] Discharge Instructions  You were seen and examined today by Dr. Delton Coombes. He reviewed your most recent labs everything that is back looks okay, except your complete blood count labs shows slight anemia. We will call if there is any problems with your PSA and Vitamin D results. Please keep follow up appointment as scheduled in 3 months.     Thank you for choosing [place] at [facility] to provide your oncology and hematology care.  To afford each patient quality time with our provider, please arrive at least 15 minutes before your scheduled appointment time.   If you have a lab appointment with the [facility] please come in thru the Main Entrance and check in at the main information desk.  You need to re-schedule your appointment should you arrive 10 or more minutes late.  We strive to give you quality time with our providers, and arriving late affects you and other patients whose appointments are after yours.  Also, if you no show three or more times for appointments you may be dismissed from the clinic at the providers discretion.     Again, thank you for choosing [facility].  Our hope is that these requests will decrease the amount of time that you wait before being seen by our physicians.       _____________________________________________________________  Should you have questions after your visit to [facility], please contact our office at [phone] and follow the prompts.  Our office hours are 8:00 a.m. and 4:30 p.m. Monday - Friday.  Please note that voicemails left after 4:00 p.m. may not be returned until the following business day.  We are closed weekends and major holidays.  You do have access to a nurse 24-7, just call the main number to the clinic [phone] and do not press any options, hold on the line and a nurse will answer the phone.    For prescription refill requests,  have your pharmacy contact our office and allow 72 hours.    Due to Covid, you will need to wear a mask upon entering the hospital. If you do not have a mask, a mask will be given to you at the Main Entrance upon arrival. For doctor visits, patients may have 1 support person age 59 or older with them. For treatment visits, patients can not have anyone with them due to social distancing guidelines and our immunocompromised population.

## 2021-06-21 LAB — TESTOSTERONE: Testosterone: 12 ng/dL — ABNORMAL LOW (ref 264–916)

## 2021-06-24 ENCOUNTER — Encounter (HOSPITAL_COMMUNITY)
Admission: RE | Disposition: A | Discharge status: Home / Self Care | Payer: Self-pay | Source: Home / Self Care | Attending: Gastroenterology

## 2021-06-24 ENCOUNTER — Ambulatory Visit (HOSPITAL_COMMUNITY)
Admission: RE | Admit: 2021-06-24 | Discharge: 2021-06-24 | Disposition: A | Discharge status: Home / Self Care | Payer: Medicare Other | Attending: Gastroenterology | Admitting: Gastroenterology

## 2021-06-24 ENCOUNTER — Other Ambulatory Visit (HOSPITAL_COMMUNITY): Payer: Medicare Other

## 2021-06-24 ENCOUNTER — Ambulatory Visit (HOSPITAL_COMMUNITY): Payer: Medicare Other | Admitting: Anesthesiology

## 2021-06-24 ENCOUNTER — Other Ambulatory Visit: Payer: Self-pay

## 2021-06-24 ENCOUNTER — Encounter (HOSPITAL_COMMUNITY): Payer: Self-pay | Admitting: Gastroenterology

## 2021-06-24 DIAGNOSIS — Z950 Presence of cardiac pacemaker: Secondary | ICD-10-CM | POA: Diagnosis not present

## 2021-06-24 DIAGNOSIS — K5730 Diverticulosis of large intestine without perforation or abscess without bleeding: Secondary | ICD-10-CM | POA: Diagnosis not present

## 2021-06-24 DIAGNOSIS — K635 Polyp of colon: Secondary | ICD-10-CM | POA: Diagnosis not present

## 2021-06-24 DIAGNOSIS — K219 Gastro-esophageal reflux disease without esophagitis: Secondary | ICD-10-CM | POA: Diagnosis not present

## 2021-06-24 DIAGNOSIS — D123 Benign neoplasm of transverse colon: Secondary | ICD-10-CM | POA: Insufficient documentation

## 2021-06-24 DIAGNOSIS — Z79899 Other long term (current) drug therapy: Secondary | ICD-10-CM | POA: Insufficient documentation

## 2021-06-24 DIAGNOSIS — D122 Benign neoplasm of ascending colon: Secondary | ICD-10-CM | POA: Diagnosis not present

## 2021-06-24 DIAGNOSIS — K621 Rectal polyp: Secondary | ICD-10-CM | POA: Insufficient documentation

## 2021-06-24 DIAGNOSIS — Z955 Presence of coronary angioplasty implant and graft: Secondary | ICD-10-CM | POA: Diagnosis not present

## 2021-06-24 DIAGNOSIS — G4730 Sleep apnea, unspecified: Secondary | ICD-10-CM | POA: Insufficient documentation

## 2021-06-24 DIAGNOSIS — I252 Old myocardial infarction: Secondary | ICD-10-CM | POA: Diagnosis not present

## 2021-06-24 DIAGNOSIS — M1990 Unspecified osteoarthritis, unspecified site: Secondary | ICD-10-CM | POA: Diagnosis not present

## 2021-06-24 DIAGNOSIS — Z87891 Personal history of nicotine dependence: Secondary | ICD-10-CM | POA: Diagnosis not present

## 2021-06-24 DIAGNOSIS — Z1211 Encounter for screening for malignant neoplasm of colon: Secondary | ICD-10-CM | POA: Diagnosis present

## 2021-06-24 HISTORY — PX: POLYPECTOMY: SHX5525

## 2021-06-24 HISTORY — PX: COLONOSCOPY WITH PROPOFOL: SHX5780

## 2021-06-24 LAB — GLUCOSE, CAPILLARY: Glucose-Capillary: 146 mg/dL — ABNORMAL HIGH (ref 70–99)

## 2021-06-24 SURGERY — COLONOSCOPY WITH PROPOFOL
Anesthesia: General

## 2021-06-24 MED ORDER — PROPOFOL 500 MG/50ML IV EMUL
INTRAVENOUS | Status: DC | PRN
  Administered 2021-06-24: 75 ug/kg/min via INTRAVENOUS

## 2021-06-24 MED ORDER — LACTATED RINGERS IV SOLN
INTRAVENOUS | Status: DC
  Administered 2021-06-24: 1000 mL via INTRAVENOUS

## 2021-06-24 MED ORDER — SODIUM CHLORIDE 0.9 % IV SOLN: INTRAVENOUS | Status: DC

## 2021-06-24 SURGICAL SUPPLY — 22 items

## 2021-06-24 NOTE — Op Note (Signed)
[facility] Patient Name: Peter Dunlap Procedure Date: 06/24/2021 MRN: [id number] Attending MD: Carol Ada , MD Date of Birth: 1956/05/16 CSN: [id number] Age: 66 Admit Type: Outpatient Procedure:                Colonoscopy Indications:              Screening for colorectal malignant neoplasm Providers:                Carol Ada, MD, Burtis Junes, RN, Luan Moore,                            Technician, Eliberto Ivory Referring MD:              Medicines:                Propofol per Anesthesia Complications:            No immediate complications. Estimated Blood Loss:     Estimated blood loss: none. Procedure:                Pre-Anesthesia Assessment:                           - Prior to the procedure, a History and Physical                            was performed, and patient medications and                            allergies were reviewed. The patient's tolerance of                            previous anesthesia was also reviewed. The risks                            and benefits of the procedure and the sedation                            options and risks were discussed with the patient.                            All questions were answered, and informed consent                            was obtained. Prior Anticoagulants: The patient has                            taken no previous anticoagulant or antiplatelet                            agents. ASA Grade Assessment: III - A patient with                            severe systemic disease. After reviewing the risks  and benefits, the patient was deemed in                            satisfactory condition to undergo the procedure.                           - Sedation was administered by an anesthesia                            professional. Deep sedation was attained.                           After obtaining informed consent, the colonoscope                            was passed  under direct vision. Throughout the                            procedure, the patient's blood pressure, pulse, and                            oxygen saturations were monitored continuously. The                            CF-HQ190L ([id number]) Olympus colonoscope was                            introduced through the anus and advanced to the the                            cecum, identified by appendiceal orifice and                            ileocecal valve. The colonoscopy was performed                            without difficulty. The patient tolerated the                            procedure well. The quality of the bowel                            preparation was evaluated using the BBPS [facility]                            Bowel Preparation Scale) with scores of: Right                            Colon = 3 (entire mucosa seen well with no residual                            staining, small fragments of stool or opaque  liquid), Transverse Colon = 3 (entire mucosa seen                            well with no residual staining, small fragments of                            stool or opaque liquid) and Left Colon = 2 (minor                            amount of residual staining, small fragments of                            stool and/or opaque liquid, but mucosa seen well).                            The total BBPS score equals 8. The quality of the                            bowel preparation was good. The ileocecal valve,                            appendiceal orifice, and rectum were photographed. Scope In: 9:51:15 AM Scope Out: 10:13:24 AM Scope Withdrawal Time: 0 hours 18 minutes 16 seconds  Total Procedure Duration: 0 hours 22 minutes 9 seconds  Findings:      A 10 mm polyp was found in the ascending colon. The polyp was       pedunculated. The polyp was removed with a hot snare. Resection and       retrieval were complete.      Five sessile polyps were found  in the rectum, descending colon and       transverse colon. The polyps were 2 to 3 mm in size. These polyps were       removed with a cold snare. Resection and retrieval were complete.      Scattered medium-mouthed diverticula were found in the entire colon.      The descending colon polyp and rectal polyp were intentionally not       collected, because of their size and gross appearance. These polyps were       not going to change the follow up recommendations. Impression:               - One 10 mm polyp in the ascending colon, removed                            with a hot snare. Resected and retrieved.                           - Five 2 to 3 mm polyps in the rectum, in the                            descending colon and in the transverse colon,                            removed with a  cold snare. Resected and retrieved.                           - Diverticulosis in the entire examined colon. Moderate Sedation:      Not Applicable - Patient had care per Anesthesia. Recommendation:           - Patient has a contact number available for                            emergencies. The signs and symptoms of potential                            delayed complications were discussed with the                            patient. Return to normal activities tomorrow.                            Written discharge instructions were provided to the                            patient.                           - Resume previous diet.                           - Continue present medications.                           - Await pathology results.                           - Repeat colonoscopy in 3 years for surveillance. Procedure Code(s):        --- Professional ---                           [phone], Colonoscopy, flexible; with removal of                            tumor(s), polyp(s), or other lesion(s) by snare                            technique Diagnosis Code(s):        --- Professional ---                            K63.5, Polyp of colon                           Z12.11, Encounter for screening for malignant                            neoplasm of colon                           K62.1, Rectal polyp  K57.30, Diverticulosis of large intestine without                            perforation or abscess without bleeding CPT copyright 2019 American [employer] Association. All rights reserved. The codes documented in this report are preliminary and upon coder review may  be revised to meet current compliance requirements. Carol Ada, MD Carol Ada, MD 06/24/2021 10:22:04 AM This report has been signed electronically. Number of Addenda: 0

## 2021-06-24 NOTE — Transfer of Care (Signed)
Immediate Anesthesia Transfer of Care Note  Patient: Peter Dunlap  Procedure(s) Performed: Procedure(s): COLONOSCOPY WITH PROPOFOL (N/A) POLYPECTOMY  Patient Location: PACU and Endoscopy Unit  Anesthesia Type:MAC  Level of Consciousness: awake, alert  and oriented  Airway & Oxygen Therapy: Patient Spontanous Breathing and Patient connected to nasal cannula oxygen  Post-op Assessment: Report given to RN and Post -op Vital signs reviewed and stable  Post vital signs: Reviewed and stable  Last Vitals:  Vitals:   06/24/21 0904  BP: (!) 139/57  Pulse: 60  Resp: 18  Temp: 36.6 C  SpO2: 36%    Complications: No apparent anesthesia complications

## 2021-06-24 NOTE — Anesthesia Postprocedure Evaluation (Signed)
Anesthesia Post Note  Patient: Peter Dunlap  Procedure(s) Performed: COLONOSCOPY WITH PROPOFOL POLYPECTOMY     Patient location during evaluation: Endoscopy Anesthesia Type: General Level of consciousness: awake and alert Pain management: pain level controlled Vital Signs Assessment: post-procedure vital signs reviewed and stable Respiratory status: spontaneous breathing, nonlabored ventilation and respiratory function stable Cardiovascular status: blood pressure returned to baseline and stable Postop Assessment: no apparent nausea or vomiting Anesthetic complications: no   No notable events documented.  Last Vitals:  Vitals:   06/24/21 1020 06/24/21 1030  BP: 111/66 131/73  Pulse: (!) 59 60  Resp: 19 14  Temp:    SpO2: 99% 96%    Last Pain:  Vitals:   06/24/21 1030  TempSrc:   PainSc: 0-No pain                 Merlinda Frederick

## 2021-06-24 NOTE — Anesthesia Preprocedure Evaluation (Signed)
Anesthesia Evaluation  Patient identified by MRN, date of birth, ID band Patient awake    Reviewed: Allergy & Precautions, NPO status , Patient's Chart, lab work & pertinent test results  Airway Mallampati: II  TM Distance: >3 FB Neck ROM: Full    Dental no notable dental hx.    Pulmonary sleep apnea , former smoker,    Pulmonary exam normal breath sounds clear to auscultation       Cardiovascular hypertension, + CAD, + Past MI and + Cardiac Stents  Normal cardiovascular exam+ pacemaker  Rhythm:Regular Rate:Normal     Neuro/Psych PSYCHIATRIC DISORDERS Depression  Neuromuscular disease    GI/Hepatic hiatal hernia, GERD  ,(+)     substance abuse  alcohol use, cocaine use and marijuana use,   Endo/Other  diabetesHypothyroidism   Renal/GU negative Renal ROS  negative genitourinary   Musculoskeletal  (+) Arthritis , Osteoarthritis,    Abdominal (+) + obese,   Peds negative pediatric ROS (+)  Hematology negative hematology ROS (+)   Anesthesia Other Findings   Reproductive/Obstetrics negative OB ROS                             Anesthesia Physical Anesthesia Plan  ASA: 4  Anesthesia Plan: General   Post-op Pain Management:    Induction: Intravenous  PONV Risk Score and Plan: 2 and Treatment may vary due to age or medical condition  Airway Management Planned: Simple Face Mask and Natural Airway  Additional Equipment: None  Intra-op Plan:   Post-operative Plan:   Informed Consent: I have reviewed the patients History and Physical, chart, labs and discussed the procedure including the risks, benefits and alternatives for the proposed anesthesia with the patient or authorized representative who has indicated his/her understanding and acceptance.       Plan Discussed with: Anesthesiologist and Surgeon  Anesthesia Plan Comments:         Anesthesia Quick Evaluation

## 2021-06-24 NOTE — Discharge Instructions (Signed)

## 2021-06-24 NOTE — H&P (Signed)
Peter Dunlap HPI: This 66 year old black male presents to the office for colorectal cancer screening. He has 1 BM every 2 days but can go up to a week without having a BM. There is  no obvious blood or mucus in the stool. He is taking Percocet for joint pain and arthritis, but not on a daily basis. He is taking Dexilant for acid reflux with good control. He has a good appetite and has gained 15 pounds over the last 4 years. He denies having any complaints of abdominal pain, nausea, vomiting, dysphagia or odynophagia. He denies having a family history of colon cancer, celiac sprue or IBD. He had a normal colonoscopy done on 04/05/2011.   Past Medical History:  Diagnosis Date   Arteriosclerotic cardiovascular disease (ASCVD) 2005   catheterization in 10/2010:50% mid LAD, diffuse distal disease, circumflex irregularities, large dominant RCA with a 50% ostial, 70% distal, 60% posterolateral and 70% PDA; normal EF   Arthritis    Benign prostatic hypertrophy    Bilateral carpal tunnel syndrome 07/03/2018   Cerebrovascular disease 2010   R. carotid endarterectomy; Duplex in 10/2010-widely patent ICAs, subtotal left vertebral-not thought to be contributing to symptoms   Cervical spine disease    CT in 2012-advanced degeneration and spondylosis with moderate spinal stenosis at C3-C6   CHF (congestive heart failure) ([place])    Depression    Diabetes mellitus without complication ([place])    Erectile dysfunction    Family history of breast cancer    Family history of cancer of mouth    Family history of CML (chronic monocytic leukemia)    Family history of lung cancer    Family history of ovarian cancer    Family history of stomach cancer    Gastroesophageal reflux disease    H/O hiatal hernia    H/O: substance abuse ([place])    Cocaine, marijuana, alcohol.  Quit 2013.    Hyperlipidemia    Hypertension    Non-ST elevation myocardial infarction (NSTEMI), initial episode of care ([place]) 12/02/2013   DES  LAD   Obesity    Prostate cancer ([place])    Sleep apnea    CPAP   Tachy-brady syndrome ([place])    a. 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 25, 2010    CEA   CORONARY ANGIOPLASTY WITH STENT PLACEMENT  12/03/2013   LAD 90%-->0% W/ Promus Premier DES 3.0 mm x 16 mm, CFX OK, RCA 40%, EF 70-75%   LEFT ATRIAL APPENDAGE OCCLUSION N/A 08/05/2015   Procedure: LEFT ATRIAL APPENDAGE OCCLUSION;  Surgeon: [name], MD;  Location: [place] [facility];  Service: Cardiovascular;  Laterality: N/A;   LEFT HEART CATHETERIZATION WITH CORONARY ANGIOGRAM Left 12/03/2013   Procedure: LEFT HEART CATHETERIZATION WITH CORONARY ANGIOGRAM;  Surgeon: Leonie Man, MD;  Location: [facility] CATH LAB;  Service: Cardiovascular;  Laterality: Left;   LEFT HEART CATHETERIZATION WITH CORONARY ANGIOGRAM N/A 01/26/2014   Procedure: LEFT HEART CATHETERIZATION WITH CORONARY ANGIOGRAM;  Surgeon: Jettie Booze, MD;  Location: [facility] CATH LAB;  Service:  Cardiovascular;  Laterality: N/A;   LEFT HEART CATHETERIZATION WITH CORONARY ANGIOGRAM N/A 08/03/2014   Procedure: LEFT HEART CATHETERIZATION WITH CORONARY ANGIOGRAM;  Surgeon: Burnell Blanks, MD;  Location: [facility] CATH LAB;  Service: Cardiovascular;  Laterality: N/A;   PERCUTANEOUS CORONARY STENT INTERVENTION (PCI-S)  12/03/2013   Procedure: PERCUTANEOUS CORONARY STENT INTERVENTION (PCI-S);  Surgeon: Leonie Man, MD;  Location: [facility] CATH LAB;  Service: Cardiovascular;;   PERMANENT PACEMAKER INSERTION N/A 09/18/2014   Procedure: PERMANENT PACEMAKER INSERTION;  Surgeon: [name], MD;  Location: [facility] CATH LAB;   Service: Cardiovascular;  Laterality: N/A;   RADIOFREQUENCY ABLATION  2005   for PSVT   TEE WITHOUT CARDIOVERSION N/A 07/27/2015   Procedure: TRANSESOPHAGEAL ECHOCARDIOGRAM (TEE);  Surgeon: Lelon Perla, MD;  Location[facility] ENDOSCOPY;  Service: Cardiovascular;  Laterality: N/A;   TEE WITHOUT CARDIOVERSION N/A 09/15/2015   Procedure: TRANSESOPHAGEAL ECHOCARDIOGRAM (TEE);  Surgeon: Thayer Headings, MD;  Location: [place];  Service: Cardiovascular;  Laterality: N/A;    Family History  Problem Relation Age of Onset   Hypertension Mother        Cerebrovascular disease   Diabetes Mother    Coronary artery disease Father 9   Diabetes type II Father    Hypertension Father    Heart attack Father    Diabetes Brother    Hypertension Brother    Diabetes Sister    Hypertension Sister    Heart attack Sister 10   Leukemia Sister 40       CML   Breast cancer Maternal Grandmother        dx 49s   Lung cancer Maternal Uncle        dx >50, smoker   Cancer Cousin        mouth cancer, dx 32s, no smoking/chew tobacco hx (maternal 1st cousin)   Ovarian cancer Cousin        dx <50 (maternal 1st cousin)   Stomach cancer Cousin        dx 55s (maternal 1st cousin)   Cancer Cousin        type unknown to pt, dx >50 (paternal 1st cousin)    Social History:  reports that he quit smoking about 8 years ago. His smoking use included cigarettes. He started smoking about 48 years ago. He has a 40.00 pack-year smoking history. He has never used smokeless tobacco. He reports current alcohol use. He reports that he does not use drugs.  Allergies:  Allergies  Allergen Reactions   Trazodone And Nefazodone     Nightmares   Lactose Intolerance (Gi) Other (See Comments)    UPSET STOMACH     Medications: Scheduled: Continuous:  sodium chloride     lactated ringers      No results found. However, due to the size of the patient record, not all encounters were searched. Please check Results Review for  a complete set of results.   No results found.  ROS:  As stated above in the HPI otherwise negative.  There were no vitals taken for this visit.    PE: Gen: NAD, Alert and Oriented HEENT:  [place]/AT, EOMI Neck: Supple, no LAD Lungs: CTA Bilaterally CV: RRR without M/G/R ABD: Soft, NTND, +BS Ext: No C/C/E  Assessment/Plan: 1) Screening colonoscopy.  Delorice Bannister D 06/24/2021, 9:06 AM

## 2021-06-27 ENCOUNTER — Other Ambulatory Visit (HOSPITAL_COMMUNITY): Payer: Medicare Other

## 2021-06-27 ENCOUNTER — Encounter (HOSPITAL_COMMUNITY): Payer: Self-pay | Admitting: Gastroenterology

## 2021-06-27 LAB — SURGICAL PATHOLOGY

## 2021-06-28 LAB — COMPREHENSIVE METABOLIC PANEL
ALT: 6 IU/L (ref 0–44)
AST: 22 IU/L (ref 0–40)
Albumin/Globulin Ratio: 1.2 (ref 1.2–2.2)
Albumin: 3.6 g/dL — ABNORMAL LOW (ref 3.8–4.8)
Alkaline Phosphatase: 96 IU/L (ref 44–121)
BUN/Creatinine Ratio: 8 — ABNORMAL LOW (ref 10–24)
BUN: 8 mg/dL (ref 8–27)
Bilirubin Total: 0.4 mg/dL (ref 0.0–1.2)
CO2: 21 mmol/L (ref 20–29)
Calcium: 9.1 mg/dL (ref 8.6–10.2)
Chloride: 105 mmol/L (ref 96–106)
Creatinine, Ser: 1.05 mg/dL (ref 0.76–1.27)
Globulin, Total: 3.1 g/dL (ref 1.5–4.5)
Glucose: 156 mg/dL — ABNORMAL HIGH (ref 70–99)
Potassium: 3.9 mmol/L (ref 3.5–5.2)
Sodium: 140 mmol/L (ref 134–144)
Total Protein: 6.7 g/dL (ref 6.0–8.5)
eGFR: 79 mL/min/{1.73_m2} (ref >59)

## 2021-06-28 LAB — TSH: TSH: 13.7 u[IU]/mL — ABNORMAL HIGH (ref 0.450–4.500)

## 2021-06-28 LAB — T4, FREE: Free T4: 1.36 ng/dL (ref 0.82–1.77)

## 2021-06-29 ENCOUNTER — Other Ambulatory Visit (HOSPITAL_COMMUNITY): Payer: Self-pay | Admitting: Hematology

## 2021-06-29 ENCOUNTER — Other Ambulatory Visit (HOSPITAL_COMMUNITY): Payer: Self-pay

## 2021-06-29 MED ORDER — ERLEADA 60 MG PO TABS
ORAL_TABLET | ORAL | 3 refills | Status: DC
  Filled 2021-06-29: qty 120, 30d supply, fill #0
  Filled 2021-08-04: qty 120, 30d supply, fill #1
  Filled 2021-08-23: qty 120, 30d supply, fill #2
  Filled 2021-09-26: qty 120, 30d supply, fill #3

## 2021-07-04 ENCOUNTER — Other Ambulatory Visit (HOSPITAL_COMMUNITY): Payer: Self-pay

## 2021-07-11 ENCOUNTER — Other Ambulatory Visit: Payer: Self-pay

## 2021-07-11 ENCOUNTER — Other Ambulatory Visit (HOSPITAL_COMMUNITY): Payer: Self-pay

## 2021-07-11 ENCOUNTER — Ambulatory Visit (INDEPENDENT_AMBULATORY_CARE_PROVIDER_SITE_OTHER): Payer: Medicare Other | Admitting: Nurse Practitioner

## 2021-07-11 ENCOUNTER — Ambulatory Visit: Payer: Medicare Other | Admitting: Nurse Practitioner

## 2021-07-11 ENCOUNTER — Encounter: Payer: Self-pay | Admitting: Nurse Practitioner

## 2021-07-11 VITALS — BP 113/70 | HR 65 | Ht 70.0 in | Wt 310.6 lb

## 2021-07-11 DIAGNOSIS — E1165 Type 2 diabetes mellitus with hyperglycemia: Secondary | ICD-10-CM | POA: Diagnosis not present

## 2021-07-11 DIAGNOSIS — E890 Postprocedural hypothyroidism: Secondary | ICD-10-CM

## 2021-07-11 LAB — POCT GLYCOSYLATED HEMOGLOBIN (HGB A1C): HbA1c, POC (controlled diabetic range): 8.2 % — AB (ref 0.0–7.0)

## 2021-07-11 NOTE — Patient Instructions (Incomplete)

## 2021-07-11 NOTE — Patient Instructions (Signed)

## 2021-07-11 NOTE — Progress Notes (Signed)
07/11/2021                                                 Endocrinology Follow Up  Subjective:    Patient ID: Peter Dunlap, male    DOB: 06/22/1955, PCP Eliezer Lofts, MD  Patient presents today for follow-up of hypothyroidism following radioactive iodine treatment, and type 2 diabetes.  Past Medical History:  Diagnosis Date   Arteriosclerotic cardiovascular disease (ASCVD) 2005   catheterization in 10/2010:50% mid LAD, diffuse distal disease, circumflex irregularities, large dominant RCA with a 50% ostial, 70% distal, 60% posterolateral and 70% PDA; normal EF   Arthritis    Benign prostatic hypertrophy    Bilateral carpal tunnel syndrome 07/03/2018   Cerebrovascular disease 2010   R. carotid endarterectomy; Duplex in 10/2010-widely patent ICAs, subtotal left vertebral-not thought to be contributing to symptoms   Cervical spine disease    CT in 2012-advanced degeneration and spondylosis with moderate spinal stenosis at C3-C6   CHF (congestive heart failure) (HCC)    Depression    Diabetes mellitus without complication (HCC)    Erectile dysfunction    Family history of breast cancer    Family history of cancer of mouth    Family history of CML (chronic monocytic leukemia)    Family history of lung cancer    Family history of ovarian cancer    Family history of stomach cancer    Gastroesophageal reflux disease    H/O hiatal hernia    H/O: substance abuse (HCC)    Cocaine, marijuana, alcohol.  Quit 2013.    Hyperlipidemia    Hypertension    Non-ST elevation myocardial infarction (NSTEMI), initial episode of care (HCC) 12/02/2013   DES LAD   Obesity    Prostate cancer (HCC)    Sleep apnea    CPAP   Tachy-brady syndrome (HCC)    a. 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 25, 2010    CEA   COLONOSCOPY WITH PROPOFOL N/A 06/24/2021   Procedure: COLONOSCOPY WITH PROPOFOL;  Surgeon: Jeani Hawking, MD;  Location: [facility] ENDOSCOPY;  Service: Endoscopy;  Laterality: N/A;   CORONARY ANGIOPLASTY WITH STENT PLACEMENT  12/03/2013   LAD 90%-->0% W/ Promus Premier DES 3.0 mm x 16 mm, CFX OK, RCA 40%, EF 70-75%   LEFT ATRIAL APPENDAGE OCCLUSION N/A 08/05/2015   Procedure: LEFT ATRIAL APPENDAGE OCCLUSION;  Surgeon: [name], MD;  Location: [facility] INVASIVE CV LAB;  Service: Cardiovascular;  Laterality: N/A;   LEFT HEART CATHETERIZATION WITH CORONARY ANGIOGRAM Left 12/03/2013   Procedure: LEFT HEART CATHETERIZATION WITH CORONARY ANGIOGRAM;  Surgeon: Marykay Lex, MD;  Location: [facility] CATH LAB;  Service: Cardiovascular;  Laterality: Left;   LEFT HEART CATHETERIZATION WITH CORONARY ANGIOGRAM N/A 01/26/2014   Procedure: LEFT HEART CATHETERIZATION WITH CORONARY ANGIOGRAM;  Surgeon: Corky Crafts, MD;  Location: [facility] CATH LAB;  Service: Cardiovascular;  Laterality: N/A;  LEFT HEART CATHETERIZATION WITH CORONARY ANGIOGRAM N/A 08/03/2014   Procedure: LEFT HEART CATHETERIZATION WITH CORONARY ANGIOGRAM;  Surgeon: Burnell Blanks, MD;  Location: [facility] CATH LAB;  Service: Cardiovascular;  Laterality: N/A;   PERCUTANEOUS CORONARY STENT INTERVENTION (PCI-S)  12/03/2013   Procedure: PERCUTANEOUS CORONARY STENT INTERVENTION (PCI-S);  Surgeon: Leonie Man, MD;  Location: [facility] CATH LAB;  Service: Cardiovascular;;   PERMANENT PACEMAKER INSERTION N/A 09/18/2014   Procedure: PERMANENT PACEMAKER INSERTION;  Surgeon: [name], MD;  Location: [facility] CATH LAB;  Service: Cardiovascular;  Laterality: N/A;   POLYPECTOMY  06/24/2021   Procedure: POLYPECTOMY;  Surgeon: Carol Ada, MD;  Location: [facility] ENDOSCOPY;  Service: Endoscopy;;    RADIOFREQUENCY ABLATION  2005   for PSVT   TEE WITHOUT CARDIOVERSION N/A 07/27/2015   Procedure: TRANSESOPHAGEAL ECHOCARDIOGRAM (TEE);  Surgeon: Lelon Perla, MD;  Location: [facility] ENDOSCOPY;  Service: Cardiovascular;  Laterality: N/A;   TEE WITHOUT CARDIOVERSION N/A 09/15/2015   Procedure: TRANSESOPHAGEAL ECHOCARDIOGRAM (TEE);  Surgeon: Thayer Headings, MD;  Location: [facility] ENDOSCOPY;  Service: Cardiovascular;  Laterality: N/A;   Social History   Socioeconomic History   Marital status: Married    Spouse name: Not on file   Number of children: 0   Years of education: Not on file   Highest education level: Not on file  Occupational History   Occupation: Retired  Tobacco Use   Smoking status: Former    Packs/day: 1.00    Years: 40.00    Pack years: 40.00    Types: Cigarettes    Start date: 10/20/1972    Quit date: 10/10/2012    Years since quitting: 8.7   Smokeless tobacco: Never   Tobacco comments:    Quit in May.   Vaping Use   Vaping Use: Never used  Substance and Sexual Activity   Alcohol use: Yes    Alcohol/week: 0.0 standard drinks    Comment: former drinker-- sober since 2013.    Drug use: No    Types: Cocaine    Comment: quit cocaine 10/2011   Sexual activity: Yes    Partners: Female  Other Topics Concern   Not on file  Social History Narrative   Lives in [place].   Social Determinants of Health   Financial Resource Strain: Not on file  Food Insecurity: Not on file  Transportation Needs: Not on file  Physical Activity: Not on file  Stress: Not on file  Social Connections: Not on file   Outpatient Encounter Medications as of 07/11/2021  Medication Sig   allopurinol (ZYLOPRIM) 100 MG tablet TAKE 1 TABLET(100 MG) BY MOUTH DAILY   apalutamide (ERLEADA) 60 MG tablet TAKE 4 TABLETS (240 MG TOTAL) BY MOUTH DAILY. MAY BE TAKEN WITH OR WITHOUT FOOD. SWALLOW TABLETS WHOLE.   aspirin EC 325 MG tablet Take 1 tablet (325 mg total) daily by mouth.   Blood Glucose  Monitoring Suppl (BLOOD GLUCOSE SYSTEM PAK) KIT Use as directed to monitor FSBS 1x daily. Dx: E11.9.   CLENPIQ 10-3.5-12 MG-GM -GM/160ML SOLN Take 320 mLs by mouth as directed.   Cyanocobalamin (VITAMIN B 12) 500 MCG TABS Take 500 mcg by mouth in the morning.   diphenoxylate-atropine (LOMOTIL) 2.5-0.025 MG tablet Take 1 tablet by mouth every 6 (six) hours as needed for diarrhea or loose stools.   fluticasone (FLONASE) 50 MCG/ACT nasal spray Place 2 sprays into both nostrils daily. (Patient taking differently: Place 2 sprays into both nostrils daily as needed for allergies.)   furosemide (LASIX) 20  MG tablet TAKE 3 TABLETS BY MOUTH DAILY   glipiZIDE (GLUCOTROL XL) 5 MG 24 hr tablet TAKE 1 TABLET(5 MG) BY MOUTH DAILY WITH BREAKFAST   HOMEOPATHIC PRODUCTS EX Apply topically.   isosorbide mononitrate (IMDUR) 60 MG 24 hr tablet Take 1 tablet (60 mg total) by mouth daily.   levothyroxine (SYNTHROID) 200 MCG tablet TAKE 1 TABLET(200 MCG) BY MOUTH DAILY BEFORE AND BREAKFAST   levothyroxine (SYNTHROID) 25 MCG tablet Take 25 mcg by mouth daily.   linaclotide (LINZESS) 290 MCG CAPS capsule Take 290 mcg by mouth daily before breakfast.   loratadine (CLARITIN) 10 MG tablet TAKE 1 TABLET(10 MG) BY MOUTH DAILY AS NEEDED FOR ALLERGIES   magnesium oxide (MAG-OX) 400 (240 Mg) MG tablet TAKE 1 TABLET(400 MG) BY MOUTH TWICE DAILY   metFORMIN (GLUCOPHAGE) 500 MG tablet Take 1 tablet (500 mg total) by mouth daily with breakfast.   naloxone (NARCAN) nasal spray 4 mg/0.1 mL Place 4 mg into the nose as needed (accidental overdose).   nitroGLYCERIN (NITROSTAT) 0.4 MG SL tablet PLACE 1 TABLET UNDER THE TONGUE EVERY 5 MINUTES AS NEEDED FOR CHEST PAIN. CALL 911 AT THIRD DOSE IN 15 MINUTES   ONETOUCH ULTRA test strip USE TO TEST ONCE DAILY   oxyCODONE-acetaminophen (PERCOCET) 10-325 MG tablet Take 1 tablet by mouth every 8 (eight) hours as needed for pain.   pantoprazole (PROTONIX) 40 MG tablet TAKE 1 TABLET(40 MG) BY MOUTH  DAILY   rosuvastatin (CRESTOR) 40 MG tablet Take 40 mg by mouth every evening.   silodosin (RAPAFLO) 8 MG CAPS capsule TAKE 1 CAPSULE(8 MG) BY MOUTH TWICE DAILY   sotalol (BETAPACE) 80 MG tablet Take 2 tablets (160 mg) in the AM and 1 tablet (80 mg) in the PM. Please make yearly appt with Dr. Lovena Le for February 2023 for future refills. Thank you 1st attempt   Vitamin D, Ergocalciferol, (DRISDOL) 1.25 MG (50000 UNIT) CAPS capsule TAKE 1 CAPSULE BY MOUTH EVERY 7 DAYS (Patient taking differently: Take 50,000 Units by mouth every Thursday.)   No facility-administered encounter medications on file as of 07/11/2021.   ALLERGIES: Allergies  Allergen Reactions   Trazodone And Nefazodone     Nightmares   Lactose Intolerance (Gi) Other (See Comments)    UPSET STOMACH    VACCINATION STATUS: Immunization History  Administered Date(s) Administered   Fluad Quad(high Dose 65+) 03/24/2021   Influenza,inj,Quad PF,6+ Mos 02/17/2014, 03/09/2015, 03/10/2016, 03/27/2017, 03/20/2018, 03/04/2019, 03/15/2020   Moderna Sars-Covid-2 Vaccination 08/03/2019, 09/06/2019   Pneumococcal Polysaccharide-23 01/05/2014   Tdap 12/19/2010   Zoster Recombinat (Shingrix) 12/17/2019, 02/15/2020    Diabetes He presents for his follow-up diabetic visit. He has type 2 diabetes mellitus. His disease course has been worsening. There are no hypoglycemic associated symptoms. Pertinent negatives for hypoglycemia include no headaches, nervousness/anxiousness or tremors. Associated symptoms include fatigue. Pertinent negatives for diabetes include no chest pain, no foot paresthesias, no polydipsia, no polyuria and no weight loss. There are no hypoglycemic complications. Symptoms are stable. Diabetic complications include heart disease, impotence, nephropathy and peripheral neuropathy. Risk factors for coronary artery disease include hypertension, diabetes mellitus, dyslipidemia, male sex, obesity, tobacco exposure and sedentary  lifestyle. Current diabetic treatment includes oral agent (dual therapy). He is compliant with treatment most of the time. His weight is fluctuating minimally. He is following a generally unhealthy diet. When asked about meal planning, he reported none. He has not had a previous visit with a dietitian. He never participates in exercise. (He presents today with no meter  or logs to review.  His POCT A1c today is 8.2%, increasing from last visit of 6.7%.  He reports he did overindulge in foods he knows he shouldn't during the holiday season, admits to drinking koolaid.  He has not been monitoring glucose routinely either.  He denies any symptoms of hypoglycemia.) An ACE inhibitor/angiotensin II receptor blocker is not being taken. He does not see a podiatrist.Eye exam is not current.  Thyroid Problem Presents for follow-up (He was given RAI therapy on March 16, 2014. - he denies family history of thyroid dysfunction .) visit. Symptoms include fatigue. Patient reports no anxiety, cold intolerance, constipation, diarrhea, heat intolerance, leg swelling, palpitations, tremors, weight gain or weight loss. The symptoms have been stable.     Review of systems  Constitutional: + Minimally fluctuating body weight,  current Body mass index is 44.57 kg/m. , no fatigue, no subjective hyperthermia, no subjective hypothermia Eyes: no blurry vision, no xerophthalmia ENT: no sore throat, no nodules palpated in throat, no dysphagia/odynophagia, no hoarseness Cardiovascular: no chest pain, no shortness of breath, no palpitations, no leg swelling Respiratory: no cough, no shortness of breath Gastrointestinal: no nausea/vomiting/diarrhea Musculoskeletal: no muscle/joint aches Skin: no rashes, no hyperemia Neurological: no tremors, no numbness, no tingling, no dizziness Psychiatric: no depression, no anxiety    Objective:    BP 113/70    Pulse 65    Ht $R'5\' 10"'EW$  (1.778 m)    Wt (!) 310 lb 9.6 oz (140.9 kg)    SpO2  99%    BMI 44.57 kg/m   Wt Readings from Last 3 Encounters:  07/11/21 (!) 310 lb 9.6 oz (140.9 kg)  06/24/21 (!) 307 lb 1.6 oz (139.3 kg)  06/20/21 (!) 307 lb 1.6 oz (139.3 kg)    BP Readings from Last 3 Encounters:  07/11/21 113/70  06/24/21 131/73  06/20/21 113/72     Physical Exam- Limited  Constitutional:  Body mass index is 44.57 kg/m. , not in acute distress, normal state of mind Eyes:  EOMI, no exophthalmos Neck: Supple Cardiovascular: RRR, no murmurs, rubs, or gallops, no edema Respiratory: Adequate breathing efforts, no crackles, rales, rhonchi, or wheezing Musculoskeletal: no gross deformities, strength intact in all four extremities, no gross restriction of joint movements Skin:  no rashes, no hyperemia Neurological: no tremor with outstretched hands    Diabetic Labs (most recent): Lab Results  Component Value Date   HGBA1C 8.2 (A) 07/11/2021   HGBA1C 6.7 03/09/2021   HGBA1C 6.6 (A) 12/07/2020     Lipid Panel ( most recent) Lipid Panel     Component Value Date/Time   CHOL 294 (H) 09/08/2020 0000   TRIG 233 (H) 09/08/2020 0000   HDL 34 (L) 09/08/2020 0000   CHOLHDL 8.6 (H) 09/08/2020 0000   VLDL 28 10/17/2016 0819   LDLCALC 217 (H) 09/08/2020 0000    Recent Results (from the past 2160 hour(s))  CUP PACEART REMOTE DEVICE CHECK     Status: None   Collection Time: 05/11/21  2:00 AM  Result Value Ref Range   Date Time Interrogation Session [id number]    Pulse Generator Manufacturer [employer]    Pulse Gen Model 2240 Assurity    Pulse Gen Serial Number [id number]    Clinic Name [facility]    Implantable Pulse Generator Type Implantable Pulse Generator    Implantable Pulse Generator Implant Date [id number]    Implantable Lead Manufacturer [employer]    Implantable Lead Model 1688TC Tendril SDX    Implantable Lead Serial  Number [id number]    Implantable Lead Implant Date 59163846    Implantable Lead Location Detail 1 UNKNOWN    Implantable Lead Location [phone]     Implantable Lead Manufacturer [facility]    Implantable Lead Model 1888TC Tendril ST Optim    Implantable Lead Serial Number [id number]    Implantable Lead Implant Date 30092330    Implantable Lead Location Detail 1 UNKNOWN    Implantable Lead Location [phone]    Lead Channel Setting Sensing Sensitivity 2.0 mV   Lead Channel Setting Sensing Adaptation Mode Fixed Pacing    Lead Channel Setting Pacing Amplitude 2.0 V   Lead Channel Setting Pacing Pulse Width 0.5 ms   Lead Channel Setting Pacing Amplitude 2.5 V   Lead Channel Status     Lead Channel Impedance Value 430 ohm   Lead Channel Sensing Intrinsic Amplitude 3.9 mV   Lead Channel Pacing Threshold Amplitude 0.75 V   Lead Channel Pacing Threshold Pulse Width 0.5 ms   Lead Channel Status     Lead Channel Impedance Value 550 ohm   Lead Channel Sensing Intrinsic Amplitude 12.0 mV   Lead Channel Pacing Threshold Amplitude 0.75 V   Lead Channel Pacing Threshold Pulse Width 0.5 ms   Battery Status MOS    Battery Remaining Longevity 52 mo   Battery Remaining Percentage 43.0 %   Battery Voltage 2.98 V   Brady Statistic RA Percent Paced 31.0 %   Brady Statistic RV Percent Paced 1.0 %   Brady Statistic AP VP Percent 1.0 %   Brady Statistic AS VP Percent 1.0 %   Brady Statistic AP VS Percent 31.0 %   Brady Statistic AS VS Percent 68.0 %  CBC with Differential     Status: Abnormal   Collection Time: 06/20/21  1:07 PM  Result Value Ref Range   WBC 5.5 4.0 - 10.5 K/uL   RBC 3.38 (L) 4.22 - 5.81 MIL/uL   Hemoglobin 11.3 (L) 13.0 - 17.0 g/dL   HCT 33.6 (L) 39.0 - 52.0 %   MCV 99.4 80.0 - 100.0 fL   MCH 33.4 26.0 - 34.0 pg   MCHC 33.6 30.0 - 36.0 g/dL   RDW 14.8 11.5 - 15.5 %   Platelets 117 (L) 150 - 400 K/uL    Comment: Immature Platelet Fraction may be clinically indicated, consider ordering this additional test [id number]    nRBC 0.0 0.0 - 0.2 %   Neutrophils Relative % 57 %   Neutro Abs 3.2 1.7 - 7.7 K/uL   Lymphocytes  Relative 29 %   Lymphs Abs 1.6 0.7 - 4.0 K/uL   Monocytes Relative 10 %   Monocytes Absolute 0.5 0.1 - 1.0 K/uL   Eosinophils Relative 2 %   Eosinophils Absolute 0.1 0.0 - 0.5 K/uL   Basophils Relative 2 %   Basophils Absolute 0.1 0.0 - 0.1 K/uL   Immature Granulocytes 0 %   Abs Immature Granulocytes 0.01 0.00 - 0.07 K/uL    Comment: Performed at [facility], [address], [place], [place] [address]  Comprehensive metabolic panel     Status: Abnormal   Collection Time: 06/20/21  1:07 PM  Result Value Ref Range   Sodium 136 135 - 145 mmol/L   Potassium 3.8 3.5 - 5.1 mmol/L   Chloride 106 98 - 111 mmol/L   CO2 23 22 - 32 mmol/L   Glucose, Bld 231 (H) 70 - 99 mg/dL    Comment: Glucose reference range applies only to samples  taken after fasting for at least 8 hours.   BUN 13 8 - 23 mg/dL   Creatinine, Ser 1.19 0.61 - 1.24 mg/dL   Calcium 8.8 (L) 8.9 - 10.3 mg/dL   Total Protein 7.6 6.5 - 8.1 g/dL   Albumin 3.5 3.5 - 5.0 g/dL   AST 31 15 - 41 U/L   ALT 15 0 - 44 U/L   Alkaline Phosphatase 89 38 - 126 U/L   Total Bilirubin 0.4 0.3 - 1.2 mg/dL   GFR, Estimated >60 >60 mL/min    Comment: (NOTE) Calculated using the CKD-EPI Creatinine Equation (2021)    Anion gap 7 5 - 15    Comment: Performed at [facility], [address], [place], [place] [address]  PSA     Status: None   Collection Time: 06/20/21  1:07 PM  Result Value Ref Range   Prostatic Specific Antigen 0.01 0.00 - 4.00 ng/mL    Comment: (NOTE) While PSA levels of <=4.0 ng/ml are reported as reference range, some men with levels below 4.0 ng/ml can have prostate cancer and many men with PSA above 4.0 ng/ml do not have prostate cancer.  Other tests such as free PSA, age specific reference ranges, PSA velocity and PSA doubling time may be helpful especially in men less than 6 years old. Performed at [facility], [place] [address], [place], [place] [address]   Testosterone     Status: Abnormal   Collection Time:  06/20/21  1:07 PM  Result Value Ref Range   Testosterone 12 (L) 264 - 916 ng/dL    Comment: (NOTE) Adult male reference interval is based on a population of healthy nonobese males (BMI <30) between 75 and 31 years old. [place], [place] [phone]. PMID: [id number]. Performed At: [facility] [place], [place] [id number] Rush Farmer MD [id number]   Vitamin D 25 hydroxy     Status: None   Collection Time: 06/20/21  1:07 PM  Result Value Ref Range   Vit D, 25-Hydroxy 79.22 30 - 100 ng/mL    Comment: (NOTE) Vitamin D deficiency has been defined by the Institute of Medicine  and an Endocrine Society practice guideline as a level of serum 25-OH  vitamin D less than 20 ng/mL (1,2). The Endocrine Society went on to  further define vitamin D insufficiency as a level between 21 and 29  ng/mL (2).  1. IOM (Institute of Medicine). 2010. Dietary reference intakes for  calcium and D. [place]ardo: The [employer]. 2. Holick MF, Binkley , Bischoff-Ferrari HA, et al. Evaluation,  treatment, and prevention of vitamin D deficiency: an Endocrine  Society clinical practice guideline, JCEM. 2011 Jul; 96(7): 1911-30.  Performed at [facility], [place] [address], [place], [place] [address]   Glucose, capillary     Status: Abnormal   Collection Time: 06/24/21  9:23 AM  Result Value Ref Range   Glucose-Capillary 146 (H) 70 - 99 mg/dL    Comment: Glucose reference range applies only to samples taken after fasting for at least 8 hours.  Surgical pathology     Status: None   Collection Time: 06/24/21  9:56 AM  Result Value Ref Range   SURGICAL PATHOLOGY      SURGICAL PATHOLOGY CASE: [id number] PATIENT: Roslyn Smiling Surgical Pathology Report     Clinical History: Screening (jmc)     FINAL MICROSCOPIC DIAGNOSIS:  A. COLON, ASCENDING, TRANSVERSE, POLYPECTOMY: Tubular adenomas, multiple fragments Negative for high-grade  dysplasia  and carcinoma  GROSS DESCRIPTION:  Received in formalin are tan, soft tissue fragments that are submitted in toto. Number: 10 pieces size: 0.2 to 0.7 cm in greatest dimension blocks: 1 (KW, 06/24/2021)    Final Diagnosis performed by Tobin Chad, MD.   Electronically signed 06/27/2021 Technical component performed at [facility], [place] [address] [place], [place] [address].  Professional component performed at [employer]. [facility], [place] [address] [place], [place] [address].  Immunohistochemistry Technical component (if applicable) was performed at [facility]. [address] [place], [place], [place] [address].   IMMUNOHISTOC HEMISTRY DISCLAIMER (if applicable): Some of these immunohistochemical stains may have been developed and the performance characteristics determine by [facility]. Some may not have been cleared or approved by the U.S. Food and Drug Administration. The FDA has determined that such clearance or approval is not necessary. This test is used for clinical purposes. It should not be regarded as investigational or for research. This laboratory is certified under the [facility] (CLIA-88) as qualified to perform high complexity clinical laboratory testing.  The controls stained appropriately.   Comprehensive metabolic panel     Status: Abnormal   Collection Time: 06/27/21 10:00 AM  Result Value Ref Range   Glucose 156 (H) 70 - 99 mg/dL   BUN 8 8 - 27 mg/dL   Creatinine, Ser 1.05 0.76 - 1.27 mg/dL   eGFR 79 >59 mL/min/1.73   BUN/Creatinine Ratio 8 (L) 10 - 24   Sodium 140 134 - 144 mmol/L   Potassium 3.9 3.5 - 5.2 mmol/L   Chloride 105 96 - 106 mmol/L   CO2 21 20 - 29 mmol/L   Calcium 9.1 8.6 - 10.2 mg/dL   Total Protein 6.7 6.0 - 8.5 g/dL   Albumin 3.6 (L) 3.8 - 4.8 g/dL   Globulin, Total 3.1 1.5 - 4.5 g/dL   Albumin/Globulin Ratio 1.2 1.2 - 2.2    Bilirubin Total 0.4 0.0 - 1.2 mg/dL   Alkaline Phosphatase 96 44 - 121 IU/L   AST 22 0 - 40 IU/L   ALT 6 0 - 44 IU/L  TSH     Status: Abnormal   Collection Time: 06/27/21 10:00 AM  Result Value Ref Range   TSH 13.700 (H) 0.450 - 4.500 uIU/mL  T4, free     Status: None   Collection Time: 06/27/21 10:00 AM  Result Value Ref Range   Free T4 1.36 0.82 - 1.77 ng/dL  POCT glycosylated hemoglobin (Hb A1C)     Status: Abnormal   Collection Time: 07/11/21  1:22 PM  Result Value Ref Range   Hemoglobin A1C     HbA1c POC (<> result, manual entry)     HbA1c, POC (prediabetic range)     HbA1c, POC (controlled diabetic range) 8.2 (A) 0.0 - 7.0 %     Assessment & Plan:   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  He is advised to continue his current dose of Levothyroxine 225 mcg po daily before breakfast (close to max weight based calculation of total dose).  He says he did miss a few doses.   - We discussed about the correct intake of his thyroid hormone, on empty stomach at fasting, with water, separated by at least 30 minutes from breakfast and other medications,  and separated by more than 4 hours from calcium, iron, multivitamins, acid reflux medications (  PPIs). -Patient is made aware of the fact that thyroid hormone replacement is needed for life, dose to be adjusted by periodic monitoring of thyroid function tests.  2. Diabetes mellitus type 2 in obese [facility])  He presents today with no meter or logs to review.  His POCT A1c today is 8.2%, increasing from last visit of 6.7%.  He reports he did overindulge in foods he knows he shouldn't during the holiday season, admits to drinking koolaid.  He has not been monitoring glucose routinely either.  He denies any symptoms of hypoglycemia.  - Nutritional counseling repeated at each appointment due to patients tendency to fall back in to old habits.  - The patient admits there is a  room for improvement in their diet and drink choices. -  Suggestion is made for the patient to avoid simple carbohydrates from their diet including Cakes, Sweet Desserts / Pastries, Ice Cream, Soda (diet and regular), Sweet Tea, Candies, Chips, Cookies, Sweet Pastries, Store Bought Juices, Alcohol in Excess of 1-2 drinks a day, Artificial Sweeteners, Coffee Creamer, and "Sugar-free" Products. This will help patient to have stable blood glucose profile and potentially avoid unintended weight gain.   - I encouraged the patient to switch to unprocessed or minimally processed complex starch and increased protein intake (animal or plant source), fruits, and vegetables.   - Patient is advised to stick to a routine mealtimes to eat 3 meals a day and avoid unnecessary snacks (to snack only to correct hypoglycemia).  - He is advised to continue Metformin 500 mg p.o. daily with breakfast and Glipizide to 5 mg XL daily with breakfast.   -He is encouraged to monitor glucose 1-2 times per day, before breakfast and before bed, and call the clinic if he has readings less than 70 or greater than 300 for 3 tests in a row.  -He will be considered for incretin therapy on subsequent visits.  3. Essential hypertension His blood pressure is controlled to target.  He is advised to continue Lasix 60 mg po daily.  4.  Hyperlipidemia:  His most recent lipid panel from 09/08/20 shows uncontrolled LDL at 217 and elevated triglycerides of 233.  He is advised to continue Simvastatin 40 mg po daily at bedtime.  Side effects and precautions discussed with him.  Will recheck lipid panel prior to next visit.  5. Vitamin D Deficiency: His most recent vitamin D level on 06/20/21 shows improvement to 79.22.  He has completed replenishment with ergocalciferol.  No need for additional supplementation at this time.   - I advised patient to maintain close follow up with Dulce Sellar, MD for primary care needs.      I spent 30  minutes in the care of the patient today including review of labs from [facility], Lipids, Thyroid Function, Hematology (current and previous including abstractions from other facilities); face-to-face time discussing  his blood glucose readings/logs, discussing hypoglycemia and hyperglycemia episodes and symptoms, medications doses, his options of short and long term treatment based on the latest standards of care / guidelines;  discussion about incorporating lifestyle medicine;  and documenting the encounter.    Please refer to Patient Instructions for Blood Glucose Monitoring and Insulin/Medications Dosing Guide"  in media tab for additional information. Please  also refer to " Patient Self Inventory" in the Media  tab for reviewed elements of pertinent patient history.  [name] participated in the discussions, expressed understanding, and voiced agreement with the above plans.  All questions were  answered to his satisfaction. he is encouraged to contact clinic should he have any questions or concerns prior to his return visit.    Follow up plan: Return in about 3 months (around 10/08/2021) for Diabetes F/U with A1c in office, Thyroid follow up, Previsit labs, Bring meter and logs.  Rayetta Pigg, [facility] [address] [place], [place] [address] Phone: [phone] Fax: [phone]   07/11/2021, 1:31 PM

## 2021-07-18 ENCOUNTER — Inpatient Hospital Stay (HOSPITAL_COMMUNITY): Payer: Medicare Other | Attending: Hematology

## 2021-07-18 ENCOUNTER — Encounter (HOSPITAL_COMMUNITY): Payer: Self-pay

## 2021-07-18 ENCOUNTER — Other Ambulatory Visit: Payer: Self-pay

## 2021-07-18 VITALS — BP 101/67 | HR 63 | Temp 97.5°F | Resp 20

## 2021-07-18 DIAGNOSIS — Z79899 Other long term (current) drug therapy: Secondary | ICD-10-CM | POA: Diagnosis not present

## 2021-07-18 DIAGNOSIS — Z5111 Encounter for antineoplastic chemotherapy: Secondary | ICD-10-CM | POA: Insufficient documentation

## 2021-07-18 DIAGNOSIS — Z191 Hormone sensitive malignancy status: Secondary | ICD-10-CM

## 2021-07-18 DIAGNOSIS — C61 Malignant neoplasm of prostate: Secondary | ICD-10-CM | POA: Insufficient documentation

## 2021-07-18 MED ORDER — DEGARELIX ACETATE 80 MG ~~LOC~~ SOLR
80.0000 mg | Freq: Once | SUBCUTANEOUS | Status: AC
  Administered 2021-07-18: 80 mg via SUBCUTANEOUS
  Filled 2021-07-18: qty 4

## 2021-07-18 NOTE — Progress Notes (Signed)
Patient tolerated Firmagon injection with no complaints voiced. Site clean and dry with no bruising or swelling noted at site. See MAR for details. Band aid applied.  Patient stable during and after injection. VSS with discharge and left in satisfactory condition with no s/s of distress noted. 

## 2021-07-18 NOTE — Patient Instructions (Signed)
[facility]  Discharge Instructions: Thank you for choosing [facility] to provide your oncology and hematology care.  If you have a lab appointment with the [facility], please come in thru the Main Entrance and check in at the main information desk.  Wear comfortable clothing and clothing appropriate for easy access to any Portacath or PICC line.   We strive to give you quality time with your provider. You may need to reschedule your appointment if you arrive late (15 or more minutes).  Arriving late affects you and other patients whose appointments are after yours.  Also, if you miss three or more appointments without notifying the office, you may be dismissed from the clinic at the providers discretion.      For prescription refill requests, have your pharmacy contact our office and allow 72 hours for refills to be completed.    Today you received the following Firmagon injection, return as scheduled.   To help prevent nausea and vomiting after your treatment, we encourage you to take your nausea medication as directed.  BELOW ARE SYMPTOMS THAT SHOULD BE REPORTED IMMEDIATELY: *FEVER GREATER THAN 100.4 F (38 C) OR HIGHER *CHILLS OR SWEATING *NAUSEA AND VOMITING THAT IS NOT CONTROLLED WITH YOUR NAUSEA MEDICATION *UNUSUAL SHORTNESS OF BREATH *UNUSUAL BRUISING OR BLEEDING *URINARY PROBLEMS (pain or burning when urinating, or frequent urination) *BOWEL PROBLEMS (unusual diarrhea, constipation, pain near the anus) TENDERNESS IN MOUTH AND THROAT WITH OR WITHOUT PRESENCE OF ULCERS (sore throat, sores in mouth, or a toothache) UNUSUAL RASH, SWELLING OR PAIN  UNUSUAL VAGINAL DISCHARGE OR ITCHING   Items with * indicate a potential emergency and should be followed up as soon as possible or go to the Emergency Department if any problems should occur.  Please show the CHEMOTHERAPY ALERT CARD or IMMUNOTHERAPY ALERT CARD at check-in to the Emergency Department and  triage nurse.  Should you have questions after your visit or need to cancel or reschedule your appointment, please contact [facility] [phone]  and follow the prompts.  Office hours are 8:00 a.m. to 4:30 p.m. Monday - Friday. Please note that voicemails left after 4:00 p.m. may not be returned until the following business day.  We are closed weekends and major holidays. You have access to a nurse at all times for urgent questions. Please call the main number to the clinic [phone] and follow the prompts.  For any non-urgent questions, you may also contact your provider using MyChart. We now offer e-Visits for anyone 66 and older to request care online for non-urgent symptoms. For details visit [email or web address].   Also download the MyChart app! Go to the app store, search "MyChart", open the app, select [facility], and log in with your MyChart username and password.  Due to Covid, a mask is required upon entering the hospital/clinic. If you do not have a mask, one will be given to you upon arrival. For doctor visits, patients may have 1 support person aged 66 or older with them. For treatment visits, patients cannot have anyone with them due to current Covid guidelines and our immunocompromised population.

## 2021-07-25 ENCOUNTER — Other Ambulatory Visit (HOSPITAL_COMMUNITY): Payer: Self-pay

## 2021-07-26 LAB — HM DIABETES EYE EXAM

## 2021-08-04 ENCOUNTER — Other Ambulatory Visit (HOSPITAL_COMMUNITY): Payer: Self-pay

## 2021-08-07 NOTE — Progress Notes (Signed)
History of Present Illness: Presents for routine follow-up  Prostate cancer:    He underwent TRUS/Bx on 8.15.2017. At that time, PSA was 9.09. Prostatic volume was 25 cc. 10/12 cores came back positive for adenocarcinoma as follows: 1 core revealed GS 3+3 5 cores revealed GS 3+4 4 cores revealed GS 4+3   He completed IMRT, 40 sessions, on 1.10.2018    9.7.2021: He is here to discuss his biopsy results.  1/12 cores-from the left mid lateral region-came back positive for adenocarcinoma.  Grading was difficult due to his prior radiation.     10.15.2021: -CTAP  with newly enlarged left iliac lymph nodes measuring 1.1 x 0.9 cm.  Hepatic steatosis with early signs of cirrhosis.   04/12/2020 -F-18 PSMA PET scan on showed radiotracer activity associated with enlarged left external iliac lymph node 9 mm with SUV 7.6.  Small left common iliac lymph node 6 mm, SUV 8.6.  Left periaortic lymph node 5 mm, SUV 6.3.  Lymph node between IVC and aorta at the level of right renal hilum, SUV 8.6.  Lymph node deep to the IVC measuring 6 mm, SUV 5.4.  No skeletal metastasis.   11.17.2021: Began androgen deprivation therapy with [place].   7.5.2022:  He is on [place] on a monthly basis.  He is also on [place].  Most recent PSA is less than 0.1.  Testosterone level 5.   11.9.2022: PSA < 0.01. T level castrate.  Still on monthly Firmagon 80 mg as well as Erleada.     BPH     4.16.2019: He still has moderate voiding symptomatology despite being on Rapaflo. No gross hematuria or dysuria, however. IPSS 25 Rapaflo increased to BID.   10.22.2019: Current IPSS 27 QOl score 2--only on 1 Rapaflo a day   1.27.2020: The patient is still having significant lower urinary tract symptoms. Rapaflo increased to Q 12 hrs   5.12.2020: IPSS is 21. Quality of life score 2. He is on generic Rapaflo twice a day.     7.5.2022:  IPSS 23, quality-of-life score 3.  He is on silodosin twice a day.  Residual urine  volume today 7 mL.  11.8.2022: Still with bothersome lower urinary tract symptoms.  Frequency, urgency, nocturia x5.  When he does have a full bladder he has a better stream.  No dysuria.  Past Medical History:  Diagnosis Date   Arteriosclerotic cardiovascular disease (ASCVD) 2005   catheterization in 10/2010:50% mid LAD, diffuse distal disease, circumflex irregularities, large dominant RCA with a 50% ostial, 70% distal, 60% posterolateral and 70% PDA; normal EF   Arthritis    Benign prostatic hypertrophy    Bilateral carpal tunnel syndrome 07/03/2018   Cerebrovascular disease 2010   R. carotid endarterectomy; Duplex in 10/2010-widely patent ICAs, subtotal left vertebral-not thought to be contributing to symptoms   Cervical spine disease    CT in 2012-advanced degeneration and spondylosis with moderate spinal stenosis at C3-C6   CHF (congestive heart failure) (HCC)    Depression    Diabetes mellitus without complication (HCC)    Erectile dysfunction    Family history of breast cancer    Family history of cancer of mouth    Family history of CML (chronic monocytic leukemia)    Family history of lung cancer    Family history of ovarian cancer    Family history of stomach cancer    Gastroesophageal reflux disease    H/O hiatal hernia    H/O: substance abuse (HCC)  Cocaine, marijuana, alcohol.  Quit 2013.    Hyperlipidemia    Hypertension    Non-ST elevation myocardial infarction (NSTEMI), initial episode of care ([facility]) 12/02/2013   DES LAD   Obesity    Prostate cancer ([facility])    Sleep apnea    CPAP   Tachy-brady syndrome ([place])    a. 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 25, 2010    CEA   COLONOSCOPY WITH PROPOFOL N/A 06/24/2021   Procedure: COLONOSCOPY WITH PROPOFOL;  Surgeon: Carol Ada, MD;  Location: [facility] ENDOSCOPY;  Service: Endoscopy;  Laterality: N/A;   CORONARY ANGIOPLASTY WITH STENT PLACEMENT  12/03/2013   LAD 90%-->0% W/ Promus Premier DES 3.0 mm x 16 mm, CFX OK, RCA 40%, EF 70-75%   LEFT ATRIAL APPENDAGE OCCLUSION N/A 08/05/2015   Procedure: LEFT ATRIAL APPENDAGE OCCLUSION;  Surgeon: [name], MD;  Location: [facility] CV LAB;  Service: Cardiovascular;  Laterality: N/A;   LEFT HEART CATHETERIZATION WITH CORONARY ANGIOGRAM Left 12/03/2013   Procedure: LEFT HEART CATHETERIZATION WITH CORONARY ANGIOGRAM;  Surgeon: Leonie Man, MD;  Location: [facility] CATH LAB;  Service: Cardiovascular;  Laterality: Left;   LEFT HEART CATHETERIZATION WITH CORONARY ANGIOGRAM N/A 01/26/2014   Procedure: LEFT HEART CATHETERIZATION WITH CORONARY ANGIOGRAM;  Surgeon: Jettie Booze, MD;  Location: [facility] CATH LAB;  Service: Cardiovascular;  Laterality: N/A;   LEFT HEART CATHETERIZATION WITH CORONARY ANGIOGRAM N/A 08/03/2014   Procedure: LEFT HEART CATHETERIZATION WITH CORONARY ANGIOGRAM;  Surgeon: Burnell Blanks, MD;  Location: [facility] CATH LAB;  Service: Cardiovascular;  Laterality: N/A;   PERCUTANEOUS CORONARY STENT INTERVENTION (PCI-S)  12/03/2013   Procedure: PERCUTANEOUS CORONARY STENT INTERVENTION (PCI-S);  Surgeon: Leonie Man, MD;  Location: [facility] CATH LAB;  Service: Cardiovascular;;   PERMANENT PACEMAKER INSERTION N/A 09/18/2014   Procedure: PERMANENT PACEMAKER INSERTION;  Surgeon: [name], MD;  Location: [facility] CATH LAB;  Service: Cardiovascular;  Laterality: N/A;   POLYPECTOMY  06/24/2021   Procedure: POLYPECTOMY;  Surgeon: Carol Ada, MD;  Location: [facility] ENDOSCOPY;  Service: Endoscopy;;   RADIOFREQUENCY ABLATION  2005   for PSVT   TEE WITHOUT CARDIOVERSION N/A 07/27/2015   Procedure:  TRANSESOPHAGEAL ECHOCARDIOGRAM (TEE);  Surgeon: Lelon Perla, MD;  Location: [facility];  Service: Cardiovascular;  Laterality: N/A;   TEE WITHOUT CARDIOVERSION N/A 09/15/2015   Procedure: TRANSESOPHAGEAL ECHOCARDIOGRAM (TEE);  Surgeon: Thayer Headings, MD;  Location: [facility];  Service: Cardiovascular;  Laterality: N/A;    Home Medications:  Allergies as of 08/09/2021       Reactions   Trazodone And Nefazodone    Nightmares   Lactose Intolerance (gi) Other (See Comments)   UPSET STOMACH        Medication List        Accurate as of August 07, 2021  8:40 PM. If you have any questions, ask your nurse or doctor.          allopurinol 100 MG tablet Commonly known as: ZYLOPRIM TAKE 1 TABLET(100 MG) BY MOUTH DAILY   aspirin EC 325 MG  tablet Take 1 tablet (325 mg total) daily by mouth.   Blood Glucose System Pak Kit Use as directed to monitor FSBS 1x daily. Dx: E11.9.   Clenpiq 10-3.5-12 MG-GM -GM/160ML Soln Generic drug: Sod Picosulfate-Mag Ox-Cit Acd Take 320 mLs by mouth as directed.   diphenoxylate-atropine 2.5-0.025 MG tablet Commonly known as: LOMOTIL Take 1 tablet by mouth every 6 (six) hours as needed for diarrhea or loose stools.   Erleada 60 MG tablet Generic drug: apalutamide TAKE 4 TABLETS (240 MG TOTAL) BY MOUTH DAILY. MAY BE TAKEN WITH OR WITHOUT FOOD. SWALLOW TABLETS WHOLE.   fluticasone 50 MCG/ACT nasal spray Commonly known as: FLONASE Place 2 sprays into both nostrils daily. What changed:  when to take this reasons to take this   furosemide 20 MG tablet Commonly known as: LASIX TAKE 3 TABLETS BY MOUTH DAILY   glipiZIDE 5 MG 24 hr tablet Commonly known as: GLUCOTROL XL TAKE 1 TABLET(5 MG) BY MOUTH DAILY WITH BREAKFAST   HOMEOPATHIC PRODUCTS EX Apply topically.   isosorbide mononitrate 60 MG 24 hr tablet Commonly known as: IMDUR Take 1 tablet (60 mg total) by mouth daily.   levothyroxine 200 MCG tablet Commonly known as:  SYNTHROID TAKE 1 TABLET(200 MCG) BY MOUTH DAILY BEFORE AND BREAKFAST   levothyroxine 25 MCG tablet Commonly known as: SYNTHROID Take 25 mcg by mouth daily.   linaclotide 290 MCG Caps capsule Commonly known as: LINZESS Take 290 mcg by mouth daily before breakfast.   loratadine 10 MG tablet Commonly known as: CLARITIN TAKE 1 TABLET(10 MG) BY MOUTH DAILY AS NEEDED FOR ALLERGIES   magnesium oxide 400 (240 Mg) MG tablet Commonly known as: MAG-OX TAKE 1 TABLET(400 MG) BY MOUTH TWICE DAILY   metFORMIN 500 MG tablet Commonly known as: GLUCOPHAGE Take 1 tablet (500 mg total) by mouth daily with breakfast.   naloxone 4 MG/0.1ML Liqd nasal spray kit Commonly known as: NARCAN Place 4 mg into the nose as needed (accidental overdose).   nitroGLYCERIN 0.4 MG SL tablet Commonly known as: NITROSTAT PLACE 1 TABLET UNDER THE TONGUE EVERY 5 MINUTES AS NEEDED FOR CHEST PAIN. CALL 911 AT THIRD DOSE IN 15 MINUTES   OneTouch Ultra test strip Generic drug: glucose blood USE TO TEST ONCE DAILY   oxyCODONE-acetaminophen 10-325 MG tablet Commonly known as: PERCOCET Take 1 tablet by mouth every 8 (eight) hours as needed for pain.   pantoprazole 40 MG tablet Commonly known as: PROTONIX TAKE 1 TABLET(40 MG) BY MOUTH DAILY   rosuvastatin 40 MG tablet Commonly known as: CRESTOR Take 40 mg by mouth every evening.   silodosin 8 MG Caps capsule Commonly known as: RAPAFLO TAKE 1 CAPSULE(8 MG) BY MOUTH TWICE DAILY   sotalol 80 MG tablet Commonly known as: BETAPACE Take 2 tablets (160 mg) in the AM and 1 tablet (80 mg) in the PM. Please make yearly appt with Dr. Lovena Le for February 2023 for future refills. Thank you 1st attempt   Vitamin B 12 500 MCG Tabs Take 500 mcg by mouth in the morning.   Vitamin D (Ergocalciferol) 1.25 MG (50000 UNIT) Caps capsule Commonly known as: DRISDOL TAKE 1 CAPSULE BY MOUTH EVERY 7 DAYS What changed: when to take this        Allergies:  Allergies  Allergen  Reactions   Trazodone And Nefazodone     Nightmares   Lactose Intolerance (Gi) Other (See Comments)    UPSET STOMACH     Family History  Problem Relation Age of Onset  Hypertension Mother        Cerebrovascular disease   Diabetes Mother    Coronary artery disease Father 61   Diabetes type II Father    Hypertension Father    Heart attack Father    Diabetes Brother    Hypertension Brother    Diabetes Sister    Hypertension Sister    Heart attack Sister 58   Leukemia Sister 76       CML   Breast cancer Maternal Grandmother        dx 18s   Lung cancer Maternal Uncle        dx >50, smoker   Cancer Cousin        mouth cancer, dx 59s, no smoking/chew tobacco hx (maternal 1st cousin)   Ovarian cancer Cousin        dx <50 (maternal 1st cousin)   Stomach cancer Cousin        dx 43s (maternal 1st cousin)   Cancer Cousin        type unknown to pt, dx >50 (paternal 1st cousin)    Social History:  reports that he quit smoking about 8 years ago. His smoking use included cigarettes. He started smoking about 48 years ago. He has a 40.00 pack-year smoking history. He has never used smokeless tobacco. He reports current alcohol use. He reports that he does not use drugs.  ROS: A complete review of systems was performed.  All systems are negative except for pertinent findings as noted.  Physical Exam:  Vital signs in last 24 hours: There were no vitals taken for this visit. Constitutional:  Alert and oriented, No acute distress Cardiovascular: Regular rate  Respiratory: Normal respiratory effort GI: Abdomen is soft, nontender, nondistended, no abdominal masses. No CVAT.  Genitourinary: Normal male phallus, testes are somewhat atrophic, descended bilaterally and non-tender and without masses, scrotum is normal in appearance without lesions or masses, perineum is normal on inspection.  Normal anal sphincter tone.  Prostate smooth, flat, featureless. Lymphatic: No  lymphadenopathy Neurologic: Grossly intact, no focal deficits Psychiatric: Normal mood and affect  I have reviewed prior pt notes  I have reviewed notes from previous physicians  I have reviewed urinalysis results  I have independently reviewed prior imaging  I have reviewed prior PSA/testosterone results.  PSA in January of this year was 0.01, testosterone level castrate    Impression/Assessment:  Metastatic, castrate sensitive prostate cancer, on Firmagon and Erleada, doing well  Plan:  He will continue monthly Firmagon at the cancer center here in [place] as well as the apalutamide  I will just keep a check on him every 4 months or so

## 2021-08-09 ENCOUNTER — Ambulatory Visit (INDEPENDENT_AMBULATORY_CARE_PROVIDER_SITE_OTHER): Payer: Medicare Other | Admitting: Urology

## 2021-08-09 ENCOUNTER — Encounter: Payer: Self-pay | Admitting: Urology

## 2021-08-09 ENCOUNTER — Other Ambulatory Visit: Payer: Self-pay

## 2021-08-09 VITALS — BP 95/57 | HR 69

## 2021-08-09 DIAGNOSIS — Z192 Hormone resistant malignancy status: Secondary | ICD-10-CM | POA: Diagnosis not present

## 2021-08-09 DIAGNOSIS — N401 Enlarged prostate with lower urinary tract symptoms: Secondary | ICD-10-CM

## 2021-08-09 DIAGNOSIS — C61 Malignant neoplasm of prostate: Secondary | ICD-10-CM

## 2021-08-09 DIAGNOSIS — R350 Frequency of micturition: Secondary | ICD-10-CM | POA: Diagnosis not present

## 2021-08-09 LAB — URINALYSIS, ROUTINE W REFLEX MICROSCOPIC
Bilirubin, UA: NEGATIVE
Glucose, UA: NEGATIVE
Leukocytes,UA: NEGATIVE
Nitrite, UA: NEGATIVE
RBC, UA: NEGATIVE
Specific Gravity, UA: >1.03 — ABNORMAL HIGH (ref 1.005–1.030)
Urobilinogen, Ur: 1 mg/dL (ref 0.2–1.0)
pH, UA: 6.5 (ref 5.0–7.5)

## 2021-08-09 LAB — MICROSCOPIC EXAMINATION: Renal Epithel, UA: NONE SEEN /hpf

## 2021-08-09 MED ORDER — SOLIFENACIN SUCCINATE 10 MG PO TABS: 10.0000 mg | ORAL_TABLET | Freq: Every day | ORAL | 11 refills | Status: DC

## 2021-08-10 ENCOUNTER — Ambulatory Visit (INDEPENDENT_AMBULATORY_CARE_PROVIDER_SITE_OTHER): Payer: Medicare Other

## 2021-08-10 DIAGNOSIS — I495 Sick sinus syndrome: Secondary | ICD-10-CM | POA: Diagnosis not present

## 2021-08-10 LAB — CUP PACEART REMOTE DEVICE CHECK
Battery Remaining Longevity: 50 mo
Battery Remaining Percentage: 41 %
Battery Voltage: 2.96 V
Brady Statistic AP VP Percent: 1.4 %
Brady Statistic AP VS Percent: 30 %
Brady Statistic AS VP Percent: 1 %
Brady Statistic AS VS Percent: 68 %
Brady Statistic RA Percent Paced: 31 %
Brady Statistic RV Percent Paced: 1.7 %
Date Time Interrogation Session: 20230308020012
Implantable Lead Implant Date: 20160415
Implantable Lead Implant Date: 20160415
Implantable Lead Location: 753859
Implantable Lead Location: 753860
Implantable Pulse Generator Implant Date: 20160415
Lead Channel Impedance Value: 430 Ohm
Lead Channel Impedance Value: 560 Ohm
Lead Channel Pacing Threshold Amplitude: 0.75 V
Lead Channel Pacing Threshold Amplitude: 0.75 V
Lead Channel Pacing Threshold Pulse Width: 0.5 ms
Lead Channel Pacing Threshold Pulse Width: 0.5 ms
Lead Channel Sensing Intrinsic Amplitude: 12 mV
Lead Channel Sensing Intrinsic Amplitude: 3.5 mV
Lead Channel Setting Pacing Amplitude: 2 V
Lead Channel Setting Pacing Amplitude: 2.5 V
Lead Channel Setting Pacing Pulse Width: 0.5 ms
Lead Channel Setting Sensing Sensitivity: 2 mV
Pulse Gen Model: 2240
Pulse Gen Serial Number: 7756161

## 2021-08-15 ENCOUNTER — Ambulatory Visit (HOSPITAL_COMMUNITY): Payer: Medicare Other

## 2021-08-18 ENCOUNTER — Other Ambulatory Visit (HOSPITAL_COMMUNITY): Payer: Self-pay

## 2021-08-22 ENCOUNTER — Inpatient Hospital Stay (HOSPITAL_COMMUNITY): Payer: Medicare Other | Attending: Hematology

## 2021-08-22 ENCOUNTER — Encounter (HOSPITAL_COMMUNITY): Payer: Self-pay

## 2021-08-22 ENCOUNTER — Other Ambulatory Visit: Payer: Self-pay

## 2021-08-22 VITALS — BP 143/77 | HR 63 | Temp 97.0°F | Resp 18

## 2021-08-22 DIAGNOSIS — C61 Malignant neoplasm of prostate: Secondary | ICD-10-CM | POA: Insufficient documentation

## 2021-08-22 DIAGNOSIS — Z79899 Other long term (current) drug therapy: Secondary | ICD-10-CM | POA: Insufficient documentation

## 2021-08-22 DIAGNOSIS — Z191 Hormone sensitive malignancy status: Secondary | ICD-10-CM

## 2021-08-22 DIAGNOSIS — Z5111 Encounter for antineoplastic chemotherapy: Secondary | ICD-10-CM | POA: Diagnosis not present

## 2021-08-22 MED ORDER — DEGARELIX ACETATE 80 MG ~~LOC~~ SOLR
80.0000 mg | Freq: Once | SUBCUTANEOUS | Status: AC
  Administered 2021-08-22: 80 mg via SUBCUTANEOUS
  Filled 2021-08-22: qty 4

## 2021-08-22 NOTE — Patient Instructions (Signed)
[facility]  Discharge Instructions: ?Thank you for choosing [facility] to provide your oncology and hematology care.  ?If you have a lab appointment with the [facility], please come in thru the Main Entrance and check in at the main information desk. ? ?Wear comfortable clothing and clothing appropriate for easy access to any Portacath or PICC line.  ? ?We strive to give you quality time with your provider. You may need to reschedule your appointment if you arrive late (15 or more minutes).  Arriving late affects you and other patients whose appointments are after yours.  Also, if you miss three or more appointments without notifying the office, you may be dismissed from the clinic at the provider?s discretion.    ?  ?For prescription refill requests, have your pharmacy contact our office and allow 72 hours for refills to be completed.   ? ?Today you received the following : Firmagon injection.     ?  ?To help prevent nausea and vomiting after your treatment, we encourage you to take your nausea medication as directed. ? ?BELOW ARE SYMPTOMS THAT SHOULD BE REPORTED IMMEDIATELY: ?*FEVER GREATER THAN 100.4 F (38 ?C) OR HIGHER ?*CHILLS OR SWEATING ?*NAUSEA AND VOMITING THAT IS NOT CONTROLLED WITH YOUR NAUSEA MEDICATION ?*UNUSUAL SHORTNESS OF BREATH ?*UNUSUAL BRUISING OR BLEEDING ?*URINARY PROBLEMS (pain or burning when urinating, or frequent urination) ?*BOWEL PROBLEMS (unusual diarrhea, constipation, pain near the anus) ?TENDERNESS IN MOUTH AND THROAT WITH OR WITHOUT PRESENCE OF ULCERS (sore throat, sores in mouth, or a toothache) ?UNUSUAL RASH, SWELLING OR PAIN  ?UNUSUAL VAGINAL DISCHARGE OR ITCHING  ? ?Items with * indicate a potential emergency and should be followed up as soon as possible or go to the Emergency Department if any problems should occur. ? ?Please show the CHEMOTHERAPY ALERT CARD or IMMUNOTHERAPY ALERT CARD at check-in to the Emergency Department and triage  nurse. ? ?Should you have questions after your visit or need to cancel or reschedule your appointment, please contact [facility] [phone]  and follow the prompts.  Office hours are 8:00 a.m. to 4:30 p.m. Monday - Friday. Please note that voicemails left after 4:00 p.m. may not be returned until the following business day.  We are closed weekends and major holidays. You have access to a nurse at all times for urgent questions. Please call the main number to the clinic [phone] and follow the prompts. ? ?For any non-urgent questions, you may also contact your provider using MyChart. We now offer e-Visits for anyone 57 and older to request care online for non-urgent symptoms. For details visit [email or web address]. ?  ?Also download the MyChart app! Go to the app store, search "MyChart", open the app, select [facility], and log in with your MyChart username and password. ? ?Due to Covid, a mask is required upon entering the hospital/clinic. If you do not have a mask, one will be given to you upon arrival. For doctor visits, patients may have 1 support person aged 49 or older with them. For treatment visits, patients cannot have anyone with them due to current Covid guidelines and our immunocompromised population.  ?

## 2021-08-22 NOTE — Progress Notes (Signed)
Peter Dunlap presents today for injection per the provider's orders.  Firmagon 80 mg  administration without incident; injection site WNL; see MAR for injection details.  Patient tolerated procedure well and without incident.  No questions or complaints noted at this time. Treatment given today per MD orders. Vital signs stable. No complaints at this time. Discharged from clinic ambulatory in stable condition. Alert and oriented x 3. F/U with [facility] as scheduled.   ?

## 2021-08-23 ENCOUNTER — Other Ambulatory Visit (HOSPITAL_COMMUNITY): Payer: Self-pay

## 2021-08-23 NOTE — Progress Notes (Signed)
Remote pacemaker transmission.   

## 2021-08-30 ENCOUNTER — Other Ambulatory Visit (HOSPITAL_COMMUNITY): Payer: Self-pay

## 2021-09-05 ENCOUNTER — Other Ambulatory Visit: Payer: Self-pay

## 2021-09-05 ENCOUNTER — Other Ambulatory Visit: Payer: Self-pay | Admitting: Internal Medicine

## 2021-09-05 ENCOUNTER — Other Ambulatory Visit: Payer: Self-pay | Admitting: Family Medicine

## 2021-09-05 ENCOUNTER — Other Ambulatory Visit (HOSPITAL_COMMUNITY): Payer: Medicare Other

## 2021-09-05 DIAGNOSIS — R634 Abnormal weight loss: Secondary | ICD-10-CM

## 2021-09-05 DIAGNOSIS — M10071 Idiopathic gout, right ankle and foot: Secondary | ICD-10-CM

## 2021-09-05 DIAGNOSIS — I209 Angina pectoris, unspecified: Secondary | ICD-10-CM

## 2021-09-05 DIAGNOSIS — R0600 Dyspnea, unspecified: Secondary | ICD-10-CM

## 2021-09-05 MED ORDER — MAGNESIUM OXIDE -MG SUPPLEMENT 400 (240 MG) MG PO TABS: ORAL_TABLET | ORAL | 0 refills | Status: DC

## 2021-09-05 MED ORDER — SOTALOL HCL 80 MG PO TABS: ORAL_TABLET | ORAL | 0 refills | Status: DC

## 2021-09-05 MED ORDER — ISOSORBIDE MONONITRATE ER 60 MG PO TB24: 60.0000 mg | ORAL_TABLET | Freq: Every day | ORAL | 0 refills | Status: DC

## 2021-09-05 NOTE — Addendum Note (Signed)
Addended by: Carter Kitten D on: 09/05/2021 11:11 AM ? ? Modules accepted: Orders ? ?

## 2021-09-11 ENCOUNTER — Emergency Department (HOSPITAL_COMMUNITY): Payer: Medicare Other

## 2021-09-11 ENCOUNTER — Other Ambulatory Visit: Payer: Self-pay

## 2021-09-11 ENCOUNTER — Inpatient Hospital Stay (HOSPITAL_COMMUNITY)
Admission: EM | Admit: 2021-09-11 | Discharge: 2021-09-14 | DRG: 287 | Disposition: A | Discharge status: Home / Self Care | Payer: Medicare Other | Attending: Internal Medicine | Admitting: Internal Medicine

## 2021-09-11 ENCOUNTER — Encounter (HOSPITAL_COMMUNITY): Payer: Self-pay | Admitting: Emergency Medicine

## 2021-09-11 DIAGNOSIS — I200 Unstable angina: Secondary | ICD-10-CM | POA: Diagnosis not present

## 2021-09-11 DIAGNOSIS — Z7901 Long term (current) use of anticoagulants: Secondary | ICD-10-CM

## 2021-09-11 DIAGNOSIS — Z7984 Long term (current) use of oral hypoglycemic drugs: Secondary | ICD-10-CM

## 2021-09-11 DIAGNOSIS — I5030 Unspecified diastolic (congestive) heart failure: Secondary | ICD-10-CM | POA: Diagnosis present

## 2021-09-11 DIAGNOSIS — Z8249 Family history of ischemic heart disease and other diseases of the circulatory system: Secondary | ICD-10-CM

## 2021-09-11 DIAGNOSIS — Z6841 Body Mass Index (BMI) 40.0 and over, adult: Secondary | ICD-10-CM

## 2021-09-11 DIAGNOSIS — Z79899 Other long term (current) drug therapy: Secondary | ICD-10-CM

## 2021-09-11 DIAGNOSIS — I350 Nonrheumatic aortic (valve) stenosis: Secondary | ICD-10-CM | POA: Diagnosis present

## 2021-09-11 DIAGNOSIS — Z955 Presence of coronary angioplasty implant and graft: Secondary | ICD-10-CM

## 2021-09-11 DIAGNOSIS — Z8041 Family history of malignant neoplasm of ovary: Secondary | ICD-10-CM

## 2021-09-11 DIAGNOSIS — Z79890 Hormone replacement therapy (postmenopausal): Secondary | ICD-10-CM

## 2021-09-11 DIAGNOSIS — Z7982 Long term (current) use of aspirin: Secondary | ICD-10-CM

## 2021-09-11 DIAGNOSIS — E739 Lactose intolerance, unspecified: Secondary | ICD-10-CM | POA: Diagnosis present

## 2021-09-11 DIAGNOSIS — Z806 Family history of leukemia: Secondary | ICD-10-CM

## 2021-09-11 DIAGNOSIS — Z803 Family history of malignant neoplasm of breast: Secondary | ICD-10-CM

## 2021-09-11 DIAGNOSIS — I422 Other hypertrophic cardiomyopathy: Secondary | ICD-10-CM | POA: Diagnosis present

## 2021-09-11 DIAGNOSIS — R079 Chest pain, unspecified: Secondary | ICD-10-CM | POA: Diagnosis present

## 2021-09-11 DIAGNOSIS — I5032 Chronic diastolic (congestive) heart failure: Secondary | ICD-10-CM | POA: Diagnosis present

## 2021-09-11 DIAGNOSIS — Z801 Family history of malignant neoplasm of trachea, bronchus and lung: Secondary | ICD-10-CM

## 2021-09-11 DIAGNOSIS — E782 Mixed hyperlipidemia: Secondary | ICD-10-CM | POA: Diagnosis present

## 2021-09-11 DIAGNOSIS — I471 Supraventricular tachycardia: Secondary | ICD-10-CM | POA: Diagnosis present

## 2021-09-11 DIAGNOSIS — Z800 Family history of malignant neoplasm of digestive organs: Secondary | ICD-10-CM

## 2021-09-11 DIAGNOSIS — I25110 Atherosclerotic heart disease of native coronary artery with unstable angina pectoris: Secondary | ICD-10-CM | POA: Diagnosis not present

## 2021-09-11 DIAGNOSIS — I495 Sick sinus syndrome: Secondary | ICD-10-CM | POA: Diagnosis present

## 2021-09-11 DIAGNOSIS — I480 Paroxysmal atrial fibrillation: Secondary | ICD-10-CM | POA: Diagnosis present

## 2021-09-11 DIAGNOSIS — Z808 Family history of malignant neoplasm of other organs or systems: Secondary | ICD-10-CM

## 2021-09-11 DIAGNOSIS — G4730 Sleep apnea, unspecified: Secondary | ICD-10-CM | POA: Diagnosis present

## 2021-09-11 DIAGNOSIS — K219 Gastro-esophageal reflux disease without esophagitis: Secondary | ICD-10-CM | POA: Diagnosis present

## 2021-09-11 DIAGNOSIS — I2510 Atherosclerotic heart disease of native coronary artery without angina pectoris: Secondary | ICD-10-CM | POA: Diagnosis present

## 2021-09-11 DIAGNOSIS — Z87891 Personal history of nicotine dependence: Secondary | ICD-10-CM

## 2021-09-11 DIAGNOSIS — E669 Obesity, unspecified: Secondary | ICD-10-CM | POA: Diagnosis present

## 2021-09-11 DIAGNOSIS — E119 Type 2 diabetes mellitus without complications: Secondary | ICD-10-CM | POA: Diagnosis present

## 2021-09-11 DIAGNOSIS — Z8546 Personal history of malignant neoplasm of prostate: Secondary | ICD-10-CM

## 2021-09-11 DIAGNOSIS — Z833 Family history of diabetes mellitus: Secondary | ICD-10-CM

## 2021-09-11 DIAGNOSIS — I4891 Unspecified atrial fibrillation: Secondary | ICD-10-CM | POA: Diagnosis not present

## 2021-09-11 DIAGNOSIS — Z950 Presence of cardiac pacemaker: Secondary | ICD-10-CM

## 2021-09-11 DIAGNOSIS — D649 Anemia, unspecified: Secondary | ICD-10-CM | POA: Diagnosis present

## 2021-09-11 DIAGNOSIS — Z87820 Personal history of traumatic brain injury: Secondary | ICD-10-CM

## 2021-09-11 DIAGNOSIS — I110 Hypertensive heart disease with heart failure: Secondary | ICD-10-CM | POA: Diagnosis present

## 2021-09-11 DIAGNOSIS — I252 Old myocardial infarction: Secondary | ICD-10-CM

## 2021-09-11 DIAGNOSIS — N179 Acute kidney failure, unspecified: Secondary | ICD-10-CM | POA: Diagnosis present

## 2021-09-11 DIAGNOSIS — I493 Ventricular premature depolarization: Secondary | ICD-10-CM

## 2021-09-11 DIAGNOSIS — N400 Enlarged prostate without lower urinary tract symptoms: Secondary | ICD-10-CM | POA: Diagnosis present

## 2021-09-11 LAB — BASIC METABOLIC PANEL
Anion gap: 9 (ref 5–15)
BUN: 13 mg/dL (ref 8–23)
CO2: 20 mmol/L — ABNORMAL LOW (ref 22–32)
Calcium: 9 mg/dL (ref 8.9–10.3)
Chloride: 105 mmol/L (ref 98–111)
Creatinine, Ser: 1.39 mg/dL — ABNORMAL HIGH (ref 0.61–1.24)
GFR, Estimated: 56 mL/min — ABNORMAL LOW (ref >60)
Glucose, Bld: 97 mg/dL (ref 70–99)
Potassium: 3.9 mmol/L (ref 3.5–5.1)
Sodium: 134 mmol/L — ABNORMAL LOW (ref 135–145)

## 2021-09-11 LAB — CBC
HCT: 34 % — ABNORMAL LOW (ref 39.0–52.0)
HCT: 31.7 % — ABNORMAL LOW (ref 39.0–52.0)
Hemoglobin: 11 g/dL — ABNORMAL LOW (ref 13.0–17.0)
Hemoglobin: 10.1 g/dL — ABNORMAL LOW (ref 13.0–17.0)
MCH: 30.5 pg (ref 26.0–34.0)
MCH: 30.4 pg (ref 26.0–34.0)
MCHC: 32.4 g/dL (ref 30.0–36.0)
MCHC: 31.9 g/dL (ref 30.0–36.0)
MCV: 94.2 fL (ref 80.0–100.0)
MCV: 95.5 fL (ref 80.0–100.0)
Platelets: 136 10*3/uL — ABNORMAL LOW (ref 150–400)
Platelets: 102 10*3/uL — ABNORMAL LOW (ref 150–400)
RBC: 3.61 MIL/uL — ABNORMAL LOW (ref 4.22–5.81)
RBC: 3.32 MIL/uL — ABNORMAL LOW (ref 4.22–5.81)
RDW: 14.9 % (ref 11.5–15.5)
RDW: 15 % (ref 11.5–15.5)
WBC: 6.8 10*3/uL (ref 4.0–10.5)
WBC: 5.8 10*3/uL (ref 4.0–10.5)
nRBC: 0 % (ref 0.0–0.2)
nRBC: 0 % (ref 0.0–0.2)

## 2021-09-11 LAB — TROPONIN I (HIGH SENSITIVITY)
Troponin I (High Sensitivity): 18 ng/L — ABNORMAL HIGH (ref <18)
Troponin I (High Sensitivity): 20 ng/L — ABNORMAL HIGH (ref <18)
Troponin I (High Sensitivity): 20 ng/L — ABNORMAL HIGH (ref <18)
Troponin I (High Sensitivity): 19 ng/L — ABNORMAL HIGH (ref <18)

## 2021-09-11 LAB — CREATININE, SERUM
Creatinine, Ser: 1.38 mg/dL — ABNORMAL HIGH (ref 0.61–1.24)
GFR, Estimated: 57 mL/min — ABNORMAL LOW (ref >60)

## 2021-09-11 LAB — GLUCOSE, CAPILLARY
Glucose-Capillary: 108 mg/dL — ABNORMAL HIGH (ref 70–99)
Glucose-Capillary: 68 mg/dL — ABNORMAL LOW (ref 70–99)

## 2021-09-11 MED ORDER — HEPARIN SODIUM (PORCINE) 5000 UNIT/ML IJ SOLN: 5000.0000 [IU] | Freq: Three times a day (TID) | INTRAMUSCULAR | Status: DC

## 2021-09-11 MED ORDER — LEVOTHYROXINE SODIUM 100 MCG PO TABS
200.0000 ug | ORAL_TABLET | Freq: Every day | ORAL | Status: DC
  Administered 2021-09-12 – 2021-09-14 (×3): 200 ug via ORAL
  Filled 2021-09-11 (×3): qty 2

## 2021-09-11 MED ORDER — ISOSORBIDE MONONITRATE ER 30 MG PO TB24
60.0000 mg | ORAL_TABLET | Freq: Every day | ORAL | Status: DC
  Administered 2021-09-11: 60 mg via ORAL
  Filled 2021-09-11: qty 2

## 2021-09-11 MED ORDER — OXYCODONE-ACETAMINOPHEN 10-325 MG PO TABS: 1.0000 | ORAL_TABLET | Freq: Three times a day (TID) | ORAL | Status: DC | PRN

## 2021-09-11 MED ORDER — IOHEXOL 350 MG/ML SOLN
100.0000 mL | Freq: Once | INTRAVENOUS | Status: AC | PRN
  Administered 2021-09-11: 100 mL via INTRAVENOUS

## 2021-09-11 MED ORDER — ISOSORBIDE MONONITRATE ER 60 MG PO TB24
60.0000 mg | ORAL_TABLET | Freq: Every day | ORAL | Status: DC
  Administered 2021-09-12 – 2021-09-14 (×3): 60 mg via ORAL
  Filled 2021-09-11 (×3): qty 1

## 2021-09-11 MED ORDER — SODIUM CHLORIDE 0.9 % IV BOLUS
500.0000 mL | Freq: Once | INTRAVENOUS | Status: AC
  Administered 2021-09-11: 500 mL via INTRAVENOUS

## 2021-09-11 MED ORDER — FUROSEMIDE 40 MG PO TABS
60.0000 mg | ORAL_TABLET | Freq: Every day | ORAL | Status: DC
  Administered 2021-09-12 – 2021-09-14 (×3): 60 mg via ORAL
  Filled 2021-09-11 (×3): qty 1

## 2021-09-11 MED ORDER — NITROGLYCERIN 0.4 MG SL SUBL
0.4000 mg | SUBLINGUAL_TABLET | Freq: Once | SUBLINGUAL | Status: DC
  Filled 2021-09-11: qty 1

## 2021-09-11 MED ORDER — FENTANYL CITRATE PF 50 MCG/ML IJ SOSY
50.0000 ug | PREFILLED_SYRINGE | Freq: Once | INTRAMUSCULAR | Status: AC
  Administered 2021-09-11: 50 ug via INTRAVENOUS
  Filled 2021-09-11 (×2): qty 1

## 2021-09-11 MED ORDER — ALLOPURINOL 100 MG PO TABS: 100.0000 mg | ORAL_TABLET | Freq: Every day | ORAL | Status: DC

## 2021-09-11 MED ORDER — MAGNESIUM OXIDE -MG SUPPLEMENT 400 (240 MG) MG PO TABS
400.0000 mg | ORAL_TABLET | Freq: Two times a day (BID) | ORAL | Status: DC
  Administered 2021-09-11 – 2021-09-14 (×6): 400 mg via ORAL
  Filled 2021-09-11 (×6): qty 1

## 2021-09-11 MED ORDER — OXYCODONE-ACETAMINOPHEN 5-325 MG PO TABS
1.0000 | ORAL_TABLET | Freq: Three times a day (TID) | ORAL | Status: DC | PRN
  Administered 2021-09-11 – 2021-09-13 (×4): 1 via ORAL
  Filled 2021-09-11 (×4): qty 1

## 2021-09-11 MED ORDER — PANTOPRAZOLE SODIUM 40 MG PO TBEC
40.0000 mg | DELAYED_RELEASE_TABLET | Freq: Every day | ORAL | Status: DC
  Administered 2021-09-12 – 2021-09-14 (×3): 40 mg via ORAL
  Filled 2021-09-11 (×3): qty 1

## 2021-09-11 MED ORDER — DARIFENACIN HYDROBROMIDE ER 15 MG PO TB24
15.0000 mg | ORAL_TABLET | Freq: Every day | ORAL | Status: DC
  Administered 2021-09-12 – 2021-09-14 (×3): 15 mg via ORAL
  Filled 2021-09-11 (×3): qty 1

## 2021-09-11 MED ORDER — OXYCODONE HCL 5 MG PO TABS
5.0000 mg | ORAL_TABLET | Freq: Three times a day (TID) | ORAL | Status: DC | PRN
  Administered 2021-09-11 – 2021-09-14 (×6): 5 mg via ORAL
  Filled 2021-09-11 (×6): qty 1

## 2021-09-11 MED ORDER — ASPIRIN EC 325 MG PO TBEC
325.0000 mg | DELAYED_RELEASE_TABLET | Freq: Every day | ORAL | Status: DC
  Administered 2021-09-11 – 2021-09-14 (×3): 325 mg via ORAL
  Filled 2021-09-11 (×4): qty 1

## 2021-09-11 MED ORDER — ACETAMINOPHEN 325 MG PO TABS: 650.0000 mg | ORAL_TABLET | ORAL | Status: DC | PRN

## 2021-09-11 MED ORDER — SOTALOL HCL 80 MG PO TABS
160.0000 mg | ORAL_TABLET | Freq: Every morning | ORAL | Status: DC
  Administered 2021-09-12 – 2021-09-13 (×2): 160 mg via ORAL
  Filled 2021-09-11 (×3): qty 2

## 2021-09-11 MED ORDER — SOTALOL HCL 80 MG PO TABS
80.0000 mg | ORAL_TABLET | Freq: Every day | ORAL | Status: DC
  Administered 2021-09-11 – 2021-09-13 (×3): 80 mg via ORAL
  Filled 2021-09-11 (×3): qty 1

## 2021-09-11 MED ORDER — APALUTAMIDE 60 MG PO TABS
240.0000 mg | ORAL_TABLET | Freq: Every day | ORAL | Status: DC
  Administered 2021-09-12 – 2021-09-14 (×3): 240 mg via ORAL
  Filled 2021-09-11 (×3): qty 4

## 2021-09-11 MED ORDER — ALLOPURINOL 100 MG PO TABS
100.0000 mg | ORAL_TABLET | Freq: Every day | ORAL | Status: DC
  Administered 2021-09-12 – 2021-09-14 (×3): 100 mg via ORAL
  Filled 2021-09-11 (×3): qty 1

## 2021-09-11 MED ORDER — ROSUVASTATIN CALCIUM 20 MG PO TABS
40.0000 mg | ORAL_TABLET | Freq: Every evening | ORAL | Status: DC
  Administered 2021-09-11 – 2021-09-14 (×4): 40 mg via ORAL
  Filled 2021-09-11 (×4): qty 2

## 2021-09-11 MED ORDER — NITROGLYCERIN 0.4 MG SL SUBL: 0.4000 mg | SUBLINGUAL_TABLET | SUBLINGUAL | Status: DC | PRN

## 2021-09-11 MED ORDER — ONDANSETRON HCL 4 MG/2ML IJ SOLN
4.0000 mg | Freq: Four times a day (QID) | INTRAMUSCULAR | Status: DC | PRN
Start: 2021-09-11 — End: 2021-09-14

## 2021-09-11 MED ORDER — HEPARIN SODIUM (PORCINE) 5000 UNIT/ML IJ SOLN
5000.0000 [IU] | Freq: Three times a day (TID) | INTRAMUSCULAR | Status: DC
Start: 2021-09-11 — End: 2021-09-14
  Administered 2021-09-11 – 2021-09-14 (×6): 5000 [IU] via SUBCUTANEOUS
  Filled 2021-09-11 (×5): qty 1

## 2021-09-11 NOTE — ED Provider Notes (Signed)
Signout from Dr. Pearline Cables.  66 year old male with multiple cardiac risk factors here with ongoing chest pain.  Initial troponin mildly elevated.  He is pending cardiology consult.  Disposition per cardiology recommendations. ?Physical Exam  ?BP (!) 168/75   Pulse 64   Temp 97.6 ?F (36.4 ?C)   Resp 16   SpO2 100%  ? ?Physical Exam ? ?Procedures  ?Procedures ? ?ED Course / MDM  ?  ?Medical Decision Making ?Amount and/or Complexity of Data Reviewed ?Labs: ordered. ?Radiology: ordered. ? ?Risk ?Prescription drug management. ?Decision regarding hospitalization. ? ? ?I have not heard back from cardiology although it looks like they have accepted the patient onto their service. ? ? ? ? ?  ?Hayden Rasmussen, MD ?09/12/21 [phone] ? ?

## 2021-09-11 NOTE — ED Provider Notes (Signed)
?[place] ?Provider Note ? ? ?CSN: [id number] ?Arrival date & time: 09/11/21  1248 ? ?  ? ?History ? ?Chief Complaint  ?Patient presents with  ? Chest Pain  ? ? ?Peter Dunlap is a 66 y.o. male. ? ?Patient is a 66 year old male with past medical history of hypertension, hyperlipidemia, obesity, cardial infarction with cardiac stents presenting for complaints of chest pain.  Patient with substernal chest pain that began excellently 3 hours prior to arrival.  States he took a home Vicodin he had for shoulder pain minimal relief.  Denies any nausea, diaphoresis, radiation to the neck/jaw/upper extremities.  No shortness of breath or recent upper respiratory infection.  Denies fevers, chills, coughing. or coughing. ? ?The history is provided by the patient. No language interpreter was used.  ?Chest Pain ?Associated symptoms: no abdominal pain, no back pain, no cough, no fever, no palpitations, no shortness of breath and no vomiting   ? ?  ? ?Home Medications ?Prior to Admission medications   ?Medication Sig Start Date End Date Taking? Authorizing Provider  ?allopurinol (ZYLOPRIM) 100 MG tablet TAKE 1 TABLET(100 MG) BY MOUTH DAILY 09/07/20   Susy Frizzle, MD  ?apalutamide (ERLEADA) 60 MG tablet TAKE 4 TABLETS (240 MG TOTAL) BY MOUTH DAILY. MAY BE TAKEN WITH OR WITHOUT FOOD. SWALLOW TABLETS WHOLE. 06/29/21 06/29/22  Derek Jack, MD  ?aspirin EC 325 MG tablet Take 1 tablet (325 mg total) daily by mouth. 04/13/17   Richardson Dopp T, PA-C  ?Blood Glucose Monitoring Suppl (BLOOD GLUCOSE SYSTEM PAK) KIT Use as directed to monitor FSBS 1x daily. Dx: E11.9. 03/04/19   [name], MD  ?CLENPIQ 10-3.5-12 MG-GM -GM/160ML SOLN Take 320 mLs by mouth as directed. 05/12/21   [provider]  ?Cyanocobalamin (VITAMIN B 12) 500 MCG TABS Take 500 mcg by mouth in the morning.    [provider]  ?diclofenac Sodium (VOLTAREN) 1 % GEL SMARTSIG:2-4 Gram(s) Topical 1-4  Times Daily PRN 08/18/21   [provider]  ?diphenoxylate-atropine (LOMOTIL) 2.5-0.025 MG tablet Take 1 tablet by mouth every 6 (six) hours as needed for diarrhea or loose stools. 12/17/20   [provider]  ?ergocalciferol (VITAMIN D2) 1.25 MG (50000 UT) capsule Take by mouth.    [provider]  ?fluticasone (FLONASE) 50 MCG/ACT nasal spray Place 2 sprays into both nostrils daily. ?Patient taking differently: Place 2 sprays into both nostrils daily as needed for allergies. 06/02/20   Eulogio Bear, NP  ?furosemide (LASIX) 20 MG tablet TAKE 3 TABLETS BY MOUTH DAILY 06/24/19   [name], MD  ?glipiZIDE (GLUCOTROL XL) 5 MG 24 hr tablet TAKE 1 TABLET(5 MG) BY MOUTH DAILY WITH BREAKFAST 07/19/20   [place], [name], MD  ?HOMEOPATHIC PRODUCTS EX Apply topically.    [provider]  ?isosorbide mononitrate (IMDUR) 60 MG 24 hr tablet Take 1 tablet (60 mg total) by mouth daily. 09/05/21   [name], MD  ?levothyroxine (SYNTHROID) 200 MCG tablet TAKE 1 TABLET(200 MCG) BY MOUTH DAILY BEFORE AND BREAKFAST 09/07/20   Brita Romp, NP  ?levothyroxine (SYNTHROID) 25 MCG tablet Take 25 mcg by mouth daily. 02/24/21   [provider]  ?linaclotide (LINZESS) 290 MCG CAPS capsule Take 290 mcg by mouth daily before breakfast.    [provider]  ?loratadine (CLARITIN) 10 MG tablet TAKE 1 TABLET(10 MG) BY MOUTH DAILY AS NEEDED FOR ALLERGIES 04/19/20   [name], MD  ?magnesium oxide (MAG-OX)  400 (240 Mg) MG tablet TAKE 1 TABLET(400 MG) BY MOUTH TWICE DAILY 09/05/21   [name], MD  ?metFORMIN (GLUCOPHAGE) 500 MG tablet Take 1 tablet (500 mg total) by mouth daily with breakfast. 06/13/21   Brita Romp, NP  ?naloxone [facility]) nasal spray 4 mg/0.1 mL Place 4 mg into the nose as needed (accidental overdose). 01/28/21   [provider]  ?nitroGLYCERIN (NITROSTAT) 0.4 MG SL tablet PLACE 1 TABLET UNDER THE TONGUE EVERY 5 MINUTES AS NEEDED FOR CHEST  PAIN. CALL 911 AT THIRD DOSE IN 15 MINUTES 06/23/19   Burtis Junes, NP  ?ONETOUCH ULTRA test strip USE TO TEST ONCE DAILY 05/12/20   [name], MD  ?oxyCODONE-acetaminophen (PERCOCET) 10-325 MG tablet Take 1 tablet by mouth every 8 (eight) hours as needed for pain. 08/13/20   [name], MD  ?pantoprazole (PROTONIX) 40 MG tablet TAKE 1 TABLET(40 MG) BY MOUTH DAILY 07/19/20   [name], MD  ?rosuvastatin (CRESTOR) 40 MG tablet Take 40 mg by mouth every evening. 04/02/21   [provider]  ?silodosin (RAPAFLO) 8 MG CAPS capsule TAKE 1 CAPSULE(8 MG) BY MOUTH TWICE DAILY 05/17/21   Franchot Gallo, MD  ?solifenacin (VESICARE) 10 MG tablet Take 1 tablet (10 mg total) by mouth daily. 08/09/21   Franchot Gallo, MD  ?sotalol (BETAPACE) 80 MG tablet Take 2 tablets (160 mg) in the AM and 1 tablet (80 mg) in the PM. 09/05/21   [name], MD  ?vitamin B-12 (CYANOCOBALAMIN) 500 MCG tablet Take by mouth.    [provider]  ?Vitamin D, Ergocalciferol, (DRISDOL) 1.25 MG (50000 UNIT) CAPS capsule TAKE 1 CAPSULE BY MOUTH EVERY 7 DAYS ?Patient taking differently: Take 50,000 Units by mouth every Thursday. 06/25/20   [name], MD  ?   ? ?Allergies    ?Trazodone and nefazodone and Lactose intolerance (gi)   ? ?Review of Systems   ?Review of Systems  ?Constitutional:  Negative for chills and fever.  ?HENT:  Negative for ear pain and sore throat.   ?Eyes:  Negative for pain and visual disturbance.  ?Respiratory:  Negative for cough and shortness of breath.   ?Cardiovascular:  Positive for chest pain. Negative for palpitations.  ?Gastrointestinal:  Negative for abdominal pain and vomiting.  ?Genitourinary:  Negative for dysuria and hematuria.  ?Musculoskeletal:  Negative for arthralgias and back pain.  ?Skin:  Negative for color change and rash.  ?Neurological:  Negative for seizures and syncope.  ?All other systems reviewed and are negative. ? ?Physical Exam ?Updated Vital  Signs ?BP 117/73   Pulse 63   Temp 97.6 ?F (36.4 ?C)   Resp 17   SpO2 99%  ?Physical Exam ?Vitals and nursing note reviewed.  ?Constitutional:   ?   General: He is not in acute distress. ?   Appearance: He is well-developed.  ?HENT:  ?   Head: Normocephalic and atraumatic.  ?Eyes:  ?   Conjunctiva/sclera: Conjunctivae normal.  ?Cardiovascular:  ?   Rate and Rhythm: Normal rate and regular rhythm.  ?   Heart sounds: No murmur heard. ?Pulmonary:  ?   Effort: Pulmonary effort is normal. No respiratory distress.  ?   Breath sounds: Normal breath sounds.  ?Abdominal:  ?   Palpations: Abdomen is soft.  ?   Tenderness: There is no abdominal tenderness.  ?Musculoskeletal:     ?   General: No swelling.  ?   Cervical back: Neck supple.  ?Skin: ?  General: Skin is warm and dry.  ?   Capillary Refill: Capillary refill takes less than 2 seconds.  ?Neurological:  ?   Mental Status: He is alert.  ?Psychiatric:     ?   Mood and Affect: Mood normal.  ? ? ?ED Results / Procedures / Treatments   ?Labs ?(all labs ordered are listed, but only abnormal results are displayed) ?Labs Reviewed  ?BASIC METABOLIC PANEL - Abnormal; Notable for the following components:  ?    Result Value  ? Sodium 134 (*)   ? CO2 20 (*)   ? Creatinine, Ser 1.39 (*)   ? GFR, Estimated 56 (*)   ? All other components within normal limits  ?CBC - Abnormal; Notable for the following components:  ? RBC 3.61 (*)   ? Hemoglobin 11.0 (*)   ? HCT 34.0 (*)   ? Platelets 136 (*)   ? All other components within normal limits  ?TROPONIN I (HIGH SENSITIVITY) - Abnormal; Notable for the following components:  ? Troponin I (High Sensitivity) 18 (*)   ? All other components within normal limits  ? ? ?EKG ?EKG Interpretation ? ?Date/Time:  Sunday September 11 2021 12:56:50 EDT ?Ventricular Rate:  72 ?PR Interval:  194 ?QRS Duration: 94 ?QT Interval:  454 ?QTC Calculation: 497 ?R Axis:   29 ?Text Interpretation: Normal sinus rhythm Possible Anterior infarct , age undetermined ST  & T wave abnormality, consider lateral ischemia Abnormal ECG When compared with ECG of 06-Aug-2015 06:55, PREVIOUS ECG IS PRESENT Confirmed by Campbell Stall ([id number]) on 0/12/2255 1:56:32 PM ? ?Radiology ?No results f

## 2021-09-11 NOTE — ED Notes (Signed)
Cardiology MD informed this RN pt can eat. Pt given [place] sandwich and water.  ?

## 2021-09-11 NOTE — ED Notes (Signed)
Pt ambulated to bathroom with assistance.

## 2021-09-11 NOTE — Consult Note (Deleted)
?Cardiology Consultation:  ? ?Patient ID: Peter Dunlap ?MRN: [id number]; DOB: [birth year] ? ?Admit date: 09/11/2021 ?Date of Consult: 09/11/2021 ? ?PCP:  Dulce Sellar, MD ?  ?[facility] Providers ?Cardiologist:  [name], MD      ? ? ?Patient Profile:  ? ?Peter Dunlap is a 66 y.o. male with a hx of PSVT s/p catheter ablation, hypertensive heart disease with diastolic heart failure, symptomatic bradycardia s/p pacemaker, paroxysmal atrial fibrillation, coronary artery disease s/p prior PCI to the LAD and OM.  He has a hx of falls and prior subdural hematoma s/p evacuations/p Watchman on ASA 325 mg daily, hx of prostate CA (PET 2021-mets within the pelvis) on androgen deprivation therapy . who is being seen 09/11/2021 for the evaluation of chest pain at the request of Dr. Melina Copa. ? ?History of Present Illness:  ? ?Mr Arizpe presents today with progressive chest pressure. He notes some chest pressure and SOB with activity . Today, he was taking a shower and preparing to leave the house when he felt left sided substernal chest pain that persisted. His EKG is notable for frequent PVCs ,compensatory pause and V pacing. Sinus rhythm. He notes some SOB with activity and notes high heart racing. He denies orthopnea, PND or LE edema. His family is by the bedside. ? ?Blood pressures are normal. He's on room air.  Troponin 18 and 20.  He has a mild AKI. Chronic anemai hgb 11-12. He had a CT PE in the ED. Was negative for PE.  Did not aortic valve calcification. Also has cirrhotic appearing liver. ? ?He sees Dr. Lovena Le with normal PPM functioning and continue on sotalol. He saw Richardson Dopp in 2018 for follow up. He has a notable hx of subdural hematoma after a fall. He was on coumadin for afib. He therefore was recommended a Watchman device and is s/p Watchman on 08/05/2015. He had FU TEE that showed normal watchman positioning. No leak. ? ? ?Past Medical History:  ?Diagnosis Date  ? Arteriosclerotic cardiovascular  disease (ASCVD) 2005  ? catheterization in 10/2010:50% mid LAD, diffuse distal disease, circumflex irregularities, large dominant RCA with a 50% ostial, 70% distal, 60% posterolateral and 70% PDA; normal EF  ? Arthritis   ? Benign prostatic hypertrophy   ? Bilateral carpal tunnel syndrome 07/03/2018  ? Cerebrovascular disease 2010  ? R. carotid endarterectomy; Duplex in 10/2010-widely patent ICAs, subtotal left vertebral-not thought to be contributing to symptoms  ? Cervical spine disease   ? CT in 2012-advanced degeneration and spondylosis with moderate spinal stenosis at C3-C6  ? CHF (congestive heart failure) ([place])   ? Depression   ? Diabetes mellitus without complication ([place])   ? Erectile dysfunction   ? Family history of breast cancer   ? Family history of cancer of mouth   ? Family history of CML (chronic monocytic leukemia)   ? Family history of lung cancer   ? Family history of ovarian cancer   ? Family history of stomach cancer   ? Gastroesophageal reflux disease   ? H/O hiatal hernia   ? H/O: substance abuse ([place])   ? Cocaine, marijuana, alcohol.  Quit 2013.   ? Hyperlipidemia   ? Hypertension   ? Non-ST elevation myocardial infarction (NSTEMI), initial episode of care [facility]) 12/02/2013  ? DES LAD  ? Obesity   ? Prostate cancer ([place])   ? Sleep apnea   ? CPAP  ? Tachy-brady syndrome ([place])   ? a. s/p STJ dual chamber  PPM   ? Thyroid disease   ? Tobacco abuse   ? Quit 2014  ? 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 Bilateral  ? ? ?Past Surgical History:  ?Procedure Laterality Date  ? BRAIN SURGERY  2015  ? hematoma evacuation  ? BURR HOLE Right 04/13/2014  ? Procedure: Haskell Flirt;  Surgeon: Charlie Pitter, MD;  Location: [facility]URO [facility];  Service: Neurosurgery;  Laterality: Right;  ? CAROTID ENDARTERECTOMY Right Feb. 25, 2010  ?  CEA  ? COLONOSCOPY WITH PROPOFOL N/A 06/24/2021  ? Procedure: COLONOSCOPY WITH PROPOFOL;  Surgeon: Carol [place], MD;  Location: [facility] ENDOSCOPY;  Service: Endoscopy;  Laterality: N/A;  ? CORONARY  ANGIOPLASTY WITH STENT PLACEMENT  12/03/2013  ? LAD 90%-->0% W/ Promus Premier DES 3.0 mm x 16 mm, CFX OK, RCA 40%, EF 70-75%  ? LEFT ATRIAL APPENDAGE OCCLUSION N/A 08/05/2015  ? Procedure: LEFT ATRIAL APPENDAGE OCCLUSION;  Surgeon: [name], MD;  Location: [place] CV LAB;  Service: Cardiovascular;  Laterality: N/A;  ? LEFT HEART CATHETERIZATION WITH CORONARY ANGIOGRAM Left 12/03/2013  ? Procedure: LEFT HEART CATHETERIZATION WITH CORONARY ANGIOGRAM;  Surgeon: Leonie Man, MD;  Location: [facility] CATH LAB;  Service: Cardiovascular;  Laterality: Left;  ? LEFT HEART CATHETERIZATION WITH CORONARY ANGIOGRAM N/A 01/26/2014  ? Procedure: LEFT HEART CATHETERIZATION WITH CORONARY ANGIOGRAM;  Surgeon: Jettie Booze, MD;  Location: [facility] CATH LAB;  Service: Cardiovascular;  Laterality: N/A;  ? LEFT HEART CATHETERIZATION WITH CORONARY ANGIOGRAM N/A 08/03/2014  ? Procedure: LEFT HEART CATHETERIZATION WITH CORONARY ANGIOGRAM;  Surgeon: Burnell Blanks, MD;  Location: [facility] CATH LAB;  Service: Cardiovascular;  Laterality: N/A;  ? PERCUTANEOUS CORONARY STENT INTERVENTION (PCI-S)  12/03/2013  ? Procedure: PERCUTANEOUS CORONARY STENT INTERVENTION (PCI-S);  Surgeon: Leonie Man, MD;  Location: [facility] CATH LAB;  Service: Cardiovascular;;  ? PERMANENT PACEMAKER INSERTION N/A 09/18/2014  ? Procedure: PERMANENT PACEMAKER INSERTION;  Surgeon: [name], MD;  Location: [facility] CATH LAB;  Service: Cardiovascular;  Laterality: N/A;  ? POLYPECTOMY  06/24/2021  ? Procedure: POLYPECTOMY;  Surgeon: Carol [place], MD;  Location: [facility];  Service: Endoscopy;;  ? RADIOFREQUENCY ABLATION  2005  ? for PSVT  ? TEE WITHOUT CARDIOVERSION N/A 07/27/2015  ? Procedure: TRANSESOPHAGEAL ECHOCARDIOGRAM (TEE);  Surgeon: Lelon Perla, MD;  Location: [place];  Service: Cardiovascular;  Laterality: N/A;  ? TEE WITHOUT CARDIOVERSION N/A 09/15/2015  ? Procedure: TRANSESOPHAGEAL ECHOCARDIOGRAM (TEE);  Surgeon: Thayer Headings, MD;  Location: [place];  Service: Cardiovascular;  Laterality: N/A;  ?  ? ?Home Medications:  ?Prior to Admission medications   ?Medication Sig Start Date End Date Taking? Authorizing Provider  ?allopurinol (ZYLOPRIM) 100 MG tablet TAKE 1 TABLET(100 MG) BY MOUTH DAILY 09/07/20   Susy Frizzle, MD  ?apalutamide (ERLEADA) 60 MG tablet TAKE 4 TABLETS (240 MG TOTAL) BY MOUTH DAILY. MAY BE TAKEN WITH OR WITHOUT FOOD. SWALLOW TABLETS WHOLE. 06/29/21 06/29/22  Derek Jack, MD  ?aspirin EC 325 MG tablet Take 1 tablet (325 mg total) daily by mouth. 04/13/17   Richardson Dopp T, PA-C  ?Blood Glucose Monitoring Suppl (BLOOD GLUCOSE SYSTEM PAK) KIT Use as directed to monitor FSBS 1x daily. Dx: E11.9. 03/04/19   [name], MD  ?CLENPIQ 10-3.5-12 MG-GM -GM/160ML SOLN Take 320 mLs by mouth as directed. 05/12/21   [provider]  ?Cyanocobalamin (VITAMIN B 12) 500 MCG TABS Take 500 mcg by mouth in the morning.    [provider]  ?diclofenac Sodium (  VOLTAREN) 1 % GEL SMARTSIG:2-4 Gram(s) Topical 1-4 Times Daily PRN 08/18/21   [provider]  ?diphenoxylate-atropine (LOMOTIL) 2.5-0.025 MG tablet Take 1 tablet by mouth every 6 (six) hours as needed for diarrhea or loose stools. 12/17/20   [provider]  ?ergocalciferol (VITAMIN D2) 1.25 MG (50000 UT) capsule Take by mouth.    [provider]  ?fluticasone (FLONASE) 50 MCG/ACT nasal spray Place 2 sprays into both nostrils daily. ?Patient taking differently: Place 2 sprays into both nostrils daily as needed for allergies. 06/02/20   Eulogio Bear, NP  ?furosemide (LASIX) 20 MG tablet TAKE 3 TABLETS BY MOUTH DAILY 06/24/19   [name], MD  ?glipiZIDE (GLUCOTROL XL) 5 MG 24 hr tablet TAKE 1 TABLET(5 MG) BY MOUTH DAILY WITH BREAKFAST 07/19/20   [place], [name], MD  ?HOMEOPATHIC PRODUCTS EX Apply topically.    [provider]  ?isosorbide mononitrate (IMDUR) 60 MG 24 hr tablet Take 1 tablet (60 mg total) by mouth daily.  09/05/21   [name], MD  ?levothyroxine (SYNTHROID) 200 MCG tablet TAKE 1 TABLET(200 MCG) BY MOUTH DAILY BEFORE AND BREAKFAST 09/07/20   Brita Romp, NP  ?levothyroxine (SYNTHROID) 25 MCG tablet Take

## 2021-09-11 NOTE — ED Triage Notes (Signed)
C/o pain to center of chest with SOB and nausea x 1 hour.  Pain started after getting out of shower.   ?

## 2021-09-12 ENCOUNTER — Observation Stay (HOSPITAL_COMMUNITY): Payer: Medicare Other

## 2021-09-12 ENCOUNTER — Ambulatory Visit (HOSPITAL_COMMUNITY): Payer: Medicare Other | Admitting: Hematology

## 2021-09-12 ENCOUNTER — Ambulatory Visit (HOSPITAL_COMMUNITY): Payer: Medicare Other

## 2021-09-12 DIAGNOSIS — Z955 Presence of coronary angioplasty implant and graft: Secondary | ICD-10-CM | POA: Diagnosis not present

## 2021-09-12 DIAGNOSIS — Z7982 Long term (current) use of aspirin: Secondary | ICD-10-CM | POA: Diagnosis not present

## 2021-09-12 DIAGNOSIS — I422 Other hypertrophic cardiomyopathy: Secondary | ICD-10-CM | POA: Diagnosis present

## 2021-09-12 DIAGNOSIS — K219 Gastro-esophageal reflux disease without esophagitis: Secondary | ICD-10-CM | POA: Diagnosis present

## 2021-09-12 DIAGNOSIS — I25110 Atherosclerotic heart disease of native coronary artery with unstable angina pectoris: Secondary | ICD-10-CM | POA: Diagnosis present

## 2021-09-12 DIAGNOSIS — N179 Acute kidney failure, unspecified: Secondary | ICD-10-CM | POA: Diagnosis present

## 2021-09-12 DIAGNOSIS — R079 Chest pain, unspecified: Secondary | ICD-10-CM | POA: Diagnosis present

## 2021-09-12 DIAGNOSIS — G4730 Sleep apnea, unspecified: Secondary | ICD-10-CM | POA: Diagnosis present

## 2021-09-12 DIAGNOSIS — Z6841 Body Mass Index (BMI) 40.0 and over, adult: Secondary | ICD-10-CM | POA: Diagnosis not present

## 2021-09-12 DIAGNOSIS — I480 Paroxysmal atrial fibrillation: Secondary | ICD-10-CM | POA: Diagnosis present

## 2021-09-12 DIAGNOSIS — I110 Hypertensive heart disease with heart failure: Secondary | ICD-10-CM | POA: Diagnosis present

## 2021-09-12 DIAGNOSIS — I252 Old myocardial infarction: Secondary | ICD-10-CM | POA: Diagnosis not present

## 2021-09-12 DIAGNOSIS — Z7984 Long term (current) use of oral hypoglycemic drugs: Secondary | ICD-10-CM | POA: Diagnosis not present

## 2021-09-12 DIAGNOSIS — Z7901 Long term (current) use of anticoagulants: Secondary | ICD-10-CM | POA: Diagnosis not present

## 2021-09-12 DIAGNOSIS — E739 Lactose intolerance, unspecified: Secondary | ICD-10-CM | POA: Diagnosis present

## 2021-09-12 DIAGNOSIS — I495 Sick sinus syndrome: Secondary | ICD-10-CM | POA: Diagnosis present

## 2021-09-12 DIAGNOSIS — I471 Supraventricular tachycardia: Secondary | ICD-10-CM | POA: Diagnosis present

## 2021-09-12 DIAGNOSIS — E669 Obesity, unspecified: Secondary | ICD-10-CM | POA: Diagnosis present

## 2021-09-12 DIAGNOSIS — I350 Nonrheumatic aortic (valve) stenosis: Secondary | ICD-10-CM | POA: Diagnosis present

## 2021-09-12 DIAGNOSIS — Z950 Presence of cardiac pacemaker: Secondary | ICD-10-CM | POA: Diagnosis not present

## 2021-09-12 DIAGNOSIS — I5032 Chronic diastolic (congestive) heart failure: Secondary | ICD-10-CM | POA: Diagnosis present

## 2021-09-12 DIAGNOSIS — Z8546 Personal history of malignant neoplasm of prostate: Secondary | ICD-10-CM | POA: Diagnosis not present

## 2021-09-12 DIAGNOSIS — I2510 Atherosclerotic heart disease of native coronary artery without angina pectoris: Secondary | ICD-10-CM | POA: Diagnosis not present

## 2021-09-12 DIAGNOSIS — Z87820 Personal history of traumatic brain injury: Secondary | ICD-10-CM | POA: Diagnosis not present

## 2021-09-12 DIAGNOSIS — D649 Anemia, unspecified: Secondary | ICD-10-CM | POA: Diagnosis present

## 2021-09-12 DIAGNOSIS — E119 Type 2 diabetes mellitus without complications: Secondary | ICD-10-CM | POA: Diagnosis present

## 2021-09-12 LAB — BASIC METABOLIC PANEL
Anion gap: 7 (ref 5–15)
BUN: 13 mg/dL (ref 8–23)
CO2: 24 mmol/L (ref 22–32)
Calcium: 9.1 mg/dL (ref 8.9–10.3)
Chloride: 108 mmol/L (ref 98–111)
Creatinine, Ser: 1.34 mg/dL — ABNORMAL HIGH (ref 0.61–1.24)
GFR, Estimated: 59 mL/min — ABNORMAL LOW (ref >60)
Glucose, Bld: 85 mg/dL (ref 70–99)
Potassium: 3.9 mmol/L (ref 3.5–5.1)
Sodium: 139 mmol/L (ref 135–145)

## 2021-09-12 LAB — LIPID PANEL
Cholesterol: 115 mg/dL (ref 0–200)
HDL: 39 mg/dL — ABNORMAL LOW (ref >40)
LDL Cholesterol: 62 mg/dL (ref 0–99)
Total CHOL/HDL Ratio: 2.9 RATIO
Triglycerides: 69 mg/dL (ref <150)
VLDL: 14 mg/dL (ref 0–40)

## 2021-09-12 LAB — ECHOCARDIOGRAM COMPLETE
Area-P 1/2: 3.27 cm2
Calc EF: 70.1 %
Height: 72 in
S' Lateral: 2.2 cm
Single Plane A2C EF: 76.2 %
Single Plane A4C EF: 68.4 %
Weight: 4804.8 oz

## 2021-09-12 LAB — GLUCOSE, CAPILLARY
Glucose-Capillary: 116 mg/dL — ABNORMAL HIGH (ref 70–99)
Glucose-Capillary: 123 mg/dL — ABNORMAL HIGH (ref 70–99)
Glucose-Capillary: 141 mg/dL — ABNORMAL HIGH (ref 70–99)
Glucose-Capillary: 79 mg/dL (ref 70–99)
Glucose-Capillary: 86 mg/dL (ref 70–99)
Glucose-Capillary: 94 mg/dL (ref 70–99)

## 2021-09-12 MED ORDER — SODIUM CHLORIDE 0.9 % WEIGHT BASED INFUSION
3.0000 mL/kg/h | INTRAVENOUS | Status: AC
  Administered 2021-09-13: 3 mL/kg/h via INTRAVENOUS

## 2021-09-12 MED ORDER — SODIUM CHLORIDE 0.9 % IV SOLN: 250.0000 mL | INTRAVENOUS | Status: DC | PRN

## 2021-09-12 MED ORDER — SODIUM CHLORIDE 0.9 % WEIGHT BASED INFUSION
1.0000 mL/kg/h | INTRAVENOUS | Status: DC
  Administered 2021-09-13: 1 mL/kg/h via INTRAVENOUS

## 2021-09-12 MED ORDER — PERFLUTREN LIPID MICROSPHERE
1.0000 mL | INTRAVENOUS | Status: AC | PRN
  Administered 2021-09-12: 2 mL via INTRAVENOUS
  Filled 2021-09-12: qty 10

## 2021-09-12 MED ORDER — SODIUM CHLORIDE 0.9% FLUSH
3.0000 mL | Freq: Two times a day (BID) | INTRAVENOUS | Status: DC
  Administered 2021-09-12 – 2021-09-14 (×3): 3 mL via INTRAVENOUS

## 2021-09-12 MED ORDER — SODIUM CHLORIDE 0.9% FLUSH: 3.0000 mL | INTRAVENOUS | Status: DC | PRN

## 2021-09-12 MED ORDER — ASPIRIN 81 MG PO CHEW
81.0000 mg | CHEWABLE_TABLET | ORAL | Status: AC
  Administered 2021-09-13: 81 mg via ORAL
  Filled 2021-09-12: qty 1

## 2021-09-12 MED ORDER — MAGNESIUM HYDROXIDE 400 MG/5ML PO SUSP
30.0000 mL | Freq: Every day | ORAL | Status: DC | PRN
  Administered 2021-09-12: 30 mL via ORAL
  Filled 2021-09-12: qty 30

## 2021-09-12 NOTE — Progress Notes (Signed)
?  Echocardiogram ?2D Echocardiogram has been performed. ? ?Peter Dunlap ?09/12/2021, 10:46 AM ?

## 2021-09-12 NOTE — H&P (Addendum)
?Cardiology Consultation:  ? ?Patient ID: Peter Dunlap ?MRN: [id number]; DOB: 1955-07-16 ? ?Admit date: 09/11/2021 ?Date of Consult: 09/12/2021 ? ?PCP:  Dulce Sellar, MD ?  ?[facility] Providers ?Cardiologist:  [name], MD      ? ? ?Patient Profile:  ? ?Peter Dunlap is a 66 y.o. male with a hx of PSVT s/p catheter ablation, hypertensive heart disease with diastolic heart failure, symptomatic bradycardia s/p pacemaker, paroxysmal atrial fibrillation, coronary artery disease s/p prior PCI to the LAD and OM.  He has a hx of falls and prior subdural hematoma s/p evacuations/p Watchman on ASA 325 mg daily, hx of prostate CA (PET 2021-mets within the pelvis) on androgen deprivation therapy . who is being seen 09/12/2021 for the evaluation of chest pain at the request of Dr. Melina Copa. ? ?History of Present Illness:  ? ?Peter Dunlap presents today with progressive chest pressure. He notes some chest pressure and SOB with activity . Today, he was taking a shower and preparing to leave the house when he felt left sided substernal chest pain that persisted. His EKG is notable for frequent PVCs ,compensatory pause and V pacing. Sinus rhythm. He notes some SOB with activity and notes high heart racing. He denies orthopnea, PND or LE edema. His family is by the bedside. ? ?Blood pressures are normal. He's on room air.  Troponin 18 and 20.  He has a mild AKI. Chronic anemai hgb 11-12. He had a CT PE in the ED. Was negative for PE.  Did not aortic valve calcification. Also has cirrhotic appearing liver. ? ?He sees Dr. Lovena Le with normal PPM functioning and continue on sotalol. He saw Richardson Dopp in 2018 for follow up. He has a notable hx of subdural hematoma after a fall. He was on coumadin for afib. He therefore was recommended a Watchman device and is s/p Watchman on 08/05/2015. He had FU TEE that showed normal watchman positioning. No leak. ? ? ?Past Medical History:  ?Diagnosis Date  ? Arteriosclerotic cardiovascular  disease (ASCVD) 2005  ? catheterization in 10/2010:50% mid LAD, diffuse distal disease, circumflex irregularities, large dominant RCA with a 50% ostial, 70% distal, 60% posterolateral and 70% PDA; normal EF  ? Arthritis   ? Benign prostatic hypertrophy   ? Bilateral carpal tunnel syndrome 07/03/2018  ? Cerebrovascular disease 2010  ? R. carotid endarterectomy; Duplex in 10/2010-widely patent ICAs, subtotal left vertebral-not thought to be contributing to symptoms  ? Cervical spine disease   ? CT in 2012-advanced degeneration and spondylosis with moderate spinal stenosis at C3-C6  ? CHF (congestive heart failure) ([place])   ? Depression   ? Diabetes mellitus without complication ([place])   ? Erectile dysfunction   ? Family history of breast cancer   ? Family history of cancer of mouth   ? Family history of CML (chronic monocytic leukemia)   ? Family history of lung cancer   ? Family history of ovarian cancer   ? Family history of stomach cancer   ? Gastroesophageal reflux disease   ? H/O hiatal hernia   ? H/O: substance abuse ([place])   ? Cocaine, marijuana, alcohol.  Quit 2013.   ? Hyperlipidemia   ? Hypertension   ? Non-ST elevation myocardial infarction (NSTEMI), initial episode of care [facility]) 12/02/2013  ? DES LAD  ? Obesity   ? Prostate cancer ([place])   ? Sleep apnea   ? CPAP  ? Tachy-brady syndrome ([place])   ? a. s/p STJ dual chamber  PPM   ? Thyroid disease   ? Tobacco abuse   ? Quit 2014  ? 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 Bilateral  ? ? ?Past Surgical History:  ?Procedure Laterality Date  ? BRAIN SURGERY  2015  ? hematoma evacuation  ? BURR HOLE Right 04/13/2014  ? Procedure: Haskell Flirt;  Surgeon: Charlie Pitter, MD;  Location: [facility];  Service: Neurosurgery;  Laterality: Right;  ? CAROTID ENDARTERECTOMY Right Feb. 25, 2010  ?  CEA  ? COLONOSCOPY WITH PROPOFOL N/A 06/24/2021  ? Procedure: COLONOSCOPY WITH PROPOFOL;  Surgeon: Carol Ada, MD;  Location: [facility] ENDOSCOPY;  Service: Endoscopy;  Laterality: N/A;  ? CORONARY  ANGIOPLASTY WITH STENT PLACEMENT  12/03/2013  ? LAD 90%-->0% W/ Promus Premier DES 3.0 mm x 16 mm, CFX OK, RCA 40%, EF 70-75%  ? LEFT ATRIAL APPENDAGE OCCLUSION N/A 08/05/2015  ? Procedure: LEFT ATRIAL APPENDAGE OCCLUSION;  Surgeon: [name], MD;  Location: [place] CV LAB;  Service: Cardiovascular;  Laterality: N/A;  ? LEFT HEART CATHETERIZATION WITH CORONARY ANGIOGRAM Left 12/03/2013  ? Procedure: LEFT HEART CATHETERIZATION WITH CORONARY ANGIOGRAM;  Surgeon: Leonie Man, MD;  Location: [facility] CATH LAB;  Service: Cardiovascular;  Laterality: Left;  ? LEFT HEART CATHETERIZATION WITH CORONARY ANGIOGRAM N/A 01/26/2014  ? Procedure: LEFT HEART CATHETERIZATION WITH CORONARY ANGIOGRAM;  Surgeon: Jettie Booze, MD;  Location: [facility] CATH LAB;  Service: Cardiovascular;  Laterality: N/A;  ? LEFT HEART CATHETERIZATION WITH CORONARY ANGIOGRAM N/A 08/03/2014  ? Procedure: LEFT HEART CATHETERIZATION WITH CORONARY ANGIOGRAM;  Surgeon: Burnell Blanks, MD;  Location: [facility] CATH LAB;  Service: Cardiovascular;  Laterality: N/A;  ? PERCUTANEOUS CORONARY STENT INTERVENTION (PCI-S)  12/03/2013  ? Procedure: PERCUTANEOUS CORONARY STENT INTERVENTION (PCI-S);  Surgeon: Leonie Man, MD;  Location: [facility] CATH LAB;  Service: Cardiovascular;;  ? PERMANENT PACEMAKER INSERTION N/A 09/18/2014  ? Procedure: PERMANENT PACEMAKER INSERTION;  Surgeon: [name], MD;  Location: [facility] CATH LAB;  Service: Cardiovascular;  Laterality: N/A;  ? POLYPECTOMY  06/24/2021  ? Procedure: POLYPECTOMY;  Surgeon: Carol Ada, MD;  Location: [facility];  Service: Endoscopy;;  ? RADIOFREQUENCY ABLATION  2005  ? for PSVT  ? TEE WITHOUT CARDIOVERSION N/A 07/27/2015  ? Procedure: TRANSESOPHAGEAL ECHOCARDIOGRAM (TEE);  Surgeon: Lelon Perla, MD;  Location: [place];  Service: Cardiovascular;  Laterality: N/A;  ? TEE WITHOUT CARDIOVERSION N/A 09/15/2015  ? Procedure: TRANSESOPHAGEAL ECHOCARDIOGRAM (TEE);  Surgeon: Thayer Headings, MD;  Location: [place];  Service: Cardiovascular;  Laterality: N/A;  ?  ? ?Home Medications:  ?Prior to Admission medications   ?Medication Sig Start Date End Date Taking? Authorizing Provider  ?allopurinol (ZYLOPRIM) 100 MG tablet TAKE 1 TABLET(100 MG) BY MOUTH DAILY 09/07/20   Susy Frizzle, MD  ?apalutamide (ERLEADA) 60 MG tablet TAKE 4 TABLETS (240 MG TOTAL) BY MOUTH DAILY. MAY BE TAKEN WITH OR WITHOUT FOOD. SWALLOW TABLETS WHOLE. 06/29/21 06/29/22  Derek Jack, MD  ?aspirin EC 325 MG tablet Take 1 tablet (325 mg total) daily by mouth. 04/13/17   Richardson Dopp T, PA-C  ?Blood Glucose Monitoring Suppl (BLOOD GLUCOSE SYSTEM PAK) KIT Use as directed to monitor FSBS 1x daily. Dx: E11.9. 03/04/19   [name], MD  ?CLENPIQ 10-3.5-12 MG-GM -GM/160ML SOLN Take 320 mLs by mouth as directed. 05/12/21   [provider]  ?Cyanocobalamin (VITAMIN B 12) 500 MCG TABS Take 500 mcg by mouth in the morning.    [provider]  ?diclofenac Sodium (  VOLTAREN) 1 % GEL SMARTSIG:2-4 Gram(s) Topical 1-4 Times Daily PRN 08/18/21   [provider]  ?diphenoxylate-atropine (LOMOTIL) 2.5-0.025 MG tablet Take 1 tablet by mouth every 6 (six) hours as needed for diarrhea or loose stools. 12/17/20   [provider]  ?ergocalciferol (VITAMIN D2) 1.25 MG (50000 UT) capsule Take by mouth.    [provider]  ?fluticasone (FLONASE) 50 MCG/ACT nasal spray Place 2 sprays into both nostrils daily. ?Patient taking differently: Place 2 sprays into both nostrils daily as needed for allergies. 06/02/20   Eulogio Bear, NP  ?furosemide (LASIX) 20 MG tablet TAKE 3 TABLETS BY MOUTH DAILY 06/24/19   [name], MD  ?glipiZIDE (GLUCOTROL XL) 5 MG 24 hr tablet TAKE 1 TABLET(5 MG) BY MOUTH DAILY WITH BREAKFAST 07/19/20   Destrehan, [name], MD  ?HOMEOPATHIC PRODUCTS EX Apply topically.    [provider]  ?isosorbide mononitrate (IMDUR) 60 MG 24 hr tablet Take 1 tablet (60 mg total) by mouth daily.  09/05/21   [name], MD  ?levothyroxine (SYNTHROID) 200 MCG tablet TAKE 1 TABLET(200 MCG) BY MOUTH DAILY BEFORE AND BREAKFAST 09/07/20   Brita Romp, NP  ?levothyroxine (SYNTHROID) 25 MCG tablet Ta

## 2021-09-12 NOTE — Progress Notes (Signed)
?  Transition of Care (TOC) Screening Note ? ? ?Patient Details  ?Name: Peter Dunlap ?Date of Birth: May 10, 1956 ? ? ?Transition of Care (TOC) CM/SW Contact:    ?Tom-Johnson, Renea Ee, RN ?Phone Number: ?09/12/2021, 3:58 PM ? ?Patient is admitted for Chest Pains at rest. Ha extensive Cardiac Hx. MD to discuss with patient and wife about about possible Cath and if agreeable, will Cath tomorrow.  ?Transition of Care Department [facility]) has reviewed patient and no TOC needs or recommendations have been identified at this time. TOC will continue to monitor patient advancement through interdisciplinary progression rounds. If new patient transition needs arise, please place a TOC consult. ?  ?

## 2021-09-12 NOTE — Progress Notes (Addendum)
? ?Progress Note ? ?Patient Name: Peter Dunlap ?Date of Encounter: 09/12/2021 ? ?[facility] Cardiologist: [name], MD  ? ?Subjective  ? ?No chest pain now however sx of progressive angina at home. Takes nitro and is relieved. Last PCI was 2015. Discussed cath and will await his wife's arrival to discuss further.  ? ?Inpatient Medications  ?  ?Scheduled Meds: ? allopurinol  100 mg Oral Daily  ? apalutamide  240 mg Oral Daily  ? aspirin EC  325 mg Oral Daily  ? darifenacin  15 mg Oral Daily  ? furosemide  60 mg Oral Daily  ? heparin  5,000 Units Subcutaneous Q8H  ? isosorbide mononitrate  60 mg Oral Daily  ? levothyroxine  200 mcg Oral Q0600  ? magnesium oxide  400 mg Oral BID  ? nitroGLYCERIN  0.4 mg Sublingual Once  ? pantoprazole  40 mg Oral Daily  ? rosuvastatin  40 mg Oral QPM  ? sotalol  160 mg Oral q morning  ? sotalol  80 mg Oral QHS  ? ?Continuous Infusions: ? ?PRN Meds: ?acetaminophen, nitroGLYCERIN, ondansetron (ZOFRAN) IV, oxyCODONE-acetaminophen **AND** oxyCODONE  ? ?Vital Signs  ?  ?Vitals:  ? 09/11/21 2055 09/11/21 2103 09/12/21 0052 09/12/21 0514  ?BP: (!) 91/50 107/60 106/62 110/62  ?Pulse: 63 68 63 63  ?Resp: 16   18  ?Temp: 97.7 ?F (36.5 ?C)  98.3 ?F (36.8 ?C) 97.6 ?F (36.4 ?C)  ?TempSrc: Oral  Oral Oral  ?SpO2: 99% 100%  100%  ?Weight:      ?Height:      ? ? ?Intake/Output Summary (Last 24 hours) at 09/12/2021 0858 ?Last data filed at 09/11/2021 1704 ?Gross per 24 hour  ?Intake 500 ml  ?Output --  ?Net 500 ml  ? ? ?  09/11/2021  ?  6:22 PM 07/11/2021  ?  1:12 PM 06/24/2021  ?  9:04 AM  ?Last 3 Weights  ?Weight (lbs) 300 lb 4.8 oz 310 lb 9.6 oz 307 lb 1.6 oz  ?Weight (kg) 136.215 kg 140.887 kg 139.3 kg  ?   ? ?Telemetry  ?  ?A paced, APVP, occasional PVCs - Personally Reviewed ? ?ECG  ?  ?VP - Personally Reviewed ? ?Physical Exam  ? ?GEN: No acute distress.   ?Neck: No JVD ?Cardiac: RRR, no murmurs, rubs, or gallops.  ?Respiratory: Clear to auscultation bilaterally. ?GI: Soft, nontender,  non-distended  ?MS: No edema; No deformity. ?Neuro:  Nonfocal  ?Psych: Normal affect  ? ?Labs  ?  ?High Sensitivity Troponin:   ?Recent Labs  ?Lab 09/11/21 ?1305 09/11/21 ?1503 09/11/21 ?1659 09/11/21 ?1852  ?TROPONINIHS 18* 20* 20* 19*  ?   ?Chemistry ?Recent Labs  ?Lab 09/11/21 ?1305 09/11/21 ?1659 09/12/21 ?0458  ?NA 134*  --  139  ?K 3.9  --  3.9  ?CL 105  --  108  ?CO2 20*  --  24  ?GLUCOSE 97  --  85  ?BUN 13  --  13  ?CREATININE 1.39* 1.38* 1.34*  ?CALCIUM 9.0  --  9.1  ?GFRNONAA 56* 57* 59*  ?ANIONGAP 9  --  7  ?  ?Lipids  ?Recent Labs  ?Lab 09/12/21 ?5885  ?CHOL 115  ?TRIG 69  ?HDL 39*  ?[place] 62  ?CHOLHDL 2.9  ?  ?Hematology ?Recent Labs  ?Lab 09/11/21 ?1305 09/11/21 ?0277  ?WBC 6.8 5.8  ?RBC 3.61* 3.32*  ?HGB 11.0* 10.1*  ?HCT 34.0* 31.7*  ?MCV 94.2 95.5  ?MCH 30.5 30.4  ?MCHC 32.4  31.9  ?RDW 14.9 15.0  ?PLT 136* 102*  ? ?Thyroid No results for input(s): TSH, FREET4 in the last 168 hours.  ?BNPNo results for input(s): BNP, PROBNP in the last 168 hours.  ?DDimer No results for input(s): DDIMER in the last 168 hours.  ? ?Radiology  ?  ?DG Chest 2 View ? ?Result Date: 09/11/2021 ?CLINICAL DATA:  Pt c/o central chest pain x 1 day. Hx of CHF, DM, HTN, NSTEMI. Pt is a former smoker. EXAM: CHEST - 2 VIEW COMPARISON:  12/19/2014 FINDINGS: Cardiac silhouette is normal in size. No mediastinal or hilar masses or evidence of adenopathy. Left anterior chest wall sequential pacemaker is stable. Clear lungs.  No pleural effusion or pneumothorax. Skeletal structures are intact. IMPRESSION: No active cardiopulmonary disease. Electronically Signed   By: Lajean Manes M.D.   On: 09/11/2021 14:29  ? ?CT Angio Chest PE W and/or Wo Contrast ? ?Result Date: 09/11/2021 ?CLINICAL DATA:  PE suspected, chest pain EXAM: CT ANGIOGRAPHY CHEST WITH CONTRAST TECHNIQUE: Multidetector CT imaging of the chest was performed using the standard protocol during bolus administration of intravenous contrast. Multiplanar CT image reconstructions  and MIPs were obtained to evaluate the vascular anatomy. RADIATION DOSE REDUCTION: This exam was performed according to the departmental dose-optimization program which includes automated exposure control, adjustment of the mA and/or kV according to patient size and/or use of iterative reconstruction technique. CONTRAST:  121m OMNIPAQUE IOHEXOL 350 MG/ML SOLN COMPARISON:  None. FINDINGS: Cardiovascular: Satisfactory opacification of the pulmonary arteries to the segmental level. No evidence of pulmonary embolism. Cardiomegaly. Three-vessel coronary artery calcifications. Left atrial appendage occlusion device. No pericardial effusion. Aortic atherosclerosis. Aortic valve calcifications. Left chest multi lead pacer. Mediastinum/Nodes: No enlarged mediastinal, hilar, or axillary lymph nodes. Thyroid gland, trachea, and esophagus demonstrate no significant findings. Lungs/Pleura: Mosaic attenuation of the bilateral lung bases. No pleural effusion or pneumothorax. Upper Abdomen: No acute abnormality. Coarse, nodular, cirrhotic morphology of the liver partially imaged in the upper abdomen. Musculoskeletal: No chest wall abnormality. No acute osseous findings. Review of the MIP images confirms the above findings. IMPRESSION: 1. Negative examination for pulmonary embolism. 2. Mosaic attenuation of the airspaces in the bilateral lung bases, suggesting small airways disease. 3. Cardiomegaly and coronary artery disease. 4. Aortic valve calcifications. Correlate for echocardiographic evidence of aortic valve dysfunction. 5. Coarse, nodular, cirrhotic morphology of the liver. Aortic Atherosclerosis (ICD10-I70.0). Electronically Signed   By: ADelanna AhmadiM.D.   On: 09/11/2021 15:12   ? ?Cardiac Studies  ? ?Echo pending ? ?Patient Profile  ?   ?Peter BACALLAOis a 66y.o. male with a hx of PSVT s/p catheter ablation, hypertensive heart disease with diastolic heart failure, symptomatic bradycardia s/p pacemaker, paroxysmal  atrial fibrillation, coronary artery disease s/p prior PCI to the LAD and OM.  He has a hx of falls and prior subdural hematoma s/p evacuations/p Watchman on ASA 325 mg daily, hx of prostate CA (PET 2021-mets within the pelvis) on androgen deprivation therapy . who is being seen 09/12/2021 for the evaluation of chest pain at the request of Dr. BMelina Copa  ? ?Assessment & Plan  ?  ?Unstable Angina: ?- He does have high risk with hx of ischemic heart disease s/p PCI LAD/OM last stent with MI in 2015. ?- sx of progressive angina. He takes nitro at home and is on imdur. Warrants discussion about cath. Will obtain echo today and discuss with wife and if agreeable, will cath tomorrow. Complicating factor is DAPT with hx  of brain bleed.  ?- continue to trend troponins; flat and low ?- echo to assess for any significant WMA, EF ?- continue asa 325 mg daily [ planned indefinitely with Watchman] ?- nitro SL PRN, resume imdur 60 mg daily ?- continue his home sotalol 160 mg in the AM, 80 mg in the PM ?- continue home crestor 40 mg daily ?   ?Hx of Afib/ s/p hx of PSVT ablation. Tachy-Brady s/p DC Abbott PPM - sinus rhythm ?- continue home sotalol ?- last transmission, DDD. minimal V pacing. <1% mode switches; AT/AF burden 24%. Normal thresholds/capture.  08/10/2021 ?- follows with Dr. Lovena Le ?  ?HFpEF ?- euvolemic  ?- can continue home lasix  ?   ? ?For questions or updates, please contact [place] ?Please consult [email or web address] for contact info under  ? ?  ?   ?Signed, ?Elouise Munroe, MD  ?09/12/2021, 8:58 AM   ? ?ADDENDUM: I spoke to the patient's wife and the patient after echocardiogram which showed hyperdynamic LV function and no regional wall motion abnormalities.  He presents with progressive angina with exertion but is chest pain-free at the moment.  We discussed the complex nature of an ongoing requirement for DAPT if we send for cath and he requires PCI.  We discussed up titration of medical therapy for medical  management of angina and outpatient evaluation in 2 weeks.  If continued chest pain then cath at that time.  He and his wife would prefer inpatient cath.  Hemoglobin is 10.1 and platelet count 102,000.  I am conce

## 2021-09-12 NOTE — Care Management Obs Status (Signed)
MEDICARE OBSERVATION STATUS NOTIFICATION ? ? ?Patient Details  ?Name: Peter Dunlap ?MRN: [id number] ?Date of Birth: 1955-09-20 ? ? ?Medicare Observation Status Notification Given:  Yes ? ? ? ?Tom-Johnson, Renea Ee, RN ?09/12/2021, 3:55 PM ?

## 2021-09-13 ENCOUNTER — Encounter (HOSPITAL_COMMUNITY)
Admission: EM | Disposition: A | Discharge status: Home / Self Care | Payer: Self-pay | Source: Home / Self Care | Attending: Internal Medicine

## 2021-09-13 ENCOUNTER — Encounter (HOSPITAL_COMMUNITY): Payer: Self-pay | Admitting: Internal Medicine

## 2021-09-13 ENCOUNTER — Inpatient Hospital Stay (HOSPITAL_COMMUNITY): Payer: Medicare Other

## 2021-09-13 DIAGNOSIS — I2510 Atherosclerotic heart disease of native coronary artery without angina pectoris: Secondary | ICD-10-CM

## 2021-09-13 HISTORY — PX: LEFT HEART CATH AND CORONARY ANGIOGRAPHY: CATH118249

## 2021-09-13 LAB — CBC WITH DIFFERENTIAL/PLATELET
Abs Immature Granulocytes: 0.02 10*3/uL (ref 0.00–0.07)
Basophils Absolute: 0.1 10*3/uL (ref 0.0–0.1)
Basophils Relative: 1 %
Eosinophils Absolute: 0.1 10*3/uL (ref 0.0–0.5)
Eosinophils Relative: 1 %
HCT: 32 % — ABNORMAL LOW (ref 39.0–52.0)
Hemoglobin: 10.6 g/dL — ABNORMAL LOW (ref 13.0–17.0)
Immature Granulocytes: 0 %
Lymphocytes Relative: 35 %
Lymphs Abs: 1.7 10*3/uL (ref 0.7–4.0)
MCH: 30.2 pg (ref 26.0–34.0)
MCHC: 33.1 g/dL (ref 30.0–36.0)
MCV: 91.2 fL (ref 80.0–100.0)
Monocytes Absolute: 0.5 10*3/uL (ref 0.1–1.0)
Monocytes Relative: 11 %
Neutro Abs: 2.6 10*3/uL (ref 1.7–7.7)
Neutrophils Relative %: 52 %
Platelets: 109 10*3/uL — ABNORMAL LOW (ref 150–400)
RBC: 3.51 MIL/uL — ABNORMAL LOW (ref 4.22–5.81)
RDW: 14.8 % (ref 11.5–15.5)
WBC: 5 10*3/uL (ref 4.0–10.5)
nRBC: 0 % (ref 0.0–0.2)

## 2021-09-13 LAB — GLUCOSE, CAPILLARY
Glucose-Capillary: 181 mg/dL — ABNORMAL HIGH (ref 70–99)
Glucose-Capillary: 135 mg/dL — ABNORMAL HIGH (ref 70–99)
Glucose-Capillary: 78 mg/dL (ref 70–99)

## 2021-09-13 LAB — BASIC METABOLIC PANEL
Anion gap: 8 (ref 5–15)
BUN: 16 mg/dL (ref 8–23)
CO2: 25 mmol/L (ref 22–32)
Calcium: 9 mg/dL (ref 8.9–10.3)
Chloride: 104 mmol/L (ref 98–111)
Creatinine, Ser: 1.34 mg/dL — ABNORMAL HIGH (ref 0.61–1.24)
GFR, Estimated: 59 mL/min — ABNORMAL LOW (ref >60)
Glucose, Bld: 109 mg/dL — ABNORMAL HIGH (ref 70–99)
Potassium: 3.6 mmol/L (ref 3.5–5.1)
Sodium: 137 mmol/L (ref 135–145)

## 2021-09-13 SURGERY — LEFT HEART CATH AND CORONARY ANGIOGRAPHY
Anesthesia: LOCAL

## 2021-09-13 MED ORDER — HEPARIN (PORCINE) IN NACL 1000-0.9 UT/500ML-% IV SOLN
INTRAVENOUS | Status: AC
  Filled 2021-09-13: qty 500

## 2021-09-13 MED ORDER — VERAPAMIL HCL 2.5 MG/ML IV SOLN
INTRAVENOUS | Status: AC
  Filled 2021-09-13: qty 2

## 2021-09-13 MED ORDER — MIDAZOLAM HCL 2 MG/2ML IJ SOLN
INTRAMUSCULAR | Status: DC | PRN
  Administered 2021-09-13: 1 mg via INTRAVENOUS

## 2021-09-13 MED ORDER — LIDOCAINE HCL (PF) 1 % IJ SOLN
INTRAMUSCULAR | Status: DC | PRN
  Administered 2021-09-13: 2 mL

## 2021-09-13 MED ORDER — HEPARIN SODIUM (PORCINE) 1000 UNIT/ML IJ SOLN
INTRAMUSCULAR | Status: DC | PRN
  Administered 2021-09-13: 5000 [IU] via INTRAVENOUS

## 2021-09-13 MED ORDER — HEPARIN (PORCINE) IN NACL 1000-0.9 UT/500ML-% IV SOLN
INTRAVENOUS | Status: AC
Start: 2021-09-13 — End: ?
  Filled 2021-09-13: qty 500

## 2021-09-13 MED ORDER — MIDAZOLAM HCL 2 MG/2ML IJ SOLN
INTRAMUSCULAR | Status: AC
  Filled 2021-09-13: qty 2

## 2021-09-13 MED ORDER — FENTANYL CITRATE (PF) 100 MCG/2ML IJ SOLN
INTRAMUSCULAR | Status: AC
Start: 2021-09-13 — End: ?
  Filled 2021-09-13: qty 2

## 2021-09-13 MED ORDER — VERAPAMIL HCL 2.5 MG/ML IV SOLN
INTRAVENOUS | Status: DC | PRN
  Administered 2021-09-13: 10 mL via INTRA_ARTERIAL

## 2021-09-13 MED ORDER — LIDOCAINE HCL (PF) 1 % IJ SOLN
INTRAMUSCULAR | Status: AC
  Filled 2021-09-13: qty 30

## 2021-09-13 MED ORDER — HEPARIN (PORCINE) IN NACL 1000-0.9 UT/500ML-% IV SOLN
INTRAVENOUS | Status: DC | PRN
  Administered 2021-09-13 (×2): 500 mL

## 2021-09-13 MED ORDER — HEPARIN SODIUM (PORCINE) 1000 UNIT/ML IJ SOLN
INTRAMUSCULAR | Status: AC
  Filled 2021-09-13: qty 10

## 2021-09-13 MED ORDER — IOHEXOL 350 MG/ML SOLN
INTRAVENOUS | Status: DC | PRN
  Administered 2021-09-13: 100 mL

## 2021-09-13 MED ORDER — FENTANYL CITRATE (PF) 100 MCG/2ML IJ SOLN
INTRAMUSCULAR | Status: DC | PRN
  Administered 2021-09-13: 25 ug via INTRAVENOUS

## 2021-09-13 SURGICAL SUPPLY — 20 items
BAND CMPR LRG ZPHR (HEMOSTASIS) ×1
BAND ZEPHYR COMPRESS 30 LONG (HEMOSTASIS) ×1 IMPLANT
CATH INFINITI 5 FR JL3.5 (CATHETERS) ×1 IMPLANT
CATH INFINITI 6F ANG MULTIPACK (CATHETERS) ×1 IMPLANT
CATH INFINITI 6F FL3.5 (CATHETERS) ×1 IMPLANT
CATH LANGSTON DUAL LUM PIG 6FR (CATHETERS) ×1 IMPLANT
CATH LAUNCHER 5F EBU3.0 (CATHETERS) IMPLANT
CATHETER LAUNCHER 5F EBU3.0 (CATHETERS) ×2
GLIDESHEATH SLEND SS 6F .021 (SHEATH) ×1 IMPLANT
GUIDEWIRE INQWIRE 1.5J.035X260 (WIRE) IMPLANT
GUIDEWIRE TIGER .035X300 (WIRE) ×1 IMPLANT
INQWIRE 1.5J .035X260CM (WIRE) ×6
KIT ESSENTIALS PG (KITS) ×1 IMPLANT
KIT HEART LEFT (KITS) ×2 IMPLANT
PACK CARDIAC CATHETERIZATION (CUSTOM PROCEDURE TRAY) ×2 IMPLANT
TRANSDUCER W/STOPCOCK (MISCELLANEOUS) ×2 IMPLANT
TUBING CIL FLEX 10 FLL-RA (TUBING) ×2 IMPLANT
WIRE EMERALD 3MM-J .035X150CM (WIRE) ×1 IMPLANT
WIRE EMERALD 3MM-J .035X260CM (WIRE) ×1 IMPLANT
WIRE EMERALD ST .035X260CM (WIRE) ×1 IMPLANT

## 2021-09-13 NOTE — Progress Notes (Addendum)
? ?Progress Note ? ?Patient Name: Peter Dunlap ?Date of Encounter: 09/13/2021 ? ?Peter Dunlap: Peter Peru, MD  ? ?Subjective  ? ?Plan for cardiac cath today. Brief chest pain overnight, could have been gas.  ? ?Inpatient Medications  ?  ?Scheduled Meds: ? allopurinol  100 mg Oral Daily  ? apalutamide  240 mg Oral Daily  ? aspirin EC  325 mg Oral Daily  ? darifenacin  15 mg Oral Daily  ? furosemide  60 mg Oral Daily  ? heparin  5,000 Units Subcutaneous Q8H  ? isosorbide mononitrate  60 mg Oral Daily  ? levothyroxine  200 mcg Oral Q0600  ? magnesium oxide  400 mg Oral BID  ? pantoprazole  40 mg Oral Daily  ? rosuvastatin  40 mg Oral QPM  ? sodium chloride flush  3 mL Intravenous Q12H  ? sotalol  160 mg Oral q morning  ? sotalol  80 mg Oral QHS  ? ?Continuous Infusions: ? sodium chloride    ? sodium chloride 1 mL/kg/hr (09/13/21 0540)  ? ?PRN Meds: ?sodium chloride, acetaminophen, magnesium hydroxide, nitroGLYCERIN, ondansetron (ZOFRAN) IV, oxyCODONE-acetaminophen **AND** oxyCODONE, sodium chloride flush  ? ?Vital Signs  ?  ?Vitals:  ? 09/12/21 0514 09/12/21 1428 09/12/21 2055 09/13/21 0332  ?BP: 110/62 97/64  (!) 112/59  ?Pulse: 63 62  61  ?Resp: '18 17 16   '$ ?Temp: 97.6 ?F (36.4 ?C) 98.1 ?F (36.7 ?C) 97.6 ?F (36.4 ?C) 97.7 ?F (36.5 ?C)  ?TempSrc: Oral Oral Oral Oral  ?SpO2: 100% 95% 94% 95%  ?Weight:    131 kg  ?Height:      ? ?No intake or output data in the 24 hours ending 09/13/21 0809 ? ?  09/13/2021  ?  3:32 AM 09/11/2021  ?  6:22 PM 07/11/2021  ?  1:12 PM  ?Last 3 Weights  ?Weight (lbs) 288 lb 12.8 oz 300 lb 4.8 oz 310 lb 9.6 oz  ?Weight (kg) 130.999 kg 136.215 kg 140.887 kg  ?   ? ?Telemetry  ?  ?A sense, V paced, PVCs, HR 60s - Personally Reviewed ? ?ECG  ?  ?pending - Personally Reviewed ? ?Physical Exam  ? ?GEN: No acute distress.   ?Neck: No JVD ?Cardiac: RRR, no murmurs, rubs, or gallops.  ?Respiratory: minimal wheezing. ?GI: Soft, nontender, non-distended  ?MS: No edema; No  deformity. ?Neuro:  Nonfocal  ?Psych: Normal affect  ? ?Labs  ?  ?High Sensitivity Troponin:   ?Recent Labs  ?Lab 09/11/21 ?1305 09/11/21 ?1503 09/11/21 ?1659 09/11/21 ?1852  ?TROPONINIHS 18* 20* 20* 19*  ?   ?Chemistry ?Recent Labs  ?Lab 09/11/21 ?1305 09/11/21 ?1659 09/12/21 ?0458 09/13/21 ?7412  ?NA 134*  --  139 137  ?K 3.9  --  3.9 3.6  ?CL 105  --  108 104  ?CO2 20*  --  24 25  ?GLUCOSE 97  --  85 109*  ?BUN 13  --  13 16  ?CREATININE 1.39* 1.38* 1.34* 1.34*  ?CALCIUM 9.0  --  9.1 9.0  ?GFRNONAA 56* 57* 59* 59*  ?ANIONGAP 9  --  7 8  ?  ?Lipids  ?Recent Labs  ?Lab 09/12/21 ?8786  ?CHOL 115  ?TRIG 69  ?HDL 39*  ?[place] 62  ?CHOLHDL 2.9  ?  ?Hematology ?Recent Labs  ?Lab 09/11/21 ?1305 09/11/21 ?1659 09/13/21 ?7672  ?WBC 6.8 5.8 5.0  ?RBC 3.61* 3.32* 3.51*  ?HGB 11.0* 10.1* 10.6*  ?HCT 34.0* 31.7* 32.0*  ?MCV 94.2 95.5  91.2  ?MCH 30.5 30.4 30.2  ?MCHC 32.4 31.9 33.1  ?RDW 14.9 15.0 14.8  ?PLT 136* 102* 109*  ? ?Thyroid No results for input(s): TSH, FREET4 in the last 168 hours.  ?BNPNo results for input(s): BNP, PROBNP in the last 168 hours.  ?DDimer No results for input(s): DDIMER in the last 168 hours.  ? ?Radiology  ?  ?DG Chest 2 View ? ?Result Date: 09/11/2021 ?CLINICAL DATA:  Pt c/o central chest pain x 1 day. Hx of CHF, DM, HTN, NSTEMI. Pt is a former smoker. EXAM: CHEST - 2 VIEW COMPARISON:  12/19/2014 FINDINGS: Cardiac silhouette is normal in size. No mediastinal or hilar masses or evidence of adenopathy. Left anterior chest wall sequential pacemaker is stable. Clear lungs.  No pleural effusion or pneumothorax. Skeletal structures are intact. IMPRESSION: No active cardiopulmonary disease. Electronically Signed   By: Peter Dunlap M.D.   On: 09/11/2021 14:29  ? ?CT Angio Chest PE W and/or Wo Contrast ? ?Result Date: 09/11/2021 ?CLINICAL DATA:  PE suspected, chest pain EXAM: CT ANGIOGRAPHY CHEST WITH CONTRAST TECHNIQUE: Multidetector CT imaging of the chest was performed using the standard protocol during  bolus administration of intravenous contrast. Multiplanar CT image reconstructions and MIPs were obtained to evaluate the vascular anatomy. RADIATION DOSE REDUCTION: This exam was performed according to the departmental dose-optimization program which includes automated exposure control, adjustment of the mA and/or kV according to patient size and/or use of iterative reconstruction technique. CONTRAST:  119m OMNIPAQUE IOHEXOL 350 MG/ML SOLN COMPARISON:  None. FINDINGS: Cardiovascular: Satisfactory opacification of the pulmonary arteries to the segmental level. No evidence of pulmonary embolism. Cardiomegaly. Three-vessel coronary artery calcifications. Left atrial appendage occlusion device. No pericardial effusion. Aortic atherosclerosis. Aortic valve calcifications. Left chest multi lead pacer. Mediastinum/Nodes: No enlarged mediastinal, hilar, or axillary lymph nodes. Thyroid gland, trachea, and esophagus demonstrate no significant findings. Lungs/Pleura: Mosaic attenuation of the bilateral lung bases. No pleural effusion or pneumothorax. Upper Abdomen: No acute abnormality. Coarse, nodular, cirrhotic morphology of the liver partially imaged in the upper abdomen. Musculoskeletal: No chest wall abnormality. No acute osseous findings. Review of the MIP images confirms the above findings. IMPRESSION: 1. Negative examination for pulmonary embolism. 2. Mosaic attenuation of the airspaces in the bilateral lung bases, suggesting small airways disease. 3. Cardiomegaly and coronary artery disease. 4. Aortic valve calcifications. Correlate for echocardiographic evidence of aortic valve dysfunction. 5. Coarse, nodular, cirrhotic morphology of the liver. Aortic Atherosclerosis (ICD10-I70.0). Electronically Signed   By: Peter AhmadiM.D.   On: 09/11/2021 15:12  ? ?ECHOCARDIOGRAM COMPLETE ? ?Result Date: 09/12/2021 ?   ECHOCARDIOGRAM REPORT   Patient Name:   Peter BASTONDate of Exam: 09/12/2021 Medical Rec #:  [id number]         Height:       72.0 in Accession #:    [id number]       Weight:       300.3 lb Date of Birth:  [birth year]        BSA:          2.532 m? Patient Age:    89years          BP:           110/62 mmHg Patient Gender: M                 HR:           71 bpm. Exam Location:  Inpatient Procedure: 2D Echo, Cardiac  Doppler, Color Doppler and Intracardiac            Opacification Agent Indications:    R07.9* Chest pain, unspecified  History:        Patient has prior history of Echocardiogram examinations, most                 recent 09/15/2015. CHF, Previous Myocardial Infarction; Risk                 Factors:Current Smoker, Sleep Apnea, Hypertension, Dyslipidemia                 and Diabetes.  Sonographer:    Bernadene Person RDCS Referring Phys: [phone] [facility]  Sonographer Comments: Suboptimal subcostal window and patient is morbidly obese. IMPRESSIONS  1. Left ventricular ejection fraction, by estimation, is 70 to 75%. The left ventricle has hyperdynamic function. The left ventricle has no regional wall motion abnormalities. There is severe concentric left ventricular hypertrophy. Left ventricular diastolic parameters are consistent with Grade I diastolic dysfunction (impaired relaxation). Elevated left atrial pressure.  2. There is a mild LVOT gradient due to LV outflow tract narrowing in the setting of severe LVH. There is no valvular SAM.  3. Right ventricular systolic function is normal. The right ventricular size is normal. There is normal pulmonary artery systolic pressure. The estimated right ventricular systolic pressure is 02.5 mmHg.  4. The mitral valve is grossly normal. Trivial mitral valve regurgitation.  5. The aortic valve is tricuspid. There is moderate calcification of the aortic valve. There is moderate thickening of the aortic valve. Aortic valve regurgitation is trivial. Aortic valve sclerosis/calcification is present, without any evidence of aortic stenosis.  6. Aortic dilatation noted. There  is borderline dilatation of the ascending aorta, measuring 36 mm. Comparison(s): Compared to prior TTE in 2016, there is no significant change. FINDINGS  Left Ventricle: Left ventricular ejection fraction, by estimati

## 2021-09-13 NOTE — Discharge Summary (Signed)
Error- see Discharge Summary from Dr. Margaretann Loveless  ? ?Margie Billet, PA-C ?09/15/2021 3:17 PM ? ?

## 2021-09-13 NOTE — Progress Notes (Signed)
Patient noted to have a prolonged QT on EKG today 4/11. QT/Qtc 570/570 with AV dual-pacing. EKG reviewed with attending and EP. Recommend keeping the patient overnight and rechecking EKG in the morning. May require dose adjustments of sotalol tomorrow. Informed patient and family that he would be staying the night for observation with EKG in the morning. All questions answered.  ? ?Margie Billet, PA-C ?09/13/2021 5:25 PM ? ?

## 2021-09-13 NOTE — H&P (View-Only) (Signed)
? ?Progress Note ? ?Patient Name: Peter Dunlap ?Date of Encounter: 09/13/2021 ? ?[facility] Cardiologist: Peter Peru, MD  ? ?Subjective  ? ?Plan for cardiac cath today. Brief chest pain overnight, could have been gas.  ? ?Inpatient Medications  ?  ?Scheduled Meds: ? allopurinol  100 mg Oral Daily  ? apalutamide  240 mg Oral Daily  ? aspirin EC  325 mg Oral Daily  ? darifenacin  15 mg Oral Daily  ? furosemide  60 mg Oral Daily  ? heparin  5,000 Units Subcutaneous Q8H  ? isosorbide mononitrate  60 mg Oral Daily  ? levothyroxine  200 mcg Oral Q0600  ? magnesium oxide  400 mg Oral BID  ? pantoprazole  40 mg Oral Daily  ? rosuvastatin  40 mg Oral QPM  ? sodium chloride flush  3 mL Intravenous Q12H  ? sotalol  160 mg Oral q morning  ? sotalol  80 mg Oral QHS  ? ?Continuous Infusions: ? sodium chloride    ? sodium chloride 1 mL/kg/hr (09/13/21 0540)  ? ?PRN Meds: ?sodium chloride, acetaminophen, magnesium hydroxide, nitroGLYCERIN, ondansetron (ZOFRAN) IV, oxyCODONE-acetaminophen **AND** oxyCODONE, sodium chloride flush  ? ?Vital Signs  ?  ?Vitals:  ? 09/12/21 0514 09/12/21 1428 09/12/21 2055 09/13/21 0332  ?BP: 110/62 97/64  (!) 112/59  ?Pulse: 63 62  61  ?Resp: '18 17 16   '$ ?Temp: 97.6 ?F (36.4 ?C) 98.1 ?F (36.7 ?C) 97.6 ?F (36.4 ?C) 97.7 ?F (36.5 ?C)  ?TempSrc: Oral Oral Oral Oral  ?SpO2: 100% 95% 94% 95%  ?Weight:    131 kg  ?Height:      ? ?No intake or output data in the 24 hours ending 09/13/21 0809 ? ?  09/13/2021  ?  3:32 AM 09/11/2021  ?  6:22 PM 07/11/2021  ?  1:12 PM  ?Last 3 Weights  ?Weight (lbs) 288 lb 12.8 oz 300 lb 4.8 oz 310 lb 9.6 oz  ?Weight (kg) 130.999 kg 136.215 kg 140.887 kg  ?   ? ?Telemetry  ?  ?A sense, V paced, PVCs, HR 60s - Personally Reviewed ? ?ECG  ?  ?pending - Personally Reviewed ? ?Physical Dunlap  ? ?GEN: No acute distress.   ?Neck: No JVD ?Cardiac: RRR, no murmurs, rubs, or gallops.  ?Respiratory: minimal wheezing. ?GI: Soft, nontender, non-distended  ?MS: No edema; No  deformity. ?Neuro:  Nonfocal  ?Psych: Normal affect  ? ?Labs  ?  ?High Sensitivity Troponin:   ?Recent Labs  ?Lab 09/11/21 ?1305 09/11/21 ?1503 09/11/21 ?1659 09/11/21 ?1852  ?TROPONINIHS 18* 20* 20* 19*  ?   ?Chemistry ?Recent Labs  ?Lab 09/11/21 ?1305 09/11/21 ?1659 09/12/21 ?0458 09/13/21 ?3546  ?NA 134*  --  139 137  ?K 3.9  --  3.9 3.6  ?CL 105  --  108 104  ?CO2 20*  --  24 25  ?GLUCOSE 97  --  85 109*  ?BUN 13  --  13 16  ?CREATININE 1.39* 1.38* 1.34* 1.34*  ?CALCIUM 9.0  --  9.1 9.0  ?GFRNONAA 56* 57* 59* 59*  ?ANIONGAP 9  --  7 8  ?  ?Lipids  ?Recent Labs  ?Lab 09/12/21 ?5681  ?CHOL 115  ?TRIG 69  ?HDL 39*  ?[place] 62  ?CHOLHDL 2.9  ?  ?Hematology ?Recent Labs  ?Lab 09/11/21 ?1305 09/11/21 ?1659 09/13/21 ?2751  ?WBC 6.8 5.8 5.0  ?RBC 3.61* 3.32* 3.51*  ?HGB 11.0* 10.1* 10.6*  ?HCT 34.0* 31.7* 32.0*  ?MCV 94.2 95.5  91.2  ?MCH 30.5 30.4 30.2  ?MCHC 32.4 31.9 33.1  ?RDW 14.9 15.0 14.8  ?PLT 136* 102* 109*  ? ?Thyroid No results for input(s): TSH, FREET4 in the last 168 hours.  ?BNPNo results for input(s): BNP, PROBNP in the last 168 hours.  ?DDimer No results for input(s): DDIMER in the last 168 hours.  ? ?Radiology  ?  ?DG Chest 2 View ? ?Result Date: 09/11/2021 ?CLINICAL DATA:  Pt c/o central chest pain x 1 day. Hx of CHF, DM, HTN, NSTEMI. Pt is a former smoker. Dunlap: CHEST - 2 VIEW COMPARISON:  12/19/2014 FINDINGS: Cardiac silhouette is normal in size. No mediastinal or hilar masses or evidence of adenopathy. Left anterior chest wall sequential pacemaker is stable. Clear lungs.  No pleural effusion or pneumothorax. Skeletal structures are intact. IMPRESSION: No active cardiopulmonary disease. Electronically Signed   By: Lajean Manes M.D.   On: 09/11/2021 14:29  ? ?CT Angio Chest PE W and/or Wo Contrast ? ?Result Date: 09/11/2021 ?CLINICAL DATA:  PE suspected, chest pain Dunlap: CT ANGIOGRAPHY CHEST WITH CONTRAST TECHNIQUE: Multidetector CT imaging of the chest was performed using the standard protocol during  bolus administration of intravenous contrast. Multiplanar CT image reconstructions and MIPs were obtained to evaluate the vascular anatomy. RADIATION DOSE REDUCTION: This Dunlap was performed according to the departmental dose-optimization program which includes automated exposure control, adjustment of the mA and/or kV according to patient size and/or use of iterative reconstruction technique. CONTRAST:  120m OMNIPAQUE IOHEXOL 350 MG/ML SOLN COMPARISON:  None. FINDINGS: Cardiovascular: Satisfactory opacification of the pulmonary arteries to the segmental level. No evidence of pulmonary embolism. Cardiomegaly. Three-vessel coronary artery calcifications. Left atrial appendage occlusion device. No pericardial effusion. Aortic atherosclerosis. Aortic valve calcifications. Left chest multi lead pacer. Mediastinum/Nodes: No enlarged mediastinal, hilar, or axillary lymph nodes. Thyroid gland, trachea, and esophagus demonstrate no significant findings. Lungs/Pleura: Mosaic attenuation of the bilateral lung bases. No pleural effusion or pneumothorax. Upper Abdomen: No acute abnormality. Coarse, nodular, cirrhotic morphology of the liver partially imaged in the upper abdomen. Musculoskeletal: No chest wall abnormality. No acute osseous findings. Review of the MIP images confirms the above findings. IMPRESSION: 1. Negative examination for pulmonary embolism. 2. Mosaic attenuation of the airspaces in the bilateral lung bases, suggesting small airways disease. 3. Cardiomegaly and coronary artery disease. 4. Aortic valve calcifications. Correlate for echocardiographic evidence of aortic valve dysfunction. 5. Coarse, nodular, cirrhotic morphology of the liver. Aortic Atherosclerosis (ICD10-I70.0). Electronically Signed   By: ADelanna AhmadiM.D.   On: 09/11/2021 15:12  ? ?ECHOCARDIOGRAM COMPLETE ? ?Result Date: 09/12/2021 ?   ECHOCARDIOGRAM REPORT   Patient Name:   Peter Dunlap: 09/12/2021 Medical Rec #:  [id number]         Height:       72.0 in Accession #:    [id number]       Weight:       300.3 lb Date of Birth:  [birth year]        BSA:          2.532 m? Patient Age:    89years          BP:           110/62 mmHg Patient Gender: M                 HR:           71 bpm. Dunlap Location:  Inpatient Procedure: 2D Echo, Cardiac  Doppler, Color Doppler and Intracardiac            Opacification Agent Indications:    R07.9* Chest pain, unspecified  History:        Patient has prior history of Echocardiogram examinations, most                 recent 09/15/2015. CHF, Previous Myocardial Infarction; Risk                 Factors:Current Smoker, Sleep Apnea, Hypertension, Dyslipidemia                 and Diabetes.  Sonographer:    Bernadene Person RDCS Referring Phys: [phone] [facility]  Sonographer Comments: Suboptimal subcostal window and patient is morbidly obese. IMPRESSIONS  1. Left ventricular ejection fraction, by estimation, is 70 to 75%. The left ventricle has hyperdynamic function. The left ventricle has no regional wall motion abnormalities. There is severe concentric left ventricular hypertrophy. Left ventricular diastolic parameters are consistent with Grade I diastolic dysfunction (impaired relaxation). Elevated left atrial pressure.  2. There is a mild LVOT gradient due to LV outflow tract narrowing in the setting of severe LVH. There is no valvular SAM.  3. Right ventricular systolic function is normal. The right ventricular size is normal. There is normal pulmonary artery systolic pressure. The estimated right ventricular systolic pressure is 72.5 mmHg.  4. The mitral valve is grossly normal. Trivial mitral valve regurgitation.  5. The aortic valve is tricuspid. There is moderate calcification of the aortic valve. There is moderate thickening of the aortic valve. Aortic valve regurgitation is trivial. Aortic valve sclerosis/calcification is present, without any evidence of aortic stenosis.  6. Aortic dilatation noted. There  is borderline dilatation of the ascending aorta, measuring 36 mm. Comparison(s): Compared to prior TTE in 2016, there is no significant change. FINDINGS  Left Ventricle: Left ventricular ejection fraction, by estimati

## 2021-09-13 NOTE — Interval H&P Note (Signed)
History and Physical Interval Note: ? ?09/13/2021 ?10:30 AM ? ?[name]  has presented today for surgery, with the diagnosis of unstable angina.  The various methods of treatment have been discussed with the patient and family. After consideration of risks, benefits and other options for treatment, the patient has consented to  Procedure(s): ?LEFT HEART CATH AND CORONARY ANGIOGRAPHY (N/A) as a surgical intervention.  The patient's history has been reviewed, patient examined, no change in status, stable for surgery.  I have reviewed the patient's chart and labs.  Questions were answered to the patient's satisfaction.   ? ?Cath Lab Visit (complete for each Cath Lab visit) ? ?Clinical Evaluation Leading to the Procedure:  ? ?ACS: Yes.   ? ?Non-ACS:   ? ?Anginal Classification: CCS III ? ?Anti-ischemic medical therapy: Minimal Therapy (1 class of medications) ? ?Non-Invasive Test Results: No non-invasive testing performed ? ?Prior CABG: No previous CABG ? ? ? ? ? ? ? ?[place] ? ? ?

## 2021-09-14 ENCOUNTER — Other Ambulatory Visit (HOSPITAL_COMMUNITY): Payer: Self-pay

## 2021-09-14 ENCOUNTER — Other Ambulatory Visit: Payer: Self-pay | Admitting: Physician Assistant

## 2021-09-14 DIAGNOSIS — E876 Hypokalemia: Secondary | ICD-10-CM

## 2021-09-14 LAB — BASIC METABOLIC PANEL
Anion gap: 12 (ref 5–15)
BUN: 18 mg/dL (ref 8–23)
CO2: 23 mmol/L (ref 22–32)
Calcium: 9.2 mg/dL (ref 8.9–10.3)
Chloride: 103 mmol/L (ref 98–111)
Creatinine, Ser: 1.47 mg/dL — ABNORMAL HIGH (ref 0.61–1.24)
GFR, Estimated: 53 mL/min — ABNORMAL LOW (ref >60)
Glucose, Bld: 186 mg/dL — ABNORMAL HIGH (ref 70–99)
Potassium: 3.2 mmol/L — ABNORMAL LOW (ref 3.5–5.1)
Sodium: 138 mmol/L (ref 135–145)

## 2021-09-14 LAB — CBC WITH DIFFERENTIAL/PLATELET
Abs Immature Granulocytes: 0.02 10*3/uL (ref 0.00–0.07)
Basophils Absolute: 0.1 10*3/uL (ref 0.0–0.1)
Basophils Relative: 1 %
Eosinophils Absolute: 0.1 10*3/uL (ref 0.0–0.5)
Eosinophils Relative: 2 %
HCT: 33.5 % — ABNORMAL LOW (ref 39.0–52.0)
Hemoglobin: 11 g/dL — ABNORMAL LOW (ref 13.0–17.0)
Immature Granulocytes: 0 %
Lymphocytes Relative: 26 %
Lymphs Abs: 1.4 10*3/uL (ref 0.7–4.0)
MCH: 29.8 pg (ref 26.0–34.0)
MCHC: 32.8 g/dL (ref 30.0–36.0)
MCV: 90.8 fL (ref 80.0–100.0)
Monocytes Absolute: 0.6 10*3/uL (ref 0.1–1.0)
Monocytes Relative: 10 %
Neutro Abs: 3.3 10*3/uL (ref 1.7–7.7)
Neutrophils Relative %: 61 %
Platelets: 134 10*3/uL — ABNORMAL LOW (ref 150–400)
RBC: 3.69 MIL/uL — ABNORMAL LOW (ref 4.22–5.81)
RDW: 14.8 % (ref 11.5–15.5)
WBC: 5.4 10*3/uL (ref 4.0–10.5)
nRBC: 0 % (ref 0.0–0.2)

## 2021-09-14 LAB — GLUCOSE, CAPILLARY
Glucose-Capillary: 210 mg/dL — ABNORMAL HIGH (ref 70–99)
Glucose-Capillary: 128 mg/dL — ABNORMAL HIGH (ref 70–99)

## 2021-09-14 LAB — MAGNESIUM: Magnesium: 2.2 mg/dL (ref 1.7–2.4)

## 2021-09-14 MED ORDER — SOTALOL HCL 80 MG PO TABS
80.0000 mg | ORAL_TABLET | Freq: Two times a day (BID) | ORAL | Status: DC
Start: 2021-09-14 — End: 2021-09-14
  Administered 2021-09-14: 80 mg via ORAL
  Filled 2021-09-14 (×2): qty 1

## 2021-09-14 MED ORDER — SOTALOL HCL 80 MG PO TABS: 80.0000 mg | ORAL_TABLET | Freq: Two times a day (BID) | ORAL | 3 refills | Status: DC

## 2021-09-14 MED ORDER — POTASSIUM CHLORIDE CRYS ER 20 MEQ PO TBCR: 40.0000 meq | EXTENDED_RELEASE_TABLET | Freq: Every day | ORAL | Status: DC

## 2021-09-14 MED ORDER — SOTALOL HCL 80 MG PO TABS
80.0000 mg | ORAL_TABLET | Freq: Two times a day (BID) | ORAL | 2 refills | Status: DC
Start: 2021-09-14 — End: 2021-09-14
  Filled 2021-09-14: qty 90, 30d supply, fill #0

## 2021-09-14 MED ORDER — DOCUSATE SODIUM 100 MG PO CAPS
100.0000 mg | ORAL_CAPSULE | Freq: Every day | ORAL | Status: DC | PRN
  Administered 2021-09-14: 100 mg via ORAL
  Filled 2021-09-14: qty 1

## 2021-09-14 MED ORDER — POTASSIUM CHLORIDE CRYS ER 20 MEQ PO TBCR
60.0000 meq | EXTENDED_RELEASE_TABLET | Freq: Once | ORAL | Status: AC
  Administered 2021-09-14: 60 meq via ORAL
  Filled 2021-09-14: qty 3

## 2021-09-14 MED ORDER — POTASSIUM CHLORIDE CRYS ER 20 MEQ PO TBCR: 40.0000 meq | EXTENDED_RELEASE_TABLET | Freq: Every day | ORAL | 3 refills | Status: DC

## 2021-09-14 MED ORDER — POTASSIUM CHLORIDE CRYS ER 20 MEQ PO TBCR
40.0000 meq | EXTENDED_RELEASE_TABLET | Freq: Every day | ORAL | 3 refills | Status: DC
  Filled 2021-09-14: qty 30, 15d supply, fill #0

## 2021-09-14 MED ORDER — SOTALOL HCL 80 MG PO TABS
80.0000 mg | ORAL_TABLET | Freq: Two times a day (BID) | ORAL | 3 refills | Status: DC
  Filled 2021-09-14: qty 180, 90d supply, fill #0

## 2021-09-14 NOTE — Progress Notes (Signed)
Mobility Specialist Progress Note  ? ? 09/14/21 1553  ?Mobility  ?Activity Ambulated independently in hallway  ?Level of Assistance Independent  ?Assistive Device None  ?Distance Ambulated (ft) 200 ft  ?Activity Response Tolerated fair  ?$Mobility charge 1 Mobility  ? ?Pre-Mobility: 63 HR, 115/71 BP ?During Mobility: 71 HR ?Post-Mobility: 67 HR, 106/67 BP ? ?Pt received in bed and agreeable. C/o feeling lightheaded during walk. Returned to sitting in bed with call bell in reach.  ? ?Hildred Alamin ?Mobility Specialist  ?  ?

## 2021-09-14 NOTE — Discharge Summary (Addendum)
?Discharge Summary  ?  ?Patient ID: Peter Dunlap ?MRN: [id number]; DOB: [birth year] ? ?Admit date: 09/11/2021 ?Discharge date: 09/14/2021 ? ?PCP:  Dulce Sellar, MD ?  ?[facility] Providers ?Cardiologist:  [name], MD   { ? ?Discharge Diagnoses  ?  ?Principal Problem: ?  Chest pain at rest ?Active Problems: ?  PSVT (paroxysmal supraventricular tachycardia) ([place]) ?  CAD (coronary artery disease) ?  Mixed hyperlipidemia ?  Hypertrophic cardiomyopathy ([place]) ?  Chronic diastolic heart failure ([place]) ?  Tachy-brady syndrome ([place]) ?  Pacemaker-[employer] Jude ? ? ? ?Diagnostic Studies/Procedures  ?  ?LHC 09/13/21 ?  ?  Prox RCA lesion is 40% stenosed. ?  RPAV lesion is 40% stenosed. ?  RPDA lesion is 50% stenosed. ?  2nd Mrg lesion is 10% stenosed. ?  Mid LAD lesion is 10% stenosed. ?  LV end diastolic pressure is low. ?  There is moderate aortic valve stenosis. ?  ?1.  Patent mid LAD and first obtuse marginal stent with mild to moderate disease elsewhere.  Compared with the 2016 study there has been no change in overall burden of disease. ?2.  Low LVEDP of 3 mmHg. ?3.  Mean aortic valve gradient of 20 mmHg. ?  ?The results were reviewed with Dr. Margaretann Loveless and medical management will be pursued. ? ?Coronary Diagrams ? ?Diagnostic ?Dominance: Left ? ? ? ?Echo 09/12/21 ? 1. Left ventricular ejection fraction, by estimation, is 70 to 75%. The  ?left ventricle has hyperdynamic function. The left ventricle has no  ?regional wall motion abnormalities. There is severe concentric left  ?ventricular hypertrophy. Left ventricular  ?diastolic parameters are consistent with Grade I diastolic dysfunction  ?(impaired relaxation). Elevated left atrial pressure.  ? 2. There is a mild LVOT gradient due to LV outflow tract narrowing in the  ?setting of severe LVH. There is no valvular SAM.  ? 3. Right ventricular systolic function is normal. The right ventricular  ?size is normal. There is normal pulmonary artery systolic pressure. The   ?estimated right ventricular systolic pressure is 34.1 mmHg.  ? 4. The mitral valve is grossly normal. Trivial mitral valve  ?regurgitation.  ? 5. The aortic valve is tricuspid. There is moderate calcification of the  ?aortic valve. There is moderate thickening of the aortic valve. Aortic  ?valve regurgitation is trivial. Aortic valve sclerosis/calcification is  ?present, without any evidence of  ?aortic stenosis.  ? 6. Aortic dilatation noted. There is borderline dilatation of the  ?ascending aorta, measuring 36 mm.  ? ?Comparison(s): Compared to prior TTE in 2016, there is no significant  ?change.  ?_____________ ?  ?History of Present Illness   ?  ?Peter Dunlap is a 66 y.o. 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  ? ?Hospital Course  ?   ?Consultants: None ? ?In the ER blood pressures were normal, O2 normal on RA. HS trop 18>20. Labs showed mild AKI and chronic anemia Hgb 11-12. CT chest was negative for PE. The patient was admitted for further work-up and cardiac catheterization.  ? ?Progressive stable angina ?NSTEMI ?CAD with prior stenting  (PCI LAD/OM last stent with MI in 2015) ?- presented 4/9 with  progressive angina requiring NTG ?- HS troponin mildly elevated ?- Echo showed LVEF 70-75%, severe LVH, G1DD, elevated left atrial pressure, mild LVOT gradient, normal RV function, trivial MR, aortic dilation 87m ?- LHC showed patent mid LAD and first obtuse marginal stent with mild to moderate disease elsewhere, overall no change from prior study, mean aortic gradient of 224mg ?- plan to continue management with ASA '325mg'$  given he is s/p Watchman ?- continue PTA  Crestor '40mg'$  daily and Imdur '60mg'$  daily ? ?H/o Afib s/p Watchman ?S/p pSVT ablation ?Tachybrady syndrome s/p DC Abbott PPM SR ?- PTA sotalol 160/80 ?- QT noted to be long, discussed with EP and sotalol was decreased, plan to send out on sotalol '80mg'$  BID ?- Despite Qtc reading as >50073msuspected Qtc actually around 480m19m- he has follow-up with EP ? ?HFpEF ?- remained euvolemic throughout admission ?- continue home lasix dose ? ?The patient was evaluated by Dr. AchaMargaretann Loveless4/12/23 and was felt to be stable for discharge.  ? ?GEN: No acute distress.   ?Neck: No JVD ?Cardiac: RRR, no murmurs, rubs, or gallops.  ?Respiratory: minimal wheezing. ?GI: Soft, nontender, non-distended  ?MS: No edema; No deformity. ?Neuro:  Nonfocal  ?Psych: Normal affect  ? ?Did the patient have an acute coronary syndrome (MI, NSTEMI, STEMI, etc) this admission?:  No                               ?Did the patient have a percutaneous coronary intervention (stent / angioplasty)?:  No   ? ? ?Cath/PCI Registry Performance & Quality Measures: ?Aspirin prescribed? - Yes ?ADP Receptor Inhibitor (Plavix/Clopidogrel, Brilinta/Ticagrelor or Effient/Prasugrel) prescribed (includes medically managed patients)? - No - s/p Watchman on ASA '325mg'$  daily ?High Intensity Statin (Lipitor 40-'80mg'$  or Crestor 20-'40mg'$ ) prescribed? - Yes ?For EF <40%, was ACEI/ARB prescribed? - Not Applicable (EF >/= 40%)29%or EF <40%, Aldosterone Antagonist (Spironolactone or Eplerenone) prescribed? - Not Applicable (EF >/= 40%)93%ardiac Rehab Phase II ordered? - Yes  ? ?   ? ?_____________ ? ?Discharge Vitals ?Blood pressure 98/71, pulse 65, temperature 98 ?F (36.7 ?C), temperature source Oral, resp. rate 16, height 6' (1.829 m), weight 131 kg, SpO2 99 %.  ?Filed Weights  ? 09/11/21 1822 09/13/21 0332  ?Weight: (!) 136.2 kg 131 kg  ? ? ?Labs & Radiologic Studies  ?  ?CBC ?Recent Labs  ?  09/13/21 ?0717 09/14/21 ?0840  ?WBC 5.0 5.4  ?NEUTROABS 2.6 3.3  ?HGB 10.6* 11.0*  ?HCT  32.0* 33.5*  ?MCV 91.2 90.8  ?PLT 109* 134*  ? ?Basic Metabolic Panel ?Recent Labs  ?  09/13/21 ?0717 09/14/21 ?0840  ?NA 137 138  ?K 3.6 3.2*  ?CL 104 103  ?CO2 25 23  ?GLUCOSE 109* 186*  ?BUN 16 18  ?CREATININE 1.34* 1.47*  ?CALCIUM 9.0 9.2  ?MG  --  2.2  ? ?Liver Function Tests ?No results for input(s): AST, ALT, ALKPHOS, BILITOT, PROT, ALBUMIN in the last 72 hours. ?No results for input(s): LIPASE, AMYLASE in the last 72 hours. ?High Sensitivity Troponin:   ?Recent Labs  ?Lab 09/11/21 ?1305 09/11/21 ?1503 09/11/21 ?1659 09/11/21 ?1852  ?TROPONINIHS 18* 20* 20* 19*  ?  ?BNP ?Invalid input(s): POCBNP ?D-Dimer ?No results for input(s): DDIMER in the last 72 hours. ?Hemoglobin A1C ?No results for input(s): HGBA1C in the last 72 hours. ?Fasting Lipid Panel ?Recent Labs  ?  09/12/21 ?0458  ?CHOL 115  ?HDL 39*  ?  [place] 62  ?TRIG 69  ?CHOLHDL 2.9  ? ?Thyroid Function Tests ?No results for input(s): TSH, T4TOTAL, T3FREE, THYROIDAB in the last 72 hours. ? ?Invalid input(s): FREET3 ?_____________  ?DG Chest 2 View ? ?Result Date: 09/11/2021 ?CLINICAL DATA:  Pt c/o central chest pain x 1 day. Hx of CHF, DM, HTN, NSTEMI. Pt is a former smoker. EXAM: CHEST - 2 VIEW COMPARISON:  12/19/2014 FINDINGS: Cardiac silhouette is normal in size. No mediastinal or hilar masses or evidence of adenopathy. Left anterior chest wall sequential pacemaker is stable. Clear lungs.  No pleural effusion or pneumothorax. Skeletal structures are intact. IMPRESSION: No active cardiopulmonary disease. Electronically Signed   By: Lajean Manes M.D.   On: 09/11/2021 14:29  ? ?CT HEAD WO CONTRAST (5MM) ? ?Result Date: 09/13/2021 ?CLINICAL DATA:  Subdural hematoma. History of subdural hematoma (last imaging in 2015). Going for catheterization with possible percutaneous intervention today. Requesting interval imaging. EXAM: CT HEAD WITHOUT CONTRAST TECHNIQUE: Contiguous axial images were obtained from the base of the skull through the vertex without  intravenous contrast. RADIATION DOSE REDUCTION: This exam was performed according to the departmental dose-optimization program which includes automated exposure control, adjustment of the mA and/or kV according to patie

## 2021-09-15 ENCOUNTER — Other Ambulatory Visit (HOSPITAL_COMMUNITY): Payer: Self-pay

## 2021-09-16 ENCOUNTER — Other Ambulatory Visit: Payer: Medicare Other | Admitting: *Deleted

## 2021-09-16 ENCOUNTER — Other Ambulatory Visit: Payer: Self-pay | Admitting: *Deleted

## 2021-09-16 DIAGNOSIS — E876 Hypokalemia: Secondary | ICD-10-CM

## 2021-09-16 LAB — BASIC METABOLIC PANEL
BUN/Creatinine Ratio: 14 (ref 10–24)
BUN: 19 mg/dL (ref 8–27)
CO2: 24 mmol/L (ref 20–29)
Calcium: 9.4 mg/dL (ref 8.6–10.2)
Chloride: 101 mmol/L (ref 96–106)
Creatinine, Ser: 1.36 mg/dL — ABNORMAL HIGH (ref 0.76–1.27)
Glucose: 136 mg/dL — ABNORMAL HIGH (ref 70–99)
Potassium: 4.9 mmol/L (ref 3.5–5.2)
Sodium: 138 mmol/L (ref 134–144)
eGFR: 58 mL/min/{1.73_m2} — ABNORMAL LOW (ref >59)

## 2021-09-20 ENCOUNTER — Inpatient Hospital Stay (HOSPITAL_COMMUNITY): Payer: Medicare Other

## 2021-09-20 ENCOUNTER — Inpatient Hospital Stay (HOSPITAL_COMMUNITY): Payer: Medicare Other | Attending: Hematology

## 2021-09-20 ENCOUNTER — Inpatient Hospital Stay (HOSPITAL_BASED_OUTPATIENT_CLINIC_OR_DEPARTMENT_OTHER): Payer: Medicare Other | Admitting: Hematology

## 2021-09-20 VITALS — BP 137/84 | HR 65 | Temp 97.3°F | Resp 20 | Ht 70.87 in | Wt 297.6 lb

## 2021-09-20 DIAGNOSIS — I2510 Atherosclerotic heart disease of native coronary artery without angina pectoris: Secondary | ICD-10-CM | POA: Diagnosis not present

## 2021-09-20 DIAGNOSIS — Z79899 Other long term (current) drug therapy: Secondary | ICD-10-CM | POA: Diagnosis not present

## 2021-09-20 DIAGNOSIS — I700 Atherosclerosis of aorta: Secondary | ICD-10-CM | POA: Insufficient documentation

## 2021-09-20 DIAGNOSIS — M549 Dorsalgia, unspecified: Secondary | ICD-10-CM | POA: Diagnosis not present

## 2021-09-20 DIAGNOSIS — Z800 Family history of malignant neoplasm of digestive organs: Secondary | ICD-10-CM | POA: Diagnosis not present

## 2021-09-20 DIAGNOSIS — Z8249 Family history of ischemic heart disease and other diseases of the circulatory system: Secondary | ICD-10-CM | POA: Diagnosis not present

## 2021-09-20 DIAGNOSIS — R109 Unspecified abdominal pain: Secondary | ICD-10-CM | POA: Diagnosis not present

## 2021-09-20 DIAGNOSIS — D649 Anemia, unspecified: Secondary | ICD-10-CM | POA: Insufficient documentation

## 2021-09-20 DIAGNOSIS — Z8041 Family history of malignant neoplasm of ovary: Secondary | ICD-10-CM | POA: Diagnosis not present

## 2021-09-20 DIAGNOSIS — C778 Secondary and unspecified malignant neoplasm of lymph nodes of multiple regions: Secondary | ICD-10-CM | POA: Insufficient documentation

## 2021-09-20 DIAGNOSIS — Z191 Hormone sensitive malignancy status: Secondary | ICD-10-CM | POA: Diagnosis not present

## 2021-09-20 DIAGNOSIS — E785 Hyperlipidemia, unspecified: Secondary | ICD-10-CM | POA: Insufficient documentation

## 2021-09-20 DIAGNOSIS — R079 Chest pain, unspecified: Secondary | ICD-10-CM | POA: Insufficient documentation

## 2021-09-20 DIAGNOSIS — K5900 Constipation, unspecified: Secondary | ICD-10-CM | POA: Insufficient documentation

## 2021-09-20 DIAGNOSIS — C61 Malignant neoplasm of prostate: Secondary | ICD-10-CM

## 2021-09-20 DIAGNOSIS — E039 Hypothyroidism, unspecified: Secondary | ICD-10-CM | POA: Diagnosis not present

## 2021-09-20 DIAGNOSIS — Z806 Family history of leukemia: Secondary | ICD-10-CM | POA: Insufficient documentation

## 2021-09-20 DIAGNOSIS — Z803 Family history of malignant neoplasm of breast: Secondary | ICD-10-CM | POA: Insufficient documentation

## 2021-09-20 DIAGNOSIS — E559 Vitamin D deficiency, unspecified: Secondary | ICD-10-CM | POA: Insufficient documentation

## 2021-09-20 DIAGNOSIS — Z808 Family history of malignant neoplasm of other organs or systems: Secondary | ICD-10-CM | POA: Diagnosis not present

## 2021-09-20 DIAGNOSIS — F1290 Cannabis use, unspecified, uncomplicated: Secondary | ICD-10-CM | POA: Insufficient documentation

## 2021-09-20 DIAGNOSIS — R5383 Other fatigue: Secondary | ICD-10-CM | POA: Diagnosis not present

## 2021-09-20 DIAGNOSIS — Z87891 Personal history of nicotine dependence: Secondary | ICD-10-CM | POA: Diagnosis not present

## 2021-09-20 DIAGNOSIS — R350 Frequency of micturition: Secondary | ICD-10-CM | POA: Insufficient documentation

## 2021-09-20 DIAGNOSIS — Z801 Family history of malignant neoplasm of trachea, bronchus and lung: Secondary | ICD-10-CM | POA: Insufficient documentation

## 2021-09-20 DIAGNOSIS — K7460 Unspecified cirrhosis of liver: Secondary | ICD-10-CM | POA: Insufficient documentation

## 2021-09-20 DIAGNOSIS — Z833 Family history of diabetes mellitus: Secondary | ICD-10-CM | POA: Insufficient documentation

## 2021-09-20 LAB — COMPREHENSIVE METABOLIC PANEL
ALT: 10 U/L (ref 0–44)
AST: 18 U/L (ref 15–41)
Albumin: 3.6 g/dL (ref 3.5–5.0)
Alkaline Phosphatase: 76 U/L (ref 38–126)
Anion gap: 8 (ref 5–15)
BUN: 19 mg/dL (ref 8–23)
CO2: 22 mmol/L (ref 22–32)
Calcium: 9.2 mg/dL (ref 8.9–10.3)
Chloride: 107 mmol/L (ref 98–111)
Creatinine, Ser: 1.3 mg/dL — ABNORMAL HIGH (ref 0.61–1.24)
GFR, Estimated: >60 mL/min (ref >60)
Glucose, Bld: 125 mg/dL — ABNORMAL HIGH (ref 70–99)
Potassium: 4.8 mmol/L (ref 3.5–5.1)
Sodium: 137 mmol/L (ref 135–145)
Total Bilirubin: 0.3 mg/dL (ref 0.3–1.2)
Total Protein: 7.4 g/dL (ref 6.5–8.1)

## 2021-09-20 LAB — CBC WITH DIFFERENTIAL/PLATELET
Abs Immature Granulocytes: 0.01 10*3/uL (ref 0.00–0.07)
Basophils Absolute: 0.1 10*3/uL (ref 0.0–0.1)
Basophils Relative: 1 %
Eosinophils Absolute: 0.2 10*3/uL (ref 0.0–0.5)
Eosinophils Relative: 3 %
HCT: 34.2 % — ABNORMAL LOW (ref 39.0–52.0)
Hemoglobin: 10.5 g/dL — ABNORMAL LOW (ref 13.0–17.0)
Immature Granulocytes: 0 %
Lymphocytes Relative: 30 %
Lymphs Abs: 1.7 10*3/uL (ref 0.7–4.0)
MCH: 28.9 pg (ref 26.0–34.0)
MCHC: 30.7 g/dL (ref 30.0–36.0)
MCV: 94.2 fL (ref 80.0–100.0)
Monocytes Absolute: 0.6 10*3/uL (ref 0.1–1.0)
Monocytes Relative: 11 %
Neutro Abs: 3.1 10*3/uL (ref 1.7–7.7)
Neutrophils Relative %: 55 %
Platelets: 145 10*3/uL — ABNORMAL LOW (ref 150–400)
RBC: 3.63 MIL/uL — ABNORMAL LOW (ref 4.22–5.81)
RDW: 14.8 % (ref 11.5–15.5)
WBC: 5.7 10*3/uL (ref 4.0–10.5)
nRBC: 0 % (ref 0.0–0.2)

## 2021-09-20 LAB — PSA: Prostatic Specific Antigen: 0.01 ng/mL (ref 0.00–4.00)

## 2021-09-20 MED ORDER — LACTULOSE 20 GM/30ML PO SOLN: 20.0000 g | Freq: Every day | ORAL | 3 refills | Status: DC | PRN

## 2021-09-20 MED ORDER — DEGARELIX ACETATE 80 MG ~~LOC~~ SOLR
80.0000 mg | Freq: Once | SUBCUTANEOUS | Status: AC
  Administered 2021-09-20: 80 mg via SUBCUTANEOUS
  Filled 2021-09-20: qty 4

## 2021-09-20 NOTE — Progress Notes (Signed)
Patient is taking Erleada as prescribed.  He has not missed any doses and reports no side effects at this time.   

## 2021-09-20 NOTE — Patient Instructions (Signed)
[facility]  Discharge Instructions: ?Thank you for choosing [facility] to provide your oncology and hematology care.  ?If you have a lab appointment with the [facility], please come in thru the Main Entrance and check in at the main information desk. ? ?Wear comfortable clothing and clothing appropriate for easy access to any Portacath or PICC line.  ? ?We strive to give you quality time with your provider. You may need to reschedule your appointment if you arrive late (15 or more minutes).  Arriving late affects you and other patients whose appointments are after yours.  Also, if you miss three or more appointments without notifying the office, you may be dismissed from the clinic at the provider?s discretion.    ?  ?For prescription refill requests, have your pharmacy contact our office and allow 72 hours for refills to be completed.   ? ?Today you received the following chemotherapy and/or immunotherapy agents [place] [facility]    ?  ?To help prevent nausea and vomiting after your treatment, we encourage you to take your nausea medication as directed. ? ?BELOW ARE SYMPTOMS THAT SHOULD BE REPORTED IMMEDIATELY: ?*FEVER GREATER THAN 100.4 F (38 ?C) OR HIGHER ?*CHILLS OR SWEATING ?*NAUSEA AND VOMITING THAT IS NOT CONTROLLED WITH YOUR NAUSEA MEDICATION ?*UNUSUAL SHORTNESS OF BREATH ?*UNUSUAL BRUISING OR BLEEDING ?*URINARY PROBLEMS (pain or burning when urinating, or frequent urination) ?*BOWEL PROBLEMS (unusual diarrhea, constipation, pain near the anus) ?TENDERNESS IN MOUTH AND THROAT WITH OR WITHOUT PRESENCE OF ULCERS (sore throat, sores in mouth, or a toothache) ?UNUSUAL RASH, SWELLING OR PAIN  ?UNUSUAL VAGINAL DISCHARGE OR ITCHING  ? ?Items with * indicate a potential emergency and should be followed up as soon as possible or go to the Emergency Department if any problems should occur. ? ?Please show the CHEMOTHERAPY ALERT CARD or IMMUNOTHERAPY ALERT CARD at check-in to the Emergency  Department and triage nurse. ? ?Should you have questions after your visit or need to cancel or reschedule your appointment, please contact [facility] [phone]  and follow the prompts.  Office hours are 8:00 a.m. to 4:30 p.m. Monday - Friday. Please note that voicemails left after 4:00 p.m. may not be returned until the following business day.  We are closed weekends and major holidays. You have access to a nurse at all times for urgent questions. Please call the main number to the clinic [phone] and follow the prompts. ? ?For any non-urgent questions, you may also contact your provider using MyChart. We now offer e-Visits for anyone 69 and older to request care online for non-urgent symptoms. For details visit [email or web address]. ?  ?Also download the MyChart app! Go to the app store, search "MyChart", open the app, select [facility], and log in with your MyChart username and password. ? ?Due to Covid, a mask is required upon entering the hospital/clinic. If you do not have a mask, one will be given to you upon arrival. For doctor visits, patients may have 1 support person aged 48 or older with them. For treatment visits, patients cannot have anyone with them due to current Covid guidelines and our immunocompromised population.  ?

## 2021-09-20 NOTE — Progress Notes (Signed)
? ?[facility] ?[address] ?[place], [place] [address] ? ? ?CLINIC:  ?Medical Oncology/Hematology ? ?PCP:  ?Dulce Sellar, MD ?[address] / [place] [address] ?[phone] ? ? 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 ?PRIOR THERAPY: IMRT x 40 sessions completed on 06/14/2016 ? ?NGS Results: not done ? ?CURRENT THERAPY: Firmagon every month; Erleada 240 mg daily ?  ?BRIEF ONCOLOGIC HISTORY:  ?Oncology History  ?Prostate cancer ([place])  ?03/10/2016 Initial Diagnosis  ? Prostate cancer [facility]) ?  ?05/23/2020 Genetic Testing  ? Negative genetic testing:  No pathogenic variants detected on the [facility] Common Hereditary Cancers Panel + Prostate Cancer HRR Panel. A variant of uncertain significance (VUS) was detected in the NF1 gene called c.1178A>G. The report date is 05/23/2020. ? ?The Common Hereditary Cancers Panel offered by [facility] includes sequencing and/or deletion duplication testing of the following 47 genes: APC, ATM, AXIN2, BARD1, BMPR1A, BRCA1, BRCA2, BRIP1, CDH1, CDK4, CDKN2A (p14ARF), CDKN2A (p16INK4a), CHEK2, CTNNA1, DICER1, EPCAM (Deletion/duplication testing only), GREM1 (promoter region deletion/duplication testing only), KIT, MEN1, MLH1, MSH2, MSH3, MSH6, MUTYH, NBN, NF1, NTHL1, PALB2, PDGFRA, PMS2, POLD1, POLE, PTEN, RAD50, RAD51C, RAD51D, SDHB, SDHC, SDHD, SMAD4, SMARCA4. STK11, TP53, TSC1, TSC2, and VHL.  The following genes were evaluated for sequence changes only: SDHA and HOXB13 c.251G>A variant only. The Prostate Cancer HRR Panel offered by [facility] includes sequencing and/or deletion duplication analysis of the following 10 genes: ATM, BARD1, BRCA1, BRCA2, BRIP1, CHEK2, FANCL, PALB2, RAD51C, RAD51D. ?  ? ? ?CANCER STAGING: ?Cancer Staging  ?No matching staging information was found for the patient. ? ?INTERVAL HISTORY:  ?[name], a 66 y.o. male, returns for routine follow-up of his metastatic castration sensitive  prostate cancer. Peter Dunlap was last seen on 06/20/2021.  ? ?Today he reports feeling good. He had CP last week which has since resolved. He is taking Erleada and tolerating it well. He reports constipation which was not helped by Miralax. His last BM was 2-3 days ago.  ? ?REVIEW OF SYSTEMS:  ?Review of Systems  ?Constitutional:  Positive for fatigue. Negative for appetite change.  ?Cardiovascular:  Positive for chest pain.  ?Gastrointestinal:  Positive for abdominal pain and constipation.  ?Genitourinary:  Positive for frequency.   ?Musculoskeletal:  Positive for back pain.  ?All other systems reviewed and are negative. ? ?PAST MEDICAL/SURGICAL HISTORY:  ?Past Medical History:  ?Diagnosis Date  ? Arteriosclerotic cardiovascular disease (ASCVD) 2005  ? catheterization in 10/2010:50% mid LAD, diffuse distal disease, circumflex irregularities, large dominant RCA with a 50% ostial, 70% distal, 60% posterolateral and 70% PDA; normal EF  ? Arthritis   ? Benign prostatic hypertrophy   ? Bilateral carpal tunnel syndrome 07/03/2018  ? Cerebrovascular disease 2010  ? R. carotid endarterectomy; Duplex in 10/2010-widely patent ICAs, subtotal left vertebral-not thought to be contributing to symptoms  ? Cervical spine disease   ? CT in 2012-advanced degeneration and spondylosis with moderate spinal stenosis at C3-C6  ? CHF (congestive heart failure) ([place])   ? Depression   ? Diabetes mellitus without complication ([place])   ? Erectile dysfunction   ? Family history of breast cancer   ? Family history of cancer of mouth   ? Family history of CML (chronic monocytic leukemia)   ? Family history of lung cancer   ? Family history of ovarian cancer   ? Family history of stomach cancer   ? Gastroesophageal reflux disease   ? H/O hiatal hernia   ?  H/O: substance abuse ([place])   ? Cocaine, marijuana, alcohol.  Quit 2013.   ? Hyperlipidemia   ? Hypertension   ? Non-ST elevation myocardial infarction (NSTEMI), initial episode of care [facility]) 12/02/2013   ? DES LAD  ? Obesity   ? Prostate cancer ([place])   ? Sleep apnea   ? CPAP  ? Tachy-brady syndrome ([place])   ? a. s/p STJ dual chamber PPM   ? Thyroid disease   ? Tobacco abuse   ? Quit 2014  ? 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 Bilateral  ? ?Past Surgical History:  ?Procedure Laterality Date  ? BRAIN SURGERY  2015  ? hematoma evacuation  ? BURR HOLE Right 04/13/2014  ? Procedure: [facility] Flirt;  Surgeon: Charlie Pitter, MD;  Location: [facility];  Service: Neurosurgery;  Laterality: Right;  ? CAROTID ENDARTERECTOMY Right Feb. 25, 2010  ?  CEA  ? COLONOSCOPY WITH PROPOFOL N/A 06/24/2021  ? Procedure: COLONOSCOPY WITH PROPOFOL;  Surgeon: Carol [place], MD;  Location: [facility] ENDOSCOPY;  Service: Endoscopy;  Laterality: N/A;  ? CORONARY ANGIOPLASTY WITH STENT PLACEMENT  12/03/2013  ? LAD 90%-->0% W/ Promus Premier DES 3.0 mm x 16 mm, CFX OK, RCA 40%, EF 70-75%  ? LEFT ATRIAL APPENDAGE OCCLUSION N/A 08/05/2015  ? Procedure: LEFT ATRIAL APPENDAGE OCCLUSION;  Surgeon: [name], MD;  Location: [facility] CV LAB;  Service: Cardiovascular;  Laterality: N/A;  ? LEFT HEART CATH AND CORONARY ANGIOGRAPHY N/A 09/13/2021  ? Procedure: LEFT HEART CATH AND CORONARY ANGIOGRAPHY;  Surgeon: Early Osmond, MD;  Location: [facility] CV LAB;  Service: Cardiovascular;  Laterality: N/A;  ? LEFT HEART CATHETERIZATION WITH CORONARY ANGIOGRAM Left 12/03/2013  ? Procedure: LEFT HEART CATHETERIZATION WITH CORONARY ANGIOGRAM;  Surgeon: Leonie Man, MD;  Location: [facility] CATH LAB;  Service: Cardiovascular;  Laterality: Left;  ? LEFT HEART CATHETERIZATION WITH CORONARY ANGIOGRAM N/A 01/26/2014  ? Procedure: LEFT HEART CATHETERIZATION WITH CORONARY ANGIOGRAM;  Surgeon: Jettie Booze, MD;  Location: [facility] CATH LAB;  Service: Cardiovascular;  Laterality: N/A;  ? LEFT HEART CATHETERIZATION WITH CORONARY ANGIOGRAM N/A 08/03/2014  ? Procedure: LEFT HEART CATHETERIZATION WITH CORONARY ANGIOGRAM;  Surgeon: Burnell Blanks, MD;  Location: [facility] CATH LAB;   Service: Cardiovascular;  Laterality: N/A;  ? PERCUTANEOUS CORONARY STENT INTERVENTION (PCI-S)  12/03/2013  ? Procedure: PERCUTANEOUS CORONARY STENT INTERVENTION (PCI-S);  Surgeon: Leonie Man, MD;  Location: [facility] CATH LAB;  Service: Cardiovascular;;  ? PERMANENT PACEMAKER INSERTION N/A 09/18/2014  ? Procedure: PERMANENT PACEMAKER INSERTION;  Surgeon: [name], MD;  Location[facility] CATH LAB;  Service: Cardiovascular;  Laterality: N/A;  ? POLYPECTOMY  06/24/2021  ? Procedure: POLYPECTOMY;  Surgeon: Carol [facility], MD;  Location: [facility] ENDOS[facility];  Service: Endoscopy;;  ? RADIOFREQUENCY ABLATION  2005  ? for PSVT  ? TEE WITHOUT CARDIOVERSION N/A 07/27/2015  ? Procedure: TRANSESOPHAGEAL ECHOCARDIOGRAM (TEE);  Surgeon: Lelon Perla, MD;  Location: [facility];  Service: Cardiovascular;  Laterality: N/A;  ? TEE WITHOUT CARDIOVERSION N/A 09/15/2015  ? Procedure: TRANSESOPHAGEAL ECHOCARDIOGRAM (TEE);  Surgeon: Thayer Headings, MD;  Location: [facility];  Service: Cardiovascular;  Laterality: N/A;  ? ? ?SOCIAL HISTORY:  ?Social History  ? ?Socioeconomic History  ? Marital status: Married  ?  Spouse name: Not on file  ? Number of children: 0  ? Years of education: Not on file  ? Highest education level: Not on file  ?Occupational History  ? Occupation: Retired  ?Tobacco Use  ? Smoking  status: Former  ?  Packs/day: 1.00  ?  Years: 40.00  ?  Pack years: 40.00  ?  Types: Cigarettes  ?  Start date: 10/20/1972  ?  Quit date: 10/10/2012  ?  Years since quitting: 8.9  ? Smokeless tobacco: Never  ? Tobacco comments:  ?  Quit in May.   ?Vaping Use  ? Vaping Use: Never used  ?Substance and Sexual Activity  ? Alcohol use: Yes  ?  Alcohol/week: 0.0 standard drinks  ?  Comment: former drinker-- sober since 2013.   ? Drug use: No  ?  Types: Cocaine  ?  Comment: quit cocaine 10/2011  ? Sexual activity: Yes  ?  Partners: Female  ?Other Topics Concern  ? Not on file  ?Social History Narrative  ? Lives in [place].  ? ?Social Determinants of  Health  ? ?Financial Resource Strain: Not on file  ?Food Insecurity: Not on file  ?Transportation Needs: Not on file  ?Physical Activity: Not on file  ?Stress: Not on file  ?Social Connections: Not on

## 2021-09-20 NOTE — Progress Notes (Signed)
[name] presents today for Firmagon injection per the provider's orders.  Stable during administration without incident; injection site WNL; see MAR for injection details.  Patient tolerated procedure well and without incident.  No questions or complaints noted at this time. Discharge from clinic ambulatory in stable condition.  Alert and oriented X 3.  Follow up with [facility] as scheduled.  ?

## 2021-09-20 NOTE — Patient Instructions (Addendum)
[facility] at [facility] ?Discharge Instructions ? ? ?You were seen and examined today by Dr. Delton Coombes. ? ?He reviewed your lab work. Your blood is low.  We will check additional blood work at next visit to investigate this further. ? ?We will give your Firmagon injection today and every 4 weeks.  ? ?We sent a prescription to your pharmacy for Lactulose. Take as directed to help with your constipation.  ? ?Return as scheduled.  ? ? ?Thank you for choosing [facility] at [facility] to provide your oncology and hematology care.  To afford each patient quality time with our provider, please arrive at least 15 minutes before your scheduled appointment time.  ? ?If you have a lab appointment with the [facility] please come in thru the Main Entrance and check in at the main information desk. ? ?You need to re-schedule your appointment should you arrive 10 or more minutes late.  We strive to give you quality time with our providers, and arriving late affects you and other patients whose appointments are after yours.  Also, if you no show three or more times for appointments you may be dismissed from the clinic at the providers discretion.     ?Again, thank you for choosing [facility].  Our hope is that these requests will decrease the amount of time that you wait before being seen by our physicians.       ?_____________________________________________________________ ? ?Should you have questions after your visit to [facility], please contact our office at [phone] and follow the prompts.  Our office hours are 8:00 a.m. and 4:30 p.m. Monday - Friday.  Please note that voicemails left after 4:00 p.m. may not be returned until the following business day.  We are closed weekends and major holidays.  You do have access to a nurse 24-7, just call the main number to the clinic [phone] and do not press any options, hold on the line and a  nurse will answer the phone.   ? ?For prescription refill requests, have your pharmacy contact our office and allow 72 hours.   ? ?Due to Covid, you will need to wear a mask upon entering the hospital. If you do not have a mask, a mask will be given to you at the Main Entrance upon arrival. For doctor visits, patients may have 1 support person age 22 or older with them. For treatment visits, patients can not have anyone with them due to social distancing guidelines and our immunocompromised population.  ? ?   ?

## 2021-09-26 ENCOUNTER — Other Ambulatory Visit (HOSPITAL_COMMUNITY): Payer: Self-pay

## 2021-09-27 ENCOUNTER — Ambulatory Visit (INDEPENDENT_AMBULATORY_CARE_PROVIDER_SITE_OTHER): Payer: Medicare Other | Admitting: Internal Medicine

## 2021-09-27 ENCOUNTER — Encounter: Payer: Self-pay | Admitting: Internal Medicine

## 2021-09-27 VITALS — BP 102/60 | HR 64 | Ht 72.0 in | Wt 295.6 lb

## 2021-09-27 DIAGNOSIS — Z950 Presence of cardiac pacemaker: Secondary | ICD-10-CM | POA: Diagnosis not present

## 2021-09-27 DIAGNOSIS — I10 Essential (primary) hypertension: Secondary | ICD-10-CM | POA: Diagnosis not present

## 2021-09-27 DIAGNOSIS — I495 Sick sinus syndrome: Secondary | ICD-10-CM | POA: Diagnosis not present

## 2021-09-27 DIAGNOSIS — I480 Paroxysmal atrial fibrillation: Secondary | ICD-10-CM

## 2021-09-27 NOTE — Patient Instructions (Signed)
Medication Instructions:  ?Your physician recommends that you continue on your current medications as directed. Please refer to the Current Medication list given to you today. ? ?Labwork: ?None ordered. ? ?Testing/Procedures: ?None ordered. ? ?Follow-Up: ?Your physician wants you to follow-up in: one year with [name], MD or one of the following Advanced Practice Providers on your designated Care Team:   ?Tommye Standard, PA-C ?Legrand Como "Jonni Sanger" Tool, PA-C ? ?Remote monitoring is used to monitor your Pacemaker from home. This monitoring reduces the number of office visits required to check your device to one time per year. It allows [place] to keep an eye on the functioning of your device to ensure it is working properly. You are scheduled for a device check from home on 11/09/2021. You may send your transmission at any time that day. If you have a wireless device, the transmission will be sent automatically. After your physician reviews your transmission, you will receive a postcard with your next transmission date. ? ?Any Other Special Instructions Will Be Listed Below (If Applicable). ? ?If you need a refill on your cardiac medications before your next appointment, please call your pharmacy.  ? ?Important Information About Sugar ? ? ? ? ? ? ? ?

## 2021-09-27 NOTE — Progress Notes (Signed)
? ? ? ? ?HPI ?[name] returns today for followup. He has sinus node dysfunction, s/p PPM insertion, PAF, atrial tachycardia, obesity, CNS bleed, s/p Watchman. He also has prostate CA and undergone treatment. He denies chest pain or sob. No edema. No syncope. Rare palpitations. He has lost 20 lbs since his last visit. ?Allergies  ?Allergen Reactions  ? Trazodone And Nefazodone   ?  Nightmares  ? Lactose Intolerance (Gi) Other (See Comments)  ?  UPSET STOMACH ?  ? ? ? ?Current Outpatient Medications  ?Medication Sig Dispense Refill  ? allopurinol (ZYLOPRIM) 100 MG tablet TAKE 1 TABLET(100 MG) BY MOUTH DAILY (Patient taking differently: Take 100 mg by mouth daily.) 90 tablet 0  ? apalutamide (ERLEADA) 60 MG tablet TAKE 4 TABLETS (240 MG TOTAL) BY MOUTH DAILY. MAY BE TAKEN WITH OR WITHOUT FOOD. SWALLOW TABLETS WHOLE. (Patient taking differently: 240 mg every evening.) 120 tablet 3  ? aspirin EC 325 MG tablet Take 1 tablet (325 mg total) daily by mouth. 30 tablet 0  ? Blood Glucose Monitoring Suppl (BLOOD GLUCOSE SYSTEM PAK) KIT Use as directed to monitor FSBS 1x daily. Dx: E11.9. 1 kit 1  ? Cyanocobalamin (VITAMIN B 12) 500 MCG TABS Take 500 mcg by mouth in the morning.    ? diclofenac Sodium (VOLTAREN) 1 % GEL 2 g 2 (two) times daily.    ? fluticasone (FLONASE) 50 MCG/ACT nasal spray Place 2 sprays into both nostrils daily. (Patient taking differently: Place 2 sprays into both nostrils daily as needed for allergies.) 16 g 6  ? furosemide (LASIX) 20 MG tablet TAKE 3 TABLETS BY MOUTH DAILY (Patient taking differently: Take 60 mg by mouth daily.) 270 tablet 3  ? glipiZIDE (GLUCOTROL XL) 5 MG 24 hr tablet TAKE 1 TABLET(5 MG) BY MOUTH DAILY WITH BREAKFAST (Patient taking differently: 5 mg daily with breakfast.) 90 tablet 0  ? isosorbide mononitrate (IMDUR) 60 MG 24 hr tablet Take 1 tablet (60 mg total) by mouth daily. 30 tablet 0  ? Lactulose 20 GM/30ML SOLN Take 30 mLs (20 g total) by mouth daily as needed. Take 30 ml  by mouth every 3 hours until you have bowel movement then daily as needed 450 mL 3  ? levothyroxine (SYNTHROID) 200 MCG tablet TAKE 1 TABLET(200 MCG) BY MOUTH DAILY BEFORE AND BREAKFAST (Patient taking differently: Take 200 mcg by mouth daily before breakfast. Takes along with 25 mcg to total 225 mcg daily) 90 tablet 0  ? levothyroxine (SYNTHROID) 25 MCG tablet Take 25 mcg by mouth See admin instructions. Takes along with 200 mcg to total 225 mcg daily    ? loratadine (CLARITIN) 10 MG tablet TAKE 1 TABLET(10 MG) BY MOUTH DAILY AS NEEDED FOR ALLERGIES (Patient taking differently: Take 10 mg by mouth daily as needed for allergies.) 30 tablet 2  ? magnesium oxide (MAG-OX) 400 (240 Mg) MG tablet TAKE 1 TABLET(400 MG) BY MOUTH TWICE DAILY (Patient taking differently: Take 400 mg by mouth 2 (two) times daily.) 60 tablet 0  ? metFORMIN (GLUCOPHAGE) 500 MG tablet Take 1 tablet (500 mg total) by mouth daily with breakfast. (Patient taking differently: Take 500 mg by mouth 2 (two) times daily with a meal.) 90 tablet 0  ? naloxone (NARCAN) nasal spray 4 mg/0.1 mL Place 4 mg into the nose once as needed (accidental overdose).    ? nitroGLYCERIN (NITROSTAT) 0.4 MG SL tablet PLACE 1 TABLET UNDER THE TONGUE EVERY 5 MINUTES AS NEEDED FOR CHEST PAIN. CALL 911  AT THIRD DOSE IN 15 MINUTES (Patient taking differently: 0.4 mg every 5 (five) minutes as needed for chest pain.) 25 tablet 1  ? ONETOUCH ULTRA test strip USE TO TEST ONCE DAILY 100 strip 1  ? oxyCODONE-acetaminophen (PERCOCET) 10-325 MG tablet Take 1 tablet by mouth every 8 (eight) hours as needed for pain. 90 tablet 0  ? pantoprazole (PROTONIX) 40 MG tablet TAKE 1 TABLET(40 MG) BY MOUTH DAILY (Patient taking differently: 40 mg daily.) 30 tablet 3  ? potassium chloride SA (KLOR-CON M) 20 MEQ tablet Take 2 tablets (40 mEq total) by mouth daily. 30 tablet 3  ? rosuvastatin (CRESTOR) 40 MG tablet Take 40 mg by mouth every evening.    ? silodosin (RAPAFLO) 8 MG CAPS capsule TAKE 1  CAPSULE(8 MG) BY MOUTH TWICE DAILY (Patient taking differently: Take 8 mg by mouth 2 (two) times daily.) 180 capsule 3  ? solifenacin (VESICARE) 10 MG tablet Take 1 tablet (10 mg total) by mouth daily. 30 tablet 11  ? sotalol (BETAPACE) 80 MG tablet Take 1 tablet (80 mg total) by mouth 2 (two) times daily. 180 tablet 3  ? vitamin B-12 (CYANOCOBALAMIN) 500 MCG tablet Take 500 mcg by mouth daily.    ? Vitamin D, Ergocalciferol, (DRISDOL) 1.25 MG (50000 UNIT) CAPS capsule TAKE 1 CAPSULE BY MOUTH EVERY 7 DAYS (Patient taking differently: Take 50,000 Units by mouth every Thursday.) 12 capsule 0  ? ?No current facility-administered medications for this visit.  ? ? ? ?Past Medical History:  ?Diagnosis Date  ? Arteriosclerotic cardiovascular disease (ASCVD) 2005  ? catheterization in 10/2010:50% mid LAD, diffuse distal disease, circumflex irregularities, large dominant RCA with a 50% ostial, 70% distal, 60% posterolateral and 70% PDA; normal EF  ? Arthritis   ? Benign prostatic hypertrophy   ? Bilateral carpal tunnel syndrome 07/03/2018  ? Cerebrovascular disease 2010  ? R. carotid endarterectomy; Duplex in 10/2010-widely patent ICAs, subtotal left vertebral-not thought to be contributing to symptoms  ? Cervical spine disease   ? CT in 2012-advanced degeneration and spondylosis with moderate spinal stenosis at C3-C6  ? CHF (congestive heart failure) ([place])   ? Depression   ? Diabetes mellitus without complication ([place])   ? Erectile dysfunction   ? Family history of breast cancer   ? Family history of cancer of mouth   ? Family history of CML (chronic monocytic leukemia)   ? Family history of lung cancer   ? Family history of ovarian cancer   ? Family history of stomach cancer   ? Gastroesophageal reflux disease   ? H/O hiatal hernia   ? H/O: substance abuse ([place])   ? Cocaine, marijuana, alcohol.  Quit 2013.   ? Hyperlipidemia   ? Hypertension   ? Non-ST elevation myocardial infarction (NSTEMI), initial episode of care [facility])  12/02/2013  ? DES LAD  ? Obesity   ? Prostate cancer ([place])   ? Sleep apnea   ? CPAP  ? Tachy-brady syndrome ([place])   ? a. s/p STJ dual chamber PPM   ? Thyroid disease   ? Tobacco abuse   ? Quit 2014  ? 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 Bilateral  ? ? ?ROS: ? ? All systems reviewed and negative except as noted in the HPI. ? ? ?Past Surgical History:  ?Procedure Laterality Date  ? BRAIN SURGERY  2015  ? hematoma evacuation  ? BURR HOLE Right 04/13/2014  ? Procedure: Haskell Flirt;  Surgeon: Charlie Pitter, MD;  Location:  [place] [facility];  Service: Neurosurgery;  Laterality: Right;  ? CAROTID ENDARTERECTOMY Right Feb. 25, 2010  ?  CEA  ? COLONOSCOPY WITH PROPOFOL N/A 06/24/2021  ? Procedure: COLONOSCOPY WITH PROPOFOL;  Surgeon: Carol Ada, MD;  Location: [facility] END[facility];  Service: Endoscopy;  Laterality: N/A;  ? CORONARY ANGIOPLASTY WITH STENT PLACEMENT  12/03/2013  ? LAD 90%-->0% W/ Promus Premier DES 3.0 mm x 16 mm, CFX OK, RCA 40%, EF 70-75%  ? LEFT ATRIAL APPENDAGE OCCLUSION N/A 08/05/2015  ? Procedure: LEFT ATRIAL APPENDAGE OCCLUSION;  Surgeon: [name], MD;  Location: [place] CV LAB;  Service: Cardiovascular;  Laterality: N/A;  ? LEFT HEART CATH AND CORONARY ANGIOGRAPHY N/A 09/13/2021  ? Procedure: LEFT HEART CATH AND CORONARY ANGIOGRAPHY;  Surgeon: Early Osmond, MD;  Location: [place] CV LAB;  Service: Cardiovascular;  Laterality: N/A;  ? LEFT HEART CATHETERIZATION WITH CORONARY ANGIOGRAM Left 12/03/2013  ? Procedure: LEFT HEART CATHETERIZATION WITH CORONARY ANGIOGRAM;  Surgeon: Leonie Man, MD;  Location: [facility] CATH LAB;  Service: Cardiovascular;  Laterality: Left;  ? LEFT HEART CATHETERIZATION WITH CORONARY ANGIOGRAM N/A 01/26/2014  ? Procedure: LEFT HEART CATHETERIZATION WITH CORONARY ANGIOGRAM;  Surgeon: Jettie Booze, MD;  Location: [facility] CATH LAB;  Service: Cardiovascular;  Laterality: N/A;  ? LEFT HEART CATHETERIZATION WITH CORONARY ANGIOGRAM N/A 08/03/2014  ? Procedure: LEFT HEART CATHETERIZATION  WITH CORONARY ANGIOGRAM;  Surgeon: Burnell Blanks, MD;  Location: [facility] CATH LAB;  Service: Cardiovascular;  Laterality: N/A;  ? PERCUTANEOUS CORONARY STENT INTERVENTION (PCI-S)  12/03/2013  ? Proced

## 2021-09-29 ENCOUNTER — Other Ambulatory Visit: Payer: Self-pay

## 2021-09-29 MED ORDER — POTASSIUM CHLORIDE CRYS ER 20 MEQ PO TBCR: 20.0000 meq | EXTENDED_RELEASE_TABLET | Freq: Every day | ORAL | 3 refills | Status: DC

## 2021-09-30 ENCOUNTER — Other Ambulatory Visit (HOSPITAL_COMMUNITY): Payer: Self-pay

## 2021-10-04 LAB — T4, FREE: Free T4: 1.38 ng/dL (ref 0.82–1.77)

## 2021-10-04 LAB — TSH: TSH: 6.36 u[IU]/mL — ABNORMAL HIGH (ref 0.450–4.500)

## 2021-10-04 NOTE — Progress Notes (Signed)
?Cardiology Office Note:   ? ?Date:  10/05/2021  ? ?ID:  Peter Dunlap, DOB [birth year], MRN [id number] ? ?PCP:  Dulce Sellar, MD  ?[facility] HeartCare Providers ?Cardiologist:  [name], MD    ?Referring MD: Dulce Sellar, MD  ? ?Chief Complaint:  Hospitalization Follow-up (Admitted with chest pain ) ?  ? ?Patient Profile: ?Paroxysmal Supraventricular Tachycardia  ?S/p RFA ablation ?(HFpEF) heart failure with preserved ejection fraction  ?Hypertension w severe LVH ?Hypertrophic CM ?Echocardiogram 09/2021: severe LVH, mild LVOT gradient, no SAM  ?Bradycardia s/p pacemaker ?Paroxysmal atrial fibrillation ?S/p LAAOD (Watchman) - no longer on anticoagulation  ?Sotalol Rx  ?Coronary artery disease  ?S/p DES to LAD in 12/2013 ?S/p DES to OM1 in 01/2014 ?Cath 2016: mLAD stent ok, OM stent ok ?Hyperlipidemia  ?Hx of subdural hematoma 2/2 fall - s/p evacuation ?Carotid artery disease  ?S/p R CEA  ?Prostate CA ?Chemotherapy  ?Diabetes mellitus  ?OSA ?Obesity  ? ?Prior CV Studies: ?LEFT HEART CATH AND CORONARY ANGIOGRAPHY 09/13/2021 ?  Prox RCA lesion is 40%  / RPAV lesion is 40% / RPDA lesion is 50%  ?  2nd Mrg lesion is 10% stenosed-stent patent ?  Mid LAD lesion is 10% stenosed -stent patent ?Mean aortic valve gradient of 20 mmHg. ?  ?ECHO COMPLETE WITH IMAGING ENHANCING AGENT 09/12/2021 ?EF 70-75, hyperdynamic LV function, no RWMA, severe LVH, GR 1 DD, mild LVOT gradient due to LVOT narrowing in the setting of severe LVH, no S.A.M., normal RVSF, normal PASP, RVSP 25.7, trivial MR, trivial AI, AV sclerosis without stenosis, borderline dilation of ascending aorta (36 mm) ? ?VAS US CAROTID DUPLEX BILATERAL 02/03/2021 ?Right Carotid: Velocities in the right ICA are consistent with a 1-39% stenosis. ?Patent CEA. ?Left Carotid: Velocities in the left ICA are consistent with a 1-39% stenosis. ? ?Carotid US 11/30/16 ?R CEA patent; L 1-39 ?  ?TEE 09/15/15 ?Normal systolic function, mild MR ?  ?TEE 07/27/15 ?EF 55-60, normal wall  motion, mild MR, mild LAE, small to moderate pericardial effusion ?  ?Echo 12/21/14 ?Severe concentric LVH, EF 60-65, normal wall motion, grade 2 diastolic dysfunction, mildly dilated aortic root (39), MAC, small to moderate pericardial effusion ?  ?Cardiac Catheterization 08/03/14 ?LAD prox and mid 30, mid stent patent, Dx prox 30 ?OM stent patent ?RCA prox/mid/dist 30, PLB ostial 60, PDA ostial 60 ?  ?PCI 01/26/14 ?80% proximal OM1 stenosis >> successfully treated with a 2.5 x 20 Promus drug-eluting stent ?  ?PCI 12/03/13 ?PCI of mid LAD with Promus DES ? ? ?History of Present Illness:   ?Peter Dunlap is a 66 y.o. male with the above problem list.  He was last seen by Dr. Lovena Le in Feb 2022.  He was recently admitted 4/9-4/12 with chest pain.  His hs-Trops were minimally elevated and flat inconsistent with ACS.  CT was neg for PE.  Cardiac catheterization was performed and demonstrated patent stents in the LAD and OM.  There was mild to mod non-obstructive disease elsewhere. He did have a mean AV gradient of 20.  Echocardiogram did not demonstrate aortic stenosis.  EF was 70-75 with severe LVH, mild LVOT gradient and no SAM.  Sotalol was reduced due to prolonged QT (80 mg twice daily).  He was seen by Dr. Lovena Le last week for f/u.  He returns for post hospitalization f/u.  He is here alone.  He had 1 episode of chest pain relieved by antiacids.  He has not had significant shortness of breath, orthopnea,  leg edema.  He has not had syncope.  He does get dizzy at times with low blood pressures. ?   ?Past Medical History:  ?Diagnosis Date  ? Arteriosclerotic cardiovascular disease (ASCVD) 2005  ? catheterization in 10/2010:50% mid LAD, diffuse distal disease, circumflex irregularities, large dominant RCA with a 50% ostial, 70% distal, 60% posterolateral and 70% PDA; normal EF  ? Arthritis   ? Benign prostatic hypertrophy   ? Bilateral carpal tunnel syndrome 07/03/2018  ? Cerebrovascular disease 2010  ? R. carotid  endarterectomy; Duplex in 10/2010-widely patent ICAs, subtotal left vertebral-not thought to be contributing to symptoms  ? Cervical spine disease   ? CT in 2012-advanced degeneration and spondylosis with moderate spinal stenosis at C3-C6  ? CHF (congestive heart failure) ([place])   ? Depression   ? Diabetes mellitus without complication ([place])   ? Erectile dysfunction   ? Family history of breast cancer   ? Family history of cancer of mouth   ? Family history of CML (chronic monocytic leukemia)   ? Family history of lung cancer   ? Family history of ovarian cancer   ? Family history of stomach cancer   ? Gastroesophageal reflux disease   ? H/O hiatal hernia   ? H/O: substance abuse ([place])   ? Cocaine, marijuana, alcohol.  Quit 2013.   ? Hyperlipidemia   ? Hypertension   ? Non-ST elevation myocardial infarction (NSTEMI), initial episode of care [facility]) 12/02/2013  ? DES LAD  ? Obesity   ? Prostate cancer ([place])   ? Sleep apnea   ? CPAP  ? Tachy-brady syndrome ([place])   ? a. s/p STJ dual chamber PPM   ? Thyroid disease   ? Tobacco abuse   ? Quit 2014  ? 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 Bilateral  ? ?Current Medications: ?Current Meds  ?Medication Sig  ? allopurinol (ZYLOPRIM) 100 MG tablet TAKE 1 TABLET(100 MG) BY MOUTH DAILY  ? apalutamide (ERLEADA) 60 MG tablet TAKE 4 TABLETS (240 MG TOTAL) BY MOUTH DAILY. MAY BE TAKEN WITH OR WITHOUT FOOD. SWALLOW TABLETS WHOLE.  ? aspirin EC 325 MG tablet Take 1 tablet (325 mg total) daily by mouth.  ? Blood Glucose Monitoring Suppl (BLOOD GLUCOSE SYSTEM PAK) KIT Use as directed to monitor FSBS 1x daily. Dx: E11.9.  ? Cyanocobalamin (VITAMIN B 12) 500 MCG TABS Take 500 mcg by mouth in the morning.  ? diclofenac Sodium (VOLTAREN) 1 % GEL 2 g 2 (two) times daily.  ? fluticasone (FLONASE) 50 MCG/ACT nasal spray Place 2 sprays into both nostrils daily.  ? furosemide (LASIX) 20 MG tablet TAKE 3 TABLETS BY MOUTH DAILY  ? glipiZIDE (GLUCOTROL XL) 5 MG 24 hr tablet TAKE 1 TABLET(5 MG) BY  MOUTH DAILY WITH BREAKFAST  ? isosorbide mononitrate (IMDUR) 30 MG 24 hr tablet Take 1 tablet (30 mg total) by mouth daily.  ? Lactulose 20 GM/30ML SOLN Take 30 mLs (20 g total) by mouth daily as needed. Take 30 ml by mouth every 3 hours until you have bowel movement then daily as needed  ? levothyroxine (SYNTHROID) 200 MCG tablet TAKE 1 TABLET(200 MCG) BY MOUTH DAILY BEFORE AND BREAKFAST  ? levothyroxine (SYNTHROID) 25 MCG tablet Take 25 mcg by mouth See admin instructions. Takes along with 200 mcg to total 225 mcg daily  ? loratadine (CLARITIN) 10 MG tablet TAKE 1 TABLET(10 MG) BY MOUTH DAILY AS NEEDED FOR ALLERGIES  ? magnesium oxide (MAG-OX) 400 (240 Mg) MG tablet TAKE  1 TABLET(400 MG) BY MOUTH TWICE DAILY  ? metFORMIN (GLUCOPHAGE) 500 MG tablet Take 1 tablet (500 mg total) by mouth daily with breakfast.  ? naloxone (NARCAN) nasal spray 4 mg/0.1 mL Place 4 mg into the nose once as needed (accidental overdose).  ? nitroGLYCERIN (NITROSTAT) 0.4 MG SL tablet PLACE 1 TABLET UNDER THE TONGUE EVERY 5 MINUTES AS NEEDED FOR CHEST PAIN. CALL 911 AT THIRD DOSE IN 15 MINUTES  ? ONETOUCH ULTRA test strip USE TO TEST ONCE DAILY  ? oxyCODONE-acetaminophen (PERCOCET) 10-325 MG tablet Take 1 tablet by mouth every 8 (eight) hours as needed for pain.  ? pantoprazole (PROTONIX) 40 MG tablet TAKE 1 TABLET(40 MG) BY MOUTH DAILY  ? potassium chloride SA (KLOR-CON M) 20 MEQ tablet Take 1 tablet (20 mEq total) by mouth daily.  ? rosuvastatin (CRESTOR) 40 MG tablet Take 40 mg by mouth every evening.  ? silodosin (RAPAFLO) 8 MG CAPS capsule TAKE 1 CAPSULE(8 MG) BY MOUTH TWICE DAILY  ? solifenacin (VESICARE) 10 MG tablet Take 1 tablet (10 mg total) by mouth daily.  ? sotalol (BETAPACE) 80 MG tablet Take 1 tablet (80 mg total) by mouth 2 (two) times daily.  ? Vitamin D, Ergocalciferol, (DRISDOL) 1.25 MG (50000 UNIT) CAPS capsule TAKE 1 CAPSULE BY MOUTH EVERY 7 DAYS  ? [DISCONTINUED] isosorbide mononitrate (IMDUR) 60 MG 24 hr tablet Take 1  tablet (60 mg total) by mouth daily.  ?  ?Allergies:   Trazodone and nefazodone and Lactose intolerance (gi)  ? ?Social History  ? ?Tobacco Use  ? Smoking status: Former  ?  Packs/day: 1.00  ?  Years: 40.00  ?  Pa

## 2021-10-05 ENCOUNTER — Encounter: Payer: Self-pay | Admitting: Physician Assistant

## 2021-10-05 ENCOUNTER — Ambulatory Visit (INDEPENDENT_AMBULATORY_CARE_PROVIDER_SITE_OTHER): Payer: Medicare Other | Admitting: Physician Assistant

## 2021-10-05 VITALS — BP 100/58 | HR 70 | Ht 72.0 in | Wt 295.6 lb

## 2021-10-05 DIAGNOSIS — I422 Other hypertrophic cardiomyopathy: Secondary | ICD-10-CM

## 2021-10-05 DIAGNOSIS — I5032 Chronic diastolic (congestive) heart failure: Secondary | ICD-10-CM | POA: Diagnosis not present

## 2021-10-05 DIAGNOSIS — I2510 Atherosclerotic heart disease of native coronary artery without angina pectoris: Secondary | ICD-10-CM

## 2021-10-05 DIAGNOSIS — I480 Paroxysmal atrial fibrillation: Secondary | ICD-10-CM | POA: Diagnosis not present

## 2021-10-05 DIAGNOSIS — I6523 Occlusion and stenosis of bilateral carotid arteries: Secondary | ICD-10-CM

## 2021-10-05 DIAGNOSIS — E782 Mixed hyperlipidemia: Secondary | ICD-10-CM

## 2021-10-05 DIAGNOSIS — I110 Hypertensive heart disease with heart failure: Secondary | ICD-10-CM

## 2021-10-05 MED ORDER — ISOSORBIDE MONONITRATE ER 30 MG PO TB24: 30.0000 mg | ORAL_TABLET | Freq: Every day | ORAL | 3 refills | Status: DC

## 2021-10-05 NOTE — Assessment & Plan Note (Addendum)
His sotalol dose was reduced in the hospital due to prolonged QT.  He had follow-up with Dr. Lovena Le last week with EP.  He is not a candidate for anticoagulation and underwent watchman placement.  He remains on aspirin 325 mg daily. ?

## 2021-10-05 NOTE — Assessment & Plan Note (Signed)
Blood pressure is low at times.  He only takes furosemide 1 or 2 days a week.  Decrease isosorbide to 30 mg daily as noted. ?

## 2021-10-05 NOTE — Patient Instructions (Addendum)
Medication Instructions:  ?Your physician has recommended you make the following change in your medication:  ? REDUCE the Imdur (Isorsobide) to 30 mg taking 1 daily.  You can cut the 60 mg tablets in 1/2 to use them up.  There is a new prescription for the 30 mg ? ? ?*If you need a refill on your cardiac medications before your next appointment, please call your pharmacy* ? ? ?Lab Work: ?None ordered ? ?If you have labs (blood work) drawn today and your tests are completely normal, you will receive your results only by: ?MyChart Message (if you have MyChart) OR ?A paper copy in the mail ?If you have any lab test that is abnormal or we need to change your treatment, we will call you to review the results. ? ? ?Testing/Procedures: ?None ordered ? ? ?Follow-Up: ?At [facility], you and your health needs are our priority.  As part of our continuing mission to provide you with exceptional heart care, we have created designated Provider Care Teams.  These Care Teams include your primary Cardiologist (physician) and Advanced Practice Providers (APPs -  Physician Assistants and Nurse Practitioners) who all work together to provide you with the care you need, when you need it. ? ?We recommend signing up for the patient portal called "MyChart".  Sign up information is provided on this After Visit Summary.  MyChart is used to connect with patients for Virtual Visits (Telemedicine).  Patients are able to view lab/test results, encounter notes, upcoming appointments, etc.  Non-urgent messages can be sent to your provider as well.   ?To learn more about what you can do with MyChart, go to [email or web address].   ? ?Your next appointment:   ?12 month(s) ? ?The format for your next appointment:   ?In Person ? ?Provider:   ?You will see one of the following Advanced Practice Providers on your designated Care Team:   ?Tommye Standard, PA-C ?Legrand Como "Jonni Sanger" [place], PA-C ? ?  ? ?Other Instructions ? ? ?Important Information About  S[place] ? ? ? ? ?  ?

## 2021-10-05 NOTE — Assessment & Plan Note (Signed)
He only takes furosemide 1 to 2 days a week.  Overall, his volume status is stable.  Recent creatinine was stable.  Continue current management.  ?

## 2021-10-05 NOTE — Assessment & Plan Note (Signed)
History of stenting to the LAD and OM1.  Recent admission with chest pain.  Cardiac catheterization demonstrated patent stents in the LAD and OM.  There was moderate nonobstructive disease elsewhere.  Continue aspirin 325 mg daily, Crestor 40 mg daily.  He does have low blood pressures and dizziness at times.  Decrease isosorbide to 30 mg daily.  Follow-up as planned. ?

## 2021-10-05 NOTE — Assessment & Plan Note (Signed)
Patent right CEA site by carotid US in September 2022.  Continue aspirin 325 mg daily, Crestor 40 mg daily. ?

## 2021-10-05 NOTE — Assessment & Plan Note (Signed)
LDL optimal.  Continue Crestor 40 mg daily. ?

## 2021-10-05 NOTE — Assessment & Plan Note (Signed)
He had a mild LVOT gradient and no S.A.M.  On his echocardiogram.  I will review his echocardiogram with Dr. Gasper Sells to see if he qualifies for any management for HOCM. ?

## 2021-10-10 ENCOUNTER — Ambulatory Visit (INDEPENDENT_AMBULATORY_CARE_PROVIDER_SITE_OTHER): Payer: Medicare Other | Admitting: Nurse Practitioner

## 2021-10-10 ENCOUNTER — Encounter: Payer: Self-pay | Admitting: Nurse Practitioner

## 2021-10-10 VITALS — BP 140/77 | HR 72 | Ht 72.0 in | Wt 293.0 lb

## 2021-10-10 DIAGNOSIS — E669 Obesity, unspecified: Secondary | ICD-10-CM

## 2021-10-10 DIAGNOSIS — E1165 Type 2 diabetes mellitus with hyperglycemia: Secondary | ICD-10-CM | POA: Diagnosis not present

## 2021-10-10 DIAGNOSIS — E890 Postprocedural hypothyroidism: Secondary | ICD-10-CM

## 2021-10-10 DIAGNOSIS — E782 Mixed hyperlipidemia: Secondary | ICD-10-CM

## 2021-10-10 DIAGNOSIS — I10 Essential (primary) hypertension: Secondary | ICD-10-CM

## 2021-10-10 DIAGNOSIS — E559 Vitamin D deficiency, unspecified: Secondary | ICD-10-CM

## 2021-10-10 DIAGNOSIS — E1169 Type 2 diabetes mellitus with other specified complication: Secondary | ICD-10-CM

## 2021-10-10 LAB — POCT GLYCOSYLATED HEMOGLOBIN (HGB A1C): HbA1c, POC (controlled diabetic range): 6.7 % (ref 0.0–7.0)

## 2021-10-10 MED ORDER — LEVOTHYROXINE SODIUM 200 MCG PO TABS: 200.0000 ug | ORAL_TABLET | Freq: Every day | ORAL | 3 refills | Status: DC

## 2021-10-10 MED ORDER — LEVOTHYROXINE SODIUM 25 MCG PO TABS: 25.0000 ug | ORAL_TABLET | ORAL | 3 refills | Status: DC

## 2021-10-10 MED ORDER — ONETOUCH ULTRA VI STRP: ORAL_STRIP | 3 refills | Status: DC

## 2021-10-10 MED ORDER — METFORMIN HCL 500 MG PO TABS: 500.0000 mg | ORAL_TABLET | Freq: Every day | ORAL | 3 refills | Status: DC

## 2021-10-10 MED ORDER — GLIPIZIDE ER 5 MG PO TB24: 5.0000 mg | ORAL_TABLET | Freq: Every day | ORAL | 3 refills | Status: DC

## 2021-10-10 NOTE — Progress Notes (Signed)
10/10/2021 ? ?                                               Endocrinology Follow Up ? ?Subjective:  ? ? Patient ID: Peter Dunlap, male    DOB: Mar 04, 1956, PCP Dulce Sellar, MD ? ?Patient presents today for follow-up of hypothyroidism following radioactive iodine treatment, and type 2 diabetes. ? ?Past Medical History:  ?Diagnosis Date  ? Arteriosclerotic cardiovascular disease (ASCVD) 2005  ? catheterization in 10/2010:50% mid LAD, diffuse distal disease, circumflex irregularities, large dominant RCA with a 50% ostial, 70% distal, 60% posterolateral and 70% PDA; normal EF  ? Arthritis   ? Benign prostatic hypertrophy   ? Bilateral carpal tunnel syndrome 07/03/2018  ? Cerebrovascular disease 2010  ? R. carotid endarterectomy; Duplex in 10/2010-widely patent ICAs, subtotal left vertebral-not thought to be contributing to symptoms  ? Cervical spine disease   ? CT in 2012-advanced degeneration and spondylosis with moderate spinal stenosis at C3-C6  ? CHF (congestive heart failure) ([place])   ? Depression   ? Diabetes mellitus without complication ([place])   ? Erectile dysfunction   ? Family history of breast cancer   ? Family history of cancer of mouth   ? Family history of CML (chronic monocytic leukemia)   ? Family history of lung cancer   ? Family history of ovarian cancer   ? Family history of stomach cancer   ? Gastroesophageal reflux disease   ? H/O hiatal hernia   ? H/O: substance abuse ([place])   ? Cocaine, marijuana, alcohol.  Quit 2013.   ? Hyperlipidemia   ? Hypertension   ? Non-ST elevation myocardial infarction (NSTEMI), initial episode of care [facility]) 12/02/2013  ? DES LAD  ? Obesity   ? Prostate cancer ([place])   ? Sleep apnea   ? CPAP  ? Tachy-brady syndrome ([place])   ? a. s/p STJ dual chamber PPM   ? Thyroid disease   ? Tobacco abuse   ? Quit 2014  ? 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 Bilateral  ? ?Past Surgical History:  ?Procedure Laterality Date  ? BRAIN SURGERY  2015  ? hematoma evacuation  ? BURR HOLE Right  04/13/2014  ? Procedure: Haskell Flirt;  Surgeon: Charlie Pitter, MD;  Location: [facility];  Service: Neurosurgery;  Laterality: Right;  ? CAROTID ENDARTERECTOMY Right Feb. 25, 2010  ?  CEA  ? COLONOSCOPY WITH PROPOFOL N/A 06/24/2021  ? Procedure: COLONOSCOPY WITH PROPOFOL;  Surgeon: Carol Ada, MD;  Location: [facility] ENDOSCOPY;  Service: Endoscopy;  Laterality: N/A;  ? CORONARY ANGIOPLASTY WITH STENT PLACEMENT  12/03/2013  ? LAD 90%-->0% W/ Promus Premier DES 3.0 mm x 16 mm, CFX OK, RCA 40%, EF 70-75%  ? LEFT ATRIAL APPENDAGE OCCLUSION N/A 08/05/2015  ? Procedure: LEFT ATRIAL APPENDAGE OCCLUSION;  Surgeon: [name], MD;  Location: [place] CV LAB;  Service: Cardiovascular;  Laterality: N/A;  ? LEFT HEART CATH AND CORONARY ANGIOGRAPHY N/A 09/13/2021  ? Procedure: LEFT HEART CATH AND CORONARY ANGIOGRAPHY;  Surgeon: Early Osmond, MD;  Location: [place] CV LAB;  Service: Cardiovascular;  Laterality: N/A;  ? LEFT HEART CATHETERIZATION WITH CORONARY ANGIOGRAM Left 12/03/2013  ? Procedure: LEFT HEART CATHETERIZATION WITH CORONARY ANGIOGRAM;  Surgeon: Leonie Man, MD;  Location: [facility] CATH LAB;  Service: Cardiovascular;  Laterality: Left;  ?  LEFT HEART CATHETERIZATION WITH CORONARY ANGIOGRAM N/A 01/26/2014  ? Procedure: LEFT HEART CATHETERIZATION WITH CORONARY ANGIOGRAM;  Surgeon: Jettie Booze, MD;  Location: [facility] CATH LAB;  Service: Cardiovascular;  Laterality: N/A;  ? LEFT HEART CATHETERIZATION WITH CORONARY ANGIOGRAM N/A 08/03/2014  ? Procedure: LEFT HEART CATHETERIZATION WITH CORONARY ANGIOGRAM;  Surgeon: Burnell Blanks, MD;  Location: [facility] CATH LAB;  Service: Cardiovascular;  Laterality: N/A;  ? PERCUTANEOUS CORONARY STENT INTERVENTION (PCI-S)  12/03/2013  ? Procedure: PERCUTANEOUS CORONARY STENT INTERVENTION (PCI-S);  Surgeon: Leonie Man, MD;  Location: [facility] CATH LAB;  Service: Cardiovascular;;  ? PERMANENT PACEMAKER INSERTION N/A 09/18/2014  ? Procedure: PERMANENT PACEMAKER INSERTION;  Surgeon: [name], MD;  Location: [facility] CATH LAB;  Service: Cardiovascular;  Laterality: N/A;  ? POLYPECTOMY  06/24/2021  ? Procedure: POLYPECTOMY;  Surgeon: Carol Ada, MD;  Location: [facility];  Service: Endoscopy;;  ? RADIOFREQUENCY ABLATION  2005  ? for PSVT  ? TEE WITHOUT CARDIOVERSION N/A 07/27/2015  ? Procedure: TRANSESOPHAGEAL ECHOCARDIOGRAM (TEE);  Surgeon: Lelon Perla, MD;  Location: [place];  Service: Cardiovascular;  Laterality: N/A;  ? TEE WITHOUT CARDIOVERSION N/A 09/15/2015  ? Procedure: TRANSESOPHAGEAL ECHOCARDIOGRAM (TEE);  Surgeon: Thayer Headings, MD;  Location: [place];  Service: Cardiovascular;  Laterality: N/A;  ? ?Social History  ? ?Socioeconomic History  ? Marital status: Married  ?  Spouse name: Not on file  ? Number of children: 0  ? Years of education: Not on file  ? Highest education level: Not on file  ?Occupational History  ? Occupation: Retired  ?Tobacco Use  ? Smoking status: Former  ?  Packs/day: 1.00  ?  Years: 40.00  ?  Pack years: 40.00  ?  Types: Cigarettes  ?  Start date: 10/20/1972  ?  Quit date: 10/10/2012  ?  Years since quitting: 9.0  ? Smokeless tobacco: Never  ? Tobacco comments:  ?  Quit in May.   ?Vaping Use  ? Vaping Use: Never used  ?Substance and Sexual Activity  ? Alcohol use: Yes  ?  Alcohol/week: 0.0 standard drinks  ?  Comment: former drinker-- sober since 2013.   ? Drug use: No  ?  Types: Cocaine  ?  Comment: quit cocaine 10/2011  ? Sexual activity: Yes  ?  Partners: Female  ?Other Topics Concern  ? Not on file  ?Social History Narrative  ? Lives in [place].  ? ?Social Determinants of Health  ? ?Financial Resource Strain: Not on file  ?Food Insecurity: Not on file  ?Transportation Needs: Not on file  ?Physical Activity: Not on file  ?Stress: Not on file  ?Social Connections: Not on file  ? ?Outpatient Encounter Medications as of 10/10/2021  ?Medication Sig  ? allopurinol (ZYLOPRIM) 100 MG tablet TAKE 1 TABLET(100 MG) BY MOUTH DAILY  ? apalutamide (ERLEADA) 60  MG tablet TAKE 4 TABLETS (240 MG TOTAL) BY MOUTH DAILY. MAY BE TAKEN WITH OR WITHOUT FOOD. SWALLOW TABLETS WHOLE.  ? aspirin EC 325 MG tablet Take 1 tablet (325 mg total) daily by mouth.  ? Blood Glucose Monitoring Suppl (BLOOD GLUCOSE SYSTEM PAK) KIT Use as directed to monitor FSBS 1x daily. Dx: E11.9.  ? Cyanocobalamin (VITAMIN B 12) 500 MCG TABS Take 500 mcg by mouth in the morning.  ? diclofenac Sodium (VOLTAREN) 1 % GEL 2 g 2 (two) times daily.  ? fluticasone (FLONASE) 50 MCG/ACT nasal spray Place 2 sprays into both nostrils daily.  ? furosemide (LASIX) 20 MG  tablet TAKE 3 TABLETS BY MOUTH DAILY  ? glipiZIDE (GLUCOTROL XL) 5 MG 24 hr tablet Take 1 tablet (5 mg total) by mouth daily with breakfast.  ? glucose blood (ONETOUCH ULTRA) test strip Use as instructed to monitor glucose twice daily.  ? isosorbide mononitrate (IMDUR) 30 MG 24 hr tablet Take 1 tablet (30 mg total) by mouth daily.  ? Lactulose 20 GM/30ML SOLN Take 30 mLs (20 g total) by mouth daily as needed. Take 30 ml by mouth every 3 hours until you have bowel movement then daily as needed  ? levothyroxine (SYNTHROID) 200 MCG tablet Take 1 tablet (200 mcg total) by mouth daily before breakfast.  ? levothyroxine (SYNTHROID) 25 MCG tablet Take 1 tablet (25 mcg total) by mouth See admin instructions. Takes along with 200 mcg to total 225 mcg daily  ? loratadine (CLARITIN) 10 MG tablet TAKE 1 TABLET(10 MG) BY MOUTH DAILY AS NEEDED FOR ALLERGIES  ? magnesium oxide (MAG-OX) 400 (240 Mg) MG tablet TAKE 1 TABLET(400 MG) BY MOUTH TWICE DAILY  ? metFORMIN (GLUCOPHAGE) 500 MG tablet Take 1 tablet (500 mg total) by mouth daily with breakfast.  ? naloxone (NARCAN) nasal spray 4 mg/0.1 mL Place 4 mg into the nose once as needed (accidental overdose).  ? nitroGLYCERIN (NITROSTAT) 0.4 MG SL tablet PLACE 1 TABLET UNDER THE TONGUE EVERY 5 MINUTES AS NEEDED FOR CHEST PAIN. CALL 911 AT THIRD DOSE IN 15 MINUTES  ? oxyCODONE-acetaminophen (PERCOCET) 10-325 MG tablet Take 1  tablet by mouth every 8 (eight) hours as needed for pain.  ? pantoprazole (PROTONIX) 40 MG tablet TAKE 1 TABLET(40 MG) BY MOUTH DAILY  ? potassium chloride SA (KLOR-CON M) 20 MEQ tablet Take 1 tablet

## 2021-10-10 NOTE — Patient Instructions (Signed)
Diabetes Mellitus and Foot Care Foot care is an important part of your health, especially when you have diabetes. Diabetes may cause you to have problems because of poor blood flow (circulation) to your feet and legs, which can cause your skin to: Become thinner and drier. Break more easily. Heal more slowly. Peel and crack. You may also have nerve damage (neuropathy) in your legs and feet, causing decreased feeling in them. This means that you may not notice minor injuries to your feet that could lead to more serious problems. Noticing and addressing any potential problems early is the best way to prevent future foot problems. How to care for your feet Foot hygiene  Wash your feet daily with warm water and mild soap. Do not use hot water. Then, pat your feet and the areas between your toes until they are completely dry. Do not soak your feet as this can dry your skin. Trim your toenails straight across. Do not dig under them or around the cuticle. File the edges of your nails with an emery board or nail file. Apply a moisturizing lotion or petroleum jelly to the skin on your feet and to dry, brittle toenails. Use lotion that does not contain alcohol and is unscented. Do not apply lotion between your toes. Shoes and socks Wear clean socks or stockings every day. Make sure they are not too tight. Do not wear knee-high stockings since they may decrease blood flow to your legs. Wear shoes that fit properly and have enough cushioning. Always look in your shoes before you put them on to be sure there are no objects inside. To break in new shoes, wear them for just a few hours a day. This prevents injuries on your feet. Wounds, scrapes, corns, and calluses  Check your feet daily for blisters, cuts, bruises, sores, and redness. If you cannot see the bottom of your feet, use a mirror or ask someone for help. Do not cut corns or calluses or try to remove them with medicine. If you find a minor scrape,  cut, or break in the skin on your feet, keep it and the skin around it clean and dry. You may clean these areas with mild soap and water. Do not clean the area with peroxide, alcohol, or iodine. If you have a wound, scrape, corn, or callus on your foot, look at it several times a day to make sure it is healing and not infected. Check for: Redness, swelling, or pain. Fluid or blood. Warmth. Pus or a bad smell. General tips Do not cross your legs. This may decrease blood flow to your feet. Do not use heating pads or hot water bottles on your feet. They may burn your skin. If you have lost feeling in your feet or legs, you may not know this is happening until it is too late. Protect your feet from hot and cold by wearing shoes, such as at the beach or on hot pavement. Schedule a complete foot exam at least once a year (annually) or more often if you have foot problems. Report any cuts, sores, or bruises to your health care provider immediately. Where to find more information American Diabetes Association: www.diabetes.org Association of Diabetes Care & Education Specialists: www.diabeteseducator.org Contact a health care provider if: You have a medical condition that increases your risk of infection and you have any cuts, sores, or bruises on your feet. You have an injury that is not healing. You have redness on your legs or feet. You   feel burning or tingling in your legs or feet. You have pain or cramps in your legs and feet. Your legs or feet are numb. Your feet always feel cold. You have pain around any toenails. Get help right away if: You have a wound, scrape, corn, or callus on your foot and: You have pain, swelling, or redness that gets worse. You have fluid or blood coming from the wound, scrape, corn, or callus. Your wound, scrape, corn, or callus feels warm to the touch. You have pus or a bad smell coming from the wound, scrape, corn, or callus. You have a fever. You have a red  line going up your leg. Summary Check your feet every day for blisters, cuts, bruises, sores, and redness. Apply a moisturizing lotion or petroleum jelly to the skin on your feet and to dry, brittle toenails. Wear shoes that fit properly and have enough cushioning. If you have foot problems, report any cuts, sores, or bruises to your health care provider immediately. Schedule a complete foot exam at least once a year (annually) or more often if you have foot problems. This information is not intended to replace advice given to you by your health care provider. Make sure you discuss any questions you have with your health care provider. Document Revised: 12/11/2019 Document Reviewed: 12/11/2019 Elsevier Patient Education  2023 Elsevier Inc.  

## 2021-10-18 ENCOUNTER — Inpatient Hospital Stay (HOSPITAL_COMMUNITY): Payer: Medicare Other

## 2021-10-18 MED ORDER — DEGARELIX ACETATE 80 MG ~~LOC~~ SOLR: 80.0000 mg | Freq: Once | SUBCUTANEOUS | Status: DC

## 2021-10-19 ENCOUNTER — Other Ambulatory Visit: Payer: Self-pay | Admitting: Internal Medicine

## 2021-10-19 ENCOUNTER — Inpatient Hospital Stay (HOSPITAL_COMMUNITY): Payer: Medicare Other | Attending: Hematology

## 2021-10-19 ENCOUNTER — Encounter (HOSPITAL_COMMUNITY): Payer: Self-pay

## 2021-10-19 ENCOUNTER — Other Ambulatory Visit: Payer: Self-pay | Admitting: Family Medicine

## 2021-10-19 VITALS — BP 108/59 | HR 72 | Temp 96.8°F | Resp 20

## 2021-10-19 DIAGNOSIS — D649 Anemia, unspecified: Secondary | ICD-10-CM | POA: Insufficient documentation

## 2021-10-19 DIAGNOSIS — Z79899 Other long term (current) drug therapy: Secondary | ICD-10-CM | POA: Insufficient documentation

## 2021-10-19 DIAGNOSIS — D696 Thrombocytopenia, unspecified: Secondary | ICD-10-CM | POA: Insufficient documentation

## 2021-10-19 DIAGNOSIS — E039 Hypothyroidism, unspecified: Secondary | ICD-10-CM | POA: Diagnosis not present

## 2021-10-19 DIAGNOSIS — Z5111 Encounter for antineoplastic chemotherapy: Secondary | ICD-10-CM | POA: Insufficient documentation

## 2021-10-19 DIAGNOSIS — C61 Malignant neoplasm of prostate: Secondary | ICD-10-CM | POA: Insufficient documentation

## 2021-10-19 DIAGNOSIS — C778 Secondary and unspecified malignant neoplasm of lymph nodes of multiple regions: Secondary | ICD-10-CM | POA: Diagnosis not present

## 2021-10-19 MED ORDER — DEGARELIX ACETATE 80 MG ~~LOC~~ SOLR
80.0000 mg | Freq: Once | SUBCUTANEOUS | Status: AC
  Administered 2021-10-19: 80 mg via SUBCUTANEOUS
  Filled 2021-10-19: qty 4

## 2021-10-19 NOTE — Patient Instructions (Signed)
[facility]  Discharge Instructions: ?Thank you for choosing [facility] to provide your oncology and hematology care.  ?If you have a lab appointment with the [facility], please come in thru the Main Entrance and check in at the main information desk. ? ?Wear comfortable clothing and clothing appropriate for easy access to any Portacath or PICC line.  ? ?We strive to give you quality time with your provider. You may need to reschedule your appointment if you arrive late (15 or more minutes).  Arriving late affects you and other patients whose appointments are after yours.  Also, if you miss three or more appointments without notifying the office, you may be dismissed from the clinic at the provider?s discretion.    ?  ?For prescription refill requests, have your pharmacy contact our office and allow 72 hours for refills to be completed.   ? ?Today you received the following chemotherapy and/or immunotherapy agents firmagon. ?Degarelix injection ?What is this medication? ?DEGARELIX (deg a REL ix) is used to treat men with advanced prostate cancer. ?This medicine may be used for other purposes; ask your health care provider or pharmacist if you have questions. ?COMMON BRAND NAME(S): Degarelix, Mills Koller ?What should I tell my care team before I take this medication? ?They need to know if you have any of these conditions: ?diabetes ?heart disease ?kidney disease ?liver disease ?low levels of potassium or magnesium in the blood ?osteoporosis ?an unusual or allergic reaction to degarelix, mannitol, other medicines, foods, dyes, or preservatives ?pregnant or trying to get pregnant ?breast-feeding ?How should I use this medication? ?This medicine is for injection under the skin. It is usually given by a health care professional in a hospital or clinic setting. ?If you get this medicine at home, you will be taught how to prepare and give this medicine. Use exactly as directed. Take your  medicine at regular intervals. Do not take it more often than directed. ?It is important that you put your used needles and syringes in a special sharps container. Do not put them in a trash can. If you do not have a sharps container, call your pharmacist or healthcare provider to get one. ?Talk to your pediatrician regarding the use of this medicine in children. Special care may be needed. ?Overdosage: If you think you have taken too much of this medicine contact a poison control center or emergency room at once. ?NOTE: This medicine is only for you. Do not share this medicine with others. ?What if I miss a dose? ?Try not to miss a dose. If you do miss a dose, call your doctor or health care professional for advice. ?What may interact with this medication? ?Do not take this medicine with any of the following medications: ?cisapride ?dronedarone ?pimozide ?thioridazine ?This medicine may also interact with the following medications: ?other medicines that prolong the QT interval (abnormal heart rhythm) ?This list may not describe all possible interactions. Give your health care provider a list of all the medicines, herbs, non-prescription drugs, or dietary supplements you use. Also tell them if you smoke, drink alcohol, or use illegal drugs. Some items may interact with your medicine. ?What should I watch for while using this medication? ?Visit your doctor or health care professional for regular checks on your progress and discuss any issues before you start taking this medicine. ?Do not rub or scratch injection site. There may be a lump at the injection site, or it may be red or sore for a  few days after your dose. ?Your doctor or health care professional will need to monitor your hormone levels in your blood to check your response to treatment. Try to keep any appointments for testing. ?What side effects may I notice from receiving this medication? ?Side effects that you should report to your doctor or health care  professional as soon as possible: ?allergic reactions like skin rash, itching or hives, swelling of the face, lips, or tongue ?fever or chills ?irregular heartbeat ?nausea and vomiting along with severe abdominal pain ?pain or difficulty passing urine ?pelvic pain or bloating ?signs and symptoms of high blood sugar such as being more thirsty or hungry or having to urinate more than normal. You may also feel very tired or have blurry vision ?Side effects that usually do not require medical attention (report to your doctor or health care professional if they continue or are bothersome): ?change in sex drive or performance ?constipation ?headache ?high blood pressure ?hot flashes (flushing of skin, increased sweating) ?itching, redness or mild pain at site where injected ?joint pain ?trouble sleeping ?unusually weak or tired ?weight gain ?This list may not describe all possible side effects. Call your doctor for medical advice about side effects. You may report side effects to FDA at 1-800-FDA-1088. ?Where should I keep my medication? ?Keep out of the reach of children. ?This drug is usually given in a hospital or clinic and will not be stored at home. ?In rare cases, this medicine may be given at home. If you are using this medicine at home, you will be instructed on how to store this medicine. Throw away any unused medicine after the expiration date on the label. ?NOTE: This sheet is a summary. It may not cover all possible information. If you have questions about this medicine, talk to your doctor, pharmacist, or health care provider. ?? 2023 Elsevier/Gold Standard (2021-04-22 00:00:00) ?    ?  ?To help prevent nausea and vomiting after your treatment, we encourage you to take your nausea medication as directed. ? ?BELOW ARE SYMPTOMS THAT SHOULD BE REPORTED IMMEDIATELY: ?*FEVER GREATER THAN 100.4 F (38 ?C) OR HIGHER ?*CHILLS OR SWEATING ?*NAUSEA AND VOMITING THAT IS NOT CONTROLLED WITH YOUR NAUSEA  MEDICATION ?*UNUSUAL SHORTNESS OF BREATH ?*UNUSUAL BRUISING OR BLEEDING ?*URINARY PROBLEMS (pain or burning when urinating, or frequent urination) ?*BOWEL PROBLEMS (unusual diarrhea, constipation, pain near the anus) ?TENDERNESS IN MOUTH AND THROAT WITH OR WITHOUT PRESENCE OF ULCERS (sore throat, sores in mouth, or a toothache) ?UNUSUAL RASH, SWELLING OR PAIN  ?UNUSUAL VAGINAL DISCHARGE OR ITCHING  ? ?Items with * indicate a potential emergency and should be followed up as soon as possible or go to the Emergency Department if any problems should occur. ? ?Please show the CHEMOTHERAPY ALERT CARD or IMMUNOTHERAPY ALERT CARD at check-in to the Emergency Department and triage nurse. ? ?Should you have questions after your visit or need to cancel or reschedule your appointment, please contact [facility] [phone]  and follow the prompts.  Office hours are 8:00 a.m. to 4:30 p.m. Monday - Friday. Please note that voicemails left after 4:00 p.m. may not be returned until the following business day.  We are closed weekends and major holidays. You have access to a nurse at all times for urgent questions. Please call the main number to the clinic [phone] and follow the prompts. ? ?For any non-urgent questions, you may also contact your provider using MyChart. We now offer e-Visits for anyone 10 and older to request  care online for non-urgent symptoms. For details visit [email or web address]. ?  ?Also download the MyChart app! Go to the app store, search "MyChart", open the app, select , and log in with your MyChart username and password. ? ?Due to Covid, a mask is required upon entering the hospital/clinic. If you do not have a mask, one will be given to you upon arrival. For doctor visits, patients may have 1 support person aged 15 or older with them. For treatment visits, patients cannot have anyone with them due to current Covid guidelines and our immunocompromised population.  ?

## 2021-10-19 NOTE — Progress Notes (Signed)
Patient tolerated injection with no complaints voiced.  Site clean and dry with no bruising or swelling noted at site.  See MAR for details.  Band aid applied.  Patient stable during and after injection.  Vss with discharge and left in satisfactory condition with no s/s of distress noted.  

## 2021-10-20 NOTE — Telephone Encounter (Signed)
Former pt of [facility]. Appears pt has new PCP. Requested Prescriptions  Pending Prescriptions Disp Refills  . allopurinol (ZYLOPRIM) 100 MG tablet [Pharmacy Med Name: ALLOPURINOL '100MG'$  TABLETS] 90 tablet 0    Sig: TAKE 1 TABLET(100 MG) BY MOUTH DAILY     Endocrinology:  Gout Agents - allopurinol Failed - 10/19/2021  3:31 PM      Failed - Uric Acid in normal range and within 360 days    Uric Acid, Serum  Date Value Ref Range Status  09/08/2020 4.6 4.0 - 8.0 mg/dL Final    Comment:    Therapeutic target for gout patients: <6.0 mg/dL .          Failed - Cr in normal range and within 360 days    Creatinine  Date Value Ref Range Status  04/17/2016 1.2 0.7 - 1.3 mg/dL Final   Creat  Date Value Ref Range Status  09/08/2020 1.17 0.70 - 1.25 mg/dL Final    Comment:    For patients >9 years of age, the reference limit for Creatinine is approximately 13% higher for people identified as African-American. .    Creatinine, Ser  Date Value Ref Range Status  09/20/2021 1.30 (H) 0.61 - 1.24 mg/dL Final   Creatinine, POC  Date Value Ref Range Status  03/09/2021 300 mg/dL Final   Creatinine, Urine  Date Value Ref Range Status  03/15/2020 206 20 - 320 mg/dL Final         Failed - Valid encounter within last 12 months    Recent Outpatient Visits          1 year ago Acute non-recurrent maxillary sinusitis   [place] Eulogio Bear, NP   1 year ago Essential hypertension   [place], [name], MD   2 years ago Routine general medical examination at a health care facility   [place], [name], MD   3 years ago Coronary artery disease involving native coronary artery of native heart without angina pectoris   [place], [name], MD   3 years ago Neck pain   [place], [name], MD      Future Appointments            In 1 month Derek Jack, MD [facility]   In 2 months D[place]t, Annie Main, MD [place]           Passed - CBC within normal limits and completed in the last 12 months    WBC  Date Value Ref Range Status  09/20/2021 5.7 4.0 - 10.5 K/uL Final   RBC  Date Value Ref Range Status  09/20/2021 3.63 (L) 4.22 - 5.81 MIL/uL Final   Hemoglobin  Date Value Ref Range Status  09/20/2021 10.5 (L) 13.0 - 17.0 g/dL Final   HGB  Date Value Ref Range Status  04/17/2016 15.8 13.0 - 17.1 g/dL Final   HCT  Date Value Ref Range Status  09/20/2021 34.2 (L) 39.0 - 52.0 % Final  04/17/2016 46.1 38.4 - 49.9 % Final   MCHC  Date Value Ref Range Status  09/20/2021 30.7 30.0 - 36.0 g/dL Final   [facility]  Date Value Ref Range Status  09/20/2021 28.9 26.0 - 34.0 pg Final   MCV  Date Value Ref Range Status  09/20/2021 94.2 80.0 - 100.0 fL Final  04/17/2016 93.3 79.3 - 98.0 fL Final  No results found for: PLTCOUNTKUC, LABPLAT, POCPLA RDW  Date Value Ref Range Status  09/20/2021 14.8 11.5 - 15.5 % Final  04/17/2016 14.0 11.0 - 14.6 % Final

## 2021-10-27 ENCOUNTER — Other Ambulatory Visit (HOSPITAL_COMMUNITY): Payer: Self-pay

## 2021-10-27 ENCOUNTER — Other Ambulatory Visit (HOSPITAL_COMMUNITY): Payer: Self-pay | Admitting: Hematology

## 2021-10-28 ENCOUNTER — Other Ambulatory Visit (HOSPITAL_COMMUNITY): Payer: Self-pay

## 2021-10-28 ENCOUNTER — Encounter (HOSPITAL_COMMUNITY): Payer: Self-pay | Admitting: Hematology

## 2021-10-28 MED ORDER — ERLEADA 60 MG PO TABS
ORAL_TABLET | ORAL | 3 refills | Status: DC
  Filled 2021-10-28: qty 120, 30d supply, fill #0
  Filled 2021-11-21: qty 120, 30d supply, fill #1
  Filled 2021-12-20: qty 120, 30d supply, fill #2
  Filled 2022-01-23: qty 120, 30d supply, fill #3

## 2021-11-02 ENCOUNTER — Other Ambulatory Visit (HOSPITAL_COMMUNITY): Payer: Self-pay

## 2021-11-09 ENCOUNTER — Ambulatory Visit (INDEPENDENT_AMBULATORY_CARE_PROVIDER_SITE_OTHER): Payer: Medicare Other

## 2021-11-09 DIAGNOSIS — I495 Sick sinus syndrome: Secondary | ICD-10-CM | POA: Diagnosis not present

## 2021-11-11 LAB — CUP PACEART REMOTE DEVICE CHECK
Battery Remaining Longevity: 50 mo
Battery Remaining Percentage: 39 %
Battery Voltage: 2.96 V
Brady Statistic AP VP Percent: 1 %
Brady Statistic AP VS Percent: 4.2 %
Brady Statistic AS VP Percent: 1 %
Brady Statistic AS VS Percent: 95 %
Brady Statistic RA Percent Paced: 3.9 %
Brady Statistic RV Percent Paced: 1 %
Date Time Interrogation Session: 20230607020015
Implantable Lead Implant Date: 20160415
Implantable Lead Implant Date: 20160415
Implantable Lead Location: 753859
Implantable Lead Location: 753860
Implantable Pulse Generator Implant Date: 20160415
Lead Channel Impedance Value: 430 Ohm
Lead Channel Impedance Value: 590 Ohm
Lead Channel Pacing Threshold Amplitude: 0.75 V
Lead Channel Pacing Threshold Amplitude: 0.75 V
Lead Channel Pacing Threshold Pulse Width: 0.5 ms
Lead Channel Pacing Threshold Pulse Width: 0.5 ms
Lead Channel Sensing Intrinsic Amplitude: 12 mV
Lead Channel Sensing Intrinsic Amplitude: 2.6 mV
Lead Channel Setting Pacing Amplitude: 2 V
Lead Channel Setting Pacing Amplitude: 2.5 V
Lead Channel Setting Pacing Pulse Width: 0.5 ms
Lead Channel Setting Sensing Sensitivity: 2 mV
Pulse Gen Model: 2240
Pulse Gen Serial Number: 7756161

## 2021-11-15 ENCOUNTER — Ambulatory Visit (HOSPITAL_COMMUNITY)
Admission: RE | Admit: 2021-11-15 | Discharge: 2021-11-15 | Disposition: A | Discharge status: Home / Self Care | Payer: Medicare Other | Source: Ambulatory Visit | Attending: Hematology | Admitting: Hematology

## 2021-11-15 ENCOUNTER — Inpatient Hospital Stay (HOSPITAL_COMMUNITY): Payer: Medicare Other | Attending: Hematology

## 2021-11-15 ENCOUNTER — Encounter (HOSPITAL_COMMUNITY): Payer: Self-pay

## 2021-11-15 VITALS — BP 120/66 | HR 74 | Temp 96.9°F | Resp 20 | Wt 291.0 lb

## 2021-11-15 DIAGNOSIS — Z79899 Other long term (current) drug therapy: Secondary | ICD-10-CM | POA: Insufficient documentation

## 2021-11-15 DIAGNOSIS — Z79890 Hormone replacement therapy (postmenopausal): Secondary | ICD-10-CM | POA: Insufficient documentation

## 2021-11-15 DIAGNOSIS — M818 Other osteoporosis without current pathological fracture: Secondary | ICD-10-CM | POA: Insufficient documentation

## 2021-11-15 DIAGNOSIS — Z191 Hormone sensitive malignancy status: Secondary | ICD-10-CM | POA: Diagnosis present

## 2021-11-15 DIAGNOSIS — Z5111 Encounter for antineoplastic chemotherapy: Secondary | ICD-10-CM | POA: Insufficient documentation

## 2021-11-15 DIAGNOSIS — Z87310 Personal history of (healed) osteoporosis fracture: Secondary | ICD-10-CM | POA: Insufficient documentation

## 2021-11-15 DIAGNOSIS — M8588 Other specified disorders of bone density and structure, other site: Secondary | ICD-10-CM | POA: Diagnosis not present

## 2021-11-15 DIAGNOSIS — E58 Dietary calcium deficiency: Secondary | ICD-10-CM | POA: Insufficient documentation

## 2021-11-15 DIAGNOSIS — C61 Malignant neoplasm of prostate: Secondary | ICD-10-CM | POA: Insufficient documentation

## 2021-11-15 DIAGNOSIS — M47816 Spondylosis without myelopathy or radiculopathy, lumbar region: Secondary | ICD-10-CM | POA: Insufficient documentation

## 2021-11-15 MED ORDER — DEGARELIX ACETATE 80 MG ~~LOC~~ SOLR
80.0000 mg | Freq: Once | SUBCUTANEOUS | Status: AC
  Administered 2021-11-15: 80 mg via SUBCUTANEOUS
  Filled 2021-11-15: qty 4

## 2021-11-15 NOTE — Progress Notes (Signed)
Patient presents today for Firmagon injection. Vital signs stable. Patient has no complaints voiced.   Peter Dunlap presents today for injection per the provider's orders.  Firmagon administration without incident; injection site WNL; see MAR for injection details.  Patient tolerated procedure well and without incident.  No questions or complaints noted at this time. Discharged from clinic ambulatory in stable condition. Alert and oriented x 3. F/U with [facility] as scheduled.

## 2021-11-15 NOTE — Patient Instructions (Addendum)
[facility]  Discharge Instructions: Thank you for choosing [facility] to provide your oncology and hematology care.  If you have a lab appointment with the [facility], please come in thru the Main Entrance and check in at the main information desk.  Wear comfortable clothing and clothing appropriate for easy access to any Portacath or PICC line.   We strive to give you quality time with your provider. You may need to reschedule your appointment if you arrive late (15 or more minutes).  Arriving late affects you and other patients whose appointments are after yours.  Also, if you miss three or more appointments without notifying the office, you may be dismissed from the clinic at the provider's discretion.      For prescription refill requests, have your pharmacy contact our office and allow 72 hours for refills to be completed.    Today you received the following chemotherapy and/or immunotherapy agents Firmagon injection.       To help prevent nausea and vomiting after your treatment, we encourage you to take your nausea medication as directed.  BELOW ARE SYMPTOMS THAT SHOULD BE REPORTED IMMEDIATELY: *FEVER GREATER THAN 100.4 F (38 C) OR HIGHER *CHILLS OR SWEATING *NAUSEA AND VOMITING THAT IS NOT CONTROLLED WITH YOUR NAUSEA MEDICATION *UNUSUAL SHORTNESS OF BREATH *UNUSUAL BRUISING OR BLEEDING *URINARY PROBLEMS (pain or burning when urinating, or frequent urination) *BOWEL PROBLEMS (unusual diarrhea, constipation, pain near the anus) TENDERNESS IN MOUTH AND THROAT WITH OR WITHOUT PRESENCE OF ULCERS (sore throat, sores in mouth, or a toothache) UNUSUAL RASH, SWELLING OR PAIN  UNUSUAL VAGINAL DISCHARGE OR ITCHING   Items with * indicate a potential emergency and should be followed up as soon as possible or go to the Emergency Department if any problems should occur.  Please show the CHEMOTHERAPY ALERT CARD or IMMUNOTHERAPY ALERT CARD at check-in to the  Emergency Department and triage nurse.  Should you have questions after your visit or need to cancel or reschedule your appointment, please contact [facility] [phone]  and follow the prompts.  Office hours are 8:00 a.m. to 4:30 p.m. Monday - Friday. Please note that voicemails left after 4:00 p.m. may not be returned until the following business day.  We are closed weekends and major holidays. You have access to a nurse at all times for urgent questions. Please call the main number to the clinic [phone] and follow the prompts.  For any non-urgent questions, you may also contact your provider using MyChart. We now offer e-Visits for anyone 49 and older to request care online for non-urgent symptoms. For details visit [email or web address].   Also download the MyChart app! Go to the app store, search "MyChart", open the app, select [facility], and log in with your MyChart username and password.  Masks are optional in the cancer centers. If you would like for your care team to wear a mask while they are taking care of you, please let them know. For doctor visits, patients may have with them one support person who is at least 66 years old. At this time, visitors are not allowed in the infusion area. Degarelix injection What is this medication? DEGARELIX (deg a REL ix) is used to treat men with advanced prostate cancer. This medicine may be used for other purposes; ask your health care provider or pharmacist if you have questions. COMMON BRAND NAME(S): Degarelix, Mills Koller What should I tell my care team before I take this medication? They need  to know if you have any of these conditions: diabetes heart disease kidney disease liver disease low levels of potassium or magnesium in the blood osteoporosis an unusual or allergic reaction to degarelix, mannitol, other medicines, foods, dyes, or preservatives pregnant or trying to get pregnant breast-feeding How should I use  this medication? This medicine is for injection under the skin. It is usually given by a health care professional in a hospital or clinic setting. If you get this medicine at home, you will be taught how to prepare and give this medicine. Use exactly as directed. Take your medicine at regular intervals. Do not take it more often than directed. It is important that you put your used needles and syringes in a special sharps container. Do not put them in a trash can. If you do not have a sharps container, call your pharmacist or healthcare provider to get one. Talk to your pediatrician regarding the use of this medicine in children. Special care may be needed. Overdosage: If you think you have taken too much of this medicine contact a poison control center or emergency room at once. NOTE: This medicine is only for you. Do not share this medicine with others. What if I miss a dose? Try not to miss a dose. If you do miss a dose, call your doctor or health care professional for advice. What may interact with this medication? Do not take this medicine with any of the following medications: cisapride dronedarone pimozide thioridazine This medicine may also interact with the following medications: other medicines that prolong the QT interval (abnormal heart rhythm) This list may not describe all possible interactions. Give your health care provider a list of all the medicines, herbs, non-prescription drugs, or dietary supplements you use. Also tell them if you smoke, drink alcohol, or use illegal drugs. Some items may interact with your medicine. What should I watch for while using this medication? Visit your doctor or health care professional for regular checks on your progress and discuss any issues before you start taking this medicine. Do not rub or scratch injection site. There may be a lump at the injection site, or it may be red or sore for a few days after your dose. Your doctor or health care  professional will need to monitor your hormone levels in your blood to check your response to treatment. Try to keep any appointments for testing. What side effects may I notice from receiving this medication? Side effects that you should report to your doctor or health care professional as soon as possible: allergic reactions like skin rash, itching or hives, swelling of the face, lips, or tongue fever or chills irregular heartbeat nausea and vomiting along with severe abdominal pain pain or difficulty passing urine pelvic pain or bloating signs and symptoms of high blood sugar such as being more thirsty or hungry or having to urinate more than normal. You may also feel very tired or have blurry vision Side effects that usually do not require medical attention (report to your doctor or health care professional if they continue or are bothersome): change in sex drive or performance constipation headache high blood pressure hot flashes (flushing of skin, increased sweating) itching, redness or mild pain at site where injected joint pain trouble sleeping unusually weak or tired weight gain This list may not describe all possible side effects. Call your doctor for medical advice about side effects. You may report side effects to FDA at 1-800-FDA-1088. Where should I keep my medication?  Keep out of the reach of children. This drug is usually given in a hospital or clinic and will not be stored at home. In rare cases, this medicine may be given at home. If you are using this medicine at home, you will be instructed on how to store this medicine. Throw away any unused medicine after the expiration date on the label. NOTE: This sheet is a summary. It may not cover all possible information. If you have questions about this medicine, talk to your doctor, pharmacist, or health care provider.  2023 Elsevier/Gold Standard (2021-04-22 00:00:00)

## 2021-11-18 NOTE — Progress Notes (Signed)
Remote pacemaker transmission.   

## 2021-11-21 ENCOUNTER — Other Ambulatory Visit (HOSPITAL_COMMUNITY): Payer: Self-pay

## 2021-12-01 ENCOUNTER — Other Ambulatory Visit (HOSPITAL_COMMUNITY): Payer: Self-pay

## 2021-12-02 ENCOUNTER — Inpatient Hospital Stay (HOSPITAL_COMMUNITY): Payer: Medicare Other

## 2021-12-05 ENCOUNTER — Inpatient Hospital Stay (HOSPITAL_COMMUNITY): Payer: Medicare Other

## 2021-12-08 ENCOUNTER — Inpatient Hospital Stay (HOSPITAL_COMMUNITY): Payer: Medicare Other | Attending: Hematology

## 2021-12-08 DIAGNOSIS — D696 Thrombocytopenia, unspecified: Secondary | ICD-10-CM | POA: Diagnosis not present

## 2021-12-08 DIAGNOSIS — C775 Secondary and unspecified malignant neoplasm of intrapelvic lymph nodes: Secondary | ICD-10-CM | POA: Diagnosis present

## 2021-12-08 DIAGNOSIS — Z5111 Encounter for antineoplastic chemotherapy: Secondary | ICD-10-CM | POA: Diagnosis not present

## 2021-12-08 DIAGNOSIS — Z87891 Personal history of nicotine dependence: Secondary | ICD-10-CM | POA: Insufficient documentation

## 2021-12-08 DIAGNOSIS — Z7982 Long term (current) use of aspirin: Secondary | ICD-10-CM | POA: Diagnosis not present

## 2021-12-08 DIAGNOSIS — M8580 Other specified disorders of bone density and structure, unspecified site: Secondary | ICD-10-CM | POA: Diagnosis not present

## 2021-12-08 DIAGNOSIS — C61 Malignant neoplasm of prostate: Secondary | ICD-10-CM | POA: Insufficient documentation

## 2021-12-08 DIAGNOSIS — D649 Anemia, unspecified: Secondary | ICD-10-CM | POA: Insufficient documentation

## 2021-12-08 DIAGNOSIS — E039 Hypothyroidism, unspecified: Secondary | ICD-10-CM | POA: Diagnosis not present

## 2021-12-08 DIAGNOSIS — R350 Frequency of micturition: Secondary | ICD-10-CM | POA: Diagnosis not present

## 2021-12-08 DIAGNOSIS — Z79899 Other long term (current) drug therapy: Secondary | ICD-10-CM | POA: Insufficient documentation

## 2021-12-08 LAB — CBC WITH DIFFERENTIAL/PLATELET
Abs Immature Granulocytes: 0.02 10*3/uL (ref 0.00–0.07)
Basophils Absolute: 0.1 10*3/uL (ref 0.0–0.1)
Basophils Relative: 1 %
Eosinophils Absolute: 0.1 10*3/uL (ref 0.0–0.5)
Eosinophils Relative: 1 %
HCT: 29 % — ABNORMAL LOW (ref 39.0–52.0)
Hemoglobin: 8.9 g/dL — ABNORMAL LOW (ref 13.0–17.0)
Immature Granulocytes: 0 %
Lymphocytes Relative: 25 %
Lymphs Abs: 1.7 10*3/uL (ref 0.7–4.0)
MCH: 26.8 pg (ref 26.0–34.0)
MCHC: 30.7 g/dL (ref 30.0–36.0)
MCV: 87.3 fL (ref 80.0–100.0)
Monocytes Absolute: 0.9 10*3/uL (ref 0.1–1.0)
Monocytes Relative: 14 %
Neutro Abs: 4 10*3/uL (ref 1.7–7.7)
Neutrophils Relative %: 59 %
Platelets: 136 10*3/uL — ABNORMAL LOW (ref 150–400)
RBC: 3.32 MIL/uL — ABNORMAL LOW (ref 4.22–5.81)
RDW: 17.2 % — ABNORMAL HIGH (ref 11.5–15.5)
WBC: 6.7 10*3/uL (ref 4.0–10.5)
nRBC: 0 % (ref 0.0–0.2)

## 2021-12-08 LAB — FERRITIN: Ferritin: 9 ng/mL — ABNORMAL LOW (ref 24–336)

## 2021-12-08 LAB — COMPREHENSIVE METABOLIC PANEL
ALT: 8 U/L (ref 0–44)
AST: 17 U/L (ref 15–41)
Albumin: 3.5 g/dL (ref 3.5–5.0)
Alkaline Phosphatase: 70 U/L (ref 38–126)
Anion gap: 7 (ref 5–15)
BUN: 15 mg/dL (ref 8–23)
CO2: 20 mmol/L — ABNORMAL LOW (ref 22–32)
Calcium: 9.2 mg/dL (ref 8.9–10.3)
Chloride: 113 mmol/L — ABNORMAL HIGH (ref 98–111)
Creatinine, Ser: 1.02 mg/dL (ref 0.61–1.24)
GFR, Estimated: >60 mL/min (ref >60)
Glucose, Bld: 99 mg/dL (ref 70–99)
Potassium: 4.2 mmol/L (ref 3.5–5.1)
Sodium: 140 mmol/L (ref 135–145)
Total Bilirubin: 0.5 mg/dL (ref 0.3–1.2)
Total Protein: 7.4 g/dL (ref 6.5–8.1)

## 2021-12-08 LAB — IRON AND TIBC
Iron: 32 ug/dL — ABNORMAL LOW (ref 45–182)
Saturation Ratios: 6 % — ABNORMAL LOW (ref 17.9–39.5)
TIBC: 524 ug/dL — ABNORMAL HIGH (ref 250–450)
UIBC: 492 ug/dL

## 2021-12-08 LAB — VITAMIN B12: Vitamin B-12: 723 pg/mL (ref 180–914)

## 2021-12-08 LAB — FOLATE: Folate: 12.2 ng/mL (ref >5.9)

## 2021-12-08 LAB — PSA: Prostatic Specific Antigen: 0.01 ng/mL (ref 0.00–4.00)

## 2021-12-08 LAB — MAGNESIUM: Magnesium: 2.2 mg/dL (ref 1.7–2.4)

## 2021-12-13 ENCOUNTER — Other Ambulatory Visit: Payer: Self-pay

## 2021-12-13 ENCOUNTER — Encounter (HOSPITAL_COMMUNITY): Payer: Self-pay | Admitting: Hematology

## 2021-12-13 ENCOUNTER — Inpatient Hospital Stay (HOSPITAL_COMMUNITY): Payer: Medicare Other

## 2021-12-13 ENCOUNTER — Inpatient Hospital Stay (HOSPITAL_BASED_OUTPATIENT_CLINIC_OR_DEPARTMENT_OTHER): Payer: Medicare Other | Admitting: Hematology

## 2021-12-13 VITALS — BP 97/67 | HR 98 | Temp 97.9°F | Resp 18 | Ht 70.47 in | Wt 288.8 lb

## 2021-12-13 DIAGNOSIS — C61 Malignant neoplasm of prostate: Secondary | ICD-10-CM | POA: Diagnosis not present

## 2021-12-13 DIAGNOSIS — Z191 Hormone sensitive malignancy status: Secondary | ICD-10-CM | POA: Diagnosis not present

## 2021-12-13 DIAGNOSIS — Z5111 Encounter for antineoplastic chemotherapy: Secondary | ICD-10-CM | POA: Diagnosis not present

## 2021-12-13 MED ORDER — DEGARELIX ACETATE 80 MG ~~LOC~~ SOLR
80.0000 mg | Freq: Once | SUBCUTANEOUS | Status: AC
  Administered 2021-12-13: 80 mg via SUBCUTANEOUS
  Filled 2021-12-13: qty 4

## 2021-12-13 NOTE — Patient Instructions (Addendum)
[place] at [facility] Discharge Instructions  You were seen today by Dr. Delton Coombes. He went over your recent results. You will be scheduled for 2-3 infusions of IV iron. This will improve your energy. Dr. Delton Coombes will see you back in 3 months for labs and follow up.   Thank you for choosing [facility] at [facility] to provide your oncology and hematology care.  To afford each patient quality time with our provider, please arrive at least 15 minutes before your scheduled appointment time.   If you have a lab appointment with the [facility] please come in thru the Main Entrance and check in at the main information desk  You need to re-schedule your appointment should you arrive 10 or more minutes late.  We strive to give you quality time with our providers, and arriving late affects you and other patients whose appointments are after yours.  Also, if you no show three or more times for appointments you may be dismissed from the clinic at the providers discretion.     Again, thank you for choosing [facility].  Our hope is that these requests will decrease the amount of time that you wait before being seen by our physicians.       _____________________________________________________________  Should you have questions after your visit to [facility], please contact our office at [phone] between the hours of 8:00 a.m. and 4:30 p.m.  Voicemails left after 4:00 p.m. will not be returned until the following business day.  For prescription refill requests, have your pharmacy contact our office and allow 72 hours.    Cancer Center Support Programs:   > Cancer Support Group  2nd Tuesday of the month 1pm-2pm, Journey Room

## 2021-12-13 NOTE — Progress Notes (Signed)
[facility] [address], [place] [address]   CLINIC:  Medical Oncology/Hematology  PCP:  Eliezer Lofts, MD [address] 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 A variant of uncertain significance (VUS) was detected in the NF1 gene called c.1178A>G. The report date is 05/23/2020.  The Common Hereditary Cancers Panel offered by [facility] includes sequencing and/or deletion duplication testing of the following 47 genes: APC, ATM, AXIN2, BARD1, BMPR1A, BRCA1, BRCA2, BRIP1, CDH1, CDK4, CDKN2A (p14ARF), CDKN2A (p16INK4a), CHEK2, CTNNA1, DICER1, EPCAM (Deletion/duplication testing only), GREM1 (promoter region deletion/duplication testing only), KIT, MEN1, MLH1, MSH2, MSH3, MSH6, MUTYH, NBN, NF1, NTHL1, PALB2, PDGFRA, PMS2, POLD1, POLE, PTEN, RAD50, RAD51C, RAD51D, SDHB, SDHC, SDHD, SMAD4, SMARCA4. STK11, TP53, TSC1, TSC2, and VHL.  The following genes were evaluated for sequence changes only: SDHA and HOXB13 c.251G>A variant only. The Prostate Cancer HRR Panel offered by [facility] includes sequencing and/or deletion duplication analysis of the following 10 genes: ATM, BARD1, BRCA1, BRCA2, BRIP1, CHEK2, FANCL, PALB2, RAD51C, RAD51D.     CANCER STAGING: Cancer Staging  No matching staging information was found for the patient.  INTERVAL HISTORY:  [name], a 66 y.o. male, returns for routine follow-up of his metastatic castration sensitive  prostate cancer. Peter Dunlap was last seen on 09/20/2021.   Today he reports feeling well. He reports reduced urine stream and reduced frequency of urination. He continues to take [place], and he reports tolerating it well. He reports constant fatigue. He denies CP. He is taking 50,000 units of vitamin D once a week. He has 4-5 teeth remaining.   REVIEW OF SYSTEMS:  Review of Systems  Constitutional:  Positive for fatigue. Negative for appetite change.  Cardiovascular:  Negative for chest pain.  Genitourinary:  Positive for difficulty urinating.   All other systems reviewed and are negative.   PAST MEDICAL/SURGICAL HISTORY:  Past Medical History:  Diagnosis Date   Arteriosclerotic cardiovascular disease (ASCVD) 2005   catheterization in 10/2010:50% mid LAD, diffuse distal disease, circumflex irregularities, large dominant RCA with a 50% ostial, 70% distal, 60% posterolateral and 70% PDA; normal EF   Arthritis    Benign prostatic hypertrophy    Bilateral carpal tunnel syndrome 07/03/2018   Cerebrovascular disease 2010   R. carotid endarterectomy; Duplex in 10/2010-widely patent ICAs, subtotal left vertebral-not thought to be contributing to symptoms   Cervical spine disease    CT in 2012-advanced degeneration and spondylosis with moderate spinal stenosis at C3-C6   CHF (congestive heart failure) (HCC)    Depression    Diabetes mellitus without complication (HCC)    Erectile dysfunction    Family history of breast cancer    Family history of cancer of mouth    Family history of CML (chronic monocytic leukemia)    Family history of lung cancer    Family history of ovarian cancer    Family history of stomach cancer    Gastroesophageal reflux disease    H/O hiatal hernia    H/O: substance  abuse (HCC)    Cocaine, marijuana, alcohol.  Quit 2013.    Hyperlipidemia    Hypertension    Non-ST elevation myocardial infarction (NSTEMI), initial episode of care ([place]) 12/02/2013   DES LAD    Obesity    Prostate cancer ([place])    Sleep apnea    CPAP   Tachy-brady syndrome ([place])    a. 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 25, 2010    CEA   COLONOSCOPY WITH PROPOFOL N/A 06/24/2021   Procedure: COLONOSCOPY WITH PROPOFOL;  Surgeon: Carol Ada, MD;  Location: [facility] ENDOSCOPY;  Service: Endoscopy;  Laterality: N/A;   CORONARY ANGIOPLASTY WITH STENT PLACEMENT  12/03/2013   LAD 90%-->0% W/ Promus Premier DES 3.0 mm x 16 mm, CFX OK, RCA 40%, EF 70-75%   LEFT ATRIAL APPENDAGE OCCLUSION N/A 08/05/2015   Procedure: LEFT ATRIAL APPENDAGE OCCLUSION;  Surgeon: [name], MD;  Location: [facility] CV LAB;  Service: Cardiovascular;  Laterality: N/A;   LEFT HEART CATH AND CORONARY ANGIOGRAPHY N/A 09/13/2021   Procedure: LEFT HEART CATH AND CORONARY ANGIOGRAPHY;  Surgeon: Early Osmond, MD;  Location: [facility] CV LAB;  Service: Cardiovascular;  Laterality: N/A;   LEFT HEART CATHETERIZATION WITH CORONARY ANGIOGRAM Left 12/03/2013   Procedure: LEFT HEART CATHETERIZATION WITH CORONARY ANGIOGRAM;  Surgeon: Leonie Man, MD;  Location: [facility] CATH LAB;  Service: Cardiovascular;  Laterality: Left;   LEFT HEART CATHETERIZATION WITH CORONARY ANGIOGRAM N/A 01/26/2014   Procedure: LEFT HEART CATHETERIZATION WITH CORONARY ANGIOGRAM;  Surgeon: Jettie Booze, MD;  Location: [facility] CATH LAB;  Service: Cardiovascular;  Laterality: N/A;   LEFT HEART CATHETERIZATION WITH CORONARY ANGIOGRAM N/A 08/03/2014   Procedure: LEFT HEART CATHETERIZATION WITH CORONARY ANGIOGRAM;  Surgeon: Burnell Blanks, MD;  Location: [facility] CATH LAB;  Service:  Cardiovascular;  Laterality: N/A;   PERCUTANEOUS CORONARY STENT INTERVENTION (PCI-S)  12/03/2013   Procedure: PERCUTANEOUS CORONARY STENT INTERVENTION (PCI-S);  Surgeon: Leonie Man, MD;  Location: [facility] CATH LAB;  Service: Cardiovascular;;   PERMANENT PACEMAKER INSERTION N/A 09/18/2014   Procedure: PERMANENT PACEMAKER INSERTION;  Surgeon: [name], MD;  Location: [facility] CATH LAB;  Service: Cardiovascular;  Laterality: N/A;   POLYPECTOMY  06/24/2021   Procedure: POLYPECTOMY;  Surgeon: Carol Ada, MD;  Location: [facility] ENDOSCOPY;  Service: Endoscopy;;   RADIOFREQUENCY ABLATION  2005   for PSVT   TEE WITHOUT CARDIOVERSION N/A 07/27/2015   Procedure: TRANSESOPHAGEAL ECHOCARDIOGRAM (TEE);  Surgeon: Lelon Perla, MD;  Location: [facility] ENDOSCOPY;  Service: Cardiovascular;  Laterality: N/A;   TEE WITHOUT CARDIOVERSION N/A 09/15/2015   Procedure: TRANSESOPHAGEAL ECHOCARDIOGRAM (TEE);  Surgeon: Thayer Headings, MD;  Location: [facility] ENDOSCOPY;  Service: Cardiovascular;  Laterality: N/A;    SOCIAL HISTORY:  Social History   Socioeconomic History   Marital status: Married    Spouse name: Not on file   Number of children: 0   Years of education: Not on file   Highest education level: Not on file  Occupational History   Occupation: Retired  Tobacco Use   Smoking status: Former  Packs/day: 1.00    Years: 40.00    Total pack years: 40.00    Types: Cigarettes    Start date: 10/20/1972    Quit date: 10/10/2012    Years since quitting: 9.1   Smokeless tobacco: Never   Tobacco comments:    Quit in May.   Vaping Use   Vaping Use: Never used  Substance and Sexual Activity   Alcohol use: Yes    Alcohol/week: 0.0 standard drinks of alcohol    Comment: former drinker-- sober since 2013.    Drug use: No    Types: Cocaine    Comment: quit cocaine 10/2011   Sexual activity: Yes    Partners: Female  Other Topics Concern   Not on file  Social History Narrative   Lives in [place].   Social  Determinants of Health   Financial Resource Strain: Low Risk  (04/19/2020)   Overall Financial Resource Strain (CARDIA)    Difficulty of Paying Living Expenses: Not hard at all  Food Insecurity: No Food Insecurity (04/19/2020)   Hunger Vital Sign    Worried About Running Out of Food in the Last Year: Never true    Ran Out of Food in the Last Year: Never true  Transportation Needs: No Transportation Needs (04/19/2020)   PRAPARE - Hydrologist (Medical): No    Lack of Transportation (Non-Medical): No  Physical Activity: Inactive (04/19/2020)   Exercise Vital Sign    Days of Exercise per Week: 0 days    Minutes of Exercise per Session: 0 min  Stress: No Stress Concern Present (04/19/2020)   [place]    Feeling of Stress : Not at all  Social Connections: Moderately Isolated (04/19/2020)   Social Connection and Isolation Panel [NHANES]    Frequency of Communication with Friends and Family: More than three times a week    Frequency of Social Gatherings with Friends and Family: Once a week    Attends Religious Services: Never    Marine scientist or Organizations: No    Attends Archivist Meetings: Never    Marital Status: Married  Human resources officer Violence: Not At Risk (04/19/2020)   Humiliation, Afraid, Rape, and Kick questionnaire    Fear of Current or Ex-Partner: No    Emotionally Abused: No    Physically Abused: No    Sexually Abused: No    FAMILY HISTORY:  Family History  Problem Relation Age of Onset   Hypertension Mother        Cerebrovascular disease   Diabetes Mother    Coronary artery disease Father 29   Diabetes type II Father    Hypertension Father    Heart attack Father    Diabetes Brother    Hypertension Brother    Diabetes Sister    Hypertension Sister    Heart attack Sister 10   Leukemia Sister 1       CML   Breast cancer Maternal Grandmother         dx 29s   Lung cancer Maternal Uncle        dx >50, smoker   Cancer Cousin        mouth cancer, dx 29s, no smoking/chew tobacco hx (maternal 1st cousin)   Ovarian cancer Cousin        dx <50 (maternal 1st cousin)   Stomach cancer Cousin        dx 92s (maternal 1st cousin)  Cancer Cousin        type unknown to pt, dx >50 (paternal 1st cousin)    CURRENT MEDICATIONS:  Current Outpatient Medications  Medication Sig Dispense Refill   allopurinol (ZYLOPRIM) 100 MG tablet TAKE 1 TABLET(100 MG) BY MOUTH DAILY 90 tablet 0   apalutamide (ERLEADA) 60 MG tablet TAKE 4 TABLETS (240 MG TOTAL) BY MOUTH DAILY. MAY BE TAKEN WITH OR WITHOUT FOOD. SWALLOW TABLETS WHOLE. 120 tablet 3   aspirin EC 325 MG tablet Take 1 tablet (325 mg total) daily by mouth. 30 tablet 0   Blood Glucose Monitoring Suppl (BLOOD GLUCOSE SYSTEM PAK) KIT Use as directed to monitor FSBS 1x daily. Dx: E11.9. 1 kit 1   Cyanocobalamin (VITAMIN B 12) 500 MCG TABS Take 500 mcg by mouth in the morning.     diclofenac Sodium (VOLTAREN) 1 % GEL 2 g 2 (two) times daily.     FARXIGA 5 MG TABS tablet Take 5 mg by mouth daily.     fluticasone (FLONASE) 50 MCG/ACT nasal spray Place 2 sprays into both nostrils daily. 16 g 6   furosemide (LASIX) 20 MG tablet TAKE 3 TABLETS BY MOUTH DAILY 270 tablet 3   glipiZIDE (GLUCOTROL XL) 5 MG 24 hr tablet Take 1 tablet (5 mg total) by mouth daily with breakfast. 90 tablet 3   glucose blood (ONETOUCH ULTRA) test strip Use as instructed to monitor glucose twice daily. 100 strip 3   isosorbide mononitrate (IMDUR) 30 MG 24 hr tablet Take 1 tablet (30 mg total) by mouth daily. 90 tablet 3   Lactulose 20 GM/30ML SOLN Take 30 mLs (20 g total) by mouth daily as needed. Take 30 ml by mouth every 3 hours until you have bowel movement then daily as needed 450 mL 3   levothyroxine (SYNTHROID) 200 MCG tablet Take 1 tablet (200 mcg total) by mouth daily before breakfast. 90 tablet 3   levothyroxine (SYNTHROID) 25  MCG tablet Take 1 tablet (25 mcg total) by mouth See admin instructions. Takes along with 200 mcg to total 225 mcg daily 90 tablet 3   loratadine (CLARITIN) 10 MG tablet TAKE 1 TABLET(10 MG) BY MOUTH DAILY AS NEEDED FOR ALLERGIES 30 tablet 2   magnesium oxide (MAG-OX) 400 (240 Mg) MG tablet TAKE 1 TABLET(400 MG) BY MOUTH TWICE DAILY 180 tablet 2   metFORMIN (GLUCOPHAGE) 500 MG tablet Take 1 tablet (500 mg total) by mouth daily with breakfast. 90 tablet 3   naloxone (NARCAN) nasal spray 4 mg/0.1 mL Place 4 mg into the nose once as needed (accidental overdose).     nitroGLYCERIN (NITROSTAT) 0.4 MG SL tablet PLACE 1 TABLET UNDER THE TONGUE EVERY 5 MINUTES AS NEEDED FOR CHEST PAIN. CALL 911 AT THIRD DOSE IN 15 MINUTES 25 tablet 1   oxyCODONE-acetaminophen (PERCOCET) 10-325 MG tablet Take 1 tablet by mouth every 8 (eight) hours as needed for pain. 90 tablet 0   pantoprazole (PROTONIX) 40 MG tablet TAKE 1 TABLET(40 MG) BY MOUTH DAILY 30 tablet 3   potassium chloride SA (KLOR-CON M) 20 MEQ tablet Take 1 tablet (20 mEq total) by mouth daily. 30 tablet 3   rosuvastatin (CRESTOR) 40 MG tablet Take 40 mg by mouth every evening.     silodosin (RAPAFLO) 8 MG CAPS capsule TAKE 1 CAPSULE(8 MG) BY MOUTH TWICE DAILY 180 capsule 3   solifenacin (VESICARE) 10 MG tablet Take 1 tablet (10 mg total) by mouth daily. 30 tablet 11   sotalol (BETAPACE) 80 MG  tablet Take 1 tablet (80 mg total) by mouth 2 (two) times daily. 180 tablet 3   Vitamin D, Ergocalciferol, (DRISDOL) 1.25 MG (50000 UNIT) CAPS capsule TAKE 1 CAPSULE BY MOUTH EVERY 7 DAYS 12 capsule 0   No current facility-administered medications for this visit.    ALLERGIES:  Allergies  Allergen Reactions   Trazodone And Nefazodone     Nightmares   Lactose Intolerance (Gi) Other (See Comments)    UPSET STOMACH     PHYSICAL EXAM:  Performance status (ECOG): 1 - Symptomatic but completely ambulatory  There were no vitals filed for this visit. Wt Readings  from Last 3 Encounters:  11/15/21 291 lb (132 kg)  10/10/21 293 lb (132.9 kg)  10/05/21 295 lb 9.6 oz (134.1 kg)   Physical Exam Vitals reviewed.  Constitutional:      Appearance: Normal appearance. He is obese.  Cardiovascular:     Rate and Rhythm: Normal rate and regular rhythm.     Pulses: Normal pulses.     Heart sounds: Normal heart sounds.  Pulmonary:     Effort: Pulmonary effort is normal.     Breath sounds: Normal breath sounds.  Neurological:     General: No focal deficit present.     Mental Status: He is alert and oriented to person, place, and time.  Psychiatric:        Mood and Affect: Mood normal.        Behavior: Behavior normal.      LABORATORY DATA:  I have reviewed the labs as listed.     Latest Ref Rng & Units 12/08/2021    1:29 PM 09/20/2021    9:05 AM 09/14/2021    8:40 AM  CBC  WBC 4.0 - 10.5 K/uL 6.7  5.7  5.4   Hemoglobin 13.0 - 17.0 g/dL 8.9  10.5  11.0   Hematocrit 39.0 - 52.0 % 29.0  34.2  33.5   Platelets 150 - 400 K/uL 136  145  134       Latest Ref Rng & Units 12/08/2021    1:29 PM 09/20/2021    9:05 AM 09/16/2021   10:53 AM  CMP  Glucose 70 - 99 mg/dL 99  125  136   BUN 8 - 23 mg/dL $Remove'15  19  19   'CJffeUU$ Creatinine 0.61 - 1.24 mg/dL 1.02  1.30  1.36   Sodium 135 - 145 mmol/L 140  137  138   Potassium 3.5 - 5.1 mmol/L 4.2  4.8  4.9   Chloride 98 - 111 mmol/L 113  107  101   CO2 22 - 32 mmol/L $RemoveB'20  22  24   'utzouzVn$ Calcium 8.9 - 10.3 mg/dL 9.2  9.2  9.4   Total Protein 6.5 - 8.1 g/dL 7.4  7.4    Total Bilirubin 0.3 - 1.2 mg/dL 0.5  0.3    Alkaline Phos 38 - 126 U/L 70  76    AST 15 - 41 U/L 17  18    ALT 0 - 44 U/L 8  10      DIAGNOSTIC IMAGING:  I have independently reviewed the scans and discussed with the patient. DG Bone Density  Result Date: 11/15/2021 EXAM: DUAL X-RAY ABSORPTIOMETRY (DXA) FOR BONE MINERAL DENSITY IMPRESSION: Your patient Peter Dunlap completed a BMD test on 11/15/2021 using the [employer] (software version:  14.10) manufactured by [employer]. The following summarizes the results of our evaluation. Technologist: [facility] PATIENT BIOGRAPHICAL: Name:  Peter Dunlap, Peter Dunlap Patient ID: [id number] Birth Date: 1956/05/13 Height: 72.0 in. Gender: Male Exam Date: 11/15/2021 Weight: 280.0 lbs. Indications: Low Calcium Intake, Prostate Cancer, Secondary Osteoporosis Fractures: Treatments: Synthroid DENSITOMETRY RESULTS: Site      Region      Measured Date Measured Age WHO Classification Young Adult T-score BMD         %Change vs. Previous Significant Change (*) AP Spine L1-L2 11/15/20[age].0 N/A 0.5 1.258 g/cm2 - - DualFemur Total Right 11/15/2021 66.0 N/A -1.6 0.877 g/cm2 - - DualFemur Total Mean 11/15/2021 66.0 N/A -1.1 0.938 g/cm2 - - ASSESSMENT: BMD as determined from Femur Total Right is 0.877 g/cm2 with a T-score of -1.6. This patient is considered osteopenic by World Healh Organization (WHO) Criteria. The scan quality is good. L3 and L4 were excluded due to advanced degenerative changes. World Science writer [facility]) criteria for post-menopausal, Caucasian Women: Normal:       T-score at or above -1 SD Osteopenia:   T-score between -1 and -2.5 SD Osteoporosis: T-score at or below -2.5 SD RECOMMENDATIONS: 1. All patients should optimize calcium and vitamin D intake. 2. Consider FDA-approved medical therapies in postmenopausal women and med aged 80 years and older, based on the following: a. A hip or vertebral (clinical or morphometric) fracture b. T-score< -2.5 at the femoral neck or spine after appropriate evaluation to exclude secondary causes c. Low bone mass (T-score between -1.0 and -2.5 at the femoral neck or spine) and a 10-year probability of a hip fracture > 3% or a 10-year probability of a major osteoporosis-related fracture > 20% based on the [place]-adapted WHO algorithm d. Clinician judgment and/or patient preferences may indicate treatment for people with 10-year fracture probabilities above or below these  levels FOLLOW-UP: People with diagnosed cases of osteoporosis or osteopenia should be regularly tested for bone mineral density. For patients eligible for Medicare, routine testing is allowed once every 2 years. Testing frequency can be increased for patients who have rapidly progressing disease, or for those who are receiving medical therapy to restore bone mass. I have reviewed this report, and agree with the above findings. [facility] Your patient Peter Dunlap completed a FRAX assessment on 11/15/2021 using the [employer] DXA System (analysis version: 14.10) manufactured by [employer]. The following summarizes the results of our evaluation. PATIENT BIOGRAPHICAL: Name: Peter Dunlap, Peter Dunlap Patient ID: [id number] Birth Date: 09/13/1955 Height:    72.0 in. Gender:     Male      [age]       Weight:    280.0 lbs. Ethnicity:  Black                            Exam Date: 11/15/2021 FRAX* RESULTS:  (version: 3.5) 10-year Probability of Fracture1 Major Osteoporotic Fracture2 Hip Fracture 2.3% 0.3% Population: [place] (Black) Risk Factors: Secondary Osteoporosis Based on Femur (Right) Neck BMD 1 -The 10-year probability of fracture may be lower than reported if the patient has received treatment. 2 -Major Osteoporotic Fracture: Clinical Spine, Forearm, Hip or Shoulder *FRAX is a Armed forces logistics/support/administrative officer of the [employer] of [employer] for Metabolic Bone Disease, a World Science writer (WHO) [employer]. ASSESSMENT: The probability of a major osteoporotic fracture is 2.3% within the next ten years. The probability of a hip fracture is 0.3% within the next ten years. Electronically Signed   By: Frederico Hamman M.D.  On: 11/15/2021 11:36     ASSESSMENT:  1.  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 -Status post IMRT, 40 sessions completed on 06/14/2016 by Dr. Tammi Klippel. -PSA 0.7 (10/15/2018), 0.8 (02/18/2019), 1.4 (December 2020), 1.4 (June 2021) -Prostate biopsy on 02/10/2020-1 out of 12 cores from left mid lateral region positive for adenocarcinoma. -CTAP on 03/09/2020 with newly enlarged left iliac lymph nodes measuring 1.1 x 0.9 cm.  Hepatic steatosis with early signs of cirrhosis. -F-18 PSMA PET scan on 04/12/2020 showed radiotracer activity associated with enlarged left external iliac lymph node 9 mm with SUV 7.6.  Small left common iliac lymph node 6 mm, SUV 8.6.  Left periaortic lymph node 5 mm, SUV 6.3.  Lymph node between IVC and aorta at the level of right renal hilum, SUV 8.6.  Lymph node deep to the IVC measuring 6 mm, SUV 5.4.  No skeletal metastasis. 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 - Firmagon started on 04/21/2020 - Apalutamide 240 milligrams daily started around 04/22/2020.   2.  Social/family history: -He is a retired Nature conservation officer.  He lives at home with his wife.  He plays golf.  He quit smoking in 2015, smoked 1 pack/day for more than 20 years. -Sister has CML.  Maternal grandmother had breast cancer.  Maternal uncle had lung cancer.  2 maternal cousins had tongue cancer and uterine cancer.   3.  Cirrhosis: -Prior imaging showed cirrhosis of the liver with normal spleen.   PLAN:  1.  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 - Reviewed labs today which showed normal LFTs. - PSA 0.01. - Continue apalutamide 240 mg daily.  Continue Diaglyk's today and monthly. - RTC 3 months for follow-up with repeat PSA and other labs.   2.  Normocytic anemia: - Hemoglobin dropped to 8.9.  Ferritin is low at 9 and percent saturation 6.  Q22 and folic acid normal.  He complains of severe fatigue. - I have recommended parenteral iron therapy with Venofer x3 for total of  1 g.   3.  Mild thrombocytopenia: - Mild thrombocytopenia on and off since December 2020 has been stable.  Platelet count is 136.   4.  Hypothyroidism: - Continue Synthroid daily.  5.  Osteopenia: - DEXA scan on 11/15/2021 reviewed by me shows T score -1.6. - He is taking vitamin D.  I have recommended Prolia every 6 months to prevent deterioration of osteopenia while he is on GnRH antagonist. - He has some lower teeth and wears dentures.  I have recommended dental evaluation prior to proceeding with Prolia.  We will talk further about it at next visit.    Orders placed this encounter:  No orders of the defined types were placed in this encounter.    Derek Jack, MD [place] [phone]   I, Thana Ates, am acting as a scribe for Dr. Derek Jack.  I, Derek Jack MD, have reviewed the above documentation for accuracy and completeness, and I agree with the above.

## 2021-12-13 NOTE — Progress Notes (Signed)
Peter Dunlap presents today for injection per the provider's orders.  Firmagon 80 mg administration without incident; injection site WNL; see MAR for injection details.  Patient tolerated procedure well and without incident and remained stable throughout visit.  No questions or complaints noted at this time. Patient discharged ambulatory in stable condition.

## 2021-12-13 NOTE — Progress Notes (Signed)
Patient is taking Erleada as prescribed.  He has not missed any doses and reports no side effects at this time.   

## 2021-12-19 NOTE — Progress Notes (Signed)
History of Present Illness: Presents for routine follow-up   Prostate cancer:    He underwent TRUS/Bx on 8.15.2017. At that time, PSA was 9.09. Prostatic volume was 25 cc. 10/12 cores came back positive for adenocarcinoma as follows: 1 core revealed GS 3+3 5 cores revealed GS 3+4 4 cores revealed GS 4+3   He completed IMRT, 40 sessions, on 1.10.2018   8.24.2021: Repeat TRUS/Bx for elevating PSA trend following definitive Rx.  9.7.2021: He is here to discuss his biopsy results.  1/12 cores-from the left mid lateral region-came back positive for adenocarcinoma.  Grading was difficult due to his prior radiation.     10.15.2021: -CT A/P  with newly enlarged left iliac lymph nodes measuring 1.1 x 0.9 cm.  Hepatic steatosis with early signs of cirrhosis.   04/12/2020 -F-18 PSMA PET scan on showed radiotracer activity associated with enlarged left external iliac lymph node 9 mm with SUV 7.6.  Small left common iliac lymph node 6 mm, SUV 8.6.  Left periaortic lymph node 5 mm, SUV 6.3.  Lymph node between IVC and aorta at the level of right renal hilum, SUV 8.6.  Lymph node deep to the IVC measuring 6 mm, SUV 5.4.  No skeletal metastasis.   11.17.2021: Began androgen deprivation therapy with [place].   7.5.2022:  He is on [place] on a monthly basis.  He is also on [place].  Most recent PSA is less than 0.1.  Testosterone level 5.   11.9.2022: PSA < 0.01. T level castrate.  Still on monthly Firmagon 80 mg as well as Erleada.     BPH     He has been treated for BPH w/ LUTS w/ silodosin q 12 hrs    Past Medical History:  Diagnosis Date   Arteriosclerotic cardiovascular disease (ASCVD) 2005   catheterization in 10/2010:50% mid LAD, diffuse distal disease, circumflex irregularities, large dominant RCA with a 50% ostial, 70% distal, 60% posterolateral and 70% PDA; normal EF   Arthritis    Benign prostatic hypertrophy    Bilateral carpal tunnel syndrome 07/03/2018   Cerebrovascular  disease 2010   R. carotid endarterectomy; Duplex in 10/2010-widely patent ICAs, subtotal left vertebral-not thought to be contributing to symptoms   Cervical spine disease    CT in 2012-advanced degeneration and spondylosis with moderate spinal stenosis at C3-C6   CHF (congestive heart failure) ([place])    Depression    Diabetes mellitus without complication ([place])    Erectile dysfunction    Family history of breast cancer    Family history of cancer of mouth    Family history of CML (chronic monocytic leukemia)    Family history of lung cancer    Family history of ovarian cancer    Family history of stomach cancer    Gastroesophageal reflux disease    H/O hiatal hernia    H/O: substance abuse ([place])    Cocaine, marijuana, alcohol.  Quit 2013.    Hyperlipidemia    Hypertension    Non-ST elevation myocardial infarction (NSTEMI), initial episode of care ([place]) 12/02/2013   DES LAD   Obesity    Prostate cancer ([place])    Sleep apnea    CPAP   Tachy-brady syndrome ([place])    a. 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 25, 2010    CEA   COLONOSCOPY WITH PROPOFOL N/A 06/24/2021   Procedure: COLONOSCOPY WITH PROPOFOL;  Surgeon: Carol Ada, MD;  Location: [facility] ENDOSCOPY;  Service: Endoscopy;  Laterality: N/A;   CORONARY ANGIOPLASTY WITH STENT PLACEMENT  12/03/2013   LAD 90%-->0% W/ Promus Premier DES 3.0 mm x 16 mm, CFX OK, RCA 40%, EF 70-75%   LEFT ATRIAL APPENDAGE OCCLUSION N/A 08/05/2015   Procedure: LEFT ATRIAL APPENDAGE OCCLUSION;  Surgeon: [name], MD;  Location: [place] [facility] CV LAB;  Service: Cardiovascular;   Laterality: N/A;   LEFT HEART CATH AND CORONARY ANGIOGRAPHY N/A 09/13/2021   Procedure: LEFT HEART CATH AND CORONARY ANGIOGRAPHY;  Surgeon: Early Osmond, MD;  Location: [place] CV LAB;  Service: Cardiovascular;  Laterality: N/A;   LEFT HEART CATHETERIZATION WITH CORONARY ANGIOGRAM Left 12/03/2013   Procedure: LEFT HEART CATHETERIZATION WITH CORONARY ANGIOGRAM;  Surgeon: Leonie Man, MD;  Location: [facility] CATH LAB;  Service: Cardiovascular;  Laterality: Left;   LEFT HEART CATHETERIZATION WITH CORONARY ANGIOGRAM N/A 01/26/2014   Procedure: LEFT HEART CATHETERIZATION WITH CORONARY ANGIOGRAM;  Surgeon: Jettie Booze, MD;  Location: [facility] CATH LAB;  Service: Cardiovascular;  Laterality: N/A;   LEFT HEART CATHETERIZATION WITH CORONARY ANGIOGRAM N/A 08/03/2014   Procedure: LEFT HEART CATHETERIZATION WITH CORONARY ANGIOGRAM;  Surgeon: Burnell Blanks, MD;  Location: [facility] CATH LAB;  Service: Cardiovascular;  Laterality: N/A;   PERCUTANEOUS CORONARY STENT INTERVENTION (PCI-S)  12/03/2013   Procedure: PERCUTANEOUS CORONARY STENT INTERVENTION (PCI-S);  Surgeon: Leonie Man, MD;  Location[facility] C[facility] LAB;  Service: Cardiovascular;;   PERMANENT PACEMAKER INSERTION N/A 09/18/2014   Procedure: PERMANENT PACEMAKER INSERTION;  Surgeon: [name], MD;  Location: [facility] CATH LAB;  Service: Cardiovascular;  Laterality: N/A;   POLYPECTOMY  06/24/2021   Procedure: POLYPECTOMY;  Surgeon: Carol Ada, MD;  Location: [facility]OS[facility];  Service: Endoscopy;;   RADIOFREQUENCY ABLATION  2005   for PSVT   TEE WITHOUT CARDIOVERSION N/A 07/27/2015   Procedure: TRANSESOPHAGEAL ECHOCARDIOGRAM (TEE);  Surgeon: Lelon Perla, MD;  Location: [facility] ENDOSCOPY;  Service: Cardiovascular;  Laterality: N/A;   TEE WITHOUT CARDIOVERSION N/A 09/15/2015   Procedure: TRANSESOPHAGEAL ECHOCARDIOGRAM (TEE);  Surgeon: Thayer Headings, MD;  Location: [place];  Service: Cardiovascular;  Laterality: N/A;    Home Medications:  Allergies  as of 12/20/2021       Reactions   Trazodone And Nefazodone    Nightmares   Lactose Intolerance (gi) Other (See Comments)   UPSET STOMACH        Medication List        Accurate as of December 19, 2021  7:18 PM. If you have any questions, ask your nurse or doctor.          allopurinol 100 MG tablet Commonly known as: ZYLOPRIM TAKE 1 TABLET(100 MG) BY MOUTH DAILY   aspirin EC 325 MG tablet Take 1 tablet (325 mg total) daily by mouth.   Blood Glucose System Pak Kit Use as directed to monitor FSBS 1x daily. Dx: E11.9.   diclofenac Sodium 1 % Gel Commonly known as: VOLTAREN 2 g 2 (two) times daily.   Erleada 60 MG tablet Generic drug: apalutamide TAKE 4 TABLETS (240 MG TOTAL) BY MOUTH DAILY. MAY BE TAKEN WITH OR  WITHOUT FOOD. SWALLOW TABLETS WHOLE.   Farxiga 5 MG Tabs tablet Generic drug: dapagliflozin propanediol Take 5 mg by mouth daily.   fluticasone 50 MCG/ACT nasal spray Commonly known as: FLONASE Place 2 sprays into both nostrils daily.   furosemide 20 MG tablet Commonly known as: LASIX TAKE 3 TABLETS BY MOUTH DAILY   glipiZIDE 5 MG 24 hr tablet Commonly known as: GLUCOTROL XL Take 1 tablet (5 mg total) by mouth daily with breakfast.   isosorbide mononitrate 30 MG 24 hr tablet Commonly known as: IMDUR Take 1 tablet (30 mg total) by mouth daily.   Lactulose 20 GM/30ML Soln Take 30 mLs (20 g total) by mouth daily as needed. Take 30 ml by mouth every 3 hours until you have bowel movement then daily as needed   levothyroxine 200 MCG tablet Commonly known as: SYNTHROID Take 1 tablet (200 mcg total) by mouth daily before breakfast.   levothyroxine 25 MCG tablet Commonly known as: SYNTHROID Take 1 tablet (25 mcg total) by mouth See admin instructions. Takes along with 200 mcg to total 225 mcg daily   loratadine 10 MG tablet Commonly known as: CLARITIN TAKE 1 TABLET(10 MG) BY MOUTH DAILY AS NEEDED FOR ALLERGIES   magnesium oxide 400 (240 Mg) MG  tablet Commonly known as: MAG-OX TAKE 1 TABLET(400 MG) BY MOUTH TWICE DAILY   metFORMIN 500 MG tablet Commonly known as: GLUCOPHAGE Take 1 tablet (500 mg total) by mouth daily with breakfast.   naloxone 4 MG/0.1ML Liqd nasal spray kit Commonly known as: NARCAN Place 4 mg into the nose once as needed (accidental overdose).   nitroGLYCERIN 0.4 MG SL tablet Commonly known as: NITROSTAT PLACE 1 TABLET UNDER THE TONGUE EVERY 5 MINUTES AS NEEDED FOR CHEST PAIN. CALL 911 AT THIRD DOSE IN 15 MINUTES   OneTouch Ultra test strip Generic drug: glucose blood Use as instructed to monitor glucose twice daily.   oxyCODONE-acetaminophen 10-325 MG tablet Commonly known as: PERCOCET Take 1 tablet by mouth every 8 (eight) hours as needed for pain.   pantoprazole 40 MG tablet Commonly known as: PROTONIX TAKE 1 TABLET(40 MG) BY MOUTH DAILY   potassium chloride SA 20 MEQ tablet Commonly known as: KLOR-CON M Take 1 tablet (20 mEq total) by mouth daily.   rosuvastatin 40 MG tablet Commonly known as: CRESTOR Take 40 mg by mouth every evening.   silodosin 8 MG Caps capsule Commonly known as: RAPAFLO TAKE 1 CAPSULE(8 MG) BY MOUTH TWICE DAILY   solifenacin 10 MG tablet Commonly known as: VESICARE Take 1 tablet (10 mg total) by mouth daily.   sotalol 80 MG tablet Commonly known as: BETAPACE Take 1 tablet (80 mg total) by mouth 2 (two) times daily.   Vitamin B 12 500 MCG Tabs Take 500 mcg by mouth in the morning.   Vitamin D (Ergocalciferol) 1.25 MG (50000 UNIT) Caps capsule Commonly known as: DRISDOL TAKE 1 CAPSULE BY MOUTH EVERY 7 DAYS        Allergies:  Allergies  Allergen Reactions   Trazodone And Nefazodone     Nightmares   Lactose Intolerance (Gi) Other (See Comments)    UPSET STOMACH     Family History  Problem Relation Age of Onset   Hypertension Mother        Cerebrovascular disease   Diabetes Mother    Coronary artery disease Father 1   Diabetes type II Father     Hypertension Father    Heart attack Father    Diabetes  Brother    Hypertension Brother    Diabetes Sister    Hypertension Sister    Heart attack Sister 29   Leukemia Sister 85       CML   Breast cancer Maternal Grandmother        dx 101s   Lung cancer Maternal Uncle        dx >50, smoker   Cancer Cousin        mouth cancer, dx 70s, no smoking/chew tobacco hx (maternal 1st cousin)   Ovarian cancer Cousin        dx <50 (maternal 1st cousin)   Stomach cancer Cousin        dx 67s (maternal 1st cousin)   Cancer Cousin        type unknown to pt, dx >50 (paternal 1st cousin)    Social History:  reports that he quit smoking about 9 years ago. His smoking use included cigarettes. He started smoking about 49 years ago. He has a 40.00 pack-year smoking history. He has never used smokeless tobacco. He reports current alcohol use. He reports that he does not use drugs.  ROS: A complete review of systems was performed.  All systems are negative except for pertinent findings as noted.  Physical Exam:  Vital signs in last 24 hours: There were no vitals taken for this visit. Constitutional:  Alert and oriented, No acute distress Cardiovascular: Regular rate  Respiratory: Normal respiratory effort GI: Abdomen is soft, nontender, nondistended, no abdominal masses. No CVAT.  Genitourinary: Normal male phallus, testes are descended bilaterally and non-tender and without masses, scrotum is normal in appearance without lesions or masses, perineum is normal on inspection. Lymphatic: No lymphadenopathy Neurologic: Grossly intact, no focal deficits Psychiatric: Normal mood and affect  I have reviewed prior pt notes  I have reviewed notes from referring/previous physicians  I have reviewed urinalysis results  I have independently reviewed prior imaging  I have reviewed prior PSA results    Impression/Assessment:  ***  Plan:  ***

## 2021-12-20 ENCOUNTER — Ambulatory Visit (INDEPENDENT_AMBULATORY_CARE_PROVIDER_SITE_OTHER): Payer: Medicare Other | Admitting: Urology

## 2021-12-20 ENCOUNTER — Other Ambulatory Visit (HOSPITAL_COMMUNITY): Payer: Self-pay

## 2021-12-20 VITALS — BP 109/69 | HR 106 | Ht 72.0 in | Wt 288.0 lb

## 2021-12-20 DIAGNOSIS — C61 Malignant neoplasm of prostate: Secondary | ICD-10-CM

## 2021-12-20 DIAGNOSIS — Z191 Hormone sensitive malignancy status: Secondary | ICD-10-CM

## 2021-12-20 DIAGNOSIS — Z8546 Personal history of malignant neoplasm of prostate: Secondary | ICD-10-CM

## 2021-12-20 DIAGNOSIS — R3915 Urgency of urination: Secondary | ICD-10-CM | POA: Diagnosis not present

## 2021-12-20 DIAGNOSIS — R350 Frequency of micturition: Secondary | ICD-10-CM | POA: Diagnosis not present

## 2021-12-20 DIAGNOSIS — N401 Enlarged prostate with lower urinary tract symptoms: Secondary | ICD-10-CM

## 2021-12-20 LAB — BLADDER SCAN AMB NON-IMAGING: Scan Result: 52

## 2021-12-20 NOTE — Progress Notes (Signed)
post void residual =52m

## 2021-12-21 ENCOUNTER — Inpatient Hospital Stay (HOSPITAL_COMMUNITY): Payer: Medicare Other

## 2021-12-21 VITALS — BP 91/64 | HR 92 | Temp 97.4°F | Resp 20

## 2021-12-21 DIAGNOSIS — C61 Malignant neoplasm of prostate: Secondary | ICD-10-CM

## 2021-12-21 DIAGNOSIS — Z5111 Encounter for antineoplastic chemotherapy: Secondary | ICD-10-CM | POA: Diagnosis not present

## 2021-12-21 MED ORDER — SODIUM CHLORIDE 0.9 % IV SOLN: Freq: Once | INTRAVENOUS | Status: AC

## 2021-12-21 MED ORDER — SODIUM CHLORIDE 0.9 % IV SOLN
510.0000 mg | Freq: Once | INTRAVENOUS | Status: AC
  Administered 2021-12-21: 510 mg via INTRAVENOUS
  Filled 2021-12-21: qty 510

## 2021-12-21 MED ORDER — ACETAMINOPHEN 325 MG PO TABS
650.0000 mg | ORAL_TABLET | Freq: Once | ORAL | Status: AC
  Administered 2021-12-21: 650 mg via ORAL
  Filled 2021-12-21: qty 2

## 2021-12-21 MED ORDER — LORATADINE 10 MG PO TABS
10.0000 mg | ORAL_TABLET | Freq: Once | ORAL | Status: AC
  Administered 2021-12-21: 10 mg via ORAL
  Filled 2021-12-21: qty 1

## 2021-12-21 NOTE — Patient Instructions (Signed)
[facility]  Discharge Instructions: Thank you for choosing [facility] to provide your oncology and hematology care.  If you have a lab appointment with the Cancer Center, please come in thru the Main Entrance and check in at the main information desk.  Wear comfortable clothing and clothing appropriate for easy access to any Portacath or PICC line.   We strive to give you quality time with your provider. You may need to reschedule your appointment if you arrive late (15 or more minutes).  Arriving late affects you and other patients whose appointments are after yours.  Also, if you miss three or more appointments without notifying the office, you may be dismissed from the clinic at the provider's discretion.      For prescription refill requests, have your pharmacy contact our office and allow 72 hours for refills to be completed.    Today you received Feraheme IV iron     BELOW ARE SYMPTOMS THAT SHOULD BE REPORTED IMMEDIATELY: *FEVER GREATER THAN 100.4 F (38 C) OR HIGHER *CHILLS OR SWEATING *NAUSEA AND VOMITING THAT IS NOT CONTROLLED WITH YOUR NAUSEA MEDICATION *UNUSUAL SHORTNESS OF BREATH *UNUSUAL BRUISING OR BLEEDING *URINARY PROBLEMS (pain or burning when urinating, or frequent urination) *BOWEL PROBLEMS (unusual diarrhea, constipation, pain near the anus) TENDERNESS IN MOUTH AND THROAT WITH OR WITHOUT PRESENCE OF ULCERS (sore throat, sores in mouth, or a toothache) UNUSUAL RASH, SWELLING OR PAIN  UNUSUAL VAGINAL DISCHARGE OR ITCHING   Items with * indicate a potential emergency and should be followed up as soon as possible or go to the Emergency Department if any problems should occur.  Please show the CHEMOTHERAPY ALERT CARD or IMMUNOTHERAPY ALERT CARD at check-in to the Emergency Department and triage nurse.  Should you have questions after your visit or need to cancel or reschedule your appointment, please contact [facility]  [phone]  and follow the prompts.  Office hours are 8:00 a.m. to 4:30 p.m. Monday - Friday. Please note that voicemails left after 4:00 p.m. may not be returned until the following business day.  We are closed weekends and major holidays. You have access to a nurse at all times for urgent questions. Please call the main number to the clinic [phone] and follow the prompts.  For any non-urgent questions, you may also contact your provider using MyChart. We now offer e-Visits for anyone 18 and older to request care online for non-urgent symptoms. For details visit [email or web address].   Also download the MyChart app! Go to the app store, search "MyChart", open the app, select [place], and log in with your MyChart username and password.  Masks are optional in the cancer centers. If you would like for your care team to wear a mask while they are taking care of you, please let them know. For doctor visits, patients may have with them one support person who is at least 66 years old. At this time, visitors are not allowed in the infusion area.  

## 2021-12-21 NOTE — Progress Notes (Signed)
Pt presents today for Feraheme IV iron infusion per provider's order. Vital signs stable and pt voiced no new  complaints at this time.  Peripheral IV started with good blood return pre and post infusions.  Feraheme given today per MD orders. Tolerated infusion without adverse affects. Vital signs stable. No complaints at this time. Discharged from clinic ambulatory in stable condition. Alert and oriented x 3. F/U with [facility] as scheduled.

## 2021-12-28 ENCOUNTER — Inpatient Hospital Stay (HOSPITAL_COMMUNITY): Payer: Medicare Other

## 2021-12-28 VITALS — BP 108/75 | HR 80 | Temp 97.8°F | Resp 18

## 2021-12-28 DIAGNOSIS — C61 Malignant neoplasm of prostate: Secondary | ICD-10-CM

## 2021-12-28 DIAGNOSIS — Z5111 Encounter for antineoplastic chemotherapy: Secondary | ICD-10-CM | POA: Diagnosis not present

## 2021-12-28 MED ORDER — SODIUM CHLORIDE 0.9 % IV SOLN: Freq: Once | INTRAVENOUS | Status: AC

## 2021-12-28 MED ORDER — LORATADINE 10 MG PO TABS
10.0000 mg | ORAL_TABLET | Freq: Once | ORAL | Status: AC
  Administered 2021-12-28: 10 mg via ORAL
  Filled 2021-12-28: qty 1

## 2021-12-28 MED ORDER — SODIUM CHLORIDE 0.9 % IV SOLN
510.0000 mg | Freq: Once | INTRAVENOUS | Status: AC
  Administered 2021-12-28: 510 mg via INTRAVENOUS
  Filled 2021-12-28: qty 510

## 2021-12-28 MED ORDER — ACETAMINOPHEN 325 MG PO TABS
650.0000 mg | ORAL_TABLET | Freq: Once | ORAL | Status: AC
  Administered 2021-12-28: 650 mg via ORAL
  Filled 2021-12-28: qty 2

## 2021-12-28 NOTE — Patient Instructions (Signed)
[facility]  Discharge Instructions: Thank you for choosing [facility] to provide your oncology and hematology care.  If you have a lab appointment with the Cancer Center, please come in thru the Main Entrance and check in at the main information desk.  Wear comfortable clothing and clothing appropriate for easy access to any Portacath or PICC line.   We strive to give you quality time with your provider. You may need to reschedule your appointment if you arrive late (15 or more minutes).  Arriving late affects you and other patients whose appointments are after yours.  Also, if you miss three or more appointments without notifying the office, you may be dismissed from the clinic at the provider's discretion.      For prescription refill requests, have your pharmacy contact our office and allow 72 hours for refills to be completed.         To help prevent nausea and vomiting after your treatment, we encourage you to take your nausea medication as directed.  BELOW ARE SYMPTOMS THAT SHOULD BE REPORTED IMMEDIATELY: *FEVER GREATER THAN 100.4 F (38 C) OR HIGHER *CHILLS OR SWEATING *NAUSEA AND VOMITING THAT IS NOT CONTROLLED WITH YOUR NAUSEA MEDICATION *UNUSUAL SHORTNESS OF BREATH *UNUSUAL BRUISING OR BLEEDING *URINARY PROBLEMS (pain or burning when urinating, or frequent urination) *BOWEL PROBLEMS (unusual diarrhea, constipation, pain near the anus) TENDERNESS IN MOUTH AND THROAT WITH OR WITHOUT PRESENCE OF ULCERS (sore throat, sores in mouth, or a toothache) UNUSUAL RASH, SWELLING OR PAIN  UNUSUAL VAGINAL DISCHARGE OR ITCHING   Items with * indicate a potential emergency and should be followed up as soon as possible or go to the Emergency Department if any problems should occur.  Please show the CHEMOTHERAPY ALERT CARD or IMMUNOTHERAPY ALERT CARD at check-in to the Emergency Department and triage nurse.  Should you have questions after your visit or need to  cancel or reschedule your appointment, please contact [facility] [phone]  and follow the prompts.  Office hours are 8:00 a.m. to 4:30 p.m. Monday - Friday. Please note that voicemails left after 4:00 p.m. may not be returned until the following business day.  We are closed weekends and major holidays. You have access to a nurse at all times for urgent questions. Please call the main number to the clinic [phone] and follow the prompts.  For any non-urgent questions, you may also contact your provider using MyChart. We now offer e-Visits for anyone 18 and older to request care online for non-urgent symptoms. For details visit [email or web address].   Also download the MyChart app! Go to the app store, search "MyChart", open the app, select [place], and log in with your MyChart username and password.  Masks are optional in the cancer centers. If you would like for your care team to wear a mask while they are taking care of you, please let them know. For doctor visits, patients may have with them one support person who is at least 66 years old. At this time, visitors are not allowed in the infusion area.  

## 2022-01-02 ENCOUNTER — Other Ambulatory Visit (HOSPITAL_COMMUNITY): Payer: Self-pay

## 2022-01-06 ENCOUNTER — Other Ambulatory Visit (HOSPITAL_COMMUNITY): Payer: Self-pay

## 2022-01-10 ENCOUNTER — Inpatient Hospital Stay: Payer: Medicare Other | Attending: Hematology

## 2022-01-10 VITALS — BP 118/78 | HR 82 | Temp 98.3°F | Resp 20 | Wt 287.9 lb

## 2022-01-10 DIAGNOSIS — Z806 Family history of leukemia: Secondary | ICD-10-CM | POA: Insufficient documentation

## 2022-01-10 DIAGNOSIS — Z87891 Personal history of nicotine dependence: Secondary | ICD-10-CM | POA: Diagnosis not present

## 2022-01-10 DIAGNOSIS — Z801 Family history of malignant neoplasm of trachea, bronchus and lung: Secondary | ICD-10-CM | POA: Insufficient documentation

## 2022-01-10 DIAGNOSIS — Z808 Family history of malignant neoplasm of other organs or systems: Secondary | ICD-10-CM | POA: Diagnosis not present

## 2022-01-10 DIAGNOSIS — Z800 Family history of malignant neoplasm of digestive organs: Secondary | ICD-10-CM | POA: Diagnosis not present

## 2022-01-10 DIAGNOSIS — Z79899 Other long term (current) drug therapy: Secondary | ICD-10-CM | POA: Insufficient documentation

## 2022-01-10 DIAGNOSIS — R5383 Other fatigue: Secondary | ICD-10-CM | POA: Diagnosis not present

## 2022-01-10 DIAGNOSIS — Z809 Family history of malignant neoplasm, unspecified: Secondary | ICD-10-CM | POA: Diagnosis not present

## 2022-01-10 DIAGNOSIS — Z5111 Encounter for antineoplastic chemotherapy: Secondary | ICD-10-CM | POA: Diagnosis present

## 2022-01-10 DIAGNOSIS — Z8249 Family history of ischemic heart disease and other diseases of the circulatory system: Secondary | ICD-10-CM | POA: Diagnosis not present

## 2022-01-10 DIAGNOSIS — R39198 Other difficulties with micturition: Secondary | ICD-10-CM | POA: Insufficient documentation

## 2022-01-10 DIAGNOSIS — C61 Malignant neoplasm of prostate: Secondary | ICD-10-CM | POA: Diagnosis present

## 2022-01-10 DIAGNOSIS — Z833 Family history of diabetes mellitus: Secondary | ICD-10-CM | POA: Insufficient documentation

## 2022-01-10 DIAGNOSIS — Z8041 Family history of malignant neoplasm of ovary: Secondary | ICD-10-CM | POA: Insufficient documentation

## 2022-01-10 MED ORDER — DEGARELIX ACETATE 80 MG ~~LOC~~ SOLR
80.0000 mg | Freq: Once | SUBCUTANEOUS | Status: AC
  Administered 2022-01-10: 80 mg via SUBCUTANEOUS
  Filled 2022-01-10: qty 4

## 2022-01-10 NOTE — Progress Notes (Signed)
Peter Dunlap presents today for injection per the provider's orders.  Firmagon administration without incident; injection site WNL; see MAR for injection details.  Patient tolerated procedure well and without incident.  No questions or complaints noted at this time.   Discharged from clinic ambulatory in stable condition. Alert and oriented x 3. F/U with [facility] as scheduled.

## 2022-01-10 NOTE — Patient Instructions (Signed)
[place]  Discharge Instructions: Thank you for choosing [facility] to provide your oncology and hematology care.  If you have a lab appointment with the [facility], please come in thru the Main Entrance and check in at the main information desk.  Wear comfortable clothing and clothing appropriate for easy access to any Portacath or PICC line.   We strive to give you quality time with your provider. You may need to reschedule your appointment if you arrive late (15 or more minutes).  Arriving late affects you and other patients whose appointments are after yours.  Also, if you miss three or more appointments without notifying the office, you may be dismissed from the clinic at the provider's discretion.      For prescription refill requests, have your pharmacy contact our office and allow 72 hours for refills to be completed.    Today you received the following chemotherapy and/or immunotherapy agents Firmagon injection.   BELOW ARE SYMPTOMS THAT SHOULD BE REPORTED IMMEDIATELY: *FEVER GREATER THAN 100.4 F (38 C) OR HIGHER *CHILLS OR SWEATING *NAUSEA AND VOMITING THAT IS NOT CONTROLLED WITH YOUR NAUSEA MEDICATION *UNUSUAL SHORTNESS OF BREATH *UNUSUAL BRUISING OR BLEEDING *URINARY PROBLEMS (pain or burning when urinating, or frequent urination) *BOWEL PROBLEMS (unusual diarrhea, constipation, pain near the anus) TENDERNESS IN MOUTH AND THROAT WITH OR WITHOUT PRESENCE OF ULCERS (sore throat, sores in mouth, or a toothache) UNUSUAL RASH, SWELLING OR PAIN  UNUSUAL VAGINAL DISCHARGE OR ITCHING   Items with * indicate a potential emergency and should be followed up as soon as possible or go to the Emergency Department if any problems should occur.  Please show the CHEMOTHERAPY ALERT CARD or IMMUNOTHERAPY ALERT CARD at check-in to the Emergency Department and triage nurse.  Should you have questions after your visit or need to cancel or reschedule your  appointment, please contact [facility] [phone]  and follow the prompts.  Office hours are 8:00 a.m. to 4:30 p.m. Monday - Friday. Please note that voicemails left after 4:00 p.m. may not be returned until the following business day.  We are closed weekends and major holidays. You have access to a nurse at all times for urgent questions. Please call the main number to the clinic [phone] and follow the prompts.  For any non-urgent questions, you may also contact your provider using MyChart. We now offer e-Visits for anyone 31 and older to request care online for non-urgent symptoms. For details visit [email or web address].   Also download the MyChart app! Go to the app store, search "MyChart", open the app, select [place], and log in with your MyChart username and password.  Masks are optional in the cancer centers. If you would like for your care team to wear a mask while they are taking care of you, please let them know. For doctor visits, patients may have with them one support person who is at least 66 years old. At this time, visitors are not allowed in the infusion area.

## 2022-01-23 ENCOUNTER — Other Ambulatory Visit (HOSPITAL_COMMUNITY): Payer: Self-pay

## 2022-01-30 NOTE — Progress Notes (Signed)
History of Present Illness:  Presents for routine follow-up of LUTS--followup from 7.18.2023 appt.   Prostate cancer:    He underwent TRUS/Bx on 8.15.2017. At that time, PSA was 9.09. Prostatic volume was 25 cc. 10/12 cores came back positive for adenocarcinoma as follows: 1 core revealed GS 3+3 5 cores revealed GS 3+4 4 cores revealed GS 4+3   He completed IMRT, 40 sessions, on 1.10.2018   8.24.2021: Repeat TRUS/Bx for elevating PSA trend following definitive Rx.   9.7.2021: He is here to discuss his biopsy results.  1/12 cores-from the left mid lateral region-came back positive for adenocarcinoma.  Grading was difficult due to his prior radiation.     10.15.2021: -CT A/P  with newly enlarged left iliac lymph nodes measuring 1.1 x 0.9 cm.  Hepatic steatosis with early signs of cirrhosis.   04/12/2020 -F-18 PSMA PET scan on showed radiotracer activity associated with enlarged left external iliac lymph node 9 mm with SUV 7.6.  Small left common iliac lymph node 6 mm, SUV 8.6.  Left periaortic lymph node 5 mm, SUV 6.3.  Lymph node between IVC and aorta at the level of right renal hilum, SUV 8.6.  Lymph node deep to the IVC measuring 6 mm, SUV 5.4.  No skeletal metastasis.   11.17.2021: Began androgen deprivation therapy with [place].   7.5.2022:  He is on [place] on a monthly basis.  He is also on [place].  Most recent PSA is less than 0.1.  Testosterone level 5.   11.9.2022: PSA < 0.01. T level castrate.  Still on monthly Firmagon 80 mg as well as Erleada.     7.18.2023: PSA is 0.01, testosterone level still castrate.  Still on [place].  He does complain of shortness of breath.  Most recent hemoglobin 8.9.     BPH     7.18.2023: He has been treated for BPH w/ LUTS w/ silodosin q 12 hrs.  Additionally, he apparently has been on Solifenacin although he is not familiar with this medication at the present time.  He is complaining mainly about his lower urinary  tract symptoms.  IPSS 26, quality-of-life score 3.  Residual urine volume today 52 mL. I recommended that he make sure that he is taking the solifenacin. Recommended that he limit caffeine intake and pm fluids.  8.29.2023: He is on dual therapy including Solifenacin daily and Rapaflo twice a day.  Still having nocturia x4-5.  Has not limited his evening fluids, however.  Has limited his caffeine.  Has some urgency but no urgency incontinence.  He has had no gross hematuria.  Slight occasional dysuria.    Past Medical History:  Diagnosis Date   Arteriosclerotic cardiovascular disease (ASCVD) 2005   catheterization in 10/2010:50% mid LAD, diffuse distal disease, circumflex irregularities, large dominant RCA with a 50% ostial, 70% distal, 60% posterolateral and 70% PDA; normal EF   Arthritis    Benign prostatic hypertrophy    Bilateral carpal tunnel syndrome 07/03/2018   Cerebrovascular disease 2010   R. carotid endarterectomy; Duplex in 10/2010-widely patent ICAs, subtotal left vertebral-not thought to be contributing to symptoms   Cervical spine disease    CT in 2012-advanced degeneration and spondylosis with moderate spinal stenosis at C3-C6   CHF (congestive heart failure) ([place])    Depression    Diabetes mellitus without complication ([place])    Erectile dysfunction    Family history of breast cancer    Family history of cancer of mouth  Family history of CML (chronic monocytic leukemia)    Family history of lung cancer    Family history of ovarian cancer    Family history of stomach cancer    Gastroesophageal reflux disease    H/O hiatal hernia    H/O: substance abuse ([place])    Cocaine, marijuana, alcohol.  Quit 2013.    Hyperlipidemia    Hypertension    Non-ST elevation myocardial infarction (NSTEMI), initial episode of care ([place]) 12/02/2013   DES LAD   Obesity    Prostate cancer ([place])    Sleep apnea    CPAP   Tachy-brady syndrome ([place])    a. 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 25, 2010    CEA   COLONOSCOPY WITH PROPOFOL N/A 06/24/2021   Procedure: COLONOSCOPY WITH PROPOFOL;  Surgeon: Carol Ada, MD;  Location: [facility];  Service: Endoscopy;  Laterality: N/A;   CORONARY ANGIOPLASTY WITH STENT PLACEMENT  12/03/2013   LAD 90%-->0% W/ Promus Premier DES 3.0 mm x 16 mm, CFX OK, RCA 40%, EF 70-75%   LEFT ATRIAL APPENDAGE OCCLUSION N/A 08/05/2015   Procedure: LEFT ATRIAL APPENDAGE OCCLUSION;  Surgeon: [name], MD;  Location: [facility];  Service: Cardiovascular;  Laterality: N/A;   LEFT HEART CATH AND CORONARY ANGIOGRAPHY N/A 09/13/2021   Procedure: LEFT HEART CATH AND CORONARY ANGIOGRAPHY;  Surgeon: Early Osmond, MD;  Location: [facility];  Service: Cardiovascular;  Laterality: N/A;   LEFT HEART CATHETERIZATION WITH CORONARY ANGIOGRAM Left 12/03/2013   Procedure: LEFT HEART CATHETERIZATION WITH CORONARY ANGIOGRAM;  Surgeon: Leonie Man, MD;  Location[facility] CATH LAB;  Service: Cardiovascular;  Laterality: Left;   LEFT HEART CATHETERIZATION WITH CORONARY ANGIOGRAM N/A 01/26/2014   Procedure: LEFT HEART CATHETERIZATION WITH CORONARY ANGIOGRAM;  Surgeon: Jettie Booze, MD;  Location: [facility] CATH LAB;  Service: Cardiovascular;  Laterality: N/A;   LEFT HEART CATHETERIZATION WITH CORONARY ANGIOGRAM N/A 08/03/2014   Procedure: LEFT HEART CATHETERIZATION WITH CORONARY ANGIOGRAM;  Surgeon: Burnell Blanks, MD;  Location: [facility] CATH LAB;  Service: Cardiovascular;  Laterality: N/A;   PERCUTANEOUS CORONARY STENT INTERVENTION (PCI-S)  12/03/2013   Procedure: PERCUTANEOUS CORONARY STENT  INTERVENTION (PCI-S);  Surgeon: Leonie Man, MD;  Location: [facility] CATH LAB;  Service: Cardiovascular;;   PERMANENT PACEMAKER INSERTION N/A 09/18/2014   Procedure: PERMANENT PACEMAKER INSERTION;  Surgeon: [name], MD;  Location: [facility] CATH LAB;  Service: Cardiovascular;  Laterality: N/A;   POLYPECTOMY  06/24/2021   Procedure: POLYPECTOMY;  Surgeon: Carol Ada, MD;  Location: [facility];  Service: Endoscopy;;   RADIOFREQUENCY ABLATION  2005   for PSVT   TEE WITHOUT CARDIOVERSION N/A 07/27/2015   Procedure: TRANSESOPHAGEAL ECHOCARDIOGRAM (TEE);  Surgeon: Lelon Perla, MD;  Location: [facility];  Service: Cardiovascular;  Laterality: N/A;   TEE WITHOUT CARDIOVERSION N/A 09/15/2015   Procedure: TRANSESOPHAGEAL ECHOCARDIOGRAM (TEE);  Surgeon: Thayer Headings, MD;  Location: [facility];  Service: Cardiovascular;  Laterality: N/A;    Home Medications:  Allergies as of 01/31/2022  Reactions   Trazodone And Nefazodone    Nightmares   Lactose Intolerance (gi) Other (See Comments)   UPSET STOMACH        Medication List        Accurate as of January 30, 2022  7:23 PM. If you have any questions, ask your nurse or doctor.          allopurinol 100 MG tablet Commonly known as: ZYLOPRIM TAKE 1 TABLET(100 MG) BY MOUTH DAILY   aspirin EC 325 MG tablet Take 1 tablet (325 mg total) daily by mouth.   Blood Glucose System Pak Kit Use as directed to monitor FSBS 1x daily. Dx: E11.9.   diclofenac Sodium 1 % Gel Commonly known as: VOLTAREN 2 g 2 (two) times daily.   Erleada 60 MG tablet Generic drug: apalutamide TAKE 4 TABLETS (240 MG TOTAL) BY MOUTH DAILY. MAY BE TAKEN WITH OR WITHOUT FOOD. SWALLOW TABLETS WHOLE.   Farxiga 5 MG Tabs tablet Generic drug: dapagliflozin propanediol Take 5 mg by mouth daily.   fluticasone 50 MCG/ACT nasal spray Commonly known as: FLONASE Place 2 sprays into both nostrils daily.   furosemide 20 MG tablet Commonly known as: LASIX TAKE  3 TABLETS BY MOUTH DAILY   glipiZIDE 5 MG 24 hr tablet Commonly known as: GLUCOTROL XL Take 1 tablet (5 mg total) by mouth daily with breakfast.   isosorbide mononitrate 30 MG 24 hr tablet Commonly known as: IMDUR Take 1 tablet (30 mg total) by mouth daily.   Lactulose 20 GM/30ML Soln Take 30 mLs (20 g total) by mouth daily as needed. Take 30 ml by mouth every 3 hours until you have bowel movement then daily as needed   levothyroxine 200 MCG tablet Commonly known as: SYNTHROID Take 1 tablet (200 mcg total) by mouth daily before breakfast.   levothyroxine 25 MCG tablet Commonly known as: SYNTHROID Take 1 tablet (25 mcg total) by mouth See admin instructions. Takes along with 200 mcg to total 225 mcg daily   loratadine 10 MG tablet Commonly known as: CLARITIN TAKE 1 TABLET(10 MG) BY MOUTH DAILY AS NEEDED FOR ALLERGIES   magnesium oxide 400 (240 Mg) MG tablet Commonly known as: MAG-OX TAKE 1 TABLET(400 MG) BY MOUTH TWICE DAILY   metFORMIN 500 MG tablet Commonly known as: GLUCOPHAGE Take 1 tablet (500 mg total) by mouth daily with breakfast.   naloxone 4 MG/0.1ML Liqd nasal spray kit Commonly known as: NARCAN Place 4 mg into the nose once as needed (accidental overdose).   nitroGLYCERIN 0.4 MG SL tablet Commonly known as: NITROSTAT PLACE 1 TABLET UNDER THE TONGUE EVERY 5 MINUTES AS NEEDED FOR CHEST PAIN. CALL 911 AT THIRD DOSE IN 15 MINUTES   OneTouch Ultra test strip Generic drug: glucose blood Use as instructed to monitor glucose twice daily.   oxyCODONE-acetaminophen 10-325 MG tablet Commonly known as: PERCOCET Take 1 tablet by mouth every 8 (eight) hours as needed for pain.   pantoprazole 40 MG tablet Commonly known as: PROTONIX TAKE 1 TABLET(40 MG) BY MOUTH DAILY   potassium chloride SA 20 MEQ tablet Commonly known as: KLOR-CON M Take 1 tablet (20 mEq total) by mouth daily.   rosuvastatin 40 MG tablet Commonly known as: CRESTOR Take 40 mg by mouth every  evening.   silodosin 8 MG Caps capsule Commonly known as: RAPAFLO TAKE 1 CAPSULE(8 MG) BY MOUTH TWICE DAILY   solifenacin 10 MG tablet Commonly known as: VESICARE Take 1 tablet (10 mg total) by mouth daily.  sotalol 80 MG tablet Commonly known as: BETAPACE Take 1 tablet (80 mg total) by mouth 2 (two) times daily.   Vitamin B 12 500 MCG Tabs Take 500 mcg by mouth in the morning.   Vitamin D (Ergocalciferol) 1.25 MG (50000 UNIT) Caps capsule Commonly known as: DRISDOL TAKE 1 CAPSULE BY MOUTH EVERY 7 DAYS        Allergies:  Allergies  Allergen Reactions   Trazodone And Nefazodone     Nightmares   Lactose Intolerance (Gi) Other (See Comments)    UPSET STOMACH     Family History  Problem Relation Age of Onset   Hypertension Mother        Cerebrovascular disease   Diabetes Mother    Coronary artery disease Father 19   Diabetes type II Father    Hypertension Father    Heart attack Father    Diabetes Brother    Hypertension Brother    Diabetes Sister    Hypertension Sister    Heart attack Sister 69   Leukemia Sister 71       CML   Breast cancer Maternal Grandmother        dx 34s   Lung cancer Maternal Uncle        dx >50, smoker   Cancer Cousin        mouth cancer, dx 81s, no smoking/chew tobacco hx (maternal 1st cousin)   Ovarian cancer Cousin        dx <50 (maternal 1st cousin)   Stomach cancer Cousin        dx 37s (maternal 1st cousin)   Cancer Cousin        type unknown to pt, dx >50 (paternal 1st cousin)    Social History:  reports that he quit smoking about 9 years ago. His smoking use included cigarettes. He started smoking about 49 years ago. He has a 40.00 pack-year smoking history. He has never used smokeless tobacco. He reports current alcohol use. He reports that he does not use drugs.  ROS: A complete review of systems was performed.  All systems are negative except for pertinent findings as noted.  Physical Exam:  Vital signs in last 24  hours: There were no vitals taken for this visit. Constitutional:  Alert and oriented, No acute distress Cardiovascular: Regular rate.  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  Bothersome to the patient but he does not necessarily want any other treatment currently  Plan:  1.  I outlined a strategy for nocturia-still continue to limit liquid intake in the afternoon and evening, continue his furosemide.  I also let him know that he would be a candidate for desmopressin in the evening or perhaps even PTNS  2.  He wants to hold on any extra therapy at this time  3.  I will see back in 4 months for routine check

## 2022-01-31 ENCOUNTER — Ambulatory Visit (INDEPENDENT_AMBULATORY_CARE_PROVIDER_SITE_OTHER): Payer: Medicare Other | Admitting: Urology

## 2022-01-31 ENCOUNTER — Encounter: Payer: Self-pay | Admitting: Urology

## 2022-01-31 VITALS — BP 97/62 | HR 108

## 2022-01-31 DIAGNOSIS — R3915 Urgency of urination: Secondary | ICD-10-CM | POA: Diagnosis not present

## 2022-01-31 DIAGNOSIS — R350 Frequency of micturition: Secondary | ICD-10-CM

## 2022-01-31 DIAGNOSIS — R8281 Pyuria: Secondary | ICD-10-CM

## 2022-01-31 DIAGNOSIS — N401 Enlarged prostate with lower urinary tract symptoms: Secondary | ICD-10-CM | POA: Diagnosis not present

## 2022-01-31 DIAGNOSIS — R351 Nocturia: Secondary | ICD-10-CM

## 2022-01-31 DIAGNOSIS — C61 Malignant neoplasm of prostate: Secondary | ICD-10-CM

## 2022-01-31 DIAGNOSIS — Z191 Hormone sensitive malignancy status: Secondary | ICD-10-CM

## 2022-01-31 LAB — URINALYSIS, ROUTINE W REFLEX MICROSCOPIC
Bilirubin, UA: NEGATIVE
Glucose, UA: NEGATIVE
Nitrite, UA: NEGATIVE
Specific Gravity, UA: 1.02 (ref 1.005–1.030)
Urobilinogen, Ur: 0.2 mg/dL (ref 0.2–1.0)
pH, UA: 6.5 (ref 5.0–7.5)

## 2022-01-31 LAB — MICROSCOPIC EXAMINATION
Renal Epithel, UA: NONE SEEN /hpf
WBC, UA: >30 /hpf — AB (ref 0–5)

## 2022-01-31 LAB — BLADDER SCAN AMB NON-IMAGING: Scan Result: 27

## 2022-02-02 ENCOUNTER — Other Ambulatory Visit (HOSPITAL_COMMUNITY): Payer: Self-pay

## 2022-02-04 LAB — URINE CULTURE

## 2022-02-07 ENCOUNTER — Inpatient Hospital Stay: Payer: Medicare Other | Attending: Hematology

## 2022-02-07 VITALS — BP 124/69 | HR 95 | Temp 98.3°F | Resp 18

## 2022-02-07 DIAGNOSIS — C778 Secondary and unspecified malignant neoplasm of lymph nodes of multiple regions: Secondary | ICD-10-CM | POA: Insufficient documentation

## 2022-02-07 DIAGNOSIS — Z5111 Encounter for antineoplastic chemotherapy: Secondary | ICD-10-CM | POA: Diagnosis present

## 2022-02-07 DIAGNOSIS — Z79899 Other long term (current) drug therapy: Secondary | ICD-10-CM | POA: Insufficient documentation

## 2022-02-07 DIAGNOSIS — C61 Malignant neoplasm of prostate: Secondary | ICD-10-CM | POA: Diagnosis present

## 2022-02-07 DIAGNOSIS — E559 Vitamin D deficiency, unspecified: Secondary | ICD-10-CM | POA: Insufficient documentation

## 2022-02-07 MED ORDER — DEGARELIX ACETATE 80 MG ~~LOC~~ SOLR
80.0000 mg | Freq: Once | SUBCUTANEOUS | Status: AC
  Administered 2022-02-07: 80 mg via SUBCUTANEOUS
  Filled 2022-02-07: qty 4

## 2022-02-07 NOTE — Progress Notes (Signed)
Patient tolerated injection with no complaints voiced.  Site clean and dry with no bruising or swelling noted at site.  See MAR for details.  Band aid applied.  Patient stable during and after injection.  Vss with discharge and left in satisfactory condition with no s/s of distress noted.  

## 2022-02-07 NOTE — Patient Instructions (Signed)
[facility]  Discharge Instructions: Thank you for choosing [facility] to provide your oncology and hematology care.  If you have a lab appointment with the [facility], please come in thru the Main Entrance and check in at the main information desk.  Wear comfortable clothing and clothing appropriate for easy access to any Portacath or PICC line.   We strive to give you quality time with your provider. You may need to reschedule your appointment if you arrive late (15 or more minutes).  Arriving late affects you and other patients whose appointments are after yours.  Also, if you miss three or more appointments without notifying the office, you may be dismissed from the clinic at the provider's discretion.      For prescription refill requests, have your pharmacy contact our office and allow 72 hours for refills to be completed.    Degarelix Injection What is this medication? DEGARELIX (deg a REL ix) treats prostate cancer. It works by decreasing levels of the hormone testosterone in the body. This prevents prostate cancer cells from spreading or growing. It belongs to a group of medications called GnRH blockers. This medicine may be used for other purposes; ask your health care provider or pharmacist if you have questions. COMMON BRAND NAME(S): Degarelix, [place] What should I tell my care team before I take this medication? They need to know if you have any of these conditions: Diabetes Heart disease Kidney disease Liver disease Low levels of potassium or magnesium in the blood Osteoporosis, weak bones An unusual or allergic reaction to degarelix, mannitol, other medications, foods, dyes, or preservatives If you or your partner are pregnant or trying to get pregnant How should I use this medication? This medication is injected under the skin. It is usually given by your care team in a hospital or clinic setting. It may also be given at home. If  you get this medication at home, you will be taught how to prepare and give it. Use exactly as directed. Take it as directed on the prescription label at the same time every day. Keep taking it unless your care team tells you to stop. It is important that you put your used needles and syringes in a special sharps container. Do not put them in a trash can. If you do not have a sharps container, call your pharmacist or care team to get one. Talk to your care team about the use of this medication in children. Special care may be needed. Overdosage: If you think you have taken too much of this medicine contact a poison control center or emergency room at once. NOTE: This medicine is only for you. Do not share this medicine with others. What if I miss a dose? If you get this medication at the hospital or clinic: It is important not to miss your dose. Call your care team if you are unable to keep an appointment. If you give yourself this medication at home: If you miss a dose, take it as soon as you can. If it is almost time for your next dose, take only that dose. Do not take double or extra doses. Call your care team with questions. What may interact with this medication? Do not take this medication with any of the following: Cisapride Dronedarone Pimozide Thioridazine This medication may also interact with the following: Other medications that cause heart rhythm changes This list may not describe all possible interactions. Give your health care provider a  list of all the medicines, herbs, non-prescription drugs, or dietary supplements you use. Also tell them if you smoke, drink alcohol, or use illegal drugs. Some items may interact with your medicine. What should I watch for while using this medication? Your condition will be monitored carefully while you are receiving this medication. You may need blood work while taking this medication. Do not rub or scratch injection site. There may be a lump at  the injection site, or it may be red or sore for a few days after your dose. This medication may cause infertility. Talk to your care team if you are concerned about your fertility. What side effects may I notice from receiving this medication? Side effects that you should report to your care team as soon as possible: Allergic reactions or angioedema--skin rash, itching or hives, swelling of the face, eyes, lips, tongue, arms, or legs, trouble swallowing or breathing Heart rhythm changes--fast or irregular heartbeat, dizziness, feeling faint or lightheaded, chest pain, trouble breathing Side effects that usually do not require medical attention (report to your care team if they continue or are bothersome): Hot flashes Pain, redness, or irritation at injection site Weight gain This list may not describe all possible side effects. Call your doctor for medical advice about side effects. You may report side effects to FDA at 1-800-FDA-1088. Where should I keep my medication? Keep out of the reach of children and pets. This medication is usually given in a hospital or clinic and will not be stored at home. In rare cases, this medication may be given at home. If you are using this medication at home, you will be instructed on how to store this medication. Get rid of any unused medication after the expiration date. To get rid of medications that are no longer needed or have expired: Take the medication to a medication take-back program. Check with your pharmacy or law enforcement to find a location. If you cannot return the medication, ask your pharmacist or care team how to get rid of this medication safely. NOTE: This sheet is a summary. It may not cover all possible information. If you have questions about this medicine, talk to your doctor, pharmacist, or health care provider.  2023 Elsevier/Gold Standard (2021-10-12 00:00:00)    To help prevent nausea and vomiting after your treatment, we  encourage you to take your nausea medication as directed.  BELOW ARE SYMPTOMS THAT SHOULD BE REPORTED IMMEDIATELY: *FEVER GREATER THAN 100.4 F (38 C) OR HIGHER *CHILLS OR SWEATING *NAUSEA AND VOMITING THAT IS NOT CONTROLLED WITH YOUR NAUSEA MEDICATION *UNUSUAL SHORTNESS OF BREATH *UNUSUAL BRUISING OR BLEEDING *URINARY PROBLEMS (pain or burning when urinating, or frequent urination) *BOWEL PROBLEMS (unusual diarrhea, constipation, pain near the anus) TENDERNESS IN MOUTH AND THROAT WITH OR WITHOUT PRESENCE OF ULCERS (sore throat, sores in mouth, or a toothache) UNUSUAL RASH, SWELLING OR PAIN  UNUSUAL VAGINAL DISCHARGE OR ITCHING   Items with * indicate a potential emergency and should be followed up as soon as possible or go to the Emergency Department if any problems should occur.  Please show the CHEMOTHERAPY ALERT CARD or IMMUNOTHERAPY ALERT CARD at check-in to the Emergency Department and triage nurse.  Should you have questions after your visit or need to cancel or reschedule your appointment, please contact [facility] [phone]  and follow the prompts.  Office hours are 8:00 a.m. to 4:30 p.m. Monday - Friday. Please note that voicemails left after 4:00 p.m. may not be returned  until the following business day.  We are closed weekends and major holidays. You have access to a nurse at all times for urgent questions. Please call the main number to the clinic [phone] and follow the prompts.  For any non-urgent questions, you may also contact your provider using MyChart. We now offer e-Visits for anyone 57 and older to request care online for non-urgent symptoms. For details visit [email or web address].   Also download the MyChart app! Go to the app store, search "MyChart", open the app, select [facility], and log in with your MyChart username and password.  Masks are optional in the cancer centers. If you would like for your care team to wear a mask while  they are taking care of you, please let them know. You may have one support person who is at least 66 years old accompany you for your appointments.

## 2022-02-08 ENCOUNTER — Ambulatory Visit (INDEPENDENT_AMBULATORY_CARE_PROVIDER_SITE_OTHER): Payer: Medicare Other

## 2022-02-08 DIAGNOSIS — I495 Sick sinus syndrome: Secondary | ICD-10-CM

## 2022-02-08 LAB — CUP PACEART REMOTE DEVICE CHECK
Battery Remaining Longevity: 45 mo
Battery Remaining Percentage: 37 %
Battery Voltage: 2.95 V
Brady Statistic AP VP Percent: 1 %
Brady Statistic AP VS Percent: 2.7 %
Brady Statistic AS VP Percent: 2.7 %
Brady Statistic AS VS Percent: 94 %
Brady Statistic RA Percent Paced: 1.9 %
Brady Statistic RV Percent Paced: 2.8 %
Date Time Interrogation Session: 20230906040856
Implantable Lead Implant Date: 20160415
Implantable Lead Implant Date: 20160415
Implantable Lead Location: 753859
Implantable Lead Location: 753860
Implantable Pulse Generator Implant Date: 20160415
Lead Channel Impedance Value: 410 Ohm
Lead Channel Impedance Value: 580 Ohm
Lead Channel Pacing Threshold Amplitude: 0.75 V
Lead Channel Pacing Threshold Amplitude: 0.75 V
Lead Channel Pacing Threshold Pulse Width: 0.5 ms
Lead Channel Pacing Threshold Pulse Width: 0.5 ms
Lead Channel Sensing Intrinsic Amplitude: 1.5 mV
Lead Channel Sensing Intrinsic Amplitude: 12 mV
Lead Channel Setting Pacing Amplitude: 2 V
Lead Channel Setting Pacing Amplitude: 2.5 V
Lead Channel Setting Pacing Pulse Width: 0.5 ms
Lead Channel Setting Sensing Sensitivity: 2 mV
Pulse Gen Model: 2240
Pulse Gen Serial Number: 7756161

## 2022-02-12 ENCOUNTER — Emergency Department (HOSPITAL_COMMUNITY)
Admission: EM | Admit: 2022-02-12 | Discharge: 2022-02-12 | Discharge status: Against Medical Advice | Payer: Medicare Other | Attending: Emergency Medicine | Admitting: Emergency Medicine

## 2022-02-12 DIAGNOSIS — Z8546 Personal history of malignant neoplasm of prostate: Secondary | ICD-10-CM | POA: Insufficient documentation

## 2022-02-12 DIAGNOSIS — Z5321 Procedure and treatment not carried out due to patient leaving prior to being seen by health care provider: Secondary | ICD-10-CM | POA: Diagnosis not present

## 2022-02-12 DIAGNOSIS — M545 Low back pain, unspecified: Secondary | ICD-10-CM | POA: Insufficient documentation

## 2022-02-12 DIAGNOSIS — R319 Hematuria, unspecified: Secondary | ICD-10-CM | POA: Diagnosis not present

## 2022-02-12 LAB — CBC WITH DIFFERENTIAL/PLATELET
Abs Immature Granulocytes: 0.02 10*3/uL (ref 0.00–0.07)
Basophils Absolute: 0.1 10*3/uL (ref 0.0–0.1)
Basophils Relative: 1 %
Eosinophils Absolute: 0.1 10*3/uL (ref 0.0–0.5)
Eosinophils Relative: 2 %
HCT: 35.8 % — ABNORMAL LOW (ref 39.0–52.0)
Hemoglobin: 11.3 g/dL — ABNORMAL LOW (ref 13.0–17.0)
Immature Granulocytes: 0 %
Lymphocytes Relative: 27 %
Lymphs Abs: 2 10*3/uL (ref 0.7–4.0)
MCH: 30 pg (ref 26.0–34.0)
MCHC: 31.6 g/dL (ref 30.0–36.0)
MCV: 95 fL (ref 80.0–100.0)
Monocytes Absolute: 0.7 10*3/uL (ref 0.1–1.0)
Monocytes Relative: 10 %
Neutro Abs: 4.4 10*3/uL (ref 1.7–7.7)
Neutrophils Relative %: 60 %
Platelets: 135 10*3/uL — ABNORMAL LOW (ref 150–400)
RBC: 3.77 MIL/uL — ABNORMAL LOW (ref 4.22–5.81)
RDW: 19.5 % — ABNORMAL HIGH (ref 11.5–15.5)
WBC: 7.3 10*3/uL (ref 4.0–10.5)
nRBC: 0 % (ref 0.0–0.2)

## 2022-02-12 LAB — COMPREHENSIVE METABOLIC PANEL
ALT: 10 U/L (ref 0–44)
AST: 23 U/L (ref 15–41)
Albumin: 3.4 g/dL — ABNORMAL LOW (ref 3.5–5.0)
Alkaline Phosphatase: 86 U/L (ref 38–126)
Anion gap: 10 (ref 5–15)
BUN: 8 mg/dL (ref 8–23)
CO2: 21 mmol/L — ABNORMAL LOW (ref 22–32)
Calcium: 9.3 mg/dL (ref 8.9–10.3)
Chloride: 108 mmol/L (ref 98–111)
Creatinine, Ser: 1.26 mg/dL — ABNORMAL HIGH (ref 0.61–1.24)
GFR, Estimated: >60 mL/min (ref >60)
Glucose, Bld: 139 mg/dL — ABNORMAL HIGH (ref 70–99)
Potassium: 4.2 mmol/L (ref 3.5–5.1)
Sodium: 139 mmol/L (ref 135–145)
Total Bilirubin: 0.4 mg/dL (ref 0.3–1.2)
Total Protein: 7.1 g/dL (ref 6.5–8.1)

## 2022-02-12 NOTE — ED Notes (Signed)
Pt left due to not being seen quick enough 

## 2022-02-12 NOTE — ED Triage Notes (Signed)
Patient reports low back pain with hematuria /clots this evening , history of prostate cancer.

## 2022-02-13 ENCOUNTER — Ambulatory Visit (INDEPENDENT_AMBULATORY_CARE_PROVIDER_SITE_OTHER): Payer: Medicare Other | Admitting: Physician Assistant

## 2022-02-13 VITALS — BP 123/80 | HR 88

## 2022-02-13 DIAGNOSIS — R8281 Pyuria: Secondary | ICD-10-CM

## 2022-02-13 DIAGNOSIS — R300 Dysuria: Secondary | ICD-10-CM | POA: Diagnosis not present

## 2022-02-13 DIAGNOSIS — R319 Hematuria, unspecified: Secondary | ICD-10-CM

## 2022-02-13 LAB — URINALYSIS, ROUTINE W REFLEX MICROSCOPIC
Bilirubin, UA: NEGATIVE
Nitrite, UA: NEGATIVE
Specific Gravity, UA: 1.02 (ref 1.005–1.030)
Urobilinogen, Ur: 0.2 mg/dL (ref 0.2–1.0)
pH, UA: 6 (ref 5.0–7.5)

## 2022-02-13 LAB — MICROSCOPIC EXAMINATION
RBC, Urine: >30 /hpf — AB (ref 0–2)
Renal Epithel, UA: NONE SEEN /hpf
WBC, UA: >30 /hpf — AB (ref 0–5)

## 2022-02-13 MED ORDER — AMOXICILLIN-POT CLAVULANATE 875-125 MG PO TABS: 1.0000 | ORAL_TABLET | Freq: Two times a day (BID) | ORAL | 0 refills | Status: DC

## 2022-02-13 NOTE — Progress Notes (Signed)
Assessment: 1. Hematuria, unspecified type - Urinalysis, Routine w reflex microscopic - Urine Culture  2. Pyuria - Urine Culture  3. Dysuria - Urine Culture    Plan: As pt is now symptomatic, will tx previous cx with Augmentin. Repeat cx ordered. FU in 2 weeks for recheck urine and possible cysto. Will discuss pt presentation with Dr. Diona Fanti and consider CT hematuria study. CTAP done in 2021.   Chief Complaint: No chief complaint on file.   HPI: Peter Dunlap is a 66 y.o. male who presents for evaluation of acute onset gross hematuria with clots noted for the past 24 hours. Also c/o new onset low back pain. Mild burning. Urine has clear only a little today, but no clots since yesterday. Pt denies h/o gross hematuria in the past. No fever, chills, NV. No abdominal pain. No flank pain. Pt was seen in the ED for same yesterday, but eloped. Labs drawn in triage-creatinine 1.26 and stable, hgb 11.3, up from 8.9 2 months ago. No leukocytosis. Cx + on 01/31/22-group B, beta-hemolytic strep, greater than 100,000 K colonies. No antibx given. Pt was asymptomatic at that time. Currently denies taking anticoagulants except daily ASA.  UA= +30 WBCs, >30 RBCs, few bacteria, nitrite negative  01/31/22  History of Present Illness:  Presents for routine follow-up of LUTS--followup from 7.18.2023 appt.   Prostate cancer:    He underwent TRUS/Bx on 8.15.2017. At that time, PSA was 9.09. Prostatic volume was 25 cc. 10/12 cores came back positive for adenocarcinoma as follows: 1 core revealed GS 3+3 5 cores revealed GS 3+4 4 cores revealed GS 4+3   He completed IMRT, 40 sessions, on 1.10.2018   8.24.2021: Repeat TRUS/Bx for elevating PSA trend following definitive Rx.   9.7.2021: He is here to discuss his biopsy results.  1/12 cores-from the left mid lateral region-came back positive for adenocarcinoma.  Grading was difficult due to his prior radiation.     10.15.2021: -CT A/P  with  newly enlarged left iliac lymph nodes measuring 1.1 x 0.9 cm.  Hepatic steatosis with early signs of cirrhosis.   04/12/2020 -F-18 PSMA PET scan on showed radiotracer activity associated with enlarged left external iliac lymph node 9 mm with SUV 7.6.  Small left common iliac lymph node 6 mm, SUV 8.6.  Left periaortic lymph node 5 mm, SUV 6.3.  Lymph node between IVC and aorta at the level of right renal hilum, SUV 8.6.  Lymph node deep to the IVC measuring 6 mm, SUV 5.4.  No skeletal metastasis.   11.17.2021: Began androgen deprivation therapy with [place].   7.5.2022:  He is on [place] on a monthly basis.  He is also on [place].  Most recent PSA is less than 0.1.  Testosterone level 5.   11.9.2022: PSA < 0.01. T level castrate.  Still on monthly Firmagon 80 mg as well as Erleada.     7.18.2023: PSA is 0.01, testosterone level still castrate.  Still on [place].  He does complain of shortness of breath.  Most recent hemoglobin 8.9.       BPH     7.18.2023: He has been treated for BPH w/ LUTS w/ silodosin q 12 hrs.  Additionally, he apparently has been on Solifenacin although he is not familiar with this medication at the present time.  He is complaining mainly about his lower urinary tract symptoms.  IPSS 26, quality-of-life score 3.  Residual urine volume today 52 mL. I recommended that he  make sure that he is taking the solifenacin. Recommended that he limit caffeine intake and pm fluids.   8.29.2023: He is on dual therapy including Solifenacin daily and Rapaflo twice a day.  Still having nocturia x4-5.  Has not limited his evening fluids, however.  Has limited his caffeine.  Has some urgency but no urgency incontinence.  He has had no gross hematuria.  Slight occasional dysuria.    Portions of the above documentation were copied from a prior visit for review purposes only.  Allergies: Allergies  Allergen Reactions   Trazodone And Nefazodone     Nightmares    Lactose Intolerance (Gi) Other (See Comments)    UPSET STOMACH     PMH: Past Medical History:  Diagnosis Date   Arteriosclerotic cardiovascular disease (ASCVD) 2005   catheterization in 10/2010:50% mid LAD, diffuse distal disease, circumflex irregularities, large dominant RCA with a 50% ostial, 70% distal, 60% posterolateral and 70% PDA; normal EF   Arthritis    Benign prostatic hypertrophy    Bilateral carpal tunnel syndrome 07/03/2018   Cerebrovascular disease 2010   R. carotid endarterectomy; Duplex in 10/2010-widely patent ICAs, subtotal left vertebral-not thought to be contributing to symptoms   Cervical spine disease    CT in 2012-advanced degeneration and spondylosis with moderate spinal stenosis at C3-C6   CHF (congestive heart failure) ([place])    Depression    Diabetes mellitus without complication ([place])    Erectile dysfunction    Family history of breast cancer    Family history of cancer of mouth    Family history of CML (chronic monocytic leukemia)    Family history of lung cancer    Family history of ovarian cancer    Family history of stomach cancer    Gastroesophageal reflux disease    H/O hiatal hernia    H/O: substance abuse ([place])    Cocaine, marijuana, alcohol.  Quit 2013.    Hyperlipidemia    Hypertension    Non-ST elevation myocardial infarction (NSTEMI), initial episode of care ([place]) 12/02/2013   DES LAD   Obesity    Prostate cancer ([place])    Sleep apnea    CPAP   Tachy-brady syndrome ([place])    a. 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 25, 2010    CEA   COLONOSCOPY WITH PROPOFOL  N/A 06/24/2021   Procedure: COLONOSCOPY WITH PROPOFOL;  Surgeon: Carol Ada, MD;  Location: [facility] ENDOSCOPY;  Service: Endoscopy;  Laterality: N/A;   CORONARY ANGIOPLASTY WITH STENT PLACEMENT  12/03/2013   LAD 90%-->0% W/ Promus Premier DES 3.0 mm x 16 mm, CFX OK, RCA 40%, EF 70-75%   LEFT ATRIAL APPENDAGE OCCLUSION N/A 08/05/2015   Procedure: LEFT ATRIAL APPENDAGE OCCLUSION;  Surgeon: [name], MD;  Location: [facility] CV LAB;  Service: Cardiovascular;  Laterality: N/A;   LEFT HEART CATH AND CORONARY ANGIOGRAPHY N/A 09/13/2021   Procedure: LEFT HEART CATH AND CORONARY ANGIOGRAPHY;  Surgeon: Early Osmond, MD;  Location: [facility] CV LAB;  Service: Cardiovascular;  Laterality: N/A;  LEFT HEART CATHETERIZATION WITH CORONARY ANGIOGRAM Left 12/03/2013   Procedure: LEFT HEART CATHETERIZATION WITH CORONARY ANGIOGRAM;  Surgeon: Leonie Man, MD;  Location: [facility] CATH LAB;  Service: Cardiovascular;  Laterality: Left;   LEFT HEART CATHETERIZATION WITH CORONARY ANGIOGRAM N/A 01/26/2014   Procedure: LEFT HEART CATHETERIZATION WITH CORONARY ANGIOGRAM;  Surgeon: Jettie Booze, MD;  Location: [facility] CATH LAB;  Service: Cardiovascular;  Laterality: N/A;   LEFT HEART CATHETERIZATION WITH CORONARY ANGIOGRAM N/A 08/03/2014   Procedure: LEFT HEART CATHETERIZATION WITH CORONARY ANGIOGRAM;  Surgeon: Burnell Blanks, MD;  Location: [facility] CATH LAB;  Service: Cardiovascular;  Laterality: N/A;   PERCUTANEOUS CORONARY STENT INTERVENTION (PCI-S)  12/03/2013   Procedure: PERCUTANEOUS CORONARY STENT INTERVENTION (PCI-S);  Surgeon: Leonie Man, MD;  Location: [facility] CATH LAB;  Service: Cardiovascular;;   PERMANENT PACEMAKER INSERTION N/A 09/18/2014   Procedure: PERMANENT PACEMAKER INSERTION;  Surgeon: [name], MD;  Location: [facility] CATH LAB;  Service: Cardiovascular;  Laterality: N/A;   POLYPECTOMY  06/24/2021   Procedure: POLYPECTOMY;  Surgeon: Carol Ada, MD;  Location: [facility] ENDOSCOPY;  Service: Endoscopy;;    RADIOFREQUENCY ABLATION  2005   for PSVT   TEE WITHOUT CARDIOVERSION N/A 07/27/2015   Procedure: TRANSESOPHAGEAL ECHOCARDIOGRAM (TEE);  Surgeon: Lelon Perla, MD;  Location: [facility] ENDOSCOPY;  Service: Cardiovascular;  Laterality: N/A;   TEE WITHOUT CARDIOVERSION N/A 09/15/2015   Procedure: TRANSESOPHAGEAL ECHOCARDIOGRAM (TEE);  Surgeon: Thayer Headings, MD;  Location: [facility] ENDOSCOPY;  Service: Cardiovascular;  Laterality: N/A;    SH: Social History   Tobacco Use   Smoking status: Former    Packs/day: 1.00    Years: 40.00    Total pack years: 40.00    Types: Cigarettes    Start date: 10/20/1972    Quit date: 10/10/2012    Years since quitting: 9.3   Smokeless tobacco: Never   Tobacco comments:    Quit in May.   Vaping Use   Vaping Use: Never used  Substance Use Topics   Alcohol use: Yes    Alcohol/week: 0.0 standard drinks of alcohol    Comment: former drinker-- sober since 2013.    Drug use: No    Types: Cocaine    Comment: quit cocaine 10/2011    ROS: All other review of systems were reviewed and are negative except what is noted above in HPI  PE: BP 123/80   Pulse 88  GENERAL APPEARANCE:  Well appearing, well developed, well nourished, NAD HEENT:  Atraumatic, normocephalic NECK:  Supple. Trachea midline ABDOMEN:  Soft, non-tender, no masses EXTREMITIES:  Moves all extremities well, NEUROLOGIC:  Alert and oriented x 3, normal gait MENTAL STATUS:  appropriate BACK:  Non-tender to palpation, Mild left sided CVAT SKIN:  Warm, dry, and intact   Results: Laboratory Data: Lab Results  Component Value Date   WBC 7.3 02/12/2022   HGB 11.3 (L) 02/12/2022   HCT 35.8 (L) 02/12/2022   MCV 95.0 02/12/2022   PLT 135 (L) 02/12/2022    Lab Results  Component Value Date   CREATININE 1.26 (H) 02/12/2022    Lab Results  Component Value Date   PSA 1.4 11/10/2019   PSA 1.4 05/16/2019   PSA 10.05 (H) 11/05/2015    Lab Results  Component Value Date   TESTOSTERONE 12  (L) 06/20/2021    Lab Results  Component Value Date   HGBA1C 6.7 10/10/2021    Urinalysis    Component Value Date/Time   COLORURINE AMBER (A) 06/21/2016 0920  APPEARANCEUR Clear 01/31/2022 0926   LABSPEC 1.017 06/21/2016 0920   LABSPEC 1.010 04/17/2016 1126   PHURINE 9.0 (H) 06/21/2016 0920   GLUCOSEU Negative 01/31/2022 0926   GLUCOSEU Negative 04/17/2016 1126   HGBUR LARGE (A) 06/21/2016 0920   BILIRUBINUR Negative 01/31/2022 0926   BILIRUBINUR Color Interference 04/17/2016 1126   KETONESUR NEGATIVE 06/21/2016 0920   PROTEINUR 2+ (A) 01/31/2022 0926   PROTEINUR 100 (A) 06/21/2016 0920   UROBILINOGEN 0.2 04/17/2016 1126   NITRITE Negative 01/31/2022 0926   NITRITE NEGATIVE 06/21/2016 0920   LEUKOCYTESUR 1+ (A) 01/31/2022 0926   LEUKOCYTESUR Large 04/17/2016 1126    Lab Results  Component Value Date   LABMICR See below: 01/31/2022   WBCUA >30 (A) 01/31/2022   LABEPIT 0-10 01/31/2022   MUCUS Present 01/31/2022   BACTERIA Few (A) 01/31/2022    Pertinent Imaging: No results found for this or any previous visit.  No results found for this or any previous visit.  No results found for this or any previous visit.  No results found for this or any previous visit.  No results found for this or any previous visit.  No results found for this or any previous visit.  No results found for this or any previous visit.  No results found for this or any previous visit.  No results found. However, due to the size of the patient record, not all encounters were searched. Please check Results Review for a complete set of results.

## 2022-02-15 ENCOUNTER — Telehealth: Payer: Self-pay

## 2022-02-15 LAB — URINE CULTURE

## 2022-02-15 NOTE — Telephone Encounter (Signed)
Made patient aware urine culture was negative and he can continued current antibox until complete. Patient voiced understanding.

## 2022-02-15 NOTE — Telephone Encounter (Signed)
-----   Message from Reynaldo Minium, [place] sent at 02/15/2022 11:01 AM EDT ----- Please let pt know his urine culture is negative and he is okay on current antibx until complete ----- Message ----- From: Interface, [facility] Lab Results In Sent: 02/13/2022  12:21 PM EDT To: Reynaldo Minium, PA-C

## 2022-02-16 ENCOUNTER — Other Ambulatory Visit: Payer: Self-pay | Admitting: *Deleted

## 2022-02-16 ENCOUNTER — Other Ambulatory Visit (HOSPITAL_COMMUNITY): Payer: Self-pay | Admitting: Hematology

## 2022-02-16 ENCOUNTER — Other Ambulatory Visit (HOSPITAL_COMMUNITY): Payer: Self-pay

## 2022-02-16 MED ORDER — ERLEADA 60 MG PO TABS
ORAL_TABLET | ORAL | 3 refills | Status: DC
  Filled 2022-02-16: qty 120, 30d supply, fill #0
  Filled 2022-03-16: qty 120, 30d supply, fill #1
  Filled 2022-04-28: qty 120, 30d supply, fill #2
  Filled 2022-05-23: qty 120, 30d supply, fill #3

## 2022-02-23 ENCOUNTER — Other Ambulatory Visit (HOSPITAL_COMMUNITY): Payer: Self-pay

## 2022-02-27 ENCOUNTER — Other Ambulatory Visit (HOSPITAL_COMMUNITY): Payer: Self-pay

## 2022-02-27 NOTE — Progress Notes (Signed)
Remote pacemaker transmission.   

## 2022-02-28 ENCOUNTER — Inpatient Hospital Stay: Payer: Medicare Other

## 2022-02-28 DIAGNOSIS — Z5111 Encounter for antineoplastic chemotherapy: Secondary | ICD-10-CM | POA: Diagnosis not present

## 2022-02-28 DIAGNOSIS — Z191 Hormone sensitive malignancy status: Secondary | ICD-10-CM

## 2022-02-28 DIAGNOSIS — D649 Anemia, unspecified: Secondary | ICD-10-CM

## 2022-02-28 LAB — COMPREHENSIVE METABOLIC PANEL
ALT: 14 U/L (ref 0–44)
AST: 26 U/L (ref 15–41)
Albumin: 3.6 g/dL (ref 3.5–5.0)
Alkaline Phosphatase: 97 U/L (ref 38–126)
Anion gap: 8 (ref 5–15)
BUN: 17 mg/dL (ref 8–23)
CO2: 20 mmol/L — ABNORMAL LOW (ref 22–32)
Calcium: 8.9 mg/dL (ref 8.9–10.3)
Chloride: 108 mmol/L (ref 98–111)
Creatinine, Ser: 1.31 mg/dL — ABNORMAL HIGH (ref 0.61–1.24)
GFR, Estimated: >60 mL/min (ref >60)
Glucose, Bld: 148 mg/dL — ABNORMAL HIGH (ref 70–99)
Potassium: 3.8 mmol/L (ref 3.5–5.1)
Sodium: 136 mmol/L (ref 135–145)
Total Bilirubin: 0.6 mg/dL (ref 0.3–1.2)
Total Protein: 7.6 g/dL (ref 6.5–8.1)

## 2022-02-28 LAB — IRON AND TIBC
Iron: 48 ug/dL (ref 45–182)
Saturation Ratios: 10 % — ABNORMAL LOW (ref 17.9–39.5)
TIBC: 486 ug/dL — ABNORMAL HIGH (ref 250–450)
UIBC: 438 ug/dL

## 2022-02-28 LAB — CBC WITH DIFFERENTIAL/PLATELET
Abs Immature Granulocytes: 0.02 10*3/uL (ref 0.00–0.07)
Basophils Absolute: 0.1 10*3/uL (ref 0.0–0.1)
Basophils Relative: 1 %
Eosinophils Absolute: 0.1 10*3/uL (ref 0.0–0.5)
Eosinophils Relative: 2 %
HCT: 34.7 % — ABNORMAL LOW (ref 39.0–52.0)
Hemoglobin: 11.2 g/dL — ABNORMAL LOW (ref 13.0–17.0)
Immature Granulocytes: 0 %
Lymphocytes Relative: 23 %
Lymphs Abs: 1.5 10*3/uL (ref 0.7–4.0)
MCH: 30 pg (ref 26.0–34.0)
MCHC: 32.3 g/dL (ref 30.0–36.0)
MCV: 93 fL (ref 80.0–100.0)
Monocytes Absolute: 0.7 10*3/uL (ref 0.1–1.0)
Monocytes Relative: 11 %
Neutro Abs: 4.3 10*3/uL (ref 1.7–7.7)
Neutrophils Relative %: 63 %
Platelets: 131 10*3/uL — ABNORMAL LOW (ref 150–400)
RBC: 3.73 MIL/uL — ABNORMAL LOW (ref 4.22–5.81)
RDW: 18.6 % — ABNORMAL HIGH (ref 11.5–15.5)
WBC: 6.7 10*3/uL (ref 4.0–10.5)
nRBC: 0 % (ref 0.0–0.2)

## 2022-02-28 LAB — PSA: Prostatic Specific Antigen: <0.01 ng/mL (ref 0.00–4.00)

## 2022-02-28 LAB — FERRITIN: Ferritin: 19 ng/mL — ABNORMAL LOW (ref 24–336)

## 2022-02-28 LAB — VITAMIN D 25 HYDROXY (VIT D DEFICIENCY, FRACTURES): Vit D, 25-Hydroxy: 106.09 ng/mL — ABNORMAL HIGH (ref 30–100)

## 2022-03-06 ENCOUNTER — Other Ambulatory Visit (HOSPITAL_COMMUNITY): Payer: Self-pay

## 2022-03-07 ENCOUNTER — Inpatient Hospital Stay: Payer: Medicare Other | Attending: Hematology | Admitting: Hematology

## 2022-03-07 ENCOUNTER — Inpatient Hospital Stay: Payer: Medicare Other

## 2022-03-07 VITALS — BP 131/76 | HR 83 | Temp 97.4°F | Resp 18 | Ht 72.0 in | Wt 293.5 lb

## 2022-03-07 DIAGNOSIS — R0602 Shortness of breath: Secondary | ICD-10-CM | POA: Diagnosis not present

## 2022-03-07 DIAGNOSIS — I110 Hypertensive heart disease with heart failure: Secondary | ICD-10-CM | POA: Insufficient documentation

## 2022-03-07 DIAGNOSIS — Z806 Family history of leukemia: Secondary | ICD-10-CM | POA: Diagnosis not present

## 2022-03-07 DIAGNOSIS — E785 Hyperlipidemia, unspecified: Secondary | ICD-10-CM | POA: Diagnosis not present

## 2022-03-07 DIAGNOSIS — C61 Malignant neoplasm of prostate: Secondary | ICD-10-CM | POA: Insufficient documentation

## 2022-03-07 DIAGNOSIS — Z801 Family history of malignant neoplasm of trachea, bronchus and lung: Secondary | ICD-10-CM | POA: Insufficient documentation

## 2022-03-07 DIAGNOSIS — D509 Iron deficiency anemia, unspecified: Secondary | ICD-10-CM | POA: Insufficient documentation

## 2022-03-07 DIAGNOSIS — Z5111 Encounter for antineoplastic chemotherapy: Secondary | ICD-10-CM | POA: Insufficient documentation

## 2022-03-07 DIAGNOSIS — Z191 Hormone sensitive malignancy status: Secondary | ICD-10-CM | POA: Diagnosis not present

## 2022-03-07 DIAGNOSIS — Z87891 Personal history of nicotine dependence: Secondary | ICD-10-CM | POA: Diagnosis not present

## 2022-03-07 DIAGNOSIS — Z800 Family history of malignant neoplasm of digestive organs: Secondary | ICD-10-CM | POA: Diagnosis not present

## 2022-03-07 DIAGNOSIS — Z808 Family history of malignant neoplasm of other organs or systems: Secondary | ICD-10-CM | POA: Diagnosis not present

## 2022-03-07 DIAGNOSIS — Z79899 Other long term (current) drug therapy: Secondary | ICD-10-CM | POA: Insufficient documentation

## 2022-03-07 DIAGNOSIS — E039 Hypothyroidism, unspecified: Secondary | ICD-10-CM | POA: Insufficient documentation

## 2022-03-07 DIAGNOSIS — D696 Thrombocytopenia, unspecified: Secondary | ICD-10-CM | POA: Insufficient documentation

## 2022-03-07 DIAGNOSIS — Z8249 Family history of ischemic heart disease and other diseases of the circulatory system: Secondary | ICD-10-CM | POA: Insufficient documentation

## 2022-03-07 DIAGNOSIS — Z833 Family history of diabetes mellitus: Secondary | ICD-10-CM | POA: Insufficient documentation

## 2022-03-07 DIAGNOSIS — Z809 Family history of malignant neoplasm, unspecified: Secondary | ICD-10-CM | POA: Insufficient documentation

## 2022-03-07 DIAGNOSIS — K7460 Unspecified cirrhosis of liver: Secondary | ICD-10-CM | POA: Diagnosis not present

## 2022-03-07 DIAGNOSIS — Z803 Family history of malignant neoplasm of breast: Secondary | ICD-10-CM | POA: Insufficient documentation

## 2022-03-07 DIAGNOSIS — M8580 Other specified disorders of bone density and structure, unspecified site: Secondary | ICD-10-CM | POA: Diagnosis not present

## 2022-03-07 DIAGNOSIS — K5900 Constipation, unspecified: Secondary | ICD-10-CM | POA: Insufficient documentation

## 2022-03-07 DIAGNOSIS — E669 Obesity, unspecified: Secondary | ICD-10-CM | POA: Diagnosis not present

## 2022-03-07 MED ORDER — DEGARELIX ACETATE 80 MG ~~LOC~~ SOLR
80.0000 mg | Freq: Once | SUBCUTANEOUS | Status: AC
  Administered 2022-03-07: 80 mg via SUBCUTANEOUS
  Filled 2022-03-07: qty 4

## 2022-03-07 NOTE — Progress Notes (Signed)
Peter Dunlap presents today for Firmagon injection per the provider's orders.  Stable during administration without incident; injection site WNL; see MAR for injection details.  Patient tolerated procedure well and without incident.  No questions or complaints noted at this time.

## 2022-03-07 NOTE — Patient Instructions (Signed)
[facility]  Discharge Instructions: Thank you for choosing [facility] to provide your oncology and hematology care.  If you have a lab appointment with the [facility], please come in thru the Main Entrance and check in at the main information desk.  Wear comfortable clothing and clothing appropriate for easy access to any Portacath or PICC line.   We strive to give you quality time with your provider. You may need to reschedule your appointment if you arrive late (15 or more minutes).  Arriving late affects you and other patients whose appointments are after yours.  Also, if you miss three or more appointments without notifying the office, you may be dismissed from the clinic at the provider's discretion.      For prescription refill requests, have your pharmacy contact our office and allow 72 hours for refills to be completed.    Today you received the following chemotherapy and/or immunotherapy agents firmagon      To help prevent nausea and vomiting after your treatment, we encourage you to take your nausea medication as directed.  BELOW ARE SYMPTOMS THAT SHOULD BE REPORTED IMMEDIATELY: *FEVER GREATER THAN 100.4 F (38 C) OR HIGHER *CHILLS OR SWEATING *NAUSEA AND VOMITING THAT IS NOT CONTROLLED WITH YOUR NAUSEA MEDICATION *UNUSUAL SHORTNESS OF BREATH *UNUSUAL BRUISING OR BLEEDING *URINARY PROBLEMS (pain or burning when urinating, or frequent urination) *BOWEL PROBLEMS (unusual diarrhea, constipation, pain near the anus) TENDERNESS IN MOUTH AND THROAT WITH OR WITHOUT PRESENCE OF ULCERS (sore throat, sores in mouth, or a toothache) UNUSUAL RASH, SWELLING OR PAIN  UNUSUAL VAGINAL DISCHARGE OR ITCHING   Items with * indicate a potential emergency and should be followed up as soon as possible or go to the Emergency Department if any problems should occur.  Please show the CHEMOTHERAPY ALERT CARD or IMMUNOTHERAPY ALERT CARD at check-in to the  Emergency Department and triage nurse.  Should you have questions after your visit or need to cancel or reschedule your appointment, please contact [facility] [phone]  and follow the prompts.  Office hours are 8:00 a.m. to 4:30 p.m. Monday - Friday. Please note that voicemails left after 4:00 p.m. may not be returned until the following business day.  We are closed weekends and major holidays. You have access to a nurse at all times for urgent questions. Please call the main number to the clinic [phone] and follow the prompts.  For any non-urgent questions, you may also contact your provider using MyChart. We now offer e-Visits for anyone 30 and older to request care online for non-urgent symptoms. For details visit [email or web address].   Also download the MyChart app! Go to the app store, search "MyChart", open the app, select [facility], and log in with your MyChart username and password.  Masks are optional in the cancer centers. If you would like for your care team to wear a mask while they are taking care of you, please let them know. You may have one support person who is at least 66 years old accompany you for your appointments.

## 2022-03-07 NOTE — Patient Instructions (Signed)
[facility]  Discharge Instructions  You were seen and examined today by Dr. Delton Coombes.  Dr. Delton Coombes discussed your most recent lab work which revealed that everything looks good except your Vitamin D is elevated and your iron is low. The iron is low because your body has used it to make new blood. We will recheck your iron in 3 months and if needed will schedule for IV iron at that time.  Start taking the Vitamin D 50000 IU once every other week.   Follow-up as scheduled in 3 months with labs.    Thank you for choosing [facility] to provide your oncology and hematology care.   To afford each patient quality time with our provider, please arrive at least 15 minutes before your scheduled appointment time. You may need to reschedule your appointment if you arrive late (10 or more minutes). Arriving late affects you and other patients whose appointments are after yours.  Also, if you miss three or more appointments without notifying the office, you may be dismissed from the clinic at the provider's discretion.    Again, thank you for choosing [facility].  Our hope is that these requests will decrease the amount of time that you wait before being seen by our physicians.   If you have a lab appointment with the [facility] please come in thru the Main Entrance and check in at the main information desk.           _____________________________________________________________  Should you have questions after your visit to [facility], please contact our office at [phone] and follow the prompts.  Our office hours are 8:00 a.m. to 4:30 p.m. Monday - Thursday and 8:00 a.m. to 2:30 p.m. Friday.  Please note that voicemails left after 4:00 p.m. may not be returned until the following business day.  We are closed weekends and all major holidays.  You do have access to a nurse 24-7, just call the main number to  the clinic [phone] and do not press any options, hold on the line and a nurse will answer the phone.    For prescription refill requests, have your pharmacy contact our office and allow 72 hours.    Masks are optional in the cancer centers. If you would like for your care team to wear a mask while they are taking care of you, please let them know. You may have one support person who is at least 66 years old accompany you for your appointments.

## 2022-03-09 ENCOUNTER — Other Ambulatory Visit (HOSPITAL_COMMUNITY): Payer: Self-pay

## 2022-03-14 ENCOUNTER — Ambulatory Visit: Payer: Medicare Other | Admitting: Urology

## 2022-03-16 ENCOUNTER — Other Ambulatory Visit (HOSPITAL_COMMUNITY): Payer: Self-pay

## 2022-03-17 ENCOUNTER — Encounter (HOSPITAL_COMMUNITY): Payer: Self-pay | Admitting: Hematology

## 2022-03-17 NOTE — Progress Notes (Signed)
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 A variant of uncertain significance (VUS) was detected in the NF1 gene called c.1178A>G. The report date is 05/23/2020.  The Common Hereditary Cancers Panel offered by [facility] includes sequencing and/or deletion duplication testing of the following 47 genes: APC, ATM, AXIN2, BARD1, BMPR1A, BRCA1, BRCA2, BRIP1, CDH1, CDK4, CDKN2A (p14ARF), CDKN2A (p16INK4a), CHEK2, CTNNA1, DICER1, EPCAM (Deletion/duplication testing only), GREM1 (promoter region deletion/duplication testing only), KIT, MEN1, MLH1, MSH2, MSH3, MSH6, MUTYH, NBN, NF1, NTHL1, PALB2, PDGFRA, PMS2, POLD1, POLE, PTEN, RAD50, RAD51C, RAD51D, SDHB, SDHC, SDHD, SMAD4, SMARCA4. STK11, TP53, TSC1, TSC2, and VHL.  The following genes were evaluated for sequence changes only: SDHA and HOXB13 c.251G>A variant only. The Prostate Cancer HRR Panel offered by [facility] includes sequencing and/or deletion duplication analysis of the following 10 genes: ATM, BARD1, BRCA1, BRCA2, BRIP1, CHEK2, FANCL, PALB2, RAD51C, RAD51D.     CANCER STAGING:  Cancer Staging  No matching staging information was found for the patient.  INTERVAL HISTORY:  Peter Dunlap, a 66 y.o. male, returns for follow-up of metastatic castration sensitive prostate  cancer.  He is tolerating Erleada very well.  She received Feraheme on 12/21/2021 and 12/28/2021.  Is taking vitamin D 50,000 units weekly.  REVIEW OF SYSTEMS:  Review of Systems  Constitutional:  Negative for appetite change.  Respiratory:  Positive for shortness of breath.   Gastrointestinal:  Positive for constipation.  Genitourinary:  Positive for difficulty urinating.   All other systems reviewed and are negative.   PAST MEDICAL/SURGICAL HISTORY:  Past Medical History:  Diagnosis Date   Arteriosclerotic cardiovascular disease (ASCVD) 2005   catheterization in 10/2010:50% mid LAD, diffuse distal disease, circumflex irregularities, large dominant RCA with a 50% ostial, 70% distal, 60% posterolateral and 70% PDA; normal EF   Arthritis    Benign prostatic hypertrophy    Bilateral carpal tunnel syndrome 07/03/2018   Cerebrovascular disease 2010   R. carotid endarterectomy; Duplex in 10/2010-widely patent ICAs, subtotal left vertebral-not thought to be contributing to symptoms   Cervical spine disease    CT in 2012-advanced degeneration and spondylosis with moderate spinal stenosis at C3-C6   CHF (congestive heart failure) ([place])    Depression    Diabetes mellitus without complication ([place])    Erectile dysfunction    Family history of breast cancer    Family history of cancer of mouth    Family history of CML (chronic monocytic leukemia)    Family history of lung cancer    Family history of ovarian cancer    Family history of stomach cancer    Gastroesophageal reflux disease    H/O hiatal hernia    H/O: substance abuse ([place])    Cocaine, marijuana, alcohol.  Quit 2013.    Hyperlipidemia    Hypertension    Non-ST elevation myocardial infarction (NSTEMI), initial episode of care (  [place]) 12/02/2013   DES LAD   Obesity    Prostate cancer ([place])    Sleep apnea    CPAP   Tachy-brady syndrome ([place])    a. 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 25, 2010    CEA   COLONOSCOPY WITH PROPOFOL N/A 06/24/2021   Procedure: COLONOSCOPY WITH PROPOFOL;  Surgeon: Carol Ada, MD;  Location: [facility];  Service: Endoscopy;  Laterality: N/A;   CORONARY ANGIOPLASTY WITH STENT PLACEMENT  12/03/2013   LAD 90%-->0% W/ Promus Premier DES 3.0 mm x 16 mm, CFX OK, RCA 40%, EF 70-75%   LEFT ATRIAL APPENDAGE OCCLUSION N/A 08/05/2015   Procedure: LEFT ATRIAL APPENDAGE OCCLUSION;  Surgeon: [name], MD;  Location: [facility];  Service: Cardiovascular;  Laterality: N/A;   LEFT HEART CATH AND CORONARY ANGIOGRAPHY N/A 09/13/2021   Procedure: LEFT HEART CATH AND CORONARY ANGIOGRAPHY;  Surgeon: Early Osmond, MD;  Location: [facility];  Service: Cardiovascular;  Laterality: N/A;   LEFT HEART CATHETERIZATION WITH CORONARY ANGIOGRAM Left 12/03/2013   Procedure: LEFT HEART CATHETERIZATION WITH CORONARY ANGIOGRAM;  Surgeon: Leonie Man, MD;  Location: [facility] CATH LAB;  Service: Cardiovascular;  Laterality: Left;   LEFT HEART CATHETERIZATION WITH CORONARY ANGIOGRAM N/A 01/26/2014   Procedure: LEFT HEART CATHETERIZATION WITH CORONARY ANGIOGRAM;  Surgeon: Jettie Booze, MD;  Location: [facility] CATH LAB;  Service: Cardiovascular;  Laterality: N/A;   LEFT HEART CATHETERIZATION WITH CORONARY ANGIOGRAM N/A 08/03/2014   Procedure: LEFT HEART CATHETERIZATION WITH CORONARY ANGIOGRAM;  Surgeon: Burnell Blanks, MD;  Location: [facility] CATH LAB;  Service: Cardiovascular;  Laterality: N/A;   PERCUTANEOUS CORONARY STENT INTERVENTION (PCI-S)  12/03/2013   Procedure: PERCUTANEOUS CORONARY STENT INTERVENTION (PCI-S);  Surgeon: Leonie Man,  MD;  Location: [facility] CATH LAB;  Service: Cardiovascular;;   PERMANENT PACEMAKER INSERTION N/A 09/18/2014   Procedure: PERMANENT PACEMAKER INSERTION;  Surgeon: [name], MD;  Location: [facility] CATH LAB;  Service: Cardiovascular;  Laterality: N/A;   POLYPECTOMY  06/24/2021   Procedure: POLYPECTOMY;  Surgeon: Carol Ada, MD;  Location: [facility];  Service: Endoscopy;;   RADIOFREQUENCY ABLATION  2005   for PSVT   TEE WITHOUT CARDIOVERSION N/A 07/27/2015   Procedure: TRANSESOPHAGEAL ECHOCARDIOGRAM (TEE);  Surgeon: Lelon Perla, MD;  Location: [facility] ENDOSCOPY;  Service: Cardiovascular;  Laterality: N/A;   TEE WITHOUT CARDIOVERSION N/A 09/15/2015   Procedure: TRANSESOPHAGEAL ECHOCARDIOGRAM (TEE);  Surgeon: Thayer Headings, MD;  Location: [facility] ENDOSCOPY;  Service: Cardiovascular;  Laterality: N/A;    SOCIAL HISTORY:  Social History   Socioeconomic History   Marital status: Married    Spouse name: Not on file   Number of children: 0   Years of education: Not on file   Highest education level: Not on file  Occupational History   Occupation: Retired  Tobacco Use   Smoking status: Former    Packs/day: 1.00    Years: 40.00    Total pack years: 40.00    Types: Cigarettes    Start date: 10/20/1972  Quit date: 10/10/2012    Years since quitting: 9.4   Smokeless tobacco: Never   Tobacco comments:    Quit in May.   Vaping Use   Vaping Use: Never used  Substance and Sexual Activity   Alcohol use: Yes    Alcohol/week: 0.0 standard drinks of alcohol    Comment: former drinker-- sober since 2013.    Drug use: No    Types: Cocaine    Comment: quit cocaine 10/2011   Sexual activity: Yes    Partners: Female  Other Topics Concern   Not on file  Social History Narrative   Lives in [place].   Social Determinants of Health   Financial Resource Strain: Low Risk  (04/19/2020)   Overall Financial Resource Strain (CARDIA)    Difficulty of Paying Living Expenses: Not hard at all  Food  Insecurity: No Food Insecurity (04/19/2020)   Hunger Vital Sign    Worried About Running Out of Food in the Last Year: Never true    Ran Out of Food in the Last Year: Never true  Transportation Needs: No Transportation Needs (04/19/2020)   PRAPARE - Hydrologist (Medical): No    Lack of Transportation (Non-Medical): No  Physical Activity: Inactive (04/19/2020)   Exercise Vital Sign    Days of Exercise per Week: 0 days    Minutes of Exercise per Session: 0 min  Stress: No Stress Concern Present (04/19/2020)   [place]    Feeling of Stress : Not at all  Social Connections: Moderately Isolated (04/19/2020)   Social Connection and Isolation Panel [NHANES]    Frequency of Communication with Friends and Family: More than three times a week    Frequency of Social Gatherings with Friends and Family: Once a week    Attends Religious Services: Never    Marine scientist or Organizations: No    Attends Archivist Meetings: Never    Marital Status: Married  Human resources officer Violence: Not At Risk (04/19/2020)   Humiliation, Afraid, Rape, and Kick questionnaire    Fear of Current or Ex-Partner: No    Emotionally Abused: No    Physically Abused: No    Sexually Abused: No    FAMILY HISTORY:  Family History  Problem Relation Age of Onset   Hypertension Mother        Cerebrovascular disease   Diabetes Mother    Coronary artery disease Father 33   Diabetes type II Father    Hypertension Father    Heart attack Father    Diabetes Brother    Hypertension Brother    Diabetes Sister    Hypertension Sister    Heart attack Sister 20   Leukemia Sister 81       CML   Breast cancer Maternal Grandmother        dx 40s   Lung cancer Maternal Uncle        dx >50, smoker   Cancer Cousin        mouth cancer, dx 63s, no smoking/chew tobacco hx (maternal 1st cousin)   Ovarian cancer Cousin         dx <50 (maternal 1st cousin)   Stomach cancer Cousin        dx 14s (maternal 1st cousin)   Cancer Cousin        type unknown to pt, dx >50 (paternal 1st cousin)    CURRENT MEDICATIONS:  Current Outpatient  Medications  Medication Sig Dispense Refill   allopurinol (ZYLOPRIM) 100 MG tablet TAKE 1 TABLET(100 MG) BY MOUTH DAILY 90 tablet 0   apalutamide (ERLEADA) 60 MG tablet TAKE 4 TABLETS (240 MG TOTAL) BY MOUTH DAILY. MAY BE TAKEN WITH OR WITHOUT FOOD. SWALLOW TABLETS WHOLE. 120 tablet 3   aspirin EC 325 MG tablet Take 1 tablet (325 mg total) daily by mouth. 30 tablet 0   Blood Glucose Monitoring Suppl (BLOOD GLUCOSE SYSTEM PAK) KIT Use as directed to monitor FSBS 1x daily. Dx: E11.9. 1 kit 1   Cyanocobalamin (VITAMIN B 12) 500 MCG TABS Take 500 mcg by mouth in the morning.     diclofenac Sodium (VOLTAREN) 1 % GEL 2 g 2 (two) times daily.     FARXIGA 5 MG TABS tablet Take 5 mg by mouth daily.     fluticasone (FLONASE) 50 MCG/ACT nasal spray Place 2 sprays into both nostrils daily. 16 g 6   furosemide (LASIX) 20 MG tablet TAKE 3 TABLETS BY MOUTH DAILY 270 tablet 3   glipiZIDE (GLUCOTROL XL) 5 MG 24 hr tablet Take 1 tablet (5 mg total) by mouth daily with breakfast. 90 tablet 3   glucose blood (ONETOUCH ULTRA) test strip Use as instructed to monitor glucose twice daily. 100 strip 3   isosorbide mononitrate (IMDUR) 30 MG 24 hr tablet Take 1 tablet (30 mg total) by mouth daily. 90 tablet 3   Lactulose 20 GM/30ML SOLN Take 30 mLs (20 g total) by mouth daily as needed. Take 30 ml by mouth every 3 hours until you have bowel movement then daily as needed 450 mL 3   levothyroxine (SYNTHROID) 200 MCG tablet Take 1 tablet (200 mcg total) by mouth daily before breakfast. 90 tablet 3   levothyroxine (SYNTHROID) 25 MCG tablet Take 1 tablet (25 mcg total) by mouth See admin instructions. Takes along with 200 mcg to total 225 mcg daily 90 tablet 3   loratadine (CLARITIN) 10 MG tablet TAKE 1 TABLET(10  MG) BY MOUTH DAILY AS NEEDED FOR ALLERGIES 30 tablet 2   magnesium oxide (MAG-OX) 400 (240 Mg) MG tablet TAKE 1 TABLET(400 MG) BY MOUTH TWICE DAILY 180 tablet 2   metFORMIN (GLUCOPHAGE) 500 MG tablet Take 1 tablet (500 mg total) by mouth daily with breakfast. 90 tablet 3   naloxone (NARCAN) nasal spray 4 mg/0.1 mL Place 4 mg into the nose once as needed (accidental overdose).     nitroGLYCERIN (NITROSTAT) 0.4 MG SL tablet PLACE 1 TABLET UNDER THE TONGUE EVERY 5 MINUTES AS NEEDED FOR CHEST PAIN. CALL 911 AT THIRD DOSE IN 15 MINUTES 25 tablet 1   oxyCODONE-acetaminophen (PERCOCET) 10-325 MG tablet Take 1 tablet by mouth every 8 (eight) hours as needed for pain. 90 tablet 0   pantoprazole (PROTONIX) 40 MG tablet TAKE 1 TABLET(40 MG) BY MOUTH DAILY 30 tablet 3   potassium chloride SA (KLOR-CON M) 20 MEQ tablet Take 1 tablet (20 mEq total) by mouth daily. 30 tablet 3   rosuvastatin (CRESTOR) 40 MG tablet Take 40 mg by mouth every evening.     silodosin (RAPAFLO) 8 MG CAPS capsule TAKE 1 CAPSULE(8 MG) BY MOUTH TWICE DAILY 180 capsule 3   solifenacin (VESICARE) 10 MG tablet Take 1 tablet (10 mg total) by mouth daily. 30 tablet 11   sotalol (BETAPACE) 80 MG tablet Take 1 tablet (80 mg total) by mouth 2 (two) times daily. 180 tablet 3   Vitamin D, Ergocalciferol, (DRISDOL) 1.25 MG (50000 UNIT)  CAPS capsule TAKE 1 CAPSULE BY MOUTH EVERY 7 DAYS 12 capsule 0   No current facility-administered medications for this visit.    ALLERGIES:  Allergies  Allergen Reactions   Trazodone And Nefazodone     Nightmares   Lactose Intolerance (Gi) Other (See Comments)    UPSET STOMACH     PHYSICAL EXAM:  Performance status (ECOG): 1 - Symptomatic but completely ambulatory  Vitals:   03/07/22 1505  BP: 131/76  Pulse: 83  Resp: 18  Temp: (!) 97.4 F (36.3 C)  SpO2: 100%   Wt Readings from Last 3 Encounters:  03/07/22 293 lb 8 oz (133.1 kg)  01/10/22 287 lb 14.4 oz (130.6 kg)  12/20/21 288 lb (130.6 kg)    Physical Exam Vitals reviewed.  Constitutional:      Appearance: Normal appearance. He is obese.  Cardiovascular:     Rate and Rhythm: Normal rate and regular rhythm.     Pulses: Normal pulses.     Heart sounds: Normal heart sounds.  Pulmonary:     Effort: Pulmonary effort is normal.     Breath sounds: Normal breath sounds.  Neurological:     General: No focal deficit present.     Mental Status: He is alert and oriented to person, place, and time.  Psychiatric:        Mood and Affect: Mood normal.        Behavior: Behavior normal.      LABORATORY DATA:  I have reviewed the labs as listed.     Latest Ref Rng & Units 02/28/2022   12:00 PM 02/12/2022    1:49 AM 12/08/2021    1:29 PM  CBC  WBC 4.0 - 10.5 K/uL 6.7  7.3  6.7   Hemoglobin 13.0 - 17.0 g/dL 11.2  11.3  8.9   Hematocrit 39.0 - 52.0 % 34.7  35.8  29.0   Platelets 150 - 400 K/uL 131  135  136       Latest Ref Rng & Units 02/28/2022   12:00 PM 02/12/2022    1:49 AM 12/08/2021    1:29 PM  CMP  Glucose 70 - 99 mg/dL 148  139  99   BUN 8 - 23 mg/dL $Remove'17  8  15   'xHaCBla$ Creatinine 0.61 - 1.24 mg/dL 1.31  1.26  1.02   Sodium 135 - 145 mmol/L 136  139  140   Potassium 3.5 - 5.1 mmol/L 3.8  4.2  4.2   Chloride 98 - 111 mmol/L 108  108  113   CO2 22 - 32 mmol/L $RemoveB'20  21  20   'BRYyzYNP$ Calcium 8.9 - 10.3 mg/dL 8.9  9.3  9.2   Total Protein 6.5 - 8.1 g/dL 7.6  7.1  7.4   Total Bilirubin 0.3 - 1.2 mg/dL 0.6  0.4  0.5   Alkaline Phos 38 - 126 U/L 97  86  70   AST 15 - 41 U/L $Remo'26  23  17   'VUnDV$ ALT 0 - 44 U/L $Remo'14  10  8     'daYsp$ DIAGNOSTIC IMAGING:  I have independently reviewed the scans and discussed with the patient. No results found.   ASSESSMENT:  1.  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 -Status post IMRT, 40 sessions completed on 06/14/2016 by Dr. Tammi Klippel. -PSA 0.7  (10/15/2018), 0.8 (02/18/2019), 1.4 (December 2020), 1.4 (June  2021) -Prostate biopsy on 02/10/2020-1 out of 12 cores from left mid lateral region positive for adenocarcinoma. -CTAP on 03/09/2020 with newly enlarged left iliac lymph nodes measuring 1.1 x 0.9 cm.  Hepatic steatosis with early signs of cirrhosis. -F-18 PSMA PET scan on 04/12/2020 showed radiotracer activity associated with enlarged left external iliac lymph node 9 mm with SUV 7.6.  Small left common iliac lymph node 6 mm, SUV 8.6.  Left periaortic lymph node 5 mm, SUV 6.3.  Lymph node between IVC and aorta at the level of right renal hilum, SUV 8.6.  Lymph node deep to the IVC measuring 6 mm, SUV 5.4.  No skeletal metastasis. 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 - Firmagon started on 04/21/2020 - Apalutamide 240 milligrams daily started around 04/22/2020.   2.  Social/family history: -He is a retired Nature conservation officer.  He lives at home with his wife.  He plays golf.  He quit smoking in 2015, smoked 1 pack/day for more than 20 years. -Sister has CML.  Maternal grandmother had breast cancer.  Maternal uncle had lung cancer.  2 maternal cousins had tongue cancer and uterine cancer.   3.  Cirrhosis: -Prior imaging showed cirrhosis of the liver with normal spleen.   PLAN:  1.  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 - Reviewed labs today which showed normal LFTs.  PSA was undetectable. - Continue same dose of apalutamide.  Continue degarelix today. - RTC 3 months for follow-up with repeat PSA.   2.  Normocytic anemia: - He received Feraheme on 12/21/2021 and 12/28/2021. - Hemoglobin has improved to 11.2.  Ferritin is 19 and percent saturation 10. - We will repeat ferritin and iron panel at next visit.   3.  Mild thrombocytopenia: - Mild thrombocytopenia on and off since December 2020 has been stable.  Platelet count is  131.   4.  Hypothyroidism: - Continue Synthroid daily.  5.  Osteopenia (DEXA 11/15/2021 T score -1.6): - He is taking vitamin D 50,000 units weekly.  Vitamin D level is 100. - We will talk to him about initiating him on Prolia after dental work-up done.    Orders placed this encounter:  No orders of the defined types were placed in this encounter.    Derek Jack, MD [place] [phone]

## 2022-03-28 ENCOUNTER — Other Ambulatory Visit (HOSPITAL_COMMUNITY): Payer: Self-pay

## 2022-03-30 ENCOUNTER — Other Ambulatory Visit (HOSPITAL_COMMUNITY): Payer: Self-pay

## 2022-04-04 ENCOUNTER — Inpatient Hospital Stay: Payer: Medicare Other

## 2022-04-05 ENCOUNTER — Other Ambulatory Visit (HOSPITAL_COMMUNITY): Payer: Self-pay

## 2022-04-11 NOTE — Patient Instructions (Incomplete)

## 2022-04-12 ENCOUNTER — Other Ambulatory Visit (HOSPITAL_COMMUNITY): Payer: Self-pay

## 2022-04-12 ENCOUNTER — Ambulatory Visit: Payer: Medicare Other | Admitting: Nurse Practitioner

## 2022-04-12 DIAGNOSIS — E890 Postprocedural hypothyroidism: Secondary | ICD-10-CM

## 2022-04-12 DIAGNOSIS — E782 Mixed hyperlipidemia: Secondary | ICD-10-CM

## 2022-04-12 DIAGNOSIS — E1169 Type 2 diabetes mellitus with other specified complication: Secondary | ICD-10-CM

## 2022-04-12 DIAGNOSIS — I10 Essential (primary) hypertension: Secondary | ICD-10-CM

## 2022-04-12 DIAGNOSIS — E559 Vitamin D deficiency, unspecified: Secondary | ICD-10-CM

## 2022-04-18 ENCOUNTER — Ambulatory Visit: Payer: Medicare Other | Admitting: Nurse Practitioner

## 2022-04-18 NOTE — Patient Instructions (Incomplete)

## 2022-04-24 NOTE — Progress Notes (Signed)
History of Present Illness:     Prostate cancer:    He underwent TRUS/Bx on 8.15.2017. At that time, PSA was 9.09. Prostatic volume was 25 cc. 10/12 cores came back positive for adenocarcinoma as follows: 1 core revealed GS 3+3 5 cores revealed GS 3+4 4 cores revealed GS 4+3   He completed IMRT, 40 sessions, on 1.10.2018   8.24.2021: Repeat TRUS/Bx for elevating PSA trend following definitive Rx.   9.7.2021: He is here to discuss his biopsy results.  1/12 cores-from the left mid lateral region-came back positive for adenocarcinoma.  Grading was difficult due to his prior radiation.     10.15.2021: -CT A/P  with newly enlarged left iliac lymph nodes measuring 1.1 x 0.9 cm.  Hepatic steatosis with early signs of cirrhosis.   04/12/2020 -F-18 PSMA PET scan on showed radiotracer activity associated with enlarged left external iliac lymph node 9 mm with SUV 7.6.  Small left common iliac lymph node 6 mm, SUV 8.6.  Left periaortic lymph node 5 mm, SUV 6.3.  Lymph node between IVC and aorta at the level of right renal hilum, SUV 8.6.  Lymph node deep to the IVC measuring 6 mm, SUV 5.4.  No skeletal metastasis.   11.17.2021: Began androgen deprivation therapy with [place].   7.5.2022:  He is on [place] on a monthly basis.  He is also on [place].  Most recent PSA is less than 0.1.  Testosterone level 5.   11.9.2022: PSA < 0.01. T level castrate.  Still on monthly Firmagon 80 mg as well as Erleada.     7.18.2023: PSA is 0.01, testosterone level still castrate.  Still on [place].  He does complain of shortness of breath.  Most recent hemoglobin 8.9.       BPH     7.18.2023: He has been treated for BPH w/ LUTS w/ silodosin q 12 hrs.  Additionally, he apparently has been on Solifenacin although he is not familiar with this medication at the present time.  He is complaining mainly about his lower urinary tract symptoms.  IPSS 26, quality-of-life score 3.  Residual urine  volume today 52 mL. I recommended that he make sure that he is taking the solifenacin. Recommended that he limit caffeine intake and pm fluids.   8.29.2023: He is on dual therapy including Solifenacin daily and Rapaflo twice a day.  Still having nocturia x4-5.  Has not limited his evening fluids, however.  Has limited his caffeine.  Has some urgency but no urgency incontinence.  He has had no gross hematuria.  Slight occasional dysuria.     Past Medical History:  Diagnosis Date   Arteriosclerotic cardiovascular disease (ASCVD) 2005   catheterization in 10/2010:50% mid LAD, diffuse distal disease, circumflex irregularities, large dominant RCA with a 50% ostial, 70% distal, 60% posterolateral and 70% PDA; normal EF   Arthritis    Benign prostatic hypertrophy    Bilateral carpal tunnel syndrome 07/03/2018   Cerebrovascular disease 2010   R. carotid endarterectomy; Duplex in 10/2010-widely patent ICAs, subtotal left vertebral-not thought to be contributing to symptoms   Cervical spine disease    CT in 2012-advanced degeneration and spondylosis with moderate spinal stenosis at C3-C6   CHF (congestive heart failure) ([place])    Depression    Diabetes mellitus without complication ([place])    Erectile dysfunction    Family history of breast cancer    Family history of cancer of mouth    Family history of  CML (chronic monocytic leukemia)    Family history of lung cancer    Family history of ovarian cancer    Family history of stomach cancer    Gastroesophageal reflux disease    H/O hiatal hernia    H/O: substance abuse ([place])    Cocaine, marijuana, alcohol.  Quit 2013.    Hyperlipidemia    Hypertension    Non-ST elevation myocardial infarction (NSTEMI), initial episode of care ([place]) 12/02/2013   DES LAD   Obesity    Prostate cancer ([place])    Sleep apnea    CPAP   Tachy-brady syndrome ([place])    a. s/p STJ dual chamber PPM    Thyroid disease    Tobacco abuse    Quit 2014   Ulnar neuropathy at  elbow 07/03/2018   Bilateral    Past Surgical History:  Procedure Laterality Date   BRAIN SURGERY  2015   hematoma evacuation   BURR HOLE Right 04/13/2014   Procedure: Haskell Flirt;  Surgeon: Charlie Pitter, MD;  Location: [facility];  Service: Neurosurgery;  Laterality: Right;   CAROTID ENDARTERECTOMY Right Feb. 25, 2010    CEA   COLONOSCOPY WITH PROPOFOL N/A 06/24/2021   Procedure: COLONOSCOPY WITH PROPOFOL;  Surgeon: Carol Ada, MD;  Location: [facility] END[facility];  Service: Endoscopy;  Laterality: N/A;   CORONARY ANGIOPLASTY WITH STENT PLACEMENT  12/03/2013   LAD 90%-->0% W/ Promus Premier DES 3.0 mm x 16 mm, CFX OK, RCA 40%, EF 70-75%   LEFT ATRIAL APPENDAGE OCCLUSION N/A 08/05/2015   Procedure: LEFT ATRIAL APPENDAGE OCCLUSION;  Surgeon: [name], MD;  Location: [facility] CV LAB;  Service: Cardiovascular;  Laterality: N/A;   LEFT HEART CATH AND CORONARY ANGIOGRAPHY N/A 09/13/2021   Procedure: LEFT HEART CATH AND CORONARY ANGIOGRAPHY;  Surgeon: Early Osmond, MD;  Location: [facility] CV LAB;  Service: Cardiovascular;  Laterality: N/A;   LEFT HEART CATHETERIZATION WITH CORONARY ANGIOGRAM Left 12/03/2013   Procedure: LEFT HEART CATHETERIZATION WITH CORONARY ANGIOGRAM;  Surgeon: Leonie Man, MD;  Location: [facility] CATH LAB;  Service: Cardiovascular;  Laterality: Left;   LEFT HEART CATHETERIZATION WITH CORONARY ANGIOGRAM N/A 01/26/2014   Procedure: LEFT HEART CATHETERIZATION WITH CORONARY ANGIOGRAM;  Surgeon: Jettie Booze, MD;  [facility] C[facility] LAB;  Service: Cardiovascular;  Laterality: N/A;   LEFT HEART CATHETERIZATION WITH CORONARY ANGIOGRAM N/A 08/03/2014   Procedure: LEFT HEART CATHETERIZATION WITH CORONARY ANGIOGRAM;  Surgeon: Burnell Blanks, MD;  Location[facility] C[facility] LAB;  Service: Cardiovascular;  Laterality: N/A;   PERCUTANEOUS CORONARY STENT INTERVENTION (PCI-S)  12/03/2013   Procedure: PERCUTANEOUS CORONARY STENT INTERVENTION (PCI-S);  Surgeon: Leonie Man, MD;   Location: [facility] CATH LAB;  Service: Cardiovascular;;   PERMANENT PACEMAKER INSERTION N/A 09/18/2014   Procedure: PERMANENT PACEMAKER INSERTION;  Surgeon: [name], MD;  Location: [facility] CATH LAB;  Service: Cardiovascular;  Laterality: N/A;   POLYPECTOMY  06/24/2021   Procedure: POLYPECTOMY;  Surgeon: Carol Ada, MD;  Location: [facility] ENDOS[facility];  Service: Endoscopy;;   RADIOFREQUENCY ABLATION  2005   for PSVT   TEE WITHOUT CARDIOVERSION N/A 07/27/2015   Procedure: TRANSESOPHAGEAL ECHOCARDIOGRAM (TEE);  Surgeon: Lelon Perla, MD;  Location: [facility] ENDOSCOPY;  Service: Cardiovascular;  Laterality: N/A;   TEE WITHOUT CARDIOVERSION N/A 09/15/2015   Procedure: TRANSESOPHAGEAL ECHOCARDIOGRAM (TEE);  Surgeon: Thayer Headings, MD;  Location: [place];  Service: Cardiovascular;  Laterality: N/A;    Home Medications:  Allergies as of 04/25/2022  Reactions   Trazodone And Nefazodone    Nightmares   Lactose Intolerance (gi) Other (See Comments)   UPSET STOMACH        Medication List        Accurate as of April 24, 2022  7:47 PM. If you have any questions, ask your nurse or doctor.          allopurinol 100 MG tablet Commonly known as: ZYLOPRIM TAKE 1 TABLET(100 MG) BY MOUTH DAILY   aspirin EC 325 MG tablet Take 1 tablet (325 mg total) daily by mouth.   Blood Glucose System Pak Kit Use as directed to monitor FSBS 1x daily. Dx: E11.9.   diclofenac Sodium 1 % Gel Commonly known as: VOLTAREN 2 g 2 (two) times daily.   Erleada 60 MG tablet Generic drug: apalutamide TAKE 4 TABLETS (240 MG TOTAL) BY MOUTH DAILY. MAY BE TAKEN WITH OR WITHOUT FOOD. SWALLOW TABLETS WHOLE.   Farxiga 5 MG Tabs tablet Generic drug: dapagliflozin propanediol Take 5 mg by mouth daily.   fluticasone 50 MCG/ACT nasal spray Commonly known as: FLONASE Place 2 sprays into both nostrils daily.   furosemide 20 MG tablet Commonly known as: LASIX TAKE 3 TABLETS BY MOUTH DAILY   glipiZIDE 5 MG 24 hr  tablet Commonly known as: GLUCOTROL XL Take 1 tablet (5 mg total) by mouth daily with breakfast.   isosorbide mononitrate 30 MG 24 hr tablet Commonly known as: IMDUR Take 1 tablet (30 mg total) by mouth daily.   Lactulose 20 GM/30ML Soln Take 30 mLs (20 g total) by mouth daily as needed. Take 30 ml by mouth every 3 hours until you have bowel movement then daily as needed   levothyroxine 200 MCG tablet Commonly known as: SYNTHROID Take 1 tablet (200 mcg total) by mouth daily before breakfast.   levothyroxine 25 MCG tablet Commonly known as: SYNTHROID Take 1 tablet (25 mcg total) by mouth See admin instructions. Takes along with 200 mcg to total 225 mcg daily   loratadine 10 MG tablet Commonly known as: CLARITIN TAKE 1 TABLET(10 MG) BY MOUTH DAILY AS NEEDED FOR ALLERGIES   magnesium oxide 400 (240 Mg) MG tablet Commonly known as: MAG-OX TAKE 1 TABLET(400 MG) BY MOUTH TWICE DAILY   metFORMIN 500 MG tablet Commonly known as: GLUCOPHAGE Take 1 tablet (500 mg total) by mouth daily with breakfast.   naloxone 4 MG/0.1ML Liqd nasal spray kit Commonly known as: NARCAN Place 4 mg into the nose once as needed (accidental overdose).   nitroGLYCERIN 0.4 MG SL tablet Commonly known as: NITROSTAT PLACE 1 TABLET UNDER THE TONGUE EVERY 5 MINUTES AS NEEDED FOR CHEST PAIN. CALL 911 AT THIRD DOSE IN 15 MINUTES   OneTouch Ultra test strip Generic drug: glucose blood Use as instructed to monitor glucose twice daily.   oxyCODONE-acetaminophen 10-325 MG tablet Commonly known as: PERCOCET Take 1 tablet by mouth every 8 (eight) hours as needed for pain.   pantoprazole 40 MG tablet Commonly known as: PROTONIX TAKE 1 TABLET(40 MG) BY MOUTH DAILY   potassium chloride SA 20 MEQ tablet Commonly known as: KLOR-CON M Take 1 tablet (20 mEq total) by mouth daily.   rosuvastatin 40 MG tablet Commonly known as: CRESTOR Take 40 mg by mouth every evening.   silodosin 8 MG Caps capsule Commonly  known as: RAPAFLO TAKE 1 CAPSULE(8 MG) BY MOUTH TWICE DAILY   solifenacin 10 MG tablet Commonly known as: VESICARE Take 1 tablet (10 mg total) by mouth daily.  sotalol 80 MG tablet Commonly known as: BETAPACE Take 1 tablet (80 mg total) by mouth 2 (two) times daily.   Vitamin B 12 500 MCG Tabs Take 500 mcg by mouth in the morning.   Vitamin D (Ergocalciferol) 1.25 MG (50000 UNIT) Caps capsule Commonly known as: DRISDOL TAKE 1 CAPSULE BY MOUTH EVERY 7 DAYS        Allergies:  Allergies  Allergen Reactions   Trazodone And Nefazodone     Nightmares   Lactose Intolerance (Gi) Other (See Comments)    UPSET STOMACH     Family History  Problem Relation Age of Onset   Hypertension Mother        Cerebrovascular disease   Diabetes Mother    Coronary artery disease Father 75   Diabetes type II Father    Hypertension Father    Heart attack Father    Diabetes Brother    Hypertension Brother    Diabetes Sister    Hypertension Sister    Heart attack Sister 73   Leukemia Sister 69       CML   Breast cancer Maternal Grandmother        dx 41s   Lung cancer Maternal Uncle        dx >50, smoker   Cancer Cousin        mouth cancer, dx 59s, no smoking/chew tobacco hx (maternal 1st cousin)   Ovarian cancer Cousin        dx <50 (maternal 1st cousin)   Stomach cancer Cousin        dx 71s (maternal 1st cousin)   Cancer Cousin        type unknown to pt, dx >50 (paternal 1st cousin)    Social History:  reports that he quit smoking about 9 years ago. His smoking use included cigarettes. He started smoking about 49 years ago. He has a 40.00 pack-year smoking history. He has never used smokeless tobacco. He reports current alcohol use. He reports that he does not use drugs.  ROS: A complete review of systems was performed.  All systems are negative except for pertinent findings as noted.  Physical Exam:  Vital signs in last 24 hours: There were no vitals taken for this  visit. Constitutional:  Alert and oriented, No acute distress Cardiovascular: Regular rate  Respiratory: Normal respiratory effort GI: Abdomen is soft, nontender, nondistended, no abdominal masses. No CVAT.  Genitourinary: Normal male phallus, testes are descended bilaterally and non-tender and without masses, scrotum is normal in appearance without lesions or masses, perineum is normal on inspection. Lymphatic: No lymphadenopathy Neurologic: Grossly intact, no focal deficits Psychiatric: Normal mood and affect  I have reviewed prior pt notes  I have reviewed notes from referring/previous physicians  I have reviewed urinalysis results  I have independently reviewed prior imaging  I have reviewed prior PSA results  I have reviewed prior urine culture   Impression/Assessment:  ***  Plan:  ***

## 2022-04-25 ENCOUNTER — Other Ambulatory Visit (HOSPITAL_COMMUNITY): Payer: Self-pay

## 2022-04-25 ENCOUNTER — Encounter: Payer: Self-pay | Admitting: Urology

## 2022-04-25 ENCOUNTER — Ambulatory Visit (INDEPENDENT_AMBULATORY_CARE_PROVIDER_SITE_OTHER): Payer: Medicare Other | Admitting: Urology

## 2022-04-25 VITALS — BP 124/82 | HR 93 | Ht 72.0 in | Wt 293.0 lb

## 2022-04-25 DIAGNOSIS — Z191 Hormone sensitive malignancy status: Secondary | ICD-10-CM

## 2022-04-25 DIAGNOSIS — R350 Frequency of micturition: Secondary | ICD-10-CM

## 2022-04-25 DIAGNOSIS — N401 Enlarged prostate with lower urinary tract symptoms: Secondary | ICD-10-CM | POA: Diagnosis not present

## 2022-04-25 DIAGNOSIS — C61 Malignant neoplasm of prostate: Secondary | ICD-10-CM

## 2022-04-25 DIAGNOSIS — Z192 Hormone resistant malignancy status: Secondary | ICD-10-CM

## 2022-04-26 LAB — URINALYSIS, ROUTINE W REFLEX MICROSCOPIC
Bilirubin, UA: NEGATIVE
Ketones, UA: NEGATIVE
Nitrite, UA: NEGATIVE
Specific Gravity, UA: 1.015 (ref 1.005–1.030)
Urobilinogen, Ur: 1 mg/dL (ref 0.2–1.0)
pH, UA: 7 (ref 5.0–7.5)

## 2022-04-26 LAB — MICROSCOPIC EXAMINATION: Bacteria, UA: NONE SEEN

## 2022-04-28 ENCOUNTER — Other Ambulatory Visit: Payer: Self-pay | Admitting: Urology

## 2022-04-28 ENCOUNTER — Other Ambulatory Visit (HOSPITAL_COMMUNITY): Payer: Self-pay

## 2022-04-28 DIAGNOSIS — R350 Frequency of micturition: Secondary | ICD-10-CM

## 2022-05-02 ENCOUNTER — Other Ambulatory Visit (HOSPITAL_COMMUNITY): Payer: Self-pay

## 2022-05-02 ENCOUNTER — Inpatient Hospital Stay: Payer: Medicare Other | Attending: Hematology

## 2022-05-02 VITALS — BP 109/63 | HR 80 | Temp 97.8°F | Resp 18

## 2022-05-02 DIAGNOSIS — M8580 Other specified disorders of bone density and structure, unspecified site: Secondary | ICD-10-CM | POA: Insufficient documentation

## 2022-05-02 DIAGNOSIS — E669 Obesity, unspecified: Secondary | ICD-10-CM | POA: Diagnosis not present

## 2022-05-02 DIAGNOSIS — Z5111 Encounter for antineoplastic chemotherapy: Secondary | ICD-10-CM | POA: Diagnosis present

## 2022-05-02 DIAGNOSIS — Z809 Family history of malignant neoplasm, unspecified: Secondary | ICD-10-CM | POA: Diagnosis not present

## 2022-05-02 DIAGNOSIS — K5900 Constipation, unspecified: Secondary | ICD-10-CM | POA: Insufficient documentation

## 2022-05-02 DIAGNOSIS — Z806 Family history of leukemia: Secondary | ICD-10-CM | POA: Insufficient documentation

## 2022-05-02 DIAGNOSIS — Z8041 Family history of malignant neoplasm of ovary: Secondary | ICD-10-CM | POA: Insufficient documentation

## 2022-05-02 DIAGNOSIS — C778 Secondary and unspecified malignant neoplasm of lymph nodes of multiple regions: Secondary | ICD-10-CM | POA: Diagnosis not present

## 2022-05-02 DIAGNOSIS — D696 Thrombocytopenia, unspecified: Secondary | ICD-10-CM | POA: Insufficient documentation

## 2022-05-02 DIAGNOSIS — Z803 Family history of malignant neoplasm of breast: Secondary | ICD-10-CM | POA: Diagnosis not present

## 2022-05-02 DIAGNOSIS — E039 Hypothyroidism, unspecified: Secondary | ICD-10-CM | POA: Diagnosis not present

## 2022-05-02 DIAGNOSIS — R0602 Shortness of breath: Secondary | ICD-10-CM | POA: Insufficient documentation

## 2022-05-02 DIAGNOSIS — C61 Malignant neoplasm of prostate: Secondary | ICD-10-CM | POA: Diagnosis present

## 2022-05-02 DIAGNOSIS — Z833 Family history of diabetes mellitus: Secondary | ICD-10-CM | POA: Diagnosis not present

## 2022-05-02 DIAGNOSIS — Z808 Family history of malignant neoplasm of other organs or systems: Secondary | ICD-10-CM | POA: Insufficient documentation

## 2022-05-02 DIAGNOSIS — D649 Anemia, unspecified: Secondary | ICD-10-CM | POA: Insufficient documentation

## 2022-05-02 DIAGNOSIS — Z8249 Family history of ischemic heart disease and other diseases of the circulatory system: Secondary | ICD-10-CM | POA: Insufficient documentation

## 2022-05-02 DIAGNOSIS — Z79899 Other long term (current) drug therapy: Secondary | ICD-10-CM | POA: Diagnosis not present

## 2022-05-02 DIAGNOSIS — Z87891 Personal history of nicotine dependence: Secondary | ICD-10-CM | POA: Insufficient documentation

## 2022-05-02 DIAGNOSIS — Z801 Family history of malignant neoplasm of trachea, bronchus and lung: Secondary | ICD-10-CM | POA: Diagnosis not present

## 2022-05-02 DIAGNOSIS — R39198 Other difficulties with micturition: Secondary | ICD-10-CM | POA: Insufficient documentation

## 2022-05-02 DIAGNOSIS — Z191 Hormone sensitive malignancy status: Secondary | ICD-10-CM

## 2022-05-02 MED ORDER — DEGARELIX ACETATE 80 MG ~~LOC~~ SOLR
80.0000 mg | Freq: Once | SUBCUTANEOUS | Status: AC
  Administered 2022-05-02: 80 mg via SUBCUTANEOUS
  Filled 2022-05-02: qty 4

## 2022-05-02 NOTE — Patient Instructions (Signed)
[facility]  Discharge Instructions: Thank you for choosing [facility] to provide your oncology and hematology care.  If you have a lab appointment with the [facility], please come in thru the Main Entrance and check in at the main information desk.  Wear comfortable clothing and clothing appropriate for easy access to any Portacath or PICC line.   We strive to give you quality time with your provider. You may need to reschedule your appointment if you arrive late (15 or more minutes).  Arriving late affects you and other patients whose appointments are after yours.  Also, if you miss three or more appointments without notifying the office, you may be dismissed from the clinic at the provider's discretion.      For prescription refill requests, have your pharmacy contact our office and allow 72 hours for refills to be completed.    Today you received the following chemotherapy and/or immunotherapy agents Firmagon.  Degarelix Injection What is this medication? DEGARELIX (deg a REL ix) treats prostate cancer. It works by decreasing levels of the hormone testosterone in the body. This prevents prostate cancer cells from spreading or growing. It belongs to a group of medications called GnRH blockers. This medicine may be used for other purposes; ask your health care provider or pharmacist if you have questions. COMMON BRAND NAME(S): Degarelix, [place] What should I tell my care team before I take this medication? They need to know if you have any of these conditions: Diabetes Heart disease Kidney disease Liver disease Low levels of potassium or magnesium in the blood Osteoporosis, weak bones An unusual or allergic reaction to degarelix, mannitol, other medications, foods, dyes, or preservatives If you or your partner are pregnant or trying to get pregnant How should I use this medication? This medication is injected under the skin. It is usually given  by your care team in a hospital or clinic setting. It may also be given at home. If you get this medication at home, you will be taught how to prepare and give it. Use exactly as directed. Take it as directed on the prescription label at the same time every day. Keep taking it unless your care team tells you to stop. It is important that you put your used needles and syringes in a special sharps container. Do not put them in a trash can. If you do not have a sharps container, call your pharmacist or care team to get one. Talk to your care team about the use of this medication in children. Special care may be needed. Overdosage: If you think you have taken too much of this medicine contact a poison control center or emergency room at once. NOTE: This medicine is only for you. Do not share this medicine with others. What if I miss a dose? If you get this medication at the hospital or clinic: It is important not to miss your dose. Call your care team if you are unable to keep an appointment. If you give yourself this medication at home: If you miss a dose, take it as soon as you can. If it is almost time for your next dose, take only that dose. Do not take double or extra doses. Call your care team with questions. What may interact with this medication? Do not take this medication with any of the following: Cisapride Dronedarone Pimozide Thioridazine This medication may also interact with the following: Other medications that cause heart rhythm changes This list may  not describe all possible interactions. Give your health care provider a list of all the medicines, herbs, non-prescription drugs, or dietary supplements you use. Also tell them if you smoke, drink alcohol, or use illegal drugs. Some items may interact with your medicine. What should I watch for while using this medication? Your condition will be monitored carefully while you are receiving this medication. You may need blood work while  taking this medication. Do not rub or scratch injection site. There may be a lump at the injection site, or it may be red or sore for a few days after your dose. This medication may cause infertility. Talk to your care team if you are concerned about your fertility. What side effects may I notice from receiving this medication? Side effects that you should report to your care team as soon as possible: Allergic reactions or angioedema--skin rash, itching or hives, swelling of the face, eyes, lips, tongue, arms, or legs, trouble swallowing or breathing Heart rhythm changes--fast or irregular heartbeat, dizziness, feeling faint or lightheaded, chest pain, trouble breathing Side effects that usually do not require medical attention (report to your care team if they continue or are bothersome): Hot flashes Pain, redness, or irritation at injection site Weight gain This list may not describe all possible side effects. Call your doctor for medical advice about side effects. You may report side effects to FDA at 1-800-FDA-1088. Where should I keep my medication? Keep out of the reach of children and pets. This medication is usually given in a hospital or clinic and will not be stored at home. In rare cases, this medication may be given at home. If you are using this medication at home, you will be instructed on how to store this medication. Get rid of any unused medication after the expiration date. To get rid of medications that are no longer needed or have expired: Take the medication to a medication take-back program. Check with your pharmacy or law enforcement to find a location. If you cannot return the medication, ask your pharmacist or care team how to get rid of this medication safely. NOTE: This sheet is a summary. It may not cover all possible information. If you have questions about this medicine, talk to your doctor, pharmacist, or health care provider.  2023 Elsevier/Gold Standard  (2021-10-05 00:00:00)        To help prevent nausea and vomiting after your treatment, we encourage you to take your nausea medication as directed.  BELOW ARE SYMPTOMS THAT SHOULD BE REPORTED IMMEDIATELY: *FEVER GREATER THAN 100.4 F (38 C) OR HIGHER *CHILLS OR SWEATING *NAUSEA AND VOMITING THAT IS NOT CONTROLLED WITH YOUR NAUSEA MEDICATION *UNUSUAL SHORTNESS OF BREATH *UNUSUAL BRUISING OR BLEEDING *URINARY PROBLEMS (pain or burning when urinating, or frequent urination) *BOWEL PROBLEMS (unusual diarrhea, constipation, pain near the anus) TENDERNESS IN MOUTH AND THROAT WITH OR WITHOUT PRESENCE OF ULCERS (sore throat, sores in mouth, or a toothache) UNUSUAL RASH, SWELLING OR PAIN  UNUSUAL VAGINAL DISCHARGE OR ITCHING   Items with * indicate a potential emergency and should be followed up as soon as possible or go to the Emergency Department if any problems should occur.  Please show the CHEMOTHERAPY ALERT CARD or IMMUNOTHERAPY ALERT CARD at check-in to the Emergency Department and triage nurse.  Should you have questions after your visit or need to cancel or reschedule your appointment, please contact [facility] [phone]  and follow the prompts.  Office hours are 8:00 a.m. to 4:30 p.m.  Monday - Friday. Please note that voicemails left after 4:00 p.m. may not be returned until the following business day.  We are closed weekends and major holidays. You have access to a nurse at all times for urgent questions. Please call the main number to the clinic [phone] and follow the prompts.  For any non-urgent questions, you may also contact your provider using MyChart. We now offer e-Visits for anyone 58 and older to request care online for non-urgent symptoms. For details visit [email or web address].   Also download the MyChart app! Go to the app store, search "MyChart", open the app, select [facility], and log in with your MyChart username and password.  Masks are  optional in the cancer centers. If you would like for your care team to wear a mask while they are taking care of you, please let them know. You may have one support person who is at least 66 years old accompany you for your appointments.

## 2022-05-02 NOTE — Progress Notes (Signed)
Patient tolerated Firmagon injection with no complaints voiced.  Site clean and dry with no bruising or swelling noted.  No complaints of pain.  Discharged with vital signs stable and no signs or symptoms of distress noted.

## 2022-05-04 ENCOUNTER — Other Ambulatory Visit: Payer: Self-pay | Admitting: Urology

## 2022-05-10 ENCOUNTER — Ambulatory Visit (INDEPENDENT_AMBULATORY_CARE_PROVIDER_SITE_OTHER): Payer: Medicare Other

## 2022-05-10 DIAGNOSIS — I495 Sick sinus syndrome: Secondary | ICD-10-CM | POA: Diagnosis not present

## 2022-05-10 LAB — CUP PACEART REMOTE DEVICE CHECK
Battery Remaining Longevity: 43 mo
Battery Remaining Percentage: 35 %
Battery Voltage: 2.95 V
Brady Statistic AP VP Percent: 1 %
Brady Statistic AP VS Percent: 2.6 %
Brady Statistic AS VP Percent: 3.2 %
Brady Statistic AS VS Percent: 94 %
Brady Statistic RA Percent Paced: 1.6 %
Brady Statistic RV Percent Paced: 3.2 %
Date Time Interrogation Session: 20231206020015
Implantable Lead Connection Status: 753985
Implantable Lead Connection Status: 753985
Implantable Lead Implant Date: 20160415
Implantable Lead Implant Date: 20160415
Implantable Lead Location: 753859
Implantable Lead Location: 753860
Implantable Pulse Generator Implant Date: 20160415
Lead Channel Impedance Value: 430 Ohm
Lead Channel Impedance Value: 580 Ohm
Lead Channel Pacing Threshold Amplitude: 0.75 V
Lead Channel Pacing Threshold Amplitude: 0.75 V
Lead Channel Pacing Threshold Pulse Width: 0.5 ms
Lead Channel Pacing Threshold Pulse Width: 0.5 ms
Lead Channel Sensing Intrinsic Amplitude: 1.7 mV
Lead Channel Sensing Intrinsic Amplitude: 12 mV
Lead Channel Setting Pacing Amplitude: 2 V
Lead Channel Setting Pacing Amplitude: 2.5 V
Lead Channel Setting Pacing Pulse Width: 0.5 ms
Lead Channel Setting Sensing Sensitivity: 2 mV
Pulse Gen Model: 2240
Pulse Gen Serial Number: 7756161

## 2022-05-23 ENCOUNTER — Other Ambulatory Visit: Payer: Self-pay

## 2022-05-23 ENCOUNTER — Inpatient Hospital Stay: Payer: Medicare Other | Attending: Hematology

## 2022-05-23 DIAGNOSIS — Z800 Family history of malignant neoplasm of digestive organs: Secondary | ICD-10-CM | POA: Insufficient documentation

## 2022-05-23 DIAGNOSIS — C61 Malignant neoplasm of prostate: Secondary | ICD-10-CM | POA: Insufficient documentation

## 2022-05-23 DIAGNOSIS — Z8041 Family history of malignant neoplasm of ovary: Secondary | ICD-10-CM | POA: Insufficient documentation

## 2022-05-23 DIAGNOSIS — C778 Secondary and unspecified malignant neoplasm of lymph nodes of multiple regions: Secondary | ICD-10-CM | POA: Insufficient documentation

## 2022-05-23 DIAGNOSIS — Z87891 Personal history of nicotine dependence: Secondary | ICD-10-CM | POA: Insufficient documentation

## 2022-05-23 DIAGNOSIS — G8929 Other chronic pain: Secondary | ICD-10-CM | POA: Insufficient documentation

## 2022-05-23 DIAGNOSIS — K5900 Constipation, unspecified: Secondary | ICD-10-CM | POA: Insufficient documentation

## 2022-05-23 DIAGNOSIS — Z806 Family history of leukemia: Secondary | ICD-10-CM | POA: Insufficient documentation

## 2022-05-23 DIAGNOSIS — Z833 Family history of diabetes mellitus: Secondary | ICD-10-CM | POA: Insufficient documentation

## 2022-05-23 DIAGNOSIS — R197 Diarrhea, unspecified: Secondary | ICD-10-CM | POA: Insufficient documentation

## 2022-05-23 DIAGNOSIS — E785 Hyperlipidemia, unspecified: Secondary | ICD-10-CM | POA: Insufficient documentation

## 2022-05-23 DIAGNOSIS — Z803 Family history of malignant neoplasm of breast: Secondary | ICD-10-CM | POA: Insufficient documentation

## 2022-05-23 DIAGNOSIS — Z5111 Encounter for antineoplastic chemotherapy: Secondary | ICD-10-CM | POA: Insufficient documentation

## 2022-05-23 DIAGNOSIS — Z8249 Family history of ischemic heart disease and other diseases of the circulatory system: Secondary | ICD-10-CM | POA: Insufficient documentation

## 2022-05-23 DIAGNOSIS — M8580 Other specified disorders of bone density and structure, unspecified site: Secondary | ICD-10-CM | POA: Insufficient documentation

## 2022-05-23 DIAGNOSIS — Z808 Family history of malignant neoplasm of other organs or systems: Secondary | ICD-10-CM | POA: Insufficient documentation

## 2022-05-23 DIAGNOSIS — Z79899 Other long term (current) drug therapy: Secondary | ICD-10-CM | POA: Insufficient documentation

## 2022-05-23 DIAGNOSIS — Z801 Family history of malignant neoplasm of trachea, bronchus and lung: Secondary | ICD-10-CM | POA: Insufficient documentation

## 2022-05-23 DIAGNOSIS — K7460 Unspecified cirrhosis of liver: Secondary | ICD-10-CM | POA: Insufficient documentation

## 2022-05-23 DIAGNOSIS — Z809 Family history of malignant neoplasm, unspecified: Secondary | ICD-10-CM | POA: Insufficient documentation

## 2022-05-23 DIAGNOSIS — D649 Anemia, unspecified: Secondary | ICD-10-CM | POA: Insufficient documentation

## 2022-05-23 DIAGNOSIS — D696 Thrombocytopenia, unspecified: Secondary | ICD-10-CM | POA: Insufficient documentation

## 2022-05-23 DIAGNOSIS — M549 Dorsalgia, unspecified: Secondary | ICD-10-CM | POA: Insufficient documentation

## 2022-05-25 ENCOUNTER — Inpatient Hospital Stay: Payer: Medicare Other

## 2022-05-25 DIAGNOSIS — D696 Thrombocytopenia, unspecified: Secondary | ICD-10-CM | POA: Diagnosis not present

## 2022-05-25 DIAGNOSIS — M549 Dorsalgia, unspecified: Secondary | ICD-10-CM | POA: Diagnosis not present

## 2022-05-25 DIAGNOSIS — M8580 Other specified disorders of bone density and structure, unspecified site: Secondary | ICD-10-CM | POA: Diagnosis not present

## 2022-05-25 DIAGNOSIS — Z8249 Family history of ischemic heart disease and other diseases of the circulatory system: Secondary | ICD-10-CM | POA: Diagnosis not present

## 2022-05-25 DIAGNOSIS — Z191 Hormone sensitive malignancy status: Secondary | ICD-10-CM

## 2022-05-25 DIAGNOSIS — Z801 Family history of malignant neoplasm of trachea, bronchus and lung: Secondary | ICD-10-CM | POA: Diagnosis not present

## 2022-05-25 DIAGNOSIS — E785 Hyperlipidemia, unspecified: Secondary | ICD-10-CM | POA: Diagnosis not present

## 2022-05-25 DIAGNOSIS — C778 Secondary and unspecified malignant neoplasm of lymph nodes of multiple regions: Secondary | ICD-10-CM | POA: Diagnosis not present

## 2022-05-25 DIAGNOSIS — K5900 Constipation, unspecified: Secondary | ICD-10-CM | POA: Diagnosis not present

## 2022-05-25 DIAGNOSIS — R197 Diarrhea, unspecified: Secondary | ICD-10-CM | POA: Diagnosis not present

## 2022-05-25 DIAGNOSIS — C61 Malignant neoplasm of prostate: Secondary | ICD-10-CM | POA: Diagnosis present

## 2022-05-25 DIAGNOSIS — Z808 Family history of malignant neoplasm of other organs or systems: Secondary | ICD-10-CM | POA: Diagnosis not present

## 2022-05-25 DIAGNOSIS — K7460 Unspecified cirrhosis of liver: Secondary | ICD-10-CM | POA: Diagnosis not present

## 2022-05-25 DIAGNOSIS — Z833 Family history of diabetes mellitus: Secondary | ICD-10-CM | POA: Diagnosis not present

## 2022-05-25 DIAGNOSIS — Z809 Family history of malignant neoplasm, unspecified: Secondary | ICD-10-CM | POA: Diagnosis not present

## 2022-05-25 DIAGNOSIS — D649 Anemia, unspecified: Secondary | ICD-10-CM

## 2022-05-25 DIAGNOSIS — Z803 Family history of malignant neoplasm of breast: Secondary | ICD-10-CM | POA: Diagnosis not present

## 2022-05-25 DIAGNOSIS — Z806 Family history of leukemia: Secondary | ICD-10-CM | POA: Diagnosis not present

## 2022-05-25 DIAGNOSIS — G8929 Other chronic pain: Secondary | ICD-10-CM | POA: Diagnosis not present

## 2022-05-25 DIAGNOSIS — Z800 Family history of malignant neoplasm of digestive organs: Secondary | ICD-10-CM | POA: Diagnosis not present

## 2022-05-25 DIAGNOSIS — Z87891 Personal history of nicotine dependence: Secondary | ICD-10-CM | POA: Diagnosis not present

## 2022-05-25 DIAGNOSIS — Z79899 Other long term (current) drug therapy: Secondary | ICD-10-CM | POA: Diagnosis not present

## 2022-05-25 DIAGNOSIS — Z8041 Family history of malignant neoplasm of ovary: Secondary | ICD-10-CM | POA: Diagnosis not present

## 2022-05-25 DIAGNOSIS — Z5111 Encounter for antineoplastic chemotherapy: Secondary | ICD-10-CM | POA: Diagnosis not present

## 2022-05-25 LAB — COMPREHENSIVE METABOLIC PANEL
ALT: 8 U/L (ref 0–44)
AST: 21 U/L (ref 15–41)
Albumin: 3.7 g/dL (ref 3.5–5.0)
Alkaline Phosphatase: 77 U/L (ref 38–126)
Anion gap: 6 (ref 5–15)
BUN: 13 mg/dL (ref 8–23)
CO2: 21 mmol/L — ABNORMAL LOW (ref 22–32)
Calcium: 9 mg/dL (ref 8.9–10.3)
Chloride: 111 mmol/L (ref 98–111)
Creatinine, Ser: 1.18 mg/dL (ref 0.61–1.24)
GFR, Estimated: >60 mL/min (ref >60)
Glucose, Bld: 119 mg/dL — ABNORMAL HIGH (ref 70–99)
Potassium: 3.9 mmol/L (ref 3.5–5.1)
Sodium: 138 mmol/L (ref 135–145)
Total Bilirubin: 0.5 mg/dL (ref 0.3–1.2)
Total Protein: 7.5 g/dL (ref 6.5–8.1)

## 2022-05-25 LAB — CBC WITH DIFFERENTIAL/PLATELET
Abs Immature Granulocytes: 0.01 10*3/uL (ref 0.00–0.07)
Basophils Absolute: 0.1 10*3/uL (ref 0.0–0.1)
Basophils Relative: 1 %
Eosinophils Absolute: 0.1 10*3/uL (ref 0.0–0.5)
Eosinophils Relative: 2 %
HCT: 32.9 % — ABNORMAL LOW (ref 39.0–52.0)
Hemoglobin: 10.1 g/dL — ABNORMAL LOW (ref 13.0–17.0)
Immature Granulocytes: 0 %
Lymphocytes Relative: 26 %
Lymphs Abs: 1.6 10*3/uL (ref 0.7–4.0)
MCH: 27.7 pg (ref 26.0–34.0)
MCHC: 30.7 g/dL (ref 30.0–36.0)
MCV: 90.1 fL (ref 80.0–100.0)
Monocytes Absolute: 0.5 10*3/uL (ref 0.1–1.0)
Monocytes Relative: 9 %
Neutro Abs: 3.9 10*3/uL (ref 1.7–7.7)
Neutrophils Relative %: 62 %
Platelets: 136 10*3/uL — ABNORMAL LOW (ref 150–400)
RBC: 3.65 MIL/uL — ABNORMAL LOW (ref 4.22–5.81)
RDW: 16.6 % — ABNORMAL HIGH (ref 11.5–15.5)
WBC: 6.2 10*3/uL (ref 4.0–10.5)
nRBC: 0 % (ref 0.0–0.2)

## 2022-05-25 LAB — PSA: Prostatic Specific Antigen: <0.01 ng/mL (ref 0.00–4.00)

## 2022-05-25 LAB — MAGNESIUM: Magnesium: 2 mg/dL (ref 1.7–2.4)

## 2022-05-30 ENCOUNTER — Inpatient Hospital Stay: Payer: Medicare Other

## 2022-05-30 ENCOUNTER — Other Ambulatory Visit: Payer: Self-pay

## 2022-05-30 ENCOUNTER — Inpatient Hospital Stay (HOSPITAL_BASED_OUTPATIENT_CLINIC_OR_DEPARTMENT_OTHER): Payer: Medicare Other | Admitting: Hematology

## 2022-05-30 VITALS — BP 113/67 | HR 70 | Temp 97.7°F | Resp 18 | Ht 72.0 in | Wt 291.8 lb

## 2022-05-30 DIAGNOSIS — Z191 Hormone sensitive malignancy status: Secondary | ICD-10-CM

## 2022-05-30 DIAGNOSIS — D649 Anemia, unspecified: Secondary | ICD-10-CM | POA: Diagnosis not present

## 2022-05-30 DIAGNOSIS — C61 Malignant neoplasm of prostate: Secondary | ICD-10-CM | POA: Diagnosis not present

## 2022-05-30 DIAGNOSIS — Z5111 Encounter for antineoplastic chemotherapy: Secondary | ICD-10-CM | POA: Diagnosis not present

## 2022-05-30 MED ORDER — DEGARELIX ACETATE 80 MG ~~LOC~~ SOLR
80.0000 mg | Freq: Once | SUBCUTANEOUS | Status: AC
  Administered 2022-05-30: 80 mg via SUBCUTANEOUS
  Filled 2022-05-30: qty 4

## 2022-05-30 NOTE — Patient Instructions (Signed)
[facility]  Discharge Instructions: Thank you for choosing [facility] to provide your oncology and hematology care.  If you have a lab appointment with the [facility], please come in thru the Main Entrance and check in at the main information desk.  Wear comfortable clothing and clothing appropriate for easy access to any Portacath or PICC line.   We strive to give you quality time with your provider. You may need to reschedule your appointment if you arrive late (15 or more minutes).  Arriving late affects you and other patients whose appointments are after yours.  Also, if you miss three or more appointments without notifying the office, you may be dismissed from the clinic at the provider's discretion.      For prescription refill requests, have your pharmacy contact our office and allow 72 hours for refills to be completed.    Today you received the following chemotherapy and/or immunotherapy agents firmagon.  Degarelix Injection What is this medication? DEGARELIX (deg a REL ix) treats prostate cancer. It works by decreasing levels of the hormone testosterone in the body. This prevents prostate cancer cells from spreading or growing. It belongs to a group of medications called GnRH blockers. This medicine may be used for other purposes; ask your health care provider or pharmacist if you have questions. COMMON BRAND NAME(S): Degarelix, Mills Koller What should I tell my care team before I take this medication? They need to know if you have any of these conditions: Diabetes Heart disease Kidney disease Liver disease Low levels of potassium or magnesium in the blood Osteoporosis, weak bones An unusual or allergic reaction to degarelix, mannitol, other medications, foods, dyes, or preservatives If you or your partner are pregnant or trying to get pregnant How should I use this medication? This medication is injected under the skin. It is usually given  by your care team in a hospital or clinic setting. It may also be given at home. If you get this medication at home, you will be taught how to prepare and give it. Use exactly as directed. Take it as directed on the prescription label at the same time every day. Keep taking it unless your care team tells you to stop. It is important that you put your used needles and syringes in a special sharps container. Do not put them in a trash can. If you do not have a sharps container, call your pharmacist or care team to get one. Talk to your care team about the use of this medication in children. Special care may be needed. Overdosage: If you think you have taken too much of this medicine contact a poison control center or emergency room at once. NOTE: This medicine is only for you. Do not share this medicine with others. What if I miss a dose? If you get this medication at the hospital or clinic: It is important not to miss your dose. Call your care team if you are unable to keep an appointment. If you give yourself this medication at home: If you miss a dose, take it as soon as you can. If it is almost time for your next dose, take only that dose. Do not take double or extra doses. Call your care team with questions. What may interact with this medication? Do not take this medication with any of the following: Cisapride Dronedarone Pimozide Thioridazine This medication may also interact with the following: Other medications that cause heart rhythm changes This list may  not describe all possible interactions. Give your health care provider a list of all the medicines, herbs, non-prescription drugs, or dietary supplements you use. Also tell them if you smoke, drink alcohol, or use illegal drugs. Some items may interact with your medicine. What should I watch for while using this medication? Your condition will be monitored carefully while you are receiving this medication. You may need blood work while  taking this medication. Do not rub or scratch injection site. There may be a lump at the injection site, or it may be red or sore for a few days after your dose. This medication may cause infertility. Talk to your care team if you are concerned about your fertility. What side effects may I notice from receiving this medication? Side effects that you should report to your care team as soon as possible: Allergic reactions or angioedema--skin rash, itching or hives, swelling of the face, eyes, lips, tongue, arms, or legs, trouble swallowing or breathing Heart rhythm changes--fast or irregular heartbeat, dizziness, feeling faint or lightheaded, chest pain, trouble breathing Side effects that usually do not require medical attention (report to your care team if they continue or are bothersome): Hot flashes Pain, redness, or irritation at injection site Weight gain This list may not describe all possible side effects. Call your doctor for medical advice about side effects. You may report side effects to FDA at 1-800-FDA-1088. Where should I keep my medication? Keep out of the reach of children and pets. This medication is usually given in a hospital or clinic and will not be stored at home. In rare cases, this medication may be given at home. If you are using this medication at home, you will be instructed on how to store this medication. Get rid of any unused medication after the expiration date. To get rid of medications that are no longer needed or have expired: Take the medication to a medication take-back program. Check with your pharmacy or law enforcement to find a location. If you cannot return the medication, ask your pharmacist or care team how to get rid of this medication safely. NOTE: This sheet is a summary. It may not cover all possible information. If you have questions about this medicine, talk to your doctor, pharmacist, or health care provider.  2023 Elsevier/Gold Standard  (2021-10-05 00:00:00)       To help prevent nausea and vomiting after your treatment, we encourage you to take your nausea medication as directed.  BELOW ARE SYMPTOMS THAT SHOULD BE REPORTED IMMEDIATELY: *FEVER GREATER THAN 100.4 F (38 C) OR HIGHER *CHILLS OR SWEATING *NAUSEA AND VOMITING THAT IS NOT CONTROLLED WITH YOUR NAUSEA MEDICATION *UNUSUAL SHORTNESS OF BREATH *UNUSUAL BRUISING OR BLEEDING *URINARY PROBLEMS (pain or burning when urinating, or frequent urination) *BOWEL PROBLEMS (unusual diarrhea, constipation, pain near the anus) TENDERNESS IN MOUTH AND THROAT WITH OR WITHOUT PRESENCE OF ULCERS (sore throat, sores in mouth, or a toothache) UNUSUAL RASH, SWELLING OR PAIN  UNUSUAL VAGINAL DISCHARGE OR ITCHING   Items with * indicate a potential emergency and should be followed up as soon as possible or go to the Emergency Department if any problems should occur.  Please show the CHEMOTHERAPY ALERT CARD or IMMUNOTHERAPY ALERT CARD at check-in to the Emergency Department and triage nurse.  Should you have questions after your visit or need to cancel or reschedule your appointment, please contact [facility] [phone]  and follow the prompts.  Office hours are 8:00 a.m. to 4:30 p.m. Monday -  Friday. Please note that voicemails left after 4:00 p.m. may not be returned until the following business day.  We are closed weekends and major holidays. You have access to a nurse at all times for urgent questions. Please call the main number to the clinic [phone] and follow the prompts.  For any non-urgent questions, you may also contact your provider using MyChart. We now offer e-Visits for anyone 20 and older to request care online for non-urgent symptoms. For details visit [email or web address].   Also download the MyChart app! Go to the app store, search "MyChart", open the app, select [facility], and log in with your MyChart username and password.  Masks are  optional in the cancer centers. If you would like for your care team to wear a mask while they are taking care of you, please let them know. You may have one support person who is at least 66 years old accompany you for your appointments.

## 2022-05-30 NOTE — Patient Instructions (Addendum)
[facility]  Discharge Instructions  You were seen and examined today by Dr. Delton Coombes.  Dr. Delton Coombes discussed your most recent lab work which revealed that everything looks good except you hemoglobin dropped some. Dr. Delton Coombes recommends you having IV iron to help build your levels and give you energy.   Continue taking Erleada as prescribed and getting your injections monthly. Go see your dentist to have an evaluation and have any dental work that you need in order to get bone injections.  Follow-up as scheduled in 3 months with labs.    Thank you for choosing [facility] to provide your oncology and hematology care.   To afford each patient quality time with our provider, please arrive at least 15 minutes before your scheduled appointment time. You may need to reschedule your appointment if you arrive late (10 or more minutes). Arriving late affects you and other patients whose appointments are after yours.  Also, if you miss three or more appointments without notifying the office, you may be dismissed from the clinic at the provider's discretion.    Again, thank you for choosing [facility].  Our hope is that these requests will decrease the amount of time that you wait before being seen by our physicians.   If you have a lab appointment with the [facility] please come in thru the Main Entrance and check in at the main information desk.           _____________________________________________________________  Should you have questions after your visit to [facility], please contact our office at [phone] and follow the prompts.  Our office hours are 8:00 a.m. to 4:30 p.m. Monday - Thursday and 8:00 a.m. to 2:30 p.m. Friday.  Please note that voicemails left after 4:00 p.m. may not be returned until the following business day.  We are closed weekends and all major holidays.  You do have access to  a nurse 24-7, just call the main number to the clinic [phone] and do not press any options, hold on the line and a nurse will answer the phone.    For prescription refill requests, have your pharmacy contact our office and allow 72 hours.    Masks are optional in the cancer centers. If you would like for your care team to wear a mask while they are taking care of you, please let them know. You may have one support person who is at least 66 years old accompany you for your appointments.

## 2022-05-30 NOTE — Progress Notes (Signed)
Patient has been assessed, vital signs and labs have been reviewed by Dr. Delton Coombes. ANC, Creatinine, LFTs, and Platelets are within treatment parameters per Dr. Delton Coombes. The patient is good to proceed with Degarelix treatment at this time.  Primary RN and pharmacy aware.

## 2022-05-30 NOTE — Progress Notes (Signed)
Patient tolerated Firmagon injection with no complaints voiced.  Patient stable during and after injection.  See MAR for details.  Site clean and dry with no bruising or swelling noted at site.  Band aid applied.  Vss with discharge and left in satisfactory condition with no s/s of distress noted.

## 2022-05-30 NOTE — Progress Notes (Signed)
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 A variant of uncertain significance (VUS) was detected in the NF1 gene called c.1178A>G. The report date is 05/23/2020.  The Common Hereditary Cancers Panel offered by [facility] includes sequencing and/or deletion duplication testing of the following 47 genes: APC, ATM, AXIN2, BARD1, BMPR1A, BRCA1, BRCA2, BRIP1, CDH1, CDK4, CDKN2A (p14ARF), CDKN2A (p16INK4a), CHEK2, CTNNA1, DICER1, EPCAM (Deletion/duplication testing only), GREM1 (promoter region deletion/duplication testing only), KIT, MEN1, MLH1, MSH2, MSH3, MSH6, MUTYH, NBN, NF1, NTHL1, PALB2, PDGFRA, PMS2, POLD1, POLE, PTEN, RAD50, RAD51C, RAD51D, SDHB, SDHC, SDHD, SMAD4, SMARCA4. STK11, TP53, TSC1, TSC2, and VHL.  The following genes were evaluated for sequence changes only: SDHA and HOXB13 c.251G>A variant only. The Prostate Cancer HRR Panel offered by [facility] includes sequencing and/or deletion duplication analysis of the following 10 genes: ATM, BARD1, BRCA1, BRCA2, BRIP1, CHEK2, FANCL, PALB2, RAD51C, RAD51D.     CANCER STAGING:  Cancer Staging  No matching staging information was found for the patient.  INTERVAL HISTORY:  Mr. EVERTON BERTHA, a 66 y.o. male, seen for follow-up of metastatic castration sensitive prostate cancer.  He  is tolerating apalutamide reasonably well.  He denies any falls.  Energy levels are 70%.  Chronic back pain rated 5 out of 10 is stable.  Alternating constipation with diarrhea is also stable.  Denies any major hot flashes.  Denies any recent infections.  REVIEW OF SYSTEMS:  Review of Systems  Constitutional:  Negative for appetite change.  Gastrointestinal:  Positive for constipation and diarrhea.  Musculoskeletal:  Positive for back pain.  All other systems reviewed and are negative.   PAST MEDICAL/SURGICAL HISTORY:  Past Medical History:  Diagnosis Date   Arteriosclerotic cardiovascular disease (ASCVD) 2005   catheterization in 10/2010:50% mid LAD, diffuse distal disease, circumflex irregularities, large dominant RCA with a 50% ostial, 70% distal, 60% posterolateral and 70% PDA; normal EF   Arthritis    Benign prostatic hypertrophy    Bilateral carpal tunnel syndrome 07/03/2018   Cerebrovascular disease 2010   R. carotid endarterectomy; Duplex in 10/2010-widely patent ICAs, subtotal left vertebral-not thought to be contributing to symptoms   Cervical spine disease    CT in 2012-advanced degeneration and spondylosis with moderate spinal stenosis at C3-C6   CHF (congestive heart failure) ([facility])    Depression    Diabetes mellitus without complication ([place])    Erectile dysfunction    Family history of breast cancer    Family history of cancer of mouth    Family history of CML (chronic monocytic leukemia)    Family history of lung cancer    Family history of ovarian cancer    Family history of stomach cancer    Gastroesophageal reflux disease    H/O hiatal hernia    H/O: substance abuse ([place])    Cocaine, marijuana, alcohol.  Quit 2013.    Hyperlipidemia  Hypertension    Non-ST elevation myocardial infarction (NSTEMI), initial episode of care ([place]) 12/02/2013   DES LAD   Obesity    Prostate cancer ([place])    Sleep apnea    CPAP   Tachy-brady syndrome ([place])    a. 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 25, 2010    CEA   COLONOSCOPY WITH PROPOFOL N/A 06/24/2021   Procedure: COLONOSCOPY WITH PROPOFOL;  Surgeon: Carol Ada, MD;  Location: [facility]COPY;  Service: Endoscopy;  Laterality: N/A;   CORONARY ANGIOPLASTY WITH STENT PLACEMENT  12/03/2013   LAD 90%-->0% W/ Promus Premier DES 3.0 mm x 16 mm, CFX OK, RCA 40%, EF 70-75%   LEFT ATRIAL APPENDAGE OCCLUSION N/A 08/05/2015   Procedure: LEFT ATRIAL APPENDAGE OCCLUSION;  Surgeon: [name], MD;  Location: [facility] CV LAB;  Service: Cardiovascular;  Laterality: N/A;   LEFT HEART CATH AND CORONARY ANGIOGRAPHY N/A 09/13/2021   Procedure: LEFT HEART CATH AND CORONARY ANGIOGRAPHY;  Surgeon: Early Osmond, MD;  Location: [facility] CV LAB;  Service: Cardiovascular;  Laterality: N/A;   LEFT HEART CATHETERIZATION WITH CORONARY ANGIOGRAM Left 12/03/2013   Procedure: LEFT HEART CATHETERIZATION WITH CORONARY ANGIOGRAM;  Surgeon: Leonie Man, MD;  Location: [facility] CATH LAB;  Service: Cardiovascular;  Laterality: Left;   LEFT HEART CATHETERIZATION WITH CORONARY ANGIOGRAM N/A 01/26/2014   Procedure: LEFT HEART CATHETERIZATION WITH CORONARY ANGIOGRAM;  Surgeon: Jettie Booze, MD;  Location: [facility] CATH LAB;  Service: Cardiovascular;  Laterality: N/A;   LEFT HEART CATHETERIZATION WITH CORONARY ANGIOGRAM N/A 08/03/2014   Procedure: LEFT HEART CATHETERIZATION WITH CORONARY ANGIOGRAM;  Surgeon: Burnell Blanks, MD;  Location: [facility] CATH LAB;  Service: Cardiovascular;  Laterality: N/A;   PERCUTANEOUS CORONARY STENT INTERVENTION (PCI-S)  12/03/2013   Procedure:  PERCUTANEOUS CORONARY STENT INTERVENTION (PCI-S);  Surgeon: Leonie Man, MD;  Location: [facility] CATH LAB;  Service: Cardiovascular;;   PERMANENT PACEMAKER INSERTION N/A 09/18/2014   Procedure: PERMANENT PACEMAKER INSERTION;  Surgeon: [name], MD;  Location[facility] CATH LAB;  Service: Cardiovascular;  Laterality: N/A;   POLYPECTOMY  06/24/2021   Procedure: POLYPECTOMY;  Surgeon: Carol Ada, MD;  Location: [facility] ENDOSCOPY;  Service: Endoscopy;;   RADIOFREQUENCY ABLATION  2005   for PSVT   TEE WITHOUT CARDIOVERSION N/A 07/27/2015   Procedure: TRANSESOPHAGEAL ECHOCARDIOGRAM (TEE);  Surgeon: Lelon Perla, MD;  Location: [facility] ENDOSCOPY;  Service: Cardiovascular;  Laterality: N/A;   TEE WITHOUT CARDIOVERSION N/A 09/15/2015   Procedure: TRANSESOPHAGEAL ECHOCARDIOGRAM (TEE);  Surgeon: Thayer Headings, MD;  Location: [facility] ENDOSCOPY;  Service: Cardiovascular;  Laterality: N/A;    SOCIAL HISTORY:  Social History   Socioeconomic History   Marital status: Married    Spouse name: Not on file   Number of children: 0   Years of education: Not on file   Highest education level: Not on file  Occupational History   Occupation: Retired  Tobacco Use   Smoking status: Former    Packs/day: 1.00    Years: 40.00    Total pack years: 40.00  Types: Cigarettes    Start date: 10/20/1972    Quit date: 10/10/2012    Years since quitting: 9.6   Smokeless tobacco: Never   Tobacco comments:    Quit in May.   Vaping Use   Vaping Use: Never used  Substance and Sexual Activity   Alcohol use: Yes    Alcohol/week: 0.0 standard drinks of alcohol    Comment: former drinker-- sober since 2013.    Drug use: No    Types: Cocaine    Comment: quit cocaine 10/2011   Sexual activity: Yes    Partners: Female  Other Topics Concern   Not on file  Social History Narrative   Lives in [place].   Social Determinants of Health   Financial Resource Strain: Low Risk  (04/19/2020)   Overall Financial Resource Strain  (CARDIA)    Difficulty of Paying Living Expenses: Not hard at all  Food Insecurity: No Food Insecurity (04/19/2020)   Hunger Vital Sign    Worried About Running Out of Food in the Last Year: Never true    Ran Out of Food in the Last Year: Never true  Transportation Needs: No Transportation Needs (04/19/2020)   PRAPARE - Hydrologist (Medical): No    Lack of Transportation (Non-Medical): No  Physical Activity: Inactive (04/19/2020)   Exercise Vital Sign    Days of Exercise per Week: 0 days    Minutes of Exercise per Session: 0 min  Stress: No Stress Concern Present (04/19/2020)   [place]    Feeling of Stress : Not at all  Social Connections: Moderately Isolated (04/19/2020)   Social Connection and Isolation Panel [NHANES]    Frequency of Communication with Friends and Family: More than three times a week    Frequency of Social Gatherings with Friends and Family: Once a week    Attends Religious Services: Never    Marine scientist or Organizations: No    Attends Archivist Meetings: Never    Marital Status: Married  Human resources officer Violence: Not At Risk (04/19/2020)   Humiliation, Afraid, Rape, and Kick questionnaire    Fear of Current or Ex-Partner: No    Emotionally Abused: No    Physically Abused: No    Sexually Abused: No    FAMILY HISTORY:  Family History  Problem Relation Age of Onset   Hypertension Mother        Cerebrovascular disease   Diabetes Mother    Coronary artery disease Father 56   Diabetes type II Father    Hypertension Father    Heart attack Father    Diabetes Brother    Hypertension Brother    Diabetes Sister    Hypertension Sister    Heart attack Sister 48   Leukemia Sister 17       CML   Breast cancer Maternal Grandmother        dx 47s   Lung cancer Maternal Uncle        dx >50, smoker   Cancer Cousin        mouth cancer, dx 72s,  no smoking/chew tobacco hx (maternal 1st cousin)   Ovarian cancer Cousin        dx <50 (maternal 1st cousin)   Stomach cancer Cousin        dx 92s (maternal 1st cousin)   Cancer Cousin        type unknown to pt, dx >50 (  paternal 1st cousin)    CURRENT MEDICATIONS:  Current Outpatient Medications  Medication Sig Dispense Refill   allopurinol (ZYLOPRIM) 100 MG tablet TAKE 1 TABLET(100 MG) BY MOUTH DAILY 90 tablet 0   apalutamide (ERLEADA) 60 MG tablet TAKE 4 TABLETS (240 MG TOTAL) BY MOUTH DAILY. MAY BE TAKEN WITH OR WITHOUT FOOD. SWALLOW TABLETS WHOLE. 120 tablet 3   aspirin EC 325 MG tablet Take 1 tablet (325 mg total) daily by mouth. 30 tablet 0   Blood Glucose Monitoring Suppl (BLOOD GLUCOSE SYSTEM PAK) KIT Use as directed to monitor FSBS 1x daily. Dx: E11.9. 1 kit 1   Cyanocobalamin (VITAMIN B 12) 500 MCG TABS Take 500 mcg by mouth in the morning.     diclofenac Sodium (VOLTAREN) 1 % GEL 2 g 2 (two) times daily.     FARXIGA 5 MG TABS tablet Take 5 mg by mouth daily.     fluticasone (FLONASE) 50 MCG/ACT nasal spray Place 2 sprays into both nostrils daily. 16 g 6   furosemide (LASIX) 20 MG tablet TAKE 3 TABLETS BY MOUTH DAILY 270 tablet 3   glipiZIDE (GLUCOTROL XL) 5 MG 24 hr tablet Take 1 tablet (5 mg total) by mouth daily with breakfast. 90 tablet 3   glucose blood (ONETOUCH ULTRA) test strip Use as instructed to monitor glucose twice daily. 100 strip 3   isosorbide mononitrate (IMDUR) 30 MG 24 hr tablet Take 1 tablet (30 mg total) by mouth daily. 90 tablet 3   Lactulose 20 GM/30ML SOLN Take 30 mLs (20 g total) by mouth daily as needed. Take 30 ml by mouth every 3 hours until you have bowel movement then daily as needed 450 mL 3   Lancets (ONETOUCH DELICA PLUS [id number]) MISC USE EVERY DAY FOR GLUCOSE TESTING     levothyroxine (SYNTHROID) 200 MCG tablet Take 1 tablet (200 mcg total) by mouth daily before breakfast. 90 tablet 3   levothyroxine (SYNTHROID) 25 MCG tablet Take 1 tablet (25  mcg total) by mouth See admin instructions. Takes along with 200 mcg to total 225 mcg daily 90 tablet 3   loratadine (CLARITIN) 10 MG tablet TAKE 1 TABLET(10 MG) BY MOUTH DAILY AS NEEDED FOR ALLERGIES 30 tablet 2   magnesium oxide (MAG-OX) 400 (240 Mg) MG tablet TAKE 1 TABLET(400 MG) BY MOUTH TWICE DAILY 180 tablet 2   metFORMIN (GLUCOPHAGE) 500 MG tablet Take 1 tablet (500 mg total) by mouth daily with breakfast. 90 tablet 3   naloxone (NARCAN) nasal spray 4 mg/0.1 mL Place 4 mg into the nose once as needed (accidental overdose).     nitroGLYCERIN (NITROSTAT) 0.4 MG SL tablet PLACE 1 TABLET UNDER THE TONGUE EVERY 5 MINUTES AS NEEDED FOR CHEST PAIN. CALL 911 AT THIRD DOSE IN 15 MINUTES 25 tablet 1   oxyCODONE-acetaminophen (PERCOCET) 10-325 MG tablet Take 1 tablet by mouth every 8 (eight) hours as needed for pain. 90 tablet 0   pantoprazole (PROTONIX) 40 MG tablet TAKE 1 TABLET(40 MG) BY MOUTH DAILY 30 tablet 3   potassium chloride SA (KLOR-CON M) 20 MEQ tablet Take 1 tablet (20 mEq total) by mouth daily. 30 tablet 3   rosuvastatin (CRESTOR) 40 MG tablet Take 40 mg by mouth every evening.     silodosin (RAPAFLO) 8 MG CAPS capsule TAKE 1 CAPSULE BY MOUTH TWICE DAILY 180 capsule 3   solifenacin (VESICARE) 10 MG tablet TAKE 1 TABLET(10 MG) BY MOUTH DAILY 30 tablet 11   sotalol (BETAPACE) 80 MG tablet Take  1 tablet (80 mg total) by mouth 2 (two) times daily. 180 tablet 3   Vitamin D, Ergocalciferol, (DRISDOL) 1.25 MG (50000 UNIT) CAPS capsule TAKE 1 CAPSULE BY MOUTH EVERY 7 DAYS 12 capsule 0   No current facility-administered medications for this visit.    ALLERGIES:  Allergies  Allergen Reactions   Trazodone And Nefazodone     Nightmares   Lactose Intolerance (Gi) Other (See Comments)    UPSET STOMACH     PHYSICAL EXAM:  Performance status (ECOG): 1 - Symptomatic but completely ambulatory  Vitals:   05/30/22 1424  BP: 113/67  Pulse: 70  Resp: 18  Temp: 97.7 F (36.5 C)  SpO2: 100%    Wt Readings from Last 3 Encounters:  05/30/22 291 lb 12.8 oz (132.4 kg)  04/25/22 293 lb (132.9 kg)  03/07/22 293 lb 8 oz (133.1 kg)   Physical Exam Vitals reviewed.  Constitutional:      Appearance: Normal appearance. He is obese.  Cardiovascular:     Rate and Rhythm: Normal rate and regular rhythm.     Pulses: Normal pulses.     Heart sounds: Normal heart sounds.  Pulmonary:     Effort: Pulmonary effort is normal.     Breath sounds: Normal breath sounds.  Neurological:     General: No focal deficit present.     Mental Status: He is alert and oriented to person, place, and time.  Psychiatric:        Mood and Affect: Mood normal.        Behavior: Behavior normal.      LABORATORY DATA:  I have reviewed the labs as listed.     Latest Ref Rng & Units 05/25/2022    3:10 PM 02/28/2022   12:00 PM 02/12/2022    1:49 AM  CBC  WBC 4.0 - 10.5 K/uL 6.2  6.7  7.3   Hemoglobin 13.0 - 17.0 g/dL 10.1  11.2  11.3   Hematocrit 39.0 - 52.0 % 32.9  34.7  35.8   Platelets 150 - 400 K/uL 136  131  135       Latest Ref Rng & Units 05/25/2022    3:10 PM 02/28/2022   12:00 PM 02/12/2022    1:49 AM  CMP  Glucose 70 - 99 mg/dL 119  148  139   BUN 8 - 23 mg/dL _0 Creatinine 0.61 - 1.24 mg/dL 1.18  1.31  1.26   Sodium 135 - 145 mmol/L 138  136  139   Potassium 3.5 - 5.1 mmol/L 3.9  3.8  4.2   Chloride 98 - 111 mmol/L 111  108  108   CO2 22 - 32 mmol/L _1 Calcium 8.9 - 10.3 mg/dL 9.0  8.9  9.3   Total Protein 6.5 - 8.1 g/dL 7.5  7.6  7.1   Total Bilirubin 0.3 - 1.2 mg/dL 0.5  0.6  0.4   Alkaline Phos 38 - 126 U/L 77  97  86   AST 15 - 41 U/L _2 ALT 0 - 44 U/L _3 DIAGNOSTIC IMAGING:  I have independently reviewed the scans and discussed with the patient. CUP PACEART REMOTE DEVICE CHECK  Result Date: 05/10/2022 Scheduled remote reviewed. Normal device function.  There were 5 atrial arrhythmias detected that were all less than 10 minutes, there  were two high ventricular  rates detected that appear to be atrial tachycardia Next remote 91 days. Kathy Breach, RN, CCDS, [employer]    ASSESSMENT:  1.  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 -Status post IMRT, 40 sessions completed on 06/14/2016 by Dr. Tammi Klippel. -PSA 0.7 (10/15/2018), 0.8 (02/18/2019), 1.4 (December 2020), 1.4 (June 2021) -Prostate biopsy on 02/10/2020-1 out of 12 cores from left mid lateral region positive for adenocarcinoma. -CTAP on 03/09/2020 with newly enlarged left iliac lymph nodes measuring 1.1 x 0.9 cm.  Hepatic steatosis with early signs of cirrhosis. -F-18 PSMA PET scan on 04/12/2020 showed radiotracer activity associated with enlarged left external iliac lymph node 9 mm with SUV 7.6.  Small left common iliac lymph node 6 mm, SUV 8.6.  Left periaortic lymph node 5 mm, SUV 6.3.  Lymph node between IVC and aorta at the level of right renal hilum, SUV 8.6.  Lymph node deep to the IVC measuring 6 mm, SUV 5.4.  No skeletal metastasis. 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 - Firmagon started on 04/21/2020 - Apalutamide 240 milligrams daily started around 04/22/2020.   2.  Social/family history: -He is a retired Nature conservation officer.  He lives at home with his wife.  He plays golf.  He quit smoking in 2015, smoked 1 pack/day for more than 20 years. -Sister has CML.  Maternal grandmother had breast cancer.  Maternal uncle had lung cancer.  2 maternal cousins had tongue cancer and uterine cancer.   3.  Cirrhosis: -Prior imaging showed cirrhosis of the liver with normal spleen.   PLAN:  1.  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 - I have reviewed labs from 05/25/2022 which showed normal calcium  and creatinine and LFTs.  CBC was grossly normal with normocytic anemia. - PSA is undetectable. - Continue apalutamide 240 mg daily.  Continue degarelix injection today and monthly. - RTC 3 months for follow-up with repeat PSA and other labs.   2.  Normocytic anemia: - Last Feraheme was on 12/28/2021. - Hemoglobin today is 10.1 and ferritin is 19. - I have recommended Feraheme weekly x 2.  Will plan to repeat ferritin and iron panel in 3 months.   3.  Mild thrombocytopenia: - Mild thrombocytopenia on and off since December 2020 has been stable.  Platelet count is 136.  4.  Osteopenia (DEXA 11/15/2021 T score -1.6): - He is taking vitamin D 50,000 units weekly. - We talked about starting him on Prolia to prevent worsening of bone mineral density while he is on antiandrogens. - We discussed side effects including rare chance of ONJ.  He reports that he had bad teeth. - I have recommended dental evaluation prior to starting Prolia.    Orders placed this encounter:  Orders Placed This Encounter  Procedures   PSA   CBC with Differential/Platelet   Comprehensive metabolic panel   Iron and TIBC   Ferritin      Derek Jack, MD [place] [phone]

## 2022-06-02 NOTE — Progress Notes (Signed)
Remote pacemaker transmission.   

## 2022-06-06 ENCOUNTER — Ambulatory Visit: Payer: Medicare Other | Admitting: Urology

## 2022-06-08 ENCOUNTER — Inpatient Hospital Stay: Payer: 59 | Attending: Hematology

## 2022-06-08 VITALS — BP 114/55 | HR 62 | Temp 97.9°F | Resp 18

## 2022-06-08 DIAGNOSIS — C61 Malignant neoplasm of prostate: Secondary | ICD-10-CM | POA: Diagnosis present

## 2022-06-08 DIAGNOSIS — E039 Hypothyroidism, unspecified: Secondary | ICD-10-CM | POA: Insufficient documentation

## 2022-06-08 DIAGNOSIS — I10 Essential (primary) hypertension: Secondary | ICD-10-CM | POA: Insufficient documentation

## 2022-06-08 DIAGNOSIS — Z5111 Encounter for antineoplastic chemotherapy: Secondary | ICD-10-CM | POA: Diagnosis present

## 2022-06-08 DIAGNOSIS — Z79899 Other long term (current) drug therapy: Secondary | ICD-10-CM | POA: Diagnosis not present

## 2022-06-08 DIAGNOSIS — E785 Hyperlipidemia, unspecified: Secondary | ICD-10-CM | POA: Insufficient documentation

## 2022-06-08 MED ORDER — SODIUM CHLORIDE 0.9 % IV SOLN
510.0000 mg | Freq: Once | INTRAVENOUS | Status: AC
  Administered 2022-06-08: 510 mg via INTRAVENOUS
  Filled 2022-06-08: qty 17

## 2022-06-08 MED ORDER — SODIUM CHLORIDE 0.9 % IV SOLN: Freq: Once | INTRAVENOUS | Status: AC

## 2022-06-08 MED ORDER — LORATADINE 10 MG PO TABS
10.0000 mg | ORAL_TABLET | Freq: Once | ORAL | Status: AC
  Administered 2022-06-08: 10 mg via ORAL
  Filled 2022-06-08: qty 1

## 2022-06-08 MED ORDER — ACETAMINOPHEN 325 MG PO TABS
650.0000 mg | ORAL_TABLET | Freq: Once | ORAL | Status: AC
  Administered 2022-06-08: 650 mg via ORAL
  Filled 2022-06-08: qty 2

## 2022-06-08 NOTE — Progress Notes (Signed)
Patient presents today for iron infusion.  Patient is in satisfactory condition with no new complaints voiced. Vital signs are stable.  We will proceed with treatment per provider orders.  Patient tolerated treatment well with no complaints voiced.  Patient left ambulatory in stable condition.  Vital signs stable at discharge.  Follow up as scheduled.     

## 2022-06-08 NOTE — Patient Instructions (Signed)
[facility]  Discharge Instructions: Thank you for choosing [facility] to provide your oncology and hematology care.  If you have a lab appointment with the Cancer Center, please come in thru the Main Entrance and check in at the main information desk.  Wear comfortable clothing and clothing appropriate for easy access to any Portacath or PICC line.   We strive to give you quality time with your provider. You may need to reschedule your appointment if you arrive late (15 or more minutes).  Arriving late affects you and other patients whose appointments are after yours.  Also, if you miss three or more appointments without notifying the office, you may be dismissed from the clinic at the provider's discretion.      For prescription refill requests, have your pharmacy contact our office and allow 72 hours for refills to be completed.    To help prevent nausea and vomiting after your treatment, we encourage you to take your nausea medication as directed.  BELOW ARE SYMPTOMS THAT SHOULD BE REPORTED IMMEDIATELY: *FEVER GREATER THAN 100.4 F (38 C) OR HIGHER *CHILLS OR SWEATING *NAUSEA AND VOMITING THAT IS NOT CONTROLLED WITH YOUR NAUSEA MEDICATION *UNUSUAL SHORTNESS OF BREATH *UNUSUAL BRUISING OR BLEEDING *URINARY PROBLEMS (pain or burning when urinating, or frequent urination) *BOWEL PROBLEMS (unusual diarrhea, constipation, pain near the anus) TENDERNESS IN MOUTH AND THROAT WITH OR WITHOUT PRESENCE OF ULCERS (sore throat, sores in mouth, or a toothache) UNUSUAL RASH, SWELLING OR PAIN  UNUSUAL VAGINAL DISCHARGE OR ITCHING   Items with * indicate a potential emergency and should be followed up as soon as possible or go to the Emergency Department if any problems should occur.  Please show the CHEMOTHERAPY ALERT CARD or IMMUNOTHERAPY ALERT CARD at check-in to the Emergency Department and triage nurse.  Should you have questions after your visit or need to  cancel or reschedule your appointment, please contact [facility] [phone]  and follow the prompts.  Office hours are 8:00 a.m. to 4:30 p.m. Monday - Friday. Please note that voicemails left after 4:00 p.m. may not be returned until the following business day.  We are closed weekends and major holidays. You have access to a nurse at all times for urgent questions. Please call the main number to the clinic [phone] and follow the prompts.  For any non-urgent questions, you may also contact your provider using MyChart. We now offer e-Visits for anyone 18 and older to request care online for non-urgent symptoms. For details visit [email or web address].   Also download the MyChart app! Go to the app store, search "MyChart", open the app, select [facility], and log in with your MyChart username and password.   

## 2022-06-15 ENCOUNTER — Inpatient Hospital Stay: Payer: 59

## 2022-06-15 VITALS — BP 109/60 | HR 62 | Temp 97.9°F | Resp 20

## 2022-06-15 DIAGNOSIS — C61 Malignant neoplasm of prostate: Secondary | ICD-10-CM

## 2022-06-15 DIAGNOSIS — Z5111 Encounter for antineoplastic chemotherapy: Secondary | ICD-10-CM | POA: Diagnosis not present

## 2022-06-15 MED ORDER — SODIUM CHLORIDE 0.9 % IV SOLN: Freq: Once | INTRAVENOUS | Status: AC

## 2022-06-15 MED ORDER — SODIUM CHLORIDE 0.9 % IV SOLN
510.0000 mg | Freq: Once | INTRAVENOUS | Status: AC
  Administered 2022-06-15: 510 mg via INTRAVENOUS
  Filled 2022-06-15: qty 510

## 2022-06-15 MED ORDER — ACETAMINOPHEN 325 MG PO TABS
650.0000 mg | ORAL_TABLET | Freq: Once | ORAL | Status: AC
  Administered 2022-06-15: 650 mg via ORAL
  Filled 2022-06-15: qty 2

## 2022-06-15 MED ORDER — LORATADINE 10 MG PO TABS
10.0000 mg | ORAL_TABLET | Freq: Once | ORAL | Status: AC
  Administered 2022-06-15: 10 mg via ORAL
  Filled 2022-06-15: qty 1

## 2022-06-15 NOTE — Progress Notes (Signed)
Feraheme infusion given per orders. Patient tolerated it well without problems. Vitals stable and discharged home from clinic ambulatory. Follow up as scheduled.  

## 2022-06-15 NOTE — Patient Instructions (Signed)
[facility]  Discharge Instructions: Thank you for choosing [facility] to provide your oncology and hematology care.  If you have a lab appointment with the [facility], please come in thru the Main Entrance and check in at the main information desk.  Wear comfortable clothing and clothing appropriate for easy access to any Portacath or PICC line.   We strive to give you quality time with your provider. You may need to reschedule your appointment if you arrive late (15 or more minutes).  Arriving late affects you and other patients whose appointments are after yours.  Also, if you miss three or more appointments without notifying the office, you may be dismissed from the clinic at the provider's discretion.      For prescription refill requests, have your pharmacy contact our office and allow 72 hours for refills to be completed.    Today you received an iron infusion   To help prevent nausea and vomiting after your treatment, we encourage you to take your nausea medication as directed.  BELOW ARE SYMPTOMS THAT SHOULD BE REPORTED IMMEDIATELY: *FEVER GREATER THAN 100.4 F (38 C) OR HIGHER *CHILLS OR SWEATING *NAUSEA AND VOMITING THAT IS NOT CONTROLLED WITH YOUR NAUSEA MEDICATION *UNUSUAL SHORTNESS OF BREATH *UNUSUAL BRUISING OR BLEEDING *URINARY PROBLEMS (pain or burning when urinating, or frequent urination) *BOWEL PROBLEMS (unusual diarrhea, constipation, pain near the anus) TENDERNESS IN MOUTH AND THROAT WITH OR WITHOUT PRESENCE OF ULCERS (sore throat, sores in mouth, or a toothache) UNUSUAL RASH, SWELLING OR PAIN  UNUSUAL VAGINAL DISCHARGE OR ITCHING   Items with * indicate a potential emergency and should be followed up as soon as possible or go to the Emergency Department if any problems should occur.  Please show the CHEMOTHERAPY ALERT CARD or IMMUNOTHERAPY ALERT CARD at check-in to the Emergency Department and triage nurse.  Should you have  questions after your visit or need to cancel or reschedule your appointment, please contact [facility] [phone]  and follow the prompts.  Office hours are 8:00 a.m. to 4:30 p.m. Monday - Friday. Please note that voicemails left after 4:00 p.m. may not be returned until the following business day.  We are closed weekends and major holidays. You have access to a nurse at all times for urgent questions. Please call the main number to the clinic [phone] and follow the prompts.  For any non-urgent questions, you may also contact your provider using MyChart. We now offer e-Visits for anyone 31 and older to request care online for non-urgent symptoms. For details visit [email or web address].   Also download the MyChart app! Go to the app store, search "MyChart", open the app, select [facility], and log in with your MyChart username and password.

## 2022-06-20 ENCOUNTER — Ambulatory Visit: Payer: Medicare Other | Admitting: Urology

## 2022-06-21 ENCOUNTER — Other Ambulatory Visit: Payer: Self-pay | Admitting: Hematology

## 2022-06-21 ENCOUNTER — Other Ambulatory Visit (HOSPITAL_COMMUNITY): Payer: Self-pay

## 2022-06-21 MED ORDER — APALUTAMIDE 60 MG PO TABS
ORAL_TABLET | ORAL | 3 refills | Status: DC
  Filled 2022-06-22: qty 120, 30d supply, fill #0
  Filled 2022-07-24: qty 120, 30d supply, fill #1
  Filled 2022-08-21: qty 120, 30d supply, fill #2
  Filled 2022-09-20: qty 120, 30d supply, fill #3

## 2022-06-22 ENCOUNTER — Other Ambulatory Visit (HOSPITAL_COMMUNITY): Payer: Self-pay

## 2022-06-24 ENCOUNTER — Encounter (HOSPITAL_COMMUNITY): Payer: Self-pay | Admitting: Hematology

## 2022-06-26 ENCOUNTER — Other Ambulatory Visit: Payer: Self-pay

## 2022-06-27 ENCOUNTER — Inpatient Hospital Stay: Payer: 59

## 2022-06-27 ENCOUNTER — Other Ambulatory Visit: Payer: Self-pay

## 2022-06-27 VITALS — BP 149/76 | HR 66 | Temp 96.9°F | Resp 16

## 2022-06-27 DIAGNOSIS — C61 Malignant neoplasm of prostate: Secondary | ICD-10-CM

## 2022-06-27 DIAGNOSIS — Z5111 Encounter for antineoplastic chemotherapy: Secondary | ICD-10-CM | POA: Diagnosis not present

## 2022-06-27 MED ORDER — DEGARELIX ACETATE 80 MG ~~LOC~~ SOLR
80.0000 mg | Freq: Once | SUBCUTANEOUS | Status: AC
  Administered 2022-06-27: 80 mg via SUBCUTANEOUS
  Filled 2022-06-27: qty 4

## 2022-06-27 NOTE — Patient Instructions (Signed)
[facility]  Discharge Instructions: Thank you for choosing [facility] to provide your oncology and hematology care.  If you have a lab appointment with the [facility], please come in thru the Main Entrance and check in at the main information desk.  Wear comfortable clothing and clothing appropriate for easy access to any Portacath or PICC line.   We strive to give you quality time with your provider. You may need to reschedule your appointment if you arrive late (15 or more minutes).  Arriving late affects you and other patients whose appointments are after yours.  Also, if you miss three or more appointments without notifying the office, you may be dismissed from the clinic at the provider's discretion.      For prescription refill requests, have your pharmacy contact our office and allow 72 hours for refills to be completed.    Today you received the following chemotherapy and/or immunotherapy agents [facility].  Degarelix Injection What is this medication? DEGARELIX (deg a REL ix) treats prostate cancer. It works by decreasing levels of the hormone testosterone in the body. This prevents prostate cancer cells from spreading or growing. It belongs to a group of medications called GnRH blockers. This medicine may be used for other purposes; ask your health care provider or pharmacist if you have questions. COMMON BRAND NAME(S): Degarelix, [place] Koller What should I tell my care team before I take this medication? They need to know if you have any of these conditions: Diabetes Heart disease Kidney disease Liver disease Low levels of potassium or magnesium in the blood Osteoporosis, weak bones An unusual or allergic reaction to degarelix, mannitol, other medications, foods, dyes, or preservatives If you or your partner are pregnant or trying to get pregnant How should I use this medication? This medication is injected under the skin. It is usually given  by your care team in a hospital or clinic setting. It may also be given at home. If you get this medication at home, you will be taught how to prepare and give it. Use exactly as directed. Take it as directed on the prescription label at the same time every day. Keep taking it unless your care team tells you to stop. It is important that you put your used needles and syringes in a special sharps container. Do not put them in a trash can. If you do not have a sharps container, call your pharmacist or care team to get one. Talk to your care team about the use of this medication in children. Special care may be needed. Overdosage: If you think you have taken too much of this medicine contact a poison control center or emergency room at once. NOTE: This medicine is only for you. Do not share this medicine with others. What if I miss a dose? If you get this medication at the hospital or clinic: It is important not to miss your dose. Call your care team if you are unable to keep an appointment. If you give yourself this medication at home: If you miss a dose, take it as soon as you can. If it is almost time for your next dose, take only that dose. Do not take double or extra doses. Call your care team with questions. What may interact with this medication? Do not take this medication with any of the following: Cisapride Dronedarone Pimozide Thioridazine This medication may also interact with the following: Other medications that cause heart rhythm changes This list may  not describe all possible interactions. Give your health care provider a list of all the medicines, herbs, non-prescription drugs, or dietary supplements you use. Also tell them if you smoke, drink alcohol, or use illegal drugs. Some items may interact with your medicine. What should I watch for while using this medication? Your condition will be monitored carefully while you are receiving this medication. You may need blood work while  taking this medication. Do not rub or scratch injection site. There may be a lump at the injection site, or it may be red or sore for a few days after your dose. This medication may cause infertility. Talk to your care team if you are concerned about your fertility. What side effects may I notice from receiving this medication? Side effects that you should report to your care team as soon as possible: Allergic reactions or angioedema--skin rash, itching or hives, swelling of the face, eyes, lips, tongue, arms, or legs, trouble swallowing or breathing Heart rhythm changes--fast or irregular heartbeat, dizziness, feeling faint or lightheaded, chest pain, trouble breathing Side effects that usually do not require medical attention (report to your care team if they continue or are bothersome): Hot flashes Pain, redness, or irritation at injection site Weight gain This list may not describe all possible side effects. Call your doctor for medical advice about side effects. You may report side effects to FDA at 1-800-FDA-1088. Where should I keep my medication? Keep out of the reach of children and pets. This medication is usually given in a hospital or clinic and will not be stored at home. In rare cases, this medication may be given at home. If you are using this medication at home, you will be instructed on how to store this medication. Get rid of any unused medication after the expiration date. To get rid of medications that are no longer needed or have expired: Take the medication to a medication take-back program. Check with your pharmacy or law enforcement to find a location. If you cannot return the medication, ask your pharmacist or care team how to get rid of this medication safely. NOTE: This sheet is a summary. It may not cover all possible information. If you have questions about this medicine, talk to your doctor, pharmacist, or health care provider.  2023 Elsevier/Gold Standard  (2021-10-05 00:00:00)        To help prevent nausea and vomiting after your treatment, we encourage you to take your nausea medication as directed.  BELOW ARE SYMPTOMS THAT SHOULD BE REPORTED IMMEDIATELY: *FEVER GREATER THAN 100.4 F (38 C) OR HIGHER *CHILLS OR SWEATING *NAUSEA AND VOMITING THAT IS NOT CONTROLLED WITH YOUR NAUSEA MEDICATION *UNUSUAL SHORTNESS OF BREATH *UNUSUAL BRUISING OR BLEEDING *URINARY PROBLEMS (pain or burning when urinating, or frequent urination) *BOWEL PROBLEMS (unusual diarrhea, constipation, pain near the anus) TENDERNESS IN MOUTH AND THROAT WITH OR WITHOUT PRESENCE OF ULCERS (sore throat, sores in mouth, or a toothache) UNUSUAL RASH, SWELLING OR PAIN  UNUSUAL VAGINAL DISCHARGE OR ITCHING   Items with * indicate a potential emergency and should be followed up as soon as possible or go to the Emergency Department if any problems should occur.  Please show the CHEMOTHERAPY ALERT CARD or IMMUNOTHERAPY ALERT CARD at check-in to the Emergency Department and triage nurse.  Should you have questions after your visit or need to cancel or reschedule your appointment, please contact [facility] [phone]  and follow the prompts.  Office hours are 8:00 a.m. to 4:30 p.m.  Monday - Friday. Please note that voicemails left after 4:00 p.m. may not be returned until the following business day.  We are closed weekends and major holidays. You have access to a nurse at all times for urgent questions. Please call the main number to the clinic [phone] and follow the prompts.  For any non-urgent questions, you may also contact your provider using MyChart. We now offer e-Visits for anyone 42 and older to request care online for non-urgent symptoms. For details visit [email or web address].   Also download the MyChart app! Go to the app store, search "MyChart", open the app, select [facility], and log in with your MyChart username and password.

## 2022-06-27 NOTE — Progress Notes (Signed)
Patient tolerated Firmagon injection with no complaints voiced.  Site clean and dry with no bruising or swelling noted.  No complaints of pain.  Discharged with vital signs stable and no signs or symptoms of distress noted.

## 2022-07-07 ENCOUNTER — Encounter (HOSPITAL_COMMUNITY): Payer: Self-pay | Admitting: Hematology

## 2022-07-12 ENCOUNTER — Telehealth: Payer: Self-pay | Admitting: *Deleted

## 2022-07-12 NOTE — Telephone Encounter (Signed)
Message sent to [place], [place] scheduler to call the pt with an appt for pre op.

## 2022-07-12 NOTE — Telephone Encounter (Signed)
ADDENDUM TO CLEARANCE REQUEST:   PROCEDURE IS 10 TEETH FOR EXTRACTION  UNDER LOCAL  DR. Farrel Demark, DDS

## 2022-07-12 NOTE — Telephone Encounter (Signed)
   Name: Peter Dunlap  DOB: 07-11-1955  MRN: [id number]  Primary Cardiologist: [name], MD  Chart reviewed as part of pre-operative protocol coverage. Because of Peter Dunlap's past medical history and time since last visit, he will require a follow-up in-office visit in order to better assess preoperative cardiovascular risk.  Pre-op covering staff: - Please schedule appointment and call patient to inform them. If patient already had an upcoming appointment within acceptable timeframe, please add "pre-op clearance" to the appointment notes so provider is aware. - Please contact requesting surgeon's office via preferred method (i.e, phone, fax) to inform them of need for appointment prior to surgery.   Lenna Sciara, NP  07/12/2022, 1:37 PM

## 2022-07-12 NOTE — Telephone Encounter (Signed)
   Pre-operative Risk Assessment    Patient Name: OLSON LUCARELLI  DOB: [birth year] MRN: [id number]     Request for Surgical Clearance    Procedure:  Dental Extraction - Amount of Teeth to be Pulled:  TBD  Date of Surgery:  Clearance TBD                                 Surgeon:  Dr. April Surgeon's Group or Practice Name:  [facility] Phone number:  [phone] Fax number:  [phone]   Type of Clearance Requested:   - Medical  - Pharmacy:  Hold Aspirin Not Indicated   Type of Anesthesia:  Local with Epinephrine   Additional requests/questions:    Signed, Greer Ee   07/12/2022, 6:38 AM

## 2022-07-13 NOTE — Telephone Encounter (Signed)
Pt has been scheduled for an appointment, 07/25/22, clearance will be addressed at that time.  Will route back to the requesting surgeon's office to make them aware.

## 2022-07-19 ENCOUNTER — Other Ambulatory Visit (HOSPITAL_COMMUNITY): Payer: Self-pay

## 2022-07-20 NOTE — Progress Notes (Addendum)
Cardiology Office Note Date:  01/15/2020  Patient ID:  Peter Dunlap, Peter Dunlap February 11, 1956, MRN [id number] PCP:  [name], MD  Cardiologist:  Dr. Lovena Le   Chief Complaint:   pre-op clearance  History of Present Illness: Peter Dunlap is a 67 y.o. male with history of  CAD (10/2010:50% mid LAD, diffuse distal disease, circumflex irregularities, large dominant RCA with a 50% ostial, 70% distal, 60% posterolateral and 70% PDA >>> 2015 DES to LAD),  PVD (right CEA 2010),  HTN, HLD, OSA w/CPAP,  SVT (ablation 2005),  Afib s/p Watchman 2017 given h/o CNS bleed tachy brady w/PPM,  hypothyroidism (s/p RAI).   I saw him 01/15/20 He is  doing well.  Unfortunately they are evaluating for perhaps recurrent cancer. He denies any cardiac concerns.  No CP, palpitations or cardiac awareness.  No SOB or DOE, denies any difficulties with his ADLs.  He does not exercise, but stays busy. No dizziness, near syncope or syncope. Sees his PMD routinely and his labs are done there RCRI score: 2 (for CAD and cerebrovascular disease), 6.6% DUKE: 8.97METS Felt a reasonable cardiac candidate for planned procedure. QTc was somewhat prolonged but stable, planned to review with MD  He saw Dr. Lovena Le in 2022, and most recently 09/27/21, was doing well, had lost weight, undergone tx for prostate Ca.  SOtalol dose reducedd to 43m BID 2/2 QT prolongation   Now he requires pre-op evaluation for dental extractions (10 teeth)  RCRI score is 2, 6/6% DUKE 6.36 Reports stable and good exertional capacity for years.  TODAY He is doing well No CP, rare brief palpitations No SOB, will get winded after doing a lap around the yard with his push mower. This has been stable for years, able to walk up a flight of stairs without DOE, CP, no difficulties with his ADLs He does have a bad back and this limits how much lifting/heavier activities he can do. No near syncope or syncope   AAD hx Sotalol started 2017,  up titrated to current dose Dec 2017, remains current  Device information SJM dual chamber PPM, implanted 09/18/2014   Past Medical History:  Diagnosis Date   Arteriosclerotic cardiovascular disease (ASCVD) 2005   catheterization in 10/2010:50% mid LAD, diffuse distal disease, circumflex irregularities, large dominant RCA with a 50% ostial, 70% distal, 60% posterolateral and 70% PDA; normal EF   Arthritis    Benign prostatic hypertrophy    Bilateral carpal tunnel syndrome 07/03/2018   Cerebrovascular disease 2010   R. carotid endarterectomy; Duplex in 10/2010-widely patent ICAs, subtotal left vertebral-not thought to be contributing to symptoms   Cervical spine disease    CT in 2012-advanced degeneration and spondylosis with moderate spinal stenosis at C3-C6   Depression    Erectile dysfunction    Gastroesophageal reflux disease    H/O hiatal hernia    H/O: substance abuse ([facility]    Cocaine, marijuana, alcohol.  Quit 2013.    Hyperlipidemia    Hypertension    Non-ST elevation myocardial infarction (NSTEMI), initial episode of care ([facility] 12/02/2013   DES LAD   Obesity    Prostate cancer ([facility]    Sleep apnea    CPAP   Tachy-brady syndrome ([facility] [place]    a. s/p STJ dual chamber PPM    Thyroid disease    Tobacco abuse    Quit 2014   Ulnar neuropathy at elbow 07/03/2018   Bilateral    Past Surgical History:  Procedure Laterality Date  BRAIN SURGERY  2015   hematoma evacuation   BURR HOLE Right 04/13/2014   Procedure: Haskell Flirt;  Surgeon: Charlie Pitter, MD;  Location: [facility];  Service: Neurosurgery;  Laterality: Right;   CAROTID ENDARTERECTOMY Right Feb. 25, 2010    CEA   CORONARY ANGIOPLASTY WITH STENT PLACEMENT  12/03/2013   LAD 90%-->0% W/ Promus Premier DES 3.0 mm x 16 mm, CFX OK, RCA 40%, EF 70-75%   LEFT ATRIAL APPENDAGE OCCLUSION N/A 08/05/2015   Procedure: LEFT ATRIAL APPENDAGE OCCLUSION;  Surgeon: [name], MD;  Location: [facility];  Service: Cardiovascular;   Laterality: N/A;   LEFT HEART CATHETERIZATION WITH CORONARY ANGIOGRAM Left 12/03/2013   Procedure: LEFT HEART CATHETERIZATION WITH CORONARY ANGIOGRAM;  Surgeon: Leonie Man, MD;  Location[facility] CATH LAB;  Service: Cardiovascular;  Laterality: Left;   LEFT HEART CATHETERIZATION WITH CORONARY ANGIOGRAM N/A 01/26/2014   Procedure: LEFT HEART CATHETERIZATION WITH CORONARY ANGIOGRAM;  Surgeon: Jettie Booze, MD;  Location: [facility] CATH LAB;  Service: Cardiovascular;  Laterality: N/A;   LEFT HEART CATHETERIZATION WITH CORONARY ANGIOGRAM N/A 08/03/2014   Procedure: LEFT HEART CATHETERIZATION WITH CORONARY ANGIOGRAM;  Surgeon: Burnell Blanks, MD;  Location: [facility] CATH LAB;  Service: Cardiovascular;  Laterality: N/A;   PERCUTANEOUS CORONARY STENT INTERVENTION (PCI-S)  12/03/2013   Procedure: PERCUTANEOUS CORONARY STENT INTERVENTION (PCI-S);  Surgeon: Leonie Man, MD;  Location: [facility] CATH LAB;  Service: Cardiovascular;;   PERMANENT PACEMAKER INSERTION N/A 09/18/2014   Procedure: PERMANENT PACEMAKER INSERTION;  Surgeon: [name], MD;  Location[facility] CATH LAB;  Service: Cardiovascular;  Laterality: N/A;   RADIOFREQUENCY ABLATION  2005   for PSVT   TEE WITHOUT CARDIOVERSION N/A 07/27/2015   Procedure: TRANSESOPHAGEAL ECHOCARDIOGRAM (TEE);  Surgeon: Lelon Perla, MD;  Location: [facility] ENDOSCOPY;  Service: Cardiovascular;  Laterality: N/A;   TEE WITHOUT CARDIOVERSION N/A 09/15/2015   Procedure: TRANSESOPHAGEAL ECHOCARDIOGRAM (TEE);  Surgeon: Thayer Headings, MD;  Location: [facility] ENDOSCOPY;  Service: Cardiovascular;  Laterality: N/A;    Current Outpatient Medications  Medication Sig Dispense Refill   allopurinol (ZYLOPRIM) 100 MG tablet TAKE 1 TABLET(100 MG) BY MOUTH DAILY 90 tablet 0   aspirin EC 325 MG tablet Take 1 tablet (325 mg total) daily by mouth. 30 tablet 0   Blood Glucose Monitoring Suppl (BLOOD GLUCOSE SYSTEM PAK) KIT Use as directed to monitor FSBS 1x daily. Dx: E11.9. 1 kit 1   DEXILANT 60  MG capsule TAKE 1 CAPSULE(60 MG) BY MOUTH DAILY 30 capsule 5   furosemide (LASIX) 20 MG tablet TAKE 3 TABLETS BY MOUTH DAILY 270 tablet 3   glipiZIDE (GLUCOTROL XL) 2.5 MG 24 hr tablet TAKE 1 TABLET(2.5 MG) BY MOUTH DAILY WITH BREAKFAST 90 tablet 0   isosorbide mononitrate (IMDUR) 60 MG 24 hr tablet TAKE 1 TABLET BY MOUTH DAILY 90 tablet 3   levofloxacin (LEVAQUIN) 750 MG tablet Take 1 tablet (750 mg total) by mouth daily. 1 tablet 0   levothyroxine (SYNTHROID) 200 MCG tablet TAKE 1 TABLET(200 MCG) BY MOUTH DAILY BEFORE BREAKFAST 90 tablet 0   loratadine (CLARITIN) 10 MG tablet Take 1 tablet (10 mg total) by mouth daily as needed for allergies. 30 tablet 2   Magnesium Oxide 400 (240 Mg) MG TABS TAKE 1 TABLET BY MOUTH TWICE DAILY 60 tablet 8   metFORMIN (GLUCOPHAGE) 500 MG tablet TAKE 1 TABLET BY MOUTH TWICE DAILY 180 tablet 0   nitroGLYCERIN (NITROSTAT) 0.4 MG SL tablet PLACE 1 TABLET UNDER  THE TONGUE EVERY 5 MINUTES AS NEEDED FOR CHEST PAIN. CALL 911 AT THIRD DOSE IN 15 MINUTES 25 tablet 1   ONETOUCH ULTRA test strip USE TO TEST ONCE DAILY     oxyCODONE-acetaminophen (PERCOCET) 10-325 MG tablet Take 1 tablet by mouth every 8 (eight) hours as needed for pain. 90 tablet 0   potassium chloride SA (KLOR-CON) 20 MEQ tablet Take 1 tablet (20 mEq total) by mouth daily. 90 tablet 3   simvastatin (ZOCOR) 40 MG tablet TAKE 1 TABLET(40 MG) BY MOUTH DAILY 90 tablet 1   sotalol (BETAPACE) 160 MG tablet TAKE 1 TABLET(160 MG) BY MOUTH TWICE DAILY 180 tablet 3   tamsulosin (FLOMAX) 0.4 MG CAPS capsule Take 1 capsule (0.4 mg total) by mouth in the morning and at bedtime. 60 capsule 0   Vitamin D, Ergocalciferol, (DRISDOL) 1.25 MG (50000 UNIT) CAPS capsule Take 1 capsule (50,000 Units total) by mouth every 7 (seven) days. 12 capsule 0   No current facility-administered medications for this visit.    Allergies:   Trazodone and nefazodone and Lactose intolerance (gi)   Social History:  The patient  reports that  he quit smoking about 7 years ago. His smoking use included cigarettes. He started smoking about 47 years ago. He has a 40.00 pack-year smoking history. He has never used smokeless tobacco. He reports that he does not drink alcohol and does not use drugs.   Family History:  The patient's family history includes Cancer in his maternal grandmother, maternal uncle, and sister; Coronary artery disease (age of onset: 28) in his father; Diabetes in his brother, mother, and sister; Diabetes type II in his father; Heart attack in his father; Heart attack (age of onset: 59) in his sister; Hypertension in his brother, father, mother, and sister; Lung cancer in his paternal uncle.  ROS:  Please see the history of present illness. All other systems are reviewed and otherwise negative.   PHYSICAL EXAM:  VS:  BP 116/70   Pulse 65   Ht 6' (1.829 m)   Wt (!) 315 lb (142.9 kg)   SpO2 96%   BMI 42.72 kg/m  BMI: Body mass index is 42.72 kg/m. Well nourished, well developed, in no acute distress  HEENT: normocephalic, atraumatic  Neck: no JVD, carotid bruits or masses Cardiac:  RRR; [id number] blowing systolic murmur, no rubs, or gallops Lungs:  CTA b/l, no wheezing, rhonchi or rales  Abd: soft, nontender, obese MS: no deformity or atrophy Ext:  no edema  Skin: warm and dry, no rash Neuro:  No gross deficits appreciated Psych: euthymic mood, full affect   PPM site is stable, no tethering or discomfort   EKG:  Done today and reviewed by myself shows  SR 64bpm, manually measured QT by myself 560m, QTc 5378mStable/reviewed with Dr. TaLovena LePPSpecial Care Hospitalnterrogation done today and reviewed by myself Battery and lead measurements are good HVR episoes are brief RVR, no VT Low AF burden, all available EGMs reviewed, most are breif PATs, some true AFlutter, faster rates, though short episodes Burden is <1%   09/15/2015: TTE Study Conclusions  - Left ventricle: The cavity size was normal. Wall thickness was     normal. Systolic function was normal.  - Mitral valve: No evidence of vegetation. There was mild    regurgitation.  - Atrial septum: No defect or patent foramen ovale was identified.    Echo contrast study showed no right-to-left atrial level shunt,    following an increase in  RA pressure induced by provocative    maneuvers.    Recent Labs: 05/19/2019: ALT 17 08/25/2019: Hemoglobin 15.0; Platelets 129 12/24/2019: TSH 12.05 01/15/2020: BUN 11; Creatinine, Ser 1.22; Magnesium 1.9; Potassium 4.1; Sodium 137  05/16/2019: Cholesterol 124; HDL 35; LDL Cholesterol (Calc) 69; Total CHOL/HDL Ratio 3.5; Triglycerides 122   Estimated Creatinine Clearance: 89.7 mL/min (by C-G formula based on SCr of 1.22 mg/dL).   Wt Readings from Last 3 Encounters:  01/15/20 (!) 315 lb (142.9 kg)  01/06/20 (!) 317 lb (143.8 kg)  01/01/20 (!) 319 lb (144.7 kg)     Other studies reviewed: Additional studies/records reviewed today include: summarized above  ASSESSMENT AND PLAN:  1. PPM      Intact function      no programming changes made  2. CAD      No anginal symptoms     On ASA, BB, statin, nitrate  3. PVD     H/o R CEA     On ASA, statin     He reports seeing vascular specialist annually  4. Paroxysmal Afib     S/p watchman, off a/c      <1 % burden      On sotalol since 2017     Stable QTc     Labs today       5. HTN     Looks good  6. HLD     Follows labs with his PMD  7. Murmur No symptoms Update his echo given has been some years  8. Pre-op     Cardiac risk score is 6.6%     Low cardiac risk procedure     No cardiac contraindication to dental extractions     OK to hold ASA temporarily    Disposition: remotes as usual we can see him back in 71mo sooner if needed  Current medicines are reviewed at length with the patient today.  The patient did not have any concerns regarding medicines.  SVenetia Night PA-C 01/15/2020 4:48 PM     [place] [phone](office)  [phone](fax)

## 2022-07-21 ENCOUNTER — Ambulatory Visit: Payer: 59 | Attending: Student | Admitting: Physician Assistant

## 2022-07-21 ENCOUNTER — Encounter: Payer: Self-pay | Admitting: Physician Assistant

## 2022-07-21 VITALS — BP 110/56 | HR 69 | Ht 72.0 in | Wt 286.4 lb

## 2022-07-21 DIAGNOSIS — R011 Cardiac murmur, unspecified: Secondary | ICD-10-CM | POA: Diagnosis not present

## 2022-07-21 DIAGNOSIS — Z950 Presence of cardiac pacemaker: Secondary | ICD-10-CM

## 2022-07-21 DIAGNOSIS — Z01818 Encounter for other preprocedural examination: Secondary | ICD-10-CM

## 2022-07-21 DIAGNOSIS — I10 Essential (primary) hypertension: Secondary | ICD-10-CM

## 2022-07-21 DIAGNOSIS — I480 Paroxysmal atrial fibrillation: Secondary | ICD-10-CM | POA: Diagnosis not present

## 2022-07-21 DIAGNOSIS — Z5181 Encounter for therapeutic drug level monitoring: Secondary | ICD-10-CM

## 2022-07-21 DIAGNOSIS — I2510 Atherosclerotic heart disease of native coronary artery without angina pectoris: Secondary | ICD-10-CM

## 2022-07-21 DIAGNOSIS — Z79899 Other long term (current) drug therapy: Secondary | ICD-10-CM

## 2022-07-21 LAB — CUP PACEART INCLINIC DEVICE CHECK
Battery Remaining Longevity: 45 mo
Battery Voltage: 2.95 V
Brady Statistic RA Percent Paced: 5.7 %
Brady Statistic RV Percent Paced: 2.7 %
Date Time Interrogation Session: 20240216110509
Implantable Lead Connection Status: 753985
Implantable Lead Connection Status: 753985
Implantable Lead Implant Date: 20160415
Implantable Lead Implant Date: 20160415
Implantable Lead Location: 753859
Implantable Lead Location: 753860
Implantable Pulse Generator Implant Date: 20160415
Lead Channel Impedance Value: 425 Ohm
Lead Channel Impedance Value: 600 Ohm
Lead Channel Pacing Threshold Amplitude: 0.75 V
Lead Channel Pacing Threshold Amplitude: 0.75 V
Lead Channel Pacing Threshold Amplitude: 0.75 V
Lead Channel Pacing Threshold Amplitude: 0.75 V
Lead Channel Pacing Threshold Pulse Width: 0.5 ms
Lead Channel Pacing Threshold Pulse Width: 0.5 ms
Lead Channel Pacing Threshold Pulse Width: 0.5 ms
Lead Channel Pacing Threshold Pulse Width: 0.5 ms
Lead Channel Sensing Intrinsic Amplitude: 12 mV
Lead Channel Sensing Intrinsic Amplitude: 4 mV
Lead Channel Setting Pacing Amplitude: 2 V
Lead Channel Setting Pacing Amplitude: 2.5 V
Lead Channel Setting Pacing Pulse Width: 0.5 ms
Lead Channel Setting Sensing Sensitivity: 2 mV
Pulse Gen Model: 2240
Pulse Gen Serial Number: 7756161

## 2022-07-21 NOTE — Patient Instructions (Addendum)
Medication Instructions:   Your physician recommends that you continue on your current medications as directed. Please refer to the Current Medication list given to you today.   *If you need a refill on your cardiac medications before your next appointment, please call your pharmacy*   Lab Work:   [facility] AND [place]   If you have labs (blood work) drawn today and your tests are completely normal, you will receive your results only by: [place] (if you have MyChart) OR A paper copy in the mail If you have any lab test that is abnormal or we need to change your treatment, we will call you to review the results.   Testing/Procedures: Your physician has requested that you have an echocardiogram. Echocardiography is a painless test that uses sound waves to create images of your heart. It provides your doctor with information about the size and shape of your heart and how well your heart's chambers and valves are working. This procedure takes approximately one hour. There are no restrictions for this procedure. Please do NOT wear cologne, perfume, aftershave, or lotions (deodorant is allowed). Please arrive 15 minutes prior to your appointment time.     Follow-Up: At [facility], you and your health needs are our priority.  As part of our continuing mission to provide you with exceptional heart care, we have created designated Provider Care Teams.  These Care Teams include your primary Cardiologist (physician) and Advanced Practice Providers (APPs -  Physician Assistants and Nurse Practitioners) who all work together to provide you with the care you need, when you need it.  We recommend signing up for the patient portal called "MyChart".  Sign up information is provided on this After Visit Summary.  MyChart is used to connect with patients for Virtual Visits (Telemedicine).  Patients are able to view lab/test results, encounter notes, upcoming appointments, etc.  Non-urgent  messages can be sent to your provider as well.   To learn more about what you can do with MyChart, go to [email or web address].    Your next appointment:    6 month(s)  Provider:   You may see Dr. Lovena Le  or one of the following Advanced Practice Providers on your designated Care Team:   Tommye Standard, [place] Legrand Como "Jonni Sanger" Chalmers Cater, [place]   Other Instructions

## 2022-07-22 LAB — BASIC METABOLIC PANEL
BUN/Creatinine Ratio: 13 (ref 10–24)
BUN: 14 mg/dL (ref 8–27)
CO2: 21 mmol/L (ref 20–29)
Calcium: 9.4 mg/dL (ref 8.6–10.2)
Chloride: 107 mmol/L — ABNORMAL HIGH (ref 96–106)
Creatinine, Ser: 1.07 mg/dL (ref 0.76–1.27)
Glucose: 136 mg/dL — ABNORMAL HIGH (ref 70–99)
Potassium: 3.9 mmol/L (ref 3.5–5.2)
Sodium: 142 mmol/L (ref 134–144)
eGFR: 77 mL/min/{1.73_m2} (ref >59)

## 2022-07-22 LAB — MAGNESIUM: Magnesium: 2 mg/dL (ref 1.6–2.3)

## 2022-07-24 ENCOUNTER — Other Ambulatory Visit (HOSPITAL_COMMUNITY): Payer: Self-pay

## 2022-07-25 ENCOUNTER — Ambulatory Visit: Payer: 59 | Admitting: Student

## 2022-07-25 ENCOUNTER — Inpatient Hospital Stay: Payer: 59 | Attending: Hematology

## 2022-07-25 VITALS — BP 119/65 | HR 60 | Temp 97.8°F | Resp 17 | Ht 73.0 in | Wt 290.8 lb

## 2022-07-25 DIAGNOSIS — M8580 Other specified disorders of bone density and structure, unspecified site: Secondary | ICD-10-CM | POA: Insufficient documentation

## 2022-07-25 DIAGNOSIS — Z79899 Other long term (current) drug therapy: Secondary | ICD-10-CM | POA: Insufficient documentation

## 2022-07-25 DIAGNOSIS — E039 Hypothyroidism, unspecified: Secondary | ICD-10-CM | POA: Diagnosis not present

## 2022-07-25 DIAGNOSIS — Z800 Family history of malignant neoplasm of digestive organs: Secondary | ICD-10-CM | POA: Diagnosis not present

## 2022-07-25 DIAGNOSIS — Z801 Family history of malignant neoplasm of trachea, bronchus and lung: Secondary | ICD-10-CM | POA: Diagnosis not present

## 2022-07-25 DIAGNOSIS — Z5111 Encounter for antineoplastic chemotherapy: Secondary | ICD-10-CM | POA: Insufficient documentation

## 2022-07-25 DIAGNOSIS — K7460 Unspecified cirrhosis of liver: Secondary | ICD-10-CM | POA: Diagnosis not present

## 2022-07-25 DIAGNOSIS — C778 Secondary and unspecified malignant neoplasm of lymph nodes of multiple regions: Secondary | ICD-10-CM | POA: Insufficient documentation

## 2022-07-25 DIAGNOSIS — Z191 Hormone sensitive malignancy status: Secondary | ICD-10-CM

## 2022-07-25 DIAGNOSIS — Z808 Family history of malignant neoplasm of other organs or systems: Secondary | ICD-10-CM | POA: Diagnosis not present

## 2022-07-25 DIAGNOSIS — Z8041 Family history of malignant neoplasm of ovary: Secondary | ICD-10-CM | POA: Insufficient documentation

## 2022-07-25 DIAGNOSIS — Z803 Family history of malignant neoplasm of breast: Secondary | ICD-10-CM | POA: Insufficient documentation

## 2022-07-25 DIAGNOSIS — Z833 Family history of diabetes mellitus: Secondary | ICD-10-CM | POA: Diagnosis not present

## 2022-07-25 DIAGNOSIS — D649 Anemia, unspecified: Secondary | ICD-10-CM | POA: Diagnosis not present

## 2022-07-25 DIAGNOSIS — Z8249 Family history of ischemic heart disease and other diseases of the circulatory system: Secondary | ICD-10-CM | POA: Insufficient documentation

## 2022-07-25 DIAGNOSIS — Z832 Family history of diseases of the blood and blood-forming organs and certain disorders involving the immune mechanism: Secondary | ICD-10-CM | POA: Insufficient documentation

## 2022-07-25 DIAGNOSIS — C61 Malignant neoplasm of prostate: Secondary | ICD-10-CM | POA: Diagnosis present

## 2022-07-25 DIAGNOSIS — D696 Thrombocytopenia, unspecified: Secondary | ICD-10-CM | POA: Diagnosis not present

## 2022-07-25 DIAGNOSIS — Z809 Family history of malignant neoplasm, unspecified: Secondary | ICD-10-CM | POA: Insufficient documentation

## 2022-07-25 MED ORDER — DEGARELIX ACETATE 80 MG ~~LOC~~ SOLR
80.0000 mg | Freq: Once | SUBCUTANEOUS | Status: AC
  Administered 2022-07-25: 80 mg via SUBCUTANEOUS
  Filled 2022-07-25: qty 4

## 2022-07-25 NOTE — Progress Notes (Signed)
Firmagon injection given per orders. Patient tolerated it well without problems. Vitals stable and discharged home from clinic ambulatory. Follow up as scheduled.

## 2022-07-25 NOTE — Patient Instructions (Signed)
[facility]  Discharge Instructions: Thank you for choosing [facility] to provide your oncology and hematology care.  If you have a lab appointment with the [facility], please come in thru the Main Entrance and check in at the main information desk.  Wear comfortable clothing and clothing appropriate for easy access to any Portacath or PICC line.   We strive to give you quality time with your provider. You may need to reschedule your appointment if you arrive late (15 or more minutes).  Arriving late affects you and other patients whose appointments are after yours.  Also, if you miss three or more appointments without notifying the office, you may be dismissed from the clinic at the provider's discretion.      For prescription refill requests, have your pharmacy contact our office and allow 72 hours for refills to be completed.    Today you received the following firmagon injection    To help prevent nausea and vomiting after your treatment, we encourage you to take your nausea medication as directed.  BELOW ARE SYMPTOMS THAT SHOULD BE REPORTED IMMEDIATELY: *FEVER GREATER THAN 100.4 F (38 C) OR HIGHER *CHILLS OR SWEATING *NAUSEA AND VOMITING THAT IS NOT CONTROLLED WITH YOUR NAUSEA MEDICATION *UNUSUAL SHORTNESS OF BREATH *UNUSUAL BRUISING OR BLEEDING *URINARY PROBLEMS (pain or burning when urinating, or frequent urination) *BOWEL PROBLEMS (unusual diarrhea, constipation, pain near the anus) TENDERNESS IN MOUTH AND THROAT WITH OR WITHOUT PRESENCE OF ULCERS (sore throat, sores in mouth, or a toothache) UNUSUAL RASH, SWELLING OR PAIN  UNUSUAL VAGINAL DISCHARGE OR ITCHING   Items with * indicate a potential emergency and should be followed up as soon as possible or go to the Emergency Department if any problems should occur.  Please show the CHEMOTHERAPY ALERT CARD or IMMUNOTHERAPY ALERT CARD at check-in to the Emergency Department and triage  nurse.  Should you have questions after your visit or need to cancel or reschedule your appointment, please contact [place] [phone]  and follow the prompts.  Office hours are 8:00 a.m. to 4:30 p.m. Monday - Friday. Please note that voicemails left after 4:00 p.m. may not be returned until the following business day.  We are closed weekends and major holidays. You have access to a nurse at all times for urgent questions. Please call the main number to the clinic [phone] and follow the prompts.  For any non-urgent questions, you may also contact your provider using MyChart. We now offer e-Visits for anyone 18 and older to request care online for non-urgent symptoms. For details visit [email or web address].   Also download the MyChart app! Go to the app store, search "MyChart", open the app, select [facility], and log in with your MyChart username and password.

## 2022-07-26 ENCOUNTER — Other Ambulatory Visit (HOSPITAL_COMMUNITY): Payer: Self-pay

## 2022-07-27 ENCOUNTER — Other Ambulatory Visit: Payer: Self-pay | Admitting: Physician Assistant

## 2022-08-09 ENCOUNTER — Ambulatory Visit: Payer: 59

## 2022-08-09 DIAGNOSIS — I495 Sick sinus syndrome: Secondary | ICD-10-CM

## 2022-08-09 LAB — CUP PACEART REMOTE DEVICE CHECK
Battery Remaining Longevity: 40 mo
Battery Remaining Percentage: 33 %
Battery Voltage: 2.95 V
Brady Statistic AP VP Percent: 2.3 %
Brady Statistic AP VS Percent: 29 %
Brady Statistic AS VP Percent: 1 %
Brady Statistic AS VS Percent: 68 %
Brady Statistic RA Percent Paced: 30 %
Brady Statistic RV Percent Paced: 2.8 %
Date Time Interrogation Session: 20240306043320
Implantable Lead Connection Status: 753985
Implantable Lead Connection Status: 753985
Implantable Lead Implant Date: 20160415
Implantable Lead Implant Date: 20160415
Implantable Lead Location: 753859
Implantable Lead Location: 753860
Implantable Pulse Generator Implant Date: 20160415
Lead Channel Impedance Value: 430 Ohm
Lead Channel Impedance Value: 580 Ohm
Lead Channel Pacing Threshold Amplitude: 0.75 V
Lead Channel Pacing Threshold Amplitude: 0.75 V
Lead Channel Pacing Threshold Pulse Width: 0.5 ms
Lead Channel Pacing Threshold Pulse Width: 0.5 ms
Lead Channel Sensing Intrinsic Amplitude: 12 mV
Lead Channel Sensing Intrinsic Amplitude: 3.5 mV
Lead Channel Setting Pacing Amplitude: 2 V
Lead Channel Setting Pacing Amplitude: 2.5 V
Lead Channel Setting Pacing Pulse Width: 0.5 ms
Lead Channel Setting Sensing Sensitivity: 2 mV
Pulse Gen Model: 2240
Pulse Gen Serial Number: 7756161

## 2022-08-15 ENCOUNTER — Inpatient Hospital Stay: Payer: 59 | Attending: Hematology

## 2022-08-15 DIAGNOSIS — E785 Hyperlipidemia, unspecified: Secondary | ICD-10-CM | POA: Insufficient documentation

## 2022-08-15 DIAGNOSIS — Z5111 Encounter for antineoplastic chemotherapy: Secondary | ICD-10-CM | POA: Insufficient documentation

## 2022-08-15 DIAGNOSIS — Z8041 Family history of malignant neoplasm of ovary: Secondary | ICD-10-CM | POA: Insufficient documentation

## 2022-08-15 DIAGNOSIS — K7460 Unspecified cirrhosis of liver: Secondary | ICD-10-CM | POA: Insufficient documentation

## 2022-08-15 DIAGNOSIS — M8580 Other specified disorders of bone density and structure, unspecified site: Secondary | ICD-10-CM | POA: Insufficient documentation

## 2022-08-15 DIAGNOSIS — D649 Anemia, unspecified: Secondary | ICD-10-CM | POA: Insufficient documentation

## 2022-08-15 DIAGNOSIS — C778 Secondary and unspecified malignant neoplasm of lymph nodes of multiple regions: Secondary | ICD-10-CM | POA: Insufficient documentation

## 2022-08-15 DIAGNOSIS — Z87891 Personal history of nicotine dependence: Secondary | ICD-10-CM | POA: Insufficient documentation

## 2022-08-15 DIAGNOSIS — G4730 Sleep apnea, unspecified: Secondary | ICD-10-CM | POA: Insufficient documentation

## 2022-08-15 DIAGNOSIS — Z803 Family history of malignant neoplasm of breast: Secondary | ICD-10-CM | POA: Insufficient documentation

## 2022-08-15 DIAGNOSIS — Z808 Family history of malignant neoplasm of other organs or systems: Secondary | ICD-10-CM | POA: Insufficient documentation

## 2022-08-15 DIAGNOSIS — Z833 Family history of diabetes mellitus: Secondary | ICD-10-CM | POA: Insufficient documentation

## 2022-08-15 DIAGNOSIS — D696 Thrombocytopenia, unspecified: Secondary | ICD-10-CM | POA: Insufficient documentation

## 2022-08-15 DIAGNOSIS — Z8249 Family history of ischemic heart disease and other diseases of the circulatory system: Secondary | ICD-10-CM | POA: Insufficient documentation

## 2022-08-15 DIAGNOSIS — E079 Disorder of thyroid, unspecified: Secondary | ICD-10-CM | POA: Insufficient documentation

## 2022-08-15 DIAGNOSIS — Z79899 Other long term (current) drug therapy: Secondary | ICD-10-CM | POA: Insufficient documentation

## 2022-08-15 DIAGNOSIS — C61 Malignant neoplasm of prostate: Secondary | ICD-10-CM | POA: Insufficient documentation

## 2022-08-15 DIAGNOSIS — I2510 Atherosclerotic heart disease of native coronary artery without angina pectoris: Secondary | ICD-10-CM | POA: Insufficient documentation

## 2022-08-15 DIAGNOSIS — I509 Heart failure, unspecified: Secondary | ICD-10-CM | POA: Insufficient documentation

## 2022-08-15 DIAGNOSIS — Z801 Family history of malignant neoplasm of trachea, bronchus and lung: Secondary | ICD-10-CM | POA: Insufficient documentation

## 2022-08-15 DIAGNOSIS — I252 Old myocardial infarction: Secondary | ICD-10-CM | POA: Insufficient documentation

## 2022-08-15 DIAGNOSIS — I110 Hypertensive heart disease with heart failure: Secondary | ICD-10-CM | POA: Insufficient documentation

## 2022-08-15 DIAGNOSIS — Z832 Family history of diseases of the blood and blood-forming organs and certain disorders involving the immune mechanism: Secondary | ICD-10-CM | POA: Insufficient documentation

## 2022-08-15 DIAGNOSIS — E669 Obesity, unspecified: Secondary | ICD-10-CM | POA: Insufficient documentation

## 2022-08-16 ENCOUNTER — Other Ambulatory Visit (HOSPITAL_COMMUNITY): Payer: Self-pay

## 2022-08-17 ENCOUNTER — Ambulatory Visit (HOSPITAL_COMMUNITY): Payer: 59 | Attending: Physician Assistant

## 2022-08-17 DIAGNOSIS — R011 Cardiac murmur, unspecified: Secondary | ICD-10-CM | POA: Diagnosis present

## 2022-08-17 LAB — ECHOCARDIOGRAM COMPLETE
AR max vel: 1.47 cm2
AV Area VTI: 1.56 cm2
AV Area mean vel: 1.43 cm2
AV Mean grad: 15 mmHg
AV Peak grad: 26 mmHg
Ao pk vel: 2.55 m/s
Area-P 1/2: 2.16 cm2
S' Lateral: 2.1 cm

## 2022-08-17 MED ORDER — PERFLUTREN LIPID MICROSPHERE
1.0000 mL | INTRAVENOUS | Status: AC | PRN
  Administered 2022-08-17: 2 mL via INTRAVENOUS

## 2022-08-21 ENCOUNTER — Inpatient Hospital Stay: Payer: 59

## 2022-08-21 ENCOUNTER — Other Ambulatory Visit (HOSPITAL_COMMUNITY): Payer: Self-pay

## 2022-08-21 DIAGNOSIS — D696 Thrombocytopenia, unspecified: Secondary | ICD-10-CM | POA: Diagnosis not present

## 2022-08-21 DIAGNOSIS — Z79899 Other long term (current) drug therapy: Secondary | ICD-10-CM | POA: Diagnosis not present

## 2022-08-21 DIAGNOSIS — Z833 Family history of diabetes mellitus: Secondary | ICD-10-CM | POA: Diagnosis not present

## 2022-08-21 DIAGNOSIS — E669 Obesity, unspecified: Secondary | ICD-10-CM | POA: Diagnosis not present

## 2022-08-21 DIAGNOSIS — K7460 Unspecified cirrhosis of liver: Secondary | ICD-10-CM | POA: Diagnosis not present

## 2022-08-21 DIAGNOSIS — I509 Heart failure, unspecified: Secondary | ICD-10-CM | POA: Diagnosis not present

## 2022-08-21 DIAGNOSIS — C61 Malignant neoplasm of prostate: Secondary | ICD-10-CM | POA: Diagnosis present

## 2022-08-21 DIAGNOSIS — Z808 Family history of malignant neoplasm of other organs or systems: Secondary | ICD-10-CM | POA: Diagnosis not present

## 2022-08-21 DIAGNOSIS — Z803 Family history of malignant neoplasm of breast: Secondary | ICD-10-CM | POA: Diagnosis not present

## 2022-08-21 DIAGNOSIS — C778 Secondary and unspecified malignant neoplasm of lymph nodes of multiple regions: Secondary | ICD-10-CM | POA: Diagnosis not present

## 2022-08-21 DIAGNOSIS — Z832 Family history of diseases of the blood and blood-forming organs and certain disorders involving the immune mechanism: Secondary | ICD-10-CM | POA: Diagnosis not present

## 2022-08-21 DIAGNOSIS — D649 Anemia, unspecified: Secondary | ICD-10-CM

## 2022-08-21 DIAGNOSIS — E785 Hyperlipidemia, unspecified: Secondary | ICD-10-CM | POA: Diagnosis not present

## 2022-08-21 DIAGNOSIS — I2510 Atherosclerotic heart disease of native coronary artery without angina pectoris: Secondary | ICD-10-CM | POA: Diagnosis not present

## 2022-08-21 DIAGNOSIS — I252 Old myocardial infarction: Secondary | ICD-10-CM | POA: Diagnosis not present

## 2022-08-21 DIAGNOSIS — Z87891 Personal history of nicotine dependence: Secondary | ICD-10-CM | POA: Diagnosis not present

## 2022-08-21 DIAGNOSIS — E079 Disorder of thyroid, unspecified: Secondary | ICD-10-CM | POA: Diagnosis not present

## 2022-08-21 DIAGNOSIS — M8580 Other specified disorders of bone density and structure, unspecified site: Secondary | ICD-10-CM | POA: Diagnosis not present

## 2022-08-21 DIAGNOSIS — Z801 Family history of malignant neoplasm of trachea, bronchus and lung: Secondary | ICD-10-CM | POA: Diagnosis not present

## 2022-08-21 DIAGNOSIS — I110 Hypertensive heart disease with heart failure: Secondary | ICD-10-CM | POA: Diagnosis not present

## 2022-08-21 DIAGNOSIS — Z5111 Encounter for antineoplastic chemotherapy: Secondary | ICD-10-CM | POA: Diagnosis present

## 2022-08-21 DIAGNOSIS — Z8041 Family history of malignant neoplasm of ovary: Secondary | ICD-10-CM | POA: Diagnosis not present

## 2022-08-21 DIAGNOSIS — G4730 Sleep apnea, unspecified: Secondary | ICD-10-CM | POA: Diagnosis not present

## 2022-08-21 DIAGNOSIS — Z191 Hormone sensitive malignancy status: Secondary | ICD-10-CM

## 2022-08-21 DIAGNOSIS — Z8249 Family history of ischemic heart disease and other diseases of the circulatory system: Secondary | ICD-10-CM | POA: Diagnosis not present

## 2022-08-21 LAB — CBC WITH DIFFERENTIAL/PLATELET
Abs Immature Granulocytes: 0.02 10*3/uL (ref 0.00–0.07)
Basophils Absolute: 0.1 10*3/uL (ref 0.0–0.1)
Basophils Relative: 1 %
Eosinophils Absolute: 0.1 10*3/uL (ref 0.0–0.5)
Eosinophils Relative: 1 %
HCT: 35.9 % — ABNORMAL LOW (ref 39.0–52.0)
Hemoglobin: 11.8 g/dL — ABNORMAL LOW (ref 13.0–17.0)
Immature Granulocytes: 0 %
Lymphocytes Relative: 21 %
Lymphs Abs: 1.4 10*3/uL (ref 0.7–4.0)
MCH: 31.2 pg (ref 26.0–34.0)
MCHC: 32.9 g/dL (ref 30.0–36.0)
MCV: 95 fL (ref 80.0–100.0)
Monocytes Absolute: 0.7 10*3/uL (ref 0.1–1.0)
Monocytes Relative: 10 %
Neutro Abs: 4.5 10*3/uL (ref 1.7–7.7)
Neutrophils Relative %: 67 %
Platelets: 131 10*3/uL — ABNORMAL LOW (ref 150–400)
RBC: 3.78 MIL/uL — ABNORMAL LOW (ref 4.22–5.81)
RDW: 17.4 % — ABNORMAL HIGH (ref 11.5–15.5)
WBC: 6.7 10*3/uL (ref 4.0–10.5)
nRBC: 0 % (ref 0.0–0.2)

## 2022-08-21 LAB — COMPREHENSIVE METABOLIC PANEL
ALT: 9 U/L (ref 0–44)
AST: 19 U/L (ref 15–41)
Albumin: 3.5 g/dL (ref 3.5–5.0)
Alkaline Phosphatase: 91 U/L (ref 38–126)
Anion gap: 8 (ref 5–15)
BUN: 20 mg/dL (ref 8–23)
CO2: 19 mmol/L — ABNORMAL LOW (ref 22–32)
Calcium: 9.1 mg/dL (ref 8.9–10.3)
Chloride: 113 mmol/L — ABNORMAL HIGH (ref 98–111)
Creatinine, Ser: 1.31 mg/dL — ABNORMAL HIGH (ref 0.61–1.24)
GFR, Estimated: >60 mL/min (ref >60)
Glucose, Bld: 120 mg/dL — ABNORMAL HIGH (ref 70–99)
Potassium: 3.4 mmol/L — ABNORMAL LOW (ref 3.5–5.1)
Sodium: 140 mmol/L (ref 135–145)
Total Bilirubin: 0.6 mg/dL (ref 0.3–1.2)
Total Protein: 7.6 g/dL (ref 6.5–8.1)

## 2022-08-21 LAB — FERRITIN: Ferritin: 25 ng/mL (ref 24–336)

## 2022-08-21 LAB — IRON AND TIBC
Iron: 62 ug/dL (ref 45–182)
Saturation Ratios: 14 % — ABNORMAL LOW (ref 17.9–39.5)
TIBC: 451 ug/dL — ABNORMAL HIGH (ref 250–450)
UIBC: 389 ug/dL

## 2022-08-21 LAB — PSA: Prostatic Specific Antigen: <0.01 ng/mL (ref 0.00–4.00)

## 2022-08-21 NOTE — Progress Notes (Signed)
[facility] [address] [place] [address]    Clinic Day:  08/21/2022  Referring physician: Nolene Ebbs, MD  Patient Care Team: Nolene Ebbs, MD as PCP - General (Internal Medicine) [name], MD as PCP - Cardiology (Cardiology) [name], MD (Cardiology) Early, Arvilla Meres, MD as Attending Physician (Vascular Surgery) Cassandria Anger, MD as Consulting Physician (Endocrinology) [name], MD (Inactive) as Consulting Physician (Cardiology) Edythe Clarity, [facility] as Pharmacist (Pharmacist) Brien Mates, RN as Oncology Nurse Navigator (Oncology) Derek Jack, MD as Medical Oncologist (Medical Oncology)   ASSESSMENT & PLAN:   Assessment: 1.  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 -Status post IMRT, 40 sessions completed on 06/14/2016 by Dr. Tammi Klippel. -PSA 0.7 (10/15/2018), 0.8 (02/18/2019), 1.4 (December 2020), 1.4 (June 2021) -Prostate biopsy on 02/10/2020-1 out of 12 cores from left mid lateral region positive for adenocarcinoma. -CTAP on 03/09/2020 with newly enlarged left iliac lymph nodes measuring 1.1 x 0.9 cm.  Hepatic steatosis with early signs of cirrhosis. -F-18 PSMA PET scan on 04/12/2020 showed radiotracer activity associated with enlarged left external iliac lymph node 9 mm with SUV 7.6.  Small left common iliac lymph node 6 mm, SUV 8.6.  Left periaortic lymph node 5 mm, SUV 6.3.  Lymph node between IVC and aorta at the level of right renal hilum, SUV 8.6.  Lymph node deep to the IVC measuring 6 mm, SUV 5.4.  No skeletal metastasis. 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 - Firmagon started on 04/21/2020 - Apalutamide 240 milligrams daily started around 04/22/2020.   2.  Social/family history: -He is a retired Nature conservation officer.  He lives at home  with his wife.  He plays golf.  He quit smoking in 2015, smoked 1 pack/day for more than 20 years. -Sister has CML.  Maternal grandmother had breast cancer.  Maternal uncle had lung cancer.  2 maternal cousins had tongue cancer and uterine cancer.   3.  Cirrhosis: -Prior imaging showed cirrhosis of the liver with normal spleen.  Plan: 1.  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 - I have reviewed labs today which showed normal LFTs.  Creatinine has increased to 1.31.  CBC was grossly normal.  PSA is undetectable. - We will continue degarelix monthly as he had significant CAD with MI x 4. - Continue apalutamide 240 mg daily. - RTC 3 months for follow-up with repeat PSA, LFTs.   2.  Normocytic anemia: - Received Feraheme on 06/08/2022 on 06/15/2022.  Reported improvement in energy levels. - Ferritin is 25 still low.  Hemoglobin improved to 11.8. - Recommend Feraheme weekly x 2.   3.  Mild thrombocytopenia: - Mild thrombocytopenia on and off since December 2020 has been stable.  Platelet count today is 131.   4.  Osteopenia (DEXA 11/15/2021 T score -1.6): - He is taking vitamin D 50,000 units daily. - We have talked about starting him on Prolia to prevent worsening of bone mineral density while he is on antiandrogens. - He will have dental extractions done.  We will likely start at next visit in 3 months.  No orders of the defined types were placed in this encounter.     I,Alexis Herring,acting as a Education administrator for [employer], MD.,have documented all relevant documentation on the behalf of  Derek Jack, MD,as directed by  Derek Jack, MD while in the presence of Derek Jack, MD.   I, Derek Jack MD, have reviewed the above documentation for accuracy and completeness, and I agree with the above.   Alexis Herring   3/18/20249:52 PM  CHIEF COMPLAINT:    Diagnosis: metastatic castration sensitive prostate cancer   Cancer Staging  No matching staging information was found for the patient.   Prior Therapy: IMRT x 40 sessions completed on 06/14/2016   Current Therapy:  Firmagon every month; Erleada 240 mg daily    HISTORY OF PRESENT ILLNESS:   Oncology History  Prostate cancer ([place])  03/10/2016 Initial Diagnosis   Prostate cancer ([place])   05/23/2020 Genetic Testing   Negative genetic testing:  No pathogenic variants detected on the [facility] Common Hereditary Cancers Panel + Prostate Cancer HRR Panel. A variant of uncertain significance (VUS) was detected in the NF1 gene called c.1178A>G. The report date is 05/23/2020.  The Common Hereditary Cancers Panel offered by [facility] includes sequencing and/or deletion duplication testing of the following 47 genes: APC, ATM, AXIN2, BARD1, BMPR1A, BRCA1, BRCA2, BRIP1, CDH1, CDK4, CDKN2A (p14ARF), CDKN2A (p16INK4a), CHEK2, CTNNA1, DICER1, EPCAM (Deletion/duplication testing only), GREM1 (promoter region deletion/duplication testing only), KIT, MEN1, MLH1, MSH2, MSH3, MSH6, MUTYH, NBN, NF1, NTHL1, PALB2, PDGFRA, PMS2, POLD1, POLE, PTEN, RAD50, RAD51C, RAD51D, SDHB, SDHC, SDHD, SMAD4, SMARCA4. STK11, TP53, TSC1, TSC2, and VHL.  The following genes were evaluated for sequence changes only: SDHA and HOXB13 c.251G>A variant only. The Prostate Cancer HRR Panel offered by [facility] includes sequencing and/or deletion duplication analysis of the following 10 genes: ATM, BARD1, BRCA1, BRCA2, BRIP1, CHEK2, FANCL, PALB2, RAD51C, RAD51D.      INTERVAL HISTORY:   Peter Dunlap is a 67 y.o. male presenting to clinic today for follow up of metastatic castration sensitive prostate cancer. He was last seen by me on 05/30/22.  Today, he states that he is doing well overall. His appetite level is at 100%. His energy level is at 50%. He denies any new onset pains.   PAST MEDICAL HISTORY:   Past Medical History: Past  Medical History:  Diagnosis Date   Arteriosclerotic cardiovascular disease (ASCVD) 2005   catheterization in 10/2010:50% mid LAD, diffuse distal disease, circumflex irregularities, large dominant RCA with a 50% ostial, 70% distal, 60% posterolateral and 70% PDA; normal EF   Arthritis    Benign prostatic hypertrophy    Bilateral carpal tunnel syndrome 07/03/2018   Cerebrovascular disease 2010   R. carotid endarterectomy; Duplex in 10/2010-widely patent ICAs, subtotal left vertebral-not thought to be contributing to symptoms   Cervical spine disease    CT in 2012-advanced degeneration and spondylosis with moderate spinal stenosis at C3-C6   CHF (congestive heart failure) ([place])    Depression    Diabetes mellitus without complication ([place])    Erectile dysfunction    Family history of breast cancer    Family history of cancer of mouth    Family history of CML (chronic monocytic leukemia)    Family history of lung cancer    Family history of ovarian cancer    Family history of stomach cancer    Gastroesophageal reflux disease    H/O hiatal hernia    H/O: substance abuse ([place])    Cocaine, marijuana, alcohol.  Quit 2013.    Hyperlipidemia    Hypertension    Non-ST elevation myocardial infarction (NSTEMI), initial episode of care [facility]) 12/02/2013   DES LAD   Obesity    Prostate  cancer ([place])    Sleep apnea    CPAP   Tachy-brady syndrome ([place])    a. 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 25, 2010    CEA   COLONOSCOPY WITH PROPOFOL N/A 06/24/2021   Procedure: COLONOSCOPY WITH PROPOFOL;  Surgeon: Carol Ada, MD;  Location: [facility] ENDOSCOPY;  Service: Endoscopy;  Laterality: N/A;   CORONARY ANGIOPLASTY WITH STENT PLACEMENT  12/03/2013   LAD 90%-->0% W/ Promus Premier DES 3.0 mm x 16 mm, CFX OK, RCA 40%, EF 70-75%   LEFT ATRIAL APPENDAGE OCCLUSION N/A 08/05/2015   Procedure: LEFT ATRIAL APPENDAGE OCCLUSION;  Surgeon: [name], MD;  Location: [place] CV LAB;  Service: Cardiovascular;  Laterality: N/A;   LEFT HEART CATH AND CORONARY ANGIOGRAPHY N/A 09/13/2021   Procedure: LEFT HEART CATH AND CORONARY ANGIOGRAPHY;  Surgeon: Early Osmond, MD;  Location: [place] CV LAB;  Service: Cardiovascular;  Laterality: N/A;   LEFT HEART CATHETERIZATION WITH CORONARY ANGIOGRAM Left 12/03/2013   Procedure: LEFT HEART CATHETERIZATION WITH CORONARY ANGIOGRAM;  Surgeon: Leonie Man, MD;  Location: [facility] CATH LAB;  Service: Cardiovascular;  Laterality: Left;   LEFT HEART CATHETERIZATION WITH CORONARY ANGIOGRAM N/A 01/26/2014   Procedure: LEFT HEART CATHETERIZATION WITH CORONARY ANGIOGRAM;  Surgeon: Jettie Booze, MD;  Location: [facility] CATH LAB;  Service: Cardiovascular;  Laterality: N/A;   LEFT HEART CATHETERIZATION WITH CORONARY ANGIOGRAM N/A 08/03/2014   Procedure: LEFT HEART CATHETERIZATION WITH CORONARY ANGIOGRAM;  Surgeon: Burnell Blanks, MD;  Location: [facility] CATH LAB;  Service: Cardiovascular;  Laterality: N/A;   PERCUTANEOUS CORONARY STENT INTERVENTION (PCI-S)  12/03/2013   Procedure: PERCUTANEOUS CORONARY STENT INTERVENTION (PCI-S);  Surgeon: Leonie Man, MD;  Location: [facility] CATH LAB;  Service: Cardiovascular;;   PERMANENT PACEMAKER INSERTION N/A 09/18/2014   Procedure: PERMANENT PACEMAKER INSERTION;  Surgeon: [name], MD;  Location: [facility] CATH LAB;  Service: Cardiovascular;  Laterality: N/A;   POLYPECTOMY  06/24/2021   Procedure: POLYPECTOMY;  Surgeon: Carol Ada, MD;  Location: [facility] ENDOSCOPY;  Service: Endoscopy;;   RADIOFREQUENCY ABLATION  2005   for PSVT   TEE WITHOUT CARDIOVERSION N/A  07/27/2015   Procedure: TRANSESOPHAGEAL ECHOCARDIOGRAM (TEE);  Surgeon: Lelon Perla, MD;  Location: [facility] ENDOSCOPY;  Service: Cardiovascular;  Laterality: N/A;   TEE WITHOUT CARDIOVERSION N/A 09/15/2015   Procedure: TRANSESOPHAGEAL ECHOCARDIOGRAM (TEE);  Surgeon: Thayer Headings, MD;  Location: [facility] ENDOSCOPY;  Service: Cardiovascular;  Laterality: N/A;    Social History: Social History   Socioeconomic History   Marital status: Married    Spouse name: Not on file   Number of children: 0   Years of education: Not on file   Highest education level: Not on file  Occupational History   Occupation: Retired  Tobacco Use   Smoking status: Former    Packs/day: 1.00    Years: 40.00    Additional pack years: 0.00    Total pack years: 40.00    Types: Cigarettes    Start date: 10/20/1972    Quit date: 10/10/2012  Years since quitting: 9.8   Smokeless tobacco: Never   Tobacco comments:    Quit in May.   Vaping Use   Vaping Use: Never used  Substance and Sexual Activity   Alcohol use: Yes    Alcohol/week: 0.0 standard drinks of alcohol    Comment: former drinker-- sober since 2013.    Drug use: No    Types: Cocaine    Comment: quit cocaine 10/2011   Sexual activity: Yes    Partners: Female  Other Topics Concern   Not on file  Social History Narrative   Lives in [place].   Social Determinants of Health   Financial Resource Strain: Low Risk  (04/19/2020)   Overall Financial Resource Strain (CARDIA)    Difficulty of Paying Living Expenses: Not hard at all  Food Insecurity: No Food Insecurity (04/19/2020)   Hunger Vital Sign    Worried About Running Out of Food in the Last Year: Never true    Ran Out of Food in the Last Year: Never true  Transportation Needs: No Transportation Needs (04/19/2020)   PRAPARE - Hydrologist (Medical): No    Lack of Transportation (Non-Medical): No  Physical Activity: Inactive (04/19/2020)   Exercise Vital Sign     Days of Exercise per Week: 0 days    Minutes of Exercise per Session: 0 min  Stress: No Stress Concern Present (04/19/2020)   [place]    Feeling of Stress : Not at all  Social Connections: Moderately Isolated (04/19/2020)   Social Connection and Isolation Panel [NHANES]    Frequency of Communication with Friends and Family: More than three times a week    Frequency of Social Gatherings with Friends and Family: Once a week    Attends Religious Services: Never    Marine scientist or Organizations: No    Attends Archivist Meetings: Never    Marital Status: Married  Human resources officer Violence: Not At Risk (04/19/2020)   Humiliation, Afraid, Rape, and Kick questionnaire    Fear of Current or Ex-Partner: No    Emotionally Abused: No    Physically Abused: No    Sexually Abused: No    Family History: Family History  Problem Relation Age of Onset   Hypertension Mother        Cerebrovascular disease   Diabetes Mother    Coronary artery disease Father 6   Diabetes type II Father    Hypertension Father    Heart attack Father    Diabetes Brother    Hypertension Brother    Diabetes Sister    Hypertension Sister    Heart attack Sister 59   Leukemia Sister 85       CML   Breast cancer Maternal Grandmother        dx 40s   Lung cancer Maternal Uncle        dx >50, smoker   Cancer Cousin        mouth cancer, dx 74s, no smoking/chew tobacco hx (maternal 1st cousin)   Ovarian cancer Cousin        dx <50 (maternal 1st cousin)   Stomach cancer Cousin        dx 13s (maternal 1st cousin)   Cancer Cousin        type unknown to pt, dx >50 (paternal 1st cousin)    Current Medications:  Current Outpatient Medications:    allopurinol (ZYLOPRIM) 100 MG  tablet, TAKE 1 TABLET(100 MG) BY MOUTH DAILY, Disp: 90 tablet, Rfl: 0   apalutamide (ERLEADA) 60 MG tablet, TAKE 4 TABLETS (240 MG TOTAL) BY MOUTH DAILY. MAY  BE TAKEN WITH OR WITHOUT FOOD. SWALLOW TABLETS WHOLE., Disp: 120 tablet, Rfl: 3   aspirin EC 325 MG tablet, Take 1 tablet (325 mg total) daily by mouth., Disp: 30 tablet, Rfl: 0   Blood Glucose Monitoring Suppl (BLOOD GLUCOSE SYSTEM PAK) KIT, Use as directed to monitor FSBS 1x daily. Dx: E11.9., Disp: 1 kit, Rfl: 1   Cyanocobalamin (VITAMIN B 12) 500 MCG TABS, Take 500 mcg by mouth in the morning., Disp: , Rfl:    diclofenac Sodium (VOLTAREN) 1 % GEL, 2 g 2 (two) times daily., Disp: , Rfl:    FARXIGA 5 MG TABS tablet, Take 5 mg by mouth daily., Disp: , Rfl:    fluticasone (FLONASE) 50 MCG/ACT nasal spray, Place 2 sprays into both nostrils daily., Disp: 16 g, Rfl: 6   furosemide (LASIX) 20 MG tablet, TAKE 3 TABLETS BY MOUTH DAILY, Disp: 270 tablet, Rfl: 3   glipiZIDE (GLUCOTROL XL) 5 MG 24 hr tablet, Take 1 tablet (5 mg total) by mouth daily with breakfast., Disp: 90 tablet, Rfl: 3   glucose blood (ONETOUCH ULTRA) test strip, Use as instructed to monitor glucose twice daily., Disp: 100 strip, Rfl: 3   isosorbide mononitrate (IMDUR) 30 MG 24 hr tablet, Take 1 tablet (30 mg total) by mouth daily., Disp: 90 tablet, Rfl: 3   Lactulose 20 GM/30ML SOLN, Take 30 mLs (20 g total) by mouth daily as needed. Take 30 ml by mouth every 3 hours until you have bowel movement then daily as needed, Disp: 450 mL, Rfl: 3   Lancets (ONETOUCH DELICA PLUS Q000111Q) MISC, USE EVERY DAY FOR GLUCOSE TESTING, Disp: , Rfl:    levothyroxine (SYNTHROID) 200 MCG tablet, Take 1 tablet (200 mcg total) by mouth daily before breakfast., Disp: 90 tablet, Rfl: 3   levothyroxine (SYNTHROID) 25 MCG tablet, Take 1 tablet (25 mcg total) by mouth See admin instructions. Takes along with 200 mcg to total 225 mcg daily, Disp: 90 tablet, Rfl: 3   loratadine (CLARITIN) 10 MG tablet, TAKE 1 TABLET(10 MG) BY MOUTH DAILY AS NEEDED FOR ALLERGIES, Disp: 30 tablet, Rfl: 2   magnesium oxide (MAG-OX) 400 (240 Mg) MG tablet, TAKE 1 TABLET(400 MG) BY  MOUTH TWICE DAILY, Disp: 180 tablet, Rfl: 2   metFORMIN (GLUCOPHAGE) 500 MG tablet, Take 1 tablet (500 mg total) by mouth daily with breakfast., Disp: 90 tablet, Rfl: 3   naloxone (NARCAN) nasal spray 4 mg/0.1 mL, Place 4 mg into the nose once as needed (accidental overdose)., Disp: , Rfl:    nitroGLYCERIN (NITROSTAT) 0.4 MG SL tablet, PLACE 1 TABLET UNDER THE TONGUE EVERY 5 MINUTES AS NEEDED FOR CHEST PAIN. CALL 911 AT THIRD DOSE IN 15 MINUTES, Disp: 25 tablet, Rfl: 1   oxyCODONE-acetaminophen (PERCOCET) 10-325 MG tablet, Take 1 tablet by mouth every 8 (eight) hours as needed for pain., Disp: 90 tablet, Rfl: 0   pantoprazole (PROTONIX) 40 MG tablet, TAKE 1 TABLET(40 MG) BY MOUTH DAILY, Disp: 30 tablet, Rfl: 3   potassium chloride SA (KLOR-CON M) 20 MEQ tablet, Take 1 tablet (20 mEq total) by mouth daily., Disp: 30 tablet, Rfl: 3   rosuvastatin (CRESTOR) 40 MG tablet, Take 40 mg by mouth every evening., Disp: , Rfl:    silodosin (RAPAFLO) 8 MG CAPS capsule, TAKE 1 CAPSULE BY MOUTH TWICE DAILY,  Disp: 180 capsule, Rfl: 3   solifenacin (VESICARE) 10 MG tablet, TAKE 1 TABLET(10 MG) BY MOUTH DAILY, Disp: 30 tablet, Rfl: 11   sotalol (BETAPACE) 80 MG tablet, Take 1 tablet (80 mg total) by mouth 2 (two) times daily., Disp: 180 tablet, Rfl: 3   Vitamin D, Ergocalciferol, (DRISDOL) 1.25 MG (50000 UNIT) CAPS capsule, TAKE 1 CAPSULE BY MOUTH EVERY 7 DAYS, Disp: 12 capsule, Rfl: 0   Allergies: Allergies  Allergen Reactions   Trazodone And Nefazodone     Nightmares   Lactose Intolerance (Gi) Other (See Comments)    UPSET STOMACH     REVIEW OF SYSTEMS:   Review of Systems  Constitutional:  Negative for chills, fatigue and fever.  HENT:   Negative for lump/mass, mouth sores, nosebleeds, sore throat and trouble swallowing.   Eyes:  Negative for eye problems.  Respiratory:  Negative for cough and shortness of breath.   Cardiovascular:  Negative for chest pain, leg swelling and palpitations.   Gastrointestinal:  Negative for abdominal pain, constipation, diarrhea, nausea and vomiting.  Genitourinary:  Positive for frequency. Negative for bladder incontinence, difficulty urinating, dysuria, hematuria and nocturia.   Musculoskeletal:  Negative for arthralgias, back pain, flank pain, myalgias and neck pain.  Skin:  Negative for itching and rash.  Neurological:  Positive for headaches. Negative for dizziness and numbness.  Hematological:  Does not bruise/bleed easily.  Psychiatric/Behavioral:  Positive for sleep disturbance. Negative for depression and suicidal ideas. The patient is not nervous/anxious.   All other systems reviewed and are negative.    VITALS:   There were no vitals taken for this visit.  Wt Readings from Last 3 Encounters:  07/25/22 131.9 kg (290 lb 12.8 oz)  07/21/22 129.9 kg (286 lb 6.4 oz)  05/30/22 132.4 kg (291 lb 12.8 oz)    There is no height or weight on file to calculate BMI.  Performance status (ECOG): 1 - Symptomatic but completely ambulatory  PHYSICAL EXAM:   Physical Exam Vitals and nursing note reviewed. Exam conducted with a chaperone present.  Constitutional:      Appearance: Normal appearance.  Cardiovascular:     Rate and Rhythm: Normal rate and regular rhythm.     Pulses: Normal pulses.     Heart sounds: Normal heart sounds.  Pulmonary:     Effort: Pulmonary effort is normal.     Breath sounds: Normal breath sounds.  Abdominal:     Palpations: Abdomen is soft. There is no hepatomegaly, splenomegaly or mass.     Tenderness: There is no abdominal tenderness.  Musculoskeletal:     Right lower leg: No edema.     Left lower leg: No edema.  Lymphadenopathy:     Cervical: No cervical adenopathy.     Right cervical: No superficial, deep or posterior cervical adenopathy.    Left cervical: No superficial, deep or posterior cervical adenopathy.     Upper Body:     Right upper body: No supraclavicular or axillary adenopathy.     Left  upper body: No supraclavicular or axillary adenopathy.  Neurological:     General: No focal deficit present.     Mental Status: He is alert and oriented to person, place, and time.  Psychiatric:        Mood and Affect: Mood normal.        Behavior: Behavior normal.     LABS:      Latest Ref Rng & Units 08/21/2022   10:00 AM 05/25/2022  3:10 PM 02/28/2022   12:00 PM  CBC  WBC 4.0 - 10.5 K/uL 6.7  6.2  6.7   Hemoglobin 13.0 - 17.0 g/dL 11.8  10.1  11.2   Hematocrit 39.0 - 52.0 % 35.9  32.9  34.7   Platelets 150 - 400 K/uL 131  136  131       Latest Ref Rng & Units 08/21/2022   10:00 AM 07/21/2022   10:44 AM 05/25/2022    3:10 PM  CMP  Glucose 70 - 99 mg/dL 120  136  119   BUN 8 - 23 mg/dL 20  14  13    Creatinine 0.61 - 1.24 mg/dL 1.31  1.07  1.18   Sodium 135 - 145 mmol/L 140  142  138   Potassium 3.5 - 5.1 mmol/L 3.4  3.9  3.9   Chloride 98 - 111 mmol/L 113  107  111   CO2 22 - 32 mmol/L 19  21  21    Calcium 8.9 - 10.3 mg/dL 9.1  9.4  9.0   Total Protein 6.5 - 8.1 g/dL 7.6   7.5   Total Bilirubin 0.3 - 1.2 mg/dL 0.6   0.5   Alkaline Phos 38 - 126 U/L 91   77   AST 15 - 41 U/L 19   21   ALT 0 - 44 U/L 9   8      No results found for: "CEA1", "CEA" / No results found for: "CEA1", "CEA" No results found for: "PSA1" No results found for: "[id number]" No results found for: "CAN125"  No results found for: "TOTALPROTELP", "ALBUMINELP", "A1GS", "A2GS", "BETS", "BETA2SER", "GAMS", "MSPIKE", "SPEI" Lab Results  Component Value Date   TIBC 451 (H) 08/21/2022   TIBC 486 (H) 02/28/2022   TIBC 524 (H) 12/08/2021   FERRITIN 25 08/21/2022   FERRITIN 19 (L) 02/28/2022   FERRITIN 9 (L) 12/08/2021   IRONPCTSAT 14 (L) 08/21/2022   IRONPCTSAT 10 (L) 02/28/2022   IRONPCTSAT 6 (L) 12/08/2021   No results found for: "LDH"   STUDIES:   ECHOCARDIOGRAM COMPLETE  Result Date: 08/17/2022    ECHOCARDIOGRAM REPORT   Patient Name:   Peter Dunlap Date of Exam: 08/17/2022 Medical Rec  #:  [id number]         Height:       73.0 in Accession #:    [id number]        Weight:       290.8 lb Date of Birth:  02-12-1956         BSA:          2.523 m Patient Age:    76 years          BP:           110/56 mmHg Patient Gender: M                 HR:           60 bpm. Exam Location:  [place] Procedure: 2D Echo, 3D Echo, Cardiac Doppler, Color Doppler and Intracardiac            Opacification Agent Indications:    R01.1 Murmur  History:        Patient has prior history of Echocardiogram examinations, most                 recent 09/12/2021. CHF, CAD and Previous Myocardial Infarction,  Pacemaker, Arrythmias:Atrial Fibrillation; Risk Factors:Sleep                 Apnea, Former Smoker, Dyslipidemia, Diabetes, Hypertension and                 Family History of Coronary Artery Disease. Tachy-Brady Syndrome,                 Left Atrial Appendace Occluder (Watchman), History of Cocaine                 Abuse, Obesity.  Sonographer:    Deliah Boston RDCS Referring Phys: RENEE LYNN URSUY IMPRESSIONS  1. Mild basal intracavitary gradient. Peak velocity 1.35 m/s. Peak gradient 7.3 mmHg. Left ventricular ejection fraction, by estimation, is 65 to 70%. The left ventricle has normal function. The left ventricle has no regional wall motion abnormalities. There is severe left ventricular hypertrophy. Left ventricular diastolic parameters are consistent with Grade I diastolic dysfunction (impaired relaxation). Elevated left ventricular end-diastolic pressure.  2. Right ventricular systolic function is normal. The right ventricular size is normal. There is normal pulmonary artery systolic pressure.  3. Left atrial size was mildly dilated.  4. The mitral valve is normal in structure. Trivial mitral valve regurgitation. No evidence of mitral stenosis.  5. The aortic valve is tricuspid. There is mild calcification of the aortic valve. There is mild thickening of the aortic valve. Aortic valve regurgitation is  not visualized. Mild aortic valve stenosis.  6. The inferior vena cava is normal in size with greater than 50% respiratory variability, suggesting right atrial pressure of 3 mmHg. FINDINGS  Left Ventricle: Mild basal intracavitary gradient. Peak velocity 1.35 m/s. Peak gradient 7.3 mmHg. Left ventricular ejection fraction, by estimation, is 65 to 70%. The left ventricle has normal function. The left ventricle has no regional wall motion abnormalities. Definity contrast agent was given IV to delineate the left ventricular endocardial borders. The left ventricular internal cavity size was normal in size. There is severe left ventricular hypertrophy. Left ventricular diastolic parameters are consistent with Grade I diastolic dysfunction (impaired relaxation). Elevated left ventricular end-diastolic pressure. Right Ventricle: The right ventricular size is normal. No increase in right ventricular wall thickness. Right ventricular systolic function is normal. There is normal pulmonary artery systolic pressure. The tricuspid regurgitant velocity is 2.63 m/s, and  with an assumed right atrial pressure of 3 mmHg, the estimated right ventricular systolic pressure is [id number] mmHg. Left Atrium: Left atrial size was mildly dilated. Right Atrium: Right atrial size was normal in size. Pericardium: There is no evidence of pericardial effusion. Mitral Valve: The mitral valve is normal in structure. Trivial mitral valve regurgitation. No evidence of mitral valve stenosis. Tricuspid Valve: The tricuspid valve is normal in structure. Tricuspid valve regurgitation is mild . No evidence of tricuspid stenosis. Aortic Valve: The aortic valve is tricuspid. There is mild calcification of the aortic valve. There is mild thickening of the aortic valve. Aortic valve regurgitation is not visualized. Mild aortic stenosis is present. Aortic valve mean gradient measures  15.0 mmHg. Aortic valve peak gradient measures 26.0 mmHg. Aortic valve area, by  VTI measures 1.56 cm. Pulmonic Valve: The pulmonic valve was normal in structure. Pulmonic valve regurgitation is trivial. No evidence of pulmonic stenosis. Aorta: The aortic root is normal in size and structure. Venous: The inferior vena cava is normal in size with greater than 50% respiratory variability, suggesting right atrial pressure of 3 mmHg. IAS/Shunts: No atrial level shunt detected by color flow  Doppler. Additional Comments: A device lead is visualized.  LEFT VENTRICLE PLAX 2D LVIDd:         4.60 cm   Diastology LVIDs:         2.10 cm   LV e' medial:    4.35 cm/s LV PW:         1.20 cm   LV E/e' medial:  18.7 LV IVS:        2.00 cm   LV e' lateral:   4.03 cm/s LVOT diam:     2.10 cm   LV E/e' lateral: 20.2 LV SV:         90 LV SV Index:   35 LVOT Area:     3.46 cm                           3D Volume EF:                          3D EF:        60 %                          LV EDV:       263 ml                          LV ESV:       104 ml                          LV SV:        159 ml RIGHT VENTRICLE RV Basal diam:  3.40 cm RV S prime:     12.60 cm/s TAPSE (M-mode): 2.4 cm RVSP:           30.7 mmHg LEFT ATRIUM             Index        RIGHT ATRIUM           Index LA diam:        4.60 cm 1.82 cm/m   RA Pressure: 3.00 mmHg LA Vol (A2C):   76.8 ml 30.44 ml/m  RA Area:     12.50 cm LA Vol (A4C):   73.9 ml 29.29 ml/m  RA Volume:   29.10 ml  11.54 ml/m LA Biplane Vol: 78.3 ml 31.04 ml/m  AORTIC VALVE AV Area (Vmax):    1.47 cm AV Area (Vmean):   1.43 cm AV Area (VTI):     1.56 cm AV Vmax:           255.00 cm/s AV Vmean:          175.500 cm/s AV VTI:            0.574 m AV Peak Grad:      26.0 mmHg AV Mean Grad:      15.0 mmHg LVOT Vmax:         108.50 cm/s LVOT Vmean:        72.300 cm/s LVOT VTI:          0.258 m LVOT/AV VTI ratio: 0.45  AORTA Ao Root diam: 3.20 cm Ao Asc diam:  3.50 cm MITRAL VALVE               TRICUSPID VALVE MV Area (PHT): cm  TR Peak grad:   27.7 mmHg MV Decel Time: 351 msec     TR Vmax:        263.00 cm/s MV E velocity: 81.50 cm/s  Estimated RAP:  3.00 mmHg MV A velocity: 93.00 cm/s  RVSP:           30.7 mmHg MV E/A ratio:  0.88                            SHUNTS                            Systemic VTI:  0.26 m                            Systemic Diam: 2.10 cm Skeet Latch MD Electronically signed by Skeet Latch MD Signature Date/Time: 08/17/2022/1:16:04 PM    Final    CUP PACEART REMOTE DEVICE CHECK  Result Date: 08/09/2022 Scheduled remote reviewed. Normal device function.  Next remote 91 days. LA

## 2022-08-22 ENCOUNTER — Inpatient Hospital Stay (HOSPITAL_BASED_OUTPATIENT_CLINIC_OR_DEPARTMENT_OTHER): Payer: 59 | Admitting: Hematology

## 2022-08-22 ENCOUNTER — Inpatient Hospital Stay: Payer: 59

## 2022-08-22 VITALS — BP 134/76 | HR 63 | Temp 98.1°F | Resp 18 | Wt 289.4 lb

## 2022-08-22 DIAGNOSIS — D649 Anemia, unspecified: Secondary | ICD-10-CM | POA: Diagnosis not present

## 2022-08-22 DIAGNOSIS — C61 Malignant neoplasm of prostate: Secondary | ICD-10-CM

## 2022-08-22 DIAGNOSIS — Z191 Hormone sensitive malignancy status: Secondary | ICD-10-CM | POA: Diagnosis not present

## 2022-08-22 DIAGNOSIS — Z5111 Encounter for antineoplastic chemotherapy: Secondary | ICD-10-CM | POA: Diagnosis not present

## 2022-08-22 MED ORDER — DEGARELIX ACETATE 80 MG ~~LOC~~ SOLR
80.0000 mg | Freq: Once | SUBCUTANEOUS | Status: AC
  Administered 2022-08-22: 80 mg via SUBCUTANEOUS
  Filled 2022-08-22: qty 4

## 2022-08-22 NOTE — Progress Notes (Signed)
Firmagon injection given per orders. Patient tolerated it well without problems. Vitals stable and discharged home from clinic ambulatory. Follow up as scheduled.  

## 2022-08-22 NOTE — Progress Notes (Signed)
Patient has been assessed, vital signs and labs have been reviewed by Dr. Katragadda. ANC, Creatinine, LFTs, and Platelets are within treatment parameters per Dr. Katragadda. The patient is good to proceed with Firmagon treatment at this time.  Primary RN and pharmacy aware. ° °

## 2022-08-22 NOTE — Patient Instructions (Addendum)
Peter Dunlap  Discharge Instructions  You were seen and examined today by Dr. Delton Coombes.  Dr. Delton Coombes discussed your most recent lab work which revealed that everything looks good and stable but your iron is low.  Dr. Delton Coombes will get you scheduled for IV iron.  Continue getting your monthly injections and taking the Erleada as prescribed.  Follow-up as scheduled in 3 months.    Thank you for choosing [facility] to provide your oncology and hematology care.   To afford each patient quality time with our provider, please arrive at least 15 minutes before your scheduled appointment time. You may need to reschedule your appointment if you arrive late (10 or more minutes). Arriving late affects you and other patients whose appointments are after yours.  Also, if you miss three or more appointments without notifying the office, you may be dismissed from the clinic at the provider's discretion.    Again, thank you for choosing [facility].  Our hope is that these requests will decrease the amount of time that you wait before being seen by our physicians.   If you have a lab appointment with the [facility] please come in thru the Main Entrance and check in at the main information desk.           _____________________________________________________________  Should you have questions after your visit to [facility], please contact our office at [phone] and follow the prompts.  Our office hours are 8:00 a.m. to 4:30 p.m. Monday - Thursday and 8:00 a.m. to 2:30 p.m. Friday.  Please note that voicemails left after 4:00 p.m. may not be returned until the following business day.  We are closed weekends and all major holidays.  You do have access to a nurse 24-7, just call the main number to the clinic [phone] and do not press any options, hold on the line and a nurse will answer the phone.    For  prescription refill requests, have your pharmacy contact our office and allow 72 hours.    Masks are optional in the cancer centers. If you would like for your care team to wear a mask while they are taking care of you, please let them know. You may have one support person who is at least 67 years old accompany you for your appointments.

## 2022-08-25 ENCOUNTER — Inpatient Hospital Stay: Payer: 59

## 2022-08-25 ENCOUNTER — Other Ambulatory Visit: Payer: Self-pay

## 2022-08-25 VITALS — BP 101/61 | HR 59 | Temp 97.8°F | Resp 16

## 2022-08-25 DIAGNOSIS — C61 Malignant neoplasm of prostate: Secondary | ICD-10-CM

## 2022-08-25 DIAGNOSIS — Z5111 Encounter for antineoplastic chemotherapy: Secondary | ICD-10-CM | POA: Diagnosis not present

## 2022-08-25 MED ORDER — SODIUM CHLORIDE 0.9 % IV SOLN
510.0000 mg | Freq: Once | INTRAVENOUS | Status: AC
  Administered 2022-08-25: 510 mg via INTRAVENOUS
  Filled 2022-08-25: qty 510

## 2022-08-25 MED ORDER — CETIRIZINE HCL 10 MG PO TABS: 10.0000 mg | ORAL_TABLET | Freq: Once | ORAL | Status: DC

## 2022-08-25 MED ORDER — SODIUM CHLORIDE 0.9 % IV SOLN: Freq: Once | INTRAVENOUS | Status: AC

## 2022-08-25 MED ORDER — ACETAMINOPHEN 325 MG PO TABS
650.0000 mg | ORAL_TABLET | Freq: Once | ORAL | Status: AC
  Administered 2022-08-25: 650 mg via ORAL
  Filled 2022-08-25: qty 2

## 2022-08-25 NOTE — Patient Instructions (Signed)
[facility]  Discharge Instructions: Thank you for choosing [facility] to provide your oncology and hematology care.  If you have a lab appointment with the Cancer Center, please come in thru the Main Entrance and check in at the main information desk.  Wear comfortable clothing and clothing appropriate for easy access to any Portacath or PICC line.   We strive to give you quality time with your provider. You may need to reschedule your appointment if you arrive late (15 or more minutes).  Arriving late affects you and other patients whose appointments are after yours.  Also, if you miss three or more appointments without notifying the office, you may be dismissed from the clinic at the provider's discretion.      For prescription refill requests, have your pharmacy contact our office and allow 72 hours for refills to be completed.    Today you received the following iron infusion, feraheme   To help prevent nausea and vomiting after your treatment, we encourage you to take your nausea medication as directed.  BELOW ARE SYMPTOMS THAT SHOULD BE REPORTED IMMEDIATELY: *FEVER GREATER THAN 100.4 F (38 C) OR HIGHER *CHILLS OR SWEATING *NAUSEA AND VOMITING THAT IS NOT CONTROLLED WITH YOUR NAUSEA MEDICATION *UNUSUAL SHORTNESS OF BREATH *UNUSUAL BRUISING OR BLEEDING *URINARY PROBLEMS (pain or burning when urinating, or frequent urination) *BOWEL PROBLEMS (unusual diarrhea, constipation, pain near the anus) TENDERNESS IN MOUTH AND THROAT WITH OR WITHOUT PRESENCE OF ULCERS (sore throat, sores in mouth, or a toothache) UNUSUAL RASH, SWELLING OR PAIN  UNUSUAL VAGINAL DISCHARGE OR ITCHING   Items with * indicate a potential emergency and should be followed up as soon as possible or go to the Emergency Department if any problems should occur.  Please show the CHEMOTHERAPY ALERT CARD or IMMUNOTHERAPY ALERT CARD at check-in to the Emergency Department and triage  nurse.  Should you have questions after your visit or need to cancel or reschedule your appointment, please contact [facility] [phone]  and follow the prompts.  Office hours are 8:00 a.m. to 4:30 p.m. Monday - Friday. Please note that voicemails left after 4:00 p.m. may not be returned until the following business day.  We are closed weekends and major holidays. You have access to a nurse at all times for urgent questions. Please call the main number to the clinic [phone] and follow the prompts.  For any non-urgent questions, you may also contact your provider using MyChart. We now offer e-Visits for anyone 18 and older to request care online for non-urgent symptoms. For details visit [email or web address].   Also download the MyChart app! Go to the app store, search "MyChart", open the app, select [place], and log in with your MyChart username and password.   

## 2022-08-31 ENCOUNTER — Other Ambulatory Visit: Payer: Self-pay | Admitting: *Deleted

## 2022-08-31 DIAGNOSIS — I6523 Occlusion and stenosis of bilateral carotid arteries: Secondary | ICD-10-CM

## 2022-09-01 ENCOUNTER — Inpatient Hospital Stay: Payer: 59

## 2022-09-01 VITALS — BP 117/61 | HR 65 | Temp 97.6°F | Resp 18

## 2022-09-01 DIAGNOSIS — Z191 Hormone sensitive malignancy status: Secondary | ICD-10-CM

## 2022-09-01 DIAGNOSIS — Z5111 Encounter for antineoplastic chemotherapy: Secondary | ICD-10-CM | POA: Diagnosis not present

## 2022-09-01 MED ORDER — ACETAMINOPHEN 325 MG PO TABS
650.0000 mg | ORAL_TABLET | Freq: Once | ORAL | Status: AC
  Administered 2022-09-01: 650 mg via ORAL
  Filled 2022-09-01: qty 2

## 2022-09-01 MED ORDER — SODIUM CHLORIDE 0.9 % IV SOLN: Freq: Once | INTRAVENOUS | Status: AC

## 2022-09-01 MED ORDER — CETIRIZINE HCL 10 MG PO TABS
10.0000 mg | ORAL_TABLET | Freq: Once | ORAL | Status: AC
  Administered 2022-09-01: 10 mg via ORAL
  Filled 2022-09-01: qty 1

## 2022-09-01 MED ORDER — SODIUM CHLORIDE 0.9 % IV SOLN
510.0000 mg | Freq: Once | INTRAVENOUS | Status: AC
  Administered 2022-09-01: 510 mg via INTRAVENOUS
  Filled 2022-09-01: qty 510

## 2022-09-01 NOTE — Patient Instructions (Signed)
[facility] [place]  Discharge Instructions: Thank you for choosing [facility] to provide your oncology and hematology care.  If you have a lab appointment with the Cancer Center, please come in thru the Main Entrance and check in at the main information desk.  Wear comfortable clothing and clothing appropriate for easy access to any Portacath or PICC line.   We strive to give you quality time with your provider. You may need to reschedule your appointment if you arrive late (15 or more minutes).  Arriving late affects you and other patients whose appointments are after yours.  Also, if you miss three or more appointments without notifying the office, you may be dismissed from the clinic at the provider's discretion.      For prescription refill requests, have your pharmacy contact our office and allow 72 hours for refills to be completed.    Ferumoxytol Injection What is this medication? FERUMOXYTOL (FER ue MOX i tol) treats low levels of iron in your body (iron deficiency anemia). Iron is a mineral that plays an important role in making red blood cells, which carry oxygen from your lungs to the rest of your body. This medicine may be used for other purposes; ask your health care provider or pharmacist if you have questions. COMMON BRAND NAME(S): Feraheme What should I tell my care team before I take this medication? They need to know if you have any of these conditions: Anemia not caused by low iron levels High levels of iron in the blood Magnetic resonance imaging (MRI) test scheduled An unusual or allergic reaction to iron, other medications, foods, dyes, or preservatives Pregnant or trying to get pregnant Breastfeeding How should I use this medication? This medication is injected into a vein. It is given by your care team in a hospital or clinic setting. Talk to your care team the use of this medication in children. Special care may be needed. Overdosage:  If you think you have taken too much of this medicine contact a poison control center or emergency room at once. NOTE: This medicine is only for you. Do not share this medicine with others. What if I miss a dose? It is important not to miss your dose. Call your care team if you are unable to keep an appointment. What may interact with this medication? Other iron products This list may not describe all possible interactions. Give your health care provider a list of all the medicines, herbs, non-prescription drugs, or dietary supplements you use. Also tell them if you smoke, drink alcohol, or use illegal drugs. Some items may interact with your medicine. What should I watch for while using this medication? Visit your care team regularly. Tell your care team if your symptoms do not start to get better or if they get worse. You may need blood work done while you are taking this medication. You may need to follow a special diet. Talk to your care team. Foods that contain iron include: whole grains/cereals, dried fruits, beans, or peas, leafy green vegetables, and organ meats (liver, kidney). What side effects may I notice from receiving this medication? Side effects that you should report to your care team as soon as possible: Allergic reactions--skin rash, itching, hives, swelling of the face, lips, tongue, or throat Low blood pressure--dizziness, feeling faint or lightheaded, blurry vision Shortness of breath Side effects that usually do not require medical attention (report to your care team if they continue or are bothersome): Flushing Headache   Joint pain Muscle pain Nausea Pain, redness, or irritation at injection site This list may not describe all possible side effects. Call your doctor for medical advice about side effects. You may report side effects to FDA at 1-800-FDA-1088. Where should I keep my medication? This medication is given in a hospital or clinic and will not be stored at  home. NOTE: This sheet is a summary. It may not cover all possible information. If you have questions about this medicine, talk to your doctor, pharmacist, or health care provider.  2023 Elsevier/Gold Standard (2020-10-13 00:00:00)    To help prevent nausea and vomiting after your treatment, we encourage you to take your nausea medication as directed.  BELOW ARE SYMPTOMS THAT SHOULD BE REPORTED IMMEDIATELY: *FEVER GREATER THAN 100.4 F (38 C) OR HIGHER *CHILLS OR SWEATING *NAUSEA AND VOMITING THAT IS NOT CONTROLLED WITH YOUR NAUSEA MEDICATION *UNUSUAL SHORTNESS OF BREATH *UNUSUAL BRUISING OR BLEEDING *URINARY PROBLEMS (pain or burning when urinating, or frequent urination) *BOWEL PROBLEMS (unusual diarrhea, constipation, pain near the anus) TENDERNESS IN MOUTH AND THROAT WITH OR WITHOUT PRESENCE OF ULCERS (sore throat, sores in mouth, or a toothache) UNUSUAL RASH, SWELLING OR PAIN  UNUSUAL VAGINAL DISCHARGE OR ITCHING   Items with * indicate a potential emergency and should be followed up as soon as possible or go to the Emergency Department if any problems should occur.  Please show the CHEMOTHERAPY ALERT CARD or IMMUNOTHERAPY ALERT CARD at check-in to the Emergency Department and triage nurse.  Should you have questions after your visit or need to cancel or reschedule your appointment, please contact [facility] [phone]  and follow the prompts.  Office hours are 8:00 a.m. to 4:30 p.m. Monday - Friday. Please note that voicemails left after 4:00 p.m. may not be returned until the following business day.  We are closed weekends and major holidays. You have access to a nurse at all times for urgent questions. Please call the main number to the clinic [phone] and follow the prompts.  For any non-urgent questions, you may also contact your provider using MyChart. We now offer e-Visits for anyone 18 and older to request care online for non-urgent symptoms. For  details visit [email or web address].   Also download the MyChart app! Go to the app store, search "MyChart", open the app, select [place], and log in with your MyChart username and password.   

## 2022-09-01 NOTE — Progress Notes (Signed)
Patient presents today for iron infusion.  Patient is in satisfactory condition with no new complaints voiced.  Vital signs are stable.  IV placed in right arm.  IV flushed well with good blood return noted.  We will proceed with infusion per provider orders.    Patient tolerated iron infusion well with no complaints voiced.  Patient left ambulatory in stable condition.  Vital signs stable at discharge.  Follow up as scheduled.

## 2022-09-07 ENCOUNTER — Other Ambulatory Visit: Payer: Self-pay

## 2022-09-07 MED ORDER — NITROGLYCERIN 0.4 MG SL SUBL: SUBLINGUAL_TABLET | SUBLINGUAL | 11 refills | Status: DC

## 2022-09-12 ENCOUNTER — Ambulatory Visit (HOSPITAL_COMMUNITY)
Admission: RE | Admit: 2022-09-12 | Discharge: 2022-09-12 | Disposition: A | Discharge status: Home / Self Care | Payer: 59 | Source: Ambulatory Visit | Attending: Vascular Surgery | Admitting: Vascular Surgery

## 2022-09-12 ENCOUNTER — Ambulatory Visit (INDEPENDENT_AMBULATORY_CARE_PROVIDER_SITE_OTHER): Payer: 59 | Admitting: Physician Assistant

## 2022-09-12 VITALS — BP 92/63 | HR 66 | Temp 97.1°F | Resp 18 | Ht 70.5 in | Wt 287.9 lb

## 2022-09-12 DIAGNOSIS — I6523 Occlusion and stenosis of bilateral carotid arteries: Secondary | ICD-10-CM

## 2022-09-12 NOTE — Progress Notes (Signed)
Established Carotid Patient   History of Present Illness   Peter Dunlap is a 67 y.o. ([birth year]) male who presents for surveillance of carotid artery stenosis.  He has a history of right carotid endarterectomy in February 2010 by Dr. Arbie CookeyEarly.  He has no CVA/TIA history.  He returns today for follow-up.  He denies any recent CVA or TIA diagnosis.  He denies any strokelike symptoms such as sudden weakness/numbness, sudden changes to vision, slurred speech, dizziness, or facial droop.  He is still taking his aspirin and statin.  Former smoker, quit in 2014.  Current Outpatient Medications  Medication Sig Dispense Refill   allopurinol (ZYLOPRIM) 100 MG tablet TAKE 1 TABLET(100 MG) BY MOUTH DAILY 90 tablet 0   apalutamide (ERLEADA) 60 MG tablet TAKE 4 TABLETS (240 MG TOTAL) BY MOUTH DAILY. MAY BE TAKEN WITH OR WITHOUT FOOD. SWALLOW TABLETS WHOLE. 120 tablet 3   aspirin EC 325 MG tablet Take 1 tablet (325 mg total) daily by mouth. 30 tablet 0   Blood Glucose Monitoring Suppl (BLOOD GLUCOSE SYSTEM PAK) KIT Use as directed to monitor FSBS 1x daily. Dx: E11.9. 1 kit 1   Cyanocobalamin (VITAMIN B 12) 500 MCG TABS Take 500 mcg by mouth in the morning.     diclofenac Sodium (VOLTAREN) 1 % GEL 2 g 2 (two) times daily.     FARXIGA 5 MG TABS tablet Take 5 mg by mouth daily.     fluticasone (FLONASE) 50 MCG/ACT nasal spray Place 2 sprays into both nostrils daily. 16 g 6   furosemide (LASIX) 20 MG tablet TAKE 3 TABLETS BY MOUTH DAILY 270 tablet 3   glipiZIDE (GLUCOTROL XL) 5 MG 24 hr tablet Take 1 tablet (5 mg total) by mouth daily with breakfast. 90 tablet 3   glucose blood (ONETOUCH ULTRA) test strip Use as instructed to monitor glucose twice daily. 100 strip 3   isosorbide mononitrate (IMDUR) 30 MG 24 hr tablet Take 1 tablet (30 mg total) by mouth daily. 90 tablet 3   Lactulose 20 GM/30ML SOLN Take 30 mLs (20 g total) by mouth daily as needed. Take 30 ml by mouth every 3 hours until you have  bowel movement then daily as needed 450 mL 3   Lancets (ONETOUCH DELICA PLUS LANCET30G) MISC USE EVERY DAY FOR GLUCOSE TESTING     levothyroxine (SYNTHROID) 200 MCG tablet Take 1 tablet (200 mcg total) by mouth daily before breakfast. 90 tablet 3   levothyroxine (SYNTHROID) 25 MCG tablet Take 1 tablet (25 mcg total) by mouth See admin instructions. Takes along with 200 mcg to total 225 mcg daily 90 tablet 3   loratadine (CLARITIN) 10 MG tablet TAKE 1 TABLET(10 MG) BY MOUTH DAILY AS NEEDED FOR ALLERGIES 30 tablet 2   magnesium oxide (MAG-OX) 400 (240 Mg) MG tablet TAKE 1 TABLET(400 MG) BY MOUTH TWICE DAILY 180 tablet 2   metFORMIN (GLUCOPHAGE) 500 MG tablet Take 1 tablet (500 mg total) by mouth daily with breakfast. 90 tablet 3   naloxone (NARCAN) nasal spray 4 mg/0.1 mL Place 4 mg into the nose once as needed (accidental overdose).     nitroGLYCERIN (NITROSTAT) 0.4 MG SL tablet PLACE 1 TABLET UNDER THE TONGUE EVERY 5 MINUTES AS NEEDED FOR CHEST PAIN. CALL 911 AT THIRD DOSE IN 15 MINUTES 25 tablet 11   oxyCODONE-acetaminophen (PERCOCET) 10-325 MG tablet Take 1 tablet by mouth every 8 (eight) hours as needed for pain. 90 tablet 0   pantoprazole (PROTONIX) 40  MG tablet TAKE 1 TABLET(40 MG) BY MOUTH DAILY 30 tablet 3   potassium chloride SA (KLOR-CON M) 20 MEQ tablet Take 1 tablet (20 mEq total) by mouth daily. 30 tablet 3   rosuvastatin (CRESTOR) 40 MG tablet Take 40 mg by mouth every evening.     silodosin (RAPAFLO) 8 MG CAPS capsule TAKE 1 CAPSULE BY MOUTH TWICE DAILY 180 capsule 3   solifenacin (VESICARE) 10 MG tablet TAKE 1 TABLET(10 MG) BY MOUTH DAILY 30 tablet 11   sotalol (BETAPACE) 80 MG tablet Take 1 tablet (80 mg total) by mouth 2 (two) times daily. 180 tablet 3   Vitamin D, Ergocalciferol, (DRISDOL) 1.25 MG (50000 UNIT) CAPS capsule TAKE 1 CAPSULE BY MOUTH EVERY 7 DAYS 12 capsule 0   No current facility-administered medications for this visit.    REVIEW OF SYSTEMS (negative unless  checked):   Cardiac:  []  Chest pain or chest pressure? []  Shortness of breath upon activity? []  Shortness of breath when lying flat? []  Irregular heart rhythm?  Vascular:  []  Pain in calf, thigh, or hip brought on by walking? []  Pain in feet at night that wakes you up from your sleep? []  Blood clot in your veins? []  Leg swelling?  Pulmonary:  []  Oxygen at home? []  Productive cough? []  Wheezing?  Neurologic:  []  Sudden weakness in arms or legs? []  Sudden numbness in arms or legs? []  Sudden onset of difficult speaking or slurred speech? []  Temporary loss of vision in one eye? []  Problems with dizziness?  Gastrointestinal:  []  Blood in stool? []  Vomited blood?  Genitourinary:  []  Burning when urinating? []  Blood in urine?  Psychiatric:  []  Major depression  Hematologic:  []  Bleeding problems? []  Problems with blood clotting?  Dermatologic:  []  Rashes or ulcers?  Constitutional:  []  Fever or chills?  Ear/Nose/Throat:  []  Change in hearing? []  Nose bleeds? []  Sore throat?  Musculoskeletal:  []  Back pain? []  Joint pain? []  Muscle pain?   Physical Examination   Vitals:   09/12/22 1259 09/12/22 1303  BP: 109/65 92/63  Pulse: 66   Resp: 18   Temp: (!) 97.1 F (36.2 C)   TempSrc: Temporal   SpO2: 100%   Weight: 287 lb 14.4 oz (130.6 kg)   Height: 5' 10.5" (1.791 m)    Body mass index is 40.73 kg/m.  General:  WDWN in NAD; vital signs documented above Gait: Not observed HENT: WNL, normocephalic Pulmonary: normal non-labored breathing  Cardiac: regular, no carotid bruit Abdomen: soft, NT, no masses Skin: without rashes Vascular Exam/Pulses: 2+ radial pulses bilaterally Extremities: without ischemic changes, without Gangrene , without cellulitis; without open wounds;  Musculoskeletal: no muscle wasting or atrophy  Neurologic: A&O X 3;  No focal weakness or paresthesias are detected Psychiatric:  The pt has Normal affect.  Non-Invasive  Vascular Imaging   B Carotid Duplex (09/12/2022):  R ICA stenosis:  1-39% R VA:  patent and antegrade L ICA stenosis:  1-39% L VA:  patent and antegrade   Medical Decision Making   Peter Dunlap is a 67 y.o. male who presents for surveillance of carotid artery stenosis  Based on the patient's vascular studies, his bilateral ICA stenosis is unchanged at 1-39%. He has a patent right carotid endarterectomy He denies any recent CVA or TIA diagnosis.  He denies any stroke symptoms such as sudden weakness/numbness, sudden changes in vision, slurred speech, facial droop. On exam he has palpable radial pulses 2+. He has no carotid  bruits on exam He can continue his aspirin and statin. He can follow up with our office in another 18 months with carotid duplex    Loel Dubonnet PA-C [facility] Office: [phone]  Clinic MD: Steve Rattler

## 2022-09-13 NOTE — Progress Notes (Signed)
Remote pacemaker transmission.   

## 2022-09-20 ENCOUNTER — Other Ambulatory Visit (HOSPITAL_COMMUNITY): Payer: Self-pay

## 2022-09-22 ENCOUNTER — Inpatient Hospital Stay: Payer: 59 | Attending: Hematology

## 2022-09-22 VITALS — BP 114/66 | HR 66 | Temp 97.5°F | Resp 18

## 2022-09-22 DIAGNOSIS — M8580 Other specified disorders of bone density and structure, unspecified site: Secondary | ICD-10-CM | POA: Insufficient documentation

## 2022-09-22 DIAGNOSIS — Z191 Hormone sensitive malignancy status: Secondary | ICD-10-CM

## 2022-09-22 DIAGNOSIS — C778 Secondary and unspecified malignant neoplasm of lymph nodes of multiple regions: Secondary | ICD-10-CM | POA: Insufficient documentation

## 2022-09-22 DIAGNOSIS — D696 Thrombocytopenia, unspecified: Secondary | ICD-10-CM | POA: Insufficient documentation

## 2022-09-22 DIAGNOSIS — D649 Anemia, unspecified: Secondary | ICD-10-CM | POA: Insufficient documentation

## 2022-09-22 DIAGNOSIS — Z79899 Other long term (current) drug therapy: Secondary | ICD-10-CM | POA: Insufficient documentation

## 2022-09-22 DIAGNOSIS — Z5111 Encounter for antineoplastic chemotherapy: Secondary | ICD-10-CM | POA: Insufficient documentation

## 2022-09-22 DIAGNOSIS — C61 Malignant neoplasm of prostate: Secondary | ICD-10-CM | POA: Diagnosis present

## 2022-09-22 MED ORDER — DEGARELIX ACETATE 80 MG ~~LOC~~ SOLR
80.0000 mg | Freq: Once | SUBCUTANEOUS | Status: AC
  Administered 2022-09-22: 80 mg via SUBCUTANEOUS
  Filled 2022-09-22: qty 4

## 2022-09-22 NOTE — Patient Instructions (Signed)
[facility]  Discharge Instructions: Thank you for choosing [facility] to provide your oncology and hematology care.  If you have a lab appointment with the Cancer Center - please note that after April 8th, 2024, all labs will be drawn in the cancer center.  You do not have to check in or register with the main entrance as you have in the past but will complete your check-in in the cancer center.  Wear comfortable clothing and clothing appropriate for easy access to any Portacath or PICC line.   We strive to give you quality time with your provider. You may need to reschedule your appointment if you arrive late (15 or more minutes).  Arriving late affects you and other patients whose appointments are after yours.  Also, if you miss three or more appointments without notifying the office, you may be dismissed from the clinic at the provider's discretion.      For prescription refill requests, have your pharmacy contact our office and allow 72 hours for refills to be completed.    Today you received the following [name], return as scheduled.   To help prevent nausea and vomiting after your treatment, we encourage you to take your nausea medication as directed.  BELOW ARE SYMPTOMS THAT SHOULD BE REPORTED IMMEDIATELY: *FEVER GREATER THAN 100.4 F (38 C) OR HIGHER *CHILLS OR SWEATING *NAUSEA AND VOMITING THAT IS NOT CONTROLLED WITH YOUR NAUSEA MEDICATION *UNUSUAL SHORTNESS OF BREATH *UNUSUAL BRUISING OR BLEEDING *URINARY PROBLEMS (pain or burning when urinating, or frequent urination) *BOWEL PROBLEMS (unusual diarrhea, constipation, pain near the anus) TENDERNESS IN MOUTH AND THROAT WITH OR WITHOUT PRESENCE OF ULCERS (sore throat, sores in mouth, or a toothache) UNUSUAL RASH, SWELLING OR PAIN  UNUSUAL VAGINAL DISCHARGE OR ITCHING   Items with * indicate a potential emergency and should be followed up as soon as possible or go to the Emergency Department if  any problems should occur.  Please show the CHEMOTHERAPY ALERT CARD or IMMUNOTHERAPY ALERT CARD at check-in to the Emergency Department and triage nurse.  Should you have questions after your visit or need to cancel or reschedule your appointment, please contact [facility] AT [facility] [phone]  and follow the prompts.  Office hours are 8:00 a.m. to 4:30 p.m. Monday - Friday. Please note that voicemails left after 4:00 p.m. may not be returned until the following business day.  We are closed weekends and major holidays. You have access to a nurse at all times for urgent questions. Please call the main number to the clinic [phone] and follow the prompts.  For any non-urgent questions, you may also contact your provider using MyChart. We now offer e-Visits for anyone 77 and older to request care online for non-urgent symptoms. For details visit [email or web address].   Also download the MyChart app! Go to the app store, search "MyChart", open the app, select [place], and log in with your MyChart username and password.

## 2022-09-22 NOTE — Progress Notes (Signed)
Patient tolerated injection with no complaints voiced. Site clean and dry with no bruising or swelling noted at site. See MAR for details. Band aid applied.  Patient stable during and after injection. VSS with discharge and left in satisfactory condition with no s/s of distress noted.  

## 2022-09-25 ENCOUNTER — Other Ambulatory Visit: Payer: Self-pay | Admitting: Nurse Practitioner

## 2022-09-25 DIAGNOSIS — E890 Postprocedural hypothyroidism: Secondary | ICD-10-CM

## 2022-09-26 ENCOUNTER — Other Ambulatory Visit (HOSPITAL_COMMUNITY): Payer: Self-pay

## 2022-10-10 ENCOUNTER — Other Ambulatory Visit: Payer: Self-pay | Admitting: Nurse Practitioner

## 2022-10-10 DIAGNOSIS — E1169 Type 2 diabetes mellitus with other specified complication: Secondary | ICD-10-CM

## 2022-10-20 ENCOUNTER — Inpatient Hospital Stay: Payer: 59 | Attending: Hematology

## 2022-10-20 VITALS — BP 129/49 | HR 64 | Temp 97.3°F | Resp 18 | Ht 71.0 in | Wt 290.0 lb

## 2022-10-20 DIAGNOSIS — Z5111 Encounter for antineoplastic chemotherapy: Secondary | ICD-10-CM | POA: Diagnosis present

## 2022-10-20 DIAGNOSIS — Z79899 Other long term (current) drug therapy: Secondary | ICD-10-CM | POA: Diagnosis not present

## 2022-10-20 DIAGNOSIS — C61 Malignant neoplasm of prostate: Secondary | ICD-10-CM | POA: Diagnosis present

## 2022-10-20 DIAGNOSIS — Z191 Hormone sensitive malignancy status: Secondary | ICD-10-CM

## 2022-10-20 MED ORDER — DEGARELIX ACETATE 80 MG ~~LOC~~ SOLR
80.0000 mg | Freq: Once | SUBCUTANEOUS | Status: AC
  Administered 2022-10-20: 80 mg via SUBCUTANEOUS
  Filled 2022-10-20: qty 4

## 2022-10-20 NOTE — Patient Instructions (Signed)
[facility]  Discharge Instructions: Thank you for choosing [facility] to provide your oncology and hematology care.  If you have a lab appointment with the Cancer Center - please note that after April 8th, 2024, all labs will be drawn in the cancer center.  You do not have to check in or register with the main entrance as you have in the past but will complete your check-in in the cancer center.  Wear comfortable clothing and clothing appropriate for easy access to any Portacath or PICC line.   We strive to give you quality time with your provider. You may need to reschedule your appointment if you arrive late (15 or more minutes).  Arriving late affects you and other patients whose appointments are after yours.  Also, if you miss three or more appointments without notifying the office, you may be dismissed from the clinic at the provider's discretion.      For prescription refill requests, have your pharmacy contact our office and allow 72 hours for refills to be completed.    Today you received the following chemotherapy and/or immunotherapy agents [name]      To help prevent nausea and vomiting after your treatment, we encourage you to take your nausea medication as directed.  BELOW ARE SYMPTOMS THAT SHOULD BE REPORTED IMMEDIATELY: *FEVER GREATER THAN 100.4 F (38 C) OR HIGHER *CHILLS OR SWEATING *NAUSEA AND VOMITING THAT IS NOT CONTROLLED WITH YOUR NAUSEA MEDICATION *UNUSUAL SHORTNESS OF BREATH *UNUSUAL BRUISING OR BLEEDING *URINARY PROBLEMS (pain or burning when urinating, or frequent urination) *BOWEL PROBLEMS (unusual diarrhea, constipation, pain near the anus) TENDERNESS IN MOUTH AND THROAT WITH OR WITHOUT PRESENCE OF ULCERS (sore throat, sores in mouth, or a toothache) UNUSUAL RASH, SWELLING OR PAIN  UNUSUAL VAGINAL DISCHARGE OR ITCHING   Items with * indicate a potential emergency and should be followed up as soon as possible or go to the  Emergency Department if any problems should occur.  Please show the CHEMOTHERAPY ALERT CARD or IMMUNOTHERAPY ALERT CARD at check-in to the Emergency Department and triage nurse.  Should you have questions after your visit or need to cancel or reschedule your appointment, please contact [facility]ENTER AT [facility] [phone]  and follow the prompts.  Office hours are 8:00 a.m. to 4:30 p.m. Monday - Friday. Please note that voicemails left after 4:00 p.m. may not be returned until the following business day.  We are closed weekends and major holidays. You have access to a nurse at all times for urgent questions. Please call the main number to the clinic [phone] and follow the prompts.  For any non-urgent questions, you may also contact your provider using MyChart. We now offer e-Visits for anyone 59 and older to request care online for non-urgent symptoms. For details visit [email or web address].   Also download the MyChart app! Go to the app store, search "MyChart", open the app, select [place], and log in with your MyChart username and password.

## 2022-10-20 NOTE — Progress Notes (Signed)
Peter Dunlap presents today for Firmagon injection per the provider's orders.  Stable during administration without incident; injection site WNL; see MAR for injection details.  Patient tolerated procedure well and without incident.  No questions or complaints noted at this time.  

## 2022-10-23 ENCOUNTER — Other Ambulatory Visit (HOSPITAL_COMMUNITY): Payer: Self-pay

## 2022-10-23 ENCOUNTER — Other Ambulatory Visit: Payer: Self-pay | Admitting: *Deleted

## 2022-10-23 ENCOUNTER — Other Ambulatory Visit: Payer: Self-pay | Admitting: Hematology

## 2022-10-23 ENCOUNTER — Other Ambulatory Visit: Payer: Self-pay

## 2022-10-23 MED ORDER — APALUTAMIDE 60 MG PO TABS
ORAL_TABLET | ORAL | 3 refills | Status: DC
  Filled 2022-10-23: qty 120, 30d supply, fill #0
  Filled 2022-11-23: qty 120, 30d supply, fill #1
  Filled 2022-12-21: qty 120, 30d supply, fill #2
  Filled 2023-01-22: qty 120, 30d supply, fill #3

## 2022-10-23 NOTE — Telephone Encounter (Signed)
Erleada refill approved.  Patient tolerating and is to continue therapy.

## 2022-10-24 ENCOUNTER — Encounter: Payer: Self-pay | Admitting: Urology

## 2022-10-24 ENCOUNTER — Ambulatory Visit (INDEPENDENT_AMBULATORY_CARE_PROVIDER_SITE_OTHER): Payer: 59 | Admitting: Urology

## 2022-10-24 VITALS — BP 94/62 | HR 67

## 2022-10-24 DIAGNOSIS — C61 Malignant neoplasm of prostate: Secondary | ICD-10-CM | POA: Diagnosis not present

## 2022-10-24 DIAGNOSIS — R350 Frequency of micturition: Secondary | ICD-10-CM

## 2022-10-24 DIAGNOSIS — N401 Enlarged prostate with lower urinary tract symptoms: Secondary | ICD-10-CM

## 2022-10-24 DIAGNOSIS — Z191 Hormone sensitive malignancy status: Secondary | ICD-10-CM

## 2022-10-24 NOTE — Progress Notes (Signed)
History of Present Illness: Fabio is here today for follow-up of prostate cancer and BPH with symptoms:      Prostate cancer:    He underwent TRUS/Bx on 8.15.2017. At that time, PSA was 9.09. Prostatic volume was 25 cc. 10/12 cores came back positive for adenocarcinoma as follows: 1 core revealed GS 3+3 5 cores revealed GS 3+4 4 cores revealed GS 4+3   He completed IMRT, 40 sessions, on 1.10.2018   8.24.2021: Repeat TRUS/Bx for elevating PSA trend following definitive Rx. 1/12 cores-from the left mid lateral region-came back positive for adenocarcinoma.  Grading was difficult due to his prior radiation.     10.15.2021: -CT A/P  with newly enlarged left iliac lymph nodes measuring 1.1 x 0.9 cm.  Hepatic steatosis with early signs of cirrhosis.   04/12/2020 -F-18 PSMA PET scan on showed radiotracer activity associated with enlarged left external iliac lymph node 9 mm with SUV 7.6.  Small left common iliac lymph node 6 mm, SUV 8.6.  Left periaortic lymph node 5 mm, SUV 6.3.  Lymph node between IVC and aorta at the level of right renal hilum, SUV 8.6.  Lymph node deep to the IVC measuring 6 mm, SUV 5.4.  No skeletal metastasis.   11.17.2021: Began ADT with [place].   7.5.2022:  He is still on [place]  Most recent PSA is less than 0.1.  Testosterone level 5.   11.9.2022: PSA < 0.01. T level castrate.  Still on monthly Firmagon 80 mg as well as Erleada.     7.18.2023: PSA is 0.01, testosterone level still castrate.  Continues on [place].  He does complain of shortness of breath.  Most recent hemoglobin 8.9.  He did get iron infusion x2 in late summer 2023.   11.21.2023: Most recent PSA in September less than 0.01, nadir level for him.  He did experience hematuria in September-passed a clot, did have frequency and urgency.  Urine culture grew beta-hemolytic strep.  He has not had recurrent gross hematuria or dysuria.      BPH     7.18.2023: He has  been treated for BPH w/ LUTS w/ silodosin q 12 hrs.  Additionally, he apparently has been on Solifenacin although he is not familiar with this medication at the present time.  He is complaining mainly about his lower urinary tract symptoms.  IPSS 26, quality-of-life score 3.  Residual urine volume today 52 mL. I recommended that he make sure that he is taking the solifenacin. Recommended that he limit caffeine intake and pm fluids.   8.29.2023: He is on dual therapy including Solifenacin daily and Rapaflo twice a day.  Still having nocturia x4-5.  Has not limited his evening fluids, however.    5.21.2024: PSA 2 months ago was still undetectable.  Still with urinary frequency, urgency and urgency incontinence as well as nocturia.  On Rapaflo twice a day as well as Solifenacin 10 mg.  No gross hematuria, no dysuria.  He does complain about pain at his injection site from [place].  He gets this monthly.    Past Medical History:  Diagnosis Date   Arteriosclerotic cardiovascular disease (ASCVD) 2005   catheterization in 10/2010:50% mid LAD, diffuse distal disease, circumflex irregularities, large dominant RCA with a 50% ostial, 70% distal, 60% posterolateral and 70% PDA; normal EF   Arthritis    Benign prostatic hypertrophy    Bilateral carpal tunnel syndrome 07/03/2018   Cerebrovascular disease 2010   R. carotid endarterectomy;  Duplex in 10/2010-widely patent ICAs, subtotal left vertebral-not thought to be contributing to symptoms   Cervical spine disease    CT in 2012-advanced degeneration and spondylosis with moderate spinal stenosis at C3-C6   CHF (congestive heart failure) (HCC)    Depression    Diabetes mellitus without complication (HCC)    Erectile dysfunction    Family history of breast cancer    Family history of cancer of mouth    Family history of CML (chronic monocytic leukemia)    Family history of lung cancer    Family history of ovarian cancer    Family history of stomach cancer     Gastroesophageal reflux disease    H/O hiatal hernia    H/O: substance abuse (HCC)    Cocaine, marijuana, alcohol.  Quit 2013.    Hyperlipidemia    Hypertension    Non-ST elevation myocardial infarction (NSTEMI), initial episode of care (HCC) 12/02/2013   DES LAD   Obesity    Prostate cancer (HCC)    Sleep apnea    CPAP   Tachy-brady syndrome (HCC)    a. 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 25, 2010    CEA   COLONOSCOPY WITH PROPOFOL N/A 06/24/2021   Procedure: COLONOSCOPY WITH PROPOFOL;  Surgeon: Jeani Hawking, MD;  Location: [facility] ENDOSCOPY;  Service: Endoscopy;  Laterality: N/A;   CORONARY ANGIOPLASTY WITH STENT PLACEMENT  12/03/2013   LAD 90%-->0% W/ Promus Premier DES 3.0 mm x 16 mm, CFX OK, RCA 40%, EF 70-75%   LEFT ATRIAL APPENDAGE OCCLUSION N/A 08/05/2015   Procedure: LEFT ATRIAL APPENDAGE OCCLUSION;  Surgeon: [name], MD;  Location: [facility] INVASIVE CV LAB;  Service: Cardiovascular;  Laterality: N/A;   LEFT HEART CATH AND CORONARY ANGIOGRAPHY N/A 09/13/2021   Procedure: LEFT HEART CATH AND CORONARY ANGIOGRAPHY;  Surgeon: Orbie Pyo, MD;  Location: [facility] INVASIVE CV LAB;  Service: Cardiovascular;  Laterality: N/A;   LEFT HEART CATHETERIZATION WITH CORONARY ANGIOGRAM Left 12/03/2013   Procedure: LEFT HEART CATHETERIZATION WITH CORONARY ANGIOGRAM;  Surgeon: Marykay Lex, MD;  Location[facility] CATH LAB;  Service: Cardiovascular;  Laterality: Left;   LEFT HEART CATHETERIZATION WITH CORONARY ANGIOGRAM N/A 01/26/2014   Procedure: LEFT HEART CATHETERIZATION WITH CORONARY ANGIOGRAM;  Surgeon: Corky Crafts, MD;   Location: [facility] CATH LAB;  Service: Cardiovascular;  Laterality: N/A;   LEFT HEART CATHETERIZATION WITH CORONARY ANGIOGRAM N/A 08/03/2014   Procedure: LEFT HEART CATHETERIZATION WITH CORONARY ANGIOGRAM;  Surgeon: Kathleene Hazel, MD;  Location: [facility] CATH LAB;  Service: Cardiovascular;  Laterality: N/A;   PERCUTANEOUS CORONARY STENT INTERVENTION (PCI-S)  12/03/2013   Procedure: PERCUTANEOUS CORONARY STENT INTERVENTION (PCI-S);  Surgeon: Marykay Lex, MD;  Location: [facility] CATH LAB;  Service: Cardiovascular;;   PERMANENT PACEMAKER INSERTION N/A 09/18/2014   Procedure: PERMANENT PACEMAKER INSERTION;  Surgeon: Marinus Maw, MD;  Location: [facility] CATH LAB;  Service: Cardiovascular;  Laterality: N/A;   POLYPECTOMY  06/24/2021   Procedure: POLYPECTOMY;  Surgeon: Jeani Hawking, MD;  Location: [facility] ENDOSCOPY;  Service: Endoscopy;;   RADIOFREQUENCY  ABLATION  2005   for PSVT   TEE WITHOUT CARDIOVERSION N/A 07/27/2015   Procedure: TRANSESOPHAGEAL ECHOCARDIOGRAM (TEE);  Surgeon: Lewayne Bunting, MD;  Location: [facility] ENDOSCOPY;  Service: Cardiovascular;  Laterality: N/A;   TEE WITHOUT CARDIOVERSION N/A 09/15/2015   Procedure: TRANSESOPHAGEAL ECHOCARDIOGRAM (TEE);  Surgeon: Vesta Mixer, MD;  Location: [facility] ENDOSCOPY;  Service: Cardiovascular;  Laterality: N/A;    Home Medications:  Allergies as of 10/24/2022       Reactions   Trazodone And Nefazodone    Nightmares   Lactose Intolerance (gi) Other (See Comments)   UPSET STOMACH        Medication List        Accurate as of Oct 24, 2022  8:02 AM. If you have any questions, ask your nurse or doctor.          allopurinol 100 MG tablet Commonly known as: ZYLOPRIM TAKE 1 TABLET(100 MG) BY MOUTH DAILY   apalutamide 60 MG tablet Commonly known as: ERLEADA TAKE 4 TABLETS (240 MG TOTAL) BY MOUTH DAILY. MAY BE TAKEN WITH OR WITHOUT FOOD. SWALLOW TABLETS WHOLE.   aspirin EC 325 MG tablet Take 1 tablet (325 mg total) daily by mouth.   Blood Glucose System  Pak Kit Use as directed to monitor FSBS 1x daily. Dx: E11.9.   diclofenac Sodium 1 % Gel Commonly known as: VOLTAREN 2 g 2 (two) times daily.   Farxiga 5 MG Tabs tablet Generic drug: dapagliflozin propanediol Take 5 mg by mouth daily.   fluticasone 50 MCG/ACT nasal spray Commonly known as: FLONASE Place 2 sprays into both nostrils daily.   furosemide 20 MG tablet Commonly known as: LASIX TAKE 3 TABLETS BY MOUTH DAILY   glipiZIDE 5 MG 24 hr tablet Commonly known as: GLUCOTROL XL Take 1 tablet (5 mg total) by mouth daily with breakfast.   isosorbide mononitrate 30 MG 24 hr tablet Commonly known as: IMDUR Take 1 tablet (30 mg total) by mouth daily.   Lactulose 20 GM/30ML Soln Take 30 mLs (20 g total) by mouth daily as needed. Take 30 ml by mouth every 3 hours until you have bowel movement then daily as needed   levothyroxine 25 MCG tablet Commonly known as: SYNTHROID Take 1 tablet (25 mcg total) by mouth See admin instructions. Takes along with 200 mcg to total 225 mcg daily   levothyroxine 200 MCG tablet Commonly known as: SYNTHROID TAKE 1 TABLET(200 MCG) BY MOUTH EVERY DAY BEFORE BREAKFAST   loratadine 10 MG tablet Commonly known as: CLARITIN TAKE 1 TABLET(10 MG) BY MOUTH DAILY AS NEEDED FOR ALLERGIES   magnesium oxide 400 (240 Mg) MG tablet Commonly known as: MAG-OX TAKE 1 TABLET(400 MG) BY MOUTH TWICE DAILY   metFORMIN 500 MG tablet Commonly known as: GLUCOPHAGE Take 1 tablet (500 mg total) by mouth daily with breakfast.   naloxone 4 MG/0.1ML Liqd nasal spray kit Commonly known as: NARCAN Place 4 mg into the nose once as needed (accidental overdose).   nitroGLYCERIN 0.4 MG SL tablet Commonly known as: NITROSTAT PLACE 1 TABLET UNDER THE TONGUE EVERY 5 MINUTES AS NEEDED FOR CHEST PAIN. CALL 911 AT THIRD DOSE IN 15 MINUTES   OneTouch Delica Plus Lancet30G Misc USE EVERY DAY FOR GLUCOSE TESTING   OneTouch Ultra test strip Generic drug: glucose blood Use as  instructed to monitor glucose twice daily.   oxyCODONE-acetaminophen 10-325 MG tablet Commonly known as: PERCOCET Take 1 tablet by mouth every 8 (eight) hours as needed for pain.  pantoprazole 40 MG tablet Commonly known as: PROTONIX TAKE 1 TABLET(40 MG) BY MOUTH DAILY   potassium chloride SA 20 MEQ tablet Commonly known as: KLOR-CON M Take 1 tablet (20 mEq total) by mouth daily.   rosuvastatin 40 MG tablet Commonly known as: CRESTOR Take 40 mg by mouth every evening.   silodosin 8 MG Caps capsule Commonly known as: RAPAFLO TAKE 1 CAPSULE BY MOUTH TWICE DAILY   solifenacin 10 MG tablet Commonly known as: VESICARE TAKE 1 TABLET(10 MG) BY MOUTH DAILY   sotalol 80 MG tablet Commonly known as: BETAPACE Take 1 tablet (80 mg total) by mouth 2 (two) times daily.   Vitamin B 12 500 MCG Tabs Take 500 mcg by mouth in the morning.   Vitamin D (Ergocalciferol) 1.25 MG (50000 UNIT) Caps capsule Commonly known as: DRISDOL TAKE 1 CAPSULE BY MOUTH EVERY 7 DAYS        Allergies:  Allergies  Allergen Reactions   Trazodone And Nefazodone     Nightmares   Lactose Intolerance (Gi) Other (See Comments)    UPSET STOMACH     Family History  Problem Relation Age of Onset   Hypertension Mother        Cerebrovascular disease   Diabetes Mother    Coronary artery disease Father 14   Diabetes type II Father    Hypertension Father    Heart attack Father    Diabetes Brother    Hypertension Brother    Diabetes Sister    Hypertension Sister    Heart attack Sister 69   Leukemia Sister 64       CML   Breast cancer Maternal Grandmother        dx 66s   Lung cancer Maternal Uncle        dx >50, smoker   Cancer Cousin        mouth cancer, dx 9s, no smoking/chew tobacco hx (maternal 1st cousin)   Ovarian cancer Cousin        dx <50 (maternal 1st cousin)   Stomach cancer Cousin        dx 12s (maternal 1st cousin)   Cancer Cousin        type unknown to pt, dx >50 (paternal 1st  cousin)    Social History:  reports that he quit smoking about 10 years ago. His smoking use included cigarettes. He started smoking about 50 years ago. He has a 40.00 pack-year smoking history. He has never used smokeless tobacco. He reports current alcohol use. He reports that he does not use drugs.  ROS: A complete review of systems was performed.  All systems are negative except for pertinent findings as noted.  Physical Exam:  Vital signs in last 24 hours: There were no vitals taken for this visit. Constitutional:  Alert and oriented, No acute distress Cardiovascular: Regular rate  Respiratory: Normal respiratory effort Lymphatic: No lymphadenopathy Neurologic: Grossly intact, no focal deficits Psychiatric: Normal mood and affect  I have reviewed prior pt notes  I have reviewed notes from referring/previous physicians  I have reviewed urinalysis results--microscopic hematuria present  I have independently reviewed prior imaging--bladder scan today revealed residual volume of 6 mL  I have reviewed prior PSA results    Impression/Assessment:  Metastatic castrate sensitive prostate cancer, 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  He is having continued pain at injection sites from [place]  Lower urinary tract symptoms, still significant.  Emptying out well  Microscopic hematuria, new  Plan:  1.  I gave him samples of Gemtesa for 10 weeks-I will have him come back at about that time to check on his voiding.  Might be worthwhile considering PTNS  2.  I will contact Dr. Kirtland Bouchard regarding switching from monthly Firmagon to leuprolide

## 2022-10-25 LAB — URINALYSIS, ROUTINE W REFLEX MICROSCOPIC
Bilirubin, UA: NEGATIVE
Nitrite, UA: NEGATIVE
Specific Gravity, UA: 1.02 (ref 1.005–1.030)
Urobilinogen, Ur: 2 mg/dL — ABNORMAL HIGH (ref 0.2–1.0)
pH, UA: 6.5 (ref 5.0–7.5)

## 2022-10-25 LAB — MICROSCOPIC EXAMINATION

## 2022-10-26 ENCOUNTER — Other Ambulatory Visit (HOSPITAL_COMMUNITY): Payer: Self-pay

## 2022-10-29 ENCOUNTER — Emergency Department (HOSPITAL_COMMUNITY)
Admission: EM | Admit: 2022-10-29 | Discharge: 2022-10-29 | Disposition: A | Discharge status: Home / Self Care | Payer: 59 | Attending: Emergency Medicine | Admitting: Emergency Medicine

## 2022-10-29 ENCOUNTER — Other Ambulatory Visit: Payer: Self-pay

## 2022-10-29 ENCOUNTER — Encounter (HOSPITAL_COMMUNITY): Payer: Self-pay

## 2022-10-29 ENCOUNTER — Emergency Department (HOSPITAL_COMMUNITY): Payer: 59

## 2022-10-29 DIAGNOSIS — Z7982 Long term (current) use of aspirin: Secondary | ICD-10-CM | POA: Insufficient documentation

## 2022-10-29 DIAGNOSIS — I509 Heart failure, unspecified: Secondary | ICD-10-CM | POA: Diagnosis not present

## 2022-10-29 DIAGNOSIS — Z8546 Personal history of malignant neoplasm of prostate: Secondary | ICD-10-CM | POA: Insufficient documentation

## 2022-10-29 DIAGNOSIS — I2510 Atherosclerotic heart disease of native coronary artery without angina pectoris: Secondary | ICD-10-CM | POA: Diagnosis not present

## 2022-10-29 DIAGNOSIS — E119 Type 2 diabetes mellitus without complications: Secondary | ICD-10-CM | POA: Diagnosis not present

## 2022-10-29 DIAGNOSIS — Z7984 Long term (current) use of oral hypoglycemic drugs: Secondary | ICD-10-CM | POA: Insufficient documentation

## 2022-10-29 DIAGNOSIS — R519 Headache, unspecified: Secondary | ICD-10-CM | POA: Diagnosis present

## 2022-10-29 DIAGNOSIS — G43809 Other migraine, not intractable, without status migrainosus: Secondary | ICD-10-CM | POA: Diagnosis not present

## 2022-10-29 LAB — CBC WITH DIFFERENTIAL/PLATELET
Abs Immature Granulocytes: 0.01 10*3/uL (ref 0.00–0.07)
Basophils Absolute: 0 10*3/uL (ref 0.0–0.1)
Basophils Relative: 1 %
Eosinophils Absolute: 0.1 10*3/uL (ref 0.0–0.5)
Eosinophils Relative: 2 %
HCT: 33 % — ABNORMAL LOW (ref 39.0–52.0)
Hemoglobin: 11.1 g/dL — ABNORMAL LOW (ref 13.0–17.0)
Immature Granulocytes: 0 %
Lymphocytes Relative: 21 %
Lymphs Abs: 1 10*3/uL (ref 0.7–4.0)
MCH: 33.6 pg (ref 26.0–34.0)
MCHC: 33.6 g/dL (ref 30.0–36.0)
MCV: 100 fL (ref 80.0–100.0)
Monocytes Absolute: 0.5 10*3/uL (ref 0.1–1.0)
Monocytes Relative: 10 %
Neutro Abs: 3.2 10*3/uL (ref 1.7–7.7)
Neutrophils Relative %: 66 %
Platelets: 102 10*3/uL — ABNORMAL LOW (ref 150–400)
RBC: 3.3 MIL/uL — ABNORMAL LOW (ref 4.22–5.81)
RDW: 14.9 % (ref 11.5–15.5)
WBC: 4.8 10*3/uL (ref 4.0–10.5)
nRBC: 0 % (ref 0.0–0.2)

## 2022-10-29 LAB — BASIC METABOLIC PANEL
Anion gap: 9 (ref 5–15)
BUN: 9 mg/dL (ref 8–23)
CO2: 18 mmol/L — ABNORMAL LOW (ref 22–32)
Calcium: 8.4 mg/dL — ABNORMAL LOW (ref 8.9–10.3)
Chloride: 111 mmol/L (ref 98–111)
Creatinine, Ser: 1.09 mg/dL (ref 0.61–1.24)
GFR, Estimated: >60 mL/min (ref >60)
Glucose, Bld: 140 mg/dL — ABNORMAL HIGH (ref 70–99)
Potassium: 3.1 mmol/L — ABNORMAL LOW (ref 3.5–5.1)
Sodium: 138 mmol/L (ref 135–145)

## 2022-10-29 LAB — TROPONIN I (HIGH SENSITIVITY)
Troponin I (High Sensitivity): 20 ng/L — ABNORMAL HIGH (ref <18)
Troponin I (High Sensitivity): 20 ng/L — ABNORMAL HIGH (ref <18)

## 2022-10-29 LAB — SEDIMENTATION RATE: Sed Rate: 26 mm/hr — ABNORMAL HIGH (ref 0–16)

## 2022-10-29 MED ORDER — POTASSIUM CHLORIDE CRYS ER 20 MEQ PO TBCR
40.0000 meq | EXTENDED_RELEASE_TABLET | Freq: Once | ORAL | Status: AC
  Administered 2022-10-29: 40 meq via ORAL
  Filled 2022-10-29: qty 2

## 2022-10-29 MED ORDER — PROCHLORPERAZINE EDISYLATE 10 MG/2ML IJ SOLN
10.0000 mg | Freq: Once | INTRAMUSCULAR | Status: AC
  Administered 2022-10-29: 10 mg via INTRAVENOUS
  Filled 2022-10-29: qty 2

## 2022-10-29 NOTE — Discharge Instructions (Signed)
Please follow-up with your primary care provider and neurologist regarding recent symptoms and ER visit.  Today your labs and imaging were all reassuring and you most likely had a migraine.  You may take Tylenol every 6 hours as needed for pain.  If symptoms worsen please return to ER.

## 2022-10-29 NOTE — ED Notes (Signed)
Placed on cardiac monitoring

## 2022-10-29 NOTE — ED Provider Notes (Signed)
[place] [facility] DE[facility]MENT AT [facility] Provider Note   CSN: [id number] Arrival date & time: 10/29/22  8657     History  Chief Complaint  Patient presents with   Headache    Peter Dunlap is a 67 y.o. male history of heart failure, paroxysmal A-fib, hokum, PSVT, CAD, type 2 diabetes, bilateral carotid artery stenosis, tachybradycardia syndrome, prostate cancer, STEMI, subdural hematoma November 2015 presented with a headache that is been present for the last 2 weeks.  Patient states that he had a fall 2 weeks to try to get into his car in which he slipped and hit the back of his head.  Since then patient has had pain on the left side of his head that has been constant and sharp.  Patient denies vision changes, new onset weakness, change in sensation/motor skills, dizziness, chest pain, shortness of breath, nausea/vomiting, abdominal pain, fever, recent illness, dysuria, jaw claudication.  Patient does note that he has some photophobia.  Patient is currently on Percocets for his chronic joint pain and states symptoms have not been helping.  Patient does note that he had a fall last week as well but did not hit his head but also does not remember much of the fall except for landing on his butt.   Home Medications Prior to Admission medications   Medication Sig Start Date End Date Taking? Authorizing Provider  allopurinol (ZYLOPRIM) 100 MG tablet TAKE 1 TABLET(100 MG) BY MOUTH DAILY 09/07/20   Donita Brooks, MD  apalutamide (ERLEADA) 60 MG tablet TAKE 4 TABLETS (240 MG TOTAL) BY MOUTH DAILY. MAY BE TAKEN WITH OR WITHOUT FOOD. SWALLOW TABLETS WHOLE. 10/23/22 10/23/23  [name], MD  aspirin EC 325 MG tablet Take 1 tablet (325 mg total) daily by mouth. 04/13/17   Tereso Newcomer T, PA-C  Blood Glucose Monitoring Suppl (BLOOD GLUCOSE SYSTEM PAK) KIT Use as directed to monitor FSBS 1x daily. Dx: E11.9. 03/04/19   Salley Scarlet, MD  Cyanocobalamin (VITAMIN B 12) 500  MCG TABS Take 500 mcg by mouth in the morning.    [provider]  diclofenac Sodium (VOLTAREN) 1 % GEL 2 g 2 (two) times daily. 08/18/21   [provider]  FARXIGA 5 MG TABS tablet Take 5 mg by mouth daily. 11/14/21   [provider]  fluticasone (FLONASE) 50 MCG/ACT nasal spray Place 2 sprays into both nostrils daily. 06/02/20   Valentino Nose, NP  furosemide (LASIX) 20 MG tablet TAKE 3 TABLETS BY MOUTH DAILY 06/24/19   Marinus Maw, MD  glipiZIDE (GLUCOTROL XL) 5 MG 24 hr tablet Take 1 tablet (5 mg total) by mouth daily with breakfast. 10/10/21   Dani Gobble, NP  glucose blood (ONETOUCH ULTRA) test strip Use as instructed to monitor glucose twice daily. 10/10/21   Dani Gobble, NP  isosorbide mononitrate (IMDUR) 30 MG 24 hr tablet Take 1 tablet (30 mg total) by mouth daily. 07/27/22   Tereso Newcomer T, PA-C  Lactulose 20 GM/30ML SOLN Take 30 mLs (20 g total) by mouth daily as needed. Take 30 ml by mouth every 3 hours until you have bowel movement then daily as needed 09/20/21   [name], MD  Lancets [facility] DELICA PLUS LANCET30G) MISC USE EVERY DAY FOR GLUCOSE TESTING 05/10/22   [provider]  levothyroxine (SYNTHROID) 200 MCG tablet TAKE 1 TABLET(200 MCG) BY MOUTH EVERY DAY BEFORE BREAKFAST 09/26/22   Dani Gobble, NP  levothyroxine (SYNTHROID) 25 MCG  tablet Take 1 tablet (25 mcg total) by mouth See admin instructions. Takes along with 200 mcg to total 225 mcg daily 10/10/21   Dani Gobble, NP  loratadine (CLARITIN) 10 MG tablet TAKE 1 TABLET(10 MG) BY MOUTH DAILY AS NEEDED FOR ALLERGIES 04/19/20   Salley Scarlet, MD  magnesium oxide (MAG-OX) 400 (240 Mg) MG tablet TAKE 1 TABLET(400 MG) BY MOUTH TWICE DAILY 10/19/21   Marinus Maw, MD  metFORMIN (GLUCOPHAGE) 500 MG tablet Take 1 tablet (500 mg total) by mouth daily with breakfast. 10/10/21   Dani Gobble, NP  naloxone [facility]) nasal spray 4 mg/0.1 mL Place 4 mg into  the nose once as needed (accidental overdose). 01/28/21   [provider]  nitroGLYCERIN (NITROSTAT) 0.4 MG SL tablet PLACE 1 TABLET UNDER THE TONGUE EVERY 5 MINUTES AS NEEDED FOR CHEST PAIN. CALL 911 AT THIRD DOSE IN 15 MINUTES 09/07/22   Sheilah Pigeon, PA-C  oxyCODONE-acetaminophen (PERCOCET) 10-325 MG tablet Take 1 tablet by mouth every 8 (eight) hours as needed for pain. 08/13/20   Salley Scarlet, MD  pantoprazole (PROTONIX) 40 MG tablet TAKE 1 TABLET(40 MG) BY MOUTH DAILY 07/19/20   [place], Velna Hatchet, MD  potassium chloride SA (KLOR-CON M) 20 MEQ tablet Take 1 tablet (20 mEq total) by mouth daily. 09/29/21   Parke Poisson, MD  rosuvastatin (CRESTOR) 40 MG tablet Take 40 mg by mouth every evening. 04/02/21   [provider]  silodosin (RAPAFLO) 8 MG CAPS capsule TAKE 1 CAPSULE BY MOUTH TWICE DAILY 05/04/22   Marcine Matar, MD  solifenacin (VESICARE) 10 MG tablet TAKE 1 TABLET(10 MG) BY MOUTH DAILY 05/02/22   Marcine Matar, MD  sotalol (BETAPACE) 80 MG tablet Take 1 tablet (80 mg total) by mouth 2 (two) times daily. 09/14/21   Bhagat, Sharrell Ku, PA  Vitamin D, Ergocalciferol, (DRISDOL) 1.25 MG (50000 UNIT) CAPS capsule TAKE 1 CAPSULE BY MOUTH EVERY 7 DAYS 06/25/20   Salley Scarlet, MD      Allergies    Trazodone and nefazodone and Lactose intolerance (gi)    Review of Systems   Review of Systems  Neurological:  Positive for headaches.    Physical Exam Updated Vital Signs BP 106/68   Pulse 61   Temp 97.8 F (36.6 C)   Resp 15   Ht 5\' 11"  (1.803 m)   Wt 130.6 kg   SpO2 96%   BMI 40.17 kg/m  Physical Exam Vitals reviewed.  Constitutional:      General: He is not in acute distress. HENT:     Head: Normocephalic and atraumatic.  Eyes:     Extraocular Movements: Extraocular movements intact.     Conjunctiva/sclera: Conjunctivae normal.     Pupils: Pupils are equal, round, and reactive to light.  Cardiovascular:     Rate and Rhythm: Normal  rate and regular rhythm.     Pulses: Normal pulses.     Heart sounds: Normal heart sounds.     Comments: 2+ bilateral radial/dorsalis pedis pulses with regular rate Pulmonary:     Effort: Pulmonary effort is normal. No respiratory distress.     Breath sounds: Normal breath sounds.  Abdominal:     Palpations: Abdomen is soft.     Tenderness: There is no abdominal tenderness. There is no guarding or rebound.  Musculoskeletal:        General: Normal range of motion.     Cervical back: Normal range of motion and neck supple.  Comments: 5 out of 5 bilateral grip/leg extension strength  Skin:    General: Skin is warm and dry.     Capillary Refill: Capillary refill takes less than 2 seconds.  Neurological:     General: No focal deficit present.     Mental Status: He is alert and oriented to person, place, and time.     Sensory: Sensation is intact.     Motor: Motor function is intact.     Coordination: Coordination is intact.     Comments: Sensation intact in all 4 limbs Vision grossly intact Cranial nerves III through XII intact  Psychiatric:        Mood and Affect: Mood normal.     ED Results / Procedures / Treatments   Labs (all labs ordered are listed, but only abnormal results are displayed) Labs Reviewed  BASIC METABOLIC PANEL - Abnormal; Notable for the following components:      Result Value   Potassium 3.1 (*)    CO2 18 (*)    Glucose, Bld 140 (*)    Calcium 8.4 (*)    All other components within normal limits  CBC WITH DIFFERENTIAL/PLATELET - Abnormal; Notable for the following components:   RBC 3.30 (*)    Hemoglobin 11.1 (*)    HCT 33.0 (*)    Platelets 102 (*)    All other components within normal limits  SEDIMENTATION RATE - Abnormal; Notable for the following components:   Sed Rate 26 (*)    All other components within normal limits  TROPONIN I (HIGH SENSITIVITY) - Abnormal; Notable for the following components:   Troponin I (High Sensitivity) 20 (*)     All other components within normal limits  TROPONIN I (HIGH SENSITIVITY) - Abnormal; Notable for the following components:   Troponin I (High Sensitivity) 20 (*)    All other components within normal limits  URINALYSIS, ROUTINE W REFLEX MICROSCOPIC    EKG EKG Interpretation  Date/Time:  Sunday Oct 29 2022 09:40:31 EDT Ventricular Rate:  61 PR Interval:  207 QRS Duration: 102 QT Interval:  566 QTC Calculation: 571 R Axis:   94 Text Interpretation: Sinus rhythm Right axis deviation Borderline repolarization abnormality Prolonged QT interval No significant change since prior 4/23 Confirmed by Meridee Score [phone]) on 10/29/2022 10:10:47 AM  Radiology CT Head Wo Contrast  Result Date: 10/29/2022 CLINICAL DATA:  Left-sided headache.  History of subdural hemorrhage EXAM: CT HEAD WITHOUT CONTRAST TECHNIQUE: Contiguous axial images were obtained from the base of the skull through the vertex without intravenous contrast. RADIATION DOSE REDUCTION: This exam was performed according to the departmental dose-optimization program which includes automated exposure control, adjustment of the mA and/or kV according to patient size and/or use of iterative reconstruction technique. COMPARISON:  09/13/2021 FINDINGS: Brain: No evidence of acute infarction, hemorrhage, hydrocephalus, extra-axial collection or mass lesion/mass effect. 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 Vascular: No hyperdense vessel or unexpected calcification. Skull: Burr holes on the right with history of subdural hemorrhage. Sinuses/Orbits: No acute finding. IMPRESSION: No acute or interval finding. Electronically Signed   By: Tiburcio Pea M.D.   On: 10/29/2022 10:05    Procedures Procedures    Medications Ordered in ED Medications  prochlorperazine (COMPAZINE) injection 10 mg (10 mg Intravenous Given 10/29/22 1137)  potassium chloride SA (KLOR-CON M) CR tablet 40 mEq (40 mEq Oral Given 10/29/22 1135)    ED Course/ Medical  Decision Making/ A&P  Medical Decision Making Amount and/or Complexity of Data Reviewed Labs: ordered. Radiology: ordered.  Risk Prescription drug management.   Tedd Sias Lannom 67 y.o. presented today for left-sided headache. Working DDx that I considered at this time includes, but not limited to, tension headache, migraine intracranial mass, intracranial hemorrhage, intracranial infection including meningitis vs encephalitis, GCA, trigeminal neuralgia.  R/o DDx: Tension HA, intracranial mass, intracranial hemorrhage, intracranial infection including meningitis vs encephalitis, GCA, trigeminal neuralgia:  less likely due to history of present illness and physical exam findings SAH/ICH: Timeline and slow onset is not consistent with SAH/ICH  GCA: Age and description of pain is not consistent with GCA  Meningitis/encephalitis: Lack of fever,meningismus is not consistent Intracranial Mass:  less likely due to history of present illness and physical exam findings Trigeminal Neuralgia: headache does not meet this description Intracranial Infection:  less likely due to history of present illness and physical exam findings, no fevers  Review of prior external notes: 10/23/2022 progress Notes  Unique Tests and My Interpretation:  CBC: Unremarkable CMP: Hypokalemia 3.1 Troponin: 20, 20 (baseline) ESR: Slightly elevated 26 CT Head w/o Contrast: No acute intracranial abnormalities  Discussion with Independent Historian: None  Discussion of Management of Tests: None  Risk: Medium: prescription drug management  Risk Stratification Score: none  Plan: Patient presented for HA. On exam patient was in no acute distress and had stable vitals. Physical exam was unremarkable including neuroexam. Labs and imaging will be ordered.  Pending CT patient will be given Compazine for HA treatment. Patient stable at this time.  Patient's labs did show the patient was  hypokalemic at 3.1 and so patient will be given potassium.  The rest the patient's labs are reassuring as his troponin is at baseline and ESR is only mildly elevated.  I suspect at this time patient is having a migraine as it is one-sided and patient is endorsing mild photophobia.  Patient will be treated with Compazine and reevaluated.  After receiving Compazine patient stated that his symptoms had resolved.  I suspect this time patient was having a migraine and do not suspect any life-threatening diagnosis.  Pending patient's delta troponin patient may be discharged with neurology follow-up.  Patient stable this time.  Patient's delta troponin was reassuring.  Patient be discharged with outpatient follow-up.  Patient was given return precautions. Patient stable for discharge at this time.  Patient verbalized understanding of plan.         Final Clinical Impression(s) / ED Diagnoses Final diagnoses:  Other migraine without status migrainosus, not intractable    Rx / DC Orders ED Discharge Orders     None         Remi Deter 10/29/22 1225    Terrilee Files, MD 10/29/22 [phone]

## 2022-10-29 NOTE — ED Notes (Signed)
Patient transported to CT 

## 2022-10-29 NOTE — ED Notes (Signed)
Pt given a urinal and informed of need for a urine sample  

## 2022-10-29 NOTE — ED Triage Notes (Signed)
Patient complains of a headache over the last week with the pain being sharp and constant one the left side. Patient denies any other neurological deficits.

## 2022-11-06 ENCOUNTER — Telehealth: Payer: Self-pay

## 2022-11-06 NOTE — Telephone Encounter (Signed)
Patient is having 5 extractions

## 2022-11-06 NOTE — Telephone Encounter (Signed)
Peter Dunlap with [employer] states she is returning a call.

## 2022-11-06 NOTE — Telephone Encounter (Signed)
   Pre-operative Risk Assessment    Patient Name: Peter Dunlap  DOB: 12-12-1955 MRN: [id number]     Request for Surgical Clearance    Procedure:  Dental extractions (Voicemail left for dental office to call back with # of extractions)  Date of Surgery:  Clearance TBD                                 Surgeon:  Dr. April  Surgeon's Group or Practice Name:  [facility]  Phone number:  [phone] Fax number:  [phone]   Type of Clearance Requested:   - Pharmacy:  Hold Aspirin     Type of Anesthesia:  Not Indicated   Additional requests/questions:    Scarlette Shorts   11/06/2022, 10:56 AM

## 2022-11-07 ENCOUNTER — Telehealth: Payer: Self-pay

## 2022-11-07 NOTE — Telephone Encounter (Signed)
   Name: Peter Dunlap  DOB: [birth year]  MRN: [id number]  Primary Cardiologist: Lewayne Bunting, MD   Preoperative team, please contact this patient and set up a phone call appointment for further preoperative risk assessment. Please obtain consent and complete medication review. Thank you for your help.  I confirm that guidance regarding antiplatelet and oral anticoagulation therapy has been completed and, if necessary, noted below.  Ideally aspirin should be continued without interruption, however if the bleeding risk is too great, aspirin may be held for 5-7 days prior to surgery. Please resume aspirin post operatively when it is felt to be safe from a bleeding standpoint.     Carlos Levering, NP 11/07/2022, 10:59 AM [facility]

## 2022-11-07 NOTE — Telephone Encounter (Signed)
Spoke with patient's wife (DPR on file) who is agreeable to do a tele visit on 6/13 at 10 am. Med rec and consent done.

## 2022-11-07 NOTE — Telephone Encounter (Signed)
  Patient Consent for Virtual Visit        Peter Dunlap has provided verbal consent on 11/07/2022 for a virtual visit (video or telephone).   CONSENT FOR VIRTUAL VISIT FOR:  Peter Dunlap  By participating in this virtual visit I agree to the following:  I hereby voluntarily request, consent and authorize [facility] and its employed or contracted physicians, physician assistants, nurse practitioners or other licensed health care professionals (the Practitioner), to provide me with telemedicine health care services (the "Services") as deemed necessary by the treating Practitioner. I acknowledge and consent to receive the Services by the Practitioner via telemedicine. I understand that the telemedicine visit will involve communicating with the Practitioner through live audiovisual communication technology and the disclosure of certain medical information by electronic transmission. I acknowledge that I have been given the opportunity to request an in-person assessment or other available alternative prior to the telemedicine visit and am voluntarily participating in the telemedicine visit.  I understand that I have the right to withhold or withdraw my consent to the use of telemedicine in the course of my care at any time, without affecting my right to future care or treatment, and that the Practitioner or I may terminate the telemedicine visit at any time. I understand that I have the right to inspect all information obtained and/or recorded in the course of the telemedicine visit and may receive copies of available information for a reasonable fee.  I understand that some of the potential risks of receiving the Services via telemedicine include:  Delay or interruption in medical evaluation due to technological equipment failure or disruption; Information transmitted may not be sufficient (e.g. poor resolution of images) to allow for appropriate medical decision making by the  Practitioner; and/or  In rare instances, security protocols could fail, causing a breach of personal health information.  Furthermore, I acknowledge that it is my responsibility to provide information about my medical history, conditions and care that is complete and accurate to the best of my ability. I acknowledge that Practitioner's advice, recommendations, and/or decision may be based on factors not within their control, such as incomplete or inaccurate data provided by me or distortions of diagnostic images or specimens that may result from electronic transmissions. I understand that the practice of medicine is not an exact science and that Practitioner makes no warranties or guarantees regarding treatment outcomes. I acknowledge that a copy of this consent can be made available to me via my patient portal [facility] MyChart), or I can request a printed copy by calling the office of [facility].    I understand that my insurance will be billed for this visit.   I have read or had this consent read to me. I understand the contents of this consent, which adequately explains the benefits and risks of the Services being provided via telemedicine.  I have been provided ample opportunity to ask questions regarding this consent and the Services and have had my questions answered to my satisfaction. I give my informed consent for the services to be provided through the use of telemedicine in my medical care

## 2022-11-08 ENCOUNTER — Ambulatory Visit (INDEPENDENT_AMBULATORY_CARE_PROVIDER_SITE_OTHER): Payer: 59

## 2022-11-08 DIAGNOSIS — I495 Sick sinus syndrome: Secondary | ICD-10-CM | POA: Diagnosis not present

## 2022-11-08 LAB — CUP PACEART REMOTE DEVICE CHECK
Battery Remaining Longevity: 37 mo
Battery Remaining Percentage: 31 %
Battery Voltage: 2.95 V
Brady Statistic AP VP Percent: 2.4 %
Brady Statistic AP VS Percent: 28 %
Brady Statistic AS VP Percent: 1 %
Brady Statistic AS VS Percent: 69 %
Brady Statistic RA Percent Paced: 29 %
Brady Statistic RV Percent Paced: 2.9 %
Date Time Interrogation Session: 20240605020023
Implantable Lead Connection Status: 753985
Implantable Lead Connection Status: 753985
Implantable Lead Implant Date: 20160415
Implantable Lead Implant Date: 20160415
Implantable Lead Location: 753859
Implantable Lead Location: 753860
Implantable Pulse Generator Implant Date: 20160415
Lead Channel Impedance Value: 410 Ohm
Lead Channel Impedance Value: 580 Ohm
Lead Channel Pacing Threshold Amplitude: 0.75 V
Lead Channel Pacing Threshold Amplitude: 0.75 V
Lead Channel Pacing Threshold Pulse Width: 0.5 ms
Lead Channel Pacing Threshold Pulse Width: 0.5 ms
Lead Channel Sensing Intrinsic Amplitude: 12 mV
Lead Channel Sensing Intrinsic Amplitude: 3.6 mV
Lead Channel Setting Pacing Amplitude: 2 V
Lead Channel Setting Pacing Amplitude: 2.5 V
Lead Channel Setting Pacing Pulse Width: 0.5 ms
Lead Channel Setting Sensing Sensitivity: 2 mV
Pulse Gen Model: 2240
Pulse Gen Serial Number: 7756161

## 2022-11-13 ENCOUNTER — Other Ambulatory Visit: Payer: Self-pay

## 2022-11-13 MED ORDER — SOTALOL HCL 80 MG PO TABS: 80.0000 mg | ORAL_TABLET | Freq: Two times a day (BID) | ORAL | 3 refills | Status: DC

## 2022-11-15 NOTE — Progress Notes (Signed)
Virtual Visit via Telephone Note   Because of Peter Dunlap's co-morbid illnesses, he is at least at moderate risk for complications without adequate follow up.  This format is felt to be most appropriate for this patient at this time.  The patient did not have access to video technology/had technical difficulties with video requiring transitioning to audio format only (telephone).  All issues noted in this document were discussed and addressed.  No physical exam could be performed with this format.  Please refer to the patient's chart for his consent to telehealth for [facility].  Evaluation Performed:  Preoperative cardiovascular risk assessment _____________   Date:  11/15/2022   Patient ID:  Peter Dunlap, DOB [birth year], MRN [id number] Patient Location:  Home Provider location:   Office  Primary Care Provider:  Fleet Contras, MD Primary Cardiologist:  Lewayne Bunting, MD  Chief Complaint / Patient Profile   67 y.o. y/o male with a h/o SVT s/p ablation 2005, CAD with DES to LAD 2015, PVD s/p right CEA 2010, HTN, HLD, OSA on CPAP, atrial fibrillation s/p watchman 2017 given history of CNS bleed, tachybradycardia syndrome with PPM implant who is pending dental extraction x 5 and presents today for telephonic preoperative cardiovascular risk assessment.  History of Present Illness    Peter Dunlap is a 67 y.o. male who presents via audio/video conferencing for a telehealth visit today.  Pt was last seen in cardiology clinic on 07/21/22 by Francis Dowse, PA.  At that time ANTHONYJOSEPH JUSZCZAK was doing well.  The patient is now pending procedure as outlined above. Since his last visit, he denies chest pain, shortness of breath, lower extremity edema, fatigue, palpitations, melena, hematuria, hemoptysis, diaphoresis, weakness, presyncope, syncope, orthopnea, and PND. He has chronic back pain but is able to achieve > 4 METS activity by mowing grass and additional moderate to  heavy house and yard work; no concerning cardiac symptoms.    Past Medical History    Past Medical History:  Diagnosis Date   Arteriosclerotic cardiovascular disease (ASCVD) 2005   catheterization in 10/2010:50% mid LAD, diffuse distal disease, circumflex irregularities, large dominant RCA with a 50% ostial, 70% distal, 60% posterolateral and 70% PDA; normal EF   Arthritis    Benign prostatic hypertrophy    Bilateral carpal tunnel syndrome 07/03/2018   Cerebrovascular disease 2010   R. carotid endarterectomy; Duplex in 10/2010-widely patent ICAs, subtotal left vertebral-not thought to be contributing to symptoms   Cervical spine disease    CT in 2012-advanced degeneration and spondylosis with moderate spinal stenosis at C3-C6   CHF (congestive heart failure) (HCC)    Depression    Diabetes mellitus without complication (HCC)    Erectile dysfunction    Family history of breast cancer    Family history of cancer of mouth    Family history of CML (chronic monocytic leukemia)    Family history of lung cancer    Family history of ovarian cancer    Family history of stomach cancer    Gastroesophageal reflux disease    H/O hiatal hernia    H/O: substance abuse (HCC)    Cocaine, marijuana, alcohol.  Quit 2013.    Hyperlipidemia    Hypertension    Non-ST elevation myocardial infarction (NSTEMI), initial episode of care (HCC) 12/02/2013   DES LAD   Obesity    Prostate cancer (HCC)    Sleep apnea    CPAP   Tachy-brady syndrome (HCC)    a.  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 25, 2010    CEA   COLONOSCOPY WITH  PROPOFOL N/A 06/24/2021   Procedure: COLONOSCOPY WITH PROPOFOL;  Surgeon: Jeani Hawking, MD;  Location: [facility] ENDOSCOPY;  Service: Endoscopy;  Laterality: N/A;   CORONARY ANGIOPLASTY WITH STENT PLACEMENT  12/03/2013   LAD 90%-->0% W/ Promus Premier DES 3.0 mm x 16 mm, CFX OK, RCA 40%, EF 70-75%   LEFT ATRIAL APPENDAGE OCCLUSION N/A 08/05/2015   Procedure: LEFT ATRIAL APPENDAGE OCCLUSION;  Surgeon: [name], MD;  Location: [facility] INVASIVE CV LAB;  Service: Cardiovascular;  Laterality: N/A;   LEFT HEART CATH AND CORONARY ANGIOGRAPHY N/A 09/13/2021   Procedure: LEFT HEART CATH AND CORONARY ANGIOGRAPHY;  Surgeon: Orbie Pyo, MD;  Location: [facility] INV[facility] CV [facility];  Service: Cardiovascular;  Laterality: N/A;   LEFT HEART CATHETERIZATION WITH CORONARY ANGIOGRAM Left 12/03/2013   Procedure: LEFT HEART CATHETERIZATION WITH CORONARY ANGIOGRAM;  Surgeon: Marykay Lex, MD;  Location: [facility] CATH LAB;  Service: Cardiovascular;  Laterality: Left;   LEFT HEART CATHETERIZATION WITH CORONARY ANGIOGRAM N/A 01/26/2014   Procedure: LEFT HEART CATHETERIZATION WITH CORONARY ANGIOGRAM;  Surgeon: Corky Crafts, MD;  Location: [facility] CATH LAB;  Service: Cardiovascular;  Laterality: N/A;   LEFT HEART CATHETERIZATION WITH CORONARY ANGIOGRAM N/A 08/03/2014   Procedure: LEFT HEART CATHETERIZATION WITH CORONARY ANGIOGRAM;  Surgeon: Kathleene Hazel, MD;  Location: [facility] CATH LAB;  Service: Cardiovascular;  Laterality: N/A;   PERCUTANEOUS CORONARY STENT INTERVENTION (PCI-S)  12/03/2013   Procedure: PERCUTANEOUS CORONARY STENT INTERVENTION (PCI-S);  Surgeon: Marykay Lex, MD;  Location: [facility] CATH LAB;  Service: Cardiovascular;;   PERMANENT PACEMAKER INSERTION N/A 09/18/2014   Procedure: PERMANENT PACEMAKER INSERTION;  Surgeon: Marinus Maw, MD;  Location[facility] CATH LAB;  Service: Cardiovascular;  Laterality: N/A;   POLYPECTOMY  06/24/2021   Procedure: POLYPECTOMY;  Surgeon: Jeani Hawking, MD;  Location: [facility] ENDOSCOPY;  Service:  Endoscopy;;   RADIOFREQUENCY ABLATION  2005   for PSVT   TEE WITHOUT CARDIOVERSION N/A 07/27/2015   Procedure: TRANSESOPHAGEAL ECHOCARDIOGRAM (TEE);  Surgeon: Lewayne Bunting, MD;  Location: [facility] ENDOSCOPY;  Service: Cardiovascular;  Laterality: N/A;   TEE WITHOUT CARDIOVERSION N/A 09/15/2015   Procedure: TRANSESOPHAGEAL ECHOCARDIOGRAM (TEE);  Surgeon: Vesta Mixer, MD;  Location: [facility] ENDOSCOPY;  Service: Cardiovascular;  Laterality: N/A;    Allergies  Allergies  Allergen Reactions   Trazodone And Nefazodone     Nightmares   Lactose Intolerance (Gi) Other (See Comments)    UPSET STOMACH     Home Medications    Prior to Admission medications   Medication Sig Start Date End Date Taking? Authorizing Provider  allopurinol (ZYLOPRIM) 100 MG tablet TAKE 1 TABLET(100 MG) BY MOUTH DAILY 09/07/20   Donita Brooks, MD  apalutamide (ERLEADA) 60 MG tablet TAKE 4 TABLETS (240 MG TOTAL) BY MOUTH DAILY. MAY BE TAKEN WITH OR WITHOUT FOOD. SWALLOW TABLETS WHOLE. 10/23/22 10/23/23  [name], MD  aspirin EC 325 MG tablet Take 1 tablet (325 mg total) daily by mouth. 04/13/17   Alben Spittle,  Scott T, PA-C  Blood Glucose Monitoring Suppl (BLOOD GLUCOSE SYSTEM PAK) KIT Use as directed to monitor FSBS 1x daily. Dx: E11.9. 03/04/19   Salley Scarlet, MD  Cyanocobalamin (VITAMIN B 12) 500 MCG TABS Take 500 mcg by mouth in the morning.    [provider]  diclofenac Sodium (VOLTAREN) 1 % GEL 2 g 2 (two) times daily. 08/18/21   [provider]  FARXIGA 5 MG TABS tablet Take 5 mg by mouth daily. 11/14/21   [provider]  fluticasone (FLONASE) 50 MCG/ACT nasal spray Place 2 sprays into both nostrils daily. 06/02/20   Valentino Nose, NP  furosemide (LASIX) 20 MG tablet TAKE 3 TABLETS BY MOUTH DAILY 06/24/19   Marinus Maw, MD  glipiZIDE (GLUCOTROL XL) 5 MG 24 hr tablet Take 1 tablet (5 mg total) by mouth daily with breakfast. 10/10/21   Dani Gobble, NP  glucose blood  (ONETOUCH ULTRA) test strip Use as instructed to monitor glucose twice daily. 10/10/21   Dani Gobble, NP  isosorbide mononitrate (IMDUR) 30 MG 24 hr tablet Take 1 tablet (30 mg total) by mouth daily. 07/27/22   Tereso Newcomer T, PA-C  Lactulose 20 GM/30ML SOLN Take 30 mLs (20 g total) by mouth daily as needed. Take 30 ml by mouth every 3 hours until you have bowel movement then daily as needed 09/20/21   [name], MD  Lancets [facility] DELICA PLUS LANCET30G) MISC USE EVERY DAY FOR GLUCOSE TESTING 05/10/22   [provider]  levothyroxine (SYNTHROID) 200 MCG tablet TAKE 1 TABLET(200 MCG) BY MOUTH EVERY DAY BEFORE BREAKFAST 09/26/22   Dani Gobble, NP  levothyroxine (SYNTHROID) 25 MCG tablet Take 1 tablet (25 mcg total) by mouth See admin instructions. Takes along with 200 mcg to total 225 mcg daily 10/10/21   Dani Gobble, NP  loratadine (CLARITIN) 10 MG tablet TAKE 1 TABLET(10 MG) BY MOUTH DAILY AS NEEDED FOR ALLERGIES 04/19/20   Salley Scarlet, MD  magnesium oxide (MAG-OX) 400 (240 Mg) MG tablet TAKE 1 TABLET(400 MG) BY MOUTH TWICE DAILY 10/19/21   Marinus Maw, MD  metFORMIN (GLUCOPHAGE) 500 MG tablet Take 1 tablet (500 mg total) by mouth daily with breakfast. 10/10/21   Dani Gobble, NP  naloxone [facility]) nasal spray 4 mg/0.1 mL Place 4 mg into the nose once as needed (accidental overdose). 01/28/21   [provider]  nitroGLYCERIN (NITROSTAT) 0.4 MG SL tablet PLACE 1 TABLET UNDER THE TONGUE EVERY 5 MINUTES AS NEEDED FOR CHEST PAIN. CALL 911 AT THIRD DOSE IN 15 MINUTES 09/07/22   Sheilah Pigeon, PA-C  oxyCODONE-acetaminophen (PERCOCET) 10-325 MG tablet Take 1 tablet by mouth every 8 (eight) hours as needed for pain. 08/13/20   Salley Scarlet, MD  pantoprazole (PROTONIX) 40 MG tablet TAKE 1 TABLET(40 MG) BY MOUTH DAILY 07/19/20   [place], Velna Hatchet, MD  potassium chloride SA (KLOR-CON M) 20 MEQ tablet Take 1 tablet (20 mEq total) by mouth daily. 09/29/21    Parke Poisson, MD  rosuvastatin (CRESTOR) 40 MG tablet Take 40 mg by mouth every evening. 04/02/21   [provider]  silodosin (RAPAFLO) 8 MG CAPS capsule TAKE 1 CAPSULE BY MOUTH TWICE DAILY 05/04/22   Marcine Matar, MD  solifenacin (VESICARE) 10 MG tablet TAKE 1 TABLET(10 MG) BY MOUTH DAILY 05/02/22   Marcine Matar, MD  sotalol (BETAPACE) 80 MG tablet Take 1 tablet (80 mg total) by mouth 2 (two) times daily.  11/13/22   Marinus Maw, MD  Vibegron (GEMTESA) 75 MG TABS Take 1 tablet by mouth daily.    [provider]  Vitamin D, Ergocalciferol, (DRISDOL) 1.25 MG (50000 UNIT) CAPS capsule TAKE 1 CAPSULE BY MOUTH EVERY 7 DAYS 06/25/20   Salley Scarlet, MD    Physical Exam    Vital Signs:  ARMOUR SILVER does not have vital signs available for review today.  Given telephonic nature of communication, physical exam is limited. AAOx3. NAD. Normal affect.  Speech and respirations are unlabored.  Accessory Clinical Findings    None  Assessment & Plan    1.  Preoperative Cardiovascular Risk Assessment: According to the Revised Cardiac Risk Index (RCRI), his Perioperative Risk of Major Cardiac Event is (%): 0.9. His Functional Capacity in METs is: 6.61 according to the [facility] Activity Status Index (DASI). The patient is doing well from a cardiac perspective. Therefore, based on ACC/AHA guidelines, the patient would be at acceptable risk for the planned procedure without further cardiovascular testing.   The patient was advised that if he develops new symptoms prior to surgery to contact our office to arrange for a follow-up visit, and he verbalized understanding.  Per office protocol, he may hold aspirin for 7 days prior to procedure and should resume as soon as hemodynamically stable postoperatively.   A copy of this note will be routed to requesting surgeon.  Time:   Today, I have spent 10 minutes with the patient with telehealth technology discussing  medical history, symptoms, and management plan.    Levi Aland, NP-C  11/16/2022, 10:05 AM [address], [address] Office [phone] Fax [phone]

## 2022-11-16 ENCOUNTER — Ambulatory Visit: Payer: 59 | Attending: Cardiology | Admitting: Nurse Practitioner

## 2022-11-16 ENCOUNTER — Encounter: Payer: Self-pay | Admitting: Nurse Practitioner

## 2022-11-16 DIAGNOSIS — Z01810 Encounter for preprocedural cardiovascular examination: Secondary | ICD-10-CM | POA: Diagnosis not present

## 2022-11-20 ENCOUNTER — Inpatient Hospital Stay: Payer: 59

## 2022-11-20 ENCOUNTER — Inpatient Hospital Stay: Payer: 59 | Attending: Hematology

## 2022-11-20 VITALS — BP 131/65 | HR 64 | Temp 97.5°F | Resp 18 | Wt 287.8 lb

## 2022-11-20 DIAGNOSIS — Z806 Family history of leukemia: Secondary | ICD-10-CM | POA: Diagnosis not present

## 2022-11-20 DIAGNOSIS — Z8249 Family history of ischemic heart disease and other diseases of the circulatory system: Secondary | ICD-10-CM | POA: Insufficient documentation

## 2022-11-20 DIAGNOSIS — D649 Anemia, unspecified: Secondary | ICD-10-CM | POA: Diagnosis not present

## 2022-11-20 DIAGNOSIS — Z191 Hormone sensitive malignancy status: Secondary | ICD-10-CM

## 2022-11-20 DIAGNOSIS — Z79899 Other long term (current) drug therapy: Secondary | ICD-10-CM | POA: Diagnosis not present

## 2022-11-20 DIAGNOSIS — C61 Malignant neoplasm of prostate: Secondary | ICD-10-CM

## 2022-11-20 DIAGNOSIS — Z5111 Encounter for antineoplastic chemotherapy: Secondary | ICD-10-CM | POA: Diagnosis present

## 2022-11-20 DIAGNOSIS — Z833 Family history of diabetes mellitus: Secondary | ICD-10-CM | POA: Insufficient documentation

## 2022-11-20 DIAGNOSIS — D696 Thrombocytopenia, unspecified: Secondary | ICD-10-CM | POA: Diagnosis not present

## 2022-11-20 DIAGNOSIS — C778 Secondary and unspecified malignant neoplasm of lymph nodes of multiple regions: Secondary | ICD-10-CM | POA: Insufficient documentation

## 2022-11-20 DIAGNOSIS — Z8041 Family history of malignant neoplasm of ovary: Secondary | ICD-10-CM | POA: Insufficient documentation

## 2022-11-20 DIAGNOSIS — K7460 Unspecified cirrhosis of liver: Secondary | ICD-10-CM | POA: Insufficient documentation

## 2022-11-20 DIAGNOSIS — Z801 Family history of malignant neoplasm of trachea, bronchus and lung: Secondary | ICD-10-CM | POA: Diagnosis not present

## 2022-11-20 DIAGNOSIS — Z808 Family history of malignant neoplasm of other organs or systems: Secondary | ICD-10-CM | POA: Insufficient documentation

## 2022-11-20 DIAGNOSIS — Z800 Family history of malignant neoplasm of digestive organs: Secondary | ICD-10-CM | POA: Insufficient documentation

## 2022-11-20 DIAGNOSIS — M8580 Other specified disorders of bone density and structure, unspecified site: Secondary | ICD-10-CM | POA: Diagnosis not present

## 2022-11-20 DIAGNOSIS — Z87891 Personal history of nicotine dependence: Secondary | ICD-10-CM | POA: Diagnosis not present

## 2022-11-20 DIAGNOSIS — Z803 Family history of malignant neoplasm of breast: Secondary | ICD-10-CM | POA: Diagnosis not present

## 2022-11-20 LAB — COMPREHENSIVE METABOLIC PANEL
ALT: 12 U/L (ref 0–44)
AST: 24 U/L (ref 15–41)
Albumin: 3.7 g/dL (ref 3.5–5.0)
Alkaline Phosphatase: 92 U/L (ref 38–126)
Anion gap: 8 (ref 5–15)
BUN: 15 mg/dL (ref 8–23)
CO2: 22 mmol/L (ref 22–32)
Calcium: 9.1 mg/dL (ref 8.9–10.3)
Chloride: 109 mmol/L (ref 98–111)
Creatinine, Ser: 1.21 mg/dL (ref 0.61–1.24)
GFR, Estimated: >60 mL/min (ref >60)
Glucose, Bld: 149 mg/dL — ABNORMAL HIGH (ref 70–99)
Potassium: 3.7 mmol/L (ref 3.5–5.1)
Sodium: 139 mmol/L (ref 135–145)
Total Bilirubin: 0.8 mg/dL (ref 0.3–1.2)
Total Protein: 7.4 g/dL (ref 6.5–8.1)

## 2022-11-20 LAB — CBC WITH DIFFERENTIAL/PLATELET
Abs Immature Granulocytes: 0.02 10*3/uL (ref 0.00–0.07)
Basophils Absolute: 0.1 10*3/uL (ref 0.0–0.1)
Basophils Relative: 1 %
Eosinophils Absolute: 0.1 10*3/uL (ref 0.0–0.5)
Eosinophils Relative: 2 %
HCT: 38.1 % — ABNORMAL LOW (ref 39.0–52.0)
Hemoglobin: 12.4 g/dL — ABNORMAL LOW (ref 13.0–17.0)
Immature Granulocytes: 0 %
Lymphocytes Relative: 23 %
Lymphs Abs: 1.3 10*3/uL (ref 0.7–4.0)
MCH: 32.9 pg (ref 26.0–34.0)
MCHC: 32.5 g/dL (ref 30.0–36.0)
MCV: 101.1 fL — ABNORMAL HIGH (ref 80.0–100.0)
Monocytes Absolute: 0.6 10*3/uL (ref 0.1–1.0)
Monocytes Relative: 10 %
Neutro Abs: 3.6 10*3/uL (ref 1.7–7.7)
Neutrophils Relative %: 64 %
Platelets: 110 10*3/uL — ABNORMAL LOW (ref 150–400)
RBC: 3.77 MIL/uL — ABNORMAL LOW (ref 4.22–5.81)
RDW: 14.7 % (ref 11.5–15.5)
WBC: 5.7 10*3/uL (ref 4.0–10.5)
nRBC: 0 % (ref 0.0–0.2)

## 2022-11-20 LAB — IRON AND TIBC
Iron: 59 ug/dL (ref 45–182)
Saturation Ratios: 15 % — ABNORMAL LOW (ref 17.9–39.5)
TIBC: 407 ug/dL (ref 250–450)
UIBC: 348 ug/dL

## 2022-11-20 LAB — PSA: Prostatic Specific Antigen: <0.01 ng/mL (ref 0.00–4.00)

## 2022-11-20 LAB — FERRITIN: Ferritin: 44 ng/mL (ref 24–336)

## 2022-11-20 MED ORDER — DEGARELIX ACETATE 80 MG ~~LOC~~ SOLR
80.0000 mg | Freq: Once | SUBCUTANEOUS | Status: AC
  Administered 2022-11-20: 80 mg via SUBCUTANEOUS
  Filled 2022-11-20: qty 4

## 2022-11-20 NOTE — Patient Instructions (Signed)
[facility]  Discharge Instructions: Thank you for choosing [facility] to provide your oncology and hematology care.  If you have a lab appointment with the Cancer Center - please note that after April 8th, 2024, all labs will be drawn in the cancer center.  You do not have to check in or register with the main entrance as you have in the past but will complete your check-in in the cancer center.  Wear comfortable clothing and clothing appropriate for easy access to any Portacath or PICC line.   We strive to give you quality time with your provider. You may need to reschedule your appointment if you arrive late (15 or more minutes).  Arriving late affects you and other patients whose appointments are after yours.  Also, if you miss three or more appointments without notifying the office, you may be dismissed from the clinic at the provider's discretion.      For prescription refill requests, have your pharmacy contact our office and allow 72 hours for refills to be completed.    Today you received the following chemotherapy and/or immunotherapy agents Firmagon injection   To help prevent nausea and vomiting after your treatment, we encourage you to take your nausea medication as directed.  BELOW ARE SYMPTOMS THAT SHOULD BE REPORTED IMMEDIATELY: *FEVER GREATER THAN 100.4 F (38 C) OR HIGHER *CHILLS OR SWEATING *NAUSEA AND VOMITING THAT IS NOT CONTROLLED WITH YOUR NAUSEA MEDICATION *UNUSUAL SHORTNESS OF BREATH *UNUSUAL BRUISING OR BLEEDING *URINARY PROBLEMS (pain or burning when urinating, or frequent urination) *BOWEL PROBLEMS (unusual diarrhea, constipation, pain near the anus) TENDERNESS IN MOUTH AND THROAT WITH OR WITHOUT PRESENCE OF ULCERS (sore throat, sores in mouth, or a toothache) UNUSUAL RASH, SWELLING OR PAIN  UNUSUAL VAGINAL DISCHARGE OR ITCHING   Items with * indicate a potential emergency and should be followed up as soon as possible or go to  the Emergency Department if any problems should occur.  Please show the CHEMOTHERAPY ALERT CARD or IMMUNOTHERAPY ALERT CARD at check-in to the Emergency Department and triage nurse.  Should you have questions after your visit or need to cancel or reschedule your appointment, please contact [facility] CENTER AT [facility] [phone]  and follow the prompts.  Office hours are 8:00 a.m. to 4:30 p.m. Monday - Friday. Please note that voicemails left after 4:00 p.m. may not be returned until the following business day.  We are closed weekends and major holidays. You have access to a nurse at all times for urgent questions. Please call the main number to the clinic [phone] and follow the prompts.  For any non-urgent questions, you may also contact your provider using MyChart. We now offer e-Visits for anyone 1 and older to request care online for non-urgent symptoms. For details visit [email or web address].   Also download the MyChart app! Go to the app store, search "MyChart", open the app, select [place], and log in with your MyChart username and password.

## 2022-11-20 NOTE — Progress Notes (Signed)
Firmagon injection given per orders. Patient tolerated it well without problems. Vitals stable and discharged home from clinic ambulatory. Follow up as scheduled.  

## 2022-11-21 ENCOUNTER — Other Ambulatory Visit (HOSPITAL_COMMUNITY): Payer: Self-pay

## 2022-11-23 ENCOUNTER — Other Ambulatory Visit (HOSPITAL_COMMUNITY): Payer: Self-pay

## 2022-11-26 NOTE — Progress Notes (Signed)
[name] [address] [place] [address]    Clinic Day:  11/27/2022  Referring physician: Fleet Contras, MD  Patient Care Team: Fleet Contras, MD as PCP - General (Internal Medicine) Marinus Maw, MD as PCP - Cardiology (Cardiology) Marinus Maw, MD (Cardiology) Early, [name], MD as Attending Physician (Vascular Surgery) [name], MD as Consulting Physician (Endocrinology) [name], MD (Inactive) as Consulting Physician (Cardiology) [name], [facility] (Inactive) as Pharmacist (Pharmacist) [name], RN as Oncology Nurse Navigator (Oncology) [name], MD as Medical Oncologist (Medical Oncology)   ASSESSMENT & PLAN:   Assessment: 1.  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 -Status post IMRT, 40 sessions completed on 06/14/2016 by Dr. Kathrynn Running. -PSA 0.7 (10/15/2018), 0.8 (02/18/2019), 1.4 (December 2020), 1.4 (June 2021) -Prostate biopsy on 02/10/2020-1 out of 12 cores from left mid lateral region positive for adenocarcinoma. -CTAP on 03/09/2020 with newly enlarged left iliac lymph nodes measuring 1.1 x 0.9 cm.  Hepatic steatosis with early signs of cirrhosis. -F-18 PSMA PET scan on 04/12/2020 showed radiotracer activity associated with enlarged left external iliac lymph node 9 mm with SUV 7.6.  Small left common iliac lymph node 6 mm, SUV 8.6.  Left periaortic lymph node 5 mm, SUV 6.3.  Lymph node between IVC and aorta at the level of right renal hilum, SUV 8.6.  Lymph node deep to the IVC measuring 6 mm, SUV 5.4.  No skeletal metastasis. 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 - Firmagon started on 04/21/2020 - Apalutamide 240 milligrams daily started around 04/22/2020.   2.  Social/family history: -He is a retired Corporate investment banker.  He  lives at home with his wife.  He plays golf.  He quit smoking in 2015, smoked 1 pack/day for more than 20 years. -Sister has CML.  Maternal grandmother had breast cancer.  Maternal uncle had lung cancer.  2 maternal cousins had tongue cancer and uterine cancer.   3.  Cirrhosis: -Prior imaging showed cirrhosis of the liver with normal spleen.    Plan: 1.  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 - He is having abdominal pain at the site of injections of degarelix. - Reviewed labs today: Normal LFTs and creatinine.  PSA is undetectable. - Continue apalutamide 240 mg daily. - Will switch degarelix to Eligard 45 mg every 6 months starting next injection.   2.  Normocytic anemia: - He reports worsening fatigue.  Ferritin is 44 and percent saturation is 15 and hemoglobin is 12.4. - Recommend Feraheme weekly x 2.   3.  Mild thrombocytopenia: - Mild thrombocytopenia on and off since December 2020 has been stable.  Platelet count is 110 today.   4.  Osteopenia (DEXA 11/15/2021 T score -1.6): - He is taking vitamin D 50,000 units daily. - We have recommended Prolia to decrease his bone resorption from antiandrogens.  He will have dental extractions done in 2 weeks.  We will start him at next visit.    Orders Placed This Encounter  Procedures   CBC    Standing Status:   Future    Standing Expiration Date:   11/27/2023   Comprehensive metabolic panel    Standing Status:   Future    Standing Expiration Date:   11/27/2023    Order Specific  Question:   Release to patient    Answer:   Immediate   PSA    Standing Status:   Future    Standing Expiration Date:   11/27/2023   Ferritin    Standing Status:   Future    Standing Expiration Date:   11/27/2023    Order Specific Question:   Release to patient    Answer:   Immediate   Iron and TIBC    Standing Status:   Future    Standing Expiration Date:    11/27/2023    Order Specific Question:   Release to patient    Answer:   Immediate   VITAMIN D 25 Hydroxy (Vit-D Deficiency, Fractures)    Standing Status:   Future    Standing Expiration Date:   11/27/2023    Order Specific Question:   Release to patient    Answer:   Immediate      I,[name],acting as a scribe for [name], MD.,have documented all relevant documentation on the behalf of [name], MD,as directed by  [name], MD while in the presence of [name], MD.   I, [name] MD, have reviewed the above documentation for accuracy and completeness, and I agree with the above.   [name], MD   6/24/20245:05 PM  CHIEF COMPLAINT:   Diagnosis: metastatic castration sensitive prostate cancer    Cancer Staging  No matching staging information was found for the patient.   Prior Therapy: IMRT x 40 sessions completed on 06/14/2016   Current Therapy:  Firmagon every month; Erleada 240 mg daily    HISTORY OF PRESENT ILLNESS:   Oncology History  Prostate cancer (HCC)  03/10/2016 Initial Diagnosis   Prostate cancer (HCC)   05/23/2020 Genetic Testing   Negative genetic testing:  No pathogenic variants detected on the [facility] Common Hereditary Cancers Panel + Prostate Cancer HRR Panel. A variant of uncertain significance (VUS) was detected in the NF1 gene called c.1178A>G. The report date is 05/23/2020.  The Common Hereditary Cancers Panel offered by [facility] includes sequencing and/or deletion duplication testing of the following 47 genes: APC, ATM, AXIN2, BARD1, BMPR1A, BRCA1, BRCA2, BRIP1, CDH1, CDK4, CDKN2A (p14ARF), CDKN2A (p16INK4a), CHEK2, CTNNA1, DICER1, EPCAM (Deletion/duplication testing only), GREM1 (promoter region deletion/duplication testing only), KIT, MEN1, MLH1, MSH2, MSH3, MSH6, MUTYH, NBN, NF1, NTHL1, PALB2, PDGFRA, PMS2, POLD1, POLE, PTEN, RAD50, RAD51C, RAD51D, SDHB, SDHC, SDHD, SMAD4,  SMARCA4. STK11, TP53, TSC1, TSC2, and VHL.  The following genes were evaluated for sequence changes only: SDHA and HOXB13 c.251G>A variant only. The Prostate Cancer HRR Panel offered by [facility] includes sequencing and/or deletion duplication analysis of the following 10 genes: ATM, BARD1, BRCA1, BRCA2, BRIP1, CHEK2, FANCL, PALB2, RAD51C, RAD51D.      INTERVAL HISTORY:   Cruzito is a 67 y.o. male presenting to clinic today for follow up of metastatic castration sensitive prostate cancer. He was last seen by me on 08/22/22.  Today, he states that he is doing well overall. His appetite level is at 100%. His energy level is at 30%.  PAST MEDICAL HISTORY:   Past Medical History: Past Medical History:  Diagnosis Date   Arteriosclerotic cardiovascular disease (ASCVD) 2005   catheterization in 10/2010:50% mid LAD, diffuse distal disease, circumflex irregularities, large dominant RCA with a 50% ostial, 70% distal, 60% posterolateral and 70% PDA; normal EF   Arthritis    Benign prostatic hypertrophy    Bilateral carpal tunnel syndrome 07/03/2018   Cerebrovascular disease 2010   R.  carotid endarterectomy; Duplex in 10/2010-widely patent ICAs, subtotal left vertebral-not thought to be contributing to symptoms   Cervical spine disease    CT in 2012-advanced degeneration and spondylosis with moderate spinal stenosis at C3-C6   CHF (congestive heart failure) (HCC)    Depression    Diabetes mellitus without complication (HCC)    Erectile dysfunction    Family history of breast cancer    Family history of cancer of mouth    Family history of CML (chronic monocytic leukemia)    Family history of lung cancer    Family history of ovarian cancer    Family history of stomach cancer    Gastroesophageal reflux disease    H/O hiatal hernia    H/O: substance abuse (HCC)    Cocaine, marijuana, alcohol.  Quit 2013.    Hyperlipidemia    Hypertension    Non-ST elevation myocardial infarction (NSTEMI), initial  episode of care (HCC) 12/02/2013   DES LAD   Obesity    Prostate cancer (HCC)    Sleep apnea    CPAP   Tachy-brady syndrome (HCC)    a. 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 25, 2010    CEA   COLONOSCOPY WITH PROPOFOL N/A 06/24/2021   Procedure: COLONOSCOPY WITH PROPOFOL;  Surgeon: Jeani Hawking, MD;  Location: [facility] ENDOSCOPY;  Service: Endoscopy;  Laterality: N/A;   CORONARY ANGIOPLASTY WITH STENT PLACEMENT  12/03/2013   LAD 90%-->0% W/ Promus Premier DES 3.0 mm x 16 mm, CFX OK, RCA 40%, EF 70-75%   LEFT ATRIAL APPENDAGE OCCLUSION N/A 08/05/2015   Procedure: LEFT ATRIAL APPENDAGE OCCLUSION;  Surgeon: [name], MD;  Location: [facility] INVASIVE CV LAB;  Service: Cardiovascular;  Laterality: N/A;   LEFT HEART CATH AND CORONARY ANGIOGRAPHY N/A 09/13/2021   Procedure: LEFT HEART CATH AND CORONARY ANGIOGRAPHY;  Surgeon: Orbie Pyo, MD;  Location: [facility] INVASIVE CV LAB;  Service: Cardiovascular;  Laterality: N/A;   LEFT HEART CATHETERIZATION WITH CORONARY ANGIOGRAM Left 12/03/2013   Procedure: LEFT HEART CATHETERIZATION WITH CORONARY ANGIOGRAM;  Surgeon: Marykay Lex, MD;  Location: [facility] CATH LAB;  Service: Cardiovascular;  Laterality: Left;   LEFT HEART CATHETERIZATION WITH CORONARY ANGIOGRAM N/A 01/26/2014   Procedure: LEFT HEART CATHETERIZATION WITH CORONARY ANGIOGRAM;  Surgeon: Corky Crafts, MD;  Location: [facility] CATH LAB;  Service: Cardiovascular;  Laterality: N/A;   LEFT HEART CATHETERIZATION WITH CORONARY ANGIOGRAM N/A 08/03/2014   Procedure: LEFT HEART CATHETERIZATION WITH CORONARY ANGIOGRAM;  Surgeon:  Kathleene Hazel, MD;  Location: [facility] CATH LAB;  Service: Cardiovascular;  Laterality: N/A;   PERCUTANEOUS CORONARY STENT INTERVENTION (PCI-S)  12/03/2013   Procedure: PERCUTANEOUS CORONARY STENT INTERVENTION (PCI-S);  Surgeon: Marykay Lex, MD;  Location: [facility] CATH LAB;  Service: Cardiovascular;;   PERMANENT PACEMAKER INSERTION N/A 09/18/2014   Procedure: PERMANENT PACEMAKER INSERTION;  Surgeon: Marinus Maw, MD;  Location: [facility] CATH LAB;  Service: Cardiovascular;  Laterality: N/A;   POLYPECTOMY  06/24/2021   Procedure: POLYPECTOMY;  Surgeon: Jeani Hawking, MD;  Location: [facility] ENDOSCOPY;  Service:  Endoscopy;;   RADIOFREQUENCY ABLATION  2005   for PSVT   TEE WITHOUT CARDIOVERSION N/A 07/27/2015   Procedure: TRANSESOPHAGEAL ECHOCARDIOGRAM (TEE);  Surgeon: Lewayne Bunting, MD;  Location: [facility] ENDOSCOPY;  Service: Cardiovascular;  Laterality: N/A;   TEE WITHOUT CARDIOVERSION N/A 09/15/2015   Procedure: TRANSESOPHAGEAL ECHOCARDIOGRAM (TEE);  Surgeon: Vesta Mixer, MD;  Location: [facility] ENDOSCOPY;  Service: Cardiovascular;  Laterality: N/A;    Social History: Social History   Socioeconomic History   Marital status: Married    Spouse name: Not on file   Number of children: 0   Years of education: Not on file   Highest education level: Not on file  Occupational History   Occupation: Retired  Tobacco Use   Smoking status: Former    Packs/day: 1.00    Years: 40.00    Additional pack years: 0.00    Total pack years: 40.00    Types: Cigarettes    Start date: 10/20/1972    Quit date: 10/10/2012    Years since quitting: 10.1   Smokeless tobacco: Never   Tobacco comments:    Quit in May.   Vaping Use   Vaping Use: Never used  Substance and Sexual Activity   Alcohol use: Yes    Alcohol/week: 0.0 standard drinks of alcohol    Comment: former drinker-- sober since 2013.    Drug use: No    Types: Cocaine    Comment: quit cocaine 10/2011   Sexual activity: Yes    Partners: Female  Other Topics  Concern   Not on file  Social History Narrative   Lives in [place].   Social Determinants of Health   Financial Resource Strain: Low Risk  (04/19/2020)   Overall Financial Resource Strain (CARDIA)    Difficulty of Paying Living Expenses: Not hard at all  Food Insecurity: No Food Insecurity (04/19/2020)   Hunger Vital Sign    Worried About Running Out of Food in the Last Year: Never true    Ran Out of Food in the Last Year: Never true  Transportation Needs: No Transportation Needs (04/19/2020)   PRAPARE - Administrator, Civil Service (Medical): No    Lack of Transportation (Non-Medical): No  Physical Activity: Inactive (04/19/2020)   Exercise Vital Sign    Days of Exercise per Week: 0 days    Minutes of Exercise per Session: 0 min  Stress: No Stress Concern Present (04/19/2020)   [employer] of Occupational Health - Occupational Stress Questionnaire    Feeling of Stress : Not at all  Social Connections: Moderately Isolated (04/19/2020)   Social Connection and Isolation Panel [NHANES]    Frequency of Communication with Friends and Family: More than three times a week    Frequency of Social Gatherings with Friends and Family: Once a week    Attends Religious Services: Never    Database administrator or Organizations: No    Attends Banker Meetings: Never    Marital Status: Married  Catering manager Violence: Not At Risk (04/19/2020)   Humiliation, Afraid, Rape, and Kick questionnaire    Fear of Current or Ex-Partner: No    Emotionally Abused: No    Physically Abused: No    Sexually Abused: No    Family History: Family History  Problem Relation Age of Onset   Hypertension Mother        Cerebrovascular disease   Diabetes Mother    Coronary artery disease Father 80   Diabetes type II  Father    Hypertension Father    Heart attack Father    Diabetes Brother    Hypertension Brother    Diabetes Sister    Hypertension Sister    Heart  attack Sister 60   Leukemia Sister 81       CML   Breast cancer Maternal Grandmother        dx 54s   Lung cancer Maternal Uncle        dx >50, smoker   Cancer Cousin        mouth cancer, dx 38s, no smoking/chew tobacco hx (maternal 1st cousin)   Ovarian cancer Cousin        dx <50 (maternal 1st cousin)   Stomach cancer Cousin        dx 66s (maternal 1st cousin)   Cancer Cousin        type unknown to pt, dx >50 (paternal 1st cousin)    Current Medications:  Current Outpatient Medications:    allopurinol (ZYLOPRIM) 100 MG tablet, TAKE 1 TABLET(100 MG) BY MOUTH DAILY, Disp: 90 tablet, Rfl: 0   apalutamide (ERLEADA) 60 MG tablet, TAKE 4 TABLETS (240 MG TOTAL) BY MOUTH DAILY. MAY BE TAKEN WITH OR WITHOUT FOOD. SWALLOW TABLETS WHOLE., Disp: 120 tablet, Rfl: 3   aspirin EC 325 MG tablet, Take 1 tablet (325 mg total) daily by mouth., Disp: 30 tablet, Rfl: 0   Blood Glucose Monitoring Suppl (BLOOD GLUCOSE SYSTEM PAK) KIT, Use as directed to monitor FSBS 1x daily. Dx: E11.9., Disp: 1 kit, Rfl: 1   Cyanocobalamin (VITAMIN B 12) 500 MCG TABS, Take 500 mcg by mouth in the morning., Disp: , Rfl:    diclofenac Sodium (VOLTAREN) 1 % GEL, 2 g 2 (two) times daily., Disp: , Rfl:    FARXIGA 5 MG TABS tablet, Take 5 mg by mouth daily., Disp: , Rfl:    fluticasone (FLONASE) 50 MCG/ACT nasal spray, Place 2 sprays into both nostrils daily., Disp: 16 g, Rfl: 6   furosemide (LASIX) 20 MG tablet, TAKE 3 TABLETS BY MOUTH DAILY, Disp: 270 tablet, Rfl: 3   glipiZIDE (GLUCOTROL XL) 5 MG 24 hr tablet, Take 1 tablet (5 mg total) by mouth daily with breakfast., Disp: 90 tablet, Rfl: 3   glucose blood (ONETOUCH ULTRA) test strip, Use as instructed to monitor glucose twice daily., Disp: 100 strip, Rfl: 3   isosorbide mononitrate (IMDUR) 30 MG 24 hr tablet, Take 1 tablet (30 mg total) by mouth daily., Disp: 90 tablet, Rfl: 3   Lactulose 20 GM/30ML SOLN, Take 30 mLs (20 g total) by mouth daily as needed. Take 30 ml by  mouth every 3 hours until you have bowel movement then daily as needed, Disp: 450 mL, Rfl: 3   Lancets (ONETOUCH DELICA PLUS LANCET30G) MISC, USE EVERY DAY FOR GLUCOSE TESTING, Disp: , Rfl:    levothyroxine (SYNTHROID) 200 MCG tablet, TAKE 1 TABLET(200 MCG) BY MOUTH EVERY DAY BEFORE BREAKFAST, Disp: 90 tablet, Rfl: 1   levothyroxine (SYNTHROID) 25 MCG tablet, Take 1 tablet (25 mcg total) by mouth See admin instructions. Takes along with 200 mcg to total 225 mcg daily, Disp: 90 tablet, Rfl: 3   loratadine (CLARITIN) 10 MG tablet, TAKE 1 TABLET(10 MG) BY MOUTH DAILY AS NEEDED FOR ALLERGIES, Disp: 30 tablet, Rfl: 2   magnesium oxide (MAG-OX) 400 (240 Mg) MG tablet, TAKE 1 TABLET(400 MG) BY MOUTH TWICE DAILY, Disp: 180 tablet, Rfl: 2   metFORMIN (GLUCOPHAGE) 500 MG tablet, Take 1 tablet (  500 mg total) by mouth daily with breakfast., Disp: 90 tablet, Rfl: 3   naloxone (NARCAN) nasal spray 4 mg/0.1 mL, Place 4 mg into the nose once as needed (accidental overdose)., Disp: , Rfl:    nitroGLYCERIN (NITROSTAT) 0.4 MG SL tablet, PLACE 1 TABLET UNDER THE TONGUE EVERY 5 MINUTES AS NEEDED FOR CHEST PAIN. CALL 911 AT THIRD DOSE IN 15 MINUTES, Disp: 25 tablet, Rfl: 11   oxyCODONE-acetaminophen (PERCOCET) 10-325 MG tablet, Take 1 tablet by mouth every 8 (eight) hours as needed for pain., Disp: 90 tablet, Rfl: 0   pantoprazole (PROTONIX) 40 MG tablet, TAKE 1 TABLET(40 MG) BY MOUTH DAILY, Disp: 30 tablet, Rfl: 3   potassium chloride SA (KLOR-CON M) 20 MEQ tablet, Take 1 tablet (20 mEq total) by mouth daily., Disp: 30 tablet, Rfl: 3   rosuvastatin (CRESTOR) 40 MG tablet, Take 40 mg by mouth every evening., Disp: , Rfl:    silodosin (RAPAFLO) 8 MG CAPS capsule, TAKE 1 CAPSULE BY MOUTH TWICE DAILY, Disp: 180 capsule, Rfl: 3   solifenacin (VESICARE) 10 MG tablet, TAKE 1 TABLET(10 MG) BY MOUTH DAILY, Disp: 30 tablet, Rfl: 11   sotalol (BETAPACE) 80 MG tablet, Take 1 tablet (80 mg total) by mouth 2 (two) times daily., Disp:  180 tablet, Rfl: 3   Vibegron (GEMTESA) 75 MG TABS, Take 1 tablet by mouth daily., Disp: , Rfl:    Vitamin D, Ergocalciferol, (DRISDOL) 1.25 MG (50000 UNIT) CAPS capsule, TAKE 1 CAPSULE BY MOUTH EVERY 7 DAYS, Disp: 12 capsule, Rfl: 0   Allergies: Allergies  Allergen Reactions   Trazodone And Nefazodone     Nightmares   Lactose Intolerance (Gi) Other (See Comments)    UPSET STOMACH     REVIEW OF SYSTEMS:   Review of Systems  Constitutional:  Positive for fatigue. Negative for chills and fever.  HENT:   Negative for lump/mass, mouth sores, nosebleeds, sore throat and trouble swallowing.   Eyes:  Negative for eye problems.  Respiratory:  Negative for cough and shortness of breath.   Cardiovascular:  Negative for chest pain, leg swelling and palpitations.  Gastrointestinal:  Positive for constipation. Negative for abdominal pain, diarrhea, nausea and vomiting.  Genitourinary:  Negative for bladder incontinence, difficulty urinating, dysuria, frequency, hematuria and nocturia.   Musculoskeletal:  Negative for arthralgias, back pain, flank pain, myalgias and neck pain.  Skin:  Negative for itching and rash.  Neurological:  Positive for headaches. Negative for dizziness and numbness.  Hematological:  Does not bruise/bleed easily.  Psychiatric/Behavioral:  Negative for depression, sleep disturbance and suicidal ideas. The patient is not nervous/anxious.   All other systems reviewed and are negative.    VITALS:   Blood pressure 130/80, pulse 64, temperature 98.8 F (37.1 C), temperature source Tympanic, resp. rate 18, weight 286 lb 8 oz (130 kg), SpO2 98 %.  Wt Readings from Last 3 Encounters:  11/27/22 286 lb 8 oz (130 kg)  11/20/22 287 lb 12.8 oz (130.5 kg)  10/29/22 288 lb (130.6 kg)    Body mass index is 39.96 kg/m.  Performance status (ECOG): 1 - Symptomatic but completely ambulatory  PHYSICAL EXAM:   Physical Exam Vitals and nursing note reviewed. Exam conducted with a  chaperone present.  Constitutional:      Appearance: Normal appearance.  Cardiovascular:     Rate and Rhythm: Normal rate and regular rhythm.     Pulses: Normal pulses.     Heart sounds: Normal heart sounds.  Pulmonary:  Effort: Pulmonary effort is normal.     Breath sounds: Normal breath sounds.  Abdominal:     Palpations: Abdomen is soft. There is no hepatomegaly, splenomegaly or mass.     Tenderness: There is no abdominal tenderness.  Musculoskeletal:     Right lower leg: No edema.     Left lower leg: No edema.  Lymphadenopathy:     Cervical: No cervical adenopathy.     Right cervical: No superficial, deep or posterior cervical adenopathy.    Left cervical: No superficial, deep or posterior cervical adenopathy.     Upper Body:     Right upper body: No supraclavicular or axillary adenopathy.     Left upper body: No supraclavicular or axillary adenopathy.  Neurological:     General: No focal deficit present.     Mental Status: He is alert and oriented to person, place, and time.  Psychiatric:        Mood and Affect: Mood normal.        Behavior: Behavior normal.     LABS:      Latest Ref Rng & Units 11/20/2022    2:05 PM 10/29/2022    9:59 AM 08/21/2022   10:00 AM  CBC  WBC 4.0 - 10.5 K/uL 5.7  4.8  6.7   Hemoglobin 13.0 - 17.0 g/dL 16.1  09.6  04.5   Hematocrit 39.0 - 52.0 % 38.1  33.0  35.9   Platelets 150 - 400 K/uL 110  102  131       Latest Ref Rng & Units 11/20/2022    2:05 PM 10/29/2022    9:59 AM 08/21/2022   10:00 AM  CMP  Glucose 70 - 99 mg/dL 409  811  914   BUN 8 - 23 mg/dL 15  9  20    Creatinine 0.61 - 1.24 mg/dL 7.82  9.56  2.13   Sodium 135 - 145 mmol/L 139  138  140   Potassium 3.5 - 5.1 mmol/L 3.7  3.1  3.4   Chloride 98 - 111 mmol/L 109  111  113   CO2 22 - 32 mmol/L 22  18  19    Calcium 8.9 - 10.3 mg/dL 9.1  8.4  9.1   Total Protein 6.5 - 8.1 g/dL 7.4   7.6   Total Bilirubin 0.3 - 1.2 mg/dL 0.8   0.6   Alkaline Phos 38 - 126 U/L 92   91    AST 15 - 41 U/L 24   19   ALT 0 - 44 U/L 12   9      No results found for: "CEA1", "CEA" / No results found for: "CEA1", "CEA" No results found for: "PSA1" No results found for: "YQM578" No results found for: "CAN125"  No results found for: "TOTALPROTELP", "ALBUMINELP", "A1GS", "A2GS", "BETS", "BETA2SER", "GAMS", "MSPIKE", "SPEI" Lab Results  Component Value Date   TIBC 407 11/20/2022   TIBC 451 (H) 08/21/2022   TIBC 486 (H) 02/28/2022   FERRITIN 44 11/20/2022   FERRITIN 25 08/21/2022   FERRITIN 19 (L) 02/28/2022   IRONPCTSAT 15 (L) 11/20/2022   IRONPCTSAT 14 (L) 08/21/2022   IRONPCTSAT 10 (L) 02/28/2022   No results found for: "LDH"   STUDIES:   CUP PACEART REMOTE DEVICE CHECK  Result Date: 11/08/2022 Scheduled remote reviewed. Normal device function.  Next remote 91 days. [facility], [facility].  CT Head Wo Contrast  Result Date: 10/29/2022 CLINICAL DATA:  Left-sided headache.  History of subdural hemorrhage EXAM:  CT HEAD WITHOUT CONTRAST TECHNIQUE: Contiguous axial images were obtained from the base of the skull through the vertex without intravenous contrast. RADIATION DOSE REDUCTION: This exam was performed according to the departmental dose-optimization program which includes automated exposure control, adjustment of the mA and/or kV according to patient size and/or use of iterative reconstruction technique. COMPARISON:  09/13/2021 FINDINGS: Brain: No evidence of acute infarction, hemorrhage, hydrocephalus, extra-axial collection or mass lesion/mass effect. 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 Vascular: No hyperdense vessel or unexpected calcification. Skull: Burr holes on the right with history of subdural hemorrhage. Sinuses/Orbits: No acute finding. IMPRESSION: No acute or interval finding. Electronically Signed   By: Tiburcio Pea M.D.   On: 10/29/2022 10:05

## 2022-11-27 ENCOUNTER — Inpatient Hospital Stay (HOSPITAL_BASED_OUTPATIENT_CLINIC_OR_DEPARTMENT_OTHER): Payer: 59 | Admitting: Hematology

## 2022-11-27 ENCOUNTER — Other Ambulatory Visit (HOSPITAL_COMMUNITY): Payer: Self-pay

## 2022-11-27 VITALS — BP 130/80 | HR 64 | Temp 98.8°F | Resp 18 | Wt 286.5 lb

## 2022-11-27 DIAGNOSIS — D649 Anemia, unspecified: Secondary | ICD-10-CM | POA: Diagnosis not present

## 2022-11-27 DIAGNOSIS — Z191 Hormone sensitive malignancy status: Secondary | ICD-10-CM

## 2022-11-27 DIAGNOSIS — C61 Malignant neoplasm of prostate: Secondary | ICD-10-CM

## 2022-11-27 DIAGNOSIS — Z5111 Encounter for antineoplastic chemotherapy: Secondary | ICD-10-CM | POA: Diagnosis not present

## 2022-11-27 NOTE — Patient Instructions (Addendum)
[facility]  Discharge Instructions  You were seen and examined today by Dr. Ellin Saba.  Dr. Ellin Saba discussed your most recent lab work and CT scan which revealed that everything looks good and stable except your iron is low.  Dr. Ellin Saba is going to schedule you for IV iron infusions. Continue taking the Erleada as prescribed. Dr. Ellin Saba is going to start you on Eligard 45 mg injection to be given every 6 months. The Eligard will replace the monthly injections you had been getting.  Follow-up as scheduled.    Thank you for choosing [facility] to provide your oncology and hematology care.   To afford each patient quality time with our provider, please arrive at least 15 minutes before your scheduled appointment time. You may need to reschedule your appointment if you arrive late (10 or more minutes). Arriving late affects you and other patients whose appointments are after yours.  Also, if you miss three or more appointments without notifying the office, you may be dismissed from the clinic at the provider's discretion.    Again, thank you for choosing [facility].  Our hope is that these requests will decrease the amount of time that you wait before being seen by our physicians.   If you have a lab appointment with the Cancer Center - please note that after April 8th, all labs will be drawn in the cancer center.  You do not have to check in or register with the main entrance as you have in the past but will complete your check-in at the cancer center.            _____________________________________________________________  Should you have questions after your visit to [facility], please contact our office at [phone] and follow the prompts.  Our office hours are 8:00 a.m. to 4:30 p.m. Monday - Thursday and 8:00 a.m. to 2:30 p.m. Friday.  Please note that voicemails left after 4:00 p.m. may  not be returned until the following business day.  We are closed weekends and all major holidays.  You do have access to a nurse 24-7, just call the main number to the clinic [phone] and do not press any options, hold on the line and a nurse will answer the phone.    For prescription refill requests, have your pharmacy contact our office and allow 72 hours.    Masks are no longer required in the cancer centers. If you would like for your care team to wear a mask while they are taking care of you, please let them know. You may have one support person who is at least 67 years old accompany you for your appointments.

## 2022-11-29 ENCOUNTER — Telehealth: Payer: Self-pay | Admitting: *Deleted

## 2022-11-29 NOTE — Telephone Encounter (Signed)
Our office received a dental clearance request for the pt. We have left messages with the dental office for clarification on request.   In review of the chart it seems this may be a duplicate request. Our office received the same request on 11/06/22. The pt then was cleared by [name], NP on 11/16/22 with the notes stating how long to hold ASA. I will fax these notes to their office as it may be that they did not receive the notes.   If this is just a duplicate request then their office does not need to cal back. However if this is for a new procedure please update our office.

## 2022-11-30 ENCOUNTER — Inpatient Hospital Stay: Payer: 59

## 2022-11-30 ENCOUNTER — Telehealth: Payer: Self-pay | Admitting: Nurse Practitioner

## 2022-11-30 VITALS — BP 107/54 | HR 66 | Temp 97.6°F | Resp 18

## 2022-11-30 DIAGNOSIS — Z191 Hormone sensitive malignancy status: Secondary | ICD-10-CM

## 2022-11-30 DIAGNOSIS — Z5111 Encounter for antineoplastic chemotherapy: Secondary | ICD-10-CM | POA: Diagnosis not present

## 2022-11-30 MED ORDER — ACETAMINOPHEN 325 MG PO TABS
650.0000 mg | ORAL_TABLET | Freq: Once | ORAL | Status: AC
  Administered 2022-11-30: 650 mg via ORAL
  Filled 2022-11-30: qty 2

## 2022-11-30 MED ORDER — SODIUM CHLORIDE 0.9 % IV SOLN
510.0000 mg | Freq: Once | INTRAVENOUS | Status: AC
  Administered 2022-11-30: 510 mg via INTRAVENOUS
  Filled 2022-11-30: qty 17

## 2022-11-30 MED ORDER — SODIUM CHLORIDE 0.9 % IV SOLN: Freq: Once | INTRAVENOUS | Status: AC

## 2022-11-30 MED ORDER — CETIRIZINE HCL 10 MG PO TABS
10.0000 mg | ORAL_TABLET | Freq: Once | ORAL | Status: AC
  Administered 2022-11-30: 10 mg via ORAL
  Filled 2022-11-30: qty 1

## 2022-11-30 NOTE — Telephone Encounter (Signed)
See clearance note for further info

## 2022-11-30 NOTE — Telephone Encounter (Addendum)
Peter Dunlap with Dr. April, DDS office called about pt's clearance. I stated that I had refaxed notes yesterday. Peter Dunlap did tell me that it is 101 teeth now instead of 5 teeth that will need to be extracted. I did tell Peter Dunlap that [name], NP did clear the pt on 11/16/22 and provided in her ov notes ok to hold ASA x 7 days prior to procedure. Peter Dunlap asked if I could re-fax the notes again to the fax # on the clearance form [phone].   11/29/22 Our office received a dental clearance request for the pt. We have left messages with the dental office for clarification on request.    In review of the chart it seems this may be a duplicate request. Our office received the same request on 11/06/22. The pt then was cleared by [name], NP on 11/16/22 with the notes stating how long to hold ASA. I will fax these notes to their office as it may be that they did not receive the notes.    If this is just a duplicate request then their office does not need to cal back. However if this is for a new procedure please update our office.

## 2022-11-30 NOTE — Progress Notes (Signed)
Patient presents today for Feraheme infusion per providers order.  Vital signs WNL.  Patient has no new complaints at this time.  Peripheral IV started and blood return noted pre and post infusion.  Stable during infusion without adverse affects.  Vital signs stable.  No complaints at this time.  Discharge from clinic ambulatory in stable condition.  Alert and oriented X 3.  Follow up with [facility] as scheduled.  

## 2022-11-30 NOTE — Telephone Encounter (Signed)
Follow Up:          Peter Dunlap is calling back to update clearance information.

## 2022-11-30 NOTE — Patient Instructions (Signed)
[facility] [place]  Discharge Instructions: Thank you for choosing [facility] to provide your oncology and hematology care.  If you have a lab appointment with the Cancer Center - please note that after April 8th, 2024, all labs will be drawn in the cancer center.  You do not have to check in or register with the main entrance as you have in the past but will complete your check-in in the cancer center.  Wear comfortable clothing and clothing appropriate for easy access to any Portacath or PICC line.   We strive to give you quality time with your provider. You may need to reschedule your appointment if you arrive late (15 or more minutes).  Arriving late affects you and other patients whose appointments are after yours.  Also, if you miss three or more appointments without notifying the office, you may be dismissed from the clinic at the provider's discretion.      For prescription refill requests, have your pharmacy contact our office and allow 72 hours for refills to be completed.    Today you received the following chemotherapy and/or immunotherapy agents Feraheme      To help prevent nausea and vomiting after your treatment, we encourage you to take your nausea medication as directed.  BELOW ARE SYMPTOMS THAT SHOULD BE REPORTED IMMEDIATELY: *FEVER GREATER THAN 100.4 F (38 C) OR HIGHER *CHILLS OR SWEATING *NAUSEA AND VOMITING THAT IS NOT CONTROLLED WITH YOUR NAUSEA MEDICATION *UNUSUAL SHORTNESS OF BREATH *UNUSUAL BRUISING OR BLEEDING *URINARY PROBLEMS (pain or burning when urinating, or frequent urination) *BOWEL PROBLEMS (unusual diarrhea, constipation, pain near the anus) TENDERNESS IN MOUTH AND THROAT WITH OR WITHOUT PRESENCE OF ULCERS (sore throat, sores in mouth, or a toothache) UNUSUAL RASH, SWELLING OR PAIN  UNUSUAL VAGINAL DISCHARGE OR ITCHING   Items with * indicate a potential emergency and should be followed up as soon as possible or go to the  Emergency Department if any problems should occur.  Please show the CHEMOTHERAPY ALERT CARD or IMMUNOTHERAPY ALERT CARD at check-in to the Emergency Department and triage nurse.  Should you have questions after your visit or need to cancel or reschedule your appointment, please contact [facility] AT  [phone]  and follow the prompts.  Office hours are 8:00 a.m. to 4:30 p.m. Monday - Friday. Please note that voicemails left after 4:00 p.m. may not be returned until the following business day.  We are closed weekends and major holidays. You have access to a nurse at all times for urgent questions. Please call the main number to the clinic [phone] and follow the prompts.  For any non-urgent questions, you may also contact your provider using MyChart. We now offer e-Visits for anyone 18 and older to request care online for non-urgent symptoms. For details visit [email or web address].   Also download the MyChart app! Go to the app store, search "MyChart", open the app, select [place], and log in with your MyChart username and password.   

## 2022-12-05 NOTE — Progress Notes (Signed)
Remote pacemaker transmission.   

## 2022-12-06 ENCOUNTER — Telehealth: Payer: Self-pay

## 2022-12-06 ENCOUNTER — Other Ambulatory Visit: Payer: Self-pay

## 2022-12-06 DIAGNOSIS — E890 Postprocedural hypothyroidism: Secondary | ICD-10-CM

## 2022-12-06 MED ORDER — LEVOTHYROXINE SODIUM 25 MCG PO TABS
25.0000 ug | ORAL_TABLET | ORAL | 0 refills | Status: DC
Start: 2022-12-06 — End: 2023-02-22

## 2022-12-06 NOTE — Telephone Encounter (Signed)
Left a message requesting pt's wife to return call to office.

## 2022-12-08 ENCOUNTER — Inpatient Hospital Stay: Payer: 59

## 2022-12-08 MED FILL — Ferumoxytol Inj 510 MG/17ML (30 MG/ML) (Elemental Fe): INTRAVENOUS | Qty: 17 | Status: AC

## 2022-12-11 ENCOUNTER — Other Ambulatory Visit: Payer: Self-pay | Admitting: Nurse Practitioner

## 2022-12-11 DIAGNOSIS — E1169 Type 2 diabetes mellitus with other specified complication: Secondary | ICD-10-CM

## 2022-12-15 ENCOUNTER — Inpatient Hospital Stay: Payer: 59 | Attending: Hematology

## 2022-12-15 VITALS — BP 105/68 | HR 60 | Temp 97.2°F | Resp 16 | Wt 285.0 lb

## 2022-12-15 DIAGNOSIS — Z79899 Other long term (current) drug therapy: Secondary | ICD-10-CM | POA: Diagnosis not present

## 2022-12-15 DIAGNOSIS — C61 Malignant neoplasm of prostate: Secondary | ICD-10-CM | POA: Insufficient documentation

## 2022-12-15 DIAGNOSIS — Z5111 Encounter for antineoplastic chemotherapy: Secondary | ICD-10-CM | POA: Insufficient documentation

## 2022-12-15 MED ORDER — SODIUM CHLORIDE 0.9 % IV SOLN
510.0000 mg | Freq: Once | INTRAVENOUS | Status: AC
  Administered 2022-12-15: 510 mg via INTRAVENOUS
  Filled 2022-12-15: qty 510

## 2022-12-15 MED ORDER — ACETAMINOPHEN 325 MG PO TABS
650.0000 mg | ORAL_TABLET | Freq: Once | ORAL | Status: AC
  Administered 2022-12-15: 650 mg via ORAL
  Filled 2022-12-15: qty 2

## 2022-12-15 MED ORDER — CETIRIZINE HCL 10 MG PO TABS
10.0000 mg | ORAL_TABLET | Freq: Once | ORAL | Status: AC
  Administered 2022-12-15: 10 mg via ORAL
  Filled 2022-12-15: qty 1

## 2022-12-15 MED ORDER — SODIUM CHLORIDE 0.9 % IV SOLN: Freq: Once | INTRAVENOUS | Status: AC

## 2022-12-15 NOTE — Patient Instructions (Signed)
[facility]  Discharge Instructions: Thank you for choosing [facility] to provide your oncology and hematology care.  If you have a lab appointment with the Cancer Center - please note that after April 8th, 2024, all labs will be drawn in the cancer center.  You do not have to check in or register with the main entrance as you have in the past but will complete your check-in in the cancer center.  Wear comfortable clothing and clothing appropriate for easy access to any Portacath or PICC line.   We strive to give you quality time with your provider. You may need to reschedule your appointment if you arrive late (15 or more minutes).  Arriving late affects you and other patients whose appointments are after yours.  Also, if you miss three or more appointments without notifying the office, you may be dismissed from the clinic at the provider's discretion.      For prescription refill requests, have your pharmacy contact our office and allow 72 hours for refills to be completed.    Today you received the following feraheme infusion   To help prevent nausea and vomiting after your treatment, we encourage you to take your nausea medication as directed.  BELOW ARE SYMPTOMS THAT SHOULD BE REPORTED IMMEDIATELY: *FEVER GREATER THAN 100.4 F (38 C) OR HIGHER *CHILLS OR SWEATING *NAUSEA AND VOMITING THAT IS NOT CONTROLLED WITH YOUR NAUSEA MEDICATION *UNUSUAL SHORTNESS OF BREATH *UNUSUAL BRUISING OR BLEEDING *URINARY PROBLEMS (pain or burning when urinating, or frequent urination) *BOWEL PROBLEMS (unusual diarrhea, constipation, pain near the anus) TENDERNESS IN MOUTH AND THROAT WITH OR WITHOUT PRESENCE OF ULCERS (sore throat, sores in mouth, or a toothache) UNUSUAL RASH, SWELLING OR PAIN  UNUSUAL VAGINAL DISCHARGE OR ITCHING   Items with * indicate a potential emergency and should be followed up as soon as possible or go to the Emergency Department if any problems  should occur.  Please show the CHEMOTHERAPY ALERT CARD or IMMUNOTHERAPY ALERT CARD at check-in to the Emergency Department and triage nurse.  Should you have questions after your visit or need to cancel or reschedule your appointment, please contact [facility] [phone]  and follow the prompts.  Office hours are 8:00 a.m. to 4:30 p.m. Monday - Friday. Please note that voicemails left after 4:00 p.m. may not be returned until the following business day.  We are closed weekends and major holidays. You have access to a nurse at all times for urgent questions. Please call the main number to the clinic [phone] and follow the prompts.  For any non-urgent questions, you may also contact your provider using MyChart. We now offer e-Visits for anyone 41 and older to request care online for non-urgent symptoms. For details visit [email or web address].   Also download the MyChart app! Go to the app store, search "MyChart", open the app, select [place], and log in with your MyChart username and password.

## 2022-12-15 NOTE — Progress Notes (Signed)
Feraheme infusion given per orders. Patient tolerated it well without problems. Vitals stable and discharged home from clinic ambulatory. Follow up as scheduled.  

## 2022-12-18 ENCOUNTER — Other Ambulatory Visit: Payer: Self-pay | Admitting: Internal Medicine

## 2022-12-19 LAB — VITAMIN B12: Vitamin B-12: 1525 pg/mL — ABNORMAL HIGH (ref 200–1100)

## 2022-12-19 LAB — BASIC METABOLIC PANEL WITH GFR
BUN/Creatinine Ratio: 10 (calc) (ref 6–22)
BUN: 14 mg/dL (ref 7–25)
CO2: 22 mmol/L (ref 20–32)
Calcium: 9.4 mg/dL (ref 8.6–10.3)
Chloride: 108 mmol/L (ref 98–110)
Creat: 1.42 mg/dL — ABNORMAL HIGH (ref 0.70–1.35)
Glucose, Bld: 122 mg/dL — ABNORMAL HIGH (ref 65–99)
Potassium: 3.2 mmol/L — ABNORMAL LOW (ref 3.5–5.3)
Sodium: 142 mmol/L (ref 135–146)
eGFR: 54 mL/min/{1.73_m2} — ABNORMAL LOW (ref >=60)

## 2022-12-19 LAB — T4, FREE: Free T4: 1.2 ng/dL (ref 0.8–1.8)

## 2022-12-19 LAB — MAGNESIUM: Magnesium: 2.4 mg/dL (ref 1.5–2.5)

## 2022-12-19 LAB — FOLATE: Folate: 10.2 ng/mL

## 2022-12-19 LAB — HEMOGLOBIN A1C W/OUT EAG: Hgb A1c MFr Bld: 6 % of total Hgb — ABNORMAL HIGH (ref <5.7)

## 2022-12-19 LAB — TSH: TSH: 2.23 mIU/L (ref 0.40–4.50)

## 2022-12-21 ENCOUNTER — Other Ambulatory Visit (HOSPITAL_COMMUNITY): Payer: Self-pay

## 2022-12-21 ENCOUNTER — Inpatient Hospital Stay: Payer: 59

## 2022-12-21 VITALS — BP 105/55 | HR 60 | Temp 97.9°F | Resp 18

## 2022-12-21 DIAGNOSIS — Z5111 Encounter for antineoplastic chemotherapy: Secondary | ICD-10-CM | POA: Diagnosis not present

## 2022-12-21 DIAGNOSIS — C61 Malignant neoplasm of prostate: Secondary | ICD-10-CM

## 2022-12-21 MED ORDER — LEUPROLIDE ACETATE (6 MONTH) 45 MG ~~LOC~~ KIT
45.0000 mg | PACK | Freq: Once | SUBCUTANEOUS | Status: AC
  Administered 2022-12-21: 45 mg via SUBCUTANEOUS
  Filled 2022-12-21: qty 45

## 2022-12-21 NOTE — Progress Notes (Signed)
Patient tolerated Eligard injection with no complaints voiced.  Site clean and dry with no bruising or swelling noted.  No complaints of pain.  Discharged with vital signs stable and no signs or symptoms of distress noted.

## 2022-12-21 NOTE — Patient Instructions (Signed)
[facility]  Discharge Instructions: Thank you for choosing [facility] to provide your oncology and hematology care.  If you have a lab appointment with the Cancer Center - please note that after April 8th, 2024, all labs will be drawn in the cancer center.  You do not have to check in or register with the main entrance as you have in the past but will complete your check-in in the cancer center.  Wear comfortable clothing and clothing appropriate for easy access to any Portacath or PICC line.   We strive to give you quality time with your provider. You may need to reschedule your appointment if you arrive late (15 or more minutes).  Arriving late affects you and other patients whose appointments are after yours.  Also, if you miss three or more appointments without notifying the office, you may be dismissed from the clinic at the provider's discretion.      For prescription refill requests, have your pharmacy contact our office and allow 72 hours for refills to be completed.    Today you received the following chemotherapy and/or immunotherapy agents Firmagon.Degarelix Injection What is this medication? DEGARELIX (deg a REL ix) treats prostate cancer. It works by decreasing levels of the hormone testosterone in the body. This prevents prostate cancer cells from spreading or growing. It belongs to a group of medications called GnRH blockers. This medicine may be used for other purposes; ask your health care provider or pharmacist if you have questions. COMMON BRAND NAME(S): Degarelix, [name] What should I tell my care team before I take this medication? They need to know if you have any of these conditions: Diabetes Heart disease Kidney disease Liver disease Low levels of potassium or magnesium in the blood Osteoporosis, weak bones An unusual or allergic reaction to degarelix, mannitol, other medications, foods, dyes, or preservatives If you or your  partner are pregnant or trying to get pregnant How should I use this medication? This medication is injected under the skin. It is usually given by your care team in a hospital or clinic setting. It may also be given at home. If you get this medication at home, you will be taught how to prepare and give it. Use exactly as directed. Take it as directed on the prescription label at the same time every day. Keep taking it unless your care team tells you to stop. It is important that you put your used needles and syringes in a special sharps container. Do not put them in a trash can. If you do not have a sharps container, call your pharmacist or care team to get one. Talk to your care team about the use of this medication in children. Special care may be needed. Overdosage: If you think you have taken too much of this medicine contact a poison control center or emergency room at once. NOTE: This medicine is only for you. Do not share this medicine with others. What if I miss a dose? If you get this medication at the hospital or clinic: It is important not to miss your dose. Call your care team if you are unable to keep an appointment. If you give yourself this medication at home: If you miss a dose, take it as soon as you can. If it is almost time for your next dose, take only that dose. Do not take double or extra doses. Call your care team with questions. What may interact with this medication? Do not take  this medication with any of the following: Cisapride Dronedarone Pimozide Thioridazine This medication may also interact with the following: Other medications that cause heart rhythm changes This list may not describe all possible interactions. Give your health care provider a list of all the medicines, herbs, non-prescription drugs, or dietary supplements you use. Also tell them if you smoke, drink alcohol, or use illegal drugs. Some items may interact with your medicine. What should I watch for  while using this medication? Your condition will be monitored carefully while you are receiving this medication. You may need blood work while taking this medication. Do not rub or scratch injection site. There may be a lump at the injection site, or it may be red or sore for a few days after your dose. This medication may cause infertility. Talk to your care team if you are concerned about your fertility. What side effects may I notice from receiving this medication? Side effects that you should report to your care team as soon as possible: Allergic reactions or angioedema--skin rash, itching or hives, swelling of the face, eyes, lips, tongue, arms, or legs, trouble swallowing or breathing Heart rhythm changes--fast or irregular heartbeat, dizziness, feeling faint or lightheaded, chest pain, trouble breathing Side effects that usually do not require medical attention (report to your care team if they continue or are bothersome): Hot flashes Pain, redness, or irritation at injection site Weight gain This list may not describe all possible side effects. Call your doctor for medical advice about side effects. You may report side effects to FDA at 1-800-FDA-1088. Where should I keep my medication? Keep out of the reach of children and pets. This medication is usually given in a hospital or clinic and will not be stored at home. In rare cases, this medication may be given at home. If you are using this medication at home, you will be instructed on how to store this medication. Get rid of any unused medication after the expiration date. To get rid of medications that are no longer needed or have expired: Take the medication to a medication take-back program. Check with your pharmacy or law enforcement to find a location. If you cannot return the medication, ask your pharmacist or care team how to get rid of this medication safely. NOTE: This sheet is a summary. It may not cover all possible  information. If you have questions about this medicine, talk to your doctor, pharmacist, or health care provider.  2024 Elsevier/Gold Standard (2021-10-13 00:00:00)       To help prevent nausea and vomiting after your treatment, we encourage you to take your nausea medication as directed.  BELOW ARE SYMPTOMS THAT SHOULD BE REPORTED IMMEDIATELY: *FEVER GREATER THAN 100.4 F (38 C) OR HIGHER *CHILLS OR SWEATING *NAUSEA AND VOMITING THAT IS NOT CONTROLLED WITH YOUR NAUSEA MEDICATION *UNUSUAL SHORTNESS OF BREATH *UNUSUAL BRUISING OR BLEEDING *URINARY PROBLEMS (pain or burning when urinating, or frequent urination) *BOWEL PROBLEMS (unusual diarrhea, constipation, pain near the anus) TENDERNESS IN MOUTH AND THROAT WITH OR WITHOUT PRESENCE OF ULCERS (sore throat, sores in mouth, or a toothache) UNUSUAL RASH, SWELLING OR PAIN  UNUSUAL VAGINAL DISCHARGE OR ITCHING   Items with * indicate a potential emergency and should be followed up as soon as possible or go to the Emergency Department if any problems should occur.  Please show the CHEMOTHERAPY ALERT CARD or IMMUNOTHERAPY ALERT CARD at check-in to the Emergency Department and triage nurse.  Should you have questions after your visit or need  to cancel or reschedule your appointment, please contact [facility] [phone]  and follow the prompts.  Office hours are 8:00 a.m. to 4:30 p.m. Monday - Friday. Please note that voicemails left after 4:00 p.m. may not be returned until the following business day.  We are closed weekends and major holidays. You have access to a nurse at all times for urgent questions. Please call the main number to the clinic [phone] and follow the prompts.  For any non-urgent questions, you may also contact your provider using MyChart. We now offer e-Visits for anyone 80 and older to request care online for non-urgent symptoms. For details visit [email or web address].   Also download the MyChart  app! Go to the app store, search "MyChart", open the app, select , and log in with your MyChart username and password.

## 2022-12-26 ENCOUNTER — Telehealth: Payer: Self-pay

## 2022-12-26 NOTE — Telephone Encounter (Signed)
Patient wife called in today stating that she would like the stage of cancer the patient has. Wife states that [facility] need to know the stage of cancer. Wife would like a detailed message left on answer machine if she do not answer.Wife is aware a message will be sent to Dr. Retta Diones and someone will reach out with Dr. Retta Diones response. Wife voiced understanding

## 2022-12-27 ENCOUNTER — Other Ambulatory Visit (HOSPITAL_COMMUNITY): Payer: Self-pay

## 2022-12-28 NOTE — Telephone Encounter (Signed)
Patient is made aware Stage T4a. Patient would like to know exactly what this mean, clarification. Patient/wife is aware a message will be sent to the MD for clarification. Patient/wife voiced understanding

## 2023-01-01 NOTE — Telephone Encounter (Signed)
Tried calling Patient/wife with no answer, left voice message for return call. Per Dr. 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

## 2023-01-02 NOTE — Telephone Encounter (Signed)
Return call back to patient's wife Irving Burton with out an answer , left voiced message for return call.

## 2023-01-09 NOTE — Progress Notes (Unsigned)
History of Present Illness: Peter Dunlap is here today for follow-up of prostate cancer and BPH with symptoms:    Prostate cancer:    He underwent TRUS/Bx on 8.15.2017. At that time, PSA was 9.09. Prostatic volume was 25 cc. 10/12 cores came back positive for adenocarcinoma as follows: 1 core revealed GS 3+3 5 cores revealed GS 3+4 4 cores revealed GS 4+3   He completed IMRT, 40 sessions, on 1.10.2018   8.24.2021: Repeat TRUS/Bx for elevating PSA trend following definitive Rx. 1/12 cores-from the left mid lateral region-came back positive for adenocarcinoma.  Grading was difficult due to his prior radiation.     10.15.2021: -CT A/P  with newly enlarged left iliac lymph nodes measuring 1.1 x 0.9 cm.  Hepatic steatosis with early signs of cirrhosis.   04/12/2020 -F-18 PSMA PET scan on showed radiotracer activity associated with enlarged left external iliac lymph node 9 mm with SUV 7.6.  Small left common iliac lymph node 6 mm, SUV 8.6.  Left periaortic lymph node 5 mm, SUV 6.3.  Lymph node between IVC and aorta at the level of right renal hilum, SUV 8.6.  Lymph node deep to the IVC measuring 6 mm, SUV 5.4.  No skeletal metastasis.   11.17.2021: Began ADT with Peter Dunlap.   7.5.2022:  He is still on Peter Dunlap  Most recent PSA is less than 0.1.  Testosterone level 5.   11.9.2022: PSA < 0.01. T level castrate.  Still on monthly Firmagon 80 mg as well as Erleada.     7.18.2023: PSA is 0.01, testosterone level still castrate.  Continues on Peter Dunlap.  He does complain of shortness of breath.  Most recent hemoglobin 8.9.  He did get iron infusion x2 in late summer 2023.   11.21.2023: Most recent PSA in September less than 0.01, nadir level for him.  He did experience hematuria in September-passed a clot, did have frequency and urgency.  Urine culture grew beta-hemolytic strep.  He has not had recurrent gross hematuria or dysuria.      BPH   7.18.2023: He has been  treated for BPH w/ LUTS w/ silodosin q 12 hrs.  Additionally, he apparently has been on Solifenacin although he is not familiar with this medication at the present time.  He is complaining mainly about his lower urinary tract symptoms.  IPSS 26, quality-of-life score 3.  Residual urine volume today 52 mL. I recommended that he make sure that he is taking the solifenacin. Recommended that he limit caffeine intake and pm fluids.   8.29.2023: He is on dual therapy including Solifenacin daily and Rapaflo twice a day.  Still having nocturia x4-5.  Has not limited his evening fluids, however.    5.21.2024: PSA 2 months ago was still undetectable.  Still with urinary frequency, urgency and urgency incontinence as well as nocturia.  On Rapaflo twice a day as well as Solifenacin 10 mg.  No gross hematuria, no dysuria.  He does complain about pain at his injection site from Peter Dunlap.  He gets this monthly.   8.7.2024: Peter Dunlap comes in today for routine follow-up.  He was switched to leuprolide 45 mg from his monthly Firmagon on the 18th of last month.  He is much more comfortable with this.  Most recent PSA was undetectable.  At his last visit he was started on Gemtesa in addition to his Solifenacin.  This has really improved his urination.  He does have occasional drops of blood at the initial  part of his stream.  No total hematuria, however. Past Medical History:  Diagnosis Date   Arteriosclerotic cardiovascular disease (ASCVD) 2005   catheterization in 10/2010:50% mid LAD, diffuse distal disease, circumflex irregularities, large dominant RCA with a 50% ostial, 70% distal, 60% posterolateral and 70% PDA; normal EF   Arthritis    Benign prostatic hypertrophy    Bilateral carpal tunnel syndrome 07/03/2018   Cerebrovascular disease 2010   R. carotid endarterectomy; Duplex in 10/2010-widely patent ICAs, subtotal left vertebral-not thought to be contributing to symptoms   Cervical spine disease    CT in  2012-advanced degeneration and spondylosis with moderate spinal stenosis at C3-C6   CHF (congestive heart failure) (HCC)    Depression    Diabetes mellitus without complication (HCC)    Erectile dysfunction    Family history of breast cancer    Family history of cancer of mouth    Family history of CML (chronic monocytic leukemia)    Family history of lung cancer    Family history of ovarian cancer    Family history of stomach cancer    Gastroesophageal reflux disease    H/O hiatal hernia    H/O: substance abuse (HCC)    Cocaine, marijuana, alcohol.  Quit 2013.    Hyperlipidemia    Hypertension    Non-ST elevation myocardial infarction (NSTEMI), initial episode of care (HCC) 12/02/2013   DES LAD   Obesity    Prostate cancer (HCC)    Sleep apnea    CPAP   Tachy-brady syndrome (HCC)    a. 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 25, 2010    CEA   COLONOSCOPY WITH PROPOFOL N/A 06/24/2021   Procedure: COLONOSCOPY WITH PROPOFOL;  Surgeon: Jeani Hawking, MD;  Location: [facility] ENDOSCOPY;  Service: Endoscopy;  Laterality: N/A;   CORONARY ANGIOPLASTY WITH STENT PLACEMENT  12/03/2013   LAD 90%-->0% W/ Promus Premier DES 3.0 mm x 16 mm, CFX OK, RCA 40%, EF 70-75%   LEFT ATRIAL APPENDAGE OCCLUSION N/A 08/05/2015   Procedure: LEFT ATRIAL APPENDAGE OCCLUSION;  Surgeon: [name], MD;  Location: [facility];  Service: Cardiovascular;  Laterality: N/A;   LEFT HEART CATH AND CORONARY ANGIOGRAPHY N/A 09/13/2021   Procedure: LEFT HEART CATH AND CORONARY ANGIOGRAPHY;  Surgeon: Orbie Pyo, MD;  Location: [facility];   Service: Cardiovascular;  Laterality: N/A;   LEFT HEART CATHETERIZATION WITH CORONARY ANGIOGRAM Left 12/03/2013   Procedure: LEFT HEART CATHETERIZATION WITH CORONARY ANGIOGRAM;  Surgeon: Marykay Lex, MD;  Location: [facility] CATH LAB;  Service: Cardiovascular;  Laterality: Left;   LEFT HEART CATHETERIZATION WITH CORONARY ANGIOGRAM N/A 01/26/2014   Procedure: LEFT HEART CATHETERIZATION WITH CORONARY ANGIOGRAM;  Surgeon: Corky Crafts, MD;  Location: [facility] CATH LAB;  Service: Cardiovascular;  Laterality: N/A;   LEFT HEART CATHETERIZATION WITH CORONARY ANGIOGRAM N/A 08/03/2014   Procedure: LEFT HEART CATHETERIZATION WITH CORONARY ANGIOGRAM;  Surgeon: Kathleene Hazel, MD;  Location: [facility] CATH LAB;  Service: Cardiovascular;  Laterality: N/A;   PERCUTANEOUS CORONARY STENT INTERVENTION (PCI-S)  12/03/2013   Procedure: PERCUTANEOUS CORONARY STENT INTERVENTION (PCI-S);  Surgeon: Marykay Lex, MD;  Location: [facility] CATH LAB;  Service: Cardiovascular;;   PERMANENT PACEMAKER INSERTION N/A 09/18/2014   Procedure: PERMANENT PACEMAKER INSERTION;  Surgeon: Marinus Maw, MD;  Location: [facility] C[facility] LAB;  Service: Cardiovascular;  Laterality: N/A;   POLYPECTOMY  06/24/2021   Procedure: POLYPECTOMY;  Surgeon: Jeani Hawking, MD;  Location: [facility] ENDOSCOPY;  Service: Endoscopy;;   RADIOFREQUENCY ABLATION  2005   for PSVT   TEE WITHOUT CARDIOVERSION N/A 07/27/2015   Procedure: TRANSESOPHAGEAL ECHOCARDIOGRAM (TEE);  Surgeon: Lewayne Bunting, MD;  Location: [facility] ENDOSCOPY;  Service: Cardiovascular;  Laterality: N/A;   TEE WITHOUT CARDIOVERSION N/A 09/15/2015   Procedure: TRANSESOPHAGEAL ECHOCARDIOGRAM (TEE);  Surgeon: Vesta Mixer, MD;  Location: [facility] ENDOSCOPY;  Service: Cardiovascular;  Laterality: N/A;    Home Medications:  Allergies as of 01/10/2023       Reactions   Trazodone And Nefazodone    Nightmares   Lactose Intolerance (gi) Other (See Comments)   UPSET STOMACH        Medication List        Accurate as of  January 09, 2023  5:02 PM. If you have any questions, ask your nurse or doctor.          allopurinol 100 MG tablet Commonly known as: ZYLOPRIM TAKE 1 TABLET(100 MG) BY MOUTH DAILY   aspirin EC 325 MG tablet Take 1 tablet (325 mg total) daily by mouth.   Blood Glucose System Pak Kit Use as directed to monitor FSBS 1x daily. Dx: E11.9.   diclofenac Sodium 1 % Gel Commonly known as: VOLTAREN 2 g 2 (two) times daily.   Erleada 60 MG tablet Generic drug: apalutamide TAKE 4 TABLETS (240 MG TOTAL) BY MOUTH DAILY. MAY BE TAKEN WITH OR WITHOUT FOOD. SWALLOW TABLETS WHOLE.   Farxiga 5 MG Tabs tablet Generic drug: dapagliflozin propanediol Take 5 mg by mouth daily.   fluticasone 50 MCG/ACT nasal spray Commonly known as: FLONASE Place 2 sprays into both nostrils daily.   furosemide 20 MG tablet Commonly known as: LASIX TAKE 3 TABLETS BY MOUTH DAILY   Gemtesa 75 MG Tabs Generic drug: Vibegron Take 1 tablet by mouth daily.   glipiZIDE 5 MG 24 hr tablet Commonly known as: GLUCOTROL XL TAKE 1 TABLET(5 MG) BY MOUTH DAILY WITH BREAKFAST   isosorbide mononitrate 30 MG 24 hr tablet Commonly known as: IMDUR Take 1 tablet (30 mg total) by mouth daily.   Lactulose 20 GM/30ML Soln Take 30 mLs (20 g total) by mouth daily as needed. Take 30 ml by mouth every 3 hours until you have bowel movement then daily as needed   levothyroxine 200 MCG tablet Commonly known as: SYNTHROID TAKE 1 TABLET(200 MCG) BY MOUTH EVERY DAY BEFORE BREAKFAST   levothyroxine 25 MCG tablet Commonly known as: SYNTHROID Take 1 tablet (25 mcg total) by mouth See admin instructions. Takes along with 200 mcg to total 225 mcg daily   loratadine 10 MG tablet Commonly known as: CLARITIN TAKE 1 TABLET(10 MG) BY MOUTH DAILY AS NEEDED FOR ALLERGIES   magnesium oxide 400 (240 Mg) MG tablet Commonly known as: MAG-OX TAKE 1 TABLET(400 MG) BY MOUTH TWICE DAILY   metFORMIN 500 MG tablet Commonly known as:  GLUCOPHAGE Take 1 tablet (500 mg total) by mouth daily with breakfast.   naloxone 4 MG/0.1ML Liqd nasal spray kit Commonly known as: NARCAN Place 4 mg into the nose once as needed (accidental  overdose).   nitroGLYCERIN 0.4 MG SL tablet Commonly known as: NITROSTAT PLACE 1 TABLET UNDER THE TONGUE EVERY 5 MINUTES AS NEEDED FOR CHEST PAIN. CALL 911 AT THIRD DOSE IN 15 MINUTES   OneTouch Delica Plus Lancet30G Misc USE EVERY DAY FOR GLUCOSE TESTING   OneTouch Ultra test strip Generic drug: glucose blood Use as instructed to monitor glucose twice daily.   oxyCODONE-acetaminophen 10-325 MG tablet Commonly known as: PERCOCET Take 1 tablet by mouth every 8 (eight) hours as needed for pain.   pantoprazole 40 MG tablet Commonly known as: PROTONIX TAKE 1 TABLET(40 MG) BY MOUTH DAILY   potassium chloride SA 20 MEQ tablet Commonly known as: KLOR-CON M Take 1 tablet (20 mEq total) by mouth daily.   rosuvastatin 40 MG tablet Commonly known as: CRESTOR Take 40 mg by mouth every evening.   silodosin 8 MG Caps capsule Commonly known as: RAPAFLO TAKE 1 CAPSULE BY MOUTH TWICE DAILY   solifenacin 10 MG tablet Commonly known as: VESICARE TAKE 1 TABLET(10 MG) BY MOUTH DAILY   sotalol 80 MG tablet Commonly known as: BETAPACE Take 1 tablet (80 mg total) by mouth 2 (two) times daily.   Vitamin B 12 500 MCG Tabs Take 500 mcg by mouth in the morning.   Vitamin D (Ergocalciferol) 1.25 MG (50000 UNIT) Caps capsule Commonly known as: DRISDOL TAKE 1 CAPSULE BY MOUTH EVERY 7 DAYS        Allergies:  Allergies  Allergen Reactions   Trazodone And Nefazodone     Nightmares   Lactose Intolerance (Gi) Other (See Comments)    UPSET STOMACH     Family History  Problem Relation Age of Onset   Hypertension Mother        Cerebrovascular disease   Diabetes Mother    Coronary artery disease Father 20   Diabetes type II Father    Hypertension Father    Heart attack Father    Diabetes  Brother    Hypertension Brother    Diabetes Sister    Hypertension Sister    Heart attack Sister 70   Leukemia Sister 31       CML   Breast cancer Maternal Grandmother        dx 67s   Lung cancer Maternal Uncle        dx >50, smoker   Cancer Cousin        mouth cancer, dx 89s, no smoking/chew tobacco hx (maternal 1st cousin)   Ovarian cancer Cousin        dx <50 (maternal 1st cousin)   Stomach cancer Cousin        dx 53s (maternal 1st cousin)   Cancer Cousin        type unknown to pt, dx >50 (paternal 1st cousin)    Social History:  reports that he quit smoking about 10 years ago. His smoking use included cigarettes. He started smoking about 50 years ago. He has a 40 pack-year smoking history. He has never used smokeless tobacco. He reports current alcohol use. He reports that he does not use drugs.  ROS: A complete review of systems was performed.  All systems are negative except for pertinent findings as noted.  Physical Exam:  Vital signs in last 24 hours: There were no vitals taken for this visit. Constitutional:  Alert and oriented, No acute distress Cardiovascular: Regular rate  Respiratory: Normal respiratory effort Neurologic: Grossly intact, no focal deficits Psychiatric: Normal mood and affect  I have reviewed prior  pt notes  I have reviewed notes from referring/previous physicians--Dr. Ellin Saba  I have reviewed urinalysis results- trichomonas seen today  I have independently reviewed prior imaging  I have reviewed prior PSA results    Impression/Assessment:  1.  Trichomonas  2.  Castrate sensitive metastatic prostate cancer, on leuprolide endocrine needed, doing quite well  3.  Lower urinary tract symptoms, on Rapaflo, Gemtesa samples as well as Solifenacin, doing well  Plan:  1.  He was put on single dose of metronidazole  2.  More samples of Gemtesa given, but he can take these every other day or on Monday Wednesday and Friday  3.  I will see  back in about 3 months to recheck

## 2023-01-10 ENCOUNTER — Ambulatory Visit (INDEPENDENT_AMBULATORY_CARE_PROVIDER_SITE_OTHER): Payer: 59 | Admitting: Urology

## 2023-01-10 ENCOUNTER — Other Ambulatory Visit: Payer: Self-pay | Admitting: "Endocrinology

## 2023-01-10 ENCOUNTER — Encounter: Payer: Self-pay | Admitting: Urology

## 2023-01-10 VITALS — BP 109/70 | HR 69

## 2023-01-10 DIAGNOSIS — A599 Trichomoniasis, unspecified: Secondary | ICD-10-CM | POA: Diagnosis not present

## 2023-01-10 DIAGNOSIS — E890 Postprocedural hypothyroidism: Secondary | ICD-10-CM

## 2023-01-10 DIAGNOSIS — R350 Frequency of micturition: Secondary | ICD-10-CM

## 2023-01-10 DIAGNOSIS — N401 Enlarged prostate with lower urinary tract symptoms: Secondary | ICD-10-CM | POA: Diagnosis not present

## 2023-01-10 DIAGNOSIS — C61 Malignant neoplasm of prostate: Secondary | ICD-10-CM

## 2023-01-10 DIAGNOSIS — R351 Nocturia: Secondary | ICD-10-CM

## 2023-01-10 DIAGNOSIS — Z191 Hormone sensitive malignancy status: Secondary | ICD-10-CM

## 2023-01-10 LAB — URINALYSIS, ROUTINE W REFLEX MICROSCOPIC
Bilirubin, UA: NEGATIVE
Glucose, UA: NEGATIVE
Ketones, UA: NEGATIVE
Nitrite, UA: NEGATIVE
Specific Gravity, UA: 1.025 (ref 1.005–1.030)
Urobilinogen, Ur: 1 mg/dL (ref 0.2–1.0)
pH, UA: 6 (ref 5.0–7.5)

## 2023-01-10 LAB — MICROSCOPIC EXAMINATION

## 2023-01-10 MED ORDER — METRONIDAZOLE 500 MG PO TABS
ORAL_TABLET | ORAL | 0 refills | Status: DC
Start: 2023-01-10 — End: 2023-06-08

## 2023-01-18 ENCOUNTER — Other Ambulatory Visit (HOSPITAL_COMMUNITY): Payer: Self-pay

## 2023-01-22 ENCOUNTER — Other Ambulatory Visit (HOSPITAL_COMMUNITY): Payer: Self-pay

## 2023-01-23 ENCOUNTER — Encounter: Payer: Self-pay | Admitting: Internal Medicine

## 2023-01-23 ENCOUNTER — Ambulatory Visit: Payer: 59 | Attending: Internal Medicine | Admitting: Internal Medicine

## 2023-01-23 VITALS — BP 88/50 | HR 62 | Ht 72.0 in | Wt 289.0 lb

## 2023-01-23 DIAGNOSIS — I495 Sick sinus syndrome: Secondary | ICD-10-CM

## 2023-01-23 DIAGNOSIS — I480 Paroxysmal atrial fibrillation: Secondary | ICD-10-CM | POA: Diagnosis not present

## 2023-01-23 LAB — CUP PACEART INCLINIC DEVICE CHECK
Battery Remaining Longevity: 39 mo
Battery Voltage: 2.93 V
Brady Statistic RA Percent Paced: 32 %
Brady Statistic RV Percent Paced: 2.4 %
Date Time Interrogation Session: 20240820203102
Implantable Lead Connection Status: 753985
Implantable Lead Connection Status: 753985
Implantable Lead Implant Date: 20160415
Implantable Lead Implant Date: 20160415
Implantable Lead Location: 753859
Implantable Lead Location: 753860
Implantable Pulse Generator Implant Date: 20160415
Lead Channel Impedance Value: 425 Ohm
Lead Channel Impedance Value: 600 Ohm
Lead Channel Pacing Threshold Amplitude: 0.75 V
Lead Channel Pacing Threshold Amplitude: 0.75 V
Lead Channel Pacing Threshold Amplitude: 0.75 V
Lead Channel Pacing Threshold Amplitude: 0.75 V
Lead Channel Pacing Threshold Pulse Width: 0.5 ms
Lead Channel Pacing Threshold Pulse Width: 0.5 ms
Lead Channel Pacing Threshold Pulse Width: 0.5 ms
Lead Channel Pacing Threshold Pulse Width: 0.5 ms
Lead Channel Sensing Intrinsic Amplitude: 12 mV
Lead Channel Sensing Intrinsic Amplitude: 3.4 mV
Lead Channel Setting Pacing Amplitude: 2 V
Lead Channel Setting Pacing Amplitude: 2.5 V
Lead Channel Setting Pacing Pulse Width: 0.5 ms
Lead Channel Setting Sensing Sensitivity: 2 mV
Pulse Gen Model: 2240
Pulse Gen Serial Number: 7756161

## 2023-01-23 NOTE — Progress Notes (Signed)
HPI [name] returns today for followup. He has sinus node dysfunction, s/p PPM insertion, PAF, atrial tachycardia, obesity, CNS bleed, s/p Watchman. He also has prostate CA and undergone treatment. He denies chest pain or sob. No edema. No syncope. Rare palpitations. He has lost another 6 lbs since his last visit.  Allergies  Allergen Reactions   Trazodone And Nefazodone     Nightmares   Lactose Intolerance (Gi) Other (See Comments)    UPSET STOMACH      Current Outpatient Medications  Medication Sig Dispense Refill   allopurinol (ZYLOPRIM) 100 MG tablet TAKE 1 TABLET(100 MG) BY MOUTH DAILY 90 tablet 0   apalutamide (ERLEADA) 60 MG tablet TAKE 4 TABLETS (240 MG TOTAL) BY MOUTH DAILY. MAY BE TAKEN WITH OR WITHOUT FOOD. SWALLOW TABLETS WHOLE. 120 tablet 3   aspirin EC 325 MG tablet Take 1 tablet (325 mg total) daily by mouth. 30 tablet 0   Blood Glucose Monitoring Suppl (BLOOD GLUCOSE SYSTEM PAK) KIT Use as directed to monitor FSBS 1x daily. Dx: E11.9. 1 kit 1   Cyanocobalamin (VITAMIN B 12) 500 MCG TABS Take 500 mcg by mouth in the morning.     diclofenac Sodium (VOLTAREN) 1 % GEL 2 g 2 (two) times daily.     FARXIGA 5 MG TABS tablet Take 5 mg by mouth daily.     fluticasone (FLONASE) 50 MCG/ACT nasal spray Place 2 sprays into both nostrils daily. 16 g 6   furosemide (LASIX) 20 MG tablet TAKE 3 TABLETS BY MOUTH DAILY 270 tablet 3   glipiZIDE (GLUCOTROL XL) 5 MG 24 hr tablet TAKE 1 TABLET(5 MG) BY MOUTH DAILY WITH BREAKFAST 90 tablet 0   glucose blood (ONETOUCH ULTRA) test strip Use as instructed to monitor glucose twice daily. 100 strip 3   isosorbide mononitrate (IMDUR) 30 MG 24 hr tablet Take 1 tablet (30 mg total) by mouth daily. 90 tablet 3   Lactulose 20 GM/30ML SOLN Take 30 mLs (20 g total) by mouth daily as needed. Take 30 ml by mouth every 3 hours until you have bowel movement then daily as needed 450 mL 3   Lancets (ONETOUCH DELICA PLUS LANCET30G) MISC USE EVERY DAY  FOR GLUCOSE TESTING     levothyroxine (SYNTHROID) 200 MCG tablet TAKE 1 TABLET(200 MCG) BY MOUTH EVERY DAY BEFORE BREAKFAST 90 tablet 1   levothyroxine (SYNTHROID) 25 MCG tablet Take 1 tablet (25 mcg total) by mouth See admin instructions. Takes along with 200 mcg to total 225 mcg daily 90 tablet 0   loratadine (CLARITIN) 10 MG tablet TAKE 1 TABLET(10 MG) BY MOUTH DAILY AS NEEDED FOR ALLERGIES 30 tablet 2   magnesium oxide (MAG-OX) 400 (240 Mg) MG tablet TAKE 1 TABLET(400 MG) BY MOUTH TWICE DAILY 180 tablet 2   metFORMIN (GLUCOPHAGE) 500 MG tablet Take 1 tablet (500 mg total) by mouth daily with breakfast. 90 tablet 3   metroNIDAZOLE (FLAGYL) 500 MG tablet Take 4 tablets at once 4 tablet 0   naloxone (NARCAN) nasal spray 4 mg/0.1 mL Place 4 mg into the nose once as needed (accidental overdose).     nitroGLYCERIN (NITROSTAT) 0.4 MG SL tablet PLACE 1 TABLET UNDER THE TONGUE EVERY 5 MINUTES AS NEEDED FOR CHEST PAIN. CALL 911 AT THIRD DOSE IN 15 MINUTES 25 tablet 11   oxyCODONE-acetaminophen (PERCOCET) 10-325 MG tablet Take 1 tablet by mouth every 8 (eight) hours as needed for pain. 90 tablet 0   pantoprazole (PROTONIX) 40  MG tablet TAKE 1 TABLET(40 MG) BY MOUTH DAILY 30 tablet 3   potassium chloride SA (KLOR-CON M) 20 MEQ tablet Take 1 tablet (20 mEq total) by mouth daily. 30 tablet 3   rosuvastatin (CRESTOR) 40 MG tablet Take 40 mg by mouth every evening.     silodosin (RAPAFLO) 8 MG CAPS capsule TAKE 1 CAPSULE BY MOUTH TWICE DAILY 180 capsule 3   solifenacin (VESICARE) 10 MG tablet TAKE 1 TABLET(10 MG) BY MOUTH DAILY 30 tablet 11   sotalol (BETAPACE) 80 MG tablet Take 1 tablet (80 mg total) by mouth 2 (two) times daily. 180 tablet 3   Vibegron (GEMTESA) 75 MG TABS Take 1 tablet by mouth daily.     Vitamin D, Ergocalciferol, (DRISDOL) 1.25 MG (50000 UNIT) CAPS capsule TAKE 1 CAPSULE BY MOUTH EVERY 7 DAYS 12 capsule 0   No current facility-administered medications for this visit.     Past Medical  History:  Diagnosis Date   Arteriosclerotic cardiovascular disease (ASCVD) 2005   catheterization in 10/2010:50% mid LAD, diffuse distal disease, circumflex irregularities, large dominant RCA with a 50% ostial, 70% distal, 60% posterolateral and 70% PDA; normal EF   Arthritis    Benign prostatic hypertrophy    Bilateral carpal tunnel syndrome 07/03/2018   Cerebrovascular disease 2010   R. carotid endarterectomy; Duplex in 10/2010-widely patent ICAs, subtotal left vertebral-not thought to be contributing to symptoms   Cervical spine disease    CT in 2012-advanced degeneration and spondylosis with moderate spinal stenosis at C3-C6   CHF (congestive heart failure) (HCC)    Depression    Diabetes mellitus without complication (HCC)    Erectile dysfunction    Family history of breast cancer    Family history of cancer of mouth    Family history of CML (chronic monocytic leukemia)    Family history of lung cancer    Family history of ovarian cancer    Family history of stomach cancer    Gastroesophageal reflux disease    H/O hiatal hernia    H/O: substance abuse (HCC)    Cocaine, marijuana, alcohol.  Quit 2013.    Hyperlipidemia    Hypertension    Non-ST elevation myocardial infarction (NSTEMI), initial episode of care [facility]) 12/02/2013   DES LAD   Obesity    Prostate cancer (HCC)    Sleep apnea    CPAP   Tachy-brady syndrome (HCC)    a. 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   Past Surgical History:  Procedure Laterality Date   BRAIN SURGERY  2015   hematoma evacuation   BURR HOLE Right 04/13/2014   Procedure: Ezekiel Ina;  Surgeon: Temple Pacini, MD;  Location: [facility] NEURO ORS;  Service: Neurosurgery;  Laterality: Right;   CAROTID ENDARTERECTOMY Right Feb. 25, 2010    CEA   COLONOSCOPY WITH PROPOFOL N/A 06/24/2021    Procedure: COLONOSCOPY WITH PROPOFOL;  Surgeon: Jeani Hawking, MD;  Location: [facility] ENDOSCOPY;  Service: Endoscopy;  Laterality: N/A;   CORONARY ANGIOPLASTY WITH STENT PLACEMENT  12/03/2013   LAD 90%-->0% W/ Promus Premier DES 3.0 mm x 16 mm, CFX OK, RCA 40%, EF 70-75%   LEFT ATRIAL APPENDAGE OCCLUSION N/A 08/05/2015   Procedure: LEFT ATRIAL APPENDAGE OCCLUSION;  Surgeon: [name], MD;  Location: [facility] INVASIVE CV LAB;  Service: Cardiovascular;  Laterality: N/A;   LEFT HEART CATH AND CORONARY ANGIOGRAPHY N/A 09/13/2021   Procedure: LEFT HEART CATH AND CORONARY ANGIOGRAPHY;  Surgeon: Orbie Pyo, MD;  Location: [facility] INVASIVE CV LAB;  Service: Cardiovascular;  Laterality: N/A;   LEFT HEART CATHETERIZATION WITH CORONARY ANGIOGRAM Left 12/03/2013   Procedure: LEFT HEART CATHETERIZATION WITH CORONARY ANGIOGRAM;  Surgeon: Marykay Lex, MD;  Location: [facility] CATH LAB;  Service: Cardiovascular;  Laterality: Left;   LEFT HEART CATHETERIZATION WITH CORONARY ANGIOGRAM N/A 01/26/2014   Procedure: LEFT HEART CATHETERIZATION WITH CORONARY ANGIOGRAM;  Surgeon: Corky Crafts, MD;  Location: [facility] CATH LAB;  Service: Cardiovascular;  Laterality: N/A;   LEFT HEART CATHETERIZATION WITH CORONARY ANGIOGRAM N/A 08/03/2014   Procedure: LEFT HEART CATHETERIZATION WITH CORONARY ANGIOGRAM;  Surgeon: Kathleene Hazel, MD;  Location: [facility] CATH LAB;  Service: Cardiovascular;  Laterality: N/A;   PERCUTANEOUS CORONARY STENT INTERVENTION (PCI-S)  12/03/2013   Procedure: PERCUTANEOUS CORONARY STENT INTERVENTION (PCI-S);  Surgeon: Marykay Lex, MD;  Location: [facility] CATH LAB;  Service: Cardiovascular;;   PERMANENT PACEMAKER INSERTION N/A 09/18/2014   Procedure: PERMANENT PACEMAKER INSERTION;  Surgeon: Marinus Maw, MD;  Location: [facility] CATH LAB;  Service: Cardiovascular;  Laterality: N/A;   POLYPECTOMY  06/24/2021   Procedure: POLYPECTOMY;  Surgeon: Jeani Hawking, MD;  Location: [facility] ENDOSCOPY;  Service: Endoscopy;;   RADIOFREQUENCY  ABLATION  2005   for PSVT   TEE WITHOUT CARDIOVERSION N/A 07/27/2015   Procedure: TRANSESOPHAGEAL ECHOCARDIOGRAM (TEE);  Surgeon: Lewayne Bunting, MD;  Location: [facility] ENDOSCOPY;  Service: Cardiovascular;  Laterality: N/A;   TEE WITHOUT CARDIOVERSION N/A 09/15/2015   Procedure: TRANSESOPHAGEAL ECHOCARDIOGRAM (TEE);  Surgeon: Vesta Mixer, MD;  Location: [facility] ENDOSCOPY;  Service: Cardiovascular;  Laterality: N/A;     Family History  Problem Relation Age of Onset   Hypertension Mother        Cerebrovascular disease   Diabetes Mother    Coronary artery disease Father 3   Diabetes type II Father    Hypertension Father    Heart attack Father    Diabetes Brother    Hypertension Brother    Diabetes Sister    Hypertension Sister    Heart attack Sister 19   Leukemia Sister 1       CML   Breast cancer Maternal Grandmother        dx 41s   Lung cancer Maternal Uncle        dx >50, smoker   Cancer Cousin        mouth cancer, dx 22s, no smoking/chew tobacco hx (maternal 1st cousin)   Ovarian cancer Cousin        dx <50 (maternal 1st cousin)   Stomach cancer Cousin        dx 27s (maternal 1st cousin)   Cancer Cousin        type unknown to pt, dx >50 (paternal 1st cousin)     Social History   Socioeconomic History   Marital status: Married    Spouse name: Not on file   Number of children: 0   Years of education: Not on file   Highest education level: Not on file  Occupational History   Occupation: Retired  Tobacco Use   Smoking status: Former    Current packs/day: 0.00    Average packs/day: 1 pack/day for 40.0 years (40.0 ttl pk-yrs)    Types: Cigarettes    Start date: 10/20/1972  Quit date: 10/10/2012    Years since quitting: 10.2   Smokeless tobacco: Never   Tobacco comments:    Quit in May.   Vaping Use   Vaping status: Never Used  Substance and Sexual Activity   Alcohol use: Yes    Alcohol/week: 0.0 standard drinks of alcohol    Comment: former drinker-- sober  since 2013.    Drug use: No    Types: Cocaine    Comment: quit cocaine 10/2011   Sexual activity: Yes    Partners: Female  Other Topics Concern   Not on file  Social History Narrative   Lives in [place].   Social Determinants of Health   Financial Resource Strain: Low Risk  (04/19/2020)   Overall Financial Resource Strain (CARDIA)    Difficulty of Paying Living Expenses: Not hard at all  Food Insecurity: No Food Insecurity (04/19/2020)   Hunger Vital Sign    Worried About Running Out of Food in the Last Year: Never true    Ran Out of Food in the Last Year: Never true  Transportation Needs: No Transportation Needs (04/19/2020)   PRAPARE - Administrator, Civil Service (Medical): No    Lack of Transportation (Non-Medical): No  Physical Activity: Inactive (04/19/2020)   Exercise Vital Sign    Days of Exercise per Week: 0 days    Minutes of Exercise per Session: 0 min  Stress: No Stress Concern Present (04/19/2020)   [employer] of Occupational Health - Occupational Stress Questionnaire    Feeling of Stress : Not at all  Social Connections: Moderately Isolated (04/19/2020)   Social Connection and Isolation Panel [NHANES]    Frequency of Communication with Friends and Family: More than three times a week    Frequency of Social Gatherings with Friends and Family: Once a week    Attends Religious Services: Never    Database administrator or Organizations: No    Attends Banker Meetings: Never    Marital Status: Married  Catering manager Violence: Not At Risk (04/19/2020)   Humiliation, Afraid, Rape, and Kick questionnaire    Fear of Current or Ex-Partner: No    Emotionally Abused: No    Physically Abused: No    Sexually Abused: No     BP (!) 88/50   Pulse 62   Ht 6' (1.829 m)   Wt 289 lb (131.1 kg)   SpO2 97%   BMI 39.20 kg/m   Physical Exam:  Well appearing NAD HEENT: Unremarkable Neck:  No JVD, no thyromegally Lymphatics:  No  adenopathy Back:  No CVA tenderness Lungs:  Clear with no wheezes HEART:  Regular rate rhythm, no murmurs, no rubs, no clicks Abd:  soft, positive bowel sounds, no organomegally, no rebound, no guarding Ext:  2 plus pulses, no edema, no cyanosis, no clubbing Skin:  No rashes no nodules Neuro:  CN II through XII intact, motor grossly intact  EKG - NSR with atrial pacing  DEVICE  Normal device function.  See PaceArt for details.   Assess/Plan: PAF - he is maintaining NSR. He will continue his current meds. Stroke prevention -he is s/p Watchman and is doing well.  HTN - his bp is controlled.  Obesity - he continues to slowly lose weight and I continue to encouraged to lose more. Might eventually be a candidate for our GLP-1 drugs.  Loman Chroman Carola Viramontes,MD

## 2023-01-23 NOTE — Patient Instructions (Signed)

## 2023-02-01 ENCOUNTER — Other Ambulatory Visit: Payer: Self-pay | Admitting: Nurse Practitioner

## 2023-02-01 DIAGNOSIS — E1169 Type 2 diabetes mellitus with other specified complication: Secondary | ICD-10-CM

## 2023-02-07 ENCOUNTER — Ambulatory Visit (INDEPENDENT_AMBULATORY_CARE_PROVIDER_SITE_OTHER): Payer: 59

## 2023-02-07 DIAGNOSIS — I495 Sick sinus syndrome: Secondary | ICD-10-CM

## 2023-02-08 LAB — CUP PACEART REMOTE DEVICE CHECK
Battery Remaining Longevity: 35 mo
Battery Remaining Percentage: 28 %
Battery Voltage: 2.93 V
Brady Statistic AP VP Percent: 1.1 %
Brady Statistic AP VS Percent: 30 %
Brady Statistic AS VP Percent: 1 %
Brady Statistic AS VS Percent: 69 %
Brady Statistic RA Percent Paced: 30 %
Brady Statistic RV Percent Paced: 1.3 %
Date Time Interrogation Session: 20240904020014
Implantable Lead Connection Status: 753985
Implantable Lead Connection Status: 753985
Implantable Lead Implant Date: 20160415
Implantable Lead Implant Date: 20160415
Implantable Lead Location: 753859
Implantable Lead Location: 753860
Implantable Pulse Generator Implant Date: 20160415
Lead Channel Impedance Value: 410 Ohm
Lead Channel Impedance Value: 580 Ohm
Lead Channel Pacing Threshold Amplitude: 0.75 V
Lead Channel Pacing Threshold Amplitude: 0.75 V
Lead Channel Pacing Threshold Pulse Width: 0.5 ms
Lead Channel Pacing Threshold Pulse Width: 0.5 ms
Lead Channel Sensing Intrinsic Amplitude: 12 mV
Lead Channel Sensing Intrinsic Amplitude: 3.2 mV
Lead Channel Setting Pacing Amplitude: 2 V
Lead Channel Setting Pacing Amplitude: 2.5 V
Lead Channel Setting Pacing Pulse Width: 0.5 ms
Lead Channel Setting Sensing Sensitivity: 2 mV
Pulse Gen Model: 2240
Pulse Gen Serial Number: 7756161

## 2023-02-12 ENCOUNTER — Inpatient Hospital Stay: Payer: 59 | Attending: Hematology

## 2023-02-12 DIAGNOSIS — Z833 Family history of diabetes mellitus: Secondary | ICD-10-CM | POA: Diagnosis not present

## 2023-02-12 DIAGNOSIS — I2510 Atherosclerotic heart disease of native coronary artery without angina pectoris: Secondary | ICD-10-CM | POA: Insufficient documentation

## 2023-02-12 DIAGNOSIS — Z800 Family history of malignant neoplasm of digestive organs: Secondary | ICD-10-CM | POA: Diagnosis not present

## 2023-02-12 DIAGNOSIS — Z801 Family history of malignant neoplasm of trachea, bronchus and lung: Secondary | ICD-10-CM | POA: Diagnosis not present

## 2023-02-12 DIAGNOSIS — Z87891 Personal history of nicotine dependence: Secondary | ICD-10-CM | POA: Diagnosis not present

## 2023-02-12 DIAGNOSIS — E079 Disorder of thyroid, unspecified: Secondary | ICD-10-CM | POA: Diagnosis not present

## 2023-02-12 DIAGNOSIS — E785 Hyperlipidemia, unspecified: Secondary | ICD-10-CM | POA: Insufficient documentation

## 2023-02-12 DIAGNOSIS — Z8041 Family history of malignant neoplasm of ovary: Secondary | ICD-10-CM | POA: Insufficient documentation

## 2023-02-12 DIAGNOSIS — Z803 Family history of malignant neoplasm of breast: Secondary | ICD-10-CM | POA: Insufficient documentation

## 2023-02-12 DIAGNOSIS — N400 Enlarged prostate without lower urinary tract symptoms: Secondary | ICD-10-CM | POA: Insufficient documentation

## 2023-02-12 DIAGNOSIS — M8580 Other specified disorders of bone density and structure, unspecified site: Secondary | ICD-10-CM | POA: Diagnosis not present

## 2023-02-12 DIAGNOSIS — Z79899 Other long term (current) drug therapy: Secondary | ICD-10-CM | POA: Diagnosis not present

## 2023-02-12 DIAGNOSIS — D649 Anemia, unspecified: Secondary | ICD-10-CM | POA: Insufficient documentation

## 2023-02-12 DIAGNOSIS — Z8249 Family history of ischemic heart disease and other diseases of the circulatory system: Secondary | ICD-10-CM | POA: Diagnosis not present

## 2023-02-12 DIAGNOSIS — E119 Type 2 diabetes mellitus without complications: Secondary | ICD-10-CM | POA: Diagnosis not present

## 2023-02-12 DIAGNOSIS — K7460 Unspecified cirrhosis of liver: Secondary | ICD-10-CM | POA: Insufficient documentation

## 2023-02-12 DIAGNOSIS — E559 Vitamin D deficiency, unspecified: Secondary | ICD-10-CM | POA: Insufficient documentation

## 2023-02-12 DIAGNOSIS — I252 Old myocardial infarction: Secondary | ICD-10-CM | POA: Diagnosis not present

## 2023-02-12 DIAGNOSIS — Z191 Hormone sensitive malignancy status: Secondary | ICD-10-CM

## 2023-02-12 DIAGNOSIS — I110 Hypertensive heart disease with heart failure: Secondary | ICD-10-CM | POA: Diagnosis not present

## 2023-02-12 DIAGNOSIS — Z807 Family history of other malignant neoplasms of lymphoid, hematopoietic and related tissues: Secondary | ICD-10-CM | POA: Diagnosis not present

## 2023-02-12 DIAGNOSIS — R232 Flushing: Secondary | ICD-10-CM | POA: Insufficient documentation

## 2023-02-12 DIAGNOSIS — C61 Malignant neoplasm of prostate: Secondary | ICD-10-CM | POA: Diagnosis present

## 2023-02-12 DIAGNOSIS — I509 Heart failure, unspecified: Secondary | ICD-10-CM | POA: Insufficient documentation

## 2023-02-12 DIAGNOSIS — Z806 Family history of leukemia: Secondary | ICD-10-CM | POA: Insufficient documentation

## 2023-02-12 DIAGNOSIS — D696 Thrombocytopenia, unspecified: Secondary | ICD-10-CM | POA: Diagnosis not present

## 2023-02-12 DIAGNOSIS — Z808 Family history of malignant neoplasm of other organs or systems: Secondary | ICD-10-CM | POA: Insufficient documentation

## 2023-02-12 LAB — COMPREHENSIVE METABOLIC PANEL
ALT: 10 U/L (ref 0–44)
AST: 20 U/L (ref 15–41)
Albumin: 3.9 g/dL (ref 3.5–5.0)
Alkaline Phosphatase: 75 U/L (ref 38–126)
Anion gap: 9 (ref 5–15)
BUN: 17 mg/dL (ref 8–23)
CO2: 21 mmol/L — ABNORMAL LOW (ref 22–32)
Calcium: 9.2 mg/dL (ref 8.9–10.3)
Chloride: 109 mmol/L (ref 98–111)
Creatinine, Ser: 1.2 mg/dL (ref 0.61–1.24)
GFR, Estimated: >60 mL/min (ref >60)
Glucose, Bld: 98 mg/dL (ref 70–99)
Potassium: 3.6 mmol/L (ref 3.5–5.1)
Sodium: 139 mmol/L (ref 135–145)
Total Bilirubin: 0.9 mg/dL (ref 0.3–1.2)
Total Protein: 8 g/dL (ref 6.5–8.1)

## 2023-02-12 LAB — CBC
HCT: 40 % (ref 39.0–52.0)
Hemoglobin: 13.1 g/dL (ref 13.0–17.0)
MCH: 33 pg (ref 26.0–34.0)
MCHC: 32.8 g/dL (ref 30.0–36.0)
MCV: 100.8 fL — ABNORMAL HIGH (ref 80.0–100.0)
Platelets: 119 10*3/uL — ABNORMAL LOW (ref 150–400)
RBC: 3.97 MIL/uL — ABNORMAL LOW (ref 4.22–5.81)
RDW: 14.8 % (ref 11.5–15.5)
WBC: 6.9 10*3/uL (ref 4.0–10.5)
nRBC: 0 % (ref 0.0–0.2)

## 2023-02-12 LAB — IRON AND TIBC
Iron: 88 ug/dL (ref 45–182)
Saturation Ratios: 25 % (ref 17.9–39.5)
TIBC: 348 ug/dL (ref 250–450)
UIBC: 260 ug/dL

## 2023-02-12 LAB — PSA: Prostatic Specific Antigen: <0.01 ng/mL (ref 0.00–4.00)

## 2023-02-12 LAB — FERRITIN: Ferritin: 153 ng/mL (ref 24–336)

## 2023-02-12 LAB — VITAMIN D 25 HYDROXY (VIT D DEFICIENCY, FRACTURES): Vit D, 25-Hydroxy: 122.46 ng/mL — ABNORMAL HIGH (ref 30–100)

## 2023-02-15 ENCOUNTER — Other Ambulatory Visit: Payer: Self-pay | Admitting: *Deleted

## 2023-02-15 ENCOUNTER — Inpatient Hospital Stay (HOSPITAL_BASED_OUTPATIENT_CLINIC_OR_DEPARTMENT_OTHER): Payer: 59 | Admitting: Hematology

## 2023-02-15 ENCOUNTER — Encounter (HOSPITAL_COMMUNITY): Payer: Self-pay | Admitting: Hematology

## 2023-02-15 ENCOUNTER — Other Ambulatory Visit (HOSPITAL_COMMUNITY): Payer: Self-pay

## 2023-02-15 ENCOUNTER — Other Ambulatory Visit: Payer: Self-pay | Admitting: Hematology

## 2023-02-15 VITALS — BP 104/59 | HR 69 | Temp 98.7°F | Resp 16 | Wt 284.0 lb

## 2023-02-15 DIAGNOSIS — C61 Malignant neoplasm of prostate: Secondary | ICD-10-CM

## 2023-02-15 DIAGNOSIS — Z191 Hormone sensitive malignancy status: Secondary | ICD-10-CM

## 2023-02-15 DIAGNOSIS — E559 Vitamin D deficiency, unspecified: Secondary | ICD-10-CM

## 2023-02-15 MED ORDER — APALUTAMIDE 60 MG PO TABS
ORAL_TABLET | ORAL | 3 refills | Status: DC
  Filled 2023-02-15: qty 120, 30d supply, fill #0
  Filled 2023-03-19: qty 120, 30d supply, fill #1
  Filled 2023-04-17: qty 120, 30d supply, fill #2
  Filled 2023-05-17: qty 120, 30d supply, fill #3

## 2023-02-15 NOTE — Progress Notes (Signed)
[facility] [address] [place] [address]    Clinic Day:  02/15/2023  Referring physician: Fleet Contras, MD  Patient Care Team: Fleet Contras, MD as PCP - General (Internal Medicine) Marinus Maw, MD as PCP - Cardiology (Cardiology) Marinus Maw, MD (Cardiology) Early, [name], MD (Inactive) as Attending Physician (Vascular Surgery) [name], MD as Consulting Physician (Endocrinology) [name], MD (Inactive) as Consulting Physician (Cardiology) [name], [facility] (Inactive) as Pharmacist (Pharmacist) [name], RN as Oncology Nurse Navigator (Oncology) [name], MD as Medical Oncologist (Medical Oncology)   ASSESSMENT & PLAN:   Assessment: 1.  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 -Status post IMRT, 40 sessions completed on 06/14/2016 by Dr. Kathrynn Running. -PSA 0.7 (10/15/2018), 0.8 (02/18/2019), 1.4 (December 2020), 1.4 (June 2021) -Prostate biopsy on 02/10/2020-1 out of 12 cores from left mid lateral region positive for adenocarcinoma. -CTAP on 03/09/2020 with newly enlarged left iliac lymph nodes measuring 1.1 x 0.9 cm.  Hepatic steatosis with early signs of cirrhosis. -F-18 PSMA PET scan on 04/12/2020 showed radiotracer activity associated with enlarged left external iliac lymph node 9 mm with SUV 7.6.  Small left common iliac lymph node 6 mm, SUV 8.6.  Left periaortic lymph node 5 mm, SUV 6.3.  Lymph node between IVC and aorta at the level of right renal hilum, SUV 8.6.  Lymph node deep to the IVC measuring 6 mm, SUV 5.4.  No skeletal metastasis. 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 - Firmagon started on 04/21/2020 - Apalutamide 240 milligrams daily started around 04/22/2020.   2.  Social/family history: -He is a retired Product manager.  He lives at home with his wife.  He plays golf.  He quit smoking in 2015, smoked 1 pack/day for more than 20 years. -Sister has CML.  Maternal grandmother had breast cancer.  Maternal uncle had lung cancer.  2 maternal cousins had tongue cancer and uterine cancer.   3.  Cirrhosis: -Prior imaging showed cirrhosis of the liver with normal spleen.    Plan: 1.  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 - Received Eligard 45 mg on 12/21/2022. - Labs from 02/12/2023: Normal LFTs.  PSA is less than 0.01. - He will continue apalutamide to 40 mg daily. - Recommend follow-up in 3 months with repeat PSA and labs.   2.  Normocytic anemia: - He received last Feraheme on 12/15/2022. - Ferritin improved to 153 and hemoglobin improved to 13.1.  No indication for parenteral iron therapy at this time.   3.  Mild thrombocytopenia: - Mild thrombocytopenia on and off since December 2020 has been stable.  Current platelet count is 119.   4.  Osteopenia (DEXA 11/15/2021 T score -1.6): - He is taking vitamin D 50,000 units weekly. - Vitamin D level is high at 122. - Recommend changing vitamin D to 50,000 units every other week. - Because of his osteopenia and antiandrogen therapy, I have recommended bisphosphonate in the form of Prolia every 6 months to decrease bone degradation.  We discussed side effects in detail.  He is having dental extractions done on 02/19/2023. - Would likely start him on Prolia at next visit in 3 months.    Orders Placed This Encounter  Procedures   Comprehensive metabolic panel  Standing Status:   Future    Standing Expiration Date:   02/15/2024    Order Specific Question:   Release to patient    Answer:   Immediate   CBC with Differential/Platelet    Standing Status:   Future    Standing Expiration Date:   02/15/2024    Order Specific Question:   Release to patient    Answer:    Immediate   VITAMIN D 25 Hydroxy (Vit-D Deficiency, Fractures)    Standing Status:   Future    Standing Expiration Date:   02/15/2024    Order Specific Question:   Release to patient    Answer:   Immediate   PSA    Standing Status:   Future    Standing Expiration Date:   02/15/2024      Alben Deeds Teague,acting as a scribe for [name], MD.,have documented all relevant documentation on the behalf of [name], MD,as directed by  [name], MD while in the presence of [name], MD.  I, [name] MD, have reviewed the above documentation for accuracy and completeness, and I agree with the above.    [name], MD   9/12/20244:09 PM  CHIEF COMPLAINT:   Diagnosis: metastatic castration sensitive prostate cancer    Cancer Staging  No matching staging information was found for the patient.    Prior Therapy: IMRT x 40 sessions completed on 06/14/2016   Current Therapy:  Firmagon every month; Erleada 240 mg daily    HISTORY OF PRESENT ILLNESS:   Oncology History  Prostate cancer (HCC)  03/10/2016 Initial Diagnosis   Prostate cancer (HCC)   05/23/2020 Genetic Testing   Negative genetic testing:  No pathogenic variants detected on the [facility] Common Hereditary Cancers Panel + Prostate Cancer HRR Panel. A variant of uncertain significance (VUS) was detected in the NF1 gene called c.1178A>G. The report date is 05/23/2020.  The Common Hereditary Cancers Panel offered by [facility] includes sequencing and/or deletion duplication testing of the following 47 genes: APC, ATM, AXIN2, BARD1, BMPR1A, BRCA1, BRCA2, BRIP1, CDH1, CDK4, CDKN2A (p14ARF), CDKN2A (p16INK4a), CHEK2, CTNNA1, DICER1, EPCAM (Deletion/duplication testing only), GREM1 (promoter region deletion/duplication testing only), KIT, MEN1, MLH1, MSH2, MSH3, MSH6, MUTYH, NBN, NF1, NTHL1, PALB2, PDGFRA, PMS2, POLD1, POLE, PTEN, RAD50, RAD51C, RAD51D, SDHB, SDHC, SDHD, SMAD4,  SMARCA4. STK11, TP53, TSC1, TSC2, and VHL.  The following genes were evaluated for sequence changes only: SDHA and HOXB13 c.251G>A variant only. The Prostate Cancer HRR Panel offered by [facility] includes sequencing and/or deletion duplication analysis of the following 10 genes: ATM, BARD1, BRCA1, BRCA2, BRIP1, CHEK2, FANCL, PALB2, RAD51C, RAD51D.      INTERVAL HISTORY:   Imre is a 67 y.o. male presenting to clinic today for follow up of metastatic castration sensitive prostate cancer. He was last seen by me on 11/27/22.  Today, he states that he is doing well overall. His appetite level is at 100%. His energy level is at 80%.  He denies any side effects from Eligard treatments, including hot flashes. He is getting all of his teeth pulled on 02/19/23. He is taking Vitamin D 1x weekly. He reports IV Iron from 12/15/22 improved energy levels. He denies sever fatigue, aches, and pains.  PAST MEDICAL HISTORY:   Past Medical History: Past Medical History:  Diagnosis Date   Arteriosclerotic cardiovascular disease (ASCVD) 2005   catheterization in 10/2010:50% mid LAD, diffuse distal disease, circumflex irregularities, large dominant RCA with a 50% ostial, 70% distal, 60% posterolateral and 70%  PDA; normal EF   Arthritis    Benign prostatic hypertrophy    Bilateral carpal tunnel syndrome 07/03/2018   Cerebrovascular disease 2010   R. carotid endarterectomy; Duplex in 10/2010-widely patent ICAs, subtotal left vertebral-not thought to be contributing to symptoms   Cervical spine disease    CT in 2012-advanced degeneration and spondylosis with moderate spinal stenosis at C3-C6   CHF (congestive heart failure) (HCC)    Depression    Diabetes mellitus without complication (HCC)    Erectile dysfunction    Family history of breast cancer    Family history of cancer of mouth    Family history of CML (chronic monocytic leukemia)    Family history of lung cancer    Family history of ovarian cancer     Family history of stomach cancer    Gastroesophageal reflux disease    H/O hiatal hernia    H/O: substance abuse (HCC)    Cocaine, marijuana, alcohol.  Quit 2013.    Hyperlipidemia    Hypertension    Non-ST elevation myocardial infarction (NSTEMI), initial episode of care (HCC) 12/02/2013   DES LAD   Obesity    Prostate cancer (HCC)    Sleep apnea    CPAP   Tachy-brady syndrome (HCC)    a. 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 25, 2010    CEA   COLONOSCOPY WITH PROPOFOL N/A 06/24/2021   Procedure: COLONOSCOPY WITH PROPOFOL;  Surgeon: Jeani Hawking, MD;  Location: [facility] ENDOSCOPY;  Service: Endoscopy;  Laterality: N/A;   CORONARY ANGIOPLASTY WITH STENT PLACEMENT  12/03/2013   LAD 90%-->0% W/ Promus Premier DES 3.0 mm x 16 mm, CFX OK, RCA 40%, EF 70-75%   LEFT ATRIAL APPENDAGE OCCLUSION N/A 08/05/2015   Procedure: LEFT ATRIAL APPENDAGE OCCLUSION;  Surgeon: [name], MD;  Location: [facility] INVASIVE CV LAB;  Service: Cardiovascular;  Laterality: N/A;   LEFT HEART CATH AND CORONARY ANGIOGRAPHY N/A 09/13/2021   Procedure: LEFT HEART CATH AND CORONARY ANGIOGRAPHY;  Surgeon: Orbie Pyo, MD;  Location: [facility] INVASIVE CV LAB;  Service: Cardiovascular;  Laterality: N/A;   LEFT HEART CATHETERIZATION WITH CORONARY ANGIOGRAM Left 12/03/2013   Procedure: LEFT HEART CATHETERIZATION WITH CORONARY ANGIOGRAM;  Surgeon: Marykay Lex, MD;  Location: [facility] CATH LAB;  Service: Cardiovascular;  Laterality: Left;   LEFT HEART CATHETERIZATION WITH CORONARY ANGIOGRAM N/A 01/26/2014   Procedure: LEFT HEART CATHETERIZATION WITH  CORONARY ANGIOGRAM;  Surgeon: Corky Crafts, MD;  Location: [facility] CATH LAB;  Service: Cardiovascular;  Laterality: N/A;   LEFT HEART CATHETERIZATION WITH CORONARY ANGIOGRAM N/A 08/03/2014   Procedure: LEFT HEART CATHETERIZATION WITH CORONARY ANGIOGRAM;  Surgeon: Kathleene Hazel, MD;  Location[facility] CATH LAB;  Service: Cardiovascular;  Laterality: N/A;   PERCUTANEOUS CORONARY STENT INTERVENTION (PCI-S)  12/03/2013   Procedure: PERCUTANEOUS CORONARY STENT INTERVENTION (PCI-S);  Surgeon: Marykay Lex, MD;  Location: [facility] CATH LAB;  Service: Cardiovascular;;   PERMANENT PACEMAKER INSERTION N/A 09/18/2014   Procedure: PERMANENT PACEMAKER INSERTION;  Surgeon: Marinus Maw, MD;  Location: [facility]  CATH LAB;  Service: Cardiovascular;  Laterality: N/A;   POLYPECTOMY  06/24/2021   Procedure: POLYPECTOMY;  Surgeon: Jeani Hawking, MD;  Location: [facility] ENDOSCOPY;  Service: Endoscopy;;   RADIOFREQUENCY ABLATION  2005   for PSVT   TEE WITHOUT CARDIOVERSION N/A 07/27/2015   Procedure: TRANSESOPHAGEAL ECHOCARDIOGRAM (TEE);  Surgeon: Lewayne Bunting, MD;  Location: [facility] ENDOSCOPY;  Service: Cardiovascular;  Laterality: N/A;   TEE WITHOUT CARDIOVERSION N/A 09/15/2015   Procedure: TRANSESOPHAGEAL ECHOCARDIOGRAM (TEE);  Surgeon: Vesta Mixer, MD;  Location: [facility] ENDOSCOPY;  Service: Cardiovascular;  Laterality: N/A;    Social History: Social History   Socioeconomic History   Marital status: Married    Spouse name: Not on file   Number of children: 0   Years of education: Not on file   Highest education level: Not on file  Occupational History   Occupation: Retired  Tobacco Use   Smoking status: Former    Current packs/day: 0.00    Average packs/day: 1 pack/day for 40.0 years (40.0 ttl pk-yrs)    Types: Cigarettes    Start date: 10/20/1972    Quit date: 10/10/2012    Years since quitting: 10.3   Smokeless tobacco: Never   Tobacco comments:    Quit in May.   Vaping Use   Vaping status: Never Used   Substance and Sexual Activity   Alcohol use: Yes    Alcohol/week: 0.0 standard drinks of alcohol    Comment: former drinker-- sober since 2013.    Drug use: No    Types: Cocaine    Comment: quit cocaine 10/2011   Sexual activity: Yes    Partners: Female  Other Topics Concern   Not on file  Social History Narrative   Lives in [place].   Social Determinants of Health   Financial Resource Strain: Low Risk  (04/19/2020)   Overall Financial Resource Strain (CARDIA)    Difficulty of Paying Living Expenses: Not hard at all  Food Insecurity: No Food Insecurity (04/19/2020)   Hunger Vital Sign    Worried About Running Out of Food in the Last Year: Never true    Ran Out of Food in the Last Year: Never true  Transportation Needs: No Transportation Needs (04/19/2020)   PRAPARE - Administrator, Civil Service (Medical): No    Lack of Transportation (Non-Medical): No  Physical Activity: Inactive (04/19/2020)   Exercise Vital Sign    Days of Exercise per Week: 0 days    Minutes of Exercise per Session: 0 min  Stress: No Stress Concern Present (04/19/2020)   [employer] of Occupational Health - Occupational Stress Questionnaire    Feeling of Stress : Not at all  Social Connections: Moderately Isolated (04/19/2020)   Social Connection and Isolation Panel [NHANES]    Frequency of Communication with Friends and Family: More than three times a week    Frequency of Social Gatherings with Friends and Family: Once a week    Attends Religious Services: Never    Database administrator or Organizations: No    Attends Banker Meetings: Never    Marital Status: Married  Catering manager Violence: Not At Risk (04/19/2020)   Humiliation, Afraid, Rape, and Kick questionnaire    Fear of Current or Ex-Partner: No    Emotionally Abused: No    Physically Abused: No    Sexually Abused: No    Family History: Family History  Problem Relation Age of Onset    Hypertension Mother  Cerebrovascular disease   Diabetes Mother    Coronary artery disease Father 49   Diabetes type II Father    Hypertension Father    Heart attack Father    Diabetes Brother    Hypertension Brother    Diabetes Sister    Hypertension Sister    Heart attack Sister 27   Leukemia Sister 15       CML   Breast cancer Maternal Grandmother        dx 89s   Lung cancer Maternal Uncle        dx >50, smoker   Cancer Cousin        mouth cancer, dx 52s, no smoking/chew tobacco hx (maternal 1st cousin)   Ovarian cancer Cousin        dx <50 (maternal 1st cousin)   Stomach cancer Cousin        dx 8s (maternal 1st cousin)   Cancer Cousin        type unknown to pt, dx >50 (paternal 1st cousin)    Current Medications:  Current Outpatient Medications:    allopurinol (ZYLOPRIM) 100 MG tablet, TAKE 1 TABLET(100 MG) BY MOUTH DAILY, Disp: 90 tablet, Rfl: 0   apalutamide (ERLEADA) 60 MG tablet, TAKE 4 TABLETS (240 MG TOTAL) BY MOUTH DAILY. MAY BE TAKEN WITH OR WITHOUT FOOD. SWALLOW TABLETS WHOLE., Disp: 120 tablet, Rfl: 3   aspirin EC 325 MG tablet, Take 1 tablet (325 mg total) daily by mouth., Disp: 30 tablet, Rfl: 0   Blood Glucose Monitoring Suppl (BLOOD GLUCOSE SYSTEM PAK) KIT, Use as directed to monitor FSBS 1x daily. Dx: E11.9., Disp: 1 kit, Rfl: 1   Cyanocobalamin (VITAMIN B 12) 500 MCG TABS, Take 500 mcg by mouth in the morning., Disp: , Rfl:    diclofenac Sodium (VOLTAREN) 1 % GEL, 2 g 2 (two) times daily., Disp: , Rfl:    FARXIGA 5 MG TABS tablet, Take 5 mg by mouth daily., Disp: , Rfl:    fluticasone (FLONASE) 50 MCG/ACT nasal spray, Place 2 sprays into both nostrils daily., Disp: 16 g, Rfl: 6   furosemide (LASIX) 20 MG tablet, TAKE 3 TABLETS BY MOUTH DAILY, Disp: 270 tablet, Rfl: 3   glipiZIDE (GLUCOTROL XL) 5 MG 24 hr tablet, TAKE 1 TABLET(5 MG) BY MOUTH DAILY WITH BREAKFAST, Disp: 90 tablet, Rfl: 0   glucose blood (ONETOUCH ULTRA) test strip, Use as instructed to  monitor glucose twice daily., Disp: 100 strip, Rfl: 3   isosorbide mononitrate (IMDUR) 30 MG 24 hr tablet, Take 1 tablet (30 mg total) by mouth daily., Disp: 90 tablet, Rfl: 3   Lactulose 20 GM/30ML SOLN, Take 30 mLs (20 g total) by mouth daily as needed. Take 30 ml by mouth every 3 hours until you have bowel movement then daily as needed, Disp: 450 mL, Rfl: 3   Lancets (ONETOUCH DELICA PLUS LANCET30G) MISC, USE EVERY DAY FOR GLUCOSE TESTING, Disp: , Rfl:    levothyroxine (SYNTHROID) 200 MCG tablet, TAKE 1 TABLET(200 MCG) BY MOUTH EVERY DAY BEFORE BREAKFAST, Disp: 90 tablet, Rfl: 1   levothyroxine (SYNTHROID) 25 MCG tablet, Take 1 tablet (25 mcg total) by mouth See admin instructions. Takes along with 200 mcg to total 225 mcg daily, Disp: 90 tablet, Rfl: 0   loratadine (CLARITIN) 10 MG tablet, TAKE 1 TABLET(10 MG) BY MOUTH DAILY AS NEEDED FOR ALLERGIES, Disp: 30 tablet, Rfl: 2   magnesium oxide (MAG-OX) 400 (240 Mg) MG tablet, TAKE 1 TABLET(400 MG) BY MOUTH TWICE  DAILY, Disp: 180 tablet, Rfl: 2   metFORMIN (GLUCOPHAGE) 500 MG tablet, Take 1 tablet (500 mg total) by mouth daily with breakfast., Disp: 90 tablet, Rfl: 3   metroNIDAZOLE (FLAGYL) 500 MG tablet, Take 4 tablets at once, Disp: 4 tablet, Rfl: 0   naloxone (NARCAN) nasal spray 4 mg/0.1 mL, Place 4 mg into the nose once as needed (accidental overdose)., Disp: , Rfl:    nitroGLYCERIN (NITROSTAT) 0.4 MG SL tablet, PLACE 1 TABLET UNDER THE TONGUE EVERY 5 MINUTES AS NEEDED FOR CHEST PAIN. CALL 911 AT THIRD DOSE IN 15 MINUTES, Disp: 25 tablet, Rfl: 11   oxyCODONE-acetaminophen (PERCOCET) 10-325 MG tablet, Take 1 tablet by mouth every 8 (eight) hours as needed for pain., Disp: 90 tablet, Rfl: 0   pantoprazole (PROTONIX) 40 MG tablet, TAKE 1 TABLET(40 MG) BY MOUTH DAILY, Disp: 30 tablet, Rfl: 3   potassium chloride SA (KLOR-CON M) 20 MEQ tablet, Take 1 tablet (20 mEq total) by mouth daily., Disp: 30 tablet, Rfl: 3   rosuvastatin (CRESTOR) 40 MG tablet,  Take 40 mg by mouth every evening., Disp: , Rfl:    silodosin (RAPAFLO) 8 MG CAPS capsule, TAKE 1 CAPSULE BY MOUTH TWICE DAILY, Disp: 180 capsule, Rfl: 3   solifenacin (VESICARE) 10 MG tablet, TAKE 1 TABLET(10 MG) BY MOUTH DAILY, Disp: 30 tablet, Rfl: 11   sotalol (BETAPACE) 80 MG tablet, Take 1 tablet (80 mg total) by mouth 2 (two) times daily., Disp: 180 tablet, Rfl: 3   Vibegron (GEMTESA) 75 MG TABS, Take 1 tablet by mouth daily., Disp: , Rfl:    Vitamin D, Ergocalciferol, (DRISDOL) 1.25 MG (50000 UNIT) CAPS capsule, TAKE 1 CAPSULE BY MOUTH EVERY 7 DAYS, Disp: 12 capsule, Rfl: 0   Allergies: Allergies  Allergen Reactions   Trazodone And Nefazodone     Nightmares   Lactose Intolerance (Gi) Other (See Comments)    UPSET STOMACH     REVIEW OF SYSTEMS:   Review of Systems  Constitutional:  Negative for chills, fatigue and fever.  HENT:   Negative for lump/mass, mouth sores, nosebleeds, sore throat and trouble swallowing.   Eyes:  Negative for eye problems.  Respiratory:  Negative for cough and shortness of breath.   Cardiovascular:  Negative for chest pain, leg swelling and palpitations.  Gastrointestinal:  Positive for constipation. Negative for abdominal pain, diarrhea, nausea and vomiting.  Genitourinary:  Negative for bladder incontinence, difficulty urinating, dysuria, frequency, hematuria and nocturia.   Musculoskeletal:  Positive for back pain (9/10 severity). Negative for arthralgias, flank pain, myalgias and neck pain.  Skin:  Negative for itching and rash.  Neurological:  Positive for headaches. Negative for dizziness and numbness.  Hematological:  Does not bruise/bleed easily.  Psychiatric/Behavioral:  Negative for depression, sleep disturbance and suicidal ideas. The patient is not nervous/anxious.   All other systems reviewed and are negative.    VITALS:   Blood pressure (!) 104/59, pulse 69, temperature 98.7 F (37.1 C), temperature source Oral, resp. rate 16,  weight 284 lb (128.8 kg), SpO2 100%.  Wt Readings from Last 3 Encounters:  02/15/23 284 lb (128.8 kg)  01/23/23 289 lb (131.1 kg)  12/15/22 285 lb (129.3 kg)    Body mass index is 38.52 kg/m.  Performance status (ECOG): 1 - Symptomatic but completely ambulatory  PHYSICAL EXAM:   Physical Exam Vitals and nursing note reviewed. Exam conducted with a chaperone present.  Constitutional:      Appearance: Normal appearance.  Cardiovascular:  Rate and Rhythm: Normal rate and regular rhythm.     Pulses: Normal pulses.     Heart sounds: Normal heart sounds.  Pulmonary:     Effort: Pulmonary effort is normal.     Breath sounds: Normal breath sounds.  Abdominal:     Palpations: Abdomen is soft. There is no hepatomegaly, splenomegaly or mass.     Tenderness: There is no abdominal tenderness.  Musculoskeletal:     Right lower leg: No edema.     Left lower leg: No edema.  Lymphadenopathy:     Cervical: No cervical adenopathy.     Right cervical: No superficial, deep or posterior cervical adenopathy.    Left cervical: No superficial, deep or posterior cervical adenopathy.     Upper Body:     Right upper body: No supraclavicular or axillary adenopathy.     Left upper body: No supraclavicular or axillary adenopathy.  Neurological:     General: No focal deficit present.     Mental Status: He is alert and oriented to person, place, and time.  Psychiatric:        Mood and Affect: Mood normal.        Behavior: Behavior normal.     LABS:      Latest Ref Rng & Units 02/12/2023    2:57 PM 11/20/2022    2:05 PM 10/29/2022    9:59 AM  CBC  WBC 4.0 - 10.5 K/uL 6.9  5.7  4.8   Hemoglobin 13.0 - 17.0 g/dL 40.9  81.1  91.4   Hematocrit 39.0 - 52.0 % 40.0  38.1  33.0   Platelets 150 - 400 K/uL 119  110  102       Latest Ref Rng & Units 02/12/2023    2:57 PM 12/18/2022    9:38 AM 11/20/2022    2:05 PM  CMP  Glucose 70 - 99 mg/dL 98  782  956   BUN 8 - 23 mg/dL 17  14  15    Creatinine  0.61 - 1.24 mg/dL 2.13  0.86  5.78   Sodium 135 - 145 mmol/L 139  142  139   Potassium 3.5 - 5.1 mmol/L 3.6  3.2  3.7   Chloride 98 - 111 mmol/L 109  108  109   CO2 22 - 32 mmol/L 21  22  22    Calcium 8.9 - 10.3 mg/dL 9.2  9.4  9.1   Total Protein 6.5 - 8.1 g/dL 8.0   7.4   Total Bilirubin 0.3 - 1.2 mg/dL 0.9   0.8   Alkaline Phos 38 - 126 U/L 75   92   AST 15 - 41 U/L 20   24   ALT 0 - 44 U/L 10   12      No results found for: "CEA1", "CEA" / No results found for: "CEA1", "CEA" No results found for: "PSA1" No results found for: "ION629" No results found for: "CAN125"  No results found for: "TOTALPROTELP", "ALBUMINELP", "A1GS", "A2GS", "BETS", "BETA2SER", "GAMS", "MSPIKE", "SPEI" Lab Results  Component Value Date   TIBC 348 02/12/2023   TIBC 407 11/20/2022   TIBC 451 (H) 08/21/2022   FERRITIN 153 02/12/2023   FERRITIN 44 11/20/2022   FERRITIN 25 08/21/2022   IRONPCTSAT 25 02/12/2023   IRONPCTSAT 15 (L) 11/20/2022   IRONPCTSAT 14 (L) 08/21/2022   No results found for: "LDH"   STUDIES:   CUP PACEART REMOTE DEVICE CHECK  Result Date: 02/08/2023 Scheduled remote reviewed. Normal  device function.  Next remote 91 days. ML, CVRS  CUP PACEART INCLINIC DEVICE CHECK  Result Date: 01/23/2023 Pacemaker check in clinic. Normal device function. Thresholds, sensing, impedances consistent with previous measurements. Device programmed to maximize longevity. AT/AF burden 0%, 3 short PMT device corrected events, no high ventricular rates noted. Device programmed at appropriate safety margins. Histogram distribution appropriate for patient activity level. Device programmed to optimize intrinsic conduction. Estimated longevity 2.7-3.5 years. Patient enrolled in remote follow-up. Patient education  completed.Syliva Overman, RN

## 2023-02-15 NOTE — Patient Instructions (Signed)
[facility]  Discharge Instructions  You were seen and examined today by Dr. Ellin Saba.  Dr. Ellin Saba discussed your most recent lab work which revealed that everything looks good and stable.  Dr. Ellin Saba wants you to start taking the Vitamin D 50,000 units every other week.  Follow-up as scheduled.    Thank you for choosing [facility] to provide your oncology and hematology care.   To afford each patient quality time with our provider, please arrive at least 15 minutes before your scheduled appointment time. You may need to reschedule your appointment if you arrive late (10 or more minutes). Arriving late affects you and other patients whose appointments are after yours.  Also, if you miss three or more appointments without notifying the office, you may be dismissed from the clinic at the provider's discretion.    Again, thank you for choosing [facility].  Our hope is that these requests will decrease the amount of time that you wait before being seen by our physicians.   If you have a lab appointment with the Cancer Center - please note that after April 8th, all labs will be drawn in the cancer center.  You do not have to check in or register with the main entrance as you have in the past but will complete your check-in at the cancer center.            _____________________________________________________________  Should you have questions after your visit to [facility], please contact our office at [phone] and follow the prompts.  Our office hours are 8:00 a.m. to 4:30 p.m. Monday - Thursday and 8:00 a.m. to 2:30 p.m. Friday.  Please note that voicemails left after 4:00 p.m. may not be returned until the following business day.  We are closed weekends and all major holidays.  You do have access to a nurse 24-7, just call the main number to the clinic [phone] and do not press any options,  hold on the line and a nurse will answer the phone.    For prescription refill requests, have your pharmacy contact our office and allow 72 hours.    Masks are no longer required in the cancer centers. If you would like for your care team to wear a mask while they are taking care of you, please let them know. You may have one support person who is at least 67 years old accompany you for your appointments.

## 2023-02-15 NOTE — Telephone Encounter (Signed)
Erleada refill approved.  Patient tolerating and is to continue therapy.

## 2023-02-19 ENCOUNTER — Inpatient Hospital Stay: Payer: 59 | Admitting: Hematology

## 2023-02-21 NOTE — Progress Notes (Signed)
Remote pacemaker transmission.   

## 2023-02-22 ENCOUNTER — Encounter: Payer: Self-pay | Admitting: Nurse Practitioner

## 2023-02-22 ENCOUNTER — Ambulatory Visit (INDEPENDENT_AMBULATORY_CARE_PROVIDER_SITE_OTHER): Payer: 59 | Admitting: Nurse Practitioner

## 2023-02-22 VITALS — BP 107/65 | HR 64 | Ht 72.0 in | Wt 284.6 lb

## 2023-02-22 DIAGNOSIS — E1165 Type 2 diabetes mellitus with hyperglycemia: Secondary | ICD-10-CM

## 2023-02-22 DIAGNOSIS — E559 Vitamin D deficiency, unspecified: Secondary | ICD-10-CM

## 2023-02-22 DIAGNOSIS — E669 Obesity, unspecified: Secondary | ICD-10-CM

## 2023-02-22 DIAGNOSIS — E782 Mixed hyperlipidemia: Secondary | ICD-10-CM

## 2023-02-22 DIAGNOSIS — E119 Type 2 diabetes mellitus without complications: Secondary | ICD-10-CM

## 2023-02-22 DIAGNOSIS — E890 Postprocedural hypothyroidism: Secondary | ICD-10-CM | POA: Diagnosis not present

## 2023-02-22 DIAGNOSIS — Z7984 Long term (current) use of oral hypoglycemic drugs: Secondary | ICD-10-CM

## 2023-02-22 DIAGNOSIS — E1169 Type 2 diabetes mellitus with other specified complication: Secondary | ICD-10-CM

## 2023-02-22 DIAGNOSIS — I10 Essential (primary) hypertension: Secondary | ICD-10-CM

## 2023-02-22 MED ORDER — METFORMIN HCL 500 MG PO TABS: 500.0000 mg | ORAL_TABLET | Freq: Every day | ORAL | 3 refills | Status: DC

## 2023-02-22 MED ORDER — LEVOTHYROXINE SODIUM 200 MCG PO TABS
200.0000 ug | ORAL_TABLET | Freq: Every day | ORAL | 3 refills | Status: DC
Start: 2023-02-22 — End: 2023-08-22

## 2023-02-22 MED ORDER — LEVOTHYROXINE SODIUM 25 MCG PO TABS: 25.0000 ug | ORAL_TABLET | ORAL | 3 refills | Status: DC

## 2023-02-22 MED ORDER — FARXIGA 5 MG PO TABS: 5.0000 mg | ORAL_TABLET | Freq: Every day | ORAL | 3 refills | Status: DC

## 2023-02-22 NOTE — Patient Instructions (Signed)

## 2023-02-22 NOTE — Progress Notes (Signed)
02/22/2023                                                 Endocrinology Follow Up  Subjective:    Patient ID: Peter Dunlap, male    DOB: April 16, 1956, PCP Fleet Contras, MD  Patient presents today for follow-up of hypothyroidism following radioactive iodine treatment, and type 2 diabetes.  Past Medical History:  Diagnosis Date   Arteriosclerotic cardiovascular disease (ASCVD) 2005   catheterization in 10/2010:50% mid LAD, diffuse distal disease, circumflex irregularities, large dominant RCA with a 50% ostial, 70% distal, 60% posterolateral and 70% PDA; normal EF   Arthritis    Benign prostatic hypertrophy    Bilateral carpal tunnel syndrome 07/03/2018   Cerebrovascular disease 2010   R. carotid endarterectomy; Duplex in 10/2010-widely patent ICAs, subtotal left vertebral-not thought to be contributing to symptoms   Cervical spine disease    CT in 2012-advanced degeneration and spondylosis with moderate spinal stenosis at C3-C6   CHF (congestive heart failure) (HCC)    Depression    Diabetes mellitus without complication (HCC)    Erectile dysfunction    Family history of breast cancer    Family history of cancer of mouth    Family history of CML (chronic monocytic leukemia)    Family history of lung cancer    Family history of ovarian cancer    Family history of stomach cancer    Gastroesophageal reflux disease    H/O hiatal hernia    H/O: substance abuse (HCC)    Cocaine, marijuana, alcohol.  Quit 2013.    Hyperlipidemia    Hypertension    Non-ST elevation myocardial infarction (NSTEMI), initial episode of care (HCC) 12/02/2013   DES LAD   Obesity    Prostate cancer (HCC)    Sleep apnea    CPAP   Tachy-brady syndrome (HCC)    a. 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 25, 2010    CEA   COLONOSCOPY WITH PROPOFOL N/A 06/24/2021   Procedure: COLONOSCOPY WITH PROPOFOL;  Surgeon: Jeani Hawking, MD;  Location: [facility] ENDOSCOPY;  Service: Endoscopy;  Laterality: N/A;   CORONARY ANGIOPLASTY WITH STENT PLACEMENT  12/03/2013   LAD 90%-->0% W/ Promus Premier DES 3.0 mm x 16 mm, CFX OK, RCA 40%, EF 70-75%   LEFT ATRIAL APPENDAGE OCCLUSION N/A 08/05/2015   Procedure: LEFT ATRIAL APPENDAGE OCCLUSION;  Surgeon: [name], MD;  Location: [facility] INVASIVE CV LAB;  Service: Cardiovascular;  Laterality: N/A;   LEFT HEART CATH AND CORONARY ANGIOGRAPHY N/A 09/13/2021   Procedure: LEFT HEART CATH AND CORONARY ANGIOGRAPHY;  Surgeon: Orbie Pyo, MD;  Location: [facility] CV [facility];  Service: Cardiovascular;  Laterality: N/A;   LEFT HEART CATHETERIZATION WITH CORONARY ANGIOGRAM Left 12/03/2013   Procedure: LEFT HEART CATHETERIZATION WITH CORONARY ANGIOGRAM;  Surgeon: Marykay Lex, MD;  Location: [facility] CATH LAB;  Service: Cardiovascular;  Laterality: Left;  LEFT HEART CATHETERIZATION WITH CORONARY ANGIOGRAM N/A 01/26/2014   Procedure: LEFT HEART CATHETERIZATION WITH CORONARY ANGIOGRAM;  Surgeon: Corky Crafts, MD;  Location: [facility] C[facility] LAB;  Service: Cardiovascular;  Laterality: N/A;   LEFT HEART CATHETERIZATION WITH CORONARY ANGIOGRAM N/A 08/03/2014   Procedure: LEFT HEART CATHETERIZATION WITH CORONARY ANGIOGRAM;  Surgeon: Kathleene Hazel, MD;  Location: [facility] CATH LAB;  Service: Cardiovascular;  Laterality: N/A;   PERCUTANEOUS CORONARY STENT INTERVENTION (PCI-S)  12/03/2013   Procedure: PERCUTANEOUS CORONARY STENT INTERVENTION (PCI-S);  Surgeon: Marykay Lex, MD;  Location: [facility] CATH LAB;  Service: Cardiovascular;;   PERMANENT PACEMAKER INSERTION N/A 09/18/2014   Procedure: PERMANENT PACEMAKER INSERTION;  Surgeon: Marinus Maw, MD;  Location: [facility] CATH LAB;  Service: Cardiovascular;  Laterality: N/A;   POLYPECTOMY  06/24/2021   Procedure: POLYPECTOMY;  Surgeon: Jeani Hawking, MD;  Location: [facility] ENDOSCOPY;  Service: Endoscopy;;   RADIOFREQUENCY ABLATION  2005   for PSVT   TEE WITHOUT CARDIOVERSION N/A 07/27/2015   Procedure: TRANSESOPHAGEAL ECHOCARDIOGRAM (TEE);  Surgeon: Lewayne Bunting, MD;  Location: [facility] ENDOSCOPY;  Service: Cardiovascular;  Laterality: N/A;   TEE WITHOUT CARDIOVERSION N/A 09/15/2015   Procedure: TRANSESOPHAGEAL ECHOCARDIOGRAM (TEE);  Surgeon: Vesta Mixer, MD;  Location: [facility] ENDOSCOPY;  Service: Cardiovascular;  Laterality: N/A;   Social History   Socioeconomic History   Marital status: Married    Spouse name: Not on file   Number of children: 0   Years of education: Not on file   Highest education level: Not on file  Occupational History   Occupation: Retired  Tobacco Use   Smoking status: Former    Current packs/day: 0.00    Average packs/day: 1 pack/day for 40.0 years (40.0 ttl pk-yrs)    Types: Cigarettes    Start date: 10/20/1972    Quit date: 10/10/2012    Years since quitting: 10.3   Smokeless tobacco: Never   Tobacco comments:    Quit in May.   Vaping Use   Vaping status: Never Used  Substance and Sexual Activity   Alcohol use: Yes    Alcohol/week: 0.0 standard drinks of alcohol    Comment: former drinker-- sober since 2013.    Drug use: No    Types: Cocaine    Comment: quit cocaine 10/2011   Sexual activity: Yes    Partners: Female  Other Topics Concern   Not on file  Social History Narrative   Lives in [place].   Social Determinants of Health   Financial Resource Strain: Low Risk  (04/19/2020)   Overall Financial Resource Strain (CARDIA)    Difficulty of Paying Living Expenses: Not hard at all  Food Insecurity: No Food Insecurity (04/19/2020)   Hunger Vital Sign    Worried About Running Out of Food in the Last Year: Never true    Ran Out of Food in the  Last Year: Never true  Transportation Needs: No Transportation Needs (04/19/2020)   PRAPARE - Administrator, Civil Service (Medical): No    Lack of Transportation (Non-Medical): No  Physical Activity: Inactive (04/19/2020)   Exercise Vital Sign    Days of Exercise per Week: 0 days    Minutes of Exercise per Session: 0 min  Stress: No Stress Concern Present (04/19/2020)   [employer] of Occupational Health - Occupational Stress Questionnaire    Feeling of Stress : Not at all  Social Connections: Moderately Isolated (04/19/2020)   Social Connection and Isolation Panel [NHANES]    Frequency  of Communication with Friends and Family: More than three times a week    Frequency of Social Gatherings with Friends and Family: Once a week    Attends Religious Services: Never    Database administrator or Organizations: No    Attends Banker Meetings: Never    Marital Status: Married   Outpatient Encounter Medications as of 02/22/2023  Medication Sig   allopurinol (ZYLOPRIM) 100 MG tablet TAKE 1 TABLET(100 MG) BY MOUTH DAILY   apalutamide (ERLEADA) 60 MG tablet TAKE 4 TABLETS (240 MG TOTAL) BY MOUTH DAILY. MAY BE TAKEN WITH OR WITHOUT FOOD. SWALLOW TABLETS WHOLE.   aspirin EC 325 MG tablet Take 1 tablet (325 mg total) daily by mouth.   Blood Glucose Monitoring Suppl (BLOOD GLUCOSE SYSTEM PAK) KIT Use as directed to monitor FSBS 1x daily. Dx: E11.9.   Cyanocobalamin (VITAMIN B 12) 500 MCG TABS Take 500 mcg by mouth in the morning.   diclofenac Sodium (VOLTAREN) 1 % GEL 2 g 2 (two) times daily.   fluticasone (FLONASE) 50 MCG/ACT nasal spray Place 2 sprays into both nostrils daily.   furosemide (LASIX) 20 MG tablet TAKE 3 TABLETS BY MOUTH DAILY   glucose blood (ONETOUCH ULTRA) test strip Use as instructed to monitor glucose twice daily.   isosorbide mononitrate (IMDUR) 30 MG 24 hr tablet Take 1 tablet (30 mg total) by mouth daily.   Lactulose 20 GM/30ML SOLN Take 30 mLs  (20 g total) by mouth daily as needed. Take 30 ml by mouth every 3 hours until you have bowel movement then daily as needed   Lancets (ONETOUCH DELICA PLUS LANCET30G) MISC USE EVERY DAY FOR GLUCOSE TESTING   loratadine (CLARITIN) 10 MG tablet TAKE 1 TABLET(10 MG) BY MOUTH DAILY AS NEEDED FOR ALLERGIES   magnesium oxide (MAG-OX) 400 (240 Mg) MG tablet TAKE 1 TABLET(400 MG) BY MOUTH TWICE DAILY   metroNIDAZOLE (FLAGYL) 500 MG tablet Take 4 tablets at once   naloxone (NARCAN) nasal spray 4 mg/0.1 mL Place 4 mg into the nose once as needed (accidental overdose).   nitroGLYCERIN (NITROSTAT) 0.4 MG SL tablet PLACE 1 TABLET UNDER THE TONGUE EVERY 5 MINUTES AS NEEDED FOR CHEST PAIN. CALL 911 AT THIRD DOSE IN 15 MINUTES   oxyCODONE-acetaminophen (PERCOCET) 10-325 MG tablet Take 1 tablet by mouth every 8 (eight) hours as needed for pain.   pantoprazole (PROTONIX) 40 MG tablet TAKE 1 TABLET(40 MG) BY MOUTH DAILY   potassium chloride SA (KLOR-CON M) 20 MEQ tablet Take 1 tablet (20 mEq total) by mouth daily.   rosuvastatin (CRESTOR) 40 MG tablet Take 40 mg by mouth every evening.   silodosin (RAPAFLO) 8 MG CAPS capsule TAKE 1 CAPSULE BY MOUTH TWICE DAILY   solifenacin (VESICARE) 10 MG tablet TAKE 1 TABLET(10 MG) BY MOUTH DAILY   sotalol (BETAPACE) 80 MG tablet Take 1 tablet (80 mg total) by mouth 2 (two) times daily.   Vibegron (GEMTESA) 75 MG TABS Take 1 tablet by mouth daily.   Vitamin D, Ergocalciferol, (DRISDOL) 1.25 MG (50000 UNIT) CAPS capsule TAKE 1 CAPSULE BY MOUTH EVERY 7 DAYS   [DISCONTINUED] FARXIGA 5 MG TABS tablet Take 5 mg by mouth daily.   [DISCONTINUED] glipiZIDE (GLUCOTROL XL) 5 MG 24 hr tablet TAKE 1 TABLET(5 MG) BY MOUTH DAILY WITH BREAKFAST   [DISCONTINUED] levothyroxine (SYNTHROID) 200 MCG tablet TAKE 1 TABLET(200 MCG) BY MOUTH EVERY DAY BEFORE BREAKFAST   [DISCONTINUED] levothyroxine (SYNTHROID) 25 MCG tablet Take 1 tablet (25 mcg  total) by mouth See admin instructions. Takes along with  200 mcg to total 225 mcg daily   [DISCONTINUED] metFORMIN (GLUCOPHAGE) 500 MG tablet Take 1 tablet (500 mg total) by mouth daily with breakfast.   FARXIGA 5 MG TABS tablet Take 1 tablet (5 mg total) by mouth daily.   levothyroxine (SYNTHROID) 200 MCG tablet Take 1 tablet (200 mcg total) by mouth daily before breakfast.   levothyroxine (SYNTHROID) 25 MCG tablet Take 1 tablet (25 mcg total) by mouth See admin instructions. Takes along with 200 mcg to total 225 mcg daily   metFORMIN (GLUCOPHAGE) 500 MG tablet Take 1 tablet (500 mg total) by mouth daily with breakfast.   No facility-administered encounter medications on file as of 02/22/2023.   ALLERGIES: Allergies  Allergen Reactions   Trazodone And Nefazodone     Nightmares   Lactose Intolerance (Gi) Other (See Comments)    UPSET STOMACH    VACCINATION STATUS: Immunization History  Administered Date(s) Administered   Fluad Quad(high Dose 65+) 03/24/2021   Influenza,inj,Quad PF,6+ Mos 02/17/2014, 03/09/2015, 03/10/2016, 03/27/2017, 03/20/2018, 03/04/2019, 03/15/2020   Moderna Sars-Covid-2 Vaccination 08/03/2019, 09/06/2019   Pneumococcal Polysaccharide-23 01/05/2014   Tdap 12/19/2010   Zoster Recombinant(Shingrix) 12/17/2019, 02/15/2020    Diabetes He presents for his follow-up diabetic visit. He has type 2 diabetes mellitus. His disease course has been improving. Hypoglycemia symptoms include dizziness. Pertinent negatives for hypoglycemia include no headaches, nervousness/anxiousness or tremors. Associated symptoms include fatigue. Pertinent negatives for diabetes include no chest pain, no foot paresthesias, no polydipsia, no polyuria and no weight loss. There are no hypoglycemic complications. Symptoms are stable. Diabetic complications include heart disease, impotence, nephropathy and peripheral neuropathy. Risk factors for coronary artery disease include hypertension, diabetes mellitus, dyslipidemia, male sex, obesity, tobacco exposure  and sedentary lifestyle. Current diabetic treatment includes oral agent (triple therapy) (was started on Farxiga by another provider). He is compliant with treatment most of the time. His weight is fluctuating minimally. He is following a generally healthy diet. When asked about meal planning, he reported none. He has not had a previous visit with a dietitian. He never participates in exercise. His home blood glucose trend is decreasing steadily. His breakfast blood glucose range is generally 110-130 mg/dl. (He presents today after a long absence with his meter showing 2 readings in the last 3 months.  His most recent A1c on 7/15 was 6%.  He was started on Farxiga between visits by another provider and notes he has had some dizziness and checked glucose which was in the low 70s.  ) An ACE inhibitor/angiotensin II receptor blocker is not being taken. He does not see a podiatrist.Eye exam is not current.  Thyroid Problem Presents for follow-up (He was given RAI therapy on March 16, 2014. - he denies family history of thyroid dysfunction .) visit. Symptoms include fatigue. Patient reports no anxiety, cold intolerance, constipation, diarrhea, heat intolerance, leg swelling, palpitations, tremors, weight gain or weight loss. The symptoms have been stable.    Review of systems  Constitutional: + Minimally fluctuating body weight,  current Body mass index is 38.6 kg/m. , no fatigue, no subjective hyperthermia, no subjective hypothermia, undergoing dental work (been on antibiotics on and off- having teeth pulled soon) Eyes: no blurry vision, no xerophthalmia ENT: no sore throat, no nodules palpated in throat, no dysphagia/odynophagia, no hoarseness Cardiovascular: no chest pain, no shortness of breath, no palpitations, no leg swelling Respiratory: no cough, no shortness of breath Gastrointestinal: no nausea/vomiting/diarrhea Musculoskeletal: no muscle/joint aches  Skin: no rashes, no hyperemia Neurological:  no tremors, no numbness, no tingling, no dizziness Psychiatric: no depression, no anxiety    Objective:    BP 107/65 (BP Location: Left Arm, Patient Position: Sitting, Cuff Size: Large)   Pulse 64   Ht 6' (1.829 m)   Wt 284 lb 9.6 oz (129.1 kg)   BMI 38.60 kg/m   Wt Readings from Last 3 Encounters:  02/22/23 284 lb 9.6 oz (129.1 kg)  02/15/23 284 lb (128.8 kg)  01/23/23 289 lb (131.1 kg)    BP Readings from Last 3 Encounters:  02/22/23 107/65  02/15/23 (!) 104/59  01/23/23 (!) 88/50     Physical Exam- Limited  Constitutional:  Body mass index is 38.6 kg/m. , not in acute distress, normal state of mind Eyes:  EOMI, no exophthalmos Musculoskeletal: no gross deformities, strength intact in all four extremities, no gross restriction of joint movements Skin:  no rashes, no hyperemia Neurological: no tremor with outstretched hands   Diabetic Foot Exam - Simple   No data filed     Diabetic Labs (most recent): Lab Results  Component Value Date   HGBA1C 6.0 (H) 12/18/2022   HGBA1C 6.7 10/10/2021   HGBA1C 8.2 (A) 07/11/2021   MICROALBUR 150 03/09/2021   MICROALBUR 1.3 03/15/2020   MICROALBUR 4.1 11/05/2015     Lipid Panel ( most recent) Lipid Panel     Component Value Date/Time   CHOL 115 09/12/2021 0458   TRIG 69 09/12/2021 0458   HDL 39 (L) 09/12/2021 0458   CHOLHDL 2.9 09/12/2021 0458   VLDL 14 09/12/2021 0458   LDLCALC 62 09/12/2021 0458   LDLCALC 217 (H) 09/08/2020 0000    Recent Results (from the past 2160 hour(s))  BASIC METABOLIC PANEL WITH GFR     Status: Abnormal   Collection Time: 12/18/22  9:38 AM  Result Value Ref Range   Glucose, Bld 122 (H) 65 - 99 mg/dL    Comment: .            Fasting reference interval . For someone without known diabetes, a glucose value between 100 and 125 mg/dL is consistent with prediabetes and should be confirmed with a follow-up test. .    BUN 14 7 - 25 mg/dL   Creat 7.82 (H) 9.56 - 1.35 mg/dL   eGFR 54 (L)  > OR = 60 mL/min/1.19m2   BUN/Creatinine Ratio 10 6 - 22 (calc)   Sodium 142 135 - 146 mmol/L   Potassium 3.2 (L) 3.5 - 5.3 mmol/L   Chloride 108 98 - 110 mmol/L   CO2 22 20 - 32 mmol/L   Calcium 9.4 8.6 - 10.3 mg/dL  Hemoglobin O1H w/out eAG     Status: Abnormal   Collection Time: 12/18/22  9:38 AM  Result Value Ref Range   Hgb A1c MFr Bld 6.0 (H) <5.7 % of total Hgb    Comment: For someone without known diabetes, a hemoglobin  A1c value between 5.7% and 6.4% is consistent with prediabetes and should be confirmed with a  follow-up test. . For someone with known diabetes, a value <7% indicates that their diabetes is well controlled. A1c targets should be individualized based on duration of diabetes, age, comorbid conditions, and other considerations. . This assay result is consistent with an increased risk of diabetes. . Currently, no consensus exists regarding use of hemoglobin A1c for diagnosis of diabetes for children. . . This test was performed on the Roche cobas c503 platform. Effective  03/13/22, a change in test platforms from the Abbott Architect to the Roche cobas c503 may have shifted HbA1c results compared to historical results. Based on laboratory validation testing conducted at [facility], the Roche platform relative to the Abbott platform had an average increase in HbA1c value of < or = 0.3%. This difference is within accepted   variability established by the [facility]. Note that not all individuals will have had a shift in their results and direct comparisons between historical and current results for testing conducted on different platforms is not recommended.   Magnesium     Status: None   Collection Time: 12/18/22  9:38 AM  Result Value Ref Range   Magnesium 2.4 1.5 - 2.5 mg/dL  TSH     Status: None   Collection Time: 12/18/22  9:38 AM  Result Value Ref Range   TSH 2.23 0.40 - 4.50 mIU/L  T4, free     Status: None    Collection Time: 12/18/22  9:38 AM  Result Value Ref Range   Free T4 1.2 0.8 - 1.8 ng/dL  Vitamin M57     Status: Abnormal   Collection Time: 12/18/22  9:38 AM  Result Value Ref Range   Vitamin B-12 1,525 (H) 200 - 1,100 pg/mL  Folate     Status: None   Collection Time: 12/18/22  9:38 AM  Result Value Ref Range   Folate 10.2 ng/mL    Comment:                            Reference Range                            Low:           <3.4                            Borderline:    3.4-5.4                            Normal:        >5.4 .   Urinalysis, Routine w reflex microscopic     Status: Abnormal   Collection Time: 01/10/23 10:04 AM  Result Value Ref Range   Specific Gravity, UA 1.025 1.005 - 1.030   pH, UA 6.0 5.0 - 7.5   Color, UA Yellow Yellow   Appearance Ur Cloudy (A) Clear   Leukocytes,UA 1+ (A) Negative   Protein,UA 2+ (A) Negative/Trace   Glucose, UA Negative Negative   Ketones, UA Negative Negative   RBC, UA 1+ (A) Negative   Bilirubin, UA Negative Negative   Urobilinogen, Ur 1.0 0.2 - 1.0 mg/dL   Nitrite, UA Negative Negative   Microscopic Examination See below:     Comment: Microscopic was indicated and was performed.  Microscopic Examination     Status: Abnormal   Collection Time: 01/10/23 10:04 AM   Urine  Result Value Ref Range   WBC, UA 11-30 (A) 0 - 5 /hpf   RBC, Urine 3-10 (A) 0 - 2 /hpf   Epithelial Cells (non renal) 0-10 0 - 10 /hpf   Casts Present (A) None seen /lpf   Cast Type Hyaline casts N/A   Bacteria, UA Few (A) None seen/Few   Trichomonas, UA Present (A) None seen  CUP PACEART  INCLINIC DEVICE CHECK     Status: None   Collection Time: 01/23/23  8:31 PM  Result Value Ref Range   Date Time Interrogation Session [id number]    Pulse Generator Manufacturer [employer]    Pulse Gen Model 2240 Assurity    Pulse Gen Serial Number [id number]    Clinic Name [facility]    Implantable Pulse Generator Type Implantable Pulse Generator    Implantable  Pulse Generator Implant Date 45409811    Implantable Lead Manufacturer [employer]    Implantable Lead Model 1688TC Tendril SDX    Implantable Lead Serial Number [id number]    Implantable Lead Implant Date [id number]    Implantable Lead Location Detail 1 UNKNOWN    Implantable Lead Location [id number]    Implantable Lead Connection Status [id number]    Implantable Lead Manufacturer [employer]    Implantable Lead Model 1888TC Tendril ST Optim    Implantable Lead Serial Number [id number]    Implantable Lead Implant Date 84696295    Implantable Lead Location Detail 1 UNKNOWN    Implantable Lead Location [id number]    Implantable Lead Connection Status [id number]    Lead Channel Setting Sensing Sensitivity 2.0 mV   Lead Channel Setting Pacing Amplitude 2.0 V   Lead Channel Setting Pacing Pulse Width 0.5 ms   Lead Channel Setting Pacing Amplitude 2.5 V   Lead Channel Impedance Value 425.0 ohm   Lead Channel Sensing Intrinsic Amplitude 3.4 mV   Lead Channel Pacing Threshold Amplitude 0.75 V   Lead Channel Pacing Threshold Pulse Width 0.5 ms   Lead Channel Pacing Threshold Amplitude 0.75 V   Lead Channel Pacing Threshold Pulse Width 0.5 ms   Lead Channel Impedance Value 600.0 ohm   Lead Channel Sensing Intrinsic Amplitude 12.0 mV   Lead Channel Pacing Threshold Amplitude 0.75 V   Lead Channel Pacing Threshold Pulse Width 0.5 ms   Lead Channel Pacing Threshold Amplitude 0.75 V   Lead Channel Pacing Threshold Pulse Width 0.5 ms   Battery Status Unknown    Battery Remaining Longevity 39 mo   Battery Voltage 2.93 V   Brady Statistic RA Percent Paced 32.0 %   Brady Statistic RV Percent Paced 2.4 %   Eval Rhythm AS/VS 63   CUP PACEART REMOTE DEVICE CHECK     Status: None   Collection Time: 02/07/23  2:00 AM  Result Value Ref Range   Date Time Interrogation Session [id number]    Pulse Generator Manufacturer [employer]    Pulse Gen Model 2240 Assurity    Pulse Gen Serial Number [id number]    Clinic Name [facility]     Implantable Pulse Generator Type Implantable Pulse Generator    Implantable Pulse Generator Implant Date [id number]    Implantable Lead Manufacturer [facility]    Implantable Lead Model 1688TC Tendril SDX    Implantable Lead Serial Number [id number]    Implantable Lead Implant Date 36644034    Implantable Lead Location Detail 1 UNKNOWN    Implantable Lead Location [id number]    Implantable Lead Connection Status [id number]    Implantable Lead Manufacturer [facility]    Implantable Lead Model 1888TC Tendril ST Optim    Implantable Lead Serial Number [id number]    Implantable Lead Implant Date 74259563    Implantable Lead Location Detail 1 UNKNOWN    Implantable Lead Location [id number]    Implantable Lead Connection Status [id number]    Lead Channel Setting Sensing Sensitivity 2.0 mV   Lead Channel Setting Sensing Adaptation Mode Fixed Pacing  Lead Channel Setting Pacing Amplitude 2.0 V   Lead Channel Setting Pacing Pulse Width 0.5 ms   Lead Channel Setting Pacing Amplitude 2.5 V   Lead Channel Status NULL    Lead Channel Impedance Value 410 ohm   Lead Channel Sensing Intrinsic Amplitude 3.2 mV   Lead Channel Pacing Threshold Amplitude 0.75 V   Lead Channel Pacing Threshold Pulse Width 0.5 ms   Lead Channel Status NULL    Lead Channel Impedance Value 580 ohm   Lead Channel Sensing Intrinsic Amplitude 12.0 mV   Lead Channel Pacing Threshold Amplitude 0.75 V   Lead Channel Pacing Threshold Pulse Width 0.5 ms   Battery Status MOS    Battery Remaining Longevity 35 mo   Battery Remaining Percentage 28.0 %   Battery Voltage 2.93 V   Brady Statistic RA Percent Paced 30.0 %   Brady Statistic RV Percent Paced 1.3 %   Brady Statistic AP VP Percent 1.1 %   Brady Statistic AS VP Percent 1.0 %   Brady Statistic AP VS Percent 30.0 %   Brady Statistic AS VS Percent 69.0 %   Eval Rhythm SR at 62 bpm   VITAMIN D 25 Hydroxy (Vit-D Deficiency, Fractures)     Status: Abnormal   Collection Time: 02/12/23  2:57 PM  Result  Value Ref Range   Vit D, 25-Hydroxy 122.46 (H) 30 - 100 ng/mL    Comment: (NOTE) Vitamin D deficiency has been defined by the Institute of Medicine  and an Endocrine Society practice guideline as a level of serum 25-OH  vitamin D less than 20 ng/mL (1,2). The Endocrine Society went on to  further define vitamin D insufficiency as a level between 21 and 29  ng/mL (2).  1. IOM (Institute of Medicine). 2010. Dietary reference intakes for  calcium and D. [place]: [employer]. 2. Holick MF, Bink[place], Bischoff-Ferrari HA, et al. Evaluation,  treatment, and prevention of vitamin D deficiency: an Endocrine  Society clinical practice guideline, JCEM. 2011 Jul; 96(7): 1911-30.  Performed at [facility], [address] [address], [place], [place] [address]   Iron and TIBC     Status: None   Collection Time: 02/12/23  2:57 PM  Result Value Ref Range   Iron 88 45 - 182 ug/dL   TIBC 604 540 - 981 ug/dL   Saturation Ratios 25 17.9 - 39.5 %   UIBC 260 ug/dL    Comment: Performed at [facility], [address], [place], [place] [address]  Ferritin     Status: None   Collection Time: 02/12/23  2:57 PM  Result Value Ref Range   Ferritin 153 24 - 336 ng/mL    Comment: Performed at [facility], [address], [place], [place] [address]  PSA     Status: None   Collection Time: 02/12/23  2:57 PM  Result Value Ref Range   Prostatic Specific Antigen <0.01 0.00 - 4.00 ng/mL    Comment: (NOTE) While PSA levels of <=4.00 ng/ml are reported as reference range, some men with levels below 4.00 ng/ml can have prostate cancer and many men with PSA above 4.00 ng/ml do not have prostate cancer.  Other tests such as free PSA, age specific reference ranges, PSA velocity and PSA doubling time may be helpful especially in men less than 63 years old. Performed at [facility], [address], [place], [place] [address]   Comprehensive metabolic panel     Status: Abnormal  Collection Time: 02/12/23  2:57 PM  Result Value Ref Range   Sodium 139 135 - 145 mmol/L   Potassium 3.6 3.5 - 5.1 mmol/L   Chloride 109 98 - 111 mmol/L   CO2 21 (L) 22 - 32 mmol/L   Glucose, Bld 98 70 - 99 mg/dL    Comment: Glucose reference range applies only to samples taken after fasting for at least 8 hours.   BUN 17 8 - 23 mg/dL   Creatinine, Ser 1.61 0.61 - 1.24 mg/dL   Calcium 9.2 8.9 - 09.6 mg/dL   Total Protein 8.0 6.5 - 8.1 g/dL   Albumin 3.9 3.5 - 5.0 g/dL   AST 20 15 - 41 U/L   ALT 10 0 - 44 U/L   Alkaline Phosphatase 75 38 - 126 U/L   Total Bilirubin 0.9 0.3 - 1.2 mg/dL   GFR, Estimated >04 >54 mL/min    Comment: (NOTE) Calculated using the CKD-EPI Creatinine Equation (2021)    Anion gap 9 5 - 15    Comment: Performed at [facility], [address] [place], [place] [address]  CBC     Status: Abnormal   Collection Time: 02/12/23  2:57 PM  Result Value Ref Range   WBC 6.9 4.0 - 10.5 K/uL   RBC 3.97 (L) 4.22 - 5.81 MIL/uL   Hemoglobin 13.1 13.0 - 17.0 g/dL   HCT 91.4 78.2 - 95.6 %   MCV 100.8 (H) 80.0 - 100.0 fL   MCH 33.0 26.0 - 34.0 pg   MCHC 32.8 30.0 - 36.0 g/dL   RDW 21.3 08.6 - 57.8 %   Platelets 119 (L) 150 - 400 K/uL   nRBC 0.0 0.0 - 0.2 %    Comment: Performed at [facility], [address] [place], [place] [address]     Assessment & Plan:   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  He is advised to continue his current dose of Levothyroxine 225 mcg po daily before breakfast (close to max weight based calculation of total dose).    - We discussed about the correct intake of his thyroid hormone, on empty stomach at fasting, with water, separated by at least 30 minutes from breakfast and other medications,  and separated by more than 4 hours from calcium, iron, multivitamins, acid reflux medications (PPIs). -Patient is made aware of the fact that thyroid hormone replacement is  needed for life, dose to be adjusted by periodic monitoring of thyroid function tests.  2. Diabetes mellitus type 2 in obese [facility])  He presents today after a long absence with his meter showing 2 readings in the last 3 months.  His most recent A1c on 7/15 was 6%.  He was started on Farxiga between visits by another provider and notes he has had some dizziness and checked glucose which was in the low 70s.    - Nutritional counseling repeated at each appointment due to patients tendency to fall back in to old habits.  - The patient admits there is a room for improvement in their diet and drink choices. -  Suggestion is made for the patient to avoid simple carbohydrates from their diet including Cakes, Sweet Desserts / Pastries, Ice Cream, Soda (diet and regular), Sweet Tea, Candies, Chips, Cookies, Sweet Pastries, Store Bought Juices, Alcohol in Excess of 1-2 drinks a day, Artificial Sweeteners, Coffee Creamer, and "Sugar-free" Products. This will help patient to have stable blood glucose  profile and potentially avoid unintended weight gain.   - I encouraged the patient to switch to unprocessed or minimally processed complex starch and increased protein intake (animal or plant source), fruits, and vegetables.   - Patient is advised to stick to a routine mealtimes to eat 3 meals a day and avoid unnecessary snacks (to snack only to correct hypoglycemia).  - He is advised to continue Metformin 500 mg p.o. daily with breakfast and Farxiga 5 mg po daily.  I am having him stop his Glipizide given his account of more symptoms of hypoglycemia since starting the Farxiga to help avoid the hypoglycemia.  -He is encouraged to monitor glucose 1 times per day, before breakfast, and call the clinic if he has readings less than 70 or greater than 300 for 3 tests in a row.  -He will be considered for incretin therapy on subsequent visits.  3. Essential hypertension His blood pressure is controlled to target.  He  is advised to continue Lasix 60 mg po daily.  4.  Hyperlipidemia:  His most recent lipid panel from 09/12/21 shows controlled LDL at 62.  He is advised to continue Simvastatin 40 mg po daily at bedtime.  Side effects and precautions discussed with him.    5. Vitamin D Deficiency: His most recent vitamin D level on 06/20/21 shows improvement to 79.22.  He has completed replenishment with ergocalciferol.  No need for additional supplementation at this time.   - I advised patient to maintain close follow up with Fleet Contras, MD for primary care needs.      I spent  44  minutes in the care of the patient today including review of labs from CMP, Lipids, Thyroid Function, Hematology (current and previous including abstractions from other facilities); face-to-face time discussing  his blood glucose readings/logs, discussing hypoglycemia and hyperglycemia episodes and symptoms, medications doses, his options of short and long term treatment based on the latest standards of care / guidelines;  discussion about incorporating lifestyle medicine;  and documenting the encounter. Risk reduction counseling performed per USPSTF guidelines to reduce obesity and cardiovascular risk factors.     Please refer to Patient Instructions for Blood Glucose Monitoring and Insulin/Medications Dosing Guide"  in media tab for additional information. Please  also refer to " Patient Self Inventory" in the Media  tab for reviewed elements of pertinent patient history.  Lucious Groves participated in the discussions, expressed understanding, and voiced agreement with the above plans.  All questions were answered to his satisfaction. he is encouraged to contact clinic should he have any questions or concerns prior to his return visit.    Follow up plan: Return in about 4 months (around 06/24/2023) for Diabetes F/U with A1c in office, Previsit labs, Thyroid follow up, Bring meter and logs.   Ronny Bacon,  [facility] [address] [place], [place] [address] Phone: [phone] Fax: [phone]   02/22/2023, 8:32 AM

## 2023-02-26 ENCOUNTER — Other Ambulatory Visit (HOSPITAL_COMMUNITY): Payer: Self-pay

## 2023-03-18 ENCOUNTER — Encounter (HOSPITAL_COMMUNITY): Payer: Self-pay | Admitting: Hematology

## 2023-03-19 ENCOUNTER — Other Ambulatory Visit: Payer: Self-pay

## 2023-03-19 NOTE — Progress Notes (Signed)
Specialty Pharmacy Refill Coordination Note  Peter Dunlap is a 67 y.o. male contacted today regarding refills of specialty medication(s) Apalutamide   Patient requested Delivery   Delivery date: 03/28/23   Verified address: [address] [place] [address]   Medication will be filled on 03/27/23.

## 2023-04-03 ENCOUNTER — Encounter (HOSPITAL_COMMUNITY): Payer: Self-pay | Admitting: Hematology

## 2023-04-04 ENCOUNTER — Encounter (HOSPITAL_COMMUNITY): Payer: Self-pay | Admitting: Hematology

## 2023-04-16 NOTE — Progress Notes (Signed)
History of Present Illness: Peter Dunlap is here today for follow-up of prostate cancer and BPH with symptoms:    Prostate cancer:    He underwent TRUS/Bx on 8.15.2017. At that time, PSA was 9.09. Prostatic volume was 25 cc. 10/12 cores came back positive for adenocarcinoma as follows: 1 core revealed GS 3+3 5 cores revealed GS 3+4 4 cores revealed GS 4+3   He completed IMRT, 40 sessions, on 1.10.2018   8.24.2021: Repeat TRUS/Bx for elevating PSA trend following definitive Rx. 1/12 cores-from the left mid lateral region-came back positive for adenocarcinoma.  Grading was difficult due to his prior radiation.     10.15.2021: -CT A/P  with newly enlarged left iliac lymph nodes measuring 1.1 x 0.9 cm.  Hepatic steatosis with early signs of cirrhosis.   04/12/2020 -F-18 PSMA PET scan on showed radiotracer activity associated with enlarged left external iliac lymph node 9 mm with SUV 7.6.  Small left common iliac lymph node 6 mm, SUV 8.6.  Left periaortic lymph node 5 mm, SUV 6.3.  Lymph node between IVC and aorta at the level of right renal hilum, SUV 8.6.  Lymph node deep to the IVC measuring 6 mm, SUV 5.4.  No skeletal metastasis.   11.17.2021: Began ADT with [place].   7.5.2022: Continues Firmagon and Erleada  Most recent PSA is less than 0.1.  Testosterone level 5.   11.9.2022: PSA < 0.01. T level castrate.  Continues [place]   7.18.2023: PSA is 0.01, testosterone level still castrate.  Continues on [place].    11.21.2023: Most recent PSA in September less than 0.01, nadir level for him.  He did experience hematuria in September-passed a clot,   5.21.2024: PSA 2 months ago was still undetectable.  Still with urinary frequency, urgency and urgency incontinence as well as nocturia.  On Rapaflo twice a day as well as Solifenacin 10 mg.   8.7.2024:  He was switched to leuprolide 45 mg from his monthly Firmagon on the 18th of last month.  He is much more  comfortable with this.  Most recent PSA was undetectable.  At his last visit he was started on Gemtesa in addition to his Solifenacin.    11.11.2024:     BPH   11.12.2024:     Past Medical History:  Diagnosis Date   Arteriosclerotic cardiovascular disease (ASCVD) 2005   catheterization in 10/2010:50% mid LAD, diffuse distal disease, circumflex irregularities, large dominant RCA with a 50% ostial, 70% distal, 60% posterolateral and 70% PDA; normal EF   Arthritis    Benign prostatic hypertrophy    Bilateral carpal tunnel syndrome 07/03/2018   Cerebrovascular disease 2010   R. carotid endarterectomy; Duplex in 10/2010-widely patent ICAs, subtotal left vertebral-not thought to be contributing to symptoms   Cervical spine disease    CT in 2012-advanced degeneration and spondylosis with moderate spinal stenosis at C3-C6   CHF (congestive heart failure) (HCC)    Depression    Diabetes mellitus without complication (HCC)    Erectile dysfunction    Family history of breast cancer    Family history of cancer of mouth    Family history of CML (chronic monocytic leukemia)    Family history of lung cancer    Family history of ovarian cancer    Family history of stomach cancer    Gastroesophageal reflux disease    H/O hiatal hernia    H/O: substance abuse (HCC)    Cocaine, marijuana, alcohol.  Quit 2013.  Hyperlipidemia    Hypertension    Non-ST elevation myocardial infarction (NSTEMI), initial episode of care (HCC) 12/02/2013   DES LAD   Obesity    Prostate cancer (HCC)    Sleep apnea    CPAP   Tachy-brady syndrome (HCC)    a. 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 25, 2010    CEA   COLONOSCOPY WITH PROPOFOL N/A 06/24/2021   Procedure: COLONOSCOPY WITH PROPOFOL;  Surgeon: Jeani Hawking, MD;  Location: [facility] ENDOSCOPY;  Service: Endoscopy;  Laterality: N/A;   CORONARY ANGIOPLASTY WITH STENT PLACEMENT  12/03/2013   LAD 90%-->0% W/ Promus Premier DES 3.0 mm x 16 mm, CFX OK, RCA 40%, EF 70-75%   LEFT ATRIAL APPENDAGE OCCLUSION N/A 08/05/2015   Procedure: LEFT ATRIAL APPENDAGE OCCLUSION;  Surgeon: [name], MD;  Location: [facility] INVASIVE CV LAB;  Service: Cardiovascular;  Laterality: N/A;   LEFT HEART CATH AND CORONARY ANGIOGRAPHY N/A 09/13/2021   Procedure: LEFT HEART CATH AND CORONARY ANGIOGRAPHY;  Surgeon: Orbie Pyo, MD;  Location: [facility] INVASIVE CV LAB;  Service: Cardiovascular;  Laterality: N/A;   LEFT HEART CATHETERIZATION WITH CORONARY ANGIOGRAM Left 12/03/2013   Procedure: LEFT HEART CATHETERIZATION WITH CORONARY ANGIOGRAM;  Surgeon: Marykay Lex, MD;  Location: [facility] CATH LAB;  Service: Cardiovascular;  Laterality: Left;   LEFT HEART CATHETERIZATION WITH CORONARY ANGIOGRAM N/A 01/26/2014   Procedure: LEFT HEART CATHETERIZATION WITH CORONARY ANGIOGRAM;  Surgeon: Corky Crafts, MD;  Location: [facility] CATH LAB;  Service: Cardiovascular;  Laterality: N/A;   LEFT HEART CATHETERIZATION WITH CORONARY ANGIOGRAM N/A 08/03/2014   Procedure: LEFT HEART CATHETERIZATION WITH CORONARY ANGIOGRAM;  Surgeon: Kathleene Hazel, MD;  Location: [facility] C[facility] LAB;  Service: Cardiovascular;  Laterality: N/A;   PERCUTANEOUS CORONARY STENT INTERVENTION (PCI-S)  12/03/2013   Procedure: PERCUTANEOUS CORONARY STENT INTERVENTION (PCI-S);  Surgeon: Marykay Lex, MD;  Location: [facility] CATH LAB;  Service: Cardiovascular;;   PERMANENT PACEMAKER INSERTION N/A 09/18/2014   Procedure: PERMANENT PACEMAKER INSERTION;  Surgeon: Marinus Maw, MD;  Location: [facility] CATH LAB;  Service: Cardiovascular;  Laterality: N/A;   POLYPECTOMY  06/24/2021    Procedure: POLYPECTOMY;  Surgeon: Jeani Hawking, MD;  Location: [facility] ENDOSCOPY;  Service: Endoscopy;;   RADIOFREQUENCY ABLATION  2005   for PSVT   TEE WITHOUT CARDIOVERSION N/A 07/27/2015   Procedure: TRANSESOPHAGEAL ECHOCARDIOGRAM (TEE);  Surgeon: Lewayne Bunting, MD;  Location: [facility] ENDOSCOPY;  Service: Cardiovascular;  Laterality: N/A;   TEE WITHOUT CARDIOVERSION N/A 09/15/2015   Procedure: TRANSESOPHAGEAL ECHOCARDIOGRAM (TEE);  Surgeon: Vesta Mixer, MD;  Location: [facility] ENDOSCOPY;  Service: Cardiovascular;  Laterality: N/A;    Home Medications:  Allergies as of 04/17/2023       Reactions   Trazodone And Nefazodone    Nightmares   Lactose Intolerance (gi) Other (See Comments)   UPSET STOMACH        Medication List        Accurate as of April 16, 2023  1:16 PM. If you have any questions, ask your nurse or doctor.  allopurinol 100 MG tablet Commonly known as: ZYLOPRIM TAKE 1 TABLET(100 MG) BY MOUTH DAILY   aspirin EC 325 MG tablet Take 1 tablet (325 mg total) daily by mouth.   Blood Glucose System Pak Kit Use as directed to monitor FSBS 1x daily. Dx: E11.9.   diclofenac Sodium 1 % Gel Commonly known as: VOLTAREN 2 g 2 (two) times daily.   Erleada 60 MG tablet Generic drug: apalutamide TAKE 4 TABLETS (240 MG TOTAL) BY MOUTH DAILY. MAY BE TAKEN WITH OR WITHOUT FOOD. SWALLOW TABLETS WHOLE.   Farxiga 5 MG Tabs tablet Generic drug: dapagliflozin propanediol Take 1 tablet (5 mg total) by mouth daily.   fluticasone 50 MCG/ACT nasal spray Commonly known as: FLONASE Place 2 sprays into both nostrils daily.   furosemide 20 MG tablet Commonly known as: LASIX TAKE 3 TABLETS BY MOUTH DAILY   Gemtesa 75 MG Tabs Generic drug: Vibegron Take 1 tablet by mouth daily.   isosorbide mononitrate 30 MG 24 hr tablet Commonly known as: IMDUR Take 1 tablet (30 mg total) by mouth daily.   Lactulose 20 GM/30ML Soln Take 30 mLs (20 g total) by mouth daily as needed.  Take 30 ml by mouth every 3 hours until you have bowel movement then daily as needed   levothyroxine 25 MCG tablet Commonly known as: SYNTHROID Take 1 tablet (25 mcg total) by mouth See admin instructions. Takes along with 200 mcg to total 225 mcg daily   levothyroxine 200 MCG tablet Commonly known as: SYNTHROID Take 1 tablet (200 mcg total) by mouth daily before breakfast.   loratadine 10 MG tablet Commonly known as: CLARITIN TAKE 1 TABLET(10 MG) BY MOUTH DAILY AS NEEDED FOR ALLERGIES   magnesium oxide 400 (240 Mg) MG tablet Commonly known as: MAG-OX TAKE 1 TABLET(400 MG) BY MOUTH TWICE DAILY   metFORMIN 500 MG tablet Commonly known as: GLUCOPHAGE Take 1 tablet (500 mg total) by mouth daily with breakfast.   metroNIDAZOLE 500 MG tablet Commonly known as: Flagyl Take 4 tablets at once   naloxone 4 MG/0.1ML Liqd nasal spray kit Commonly known as: NARCAN Place 4 mg into the nose once as needed (accidental overdose).   nitroGLYCERIN 0.4 MG SL tablet Commonly known as: NITROSTAT PLACE 1 TABLET UNDER THE TONGUE EVERY 5 MINUTES AS NEEDED FOR CHEST PAIN. CALL 911 AT THIRD DOSE IN 15 MINUTES   OneTouch Delica Plus Lancet30G Misc USE EVERY DAY FOR GLUCOSE TESTING   OneTouch Ultra test strip Generic drug: glucose blood Use as instructed to monitor glucose twice daily.   oxyCODONE-acetaminophen 10-325 MG tablet Commonly known as: PERCOCET Take 1 tablet by mouth every 8 (eight) hours as needed for pain.   pantoprazole 40 MG tablet Commonly known as: PROTONIX TAKE 1 TABLET(40 MG) BY MOUTH DAILY   potassium chloride SA 20 MEQ tablet Commonly known as: KLOR-CON M Take 1 tablet (20 mEq total) by mouth daily.   rosuvastatin 40 MG tablet Commonly known as: CRESTOR Take 40 mg by mouth every evening.   silodosin 8 MG Caps capsule Commonly known as: RAPAFLO TAKE 1 CAPSULE BY MOUTH TWICE DAILY   solifenacin 10 MG tablet Commonly known as: VESICARE TAKE 1 TABLET(10 MG) BY  MOUTH DAILY   sotalol 80 MG tablet Commonly known as: BETAPACE Take 1 tablet (80 mg total) by mouth 2 (two) times daily.   Vitamin B 12 500 MCG Tabs Take 500 mcg by mouth in the morning.   Vitamin D (Ergocalciferol) 1.25 MG (50000 UNIT) Caps  capsule Commonly known as: DRISDOL TAKE 1 CAPSULE BY MOUTH EVERY 7 DAYS        Allergies:  Allergies  Allergen Reactions   Trazodone And Nefazodone     Nightmares   Lactose Intolerance (Gi) Other (See Comments)    UPSET STOMACH     Family History  Problem Relation Age of Onset   Hypertension Mother        Cerebrovascular disease   Diabetes Mother    Coronary artery disease Father 40   Diabetes type II Father    Hypertension Father    Heart attack Father    Diabetes Brother    Hypertension Brother    Diabetes Sister    Hypertension Sister    Heart attack Sister 42   Leukemia Sister 79       CML   Breast cancer Maternal Grandmother        dx 70s   Lung cancer Maternal Uncle        dx >50, smoker   Cancer Cousin        mouth cancer, dx 68s, no smoking/chew tobacco hx (maternal 1st cousin)   Ovarian cancer Cousin        dx <50 (maternal 1st cousin)   Stomach cancer Cousin        dx 30s (maternal 1st cousin)   Cancer Cousin        type unknown to pt, dx >50 (paternal 1st cousin)    Social History:  reports that he quit smoking about 10 years ago. His smoking use included cigarettes. He started smoking about 50 years ago. He has a 40 pack-year smoking history. He has never used smokeless tobacco. He reports current alcohol use. He reports that he does not use drugs.  ROS: A complete review of systems was performed.  All systems are negative except for pertinent findings as noted.  Physical Exam:  Vital signs in last 24 hours: There were no vitals taken for this visit. Constitutional:  Alert and oriented, No acute distress Cardiovascular: Regular rate  Respiratory: Normal respiratory effort Neurologic: Grossly intact,  no focal deficits Psychiatric: Normal mood and affect  I have reviewed prior pt notes  I have reviewed notes from referring/previous physicians--Dr. Ellin Saba  I have reviewed urinalysis results- trichomonas seen today  I have independently reviewed prior imaging  I have reviewed prior PSA results    Impression/Assessment:  1.  Trichomonas  2.  Castrate sensitive metastatic prostate cancer, on leuprolide endocrine needed, doing quite well  3.  Lower urinary tract symptoms, on Rapaflo, Gemtesa samples as well as Solifenacin, doing well  Plan:

## 2023-04-17 ENCOUNTER — Other Ambulatory Visit (HOSPITAL_COMMUNITY): Payer: Self-pay

## 2023-04-17 ENCOUNTER — Ambulatory Visit: Payer: 59 | Admitting: Urology

## 2023-04-17 ENCOUNTER — Encounter: Payer: Self-pay | Admitting: Urology

## 2023-04-17 VITALS — BP 138/81 | HR 73

## 2023-04-17 DIAGNOSIS — Z79899 Other long term (current) drug therapy: Secondary | ICD-10-CM | POA: Diagnosis not present

## 2023-04-17 DIAGNOSIS — R351 Nocturia: Secondary | ICD-10-CM | POA: Diagnosis not present

## 2023-04-17 DIAGNOSIS — N401 Enlarged prostate with lower urinary tract symptoms: Secondary | ICD-10-CM | POA: Diagnosis not present

## 2023-04-17 DIAGNOSIS — R350 Frequency of micturition: Secondary | ICD-10-CM

## 2023-04-17 DIAGNOSIS — M8580 Other specified disorders of bone density and structure, unspecified site: Secondary | ICD-10-CM

## 2023-04-17 DIAGNOSIS — C61 Malignant neoplasm of prostate: Secondary | ICD-10-CM | POA: Diagnosis not present

## 2023-04-17 DIAGNOSIS — Z191 Hormone sensitive malignancy status: Secondary | ICD-10-CM

## 2023-04-17 LAB — URINALYSIS, ROUTINE W REFLEX MICROSCOPIC
Bilirubin, UA: NEGATIVE
Leukocytes,UA: NEGATIVE
Nitrite, UA: NEGATIVE
RBC, UA: NEGATIVE
Specific Gravity, UA: 1.015 (ref 1.005–1.030)
Urobilinogen, Ur: 1 mg/dL (ref 0.2–1.0)
pH, UA: 6 (ref 5.0–7.5)

## 2023-04-17 NOTE — Progress Notes (Signed)
Specialty Pharmacy Refill Coordination Note  Peter Dunlap is a 67 y.o. male contacted today regarding refills of specialty medication(s) Apalutamide   Patient requested Delivery   Delivery date: 04/27/23   Verified address: [address], [place], [place]   Medication will be filled on 04/26/2023.

## 2023-04-17 NOTE — Progress Notes (Signed)
Specialty Pharmacy Ongoing Clinical Assessment Note  Peter Dunlap is a 67 y.o. male who is being followed by the specialty pharmacy service for RxSp Oncology   Patient's specialty medication(s) reviewed today: Apalutamide   Missed doses in the last 4 weeks: 0   Patient/Caregiver did not have any additional questions or concerns.   Therapeutic benefit summary: Patient is achieving benefit   Adverse events/side effects summary: No adverse events/side effects   Patient's therapy is appropriate to: Continue    Goals Addressed             This Visit's Progress    Slow Disease Progression       Patient is on track. Patient will maintain adherence.  PSA remains <0.01 ng/mL.          Follow up:  6 months  Servando Snare Specialty Pharmacist

## 2023-04-18 ENCOUNTER — Ambulatory Visit (INDEPENDENT_AMBULATORY_CARE_PROVIDER_SITE_OTHER): Payer: 59 | Admitting: Podiatry

## 2023-04-18 DIAGNOSIS — M2142 Flat foot [pes planus] (acquired), left foot: Secondary | ICD-10-CM

## 2023-04-18 DIAGNOSIS — E1149 Type 2 diabetes mellitus with other diabetic neurological complication: Secondary | ICD-10-CM | POA: Diagnosis not present

## 2023-04-18 DIAGNOSIS — M2010 Hallux valgus (acquired), unspecified foot: Secondary | ICD-10-CM | POA: Diagnosis not present

## 2023-04-18 DIAGNOSIS — M2141 Flat foot [pes planus] (acquired), right foot: Secondary | ICD-10-CM

## 2023-04-18 NOTE — Progress Notes (Signed)
This patient presents to the office for diabetic foot exam.  Patient is diagnosed with Diabetic neuropathy but he is taking no medicine.  This patient says there is no pain or discomfort in her feet.  No history of infection or drainage.  This patient presents to the office for foot exam due to having a history of diabetes.  Vascular  Dorsalis pedis and posterior tibial pulses are weakly palpable/absent    B/L.  Capillary return  WNL.  Temperature gradient is  WNL.  Skin turgor  WNL  Sensorium  Senn Weinstein monofilament wire diminished/absent.   Normal tactile sensation.  Nail Exam  Patient has normal nails with no evidence of bacterial or fungal infection.  Orthopedic  Exam  Muscle tone and muscle strength  WNL.  No limitations of motion feet  B/L.  No crepitus or joint effusion noted.  Foot type is unremarkable and digits show no abnormalities.  HAV B/L.  Pes planus   Skin  No open lesions.  Normal skin texture and turgor.   Diabetes with no complications  Diabetic foot exam was performed.  There is evidence of vascular or neurologic pathology.  RTC  patient qualifies for diabetic shoes due to DPN, HAV and pes planus.   Helane Gunther DPM

## 2023-04-18 NOTE — Addendum Note (Signed)
Addended by: Helane Gunther on: 04/18/2023 01:00 PM   Modules accepted: Orders

## 2023-04-23 ENCOUNTER — Ambulatory Visit: Payer: 59

## 2023-04-23 DIAGNOSIS — M2010 Hallux valgus (acquired), unspecified foot: Secondary | ICD-10-CM

## 2023-04-23 DIAGNOSIS — E1149 Type 2 diabetes mellitus with other diabetic neurological complication: Secondary | ICD-10-CM

## 2023-04-23 DIAGNOSIS — M2141 Flat foot [pes planus] (acquired), right foot: Secondary | ICD-10-CM

## 2023-04-23 NOTE — Progress Notes (Signed)
Patient presents to the office today for diabetic shoe and insole measuring.  Patient was measured with brannock device to determine size and width for 1 pair of extra depth shoes and foam casted for 3 pair of insoles.   Documentation of medical necessity will be sent to patient's treating diabetic doctor to verify and sign.   Patient's diabetic provider: Whitney   Shoes and insoles will be ordered at that time and patient will be notified for an appointment for fitting when they arrive.   Shoe size (per patient): 13 Brannock measurement: 12 Patient shoe selection- Shoe choice:   [id number] / [id number] Shoe size ordered: 13WD Financials signed  Addison Bailey CPed, CFo, CFm

## 2023-04-26 ENCOUNTER — Other Ambulatory Visit: Payer: Self-pay | Admitting: Urology

## 2023-04-30 ENCOUNTER — Telehealth: Payer: Self-pay

## 2023-04-30 NOTE — Telephone Encounter (Signed)
Pt called stating [facility] stated they had faxed over paperwork for diabetic shoes. Have you seen any paperwork?

## 2023-05-08 ENCOUNTER — Inpatient Hospital Stay: Payer: 59 | Attending: Hematology

## 2023-05-08 DIAGNOSIS — Z801 Family history of malignant neoplasm of trachea, bronchus and lung: Secondary | ICD-10-CM | POA: Diagnosis not present

## 2023-05-08 DIAGNOSIS — Z87891 Personal history of nicotine dependence: Secondary | ICD-10-CM | POA: Insufficient documentation

## 2023-05-08 DIAGNOSIS — K7460 Unspecified cirrhosis of liver: Secondary | ICD-10-CM | POA: Diagnosis not present

## 2023-05-08 DIAGNOSIS — E785 Hyperlipidemia, unspecified: Secondary | ICD-10-CM | POA: Insufficient documentation

## 2023-05-08 DIAGNOSIS — C778 Secondary and unspecified malignant neoplasm of lymph nodes of multiple regions: Secondary | ICD-10-CM | POA: Insufficient documentation

## 2023-05-08 DIAGNOSIS — Z79899 Other long term (current) drug therapy: Secondary | ICD-10-CM | POA: Insufficient documentation

## 2023-05-08 DIAGNOSIS — E119 Type 2 diabetes mellitus without complications: Secondary | ICD-10-CM | POA: Insufficient documentation

## 2023-05-08 DIAGNOSIS — R519 Headache, unspecified: Secondary | ICD-10-CM | POA: Insufficient documentation

## 2023-05-08 DIAGNOSIS — E079 Disorder of thyroid, unspecified: Secondary | ICD-10-CM | POA: Diagnosis not present

## 2023-05-08 DIAGNOSIS — C61 Malignant neoplasm of prostate: Secondary | ICD-10-CM | POA: Insufficient documentation

## 2023-05-08 DIAGNOSIS — D649 Anemia, unspecified: Secondary | ICD-10-CM | POA: Diagnosis not present

## 2023-05-08 DIAGNOSIS — I10 Essential (primary) hypertension: Secondary | ICD-10-CM | POA: Insufficient documentation

## 2023-05-08 DIAGNOSIS — Z8379 Family history of other diseases of the digestive system: Secondary | ICD-10-CM | POA: Insufficient documentation

## 2023-05-08 DIAGNOSIS — N400 Enlarged prostate without lower urinary tract symptoms: Secondary | ICD-10-CM | POA: Diagnosis not present

## 2023-05-08 DIAGNOSIS — E559 Vitamin D deficiency, unspecified: Secondary | ICD-10-CM | POA: Insufficient documentation

## 2023-05-08 DIAGNOSIS — Z833 Family history of diabetes mellitus: Secondary | ICD-10-CM | POA: Insufficient documentation

## 2023-05-08 DIAGNOSIS — Z808 Family history of malignant neoplasm of other organs or systems: Secondary | ICD-10-CM | POA: Diagnosis not present

## 2023-05-08 DIAGNOSIS — Z8249 Family history of ischemic heart disease and other diseases of the circulatory system: Secondary | ICD-10-CM | POA: Diagnosis not present

## 2023-05-08 DIAGNOSIS — M8580 Other specified disorders of bone density and structure, unspecified site: Secondary | ICD-10-CM | POA: Insufficient documentation

## 2023-05-08 DIAGNOSIS — Z803 Family history of malignant neoplasm of breast: Secondary | ICD-10-CM | POA: Insufficient documentation

## 2023-05-08 DIAGNOSIS — I252 Old myocardial infarction: Secondary | ICD-10-CM | POA: Diagnosis not present

## 2023-05-08 DIAGNOSIS — Z8049 Family history of malignant neoplasm of other genital organs: Secondary | ICD-10-CM | POA: Insufficient documentation

## 2023-05-08 DIAGNOSIS — D696 Thrombocytopenia, unspecified: Secondary | ICD-10-CM | POA: Insufficient documentation

## 2023-05-08 LAB — CBC WITH DIFFERENTIAL/PLATELET
Abs Immature Granulocytes: 0.02 10*3/uL (ref 0.00–0.07)
Basophils Absolute: 0.1 10*3/uL (ref 0.0–0.1)
Basophils Relative: 2 %
Eosinophils Absolute: 0.1 10*3/uL (ref 0.0–0.5)
Eosinophils Relative: 2 %
HCT: 39.4 % (ref 39.0–52.0)
Hemoglobin: 13.1 g/dL (ref 13.0–17.0)
Immature Granulocytes: 0 %
Lymphocytes Relative: 28 %
Lymphs Abs: 1.7 10*3/uL (ref 0.7–4.0)
MCH: 33.7 pg (ref 26.0–34.0)
MCHC: 33.2 g/dL (ref 30.0–36.0)
MCV: 101.3 fL — ABNORMAL HIGH (ref 80.0–100.0)
Monocytes Absolute: 0.6 10*3/uL (ref 0.1–1.0)
Monocytes Relative: 10 %
Neutro Abs: 3.5 10*3/uL (ref 1.7–7.7)
Neutrophils Relative %: 58 %
Platelets: 131 10*3/uL — ABNORMAL LOW (ref 150–400)
RBC: 3.89 MIL/uL — ABNORMAL LOW (ref 4.22–5.81)
RDW: 14.1 % (ref 11.5–15.5)
WBC: 6 10*3/uL (ref 4.0–10.5)
nRBC: 0 % (ref 0.0–0.2)

## 2023-05-08 LAB — PSA: Prostatic Specific Antigen: 0.01 ng/mL (ref 0.00–4.00)

## 2023-05-08 LAB — COMPREHENSIVE METABOLIC PANEL
ALT: 11 U/L (ref 0–44)
AST: 20 U/L (ref 15–41)
Albumin: 3.6 g/dL (ref 3.5–5.0)
Alkaline Phosphatase: 108 U/L (ref 38–126)
Anion gap: 12 (ref 5–15)
BUN: 10 mg/dL (ref 8–23)
CO2: 19 mmol/L — ABNORMAL LOW (ref 22–32)
Calcium: 9.2 mg/dL (ref 8.9–10.3)
Chloride: 108 mmol/L (ref 98–111)
Creatinine, Ser: 1.06 mg/dL (ref 0.61–1.24)
GFR, Estimated: >60 mL/min (ref >60)
Glucose, Bld: 189 mg/dL — ABNORMAL HIGH (ref 70–99)
Potassium: 3.9 mmol/L (ref 3.5–5.1)
Sodium: 139 mmol/L (ref 135–145)
Total Bilirubin: 0.7 mg/dL (ref <1.2)
Total Protein: 7.2 g/dL (ref 6.5–8.1)

## 2023-05-08 LAB — VITAMIN D 25 HYDROXY (VIT D DEFICIENCY, FRACTURES): Vit D, 25-Hydroxy: 51.23 ng/mL (ref 30–100)

## 2023-05-08 NOTE — Progress Notes (Signed)
12/3 shoes are in wtg on inserts to arrive inserts were released to lab on 12/1  Peter Dunlap

## 2023-05-09 ENCOUNTER — Ambulatory Visit (INDEPENDENT_AMBULATORY_CARE_PROVIDER_SITE_OTHER): Payer: Medicaid Other

## 2023-05-09 DIAGNOSIS — I495 Sick sinus syndrome: Secondary | ICD-10-CM

## 2023-05-09 LAB — CUP PACEART REMOTE DEVICE CHECK
Battery Remaining Longevity: 31 mo
Battery Remaining Percentage: 26 %
Battery Voltage: 2.93 V
Brady Statistic AP VP Percent: 8 %
Brady Statistic AP VS Percent: 31 %
Brady Statistic AS VP Percent: 1.4 %
Brady Statistic AS VS Percent: 58 %
Brady Statistic RA Percent Paced: 36 %
Brady Statistic RV Percent Paced: 9.3 %
Date Time Interrogation Session: 20241204044024
Implantable Lead Connection Status: 753985
Implantable Lead Connection Status: 753985
Implantable Lead Implant Date: 20160415
Implantable Lead Implant Date: 20160415
Implantable Lead Location: 753859
Implantable Lead Location: 753860
Implantable Pulse Generator Implant Date: 20160415
Lead Channel Impedance Value: 410 Ohm
Lead Channel Impedance Value: 590 Ohm
Lead Channel Pacing Threshold Amplitude: 0.75 V
Lead Channel Pacing Threshold Amplitude: 0.75 V
Lead Channel Pacing Threshold Pulse Width: 0.5 ms
Lead Channel Pacing Threshold Pulse Width: 0.5 ms
Lead Channel Sensing Intrinsic Amplitude: 12 mV
Lead Channel Sensing Intrinsic Amplitude: 3.4 mV
Lead Channel Setting Pacing Amplitude: 2 V
Lead Channel Setting Pacing Amplitude: 2.5 V
Lead Channel Setting Pacing Pulse Width: 0.5 ms
Lead Channel Setting Sensing Sensitivity: 2 mV
Pulse Gen Model: 2240
Pulse Gen Serial Number: 7756161

## 2023-05-16 ENCOUNTER — Inpatient Hospital Stay (HOSPITAL_BASED_OUTPATIENT_CLINIC_OR_DEPARTMENT_OTHER): Payer: 59 | Admitting: Hematology

## 2023-05-16 VITALS — BP 93/57 | HR 69 | Temp 96.6°F | Resp 18 | Wt 292.0 lb

## 2023-05-16 DIAGNOSIS — C61 Malignant neoplasm of prostate: Secondary | ICD-10-CM

## 2023-05-16 DIAGNOSIS — Z191 Hormone sensitive malignancy status: Secondary | ICD-10-CM

## 2023-05-16 DIAGNOSIS — D649 Anemia, unspecified: Secondary | ICD-10-CM | POA: Diagnosis not present

## 2023-05-16 NOTE — Patient Instructions (Signed)
[facility] at [facility] Discharge Instructions   You were seen and examined today by Dr. Ellin Saba.  He reviewed the results of your lab work which are normal/stable.   Continue Erleada as prescribed.   You will be due for Eligard in January.   Return as scheduled.    Thank you for choosing [facility] at [facility] to provide your oncology and hematology care.  To afford each patient quality time with our provider, please arrive at least 15 minutes before your scheduled appointment time.   If you have a lab appointment with the Cancer Center please come in thru the Main Entrance and check in at the main information desk.  You need to re-schedule your appointment should you arrive 10 or more minutes late.  We strive to give you quality time with our providers, and arriving late affects you and other patients whose appointments are after yours.  Also, if you no show three or more times for appointments you may be dismissed from the clinic at the providers discretion.     Again, thank you for choosing [facility].  Our hope is that these requests will decrease the amount of time that you wait before being seen by our physicians.       _____________________________________________________________  Should you have questions after your visit to [facility], please contact our office at [phone] and follow the prompts.  Our office hours are 8:00 a.m. and 4:30 p.m. Monday - Friday.  Please note that voicemails left after 4:00 p.m. may not be returned until the following business day.  We are closed weekends and major holidays.  You do have access to a nurse 24-7, just call the main number to the clinic [phone] and do not press any options, hold on the line and a nurse will answer the phone.    For prescription refill requests, have your pharmacy contact our office and allow 72 hours.    Due to Covid, you will  need to wear a mask upon entering the hospital. If you do not have a mask, a mask will be given to you at the Main Entrance upon arrival. For doctor visits, patients may have 1 support person age 39 or older with them. For treatment visits, patients can not have anyone with them due to social distancing guidelines and our immunocompromised population.

## 2023-05-16 NOTE — Progress Notes (Signed)
[facility] [address], [place] [address]    Clinic Day:  05/16/2023  Referring physician: Fleet Contras, MD  Patient Care Team: Fleet Contras, MD as PCP - General (Internal Medicine) Marinus Maw, MD as PCP - Cardiology (Cardiology) Marinus Maw, MD (Cardiology) Early, [name], MD (Inactive) as Attending Physician (Vascular Surgery) [name], MD as Consulting Physician (Endocrinology) [name], MD (Inactive) as Consulting Physician (Cardiology) [name], [facility] (Inactive) as Pharmacist (Pharmacist) [name], RN as Oncology Nurse Navigator (Oncology) [name], MD as Medical Oncologist (Medical Oncology)   ASSESSMENT & PLAN:   Assessment: 1.  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 -Status post IMRT, 40 sessions completed on 06/14/2016 by Dr. Kathrynn Running. -PSA 0.7 (10/15/2018), 0.8 (02/18/2019), 1.4 (December 2020), 1.4 (June 2021) -Prostate biopsy on 02/10/2020-1 out of 12 cores from left mid lateral region positive for adenocarcinoma. -CTAP on 03/09/2020 with newly enlarged left iliac lymph nodes measuring 1.1 x 0.9 cm.  Hepatic steatosis with early signs of cirrhosis. -F-18 PSMA PET scan on 04/12/2020 showed radiotracer activity associated with enlarged left external iliac lymph node 9 mm with SUV 7.6.  Small left common iliac lymph node 6 mm, SUV 8.6.  Left periaortic lymph node 5 mm, SUV 6.3.  Lymph node between IVC and aorta at the level of right renal hilum, SUV 8.6.  Lymph node deep to the IVC measuring 6 mm, SUV 5.4.  No skeletal metastasis. 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 - Firmagon started on 04/21/2020 - Apalutamide 240 milligrams daily started around 04/22/2020.   2.  Social/family history: -He is a retired Product manager.  He lives at home with his wife.  He plays golf.  He quit smoking in 2015, smoked 1 pack/day for more than 20 years. -Sister has CML.  Maternal grandmother had breast cancer.  Maternal uncle had lung cancer.  2 maternal cousins had tongue cancer and uterine cancer.   3.  Cirrhosis: -Prior imaging showed cirrhosis of the liver with normal spleen.    Plan: 1.  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  Chronic headaches have been stable. - Last Eligard 45 mg on 12/21/2022. - Reviewed labs from 05/08/2023: Normal LFTs.  CBC grossly normal.  PSA 0.01. - He will receive Eligard 45 mg in January.  Continue apalutamide 240 mg daily.  RTC 3 months for follow-up with repeat PSA and testosterone levels.   2.  Normocytic anemia: - He received Feraheme on 12/15/2022.  Hemoglobin today is 13.1.  Last ferritin is 153 and percent saturation 25.  Will plan on repeating ferritin and iron panel at next visit.   3.  Mild thrombocytopenia: - Mild thrombocytopenia on and off since December 2020 has been stable.  Current platelet count 131.   4.  Osteopenia (DEXA 11/15/2021 T score -1.6): - Continue vitamin D 50,000 units every other week.  Vitamin D level today is 51, down from 122. - Because of osteopenia, I have recommended Prolia every 6 months.  He is still having dental work done for extractions. - Will start him after dental extractions done.    No orders of the defined types were placed in this encounter.      [name], MD   12/11/20243:01 PM  CHIEF COMPLAINT:  Diagnosis: metastatic castration sensitive prostate cancer    Cancer Staging  No matching staging information was found for the patient.    Prior Therapy: IMRT x 40 sessions completed on 06/14/2016   Current Therapy:  Firmagon every month; Erleada 240 mg daily    HISTORY OF PRESENT ILLNESS:   Oncology History   Prostate cancer (HCC)  03/10/2016 Initial Diagnosis   Prostate cancer (HCC)   05/23/2020 Genetic Testing   Negative genetic testing:  No pathogenic variants detected on the [facility] Common Hereditary Cancers Panel + Prostate Cancer HRR Panel. A variant of uncertain significance (VUS) was detected in the NF1 gene called c.1178A>G. The report date is 05/23/2020.  The Common Hereditary Cancers Panel offered by [facility] includes sequencing and/or deletion duplication testing of the following 47 genes: APC, ATM, AXIN2, BARD1, BMPR1A, BRCA1, BRCA2, BRIP1, CDH1, CDK4, CDKN2A (p14ARF), CDKN2A (p16INK4a), CHEK2, CTNNA1, DICER1, EPCAM (Deletion/duplication testing only), GREM1 (promoter region deletion/duplication testing only), KIT, MEN1, MLH1, MSH2, MSH3, MSH6, MUTYH, NBN, NF1, NTHL1, PALB2, PDGFRA, PMS2, POLD1, POLE, PTEN, RAD50, RAD51C, RAD51D, SDHB, SDHC, SDHD, SMAD4, SMARCA4. STK11, TP53, TSC1, TSC2, and VHL.  The following genes were evaluated for sequence changes only: SDHA and HOXB13 c.251G>A variant only. The Prostate Cancer HRR Panel offered by [facility] includes sequencing and/or deletion duplication analysis of the following 10 genes: ATM, BARD1, BRCA1, BRCA2, BRIP1, CHEK2, FANCL, PALB2, RAD51C, RAD51D.      INTERVAL HISTORY:   Peter Dunlap is a 67 y.o. male seen for follow-up of metastatic castrate sensitive prostate cancer.  Reports appetite 100% and energy levels of 75%.  PAST MEDICAL HISTORY:   Past Medical History: Past Medical History:  Diagnosis Date   Arteriosclerotic cardiovascular disease (ASCVD) 2005   catheterization in 10/2010:50% mid LAD, diffuse distal disease, circumflex irregularities, large dominant RCA with a 50% ostial, 70% distal, 60% posterolateral and 70% PDA; normal EF   Arthritis    Benign prostatic hypertrophy    Bilateral carpal tunnel syndrome 07/03/2018   Cerebrovascular disease 2010   R. carotid endarterectomy; Duplex in 10/2010-widely patent ICAs, subtotal left  vertebral-not thought to be contributing to symptoms   Cervical spine disease    CT in 2012-advanced degeneration and spondylosis with moderate spinal stenosis at C3-C6   CHF (congestive heart failure) (HCC)    Depression    Diabetes mellitus without complication (HCC)    Erectile dysfunction    Family history of breast cancer    Family history of cancer of mouth    Family history of CML (chronic monocytic leukemia)    Family history of lung cancer    Family history of ovarian cancer    Family history of stomach cancer    Gastroesophageal reflux disease    H/O hiatal hernia    H/O: substance abuse (HCC)    Cocaine, marijuana, alcohol.  Quit 2013.    Hyperlipidemia    Hypertension    Non-ST elevation myocardial infarction (NSTEMI), initial episode of care (HCC) 12/02/2013   DES LAD   Obesity    Prostate cancer (HCC)    Sleep apnea    CPAP   Tachy-brady syndrome (HCC)    a. 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 25, 2010    CEA   COLONOSCOPY WITH PROPOFOL N/A 06/24/2021   Procedure: COLONOSCOPY WITH PROPOFOL;  Surgeon: Jeani Hawking, MD;  Location: [facility] ENDOSCOPY;  Service: Endoscopy;  Laterality: N/A;   CORONARY ANGIOPLASTY WITH STENT PLACEMENT  12/03/2013   LAD 90%-->0% W/ Promus Premier DES 3.0 mm x 16 mm, CFX OK, RCA 40%, EF 70-75%   LEFT ATRIAL APPENDAGE OCCLUSION N/A 08/05/2015   Procedure: LEFT ATRIAL APPENDAGE OCCLUSION;  Surgeon: [name], MD;  Location: [facility] INVASIVE CV LAB;  Service: Cardiovascular;  Laterality: N/A;   LEFT HEART CATH AND CORONARY ANGIOGRAPHY N/A 09/13/2021    Procedure: LEFT HEART CATH AND CORONARY ANGIOGRAPHY;  Surgeon: Orbie Pyo, MD;  Location: [facility] INVASIVE CV LAB;  Service: Cardiovascular;  Laterality: N/A;   LEFT HEART CATHETERIZATION WITH CORONARY ANGIOGRAM Left 12/03/2013   Procedure: LEFT HEART CATHETERIZATION WITH CORONARY ANGIOGRAM;  Surgeon: Marykay Lex, MD;  Location: [facility] CATH LAB;  Service: Cardiovascular;  Laterality: Left;   LEFT HEART CATHETERIZATION WITH CORONARY ANGIOGRAM N/A 01/26/2014   Procedure: LEFT HEART CATHETERIZATION WITH CORONARY ANGIOGRAM;  Surgeon: Corky Crafts, MD;  Location: [facility] C[facility] LAB;  Service: Cardiovascular;  Laterality: N/A;   LEFT HEART CATHETERIZATION WITH CORONARY ANGIOGRAM N/A 08/03/2014   Procedure: LEFT HEART CATHETERIZATION WITH CORONARY ANGIOGRAM;  Surgeon: Kathleene Hazel, MD;  Location: [facility] CATH LAB;  Service: Cardiovascular;  Laterality: N/A;   PERCUTANEOUS CORONARY STENT INTERVENTION (PCI-S)  12/03/2013   Procedure: PERCUTANEOUS CORONARY STENT INTERVENTION (PCI-S);  Surgeon: Marykay Lex, MD;  Location: [facility] CATH LAB;  Service: Cardiovascular;;   PERMANENT PACEMAKER INSERTION N/A 09/18/2014   Procedure: PERMANENT PACEMAKER INSERTION;  Surgeon: Marinus Maw, MD;  Location[facility] CATH LAB;  Service: Cardiovascular;  Laterality: N/A;   POLYPECTOMY  06/24/2021   Procedure: POLYPECTOMY;  Surgeon: Jeani Hawking, MD;  Location: [facility] ENDOSCOPY;  Service: Endoscopy;;   RADIOFREQUENCY ABLATION  2005   for PSVT   TEE WITHOUT CARDIOVERSION N/A 07/27/2015   Procedure: TRANSESOPHAGEAL ECHOCARDIOGRAM (TEE);  Surgeon: Lewayne Bunting, MD;  [facility];  Service: Cardiovascular;  Laterality: N/A;   TEE WITHOUT CARDIOVERSION N/A 09/15/2015   Procedure: TRANSESOPHAGEAL ECHOCARDIOGRAM (TEE);  Surgeon: Vesta Mixer, MD;  Location: [facility] ENDOSCOPY;  Service: Cardiovascular;  Laterality: N/A;    Social History: Social History   Socioeconomic History   Marital status: Married    Spouse name: Not on  file   Number of children: 0   Years of education: Not on file   Highest education level: Not on file  Occupational History   Occupation: Retired  Tobacco Use   Smoking status: Former    Current packs/day: 0.00    Average packs/day: 1 pack/day for 40.0 years (40.0 ttl pk-yrs)    Types: Cigarettes    Start date: 10/20/1972    Quit date: 10/10/2012    Years since quitting: 10.6   Smokeless tobacco: Never   Tobacco comments:    Quit in May.   Vaping Use   Vaping status: Never Used  Substance and Sexual Activity   Alcohol use: Yes    Alcohol/week: 0.0 standard drinks of alcohol    Comment: former drinker-- sober since 2013.    Drug use: No    Types: Cocaine    Comment: quit cocaine 10/2011   Sexual activity: Yes    Partners:  Female  Other Topics Concern   Not on file  Social History Narrative   Lives in [place].   Social Determinants of Health   Financial Resource Strain: Low Risk  (04/19/2020)   Overall Financial Resource Strain (CARDIA)    Difficulty of Paying Living Expenses: Not hard at all  Food Insecurity: No Food Insecurity (04/19/2020)   Hunger Vital Sign    Worried About Running Out of Food in the Last Year: Never true    Ran Out of Food in the Last Year: Never true  Transportation Needs: No Transportation Needs (04/19/2020)   PRAPARE - Administrator, Civil Service (Medical): No    Lack of Transportation (Non-Medical): No  Physical Activity: Inactive (04/19/2020)   Exercise Vital Sign    Days of Exercise per Week: 0 days    Minutes of Exercise per Session: 0 min  Stress: No Stress Concern Present (04/19/2020)   [employer] of Occupational Health - Occupational Stress Questionnaire    Feeling of Stress : Not at all  Social Connections: Moderately Isolated (04/19/2020)   Social Connection and Isolation Panel [NHANES]    Frequency of Communication with Friends and Family: More than three times a week    Frequency of Social Gatherings with  Friends and Family: Once a week    Attends Religious Services: Never    Database administrator or Organizations: No    Attends Banker Meetings: Never    Marital Status: Married  Catering manager Violence: Not At Risk (04/19/2020)   Humiliation, Afraid, Rape, and Kick questionnaire    Fear of Current or Ex-Partner: No    Emotionally Abused: No    Physically Abused: No    Sexually Abused: No    Family History: Family History  Problem Relation Age of Onset   Hypertension Mother        Cerebrovascular disease   Diabetes Mother    Coronary artery disease Father 23   Diabetes type II Father    Hypertension Father    Heart attack Father    Diabetes Brother    Hypertension Brother    Diabetes Sister    Hypertension Sister    Heart attack Sister 13   Leukemia Sister 56       CML   Breast cancer Maternal Grandmother        dx 41s   Lung cancer Maternal Uncle        dx >50, smoker   Cancer Cousin        mouth cancer, dx 29s, no smoking/chew tobacco hx (maternal 1st cousin)   Ovarian cancer Cousin        dx <50 (maternal 1st cousin)   Stomach cancer Cousin        dx 50s (maternal 1st cousin)   Cancer Cousin        type unknown to pt, dx >50 (paternal 1st cousin)    Current Medications:  Current Outpatient Medications:    allopurinol (ZYLOPRIM) 100 MG tablet, TAKE 1 TABLET(100 MG) BY MOUTH DAILY, Disp: 90 tablet, Rfl: 0   apalutamide (ERLEADA) 60 MG tablet, TAKE 4 TABLETS (240 MG TOTAL) BY MOUTH DAILY. MAY BE TAKEN WITH OR WITHOUT FOOD. SWALLOW TABLETS WHOLE., Disp: 120 tablet, Rfl: 3   aspirin EC 325 MG tablet, Take 1 tablet (325 mg total) daily by mouth., Disp: 30 tablet, Rfl: 0   Blood Glucose Monitoring Suppl (BLOOD GLUCOSE SYSTEM PAK) KIT, Use as directed to monitor FSBS 1x daily.  Dx: E11.9., Disp: 1 kit, Rfl: 1   Cyanocobalamin (VITAMIN B 12) 500 MCG TABS, Take 500 mcg by mouth in the morning., Disp: , Rfl:    diclofenac Sodium (VOLTAREN) 1 % GEL, 2 g 2  (two) times daily., Disp: , Rfl:    FARXIGA 5 MG TABS tablet, Take 1 tablet (5 mg total) by mouth daily., Disp: 90 tablet, Rfl: 3   fluticasone (FLONASE) 50 MCG/ACT nasal spray, Place 2 sprays into both nostrils daily., Disp: 16 g, Rfl: 6   furosemide (LASIX) 20 MG tablet, TAKE 3 TABLETS BY MOUTH DAILY, Disp: 270 tablet, Rfl: 3   glucose blood (ONETOUCH ULTRA) test strip, Use as instructed to monitor glucose twice daily., Disp: 100 strip, Rfl: 3   isosorbide mononitrate (IMDUR) 30 MG 24 hr tablet, Take 1 tablet (30 mg total) by mouth daily., Disp: 90 tablet, Rfl: 3   Lactulose 20 GM/30ML SOLN, Take 30 mLs (20 g total) by mouth daily as needed. Take 30 ml by mouth every 3 hours until you have bowel movement then daily as needed, Disp: 450 mL, Rfl: 3   Lancets (ONETOUCH DELICA PLUS LANCET30G) MISC, USE EVERY DAY FOR GLUCOSE TESTING, Disp: , Rfl:    levothyroxine (SYNTHROID) 200 MCG tablet, Take 1 tablet (200 mcg total) by mouth daily before breakfast., Disp: 90 tablet, Rfl: 3   levothyroxine (SYNTHROID) 25 MCG tablet, Take 1 tablet (25 mcg total) by mouth See admin instructions. Takes along with 200 mcg to total 225 mcg daily, Disp: 90 tablet, Rfl: 3   loratadine (CLARITIN) 10 MG tablet, TAKE 1 TABLET(10 MG) BY MOUTH DAILY AS NEEDED FOR ALLERGIES, Disp: 30 tablet, Rfl: 2   magnesium oxide (MAG-OX) 400 (240 Mg) MG tablet, TAKE 1 TABLET(400 MG) BY MOUTH TWICE DAILY, Disp: 180 tablet, Rfl: 2   metFORMIN (GLUCOPHAGE) 500 MG tablet, Take 1 tablet (500 mg total) by mouth daily with breakfast., Disp: 90 tablet, Rfl: 3   metroNIDAZOLE (FLAGYL) 500 MG tablet, Take 4 tablets at once, Disp: 4 tablet, Rfl: 0   naloxone (NARCAN) nasal spray 4 mg/0.1 mL, Place 4 mg into the nose once as needed (accidental overdose)., Disp: , Rfl:    nitroGLYCERIN (NITROSTAT) 0.4 MG SL tablet, PLACE 1 TABLET UNDER THE TONGUE EVERY 5 MINUTES AS NEEDED FOR CHEST PAIN. CALL 911 AT THIRD DOSE IN 15 MINUTES, Disp: 25 tablet, Rfl: 11    oxyCODONE-acetaminophen (PERCOCET) 10-325 MG tablet, Take 1 tablet by mouth every 8 (eight) hours as needed for pain., Disp: 90 tablet, Rfl: 0   pantoprazole (PROTONIX) 40 MG tablet, TAKE 1 TABLET(40 MG) BY MOUTH DAILY, Disp: 30 tablet, Rfl: 3   potassium chloride SA (KLOR-CON M) 20 MEQ tablet, Take 1 tablet (20 mEq total) by mouth daily., Disp: 30 tablet, Rfl: 3   rosuvastatin (CRESTOR) 40 MG tablet, Take 40 mg by mouth every evening., Disp: , Rfl:    silodosin (RAPAFLO) 8 MG CAPS capsule, TAKE 1 CAPSULE BY MOUTH TWICE DAILY, Disp: 180 capsule, Rfl: 3   solifenacin (VESICARE) 10 MG tablet, TAKE 1 TABLET(10 MG) BY MOUTH DAILY, Disp: 30 tablet, Rfl: 11   sotalol (BETAPACE) 80 MG tablet, Take 1 tablet (80 mg total) by mouth 2 (two) times daily., Disp: 180 tablet, Rfl: 3   Vibegron (GEMTESA) 75 MG TABS, Take 1 tablet by mouth daily., Disp: , Rfl:    Vitamin D, Ergocalciferol, (DRISDOL) 1.25 MG (50000 UNIT) CAPS capsule, TAKE 1 CAPSULE BY MOUTH EVERY 7 DAYS, Disp: 12 capsule, Rfl:  0   Allergies: Allergies  Allergen Reactions   Trazodone And Nefazodone     Nightmares   Lactose Intolerance (Gi) Other (See Comments)    UPSET STOMACH     REVIEW OF SYSTEMS:   Review of Systems  Constitutional:  Negative for chills, fatigue and fever.  HENT:   Negative for lump/mass, mouth sores, nosebleeds, sore throat and trouble swallowing.   Eyes:  Negative for eye problems.  Respiratory:  Negative for cough and shortness of breath.   Cardiovascular:  Negative for chest pain, leg swelling and palpitations.  Gastrointestinal:  Negative for abdominal pain, diarrhea, nausea and vomiting.  Genitourinary:  Negative for bladder incontinence, difficulty urinating, dysuria, frequency, hematuria and nocturia.   Musculoskeletal:  Positive for back pain (9/10 severity). Negative for arthralgias, flank pain, myalgias and neck pain.  Skin:  Negative for itching and rash.  Neurological:  Positive for headaches and  numbness. Negative for dizziness.  Hematological:  Does not bruise/bleed easily.  Psychiatric/Behavioral:  Negative for depression, sleep disturbance and suicidal ideas. The patient is not nervous/anxious.   All other systems reviewed and are negative.    VITALS:   Blood pressure (!) 93/57, pulse 69, temperature (!) 96.6 F (35.9 C), temperature source Tympanic, resp. rate 18, weight 292 lb (132.5 kg), SpO2 99%.  Wt Readings from Last 3 Encounters:  05/16/23 292 lb (132.5 kg)  02/22/23 284 lb 9.6 oz (129.1 kg)  02/15/23 284 lb (128.8 kg)    Body mass index is 39.6 kg/m.  Performance status (ECOG): 1 - Symptomatic but completely ambulatory  PHYSICAL EXAM:   Physical Exam Vitals and nursing note reviewed. Exam conducted with a chaperone present.  Constitutional:      Appearance: Normal appearance.  Cardiovascular:     Rate and Rhythm: Normal rate and regular rhythm.     Pulses: Normal pulses.     Heart sounds: Normal heart sounds.  Pulmonary:     Effort: Pulmonary effort is normal.     Breath sounds: Normal breath sounds.  Abdominal:     Palpations: Abdomen is soft. There is no hepatomegaly, splenomegaly or mass.     Tenderness: There is no abdominal tenderness.  Musculoskeletal:     Right lower leg: No edema.     Left lower leg: No edema.  Lymphadenopathy:     Cervical: No cervical adenopathy.     Right cervical: No superficial, deep or posterior cervical adenopathy.    Left cervical: No superficial, deep or posterior cervical adenopathy.     Upper Body:     Right upper body: No supraclavicular or axillary adenopathy.     Left upper body: No supraclavicular or axillary adenopathy.  Neurological:     General: No focal deficit present.     Mental Status: He is alert and oriented to person, place, and time.  Psychiatric:        Mood and Affect: Mood normal.        Behavior: Behavior normal.     LABS:      Latest Ref Rng & Units 05/08/2023    1:57 PM 02/12/2023     2:57 PM 11/20/2022    2:05 PM  CBC  WBC 4.0 - 10.5 K/uL 6.0  6.9  5.7   Hemoglobin 13.0 - 17.0 g/dL 40.9  81.1  91.4   Hematocrit 39.0 - 52.0 % 39.4  40.0  38.1   Platelets 150 - 400 K/uL 131  119  110  Latest Ref Rng & Units 05/08/2023    1:57 PM 02/12/2023    2:57 PM 12/18/2022    9:38 AM  CMP  Glucose 70 - 99 mg/dL 829  98  562   BUN 8 - 23 mg/dL 10  17  14    Creatinine 0.61 - 1.24 mg/dL 1.30  8.65  7.84   Sodium 135 - 145 mmol/L 139  139  142   Potassium 3.5 - 5.1 mmol/L 3.9  3.6  3.2   Chloride 98 - 111 mmol/L 108  109  108   CO2 22 - 32 mmol/L 19  21  22    Calcium 8.9 - 10.3 mg/dL 9.2  9.2  9.4   Total Protein 6.5 - 8.1 g/dL 7.2  8.0    Total Bilirubin <1.2 mg/dL 0.7  0.9    Alkaline Phos 38 - 126 U/L 108  75    AST 15 - 41 U/L 20  20    ALT 0 - 44 U/L 11  10       No results found for: "CEA1", "CEA" / No results found for: "CEA1", "CEA" No results found for: "PSA1" No results found for: "ONG295" No results found for: "CAN125"  No results found for: "TOTALPROTELP", "ALBUMINELP", "A1GS", "A2GS", "BETS", "BETA2SER", "GAMS", "MSPIKE", "SPEI" Lab Results  Component Value Date   TIBC 348 02/12/2023   TIBC 407 11/20/2022   TIBC 451 (H) 08/21/2022   FERRITIN 153 02/12/2023   FERRITIN 44 11/20/2022   FERRITIN 25 08/21/2022   IRONPCTSAT 25 02/12/2023   IRONPCTSAT 15 (L) 11/20/2022   IRONPCTSAT 14 (L) 08/21/2022   No results found for: "LDH"   STUDIES:   CUP PACEART REMOTE DEVICE CHECK  Result Date: 05/09/2023 Scheduled remote reviewed. Normal device function.  3 AMS for AF, longest duration 24sec, pt has a watchman per EPIC Next remote 91 days. [place], [facility]

## 2023-05-16 NOTE — Telephone Encounter (Signed)
Pt came by checking on diabetic shoes form

## 2023-05-16 NOTE — Telephone Encounter (Signed)
This was faxed on 11/21 and again today. Pt made aware

## 2023-05-16 NOTE — Progress Notes (Signed)
Patient is taking Erleada as prescribed.  He has not missed any doses and reports no side effects at this time.   

## 2023-05-17 ENCOUNTER — Other Ambulatory Visit (HOSPITAL_COMMUNITY): Payer: Self-pay

## 2023-05-17 ENCOUNTER — Other Ambulatory Visit (HOSPITAL_COMMUNITY): Payer: Self-pay | Admitting: Pharmacy Technician

## 2023-05-17 NOTE — Progress Notes (Signed)
Specialty Pharmacy Refill Coordination Note  Peter Dunlap is a 67 y.o. male contacted today regarding refills of specialty medication(s) Apalutamide Lavone Neri)  Spoke with patient & wife  Patient requested Delivery   Delivery date: 05/28/23   Verified address: [name] [place], [place]   Medication will be filled on 05/25/23.

## 2023-05-25 ENCOUNTER — Other Ambulatory Visit: Payer: Self-pay

## 2023-05-25 ENCOUNTER — Other Ambulatory Visit: Payer: Self-pay | Admitting: Urology

## 2023-05-25 DIAGNOSIS — R350 Frequency of micturition: Secondary | ICD-10-CM

## 2023-06-06 ENCOUNTER — Emergency Department (HOSPITAL_COMMUNITY)
Admission: EM | Admit: 2023-06-06 | Discharge: 2023-06-06 | Disposition: A | Discharge status: Home / Self Care | Payer: 59 | Attending: Emergency Medicine | Admitting: Emergency Medicine

## 2023-06-06 ENCOUNTER — Other Ambulatory Visit: Payer: Self-pay

## 2023-06-06 ENCOUNTER — Encounter (HOSPITAL_COMMUNITY): Payer: Self-pay | Admitting: Hematology

## 2023-06-06 ENCOUNTER — Encounter (HOSPITAL_COMMUNITY): Payer: Self-pay

## 2023-06-06 DIAGNOSIS — I2510 Atherosclerotic heart disease of native coronary artery without angina pectoris: Secondary | ICD-10-CM | POA: Insufficient documentation

## 2023-06-06 DIAGNOSIS — I509 Heart failure, unspecified: Secondary | ICD-10-CM | POA: Diagnosis not present

## 2023-06-06 DIAGNOSIS — Z950 Presence of cardiac pacemaker: Secondary | ICD-10-CM | POA: Insufficient documentation

## 2023-06-06 DIAGNOSIS — Z7982 Long term (current) use of aspirin: Secondary | ICD-10-CM | POA: Insufficient documentation

## 2023-06-06 DIAGNOSIS — Z8546 Personal history of malignant neoplasm of prostate: Secondary | ICD-10-CM | POA: Insufficient documentation

## 2023-06-06 DIAGNOSIS — E119 Type 2 diabetes mellitus without complications: Secondary | ICD-10-CM | POA: Insufficient documentation

## 2023-06-06 DIAGNOSIS — R310 Gross hematuria: Secondary | ICD-10-CM | POA: Insufficient documentation

## 2023-06-06 DIAGNOSIS — R102 Pelvic and perineal pain: Secondary | ICD-10-CM | POA: Insufficient documentation

## 2023-06-06 DIAGNOSIS — R339 Retention of urine, unspecified: Secondary | ICD-10-CM

## 2023-06-06 DIAGNOSIS — I4891 Unspecified atrial fibrillation: Secondary | ICD-10-CM | POA: Diagnosis not present

## 2023-06-06 DIAGNOSIS — Z794 Long term (current) use of insulin: Secondary | ICD-10-CM | POA: Diagnosis not present

## 2023-06-06 DIAGNOSIS — Z7984 Long term (current) use of oral hypoglycemic drugs: Secondary | ICD-10-CM | POA: Diagnosis not present

## 2023-06-06 DIAGNOSIS — R338 Other retention of urine: Secondary | ICD-10-CM | POA: Diagnosis present

## 2023-06-06 LAB — URINALYSIS, ROUTINE W REFLEX MICROSCOPIC
Bilirubin Urine: NEGATIVE
Glucose, UA: >=500 mg/dL — AB
Ketones, ur: NEGATIVE mg/dL
Leukocytes,Ua: NEGATIVE
Nitrite: NEGATIVE
Protein, ur: 100 mg/dL — AB
RBC / HPF: >50 RBC/hpf (ref 0–5)
Specific Gravity, Urine: 1.015 (ref 1.005–1.030)
pH: 6 (ref 5.0–8.0)

## 2023-06-06 LAB — CBC WITH DIFFERENTIAL/PLATELET
Abs Immature Granulocytes: 0.02 10*3/uL (ref 0.00–0.07)
Basophils Absolute: 0.1 10*3/uL (ref 0.0–0.1)
Basophils Relative: 1 %
Eosinophils Absolute: 0.2 10*3/uL (ref 0.0–0.5)
Eosinophils Relative: 3 %
HCT: 38.2 % — ABNORMAL LOW (ref 39.0–52.0)
Hemoglobin: 13 g/dL (ref 13.0–17.0)
Immature Granulocytes: 0 %
Lymphocytes Relative: 29 %
Lymphs Abs: 1.8 10*3/uL (ref 0.7–4.0)
MCH: 32.9 pg (ref 26.0–34.0)
MCHC: 34 g/dL (ref 30.0–36.0)
MCV: 96.7 fL (ref 80.0–100.0)
Monocytes Absolute: 0.7 10*3/uL (ref 0.1–1.0)
Monocytes Relative: 11 %
Neutro Abs: 3.3 10*3/uL (ref 1.7–7.7)
Neutrophils Relative %: 56 %
Platelets: 116 10*3/uL — ABNORMAL LOW (ref 150–400)
RBC: 3.95 MIL/uL — ABNORMAL LOW (ref 4.22–5.81)
RDW: 13.6 % (ref 11.5–15.5)
WBC: 6 10*3/uL (ref 4.0–10.5)
nRBC: 0 % (ref 0.0–0.2)

## 2023-06-06 LAB — BASIC METABOLIC PANEL
Anion gap: 8 (ref 5–15)
BUN: 14 mg/dL (ref 8–23)
CO2: 20 mmol/L — ABNORMAL LOW (ref 22–32)
Calcium: 9 mg/dL (ref 8.9–10.3)
Chloride: 108 mmol/L (ref 98–111)
Creatinine, Ser: 1.21 mg/dL (ref 0.61–1.24)
GFR, Estimated: >60 mL/min (ref >60)
Glucose, Bld: 141 mg/dL — ABNORMAL HIGH (ref 70–99)
Potassium: 3.5 mmol/L (ref 3.5–5.1)
Sodium: 136 mmol/L (ref 135–145)

## 2023-06-06 MED ORDER — SODIUM CHLORIDE 0.9 % IR SOLN: 3000.0000 mL | Status: DC

## 2023-06-06 MED ORDER — OXYCODONE-ACETAMINOPHEN 5-325 MG PO TABS
1.0000 | ORAL_TABLET | Freq: Once | ORAL | Status: AC
  Administered 2023-06-06: 1 via ORAL
  Filled 2023-06-06: qty 1

## 2023-06-06 NOTE — ED Notes (Signed)
 Pt discharged home with foley catheter in place. Provided leg bag and instructed on foley care and need to follow-up with urologist tomorrow. Verbalized understanding.

## 2023-06-06 NOTE — ED Notes (Signed)
 Urinary output is back to looking like cranberry juice. PA notified.

## 2023-06-06 NOTE — ED Notes (Signed)
 Foley irrigated with 500 cc saline and return of only slightly pink urine. Explained procedure to the patient. Will now monitor the urine output for a while to see if there is any additional frank blood in his urine.

## 2023-06-06 NOTE — ED Triage Notes (Signed)
 Pt reports he is having chemo for prostate cancer and he has a lot of blood in his urine that started today.

## 2023-06-06 NOTE — ED Provider Notes (Signed)
  Physical Exam  BP 125/79   Pulse 61   Temp 98.8 F (37.1 C) (Oral)   Resp 16   Ht 6' (1.829 m)   Wt 132.5 kg   SpO2 99%   BMI 39.60 kg/m   Physical Exam  Procedures  Procedures  ED Course / MDM    Medical Decision Making Amount and/or Complexity of Data Reviewed Labs: ordered.  Risk Prescription drug management.  Signout taken from [employer], PA-C.  Patient had urinary retention today and grossly bloody urine.  Irrigating his bladder at this time.  And for follow-up, if he does not have continued bleeding he can be transition to leg bag and discharged to follow-up with urology.  Was reevaluated by myself, sitting comfortably bed, catheter was irrigated and now having bloody urine with no clots.  Discussed need for follow-up with urology, will send him home with a leg bag, advised on follow-up and return precautions.       Suellen Sherran LABOR, PA-C 06/06/23 2232    Pamella Ozell LABOR, DO 06/13/23 1350

## 2023-06-06 NOTE — ED Provider Notes (Signed)
 [place] EMERGENCY DEPARTMENT AT [facility] Provider Note   CSN: [id number] Arrival date & time: 06/06/23  1609     History  Chief Complaint  Patient presents with   Hematuria    Peter Dunlap is a 68 y.o. male.   Hematuria Pertinent negatives include no chest pain, no abdominal pain, no headaches and no shortness of breath.       Peter Dunlap is a 68 y.o. male with past medical history of prostate cancer, currently undergoing chemotherapy, paroxysmal supraventricular tachycardia, atrial fibrillation, pacemaker CHF, coronary artery disease, type 2 diabetes who presents to the Emergency Department complaining of hematuria and urinary retention.  Describes having intermittent episodes of difficulty urinating x 1 week.  Approximately 1-1/2 to 2 hours ago, felt urge to urinate and passed a gush of blood.  Noticed having passage of a large blood clot as well.  He has had feelings to urinate since then but has been unable to do so.  Some lower abdominal discomfort, no fever or chills.  No nausea or vomiting or generalized weakness.  Home Medications Prior to Admission medications   Medication Sig Start Date End Date Taking? Authorizing Provider  allopurinol  (ZYLOPRIM ) 100 MG tablet TAKE 1 TABLET(100 MG) BY MOUTH DAILY 09/07/20   Duanne Butler DASEN, MD  apalutamide  (ERLEADA ) 60 MG tablet TAKE 4 TABLETS (240 MG TOTAL) BY MOUTH DAILY. MAY BE TAKEN WITH OR WITHOUT FOOD. SWALLOW TABLETS WHOLE. 02/15/23 02/15/24  Rogers Hai, MD  aspirin  EC 325 MG tablet Take 1 tablet (325 mg total) daily by mouth. 04/13/17   Lelon Hamilton T, PA-C  Blood Glucose Monitoring Suppl (BLOOD GLUCOSE SYSTEM PAK) KIT Use as directed to monitor FSBS 1x daily. Dx: E11.9. 03/04/19   Bari Theodoro FALCON, MD  Cyanocobalamin (VITAMIN B 12) 500 MCG TABS Take 500 mcg by mouth in the morning.    [provider]  diclofenac  Sodium (VOLTAREN ) 1 % GEL 2 g 2 (two) times daily. 08/18/21   [provider]  FARXIGA  5 MG TABS tablet Take 1 tablet (5 mg total) by mouth daily. 02/22/23   Therisa Benton PARAS, NP  fluticasone  (FLONASE ) 50 MCG/ACT nasal spray Place 2 sprays into both nostrils daily. 06/02/20   Chandra Harlene LABOR, NP  furosemide  (LASIX ) 20 MG tablet TAKE 3 TABLETS BY MOUTH DAILY 06/24/19   Waddell Danelle ORN, MD  glucose blood (ONETOUCH ULTRA) test strip Use as instructed to monitor glucose twice daily. 10/10/21   Therisa Benton PARAS, NP  isosorbide  mononitrate (IMDUR ) 30 MG 24 hr tablet Take 1 tablet (30 mg total) by mouth daily. 07/27/22   Lelon Hamilton T, PA-C  Lactulose  20 GM/30ML SOLN Take 30 mLs (20 g total) by mouth daily as needed. Take 30 ml by mouth every 3 hours until you have bowel movement then daily as needed 09/20/21   Rogers Hai, MD  Lancets [facility] DELICA PLUS LANCET30G) MISC USE EVERY DAY FOR GLUCOSE TESTING 05/10/22   [provider]  levothyroxine  (SYNTHROID ) 200 MCG tablet Take 1 tablet (200 mcg total) by mouth daily before breakfast. 02/22/23   Therisa Benton PARAS, NP  levothyroxine  (SYNTHROID ) 25 MCG tablet Take 1 tablet (25 mcg total) by mouth See admin instructions. Takes along with 200 mcg to total 225 mcg daily 02/22/23   Therisa Benton PARAS, NP  loratadine  (CLARITIN ) 10 MG tablet TAKE 1 TABLET(10 MG) BY MOUTH DAILY AS NEEDED FOR ALLERGIES 04/19/20   Bari Theodoro FALCON, MD  magnesium  oxide (MAG-OX)  400 (240 Mg) MG tablet TAKE 1 TABLET(400 MG) BY MOUTH TWICE DAILY 10/19/21   Waddell Danelle ORN, MD  metFORMIN  (GLUCOPHAGE ) 500 MG tablet Take 1 tablet (500 mg total) by mouth daily with breakfast. 02/22/23   Therisa Benton PARAS, NP  metroNIDAZOLE  (FLAGYL ) 500 MG tablet Take 4 tablets at once 01/10/23   Dahlstedt, Stephen, MD  naloxone [facility]) nasal spray 4 mg/0.1 mL Place 4 mg into the nose once as needed (accidental overdose). 01/28/21   [provider]  nitroGLYCERIN  (NITROSTAT ) 0.4 MG SL tablet PLACE 1 TABLET UNDER THE TONGUE EVERY 5 MINUTES AS  NEEDED FOR CHEST PAIN. CALL 911 AT THIRD DOSE IN 15 MINUTES 09/07/22   Leverne Charlies Helling, PA-C  oxyCODONE -acetaminophen  (PERCOCET) 10-325 MG tablet Take 1 tablet by mouth every 8 (eight) hours as needed for pain. 08/13/20   Bari Theodoro FALCON, MD  pantoprazole  (PROTONIX ) 40 MG tablet TAKE 1 TABLET(40 MG) BY MOUTH DAILY 07/19/20   Manlius, Kawanta F, MD  potassium chloride  SA (KLOR-CON  M) 20 MEQ tablet Take 1 tablet (20 mEq total) by mouth daily. 09/29/21   Acharya, Gayatri A, MD  rosuvastatin  (CRESTOR ) 40 MG tablet Take 40 mg by mouth every evening. 04/02/21   [provider]  silodosin  (RAPAFLO ) 8 MG CAPS capsule TAKE 1 CAPSULE BY MOUTH TWICE DAILY 04/26/23   Matilda Senior, MD  solifenacin  (VESICARE ) 10 MG tablet TAKE 1 TABLET(10 MG) BY MOUTH DAILY 05/25/23   Matilda Senior, MD  sotalol  (BETAPACE ) 80 MG tablet Take 1 tablet (80 mg total) by mouth 2 (two) times daily. 11/13/22   Waddell Danelle ORN, MD  Vibegron (GEMTESA) 75 MG TABS Take 1 tablet by mouth daily.    [provider]  Vitamin D , Ergocalciferol , (DRISDOL ) 1.25 MG (50000 UNIT) CAPS capsule TAKE 1 CAPSULE BY MOUTH EVERY 7 DAYS 06/25/20   Bari Theodoro FALCON, MD      Allergies    Trazodone  and nefazodone and Lactose intolerance (gi)    Review of Systems   Review of Systems  Constitutional:  Negative for appetite change, chills and fever.  Respiratory:  Negative for shortness of breath.   Cardiovascular:  Negative for chest pain.  Gastrointestinal:  Negative for abdominal pain, nausea and vomiting.  Genitourinary:  Positive for difficulty urinating and hematuria.  Neurological:  Negative for dizziness and headaches.    Physical Exam Updated Vital Signs BP (!) 104/58 (BP Location: Right Arm)   Pulse 64   Temp 98.8 F (37.1 C) (Oral)   Resp 16   Ht 6' (1.829 m)   Wt 132.5 kg   SpO2 96%   BMI 39.60 kg/m  Physical Exam Vitals and nursing note reviewed.  Constitutional:      General: He is not in acute  distress.    Appearance: Normal appearance. He is not ill-appearing or toxic-appearing.  Cardiovascular:     Rate and Rhythm: Normal rate and regular rhythm.     Pulses: Normal pulses.  Pulmonary:     Effort: Pulmonary effort is normal.  Abdominal:     Tenderness: There is abdominal tenderness.     Comments: Mild suprapubic tenderness on exam.  No guarding or rebound.  Abdomen is soft  Musculoskeletal:     Right lower leg: No edema.     Left lower leg: No edema.  Skin:    General: Skin is warm.     Capillary Refill: Capillary refill takes less than 2 seconds.  Neurological:     General: No focal  deficit present.     Mental Status: He is alert.     Sensory: No sensory deficit.     Motor: No weakness.     ED Results / Procedures / Treatments   Labs (all labs ordered are listed, but only abnormal results are displayed) Labs Reviewed  CBC WITH DIFFERENTIAL/PLATELET - Abnormal; Notable for the following components:      Result Value   RBC 3.95 (*)    HCT 38.2 (*)    Platelets 116 (*)    All other components within normal limits  BASIC METABOLIC PANEL - Abnormal; Notable for the following components:   CO2 20 (*)    Glucose, Bld 141 (*)    All other components within normal limits  URINALYSIS, ROUTINE W REFLEX MICROSCOPIC - Abnormal; Notable for the following components:   APPearance HAZY (*)    Glucose, UA >=500 (*)    Hgb urine dipstick LARGE (*)    Protein, ur 100 (*)    Bacteria, UA FEW (*)    All other components within normal limits    EKG None  Radiology No results found.  Procedures Procedures    Medications Ordered in ED Medications - No data to display  ED Course/ Medical Decision Making/ A&P                                 Medical Decision Making Patient with history of prostate cancer currently undergoing chemotherapy.  Here for acute urinary retention.  Had 1 episode of passage of gross blood with blood clot when he attempted to urinate.  He has  some pain pressure of the suprapubic region.  Otherwise abdomen is soft.  I suspect acute urinary retention, AKI, infection also considered.  Will likely need Foley catheter.  Bladder scan also ordered will check labs  Amount and/or Complexity of Data Reviewed Labs: ordered.    Details: Labs without evidence of leukocytosis, hemoglobin reassuring.You are now also shows hazy urine with largeOn a bloodWithout leukocytes or nitrites.Chemistries without significant derangement. Discussion of management or test interpretation with external provider(s): Bladder scan showed 289 cc, Foley catheter was placed, draining pink-colored urine, no blood clots  On recheck,Patient appears to be resting comfortably. Complains of some discomfort of his lower back which he states is similar to previous.Has not had his evening dose of his Oxycodone . Urine Flowing well into Foley catheter bag.  Urine does appear to be bloodier than previously, will try bladder irrigation.  If improved after irrigation, pt can be d/c home with leg bag and close out patient f/u with urology  Discussed with Sherran Barks, PA-C who will recheck after irrigation   Risk Prescription drug management.           Final Clinical Impression(s) / ED Diagnoses Final diagnoses:  Acute urinary retention    Rx / DC Orders ED Discharge Orders     None         Herlinda Milling, PA-C 06/06/23 1928    Pamella Ozell LABOR, DO 06/13/23 1349

## 2023-06-06 NOTE — ED Notes (Signed)
 Urine is much bloodier than at the time of foley insertion. At insertion, the urine was just slightly pink. Now it looks more like cranberry juice. PA notified. New orders received.

## 2023-06-06 NOTE — Discharge Instructions (Addendum)
 Keep the Foley catheter in place.  Please call your your urologist tomorrow to arrange a follow-up appointment.  Return to the emergency department for any new or worsening symptoms.

## 2023-06-07 ENCOUNTER — Observation Stay (HOSPITAL_COMMUNITY)
Admission: EM | Admit: 2023-06-07 | Discharge: 2023-06-10 | Disposition: A | Discharge status: Home / Self Care | Payer: 59 | Attending: Internal Medicine | Admitting: Internal Medicine

## 2023-06-07 ENCOUNTER — Encounter (HOSPITAL_COMMUNITY): Payer: Self-pay

## 2023-06-07 DIAGNOSIS — E782 Mixed hyperlipidemia: Secondary | ICD-10-CM | POA: Diagnosis present

## 2023-06-07 DIAGNOSIS — E669 Obesity, unspecified: Secondary | ICD-10-CM | POA: Diagnosis present

## 2023-06-07 DIAGNOSIS — E1140 Type 2 diabetes mellitus with diabetic neuropathy, unspecified: Secondary | ICD-10-CM | POA: Insufficient documentation

## 2023-06-07 DIAGNOSIS — N289 Disorder of kidney and ureter, unspecified: Secondary | ICD-10-CM | POA: Diagnosis not present

## 2023-06-07 DIAGNOSIS — I495 Sick sinus syndrome: Secondary | ICD-10-CM | POA: Diagnosis present

## 2023-06-07 DIAGNOSIS — Z8546 Personal history of malignant neoplasm of prostate: Secondary | ICD-10-CM

## 2023-06-07 DIAGNOSIS — Z7901 Long term (current) use of anticoagulants: Secondary | ICD-10-CM | POA: Insufficient documentation

## 2023-06-07 DIAGNOSIS — R310 Gross hematuria: Principal | ICD-10-CM | POA: Diagnosis present

## 2023-06-07 DIAGNOSIS — I480 Paroxysmal atrial fibrillation: Secondary | ICD-10-CM | POA: Diagnosis present

## 2023-06-07 DIAGNOSIS — D696 Thrombocytopenia, unspecified: Secondary | ICD-10-CM | POA: Diagnosis not present

## 2023-06-07 DIAGNOSIS — G4733 Obstructive sleep apnea (adult) (pediatric): Secondary | ICD-10-CM | POA: Diagnosis present

## 2023-06-07 DIAGNOSIS — Z79899 Other long term (current) drug therapy: Secondary | ICD-10-CM | POA: Insufficient documentation

## 2023-06-07 DIAGNOSIS — I10 Essential (primary) hypertension: Secondary | ICD-10-CM | POA: Diagnosis not present

## 2023-06-07 DIAGNOSIS — C61 Malignant neoplasm of prostate: Secondary | ICD-10-CM

## 2023-06-07 DIAGNOSIS — N179 Acute kidney failure, unspecified: Secondary | ICD-10-CM | POA: Insufficient documentation

## 2023-06-07 DIAGNOSIS — F17200 Nicotine dependence, unspecified, uncomplicated: Principal | ICD-10-CM

## 2023-06-07 DIAGNOSIS — I4891 Unspecified atrial fibrillation: Secondary | ICD-10-CM | POA: Diagnosis not present

## 2023-06-07 DIAGNOSIS — Z6830 Body mass index (BMI) 30.0-30.9, adult: Secondary | ICD-10-CM | POA: Insufficient documentation

## 2023-06-07 DIAGNOSIS — E890 Postprocedural hypothyroidism: Secondary | ICD-10-CM | POA: Diagnosis present

## 2023-06-07 DIAGNOSIS — E1149 Type 2 diabetes mellitus with other diabetic neurological complication: Secondary | ICD-10-CM | POA: Diagnosis present

## 2023-06-07 DIAGNOSIS — Z7984 Long term (current) use of oral hypoglycemic drugs: Secondary | ICD-10-CM | POA: Diagnosis not present

## 2023-06-07 DIAGNOSIS — N320 Bladder-neck obstruction: Secondary | ICD-10-CM | POA: Diagnosis not present

## 2023-06-07 DIAGNOSIS — Z87891 Personal history of nicotine dependence: Secondary | ICD-10-CM | POA: Insufficient documentation

## 2023-06-07 DIAGNOSIS — R319 Hematuria, unspecified: Secondary | ICD-10-CM | POA: Diagnosis present

## 2023-06-07 DIAGNOSIS — K219 Gastro-esophageal reflux disease without esophagitis: Secondary | ICD-10-CM | POA: Diagnosis present

## 2023-06-07 DIAGNOSIS — R339 Retention of urine, unspecified: Secondary | ICD-10-CM | POA: Insufficient documentation

## 2023-06-07 LAB — URINALYSIS, ROUTINE W REFLEX MICROSCOPIC

## 2023-06-07 LAB — CBC
HCT: 40.8 % (ref 39.0–52.0)
Hemoglobin: 13.5 g/dL (ref 13.0–17.0)
MCH: 32.2 pg (ref 26.0–34.0)
MCHC: 33.1 g/dL (ref 30.0–36.0)
MCV: 97.4 fL (ref 80.0–100.0)
Platelets: 137 10*3/uL — ABNORMAL LOW (ref 150–400)
RBC: 4.19 MIL/uL — ABNORMAL LOW (ref 4.22–5.81)
RDW: 13.7 % (ref 11.5–15.5)
WBC: 9.1 10*3/uL (ref 4.0–10.5)
nRBC: 0 % (ref 0.0–0.2)

## 2023-06-07 LAB — BASIC METABOLIC PANEL
Anion gap: 11 (ref 5–15)
BUN: 15 mg/dL (ref 8–23)
CO2: 19 mmol/L — ABNORMAL LOW (ref 22–32)
Calcium: 9.3 mg/dL (ref 8.9–10.3)
Chloride: 109 mmol/L (ref 98–111)
Creatinine, Ser: 1.39 mg/dL — ABNORMAL HIGH (ref 0.61–1.24)
GFR, Estimated: 56 mL/min — ABNORMAL LOW (ref >60)
Glucose, Bld: 159 mg/dL — ABNORMAL HIGH (ref 70–99)
Potassium: 4.2 mmol/L (ref 3.5–5.1)
Sodium: 139 mmol/L (ref 135–145)

## 2023-06-07 LAB — URINALYSIS, MICROSCOPIC (REFLEX)
Bacteria, UA: NONE SEEN
RBC / HPF: >50 RBC/hpf (ref 0–5)

## 2023-06-07 NOTE — ED Triage Notes (Addendum)
 Seen at [facility] yesterday for same complaints. Here today for hematuria. On chemo for prostate cancer. Foley was started yesterday. Not on blood thinners. Pt states foley catheter stopped draining since 1330 today.

## 2023-06-07 NOTE — ED Provider Triage Note (Signed)
 Emergency Medicine Provider Triage Evaluation Note  Peter Dunlap , a 68 y.o. male with history of prostate cancer was evaluated in triage.  Pt complains of hematuria and suprapubic pain.  Seen at [facility] yesterday for similar complaints.  Foley still in place.  Review of Systems  Positive:  Negative:   Physical Exam  BP (!) 105/58 (BP Location: Right Arm)   Pulse 76   Temp 98 F (36.7 C) (Oral)   Resp 16   Ht 6' (1.829 m)   Wt 130.6 kg   SpO2 97%   BMI 39.06 kg/m  Gen:   Awake, no distress   Resp:  Normal effort  MSK:   Moves extremities without difficulty  Other:  Foley catheter with hematuria  Medical Decision Making  Medically screening exam initiated at 7:59 PM.  Appropriate orders placed.  Tamarion L Mccarver was informed that the remainder of the evaluation will be completed by another provider, this initial triage assessment does not replace that evaluation, and the importance of remaining in the ED until their evaluation is complete.     Donnajean Lynwood DEL, PA-C 06/07/23 1959

## 2023-06-08 ENCOUNTER — Other Ambulatory Visit: Payer: Self-pay

## 2023-06-08 ENCOUNTER — Ambulatory Visit: Payer: 59 | Admitting: Urology

## 2023-06-08 ENCOUNTER — Telehealth: Payer: Self-pay | Admitting: Urology

## 2023-06-08 ENCOUNTER — Encounter (HOSPITAL_COMMUNITY): Payer: Self-pay | Admitting: Hematology

## 2023-06-08 DIAGNOSIS — R310 Gross hematuria: Principal | ICD-10-CM | POA: Diagnosis present

## 2023-06-08 DIAGNOSIS — Z8546 Personal history of malignant neoplasm of prostate: Secondary | ICD-10-CM

## 2023-06-08 DIAGNOSIS — R319 Hematuria, unspecified: Secondary | ICD-10-CM | POA: Insufficient documentation

## 2023-06-08 DIAGNOSIS — I495 Sick sinus syndrome: Secondary | ICD-10-CM

## 2023-06-08 DIAGNOSIS — E890 Postprocedural hypothyroidism: Secondary | ICD-10-CM

## 2023-06-08 DIAGNOSIS — K219 Gastro-esophageal reflux disease without esophagitis: Secondary | ICD-10-CM | POA: Diagnosis present

## 2023-06-08 DIAGNOSIS — I10 Essential (primary) hypertension: Secondary | ICD-10-CM | POA: Diagnosis not present

## 2023-06-08 DIAGNOSIS — E782 Mixed hyperlipidemia: Secondary | ICD-10-CM

## 2023-06-08 DIAGNOSIS — N320 Bladder-neck obstruction: Secondary | ICD-10-CM | POA: Diagnosis not present

## 2023-06-08 DIAGNOSIS — N289 Disorder of kidney and ureter, unspecified: Secondary | ICD-10-CM | POA: Diagnosis not present

## 2023-06-08 DIAGNOSIS — G4733 Obstructive sleep apnea (adult) (pediatric): Secondary | ICD-10-CM

## 2023-06-08 DIAGNOSIS — D696 Thrombocytopenia, unspecified: Secondary | ICD-10-CM | POA: Diagnosis present

## 2023-06-08 DIAGNOSIS — E669 Obesity, unspecified: Secondary | ICD-10-CM | POA: Diagnosis present

## 2023-06-08 LAB — HEMOGLOBIN AND HEMATOCRIT, BLOOD
HCT: 39.4 % (ref 39.0–52.0)
HCT: 38 % — ABNORMAL LOW (ref 39.0–52.0)
Hemoglobin: 13.2 g/dL (ref 13.0–17.0)
Hemoglobin: 12.9 g/dL — ABNORMAL LOW (ref 13.0–17.0)

## 2023-06-08 LAB — URINE CULTURE: Culture: NO GROWTH

## 2023-06-08 LAB — HIV ANTIBODY (ROUTINE TESTING W REFLEX): HIV Screen 4th Generation wRfx: NONREACTIVE

## 2023-06-08 MED ORDER — LEVOTHYROXINE SODIUM 25 MCG PO TABS
25.0000 ug | ORAL_TABLET | Freq: Every day | ORAL | Status: DC
  Administered 2023-06-09 – 2023-06-10 (×2): 25 ug via ORAL
  Filled 2023-06-08 (×2): qty 1

## 2023-06-08 MED ORDER — LEVOTHYROXINE SODIUM 100 MCG PO TABS
200.0000 ug | ORAL_TABLET | Freq: Every day | ORAL | Status: DC
  Administered 2023-06-09 – 2023-06-10 (×2): 200 ug via ORAL
  Filled 2023-06-08 (×2): qty 2

## 2023-06-08 MED ORDER — OXYCODONE-ACETAMINOPHEN 5-325 MG PO TABS
1.0000 | ORAL_TABLET | Freq: Three times a day (TID) | ORAL | Status: DC | PRN
  Administered 2023-06-08 – 2023-06-10 (×5): 1 via ORAL
  Filled 2023-06-08 (×5): qty 1

## 2023-06-08 MED ORDER — MIRABEGRON ER 25 MG PO TB24: 25.0000 mg | ORAL_TABLET | ORAL | Status: DC

## 2023-06-08 MED ORDER — ISOSORBIDE MONONITRATE ER 30 MG PO TB24
30.0000 mg | ORAL_TABLET | Freq: Every day | ORAL | Status: DC
  Administered 2023-06-08 – 2023-06-10 (×3): 30 mg via ORAL
  Filled 2023-06-08 (×3): qty 1

## 2023-06-08 MED ORDER — SOTALOL HCL 80 MG PO TABS
80.0000 mg | ORAL_TABLET | Freq: Two times a day (BID) | ORAL | Status: DC
  Administered 2023-06-08 – 2023-06-10 (×5): 80 mg via ORAL
  Filled 2023-06-08 (×6): qty 1

## 2023-06-08 MED ORDER — ALBUTEROL SULFATE (2.5 MG/3ML) 0.083% IN NEBU: 2.5000 mg | INHALATION_SOLUTION | Freq: Four times a day (QID) | RESPIRATORY_TRACT | Status: DC | PRN

## 2023-06-08 MED ORDER — DICLOFENAC SODIUM 1 % EX GEL: 2.0000 g | Freq: Two times a day (BID) | CUTANEOUS | Status: DC | PRN

## 2023-06-08 MED ORDER — ACETAMINOPHEN 325 MG PO TABS: 650.0000 mg | ORAL_TABLET | Freq: Four times a day (QID) | ORAL | Status: DC | PRN

## 2023-06-08 MED ORDER — TAMSULOSIN HCL 0.4 MG PO CAPS
0.4000 mg | ORAL_CAPSULE | Freq: Two times a day (BID) | ORAL | Status: DC
  Administered 2023-06-08 – 2023-06-10 (×4): 0.4 mg via ORAL
  Filled 2023-06-08 (×4): qty 1

## 2023-06-08 MED ORDER — FESOTERODINE FUMARATE ER 4 MG PO TB24
4.0000 mg | ORAL_TABLET | Freq: Every day | ORAL | Status: DC
  Administered 2023-06-09 – 2023-06-10 (×2): 4 mg via ORAL
  Filled 2023-06-08 (×2): qty 1

## 2023-06-08 MED ORDER — ALLOPURINOL 100 MG PO TABS
100.0000 mg | ORAL_TABLET | Freq: Every day | ORAL | Status: DC
Start: 2023-06-08 — End: 2023-06-10
  Administered 2023-06-08 – 2023-06-10 (×3): 100 mg via ORAL
  Filled 2023-06-08 (×3): qty 1

## 2023-06-08 MED ORDER — OXYCODONE-ACETAMINOPHEN 5-325 MG PO TABS
1.0000 | ORAL_TABLET | Freq: Once | ORAL | Status: AC
  Administered 2023-06-08: 1 via ORAL
  Filled 2023-06-08: qty 1

## 2023-06-08 MED ORDER — LORATADINE 10 MG PO TABS
10.0000 mg | ORAL_TABLET | Freq: Every day | ORAL | Status: DC
Start: 2023-06-08 — End: 2023-06-10
  Administered 2023-06-08 – 2023-06-10 (×3): 10 mg via ORAL
  Filled 2023-06-08 (×3): qty 1

## 2023-06-08 MED ORDER — CHLORHEXIDINE GLUCONATE CLOTH 2 % EX PADS
6.0000 | MEDICATED_PAD | Freq: Every day | CUTANEOUS | Status: DC
  Administered 2023-06-08 – 2023-06-10 (×3): 6 via TOPICAL

## 2023-06-08 MED ORDER — SODIUM CHLORIDE 0.9% FLUSH
3.0000 mL | Freq: Two times a day (BID) | INTRAVENOUS | Status: DC
  Administered 2023-06-08 – 2023-06-10 (×5): 3 mL via INTRAVENOUS

## 2023-06-08 MED ORDER — APALUTAMIDE 60 MG PO TABS
240.0000 mg | ORAL_TABLET | Freq: Every day | ORAL | Status: DC
  Administered 2023-06-09 – 2023-06-10 (×2): 240 mg via ORAL
  Filled 2023-06-08 (×3): qty 4

## 2023-06-08 MED ORDER — DAPAGLIFLOZIN PROPANEDIOL 5 MG PO TABS
5.0000 mg | ORAL_TABLET | Freq: Every day | ORAL | Status: DC
Start: 2023-06-08 — End: 2023-06-10
  Administered 2023-06-08 – 2023-06-10 (×3): 5 mg via ORAL
  Filled 2023-06-08 (×3): qty 1

## 2023-06-08 MED ORDER — ROSUVASTATIN CALCIUM 20 MG PO TABS
40.0000 mg | ORAL_TABLET | Freq: Every evening | ORAL | Status: DC
  Administered 2023-06-08 – 2023-06-09 (×2): 40 mg via ORAL
  Filled 2023-06-08 (×2): qty 2

## 2023-06-08 MED ORDER — FLUTICASONE PROPIONATE 50 MCG/ACT NA SUSP: 2.0000 | Freq: Every day | NASAL | Status: DC | PRN

## 2023-06-08 MED ORDER — PANTOPRAZOLE SODIUM 40 MG PO TBEC
40.0000 mg | DELAYED_RELEASE_TABLET | Freq: Every day | ORAL | Status: DC
  Administered 2023-06-08 – 2023-06-10 (×3): 40 mg via ORAL
  Filled 2023-06-08 (×3): qty 1

## 2023-06-08 MED ORDER — SODIUM CHLORIDE 0.9 % IV SOLN
2.0000 g | INTRAVENOUS | Status: DC
  Administered 2023-06-08 – 2023-06-09 (×2): 2 g via INTRAVENOUS
  Filled 2023-06-08 (×2): qty 20

## 2023-06-08 MED ORDER — ACETAMINOPHEN 650 MG RE SUPP: 650.0000 mg | Freq: Four times a day (QID) | RECTAL | Status: DC | PRN

## 2023-06-08 NOTE — H&P (Signed)
 History and Physical    Patient: Peter Dunlap FMW:[id number] DOB: 1956/01/08 DOA: 06/07/2023 DOS: the patient was seen and examined on 06/08/2023 PCP: Shelda Atlas, MD  Patient coming from: Home  Chief Complaint:  Chief Complaint  Patient presents with   Hematuria   HPI: Peter Dunlap is a 68 y.o. male with medical history significant of hypertension, hyperlipidemia, tachybradycardia syndrome s/p PM, paroxysmal atrial fibrillation, CAD, history of CNS, bleed prostate cancer, depression, and GERD presents with complaints of difficulty urinating and blood present anemia. Patient had reported having difficulty urinating intermittently over the last week with some burning present.  2 days ago he was seen at [facility] due to his symptoms and was evaluated by urology with Foley catheter placed.  He had been doing fine up until yesterday when catheter stopped draining.  Patient reported seeing blood clots present in the Foley bag and he was having significant suprapubic abdominal pain.  Denies having any significant fevers, shortness of breath, chest pain, nausea, or vomiting symptoms.  In the emergency department patient was noted to be afebrile with stable vital signs.  Labs from 1/2 noted hemoglobin 13.5, platelets 137, BUN 15, creatinine 1.39, and glucose 159.  Attempt to place Foley catheter was unsuccessful.  Urology had been formally consulted.  Patient was noted to have a distal urethral stricture which was dilated and catheter placed with orders for continuous bladder irrigation. Urinalysis did not show any bacteria, greater than 50 RBCs, and 11-20 WBCs  Review of Systems: As mentioned in the history of present illness. All other systems reviewed and are negative. Past Medical History:  Diagnosis Date   Arteriosclerotic cardiovascular disease (ASCVD) 2005   catheterization in 10/2010:50% mid LAD, diffuse distal disease, circumflex irregularities, large dominant RCA with a 50%  ostial, 70% distal, 60% posterolateral and 70% PDA; normal EF   Arthritis    Benign prostatic hypertrophy    Bilateral carpal tunnel syndrome 07/03/2018   Cerebrovascular disease 2010   R. carotid endarterectomy; Duplex in 10/2010-widely patent ICAs, subtotal left vertebral-not thought to be contributing to symptoms   Cervical spine disease    CT in 2012-advanced degeneration and spondylosis with moderate spinal stenosis at C3-C6   CHF (congestive heart failure) (HCC)    Depression    Diabetes mellitus without complication (HCC)    Erectile dysfunction    Family history of breast cancer    Family history of cancer of mouth    Family history of CML (chronic monocytic leukemia)    Family history of lung cancer    Family history of ovarian cancer    Family history of stomach cancer    Gastroesophageal reflux disease    H/O hiatal hernia    H/O: substance abuse (HCC)    Cocaine, marijuana, alcohol.  Quit 2013.    Hyperlipidemia    Hypertension    Non-ST elevation myocardial infarction (NSTEMI), initial episode of care (HCC) 12/02/2013   DES LAD   Obesity    Prostate cancer (HCC)    Sleep apnea    CPAP   Tachy-brady syndrome (HCC)    a. s/p STJ dual chamber PPM    Thyroid  disease    Tobacco abuse    Quit 2014   Ulnar neuropathy at elbow 07/03/2018   Bilateral   Past Surgical History:  Procedure Laterality Date   BRAIN SURGERY  2015   hematoma evacuation   BURR HOLE Right 04/13/2014   Procedure: SOLMON HERRLICH;  Surgeon: Victory DELENA Gunnels,  MD;  Location: [facility];  Service: Neurosurgery;  Laterality: Right;   CAROTID ENDARTERECTOMY Right Feb. 25, 2010    CEA   COLONOSCOPY WITH PROPOFOL  N/A 06/24/2021   Procedure: COLONOSCOPY WITH PROPOFOL ;  Surgeon: Rollin Dover, MD;  Location: [facility] ENDOSCOPY;  Service: Endoscopy;  Laterality: N/A;   CORONARY ANGIOPLASTY WITH STENT PLACEMENT  12/03/2013   LAD 90%-->0% W/ Promus Premier DES 3.0 mm x 16 mm, CFX OK, RCA 40%, EF 70-75%   LEFT ATRIAL  APPENDAGE OCCLUSION N/A 08/05/2015   Procedure: LEFT ATRIAL APPENDAGE OCCLUSION;  Surgeon: Lynwood Rakers, MD;  Location: [facility] INVASIVE CV LAB;  Service: Cardiovascular;  Laterality: N/A;   LEFT HEART CATH AND CORONARY ANGIOGRAPHY N/A 09/13/2021   Procedure: LEFT HEART CATH AND CORONARY ANGIOGRAPHY;  Surgeon: Wendel Lurena POUR, MD;  Location: [facility] INVASIVE CV LAB;  Service: Cardiovascular;  Laterality: N/A;   LEFT HEART CATHETERIZATION WITH CORONARY ANGIOGRAM Left 12/03/2013   Procedure: LEFT HEART CATHETERIZATION WITH CORONARY ANGIOGRAM;  Surgeon: Alm LELON Clay, MD;  Location: [facility] CATH LAB;  Service: Cardiovascular;  Laterality: Left;   LEFT HEART CATHETERIZATION WITH CORONARY ANGIOGRAM N/A 01/26/2014   Procedure: LEFT HEART CATHETERIZATION WITH CORONARY ANGIOGRAM;  Surgeon: Candyce GORMAN Reek, MD;  Location: [facility] CATH LAB;  Service: Cardiovascular;  Laterality: N/A;   LEFT HEART CATHETERIZATION WITH CORONARY ANGIOGRAM N/A 08/03/2014   Procedure: LEFT HEART CATHETERIZATION WITH CORONARY ANGIOGRAM;  Surgeon: Lonni JONETTA Cash, MD;  Location: [facility] CATH LAB;  Service: Cardiovascular;  Laterality: N/A;   PERCUTANEOUS CORONARY STENT INTERVENTION (PCI-S)  12/03/2013   Procedure: PERCUTANEOUS CORONARY STENT INTERVENTION (PCI-S);  Surgeon: Alm LELON Clay, MD;  Location: [facility] CATH LAB;  Service: Cardiovascular;;   PERMANENT PACEMAKER INSERTION N/A 09/18/2014   Procedure: PERMANENT PACEMAKER INSERTION;  Surgeon: Danelle LELON Birmingham, MD;  Location: [facility] CATH LAB;  Service: Cardiovascular;  Laterality: N/A;   POLYPECTOMY  06/24/2021   Procedure: POLYPECTOMY;  Surgeon: Rollin Dover, MD;  Location: [facility] ENDOSCOPY;  Service: Endoscopy;;   RADIOFREQUENCY ABLATION  2005   for PSVT   TEE WITHOUT CARDIOVERSION N/A 07/27/2015   Procedure: TRANSESOPHAGEAL ECHOCARDIOGRAM (TEE);  Surgeon: Redell GORMAN Shallow, MD;  Location: [facility] ENDOSCOPY;  Service: Cardiovascular;  Laterality: N/A;   TEE WITHOUT CARDIOVERSION N/A 09/15/2015   Procedure:  TRANSESOPHAGEAL ECHOCARDIOGRAM (TEE);  Surgeon: Aleene JINNY Passe, MD;  Location: [facility] ENDOSCOPY;  Service: Cardiovascular;  Laterality: N/A;   Social History:  reports that he quit smoking about 10 years ago. His smoking use included cigarettes. He started smoking about 50 years ago. He has a 40 pack-year smoking history. He has never used smokeless tobacco. He reports that he does not currently use alcohol. He reports that he does not use drugs.  Allergies  Allergen Reactions   Trazodone  And Nefazodone     Nightmares   Lactose Intolerance (Gi) Other (See Comments)    UPSET STOMACH     Family History  Problem Relation Age of Onset   Hypertension Mother        Cerebrovascular disease   Diabetes Mother    Coronary artery disease Father 14   Diabetes type II Father    Hypertension Father    Heart attack Father    Diabetes Brother    Hypertension Brother    Diabetes Sister    Hypertension Sister    Heart attack Sister 56   Leukemia Sister 69       CML   Breast cancer Maternal Grandmother        dx  60s   Lung cancer Maternal Uncle        dx >50, smoker   Cancer Cousin        mouth cancer, dx 44s, no smoking/chew tobacco hx (maternal 1st cousin)   Ovarian cancer Cousin        dx <50 (maternal 1st cousin)   Stomach cancer Cousin        dx 5s (maternal 1st cousin)   Cancer Cousin        type unknown to pt, dx >50 (paternal 1st cousin)    Prior to Admission medications   Medication Sig Start Date End Date Taking? Authorizing Provider  acetaminophen  (TYLENOL ) 500 MG tablet Take 500-1,000 mg by mouth every 6 (six) hours as needed for moderate pain (pain score 4-6).   Yes [provider]  allopurinol  (ZYLOPRIM ) 100 MG tablet TAKE 1 TABLET(100 MG) BY MOUTH DAILY 09/07/20  Yes Duanne Butler DASEN, MD  apalutamide  (ERLEADA ) 60 MG tablet TAKE 4 TABLETS (240 MG TOTAL) BY MOUTH DAILY. MAY BE TAKEN WITH OR WITHOUT FOOD. SWALLOW TABLETS WHOLE. 02/15/23 02/15/24 Yes Rogers Hai,  MD  aspirin  EC 325 MG tablet Take 1 tablet (325 mg total) daily by mouth. 04/13/17  Yes Weaver, Scott T, PA-C  Cyanocobalamin (VITAMIN B 12) 500 MCG TABS Take 500 mcg by mouth in the morning.   Yes [provider]  diclofenac  Sodium (VOLTAREN ) 1 % GEL Apply 2 g topically 2 (two) times daily as needed (Pain). 08/18/21  Yes [provider]  FARXIGA  5 MG TABS tablet Take 1 tablet (5 mg total) by mouth daily. 02/22/23  Yes Therisa Benton PARAS, NP  fluticasone  (FLONASE ) 50 MCG/ACT nasal spray Place 2 sprays into both nostrils daily. Patient taking differently: Place 2 sprays into both nostrils daily as needed for allergies or rhinitis. 06/02/20  Yes Chandra Harlene LABOR, NP  isosorbide  mononitrate (IMDUR ) 30 MG 24 hr tablet Take 1 tablet (30 mg total) by mouth daily. 07/27/22  Yes Lelon Hamilton T, PA-C  levothyroxine  (SYNTHROID ) 200 MCG tablet Take 1 tablet (200 mcg total) by mouth daily before breakfast. 02/22/23  Yes Reardon, Benton PARAS, NP  levothyroxine  (SYNTHROID ) 25 MCG tablet Take 1 tablet (25 mcg total) by mouth See admin instructions. Takes along with 200 mcg to total 225 mcg daily 02/22/23  Yes Reardon, Benton PARAS, NP  naloxone [facility]) nasal spray 4 mg/0.1 mL Place 4 mg into the nose once as needed (accidental overdose). 01/28/21  Yes [provider]  nitroGLYCERIN  (NITROSTAT ) 0.4 MG SL tablet PLACE 1 TABLET UNDER THE TONGUE EVERY 5 MINUTES AS NEEDED FOR CHEST PAIN. CALL 911 AT THIRD DOSE IN 15 MINUTES 09/07/22  Yes Leverne Charlies Helling, PA-C  oxyCODONE -acetaminophen  (PERCOCET) 10-325 MG tablet Take 1 tablet by mouth every 8 (eight) hours as needed for pain. 08/13/20  Yes [place], Theodoro FALCON, MD  pantoprazole  (PROTONIX ) 40 MG tablet TAKE 1 TABLET(40 MG) BY MOUTH DAILY 07/19/20  Yes [place], Theodoro FALCON, MD  rosuvastatin  (CRESTOR ) 40 MG tablet Take 40 mg by mouth every evening. 04/02/21  Yes [provider]  silodosin  (RAPAFLO ) 8 MG CAPS capsule TAKE 1 CAPSULE BY MOUTH TWICE DAILY  04/26/23  Yes Dahlstedt, Garnette, MD  solifenacin  (VESICARE ) 10 MG tablet TAKE 1 TABLET(10 MG) BY MOUTH DAILY 05/25/23  Yes Matilda Garnette, MD  sotalol  (BETAPACE ) 80 MG tablet Take 1 tablet (80 mg total) by mouth 2 (two) times daily. 11/13/22  Yes Waddell Danelle ORN, MD  Vibegron (GEMTESA) 75 MG TABS Take  75 mg by mouth every Monday, Wednesday, and Friday.   Yes [provider]  Vitamin D , Ergocalciferol , (DRISDOL ) 1.25 MG (50000 UNIT) CAPS capsule TAKE 1 CAPSULE BY MOUTH EVERY 7 DAYS Patient taking differently: Take 50,000 Units by mouth every 14 (fourteen) days. 06/25/20  Yes , Theodoro FALCON, MD  loratadine  (CLARITIN ) 10 MG tablet TAKE 1 TABLET(10 MG) BY MOUTH DAILY AS NEEDED FOR ALLERGIES 04/19/20   Bari Theodoro FALCON, MD  metFORMIN  (GLUCOPHAGE ) 500 MG tablet Take 1 tablet (500 mg total) by mouth daily with breakfast. Patient not taking: Reported on 06/08/2023 02/22/23   Therisa Benton PARAS, NP    Physical Exam: Vitals:   06/07/23 2238 06/08/23 0224 06/08/23 0702 06/08/23 1145  BP: 131/63 124/79 138/76 103/87  Pulse: 72 79 63   Resp: 18 20 20    Temp: 98.2 F (36.8 C) 98.1 F (36.7 C) 98.4 F (36.9 C)   TempSrc: Oral Oral Oral   SpO2: 100% 100% 100%   Weight:      Height:       Exam  Constitutional: Elderly obese male currently in no acute distress Eyes: PERRL, lids and conjunctivae normal ENMT: Mucous membranes are moist.  Normal dentition.  Neck: normal, supple, no masses, no thyromegaly Respiratory: clear to auscultation bilaterally, no wheezing, no crackles. Normal respiratory effort.   Cardiovascular: Regular rate and rhythm, no murmurs / rubs / gallops. No extremity edema. 2+ pedal pulses. No carotid bruits.  Abdomen: Mild suprapubic tenderness appreciated.  Bowel sounds present in all 4 quadrants.    Musculoskeletal: no clubbing / cyanosis. No joint deformity upper and lower extremities. Good ROM, no contractures. Normal muscle tone.  Skin: no rashes, lesions,  ulcers. No induration Neurologic: CN 2-12 grossly intact. Sensation intact, DTR normal. Strength 5/5 in all 4.  Psychiatric: Normal judgment and insight. Alert and oriented x 3. Normal mood.   Data Reviewed:  Reviewed labs, imaging, and pertinent records as documented.  Assessment and Plan:  Hematuria Bladder outlet obstruction Acute.  Patient presents with complaints of ability to void secondary to blood clots.  He had recently had Foley catheter placed on 1/1.  However, catheter became obstructed.  Urology consulted due to inability to place Foley catheter due to stricture which was dilated.  Foley catheter was subsequently able to be placed and he was started on three-way bladder irrigation. Urinalysis did not show any bacteria, greater than 50 RBCs, and 11-20 WBCs.  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  Adjust antibiotics as needed. -Appreciate urology consultative services we will follow-up for any further recommendations.  Renal insufficiency On admission creatinine elevated at 1.39 with BUN 15.  Baseline creatinine previously noted to be 1 -1.2. -Continue to monitor kidney function  Essential hypertension On admission blood pressures 105/58 to 138/76. -Continue sotalol  and isosorbide  mononitrate as tolerated  Diabetes mellitus type 2, without long-term use of insulin  Last available hemoglobin A1c was 6 when checked on 12/18/2022. -Continue Farxiga  -Hold metformin   Thrombocytopenia Chronic.  Platelet count 137. -Continue to monitor  Paroxysmal atrial fibrillation Tachybradycardia syndrome Status post pacemaker Patient has prior history of paroxysmal SVT and atrial fibrillation status post ablation.  Patient s/p permanent permanent pacemaker and is followed by Dr. Waddell.  Last device check on  12/4 noted normal device function with 3 episodes of atrial fibrillation noted longest duration 5 minutes and 24  seconds.  Patient not on anticoagulation due to history of CNS bleed.  He is s/p Watchman procedure. -Continue sotalol  -Continue outpatient follow-up with Dr. Waddell  Hypothyroidism -Continue levothyroxine   Mixed hyperlipidemia -Continue Crestor   History of prostate cancer BPH Patient had prior radiation treatment and is on Erleada  and [facility] in the outpatient setting as well as Dr. Rogers of oncology. -Continue home medication regimen  GERD -Continue Protonix   OSA -Continue CPAP nightly  Obesity BMI 39.06 kg/m -Continue outpatient follow-up with primary in regards to need of weight loss  DVT prophylaxis: SCDs  Advance Care Planning:   Code Status: Full Code    Consults: Urology  Family Communication: Wife updated at bedside  Severity of Illness: The appropriate patient status for this patient is OBSERVATION. Observation status is judged to be reasonable and necessary in order to provide the required intensity of service to ensure the patient's safety. The patient's presenting symptoms, physical exam findings, and initial radiographic and laboratory data in the context of their medical condition is felt to place them at decreased risk for further clinical deterioration. Furthermore, it is anticipated that the patient will be medically stable for discharge from the hospital within 2 midnights of admission.   Author: Maximino DELENA Sharps, MD 06/08/2023 12:52 PM  For on call review [email or web address].

## 2023-06-08 NOTE — ED Provider Notes (Signed)
 [place] [facility]ERGENCY DEPARTMENT AT [facility] Provider Note   CSN: [id number] Arrival date & time: 06/07/23  1903     History  Chief Complaint  Patient presents with   Hematuria    Peter Dunlap is a 68 y.o. male.  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 Matilda.  Past urologic history significant for prostate cancer Gleason 4+3=7 from 01/18/2016 biopsy. S/p 40 sessions external beam radiation, completed on 06/14/2016.  Recurrence noted on TRUS biopsy on 01/27/2020 and PSMA PET scan showing multiple PET avid lymph nodes on 04/12/2020.  He was started on ADT with Firmagon  and Erleada  on 04/21/2020.   Patient was seen at [facility] on [id number] for urinary retention.  A Foley catheter was placed and patient got relief and was having drainage.  Yesterday, patient stopped having adequate drainage from the catheter and blood clots were present.  He developed suprapubic discomfort.  Patient reports prior to the catheter being placed he had urinated a large amount of blood and clots.  It came about quite quickly.  Denies he had fevers or chills.  He had abdominal discomfort when his bladder was full but it improved with emptying.    Home Medications Prior to Admission medications   Medication Sig Start Date End Date Taking? Authorizing Provider  acetaminophen  (TYLENOL ) 500 MG tablet Take 500-1,000 mg by mouth every 6 (six) hours as needed for moderate pain (pain score 4-6).   Yes [provider]  allopurinol  (ZYLOPRIM ) 100 MG tablet TAKE 1 TABLET(100 MG) BY MOUTH DAILY 09/07/20  Yes Duanne Butler DASEN, MD  apalutamide  (ERLEADA ) 60 MG tablet TAKE 4 TABLETS (240 MG TOTAL) BY MOUTH DAILY. MAY BE TAKEN WITH OR WITHOUT FOOD. SWALLOW TABLETS WHOLE. 02/15/23 02/15/24 Yes Rogers Hai, MD  aspirin  EC 325 MG tablet Take 1 tablet (325 mg total) daily by mouth. 04/13/17  Yes Weaver, Scott T, PA-C   Cyanocobalamin (VITAMIN B 12) 500 MCG TABS Take 500 mcg by mouth in the morning.   Yes [provider]  diclofenac  Sodium (VOLTAREN ) 1 % GEL Apply 2 g topically 2 (two) times daily as needed (Pain). 08/18/21  Yes [provider]  FARXIGA  5 MG TABS tablet Take 1 tablet (5 mg total) by mouth daily. 02/22/23  Yes Therisa Benton PARAS, NP  fluticasone  (FLONASE ) 50 MCG/ACT nasal spray Place 2 sprays into both nostrils daily. Patient taking differently: Place 2 sprays into both nostrils daily as needed for allergies or rhinitis. 06/02/20  Yes Chandra Harlene LABOR, NP  isosorbide  mononitrate (IMDUR ) 30 MG 24 hr tablet Take 1 tablet (30 mg total) by mouth daily. 07/27/22  Yes Lelon Hamilton T, PA-C  levothyroxine  (SYNTHROID ) 200 MCG tablet Take 1 tablet (200 mcg total) by mouth daily before breakfast. 02/22/23  Yes Therisa Benton PARAS, NP  levothyroxine  (SYNTHROID ) 25 MCG tablet Take 1 tablet (25 mcg total) by mouth See admin instructions. Takes along with 200 mcg to total 225 mcg daily 02/22/23  Yes Reardon, Benton PARAS, NP  naloxone [facility]) nasal spray 4 mg/0.1 mL Place 4 mg into the nose once as needed (accidental overdose). 01/28/21  Yes [provider]  nitroGLYCERIN  (NITROSTAT ) 0.4 MG SL tablet PLACE 1 TABLET UNDER THE TONGUE EVERY 5 MINUTES AS NEEDED FOR CHEST PAIN. CALL 911 AT THIRD DOSE IN 15 MINUTES 09/07/22  Yes Leverne Charlies Helling, PA-C  oxyCODONE -acetaminophen  (PERCOCET) 10-325 MG tablet Take 1 tablet by mouth every 8 (  eight) hours as needed for pain. 08/13/20  Yes Neligh, Theodoro FALCON, MD  pantoprazole  (PROTONIX ) 40 MG tablet TAKE 1 TABLET(40 MG) BY MOUTH DAILY 07/19/20  Yes Dickson, Theodoro FALCON, MD  rosuvastatin  (CRESTOR ) 40 MG tablet Take 40 mg by mouth every evening. 04/02/21  Yes [provider]  silodosin  (RAPAFLO ) 8 MG CAPS capsule TAKE 1 CAPSULE BY MOUTH TWICE DAILY 04/26/23  Yes Dahlstedt, Garnette, MD  solifenacin  (VESICARE ) 10 MG tablet TAKE 1 TABLET(10 MG) BY MOUTH DAILY  05/25/23  Yes Matilda Garnette, MD  sotalol  (BETAPACE ) 80 MG tablet Take 1 tablet (80 mg total) by mouth 2 (two) times daily. 11/13/22  Yes Waddell Danelle ORN, MD  Vibegron [facility]) 75 MG TABS Take 75 mg by mouth every Monday, Wednesday, and Friday.   Yes [provider]  Vitamin D , Ergocalciferol , (DRISDOL ) 1.25 MG (50000 UNIT) CAPS capsule TAKE 1 CAPSULE BY MOUTH EVERY 7 DAYS Patient taking differently: Take 50,000 Units by mouth every 14 (fourteen) days. 06/25/20  Yes , Theodoro FALCON, MD  loratadine  (CLARITIN ) 10 MG tablet TAKE 1 TABLET(10 MG) BY MOUTH DAILY AS NEEDED FOR ALLERGIES 04/19/20   Bari Theodoro FALCON, MD  metFORMIN  (GLUCOPHAGE ) 500 MG tablet Take 1 tablet (500 mg total) by mouth daily with breakfast. Patient not taking: Reported on 06/08/2023 02/22/23   Therisa Benton PARAS, NP      Allergies    Trazodone  and nefazodone and Lactose intolerance (gi)    Review of Systems   Review of Systems  Physical Exam Updated Vital Signs BP 103/87   Pulse 63   Temp 98.4 F (36.9 C) (Oral)   Resp 20   Ht 6' (1.829 m)   Wt 130.6 kg   SpO2 100%   BMI 39.06 kg/m  Physical Exam Constitutional:      Comments: Alert nontoxic.  No respiratory distress.  HENT:     Mouth/Throat:     Pharynx: Oropharynx is clear.  Cardiovascular:     Rate and Rhythm: Normal rate and regular rhythm.  Pulmonary:     Effort: Pulmonary effort is normal.     Breath sounds: Normal breath sounds.  Abdominal:     Comments: Soft.  Mild suprapubic discomfort.  No guarding.  Genitourinary:    Comments: Normal appearance of penis.  There is a Foley catheter in place.  Very bloody urine with clots in the bag. Musculoskeletal:        General: Normal range of motion.  Skin:    General: Skin is warm and dry.  Neurological:     General: No focal deficit present.     Mental Status: He is oriented to person, place, and time.  Psychiatric:        Mood and Affect: Mood normal.     ED Results / Procedures /  Treatments   Labs (all labs ordered are listed, but only abnormal results are displayed) Labs Reviewed  URINALYSIS, ROUTINE W REFLEX MICROSCOPIC - Abnormal; Notable for the following components:      Result Value   Color, Urine AMBER (*)    APPearance CLOUDY (*)    Glucose, UA   (*)    Value: TEST NOT REPORTED DUE TO COLOR INTERFERENCE OF URINE PIGMENT   Hgb urine dipstick   (*)    Value: TEST NOT REPORTED DUE TO COLOR INTERFERENCE OF URINE PIGMENT   Bilirubin Urine   (*)    Value: TEST NOT REPORTED DUE TO COLOR INTERFERENCE OF URINE PIGMENT   Ketones, ur   (*)  Value: TEST NOT REPORTED DUE TO COLOR INTERFERENCE OF URINE PIGMENT   Protein, ur   (*)    Value: TEST NOT REPORTED DUE TO COLOR INTERFERENCE OF URINE PIGMENT   Nitrite   (*)    Value: TEST NOT REPORTED DUE TO COLOR INTERFERENCE OF URINE PIGMENT   Leukocytes,Ua   (*)    Value: TEST NOT REPORTED DUE TO COLOR INTERFERENCE OF URINE PIGMENT   All other components within normal limits  CBC - Abnormal; Notable for the following components:   RBC 4.19 (*)    Platelets 137 (*)    All other components within normal limits  BASIC METABOLIC PANEL - Abnormal; Notable for the following components:   CO2 19 (*)    Glucose, Bld 159 (*)    Creatinine, Ser 1.39 (*)    GFR, Estimated 56 (*)    All other components within normal limits  URINALYSIS, MICROSCOPIC (REFLEX)    EKG None  Radiology No results found.  Procedures Procedures   Attempted Foley change for three-way hematuria catheter.  Patient was prepped and draped with sterile fashion.  Uro-Jet lidocaine  used.  24 French three-way hematuria catheter was too large to enter the urethral meatus which appears to have some amount of stricture.  At this time, no additional smaller catheters are in the urology cart.  We will have to order from surgical supply.  Patient is comfortable at this time. Medications Ordered in ED Medications  oxyCODONE -acetaminophen  (PERCOCET/ROXICET)  5-325 MG per tablet 1 tablet (1 tablet Oral Given 06/08/23 0756)    ED Course/ Medical Decision Making/ A&P                                 Medical Decision Making Amount and/or Complexity of Data Reviewed Labs: ordered.  Risk Prescription drug management.   Patient has known history of prostate cancer.  He presented with urinary retention 2 days ago and a Foley catheter was successfully placed.  Subsequent to this he had recurrence of clot and obstruction.  Attempt was made to irrigate and there was some success with irrigation of the existing catheter however there continue to be clotting and bleeding.  Consult: Reviewed with urology NP Ole Bourdon.  Will come to evaluate the patient in the emergency department.  Recommands three-way catheter placement.  I attempted placement of 24-gauge three-way catheter.  Patient has narrow urethral opening which cannot accommodate the larger catheter.  Will await urology for placement.  Urology has placed the three-way catheter.  The patient is draining urine that is blood-tinged.  Will continue with monitoring and irrigation.  At this time patient does not have any pain complaints and feels improved.  Consult: Reviewed with Dr. Claudene for admission.        Final Clinical Impression(s) / ED Diagnoses Final diagnoses:  Gross hematuria  Urinary retention  Prostate cancer [facility])    Rx / DC Orders ED Discharge Orders     None         Armenta Canning, MD 06/08/23 1258

## 2023-06-08 NOTE — Progress Notes (Signed)
 Per RPH: "Erleada 240mg : Not available in hospital. Must be provided by patient/family if needed during hospital stay."  Patient informed of the situation.

## 2023-06-08 NOTE — Telephone Encounter (Signed)
 Patient wife called Peter Dunlap has been admitted to [facility] for bleeding from prostate and a blood clot.  Patient wife would like Dr Retta Diones to see him in the hospital if possible.

## 2023-06-08 NOTE — Plan of Care (Signed)

## 2023-06-08 NOTE — Procedures (Signed)
   Urology Procedure Note:   [name] is a 68 year old male followed by Dr. 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  Please see separate consult note.  Patient had been temporized for short period of time with a smaller bore catheter on initial arrival, which quickly clot obstructed.  On my arrival this had been removed and he did not have a Foley in place, though he was comfortable.  After briefly discussing the case and plan patient consented to placement of three-way hematuria catheter and initiation of CBI.  Patient was prepped and draped in the usual sterile fashion.  Next, 10 cc of sterile lubricant was injected directly into the urethra.  At this point I noticed that patient had a very distal urethral stricture and the 61f hematuria catheter and selected would not pass.  I discussed with patient and he gave approval to proceed with urethral dilation.  Initial dilator passed was 68f.  Next a short sensor wire was advanced without difficulty.  I dilated the stricture serially from 55F-41F.  At this point the hematuria catheter was able to be passed to the level of the bladder with minimal resistance.  There was immediate return of bloody urine.  After confirming placement, the retention balloon was inflated with 30 cc of sterile water  and catheter was placed to gravity drainage without dependent loops.  I attempted to hand irrigate and after about 200 cc of sterile water  I was still unable to extract any clot material.  Patient was placed on CBI with a low gtt., resulting in light pink irrigant.

## 2023-06-08 NOTE — Telephone Encounter (Signed)
 Just an [place]

## 2023-06-08 NOTE — ED Notes (Signed)
 Stating that pain is "15/10" in pelvic area. Feels "blocked up again in catheter since last no urine has come out since 2241. Triage nurse notified.

## 2023-06-08 NOTE — Consult Note (Signed)
 Urology Consult Note   Requesting Attending Physician:  Armenta Canning, MD Service Providing Consult: Urology  Consulting Attending: Dr. Renda   Reason for Consult:  hematuria  HPI: Peter Dunlap is seen in consultation for reasons noted above at the request of Armenta Canning, MD. patient presented to [facility] emergency department complaining of hematuria with urinary retention on 06/06/2023.  It was felt that hematuria cleared to an acceptable amount and patient was discharged home with Foley catheter and leg bag with instruction to follow-up closely with urology the next day.  He presents to [facility] emergency department yesterday evening with complaint that his Foley catheter has stopped draining.  He is followed by Dr. 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  Past urologic history significant for prostate cancer Gleason 4+3=7 from 01/18/2016 biopsy. S/p 40 sessions external beam radiation, completed on 06/14/2016.  Recurrence noted on TRUS biopsy on 01/27/2020 and PSMA PET scan showing multiple PET avid lymph nodes on 04/12/2020.  He was started on ADT with Firmagon  and Erleada  on 04/21/2020.  On arrival he was alert and oriented. He was in no distress and had been temporarily decompressed with smaller foley. He reports occasional blush of blood, though no pronounced bleeding.   ------------------  Assessment:  68 y.o. male with hematuria with clot retention. On CBI. Hx of prostate cancer. Now dilated distal urethral stricture.   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  Would benefit from flexible cystoscopy on an outpatient basis. Recommend exchange conventional catheter for hematuria catheter, hand irrigate, and start CBI if necessary. Trend H&H.  Stable at 13.5 right now, improved half a point from yesterday. CT hematuria will need to be collected at  some point.  #urethral stricture 1f dilated to 22f at bedside with placement of 4f hematuria coude catheter.. Leave foley for at least 3 days if hematuria clears before that.  See sperate procedure note.   Case and plan discussed with Dr. Renda  Past Medical History: Past Medical History:  Diagnosis Date   Arteriosclerotic cardiovascular disease (ASCVD) 2005   catheterization in 10/2010:50% mid LAD, diffuse distal disease, circumflex irregularities, large dominant RCA with a 50% ostial, 70% distal, 60% posterolateral and 70% PDA; normal EF   Arthritis    Benign prostatic hypertrophy    Bilateral carpal tunnel syndrome 07/03/2018   Cerebrovascular disease 2010   R. carotid endarterectomy; Duplex in 10/2010-widely patent ICAs, subtotal left vertebral-not thought to be contributing to symptoms   Cervical spine disease    CT in 2012-advanced degeneration and spondylosis with moderate spinal stenosis at C3-C6   CHF (congestive heart failure) (HCC)    Depression    Diabetes mellitus without complication (HCC)    Erectile dysfunction    Family history of breast cancer    Family history of cancer of mouth    Family history of CML (chronic monocytic leukemia)    Family history of lung cancer    Family history of ovarian cancer    Family history of stomach cancer    Gastroesophageal reflux disease    H/O hiatal hernia    H/O: substance abuse (HCC)    Cocaine, marijuana, alcohol.  Quit 2013.    Hyperlipidemia    Hypertension    Non-ST elevation myocardial infarction (NSTEMI), initial episode of care [facility]) 12/02/2013   DES LAD   Obesity    Prostate cancer [facility])    Sleep apnea  CPAP   Tachy-brady syndrome (HCC)    a. 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 25, 2010    CEA   COLONOSCOPY WITH PROPOFOL  N/A 06/24/2021   Procedure: COLONOSCOPY WITH PROPOFOL ;  Surgeon: Rollin Dover, MD;  Location: [facility] ENDOSCOPY;  Service: Endoscopy;  Laterality: N/A;   CORONARY ANGIOPLASTY WITH STENT PLACEMENT  12/03/2013   LAD 90%-->0% W/ Promus Premier DES 3.0 mm x 16 mm, CFX OK, RCA 40%, EF 70-75%   LEFT ATRIAL APPENDAGE OCCLUSION N/A 08/05/2015   Procedure: LEFT ATRIAL APPENDAGE OCCLUSION;  Surgeon: Lynwood Rakers, MD;  Location: [facility] INVASIVE CV LAB;  Service: Cardiovascular;  Laterality: N/A;   LEFT HEART CATH AND CORONARY ANGIOGRAPHY N/A 09/13/2021   Procedure: LEFT HEART CATH AND CORONARY ANGIOGRAPHY;  Surgeon: Wendel Lurena POUR, MD;  Location: [facility] INVASIVE CV LAB;  Service: Cardiovascular;  Laterality: N/A;   LEFT HEART CATHETERIZATION WITH CORONARY ANGIOGRAM Left 12/03/2013   Procedure: LEFT HEART CATHETERIZATION WITH CORONARY ANGIOGRAM;  Surgeon: Alm LELON Clay, MD;  Location: [facility] CATH LAB;  Service: Cardiovascular;  Laterality: Left;   LEFT HEART CATHETERIZATION WITH CORONARY ANGIOGRAM N/A 01/26/2014   Procedure: LEFT HEART CATHETERIZATION WITH CORONARY ANGIOGRAM;  Surgeon: Candyce GORMAN Reek, MD;  Location: [facility] CATH LAB;  Service: Cardiovascular;  Laterality: N/A;   LEFT HEART CATHETERIZATION WITH CORONARY ANGIOGRAM N/A 08/03/2014   Procedure: LEFT HEART CATHETERIZATION WITH CORONARY ANGIOGRAM;  Surgeon: Lonni JONETTA Cash, MD;  Location: [facility] CATH LAB;  Service: Cardiovascular;  Laterality: N/A;   PERCUTANEOUS CORONARY STENT INTERVENTION (PCI-S)  12/03/2013   Procedure: PERCUTANEOUS CORONARY STENT INTERVENTION (PCI-S);  Surgeon: Alm LELON Clay, MD;  Location[facility] CATH LAB;  Service: Cardiovascular;;   PERMANENT PACEMAKER INSERTION N/A 09/18/2014   Procedure: PERMANENT PACEMAKER INSERTION;  Surgeon:  Danelle LELON Birmingham, MD;  Location: [facility] CATH LAB;  Service: Cardiovascular;  Laterality: N/A;   POLYPECTOMY  06/24/2021   Procedure: POLYPECTOMY;  Surgeon: Rollin Dover, MD;  Location: [facility] ENDOSCOPY;  Service: Endoscopy;;   RADIOFREQUENCY ABLATION  2005   for PSVT   TEE WITHOUT CARDIOVERSION N/A 07/27/2015   Procedure: TRANSESOPHAGEAL ECHOCARDIOGRAM (TEE);  Surgeon: Redell GORMAN Shallow, MD;  Location: [facility] ENDOSCOPY;  Service: Cardiovascular;  Laterality: N/A;   TEE WITHOUT CARDIOVERSION N/A 09/15/2015   Procedure: TRANSESOPHAGEAL ECHOCARDIOGRAM (TEE);  Surgeon: Aleene JINNY Passe, MD;  Location: [facility] ENDOSCOPY;  Service: Cardiovascular;  Laterality: N/A;    Medication: No current facility-administered medications for this encounter.   Current Outpatient Medications  Medication Sig Dispense Refill   acetaminophen  (TYLENOL ) 500 MG tablet Take 500-1,000 mg by mouth every 6 (six) hours as needed for moderate pain (pain score 4-6).     allopurinol  (ZYLOPRIM ) 100 MG tablet TAKE 1 TABLET(100 MG) BY MOUTH DAILY 90 tablet 0   apalutamide  (ERLEADA ) 60 MG tablet TAKE 4 TABLETS (240 MG TOTAL) BY MOUTH DAILY. MAY BE TAKEN WITH OR WITHOUT FOOD. SWALLOW TABLETS WHOLE. 120 tablet 3   aspirin  EC 325 MG tablet Take 1 tablet (325 mg total) daily by mouth. 30 tablet  0   Cyanocobalamin (VITAMIN B 12) 500 MCG TABS Take 500 mcg by mouth in the morning.     diclofenac  Sodium (VOLTAREN ) 1 % GEL Apply 2 g topically 2 (two) times daily as needed (Pain).     FARXIGA  5 MG TABS tablet Take 1 tablet (5 mg total) by mouth daily. 90 tablet 3   fluticasone  (FLONASE ) 50 MCG/ACT nasal spray Place 2 sprays into both nostrils daily. (Patient taking differently: Place 2 sprays into both nostrils daily as needed for allergies or rhinitis.) 16 g 6   isosorbide  mononitrate (IMDUR ) 30 MG 24 hr tablet Take 1 tablet (30 mg total) by mouth daily. 90 tablet 3   levothyroxine  (SYNTHROID ) 200 MCG tablet Take 1 tablet (200 mcg total) by mouth daily before  breakfast. 90 tablet 3   levothyroxine  (SYNTHROID ) 25 MCG tablet Take 1 tablet (25 mcg total) by mouth See admin instructions. Takes along with 200 mcg to total 225 mcg daily 90 tablet 3   naloxone (NARCAN) nasal spray 4 mg/0.1 mL Place 4 mg into the nose once as needed (accidental overdose).     nitroGLYCERIN  (NITROSTAT ) 0.4 MG SL tablet PLACE 1 TABLET UNDER THE TONGUE EVERY 5 MINUTES AS NEEDED FOR CHEST PAIN. CALL 911 AT THIRD DOSE IN 15 MINUTES 25 tablet 11   oxyCODONE -acetaminophen  (PERCOCET) 10-325 MG tablet Take 1 tablet by mouth every 8 (eight) hours as needed for pain. 90 tablet 0   pantoprazole  (PROTONIX ) 40 MG tablet TAKE 1 TABLET(40 MG) BY MOUTH DAILY 30 tablet 3   rosuvastatin  (CRESTOR ) 40 MG tablet Take 40 mg by mouth every evening.     silodosin  (RAPAFLO ) 8 MG CAPS capsule TAKE 1 CAPSULE BY MOUTH TWICE DAILY 180 capsule 3   solifenacin  (VESICARE ) 10 MG tablet TAKE 1 TABLET(10 MG) BY MOUTH DAILY 30 tablet 11   sotalol  (BETAPACE ) 80 MG tablet Take 1 tablet (80 mg total) by mouth 2 (two) times daily. 180 tablet 3   Vibegron (GEMTESA) 75 MG TABS Take 75 mg by mouth every Monday, Wednesday, and Friday.     Vitamin D , Ergocalciferol , (DRISDOL ) 1.25 MG (50000 UNIT) CAPS capsule TAKE 1 CAPSULE BY MOUTH EVERY 7 DAYS (Patient taking differently: Take 50,000 Units by mouth every 14 (fourteen) days.) 12 capsule 0   loratadine  (CLARITIN ) 10 MG tablet TAKE 1 TABLET(10 MG) BY MOUTH DAILY AS NEEDED FOR ALLERGIES 30 tablet 2   metFORMIN  (GLUCOPHAGE ) 500 MG tablet Take 1 tablet (500 mg total) by mouth daily with breakfast. (Patient not taking: Reported on 06/08/2023) 90 tablet 3    Allergies: Allergies  Allergen Reactions   Trazodone  And Nefazodone     Nightmares   Lactose Intolerance (Gi) Other (See Comments)    UPSET STOMACH     Social History: Social History   Tobacco Use   Smoking status: Former    Current packs/day: 0.00    Average packs/day: 1 pack/day for 40.0 years (40.0 ttl pk-yrs)     Types: Cigarettes    Start date: 10/20/1972    Quit date: 10/10/2012    Years since quitting: 10.6   Smokeless tobacco: Never   Tobacco comments:    Quit in May.   Vaping Use   Vaping status: Never Used  Substance Use Topics   Alcohol use: Not Currently    Comment: former drinker-- sober since 2013.    Drug use: No    Types: Cocaine    Comment: quit cocaine 10/2011    Family History Family History  Problem Relation Age of Onset   Hypertension Mother        Cerebrovascular disease   Diabetes Mother    Coronary artery disease Father 97   Diabetes type II Father    Hypertension Father    Heart attack Father    Diabetes Brother    Hypertension Brother    Diabetes Sister    Hypertension Sister    Heart attack Sister 78   Leukemia Sister 42       CML   Breast cancer Maternal Grandmother        dx 72s   Lung cancer Maternal Uncle        dx >50, smoker   Cancer Cousin        mouth cancer, dx 48s, no smoking/chew tobacco hx (maternal 1st cousin)   Ovarian cancer Cousin        dx <50 (maternal 1st cousin)   Stomach cancer Cousin        dx 13s (maternal 1st cousin)   Cancer Cousin        type unknown to pt, dx >50 (paternal 1st cousin)    Review of Systems  Genitourinary:  Positive for hematuria.     Objective   Vital signs in last 24 hours: BP 138/76 (BP Location: Right Wrist)   Pulse 63   Temp 98.4 F (36.9 C) (Oral)   Resp 20   Ht 6' (1.829 m)   Wt 130.6 kg   SpO2 100%   BMI 39.06 kg/m   Physical Exam General: NAD, A&O, resting, appropriate HEENT: [place]/AT Pulmonary: Normal work of breathing Cardiovascular: RRR, no cyanosis Abdomen: Soft, NTTP, nondistended GU: 63f 3-way hematuria coude in place with CBI on low gtt Neuro: Appropriate, no focal neurological deficits  Most Recent Labs: Lab Results  Component Value Date   WBC 9.1 06/07/2023   HGB 13.5 06/07/2023   HCT 40.8 06/07/2023   PLT 137 (L) 06/07/2023    Lab Results  Component Value Date    NA 139 06/07/2023   K 4.2 06/07/2023   CL 109 06/07/2023   CO2 19 (L) 06/07/2023   BUN 15 06/07/2023   CREATININE 1.39 (H) 06/07/2023   CALCIUM  9.3 06/07/2023   MG 2.4 12/18/2022   PHOS 3.7 12/20/2014    Lab Results  Component Value Date   INR 2.4 09/13/2015   APTT 38 (H) 07/31/2014     Urine Culture: @[id number] (laburin,org,[id number],[id number])@   IMAGING: No results found.  ------  Ole Bourdon, NP Pager: [phone]   Please contact the urology consult pager with any further questions/concerns.

## 2023-06-08 NOTE — ED Notes (Signed)
 ED TO INPATIENT HANDOFF REPORT  ED Nurse Name and Phone #: Alan RN [phone]  S Name/Age/Gender Peter Dunlap 68 y.o. male Room/Bed: 016C/016C  Code Status   Code Status: Prior  Home/SNF/Other Home Patient oriented to: self, place, time, and situation Is this baseline? Yes   Triage Complete: Triage complete  Chief Complaint Hematuria [R31.9]  Triage Note Seen at [facility] yesterday for same complaints. Here today for hematuria. On chemo for prostate cancer. Foley was started yesterday. Not on blood thinners. Pt states foley catheter stopped draining since 1330 today.    Allergies Allergies  Allergen Reactions   Trazodone  And Nefazodone     Nightmares   Lactose Intolerance (Gi) Other (See Comments)    UPSET STOMACH     Level of Care/Admitting Diagnosis ED Disposition     ED Disposition  Admit   Condition  --   Comment  Hospital Area: [facility] [[id number]]  Level of Care: Telemetry Medical [104]  May place patient in observation at [facility] or [facility] if equivalent level of care is available:: No  Covid Evaluation: Asymptomatic - no recent exposure (last 10 days) testing not required  Diagnosis: Hematuria [258241]  Admitting Physician: CLAUDENE MAXIMINO LABOR [[id number]]  Attending Physician: CLAUDENE MAXIMINO LABOR [[id number]]          B Medical/Surgery History Past Medical History:  Diagnosis Date   Arteriosclerotic cardiovascular disease (ASCVD) 2005   catheterization in 10/2010:50% mid LAD, diffuse distal disease, circumflex irregularities, large dominant RCA with a 50% ostial, 70% distal, 60% posterolateral and 70% PDA; normal EF   Arthritis    Benign prostatic hypertrophy    Bilateral carpal tunnel syndrome 07/03/2018   Cerebrovascular disease 2010   R. carotid endarterectomy; Duplex in 10/2010-widely patent ICAs, subtotal left vertebral-not thought to be contributing to symptoms   Cervical spine disease    CT in 2012-advanced  degeneration and spondylosis with moderate spinal stenosis at C3-C6   CHF (congestive heart failure) (HCC)    Depression    Diabetes mellitus without complication (HCC)    Erectile dysfunction    Family history of breast cancer    Family history of cancer of mouth    Family history of CML (chronic monocytic leukemia)    Family history of lung cancer    Family history of ovarian cancer    Family history of stomach cancer    Gastroesophageal reflux disease    H/O hiatal hernia    H/O: substance abuse (HCC)    Cocaine, marijuana, alcohol.  Quit 2013.    Hyperlipidemia    Hypertension    Non-ST elevation myocardial infarction (NSTEMI), initial episode of care (HCC) 12/02/2013   DES LAD   Obesity    Prostate cancer (HCC)    Sleep apnea    CPAP   Tachy-brady syndrome (HCC)    a. s/p STJ dual chamber PPM    Thyroid  disease    Tobacco abuse    Quit 2014   Ulnar neuropathy at elbow 07/03/2018   Bilateral   Past Surgical History:  Procedure Laterality Date   BRAIN SURGERY  2015   hematoma evacuation   BURR HOLE Right 04/13/2014   Procedure: SOLMON HERRLICH;  Surgeon: Victory LABOR Gunnels, MD;  Location: [facility] NEURO ORS;  Service: Neurosurgery;  Laterality: Right;   CAROTID ENDARTERECTOMY Right Feb. 25, 2010    CEA   COLONOSCOPY WITH PROPOFOL  N/A 06/24/2021   Procedure: COLONOSCOPY WITH PROPOFOL ;  Surgeon: Rollin Dover, MD;  Location: [facility] ENDOSCOPY;  Service: Endoscopy;  Laterality: N/A;   CORONARY ANGIOPLASTY WITH STENT PLACEMENT  12/03/2013   LAD 90%-->0% W/ Promus Premier DES 3.0 mm x 16 mm, CFX OK, RCA 40%, EF 70-75%   LEFT ATRIAL APPENDAGE OCCLUSION N/A 08/05/2015   Procedure: LEFT ATRIAL APPENDAGE OCCLUSION;  Surgeon: Lynwood Rakers, MD;  Location: [facility] INVASIVE CV LAB;  Service: Cardiovascular;  Laterality: N/A;   LEFT HEART CATH AND CORONARY ANGIOGRAPHY N/A 09/13/2021   Procedure: LEFT HEART CATH AND CORONARY ANGIOGRAPHY;  Surgeon: Wendel Lurena POUR, MD;  Location: [facility] INVASIVE CV LAB;  Service:  Cardiovascular;  Laterality: N/A;   LEFT HEART CATHETERIZATION WITH CORONARY ANGIOGRAM Left 12/03/2013   Procedure: LEFT HEART CATHETERIZATION WITH CORONARY ANGIOGRAM;  Surgeon: Alm LELON Clay, MD;  Location: [facility] CATH LAB;  Service: Cardiovascular;  Laterality: Left;   LEFT HEART CATHETERIZATION WITH CORONARY ANGIOGRAM N/A 01/26/2014   Procedure: LEFT HEART CATHETERIZATION WITH CORONARY ANGIOGRAM;  Surgeon: Candyce GORMAN Reek, MD;  Location: [facility] CATH LAB;  Service: Cardiovascular;  Laterality: N/A;   LEFT HEART CATHETERIZATION WITH CORONARY ANGIOGRAM N/A 08/03/2014   Procedure: LEFT HEART CATHETERIZATION WITH CORONARY ANGIOGRAM;  Surgeon: Lonni JONETTA Cash, MD;  Location: [facility] CATH LAB;  Service: Cardiovascular;  Laterality: N/A;   PERCUTANEOUS CORONARY STENT INTERVENTION (PCI-S)  12/03/2013   Procedure: PERCUTANEOUS CORONARY STENT INTERVENTION (PCI-S);  Surgeon: Alm LELON Clay, MD;  Location: [facility] CATH LAB;  Service: Cardiovascular;;   PERMANENT PACEMAKER INSERTION N/A 09/18/2014   Procedure: PERMANENT PACEMAKER INSERTION;  Surgeon: Danelle LELON Birmingham, MD;  Location: [facility] CATH LAB;  Service: Cardiovascular;  Laterality: N/A;   POLYPECTOMY  06/24/2021   Procedure: POLYPECTOMY;  Surgeon: Rollin Dover, MD;  Location: [facility] ENDOSCOPY;  Service: Endoscopy;;   RADIOFREQUENCY ABLATION  2005   for PSVT   TEE WITHOUT CARDIOVERSION N/A 07/27/2015   Procedure: TRANSESOPHAGEAL ECHOCARDIOGRAM (TEE);  Surgeon: Redell GORMAN Shallow, MD;  Location: [facility] ENDOSCOPY;  Service: Cardiovascular;  Laterality: N/A;   TEE WITHOUT CARDIOVERSION N/A 09/15/2015   Procedure: TRANSESOPHAGEAL ECHOCARDIOGRAM (TEE);  Surgeon: Aleene JINNY Passe, MD;  Location: [facility] ENDOSCOPY;  Service: Cardiovascular;  Laterality: N/A;     A IV Location/Drains/Wounds Patient Lines/Drains/Airways Status     Active Line/Drains/Airways     Name Placement date Placement time Site Days   Peripheral IV 06/07/23 20 G Right Antecubital 06/07/23  1913  Antecubital  1    Urethral Catheter Megan RN Straight-tip 18 Fr. 06/08/23  0531  Straight-tip  less than 1            Intake/Output Last 24 hours  Intake/Output Summary (Last 24 hours) at 06/08/2023 1314 Last data filed at 06/07/2023 2241 Gross per 24 hour  Intake --  Output 600 ml  Net -600 ml    Labs/Imaging Results for orders placed or performed during the hospital encounter of 06/07/23 (from the past 48 hours)  Urinalysis, Routine w reflex microscopic -Urine, Catheterized     Status: Abnormal   Collection Time: 06/07/23  7:10 PM  Result Value Ref Range   Color, Urine AMBER (A) YELLOW    Comment: BIOCHEMICALS MAY BE AFFECTED BY COLOR   APPearance CLOUDY (A) CLEAR   Specific Gravity, Urine  1.005 - 1.030    TEST NOT REPORTED DUE TO COLOR INTERFERENCE OF URINE PIGMENT   pH  5.0 - 8.0    TEST NOT REPORTED DUE TO COLOR INTERFERENCE OF URINE PIGMENT   Glucose, UA (A) NEGATIVE mg/dL    TEST NOT REPORTED DUE TO  COLOR INTERFERENCE OF URINE PIGMENT   Hgb urine dipstick (A) NEGATIVE    TEST NOT REPORTED DUE TO COLOR INTERFERENCE OF URINE PIGMENT   Bilirubin Urine (A) NEGATIVE    TEST NOT REPORTED DUE TO COLOR INTERFERENCE OF URINE PIGMENT   Ketones, ur (A) NEGATIVE mg/dL    TEST NOT REPORTED DUE TO COLOR INTERFERENCE OF URINE PIGMENT   Protein, ur (A) NEGATIVE mg/dL    TEST NOT REPORTED DUE TO COLOR INTERFERENCE OF URINE PIGMENT   Nitrite (A) NEGATIVE    TEST NOT REPORTED DUE TO COLOR INTERFERENCE OF URINE PIGMENT   Leukocytes,Ua (A) NEGATIVE    TEST NOT REPORTED DUE TO COLOR INTERFERENCE OF URINE PIGMENT    Comment: Performed at [facility], [address] [place], [place] [address]  CBC     Status: Abnormal   Collection Time: 06/07/23  7:10 PM  Result Value Ref Range   WBC 9.1 4.0 - 10.5 K/uL   RBC 4.19 (L) 4.22 - 5.81 MIL/uL   Hemoglobin 13.5 13.0 - 17.0 g/dL   HCT 59.1 60.9 - 47.9 %   MCV 97.4 80.0 - 100.0 fL   MCH 32.2 26.0 - 34.0 pg   MCHC 33.1 30.0 - 36.0 g/dL   RDW 86.2 88.4  - 84.4 %   Platelets 137 (L) 150 - 400 K/uL   nRBC 0.0 0.0 - 0.2 %    Comment: Performed at [facility], [address] [place], [place] [address]  Basic metabolic panel     Status: Abnormal   Collection Time: 06/07/23  7:10 PM  Result Value Ref Range   Sodium 139 135 - 145 mmol/L   Potassium 4.2 3.5 - 5.1 mmol/L    Comment: HHML HEMOLYSIS AT THIS LEVEL MAY AFFECT RESULT    Chloride 109 98 - 111 mmol/L   CO2 19 (L) 22 - 32 mmol/L   Glucose, Bld 159 (H) 70 - 99 mg/dL    Comment: Glucose reference range applies only to samples taken after fasting for at least 8 hours.   BUN 15 8 - 23 mg/dL   Creatinine, Ser 8.60 (H) 0.61 - 1.24 mg/dL   Calcium  9.3 8.9 - 10.3 mg/dL   GFR, Estimated 56 (L) >60 mL/min    Comment: (NOTE) Calculated using the CKD-EPI Creatinine Equation (2021)    Anion gap 11 5 - 15    Comment: Performed at [facility], [address] [place], [place] [address]  Urinalysis, Microscopic (reflex)     Status: None   Collection Time: 06/07/23  7:10 PM  Result Value Ref Range   RBC / HPF >50 0 - 5 RBC/hpf   WBC, UA 11-20 0 - 5 WBC/hpf   Bacteria, UA NONE SEEN NONE SEEN   Squamous Epithelial / HPF 0-5 0 - 5 /HPF    Comment: Performed at [facility], [address] [place], [place] [address]   *Note: Due to a large number of results and/or encounters for the requested time period, some results have not been displayed. A complete set of results can be found in Results Review.   No results found.  Pending Labs Unresulted Labs (From admission, onward)    None       Vitals/Pain Today's Vitals   06/08/23 0224 06/08/23 0702 06/08/23 0856 06/08/23 1145  BP: 124/79 138/76  103/87  Pulse: 79 63    Resp: 20 20    Temp: 98.1 F (36.7 C)  98.4 F (36.9 C)    TempSrc: Oral Oral    SpO2: 100% 100%    Weight:      Height:      PainSc:   5      Isolation Precautions No active isolations  Medications Medications  oxyCODONE -acetaminophen   (PERCOCET/ROXICET) 5-325 MG per tablet 1 tablet (1 tablet Oral Given 06/08/23 0756)    Mobility walks with person assist     Focused Assessments Pt is here for hematuria currently getting bladder irrigation    R Recommendations: See Admitting Provider Note  Report given to:   Additional Notes:

## 2023-06-08 NOTE — Progress Notes (Signed)
   06/08/23 2141  BiPAP/CPAP/SIPAP  BiPAP/CPAP/SIPAP Pt Type Adult  Reason BIPAP/CPAP not in use Non-compliant

## 2023-06-09 DIAGNOSIS — N320 Bladder-neck obstruction: Secondary | ICD-10-CM | POA: Diagnosis not present

## 2023-06-09 LAB — CBC
HCT: 34.9 % — ABNORMAL LOW (ref 39.0–52.0)
Hemoglobin: 11.8 g/dL — ABNORMAL LOW (ref 13.0–17.0)
MCH: 32.2 pg (ref 26.0–34.0)
MCHC: 33.8 g/dL (ref 30.0–36.0)
MCV: 95.1 fL (ref 80.0–100.0)
Platelets: 88 10*3/uL — ABNORMAL LOW (ref 150–400)
RBC: 3.67 MIL/uL — ABNORMAL LOW (ref 4.22–5.81)
RDW: 13.9 % (ref 11.5–15.5)
WBC: 9.1 10*3/uL (ref 4.0–10.5)
nRBC: 0 % (ref 0.0–0.2)

## 2023-06-09 LAB — BASIC METABOLIC PANEL
Anion gap: 9 (ref 5–15)
BUN: 13 mg/dL (ref 8–23)
CO2: 20 mmol/L — ABNORMAL LOW (ref 22–32)
Calcium: 8.7 mg/dL — ABNORMAL LOW (ref 8.9–10.3)
Chloride: 108 mmol/L (ref 98–111)
Creatinine, Ser: 1.21 mg/dL (ref 0.61–1.24)
GFR, Estimated: >60 mL/min (ref >60)
Glucose, Bld: 143 mg/dL — ABNORMAL HIGH (ref 70–99)
Potassium: 3.1 mmol/L — ABNORMAL LOW (ref 3.5–5.1)
Sodium: 137 mmol/L (ref 135–145)

## 2023-06-09 MED ORDER — SODIUM CHLORIDE 0.9 % IR SOLN
3000.0000 mL | Status: DC
  Administered 2023-06-09: 3000 mL

## 2023-06-09 MED ORDER — POTASSIUM CHLORIDE CRYS ER 20 MEQ PO TBCR
60.0000 meq | EXTENDED_RELEASE_TABLET | Freq: Once | ORAL | Status: AC
  Administered 2023-06-09: 60 meq via ORAL
  Filled 2023-06-09: qty 3

## 2023-06-09 MED ORDER — PHENAZOPYRIDINE HCL 100 MG PO TABS
100.0000 mg | ORAL_TABLET | Freq: Three times a day (TID) | ORAL | Status: DC
  Administered 2023-06-09 – 2023-06-10 (×3): 100 mg via ORAL
  Filled 2023-06-09 (×4): qty 1

## 2023-06-09 NOTE — Progress Notes (Signed)
 PROGRESS NOTE  Peter Dunlap  FMW:[id number] DOB: [birth year] DOA: 06/07/2023 PCP: Shelda Atlas, MD   Brief Narrative: Patient is a 68 year old male with history of hypertension, hyperlipidemia, tachybradycardia syndrome status post pacemaker placement, paroxysmal A-fib, coronary artery disease, history of CNS bleed, morbid obesity who presented with complaint of blood in the urine.  He was seen at [facility] about 2 days ago when he presented with difficulty with urination, Foley was placed  resulting in hematuria.  Catheter was irrigated, eventually discharged to home with plan for urology follow-up.  At home, catheter was not draining and blood clots were seen so he presented here.  Urology consulted on admission.  Found to have distal urethral stricture which was dilated, put on continuous bladder irrigation.  Currently urine is clearing up.  Patient is also on ceftriaxone  for suspicion of UTI.  Urology recommend outpatient follow-up.  Patient does not feel ready to go home today.  Complains of dysuria.  Possible plan for discharge tomorrow  Assessment & Plan:  Principal Problem:   Bladder outlet obstruction Active Problems:   Gross hematuria   Renal insufficiency   Essential hypertension   Type 2 diabetes mellitus with neurological complications (HCC)   Thrombocytopenia (HCC)   Tachy-brady syndrome (HCC)   Paroxysmal atrial fibrillation (HCC)   Hypothyroidism following radioiodine therapy   Mixed hyperlipidemia   History of prostate cancer   GERD (gastroesophageal reflux disease)   OSA (obstructive sleep apnea)   Obesity (BMI 30-39.9)  Hematuria/bladder outlet obstruction:   Initially seen in [facility], underwent traumatic Foley placement resulting in hematuria.  Catheter was not working at home so he came back here.  Found to have distal urethral stricture which was dilated, put on continuous bladder irrigation.  Urine is clearing up.  But complains of dysuria.  Urology  thinks that his hematuria could have been contributed by radiation cystitis as well. Urology recommended outpatient follow-up with Dr. Matilda  Suspected UTI: UA showed 11-20 WBC count.  Urine culture pending.  Currently on ceftriaxone . Afebrile, no leukocytosis  Hypertension: Currently blood pressure stable.  On sotalol , isosorbide   AKI: Resolved  Diabetes type 2: Last A1c in the range of 6.  Continue Farxiga .  On metformin  at home.  Resume  on discharge  Thrombocytopenia: Likely chronic, stable  Paroxysmal A-fib/tachybradycardia syndrome: Status post pacemaker placement.    He is status post Watchman procedure.  On sotalol .  Follows with Dr. Waddell  Hypertension: Continue levothyroxine   Hypokalemia: Supplemented potassium  Hyperlipidemia: On Crestor   History of BPH/prostate cancer: [facility]s [facility] as well as Dr. Rogers, oncology.  GERD: Continue Protonix   OSA: On CPAP at night  Obesity: BMI of 39          DVT prophylaxis:SCDs Start: 06/08/23 1311     Code Status: Full Code  Family Communication: Family member at bedside  Patient status:Obs  Patient is from :Home  Anticipated discharge un:Ynfz  Estimated DC date:tomorrow   Consultants: Urology  Procedures: CBI  Antimicrobials:  Anti-infectives (From admission, onward)    Start     Dose/Rate Route Frequency Ordered Stop   06/08/23 1915  cefTRIAXone  (ROCEPHIN ) 2 g in sodium chloride  0.9 % 100 mL IVPB        2 g 200 mL/hr over 30 Minutes Intravenous Every 24 hours 06/08/23 1825         Subjective: Patient seen and examined at the bedside today.  He looks comfortable, was lying on the bed.  CBI was  discontinued.  He has a leg bag which has urine, urine color, no signs of blood.  We discussed about discharge planning .  Patient states she has severe burning of urine  and does not feel comfortable to go home today.  He requested for going home tomorrow.  Objective: Vitals:    06/08/23 1535 06/08/23 2057 06/09/23 0517 06/09/23 0853  BP: 128/63 108/61 99/60 (!) 94/50  Pulse: 67 65 66 63  Resp: 18 20 20    Temp: 98 F (36.7 C) 98.6 F (37 C) 98.3 F (36.8 C) 98.2 F (36.8 C)  TempSrc: Oral Oral Oral Oral  SpO2: 100% 99% 98% 100%  Weight: 130.6 kg     Height: 6' (1.829 m)       Intake/Output Summary (Last 24 hours) at 06/09/2023 1403 Last data filed at 06/09/2023 1248 Gross per 24 hour  Intake 480 ml  Output 3950 ml  Net -3470 ml   Filed Weights   06/07/23 1908 06/08/23 1535  Weight: 130.6 kg 130.6 kg    Examination:  General exam: Overall comfortable, not in distress, morbidly obese HEENT: PERRL Respiratory system:  no wheezes or crackles  Cardiovascular system: S1 & S2 heard, RRR.  Gastrointestinal system: Abdomen is nondistended, soft and nontender. Central nervous system: Alert and oriented Extremities: No edema, no clubbing ,no cyanosis Skin: No rashes, no ulcers,no icterus   GU: Foley, leg bag   Data Reviewed: I have personally reviewed following labs and imaging studies  CBC: Recent Labs  Lab 06/06/23 1758 06/07/23 1910 06/08/23 1549 06/08/23 1852 06/09/23 0543  WBC 6.0 9.1  --   --  9.1  NEUTROABS 3.3  --   --   --   --   HGB 13.0 13.5 13.2 12.9* 11.8*  HCT 38.2* 40.8 39.4 38.0* 34.9*  MCV 96.7 97.4  --   --  95.1  PLT 116* 137*  --   --  88*   Basic Metabolic Panel: Recent Labs  Lab 06/06/23 1758 06/07/23 1910 06/09/23 0543  NA 136 139 137  K 3.5 4.2 3.1*  CL 108 109 108  CO2 20* 19* 20*  GLUCOSE 141* 159* 143*  BUN 14 15 13   CREATININE 1.21 1.39* 1.21  CALCIUM  9.0 9.3 8.7*     Recent Results (from the past 240 hours)  Urine Culture     Status: None   Collection Time: 06/06/23  9:16 PM   Specimen: Urine, Catheterized  Result Value Ref Range Status   Specimen Description   Final    URINE, CATHETERIZED Performed at [facility], [address], [place], [place] [address]    Special Requests   Final     NONE Performed at [facility], [address], [place], [place] [address]    Culture   Final    NO GROWTH Performed at [facility], [address] [place], [place] [address]    Report Status 06/08/2023 FINAL  Final     Radiology Studies: No results found.  Scheduled Meds:  allopurinol   100 mg Oral Daily   apalutamide   240 mg Oral Daily   Chlorhexidine  Gluconate Cloth  6 each Topical Daily   dapagliflozin  propanediol  5 mg Oral Daily   fesoterodine   4 mg Oral Daily   isosorbide  mononitrate  30 mg Oral Daily   levothyroxine   200 mcg Oral Q0600   levothyroxine   25 mcg Oral Q0600   loratadine   10 mg Oral Daily   [START ON 06/11/2023] mirabegron   ER  25 mg Oral Q M,W,F   pantoprazole   40 mg Oral Daily   rosuvastatin   40 mg Oral QPM   sodium chloride  flush  3 mL Intravenous Q12H   sotalol   80 mg Oral BID   tamsulosin   0.4 mg Oral BID   Continuous Infusions:  cefTRIAXone  (ROCEPHIN )  IV 2 g (06/08/23 2239)   sodium chloride  irrigation       LOS: 0 days   Ivonne Mustache, MD [facility] P1/09/2023, 2:03 PM

## 2023-06-09 NOTE — Progress Notes (Signed)
 Capped 3 way foley per order. Changed foley bag to leg bag for possible d/c.

## 2023-06-09 NOTE — Plan of Care (Signed)

## 2023-06-09 NOTE — Care Management Obs Status (Signed)
 MEDICARE OBSERVATION STATUS NOTIFICATION   Patient Details  Name: Peter Dunlap MRN: [id number] Date of Birth: 04/13/1956   Medicare Observation Status Notification Given:  Yes    Ronny Bacon, RN 06/09/2023, 9:04 AM

## 2023-06-09 NOTE — Progress Notes (Signed)
 Patient ID: Peter Dunlap, male   DOB: 1955-07-26, 68 y.o.   MRN: [id number]    Subjective: No complaints this morning.  Objective: Vital signs in last 24 hours: Temp:  [98 F (36.7 C)-98.6 F (37 C)] 98.2 F (36.8 C) (01/04 0853) Pulse Rate:  [63-67] 63 (01/04 0853) Resp:  [18-20] 20 (01/04 0517) BP: (94-128)/(50-87) 94/50 (01/04 0853) SpO2:  [98 %-100 %] 100 % (01/04 0853) Weight:  [130.6 kg] 130.6 kg (01/03 1535)  Intake/Output from previous day: 01/03 0701 - 01/04 0700 In: 240 [P.O.:240] Out: 4400 [Urine:4400] Intake/Output this shift: Total I/O In: 240 [P.O.:240] Out: 800 [Urine:800]  Physical Exam:  General: Alert and oriented GU: Urine clear on minimal CBI gtt  Lab Results: Recent Labs    06/08/23 1549 06/08/23 1852 06/09/23 0543  HGB 13.2 12.9* 11.8*  HCT 39.4 38.0* 34.9*   BMET Recent Labs    06/07/23 1910 06/09/23 0543  NA 139 137  K 4.2 3.1*  CL 109 108  CO2 19* 20*  GLUCOSE 159* 143*  BUN 15 13  CREATININE 1.39* 1.21  CALCIUM  9.3 8.7*     Studies/Results: No results found.  Assessment/Plan: 1) Hematuria: Ok to stop CBI.  Will need outpatient f/u with Dr. Matilda at [facility] for further evaluation. 2) Urethral stricture: Will need to keep Foley upon discharge with plans for outpatient voiding trial.  [facility] for discharge later today if urine remains clear.   LOS: 0 days   Noretta Ferrara 06/09/2023, 9:57 AM

## 2023-06-10 DIAGNOSIS — N320 Bladder-neck obstruction: Secondary | ICD-10-CM | POA: Diagnosis not present

## 2023-06-10 DIAGNOSIS — I10 Essential (primary) hypertension: Secondary | ICD-10-CM

## 2023-06-10 DIAGNOSIS — D696 Thrombocytopenia, unspecified: Secondary | ICD-10-CM

## 2023-06-10 DIAGNOSIS — I480 Paroxysmal atrial fibrillation: Secondary | ICD-10-CM

## 2023-06-10 DIAGNOSIS — E1149 Type 2 diabetes mellitus with other diabetic neurological complication: Secondary | ICD-10-CM

## 2023-06-10 DIAGNOSIS — E669 Obesity, unspecified: Secondary | ICD-10-CM

## 2023-06-10 LAB — BASIC METABOLIC PANEL
Anion gap: 12 (ref 5–15)
BUN: 14 mg/dL (ref 8–23)
CO2: 17 mmol/L — ABNORMAL LOW (ref 22–32)
Calcium: 9.1 mg/dL (ref 8.9–10.3)
Chloride: 109 mmol/L (ref 98–111)
Creatinine, Ser: 1.25 mg/dL — ABNORMAL HIGH (ref 0.61–1.24)
GFR, Estimated: >60 mL/min (ref >60)
Glucose, Bld: 203 mg/dL — ABNORMAL HIGH (ref 70–99)
Potassium: 3.6 mmol/L (ref 3.5–5.1)
Sodium: 138 mmol/L (ref 135–145)

## 2023-06-10 LAB — CBC
HCT: 37.2 % — ABNORMAL LOW (ref 39.0–52.0)
Hemoglobin: 12.6 g/dL — ABNORMAL LOW (ref 13.0–17.0)
MCH: 32.4 pg (ref 26.0–34.0)
MCHC: 33.9 g/dL (ref 30.0–36.0)
MCV: 95.6 fL (ref 80.0–100.0)
Platelets: 115 10*3/uL — ABNORMAL LOW (ref 150–400)
RBC: 3.89 MIL/uL — ABNORMAL LOW (ref 4.22–5.81)
RDW: 13.8 % (ref 11.5–15.5)
WBC: 11 10*3/uL — ABNORMAL HIGH (ref 4.0–10.5)
nRBC: 0 % (ref 0.0–0.2)

## 2023-06-10 MED ORDER — PHENAZOPYRIDINE HCL 100 MG PO TABS: 100.0000 mg | ORAL_TABLET | Freq: Three times a day (TID) | ORAL | 0 refills | Status: AC

## 2023-06-10 NOTE — Plan of Care (Signed)

## 2023-06-10 NOTE — Discharge Summary (Signed)
 Physician Discharge Summary   Patient: Peter Dunlap MRN: [id number] DOB: 1956/05/11  Admit date:     06/07/2023  Discharge date: 06/10/2023  Discharge Physician: Elgie Butter   PCP: Shelda Atlas, MD   Recommendations at discharge:  Please follow up with Urology as recommended.  Please follow up with PCP and bMP in one week.   Discharge Diagnoses: Principal Problem:   Bladder outlet obstruction Active Problems:   Gross hematuria   Renal insufficiency   Essential hypertension   Type 2 diabetes mellitus with neurological complications (HCC)   Thrombocytopenia (HCC)   Tachy-brady syndrome (HCC)   Paroxysmal atrial fibrillation (HCC)   Hypothyroidism following radioiodine therapy   Mixed hyperlipidemia   History of prostate cancer   GERD (gastroesophageal reflux disease)   OSA (obstructive sleep apnea)   Obesity (BMI 30-39.9)  Resolved Problems:   * No resolved hospital problems. *  Hospital Course: 68 year old male with history of hypertension, hyperlipidemia, tachybradycardia syndrome status post pacemaker placement, paroxysmal A-fib, coronary artery disease, history of CNS bleed, morbid obesity who presented with complaint of blood in the urine.  He was seen at [facility] about 2 days ago when he presented with difficulty with urination, Foley was placed  resulting in hematuria.  Catheter was irrigated, eventually discharged to home with plan for urology follow-up.  At home, catheter was not draining and blood clots were seen so he presented here.  Urology consulted on admission.  Found to have distal urethral stricture which was dilated, put on continuous bladder irrigation.  Currently urine is clearing up.  Patient is also on ceftriaxone  for suspicion of UTI.  Urology recommend outpatient follow-up.     Assessment and Plan:   Hematuria/bladder outlet obstruction:   Initially seen in [facility], underwent traumatic Foley placement resulting in hematuria.  Catheter  was not working at home so he came back here.  Found to have distal urethral stricture which was dilated, put on continuous bladder irrigation.  Urine is clearing up.  But complains of dysuria.  Urology thinks that his hematuria could have been contributed by radiation cystitis as well. Urology recommended outpatient follow-up with Dr. Matilda   Suspected UTI: UA showed 11-20 WBC count.  Urine culture  are negative.  Afebrile, no leukocytosis   Hypertension: Currently blood pressure stable.  On sotalol , isosorbide    AKI: Resolved   Diabetes type 2: Last A1c in the range of 6.  Continue Farxiga .  On metformin  at home.  Resume  on discharge   Thrombocytopenia: Likely chronic, stable   Paroxysmal A-fib/tachybradycardia syndrome: Status post pacemaker placement.    He is status post Watchman procedure.  On sotalol .  Follows with Dr. Waddell   Hypertension: Continue levothyroxine    Hypokalemia: Supplemented potassium   Hyperlipidemia: On Crestor    History of BPH/prostate cancer: Follows with [facility] as well as Dr. Rogers, oncology.   GERD: Continue Protonix    OSA: On CPAP at night   Obesity: BMI of 39    Consultants: Urology.  Procedures performed: CBI Disposition: Home Diet recommendation:  Discharge Diet Orders (From admission, onward)     Start     Ordered   06/10/23 0000  Diet - low sodium heart healthy        06/10/23 1228           Regular diet DISCHARGE MEDICATION: Allergies as of 06/10/2023       Reactions   Trazodone  And Nefazodone    Nightmares   Lactose Intolerance (  gi) Other (See Comments)   UPSET STOMACH        Medication List     PAUSE taking these medications    aspirin  EC 325 MG tablet Wait to take this until: June 17, 2023 Take 1 tablet (325 mg total) daily by mouth.       STOP taking these medications    metFORMIN  500 MG tablet Commonly known as: GLUCOPHAGE        TAKE these medications    acetaminophen  500 MG  tablet Commonly known as: TYLENOL  Take 500-1,000 mg by mouth every 6 (six) hours as needed for moderate pain (pain score 4-6).   allopurinol  100 MG tablet Commonly known as: ZYLOPRIM  TAKE 1 TABLET(100 MG) BY MOUTH DAILY   diclofenac  Sodium 1 % Gel Commonly known as: VOLTAREN  Apply 2 g topically 2 (two) times daily as needed (Pain).   Erleada  60 MG tablet Generic drug: apalutamide  TAKE 4 TABLETS (240 MG TOTAL) BY MOUTH DAILY. MAY BE TAKEN WITH OR WITHOUT FOOD. SWALLOW TABLETS WHOLE.   Farxiga  5 MG Tabs tablet Generic drug: dapagliflozin  propanediol Take 1 tablet (5 mg total) by mouth daily.   fluticasone  50 MCG/ACT nasal spray Commonly known as: FLONASE  Place 2 sprays into both nostrils daily. What changed:  when to take this reasons to take this   Gemtesa 75 MG Tabs Generic drug: Vibegron Take 75 mg by mouth every Monday, Wednesday, and Friday.   isosorbide  mononitrate 30 MG 24 hr tablet Commonly known as: IMDUR  Take 1 tablet (30 mg total) by mouth daily.   levothyroxine  25 MCG tablet Commonly known as: SYNTHROID  Take 1 tablet (25 mcg total) by mouth See admin instructions. Takes along with 200 mcg to total 225 mcg daily   levothyroxine  200 MCG tablet Commonly known as: SYNTHROID  Take 1 tablet (200 mcg total) by mouth daily before breakfast.   loratadine  10 MG tablet Commonly known as: CLARITIN  TAKE 1 TABLET(10 MG) BY MOUTH DAILY AS NEEDED FOR ALLERGIES   naloxone 4 MG/0.1ML Liqd nasal spray kit Commonly known as: NARCAN Place 4 mg into the nose once as needed (accidental overdose).   nitroGLYCERIN  0.4 MG SL tablet Commonly known as: NITROSTAT  PLACE 1 TABLET UNDER THE TONGUE EVERY 5 MINUTES AS NEEDED FOR CHEST PAIN. CALL 911 AT THIRD DOSE IN 15 MINUTES   oxyCODONE -acetaminophen  10-325 MG tablet Commonly known as: PERCOCET Take 1 tablet by mouth every 8 (eight) hours as needed for pain.   pantoprazole  40 MG tablet Commonly known as: PROTONIX  TAKE 1  TABLET(40 MG) BY MOUTH DAILY   phenazopyridine  100 MG tablet Commonly known as: PYRIDIUM  Take 1 tablet (100 mg total) by mouth 3 (three) times daily with meals.   rosuvastatin  40 MG tablet Commonly known as: CRESTOR  Take 40 mg by mouth every evening.   silodosin  8 MG Caps capsule Commonly known as: RAPAFLO  TAKE 1 CAPSULE BY MOUTH TWICE DAILY   solifenacin  10 MG tablet Commonly known as: VESICARE  TAKE 1 TABLET(10 MG) BY MOUTH DAILY   sotalol  80 MG tablet Commonly known as: BETAPACE  Take 1 tablet (80 mg total) by mouth 2 (two) times daily.   Vitamin B 12 500 MCG Tabs Take 500 mcg by mouth in the morning.   Vitamin D  (Ergocalciferol ) 1.25 MG (50000 UNIT) Caps capsule Commonly known as: DRISDOL  TAKE 1 CAPSULE BY MOUTH EVERY 7 DAYS What changed: when to take this        Follow-up Information     Matilda Senior, MD. Schedule an appointment as  soon as possible for a visit in 1 week(s).   Specialty: Urology Why: Please call to make an appointment for about 1 week Contact information: [address] [place] [address] [phone]                Discharge Exam: Fredricka Weights   06/07/23 1908 06/08/23 1535  Weight: 130.6 kg 130.6 kg   General exam: Appears calm and comfortable  Respiratory system: Clear to auscultation. Respiratory effort normal. Cardiovascular system: S1 & S2 heard, RRR Gastrointestinal system: Abdomen is nondistended, soft and nontender.  Central nervous system: Alert and oriented. No focal neurological deficits. Extremities: Symmetric 5 x 5 power. Skin: No rashes,  Psychiatry:Mood & affect appropriate.    Condition at discharge: fair  The results of significant diagnostics from this hospitalization (including imaging, microbiology, ancillary and laboratory) are listed below for reference.   Imaging Studies: No results found.  Microbiology: Results for orders placed or performed during the hospital encounter of  06/06/23  Urine Culture     Status: None   Collection Time: 06/06/23  9:16 PM   Specimen: Urine, Catheterized  Result Value Ref Range Status   Specimen Description   Final    URINE, CATHETERIZED Performed at [facility], [address], [place], [place] [address]    Special Requests   Final    NONE Performed at [facility], [address] [place], [place] [address]    Culture   Final    NO GROWTH Performed at [facility], [address], [place], [place] [address]    Report Status 06/08/2023 FINAL  Final   *Note: Due to a large number of results and/or encounters for the requested time period, some results have not been displayed. A complete set of results can be found in Results Review.    Labs: CBC: Recent Labs  Lab 06/06/23 1758 06/07/23 1910 06/08/23 1549 06/08/23 1852 06/09/23 0543 06/10/23 1239  WBC 6.0 9.1  --   --  9.1 11.0*  NEUTROABS 3.3  --   --   --   --   --   HGB 13.0 13.5 13.2 12.9* 11.8* 12.6*  HCT 38.2* 40.8 39.4 38.0* 34.9* 37.2*  MCV 96.7 97.4  --   --  95.1 95.6  PLT 116* 137*  --   --  88* 115*   Basic Metabolic Panel: Recent Labs  Lab 06/06/23 1758 06/07/23 1910 06/09/23 0543 06/10/23 1239  NA 136 139 137 138  K 3.5 4.2 3.1* 3.6  CL 108 109 108 109  CO2 20* 19* 20* 17*  GLUCOSE 141* 159* 143* 203*  BUN 14 15 13 14   CREATININE 1.21 1.39* 1.21 1.25*  CALCIUM  9.0 9.3 8.7* 9.1   Liver Function Tests: No results for input(s): AST, ALT, ALKPHOS, BILITOT, PROT, ALBUMIN in the last 168 hours. CBG: No results for input(s): GLUCAP in the last 168 hours.  Discharge time spent: 40 minutes.   Signed: Elgie Butter, MD [facility] 06/10/2023

## 2023-06-11 ENCOUNTER — Ambulatory Visit (INDEPENDENT_AMBULATORY_CARE_PROVIDER_SITE_OTHER): Payer: 59

## 2023-06-11 DIAGNOSIS — M2142 Flat foot [pes planus] (acquired), left foot: Secondary | ICD-10-CM

## 2023-06-11 DIAGNOSIS — M2141 Flat foot [pes planus] (acquired), right foot: Secondary | ICD-10-CM | POA: Diagnosis not present

## 2023-06-11 DIAGNOSIS — E1149 Type 2 diabetes mellitus with other diabetic neurological complication: Secondary | ICD-10-CM

## 2023-06-11 DIAGNOSIS — M2010 Hallux valgus (acquired), unspecified foot: Secondary | ICD-10-CM | POA: Diagnosis not present

## 2023-06-11 NOTE — Progress Notes (Signed)

## 2023-06-11 NOTE — Progress Notes (Signed)
 History of Present Illness: Peter Dunlap is here today for follow-up of prostate cancer and BPH w/ LUTS:    Prostate cancer:    He underwent TRUS/Bx on 8.15.2017. At that time, PSA was 9.09. Prostatic volume was 25 cc. 10/12 cores came back positive for adenocarcinoma as follows: 1 core revealed GS 3+3 5 cores revealed GS 3+4 4 cores revealed GS 4+3   He completed IMRT, 40 sessions, on 1.10.2018   8.24.2021: Repeat TRUS/Bx for elevating PSA trend following definitive Rx. 1/12 cores-from the left mid lateral region-came back positive for adenocarcinoma.  Grading was difficult due to his prior radiation.     10.15.2021: -CT A/P  with newly enlarged left iliac lymph nodes measuring 1.1 x 0.9 cm.  Hepatic steatosis with early signs of cirrhosis.   04/12/2020 -F-18 PSMA PET scan on showed radiotracer activity associated with enlarged left external iliac lymph node 9 mm with SUV 7.6.  Small left common iliac lymph node 6 mm, SUV 8.6.  Left periaortic lymph node 5 mm, SUV 6.3.  Lymph node between IVC and aorta at the level of right renal hilum, SUV 8.6.  Lymph node deep to the IVC measuring 6 mm, SUV 5.4.  No skeletal metastasis.   11.17.2021: Began ADT with Firmagon  and Erleada .   7.5.2022: Continues Firmagon  and Erleada   Most recent PSA is less than 0.1.  Testosterone  level 5.   11.9.2022: PSA < 0.01. T level castrate.  Continues Firmagon  and Erleada    7.18.2023: PSA is 0.01, testosterone  level still castrate.  Continues on Erleada  and Firmagon .    11.21.2023: Most recent PSA in September less than 0.01, nadir level for him.  He did experience hematuria in September-passed a clot,   5.21.2024: PSA 2 months ago was still undetectable.  Still with urinary frequency, urgency and urgency incontinence as well as nocturia.  On Rapaflo  twice a day as well as Solifenacin  10 mg.   8.7.2024:  He was switched to leuprolide  45 mg from his monthly Firmagon  on the 18th of last month.  He is much more  comfortable with this.  Most recent PSA was undetectable.  At his last visit he was started on Gemtesa in addition to his Solifenacin .    11.11.2024: He follows regularly with Dr. Rogers.  He is on leuprolide  45 mg every 6 months, last given in July.  Also on apalutamide  and Prolia.  Most recent PSA 2 months ago is less than 0.1.  He is having no hot flashes.  He takes vitamin D  supplements.  He does get some exercise-walking on a regular basis.  No bony pain.     BPH   11.12.2024: He denies significant lower urinary tract symptoms.  He is having no blood in his urine.  He is on Solifenacin  and alfuzosin    1.7.2025: Work in today.  He was in the hospital recently for gross hematuria, on CBI.  He still has a catheter in place.  He is having bladder spasms, urine leaking around the catheter.  The urine was yellow when he left the hospital but is now more bloody.  He is off of his aspirin .  Prior to discharge, his hemoglobin was 12.6.  Baseline is approximately 13.       Past Medical History:  Diagnosis Date   Arteriosclerotic cardiovascular disease (ASCVD) 2005   catheterization in 10/2010:50% mid LAD, diffuse distal disease, circumflex irregularities, large dominant RCA with a 50% ostial, 70% distal, 60% posterolateral and 70% PDA; normal EF  Arthritis    Benign prostatic hypertrophy    Bilateral carpal tunnel syndrome 07/03/2018   Cerebrovascular disease 2010   R. carotid endarterectomy; Duplex in 10/2010-widely patent ICAs, subtotal left vertebral-not thought to be contributing to symptoms   Cervical spine disease    CT in 2012-advanced degeneration and spondylosis with moderate spinal stenosis at C3-C6   CHF (congestive heart failure) (HCC)    Depression    Diabetes mellitus without complication (HCC)    Erectile dysfunction    Family history of breast cancer    Family history of cancer of mouth    Family history of CML (chronic monocytic leukemia)    Family history of lung  cancer    Family history of ovarian cancer    Family history of stomach cancer    Gastroesophageal reflux disease    H/O hiatal hernia    H/O: substance abuse (HCC)    Cocaine, marijuana, alcohol.  Quit 2013.    Hyperlipidemia    Hypertension    Non-ST elevation myocardial infarction (NSTEMI), initial episode of care (HCC) 12/02/2013   DES LAD   Obesity    Prostate cancer (HCC)    Sleep apnea    CPAP   Tachy-brady syndrome (HCC)    a. 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 25, 2010    CEA   COLONOSCOPY WITH PROPOFOL  N/A 06/24/2021   Procedure: COLONOSCOPY WITH PROPOFOL ;  Surgeon: Rollin Dover, MD;  Location: [facility] ENDOSCOPY;  Service: Endoscopy;  Laterality: N/A;   CORONARY ANGIOPLASTY WITH STENT PLACEMENT  12/03/2013   LAD 90%-->0% W/ Promus Premier DES 3.0 mm x 16 mm, CFX OK, RCA 40%, EF 70-75%   LEFT ATRIAL APPENDAGE OCCLUSION N/A 08/05/2015   Procedure: LEFT ATRIAL APPENDAGE OCCLUSION;  Surgeon: Lynwood Rakers, MD;  Location: [facility] INVASIVE CV LAB;  Service: Cardiovascular;  Laterality: N/A;   LEFT HEART CATH AND CORONARY ANGIOGRAPHY N/A 09/13/2021   Procedure: LEFT HEART CATH AND CORONARY ANGIOGRAPHY;  Surgeon: Wendel Lurena POUR, MD;  Location: [facility] INVASIVE CV LAB;  Service: Cardiovascular;  Laterality: N/A;   LEFT HEART CATHETERIZATION WITH CORONARY ANGIOGRAM Left 12/03/2013   Procedure: LEFT HEART CATHETERIZATION WITH CORONARY ANGIOGRAM;  Surgeon: Alm LELON Clay, MD;  Location: [facility] CATH LAB;  Service: Cardiovascular;  Laterality: Left;   LEFT HEART CATHETERIZATION WITH CORONARY ANGIOGRAM N/A 01/26/2014   Procedure: LEFT  HEART CATHETERIZATION WITH CORONARY ANGIOGRAM;  Surgeon: Candyce GORMAN Reek, MD;  Location: [facility] CATH LAB;  Service: Cardiovascular;  Laterality: N/A;   LEFT HEART CATHETERIZATION WITH CORONARY ANGIOGRAM N/A 08/03/2014   Procedure: LEFT HEART CATHETERIZATION WITH CORONARY ANGIOGRAM;  Surgeon: Lonni JONETTA Cash, MD;  Location: [facility] CATH LAB;  Service: Cardiovascular;  Laterality: N/A;   PERCUTANEOUS CORONARY STENT INTERVENTION (PCI-S)  12/03/2013   Procedure: PERCUTANEOUS CORONARY STENT INTERVENTION (PCI-S);  Surgeon: Alm LELON Clay, MD;  Location: [facility] CATH LAB;  Service: Cardiovascular;;   PERMANENT PACEMAKER INSERTION N/A 09/18/2014   Procedure: PERMANENT PACEMAKER INSERTION;  Surgeon: Danelle LELON Birmingham, MD;  Location: [facility] CATH LAB;  Service: Cardiovascular;  Laterality:  N/A;   POLYPECTOMY  06/24/2021   Procedure: POLYPECTOMY;  Surgeon: Rollin Dover, MD;  Location: [facility] ENDOSCOPY;  Service: Endoscopy;;   RADIOFREQUENCY ABLATION  2005   for PSVT   TEE WITHOUT CARDIOVERSION N/A 07/27/2015   Procedure: TRANSESOPHAGEAL ECHOCARDIOGRAM (TEE);  Surgeon: Redell GORMAN Shallow, MD;  Location: [facility] ENDOSCOPY;  Service: Cardiovascular;  Laterality: N/A;   TEE WITHOUT CARDIOVERSION N/A 09/15/2015   Procedure: TRANSESOPHAGEAL ECHOCARDIOGRAM (TEE);  Surgeon: Aleene JINNY Passe, MD;  Location: [facility] ENDOSCOPY;  Service: Cardiovascular;  Laterality: N/A;    Home Medications:  Allergies as of 06/12/2023       Reactions   Trazodone  And Nefazodone    Nightmares   Lactose Intolerance (gi) Other (See Comments)   UPSET STOMACH        Medication List        Accurate as of June 11, 2023 11:18 AM. If you have any questions, ask your nurse or doctor.          PAUSE taking these medications    aspirin  EC 325 MG tablet Wait to take this until: June 17, 2023 Take 1 tablet (325 mg total) daily by mouth.       TAKE these medications    acetaminophen  500 MG tablet Commonly known as: TYLENOL  Take 500-1,000 mg by  mouth every 6 (six) hours as needed for moderate pain (pain score 4-6).   allopurinol  100 MG tablet Commonly known as: ZYLOPRIM  TAKE 1 TABLET(100 MG) BY MOUTH DAILY   diclofenac  Sodium 1 % Gel Commonly known as: VOLTAREN  Apply 2 g topically 2 (two) times daily as needed (Pain).   Erleada  60 MG tablet Generic drug: apalutamide  TAKE 4 TABLETS (240 MG TOTAL) BY MOUTH DAILY. MAY BE TAKEN WITH OR WITHOUT FOOD. SWALLOW TABLETS WHOLE.   Farxiga  5 MG Tabs tablet Generic drug: dapagliflozin  propanediol Take 1 tablet (5 mg total) by mouth daily.   fluticasone  50 MCG/ACT nasal spray Commonly known as: FLONASE  Place 2 sprays into both nostrils daily. What changed:  when to take this reasons to take this   Gemtesa 75 MG Tabs Generic drug: Vibegron Take 75 mg by mouth every Monday, Wednesday, and Friday.   isosorbide  mononitrate 30 MG 24 hr tablet Commonly known as: IMDUR  Take 1 tablet (30 mg total) by mouth daily.   levothyroxine  25 MCG tablet Commonly known as: SYNTHROID  Take 1 tablet (25 mcg total) by mouth See admin instructions. Takes along with 200 mcg to total 225 mcg daily   levothyroxine  200 MCG tablet Commonly known as: SYNTHROID  Take 1 tablet (200 mcg total) by mouth daily before breakfast.   loratadine  10 MG tablet Commonly known as: CLARITIN  TAKE 1 TABLET(10 MG) BY MOUTH DAILY AS NEEDED FOR ALLERGIES   naloxone 4 MG/0.1ML Liqd nasal spray kit Commonly known as: NARCAN Place 4 mg into the nose once as needed (accidental overdose).   nitroGLYCERIN  0.4 MG SL tablet Commonly known as: NITROSTAT  PLACE 1 TABLET UNDER THE TONGUE EVERY 5 MINUTES AS NEEDED FOR CHEST PAIN. CALL 911 AT THIRD DOSE IN 15 MINUTES   oxyCODONE -acetaminophen  10-325 MG tablet Commonly known as: PERCOCET Take 1 tablet by mouth every 8 (eight) hours as needed for pain.   pantoprazole  40 MG tablet Commonly known as: PROTONIX  TAKE 1 TABLET(40 MG) BY MOUTH DAILY   phenazopyridine  100 MG  tablet Commonly known as: PYRIDIUM  Take 1 tablet (100 mg total) by mouth 3 (three) times daily with meals.   rosuvastatin  40 MG tablet Commonly known as: CRESTOR   Take 40 mg by mouth every evening.   silodosin  8 MG Caps capsule Commonly known as: RAPAFLO  TAKE 1 CAPSULE BY MOUTH TWICE DAILY   solifenacin  10 MG tablet Commonly known as: VESICARE  TAKE 1 TABLET(10 MG) BY MOUTH DAILY   sotalol  80 MG tablet Commonly known as: BETAPACE  Take 1 tablet (80 mg total) by mouth 2 (two) times daily.   Vitamin B 12 500 MCG Tabs Take 500 mcg by mouth in the morning.   Vitamin D  (Ergocalciferol ) 1.25 MG (50000 UNIT) Caps capsule Commonly known as: DRISDOL  TAKE 1 CAPSULE BY MOUTH EVERY 7 DAYS What changed: when to take this        Allergies:  Allergies  Allergen Reactions   Trazodone  And Nefazodone     Nightmares   Lactose Intolerance (Gi) Other (See Comments)    UPSET STOMACH     Family History  Problem Relation Age of Onset   Hypertension Mother        Cerebrovascular disease   Diabetes Mother    Coronary artery disease Father 53   Diabetes type II Father    Hypertension Father    Heart attack Father    Diabetes Brother    Hypertension Brother    Diabetes Sister    Hypertension Sister    Heart attack Sister 24   Leukemia Sister 98       CML   Breast cancer Maternal Grandmother        dx 40s   Lung cancer Maternal Uncle        dx >50, smoker   Cancer Cousin        mouth cancer, dx 22s, no smoking/chew tobacco hx (maternal 1st cousin)   Ovarian cancer Cousin        dx <50 (maternal 1st cousin)   Stomach cancer Cousin        dx 26s (maternal 1st cousin)   Cancer Cousin        type unknown to pt, dx >50 (paternal 1st cousin)    Social History:  reports that he quit smoking about 10 years ago. His smoking use included cigarettes. He started smoking about 50 years ago. He has a 40 pack-year smoking history. He has never used smokeless tobacco. He reports that he does  not currently use alcohol. He reports that he does not use drugs.  ROS: A complete review of systems was performed.  All systems are negative except for pertinent findings as noted.  Physical Exam:  Vital signs in last 24 hours: There were no vitals taken for this visit. Constitutional:  Alert and oriented, No acute distress Cardiovascular: Regular rate  Respiratory: Normal respiratory effort GU: Refused rectal exam Neurologic: Grossly intact, no focal deficits Psychiatric: Normal mood and affect  Bladder was irrigated with approximately 2 L of saline until clear, free of clots.  I have reviewed prior pt notes  I have reviewed notes from referring/previous physicians--Dr. Rogers  I have independently reviewed prior imaging--bone density study from June 2023 revealed osteopenia  I have reviewed prior PSA results    Impression/Assessment:  1.  Castrate sensitive metastatic prostate cancer, on leuprolide , apalutamide  with excellent PSA response that is durable  2.  Osteopenia, on Prolia  3.  BPH with lower urinary tract symptoms, on silodosin  and Solifenacin , doing well  4.  Recent gross hematuria, most likely secondary to radiation cystitis, now with catheter in  Plan:  1.  He was given Cipro  to cover his intervention today  2.  Continue to stay off aspirin , drink plenty of fluids  3.  Nurse visit in 1 to 2 days to check on catheter/urine, possible TOVA  4.  He will need eventual cystoscopy and upper tract evaluation  5.  CBC checked today

## 2023-06-12 ENCOUNTER — Encounter: Payer: Self-pay | Admitting: Urology

## 2023-06-12 ENCOUNTER — Ambulatory Visit (INDEPENDENT_AMBULATORY_CARE_PROVIDER_SITE_OTHER): Payer: 59 | Admitting: Urology

## 2023-06-12 VITALS — BP 191/83 | HR 77

## 2023-06-12 DIAGNOSIS — Z79899 Other long term (current) drug therapy: Secondary | ICD-10-CM | POA: Diagnosis not present

## 2023-06-12 DIAGNOSIS — N401 Enlarged prostate with lower urinary tract symptoms: Secondary | ICD-10-CM

## 2023-06-12 DIAGNOSIS — C61 Malignant neoplasm of prostate: Secondary | ICD-10-CM

## 2023-06-12 DIAGNOSIS — R350 Frequency of micturition: Secondary | ICD-10-CM

## 2023-06-12 DIAGNOSIS — R310 Gross hematuria: Secondary | ICD-10-CM

## 2023-06-12 DIAGNOSIS — M8580 Other specified disorders of bone density and structure, unspecified site: Secondary | ICD-10-CM

## 2023-06-12 DIAGNOSIS — Z191 Hormone sensitive malignancy status: Secondary | ICD-10-CM

## 2023-06-12 MED ORDER — CIPROFLOXACIN HCL 500 MG PO TABS
500.0000 mg | ORAL_TABLET | Freq: Once | ORAL | Status: AC
  Administered 2023-06-12: 500 mg via ORAL

## 2023-06-12 NOTE — Progress Notes (Signed)
 Bladder Irrigation  Bladder Irrigation done per MD. Patient tolerated well.   Performed by: Gwendolyn Grant CMA  Additional notes/ Follow up: As scheduled

## 2023-06-12 NOTE — Addendum Note (Signed)
 Addended by: Sarajane Jews on: 06/12/2023 03:02 PM   Modules accepted: Orders

## 2023-06-13 ENCOUNTER — Ambulatory Visit (INDEPENDENT_AMBULATORY_CARE_PROVIDER_SITE_OTHER): Payer: 59

## 2023-06-13 ENCOUNTER — Telehealth: Payer: Self-pay

## 2023-06-13 DIAGNOSIS — Z8546 Personal history of malignant neoplasm of prostate: Secondary | ICD-10-CM

## 2023-06-13 NOTE — Telephone Encounter (Signed)
 Pt believes catheter is stopped up again. Pt states nothing is going into the cath and that he's experiencing pain again. We advised him to get here for a nurse visit asap Pt said he'll be here asap

## 2023-06-13 NOTE — Progress Notes (Signed)
 Bladder Irrigation  Patient is present today for a bladder irrigation due to catheter blood clots. Patient was cleaned and prepped in a sterile fashion. After several rounds of flushing with a 70mL [place] syringe through the catheter in place. Upon completion, the catheter was draining well and was reattached to the leg bag for drainage. Patient tolerated well.   Performed by: Exie CMA  Additional notes/ Follow up: As scheduled

## 2023-06-15 ENCOUNTER — Ambulatory Visit (INDEPENDENT_AMBULATORY_CARE_PROVIDER_SITE_OTHER): Payer: 59

## 2023-06-15 ENCOUNTER — Other Ambulatory Visit: Payer: Self-pay | Admitting: Urology

## 2023-06-15 DIAGNOSIS — C61 Malignant neoplasm of prostate: Secondary | ICD-10-CM

## 2023-06-15 DIAGNOSIS — N3040 Irradiation cystitis without hematuria: Secondary | ICD-10-CM

## 2023-06-15 DIAGNOSIS — Z192 Hormone resistant malignancy status: Secondary | ICD-10-CM

## 2023-06-15 DIAGNOSIS — R310 Gross hematuria: Secondary | ICD-10-CM | POA: Diagnosis not present

## 2023-06-15 DIAGNOSIS — N401 Enlarged prostate with lower urinary tract symptoms: Secondary | ICD-10-CM

## 2023-06-15 MED ORDER — FINASTERIDE 5 MG PO TABS: 5.0000 mg | ORAL_TABLET | Freq: Every day | ORAL | 2 refills | Status: DC

## 2023-06-15 NOTE — Progress Notes (Signed)
 Name: Peter Dunlap DOB: 04-Nov-1955 MRN: [id number]  History of Present Illness: Peter Dunlap is a 68 y.o. male who presents today for follow up visit at [facility] [employer].  - GU history: 1. Castrate sensitive metastatic prostate cancer. Refer to prior notes for history / treatment details. Followed by oncology (Peter Dunlap). 2. LUTS. - Taking Rapaflo  8 mg 2x/day, Solifenacin  10 mg daily, and Gemtesa 75 mg daily. 3. Radiation cystitis.  Recent history: > 06/06/2023: Seen at [facility] ER for urinary retention and gross hematuria with clots. Catheter placed.  > 06/07/2023 - 06/10/2023: Admitted at [facility] after presenting to ER because catheter was not draining. Found to have distal urethral stricture which was dilated to 16f at bedside with placement of 69f hematuria coude catheter. Put on continuous bladder irrigation.  > 06/12/2023: Seen by Peter Dunlap. - Reported bladder spasms with urine leakage around Foley catheter. The urine was yellow when he left the hospital but is now more bloody.  He is off of his aspirin .  Prior to discharge, his hemoglobin was 12.6.  Baseline is approximately 13. - Bladder was irrigated with approximately 2 L of saline until clear, free of clots.  - He will need eventual cystoscopy and upper tract evaluation.  > 06/13/2023: Urology nurse visit. Catheter irrigated due to being clogged off with blood clots.  > 06/15/2023: Urology nurse visit. Catheter irrigated; extensive clot burden noted. Proscar  (Finasteride ) 5 mg daily prescribed in hopes that may reduce his gross hematuria.  Today: He reports persistent gross hematuria; denies clots at this time and states his Foley catheter is draining well today. He denies flank pain, abdominal pain, fevers, nausea, or vomiting. Reports occasional weakness. Denies dizziness, confusion, shortness of breath, chest pain, excessive fatigue, falls.    Fall Screening: Do you usually have a device  to assist in your mobility? No   Medications: Current Outpatient Medications  Medication Sig Dispense Refill   acetaminophen  (TYLENOL ) 500 MG tablet Take 500-1,000 mg by mouth every 6 (six) hours as needed for moderate pain (pain score 4-6).     allopurinol  (ZYLOPRIM ) 100 MG tablet TAKE 1 TABLET(100 MG) BY MOUTH DAILY 90 tablet 0   aspirin  EC 325 MG tablet Take 1 tablet (325 mg total) daily by mouth. 30 tablet 0   Cyanocobalamin (VITAMIN B 12) 500 MCG TABS Take 500 mcg by mouth in the morning.     diclofenac  Sodium (VOLTAREN ) 1 % GEL Apply 2 g topically 2 (two) times daily as needed (Pain).     FARXIGA  5 MG TABS tablet Take 1 tablet (5 mg total) by mouth daily. 90 tablet 3   finasteride  (PROSCAR ) 5 MG tablet Take 1 tablet (5 mg total) by mouth daily. 30 tablet 2   fluticasone  (FLONASE ) 50 MCG/ACT nasal spray Place 2 sprays into both nostrils daily. (Patient taking differently: Place 2 sprays into both nostrils daily as needed for allergies or rhinitis.) 16 g 6   isosorbide  mononitrate (IMDUR ) 30 MG 24 hr tablet Take 1 tablet (30 mg total) by mouth daily. 90 tablet 3   levothyroxine  (SYNTHROID ) 200 MCG tablet Take 1 tablet (200 mcg total) by mouth daily before breakfast. 90 tablet 3   levothyroxine  (SYNTHROID ) 25 MCG tablet Take 1 tablet (25 mcg total) by mouth See admin instructions. Takes along with 200 mcg to total 225 mcg daily 90 tablet 3   loratadine  (CLARITIN ) 10 MG tablet TAKE 1 TABLET(10 MG) BY MOUTH DAILY AS NEEDED FOR ALLERGIES  30 tablet 2   naloxone (NARCAN) nasal spray 4 mg/0.1 mL Place 4 mg into the nose once as needed (accidental overdose).     nitroGLYCERIN  (NITROSTAT ) 0.4 MG SL tablet PLACE 1 TABLET UNDER THE TONGUE EVERY 5 MINUTES AS NEEDED FOR CHEST PAIN. CALL 911 AT THIRD DOSE IN 15 MINUTES 25 tablet 11   oxyCODONE -acetaminophen  (PERCOCET) 10-325 MG tablet Take 1 tablet by mouth every 8 (eight) hours as needed for pain. 90 tablet 0   pantoprazole  (PROTONIX ) 40 MG tablet TAKE 1  TABLET(40 MG) BY MOUTH DAILY 30 tablet 3   phenazopyridine  (PYRIDIUM ) 100 MG tablet Take 1 tablet (100 mg total) by mouth 3 (three) times daily with meals. 10 tablet 0   rosuvastatin  (CRESTOR ) 40 MG tablet Take 40 mg by mouth every evening.     silodosin  (RAPAFLO ) 8 MG CAPS capsule TAKE 1 CAPSULE BY MOUTH TWICE DAILY 180 capsule 3   solifenacin  (VESICARE ) 10 MG tablet TAKE 1 TABLET(10 MG) BY MOUTH DAILY 30 tablet 11   sotalol  (BETAPACE ) 80 MG tablet Take 1 tablet (80 mg total) by mouth 2 (two) times daily. 180 tablet 3   Vibegron (GEMTESA) 75 MG TABS Take 75 mg by mouth every Monday, Wednesday, and Friday.     Vitamin D , Ergocalciferol , (DRISDOL ) 1.25 MG (50000 UNIT) CAPS capsule TAKE 1 CAPSULE BY MOUTH EVERY 7 DAYS (Patient taking differently: Take 50,000 Units by mouth every 14 (fourteen) days.) 12 capsule 0   apalutamide  (ERLEADA ) 60 MG tablet TAKE 4 TABLETS (240 MG TOTAL) BY MOUTH DAILY. MAY BE TAKEN WITH OR WITHOUT FOOD. SWALLOW TABLETS WHOLE. 120 tablet 3   No current facility-administered medications for this visit.    Allergies: Allergies  Allergen Reactions   Trazodone  And Nefazodone     Nightmares   Lactose Intolerance (Gi) Other (See Comments)    UPSET STOMACH     Past Medical History:  Diagnosis Date   Arteriosclerotic cardiovascular disease (ASCVD) 2005   catheterization in 10/2010:50% mid LAD, diffuse distal disease, circumflex irregularities, large dominant RCA with a 50% ostial, 70% distal, 60% posterolateral and 70% PDA; normal EF   Arthritis    Benign prostatic hypertrophy    Bilateral carpal tunnel syndrome 07/03/2018   Cerebrovascular disease 2010   R. carotid endarterectomy; Duplex in 10/2010-widely patent ICAs, subtotal left vertebral-not thought to be contributing to symptoms   Cervical spine disease    CT in 2012-advanced degeneration and spondylosis with moderate spinal stenosis at C3-C6   CHF (congestive heart failure) (HCC)    Depression    Diabetes  mellitus without complication (HCC)    Erectile dysfunction    Family history of breast cancer    Family history of cancer of mouth    Family history of CML (chronic monocytic leukemia)    Family history of lung cancer    Family history of ovarian cancer    Family history of stomach cancer    Gastroesophageal reflux disease    H/O hiatal hernia    H/O: substance abuse (HCC)    Cocaine, marijuana, alcohol.  Quit 2013.    Hyperlipidemia    Hypertension    Non-ST elevation myocardial infarction (NSTEMI), initial episode of care [facility]) 12/02/2013   DES LAD   Obesity    Prostate cancer (HCC)    Sleep apnea    CPAP   Tachy-brady syndrome (HCC)    a. s/p STJ dual chamber PPM    Thyroid  disease    Tobacco abuse  Quit 2014   Ulnar neuropathy at elbow 07/03/2018   Bilateral   Past Surgical History:  Procedure Laterality Date   BRAIN SURGERY  2015   hematoma evacuation   BURR HOLE Right 04/13/2014   Procedure: SOLMON HERRLICH;  Surgeon: Victory DELENA Gunnels, MD;  Location: [facility] NEURO ORS;  Service: Neurosurgery;  Laterality: Right;   CAROTID ENDARTERECTOMY Right Feb. 25, 2010    CEA   COLONOSCOPY WITH PROPOFOL  N/A 06/24/2021   Procedure: COLONOSCOPY WITH PROPOFOL ;  Surgeon: Rollin Dover, MD;  Location: [facility] ENDOSCOPY;  Service: Endoscopy;  Laterality: N/A;   CORONARY ANGIOPLASTY WITH STENT PLACEMENT  12/03/2013   LAD 90%-->0% W/ Promus Premier DES 3.0 mm x 16 mm, CFX OK, RCA 40%, EF 70-75%   LEFT ATRIAL APPENDAGE OCCLUSION N/A 08/05/2015   Procedure: LEFT ATRIAL APPENDAGE OCCLUSION;  Surgeon: Lynwood Rakers, MD;  Location: [facility] INVASIVE CV LAB;  Service: Cardiovascular;  Laterality: N/A;   LEFT HEART CATH AND CORONARY ANGIOGRAPHY N/A 09/13/2021   Procedure: LEFT HEART CATH AND CORONARY ANGIOGRAPHY;  Surgeon: Wendel Lurena POUR, MD;  Location: [facility] INVASIVE CV LAB;  Service: Cardiovascular;  Laterality: N/A;   LEFT HEART CATHETERIZATION WITH CORONARY ANGIOGRAM Left 12/03/2013   Procedure: LEFT HEART  CATHETERIZATION WITH CORONARY ANGIOGRAM;  Surgeon: Alm LELON Clay, MD;  Location: [facility] CATH LAB;  Service: Cardiovascular;  Laterality: Left;   LEFT HEART CATHETERIZATION WITH CORONARY ANGIOGRAM N/A 01/26/2014   Procedure: LEFT HEART CATHETERIZATION WITH CORONARY ANGIOGRAM;  Surgeon: Candyce GORMAN Reek, MD;  Location: [facility] CATH LAB;  Service: Cardiovascular;  Laterality: N/A;   LEFT HEART CATHETERIZATION WITH CORONARY ANGIOGRAM N/A 08/03/2014   Procedure: LEFT HEART CATHETERIZATION WITH CORONARY ANGIOGRAM;  Surgeon: Lonni JONETTA Cash, MD;  Location: [facility] CATH LAB;  Service: Cardiovascular;  Laterality: N/A;   PERCUTANEOUS CORONARY STENT INTERVENTION (PCI-S)  12/03/2013   Procedure: PERCUTANEOUS CORONARY STENT INTERVENTION (PCI-S);  Surgeon: Alm LELON Clay, MD;  Location: [facility] CATH LAB;  Service: Cardiovascular;;   PERMANENT PACEMAKER INSERTION N/A 09/18/2014   Procedure: PERMANENT PACEMAKER INSERTION;  Surgeon: Danelle LELON Birmingham, MD;  Location: [facility] CATH LAB;  Service: Cardiovascular;  Laterality: N/A;   POLYPECTOMY  06/24/2021   Procedure: POLYPECTOMY;  Surgeon: Rollin Dover, MD;  Location: [facility] ENDOSCOPY;  Service: Endoscopy;;   RADIOFREQUENCY ABLATION  2005   for PSVT   TEE WITHOUT CARDIOVERSION N/A 07/27/2015   Procedure: TRANSESOPHAGEAL ECHOCARDIOGRAM (TEE);  Surgeon: Redell GORMAN Shallow, MD;  Location: [facility] ENDOSCOPY;  Service: Cardiovascular;  Laterality: N/A;   TEE WITHOUT CARDIOVERSION N/A 09/15/2015   Procedure: TRANSESOPHAGEAL ECHOCARDIOGRAM (TEE);  Surgeon: Aleene JINNY Passe, MD;  Location: [facility] ENDOSCOPY;  Service: Cardiovascular;  Laterality: N/A;   Family History  Problem Relation Age of Onset   Hypertension Mother        Cerebrovascular disease   Diabetes Mother    Coronary artery disease Father 38   Diabetes type II Father    Hypertension Father    Heart attack Father    Diabetes Brother    Hypertension Brother    Diabetes Sister    Hypertension Sister    Heart attack Sister 60   Leukemia  Sister 20       CML   Breast cancer Maternal Grandmother        dx 71s   Lung cancer Maternal Uncle        dx >50, smoker   Cancer Cousin        mouth cancer, dx 52s, no smoking/chew tobacco hx (maternal 1st cousin)  Ovarian cancer Cousin        dx <50 (maternal 1st cousin)   Stomach cancer Cousin        dx 38s (maternal 1st cousin)   Cancer Cousin        type unknown to pt, dx >50 (paternal 1st cousin)   Social History   Socioeconomic History   Marital status: Married    Spouse name: Not on file   Number of children: 0   Years of education: Not on file   Highest education level: Not on file  Occupational History   Occupation: Retired  Tobacco Use   Smoking status: Former    Current packs/day: 0.00    Average packs/day: 1 pack/day for 40.0 years (40.0 ttl pk-yrs)    Types: Cigarettes    Start date: 10/20/1972    Quit date: 10/10/2012    Years since quitting: 10.6   Smokeless tobacco: Never   Tobacco comments:    Quit in May.   Vaping Use   Vaping status: Never Used  Substance and Sexual Activity   Alcohol use: Not Currently    Comment: former drinker-- sober since 2013.    Drug use: No    Types: Cocaine    Comment: quit cocaine 10/2011   Sexual activity: Yes    Partners: Female  Other Topics Concern   Not on file  Social History Narrative   Lives in [place].   Social Drivers of Corporate Investment Banker Strain: Low Risk  (04/19/2020)   Overall Financial Resource Strain (CARDIA)    Difficulty of Paying Living Expenses: Not hard at all  Food Insecurity: No Food Insecurity (06/08/2023)   Hunger Vital Sign    Worried About Running Out of Food in the Last Year: Never true    Ran Out of Food in the Last Year: Never true  Transportation Needs: No Transportation Needs (06/08/2023)   PRAPARE - Administrator, Civil Service (Medical): No    Lack of Transportation (Non-Medical): No  Physical Activity: Inactive (04/19/2020)   Exercise Vital Sign    Days  of Exercise per Week: 0 days    Minutes of Exercise per Session: 0 min  Stress: No Stress Concern Present (04/19/2020)   [employer] of Occupational Health - Occupational Stress Questionnaire    Feeling of Stress : Not at all  Social Connections: Socially Integrated (06/08/2023)   Social Connection and Isolation Panel [NHANES]    Frequency of Communication with Friends and Family: More than three times a week    Frequency of Social Gatherings with Friends and Family: Three times a week    Attends Religious Services: More than 4 times per year    Active Member of Clubs or Organizations: Yes    Attends Banker Meetings: More than 4 times per year    Marital Status: Married  Catering Manager Violence: Not At Risk (06/08/2023)   Humiliation, Afraid, Rape, and Kick questionnaire    Fear of Current or Ex-Partner: No    Emotionally Abused: No    Physically Abused: No    Sexually Abused: No    Review of Systems Constitutional: Patient denies any unintentional weight loss or change in strength lntegumentary: Patient denies any rashes or pruritus Cardiovascular: Patient denies chest pain or syncope Respiratory: Patient denies shortness of breath Gastrointestinal: Patient denies nausea, vomiting, constipation, or diarrhea Musculoskeletal: Patient denies muscle cramps or weakness Neurologic: Patient denies convulsions or seizures Allergic/Immunologic: Patient denies recent allergic reaction(s) Hematologic/Lymphatic:  Patient denies bleeding tendencies Endocrine: Patient denies heat/cold intolerance  GU: As per HPI.  OBJECTIVE Vitals:   06/19/23 1352  BP: 135/68  Pulse: (!) 56  Temp: 98.4 F (36.9 C)   There is no height or weight on file to calculate BMI.  Physical Examination Constitutional: No obvious distress; patient is non-toxic appearing  Cardiovascular: No visible lower extremity edema.  Respiratory: The patient does not have audible wheezing/stridor;  respirations do not appear labored  Gastrointestinal: Abdomen non-distended Musculoskeletal: Normal ROM of UEs  Skin: No obvious rashes/open sores  Neurologic: CN 2-12 grossly intact Psychiatric: Answered questions appropriately with normal affect  Hematologic/Lymphatic/Immunologic: No obvious bruises or sites of spontaneous bleeding  GU: Catheter draining bloody urine; no clots.   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   Advised CT abdomen/pelvis (hematuria protocol) for further evaluation.   Peter Dunlap was consulted and spoke with patient during today's office visit. Patient is currently scheduled for office cystoscopy with Peter Dunlap on 07/24/2023 - will likely be canceled because Peter Dunlap is trying to get him in the OR for cystoscopy with bladder fulguration sooner than that.  Patient was advised to continue indwelling Foley catheter for now along with current medications. He was instructed to call Urology office if needing nurse visit for catheter irrigation due to clots.   Patient was advised to go to the ER if he becomes unable to urinate due to clot retention, start having symptoms of anemia (which were discussed), fever >100.5 F, new flank/abdominal pain, or any other significant concerning acute symptoms.  Pt verbalized understanding  and agreement. All questions were answered.  PLAN Advised the following: 1. Continue indwelling Foley catheter.  2. Continue Rapaflo  8 mg 2x/day, Solifenacin  10 mg daily, and Gemtesa 75 mg daily. 3. Return for to be determined per Peter Dunlap (OR).  Orders Placed This Encounter  Procedures   CT HEMATURIA WORKUP    Standing Status:   Future    Expiration Date:   06/18/2024    Reason for Exam (SYMPTOM  OR DIAGNOSIS REQUIRED):   Gross hematuria    Preferred imaging location?:   [facility]   CBC   Basic metabolic panel   Total time spent caring for the patient today was over 30 minutes. This includes time spent on the date of the visit reviewing the patient's chart before the visit, time spent during the visit, and time spent after the visit on documentation. Over 50% of that time was spent in face-to-face time with this patient for direct counseling. E&M based on time and complexity of medical decision making.  It has been explained that the patient is to follow regularly with their PCP in addition to all other providers involved in their care and to follow instructions provided by these respective offices. Patient advised to contact urology clinic if any urologic-pertaining questions, concerns, new symptoms or problems arise in the interim period.  There are  no Patient Instructions on file for this visit.  Electronically signed by:  Lauraine JAYSON Oz, FNP   06/19/23    3:08 PM

## 2023-06-15 NOTE — Progress Notes (Addendum)
 Bladder Irrigation  Due to gross hematuria patient is present today for a bladder irrigation. Patient was cleaned and prepped in a sterile fashion. 2000 mL of saline/sterile water  was instilled into the bladder with a 70mL Toomey syringe through the catheter in place.  of urine return was cleared from the bladder. Upon completion, the catheter was draining well and was reattached to the leg bag for drainage. Patient tolerated well.   Performed by: Veleria, CMA  Additional notes/ Follow up: patient scheduled with Lauraine Oz, FNP on 06/19/2023.  Patient was provided with catheter irrigation supplies and educated on how to flush catheter.

## 2023-06-18 ENCOUNTER — Telehealth: Payer: Self-pay

## 2023-06-18 NOTE — Telephone Encounter (Signed)
-----   Message from Donnita Falls sent at 06/15/2023  1:54 PM EST ----- Please let pt know I have sent in rx for Proscar (Finasteride) 5 mg daily which he is advised to start today. May help reduce his gross hematuria. Thanks.

## 2023-06-18 NOTE — Telephone Encounter (Signed)
 Patient and Patient's wife was made aware of Sarah's recommendation. Patient/wife voiced understanding .

## 2023-06-19 ENCOUNTER — Other Ambulatory Visit: Payer: Self-pay | Admitting: Hematology

## 2023-06-19 ENCOUNTER — Other Ambulatory Visit: Payer: Self-pay

## 2023-06-19 ENCOUNTER — Ambulatory Visit (INDEPENDENT_AMBULATORY_CARE_PROVIDER_SITE_OTHER): Payer: 59 | Admitting: Urology

## 2023-06-19 ENCOUNTER — Encounter: Payer: Self-pay | Admitting: Urology

## 2023-06-19 VITALS — BP 135/68 | HR 56 | Temp 98.4°F

## 2023-06-19 DIAGNOSIS — Z8546 Personal history of malignant neoplasm of prostate: Secondary | ICD-10-CM | POA: Diagnosis not present

## 2023-06-19 DIAGNOSIS — N3040 Irradiation cystitis without hematuria: Secondary | ICD-10-CM

## 2023-06-19 DIAGNOSIS — Z192 Hormone resistant malignancy status: Secondary | ICD-10-CM

## 2023-06-19 DIAGNOSIS — Z191 Hormone sensitive malignancy status: Secondary | ICD-10-CM

## 2023-06-19 DIAGNOSIS — R310 Gross hematuria: Secondary | ICD-10-CM | POA: Diagnosis not present

## 2023-06-19 DIAGNOSIS — C61 Malignant neoplasm of prostate: Secondary | ICD-10-CM | POA: Diagnosis not present

## 2023-06-19 MED ORDER — APALUTAMIDE 60 MG PO TABS
ORAL_TABLET | ORAL | 3 refills | Status: DC
  Filled 2023-06-19: qty 120, 30d supply, fill #0
  Filled 2023-07-18: qty 120, 30d supply, fill #1
  Filled 2023-08-17: qty 120, 30d supply, fill #2
  Filled 2023-09-24: qty 120, 30d supply, fill #3

## 2023-06-19 NOTE — Progress Notes (Signed)
 Clinical Intervention Note  Clinical Intervention Notes: Patient's wife requested a call from [facility] regarding new medications from Urology. Called and LVM for a return call.   Clinical Intervention Outcomes: Prevention of an adverse drug event   Mitzie GORMAN [facility] Specialty Pharmacist

## 2023-06-19 NOTE — Progress Notes (Signed)
 Specialty Pharmacy Refill Coordination Note  Peter Dunlap is a 68 y.o. male contacted today regarding refills of specialty medication(s) Apalutamide  (ERLEADA ) Spoke with patient's wife Peter Dunlap  Patient requested Delivery   Delivery date: 06/28/23   Verified address: [name] [place], [place]   Medication will be filled on 01.22.25.

## 2023-06-20 ENCOUNTER — Encounter (HOSPITAL_BASED_OUTPATIENT_CLINIC_OR_DEPARTMENT_OTHER): Payer: Self-pay | Admitting: Urology

## 2023-06-20 ENCOUNTER — Encounter: Payer: Self-pay | Admitting: Internal Medicine

## 2023-06-20 ENCOUNTER — Other Ambulatory Visit: Payer: Self-pay

## 2023-06-20 ENCOUNTER — Telehealth: Payer: Self-pay

## 2023-06-20 NOTE — Progress Notes (Signed)
 PERIOPERATIVE PRESCRIPTION FOR IMPLANTED CARDIAC DEVICE PROGRAMMING  Patient Information: Name:  Peter Dunlap  DOB:  1955/08/08  MRN:  [id number]    Planned Procedure:  Cystoscopy w/ fulgeration and clot evacuation  Surgeon:  Dr. Joie Narrow  Date of Procedure:  06/21/2023  Cautery will be used.  Position during surgery:  lithotomy   Please send documentation back to:  [facility] (Fax # [phone])  Device Information:  Clinic EP Physician:  Manya Sells, MD   Device Type:  Pacemaker Manufacturer and Phone #:  St. Jude/Abbott: [phone] Pacemaker Dependent?:  No. Date of Last Device Check:  05/09/2023 Normal Device Function?:  Yes.    Electrophysiologist's Recommendations:  Have magnet available. Provide continuous ECG monitoring when magnet is used or reprogramming is to be performed.  Procedure should not interfere with device function.  No device programming or magnet placement needed.  Per Device Clinic Standing Orders, Glorianne Largo, RN  10:17 AM 06/20/2023

## 2023-06-20 NOTE — Progress Notes (Addendum)
 Spoke w/ via phone for pre-op interview---wife, Peter Dunlap Lab needs dos----ISTAT         Lab results------01/23/23 EKG in chart & Epic, 08/17/22 echo in Epic, 09/13/21 cardiac cath in Epic COVID test -----patient states asymptomatic no test needed Arrive at -------1015 on Thursday, 06/21/23 NPO after MN NO Solid Food.  Clear liquids from MN until---0915 Med rec completed Medications to take morning of surgery -----Finasteride , Allopurinol , Imdur , Levothyroxine , Protonix , Silodosin , Sotalol , Solifenacin ,  If needed may take: Claritin , Flonase , Percocet Diabetic medication -----Do not take Farxiga  on the morning of surgery. Patient's wife stated that his last dose of ASA 325mg  was on 06/19/23. Patient instructed no nail polish to be worn day of surgery Patient instructed to bring photo id and insurance card day of surgery Patient aware to have Driver (ride ) / caregiver    for 24 hours after surgery - Driver- niece, Quarry manager / Engineer, structural - wife, Peter Dunlap Patient Special Instructions -----Bring CPAP and supplies if you have them. Pre-Op special Instructions -----Patient ambulates with cane. Patient has dentures but does not wear them, per his wife. Patient verbalized understanding of instructions that were given at this phone interview. Patient denies chest pain, sob, fever, cough at the interview.   Patient has pacemaker device instructions dated 06/20/23 in Epic & chart.  He follows w/ cardiologist, Dr. Manya Sells @ [facility], LOV 01/23/23 in [place].  Patient has a history of metastatic prostate cancer, ASCVD status post multiple catherizations and a stent placement, permanent pacemaker, s/p right carotid endarterectomy in 2010,  HTN, HLP, DM2, NSTEMI 2015, OSA, PAF, CNS bleed, & s/p Watchman device. Patient was added to the surgery schedule at [facility] on 06/19/23. Due to patient's complex medical history, I reviewed this case with Dr. Lasalle Pointer, MDA on 06/20/23. Per Dr. Lasalle Pointer, MDA it is okay to proceed with this  case at [facility] without cardiac clearance.  Per patient's wife, his last dose of ASA 325 mg was on 06/19/23. I have sent a secure chat and left a voicemail for Arline Laity, RN for [facility] letting her know that the patient stopped ASA 325 mg on 06/19/23. Arline Laity, RN returned my message. Dr. Alda Hummer is aware and okay with patient's last dose of ASA being on 06/19/23.

## 2023-06-20 NOTE — Telephone Encounter (Addendum)
 Pt wife returning phone call said she wanted to know was there anything they needed to do before surgery tomorrow. Also stated if she doesn't answer please leave a VM

## 2023-06-21 ENCOUNTER — Ambulatory Visit (HOSPITAL_BASED_OUTPATIENT_CLINIC_OR_DEPARTMENT_OTHER)
Admission: RE | Admit: 2023-06-21 | Discharge: 2023-06-21 | Disposition: A | Discharge status: Home / Self Care | Payer: 59 | Attending: Urology | Admitting: Urology

## 2023-06-21 ENCOUNTER — Ambulatory Visit (HOSPITAL_BASED_OUTPATIENT_CLINIC_OR_DEPARTMENT_OTHER): Payer: 59 | Admitting: Anesthesiology

## 2023-06-21 ENCOUNTER — Other Ambulatory Visit: Payer: Self-pay

## 2023-06-21 ENCOUNTER — Encounter (HOSPITAL_BASED_OUTPATIENT_CLINIC_OR_DEPARTMENT_OTHER)
Admission: RE | Disposition: A | Discharge status: Home / Self Care | Payer: Self-pay | Source: Home / Self Care | Attending: Urology

## 2023-06-21 ENCOUNTER — Encounter (HOSPITAL_BASED_OUTPATIENT_CLINIC_OR_DEPARTMENT_OTHER): Payer: Self-pay | Admitting: Urology

## 2023-06-21 DIAGNOSIS — Z79899 Other long term (current) drug therapy: Secondary | ICD-10-CM | POA: Insufficient documentation

## 2023-06-21 DIAGNOSIS — E039 Hypothyroidism, unspecified: Secondary | ICD-10-CM

## 2023-06-21 DIAGNOSIS — I2510 Atherosclerotic heart disease of native coronary artery without angina pectoris: Secondary | ICD-10-CM | POA: Diagnosis not present

## 2023-06-21 DIAGNOSIS — Z7982 Long term (current) use of aspirin: Secondary | ICD-10-CM | POA: Insufficient documentation

## 2023-06-21 DIAGNOSIS — N3091 Cystitis, unspecified with hematuria: Secondary | ICD-10-CM | POA: Insufficient documentation

## 2023-06-21 DIAGNOSIS — I509 Heart failure, unspecified: Secondary | ICD-10-CM | POA: Insufficient documentation

## 2023-06-21 DIAGNOSIS — Z955 Presence of coronary angioplasty implant and graft: Secondary | ICD-10-CM | POA: Insufficient documentation

## 2023-06-21 DIAGNOSIS — R310 Gross hematuria: Secondary | ICD-10-CM

## 2023-06-21 DIAGNOSIS — I110 Hypertensive heart disease with heart failure: Secondary | ICD-10-CM | POA: Diagnosis not present

## 2023-06-21 DIAGNOSIS — Z87891 Personal history of nicotine dependence: Secondary | ICD-10-CM | POA: Diagnosis not present

## 2023-06-21 DIAGNOSIS — Z01818 Encounter for other preprocedural examination: Secondary | ICD-10-CM

## 2023-06-21 DIAGNOSIS — E119 Type 2 diabetes mellitus without complications: Secondary | ICD-10-CM

## 2023-06-21 DIAGNOSIS — C61 Malignant neoplasm of prostate: Secondary | ICD-10-CM | POA: Insufficient documentation

## 2023-06-21 DIAGNOSIS — I252 Old myocardial infarction: Secondary | ICD-10-CM | POA: Diagnosis not present

## 2023-06-21 DIAGNOSIS — Z923 Personal history of irradiation: Secondary | ICD-10-CM | POA: Insufficient documentation

## 2023-06-21 DIAGNOSIS — Z8551 Personal history of malignant neoplasm of bladder: Secondary | ICD-10-CM | POA: Diagnosis not present

## 2023-06-21 DIAGNOSIS — G4730 Sleep apnea, unspecified: Secondary | ICD-10-CM | POA: Insufficient documentation

## 2023-06-21 HISTORY — DX: Presence of spectacles and contact lenses: Z97.3

## 2023-06-21 HISTORY — DX: Dependence on other enabling machines and devices: Z99.89

## 2023-06-21 HISTORY — DX: Hypothyroidism, unspecified: E03.9

## 2023-06-21 HISTORY — DX: Anemia, unspecified: D64.9

## 2023-06-21 HISTORY — DX: Presence of dental prosthetic device (complete) (partial): Z97.2

## 2023-06-21 HISTORY — DX: Complete loss of teeth, unspecified cause, unspecified class: K08.109

## 2023-06-21 HISTORY — DX: Headache, unspecified: R51.9

## 2023-06-21 HISTORY — PX: CYSTOSCOPY WITH FULGERATION: SHX6638

## 2023-06-21 LAB — POCT I-STAT, CHEM 8
BUN: 9 mg/dL (ref 8–23)
Calcium, Ion: 1.06 mmol/L — ABNORMAL LOW (ref 1.15–1.40)
Chloride: 109 mmol/L (ref 98–111)
Creatinine, Ser: 1.1 mg/dL (ref 0.61–1.24)
Glucose, Bld: 156 mg/dL — ABNORMAL HIGH (ref 70–99)
HCT: 34 % — ABNORMAL LOW (ref 39.0–52.0)
Hemoglobin: 11.6 g/dL — ABNORMAL LOW (ref 13.0–17.0)
Potassium: 4.2 mmol/L (ref 3.5–5.1)
Sodium: 139 mmol/L (ref 135–145)
TCO2: 21 mmol/L — ABNORMAL LOW (ref 22–32)

## 2023-06-21 LAB — GLUCOSE, CAPILLARY: Glucose-Capillary: 128 mg/dL — ABNORMAL HIGH (ref 70–99)

## 2023-06-21 SURGERY — CYSTOSCOPY, WITH BLADDER FULGURATION
Anesthesia: General | Site: Bladder

## 2023-06-21 MED ORDER — PHENYLEPHRINE HCL-NACL 20-0.9 MG/250ML-% IV SOLN
INTRAVENOUS | Status: DC | PRN
  Administered 2023-06-21: 50 ug/min via INTRAVENOUS

## 2023-06-21 MED ORDER — LIDOCAINE 2% (20 MG/ML) 5 ML SYRINGE
INTRAMUSCULAR | Status: DC | PRN
  Administered 2023-06-21: 100 mg via INTRAVENOUS

## 2023-06-21 MED ORDER — STERILE WATER FOR IRRIGATION IR SOLN
Status: DC | PRN
  Administered 2023-06-21: 3000 mL

## 2023-06-21 MED ORDER — PROPOFOL 10 MG/ML IV BOLUS
INTRAVENOUS | Status: DC | PRN
  Administered 2023-06-21: 160 mg via INTRAVENOUS
  Administered 2023-06-21: 30 mg via INTRAVENOUS

## 2023-06-21 MED ORDER — AMISULPRIDE (ANTIEMETIC) 5 MG/2ML IV SOLN: 10.0000 mg | Freq: Once | INTRAVENOUS | Status: DC | PRN

## 2023-06-21 MED ORDER — ACETAMINOPHEN 500 MG PO TABS
1000.0000 mg | ORAL_TABLET | Freq: Once | ORAL | Status: AC
Start: 2023-06-21 — End: 2023-06-21
  Administered 2023-06-21: 1000 mg via ORAL

## 2023-06-21 MED ORDER — LIDOCAINE HCL (PF) 2 % IJ SOLN
INTRAMUSCULAR | Status: AC
  Filled 2023-06-21: qty 10

## 2023-06-21 MED ORDER — FENTANYL CITRATE (PF) 100 MCG/2ML IJ SOLN
INTRAMUSCULAR | Status: AC
  Filled 2023-06-21: qty 2

## 2023-06-21 MED ORDER — PHENYLEPHRINE 80 MCG/ML (10ML) SYRINGE FOR IV PUSH (FOR BLOOD PRESSURE SUPPORT)
PREFILLED_SYRINGE | INTRAVENOUS | Status: AC
  Filled 2023-06-21: qty 20

## 2023-06-21 MED ORDER — DEXAMETHASONE SODIUM PHOSPHATE 10 MG/ML IJ SOLN
INTRAMUSCULAR | Status: DC | PRN
  Administered 2023-06-21: 5 mg via INTRAVENOUS

## 2023-06-21 MED ORDER — CEPHALEXIN 500 MG PO CAPS: 500.0000 mg | ORAL_CAPSULE | Freq: Two times a day (BID) | ORAL | 0 refills | Status: AC

## 2023-06-21 MED ORDER — LACTATED RINGERS IV SOLN
INTRAVENOUS | Status: DC
  Administered 2023-06-21: 1000 mL via INTRAVENOUS

## 2023-06-21 MED ORDER — FENTANYL CITRATE (PF) 100 MCG/2ML IJ SOLN: 25.0000 ug | INTRAMUSCULAR | Status: DC | PRN

## 2023-06-21 MED ORDER — PROPOFOL 10 MG/ML IV BOLUS
INTRAVENOUS | Status: AC
  Filled 2023-06-21: qty 20

## 2023-06-21 MED ORDER — ONDANSETRON HCL 4 MG/2ML IJ SOLN
INTRAMUSCULAR | Status: AC
  Filled 2023-06-21: qty 2

## 2023-06-21 MED ORDER — ACETAMINOPHEN 500 MG PO TABS
ORAL_TABLET | ORAL | Status: AC
  Filled 2023-06-21: qty 2

## 2023-06-21 MED ORDER — FENTANYL CITRATE (PF) 100 MCG/2ML IJ SOLN
INTRAMUSCULAR | Status: DC | PRN
  Administered 2023-06-21: 75 ug via INTRAVENOUS
  Administered 2023-06-21: 25 ug via INTRAVENOUS

## 2023-06-21 MED ORDER — ONDANSETRON HCL 4 MG/2ML IJ SOLN: 4.0000 mg | Freq: Once | INTRAMUSCULAR | Status: DC | PRN

## 2023-06-21 MED ORDER — STERILE WATER FOR IRRIGATION IR SOLN
Status: DC | PRN
  Administered 2023-06-21: 1000 mL

## 2023-06-21 MED ORDER — DEXAMETHASONE SODIUM PHOSPHATE 10 MG/ML IJ SOLN
INTRAMUSCULAR | Status: AC
  Filled 2023-06-21: qty 1

## 2023-06-21 MED ORDER — PHENYLEPHRINE 80 MCG/ML (10ML) SYRINGE FOR IV PUSH (FOR BLOOD PRESSURE SUPPORT)
PREFILLED_SYRINGE | INTRAVENOUS | Status: DC | PRN
  Administered 2023-06-21 (×2): 80 ug via INTRAVENOUS

## 2023-06-21 MED ORDER — CEFAZOLIN SODIUM-DEXTROSE 2-4 GM/100ML-% IV SOLN
2.0000 g | INTRAVENOUS | Status: AC
  Administered 2023-06-21: 2 g via INTRAVENOUS

## 2023-06-21 MED ORDER — ONDANSETRON HCL 4 MG/2ML IJ SOLN
INTRAMUSCULAR | Status: DC | PRN
  Administered 2023-06-21: 4 mg via INTRAVENOUS

## 2023-06-21 MED ORDER — CEFAZOLIN SODIUM-DEXTROSE 2-4 GM/100ML-% IV SOLN
INTRAVENOUS | Status: AC
  Filled 2023-06-21: qty 100

## 2023-06-21 SURGICAL SUPPLY — 22 items
BAG DRAIN URO-CYSTO SKYTR STRL (DRAIN) ×1 IMPLANT
BAG URINE DRAIN 2000ML AR STRL (UROLOGICAL SUPPLIES) IMPLANT
BLANKET WARM UPPER BOD BAIR (MISCELLANEOUS) ×1 IMPLANT
CATH FOLEY 2WAY SLVR 5CC 20FR (CATHETERS) IMPLANT
CLOTH BEACON ORANGE TIMEOUT ST (SAFETY) ×1 IMPLANT
ELECT REM PT RETURN 9FT ADLT (ELECTROSURGICAL) ×1
ELECTRODE REM PT RTRN 9FT ADLT (ELECTROSURGICAL) ×1 IMPLANT
GLOVE BIO SURGEON STRL SZ8 (GLOVE) ×1 IMPLANT
GOWN STRL REUS W/TWL XL LVL3 (GOWN DISPOSABLE) ×2 IMPLANT
HOLDER FOLEY CATH W/STRAP (MISCELLANEOUS) IMPLANT
KIT TURNOVER CYSTO (KITS) ×1 IMPLANT
MANIFOLD NEPTUNE II (INSTRUMENTS) IMPLANT
NDL HYPO 22X1.5 SAFETY MO (MISCELLANEOUS) IMPLANT
NDL SAFETY ECLIPSE 18X1.5 (NEEDLE) IMPLANT
NEEDLE HYPO 22X1.5 SAFETY MO (MISCELLANEOUS)
NS IRRIG 500ML POUR BTL (IV SOLUTION) IMPLANT
PACK CYSTO (CUSTOM PROCEDURE TRAY) ×1 IMPLANT
SLEEVE SCD COMPRESS KNEE MED (STOCKING) ×1 IMPLANT
SYR 20ML LL LF (SYRINGE) IMPLANT
TUBE CONNECTING 12X1/4 (SUCTIONS) IMPLANT
WATER STERILE IRR 1000ML POUR (IV SOLUTION) IMPLANT
WATER STERILE IRR 3000ML UROMA (IV SOLUTION) ×1 IMPLANT

## 2023-06-21 NOTE — H&P (Signed)
H&P  Chief Complaint: Blood in urine  History of Present Illness: 68 year old male with recurrent prostate cancer following definitive radiotherapy in 2018.  He is now being treated for castrate sensitive metastatic prostate cancer with androgen deprivation therapy as well as apalutamide.  Over the past few weeks he has had persistent gross hematuria.  His CT scan is pending, but he presents at this time for persistent gross hematuria despite irrigation.  It turns out that he has been on aspirin, but with a 3-week history of hematuria he presents for cystoscopy, possible biopsies, fulguration of bleeders.  Past Medical History:  Diagnosis Date   Ambulates with cane    Anemia    history of iron infusions in 2024   Arteriosclerotic cardiovascular disease (ASCVD) 2005   catheterization in 10/2010:50% mid LAD, diffuse distal disease, circumflex irregularities, large dominant RCA with a 50% ostial, 70% distal, 60% posterolateral and 70% PDA; normal EF   Arthritis    Benign prostatic hypertrophy    Bilateral carpal tunnel syndrome 07/03/2018   Cerebrovascular disease 2010   R. carotid endarterectomy; Duplex in 10/2010-widely patent ICAs, subtotal left vertebral-not thought to be contributing to symptoms   Cervical spine disease    CT in 2012-advanced degeneration and spondylosis with moderate spinal stenosis at C3-C6   CHF (congestive heart failure) [facility])    Follows w/ cardiologist, Dr. Lewayne Bunting.   Depression    Diabetes mellitus without complication (HCC)    Takes [place].   Erectile dysfunction    Family history of breast cancer    Family history of cancer of mouth    Family history of CML (chronic monocytic leukemia)    Family history of lung cancer    Family history of ovarian cancer    Family history of stomach cancer    Full dentures    Wife states that he usually does not wear his dentures.   Gastroesophageal reflux disease    H/O hiatal hernia    H/O: substance abuse (HCC)     Cocaine, marijuana, alcohol.  Quit 2013.    Headache    Hyperlipidemia    Hypertension    Follows w/ cardiologist, Dr. Lewayne Bunting.   Hypothyroidism    Follows w/ Ronny Bacon, NP, endocrinology.   Non-ST elevation myocardial infarction (NSTEMI), initial episode of care [facility]) 12/02/2013   DES LAD   Obesity    Presence of permanent cardiac pacemaker    Prostate cancer (HCC)    Sleep apnea    CPAP   Tachy-brady syndrome (HCC)    a. 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 25, 2010    CEA   COLONOSCOPY WITH PROPOFOL N/A 06/24/2021   Procedure: COLONOSCOPY WITH PROPOFOL;  Surgeon: Jeani Hawking, MD;  Location: [facility] ENDOSCOPY;  Service: Endoscopy;  Laterality: N/A;   CORONARY ANGIOPLASTY WITH STENT PLACEMENT  12/03/2013   LAD 90%-->0% W/ Promus Premier DES 3.0 mm x 16 mm, CFX OK, RCA 40%, EF 70-75%   LEFT ATRIAL APPENDAGE OCCLUSION  N/A 08/05/2015   Procedure: LEFT ATRIAL APPENDAGE OCCLUSION;  Surgeon: [name], MD;  Location: [facility] INVASIVE CV LAB;  Service: Cardiovascular;  Laterality: N/A;   LEFT HEART CATH AND CORONARY ANGIOGRAPHY N/A 09/13/2021   Procedure: LEFT HEART CATH AND CORONARY ANGIOGRAPHY;  Surgeon: Orbie Pyo, MD;  Location: [facility] INVASIVE CV LAB;  Service: Cardiovascular;  Laterality: N/A;   LEFT HEART CATHETERIZATION WITH CORONARY ANGIOGRAM Left 12/03/2013   Procedure: LEFT HEART CATHETERIZATION WITH CORONARY ANGIOGRAM;  Surgeon: Marykay Lex, MD;  Location: [facility] CATH LAB;  Service: Cardiovascular;  Laterality: Left;   LEFT HEART CATHETERIZATION WITH  CORONARY ANGIOGRAM N/A 01/26/2014   Procedure: LEFT HEART CATHETERIZATION WITH CORONARY ANGIOGRAM;  Surgeon: Corky Crafts, MD;  Location: [facility] CATH LAB;  Service: Cardiovascular;  Laterality: N/A;   LEFT HEART CATHETERIZATION WITH CORONARY ANGIOGRAM N/A 08/03/2014   Procedure: LEFT HEART CATHETERIZATION WITH CORONARY ANGIOGRAM;  Surgeon: Kathleene Hazel, MD;  Location: [facility] CATH LAB;  Service: Cardiovascular;  Laterality: N/A;   PERCUTANEOUS CORONARY STENT INTERVENTION (PCI-S)  12/03/2013   Procedure: PERCUTANEOUS CORONARY STENT INTERVENTION (PCI-S);  Surgeon: Marykay Lex, MD;  Location: [facility] CATH LAB;  Service: Cardiovascular;;   PERMANENT PACEMAKER INSERTION N/A 09/18/2014   Procedure: PERMANENT PACEMAKER INSERTION;  Surgeon: Marinus Maw, MD;  Location: [facility] CATH LAB;  Service: Cardiovascular;  Laterality: N/A;   POLYPECTOMY  06/24/2021   Procedure: POLYPECTOMY;  Surgeon: Jeani Hawking, MD;  Location: [facility] ENDOSCOPY;  Service: Endoscopy;;   RADIOFREQUENCY ABLATION  2005   for PSVT   TEE WITHOUT CARDIOVERSION N/A 07/27/2015   Procedure: TRANSESOPHAGEAL ECHOCARDIOGRAM (TEE);  Surgeon: Lewayne Bunting, MD;  Location: [facility] ENDOSCOPY;  Service: Cardiovascular;  Laterality: N/A;   TEE WITHOUT CARDIOVERSION N/A 09/15/2015   Procedure: TRANSESOPHAGEAL ECHOCARDIOGRAM (TEE);  Surgeon: Vesta Mixer, MD;  Location: [facility] ENDOSCOPY;  Service: Cardiovascular;  Laterality: N/A;    Home Medications:    Allergies:  Allergies  Allergen Reactions   Lactose Intolerance (Gi) Other (See Comments)    UPSET STOMACH    Trazodone And Nefazodone     Nightmares    Family History  Problem Relation Age of Onset   Hypertension Mother        Cerebrovascular disease   Diabetes Mother    Coronary artery disease Father 20   Diabetes type II Father    Hypertension Father    Heart attack Father    Diabetes Brother    Hypertension Brother    Diabetes Sister    Hypertension Sister    Heart attack Sister 19    Leukemia Sister 45       CML   Breast cancer Maternal Grandmother        dx 43s   Lung cancer Maternal Uncle        dx >50, smoker   Cancer Cousin        mouth cancer, dx 26s, no smoking/chew tobacco hx (maternal 1st cousin)   Ovarian cancer Cousin        dx <50 (maternal 1st cousin)   Stomach cancer Cousin        dx 31s (maternal 1st cousin)   Cancer Cousin        type unknown to pt, dx >50 (paternal 1st cousin)    Social History:  reports that he quit smoking about 10 years ago. His smoking use included cigarettes. He started smoking about 50 years ago. He has a 40 pack-year smoking history. He has never used smokeless tobacco.  He reports that he does not currently use alcohol. He reports that he does not currently use drugs after having used the following drugs: Cocaine.  ROS: A complete review of systems was performed.  All systems are negative except for pertinent findings as noted.  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

## 2023-06-21 NOTE — Anesthesia Preprocedure Evaluation (Addendum)
Anesthesia Evaluation  Patient identified by MRN, date of birth, ID band Patient awake    Reviewed: Allergy & Precautions, NPO status , Patient's Chart, lab work & pertinent test results  Airway Mallampati: III  TM Distance: >3 FB Neck ROM: Full    Dental  (+) Edentulous Upper, Missing   Pulmonary sleep apnea , former smoker   Pulmonary exam normal        Cardiovascular hypertension, Pt. on home beta blockers + angina  + CAD, + Past MI, + Cardiac Stents and +CHF  Normal cardiovascular exam+ pacemaker   ECHO 1. Mild basal intracavitary gradient. Peak velocity 1. 35 m/ s. Peak gradient 7. 3 mmHg. Left ventricular ejection fraction, by estimation, is 65 to 70% . The left ventricle has normal function. The left ventricle has no regional wall motion abnormalities. There is severe left ventricular hypertrophy. Left ventricular diastolic parameters are consistent with Grade I diastolic dysfunction ( impaired relaxation) . Elevated left ventricular end- diastolic pressure. 2. Right ventricular systolic function is normal. The right ventricular size is normal. There is normal pulmonary artery systolic pressure. 3. Left atrial size was mildly dilated. 4. The mitral valve is normal in structure. Trivial mitral valve regurgitation. No evidence of mitral stenosis. 5. The aortic valve is tricuspid. There is mild calcification of the aortic valve. There is mild thickening of the aortic valve. Aortic valve regurgitation is not visualized. Mild aortic valve stenosis. 6. The inferior vena cava is normal in size with greater than 50% respiratory variability, suggesting right atrial pressure of 3 mmHg.   Neuro/Psych  Headaches PSYCHIATRIC DISORDERS  Depression     Neuromuscular disease    GI/Hepatic Neg liver ROS, hiatal hernia,GERD  Medicated and Controlled,,  Endo/Other  diabetesHypothyroidism    Renal/GU Renal disease     Musculoskeletal negative  musculoskeletal ROS (+)    Abdominal  (+) + obese  Peds  Hematology  (+) Blood dyscrasia, anemia Thrombocytopenia    Anesthesia Other Findings gross hematuria  Reproductive/Obstetrics                             Anesthesia Physical Anesthesia Plan  ASA: 4  Anesthesia Plan: General   Post-op Pain Management:    Induction: Intravenous  PONV Risk Score and Plan: 2 and Ondansetron, Dexamethasone, Midazolam and Treatment may vary due to age or medical condition  Airway Management Planned: LMA  Additional Equipment:   Intra-op Plan:   Post-operative Plan: Extubation in OR  Informed Consent: I have reviewed the patients History and Physical, chart, labs and discussed the procedure including the risks, benefits and alternatives for the proposed anesthesia with the patient or authorized representative who has indicated his/her understanding and acceptance.     Dental advisory given  Plan Discussed with: CRNA  Anesthesia Plan Comments:        Anesthesia Quick Evaluation

## 2023-06-21 NOTE — OR Nursing (Signed)
20 Fr. 3 way Latex catheter removed In [facility] OR #2 prior to case start.

## 2023-06-21 NOTE — Anesthesia Postprocedure Evaluation (Signed)
Anesthesia Post Note  Patient: Peter Dunlap  Procedure(s) Performed: CYSTOSCOPY WITH FULGERATION and clot evacuation (Bladder)     Patient location during evaluation: PACU Anesthesia Type: General Level of consciousness: awake Pain management: pain level controlled Vital Signs Assessment: post-procedure vital signs reviewed and stable Respiratory status: spontaneous breathing, nonlabored ventilation and respiratory function stable Cardiovascular status: blood pressure returned to baseline and stable Postop Assessment: no apparent nausea or vomiting Anesthetic complications: no   No notable events documented.  Last Vitals:  Vitals:   06/21/23 1330 06/21/23 1345  BP: (!) 150/81 (!) 149/84  Pulse: 63 61  Resp: 17 15  Temp:  36.6 C  SpO2: 98% 98%    Last Pain:  Vitals:   06/21/23 1345  TempSrc:   PainSc: 0-No pain                 [name]

## 2023-06-21 NOTE — Discharge Instructions (Addendum)
You may see some blood in the urine and may have some burning with urination for 48-72 hours. You also may notice that you have to urinate more frequently or urgently after your procedure which is normal.  You should call should you develop an inability urinate, fever > 101, persistent nausea and vomiting that prevents you from eating or drinking to stay hydrated.  If you have a catheter, you will be taught how to take care of the catheter by the nursing staff prior to discharge from the hospital.  You may periodically feel a strong urge to void with the catheter in place.  This is a bladder spasm and most often can occur when having a bowel movement or moving around. It is typically self-limited and usually will stop after a few minutes.  You may use some Vaseline or Neosporin around the tip of the catheter to reduce friction at the tip of the penis. You may also see some blood in the urine.  A very small amount of blood can make the urine look quite red.  As long as the catheter is draining well, there usually is not a problem.  However, if the catheter is not draining well and is bloody, you should call the office [phone]) to notify us.  It is okay to irrigate the catheter if you continue to have some clots.  Be sure not to take any further aspirin.        Post Anesthesia Home Care Instructions  Activity: Get plenty of rest for the remainder of the day. A responsible individual must stay with you for 24 hours following the procedure.  For the next 24 hours, DO NOT: -Drive a car -Advertising copywriter -Drink alcoholic beverages -Take any medication unless instructed by your physician -Make any legal decisions or sign important papers.  Meals: Start with liquid foods such as gelatin or soup. Progress to regular foods as tolerated. Avoid greasy, spicy, heavy foods. If nausea and/or vomiting occur, drink only clear liquids until the nausea and/or vomiting subsides. Call your physician if  vomiting continues.  Special Instructions/Symptoms: Your throat may feel dry or sore from the anesthesia or the breathing tube placed in your throat during surgery. If this causes discomfort, gargle with warm salt water. The discomfort should disappear within 24 hours.

## 2023-06-21 NOTE — Anesthesia Procedure Notes (Signed)
Procedure Name: LMA Insertion Date/Time: 06/21/2023 12:15 PM  Performed by: Francie Massing, CRNAPre-anesthesia Checklist: Patient identified, Emergency Drugs available, Suction available and Patient being monitored Patient Re-evaluated:Patient Re-evaluated prior to induction Oxygen Delivery Method: Circle system utilized Preoxygenation: Pre-oxygenation with 100% oxygen Induction Type: IV induction Ventilation: Mask ventilation without difficulty LMA: LMA inserted and LMA with gastric port inserted LMA Size: 5.0 Number of attempts: 1 Airway Equipment and Method: Bite block Placement Confirmation: positive ETCO2 Tube secured with: Tape Dental Injury: Teeth and Oropharynx as per pre-operative assessment

## 2023-06-21 NOTE — Op Note (Signed)
Preoperative diagnosis: Persistent gross hematuria, history of radiotherapy  Postoperative diagnosis: Same  Principal procedure: Cystoscopy, bladder biopsy, fulguration of bleeder  Surgeon: Dequan Kindred  Anesthesia: General With LMA  Complications: None  Estimated blood loss: Less than 25 mL  Specimen: Bladder biopsies, for permanent specimen  Indications: 68 year old male with history of bladder cancer.  He completed radiotherapy in 2018.  He recently presented with persistent gross hematuria.  This has been treated with catheter drainage and multiple irrigations.  He persists with this, and presents at this time for cystoscopy, possible clot evacuation, possible bladder biopsy and fulguration of bleeder.  Findings: Urethra was free of stricture/lesions.  Prostate was nonobstructive.  Small telangiectatic vessels at the bladder neck posteriorly.  Raised urothelium in the posterior midline of the bladder above the trigone, most likely secondary to catheter related inflammation.  Ureteral orifices were normal.  I did not see, other than in the posterior midline, any urothelial lesions.  There was 1 small telangiectatic vessel in the bladder neck on the right as well that displayed bleeding during the procedure.  There was no bloody efflux from either ureter.  Description of procedure: Patient properly identified in the holding area.  He received preoperative IV antibiotics.  Taken to the operating room where general anesthetic was administered.  Placed in the dorsolithotomy position, genitalia and perineum were prepped, draped, proper timeout performed.  21 [place] panendoscope advanced under direct vision.  No significant prostatic pathology was noted.  A very small amount of bleeding in the posterior bladder neck just above the prostate in the midline.  Ureteral orifices normal.  Not unexpected appearance of the posterior midline above the trigone secondary to probable mild trauma from the tip of  the catheter.  No other bladder lesions were seen.  The abnormal appearing urothelium in the midline was biopsied x 3 with a cold cup forceps.  Sent for permanent pathology labeled "bladder biopsies" Bugbee electrode used to provide hemostasis in this area.  The 2 small bleeding areas, 1 in the posterior bladder neck midline and 1 in the right trigonal area were cauterized with the Bugbee electrode.  The bladder was then inspected with the irrigation off following cauterization.  No active bleeding was seen at this point.  The scope was then removed.  I placed a 20 French Foley catheter, this was placed to dependent drainage after the balloon was filled with 10 cc of water.  This point the procedure was terminated.  The patient was awakened, taken to the PACU in stable condition having tolerated the procedure well.

## 2023-06-21 NOTE — Transfer of Care (Signed)
Immediate Anesthesia Transfer of Care Note  Patient: Peter Dunlap  Procedure(s) Performed: CYSTOSCOPY WITH FULGERATION and clot evacuation (Bladder)  Patient Location: PACU  Anesthesia Type:General  Level of Consciousness: awake, alert , oriented, and patient cooperative  Airway & Oxygen Therapy: Patient Spontanous Breathing  Post-op Assessment: Report given to RN and Post -op Vital signs reviewed and stable  Post vital signs: Reviewed and stable  Last Vitals:  Vitals Value Taken Time  BP 129/79 06/21/23 1250  Temp    Pulse 63 06/21/23 1252  Resp 16 06/21/23 1252  SpO2 94 % 06/21/23 1252  Vitals shown include unfiled device data.  Last Pain:  Vitals:   06/21/23 1018  TempSrc:   PainSc: 10-Worst pain ever      Patients Stated Pain Goal: 8 (06/21/23 1018)  Complications: No notable events documented.

## 2023-06-22 ENCOUNTER — Other Ambulatory Visit: Payer: Self-pay

## 2023-06-22 ENCOUNTER — Encounter (HOSPITAL_BASED_OUTPATIENT_CLINIC_OR_DEPARTMENT_OTHER): Payer: Self-pay | Admitting: Urology

## 2023-06-22 LAB — SURGICAL PATHOLOGY

## 2023-06-22 NOTE — Progress Notes (Unsigned)
Name: Peter Dunlap DOB: 08-23-1955 MRN: [id number]  Diagnoses: Post-operative state  HPI: ELIAN VANVALKENBURGH presents post-operatively.  GU History: 1. Castrate sensitive metastatic prostate cancer. Refer to prior notes for history / treatment details. Followed by oncology (Dr. Ellin Saba). 2. LUTS. - Taking Rapaflo 8 mg 2x/day, Solifenacin 10 mg daily, and Gemtesa 75 mg daily. 3. Radiation cystitis.  Today He presents s/p the following procedures by Dr. Retta Diones on 06/21/2023:  Preoperative diagnosis: Persistent gross hematuria, history of radiotherapy   Postoperative diagnosis: Same   Principal procedure: Cystoscopy, bladder biopsy, fulguration of bleeder  Pathology:  A. BLADDER, BIOPSY:  - Polypoid cystitis  - Submucosa with telangiectasia and stromal hemorrhage   Postop course: Today He reports feeling well today; denies gross hematuria, flank pain, abdominal pain, fevers, nausea, or vomiting.   Fall Screening: Do you usually have a device to assist in your mobility? No   Medications: Current Outpatient Medications  Medication Sig Dispense Refill   acetaminophen (TYLENOL) 500 MG tablet Take 500-1,000 mg by mouth every 6 (six) hours as needed for moderate pain (pain score 4-6).     allopurinol (ZYLOPRIM) 100 MG tablet TAKE 1 TABLET(100 MG) BY MOUTH DAILY 90 tablet 0   apalutamide (ERLEADA) 60 MG tablet TAKE 4 TABLETS (240 MG TOTAL) BY MOUTH DAILY. MAY BE TAKEN WITH OR WITHOUT FOOD. SWALLOW TABLETS WHOLE. (Patient taking differently: at bedtime.) 120 tablet 3   Cyanocobalamin (VITAMIN B 12) 500 MCG TABS Take 500 mcg by mouth in the morning.     FARXIGA 5 MG TABS tablet Take 1 tablet (5 mg total) by mouth daily. 90 tablet 3   fluticasone (FLONASE) 50 MCG/ACT nasal spray Place 2 sprays into both nostrils daily. (Patient taking differently: Place 2 sprays into both nostrils daily as needed for allergies or rhinitis.) 16 g 6   isosorbide mononitrate (IMDUR) 30 MG 24 hr  tablet Take 1 tablet (30 mg total) by mouth daily. 90 tablet 3   levothyroxine (SYNTHROID) 200 MCG tablet Take 1 tablet (200 mcg total) by mouth daily before breakfast. 90 tablet 3   levothyroxine (SYNTHROID) 25 MCG tablet Take 1 tablet (25 mcg total) by mouth See admin instructions. Takes along with 200 mcg to total 225 mcg daily 90 tablet 3   loratadine (CLARITIN) 10 MG tablet TAKE 1 TABLET(10 MG) BY MOUTH DAILY AS NEEDED FOR ALLERGIES 30 tablet 2   naloxone (NARCAN) nasal spray 4 mg/0.1 mL Place 4 mg into the nose once as needed (accidental overdose).     nitroGLYCERIN (NITROSTAT) 0.4 MG SL tablet PLACE 1 TABLET UNDER THE TONGUE EVERY 5 MINUTES AS NEEDED FOR CHEST PAIN. CALL 911 AT THIRD DOSE IN 15 MINUTES 25 tablet 11   oxyCODONE-acetaminophen (PERCOCET) 10-325 MG tablet Take 1 tablet by mouth every 8 (eight) hours as needed for pain. 90 tablet 0   pantoprazole (PROTONIX) 40 MG tablet TAKE 1 TABLET(40 MG) BY MOUTH DAILY 30 tablet 3   phenazopyridine (PYRIDIUM) 100 MG tablet Take 1 tablet (100 mg total) by mouth 3 (three) times daily with meals. 10 tablet 0   rosuvastatin (CRESTOR) 40 MG tablet Take 40 mg by mouth every evening.     silodosin (RAPAFLO) 8 MG CAPS capsule TAKE 1 CAPSULE BY MOUTH TWICE DAILY 180 capsule 3   solifenacin (VESICARE) 10 MG tablet TAKE 1 TABLET(10 MG) BY MOUTH DAILY 30 tablet 11   sotalol (BETAPACE) 80 MG tablet Take 1 tablet (80 mg total) by mouth 2 (two) times daily.  180 tablet 3   Vibegron (GEMTESA) 75 MG TABS Take 75 mg by mouth every Monday, Wednesday, and Friday.     Vitamin D, Ergocalciferol, (DRISDOL) 1.25 MG (50000 UNIT) CAPS capsule TAKE 1 CAPSULE BY MOUTH EVERY 7 DAYS (Patient taking differently: Take 50,000 Units by mouth every 14 (fourteen) days.) 12 capsule 0   No current facility-administered medications for this visit.    Allergies: Allergies  Allergen Reactions   Lactose Intolerance (Gi) Other (See Comments)    UPSET STOMACH    Trazodone And  Nefazodone     Nightmares    Past Medical History:  Diagnosis Date   Ambulates with cane    Anemia    history of iron infusions in 2024   Arteriosclerotic cardiovascular disease (ASCVD) 2005   catheterization in 10/2010:50% mid LAD, diffuse distal disease, circumflex irregularities, large dominant RCA with a 50% ostial, 70% distal, 60% posterolateral and 70% PDA; normal EF   Arthritis    Benign prostatic hypertrophy    Bilateral carpal tunnel syndrome 07/03/2018   Cerebrovascular disease 2010   R. carotid endarterectomy; Duplex in 10/2010-widely patent ICAs, subtotal left vertebral-not thought to be contributing to symptoms   Cervical spine disease    CT in 2012-advanced degeneration and spondylosis with moderate spinal stenosis at C3-C6   CHF (congestive heart failure) [facility])    Follows w/ cardiologist, Dr. Lewayne Bunting.   Depression    Diabetes mellitus without complication (HCC)    Takes [place].   Erectile dysfunction    Family history of breast cancer    Family history of cancer of mouth    Family history of CML (chronic monocytic leukemia)    Family history of lung cancer    Family history of ovarian cancer    Family history of stomach cancer    Full dentures    Wife states that he usually does not wear his dentures.   Gastroesophageal reflux disease    H/O hiatal hernia    H/O: substance abuse (HCC)    Cocaine, marijuana, alcohol.  Quit 2013.    Headache    Hyperlipidemia    Hypertension    Follows w/ cardiologist, Dr. Lewayne Bunting.   Hypothyroidism    Follows w/ Ronny Bacon, NP, endocrinology.   Non-ST elevation myocardial infarction (NSTEMI), initial episode of care [facility]) 12/02/2013   DES LAD   Obesity    Presence of permanent cardiac pacemaker    Prostate cancer (HCC)    Sleep apnea    CPAP   Tachy-brady syndrome (HCC)    a. s/p STJ dual chamber PPM    Thyroid disease    Follows w/ endocrinology,   Tobacco abuse    Quit 2014   Ulnar neuropathy at  elbow 07/03/2018   Bilateral   Wears glasses    Past Surgical History:  Procedure Laterality Date   BRAIN SURGERY  2015   hematoma evacuation   BURR HOLE Right 04/13/2014   Procedure: Ezekiel Ina;  Surgeon: Temple Pacini, MD;  Location: [facility]UR[facility];  Service: Neurosurgery;  Laterality: Right;   CAROTID ENDARTERECTOMY Right Feb. 25, 2010    CEA   COLONOSCOPY WITH PROPOFOL N/A 06/24/2021   Procedure: COLONOSCOPY WITH PROPOFOL;  Surgeon: Jeani Hawking, MD;  Location: [facility] ENDOSCOPY;  Service: Endoscopy;  Laterality: N/A;   CORONARY ANGIOPLASTY WITH STENT PLACEMENT  12/03/2013   LAD 90%-->0% W/ Promus Premier DES 3.0 mm x 16 mm, CFX OK, RCA 40%, EF 70-75%   CYSTOSCOPY WITH FULGERATION  N/A 06/21/2023   Procedure: CYSTOSCOPY WITH FULGERATION and clot evacuation;  Surgeon: Marcine Matar, MD;  Location: [facility];  Service: Urology;  Laterality: N/A;   LEFT ATRIAL APPENDAGE OCCLUSION N/A 08/05/2015   Procedure: LEFT ATRIAL APPENDAGE OCCLUSION;  Surgeon: [name], MD;  Location: [facility] INVASIVE CV LAB;  Service: Cardiovascular;  Laterality: N/A;   LEFT HEART CATH AND CORONARY ANGIOGRAPHY N/A 09/13/2021   Procedure: LEFT HEART CATH AND CORONARY ANGIOGRAPHY;  Surgeon: Orbie Pyo, MD;  Location: [facility] INVASIVE CV LAB;  Service: Cardiovascular;  Laterality: N/A;   LEFT HEART CATHETERIZATION WITH CORONARY ANGIOGRAM Left 12/03/2013   Procedure: LEFT HEART CATHETERIZATION WITH CORONARY ANGIOGRAM;  Surgeon: Marykay Lex, MD;  Location: [facility] CATH LAB;  Service: Cardiovascular;  Laterality: Left;   LEFT HEART CATHETERIZATION WITH CORONARY ANGIOGRAM N/A 01/26/2014   Procedure: LEFT HEART CATHETERIZATION WITH CORONARY ANGIOGRAM;  Surgeon: Corky Crafts, MD;  Location[facility] CATH LAB;  Service: Cardiovascular;  Laterality: N/A;   LEFT HEART CATHETERIZATION WITH CORONARY ANGIOGRAM N/A 08/03/2014   Procedure: LEFT HEART CATHETERIZATION WITH CORONARY ANGIOGRAM;  Surgeon: Kathleene Hazel, MD;   Location: [facility] CATH LAB;  Service: Cardiovascular;  Laterality: N/A;   PERCUTANEOUS CORONARY STENT INTERVENTION (PCI-S)  12/03/2013   Procedure: PERCUTANEOUS CORONARY STENT INTERVENTION (PCI-S);  Surgeon: Marykay Lex, MD;  Location: [facility] CATH LAB;  Service: Cardiovascular;;   PERMANENT PACEMAKER INSERTION N/A 09/18/2014   Procedure: PERMANENT PACEMAKER INSERTION;  Surgeon: Marinus Maw, MD;  Location: [facility] CATH LAB;  Service: Cardiovascular;  Laterality: N/A;   POLYPECTOMY  06/24/2021   Procedure: POLYPECTOMY;  Surgeon: Jeani Hawking, MD;  Location: [facility] ENDOSCOPY;  Service: Endoscopy;;   RADIOFREQUENCY ABLATION  2005   for PSVT   TEE WITHOUT CARDIOVERSION N/A 07/27/2015   Procedure: TRANSESOPHAGEAL ECHOCARDIOGRAM (TEE);  Surgeon: Lewayne Bunting, MD;  Location: [facility] ENDOSCOPY;  Service: Cardiovascular;  Laterality: N/A;   TEE WITHOUT CARDIOVERSION N/A 09/15/2015   Procedure: TRANSESOPHAGEAL ECHOCARDIOGRAM (TEE);  Surgeon: Vesta Mixer, MD;  Location[facility] ENDOSCOPY;  Service: Cardiovascular;  Laterality: N/A;   Family History  Problem Relation Age of Onset   Hypertension Mother        Cerebrovascular disease   Diabetes Mother    Coronary artery disease Father 58   Diabetes type II Father    Hypertension Father    Heart attack Father    Diabetes Brother    Hypertension Brother    Diabetes Sister    Hypertension Sister    Heart attack Sister 74   Leukemia Sister 75       CML   Breast cancer Maternal Grandmother        dx 30s   Lung cancer Maternal Uncle        dx >50, smoker   Cancer Cousin        mouth cancer, dx 67s, no smoking/chew tobacco hx (maternal 1st cousin)   Ovarian cancer Cousin        dx <50 (maternal 1st cousin)   Stomach cancer Cousin        dx 23s (maternal 1st cousin)   Cancer Cousin        type unknown to pt, dx >50 (paternal 1st cousin)   Social History   Socioeconomic History   Marital status: Married    Spouse name: Not on file   Number of children: 0    Years of education: Not on file   Highest education level: Not on file  Occupational  History   Occupation: Retired  Tobacco Use   Smoking status: Former    Current packs/day: 0.00    Average packs/day: 1 pack/day for 40.0 years (40.0 ttl pk-yrs)    Types: Cigarettes    Start date: 10/20/1972    Quit date: 10/10/2012    Years since quitting: 10.7   Smokeless tobacco: Never   Tobacco comments:    Quit in May.   Vaping Use   Vaping status: Never Used  Substance and Sexual Activity   Alcohol use: Not Currently    Comment: former drinker-- sober since 2013.    Drug use: Not Currently    Types: Cocaine    Comment: quit cocaine 10/2011   Sexual activity: Yes    Partners: Female  Other Topics Concern   Not on file  Social History Narrative   Lives in [place].   Social Drivers of Corporate investment banker Strain: Low Risk  (04/19/2020)   Overall Financial Resource Strain (CARDIA)    Difficulty of Paying Living Expenses: Not hard at all  Food Insecurity: No Food Insecurity (06/08/2023)   Hunger Vital Sign    Worried About Running Out of Food in the Last Year: Never true    Ran Out of Food in the Last Year: Never true  Transportation Needs: No Transportation Needs (06/08/2023)   PRAPARE - Administrator, Civil Service (Medical): No    Lack of Transportation (Non-Medical): No  Physical Activity: Inactive (04/19/2020)   Exercise Vital Sign    Days of Exercise per Week: 0 days    Minutes of Exercise per Session: 0 min  Stress: No Stress Concern Present (04/19/2020)   [employer] of Occupational Health - Occupational Stress Questionnaire    Feeling of Stress : Not at all  Social Connections: Socially Integrated (06/08/2023)   Social Connection and Isolation Panel [NHANES]    Frequency of Communication with Friends and Family: More than three times a week    Frequency of Social Gatherings with Friends and Family: Three times a week    Attends Religious Services:  More than 4 times per year    Active Member of Clubs or Organizations: Yes    Attends Banker Meetings: More than 4 times per year    Marital Status: Married  Catering manager Violence: Not At Risk (06/08/2023)   Humiliation, Afraid, Rape, and Kick questionnaire    Fear of Current or Ex-Partner: No    Emotionally Abused: No    Physically Abused: No    Sexually Abused: No    SUBJECTIVE  Review of Systems Constitutional: Patient denies any unintentional weight loss or change in strength lntegumentary: Patient denies any rashes or pruritus Cardiovascular: Patient denies chest pain or syncope Respiratory: Patient denies shortness of breath Gastrointestinal: Patient denies nausea, vomiting, constipation, or diarrhea Musculoskeletal: Patient denies muscle cramps or weakness Neurologic: Patient denies convulsions or seizures Allergic/Immunologic: Patient denies recent allergic reaction(s) Hematologic/Lymphatic: Patient denies bleeding tendencies Endocrine: Patient denies heat/cold intolerance  GU: As per HPI.  OBJECTIVE Vitals:   06/27/23 1125  BP: 109/67  Pulse: 77   There is no height or weight on file to calculate BMI.  Physical Examination Constitutional: No obvious distress; patient is non-toxic appearing  Cardiovascular: No visible lower extremity edema.  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   Voiding trial was  done and pt was able to successfully empty bladder of the contents instilled. Foley catheter to remain out. Discussion was had about increasing water intake for the next few day to insure good voiding.  Will plan for follow up as previously scheduled with Dr. Retta Diones on 07/24/2023 or sooner if needed. Pt verbalized understanding and agreement. All questions were answered.  PLAN Advised the following: Foley catheter discontinued. Return in 27 days (on 07/24/2023) for f/u with Dr. Retta Diones, as previously scheduled.   No orders of the defined types were placed in this encounter.   It has been explained that the patient is to follow regularly with their PCP in addition to all other providers involved in their care and to follow instructions provided by these respective offices. Patient advised to contact urology clinic if any urologic-pertaining questions, concerns, new symptoms or problems arise in the interim period.  There are no Patient Instructions on file for this visit.  Electronically signed by:  Donnita Falls, MSN, FNP-C, CUNP 06/27/2023 12:02 PM

## 2023-06-25 ENCOUNTER — Telehealth: Payer: Self-pay

## 2023-06-25 ENCOUNTER — Inpatient Hospital Stay: Payer: 59 | Attending: Hematology

## 2023-06-25 ENCOUNTER — Ambulatory Visit: Payer: 59 | Admitting: Nurse Practitioner

## 2023-06-25 VITALS — BP 104/68 | HR 73 | Temp 96.8°F | Resp 20

## 2023-06-25 DIAGNOSIS — Z5111 Encounter for antineoplastic chemotherapy: Secondary | ICD-10-CM | POA: Insufficient documentation

## 2023-06-25 DIAGNOSIS — Z191 Hormone sensitive malignancy status: Secondary | ICD-10-CM

## 2023-06-25 DIAGNOSIS — Z79899 Other long term (current) drug therapy: Secondary | ICD-10-CM | POA: Insufficient documentation

## 2023-06-25 DIAGNOSIS — C61 Malignant neoplasm of prostate: Secondary | ICD-10-CM | POA: Insufficient documentation

## 2023-06-25 MED ORDER — LEUPROLIDE ACETATE (6 MONTH) 45 MG ~~LOC~~ KIT
45.0000 mg | PACK | Freq: Once | SUBCUTANEOUS | Status: AC
Start: 2023-06-25 — End: 2023-06-25
  Administered 2023-06-25: 45 mg via SUBCUTANEOUS
  Filled 2023-06-25: qty 45

## 2023-06-25 NOTE — Progress Notes (Signed)
Patient presents today for Eligard injection per providers order.  Vital signs WNL.  Patient states that he recently was in the hospital due to blood in the urine and had to postpone his dental exam to February, so Prolia will be held at this time.   Stable during administration without incident; injection site WNL; see MAR for injection details.  Patient tolerated procedure well and without incident.  No questions or complaints noted at this time.

## 2023-06-25 NOTE — Telephone Encounter (Signed)
Tried calling patient with no answer.

## 2023-06-25 NOTE — Telephone Encounter (Signed)
Patient was made aware and voiced understanding.

## 2023-06-25 NOTE — Patient Instructions (Signed)
[facility] [place] - A DEPT OF [facility]  Discharge Instructions: Thank you for choosing [facility] to provide your oncology and hematology care.  If you have a lab appointment with the Cancer Center - please note that after April 8th, 2024, all labs will be drawn in the cancer center.  You do not have to check in or register with the main entrance as you have in the past but will complete your check-in in the cancer center.  Wear comfortable clothing and clothing appropriate for easy access to any Portacath or PICC line.   We strive to give you quality time with your provider. You may need to reschedule your appointment if you arrive late (15 or more minutes).  Arriving late affects you and other patients whose appointments are after yours.  Also, if you miss three or more appointments without notifying the office, you may be dismissed from the clinic at the provider's discretion.      For prescription refill requests, have your pharmacy contact our office and allow 72 hours for refills to be completed.    Today you received the following chemotherapy and/or immunotherapy agents Eligard      To help prevent nausea and vomiting after your treatment, we encourage you to take your nausea medication as directed.  BELOW ARE SYMPTOMS THAT SHOULD BE REPORTED IMMEDIATELY: *FEVER GREATER THAN 100.4 F (38 C) OR HIGHER *CHILLS OR SWEATING *NAUSEA AND VOMITING THAT IS NOT CONTROLLED WITH YOUR NAUSEA MEDICATION *UNUSUAL SHORTNESS OF BREATH *UNUSUAL BRUISING OR BLEEDING *URINARY PROBLEMS (pain or burning when urinating, or frequent urination) *BOWEL PROBLEMS (unusual diarrhea, constipation, pain near the anus) TENDERNESS IN MOUTH AND THROAT WITH OR WITHOUT PRESENCE OF ULCERS (sore throat, sores in mouth, or a toothache) UNUSUAL RASH, SWELLING OR PAIN  UNUSUAL VAGINAL DISCHARGE OR ITCHING   Items with * indicate a potential emergency and should be followed up  as soon as possible or go to the Emergency Department if any problems should occur.  Please show the CHEMOTHERAPY ALERT CARD or IMMUNOTHERAPY ALERT CARD at check-in to the Emergency Department and triage nurse.  Should you have questions after your visit or need to cancel or reschedule your appointment, please contact [facility] [place] - A DEPT OF [name] [phone]  and follow the prompts.  Office hours are 8:00 a.m. to 4:30 p.m. Monday - Friday. Please note that voicemails left after 4:00 p.m. may not be returned until the following business day.  We are closed weekends and major holidays. You have access to a nurse at all times for urgent questions. Please call the main number to the clinic [phone] and follow the prompts.  For any non-urgent questions, you may also contact your provider using MyChart. We now offer e-Visits for anyone 65 and older to request care online for non-urgent symptoms. For details visit [email or web address].   Also download the MyChart app! Go to the app store, search "MyChart", open the app, select , and log in with your MyChart username and password.

## 2023-06-25 NOTE — Telephone Encounter (Signed)
-----   Message from Bertram Millard Dahlstedt sent at 06/25/2023 12:14 PM EST ----- Please call pt and tell him that the biopsies from procedure showed no cancer ----- Message ----- From: Interface, Lab In Three [facility] Seven Sent: 06/22/2023  10:02 AM EST To: Marcine Matar, MD

## 2023-06-25 NOTE — Telephone Encounter (Signed)
Tried calling patient. Patient's wife said she would have patient call back.

## 2023-06-26 ENCOUNTER — Ambulatory Visit: Payer: 59 | Admitting: Urology

## 2023-06-27 ENCOUNTER — Ambulatory Visit (INDEPENDENT_AMBULATORY_CARE_PROVIDER_SITE_OTHER): Payer: 59 | Admitting: Urology

## 2023-06-27 VITALS — BP 109/67 | HR 77

## 2023-06-27 DIAGNOSIS — C61 Malignant neoplasm of prostate: Secondary | ICD-10-CM

## 2023-06-27 DIAGNOSIS — R310 Gross hematuria: Secondary | ICD-10-CM

## 2023-06-27 DIAGNOSIS — N3040 Irradiation cystitis without hematuria: Secondary | ICD-10-CM

## 2023-06-27 DIAGNOSIS — Z09 Encounter for follow-up examination after completed treatment for conditions other than malignant neoplasm: Secondary | ICD-10-CM

## 2023-06-27 DIAGNOSIS — Z191 Hormone sensitive malignancy status: Secondary | ICD-10-CM

## 2023-06-27 NOTE — Progress Notes (Signed)
Fill and Pull Catheter Removal  Patient is present today for a catheter removal.  Patient was cleaned and prepped in a sterile fashion of sterile water/ saline was instilled into the bladder when the patient felt the urge to urinate. 10ml of water was then drained from the balloon.  A 22FR foley cath was removed from the bladder no complications were noted .  Patient as then given some time to void on their own.  Patient can void  on their own after some time.  Patient tolerated well.  Performed by: Gwendolyn Grant CMA  Follow up/ Additional notes: As scheduled

## 2023-07-18 ENCOUNTER — Other Ambulatory Visit: Payer: Self-pay

## 2023-07-18 ENCOUNTER — Other Ambulatory Visit (HOSPITAL_COMMUNITY): Payer: Self-pay

## 2023-07-18 NOTE — Progress Notes (Signed)
Specialty Pharmacy Refill Coordination Note  Peter Dunlap is a 68 y.o. male contacted today regarding refills of specialty medication(s) Apalutamide Lavone Neri)   Patient requested Delivery   Delivery date: 07/26/23   Verified address: [address]   [place] [address]   Medication will be filled on 07/25/23.

## 2023-07-19 ENCOUNTER — Ambulatory Visit: Payer: 59 | Admitting: Podiatry

## 2023-07-23 NOTE — Progress Notes (Signed)
History of Present Illness: Fount is here today for follow-up of prostate cancer and BPH w/ LUTS:    Prostate cancer:    He underwent TRUS/Bx on 8.15.2017. At that time, PSA was 9.09. Prostatic volume was 25 cc. 10/12 cores came back positive for adenocarcinoma as follows: 1 core revealed GS 3+3 5 cores revealed GS 3+4 4 cores revealed GS 4+3   He completed IMRT, 40 sessions, on 1.10.2018   8.24.2021: Repeat TRUS/Bx for elevating PSA trend following definitive Rx. 1/12 cores-from the left mid lateral region-came back positive for adenocarcinoma.  Grading was difficult due to his prior radiation.     10.15.2021: -CT A/P  with newly enlarged left iliac lymph nodes measuring 1.1 x 0.9 cm.  Hepatic steatosis with early signs of cirrhosis.   04/12/2020 -F-18 PSMA PET scan on showed radiotracer activity associated with enlarged left external iliac lymph node 9 mm with SUV 7.6.  Small left common iliac lymph node 6 mm, SUV 8.6.  Left periaortic lymph node 5 mm, SUV 6.3.  Lymph node between IVC and aorta at the level of right renal hilum, SUV 8.6.  Lymph node deep to the IVC measuring 6 mm, SUV 5.4.  No skeletal metastasis.   11.17.2021: Began ADT with [place].   7.5.2022: Continues Firmagon and Erleada  Most recent PSA is less than 0.1.  Testosterone level 5.   11.9.2022: PSA < 0.01. T level castrate.  Continues [place] and [place]   7.18.2023: PSA is 0.01, testosterone level still castrate.  Continues on [place].    11.21.2023: Most recent PSA in September less than 0.01, nadir level for him.  He did experience hematuria in September-passed a clot,   5.21.2024: PSA 2 months ago was still undetectable.  Still with urinary frequency, urgency and urgency incontinence as well as nocturia.  On Rapaflo twice a day as well as Solifenacin 10 mg.   8.7.2024:  He was switched to leuprolide 45 mg from his monthly Firmagon on the 18th of last month.  He is much more  comfortable with this.  Most recent PSA was undetectable.  At his last visit he was started on Gemtesa in addition to his Solifenacin.    11.11.2024: He follows regularly with Dr. Ellin Saba.  He is on leuprolide 45 mg every 6 months, last given in July.  Also on apalutamide and Prolia.  Most recent PSA 2 months ago is less than 0.1.  He is having no hot flashes.  He takes vitamin D supplements.  He does get some exercise-walking on a regular basis.  No bony pain.     BPH   11.12.2024: He denies significant lower urinary tract symptoms.  He is having no blood in his urine.  He is on Solifenacin and alfuzosin   Gross hematuria:  He underwent cystoscopy, bladder biopsy and fulguration of bleeders on 06/21/2023.  Bladder biopsies revealed benign findings.       Past Medical History:  Diagnosis Date   Ambulates with cane    Anemia    history of iron infusions in 2024   Arteriosclerotic cardiovascular disease (ASCVD) 2005   catheterization in 10/2010:50% mid LAD, diffuse distal disease, circumflex irregularities, large dominant RCA with a 50% ostial, 70% distal, 60% posterolateral and 70% PDA; normal EF   Arthritis    Benign prostatic hypertrophy    Bilateral carpal tunnel syndrome 07/03/2018   Cerebrovascular disease 2010   R. carotid endarterectomy; Duplex in 10/2010-widely patent ICAs, subtotal left vertebral-not  thought to be contributing to symptoms   Cervical spine disease    CT in 2012-advanced degeneration and spondylosis with moderate spinal stenosis at C3-C6   CHF (congestive heart failure) [facility])    Follows w/ cardiologist, Dr. Lewayne Bunting.   Depression    Diabetes mellitus without complication (HCC)    Takes [place].   Erectile dysfunction    Family history of breast cancer    Family history of cancer of mouth    Family history of CML (chronic monocytic leukemia)    Family history of lung cancer    Family history of ovarian cancer    Family history of stomach cancer     Full dentures    Wife states that he usually does not wear his dentures.   Gastroesophageal reflux disease    H/O hiatal hernia    H/O: substance abuse (HCC)    Cocaine, marijuana, alcohol.  Quit 2013.    Headache    Hyperlipidemia    Hypertension    Follows w/ cardiologist, Dr. Lewayne Bunting.   Hypothyroidism    Follows w/ Ronny Bacon, NP, endocrinology.   Non-ST elevation myocardial infarction (NSTEMI), initial episode of care [facility]) 12/02/2013   DES LAD   Obesity    Presence of permanent cardiac pacemaker    Prostate cancer (HCC)    Sleep apnea    CPAP   Tachy-brady syndrome (HCC)    a. 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 25, 2010    CEA   COLONOSCOPY WITH PROPOFOL N/A 06/24/2021   Procedure: COLONOSCOPY WITH PROPOFOL;  Surgeon: Jeani Hawking, MD;  Location: [facility] ENDOSCOPY;  Service: Endoscopy;  Laterality: N/A;   CORONARY ANGIOPLASTY WITH STENT PLACEMENT  12/03/2013   LAD 90%-->0% W/ Promus Premier DES 3.0 mm x 16 mm, CFX OK, RCA 40%, EF 70-75%   CYSTOSCOPY WITH FULGERATION N/A 06/21/2023   Procedure: CYSTOSCOPY WITH FULGERATION and clot evacuation;  Surgeon: Marcine Matar, MD;  Location: [facility];  Service: Urology;  Laterality: N/A;   LEFT ATRIAL APPENDAGE OCCLUSION N/A 08/05/2015   Procedure: LEFT ATRIAL APPENDAGE OCCLUSION;  Surgeon: [name], MD;  Location: [facility] INVASIVE CV LAB;  Service: Cardiovascular;  Laterality: N/A;   LEFT HEART CATH AND CORONARY ANGIOGRAPHY N/A 09/13/2021   Procedure: LEFT HEART CATH AND CORONARY ANGIOGRAPHY;   Surgeon: Orbie Pyo, MD;  Location: [facility] INVASIVE CV LAB;  Service: Cardiovascular;  Laterality: N/A;   LEFT HEART CATHETERIZATION WITH CORONARY ANGIOGRAM Left 12/03/2013   Procedure: LEFT HEART CATHETERIZATION WITH CORONARY ANGIOGRAM;  Surgeon: Marykay Lex, MD;  Location: [facility] CATH LAB;  Service: Cardiovascular;  Laterality: Left;   LEFT HEART CATHETERIZATION WITH CORONARY ANGIOGRAM N/A 01/26/2014   Procedure: LEFT HEART CATHETERIZATION WITH CORONARY ANGIOGRAM;  Surgeon: Corky Crafts, MD;  Location: [facility] CATH LAB;  Service: Cardiovascular;  Laterality: N/A;   LEFT HEART CATHETERIZATION WITH CORONARY ANGIOGRAM N/A 08/03/2014   Procedure: LEFT HEART CATHETERIZATION WITH CORONARY ANGIOGRAM;  Surgeon: Kathleene Hazel, MD;  Location: [facility] CATH LAB;  Service: Cardiovascular;  Laterality: N/A;   PERCUTANEOUS CORONARY STENT INTERVENTION (PCI-S)  12/03/2013   Procedure: PERCUTANEOUS CORONARY STENT INTERVENTION (PCI-S);  Surgeon: Marykay Lex, MD;  Location: [facility] CATH LAB;  Service: Cardiovascular;;   PERMANENT PACEMAKER INSERTION N/A 09/18/2014   Procedure: PERMANENT PACEMAKER INSERTION;  Surgeon: Marinus Maw, MD;  Location: [facility] CATH LAB;  Service: Cardiovascular;  Laterality: N/A;   POLYPECTOMY  06/24/2021   Procedure: POLYPECTOMY;  Surgeon: Jeani Hawking, MD;  Location: [facility] ENDOSCOPY;  Service: Endoscopy;;   RADIOFREQUENCY ABLATION  2005   for PSVT   TEE WITHOUT CARDIOVERSION N/A 07/27/2015   Procedure: TRANSESOPHAGEAL ECHOCARDIOGRAM (TEE);  Surgeon: Lewayne Bunting, MD;  Location: [facility] ENDOSCOPY;  Service: Cardiovascular;  Laterality: N/A;   TEE WITHOUT CARDIOVERSION N/A 09/15/2015   Procedure: TRANSESOPHAGEAL ECHOCARDIOGRAM (TEE);  Surgeon: Vesta Mixer, MD;  Location: [facility] ENDOSCOPY;  Service: Cardiovascular;  Laterality: N/A;    Home Medications:  Allergies as of 07/24/2023       Reactions   Lactose Intolerance (gi) Other (See Comments)   UPSET STOMACH   Trazodone And Nefazodone     Nightmares        Medication List        Accurate as of July 23, 2023 12:11 PM. If you have any questions, ask your nurse or doctor.          acetaminophen 500 MG tablet Commonly known as: TYLENOL Take 500-1,000 mg by mouth every 6 (six) hours as needed for moderate pain (pain score 4-6).   allopurinol 100 MG tablet Commonly known as: ZYLOPRIM TAKE 1 TABLET(100 MG) BY MOUTH DAILY   Erleada 60 MG tablet Generic drug: apalutamide TAKE 4 TABLETS (240 MG TOTAL) BY MOUTH DAILY. MAY BE TAKEN WITH OR WITHOUT FOOD. SWALLOW TABLETS WHOLE. What changed: when to take this   Farxiga 5 MG Tabs tablet Generic drug: dapagliflozin propanediol Take 1 tablet (5 mg total) by mouth daily.   fluticasone 50 MCG/ACT nasal spray Commonly known as: FLONASE Place 2 sprays into both nostrils daily. What changed:  when to take this reasons to take this   Gemtesa 75 MG Tabs Generic drug: Vibegron Take 75 mg by mouth every Monday, Wednesday, and Friday.   isosorbide mononitrate 30 MG 24 hr tablet Commonly known as: IMDUR Take 1 tablet (30 mg total) by mouth daily.   levothyroxine 25 MCG tablet Commonly known as: SYNTHROID Take 1 tablet (25 mcg total) by mouth See admin instructions. Takes along with 200 mcg to total 225 mcg daily   levothyroxine 200 MCG tablet Commonly known as: SYNTHROID Take 1 tablet (200 mcg total) by mouth daily before breakfast.   loratadine 10 MG tablet Commonly known as: CLARITIN TAKE 1 TABLET(10 MG) BY MOUTH DAILY AS NEEDED FOR ALLERGIES   naloxone 4 MG/0.1ML Liqd nasal spray kit Commonly known as: NARCAN Place 4 mg into the nose once as needed (accidental overdose).   nitroGLYCERIN 0.4 MG SL tablet Commonly known as: NITROSTAT PLACE 1 TABLET UNDER THE TONGUE EVERY 5 MINUTES AS NEEDED FOR CHEST PAIN. CALL 911 AT THIRD DOSE IN 15 MINUTES   oxyCODONE-acetaminophen 10-325 MG tablet Commonly known as: PERCOCET Take 1 tablet by mouth every 8 (eight)  hours as needed for pain.   pantoprazole 40 MG tablet Commonly known as: PROTONIX TAKE 1 TABLET(40 MG) BY MOUTH DAILY   phenazopyridine 100 MG tablet Commonly known as: PYRIDIUM Take 1 tablet (100 mg total) by mouth 3 (three) times  daily with meals.   rosuvastatin 40 MG tablet Commonly known as: CRESTOR Take 40 mg by mouth every evening.   silodosin 8 MG Caps capsule Commonly known as: RAPAFLO TAKE 1 CAPSULE BY MOUTH TWICE DAILY   solifenacin 10 MG tablet Commonly known as: VESICARE TAKE 1 TABLET(10 MG) BY MOUTH DAILY   sotalol 80 MG tablet Commonly known as: BETAPACE Take 1 tablet (80 mg total) by mouth 2 (two) times daily.   Vitamin B 12 500 MCG Tabs Take 500 mcg by mouth in the morning.   Vitamin D (Ergocalciferol) 1.25 MG (50000 UNIT) Caps capsule Commonly known as: DRISDOL TAKE 1 CAPSULE BY MOUTH EVERY 7 DAYS What changed: when to take this        Allergies:  Allergies  Allergen Reactions   Lactose Intolerance (Gi) Other (See Comments)    UPSET STOMACH    Trazodone And Nefazodone     Nightmares    Family History  Problem Relation Age of Onset   Hypertension Mother        Cerebrovascular disease   Diabetes Mother    Coronary artery disease Father 22   Diabetes type II Father    Hypertension Father    Heart attack Father    Diabetes Brother    Hypertension Brother    Diabetes Sister    Hypertension Sister    Heart attack Sister 55   Leukemia Sister 44       CML   Breast cancer Maternal Grandmother        dx 28s   Lung cancer Maternal Uncle        dx >50, smoker   Cancer Cousin        mouth cancer, dx 35s, no smoking/chew tobacco hx (maternal 1st cousin)   Ovarian cancer Cousin        dx <50 (maternal 1st cousin)   Stomach cancer Cousin        dx 12s (maternal 1st cousin)   Cancer Cousin        type unknown to pt, dx >50 (paternal 1st cousin)    Social History:  reports that he quit smoking about 10 years ago. His smoking use included  cigarettes. He started smoking about 50 years ago. He has a 40 pack-year smoking history. He has never used smokeless tobacco. He reports that he does not currently use alcohol. He reports that he does not currently use drugs after having used the following drugs: Cocaine.  ROS: A complete review of systems was performed.  All systems are negative except for pertinent findings as noted.  Physical Exam:  Vital signs in last 24 hours: There were no vitals taken for this visit. Constitutional:  Alert and oriented, No acute distress Cardiovascular: Regular rate  Respiratory: Normal respiratory effort GU: Refused rectal exam Neurologic: Grossly intact, no focal deficits Psychiatric: Normal mood and affect  Bladder was irrigated with approximately 2 L of saline until clear, free of clots.  I have reviewed prior pt notes  I have reviewed notes from referring/previous physicians--Dr. Ellin Saba  I have independently reviewed prior imaging--bone density study from June 2023 revealed osteopenia  I have reviewed prior PSA results    Impression/Assessment:  1.  Castrate sensitive metastatic prostate cancer, on leuprolide, apalutamide with excellent PSA response that is durable  2.  Osteopenia, on Prolia  3.  BPH with lower urinary tract symptoms, on silodosin and Solifenacin, doing well  4.  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  He was given Cipro to cover his intervention today  2.  Continue to stay off aspirin, drink plenty of fluids  3.  Nurse visit in 1 to 2 days to check on catheter/urine, possible TOVA  4.  He will need eventual cystoscopy and upper tract evaluation  5.  CBC checked today

## 2023-07-24 ENCOUNTER — Other Ambulatory Visit: Payer: 59 | Admitting: Urology

## 2023-07-24 ENCOUNTER — Encounter: Payer: Self-pay | Admitting: Urology

## 2023-07-24 ENCOUNTER — Ambulatory Visit (INDEPENDENT_AMBULATORY_CARE_PROVIDER_SITE_OTHER): Payer: 59 | Admitting: Urology

## 2023-07-24 ENCOUNTER — Other Ambulatory Visit: Payer: Self-pay

## 2023-07-24 VITALS — BP 121/74 | HR 78

## 2023-07-24 DIAGNOSIS — C61 Malignant neoplasm of prostate: Secondary | ICD-10-CM

## 2023-07-24 DIAGNOSIS — Z191 Hormone sensitive malignancy status: Secondary | ICD-10-CM | POA: Diagnosis not present

## 2023-07-24 DIAGNOSIS — Z87898 Personal history of other specified conditions: Secondary | ICD-10-CM | POA: Diagnosis not present

## 2023-07-24 DIAGNOSIS — R310 Gross hematuria: Secondary | ICD-10-CM

## 2023-07-24 DIAGNOSIS — N401 Enlarged prostate with lower urinary tract symptoms: Secondary | ICD-10-CM

## 2023-07-24 DIAGNOSIS — R350 Frequency of micturition: Secondary | ICD-10-CM | POA: Diagnosis not present

## 2023-07-24 DIAGNOSIS — N3040 Irradiation cystitis without hematuria: Secondary | ICD-10-CM

## 2023-07-24 DIAGNOSIS — Z87448 Personal history of other diseases of urinary system: Secondary | ICD-10-CM

## 2023-07-24 LAB — BLADDER SCAN AMB NON-IMAGING: Scan Result: 85

## 2023-07-24 MED ORDER — CIPROFLOXACIN HCL 500 MG PO TABS: 500.0000 mg | ORAL_TABLET | Freq: Once | ORAL | Status: DC

## 2023-07-24 NOTE — Progress Notes (Signed)
Bladder Scan completed today.  Patient cannot void prior to the bladder scan. Bladder scan result: 85  Performed By: Alfonse Spruce. CMA  Additional notes-
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
## 2023-08-07 ENCOUNTER — Inpatient Hospital Stay: Payer: 59

## 2023-08-08 ENCOUNTER — Ambulatory Visit (INDEPENDENT_AMBULATORY_CARE_PROVIDER_SITE_OTHER): Payer: Medicare Other

## 2023-08-08 ENCOUNTER — Inpatient Hospital Stay: Payer: 59 | Attending: Hematology

## 2023-08-08 DIAGNOSIS — Z87891 Personal history of nicotine dependence: Secondary | ICD-10-CM | POA: Insufficient documentation

## 2023-08-08 DIAGNOSIS — E039 Hypothyroidism, unspecified: Secondary | ICD-10-CM | POA: Diagnosis not present

## 2023-08-08 DIAGNOSIS — Z803 Family history of malignant neoplasm of breast: Secondary | ICD-10-CM | POA: Diagnosis not present

## 2023-08-08 DIAGNOSIS — I495 Sick sinus syndrome: Secondary | ICD-10-CM | POA: Diagnosis not present

## 2023-08-08 DIAGNOSIS — Z8041 Family history of malignant neoplasm of ovary: Secondary | ICD-10-CM | POA: Insufficient documentation

## 2023-08-08 DIAGNOSIS — C61 Malignant neoplasm of prostate: Secondary | ICD-10-CM | POA: Insufficient documentation

## 2023-08-08 DIAGNOSIS — E785 Hyperlipidemia, unspecified: Secondary | ICD-10-CM | POA: Diagnosis not present

## 2023-08-08 DIAGNOSIS — I252 Old myocardial infarction: Secondary | ICD-10-CM | POA: Insufficient documentation

## 2023-08-08 DIAGNOSIS — D649 Anemia, unspecified: Secondary | ICD-10-CM | POA: Diagnosis not present

## 2023-08-08 DIAGNOSIS — E119 Type 2 diabetes mellitus without complications: Secondary | ICD-10-CM | POA: Diagnosis not present

## 2023-08-08 DIAGNOSIS — I2510 Atherosclerotic heart disease of native coronary artery without angina pectoris: Secondary | ICD-10-CM | POA: Diagnosis not present

## 2023-08-08 DIAGNOSIS — N3091 Cystitis, unspecified with hematuria: Secondary | ICD-10-CM | POA: Diagnosis not present

## 2023-08-08 DIAGNOSIS — Z8249 Family history of ischemic heart disease and other diseases of the circulatory system: Secondary | ICD-10-CM | POA: Insufficient documentation

## 2023-08-08 DIAGNOSIS — Z79890 Hormone replacement therapy (postmenopausal): Secondary | ICD-10-CM | POA: Diagnosis not present

## 2023-08-08 DIAGNOSIS — R519 Headache, unspecified: Secondary | ICD-10-CM | POA: Insufficient documentation

## 2023-08-08 DIAGNOSIS — M8580 Other specified disorders of bone density and structure, unspecified site: Secondary | ICD-10-CM | POA: Insufficient documentation

## 2023-08-08 DIAGNOSIS — Z801 Family history of malignant neoplasm of trachea, bronchus and lung: Secondary | ICD-10-CM | POA: Diagnosis not present

## 2023-08-08 DIAGNOSIS — Z79899 Other long term (current) drug therapy: Secondary | ICD-10-CM | POA: Diagnosis not present

## 2023-08-08 DIAGNOSIS — Z833 Family history of diabetes mellitus: Secondary | ICD-10-CM | POA: Insufficient documentation

## 2023-08-08 DIAGNOSIS — D696 Thrombocytopenia, unspecified: Secondary | ICD-10-CM | POA: Diagnosis not present

## 2023-08-08 LAB — IRON AND TIBC
Iron: 39 ug/dL — ABNORMAL LOW (ref 45–182)
Saturation Ratios: 9 % — ABNORMAL LOW (ref 17.9–39.5)
TIBC: 460 ug/dL — ABNORMAL HIGH (ref 250–450)
UIBC: 421 ug/dL

## 2023-08-08 LAB — CBC WITH DIFFERENTIAL/PLATELET
Abs Immature Granulocytes: 0.02 10*3/uL (ref 0.00–0.07)
Basophils Absolute: 0.1 10*3/uL (ref 0.0–0.1)
Basophils Relative: 1 %
Eosinophils Absolute: 0.1 10*3/uL (ref 0.0–0.5)
Eosinophils Relative: 2 %
HCT: 34.4 % — ABNORMAL LOW (ref 39.0–52.0)
Hemoglobin: 10.4 g/dL — ABNORMAL LOW (ref 13.0–17.0)
Immature Granulocytes: 0 %
Lymphocytes Relative: 24 %
Lymphs Abs: 1.5 10*3/uL (ref 0.7–4.0)
MCH: 27.5 pg (ref 26.0–34.0)
MCHC: 30.2 g/dL (ref 30.0–36.0)
MCV: 91 fL (ref 80.0–100.0)
Monocytes Absolute: 0.7 10*3/uL (ref 0.1–1.0)
Monocytes Relative: 11 %
Neutro Abs: 3.8 10*3/uL (ref 1.7–7.7)
Neutrophils Relative %: 62 %
Platelets: 150 10*3/uL (ref 150–400)
RBC: 3.78 MIL/uL — ABNORMAL LOW (ref 4.22–5.81)
RDW: 15.7 % — ABNORMAL HIGH (ref 11.5–15.5)
WBC: 6.2 10*3/uL (ref 4.0–10.5)
nRBC: 0 % (ref 0.0–0.2)

## 2023-08-08 LAB — COMPREHENSIVE METABOLIC PANEL
ALT: 8 U/L (ref 0–44)
AST: 19 U/L (ref 15–41)
Albumin: 3.5 g/dL (ref 3.5–5.0)
Alkaline Phosphatase: 82 U/L (ref 38–126)
Anion gap: 9 (ref 5–15)
BUN: 11 mg/dL (ref 8–23)
CO2: 21 mmol/L — ABNORMAL LOW (ref 22–32)
Calcium: 8.9 mg/dL (ref 8.9–10.3)
Chloride: 108 mmol/L (ref 98–111)
Creatinine, Ser: 1.16 mg/dL (ref 0.61–1.24)
GFR, Estimated: >60 mL/min (ref >60)
Glucose, Bld: 121 mg/dL — ABNORMAL HIGH (ref 70–99)
Potassium: 4.1 mmol/L (ref 3.5–5.1)
Sodium: 138 mmol/L (ref 135–145)
Total Bilirubin: 0.6 mg/dL (ref 0.0–1.2)
Total Protein: 7.4 g/dL (ref 6.5–8.1)

## 2023-08-08 LAB — FERRITIN: Ferritin: 12 ng/mL — ABNORMAL LOW (ref 24–336)

## 2023-08-09 LAB — PSA: Prostatic Specific Antigen: <0.01 ng/mL (ref 0.00–4.00)

## 2023-08-11 ENCOUNTER — Encounter: Payer: Self-pay | Admitting: Internal Medicine

## 2023-08-11 LAB — CUP PACEART REMOTE DEVICE CHECK
Battery Remaining Longevity: 29 mo
Battery Remaining Percentage: 24 %
Battery Voltage: 2.92 V
Brady Statistic AP VP Percent: 5.3 %
Brady Statistic AP VS Percent: 29 %
Brady Statistic AS VP Percent: 1 %
Brady Statistic AS VS Percent: 64 %
Brady Statistic RA Percent Paced: 32 %
Brady Statistic RV Percent Paced: 6.3 %
Date Time Interrogation Session: 20250305020029
Implantable Lead Connection Status: 753985
Implantable Lead Connection Status: 753985
Implantable Lead Implant Date: 20160415
Implantable Lead Implant Date: 20160415
Implantable Lead Location: 753859
Implantable Lead Location: 753860
Implantable Pulse Generator Implant Date: 20160415
Lead Channel Impedance Value: 400 Ohm
Lead Channel Impedance Value: 580 Ohm
Lead Channel Pacing Threshold Amplitude: 0.75 V
Lead Channel Pacing Threshold Amplitude: 0.75 V
Lead Channel Pacing Threshold Pulse Width: 0.5 ms
Lead Channel Pacing Threshold Pulse Width: 0.5 ms
Lead Channel Sensing Intrinsic Amplitude: 12 mV
Lead Channel Sensing Intrinsic Amplitude: 2.4 mV
Lead Channel Setting Pacing Amplitude: 2 V
Lead Channel Setting Pacing Amplitude: 2.5 V
Lead Channel Setting Pacing Pulse Width: 0.5 ms
Lead Channel Setting Sensing Sensitivity: 2 mV
Pulse Gen Model: 2240
Pulse Gen Serial Number: 7756161

## 2023-08-14 ENCOUNTER — Inpatient Hospital Stay (HOSPITAL_BASED_OUTPATIENT_CLINIC_OR_DEPARTMENT_OTHER): Payer: 59 | Admitting: Hematology

## 2023-08-14 VITALS — BP 97/76 | HR 72 | Temp 98.0°F | Resp 18 | Wt 289.0 lb

## 2023-08-14 DIAGNOSIS — D649 Anemia, unspecified: Secondary | ICD-10-CM

## 2023-08-14 DIAGNOSIS — Z191 Hormone sensitive malignancy status: Secondary | ICD-10-CM | POA: Diagnosis not present

## 2023-08-14 DIAGNOSIS — C61 Malignant neoplasm of prostate: Secondary | ICD-10-CM

## 2023-08-14 NOTE — Patient Instructions (Addendum)
 [facility] at [facility] Discharge Instructions   You were seen and examined today by Dr. Ellin Saba.  He reviewed the results of your lab work which are mostly normal/stable. Your iron is low. We will arrange for you to have an iron infusion.  We will see you back in 3 months.   Return as scheduled.    Thank you for choosing [facility] at [facility] to provide your oncology and hematology care.  To afford each patient quality time with our provider, please arrive at least 15 minutes before your scheduled appointment time.   If you have a lab appointment with the Cancer Center please come in thru the Main Entrance and check in at the main information desk.  You need to re-schedule your appointment should you arrive 10 or more minutes late.  We strive to give you quality time with our providers, and arriving late affects you and other patients whose appointments are after yours.  Also, if you no show three or more times for appointments you may be dismissed from the clinic at the providers discretion.     Again, thank you for choosing [facility].  Our hope is that these requests will decrease the amount of time that you wait before being seen by our physicians.       _____________________________________________________________  Should you have questions after your visit to [facility], please contact our office at [phone] and follow the prompts.  Our office hours are 8:00 a.m. and 4:30 p.m. Monday - Friday.  Please note that voicemails left after 4:00 p.m. may not be returned until the following business day.  We are closed weekends and major holidays.  You do have access to a nurse 24-7, just call the main number to the clinic [phone] and do not press any options, hold on the line and a nurse will answer the phone.    For prescription refill requests, have your pharmacy contact our office and allow 72  hours.    Due to Covid, you will need to wear a mask upon entering the hospital. If you do not have a mask, a mask will be given to you at the Main Entrance upon arrival. For doctor visits, patients may have 1 support person age 73 or older with them. For treatment visits, patients can not have anyone with them due to social distancing guidelines and our immunocompromised population.

## 2023-08-14 NOTE — Progress Notes (Signed)
 [facility] [address], [place] [address]    Clinic Day:  08/14/2023  Referring physician: Fleet Contras, MD  Patient Care Team: Fleet Contras, MD as PCP - General (Internal Medicine) Marinus Maw, MD as PCP - Cardiology (Cardiology) Marinus Maw, MD (Cardiology) Early, [name], MD (Inactive) as Attending Physician (Vascular Surgery) [name], MD as Consulting Physician (Endocrinology) [name], MD (Inactive) as Consulting Physician (Cardiology) [name], [facility] (Inactive) as Pharmacist (Pharmacist) [name], RN as Oncology Nurse Navigator (Oncology) [name], MD as Medical Oncologist (Medical Oncology)   ASSESSMENT & PLAN:   Assessment: 1.  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 -Status post IMRT, 40 sessions completed on 06/14/2016 by Dr. Kathrynn Running. -PSA 0.7 (10/15/2018), 0.8 (02/18/2019), 1.4 (December 2020), 1.4 (June 2021) -Prostate biopsy on 02/10/2020-1 out of 12 cores from left mid lateral region positive for adenocarcinoma. -CTAP on 03/09/2020 with newly enlarged left iliac lymph nodes measuring 1.1 x 0.9 cm.  Hepatic steatosis with early signs of cirrhosis. -F-18 PSMA PET scan on 04/12/2020 showed radiotracer activity associated with enlarged left external iliac lymph node 9 mm with SUV 7.6.  Small left common iliac lymph node 6 mm, SUV 8.6.  Left periaortic lymph node 5 mm, SUV 6.3.  Lymph node between IVC and aorta at the level of right renal hilum, SUV 8.6.  Lymph node deep to the IVC measuring 6 mm, SUV 5.4.  No skeletal metastasis. 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 - Firmagon started on 04/21/2020 - Apalutamide 240 milligrams daily started around 04/22/2020.   2.  Social/family history: -He is a retired Product manager.  He lives at home with his wife.  He plays golf.  He quit smoking in 2015, smoked 1 pack/day for more than 20 years. -Sister has CML.  Maternal grandmother had breast cancer.  Maternal uncle had lung cancer.  2 maternal cousins had tongue cancer and uterine cancer.   3.  Cirrhosis: -Prior imaging showed cirrhosis of the liver with normal spleen.    Plan: 1.  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  Last Eligard 45 mg injection on 06/25/2023. - He has occasional headaches but denies any skin rashes. - Labs from 08/08/2023: Normal LFTs.  PSA is less than 0.01. - Continue apalutamide 240 mg daily until progression.  RTC 12 weeks for follow-up with repeat labs and PSA.   2.  Normocytic anemia: - His last Feraheme was in July 2024.  Hemoglobin decreased to 10.4 today.  Ferritin is low at 12 and percent saturation 9.  Denies any bleeding per rectum or melena. - Recommend INFeD 1 g IV x 1.  Discussed side effects including rare chance of anaphylactic reaction.   3.  Mild thrombocytopenia: - Mild thrombocytopenia on and off since December 2020 stable.  Current platelet count is normal at 150.   4.  Osteopenia (DEXA 11/15/2021 T score -1.6): - Continue vitamin D 50,000 units every other week. - We have also recommended Prolia.  He will have dental extractions done on 08/23/2023.  Will discuss at next visit.    Orders Placed This Encounter  Procedures   CBC with Differential    Standing Status:   Future    Expected Date:   11/05/2023    Expiration  Date:   08/13/2024   Comprehensive metabolic panel    Standing Status:   Future    Expected Date:   11/05/2023    Expiration Date:   08/13/2024   PSA    Standing Status:   Future    Expected Date:   11/05/2023    Expiration Date:   08/13/2024   Iron and TIBC ([facility] ONLY)    Standing Status:   Future    Expected Date:   11/05/2023     Expiration Date:   08/13/2024   Ferritin    Standing Status:   Future    Expected Date:   11/05/2023    Expiration Date:   08/13/2024      Mikeal Hawthorne R Teague,acting as a scribe for [name], MD.,have documented all relevant documentation on the behalf of [name], MD,as directed by  [name], MD while in the presence of [name], MD.  I, [name] MD, have reviewed the above documentation for accuracy and completeness, and I agree with the above.     [name], MD   3/11/20253:58 PM  CHIEF COMPLAINT:   Diagnosis: metastatic castration sensitive prostate cancer    Cancer Staging  No matching staging information was found for the patient.    Prior Therapy: IMRT x 40 sessions completed on 06/14/2016   Current Therapy:  Firmagon every month; Erleada 240 mg daily    HISTORY OF PRESENT ILLNESS:   Oncology History  Prostate cancer (HCC)  03/10/2016 Initial Diagnosis   Prostate cancer (HCC)   05/23/2020 Genetic Testing   Negative genetic testing:  No pathogenic variants detected on the [facility] Common Hereditary Cancers Panel + Prostate Cancer HRR Panel. A variant of uncertain significance (VUS) was detected in the NF1 gene called c.1178A>G. The report date is 05/23/2020.  The Common Hereditary Cancers Panel offered by [facility] includes sequencing and/or deletion duplication testing of the following 47 genes: APC, ATM, AXIN2, BARD1, BMPR1A, BRCA1, BRCA2, BRIP1, CDH1, CDK4, CDKN2A (p14ARF), CDKN2A (p16INK4a), CHEK2, CTNNA1, DICER1, EPCAM (Deletion/duplication testing only), GREM1 (promoter region deletion/duplication testing only), KIT, MEN1, MLH1, MSH2, MSH3, MSH6, MUTYH, NBN, NF1, NTHL1, PALB2, PDGFRA, PMS2, POLD1, POLE, PTEN, RAD50, RAD51C, RAD51D, SDHB, SDHC, SDHD, SMAD4, SMARCA4. STK11, TP53, TSC1, TSC2, and VHL.  The following genes were evaluated for sequence changes only: SDHA and HOXB13 c.251G>A variant only. The  Prostate Cancer HRR Panel offered by [facility] includes sequencing and/or deletion duplication analysis of the following 10 genes: ATM, BARD1, BRCA1, BRCA2, BRIP1, CHEK2, FANCL, PALB2, RAD51C, RAD51D.      INTERVAL HISTORY:   Peter Dunlap is a 68 y.o. male seen for follow-up of metastatic castrate sensitive prostate cancer.  He was last seen by me on 05/16/23.  Since his last visit, he was admitted to the hospital from 06/07/23 to 06/10/23 for bladder outlet obstruction with hematuria and suspected UTI after foley catheter placement 2 days prior. He was put on continuous bladder irrigation and ceftriaxone. He underwent outpatient cytoscopy with bladder biopsy under Dr. Retta Diones on 06/21/23.  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 His appetite level is at 100%. His energy level is at 70%. Bain notes tiredness. He denies any BRBPR or melena. He attributes low iron to blood from foley catheter placement with hematuria. Zyron will have dental extractions on 08/23/23. He denies seizures or joint pains due to [place]. He does note headaches, and is unsure if the  onset is prior to or after starting Erleada.  PAST MEDICAL HISTORY:   Past Medical History: Past Medical History:  Diagnosis Date   Ambulates with cane    Anemia    history of iron infusions in 2024   Arteriosclerotic cardiovascular disease (ASCVD) 2005   catheterization in 10/2010:50% mid LAD, diffuse distal disease, circumflex irregularities, large dominant RCA with a 50% ostial, 70% distal, 60% posterolateral and 70% PDA; normal EF   Arthritis    Benign prostatic hypertrophy    Bilateral carpal tunnel syndrome 07/03/2018   Cerebrovascular disease 2010   R. carotid endarterectomy; Duplex in 10/2010-widely patent ICAs, subtotal left vertebral-not thought to be contributing to symptoms   Cervical spine disease    CT in 2012-advanced  degeneration and spondylosis with moderate spinal stenosis at C3-C6   CHF (congestive heart failure) [facility])    Follows w/ cardiologist, Dr. Lewayne Bunting.   Depression    Diabetes mellitus without complication (HCC)    Takes [place].   Erectile dysfunction    Family history of breast cancer    Family history of cancer of mouth    Family history of CML (chronic monocytic leukemia)    Family history of lung cancer    Family history of ovarian cancer    Family history of stomach cancer    Full dentures    Wife states that he usually does not wear his dentures.   Gastroesophageal reflux disease    H/O hiatal hernia    H/O: substance abuse (HCC)    Cocaine, marijuana, alcohol.  Quit 2013.    Headache    Hyperlipidemia    Hypertension    Follows w/ cardiologist, Dr. Lewayne Bunting.   Hypothyroidism    Follows w/ Ronny Bacon, NP, endocrinology.   Non-ST elevation myocardial infarction (NSTEMI), initial episode of care [facility]) 12/02/2013   DES LAD   Obesity    Presence of permanent cardiac pacemaker    Prostate cancer (HCC)    Sleep apnea    CPAP   Tachy-brady syndrome (HCC)    a. 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 25, 2010    CEA   COLONOSCOPY WITH PROPOFOL N/A 06/24/2021   Procedure: COLONOSCOPY WITH PROPOFOL;  Surgeon: Jeani Hawking, MD;  Location: [facility] ENDOSCOPY;  Service: Endoscopy;  Laterality: N/A;   CORONARY ANGIOPLASTY WITH STENT PLACEMENT  12/03/2013   LAD 90%-->0% W/ Promus Premier DES 3.0 mm x 16 mm, CFX OK, RCA 40%, EF 70-75%    CYSTOSCOPY WITH FULGERATION N/A 06/21/2023   Procedure: CYSTOSCOPY WITH FULGERATION and clot evacuation;  Surgeon: Marcine Matar, MD;  Location: [facility];  Service: Urology;  Laterality: N/A;   LEFT ATRIAL APPENDAGE OCCLUSION N/A 08/05/2015   Procedure: LEFT ATRIAL APPENDAGE OCCLUSION;  Surgeon: [name], MD;  Location: [facility] INVASIVE CV LAB;  Service: Cardiovascular;  Laterality: N/A;   LEFT HEART CATH AND CORONARY ANGIOGRAPHY N/A 09/13/2021   Procedure: LEFT HEART CATH AND CORONARY ANGIOGRAPHY;  Surgeon:  Orbie Pyo, MD;  Location: [facility] INVASIVE CV LAB;  Service: Cardiovascular;  Laterality: N/A;   LEFT HEART CATHETERIZATION WITH CORONARY ANGIOGRAM Left 12/03/2013   Procedure: LEFT HEART CATHETERIZATION WITH CORONARY ANGIOGRAM;  Surgeon: Marykay Lex, MD;  Location: [facility] CATH LAB;  Service: Cardiovascular;  Laterality: Left;   LEFT HEART CATHETERIZATION WITH CORONARY ANGIOGRAM N/A 01/26/2014   Procedure: LEFT HEART CATHETERIZATION WITH CORONARY ANGIOGRAM;  Surgeon: Corky Crafts, MD;  Location: [facility] CATH LAB;  Service: Cardiovascular;  Laterality: N/A;   LEFT HEART CATHETERIZATION WITH CORONARY ANGIOGRAM N/A 08/03/2014   Procedure: LEFT HEART CATHETERIZATION WITH CORONARY ANGIOGRAM;  Surgeon: Kathleene Hazel, MD;  Location: [facility] CATH LAB;  Service: Cardiovascular;  Laterality: N/A;   PERCUTANEOUS CORONARY STENT INTERVENTION (PCI-S)  12/03/2013   Procedure: PERCUTANEOUS CORONARY STENT INTERVENTION (PCI-S);  Surgeon: Marykay Lex, MD;  Location: [facility] CATH LAB;  Service: Cardiovascular;;   PERMANENT PACEMAKER INSERTION N/A 09/18/2014   Procedure: PERMANENT PACEMAKER INSERTION;  Surgeon: Marinus Maw, MD;  Location[facility] CATH LAB;  Service: Cardiovascular;  Laterality: N/A;   POLYPECTOMY  06/24/2021   Procedure: POLYPECTOMY;  Surgeon: Jeani Hawking, MD;  Location: [facility] ENDOSCOPY;  Service: Endoscopy;;   RADIOFREQUENCY ABLATION  2005   for PSVT   TEE WITHOUT CARDIOVERSION N/A  07/27/2015   Procedure: TRANSESOPHAGEAL ECHOCARDIOGRAM (TEE);  Surgeon: Lewayne Bunting, MD;  Location: [facility] ENDOSCOPY;  Service: Cardiovascular;  Laterality: N/A;   TEE WITHOUT CARDIOVERSION N/A 09/15/2015   Procedure: TRANSESOPHAGEAL ECHOCARDIOGRAM (TEE);  Surgeon: Vesta Mixer, MD;  Location: [facility] ENDOSCOPY;  Service: Cardiovascular;  Laterality: N/A;    Social History: Social History   Socioeconomic History   Marital status: Married    Spouse name: Not on file   Number of children: 0   Years of education: Not on file   Highest education level: Not on file  Occupational History   Occupation: Retired  Tobacco Use   Smoking status: Former    Current packs/day: 0.00    Average packs/day: 1 pack/day for 40.0 years (40.0 ttl pk-yrs)    Types: Cigarettes    Start date: 10/20/1972    Quit date: 10/10/2012    Years since quitting: 10.8   Smokeless tobacco: Never   Tobacco comments:    Quit in May.   Vaping Use   Vaping status: Never Used  Substance and Sexual Activity   Alcohol use: Not Currently    Comment: former drinker-- sober since 2013.    Drug use: Not Currently    Types: Cocaine    Comment: quit cocaine 10/2011   Sexual activity: Yes    Partners: Female  Other Topics Concern   Not on file  Social History Narrative   Lives in [place].   Social Drivers of Corporate investment banker Strain: Low Risk  (04/19/2020)   Overall Financial Resource Strain (CARDIA)    Difficulty of Paying Living Expenses: Not hard at all  Food Insecurity: No Food Insecurity (06/08/2023)   Hunger Vital Sign    Worried About Running Out of Food in the Last Year: Never true    Ran Out of Food in the Last Year: Never true  Transportation Needs: No Transportation Needs (06/08/2023)   PRAPARE - Administrator, Civil Service (Medical): No    Lack of Transportation (Non-Medical): No  Physical Activity: Inactive (04/19/2020)   Exercise Vital Sign    Days of Exercise per Week: 0 days     Minutes of Exercise  per Session: 0 min  Stress: No Stress Concern Present (04/19/2020)   [employer] of Occupational Health - Occupational Stress Questionnaire    Feeling of Stress : Not at all  Social Connections: Socially Integrated (06/08/2023)   Social Connection and Isolation Panel [NHANES]    Frequency of Communication with Friends and Family: More than three times a week    Frequency of Social Gatherings with Friends and Family: Three times a week    Attends Religious Services: More than 4 times per year    Active Member of Clubs or Organizations: Yes    Attends Banker Meetings: More than 4 times per year    Marital Status: Married  Catering manager Violence: Not At Risk (06/08/2023)   Humiliation, Afraid, Rape, and Kick questionnaire    Fear of Current or Ex-Partner: No    Emotionally Abused: No    Physically Abused: No    Sexually Abused: No    Family History: Family History  Problem Relation Age of Onset   Hypertension Mother        Cerebrovascular disease   Diabetes Mother    Coronary artery disease Father 44   Diabetes type II Father    Hypertension Father    Heart attack Father    Diabetes Brother    Hypertension Brother    Diabetes Sister    Hypertension Sister    Heart attack Sister 22   Leukemia Sister 32       CML   Breast cancer Maternal Grandmother        dx 77s   Lung cancer Maternal Uncle        dx >50, smoker   Cancer Cousin        mouth cancer, dx 84s, no smoking/chew tobacco hx (maternal 1st cousin)   Ovarian cancer Cousin        dx <50 (maternal 1st cousin)   Stomach cancer Cousin        dx 87s (maternal 1st cousin)   Cancer Cousin        type unknown to pt, dx >50 (paternal 1st cousin)    Current Medications:  Current Outpatient Medications:    acetaminophen (TYLENOL) 500 MG tablet, Take 500-1,000 mg by mouth every 6 (six) hours as needed for moderate pain (pain score 4-6)., Disp: , Rfl:    allopurinol (ZYLOPRIM) 100 MG  tablet, TAKE 1 TABLET(100 MG) BY MOUTH DAILY, Disp: 90 tablet, Rfl: 0   apalutamide (ERLEADA) 60 MG tablet, TAKE 4 TABLETS (240 MG TOTAL) BY MOUTH DAILY. MAY BE TAKEN WITH OR WITHOUT FOOD. SWALLOW TABLETS WHOLE. (Patient taking differently: at bedtime.), Disp: 120 tablet, Rfl: 3   Cyanocobalamin (VITAMIN B 12) 500 MCG TABS, Take 500 mcg by mouth in the morning., Disp: , Rfl:    FARXIGA 5 MG TABS tablet, Take 1 tablet (5 mg total) by mouth daily., Disp: 90 tablet, Rfl: 3   finasteride (PROSCAR) 5 MG tablet, Take 5 mg by mouth daily., Disp: , Rfl:    fluticasone (FLONASE) 50 MCG/ACT nasal spray, Place 2 sprays into both nostrils daily. (Patient taking differently: Place 2 sprays into both nostrils daily as needed for allergies or rhinitis.), Disp: 16 g, Rfl: 6   isosorbide mononitrate (IMDUR) 30 MG 24 hr tablet, Take 1 tablet (30 mg total) by mouth daily., Disp: 90 tablet, Rfl: 3   levothyroxine (SYNTHROID) 200 MCG tablet, Take 1 tablet (200 mcg total) by mouth daily before breakfast., Disp: 90 tablet, Rfl: 3  levothyroxine (SYNTHROID) 25 MCG tablet, Take 1 tablet (25 mcg total) by mouth See admin instructions. Takes along with 200 mcg to total 225 mcg daily, Disp: 90 tablet, Rfl: 3   LINZESS 145 MCG CAPS capsule, Take 145 mcg by mouth every morning., Disp: , Rfl:    loratadine (CLARITIN) 10 MG tablet, TAKE 1 TABLET(10 MG) BY MOUTH DAILY AS NEEDED FOR ALLERGIES, Disp: 30 tablet, Rfl: 2   naloxone (NARCAN) nasal spray 4 mg/0.1 mL, Place 4 mg into the nose once as needed (accidental overdose)., Disp: , Rfl:    nitroGLYCERIN (NITROSTAT) 0.4 MG SL tablet, PLACE 1 TABLET UNDER THE TONGUE EVERY 5 MINUTES AS NEEDED FOR CHEST PAIN. CALL 911 AT THIRD DOSE IN 15 MINUTES, Disp: 25 tablet, Rfl: 11   ONETOUCH VERIO test strip, daily., Disp: , Rfl:    oxyCODONE-acetaminophen (PERCOCET) 10-325 MG tablet, Take 1 tablet by mouth every 8 (eight) hours as needed for pain., Disp: 90 tablet, Rfl: 0   pantoprazole  (PROTONIX) 40 MG tablet, TAKE 1 TABLET(40 MG) BY MOUTH DAILY, Disp: 30 tablet, Rfl: 3   phenazopyridine (PYRIDIUM) 100 MG tablet, Take 1 tablet (100 mg total) by mouth 3 (three) times daily with meals., Disp: 10 tablet, Rfl: 0   rosuvastatin (CRESTOR) 40 MG tablet, Take 40 mg by mouth every evening., Disp: , Rfl:    silodosin (RAPAFLO) 8 MG CAPS capsule, TAKE 1 CAPSULE BY MOUTH TWICE DAILY, Disp: 180 capsule, Rfl: 3   solifenacin (VESICARE) 10 MG tablet, TAKE 1 TABLET(10 MG) BY MOUTH DAILY, Disp: 30 tablet, Rfl: 11   sotalol (BETAPACE) 80 MG tablet, Take 1 tablet (80 mg total) by mouth 2 (two) times daily., Disp: 180 tablet, Rfl: 3   Vibegron (GEMTESA) 75 MG TABS, Take 75 mg by mouth every Monday, Wednesday, and Friday., Disp: , Rfl:    Vitamin D, Ergocalciferol, (DRISDOL) 1.25 MG (50000 UNIT) CAPS capsule, TAKE 1 CAPSULE BY MOUTH EVERY 7 DAYS (Patient taking differently: Take 50,000 Units by mouth every 14 (fourteen) days.), Disp: 12 capsule, Rfl: 0   Allergies: Allergies  Allergen Reactions   Lactose Intolerance (Gi) Other (See Comments)    UPSET STOMACH    Trazodone And Nefazodone     Nightmares    REVIEW OF SYSTEMS:   Review of Systems  Constitutional:  Negative for chills, fatigue and fever.  HENT:   Negative for lump/mass, mouth sores, nosebleeds, sore throat and trouble swallowing.   Eyes:  Negative for eye problems.  Respiratory:  Negative for cough and shortness of breath.   Cardiovascular:  Negative for chest pain, leg swelling and palpitations.  Gastrointestinal:  Positive for constipation. Negative for abdominal pain, diarrhea, nausea and vomiting.  Genitourinary:  Positive for dysuria. Negative for bladder incontinence, difficulty urinating, frequency, hematuria and nocturia.   Musculoskeletal:  Positive for back pain (6/10 severity). Negative for arthralgias, flank pain, myalgias and neck pain.       +elbow and bilateral knee pain, 6/10 severity  Skin:  Negative for  itching and rash.  Neurological:  Positive for dizziness, headaches and numbness (in left thigh).  Hematological:  Does not bruise/bleed easily.  Psychiatric/Behavioral:  Negative for depression, sleep disturbance and suicidal ideas. The patient is not nervous/anxious.   All other systems reviewed and are negative.    VITALS:   Blood pressure 97/76, pulse 72, temperature 98 F (36.7 C), temperature source Oral, resp. rate 18, weight 289 lb 0.4 oz (131.1 kg), SpO2 98%.  Wt Readings from Last 3  Encounters:  08/14/23 289 lb 0.4 oz (131.1 kg)  06/21/23 278 lb 4.8 oz (126.2 kg)  06/08/23 288 lb (130.6 kg)    Body mass index is 39.2 kg/m.  Performance status (ECOG): 1 - Symptomatic but completely ambulatory  PHYSICAL EXAM:   Physical Exam Vitals and nursing note reviewed. Exam conducted with a chaperone present.  Constitutional:      Appearance: Normal appearance.  Cardiovascular:     Rate and Rhythm: Normal rate and regular rhythm.     Pulses: Normal pulses.     Heart sounds: Normal heart sounds.  Pulmonary:     Effort: Pulmonary effort is normal.     Breath sounds: Normal breath sounds.  Abdominal:     Palpations: Abdomen is soft. There is no hepatomegaly, splenomegaly or mass.     Tenderness: There is no abdominal tenderness.  Musculoskeletal:     Right lower leg: No edema.     Left lower leg: No edema.  Lymphadenopathy:     Cervical: No cervical adenopathy.     Right cervical: No superficial, deep or posterior cervical adenopathy.    Left cervical: No superficial, deep or posterior cervical adenopathy.     Upper Body:     Right upper body: No supraclavicular or axillary adenopathy.     Left upper body: No supraclavicular or axillary adenopathy.  Neurological:     General: No focal deficit present.     Mental Status: He is alert and oriented to person, place, and time.  Psychiatric:        Mood and Affect: Mood normal.        Behavior: Behavior normal.     LABS:       Latest Ref Rng & Units 08/08/2023   12:45 PM 06/21/2023   10:44 AM 06/10/2023   12:39 PM  CBC  WBC 4.0 - 10.5 K/uL 6.2   11.0   Hemoglobin 13.0 - 17.0 g/dL 96.0  45.4  09.8   Hematocrit 39.0 - 52.0 % 34.4  34.0  37.2   Platelets 150 - 400 K/uL 150   115       Latest Ref Rng & Units 08/08/2023   12:45 PM 06/21/2023   10:44 AM 06/10/2023   12:39 PM  CMP  Glucose 70 - 99 mg/dL 119  147  829   BUN 8 - 23 mg/dL 11  9  14    Creatinine 0.61 - 1.24 mg/dL 5.62  1.30  8.65   Sodium 135 - 145 mmol/L 138  139  138   Potassium 3.5 - 5.1 mmol/L 4.1  4.2  3.6   Chloride 98 - 111 mmol/L 108  109  109   CO2 22 - 32 mmol/L 21   17   Calcium 8.9 - 10.3 mg/dL 8.9   9.1   Total Protein 6.5 - 8.1 g/dL 7.4     Total Bilirubin 0.0 - 1.2 mg/dL 0.6     Alkaline Phos 38 - 126 U/L 82     AST 15 - 41 U/L 19     ALT 0 - 44 U/L 8        No results found for: "CEA1", "CEA" / No results found for: "CEA1", "CEA" No results found for: "PSA1" No results found for: "HQI696" No results found for: "CAN125"  No results found for: "TOTALPROTELP", "ALBUMINELP", "A1GS", "A2GS", "BETS", "BETA2SER", "GAMS", "MSPIKE", "SPEI" Lab Results  Component Value Date   TIBC 460 (H) 08/08/2023   TIBC 348 02/12/2023  TIBC 407 11/20/2022   FERRITIN 12 (L) 08/08/2023   FERRITIN 153 02/12/2023   FERRITIN 44 11/20/2022   IRONPCTSAT 9 (L) 08/08/2023   IRONPCTSAT 25 02/12/2023   IRONPCTSAT 15 (L) 11/20/2022   No results found for: "LDH"   STUDIES:   CUP PACEART REMOTE DEVICE CHECK Result Date: 08/11/2023 Scheduled remote reviewed. Normal device function.  1 AHR detection, 24 sec.  Known history of PAF, Watchman per Epic. Next remote 91 days. - CS, CVRS

## 2023-08-14 NOTE — Progress Notes (Signed)
Patient is taking Erleada as prescribed.  He has not missed any doses and reports no side effects at this time.   

## 2023-08-16 LAB — COMPREHENSIVE METABOLIC PANEL
ALT: 6 IU/L (ref 0–44)
AST: 15 IU/L (ref 0–40)
Albumin: 4.1 g/dL (ref 3.9–4.9)
Alkaline Phosphatase: 109 IU/L (ref 44–121)
BUN/Creatinine Ratio: 11 (ref 10–24)
BUN: 13 mg/dL (ref 8–27)
Bilirubin Total: 0.5 mg/dL (ref 0.0–1.2)
CO2: 18 mmol/L — ABNORMAL LOW (ref 20–29)
Calcium: 9.3 mg/dL (ref 8.6–10.2)
Chloride: 107 mmol/L — ABNORMAL HIGH (ref 96–106)
Creatinine, Ser: 1.15 mg/dL (ref 0.76–1.27)
Globulin, Total: 3.1 g/dL (ref 1.5–4.5)
Glucose: 160 mg/dL — ABNORMAL HIGH (ref 70–99)
Potassium: 4.2 mmol/L (ref 3.5–5.2)
Sodium: 140 mmol/L (ref 134–144)
Total Protein: 7.2 g/dL (ref 6.0–8.5)
eGFR: 70 mL/min/{1.73_m2} (ref >59)

## 2023-08-16 LAB — TSH: TSH: 6.03 u[IU]/mL — ABNORMAL HIGH (ref 0.450–4.500)

## 2023-08-16 LAB — T4, FREE: Free T4: 1.56 ng/dL (ref 0.82–1.77)

## 2023-08-17 ENCOUNTER — Other Ambulatory Visit: Payer: Self-pay

## 2023-08-17 NOTE — Progress Notes (Signed)
 Specialty Pharmacy Refill Coordination Note  Peter Dunlap is a 68 y.o. male contacted today regarding refills of specialty medication(s) Apalutamide Lavone Neri)   Spoke with patient's wife Peter Dunlap  Patient requested Delivery   Delivery date: 08/27/23   Verified address: [address]   [place] [address]   Medication will be filled on 03.21.25.

## 2023-08-21 ENCOUNTER — Ambulatory Visit: Admitting: Urology

## 2023-08-21 ENCOUNTER — Ambulatory Visit: Payer: 59 | Admitting: Urology

## 2023-08-21 ENCOUNTER — Telehealth: Payer: Self-pay

## 2023-08-21 VITALS — BP 110/75 | HR 75

## 2023-08-21 DIAGNOSIS — Z191 Hormone sensitive malignancy status: Secondary | ICD-10-CM

## 2023-08-21 DIAGNOSIS — C61 Malignant neoplasm of prostate: Secondary | ICD-10-CM

## 2023-08-21 DIAGNOSIS — R310 Gross hematuria: Secondary | ICD-10-CM

## 2023-08-21 DIAGNOSIS — N3040 Irradiation cystitis without hematuria: Secondary | ICD-10-CM | POA: Diagnosis not present

## 2023-08-21 DIAGNOSIS — Z79899 Other long term (current) drug therapy: Secondary | ICD-10-CM | POA: Diagnosis not present

## 2023-08-21 DIAGNOSIS — N401 Enlarged prostate with lower urinary tract symptoms: Secondary | ICD-10-CM

## 2023-08-21 DIAGNOSIS — M8580 Other specified disorders of bone density and structure, unspecified site: Secondary | ICD-10-CM

## 2023-08-21 NOTE — Telephone Encounter (Signed)
 Called Pt to see if Pt could reschedule their appt due to MD having a emergency surgery Pt states okay to reschedule however Pt needed appt per MD so we kept it

## 2023-08-21 NOTE — Progress Notes (Signed)
 History of Present Illness: Peter Dunlap is here today for follow-up of prostate cancer and BPH w/ LUTS:    Prostate cancer:    He underwent TRUS/Bx on 8.15.2017. At that time, PSA was 9.09. Prostatic volume was 25 cc. 10/12 cores came back positive for adenocarcinoma as follows: 1 core revealed GS 3+3 5 cores revealed GS 3+4 4 cores revealed GS 4+3   He completed IMRT, 40 sessions, on 1.10.2018   8.24.2021: Repeat TRUS/Bx for elevating PSA trend following definitive Rx. 1/12 cores-from the left mid lateral region-came back positive for adenocarcinoma.  Grading was difficult due to his prior radiation.     10.15.2021: -CT A/P  with newly enlarged left iliac lymph nodes measuring 1.1 x 0.9 cm.  Hepatic steatosis with early signs of cirrhosis.   04/12/2020 -F-18 PSMA PET scan on showed radiotracer activity associated with enlarged left external iliac lymph node 9 mm with SUV 7.6.  Small left common iliac lymph node 6 mm, SUV 8.6.  Left periaortic lymph node 5 mm, SUV 6.3.  Lymph node between IVC and aorta at the level of right renal hilum, SUV 8.6.  Lymph node deep to the IVC measuring 6 mm, SUV 5.4.  No skeletal metastasis.   11.17.2021: Began ADT with [place].   7.5.2022: Continues Firmagon and Erleada  Most recent PSA is less than 0.1.  Testosterone level 5.   11.9.2022: PSA < 0.01. T level castrate.  Continues [place] and [place]   7.18.2023: PSA is 0.01, testosterone level still castrate.  Continues on [place].    11.21.2023: Most recent PSA in September less than 0.01, nadir level for him.  He did experience hematuria in September-passed a clot,   5.21.2024: PSA 2 months ago was still undetectable.  Still with urinary frequency, urgency and urgency incontinence as well as nocturia.  On Rapaflo twice a day as well as Solifenacin 10 mg.   8.7.2024:  He was switched to leuprolide 45 mg from his monthly Firmagon on the 18th of last month.  He is much more  comfortable with this.  Most recent PSA was undetectable.  At his last visit he was started on Gemtesa in addition to his Solifenacin.    11.11.2024: He follows regularly with Dr. Ellin Saba.  He is on leuprolide 45 mg every 6 months, last given in July.  Also on apalutamide and Prolia.  Most recent PSA 2 months ago is less than 0.1.  He is having no hot flashes.  He takes vitamin D supplements.  He does get some exercise-walking on a regular basis.  No bony pain.     BPH   11.12.2024: He denies significant lower urinary tract symptoms.  He is having no blood in his urine.  He is on Solifenacin and alfuzosin   Gross hematuria:  He underwent cystoscopy, bladder biopsy and fulguration of bleeders on 06/21/2023.  Bladder biopsies revealed benign findings.  He has not noticed any blood in his urine since the procedure.  He is having spraying with urination.  He is having worsening urgency incontinence.      3.18.2025: Here today for check.  He has had no real urinary issues since his last visit.  He is temporarily off of aspirin as he will be having all of his teeth pulled in the near future.  He has a good stream.  He feels like he empties well.  He has not noted blood in his urine.  His last 45 mg leuprolide injection was  on 20 January.   Past Surgical History:  Procedure Laterality Date   BRAIN SURGERY  2015   hematoma evacuation   BURR HOLE Right 04/13/2014   Procedure: Ezekiel Ina;  Surgeon: Temple Pacini, MD;  Location: [facility];  Service: Neurosurgery;  Laterality: Right;   CAROTID ENDARTERECTOMY Right Feb. 25, 2010    CEA   COLONOSCOPY WITH PROPOFOL N/A 06/24/2021   Procedure: COLONOSCOPY WITH PROPOFOL;  Surgeon: Jeani Hawking, MD;  Location: [facility];  Service: Endoscopy;  Laterality: N/A;   CORONARY ANGIOPLASTY WITH STENT PLACEMENT  12/03/2013   LAD 90%-->0% W/ Promus Premier DES 3.0 mm x 16 mm, CFX OK, RCA 40%, EF 70-75%   CYSTOSCOPY WITH FULGERATION N/A 06/21/2023    Procedure: CYSTOSCOPY WITH FULGERATION and clot evacuation;  Surgeon: Marcine Matar, MD;  Location: [facility];  Service: Urology;  Laterality: N/A;   LEFT ATRIAL APPENDAGE OCCLUSION N/A 08/05/2015   Procedure: LEFT ATRIAL APPENDAGE OCCLUSION;  Surgeon: [name], MD;  Location: [facility];  Service: Cardiovascular;  Laterality: N/A;   LEFT HEART CATH AND CORONARY ANGIOGRAPHY N/A 09/13/2021   Procedure: LEFT HEART CATH AND CORONARY ANGIOGRAPHY;  Surgeon: Orbie Pyo, MD;  Location: [facility];  Service: Cardiovascular;  Laterality: N/A;   LEFT HEART CATHETERIZATION WITH CORONARY ANGIOGRAM Left 12/03/2013   Procedure: LEFT HEART CATHETERIZATION WITH CORONARY ANGIOGRAM;  Surgeon: Marykay Lex, MD;  Location: [facility] CATH LAB;  Service: Cardiovascular;  Laterality: Left;   LEFT HEART CATHETERIZATION WITH CORONARY ANGIOGRAM N/A 01/26/2014   Procedure: LEFT HEART CATHETERIZATION WITH CORONARY ANGIOGRAM;  Surgeon: Corky Crafts, MD;  Location: [facility] CATH LAB;  Service: Cardiovascular;  Laterality: N/A;   LEFT HEART CATHETERIZATION WITH CORONARY ANGIOGRAM N/A 08/03/2014   Procedure: LEFT HEART CATHETERIZATION WITH CORONARY ANGIOGRAM;  Surgeon: Kathleene Hazel, MD;  Location: [facility] CATH LAB;  Service: Cardiovascular;  Laterality: N/A;   PERCUTANEOUS CORONARY STENT INTERVENTION (PCI-S)  12/03/2013   Procedure: PERCUTANEOUS CORONARY STENT INTERVENTION (PCI-S);  Surgeon: Marykay Lex, MD;  Location: [facility] CATH LAB;  Service: Cardiovascular;;   PERMANENT PACEMAKER INSERTION N/A 09/18/2014   Procedure: PERMANENT PACEMAKER INSERTION;  Surgeon: Marinus Maw, MD;  Location: [facility] CATH LAB;  Service: Cardiovascular;  Laterality: N/A;   POLYPECTOMY  06/24/2021   Procedure: POLYPECTOMY;  Surgeon: Jeani Hawking, MD;  Location: [facility] ENDOS[facility];  Service: Endoscopy;;   RADIOFREQUENCY ABLATION  2005   for PSVT   TEE WITHOUT CARDIOVERSION N/A 07/27/2015   Procedure: TRANSESOPHAGEAL  ECHOCARDIOGRAM (TEE);  Surgeon: Lewayne Bunting, MD;  Location: [facility] ENDOSCOPY;  Service: Cardiovascular;  Laterality: N/A;   TEE WITHOUT CARDIOVERSION N/A 09/15/2015   Procedure: TRANSESOPHAGEAL ECHOCARDIOGRAM (TEE);  Surgeon: Vesta Mixer, MD;  Location: [facility] ENDOSCOPY;  Service: Cardiovascular;  Laterality: N/A;    Home Medications:  Allergies as of 08/21/2023       Reactions   Lactose Intolerance (gi) Other (See Comments)   UPSET STOMACH   Trazodone And Nefazodone    Nightmares        Medication List        Accurate as of August 21, 2023 11:01 AM. If you have any questions, ask your nurse or doctor.          acetaminophen 500 MG tablet Commonly known as: TYLENOL Take 500-1,000 mg by mouth every 6 (six) hours as needed for moderate pain (pain score 4-6).   allopurinol 100 MG tablet Commonly known as: ZYLOPRIM TAKE 1 TABLET(100 MG)  BY MOUTH DAILY   Erleada 60 MG tablet Generic drug: apalutamide TAKE 4 TABLETS (240 MG TOTAL) BY MOUTH DAILY. MAY BE TAKEN WITH OR WITHOUT FOOD. SWALLOW TABLETS WHOLE. What changed: when to take this   Farxiga 5 MG Tabs tablet Generic drug: dapagliflozin propanediol Take 1 tablet (5 mg total) by mouth daily.   finasteride 5 MG tablet Commonly known as: PROSCAR Take 5 mg by mouth daily.   fluticasone 50 MCG/ACT nasal spray Commonly known as: FLONASE Place 2 sprays into both nostrils daily. What changed:  when to take this reasons to take this   Gemtesa 75 MG Tabs Generic drug: Vibegron Take 75 mg by mouth every Monday, Wednesday, and Friday.   isosorbide mononitrate 30 MG 24 hr tablet Commonly known as: IMDUR Take 1 tablet (30 mg total) by mouth daily.   levothyroxine 25 MCG tablet Commonly known as: SYNTHROID Take 1 tablet (25 mcg total) by mouth See admin instructions. Takes along with 200 mcg to total 225 mcg daily   levothyroxine 200 MCG tablet Commonly known as: SYNTHROID Take 1 tablet (200 mcg total) by mouth daily  before breakfast.   Linzess 145 MCG Caps capsule Generic drug: linaclotide Take 145 mcg by mouth every morning.   loratadine 10 MG tablet Commonly known as: CLARITIN TAKE 1 TABLET(10 MG) BY MOUTH DAILY AS NEEDED FOR ALLERGIES   naloxone 4 MG/0.1ML Liqd nasal spray kit Commonly known as: NARCAN Place 4 mg into the nose once as needed (accidental overdose).   nitroGLYCERIN 0.4 MG SL tablet Commonly known as: NITROSTAT PLACE 1 TABLET UNDER THE TONGUE EVERY 5 MINUTES AS NEEDED FOR CHEST PAIN. CALL 911 AT THIRD DOSE IN 15 MINUTES   OneTouch Verio test strip Generic drug: glucose blood daily.   oxyCODONE-acetaminophen 10-325 MG tablet Commonly known as: PERCOCET Take 1 tablet by mouth every 8 (eight) hours as needed for pain.   pantoprazole 40 MG tablet Commonly known as: PROTONIX TAKE 1 TABLET(40 MG) BY MOUTH DAILY   phenazopyridine 100 MG tablet Commonly known as: PYRIDIUM Take 1 tablet (100 mg total) by mouth 3 (three) times daily with meals.   rosuvastatin 40 MG tablet Commonly known as: CRESTOR Take 40 mg by mouth every evening.   silodosin 8 MG Caps capsule Commonly known as: RAPAFLO TAKE 1 CAPSULE BY MOUTH TWICE DAILY   solifenacin 10 MG tablet Commonly known as: VESICARE TAKE 1 TABLET(10 MG) BY MOUTH DAILY   sotalol 80 MG tablet Commonly known as: BETAPACE Take 1 tablet (80 mg total) by mouth 2 (two) times daily.   Vitamin B 12 500 MCG Tabs Take 500 mcg by mouth in the morning.   Vitamin D (Ergocalciferol) 1.25 MG (50000 UNIT) Caps capsule Commonly known as: DRISDOL TAKE 1 CAPSULE BY MOUTH EVERY 7 DAYS What changed: when to take this        Allergies:  Allergies  Allergen Reactions   Lactose Intolerance (Gi) Other (See Comments)    UPSET STOMACH    Trazodone And Nefazodone     Nightmares    Family History  Problem Relation Age of Onset   Hypertension Mother        Cerebrovascular disease   Diabetes Mother    Coronary artery disease Father  79   Diabetes type II Father    Hypertension Father    Heart attack Father    Diabetes Brother    Hypertension Brother    Diabetes Sister    Hypertension Sister    Heart  attack Sister 24   Leukemia Sister 6       CML   Breast cancer Maternal Grandmother        dx 36s   Lung cancer Maternal Uncle        dx >50, smoker   Cancer Cousin        mouth cancer, dx 61s, no smoking/chew tobacco hx (maternal 1st cousin)   Ovarian cancer Cousin        dx <50 (maternal 1st cousin)   Stomach cancer Cousin        dx 68s (maternal 1st cousin)   Cancer Cousin        type unknown to pt, dx >50 (paternal 1st cousin)    Social History:  reports that he quit smoking about 10 years ago. His smoking use included cigarettes. He started smoking about 50 years ago. He has a 40 pack-year smoking history. He has never used smokeless tobacco. He reports that he does not currently use alcohol. He reports that he does not currently use drugs after having used the following drugs: Cocaine.  ROS: A complete review of systems was performed.  All systems are negative except for pertinent findings as noted.  Physical Exam:  Vital signs in last 24 hours: BP 110/75   Pulse 75  Constitutional:  Alert and oriented, No acute distress Cardiovascular: Regular rate  Respiratory: Normal respiratory effort GU: Meatal stenosis noted Neurologic: Grossly intact, no focal deficits Psychiatric: Normal mood and affect   I have reviewed prior pt notes  I have reviewed notes from referring/previous physicians--Dr. Loreli Slot last note reviewed  I have independently reviewed prior imaging--bone density study from June 2023 revealed osteopenia  I have reviewed prior PSA and recent biopsy results    Impression/Assessment:  1.  Castrate sensitive metastatic prostate cancer, on leuprolide, apalutamide with excellent PSA response that is durable  2.  Osteopenia, on Prolia  3.  BPH with lower urinary tract symptoms,  on silodosin, Gemtesa and Solifenacin, still doing well 4.  Recent gross hematuria, secondary to radiation cystitis, he underwent cystoscopy and fulguration.  Urine has been clear  5.  Meatal stenosis, he is voiding fairly well  Plan: 1.  Continue all medical therapy per Dr. Ellin Saba  2.  I will see back in about 4 months for routine check

## 2023-08-22 ENCOUNTER — Encounter: Payer: Self-pay | Admitting: Nurse Practitioner

## 2023-08-22 ENCOUNTER — Ambulatory Visit (INDEPENDENT_AMBULATORY_CARE_PROVIDER_SITE_OTHER): Payer: 59 | Admitting: Nurse Practitioner

## 2023-08-22 VITALS — BP 98/70 | HR 72 | Ht 72.0 in | Wt 284.2 lb

## 2023-08-22 DIAGNOSIS — Z7984 Long term (current) use of oral hypoglycemic drugs: Secondary | ICD-10-CM | POA: Diagnosis not present

## 2023-08-22 DIAGNOSIS — E1169 Type 2 diabetes mellitus with other specified complication: Secondary | ICD-10-CM

## 2023-08-22 DIAGNOSIS — E890 Postprocedural hypothyroidism: Secondary | ICD-10-CM

## 2023-08-22 DIAGNOSIS — E559 Vitamin D deficiency, unspecified: Secondary | ICD-10-CM

## 2023-08-22 DIAGNOSIS — I10 Essential (primary) hypertension: Secondary | ICD-10-CM

## 2023-08-22 DIAGNOSIS — E669 Obesity, unspecified: Secondary | ICD-10-CM

## 2023-08-22 DIAGNOSIS — E782 Mixed hyperlipidemia: Secondary | ICD-10-CM

## 2023-08-22 LAB — POCT GLYCOSYLATED HEMOGLOBIN (HGB A1C): HbA1c, POC (controlled diabetic range): 6.5 % (ref 0.0–7.0)

## 2023-08-22 MED ORDER — FARXIGA 5 MG PO TABS
5.0000 mg | ORAL_TABLET | Freq: Every day | ORAL | 3 refills | Status: DC
Start: 2023-08-22 — End: 2024-04-28

## 2023-08-22 MED ORDER — LEVOTHYROXINE SODIUM 200 MCG PO TABS: 200.0000 ug | ORAL_TABLET | Freq: Every day | ORAL | 3 refills | Status: DC

## 2023-08-22 MED ORDER — LEVOTHYROXINE SODIUM 25 MCG PO TABS: 25.0000 ug | ORAL_TABLET | ORAL | 3 refills | Status: DC

## 2023-08-22 NOTE — Patient Instructions (Signed)

## 2023-08-22 NOTE — Progress Notes (Signed)
 08/22/2023                                                 Endocrinology Follow Up  Subjective:    Patient ID: Peter Dunlap, male    DOB: March 21, 1956, PCP Fleet Contras, MD  Patient presents today for follow-up of hypothyroidism following radioactive iodine treatment, and type 2 diabetes.  Past Medical History:  Diagnosis Date   Ambulates with cane    Anemia    history of iron infusions in 2024   Arteriosclerotic cardiovascular disease (ASCVD) 2005   catheterization in 10/2010:50% mid LAD, diffuse distal disease, circumflex irregularities, large dominant RCA with a 50% ostial, 70% distal, 60% posterolateral and 70% PDA; normal EF   Arthritis    Benign prostatic hypertrophy    Bilateral carpal tunnel syndrome 07/03/2018   Cerebrovascular disease 2010   R. carotid endarterectomy; Duplex in 10/2010-widely patent ICAs, subtotal left vertebral-not thought to be contributing to symptoms   Cervical spine disease    CT in 2012-advanced degeneration and spondylosis with moderate spinal stenosis at C3-C6   CHF (congestive heart failure) [facility])    Follows w/ cardiologist, Dr. Lewayne Bunting.   Depression    Diabetes mellitus without complication (HCC)    Takes [place].   Erectile dysfunction    Family history of breast cancer    Family history of cancer of mouth    Family history of CML (chronic monocytic leukemia)    Family history of lung cancer    Family history of ovarian cancer    Family history of stomach cancer    Full dentures    Wife states that he usually does not wear his dentures.   Gastroesophageal reflux disease    H/O hiatal hernia    H/O: substance abuse (HCC)    Cocaine, marijuana, alcohol.  Quit 2013.    Headache    Hyperlipidemia    Hypertension    Follows w/ cardiologist, Dr. Lewayne Bunting.   Hypothyroidism    Follows w/ Ronny Bacon, NP, endocrinology.   Non-ST elevation myocardial infarction (NSTEMI), initial episode of care [facility]) 12/02/2013   DES LAD    Obesity    Presence of permanent cardiac pacemaker    Prostate cancer (HCC)    Sleep apnea    CPAP   Tachy-brady syndrome (HCC)    a. 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 25, 2010    CEA   COLONOSCOPY WITH PROPOFOL N/A 06/24/2021   Procedure: COLONOSCOPY WITH PROPOFOL;  Surgeon: Jeani Hawking, MD;  Location: [facility] ENDOS[facility];  Service: Endoscopy;  Laterality: N/A;   CORONARY ANGIOPLASTY WITH STENT PLACEMENT  12/03/2013   LAD 90%-->0% W/ Promus Premier DES 3.0 mm x 16 mm, CFX OK, RCA 40%, EF 70-75%   CYSTOSCOPY WITH FULGERATION N/A 06/21/2023   Procedure: CYSTOSCOPY WITH FULGERATION and clot evacuation;  Surgeon:  Marcine Matar, MD;  Location: [facility];  Service: Urology;  Laterality: N/A;   LEFT ATRIAL APPENDAGE OCCLUSION N/A 08/05/2015   Procedure: LEFT ATRIAL APPENDAGE OCCLUSION;  Surgeon: [name], MD;  [facility] INV[facility];  Service: Cardiovascular;  Laterality: N/A;   LEFT HEART CATH AND CORONARY ANGIOGRAPHY N/A 09/13/2021   Procedure: LEFT HEART CATH AND CORONARY ANGIOGRAPHY;  Surgeon: Orbie Pyo, MD;  [facility];  Service: Cardiovascular;  Laterality: N/A;   LEFT HEART CATHETERIZATION WITH CORONARY ANGIOGRAM Left 12/03/2013   Procedure: LEFT HEART CATHETERIZATION WITH CORONARY ANGIOGRAM;  Surgeon: Marykay Lex, MD;  Location: [facility] CATH LAB;  Service: Cardiovascular;  Laterality: Left;   LEFT HEART CATHETERIZATION WITH CORONARY ANGIOGRAM N/A 01/26/2014   Procedure: LEFT HEART CATHETERIZATION WITH CORONARY ANGIOGRAM;   Surgeon: Corky Crafts, MD;  Location: [facility] CATH LAB;  Service: Cardiovascular;  Laterality: N/A;   LEFT HEART CATHETERIZATION WITH CORONARY ANGIOGRAM N/A 08/03/2014   Procedure: LEFT HEART CATHETERIZATION WITH CORONARY ANGIOGRAM;  Surgeon: Kathleene Hazel, MD;  Location: [facility] CATH LAB;  Service: Cardiovascular;  Laterality: N/A;   PERCUTANEOUS CORONARY STENT INTERVENTION (PCI-S)  12/03/2013   Procedure: PERCUTANEOUS CORONARY STENT INTERVENTION (PCI-S);  Surgeon: Marykay Lex, MD;  Location: [facility] CATH LAB;  Service: Cardiovascular;;   PERMANENT PACEMAKER INSERTION N/A 09/18/2014   Procedure: PERMANENT PACEMAKER INSERTION;  Surgeon: Marinus Maw, MD;  Location: [facility] CATH LAB;  Service: Cardiovascular;  Laterality: N/A;   POLYPECTOMY  06/24/2021   Procedure: POLYPECTOMY;  Surgeon: Jeani Hawking, MD;  [facility];  Service: Endoscopy;;   RADIOFREQUENCY ABLATION  2005   for PSVT   TEE WITHOUT CARDIOVERSION N/A 07/27/2015   Procedure: TRANSESOPHAGEAL ECHOCARDIOGRAM (TEE);  Surgeon: Lewayne Bunting, MD;  Location: [facility] ENDOSCOPY;  Service: Cardiovascular;  Laterality: N/A;   TEE WITHOUT CARDIOVERSION N/A 09/15/2015   Procedure: TRANSESOPHAGEAL ECHOCARDIOGRAM (TEE);  Surgeon: Vesta Mixer, MD;  Location: [facility] ENDOSCOPY;  Service: Cardiovascular;  Laterality: N/A;   Social History   Socioeconomic History   Marital status: Married    Spouse name: Not on file   Number of children: 0   Years of education: Not on file   Highest education level: Not on file  Occupational History   Occupation: Retired  Tobacco Use   Smoking status: Former    Current packs/day: 0.00    Average packs/day: 1 pack/day for 40.0 years (40.0 ttl pk-yrs)    Types: Cigarettes    Start date: 10/20/1972    Quit date: 10/10/2012    Years since quitting: 10.8   Smokeless tobacco: Never   Tobacco comments:    Quit in May.   Vaping Use   Vaping status: Never Used  Substance and Sexual Activity   Alcohol use:  Not Currently    Comment: former drinker-- sober since 2013.    Drug use: Not Currently    Types: Cocaine    Comment: quit cocaine 10/2011   Sexual activity: Yes    Partners: Female  Other Topics Concern   Not on file  Social History Narrative   Lives in [place].   Social Drivers of Corporate investment banker Strain: Low Risk  (04/19/2020)   Overall Financial Resource Strain (CARDIA)    Difficulty of Paying Living Expenses: Not hard at all  Food Insecurity: No Food Insecurity (06/08/2023)   Hunger Vital Sign    Worried About Running Out of Food in the Last Year: Never true  Ran Out of Food in the Last Year: Never true  Transportation Needs: No Transportation Needs (06/08/2023)   PRAPARE - Administrator, Civil Service (Medical): No    Lack of Transportation (Non-Medical): No  Physical Activity: Inactive (04/19/2020)   Exercise Vital Sign    Days of Exercise per Week: 0 days    Minutes of Exercise per Session: 0 min  Stress: No Stress Concern Present (04/19/2020)   [employer] of Occupational Health - Occupational Stress Questionnaire    Feeling of Stress : Not at all  Social Connections: Socially Integrated (06/08/2023)   Social Connection and Isolation Panel [NHANES]    Frequency of Communication with Friends and Family: More than three times a week    Frequency of Social Gatherings with Friends and Family: Three times a week    Attends Religious Services: More than 4 times per year    Active Member of Clubs or Organizations: Yes    Attends Banker Meetings: More than 4 times per year    Marital Status: Married   Outpatient Encounter Medications as of 08/22/2023  Medication Sig   acetaminophen (TYLENOL) 500 MG tablet Take 500-1,000 mg by mouth every 6 (six) hours as needed for moderate pain (pain score 4-6).   allopurinol (ZYLOPRIM) 100 MG tablet TAKE 1 TABLET(100 MG) BY MOUTH DAILY   apalutamide (ERLEADA) 60 MG tablet TAKE 4 TABLETS (240 MG  TOTAL) BY MOUTH DAILY. MAY BE TAKEN WITH OR WITHOUT FOOD. SWALLOW TABLETS WHOLE. (Patient taking differently: at bedtime.)   Cyanocobalamin (VITAMIN B 12) 500 MCG TABS Take 500 mcg by mouth in the morning.   finasteride (PROSCAR) 5 MG tablet Take 5 mg by mouth daily.   fluticasone (FLONASE) 50 MCG/ACT nasal spray Place 2 sprays into both nostrils daily. (Patient taking differently: Place 2 sprays into both nostrils daily as needed for allergies or rhinitis.)   isosorbide mononitrate (IMDUR) 30 MG 24 hr tablet Take 1 tablet (30 mg total) by mouth daily.   LINZESS 145 MCG CAPS capsule Take 145 mcg by mouth every morning.   loratadine (CLARITIN) 10 MG tablet TAKE 1 TABLET(10 MG) BY MOUTH DAILY AS NEEDED FOR ALLERGIES   naloxone (NARCAN) nasal spray 4 mg/0.1 mL Place 4 mg into the nose once as needed (accidental overdose).   nitroGLYCERIN (NITROSTAT) 0.4 MG SL tablet PLACE 1 TABLET UNDER THE TONGUE EVERY 5 MINUTES AS NEEDED FOR CHEST PAIN. CALL 911 AT THIRD DOSE IN 15 MINUTES   oxyCODONE-acetaminophen (PERCOCET) 10-325 MG tablet Take 1 tablet by mouth every 8 (eight) hours as needed for pain.   pantoprazole (PROTONIX) 40 MG tablet TAKE 1 TABLET(40 MG) BY MOUTH DAILY   phenazopyridine (PYRIDIUM) 100 MG tablet Take 1 tablet (100 mg total) by mouth 3 (three) times daily with meals.   rosuvastatin (CRESTOR) 40 MG tablet Take 40 mg by mouth every evening.   silodosin (RAPAFLO) 8 MG CAPS capsule TAKE 1 CAPSULE BY MOUTH TWICE DAILY   solifenacin (VESICARE) 10 MG tablet TAKE 1 TABLET(10 MG) BY MOUTH DAILY   sotalol (BETAPACE) 80 MG tablet Take 1 tablet (80 mg total) by mouth 2 (two) times daily.   Vibegron (GEMTESA) 75 MG TABS Take 75 mg by mouth every Monday, Wednesday, and Friday.   Vitamin D, Ergocalciferol, (DRISDOL) 1.25 MG (50000 UNIT) CAPS capsule TAKE 1 CAPSULE BY MOUTH EVERY 7 DAYS (Patient taking differently: Take 50,000 Units by mouth every 14 (fourteen) days.)   [DISCONTINUED] FARXIGA 5 MG TABS  tablet Take 1 tablet (5 mg total) by mouth daily.   [DISCONTINUED] levothyroxine (SYNTHROID) 200 MCG tablet Take 1 tablet (200 mcg total) by mouth daily before breakfast.   FARXIGA 5 MG TABS tablet Take 1 tablet (5 mg total) by mouth daily.   levothyroxine (SYNTHROID) 200 MCG tablet Take 1 tablet (200 mcg total) by mouth daily before breakfast.   levothyroxine (SYNTHROID) 25 MCG tablet Take 1 tablet (25 mcg total) by mouth See admin instructions. Takes along with 200 mcg to total 225 mcg daily   ONETOUCH VERIO test strip daily.   [DISCONTINUED] levothyroxine (SYNTHROID) 25 MCG tablet Take 1 tablet (25 mcg total) by mouth See admin instructions. Takes along with 200 mcg to total 225 mcg daily (Patient not taking: Reported on 08/22/2023)   No facility-administered encounter medications on file as of 08/22/2023.   ALLERGIES: Allergies  Allergen Reactions   Lactose Intolerance (Gi) Other (See Comments)    UPSET STOMACH    Trazodone And Nefazodone     Nightmares   VACCINATION STATUS: Immunization History  Administered Date(s) Administered   Fluad Quad(high Dose 65+) 03/24/2021   Influenza,inj,Quad PF,6+ Mos 02/17/2014, 03/09/2015, 03/10/2016, 03/27/2017, 03/20/2018, 03/04/2019, 03/15/2020   Moderna Sars-Covid-2 Vaccination 08/03/2019, 09/06/2019   Pneumococcal Polysaccharide-23 01/05/2014   Tdap 12/19/2010   Zoster Recombinant(Shingrix) 12/17/2019, 02/15/2020    Diabetes He presents for his follow-up diabetic visit. He has type 2 diabetes mellitus. His disease course has been stable. There are no hypoglycemic associated symptoms. Pertinent negatives for hypoglycemia include no headaches, nervousness/anxiousness or tremors. Associated symptoms include fatigue. Pertinent negatives for diabetes include no chest pain, no foot paresthesias, no polydipsia, no polyuria and no weight loss. There are no hypoglycemic complications. Symptoms are stable. Diabetic complications include heart disease,  impotence, nephropathy and peripheral neuropathy. Risk factors for coronary artery disease include hypertension, diabetes mellitus, dyslipidemia, male sex, obesity, tobacco exposure and sedentary lifestyle. Current diabetic treatment includes oral agent (monotherapy). He is compliant with treatment most of the time. His weight is fluctuating minimally. He is following a generally healthy diet. When asked about meal planning, he reported none. He has not had a previous visit with a dietitian. He never participates in exercise. His home blood glucose trend is fluctuating minimally. (He presents today with no meter or logs to review.  He was not asked to monitor glucose routinely due to safe medication regimen.  His POCT A1c today is 6.5%, increasing slightly from last visit of 6%, but still safe goal.  He was taken off his Metformin during a recent hospitalization for unknown reasons, but is doing reasonably well without it.) An ACE inhibitor/angiotensin II receptor blocker is not being taken. He does not see a podiatrist.Eye exam is not current.  Thyroid Problem Presents for follow-up (He was given RAI therapy on March 16, 2014. - he denies family history of thyroid dysfunction .) visit. Symptoms include fatigue. Patient reports no anxiety, cold intolerance, constipation, diarrhea, heat intolerance, leg swelling, palpitations, tremors, weight gain or weight loss. The symptoms have been stable.    Review of systems  Constitutional: + Minimally fluctuating body weight,  current Body mass index is 38.54 kg/m. , no fatigue, no subjective hyperthermia, no subjective hypothermia Eyes: no blurry vision, no xerophthalmia ENT: no sore throat, no nodules palpated in throat, no dysphagia/odynophagia, no hoarseness Cardiovascular: no chest pain, no shortness of breath, no palpitations, no leg swelling Respiratory: no cough, no shortness of breath Gastrointestinal: no nausea/vomiting/diarrhea Musculoskeletal: no  muscle/joint aches Skin: no rashes,  no hyperemia Neurological: no tremors, no numbness, no tingling, no dizziness Psychiatric: no depression, no anxiety    Objective:    BP 98/70 (BP Location: Left Arm, Patient Position: Sitting, Cuff Size: Large)   Pulse 72   Ht 6' (1.829 m)   Wt 284 lb 3.2 oz (128.9 kg)   BMI 38.54 kg/m   Wt Readings from Last 3 Encounters:  08/22/23 284 lb 3.2 oz (128.9 kg)  08/14/23 289 lb 0.4 oz (131.1 kg)  06/21/23 278 lb 4.8 oz (126.2 kg)    BP Readings from Last 3 Encounters:  08/22/23 98/70  08/21/23 110/75  08/14/23 97/76     Physical Exam- Limited  Constitutional:  Body mass index is 38.54 kg/m. , not in acute distress, normal state of mind Eyes:  EOMI, no exophthalmos Musculoskeletal: no gross deformities, strength intact in all four extremities, no gross restriction of joint movements Skin:  no rashes, no hyperemia Neurological: no tremor with outstretched hands   Diabetic Foot Exam - Simple   No data filed     Diabetic Labs (most recent): Lab Results  Component Value Date   HGBA1C 6.5 08/22/2023   HGBA1C 6.0 (H) 12/18/2022   HGBA1C 6.7 10/10/2021   MICROALBUR 150 03/09/2021   MICROALBUR 1.3 03/15/2020   MICROALBUR 4.1 11/05/2015     Lipid Panel ( most recent) Lipid Panel     Component Value Date/Time   CHOL 115 09/12/2021 0458   TRIG 69 09/12/2021 0458   HDL 39 (L) 09/12/2021 0458   CHOLHDL 2.9 09/12/2021 0458   VLDL 14 09/12/2021 0458   LDLCALC 62 09/12/2021 0458   LDLCALC 217 (H) 09/08/2020 0000    Recent Results (from the past 2160 hours)  CBC with Differential     Status: Abnormal   Collection Time: 06/06/23  5:58 PM  Result Value Ref Range   WBC 6.0 4.0 - 10.5 K/uL   RBC 3.95 (L) 4.22 - 5.81 MIL/uL   Hemoglobin 13.0 13.0 - 17.0 g/dL   HCT 21.3 (L) 08.6 - 57.8 %   MCV 96.7 80.0 - 100.0 fL   MCH 32.9 26.0 - 34.0 pg   MCHC 34.0 30.0 - 36.0 g/dL   RDW 46.9 62.9 - 52.8 %   Platelets 116 (L) 150 - 400 K/uL    nRBC 0.0 0.0 - 0.2 %   Neutrophils Relative % 56 %   Neutro Abs 3.3 1.7 - 7.7 K/uL   Lymphocytes Relative 29 %   Lymphs Abs 1.8 0.7 - 4.0 K/uL   Monocytes Relative 11 %   Monocytes Absolute 0.7 0.1 - 1.0 K/uL   Eosinophils Relative 3 %   Eosinophils Absolute 0.2 0.0 - 0.5 K/uL   Basophils Relative 1 %   Basophils Absolute 0.1 0.0 - 0.1 K/uL   Immature Granulocytes 0 %   Abs Immature Granulocytes 0.02 0.00 - 0.07 K/uL    Comment: Performed at [facility], [address], [place], [place] [address]  Basic metabolic panel     Status: Abnormal   Collection Time: 06/06/23  5:58 PM  Result Value Ref Range   Sodium 136 135 - 145 mmol/L   Potassium 3.5 3.5 - 5.1 mmol/L   Chloride 108 98 - 111 mmol/L   CO2 20 (L) 22 - 32 mmol/L   Glucose, Bld 141 (H) 70 - 99 mg/dL    Comment: Glucose reference range applies only to samples taken after fasting for at least 8 hours.   BUN 14 8 -  23 mg/dL   Creatinine, Ser 4.01 0.61 - 1.24 mg/dL   Calcium 9.0 8.9 - 02.7 mg/dL   GFR, Estimated >25 >36 mL/min    Comment: (NOTE) Calculated using the CKD-EPI Creatinine Equation (2021)    Anion gap 8 5 - 15    Comment: Performed at [facility], [address], [place], [place] [address]  Urinalysis, Routine w reflex microscopic -Urine, Catheterized     Status: Abnormal   Collection Time: 06/06/23  5:58 PM  Result Value Ref Range   Color, Urine YELLOW YELLOW   APPearance HAZY (A) CLEAR   Specific Gravity, Urine 1.015 1.005 - 1.030   pH 6.0 5.0 - 8.0   Glucose, UA >=500 (A) NEGATIVE mg/dL   Hgb urine dipstick LARGE (A) NEGATIVE   Bilirubin Urine NEGATIVE NEGATIVE   Ketones, ur NEGATIVE NEGATIVE mg/dL   Protein, ur 474 (A) NEGATIVE mg/dL   Nitrite NEGATIVE NEGATIVE   Leukocytes,Ua NEGATIVE NEGATIVE   RBC / HPF >50 0 - 5 RBC/hpf   WBC, UA 6-10 0 - 5 WBC/hpf   Bacteria, UA FEW (A) NONE SEEN   Squamous Epithelial / HPF 0-5 0 - 5 /HPF    Comment: Performed at [facility], [address], [place],  [place] [address]  Urine Culture     Status: None   Collection Time: 06/06/23  9:16 PM   Specimen: Urine, Catheterized  Result Value Ref Range   Specimen Description      URINE, CATHETERIZED Performed at [facility], [address], [place], [place] [address]    Special Requests      NONE Performed at [facility], [address], [place], [place] [address]    Culture      NO GROWTH Performed at [facility], [address] [place], [place] [address]    Report Status 06/08/2023 FINAL   Urinalysis, Routine w reflex microscopic -Urine, Catheterized     Status: Abnormal   Collection Time: 06/07/23  7:10 PM  Result Value Ref Range   Color, Urine AMBER (A) YELLOW    Comment: BIOCHEMICALS MAY BE AFFECTED BY COLOR   APPearance CLOUDY (A) CLEAR   Specific Gravity, Urine  1.005 - 1.030    TEST NOT REPORTED DUE TO COLOR INTERFERENCE OF URINE PIGMENT   pH  5.0 - 8.0    TEST NOT REPORTED DUE TO COLOR INTERFERENCE OF URINE PIGMENT   Glucose, UA (A) NEGATIVE mg/dL    TEST NOT REPORTED DUE TO COLOR INTERFERENCE OF URINE PIGMENT   Hgb urine dipstick (A) NEGATIVE    TEST NOT REPORTED DUE TO COLOR INTERFERENCE OF URINE PIGMENT   Bilirubin Urine (A) NEGATIVE    TEST NOT REPORTED DUE TO COLOR INTERFERENCE OF URINE PIGMENT   Ketones, ur (A) NEGATIVE mg/dL    TEST NOT REPORTED DUE TO COLOR INTERFERENCE OF URINE PIGMENT   Protein, ur (A) NEGATIVE mg/dL    TEST NOT REPORTED DUE TO COLOR INTERFERENCE OF URINE PIGMENT   Nitrite (A) NEGATIVE    TEST NOT REPORTED DUE TO COLOR INTERFERENCE OF URINE PIGMENT   Leukocytes,Ua (A) NEGATIVE    TEST NOT REPORTED DUE TO COLOR INTERFERENCE OF URINE PIGMENT    Comment: Performed at [facility], [address] [place], [place] [address]  CBC     Status: Abnormal   Collection Time: 06/07/23  7:10 PM  Result Value Ref Range   WBC 9.1 4.0 - 10.5 K/uL   RBC 4.19 (L) 4.22 -  5.81 MIL/uL   Hemoglobin 13.5 13.0 - 17.0 g/dL   HCT 16.1 09.6 - 04.5 %   MCV 97.4  80.0 - 100.0 fL   MCH 32.2 26.0 - 34.0 pg   MCHC 33.1 30.0 - 36.0 g/dL   RDW 40.9 81.1 - 91.4 %   Platelets 137 (L) 150 - 400 K/uL   nRBC 0.0 0.0 - 0.2 %    Comment: Performed at [facility], [address] [place], [place] [address]  Basic metabolic panel     Status: Abnormal   Collection Time: 06/07/23  7:10 PM  Result Value Ref Range   Sodium 139 135 - 145 mmol/L   Potassium 4.2 3.5 - 5.1 mmol/L    Comment: HHML HEMOLYSIS AT THIS LEVEL MAY AFFECT RESULT    Chloride 109 98 - 111 mmol/L   CO2 19 (L) 22 - 32 mmol/L   Glucose, Bld 159 (H) 70 - 99 mg/dL    Comment: Glucose reference range applies only to samples taken after fasting for at least 8 hours.   BUN 15 8 - 23 mg/dL   Creatinine, Ser 6.21 (H) 0.61 - 1.24 mg/dL   Calcium 9.3 8.9 - 30.8 mg/dL   GFR, Estimated 56 (L) >60 mL/min    Comment: (NOTE) Calculated using the CKD-EPI Creatinine Equation (2021)    Anion gap 11 5 - 15    Comment: Performed at [facility], [address] [place], [place] [address]  Urinalysis, Microscopic (reflex)     Status: None   Collection Time: 06/07/23  7:10 PM  Result Value Ref Range   RBC / HPF >50 0 - 5 RBC/hpf   WBC, UA 11-20 0 - 5 WBC/hpf   Bacteria, UA NONE SEEN NONE SEEN   Squamous Epithelial / HPF 0-5 0 - 5 /HPF    Comment: Performed at [facility], [address], [place], [place] [address]  HIV Antibody (routine testing w rflx)     Status: None   Collection Time: 06/08/23  3:49 PM  Result Value Ref Range   HIV Screen 4th Generation wRfx Non Reactive Non Reactive    Comment: Performed at [facility], [address] [place], [place] [address]  Hemoglobin and hematocrit, blood     Status: None   Collection Time: 06/08/23  3:49 PM  Result Value Ref Range   Hemoglobin 13.2 13.0 - 17.0 g/dL   HCT 32.4 40.1 - 02.7 %    Comment: Performed at [facility], [address], [place], [place] [address]  Hemoglobin and hematocrit, blood     Status:  Abnormal   Collection Time: 06/08/23  6:52 PM  Result Value Ref Range   Hemoglobin 12.9 (L) 13.0 - 17.0 g/dL   HCT 44.0 (L) 34.7 - 42.5 %    Comment: Performed at [facility], [address], [place], [place] [address]  CBC     Status: Abnormal   Collection Time: 06/09/23  5:43 AM  Result Value Ref Range   WBC 9.1 4.0 - 10.5 K/uL   RBC 3.67 (L) 4.22 - 5.81 MIL/uL   Hemoglobin 11.8 (L) 13.0 - 17.0 g/dL   HCT 75.6 (L) 43.3 - 29.5 %   MCV 95.1 80.0 - 100.0 fL   MCH 32.2 26.0 - 34.0 pg   MCHC 33.8 30.0 - 36.0 g/dL   RDW 18.8 41.6 - 60.6 %   Platelets 88 (L) 150 - 400  K/uL    Comment: Immature Platelet Fraction may be clinically indicated, consider ordering this additional test [id number] REPEATED TO VERIFY    nRBC 0.0 0.0 - 0.2 %    Comment: Performed at [facility], [address] [place], [place] [address]  Basic metabolic panel     Status: Abnormal   Collection Time: 06/09/23  5:43 AM  Result Value Ref Range   Sodium 137 135 - 145 mmol/L   Potassium 3.1 (L) 3.5 - 5.1 mmol/L   Chloride 108 98 - 111 mmol/L   CO2 20 (L) 22 - 32 mmol/L   Glucose, Bld 143 (H) 70 - 99 mg/dL    Comment: Glucose reference range applies only to samples taken after fasting for at least 8 hours.   BUN 13 8 - 23 mg/dL   Creatinine, Ser 2.95 0.61 - 1.24 mg/dL   Calcium 8.7 (L) 8.9 - 10.3 mg/dL   GFR, Estimated >28 >41 mL/min    Comment: (NOTE) Calculated using the CKD-EPI Creatinine Equation (2021)    Anion gap 9 5 - 15    Comment: Performed at [facility], [address], [place], [place] [address]  CBC     Status: Abnormal   Collection Time: 06/10/23 12:39 PM  Result Value Ref Range   WBC 11.0 (H) 4.0 - 10.5 K/uL   RBC 3.89 (L) 4.22 - 5.81 MIL/uL   Hemoglobin 12.6 (L) 13.0 - 17.0 g/dL   HCT 10.2 (L) 72.5 - 36.6 %   MCV 95.6 80.0 - 100.0 fL   MCH 32.4 26.0 - 34.0 pg   MCHC 33.9 30.0 - 36.0 g/dL   RDW 44.0 34.7 - 42.5 %   Platelets 115 (L) 150 - 400 K/uL   nRBC 0.0 0.0 -  0.2 %    Comment: Performed at [facility], [address], [place], [place] [address]  Basic metabolic panel     Status: Abnormal   Collection Time: 06/10/23 12:39 PM  Result Value Ref Range   Sodium 138 135 - 145 mmol/L   Potassium 3.6 3.5 - 5.1 mmol/L   Chloride 109 98 - 111 mmol/L   CO2 17 (L) 22 - 32 mmol/L   Glucose, Bld 203 (H) 70 - 99 mg/dL    Comment: Glucose reference range applies only to samples taken after fasting for at least 8 hours.   BUN 14 8 - 23 mg/dL   Creatinine, Ser 7.56 (H) 0.61 - 1.24 mg/dL   Calcium 9.1 8.9 - 43.3 mg/dL   GFR, Estimated >29 >51 mL/min    Comment: (NOTE) Calculated using the CKD-EPI Creatinine Equation (2021)    Anion gap 12 5 - 15    Comment: Performed at [facility], [address] [place], [place] [address]  I-STAT, Alwyn Pea 8     Status: Abnormal   Collection Time: 06/21/23 10:44 AM  Result Value Ref Range   Sodium 139 135 - 145 mmol/L   Potassium 4.2 3.5 - 5.1 mmol/L   Chloride 109 98 - 111 mmol/L   BUN 9 8 - 23 mg/dL   Creatinine, Ser 6.06 0.61 - 1.24 mg/dL   Glucose, Bld 301 (H) 70 - 99 mg/dL    Comment: Glucose reference range applies only to samples taken after fasting for at least 8 hours.   Calcium, Ion 1.06 (L) 1.15 - 1.40 mmol/L   TCO2 21 (L) 22 - 32 mmol/L   Hemoglobin 11.6 (L) 13.0 -  17.0 g/dL   HCT 86.5 (L) 78.4 - 69.6 %  Surgical pathology     Status: None   Collection Time: 06/21/23 12:28 PM  Result Value Ref Range   SURGICAL PATHOLOGY      SURGICAL PATHOLOGY CASE: [id number] PATIENT: Alessandra Grout Surgical Pathology Report     Clinical History: Gross hematuria (crm)     FINAL MICROSCOPIC DIAGNOSIS:  A. 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  The specimen is submitted in toto.  [facility] 06/21/2023)   Final Diagnosis performed by Marlena Clipper, MD.   Electronically signed 06/22/2023 Technical and / or Professional components performed at [facility], [address], [place], [place] [address].  Immunohistochemistry Technical component (if applicable) was performed at [facility]. [address], [place], [place] [address].   IMMUNOHISTOCHEMISTRY DISCLAIMER (if applicable): Some of these immunohistochemical stains may have been developed and the performance characteristics determine by [facility]. Some may not have been cleared or approved by the U.S. Food and Drug Administration. The FDA has determined that such clearance or approval is not necessary. This test is used for clinical purposes. It should not be regarded as investigational or for research. This laboratory is certified under the Clinical Laboratory Improvement Amendments of 1988 (CLIA-88) as qualified to perform high complexity clinical laboratory testing.  The controls stained appropriately.   IHC stains are performed on formalin fixed, paraffin embedded tissue using a 3,3"diaminobenzidine (DAB) chromogen and Leica Bond Autostainer System. The staining intensity of the nucleus is score manually and is reported as the percentage of tumor cell nuclei demonstrating specific nuclear staining. The specimens are fixed in 10% Neutral Formalin for at least 6 hours and up to 72hrs. These tests are validated on decalcified tissue. Results should be interpreted with caution given the possibility of fal se negative results on decalcified specimens. Antibody Clones are as follows ER-clone 68F, PR-clone 16, Ki67- clone MM1. Some of these immunohistochemical stains may have been developed and the performance characteristics determined by [facility].   Glucose, capillary     Status: Abnormal   Collection Time: 06/21/23  1:00 PM  Result Value Ref Range   Glucose-Capillary 128 (H) 70 - 99 mg/dL     Comment: Glucose reference range applies only to samples taken after fasting for at least 8 hours.  BLADDER SCAN AMB NON-IMAGING     Status: None   Collection Time: 07/24/23 10:45 AM  Result Value Ref Range   Scan Result 85   CUP PACEART REMOTE DEVICE CHECK     Status: None   Collection Time: 08/08/23  2:00 AM  Result Value Ref Range   Date Time Interrogation Session [id number]    Pulse Generator Manufacturer SJ[employer]    Pulse Gen Model 2240 Assurity    Pulse Gen Serial Number [id number]    Clinic Name [facility]    Implantable Pulse Generator Type Implantable Pulse Generator    Implantable Pulse Generator Implant Date [id number]    Implantable Lead Manufacturer [facility]    Implantable Lead Model 1688TC Tendril SDX    Implantable Lead Serial Number [id number]    Implantable Lead Implant Date [id number]    Implantable Lead Location Detail 1 UNKNOWN    Implantable Lead Location [id number]    Implantable Lead Connection Status [id number]    Implantable Lead Manufacturer SJCR  Implantable Lead Model 1888TC Tendril ST Optim    Implantable Lead Serial Number [id number]    Implantable Lead Implant Date [id number]    Implantable Lead Location Detail 1 UNKNOWN    Implantable Lead Location [phone]    Implantable Lead Connection Status [phone]    Lead Channel Setting Sensing Sensitivity 2.0 mV   Lead Channel Setting Sensing Adaptation Mode Fixed Pacing    Lead Channel Setting Pacing Amplitude 2.0 V   Lead Channel Setting Pacing Pulse Width 0.5 ms   Lead Channel Setting Pacing Amplitude 2.5 V   Lead Channel Status NULL    Lead Channel Impedance Value 400 ohm   Lead Channel Sensing Intrinsic Amplitude 2.4 mV   Lead Channel Pacing Threshold Amplitude 0.75 V   Lead Channel Pacing Threshold Pulse Width 0.5 ms   Lead Channel Status NULL    Lead Channel Impedance Value 580 ohm   Lead Channel Sensing Intrinsic Amplitude 12.0 mV   Lead Channel Pacing Threshold Amplitude 0.75 V   Lead Channel Pacing Threshold  Pulse Width 0.5 ms   Battery Status MOS    Battery Remaining Longevity 29 mo   Battery Remaining Percentage 24.0 %   Battery Voltage 2.92 V   Brady Statistic RA Percent Paced 32.0 %   Brady Statistic RV Percent Paced 6.3 %   Brady Statistic AP VP Percent 5.3 %   Brady Statistic AS VP Percent 1.0 %   Brady Statistic AP VS Percent 29.0 %   Brady Statistic AS VS Percent 64.0 %  PSA     Status: None   Collection Time: 08/08/23 12:44 PM  Result Value Ref Range   Prostatic Specific Antigen <0.01 0.00 - 4.00 ng/mL    Comment: (NOTE) While PSA levels of <=4.00 ng/ml are reported as reference range, some men with levels below 4.00 ng/ml can have prostate cancer and many men with PSA above 4.00 ng/ml do not have prostate cancer.  Other tests such as free PSA, age specific reference ranges, PSA velocity and PSA doubling time may be helpful especially in men less than 38 years old. Performed at [facility], [address] [place], [place] [address]   Iron and TIBC [facility] DWB/AP/ASH/[place] ONLY)     Status: Abnormal   Collection Time: 08/08/23 12:44 PM  Result Value Ref Range   Iron 39 (L) 45 - 182 ug/dL   TIBC 132 (H) 440 - 102 ug/dL   Saturation Ratios 9 (L) 17.9 - 39.5 %   UIBC 421 ug/dL    Comment: Performed at [facility], [address], [place], [place] [address]  Ferritin     Status: Abnormal   Collection Time: 08/08/23 12:44 PM  Result Value Ref Range   Ferritin 12 (L) 24 - 336 ng/mL    Comment: Performed at [facility], [address], [place], [place] [address]  Comprehensive metabolic panel     Status: Abnormal   Collection Time: 08/08/23 12:45 PM  Result Value Ref Range   Sodium 138 135 - 145 mmol/L   Potassium 4.1 3.5 - 5.1 mmol/L   Chloride 108 98 - 111 mmol/L   CO2 21 (L) 22 - 32 mmol/L   Glucose, Bld 121 (H) 70 - 99 mg/dL    Comment: Glucose reference range applies only to samples taken after fasting for at least 8 hours.   BUN 11 8 - 23  mg/dL   Creatinine, Ser 4.74 0.61 - 1.24 mg/dL   Calcium 8.9 8.9 -  10.3 mg/dL   Total Protein 7.4 6.5 - 8.1 g/dL   Albumin 3.5 3.5 - 5.0 g/dL   AST 19 15 - 41 U/L   ALT 8 0 - 44 U/L   Alkaline Phosphatase 82 38 - 126 U/L   Total Bilirubin 0.6 0.0 - 1.2 mg/dL   GFR, Estimated >81 >19 mL/min    Comment: (NOTE) Calculated using the CKD-EPI Creatinine Equation (2021)    Anion gap 9 5 - 15    Comment: Performed at [facility], [address] [place], [place] [address]  CBC with Differential     Status: Abnormal   Collection Time: 08/08/23 12:45 PM  Result Value Ref Range   WBC 6.2 4.0 - 10.5 K/uL   RBC 3.78 (L) 4.22 - 5.81 MIL/uL   Hemoglobin 10.4 (L) 13.0 - 17.0 g/dL   HCT 95.6 (L) 21.3 - 08.6 %   MCV 91.0 80.0 - 100.0 fL   MCH 27.5 26.0 - 34.0 pg   MCHC 30.2 30.0 - 36.0 g/dL   RDW 57.8 (H) 46.9 - 62.9 %   Platelets 150 150 - 400 K/uL   nRBC 0.0 0.0 - 0.2 %   Neutrophils Relative % 62 %   Neutro Abs 3.8 1.7 - 7.7 K/uL   Lymphocytes Relative 24 %   Lymphs Abs 1.5 0.7 - 4.0 K/uL   Monocytes Relative 11 %   Monocytes Absolute 0.7 0.1 - 1.0 K/uL   Eosinophils Relative 2 %   Eosinophils Absolute 0.1 0.0 - 0.5 K/uL   Basophils Relative 1 %   Basophils Absolute 0.1 0.0 - 0.1 K/uL   Immature Granulocytes 0 %   Abs Immature Granulocytes 0.02 0.00 - 0.07 K/uL    Comment: Performed at [facility], [address] [place], [place] [address]  Comprehensive metabolic panel     Status: Abnormal   Collection Time: 08/15/23 10:05 AM  Result Value Ref Range   Glucose 160 (H) 70 - 99 mg/dL   BUN 13 8 - 27 mg/dL   Creatinine, Ser 3.24 0.76 - 1.27 mg/dL   eGFR 70 >40 NU/UVO/5.36   BUN/Creatinine Ratio 11 10 - 24   Sodium 140 134 - 144 mmol/L   Potassium 4.2 3.5 - 5.2 mmol/L   Chloride 107 (H) 96 - 106 mmol/L   CO2 18 (L) 20 - 29 mmol/L   Calcium 9.3 8.6 - 10.2 mg/dL   Total Protein 7.2 6.0 - 8.5 g/dL   Albumin 4.1 3.9 - 4.9 g/dL   Globulin, Total 3.1 1.5 - 4.5 g/dL   Bilirubin Total  0.5 0.0 - 1.2 mg/dL   Alkaline Phosphatase 109 44 - 121 IU/L   AST 15 0 - 40 IU/L   ALT 6 0 - 44 IU/L  TSH     Status: Abnormal   Collection Time: 08/15/23 10:05 AM  Result Value Ref Range   TSH 6.030 (H) 0.450 - 4.500 uIU/mL  T4, free     Status: None   Collection Time: 08/15/23 10:05 AM  Result Value Ref Range   Free T4 1.56 0.82 - 1.77 ng/dL  HgB U4Q     Status: None   Collection Time: 08/22/23  8:17 AM  Result Value Ref Range   Hemoglobin A1C     HbA1c POC (<> result, manual entry)     HbA1c, POC (prediabetic range)     HbA1c, POC (controlled diabetic range) 6.5 0.0 - 7.0 %     Assessment & Plan:  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  He is advised to continue his current dose of Levothyroxine 225 mcg po daily before breakfast (close to max weight based calculation of total dose).    - We discussed about the correct intake of his thyroid hormone, on empty stomach at fasting, with water, separated by at least 30 minutes from breakfast and other medications,  and separated by more than 4 hours from calcium, iron, multivitamins, acid reflux medications (PPIs). -Patient is made aware of the fact that thyroid hormone replacement is needed for life, dose to be adjusted by periodic monitoring of thyroid function tests.  2. Diabetes mellitus type 2 in obese [facility])  He presents today with no meter or logs to review.  He was not asked to monitor glucose routinely due to safe medication regimen.  His POCT A1c today is 6.5%, increasing slightly from last visit of 6%, but still safe goal.  He was taken off his Metformin during a recent hospitalization for unknown reasons, but is doing reasonably well without it.  - Nutritional counseling repeated at each appointment due to patients tendency to fall back in to old habits.  - The patient admits there is a room for improvement in their diet and  drink choices. -  Suggestion is made for the patient to avoid simple carbohydrates from their diet including Cakes, Sweet Desserts / Pastries, Ice Cream, Soda (diet and regular), Sweet Tea, Candies, Chips, Cookies, Sweet Pastries, Store Bought Juices, Alcohol in Excess of 1-2 drinks a day, Artificial Sweeteners, Coffee Creamer, and "Sugar-free" Products. This will help patient to have stable blood glucose profile and potentially avoid unintended weight gain.   - I encouraged the patient to switch to unprocessed or minimally processed complex starch and increased protein intake (animal or plant source), fruits, and vegetables.   - Patient is advised to stick to a routine mealtimes to eat 3 meals a day and avoid unnecessary snacks (to snack only to correct hypoglycemia).  - He is advised to continue Farxiga 5 mg po daily.  His Metformin was recently discontinued after recent hospitalization (no reason was documented). He has done reasonably well without it, thus will keep him off for now.  -He does not need to routinely monitor glucose due to safe medication regimen.  -He will be considered for incretin therapy on subsequent visits.  3. Essential hypertension His blood pressure is controlled to target.  He is advised to continue Lasix 60 mg po daily.  4.  Hyperlipidemia:  His most recent lipid panel from 09/12/21 shows controlled LDL at 62.  He is advised to continue Simvastatin 40 mg po daily at bedtime.  Side effects and precautions discussed with him.    5. Vitamin D Deficiency: His most recent vitamin D level on 06/20/21 shows improvement to 79.22.  He has completed replenishment with ergocalciferol.  No need for additional supplementation at this time.   - I advised patient to maintain close follow up with Fleet Contras, MD for primary care needs.     I spent  25  minutes in the care of the patient today including review of labs from CMP, Lipids, Thyroid Function, Hematology (current  and previous including abstractions from other facilities); face-to-face time discussing  his blood glucose readings/logs, discussing hypoglycemia and hyperglycemia episodes and symptoms, medications doses, his options of short and long term treatment based on the latest standards of  care / guidelines;  discussion about incorporating lifestyle medicine;  and documenting the encounter. Risk reduction counseling performed per USPSTF guidelines to reduce obesity and cardiovascular risk factors.     Please refer to Patient Instructions for Blood Glucose Monitoring and Insulin/Medications Dosing Guide"  in media tab for additional information. Please  also refer to " Patient Self Inventory" in the Media  tab for reviewed elements of pertinent patient history.  Lucious Groves participated in the discussions, expressed understanding, and voiced agreement with the above plans.  All questions were answered to his satisfaction. he is encouraged to contact clinic should he have any questions or concerns prior to his return visit.    Follow up plan: Return in about 4 months (around 12/22/2023) for Diabetes F/U with A1c in office, No previsit labs.  Ronny Bacon, [facility] [address] [place], [place] [address] Phone: [phone] Fax: [phone]   08/22/2023, 8:20 AM

## 2023-08-24 ENCOUNTER — Inpatient Hospital Stay

## 2023-08-24 VITALS — BP 117/76 | HR 67 | Temp 98.2°F | Resp 18

## 2023-08-24 DIAGNOSIS — C61 Malignant neoplasm of prostate: Secondary | ICD-10-CM | POA: Diagnosis not present

## 2023-08-24 DIAGNOSIS — Z191 Hormone sensitive malignancy status: Secondary | ICD-10-CM

## 2023-08-24 MED ORDER — ACETAMINOPHEN 325 MG PO TABS
650.0000 mg | ORAL_TABLET | Freq: Once | ORAL | Status: AC
  Administered 2023-08-24: 650 mg via ORAL
  Filled 2023-08-24: qty 2

## 2023-08-24 MED ORDER — FAMOTIDINE IN NACL 20-0.9 MG/50ML-% IV SOLN
20.0000 mg | Freq: Once | INTRAVENOUS | Status: AC
  Administered 2023-08-24: 20 mg via INTRAVENOUS
  Filled 2023-08-24: qty 50

## 2023-08-24 MED ORDER — IRON DEXTRAN 50 MG/ML IJ SOLN
50.0000 mg | Freq: Once | INTRAMUSCULAR | Status: AC
  Administered 2023-08-24: 50 mg via INTRAVENOUS
  Filled 2023-08-24: qty 1

## 2023-08-24 MED ORDER — METHYLPREDNISOLONE SODIUM SUCC 125 MG IJ SOLR
125.0000 mg | Freq: Once | INTRAMUSCULAR | Status: AC
  Administered 2023-08-24: 125 mg via INTRAVENOUS
  Filled 2023-08-24: qty 2

## 2023-08-24 MED ORDER — CETIRIZINE HCL 10 MG/ML IV SOLN
10.0000 mg | Freq: Once | INTRAVENOUS | Status: AC
  Administered 2023-08-24: 10 mg via INTRAVENOUS
  Filled 2023-08-24: qty 1

## 2023-08-24 MED ORDER — SODIUM CHLORIDE 0.9 % IV SOLN: INTRAVENOUS | Status: DC

## 2023-08-24 MED ORDER — SODIUM CHLORIDE 0.9 % IV SOLN
950.0000 mg | Freq: Once | INTRAVENOUS | Status: AC
  Administered 2023-08-24: 950 mg via INTRAVENOUS
  Filled 2023-08-24: qty 19

## 2023-08-24 NOTE — Progress Notes (Signed)
Patient presents today for iron infusion. Patient is in satisfactory condition with no new complaints voiced.  Vital signs are stable.  We will proceed with infusion per provider orders.    Peripheral IV started with good blood return pre and post infusion.  Infed 1,000 mg given today per MD orders. Tolerated infusion without adverse affects. Vital signs stable. No complaints at this time. Discharged from clinic ambulatory in stable condition. Alert and oriented x 3. F/U with [facility] as scheduled.

## 2023-08-24 NOTE — Patient Instructions (Signed)
 [facility] [place] - A DEPT OF [facility]  Discharge Instructions: Thank you for choosing [facility] to provide your oncology and hematology care.  If you have a lab appointment with the Cancer Center - please note that after April 8th, 2024, all labs will be drawn in the cancer center.  You do not have to check in or register with the main entrance as you have in the past but will complete your check-in in the cancer center.  Wear comfortable clothing and clothing appropriate for easy access to any Portacath or PICC line.   We strive to give you quality time with your provider. You may need to reschedule your appointment if you arrive late (15 or more minutes).  Arriving late affects you and other patients whose appointments are after yours.  Also, if you miss three or more appointments without notifying the office, you may be dismissed from the clinic at the provider's discretion.      For prescription refill requests, have your pharmacy contact our office and allow 72 hours for refills to be completed.    Today you received Infed IV iron infusion.    BELOW ARE SYMPTOMS THAT SHOULD BE REPORTED IMMEDIATELY: *FEVER GREATER THAN 100.4 F (38 C) OR HIGHER *CHILLS OR SWEATING *NAUSEA AND VOMITING THAT IS NOT CONTROLLED WITH YOUR NAUSEA MEDICATION *UNUSUAL SHORTNESS OF BREATH *UNUSUAL BRUISING OR BLEEDING *URINARY PROBLEMS (pain or burning when urinating, or frequent urination) *BOWEL PROBLEMS (unusual diarrhea, constipation, pain near the anus) TENDERNESS IN MOUTH AND THROAT WITH OR WITHOUT PRESENCE OF ULCERS (sore throat, sores in mouth, or a toothache) UNUSUAL RASH, SWELLING OR PAIN  UNUSUAL VAGINAL DISCHARGE OR ITCHING   Items with * indicate a potential emergency and should be followed up as soon as possible or go to the Emergency Department if any problems should occur.  Please show the CHEMOTHERAPY ALERT CARD or IMMUNOTHERAPY ALERT CARD at  check-in to the Emergency Department and triage nurse.  Should you have questions after your visit or need to cancel or reschedule your appointment, please contact [facility] [place] - A DEPT OF [name] [phone]  and follow the prompts.  Office hours are 8:00 a.m. to 4:30 p.m. Monday - Friday. Please note that voicemails left after 4:00 p.m. may not be returned until the following business day.  We are closed weekends and major holidays. You have access to a nurse at all times for urgent questions. Please call the main number to the clinic [phone] and follow the prompts.  For any non-urgent questions, you may also contact your provider using MyChart. We now offer e-Visits for anyone 42 and older to request care online for non-urgent symptoms. For details visit [email or web address].   Also download the MyChart app! Go to the app store, search "MyChart", open the app, select [facility], and log in with your MyChart username and password.

## 2023-08-30 ENCOUNTER — Other Ambulatory Visit: Payer: Self-pay | Admitting: Physician Assistant

## 2023-09-19 ENCOUNTER — Other Ambulatory Visit: Payer: Self-pay | Admitting: Physician Assistant

## 2023-09-20 ENCOUNTER — Other Ambulatory Visit: Payer: Self-pay | Admitting: Urology

## 2023-09-24 ENCOUNTER — Other Ambulatory Visit: Payer: Self-pay

## 2023-09-24 NOTE — Progress Notes (Signed)
 Specialty Pharmacy Refill Coordination Note  Peter Dunlap is a 68 y.o. male contacted today regarding refills of specialty medication(s) Apalutamide  (ERLEADA )   Patient requested Delivery   Delivery date: 10/01/23   Verified address: [address]   [place] [address]   Medication will be filled on 09/28/23.

## 2023-09-25 ENCOUNTER — Ambulatory Visit: Admitting: Urology

## 2023-09-25 NOTE — Addendum Note (Signed)
 Addended by: Lott Rouleau A on: 09/25/2023 02:20 PM   Modules accepted: Orders

## 2023-09-25 NOTE — Progress Notes (Signed)
 Remote pacemaker transmission.

## 2023-09-28 ENCOUNTER — Other Ambulatory Visit: Payer: Self-pay

## 2023-09-28 ENCOUNTER — Other Ambulatory Visit (HOSPITAL_COMMUNITY): Payer: Self-pay

## 2023-10-07 ENCOUNTER — Other Ambulatory Visit: Payer: Self-pay | Admitting: Internal Medicine

## 2023-10-16 ENCOUNTER — Ambulatory Visit: Payer: 59 | Admitting: Urology

## 2023-10-23 ENCOUNTER — Other Ambulatory Visit: Payer: Self-pay | Admitting: *Deleted

## 2023-10-23 ENCOUNTER — Other Ambulatory Visit: Payer: Self-pay

## 2023-10-23 ENCOUNTER — Other Ambulatory Visit (HOSPITAL_COMMUNITY): Payer: Self-pay

## 2023-10-23 ENCOUNTER — Other Ambulatory Visit: Payer: Self-pay | Admitting: Hematology

## 2023-10-23 MED ORDER — APALUTAMIDE 60 MG PO TABS
ORAL_TABLET | ORAL | 3 refills | Status: DC
  Filled 2023-10-23: qty 120, 30d supply, fill #0
  Filled 2023-11-23: qty 120, 30d supply, fill #1
  Filled 2023-12-20: qty 120, 30d supply, fill #2
  Filled 2024-01-17: qty 120, 30d supply, fill #3

## 2023-10-23 NOTE — Progress Notes (Signed)
 Specialty Pharmacy Ongoing Clinical Assessment Note  Peter Dunlap is a 68 y.o. male who is being followed by the specialty pharmacy service for RxSp Oncology   Patient's specialty medication(s) reviewed today: Apalutamide  (ERLEADA )   Missed doses in the last 4 weeks: 0   Patient/Caregiver did not have any additional questions or concerns.   Therapeutic benefit summary: Patient is achieving benefit   Adverse events/side effects summary: No adverse events/side effects   Patient's therapy is appropriate to: Continue    Goals Addressed             This Visit's Progress    Slow Disease Progression       Patient is on track. Patient will maintain adherence.  PSA remains <0.01 ng/mL as of 08/23/23 labs.          Follow up: 6 months  Aleesa Sweigert M Ulrick Methot Specialty Pharmacist

## 2023-10-23 NOTE — Telephone Encounter (Signed)
 Erleada  prescription approved.  Patient is tolerating and it to continue therapy at this time.

## 2023-10-23 NOTE — Progress Notes (Signed)
 Specialty Pharmacy Refill Coordination Note  Peter Dunlap is a 68 y.o. male contacted today regarding refills of specialty medication(s) Apalutamide  (ERLEADA )   Patient requested Delivery   Delivery date: 10/31/23   Verified address: [address]   [place] [address]   Medication will be filled on 10/30/23.

## 2023-11-07 ENCOUNTER — Ambulatory Visit (INDEPENDENT_AMBULATORY_CARE_PROVIDER_SITE_OTHER): Payer: Medicare Other

## 2023-11-07 DIAGNOSIS — I495 Sick sinus syndrome: Secondary | ICD-10-CM | POA: Diagnosis not present

## 2023-11-07 LAB — CUP PACEART REMOTE DEVICE CHECK
Battery Remaining Longevity: 27 mo
Battery Remaining Percentage: 22 %
Battery Voltage: 2.9 V
Brady Statistic AP VP Percent: 5 %
Brady Statistic AP VS Percent: 31 %
Brady Statistic AS VP Percent: 1.2 %
Brady Statistic AS VS Percent: 62 %
Brady Statistic RA Percent Paced: 33 %
Brady Statistic RV Percent Paced: 6.2 %
Date Time Interrogation Session: 20250604020013
Implantable Lead Connection Status: 753985
Implantable Lead Connection Status: 753985
Implantable Lead Implant Date: 20160415
Implantable Lead Implant Date: 20160415
Implantable Lead Location: 753859
Implantable Lead Location: 753860
Implantable Pulse Generator Implant Date: 20160415
Lead Channel Impedance Value: 410 Ohm
Lead Channel Impedance Value: 600 Ohm
Lead Channel Pacing Threshold Amplitude: 0.75 V
Lead Channel Pacing Threshold Amplitude: 0.75 V
Lead Channel Pacing Threshold Pulse Width: 0.5 ms
Lead Channel Pacing Threshold Pulse Width: 0.5 ms
Lead Channel Sensing Intrinsic Amplitude: 12 mV
Lead Channel Sensing Intrinsic Amplitude: 3.2 mV
Lead Channel Setting Pacing Amplitude: 2 V
Lead Channel Setting Pacing Amplitude: 2.5 V
Lead Channel Setting Pacing Pulse Width: 0.5 ms
Lead Channel Setting Sensing Sensitivity: 2 mV
Pulse Gen Model: 2240
Pulse Gen Serial Number: 7756161

## 2023-11-08 ENCOUNTER — Ambulatory Visit: Payer: Self-pay | Admitting: Internal Medicine

## 2023-11-14 ENCOUNTER — Inpatient Hospital Stay: Attending: Hematology

## 2023-11-14 DIAGNOSIS — I252 Old myocardial infarction: Secondary | ICD-10-CM | POA: Insufficient documentation

## 2023-11-14 DIAGNOSIS — K7460 Unspecified cirrhosis of liver: Secondary | ICD-10-CM | POA: Insufficient documentation

## 2023-11-14 DIAGNOSIS — E119 Type 2 diabetes mellitus without complications: Secondary | ICD-10-CM | POA: Insufficient documentation

## 2023-11-14 DIAGNOSIS — D649 Anemia, unspecified: Secondary | ICD-10-CM | POA: Insufficient documentation

## 2023-11-14 DIAGNOSIS — Z5111 Encounter for antineoplastic chemotherapy: Secondary | ICD-10-CM | POA: Diagnosis present

## 2023-11-14 DIAGNOSIS — E039 Hypothyroidism, unspecified: Secondary | ICD-10-CM | POA: Diagnosis not present

## 2023-11-14 DIAGNOSIS — K760 Fatty (change of) liver, not elsewhere classified: Secondary | ICD-10-CM | POA: Diagnosis not present

## 2023-11-14 DIAGNOSIS — M8580 Other specified disorders of bone density and structure, unspecified site: Secondary | ICD-10-CM | POA: Insufficient documentation

## 2023-11-14 DIAGNOSIS — Z79620 Long term (current) use of immunosuppressive biologic: Secondary | ICD-10-CM | POA: Insufficient documentation

## 2023-11-14 DIAGNOSIS — Z808 Family history of malignant neoplasm of other organs or systems: Secondary | ICD-10-CM | POA: Insufficient documentation

## 2023-11-14 DIAGNOSIS — Z8249 Family history of ischemic heart disease and other diseases of the circulatory system: Secondary | ICD-10-CM | POA: Diagnosis not present

## 2023-11-14 DIAGNOSIS — C61 Malignant neoplasm of prostate: Secondary | ICD-10-CM | POA: Diagnosis present

## 2023-11-14 DIAGNOSIS — Z801 Family history of malignant neoplasm of trachea, bronchus and lung: Secondary | ICD-10-CM | POA: Insufficient documentation

## 2023-11-14 DIAGNOSIS — Z806 Family history of leukemia: Secondary | ICD-10-CM | POA: Insufficient documentation

## 2023-11-14 DIAGNOSIS — Z87891 Personal history of nicotine dependence: Secondary | ICD-10-CM | POA: Insufficient documentation

## 2023-11-14 DIAGNOSIS — Z8041 Family history of malignant neoplasm of ovary: Secondary | ICD-10-CM | POA: Insufficient documentation

## 2023-11-14 DIAGNOSIS — Z833 Family history of diabetes mellitus: Secondary | ICD-10-CM | POA: Insufficient documentation

## 2023-11-14 DIAGNOSIS — E785 Hyperlipidemia, unspecified: Secondary | ICD-10-CM | POA: Insufficient documentation

## 2023-11-14 DIAGNOSIS — N400 Enlarged prostate without lower urinary tract symptoms: Secondary | ICD-10-CM | POA: Insufficient documentation

## 2023-11-14 DIAGNOSIS — Z79899 Other long term (current) drug therapy: Secondary | ICD-10-CM | POA: Diagnosis not present

## 2023-11-14 DIAGNOSIS — Z803 Family history of malignant neoplasm of breast: Secondary | ICD-10-CM | POA: Insufficient documentation

## 2023-11-14 DIAGNOSIS — D696 Thrombocytopenia, unspecified: Secondary | ICD-10-CM | POA: Diagnosis not present

## 2023-11-14 DIAGNOSIS — Z191 Hormone sensitive malignancy status: Secondary | ICD-10-CM

## 2023-11-14 DIAGNOSIS — I10 Essential (primary) hypertension: Secondary | ICD-10-CM | POA: Insufficient documentation

## 2023-11-14 LAB — CBC WITH DIFFERENTIAL/PLATELET
Abs Immature Granulocytes: 0.02 10*3/uL (ref 0.00–0.07)
Basophils Absolute: 0.1 10*3/uL (ref 0.0–0.1)
Basophils Relative: 1 %
Eosinophils Absolute: 0.1 10*3/uL (ref 0.0–0.5)
Eosinophils Relative: 2 %
HCT: 38.3 % — ABNORMAL LOW (ref 39.0–52.0)
Hemoglobin: 12.3 g/dL — ABNORMAL LOW (ref 13.0–17.0)
Immature Granulocytes: 0 %
Lymphocytes Relative: 25 %
Lymphs Abs: 1.6 10*3/uL (ref 0.7–4.0)
MCH: 30.8 pg (ref 26.0–34.0)
MCHC: 32.1 g/dL (ref 30.0–36.0)
MCV: 95.8 fL (ref 80.0–100.0)
Monocytes Absolute: 0.8 10*3/uL (ref 0.1–1.0)
Monocytes Relative: 12 %
Neutro Abs: 3.8 10*3/uL (ref 1.7–7.7)
Neutrophils Relative %: 60 %
Platelets: 121 10*3/uL — ABNORMAL LOW (ref 150–400)
RBC: 4 MIL/uL — ABNORMAL LOW (ref 4.22–5.81)
RDW: 17.7 % — ABNORMAL HIGH (ref 11.5–15.5)
WBC: 6.4 10*3/uL (ref 4.0–10.5)
nRBC: 0 % (ref 0.0–0.2)

## 2023-11-14 LAB — FERRITIN: Ferritin: 32 ng/mL (ref 24–336)

## 2023-11-14 LAB — COMPREHENSIVE METABOLIC PANEL WITH GFR
ALT: 10 U/L (ref 0–44)
AST: 22 U/L (ref 15–41)
Albumin: 3.7 g/dL (ref 3.5–5.0)
Alkaline Phosphatase: 92 U/L (ref 38–126)
Anion gap: 10 (ref 5–15)
BUN: 12 mg/dL (ref 8–23)
CO2: 20 mmol/L — ABNORMAL LOW (ref 22–32)
Calcium: 9.7 mg/dL (ref 8.9–10.3)
Chloride: 109 mmol/L (ref 98–111)
Creatinine, Ser: 1.22 mg/dL (ref 0.61–1.24)
GFR, Estimated: >60 mL/min (ref >60)
Glucose, Bld: 131 mg/dL — ABNORMAL HIGH (ref 70–99)
Potassium: 4.1 mmol/L (ref 3.5–5.1)
Sodium: 139 mmol/L (ref 135–145)
Total Bilirubin: 0.6 mg/dL (ref 0.0–1.2)
Total Protein: 7.6 g/dL (ref 6.5–8.1)

## 2023-11-14 LAB — PSA: Prostatic Specific Antigen: <0.01 ng/mL (ref 0.00–4.00)

## 2023-11-14 LAB — IRON AND TIBC
Iron: 50 ug/dL (ref 45–182)
Saturation Ratios: 12 % — ABNORMAL LOW (ref 17.9–39.5)
TIBC: 429 ug/dL (ref 250–450)
UIBC: 379 ug/dL

## 2023-11-16 ENCOUNTER — Other Ambulatory Visit: Payer: Self-pay | Admitting: Physician Assistant

## 2023-11-20 NOTE — Progress Notes (Incomplete)
 [facility] [address] [place] [address]    Clinic Day:  11/20/2023  Referring physician: Charle Congo, MD  Patient Care Team: Charle Congo, MD as PCP - General (Internal Medicine) Tammie Fall, MD as PCP - Cardiology (Cardiology) Tammie Fall, MD (Cardiology) Early, Sherre Docker, MD (Inactive) as Attending Physician (Vascular Surgery) Baby Bolt, MD as Consulting Physician (Endocrinology) Jolly Needle, MD (Inactive) as Consulting Physician (Cardiology) Myrle Aspen, [facility] (Inactive) as Pharmacist (Pharmacist) Gerhard Knuckles, RN as Oncology Nurse Navigator (Oncology) Paulett Boros, MD as Medical Oncologist (Medical Oncology)   ASSESSMENT & PLAN:   Assessment: 1.  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 -Status post IMRT, 40 sessions completed on 06/14/2016 by Dr. Lorri Rota. -PSA 0.7 (10/15/2018), 0.8 (02/18/2019), 1.4 (December 2020), 1.4 (June 2021) -Prostate biopsy on 02/10/2020-1 out of 12 cores from left mid lateral region positive for adenocarcinoma. -CTAP on 03/09/2020 with newly enlarged left iliac lymph nodes measuring 1.1 x 0.9 cm.  Hepatic steatosis with early signs of cirrhosis. -F-18 PSMA PET scan on 04/12/2020 showed radiotracer activity associated with enlarged left external iliac lymph node 9 mm with SUV 7.6.  Small left common iliac lymph node 6 mm, SUV 8.6.  Left periaortic lymph node 5 mm, SUV 6.3.  Lymph node between IVC and aorta at the level of right renal hilum, SUV 8.6.  Lymph node deep to the IVC measuring 6 mm, SUV 5.4.  No skeletal metastasis. 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 - Firmagon  started on 04/21/2020 - Apalutamide  240 milligrams daily started around 04/22/2020.   2.  Social/family history: -He is a retired Product manager.  He lives at home with his wife.  He plays golf.  He quit smoking in 2015, smoked 1 pack/day for more than 20 years. -Sister has CML.  Maternal grandmother had breast cancer.  Maternal uncle had lung cancer.  2 maternal cousins had tongue cancer and uterine cancer.   3.  Cirrhosis: -Prior imaging showed cirrhosis of the liver with normal spleen.    Plan: 1.  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  Last Eligard  45 mg injection on 06/25/2023. - He has occasional headaches but denies any skin rashes. - Labs from 08/08/2023: Normal LFTs.  PSA is less than 0.01. - Continue apalutamide  240 mg daily until progression.  RTC 12 weeks for follow-up with repeat labs and PSA.   2.  Normocytic anemia: - His last Feraheme  was in July 2024.  Hemoglobin decreased to 10.4 today.  Ferritin is low at 12 and percent saturation 9.  Denies any bleeding per rectum or melena. - Recommend INFeD  1 g IV x 1.  Discussed side effects including rare chance of anaphylactic reaction.   3.  Mild thrombocytopenia: - Mild thrombocytopenia on and off since December 2020 stable.  Current platelet count is normal at 150.   4.  Osteopenia (DEXA 11/15/2021 T score -1.6): - Continue vitamin D  50,000 units every other week. - We have also recommended Prolia.  He will have dental extractions done on 08/23/2023.  Will discuss at next visit.    No orders of the defined types were placed in this encounter.     Nadeen Augusta Teague,acting as a Neurosurgeon for Paulett Boros, MD.,have documented all relevant documentation on the  behalf of Paulett Boros, MD,as directed by  Paulett Boros, MD while in the presence of Paulett Boros, MD.  ***     [place] R Teague   6/17/202511:58 AM  CHIEF COMPLAINT:   Diagnosis: metastatic castration sensitive prostate cancer    Cancer Staging  No matching staging  information was found for the patient.    Prior Therapy: IMRT x 40 sessions completed on 06/14/2016   Current Therapy:  Firmagon  every month; Erleada  240 mg daily    HISTORY OF PRESENT ILLNESS:   Oncology History  Prostate cancer (HCC)  03/10/2016 Initial Diagnosis   Prostate cancer (HCC)   05/23/2020 Genetic Testing   Negative genetic testing:  No pathogenic variants detected on the [facility] Common Hereditary Cancers Panel + Prostate Cancer HRR Panel. A variant of uncertain significance (VUS) was detected in the NF1 gene called c.1178A>G. The report date is 05/23/2020.  The Common Hereditary Cancers Panel offered by [facility] includes sequencing and/or deletion duplication testing of the following 47 genes: APC, ATM, AXIN2, BARD1, BMPR1A, BRCA1, BRCA2, BRIP1, CDH1, CDK4, CDKN2A (p14ARF), CDKN2A (p16INK4a), CHEK2, CTNNA1, DICER1, EPCAM (Deletion/duplication testing only), GREM1 (promoter region deletion/duplication testing only), KIT, MEN1, MLH1, MSH2, MSH3, MSH6, MUTYH, NBN, NF1, NTHL1, PALB2, PDGFRA, PMS2, POLD1, POLE, PTEN, RAD50, RAD51C, RAD51D, SDHB, SDHC, SDHD, SMAD4, SMARCA4. STK11, TP53, TSC1, TSC2, and VHL.  The following genes were evaluated for sequence changes only: SDHA and HOXB13 c.251G>A variant only. The Prostate Cancer HRR Panel offered by [facility] includes sequencing and/or deletion duplication analysis of the following 10 genes: ATM, BARD1, BRCA1, BRCA2, BRIP1, CHEK2, FANCL, PALB2, RAD51C, RAD51D.      INTERVAL HISTORY:   Axiel is a 68 y.o. male seen for follow-up of metastatic castrate sensitive prostate cancer.  He was last seen by me on 08/14/23.  Today, he states that he is doing well overall. His appetite level is at ***%. His energy level is at ***%.  PAST MEDICAL HISTORY:   Past Medical History: Past Medical History:  Diagnosis Date   Ambulates with cane    Anemia    history of iron  infusions in 2024   Arteriosclerotic cardiovascular disease (ASCVD) 2005    catheterization in 10/2010:50% mid LAD, diffuse distal disease, circumflex irregularities, large dominant RCA with a 50% ostial, 70% distal, 60% posterolateral and 70% PDA; normal EF   Arthritis    Benign prostatic hypertrophy    Bilateral carpal tunnel syndrome 07/03/2018   Cerebrovascular disease 2010   R. carotid endarterectomy; Duplex in 10/2010-widely patent ICAs, subtotal left vertebral-not thought to be contributing to symptoms   Cervical spine disease    CT in 2012-advanced degeneration and spondylosis with moderate spinal stenosis at C3-C6   CHF (congestive heart failure) [facility])    Follows w/ cardiologist, Dr. Manya Sells.   Depression    Diabetes mellitus without complication (HCC)    Takes Farxiga .   Erectile dysfunction    Family history of breast cancer    Family history of cancer of mouth    Family history of CML (chronic monocytic leukemia)    Family history of lung cancer    Family history of ovarian cancer    Family history of stomach cancer    Full dentures    Wife states that he usually does not wear his dentures.   Gastroesophageal reflux disease    H/O hiatal hernia    H/O: substance abuse (HCC)    Cocaine, marijuana, alcohol.  Quit 2013.    Headache    Hyperlipidemia  Hypertension    Follows w/ cardiologist, Dr. Manya Sells.   Hypothyroidism    Follows w/ Hulon Magic, NP, endocrinology.   Non-ST elevation myocardial infarction (NSTEMI), initial episode of care [facility]) 12/02/2013   DES LAD   Obesity    Presence of permanent cardiac pacemaker    Prostate cancer (HCC)    Sleep apnea    CPAP   Tachy-brady syndrome (HCC)    a. 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 25, 2010    CEA   COLONOSCOPY WITH PROPOFOL  N/A 06/24/2021   Procedure: COLONOSCOPY WITH PROPOFOL ;  Surgeon: Alvis Jourdain, MD;  Location: [facility] ENDOSCOPY;  Service: Endoscopy;  Laterality: N/A;   CORONARY ANGIOPLASTY WITH STENT PLACEMENT  12/03/2013   LAD 90%-->0% W/ Promus Premier DES 3.0 mm x 16 mm, CFX OK, RCA 40%, EF 70-75%   CYSTOSCOPY WITH FULGERATION N/A 06/21/2023   Procedure: CYSTOSCOPY WITH FULGERATION and clot evacuation;  Surgeon: Trent Frizzle, MD;  Location: [facility];  Service: Urology;  Laterality: N/A;   LEFT ATRIAL APPENDAGE OCCLUSION N/A 08/05/2015   Procedure: LEFT ATRIAL APPENDAGE OCCLUSION;  Surgeon: Jolly Needle, MD;  Location: [facility] INVASIVE CV LAB;  Service: Cardiovascular;  Laterality: N/A;   LEFT HEART CATH AND CORONARY ANGIOGRAPHY N/A 09/13/2021   Procedure: LEFT HEART CATH AND CORONARY ANGIOGRAPHY;  Surgeon: Kyra Phy, MD;  Location: [facility] INVASIVE CV LAB;  Service: Cardiovascular;  Laterality: N/A;   LEFT HEART CATHETERIZATION WITH CORONARY ANGIOGRAM Left 12/03/2013   Procedure: LEFT HEART CATHETERIZATION WITH CORONARY ANGIOGRAM;  Surgeon: Arleen Lacer, MD;  Location: [facility] CATH LAB;  Service: Cardiovascular;  Laterality: Left;   LEFT HEART CATHETERIZATION WITH CORONARY ANGIOGRAM N/A 01/26/2014   Procedure: LEFT HEART CATHETERIZATION WITH CORONARY ANGIOGRAM;  Surgeon: Lucendia Rusk, MD;  Location: [facility] CATH LAB;  Service: Cardiovascular;  Laterality: N/A;   LEFT HEART CATHETERIZATION WITH CORONARY ANGIOGRAM N/A 08/03/2014   Procedure: LEFT HEART CATHETERIZATION WITH CORONARY ANGIOGRAM;  Surgeon: Odie Benne, MD;  Location: [facility] CATH LAB;  Service: Cardiovascular;  Laterality: N/A;   PERCUTANEOUS CORONARY STENT INTERVENTION (PCI-S)  12/03/2013   Procedure: PERCUTANEOUS CORONARY STENT  INTERVENTION (PCI-S);  Surgeon: Arleen Lacer, MD;  Location: [facility] CATH LAB;  Service: Cardiovascular;;   PERMANENT PACEMAKER INSERTION N/A 09/18/2014   Procedure: PERMANENT PACEMAKER INSERTION;  Surgeon: Tammie Fall, MD;  Location: [facility] CATH LAB;  Service: Cardiovascular;  Laterality: N/A;   POLYPECTOMY  06/24/2021   Procedure: POLYPECTOMY;  Surgeon: Alvis Jourdain, MD;  Location: [facility] ENDOSCOPY;  Service: Endoscopy;;   RADIOFREQUENCY ABLATION  2005   for PSVT   TEE WITHOUT CARDIOVERSION N/A 07/27/2015   Procedure: TRANSESOPHAGEAL ECHOCARDIOGRAM (TEE);  Surgeon: Lenise Quince, MD;  Location: [facility] ENDOSCOPY;  Service: Cardiovascular;  Laterality: N/A;   TEE WITHOUT CARDIOVERSION N/A 09/15/2015   Procedure: TRANSESOPHAGEAL ECHOCARDIOGRAM (TEE);  Surgeon: Lake Pilgrim, MD;  Location: [facility] ENDOSCOPY;  Service: Cardiovascular;  Laterality: N/A;    Social History: Social  History   Socioeconomic History   Marital status: Married    Spouse name: Not on file   Number of children: 0   Years of education: Not on file   Highest education level: Not on file  Occupational History   Occupation: Retired  Tobacco Use   Smoking status: Former    Current packs/day: 0.00    Average packs/day: 1 pack/day for 40.0 years (40.0 ttl pk-yrs)    Types: Cigarettes    Start date: 10/20/1972    Quit date: 10/10/2012    Years since quitting: 11.1   Smokeless tobacco: Never   Tobacco comments:    Quit in May.   Vaping Use   Vaping status: Never Used  Substance and Sexual Activity   Alcohol use: Not Currently    Comment: former drinker-- sober since 2013.    Drug use: Not Currently    Types: Cocaine    Comment: quit cocaine 10/2011   Sexual activity: Yes    Partners: Female  Other Topics Concern   Not on file  Social History Narrative   Lives in [place].   Social Drivers of Corporate investment banker Strain: Low Risk  (04/19/2020)   Overall Financial Resource Strain (CARDIA)    Difficulty of  Paying Living Expenses: Not hard at all  Food Insecurity: No Food Insecurity (06/08/2023)   Hunger Vital Sign    Worried About Running Out of Food in the Last Year: Never true    Ran Out of Food in the Last Year: Never true  Transportation Needs: No Transportation Needs (06/08/2023)   PRAPARE - Administrator, Civil Service (Medical): No    Lack of Transportation (Non-Medical): No  Physical Activity: Inactive (04/19/2020)   Exercise Vital Sign    Days of Exercise per Week: 0 days    Minutes of Exercise per Session: 0 min  Stress: No Stress Concern Present (04/19/2020)   [employer] of Occupational Health - Occupational Stress Questionnaire    Feeling of Stress : Not at all  Social Connections: Socially Integrated (06/08/2023)   Social Connection and Isolation Panel    Frequency of Communication with Friends and Family: More than three times a week    Frequency of Social Gatherings with Friends and Family: Three times a week    Attends Religious Services: More than 4 times per year    Active Member of Clubs or Organizations: Yes    Attends Banker Meetings: More than 4 times per year    Marital Status: Married  Catering manager Violence: Not At Risk (06/08/2023)   Humiliation, Afraid, Rape, and Kick questionnaire    Fear of Current or Ex-Partner: No    Emotionally Abused: No    Physically Abused: No    Sexually Abused: No    Family History: Family History  Problem Relation Age of Onset   Hypertension Mother        Cerebrovascular disease   Diabetes Mother    Coronary artery disease Father 38   Diabetes type II Father    Hypertension Father    Heart attack Father    Diabetes Brother    Hypertension Brother    Diabetes Sister    Hypertension Sister    Heart attack Sister 35   Leukemia Sister 49       CML   Breast cancer Maternal Grandmother        dx 45s   Lung cancer Maternal Uncle  dx >50, smoker   Cancer Cousin        mouth cancer, dx  18s, no smoking/chew tobacco hx (maternal 1st cousin)   Ovarian cancer Cousin        dx <50 (maternal 1st cousin)   Stomach cancer Cousin        dx 87s (maternal 1st cousin)   Cancer Cousin        type unknown to pt, dx >50 (paternal 1st cousin)    Current Medications:  Current Outpatient Medications:    acetaminophen  (TYLENOL ) 500 MG tablet, Take 500-1,000 mg by mouth every 6 (six) hours as needed for moderate pain (pain score 4-6)., Disp: , Rfl:    allopurinol  (ZYLOPRIM ) 100 MG tablet, TAKE 1 TABLET(100 MG) BY MOUTH DAILY, Disp: 90 tablet, Rfl: 0   apalutamide  (ERLEADA ) 60 MG tablet, TAKE 4 TABLETS (240 MG TOTAL) BY MOUTH DAILY. MAY BE TAKEN WITH OR WITHOUT FOOD. SWALLOW TABLETS WHOLE., Disp: 120 tablet, Rfl: 3   Cyanocobalamin (VITAMIN B 12) 500 MCG TABS, Take 500 mcg by mouth in the morning., Disp: , Rfl:    FARXIGA  5 MG TABS tablet, Take 1 tablet (5 mg total) by mouth daily., Disp: 90 tablet, Rfl: 3   finasteride  (PROSCAR ) 5 MG tablet, TAKE 1 TABLET(5 MG) BY MOUTH DAILY, Disp: 30 tablet, Rfl: 5   fluticasone  (FLONASE ) 50 MCG/ACT nasal spray, Place 2 sprays into both nostrils daily. (Patient taking differently: Place 2 sprays into both nostrils daily as needed for allergies or rhinitis.), Disp: 16 g, Rfl: 6   isosorbide  mononitrate (IMDUR ) 30 MG 24 hr tablet, TAKE 1 TABLET(30 MG) BY MOUTH DAILY, Disp: 90 tablet, Rfl: 0   levothyroxine  (SYNTHROID ) 200 MCG tablet, Take 1 tablet (200 mcg total) by mouth daily before breakfast., Disp: 90 tablet, Rfl: 3   levothyroxine  (SYNTHROID ) 25 MCG tablet, Take 1 tablet (25 mcg total) by mouth See admin instructions. Takes along with 200 mcg to total 225 mcg daily, Disp: 90 tablet, Rfl: 3   LINZESS 145 MCG CAPS capsule, Take 145 mcg by mouth every morning., Disp: , Rfl:    loratadine  (CLARITIN ) 10 MG tablet, TAKE 1 TABLET(10 MG) BY MOUTH DAILY AS NEEDED FOR ALLERGIES, Disp: 30 tablet, Rfl: 2   naloxone (NARCAN) nasal spray 4 mg/0.1 mL, Place 4 mg into  the nose once as needed (accidental overdose)., Disp: , Rfl:    nitroGLYCERIN  (NITROSTAT ) 0.4 MG SL tablet, PLACE 1 TABLET UNDER THE TONGUE EVERY 5 MINUTES AS NEEDED FOR CHEST AIN CALL 911 AT THIRD DOSE IN 15 MINUTES, Disp: 25 tablet, Rfl: 11   ONETOUCH VERIO test strip, daily., Disp: , Rfl:    oxyCODONE -acetaminophen  (PERCOCET) 10-325 MG tablet, Take 1 tablet by mouth every 8 (eight) hours as needed for pain., Disp: 90 tablet, Rfl: 0   pantoprazole  (PROTONIX ) 40 MG tablet, TAKE 1 TABLET(40 MG) BY MOUTH DAILY, Disp: 30 tablet, Rfl: 3   phenazopyridine  (PYRIDIUM ) 100 MG tablet, Take 1 tablet (100 mg total) by mouth 3 (three) times daily with meals., Disp: 10 tablet, Rfl: 0   rosuvastatin  (CRESTOR ) 40 MG tablet, Take 40 mg by mouth every evening., Disp: , Rfl:    silodosin  (RAPAFLO ) 8 MG CAPS capsule, TAKE 1 CAPSULE BY MOUTH TWICE DAILY, Disp: 180 capsule, Rfl: 3   solifenacin  (VESICARE ) 10 MG tablet, TAKE 1 TABLET(10 MG) BY MOUTH DAILY, Disp: 30 tablet, Rfl: 11   sotalol  (BETAPACE ) 80 MG tablet, TAKE 1 TABLET(80 MG) BY MOUTH TWICE DAILY, Disp: 180 tablet,  Rfl: 0   Vibegron (GEMTESA) 75 MG TABS, Take 75 mg by mouth every Monday, Wednesday, and Friday., Disp: , Rfl:    Vitamin D , Ergocalciferol , (DRISDOL ) 1.25 MG (50000 UNIT) CAPS capsule, TAKE 1 CAPSULE BY MOUTH EVERY 7 DAYS (Patient taking differently: Take 50,000 Units by mouth every 14 (fourteen) days.), Disp: 12 capsule, Rfl: 0   Allergies: Allergies  Allergen Reactions   Lactose Intolerance (Gi) Other (See Comments)    UPSET STOMACH    Trazodone  And Nefazodone     Nightmares    REVIEW OF SYSTEMS:   Review of Systems  Constitutional:  Negative for chills, fatigue and fever.  HENT:   Negative for lump/mass, mouth sores, nosebleeds, sore throat and trouble swallowing.   Eyes:  Negative for eye problems.  Respiratory:  Negative for cough and shortness of breath.   Cardiovascular:  Negative for chest pain, leg swelling and palpitations.   Gastrointestinal:  Negative for abdominal pain, constipation, diarrhea, nausea and vomiting.  Genitourinary:  Negative for bladder incontinence, difficulty urinating, dysuria, frequency, hematuria and nocturia.   Musculoskeletal:  Negative for arthralgias, back pain, flank pain, myalgias and neck pain.  Skin:  Negative for itching and rash.  Neurological:  Negative for dizziness, headaches and numbness.  Hematological:  Does not bruise/bleed easily.  Psychiatric/Behavioral:  Negative for depression, sleep disturbance and suicidal ideas. The patient is not nervous/anxious.   All other systems reviewed and are negative.    VITALS:   There were no vitals taken for this visit.  Wt Readings from Last 3 Encounters:  08/22/23 284 lb 3.2 oz (128.9 kg)  08/14/23 289 lb 0.4 oz (131.1 kg)  06/21/23 278 lb 4.8 oz (126.2 kg)    There is no height or weight on file to calculate BMI.  Performance status (ECOG): 1 - Symptomatic but completely ambulatory  PHYSICAL EXAM:   Physical Exam Vitals and nursing note reviewed. Exam conducted with a chaperone present.  Constitutional:      Appearance: Normal appearance.   Cardiovascular:     Rate and Rhythm: Normal rate and regular rhythm.     Pulses: Normal pulses.     Heart sounds: Normal heart sounds.  Pulmonary:     Effort: Pulmonary effort is normal.     Breath sounds: Normal breath sounds.  Abdominal:     Palpations: Abdomen is soft. There is no hepatomegaly, splenomegaly or mass.     Tenderness: There is no abdominal tenderness.   Musculoskeletal:     Right lower leg: No edema.     Left lower leg: No edema.  Lymphadenopathy:     Cervical: No cervical adenopathy.     Right cervical: No superficial, deep or posterior cervical adenopathy.    Left cervical: No superficial, deep or posterior cervical adenopathy.     Upper Body:     Right upper body: No supraclavicular or axillary adenopathy.     Left upper body: No supraclavicular or  axillary adenopathy.   Neurological:     General: No focal deficit present.     Mental Status: He is alert and oriented to person, place, and time.   Psychiatric:        Mood and Affect: Mood normal.        Behavior: Behavior normal.     LABS:      Latest Ref Rng & Units 11/14/2023    2:14 PM 08/08/2023   12:45 PM 06/21/2023   10:44 AM  CBC  WBC 4.0 - 10.5  K/uL 6.4  6.2    Hemoglobin 13.0 - 17.0 g/dL 16.1  09.6  04.5   Hematocrit 39.0 - 52.0 % 38.3  34.4  34.0   Platelets 150 - 400 K/uL 121  150        Latest Ref Rng & Units 11/14/2023    2:14 PM 08/15/2023   10:05 AM 08/08/2023   12:45 PM  CMP  Glucose 70 - 99 mg/dL 409  811  914   BUN 8 - 23 mg/dL 12  13  11    Creatinine 0.61 - 1.24 mg/dL 7.82  9.56  2.13   Sodium 135 - 145 mmol/L 139  140  138   Potassium 3.5 - 5.1 mmol/L 4.1  4.2  4.1   Chloride 98 - 111 mmol/L 109  107  108   CO2 22 - 32 mmol/L 20  18  21    Calcium  8.9 - 10.3 mg/dL 9.7  9.3  8.9   Total Protein 6.5 - 8.1 g/dL 7.6  7.2  7.4   Total Bilirubin 0.0 - 1.2 mg/dL 0.6  0.5  0.6   Alkaline Phos 38 - 126 U/L 92  109  82   AST 15 - 41 U/L 22  15  19    ALT 0 - 44 U/L 10  6  8       No results found for: CEA1, CEA / No results found for: CEA1, CEA No results found for: PSA1 No results found for: [id number] No results found for: CAN125  No results found for: TOTALPROTELP, ALBUMINELP, A1GS, A2GS, BETS, BETA2SER, GAMS, MSPIKE, SPEI Lab Results  Component Value Date   TIBC 429 11/14/2023   TIBC 460 (H) 08/08/2023   TIBC 348 02/12/2023   FERRITIN 32 11/14/2023   FERRITIN 12 (L) 08/08/2023   FERRITIN 153 02/12/2023   IRONPCTSAT 12 (L) 11/14/2023   IRONPCTSAT 9 (L) 08/08/2023   IRONPCTSAT 25 02/12/2023   No results found for: LDH   STUDIES:   CUP PACEART REMOTE DEVICE CHECK Result Date: 11/07/2023 PPM Scheduled remote reviewed. Normal device function.  Presenting rhythm: AS-VP, AP-VP, AP-VS. 4 AHR detections, longest 3 min 20  sec, EGMs consistent with AFL, known history, Watchman implanted per Epic. Next remote 91 days. - CS, CVRS

## 2023-11-21 ENCOUNTER — Inpatient Hospital Stay: Admitting: Hematology

## 2023-11-21 ENCOUNTER — Other Ambulatory Visit: Payer: Self-pay

## 2023-11-23 ENCOUNTER — Other Ambulatory Visit: Payer: Self-pay

## 2023-11-23 ENCOUNTER — Other Ambulatory Visit: Payer: Self-pay | Admitting: Pharmacy Technician

## 2023-11-23 NOTE — Progress Notes (Signed)
 Specialty Pharmacy Refill Coordination Note  Peter Dunlap is a 68 y.o. male contacted today regarding refills of specialty medication(s) Apalutamide  (ERLEADA )  Spoke with Wife  Patient requested Delivery   Delivery date: 11/28/23   Verified address: [name] [name], [place]   Medication will be filled on 11/27/23.

## 2023-11-26 ENCOUNTER — Inpatient Hospital Stay (HOSPITAL_BASED_OUTPATIENT_CLINIC_OR_DEPARTMENT_OTHER): Admitting: Hematology

## 2023-11-26 VITALS — BP 135/76 | HR 64 | Temp 97.9°F | Resp 16 | Wt 286.2 lb

## 2023-11-26 DIAGNOSIS — D649 Anemia, unspecified: Secondary | ICD-10-CM

## 2023-11-26 DIAGNOSIS — Z191 Hormone sensitive malignancy status: Secondary | ICD-10-CM | POA: Diagnosis not present

## 2023-11-26 DIAGNOSIS — C61 Malignant neoplasm of prostate: Secondary | ICD-10-CM | POA: Diagnosis not present

## 2023-11-26 DIAGNOSIS — Z5111 Encounter for antineoplastic chemotherapy: Secondary | ICD-10-CM | POA: Diagnosis not present

## 2023-11-26 NOTE — Progress Notes (Signed)
Patient is taking Erleada as prescribed.  He has not missed any doses and reports no side effects at this time.   

## 2023-11-26 NOTE — Progress Notes (Signed)
 [facility] [address] [place] [address]    Clinic Day:  11/26/2023  Referring physician: Shelda Atlas, MD  Patient Care Team: Shelda Atlas, MD as PCP - General (Internal Medicine) Waddell Danelle ORN, MD as PCP - Cardiology (Cardiology) Waddell Danelle ORN, MD (Cardiology) Early, Krystal FALCON, MD (Inactive) as Attending Physician (Vascular Surgery) Lenis Ethelle ORN, MD as Consulting Physician (Endocrinology) Kelsie Agent, MD (Inactive) as Consulting Physician (Cardiology) Nicholaus Sherlean CROME, [facility] (Inactive) as Pharmacist (Pharmacist) Celestia Joesph SQUIBB, RN as Oncology Nurse Navigator (Oncology) Rogers Hai, MD as Medical Oncologist (Medical Oncology)   ASSESSMENT & PLAN:   Assessment: 1.  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 -Status post IMRT, 40 sessions completed on 06/14/2016 by Dr. Patrcia. -PSA 0.7 (10/15/2018), 0.8 (02/18/2019), 1.4 (December 2020), 1.4 (June 2021) -Prostate biopsy on 02/10/2020-1 out of 12 cores from left mid lateral region positive for adenocarcinoma. -CTAP on 03/09/2020 with newly enlarged left iliac lymph nodes measuring 1.1 x 0.9 cm.  Hepatic steatosis with early signs of cirrhosis. -F-18 PSMA PET scan on 04/12/2020 showed radiotracer activity associated with enlarged left external iliac lymph node 9 mm with SUV 7.6.  Small left common iliac lymph node 6 mm, SUV 8.6.  Left periaortic lymph node 5 mm, SUV 6.3.  Lymph node between IVC and aorta at the level of right renal hilum, SUV 8.6.  Lymph node deep to the IVC measuring 6 mm, SUV 5.4.  No skeletal metastasis. 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 - Firmagon  started on 04/21/2020 - Apalutamide  240 milligrams daily started around 04/22/2020.   2.  Social/family history: -He is a retired Product manager.  He lives at home with his wife.  He plays golf.  He quit smoking in 2015, smoked 1 pack/day for more than 20 years. -Sister has CML.  Maternal grandmother had breast cancer.  Maternal uncle had lung cancer.  2 maternal cousins had tongue cancer and uterine cancer.   3.  Cirrhosis: -Prior imaging showed cirrhosis of the liver with normal spleen.    Plan: 1.  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  Last Eligard  45 mg injection on 06/25/2023. - reviewed labs from 11/14/23. PSA <0.01. Eligard  next month. Continue Erleada  240mg  daily to progression. RTC 3 months w/ repeat labs and PSA.   2.  Normocytic anemia: - last Infed  on 3/21. Ferritin is 32. C/o fatigue. Will give 1 more dose of Infed .   3.  Mild thrombocytopenia: - Mild thrombocytopenia on and off since December 2020 stable. Platelets today are 121.   4.  Osteopenia (DEXA 11/15/2021 T score -1.6): - Continue vitamin D  50,000 units every other week. - He had dental extractions 2 months ago. We talked about Prolia q 6 months to prevent worsening bone mineral density. He is agreeable.    Orders Placed This Encounter  Procedures   CBC with Differential    Standing Status:   Future    Expected Date:   02/25/2024    Expiration Date:   05/25/2024   Comprehensive metabolic panel    Standing Status:   Future    Expected Date:   02/25/2024    Expiration Date:   05/25/2024   PSA    Standing Status:   Future    Expected Date:  02/25/2024    Expiration Date:   05/25/2024   Iron  and TIBC ([facility] ONLY)    Standing Status:   Future    Expected Date:   02/25/2024    Expiration Date:   05/25/2024   Ferritin    Standing Status:   Future    Expected Date:   02/25/2024    Expiration Date:   05/25/2024      LILLETTE Hummingbird R Teague,acting as a scribe for Alean Stands, MD.,have documented all relevant documentation on  the behalf of Alean Stands, MD,as directed by  Alean Stands, MD while in the presence of Alean Stands, MD.  I, Alean Stands MD, have reviewed the above documentation for accuracy and completeness, and I agree with the above.      Alean Stands, MD   6/23/20251:52 PM  CHIEF COMPLAINT:   Diagnosis: metastatic castration sensitive prostate cancer    Cancer Staging  No matching staging information was found for the patient.    Prior Therapy: IMRT x 40 sessions completed on 06/14/2016   Current Therapy:  Firmagon  every month; Erleada  240 mg daily    HISTORY OF PRESENT ILLNESS:   Oncology History  Prostate cancer (HCC)  03/10/2016 Initial Diagnosis   Prostate cancer (HCC)   05/23/2020 Genetic Testing   Negative genetic testing:  No pathogenic variants detected on the [facility] Common Hereditary Cancers Panel + Prostate Cancer HRR Panel. A variant of uncertain significance (VUS) was detected in the NF1 gene called c.1178A>G. The report date is 05/23/2020.  The Common Hereditary Cancers Panel offered by [facility] includes sequencing and/or deletion duplication testing of the following 47 genes: APC, ATM, AXIN2, BARD1, BMPR1A, BRCA1, BRCA2, BRIP1, CDH1, CDK4, CDKN2A (p14ARF), CDKN2A (p16INK4a), CHEK2, CTNNA1, DICER1, EPCAM (Deletion/duplication testing only), GREM1 (promoter region deletion/duplication testing only), KIT, MEN1, MLH1, MSH2, MSH3, MSH6, MUTYH, NBN, NF1, NTHL1, PALB2, PDGFRA, PMS2, POLD1, POLE, PTEN, RAD50, RAD51C, RAD51D, SDHB, SDHC, SDHD, SMAD4, SMARCA4. STK11, TP53, TSC1, TSC2, and VHL.  The following genes were evaluated for sequence changes only: SDHA and HOXB13 c.251G>A variant only. The Prostate Cancer HRR Panel offered by [facility] includes sequencing and/or deletion duplication analysis of the following 10 genes: ATM, BARD1, BRCA1, BRCA2, BRIP1, CHEK2, FANCL, PALB2, RAD51C, RAD51D.      INTERVAL HISTORY:   Gustin is a 68 y.o. male  seen for follow-up of metastatic castrate sensitive prostate cancer.  He was last seen by me on 08/14/23.  Today, he states that he is doing well overall. His appetite level is at 100%. His energy level is at 70%.   PAST MEDICAL HISTORY:   Past Medical History: Past Medical History:  Diagnosis Date   Ambulates with cane    Anemia    history of iron  infusions in 2024   Arteriosclerotic cardiovascular disease (ASCVD) 2005   catheterization in 10/2010:50% mid LAD, diffuse distal disease, circumflex irregularities, large dominant RCA with a 50% ostial, 70% distal, 60% posterolateral and 70% PDA; normal EF   Arthritis    Benign prostatic hypertrophy    Bilateral carpal tunnel syndrome 07/03/2018   Cerebrovascular disease 2010   R. carotid endarterectomy; Duplex in 10/2010-widely patent ICAs, subtotal left vertebral-not thought to be contributing to symptoms   Cervical spine disease    CT in 2012-advanced degeneration and spondylosis with moderate spinal stenosis at C3-C6   CHF (congestive heart failure) [facility])    Follows w/ cardiologist, Dr. Danelle Birmingham.   Depression    Diabetes mellitus without complication (HCC)    Takes  Farxiga .   Erectile dysfunction    Family history of breast cancer    Family history of cancer of mouth    Family history of CML (chronic monocytic leukemia)    Family history of lung cancer    Family history of ovarian cancer    Family history of stomach cancer    Full dentures    Wife states that he usually does not wear his dentures.   Gastroesophageal reflux disease    H/O hiatal hernia    H/O: substance abuse (HCC)    Cocaine, marijuana, alcohol.  Quit 2013.    Headache    Hyperlipidemia    Hypertension    Follows w/ cardiologist, Dr. Danelle Birmingham.   Hypothyroidism    Follows w/ Benton Rio, NP, endocrinology.   Non-ST elevation myocardial infarction (NSTEMI), initial episode of care [facility]) 12/02/2013   DES LAD   Obesity    Presence of permanent  cardiac pacemaker    Prostate cancer (HCC)    Sleep apnea    CPAP   Tachy-brady syndrome (HCC)    a. 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 25, 2010    CEA   COLONOSCOPY WITH PROPOFOL  N/A 06/24/2021   Procedure: COLONOSCOPY WITH PROPOFOL ;  Surgeon: Rollin Dover, MD;  Location: [facility] ENDOSCOPY;  Service: Endoscopy;  Laterality: N/A;   CORONARY ANGIOPLASTY WITH STENT PLACEMENT  12/03/2013   LAD 90%-->0% W/ Promus Premier DES 3.0 mm x 16 mm, CFX OK, RCA 40%, EF 70-75%   CYSTOSCOPY WITH FULGERATION N/A 06/21/2023   Procedure: CYSTOSCOPY WITH FULGERATION and clot evacuation;  Surgeon: Matilda Senior, MD;  Location: [facility];  Service: Urology;  Laterality: N/A;   LEFT ATRIAL APPENDAGE OCCLUSION N/A 08/05/2015   Procedure: LEFT ATRIAL APPENDAGE OCCLUSION;  Surgeon: Lynwood Rakers, MD;  Location: [facility] INVASIVE CV LAB;  Service: Cardiovascular;  Laterality: N/A;   LEFT HEART CATH AND CORONARY ANGIOGRAPHY N/A 09/13/2021   Procedure: LEFT HEART CATH AND CORONARY ANGIOGRAPHY;  Surgeon: Wendel Lurena POUR, MD;  Location: [facility] INVASIVE CV LAB;  Service: Cardiovascular;  Laterality: N/A;   LEFT HEART CATHETERIZATION WITH CORONARY ANGIOGRAM Left 12/03/2013   Procedure: LEFT HEART CATHETERIZATION WITH CORONARY ANGIOGRAM;  Surgeon: Alm LELON Clay, MD;  Location: [facility] CATH LAB;  Service: Cardiovascular;  Laterality: Left;   LEFT HEART CATHETERIZATION WITH CORONARY ANGIOGRAM N/A 01/26/2014   Procedure: LEFT HEART CATHETERIZATION WITH CORONARY ANGIOGRAM;  Surgeon: Candyce GORMAN Reek, MD;  Location: [facility] CATH LAB;  Service: Cardiovascular;  Laterality: N/A;   LEFT HEART CATHETERIZATION WITH CORONARY ANGIOGRAM N/A 08/03/2014   Procedure: LEFT HEART CATHETERIZATION WITH CORONARY ANGIOGRAM;  Surgeon: Lonni JONETTA Cash, MD;  Location: [facility] CATH LAB;  Service: Cardiovascular;  Laterality: N/A;   PERCUTANEOUS CORONARY STENT INTERVENTION (PCI-S)  12/03/2013   Procedure: PERCUTANEOUS CORONARY STENT INTERVENTION (PCI-S);  Surgeon: Alm LELON Clay, MD;  Location: [facility] CATH LAB;  Service: Cardiovascular;;   PERMANENT PACEMAKER INSERTION N/A 09/18/2014   Procedure: PERMANENT PACEMAKER INSERTION;  Surgeon: Danelle LELON Birmingham, MD;  Location: [facility] CATH LAB;  Service: Cardiovascular;  Laterality: N/A;   POLYPECTOMY  06/24/2021   Procedure: POLYPECTOMY;  Surgeon: Rollin Dover, MD;  Location: [facility] ENDOSCOPY;  Service: Endoscopy;;   RADIOFREQUENCY ABLATION  2005   for PSVT   TEE WITHOUT CARDIOVERSION N/A 07/27/2015   Procedure: TRANSESOPHAGEAL ECHOCARDIOGRAM (TEE);  Surgeon: Redell GORMAN Shallow, MD;  Location: [facility] ENDOSCOPY;  Service: Cardiovascular;  Laterality: N/A;   TEE WITHOUT CARDIOVERSION N/A 09/15/2015   Procedure: TRANSESOPHAGEAL ECHOCARDIOGRAM (TEE);  Surgeon: Aleene JINNY Passe, MD;  Location: [facility] ENDOSCOPY;  Service: Cardiovascular;  Laterality: N/A;    Social History: Social History   Socioeconomic History   Marital status: Married    Spouse name: Not on file   Number of children: 0   Years of education: Not on file   Highest education level: Not on file  Occupational History   Occupation: Retired  Tobacco Use   Smoking status: Former    Current packs/day: 0.00    Average packs/day: 1 pack/day for 40.0 years (40.0 ttl pk-yrs)    Types: Cigarettes    Start date: 10/20/1972    Quit date: 10/10/2012    Years since quitting: 11.1   Smokeless tobacco: Never   Tobacco comments:    Quit in May.   Vaping Use   Vaping status: Never Used  Substance and Sexual Activity   Alcohol use:  Not Currently    Comment: former drinker-- sober since 2013.    Drug use: Not Currently    Types: Cocaine    Comment: quit cocaine 10/2011   Sexual activity: Yes    Partners: Female  Other Topics Concern   Not on file  Social History Narrative   Lives in [place].   Social Drivers of Corporate investment banker Strain: Low Risk  (04/19/2020)   Overall Financial Resource Strain (CARDIA)    Difficulty of Paying Living Expenses: Not hard at all  Food Insecurity: No Food Insecurity (06/08/2023)   Hunger Vital Sign    Worried About Running Out of Food in the Last Year: Never true    Ran Out of Food in the Last Year: Never true  Transportation Needs: No Transportation Needs (06/08/2023)   PRAPARE - Administrator, Civil Service (Medical): No    Lack of Transportation (Non-Medical): No  Physical Activity: Inactive (04/19/2020)   Exercise Vital Sign    Days of Exercise per Week: 0 days    Minutes of Exercise per Session: 0 min  Stress: No Stress Concern Present (04/19/2020)   [employer] of Occupational Health - Occupational Stress Questionnaire    Feeling of Stress : Not at all  Social Connections: Socially Integrated (06/08/2023)   Social Connection and Isolation Panel    Frequency of Communication with Friends and Family: More than three times a week    Frequency of Social Gatherings with Friends and Family: Three times a week    Attends Religious Services: More than 4 times per year    Active Member of Clubs or Organizations: Yes    Attends Banker Meetings: More than 4 times per year    Marital Status: Married  Catering manager Violence: Not At Risk (06/08/2023)   Humiliation, Afraid, Rape, and Kick questionnaire    Fear of Current or Ex-Partner: No    Emotionally Abused: No    Physically Abused: No    Sexually Abused: No    Family History: Family History  Problem  Relation Age of Onset   Hypertension Mother        Cerebrovascular disease    Diabetes Mother    Coronary artery disease Father 32   Diabetes type II Father    Hypertension Father    Heart attack Father    Diabetes Brother    Hypertension Brother    Diabetes Sister    Hypertension Sister    Heart attack Sister 35   Leukemia Sister 10       CML   Breast cancer Maternal Grandmother        dx 9s   Lung cancer Maternal Uncle        dx >50, smoker   Cancer Cousin        mouth cancer, dx 13s, no smoking/chew tobacco hx (maternal 1st cousin)   Ovarian cancer Cousin        dx <50 (maternal 1st cousin)   Stomach cancer Cousin        dx 56s (maternal 1st cousin)   Cancer Cousin        type unknown to pt, dx >50 (paternal 1st cousin)    Current Medications:  Current Outpatient Medications:    acetaminophen  (TYLENOL ) 500 MG tablet, Take 500-1,000 mg by mouth every 6 (six) hours as needed for moderate pain (pain score 4-6)., Disp: , Rfl:    allopurinol  (ZYLOPRIM ) 100 MG tablet, TAKE 1 TABLET(100 MG) BY MOUTH DAILY, Disp: 90 tablet, Rfl: 0   apalutamide  (ERLEADA ) 60 MG tablet, TAKE 4 TABLETS (240 MG TOTAL) BY MOUTH DAILY. MAY BE TAKEN WITH OR WITHOUT FOOD. SWALLOW TABLETS WHOLE., Disp: 120 tablet, Rfl: 3   Cyanocobalamin (VITAMIN B 12) 500 MCG TABS, Take 500 mcg by mouth in the morning., Disp: , Rfl:    FARXIGA  5 MG TABS tablet, Take 1 tablet (5 mg total) by mouth daily., Disp: 90 tablet, Rfl: 3   finasteride  (PROSCAR ) 5 MG tablet, TAKE 1 TABLET(5 MG) BY MOUTH DAILY, Disp: 30 tablet, Rfl: 5   fluticasone  (FLONASE ) 50 MCG/ACT nasal spray, Place 2 sprays into both nostrils daily. (Patient taking differently: Place 2 sprays into both nostrils daily as needed for allergies or rhinitis.), Disp: 16 g, Rfl: 6   isosorbide  mononitrate (IMDUR ) 30 MG 24 hr tablet, TAKE 1 TABLET(30 MG) BY MOUTH DAILY, Disp: 90 tablet, Rfl: 0   levothyroxine  (SYNTHROID ) 200 MCG tablet, Take 1 tablet (200 mcg total) by mouth daily before breakfast., Disp: 90 tablet, Rfl: 3   levothyroxine   (SYNTHROID ) 25 MCG tablet, Take 1 tablet (25 mcg total) by mouth See admin instructions. Takes along with 200 mcg to total 225 mcg daily, Disp: 90 tablet, Rfl: 3   LINZESS 145 MCG CAPS capsule, Take 145 mcg by mouth every morning., Disp: , Rfl:    loratadine  (CLARITIN ) 10 MG tablet, TAKE 1 TABLET(10 MG) BY MOUTH DAILY AS NEEDED FOR ALLERGIES, Disp: 30 tablet, Rfl: 2   naloxone (NARCAN) nasal spray 4 mg/0.1 mL, Place 4 mg into the nose once as needed (accidental overdose)., Disp: , Rfl:    nitroGLYCERIN  (NITROSTAT ) 0.4 MG SL tablet, PLACE 1 TABLET UNDER THE TONGUE EVERY 5 MINUTES AS NEEDED FOR CHEST AIN CALL 911 AT THIRD DOSE IN 15 MINUTES, Disp: 25 tablet, Rfl: 11   ONETOUCH VERIO test strip, daily., Disp: , Rfl:    oxyCODONE -acetaminophen  (PERCOCET) 10-325 MG tablet, Take 1 tablet by mouth every 8 (eight) hours as needed for pain., Disp: 90 tablet, Rfl: 0   pantoprazole  (PROTONIX ) 40 MG tablet,  TAKE 1 TABLET(40 MG) BY MOUTH DAILY, Disp: 30 tablet, Rfl: 3   phenazopyridine  (PYRIDIUM ) 100 MG tablet, Take 1 tablet (100 mg total) by mouth 3 (three) times daily with meals., Disp: 10 tablet, Rfl: 0   rosuvastatin  (CRESTOR ) 40 MG tablet, Take 40 mg by mouth every evening., Disp: , Rfl:    silodosin  (RAPAFLO ) 8 MG CAPS capsule, TAKE 1 CAPSULE BY MOUTH TWICE DAILY, Disp: 180 capsule, Rfl: 3   solifenacin  (VESICARE ) 10 MG tablet, TAKE 1 TABLET(10 MG) BY MOUTH DAILY, Disp: 30 tablet, Rfl: 11   sotalol  (BETAPACE ) 80 MG tablet, TAKE 1 TABLET(80 MG) BY MOUTH TWICE DAILY, Disp: 180 tablet, Rfl: 0   Vibegron (GEMTESA) 75 MG TABS, Take 75 mg by mouth every Monday, Wednesday, and Friday., Disp: , Rfl:    Vitamin D , Ergocalciferol , (DRISDOL ) 1.25 MG (50000 UNIT) CAPS capsule, TAKE 1 CAPSULE BY MOUTH EVERY 7 DAYS (Patient taking differently: Take 50,000 Units by mouth every 14 (fourteen) days.), Disp: 12 capsule, Rfl: 0   Allergies: Allergies  Allergen Reactions   Lactose Intolerance (Gi) Other (See Comments)     UPSET STOMACH    Trazodone  And Nefazodone     Nightmares    REVIEW OF SYSTEMS:   Review of Systems  Constitutional:  Negative for chills, fatigue and fever.  HENT:   Negative for lump/mass, mouth sores, nosebleeds, sore throat and trouble swallowing.   Eyes:  Negative for eye problems.  Respiratory:  Negative for cough and shortness of breath.   Cardiovascular:  Negative for chest pain, leg swelling and palpitations.  Gastrointestinal:  Positive for constipation and diarrhea. Negative for abdominal pain, nausea and vomiting.  Genitourinary:  Negative for bladder incontinence, difficulty urinating, dysuria, frequency, hematuria and nocturia.   Musculoskeletal:  Negative for arthralgias, back pain, flank pain, myalgias and neck pain.  Skin:  Negative for itching and rash.  Neurological:  Positive for dizziness and headaches. Negative for numbness.  Hematological:  Does not bruise/bleed easily.  Psychiatric/Behavioral:  Negative for depression, sleep disturbance and suicidal ideas. The patient is not nervous/anxious.   All other systems reviewed and are negative.    VITALS:   Blood pressure 135/76, pulse 64, temperature 97.9 F (36.6 C), temperature source Oral, resp. rate 16, weight 286 lb 2.5 oz (129.8 kg), SpO2 95%.  Wt Readings from Last 3 Encounters:  11/26/23 286 lb 2.5 oz (129.8 kg)  08/22/23 284 lb 3.2 oz (128.9 kg)  08/14/23 289 lb 0.4 oz (131.1 kg)    Body mass index is 38.81 kg/m.  Performance status (ECOG): 1 - Symptomatic but completely ambulatory  PHYSICAL EXAM:   Physical Exam Vitals and nursing note reviewed. Exam conducted with a chaperone present.  Constitutional:      Appearance: Normal appearance.   Cardiovascular:     Rate and Rhythm: Normal rate and regular rhythm.     Pulses: Normal pulses.     Heart sounds: Normal heart sounds.  Pulmonary:     Effort: Pulmonary effort is normal.     Breath sounds: Normal breath sounds.  Abdominal:      Palpations: Abdomen is soft. There is no hepatomegaly, splenomegaly or mass.     Tenderness: There is no abdominal tenderness.   Musculoskeletal:     Right lower leg: No edema.     Left lower leg: No edema.  Lymphadenopathy:     Cervical: No cervical adenopathy.     Right cervical: No superficial, deep or posterior cervical adenopathy.  Left cervical: No superficial, deep or posterior cervical adenopathy.     Upper Body:     Right upper body: No supraclavicular or axillary adenopathy.     Left upper body: No supraclavicular or axillary adenopathy.   Neurological:     General: No focal deficit present.     Mental Status: He is alert and oriented to person, place, and time.   Psychiatric:        Mood and Affect: Mood normal.        Behavior: Behavior normal.     LABS:      Latest Ref Rng & Units 11/14/2023    2:14 PM 08/08/2023   12:45 PM 06/21/2023   10:44 AM  CBC  WBC 4.0 - 10.5 K/uL 6.4  6.2    Hemoglobin 13.0 - 17.0 g/dL 87.6  89.5  88.3   Hematocrit 39.0 - 52.0 % 38.3  34.4  34.0   Platelets 150 - 400 K/uL 121  150        Latest Ref Rng & Units 11/14/2023    2:14 PM 08/15/2023   10:05 AM 08/08/2023   12:45 PM  CMP  Glucose 70 - 99 mg/dL 868  839  878   BUN 8 - 23 mg/dL 12  13  11    Creatinine 0.61 - 1.24 mg/dL 8.77  8.84  8.83   Sodium 135 - 145 mmol/L 139  140  138   Potassium 3.5 - 5.1 mmol/L 4.1  4.2  4.1   Chloride 98 - 111 mmol/L 109  107  108   CO2 22 - 32 mmol/L 20  18  21    Calcium  8.9 - 10.3 mg/dL 9.7  9.3  8.9   Total Protein 6.5 - 8.1 g/dL 7.6  7.2  7.4   Total Bilirubin 0.0 - 1.2 mg/dL 0.6  0.5  0.6   Alkaline Phos 38 - 126 U/L 92  109  82   AST 15 - 41 U/L 22  15  19    ALT 0 - 44 U/L 10  6  8       No results found for: CEA1, CEA / No results found for: CEA1, CEA No results found for: PSA1 No results found for: CAN199 No results found for: CAN125  No results found for: TOTALPROTELP, ALBUMINELP, A1GS, A2GS, BETS,  BETA2SER, GAMS, MSPIKE, SPEI Lab Results  Component Value Date   TIBC 429 11/14/2023   TIBC 460 (H) 08/08/2023   TIBC 348 02/12/2023   FERRITIN 32 11/14/2023   FERRITIN 12 (L) 08/08/2023   FERRITIN 153 02/12/2023   IRONPCTSAT 12 (L) 11/14/2023   IRONPCTSAT 9 (L) 08/08/2023   IRONPCTSAT 25 02/12/2023   No results found for: LDH   STUDIES:   CUP PACEART REMOTE DEVICE CHECK Result Date: 11/07/2023 PPM Scheduled remote reviewed. Normal device function.  Presenting rhythm: AS-VP, AP-VP, AP-VS. 4 AHR detections, longest 3 min 20 sec, EGMs consistent with AFL, known history, Watchman implanted per Epic. Next remote 91 days. - CS, CVRS

## 2023-11-26 NOTE — Patient Instructions (Signed)
 [facility] at [facility] Discharge Instructions   You were seen and examined today by Dr. Ellin Saba.  He reviewed the results of your lab work which are normal/stable.   We will see you back in 3 months. We will repeat lab work prior to this visit.    Return as scheduled.    Thank you for choosing [facility] at [facility] to provide your oncology and hematology care.  To afford each patient quality time with our provider, please arrive at least 15 minutes before your scheduled appointment time.   If you have a lab appointment with the Cancer Center please come in thru the Main Entrance and check in at the main information desk.  You need to re-schedule your appointment should you arrive 10 or more minutes late.  We strive to give you quality time with our providers, and arriving late affects you and other patients whose appointments are after yours.  Also, if you no show three or more times for appointments you may be dismissed from the clinic at the providers discretion.     Again, thank you for choosing [facility].  Our hope is that these requests will decrease the amount of time that you wait before being seen by our physicians.       _____________________________________________________________  Should you have questions after your visit to [facility], please contact our office at [phone] and follow the prompts.  Our office hours are 8:00 a.m. and 4:30 p.m. Monday - Friday.  Please note that voicemails left after 4:00 p.m. may not be returned until the following business day.  We are closed weekends and major holidays.  You do have access to a nurse 24-7, just call the main number to the clinic [phone] and do not press any options, hold on the line and a nurse will answer the phone.    For prescription refill requests, have your pharmacy contact our office and allow 72 hours.    Due to Covid, you  will need to wear a mask upon entering the hospital. If you do not have a mask, a mask will be given to you at the Main Entrance upon arrival. For doctor visits, patients may have 1 support person age 55 or older with them. For treatment visits, patients can not have anyone with them due to social distancing guidelines and our immunocompromised population.

## 2023-12-03 ENCOUNTER — Inpatient Hospital Stay

## 2023-12-03 VITALS — BP 115/74 | HR 60 | Temp 98.0°F | Resp 16

## 2023-12-03 DIAGNOSIS — C61 Malignant neoplasm of prostate: Secondary | ICD-10-CM

## 2023-12-03 DIAGNOSIS — Z5111 Encounter for antineoplastic chemotherapy: Secondary | ICD-10-CM | POA: Diagnosis not present

## 2023-12-03 MED ORDER — LEUPROLIDE ACETATE (6 MONTH) 45 MG ~~LOC~~ KIT
45.0000 mg | PACK | Freq: Once | SUBCUTANEOUS | Status: AC
  Administered 2023-12-03: 45 mg via SUBCUTANEOUS
  Filled 2023-12-03: qty 45

## 2023-12-03 MED ORDER — ZOLEDRONIC ACID 4 MG/100ML IV SOLN
4.0000 mg | Freq: Once | INTRAVENOUS | Status: AC
  Administered 2023-12-03: 4 mg via INTRAVENOUS
  Filled 2023-12-03: qty 100

## 2023-12-03 MED ORDER — SODIUM CHLORIDE 0.9 % IV SOLN: INTRAVENOUS | Status: DC

## 2023-12-03 MED ORDER — CETIRIZINE HCL 10 MG/ML IV SOLN
10.0000 mg | Freq: Once | INTRAVENOUS | Status: AC
  Administered 2023-12-03: 10 mg via INTRAVENOUS
  Filled 2023-12-03: qty 1

## 2023-12-03 MED ORDER — SODIUM CHLORIDE 0.9 % IV SOLN
1000.0000 mg | Freq: Once | INTRAVENOUS | Status: AC
  Administered 2023-12-03: 1000 mg via INTRAVENOUS
  Filled 2023-12-03: qty 20

## 2023-12-03 MED ORDER — FAMOTIDINE IN NACL 20-0.9 MG/50ML-% IV SOLN
20.0000 mg | Freq: Once | INTRAVENOUS | Status: AC
  Administered 2023-12-03: 20 mg via INTRAVENOUS
  Filled 2023-12-03: qty 50

## 2023-12-03 MED ORDER — ACETAMINOPHEN 325 MG PO TABS
650.0000 mg | ORAL_TABLET | Freq: Once | ORAL | Status: AC
  Administered 2023-12-03: 650 mg via ORAL
  Filled 2023-12-03: qty 2

## 2023-12-03 MED ORDER — METHYLPREDNISOLONE SODIUM SUCC 125 MG IJ SOLR
125.0000 mg | Freq: Once | INTRAMUSCULAR | Status: AC
  Administered 2023-12-03: 125 mg via INTRAVENOUS
  Filled 2023-12-03: qty 2

## 2023-12-03 NOTE — Progress Notes (Signed)
 Insurance will not cover Prolia.  Orders received to change to Zometa 4 mg q 6 months.  V.O. Dr Theadore Molt PharmD

## 2023-12-03 NOTE — Progress Notes (Signed)
 Patient tolerated Eligard injection with no complaints voiced.  Site clean and dry with no bruising or swelling noted at site.  See MAR for details.  Band aid applied.  Patient stable during and after injection.  Vss with discharge and left in satisfactory condition with no s/s of distress noted. All follow ups as scheduled.   Jamille Yoshino [employer]

## 2023-12-03 NOTE — Progress Notes (Signed)
   12/03/23 1100  Spiritual Encounters  Care provided to: Patient  Referral source Other (comment) (Chaplain rounding on floor)  Reason for visit Routine spiritual support  OnCall Visit No  Spiritual Framework  Presenting Themes Significant life change;Caregiving needs;Coping tools  Community/Connection Family  Patient Stress Factors None identified  Family Stress Factors None identified  Interventions  Spiritual Care Interventions Made Established relationship of care and support;Other (comment);Explored values/beliefs/practices/strengths;Narrative/life review;Encouragement  Intervention Outcomes  Outcomes Connection to spiritual care;Awareness around self/spiritual resourses  Spiritual Care Plan  Spiritual Care Issues Still Outstanding Chaplain will continue to follow   Reason for Visit: Chaplain making rounds on the floor visiting infusion Pts   Description of Visit: Arriving in the room I found Peter Dunlap in a recliner chair receiving treatment.  I introduced myself to him as the new chaplain for the cancer center and provided education to explain that the role of a chaplain is more than just a religious presence.  He was receptive to my presence.   I invited Peter Dunlap to share his cancer journey with me and provided space for him to share.  Peter Dunlap shared many issues such as 2 heart attacks, a brain aneurism, and knee difficulties.   Peter Dunlap considers himself a survivor and practices gratitude for each new day.  He credits close family and his wife with supporting him.   Peter Dunlap was made aware of the chaplain's presence, role, and how to reach out if needed.   Plan of Care: Peter Dunlap appears to be well supported and understands the value of coping skills and self-care.  I do not anticipate further spritual care interventions beyond a check-in in a few weeks.    Maude Roll, MDiv   Chaplain, [facility]  [email or web address] [phone]

## 2023-12-03 NOTE — Progress Notes (Signed)
 Patient presents today for Infed  and Zometa infusions.  Patient is in satisfactory condition with no new complaints voiced.  Vital signs are stable.  IV placed in L wrist.  IV flushed well with good blood return noted.  We will proceed with infusion per provider orders.    Patient tolerated infusions well with no complaints voiced.  Patient left ambulatory in stable condition.  Vital signs stable at discharge.  Follow up as scheduled.

## 2023-12-03 NOTE — Patient Instructions (Signed)
 [facility] - A DEPT OF [facility]  Discharge Instructions: Thank you for choosing [facility] to provide your oncology and hematology care.  If you have a lab appointment with the Cancer Center - please note that after April 8th, 2024, all labs will be drawn in the cancer center.  You do not have to check in or register with the main entrance as you have in the past but will complete your check-in in the cancer center.  Wear comfortable clothing and clothing appropriate for easy access to any Portacath or PICC line.   We strive to give you quality time with your provider. You may need to reschedule your appointment if you arrive late (15 or more minutes).  Arriving late affects you and other patients whose appointments are after yours.  Also, if you miss three or more appointments without notifying the office, you may be dismissed from the clinic at the provider's discretion.      For prescription refill requests, have your pharmacy contact our office and allow 72 hours for refills to be completed.    Today you received the following:  Infed /Zometa.  Iron  Dextran Injection What is this medication? IRON  DEXTRAN (EYE ern DEX tran) treats low levels of iron  in your body. Iron  is a mineral that plays an important role in making red blood cells, which carry oxygen from your lungs to the rest of your body. This medicine may be used for other purposes; ask your health care provider or pharmacist if you have questions. COMMON BRAND NAME(S): Dexferrum, INFeD  What should I tell my care team before I take this medication? They need to know if you have any of these conditions: Anemia not caused by low iron  levels Heart disease High levels of iron  in the blood Kidney disease Liver disease An unusual or allergic reaction to iron , other medications, foods, dyes, or preservatives Pregnant or trying to get pregnant Breastfeeding How should I use this  medication? This medication is injected into a vein or a muscle. It is given by your care team in a hospital or clinic setting. Talk to your care team about the use of this medication in children. While it may be prescribed for children as young as 4 months for selected conditions, precautions do apply. Overdosage: If you think you have taken too much of this medicine contact a poison control center or emergency room at once. NOTE: This medicine is only for you. Do not share this medicine with others. What if I miss a dose? Keep appointments for follow-up doses. It is important not to miss your dose. Call your care team if you are unable to keep an appointment. What may interact with this medication? Do not take this medication with any of the following: Deferoxamine Dimercaprol Other iron  products This medication may also interact with the following: Chloramphenicol Deferasirox This list may not describe all possible interactions. Give your health care provider a list of all the medicines, herbs, non-prescription drugs, or dietary supplements you use. Also tell them if you smoke, drink alcohol, or use illegal drugs. Some items may interact with your medicine. What should I watch for while using this medication? Visit your care team for regular checks on your progress. Tell your care team if your symptoms do not start to get better or if they get worse. You may need blood work while taking this medication. You may need to eat more foods that contain iron . Talk  to your care team. Foods that contain iron  include whole grains/cereals, dried fruits, beans, peas, leafy green vegetables, and organ meats (liver, kidney). Long-term use of this medication may increase your risk of some cancers. Talk to your care team about your risk of cancer. What side effects may I notice from receiving this medication? Side effects that you should report to your care team as soon as possible: Allergic  reactions--skin rash, itching, hives, swelling of the face, lips, tongue, or throat Low blood pressure--dizziness, feeling faint or lightheaded, blurry vision Shortness of breath Side effects that usually do not require medical attention (report to your care team if they continue or are bothersome): Flushing Headache Joint pain Muscle pain Nausea Pain, redness, or irritation at injection site This list may not describe all possible side effects. Call your doctor for medical advice about side effects. You may report side effects to FDA at 1-800-FDA-1088. Where should I keep my medication? This medication is given in a hospital or clinic. It will not be stored at home. NOTE: This sheet is a summary. It may not cover all possible information. If you have questions about this medicine, talk to your doctor, pharmacist, or health care provider.  2024 Elsevier/Gold Standard (2023-01-10 00:00:00)   Zoledronic Acid Injection (Cancer) What is this medication? ZOLEDRONIC ACID (ZOE le dron ik AS id) treats high calcium  levels in the blood caused by cancer. It may also be used with chemotherapy to treat weakened bones caused by cancer. It works by slowing down the release of calcium  from bones. This lowers calcium  levels in your blood. It also makes your bones stronger and less likely to break (fracture). It belongs to a group of medications called bisphosphonates. This medicine may be used for other purposes; ask your health care provider or pharmacist if you have questions. COMMON BRAND NAME(S): Zometa, Zometa Powder What should I tell my care team before I take this medication? They need to know if you have any of these conditions: Dehydration Dental disease Kidney disease Liver disease Low levels of calcium  in the blood Lung or breathing disease, such as asthma Receiving steroids, such as dexamethasone  or prednisone An unusual or allergic reaction to zoledronic acid, other medications, foods,  dyes, or preservatives Pregnant or trying to get pregnant Breast-feeding How should I use this medication? This medication is injected into a vein. It is given by your care team in a hospital or clinic setting. Talk to your care team about the use of this medication in children. Special care may be needed. Overdosage: If you think you have taken too much of this medicine contact a poison control center or emergency room at once. NOTE: This medicine is only for you. Do not share this medicine with others. What if I miss a dose? Keep appointments for follow-up doses. It is important not to miss your dose. Call your care team if you are unable to keep an appointment. What may interact with this medication? Certain antibiotics given by injection Diuretics, such as bumetanide, furosemide  NSAIDs, medications for pain and inflammation, such as ibuprofen  or naproxen Teriparatide Thalidomide This list may not describe all possible interactions. Give your health care provider a list of all the medicines, herbs, non-prescription drugs, or dietary supplements you use. Also tell them if you smoke, drink alcohol, or use illegal drugs. Some items may interact with your medicine. What should I watch for while using this medication? Visit your care team for regular checks on your progress. It may be  some time before you see the benefit from this medication. Some people who take this medication have severe bone, joint, or muscle pain. This medication may also increase your risk for jaw problems or a broken thigh bone. Tell your care team right away if you have severe pain in your jaw, bones, joints, or muscles. Tell you care team if you have any pain that does not go away or that gets worse. Tell your dentist and dental surgeon that you are taking this medication. You should not have major dental surgery while on this medication. See your dentist to have a dental exam and fix any dental problems before starting  this medication. Take good care of your teeth while on this medication. Make sure you see your dentist for regular follow-up appointments. You should make sure you get enough calcium  and vitamin D  while you are taking this medication. Discuss the foods you eat and the vitamins you take with your care team. Check with your care team if you have severe diarrhea, nausea, and vomiting, or if you sweat a lot. The loss of too much body fluid may make it dangerous for you to take this medication. You may need bloodwork while taking this medication. Talk to your care team if you wish to become pregnant or think you might be pregnant. This medication can cause serious birth defects. What side effects may I notice from receiving this medication? Side effects that you should report to your care team as soon as possible: Allergic reactions--skin rash, itching, hives, swelling of the face, lips, tongue, or throat Kidney injury--decrease in the amount of urine, swelling of the ankles, hands, or feet Low calcium  level--muscle pain or cramps, confusion, tingling, or numbness in the hands or feet Osteonecrosis of the jaw--pain, swelling, or redness in the mouth, numbness of the jaw, poor healing after dental work, unusual discharge from the mouth, visible bones in the mouth Severe bone, joint, or muscle pain Side effects that usually do not require medical attention (report to your care team if they continue or are bothersome): Constipation Fatigue Fever Loss of appetite Nausea Stomach pain This list may not describe all possible side effects. Call your doctor for medical advice about side effects. You may report side effects to FDA at 1-800-FDA-1088. Where should I keep my medication? This medication is given in a hospital or clinic. It will not be stored at home. NOTE: This sheet is a summary. It may not cover all possible information. If you have questions about this medicine, talk to your doctor,  pharmacist, or health care provider.  2024 Elsevier/Gold Standard (2021-07-15 00:00:00)    To help prevent nausea and vomiting after your treatment, we encourage you to take your nausea medication as directed.  BELOW ARE SYMPTOMS THAT SHOULD BE REPORTED IMMEDIATELY: *FEVER GREATER THAN 100.4 F (38 C) OR HIGHER *CHILLS OR SWEATING *NAUSEA AND VOMITING THAT IS NOT CONTROLLED WITH YOUR NAUSEA MEDICATION *UNUSUAL SHORTNESS OF BREATH *UNUSUAL BRUISING OR BLEEDING *URINARY PROBLEMS (pain or burning when urinating, or frequent urination) *BOWEL PROBLEMS (unusual diarrhea, constipation, pain near the anus) TENDERNESS IN MOUTH AND THROAT WITH OR WITHOUT PRESENCE OF ULCERS (sore throat, sores in mouth, or a toothache) UNUSUAL RASH, SWELLING OR PAIN  UNUSUAL VAGINAL DISCHARGE OR ITCHING   Items with * indicate a potential emergency and should be followed up as soon as possible or go to the Emergency Department if any problems should occur.  Please show the CHEMOTHERAPY ALERT CARD or IMMUNOTHERAPY ALERT CARD  at check-in to the Emergency Department and triage nurse.  Should you have questions after your visit or need to cancel or reschedule your appointment, please contact [facility] [place] - A DEPT OF [facility] [phone]  and follow the prompts.  Office hours are 8:00 a.m. to 4:30 p.m. Monday - Friday. Please note that voicemails left after 4:00 p.m. may not be returned until the following business day.  We are closed weekends and major holidays. You have access to a nurse at all times for urgent questions. Please call the main number to the clinic [phone] and follow the prompts.  For any non-urgent questions, you may also contact your provider using MyChart. We now offer e-Visits for anyone 70 and older to request care online for non-urgent symptoms. For details visit [email or web address].   Also download the MyChart app! Go to the app store, search MyChart,  open the app, select , and log in with your MyChart username and password.

## 2023-12-17 NOTE — Progress Notes (Signed)
 Impression/Assessment:  1.  Castrate sensitive metastatic prostate cancer, on leuprolide , apalutamide  with excellent PSA response that is durable.  Most recent PSA 2 weeks ago less than 0.01  2.  Osteopenia, on Prolia.  Last bone density study was over 2 years ago  3.  BPH with lower urinary tract symptoms, on silodosin , Gemtesa and Solifenacin , still doing well, emptying well. 4.  Recent gross hematuria, secondary to radiation cystitis, he underwent cystoscopy and fulguration.  Urine has been clear  5.  Meatal stenosis, voiding adequately  Plan: 1.  I will order DEXA scan  2.  As he is seeing oncology regularly, I will have him come back in 6 months for recheck   History of Present Illness: Peter Dunlap is here today for follow-up of prostate cancer and BPH w/ LUTS:    Prostate cancer:    He underwent TRUS/Bx on 8.15.2017. At that time, PSA was 9.09. Prostatic volume was 25 cc. 10/12 cores came back positive for adenocarcinoma as follows: 1 core revealed GS 3+3 5 cores revealed GS 3+4 4 cores revealed GS 4+3   He completed IMRT, 40 sessions, on 1.10.2018   8.24.2021: Repeat TRUS/Bx for elevating PSA trend following definitive Rx. 1/12 cores-from the left mid lateral region-came back positive for adenocarcinoma.  Grading was difficult due to his prior radiation.     10.15.2021: -CT A/P  with newly enlarged left iliac lymph nodes measuring 1.1 x 0.9 cm.  Hepatic steatosis with early signs of cirrhosis.   04/12/2020 -F-18 PSMA PET scan on showed radiotracer activity associated with enlarged left external iliac lymph node 9 mm with SUV 7.6.  Small left common iliac lymph node 6 mm, SUV 8.6.  Left periaortic lymph node 5 mm, SUV 6.3.  Lymph node between IVC and aorta at the level of right renal hilum, SUV 8.6.  Lymph node deep to the IVC measuring 6 mm, SUV 5.4.  No skeletal metastasis.   11.17.2021: Began ADT with Firmagon  and Erleada .   7.5.2022: Continues Firmagon  and Erleada   Most  recent PSA is less than 0.1.  Testosterone  level 5.   11.9.2022: PSA < 0.01. T level castrate.  Continues Firmagon  and Erleada    7.18.2023: PSA is 0.01, testosterone  level still castrate.  Continues on Erleada  and Firmagon .    11.21.2023: Most recent PSA in September less than 0.01, nadir level for him.  He did experience hematuria in September-passed a clot,   5.21.2024: PSA 2 months ago was still undetectable.  Still with urinary frequency, urgency and urgency incontinence as well as nocturia.  On Rapaflo  twice a day as well as Solifenacin  10 mg.   8.7.2024:  He was switched to leuprolide  45 mg from his monthly Firmagon  on the 18th of last month.  He is much more comfortable with this.  Most recent PSA was undetectable.  At his last visit he was started on Gemtesa in addition to his Solifenacin .    11.11.2024: He follows regularly with Dr. Rogers.  He is on leuprolide  45 mg every 6 months, last given in July.  Also on apalutamide  and Prolia.  Most recent PSA 2 months ago is less than 0.1.  He is having no hot flashes.  He takes vitamin D  supplements.  He does get some exercise-walking on a regular basis.  No bony pain.     BPH   11.12.2024: He denies significant lower urinary tract symptoms.  He is having no blood in his urine.  He is on Solifenacin  and alfuzosin  Gross hematuria:  He underwent cystoscopy, bladder biopsy and fulguration of bleeders on 06/21/2023.  Bladder biopsies revealed benign findings.  He has not noticed any blood in his urine since the procedure.  He is having spraying with urination.  He is having worsening urgency incontinence.      7.15.2025: He is voiding well.  He has no gross hematuria.  Spraying of stream is still present but less than several months ago.  He feels like he is emptying adequately.  He has not experienced gross hematuria.   Past Surgical History:  Procedure Laterality Date   BRAIN SURGERY  2015   hematoma evacuation   BURR HOLE  Right 04/13/2014   Procedure: SOLMON HERRLICH;  Surgeon: Victory DELENA Gunnels, MD;  Location: [facility];  Service: Neurosurgery;  Laterality: Right;   CAROTID ENDARTERECTOMY Right Feb. 25, 2010    CEA   COLONOSCOPY WITH PROPOFOL  N/A 06/24/2021   Procedure: COLONOSCOPY WITH PROPOFOL ;  Surgeon: Rollin Dover, MD;  Location: [facility];  Service: Endoscopy;  Laterality: N/A;   CORONARY ANGIOPLASTY WITH STENT PLACEMENT  12/03/2013   LAD 90%-->0% W/ Promus Premier DES 3.0 mm x 16 mm, CFX OK, RCA 40%, EF 70-75%   CYSTOSCOPY WITH FULGERATION N/A 06/21/2023   Procedure: CYSTOSCOPY WITH FULGERATION and clot evacuation;  Surgeon: Matilda Senior, MD;  Location: [facility];  Service: Urology;  Laterality: N/A;   LEFT ATRIAL APPENDAGE OCCLUSION N/A 08/05/2015   Procedure: LEFT ATRIAL APPENDAGE OCCLUSION;  Surgeon: Lynwood Rakers, MD;  Location: [facility];  Service: Cardiovascular;  Laterality: N/A;   LEFT HEART CATH AND CORONARY ANGIOGRAPHY N/A 09/13/2021   Procedure: LEFT HEART CATH AND CORONARY ANGIOGRAPHY;  Surgeon: Wendel Lurena POUR, MD;  Location: [facility];  Service: Cardiovascular;  Laterality: N/A;   LEFT HEART CATHETERIZATION WITH CORONARY ANGIOGRAM Left 12/03/2013   Procedure: LEFT HEART CATHETERIZATION WITH CORONARY ANGIOGRAM;  Surgeon: Alm LELON Clay, MD;  Location[facility] CATH LAB;  Service: Cardiovascular;  Laterality: Left;   LEFT HEART CATHETERIZATION WITH CORONARY ANGIOGRAM N/A 01/26/2014   Procedure: LEFT HEART CATHETERIZATION WITH CORONARY ANGIOGRAM;  Surgeon: Candyce GORMAN Reek, MD;  Location: [facility] CATH LAB;  Service: Cardiovascular;  Laterality: N/A;   LEFT HEART CATHETERIZATION WITH CORONARY ANGIOGRAM N/A 08/03/2014   Procedure: LEFT HEART CATHETERIZATION WITH CORONARY ANGIOGRAM;  Surgeon: Lonni JONETTA Cash, MD;  Location: [facility] CATH LAB;  Service: Cardiovascular;  Laterality: N/A;   PERCUTANEOUS CORONARY STENT INTERVENTION (PCI-S)  12/03/2013   Procedure: PERCUTANEOUS  CORONARY STENT INTERVENTION (PCI-S);  Surgeon: Alm LELON Clay, MD;  Location: [facility] CATH LAB;  Service: Cardiovascular;;   PERMANENT PACEMAKER INSERTION N/A 09/18/2014   Procedure: PERMANENT PACEMAKER INSERTION;  Surgeon: Danelle LELON Birmingham, MD;  Location: [facility] CATH LAB;  Service: Cardiovascular;  Laterality: N/A;   POLYPECTOMY  06/24/2021   Procedure: POLYPECTOMY;  Surgeon: Rollin Dover, MD;  Location: [facility]OS[facility];  Service: Endoscopy;;   RADIOFREQUENCY ABLATION  2005   for PSVT   TEE WITHOUT CARDIOVERSION N/A 07/27/2015   Procedure: TRANSESOPHAGEAL ECHOCARDIOGRAM (TEE);  Surgeon: Redell GORMAN Shallow, MD;  Location: [facility] ENDOSCOPY;  Service: Cardiovascular;  Laterality: N/A;   TEE WITHOUT CARDIOVERSION N/A 09/15/2015   Procedure: TRANSESOPHAGEAL ECHOCARDIOGRAM (TEE);  Surgeon: Aleene JINNY Passe, MD;  Location: [facility] ENDOSCOPY;  Service: Cardiovascular;  Laterality: N/A;    Home Medications:  Allergies as of 12/18/2023       Reactions   Lactose Intolerance (gi) Other (See Comments)   UPSET STOMACH   Trazodone   And Nefazodone    Nightmares        Medication List        Accurate as of December 17, 2023  9:38 AM. If you have any questions, ask your nurse or doctor.          acetaminophen  500 MG tablet Commonly known as: TYLENOL  Take 500-1,000 mg by mouth every 6 (six) hours as needed for moderate pain (pain score 4-6).   allopurinol  100 MG tablet Commonly known as: ZYLOPRIM  TAKE 1 TABLET(100 MG) BY MOUTH DAILY   Erleada  60 MG tablet Generic drug: apalutamide  TAKE 4 TABLETS (240 MG TOTAL) BY MOUTH DAILY. MAY BE TAKEN WITH OR WITHOUT FOOD. SWALLOW TABLETS WHOLE.   Farxiga  5 MG Tabs tablet Generic drug: dapagliflozin  propanediol Take 1 tablet (5 mg total) by mouth daily.   finasteride  5 MG tablet Commonly known as: PROSCAR  TAKE 1 TABLET(5 MG) BY MOUTH DAILY   fluticasone  50 MCG/ACT nasal spray Commonly known as: FLONASE  Place 2 sprays into both nostrils daily. What changed:  when to take  this reasons to take this   Gemtesa 75 MG Tabs Generic drug: Vibegron Take 75 mg by mouth every Monday, Wednesday, and Friday.   isosorbide  mononitrate 30 MG 24 hr tablet Commonly known as: IMDUR  TAKE 1 TABLET(30 MG) BY MOUTH DAILY   levothyroxine  25 MCG tablet Commonly known as: SYNTHROID  Take 1 tablet (25 mcg total) by mouth See admin instructions. Takes along with 200 mcg to total 225 mcg daily   levothyroxine  200 MCG tablet Commonly known as: SYNTHROID  Take 1 tablet (200 mcg total) by mouth daily before breakfast.   Linzess 145 MCG Caps capsule Generic drug: linaclotide Take 145 mcg by mouth every morning.   loratadine  10 MG tablet Commonly known as: CLARITIN  TAKE 1 TABLET(10 MG) BY MOUTH DAILY AS NEEDED FOR ALLERGIES   naloxone 4 MG/0.1ML Liqd nasal spray kit Commonly known as: NARCAN Place 4 mg into the nose once as needed (accidental overdose).   nitroGLYCERIN  0.4 MG SL tablet Commonly known as: NITROSTAT  PLACE 1 TABLET UNDER THE TONGUE EVERY 5 MINUTES AS NEEDED FOR CHEST AIN CALL 911 AT THIRD DOSE IN 15 MINUTES   OneTouch Verio test strip Generic drug: glucose blood daily.   oxyCODONE -acetaminophen  10-325 MG tablet Commonly known as: PERCOCET Take 1 tablet by mouth every 8 (eight) hours as needed for pain.   pantoprazole  40 MG tablet Commonly known as: PROTONIX  TAKE 1 TABLET(40 MG) BY MOUTH DAILY   phenazopyridine  100 MG tablet Commonly known as: PYRIDIUM  Take 1 tablet (100 mg total) by mouth 3 (three) times daily with meals.   rosuvastatin  40 MG tablet Commonly known as: CRESTOR  Take 40 mg by mouth every evening.   silodosin  8 MG Caps capsule Commonly known as: RAPAFLO  TAKE 1 CAPSULE BY MOUTH TWICE DAILY   solifenacin  10 MG tablet Commonly known as: VESICARE  TAKE 1 TABLET(10 MG) BY MOUTH DAILY   sotalol  80 MG tablet Commonly known as: BETAPACE  TAKE 1 TABLET(80 MG) BY MOUTH TWICE DAILY   Vitamin B 12 500 MCG Tabs Take 500 mcg by mouth in the  morning.   Vitamin D  (Ergocalciferol ) 1.25 MG (50000 UNIT) Caps capsule Commonly known as: DRISDOL  TAKE 1 CAPSULE BY MOUTH EVERY 7 DAYS What changed: when to take this        Allergies:  Allergies  Allergen Reactions   Lactose Intolerance (Gi) Other (See Comments)    UPSET STOMACH    Trazodone  And Nefazodone  Nightmares    Family History  Problem Relation Age of Onset   Hypertension Mother        Cerebrovascular disease   Diabetes Mother    Coronary artery disease Father 69   Diabetes type II Father    Hypertension Father    Heart attack Father    Diabetes Brother    Hypertension Brother    Diabetes Sister    Hypertension Sister    Heart attack Sister 9   Leukemia Sister 49       CML   Breast cancer Maternal Grandmother        dx 58s   Lung cancer Maternal Uncle        dx >50, smoker   Cancer Cousin        mouth cancer, dx 93s, no smoking/chew tobacco hx (maternal 1st cousin)   Ovarian cancer Cousin        dx <50 (maternal 1st cousin)   Stomach cancer Cousin        dx 46s (maternal 1st cousin)   Cancer Cousin        type unknown to pt, dx >50 (paternal 1st cousin)    Social History:  reports that he quit smoking about 11 years ago. His smoking use included cigarettes. He started smoking about 51 years ago. He has a 40 pack-year smoking history. He has never used smokeless tobacco. He reports that he does not currently use alcohol. He reports that he does not currently use drugs after having used the following drugs: Cocaine.  ROS: A complete review of systems was performed.  All systems are negative except for pertinent findings as noted.  Physical Exam:  Vital signs in last 24 hours: There were no vitals taken for this visit. Constitutional:  Alert and oriented, No acute distress Cardiovascular: Regular rate  Respiratory: Normal respiratory effort GU: Meatal stenosis noted Neurologic: Grossly intact, no focal deficits Psychiatric: Normal mood and  affect   Prior GU notes reviewed  I have also reviewed notes from referring/previous physicians--Dr. Charmain last note reviewed  I have independently reviewed prior imaging--bone density study from June 2023 revealed osteopenia  Urinalysis today clear.  IPSS 20/3

## 2023-12-18 ENCOUNTER — Ambulatory Visit (INDEPENDENT_AMBULATORY_CARE_PROVIDER_SITE_OTHER): Admitting: Urology

## 2023-12-18 VITALS — BP 100/65 | HR 67

## 2023-12-18 DIAGNOSIS — Z79899 Other long term (current) drug therapy: Secondary | ICD-10-CM | POA: Diagnosis not present

## 2023-12-18 DIAGNOSIS — R351 Nocturia: Secondary | ICD-10-CM

## 2023-12-18 DIAGNOSIS — C61 Malignant neoplasm of prostate: Secondary | ICD-10-CM

## 2023-12-18 DIAGNOSIS — M818 Other osteoporosis without current pathological fracture: Secondary | ICD-10-CM

## 2023-12-18 DIAGNOSIS — N3040 Irradiation cystitis without hematuria: Secondary | ICD-10-CM

## 2023-12-18 DIAGNOSIS — R350 Frequency of micturition: Secondary | ICD-10-CM

## 2023-12-18 DIAGNOSIS — Z191 Hormone sensitive malignancy status: Secondary | ICD-10-CM | POA: Diagnosis not present

## 2023-12-18 DIAGNOSIS — N3041 Irradiation cystitis with hematuria: Secondary | ICD-10-CM | POA: Diagnosis not present

## 2023-12-18 DIAGNOSIS — R310 Gross hematuria: Secondary | ICD-10-CM

## 2023-12-18 LAB — URINALYSIS, ROUTINE W REFLEX MICROSCOPIC
Bilirubin, UA: NEGATIVE
Ketones, UA: NEGATIVE
Leukocytes,UA: NEGATIVE
Nitrite, UA: NEGATIVE
Specific Gravity, UA: 1.015 (ref 1.005–1.030)
Urobilinogen, Ur: 1 mg/dL (ref 0.2–1.0)
pH, UA: 6 (ref 5.0–7.5)

## 2023-12-18 LAB — MICROSCOPIC EXAMINATION

## 2023-12-18 LAB — BLADDER SCAN AMB NON-IMAGING: Scan Result: 11

## 2023-12-18 NOTE — Progress Notes (Signed)
 Bladder Scan completed today.  Patient can void prior to the bladder scan. Bladder scan result: 11  Performed By: Alfonse Spruce. CMA  Additional notes-

## 2023-12-20 ENCOUNTER — Other Ambulatory Visit: Payer: Self-pay

## 2023-12-20 NOTE — Progress Notes (Signed)
 Specialty Pharmacy Refill Coordination Note  Peter Dunlap is a 68 y.o. male contacted today regarding refills of specialty medication(s) Apalutamide  (ERLEADA )   Patient requested Delivery   Delivery date: 12/26/23   Verified address: [address]  [place],  [place] [address]   Medication will be filled on 12/25/23.

## 2023-12-24 ENCOUNTER — Inpatient Hospital Stay: Attending: Hematology

## 2023-12-24 VITALS — BP 132/65 | HR 69 | Temp 98.4°F | Resp 18

## 2023-12-24 DIAGNOSIS — Z191 Hormone sensitive malignancy status: Secondary | ICD-10-CM

## 2023-12-24 MED ORDER — LEUPROLIDE ACETATE (6 MONTH) 45 MG ~~LOC~~ KIT: 45.0000 mg | PACK | Freq: Once | SUBCUTANEOUS | Status: DC

## 2023-12-24 NOTE — Progress Notes (Signed)
 Patient scheduled for Eligard . Last injection 12-03-2023. No injection given.

## 2023-12-25 ENCOUNTER — Encounter: Payer: Self-pay | Admitting: Nurse Practitioner

## 2023-12-25 ENCOUNTER — Ambulatory Visit (INDEPENDENT_AMBULATORY_CARE_PROVIDER_SITE_OTHER): Admitting: Nurse Practitioner

## 2023-12-25 VITALS — BP 100/68 | HR 72 | Ht 72.0 in | Wt 289.8 lb

## 2023-12-25 DIAGNOSIS — I10 Essential (primary) hypertension: Secondary | ICD-10-CM | POA: Diagnosis not present

## 2023-12-25 DIAGNOSIS — E782 Mixed hyperlipidemia: Secondary | ICD-10-CM

## 2023-12-25 DIAGNOSIS — E559 Vitamin D deficiency, unspecified: Secondary | ICD-10-CM | POA: Diagnosis not present

## 2023-12-25 DIAGNOSIS — E1169 Type 2 diabetes mellitus with other specified complication: Secondary | ICD-10-CM | POA: Diagnosis not present

## 2023-12-25 DIAGNOSIS — E669 Obesity, unspecified: Secondary | ICD-10-CM | POA: Diagnosis not present

## 2023-12-25 DIAGNOSIS — E890 Postprocedural hypothyroidism: Secondary | ICD-10-CM

## 2023-12-25 DIAGNOSIS — Z7984 Long term (current) use of oral hypoglycemic drugs: Secondary | ICD-10-CM

## 2023-12-25 NOTE — Progress Notes (Signed)
 12/25/2023                                                 Endocrinology Follow Up  Subjective:    Patient ID: Peter Dunlap, male    DOB: 1955-11-04, PCP Shelda Atlas, MD  Patient presents today for follow-up of hypothyroidism following radioactive iodine treatment, and type 2 diabetes.  Past Medical History:  Diagnosis Date   Ambulates with cane    Anemia    history of iron  infusions in 2024   Arteriosclerotic cardiovascular disease (ASCVD) 2005   catheterization in 10/2010:50% mid LAD, diffuse distal disease, circumflex irregularities, large dominant RCA with a 50% ostial, 70% distal, 60% posterolateral and 70% PDA; normal EF   Arthritis    Benign prostatic hypertrophy    Bilateral carpal tunnel syndrome 07/03/2018   Cerebrovascular disease 2010   R. carotid endarterectomy; Duplex in 10/2010-widely patent ICAs, subtotal left vertebral-not thought to be contributing to symptoms   Cervical spine disease    CT in 2012-advanced degeneration and spondylosis with moderate spinal stenosis at C3-C6   CHF (congestive heart failure) [facility])    Follows w/ cardiologist, Dr. Danelle Birmingham.   Depression    Diabetes mellitus without complication (HCC)    Takes Farxiga .   Erectile dysfunction    Family history of breast cancer    Family history of cancer of mouth    Family history of CML (chronic monocytic leukemia)    Family history of lung cancer    Family history of ovarian cancer    Family history of stomach cancer    Full dentures    Wife states that he usually does not wear his dentures.   Gastroesophageal reflux disease    H/O hiatal hernia    H/O: substance abuse (HCC)    Cocaine, marijuana, alcohol.  Quit 2013.    Headache    Hyperlipidemia    Hypertension    Follows w/ cardiologist, Dr. Danelle Birmingham.   Hypothyroidism    Follows w/ Benton Rio, NP, endocrinology.   Non-ST elevation myocardial infarction (NSTEMI), initial episode of care [facility]) 12/02/2013   DES LAD    Obesity    Presence of permanent cardiac pacemaker    Prostate cancer (HCC)    Sleep apnea    CPAP   Tachy-brady syndrome (HCC)    a. 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 25, 2010    CEA   COLONOSCOPY WITH PROPOFOL  N/A 06/24/2021   Procedure: COLONOSCOPY WITH PROPOFOL ;  Surgeon: Rollin Dover, MD;  Location: [facility] ENDOSCOPY;  Service: Endoscopy;  Laterality: N/A;   CORONARY ANGIOPLASTY WITH STENT PLACEMENT  12/03/2013   LAD 90%-->0% W/ Promus Premier DES 3.0 mm x 16 mm, CFX OK, RCA 40%, EF 70-75%   CYSTOSCOPY WITH FULGERATION N/A 06/21/2023   Procedure: CYSTOSCOPY WITH FULGERATION and clot evacuation;  Surgeon:  Matilda Senior, MD;  Location: [facility];  Service: Urology;  Laterality: N/A;   LEFT ATRIAL APPENDAGE OCCLUSION N/A 08/05/2015   Procedure: LEFT ATRIAL APPENDAGE OCCLUSION;  Surgeon: Lynwood Rakers, MD;  Location: [facility] INVASIVE CV LAB;  Service: Cardiovascular;  Laterality: N/A;   LEFT HEART CATH AND CORONARY ANGIOGRAPHY N/A 09/13/2021   Procedure: LEFT HEART CATH AND CORONARY ANGIOGRAPHY;  Surgeon: Wendel Lurena POUR, MD;  Location: [facility] INVASIVE CV LAB;  Service: Cardiovascular;  Laterality: N/A;   LEFT HEART CATHETERIZATION WITH CORONARY ANGIOGRAM Left 12/03/2013   Procedure: LEFT HEART CATHETERIZATION WITH CORONARY ANGIOGRAM;  Surgeon: Alm LELON Clay, MD;  Location: [facility] CATH LAB;  Service: Cardiovascular;  Laterality: Left;   LEFT HEART CATHETERIZATION WITH CORONARY ANGIOGRAM N/A 01/26/2014   Procedure: LEFT HEART CATHETERIZATION WITH CORONARY ANGIOGRAM;   Surgeon: Candyce GORMAN Reek, MD;  Location: [facility] CATH LAB;  Service: Cardiovascular;  Laterality: N/A;   LEFT HEART CATHETERIZATION WITH CORONARY ANGIOGRAM N/A 08/03/2014   Procedure: LEFT HEART CATHETERIZATION WITH CORONARY ANGIOGRAM;  Surgeon: Lonni JONETTA Cash, MD;  Location: [facility] CATH LAB;  Service: Cardiovascular;  Laterality: N/A;   PERCUTANEOUS CORONARY STENT INTERVENTION (PCI-S)  12/03/2013   Procedure: PERCUTANEOUS CORONARY STENT INTERVENTION (PCI-S);  Surgeon: Alm LELON Clay, MD;  Location: [facility] CATH LAB;  Service: Cardiovascular;;   PERMANENT PACEMAKER INSERTION N/A 09/18/2014   Procedure: PERMANENT PACEMAKER INSERTION;  Surgeon: Danelle LELON Birmingham, MD;  Location: [facility] CATH LAB;  Service: Cardiovascular;  Laterality: N/A;   POLYPECTOMY  06/24/2021   Procedure: POLYPECTOMY;  Surgeon: Rollin Dover, MD;  Location: [facility] ENDOSCOPY;  Service: Endoscopy;;   RADIOFREQUENCY ABLATION  2005   for PSVT   TEE WITHOUT CARDIOVERSION N/A 07/27/2015   Procedure: TRANSESOPHAGEAL ECHOCARDIOGRAM (TEE);  Surgeon: Redell GORMAN Shallow, MD;  Location: [facility] ENDOSCOPY;  Service: Cardiovascular;  Laterality: N/A;   TEE WITHOUT CARDIOVERSION N/A 09/15/2015   Procedure: TRANSESOPHAGEAL ECHOCARDIOGRAM (TEE);  Surgeon: Aleene JINNY Passe, MD;  Location: [facility] ENDOSCOPY;  Service: Cardiovascular;  Laterality: N/A;   Social History   Socioeconomic History   Marital status: Married    Spouse name: Not on file   Number of children: 0   Years of education: Not on file   Highest education level: Not on file  Occupational History   Occupation: Retired  Tobacco Use   Smoking status: Former    Current packs/day: 0.00    Average packs/day: 1 pack/day for 40.0 years (40.0 ttl pk-yrs)    Types: Cigarettes    Start date: 10/20/1972    Quit date: 10/10/2012    Years since quitting: 11.2   Smokeless tobacco: Never   Tobacco comments:    Quit in May.   Vaping Use   Vaping status: Never Used  Substance and Sexual Activity   Alcohol use:  Not Currently    Comment: former drinker-- sober since 2013.    Drug use: Not Currently    Types: Cocaine    Comment: quit cocaine 10/2011   Sexual activity: Yes    Partners: Female  Other Topics Concern   Not on file  Social History Narrative   Lives in [place].   Social Drivers of Corporate investment banker Strain: Low Risk  (04/19/2020)   Overall Financial Resource Strain (CARDIA)    Difficulty of Paying Living Expenses: Not hard at all  Food Insecurity: No Food Insecurity (06/08/2023)   Hunger Vital Sign    Worried About Running Out of Food in the Last Year: Never true  Ran Out of Food in the Last Year: Never true  Transportation Needs: No Transportation Needs (06/08/2023)   PRAPARE - Administrator, Civil Service (Medical): No    Lack of Transportation (Non-Medical): No  Physical Activity: Inactive (04/19/2020)   Exercise Vital Sign    Days of Exercise per Week: 0 days    Minutes of Exercise per Session: 0 min  Stress: No Stress Concern Present (04/19/2020)   [employer] of Occupational Health - Occupational Stress Questionnaire    Feeling of Stress : Not at all  Social Connections: Socially Integrated (06/08/2023)   Social Connection and Isolation Panel    Frequency of Communication with Friends and Family: More than three times a week    Frequency of Social Gatherings with Friends and Family: Three times a week    Attends Religious Services: More than 4 times per year    Active Member of Clubs or Organizations: Yes    Attends Banker Meetings: More than 4 times per year    Marital Status: Married   Outpatient Encounter Medications as of 12/25/2023  Medication Sig   acetaminophen  (TYLENOL ) 500 MG tablet Take 500-1,000 mg by mouth every 6 (six) hours as needed for moderate pain (pain score 4-6).   allopurinol  (ZYLOPRIM ) 100 MG tablet TAKE 1 TABLET(100 MG) BY MOUTH DAILY   apalutamide  (ERLEADA ) 60 MG tablet TAKE 4 TABLETS (240 MG TOTAL) BY  MOUTH DAILY. MAY BE TAKEN WITH OR WITHOUT FOOD. SWALLOW TABLETS WHOLE.   Cyanocobalamin (VITAMIN B 12) 500 MCG TABS Take 500 mcg by mouth in the morning.   FARXIGA  5 MG TABS tablet Take 1 tablet (5 mg total) by mouth daily.   finasteride  (PROSCAR ) 5 MG tablet TAKE 1 TABLET(5 MG) BY MOUTH DAILY   fluticasone  (FLONASE ) 50 MCG/ACT nasal spray Place 2 sprays into both nostrils daily.   isosorbide  mononitrate (IMDUR ) 30 MG 24 hr tablet TAKE 1 TABLET(30 MG) BY MOUTH DAILY   levothyroxine  (SYNTHROID ) 200 MCG tablet Take 1 tablet (200 mcg total) by mouth daily before breakfast.   levothyroxine  (SYNTHROID ) 25 MCG tablet Take 1 tablet (25 mcg total) by mouth See admin instructions. Takes along with 200 mcg to total 225 mcg daily   LINZESS 145 MCG CAPS capsule Take 145 mcg by mouth every morning.   loratadine  (CLARITIN ) 10 MG tablet TAKE 1 TABLET(10 MG) BY MOUTH DAILY AS NEEDED FOR ALLERGIES   naloxone (NARCAN) nasal spray 4 mg/0.1 mL Place 4 mg into the nose once as needed (accidental overdose).   nitroGLYCERIN  (NITROSTAT ) 0.4 MG SL tablet PLACE 1 TABLET UNDER THE TONGUE EVERY 5 MINUTES AS NEEDED FOR CHEST AIN CALL 911 AT THIRD DOSE IN 15 MINUTES   ONETOUCH VERIO test strip daily.   oxyCODONE -acetaminophen  (PERCOCET) 10-325 MG tablet Take 1 tablet by mouth every 8 (eight) hours as needed for pain.   pantoprazole  (PROTONIX ) 40 MG tablet TAKE 1 TABLET(40 MG) BY MOUTH DAILY   phenazopyridine  (PYRIDIUM ) 100 MG tablet Take 1 tablet (100 mg total) by mouth 3 (three) times daily with meals.   rosuvastatin  (CRESTOR ) 40 MG tablet Take 40 mg by mouth every evening.   silodosin  (RAPAFLO ) 8 MG CAPS capsule TAKE 1 CAPSULE BY MOUTH TWICE DAILY   solifenacin  (VESICARE ) 10 MG tablet TAKE 1 TABLET(10 MG) BY MOUTH DAILY   sotalol  (BETAPACE ) 80 MG tablet TAKE 1 TABLET(80 MG) BY MOUTH TWICE DAILY   Vibegron (GEMTESA) 75 MG TABS Take 75 mg by mouth every Monday, Wednesday,  and Friday.   Vitamin D , Ergocalciferol , (DRISDOL )  1.25 MG (50000 UNIT) CAPS capsule TAKE 1 CAPSULE BY MOUTH EVERY 7 DAYS   No facility-administered encounter medications on file as of 12/25/2023.   ALLERGIES: Allergies  Allergen Reactions   Lactose Intolerance (Gi) Other (See Comments)    UPSET STOMACH    Trazodone  And Nefazodone     Nightmares   VACCINATION STATUS: Immunization History  Administered Date(s) Administered   Fluad Quad(high Dose 65+) 03/24/2021   Influenza,inj,Quad PF,6+ Mos 02/17/2014, 03/09/2015, 03/10/2016, 03/27/2017, 03/20/2018, 03/04/2019, 03/15/2020   Moderna Sars-Covid-2 Vaccination 08/03/2019, 09/06/2019   Pneumococcal Polysaccharide-23 01/05/2014   Tdap 12/19/2010   Zoster Recombinant(Shingrix ) 12/17/2019, 02/15/2020    Diabetes He presents for his follow-up diabetic visit. He has type 2 diabetes mellitus. His disease course has been stable. There are no hypoglycemic associated symptoms. Pertinent negatives for hypoglycemia include no headaches, nervousness/anxiousness or tremors. Associated symptoms include fatigue. Pertinent negatives for diabetes include no chest pain, no foot paresthesias, no polydipsia, no polyuria and no weight loss. There are no hypoglycemic complications. Symptoms are stable. Diabetic complications include heart disease, impotence, nephropathy and peripheral neuropathy. Risk factors for coronary artery disease include hypertension, diabetes mellitus, dyslipidemia, male sex, obesity, tobacco exposure and sedentary lifestyle. Current diabetic treatment includes oral agent (monotherapy). He is compliant with treatment most of the time. His weight is increasing steadily. He is following a generally healthy diet. When asked about meal planning, he reported none. He has not had a previous visit with a dietitian. He never participates in exercise. His home blood glucose trend is fluctuating minimally. (He presents today with no meter or logs to review.  He was not asked to monitor glucose routinely  due to safe medication regimen.  His POCT A1c today is 6.7%, increasing slightly from last visit of 6.5%, but still safe goal.  He admits he has been snacking more recently but notes it is healthy food items like fruit.) An ACE inhibitor/angiotensin II receptor blocker is not being taken. He does not see a podiatrist.Eye exam is not current.  Thyroid  Problem Presents for follow-up (He was given RAI therapy on March 16, 2014. - he denies family history of thyroid  dysfunction .) visit. Symptoms include fatigue. Patient reports no anxiety, cold intolerance, constipation, diarrhea, heat intolerance, leg swelling, palpitations, tremors, weight gain or weight loss. The symptoms have been stable.    Review of systems  Constitutional: + increasing body weight,  current Body mass index is 39.3 kg/m. , no fatigue, no subjective hyperthermia, no subjective hypothermia Eyes: no blurry vision, no xerophthalmia ENT: no sore throat, no nodules palpated in throat, no dysphagia/odynophagia, no hoarseness Cardiovascular: no chest pain, no shortness of breath, no palpitations, no leg swelling Respiratory: no cough, no shortness of breath Gastrointestinal: no nausea/vomiting/diarrhea Musculoskeletal: no muscle/joint aches Skin: no rashes, no hyperemia Neurological: no tremors, no numbness, no tingling, no dizziness Psychiatric: no depression, no anxiety    Objective:    BP 100/68 (BP Location: Right Arm, Patient Position: Sitting, Cuff Size: Large)   Pulse 72   Ht 6' (1.829 m)   Wt 289 lb 12.8 oz (131.5 kg)   BMI 39.30 kg/m   Wt Readings from Last 3 Encounters:  12/25/23 289 lb 12.8 oz (131.5 kg)  11/26/23 286 lb 2.5 oz (129.8 kg)  08/22/23 284 lb 3.2 oz (128.9 kg)    BP Readings from Last 3 Encounters:  12/25/23 100/68  12/24/23 132/65  12/18/23 100/65     Physical Exam-  Limited  Constitutional:  Body mass index is 39.3 kg/m. , not in acute distress, normal state of mind Eyes:  EOMI, no  exophthalmos Musculoskeletal: no gross deformities, strength intact in all four extremities, no gross restriction of joint movements Skin:  no rashes, no hyperemia Neurological: no tremor with outstretched hands   Diabetic Foot Exam - Simple   Simple Foot Form Diabetic Foot exam was performed with the following findings: Yes 12/25/2023  8:21 AM  Visual Inspection No deformities, no ulcerations, no other skin breakdown bilaterally: Yes Sensation Testing Intact to touch and monofilament testing bilaterally: Yes Pulse Check Posterior Tibialis and Dorsalis pulse intact bilaterally: Yes Comments     Diabetic Labs (most recent): Lab Results  Component Value Date   HGBA1C 6.5 08/22/2023   HGBA1C 6.0 (H) 12/18/2022   HGBA1C 6.7 10/10/2021   MICROALBUR 150 03/09/2021   MICROALBUR 1.3 03/15/2020   MICROALBUR 4.1 11/05/2015     Lipid Panel ( most recent) Lipid Panel     Component Value Date/Time   CHOL 115 09/12/2021 0458   TRIG 69 09/12/2021 0458   HDL 39 (L) 09/12/2021 0458   CHOLHDL 2.9 09/12/2021 0458   VLDL 14 09/12/2021 0458   LDLCALC 62 09/12/2021 0458   LDLCALC 217 (H) 09/08/2020 0000    Recent Results (from the past 2160 hours)  CUP PACEART REMOTE DEVICE CHECK     Status: None   Collection Time: 11/07/23  2:00 AM  Result Value Ref Range   Date Time Interrogation Session [id number]    Pulse Generator Manufacturer [employer]    Pulse Gen Model 2240 Assurity    Pulse Gen Serial Number [id number]    Clinic Name [facility]    Implantable Pulse Generator Type Implantable Pulse Generator    Implantable Pulse Generator Implant Date 79839584    Implantable Lead Manufacturer [facility]    Implantable Lead Model 1688TC Tendril SDX    Implantable Lead Serial Number [id number]    Implantable Lead Implant Date 79839584    Implantable Lead Location Detail 1 UNKNOWN    Implantable Lead Location [id number]    Implantable Lead Connection Status [id number]    Implantable Lead Manufacturer [employer]     Implantable Lead Model 1888TC Tendril ST Optim    Implantable Lead Serial Number [id number]    Implantable Lead Implant Date 79839584    Implantable Lead Location Detail 1 UNKNOWN    Implantable Lead Location [id number]    Implantable Lead Connection Status [id number]    Lead Channel Setting Sensing Sensitivity 2.0 mV   Lead Channel Setting Sensing Adaptation Mode Fixed Pacing    Lead Channel Setting Pacing Amplitude 2.0 V   Lead Channel Setting Pacing Pulse Width 0.5 ms   Lead Channel Setting Pacing Amplitude 2.5 V   Lead Channel Status NULL    Lead Channel Impedance Value 410 ohm   Lead Channel Sensing Intrinsic Amplitude 3.2 mV   Lead Channel Pacing Threshold Amplitude 0.75 V   Lead Channel Pacing Threshold Pulse Width 0.5 ms   Lead Channel Status NULL    Lead Channel Impedance Value 600 ohm   Lead Channel Sensing Intrinsic Amplitude 12.0 mV   Lead Channel Pacing Threshold Amplitude 0.75 V   Lead Channel Pacing Threshold Pulse Width 0.5 ms   Battery Status MOS    Battery Remaining Longevity 27 mo   Battery Remaining Percentage 22.0 %   Battery Voltage 2.9 V   Brady Statistic RA Percent Paced 33.0 %   Kellogg RV  Percent Paced 6.2 %   Brady Statistic AP VP Percent 5.0 %   Brady Statistic AS VP Percent 1.2 %   Brady Statistic AP VS Percent 31.0 %   Brady Statistic AS VS Percent 62.0 %  Ferritin     Status: None   Collection Time: 11/14/23  2:14 PM  Result Value Ref Range   Ferritin 32 24 - 336 ng/mL    Comment: Performed at [facility], [address] [place], [place] [address]  Iron  and TIBC ([facility] ONLY)     Status: Abnormal   Collection Time: 11/14/23  2:14 PM  Result Value Ref Range   Iron  50 45 - 182 ug/dL   TIBC 570 749 - 549 ug/dL   Saturation Ratios 12 (L) 17.9 - 39.5 %   UIBC 379 ug/dL    Comment: Performed at [facility], [address], [place], [place] [address]  PSA     Status: None   Collection Time: 11/14/23  2:14 PM  Result Value  Ref Range   Prostatic Specific Antigen <0.01 0.00 - 4.00 ng/mL    Comment: (NOTE) While PSA levels of <=4.00 ng/ml are reported as reference range, some men with levels below 4.00 ng/ml can have prostate cancer and many men with PSA above 4.00 ng/ml do not have prostate cancer.  Other tests such as free PSA, age specific reference ranges, PSA velocity and PSA doubling time may be helpful especially in men less than 98 years old. Performed at [facility], [address] [place], [place] [address]   Comprehensive metabolic panel     Status: Abnormal   Collection Time: 11/14/23  2:14 PM  Result Value Ref Range   Sodium 139 135 - 145 mmol/L   Potassium 4.1 3.5 - 5.1 mmol/L   Chloride 109 98 - 111 mmol/L   CO2 20 (L) 22 - 32 mmol/L   Glucose, Bld 131 (H) 70 - 99 mg/dL    Comment: Glucose reference range applies only to samples taken after fasting for at least 8 hours.   BUN 12 8 - 23 mg/dL   Creatinine, Ser 8.77 0.61 - 1.24 mg/dL   Calcium  9.7 8.9 - 10.3 mg/dL   Total Protein 7.6 6.5 - 8.1 g/dL   Albumin 3.7 3.5 - 5.0 g/dL   AST 22 15 - 41 U/L   ALT 10 0 - 44 U/L   Alkaline Phosphatase 92 38 - 126 U/L   Total Bilirubin 0.6 0.0 - 1.2 mg/dL   GFR, Estimated >39 >39 mL/min    Comment: (NOTE) Calculated using the CKD-EPI Creatinine Equation (2021)    Anion gap 10 5 - 15    Comment: Performed at [facility], [address], [place], [place] [address]  CBC with Differential     Status: Abnormal   Collection Time: 11/14/23  2:14 PM  Result Value Ref Range   WBC 6.4 4.0 - 10.5 K/uL   RBC 4.00 (L) 4.22 - 5.81 MIL/uL   Hemoglobin 12.3 (L) 13.0 - 17.0 g/dL   HCT 61.6 (L) 60.9 - 47.9 %   MCV 95.8 80.0 - 100.0 fL   MCH 30.8 26.0 - 34.0 pg   MCHC 32.1 30.0 - 36.0 g/dL   RDW 82.2 (H) 88.4 - 84.4 %   Platelets 121 (L) 150 - 400 K/uL   nRBC 0.0 0.0 - 0.2 %   Neutrophils Relative % 60 %   Neutro Abs 3.8 1.7 - 7.7 K/uL  Lymphocytes Relative 25 %   Lymphs Abs 1.6 0.7 - 4.0 K/uL    Monocytes Relative 12 %   Monocytes Absolute 0.8 0.1 - 1.0 K/uL   Eosinophils Relative 2 %   Eosinophils Absolute 0.1 0.0 - 0.5 K/uL   Basophils Relative 1 %   Basophils Absolute 0.1 0.0 - 0.1 K/uL   Immature Granulocytes 0 %   Abs Immature Granulocytes 0.02 0.00 - 0.07 K/uL    Comment: Performed at [facility], [address], [place], [place] [address]  Urinalysis, Routine w reflex microscopic     Status: Abnormal   Collection Time: 12/18/23  9:59 AM  Result Value Ref Range   Specific Gravity, UA 1.015 1.005 - 1.030   pH, UA 6.0 5.0 - 7.5   Color, UA Yellow Yellow   Appearance Ur Clear Clear   Leukocytes,UA Negative Negative   Protein,UA 2+ (A) Negative/Trace   Glucose, UA 3+ (A) Negative   Ketones, UA Negative Negative   RBC, UA Trace (A) Negative   Bilirubin, UA Negative Negative   Urobilinogen, Ur 1.0 0.2 - 1.0 mg/dL   Nitrite, UA Negative Negative   Microscopic Examination See below:     Comment: Microscopic was indicated and was performed.  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  He is advised to continue his current dose of Levothyroxine  225 mcg po daily before breakfast (close to max weight based calculation of total dose).  Will recheck TFTs prior to next visit and adjust dose accordingly.   - We discussed about the correct intake of his thyroid  hormone, on empty stomach at fasting, with water , separated by at least 30 minutes from breakfast and other medications,  and separated by more than 4 hours from calcium , iron ,  multivitamins, acid reflux medications (PPIs). -Patient is made aware of the fact that thyroid  hormone replacement is needed for life, dose to be adjusted by periodic monitoring of thyroid  function tests.  2. Diabetes mellitus type 2 in obese [facility])  He presents today with no meter or logs to review.  He was not asked to monitor glucose routinely due to safe medication regimen.  His POCT A1c today is 6.7%, increasing slightly from last visit of 6.5%, but still safe goal.  He admits he has been snacking more recently but notes it is healthy food items like fruit.  - Nutritional counseling repeated at each appointment due to patients tendency to fall back in to old habits.  - The patient admits there is a room for improvement in their diet and drink choices. -  Suggestion is made for the patient to avoid simple carbohydrates from their diet including Cakes, Sweet Desserts / Pastries, Ice Cream, Soda (diet and regular), Sweet Tea, Candies, Chips, Cookies, Sweet Pastries, Store Bought Juices, Alcohol in Excess of 1-2 drinks a day, Artificial Sweeteners, Coffee Creamer, and Sugar-free Products. This will  help patient to have stable blood glucose profile and potentially avoid unintended weight gain.   - I encouraged the patient to switch to unprocessed or minimally processed complex starch and increased protein intake (animal or plant source), fruits, and vegetables.   - Patient is advised to stick to a routine mealtimes to eat 3 meals a day and avoid unnecessary snacks (to snack only to correct hypoglycemia).  - He is advised to continue Farxiga  5 mg po daily.    -He does not need to routinely monitor glucose due to safe medication regimen.  -He will be considered for incretin therapy on subsequent visits.  3. Essential hypertension His blood pressure is controlled to target.  He is advised to continue Lasix  60 mg po daily.  4.  Hyperlipidemia:  His most recent lipid panel from 09/12/21 shows  controlled LDL at 62.  He is advised to continue Simvastatin  40 mg po daily at bedtime.  Side effects and precautions discussed with him.  Will check lipid panel prior to next visit.  5. Vitamin D  Deficiency: His most recent vitamin D  level on 06/20/21 shows improvement to 79.22.  He has completed replenishment with ergocalciferol .  No need for additional supplementation at this time.   - I advised patient to maintain close follow up with Shelda Atlas, MD for primary care needs.     I spent  29  minutes in the care of the patient today including review of labs from CMP, Lipids, Thyroid  Function, Hematology (current and previous including abstractions from other facilities); face-to-face time discussing  his blood glucose readings/logs, discussing hypoglycemia and hyperglycemia episodes and symptoms, medications doses, his options of short and long term treatment based on the latest standards of care / guidelines;  discussion about incorporating lifestyle medicine;  and documenting the encounter. Risk reduction counseling performed per USPSTF guidelines to reduce obesity and cardiovascular risk factors.     Please refer to Patient Instructions for Blood Glucose Monitoring and Insulin /Medications Dosing Guide  in media tab for additional information. Please  also refer to  Patient Self Inventory in the Media  tab for reviewed elements of pertinent patient history.  Peter Dunlap participated in the discussions, expressed understanding, and voiced agreement with the above plans.  All questions were answered to his satisfaction. he is encouraged to contact clinic should he have any questions or concerns prior to his return visit.    Follow up plan: Return in about 4 months (around 04/26/2024) for Diabetes F/U with A1c in office, Thyroid  follow up, Previsit labs.  Benton Rio, [facility] [address] [place], [place] [address] Phone:  [phone] Fax: [phone]   12/25/2023, 8:25 AM

## 2024-01-01 NOTE — Progress Notes (Signed)
 Remote pacemaker transmission.

## 2024-01-04 ENCOUNTER — Other Ambulatory Visit: Payer: Self-pay | Admitting: Internal Medicine

## 2024-01-04 ENCOUNTER — Ambulatory Visit
Admission: EM | Admit: 2024-01-04 | Discharge: 2024-01-04 | Disposition: A | Discharge status: Home / Self Care | Attending: Nurse Practitioner | Admitting: Nurse Practitioner

## 2024-01-04 ENCOUNTER — Telehealth: Payer: Self-pay

## 2024-01-04 DIAGNOSIS — N3001 Acute cystitis with hematuria: Secondary | ICD-10-CM | POA: Diagnosis present

## 2024-01-04 DIAGNOSIS — R310 Gross hematuria: Secondary | ICD-10-CM | POA: Diagnosis present

## 2024-01-04 LAB — POCT URINE DIPSTICK
Glucose, UA: 500 mg/dL — AB
Nitrite, UA: POSITIVE — AB
Protein Ur, POC: >=300 mg/dL — AB
Spec Grav, UA: <=1.005 — AB (ref 1.010–1.025)
Urobilinogen, UA: >=8 U/dL — AB
pH, UA: 8.5 — AB (ref 5.0–8.0)

## 2024-01-04 MED ORDER — SULFAMETHOXAZOLE-TRIMETHOPRIM 800-160 MG PO TABS: 1.0000 | ORAL_TABLET | Freq: Two times a day (BID) | ORAL | 0 refills | Status: DC

## 2024-01-04 NOTE — Telephone Encounter (Signed)
 Called and lvm to let pt know we could not find a sooner appt but a message will be sent to MD Dahlstedt as a FYI

## 2024-01-04 NOTE — Telephone Encounter (Signed)
 Dahlstedt patient made an appointment for delayed and bloody urine.  Would like to be seen sooner if possible. Will go to urgent care or hospital.  Please advise.

## 2024-01-04 NOTE — ED Triage Notes (Signed)
 Pt reports to UC for hematuria and burning with urination that that began this morning.   Hx of radiation in 2018

## 2024-01-04 NOTE — Discharge Instructions (Signed)
 A urine culture has been ordered.  You will be contacted if the results of the culture show that the medication prescribed today needs to be changed. Take medication as prescribed.  Recommend holding your aspirin  for at least 5 days until your urine clears. You may take Tylenol  as needed for pain, fever, or general discomfort. Make sure you are drinking at least 8-10 8 ounce glasses of water  daily. Develop a toileting schedule that will allow you to urinate at least every 2 hours. Avoid caffeine such as tea, soda, or coffee while symptoms persist. As discussed, if your urine has not cleared by tomorrow, I would like for you to follow-up in the emergency department for further evaluation. I have sent a message to your urologist.  Please contact their office to see if there is a sooner appointment for further evaluation. Follow-up as needed.

## 2024-01-04 NOTE — ED Provider Notes (Signed)
 [facility]    CSN: [id number] Arrival date & time: 01/04/24  1316      History   Chief Complaint Chief Complaint  Patient presents with   Hematuria    HPI Peter Dunlap is a 68 y.o. male.   The history is provided by the patient.   Patient presents for complaints of hematuria and dysuria that started this morning.  Patient states he is urinated twice today and his urine has been bloody.  Patient states that he did experience urinary urgency and frequency last evening.  States that he did notice some blood-tinged urine over the past week or so.  Patient with prior history of bladder cancer with treatment via radiation.  Patient denies fever, chills, flank pain, or low back pain.  Patient states that he did have a difficult time getting his urine stream started.  States that once it started, it was only blood.  Patient states that he did reach out to his urologist, but they did not have any appointments.  Patient reports that he does take aspirin  daily.  Past Medical History:  Diagnosis Date   Ambulates with cane    Anemia    history of iron  infusions in 2024   Arteriosclerotic cardiovascular disease (ASCVD) 2005   catheterization in 10/2010:50% mid LAD, diffuse distal disease, circumflex irregularities, large dominant RCA with a 50% ostial, 70% distal, 60% posterolateral and 70% PDA; normal EF   Arthritis    Benign prostatic hypertrophy    Bilateral carpal tunnel syndrome 07/03/2018   Cerebrovascular disease 2010   R. carotid endarterectomy; Duplex in 10/2010-widely patent ICAs, subtotal left vertebral-not thought to be contributing to symptoms   Cervical spine disease    CT in 2012-advanced degeneration and spondylosis with moderate spinal stenosis at C3-C6   CHF (congestive heart failure) [facility])    Follows w/ cardiologist, Dr. Danelle Birmingham.   Depression    Diabetes mellitus without complication (HCC)    Takes Farxiga .   Erectile dysfunction    Family history  of breast cancer    Family history of cancer of mouth    Family history of CML (chronic monocytic leukemia)    Family history of lung cancer    Family history of ovarian cancer    Family history of stomach cancer    Full dentures    Wife states that he usually does not wear his dentures.   Gastroesophageal reflux disease    H/O hiatal hernia    H/O: substance abuse (HCC)    Cocaine, marijuana, alcohol.  Quit 2013.    Headache    Hyperlipidemia    Hypertension    Follows w/ cardiologist, Dr. Danelle Birmingham.   Hypothyroidism    Follows w/ Benton Rio, NP, endocrinology.   Non-ST elevation myocardial infarction (NSTEMI), initial episode of care [facility]) 12/02/2013   DES LAD   Obesity    Presence of permanent cardiac pacemaker    Prostate cancer (HCC)    Sleep apnea    CPAP   Tachy-brady syndrome (HCC)    a. s/p STJ dual chamber PPM    Thyroid  disease    Follows w/ endocrinology,   Tobacco abuse    Quit 2014   Ulnar neuropathy at elbow 07/03/2018   Bilateral   Wears glasses     Patient Active Problem List   Diagnosis Date Noted   Gross hematuria 06/08/2023   Hematuria 06/08/2023   Bladder outlet obstruction 06/08/2023   Renal insufficiency 06/08/2023   Thrombocytopenia (  HCC) 06/08/2023   History of prostate cancer 06/08/2023   GERD (gastroesophageal reflux disease) 06/08/2023   Obesity (BMI 30-39.9) 06/08/2023   Acute non-recurrent maxillary sinusitis 06/02/2020   Crackling sound in right ear 06/02/2020   Genetic testing 05/23/2020   Family history of CML (chronic monocytic leukemia)    Family history of lung cancer    Family history of breast cancer    Family history of ovarian cancer    Family history of stomach cancer    Family history of cancer of mouth    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   Bilateral carotid artery stenosis 08/12/2019   Carpal tunnel syndrome 07/03/2018   Ulnar neuropathy at elbow 07/03/2018    Essential hypertension 02/21/2018   Vitamin D  deficiency 02/21/2018   Presence of Watchman left atrial appendage closure device 04/12/2017   Chronic pain syndrome 10/17/2016   Prostate cancer (HCC) 03/10/2016   History of right-sided carotid endarterectomy 11/25/2015   Paroxysmal atrial fibrillation (HCC)    Pacemaker-St Jude 09/18/2014   Tachy-brady syndrome (HCC) 08/19/2014   Chronic insomnia 07/24/2014   OA (osteoarthritis) of knee 05/26/2014   S/P subdural hematoma evacuation-Nov 2015 04/10/2014   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HCC) 04/02/2014   (HFpEF) heart failure with preserved ejection fraction (HCC) 03/04/2014   Hypothyroidism following radioiodine therapy 02/17/2014   Gout 02/17/2014   OSA (obstructive sleep apnea) 02/17/2014   Class 3 obesity 12/04/2013   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   Hypertrophic cardiomyopathy (HCC) 09/07/2011   Hypertensive heart disease with CHF (congestive heart failure) (HCC)    CAD (coronary artery disease)    Cerebrovascular disease    Mixed hyperlipidemia    ERECTILE DYSFUNCTION, ORGANIC 08/11/2010   PSVT (paroxysmal supraventricular tachycardia) (HCC) 06/03/2008   Hereditary and idiopathic peripheral neuropathy 01/28/2008   Benign prostatic hyperplasia 05/11/2006   GERD 04/23/2006    Past Surgical History:  Procedure Laterality Date   BRAIN SURGERY  2015   hematoma evacuation   BURR HOLE Right 04/13/2014   Procedure: SOLMON HERRLICH;  Surgeon: Victory DELENA Gunnels, MD;  Location: [facility];  Service: Neurosurgery;  Laterality: Right;   CAROTID ENDARTERECTOMY Right Feb. 25, 2010    CEA   COLONOSCOPY WITH PROPOFOL  N/A 06/24/2021   Procedure: COLONOSCOPY WITH PROPOFOL ;  Surgeon: Rollin Dover, MD;  Location: [facility] ENDOSCOPY;  Service: Endoscopy;  Laterality: N/A;   CORONARY ANGIOPLASTY WITH STENT PLACEMENT  12/03/2013   LAD 90%-->0% W/ Promus Premier DES 3.0 mm x 16 mm, CFX OK, RCA 40%, EF  70-75%   CYSTOSCOPY WITH FULGERATION N/A 06/21/2023   Procedure: CYSTOSCOPY WITH FULGERATION and clot evacuation;  Surgeon: Matilda Senior, MD;  Location[facility];  Service: Urology;  Laterality: N/A;   LEFT ATRIAL APPENDAGE OCCLUSION N/A 08/05/2015   Procedure: LEFT ATRIAL APPENDAGE OCCLUSION;  Surgeon: Lynwood Rakers, MD;  Location: [facility];  Service: Cardiovascular;  Laterality: N/A;   LEFT HEART CATH AND CORONARY ANGIOGRAPHY N/A 09/13/2021   Procedure: LEFT HEART CATH AND CORONARY ANGIOGRAPHY;  Surgeon: Wendel Lurena POUR, MD;  Location: [facility];  Service: Cardiovascular;  Laterality: N/A;   LEFT HEART CATHETERIZATION WITH CORONARY ANGIOGRAM Left 12/03/2013   Procedure: LEFT HEART CATHETERIZATION WITH CORONARY ANGIOGRAM;  Surgeon: Alm LELON Clay, MD;  Location: [facility] CATH LAB;  Service: Cardiovascular;  Laterality: Left;   LEFT HEART CATHETERIZATION WITH CORONARY ANGIOGRAM N/A 01/26/2014   Procedure: LEFT HEART CATHETERIZATION WITH CORONARY ANGIOGRAM;  Surgeon:  Candyce GORMAN Reek, MD;  Location: [facility] CATH LAB;  Service: Cardiovascular;  Laterality: N/A;   LEFT HEART CATHETERIZATION WITH CORONARY ANGIOGRAM N/A 08/03/2014   Procedure: LEFT HEART CATHETERIZATION WITH CORONARY ANGIOGRAM;  Surgeon: Lonni JONETTA Cash, MD;  Location: [facility] CATH LAB;  Service: Cardiovascular;  Laterality: N/A;   PERCUTANEOUS CORONARY STENT INTERVENTION (PCI-S)  12/03/2013   Procedure: PERCUTANEOUS CORONARY STENT INTERVENTION (PCI-S);  Surgeon: Alm LELON Clay, MD;  Location: [facility] CATH LAB;  Service: Cardiovascular;;   PERMANENT PACEMAKER INSERTION N/A 09/18/2014   Procedure: PERMANENT PACEMAKER INSERTION;  Surgeon: Danelle LELON Birmingham, MD;  Location: [facility] CATH LAB;  Service: Cardiovascular;  Laterality: N/A;   POLYPECTOMY  06/24/2021   Procedure: POLYPECTOMY;  Surgeon: Rollin Dover, MD;  Location: [facility];  Service: Endoscopy;;   RADIOFREQUENCY ABLATION  2005   for PSVT   TEE WITHOUT  CARDIOVERSION N/A 07/27/2015   Procedure: TRANSESOPHAGEAL ECHOCARDIOGRAM (TEE);  Surgeon: Redell GORMAN Shallow, MD;  Location: [facility] ENDOSCOPY;  Service: Cardiovascular;  Laterality: N/A;   TEE WITHOUT CARDIOVERSION N/A 09/15/2015   Procedure: TRANSESOPHAGEAL ECHOCARDIOGRAM (TEE);  Surgeon: Aleene JINNY Passe, MD;  Location: [facility] ENDOSCOPY;  Service: Cardiovascular;  Laterality: N/A;       Home Medications    Prior to Admission medications   Medication Sig Start Date End Date Taking? Authorizing Provider  sulfamethoxazole -trimethoprim  (BACTRIM  DS) 800-160 MG tablet Take 1 tablet by mouth 2 (two) times daily for 10 days. 01/04/24 01/14/24 Yes Leath-Warren, Etta JINNY, NP  acetaminophen  (TYLENOL ) 500 MG tablet Take 500-1,000 mg by mouth every 6 (six) hours as needed for moderate pain (pain score 4-6).    [provider]  allopurinol  (ZYLOPRIM ) 100 MG tablet TAKE 1 TABLET(100 MG) BY MOUTH DAILY 09/07/20   Duanne Butler DASEN, MD  apalutamide  (ERLEADA ) 60 MG tablet TAKE 4 TABLETS (240 MG TOTAL) BY MOUTH DAILY. MAY BE TAKEN WITH OR WITHOUT FOOD. SWALLOW TABLETS WHOLE. 10/23/23 10/22/24  Rogers Hai, MD  Cyanocobalamin (VITAMIN B 12) 500 MCG TABS Take 500 mcg by mouth in the morning.    [provider]  FARXIGA  5 MG TABS tablet Take 1 tablet (5 mg total) by mouth daily. 08/22/23   Therisa Benton JINNY, NP  finasteride  (PROSCAR ) 5 MG tablet TAKE 1 TABLET(5 MG) BY MOUTH DAILY 09/20/23   Gerldine Lauraine BROCKS, FNP  fluticasone  (FLONASE ) 50 MCG/ACT nasal spray Place 2 sprays into both nostrils daily. 06/02/20   Chandra Harlene LABOR, NP  isosorbide  mononitrate (IMDUR ) 30 MG 24 hr tablet TAKE 1 TABLET(30 MG) BY MOUTH DAILY 11/19/23   Birmingham Danelle LELON, MD  levothyroxine  (SYNTHROID ) 200 MCG tablet Take 1 tablet (200 mcg total) by mouth daily before breakfast. 08/22/23   Therisa Benton JINNY, NP  levothyroxine  (SYNTHROID ) 25 MCG tablet Take 1 tablet (25 mcg total) by mouth See admin instructions. Takes along with 200  mcg to total 225 mcg daily 08/22/23   Therisa Benton JINNY, NP  LINZESS 145 MCG CAPS capsule Take 145 mcg by mouth every morning. 07/31/23   [provider]  loratadine  (CLARITIN ) 10 MG tablet TAKE 1 TABLET(10 MG) BY MOUTH DAILY AS NEEDED FOR ALLERGIES 04/19/20   Lemoyne, Kawanta F, MD  naloxone [facility]) nasal spray 4 mg/0.1 mL Place 4 mg into the nose once as needed (accidental overdose). 01/28/21   [provider]  nitroGLYCERIN  (NITROSTAT ) 0.4 MG SL tablet PLACE 1 TABLET UNDER THE TONGUE EVERY 5 MINUTES AS NEEDED FOR CHEST AIN CALL 911 AT THIRD DOSE IN 15 MINUTES 09/19/23  Waddell Danelle ORN, MD  [facility] VERIO test strip daily. 06/09/23   [provider]  oxyCODONE -acetaminophen  (PERCOCET) 10-325 MG tablet Take 1 tablet by mouth every 8 (eight) hours as needed for pain. 08/13/20   Bari Theodoro FALCON, MD  pantoprazole  (PROTONIX ) 40 MG tablet TAKE 1 TABLET(40 MG) BY MOUTH DAILY 07/19/20   Bari Theodoro FALCON, MD  phenazopyridine  (PYRIDIUM ) 100 MG tablet Take 1 tablet (100 mg total) by mouth 3 (three) times daily with meals. 06/10/23   Akula, Vijaya, MD  rosuvastatin  (CRESTOR ) 40 MG tablet Take 40 mg by mouth every evening. 04/02/21   [provider]  silodosin  (RAPAFLO ) 8 MG CAPS capsule TAKE 1 CAPSULE BY MOUTH TWICE DAILY 04/26/23   Matilda Senior, MD  solifenacin  (VESICARE ) 10 MG tablet TAKE 1 TABLET(10 MG) BY MOUTH DAILY 05/25/23   Matilda Senior, MD  sotalol  (BETAPACE ) 80 MG tablet TAKE 1 TABLET(80 MG) BY MOUTH TWICE DAILY 10/09/23   Waddell Danelle ORN, MD  Vibegron [facility]) 75 MG TABS Take 75 mg by mouth every Monday, Wednesday, and Friday.    [provider]  Vitamin D , Ergocalciferol , (DRISDOL ) 1.25 MG (50000 UNIT) CAPS capsule TAKE 1 CAPSULE BY MOUTH EVERY 7 DAYS 06/25/20   Bari Theodoro FALCON, MD    Family History Family History  Problem Relation Age of Onset   Hypertension Mother        Cerebrovascular disease   Diabetes Mother    Coronary artery disease  Father 65   Diabetes type II Father    Hypertension Father    Heart attack Father    Diabetes Brother    Hypertension Brother    Diabetes Sister    Hypertension Sister    Heart attack Sister 67   Leukemia Sister 61       CML   Breast cancer Maternal Grandmother        dx 61s   Lung cancer Maternal Uncle        dx >50, smoker   Cancer Cousin        mouth cancer, dx 19s, no smoking/chew tobacco hx (maternal 1st cousin)   Ovarian cancer Cousin        dx <50 (maternal 1st cousin)   Stomach cancer Cousin        dx 77s (maternal 1st cousin)   Cancer Cousin        type unknown to pt, dx >50 (paternal 1st cousin)    Social History Social History   Tobacco Use   Smoking status: Former    Current packs/day: 0.00    Average packs/day: 1 pack/day for 40.0 years (40.0 ttl pk-yrs)    Types: Cigarettes    Start date: 10/20/1972    Quit date: 10/10/2012    Years since quitting: 11.2   Smokeless tobacco: Never   Tobacco comments:    Quit in May.   Vaping Use   Vaping status: Never Used  Substance Use Topics   Alcohol use: Not Currently    Comment: former drinker-- sober since 2013.    Drug use: Not Currently    Types: Cocaine    Comment: quit cocaine 10/2011     Allergies   Lactose intolerance (gi) and Trazodone  and nefazodone   Review of Systems Review of Systems Per HPI  Physical Exam Triage Vital Signs ED Triage Vitals [01/04/24 1356]  Encounter Vitals Group     BP 103/68     Girls Systolic BP Percentile      Girls Diastolic BP  Percentile      Boys Systolic BP Percentile      Boys Diastolic BP Percentile      Pulse Rate 60     Resp 16     Temp 98.3 F (36.8 C)     Temp Source Oral     SpO2 95 %     Weight      Height      Head Circumference      Peak Flow      Pain Score 0     Pain Loc      Pain Education      Exclude from Growth Chart    No data found.  Updated Vital Signs BP 103/68 (BP Location: Right Arm)   Pulse 60   Temp 98.3 F (36.8 C)  (Oral)   Resp 16   SpO2 95%   Visual Acuity Right Eye Distance:   Left Eye Distance:   Bilateral Distance:    Right Eye Near:   Left Eye Near:    Bilateral Near:     Physical Exam Vitals and nursing note reviewed.  Constitutional:      General: He is not in acute distress.    Appearance: Normal appearance.  HENT:     Head: Normocephalic.  Eyes:     Extraocular Movements: Extraocular movements intact.     Pupils: Pupils are equal, round, and reactive to light.  Cardiovascular:     Rate and Rhythm: Normal rate and regular rhythm.     Pulses: Normal pulses.     Heart sounds: Normal heart sounds.  Pulmonary:     Effort: Pulmonary effort is normal.     Breath sounds: Normal breath sounds.  Abdominal:     General: Bowel sounds are normal.     Palpations: Abdomen is soft.     Tenderness: There is no abdominal tenderness. There is no right CVA tenderness or left CVA tenderness.  Musculoskeletal:     Cervical back: Normal range of motion.  Skin:    General: Skin is warm and dry.  Neurological:     General: No focal deficit present.     Mental Status: He is alert and oriented to person, place, and time.  Psychiatric:        Mood and Affect: Mood normal.        Behavior: Behavior normal.      UC Treatments / Results  Labs (all labs ordered are listed, but only abnormal results are displayed) Labs Reviewed  POCT URINE DIPSTICK - Abnormal; Notable for the following components:      Result Value   Color, UA red (*)    Clarity, UA cloudy (*)    Glucose, UA =500 (*)    Bilirubin, UA large (*)    Ketones, POC UA moderate (40) (*)    Spec Grav, UA <=1.005 (*)    Blood, UA large (*)    pH, UA 8.5 (*)    Protein Ur, POC >=300 (*)    Urobilinogen, UA >=8.0 (*)    Nitrite, UA Positive (*)    Leukocytes, UA Large (3+) (*)    All other components within normal limits  URINE CULTURE    EKG   Radiology No results found.  Procedures Procedures (including critical  care time)  Medications Ordered in UC Medications - No data to display  Initial Impression / Assessment and Plan / UC Course  I have reviewed the triage vital signs and the nursing notes.  Pertinent labs & imaging results that were available during my care of the patient were reviewed by me and considered in my medical decision making (see chart for details).  Patient presents for hematuria that started this morning.  Was able to look at the patient's urine, and patient with moderate blood noted.  Urinalysis is positive for nitrites and leukocytes, consistent with acute cystitis.  Will treat with Bactrim  DS 800/160 mg tablets (reviewed most recent GFR which was greater than 60).  Will also have patient hold his aspirin  for the next 5 days.  Message was sent to patient's urologist regarding his current symptoms.  Patient was trying to get a sooner appointment with his urologist, patient advised to call to see if appointment can be moved.  Patient was advised to go to the emergency department if hematuria does not improve within the next 12 to 24 hours.  Supportive care recommendations were provided discussed with the patient to include fluids, developing a toileting schedule to allow him to urinate least every 2 hours, and over-the-counter analgesics.  Patient was in agreement with this plan of care and verbalizes understanding.  All questions were answered.  Patient stable for discharge.   Final Clinical Impressions(s) / UC Diagnoses   Final diagnoses:  Acute cystitis with hematuria  Gross hematuria     Discharge Instructions      A urine culture has been ordered.  You will be contacted if the results of the culture show that the medication prescribed today needs to be changed. Take medication as prescribed.  Recommend holding your aspirin  for at least 5 days until your urine clears. You may take Tylenol  as needed for pain, fever, or general discomfort. Make sure you are drinking at least  8-10 8 ounce glasses of water  daily. Develop a toileting schedule that will allow you to urinate at least every 2 hours. Avoid caffeine such as tea, soda, or coffee while symptoms persist. As discussed, if your urine has not cleared by tomorrow, I would like for you to follow-up in the emergency department for further evaluation. I have sent a message to your urologist.  Please contact their office to see if there is a sooner appointment for further evaluation. Follow-up as needed.     ED Prescriptions     Medication Sig Dispense Auth. Provider   sulfamethoxazole -trimethoprim  (BACTRIM  DS) 800-160 MG tablet Take 1 tablet by mouth 2 (two) times daily for 10 days. 20 tablet Leath-Warren, Etta PARAS, NP      PDMP not reviewed this encounter.   Gilmer Etta PARAS, NP 01/04/24 1552

## 2024-01-05 ENCOUNTER — Encounter (HOSPITAL_COMMUNITY): Payer: Self-pay

## 2024-01-05 ENCOUNTER — Emergency Department (HOSPITAL_COMMUNITY)
Admission: EM | Admit: 2024-01-05 | Discharge: 2024-01-05 | Disposition: A | Discharge status: Home / Self Care | Attending: Emergency Medicine | Admitting: Emergency Medicine

## 2024-01-05 ENCOUNTER — Other Ambulatory Visit: Payer: Self-pay

## 2024-01-05 DIAGNOSIS — Z79899 Other long term (current) drug therapy: Secondary | ICD-10-CM | POA: Diagnosis not present

## 2024-01-05 DIAGNOSIS — I10 Essential (primary) hypertension: Secondary | ICD-10-CM | POA: Insufficient documentation

## 2024-01-05 DIAGNOSIS — I2510 Atherosclerotic heart disease of native coronary artery without angina pectoris: Secondary | ICD-10-CM | POA: Insufficient documentation

## 2024-01-05 DIAGNOSIS — Z8546 Personal history of malignant neoplasm of prostate: Secondary | ICD-10-CM | POA: Diagnosis not present

## 2024-01-05 DIAGNOSIS — N3001 Acute cystitis with hematuria: Secondary | ICD-10-CM | POA: Insufficient documentation

## 2024-01-05 DIAGNOSIS — R319 Hematuria, unspecified: Secondary | ICD-10-CM | POA: Diagnosis present

## 2024-01-05 LAB — CBC WITH DIFFERENTIAL/PLATELET
Abs Granulocyte: 2.5 K/uL (ref 1.5–6.5)
Abs Immature Granulocytes: 0.01 K/uL (ref 0.00–0.07)
Basophils Absolute: 0.1 K/uL (ref 0.0–0.1)
Basophils Relative: 2 %
Eosinophils Absolute: 0.1 K/uL (ref 0.0–0.5)
Eosinophils Relative: 3 %
HCT: 35.4 % — ABNORMAL LOW (ref 39.0–52.0)
Hemoglobin: 11.6 g/dL — ABNORMAL LOW (ref 13.0–17.0)
Immature Granulocytes: 0 %
Lymphocytes Relative: 25 %
Lymphs Abs: 1 K/uL (ref 0.7–4.0)
MCH: 33 pg (ref 26.0–34.0)
MCHC: 32.8 g/dL (ref 30.0–36.0)
MCV: 100.9 fL — ABNORMAL HIGH (ref 80.0–100.0)
Monocytes Absolute: 0.4 K/uL (ref 0.1–1.0)
Monocytes Relative: 9 %
Neutro Abs: 2.5 K/uL (ref 1.7–7.7)
Neutrophils Relative %: 61 %
Platelets: 89 K/uL — ABNORMAL LOW (ref 150–400)
RBC: 3.51 MIL/uL — ABNORMAL LOW (ref 4.22–5.81)
RDW: 15.7 % — ABNORMAL HIGH (ref 11.5–15.5)
Smear Review: NORMAL
WBC: 4.1 K/uL (ref 4.0–10.5)
nRBC: 0 % (ref 0.0–0.2)

## 2024-01-05 LAB — URINALYSIS, MICROSCOPIC (REFLEX): RBC / HPF: >50 RBC/hpf (ref 0–5)

## 2024-01-05 LAB — BASIC METABOLIC PANEL WITH GFR
Anion gap: 10 (ref 5–15)
BUN: 11 mg/dL (ref 8–23)
CO2: 20 mmol/L — ABNORMAL LOW (ref 22–32)
Calcium: 8.6 mg/dL — ABNORMAL LOW (ref 8.9–10.3)
Chloride: 108 mmol/L (ref 98–111)
Creatinine, Ser: 1.06 mg/dL (ref 0.61–1.24)
GFR, Estimated: >60 mL/min (ref >60)
Glucose, Bld: 145 mg/dL — ABNORMAL HIGH (ref 70–99)
Potassium: 4.1 mmol/L (ref 3.5–5.1)
Sodium: 138 mmol/L (ref 135–145)

## 2024-01-05 LAB — URINALYSIS, ROUTINE W REFLEX MICROSCOPIC
Bilirubin Urine: NEGATIVE
Glucose, UA: >=500 mg/dL — AB
Ketones, ur: 15 mg/dL — AB
Nitrite: POSITIVE — AB
Protein, ur: >300 mg/dL — AB
Specific Gravity, Urine: 1.015 (ref 1.005–1.030)
pH: 7 (ref 5.0–8.0)

## 2024-01-05 LAB — URINE CULTURE: Culture: NO GROWTH

## 2024-01-05 MED ORDER — CEPHALEXIN 250 MG PO CAPS
500.0000 mg | ORAL_CAPSULE | Freq: Once | ORAL | Status: AC
  Administered 2024-01-05: 500 mg via ORAL
  Filled 2024-01-05: qty 2

## 2024-01-05 MED ORDER — CEPHALEXIN 500 MG PO CAPS: 500.0000 mg | ORAL_CAPSULE | Freq: Two times a day (BID) | ORAL | 0 refills | Status: DC

## 2024-01-05 NOTE — ED Triage Notes (Addendum)
 Pt c.o hematuria since yesterday morning. Pt has hx of prostate cancer with radiation, states this has happened before and was told it was related to the past radiation. C.o some pain. Pt seen at [facility] yesterday and diagnosed with cystitis.

## 2024-01-05 NOTE — ED Provider Notes (Signed)
 [place] EMERGENCY DEPARTMENT AT [facility] Provider Note   CSN: [id number] Arrival date & time: 01/05/24  9344     Patient presents with: Hematuria   Peter Dunlap is a 68 y.o. male.   68 year old male with a history of prostate cancer (s/p radiation in 2017; on leuprolide , apalutamide ), BPH, HTN, CAD, and HLD presents to the emergency department for evaluation of hematuria.  He reports that symptoms began yesterday morning.  He has associated burning dysuria as well as urinary urgency and frequency.  Was started on Bactrim  yesterday for presumed urinary tract infection; culture is currently pending.  He has taken 1 tablet of this antibiotic thus far.  Was told to come to the emergency department if his bleeding continued.  He is on 324 mg aspirin  daily, but has not taken this today as he was advised to stop it while hematuria was present.  Denies use of any other anticoagulants.  No syncope, shortness of breath, fevers.  Reports having follow-up appointment scheduled with Dr. Matilda on the 12th.   Hematuria       Prior to Admission medications   Medication Sig Start Date End Date Taking? Authorizing Provider  cephALEXin  (KEFLEX ) 500 MG capsule Take 1 capsule (500 mg total) by mouth 2 (two) times daily. 01/05/24  Yes Keith Sor, PA-C  acetaminophen  (TYLENOL ) 500 MG tablet Take 500-1,000 mg by mouth every 6 (six) hours as needed for moderate pain (pain score 4-6).    [provider]  allopurinol  (ZYLOPRIM ) 100 MG tablet TAKE 1 TABLET(100 MG) BY MOUTH DAILY 09/07/20   Duanne Butler DASEN, MD  apalutamide  (ERLEADA ) 60 MG tablet TAKE 4 TABLETS (240 MG TOTAL) BY MOUTH DAILY. MAY BE TAKEN WITH OR WITHOUT FOOD. SWALLOW TABLETS WHOLE. 10/23/23 10/22/24  Rogers Hai, MD  Cyanocobalamin (VITAMIN B 12) 500 MCG TABS Take 500 mcg by mouth in the morning.    [provider]  FARXIGA  5 MG TABS tablet Take 1 tablet (5 mg total) by mouth daily. 08/22/23    Therisa Benton PARAS, NP  finasteride  (PROSCAR ) 5 MG tablet TAKE 1 TABLET(5 MG) BY MOUTH DAILY 09/20/23   Gerldine Lauraine BROCKS, FNP  fluticasone  (FLONASE ) 50 MCG/ACT nasal spray Place 2 sprays into both nostrils daily. 06/02/20   Chandra Harlene LABOR, NP  isosorbide  mononitrate (IMDUR ) 30 MG 24 hr tablet TAKE 1 TABLET(30 MG) BY MOUTH DAILY 11/19/23   Waddell Danelle ORN, MD  levothyroxine  (SYNTHROID ) 200 MCG tablet Take 1 tablet (200 mcg total) by mouth daily before breakfast. 08/22/23   Therisa Benton PARAS, NP  levothyroxine  (SYNTHROID ) 25 MCG tablet Take 1 tablet (25 mcg total) by mouth See admin instructions. Takes along with 200 mcg to total 225 mcg daily 08/22/23   Therisa Benton PARAS, NP  LINZESS 145 MCG CAPS capsule Take 145 mcg by mouth every morning. 07/31/23   [provider]  loratadine  (CLARITIN ) 10 MG tablet TAKE 1 TABLET(10 MG) BY MOUTH DAILY AS NEEDED FOR ALLERGIES 04/19/20   Keokee, Kawanta F, MD  naloxone [facility] Iv) nasal spray 4 mg/0.1 mL Place 4 mg into the nose once as needed (accidental overdose). 01/28/21   [provider]  nitroGLYCERIN  (NITROSTAT ) 0.4 MG SL tablet PLACE 1 TABLET UNDER THE TONGUE EVERY 5 MINUTES AS NEEDED FOR CHEST AIN CALL 911 AT THIRD DOSE IN 15 MINUTES 09/19/23   Waddell Danelle ORN, MD  [facility] VERIO test strip daily. 06/09/23   [provider]  oxyCODONE -acetaminophen  (PERCOCET) 10-325 MG tablet Take 1  tablet by mouth every 8 (eight) hours as needed for pain. 08/13/20   Bari Theodoro FALCON, MD  pantoprazole  (PROTONIX ) 40 MG tablet TAKE 1 TABLET(40 MG) BY MOUTH DAILY 07/19/20   Bari Theodoro FALCON, MD  phenazopyridine  (PYRIDIUM ) 100 MG tablet Take 1 tablet (100 mg total) by mouth 3 (three) times daily with meals. 06/10/23   Akula, Vijaya, MD  rosuvastatin  (CRESTOR ) 40 MG tablet Take 40 mg by mouth every evening. 04/02/21   [provider]  silodosin  (RAPAFLO ) 8 MG CAPS capsule TAKE 1 CAPSULE BY MOUTH TWICE DAILY 04/26/23   Matilda Senior, MD   solifenacin  (VESICARE ) 10 MG tablet TAKE 1 TABLET(10 MG) BY MOUTH DAILY 05/25/23   Matilda Senior, MD  sotalol  (BETAPACE ) 80 MG tablet TAKE 1 TABLET(80 MG) BY MOUTH TWICE DAILY 10/09/23   Waddell Danelle ORN, MD  Vibegron [facility]) 75 MG TABS Take 75 mg by mouth every Monday, Wednesday, and Friday.    [provider]  Vitamin D , Ergocalciferol , (DRISDOL ) 1.25 MG (50000 UNIT) CAPS capsule TAKE 1 CAPSULE BY MOUTH EVERY 7 DAYS 06/25/20   Bari Theodoro FALCON, MD    Allergies: Lactose intolerance (gi) and Trazodone  and nefazodone    Review of Systems  Genitourinary:  Positive for hematuria.  Ten systems reviewed and are negative for acute change, except as noted in the HPI.    Updated Vital Signs BP (!) 115/52 (BP Location: Right Arm)   Pulse (!) 59   Temp 98.3 F (36.8 C) (Oral)   Resp 18   SpO2 100%   Physical Exam Vitals and nursing note reviewed.  Constitutional:      General: He is not in acute distress.    Appearance: He is well-developed. He is not diaphoretic.     Comments: Nontoxic appearing, AA male. Pleasant.   HENT:     Head: Normocephalic and atraumatic.  Eyes:     General: No scleral icterus.    Conjunctiva/sclera: Conjunctivae normal.  Pulmonary:     Effort: Pulmonary effort is normal. No respiratory distress.     Comments: Respirations even and unlabored Musculoskeletal:        General: Normal range of motion.     Cervical back: Normal range of motion.  Skin:    General: Skin is warm and dry.     Coloration: Skin is not pale.     Findings: No erythema or rash.  Neurological:     Mental Status: He is alert and oriented to person, place, and time.     Coordination: Coordination normal.  Psychiatric:        Behavior: Behavior normal.     (all labs ordered are listed, but only abnormal results are displayed) Labs Reviewed  URINALYSIS, ROUTINE W REFLEX MICROSCOPIC - Abnormal; Notable for the following components:      Result Value   Color, Urine RED  (*)    APPearance CLOUDY (*)    Glucose, UA >=500 (*)    Hgb urine dipstick LARGE (*)    Ketones, ur 15 (*)    Protein, ur >300 (*)    Nitrite POSITIVE (*)    Leukocytes,Ua SMALL (*)    All other components within normal limits  BASIC METABOLIC PANEL WITH GFR - Abnormal; Notable for the following components:   CO2 20 (*)    Glucose, Bld 145 (*)    Calcium  8.6 (*)    All other components within normal limits  CBC WITH DIFFERENTIAL/PLATELET - Abnormal; Notable for the following components:  RBC 3.51 (*)    Hemoglobin 11.6 (*)    HCT 35.4 (*)    MCV 100.9 (*)    RDW 15.7 (*)    Platelets 89 (*)    All other components within normal limits  URINALYSIS, MICROSCOPIC (REFLEX) - Abnormal; Notable for the following components:   Bacteria, UA MANY (*)    All other components within normal limits    EKG: None  Radiology: No results found.   Procedures   Medications Ordered in the ED  cephALEXin  (KEFLEX ) capsule 500 mg (500 mg Oral Given 01/05/24 1044)    Clinical Course as of 01/05/24 1047  Sat Jan 05, 2024  9153 Per chart review, patient underwent cystoscopy, bladder biopsy and fulguration of bleeders on 06/21/2023 by Dr. Matilda. 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 [KH]  1032 Bladder scan with 49cc urine. [KH]    Clinical Course User Index [KH] Keith Sor, PA-C                                 Medical Decision Making Amount and/or Complexity of Data Reviewed Labs: ordered.  Risk Prescription drug management.   This patient presents to the ED for concern of hematuria, this involves an extensive number of treatment options, and is a complaint that carries with it a high risk of complications and morbidity.  The differential diagnosis includes radiation cystitis vs UTI vs bladder cancer vs kidney stone.   Co morbidities that complicate the patient evaluation  Prostate CA BPH HTN CAD   Additional history obtained:  External records from outside  source obtained and reviewed including UA from 01/04/24; culture pending.   Lab Tests:  I Ordered, and personally interpreted labs.  The pertinent results include:  Hgb 11.6 (stable, c/w baseline), platelets 89 (previously 121), Creatinine 1.06. UA nitrite positive with bacteriuria, 0-5 WBCs, >50 RBCs.   Cardiac Monitoring:  The patient was maintained on a cardiac monitor.  I personally viewed and interpreted the cardiac monitored which showed an underlying rhythm of: NSR   Medicines ordered and prescription drug management:  I ordered medication including Keflex  for change in therapy for UTI  Reevaluation of the patient after these medicines showed that the patient stayed the same I have reviewed the patients home medicines and have made adjustments as needed   Test Considered:  Renal US    Problem List / ED Course:  Patient with ongoing hematuria.  Was told to come to the emergency department if symptoms persisted.  Was seen at urgent care yesterday and diagnosed with a UTI. Does have bacteriuria and nitrites on urinalysis today.  Seems reasonable to consider UTI as infection could certainly be provoking onset of hematuria.  Do not feel that Bactrim  is the most appropriate antibiotic for the patient.  Will change to Keflex  pending urologic follow-up. No signs of urinary retention or obstruction in the setting of hematuria.  Bladder scan with <50cc urine.  Kidney function preserved. Hgb stable. No tachycardia or hypotension.  Noted to have thrombocytopenia, but platelets low at 121 1 month ago.  Will necessitate outpatient trending. No SIRS criteria to raise concern for sepsis.   Reevaluation:  After the interventions noted above, I reevaluated the patient and found that they have :stayed the same   Social Determinants of Health:  Lives with spouse   Dispostion:  After consideration of the diagnostic results and the patients response to treatment, I feel that  the patent  would benefit from change in antibiotic therapy from Bactrim  to Keflex .  This was initiated in the ED prior to discharge.  Encouraged patient to follow-up with urology.  Also recommended that patient restart his aspirin  tomorrow as I feel that there would be increased cardiovascular risk from holding this medication; he is hemodynamically stable in the emergency department with stable hemoglobin compared to baseline, no signs of acute/emergent blood loss. Return precautions discussed and provided. Patient discharged in stable condition with no unaddressed concerns.       Final diagnoses:  Acute cystitis with hematuria    ED Discharge Orders          Ordered    cephALEXin  (KEFLEX ) 500 MG capsule  2 times daily        01/05/24 1033               Keith Sor, [place] 01/05/24 1058    Tegeler, Lonni PARAS, MD 01/05/24 1145

## 2024-01-05 NOTE — Discharge Instructions (Addendum)
 We have changed her antibiotic from Bactrim  to Keflex .  Pick up your new antibiotic prescription at your pharmacy and take as prescribed.  You will be notified if your urine culture necessitate changes to your current regimen.  Follow-up with your urologist for further evaluation of your symptoms.  Should you develop signs of urinary retention, especially in the setting of bloody urine, return to the emergency department for evaluation.  You may also return for other new or concerning symptoms such as worsening abdominal pain, ongoing vomiting, or chills or fever over 100.4 F.

## 2024-01-07 ENCOUNTER — Ambulatory Visit (HOSPITAL_COMMUNITY): Payer: Self-pay

## 2024-01-07 ENCOUNTER — Other Ambulatory Visit: Payer: Self-pay | Admitting: Internal Medicine

## 2024-01-07 NOTE — Progress Notes (Unsigned)
 Impression/Assessment:  1.  Castrate sensitive metastatic prostate cancer, on leuprolide , apalutamide  with excellent PSA response that is durable.  Most recent PSA close to 0  2.  Osteopenia, on Prolia.  Last bone density study was over 2 years ago  3.  BPH with lower urinary tract symptoms, on silodosin , Gemtesa and Solifenacin , still doing well, emptying well.  4.  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  Recent recurrence.  He seems to be emptying well, urine clear today  5.  Meatal stenosis, voiding adequately  Plan: 1.  He will stay off aspirin  for another whole week  2.  I let him know that if he has recurrences of this hematuria we should proceed with HBO  3.  I will have him come back in 6 weeks to recheck  History of Present Illness: Peter Dunlap is here today for follow-up of prostate cancer and BPH w/ LUTS:    Prostate cancer:    He underwent TRUS/Bx on 8.15.2017. At that time, PSA was 9.09. Prostatic volume was 25 cc. 10/12 cores came back positive for adenocarcinoma as follows: 1 core revealed GS 3+3 5 cores revealed GS 3+4 4 cores revealed GS 4+3   He completed IMRT, 40 sessions, on 1.10.2018   8.24.2021: Repeat TRUS/Bx for elevating PSA trend following definitive Rx. 1/12 cores-from the left mid lateral region-came back positive for adenocarcinoma.  Grading was difficult due to his prior radiation.     10.15.2021: -CT A/P  with newly enlarged left iliac lymph nodes measuring 1.1 x 0.9 cm.  Hepatic steatosis with early signs of cirrhosis.   04/12/2020 -F-18 PSMA PET scan on showed radiotracer activity associated with enlarged left external iliac lymph node 9 mm with SUV 7.6.  Small left common iliac lymph node 6 mm, SUV 8.6.  Left periaortic lymph node 5 mm, SUV 6.3.  Lymph node between IVC and aorta at the level of right renal hilum, SUV 8.6.  Lymph node deep to the IVC measuring 6 mm, SUV 5.4.  No skeletal metastasis.   11.17.2021: Began ADT  with Firmagon  and Erleada .   7.5.2022: Continues Firmagon  and Erleada   Most recent PSA is less than 0.1.  Testosterone  level 5.   11.9.2022: PSA < 0.01. T level castrate.  Continues Firmagon  and Erleada    7.18.2023: PSA is 0.01, testosterone  level still castrate.  Continues on Erleada  and Firmagon .    11.21.2023: Most recent PSA in September less than 0.01, nadir level for him.  He did experience hematuria in September-passed a clot,   5.21.2024: PSA 2 months ago was still undetectable.  Still with urinary frequency, urgency and urgency incontinence as well as nocturia.  On Rapaflo  twice a day as well as Solifenacin  10 mg.   8.7.2024:  He was switched to leuprolide  45 mg from his monthly Firmagon  on the 18th of last month.  He is much more comfortable with this.  Most recent PSA was undetectable.  At his last visit he was started on Gemtesa in addition to his Solifenacin .    11.11.2024: He follows regularly with Dr. Rogers.  He is on leuprolide  45 mg every 6 months, last given in July.  Also on apalutamide  and Prolia.  Most recent PSA 2 months ago is less than 0.1.  He is having no hot flashes.  He takes vitamin D  supplements.  He does get some exercise-walking on a regular basis.  No bony pain.     BPH   11.12.2024: He denies significant lower urinary  tract symptoms.  He is having no blood in his urine.  He is on Solifenacin  and alfuzosin   Gross hematuria:  He underwent cystoscopy, bladder biopsy and fulguration of bleeders on 06/21/2023.  Bladder biopsies revealed benign findings.  He has not noticed any blood in his urine since the procedure.  He is having spraying with urination.  He is having worsening urgency incontinence.    7.15.2025: He is voiding well.  He has no gross hematuria.  Spraying of stream is still present but less than several months ago.  He feels like he is emptying adequately.  He has not experienced gross hematuria.  8.5.2025: He comes in today for follow-up.   On the first of this month he began with recurrent gross painless hematuria.  Went to the urgent care then the emergency room.  Urine culture was negative, he was placed on antibiotics.  Temporarily taken off of aspirin  which he has restarted.  Hematuria has cleared.   Past Surgical History:  Procedure Laterality Date   BRAIN SURGERY  2015   hematoma evacuation   BURR HOLE Right 04/13/2014   Procedure: SOLMON HERRLICH;  Surgeon: Victory DELENA Gunnels, MD;  Location: [facility];  Service: Neurosurgery;  Laterality: Right;   CAROTID ENDARTERECTOMY Right Feb. 25, 2010    CEA   COLONOSCOPY WITH PROPOFOL  N/A 06/24/2021   Procedure: COLONOSCOPY WITH PROPOFOL ;  Surgeon: Rollin Dover, MD;  Location: [facility] ENDOSCOPY;  Service: Endoscopy;  Laterality: N/A;   CORONARY ANGIOPLASTY WITH STENT PLACEMENT  12/03/2013   LAD 90%-->0% W/ Promus Premier DES 3.0 mm x 16 mm, CFX OK, RCA 40%, EF 70-75%   CYSTOSCOPY WITH FULGERATION N/A 06/21/2023   Procedure: CYSTOSCOPY WITH FULGERATION and clot evacuation;  Surgeon: Matilda Senior, MD;  Location: [facility];  Service: Urology;  Laterality: N/A;   LEFT ATRIAL APPENDAGE OCCLUSION N/A 08/05/2015   Procedure: LEFT ATRIAL APPENDAGE OCCLUSION;  Surgeon: Lynwood Rakers, MD;  Location: [facility] INVASIVE CV LAB;  Service: Cardiovascular;  Laterality: N/A;   LEFT HEART CATH AND CORONARY ANGIOGRAPHY N/A 09/13/2021   Procedure: LEFT HEART CATH AND CORONARY ANGIOGRAPHY;  Surgeon: Wendel Lurena POUR, MD;  Location: [facility] CV [facility];  Service: Cardiovascular;  Laterality: N/A;   LEFT HEART CATHETERIZATION WITH CORONARY ANGIOGRAM Left 12/03/2013   Procedure: LEFT HEART CATHETERIZATION WITH CORONARY ANGIOGRAM;  Surgeon: Alm LELON Clay, MD;  Location: [facility] CATH LAB;  Service: Cardiovascular;  Laterality: Left;   LEFT HEART CATHETERIZATION WITH CORONARY ANGIOGRAM N/A 01/26/2014   Procedure: LEFT HEART CATHETERIZATION WITH CORONARY ANGIOGRAM;  Surgeon: Candyce GORMAN Reek, MD;  Location: [facility]  CATH LAB;  Service: Cardiovascular;  Laterality: N/A;   LEFT HEART CATHETERIZATION WITH CORONARY ANGIOGRAM N/A 08/03/2014   Procedure: LEFT HEART CATHETERIZATION WITH CORONARY ANGIOGRAM;  Surgeon: Lonni JONETTA Cash, MD;  Location: [facility] CATH LAB;  Service: Cardiovascular;  Laterality: N/A;   PERCUTANEOUS CORONARY STENT INTERVENTION (PCI-S)  12/03/2013   Procedure: PERCUTANEOUS CORONARY STENT INTERVENTION (PCI-S);  Surgeon: Alm LELON Clay, MD;  Location: [facility] CATH LAB;  Service: Cardiovascular;;   PERMANENT PACEMAKER INSERTION N/A 09/18/2014   Procedure: PERMANENT PACEMAKER INSERTION;  Surgeon: Danelle LELON Birmingham, MD;  Location: [facility] CATH LAB;  Service: Cardiovascular;  Laterality: N/A;   POLYPECTOMY  06/24/2021   Procedure: POLYPECTOMY;  Surgeon: Rollin Dover, MD;  Location: [facility] ENDOSCOPY;  Service: Endoscopy;;   RADIOFREQUENCY ABLATION  2005   for PSVT   TEE WITHOUT CARDIOVERSION N/A 07/27/2015   Procedure: TRANSESOPHAGEAL ECHOCARDIOGRAM (TEE);  Surgeon: Redell  GORMAN Shallow, MD;  Location: [facility] ENDOSCOPY;  Service: Cardiovascular;  Laterality: N/A;   TEE WITHOUT CARDIOVERSION N/A 09/15/2015   Procedure: TRANSESOPHAGEAL ECHOCARDIOGRAM (TEE);  Surgeon: Aleene JINNY Passe, MD;  Location: [facility] ENDOSCOPY;  Service: Cardiovascular;  Laterality: N/A;    Home Medications:  Allergies as of 01/08/2024       Reactions   Lactose Intolerance (gi) Other (See Comments)   UPSET STOMACH   Trazodone  And Nefazodone    Nightmares        Medication List        Accurate as of January 07, 2024  7:08 PM. If you have any questions, ask your nurse or doctor.          acetaminophen  500 MG tablet Commonly known as: TYLENOL  Take 500-1,000 mg by mouth every 6 (six) hours as needed for moderate pain (pain score 4-6).   allopurinol  100 MG tablet Commonly known as: ZYLOPRIM  TAKE 1 TABLET(100 MG) BY MOUTH DAILY   cephALEXin  500 MG capsule Commonly known as: KEFLEX  Take 1 capsule (500 mg total) by mouth 2 (two) times daily.    Erleada  60 MG tablet Generic drug: apalutamide  TAKE 4 TABLETS (240 MG TOTAL) BY MOUTH DAILY. MAY BE TAKEN WITH OR WITHOUT FOOD. SWALLOW TABLETS WHOLE.   Farxiga  5 MG Tabs tablet Generic drug: dapagliflozin  propanediol Take 1 tablet (5 mg total) by mouth daily.   finasteride  5 MG tablet Commonly known as: PROSCAR  TAKE 1 TABLET(5 MG) BY MOUTH DAILY   fluticasone  50 MCG/ACT nasal spray Commonly known as: FLONASE  Place 2 sprays into both nostrils daily.   Gemtesa 75 MG Tabs Generic drug: Vibegron Take 75 mg by mouth every Monday, Wednesday, and Friday.   isosorbide  mononitrate 30 MG 24 hr tablet Commonly known as: IMDUR  TAKE 1 TABLET(30 MG) BY MOUTH DAILY   levothyroxine  25 MCG tablet Commonly known as: SYNTHROID  Take 1 tablet (25 mcg total) by mouth See admin instructions. Takes along with 200 mcg to total 225 mcg daily   levothyroxine  200 MCG tablet Commonly known as: SYNTHROID  Take 1 tablet (200 mcg total) by mouth daily before breakfast.   Linzess 145 MCG Caps capsule Generic drug: linaclotide Take 145 mcg by mouth every morning.   loratadine  10 MG tablet Commonly known as: CLARITIN  TAKE 1 TABLET(10 MG) BY MOUTH DAILY AS NEEDED FOR ALLERGIES   naloxone 4 MG/0.1ML Liqd nasal spray kit Commonly known as: NARCAN Place 4 mg into the nose once as needed (accidental overdose).   nitroGLYCERIN  0.4 MG SL tablet Commonly known as: NITROSTAT  PLACE 1 TABLET UNDER THE TONGUE EVERY 5 MINUTES AS NEEDED FOR CHEST AIN CALL 911 AT THIRD DOSE IN 15 MINUTES   OneTouch Verio test strip Generic drug: glucose blood daily.   oxyCODONE -acetaminophen  10-325 MG tablet Commonly known as: PERCOCET Take 1 tablet by mouth every 8 (eight) hours as needed for pain.   pantoprazole  40 MG tablet Commonly known as: PROTONIX  TAKE 1 TABLET(40 MG) BY MOUTH DAILY   phenazopyridine  100 MG tablet Commonly known as: PYRIDIUM  Take 1 tablet (100 mg total) by mouth 3 (three) times daily with  meals.   rosuvastatin  40 MG tablet Commonly known as: CRESTOR  Take 40 mg by mouth every evening.   silodosin  8 MG Caps capsule Commonly known as: RAPAFLO  TAKE 1 CAPSULE BY MOUTH TWICE DAILY   solifenacin  10 MG tablet Commonly known as: VESICARE  TAKE 1 TABLET(10 MG) BY MOUTH DAILY   sotalol  80 MG tablet Commonly known as: BETAPACE  TAKE 1 TABLET(80 MG)  BY MOUTH TWICE DAILY   Vitamin B 12 500 MCG Tabs Take 500 mcg by mouth in the morning.   Vitamin D  (Ergocalciferol ) 1.25 MG (50000 UNIT) Caps capsule Commonly known as: DRISDOL  TAKE 1 CAPSULE BY MOUTH EVERY 7 DAYS        Allergies:  Allergies  Allergen Reactions   Lactose Intolerance (Gi) Other (See Comments)    UPSET STOMACH    Trazodone  And Nefazodone     Nightmares    Family History  Problem Relation Age of Onset   Hypertension Mother        Cerebrovascular disease   Diabetes Mother    Coronary artery disease Father 85   Diabetes type II Father    Hypertension Father    Heart attack Father    Diabetes Brother    Hypertension Brother    Diabetes Sister    Hypertension Sister    Heart attack Sister 24   Leukemia Sister 19       CML   Breast cancer Maternal Grandmother        dx 54s   Lung cancer Maternal Uncle        dx >50, smoker   Cancer Cousin        mouth cancer, dx 70s, no smoking/chew tobacco hx (maternal 1st cousin)   Ovarian cancer Cousin        dx <50 (maternal 1st cousin)   Stomach cancer Cousin        dx 2s (maternal 1st cousin)   Cancer Cousin        type unknown to pt, dx >50 (paternal 1st cousin)    Social History:  reports that he quit smoking about 11 years ago. His smoking use included cigarettes. He started smoking about 51 years ago. He has a 40 pack-year smoking history. He has never used smokeless tobacco. He reports that he does not currently use alcohol. He reports that he does not currently use drugs after having used the following drugs: Cocaine.  ROS: A complete review  of systems was performed.  All systems are negative except for pertinent findings as noted.    Prior GU notes reviewed  Recent ER notes reviewed  Recent urine culture reviewed  I have also reviewed notes from referring/previous physicians--Dr. Charmain last note reviewed  I have independently reviewed prior imaging--bone density study from June 2023 revealed osteopenia  Urinalysis today reveals microscopic hematuria, no evidence of infection

## 2024-01-07 NOTE — Telephone Encounter (Signed)
 Called pt to offer sooner appt pt wife states pt wants to keep scheduled appt 08/12

## 2024-01-07 NOTE — Telephone Encounter (Signed)
 Pt called back stating his symptoms have started back and wanted to be seen tomorrow

## 2024-01-08 ENCOUNTER — Ambulatory Visit (INDEPENDENT_AMBULATORY_CARE_PROVIDER_SITE_OTHER): Admitting: Urology

## 2024-01-08 VITALS — BP 116/73 | HR 69

## 2024-01-08 DIAGNOSIS — N3040 Irradiation cystitis without hematuria: Secondary | ICD-10-CM | POA: Diagnosis not present

## 2024-01-08 DIAGNOSIS — Z79899 Other long term (current) drug therapy: Secondary | ICD-10-CM

## 2024-01-08 DIAGNOSIS — N35911 Unspecified urethral stricture, male, meatal: Secondary | ICD-10-CM

## 2024-01-08 DIAGNOSIS — C61 Malignant neoplasm of prostate: Secondary | ICD-10-CM

## 2024-01-08 DIAGNOSIS — Z191 Hormone sensitive malignancy status: Secondary | ICD-10-CM

## 2024-01-08 DIAGNOSIS — R310 Gross hematuria: Secondary | ICD-10-CM

## 2024-01-08 LAB — URINALYSIS, ROUTINE W REFLEX MICROSCOPIC
Bilirubin, UA: NEGATIVE
Ketones, UA: NEGATIVE
Leukocytes,UA: NEGATIVE
Nitrite, UA: NEGATIVE
Specific Gravity, UA: 1.015 (ref 1.005–1.030)
Urobilinogen, Ur: 1 mg/dL (ref 0.2–1.0)
pH, UA: 6.5 (ref 5.0–7.5)

## 2024-01-08 LAB — MICROSCOPIC EXAMINATION
Bacteria, UA: NONE SEEN
RBC, Urine: >30 /HPF — AB (ref 0–2)

## 2024-01-15 ENCOUNTER — Ambulatory Visit: Admitting: Urology

## 2024-01-17 ENCOUNTER — Other Ambulatory Visit: Payer: Self-pay

## 2024-01-18 ENCOUNTER — Other Ambulatory Visit: Payer: Self-pay

## 2024-01-18 NOTE — Progress Notes (Signed)
 Specialty Pharmacy Refill Coordination Note  Peter Dunlap is a 68 y.o. male contacted today regarding refills of specialty medication(s) Apalutamide  (ERLEADA )   Patient requested Delivery   Delivery date: 01/22/24   Verified address: [address]  [place],  [place] [address]   Medication will be filled on 01/21/24.

## 2024-01-21 ENCOUNTER — Other Ambulatory Visit: Payer: Self-pay

## 2024-01-21 DIAGNOSIS — N401 Enlarged prostate with lower urinary tract symptoms: Secondary | ICD-10-CM

## 2024-01-23 MED ORDER — FINASTERIDE 5 MG PO TABS: 5.0000 mg | ORAL_TABLET | Freq: Every day | ORAL | 3 refills | Status: AC

## 2024-02-06 ENCOUNTER — Ambulatory Visit (INDEPENDENT_AMBULATORY_CARE_PROVIDER_SITE_OTHER): Payer: Medicare Other

## 2024-02-06 DIAGNOSIS — I480 Paroxysmal atrial fibrillation: Secondary | ICD-10-CM | POA: Diagnosis not present

## 2024-02-07 LAB — CUP PACEART REMOTE DEVICE CHECK
Battery Remaining Longevity: 25 mo
Battery Remaining Percentage: 20 %
Battery Voltage: 2.9 V
Brady Statistic AP VP Percent: 4.7 %
Brady Statistic AP VS Percent: 32 %
Brady Statistic AS VP Percent: 1.3 %
Brady Statistic AS VS Percent: 61 %
Brady Statistic RA Percent Paced: 35 %
Brady Statistic RV Percent Paced: 6 %
Date Time Interrogation Session: 20250903020015
Implantable Lead Connection Status: 753985
Implantable Lead Connection Status: 753985
Implantable Lead Implant Date: 20160415
Implantable Lead Implant Date: 20160415
Implantable Lead Location: 753859
Implantable Lead Location: 753860
Implantable Pulse Generator Implant Date: 20160415
Lead Channel Impedance Value: 410 Ohm
Lead Channel Impedance Value: 610 Ohm
Lead Channel Pacing Threshold Amplitude: 0.75 V
Lead Channel Pacing Threshold Amplitude: 0.75 V
Lead Channel Pacing Threshold Pulse Width: 0.5 ms
Lead Channel Pacing Threshold Pulse Width: 0.5 ms
Lead Channel Sensing Intrinsic Amplitude: 12 mV
Lead Channel Sensing Intrinsic Amplitude: 2.7 mV
Lead Channel Setting Pacing Amplitude: 2 V
Lead Channel Setting Pacing Amplitude: 2.5 V
Lead Channel Setting Pacing Pulse Width: 0.5 ms
Lead Channel Setting Sensing Sensitivity: 2 mV
Pulse Gen Model: 2240
Pulse Gen Serial Number: 7756161

## 2024-02-10 ENCOUNTER — Ambulatory Visit: Payer: Self-pay | Admitting: Internal Medicine

## 2024-02-13 ENCOUNTER — Other Ambulatory Visit: Payer: Self-pay | Admitting: Oncology

## 2024-02-13 ENCOUNTER — Other Ambulatory Visit (HOSPITAL_COMMUNITY): Payer: Self-pay

## 2024-02-13 ENCOUNTER — Other Ambulatory Visit: Payer: Self-pay

## 2024-02-13 MED ORDER — APALUTAMIDE 60 MG PO TABS
ORAL_TABLET | ORAL | 3 refills | Status: DC
  Filled 2024-02-13: qty 120, fill #0
  Filled 2024-02-15: qty 120, 30d supply, fill #0
  Filled 2024-03-18 – 2024-03-20 (×2): qty 120, 30d supply, fill #1
  Filled 2024-04-16: qty 120, 30d supply, fill #2
  Filled 2024-05-15: qty 120, 30d supply, fill #3

## 2024-02-15 ENCOUNTER — Other Ambulatory Visit: Payer: Self-pay | Admitting: Pharmacy Technician

## 2024-02-15 ENCOUNTER — Other Ambulatory Visit: Payer: Self-pay

## 2024-02-15 NOTE — Progress Notes (Signed)
 Specialty Pharmacy Refill Coordination Note  Peter Dunlap is a 68 y.o. male contacted today regarding refills of specialty medication(s) Apalutamide  (ERLEADA )  Spoke with Wife  Patient requested Delivery   Delivery date: 02/22/24   Verified address: [name] [place]   Medication will be filled on 02/21/24.

## 2024-02-16 NOTE — Progress Notes (Signed)
 Remote PPM Transmission

## 2024-02-19 ENCOUNTER — Inpatient Hospital Stay: Attending: Hematology

## 2024-02-19 DIAGNOSIS — Z801 Family history of malignant neoplasm of trachea, bronchus and lung: Secondary | ICD-10-CM | POA: Insufficient documentation

## 2024-02-19 DIAGNOSIS — C61 Malignant neoplasm of prostate: Secondary | ICD-10-CM | POA: Insufficient documentation

## 2024-02-19 DIAGNOSIS — Z806 Family history of leukemia: Secondary | ICD-10-CM | POA: Diagnosis not present

## 2024-02-19 DIAGNOSIS — Z803 Family history of malignant neoplasm of breast: Secondary | ICD-10-CM | POA: Insufficient documentation

## 2024-02-19 DIAGNOSIS — D696 Thrombocytopenia, unspecified: Secondary | ICD-10-CM | POA: Diagnosis not present

## 2024-02-19 DIAGNOSIS — Z191 Hormone sensitive malignancy status: Secondary | ICD-10-CM

## 2024-02-19 DIAGNOSIS — D649 Anemia, unspecified: Secondary | ICD-10-CM | POA: Diagnosis not present

## 2024-02-19 DIAGNOSIS — Z8042 Family history of malignant neoplasm of prostate: Secondary | ICD-10-CM | POA: Diagnosis not present

## 2024-02-19 LAB — CBC WITH DIFFERENTIAL/PLATELET
Abs Immature Granulocytes: 0.02 K/uL (ref 0.00–0.07)
Basophils Absolute: 0.1 K/uL (ref 0.0–0.1)
Basophils Relative: 1 %
Eosinophils Absolute: 0.1 K/uL (ref 0.0–0.5)
Eosinophils Relative: 1 %
HCT: 40.3 % (ref 39.0–52.0)
Hemoglobin: 13.3 g/dL (ref 13.0–17.0)
Immature Granulocytes: 0 %
Lymphocytes Relative: 24 %
Lymphs Abs: 1.4 K/uL (ref 0.7–4.0)
MCH: 33.1 pg (ref 26.0–34.0)
MCHC: 33 g/dL (ref 30.0–36.0)
MCV: 100.2 fL — ABNORMAL HIGH (ref 80.0–100.0)
Monocytes Absolute: 0.6 K/uL (ref 0.1–1.0)
Monocytes Relative: 11 %
Neutro Abs: 3.5 K/uL (ref 1.7–7.7)
Neutrophils Relative %: 63 %
Platelets: 123 K/uL — ABNORMAL LOW (ref 150–400)
RBC: 4.02 MIL/uL — ABNORMAL LOW (ref 4.22–5.81)
RDW: 14.7 % (ref 11.5–15.5)
WBC: 5.6 K/uL (ref 4.0–10.5)
nRBC: 0 % (ref 0.0–0.2)

## 2024-02-19 LAB — COMPREHENSIVE METABOLIC PANEL WITH GFR
ALT: 14 U/L (ref 0–44)
AST: 31 U/L (ref 15–41)
Albumin: 3.8 g/dL (ref 3.5–5.0)
Alkaline Phosphatase: 100 U/L (ref 38–126)
Anion gap: 9 (ref 5–15)
BUN: 12 mg/dL (ref 8–23)
CO2: 22 mmol/L (ref 22–32)
Calcium: 8.9 mg/dL (ref 8.9–10.3)
Chloride: 109 mmol/L (ref 98–111)
Creatinine, Ser: 1.06 mg/dL (ref 0.61–1.24)
GFR, Estimated: >60 mL/min (ref >60)
Glucose, Bld: 138 mg/dL — ABNORMAL HIGH (ref 70–99)
Potassium: 4 mmol/L (ref 3.5–5.1)
Sodium: 140 mmol/L (ref 135–145)
Total Bilirubin: 0.8 mg/dL (ref 0.0–1.2)
Total Protein: 7.4 g/dL (ref 6.5–8.1)

## 2024-02-19 LAB — IRON AND TIBC
Iron: 65 ug/dL (ref 45–182)
Saturation Ratios: 17 % — ABNORMAL LOW (ref 17.9–39.5)
TIBC: 379 ug/dL (ref 250–450)
UIBC: 314 ug/dL

## 2024-02-19 LAB — FERRITIN: Ferritin: 67 ng/mL (ref 24–336)

## 2024-02-19 LAB — PSA: Prostatic Specific Antigen: <0.02 ng/mL (ref 0.00–4.00)

## 2024-02-25 NOTE — Progress Notes (Unsigned)
 Impression/Assessment:  1.  Castrate sensitive metastatic prostate cancer, on leuprolide , apalutamide  with excellent PSA response that is durable.  Most recent PSA close to 0  2.  Osteopenia, on Prolia.  Last bone density study was over 2 years ago  3.  BPH with lower urinary tract symptoms, on silodosin , Gemtesa and Solifenacin , still doing well, emptying well.  4.  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  Recent recurrence.  He seems to be emptying well, urine clear today  5.  Meatal stenosis, voiding adequately  Plan: 1.  He will stay off aspirin  for another whole week  2.  I let him know that if he has recurrences of this hematuria we should proceed with HBO  3.  I will have him come back in 6 weeks to recheck  History of Present Illness: Peter Dunlap is here today for follow-up of prostate cancer and BPH w/ LUTS:    Prostate cancer:    He underwent TRUS/Bx on 8.15.2017. At that time, PSA was 9.09. Prostatic volume was 25 cc. 10/12 cores came back positive for adenocarcinoma as follows: 1 core revealed GS 3+3 5 cores revealed GS 3+4 4 cores revealed GS 4+3   He completed IMRT, 40 sessions, on 1.10.2018   8.24.2021: Repeat TRUS/Bx for elevating PSA trend following definitive Rx. 1/12 cores-from the left mid lateral region-came back positive for adenocarcinoma.  Grading was difficult due to his prior radiation.     10.15.2021: -CT A/P  with newly enlarged left iliac lymph nodes measuring 1.1 x 0.9 cm.  Hepatic steatosis with early signs of cirrhosis.   04/12/2020 -F-18 PSMA PET scan on showed radiotracer activity associated with enlarged left external iliac lymph node 9 mm with SUV 7.6.  Small left common iliac lymph node 6 mm, SUV 8.6.  Left periaortic lymph node 5 mm, SUV 6.3.  Lymph node between IVC and aorta at the level of right renal hilum, SUV 8.6.  Lymph node deep to the IVC measuring 6 mm, SUV 5.4.  No skeletal metastasis.   11.17.2021: Began ADT  with Firmagon  and Erleada .   7.5.2022: Continues Firmagon  and Erleada   Most recent PSA is less than 0.1.  Testosterone  level 5.   11.9.2022: PSA < 0.01. T level castrate.  Continues Firmagon  and Erleada    7.18.2023: PSA is 0.01, testosterone  level still castrate.  Continues on Erleada  and Firmagon .    11.21.2023: Most recent PSA in September less than 0.01, nadir level for him.  He did experience hematuria in September-passed a clot,   5.21.2024: PSA 2 months ago was still undetectable.  Still with urinary frequency, urgency and urgency incontinence as well as nocturia.  On Rapaflo  twice a day as well as Solifenacin  10 mg.   8.7.2024:  He was switched to leuprolide  45 mg from his monthly Firmagon  on the 18th of last month.  He is much more comfortable with this.  Most recent PSA was undetectable.  At his last visit he was started on Gemtesa in addition to his Solifenacin .    11.11.2024: He follows regularly with Dr. Rogers.  He is on leuprolide  45 mg every 6 months, last given in July.  Also on apalutamide  and Prolia.  Most recent PSA 2 months ago is less than 0.1.  He is having no hot flashes.  He takes vitamin D  supplements.  He does get some exercise-walking on a regular basis.  No bony pain.     BPH   11.12.2024: He denies significant lower urinary  tract symptoms.  He is having no blood in his urine.  He is on Solifenacin  and alfuzosin   Gross hematuria:  He underwent cystoscopy, bladder biopsy and fulguration of bleeders on 06/21/2023.  Bladder biopsies revealed benign findings.  He has not noticed any blood in his urine since the procedure.  He is having spraying with urination.  He is having worsening urgency incontinence.    7.15.2025: He is voiding well.  He has no gross hematuria.  Spraying of stream is still present but less than several months ago.  He feels like he is emptying adequately.  He has not experienced gross hematuria.  8.5.2025: He comes in today for follow-up.   On the first of this month he began with recurrent gross painless hematuria.  Went to the urgent care then the emergency room.  Urine culture was negative, he was placed on antibiotics.  Temporarily taken off of aspirin  which he has restarted.  Hematuria has cleared.  9.23.2025:   Past Surgical History:  Procedure Laterality Date   BRAIN SURGERY  2015   hematoma evacuation   BURR HOLE Right 04/13/2014   Procedure: SOLMON HERRLICH;  Surgeon: Victory DELENA Gunnels, MD;  Location: [facility];  Service: Neurosurgery;  Laterality: Right;   CAROTID ENDARTERECTOMY Right Feb. 25, 2010    CEA   COLONOSCOPY WITH PROPOFOL  N/A 06/24/2021   Procedure: COLONOSCOPY WITH PROPOFOL ;  Surgeon: Rollin Dover, MD;  Location: [facility];  Service: Endoscopy;  Laterality: N/A;   CORONARY ANGIOPLASTY WITH STENT PLACEMENT  12/03/2013   LAD 90%-->0% W/ Promus Premier DES 3.0 mm x 16 mm, CFX OK, RCA 40%, EF 70-75%   CYSTOSCOPY WITH FULGERATION N/A 06/21/2023   Procedure: CYSTOSCOPY WITH FULGERATION and clot evacuation;  Surgeon: Matilda Senior, MD;  Location: [facility];  Service: Urology;  Laterality: N/A;   LEFT ATRIAL APPENDAGE OCCLUSION N/A 08/05/2015   Procedure: LEFT ATRIAL APPENDAGE OCCLUSION;  Surgeon: Lynwood Rakers, MD;  Location: [facility];  Service: Cardiovascular;  Laterality: N/A;   LEFT HEART CATH AND CORONARY ANGIOGRAPHY N/A 09/13/2021   Procedure: LEFT HEART CATH AND CORONARY ANGIOGRAPHY;  Surgeon: Wendel Lurena POUR, MD;  Location: [facility];  Service: Cardiovascular;  Laterality: N/A;   LEFT HEART CATHETERIZATION WITH CORONARY ANGIOGRAM Left 12/03/2013   Procedure: LEFT HEART CATHETERIZATION WITH CORONARY ANGIOGRAM;  Surgeon: Alm LELON Clay, MD;  Location: [facility] CATH LAB;  Service: Cardiovascular;  Laterality: Left;   LEFT HEART CATHETERIZATION WITH CORONARY ANGIOGRAM N/A 01/26/2014   Procedure: LEFT HEART CATHETERIZATION WITH CORONARY ANGIOGRAM;  Surgeon: Candyce GORMAN Reek, MD;   Location: [facility] CATH LAB;  Service: Cardiovascular;  Laterality: N/A;   LEFT HEART CATHETERIZATION WITH CORONARY ANGIOGRAM N/A 08/03/2014   Procedure: LEFT HEART CATHETERIZATION WITH CORONARY ANGIOGRAM;  Surgeon: Lonni JONETTA Cash, MD;  Location: [facility] CATH LAB;  Service: Cardiovascular;  Laterality: N/A;   PERCUTANEOUS CORONARY STENT INTERVENTION (PCI-S)  12/03/2013   Procedure: PERCUTANEOUS CORONARY STENT INTERVENTION (PCI-S);  Surgeon: Alm LELON Clay, MD;  Location: [facility] CATH LAB;  Service: Cardiovascular;;   PERMANENT PACEMAKER INSERTION N/A 09/18/2014   Procedure: PERMANENT PACEMAKER INSERTION;  Surgeon: Danelle LELON Birmingham, MD;  Location: [facility] CATH LAB;  Service: Cardiovascular;  Laterality: N/A;   POLYPECTOMY  06/24/2021   Procedure: POLYPECTOMY;  Surgeon: Rollin Dover, MD;  Location: [facility];  Service: Endoscopy;;   RADIOFREQUENCY ABLATION  2005   for PSVT   TEE WITHOUT CARDIOVERSION N/A 07/27/2015   Procedure: TRANSESOPHAGEAL ECHOCARDIOGRAM (TEE);  Surgeon: Redell GORMAN Shallow, MD;  Location: [facility] ENDOSCOPY;  Service: Cardiovascular;  Laterality: N/A;   TEE WITHOUT CARDIOVERSION N/A 09/15/2015   Procedure: TRANSESOPHAGEAL ECHOCARDIOGRAM (TEE);  Surgeon: Aleene JINNY Passe, MD;  Location: [facility] ENDOSCOPY;  Service: Cardiovascular;  Laterality: N/A;    Home Medications:  Allergies as of 02/26/2024       Reactions   Lactose Intolerance (gi) Other (See Comments)   UPSET STOMACH   Trazodone  And Nefazodone    Nightmares        Medication List        Accurate as of February 25, 2024  7:12 PM. If you have any questions, ask your nurse or doctor.          acetaminophen  500 MG tablet Commonly known as: TYLENOL  Take 500-1,000 mg by mouth every 6 (six) hours as needed for moderate pain (pain score 4-6).   allopurinol  100 MG tablet Commonly known as: ZYLOPRIM  TAKE 1 TABLET(100 MG) BY MOUTH DAILY   cephALEXin  500 MG capsule Commonly known as: KEFLEX  Take 1 capsule (500 mg total) by mouth 2  (two) times daily.   Erleada  60 MG tablet Generic drug: apalutamide  TAKE 4 TABLETS (240 MG TOTAL) BY MOUTH DAILY. MAY BE TAKEN WITH OR WITHOUT FOOD. SWALLOW TABLETS WHOLE.   Farxiga  5 MG Tabs tablet Generic drug: dapagliflozin  propanediol Take 1 tablet (5 mg total) by mouth daily.   finasteride  5 MG tablet Commonly known as: PROSCAR  Take 1 tablet (5 mg total) by mouth daily.   fluticasone  50 MCG/ACT nasal spray Commonly known as: FLONASE  Place 2 sprays into both nostrils daily.   Gemtesa 75 MG Tabs Generic drug: Vibegron Take 75 mg by mouth every Monday, Wednesday, and Friday.   isosorbide  mononitrate 30 MG 24 hr tablet Commonly known as: IMDUR  TAKE 1 TABLET(30 MG) BY MOUTH DAILY   levothyroxine  25 MCG tablet Commonly known as: SYNTHROID  Take 1 tablet (25 mcg total) by mouth See admin instructions. Takes along with 200 mcg to total 225 mcg daily   levothyroxine  200 MCG tablet Commonly known as: SYNTHROID  Take 1 tablet (200 mcg total) by mouth daily before breakfast.   Linzess 145 MCG Caps capsule Generic drug: linaclotide Take 145 mcg by mouth every morning.   loratadine  10 MG tablet Commonly known as: CLARITIN  TAKE 1 TABLET(10 MG) BY MOUTH DAILY AS NEEDED FOR ALLERGIES   naloxone 4 MG/0.1ML Liqd nasal spray kit Commonly known as: NARCAN Place 4 mg into the nose once as needed (accidental overdose).   nitroGLYCERIN  0.4 MG SL tablet Commonly known as: NITROSTAT  PLACE 1 TABLET UNDER THE TONGUE EVERY 5 MINUTES AS NEEDED FOR CHEST AIN CALL 911 AT THIRD DOSE IN 15 MINUTES   OneTouch Verio test strip Generic drug: glucose blood daily.   oxyCODONE -acetaminophen  10-325 MG tablet Commonly known as: PERCOCET Take 1 tablet by mouth every 8 (eight) hours as needed for pain.   pantoprazole  40 MG tablet Commonly known as: PROTONIX  TAKE 1 TABLET(40 MG) BY MOUTH DAILY   phenazopyridine  100 MG tablet Commonly known as: PYRIDIUM  Take 1 tablet (100 mg total) by mouth 3  (three) times daily with meals.   rosuvastatin  40 MG tablet Commonly known as: CRESTOR  Take 40 mg by mouth every evening.   silodosin  8 MG Caps capsule Commonly known as: RAPAFLO  TAKE 1 CAPSULE BY MOUTH TWICE DAILY   solifenacin  10 MG tablet Commonly known as: VESICARE  TAKE 1 TABLET(10 MG) BY MOUTH DAILY   sotalol  80 MG tablet Commonly known as: BETAPACE   Take 1 tablet (80 mg total) by mouth 2 (two) times daily. NEED OV.   Vitamin B 12 500 MCG Tabs Take 500 mcg by mouth in the morning.   Vitamin D  (Ergocalciferol ) 1.25 MG (50000 UNIT) Caps capsule Commonly known as: DRISDOL  TAKE 1 CAPSULE BY MOUTH EVERY 7 DAYS        Allergies:  Allergies  Allergen Reactions   Lactose Intolerance (Gi) Other (See Comments)    UPSET STOMACH    Trazodone  And Nefazodone     Nightmares    Family History  Problem Relation Age of Onset   Hypertension Mother        Cerebrovascular disease   Diabetes Mother    Coronary artery disease Father 5   Diabetes type II Father    Hypertension Father    Heart attack Father    Diabetes Brother    Hypertension Brother    Diabetes Sister    Hypertension Sister    Heart attack Sister 70   Leukemia Sister 88       CML   Breast cancer Maternal Grandmother        dx 58s   Lung cancer Maternal Uncle        dx >50, smoker   Cancer Cousin        mouth cancer, dx 70s, no smoking/chew tobacco hx (maternal 1st cousin)   Ovarian cancer Cousin        dx <50 (maternal 1st cousin)   Stomach cancer Cousin        dx 12s (maternal 1st cousin)   Cancer Cousin        type unknown to pt, dx >50 (paternal 1st cousin)    Social History:  reports that he quit smoking about 11 years ago. His smoking use included cigarettes. He started smoking about 51 years ago. He has a 40 pack-year smoking history. He has never used smokeless tobacco. He reports that he does not currently use alcohol. He reports that he does not currently use drugs after having used the  following drugs: Cocaine.  ROS: A complete review of systems was performed.  All systems are negative except for pertinent findings as noted.    Prior GU notes reviewed  Recent ER notes reviewed  Recent urine culture reviewed  I have also reviewed notes from referring/previous physicians--Dr. Charmain last note reviewed  I have independently reviewed prior imaging--bone density study from June 2023 revealed osteopenia  Urinalysis today reveals microscopic hematuria, no evidence of infection

## 2024-02-26 ENCOUNTER — Inpatient Hospital Stay (HOSPITAL_BASED_OUTPATIENT_CLINIC_OR_DEPARTMENT_OTHER): Admitting: Oncology

## 2024-02-26 ENCOUNTER — Ambulatory Visit (INDEPENDENT_AMBULATORY_CARE_PROVIDER_SITE_OTHER): Admitting: Urology

## 2024-02-26 ENCOUNTER — Other Ambulatory Visit: Payer: Self-pay | Admitting: Internal Medicine

## 2024-02-26 VITALS — BP 112/72 | HR 61 | Temp 97.5°F | Resp 20 | Wt 284.4 lb

## 2024-02-26 VITALS — BP 110/70 | HR 71

## 2024-02-26 DIAGNOSIS — Z79899 Other long term (current) drug therapy: Secondary | ICD-10-CM

## 2024-02-26 DIAGNOSIS — Z191 Hormone sensitive malignancy status: Secondary | ICD-10-CM

## 2024-02-26 DIAGNOSIS — N3040 Irradiation cystitis without hematuria: Secondary | ICD-10-CM

## 2024-02-26 DIAGNOSIS — D696 Thrombocytopenia, unspecified: Secondary | ICD-10-CM | POA: Diagnosis not present

## 2024-02-26 DIAGNOSIS — N401 Enlarged prostate with lower urinary tract symptoms: Secondary | ICD-10-CM | POA: Diagnosis not present

## 2024-02-26 DIAGNOSIS — M8580 Other specified disorders of bone density and structure, unspecified site: Secondary | ICD-10-CM | POA: Diagnosis not present

## 2024-02-26 DIAGNOSIS — C61 Malignant neoplasm of prostate: Secondary | ICD-10-CM

## 2024-02-26 DIAGNOSIS — R310 Gross hematuria: Secondary | ICD-10-CM

## 2024-02-26 DIAGNOSIS — D649 Anemia, unspecified: Secondary | ICD-10-CM | POA: Insufficient documentation

## 2024-02-26 LAB — URINALYSIS, ROUTINE W REFLEX MICROSCOPIC
Bilirubin, UA: NEGATIVE
Ketones, UA: NEGATIVE
Leukocytes,UA: NEGATIVE
Nitrite, UA: NEGATIVE
Specific Gravity, UA: 1.02 (ref 1.005–1.030)
Urobilinogen, Ur: 1 mg/dL (ref 0.2–1.0)
pH, UA: 6 (ref 5.0–7.5)

## 2024-02-26 LAB — MICROSCOPIC EXAMINATION
Bacteria, UA: NONE SEEN
WBC, UA: NONE SEEN /HPF (ref 0–5)

## 2024-02-26 NOTE — Assessment & Plan Note (Addendum)
-   Mild thrombocytopenia on and off since December 2020 stable. -Platelet count 123. -He has had mild hematuria on and off.

## 2024-02-26 NOTE — Assessment & Plan Note (Addendum)
-   Received INFeD  on 12/03/2023 1 g.  Tolerated well. -Repeat lab work from 02/19/2024 showed improvement of his hemoglobin to 13.3, ferritin 67 and iron  saturation of 17%. -No additional iron  needed. -Etiology likely due to hematuria.  He is followed by urology and sees them today. -Will recheck these labs in 3 months.

## 2024-02-26 NOTE — Assessment & Plan Note (Addendum)
-   He is tolerating apalutamide  reasonably well.  Last Eligard  45 mg injection on 12/03/2023. - reviewed labs from 02/19/2024. PSA <0.023. Eligard  due every 6 months next due on 06/03/2024.  Continue Erleada  240mg  daily to progression. RTC 3 months w/ repeat labs, PSA and Eligard  injection.

## 2024-02-26 NOTE — Progress Notes (Signed)
 [facility] OFFICE PROGRESS NOTE  Shelda Atlas, MD  ASSESSMENT & PLAN:    Assessment & Plan Metastatic castration-sensitive adenocarcinoma of prostate [facility]) - He is tolerating apalutamide  reasonably well.  Last Eligard  45 mg injection on 12/03/2023. - reviewed labs from 02/19/2024. PSA <0.023. Eligard  due every 6 months next due on 06/03/2024.  Continue Erleada  240mg  daily to progression. RTC 3 months w/ repeat labs, PSA and Eligard  injection. Normocytic anemia - Received INFeD  on 12/03/2023 1 g.  Tolerated well. -Repeat lab work from 02/19/2024 showed improvement of his hemoglobin to 13.3, ferritin 67 and iron  saturation of 17%. -No additional iron  needed. -Etiology likely due to hematuria.  He is followed by urology and sees them today. -Will recheck these labs in 3 months. Thrombocytopenia - Mild thrombocytopenia on and off since December 2020 stable. -Platelet count 123. -He has had mild hematuria on and off. Osteopenia, unspecified location - Continue vitamin D  50,000 units every other week. - He had dental extractions 2 months ago.  -He has not started bisphosphonate therapy yet.  Looks like he has been approved for Zometa .  We will start this at his next visit.  Orders Placed This Encounter  Procedures   CBC with Differential    Standing Status:   Future    Expected Date:   05/27/2024    Expiration Date:   08/25/2024   Comprehensive metabolic panel    Standing Status:   Future    Expected Date:   05/27/2024    Expiration Date:   08/25/2024   PSA    Standing Status:   Future    Expected Date:   05/27/2024    Expiration Date:   08/25/2024   Iron  and TIBC ([facility] ONLY)    Standing Status:   Future    Expected Date:   05/27/2024    Expiration Date:   08/25/2024   Ferritin    Standing Status:   Future    Expected Date:   05/27/2024    Expiration Date:   08/25/2024    INTERVAL HISTORY: Patient returns for follow-up.  He last received  Eligard  on 12/03/2023.  He is currently on apalutamide  with good tolerance.  He received 1 g INFeD  on 12/03/2023 as well.  He is here to review most recent lab results.  Denies any interval hospitalizations, surgeries or changes to his baseline health.  He was seen for cystitis with gross hematuria on 01/04/2024 and treated with Bactrim .  He presented back to the next day for hematuria and Keflex  was added.  Reports he has on and off hematuria over the past 6 months or so.  He is scheduled to see urology today but he forgot about this appointment.  Reports 2 nights ago having several clots when he was using the bathroom.  No gross hematuria.  Occasional burning with urination.  No bright red blood per rectum, melena or hematochezia.  We reviewed ferritin, iron  panel, PSA, CMP and CBC.  SUMMARY OF HEMATOLOGIC HISTORY: Oncology History Overview Note  1.  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 -Status post IMRT, 40 sessions completed on 06/14/2016 by Dr. Patrcia. -PSA 0.7 (10/15/2018), 0.8 (02/18/2019), 1.4 (December 2020), 1.4 (June 2021) -Prostate biopsy on 02/10/2020-1 out of 12 cores from left mid lateral region positive for adenocarcinoma. -CTAP on 03/09/2020 with newly enlarged left iliac lymph nodes measuring 1.1  x 0.9 cm.  Hepatic steatosis with early signs of cirrhosis. -F-18 PSMA PET scan on 04/12/2020 showed radiotracer activity associated with enlarged left external iliac lymph node 9 mm with SUV 7.6.  Small left common iliac lymph node 6 mm, SUV 8.6.  Left periaortic lymph node 5 mm, SUV 6.3.  Lymph node between IVC and aorta at the level of right renal hilum, SUV 8.6.  Lymph node deep to the IVC measuring 6 mm, SUV 5.4.  No skeletal metastasis. 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 - Firmagon  started on 04/21/2020 -  Apalutamide  240 milligrams daily started around 04/22/2020.   2.  Social/family history: -He is a retired Corporate investment banker.  He lives at home with his wife.  He plays golf.  He quit smoking in 2015, smoked 1 pack/day for more than 20 years. -Sister has CML.  Maternal grandmother had breast cancer.  Maternal uncle had lung cancer.  2 maternal cousins had tongue cancer and uterine cancer.   3.  Cirrhosis: -Prior imaging showed cirrhosis of the liver with normal spleen.   Prostate cancer (HCC)  03/10/2016 Initial Diagnosis   Prostate cancer (HCC)   05/23/2020 Genetic Testing   Negative genetic testing:  No pathogenic variants detected on the [facility] Common Hereditary Cancers Panel + Prostate Cancer HRR Panel. A variant of uncertain significance (VUS) was detected in the NF1 gene called c.1178A>G. The report date is 05/23/2020.  The Common Hereditary Cancers Panel offered by [facility] includes sequencing and/or deletion duplication testing of the following 47 genes: APC, ATM, AXIN2, BARD1, BMPR1A, BRCA1, BRCA2, BRIP1, CDH1, CDK4, CDKN2A (p14ARF), CDKN2A (p16INK4a), CHEK2, CTNNA1, DICER1, EPCAM (Deletion/duplication testing only), GREM1 (promoter region deletion/duplication testing only), KIT, MEN1, MLH1, MSH2, MSH3, MSH6, MUTYH, NBN, NF1, NTHL1, PALB2, PDGFRA, PMS2, POLD1, POLE, PTEN, RAD50, RAD51C, RAD51D, SDHB, SDHC, SDHD, SMAD4, SMARCA4. STK11, TP53, TSC1, TSC2, and VHL.  The following genes were evaluated for sequence changes only: SDHA and HOXB13 c.251G>A variant only. The Prostate Cancer HRR Panel offered by [facility] includes sequencing and/or deletion duplication analysis of the following 10 genes: ATM, BARD1, BRCA1, BRCA2, BRIP1, CHEK2, FANCL, PALB2, RAD51C, RAD51D.      CBC    Component Value Date/Time   WBC 5.6 02/19/2024 1257   RBC 4.02 (L) 02/19/2024 1257   HGB 13.3 02/19/2024 1257   HGB 15.8 04/17/2016 1153   HCT 40.3 02/19/2024 1257   HCT 46.1 04/17/2016 1153   PLT 123 (L)  02/19/2024 1257   PLT 231 04/17/2016 1153   MCV 100.2 (H) 02/19/2024 1257   MCV 93.3 04/17/2016 1153   MCH 33.1 02/19/2024 1257   MCHC 33.0 02/19/2024 1257   RDW 14.7 02/19/2024 1257   RDW 14.0 04/17/2016 1153   LYMPHSABS 1.4 02/19/2024 1257   LYMPHSABS 2.5 04/17/2016 1153   MONOABS 0.6 02/19/2024 1257   MONOABS 2.7 (H) 04/17/2016 1153   EOSABS 0.1 02/19/2024 1257   EOSABS 0.0 04/17/2016 1153   BASOSABS 0.1 02/19/2024 1257   BASOSABS 0.1 04/17/2016 1153       Latest Ref Rng & Units 02/19/2024   12:57 PM 01/05/2024    8:16 AM 11/14/2023    2:14 PM  CMP  Glucose 70 - 99 mg/dL 861  854  868   BUN 8 - 23 mg/dL 12  11  12    Creatinine 0.61 - 1.24 mg/dL 8.93  8.93  8.77   Sodium 135 - 145 mmol/L 140  138  139   Potassium 3.5 - 5.1 mmol/L 4.0  4.1  4.1   Chloride 98 - 111 mmol/L 109  108  109   CO2 22 - 32 mmol/L 22  20  20    Calcium  8.9 - 10.3 mg/dL 8.9  8.6  9.7   Total Protein 6.5 - 8.1 g/dL 7.4   7.6   Total Bilirubin 0.0 - 1.2 mg/dL 0.8   0.6   Alkaline Phos 38 - 126 U/L 100   92   AST 15 - 41 U/L 31   22   ALT 0 - 44 U/L 14   10      Lab Results  Component Value Date   FERRITIN 67 02/19/2024   VITAMINB12 1,525 (H) 12/18/2022    Vitals:   02/26/24 1301  BP: 112/72  Pulse: 61  Resp: 20  Temp: (!) 97.5 F (36.4 C)  SpO2: 100%    Review of System:  Review of Systems  Constitutional:  Positive for malaise/fatigue.  Gastrointestinal:  Positive for constipation.  Genitourinary:  Positive for hematuria.  Neurological:  Positive for dizziness, tingling and headaches.  Psychiatric/Behavioral:  The patient has insomnia.     Physical Exam: Physical Exam Constitutional:      Appearance: Normal appearance. He is obese.  HENT:     Head: Normocephalic and atraumatic.  Eyes:     Pupils: Pupils are equal, round, and reactive to light.  Cardiovascular:     Rate and Rhythm: Normal rate and regular rhythm.     Heart sounds: Normal heart sounds. No murmur  heard. Pulmonary:     Effort: Pulmonary effort is normal.     Breath sounds: Normal breath sounds. No wheezing.  Abdominal:     General: Bowel sounds are normal. There is no distension.     Palpations: Abdomen is soft.     Tenderness: There is no abdominal tenderness.  Musculoskeletal:        General: Normal range of motion.     Cervical back: Normal range of motion.  Skin:    General: Skin is warm and dry.     Findings: No rash.  Neurological:     Mental Status: He is alert and oriented to person, place, and time.     Gait: Gait is intact.  Psychiatric:        Mood and Affect: Mood and affect normal.        Cognition and Memory: Memory normal.        Judgment: Judgment normal.      I spent 20 minutes dedicated to the care of this patient (face-to-face and non-face-to-face) on the date of the encounter to include what is described in the assessment and plan.,  Peter Hope, NP 02/26/2024 1:31 PM

## 2024-02-26 NOTE — Assessment & Plan Note (Addendum)
-   Continue vitamin D  50,000 units every other week. - He had dental extractions 2 months ago.  -He has not started bisphosphonate therapy yet.  Looks like he has been approved for Zometa .  We will start this at his next visit.

## 2024-03-07 ENCOUNTER — Telehealth: Payer: Self-pay | Admitting: Internal Medicine

## 2024-03-07 ENCOUNTER — Other Ambulatory Visit: Payer: Self-pay | Admitting: Internal Medicine

## 2024-03-07 MED ORDER — ISOSORBIDE MONONITRATE ER 30 MG PO TB24: 30.0000 mg | ORAL_TABLET | Freq: Every day | ORAL | 0 refills | Status: DC

## 2024-03-07 NOTE — Telephone Encounter (Signed)
 RX sent in

## 2024-03-07 NOTE — Telephone Encounter (Signed)
*  STAT* If patient is at the pharmacy, call can be transferred to refill team.   1. Which medications need to be refilled? (please list name of each medication and dose if known)   isosorbide  mononitrate (IMDUR ) 30 MG 24 hr tablet   2. Would you like to learn more about the convenience, safety, & potential cost savings by using the [facility]?   3. Are you open to using the [facility] (Type [facility]. ).  4. Which pharmacy/location (including street and city if local pharmacy) is medication to be sent to?  [id number] - [place], [place] - [address] AT [address]   5. Do they need a 30 day or 90 day supply?   Wife Susette) stated patient is almost out of this medication.  Patient has appointment with Dr. Waddell on 10/17.

## 2024-03-08 ENCOUNTER — Other Ambulatory Visit: Payer: Self-pay | Admitting: Internal Medicine

## 2024-03-13 ENCOUNTER — Encounter (HOSPITAL_BASED_OUTPATIENT_CLINIC_OR_DEPARTMENT_OTHER): Attending: Internal Medicine | Admitting: Internal Medicine

## 2024-03-13 DIAGNOSIS — L598 Other specified disorders of the skin and subcutaneous tissue related to radiation: Secondary | ICD-10-CM | POA: Diagnosis not present

## 2024-03-13 DIAGNOSIS — N3041 Irradiation cystitis with hematuria: Secondary | ICD-10-CM | POA: Insufficient documentation

## 2024-03-13 DIAGNOSIS — Z8546 Personal history of malignant neoplasm of prostate: Secondary | ICD-10-CM | POA: Insufficient documentation

## 2024-03-16 ENCOUNTER — Encounter (HOSPITAL_BASED_OUTPATIENT_CLINIC_OR_DEPARTMENT_OTHER): Payer: Self-pay | Admitting: Internal Medicine

## 2024-03-16 ENCOUNTER — Other Ambulatory Visit (HOSPITAL_BASED_OUTPATIENT_CLINIC_OR_DEPARTMENT_OTHER): Payer: Self-pay | Admitting: Internal Medicine

## 2024-03-16 DIAGNOSIS — L598 Other specified disorders of the skin and subcutaneous tissue related to radiation: Secondary | ICD-10-CM

## 2024-03-16 DIAGNOSIS — Z923 Personal history of irradiation: Secondary | ICD-10-CM

## 2024-03-16 DIAGNOSIS — Z9189 Other specified personal risk factors, not elsewhere classified: Secondary | ICD-10-CM

## 2024-03-16 DIAGNOSIS — Z01818 Encounter for other preprocedural examination: Secondary | ICD-10-CM

## 2024-03-16 DIAGNOSIS — Z8546 Personal history of malignant neoplasm of prostate: Secondary | ICD-10-CM

## 2024-03-16 DIAGNOSIS — N3041 Irradiation cystitis with hematuria: Secondary | ICD-10-CM

## 2024-03-18 ENCOUNTER — Other Ambulatory Visit: Payer: Self-pay

## 2024-03-18 ENCOUNTER — Other Ambulatory Visit (HOSPITAL_COMMUNITY): Payer: Self-pay

## 2024-03-18 DIAGNOSIS — I6523 Occlusion and stenosis of bilateral carotid arteries: Secondary | ICD-10-CM

## 2024-03-19 ENCOUNTER — Encounter (HOSPITAL_BASED_OUTPATIENT_CLINIC_OR_DEPARTMENT_OTHER): Payer: Self-pay | Admitting: Internal Medicine

## 2024-03-20 ENCOUNTER — Other Ambulatory Visit: Payer: Self-pay

## 2024-03-20 NOTE — Progress Notes (Signed)
 Specialty Pharmacy Refill Coordination Note  Spoke to patient's wife, Peter Dunlap is a 68 y.o. male contacted today regarding refills of specialty medication(s) Apalutamide  (ERLEADA )   Patient requested Delivery   Delivery date: 03/25/24   Verified address: [name] [place]   Medication will be filled on 03/24/24.

## 2024-03-21 ENCOUNTER — Encounter (HOSPITAL_BASED_OUTPATIENT_CLINIC_OR_DEPARTMENT_OTHER): Payer: Self-pay

## 2024-03-21 ENCOUNTER — Encounter: Payer: Self-pay | Admitting: Internal Medicine

## 2024-03-21 ENCOUNTER — Ambulatory Visit
Admission: RE | Admit: 2024-03-21 | Discharge: 2024-03-21 | Disposition: A | Discharge status: Home / Self Care | Source: Ambulatory Visit | Attending: Internal Medicine | Admitting: Internal Medicine

## 2024-03-21 ENCOUNTER — Other Ambulatory Visit: Payer: Self-pay

## 2024-03-21 ENCOUNTER — Ambulatory Visit (INDEPENDENT_AMBULATORY_CARE_PROVIDER_SITE_OTHER): Admitting: Internal Medicine

## 2024-03-21 VITALS — BP 107/67 | Resp 16 | Ht 72.0 in | Wt 291.6 lb

## 2024-03-21 DIAGNOSIS — Z9189 Other specified personal risk factors, not elsewhere classified: Secondary | ICD-10-CM | POA: Diagnosis not present

## 2024-03-21 DIAGNOSIS — E669 Obesity, unspecified: Secondary | ICD-10-CM | POA: Diagnosis not present

## 2024-03-21 DIAGNOSIS — Z01818 Encounter for other preprocedural examination: Secondary | ICD-10-CM

## 2024-03-21 DIAGNOSIS — Z8546 Personal history of malignant neoplasm of prostate: Secondary | ICD-10-CM | POA: Diagnosis not present

## 2024-03-21 DIAGNOSIS — Z923 Personal history of irradiation: Secondary | ICD-10-CM | POA: Diagnosis not present

## 2024-03-21 DIAGNOSIS — L598 Other specified disorders of the skin and subcutaneous tissue related to radiation: Secondary | ICD-10-CM | POA: Insufficient documentation

## 2024-03-21 DIAGNOSIS — Z950 Presence of cardiac pacemaker: Secondary | ICD-10-CM | POA: Insufficient documentation

## 2024-03-21 DIAGNOSIS — N3041 Irradiation cystitis with hematuria: Secondary | ICD-10-CM | POA: Insufficient documentation

## 2024-03-21 DIAGNOSIS — I480 Paroxysmal atrial fibrillation: Secondary | ICD-10-CM | POA: Insufficient documentation

## 2024-03-21 DIAGNOSIS — Y842 Radiological procedure and radiotherapy as the cause of abnormal reaction of the patient, or of later complication, without mention of misadventure at the time of the procedure: Secondary | ICD-10-CM | POA: Insufficient documentation

## 2024-03-21 DIAGNOSIS — I10 Essential (primary) hypertension: Secondary | ICD-10-CM | POA: Insufficient documentation

## 2024-03-21 NOTE — Progress Notes (Signed)
 HPI Peter Dunlap returns today for followup. He has sinus node dysfunction, s/p PPM insertion, PAF, atrial tachycardia, obesity, CNS bleed, s/p Watchman. He also has prostate CA and undergone treatment. He's been bothered by penile bleeding and his pending hyperbaric oxygen therapy. He denies chest pain or sob. No edema. No syncope. Rare palpitations. He has gained back 2 lbs.  Allergies  Allergen Reactions   Lactose Intolerance (Gi) Other (See Comments)    UPSET STOMACH    Trazodone  And Nefazodone     Nightmares     Current Outpatient Medications  Medication Sig Dispense Refill   acetaminophen  (TYLENOL ) 500 MG tablet Take 500-1,000 mg by mouth every 6 (six) hours as needed for moderate pain (pain score 4-6).     allopurinol  (ZYLOPRIM ) 100 MG tablet TAKE 1 TABLET(100 MG) BY MOUTH DAILY 90 tablet 0   apalutamide  (ERLEADA ) 60 MG tablet TAKE 4 TABLETS (240 MG TOTAL) BY MOUTH DAILY. MAY BE TAKEN WITH OR WITHOUT FOOD. SWALLOW TABLETS WHOLE. 120 tablet 3   Cyanocobalamin (VITAMIN B 12) 500 MCG TABS Take 500 mcg by mouth in the morning.     FARXIGA  5 MG TABS tablet Take 1 tablet (5 mg total) by mouth daily. 90 tablet 3   finasteride  (PROSCAR ) 5 MG tablet Take 1 tablet (5 mg total) by mouth daily. 90 tablet 3   fluticasone  (FLONASE ) 50 MCG/ACT nasal spray Place 2 sprays into both nostrils daily. 16 g 6   isosorbide  mononitrate (IMDUR ) 30 MG 24 hr tablet Take 1 tablet (30 mg total) by mouth daily. 30 tablet 0   Lancets (ONETOUCH DELICA PLUS LANCET30G) MISC daily.     levothyroxine  (SYNTHROID ) 200 MCG tablet Take 1 tablet (200 mcg total) by mouth daily before breakfast. 90 tablet 3   levothyroxine  (SYNTHROID ) 25 MCG tablet Take 1 tablet (25 mcg total) by mouth See admin instructions. Takes along with 200 mcg to total 225 mcg daily 90 tablet 3   LINZESS 145 MCG CAPS capsule Take 145 mcg by mouth every morning.     loratadine  (CLARITIN ) 10 MG tablet TAKE 1 TABLET(10 MG) BY MOUTH DAILY AS NEEDED  FOR ALLERGIES 30 tablet 2   naloxone (NARCAN) nasal spray 4 mg/0.1 mL Place 4 mg into the nose once as needed (accidental overdose).     nitroGLYCERIN  (NITROSTAT ) 0.4 MG SL tablet PLACE 1 TABLET UNDER THE TONGUE EVERY 5 MINUTES AS NEEDED FOR CHEST AIN CALL 911 AT THIRD DOSE IN 15 MINUTES 25 tablet 11   ONETOUCH VERIO test strip daily.     oxyCODONE -acetaminophen  (PERCOCET) 10-325 MG tablet Take 1 tablet by mouth every 8 (eight) hours as needed for pain. 90 tablet 0   pantoprazole  (PROTONIX ) 40 MG tablet TAKE 1 TABLET(40 MG) BY MOUTH DAILY 30 tablet 3   phenazopyridine  (PYRIDIUM ) 100 MG tablet Take 1 tablet (100 mg total) by mouth 3 (three) times daily with meals. 10 tablet 0   rosuvastatin  (CRESTOR ) 40 MG tablet Take 40 mg by mouth every evening.     silodosin  (RAPAFLO ) 8 MG CAPS capsule TAKE 1 CAPSULE BY MOUTH TWICE DAILY 180 capsule 3   solifenacin  (VESICARE ) 10 MG tablet TAKE 1 TABLET(10 MG) BY MOUTH DAILY 30 tablet 11   sotalol  (BETAPACE ) 80 MG tablet Take 1 tablet (80 mg total) by mouth 2 (two) times daily. NEED OV. 180 tablet 0   Vibegron (GEMTESA) 75 MG TABS Take 75 mg by mouth every Monday, Wednesday, and Friday.  Vitamin D , Ergocalciferol , (DRISDOL ) 1.25 MG (50000 UNIT) CAPS capsule TAKE 1 CAPSULE BY MOUTH EVERY 7 DAYS 12 capsule 0   No current facility-administered medications for this visit.     Past Medical History:  Diagnosis Date   Ambulates with cane    Anemia    history of iron  infusions in 2024   Arteriosclerotic cardiovascular disease (ASCVD) 2005   catheterization in 10/2010:50% mid LAD, diffuse distal disease, circumflex irregularities, large dominant RCA with a 50% ostial, 70% distal, 60% posterolateral and 70% PDA; normal EF   Arthritis    Benign prostatic hypertrophy    Bilateral carpal tunnel syndrome 07/03/2018   Cerebrovascular disease 2010   R. carotid endarterectomy; Duplex in 10/2010-widely patent ICAs, subtotal left vertebral-not thought to be contributing  to symptoms   Cervical spine disease    CT in 2012-advanced degeneration and spondylosis with moderate spinal stenosis at C3-C6   CHF (congestive heart failure) [facility])    Follows w/ cardiologist, Dr. Danelle Birmingham.   Depression    Diabetes mellitus without complication (HCC)    Takes Farxiga .   Erectile dysfunction    Family history of breast cancer    Family history of cancer of mouth    Family history of CML (chronic monocytic leukemia)    Family history of lung cancer    Family history of ovarian cancer    Family history of stomach cancer    Full dentures    Wife states that he usually does not wear his dentures.   Gastroesophageal reflux disease    H/O hiatal hernia    H/O: substance abuse (HCC)    Cocaine, marijuana, alcohol.  Quit 2013.    Headache    Hyperlipidemia    Hypertension    Follows w/ cardiologist, Dr. Danelle Birmingham.   Hypothyroidism    Follows w/ Benton Rio, NP, endocrinology.   Non-ST elevation myocardial infarction (NSTEMI), initial episode of care [facility]) 12/02/2013   DES LAD   Obesity    Presence of permanent cardiac pacemaker    Prostate cancer (HCC)    Sleep apnea    CPAP   Tachy-brady syndrome (HCC)    a. 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   Past Surgical History:  Procedure Laterality Date   BRAIN SURGERY  2015   hematoma evacuation   BURR HOLE Right 04/13/2014   Procedure: SOLMON HERRLICH;  Surgeon: Victory DELENA Gunnels, MD;  Location: [facility] NEURO ORS;  Service: Neurosurgery;  Laterality: Right;   CAROTID ENDARTERECTOMY Right Feb. 25, 2010    CEA   COLONOSCOPY WITH PROPOFOL  N/A 06/24/2021   Procedure: COLONOSCOPY WITH PROPOFOL ;  Surgeon: Rollin Dover, MD;  Location: [facility] ENDOSCOPY;  Service: Endoscopy;  Laterality: N/A;   CORONARY ANGIOPLASTY  WITH STENT PLACEMENT  12/03/2013   LAD 90%-->0% W/ Promus Premier DES 3.0 mm x 16 mm, CFX OK, RCA 40%, EF 70-75%   CYSTOSCOPY WITH FULGERATION N/A 06/21/2023   Procedure: CYSTOSCOPY WITH FULGERATION and clot evacuation;  Surgeon: Matilda Senior, MD;  Location: [facility];  Service: Urology;  Laterality: N/A;   LEFT ATRIAL APPENDAGE OCCLUSION N/A 08/05/2015   Procedure: LEFT ATRIAL APPENDAGE OCCLUSION;  Surgeon: Lynwood Rakers, MD;  Location:  [facility] INVASIVE CV LAB;  Service: Cardiovascular;  Laterality: N/A;   LEFT HEART CATH AND CORONARY ANGIOGRAPHY N/A 09/13/2021   Procedure: LEFT HEART CATH AND CORONARY ANGIOGRAPHY;  Surgeon: Wendel Lurena POUR, MD;  Location: [facility] INV[facility]IVE CV LAB;  Service: Cardiovascular;  Laterality: N/A;   LEFT HEART CATHETERIZATION WITH CORONARY ANGIOGRAM Left 12/03/2013   Procedure: LEFT HEART CATHETERIZATION WITH CORONARY ANGIOGRAM;  Surgeon: Alm LELON Clay, MD;  Location: [facility] CATH LAB;  Service: Cardiovascular;  Laterality: Left;   LEFT HEART CATHETERIZATION WITH CORONARY ANGIOGRAM N/A 01/26/2014   Procedure: LEFT HEART CATHETERIZATION WITH CORONARY ANGIOGRAM;  Surgeon: Candyce GORMAN Reek, MD;  Location: [facility] CATH LAB;  Service: Cardiovascular;  Laterality: N/A;   LEFT HEART CATHETERIZATION WITH CORONARY ANGIOGRAM N/A 08/03/2014   Procedure: LEFT HEART CATHETERIZATION WITH CORONARY ANGIOGRAM;  Surgeon: Lonni JONETTA Cash, MD;  Location: [facility] CATH LAB;  Service: Cardiovascular;  Laterality: N/A;   PERCUTANEOUS CORONARY STENT INTERVENTION (PCI-S)  12/03/2013   Procedure: PERCUTANEOUS CORONARY STENT INTERVENTION (PCI-S);  Surgeon: Alm LELON Clay, MD;  Location: [facility] CATH LAB;  Service: Cardiovascular;;   PERMANENT PACEMAKER INSERTION N/A 09/18/2014   Procedure: PERMANENT PACEMAKER INSERTION;  Surgeon: Danelle LELON Birmingham, MD;  Location[facility] CATH LAB;  Service: Cardiovascular;  Laterality: N/A;   POLYPECTOMY  06/24/2021   Procedure: POLYPECTOMY;  Surgeon: Rollin Dover, MD;   Location: [facility] ENDOSCOPY;  Service: Endoscopy;;   RADIOFREQUENCY ABLATION  2005   for PSVT   TEE WITHOUT CARDIOVERSION N/A 07/27/2015   Procedure: TRANSESOPHAGEAL ECHOCARDIOGRAM (TEE);  Surgeon: Redell GORMAN Shallow, MD;  Location: [facility] ENDOSCOPY;  Service: Cardiovascular;  Laterality: N/A;   TEE WITHOUT CARDIOVERSION N/A 09/15/2015   Procedure: TRANSESOPHAGEAL ECHOCARDIOGRAM (TEE);  Surgeon: Aleene JINNY Passe, MD;  Location: [facility] ENDOSCOPY;  Service: Cardiovascular;  Laterality: N/A;     Family History  Problem Relation Age of Onset   Hypertension Mother        Cerebrovascular disease   Diabetes Mother    Coronary artery disease Father 75   Diabetes type II Father    Hypertension Father    Heart attack Father    Diabetes Brother    Hypertension Brother    Diabetes Sister    Hypertension Sister    Heart attack Sister 74   Leukemia Sister 37       CML   Breast cancer Maternal Grandmother        dx 67s   Lung cancer Maternal Uncle        dx >50, smoker   Cancer Cousin        mouth cancer, dx 21s, no smoking/chew tobacco hx (maternal 1st cousin)   Ovarian cancer Cousin        dx <50 (maternal 1st cousin)   Stomach cancer Cousin        dx 2s (maternal 1st cousin)   Cancer Cousin        type unknown to pt, dx >50 (paternal 1st cousin)     Social History   Socioeconomic History   Marital status: Married    Spouse name: Not on file   Number of children: 0   Years of education: Not on file   Highest education level: Not on file  Occupational History   Occupation: Retired  Tobacco Use   Smoking status: Former    Current packs/day: 0.00    Average packs/day: 1 pack/day for 40.0 years (40.0 ttl pk-yrs)    Types: Cigarettes    Start date: 10/20/1972    Quit  date: 10/10/2012    Years since quitting: 11.4   Smokeless tobacco: Never   Tobacco comments:    Quit in May.   Vaping Use   Vaping status: Never Used  Substance and Sexual Activity   Alcohol use: Not Currently    Comment:  former drinker-- sober since 2013.    Drug use: Not Currently    Types: Cocaine    Comment: quit cocaine 10/2011   Sexual activity: Yes    Partners: Female  Other Topics Concern   Not on file  Social History Narrative   Lives in [place].   Social Drivers of Corporate investment banker Strain: Low Risk  (04/19/2020)   Overall Financial Resource Strain (CARDIA)    Difficulty of Paying Living Expenses: Not hard at all  Food Insecurity: No Food Insecurity (06/08/2023)   Hunger Vital Sign    Worried About Running Out of Food in the Last Year: Never true    Ran Out of Food in the Last Year: Never true  Transportation Needs: No Transportation Needs (06/08/2023)   PRAPARE - Administrator, Civil Service (Medical): No    Lack of Transportation (Non-Medical): No  Physical Activity: Inactive (04/19/2020)   Exercise Vital Sign    Days of Exercise per Week: 0 days    Minutes of Exercise per Session: 0 min  Stress: No Stress Concern Present (04/19/2020)   [employer] of Occupational Health - Occupational Stress Questionnaire    Feeling of Stress : Not at all  Social Connections: Socially Integrated (06/08/2023)   Social Connection and Isolation Panel    Frequency of Communication with Friends and Family: More than three times a week    Frequency of Social Gatherings with Friends and Family: Three times a week    Attends Religious Services: More than 4 times per year    Active Member of Clubs or Organizations: Yes    Attends Banker Meetings: More than 4 times per year    Marital Status: Married  Catering manager Violence: Not At Risk (06/08/2023)   Humiliation, Afraid, Rape, and Kick questionnaire    Fear of Current or Ex-Partner: No    Emotionally Abused: No    Physically Abused: No    Sexually Abused: No     BP 107/67 (BP Location: Left Arm, Patient Position: Sitting, Cuff Size: Large)   Resp 16   Ht 6' (1.829 m)   Wt 291 lb 9.6 oz (132.3 kg)   SpO2  98%   BMI 39.55 kg/m   Physical Exam:  Well appearing NAD HEENT: Unremarkable Neck:  No JVD, no thyromegally Lymphatics:  No adenopathy Back:  No CVA tenderness Lungs:  Clear HEART:  Regular rate rhythm, no murmurs, no rubs, no clicks Abd:  soft, positive bowel sounds, no organomegally, no rebound, no guarding Ext:  2 plus pulses, no edema, no cyanosis, no clubbing Skin:  No rashes no nodules Neuro:  CN II through XII intact, motor grossly intact  EKG - nsr  DEVICE  Normal device function.  See PaceArt for details.   Assess/Plan:  PAF - he is maintaining NSR. He will continue his current meds. Stroke prevention -he is s/p Watchman and is doing well.  HTN - his bp is controlled.  Obesity - he admits to being less active and I have encouraged him to increase his physical activity.    Danelle Dannia Snook,MD

## 2024-03-21 NOTE — Patient Instructions (Signed)
 Medication Instructions:  Your physician recommends that you continue on your current medications as directed. Please refer to the Current Medication list given to you today.  *If you need a refill on your cardiac medications before your next appointment, please call your pharmacy*  Lab Work: None ordered.  You may go to any [facility] Location for your lab work:  [facility] - [address] ([facility]) - [address] [phone] [address]  [place] - [address]   [place]  - [address]   [place] - [address] - [address] [employer]  - [address] [place] [address] ([facility]   If you have labs (blood work) drawn today and your tests are completely normal, you will receive your results only by: [employer] (if you have MyChart)  If you have any lab test that is abnormal or we need to change your treatment, we will call you or send a MyChart message to review the results.  Testing/Procedures: None ordered.  Follow-Up: At [facility], you and your health needs are our priority.  As part of our continuing mission to provide you with exceptional heart care, we have created designated Provider Care Teams.  These Care Teams include your primary Cardiologist (physician) and Advanced Practice Providers (APPs -  Physician Assistants and Nurse Practitioners) who all work together to provide you with the care you need, when you need it.  Your next appointment:   1 year(s)  The format for your next appointment:   In Person  Provider:   Donnice Primus, MD or one of the following Advanced Practice Providers on your designated Care Team:   Charlies Arthur, [place] Ozell Jodie Passey, [place] Leotis Barrack, NP  Note: Remote monitoring is used to monitor your Pacemaker/ ICD from home. This monitoring reduces the number of office visits required to check  your device to one time per year. It allows us  to keep an eye on the functioning of your device to ensure it is working properly.

## 2024-03-24 ENCOUNTER — Other Ambulatory Visit: Payer: Self-pay

## 2024-04-08 ENCOUNTER — Other Ambulatory Visit: Payer: Self-pay | Admitting: Urology

## 2024-04-14 ENCOUNTER — Other Ambulatory Visit: Payer: Self-pay | Admitting: Internal Medicine

## 2024-04-15 ENCOUNTER — Ambulatory Visit: Admitting: Physician Assistant

## 2024-04-15 ENCOUNTER — Encounter: Payer: Self-pay | Admitting: Physician Assistant

## 2024-04-15 ENCOUNTER — Ambulatory Visit (HOSPITAL_COMMUNITY)
Admission: RE | Admit: 2024-04-15 | Discharge: 2024-04-15 | Disposition: A | Discharge status: Home / Self Care | Source: Ambulatory Visit | Attending: Vascular Surgery | Admitting: Vascular Surgery

## 2024-04-15 VITALS — BP 109/73 | HR 60 | Temp 97.7°F | Wt 290.0 lb

## 2024-04-15 DIAGNOSIS — I6523 Occlusion and stenosis of bilateral carotid arteries: Secondary | ICD-10-CM

## 2024-04-15 NOTE — Progress Notes (Signed)
 Office Note   History of Present Illness   Peter Dunlap is a 68 y.o. (09/29/1955) male who presents for surveillance of carotid artery stenosis.  He has a history of right carotid endarterectomy in February 2010 by Dr. Oris.  He has no prior history of CVA or TIA.  He returns today for follow-up.  He has no complaints at today's office visit.  He denies any recent strokelike symptoms such as slurred speech, facial droop, sudden visual changes, or sudden weakness/numbness.  He also denies any recent diagnosis of CVA or TIA.  Current Outpatient Medications  Medication Sig Dispense Refill   acetaminophen  (TYLENOL ) 500 MG tablet Take 500-1,000 mg by mouth every 6 (six) hours as needed for moderate pain (pain score 4-6).     allopurinol  (ZYLOPRIM ) 100 MG tablet TAKE 1 TABLET(100 MG) BY MOUTH DAILY 90 tablet 0   apalutamide  (ERLEADA ) 60 MG tablet TAKE 4 TABLETS (240 MG TOTAL) BY MOUTH DAILY. MAY BE TAKEN WITH OR WITHOUT FOOD. SWALLOW TABLETS WHOLE. 120 tablet 3   Cyanocobalamin (VITAMIN B 12) 500 MCG TABS Take 500 mcg by mouth in the morning.     FARXIGA  5 MG TABS tablet Take 1 tablet (5 mg total) by mouth daily. 90 tablet 3   finasteride  (PROSCAR ) 5 MG tablet Take 1 tablet (5 mg total) by mouth daily. 90 tablet 3   fluticasone  (FLONASE ) 50 MCG/ACT nasal spray Place 2 sprays into both nostrils daily. 16 g 6   isosorbide  mononitrate (IMDUR ) 30 MG 24 hr tablet Take 1 tablet (30 mg total) by mouth daily. 30 tablet 0   Lancets (ONETOUCH DELICA PLUS LANCET30G) MISC daily.     levothyroxine  (SYNTHROID ) 200 MCG tablet Take 1 tablet (200 mcg total) by mouth daily before breakfast. 90 tablet 3   levothyroxine  (SYNTHROID ) 25 MCG tablet Take 1 tablet (25 mcg total) by mouth See admin instructions. Takes along with 200 mcg to total 225 mcg daily 90 tablet 3   LINZESS 145 MCG CAPS capsule Take 145 mcg by mouth every morning.     loratadine  (CLARITIN ) 10 MG tablet TAKE 1 TABLET(10 MG) BY MOUTH DAILY AS  NEEDED FOR ALLERGIES 30 tablet 2   naloxone (NARCAN) nasal spray 4 mg/0.1 mL Place 4 mg into the nose once as needed (accidental overdose).     nitroGLYCERIN  (NITROSTAT ) 0.4 MG SL tablet PLACE 1 TABLET UNDER THE TONGUE EVERY 5 MINUTES AS NEEDED FOR CHEST AIN CALL 911 AT THIRD DOSE IN 15 MINUTES 25 tablet 11   ONETOUCH VERIO test strip daily.     oxyCODONE -acetaminophen  (PERCOCET) 10-325 MG tablet Take 1 tablet by mouth every 8 (eight) hours as needed for pain. 90 tablet 0   pantoprazole  (PROTONIX ) 40 MG tablet TAKE 1 TABLET(40 MG) BY MOUTH DAILY 30 tablet 3   phenazopyridine  (PYRIDIUM ) 100 MG tablet Take 1 tablet (100 mg total) by mouth 3 (three) times daily with meals. 10 tablet 0   rosuvastatin  (CRESTOR ) 40 MG tablet Take 40 mg by mouth every evening.     silodosin  (RAPAFLO ) 8 MG CAPS capsule TAKE 1 CAPSULE BY MOUTH TWICE DAILY 180 capsule 3   solifenacin  (VESICARE ) 10 MG tablet TAKE 1 TABLET(10 MG) BY MOUTH DAILY 30 tablet 11   sotalol  (BETAPACE ) 80 MG tablet Take 1 tablet (80 mg total) by mouth 2 (two) times daily. NEED OV. 180 tablet 0   Vibegron (GEMTESA) 75 MG TABS Take 75 mg by mouth every Monday, Wednesday, and Friday.  Vitamin D , Ergocalciferol , (DRISDOL ) 1.25 MG (50000 UNIT) CAPS capsule TAKE 1 CAPSULE BY MOUTH EVERY 7 DAYS 12 capsule 0   No current facility-administered medications for this visit.    REVIEW OF SYSTEMS (negative unless checked):   Cardiac:  []  Chest pain or chest pressure? []  Shortness of breath upon activity? []  Shortness of breath when lying flat? []  Irregular heart rhythm?  Vascular:  []  Pain in calf, thigh, or hip brought on by walking? []  Pain in feet at night that wakes you up from your sleep? []  Blood clot in your veins? []  Leg swelling?  Pulmonary:  []  Oxygen at home? []  Productive cough? []  Wheezing?  Neurologic:  []  Sudden weakness in arms or legs? []  Sudden numbness in arms or legs? []  Sudden onset of difficult speaking or slurred  speech? []  Temporary loss of vision in one eye? []  Problems with dizziness?  Gastrointestinal:  []  Blood in stool? []  Vomited blood?  Genitourinary:  []  Burning when urinating? []  Blood in urine?  Psychiatric:  []  Major depression  Hematologic:  []  Bleeding problems? []  Problems with blood clotting?  Dermatologic:  []  Rashes or ulcers?  Constitutional:  []  Fever or chills?  Ear/Nose/Throat:  []  Change in hearing? []  Nose bleeds? []  Sore throat?  Musculoskeletal:  []  Back pain? []  Joint pain? []  Muscle pain?   Physical Examination   Vitals:   04/15/24 0826 04/15/24 0827  BP: 109/72 109/73  Pulse: 70 60  Temp: 97.7 F (36.5 C)   TempSrc: Temporal   Weight: 290 lb (131.5 kg)    Body mass index is 39.33 kg/m.  General:  WDWN in NAD; vital signs documented above Gait: Not observed HENT: WNL, normocephalic Pulmonary: normal non-labored breathing , without rales, rhonchi,  wheezing Cardiac: Regular Abdomen: soft, NT, no masses Skin: without rashes Vascular Exam/Pulses: palpable radial pulses bilaterally Extremities: without ischemic changes, without gangrene , without cellulitis; without open wounds;  Musculoskeletal: no muscle wasting or atrophy  Neurologic: A&O X 3;  No focal weakness or paresthesias are detected Psychiatric:  The pt has Normal affect.  Non-Invasive Vascular Imaging   Bilateral Carotid Duplex (04/15/2024):  R ICA stenosis:  1-39% R VA:  patent and antegrade L ICA stenosis:  40-59% L VA:  patent and antegrade   Medical Decision Making   Peter Dunlap is a 68 y.o. male who presents for surveillance of carotid artery stenosis  Based on the patient's vascular studies, his carotid artery stenosis on the right is stable at 1 to 39%.  His carotid stenosis on the left has increased to 40 to 59%. He denies any strokelike symptoms such as slurred speech, facial droop, sudden visual changes, or sudden weakness/numbness.  He denies any  recent diagnosis of CVA or TIA. He has no neuro deficits on exam.  He has palpable and equal radial pulses bilaterally He will continue his daily aspirin  and statin and follow-up with our office in 1 year with repeat carotid duplex   Ahmed Holster PA-C [facility] Office: [phone]  Clinic MD: Miguel

## 2024-04-16 ENCOUNTER — Other Ambulatory Visit: Payer: Self-pay

## 2024-04-17 ENCOUNTER — Other Ambulatory Visit: Payer: Self-pay

## 2024-04-17 NOTE — Progress Notes (Signed)
 Specialty Pharmacy Ongoing Clinical Assessment Note  Peter Dunlap is a 68 y.o. male who is being followed by the specialty pharmacy service for RxSp Oncology   Patient's specialty medication(s) reviewed today: Apalutamide  (ERLEADA )   Missed doses in the last 4 weeks: 1   Patient/Caregiver did not have any additional questions or concerns.   Therapeutic benefit summary: Patient is achieving benefit   Adverse events/side effects summary: No adverse events/side effects   Patient's therapy is appropriate to: Continue    Goals Addressed             This Visit's Progress    Slow Disease Progression   On track    Patient is on track. Patient will maintain adherence.  PSA remains <0.02 ng/mL as of 02/19/24 labs.          Follow up: 6 months  [facility]

## 2024-04-17 NOTE — Progress Notes (Signed)
 Specialty Pharmacy Refill Coordination Note  Peter Dunlap is a 68 y.o. male contacted today regarding refills of specialty medication(s) Apalutamide  (ERLEADA )   Patient requested Delivery   Delivery date: 04/24/24   Verified address: [name] [name]   Medication will be filled on: 04/23/24

## 2024-04-23 ENCOUNTER — Other Ambulatory Visit: Payer: Self-pay

## 2024-04-26 LAB — COMPREHENSIVE METABOLIC PANEL WITH GFR
ALT: 10 IU/L (ref 0–44)
AST: 20 IU/L (ref 0–40)
Albumin: 4.1 g/dL (ref 3.9–4.9)
Alkaline Phosphatase: 106 IU/L (ref 47–123)
BUN/Creatinine Ratio: 9 — ABNORMAL LOW (ref 10–24)
BUN: 10 mg/dL (ref 8–27)
Bilirubin Total: 0.5 mg/dL (ref 0.0–1.2)
CO2: 19 mmol/L — ABNORMAL LOW (ref 20–29)
Calcium: 9.4 mg/dL (ref 8.6–10.2)
Chloride: 106 mmol/L (ref 96–106)
Creatinine, Ser: 1.17 mg/dL (ref 0.76–1.27)
Globulin, Total: 3.1 g/dL (ref 1.5–4.5)
Glucose: 167 mg/dL — ABNORMAL HIGH (ref 70–99)
Potassium: 4.2 mmol/L (ref 3.5–5.2)
Sodium: 141 mmol/L (ref 134–144)
Total Protein: 7.2 g/dL (ref 6.0–8.5)
eGFR: 68 mL/min/1.73 (ref >59)

## 2024-04-26 LAB — LIPID PANEL
Chol/HDL Ratio: 2.9 ratio (ref 0.0–5.0)
Cholesterol, Total: 149 mg/dL (ref 100–199)
HDL: 51 mg/dL (ref >39)
LDL Chol Calc (NIH): 78 mg/dL (ref 0–99)
Triglycerides: 113 mg/dL (ref 0–149)
VLDL Cholesterol Cal: 20 mg/dL (ref 5–40)

## 2024-04-26 LAB — TSH: TSH: 8.84 u[IU]/mL — ABNORMAL HIGH (ref 0.450–4.500)

## 2024-04-26 LAB — T4, FREE: Free T4: 1.44 ng/dL (ref 0.82–1.77)

## 2024-04-28 ENCOUNTER — Encounter: Payer: Self-pay | Admitting: Nurse Practitioner

## 2024-04-28 ENCOUNTER — Encounter (HOSPITAL_BASED_OUTPATIENT_CLINIC_OR_DEPARTMENT_OTHER): Attending: Internal Medicine | Admitting: Internal Medicine

## 2024-04-28 ENCOUNTER — Ambulatory Visit (INDEPENDENT_AMBULATORY_CARE_PROVIDER_SITE_OTHER): Admitting: Nurse Practitioner

## 2024-04-28 VITALS — BP 106/74 | HR 60 | Ht 72.0 in | Wt 286.4 lb

## 2024-04-28 DIAGNOSIS — I10 Essential (primary) hypertension: Secondary | ICD-10-CM | POA: Diagnosis not present

## 2024-04-28 DIAGNOSIS — N3041 Irradiation cystitis with hematuria: Secondary | ICD-10-CM | POA: Diagnosis not present

## 2024-04-28 DIAGNOSIS — E782 Mixed hyperlipidemia: Secondary | ICD-10-CM

## 2024-04-28 DIAGNOSIS — E1165 Type 2 diabetes mellitus with hyperglycemia: Secondary | ICD-10-CM

## 2024-04-28 DIAGNOSIS — E559 Vitamin D deficiency, unspecified: Secondary | ICD-10-CM | POA: Diagnosis not present

## 2024-04-28 DIAGNOSIS — Y842 Radiological procedure and radiotherapy as the cause of abnormal reaction of the patient, or of later complication, without mention of misadventure at the time of the procedure: Secondary | ICD-10-CM | POA: Diagnosis not present

## 2024-04-28 DIAGNOSIS — E890 Postprocedural hypothyroidism: Secondary | ICD-10-CM

## 2024-04-28 DIAGNOSIS — L598 Other specified disorders of the skin and subcutaneous tissue related to radiation: Secondary | ICD-10-CM

## 2024-04-28 DIAGNOSIS — Z7984 Long term (current) use of oral hypoglycemic drugs: Secondary | ICD-10-CM | POA: Diagnosis not present

## 2024-04-28 DIAGNOSIS — Z8546 Personal history of malignant neoplasm of prostate: Secondary | ICD-10-CM

## 2024-04-28 DIAGNOSIS — N3040 Irradiation cystitis without hematuria: Secondary | ICD-10-CM | POA: Diagnosis present

## 2024-04-28 LAB — POCT GLYCOSYLATED HEMOGLOBIN (HGB A1C): Hemoglobin A1C: 7 % — AB (ref 4.0–5.6)

## 2024-04-28 LAB — GLUCOSE, CAPILLARY
Glucose-Capillary: 187 mg/dL — ABNORMAL HIGH (ref 70–99)
Glucose-Capillary: 119 mg/dL — ABNORMAL HIGH (ref 70–99)

## 2024-04-28 MED ORDER — LEVOTHYROXINE SODIUM 200 MCG PO TABS: 200.0000 ug | ORAL_TABLET | Freq: Every day | ORAL | 3 refills | Status: AC

## 2024-04-28 MED ORDER — METFORMIN HCL ER 500 MG PO TB24: 500.0000 mg | ORAL_TABLET | Freq: Every day | ORAL | 1 refills | Status: AC

## 2024-04-28 MED ORDER — LEVOTHYROXINE SODIUM 25 MCG PO TABS: 25.0000 ug | ORAL_TABLET | ORAL | 3 refills | Status: AC

## 2024-04-28 NOTE — Patient Instructions (Signed)

## 2024-04-28 NOTE — Progress Notes (Signed)
 04/28/2024                                                 Endocrinology Follow Up  Subjective:    Patient ID: Peter Dunlap, male    DOB: [birth year], PCP Shelda Atlas, MD  Patient presents today for follow-up of hypothyroidism following radioactive iodine treatment, and type 2 diabetes.  Past Medical History:  Diagnosis Date   Ambulates with cane    Anemia    history of iron  infusions in 2024   Arteriosclerotic cardiovascular disease (ASCVD) 2005   catheterization in 10/2010:50% mid LAD, diffuse distal disease, circumflex irregularities, large dominant RCA with a 50% ostial, 70% distal, 60% posterolateral and 70% PDA; normal EF   Arthritis    Benign prostatic hypertrophy    Bilateral carpal tunnel syndrome 07/03/2018   Cerebrovascular disease 2010   R. carotid endarterectomy; Duplex in 10/2010-widely patent ICAs, subtotal left vertebral-not thought to be contributing to symptoms   Cervical spine disease    CT in 2012-advanced degeneration and spondylosis with moderate spinal stenosis at C3-C6   CHF (congestive heart failure) [facility])    Follows w/ cardiologist, Dr. Danelle Birmingham.   Depression    Diabetes mellitus without complication (HCC)    Takes Farxiga .   Erectile dysfunction    Family history of breast cancer    Family history of cancer of mouth    Family history of CML (chronic monocytic leukemia)    Family history of lung cancer    Family history of ovarian cancer    Family history of stomach cancer    Full dentures    Wife states that he usually does not wear his dentures.   Gastroesophageal reflux disease    H/O hiatal hernia    H/O: substance abuse (HCC)    Cocaine, marijuana, alcohol.  Quit 2013.    Headache    Hyperlipidemia    Hypertension    Follows w/ cardiologist, Dr. Danelle Birmingham.   Hypothyroidism    Follows w/ Benton Rio, NP, endocrinology.   Non-ST elevation myocardial infarction (NSTEMI), initial episode of care [facility]) 12/02/2013   DES LAD    Obesity    Presence of permanent cardiac pacemaker    Prostate cancer (HCC)    Sleep apnea    CPAP   Tachy-brady syndrome (HCC)    a. 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 25, 2010    CEA   COLONOSCOPY WITH PROPOFOL  N/A 06/24/2021   Procedure: COLONOSCOPY WITH PROPOFOL ;  Surgeon: Rollin Dover, MD;  Location: [facility] ENDOSCOPY;  Service: Endoscopy;  Laterality: N/A;   CORONARY ANGIOPLASTY WITH STENT PLACEMENT  12/03/2013   LAD 90%-->0% W/ Promus Premier DES 3.0 mm x 16 mm, CFX OK, RCA 40%, EF 70-75%   CYSTOSCOPY WITH FULGERATION N/A 06/21/2023   Procedure: CYSTOSCOPY WITH FULGERATION and clot evacuation;  Surgeon:  Matilda Senior, MD;  Location: [facility];  Service: Urology;  Laterality: N/A;   LEFT ATRIAL APPENDAGE OCCLUSION N/A 08/05/2015   Procedure: LEFT ATRIAL APPENDAGE OCCLUSION;  Surgeon: Lynwood Rakers, MD;  Location: [facility] INVASIVE CV LAB;  Service: Cardiovascular;  Laterality: N/A;   LEFT HEART CATH AND CORONARY ANGIOGRAPHY N/A 09/13/2021   Procedure: LEFT HEART CATH AND CORONARY ANGIOGRAPHY;  Surgeon: Wendel Lurena POUR, MD;  Location: [facility] INVASIVE CV LAB;  Service: Cardiovascular;  Laterality: N/A;   LEFT HEART CATHETERIZATION WITH CORONARY ANGIOGRAM Left 12/03/2013   Procedure: LEFT HEART CATHETERIZATION WITH CORONARY ANGIOGRAM;  Surgeon: Alm LELON Clay, MD;  Location: [facility] CATH LAB;  Service: Cardiovascular;  Laterality: Left;   LEFT HEART CATHETERIZATION WITH CORONARY ANGIOGRAM N/A 01/26/2014   Procedure: LEFT HEART CATHETERIZATION WITH CORONARY ANGIOGRAM;   Surgeon: Candyce GORMAN Reek, MD;  Location: [facility] CATH LAB;  Service: Cardiovascular;  Laterality: N/A;   LEFT HEART CATHETERIZATION WITH CORONARY ANGIOGRAM N/A 08/03/2014   Procedure: LEFT HEART CATHETERIZATION WITH CORONARY ANGIOGRAM;  Surgeon: Lonni JONETTA Cash, MD;  Location: [facility] CATH LAB;  Service: Cardiovascular;  Laterality: N/A;   PERCUTANEOUS CORONARY STENT INTERVENTION (PCI-S)  12/03/2013   Procedure: PERCUTANEOUS CORONARY STENT INTERVENTION (PCI-S);  Surgeon: Alm LELON Clay, MD;  Location: [facility] CATH LAB;  Service: Cardiovascular;;   PERMANENT PACEMAKER INSERTION N/A 09/18/2014   Procedure: PERMANENT PACEMAKER INSERTION;  Surgeon: Danelle LELON Birmingham, MD;  Location: [facility] CATH LAB;  Service: Cardiovascular;  Laterality: N/A;   POLYPECTOMY  06/24/2021   Procedure: POLYPECTOMY;  Surgeon: Rollin Dover, MD;  Location: [facility] ENDOSCOPY;  Service: Endoscopy;;   RADIOFREQUENCY ABLATION  2005   for PSVT   TEE WITHOUT CARDIOVERSION N/A 07/27/2015   Procedure: TRANSESOPHAGEAL ECHOCARDIOGRAM (TEE);  Surgeon: Redell GORMAN Shallow, MD;  Location: [facility] ENDOSCOPY;  Service: Cardiovascular;  Laterality: N/A;   TEE WITHOUT CARDIOVERSION N/A 09/15/2015   Procedure: TRANSESOPHAGEAL ECHOCARDIOGRAM (TEE);  Surgeon: Aleene JINNY Passe, MD;  Location: [facility] ENDOSCOPY;  Service: Cardiovascular;  Laterality: N/A;   Social History   Socioeconomic History   Marital status: Married    Spouse name: Not on file   Number of children: 0   Years of education: Not on file   Highest education level: Not on file  Occupational History   Occupation: Retired  Tobacco Use   Smoking status: Former    Current packs/day: 0.00    Average packs/day: 1 pack/day for 40.0 years (40.0 ttl pk-yrs)    Types: Cigarettes    Start date: 10/20/1972    Quit date: 10/10/2012    Years since quitting: 11.5   Smokeless tobacco: Never   Tobacco comments:    Quit in May.   Vaping Use   Vaping status: Never Used  Substance and Sexual Activity   Alcohol use:  Not Currently    Comment: former drinker-- sober since 2013.    Drug use: Not Currently    Types: Cocaine    Comment: quit cocaine 10/2011   Sexual activity: Yes    Partners: Female  Other Topics Concern   Not on file  Social History Narrative   Lives in [place].   Social Drivers of Corporate Investment Banker Strain: Low Risk  (04/19/2020)   Overall Financial Resource Strain (CARDIA)    Difficulty of Paying Living Expenses: Not hard at all  Food Insecurity: No Food Insecurity (06/08/2023)   Hunger Vital Sign    Worried About Running Out of Food in the Last Year: Never true  Ran Out of Food in the Last Year: Never true  Transportation Needs: No Transportation Needs (06/08/2023)   PRAPARE - Administrator, Civil Service (Medical): No    Lack of Transportation (Non-Medical): No  Physical Activity: Inactive (04/19/2020)   Exercise Vital Sign    Days of Exercise per Week: 0 days    Minutes of Exercise per Session: 0 min  Stress: No Stress Concern Present (04/19/2020)   [employer] of Occupational Health - Occupational Stress Questionnaire    Feeling of Stress : Not at all  Social Connections: Socially Integrated (06/08/2023)   Social Connection and Isolation Panel    Frequency of Communication with Friends and Family: More than three times a week    Frequency of Social Gatherings with Friends and Family: Three times a week    Attends Religious Services: More than 4 times per year    Active Member of Clubs or Organizations: Yes    Attends Banker Meetings: More than 4 times per year    Marital Status: Married   Outpatient Encounter Medications as of 04/28/2024  Medication Sig   acetaminophen  (TYLENOL ) 500 MG tablet Take 500-1,000 mg by mouth every 6 (six) hours as needed for moderate pain (pain score 4-6).   allopurinol  (ZYLOPRIM ) 100 MG tablet TAKE 1 TABLET(100 MG) BY MOUTH DAILY   apalutamide  (ERLEADA ) 60 MG tablet TAKE 4 TABLETS (240 MG TOTAL)  BY MOUTH DAILY. MAY BE TAKEN WITH OR WITHOUT FOOD. SWALLOW TABLETS WHOLE.   Cyanocobalamin (VITAMIN B 12) 500 MCG TABS Take 500 mcg by mouth in the morning.   finasteride  (PROSCAR ) 5 MG tablet Take 1 tablet (5 mg total) by mouth daily.   fluticasone  (FLONASE ) 50 MCG/ACT nasal spray Place 2 sprays into both nostrils daily.   isosorbide  mononitrate (IMDUR ) 30 MG 24 hr tablet TAKE 1 TABLET(30 MG) BY MOUTH DAILY   Lancets (ONETOUCH DELICA PLUS LANCET30G) MISC daily.   LINZESS 145 MCG CAPS capsule Take 145 mcg by mouth every morning.   loratadine  (CLARITIN ) 10 MG tablet TAKE 1 TABLET(10 MG) BY MOUTH DAILY AS NEEDED FOR ALLERGIES   metFORMIN  (GLUCOPHAGE -XR) 500 MG 24 hr tablet Take 1 tablet (500 mg total) by mouth daily with breakfast.   naloxone (NARCAN) nasal spray 4 mg/0.1 mL Place 4 mg into the nose once as needed (accidental overdose).   nitroGLYCERIN  (NITROSTAT ) 0.4 MG SL tablet PLACE 1 TABLET UNDER THE TONGUE EVERY 5 MINUTES AS NEEDED FOR CHEST AIN CALL 911 AT THIRD DOSE IN 15 MINUTES   ONETOUCH VERIO test strip daily.   oxyCODONE -acetaminophen  (PERCOCET) 10-325 MG tablet Take 1 tablet by mouth every 8 (eight) hours as needed for pain.   pantoprazole  (PROTONIX ) 40 MG tablet TAKE 1 TABLET(40 MG) BY MOUTH DAILY   phenazopyridine  (PYRIDIUM ) 100 MG tablet Take 1 tablet (100 mg total) by mouth 3 (three) times daily with meals.   rosuvastatin  (CRESTOR ) 40 MG tablet Take 40 mg by mouth every evening.   silodosin  (RAPAFLO ) 8 MG CAPS capsule TAKE 1 CAPSULE BY MOUTH TWICE DAILY   solifenacin  (VESICARE ) 10 MG tablet TAKE 1 TABLET(10 MG) BY MOUTH DAILY   sotalol  (BETAPACE ) 80 MG tablet Take 1 tablet (80 mg total) by mouth 2 (two) times daily. NEED OV.   Vibegron (GEMTESA) 75 MG TABS Take 75 mg by mouth every Monday, Wednesday, and Friday.   Vitamin D , Ergocalciferol , (DRISDOL ) 1.25 MG (50000 UNIT) CAPS capsule TAKE 1 CAPSULE BY MOUTH EVERY 7 DAYS   [DISCONTINUED]  FARXIGA  5 MG TABS tablet Take 1 tablet (5  mg total) by mouth daily.   [DISCONTINUED] levothyroxine  (SYNTHROID ) 200 MCG tablet Take 1 tablet (200 mcg total) by mouth daily before breakfast.   [DISCONTINUED] levothyroxine  (SYNTHROID ) 25 MCG tablet Take 1 tablet (25 mcg total) by mouth See admin instructions. Takes along with 200 mcg to total 225 mcg daily   levothyroxine  (SYNTHROID ) 200 MCG tablet Take 1 tablet (200 mcg total) by mouth daily before breakfast.   levothyroxine  (SYNTHROID ) 25 MCG tablet Take 1 tablet (25 mcg total) by mouth See admin instructions. Takes along with 200 mcg to total 225 mcg daily   No facility-administered encounter medications on file as of 04/28/2024.   ALLERGIES: Allergies  Allergen Reactions   Lactose Intolerance (Gi) Other (See Comments)    UPSET STOMACH    Trazodone  And Nefazodone     Nightmares   VACCINATION STATUS: Immunization History  Administered Date(s) Administered   Fluad Quad(high Dose 65+) 03/24/2021   Influenza,inj,Quad PF,6+ Mos 02/17/2014, 03/09/2015, 03/10/2016, 03/27/2017, 03/20/2018, 03/04/2019, 03/15/2020   Moderna Sars-Covid-2 Vaccination 08/03/2019, 09/06/2019   Pneumococcal Polysaccharide-23 01/05/2014   Tdap 12/19/2010   Zoster Recombinant(Shingrix ) 12/17/2019, 02/15/2020    Diabetes He presents for his follow-up diabetic visit. He has type 2 diabetes mellitus. His disease course has been fluctuating. There are no hypoglycemic associated symptoms. Pertinent negatives for hypoglycemia include no headaches, nervousness/anxiousness or tremors. Associated symptoms include fatigue. Pertinent negatives for diabetes include no chest pain, no foot paresthesias, no polydipsia, no polyuria and no weight loss. There are no hypoglycemic complications. Symptoms are stable. Diabetic complications include heart disease, impotence, nephropathy and peripheral neuropathy. Risk factors for coronary artery disease include hypertension, diabetes mellitus, dyslipidemia, male sex, obesity, tobacco  exposure and sedentary lifestyle. Current diabetic treatment includes oral agent (monotherapy). He is compliant with treatment most of the time. His weight is fluctuating minimally. He is following a generally healthy diet. When asked about meal planning, he reported none. He has not had a previous visit with a dietitian. He never participates in exercise. His home blood glucose trend is fluctuating minimally. His breakfast blood glucose range is generally 110-130 mg/dl. (He presents today with no meter or logs to review.  He was not asked to monitor glucose routinely due to safe medication regimen.  His POCT A1c today is 7%, increasing from last visit of 6.7%.  He notes he has had several UTIs since last visit here, going to hyperbaric treatment soon for complications with his prostate CA.) An ACE inhibitor/angiotensin II receptor blocker is not being taken. He does not see a podiatrist.Eye exam is not current.  Thyroid  Problem Presents for follow-up (He was given RAI therapy on March 16, 2014. - he denies family history of thyroid  dysfunction .) visit. Symptoms include fatigue. Patient reports no anxiety, cold intolerance, constipation, diarrhea, heat intolerance, leg swelling, palpitations, tremors, weight gain or weight loss. The symptoms have been stable.    Review of systems  Constitutional: + Minimally fluctuating body weight,  current Body mass index is 38.84 kg/m. , no fatigue, no subjective hyperthermia, no subjective hypothermia Eyes: no blurry vision, no xerophthalmia ENT: no sore throat, no nodules palpated in throat, no dysphagia/odynophagia, no hoarseness Cardiovascular: no chest pain, no shortness of breath, no palpitations, no leg swelling Respiratory: no cough, no shortness of breath Gastrointestinal: no nausea/vomiting/diarrhea Genitourinary: + hematuria Musculoskeletal: no muscle/joint aches Skin: no rashes, no hyperemia Neurological: no tremors, no numbness, no tingling, no  dizziness Psychiatric: no depression, no  anxiety    Objective:    BP 106/74 (BP Location: Left Arm, Patient Position: Sitting, Cuff Size: Large)   Pulse 60   Ht 6' (1.829 m)   Wt 286 lb 6.4 oz (129.9 kg)   BMI 38.84 kg/m   Wt Readings from Last 3 Encounters:  04/28/24 286 lb 6.4 oz (129.9 kg)  04/15/24 290 lb (131.5 kg)  03/21/24 291 lb 9.6 oz (132.3 kg)    BP Readings from Last 3 Encounters:  04/28/24 106/74  04/15/24 109/73  03/21/24 107/67      Physical Exam- Limited  Constitutional:  Body mass index is 38.84 kg/m. , not in acute distress, normal state of mind Eyes:  EOMI, no exophthalmos Musculoskeletal: no gross deformities, strength intact in all four extremities, no gross restriction of joint movements Skin:  no rashes, no hyperemia Neurological: no tremor with outstretched hands   Diabetic Foot Exam - Simple   No data filed     Diabetic Labs (most recent): Lab Results  Component Value Date   HGBA1C 7.0 (A) 04/28/2024   HGBA1C 6.5 08/22/2023   HGBA1C 6.0 (H) 12/18/2022   MICROALBUR 150 03/09/2021   MICROALBUR 1.3 03/15/2020   MICROALBUR 4.1 11/05/2015     Lipid Panel ( most recent) Lipid Panel     Component Value Date/Time   CHOL 149 04/25/2024 0820   TRIG 113 04/25/2024 0820   HDL 51 04/25/2024 0820   CHOLHDL 2.9 04/25/2024 0820   CHOLHDL 2.9 09/12/2021 0458   VLDL 14 09/12/2021 0458   LDLCALC 78 04/25/2024 0820   LDLCALC 217 (H) 09/08/2020 0000    Recent Results (from the past 2160 hours)  CUP PACEART REMOTE DEVICE CHECK     Status: None   Collection Time: 02/06/24  2:00 AM  Result Value Ref Range   Date Time Interrogation Session [id number]    Pulse Generator Manufacturer [employer]    Pulse Gen Model 2240 Assurity    Pulse Gen Serial Number [id number]    Clinic Name [facility]    Implantable Pulse Generator Type Implantable Pulse Generator    Implantable Pulse Generator Implant Date 79839584    Implantable Lead Manufacturer [facility]     Implantable Lead Model 1688TC Tendril SDX    Implantable Lead Serial Number [id number]    Implantable Lead Implant Date 79839584    Implantable Lead Location Detail 1 UNKNOWN    Implantable Lead Location [id number]    Implantable Lead Connection Status [id number]    Implantable Lead Manufacturer [employer]    Implantable Lead Model 1888TC Tendril ST Optim    Implantable Lead Serial Number [id number]    Implantable Lead Implant Date 79839584    Implantable Lead Location Detail 1 UNKNOWN    Implantable Lead Location [id number]    Implantable Lead Connection Status [id number]    Lead Channel Setting Sensing Sensitivity 2.0 mV   Lead Channel Setting Sensing Adaptation Mode Fixed Pacing    Lead Channel Setting Pacing Amplitude 2.0 V   Lead Channel Setting Pacing Pulse Width 0.5 ms   Lead Channel Setting Pacing Amplitude 2.5 V   Lead Channel Status NULL    Lead Channel Impedance Value 410 ohm   Lead Channel Sensing Intrinsic Amplitude 2.7 mV   Lead Channel Pacing Threshold Amplitude 0.75 V   Lead Channel Pacing Threshold Pulse Width 0.5 ms   Lead Channel Status NULL    Lead Channel Impedance Value 610 ohm   Lead Channel Sensing Intrinsic Amplitude 12.0 mV   Lead  Channel Pacing Threshold Amplitude 0.75 V   Lead Channel Pacing Threshold Pulse Width 0.5 ms   Battery Status MOS    Battery Remaining Longevity 25 mo   Battery Remaining Percentage 20.0 %   Battery Voltage 2.9 V   Brady Statistic RA Percent Paced 35.0 %   Brady Statistic RV Percent Paced 6.0 %   Brady Statistic AP VP Percent 4.7 %   Brady Statistic AS VP Percent 1.3 %   Brady Statistic AP VS Percent 32.0 %   Brady Statistic AS VS Percent 61.0 %  Ferritin     Status: None   Collection Time: 02/19/24 12:57 PM  Result Value Ref Range   Ferritin 67 24 - 336 ng/mL    Comment: Performed at [facility], [address], [place], [place] [address]  Iron  and TIBC ([facility] ONLY)     Status: Abnormal   Collection Time: 02/19/24  12:57 PM  Result Value Ref Range   Iron  65 45 - 182 ug/dL   TIBC 620 749 - 549 ug/dL   Saturation Ratios 17 (L) 17.9 - 39.5 %   UIBC 314 ug/dL    Comment: Performed at [facility], [address], [place], [place] [address]  PSA     Status: None   Collection Time: 02/19/24 12:57 PM  Result Value Ref Range   Prostatic Specific Antigen <0.02 0.00 - 4.00 ng/mL    Comment: (NOTE) While PSA levels of <=4.00 ng/ml are reported as reference range, some men with levels below 4.00 ng/ml can have prostate cancer and many men with PSA above 4.00 ng/ml do not have prostate cancer.  Other tests such as free PSA, age specific reference ranges, PSA velocity and PSA doubling time may be helpful especially in men less than 16 years old. Performed at [facility], [address] [place], [place] [address]   Comprehensive metabolic panel     Status: Abnormal   Collection Time: 02/19/24 12:57 PM  Result Value Ref Range   Sodium 140 135 - 145 mmol/L   Potassium 4.0 3.5 - 5.1 mmol/L   Chloride 109 98 - 111 mmol/L   CO2 22 22 - 32 mmol/L   Glucose, Bld 138 (H) 70 - 99 mg/dL    Comment: Glucose reference range applies only to samples taken after fasting for at least 8 hours.   BUN 12 8 - 23 mg/dL   Creatinine, Ser 8.93 0.61 - 1.24 mg/dL   Calcium  8.9 8.9 - 10.3 mg/dL   Total Protein 7.4 6.5 - 8.1 g/dL   Albumin 3.8 3.5 - 5.0 g/dL   AST 31 15 - 41 U/L   ALT 14 0 - 44 U/L   Alkaline Phosphatase 100 38 - 126 U/L   Total Bilirubin 0.8 0.0 - 1.2 mg/dL   GFR, Estimated >39 >39 mL/min    Comment: (NOTE) Calculated using the CKD-EPI Creatinine Equation (2021)    Anion gap 9 5 - 15    Comment: Performed at [facility], [address], [place], [place] [address]  CBC with Differential     Status: Abnormal   Collection Time: 02/19/24 12:57 PM  Result Value Ref Range   WBC 5.6 4.0 - 10.5 K/uL   RBC 4.02 (L) 4.22 - 5.81 MIL/uL   Hemoglobin 13.3 13.0 - 17.0 g/dL   HCT 59.6 60.9 - 47.9 %   MCV  100.2 (H) 80.0 - 100.0 fL   MCH 33.1 26.0 - 34.0 pg  MCHC 33.0 30.0 - 36.0 g/dL   RDW 85.2 88.4 - 84.4 %   Platelets 123 (L) 150 - 400 K/uL   nRBC 0.0 0.0 - 0.2 %   Neutrophils Relative % 63 %   Neutro Abs 3.5 1.7 - 7.7 K/uL   Lymphocytes Relative 24 %   Lymphs Abs 1.4 0.7 - 4.0 K/uL   Monocytes Relative 11 %   Monocytes Absolute 0.6 0.1 - 1.0 K/uL   Eosinophils Relative 1 %   Eosinophils Absolute 0.1 0.0 - 0.5 K/uL   Basophils Relative 1 %   Basophils Absolute 0.1 0.0 - 0.1 K/uL   Immature Granulocytes 0 %   Abs Immature Granulocytes 0.02 0.00 - 0.07 K/uL    Comment: Performed at [facility], [address] [place], [place] [address]  Urinalysis, Routine w reflex microscopic     Status: Abnormal   Collection Time: 02/26/24  2:10 PM  Result Value Ref Range   Specific Gravity, UA 1.020 1.005 - 1.030   pH, UA 6.0 5.0 - 7.5   Color, UA Yellow Yellow   Appearance Ur Clear Clear   Leukocytes,UA Negative Negative   Protein,UA 2+ (A) Negative/Trace   Glucose, UA 3+ (A) Negative   Ketones, UA Negative Negative   RBC, UA Trace (A) Negative   Bilirubin, UA Negative Negative   Urobilinogen, Ur 1.0 0.2 - 1.0 mg/dL   Nitrite, UA Negative Negative   Microscopic Examination See below:     Comment: Microscopic was indicated and was performed.  Microscopic Examination     Status: Abnormal   Collection Time: 02/26/24  2:10 PM   Urine  Result Value Ref Range   WBC, UA None seen 0 - 5 /hpf   RBC, Urine 11-30 (A) 0 - 2 /hpf   Epithelial Cells (non renal) 0-10 0 - 10 /hpf   Bacteria, UA None seen None seen/Few  Comprehensive metabolic panel with GFR     Status: Abnormal   Collection Time: 04/25/24  8:20 AM  Result Value Ref Range   Glucose 167 (H) 70 - 99 mg/dL   BUN 10 8 - 27 mg/dL   Creatinine, Ser 8.82 0.76 - 1.27 mg/dL   eGFR 68 >40 fO/fpw/8.26   BUN/Creatinine Ratio 9 (L) 10 - 24   Sodium 141 134 - 144 mmol/L   Potassium 4.2 3.5 - 5.2 mmol/L   Chloride 106 96 - 106 mmol/L    CO2 19 (L) 20 - 29 mmol/L   Calcium  9.4 8.6 - 10.2 mg/dL   Total Protein 7.2 6.0 - 8.5 g/dL   Albumin 4.1 3.9 - 4.9 g/dL   Globulin, Total 3.1 1.5 - 4.5 g/dL   Bilirubin Total 0.5 0.0 - 1.2 mg/dL   Alkaline Phosphatase 106 47 - 123 IU/L   AST 20 0 - 40 IU/L   ALT 10 0 - 44 IU/L  Lipid panel     Status: None   Collection Time: 04/25/24  8:20 AM  Result Value Ref Range   Cholesterol, Total 149 100 - 199 mg/dL   Triglycerides 886 0 - 149 mg/dL   HDL 51 >60 mg/dL   VLDL Cholesterol Cal 20 5 - 40 mg/dL   LDL Chol Calc (NIH) 78 0 - 99 mg/dL   Chol/HDL Ratio 2.9 0.0 - 5.0 ratio    Comment:  T. Chol/HDL Ratio                                             Men  Women                               1/2 Avg.Risk  3.4    3.3                                   Avg.Risk  5.0    4.4                                2X Avg.Risk  9.6    7.1                                3X Avg.Risk 23.4   11.0   TSH     Status: Abnormal   Collection Time: 04/25/24  8:20 AM  Result Value Ref Range   TSH 8.840 (H) 0.450 - 4.500 uIU/mL  T4, free     Status: None   Collection Time: 04/25/24  8:20 AM  Result Value Ref Range   Free T4 1.44 0.82 - 1.77 ng/dL  HgB J8r     Status: Abnormal   Collection Time: 04/28/24  8:33 AM  Result Value Ref Range   Hemoglobin A1C 7.0 (A) 4.0 - 5.6 %   HbA1c POC (<> result, manual entry)     HbA1c, POC (prediabetic range)     HbA1c, POC (controlled diabetic range)       Assessment & Plan:   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  He is advised to continue his current dose of Levothyroxine  225 mcg po daily before breakfast (close to max weight based calculation of total dose).    - We discussed about the correct intake of his thyroid  hormone, on empty stomach at fasting, with water , separated by at least 30 minutes from breakfast and other medications,  and  separated by more than 4 hours from calcium , iron , multivitamins, acid reflux medications (PPIs). -Patient is made aware of the fact that thyroid  hormone replacement is needed for life, dose to be adjusted by periodic monitoring of thyroid  function tests.  2. Diabetes mellitus type 2 in obese [facility])  He presents today with no meter or logs to review.  He was not asked to monitor glucose routinely due to safe medication regimen.  His POCT A1c today is 7%, increasing from last visit of 6.7%.  He notes he has had several UTIs since last visit here, going to hyperbaric treatment soon for complications with his prostate CA.  - Nutritional counseling repeated/built upon at each appointment.  - The patient admits there is a room for improvement in their diet and drink choices. -  Suggestion is made for the patient to avoid simple carbohydrates from their diet including Cakes, Sweet Desserts / Pastries, Ice Cream, Soda (diet and regular), Sweet Tea, Candies, Chips, Cookies, Sweet Pastries, Store Bought Juices, Alcohol in Excess of 1-2 drinks a day, Artificial Sweeteners, Coffee  Creamer, and Sugar-free Products. This will help patient to have stable blood glucose profile and potentially avoid unintended weight gain.   - I encouraged the patient to switch to unprocessed or minimally processed complex starch and increased protein intake (animal or plant source), fruits, and vegetables.   - Patient is advised to stick to a routine mealtimes to eat 3 meals a day and avoid unnecessary snacks (to snack only to correct hypoglycemia).  -Based on his recent UTIs, will stop Farxiga  as it may be contributing to urinary issues.  Will put him back on Metformin  500 mg ER daily after breakfast.  -He does not need to routinely monitor glucose due to safe medication regimen.  -He will be considered for incretin therapy on subsequent visits.  3. Essential hypertension His blood pressure is controlled to target.  He is  advised to continue meds as prescribed by his PCP.  4.  Hyperlipidemia:  His most recent lipid panel from 04/25/24 shows controlled LDL at 78.  He is advised to continue Simvastatin  40 mg po daily at bedtime.  Side effects and precautions discussed with him.    5. Vitamin D  Deficiency: His most recent vitamin D  level on 06/20/21 shows improvement to 79.22.  He has completed replenishment with ergocalciferol .  No need for additional supplementation at this time.   - I advised patient to maintain close follow up with Shelda Atlas, MD for primary care needs.    I spent  46  minutes in the care of the patient today including review of labs from CMP, Lipids, Thyroid  Function, Hematology (current and previous including abstractions from other facilities); face-to-face time discussing  his blood glucose readings/logs, discussing hypoglycemia and hyperglycemia episodes and symptoms, medications doses, his options of short and long term treatment based on the latest standards of care / guidelines;  discussion about incorporating lifestyle medicine;  and documenting the encounter. Risk reduction counseling performed per USPSTF guidelines to reduce obesity and cardiovascular risk factors.     Please refer to Patient Instructions for Blood Glucose Monitoring and Insulin /Medications Dosing Guide  in media tab for additional information. Please  also refer to  Patient Self Inventory in the Media  tab for reviewed elements of pertinent patient history.  Kalik L Phang participated in the discussions, expressed understanding, and voiced agreement with the above plans.  All questions were answered to his satisfaction. he is encouraged to contact clinic should he have any questions or concerns prior to his return visit.    Follow up plan: Return in about 4 months (around 08/26/2024) for Diabetes F/U with A1c in office, No previsit labs.  Benton Rio, [facility] [address] [place], [place] [address] Phone: [phone] Fax: [phone]   04/28/2024, 9:00 AM

## 2024-04-29 ENCOUNTER — Encounter (HOSPITAL_BASED_OUTPATIENT_CLINIC_OR_DEPARTMENT_OTHER): Admitting: Internal Medicine

## 2024-04-29 DIAGNOSIS — N3041 Irradiation cystitis with hematuria: Secondary | ICD-10-CM | POA: Diagnosis not present

## 2024-04-29 DIAGNOSIS — N3040 Irradiation cystitis without hematuria: Secondary | ICD-10-CM | POA: Diagnosis not present

## 2024-04-29 DIAGNOSIS — L598 Other specified disorders of the skin and subcutaneous tissue related to radiation: Secondary | ICD-10-CM | POA: Diagnosis not present

## 2024-04-29 DIAGNOSIS — Z8546 Personal history of malignant neoplasm of prostate: Secondary | ICD-10-CM | POA: Diagnosis not present

## 2024-04-29 LAB — GLUCOSE, CAPILLARY
Glucose-Capillary: 171 mg/dL — ABNORMAL HIGH (ref 70–99)
Glucose-Capillary: 154 mg/dL — ABNORMAL HIGH (ref 70–99)

## 2024-04-30 ENCOUNTER — Encounter (HOSPITAL_BASED_OUTPATIENT_CLINIC_OR_DEPARTMENT_OTHER): Admitting: Internal Medicine

## 2024-04-30 DIAGNOSIS — N3040 Irradiation cystitis without hematuria: Secondary | ICD-10-CM | POA: Diagnosis not present

## 2024-04-30 DIAGNOSIS — N3041 Irradiation cystitis with hematuria: Secondary | ICD-10-CM

## 2024-04-30 DIAGNOSIS — L598 Other specified disorders of the skin and subcutaneous tissue related to radiation: Secondary | ICD-10-CM | POA: Diagnosis not present

## 2024-04-30 DIAGNOSIS — Z8546 Personal history of malignant neoplasm of prostate: Secondary | ICD-10-CM

## 2024-04-30 LAB — GLUCOSE, CAPILLARY
Glucose-Capillary: 209 mg/dL — ABNORMAL HIGH (ref 70–99)
Glucose-Capillary: 154 mg/dL — ABNORMAL HIGH (ref 70–99)

## 2024-05-05 ENCOUNTER — Encounter (HOSPITAL_BASED_OUTPATIENT_CLINIC_OR_DEPARTMENT_OTHER): Attending: Internal Medicine | Admitting: Internal Medicine

## 2024-05-05 DIAGNOSIS — L598 Other specified disorders of the skin and subcutaneous tissue related to radiation: Secondary | ICD-10-CM | POA: Diagnosis not present

## 2024-05-05 DIAGNOSIS — N3041 Irradiation cystitis with hematuria: Secondary | ICD-10-CM | POA: Diagnosis not present

## 2024-05-05 DIAGNOSIS — Z8546 Personal history of malignant neoplasm of prostate: Secondary | ICD-10-CM | POA: Diagnosis not present

## 2024-05-05 LAB — GLUCOSE, CAPILLARY
Glucose-Capillary: 129 mg/dL — ABNORMAL HIGH (ref 70–99)
Glucose-Capillary: 170 mg/dL — ABNORMAL HIGH (ref 70–99)
Glucose-Capillary: 176 mg/dL — ABNORMAL HIGH (ref 70–99)

## 2024-05-06 ENCOUNTER — Encounter (HOSPITAL_BASED_OUTPATIENT_CLINIC_OR_DEPARTMENT_OTHER): Admitting: Internal Medicine

## 2024-05-06 DIAGNOSIS — Z8546 Personal history of malignant neoplasm of prostate: Secondary | ICD-10-CM | POA: Diagnosis not present

## 2024-05-06 DIAGNOSIS — N3041 Irradiation cystitis with hematuria: Secondary | ICD-10-CM | POA: Diagnosis not present

## 2024-05-06 DIAGNOSIS — L598 Other specified disorders of the skin and subcutaneous tissue related to radiation: Secondary | ICD-10-CM

## 2024-05-06 LAB — CUP PACEART INCLINIC DEVICE CHECK
Battery Remaining Longevity: 25 mo
Battery Voltage: 2.89 V
Brady Statistic RA Percent Paced: 36 %
Brady Statistic RV Percent Paced: 5.7 %
Date Time Interrogation Session: 20251017130900
Implantable Lead Connection Status: 753985
Implantable Lead Connection Status: 753985
Implantable Lead Implant Date: 20160415
Implantable Lead Implant Date: 20160415
Implantable Lead Location: 753859
Implantable Lead Location: 753860
Implantable Pulse Generator Implant Date: 20160415
Lead Channel Impedance Value: 437.5 Ohm
Lead Channel Impedance Value: 612.5 Ohm
Lead Channel Pacing Threshold Amplitude: 0.5 V
Lead Channel Pacing Threshold Amplitude: 0.75 V
Lead Channel Pacing Threshold Pulse Width: 0.5 ms
Lead Channel Pacing Threshold Pulse Width: 0.5 ms
Lead Channel Sensing Intrinsic Amplitude: 12 mV
Lead Channel Sensing Intrinsic Amplitude: 3.9 mV
Lead Channel Setting Pacing Amplitude: 2 V
Lead Channel Setting Pacing Amplitude: 2.5 V
Lead Channel Setting Pacing Pulse Width: 0.5 ms
Lead Channel Setting Sensing Sensitivity: 2 mV
Pulse Gen Model: 2240
Pulse Gen Serial Number: 7756161

## 2024-05-06 LAB — GLUCOSE, CAPILLARY
Glucose-Capillary: 158 mg/dL — ABNORMAL HIGH (ref 70–99)
Glucose-Capillary: 131 mg/dL — ABNORMAL HIGH (ref 70–99)

## 2024-05-07 ENCOUNTER — Encounter (HOSPITAL_BASED_OUTPATIENT_CLINIC_OR_DEPARTMENT_OTHER): Admitting: Internal Medicine

## 2024-05-07 ENCOUNTER — Ambulatory Visit: Payer: Medicare Other

## 2024-05-07 DIAGNOSIS — N3041 Irradiation cystitis with hematuria: Secondary | ICD-10-CM

## 2024-05-07 DIAGNOSIS — Z8546 Personal history of malignant neoplasm of prostate: Secondary | ICD-10-CM

## 2024-05-07 DIAGNOSIS — L598 Other specified disorders of the skin and subcutaneous tissue related to radiation: Secondary | ICD-10-CM

## 2024-05-07 LAB — GLUCOSE, CAPILLARY
Glucose-Capillary: 154 mg/dL — ABNORMAL HIGH (ref 70–99)
Glucose-Capillary: 129 mg/dL — ABNORMAL HIGH (ref 70–99)

## 2024-05-08 ENCOUNTER — Encounter (HOSPITAL_BASED_OUTPATIENT_CLINIC_OR_DEPARTMENT_OTHER): Admitting: Internal Medicine

## 2024-05-08 LAB — CUP PACEART REMOTE DEVICE CHECK
Battery Remaining Longevity: 22 mo
Battery Remaining Percentage: 18 %
Battery Voltage: 2.89 V
Brady Statistic AP VP Percent: 1.9 %
Brady Statistic AP VS Percent: 38 %
Brady Statistic AS VP Percent: 1 %
Brady Statistic AS VS Percent: 59 %
Brady Statistic RA Percent Paced: 38 %
Brady Statistic RV Percent Paced: 2.6 %
Date Time Interrogation Session: 20251203020014
Implantable Lead Connection Status: 753985
Implantable Lead Connection Status: 753985
Implantable Lead Implant Date: 20160415
Implantable Lead Implant Date: 20160415
Implantable Lead Location: 753859
Implantable Lead Location: 753860
Implantable Pulse Generator Implant Date: 20160415
Lead Channel Impedance Value: 410 Ohm
Lead Channel Impedance Value: 600 Ohm
Lead Channel Pacing Threshold Amplitude: 0.5 V
Lead Channel Pacing Threshold Amplitude: 0.75 V
Lead Channel Pacing Threshold Pulse Width: 0.5 ms
Lead Channel Pacing Threshold Pulse Width: 0.5 ms
Lead Channel Sensing Intrinsic Amplitude: 12 mV
Lead Channel Sensing Intrinsic Amplitude: 3.2 mV
Lead Channel Setting Pacing Amplitude: 2 V
Lead Channel Setting Pacing Amplitude: 2.5 V
Lead Channel Setting Pacing Pulse Width: 0.5 ms
Lead Channel Setting Sensing Sensitivity: 2 mV
Pulse Gen Model: 2240
Pulse Gen Serial Number: 7756161

## 2024-05-09 ENCOUNTER — Encounter (HOSPITAL_BASED_OUTPATIENT_CLINIC_OR_DEPARTMENT_OTHER): Admitting: Internal Medicine

## 2024-05-09 DIAGNOSIS — L598 Other specified disorders of the skin and subcutaneous tissue related to radiation: Secondary | ICD-10-CM | POA: Diagnosis not present

## 2024-05-09 DIAGNOSIS — N3041 Irradiation cystitis with hematuria: Secondary | ICD-10-CM | POA: Diagnosis not present

## 2024-05-09 DIAGNOSIS — Z8546 Personal history of malignant neoplasm of prostate: Secondary | ICD-10-CM | POA: Diagnosis not present

## 2024-05-09 LAB — GLUCOSE, CAPILLARY
Glucose-Capillary: 121 mg/dL — ABNORMAL HIGH (ref 70–99)
Glucose-Capillary: 121 mg/dL — ABNORMAL HIGH (ref 70–99)
Glucose-Capillary: 127 mg/dL — ABNORMAL HIGH (ref 70–99)
Glucose-Capillary: 143 mg/dL — ABNORMAL HIGH (ref 70–99)

## 2024-05-11 ENCOUNTER — Ambulatory Visit: Payer: Self-pay | Admitting: Internal Medicine

## 2024-05-12 ENCOUNTER — Encounter (HOSPITAL_BASED_OUTPATIENT_CLINIC_OR_DEPARTMENT_OTHER): Admitting: Internal Medicine

## 2024-05-12 DIAGNOSIS — N3041 Irradiation cystitis with hematuria: Secondary | ICD-10-CM | POA: Diagnosis not present

## 2024-05-12 DIAGNOSIS — L598 Other specified disorders of the skin and subcutaneous tissue related to radiation: Secondary | ICD-10-CM | POA: Diagnosis not present

## 2024-05-12 DIAGNOSIS — Z8546 Personal history of malignant neoplasm of prostate: Secondary | ICD-10-CM | POA: Diagnosis not present

## 2024-05-13 ENCOUNTER — Encounter (HOSPITAL_BASED_OUTPATIENT_CLINIC_OR_DEPARTMENT_OTHER): Admitting: Internal Medicine

## 2024-05-13 DIAGNOSIS — N3041 Irradiation cystitis with hematuria: Secondary | ICD-10-CM | POA: Diagnosis not present

## 2024-05-13 DIAGNOSIS — L598 Other specified disorders of the skin and subcutaneous tissue related to radiation: Secondary | ICD-10-CM | POA: Diagnosis not present

## 2024-05-13 DIAGNOSIS — Z8546 Personal history of malignant neoplasm of prostate: Secondary | ICD-10-CM | POA: Diagnosis not present

## 2024-05-13 LAB — GLUCOSE, CAPILLARY
Glucose-Capillary: 159 mg/dL — ABNORMAL HIGH (ref 70–99)
Glucose-Capillary: 134 mg/dL — ABNORMAL HIGH (ref 70–99)
Glucose-Capillary: 137 mg/dL — ABNORMAL HIGH (ref 70–99)
Glucose-Capillary: 130 mg/dL — ABNORMAL HIGH (ref 70–99)

## 2024-05-14 ENCOUNTER — Encounter (HOSPITAL_BASED_OUTPATIENT_CLINIC_OR_DEPARTMENT_OTHER): Admitting: Internal Medicine

## 2024-05-14 DIAGNOSIS — N3041 Irradiation cystitis with hematuria: Secondary | ICD-10-CM | POA: Diagnosis not present

## 2024-05-14 DIAGNOSIS — Z8546 Personal history of malignant neoplasm of prostate: Secondary | ICD-10-CM

## 2024-05-14 DIAGNOSIS — L598 Other specified disorders of the skin and subcutaneous tissue related to radiation: Secondary | ICD-10-CM

## 2024-05-14 LAB — GLUCOSE, CAPILLARY
Glucose-Capillary: 140 mg/dL — ABNORMAL HIGH (ref 70–99)
Glucose-Capillary: 120 mg/dL — ABNORMAL HIGH (ref 70–99)

## 2024-05-14 NOTE — Progress Notes (Signed)
 Remote PPM Transmission

## 2024-05-15 ENCOUNTER — Other Ambulatory Visit (HOSPITAL_COMMUNITY): Payer: Self-pay

## 2024-05-15 ENCOUNTER — Encounter (HOSPITAL_BASED_OUTPATIENT_CLINIC_OR_DEPARTMENT_OTHER): Admitting: Internal Medicine

## 2024-05-15 DIAGNOSIS — N3041 Irradiation cystitis with hematuria: Secondary | ICD-10-CM

## 2024-05-16 ENCOUNTER — Encounter (HOSPITAL_BASED_OUTPATIENT_CLINIC_OR_DEPARTMENT_OTHER): Admitting: Internal Medicine

## 2024-05-16 DIAGNOSIS — N3041 Irradiation cystitis with hematuria: Secondary | ICD-10-CM | POA: Diagnosis not present

## 2024-05-16 DIAGNOSIS — L598 Other specified disorders of the skin and subcutaneous tissue related to radiation: Secondary | ICD-10-CM | POA: Diagnosis not present

## 2024-05-16 DIAGNOSIS — Z8546 Personal history of malignant neoplasm of prostate: Secondary | ICD-10-CM | POA: Diagnosis not present

## 2024-05-16 LAB — GLUCOSE, CAPILLARY: Glucose-Capillary: 142 mg/dL — ABNORMAL HIGH (ref 70–99)

## 2024-05-18 ENCOUNTER — Other Ambulatory Visit: Payer: Self-pay | Admitting: Urology

## 2024-05-18 DIAGNOSIS — R350 Frequency of micturition: Secondary | ICD-10-CM

## 2024-05-19 ENCOUNTER — Encounter (HOSPITAL_BASED_OUTPATIENT_CLINIC_OR_DEPARTMENT_OTHER): Admitting: Internal Medicine

## 2024-05-19 ENCOUNTER — Other Ambulatory Visit: Payer: Self-pay

## 2024-05-19 NOTE — Progress Notes (Signed)
 Specialty Pharmacy Refill Coordination Note  Peter Dunlap is a 68 y.o. male contacted today regarding refills of specialty medication(s) Apalutamide  (ERLEADA )   Patient requested Delivery   Delivery date: 05/26/24   Verified address: [name] [place]   Medication will be filled on: 05/16/24

## 2024-05-20 ENCOUNTER — Encounter (HOSPITAL_BASED_OUTPATIENT_CLINIC_OR_DEPARTMENT_OTHER): Admitting: Internal Medicine

## 2024-05-20 DIAGNOSIS — L598 Other specified disorders of the skin and subcutaneous tissue related to radiation: Secondary | ICD-10-CM

## 2024-05-20 DIAGNOSIS — Z8546 Personal history of malignant neoplasm of prostate: Secondary | ICD-10-CM

## 2024-05-20 DIAGNOSIS — N3041 Irradiation cystitis with hematuria: Secondary | ICD-10-CM

## 2024-05-20 LAB — GLUCOSE, CAPILLARY
Glucose-Capillary: 191 mg/dL — ABNORMAL HIGH (ref 70–99)
Glucose-Capillary: 181 mg/dL — ABNORMAL HIGH (ref 70–99)
Glucose-Capillary: 143 mg/dL — ABNORMAL HIGH (ref 70–99)
Glucose-Capillary: 128 mg/dL — ABNORMAL HIGH (ref 70–99)

## 2024-05-21 ENCOUNTER — Encounter (HOSPITAL_BASED_OUTPATIENT_CLINIC_OR_DEPARTMENT_OTHER): Admitting: Internal Medicine

## 2024-05-21 DIAGNOSIS — L598 Other specified disorders of the skin and subcutaneous tissue related to radiation: Secondary | ICD-10-CM

## 2024-05-21 DIAGNOSIS — N3041 Irradiation cystitis with hematuria: Secondary | ICD-10-CM | POA: Diagnosis not present

## 2024-05-21 DIAGNOSIS — Z8546 Personal history of malignant neoplasm of prostate: Secondary | ICD-10-CM

## 2024-05-21 LAB — GLUCOSE, CAPILLARY
Glucose-Capillary: 160 mg/dL — ABNORMAL HIGH (ref 70–99)
Glucose-Capillary: 135 mg/dL — ABNORMAL HIGH (ref 70–99)

## 2024-05-22 ENCOUNTER — Encounter (HOSPITAL_BASED_OUTPATIENT_CLINIC_OR_DEPARTMENT_OTHER): Admitting: Internal Medicine

## 2024-05-22 DIAGNOSIS — L598 Other specified disorders of the skin and subcutaneous tissue related to radiation: Secondary | ICD-10-CM | POA: Diagnosis not present

## 2024-05-22 DIAGNOSIS — N3041 Irradiation cystitis with hematuria: Secondary | ICD-10-CM

## 2024-05-22 DIAGNOSIS — Z8546 Personal history of malignant neoplasm of prostate: Secondary | ICD-10-CM | POA: Diagnosis not present

## 2024-05-22 LAB — GLUCOSE, CAPILLARY
Glucose-Capillary: 170 mg/dL — ABNORMAL HIGH (ref 70–99)
Glucose-Capillary: 115 mg/dL — ABNORMAL HIGH (ref 70–99)

## 2024-05-23 ENCOUNTER — Encounter (HOSPITAL_BASED_OUTPATIENT_CLINIC_OR_DEPARTMENT_OTHER): Admitting: Internal Medicine

## 2024-05-23 DIAGNOSIS — Z8546 Personal history of malignant neoplasm of prostate: Secondary | ICD-10-CM

## 2024-05-23 DIAGNOSIS — N3041 Irradiation cystitis with hematuria: Secondary | ICD-10-CM

## 2024-05-23 DIAGNOSIS — L598 Other specified disorders of the skin and subcutaneous tissue related to radiation: Secondary | ICD-10-CM

## 2024-05-23 LAB — GLUCOSE, CAPILLARY
Glucose-Capillary: 226 mg/dL — ABNORMAL HIGH (ref 70–99)
Glucose-Capillary: 103 mg/dL — ABNORMAL HIGH (ref 70–99)
Glucose-Capillary: 138 mg/dL — ABNORMAL HIGH (ref 70–99)

## 2024-05-26 ENCOUNTER — Encounter (HOSPITAL_BASED_OUTPATIENT_CLINIC_OR_DEPARTMENT_OTHER): Admitting: General Surgery

## 2024-05-26 DIAGNOSIS — N3041 Irradiation cystitis with hematuria: Secondary | ICD-10-CM | POA: Diagnosis not present

## 2024-05-26 LAB — GLUCOSE, CAPILLARY
Glucose-Capillary: 181 mg/dL — ABNORMAL HIGH (ref 70–99)
Glucose-Capillary: 149 mg/dL — ABNORMAL HIGH (ref 70–99)

## 2024-05-27 ENCOUNTER — Encounter (HOSPITAL_BASED_OUTPATIENT_CLINIC_OR_DEPARTMENT_OTHER): Admitting: General Surgery

## 2024-05-27 DIAGNOSIS — N3041 Irradiation cystitis with hematuria: Secondary | ICD-10-CM

## 2024-05-27 LAB — GLUCOSE, CAPILLARY
Glucose-Capillary: 146 mg/dL — ABNORMAL HIGH (ref 70–99)
Glucose-Capillary: 137 mg/dL — ABNORMAL HIGH (ref 70–99)

## 2024-05-28 ENCOUNTER — Encounter (HOSPITAL_BASED_OUTPATIENT_CLINIC_OR_DEPARTMENT_OTHER): Admitting: General Surgery

## 2024-05-28 DIAGNOSIS — N3041 Irradiation cystitis with hematuria: Secondary | ICD-10-CM

## 2024-05-28 LAB — GLUCOSE, CAPILLARY
Glucose-Capillary: 140 mg/dL — ABNORMAL HIGH (ref 70–99)
Glucose-Capillary: 134 mg/dL — ABNORMAL HIGH (ref 70–99)

## 2024-06-02 ENCOUNTER — Encounter (HOSPITAL_BASED_OUTPATIENT_CLINIC_OR_DEPARTMENT_OTHER): Admitting: Internal Medicine

## 2024-06-02 ENCOUNTER — Encounter: Payer: Self-pay | Admitting: *Deleted

## 2024-06-02 ENCOUNTER — Inpatient Hospital Stay: Attending: Hematology

## 2024-06-02 DIAGNOSIS — Q8501 Neurofibromatosis, type 1: Secondary | ICD-10-CM | POA: Diagnosis not present

## 2024-06-02 DIAGNOSIS — Z803 Family history of malignant neoplasm of breast: Secondary | ICD-10-CM | POA: Insufficient documentation

## 2024-06-02 DIAGNOSIS — M549 Dorsalgia, unspecified: Secondary | ICD-10-CM | POA: Insufficient documentation

## 2024-06-02 DIAGNOSIS — C61 Malignant neoplasm of prostate: Secondary | ICD-10-CM | POA: Insufficient documentation

## 2024-06-02 DIAGNOSIS — K7460 Unspecified cirrhosis of liver: Secondary | ICD-10-CM | POA: Diagnosis not present

## 2024-06-02 DIAGNOSIS — L598 Other specified disorders of the skin and subcutaneous tissue related to radiation: Secondary | ICD-10-CM

## 2024-06-02 DIAGNOSIS — Z806 Family history of leukemia: Secondary | ICD-10-CM | POA: Diagnosis not present

## 2024-06-02 DIAGNOSIS — M8580 Other specified disorders of bone density and structure, unspecified site: Secondary | ICD-10-CM | POA: Insufficient documentation

## 2024-06-02 DIAGNOSIS — Z801 Family history of malignant neoplasm of trachea, bronchus and lung: Secondary | ICD-10-CM | POA: Diagnosis not present

## 2024-06-02 DIAGNOSIS — K921 Melena: Secondary | ICD-10-CM | POA: Insufficient documentation

## 2024-06-02 DIAGNOSIS — Y842 Radiological procedure and radiotherapy as the cause of abnormal reaction of the patient, or of later complication, without mention of misadventure at the time of the procedure: Secondary | ICD-10-CM | POA: Diagnosis not present

## 2024-06-02 DIAGNOSIS — Z8546 Personal history of malignant neoplasm of prostate: Secondary | ICD-10-CM

## 2024-06-02 DIAGNOSIS — Z808 Family history of malignant neoplasm of other organs or systems: Secondary | ICD-10-CM | POA: Diagnosis not present

## 2024-06-02 DIAGNOSIS — E669 Obesity, unspecified: Secondary | ICD-10-CM | POA: Insufficient documentation

## 2024-06-02 DIAGNOSIS — R5383 Other fatigue: Secondary | ICD-10-CM | POA: Diagnosis not present

## 2024-06-02 DIAGNOSIS — Z79899 Other long term (current) drug therapy: Secondary | ICD-10-CM | POA: Insufficient documentation

## 2024-06-02 DIAGNOSIS — D649 Anemia, unspecified: Secondary | ICD-10-CM | POA: Insufficient documentation

## 2024-06-02 DIAGNOSIS — N3041 Irradiation cystitis with hematuria: Secondary | ICD-10-CM

## 2024-06-02 DIAGNOSIS — M545 Low back pain, unspecified: Secondary | ICD-10-CM | POA: Insufficient documentation

## 2024-06-02 DIAGNOSIS — Z8049 Family history of malignant neoplasm of other genital organs: Secondary | ICD-10-CM | POA: Diagnosis not present

## 2024-06-02 LAB — CBC WITH DIFFERENTIAL/PLATELET
Abs Immature Granulocytes: 0.02 K/uL (ref 0.00–0.07)
Basophils Absolute: 0.1 K/uL (ref 0.0–0.1)
Basophils Relative: 1 %
Eosinophils Absolute: 0.1 K/uL (ref 0.0–0.5)
Eosinophils Relative: 2 %
HCT: 34.5 % — ABNORMAL LOW (ref 39.0–52.0)
Hemoglobin: 11.2 g/dL — ABNORMAL LOW (ref 13.0–17.0)
Immature Granulocytes: 0 %
Lymphocytes Relative: 24 %
Lymphs Abs: 1.4 K/uL (ref 0.7–4.0)
MCH: 31.9 pg (ref 26.0–34.0)
MCHC: 32.5 g/dL (ref 30.0–36.0)
MCV: 98.3 fL (ref 80.0–100.0)
Monocytes Absolute: 0.6 K/uL (ref 0.1–1.0)
Monocytes Relative: 10 %
Neutro Abs: 3.7 K/uL (ref 1.7–7.7)
Neutrophils Relative %: 63 %
Platelets: 114 K/uL — ABNORMAL LOW (ref 150–400)
RBC: 3.51 MIL/uL — ABNORMAL LOW (ref 4.22–5.81)
RDW: 15.3 % (ref 11.5–15.5)
WBC: 5.9 K/uL (ref 4.0–10.5)
nRBC: 0 % (ref 0.0–0.2)

## 2024-06-02 LAB — COMPREHENSIVE METABOLIC PANEL WITH GFR
ALT: 5 U/L (ref 0–44)
AST: 20 U/L (ref 15–41)
Albumin: 4.1 g/dL (ref 3.5–5.0)
Alkaline Phosphatase: 93 U/L (ref 38–126)
Anion gap: 16 — ABNORMAL HIGH (ref 5–15)
BUN: 10 mg/dL (ref 8–23)
CO2: 18 mmol/L — ABNORMAL LOW (ref 22–32)
Calcium: 9 mg/dL (ref 8.9–10.3)
Chloride: 107 mmol/L (ref 98–111)
Creatinine, Ser: 1.09 mg/dL (ref 0.61–1.24)
GFR, Estimated: >60 mL/min
Glucose, Bld: 133 mg/dL — ABNORMAL HIGH (ref 70–99)
Potassium: 4.1 mmol/L (ref 3.5–5.1)
Sodium: 141 mmol/L (ref 135–145)
Total Bilirubin: 0.5 mg/dL (ref 0.0–1.2)
Total Protein: 7.1 g/dL (ref 6.5–8.1)

## 2024-06-02 LAB — IRON AND TIBC
Iron: 53 ug/dL (ref 45–182)
Saturation Ratios: 14 % — ABNORMAL LOW (ref 17.9–39.5)
TIBC: 389 ug/dL (ref 250–450)
UIBC: 336 ug/dL

## 2024-06-02 LAB — FERRITIN: Ferritin: 32 ng/mL (ref 24–336)

## 2024-06-02 LAB — GLUCOSE, CAPILLARY
Glucose-Capillary: 149 mg/dL — ABNORMAL HIGH (ref 70–99)
Glucose-Capillary: 123 mg/dL — ABNORMAL HIGH (ref 70–99)

## 2024-06-02 LAB — PSA: Prostatic Specific Antigen: <0.02 ng/mL (ref 0.00–4.00)

## 2024-06-03 ENCOUNTER — Encounter (HOSPITAL_BASED_OUTPATIENT_CLINIC_OR_DEPARTMENT_OTHER): Admitting: Internal Medicine

## 2024-06-03 DIAGNOSIS — N3041 Irradiation cystitis with hematuria: Secondary | ICD-10-CM | POA: Diagnosis not present

## 2024-06-03 DIAGNOSIS — L598 Other specified disorders of the skin and subcutaneous tissue related to radiation: Secondary | ICD-10-CM | POA: Diagnosis not present

## 2024-06-03 DIAGNOSIS — Z8546 Personal history of malignant neoplasm of prostate: Secondary | ICD-10-CM

## 2024-06-03 LAB — GLUCOSE, CAPILLARY
Glucose-Capillary: 152 mg/dL — ABNORMAL HIGH (ref 70–99)
Glucose-Capillary: 115 mg/dL — ABNORMAL HIGH (ref 70–99)

## 2024-06-04 ENCOUNTER — Inpatient Hospital Stay

## 2024-06-04 ENCOUNTER — Inpatient Hospital Stay (HOSPITAL_BASED_OUTPATIENT_CLINIC_OR_DEPARTMENT_OTHER): Admitting: Oncology

## 2024-06-04 ENCOUNTER — Other Ambulatory Visit: Payer: Self-pay

## 2024-06-04 ENCOUNTER — Encounter (HOSPITAL_BASED_OUTPATIENT_CLINIC_OR_DEPARTMENT_OTHER): Admitting: Internal Medicine

## 2024-06-04 ENCOUNTER — Encounter: Payer: Self-pay | Admitting: Oncology

## 2024-06-04 VITALS — BP 111/67 | HR 68 | Temp 97.1°F | Resp 18 | Wt 296.0 lb

## 2024-06-04 VITALS — BP 120/69 | HR 60 | Temp 96.7°F | Resp 18

## 2024-06-04 DIAGNOSIS — C61 Malignant neoplasm of prostate: Secondary | ICD-10-CM

## 2024-06-04 DIAGNOSIS — N3041 Irradiation cystitis with hematuria: Secondary | ICD-10-CM | POA: Diagnosis not present

## 2024-06-04 DIAGNOSIS — D649 Anemia, unspecified: Secondary | ICD-10-CM | POA: Diagnosis not present

## 2024-06-04 DIAGNOSIS — K921 Melena: Secondary | ICD-10-CM | POA: Diagnosis not present

## 2024-06-04 DIAGNOSIS — Z191 Hormone sensitive malignancy status: Secondary | ICD-10-CM

## 2024-06-04 DIAGNOSIS — Z8546 Personal history of malignant neoplasm of prostate: Secondary | ICD-10-CM | POA: Diagnosis not present

## 2024-06-04 DIAGNOSIS — M8580 Other specified disorders of bone density and structure, unspecified site: Secondary | ICD-10-CM | POA: Diagnosis not present

## 2024-06-04 DIAGNOSIS — L598 Other specified disorders of the skin and subcutaneous tissue related to radiation: Secondary | ICD-10-CM | POA: Diagnosis not present

## 2024-06-04 LAB — GLUCOSE, CAPILLARY
Glucose-Capillary: 184 mg/dL — ABNORMAL HIGH (ref 70–99)
Glucose-Capillary: 120 mg/dL — ABNORMAL HIGH (ref 70–99)

## 2024-06-04 MED ORDER — ZOLEDRONIC ACID 4 MG/100ML IV SOLN
4.0000 mg | Freq: Once | INTRAVENOUS | Status: AC
  Administered 2024-06-04: 4 mg via INTRAVENOUS
  Filled 2024-06-04: qty 100

## 2024-06-04 MED ORDER — LEUPROLIDE ACETATE (6 MONTH) 45 MG ~~LOC~~ KIT: 45.0000 mg | PACK | Freq: Once | SUBCUTANEOUS | Status: DC

## 2024-06-04 MED ORDER — LEUPROLIDE ACETATE (6 MONTH) 45 MG ~~LOC~~ KIT
45.0000 mg | PACK | Freq: Once | SUBCUTANEOUS | Status: AC
  Administered 2024-06-04: 45 mg via SUBCUTANEOUS

## 2024-06-04 MED ORDER — SODIUM CHLORIDE 0.9 % IV SOLN: INTRAVENOUS | Status: DC

## 2024-06-04 NOTE — Progress Notes (Signed)
 "  [facility] OFFICE PROGRESS NOTE  Shelda Atlas, MD  ASSESSMENT & PLAN:    Assessment & Plan Normocytic anemia - Received INFeD  on 12/03/2023 1 g.  Tolerated well. -Repeat lab work from 06/02/2024 shows hemoglobin of 11.2, platelet count 114, iron  saturation is 14% and ferritin 32. - Slight decline in both hemoglobin and ferritin levels.  Would recommend 1 additional dose of INFeD . - IDA likely secondary to hematuria and rectal bleeding due to hemorrhoids. -She is due for a colonoscopy next month and will touch base with GI.  He is unsure of if he had a colonoscopy since January 2023 with [facility] medical.  He will get back to us  regarding this. -Will recheck these labs in 3 months. Osteopenia, unspecified location - Continue vitamin D  50,000 units every other week. - He had dental extractions 2 months ago.  - Patient received his first dose of Zometa  on 12/03/2023 with great tolerance. -Same level is WNL.  Proceed with Zometa .  He will receive this every 6 months.   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 Most recent PSA was less than 0.02. He is currently on Eligard  and receives this every 6 months.  He is also on apalutamide  240 mg by mouth daily and tolerating well. He is due for an injection today. Blood in stool Reports a day of bright red blood per rectum likely secondary to hemorrhoids. Patient had anoscopy in January 2023 and is due for repeat in January 2026. He is unsure if he had a colonoscopy since his colonoscopy in 2023 with [facility] medical.  He will get back to us  regarding this. Continue to monitor.  Orders Placed This Encounter  Procedures   CBC with Differential    Standing Status:   Future    Expected Date:   09/02/2024    Expiration Date:   12/01/2024   Comprehensive metabolic panel    Standing Status:   Future    Expected Date:   09/02/2024    Expiration Date:   12/01/2024   Iron  and TIBC ([facility]  [facility] ONLY)    Standing Status:   Future    Expected Date:   09/02/2024    Expiration Date:   12/01/2024   Ferritin    Standing Status:   Future    Expected Date:   09/02/2024    Expiration Date:   12/01/2024   CBC with Differential    Standing Status:   Future    Expected Date:   12/02/2024    Expiration Date:   03/02/2025   Comprehensive metabolic panel    Standing Status:   Future    Expected Date:   12/02/2024    Expiration Date:   03/02/2025   PSA    Standing Status:   Future    Expected Date:   12/02/2024    Expiration Date:   03/02/2025   Iron  and TIBC ([facility] ONLY)    Standing Status:   Future    Expected Date:   12/02/2024    Expiration Date:   03/02/2025   Ferritin    Standing Status:   Future    Expected Date:   12/02/2024    Expiration Date:   03/02/2025    INTERVAL HISTORY: Patient returns for follow-up.  He last received Eligard  on 12/03/2023.  He is currently on apalutamide  with good tolerance.  He received 1 g INFeD  on 12/03/2023 as well.  He is here to review most recent lab  results.  Denies any interval hospitalizations, surgeries or changes to his baseline health.  He was seen for cystitis with gross hematuria on 01/04/2024 and treated with Bactrim .  He presented back to the next day for hematuria and Keflex  was added.  He was eventually diagnosed with radiation cystitis and has been undergoing hyperbaric chamber 5 days/week which seems to be helping with the hematuria.  Reports he did have an episode of bright red blood per rectum after straining with a bowel movement last week.  Reports he does suffer from hemorrhoids which is likely contributing.  Reports this is intermittent.  Reports appetite of 100% energy levels are 75%.  He has low back pain and rates it an 8 out of 10.  Overall feeling pretty good.  We reviewed ferritin, iron  panel, PSA, CMP and CBC.  SUMMARY OF HEMATOLOGIC HISTORY: Oncology History Overview Note  1.  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 -Status post IMRT, 40 sessions completed on 06/14/2016 by Dr. Patrcia. -PSA 0.7 (10/15/2018), 0.8 (02/18/2019), 1.4 (December 2020), 1.4 (June 2021) -Prostate biopsy on 02/10/2020-1 out of 12 cores from left mid lateral region positive for adenocarcinoma. -CTAP on 03/09/2020 with newly enlarged left iliac lymph nodes measuring 1.1 x 0.9 cm.  Hepatic steatosis with early signs of cirrhosis. -F-18 PSMA PET scan on 04/12/2020 showed radiotracer activity associated with enlarged left external iliac lymph node 9 mm with SUV 7.6.  Small left common iliac lymph node 6 mm, SUV 8.6.  Left periaortic lymph node 5 mm, SUV 6.3.  Lymph node between IVC and aorta at the level of right renal hilum, SUV 8.6.  Lymph node deep to the IVC measuring 6 mm, SUV 5.4.  No skeletal metastasis. 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 - Firmagon  started on 04/21/2020 - Apalutamide  240 milligrams daily started around 04/22/2020.   2.  Social/family history: -He is a retired corporate investment banker.  He lives at home with his wife.  He plays golf.  He quit smoking in 2015, smoked 1 pack/day for more than 20 years. -Sister has CML.  Maternal grandmother had breast cancer.  Maternal uncle had lung cancer.  2 maternal cousins had tongue cancer and uterine cancer.   3.  Cirrhosis: -Prior imaging showed cirrhosis of the liver with normal spleen.   Prostate cancer (HCC)  03/10/2016 Initial Diagnosis   Prostate cancer (HCC)   05/23/2020 Genetic Testing   Negative genetic testing:  No pathogenic variants detected on the [facility] Common Hereditary Cancers Panel + Prostate Cancer HRR Panel. A variant of uncertain significance (VUS) was detected in the NF1 gene called c.1178A>G. The report date is 05/23/2020.  The Common Hereditary Cancers  Panel offered by [facility] includes sequencing and/or deletion duplication testing of the following 47 genes: APC, ATM, AXIN2, BARD1, BMPR1A, BRCA1, BRCA2, BRIP1, CDH1, CDK4, CDKN2A (p14ARF), CDKN2A (p16INK4a), CHEK2, CTNNA1, DICER1, EPCAM (Deletion/duplication testing only), GREM1 (promoter region deletion/duplication testing only), KIT, MEN1, MLH1, MSH2, MSH3, MSH6, MUTYH, NBN, NF1, NTHL1, PALB2, PDGFRA, PMS2, POLD1, POLE, PTEN, RAD50, RAD51C, RAD51D, SDHB, SDHC, SDHD, SMAD4, SMARCA4. STK11, TP53, TSC1, TSC2, and VHL.  The following genes were evaluated for sequence changes only: SDHA and HOXB13 c.251G>A variant only. The Prostate Cancer HRR Panel offered by [facility] includes sequencing and/or deletion duplication analysis of the following 10 genes: ATM, BARD1, BRCA1, BRCA2, BRIP1, CHEK2, FANCL, PALB2, RAD51C, RAD51D.  CBC    Component Value Date/Time   WBC 5.9 06/02/2024 1332   RBC 3.51 (L) 06/02/2024 1332   HGB 11.2 (L) 06/02/2024 1332   HGB 15.8 04/17/2016 1153   HCT 34.5 (L) 06/02/2024 1332   HCT 46.1 04/17/2016 1153   PLT 114 (L) 06/02/2024 1332   PLT 231 04/17/2016 1153   MCV 98.3 06/02/2024 1332   MCV 93.3 04/17/2016 1153   MCH 31.9 06/02/2024 1332   MCHC 32.5 06/02/2024 1332   RDW 15.3 06/02/2024 1332   RDW 14.0 04/17/2016 1153   LYMPHSABS 1.4 06/02/2024 1332   LYMPHSABS 2.5 04/17/2016 1153   MONOABS 0.6 06/02/2024 1332   MONOABS 2.7 (H) 04/17/2016 1153   EOSABS 0.1 06/02/2024 1332   EOSABS 0.0 04/17/2016 1153   BASOSABS 0.1 06/02/2024 1332   BASOSABS 0.1 04/17/2016 1153       Latest Ref Rng & Units 06/02/2024    1:32 PM 04/25/2024    8:20 AM 02/19/2024   12:57 PM  CMP  Glucose 70 - 99 mg/dL 866  832  861   BUN 8 - 23 mg/dL 10  10  12    Creatinine 0.61 - 1.24 mg/dL 8.90  8.82  8.93   Sodium 135 - 145 mmol/L 141  141  140   Potassium 3.5 - 5.1 mmol/L 4.1  4.2  4.0   Chloride 98 - 111 mmol/L 107  106  109   CO2 22 - 32 mmol/L 18  19  22    Calcium  8.9 - 10.3 mg/dL  9.0  9.4  8.9   Total Protein 6.5 - 8.1 g/dL 7.1  7.2  7.4   Total Bilirubin 0.0 - 1.2 mg/dL 0.5  0.5  0.8   Alkaline Phos 38 - 126 U/L 93  106  100   AST 15 - 41 U/L 20  20  31    ALT 0 - 44 U/L 5  10  14       Lab Results  Component Value Date   FERRITIN 32 06/02/2024   VITAMINB12 1,525 (H) 12/18/2022    Vitals:   06/04/24 1122 06/04/24 1125  BP: (!) 147/61 111/67  Pulse: 68   Resp: 18   Temp: (!) 97.1 F (36.2 C)   SpO2: 100%     Review of System:  Review of Systems  Constitutional:  Positive for malaise/fatigue.  Musculoskeletal:  Positive for back pain.    Physical Exam: Physical Exam Constitutional:      Appearance: Normal appearance. He is obese.  HENT:     Head: Normocephalic and atraumatic.  Eyes:     Pupils: Pupils are equal, round, and reactive to light.  Cardiovascular:     Rate and Rhythm: Normal rate and regular rhythm.     Heart sounds: Normal heart sounds. No murmur heard. Pulmonary:     Effort: Pulmonary effort is normal.     Breath sounds: Normal breath sounds. No wheezing.  Abdominal:     General: Bowel sounds are normal. There is no distension.     Palpations: Abdomen is soft.     Tenderness: There is no abdominal tenderness.  Musculoskeletal:        General: Normal range of motion.     Cervical back: Normal range of motion.  Skin:    General: Skin is warm and dry.     Findings: No rash.  Neurological:     Mental Status: He is alert and oriented to person, place, and time.  Gait: Gait is intact.  Psychiatric:        Mood and Affect: Mood and affect normal.        Cognition and Memory: Memory normal.        Judgment: Judgment normal.      I spent 20 minutes dedicated to the care of this patient (face-to-face and non-face-to-face) on the date of the encounter to include what is described in the assessment and plan.,  Delon Hope, NP 06/04/2024 1:18 PM "

## 2024-06-04 NOTE — Assessment & Plan Note (Addendum)
-   Continue vitamin D  50,000 units every other week. - He had dental extractions 2 months ago.  - Patient received his first dose of Zometa  on 12/03/2023 with great tolerance. -Same level is WNL.  Proceed with Zometa .  He will receive this every 6 months.

## 2024-06-04 NOTE — Progress Notes (Signed)
 Patient tolerated therapy with no complaints voiced. Labs reviewed. Side effects with management reviewed with understanding verbalized. Peripheral IV site clean and dry with no bruising or swelling noted at site. Good blood return noted before and after administration of therapy. Band aid applied. Patient left in satisfactory condition with VSS and no s/s of distress noted.    Peter Dunlap [employer]

## 2024-06-04 NOTE — Assessment & Plan Note (Addendum)
 Followed by urology. Most recent PSA was less than 0.02. He is currently on Eligard  and receives this every 6 months.  He is also on apalutamide  240 mg by mouth daily and tolerating well. He is due for an injection today.

## 2024-06-04 NOTE — Progress Notes (Signed)
 Patient tolerated injection with no complaints voiced. Site clean and dry with no bruising or swelling noted at site. See MAR for details. Band aid applied.  Patient stable during and after injection. VSS with discharge and left in satisfactory condition with no s/s of distress noted.

## 2024-06-04 NOTE — Assessment & Plan Note (Addendum)
-   Received INFeD  on 12/03/2023 1 g.  Tolerated well. -Repeat lab work from 06/02/2024 shows hemoglobin of 11.2, platelet count 114, iron  saturation is 14% and ferritin 32. - Slight decline in both hemoglobin and ferritin levels.  Would recommend 1 additional dose of INFeD . - IDA likely secondary to hematuria and rectal bleeding due to hemorrhoids. -She is due for a colonoscopy next month and will touch base with GI.  He is unsure of if he had a colonoscopy since January 2023 with [facility] medical.  He will get back to us  regarding this. -Will recheck these labs in 3 months.

## 2024-06-04 NOTE — Assessment & Plan Note (Signed)
 Followed by urology. He is currently on Eligard  and receives this every 6 months. He is due for an injection today.

## 2024-06-04 NOTE — Assessment & Plan Note (Signed)
 Reports a day of bright red blood per rectum likely secondary to hemorrhoids. Patient had anoscopy in January 2023 and is due for repeat in January 2026. He is unsure if he had a colonoscopy since his colonoscopy in 2023 with [facility] medical.  He will get back to us  regarding this. Continue to monitor.

## 2024-06-04 NOTE — Patient Instructions (Signed)
 [facility] [place] - A DEPT OF [place]  Discharge Instructions: Thank you for choosing [facility] to provide your oncology and hematology care.  If you have a lab appointment with the Cancer Center - please note that after April 8th, 2024, all labs will be drawn in the cancer center.  You do not have to check in or register with the main entrance as you have in the past but will complete your check-in in the cancer center.  Wear comfortable clothing and clothing appropriate for easy access to any Portacath or PICC line.   We strive to give you quality time with your provider. You may need to reschedule your appointment if you arrive late (15 or more minutes).  Arriving late affects you and other patients whose appointments are after yours.  Also, if you miss three or more appointments without notifying the office, you may be dismissed from the clinic at the provider's discretion.      For prescription refill requests, have your pharmacy contact our office and allow 72 hours for refills to be completed.    Today you received the following Eligard , return as scheduled.   To help prevent nausea and vomiting after your treatment, we encourage you to take your nausea medication as directed.  BELOW ARE SYMPTOMS THAT SHOULD BE REPORTED IMMEDIATELY: *FEVER GREATER THAN 100.4 F (38 C) OR HIGHER *CHILLS OR SWEATING *NAUSEA AND VOMITING THAT IS NOT CONTROLLED WITH YOUR NAUSEA MEDICATION *UNUSUAL SHORTNESS OF BREATH *UNUSUAL BRUISING OR BLEEDING *URINARY PROBLEMS (pain or burning when urinating, or frequent urination) *BOWEL PROBLEMS (unusual diarrhea, constipation, pain near the anus) TENDERNESS IN MOUTH AND THROAT WITH OR WITHOUT PRESENCE OF ULCERS (sore throat, sores in mouth, or a toothache) UNUSUAL RASH, SWELLING OR PAIN  UNUSUAL VAGINAL DISCHARGE OR ITCHING   Items with * indicate a potential emergency and should be followed up as soon as possible or  go to the Emergency Department if any problems should occur.  Please show the CHEMOTHERAPY ALERT CARD or IMMUNOTHERAPY ALERT CARD at check-in to the Emergency Department and triage nurse.  Should you have questions after your visit or need to cancel or reschedule your appointment, please contact [facility] [place] - A DEPT OF [facility] [phone]  and follow the prompts.  Office hours are 8:00 a.m. to 4:30 p.m. Monday - Friday. Please note that voicemails left after 4:00 p.m. may not be returned until the following business day.  We are closed weekends and major holidays. You have access to a nurse at all times for urgent questions. Please call the main number to the clinic [phone] and follow the prompts.  For any non-urgent questions, you may also contact your provider using MyChart. We now offer e-Visits for anyone 48 and older to request care online for non-urgent symptoms. For details visit [email or web address].   Also download the MyChart app! Go to the app store, search MyChart, open the app, select [place], and log in with your MyChart username and password.

## 2024-06-09 ENCOUNTER — Encounter (HOSPITAL_BASED_OUTPATIENT_CLINIC_OR_DEPARTMENT_OTHER): Attending: Internal Medicine | Admitting: Internal Medicine

## 2024-06-09 DIAGNOSIS — Z8546 Personal history of malignant neoplasm of prostate: Secondary | ICD-10-CM | POA: Insufficient documentation

## 2024-06-09 DIAGNOSIS — N3041 Irradiation cystitis with hematuria: Secondary | ICD-10-CM | POA: Insufficient documentation

## 2024-06-09 DIAGNOSIS — L598 Other specified disorders of the skin and subcutaneous tissue related to radiation: Secondary | ICD-10-CM | POA: Insufficient documentation

## 2024-06-09 LAB — GLUCOSE, CAPILLARY
Glucose-Capillary: 148 mg/dL — ABNORMAL HIGH (ref 70–99)
Glucose-Capillary: 127 mg/dL — ABNORMAL HIGH (ref 70–99)

## 2024-06-10 ENCOUNTER — Encounter (HOSPITAL_BASED_OUTPATIENT_CLINIC_OR_DEPARTMENT_OTHER): Admitting: Internal Medicine

## 2024-06-10 DIAGNOSIS — N3041 Irradiation cystitis with hematuria: Secondary | ICD-10-CM | POA: Diagnosis not present

## 2024-06-10 LAB — GLUCOSE, CAPILLARY
Glucose-Capillary: 172 mg/dL — ABNORMAL HIGH (ref 70–99)
Glucose-Capillary: 148 mg/dL — ABNORMAL HIGH (ref 70–99)

## 2024-06-11 ENCOUNTER — Encounter (HOSPITAL_BASED_OUTPATIENT_CLINIC_OR_DEPARTMENT_OTHER): Admitting: Internal Medicine

## 2024-06-11 DIAGNOSIS — N3041 Irradiation cystitis with hematuria: Secondary | ICD-10-CM | POA: Diagnosis not present

## 2024-06-11 LAB — GLUCOSE, CAPILLARY
Glucose-Capillary: 174 mg/dL — ABNORMAL HIGH (ref 70–99)
Glucose-Capillary: 174 mg/dL — ABNORMAL HIGH (ref 70–99)

## 2024-06-12 ENCOUNTER — Encounter (HOSPITAL_BASED_OUTPATIENT_CLINIC_OR_DEPARTMENT_OTHER): Admitting: Internal Medicine

## 2024-06-12 DIAGNOSIS — N3041 Irradiation cystitis with hematuria: Secondary | ICD-10-CM | POA: Diagnosis not present

## 2024-06-12 LAB — GLUCOSE, CAPILLARY
Glucose-Capillary: 132 mg/dL — ABNORMAL HIGH (ref 70–99)
Glucose-Capillary: 115 mg/dL — ABNORMAL HIGH (ref 70–99)

## 2024-06-13 ENCOUNTER — Other Ambulatory Visit (HOSPITAL_COMMUNITY): Payer: Self-pay

## 2024-06-13 ENCOUNTER — Other Ambulatory Visit: Payer: Self-pay

## 2024-06-13 ENCOUNTER — Other Ambulatory Visit: Payer: Self-pay | Admitting: Oncology

## 2024-06-13 ENCOUNTER — Encounter (HOSPITAL_BASED_OUTPATIENT_CLINIC_OR_DEPARTMENT_OTHER): Admitting: Internal Medicine

## 2024-06-13 DIAGNOSIS — N3041 Irradiation cystitis with hematuria: Secondary | ICD-10-CM | POA: Diagnosis not present

## 2024-06-13 LAB — GLUCOSE, CAPILLARY
Glucose-Capillary: 190 mg/dL — ABNORMAL HIGH (ref 70–99)
Glucose-Capillary: 149 mg/dL — ABNORMAL HIGH (ref 70–99)

## 2024-06-13 MED ORDER — APALUTAMIDE 60 MG PO TABS
ORAL_TABLET | ORAL | 3 refills | Status: AC
  Filled 2024-06-13: qty 120, 30d supply, fill #0

## 2024-06-13 NOTE — Progress Notes (Signed)
 Specialty Pharmacy Refill Coordination Note  RANDEL HARGENS is a 69 y.o. male, patients wife was contacted today regarding refills of specialty medication(s) Apalutamide  (ERLEADA )   Patient requested Delivery   Delivery date: 06/27/24   Verified address: [name] [place]   Medication will be filled on: 06/26/24   This fill date is pending response to refill request from provider. Patient is aware and if they have not received fill by intended date they must follow up with pharmacy.

## 2024-06-16 ENCOUNTER — Encounter (HOSPITAL_BASED_OUTPATIENT_CLINIC_OR_DEPARTMENT_OTHER): Admitting: Internal Medicine

## 2024-06-16 DIAGNOSIS — Z8546 Personal history of malignant neoplasm of prostate: Secondary | ICD-10-CM

## 2024-06-16 DIAGNOSIS — N3041 Irradiation cystitis with hematuria: Secondary | ICD-10-CM | POA: Diagnosis not present

## 2024-06-16 DIAGNOSIS — L598 Other specified disorders of the skin and subcutaneous tissue related to radiation: Secondary | ICD-10-CM | POA: Diagnosis not present

## 2024-06-16 LAB — GLUCOSE, CAPILLARY
Glucose-Capillary: 166 mg/dL — ABNORMAL HIGH (ref 70–99)
Glucose-Capillary: 129 mg/dL — ABNORMAL HIGH (ref 70–99)

## 2024-06-17 ENCOUNTER — Encounter (HOSPITAL_BASED_OUTPATIENT_CLINIC_OR_DEPARTMENT_OTHER): Admitting: Internal Medicine

## 2024-06-17 DIAGNOSIS — Z8546 Personal history of malignant neoplasm of prostate: Secondary | ICD-10-CM

## 2024-06-17 DIAGNOSIS — N3041 Irradiation cystitis with hematuria: Secondary | ICD-10-CM | POA: Diagnosis not present

## 2024-06-17 DIAGNOSIS — L598 Other specified disorders of the skin and subcutaneous tissue related to radiation: Secondary | ICD-10-CM

## 2024-06-17 LAB — GLUCOSE, CAPILLARY: Glucose-Capillary: 157 mg/dL — ABNORMAL HIGH (ref 70–99)

## 2024-06-18 ENCOUNTER — Encounter (HOSPITAL_BASED_OUTPATIENT_CLINIC_OR_DEPARTMENT_OTHER): Admitting: Internal Medicine

## 2024-06-18 DIAGNOSIS — N3041 Irradiation cystitis with hematuria: Secondary | ICD-10-CM

## 2024-06-18 DIAGNOSIS — L598 Other specified disorders of the skin and subcutaneous tissue related to radiation: Secondary | ICD-10-CM | POA: Diagnosis not present

## 2024-06-18 DIAGNOSIS — Z8546 Personal history of malignant neoplasm of prostate: Secondary | ICD-10-CM

## 2024-06-18 LAB — GLUCOSE, CAPILLARY
Glucose-Capillary: 127 mg/dL — ABNORMAL HIGH (ref 70–99)
Glucose-Capillary: 190 mg/dL — ABNORMAL HIGH (ref 70–99)
Glucose-Capillary: 118 mg/dL — ABNORMAL HIGH (ref 70–99)

## 2024-06-19 ENCOUNTER — Encounter (HOSPITAL_BASED_OUTPATIENT_CLINIC_OR_DEPARTMENT_OTHER): Admitting: Internal Medicine

## 2024-06-19 DIAGNOSIS — N3041 Irradiation cystitis with hematuria: Secondary | ICD-10-CM | POA: Diagnosis not present

## 2024-06-19 DIAGNOSIS — Z8546 Personal history of malignant neoplasm of prostate: Secondary | ICD-10-CM | POA: Diagnosis not present

## 2024-06-19 DIAGNOSIS — L598 Other specified disorders of the skin and subcutaneous tissue related to radiation: Secondary | ICD-10-CM | POA: Diagnosis not present

## 2024-06-19 LAB — GLUCOSE, CAPILLARY
Glucose-Capillary: 149 mg/dL — ABNORMAL HIGH (ref 70–99)
Glucose-Capillary: 131 mg/dL — ABNORMAL HIGH (ref 70–99)

## 2024-06-20 ENCOUNTER — Encounter (HOSPITAL_BASED_OUTPATIENT_CLINIC_OR_DEPARTMENT_OTHER): Admitting: Internal Medicine

## 2024-06-20 DIAGNOSIS — N3041 Irradiation cystitis with hematuria: Secondary | ICD-10-CM | POA: Diagnosis not present

## 2024-06-20 DIAGNOSIS — L598 Other specified disorders of the skin and subcutaneous tissue related to radiation: Secondary | ICD-10-CM

## 2024-06-20 DIAGNOSIS — Z8546 Personal history of malignant neoplasm of prostate: Secondary | ICD-10-CM

## 2024-06-20 LAB — GLUCOSE, CAPILLARY
Glucose-Capillary: 167 mg/dL — ABNORMAL HIGH (ref 70–99)
Glucose-Capillary: 126 mg/dL — ABNORMAL HIGH (ref 70–99)

## 2024-06-23 ENCOUNTER — Encounter (HOSPITAL_BASED_OUTPATIENT_CLINIC_OR_DEPARTMENT_OTHER): Admitting: Internal Medicine

## 2024-06-23 DIAGNOSIS — L598 Other specified disorders of the skin and subcutaneous tissue related to radiation: Secondary | ICD-10-CM | POA: Diagnosis not present

## 2024-06-23 DIAGNOSIS — N3041 Irradiation cystitis with hematuria: Secondary | ICD-10-CM

## 2024-06-23 DIAGNOSIS — Z8546 Personal history of malignant neoplasm of prostate: Secondary | ICD-10-CM

## 2024-06-23 LAB — GLUCOSE, CAPILLARY
Glucose-Capillary: 137 mg/dL — ABNORMAL HIGH (ref 70–99)
Glucose-Capillary: 134 mg/dL — ABNORMAL HIGH (ref 70–99)

## 2024-06-24 ENCOUNTER — Encounter (HOSPITAL_BASED_OUTPATIENT_CLINIC_OR_DEPARTMENT_OTHER): Admitting: Internal Medicine

## 2024-06-24 DIAGNOSIS — L598 Other specified disorders of the skin and subcutaneous tissue related to radiation: Secondary | ICD-10-CM | POA: Diagnosis not present

## 2024-06-24 DIAGNOSIS — Z8546 Personal history of malignant neoplasm of prostate: Secondary | ICD-10-CM | POA: Diagnosis not present

## 2024-06-24 DIAGNOSIS — N3041 Irradiation cystitis with hematuria: Secondary | ICD-10-CM | POA: Diagnosis not present

## 2024-06-24 LAB — GLUCOSE, CAPILLARY
Glucose-Capillary: 170 mg/dL — ABNORMAL HIGH (ref 70–99)
Glucose-Capillary: 121 mg/dL — ABNORMAL HIGH (ref 70–99)

## 2024-06-25 ENCOUNTER — Encounter (HOSPITAL_BASED_OUTPATIENT_CLINIC_OR_DEPARTMENT_OTHER): Admitting: Internal Medicine

## 2024-06-25 ENCOUNTER — Other Ambulatory Visit: Payer: Self-pay | Admitting: Student

## 2024-06-25 ENCOUNTER — Other Ambulatory Visit: Payer: Self-pay

## 2024-06-26 ENCOUNTER — Encounter (HOSPITAL_BASED_OUTPATIENT_CLINIC_OR_DEPARTMENT_OTHER): Admitting: Internal Medicine

## 2024-06-26 ENCOUNTER — Other Ambulatory Visit: Payer: Self-pay

## 2024-06-26 DIAGNOSIS — Z8546 Personal history of malignant neoplasm of prostate: Secondary | ICD-10-CM | POA: Diagnosis not present

## 2024-06-26 DIAGNOSIS — N3041 Irradiation cystitis with hematuria: Secondary | ICD-10-CM | POA: Diagnosis not present

## 2024-06-26 DIAGNOSIS — L598 Other specified disorders of the skin and subcutaneous tissue related to radiation: Secondary | ICD-10-CM | POA: Diagnosis not present

## 2024-06-26 LAB — GLUCOSE, CAPILLARY
Glucose-Capillary: 182 mg/dL — ABNORMAL HIGH (ref 70–99)
Glucose-Capillary: 106 mg/dL — ABNORMAL HIGH (ref 70–99)

## 2024-06-27 ENCOUNTER — Encounter (HOSPITAL_BASED_OUTPATIENT_CLINIC_OR_DEPARTMENT_OTHER): Admitting: Internal Medicine

## 2024-06-27 DIAGNOSIS — N3041 Irradiation cystitis with hematuria: Secondary | ICD-10-CM

## 2024-06-27 DIAGNOSIS — L598 Other specified disorders of the skin and subcutaneous tissue related to radiation: Secondary | ICD-10-CM

## 2024-06-27 DIAGNOSIS — Z8546 Personal history of malignant neoplasm of prostate: Secondary | ICD-10-CM

## 2024-06-27 LAB — GLUCOSE, CAPILLARY
Glucose-Capillary: 131 mg/dL — ABNORMAL HIGH (ref 70–99)
Glucose-Capillary: 142 mg/dL — ABNORMAL HIGH (ref 70–99)

## 2024-06-29 NOTE — Progress Notes (Unsigned)
 " Impression/Assessment:  1.  Castrate sensitive metastatic prostate cancer, on leuprolide , apalutamide  with excellent PSA response that is durable.  Most recent PSA close to 0  2.  Osteopenia, on Prolia.  Last bone density study was over 2 years ago  3.  BPH with lower urinary tract symptoms, on silodosin , Gemtesa and Solifenacin , still doing well, emptying well.  4.  Radiation cystitis, recurrent, it is time for HBO treatment  Plan: 1.  I think it is time to proceed with referral to the wound center in [facility] for H DL  2.  I will see back in 3 months  History of Present Illness: Peter Dunlap is here today for follow-up of prostate cancer and BPH w/ LUTS:    Prostate cancer:    He underwent TRUS/Bx on 8.15.2017. At that time, PSA was 9.09. Prostatic volume was 25 cc. 10/12 cores came back positive for adenocarcinoma as follows: 1 core revealed GS 3+3 5 cores revealed GS 3+4 4 cores revealed GS 4+3   He completed IMRT, 40 sessions, on 1.10.2018   8.24.2021: Repeat TRUS/Bx for elevating PSA trend following definitive Rx. 1/12 cores-from the left mid lateral region-came back positive for adenocarcinoma.  Grading was difficult due to his prior radiation.     10.15.2021: -CT A/P  with newly enlarged left iliac lymph nodes measuring 1.1 x 0.9 cm.  Hepatic steatosis with early signs of cirrhosis.   04/12/2020 -F-18 PSMA PET scan on showed radiotracer activity associated with enlarged left external iliac lymph node 9 mm with SUV 7.6.  Small left common iliac lymph node 6 mm, SUV 8.6.  Left periaortic lymph node 5 mm, SUV 6.3.  Lymph node between IVC and aorta at the level of right renal hilum, SUV 8.6.  Lymph node deep to the IVC measuring 6 mm, SUV 5.4.  No skeletal metastasis.   11.17.2021: Began ADT with Firmagon  and Erleada .   7.5.2022: Continues Firmagon  and Erleada   Most recent PSA is less than 0.1.  Testosterone  level 5.   11.9.2022: PSA < 0.01. T level castrate.  Continues  Firmagon  and Erleada    7.18.2023: PSA is 0.01, testosterone  level still castrate.  Continues on Erleada  and Firmagon .    11.21.2023: Most recent PSA in September less than 0.01, nadir level for him.  He did experience hematuria in September-passed a clot,   5.21.2024: PSA 2 months ago was still undetectable.  Still with urinary frequency, urgency and urgency incontinence as well as nocturia.  On Rapaflo  twice a day as well as Solifenacin  10 mg.   8.7.2024:  He was switched to leuprolide  45 mg from his monthly Firmagon  on the 18th of last month.  He is much more comfortable with this.  Most recent PSA was undetectable.  At his last visit he was started on Gemtesa in addition to his Solifenacin .    11.11.2024: He follows regularly with Dr. Rogers.  He is on leuprolide  45 mg every 6 months, last given in July.  Also on apalutamide  and Prolia.  Most recent PSA 2 months ago is less than 0.1.  He is having no hot flashes.  He takes vitamin D  supplements.  He does get some exercise-walking on a regular basis.  No bony pain.     BPH   11.12.2024: He denies significant lower urinary tract symptoms.  He is having no blood in his urine.  He is on Solifenacin  and alfuzosin   Gross hematuria:  He underwent cystoscopy, bladder biopsy and fulguration of bleeders on  06/21/2023.  Bladder biopsies revealed benign findings.  He has not noticed any blood in his urine since the procedure.  He is having spraying with urination.  He is having worsening urgency incontinence.    7.15.2025: He is voiding well.  He has no gross hematuria.  Spraying of stream is still present but less than several months ago.  He feels like he is emptying adequately.  He has not experienced gross hematuria.  8.5.2025: He comes in today for follow-up.  On the first of this month he began with recurrent gross painless hematuria.  Went to the urgent care then the emergency room.  Urine culture was negative, he was placed on  antibiotics.  Temporarily taken off of aspirin  which he has restarted.  Hematuria has cleared.  9.23.2025: Here for follow-up.  Still having intermittent hematuria but for the most part most of the time his urine is yellow.  Recent PSA last week less than 0.02.  1.27.2026: PSA <0.02 Past Surgical History:  Procedure Laterality Date   BRAIN SURGERY  2015   hematoma evacuation   BURR HOLE Right 04/13/2014   Procedure: SOLMON HERRLICH;  Surgeon: Victory DELENA Gunnels, MD;  Location: [facility];  Service: Neurosurgery;  Laterality: Right;   CAROTID ENDARTERECTOMY Right Feb. 25, 2010    CEA   COLONOSCOPY WITH PROPOFOL  N/A 06/24/2021   Procedure: COLONOSCOPY WITH PROPOFOL ;  Surgeon: Rollin Dover, MD;  Location: [facility];  Service: Endoscopy;  Laterality: N/A;   CORONARY ANGIOPLASTY WITH STENT PLACEMENT  12/03/2013   LAD 90%-->0% W/ Promus Premier DES 3.0 mm x 16 mm, CFX OK, RCA 40%, EF 70-75%   CYSTOSCOPY WITH FULGERATION N/A 06/21/2023   Procedure: CYSTOSCOPY WITH FULGERATION and clot evacuation;  Surgeon: Matilda Senior, MD;  Location: [facility];  Service: Urology;  Laterality: N/A;   LEFT ATRIAL APPENDAGE OCCLUSION N/A 08/05/2015   Procedure: LEFT ATRIAL APPENDAGE OCCLUSION;  Surgeon: Lynwood Rakers, MD;  Location: [facility];  Service: Cardiovascular;  Laterality: N/A;   LEFT HEART CATH AND CORONARY ANGIOGRAPHY N/A 09/13/2021   Procedure: LEFT HEART CATH AND CORONARY ANGIOGRAPHY;  Surgeon: Wendel Lurena POUR, MD;  Location: [facility];  Service: Cardiovascular;  Laterality: N/A;   LEFT HEART CATHETERIZATION WITH CORONARY ANGIOGRAM Left 12/03/2013   Procedure: LEFT HEART CATHETERIZATION WITH CORONARY ANGIOGRAM;  Surgeon: Alm LELON Clay, MD;  Location: [facility] CATH LAB;  Service: Cardiovascular;  Laterality: Left;   LEFT HEART CATHETERIZATION WITH CORONARY ANGIOGRAM N/A 01/26/2014   Procedure: LEFT HEART CATHETERIZATION WITH CORONARY ANGIOGRAM;  Surgeon: Candyce GORMAN Reek, MD;   Location: [facility] CATH LAB;  Service: Cardiovascular;  Laterality: N/A;   LEFT HEART CATHETERIZATION WITH CORONARY ANGIOGRAM N/A 08/03/2014   Procedure: LEFT HEART CATHETERIZATION WITH CORONARY ANGIOGRAM;  Surgeon: Lonni JONETTA Cash, MD;  Location: [facility] CATH LAB;  Service: Cardiovascular;  Laterality: N/A;   PERCUTANEOUS CORONARY STENT INTERVENTION (PCI-S)  12/03/2013   Procedure: PERCUTANEOUS CORONARY STENT INTERVENTION (PCI-S);  Surgeon: Alm LELON Clay, MD;  Location: [facility] CATH LAB;  Service: Cardiovascular;;   PERMANENT PACEMAKER INSERTION N/A 09/18/2014   Procedure: PERMANENT PACEMAKER INSERTION;  Surgeon: Danelle LELON Birmingham, MD;  Location: [facility] CATH LAB;  Service: Cardiovascular;  Laterality: N/A;   POLYPECTOMY  06/24/2021   Procedure: POLYPECTOMY;  Surgeon: Rollin Dover, MD;  Location: [facility];  Service: Endoscopy;;   RADIOFREQUENCY ABLATION  2005   for PSVT   TEE WITHOUT CARDIOVERSION N/A 07/27/2015   Procedure: TRANSESOPHAGEAL ECHOCARDIOGRAM (TEE);  Surgeon: Redell  GORMAN Shallow, MD;  Location: [facility];  Service: Cardiovascular;  Laterality: N/A;   TEE WITHOUT CARDIOVERSION N/A 09/15/2015   Procedure: TRANSESOPHAGEAL ECHOCARDIOGRAM (TEE);  Surgeon: Aleene JINNY Passe, MD;  Location: [facility] ENDOSCOPY;  Service: Cardiovascular;  Laterality: N/A;    Home Medications:  Allergies as of 07/01/2024       Reactions   Lactose Intolerance (gi) Other (See Comments)   UPSET STOMACH   Trazodone  And Nefazodone    Nightmares        Medication List        Accurate as of June 29, 2024  9:56 AM. If you have any questions, ask your nurse or doctor.          acetaminophen  500 MG tablet Commonly known as: TYLENOL  Take 500-1,000 mg by mouth every 6 (six) hours as needed for moderate pain (pain score 4-6).   allopurinol  100 MG tablet Commonly known as: ZYLOPRIM  TAKE 1 TABLET(100 MG) BY MOUTH DAILY   Erleada  60 MG tablet Generic drug: apalutamide  TAKE 4 TABLETS (240 MG TOTAL) BY MOUTH DAILY. MAY  BE TAKEN WITH OR WITHOUT FOOD. SWALLOW TABLETS WHOLE.   Farxiga  5 MG Tabs tablet Generic drug: dapagliflozin  propanediol Take 5 mg by mouth daily.   finasteride  5 MG tablet Commonly known as: PROSCAR  Take 1 tablet (5 mg total) by mouth daily.   fluticasone  50 MCG/ACT nasal spray Commonly known as: FLONASE  Place 2 sprays into both nostrils daily.   Gemtesa 75 MG Tabs Generic drug: Vibegron Take 75 mg by mouth every Monday, Wednesday, and Friday.   isosorbide  mononitrate 30 MG 24 hr tablet Commonly known as: IMDUR  TAKE 1 TABLET(30 MG) BY MOUTH DAILY   levothyroxine  25 MCG tablet Commonly known as: SYNTHROID  Take 1 tablet (25 mcg total) by mouth See admin instructions. Takes along with 200 mcg to total 225 mcg daily   levothyroxine  200 MCG tablet Commonly known as: SYNTHROID  Take 1 tablet (200 mcg total) by mouth daily before breakfast.   Linzess 145 MCG Caps capsule Generic drug: linaclotide Take 145 mcg by mouth every morning.   loratadine  10 MG tablet Commonly known as: CLARITIN  TAKE 1 TABLET(10 MG) BY MOUTH DAILY AS NEEDED FOR ALLERGIES   metFORMIN  500 MG 24 hr tablet Commonly known as: GLUCOPHAGE -XR Take 1 tablet (500 mg total) by mouth daily with breakfast.   naloxone 4 MG/0.1ML Liqd nasal spray kit Commonly known as: NARCAN Place 4 mg into the nose once as needed (accidental overdose).   nitroGLYCERIN  0.4 MG SL tablet Commonly known as: NITROSTAT  PLACE 1 TABLET UNDER THE TONGUE EVERY 5 MINUTES AS NEEDED FOR CHEST AIN CALL 911 AT THIRD DOSE IN 15 MINUTES   OneTouch Delica Plus Lancet30G Misc daily.   OneTouch Verio test strip Generic drug: glucose blood daily.   oxyCODONE -acetaminophen  10-325 MG tablet Commonly known as: PERCOCET Take 1 tablet by mouth every 8 (eight) hours as needed for pain.   pantoprazole  40 MG tablet Commonly known as: PROTONIX  TAKE 1 TABLET(40 MG) BY MOUTH DAILY   phenazopyridine  100 MG tablet Commonly known as: PYRIDIUM  Take  1 tablet (100 mg total) by mouth 3 (three) times daily with meals.   rosuvastatin  40 MG tablet Commonly known as: CRESTOR  Take 40 mg by mouth every evening.   silodosin  8 MG Caps capsule Commonly known as: RAPAFLO  TAKE 1 CAPSULE BY MOUTH TWICE DAILY   solifenacin  10 MG tablet Commonly known as: VESICARE  TAKE 1 TABLET(10 MG) BY MOUTH DAILY   sotalol  80 MG tablet  Commonly known as: BETAPACE  Take 1 tablet (80 mg total) by mouth 2 (two) times daily. NEED OV.   Vitamin B 12 500 MCG Tabs Take 500 mcg by mouth in the morning.   Vitamin D  (Ergocalciferol ) 1.25 MG (50000 UNIT) Caps capsule Commonly known as: DRISDOL  TAKE 1 CAPSULE BY MOUTH EVERY 7 DAYS        Allergies:  Allergies  Allergen Reactions   Lactose Intolerance (Gi) Other (See Comments)    UPSET STOMACH    Trazodone  And Nefazodone     Nightmares    Family History  Problem Relation Age of Onset   Hypertension Mother        Cerebrovascular disease   Diabetes Mother    Coronary artery disease Father 2   Diabetes type II Father    Hypertension Father    Heart attack Father    Diabetes Brother    Hypertension Brother    Diabetes Sister    Hypertension Sister    Heart attack Sister 48   Leukemia Sister 68       CML   Breast cancer Maternal Grandmother        dx 58s   Lung cancer Maternal Uncle        dx >50, smoker   Cancer Cousin        mouth cancer, dx 49s, no smoking/chew tobacco hx (maternal 1st cousin)   Ovarian cancer Cousin        dx <50 (maternal 1st cousin)   Stomach cancer Cousin        dx 23s (maternal 1st cousin)   Cancer Cousin        type unknown to pt, dx >50 (paternal 1st cousin)    Social History:  reports that he quit smoking about 11 years ago. His smoking use included cigarettes. He started smoking about 51 years ago. He has a 40 pack-year smoking history. He has never used smokeless tobacco. He reports that he does not currently use alcohol. He reports that he does not currently  use drugs after having used the following drugs: Cocaine.  ROS: A complete review of systems was performed.  All systems are negative except for pertinent findings as noted.    Prior GU notes reviewed  Recent ER notes reviewed  Recent urine culture reviewed  I have also reviewed notes from referring/previous physicians--Dr. Charmain last note reviewed  I have independently reviewed prior imaging--bone density study from June 2023 revealed osteopenia  Urinalysis today reveals microscopic hematuria, no evidence of infection     "

## 2024-06-30 ENCOUNTER — Encounter (HOSPITAL_BASED_OUTPATIENT_CLINIC_OR_DEPARTMENT_OTHER): Admitting: Internal Medicine

## 2024-07-01 ENCOUNTER — Ambulatory Visit: Admitting: Urology

## 2024-07-01 ENCOUNTER — Encounter (HOSPITAL_BASED_OUTPATIENT_CLINIC_OR_DEPARTMENT_OTHER): Admitting: Internal Medicine

## 2024-07-01 VITALS — BP 127/76 | HR 88

## 2024-07-01 DIAGNOSIS — N3040 Irradiation cystitis without hematuria: Secondary | ICD-10-CM

## 2024-07-01 DIAGNOSIS — Z79899 Other long term (current) drug therapy: Secondary | ICD-10-CM

## 2024-07-01 DIAGNOSIS — Z8546 Personal history of malignant neoplasm of prostate: Secondary | ICD-10-CM | POA: Diagnosis not present

## 2024-07-01 DIAGNOSIS — L598 Other specified disorders of the skin and subcutaneous tissue related to radiation: Secondary | ICD-10-CM | POA: Diagnosis not present

## 2024-07-01 DIAGNOSIS — Z191 Hormone sensitive malignancy status: Secondary | ICD-10-CM

## 2024-07-01 DIAGNOSIS — M818 Other osteoporosis without current pathological fracture: Secondary | ICD-10-CM

## 2024-07-01 DIAGNOSIS — C61 Malignant neoplasm of prostate: Secondary | ICD-10-CM

## 2024-07-01 DIAGNOSIS — N3041 Irradiation cystitis with hematuria: Secondary | ICD-10-CM | POA: Diagnosis not present

## 2024-07-01 DIAGNOSIS — N401 Enlarged prostate with lower urinary tract symptoms: Secondary | ICD-10-CM | POA: Diagnosis not present

## 2024-07-01 DIAGNOSIS — M8580 Other specified disorders of bone density and structure, unspecified site: Secondary | ICD-10-CM

## 2024-07-01 DIAGNOSIS — R310 Gross hematuria: Secondary | ICD-10-CM

## 2024-07-01 DIAGNOSIS — R350 Frequency of micturition: Secondary | ICD-10-CM

## 2024-07-01 LAB — GLUCOSE, CAPILLARY
Glucose-Capillary: 118 mg/dL — ABNORMAL HIGH (ref 70–99)
Glucose-Capillary: 121 mg/dL — ABNORMAL HIGH (ref 70–99)

## 2024-07-01 NOTE — Progress Notes (Signed)
 " Impression/Assessment:  1.  Castrate sensitive metastatic prostate cancer, on leuprolide , apalutamide  with excellent PSA response that is durable.  PSA remains undetectable  2.  Osteopenia, on Prolia.   3.  BPH with lower urinary tract symptoms, on silodosin , Gemtesa and Solifenacin , still doing well, emptying well.  4.  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  He is on finasteride   Plan: 1.  I let him know that he has had good success with his HBO  2.  I will have him see Dr. Sherrilee in 4 months for routine check  3.  Continue same medical regimen  History of Present Illness: Peter Dunlap is here today for follow-up of prostate cancer and BPH w/ LUTS:    Prostate cancer:    He underwent TRUS/Bx on 8.15.2017. At that time, PSA was 9.09. Prostatic volume was 25 cc. 10/12 cores came back positive for adenocarcinoma as follows: 1 core revealed GS 3+3 5 cores revealed GS 3+4 4 cores revealed GS 4+3   He completed IMRT, 40 sessions, on 1.10.2018   8.24.2021: Repeat TRUS/Bx for elevating PSA trend following definitive Rx. 1/12 cores-from the left mid lateral region-came back positive for adenocarcinoma.  Grading was difficult due to his prior radiation.     10.15.2021: -CT A/P  with newly enlarged left iliac lymph nodes measuring 1.1 x 0.9 cm.  Hepatic steatosis with early signs of cirrhosis.   04/12/2020 -F-18 PSMA PET scan on showed radiotracer activity associated with enlarged left external iliac lymph node 9 mm with SUV 7.6.  Small left common iliac lymph node 6 mm, SUV 8.6.  Left periaortic lymph node 5 mm, SUV 6.3.  Lymph node between IVC and aorta at the level of right renal hilum, SUV 8.6.  Lymph node deep to the IVC measuring 6 mm, SUV 5.4.  No skeletal metastasis.   11.17.2021: Began ADT with Firmagon  and Erleada .   7.5.2022: Continues Firmagon  and Erleada   Most recent PSA is less than 0.1.  Testosterone  level 5.   11.9.2022: PSA < 0.01. T level castrate.   Continues Firmagon  and Erleada    7.18.2023: PSA is 0.01, testosterone  level still castrate.  Continues on Erleada  and Firmagon .    11.21.2023: Most recent PSA in September less than 0.01, nadir level for him.  He did experience hematuria in September-passed a clot,   5.21.2024: PSA 2 months ago was still undetectable.  Still with urinary frequency, urgency and urgency incontinence as well as nocturia.  On Rapaflo  twice a day as well as Solifenacin  10 mg.   8.7.2024:  He was switched to leuprolide  45 mg from his monthly Firmagon  on the 18th of last month.  He is much more comfortable with this.  Most recent PSA was undetectable.  At his last visit he was started on Gemtesa in addition to his Solifenacin .    11.11.2024: He follows regularly with Dr. Rogers.  He is on leuprolide  45 mg every 6 months, last given in July.  Also on apalutamide  and Prolia.  Most recent PSA 2 months ago is less than 0.1.  He is having no hot flashes.  He takes vitamin D  supplements.  He does get some exercise-walking on a regular basis.  No bony pain.     BPH   11.12.2024: He denies significant lower urinary tract symptoms.  He is having no blood in his urine.  He is on Solifenacin  and alfuzosin   Gross hematuria:  He underwent cystoscopy, bladder biopsy and fulguration of bleeders  on 06/21/2023.  Bladder biopsies revealed benign findings.  He has not noticed any blood in his urine since the procedure.  He is having spraying with urination.  He is having worsening urgency incontinence.    7.15.2025: He is voiding well.  He has no gross hematuria.  Spraying of stream is still present but less than several months ago.  He feels like he is emptying adequately.  He has not experienced gross hematuria.  8.5.2025: He comes in today for follow-up.  On the first of this month he began with recurrent gross painless hematuria.  Went to the urgent care then the emergency room.  Urine culture was negative, he was placed on  antibiotics.  Temporarily taken off of aspirin  which he has restarted.  Hematuria has cleared.  9.23.2025: Here for follow-up.  Still having intermittent hematuria but for the most part most of the time his urine is yellow.  Recent PSA last week less than 0.02.  1.27.2026: PSA <0.02.  He still sees medical oncology.  He is getting his leuprolide  and apalutamide  there.  He has been getting HBO for his radiation cystitis.  He has rare blood in his urine now.  No significant lower urinary tract symptoms.  He has 3 more treatments to go.   Past Surgical History:  Procedure Laterality Date   BRAIN SURGERY  2015   hematoma evacuation   BURR HOLE Right 04/13/2014   Procedure: SOLMON HERRLICH;  Surgeon: Victory DELENA Gunnels, MD;  Location: [facility];  Service: Neurosurgery;  Laterality: Right;   CAROTID ENDARTERECTOMY Right Feb. 25, 2010    CEA   COLONOSCOPY WITH PROPOFOL  N/A 06/24/2021   Procedure: COLONOSCOPY WITH PROPOFOL ;  Surgeon: Rollin Dover, MD;  Location: [facility];  Service: Endoscopy;  Laterality: N/A;   CORONARY ANGIOPLASTY WITH STENT PLACEMENT  12/03/2013   LAD 90%-->0% W/ Promus Premier DES 3.0 mm x 16 mm, CFX OK, RCA 40%, EF 70-75%   CYSTOSCOPY WITH FULGERATION N/A 06/21/2023   Procedure: CYSTOSCOPY WITH FULGERATION and clot evacuation;  Surgeon: Matilda Senior, MD;  Location: [facility];  Service: Urology;  Laterality: N/A;   LEFT ATRIAL APPENDAGE OCCLUSION N/A 08/05/2015   Procedure: LEFT ATRIAL APPENDAGE OCCLUSION;  Surgeon: Lynwood Rakers, MD;  Location: [facility];  Service: Cardiovascular;  Laterality: N/A;   LEFT HEART CATH AND CORONARY ANGIOGRAPHY N/A 09/13/2021   Procedure: LEFT HEART CATH AND CORONARY ANGIOGRAPHY;  Surgeon: Wendel Lurena POUR, MD;  Location: [facility];  Service: Cardiovascular;  Laterality: N/A;   LEFT HEART CATHETERIZATION WITH CORONARY ANGIOGRAM Left 12/03/2013   Procedure: LEFT HEART CATHETERIZATION WITH CORONARY ANGIOGRAM;  Surgeon:  Alm LELON Clay, MD;  Location: [facility] CATH LAB;  Service: Cardiovascular;  Laterality: Left;   LEFT HEART CATHETERIZATION WITH CORONARY ANGIOGRAM N/A 01/26/2014   Procedure: LEFT HEART CATHETERIZATION WITH CORONARY ANGIOGRAM;  Surgeon: Candyce GORMAN Reek, MD;  Location: [facility] CATH LAB;  Service: Cardiovascular;  Laterality: N/A;   LEFT HEART CATHETERIZATION WITH CORONARY ANGIOGRAM N/A 08/03/2014   Procedure: LEFT HEART CATHETERIZATION WITH CORONARY ANGIOGRAM;  Surgeon: Lonni JONETTA Cash, MD;  Location: [facility] CATH LAB;  Service: Cardiovascular;  Laterality: N/A;   PERCUTANEOUS CORONARY STENT INTERVENTION (PCI-S)  12/03/2013   Procedure: PERCUTANEOUS CORONARY STENT INTERVENTION (PCI-S);  Surgeon: Alm LELON Clay, MD;  Location: [facility] CATH LAB;  Service: Cardiovascular;;   PERMANENT PACEMAKER INSERTION N/A 09/18/2014   Procedure: PERMANENT PACEMAKER INSERTION;  Surgeon: Danelle LELON Birmingham, MD;  Location: [facility] CATH LAB;  Service: Cardiovascular;  Laterality: N/A;   POLYPECTOMY  06/24/2021   Procedure: POLYPECTOMY;  Surgeon: Rollin Dover, MD;  Location: [facility];  Service: Endoscopy;;   RADIOFREQUENCY ABLATION  2005   for PSVT   TEE WITHOUT CARDIOVERSION N/A 07/27/2015   Procedure: TRANSESOPHAGEAL ECHOCARDIOGRAM (TEE);  Surgeon: Redell GORMAN Shallow, MD;  Location: [facility] ENDOSCOPY;  Service: Cardiovascular;  Laterality: N/A;   TEE WITHOUT CARDIOVERSION N/A 09/15/2015   Procedure: TRANSESOPHAGEAL ECHOCARDIOGRAM (TEE);  Surgeon: Aleene JINNY Passe, MD;  Location: [facility] ENDOSCOPY;  Service: Cardiovascular;  Laterality: N/A;    Home Medications:  Allergies as of 07/01/2024       Reactions   Lactose Intolerance (gi) Other (See Comments)   UPSET STOMACH   Trazodone  And Nefazodone    Nightmares        Medication List        Accurate as of July 01, 2024  4:05 PM. If you have any questions, ask your nurse or doctor.          acetaminophen  500 MG tablet Commonly known as: TYLENOL  Take 500-1,000 mg by mouth every 6  (six) hours as needed for moderate pain (pain score 4-6).   allopurinol  100 MG tablet Commonly known as: ZYLOPRIM  TAKE 1 TABLET(100 MG) BY MOUTH DAILY   Erleada  60 MG tablet Generic drug: apalutamide  TAKE 4 TABLETS (240 MG TOTAL) BY MOUTH DAILY. MAY BE TAKEN WITH OR WITHOUT FOOD. SWALLOW TABLETS WHOLE.   Farxiga  5 MG Tabs tablet Generic drug: dapagliflozin  propanediol Take 5 mg by mouth daily.   finasteride  5 MG tablet Commonly known as: PROSCAR  Take 1 tablet (5 mg total) by mouth daily.   fluticasone  50 MCG/ACT nasal spray Commonly known as: FLONASE  Place 2 sprays into both nostrils daily.   Gemtesa 75 MG Tabs Generic drug: Vibegron Take 75 mg by mouth every Monday, Wednesday, and Friday.   isosorbide  mononitrate 30 MG 24 hr tablet Commonly known as: IMDUR  TAKE 1 TABLET(30 MG) BY MOUTH DAILY   levothyroxine  25 MCG tablet Commonly known as: SYNTHROID  Take 1 tablet (25 mcg total) by mouth See admin instructions. Takes along with 200 mcg to total 225 mcg daily   levothyroxine  200 MCG tablet Commonly known as: SYNTHROID  Take 1 tablet (200 mcg total) by mouth daily before breakfast.   Linzess 145 MCG Caps capsule Generic drug: linaclotide Take 145 mcg by mouth every morning.   loratadine  10 MG tablet Commonly known as: CLARITIN  TAKE 1 TABLET(10 MG) BY MOUTH DAILY AS NEEDED FOR ALLERGIES   metFORMIN  500 MG 24 hr tablet Commonly known as: GLUCOPHAGE -XR Take 1 tablet (500 mg total) by mouth daily with breakfast.   naloxone 4 MG/0.1ML Liqd nasal spray kit Commonly known as: NARCAN Place 4 mg into the nose once as needed (accidental overdose).   nitroGLYCERIN  0.4 MG SL tablet Commonly known as: NITROSTAT  PLACE 1 TABLET UNDER THE TONGUE EVERY 5 MINUTES AS NEEDED FOR CHEST AIN CALL 911 AT THIRD DOSE IN 15 MINUTES   OneTouch Delica Plus Lancet30G Misc daily.   OneTouch Verio test strip Generic drug: glucose blood daily.   oxyCODONE -acetaminophen  10-325 MG  tablet Commonly known as: PERCOCET Take 1 tablet by mouth every 8 (eight) hours as needed for pain.   pantoprazole  40 MG tablet Commonly known as: PROTONIX  TAKE 1 TABLET(40 MG) BY MOUTH DAILY   phenazopyridine  100 MG tablet Commonly known as: PYRIDIUM  Take 1 tablet (100 mg total) by mouth 3 (three) times daily with meals.   rosuvastatin  40 MG tablet  Commonly known as: CRESTOR  Take 40 mg by mouth every evening.   silodosin  8 MG Caps capsule Commonly known as: RAPAFLO  TAKE 1 CAPSULE BY MOUTH TWICE DAILY   solifenacin  10 MG tablet Commonly known as: VESICARE  TAKE 1 TABLET(10 MG) BY MOUTH DAILY   sotalol  80 MG tablet Commonly known as: BETAPACE  Take 1 tablet (80 mg total) by mouth 2 (two) times daily. NEED OV.   Vitamin B 12 500 MCG Tabs Take 500 mcg by mouth in the morning.   Vitamin D  (Ergocalciferol ) 1.25 MG (50000 UNIT) Caps capsule Commonly known as: DRISDOL  TAKE 1 CAPSULE BY MOUTH EVERY 7 DAYS        Allergies:  Allergies  Allergen Reactions   Lactose Intolerance (Gi) Other (See Comments)    UPSET STOMACH    Trazodone  And Nefazodone     Nightmares    Family History  Problem Relation Age of Onset   Hypertension Mother        Cerebrovascular disease   Diabetes Mother    Coronary artery disease Father 15   Diabetes type II Father    Hypertension Father    Heart attack Father    Diabetes Brother    Hypertension Brother    Diabetes Sister    Hypertension Sister    Heart attack Sister 45   Leukemia Sister 89       CML   Breast cancer Maternal Grandmother        dx 41s   Lung cancer Maternal Uncle        dx >50, smoker   Cancer Cousin        mouth cancer, dx 59s, no smoking/chew tobacco hx (maternal 1st cousin)   Ovarian cancer Cousin        dx <50 (maternal 1st cousin)   Stomach cancer Cousin        dx 57s (maternal 1st cousin)   Cancer Cousin        type unknown to pt, dx >50 (paternal 1st cousin)    Social History:  reports that he quit  smoking about 11 years ago. His smoking use included cigarettes. He started smoking about 51 years ago. He has a 40 pack-year smoking history. He has never used smokeless tobacco. He reports that he does not currently use alcohol. He reports that he does not currently use drugs after having used the following drugs: Cocaine.  ROS: A complete review of systems was performed.  All systems are negative except for pertinent findings as noted.    Prior GU notes reviewed  I have also reviewed notes from referring/previous physicians--Dr. Charmain last note reviewed  I have independently reviewed prior imaging--bone density study from June 2023 revealed osteopenia  Urinalysis today shows a few red cells, no evidence of infection     "

## 2024-07-02 ENCOUNTER — Encounter (HOSPITAL_BASED_OUTPATIENT_CLINIC_OR_DEPARTMENT_OTHER): Admitting: Internal Medicine

## 2024-07-02 DIAGNOSIS — L598 Other specified disorders of the skin and subcutaneous tissue related to radiation: Secondary | ICD-10-CM | POA: Diagnosis not present

## 2024-07-02 DIAGNOSIS — N3041 Irradiation cystitis with hematuria: Secondary | ICD-10-CM

## 2024-07-02 DIAGNOSIS — Z8546 Personal history of malignant neoplasm of prostate: Secondary | ICD-10-CM | POA: Diagnosis not present

## 2024-07-02 LAB — MICROSCOPIC EXAMINATION: Bacteria, UA: NONE SEEN

## 2024-07-02 LAB — URINALYSIS, ROUTINE W REFLEX MICROSCOPIC
Bilirubin, UA: NEGATIVE
Ketones, UA: NEGATIVE
Leukocytes,UA: NEGATIVE
Nitrite, UA: NEGATIVE
Specific Gravity, UA: 1.02 (ref 1.005–1.030)
Urobilinogen, Ur: 1 mg/dL (ref 0.2–1.0)
pH, UA: 6 (ref 5.0–7.5)

## 2024-07-02 LAB — GLUCOSE, CAPILLARY
Glucose-Capillary: 136 mg/dL — ABNORMAL HIGH (ref 70–99)
Glucose-Capillary: 155 mg/dL — ABNORMAL HIGH (ref 70–99)
Glucose-Capillary: 112 mg/dL — ABNORMAL HIGH (ref 70–99)

## 2024-07-02 MED ORDER — SOTALOL HCL 80 MG PO TABS: 80.0000 mg | ORAL_TABLET | Freq: Two times a day (BID) | ORAL | 2 refills | Status: AC

## 2024-07-02 NOTE — Telephone Encounter (Signed)
 In accordance with refill protocols, please review and address the following requirements before this medication refill can be authorized:  EKG  and Labs

## 2024-07-03 ENCOUNTER — Encounter (HOSPITAL_BASED_OUTPATIENT_CLINIC_OR_DEPARTMENT_OTHER): Admitting: Internal Medicine

## 2024-07-03 ENCOUNTER — Other Ambulatory Visit: Payer: Self-pay | Admitting: Student

## 2024-07-03 DIAGNOSIS — L598 Other specified disorders of the skin and subcutaneous tissue related to radiation: Secondary | ICD-10-CM

## 2024-07-03 DIAGNOSIS — N3041 Irradiation cystitis with hematuria: Secondary | ICD-10-CM | POA: Diagnosis not present

## 2024-07-03 DIAGNOSIS — Z8546 Personal history of malignant neoplasm of prostate: Secondary | ICD-10-CM

## 2024-07-03 LAB — GLUCOSE, CAPILLARY
Glucose-Capillary: 108 mg/dL — ABNORMAL HIGH (ref 70–99)
Glucose-Capillary: 145 mg/dL — ABNORMAL HIGH (ref 70–99)
Glucose-Capillary: 117 mg/dL — ABNORMAL HIGH (ref 70–99)

## 2024-07-04 ENCOUNTER — Inpatient Hospital Stay: Attending: Hematology

## 2024-07-04 VITALS — BP 127/73 | HR 58 | Temp 98.0°F | Resp 18

## 2024-07-04 DIAGNOSIS — Z79899 Other long term (current) drug therapy: Secondary | ICD-10-CM | POA: Diagnosis not present

## 2024-07-04 DIAGNOSIS — D649 Anemia, unspecified: Secondary | ICD-10-CM | POA: Diagnosis not present

## 2024-07-04 DIAGNOSIS — M8580 Other specified disorders of bone density and structure, unspecified site: Secondary | ICD-10-CM | POA: Diagnosis not present

## 2024-07-04 DIAGNOSIS — K921 Melena: Secondary | ICD-10-CM | POA: Insufficient documentation

## 2024-07-04 DIAGNOSIS — C61 Malignant neoplasm of prostate: Secondary | ICD-10-CM | POA: Diagnosis present

## 2024-07-04 DIAGNOSIS — K649 Unspecified hemorrhoids: Secondary | ICD-10-CM | POA: Diagnosis not present

## 2024-07-04 MED ORDER — FAMOTIDINE IN NACL 20-0.9 MG/50ML-% IV SOLN
20.0000 mg | Freq: Once | INTRAVENOUS | Status: AC
  Administered 2024-07-04: 20 mg via INTRAVENOUS
  Filled 2024-07-04: qty 50

## 2024-07-04 MED ORDER — METHYLPREDNISOLONE SODIUM SUCC 125 MG IJ SOLR
125.0000 mg | Freq: Once | INTRAMUSCULAR | Status: AC
  Administered 2024-07-04: 125 mg via INTRAVENOUS
  Filled 2024-07-04: qty 2

## 2024-07-04 MED ORDER — CETIRIZINE HCL 10 MG/ML IV SOLN
10.0000 mg | Freq: Once | INTRAVENOUS | Status: AC
  Administered 2024-07-04: 10 mg via INTRAVENOUS
  Filled 2024-07-04: qty 1

## 2024-07-04 MED ORDER — SODIUM CHLORIDE 0.9 % IV SOLN
1000.0000 mg | Freq: Once | INTRAVENOUS | Status: AC
  Administered 2024-07-04: 1000 mg via INTRAVENOUS
  Filled 2024-07-04: qty 20

## 2024-07-04 MED ORDER — SODIUM CHLORIDE 0.9 % IV SOLN: INTRAVENOUS | Status: DC

## 2024-07-04 MED ORDER — ACETAMINOPHEN 325 MG PO TABS
650.0000 mg | ORAL_TABLET | Freq: Once | ORAL | Status: AC
  Administered 2024-07-04: 650 mg via ORAL
  Filled 2024-07-04: qty 2

## 2024-07-04 NOTE — Patient Instructions (Signed)
 [facility] - A DEPT OF [facility]  Discharge Instructions: Thank you for choosing [facility] to provide your oncology and hematology care.  If you have a lab appointment with the Cancer Center - please note that after April 8th, 2024, all labs will be drawn in the cancer center.  You do not have to check in or register with the main entrance as you have in the past but will complete your check-in in the cancer center.  Wear comfortable clothing and clothing appropriate for easy access to any Portacath or PICC line.   We strive to give you quality time with your provider. You may need to reschedule your appointment if you arrive late (15 or more minutes).  Arriving late affects you and other patients whose appointments are after yours.  Also, if you miss three or more appointments without notifying the office, you may be dismissed from the clinic at the providers discretion.      For prescription refill requests, have your pharmacy contact our office and allow 72 hours for refills to be completed.    Today you received the following Infed  iron  infusion.  Iron  Dextran Injection What is this medication? IRON  DEXTRAN (EYE ern DEX tran) treats low levels of iron  in your body. Iron  is a mineral that plays an important role in making red blood cells, which carry oxygen  from your lungs to the rest of your body. This medicine may be used for other purposes; ask your health care provider or pharmacist if you have questions. COMMON BRAND NAME(S): Dexferrum, INFeD  What should I tell my care team before I take this medication? They need to know if you have any of these conditions: Anemia not caused by low iron  levels Heart disease High levels of iron  in the blood Kidney disease Liver disease An unusual or allergic reaction to iron , other medications, foods, dyes, or preservatives Pregnant or trying to get pregnant Breastfeeding How should I use this  medication? This medication is injected into a vein or a muscle. It is given by your care team in a hospital or clinic setting. Talk to your care team about the use of this medication in children. While it may be prescribed for children as young as 4 months for selected conditions, precautions do apply. Overdosage: If you think you have taken too much of this medicine contact a poison control center or emergency room at once. NOTE: This medicine is only for you. Do not share this medicine with others. What if I miss a dose? Keep appointments for follow-up doses. It is important not to miss your dose. Call your care team if you are unable to keep an appointment. What may interact with this medication? Do not take this medication with any of the following: Deferoxamine Dimercaprol Other iron  products This medication may also interact with the following: Chloramphenicol Deferasirox This list may not describe all possible interactions. Give your health care provider a list of all the medicines, herbs, non-prescription drugs, or dietary supplements you use. Also tell them if you smoke, drink alcohol, or use illegal drugs. Some items may interact with your medicine. What should I watch for while using this medication? Your condition will be monitored carefully while you are receiving this medication. Tell your care team if your symptoms do not start to get better or if they get worse. You may need blood work done while you are taking this medication. Sometimes, when medications are infused  into veins, a little can leak out of the vein and into the tissue around it. If this medication leaks, it can cause a brown or dark stain on the skin. This is not common. It may be permanent. If you feel pain or swelling during your infusion, tell your care team right away. They can stop the infusion and treat the area. You may need to eat more foods that contain iron . Talk to your care team. Foods that contain iron   include whole grains or cereals, dried fruits, beans, peas, leafy green vegetables, and organ meats (liver, kidney). What side effects may I notice from receiving this medication? Side effects that you should report to your care team as soon as possible: Allergic reactions--skin rash, itching, hives, swelling of the face, lips, tongue, or throat Low blood pressure--dizziness, feeling faint or lightheaded, blurry vision Painful swelling, warmth, or redness of the skin, brown or dark skin color at the infusion site Shortness of breath Side effects that usually do not require medical attention (report these to your care team if they continue or are bothersome): Flushing Headache Joint pain Muscle pain Nausea This list may not describe all possible side effects. Call your doctor for medical advice about side effects. You may report side effects to FDA at 1-800-FDA-1088. Where should I keep my medication? This medication is given in a hospital or clinic. It will not be stored at home. NOTE: This sheet is a summary. It may not cover all possible information. If you have questions about this medicine, talk to your doctor, pharmacist, or health care provider.  2025 Elsevier/Gold Standard (2024-04-09 00:00:00)   To help prevent nausea and vomiting after your treatment, we encourage you to take your nausea medication as directed.  BELOW ARE SYMPTOMS THAT SHOULD BE REPORTED IMMEDIATELY: *FEVER GREATER THAN 100.4 F (38 C) OR HIGHER *CHILLS OR SWEATING *NAUSEA AND VOMITING THAT IS NOT CONTROLLED WITH YOUR NAUSEA MEDICATION *UNUSUAL SHORTNESS OF BREATH *UNUSUAL BRUISING OR BLEEDING *URINARY PROBLEMS (pain or burning when urinating, or frequent urination) *BOWEL PROBLEMS (unusual diarrhea, constipation, pain near the anus) TENDERNESS IN MOUTH AND THROAT WITH OR WITHOUT PRESENCE OF ULCERS (sore throat, sores in mouth, or a toothache) UNUSUAL RASH, SWELLING OR PAIN  UNUSUAL VAGINAL DISCHARGE OR  ITCHING   Items with * indicate a potential emergency and should be followed up as soon as possible or go to the Emergency Department if any problems should occur.  Please show the CHEMOTHERAPY ALERT CARD or IMMUNOTHERAPY ALERT CARD at check-in to the Emergency Department and triage nurse.  Should you have questions after your visit or need to cancel or reschedule your appointment, please contact [facility] [place] - A DEPT OF [facility] [phone]  and follow the prompts.  Office hours are 8:00 a.m. to 4:30 p.m. Monday - Friday. Please note that voicemails left after 4:00 p.m. may not be returned until the following business day.  We are closed weekends and major holidays. You have access to a nurse at all times for urgent questions. Please call the main number to the clinic [phone] and follow the prompts.  For any non-urgent questions, you may also contact your provider using MyChart. We now offer e-Visits for anyone 12 and older to request care online for non-urgent symptoms. For details visit [email or web address].   Also download the MyChart app! Go to the app store, search MyChart, open the app, select [facility], and log in with your MyChart username and password.

## 2024-07-04 NOTE — Progress Notes (Signed)
 Patient presents today for iron  infusion of Infed .  Patient is in satisfactory condition with no new complaints voiced.  Vital signs are stable.  We will proceed with infusion per provider orders.    Patient tolerated treatment well with no complaints voiced.  Patient left ambulatory in stable condition.  Vital signs stable at discharge.  Follow up as scheduled.

## 2024-07-08 ENCOUNTER — Encounter (HOSPITAL_BASED_OUTPATIENT_CLINIC_OR_DEPARTMENT_OTHER): Admitting: Internal Medicine

## 2024-07-08 DIAGNOSIS — L598 Other specified disorders of the skin and subcutaneous tissue related to radiation: Secondary | ICD-10-CM | POA: Diagnosis not present

## 2024-07-08 DIAGNOSIS — N3041 Irradiation cystitis with hematuria: Secondary | ICD-10-CM | POA: Diagnosis not present

## 2024-07-08 DIAGNOSIS — Z8546 Personal history of malignant neoplasm of prostate: Secondary | ICD-10-CM

## 2024-07-08 LAB — GLUCOSE, CAPILLARY
Glucose-Capillary: 134 mg/dL — ABNORMAL HIGH (ref 70–99)
Glucose-Capillary: 92 mg/dL (ref 70–99)

## 2024-07-08 MED ORDER — ROSUVASTATIN CALCIUM 40 MG PO TABS: 40.0000 mg | ORAL_TABLET | Freq: Every evening | ORAL | 2 refills | Status: AC

## 2024-07-08 NOTE — Telephone Encounter (Signed)
 Lipid Completed on 04/25/24

## 2024-07-09 ENCOUNTER — Encounter (HOSPITAL_BASED_OUTPATIENT_CLINIC_OR_DEPARTMENT_OTHER): Admitting: Internal Medicine

## 2024-07-09 LAB — GLUCOSE, CAPILLARY
Glucose-Capillary: 143 mg/dL — ABNORMAL HIGH (ref 70–99)
Glucose-Capillary: 135 mg/dL — ABNORMAL HIGH (ref 70–99)

## 2024-07-22 ENCOUNTER — Ambulatory Visit: Admitting: Urology

## 2024-08-06 ENCOUNTER — Ambulatory Visit: Payer: Medicare Other

## 2024-08-27 ENCOUNTER — Ambulatory Visit: Admitting: Nurse Practitioner

## 2024-10-31 ENCOUNTER — Ambulatory Visit: Admitting: Urology

## 2024-11-05 ENCOUNTER — Ambulatory Visit: Payer: Medicare Other

## 2024-11-12 ENCOUNTER — Inpatient Hospital Stay

## 2024-11-19 ENCOUNTER — Inpatient Hospital Stay

## 2024-11-19 ENCOUNTER — Inpatient Hospital Stay: Admitting: Oncology
